# Patient Record
Sex: Male | Born: 1985 | Race: Black or African American | Hispanic: No | Marital: Single | Smoking: Current every day smoker
Health system: Southern US, Community
[De-identification: ages and names within clinical notes are randomized; demographics above are authoritative.]

## PROBLEM LIST (undated history)

## (undated) ENCOUNTER — Emergency Department (HOSPITAL_COMMUNITY): Payer: Self-pay

## (undated) ENCOUNTER — Emergency Department (HOSPITAL_COMMUNITY): Admission: EM | Payer: Self-pay | Source: Home / Self Care

## (undated) DIAGNOSIS — U071 COVID-19: Secondary | ICD-10-CM

## (undated) DIAGNOSIS — F22 Delusional disorders: Secondary | ICD-10-CM

## (undated) DIAGNOSIS — Z765 Malingerer [conscious simulation]: Secondary | ICD-10-CM

## (undated) DIAGNOSIS — Z5900 Homelessness unspecified: Secondary | ICD-10-CM

## (undated) DIAGNOSIS — F99 Mental disorder, not otherwise specified: Secondary | ICD-10-CM

## (undated) DIAGNOSIS — I2699 Other pulmonary embolism without acute cor pulmonale: Secondary | ICD-10-CM

## (undated) DIAGNOSIS — E7800 Pure hypercholesterolemia, unspecified: Secondary | ICD-10-CM

## (undated) DIAGNOSIS — R443 Hallucinations, unspecified: Secondary | ICD-10-CM

## (undated) DIAGNOSIS — F209 Schizophrenia, unspecified: Secondary | ICD-10-CM

---
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
## 2009-07-23 ENCOUNTER — Emergency Department (HOSPITAL_COMMUNITY): Admission: EM | Admit: 2009-07-23 | Discharge: 2009-07-23 | Payer: Self-pay | Admitting: Emergency Medicine

## 2009-08-06 ENCOUNTER — Emergency Department (HOSPITAL_COMMUNITY): Admission: EM | Admit: 2009-08-06 | Discharge: 2009-08-06 | Payer: Self-pay | Admitting: Emergency Medicine

## 2009-08-18 ENCOUNTER — Emergency Department (HOSPITAL_COMMUNITY): Admission: EM | Admit: 2009-08-18 | Discharge: 2009-08-18 | Payer: Self-pay | Admitting: Family Medicine

## 2010-07-22 ENCOUNTER — Emergency Department (HOSPITAL_BASED_OUTPATIENT_CLINIC_OR_DEPARTMENT_OTHER): Admission: EM | Admit: 2010-07-22 | Discharge: 2010-07-22 | Payer: Self-pay | Admitting: Emergency Medicine

## 2010-07-24 ENCOUNTER — Emergency Department (HOSPITAL_COMMUNITY): Admission: EM | Admit: 2010-07-24 | Discharge: 2010-07-24 | Payer: Self-pay | Admitting: Emergency Medicine

## 2011-02-11 LAB — URINALYSIS, ROUTINE W REFLEX MICROSCOPIC
Bilirubin Urine: NEGATIVE
Glucose, UA: NEGATIVE mg/dL
Hgb urine dipstick: NEGATIVE
Ketones, ur: NEGATIVE mg/dL
Nitrite: NEGATIVE
Protein, ur: NEGATIVE mg/dL
Specific Gravity, Urine: 1.029 (ref 1.005–1.030)
Urobilinogen, UA: 0.2 mg/dL (ref 0.0–1.0)
pH: 6 (ref 5.0–8.0)

## 2011-02-11 LAB — POCT TOXICOLOGY PANEL
Opiates: POSITIVE
Tetrahydrocannabinol: POSITIVE

## 2011-02-11 LAB — CBC
HCT: 39.6 % (ref 39.0–52.0)
Hemoglobin: 13.3 g/dL (ref 13.0–17.0)
MCH: 29 pg (ref 26.0–34.0)
MCHC: 33.5 g/dL (ref 30.0–36.0)
MCV: 86.8 fL (ref 78.0–100.0)
Platelets: 264 10*3/uL (ref 150–400)
RBC: 4.57 MIL/uL (ref 4.22–5.81)
RDW: 12.1 % (ref 11.5–15.5)
WBC: 4.1 10*3/uL (ref 4.0–10.5)

## 2011-02-11 LAB — COMPREHENSIVE METABOLIC PANEL
ALT: 24 U/L (ref 0–53)
AST: 23 U/L (ref 0–37)
Albumin: 4.1 g/dL (ref 3.5–5.2)
Alkaline Phosphatase: 53 U/L (ref 39–117)
BUN: 10 mg/dL (ref 6–23)
CO2: 22 mEq/L (ref 19–32)
Calcium: 8.7 mg/dL (ref 8.4–10.5)
Chloride: 109 mEq/L (ref 96–112)
Creatinine, Ser: 1.1 mg/dL (ref 0.4–1.5)
GFR calc Af Amer: >60 mL/min (ref >60)
GFR calc non Af Amer: >60 mL/min (ref >60)
Glucose, Bld: 90 mg/dL (ref 70–99)
Potassium: 4 mEq/L (ref 3.5–5.1)
Sodium: 143 mEq/L (ref 135–145)
Total Bilirubin: 0.6 mg/dL (ref 0.3–1.2)
Total Protein: 7.4 g/dL (ref 6.0–8.3)

## 2011-02-11 LAB — DIFFERENTIAL
Basophils Absolute: 0 10*3/uL (ref 0.0–0.1)
Basophils Relative: 1 % (ref 0–1)
Eosinophils Absolute: 0.2 10*3/uL (ref 0.0–0.7)
Eosinophils Relative: 5 % (ref 0–5)
Lymphocytes Relative: 48 % — ABNORMAL HIGH (ref 12–46)
Lymphs Abs: 2 10*3/uL (ref 0.7–4.0)
Monocytes Absolute: 0.6 10*3/uL (ref 0.1–1.0)
Monocytes Relative: 15 % — ABNORMAL HIGH (ref 3–12)
Neutro Abs: 1.3 10*3/uL — ABNORMAL LOW (ref 1.7–7.7)
Neutrophils Relative %: 31 % — ABNORMAL LOW (ref 43–77)

## 2014-01-01 ENCOUNTER — Encounter (HOSPITAL_COMMUNITY): Payer: Self-pay | Admitting: Emergency Medicine

## 2014-01-01 ENCOUNTER — Inpatient Hospital Stay (HOSPITAL_COMMUNITY)
Admission: EM | Admit: 2014-01-01 | Discharge: 2014-01-03 | DRG: 176 | Disposition: A | Discharge status: Home / Self Care | Payer: Medicaid Other | Attending: Family Medicine | Admitting: Family Medicine

## 2014-01-01 DIAGNOSIS — F17200 Nicotine dependence, unspecified, uncomplicated: Secondary | ICD-10-CM | POA: Diagnosis present

## 2014-01-01 DIAGNOSIS — I2699 Other pulmonary embolism without acute cor pulmonale: Secondary | ICD-10-CM

## 2014-01-01 DIAGNOSIS — R109 Unspecified abdominal pain: Secondary | ICD-10-CM | POA: Diagnosis present

## 2014-01-01 DIAGNOSIS — F1210 Cannabis abuse, uncomplicated: Secondary | ICD-10-CM | POA: Diagnosis present

## 2014-01-01 HISTORY — DX: Mental disorder, not otherwise specified: F99

## 2014-01-01 LAB — COMPREHENSIVE METABOLIC PANEL
ALBUMIN: 3.8 g/dL (ref 3.5–5.2)
ALK PHOS: 56 U/L (ref 39–117)
ALT: 19 U/L (ref 0–53)
AST: 19 U/L (ref 0–37)
BILIRUBIN TOTAL: 0.3 mg/dL (ref 0.3–1.2)
BUN: 10 mg/dL (ref 6–23)
CHLORIDE: 100 meq/L (ref 96–112)
CO2: 27 mEq/L (ref 19–32)
Calcium: 8.9 mg/dL (ref 8.4–10.5)
Creatinine, Ser: 1.02 mg/dL (ref 0.50–1.35)
GFR calc Af Amer: >90 mL/min (ref >90)
GFR calc non Af Amer: >90 mL/min (ref >90)
Glucose, Bld: 78 mg/dL (ref 70–99)
POTASSIUM: 4.1 meq/L (ref 3.7–5.3)
SODIUM: 141 meq/L (ref 137–147)
Total Protein: 7.7 g/dL (ref 6.0–8.3)

## 2014-01-01 LAB — CBC WITH DIFFERENTIAL/PLATELET
BASOS ABS: 0 10*3/uL (ref 0.0–0.1)
BASOS PCT: 1 % (ref 0–1)
Eosinophils Absolute: 0.2 10*3/uL (ref 0.0–0.7)
Eosinophils Relative: 4 % (ref 0–5)
HCT: 38.4 % — ABNORMAL LOW (ref 39.0–52.0)
HEMOGLOBIN: 13 g/dL (ref 13.0–17.0)
Lymphocytes Relative: 19 % (ref 12–46)
Lymphs Abs: 1.2 10*3/uL (ref 0.7–4.0)
MCH: 28.9 pg (ref 26.0–34.0)
MCHC: 33.9 g/dL (ref 30.0–36.0)
MCV: 85.3 fL (ref 78.0–100.0)
MONOS PCT: 13 % — AB (ref 3–12)
Monocytes Absolute: 0.8 10*3/uL (ref 0.1–1.0)
NEUTROS ABS: 4.1 10*3/uL (ref 1.7–7.7)
NEUTROS PCT: 64 % (ref 43–77)
PLATELETS: 275 10*3/uL (ref 150–400)
RBC: 4.5 MIL/uL (ref 4.22–5.81)
RDW: 12.4 % (ref 11.5–15.5)
WBC: 6.4 10*3/uL (ref 4.0–10.5)

## 2014-01-01 LAB — URINALYSIS, ROUTINE W REFLEX MICROSCOPIC
Bilirubin Urine: NEGATIVE
Glucose, UA: NEGATIVE mg/dL
Hgb urine dipstick: NEGATIVE
KETONES UR: NEGATIVE mg/dL
LEUKOCYTES UA: NEGATIVE
NITRITE: NEGATIVE
PH: 6 (ref 5.0–8.0)
Protein, ur: NEGATIVE mg/dL
SPECIFIC GRAVITY, URINE: 1.025 (ref 1.005–1.030)
Urobilinogen, UA: 1 mg/dL (ref 0.0–1.0)

## 2014-01-01 NOTE — ED Notes (Signed)
Pt reports L flank pain starting 2 hours ago; denies n/v/dysuria; started suddenly

## 2014-01-02 ENCOUNTER — Encounter (HOSPITAL_COMMUNITY): Payer: Self-pay | Admitting: Radiology

## 2014-01-02 ENCOUNTER — Emergency Department (HOSPITAL_COMMUNITY): Payer: Medicaid Other

## 2014-01-02 DIAGNOSIS — R109 Unspecified abdominal pain: Secondary | ICD-10-CM

## 2014-01-02 DIAGNOSIS — I2699 Other pulmonary embolism without acute cor pulmonale: Principal | ICD-10-CM

## 2014-01-02 DIAGNOSIS — R52 Pain, unspecified: Secondary | ICD-10-CM

## 2014-01-02 LAB — ANTITHROMBIN III: ANTITHROMB III FUNC: 102 % (ref 75–120)

## 2014-01-02 LAB — RAPID URINE DRUG SCREEN, HOSP PERFORMED
AMPHETAMINES: NOT DETECTED
BARBITURATES: NOT DETECTED
Benzodiazepines: NOT DETECTED
Cocaine: NOT DETECTED
Opiates: NOT DETECTED
TETRAHYDROCANNABINOL: POSITIVE — AB

## 2014-01-02 LAB — PROTIME-INR
INR: 0.96 (ref 0.00–1.49)
Prothrombin Time: 12.6 seconds (ref 11.6–15.2)

## 2014-01-02 LAB — HOMOCYSTEINE: HOMOCYSTEINE-NORM: 9.1 umol/L (ref 4.0–15.4)

## 2014-01-02 MED ORDER — RIVAROXABAN 15 MG PO TABS
15.0000 mg | ORAL_TABLET | Freq: Two times a day (BID) | ORAL | Status: DC
  Administered 2014-01-02 – 2014-01-03 (×2): 15 mg via ORAL
  Filled 2014-01-02 (×4): qty 1

## 2014-01-02 MED ORDER — HYDROMORPHONE HCL PF 1 MG/ML IJ SOLN
INTRAMUSCULAR | Status: AC
  Filled 2014-01-02: qty 1

## 2014-01-02 MED ORDER — ONDANSETRON HCL 4 MG/2ML IJ SOLN: 4.0000 mg | Freq: Three times a day (TID) | INTRAMUSCULAR | Status: DC | PRN

## 2014-01-02 MED ORDER — HYDROCODONE-ACETAMINOPHEN 5-325 MG PO TABS
1.0000 | ORAL_TABLET | ORAL | Status: DC | PRN
  Administered 2014-01-02 – 2014-01-03 (×5): 1 via ORAL
  Filled 2014-01-02 (×5): qty 1

## 2014-01-02 MED ORDER — SODIUM CHLORIDE 0.9 % IJ SOLN
3.0000 mL | Freq: Two times a day (BID) | INTRAMUSCULAR | Status: DC
  Administered 2014-01-02 (×2): 3 mL via INTRAVENOUS

## 2014-01-02 MED ORDER — SODIUM CHLORIDE 0.9 % IJ SOLN
3.0000 mL | Freq: Two times a day (BID) | INTRAMUSCULAR | Status: DC
  Administered 2014-01-02: 3 mL via INTRAVENOUS

## 2014-01-02 MED ORDER — ENOXAPARIN SODIUM 80 MG/0.8ML ~~LOC~~ SOLN
80.0000 mg | Freq: Once | SUBCUTANEOUS | Status: AC
  Administered 2014-01-02: 80 mg via SUBCUTANEOUS
  Filled 2014-01-02: qty 0.8

## 2014-01-02 MED ORDER — HYDROMORPHONE HCL PF 1 MG/ML IJ SOLN
1.0000 mg | Freq: Once | INTRAMUSCULAR | Status: AC
  Administered 2014-01-02: 1 mg via INTRAVENOUS

## 2014-01-02 MED ORDER — RIVAROXABAN 20 MG PO TABS: 20.0000 mg | ORAL_TABLET | Freq: Every day | ORAL | Status: DC

## 2014-01-02 MED ORDER — ENOXAPARIN SODIUM 80 MG/0.8ML ~~LOC~~ SOLN
1.0000 mg/kg | Freq: Two times a day (BID) | SUBCUTANEOUS | Status: DC
  Filled 2014-01-02: qty 0.8

## 2014-01-02 MED ORDER — OXYCODONE-ACETAMINOPHEN 5-325 MG PO TABS
2.0000 | ORAL_TABLET | Freq: Once | ORAL | Status: AC
  Administered 2014-01-02: 2 via ORAL
  Filled 2014-01-02: qty 2

## 2014-01-02 MED ORDER — IOHEXOL 350 MG/ML SOLN
80.0000 mL | Freq: Once | INTRAVENOUS | Status: AC | PRN
  Administered 2014-01-02: 74 mL via INTRAVENOUS

## 2014-01-02 MED ORDER — SODIUM CHLORIDE 0.9 % IJ SOLN: 3.0000 mL | INTRAMUSCULAR | Status: DC | PRN

## 2014-01-02 MED ORDER — SODIUM CHLORIDE 0.9 % IV SOLN: 250.0000 mL | INTRAVENOUS | Status: DC | PRN

## 2014-01-02 MED ORDER — BENZTROPINE MESYLATE 1 MG PO TABS
1.0000 mg | ORAL_TABLET | Freq: Every day | ORAL | Status: DC
  Administered 2014-01-02: 1 mg via ORAL
  Filled 2014-01-02 (×3): qty 1

## 2014-01-02 MED ORDER — HYDROMORPHONE HCL PF 1 MG/ML IJ SOLN: 1.0000 mg | INTRAMUSCULAR | Status: DC | PRN

## 2014-01-02 NOTE — Discharge Instructions (Signed)
Information on my medicine - XARELTO (rivaroxaban)  This medication education was reviewed with me or my healthcare representative as part of my discharge preparation.  The pharmacist that spoke with me during my hospital stay was:  Jo-Anne Kluth, Judie BonusKimberly Ballard, [facility]  WHY WAS XARELTO PRESCRIBED FOR YOU? Xarelto was prescribed to treat blood clots that may have been found in the veins of your legs (deep vein thrombosis) or in your lungs (pulmonary embolism) and to reduce the risk of them occurring again.  What do you need to know about Xarelto? The starting dose is one 15 mg tablet taken TWICE daily with food for the FIRST 21 DAYS then on (enter date)  01/24/14  the dose is changed to one 20 mg tablet taken ONCE A DAY with your evening meal.  DO NOT stop taking Xarelto without talking to the health care provider who prescribed the medication.  Refill your prescription for 20 mg tablets before you run out.  After discharge, you should have regular check-up appointments with your healthcare provider that is prescribing your Xarelto.  In the future your dose may need to be changed if your kidney function changes by a significant amount.  What do you do if you miss a dose? If you are taking Xarelto TWICE DAILY and you miss a dose, take it as soon as you remember. You may take two 15 mg tablets (total 30 mg) at the same time then resume your regularly scheduled 15 mg twice daily the next day.  If you are taking Xarelto ONCE DAILY and you miss a dose, take it as soon as you remember on the same day then continue your regularly scheduled once daily regimen the next day. Do not take two doses of Xarelto at the same time.   Important Safety Information Xarelto is a blood thinner medicine that can cause bleeding. You should call your healthcare provider right away if you experience any of the following:   Bleeding from an injury or your nose that does not stop.   Unusual colored urine (red or dark  brown) or unusual colored stools (red or black).   Unusual bruising for unknown reasons.   A serious fall or if you hit your head (even if there is no bleeding).  Some medicines may interact with Xarelto and might increase your risk of bleeding while on Xarelto. To help avoid this, consult your healthcare provider or pharmacist prior to using any new prescription or non-prescription medications, including herbals, vitamins, non-steroidal anti-inflammatory drugs (NSAIDs) and supplements.  This website has more information on Xarelto: [email or web address].[email or web address].

## 2014-01-02 NOTE — ED Notes (Signed)
Pt returned from radiology.

## 2014-01-02 NOTE — Care Management Note (Signed)
    Page 1 of 1   01/03/2014     2:27:55 PM   CARE MANAGEMENT NOTE 01/03/2014  Patient:  Cole Barrett,Cole Barrett   Account Number:  [id number]  Date Initiated:  01/02/2014  Documentation initiated by:  Nichalas Coin  Subjective/Objective Assessment:   PT ADM ON 01/01/14 WITH BILATERAL PULMONARY EMBOLI.  PTA, PT INDEPENDENT OF ADLS.     Action/Plan:   PT TO DC ON XARELTO.  HE HAS NO INSURANCE.  WILL GIVE 30 DAY FREE TRIAL CARD FOR XARELTO, AND ENROLL IN PATIENT ASSISTANCE PROGRAM.   Anticipated DC Date:  01/03/2014   Anticipated DC Plan:  HOME/SELF CARE      DC Planning Services  CM consult  Medication Assistance  [facility]  Follow-up appt scheduled      Choice offered to / List presented to:             Status of service:  Completed, signed off Medicare Important Message given?   (If response is "NO", the following Medicare IM given date fields will be blank) Date Medicare IM given:   Date Additional Medicare IM given:    Discharge Disposition:  HOME/SELF CARE  Per UR Regulation:  Reviewed for med. necessity/level of care/duration of stay  If discussed at Long Length of Stay Meetings, dates discussed:    Comments:  01/03/14 Tangi Shroff,RN,BSN [phone] PT STATES HE HAS ACTIVE MEDICAID, AND I HAVE VERIFIED THIS WITH FINANCIAL COUNSELOR.  PT WILL NOT NEED TO BE ENROLLED IN PATIENT ASSISTANCE PROGRAM; HE WILL BE ABLE TO OBTAIN XARELTO FOR $3 WITH MEDICAID BENEFITS.  PT ENCOURAGED TO STILL KEEP [facility] FOLLOW UP APPOINTMENT AS SCHEDULED.  PT GIVEN 30 DAY FREE TRIAL CARD FOR XARELTO AFTER ACTIVATING.  01/02/14 Treyvone Chelf,RN,BSN [phone] [facility]PT MADE AT [facility] FOR [facility], FEB 14 AT 10:00.

## 2014-01-02 NOTE — ED Notes (Signed)
Pt reports smoking marijuana yesterday.

## 2014-01-02 NOTE — ED Notes (Signed)
David, MD at bedside.  

## 2014-01-02 NOTE — Progress Notes (Signed)
Patient seen and evaluated earlier this a.m. by my associate. Found to have bilateral acute pulmonary embolisms including an occlusive segmental clot in the left lower lobe that causes lung infarct on CT angiogram.  Patient started on Lovenox. I have discussed Coumadin versus xarelto as option for treatment. Patient has opted for xarelto and is aware of the benefits as well as the risks of both medications. He currently does not have a primary care physician and states that it would be difficult for him to have periodic INR checks if on Coumadin.  We'll consult at pharmacy for xarelto administration.  Plan on discharging on xarelto most likely next a.m. to 01/03/2014

## 2014-01-02 NOTE — Progress Notes (Signed)
ANTICOAGULATION CONSULT NOTE - Initial Consult  Pharmacy Consult for Xarelto Indication: pulmonary embolus  No Known Allergies  Patient Measurements: Height: 5\' 9"  (175.3 cm) Weight: 179 lb (81.194 kg) IBW/kg (Calculated) : 70.7  Vital Signs: Temp: 98.9 F (37.2 C) (02/05 0603) Temp src: Oral (02/05 0603) BP: 109/63 mmHg (02/05 0603) Pulse Rate: 69 (02/05 0603)  Labs:  Recent Labs  01/01/14 2055 01/02/14 0429  HGB 13.0  --   HCT 38.4*  --   PLT 275  --   LABPROT  --  12.6  INR  --  0.96  CREATININE 1.02  --     Estimated Creatinine Clearance: 105.9 ml/min (by C-G formula based on Cr of 1.02).   Medical History: Past Medical History  Diagnosis Date  . Mental disorder     Medications:  Scheduled:  . enoxaparin (LOVENOX) injection  1 mg/kg Subcutaneous BID  . HYDROmorphone      . sodium chloride  3 mL Intravenous Q12H  . sodium chloride  3 mL Intravenous Q12H    Assessment: 28 yo M presented to the ED overnight with flank pain.  Found to have bilateral PE.  Pt denies recent trauma, surgery, travel, or any other known risk factors.  Concern for hypercoagulable state with labs drawn in ED and in process.  Pt was imitated on Lovenox 1mg /kg q12h now with plans to transition to Xarelto for PE treatment.  Goal of Therapy:  Therapeutic Anticoagulation   Plan:  D/C Lovenox now. Start Xarelto tonight with the evening meal.  Plan Xarelto 15mg  BID x 21 days, then 20mg  daily with dinner. Will follow-up for patient education as mental status improves.  Toys 'R' UsKimberly Shykeem Resurreccion, Pharm.D., BCPS Clinical Pharmacist Pager [phone] 01/02/2014 1:53 PM

## 2014-01-02 NOTE — ED Notes (Signed)
Steinl, MD at bedside. 

## 2014-01-02 NOTE — ED Provider Notes (Addendum)
CSN: [id number]     Arrival date & time 01/01/14  2045 History   First MD Initiated Contact with Patient 01/02/14 0031     Chief Complaint  Patient presents with  . Flank Pain   (Consider location/radiation/quality/duration/timing/severity/associated sxs/prior Treatment) Patient is a 28 y.o. male presenting with flank pain. The history is provided by the patient and the EMS personnel.  Flank Pain Pertinent negatives include no chest pain, no abdominal pain, no headaches and no shortness of breath.  pt c/o acute onset left flank pain at rest tonight. Constant, dull, mod-severe, non radiating. No hx same pain. No hx kidney stones. No dysuria or hematuria. Denies injury or strain to area. No fever or chills. Has had normal appetite. No nvd. No constipation.      History reviewed. No pertinent past medical history. History reviewed. No pertinent past surgical history. History reviewed. No pertinent family history. History  Substance Use Topics  . Smoking status: Current Every Day Smoker -- 0.30 packs/day    Types: Cigarettes  . Smokeless tobacco: Not on file  . Alcohol Use: Yes     Comment: 1 40 oz per day    Review of Systems  Constitutional: Negative for fever and chills.  HENT: Negative for sore throat.   Eyes: Negative for redness.  Respiratory: Negative for shortness of breath.   Cardiovascular: Negative for chest pain.  Gastrointestinal: Negative for vomiting, abdominal pain, diarrhea and constipation.  Genitourinary: Positive for flank pain. Negative for dysuria.  Musculoskeletal: Positive for back pain. Negative for neck pain.  Skin: Negative for rash.  Neurological: Negative for headaches.  Hematological: Does not bruise/bleed easily.  Psychiatric/Behavioral: Negative for confusion.    Allergies  Review of patient's allergies indicates no known allergies.  Home Medications   Current Outpatient Rx  Name  Route  Sig  Dispense  Refill  . PRESCRIPTION MEDICATION  Intramuscular   Inject into the muscle every 14 (fourteen) days. Receives shot fromReceiv Dr every 2 weeks for "voices in head"          BP 125/74  Pulse 72  Temp(Src) 98.4 F (36.9 C) (Oral)  Resp 22  SpO2 100% Physical Exam  Nursing note and vitals reviewed. Constitutional: He is oriented to person, place, and time. He appears well-developed and well-nourished. No distress.  HENT:  Head: Atraumatic.  Mouth/Throat: Oropharynx is clear and moist.  Eyes: Conjunctivae are normal. No scleral icterus.  Neck: Neck supple. No tracheal deviation present.  Cardiovascular: Normal rate, regular rhythm, normal heart sounds and intact distal pulses.   Pulmonary/Chest: Effort normal and breath sounds normal. No accessory muscle usage. No respiratory distress.  Abdominal: Soft. Bowel sounds are normal. He exhibits no distension and no mass. There is no tenderness. There is no rebound and no guarding.  Genitourinary:  No cva tenderness  Musculoskeletal: Normal range of motion. He exhibits no edema and no tenderness.  CTLS spine, non tender, aligned, no step off.   Neurological: He is alert and oriented to person, place, and time.  Skin: Skin is warm and dry.  Psychiatric: He has a normal mood and affect.    ED Course  Procedures (including critical care time)   Results for orders placed during the hospital encounter of 01/01/14  URINALYSIS, ROUTINE W REFLEX MICROSCOPIC      Result Value Range   Color, Urine YELLOW  YELLOW   APPearance CLEAR  CLEAR   Specific Gravity, Urine 1.025  1.005 - 1.030   pH 6.0  5.0 - 8.0   Glucose, UA NEGATIVE  NEGATIVE mg/dL   Hgb urine dipstick NEGATIVE  NEGATIVE   Bilirubin Urine NEGATIVE  NEGATIVE   Ketones, ur NEGATIVE  NEGATIVE mg/dL   Protein, ur NEGATIVE  NEGATIVE mg/dL   Urobilinogen, UA 1.0  0.0 - 1.0 mg/dL   Nitrite NEGATIVE  NEGATIVE   Leukocytes, UA NEGATIVE  NEGATIVE  CBC WITH DIFFERENTIAL      Result Value Range   WBC 6.4  4.0 - 10.5  K/uL   RBC 4.50  4.22 - 5.81 MIL/uL   Hemoglobin 13.0  13.0 - 17.0 g/dL   HCT 16.1 (*) 09.6 - 04.5 %   MCV 85.3  78.0 - 100.0 fL   MCH 28.9  26.0 - 34.0 pg   MCHC 33.9  30.0 - 36.0 g/dL   RDW 40.9  81.1 - 91.4 %   Platelets 275  150 - 400 K/uL   Neutrophils Relative % 64  43 - 77 %   Neutro Abs 4.1  1.7 - 7.7 K/uL   Lymphocytes Relative 19  12 - 46 %   Lymphs Abs 1.2  0.7 - 4.0 K/uL   Monocytes Relative 13 (*) 3 - 12 %   Monocytes Absolute 0.8  0.1 - 1.0 K/uL   Eosinophils Relative 4  0 - 5 %   Eosinophils Absolute 0.2  0.0 - 0.7 K/uL   Basophils Relative 1  0 - 1 %   Basophils Absolute 0.0  0.0 - 0.1 K/uL  COMPREHENSIVE METABOLIC PANEL      Result Value Range   Sodium 141  137 - 147 mEq/L   Potassium 4.1  3.7 - 5.3 mEq/L   Chloride 100  96 - 112 mEq/L   CO2 27  19 - 32 mEq/L   Glucose, Bld 78  70 - 99 mg/dL   BUN 10  6 - 23 mg/dL   Creatinine, Ser 7.82  0.50 - 1.35 mg/dL   Calcium 8.9  8.4 - 95.6 mg/dL   Total Protein 7.7  6.0 - 8.3 g/dL   Albumin 3.8  3.5 - 5.2 g/dL   AST 19  0 - 37 U/L   ALT 19  0 - 53 U/L   Alkaline Phosphatase 56  39 - 117 U/L   Total Bilirubin 0.3  0.3 - 1.2 mg/dL   GFR calc non Af Amer >90  >90 mL/min   GFR calc Af Amer >90  >90 mL/min  URINE RAPID DRUG SCREEN (HOSP PERFORMED)      Result Value Range   Opiates NONE DETECTED  NONE DETECTED   Cocaine NONE DETECTED  NONE DETECTED   Benzodiazepines NONE DETECTED  NONE DETECTED   Amphetamines NONE DETECTED  NONE DETECTED   Tetrahydrocannabinol POSITIVE (*) NONE DETECTED   Barbiturates NONE DETECTED  NONE DETECTED   Ct Abdomen Pelvis Wo Contrast  01/02/2014   CLINICAL DATA:  Left flank pain starting 2 hr ago  EXAM: CT ABDOMEN AND PELVIS WITHOUT CONTRAST  TECHNIQUE: Multidetector CT imaging of the abdomen and pelvis was performed following the standard protocol without intravenous contrast.  COMPARISON:  None.  FINDINGS: BODY WALL: Unremarkable.  LOWER CHEST: Focal rounded opacity in the posterior  basilar segment left lower lobe, with pleural thickening or small volume pleural effusion.  ABDOMEN/PELVIS:  Liver: No focal abnormality.  Biliary: No evidence of biliary obstruction or stone.  Pancreas: Unremarkable.  Spleen: Unremarkable.  Adrenals: Unremarkable.  Kidneys and ureters: No hydronephrosis or stone.  Bladder: Unremarkable.  Reproductive: Unremarkable.  Bowel: No obstruction. Appendix not clearly resolved, but there is no pericecal inflammation.  Retroperitoneum: No mass or adenopathy.  Peritoneum: No free fluid or gas.  Vascular: No acute abnormality.  OSSEOUS: No acute abnormalities.  IMPRESSION: 1. Pleural based airspace disease in the left lower lobe. If there are pulmonary infectious symptoms, the finding is consistent with pneumonia. If not, lung infarct should then considered. 2. Negative intra-abdominal imaging.   Electronically Signed   By: Tiburcio PeaJonathan  Watts M.D.   On: 01/02/2014 01:32   Ct Angio Chest Pe W/cm &/or Wo Cm  01/02/2014   CLINICAL DATA:  Abnormal abdominal CT.  Rule out pulmonary embolism.  EXAM: CT ANGIOGRAPHY CHEST WITH CONTRAST  TECHNIQUE: Multidetector CT imaging of the chest was performed using the standard protocol during bolus administration of intravenous contrast. Multiplanar CT image reconstructions including MIPs were obtained to evaluate the vascular anatomy.  CONTRAST:  74mL OMNIPAQUE IOHEXOL 350 MG/ML SOLN  COMPARISON:  Abdominal CT from the same day.  FINDINGS: THORACIC INLET/BODY WALL:  No acute abnormality.  MEDIASTINUM:  There is an occlusive thrombus in the proximal posterior basilar segment left lower lobe pulmonary artery, with downstream lung infarct. There is a contralateral embolus in the sub segmental medial segment right middle lobe, nonocclusive.  No evidence of right heart strain. No cardiomegaly and/or pericardial effusion. Negative aorta.  LUNG WINDOWS:  Lung infarct at the left base.  Mild paraseptal emphysema.  UPPER ABDOMEN:  No acute findings.   OSSEOUS:  No acute fracture.  No suspicious lytic or blastic lesions.  Critical Value/emergent results were called by telephone at the time of interpretation on 01/02/2014 at 3:27 AM to Dr. Cathren LaineKEVIN Varonica Siharath , who verbally acknowledged these results.  IMPRESSION: Bilateral acute pulmonary embolism, including an occlusive segmental clot in the left lower lobe that causes lung infarct.   Electronically Signed   By: Tiburcio PeaJonathan  Watts M.D.   On: 01/02/2014 03:29      MDM  Percocet po. Labs. Ct.  Reviewed nursing notes and prior charts for additional history.   Ct positive for pe.   Med service called to admit.  Pt c/o persistent, severe pain left flank.  Dilaudid iv.  Pt w no hx, no risk factors for pe, no travel, immobility, surgery, trauma. Hypercoagulable panel added to labs.   lovenox per pharmacy.        Suzi RootsKevin E Mylisa Brunson, MD 01/02/14 [phone]

## 2014-01-02 NOTE — Progress Notes (Signed)
Utilization Review Completed.Cole Barrett T2/03/2014  

## 2014-01-02 NOTE — ED Notes (Signed)
Pt called out stating that he has further questions for the doctor.

## 2014-01-02 NOTE — ED Notes (Signed)
Patient transported to CT 

## 2014-01-02 NOTE — Progress Notes (Signed)
RN went in to assess pt. Pt had family at bedside. Family wondering what pt was admitted for. Family informed RN that this is pts baseline confused and forgetful. Family stated pt has "schizophrenia and hears voices".  Family informed RN that pt gets a Risperdal injection every 2 weeks and takes and takes another medication daily. RN notified pharmacy and pharmacy is coming to talk in more detail about patients home medications with family. Family requesting to speak to MD in the am to get more clarity on patients plan. Family also voices concern about pts ability to make decisions independently. Will continue to monitor.   Genevive Biavis, Arlie Posch R, RN

## 2014-01-02 NOTE — H&P (Signed)
Chief Complaint:  Left flank pain  HPI: 28 yo male healthy per his report had sudden onset of left flank pain earlier tonight which was moderate, came to ED.  No fevers.  No cough.  No le edema or swelling.  No recent illnesses.  No recent trauma, traveling, or surgery.  Ct abd pel was done initially concerns for renal stone but that study showed pulmonary infarct.  Subsequently a cta chest was done which shows bilateral pulmonary emboli with segmental LLL infarct.    Review of Systems:  Positive and negative as per HPI otherwise all other systems are negative  Past Medical History: History reviewed. No pertinent past medical history. History reviewed. No pertinent past surgical history.  Medications: Prior to Admission medications   Medication Sig Start Date End Date Taking? Authorizing Provider  PRESCRIPTION MEDICATION Inject into the muscle every 14 (fourteen) days. Receives shot fromReceiv Dr every 2 weeks for "voices in head"   Yes Historical Provider, MD    Allergies:  No Known Allergies  Social History:  reports that he has been smoking Cigarettes.  He has been smoking about 0.30 packs per day. He does not have any smokeless tobacco history on file. He reports that he drinks alcohol. He reports that he uses illicit drugs (Marijuana).  Family History: Brother at age 49 years old died suddenly of heart attack, he did not use drugs, pt reports there was an autopsy done which confirmed a myocardial infarct as cause of death but he is not sure of this.  Physical Exam: Filed Vitals:   01/02/14 0230 01/02/14 0300 01/02/14 0416 01/02/14 0426  BP: 125/70   111/67  Pulse: 72   67  Temp:      TempSrc:      Resp:      Height:  5\' 9"  (1.753 m) 5\' 9"  (1.753 m)   Weight:  81.647 kg (180 lb) 81.647 kg (180 lb)   SpO2: 99%   100%   General appearance: alert, cooperative and no distress Head: Normocephalic, without obvious abnormality, atraumatic Eyes: negative Nose: Nares normal.  Septum midline. Mucosa normal. No drainage or sinus tenderness. Neck: no JVD and supple, symmetrical, trachea midline Lungs: clear to auscultation bilaterally Heart: regular rate and rhythm, S1, S2 normal, no murmur, click, rub or gallop Abdomen: soft, non-tender; bowel sounds normal; no masses,  no organomegaly Extremities: extremities normal, atraumatic, no cyanosis or edema Pulses: 2+ and symmetric Skin: Skin color, texture, turgor normal. No rashes or lesions Neurologic: Grossly normal    Labs on Admission:   Recent Labs  01/01/14 2055  NA 141  K 4.1  CL 100  CO2 27  GLUCOSE 78  BUN 10  CREATININE 1.02  CALCIUM 8.9    Recent Labs  01/01/14 2055  AST 19  ALT 19  ALKPHOS 56  BILITOT 0.3  PROT 7.7  ALBUMIN 3.8    Recent Labs  01/01/14 2055  WBC 6.4  NEUTROABS 4.1  HGB 13.0  HCT 38.4*  MCV 85.3  PLT 275    Radiological Exams on Admission: Ct Abdomen Pelvis Wo Contrast  01/02/2014   CLINICAL DATA:  Left flank pain starting 2 hr ago  EXAM: CT ABDOMEN AND PELVIS WITHOUT CONTRAST  TECHNIQUE: Multidetector CT imaging of the abdomen and pelvis was performed following the standard protocol without intravenous contrast.  COMPARISON:  None.  FINDINGS: BODY WALL: Unremarkable.  LOWER CHEST: Focal rounded opacity in the posterior basilar segment left lower lobe, with pleural thickening or  small volume pleural effusion.  ABDOMEN/PELVIS:  Liver: No focal abnormality.  Biliary: No evidence of biliary obstruction or stone.  Pancreas: Unremarkable.  Spleen: Unremarkable.  Adrenals: Unremarkable.  Kidneys and ureters: No hydronephrosis or stone.  Bladder: Unremarkable.  Reproductive: Unremarkable.  Bowel: No obstruction. Appendix not clearly resolved, but there is no pericecal inflammation.  Retroperitoneum: No mass or adenopathy.  Peritoneum: No free fluid or gas.  Vascular: No acute abnormality.  OSSEOUS: No acute abnormalities.  IMPRESSION: 1. Pleural based airspace disease in the  left lower lobe. If there are pulmonary infectious symptoms, the finding is consistent with pneumonia. If not, lung infarct should then considered. 2. Negative intra-abdominal imaging.   Electronically Signed   By: Tiburcio PeaJonathan  Watts M.D.   On: 01/02/2014 01:32   Ct Angio Chest Pe W/cm &/or Wo Cm  01/02/2014   CLINICAL DATA:  Abnormal abdominal CT.  Rule out pulmonary embolism.  EXAM: CT ANGIOGRAPHY CHEST WITH CONTRAST  TECHNIQUE: Multidetector CT imaging of the chest was performed using the standard protocol during bolus administration of intravenous contrast. Multiplanar CT image reconstructions including MIPs were obtained to evaluate the vascular anatomy.  CONTRAST:  74mL OMNIPAQUE IOHEXOL 350 MG/ML SOLN  COMPARISON:  Abdominal CT from the same day.  FINDINGS: THORACIC INLET/BODY WALL:  No acute abnormality.  MEDIASTINUM:  There is an occlusive thrombus in the proximal posterior basilar segment left lower lobe pulmonary artery, with downstream lung infarct. There is a contralateral embolus in the sub segmental medial segment right middle lobe, nonocclusive.  No evidence of right heart strain. No cardiomegaly and/or pericardial effusion. Negative aorta.  LUNG WINDOWS:  Lung infarct at the left base.  Mild paraseptal emphysema.  UPPER ABDOMEN:  No acute findings.  OSSEOUS:  No acute fracture.  No suspicious lytic or blastic lesions.  Critical Value/emergent results were called by telephone at the time of interpretation on 01/02/2014 at 3:27 AM to Dr. Cathren LaineKEVIN STEINL , who verbally acknowledged these results.  IMPRESSION: Bilateral acute pulmonary embolism, including an occlusive segmental clot in the left lower lobe that causes lung infarct.   Electronically Signed   By: Tiburcio PeaJonathan  Watts M.D.   On: 01/02/2014 03:29    Assessment/Plan 28 yo male presents with left flank pain found to have bilateral pulmonary emboli and left lower lobe infarct appears unprovoked  Principal Problem:   Pulmonary embolism and  infarction-  Full dose lovenox.  Hypercoagulable w/u ordered by edp prior to lovenox administration.  The history of his brother dying at age 30 suddenly is suspicous for underlying hypercoagulable state.  Will need 6 mo to one year of anticoagulation unless hypercoag panel positive then will need lifelong tx.  Would be a good xaralto candidate if insurance present.  Will need to get SW involved to help with f/u at discharge.  Active Problems:   Acute left flank pain as above  Suspect some underlying psychiatric disorder, looking back in 2011 pt had IVC to inpatient psychiatric facility for acute psychosis, and on his med list he reports taking a shot every 2 weeks for  "voices in his head".  Pt denied any medical problems with me, denied taking any medications chronically, and reports he has no health insurance.  Zayne Marovich A 01/02/2014, 4:46 AM

## 2014-01-03 ENCOUNTER — Encounter (HOSPITAL_COMMUNITY): Payer: Self-pay | Admitting: Radiology

## 2014-01-03 ENCOUNTER — Inpatient Hospital Stay (HOSPITAL_COMMUNITY): Payer: Medicaid Other

## 2014-01-03 DIAGNOSIS — I2699 Other pulmonary embolism without acute cor pulmonale: Secondary | ICD-10-CM

## 2014-01-03 DIAGNOSIS — R52 Pain, unspecified: Secondary | ICD-10-CM

## 2014-01-03 DIAGNOSIS — R109 Unspecified abdominal pain: Secondary | ICD-10-CM

## 2014-01-03 LAB — PROTEIN C ACTIVITY: Protein C Activity: 105 % (ref 75–133)

## 2014-01-03 LAB — LUPUS ANTICOAGULANT PANEL
DRVVT INCUBATED 1 1 MIX: 38.5 s (ref <42.9)
DRVVT: 44.2 s — AB (ref <42.9)
LUPUS ANTICOAGULANT: NOT DETECTED
PTT Lupus Anticoagulant: 38.7 secs (ref 28.0–43.0)

## 2014-01-03 LAB — BETA-2-GLYCOPROTEIN I ABS, IGG/M/A
Beta-2 Glyco I IgG: 5 G Units (ref <20)
Beta-2-Glycoprotein I IgA: 8 A Units (ref <20)
Beta-2-Glycoprotein I IgM: 2 M Units (ref <20)

## 2014-01-03 LAB — BASIC METABOLIC PANEL
BUN: 9 mg/dL (ref 6–23)
CO2: 25 meq/L (ref 19–32)
Calcium: 8.4 mg/dL (ref 8.4–10.5)
Chloride: 101 mEq/L (ref 96–112)
Creatinine, Ser: 0.94 mg/dL (ref 0.50–1.35)
GFR calc Af Amer: >90 mL/min (ref >90)
GLUCOSE: 86 mg/dL (ref 70–99)
POTASSIUM: 4.4 meq/L (ref 3.7–5.3)
SODIUM: 138 meq/L (ref 137–147)

## 2014-01-03 LAB — PROTHROMBIN GENE MUTATION

## 2014-01-03 LAB — CARDIOLIPIN ANTIBODIES, IGG, IGM, IGA
ANTICARDIOLIPIN IGG: 9 GPL U/mL — AB (ref <23)
ANTICARDIOLIPIN IGM: 5 [MPL'U]/mL — AB (ref <11)
Anticardiolipin IgA: 16 APL U/mL (ref <22)

## 2014-01-03 LAB — PROTIME-INR
INR: 1.13 (ref 0.00–1.49)
Prothrombin Time: 14.3 seconds (ref 11.6–15.2)

## 2014-01-03 LAB — PROTEIN S ACTIVITY: Protein S Activity: 69 % (ref 69–129)

## 2014-01-03 LAB — CBC
HEMATOCRIT: 35.1 % — AB (ref 39.0–52.0)
HEMOGLOBIN: 11.8 g/dL — AB (ref 13.0–17.0)
MCH: 28.7 pg (ref 26.0–34.0)
MCHC: 33.6 g/dL (ref 30.0–36.0)
MCV: 85.4 fL (ref 78.0–100.0)
Platelets: 257 10*3/uL (ref 150–400)
RBC: 4.11 MIL/uL — AB (ref 4.22–5.81)
RDW: 12.5 % (ref 11.5–15.5)
WBC: 4.3 10*3/uL (ref 4.0–10.5)

## 2014-01-03 LAB — PROTEIN S, TOTAL: Protein S Ag, Total: 83 % (ref 60–150)

## 2014-01-03 LAB — PROTEIN C, TOTAL: Protein C, Total: 76 % (ref 72–160)

## 2014-01-03 MED ORDER — RIVAROXABAN 20 MG PO TABS: 20.0000 mg | ORAL_TABLET | Freq: Every day | ORAL | Status: DC

## 2014-01-03 MED ORDER — RIVAROXABAN (XARELTO) VTE STARTER PACK (15 & 20 MG): ORAL_TABLET | ORAL | Status: DC

## 2014-01-03 MED ORDER — NAPROXEN 500 MG PO TABS: 500.0000 mg | ORAL_TABLET | Freq: Two times a day (BID) | ORAL | Status: DC | PRN

## 2014-01-03 NOTE — Progress Notes (Signed)
Pt discharged at 1440. Discharge instructions given and reviewed several times with pt. He verbalizes understanding. Teaching sheets on DVT given and reviewed verbally with pt. Taken to front entrance via W/C.

## 2014-01-03 NOTE — Discharge Summary (Signed)
Physician Discharge Summary  TU BAYLE WRU:[id number] DOB: November 18, 1986 DOA: 01/01/2014  PCP: No primary provider on file.  Admit date: 01/01/2014 Discharge date: 01/03/2014  Time spent: > 35 minutes  Recommendations for Outpatient Follow-up:  1. Please followup with hypercoagulable workup 2. Continue to recommend THC cessation 3. Continue to ensure patient is taking xarelto as indicated  Discharge Diagnoses:  Principal Problem:   Pulmonary embolism and infarction Active Problems:   Acute left flank pain   Pulmonary embolism, bilateral   Discharge Condition: Stable  Diet recommendation: Regular  Filed Weights   01/02/14 0300 01/02/14 0416 01/02/14 0603  Weight: 81.647 kg (180 lb) 81.647 kg (180 lb) 81.194 kg (179 lb)    History of present illness:  28 year old African American male with history of illicit drug use (marijuana). Presenting to the ED complaining of left flank pain. On imaging study was found to have pulmonary embolism.  Hospital Course:  Pulmonary embolism - Was initially treated with Lovenox and successfully transitioned to xarelto. - Discussed benefits of xarelto versus Coumadin. Patient preferred xarelto. Discussed that xarelto cannot be reversed whereas Coumadin could be reversed but Coumadin requires more frequent office visits.  - Will discharge on xarelto starter pack - Will discharge with naproxen for pain control. Avoiding opioids given history of marijuana use   Procedures:  Imaging study is reported below  Consultations:  None  Discharge Exam: Filed Vitals:   01/03/14 0450  BP: 113/63  Pulse: 69  Temp: 98.3 F (36.8 C)  Resp: 20    General: Pt in NAD, Alert and awake Cardiovascular: RRR, no MRG Respiratory: No wheezes, breathing comfortably on room air, breath sounds BL  Discharge Instructions  Discharge Orders   Future Appointments Provider Department Dept Phone   01/11/2014 10:00 AM [facility] Covering Provider [facility] [phone]   Future Orders Complete By Expires   Call MD for:  difficulty breathing, headache or visual disturbances  As directed    Call MD for:  temperature >100.4  As directed    Diet - low sodium heart healthy  As directed    Diet - low sodium heart healthy  As directed    Discharge instructions  As directed    Comments:     Please be sure to follow up with primary care physician in 1-2 weeks or sooner should any new concerns arise.  Also avoid smoking   Increase activity slowly  As directed    Increase activity slowly  As directed        Medication List         benztropine 1 MG tablet  Commonly known as:  COGENTIN  Take 1 mg by mouth at bedtime.     naproxen 500 MG tablet  Commonly known as:  NAPROSYN  Take 1 tablet (500 mg total) by mouth 2 (two) times daily as needed for mild pain or moderate pain.     risperiDONE microspheres 50 MG injection  Commonly known as:  RISPERDAL CONSTA  Inject 50 mg into the muscle every 14 (fourteen) days.     Rivaroxaban 15 & 20 MG Tbpk  Commonly known as:  XARELTO STARTER PACK  Take as directed on package: Start with one 15mg  tablet by mouth twice a day with food. On Day 22, switch to one 20mg  tablet once a day with food.       Allergies  Allergen Reactions  . Haldol [Haloperidol]        Follow-up Information  Follow up with [facility]     On 01/11/2014. (10:00; Please bring photo ID and all medications you are currently taking.  )    Contact information:   [address] [place] [id number] [phone]       The results of significant diagnostics from this hospitalization (including imaging, microbiology, ancillary and laboratory) are listed below for reference.    Significant Diagnostic Studies: Ct Abdomen Pelvis Wo Contrast  01/02/2014   CLINICAL DATA:  Left flank pain starting 2 hr ago  EXAM: CT ABDOMEN AND PELVIS WITHOUT CONTRAST  TECHNIQUE:  Multidetector CT imaging of the abdomen and pelvis was performed following the standard protocol without intravenous contrast.  COMPARISON:  None.  FINDINGS: BODY WALL: Unremarkable.  LOWER CHEST: Focal rounded opacity in the posterior basilar segment left lower lobe, with pleural thickening or small volume pleural effusion.  ABDOMEN/PELVIS:  Liver: No focal abnormality.  Biliary: No evidence of biliary obstruction or stone.  Pancreas: Unremarkable.  Spleen: Unremarkable.  Adrenals: Unremarkable.  Kidneys and ureters: No hydronephrosis or stone.  Bladder: Unremarkable.  Reproductive: Unremarkable.  Bowel: No obstruction. Appendix not clearly resolved, but there is no pericecal inflammation.  Retroperitoneum: No mass or adenopathy.  Peritoneum: No free fluid or gas.  Vascular: No acute abnormality.  OSSEOUS: No acute abnormalities.  IMPRESSION: 1. Pleural based airspace disease in the left lower lobe. If there are pulmonary infectious symptoms, the finding is consistent with pneumonia. If not, lung infarct should then considered. 2. Negative intra-abdominal imaging.   Electronically Signed   By: Tiburcio PeaJonathan  Watts M.D.   On: 01/02/2014 01:32   Ct Head Wo Contrast  01/03/2014   CLINICAL DATA:  Altered mental status.  Mental disorder.  EXAM: CT HEAD WITHOUT CONTRAST  TECHNIQUE: Contiguous axial images were obtained from the base of the skull through the vertex without intravenous contrast.  COMPARISON:  None.  FINDINGS: Ventricles are normal in size and configuration. There are no parenchymal masses or mass effect. There are no areas of abnormal parenchymal attenuation. No evidence of a cortical infarct.  There are no extra-axial masses or abnormal fluid collections.  There is no intracranial hemorrhage.  No skull lesion. The visualized sinuses and mastoid air cells are clear.  IMPRESSION: Normal unenhanced CT scan of the brain.   Electronically Signed   By: Amie Portlandavid  Ormond M.D.   On: 01/03/2014 11:21   Ct Angio Chest  Pe W/cm &/or Wo Cm  01/02/2014   CLINICAL DATA:  Abnormal abdominal CT.  Rule out pulmonary embolism.  EXAM: CT ANGIOGRAPHY CHEST WITH CONTRAST  TECHNIQUE: Multidetector CT imaging of the chest was performed using the standard protocol during bolus administration of intravenous contrast. Multiplanar CT image reconstructions including MIPs were obtained to evaluate the vascular anatomy.  CONTRAST:  74mL OMNIPAQUE IOHEXOL 350 MG/ML SOLN  COMPARISON:  Abdominal CT from the same day.  FINDINGS: THORACIC INLET/BODY WALL:  No acute abnormality.  MEDIASTINUM:  There is an occlusive thrombus in the proximal posterior basilar segment left lower lobe pulmonary artery, with downstream lung infarct. There is a contralateral embolus in the sub segmental medial segment right middle lobe, nonocclusive.  No evidence of right heart strain. No cardiomegaly and/or pericardial effusion. Negative aorta.  LUNG WINDOWS:  Lung infarct at the left base.  Mild paraseptal emphysema.  UPPER ABDOMEN:  No acute findings.  OSSEOUS:  No acute fracture.  No suspicious lytic or blastic lesions.  Critical Value/emergent results were called by telephone  at the time of interpretation on 01/02/2014 at 3:27 AM to Dr. Cathren Laine , who verbally acknowledged these results.  IMPRESSION: Bilateral acute pulmonary embolism, including an occlusive segmental clot in the left lower lobe that causes lung infarct.   Electronically Signed   By: Tiburcio Pea M.D.   On: 01/02/2014 03:29    Microbiology: No results found for this or any previous visit (from the past 240 hour(s)).   Labs: Basic Metabolic Panel:  Recent Labs Lab 01/01/14 2055 01/03/14 0320  NA 141 138  K 4.1 4.4  CL 100 101  CO2 27 25  GLUCOSE 78 86  BUN 10 9  CREATININE 1.02 0.94  CALCIUM 8.9 8.4   Liver Function Tests:  Recent Labs Lab 01/01/14 2055  AST 19  ALT 19  ALKPHOS 56  BILITOT 0.3  PROT 7.7  ALBUMIN 3.8   No results found for this basename: LIPASE, AMYLASE,   in the last 168 hours No results found for this basename: AMMONIA,  in the last 168 hours CBC:  Recent Labs Lab 01/01/14 2055 01/03/14 0320  WBC 6.4 4.3  NEUTROABS 4.1  --   HGB 13.0 11.8*  HCT 38.4* 35.1*  MCV 85.3 85.4  PLT 275 257   Cardiac Enzymes: No results found for this basename: CKTOTAL, CKMB, CKMBINDEX, TROPONINI,  in the last 168 hours BNP: BNP (last 3 results) No results found for this basename: PROBNP,  in the last 8760 hours CBG: No results found for this basename: GLUCAP,  in the last 168 hours     Signed:  Penny Pia  [facility] 01/03/2014, 12:42 PM

## 2014-01-06 LAB — FACTOR 5 LEIDEN

## 2014-01-11 ENCOUNTER — Inpatient Hospital Stay: Payer: Medicaid Other

## 2014-03-12 ENCOUNTER — Emergency Department (HOSPITAL_COMMUNITY)
Admission: EM | Admit: 2014-03-12 | Discharge: 2014-03-12 | Disposition: A | Discharge status: Home / Self Care | Payer: Medicaid Other | Attending: Emergency Medicine | Admitting: Emergency Medicine

## 2014-03-12 ENCOUNTER — Encounter (HOSPITAL_COMMUNITY): Payer: Self-pay | Admitting: Emergency Medicine

## 2014-03-12 DIAGNOSIS — F4322 Adjustment disorder with anxiety: Secondary | ICD-10-CM | POA: Insufficient documentation

## 2014-03-12 DIAGNOSIS — F17200 Nicotine dependence, unspecified, uncomplicated: Secondary | ICD-10-CM | POA: Insufficient documentation

## 2014-03-12 DIAGNOSIS — Z7901 Long term (current) use of anticoagulants: Secondary | ICD-10-CM | POA: Insufficient documentation

## 2014-03-12 DIAGNOSIS — F4329 Adjustment disorder with other symptoms: Secondary | ICD-10-CM | POA: Diagnosis present

## 2014-03-12 LAB — COMPREHENSIVE METABOLIC PANEL
ALBUMIN: 4.3 g/dL (ref 3.5–5.2)
ALK PHOS: 59 U/L (ref 39–117)
ALT: 24 U/L (ref 0–53)
AST: 16 U/L (ref 0–37)
BUN: 10 mg/dL (ref 6–23)
CHLORIDE: 97 meq/L (ref 96–112)
CO2: 27 mEq/L (ref 19–32)
Calcium: 9.7 mg/dL (ref 8.4–10.5)
Creatinine, Ser: 0.99 mg/dL (ref 0.50–1.35)
GFR calc Af Amer: >90 mL/min (ref >90)
GFR calc non Af Amer: >90 mL/min (ref >90)
Glucose, Bld: 87 mg/dL (ref 70–99)
POTASSIUM: 4 meq/L (ref 3.7–5.3)
Sodium: 137 mEq/L (ref 137–147)
Total Bilirubin: 0.4 mg/dL (ref 0.3–1.2)
Total Protein: 7.9 g/dL (ref 6.0–8.3)

## 2014-03-12 LAB — CBC
HEMATOCRIT: 44.7 % (ref 39.0–52.0)
Hemoglobin: 14.9 g/dL (ref 13.0–17.0)
MCH: 28.8 pg (ref 26.0–34.0)
MCHC: 33.3 g/dL (ref 30.0–36.0)
MCV: 86.3 fL (ref 78.0–100.0)
Platelets: 299 10*3/uL (ref 150–400)
RBC: 5.18 MIL/uL (ref 4.22–5.81)
RDW: 13.3 % (ref 11.5–15.5)
WBC: 3.6 10*3/uL — AB (ref 4.0–10.5)

## 2014-03-12 LAB — RAPID URINE DRUG SCREEN, HOSP PERFORMED
AMPHETAMINES: NOT DETECTED
BARBITURATES: NOT DETECTED
Benzodiazepines: NOT DETECTED
Cocaine: NOT DETECTED
Opiates: NOT DETECTED
TETRAHYDROCANNABINOL: POSITIVE — AB

## 2014-03-12 LAB — ETHANOL: Alcohol, Ethyl (B): <11 mg/dL (ref 0–11)

## 2014-03-12 MED ORDER — ZOLPIDEM TARTRATE 5 MG PO TABS: 5.0000 mg | ORAL_TABLET | Freq: Every evening | ORAL | Status: DC | PRN

## 2014-03-12 MED ORDER — ONDANSETRON HCL 4 MG PO TABS
4.0000 mg | ORAL_TABLET | Freq: Three times a day (TID) | ORAL | Status: DC | PRN
Start: 2014-03-12 — End: 2014-03-12

## 2014-03-12 MED ORDER — NICOTINE 21 MG/24HR TD PT24: 21.0000 mg | MEDICATED_PATCH | Freq: Every day | TRANSDERMAL | Status: DC

## 2014-03-12 MED ORDER — ACETAMINOPHEN 325 MG PO TABS: 650.0000 mg | ORAL_TABLET | ORAL | Status: DC | PRN

## 2014-03-12 MED ORDER — LORAZEPAM 1 MG PO TABS: 1.0000 mg | ORAL_TABLET | Freq: Three times a day (TID) | ORAL | Status: DC | PRN

## 2014-03-12 MED ORDER — RIVAROXABAN 15 MG PO TABS: 15.0000 mg | ORAL_TABLET | Freq: Once | ORAL | Status: DC

## 2014-03-12 MED ORDER — IBUPROFEN 200 MG PO TABS: 600.0000 mg | ORAL_TABLET | Freq: Three times a day (TID) | ORAL | Status: DC | PRN

## 2014-03-12 MED ORDER — RIVAROXABAN 20 MG PO TABS
20.0000 mg | ORAL_TABLET | Freq: Once | ORAL | Status: DC
  Filled 2014-03-12: qty 1

## 2014-03-12 NOTE — ED Provider Notes (Signed)
CSN: [id number]     Arrival date & time 03/12/14  1327 History  This chart was scribed for non-physician practitioner working with Cole Barrett, by Tana ConchStephen Methvin ED Scribe. This patient was seen in [facility] and the patient's care was started at 2:08 PM.    Chief Complaint  Patient presents with  . Medical Clearance      The history is provided by the patient. No language interpreter was used.    HPI Comments: Cole Barrett is a 28 y.o. male with h/o mental disorder who presents to the Emergency Department after being brought by [employer], he states that "he does not know why he is here". Pt is IVCed by his sister in law for "being danger to himself." He states that he sister in-law called the police on him and they brought him here. He does not live with his sister in-law and currently lives by himself. He states that he has been taking his anti-depressants and his medication for his "blood clots". States maybe missed a dose or two. Pt denies SI and HI. Pt denies using alcohol or drugs. Pt states he will do "whatever he has to do to get out of here".  He states that he takes his medications.   Past Medical History  Diagnosis Date  . Mental disorder    No past surgical history on file. No family history on file. History  Substance Use Topics  . Smoking status: Current Every Day Smoker -- 0.30 packs/day for .5 years    Types: Cigarettes  . Smokeless tobacco: Never Used  . Alcohol Use: Yes     Comment: 1 40 oz per day    Review of Systems  Psychiatric/Behavioral: Negative for suicidal ideas, hallucinations and confusion.  All other systems reviewed and are negative.     Allergies  Haldol  Home Medications   Prior to Admission medications   Medication Sig Start Date End Date Taking? Authorizing Provider  benztropine (COGENTIN) 1 MG tablet Take 1 mg by mouth at bedtime.    Historical Provider, MD  naproxen (NAPROSYN) 500 MG tablet Take 1 tablet (500 mg total)  by mouth 2 (two) times daily as needed for mild pain or moderate pain. 01/03/14   Penny Piarlando Vega, MD  risperiDONE microspheres (RISPERDAL CONSTA) 50 MG injection Inject 50 mg into the muscle every 14 (fourteen) days.    Historical Provider, MD  Rivaroxaban (XARELTO STARTER PACK) 15 & 20 MG TBPK Take as directed on package: Start with one 15mg  tablet by mouth twice a day with food. On Day 22, switch to one 20mg  tablet once a day with food. 01/03/14   Penny Piarlando Vega, MD  Rivaroxaban (XARELTO STARTER PACK) 15 & 20 MG TBPK Take as directed on package: Start with one 15mg  tablet by mouth twice a day with food. On Day 22, switch to one 20mg  tablet once a day with food. 01/03/14   Penny Piarlando Vega, MD  Rivaroxaban (XARELTO) 20 MG TABS tablet Take 1 tablet (20 mg total) by mouth daily with supper. 01/24/14   Penny Piarlando Vega, MD   BP 130/81  Pulse 84  Temp(Src) 98.4 F (36.9 C) (Oral)  Resp 16  SpO2 96% Physical Exam  Nursing note and vitals reviewed. Constitutional: He appears well-developed and well-nourished. No distress.  HENT:  Head: Normocephalic and atraumatic.  Eyes: Conjunctivae are normal.  Neck: Neck supple.  Cardiovascular: Normal rate, regular rhythm and normal heart sounds.   Pulmonary/Chest: Effort normal. No respiratory distress. He has no  wheezes. He has no rales.  Abdominal: Soft. Bowel sounds are normal. He exhibits no distension. There is no tenderness. There is no rebound.  Musculoskeletal: He exhibits no edema.  Neurological: He is alert.  Skin: Skin is warm and dry.  Psychiatric: His behavior is normal. Judgment and thought content normal.  Flat affect    ED Course  Procedures (including critical care time)  DIAGNOSTIC STUDIES: Oxygen Saturation is 96% on RA, normal by my interpretation.    COORDINATION OF CARE:   2:04 PM-Discussed treatment plan which includes ordering his medications with pt at bedside and pt agreed to plan.   Labs Review Labs Reviewed  CBC  COMPREHENSIVE  METABOLIC PANEL  ETHANOL  URINE RAPID DRUG SCREEN (HOSP PERFORMED)    Imaging Review No results found.   EKG Interpretation None      MDM   Final diagnoses:  Adjustment disorder with anxiety    Patient is here IVC by his sister-in-law for being danger to himself, disorganized, not taking his medications. Patient denies all of the above. He denies any SI or HI. Will get clearance labs, get TTS to assess patient.   Filed Vitals:   03/12/14 1330  BP: 130/81  Pulse: 84  Temp: 98.4 F (36.9 C)  TempSrc: Oral  Resp: 16  SpO2: 96%    I personally performed the services described in this documentation, which was scribed in my presence. The recorded information has been reviewed and is accurate.     Lottie Musselatyana A Dailyn Reith, PA-C 03/12/14 1901

## 2014-03-12 NOTE — Consult Note (Signed)
[facility] Face-to-Face Psychiatry Consult   [facility] Consult[facility]   Referring Physician:  EDP  Cole Barrett is an 28 y.o. male. Total Time spent with patient: 30 minutes  Assessment: AXIS I:  Adjustment Disorder with Anxiety AXIS II:  Deferred AXIS III:   Past Medical History  Diagnosis Date  . Mental disorder    AXIS IV:  other psychosocial or environmental problems AXIS V:  61-70 mild symptoms  Plan:  No evidence of imminent risk to self or others at present.   Patient does not meet criteria for psychiatric inpatient admission. Supportive therapy provided about ongoing stressors. Discussed crisis plan, support from social network, calling 911, coming to the Emergency Department, and calling Suicide Hotline.  Subjective:   Cole Barrett is a 28 y.o. male patient.  HPI:  Patient states. "I don't know why I'm here.  My sister in law told cops to come get me; I don't know why.  That's what the cops told me."  Patient denies suicidal/homicidal ideation, psychosis, and paranoia.  Patient lives alone; states that he is close to his father an brother.  Patient was able to give information about hospitalization regarding pulmonary embolism.  Patient states [facility].  Patient states that he is taking his medications.   HPI Elements:   Location:  Adjustment disorder. Quality:  Statement that he is not taking his medication. Severity:  Statement that he is not taking his medication.. Timing:  weeks.  Review of Systems  Constitutional: Negative for fever and chills.  Gastrointestinal: Negative for heartburn, nausea, vomiting and abdominal pain.  Musculoskeletal: Negative.   Neurological: Negative for headaches.  Psychiatric/Behavioral: Positive for substance abuse. Negative for depression, suicidal ideas and hallucinations. The patient is not nervous/anxious and does not have insomnia.     Denies family  history of substance abuse and mental illness Past Psychiatric History: Past Medical History  Diagnosis Date  . Mental disorder     reports that he has been smoking Cigarettes.  He has a .15 pack-year smoking history. He has never used smokeless tobacco. He reports that he drinks alcohol. He reports that he uses illicit drugs (Marijuana). No family history on file.         Allergies:   Allergies  Allergen Reactions  . Haldol [Haloperidol]     ACT Assessment Complete:  Yes:    Educational Status    Risk to Self: Risk to self Is patient at risk for suicide?: No Substance abuse history and/or treatment for substance abuse?: No  Risk to Others:    Abuse:    Prior Inpatient Therapy:    Prior Outpatient Therapy:    Additional Information:    Objective: Blood pressure 130/81, pulse 84, temperature 98.4 F (36.9 C), temperature source Oral, resp. rate 16, SpO2 96.00%.There is no weight on file to calculate BMI. Results for orders placed during the hospital encounter of 03/12/14 (from the past 72 hour(s))  URINE RAPID DRUG SCREEN (HOSP PERFORMED)     Status: Abnormal   Collection Time    03/12/14  1:53 PM      Result Value Ref Range   Opiates NONE DETECTED  NONE DETECTED   Cocaine NONE DETECTED  NONE DETECTED   Benzodiazepines NONE DETECTED  NONE DETECTED   Amphetamines NONE DETECTED  NONE DETECTED   Tetrahydrocannabinol POSITIVE (*) NONE DETECTED   Barbiturates NONE DETECTED  NONE DETECTED   Comment:  DRUG SCREEN FOR MEDICAL PURPOSES     ONLY.  IF CONFIRMATION IS NEEDED     FOR ANY PURPOSE, NOTIFY LAB     WITHIN 5 DAYS.                LOWEST DETECTABLE LIMITS     FOR URINE DRUG SCREEN     Drug Class       Cutoff (ng/mL)     Amphetamine      1000     Barbiturate      200     Benzodiazepine   200     Tricyclics       300     Opiates          300     Cocaine          300     THC              50  CBC     Status: Abnormal   Collection Time    03/12/14  2:05 PM       Result Value Ref Range   WBC 3.6 (*) 4.0 - 10.5 K/uL   RBC 5.18  4.22 - 5.81 MIL/uL   Hemoglobin 14.9  13.0 - 17.0 g/dL   HCT 84.644.7  96.239.0 - 95.252.0 %   MCV 86.3  78.0 - 100.0 fL   MCH 28.8  26.0 - 34.0 pg   MCHC 33.3  30.0 - 36.0 g/dL   RDW 84.113.3  32.411.5 - 40.115.5 %   Platelets 299  150 - 400 K/uL   Labs are reviewed and are pertinent for UDS positive THC.  No critical values noted.  Medications reviewed and no changes made   Current Facility-Administered Medications  Medication Dose Route Frequency Provider Last Rate Last Dose  . acetaminophen (TYLENOL) tablet 650 mg  650 mg Oral Q4H PRN Tatyana A Kirichenko, PA-C      . ibuprofen (ADVIL,MOTRIN) tablet 600 mg  600 mg Oral Q8H PRN Tatyana A Kirichenko, PA-C      . LORazepam (ATIVAN) tablet 1 mg  1 mg Oral Q8H PRN Tatyana A Kirichenko, PA-C      . nicotine (NICODERM CQ - dosed in mg/24 hours) patch 21 mg  21 mg Transdermal Daily Tatyana A Kirichenko, PA-C      . ondansetron (ZOFRAN) tablet 4 mg  4 mg Oral Q8H PRN Tatyana A Kirichenko, PA-C      . rivaroxaban (XARELTO) tablet 20 mg  20 mg Oral Once Tatyana A Kirichenko, PA-C      . zolpidem (AMBIEN) tablet 5 mg  5 mg Oral QHS PRN Tatyana A Kirichenko, PA-C       Current Outpatient Prescriptions  Medication Sig Dispense Refill  . benztropine (COGENTIN) 1 MG tablet Take 1 mg by mouth at bedtime.      . naproxen (NAPROSYN) 500 MG tablet Take 1 tablet (500 mg total) by mouth 2 (two) times daily as needed for mild pain or moderate pain.  20 tablet  0  . risperiDONE microspheres (RISPERDAL CONSTA) 50 MG injection Inject 50 mg into the muscle every 14 (fourteen) days.      . Rivaroxaban (XARELTO STARTER PACK) 15 & 20 MG TBPK Take as directed on package: Start with one 15mg  tablet by mouth twice a day with food. On Day 22, switch to one 20mg  tablet once a day with food.  51 each  0  . Rivaroxaban (XARELTO STARTER PACK) 15 & 20  MG TBPK Take as directed on package: Start with one 15mg  tablet by mouth  twice a day with food. On Day 22, switch to one 20mg  tablet once a day with food.  51 each  0  . Rivaroxaban (XARELTO) 20 MG TABS tablet Take 1 tablet (20 mg total) by mouth daily with supper.  30 tablet  0    Psychiatric Specialty Exam:     Blood pressure 130/81, pulse 84, temperature 98.4 F (36.9 C), temperature source Oral, resp. rate 16, SpO2 96.00%.There is no weight on file to calculate BMI.  General Appearance: Casual and odorous  Eye Contact::  Good  Speech:  Clear and Coherent and Normal Rate  Volume:  Normal  Mood:  Anxious  Affect:  Appropriate  Thought Process:  Circumstantial  Orientation:  Full (Time, Place, and Person)  Thought Content:  "I don't know why I'm here"  Suicidal Thoughts:  No  Homicidal Thoughts:  No  Memory:  Immediate;   Good Recent;   Good  Judgement:  Good  Insight:  Present  Psychomotor Activity:  Normal  Concentration:  Fair  Recall:  Good  Fund of Knowledge:Fair  Language: Fair  Akathisia:  No  Handed:  Right  AIMS (if indicated):     Assets:  Desire for Improvement Housing Social Support  Sleep:      Musculoskeletal: Strength & Muscle Tone: within normal limits Gait & Station: normal Patient leans: N/A  Patient is showing no signs of confusion.  Patient is able to answer questions.  Patient states that his taking his medication.  Patient denies SI/HI/AVH.  Treatment Plan Summary: Follow up with [facility]  Disposition:  Resend IVC; Discharge home.  Patient can continue to follow up with [facility].  Discharge Assessment     Demographic Factors:  Male  Total Time spent with patient: 15 minutes  Psychiatric Specialty Exam: Same as above  Musculoskeletal: Same as above  Mental Status Per Nursing Assessment::   On Admission:     Current Mental Status by Physician: Patient denies suicidal/homicidal ideation, psychosis, and paranoia  Loss Factors: NA  Historical Factors: NA  Risk Reduction Factors:   Positive social  support and Positive therapeutic relationship  Continued Clinical Symptoms:  None noted  Cognitive Features That Contribute To Risk:  None    Suicide Risk:  Minimal: No identifiable suicidal ideation.  Patients presenting with no risk factors but with morbid ruminations; may be classified as minimal risk based on the severity of the depressive symptoms  Discharge Diagnoses: Same as above  Plan Of Care/Follow-up recommendations:  Activity:  Resume usual activity Diet:  Resume usual diet  Is patient on multiple antipsychotic therapies at discharge:  No   Has Patient had three or more failed trials of antipsychotic monotherapy by history:  No  Recommended Plan for Multiple Antipsychotic Therapies: NA  Shuvon Rankin, FNP-BC 03/12/2014 2:42 PM

## 2014-03-12 NOTE — Discharge Instructions (Signed)
Adjustment Disorder  Most changes in life can cause stress. Getting used to changes may take a few months or longer. If feelings of stress, hopelessness, or worry continue, you may have an adjustment disorder. This stress-related mental health problem may affect your feelings, thinking and how you act. It occurs in both sexes and happens at any age.  SYMPTOMS   Some of the following problems may be seen and vary from person to person:  · Sadness or depression.  · Loss of enjoyment.  · Thoughts of suicide.  · Fighting.  · Avoiding family and friends.  · Poor school performance.  · Hopelessness, sense of loss.  · Trouble sleeping.  · Vandalism.  · Worry, weight loss or gain.  · Crying spells.  · Anxiety  · Reckless driving.  · Skipping school.  · Poor work performance.  · Nervousness.  · Ignoring bills.  · Poor attitude.  DIAGNOSIS   Your caregiver will ask what has happened in your life and do a physical exam. They will make a diagnosis of an adjustment disorder when they are sure another problem or medical illness causing your feelings does not exist.  TREATMENT   When problems caused by stress interfere with you daily life or last longer than a few months, you may need counseling for an adjustment disorder. Early treatment may diminish problems and help you to better cope with the stressful events in your life. Sometimes medication is necessary. Individual counseling and or support groups can be very helpful.  PROGNOSIS   Adjustment disorders usually last less than 3 to 6 months. The condition may persist if there is long lasting stress. This could include health problems, relationship problems, or job difficulties where you can not easily escape from what is causing the problem.  PREVENTION   Even the most mentally healthy, highly functioning people can suffer from an adjustment disorder given a significant blow from a life-changing event. There is no way to prevent pain and loss. Most people need help from time  to time. You are not alone.  SEEK MEDICAL CARE IF:   Your feelings or symptoms listed above do not improve or worsen.  Document Released: 07/19/2006 Document Revised: 02/06/2012 Document Reviewed: 10/10/2007  [employer]® Patient Information ©2014 [employer].

## 2014-03-12 NOTE — ED Notes (Signed)
Pt IVC'ed.  [facility] [facility] but they refused him because he has hx of PE (having no sx now).  IVC papers state that pt is schizophrenic and is not taking his medication.  "Respondent's thinking appears to be disorganized.  He does not speak in a rational manner.  It is literally impossible to carry on a normal ocnversation with the respondent.  The respondent will at times abuse alcohol and is a regular user of marijuana."    Pt denies all claims.  IVC papers were taken out by pt's sister.  Denies SI/HI.

## 2014-03-12 NOTE — ED Notes (Signed)
Patient and belongings have been wanded by security 

## 2014-03-12 NOTE — ED Notes (Signed)
Pt belongings bag was viewed by this Clinical research associatewriter. Pt was seen by psychiatrist and NP D/C'd pt. Belongings bag was not opened as pt was on unit for such a short time.

## 2014-03-12 NOTE — Consult Note (Signed)
Face to face evaluation and I agree with this note 

## 2014-03-13 ENCOUNTER — Encounter: Payer: Self-pay | Admitting: Internal Medicine

## 2014-03-13 ENCOUNTER — Ambulatory Visit: Payer: Medicaid Other | Attending: Internal Medicine | Admitting: Internal Medicine

## 2014-03-13 VITALS — BP 110/75 | HR 73 | Temp 98.1°F | Ht 69.0 in | Wt 164.9 lb

## 2014-03-13 DIAGNOSIS — F17200 Nicotine dependence, unspecified, uncomplicated: Secondary | ICD-10-CM | POA: Insufficient documentation

## 2014-03-13 DIAGNOSIS — Z7689 Persons encountering health services in other specified circumstances: Secondary | ICD-10-CM

## 2014-03-13 DIAGNOSIS — Z7901 Long term (current) use of anticoagulants: Secondary | ICD-10-CM | POA: Insufficient documentation

## 2014-03-13 DIAGNOSIS — IMO0001 Reserved for inherently not codable concepts without codable children: Secondary | ICD-10-CM

## 2014-03-13 DIAGNOSIS — I2782 Chronic pulmonary embolism: Secondary | ICD-10-CM | POA: Insufficient documentation

## 2014-03-13 DIAGNOSIS — Z7189 Other specified counseling: Secondary | ICD-10-CM

## 2014-03-13 DIAGNOSIS — Z9119 Patient's noncompliance with other medical treatment and regimen: Secondary | ICD-10-CM | POA: Insufficient documentation

## 2014-03-13 DIAGNOSIS — Z91199 Patient's noncompliance with other medical treatment and regimen due to unspecified reason: Secondary | ICD-10-CM | POA: Insufficient documentation

## 2014-03-13 DIAGNOSIS — I2699 Other pulmonary embolism without acute cor pulmonale: Secondary | ICD-10-CM

## 2014-03-13 LAB — LIPID PANEL
Cholesterol: 228 mg/dL — ABNORMAL HIGH (ref 0–200)
HDL: 51 mg/dL (ref >39)
LDL Cholesterol: 124 mg/dL — ABNORMAL HIGH (ref 0–99)
Total CHOL/HDL Ratio: 4.5 Ratio
Triglycerides: 264 mg/dL — ABNORMAL HIGH (ref <150)
VLDL: 53 mg/dL — ABNORMAL HIGH (ref 0–40)

## 2014-03-13 LAB — TSH: TSH: 1.516 u[IU]/mL (ref 0.350–4.500)

## 2014-03-13 MED ORDER — RIVAROXABAN 20 MG PO TABS: 20.0000 mg | ORAL_TABLET | Freq: Every day | ORAL | Status: DC

## 2014-03-13 NOTE — Progress Notes (Signed)
patient is here for a hospital follow up of a PE. He states that he was released 2 months ago, but has not properly followed up. He has no swelling or pain. Mother states that patient has not been taking his medication like prescribed, but he states otherwise.

## 2014-03-13 NOTE — Progress Notes (Signed)
Patient ID: Cole Barrett, male   DOB: 12/27/1985, 28 y.o.   MRN: [id number]    Cole Barrett, is a 28 y.o. male  [id number]  HQI:[id number]  DOB - 01/05/1986  CC:  Chief Complaint  Patient presents with  . Hospitalization Follow-up       HPI: Cole BrasilChristopher Bonaventura is a 28 y.o. male here today to establish medical care, he has a past medical history of schizophrenia.  Patient reports that he was in the hospital 2 months prior and was diagnosed with bilateral pulmonary embolus with infarct.  Per patient, he is complaint with Xarelto use but states he has missed an occasional day over the past two months.  Patient admits that he smokes 3 cigarettes per day.  He denies SOB, cough, chest pain, palpitations, numbess/tingling, or claudications. Patient also presents with a letter from [facility] that ask for permission to continue IM Risperdal injections while he is on Xarelto due to non-compliance with oral medications.  Patient states that he does not like getting the injections because they make him tired and sleepy for a few days after the injections.  His sister in law states that he has been "acting out" due to not taking his oral medications.  Allergies  Allergen Reactions  . Haldol [Haloperidol] Shortness Of Breath   Past Medical History  Diagnosis Date  . Mental disorder    Current Outpatient Prescriptions on File Prior to Visit  Medication Sig Dispense Refill  . benztropine (COGENTIN) 1 MG tablet Take 1 mg by mouth at bedtime.      . risperiDONE microspheres (RISPERDAL CONSTA) 50 MG injection Inject 50 mg into the muscle every 14 (fourteen) days.       No current facility-administered medications on file prior to visit.   No family history on file. History   Social History  . Marital Status: Single    Spouse Name: N/A    Number of Children: N/A  . Years of Education: N/A   Occupational History  . Not on file.   Social History Main Topics  . Smoking  status: Current Every Day Smoker -- 0.30 packs/day for .5 years    Types: Cigarettes  . Smokeless tobacco: Never Used  . Alcohol Use: Yes     Comment: 1 40 oz per day  . Drug Use: Yes    Special: Marijuana  . Sexual Activity: Not on file   Other Topics Concern  . Not on file   Social History Narrative  . No narrative on file    Review of Systems: Constitutional: Negative for fever, chills, diaphoresis, activity change, appetite change. Positive for fatigue after Risperdal injection.  HENT: Negative for ear pain, nosebleeds, congestion, facial swelling, rhinorrhea, neck pain, neck stiffness and ear discharge.  Eyes: Negative for pain, discharge, redness, itching and visual disturbance. Respiratory: Negative for cough, choking, chest tightness, shortness of breath, wheezing and stridor.  Cardiovascular: Negative for chest pain, palpitations and leg swelling. Gastrointestinal: Negative for abdominal distention. Genitourinary: Negative for dysuria, urgency, frequency, hematuria, flank pain, decreased urine volume, difficulty urinating.  Musculoskeletal: Negative for back pain, joint swelling, arthralgia and gait problem. Neurological: Negative for dizziness, tremors, seizures, syncope, facial asymmetry, speech difficulty, light-headedness, numbness and headaches.  Hematological: Negative for adenopathy. Does not bruise/bleed easily. Psychiatric/Behavioral: Negative for hallucinations, behavioral problems, confusion, dysphoric mood, decreased concentration and agitation.    Objective:   Filed Vitals:   03/13/14 1016  BP: 110/75  Pulse: 73  Temp: 98.1 F (36.7 C)  Physical Exam: Constitutional: Patient appears well-nourished. Patient appears disheveled and unclean. HENT: Normocephalic, atraumatic, External right and left ear normal. Oropharynx is clear and moist.  Eyes: Conjunctivae and EOM are normal. PERRLA, no scleral icterus. Neck: Normal ROM. Neck supple. No JVD. No  tracheal deviation. No thyromegaly. CVS: RRR, S1/S2 +, no murmurs, no gallops, no carotid bruit.  Pulmonary: Effort and breath sounds normal, no stridor, rhonchi, wheezes, rales.  Abdominal: Soft. BS +, no distension, tenderness, rebound or guarding.  Musculoskeletal: Normal range of motion. No edema and no tenderness.  Lymphadenopathy: No lymphadenopathy noted, cervical Neuro: Alert. Normal reflexes, muscle tone coordination. No cranial nerve deficit. Skin: Skin is warm and diaphoretic. No erythema. No pallor. Psychiatric: Normal mood and affect, but easily distracted.  Lab Results  Component Value Date   WBC 3.6* 03/12/2014   HGB 14.9 03/12/2014   HCT 44.7 03/12/2014   MCV 86.3 03/12/2014   PLT 299 03/12/2014   Lab Results  Component Value Date   CREATININE 0.99 03/12/2014   BUN 10 03/12/2014   NA 137 03/12/2014   K 4.0 03/12/2014   CL 97 03/12/2014   CO2 27 03/12/2014    No results found for this basename: HGBA1C   Lipid Panel  No results found for this basename: chol, trig, hdl, cholhdl, vldl, ldlcalc       Assessment and plan:   1. Encounter to establish care - Lipid Panel - TSH Patient sent home with letter that states it is acceptable for him to continue to receive IM injections while on Xarelto, confirmed with Dr. Hyman HopesJegede.   2. Pulmonary embolus  - rivaroxaban (XARELTO) 20 MG TABS tablet; Take 1 tablet (20 mg total) by mouth daily with supper.  Dispense: 30 tablet; Refill: 2 Will continue Xarelto for a total of 6 months.    3. Smoking Extensive counseling provided to patient about the dangers of smoking relating to blood clots and his overall health.  Patient verbalized understanding.  Gave patient information about smoking cessation.   Return in about 2 months (around 05/13/2014) for PE.  The patient was given clear instructions to go to ER or return to medical center if symptoms don't improve, worsen or new problems develop. The patient verbalized understanding.    Holland CommonsValerie Liddy Deam, NP-C [facility] [phone] 03/13/2014, 11:12 AM

## 2014-03-13 NOTE — Patient Instructions (Signed)
Smoking Cessation Quitting smoking is important to your health and has many advantages. However, it is not always easy to quit since nicotine is a very addictive drug. Often times, people try 3 times or more before being able to quit. This document explains the best ways for you to prepare to quit smoking. Quitting takes hard work and a lot of effort, but you can do it. ADVANTAGES OF QUITTING SMOKING  You will live longer, feel better, and live better.  Your body will feel the impact of quitting smoking almost immediately.  Within 20 minutes, blood pressure decreases. Your pulse returns to its normal level.  After 8 hours, carbon monoxide levels in the blood return to normal. Your oxygen level increases.  After 24 hours, the chance of having a heart attack starts to decrease. Your breath, hair, and body stop smelling like smoke.  After 48 hours, damaged nerve endings begin to recover. Your sense of taste and smell improve.  After 72 hours, the body is virtually free of nicotine. Your bronchial tubes relax and breathing becomes easier.  After 2 to 12 weeks, lungs can hold more air. Exercise becomes easier and circulation improves.  The risk of having a heart attack, stroke, cancer, or lung disease is greatly reduced.  After 1 year, the risk of coronary heart disease is cut in half.  After 5 years, the risk of stroke falls to the same as a nonsmoker.  After 10 years, the risk of lung cancer is cut in half and the risk of other cancers decreases significantly.  After 15 years, the risk of coronary heart disease drops, usually to the level of a nonsmoker.  If you are pregnant, quitting smoking will improve your chances of having a healthy baby.  The people you live with, especially any children, will be healthier.  You will have extra money to spend on things other than cigarettes. QUESTIONS TO THINK ABOUT BEFORE ATTEMPTING TO QUIT You may want to talk about your answers with your  caregiver.  Why do you want to quit?  If you tried to quit in the past, what helped and what did not?  What will be the most difficult situations for you after you quit? How will you plan to handle them?  Who can help you through the tough times? Your family? Friends? A caregiver?  What pleasures do you get from smoking? What ways can you still get pleasure if you quit? Here are some questions to ask your caregiver:  How can you help me to be successful at quitting?  What medicine do you think would be best for me and how should I take it?  What should I do if I need more help?  What is smoking withdrawal like? How can I get information on withdrawal? GET READY  Set a quit date.  Change your environment by getting rid of all cigarettes, ashtrays, matches, and lighters in your home, car, or work. Do not let people smoke in your home.  Review your past attempts to quit. Think about what worked and what did not. GET SUPPORT AND ENCOURAGEMENT You have a better chance of being successful if you have help. You can get support in many ways.  Tell your family, friends, and co-workers that you are going to quit and need their support. Ask them not to smoke around you.  Get individual, group, or telephone counseling and support. Programs are available at local hospitals and health centers. Call your local health department for   information about programs in your area.  Spiritual beliefs and practices may help some smokers quit.  Download a "quit meter" on your computer to keep track of quit statistics, such as how long you have gone without smoking, cigarettes not smoked, and money saved.  Get a self-help book about quitting smoking and staying off of tobacco. LEARN NEW SKILLS AND BEHAVIORS  Distract yourself from urges to smoke. Talk to someone, go for a walk, or occupy your time with a task.  Change your normal routine. Take a different route to work. Drink tea instead of coffee.  Eat breakfast in a different place.  Reduce your stress. Take a hot bath, exercise, or read a book.  Plan something enjoyable to do every day. Reward yourself for not smoking.  Explore interactive web-based programs that specialize in helping you quit. GET MEDICINE AND USE IT CORRECTLY Medicines can help you stop smoking and decrease the urge to smoke. Combining medicine with the above behavioral methods and support can greatly increase your chances of successfully quitting smoking.  Nicotine replacement therapy helps deliver nicotine to your body without the negative effects and risks of smoking. Nicotine replacement therapy includes nicotine gum, lozenges, inhalers, nasal sprays, and skin patches. Some may be available over-the-counter and others require a prescription.  Antidepressant medicine helps people abstain from smoking, but how this works is unknown. This medicine is available by prescription.  Nicotinic receptor partial agonist medicine simulates the effect of nicotine in your brain. This medicine is available by prescription. Ask your caregiver for advice about which medicines to use and how to use them based on your health history. Your caregiver will tell you what side effects to look out for if you choose to be on a medicine or therapy. Carefully read the information on the package. Do not use any other product containing nicotine while using a nicotine replacement product.  RELAPSE OR DIFFICULT SITUATIONS Most relapses occur within the first 3 months after quitting. Do not be discouraged if you start smoking again. Remember, most people try several times before finally quitting. You may have symptoms of withdrawal because your body is used to nicotine. You may crave cigarettes, be irritable, feel very hungry, cough often, get headaches, or have difficulty concentrating. The withdrawal symptoms are only temporary. They are strongest when you first quit, but they will go away within  10 14 days. To reduce the chances of relapse, try to:  Avoid drinking alcohol. Drinking lowers your chances of successfully quitting.  Reduce the amount of caffeine you consume. Once you quit smoking, the amount of caffeine in your body increases and can give you symptoms, such as a rapid heartbeat, sweating, and anxiety.  Avoid smokers because they can make you want to smoke.  Do not let weight gain distract you. Many smokers will gain weight when they quit, usually less than 10 pounds. Eat a healthy diet and stay active. You can always lose the weight gained after you quit.  Find ways to improve your mood other than smoking. FOR MORE INFORMATION  www.smokefree.gov  Document Released: 11/08/2001 Document Revised: 05/15/2012 Document Reviewed: 02/23/2012 [facility] Patient Information 2014 [place], [place]. Pulmonary Embolus A pulmonary (lung) embolus (PE) is a blood clot that has traveled from another place in the body to the lung. Most clots come from deep veins in the legs or pelvis. PE is a dangerous and potentially life-threatening condition that can be treated if identified. CAUSES Blood clots form in a vein for different reasons.  Usually several things cause blood clots. They include:  The flow of blood slows down.  The inside of the vein is damaged in some way.  The person has a condition that makes the blood clot more easily. These conditions may include:  Older age (especially over 30 years old).  Having a history of blood clots.  Having major or lengthy surgery. Hip surgery is particularly high-risk.  Breaking a hip or leg.  Sitting or lying still for a long time.  Cancer or cancer treatment.  Having a long, thin tube (catheter) placed inside a vein during a medical procedure.  Being overweight (obese).  Pregnancy and childbirth.  Medicines with estrogen.  Smoking.  Other circulation or heart problems. SYMPTOMS  The symptoms of a PE usually start suddenly  and include:  Shortness of breath.  Coughing.  Coughing up blood or blood-tinged mucus (phlegm).  Chest pain. Pain is often worse with deep breaths.  Rapid heartbeat. DIAGNOSIS  If a PE is suspected, your caregiver will take a medical history and carry out a physical exam. Your caregiver will check for the risk factors listed above. Tests that also may be required include:  Blood tests, including studies of the clotting properties of your blood.  Imaging tests. Ultrasound, CT, MRI, and other tests can all be used to see if you have clots in your legs or lungs. If you have a clot in your legs and have breathing or chest problems, your caregiver may conclude that you have a clot in your lungs. Further lung tests may not be needed.  Electrocardiography can look for heart strain from blood clots in the lungs. PREVENTION   Exercise the legs regularly. Take a brisk 30 minute walk every day.  Maintain a weight that is appropriate for your height.  Avoid sitting or lying in bed for long periods of time without moving your legs.  Women, particularly those over the age of 30, should consider the risks and benefits of taking estrogen medicines, including birth control pills.  Do not smoke, especially if you take estrogen medicines.  Long-distance travel can increase your risk. You should exercise your legs by walking or pumping the muscles every hour.  In hospital prevention:  Your caregiver will assess your need for preventive PE care (prophylaxis) when you are admitted to the hospital. If you are having surgery, your surgeon will assess you the day of or day after surgery.  Prevention may include medical and nonmedical measures. TREATMENT   The most common treatment for a PE is blood thinning (anticoagulant) medicine, which reduces the blood's tendency to clot. Anticoagulants can stop new blood clots from forming and old ones from growing. They cannot dissolve existing clots. Your body  does this by itself over time. Anticoagulants can be given by mouth, by intravenous (IV) access, or by injection. Your caregiver will determine the best program for you.  Less commonly, clot-dissolving drugs (thrombolytics) are used to dissolve a PE. They carry a high risk of bleeding, so they are used mainly in severe cases.  Very rarely, a blood clot in the leg needs to be removed surgically.  If you are unable to take anticoagulants, your caregiver may arrange for you to have a filter placed in a main vein in your abdomen. This filter prevents clots from traveling to your lungs. HOME CARE INSTRUCTIONS   Take all medicines prescribed by your caregiver. Follow the directions carefully.  Warfarin. Most people will continue taking warfarin after hospital discharge. Your  caregiver will advise you on the length of treatment (usually 3 6 months, sometimes lifelong).  Too much and too little warfarin are both dangerous. Too much warfarin increases the risk of bleeding. Too little warfarin continues to allow the risk for blood clots. While taking warfarin, you will need to have regular blood tests to measure your blood clotting time. These blood tests usually include both the prothrombin time (PT) and International Normalized Ratio (INR) tests. The PT and INR results allow your caregiver to adjust your dose of warfarin. The dose can change for many reasons. It is critically important that you take warfarin exactly as prescribed, and that you have your PT and INR levels drawn exactly as directed.  Many foods, especially foods high in vitamin K can interfere with warfarin and affect the PT and INR results. Foods high in vitamin K include spinach, kale, broccoli, cabbage, collard and turnip greens, brussels sprouts, peas, cauliflower, seaweed, and parsley as well as beef and pork liver, green tea, and soybean oil. You should eat a consistent amount of foods high in vitamin K. Avoid major changes in your diet,  or notify your caregiver before changing your diet. Arrange a visit with a dietitian to answer your questions.  Many medicines can interfere with warfarin and affect the PT and INR results. You must tell your caregiver about any and all medicines you take, this includes all vitamins and supplements. Be especially cautious with aspirin and anti-inflammatory medicines. Ask your caregiver before taking these. Do not take or discontinue any prescribed or over-the-counter medicine except on the advice of your caregiver or pharmacist.  Warfarin can have side effects, such as excessive bruising or bleeding. You will need to hold pressure over cuts for longer than usual.  Alcohol can change the body's ability to handle warfarin. It is best to avoid alcoholic drinks or consume only very small amounts while taking warfarin. Notify your caregiver if you change your alcohol intake.  Notify your dentist or other caregivers before procedures.  Avoid contact sports.  Wear a medical alert bracelet or carry a medical alert card.  Ask your caregiver how soon you can go back to normal activities. Not being active can lead to new clots. Ask for a list of what you should and should not do.  Compression stockings. These are tight elastic stockings that apply pressure to the lower legs. This can help keep the blood in the legs from clotting. You may need to wear compressions stockings at home to help prevent clots.  Smoking. If you smoke, quit. Ask your caregiver for help with quitting smoking.  Learn as much as you can about PE. Educating yourself can help prevent PE from reoccurring. SEEK MEDICAL CARE IF:   You notice a rapid heartbeat.  You feel weaker or more tired than usual.  You feel faint.  You notice increased bruising.  Your symptoms are not getting better in the time expected.  You are having side effects of medicine. SEEK IMMEDIATE MEDICAL CARE IF:   You have chest pain.  You have trouble  breathing.  You have new or increased swelling or pain in one leg.  You cough up blood.  You notice blood in vomit, in a bowel movement, or in urine.  You have an oral temperature above 102 F (38.9 C), not controlled by medicine. You may have another PE. A blood clot in the lungs is a medical emergency. Call your local emergency services (911 in [place] to get to  the nearest hospital or clinic. Do not drive yourself. MAKE SURE YOU:   Understand these instructions.  Will watch your condition.  Will get help right away if you are not doing well or get worse. Document Released: 11/11/2000 Document Revised: 05/15/2012 Document Reviewed: 05/18/2009 [facility] Patient Information 2014 [place], [place].

## 2014-03-13 NOTE — ED Provider Notes (Signed)
Medical screening examination/treatment/procedure(s) were performed by non-physician practitioner and as supervising physician I was immediately available for consultation/collaboration.   EKG Interpretation None       Kerith Sherley K Linker, MD 03/13/14 1604 

## 2014-03-14 ENCOUNTER — Other Ambulatory Visit: Payer: Self-pay | Admitting: Internal Medicine

## 2014-03-14 ENCOUNTER — Telehealth: Payer: Self-pay | Admitting: Emergency Medicine

## 2014-03-14 DIAGNOSIS — E785 Hyperlipidemia, unspecified: Secondary | ICD-10-CM

## 2014-03-14 MED ORDER — SIMVASTATIN 10 MG PO TABS: 10.0000 mg | ORAL_TABLET | Freq: Every day | ORAL | Status: DC

## 2014-03-14 NOTE — Telephone Encounter (Signed)
Pt care taker Marylu LundJanet given lab results with medication instructions

## 2014-03-14 NOTE — Telephone Encounter (Signed)
Message copied by Darlis LoanSMITH, JILL D on Fri Mar 14, 2014  5:49 PM ------      Message from: Holland CommonsKECK, VALERIE A      Created: Fri Mar 14, 2014  5:06 PM       Please call patient and inform him that his cholesterol and triglycerides are elevated and he will need to be started on a low dose statin.  Simvastatin 10 mg has been sent to our pharmacy. He will need to take this daily with evening meal ------

## 2015-07-14 ENCOUNTER — Emergency Department
Admission: EM | Admit: 2015-07-14 | Discharge: 2015-07-14 | Disposition: A | Discharge status: Home / Self Care | Payer: Self-pay | Attending: Emergency Medicine | Admitting: Emergency Medicine

## 2015-07-14 DIAGNOSIS — F10120 Alcohol abuse with intoxication, uncomplicated: Principal | ICD-10-CM | POA: Insufficient documentation

## 2015-07-14 DIAGNOSIS — F10920 Alcohol use, unspecified with intoxication, uncomplicated: Secondary | ICD-10-CM

## 2015-07-14 DIAGNOSIS — R4182 Altered mental status, unspecified: Secondary | ICD-10-CM | POA: Insufficient documentation

## 2015-07-14 DIAGNOSIS — R5383 Other fatigue: Secondary | ICD-10-CM | POA: Insufficient documentation

## 2015-07-14 DIAGNOSIS — S0101XA Laceration without foreign body of scalp, initial encounter: Secondary | ICD-10-CM | POA: Insufficient documentation

## 2015-07-14 DIAGNOSIS — X58XXXA Exposure to other specified factors, initial encounter: Secondary | ICD-10-CM | POA: Insufficient documentation

## 2015-07-14 DIAGNOSIS — F10929 Alcohol use, unspecified with intoxication, unspecified: Secondary | ICD-10-CM

## 2015-07-14 DIAGNOSIS — S0990XA Unspecified injury of head, initial encounter: Secondary | ICD-10-CM

## 2015-07-14 DIAGNOSIS — Y9289 Other specified places as the place of occurrence of the external cause: Secondary | ICD-10-CM | POA: Insufficient documentation

## 2015-07-14 DIAGNOSIS — R4781 Slurred speech: Secondary | ICD-10-CM | POA: Insufficient documentation

## 2015-07-14 LAB — GLUCOSE (POCT): Glucose (POCT): 96 mg/dL (ref 70–99)

## 2015-07-14 NOTE — ED Notes (Signed)
Patient returns from CT scan.

## 2015-07-14 NOTE — ED Provider Notes (Signed)
Emergency Department Note    CC :   Chief Complaint   Patient presents with    Alcohol Problem     medics called by bystanders after finding pt sitting on top of staircase, slurred speech, intoxicated       HPI :    29 year old  male with unknown PMH brought in by EMS after bystanders found patient sitting on top of staircase with slurred speech, appeared intoxicated. Unable to obtain further history due to intoxication.     Past Medical History : Unable to obtain due to altered mental status  Past Surgical history : Unable to obtain due to altered mental status    Meds :    Unable to obtain due to altered mental status    Allergies :    Review of patient's allergies indicates no known allergies.    Social History :     Unable to obtain due to altered mental status    Family history  :     Unable to obtain due to altered mental status    ROS:    Unable to obtain due to altered mental status    Physical exam  Vital signs reviewed and noted -    07/14/15  0209 07/14/15  0318 07/14/15  0553   BP: 135/87  (!) 160/109   Pulse: 99  72   Resp: 18  18   Temp:  97.7 F (36.5 C)    SpO2: 97%  96%      Gen: Lethargic, arousable to stimuli.   Head: No cephalohematomas. Multiple linear superficial lacerations.   Mouth: No erythema.   Eyes: PERRL.  Neck: No midline TTP or step offs.   Lungs: CTAB. No wrr.   CV: RRR. No mrg. 2+ distal pulses.   Abdomen: Non-tender. Non-distended.   Back: No midline TTP or step offs.   Extremities: No deformities, ecchymoses, tenderness.   Neurologic: No facial droop.    LABS    Labs Reviewed   GLUCOSE (POCT)       DIAGNOSTIC STUDIES    Ct Head W/o Contrast    Result Date: 07/14/2015  IMPRESSION: Normal CT scan of the head.    Clinical Decision Making     Patient is a 29 year old male with suspected alcohol intoxication  VS unremarkable  FSG 96  Does have multiple superficial linear lacerations to scalp, unclear when occurred  No other evidence of trauma  Plan CT head    ED course     CT head  negative  Patient reassessed with no neck pain, full ROM without pain  Metabolized, clinically sober  Ambulated well  Discharged    Impression    Alcohol abuse  Scalp lacerations    Seen and discussed with attending physician Witucki    Ilda Basset MD PGY4       Phebe Dettmer, Council Mechanic, MD  Resident  07/14/15 1639       Witucki, Etter Sjogren, MD  07/21/15 1710

## 2015-07-14 NOTE — ED Notes (Signed)
FSG: 96

## 2015-07-14 NOTE — ED Notes (Signed)
Patient discharged in satisfactory condition in care of self /c AVS  in hand. Patient voiced understanding of discharge instructions and ambulated from ED /c a steady gait and in possession of all personal items.

## 2015-07-14 NOTE — ED Notes (Signed)
PRN Note: Pt passed ambulation trial by walking over 20 feet to restroom and back with steady independent gait.

## 2015-07-14 NOTE — Discharge Instructions (Signed)
Alcohol Intoxication     You have been seen for alcohol intoxication.     Using alcohol as you did today suggests you might have a problem with alcohol abuse.     Alcohol abuse can cause many problems including liver disease, stomach ulcers and pancreatitis.     Drinking enough alcohol can cause you to stop breathing and die.     Fortunately, you did not suffer any life-threatening complications today.     DO NOT DRIVE A VEHICLE UNDER THE INFLUENCE OF ALCOHOL! YOU MIGHT INJURE OR KILL YOURSELF OR SOMEONE ELSE IF YOU DRINK AND DRIVE.     YOU SHOULD SEEK MEDICAL ATTENTION IMMEDIATELY, EITHER HERE OR AT THE NEAREST EMERGENCY DEPARTMENT, IF ANY OF THE FOLLOWING OCCURS:  · You drink enough to lose consciousness or black out.  · You become confused or lethargic or have trouble thinking, even if you haven’t been drinking.

## 2015-07-14 NOTE — ED Notes (Signed)
Patient sleeping, respirations even and non-labored. NAD noted at present. Sitter at bedside

## 2015-07-14 NOTE — ED Notes (Signed)
Bed: T06  Expected date:   Expected time:   Means of arrival:   Comments:  medic

## 2015-07-14 NOTE — ED Notes (Signed)
Assumed care of patient. Patient sleeping. Respirations even and non-labored. Sitter at bedside and NAD noted at present. Bed in low position and locked with side rails up x 2. Patient to CT scan

## 2015-07-14 NOTE — ED Notes (Signed)
PRN Note: Pt tolerated PO trial.

## 2016-05-06 ENCOUNTER — Emergency Department (HOSPITAL_COMMUNITY)
Admission: EM | Admit: 2016-05-06 | Discharge: 2016-05-06 | Disposition: A | Discharge status: Home / Self Care | Payer: Medicaid Other | Attending: Emergency Medicine | Admitting: Emergency Medicine

## 2016-05-06 ENCOUNTER — Encounter (HOSPITAL_COMMUNITY): Payer: Self-pay

## 2016-05-06 DIAGNOSIS — F17210 Nicotine dependence, cigarettes, uncomplicated: Secondary | ICD-10-CM | POA: Insufficient documentation

## 2016-05-06 DIAGNOSIS — R1033 Periumbilical pain: Secondary | ICD-10-CM

## 2016-05-06 DIAGNOSIS — Z8659 Personal history of other mental and behavioral disorders: Secondary | ICD-10-CM | POA: Insufficient documentation

## 2016-05-06 DIAGNOSIS — Z7901 Long term (current) use of anticoagulants: Secondary | ICD-10-CM | POA: Insufficient documentation

## 2016-05-06 LAB — CBC
HEMATOCRIT: 38.3 % — AB (ref 39.0–52.0)
Hemoglobin: 12.5 g/dL — ABNORMAL LOW (ref 13.0–17.0)
MCH: 26.7 pg (ref 26.0–34.0)
MCHC: 32.6 g/dL (ref 30.0–36.0)
MCV: 81.8 fL (ref 78.0–100.0)
Platelets: 254 10*3/uL (ref 150–400)
RBC: 4.68 MIL/uL (ref 4.22–5.81)
RDW: 12.9 % (ref 11.5–15.5)
WBC: 6.5 10*3/uL (ref 4.0–10.5)

## 2016-05-06 LAB — COMPREHENSIVE METABOLIC PANEL
ALBUMIN: 3.9 g/dL (ref 3.5–5.0)
ALK PHOS: 49 U/L (ref 38–126)
ALT: 35 U/L (ref 17–63)
AST: 27 U/L (ref 15–41)
Anion gap: 8 (ref 5–15)
BILIRUBIN TOTAL: 1.1 mg/dL (ref 0.3–1.2)
BUN: 11 mg/dL (ref 6–20)
CALCIUM: 8.8 mg/dL — AB (ref 8.9–10.3)
CO2: 26 mmol/L (ref 22–32)
Chloride: 105 mmol/L (ref 101–111)
Creatinine, Ser: 1.09 mg/dL (ref 0.61–1.24)
Glucose, Bld: 76 mg/dL (ref 65–99)
POTASSIUM: 3.4 mmol/L — AB (ref 3.5–5.1)
Sodium: 139 mmol/L (ref 135–145)
Total Protein: 6.6 g/dL (ref 6.5–8.1)

## 2016-05-06 LAB — URINALYSIS, ROUTINE W REFLEX MICROSCOPIC
BILIRUBIN URINE: NEGATIVE
Glucose, UA: NEGATIVE mg/dL
Hgb urine dipstick: NEGATIVE
Ketones, ur: 40 mg/dL — AB
LEUKOCYTES UA: NEGATIVE
NITRITE: NEGATIVE
PH: 6.5 (ref 5.0–8.0)
Protein, ur: NEGATIVE mg/dL
SPECIFIC GRAVITY, URINE: 1.021 (ref 1.005–1.030)

## 2016-05-06 LAB — LIPASE, BLOOD: Lipase: 30 U/L (ref 11–51)

## 2016-05-06 MED ORDER — OMEPRAZOLE 20 MG PO CPDR: 20.0000 mg | DELAYED_RELEASE_CAPSULE | Freq: Every day | ORAL | Status: DC

## 2016-05-06 MED ORDER — GI COCKTAIL ~~LOC~~
30.0000 mL | Freq: Once | ORAL | Status: AC
  Administered 2016-05-06: 30 mL via ORAL
  Filled 2016-05-06: qty 30

## 2016-05-06 NOTE — ED Provider Notes (Signed)
CSN: [id number]     Arrival date & time 05/06/16  0159 History   First MD Initiated Contact with Patient 05/06/16 [phone]     Chief Complaint  Patient presents with  . Abdominal Pain     (Consider location/radiation/quality/duration/timing/severity/associated sxs/prior Treatment) HPI Comments: Patient presents with complaint of central abdominal pain that started around 9:00 last night. He denies nausea or vomiting. No diarrhea. No fever. He states he ate some chicken wings while waiting to be seen and this helped his pain to a small degree. The pain does not radiate. No urinary symptoms, chest pain or SOB.   Patient is a [age] y.o. male presenting with abdominal pain. The history is provided by the patient. No language interpreter was used.  Abdominal Pain Pain location:  Periumbilical Pain quality: aching and cramping   Pain radiates to:  Does not radiate Pain severity:  Mild Onset quality:  Gradual Duration:  1 day Timing:  Constant Associated symptoms: no chest pain, no chills, no diarrhea, no dysuria, no fever, no nausea, no shortness of breath and no vomiting     Past Medical History  Diagnosis Date  . Mental disorder    History reviewed. No pertinent past surgical history. No family history on file. Social History  Substance Use Topics  . Smoking status: Current Every Day Smoker -- 0.30 packs/day for .5 years    Types: Cigarettes  . Smokeless tobacco: Never Used  . Alcohol Use: Yes     Comment: 1 40 oz per day    Review of Systems  Constitutional: Negative for fever and chills.  HENT: Negative.   Respiratory: Negative.  Negative for shortness of breath.   Cardiovascular: Negative.  Negative for chest pain.  Gastrointestinal: Positive for abdominal pain. Negative for nausea, vomiting and diarrhea.  Genitourinary: Negative.  Negative for dysuria.  Musculoskeletal: Negative.  Negative for myalgias.  Neurological: Negative.       Allergies  Haldol  Home Medications    Prior to Admission medications   Medication Sig Start Date End Date Taking? Authorizing Provider  benztropine (COGENTIN) 1 MG tablet Take 1 mg by mouth at bedtime.    Historical Provider, MD  risperiDONE microspheres (RISPERDAL CONSTA) 50 MG injection Inject 50 mg into the muscle every 14 (fourteen) days.    Historical Provider, MD  rivaroxaban (XARELTO) 20 MG TABS tablet Take 1 tablet (20 mg total) by mouth daily with supper. 03/13/14   Ambrose FinlandValerie A Keck, NP  simvastatin (ZOCOR) 10 MG tablet Take 1 tablet (10 mg total) by mouth at bedtime. 03/14/14   Ambrose FinlandValerie A Keck, NP   BP 123/75 mmHg  Pulse 86  Temp(Src) 97.9 F (36.6 C) (Oral)  Resp 20  Ht 5\' 10"  (1.778 m)  Wt 89.415 kg  BMI 28.28 kg/m2  SpO2 99% Physical Exam  Constitutional: He is oriented to person, place, and time. He appears well-developed and well-nourished.  HENT:  Head: Normocephalic.  Neck: Normal range of motion. Neck supple.  Cardiovascular: Normal rate and regular rhythm.   Pulmonary/Chest: Effort normal and breath sounds normal. He has no wheezes. He has no rales.  Abdominal: Soft. Bowel sounds are normal. He exhibits no distension. There is tenderness. There is no rebound and no guarding.  Mild periumbilical tenderness to soft abdomen, right greater than left.   Musculoskeletal: Normal range of motion. He exhibits no edema or tenderness.  Neurological: He is alert and oriented to person, place, and time.  Skin: Skin is warm and dry. No  rash noted.  Psychiatric: He has a normal mood and affect.    ED Course  Procedures (including critical care time) Labs Review Labs Reviewed  COMPREHENSIVE METABOLIC PANEL - Abnormal; Notable for the following:    Potassium 3.4 (*)    Calcium 8.8 (*)    All other components within normal limits  CBC - Abnormal; Notable for the following:    Hemoglobin 12.5 (*)    HCT 38.3 (*)    All other components within normal limits  URINALYSIS, ROUTINE W REFLEX MICROSCOPIC (NOT AT [facility])  - Abnormal; Notable for the following:    Ketones, ur 40 (*)    All other components within normal limits  LIPASE, BLOOD    Imaging Review No results found. I have personally reviewed and evaluated these images and lab results as part of my medical decision-making.   EKG Interpretation None      MDM   Final diagnoses:  None    1. Periumbilical abdominal pain  The patient appears comfortable and in NAD with presenting complaint of abdominal pain. He has eaten since arrival without worsening pain, nausea or vomiting. VSS, labs reassuring. He is given a GI cocktail and requests ginger ale to drink. He is felt appropriate for discharge home.     Elpidio Anis, PA-C 05/06/16 0448  Gwyneth Sprout, MD 05/06/16 [phone]

## 2016-05-06 NOTE — ED Notes (Addendum)
Pt sitting in waiting room area eating chicken wings dipped in a cheesy sauce and drinking Dr. Reino KentPepper.

## 2016-05-06 NOTE — ED Notes (Signed)
Pt reports generalized abdominal pain that started last night around 9pm. He denies nausea or vomiting and denies any other symptoms. When this RN asked him if he had any other complaints or symptoms he said no.

## 2016-05-06 NOTE — Discharge Instructions (Signed)

## 2016-05-22 ENCOUNTER — Emergency Department (HOSPITAL_COMMUNITY)
Admission: EM | Admit: 2016-05-22 | Discharge: 2016-05-22 | Disposition: A | Discharge status: Home / Self Care | Payer: Medicaid Other | Attending: Dermatology | Admitting: Dermatology

## 2016-05-22 ENCOUNTER — Encounter (HOSPITAL_COMMUNITY): Payer: Self-pay | Admitting: *Deleted

## 2016-05-22 DIAGNOSIS — R52 Pain, unspecified: Secondary | ICD-10-CM | POA: Insufficient documentation

## 2016-05-22 DIAGNOSIS — Z5321 Procedure and treatment not carried out due to patient leaving prior to being seen by health care provider: Secondary | ICD-10-CM | POA: Diagnosis not present

## 2016-05-22 NOTE — ED Notes (Signed)
Pt called no respone

## 2016-05-22 NOTE — ED Notes (Signed)
Pt called no respone 

## 2016-05-22 NOTE — ED Notes (Signed)
Called for room no response 

## 2016-05-22 NOTE — ED Notes (Signed)
Pt arrives to the ER via EMS for complaints of generalized pain; pt states that it began around 4am while he was sleeping; pt denies injury; pt unable to describe pain; pt denies worsening pain upon palpation of legs, arms or abdomen

## 2016-07-13 ENCOUNTER — Emergency Department (HOSPITAL_COMMUNITY): Payer: Medicaid Other

## 2016-07-13 ENCOUNTER — Inpatient Hospital Stay (HOSPITAL_COMMUNITY)
Admission: EM | Admit: 2016-07-13 | Discharge: 2016-07-14 | DRG: 176 | Disposition: A | Discharge status: Home / Self Care | Payer: Medicaid Other | Attending: Oncology | Admitting: Oncology

## 2016-07-13 ENCOUNTER — Encounter (HOSPITAL_COMMUNITY): Payer: Self-pay

## 2016-07-13 DIAGNOSIS — Z7901 Long term (current) use of anticoagulants: Secondary | ICD-10-CM

## 2016-07-13 DIAGNOSIS — F17210 Nicotine dependence, cigarettes, uncomplicated: Secondary | ICD-10-CM | POA: Diagnosis present

## 2016-07-13 DIAGNOSIS — I2699 Other pulmonary embolism without acute cor pulmonale: Principal | ICD-10-CM | POA: Diagnosis present

## 2016-07-13 DIAGNOSIS — Z86711 Personal history of pulmonary embolism: Secondary | ICD-10-CM | POA: Diagnosis not present

## 2016-07-13 LAB — COMPREHENSIVE METABOLIC PANEL
ALK PHOS: 45 U/L (ref 38–126)
ALT: 37 U/L (ref 17–63)
AST: 24 U/L (ref 15–41)
Albumin: 4.2 g/dL (ref 3.5–5.0)
Anion gap: 10 (ref 5–15)
BUN: 8 mg/dL (ref 6–20)
CHLORIDE: 100 mmol/L — AB (ref 101–111)
CO2: 24 mmol/L (ref 22–32)
CREATININE: 1.1 mg/dL (ref 0.61–1.24)
Calcium: 9.3 mg/dL (ref 8.9–10.3)
GFR calc Af Amer: >60 mL/min (ref >60)
Glucose, Bld: 111 mg/dL — ABNORMAL HIGH (ref 65–99)
Potassium: 3.9 mmol/L (ref 3.5–5.1)
Sodium: 134 mmol/L — ABNORMAL LOW (ref 135–145)
Total Bilirubin: 0.9 mg/dL (ref 0.3–1.2)
Total Protein: 7.6 g/dL (ref 6.5–8.1)

## 2016-07-13 LAB — URINALYSIS, ROUTINE W REFLEX MICROSCOPIC
BILIRUBIN URINE: NEGATIVE
Glucose, UA: NEGATIVE mg/dL
HGB URINE DIPSTICK: NEGATIVE
Ketones, ur: NEGATIVE mg/dL
Leukocytes, UA: NEGATIVE
Nitrite: NEGATIVE
PH: 6 (ref 5.0–8.0)
Protein, ur: NEGATIVE mg/dL
Specific Gravity, Urine: 1.039 — ABNORMAL HIGH (ref 1.005–1.030)

## 2016-07-13 LAB — CBC WITH DIFFERENTIAL/PLATELET
BASOS ABS: 0 10*3/uL (ref 0.0–0.1)
Basophils Relative: 0 %
Eosinophils Absolute: 0.1 10*3/uL (ref 0.0–0.7)
Eosinophils Relative: 1 %
HCT: 40.8 % (ref 39.0–52.0)
HEMOGLOBIN: 13.4 g/dL (ref 13.0–17.0)
Lymphocytes Relative: 10 %
Lymphs Abs: 1.1 10*3/uL (ref 0.7–4.0)
MCH: 28.3 pg (ref 26.0–34.0)
MCHC: 32.8 g/dL (ref 30.0–36.0)
MCV: 86.1 fL (ref 78.0–100.0)
Monocytes Absolute: 1.4 10*3/uL — ABNORMAL HIGH (ref 0.1–1.0)
Monocytes Relative: 14 %
NEUTROS ABS: 7.8 10*3/uL — AB (ref 1.7–7.7)
NEUTROS PCT: 75 %
PLATELETS: 251 10*3/uL (ref 150–400)
RBC: 4.74 MIL/uL (ref 4.22–5.81)
RDW: 13.4 % (ref 11.5–15.5)
WBC: 10.4 10*3/uL (ref 4.0–10.5)

## 2016-07-13 LAB — D-DIMER, QUANTITATIVE (NOT AT ARMC): D DIMER QUANT: 0.84 ug{FEU}/mL — AB (ref 0.00–0.50)

## 2016-07-13 MED ORDER — ENOXAPARIN SODIUM 100 MG/ML ~~LOC~~ SOLN
90.0000 mg | Freq: Two times a day (BID) | SUBCUTANEOUS | Status: DC
  Administered 2016-07-13 – 2016-07-14 (×2): 90 mg via SUBCUTANEOUS
  Filled 2016-07-13 (×2): qty 1

## 2016-07-13 MED ORDER — HEPARIN (PORCINE) IN NACL 100-0.45 UNIT/ML-% IJ SOLN
1400.0000 [IU]/h | INTRAMUSCULAR | Status: DC
  Administered 2016-07-13: 1400 [IU]/h via INTRAVENOUS
  Filled 2016-07-13 (×2): qty 250

## 2016-07-13 MED ORDER — MORPHINE SULFATE (PF) 4 MG/ML IV SOLN
4.0000 mg | Freq: Once | INTRAVENOUS | Status: AC
  Administered 2016-07-13: 4 mg via INTRAVENOUS
  Filled 2016-07-13: qty 1

## 2016-07-13 MED ORDER — HYDROMORPHONE HCL 1 MG/ML IJ SOLN
1.0000 mg | Freq: Once | INTRAMUSCULAR | Status: AC
  Administered 2016-07-13: 1 mg via INTRAVENOUS
  Filled 2016-07-13: qty 1

## 2016-07-13 MED ORDER — HEPARIN BOLUS VIA INFUSION
5000.0000 [IU] | Freq: Once | INTRAVENOUS | Status: AC
  Administered 2016-07-13: 5000 [IU] via INTRAVENOUS
  Filled 2016-07-13: qty 5000

## 2016-07-13 MED ORDER — SODIUM CHLORIDE 0.9 % IV BOLUS (SEPSIS)
1000.0000 mL | Freq: Once | INTRAVENOUS | Status: AC
  Administered 2016-07-13: 1000 mL via INTRAVENOUS

## 2016-07-13 MED ORDER — IOPAMIDOL (ISOVUE-370) INJECTION 76%
INTRAVENOUS | Status: AC
  Administered 2016-07-13: 70 mL via INTRAVENOUS
  Filled 2016-07-13: qty 100

## 2016-07-13 MED ORDER — MORPHINE SULFATE (PF) 2 MG/ML IV SOLN: 2.0000 mg | Freq: Once | INTRAVENOUS | Status: DC

## 2016-07-13 MED ORDER — ONDANSETRON HCL 4 MG/2ML IJ SOLN
4.0000 mg | Freq: Once | INTRAMUSCULAR | Status: AC
  Administered 2016-07-13: 4 mg via INTRAVENOUS
  Filled 2016-07-13: qty 2

## 2016-07-13 MED ORDER — KETOROLAC TROMETHAMINE 30 MG/ML IJ SOLN
30.0000 mg | Freq: Four times a day (QID) | INTRAMUSCULAR | Status: DC | PRN
  Administered 2016-07-13 – 2016-07-14 (×3): 30 mg via INTRAVENOUS
  Filled 2016-07-13 (×3): qty 1

## 2016-07-13 NOTE — ED Notes (Signed)
Ambulating pt to shower room.

## 2016-07-13 NOTE — ED Notes (Signed)
Walked with patient to restroom, pt tolerated walking well.

## 2016-07-13 NOTE — ED Notes (Addendum)
Pt ambulated to restroom from room. Pt tolerated well. Unable to obtain sample.

## 2016-07-13 NOTE — ED Provider Notes (Signed)
[facility]-EMERGENCY DEPT Provider Note   CSN: [id number] Arrival date & time: 07/13/16  0620     History   Chief Complaint Chief Complaint  Patient presents with  . Flank Pain    HPI Cole Barrett is a 30 y.o. male.  Pain left superior lateral abdomen since last night. Patient states this feels similar to the pain 2 years ago which resulted in a pulmonary embolism. Pain is described as sharp and constant. No substernal chest pain, dyspnea, fever, sweats, chills, dysuria, hematuria. No prolonged immobilization, recent travel, known diagnosis of cancer or clotting disorder      Past Medical History:  Diagnosis Date  . Mental disorder     Patient Active Problem List   Diagnosis Date Noted  . Pulmonary emboli (HCC) 07/13/2016  . Adjustment disorder with anxiety 03/12/2014  . 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 Acute left flank pain 01/02/2014  . Pulmonary embolism, bilateral (HCC) 01/02/2014    History reviewed. No pertinent surgical history.     Home Medications    Prior to Admission medications   Medication Sig Start Date End Date Taking? Authorizing Provider  benztropine (COGENTIN) 1 MG tablet Take 1 mg by mouth at bedtime.    Historical Provider, MD  omeprazole (PRILOSEC) 20 MG capsule Take 1 capsule (20 mg total) by mouth daily. 05/06/16   Elpidio Anis, PA-C  risperiDONE microspheres (RISPERDAL CONSTA) 50 MG injection Inject 50 mg into the muscle every 14 (fourteen) days.    Historical Provider, MD  rivaroxaban (XARELTO) 20 MG TABS tablet Take 1 tablet (20 mg total) by mouth daily with supper. 03/13/14   Ambrose Finland, NP  simvastatin (ZOCOR) 10 MG tablet Take 1 tablet (10 mg total) by mouth at bedtime. 03/14/14   Ambrose Finland, NP    Family History No family history on file.  Social History Social History  Substance Use Topics  . Smoking status: Current Every Day Smoker    Packs/day: 0.30    Years: 0.50    Types: Cigarettes  .  Smokeless tobacco: Never Used  . Alcohol use Yes     Comment: 1 40 oz per day     Allergies   Haldol [haloperidol]   Review of Systems Review of Systems  All other systems reviewed and are negative.    Physical Exam Updated Vital Signs BP 138/92 (BP Location: Left Arm)   Pulse 98   Temp 99.1 F (37.3 C) (Oral)   Resp 22   Ht 5\' 10"  (1.778 m)   Wt 195 lb (88.5 kg)   SpO2 96%   BMI 27.98 kg/m   Physical Exam  Constitutional: He appears well-developed and well-nourished.  No acute distress  HENT:  Head: Normocephalic and atraumatic.  Eyes: Conjunctivae are normal.  Neck: Neck supple.  Cardiovascular: Normal rate and regular rhythm.   No murmur heard. Pulmonary/Chest: Effort normal and breath sounds normal. No respiratory distress.  Abdominal: Soft. There is no tenderness.  Minimal tenderness anterior superior lateral abdomen  Musculoskeletal: He exhibits no edema.  Neurological: He is alert.  Skin: Skin is warm and dry.  Psychiatric: He has a normal mood and affect.  Nursing note and vitals reviewed.    ED Treatments / Results  Labs (all labs ordered are listed, but only abnormal results are displayed) Labs Reviewed  CBC WITH DIFFERENTIAL/PLATELET - Abnormal; Notable for the following:       Result Value   Neutro Abs 7.8 (*)  Monocytes Absolute 1.4 (*)    All other components within normal limits  COMPREHENSIVE METABOLIC PANEL - Abnormal; Notable for the following:    Sodium 134 (*)    Chloride 100 (*)    Glucose, Bld 111 (*)    All other components within normal limits  D-DIMER, QUANTITATIVE (NOT AT [facility]) - Abnormal; Notable for the following:    D-Dimer, Quant 0.84 (*)    All other components within normal limits  URINALYSIS, ROUTINE W REFLEX MICROSCOPIC (NOT AT [facility])    EKG  EKG Interpretation None       Radiology Dg Chest 2 View  Result Date: 07/13/2016 CLINICAL DATA:  Left superior and lateral abdominal pain. History of pulmonary  embolism. EXAM: CHEST  2 VIEW COMPARISON:  Chest CT 01/02/2014 FINDINGS: Peripheral opacity at the left base. Lung volumes are low. Borderline cardiomegaly, accentuated by technique. No acute osseous finding. No edema, effusion, or pneumothorax. IMPRESSION: Peripheral opacity at the symptomatic left base. Findings primarily concerning for lung infarct in this clinical setting. Chest CTA has already been ordered. Electronically Signed   By: Marnee SpringJonathon  Watts M.D.   On: 07/13/2016 10:52   Ct Angio Chest Pe W And/or Wo Contrast  Result Date: 07/13/2016 CLINICAL DATA:  LEFT lower lateral chest and upper abdominal pain that awoke patient from sleep this morning, history DVT, history pulmonary embolism, similar type pain as prior pulmonary embolus EXAM: CT ANGIOGRAPHY CHEST WITH CONTRAST TECHNIQUE: Multidetector CT imaging of the chest was performed using the standard protocol during bolus administration of intravenous contrast. Multiplanar CT image reconstructions and MIPs were obtained to evaluate the vascular anatomy. CONTRAST:  70 cc Isovue 370 IV COMPARISON:  01/02/2014 FINDINGS: Cardiovascular: Aorta normal caliber without aneurysm or dissection. No pericardial effusion. Pulmonary arteries adequately opacified. Filling defects identified in LEFT upper lobe and RIGHT lower lobe pulmonary arteries consistent with pulmonary emboli. Elevated RV:LV ratio = 1.14 Mediastinum/Nodes: No thoracic adenopathy. Small hiatal hernia. Base of cervical region unremarkable. Lungs/Pleura: Infiltrate at base of lingula. Minimal dependent atelectasis in both lungs with tiny amount of LEFT pleural fluid. Remaining lungs clear. No pneumothorax. Upper Abdomen: Visualized upper abdomen unremarkable. Musculoskeletal: No acute osseous findings. Review of the MIP images confirms the above findings. IMPRESSION: Small pulmonary emboli identified in LEFT upper lobe and RIGHT lower lobe. Positive for acute PE with CT evidence of right heart  strain (RV/LV Ratio = 1.14) consistent with at least submassive (intermediate risk) PE. The presence of right heart strain has been associated with an increased risk of morbidity and mortality. Please activate Code PE by paging [phone]. Small hiatal hernia. Critical Value/emergent results were called by telephone at the time of interpretation on 07/13/2016 at 11:43 am to Dr. Donnetta HutchingBRIAN Lashunda Greis , who verbally acknowledged these results. Electronically Signed   By: Ulyses SouthwardMark  Boles M.D.   On: 07/13/2016 11:43    Procedures Procedures (including critical care time)  Medications Ordered in ED Medications  heparin bolus via infusion 5,000 Units (not administered)  heparin ADULT infusion 100 units/mL (25000 units/21950mL sodium chloride 0.45%) (not administered)  sodium chloride 0.9 % bolus 1,000 mL (0 mLs Intravenous Stopped 07/13/16 1012)  ondansetron (ZOFRAN) injection 4 mg (4 mg Intravenous Given 07/13/16 0858)  morphine 4 MG/ML injection 4 mg (4 mg Intravenous Given 07/13/16 0858)  HYDROmorphone (DILAUDID) injection 1 mg (1 mg Intravenous Given 07/13/16 1012)  iopamidol (ISOVUE-370) 76 % injection (70 mLs Intravenous Contrast Given 07/13/16 1114)     Initial Impression / Assessment and Plan /  ED Course  I have reviewed the triage vital signs and the nursing notes.  Pertinent labs & imaging results that were available during my care of the patient were reviewed by me and considered in my medical decision making (see chart for details).   CRITICAL CARE Performed by: Donnetta HutchingOOK,Ronit Marczak  ?  Total critical care time: 30 minutes  Critical care time was exclusive of separately billable procedures and treating other patients.  Critical care was necessary to treat or prevent imminent or life-threatening deterioration.  Critical care was time spent personally by me on the following activities: development of treatment plan with patient and/or surrogate as well as nursing, discussions with consultants, evaluation of  patient's response to treatment, examination of patient, obtaining history from patient or surrogate, ordering and performing treatments and interventions, ordering and review of laboratory studies, ordering and review of radiographic studies, pulse oximetry and re-evaluation of patient's condition. Clinical Course    Patient is hemodynamically stable. CT angiogram shows a pulmonary embolism in the left upper lobe and right lower lobe. Will start heparin drip.  Discussed with critical care. Admit to general medicine.  Final Clinical Impressions(s) / ED Diagnoses   Final diagnoses:  Other acute pulmonary embolism without acute cor pulmonale (HCC)    New Prescriptions New Prescriptions   No medications on file     Donnetta HutchingBrian Abigal Choung, MD 07/13/16 1233

## 2016-07-13 NOTE — H&P (Signed)
Date: 07/13/2016               Patient Name:  Cole ChessChristopher A Speedy MRN: [id number]  DOB: 08/21/1986 Age / Sex: 30 y.o., male   PCP: Ambrose FinlandValerie A Keck, NP         Medical Service: Internal Medicine Teaching Service         Attending Physician: Dr. Levert FeinsteinJames M Granfortuna, MD    First Contact: Juanna Caoaylor Fie, MS4 Pager: [phone]  Second Contact: Dr. Heywood Ilesushil Shannen [place] Pager: [phone]       After Hours (After 5p/  First Contact Pager: [phone]  weekends / holidays): Second Contact Pager: [phone]   Chief Complaint: left sided pain  History of Present Illness: [name] is a 30 year old male who presents to the ED with left-sided pain. Around 2:00 this morning, he woke up with sharp left-sided pain localized to the rib and abdomen. Pain is worse with breathing and coughing, and he cannot identify any alleviating factors. He does not report any associated dyspnea, chest pain, leg swelling, hemoptysis, nausea, vomiting, changes in appetite. He feels the symptoms are similar to 2014 when he says he was hospitalized and found to have bilateral submassive PE [February 2015 per our records]. He acknowledges taking rivaroxaban was discontinued off therapy. He denies any recent trauma or surgery. He does not report a family history.of clotting disorders. His only recent travel was by bus which he says lasted for hours at a time. [place]. He smokes a pack per day for the past couple years and   In the ED, he was found to have elevated d-dimer 0.84. CTA was notable for pulmonary emboli in left upper lobe and right lower lobe. He was started on heparin IV and received hydromorphone 1mg  and morphine 4mg  for pain.   Meds:  No outpatient prescriptions have been marked as taking for the 07/13/16 encounter [facility](Hospital Encounter).     Allergies: Allergies as of 07/13/2016 - Review Complete 07/13/2016  Allergen Reaction Noted  . Haldol [haloperidol] Shortness Of Breath 01/02/2014   Past  Medical History:  Diagnosis Date  . Mental disorder     Family History: Negative for clotting disorders as noted above.  Social History: As noted in the history of present illness  Review of Systems: A complete ROS was negative except as per HPI.   Physical Exam: Blood pressure (!) 144/93, pulse (!) 115, temperature 99.5 F (37.5 C), temperature source Oral, resp. rate 20, height 5\' 10"  (1.778 m), weight 195 lb (88.5 kg), SpO2 98 %. Physical Exam  Constitutional: No distress.  HENT:  Head: Normocephalic and atraumatic.  Eyes: Conjunctivae are normal. No scleral icterus.  Cardiovascular: Regular rhythm.   Tachycardic  Pulmonary/Chest: Effort normal. No respiratory distress.  Skin: He is not diaphoretic.   CXR: Questionable infarction of the left lower lung.  Assessment & Plan by Problem: Active Problems:   Pulmonary emboli [facility](HCC)  [name] is a 30 year old male hospitalized for unprovoked bilateral submassive pulmonary emboli.  Unprovoked bilateral submassive pulmonary emboli: Hemodynamically stable which is reassuring. Unclear trigger which may warrant longer course of anticoagulation. -Transition heparin IV to Lovenox with input from pharmacy -Consider long-term anticoagulation in the absence of overt precipitant  -Give ketorolac 30 mg IV every 6 hours as needed for pain -Recheck CBC tomorrow morning  #FEN:  -Diet: Regular  #DVT prophylaxis: Lovenox as noted above  #CODE STATUS: FULL CODE  Dispo: Admit patient  to Inpatient with expected length of stay greater than 2 midnights.  Signed: Beather Arbourushil V Huriel Matt, MD 07/13/2016, 4:02 PM  Pager: [phone]

## 2016-07-13 NOTE — H&P (Signed)
Date: 07/13/2016               Patient Name:  Cole Barrett MRN: [id number]  DOB: 12-03-1985 Age / Sex: 30 y.o., male   PCP: Lance Bosch, NP              Medical Service: Internal Medicine Teaching Service              Attending Physician: Dr. Annia Belt, MD    First Contact: Pershing Cox, MS4 Pager: [phone]  Second Contact: Dr. Charlott Rakes Pager: [phone]            After Hours (After 5p/  First Contact Pager: [phone]  weekends / holidays): Second Contact Pager: [phone]   Chief Complaint: left-sided pain  History of Present Illness: Cole Barrett is a 30 yo otherwise healthy male with a history of pulmonary embolus in 2014 who presents with recurrent, unprovoked bilateral pulmonary emboli. Cole Barrett states that around 2 am this morning, he was woken from sleep by a sharp pain in his left side, extending from his axilla to below his ribcage. The pain does not radiate and is worsened by deep inspiration and coughing. The pain is unchanged and constant, and there is nothing that seems to make it better. He denies shortness of breath, chest pain, calf pain and swelling, hemoptysis, nausea, and vomiting. He endorses a couple hours of recent travel by bus but denies further immobility. He has smoked 1 pack per day of cigarettes for "a couple years" but denies recent trauma or surgery. According to Cole Barrett, he was never given a reason as to why he developed a PE in 2014, and he denies any risk factors prior to that hospitalization. He was treated with xarelto for "a couple months" afterwards and endorses compliance. He denies any known personal or family history of bleeding or clotting disorders. He has not seen a PCP in several years.  Meds: Patient denies any outpatient medications  Allergies: Allergies as of 07/13/2016 - Review Complete 07/13/2016  Allergen Reaction Noted  . Haldol [haloperidol] Shortness Of Breath 01/02/2014   Past Medical History: Pulmonary  embolus in 2014  Family History: No family history of clotting or bleeding disorders.  Social History: Cole Barrett lives in [place]. He has smoked 1 ppd cigarettes for "a couple years." Denies alcohol and illicit drug use.  Review of Systems: Pertinent items noted in HPI and remainder of comprehensive ROS otherwise negative.  Physical Exam: Blood pressure 134/83, pulse 104, temperature 99.1 F (37.3 C), temperature source Oral, resp. rate 20, height '5\' 10"'$  (1.778 m), weight 88.5 kg (195 lb), SpO2 95 %. General appearance: well-developed African American male in mild distress from pain. Flat affect but cooperative. HEENT: normocephalic, atraumatic. EOMI. Hearing and vision grossly normal. Lungs: CTA with decreased breath sounds bilaterally. No wheezes, rales, or crackles. Heart: Tachycardic. Regular rhythm. No murmurs, rubs, or gallops. Abdomen: BS+ Extremities: Skin warm and dry. Pulses 2+ TP. Trace pitting edema LLE to mid-tibia. LLE's symmetric and non-tender.  Lab results: Component     Latest Ref Rng & Units 07/13/2016  WBC     4.0 - 10.5 K/uL 10.4  RBC     4.22 - 5.81 MIL/uL 4.74  Hemoglobin     13.0 - 17.0 g/dL 13.4  HCT     39.0 - 52.0 % 40.8  MCV     78.0 - 100.0 fL 86.1  MCH     26.0 - 34.0 pg 28.3  MCHC     30.0 - 36.0 g/dL 32.8  RDW     11.5 - 15.5 % 13.4  Platelets     150 - 400 K/uL 251  Neutrophils     % 75  NEUT#     1.7 - 7.7 K/uL 7.8 (H)  Lymphocytes     % 10  Lymphocyte #     0.7 - 4.0 K/uL 1.1  Monocytes Relative     % 14  Monocyte #     0.1 - 1.0 K/uL 1.4 (H)  Eosinophil     % 1  Eosinophils Absolute     0.0 - 0.7 K/uL 0.1  Basophil     % 0  Basophils Absolute     0.0 - 0.1 K/uL 0.0  Sodium     135 - 145 mmol/L 134 (L)  Potassium     3.5 - 5.1 mmol/L 3.9  Chloride     101 - 111 mmol/L 100 (L)  CO2     22 - 32 mmol/L 24  Glucose     65 - 99 mg/dL 111 (H)  BUN     6 - 20 mg/dL 8  Creatinine     0.61 - 1.24 mg/dL 1.10    Calcium     8.9 - 10.3 mg/dL 9.3  Total Protein     6.5 - 8.1 g/dL 7.6  Albumin     3.5 - 5.0 g/dL 4.2  AST     15 - 41 U/L 24  ALT     17 - 63 U/L 37  Alkaline Phosphatase     38 - 126 U/L 45  Total Bilirubin     0.3 - 1.2 mg/dL 0.9  EGFR (Non-African Amer.)     >60 mL/min >60  EGFR (African American)     >60 mL/min >60  Anion gap     5 - 15 10  D-Dimer, Quant     0.00 - 0.50 ug/mL-FEU 0.84 (H)    Imaging results:  Dg Chest 2 View  Result Date: 07/13/2016 CLINICAL DATA:  Left superior and lateral abdominal pain. History of pulmonary embolism. EXAM: CHEST  2 VIEW COMPARISON:  Chest CT 01/02/2014 FINDINGS: Peripheral opacity at the left base. Lung volumes are low. Borderline cardiomegaly, accentuated by technique. No acute osseous finding. No edema, effusion, or pneumothorax. IMPRESSION: Peripheral opacity at the symptomatic left base. Findings primarily concerning for lung infarct in this clinical setting. Chest CTA has already been ordered. Electronically Signed   By: Monte Fantasia M.D.   On: 07/13/2016 10:52   Ct Angio Chest Pe W And/or Wo Contrast  Result Date: 07/13/2016 CLINICAL DATA:  LEFT lower lateral chest and upper abdominal pain that awoke patient from sleep this morning, history DVT, history pulmonary embolism, similar type pain as prior pulmonary embolus EXAM: CT ANGIOGRAPHY CHEST WITH CONTRAST TECHNIQUE: Multidetector CT imaging of the chest was performed using the standard protocol during bolus administration of intravenous contrast. Multiplanar CT image reconstructions and MIPs were obtained to evaluate the vascular anatomy. CONTRAST:  70 cc Isovue 370 IV COMPARISON:  01/02/2014 FINDINGS: Cardiovascular: Aorta normal caliber without aneurysm or dissection. No pericardial effusion. Pulmonary arteries adequately opacified. Filling defects identified in LEFT upper lobe and RIGHT lower lobe pulmonary arteries consistent with pulmonary emboli. Elevated RV:LV ratio = 1.14  Mediastinum/Nodes: No thoracic adenopathy. Small hiatal hernia. Base of cervical region unremarkable. Lungs/Pleura: Infiltrate at base of lingula. Minimal dependent atelectasis in both lungs with tiny amount  of LEFT pleural fluid. Remaining lungs clear. No pneumothorax. Upper Abdomen: Visualized upper abdomen unremarkable. Musculoskeletal: No acute osseous findings. Review of the MIP images confirms the above findings. IMPRESSION: Small pulmonary emboli identified in LEFT upper lobe and RIGHT lower lobe. Positive for acute PE with CT evidence of right heart strain (RV/LV Ratio = 1.14) consistent with at least submassive (intermediate risk) PE. The presence of right heart strain has been associated with an increased risk of morbidity and mortality. Please activate Code PE by paging [phone]. Small hiatal hernia. Critical Value/emergent results were called by telephone at the time of interpretation on 07/13/2016 at 11:43 am to Dr. Nat Christen , who verbally acknowledged these results. Electronically Signed   By: Lavonia Dana M.D.   On: 07/13/2016 11:43    Assessment & Plan by Problem: Active Problems:   Pulmonary emboli (HCC)  Recurrent unprovoked bilateral pulmonary emboli, submassive: Previous unprovoked embolism in 2014 medically managed with xarelto. He currently takes no anticoagulants. Work-up in 2015 consisting of protein C and S levels, lupus anticoagulants, prothrombin gene mutation, cardiolipin antibodies, TSH, PT-INR, homocysteine, factor 5 leiden, beta-2-glycoprotein antibodies, and antithrombin III all found to be unremarkable. Current risk factors include smoking, sedentary lifestyle, and prior PE only. There is a concern for malignancy, though CT head, CT chest, and CT abdomen/pelvis in 2015 unremarkable; CBC w/ diff normal, no lymphadenopathy or fevers.   --discontinue heparin gtt. Start subq lovenox today.  --Toradol 30 mg q6h prn for pain  --EKG  --O2 prn  --CBC in the am  --heparin  level pending  FEN/GI: regular diet  DVT prophylaxis: lovenox (per pharm, PE dosing)  Code status: FULL CODE  Disposition: Admit to [facility]. Anticipate stay 2-3 days.  This is a Careers information officer Note.  The care of the patient was discussed with Dr. Posey Pronto and the assessment and plan was formulated with their assistance.  Please see their note for official documentation of the patient encounter.   Signed: Pershing Cox, Medical Student 07/13/2016, 2:00 PM

## 2016-07-13 NOTE — Progress Notes (Signed)
ANTICOAGULATION CONSULT NOTE - Initial Consult  Pharmacy Consult for heparin Indication: pulmonary embolus  Allergies  Allergen Reactions  . Haldol [Haloperidol] Shortness Of Breath    Patient Measurements: Height: 5\' 10"  (177.8 cm) Weight: 195 lb (88.5 kg) IBW/kg (Calculated) : 73 Heparin Dosing Weight: 88.5kg  Vital Signs: Temp: 99.1 F (37.3 C) (08/16 0627) Temp Source: Oral (08/16 0627) BP: 138/92 (08/16 1109) Pulse Rate: 98 (08/16 1109)  Labs:  Recent Labs  07/13/16 0855  HGB 13.4  HCT 40.8  PLT 251  CREATININE 1.10    Estimated Creatinine Clearance: 108 mL/min (by C-G formula based on SCr of 1.1 mg/dL).   Medical History: Past Medical History:  Diagnosis Date  . Mental disorder     Medications:  Infusions:  . heparin      Assessment: 30 yom with a history of PE presented to the ED with side pain. CT found to be positive for PE with right heart strain. To start IV heparin. Patient informed me that he is no longer on oral anticoagulation PTA. Baseline CBC is WNL.  Goal of Therapy:  Heparin level 0.3-0.7 units/ml Monitor platelets by anticoagulation protocol: Yes   Plan:  - Heparin bolus 5000 units IV x 1 - Heparin gtt 1400 units/hr - Check a 6 hr heparin level - Daily heparin level and CBC - F/u plans for long-term anticoagulation  Shivani Barrantes, Drake Leachachel Lynn 07/13/2016,12:36 PM

## 2016-07-13 NOTE — Progress Notes (Signed)
ANTICOAGULATION CONSULT NOTE - Initial Consult  Pharmacy Consult for heparin to Lovenox Indication: pulmonary embolus  Allergies  Allergen Reactions  . Haldol [Haloperidol] Shortness Of Breath    Patient Measurements: Height: 5\' 10"  (177.8 cm) Weight: 195 lb (88.5 kg) IBW/kg (Calculated) : 73 Heparin Dosing Weight: 88.5kg  Vital Signs: Temp: 99.5 F (37.5 C) (08/16 1442) Temp Source: Oral (08/16 1442) BP: 144/93 (08/16 1442) Pulse Rate: 115 (08/16 1442)  Labs:  Recent Labs  07/13/16 0855  HGB 13.4  HCT 40.8  PLT 251  CREATININE 1.10    Estimated Creatinine Clearance: 108 mL/min (by C-G formula based on SCr of 1.1 mg/dL).   Medical History: Past Medical History:  Diagnosis Date  . Mental disorder     Medications:  Infusions:     Assessment: [age] yom with a history of PE presented to the ED with side pain. CT found to be positive for PE with right heart strain. Patient informed us that he is no longer on oral anticoagulation PTA. Baseline CBC is WNL.  Transitioning from heparin to Lovenox  Goal of Therapy:  Heparin level 0.3-0.7 units/ml Monitor platelets by anticoagulation protocol: Yes   Plan:  DC heparin Lovenox 90 mg sq Q 12 hours DC heparin labs Follow CBC  Thank you Okey RegalLisa Zenda Herskowitz, PharmD [phone] 07/13/2016,3:36 PM

## 2016-07-13 NOTE — ED Triage Notes (Signed)
Pt states he started having sever sharp pains left side while asleep; pt states he has hx of DVT and it feel the same; Pt states pain at 8/10; Pt a&ox 4 on arrival.

## 2016-07-14 LAB — CBC
HEMATOCRIT: 38.3 % — AB (ref 39.0–52.0)
HEMOGLOBIN: 12.3 g/dL — AB (ref 13.0–17.0)
MCH: 27.9 pg (ref 26.0–34.0)
MCHC: 32.1 g/dL (ref 30.0–36.0)
MCV: 86.8 fL (ref 78.0–100.0)
Platelets: 211 10*3/uL (ref 150–400)
RBC: 4.41 MIL/uL (ref 4.22–5.81)
RDW: 13.5 % (ref 11.5–15.5)
WBC: 8.1 10*3/uL (ref 4.0–10.5)

## 2016-07-14 MED ORDER — RIVAROXABAN 20 MG PO TABS: 20.0000 mg | ORAL_TABLET | Freq: Every day | ORAL | Status: DC

## 2016-07-14 MED ORDER — RIVAROXABAN 15 MG PO TABS
15.0000 mg | ORAL_TABLET | Freq: Two times a day (BID) | ORAL | Status: DC
  Administered 2016-07-14: 15 mg via ORAL
  Filled 2016-07-14: qty 1

## 2016-07-14 MED ORDER — RIVAROXABAN 15 MG PO TABS: 15.0000 mg | ORAL_TABLET | Freq: Two times a day (BID) | ORAL | 0 refills | Status: DC

## 2016-07-14 MED ORDER — RIVAROXABAN 20 MG PO TABS: 20.0000 mg | ORAL_TABLET | Freq: Every day | ORAL | 0 refills | Status: DC

## 2016-07-14 MED ORDER — KETOROLAC TROMETHAMINE 30 MG/ML IJ SOLN
30.0000 mg | Freq: Once | INTRAMUSCULAR | Status: AC
  Administered 2016-07-14: 30 mg via INTRAVENOUS
  Filled 2016-07-14: qty 1

## 2016-07-14 NOTE — Discharge Instructions (Signed)
Thank you for trusting us with your medical care!  You were hospitalized for blood clots in your lung and treated with blood thinners.   Please take note of the following changes to your medications: -START rivoroxaban 15mg  twice daily   To make sure you are getting better, please make it to the follow-up appointments listed on the first page.  If you have any questions, please call [phone].  Information on my medicine - XARELTO (rivaroxaban)  This medication education was reviewed with me or my healthcare representative as part of my discharge preparation.   WHY WAS XARELTO PRESCRIBED FOR YOU? Xarelto was prescribed to treat blood clots that may have been found in the veins of your legs (deep vein thrombosis) or in your lungs (pulmonary embolism) and to reduce the risk of them occurring again.  What do you need to know about Xarelto? The starting dose is one 15 mg tablet taken TWICE daily with food for the FIRST 21 DAYS then on 9/8  the dose is changed to one 20 mg tablet taken ONCE A DAY with your evening meal.  DO NOT stop taking Xarelto without talking to the health care provider who prescribed the medication.  Refill your prescription for 20 mg tablets before you run out.  After discharge, you should have regular check-up appointments with your healthcare provider that is prescribing your Xarelto.  In the future your dose may need to be changed if your kidney function changes by a significant amount.  What do you do if you miss a dose? If you are taking Xarelto TWICE DAILY and you miss a dose, take it as soon as you remember. You may take two 15 mg tablets (total 30 mg) at the same time then resume your regularly scheduled 15 mg twice daily the next day.  If you are taking Xarelto ONCE DAILY and you miss a dose, take it as soon as you remember on the same day then continue your regularly scheduled once daily regimen the next day. Do not take two doses of Xarelto at the  same time.   Important Safety Information Xarelto is a blood thinner medicine that can cause bleeding. You should call your healthcare provider right away if you experience any of the following: ? Bleeding from an injury or your nose that does not stop. ? Unusual colored urine (red or dark brown) or unusual colored stools (red or black). ? Unusual bruising for unknown reasons. ? A serious fall or if you hit your head (even if there is no bleeding).  Some medicines may interact with Xarelto and might increase your risk of bleeding while on Xarelto. To help avoid this, consult your healthcare provider or pharmacist prior to using any new prescription or non-prescription medications, including herbals, vitamins, non-steroidal anti-inflammatory drugs (NSAIDs) and supplements.  This website has more information on Xarelto: [email or web address].xarelto.[email or web address].

## 2016-07-14 NOTE — Discharge Summary (Signed)
Name: Cole Barrett MRN: [id number] DOB: 10/02/1986 30 y.o. PCP: Ambrose FinlandValerie A Keck, NP  Date of Admission: 07/13/2016  7:53 AM Date of Discharge: 07/14/2016 Attending Physician: Levert FeinsteinJames M Granfortuna, MD  Discharge Diagnosis: Active Problems:   Pulmonary emboli [facility](HCC)   Discharge Medications:   Medication List    STOP taking these medications   benztropine 1 MG tablet Commonly known as:  COGENTIN   omeprazole 20 MG capsule Commonly known as:  PRILOSEC   risperiDONE microspheres 50 MG injection Commonly known as:  RISPERDAL CONSTA   simvastatin 10 MG tablet Commonly known as:  ZOCOR     TAKE these medications   Rivaroxaban 15 MG Tabs tablet Commonly known as:  XARELTO Take 1 tablet (15 mg total) by mouth 2 (two) times daily with a meal. What changed:  medication strength  how much to take  when to take this   rivaroxaban 20 MG Tabs tablet Commonly known as:  XARELTO Take 1 tablet (20 mg total) by mouth daily with supper. Start taking on:  08/05/2016 What changed:  You were already taking a medication with the same name, and this prescription was added. Make sure you understand how and when to take each.       Disposition and follow-up:   Cole Barrett [facility] St[facility].     Follow-up Appointments: Follow-up Information    [facility] .   Why:  Appointment scheduled for Wednesday, July 27, 2016 at 9am, please call if you are unable to keep this appointment.  Contact information: [address] [place] [phone]       [facility] .   Specialty:  Behavioral Health Why:  Info given to pt . This is a walk in only clinic, pt must present to clinic for care.  Contact information: [address] [place] [address] [phone]        Levert FeinsteinJAMES M GRANFORTUNA, MD. Go on 09/06/2016.   Specialty:  Oncology Why:  You have an  appointment with Dr. Cephas DarbyJames Granfortuna [facility] on Tuesday, October 10th at 1:30 pm. Contact information: [address] [place] [address] [phone]           Hospital Course by problem list: Active Problems:   Pulmonary emboli (HCC)   Unprovoked submassive pulmonary emboli: CT angiogram of the chest on admission [see details below] demonstrated submassive pulmonary emboli in the left and right upper lobes. He was placed on intravenous heparin and remain hemodynamically stable. He was transitioned off heparin to lovenox 90 mg twice daily and then rivoroxaban at the time of discharge. Pain was controlled with IV Toradol. -Please assess adherence to rivoroxaban [15mg  twice daily, 07/14/16-08/05/16, with 20mg  once daily with supper thereafter] as he had unprovoked PE in 2015 for which he was treated with a 483-month course of rivoroxaban  Mental health: He was [facility] which [facility]. None of the previously listed psychiatric medications were continued as an inpatient. -Please reconcile any psychiatric medications at follow-up  Discharge Vitals:   BP 133/71   Pulse (!) 102   Temp 98.4 F (36.9 C)   Resp 20   Ht 5\' 10"  (1.778 m)   Wt 195 lb (88.5 kg)   SpO2 97%   BMI 27.98 kg/m   Pertinent Labs, Studies, and Procedures:  CT ANGIOGRAPHY CHEST WITH CONTRAST 07/13/16  TECHNIQUE: Multidetector CT  imaging of the chest was performed using the standard protocol during bolus administration of intravenous contrast. Multiplanar CT image reconstructions and MIPs were obtained to evaluate the vascular anatomy.  CONTRAST:  70 cc Isovue 370 IV  COMPARISON:  01/02/2014  FINDINGS: Cardiovascular: Aorta normal caliber without aneurysm or dissection. No pericardial effusion. Pulmonary arteries adequately opacified. Filling defects identified in LEFT upper lobe and RIGHT lower lobe pulmonary arteries consistent with pulmonary  emboli. Elevated RV:LV ratio = 1.14  Mediastinum/Nodes: No thoracic adenopathy. Small hiatal hernia. Base of cervical region unremarkable.  Lungs/Pleura: Infiltrate at base of lingula. Minimal dependent atelectasis in both lungs with tiny amount of LEFT pleural fluid. Remaining lungs clear. No pneumothorax.  Upper Abdomen: Visualized upper abdomen unremarkable.  Musculoskeletal: No acute osseous findings.  Review of the MIP images confirms the above findings.  IMPRESSION: Small pulmonary emboli identified in LEFT upper lobe and RIGHT lower lobe.  Positive for acute PE with CT evidence of right heart strain (RV/LV Ratio = 1.14) consistent with at least submassive (intermediate risk) PE. The presence of right heart strain has been associated with an increased risk of morbidity and mortality. Please activate Code PE by paging [phone].  Small hiatal hernia.  Discharge Instructions: Discharge Instructions    Call MD for:  extreme fatigue    Complete by:  As directed   Call MD for:  persistant dizziness or light-headedness    Complete by:  As directed   Call MD for:  severe uncontrolled pain    Complete by:  As directed   Call MD for:  temperature >100.4    Complete by:  As directed   Increase activity slowly    Complete by:  As directed      Signed: Beather Arbourushil V Yoselin Amerman, MD 07/14/2016, 2:43 PM   Pager: @MYPAGER @

## 2016-07-14 NOTE — Care Management Note (Signed)
Case Management Note  Patient Details  Name: Cole Barrett MRN: [id number] Date of Birth: [birth year]  Subjective/Objective:         CM following for progression and d/c planning.            Action/Plan: 07/14/2016 met with pt re d/c needs, PCP listed a Chari Manning, however pt states that he does not know who this is. This CM able to learn that this is a Designer, jewellery who was an Glass blower/designer of the [employer]. She is no longer at that clinic , however this pt was last seen at the clinic in 2015. He can again be followed at the clinic post d/c. First available appointment is Wednesday, July 27, 2016 @ 9am. Will explain this to the pt. The pt confirmed that he has Medicaid. States that he just needs pain medicine and medicine for his blood. This CM explained to the pt that his Medicaid will cover this with a minimal copay. Pt asked for information on [facility], the [facility]. The info on this clinic was given to the pt by Assist Director, R Young as this is a walk in clinic only. Pt seemed disinterested at that time, unclear if he will actually review the information. CM will check with the pt later today re if he understands the information.  Will follow for other d/c needs.   Expected Discharge Date:                  Expected Discharge Plan:  Home/Self Care  In-House Referral:  NA  Discharge planning Services  CM Consult, [facility]  Post Acute Care Choice:    Choice offered to:     DME Arranged:    DME Agency:     HH Arranged:    HH Agency:     Status of Service:  In process, will continue to follow  If discussed at Long Length of Stay Meetings, dates discussed:    Additional Comments:  Adron Bene, RN 07/14/2016, 11:00 AM

## 2016-07-14 NOTE — Progress Notes (Signed)
07/14/2016 10:59 AM  [facility]. Patient stated to leave on bedside table. Left business card as well. Informed Case Manager.   PACCAR Incyanne Hill BSN, RN-BC, [employer] [facility] Phone [phone]

## 2016-07-14 NOTE — Progress Notes (Signed)
   Subjective: This morning, he does not have any difficulty breathing, chest pain, coughing. He does have some left sided pain which has improved from yesterday. He feels he is ready to go home. He does not have a regular doctor, and he is [facility]. We emphasized the importance of taking rivaroxaban indefinitely given the fact that this is the second unprovoked blood clot.  Objective:  Vital signs in last 24 hours: Vitals:   07/13/16 1442 07/13/16 1705 07/13/16 2017 07/14/16 0526  BP: (!) 144/93 134/82 132/85 133/71  Pulse: (!) 115 (!) 110 (!) 112 (!) 102  Resp: 20 20 (!) 22 20  Temp: 99.5 F (37.5 C) 98.9 F (37.2 C) 99.1 F (37.3 C) 98.4 F (36.9 C)  TempSrc: Oral Oral    SpO2: 98% 98% 99% 97%  Weight:      Height:       Physical Exam  Constitutional: No distress.  HENT:  Head: Normocephalic and atraumatic.  Eyes: Conjunctivae are normal. No scleral icterus.  Cardiovascular: Regular rhythm.   No murmur heard. Tachycardic  Pulmonary/Chest: Effort normal. No respiratory distress.  Skin: Skin is warm and dry. He is not diaphoretic.  Psychiatric:  Blunted affect   Assessment/Plan:  Active Problems:   Pulmonary emboli [facility](HCC)  Cole Barrett is a 30 year old male hospitalized for bilateral submassive pulmonary emboli.  Unprovoked bilateral submassive pulmonary emboli: He has remained hemodynamically stable overnight and is not requiring oxygen. He is able to ambulate without increased respiratory effort. Given the risks of recurrent embolic disease, we emphasized to the patient he will need to be on anticoagulation indefinitely. -Transition Lovenox to rivaroxaban today with assistance from pharmacy. He will start 15 mg twice daily and continue this dose until September 8 at which time he'll be switched over to 20 mg once daily with dinner. -Consult case management to arrange for close follow-up with PCP -Schedule follow-up with Dr.  Cyndie ChimeGranfortuna [Hematology]  Dispo: Anticipated discharge today.   Cole Arbourushil V Slayden Mennenga, MD 07/14/2016, 11:15 AM Pager: @MYPAGER @

## 2016-07-14 NOTE — Progress Notes (Signed)
Subjective: Cole Barrett is feeling well this morning. He is able to ambulate without increased pain or shortness of breath. Continues to have some left sided pain but is overall improved and ready to go home.  Objective: Vital signs in last 24 hours: Vitals:   07/13/16 1442 07/13/16 1705 07/13/16 2017 07/14/16 0526  BP: (!) 144/93 134/82 132/85 133/71  Pulse: (!) 115 (!) 110 (!) 112 (!) 102  Resp: 20 20 (!) 22 20  Temp: 99.5 F (37.5 C) 98.9 F (37.2 C) 99.1 F (37.3 C) 98.4 F (36.9 C)  TempSrc: Oral Oral    SpO2: 98% 98% 99% 97%  Weight:      Height:       Weight change:   Intake/Output Summary (Last 24 hours) at 07/14/16 1008 Last data filed at 07/14/16 0600  Gross per 24 hour  Intake              594 ml  Output                0 ml  Net              594 ml   Physical Exam: General appearance: well-developed African American male in NAD. Cooperative. Lungs: CTA with decreased breath sounds bilaterally. No wheezes, rales, or crackles. Heart: RRR. No murmurs, rubs, or gallops. Abdomen: BS+ Extremities: Skin warm and dry. Pulses 2+ TP. Trace pitting edema LLE to mid-tibia. LLE's symmetric and non-tender.  Lab Results: Component     Latest Ref Rng & Units 07/13/2016 07/14/2016  Color, Urine     YELLOW YELLOW   Appearance     CLEAR CLEAR   Specific Gravity, Urine     1.005 - 1.030 1.039 (H)   pH     5.0 - 8.0 6.0   Glucose     NEGATIVE mg/dL NEGATIVE   Hgb urine dipstick     NEGATIVE NEGATIVE   Bilirubin Urine     NEGATIVE NEGATIVE   Ketones, ur     NEGATIVE mg/dL NEGATIVE   Protein     NEGATIVE mg/dL NEGATIVE   Nitrite     NEGATIVE NEGATIVE   Leukocytes, UA     NEGATIVE NEGATIVE   WBC     4.0 - 10.5 K/uL  8.1  RBC     4.22 - 5.81 MIL/uL  4.41  Hemoglobin     13.0 - 17.0 g/dL  16.112.3 (L)  HCT     09.639.0 - 52.0 %  38.3 (L)  MCV     78.0 - 100.0 fL  86.8  MCH     26.0 - 34.0 pg  27.9  MCHC     30.0 - 36.0 g/dL  04.532.1  RDW     40.911.5 - 81.115.5 %  13.5    Platelets     150 - 400 K/uL  211   Studies/Results: No new results.  Medications: Scheduled Meds: . rivaroxaban  15 mg Oral BID WC   Followed by  . [START ON 08/05/2016] rivaroxaban  20 mg Oral Q supper   Continuous Infusions:  PRN Meds:.ketorolac   Assessment/Plan: Active Problems:   Pulmonary emboli (HCC)  Recurrent unprovoked bilateral pulmonary emboli, submassive: Hemodynamically stable. Previous unprovoked embolism in 2014 medically managed with a few months of xarelto. 2015 hypercoagulability panel negative. No signs of malignancy clinically. Hgb, WBC, platelets stable.                       --  stop Lovenox. Start xarelto 15 mg bid (first dose this evening)                       --D/C toradol at discharge            --[facility] and with Dr. Cyndie ChimeGranfortuna  FEN/GI: regular diet  DVT prophylaxis: xarelto, ambulation  Code status: FULL CODE  Disposition: Discharge to home this evening after first dose of xarelto  This is a Psychologist, occupationalMedical Student Note.  The care of the patient was discussed with Dr. Allena KatzPatel and the assessment and plan formulated with their assistance.  Please see their attached note for official documentation of the daily encounter.   LOS: 1 day   Juanna Caoaylor Halo Laski, Medical Student 07/14/2016, 10:08 AM

## 2016-07-14 NOTE — Progress Notes (Signed)
Patient Discharge: Disposition: Patient discharged to home. Education: Reviewed the medications, prescriptions, discharge instructions, and follow-up appointments, understood and acknowledged. IV: Discontinued IV before discharge. Telemetry: N/A Transportation: Patient refused to be escorted and walked out of the unit with his belongings.

## 2016-07-14 NOTE — Progress Notes (Addendum)
ANTICOAGULATION CONSULT NOTE - Initial Consult  Pharmacy Consult for Lovenox to xarelto Indication: pulmonary embolus  Allergies  Allergen Reactions  . Haldol [Haloperidol] Shortness Of Breath    Patient Measurements: Height: 5\' 10"  (177.8 cm) Weight: 195 lb (88.5 kg) IBW/kg (Calculated) : 73 Heparin Dosing Weight: 88.5kg  Vital Signs: Temp: 98.4 F (36.9 C) (08/17 0526) BP: 133/71 (08/17 0526) Pulse Rate: 102 (08/17 0526)  Labs:  Recent Labs  07/13/16 0855 07/14/16 0509  HGB 13.4 12.3*  HCT 40.8 38.3*  PLT 251 211  CREATININE 1.10  --     Estimated Creatinine Clearance: 108 mL/min (by C-G formula based on SCr of 1.1 mg/dL).   Medical History: Past Medical History:  Diagnosis Date  . Mental disorder     Medications:  Infusions:     Assessment: [age] yom with a history of PE presented to the ED with side pain. CT found to be positive for PE with right heart strain. He was initially started on heparin, then switched to lovenox, he received lovenox 90mg  this morning at 0530. Plan to switch lovenox to xarelto now. His renal function is normal. CBC wnl.  Of note, he had history of PE in 2014, and was treated with xarelto for a few months. He will need to be on life-long anticoagulation.  Goal of Therapy:  Monitor platelets by anticoagulation protocol: Yes   Plan:  D/c Lovenox Xarelto 15 mg po BID first dose today at 1700 with PM meal Switch xarelto to 20 mg daily on 9/8   Bayard HuggerMei Mena Simonis, PharmD, BCPS  Clinical Pharmacist  Pager: [phone]   07/14/2016,10:01 AM

## 2016-07-15 ENCOUNTER — Emergency Department (HOSPITAL_COMMUNITY)
Admission: EM | Admit: 2016-07-15 | Discharge: 2016-07-15 | Disposition: A | Discharge status: Home / Self Care | Payer: Medicaid Other | Attending: Emergency Medicine | Admitting: Emergency Medicine

## 2016-07-15 ENCOUNTER — Emergency Department (HOSPITAL_COMMUNITY): Payer: Medicaid Other

## 2016-07-15 ENCOUNTER — Encounter (HOSPITAL_COMMUNITY): Payer: Self-pay | Admitting: Emergency Medicine

## 2016-07-15 DIAGNOSIS — F1290 Cannabis use, unspecified, uncomplicated: Secondary | ICD-10-CM | POA: Diagnosis not present

## 2016-07-15 DIAGNOSIS — R079 Chest pain, unspecified: Secondary | ICD-10-CM | POA: Diagnosis present

## 2016-07-15 DIAGNOSIS — I2699 Other pulmonary embolism without acute cor pulmonale: Secondary | ICD-10-CM | POA: Diagnosis not present

## 2016-07-15 DIAGNOSIS — F17210 Nicotine dependence, cigarettes, uncomplicated: Secondary | ICD-10-CM | POA: Diagnosis not present

## 2016-07-15 LAB — COMPREHENSIVE METABOLIC PANEL
ALBUMIN: 4.1 g/dL (ref 3.5–5.0)
ALT: 31 U/L (ref 17–63)
AST: 23 U/L (ref 15–41)
Alkaline Phosphatase: 41 U/L (ref 38–126)
Anion gap: 8 (ref 5–15)
BILIRUBIN TOTAL: 1 mg/dL (ref 0.3–1.2)
BUN: 11 mg/dL (ref 6–20)
CHLORIDE: 105 mmol/L (ref 101–111)
CO2: 24 mmol/L (ref 22–32)
Calcium: 8.8 mg/dL — ABNORMAL LOW (ref 8.9–10.3)
Creatinine, Ser: 1.01 mg/dL (ref 0.61–1.24)
GFR calc Af Amer: >60 mL/min (ref >60)
GFR calc non Af Amer: >60 mL/min (ref >60)
Glucose, Bld: 96 mg/dL (ref 65–99)
POTASSIUM: 4.4 mmol/L (ref 3.5–5.1)
SODIUM: 137 mmol/L (ref 135–145)
Total Protein: 8 g/dL (ref 6.5–8.1)

## 2016-07-15 LAB — CBC WITH DIFFERENTIAL/PLATELET
Basophils Absolute: 0 10*3/uL (ref 0.0–0.1)
Basophils Relative: 0 %
EOS PCT: 1 %
Eosinophils Absolute: 0.1 10*3/uL (ref 0.0–0.7)
HEMATOCRIT: 35 % — AB (ref 39.0–52.0)
HEMOGLOBIN: 11.7 g/dL — AB (ref 13.0–17.0)
LYMPHS ABS: 1.3 10*3/uL (ref 0.7–4.0)
LYMPHS PCT: 16 %
MCH: 28.1 pg (ref 26.0–34.0)
MCHC: 33.4 g/dL (ref 30.0–36.0)
MCV: 83.9 fL (ref 78.0–100.0)
Monocytes Absolute: 0.7 10*3/uL (ref 0.1–1.0)
Monocytes Relative: 8 %
NEUTROS ABS: 6.3 10*3/uL (ref 1.7–7.7)
NEUTROS PCT: 75 %
Platelets: 240 10*3/uL (ref 150–400)
RBC: 4.17 MIL/uL — AB (ref 4.22–5.81)
RDW: 13.3 % (ref 11.5–15.5)
WBC: 8.4 10*3/uL (ref 4.0–10.5)

## 2016-07-15 LAB — I-STAT CG4 LACTIC ACID, ED: Lactic Acid, Venous: 1.16 mmol/L (ref 0.5–1.9)

## 2016-07-15 MED ORDER — RIVAROXABAN 15 MG PO TABS
15.0000 mg | ORAL_TABLET | Freq: Once | ORAL | Status: AC
  Administered 2016-07-15: 15 mg via ORAL
  Filled 2016-07-15: qty 1

## 2016-07-15 MED ORDER — TRAMADOL HCL 50 MG PO TABS
50.0000 mg | ORAL_TABLET | Freq: Once | ORAL | Status: AC
  Administered 2016-07-15: 50 mg via ORAL
  Filled 2016-07-15: qty 1

## 2016-07-15 NOTE — ED Provider Notes (Signed)
[facility]-EMERGENCY DEPT Provider Note   CSN: [id number] Arrival date & time: 07/15/16  0324     History   Chief Complaint Chief Complaint  Patient presents with  . Chest Pain    HPI Cole Barrett is a 30 y.o. male.  The history is provided by the patient.  Chest Pain    He was discharged from the hospital yesterday after being diagnosed with pulmonary embolism. He had recurrence of 5 left-sided chest pain at home. He states he has not been able to get his prescriptions filled. He rates pain at 8/10. He denies fever, chills, sweats. There has been some intermittent dyspnea. He denies nausea or vomiting. He is not coughing.  Past Medical History:  Diagnosis Date  . Mental disorder     Patient Active Problem List   Diagnosis Date Noted  . Pulmonary emboli (HCC) 07/13/2016  . Adjustment disorder with anxiety 03/12/2014  . 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 Acute left flank pain 01/02/2014  . Pulmonary embolism, bilateral (HCC) 01/02/2014    History reviewed. No pertinent surgical history.     Home Medications    Prior to Admission medications   Medication Sig Start Date End Date Taking? Authorizing Provider  Rivaroxaban (XARELTO) 15 MG TABS tablet Take 1 tablet (15 mg total) by mouth 2 (two) times daily with a meal. 07/14/16   Beather Arbourushil V Patel, MD  rivaroxaban (XARELTO) 20 MG TABS tablet Take 1 tablet (20 mg total) by mouth daily with supper. 08/05/16   Beather Arbourushil V Patel, MD    Family History History reviewed. No pertinent family history.  Social History Social History  Substance Use Topics  . Smoking status: Current Every Day Smoker    Packs/day: 0.30    Years: 0.50    Types: Cigarettes  . Smokeless tobacco: Never Used  . Alcohol use Yes     Comment: 1 40 oz per day     Allergies   Haldol [haloperidol]   Review of Systems Review of Systems  Cardiovascular: Positive for chest pain.  All other systems reviewed and are  negative.    Physical Exam Updated Vital Signs BP 138/88 (BP Location: Right Arm)   Pulse 117   Temp 100.7 F (38.2 C) (Oral)   Resp 26   SpO2 96%   Physical Exam  Nursing note and vitals reviewed.  30 year old male, resting comfortably and in no acute distress. He is completely nontoxic in appearance. Vital signs are significant for low-grade fever, tachycardia, tachypnea. Oxygen saturation is 96%, which is normal. Head is normocephalic and atraumatic. PERRLA, EOMI. Oropharynx is clear. Neck is nontender and supple without adenopathy or JVD. Back is nontender and there is no CVA tenderness. Lungs are clear without rales, wheezes, or rhonchi. Chest is nontender. Heart has regular rate and rhythm without murmur. Abdomen is soft, flat, nontender without masses or hepatosplenomegaly and peristalsis is normoactive. Extremities have no cyanosis or edema, full range of motion is present. Skin is warm and dry without rash. Neurologic: Mental status is normal, cranial nerves are intact, there are no motor or sensory deficits.  ED Treatments / Results  Labs (all labs ordered are listed, but only abnormal results are displayed) Labs Reviewed  CULTURE, BLOOD (ROUTINE X 2)  CULTURE, BLOOD (ROUTINE X 2)  COMPREHENSIVE METABOLIC PANEL  CBC WITH DIFFERENTIAL/PLATELET  URINALYSIS, ROUTINE W REFLEX MICROSCOPIC (NOT AT [facility])  I-STAT CG4 LACTIC ACID, ED    EKG  EKG Interpretation  Date/Time:  Friday July 15 2016 03:43:07 EDT Ventricular Rate:  135 PR Interval:    QRS Duration: 76 QT Interval:  274 QTC Calculation: 411 R Axis:   71 Text Interpretation:  Sinus tachycardia Borderline T abnormalities, inferior leads ST elev, probable normal early repol pattern No old tracing to compare Confirmed by [facility]  MD, Tyeson Tanimoto ([id number]) on 07/15/2016 3:59:05 AM       Radiology Dg Chest 2 View  Result Date: 07/15/2016 CLINICAL DATA:  Increased pain and shortness of breath this morning. Smoker.  Diagnosed with blood clots. EXAM: CHEST  2 VIEW COMPARISON:  CT chest 07/13/2016.  Chest 07/13/2016. FINDINGS: Shallow inspiration. Mild cardiac enlargement with normal pulmonary vascularity. Focal consolidation in the left lung base similar to prior study. No blunting of costophrenic angles. No pneumothorax. Mediastinal contours appear intact. IMPRESSION: Mild cardiac enlargement. Focal consolidation in the left lung base. No change since prior study. Electronically Signed   By: Burman NievesWilliam  Stevens M.D.   On: 07/15/2016 05:09   Procedures Procedures (including critical care time)  Medications Ordered in ED Medications  Rivaroxaban (XARELTO) tablet 15 mg (not administered)  traMADol (ULTRAM) tablet 50 mg (not administered)     Initial Impression / Assessment and Plan / ED Course  I have reviewed the triage vital signs and the nursing notes.  Pertinent labs & imaging results that were available during my care of the patient were reviewed by me and considered in my medical decision making (see chart for details).  Clinical Course    Persistent chest pain following diagnosis of pulmonary embolism. Fever and tachycardia are present, so he will be screened for sepsis. Patient does not appear septic. Chest x-rays obtained which showed shows a focal area of consolidation in the left lower lobe lung field, but unchanged from previous x-ray and not likely to represent pneumonia. I reviewed his medication administration from his recent hospitalization and he received his last dose of rivaroxaban at 1600 yesterday, so he has not actually missed any doses of the anticoagulant. He is given a dose of rivaroxaban here. His discharge pulse rate was 110-not significantly different from his pulse rate here.  Final Clinical Impressions(s) / ED Diagnoses   Final diagnoses:  Other acute pulmonary embolism without acute cor pulmonale (HCC)    New Prescriptions New Prescriptions   No medications on file      Dione Boozeavid Reyonna Haack, MD 07/15/16 [phone]

## 2016-07-15 NOTE — ED Notes (Signed)
Informed patient of need for urine sample, pt states he just urinated and cannot urinate at this time. Plan: patient to provide urine sample when able. Urinal left at bedside.

## 2016-07-15 NOTE — ED Triage Notes (Signed)
Pt [facility] and was diagnosed with a blood clot in his left lung  Pt states he was discharged on Thursday  Pt states tonight he is having pain in his left chest and is having some shortness of breath  Pt is coughing in triage  Pt states he thinks he was discharged too soon

## 2016-07-15 NOTE — Discharge Instructions (Signed)
Gets your prescriptions filled and take them as directed. It is very important that you take the rivaroxaban (Xarelto) exactly as prescribed. Return if breathing is getting worse or if you start running a fever.

## 2016-07-15 NOTE — ED Notes (Addendum)
Error in charting.

## 2016-07-20 LAB — CULTURE, BLOOD (ROUTINE X 2)
CULTURE: NO GROWTH
CULTURE: NO GROWTH

## 2016-07-22 ENCOUNTER — Encounter (HOSPITAL_COMMUNITY): Payer: Self-pay | Admitting: *Deleted

## 2016-07-22 ENCOUNTER — Emergency Department (HOSPITAL_COMMUNITY)
Admission: EM | Admit: 2016-07-22 | Discharge: 2016-07-22 | Disposition: A | Discharge status: Home / Self Care | Payer: Medicaid Other | Attending: Emergency Medicine | Admitting: Emergency Medicine

## 2016-07-22 DIAGNOSIS — Z7901 Long term (current) use of anticoagulants: Secondary | ICD-10-CM | POA: Diagnosis not present

## 2016-07-22 DIAGNOSIS — F17210 Nicotine dependence, cigarettes, uncomplicated: Secondary | ICD-10-CM | POA: Diagnosis not present

## 2016-07-22 DIAGNOSIS — I2699 Other pulmonary embolism without acute cor pulmonale: Secondary | ICD-10-CM | POA: Diagnosis not present

## 2016-07-22 HISTORY — DX: Other pulmonary embolism without acute cor pulmonale: I26.99

## 2016-07-22 NOTE — ED Provider Notes (Signed)
[facility]-EMERGENCY DEPT Provider Note   CSN: [id number] Arrival date & time: 07/22/16  [id number]  By signing my name below, I, Nelwyn SalisburyJoshua Fowler, attest that this documentation has been prepared under the direction and in the presence of Azalia BilisKevin Haliegh Khurana, MD . Electronically Signed: Nelwyn SalisburyJoshua Fowler, Scribe. 07/22/2016. 2:02 AM.   History   Chief Complaint Chief Complaint  Patient presents with  . Follow-up   HPI   HPI Comments:  Cole Barrett is a 30 y.o. male who presents to the Emergency Department for a follow-up visit s/p pulmonary embolism on 07/14/2016 requesting repeat CT scan. Pt denies any chest pain or shortness of breath and states he is doing well.    Past Medical History:  Diagnosis Date  . Mental disorder   . PE (pulmonary embolism)     Patient Active Problem List   Diagnosis Date Noted  . Pulmonary emboli (HCC) 07/13/2016  . Adjustment disorder with anxiety 03/12/2014  . 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 Acute left flank pain 01/02/2014  . Pulmonary embolism, bilateral (HCC) 01/02/2014    History reviewed. No pertinent surgical history.     Home Medications    Prior to Admission medications   Medication Sig Start Date End Date Taking? Authorizing Provider  Rivaroxaban (XARELTO) 15 MG TABS tablet Take 1 tablet (15 mg total) by mouth 2 (two) times daily with a meal. 07/14/16   Beather Arbourushil V Patel, MD  rivaroxaban (XARELTO) 20 MG TABS tablet Take 1 tablet (20 mg total) by mouth daily with supper. Patient not taking: Reported on 07/15/2016 08/05/16   Beather Arbourushil V Patel, MD    Family History History reviewed. No pertinent family history.  Social History Social History  Substance Use Topics  . Smoking status: Current Every Day Smoker    Packs/day: 0.30    Years: 0.50    Types: Cigarettes  . Smokeless tobacco: Never Used  . Alcohol use Yes     Comment: 1 40 oz per day     Allergies   Haldol [haloperidol]   Review of Systems Review of  Systems 10 Systems reviewed and are negative for acute change except as noted in the HPI.   Physical Exam Updated Vital Signs BP 139/96 (BP Location: Left Arm)   Pulse 95   Temp 98.1 F (36.7 C) (Oral)   Resp 20   SpO2 98%   Physical Exam  Constitutional: He is oriented to person, place, and time. He appears well-developed and well-nourished.  HENT:  Head: Normocephalic.  Eyes: EOM are normal.  Neck: Normal range of motion.  Pulmonary/Chest: Effort normal.  Abdominal: He exhibits no distension.  Musculoskeletal: Normal range of motion.  Neurological: He is alert and oriented to person, place, and time.  Psychiatric: He has a normal mood and affect.  Nursing note and vitals reviewed.    ED Treatments / Results  DIAGNOSTIC STUDIES:  Oxygen Saturation is 98% on RA, normal by my interpretation.    COORDINATION OF CARE:  2:01 AM Discussed treatment plan with pt at bedside and pt agreed to plan.  Labs (all labs ordered are listed, but only abnormal results are displayed) Labs Reviewed - No data to display  EKG  EKG Interpretation None       Radiology No results found.  Procedures Procedures (including critical care time)  Medications Ordered in ED Medications - No data to display   Initial Impression / Assessment and Plan / ED Course  I have reviewed the triage vital signs  and the nursing notes.  Pertinent labs & imaging results that were available during my care of the patient were reviewed by me and considered in my medical decision making (see chart for details).  Clinical Course    Patient is without chest pain or shortness of breath.  No hypoxia or tachycardia.  Well-appearing.  Requesting repeat CT imaging to evaluate the blood clot.  No indication for repeat CT imaging at this time.  Patient referred back to the [facility].  He is taking his medication and tolerating it well   Final Clinical Impressions(s) / ED Diagnoses   Final  diagnoses:  None    New Prescriptions New Prescriptions   No medications on file   I personally performed the services described in this documentation, which was scribed in my presence. The recorded information has been reviewed and is accurate.        Azalia Bilis, MD 07/22/16 [phone]

## 2016-07-22 NOTE — ED Triage Notes (Signed)
Pt wants a PE follow up. Pt saw dr today, dr instructed pt to come to ED for follow up PE study. Pt dx with PE a week ago. Pt currently on blood thinners and pain medications. Pt denies shortness of breath, CP.

## 2016-07-22 NOTE — ED Notes (Signed)
Pt verbalized understanding of discharge instructions and follow-up care. Pt denies pain or shortness of breath at this time. Ambulatory to the lobby at time of discharge.

## 2016-07-27 ENCOUNTER — Inpatient Hospital Stay: Payer: Self-pay | Admitting: Critical Care Medicine

## 2016-07-28 ENCOUNTER — Encounter (HOSPITAL_COMMUNITY): Payer: Self-pay | Admitting: Emergency Medicine

## 2016-07-28 ENCOUNTER — Emergency Department (HOSPITAL_COMMUNITY)
Admission: EM | Admit: 2016-07-28 | Discharge: 2016-07-28 | Disposition: A | Discharge status: Home / Self Care | Payer: Medicaid Other | Attending: Emergency Medicine | Admitting: Emergency Medicine

## 2016-07-28 DIAGNOSIS — F17210 Nicotine dependence, cigarettes, uncomplicated: Secondary | ICD-10-CM | POA: Insufficient documentation

## 2016-07-28 DIAGNOSIS — Z79899 Other long term (current) drug therapy: Secondary | ICD-10-CM | POA: Diagnosis not present

## 2016-07-28 DIAGNOSIS — Z86711 Personal history of pulmonary embolism: Secondary | ICD-10-CM | POA: Diagnosis not present

## 2016-07-28 DIAGNOSIS — I2699 Other pulmonary embolism without acute cor pulmonale: Secondary | ICD-10-CM | POA: Diagnosis present

## 2016-07-28 NOTE — Discharge Instructions (Signed)
Continue taking your Xarelto for treatment of a blood clot. [facility] with [facility] to ensure resolution of your blood clot. Return for fever over 100.65F, loss of consciousness, severe shortness of breath, or significant chest pain.

## 2016-07-28 NOTE — ED Provider Notes (Signed)
[facility]-EMERGENCY DEPT Provider Note   CSN: [id number] Arrival date & time: 07/28/16  0104 By signing my name below, I, Cole Barrett, attest that this documentation has been prepared under the direction and in the presence of non-physician practitioner, Antony MaduraKelly Rilyn Scroggs, PA-C Electronically Signed: Levon HedgerElizabeth Barrett, Scribe. 07/28/2016. 1:34 AM.   History   Chief Complaint Chief Complaint  Patient presents with  . Pulmonary Embolism    HPI Cole Barrett is a 30 y.o. male with hx of PE and  who presents to the Emergency Department requesting a CT scan. Pt states he is here to see if he has "any blood clots in my body". He reports he has been taking his blood thinners and pain medication. [facility] pt[facility]/u to see how his PE is resolving. Pt denies any CP or SOB. Pt has no other complaints at this time.   The history is provided by the patient. No language interpreter was used.    Past Medical History:  Diagnosis Date  . Mental disorder   . PE (pulmonary embolism)     Patient Active Problem List   Diagnosis Date Noted  . Pulmonary emboli (HCC) 07/13/2016  . Adjustment disorder with anxiety 03/12/2014  . 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 Acute left flank pain 01/02/2014  . Pulmonary embolism, bilateral (HCC) 01/02/2014   History reviewed. No pertinent surgical history.   Home Medications    Prior to Admission medications   Medication Sig Start Date End Date Taking? Authorizing Provider  Rivaroxaban (XARELTO) 15 MG TABS tablet Take 1 tablet (15 mg total) by mouth 2 (two) times daily with a meal. 07/14/16  Yes Beather Arbourushil V Patel, MD  rivaroxaban (XARELTO) 20 MG TABS tablet Take 1 tablet (20 mg total) by mouth daily with supper. Patient not taking: Reported on 07/15/2016 08/05/16   Beather Arbourushil V Patel, MD    Family History History reviewed. No pertinent family history.  Social History Social History  Substance Use Topics  . Smoking  status: Current Every Day Smoker    Packs/day: 0.30    Years: 0.50    Types: Cigarettes  . Smokeless tobacco: Never Used  . Alcohol use Yes     Comment: 1 40 oz per day     Allergies   Haldol [haloperidol]   Review of Systems Review of Systems  Respiratory: Negative for shortness of breath.   Cardiovascular: Negative for chest pain.  Ten systems reviewed and are negative for acute change, except as noted in the HPI.    Physical Exam Updated Vital Signs BP 128/74   Pulse 87   Temp 98 F (36.7 C) (Oral)   Resp 18   SpO2 97%   Physical Exam  Constitutional: He is oriented to person, place, and time. He appears well-developed and well-nourished. No distress.  Nontoxic appearing and in NAD  HENT:  Head: Normocephalic and atraumatic.  Eyes: Conjunctivae and EOM are normal. No scleral icterus.  Neck: Normal range of motion.  Cardiovascular: Normal rate, regular rhythm and intact distal pulses.   Pulmonary/Chest: Effort normal. No respiratory distress. He has no wheezes. He has no rales.  Lungs clear to auscultation bilaterally. Chest expansion symmetric. No hypoxia.  Musculoskeletal: Normal range of motion.  Neurological: He is alert and oriented to person, place, and time.  GCS 15. Patient moving all extremities.  Skin: Skin is warm and dry. No rash noted. He is not diaphoretic. No erythema. No pallor.  Psychiatric: He has a normal mood and affect. His behavior is normal.  Nursing note and vitals reviewed.    ED Treatments / Results  DIAGNOSTIC STUDIES:  Oxygen Saturation is 100% on RA, normal by my interpretation.    COORDINATION OF CARE:  1:32 AM Discussed treatment plan with pt at bedside and pt agreed to plan.   Labs (all labs ordered are listed, but only abnormal results are displayed) Labs Reviewed - No data to display  EKG  EKG Interpretation None       Radiology No results found.  Procedures Procedures (including critical care  time)  Medications Ordered in ED Medications - No data to display   Initial Impression / Assessment and Plan / ED Course  I have reviewed the triage vital signs and the nursing notes.  Pertinent labs & imaging results that were available during my care of the patient were reviewed by me and considered in my medical decision making (see chart for details).  Clinical Course    30 year old male presents to the emergency Department requesting a CT scan to evaluate for pulmonary embolus. He was diagnosed with PE 2 weeks ago and was started on Xarelto. He reports taking his Xarelto as prescribed. He has no complaints of chest pain or shortness of breath. Lungs CTAB and patient has no hypoxia or tachycardia. He states that he came tonight because the "doctor at [facility] told me I should have it checked out".   I have reassured the patient that he is on the appropriate treatment for pulmonary embolus. He has been instructed to follow-up with the [facility] and a primary care doctor to advise on further monitoring and management. No indication for further emergent workup at this time. Patient discharged in satisfactory condition.   Final Clinical Impressions(s) / ED Diagnoses   Final diagnoses:  Hx of pulmonary embolus    I personally performed the services described in this documentation, which was scribed in my presence. The recorded information has been reviewed and is accurate.    New Prescriptions New Prescriptions   No medications on file     [name], Cordelia Poche 07/28/16 0210    April Palumbo, MD 07/28/16 [phone]

## 2016-07-28 NOTE — ED Triage Notes (Signed)
Pt presents stating that he wants to be checked out following a pulmonary embolism. States that his doctor at [facility] wants him to have a f/u to see if it has resolved/isresolving. Pt is on Xarelto. Denies SOB, or CP at this time. Alert and oriented.

## 2016-08-21 ENCOUNTER — Encounter (HOSPITAL_COMMUNITY): Payer: Self-pay | Admitting: Emergency Medicine

## 2016-08-21 ENCOUNTER — Emergency Department (HOSPITAL_COMMUNITY)
Admission: EM | Admit: 2016-08-21 | Discharge: 2016-08-21 | Disposition: A | Discharge status: Home / Self Care | Payer: Medicaid Other | Attending: Dermatology | Admitting: Dermatology

## 2016-08-21 DIAGNOSIS — Z7901 Long term (current) use of anticoagulants: Secondary | ICD-10-CM | POA: Insufficient documentation

## 2016-08-21 DIAGNOSIS — F4322 Adjustment disorder with anxiety: Secondary | ICD-10-CM | POA: Diagnosis not present

## 2016-08-21 DIAGNOSIS — F4329 Adjustment disorder with other symptoms: Secondary | ICD-10-CM | POA: Diagnosis present

## 2016-08-21 DIAGNOSIS — Z5181 Encounter for therapeutic drug level monitoring: Secondary | ICD-10-CM | POA: Diagnosis not present

## 2016-08-21 DIAGNOSIS — F17210 Nicotine dependence, cigarettes, uncomplicated: Secondary | ICD-10-CM | POA: Insufficient documentation

## 2016-08-21 LAB — COMPREHENSIVE METABOLIC PANEL WITH GFR
ALT: 37 U/L (ref 17–63)
AST: 20 U/L (ref 15–41)
Albumin: 4 g/dL (ref 3.5–5.0)
Alkaline Phosphatase: 40 U/L (ref 38–126)
Anion gap: 7 (ref 5–15)
BUN: 14 mg/dL (ref 6–20)
CO2: 26 mmol/L (ref 22–32)
Calcium: 9 mg/dL (ref 8.9–10.3)
Chloride: 106 mmol/L (ref 101–111)
Creatinine, Ser: 1.11 mg/dL (ref 0.61–1.24)
GFR calc Af Amer: >60 mL/min
GFR calc non Af Amer: >60 mL/min
Glucose, Bld: 97 mg/dL (ref 65–99)
Potassium: 4.1 mmol/L (ref 3.5–5.1)
Sodium: 139 mmol/L (ref 135–145)
Total Bilirubin: 0.5 mg/dL (ref 0.3–1.2)
Total Protein: 7.7 g/dL (ref 6.5–8.1)

## 2016-08-21 LAB — CBC
HCT: 35.3 % — ABNORMAL LOW (ref 39.0–52.0)
Hemoglobin: 11.8 g/dL — ABNORMAL LOW (ref 13.0–17.0)
MCH: 27.9 pg (ref 26.0–34.0)
MCHC: 33.4 g/dL (ref 30.0–36.0)
MCV: 83.5 fL (ref 78.0–100.0)
Platelets: 372 K/uL (ref 150–400)
RBC: 4.23 MIL/uL (ref 4.22–5.81)
RDW: 13.2 % (ref 11.5–15.5)
WBC: 5.9 K/uL (ref 4.0–10.5)

## 2016-08-21 LAB — ETHANOL: Alcohol, Ethyl (B): <5 mg/dL

## 2016-08-21 LAB — RAPID URINE DRUG SCREEN, HOSP PERFORMED
AMPHETAMINES: NOT DETECTED
BARBITURATES: NOT DETECTED
BENZODIAZEPINES: NOT DETECTED
COCAINE: NOT DETECTED
Opiates: NOT DETECTED
TETRAHYDROCANNABINOL: NOT DETECTED

## 2016-08-21 LAB — SALICYLATE LEVEL: Salicylate Lvl: <4 mg/dL (ref 2.8–30.0)

## 2016-08-21 LAB — ACETAMINOPHEN LEVEL

## 2016-08-21 MED ORDER — RIVAROXABAN 20 MG PO TABS: 20.0000 mg | ORAL_TABLET | Freq: Every day | ORAL | Status: DC

## 2016-08-21 NOTE — ED Notes (Signed)
Pt arrived to unit, No s/s of distress noted. Pt brightens on approach and currently denies SI/HI or thoughts of harming himself. Pt eating a sandwich and has a soda. Pt denies pain. Pt states he wants to talk to a Psychiatrist in the am concerning him hearing voices telling him to harm himself. No there complaints at this time.

## 2016-08-21 NOTE — ED Provider Notes (Signed)
[facility]-EMERGENCY DEPT Provider Note   CSN: [id number] Arrival date & time: 08/21/16  0230     History   Chief Complaint Chief Complaint  Patient presents with  . Medical Clearance    HPI Cole Barrett is a 30 y.o. male.  Patient presents w [employer], brother calling and reporting that patient was hearing voices that 'were out to hurt him'.  In ED, patient indicates he 'just had to get up out of there, where he was at'. Patient has very little insight into earlier symptoms, and/or why family was concerned about him.  He currently denies hearing voices. Denies feeling depressed. He denies any thoughts of self harm or harm to others.  He indicates he feels fine, and is just waiting until his girlfriend gets off work this AM.     The history is provided by the patient and the police.    Past Medical History:  Diagnosis Date  . Mental disorder   . PE (pulmonary embolism)     Patient Active Problem List   Diagnosis Date Noted  . Pulmonary emboli (HCC) 07/13/2016  . Adjustment disorder with anxiety 03/12/2014  . 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 Acute left flank pain 01/02/2014  . Pulmonary embolism, bilateral (HCC) 01/02/2014    History reviewed. No pertinent surgical history.     Home Medications    Prior to Admission medications   Medication Sig Start Date End Date Taking? Authorizing Provider  Rivaroxaban (XARELTO) 15 MG TABS tablet Take 1 tablet (15 mg total) by mouth 2 (two) times daily with a meal. 07/14/16  Yes Beather Arbour, MD  rivaroxaban (XARELTO) 20 MG TABS tablet Take 1 tablet (20 mg total) by mouth daily with supper. Patient not taking: Reported on 07/15/2016 08/05/16   Beather Arbour, MD    Family History History reviewed. No pertinent family history.  Social History Social History  Substance Use Topics  . Smoking status: Current Every Day Smoker    Packs/day: 0.30    Years: 0.50    Types: Cigarettes  . Smokeless tobacco: Never  Used  . Alcohol use Yes     Comment: 1 40 oz per day     Allergies   Haldol [haloperidol]   Review of Systems Review of Systems  Constitutional: Negative for fever.  HENT: Negative for sore throat.   Eyes: Negative for redness.  Respiratory: Negative for shortness of breath.   Cardiovascular: Negative for chest pain.  Gastrointestinal: Negative for abdominal pain.  Genitourinary: Negative for flank pain.  Musculoskeletal: Negative for back pain and neck pain.  Skin: Negative for rash.  Neurological: Negative for headaches.  Hematological: Does not bruise/bleed easily.  Psychiatric/Behavioral: Negative for suicidal ideas.     Physical Exam Updated Vital Signs BP 140/92 (BP Location: Right Arm)   Pulse 106   Temp 98 F (36.7 C) (Oral)   Resp 18   SpO2 99%   Physical Exam  Constitutional: He appears well-developed and well-nourished. No distress.  HENT:  Head: Atraumatic.  Eyes: Pupils are equal, round, and reactive to light.  Neck: Neck supple. No tracheal deviation present.  Cardiovascular: Normal rate, regular rhythm, normal heart sounds and intact distal pulses.   Pulmonary/Chest: Effort normal and breath sounds normal. No accessory muscle usage. No respiratory distress.  Abdominal: Soft. He exhibits no distension. There is no tenderness.  Musculoskeletal: He exhibits no edema.  Neurological: He is alert.  Speech clear/fluent. Oriented to person, place, day. Ambulates w steady  gait.   Skin: Skin is warm and dry. He is not diaphoretic.  Psychiatric:  Odd affect. States he was just here to talk to someone, and he is okay otherwise.   Nursing note and vitals reviewed.    ED Treatments / Results  Labs (all labs ordered are listed, but only abnormal results are displayed) Results for orders placed or performed during the hospital encounter of 08/21/16  Comprehensive metabolic panel  Result Value Ref Range   Sodium 139 135 - 145 mmol/L   Potassium 4.1 3.5 -  5.1 mmol/L   Chloride 106 101 - 111 mmol/L   CO2 26 22 - 32 mmol/L   Glucose, Bld 97 65 - 99 mg/dL   BUN 14 6 - 20 mg/dL   Creatinine, Ser 1.611.11 0.61 - 1.24 mg/dL   Calcium 9.0 8.9 - 09.610.3 mg/dL   Total Protein 7.7 6.5 - 8.1 g/dL   Albumin 4.0 3.5 - 5.0 g/dL   AST 20 15 - 41 U/L   ALT 37 17 - 63 U/L   Alkaline Phosphatase 40 38 - 126 U/L   Total Bilirubin 0.5 0.3 - 1.2 mg/dL   GFR calc non Af Amer >60 >60 mL/min   GFR calc Af Amer >60 >60 mL/min   Anion gap 7 5 - 15  Ethanol  Result Value Ref Range   Alcohol, Ethyl (B) <5 <5 mg/dL  Salicylate level  Result Value Ref Range   Salicylate Lvl <4.0 2.8 - 30.0 mg/dL  Acetaminophen level  Result Value Ref Range   Acetaminophen (Tylenol), Serum <10 (L) 10 - 30 ug/mL  cbc  Result Value Ref Range   WBC 5.9 4.0 - 10.5 K/uL   RBC 4.23 4.22 - 5.81 MIL/uL   Hemoglobin 11.8 (L) 13.0 - 17.0 g/dL   HCT 04.535.3 (L) 40.939.0 - 81.152.0 %   MCV 83.5 78.0 - 100.0 fL   MCH 27.9 26.0 - 34.0 pg   MCHC 33.4 30.0 - 36.0 g/dL   RDW 91.413.2 78.211.5 - 95.615.5 %   Platelets 372 150 - 400 K/uL    EKG  EKG Interpretation None       Radiology No results found.  Procedures Procedures (including critical care time)  Medications Ordered in ED Medications - No data to display   Initial Impression / Assessment and Plan / ED Course  I have reviewed the triage vital signs and the nursing notes.  Pertinent labs & imaging results that were available during my care of the patient were reviewed by me and considered in my medical decision making (see chart for details).  Clinical Course   Labs sent.  Behavioral health team consulted.  Reviewed nursing notes and prior charts for additional history.   [facility]   Final Clinical Impressions(s) / ED Diagnoses   Final diagnoses:  None    New Prescriptions New Prescriptions   No medications on file     Cathren LaineKevin Issak Goley, MD 08/21/16 [phone]

## 2016-08-21 NOTE — ED Notes (Signed)
Pt discharged ambulatory.  Discharge instructions reviewed and all belongings were returned to patient.

## 2016-08-21 NOTE — Consult Note (Addendum)
[name]   Reason for Consult:  Anxiety and evaluation Referring Physician:  EDP Patient Identification: SYRE KNERR MRN:  [id number] Principal Diagnosis: Adjustment disorder with anxiety Diagnosis:   Patient Active Problem List   Diagnosis Date Noted  . Adjustment disorder with anxiety [F43.22] 03/12/2014    Priority: High  . Pulmonary emboli ([place]) [I26.99] 07/13/2016  . Pulmonary embolism and infarction ([place]) [I26.99] 01/02/2014  . Acute left flank pain [R10.12] 01/02/2014  . Pulmonary embolism, bilateral ([place]) [I26.99] 01/02/2014    Total Time spent with patient: 45 minutes  Subjective:   Cole Barrett is a 30 y.o. male patient does not warrant admission.  HPI:  31 yo male who came to the ED to "talk" to someone.   He reports he just moved from [place] to live with his girlfriend but was suppose to stay with his sister-in-law last night when his girlfriend work.  However, he states he not feel right staying there and his girlfriend told him to come here and be evaluated by psychiatry.  Denies suicidal/homicidal ideations, hallucinations, and alcohol/drug use.  He wants to leave when his girlfriend gets off work this am and will pick him up.    Past Psychiatric History: none  Risk to Self: Suicidal Ideation: No Suicidal Intent: No Is patient at risk for suicide?: No Suicidal Plan?: No Access to Means: No What has been your use of drugs/alcohol within the last 12 months?: Pt denies  How many times?: 0 Other Self Harm Risks: Pt denies  Triggers for Past Attempts: None known Intentional Self Injurious Behavior: None Risk to Others: Homicidal Ideation: No Thoughts of Harm to Others: No Current Homicidal Intent: No Current Homicidal Plan: No Access to Homicidal Means: No Identified Victim: N/A History of harm to others?: No Assessment of Violence: None Noted Violent Behavior Description: No violent behaviors observed. Pt is calm and  cooperative at this time.  Does patient have access to weapons?: No Criminal Charges Pending?: No Does patient have a court date: No Prior Inpatient Therapy: Prior Inpatient Therapy: Yes Prior Therapy Dates: 2017 Prior Therapy Facilty/Provider(s): [place]  Reason for Treatment: "I just needed to talk to someone"  Prior Outpatient Therapy: Prior Outpatient Therapy: No Does patient have an ACCT team?: No Does patient have Intensive In-House Services?  : No Does patient have [facility] services? : No Does patient have P4[facility] services?: No  Past Medical History:  Past Medical History:  Diagnosis Date  . Mental disorder   . PE (pulmonary embolism)    History reviewed. No pertinent surgical history. Family History: History reviewed. No pertinent family history. Family Psychiatric  History: none Social History:  History  Alcohol Use  . Yes    Comment: 1 40 oz per day     History  Drug Use  . Types: Marijuana    Social History   Social History  . Marital status: Single    Spouse name: N/A  . Number of children: N/A  . Years of education: N/A   Social History Main Topics  . Smoking status: Current Every Day Smoker    Packs/day: 0.30    Years: 0.50    Types: Cigarettes  . Smokeless tobacco: Never Used  . Alcohol use Yes     Comment: 1 40 oz per day  . Drug use:     Types: Marijuana  . Sexual activity: Not Asked   Other Topics Concern  . None   Social History Narrative  .  None   Additional Social History:    Allergies:   Allergies  Allergen Reactions  . Haldol [Haloperidol] Shortness Of Breath    Labs:  Results for orders placed or performed during the hospital encounter of 08/21/16 (from the past 48 hour(s))  Rapid urine drug screen (hospital performed)     Status: None   Collection Time: 08/21/16  2:47 AM  Result Value Ref Range   Opiates NONE DETECTED NONE DETECTED   Cocaine NONE DETECTED NONE DETECTED   Benzodiazepines NONE DETECTED NONE DETECTED    Amphetamines NONE DETECTED NONE DETECTED   Tetrahydrocannabinol NONE DETECTED NONE DETECTED   Barbiturates NONE DETECTED NONE DETECTED    Comment:        DRUG SCREEN FOR MEDICAL PURPOSES ONLY.  IF CONFIRMATION IS NEEDED FOR ANY PURPOSE, NOTIFY LAB WITHIN 5 DAYS.        LOWEST DETECTABLE LIMITS FOR URINE DRUG SCREEN Drug Class       Cutoff (ng/mL) Amphetamine      1000 Barbiturate      200 Benzodiazepine   960 Tricyclics       454 Opiates          300 Cocaine          300 THC              50   Comprehensive metabolic panel     Status: None   Collection Time: 08/21/16  2:49 AM  Result Value Ref Range   Sodium 139 135 - 145 mmol/L   Potassium 4.1 3.5 - 5.1 mmol/L   Chloride 106 101 - 111 mmol/L   CO2 26 22 - 32 mmol/L   Glucose, Bld 97 65 - 99 mg/dL   BUN 14 6 - 20 mg/dL   Creatinine, Ser 1.11 0.61 - 1.24 mg/dL   Calcium 9.0 8.9 - 10.3 mg/dL   Total Protein 7.7 6.5 - 8.1 g/dL   Albumin 4.0 3.5 - 5.0 g/dL   AST 20 15 - 41 U/L   ALT 37 17 - 63 U/L   Alkaline Phosphatase 40 38 - 126 U/L   Total Bilirubin 0.5 0.3 - 1.2 mg/dL   GFR calc non Af Amer >60 >60 mL/min   GFR calc Af Amer >60 >60 mL/min    Comment: (NOTE) The eGFR has been calculated using the CKD EPI equation. This calculation has not been validated in all clinical situations. eGFR's persistently <60 mL/min signify possible Chronic Kidney Disease.    Anion gap 7 5 - 15  Ethanol     Status: None   Collection Time: 08/21/16  2:49 AM  Result Value Ref Range   Alcohol, Ethyl (B) <5 <5 mg/dL    Comment:        LOWEST DETECTABLE LIMIT FOR SERUM ALCOHOL IS 5 mg/dL FOR MEDICAL PURPOSES ONLY   Salicylate level     Status: None   Collection Time: 08/21/16  2:49 AM  Result Value Ref Range   Salicylate Lvl <0.9 2.8 - 30.0 mg/dL  Acetaminophen level     Status: Abnormal   Collection Time: 08/21/16  2:49 AM  Result Value Ref Range   Acetaminophen (Tylenol), Serum <10 (L) 10 - 30 ug/mL    Comment:         THERAPEUTIC CONCENTRATIONS VARY SIGNIFICANTLY. A RANGE OF 10-30 ug/mL MAY BE AN EFFECTIVE CONCENTRATION FOR MANY PATIENTS. HOWEVER, SOME ARE BEST TREATED AT CONCENTRATIONS OUTSIDE THIS RANGE. ACETAMINOPHEN CONCENTRATIONS >150 ug/mL AT 4 HOURS AFTER INGESTION  AND >50 ug/mL AT 12 HOURS AFTER INGESTION ARE OFTEN ASSOCIATED WITH TOXIC REACTIONS.   cbc     Status: Abnormal   Collection Time: 08/21/16  2:49 AM  Result Value Ref Range   WBC 5.9 4.0 - 10.5 K/uL   RBC 4.23 4.22 - 5.81 MIL/uL   Hemoglobin 11.8 (L) 13.0 - 17.0 g/dL   HCT 35.3 (L) 39.0 - 52.0 %   MCV 83.5 78.0 - 100.0 fL   MCH 27.9 26.0 - 34.0 pg   MCHC 33.4 30.0 - 36.0 g/dL   RDW 13.2 11.5 - 15.5 %   Platelets 372 150 - 400 K/uL    Current Facility-Administered Medications  Medication Dose Route Frequency Provider Last Rate Last Dose  . rivaroxaban (XARELTO) tablet 20 mg  20 mg Oral Q supper Lajean Saver, MD       Current Outpatient Prescriptions  Medication Sig Dispense Refill  . rivaroxaban (XARELTO) 20 MG TABS tablet Take 20 mg by mouth daily with supper.      Musculoskeletal: Strength & Muscle Tone: within normal limits Gait & Station: normal Patient leans: N/A  Psychiatric Specialty Exam: Physical Exam  Constitutional: He is oriented to person, place, and time. He appears well-developed and well-nourished.  HENT:  Head: Normocephalic.  Neck: Normal range of motion.  Respiratory: Effort normal.  Musculoskeletal: Normal range of motion.  Neurological: He is alert and oriented to person, place, and time.  Skin: Skin is warm and dry.  Psychiatric: He has a normal mood and affect. His speech is normal and behavior is normal. Judgment and thought content normal. Cognition and memory are normal.    Review of Systems  Constitutional: Negative.   HENT: Negative.   Eyes: Negative.   Respiratory: Negative.   Cardiovascular: Negative.   Gastrointestinal: Negative.   Genitourinary: Negative.    Musculoskeletal: Negative.   Skin: Negative.   Neurological: Negative.   Endo/Heme/Allergies: Negative.   Psychiatric/Behavioral: Negative.     Blood pressure 122/57, pulse 83, temperature 98.2 F (36.8 C), temperature source Oral, resp. rate 18, SpO2 98 %.There is no height or weight on file to calculate BMI.  General Appearance: Casual  Eye Contact:  Good  Speech:  Normal Rate  Volume:  Normal  Mood:  Euthymic  Affect:  Congruent  Thought Process:  Coherent and Descriptions of Associations: Intact  Orientation:  Full (Time, Place, and Person)  Thought Content:  WDL  Suicidal Thoughts:  No  Homicidal Thoughts:  No  Memory:  Immediate;   Good Recent;   Good Remote;   Good  Judgement:  Fair  Insight:  Fair  Psychomotor Activity:  Normal  Concentration:  Concentration: Good and Attention Span: Good  Recall:  Good  Fund of Knowledge:  Fair  Language:  Good  Akathisia:  No  Handed:  Right  AIMS (if indicated):     Assets:  Housing Leisure Time Physical Health Resilience Social Support  ADL's:  Intact  Cognition:  WNL  Sleep:        Treatment Plan Summary: Daily contact with patient to assess and evaluate symptoms and progress in treatment, Medication management and Plan adjustment disorder with anxiety:  -Crisis stabilization -Medication management:  Medical medication restarted -Individual counseling -Outpatient resources provided  Disposition: No evidence of imminent risk to self or others at present.    Waylan Boga, NP 08/21/2016 11:15 AM  Patient seen face-to-face for psychiatric evaluation, chart reviewed and case discussed with the physician extender and developed treatment plan.  Reviewed the information documented and agree with the treatment plan. Corena Pilgrim, MD

## 2016-08-21 NOTE — [facility] Suicide Risk Assessment (Signed)
Suicide Risk Assessment  Discharge Assessment   [facility]   Principal Problem: Adjustment disorder with anxiety Discharge Diagnoses:  Patient Active Problem List   Diagnosis Date Noted  . Adjustment disorder with anxiety [F43.22] 03/12/2014    Priority: High  . Pulmonary emboli (HCC) [I26.99] 07/13/2016  . Pulmonary embolism and infarction (HCC) [I26.99] 01/02/2014  . Acute left flank pain [R10.12] 01/02/2014  . Pulmonary embolism, bilateral (HCC) [I26.99] 01/02/2014    Total Time spent with patient: 45 minutes   Musculoskeletal: Strength & Muscle Tone: within normal limits Gait & Station: normal Patient leans: N/A  Psychiatric Specialty Exam: Physical Exam  Constitutional: He is oriented to person, place, and time. He appears well-developed and well-nourished.  HENT:  Head: Normocephalic.  Neck: Normal range of motion.  Respiratory: Effort normal.  Musculoskeletal: Normal range of motion.  Neurological: He is alert and oriented to person, place, and time.  Skin: Skin is warm and dry.  Psychiatric: He has a normal mood and affect. His speech is normal and behavior is normal. Judgment and thought content normal. Cognition and memory are normal.    Review of Systems  Constitutional: Negative.   HENT: Negative.   Eyes: Negative.   Respiratory: Negative.   Cardiovascular: Negative.   Gastrointestinal: Negative.   Genitourinary: Negative.   Musculoskeletal: Negative.   Skin: Negative.   Neurological: Negative.   Endo/Heme/Allergies: Negative.   Psychiatric/Behavioral: Negative.     Blood pressure 122/57, pulse 83, temperature 98.2 F (36.8 C), temperature source Oral, resp. rate 18, SpO2 98 %.There is no height or weight on file to calculate BMI.  General Appearance: Casual  Eye Contact:  Good  Speech:  Normal Rate  Volume:  Normal  Mood:  Euthymic  Affect:  Congruent  Thought Process:  Coherent and Descriptions of Associations: Intact   Orientation:  Full (Time, Place, and Person)  Thought Content:  WDL  Suicidal Thoughts:  No  Homicidal Thoughts:  No  Memory:  Immediate;   Good Recent;   Good Remote;   Good  Judgement:  Fair  Insight:  Fair  Psychomotor Activity:  Normal  Concentration:  Concentration: Good and Attention Span: Good  Recall:  Good  Fund of Knowledge:  Fair  Language:  Good  Akathisia:  No  Handed:  Right  AIMS (if indicated):     Assets:  Housing Leisure Time Physical Health Resilience Social Support  ADL's:  Intact  Cognition:  WNL  Sleep:       Mental Status Per Nursing Assessment::   On Admission:   anxiety   Demographic Factors:  Male and Adolescent or young adult  Loss Factors: NA  Historical Factors: NA  Risk Reduction Factors:   Sense of responsibility to family, Living with another person, especially a relative and Positive social support  Continued Clinical Symptoms:  None  Cognitive Features That Contribute To Risk:  None    Suicide Risk:  Minimal: No identifiable suicidal ideation.  Patients presenting with no risk factors but with morbid ruminations; may be classified as minimal risk based on the severity of the depressive symptoms    Plan Of Care/Follow-up recommendations:  Activity:  as tolerated Diet:  heart healthy diet  LORD, JAMISON, NP 08/21/2016, 11:20 AM

## 2016-08-21 NOTE — BH Assessment (Signed)
Tele Assessment Note   Cole Barrett is an 30 y.o. male presenting to [facility] unaccompanied. PT stated "I didn't feel like staying at my sister in law house". "I just didn't feel comfortable staying there". PT reported that law enforcement brought him to the hospital so he could talk to someone. Pt also reported that he is waiting for his girlfriend to get off of work. Pt denies feeling like someone is out to get him but stated "they may be jealous of me". When pt asked who "they" were he stated "I don't know, just people".  Pt denies SI, HI and AVH at this time. Pt did not report any previous suicide attempts or self-injurious behaviors. Pt did not report any current mental health treatment but shared that he was hospitalized several months ago in [place] because "they wanted me to talk to somebody". No psychiatric medication reported. No alcohol or illicit substance reported. No physical, sexual or emotional abuse reported.   AM Psych eval for final disposition.   Diagnosis: Unspecified mental disorder   Past Medical History:  Past Medical History:  Diagnosis Date  . Mental disorder   . PE (pulmonary embolism)     History reviewed. No pertinent surgical history.  Family History: History reviewed. No pertinent family history.  Social History:  reports that he has been smoking Cigarettes.  He has a 0.15 pack-year smoking history. He has never used smokeless tobacco. He reports that he drinks alcohol. He reports that he uses drugs, including Marijuana.  Additional Social History:  Alcohol / Drug Use History of alcohol / drug use?: No history of alcohol / drug abuse  CIWA: CIWA-Ar BP: 140/92 Pulse Rate: 106 COWS:    PATIENT STRENGTHS: (choose at least two) Average or above average intelligence Supportive family/friends  Allergies:  Allergies  Allergen Reactions  . Haldol [Haloperidol] Shortness Of Breath    Home Medications:  (Not in a hospital admission)  OB/GYN Status:  No  LMP for male patient.  General Assessment Data Location of Assessment: [facility] ED TTS Assessment: In system Is this a Tele or Face-to-Face Assessment?: Face-to-Face Is this an Initial Assessment or a Re-assessment for this encounter?: Initial Assessment Marital status: Single Living Arrangements: Spouse/significant other Can pt return to current living arrangement?: Yes Admission Status: Voluntary Is patient capable of signing voluntary admission?: Yes Referral Source: Self/Family/Friend Insurance type: Medicaid      Crisis Care Plan Living Arrangements: Spouse/significant other Name of Psychiatrist: No provider reported.  Name of Therapist: No provider reported.   Education Status Is patient currently in school?: No Highest grade of school patient has completed: 12  Risk to self with the past 6 months Suicidal Ideation: No Has patient been a risk to self within the past 6 months prior to admission? : No Suicidal Intent: No Has patient had any suicidal intent within the past 6 months prior to admission? : No Is patient at risk for suicide?: No Suicidal Plan?: No Has patient had any suicidal plan within the past 6 months prior to admission? : No Access to Means: No What has been your use of drugs/alcohol within the last 12 months?: Pt denies  Previous Attempts/Gestures: No How many times?: 0 Other Self Harm Risks: Pt denies  Triggers for Past Attempts: None known Intentional Self Injurious Behavior: None Family Suicide History: No Recent stressful life event(s):  (None reported. ) Persecutory voices/beliefs?: No Depression: No Depression Symptoms:  (Pt denies ) Substance abuse history and/or treatment for substance abuse?: No  Suicide prevention information given to non-admitted patients: Not applicable  Risk to Others within the past 6 months Homicidal Ideation: No Does patient have any lifetime risk of violence toward others beyond the six months prior to admission? :  No Thoughts of Harm to Others: No Current Homicidal Intent: No Current Homicidal Plan: No Access to Homicidal Means: No Identified Victim: N/A History of harm to others?: No Assessment of Violence: None Noted Violent Behavior Description: No violent behaviors observed. Pt is calm and cooperative at this time.  Does patient have access to weapons?: No Criminal Charges Pending?: No Does patient have a court date: No Is patient on probation?: No  Psychosis Hallucinations: None noted Delusions: None noted  Mental Status Report Appearance/Hygiene: Unremarkable Eye Contact: Good Motor Activity: Freedom of movement Speech: Logical/coherent Level of Consciousness: Quiet/awake Mood: Pleasant Affect: Appropriate to circumstance Anxiety Level: None Thought Processes: Coherent, Relevant Judgement: Unimpaired Orientation: Person, Place, Time, Situation Obsessive Compulsive Thoughts/Behaviors: None  Cognitive Functioning Concentration: Normal Memory: Recent Intact, Remote Intact IQ: Average Insight: Fair Impulse Control: Good Appetite: Good Weight Loss: 0 Weight Gain: 0 Sleep: No Change Total Hours of Sleep: 8 Vegetative Symptoms: None  ADLScreening [facility]) Patient's cognitive ability adequate to safely complete daily activities?: Yes Patient able to express need for assistance with ADLs?: Yes Independently performs ADLs?: Yes (appropriate for developmental age)  Prior Inpatient Therapy Prior Inpatient Therapy: Yes Prior Therapy Dates: 2017 Prior Therapy Facilty/Provider(s): [place]  Reason for Treatment: "I just needed to talk to someone"   Prior Outpatient Therapy Prior Outpatient Therapy: No Does patient have an ACCT team?: No Does patient have Intensive In-House Services?  : No Does patient have [facility] services? : No Does patient have P4CC services?: No  ADL Screening (condition at time of admission) Patient's cognitive ability adequate to  safely complete daily activities?: Yes Is the patient deaf or have difficulty hearing?: No Does the patient have difficulty seeing, even when wearing glasses/contacts?: No Does the patient have difficulty concentrating, remembering, or making decisions?: No Patient able to express need for assistance with ADLs?: Yes Does the patient have difficulty dressing or bathing?: No Independently performs ADLs?: Yes (appropriate for developmental age) Does the patient have difficulty walking or climbing stairs?: No       Abuse/Neglect Assessment (Assessment to be complete while patient is alone) Physical Abuse: Denies Verbal Abuse: Denies Sexual Abuse: Denies Exploitation of patient/patient's resources: Denies Self-Neglect: Denies     Merchant navy officerAdvance Directives (For Healthcare) Does patient have an advance directive?: No Would patient like information on creating an advanced directive?: No - patient declined information    Additional Information 1:1 In Past 12 Months?: No CIRT Risk: No Elopement Risk: No Does patient have medical clearance?: Yes     Disposition:  Disposition Initial Assessment Completed for this Encounter: Yes Disposition of Patient: Other dispositions Other disposition(s): Other (Comment) (AM Psych eval )  Elinor Kleine S 08/21/2016 4:39 AM

## 2016-08-21 NOTE — ED Triage Notes (Signed)
Patient brought in by [employer] with complaints of "just wanting to talk to someone". [employer] states that were called by patients brother stating that he was hearing voices that were out to hurt him. Patient denies SI/HI.

## 2016-08-21 NOTE — BH Assessment (Signed)
Assessment completed. Consulted Maryjean Mornharles Kober, PA-C who recommended that pt be evaluated in the morning.

## 2016-08-21 NOTE — ED Notes (Signed)
Bed: WTR5 Expected date:  Expected time:  Means of arrival:  Comments: 

## 2016-08-22 ENCOUNTER — Emergency Department (HOSPITAL_COMMUNITY)
Admission: EM | Admit: 2016-08-22 | Discharge: 2016-08-22 | Disposition: A | Discharge status: Home / Self Care | Payer: Medicaid Other | Source: Home / Self Care | Attending: Emergency Medicine | Admitting: Emergency Medicine

## 2016-08-22 ENCOUNTER — Emergency Department (HOSPITAL_COMMUNITY): Payer: Medicaid Other

## 2016-08-22 ENCOUNTER — Encounter (HOSPITAL_COMMUNITY): Payer: Self-pay | Admitting: *Deleted

## 2016-08-22 DIAGNOSIS — S60031A Contusion of right middle finger without damage to nail, initial encounter: Secondary | ICD-10-CM | POA: Insufficient documentation

## 2016-08-22 DIAGNOSIS — F17210 Nicotine dependence, cigarettes, uncomplicated: Secondary | ICD-10-CM

## 2016-08-22 DIAGNOSIS — Z86711 Personal history of pulmonary embolism: Secondary | ICD-10-CM | POA: Insufficient documentation

## 2016-08-22 DIAGNOSIS — Y939 Activity, unspecified: Secondary | ICD-10-CM | POA: Insufficient documentation

## 2016-08-22 DIAGNOSIS — Y999 Unspecified external cause status: Secondary | ICD-10-CM | POA: Diagnosis not present

## 2016-08-22 DIAGNOSIS — Y929 Unspecified place or not applicable: Secondary | ICD-10-CM | POA: Insufficient documentation

## 2016-08-22 DIAGNOSIS — Z7901 Long term (current) use of anticoagulants: Secondary | ICD-10-CM | POA: Insufficient documentation

## 2016-08-22 DIAGNOSIS — W228XXA Striking against or struck by other objects, initial encounter: Secondary | ICD-10-CM | POA: Insufficient documentation

## 2016-08-22 DIAGNOSIS — R0789 Other chest pain: Secondary | ICD-10-CM | POA: Insufficient documentation

## 2016-08-22 DIAGNOSIS — S6991XA Unspecified injury of right wrist, hand and finger(s), initial encounter: Secondary | ICD-10-CM | POA: Diagnosis present

## 2016-08-22 NOTE — ED Notes (Signed)
Pt departed in NAD, refused use of wheelchair.  

## 2016-08-22 NOTE — ED Notes (Signed)
Patient transported to X-ray 

## 2016-08-22 NOTE — ED Triage Notes (Signed)
Patient presents with c/o left torso pain and states "I need some xrays"

## 2016-08-22 NOTE — ED Provider Notes (Signed)
[facility]-EMERGENCY DEPT Provider Note   CSN: [id number] Arrival date & time: 08/22/16  0111   By signing my name below, I, [name], attest that this documentation has been prepared under the direction and in the presence of [name], MD  Electronically Signed: [name], ED Scribe. 08/22/16. 2:06 AM.   History   Chief Complaint Chief Complaint  Patient presents with  . Left side pain (torso)    The history is provided by the patient. No language interpreter was used.   HPI Comments:  Cole Barrett is a 30 y.o. male who presents to the Emergency Department complaining of 7/10 sharp left sided chest pain onset 1 day ago. Pt notes pain is exacerbated to the touch and mildly when twisting. Pt denies any recent injury.  He is on a blood thinner. No alleviating factors noted. Pt denies any other complaints at this time.   Past Medical History:  Diagnosis Date  . Mental disorder   . PE (pulmonary embolism)     Patient Active Problem List   Diagnosis Date Noted  . Pulmonary emboli (HCC) 07/13/2016  . Adjustment disorder with anxiety 03/12/2014  . 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 Acute left flank pain 01/02/2014  . Pulmonary embolism, bilateral (HCC) 01/02/2014    History reviewed. No pertinent surgical history.     Home Medications    Prior to Admission medications   Medication Sig Start Date End Date Taking? Authorizing Provider  rivaroxaban (XARELTO) 20 MG TABS tablet Take 20 mg by mouth daily with supper.    Historical Provider, MD    Family History No family history on file.  Social History Social History  Substance Use Topics  . Smoking status: Current Every Day Smoker    Packs/day: 0.30    Years: 0.50    Types: Cigarettes  . Smokeless tobacco: Never Used  . Alcohol use Yes     Comment: 1 40 oz per day     Allergies   Haldol [haloperidol]   Review of Systems Review of Systems  Cardiovascular: Positive for chest  pain.  All other systems reviewed and are negative.    Physical Exam Updated Vital Signs BP 112/70 (BP Location: Left Arm)   Pulse 94   Temp 98.1 F (36.7 C) (Oral)   Resp 16   SpO2 100%   Physical Exam  Constitutional: He is oriented to person, place, and time. He appears well-developed and well-nourished.  HENT:  Head: Normocephalic and atraumatic.  Eyes: EOM are normal. Pupils are equal, round, and reactive to light.  Neck: Normal range of motion. Neck supple. No JVD present.  Cardiovascular: Normal rate, regular rhythm and normal heart sounds.   No murmur heard. Mild tenderness of left anterior chest wall  Pulmonary/Chest: Effort normal and breath sounds normal. He has no wheezes. He has no rales. He exhibits tenderness.  Abdominal: Soft. Bowel sounds are normal. He exhibits no distension and no mass. There is no tenderness.  Musculoskeletal: Normal range of motion. He exhibits no edema.  Lymphadenopathy:    He has no cervical adenopathy.  Neurological: He is alert and oriented to person, place, and time. No cranial nerve deficit. He exhibits normal muscle tone. Coordination normal.  Skin: Skin is warm and dry. No rash noted.  Psychiatric: He has a normal mood and affect. His behavior is normal. Judgment and thought content normal.  Nursing note and vitals reviewed.    ED Treatments / Results  DIAGNOSTIC STUDIES:  Oxygen Saturation is 97% on RA, normal by my interpretation.    COORDINATION OF CARE:  2:02 AM Discussed treatment plan with pt at bedside and pt agreed to plan.  Radiology Dg Chest 2 View  Result Date: 08/22/2016 CLINICAL DATA:  LEFT-sided chest pain tonight. History of pulmonary embolism and infarction. EXAM: CHEST  2 VIEW COMPARISON:  Chest radiograph July 15, 2016 and CT chest July 13, 2016 FINDINGS: Increasing focal consolidation LEFT lower lobe. Cardiac silhouette is upper limits of normal in size, mediastinal silhouette is nonsuspicious. No  pleural effusion. No pneumothorax. Soft tissue planes and included osseous structures are normal. IMPRESSION: Increasing consolidation LEFT lower lobe suspicious for pneumonia, less likely pulmonary infarct given patient's history of pulmonary embolism. Electronically Signed   By: Awilda Metroourtnay  Bloomer M.D.   On: 08/22/2016 03:11    Procedures Procedures (including critical care time)  Medications Ordered in ED Medications - No data to display   Initial Impression / Assessment and Plan / ED Course  I have reviewed the triage vital signs and the nursing notes.  Pertinent labs & imaging results that were available during my care of the patient were reviewed by me and considered in my medical decision making (see chart for details).  Clinical Course   Chest pain with tenderness consistent with chest wall pain. Old records are reviewed, and he has been treated recently for pulmonary embolism and is currently taking rivaroxaban. He is sent for chest x-ray which shows some consolidation in the left base which most likely represents a pulmonary infarction. However, his pain seems to be clearly located in the chest wall. He is given reassurance and advised to use local measures such as ice as well as acetaminophen for pain.  Final Clinical Impressions(s) / ED Diagnoses   Final diagnoses:  Chest wall pain  History of pulmonary embolism    New Prescriptions New Prescriptions   No medications on file  I personally performed the services described in this documentation, which was scribed in my presence. The recorded information has been reviewed and is accurate.       Dione Boozeavid Lelynd Poer, MD 08/22/16 [phone]

## 2016-08-22 NOTE — ED Notes (Signed)
Patient here last night but states "thatr was for something else"  Patient with flat affect

## 2016-08-22 NOTE — Discharge Instructions (Signed)
Take acetaminophen as needed for pain. Do not take ibuprofen or naproxen - when taking a blood thinner, they can cause bleeding in your stomach.

## 2016-08-23 ENCOUNTER — Encounter (HOSPITAL_COMMUNITY): Payer: Self-pay | Admitting: Emergency Medicine

## 2016-08-23 ENCOUNTER — Emergency Department (HOSPITAL_COMMUNITY)
Admission: EM | Admit: 2016-08-23 | Discharge: 2016-08-23 | Disposition: A | Discharge status: Home / Self Care | Payer: Medicaid Other | Attending: Emergency Medicine | Admitting: Emergency Medicine

## 2016-08-23 ENCOUNTER — Emergency Department (HOSPITAL_COMMUNITY): Payer: Medicaid Other

## 2016-08-23 DIAGNOSIS — F22 Delusional disorders: Secondary | ICD-10-CM | POA: Insufficient documentation

## 2016-08-23 DIAGNOSIS — F1290 Cannabis use, unspecified, uncomplicated: Secondary | ICD-10-CM | POA: Diagnosis not present

## 2016-08-23 DIAGNOSIS — S6000XA Contusion of unspecified finger without damage to nail, initial encounter: Secondary | ICD-10-CM

## 2016-08-23 DIAGNOSIS — F17210 Nicotine dependence, cigarettes, uncomplicated: Secondary | ICD-10-CM | POA: Diagnosis not present

## 2016-08-23 DIAGNOSIS — F4322 Adjustment disorder with anxiety: Secondary | ICD-10-CM | POA: Insufficient documentation

## 2016-08-23 DIAGNOSIS — Z79899 Other long term (current) drug therapy: Secondary | ICD-10-CM | POA: Diagnosis not present

## 2016-08-23 MED ORDER — HYDROCODONE-ACETAMINOPHEN 5-325 MG PO TABS
1.0000 | ORAL_TABLET | Freq: Once | ORAL | Status: AC
  Administered 2016-08-23: 1 via ORAL
  Filled 2016-08-23: qty 1

## 2016-08-23 NOTE — ED Triage Notes (Signed)
Pt. reports injury to right middle finger this evening , pt. stated he accidentally hit at against cabinet door , mild swelling /skin intact .

## 2016-08-23 NOTE — ED Provider Notes (Signed)
[facility]-EMERGENCY DEPT Provider Note   CSN: [id number] Arrival date & time: 08/22/16  2322     History   Chief Complaint Chief Complaint  Patient presents with  . Finger Injury    HPI Cole Barrett is a 30 y.o. male.  Patient states he smashed his right middle in a cabinet door earlier this evening.  He has pain in the middle of the finger, primarily on the palmer aspect.  No obvious bruising noted.    Hand Pain  This is a new problem. The current episode started 1 to 2 hours ago. The problem has not changed since onset.The symptoms are aggravated by bending. He has tried nothing for the symptoms.    Past Medical History:  Diagnosis Date  . Mental disorder   . PE (pulmonary embolism)     Patient Active Problem List   Diagnosis Date Noted  . Pulmonary emboli (HCC) 07/13/2016  . Adjustment disorder with anxiety 03/12/2014  . 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 Acute left flank pain 01/02/2014  . Pulmonary embolism, bilateral (HCC) 01/02/2014    History reviewed. No pertinent surgical history.     Home Medications    Prior to Admission medications   Medication Sig Start Date End Date Taking? Authorizing Provider  pravastatin (PRAVACHOL) 20 MG tablet Take 20 mg by mouth daily.    Historical Provider, MD  risperiDONE (RISPERDAL) 3 MG tablet Take 3 mg by mouth at bedtime.    Historical Provider, MD  Rivaroxaban (XARELTO) 15 MG TABS tablet Take 15 mg by mouth 2 (two) times daily with a meal.    Historical Provider, MD    Family History No family history on file.  Social History Social History  Substance Use Topics  . Smoking status: Current Every Day Smoker    Packs/day: 0.30    Years: 0.50    Types: Cigarettes  . Smokeless tobacco: Never Used  . Alcohol use Yes     Comment: 1 40 oz per day     Allergies   Haldol [haloperidol]   Review of Systems Review of Systems  Musculoskeletal: Positive for arthralgias.  All other  systems reviewed and are negative.    Physical Exam Updated Vital Signs BP 118/74 (BP Location: Right Arm)   Pulse 92   Temp 98.4 F (36.9 C) (Oral)   Resp 16   Ht 5\' 10"  (1.778 m)   Wt 100.7 kg   SpO2 99%   BMI 31.85 kg/m   Physical Exam  Constitutional: He is oriented to person, place, and time. He appears well-developed and well-nourished.  HENT:  Head: Normocephalic.  Eyes: Pupils are equal, round, and reactive to light.  Neck: Normal range of motion.  Cardiovascular: Normal rate and regular rhythm.   Pulmonary/Chest: Effort normal and breath sounds normal.  Abdominal: Soft. Bowel sounds are normal.  Musculoskeletal: He exhibits tenderness. He exhibits no edema.       Hands: Neurological: He is alert and oriented to person, place, and time.  Skin: Skin is warm and dry.  Psychiatric: He has a normal mood and affect.  Nursing note and vitals reviewed.    ED Treatments / Results  Labs (all labs ordered are listed, but only abnormal results are displayed) Labs Reviewed - No data to display  EKG  EKG Interpretation None       Radiology Dg Chest 2 View  Result Date: 08/22/2016 CLINICAL DATA:  LEFT-sided chest pain tonight. History of pulmonary embolism and infarction.  EXAM: CHEST  2 VIEW COMPARISON:  Chest radiograph July 15, 2016 and CT chest July 13, 2016 FINDINGS: Increasing focal consolidation LEFT lower lobe. Cardiac silhouette is upper limits of normal in size, mediastinal silhouette is nonsuspicious. No pleural effusion. No pneumothorax. Soft tissue planes and included osseous structures are normal. IMPRESSION: Increasing consolidation LEFT lower lobe suspicious for pneumonia, less likely pulmonary infarct given patient's history of pulmonary embolism. Electronically Signed   By: Awilda Metroourtnay  Bloomer M.D.   On: 08/22/2016 03:11    Procedures Procedures (including critical care time)  Medications Ordered in ED Medications  HYDROcodone-acetaminophen  (NORCO/VICODIN) 5-325 MG per tablet 1 tablet (not administered)     Initial Impression / Assessment and Plan / ED Course  I have reviewed the triage vital signs and the nursing notes.  Pertinent labs & imaging results that were available during my care of the patient were reviewed by me and considered in my medical decision making (see chart for details).  Clinical Course  Patient X-Ray negative for obvious fracture or dislocation. Patient given finger splint while in ED, conservative therapy recommended and discussed. Patient will be discharged home & is agreeable with above plan. Returns precautions discussed. Pt appears safe for discharge.    Final Clinical Impressions(s) / ED Diagnoses   Final diagnoses:  Finger contusion, initial encounter    New Prescriptions New Prescriptions   No medications on file     Felicie Mornavid Peterson Mathey, NP 08/23/16 [phone]    Dione Boozeavid Glick, MD 08/23/16 [phone]

## 2016-08-24 ENCOUNTER — Encounter (HOSPITAL_COMMUNITY): Payer: Self-pay | Admitting: *Deleted

## 2016-08-24 ENCOUNTER — Emergency Department (HOSPITAL_COMMUNITY)
Admission: EM | Admit: 2016-08-24 | Discharge: 2016-08-24 | Disposition: A | Discharge status: Home / Self Care | Payer: Medicaid Other | Attending: Emergency Medicine | Admitting: Emergency Medicine

## 2016-08-24 DIAGNOSIS — Z7901 Long term (current) use of anticoagulants: Secondary | ICD-10-CM | POA: Insufficient documentation

## 2016-08-24 DIAGNOSIS — F17210 Nicotine dependence, cigarettes, uncomplicated: Secondary | ICD-10-CM | POA: Diagnosis not present

## 2016-08-24 DIAGNOSIS — R091 Pleurisy: Secondary | ICD-10-CM | POA: Diagnosis not present

## 2016-08-24 DIAGNOSIS — R440 Auditory hallucinations: Secondary | ICD-10-CM | POA: Insufficient documentation

## 2016-08-24 DIAGNOSIS — F22 Delusional disorders: Secondary | ICD-10-CM

## 2016-08-24 DIAGNOSIS — F4322 Adjustment disorder with anxiety: Secondary | ICD-10-CM

## 2016-08-24 HISTORY — DX: Pure hypercholesterolemia, unspecified: E78.00

## 2016-08-24 LAB — COMPREHENSIVE METABOLIC PANEL
ALT: 29 U/L (ref 17–63)
AST: 19 U/L (ref 15–41)
Albumin: 3.8 g/dL (ref 3.5–5.0)
Alkaline Phosphatase: 42 U/L (ref 38–126)
Anion gap: 10 (ref 5–15)
BILIRUBIN TOTAL: 0.7 mg/dL (ref 0.3–1.2)
BUN: 11 mg/dL (ref 6–20)
CHLORIDE: 108 mmol/L (ref 101–111)
CO2: 23 mmol/L (ref 22–32)
CREATININE: 1.17 mg/dL (ref 0.61–1.24)
Calcium: 8.6 mg/dL — ABNORMAL LOW (ref 8.9–10.3)
Glucose, Bld: 115 mg/dL — ABNORMAL HIGH (ref 65–99)
POTASSIUM: 3.6 mmol/L (ref 3.5–5.1)
Sodium: 141 mmol/L (ref 135–145)
TOTAL PROTEIN: 7.4 g/dL (ref 6.5–8.1)

## 2016-08-24 LAB — RAPID URINE DRUG SCREEN, HOSP PERFORMED
AMPHETAMINES: NOT DETECTED
Barbiturates: NOT DETECTED
Benzodiazepines: NOT DETECTED
Cocaine: NOT DETECTED
OPIATES: POSITIVE — AB
Tetrahydrocannabinol: NOT DETECTED

## 2016-08-24 LAB — CBC
HCT: 34.1 % — ABNORMAL LOW (ref 39.0–52.0)
Hemoglobin: 11.5 g/dL — ABNORMAL LOW (ref 13.0–17.0)
MCH: 27.6 pg (ref 26.0–34.0)
MCHC: 33.7 g/dL (ref 30.0–36.0)
MCV: 82 fL (ref 78.0–100.0)
PLATELETS: 379 10*3/uL (ref 150–400)
RBC: 4.16 MIL/uL — AB (ref 4.22–5.81)
RDW: 13.1 % (ref 11.5–15.5)
WBC: 6.3 10*3/uL (ref 4.0–10.5)

## 2016-08-24 LAB — ETHANOL: ALCOHOL ETHYL (B): 62 mg/dL — AB (ref <5)

## 2016-08-24 LAB — ACETAMINOPHEN LEVEL: Acetaminophen (Tylenol), Serum: <10 ug/mL — ABNORMAL LOW (ref 10–30)

## 2016-08-24 LAB — SALICYLATE LEVEL

## 2016-08-24 MED ORDER — ZOLPIDEM TARTRATE 5 MG PO TABS: 5.0000 mg | ORAL_TABLET | Freq: Every evening | ORAL | Status: DC | PRN

## 2016-08-24 MED ORDER — ONDANSETRON HCL 4 MG PO TABS: 4.0000 mg | ORAL_TABLET | Freq: Three times a day (TID) | ORAL | Status: DC | PRN

## 2016-08-24 MED ORDER — ACETAMINOPHEN 325 MG PO TABS: 650.0000 mg | ORAL_TABLET | ORAL | Status: DC | PRN

## 2016-08-24 MED ORDER — RIVAROXABAN 15 MG PO TABS: 15.0000 mg | ORAL_TABLET | Freq: Two times a day (BID) | ORAL | Status: DC

## 2016-08-24 MED ORDER — RIVAROXABAN 15 MG PO TABS
15.0000 mg | ORAL_TABLET | Freq: Two times a day (BID) | ORAL | Status: DC
  Administered 2016-08-24: 15 mg via ORAL
  Filled 2016-08-24 (×3): qty 1

## 2016-08-24 MED ORDER — PRAVASTATIN SODIUM 20 MG PO TABS
20.0000 mg | ORAL_TABLET | Freq: Every day | ORAL | Status: DC
  Filled 2016-08-24: qty 1

## 2016-08-24 MED ORDER — LORAZEPAM 1 MG PO TABS
1.0000 mg | ORAL_TABLET | Freq: Three times a day (TID) | ORAL | Status: DC | PRN
  Administered 2016-08-24: 1 mg via ORAL
  Filled 2016-08-24: qty 1

## 2016-08-24 MED ORDER — NICOTINE 7 MG/24HR TD PT24: 7.0000 mg | MEDICATED_PATCH | Freq: Every day | TRANSDERMAL | Status: DC

## 2016-08-24 MED ORDER — ALUM & MAG HYDROXIDE-SIMETH 200-200-20 MG/5ML PO SUSP: 30.0000 mL | ORAL | Status: DC | PRN

## 2016-08-24 MED ORDER — RISPERIDONE 2 MG PO TABS: 3.0000 mg | ORAL_TABLET | Freq: Every day | ORAL | Status: DC

## 2016-08-24 MED ORDER — IBUPROFEN 200 MG PO TABS: 600.0000 mg | ORAL_TABLET | Freq: Three times a day (TID) | ORAL | Status: DC | PRN

## 2016-08-24 NOTE — ED Notes (Signed)
Bed: WTR8 Expected date:  Expected time:  Means of arrival:  Comments: 

## 2016-08-24 NOTE — ED Notes (Signed)
D: Patient verbalizes readiness for discharge: Denies SI/HI, is not psychotic or delusional.   A: Discharge instructions read and discussed with parents and patient. All belongings returned to pt.   R: Parent and pt verbalize understanding of discharge instructions. Signed for return of belongings.   A: Escorted to the lobby.    

## 2016-08-24 NOTE — BH Assessment (Addendum)
Assessment Note  Cole ChessChristopher A Bilger is an 30 y.o. male presenting to [facility] voluntarily for an evaluation "to make sure some things in my head is ok,:" Patient states that he went to his sisters house and he "I thought that someone was going to kill me or something and I don't know why I was thinking that but I thought that and I heard a voice saying something was going to kill me or something and I can't go back over there.' Patient states that he is not currently hearing the voice and states that he did not hear the voice before he went to the house. Patient states "I ain't going over there no more." Patient denies SI and history of attempts. Patient denies self injurious behaviors.  Patient denies HI history of aggression. Patient denies access to firearms. Patient denies current AVH and does not appear to be responding to internal stimuli during his assessment. Patient denies use of drugs and alcohol. Patient UDS + opiates. Patient BAL 62 at time of assessment.   Patient alert and oriented x4. Patient is calm and cooperative. Patient denies previous inpatient or outpatient psychiatric treatment. Patient makes good eye contact.  Patient denies history of trauma/abuse.   Consulted with Donell SievertSpencer Simon, PA-C who recommends patient be discharged with out patient resources.   Diagnosis: Adjustment disorder with anxiety  Past Medical History:  Past Medical History:  Diagnosis Date  . Hypercholesterolemia   . Mental disorder   . PE (pulmonary embolism)     History reviewed. No pertinent surgical history.  Family History: No family history on file.  Social History:  reports that he has been smoking Cigarettes.  He has a 0.15 pack-year smoking history. He has never used smokeless tobacco. He reports that he drinks alcohol. He reports that he uses drugs, including Marijuana.  Additional Social History:  Alcohol / Drug Use Pain Medications: Denies Prescriptions: Denies Over the Counter:  Denies History of alcohol / drug use?: No history of alcohol / drug abuse  CIWA: CIWA-Ar BP: 116/74 Pulse Rate: 86 COWS:    Allergies:  Allergies  Allergen Reactions  . Haldol [Haloperidol] Shortness Of Breath    Home Medications:  (Not in a hospital admission)  OB/GYN Status:  No LMP for male patient.  General Assessment Data Location of Assessment: [facility] TTS Assessment: In system Is this a Tele or Face-to-Face Assessment?: Face-to-Face Is this an Initial Assessment or a Re-assessment for this encounter?: Initial Assessment Marital status: Single Is patient pregnant?: No Pregnancy Status: No Living Arrangements: Alone Can pt return to current living arrangement?: Yes Admission Status: Voluntary Is patient capable of signing voluntary admission?: Yes Referral Source: Self/Family/Friend     Crisis Care Plan Living Arrangements: Alone Name of Psychiatrist: None Name of Therapist: None  Education Status Is patient currently in school?: No Highest grade of school patient has completed: 12  Risk to self with the past 6 months Suicidal Ideation: No Has patient been a risk to self within the past 6 months prior to admission? : No Suicidal Intent: No Has patient had any suicidal intent within the past 6 months prior to admission? : No Is patient at risk for suicide?: No Suicidal Plan?: No Has patient had any suicidal plan within the past 6 months prior to admission? : No Access to Means: No What has been your use of drugs/alcohol within the last 12 months?: Denies Previous Attempts/Gestures: No How many times?: 0 Other Self Harm Risks: Denies Triggers for Past  Attempts: None known Intentional Self Injurious Behavior: None Family Suicide History: No Recent stressful life event(s): Other (Comment) (Denies) Persecutory voices/beliefs?: No Depression:  (denies) Depression Symptoms: Feeling worthless/self pity Substance abuse history and/or treatment for substance  abuse?: No Suicide prevention information given to non-admitted patients: Not applicable  Risk to Others within the past 6 months Homicidal Ideation: No Does patient have any lifetime risk of violence toward others beyond the six months prior to admission? : No Thoughts of Harm to Others: No Current Homicidal Intent: No Current Homicidal Plan: No Access to Homicidal Means: No Identified Victim: Denies History of harm to others?: No Assessment of Violence: None Noted Violent Behavior Description: Denies Does patient have access to weapons?: No Criminal Charges Pending?: No Does patient have a court date: No Is patient on probation?: No  Psychosis Hallucinations: None noted Delusions: None noted  Mental Status Report Appearance/Hygiene: In scrubs Eye Contact: Good Motor Activity: Unable to assess Speech: Logical/coherent Level of Consciousness: Alert Mood: Pleasant Affect: Appropriate to circumstance Anxiety Level: None Thought Processes: Coherent, Relevant Judgement: Unimpaired Orientation: Person, Place, Time, Situation, Appropriate for developmental age Obsessive Compulsive Thoughts/Behaviors: None  Cognitive Functioning Concentration: Normal Memory: Recent Intact, Remote Intact IQ: Average Insight: Fair Impulse Control: Fair Appetite: Good Sleep: No Change Total Hours of Sleep: 8 Vegetative Symptoms: None  ADLScreening [facility] Assessment Services) Patient's cognitive ability adequate to safely complete daily activities?: Yes Patient able to express need for assistance with ADLs?: Yes Independently performs ADLs?: Yes (appropriate for developmental age)  Prior Inpatient Therapy Prior Inpatient Therapy: No Prior Therapy Dates: N/A Prior Therapy Facilty/Provider(s): N/A Reason for Treatment: N/A  Prior Outpatient Therapy Prior Outpatient Therapy: No Prior Therapy Dates: N/A Prior Therapy Facilty/Provider(s): N/A Reason for Treatment: N/A Does patient have  an ACCT team?: No Does patient have Intensive In-House Services?  : No Does patient have [facility] services? : No Does patient have [facility] services?: No  ADL Screening (condition at time of admission) Patient's cognitive ability adequate to safely complete daily activities?: Yes Is the patient deaf or have difficulty hearing?: No Does the patient have difficulty seeing, even when wearing glasses/contacts?: No Does the patient have difficulty concentrating, remembering, or making decisions?: No Patient able to express need for assistance with ADLs?: Yes Does the patient have difficulty dressing or bathing?: No Independently performs ADLs?: Yes (appropriate for developmental age) Does the patient have difficulty walking or climbing stairs?: No Weakness of Legs: None Weakness of Arms/Hands: None  Home Assistive Devices/Equipment Home Assistive Devices/Equipment: None  Therapy Consults (therapy consults require a physician order) PT Evaluation Needed: No OT Evalulation Needed: No SLP Evaluation Needed: No Abuse/Neglect Assessment (Assessment to be complete while patient is alone) Physical Abuse: Denies Verbal Abuse: Denies Sexual Abuse: Denies Exploitation of patient/patient's resources: Denies Self-Neglect: Denies Values / Beliefs Cultural Requests During Hospitalization: None Spiritual Requests During Hospitalization: None   Advance Directives (For Healthcare) Does patient have an advance directive?: No Would patient like information on creating an advanced directive?: No - patient declined information    Additional Information 1:1 In Past 12 Months?: No CIRT Risk: No Elopement Risk: No Does patient have medical clearance?: No      Disposition:  Disposition Initial Assessment Completed for this Encounter: Yes  On Site Evaluation by:   Reviewed with Physician:    Aamira Bischoff 08/24/2016 5:00 AM

## 2016-08-24 NOTE — BH Assessment (Signed)
Assessment completed. Consulted with Donell SievertSpencer Simon, PA-C who recommends patient be discharged with outpatient resources.   Davina PokeJoVea Brinleigh Tew, LCSW Therapeutic Triage Specialist [facility] 08/24/2016 6:23 AM

## 2016-08-24 NOTE — BH Assessment (Signed)
Spoke with patient regarding disposition. Patient states that he is comfortable with discharge and is [facility]  Patient denies questions or concerns at this time. Resources provided to patient for outpatient resources.   Davina PokeJoVea Laverna Dossett, LCSW Therapeutic Triage Specialist [facility] 08/24/2016 6:46 AM

## 2016-08-24 NOTE — ED Triage Notes (Signed)
Pt states that his girlfriend is at work [employer] and states that he was staying with her aunt; pt states that he didn't "feel comfortable" staying with her aunt and felt like there was somebody that was going to kill him if he stayed there; pt denies SI / HI; pt states that this is the last time it will ever happen because his girlfriend won't be working nights anymore; pt denies hallucinations

## 2016-08-24 NOTE — ED Provider Notes (Addendum)
[facility] Provider Note   CSN: [id number] Arrival date & time: 08/23/16  2351  By signing my name below, I, Majel Homer, attest that this documentation has been prepared under the direction and in the presence of [name], MD . Electronically Signed: Majel Homer, Scribe. 08/24/2016. 2:35 AM.  History   Chief Complaint Chief Complaint  Patient presents with  . Paranoid   The history is provided by the patient. No language interpreter was used.   HPI Comments: Cole Barrett is a 30 y.o. male who presents to the Emergency Department for an evaluation of paranoia that began yesterday evening. Pt reports he was staying at his sister-in-law's house yesterday evening when he suddenly felt like there was someone inside the house that was "going to kill him." He states he only feels this way when he stays at his sister-in-law's house. He notes he only has to stay at her house when his girlfriend works night shifts and states this was the last night he had to stay at her house. Pt reports his sister-in-law called the police on him because she wanted him to get psychiatric help. He denies auditory or visual hallucinations, HI, SI and use of any recreational drugs. Pt admits to drinking 0.5 beers yesterday and smoking 1 pack of cigarettes every 3 days.   Past Medical History:  Diagnosis Date  . Hypercholesterolemia   . Mental disorder   . PE (pulmonary embolism)     Patient Active Problem List   Diagnosis Date Noted  . Pulmonary emboli (HCC) 07/13/2016  . Adjustment disorder with anxiety 03/12/2014  . 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 Acute left flank pain 01/02/2014  . Pulmonary embolism, bilateral (HCC) 01/02/2014   History reviewed. No pertinent surgical history.  Home Medications    Prior to Admission medications   Medication Sig Start Date End Date Taking? Authorizing Provider  pravastatin (PRAVACHOL) 20 MG tablet Take 20 mg by mouth daily.   Yes  Historical Provider, MD  risperiDONE (RISPERDAL) 3 MG tablet Take 3 mg by mouth at bedtime.   Yes Historical Provider, MD  Rivaroxaban (XARELTO) 15 MG TABS tablet Take 15 mg by mouth 2 (two) times daily with a meal.   Yes Historical Provider, MD    Family History No family history on file.  Social History Social History  Substance Use Topics  . Smoking status: Current Every Day Smoker    Packs/day: 0.30    Years: 0.50    Types: Cigarettes  . Smokeless tobacco: Never Used  . Alcohol use Yes     Comment: 1 40 oz per day     Allergies   Haldol [haloperidol]   Review of Systems Review of Systems  Constitutional: Negative for fever.  Psychiatric/Behavioral: Negative for hallucinations and suicidal ideas. The patient is nervous/anxious.    Physical Exam Updated Vital Signs BP 135/69   Pulse 95   Temp 98.3 F (36.8 C) (Oral)   Resp 18   SpO2 96%   Physical Exam  Constitutional: He is oriented to person, place, and time. He appears well-developed and well-nourished.  HENT:  Head: Normocephalic and atraumatic.  Eyes: EOM are normal. Pupils are equal, round, and reactive to light.  Neck: Normal range of motion. Neck supple. No JVD present.  Cardiovascular: Normal rate, regular rhythm and normal heart sounds.   No murmur heard. Pulmonary/Chest: Effort normal and breath sounds normal. He has no wheezes. He has no rales. He exhibits no tenderness.  Abdominal: Soft. Bowel sounds are normal. He exhibits no distension and no mass. There is no tenderness.  Musculoskeletal: Normal range of motion. He exhibits no edema.  Lymphadenopathy:    He has no cervical adenopathy.  Neurological: He is alert and oriented to person, place, and time. No cranial nerve deficit. He exhibits normal muscle tone. Coordination normal.  Skin: Skin is warm and dry. No rash noted.  Psychiatric:  Disordered thought processes with paranoid ideations.   Nursing note and vitals reviewed.  ED Treatments /  Results  Labs (all labs ordered are listed, but only abnormal results are displayed) Labs Reviewed  COMPREHENSIVE METABOLIC PANEL - Abnormal; Notable for the following:       Result Value   Glucose, Bld 115 (*)    Calcium 8.6 (*)    All other components within normal limits  ETHANOL - Abnormal; Notable for the following:    Alcohol, Ethyl (B) 62 (*)    All other components within normal limits  ACETAMINOPHEN LEVEL - Abnormal; Notable for the following:    Acetaminophen (Tylenol), Serum <10 (*)    All other components within normal limits  CBC - Abnormal; Notable for the following:    RBC 4.16 (*)    Hemoglobin 11.5 (*)    HCT 34.1 (*)    All other components within normal limits  SALICYLATE LEVEL  URINE RAPID DRUG SCREEN, HOSP PERFORMED    Radiology  Procedures Procedures (including critical care time)  Medications Ordered in ED Medications  alum & mag hydroxide-simeth (MAALOX/MYLANTA) 200-200-20 MG/5ML suspension 30 mL (not administered)  ondansetron (ZOFRAN) tablet 4 mg (not administered)  nicotine (NICODERM CQ - dosed in mg/24 hr) patch 7 mg (not administered)  ibuprofen (ADVIL,MOTRIN) tablet 600 mg (not administered)  zolpidem (AMBIEN) tablet 5 mg (not administered)  acetaminophen (TYLENOL) tablet 650 mg (not administered)  LORazepam (ATIVAN) tablet 1 mg (1 mg Oral Given 08/24/16 0420)  pravastatin (PRAVACHOL) tablet 20 mg (not administered)  risperiDONE (RISPERDAL) tablet 3 mg (not administered)  Rivaroxaban (XARELTO) tablet 15 mg (not administered)    DIAGNOSTIC STUDIES:  Oxygen Saturation is 96% on RA, normal by my interpretation.    COORDINATION OF CARE:  2:31 AM Discussed treatment plan with pt at bedside and pt agreed to plan.  Initial Impression / Assessment and Plan / ED Course  I have reviewed the triage vital signs and the nursing notes.  Pertinent lab results that were available during my care of the patient were reviewed by me and considered in my  medical decision making (see chart for details).  Clinical Course   Patient with clearly disordered thought processes and some paranoia but no sign of threat to self or others. Old records are reviewed and he did have a recent ED visit with diagnosis of adjustment disorder with anxiety. TTS consultation is obtained and he is felt to not require inpatient care. He is discharged with outpatient behavioral health resources.  I personally performed the services described in this documentation, which was scribed in my presence. The recorded information has been reviewed and is accurate.   Final Clinical Impressions(s) / ED Diagnoses   Final diagnoses:  Paranoid ideation (HCC)  Adjustment disorder with anxiety    New Prescriptions New Prescriptions   No medications on file     Dione Boozeavid Aroura Vasudevan, MD 08/24/16 [id number]    Dione Boozeavid Taquita Demby, MD 08/24/16 [phone]

## 2016-08-24 NOTE — ED Notes (Signed)
Pt presents with paranoia, feeling like someone is going to kill him while staying at girlfriends aunt house.  Pt states his girfriend worked a double shift.  Denies SI, HI or AVH.  Denies feeling hopeless.  Pt disheveled with poor hygiene. A&O x 3.  Monitoring for safety, Q 15 min checks in effect, no distress noted.

## 2016-08-25 ENCOUNTER — Encounter (HOSPITAL_COMMUNITY): Payer: Self-pay

## 2016-08-25 ENCOUNTER — Emergency Department (HOSPITAL_COMMUNITY): Payer: Medicaid Other

## 2016-08-25 ENCOUNTER — Emergency Department (HOSPITAL_COMMUNITY)
Admission: EM | Admit: 2016-08-25 | Discharge: 2016-08-25 | Disposition: A | Discharge status: Home / Self Care | Payer: Medicaid Other | Attending: Emergency Medicine | Admitting: Emergency Medicine

## 2016-08-25 DIAGNOSIS — G4700 Insomnia, unspecified: Secondary | ICD-10-CM

## 2016-08-25 DIAGNOSIS — R440 Auditory hallucinations: Secondary | ICD-10-CM

## 2016-08-25 DIAGNOSIS — F22 Delusional disorders: Secondary | ICD-10-CM

## 2016-08-25 DIAGNOSIS — R091 Pleurisy: Secondary | ICD-10-CM

## 2016-08-25 LAB — COMPREHENSIVE METABOLIC PANEL
ALBUMIN: 3.6 g/dL (ref 3.5–5.0)
ALK PHOS: 44 U/L (ref 38–126)
ALT: 27 U/L (ref 17–63)
ANION GAP: 7 (ref 5–15)
AST: 18 U/L (ref 15–41)
BILIRUBIN TOTAL: 0.7 mg/dL (ref 0.3–1.2)
BUN: 8 mg/dL (ref 6–20)
CALCIUM: 8.6 mg/dL — AB (ref 8.9–10.3)
CO2: 26 mmol/L (ref 22–32)
Chloride: 103 mmol/L (ref 101–111)
Creatinine, Ser: 1.05 mg/dL (ref 0.61–1.24)
GFR calc Af Amer: >60 mL/min (ref >60)
GLUCOSE: 94 mg/dL (ref 65–99)
Potassium: 3.6 mmol/L (ref 3.5–5.1)
Sodium: 136 mmol/L (ref 135–145)
TOTAL PROTEIN: 6.7 g/dL (ref 6.5–8.1)

## 2016-08-25 LAB — CBC
HCT: 35.9 % — ABNORMAL LOW (ref 39.0–52.0)
Hemoglobin: 11.3 g/dL — ABNORMAL LOW (ref 13.0–17.0)
MCH: 26.8 pg (ref 26.0–34.0)
MCHC: 31.5 g/dL (ref 30.0–36.0)
MCV: 85.1 fL (ref 78.0–100.0)
PLATELETS: 398 10*3/uL (ref 150–400)
RBC: 4.22 MIL/uL (ref 4.22–5.81)
RDW: 13.2 % (ref 11.5–15.5)
WBC: 6.4 10*3/uL (ref 4.0–10.5)

## 2016-08-25 LAB — RAPID URINE DRUG SCREEN, HOSP PERFORMED
Amphetamines: NOT DETECTED
Barbiturates: NOT DETECTED
Benzodiazepines: NOT DETECTED
Cocaine: NOT DETECTED
OPIATES: NOT DETECTED
Tetrahydrocannabinol: NOT DETECTED

## 2016-08-25 LAB — ETHANOL: ALCOHOL ETHYL (B): 46 mg/dL — AB (ref <5)

## 2016-08-25 MED ORDER — RISPERIDONE 1 MG PO TABS: 1.0000 mg | ORAL_TABLET | Freq: Every day | ORAL | 0 refills | Status: DC

## 2016-08-25 MED ORDER — IOPAMIDOL (ISOVUE-370) INJECTION 76%
INTRAVENOUS | Status: AC
  Filled 2016-08-25: qty 100

## 2016-08-25 MED ORDER — IOPAMIDOL (ISOVUE-370) INJECTION 76%
80.0000 mL | Freq: Once | INTRAVENOUS | Status: AC | PRN
  Administered 2016-08-25: 80 mL via INTRAVENOUS

## 2016-08-25 MED ORDER — LORAZEPAM 2 MG/ML IJ SOLN
1.0000 mg | Freq: Once | INTRAMUSCULAR | Status: AC
  Administered 2016-08-25: 1 mg via INTRAVENOUS
  Filled 2016-08-25: qty 1

## 2016-08-25 NOTE — ED Provider Notes (Signed)
[facility]EM[facility]Y DEPT Provider Note   CSN: [id number] Arrival date & time: 08/24/16  2358  By signing my name below, I, Suzan SlickAshley N. Elon SpannerLeger, attest that this documentation has been prepared under the direction and in the presence of Arby BarretteMarcy Arisbel Maione, MD.  Electronically Signed: Suzan SlickAshley N. Elon SpannerLeger, ED Scribe. 08/25/16. 3:04 AM.    History   Chief Complaint Chief Complaint  Patient presents with  . Hallucinations   The history is provided by the patient. No language interpreter was used.    HPI Comments: Cole Barrett is a 30 y.o. male who presents to the Emergency Department complaining of intermittent, unchanged auditory hallucinations x 2-3 days. Pt reports 3 episodes of "voices telling me that they are going to kill me". However, he states he does not recognize the voices. No SI or HI at this time. Pt was recently evaluated in the Emergency Department on 08/24/16 for same. At that time, TTS consult performed, however, pt did not meet criteria for inpatient treatment. He was encouraged to follow outpatient with resources given. Currently he is not followed by a psychiatrist here in [place]. However, he was recently started [place]. Pt states he is compliant with this medication and all other prescribed medications. No additional new medications. Denies any illicit drug use or alcohol consumption.  Pt also reports constant, unchanged pain to the L side. Pain is described as cramping. He states pain has been ongoing for several days secondary to a "blood clot". Discomfort is made worse with movement and palpation. No alleviating factors reported.  PCP: Ambrose FinlandValerie A Keck, NP    Past Medical History:  Diagnosis Date  . Hypercholesterolemia   . Mental disorder   . PE (pulmonary embolism)     Patient Active Problem List   Diagnosis Date Noted  . Pulmonary emboli (HCC) 07/13/2016  . Adjustment disorder with anxiety 03/12/2014  . Pulmonary embolism and  infarction (HCC) 01/02/2014  . Acute left flank pain 01/02/2014  . Pulmonary embolism, bilateral (HCC) 01/02/2014    History reviewed. No pertinent surgical history.     Home Medications    Prior to Admission medications   Medication Sig Start Date End Date Taking? Authorizing Provider  pravastatin (PRAVACHOL) 20 MG tablet Take 20 mg by mouth daily.   Yes Historical Provider, MD  risperiDONE (RISPERDAL) 3 MG tablet Take 3 mg by mouth at bedtime.   Yes Historical Provider, MD  Rivaroxaban (XARELTO) 15 MG TABS tablet Take 15 mg by mouth 2 (two) times daily with a meal.   Yes Historical Provider, MD  risperiDONE (RISPERDAL) 1 MG tablet Take 1 tablet (1 mg total) by mouth daily before breakfast. 08/25/16   Arby BarretteMarcy Login Muckleroy, MD    Family History No family history on file.  Social History Social History  Substance Use Topics  . Smoking status: Current Every Day Smoker    Packs/day: 0.30    Years: 0.50    Types: Cigarettes  . Smokeless tobacco: Never Used  . Alcohol use Yes     Comment: 1 40 oz per day     Allergies   Haldol [haloperidol]   Review of Systems Review of Systems  Constitutional: Negative for chills and fever.  Musculoskeletal: Positive for arthralgias.  Psychiatric/Behavioral: Positive for hallucinations. Negative for suicidal ideas.  All other systems reviewed and are negative.    Physical Exam Updated Vital Signs BP 110/67   Pulse 84   Temp 99.1 F (37.3 C) (Oral)  Resp 18   SpO2 98%   Physical Exam  Constitutional: He is oriented to person, place, and time. He appears well-developed and well-nourished.  HENT:  Head: Normocephalic and atraumatic.  Eyes: EOM are normal.  Neck: Normal range of motion.  Cardiovascular: Normal rate, regular rhythm, normal heart sounds and intact distal pulses.   Pulmonary/Chest: Effort normal and breath sounds normal. No respiratory distress.  Abdominal: Soft. He exhibits no distension. There is no tenderness.    Musculoskeletal: Normal range of motion.  Neurological: He is alert and oriented to person, place, and time.  Skin: Skin is warm and dry.  Psychiatric: He has a normal mood and affect. Judgment normal.  Nursing note and vitals reviewed.    ED Treatments / Results   DIAGNOSTIC STUDIES: Oxygen Saturation is 98% on RA, Normal by my interpretation.    COORDINATION OF CARE: 1:26 AM- Will order blood work. Discussed treatment plan with pt at bedside and pt agreed to plan.     Labs (all labs ordered are listed, but only abnormal results are displayed) Labs Reviewed  COMPREHENSIVE METABOLIC PANEL - Abnormal; Notable for the following:       Result Value   Calcium 8.6 (*)    All other components within normal limits  ETHANOL - Abnormal; Notable for the following:    Alcohol, Ethyl (B) 46 (*)    All other components within normal limits  CBC - Abnormal; Notable for the following:    Hemoglobin 11.3 (*)    HCT 35.9 (*)    All other components within normal limits  URINE RAPID DRUG SCREEN, HOSP PERFORMED    EKG  EKG Interpretation None       Radiology Ct Angio Chest Pe W/cm &/or Wo Cm  Result Date: 08/25/2016 CLINICAL DATA:  30 year old male with left-sided chest pain EXAM: CT ANGIOGRAPHY CHEST WITH CONTRAST TECHNIQUE: Multidetector CT imaging of the chest was performed using the standard protocol during bolus administration of intravenous contrast. Multiplanar CT image reconstructions and MIPs were obtained to evaluate the vascular anatomy. CONTRAST:  80 cc Isovue 370 COMPARISON:  Chest radiograph dated 08/22/2016 and chest CT dated 07/13/2016 FINDINGS: There is nonocclusive filling defect within the right lower lobe subsegmental pulmonary artery branch (series 6, image 151) in similar location as the prior CT most likely representing residual clot or scarring. There has been interval decrease in the size of on this clot since the prior study. No acute or new pulmonary artery emboli  identified. There is stable mild cardiomegaly. No pericardial effusion. Right hilar and subcarinal adenopathy. Top-normal left hilar lymph nodes noted. The thoracic aorta appears unremarkable. The origins of the great vessels of the aortic arch appear patent. There is a small left pleural effusion with increase in size compared to prior study. Fluid is noted tracking along the left major fissure as well as along the mediastinal for surface. There is consolidative changes and airspace ground-glass density in the left lower lobe and left lung base which may represent compressive atelectasis versus pneumonia. The right lung is clear. There is no pneumothorax. The central airways are patent. The esophagus is grossly unremarkable. A small hiatal hernia noted. No thyroid nodules identified. There is no axillary adenopathy. The chest wall soft tissues appear unremarkable. The osseous structures are intact. The visualized upper abdomen is grossly unremarkable. IMPRESSION: No new or acute pulmonary embolism. Residual clot/scarring from the prior PG noted in the subsegmental right lower lobe pulmonary artery branch. Partial consolidative changes of the  left lower lobe which may represent atelectasis versus infiltrate. Clinical correlation is recommended. Electronically Signed   By: Elgie Collard M.D.   On: 08/25/2016 04:49    Procedures Procedures (including critical care time)  Medications Ordered in ED Medications  LORazepam (ATIVAN) injection 1 mg (1 mg Intravenous Given 08/25/16 0358)  iopamidol (ISOVUE-370) 76 % injection 80 mL (80 mLs Intravenous Contrast Given 08/25/16 0418)     Initial Impression / Assessment and Plan / ED Course  I have reviewed the triage vital signs and the nursing notes.  Pertinent labs & imaging results that were available during my care of the patient were reviewed by me and considered in my medical decision making (see chart for details).  Clinical Course    Final Clinical  Impressions(s) / ED Diagnoses   Final diagnoses:  Auditory hallucinations  Paranoia (HCC)  Pleurisy  Insomnia   Patient is complaining of auditory hallucinations of someone say they're going to kill him. He denies any other visual hallucinations or confusion. He does report he has had difficulty sleeping. Patient takes risperidone at night. He reports he had a 2 week mental health hospitalization last month. At this time, there is no signs of suicidal or homicidal ideation. Patient is alert and lucid. He reports he hasn't Risperdal which she started several weeks ago has been helpful. He reports these auditory hallucinations just started in about the past 3 days. At this time, I will increase his Risperdal dose to 1 mg in the morning and he will continue his evening dose of 3 mg. Patient is strongly advised to follow-up with behavioral health. He reports he plans to do that and is going to use resource guide to make phone calls.  Patient has history of pulmonary embolus. Throughout my evaluation exam he would hold his left side of his chest and say "my PEs are cramping up." I had concern due to review of prior medical record and patient had recurrent PE. I did proceed with CT study which does not show any new pulmonary embolus but prior areas of scarring likely accounting for the patient's pain. Fever or leukocytosis to suggest acute pneumonia. He does not have dyspnea. At this time he will continue his Xarelto.  At discharge the patient is in a good mood. He is sitting up to breakfast and agreeable with the plan. New Prescriptions New Prescriptions   RISPERIDONE (RISPERDAL) 1 MG TABLET    Take 1 tablet (1 mg total) by mouth daily before breakfast.      Arby Barrette, MD 08/25/16 [phone]

## 2016-08-25 NOTE — ED Notes (Signed)
Pt. Ambulated to shower room with EMT. Pt. Given belongings back to change into following shower.

## 2016-08-25 NOTE — ED Notes (Signed)
Pt to CT

## 2016-08-25 NOTE — ED Triage Notes (Signed)
Pt states that he was picked up by the police for hearing voices. Pt denies SI/HI, states he hears voices that are telling him that they are going to kill him.

## 2016-08-25 NOTE — Discharge Instructions (Signed)
Take a 1 mg risperidone tablet in the morning at breakfast time. Continue your evening dose of 3 mg. You must have a follow-up very soon with a psychiatrist or counselor. Use the resource guide to contact behavioral health for ongoing treatment. He will also need a family doctor, use the referral number and the resource guide to find one as well.

## 2016-08-26 ENCOUNTER — Encounter (HOSPITAL_COMMUNITY): Payer: Self-pay | Admitting: Emergency Medicine

## 2016-08-26 ENCOUNTER — Emergency Department (HOSPITAL_COMMUNITY)
Admission: EM | Admit: 2016-08-26 | Discharge: 2016-08-26 | Disposition: A | Discharge status: Other Acute Inpatient Hospital | Payer: Medicaid Other | Attending: Emergency Medicine | Admitting: Emergency Medicine

## 2016-08-26 ENCOUNTER — Inpatient Hospital Stay (HOSPITAL_COMMUNITY)
Admission: AD | Admit: 2016-08-26 | Discharge: 2016-08-30 | DRG: 885 | Disposition: A | Discharge status: Home / Self Care | Payer: Medicaid Other | Source: Intra-hospital | Attending: Psychiatry | Admitting: Psychiatry

## 2016-08-26 ENCOUNTER — Encounter (HOSPITAL_COMMUNITY): Payer: Self-pay | Admitting: *Deleted

## 2016-08-26 DIAGNOSIS — F23 Brief psychotic disorder: Secondary | ICD-10-CM | POA: Diagnosis present

## 2016-08-26 DIAGNOSIS — F1290 Cannabis use, unspecified, uncomplicated: Secondary | ICD-10-CM | POA: Diagnosis not present

## 2016-08-26 DIAGNOSIS — F209 Schizophrenia, unspecified: Secondary | ICD-10-CM

## 2016-08-26 DIAGNOSIS — Z7901 Long term (current) use of anticoagulants: Secondary | ICD-10-CM

## 2016-08-26 DIAGNOSIS — F17210 Nicotine dependence, cigarettes, uncomplicated: Secondary | ICD-10-CM

## 2016-08-26 DIAGNOSIS — R443 Hallucinations, unspecified: Secondary | ICD-10-CM

## 2016-08-26 DIAGNOSIS — Z046 Encounter for general psychiatric examination, requested by authority: Secondary | ICD-10-CM | POA: Diagnosis present

## 2016-08-26 DIAGNOSIS — F200 Paranoid schizophrenia: Principal | ICD-10-CM | POA: Diagnosis present

## 2016-08-26 DIAGNOSIS — R440 Auditory hallucinations: Secondary | ICD-10-CM | POA: Diagnosis present

## 2016-08-26 DIAGNOSIS — Z79899 Other long term (current) drug therapy: Secondary | ICD-10-CM

## 2016-08-26 DIAGNOSIS — F1210 Cannabis abuse, uncomplicated: Secondary | ICD-10-CM | POA: Diagnosis present

## 2016-08-26 DIAGNOSIS — Z791 Long term (current) use of non-steroidal anti-inflammatories (NSAID): Secondary | ICD-10-CM

## 2016-08-26 DIAGNOSIS — F1099 Alcohol use, unspecified with unspecified alcohol-induced disorder: Secondary | ICD-10-CM

## 2016-08-26 DIAGNOSIS — Z86711 Personal history of pulmonary embolism: Secondary | ICD-10-CM | POA: Diagnosis not present

## 2016-08-26 DIAGNOSIS — Z5321 Procedure and treatment not carried out due to patient leaving prior to being seen by health care provider: Secondary | ICD-10-CM | POA: Diagnosis not present

## 2016-08-26 DIAGNOSIS — R45851 Suicidal ideations: Secondary | ICD-10-CM | POA: Diagnosis not present

## 2016-08-26 HISTORY — DX: Schizophrenia, unspecified: F20.9

## 2016-08-26 HISTORY — DX: Homelessness unspecified: Z59.00

## 2016-08-26 HISTORY — DX: Delusional disorders: F22

## 2016-08-26 HISTORY — DX: Homelessness: Z59.0

## 2016-08-26 LAB — CBC WITH DIFFERENTIAL/PLATELET
BASOS PCT: 0 %
Basophils Absolute: 0 10*3/uL (ref 0.0–0.1)
EOS ABS: 0.6 10*3/uL (ref 0.0–0.7)
EOS PCT: 9 %
HCT: 35.8 % — ABNORMAL LOW (ref 39.0–52.0)
HEMOGLOBIN: 11.5 g/dL — AB (ref 13.0–17.0)
LYMPHS ABS: 1.9 10*3/uL (ref 0.7–4.0)
Lymphocytes Relative: 27 %
MCH: 27.4 pg (ref 26.0–34.0)
MCHC: 32.1 g/dL (ref 30.0–36.0)
MCV: 85.2 fL (ref 78.0–100.0)
MONOS PCT: 7 %
Monocytes Absolute: 0.5 10*3/uL (ref 0.1–1.0)
NEUTROS PCT: 57 %
Neutro Abs: 3.9 10*3/uL (ref 1.7–7.7)
PLATELETS: 386 10*3/uL (ref 150–400)
RBC: 4.2 MIL/uL — AB (ref 4.22–5.81)
RDW: 13.2 % (ref 11.5–15.5)
WBC: 6.9 10*3/uL (ref 4.0–10.5)

## 2016-08-26 LAB — COMPREHENSIVE METABOLIC PANEL
ALBUMIN: 3.5 g/dL (ref 3.5–5.0)
ALK PHOS: 44 U/L (ref 38–126)
ALT: 24 U/L (ref 17–63)
ANION GAP: 9 (ref 5–15)
AST: 17 U/L (ref 15–41)
BUN: 8 mg/dL (ref 6–20)
CALCIUM: 8.5 mg/dL — AB (ref 8.9–10.3)
CHLORIDE: 104 mmol/L (ref 101–111)
CO2: 23 mmol/L (ref 22–32)
Creatinine, Ser: 1.02 mg/dL (ref 0.61–1.24)
GFR calc non Af Amer: >60 mL/min (ref >60)
GLUCOSE: 96 mg/dL (ref 65–99)
POTASSIUM: 3.7 mmol/L (ref 3.5–5.1)
SODIUM: 136 mmol/L (ref 135–145)
Total Bilirubin: 0.5 mg/dL (ref 0.3–1.2)
Total Protein: 6.8 g/dL (ref 6.5–8.1)

## 2016-08-26 LAB — RAPID URINE DRUG SCREEN, HOSP PERFORMED
Amphetamines: NOT DETECTED
BARBITURATES: NOT DETECTED
BENZODIAZEPINES: NOT DETECTED
COCAINE: NOT DETECTED
OPIATES: NOT DETECTED
Tetrahydrocannabinol: NOT DETECTED

## 2016-08-26 LAB — ETHANOL: ALCOHOL ETHYL (B): 69 mg/dL — AB (ref <5)

## 2016-08-26 MED ORDER — OLANZAPINE 5 MG PO TBDP
5.0000 mg | ORAL_TABLET | Freq: Three times a day (TID) | ORAL | Status: DC | PRN
  Administered 2016-08-26 – 2016-08-28 (×3): 5 mg via ORAL
  Filled 2016-08-26 (×3): qty 1

## 2016-08-26 MED ORDER — ALUM & MAG HYDROXIDE-SIMETH 200-200-20 MG/5ML PO SUSP: 30.0000 mL | ORAL | Status: DC | PRN

## 2016-08-26 MED ORDER — ZOLPIDEM TARTRATE 5 MG PO TABS: 5.0000 mg | ORAL_TABLET | Freq: Every evening | ORAL | Status: DC | PRN

## 2016-08-26 MED ORDER — RISPERIDONE 3 MG PO TABS: 3.0000 mg | ORAL_TABLET | Freq: Every day | ORAL | Status: DC

## 2016-08-26 MED ORDER — ACETAMINOPHEN 325 MG PO TABS: 650.0000 mg | ORAL_TABLET | Freq: Four times a day (QID) | ORAL | Status: DC | PRN

## 2016-08-26 MED ORDER — RIVAROXABAN 20 MG PO TABS
20.0000 mg | ORAL_TABLET | Freq: Every day | ORAL | Status: DC
  Administered 2016-08-26: 20 mg via ORAL
  Filled 2016-08-26: qty 1

## 2016-08-26 MED ORDER — NICOTINE 21 MG/24HR TD PT24: 21.0000 mg | MEDICATED_PATCH | Freq: Every day | TRANSDERMAL | Status: DC

## 2016-08-26 MED ORDER — IBUPROFEN 400 MG PO TABS: 600.0000 mg | ORAL_TABLET | Freq: Three times a day (TID) | ORAL | Status: DC | PRN

## 2016-08-26 MED ORDER — RIVAROXABAN 15 MG PO TABS: 15.0000 mg | ORAL_TABLET | Freq: Two times a day (BID) | ORAL | Status: DC

## 2016-08-26 MED ORDER — LORAZEPAM 1 MG PO TABS: 1.0000 mg | ORAL_TABLET | ORAL | Status: DC | PRN

## 2016-08-26 MED ORDER — ZIPRASIDONE MESYLATE 20 MG IM SOLR: 20.0000 mg | INTRAMUSCULAR | Status: DC | PRN

## 2016-08-26 MED ORDER — ONDANSETRON HCL 4 MG PO TABS: 4.0000 mg | ORAL_TABLET | Freq: Three times a day (TID) | ORAL | Status: DC | PRN

## 2016-08-26 MED ORDER — PRAVASTATIN SODIUM 40 MG PO TABS
20.0000 mg | ORAL_TABLET | Freq: Every day | ORAL | Status: DC
  Administered 2016-08-26: 20 mg via ORAL
  Filled 2016-08-26: qty 1

## 2016-08-26 MED ORDER — LORAZEPAM 1 MG PO TABS: 1.0000 mg | ORAL_TABLET | Freq: Four times a day (QID) | ORAL | Status: DC | PRN

## 2016-08-26 MED ORDER — TRAZODONE HCL 50 MG PO TABS
50.0000 mg | ORAL_TABLET | Freq: Every evening | ORAL | Status: DC | PRN
  Administered 2016-08-26 – 2016-08-29 (×4): 50 mg via ORAL
  Filled 2016-08-26 (×2): qty 1
  Filled 2016-08-26: qty 14
  Filled 2016-08-26 (×6): qty 1
  Filled 2016-08-26: qty 14
  Filled 2016-08-26: qty 1

## 2016-08-26 MED ORDER — MAGNESIUM HYDROXIDE 400 MG/5ML PO SUSP: 30.0000 mL | Freq: Every day | ORAL | Status: DC | PRN

## 2016-08-26 MED ORDER — ACETAMINOPHEN 325 MG PO TABS: 650.0000 mg | ORAL_TABLET | ORAL | Status: DC | PRN

## 2016-08-26 MED ORDER — RISPERIDONE 1 MG PO TABS
1.0000 mg | ORAL_TABLET | Freq: Every day | ORAL | Status: DC
  Administered 2016-08-26: 1 mg via ORAL
  Filled 2016-08-26: qty 1

## 2016-08-26 NOTE — ED Notes (Addendum)
Attempted Report. Nurse Unavailable to take report

## 2016-08-26 NOTE — [facility] Counselor (Signed)
[facility] Assessment Progress Note  [facility] Can come anytime. Accepting doctor is [place]. Call report to [phone].  Johny ShockSamantha M. Ladona Ridgelaylor, MS, NCC, LPCA Counselor

## 2016-08-26 NOTE — ED Provider Notes (Signed)
8:30 PM  Nursing informed this examiner that the patient was accepted to the behavioral health unit.  Patient was examined and had no new complaints or concerns. Patient agreed with plan for further inpatient management Of hallucinations and voices.   Patient transferred in stable condition.   Canary Brimhristopher J Tegeler, MD 08/27/16 [phone]

## 2016-08-26 NOTE — ED Notes (Signed)
Patient was given a [place] sandwich bag with a sprite and a rice crispy treat.

## 2016-08-26 NOTE — ED Triage Notes (Addendum)
Pt. reports auditory hallucinations , anxiety , insomnia and paranoid behavior for several days , denies suicidal ideations.

## 2016-08-26 NOTE — Tx Team (Signed)
Initial Treatment Plan 08/26/2016 10:36 PM Cole ChessChristopher A Paschal [id number]    PATIENT STRESSORS: Substance abuse   PATIENT STRENGTHS: Ability for insight Capable of independent living Motivation for treatment/growth Supportive family/friends   PATIENT IDENTIFIED PROBLEMS: "Get the Dr to keep talking to me to get these voices to stop"    Psychosis  Increased risk for suicide               DISCHARGE CRITERIA:  Ability to meet basic life and health needs Adequate post-discharge living arrangements Improved stabilization in mood, thinking, and/or behavior Medical problems require only outpatient monitoring Motivation to continue treatment in a less acute level of care Need for constant or close observation no longer present Safe-care adequate arrangements made Verbal commitment to aftercare and medication compliance  PRELIMINARY DISCHARGE PLAN: Outpatient therapy Return to previous living arrangement  PATIENT/FAMILY INVOLVEMENT: This treatment plan has been presented to and reviewed with the patient, Cole Barrett, and/or family member.  The patient and family have been given the opportunity to ask questions and make suggestions.  Cole Barrett, Cole Depaulo [name], RN 08/26/2016, 10:36 PM

## 2016-08-26 NOTE — ED Notes (Signed)
Patient loud at registration stating he needs the [facility].  Stated he is hearing voices.  Explained to patient that he needed to be medically ceared then could possibly go.  Stated "then damn hurry up so I can get some help".  Security aware of patients presence

## 2016-08-26 NOTE — ED Notes (Signed)
Called staffing for sitter 

## 2016-08-26 NOTE — ED Notes (Addendum)
Attempted Report told to call back in 

## 2016-08-26 NOTE — ED Provider Notes (Signed)
[facility] Provider Note   CSN: [id number] Arrival date & time: 08/26/16  0245  History   Chief Complaint Chief Complaint  Patient presents with  . Hallucinations    HPI Cole Barrett is a 30 y.o. male.  HPI   Pt has a PMH of homelessness, hypercholesterolemia, mental disorder, paranoid behavior and PE comes to the ER with complaints of hearing voices that are telling him they are going to kill him. The patient is agitated and said that [facility] needs to [facility]. He says that he can't handle the voices. He denies SI/HI, denies substance abuse, denies doing anything to harm himself but says the voices are very disturbing and interrupting his life.  Past Medical History:  Diagnosis Date  . Homelessness   . Hypercholesterolemia   . Mental disorder   . Paranoid behavior (HCC)   . PE (pulmonary embolism)     Patient Active Problem List   Diagnosis Date Noted  . Pulmonary emboli (HCC) 07/13/2016  . Adjustment disorder with anxiety 03/12/2014  . 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 Acute left flank pain 01/02/2014  . Pulmonary embolism, bilateral (HCC) 01/02/2014    History reviewed. No pertinent surgical history.  Home Medications    Prior to Admission medications   Medication Sig Start Date End Date Taking? Authorizing Provider  pravastatin (PRAVACHOL) 20 MG tablet Take 20 mg by mouth daily.    Historical Provider, MD  risperiDONE (RISPERDAL) 1 MG tablet Take 1 tablet (1 mg total) by mouth daily before breakfast. 08/25/16   Arby BarretteMarcy Pfeiffer, MD  risperiDONE (RISPERDAL) 3 MG tablet Take 3 mg by mouth at bedtime.    Historical Provider, MD  Rivaroxaban (XARELTO) 15 MG TABS tablet Take 15 mg by mouth 2 (two) times daily with a meal.    Historical Provider, MD    Family History No family history on file.  Social History Social History  Substance Use Topics  . Smoking status: Current Every Day Smoker    Types: Cigarettes  . Smokeless  tobacco: Never Used  . Alcohol use Yes     Comment: 1 40 oz per day    Allergies   Haldol [haloperidol]   Review of Systems Review of Systems Level V Caveat- hallucinations  Physical Exam Updated Vital Signs BP 118/72 (BP Location: Right Arm)   Pulse 104   Temp 98.7 F (37.1 C) (Oral)   Resp 18   Wt 102.1 kg   SpO2 98%   BMI 32.28 kg/m   Physical Exam  Constitutional: He appears well-developed and well-nourished. No distress.  HENT:  Head: Normocephalic and atraumatic.  Nose: Nose normal.  Mouth/Throat: Uvula is midline.  Eyes: Pupils are equal, round, and reactive to light.  Neck: Normal range of motion. Neck supple.  Cardiovascular: Normal rate and regular rhythm.   Pulmonary/Chest: Effort normal.  Abdominal: Soft.  Neurological: He is alert.  Skin: Skin is warm and dry. No rash noted.  Psychiatric: Thought content normal. His mood appears anxious. His speech is rapid and/or pressured. He is actively hallucinating. He expresses no homicidal and no suicidal ideation.  Nursing note and vitals reviewed.   ED Treatments / Results  Labs (all labs ordered are listed, but only abnormal results are displayed) Labs Reviewed  CBC WITH DIFFERENTIAL/PLATELET - Abnormal; Notable for the following:       Result Value   RBC 4.20 (*)    Hemoglobin 11.5 (*)    HCT 35.8 (*)  All other components within normal limits  ETHANOL  URINE RAPID DRUG SCREEN, HOSP PERFORMED  COMPREHENSIVE METABOLIC PANEL    EKG  EKG Interpretation None       Radiology Ct Angio Chest Pe W/cm &/or Wo Cm  Result Date: 08/25/2016 CLINICAL DATA:  30 year old male with left-sided chest pain EXAM: CT ANGIOGRAPHY CHEST WITH CONTRAST TECHNIQUE: Multidetector CT imaging of the chest was performed using the standard protocol during bolus administration of intravenous contrast. Multiplanar CT image reconstructions and MIPs were obtained to evaluate the vascular anatomy. CONTRAST:  80 cc Isovue 370  COMPARISON:  Chest radiograph dated 08/22/2016 and chest CT dated 07/13/2016 FINDINGS: There is nonocclusive filling defect within the right lower lobe subsegmental pulmonary artery branch (series 6, image 151) in similar location as the prior CT most likely representing residual clot or scarring. There has been interval decrease in the size of on this clot since the prior study. No acute or new pulmonary artery emboli identified. There is stable mild cardiomegaly. No pericardial effusion. Right hilar and subcarinal adenopathy. Top-normal left hilar lymph nodes noted. The thoracic aorta appears unremarkable. The origins of the great vessels of the aortic arch appear patent. There is a small left pleural effusion with increase in size compared to prior study. Fluid is noted tracking along the left major fissure as well as along the mediastinal for surface. There is consolidative changes and airspace ground-glass density in the left lower lobe and left lung base which may represent compressive atelectasis versus pneumonia. The right lung is clear. There is no pneumothorax. The central airways are patent. The esophagus is grossly unremarkable. A small hiatal hernia noted. No thyroid nodules identified. There is no axillary adenopathy. The chest wall soft tissues appear unremarkable. The osseous structures are intact. The visualized upper abdomen is grossly unremarkable. IMPRESSION: No new or acute pulmonary embolism. Residual clot/scarring from the prior PG noted in the subsegmental right lower lobe pulmonary artery branch. Partial consolidative changes of the left lower lobe which may represent atelectasis versus infiltrate. Clinical correlation is recommended. Electronically Signed   By: Elgie Collard M.D.   On: 08/25/2016 04:49    Procedures Procedures (including critical care time)  Medications Ordered in ED Medications - No data to display   Initial Impression / Assessment and Plan / ED Course  I  have reviewed the triage vital signs and the nursing notes.  Pertinent labs & imaging results that were available during my care of the patient were reviewed by me and considered in my medical decision making (see chart for details).  Clinical Course    Psych holding orders placed Home meds reviewed and ordered as appropriate TTS consult ordered Labs pending  Vitals:   08/26/16 0250  BP: 118/72  Pulse: 104  Resp: 18  Temp: 98.7 F (37.1 C)     Final Clinical Impressions(s) / ED Diagnoses   Final diagnoses:  Hallucinations  Auditory hallucination    New Prescriptions New Prescriptions   No medications on file     Marlon Pel, PA-C 08/26/16 0406    Arby Barrette, MD 09/01/16 1537

## 2016-08-26 NOTE — ED Notes (Signed)
Meal tray ordered 

## 2016-08-26 NOTE — ED Notes (Signed)
Attempted report to [facility] 

## 2016-08-26 NOTE — BH Assessment (Addendum)
Tele Assessment Note   Cole Barrett is a 30 y.o. male [facility] hallucinations telling him they're going to kill him. Pt indicates he's been hearing these voices for a week. He reports that he has never heard voices before a week ago and he wants them to stop. Pt indicates that he's been hearing the voices on and off in the past week and has come up to the ED every time he's heard them. Pt states that he has been taking Risperdal for @ 2 months and received the rx in [place], [place] after staying in an apparent psychiatic facility up there for 2 weeks. Pt was unclear as to his mental health diagnosis. Pt denies SI, HI, hx of violence, or drug/alcohol abuse.   Diagnosis: Schizoaffective d/o, bipolar type  Past Medical History:  Past Medical History:  Diagnosis Date  . Homelessness   . Hypercholesterolemia   . Mental disorder   . Paranoid behavior (HCC)   . PE (pulmonary embolism)     History reviewed. No pertinent surgical history.  Family History: No family history on file.  Social History:  reports that he has been smoking Cigarettes.  He has never used smokeless tobacco. He reports that he drinks alcohol. He reports that he uses drugs, including Marijuana.  Additional Social History:  Alcohol / Drug Use Pain Medications: Denies Prescriptions: Denies Over the Counter: Denies History of alcohol / drug use?: No history of alcohol / drug abuse  CIWA: CIWA-Ar BP: 143/83 Pulse Rate: 101 COWS:    PATIENT STRENGTHS: (choose at least two) Active sense of humor Average or above average intelligence Capable of independent living Communication skills Motivation for treatment/growth  Allergies:  Allergies  Allergen Reactions  . Haldol [Haloperidol] Shortness Of Breath    Home Medications:  (Not in a hospital admission)  OB/GYN Status:  No LMP for male patient.  General Assessment Data [facility] Assessment:  In system Is this a Tele or Face-to-Face Assessment?: Tele Assessment Is this an Initial Assessment or a Re-assessment for this encounter?: Initial Assessment Marital status: Single Living Arrangements: Alone Can pt return to current living arrangement?: Yes Admission Status: Voluntary Is patient capable of signing voluntary admission?: Yes Referral Source: Self/Family/Friend Insurance type: Medicaid     Crisis Care Plan Living Arrangements: Alone Name of Psychiatrist: None Name of Therapist: None  Education Status Is patient currently in school?: No  Risk to self with the past 6 months Suicidal Ideation: No Has patient been a risk to self within the past 6 months prior to admission? : No Suicidal Intent: No Has patient had any suicidal intent within the past 6 months prior to admission? : No Is patient at risk for suicide?: No Suicidal Plan?: No Has patient had any suicidal plan within the past 6 months prior to admission? : No Access to Means: No What has been your use of drugs/alcohol within the last 12 months?: pt endorses sporadic drinking-no abuse Previous Attempts/Gestures: No Intentional Self Injurious Behavior: None Family Suicide History: No Recent stressful life event(s): Other (Comment) (auditory hallucinations) Persecutory voices/beliefs?: No Depression: No Depression Symptoms: Feeling angry/irritable Substance abuse history and/or treatment for substance abuse?: No Suicide prevention information given to non-admitted patients: Not applicable  Risk to Others within the past 6 months Homicidal Ideation: No Does patient have any lifetime risk of violence toward others beyond the six months prior to admission? : No Thoughts of Harm to Others: No  Current Homicidal Intent: No Current Homicidal Plan: No Access to Homicidal Means: No History of harm to others?: No Assessment of Violence: None Noted Does patient have access to weapons?: No Criminal Charges  Pending?: No Does patient have a court date: Yes Court Date: 09/23/16 Is patient on probation?: No  Psychosis Hallucinations: Auditory Delusions: None noted  Mental Status Report Appearance/Hygiene: Unremarkable Eye Contact: Good Motor Activity: Unremarkable Speech: Logical/coherent Level of Consciousness: Alert Mood: Pleasant, Anxious Affect: Appropriate to circumstance Anxiety Level: Minimal Thought Processes: Coherent, Relevant Judgement: Partial Orientation: Person, Place, Time, Situation, Appropriate for developmental age Obsessive Compulsive Thoughts/Behaviors: None  Cognitive Functioning Concentration: Normal Memory: Recent Intact, Remote Intact IQ: Average Insight: see judgement above Impulse Control: Good Appetite: Fair Sleep: No Change Vegetative Symptoms: None  ADLScreening [facility] Assessment Services) Patient's cognitive ability adequate to safely complete daily activities?: Yes Patient able to express need for assistance with ADLs?: Yes Independently performs ADLs?: Yes (appropriate for developmental age)  Prior Inpatient Therapy Prior Inpatient Therapy: Yes Prior Therapy Dates: @2  months ago Prior Therapy Facilty/Provider(s): facility in [place], [place] Reason for Treatment: unclear mental issue  Prior Outpatient Therapy Prior Outpatient Therapy: No Does patient have an ACCT team?: No Does patient have Intensive In-House Services?  : No Does patient have [facility] services? : No (had in the past (2014)) Does patient have P4CC services?: No  ADL Screening (condition at time of admission) Patient's cognitive ability adequate to safely complete daily activities?: Yes Is the patient deaf or have difficulty hearing?: No Does the patient have difficulty seeing, even when wearing glasses/contacts?: No Does the patient have difficulty concentrating, remembering, or making decisions?: No Patient able to express need for assistance with ADLs?: Yes Does the patient  have difficulty dressing or bathing?: No Independently performs ADLs?: Yes (appropriate for developmental age) Does the patient have difficulty walking or climbing stairs?: No Weakness of Legs: None Weakness of Arms/Hands: None  Home Assistive Devices/Equipment Home Assistive Devices/Equipment: None  Therapy Consults (therapy consults require a physician order) PT Evaluation Needed: No OT Evalulation Needed: No SLP Evaluation Needed: No Abuse/Neglect Assessment (Assessment to be complete while patient is alone) Physical Abuse: Denies Verbal Abuse: Denies Sexual Abuse: Denies Exploitation of patient/patient's resources: Denies Self-Neglect: Denies Values / Beliefs Cultural Requests During Hospitalization: None Spiritual Requests During Hospitalization: None Consults Spiritual Care Consult Needed: No Social Work Consult Needed: No Merchant navy officer (For Healthcare) Does patient have an advance directive?: No Would patient like information on creating an advanced directive?: No - patient declined information    Additional Information 1:1 In Past 12 Months?: No CIRT Risk: No Elopement Risk: No Does patient have medical clearance?: Yes     Disposition:  Disposition Initial Assessment Completed for this Encounter: Yes (consulted with Dr. Jama Flavors) Disposition of Patient: Inpatient treatment program Type of inpatient treatment program: Adult (TTS to seek placement)  Laddie Aquas 08/26/2016 1:36 PM

## 2016-08-26 NOTE — ED Notes (Signed)
Ordered breakfast tray  

## 2016-08-26 NOTE — ED Notes (Signed)
[facility] contacted to transport patient to Behavioral Health 

## 2016-08-26 NOTE — Consult Note (Signed)
[facility] Psychiatry Consult   Reason for Consult:  hallucinations Referring Physician:  ED Physician Patient Identification: Cole Barrett MRN:  [id number] Principal Diagnosis:  Psychosis  Diagnosis:   Patient Active Problem List   Diagnosis Date Noted  . Pulmonary emboli ([place]) [I26.99] 07/13/2016  . Adjustment disorder with anxiety [F43.22] 03/12/2014  . Pulmonary embolism and infarction ([place]) [I26.99] 01/02/2014  . Acute left flank pain [R10.12] 01/02/2014  . Pulmonary embolism, bilateral ([place]) [I26.99] 01/02/2014    Total Time spent with patient: 30 minutes  Subjective:   Cole Barrett is a 30 y.o. male patient admitted with hallucinations   HPI:  Patient is a 30 year old male- fair historian at this time.  States he has been experiencing intermittent auditory hallucinations over recent days- he describes this as a threatening voice telling him he is going to be killed . Hallucinatory experiences are of short duration but have recurred several times, and are very distressing to him. He reports he has come to the hospital twice before over recent days " because of the voice ".  Please refer to psychiatry consultation by Dr. Darleene Cleaver, dated 9/24.  He initially denied prior psychiatric history, but later during session reported having been diagnosed with Schizophrenia in the past, and being prescribed Risperidone in [place] in the past . Of note, patient denies feeling depressed, does not endorse major neuro-vegetative symptoms of depression, denies suicidal or homicidal ideations . Although no delusions are expressed, he does appear vaguely guarded, suspicious, fearful, and, for example, requested that his room door be kept open as feels apprehensive if he " cannot look out into the hallway" Admission UDS negative , but BAL (+) at 69 on 9/29.    Past Psychiatric History: patient is endorsing a prior diagnosis of Schizophrenia, although at this time does not  endorse prior psychiatric admissions . He is denying history of severe depression, of mania or of PTSD. Denies Panic or agoraphobia. Denies history of suicide attempts or history of violence . PTA medication list includes Risperidone 1 mg QAM and 3 mg QHS- it is unclear if patient has been taking this medication . He denies alcohol or drug abuse , states he does not drink regularly   Risk to Self: Suicidal Ideation: No Suicidal Intent: No Is patient at risk for suicide?: No Suicidal Plan?: No Access to Means: No What has been your use of drugs/alcohol within the last 12 months?: pt endorses sporadic drinking-no abuse Intentional Self Injurious Behavior: None Risk to Others: Homicidal Ideation: No Thoughts of Harm to Others: No Current Homicidal Intent: No Current Homicidal Plan: No Access to Homicidal Means: No History of harm to others?: No Assessment of Violence: None Noted Does patient have access to weapons?: No Criminal Charges Pending?: No Does patient have a court date: Yes Court Date: 09/23/16 Prior Inpatient Therapy: Prior Inpatient Therapy: Yes Prior Therapy Dates: _0  months ago Prior Therapy Facilty/Provider(s): facility in [place], [place] Reason for Treatment: unclear mental issue Prior Outpatient Therapy: Prior Outpatient Therapy: No Does patient have an ACCT team?: No Does patient have Intensive In-House Services?  : No Does patient have [facility] services? : No (had in the past (2014)) Does patient have P4CC services?: No  Past Medical History:  Past Medical History:  Diagnosis Date  . Homelessness   . Hypercholesterolemia   . Mental disorder   . Paranoid behavior ([place])   . PE (pulmonary embolism)    History reviewed. No pertinent surgical history. Family History:  No family history on file. Family Psychiatric  History: states he has a brother who has history of  Bipolar Disorder  Social History: recently relocated from [place] , living alone , not working , denies  legal issues  History  Alcohol Use  . Yes    Comment: 1 40 oz per day     History  Drug Use  . Types: Marijuana    Social History   Social History  . Marital status: Single    Spouse name: N/A  . Number of children: N/A  . Years of education: N/A   Social History Main Topics  . Smoking status: Current Every Day Smoker    Types: Cigarettes  . Smokeless tobacco: Never Used  . Alcohol use Yes     Comment: 1 40 oz per day  . Drug use:     Types: Marijuana  . Sexual activity: Not Asked   Other Topics Concern  . None   Social History Narrative  . None   Additional Social History:    Allergies:   Allergies  Allergen Reactions  . Haldol [Haloperidol] Shortness Of Breath    Labs:  Results for orders placed or performed during the hospital encounter of 08/26/16 (from the past 48 hour(s))  Ethanol     Status: Abnormal   Collection Time: 08/26/16  3:00 AM  Result Value Ref Range   Alcohol, Ethyl (B) 69 (H) <5 mg/dL    Comment:        LOWEST DETECTABLE LIMIT FOR SERUM ALCOHOL IS 5 mg/dL FOR MEDICAL PURPOSES ONLY   CBC with Differential     Status: Abnormal   Collection Time: 08/26/16  3:00 AM  Result Value Ref Range   WBC 6.9 4.0 - 10.5 K/uL   RBC 4.20 (L) 4.22 - 5.81 MIL/uL   Hemoglobin 11.5 (L) 13.0 - 17.0 g/dL   HCT 35.8 (L) 39.0 - 52.0 %   MCV 85.2 78.0 - 100.0 fL   MCH 27.4 26.0 - 34.0 pg   MCHC 32.1 30.0 - 36.0 g/dL   RDW 13.2 11.5 - 15.5 %   Platelets 386 150 - 400 K/uL   Neutrophils Relative % 57 %   Neutro Abs 3.9 1.7 - 7.7 K/uL   Lymphocytes Relative 27 %   Lymphs Abs 1.9 0.7 - 4.0 K/uL   Monocytes Relative 7 %   Monocytes Absolute 0.5 0.1 - 1.0 K/uL   Eosinophils Relative 9 %   Eosinophils Absolute 0.6 0.0 - 0.7 K/uL   Basophils Relative 0 %   Basophils Absolute 0.0 0.0 - 0.1 K/uL  Comprehensive metabolic panel     Status: Abnormal   Collection Time: 08/26/16  3:00 AM  Result Value Ref Range   Sodium 136 135 - 145 mmol/L   Potassium 3.7  3.5 - 5.1 mmol/L   Chloride 104 101 - 111 mmol/L   CO2 23 22 - 32 mmol/L   Glucose, Bld 96 65 - 99 mg/dL   BUN 8 6 - 20 mg/dL   Creatinine, Ser 1.02 0.61 - 1.24 mg/dL   Calcium 8.5 (L) 8.9 - 10.3 mg/dL   Total Protein 6.8 6.5 - 8.1 g/dL   Albumin 3.5 3.5 - 5.0 g/dL   AST 17 15 - 41 U/L   ALT 24 17 - 63 U/L   Alkaline Phosphatase 44 38 - 126 U/L   Total Bilirubin 0.5 0.3 - 1.2 mg/dL   GFR calc non Af Amer >60 >60 mL/min   GFR  calc Af Amer >60 >60 mL/min    Comment: (NOTE) The eGFR has been calculated using the CKD EPI equation. This calculation has not been validated in all clinical situations. eGFR's persistently <60 mL/min signify possible Chronic Kidney Disease.    Anion gap 9 5 - 15  Rapid urine drug screen (hospital performed)     Status: None   Collection Time: 08/26/16  7:20 AM  Result Value Ref Range   Opiates NONE DETECTED NONE DETECTED   Cocaine NONE DETECTED NONE DETECTED   Benzodiazepines NONE DETECTED NONE DETECTED   Amphetamines NONE DETECTED NONE DETECTED   Tetrahydrocannabinol NONE DETECTED NONE DETECTED   Barbiturates NONE DETECTED NONE DETECTED    Comment:        DRUG SCREEN FOR MEDICAL PURPOSES ONLY.  IF CONFIRMATION IS NEEDED FOR ANY PURPOSE, NOTIFY LAB WITHIN 5 DAYS.        LOWEST DETECTABLE LIMITS FOR URINE DRUG SCREEN Drug Class       Cutoff (ng/mL) Amphetamine      1000 Barbiturate      200 Benzodiazepine   086 Tricyclics       761 Opiates          300 Cocaine          300 THC              50     Current Facility-Administered Medications  Medication Dose Route Frequency Provider Last Rate Last Dose  . acetaminophen (TYLENOL) tablet 650 mg  650 mg Oral Q4H PRN Delos Haring, PA-C      . alum & mag hydroxide-simeth (MAALOX/MYLANTA) 200-200-20 MG/5ML suspension 30 mL  30 mL Oral PRN Delos Haring, PA-C      . ibuprofen (ADVIL,MOTRIN) tablet 600 mg  600 mg Oral Q8H PRN Delos Haring, PA-C      . nicotine (NICODERM CQ - dosed in mg/24 hours)  patch 21 mg  21 mg Transdermal Daily Delos Haring, PA-C   Stopped at 08/26/16 1255  . ondansetron (ZOFRAN) tablet 4 mg  4 mg Oral Q8H PRN Delos Haring, PA-C      . pravastatin (PRAVACHOL) tablet 20 mg  20 mg Oral Daily Delos Haring, PA-C   20 mg at 08/26/16 1112  . risperiDONE (RISPERDAL) tablet 1 mg  1 mg Oral QAC breakfast Delos Haring, PA-C   1 mg at 08/26/16 1113  . risperiDONE (RISPERDAL) tablet 3 mg  3 mg Oral QHS Delos Haring, PA-C      . rivaroxaban (XARELTO) tablet 20 mg  20 mg Oral Q supper Delos Haring, PA-C      . zolpidem (AMBIEN) tablet 5 mg  5 mg Oral QHS PRN Delos Haring, PA-C       Current Outpatient Prescriptions  Medication Sig Dispense Refill  . pravastatin (PRAVACHOL) 20 MG tablet Take 20 mg by mouth daily.    . risperiDONE (RISPERDAL) 3 MG tablet Take 3 mg by mouth at bedtime.    . rivaroxaban (XARELTO) 20 MG TABS tablet Take 20 mg by mouth daily with supper.    . risperiDONE (RISPERDAL) 1 MG tablet Take 1 tablet (1 mg total) by mouth daily before breakfast. (Patient not taking: Reported on 08/26/2016) 30 tablet 0    Musculoskeletal: Strength & Muscle Tone: within normal limits- no tremors , no diaphoresis, no acute restlessness or distress  Gait & Station: normal Patient leans: N/A  Psychiatric Specialty Exam: Physical Exam  ROS no headache, no chest pain, no shortness of breath, no  nausea, no vomiting , denies any active bleeding, no melenas   Blood pressure 143/83, pulse 101, temperature 98.7 F (37.1 C), temperature source Oral, resp. rate 16, weight 225 lb (102.1 kg), SpO2 97 %.Body mass index is 32.28 kg/m.  General Appearance: Fairly Groomed- in hospital garb   Eye Contact:  Good  Speech:  Normal Rate  Volume:  Normal  Mood:  Anxious  Affect:  appears anxious, vaguely suspicious  Thought Process:  Linear  Orientation:  Other:  alert and attentive   Thought Content:  describes hallucinations telling him he is going to be killed, states these  are intermittent, but distressing when they occur, no delusions expressed   Suicidal Thoughts:  No at this time denies suicidal or self injurious ideations   Homicidal Thoughts:  No  Memory:  recent and remote grossly intact   Judgement:  Fair  Insight:  Fair  Psychomotor Activity:  Normal  Concentration:  Concentration: Good and Attention Span: Good  Recall:  [place] of Knowledge:  Good  Language:  Good  Akathisia:  Negative  Handed:  Right  AIMS (if indicated):     Assets:  Desire for Improvement Resilience  ADL's:  Fair   Cognition:  WNL  Sleep:      Assessment- patient is a 30 year old man, poor historian, reports history of Schizophrenia, reports recent onset of intermittent threatening and distressing auditory hallucinations. Presents suspicious, guarded , anxious .Has come to ED three times recently for vague complaints and halls as above .  Of note, denies alcohol abuse, but BAL has been positive on two last ER visits. At this time not presenting with any symptoms of WDL .  Based on his repeated visits to ED and clear level of distress regarding hallucinations, I do think inpatient admission is warranted for stabilization , support .   Treatment Plan Summary: as below  Disposition:  Recommend inpatient psychiatric admission . Continue Risperidone 1 mgr QAM and 3 mgrs QHS for psychotic symptoms Ativan 1 mgrs Q 6 hours PRN anxiety or for symptoms of alcohol WDL    Neita Garnet, MD 08/26/2016 3:44 PM

## 2016-08-26 NOTE — ED Notes (Signed)
Called [facility] advised they will be looking for inpatient treatment.

## 2016-08-27 ENCOUNTER — Encounter (HOSPITAL_COMMUNITY): Payer: Self-pay | Admitting: *Deleted

## 2016-08-27 DIAGNOSIS — R45851 Suicidal ideations: Secondary | ICD-10-CM

## 2016-08-27 DIAGNOSIS — F200 Paranoid schizophrenia: Principal | ICD-10-CM

## 2016-08-27 LAB — LIPID PANEL
CHOL/HDL RATIO: 4.1 ratio
CHOLESTEROL: 160 mg/dL (ref 0–200)
HDL: 39 mg/dL — AB (ref >40)
LDL Cholesterol: 81 mg/dL (ref 0–99)
Triglycerides: 199 mg/dL — ABNORMAL HIGH (ref <150)
VLDL: 40 mg/dL (ref 0–40)

## 2016-08-27 LAB — TSH: TSH: 1.856 u[IU]/mL (ref 0.350–4.500)

## 2016-08-27 MED ORDER — OMEGA-3-ACID ETHYL ESTERS 1 G PO CAPS
1.0000 g | ORAL_CAPSULE | Freq: Two times a day (BID) | ORAL | Status: DC
  Administered 2016-08-27 – 2016-08-30 (×6): 1 g via ORAL
  Filled 2016-08-27 (×2): qty 1
  Filled 2016-08-27: qty 14
  Filled 2016-08-27 (×4): qty 1
  Filled 2016-08-27: qty 14
  Filled 2016-08-27 (×3): qty 1

## 2016-08-27 MED ORDER — BENZTROPINE MESYLATE 0.5 MG PO TABS
0.5000 mg | ORAL_TABLET | Freq: Two times a day (BID) | ORAL | Status: DC
  Administered 2016-08-27 – 2016-08-30 (×6): 0.5 mg via ORAL
  Filled 2016-08-27 (×5): qty 1
  Filled 2016-08-27: qty 14
  Filled 2016-08-27: qty 1
  Filled 2016-08-27: qty 14
  Filled 2016-08-27 (×3): qty 1

## 2016-08-27 MED ORDER — RISPERIDONE 1 MG PO TABS
1.0000 mg | ORAL_TABLET | Freq: Every morning | ORAL | Status: DC
  Administered 2016-08-28 – 2016-08-30 (×3): 1 mg via ORAL
  Filled 2016-08-27: qty 1
  Filled 2016-08-27: qty 7
  Filled 2016-08-27 (×3): qty 1

## 2016-08-27 MED ORDER — RISPERIDONE 3 MG PO TABS
3.0000 mg | ORAL_TABLET | Freq: Every day | ORAL | Status: DC
  Administered 2016-08-27 – 2016-08-29 (×3): 3 mg via ORAL
  Filled 2016-08-27 (×3): qty 1
  Filled 2016-08-27: qty 3
  Filled 2016-08-27: qty 7
  Filled 2016-08-27: qty 1

## 2016-08-27 MED ORDER — RIVAROXABAN 20 MG PO TABS
20.0000 mg | ORAL_TABLET | Freq: Every day | ORAL | Status: DC
  Administered 2016-08-27 – 2016-08-29 (×3): 20 mg via ORAL
  Filled 2016-08-27: qty 1
  Filled 2016-08-27: qty 7
  Filled 2016-08-27 (×3): qty 1

## 2016-08-27 NOTE — H&P (Signed)
Psychiatric Admission Assessment Adult  Patient Identification: Cole Barrett MRN:  [id number] Date of Evaluation:  08/27/2016 Chief Complaint:  Schizoaffective Principal Diagnosis: Paranoid schizophrenia (HCC) Diagnosis:   Patient Active Problem List   Diagnosis Date Noted  . Paranoid schizophrenia (HCC) [F20.0] 08/26/2016  . Pulmonary emboli (HCC) [I26.99] 07/13/2016  . Adjustment disorder with anxiety [F43.22] 03/12/2014  . Pulmonary embolism and infarction (HCC) [I26.99] 01/02/2014  . Acute left flank pain [R10.12] 01/02/2014  . Pulmonary embolism, bilateral (HCC) [I26.99] 01/02/2014   History of Present Illness:Cole Barrett is a 30 y.o. male who presents voluntarily to [facility] due to auditory hallucinations telling him they're going to kill him. Pt indicates he's been hearing these voices for a week. He reports that he has never heard voices before a week ago and he wants them to stop. Pt indicates that he's been hearing the voices on and off in the past week and has come up to the ED every time he's heard them. Pt states that he has been taking Risperdal for @ 2 months and received the rx in [place], [place] after staying in an apparent psychiatric facility up there for 2 weeks. Pt was unclear as to his mental health diagnosis. Pt denies SI, HI, hx of violence, or drug/alcohol abuse.   ON evaluation:Cole Barrett is awake, alert and oriented X4 seen resting in bedroom.  Denies suicidal or homicidal ideation. Patient reports auditory and  visual hallucination (intermittient) Patient doesn't  not appear to be responding to internal stimuli. Patient  reports he hasn't been medication complaint. States his depression 7/10. Patient reports last inpatient was a few months ago.Reports fair appetite and states resting well.Support, encouragement and reassurance was provided.   Associated Signs/Symptoms: Depression Symptoms:  depressed mood, (Hypo) Manic Symptoms:   Distractibility, Hallucinations, Anxiety Symptoms:  Excessive Worry, Psychotic Symptoms:  Hallucinations: Auditory PTSD Symptoms: Avoidance:  Decreased Interest/Participation Total Time spent with patient: 45 minutes  Past Psychiatric History: See Above  Is the patient at risk to self? Yes.    Has the patient been a risk to self in the past 6 months? Yes.    Has the patient been a risk to self within the distant past? Yes.    Is the patient a risk to others? No.  Has the patient been a risk to others in the past 6 months? No.  Has the patient been a risk to others within the distant past? No.   Prior Inpatient Therapy:   Prior Outpatient Therapy:    Alcohol Screening: 1. How often do you have a drink containing alcohol?: 4 or more times a week 2. How many drinks containing alcohol do you have on a typical day when you are drinking?: 1 or 2 3. How often do you have six or more drinks on one occasion?: Never Preliminary Score: 0 9. Have you or someone else been injured as a result of your drinking?: No 10. Has a relative or friend or a doctor or another health worker been concerned about your drinking or suggested you cut down?: No Alcohol Use Disorder Identification Test Final Score (AUDIT): 4 Brief Intervention: AUDIT score less than 7 or less-screening does not suggest unhealthy drinking-brief intervention not indicated Substance Abuse History in the last 12 months:  Yes.   Consequences of Substance Abuse: NA Previous Psychotropic Medications: YES Psychological Evaluations: YES Past Medical History:  Past Medical History:  Diagnosis Date  . Homelessness   . Hypercholesterolemia   . Mental disorder   .  Paranoid behavior (HCC)   . PE (pulmonary embolism)   . Schizophrenia (HCC)    History reviewed. No pertinent surgical history. Family History: History reviewed. No pertinent family history. Family Psychiatric  History: See Above Tobacco Screening: Have you used any form of  tobacco in the last 30 days? (Cigarettes, Smokeless Tobacco, Cigars, and/or Pipes): Yes Tobacco use, Select all that apply: 4 or less cigarettes per day Are you interested in Tobacco Cessation Medications?: No, patient refused Counseled patient on smoking cessation including recognizing danger situations, developing coping skills and basic information about quitting provided: Refused/Declined practical counseling Social History:  History  Alcohol Use  . Yes    Comment: 1 40 oz per day     History  Drug Use  . Types: Marijuana    Comment: 3 to 4 wks ago    Additional Social History:      Pain Medications: Denies Prescriptions: Denies Over the Counter: Denies History of alcohol / drug use?: Yes Longest period of sobriety (when/how long): unknown                    Allergies:   Allergies  Allergen Reactions  . Haldol [Haloperidol] Shortness Of Breath   Lab Results:  Results for orders placed or performed during the hospital encounter of 08/26/16 (from the past 48 hour(s))  Lipid panel     Status: Abnormal   Collection Time: 08/27/16  6:48 AM  Result Value Ref Range   Cholesterol 160 0 - 200 mg/dL   Triglycerides 161 (H) <150 mg/dL   HDL 39 (L) >09 mg/dL   Total CHOL/HDL Ratio 4.1 RATIO   VLDL 40 0 - 40 mg/dL   LDL Cholesterol 81 0 - 99 mg/dL    Comment:        Total Cholesterol/HDL:CHD Risk Coronary Heart Disease Risk Table                     Men   Women  1/2 Average Risk   3.4   3.3  Average Risk       5.0   4.4  2 X Average Risk   9.6   7.1  3 X Average Risk  23.4   11.0        Use the calculated Patient Ratio above and the CHD Risk Table to determine the patient's CHD Risk.        ATP III CLASSIFICATION (LDL):  <100     mg/dL   Optimal  604-540  mg/dL   Near or Above                    Optimal  130-159  mg/dL   Borderline  981-191  mg/dL   High  >478     mg/dL   Very High Performed at [facility]   TSH     Status: None   Collection  Time: 08/27/16  6:48 AM  Result Value Ref Range   TSH 1.856 0.350 - 4.500 uIU/mL    Comment: Performed at [facility]    Blood Alcohol level:  Lab Results  Component Value Date   ETH 69 (H) 08/26/2016   ETH 46 (H) 08/25/2016    Metabolic Disorder Labs:  No results found for: HGBA1C, MPG No results found for: PROLACTIN Lab Results  Component Value Date   CHOL 160 08/27/2016   TRIG 199 (H) 08/27/2016   HDL 39 (L) 08/27/2016   CHOLHDL  4.1 08/27/2016   VLDL 40 08/27/2016   LDLCALC 81 08/27/2016   LDLCALC 124 (H) 03/13/2014    Current Medications: Current Facility-Administered Medications  Medication Dose Route Frequency Provider Last Rate Last Dose  . acetaminophen (TYLENOL) tablet 650 mg  650 mg Oral Q6H PRN Kerry HoughSpencer E Simon, PA-C      . alum & mag hydroxide-simeth (MAALOX/MYLANTA) 200-200-20 MG/5ML suspension 30 mL  30 mL Oral Q4H PRN Kerry HoughSpencer E Simon, PA-C      . benztropine (COGENTIN) tablet 0.5 mg  0.5 mg Oral BID Oneta Rackanika N Shantell Belongia, NP      . OLANZapine zydis (ZYPREXA) disintegrating tablet 5 mg  5 mg Oral Q8H PRN Kerry HoughSpencer E Simon, PA-C   5 mg at 08/26/16 2350   And  . LORazepam (ATIVAN) tablet 1 mg  1 mg Oral PRN Kerry HoughSpencer E Simon, PA-C       And  . ziprasidone (GEODON) injection 20 mg  20 mg Intramuscular PRN Kerry HoughSpencer E Simon, PA-C      . magnesium hydroxide (MILK OF MAGNESIA) suspension 30 mL  30 mL Oral Daily PRN Kerry HoughSpencer E Simon, PA-C      . omega-3 acid ethyl esters (LOVAZA) capsule 1 g  1 g Oral BID Oneta Rackanika N Dao Memmott, NP      . Melene Muller[START ON 08/28/2016] risperiDONE (RISPERDAL) tablet 1 mg  1 mg Oral q morning - 10a Oneta Rackanika N Juandiego Kolenovic, NP      . risperiDONE (RISPERDAL) tablet 3 mg  3 mg Oral QHS Oneta Rackanika N Anguel Delapena, NP      . rivaroxaban (XARELTO) tablet 20 mg  20 mg Oral Q supper Jomarie LongsSaramma Eappen, MD      . traZODone (DESYREL) tablet 50 mg  50 mg Oral QHS,MR X 1 Spencer E Simon, PA-C   50 mg at 08/26/16 2350   PTA Medications: Prescriptions Prior to Admission  Medication  Sig Dispense Refill Last Dose  . pravastatin (PRAVACHOL) 20 MG tablet Take 20 mg by mouth daily.   08/26/2016 at Unknown time  . risperiDONE (RISPERDAL) 1 MG tablet Take 1 tablet (1 mg total) by mouth daily before breakfast. 30 tablet 0 08/26/2016 at Unknown time  . risperiDONE (RISPERDAL) 3 MG tablet Take 3 mg by mouth at bedtime.   08/26/2016 at Unknown time  . rivaroxaban (XARELTO) 20 MG TABS tablet Take 20 mg by mouth daily with supper.   08/26/2016 at Unknown time    Musculoskeletal: Strength & Muscle Tone: within normal limits Gait & Station: normal Patient leans: N/A  Psychiatric Specialty Exam: Physical Exam  Nursing note and vitals reviewed. Constitutional: He is oriented to person, place, and time. He appears well-developed.  HENT:  Head: Normocephalic.  Cardiovascular: Normal rate.   Musculoskeletal: Normal range of motion.  Neurological: He is alert and oriented to person, place, and time.  Psychiatric: He has a normal mood and affect. His behavior is normal.    Review of Systems  Psychiatric/Behavioral: Positive for depression, hallucinations and suicidal ideas. The patient is nervous/anxious and has insomnia.     Blood pressure 122/73, pulse 95, temperature 98.2 F (36.8 C), temperature source Oral, resp. rate 18, height 5\' 11"  (1.803 m), weight 101.2 kg (223 lb).Body mass index is 31.1 kg/m.  General Appearance: Casual  Eye Contact:  Fair  Speech:  Clear and Coherent  Volume:  Normal  Mood:  Depressed  Affect:  Congruent  Thought Process:  Coherent  Orientation:  Full (Time, Place, and Person)  Thought Content:  Hallucinations:  Auditory  Suicidal Thoughts:  Yes.  with intent/plan  Homicidal Thoughts:  No  Memory:  Immediate;   Fair Recent;   Fair Remote;   Fair  Judgement:  Good  Insight:  Good  Psychomotor Activity:  Normal  Concentration:  Concentration: Fair  Recall:  [employer] of Knowledge:  Fair  Language:  Good  Akathisia:  No  Handed:  Right   AIMS (if indicated):     Assets:  Communication Skills Desire for Improvement Resilience Social Support  ADL's:  Intact  Cognition:  WNL  Sleep:       I agree with current treatment plan on 08/27/2016, Patient seen face-to-face for psychiatric evaluation follow-up, chart reviewed and case discussed with the MD Hisada. Reviewed the information documented and agree with the treatment plan.  Treatment Plan Summary: Daily contact with patient to assess and evaluate symptoms and progress in treatment and Medication management  Continue with Cogentin 0.5mg  and Risperdal 1 mg PO QAM and 3 mg PO QHS for mood stabilization. Continue with Trazodone  50 mg for insomnia Will continue to monitor vitals ,medication compliance and treatment side effects while patient is here.  Reviewed labs: Lipid elevated -start omega 3 fish oil PO QD. BAL - 0, UDS - positive opiates  SW to working on disposition.  Patient to participate in therapeutic milieu  Observation Level/Precautions:  15 minute checks  Laboratory:  CBC Chemistry Profile HbAIC UDS- prolactin, A1c, TSh and lipids resulted  Psychotherapy:  Individual and group session  Medications: See above    Consultations:  Psychiatry  Discharge Concerns:  Safety, stabilization, and risk of access to medication and medication stabilization   Estimated LOS: 5-7days  Other:     Physician Treatment Plan for Primary Diagnosis: Paranoid schizophrenia (HCC) Long Term Goal(s): Improvement in symptoms so as ready for discharge  Short Term Goals: Ability to verbalize feelings will improve, Ability to demonstrate self-control will improve and Ability to maintain clinical measurements within normal limits will improve  Physician Treatment Plan for Secondary Diagnosis: Principal Problem:   Paranoid schizophrenia (HCC)  Long Term Goal(s): Improvement in symptoms so as ready for discharge  Short Term Goals: Ability to verbalize feelings will improve, Ability  to disclose and discuss suicidal ideas, Ability to maintain clinical measurements within normal limits will improve and Compliance with prescribed medications will improve  I certify that inpatient services furnished can reasonably be expected to improve the patient's condition.    Oneta Rack, NP 9/30/20173:32 PM

## 2016-08-27 NOTE — Progress Notes (Signed)
Adult Psychoeducational Group Note  Date:  08/27/2016 Time:  10:35 PM  Group Topic/Focus:  Wrap-Up Group:   The focus of this group is to help patients review their daily goal of treatment and discuss progress on daily workbooks.   Participation Level:  Did Not Attend  Additional Comments:  Pt was invited, however did not attend.  Caswell CorwinOwen, Yulonda Wheeling C 08/27/2016, 10:35 PM

## 2016-08-27 NOTE — B[facility] Group Notes (Signed)
Adult Psychoeducational Group Note  Date:  08/27/2016 Time:  0930-1000  Goals Group  Patient invited to attend but did not come to group. 

## 2016-08-27 NOTE — Progress Notes (Signed)
During the assessment pt informed the writer that he's heard voices about "4 times in his whole life". Stated the voices started "last Thurs, and this Mon, Wed, and Thurs". States he lives alone, but is [facility], then "maybe I'll be ready to go home".  Pt also asked the writer if someone could sit outside his room. Stated, "I think somebody is going to get me". Asked if someone could be available until he falls to sleep.  Pt denied SI, and HI. However he does hear voices telling him they're going to kill him. Pt has no other questions or concerns.

## 2016-08-27 NOTE — Plan of Care (Signed)
Problem: Nutritional: Goal: Ability to achieve adequate nutritional intake will improve Outcome: Progressing Patient has attended meals and eaten fairly well.

## 2016-08-27 NOTE — B[facility] Suicide Risk Assessment (Signed)
[facility] Admission Suicide Risk Assessment   Nursing information obtained from:  Patient Demographic factors:  Male, Living alone Current Mental Status:  NA Loss Factors:  NA Historical Factors:  NA Risk Reduction Factors:  NA  Total Time spent with patient: 30 minutes Principal Problem: Paranoid schizophrenia (HCC) Diagnosis:   Patient Active Problem List   Diagnosis Date Noted  . Paranoid schizophrenia (HCC) [F20.0] 08/26/2016  . Pulmonary emboli (HCC) [I26.99] 07/13/2016  . Adjustment disorder with anxiety [F43.22] 03/12/2014  . Pulmonary embolism and infarction (HCC) [I26.99] 01/02/2014  . Acute left flank pain [R10.12] 01/02/2014  . Pulmonary embolism, bilateral (HCC) [I26.99] 01/02/2014   Subjective Data:  Patient states that he presented to the hospital as he had AH of "killing you." He states that he had these voices 4 times in the past, but none since admission. He has been started on risperidone 1 month ago, which he was adherent. He denies any suicide attempt. He uses marijuana. He denies alcohol use.   Continued Clinical Symptoms:  Alcohol Use Disorder Identification Test Final Score (AUDIT): 4 The "Alcohol Use Disorders Identification Test", Guidelines for Use in Primary Care, Second Edition.  World Science writer [facility]). Score between 0-7:  no or low risk or alcohol related problems. Score between 8-15:  moderate risk of alcohol related problems. Score between 16-19:  high risk of alcohol related problems. Score 20 or above:  warrants further diagnostic evaluation for alcohol dependence and treatment.   CLINICAL FACTORS:   Schizophrenia:   Paranoid or undifferentiated type   Musculoskeletal: Strength & Muscle Tone: within normal limits Gait & Station: normal Patient leans: N/A  Psychiatric Specialty Exam: Physical Exam  Nursing note and vitals reviewed. Constitutional: He is oriented to person, place, and time. He appears well-developed and well-nourished.   Neurological: He is alert and oriented to person, place, and time.  No tremors    Review of Systems  Psychiatric/Behavioral: Positive for substance abuse. Negative for depression, hallucinations and suicidal ideas. The patient has insomnia. The patient is not nervous/anxious.   All other systems reviewed and are negative.   Blood pressure 122/73, pulse 95, temperature 98.2 F (36.8 C), temperature source Oral, resp. rate 18, height 5\' 11"  (1.803 m), weight 223 lb (101.2 kg).Body mass index is 31.1 kg/m.  General Appearance: Casual  Eye Contact:  Fair  Speech:  Clear and Coherent  Volume:  Normal  Mood:  "fine"  Affect:  Restricted  Thought Process:  Coherent  Orientation:  Full (Time, Place, and Person)  Thought Content:  Logical  Suicidal Thoughts:  No  Homicidal Thoughts:  No  Memory:  Immediate;   Fair Recent;   Fair Remote;   Fair  Judgement:  Fair  Insight:  Shallow  Psychomotor Activity:  Normal  Concentration:  Concentration: Fair and Attention Span: Fair  Recall:  [employer] of Knowledge:  Fair  Language:  Fair  Akathisia:  No  Handed:  Right  AIMS (if indicated):     Assets:  Communication Skills Desire for Improvement  ADL's:  Intact  Cognition:  WNL  Sleep:         COGNITIVE FEATURES THAT CONTRIBUTE TO RISK:  Closed-mindedness    SUICIDE RISK:   Mild:  Suicidal ideation of limited frequency, intensity, duration, and specificity.  There are no identifiable plans, no associated intent, mild dysphoria and related symptoms, good self-control (both objective and subjective assessment), few other risk factors, and identifiable protective factors, including available and accessible social support.  PLAN OF CARE: Patient will be admitted to inpatient psychiatric unit for stabilization and safety. Will provide and encourage milieu participation. Provide medication management and maked adjustments as needed.  Will follow daily.   I certify that inpatient  services furnished can reasonably be expected to improve the patient's condition.  Neysa Hottereina Sincere Berlanga, MD 08/27/2016, 5:29 PM

## 2016-08-27 NOTE — B[facility] Group Notes (Signed)
Adult Therapy Group Note  Date: 08/27/2016 Time:  11:15AM-12:00PM  Group Topic/Focus: Unhealthy Coping Skills versus Healthy Coping Skills  Building Self Esteem:   The Focus of this group was to assist patients in becoming aware of the differences between healthy and unhealthy coping techniques, as well as how to determine which type they are using.   Reasons for choosing the unhealthy techniques were explored, which helped patients to provide support to each other and to determine that, in fact, they are not alone.  Participation Level:  Minimal  Participation Quality:  Resistant  Affect:  Blunted  Cognitive:  Disorganized  Insight: Lacking  Engagement in Group:  Lacking  Modes of Intervention:  Discussion, Exploration and Support  Additional Comments:  The patient expressed that he did not want to be in group, that he had only come to the room to read a book.  He initially agreed to stay since he was reminded of signing the Treatment Agreement stating he would attend groups, but when asked how he felt about something, he got up and left rather than responding.  Carloyn JaegerMareida J Grossman-Orr 08/27/2016, 12:35 PM

## 2016-08-27 NOTE — Progress Notes (Signed)
Data. Patient denies SI/HI. He continues to report command hallucinations of, "Voices telling me to kill myself, or they will". Patient isolating in his room this shift, though he did go to lunch and dinner. On his self assessment patient reports, "no depression, no hopelessness, no anxiety". His goal for today is: "Get talking out my head".  Action. Emotional support and encouragement offered. Education provided on medication, indications and side effect. Q 15 minute checks done for safety. Response. Safety on the unit maintained through 15 minute checks.  Medications taken as prescribed. Attended groups. Remained calm  through out shift.

## 2016-08-28 LAB — HEMOGLOBIN A1C
HEMOGLOBIN A1C: 5.2 % (ref 4.8–5.6)
Mean Plasma Glucose: 103 mg/dL

## 2016-08-28 MED ORDER — CLONIDINE HCL ER 0.1 MG PO TB12
0.1000 mg | ORAL_TABLET | Freq: Every day | ORAL | Status: DC
  Administered 2016-08-29: 0.1 mg via ORAL
  Filled 2016-08-28 (×3): qty 1
  Filled 2016-08-28: qty 7

## 2016-08-28 NOTE — Progress Notes (Signed)
D: Patient pleasant and cooperative this shift and is noted to remain in the dayroom interacting well with peers in the milieu for the majority of the shift. Pt with complaints of mild anxiety, which was reduced with prn medications. Pt continues to complain of hearing voices and feeling paranoid on assessment, but did not show any signs of responding to internal stimuli. Pt denies suicidal/homicidal ideations currently. A: Encourage staff/peer interaction, medication compliance, and group participation. Administer medications as ordered, maintain Q 15 minute safety checks. R: Pt compliant with HS medications. No distress noted.

## 2016-08-28 NOTE — B[facility] Group Notes (Signed)
[facility] Group Notes:  (Clinical Social Work)  08/28/2016  11:00AM-12:00PM  Summary of Progress/Problems:  The main focus of today's process group was to listen to a variety of genres of music and to identify that different types of music provoke different responses.  The patient then was able to identify personally what was soothing for them, as well as energizing, as well as how patient can personally use this knowledge in sleep habits, with depression, and with other symptoms.  The patient smiled a lot during the various songs playing and could be seen to enjoy the music played.    Type of Therapy:  Music Therapy   Participation Level:  Active  Participation Quality:  Attentive and Sharing  Affect:  Blunted  Cognitive:  Oriented  Insight:  Engaged  Engagement in Therapy:  Engaged  Modes of Intervention:   Activity, Exploration  Ambrose MantleMareida Grossman-Orr, LCSW 08/28/2016

## 2016-08-28 NOTE — BHH Group Notes (Signed)
The focus of this group is to educate the patient on the purpose and policies of crisis stabilization and provide a format to answer questions about their admission.  The group details unit policies and expectations of patients while admitted.  Patient did not attend 0900 nurse education orientation group this morning.  Patient stayed in bed.   

## 2016-08-28 NOTE — Progress Notes (Signed)
[facility]  08/28/2016 10:35 AM Cole ChessChristopher A Barrett  MRN:  [id number] Subjective:  Patient reports ' I feel better than yesterday"  Objective: Cole Barrett is awake, alert and oriented *4. Seen resting in his bedroom. Denies suicidal or homicidal ideation during this assessment. Denies auditory or visual hallucination and does not appear to be responding to internal stimuli.   Patient reports he is medication compliant without mediation side effects. Report learning new coping skills and attending group session. Patient denies depression or depressive symptoms. Reports good appetite and reports resting well throughout the day.  Support, encouragement and reassurance was provided.   Principal Problem: Paranoid schizophrenia (HCC) Diagnosis:   Patient Active Problem List   Diagnosis Date Noted  . Paranoid schizophrenia (HCC) [F20.0] 08/26/2016  . Pulmonary emboli (HCC) [I26.99] 07/13/2016  . Adjustment disorder with anxiety [F43.22] 03/12/2014  . Pulmonary embolism and infarction (HCC) [I26.99] 01/02/2014  . Acute left flank pain [R10.9] 01/02/2014  . Pulmonary embolism, bilateral (HCC) [I26.99] 01/02/2014   Total Time spent with patient: 30 minutes  Past Psychiatric History:  Past Medical History:  Past Medical History:  Diagnosis Date  . Homelessness   . Hypercholesterolemia   . Mental disorder   . Paranoid behavior (HCC)   . PE (pulmonary embolism)   . Schizophrenia (HCC)    History reviewed. No pertinent surgical history. Family History: History reviewed. No pertinent family history. Family Psychiatric  History: see hpi Social History:  History  Alcohol Use  . Yes    Comment: 1 40 oz per day     History  Drug Use  . Types: Marijuana    Comment: 3 to 4 wks ago    Social History   Social History  . Marital status: Single    Spouse name: N/A  . Number of children: N/A  . Years of education: N/A   Social History Main Topics  . Smoking status:  Current Every Day Smoker    Packs/day: 0.25    Years: 4.00    Types: Cigarettes  . Smokeless tobacco: Never Used  . Alcohol use Yes     Comment: 1 40 oz per day  . Drug use:     Types: Marijuana     Comment: 3 to 4 wks ago  . Sexual activity: No   Other Topics Concern  . None   Social History Narrative  . None   Additional Social History:    Pain Medications: Denies Prescriptions: Denies Over the Counter: Denies History of alcohol / drug use?: Yes Longest period of sobriety (when/how long): unknown                    Sleep: Good -patient resting throughout the day  Appetite:  Fair  Current Medications: Current Facility-Administered Medications  Medication Dose Route Frequency Provider Last Rate Last Dose  . acetaminophen (TYLENOL) tablet 650 mg  650 mg Oral Q6H PRN Kerry HoughSpencer E Simon, PA-C      . alum & mag hydroxide-simeth (MAALOX/MYLANTA) 200-200-20 MG/5ML suspension 30 mL  30 mL Oral Q4H PRN Kerry HoughSpencer E Simon, PA-C      . benztropine (COGENTIN) tablet 0.5 mg  0.5 mg Oral BID Oneta Rackanika N Torez Beauregard, NP   0.5 mg at 08/28/16 0824  . OLANZapine zydis (ZYPREXA) disintegrating tablet 5 mg  5 mg Oral Q8H PRN Kerry HoughSpencer E Simon, PA-C   5 mg at 08/27/16 2058   And  . LORazepam (ATIVAN) tablet 1 mg  1 mg  Oral PRN Kerry Hough, PA-C       And  . ziprasidone (GEODON) injection 20 mg  20 mg Intramuscular PRN Kerry Hough, PA-C      . magnesium hydroxide (MILK OF MAGNESIA) suspension 30 mL  30 mL Oral Daily PRN Kerry Hough, PA-C      . omega-3 acid ethyl esters (LOVAZA) capsule 1 g  1 g Oral BID Oneta Rack, NP   1 g at 08/28/16 0824  . risperiDONE (RISPERDAL) tablet 1 mg  1 mg Oral q morning - 10a Oneta Rack, NP   1 mg at 08/28/16 0824  . risperiDONE (RISPERDAL) tablet 3 mg  3 mg Oral QHS Oneta Rack, NP   3 mg at 08/27/16 2059  . rivaroxaban (XARELTO) tablet 20 mg  20 mg Oral Q supper Jomarie Longs, MD   20 mg at 08/27/16 1714  . traZODone (DESYREL) tablet 50 mg  50  mg Oral QHS,MR X 1 Kerry Hough, PA-C   50 mg at 08/27/16 2059    Lab Results:  Results for orders placed or performed during the hospital encounter of 08/26/16 (from the past 48 hour(s))  Lipid panel     Status: Abnormal   Collection Time: 08/27/16  6:48 AM  Result Value Ref Range   Cholesterol 160 0 - 200 mg/dL   Triglycerides 161 (H) <150 mg/dL   HDL 39 (L) >09 mg/dL   Total CHOL/HDL Ratio 4.1 RATIO   VLDL 40 0 - 40 mg/dL   LDL Cholesterol 81 0 - 99 mg/dL    Comment:        Total Cholesterol/HDL:CHD Risk Coronary Heart Disease Risk Table                     Men   Women  1/2 Average Risk   3.4   3.3  Average Risk       5.0   4.4  2 X Average Risk   9.6   7.1  3 X Average Risk  23.4   11.0        Use the calculated Patient Ratio above and the CHD Risk Table to determine the patient's CHD Risk.        ATP III CLASSIFICATION (LDL):  <100     mg/dL   Optimal  604-540  mg/dL   Near or Above                    Optimal  130-159  mg/dL   Borderline  981-191  mg/dL   High  >478     mg/dL   Very High Performed at [facility]   TSH     Status: None   Collection Time: 08/27/16  6:48 AM  Result Value Ref Range   TSH 1.856 0.350 - 4.500 uIU/mL    Comment: Performed at [facility]    Blood Alcohol level:  Lab Results  Component Value Date   ETH 69 (H) 08/26/2016   ETH 46 (H) 08/25/2016    Metabolic Disorder Labs: No results found for: HGBA1C, MPG No results found for: PROLACTIN Lab Results  Component Value Date   CHOL 160 08/27/2016   TRIG 199 (H) 08/27/2016   HDL 39 (L) 08/27/2016   CHOLHDL 4.1 08/27/2016   VLDL 40 08/27/2016   LDLCALC 81 08/27/2016   LDLCALC 124 (H) 03/13/2014    Physical Findings: AIMS: Facial and Oral Movements Muscles  of Facial Expression: None, normal Lips and Perioral Area: None, normal Jaw: None, normal Tongue: None, normal,Extremity Movements Upper (arms, wrists, hands, fingers): None, normal Lower  (legs, knees, ankles, toes): None, normal, Trunk Movements Neck, shoulders, hips: None, normal, Overall Severity Severity of abnormal movements (highest score from questions above): None, normal Incapacitation due to abnormal movements: None, normal Patient's awareness of abnormal movements (rate only patient's report): No Awareness, Dental Status Current problems with teeth and/or dentures?: No Does patient usually wear dentures?: No  CIWA:    COWS:     Musculoskeletal: Strength & Muscle Tone: within normal limits Gait & Station: normal Patient leans: N/A  Psychiatric Specialty Exam: Physical Exam  Nursing note and vitals reviewed. Constitutional: He is oriented to person, place, and time. He appears well-nourished.  Neurological: He is oriented to person, place, and time.  Skin: Skin is warm and dry.  Psychiatric: He has a normal mood and affect. His behavior is normal.    Review of Systems  Psychiatric/Behavioral: Positive for depression and suicidal ideas. The patient is nervous/anxious.     Blood pressure (!) 154/68, pulse (!) 105, temperature 97.5 F (36.4 C), temperature source Oral, resp. rate 16, height 5\' 11"  (1.803 m), weight 101.2 kg (223 lb).Body mass index is 31.1 kg/m.  General Appearance: Casual and Guarded  Eye Contact:  Good  Speech:  Clear and Coherent  Volume:  Normal  Mood:  Depressed  Affect:  Appropriate, Blunt, Depressed and Flat  Thought Process:  Coherent  Orientation:  Full (Time, Place, and Person)  Thought Content:  Hallucinations: None  Suicidal Thoughts:  Yes.  with intent/plan  Homicidal Thoughts:  No  Memory:  Immediate;   Fair Recent;   Fair Remote;   Fair  Judgement:  Poor  Insight:  Fair  Psychomotor Activity:  Restlessness  Concentration:  Concentration: Fair  Recall:  [employer] of Knowledge:  Fair  Language:  Fair  Akathisia:  No  Handed:  Right  AIMS (if indicated):     Assets:  Medical laboratory scientific officer Resilience Social Support  ADL's:  Intact  Cognition:  WNL  Sleep:  Number of Hours: 3.5     I agree with current treatment plan on 08/28/2016, Patient seen face-to-face for psychiatric evaluation follow-up, chart reviewed. Reviewed the information documented and agree with the treatment plan.  Treatment Plan Summary: Daily contact with patient to assess and evaluate symptoms and progress in treatment and Medication management   Continue with Cogentin 0.5mg  and Risperdal 1 mg PO QAM and 3 mg PO QHS for mood stabilization. Continue with Trazodone  50 mg for insomnia Will continue to monitor vitals ,medication compliance and treatment side effects while patient is here.  Reviewed labs: Lipid elevated -start omega 3 fish oil PO QD. BAL - 0, UDS - positive opiates  SW to working on disposition.  Patient to participate in therapeutic milieu  Oneta Rack, NP 08/28/2016, 10:35 AM

## 2016-08-28 NOTE — Progress Notes (Signed)
Patient ID: Cole ChessChristopher A Sellman, male   DOB: 05/08/1986, 30 y.o.   MRN: [id number]    D: Pt has been very paranoid and suspicious on the unit today. When this writer would give the patient his medication he would look at it for about 5 mins before he would take it. At one point on the unit he came up to staff and asked if someone could watch him while he slept because he was worried. Pt refused to fill out his patient self inventory sheet, he reported that he was good. Pt did not attend any groups nor did he engage in treatment. Pt reported being negative SI/HI, no AH/VH noted. A: 15 min checks continued for patient safety. R: Pt safety maintained.

## 2016-08-28 NOTE — Progress Notes (Signed)
Adult Psychoeducational Group Note  Date:  08/28/2016 Time:  8:29 PM  Group Topic/Focus:  Wrap-Up Group:   The focus of this group is to help patients review their daily goal of treatment and discuss progress on daily workbooks.   Participation Level:  Active  Participation Quality:  Appropriate  Affect:  Appropriate  Cognitive:  Alert  Insight: Appropriate  Engagement in Group:  Engaged  Modes of Intervention:  Discussion  Additional Comments:  Patient states,"my day was fine". Patient's goal was to not hear voices. Patient met his goal.  Zuzu Befort L Schae Cando 08/28/2016, 8:29 PM

## 2016-08-29 DIAGNOSIS — F23 Brief psychotic disorder: Secondary | ICD-10-CM

## 2016-08-29 DIAGNOSIS — F1210 Cannabis abuse, uncomplicated: Secondary | ICD-10-CM | POA: Clinically undetermined

## 2016-08-29 LAB — PROLACTIN: PROLACTIN: 34.1 ng/mL — AB (ref 4.0–15.2)

## 2016-08-29 NOTE — Progress Notes (Signed)
DAR NOTE: Patient presents with anxious affect and depressed mood.  Denies pain, auditory and visual hallucinations. Pt stated his auditory hallucinations is getting better and feel like medication are working. Rates depression at 0, hopelessness at 0, and anxiety at 0.  Maintained on routine safety checks.  Medications given as prescribed.  Support and encouragement offered as needed. States goal for today is "working hard to know what I want to do".  Patient observed socializing with peers in the dayroom.  Offered no complaint.

## 2016-08-29 NOTE — [facility] Suicide Risk Assessment (Signed)
[facility] INPATIENT:  Family/Significant Other Suicide Prevention Education  Suicide Prevention Education:  Education Completed; No one has been identified by the patient as the family member/significant other with whom the patient will be residing, and identified as the person(s) who will aid the patient in the event of a mental health crisis (suicidal ideations/suicide attempt).  With written consent from the patient, the family member/significant other has been provided the following suicide prevention education, prior to the and/or following the discharge of the patient.  The suicide prevention education provided includes the following:  Suicide risk factors  Suicide prevention and interventions  National Suicide Hotline telephone number  [facility] assessment telephone number  [facility] Emergency Assistance 911  [facility] and/or Residential Mobile Crisis Unit telephone number  Request made of family/significant other to:  Remove weapons (e.g., guns, rifles, knives), all items previously/currently identified as safety concern.    Remove drugs/medications (over-the-counter, prescriptions, illicit drugs), all items previously/currently identified as a safety concern.  The family member/significant other verbalizes understanding of the suicide prevention education information provided.  The family member/significant other agrees to remove the items of safety concern listed above. The patient did not endorse SI at the time of admission, nor did the patient c/o SI during the stay here.  SPE not required.   Cole RogueRodney B Louiza Barrett 08/29/2016, 10:48 AM

## 2016-08-29 NOTE — [facility] Group Notes (Signed)
Staff ask patient how his day was? Pt stated he day was good and his goal is to make sure voices are out of his head.

## 2016-08-29 NOTE — Progress Notes (Signed)
Recreation Therapy Notes  Date: 08/29/16 Time: 1000 Location: 500 Hall Dayroom  Group Topic: Wellness  Goal Area(s) Addresses:  Patient will define components of whole wellness. Patient will verbalize benefit of whole wellness.  Intervention: Beach ball, chairs  Activity: Keep it Going Volleyball.  Patients were arranged in a circle.  Patients were to hit a volleyball back and forth.  The beach ball could bounce off the ground but it could not roll to a stop.  LRT will count the number times the patients were able to pass the ball before it rolls to a stop on the floor.  Education: Wellness, Discharge Planning.   Education Outcome: Acknowledges education/In group clarification offered/Needs additional education.   Clinical Observations/Feedback: Pt did not attend group.   Cole Barrett, LRT/CTRS         Virgie Kunda A 08/29/2016 12:29 PM 

## 2016-08-29 NOTE — Tx Team (Signed)
Interdisciplinary Treatment and Diagnostic Plan Update  08/29/2016 Time of Session: 10:47 AM  Cole Barrett MRN: [id number]  Principal Diagnosis: Paranoid schizophrenia [facility])  Secondary Diagnoses: Principal Problem:   Paranoid schizophrenia ([place])   Current Medications:  Current Facility-Administered Medications  Medication Dose Route Frequency Provider Last Rate Last Dose  . acetaminophen (TYLENOL) tablet 650 mg  650 mg Oral Q6H PRN Laverle Hobby, PA-C      . alum & mag hydroxide-simeth (MAALOX/MYLANTA) 200-200-20 MG/5ML suspension 30 mL  30 mL Oral Q4H PRN Laverle Hobby, PA-C      . benztropine (COGENTIN) tablet 0.5 mg  0.5 mg Oral BID Derrill Center, NP   0.5 mg at 08/29/16 0753  . cloNIDine HCl (KAPVAY) ER tablet 0.1 mg  0.1 mg Oral QHS Derrill Center, NP      . OLANZapine zydis (ZYPREXA) disintegrating tablet 5 mg  5 mg Oral Q8H PRN Laverle Hobby, PA-C   5 mg at 08/28/16 2105   And  . LORazepam (ATIVAN) tablet 1 mg  1 mg Oral PRN Laverle Hobby, PA-C       And  . ziprasidone (GEODON) injection 20 mg  20 mg Intramuscular PRN Laverle Hobby, PA-C      . magnesium hydroxide (MILK OF MAGNESIA) suspension 30 mL  30 mL Oral Daily PRN Laverle Hobby, PA-C      . omega-3 acid ethyl esters (LOVAZA) capsule 1 g  1 g Oral BID Derrill Center, NP   1 g at 08/29/16 0753  . risperiDONE (RISPERDAL) tablet 1 mg  1 mg Oral q morning - 10a Derrill Center, NP   1 mg at 08/29/16 0753  . risperiDONE (RISPERDAL) tablet 3 mg  3 mg Oral QHS Derrill Center, NP   3 mg at 08/28/16 2105  . rivaroxaban (XARELTO) tablet 20 mg  20 mg Oral Q supper Ursula Alert, MD   20 mg at 08/28/16 1614  . traZODone (DESYREL) tablet 50 mg  50 mg Oral QHS,MR X 1 Laverle Hobby, PA-C   50 mg at 08/28/16 2105    PTA Medications: Prescriptions Prior to Admission  Medication Sig Dispense Refill Last Dose  . pravastatin (PRAVACHOL) 20 MG tablet Take 20 mg by mouth daily.   08/26/2016 at Unknown time  .  risperiDONE (RISPERDAL) 1 MG tablet Take 1 tablet (1 mg total) by mouth daily before breakfast. 30 tablet 0 08/26/2016 at Unknown time  . risperiDONE (RISPERDAL) 3 MG tablet Take 3 mg by mouth at bedtime.   08/26/2016 at Unknown time  . rivaroxaban (XARELTO) 20 MG TABS tablet Take 20 mg by mouth daily with supper.   08/26/2016 at Unknown time    Treatment Modalities: Medication Management, Group therapy, Case management,  1 to 1 session with clinician, Psychoeducation, Recreational therapy.   Physician Treatment Plan for Primary Diagnosis: Paranoid schizophrenia ([place]) Long Term Goal(s): Improvement in symptoms so as ready for discharge  Short Term Goals: Compliance with prescribed medications will improve  Medication Management: Evaluate patient's response, side effects, and tolerance of medication regimen.  Therapeutic Interventions: 1 to 1 sessions, Unit Group sessions and Medication administration.  Evaluation of Outcomes: Adequate for Discharge  Physician Treatment Plan for Secondary Diagnosis: Principal Problem:   Paranoid schizophrenia ([place])   Long Term Goal(s): Improvement in symptoms so as ready for discharge  Short Term Goals: Ability to disclose and discuss suicidal ideas  Medication Management: Evaluate patient's response, side effects, and  tolerance of medication regimen.  Therapeutic Interventions: 1 to 1 sessions, Unit Group sessions and Medication administration.  Evaluation of Outcomes: Adequate for Discharge   RN Treatment Plan for Primary Diagnosis: Paranoid schizophrenia ([place]) Long Term Goal(s): Knowledge of disease and therapeutic regimen to maintain health will improve  Short Term Goals: Ability to verbalize feelings will improve and Ability to identify and develop effective coping behaviors will improve  Medication Management: RN will administer medications as ordered by provider, will assess and evaluate patient's response and provide education to patient  for prescribed medication. RN will report any adverse and/or side effects to prescribing provider.  Therapeutic Interventions: 1 on 1 counseling sessions, Psychoeducation, Medication administration, Evaluate responses to treatment, Monitor vital signs and CBGs as ordered, Perform/monitor CIWA, COWS, AIMS and Fall Risk screenings as ordered, Perform wound care treatments as ordered.  Evaluation of Outcomes: Adequate for Discharge   LCSW Treatment Plan for Primary Diagnosis: Paranoid schizophrenia ([place]) Long Term Goal(s): Safe transition to appropriate next level of care at discharge, Engage patient in therapeutic group addressing interpersonal concerns.  Short Term Goals: Engage patient in aftercare planning with referrals and resources  Therapeutic Interventions: Assess for all discharge needs, 1 to 1 time with Social worker, Explore available resources and support systems, Assess for adequacy in community support network, Educate family and significant other(s) on suicide prevention, Complete Psychosocial Assessment, Interpersonal group therapy.  Evaluation of Outcomes: Met  See below   Progress in Treatment: Attending groups: Yes Participating in groups: Yes Taking medication as prescribed: Yes Toleration medication: Yes, no side effects reported at this time Family/Significant other contact made: No Patient understands diagnosis: Yes AEB asking for help with AH Discussing patient identified problems/goals with staff: Yes Medical problems stabilized or resolved: Yes Denies suicidal/homicidal ideation: Yes Issues/concerns per patient self-inventory: None Other: N/A  New problem(s) identified: None identified at this time.   New Short Term/Long Term Goal(s): None identified at this time.   Discharge Plan or Barriers:  Return home, follow up outpatient  Reason for Continuation of Hospitalization: Hallucinations Medication stabilization   Estimated Length of Stay: [place] d/c  tomorrow  Attendees: Patient: 08/29/2016  10:47 AM  Physician: Ursula Alert, MD 08/29/2016  10:47 AM  Nursing: Hoy Register, RN 08/29/2016  10:47 AM  RN Care Manager: Lars Pinks, RN 08/29/2016  10:47 AM  Social Worker: Ripley Fraise 08/29/2016  10:47 AM  Recreational Therapist: Marjette  08/29/2016  10:47 AM  Other: Norberto Sorenson 08/29/2016  10:47 AM  Other:  08/29/2016  10:47 AM    Scribe for Treatment Team:  Roque Lias LCSW 08/29/2016 10:47 AM

## 2016-08-29 NOTE — Progress Notes (Signed)
Recreation Therapy Notes  INPATIENT RECREATION THERAPY ASSESSMENT  Patient Details Name: Cole ChessChristopher A Barrett MRN: [id number] DOB: 05/10/1986 Today's Date: 08/29/2016  Patient Stressors: Pt could not name any stressors.  Coping Skills:   Pt could not name any coping skills.  Personal Challenges: Pt could not name any personal challenges.  Leisure Interests (2+):  Individual - TV  Awareness of Community Resources:  Yes  Community Resources:  Other (Comment) (Shopping centers)  Current Use: Yes  Patient Strengths:  Nice guy, don't get mad a lot  Patient Identified Areas of Improvement:  "Get better life like I want it to, do things I work about better"  Current Recreation Participation:  Everyday  Patient Goal for Hospitalization:  "Not hear voices"  [place]  [place]idence[place]   Current [place] (including self-harm):  No  Current HI:  No  Consent to Intern Participation: N/A   Caroll RancherMarjette Sondra Blixt, LRT/CTRS   Caroll RancherLindsay, Vernona Peake A 08/29/2016, 12:58 PM

## 2016-08-29 NOTE — Progress Notes (Signed)
[name] MD Progress Note  08/29/2016 1:24 PM [name]  MRN:  [id number] Subjective:  Patient reports ' I feel fine."   Objective: Cole Barrett is a 30 y.o.AA malewho presented voluntarily to [name] due to auditory hallucinations telling him they're going to kill him. Pt indicated he's been hearing these voices for a week .  Patient seen and chart reviewed.Discussed patient with treatment team.  Pt reports today that his voices have subsided and he is tolerating his medications well. Eventhough per review of records in EHR - pt had initially reports having a remote hx of schizophrenia and being on risperidone in the past , pt denies this today and reports he has never heard any AH in his life . Pt reports that he started hearing voices for the first time last week and that confused and scared him since this was all very new to him. Pt reports he has not stressors , he is not anxious or depressed , and he denies any sleep issues. Pt also reports he rarely abused cannabis - may once in 2 months or something like that. Pt denies any new concerns , and per staff he has been compliant on medications. Pt does appear paranoid on and off per staff - will continue to monitor.     Principal Problem: Brief psychotic disorder Diagnosis:   Patient Active Problem List   Diagnosis Date Noted  . 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 Cannabis use disorder, mild, abuse [F12.10] 08/29/2016  . Pulmonary emboli (HCC) [I26.99] 07/13/2016  . Adjustment disorder with anxiety [F43.22] 03/12/2014  . Pulmonary embolism and infarction (HCC) [I26.99] 01/02/2014  . Acute left flank pain [R10.9] 01/02/2014  . Pulmonary embolism, bilateral (HCC) [I26.99] 01/02/2014   Total Time spent with patient: 25 minutes  Past Psychiatric History: Please see H&P.  Past Medical History:  Past Medical History:  Diagnosis Date  . Homelessness   . Hypercholesterolemia   . Mental disorder   . Paranoid  behavior (HCC)   . PE (pulmonary embolism)   . Schizophrenia (HCC)    History reviewed. No pertinent surgical history. Family History:Please see H&P.  Family Psychiatric  History: see hpi Social History: Patient is single , lives in [place] , works at a [employer] . History  Alcohol Use  . Yes    Comment: 1 40 oz per day     History  Drug Use  . Types: Marijuana    Comment: 3 to 4 wks ago    Social History   Social History  . Marital status: Single    Spouse name: N/A  . Number of children: N/A  . Years of education: N/A   Social History Main Topics  . Smoking status: Current Every Day Smoker    Packs/day: 0.25    Years: 4.00    Types: Cigarettes  . Smokeless tobacco: Never Used  . Alcohol use Yes     Comment: 1 40 oz per day  . Drug use:     Types: Marijuana     Comment: 3 to 4 wks ago  . Sexual activity: No   Other Topics Concern  . None   Social History Narrative  . None   Additional Social History:    Pain Medications: Denies Prescriptions: Denies Over the Counter: Denies History of alcohol / drug use?: Yes Longest period of sobriety (when/how long): unknown  Sleep: Fair  Appetite:  Fair  Current Medications: Current Facility-Administered Medications  Medication Dose Route Frequency Provider Last Rate Last Dose  . acetaminophen (TYLENOL) tablet 650 mg  650 mg Oral Q6H PRN Kerry HoughSpencer E Simon, PA-C      . alum & mag hydroxide-simeth (MAALOX/MYLANTA) 200-200-20 MG/5ML suspension 30 mL  30 mL Oral Q4H PRN Kerry HoughSpencer E Simon, PA-C      . benztropine (COGENTIN) tablet 0.5 mg  0.5 mg Oral BID Oneta Rackanika N Lewis, NP   0.5 mg at 08/29/16 0753  . cloNIDine HCl (KAPVAY) ER tablet 0.1 mg  0.1 mg Oral QHS Oneta Rackanika N Lewis, NP      . OLANZapine zydis (ZYPREXA) disintegrating tablet 5 mg  5 mg Oral Q8H PRN Kerry HoughSpencer E Simon, PA-C   5 mg at 08/28/16 2105   And  . LORazepam (ATIVAN) tablet 1 mg  1 mg Oral PRN Kerry HoughSpencer E Simon, PA-C       And  .  ziprasidone (GEODON) injection 20 mg  20 mg Intramuscular PRN Kerry HoughSpencer E Simon, PA-C      . magnesium hydroxide (MILK OF MAGNESIA) suspension 30 mL  30 mL Oral Daily PRN Kerry HoughSpencer E Simon, PA-C      . omega-3 acid ethyl esters (LOVAZA) capsule 1 g  1 g Oral BID Oneta Rackanika N Lewis, NP   1 g at 08/29/16 0753  . risperiDONE (RISPERDAL) tablet 1 mg  1 mg Oral q morning - 10a Oneta Rackanika N Lewis, NP   1 mg at 08/29/16 0753  . risperiDONE (RISPERDAL) tablet 3 mg  3 mg Oral QHS Oneta Rackanika N Lewis, NP   3 mg at 08/28/16 2105  . rivaroxaban (XARELTO) tablet 20 mg  20 mg Oral Q supper Jomarie LongsSaramma Nawaf Strange, MD   20 mg at 08/28/16 1614  . traZODone (DESYREL) tablet 50 mg  50 mg Oral QHS,MR X 1 Kerry HoughSpencer E Simon, PA-C   50 mg at 08/28/16 2105    Lab Results:  No results found for this or any previous visit (from the past 48 hour(s)).  Blood Alcohol level:  Lab Results  Component Value Date   ETH 69 (H) 08/26/2016   ETH 46 (H) 08/25/2016    Metabolic Disorder Labs: Lab Results  Component Value Date   HGBA1C 5.2 08/27/2016   MPG 103 08/27/2016   Lab Results  Component Value Date   PROLACTIN 34.1 (H) 08/27/2016   Lab Results  Component Value Date   CHOL 160 08/27/2016   TRIG 199 (H) 08/27/2016   HDL 39 (L) 08/27/2016   CHOLHDL 4.1 08/27/2016   VLDL 40 08/27/2016   LDLCALC 81 08/27/2016   LDLCALC 124 (H) 03/13/2014    Physical Findings: AIMS: Facial and Oral Movements Muscles of Facial Expression: None, normal Lips and Perioral Area: None, normal Jaw: None, normal Tongue: None, normal,Extremity Movements Upper (arms, wrists, hands, fingers): None, normal Lower (legs, knees, ankles, toes): None, normal, Trunk Movements Neck, shoulders, hips: None, normal, Overall Severity Severity of abnormal movements (highest score from questions above): None, normal Incapacitation due to abnormal movements: None, normal Patient's awareness of abnormal movements (rate only patient's report): No Awareness, Dental  Status Current problems with teeth and/or dentures?: No Does patient usually wear dentures?: No  CIWA:    COWS:     Musculoskeletal: Strength & Muscle Tone: within normal limits Gait & Station: normal Patient leans: N/A  Psychiatric Specialty Exam: Physical Exam  Nursing note and vitals reviewed. Constitutional: He is oriented to person, place,  and time. He appears well-nourished.  Neurological: He is oriented to person, place, and time.  Skin: Skin is warm and dry.  Psychiatric: He has a normal mood and affect. His behavior is normal.    Review of Systems  Psychiatric/Behavioral: Positive for depression and suicidal ideas. The patient is nervous/anxious.     Blood pressure 132/77, pulse (!) 112, temperature 98.2 F (36.8 C), temperature source Oral, resp. rate 18, height 5\' 11"  (1.803 m), weight 101.2 kg (223 lb).Body mass index is 31.1 kg/m.  General Appearance: Casual and Guarded  Eye Contact:  Good  Speech:  Clear and Coherent  Volume:  Normal  Mood:  Anxious  Affect:  Congruent  Thought Process:  Coherent  Orientation:  Full (Time, Place, and Person)  Thought Content:  Paranoid Ideation  Suicidal Thoughts:  No  Homicidal Thoughts:  No  Memory:  Immediate;   Fair Recent;   Fair Remote;   Fair  Judgement:  Poor  Insight:  Fair  Psychomotor Activity:  Restlessness  Concentration:  Concentration: Fair  Recall:  [employer] of Knowledge:  Fair  Language:  Fair  Akathisia:  No  Handed:  Right  AIMS (if indicated):     Assets:  Architect Resilience Social Support  ADL's:  Intact  Cognition:  WNL  Sleep:  Number of Hours: 4      Treatment Plan Summary:Cole Barrett is a 30 y.o.AA malewho presented voluntarily to [facility] due to auditory hallucinations telling him they're going to kill him. Pt indicated he's been hearing these voices for a week .  Patient seen as less anxious , staff reports some paranoia - will  continue to monitor.  Daily contact with patient to assess and evaluate symptoms and progress in treatment and Medication management   Continue with Cogentin 0.5mg  and Risperdal 1 mg PO QAM and 3 mg PO QHS for mood stabilization. Continue with Trazodone  50 mg po qhs  for insomnia Will continue to monitor vitals ,medication compliance and treatment side effects while patient is here.  Reviewed labs: Lipid elevated -start omega 3 fish oil PO QD. BAL - 0, UDS - positive opiates , tsh - wnl . CSW will continue to work on disposition. Patient to participate in therapeutic milieu  Clella Mckeel, MD 08/29/2016, 1:24 PM

## 2016-08-29 NOTE — Progress Notes (Signed)
D: Patient interacting well with peers in the milieu and he participated in the evening wrap-up group. Pt again requesting to have a staff member sit with him as he falls asleep. Pt informed that if he continues to request this due to not feeling safe, he would get a male sitter for safety. Pt then changed his mind stating "Nah, I'm good. I ain't hearing anything or seeing anything now". Pt able to quickly fall asleep without staff at bedside. No distress noted.  A: Encourage staff/peer interaction, medication compliance, and group participation. Administer medications as ordered, maintain Q 15 minute safety checks. R: No signs/symptoms of distress noted.

## 2016-08-29 NOTE — [facility] Group Notes (Signed)
[facility] LCSW Group Therapy  08/29/2016 1:15 pm  Type of Therapy: Process Group Therapy  Participation Level:  Active  Participation Quality:  Appropriate  Affect:  Flat  Cognitive:  Oriented  Insight:  Improving  Engagement in Group:  Limited  Engagement in Therapy:  Limited  Modes of Intervention:  Activity, Clarification, Education, Problem-solving and Support  Summary of Progress/Problems: Today's group addressed the issue of overcoming obstacles.  Patients were asked to identify their biggest obstacle post d/c that stands in the way of their on-going success, and then problem solve as to how to manage this. Stayed the entire time, minimal engagement.  Offered nothing spontaneously, and when asked directly, stated he has no obstacles.  "I sit at home and watch TV. It's all good."  Ida Rogueorth, Teya Otterson B 08/29/2016   4:23 PM

## 2016-08-30 ENCOUNTER — Encounter (HOSPITAL_COMMUNITY): Payer: Self-pay

## 2016-08-30 ENCOUNTER — Emergency Department (HOSPITAL_COMMUNITY)
Admission: EM | Admit: 2016-08-30 | Discharge: 2016-08-30 | Disposition: A | Discharge status: Home / Self Care | Payer: Medicaid Other | Attending: Dermatology | Admitting: Dermatology

## 2016-08-30 DIAGNOSIS — Z79899 Other long term (current) drug therapy: Secondary | ICD-10-CM | POA: Insufficient documentation

## 2016-08-30 DIAGNOSIS — F1290 Cannabis use, unspecified, uncomplicated: Secondary | ICD-10-CM

## 2016-08-30 DIAGNOSIS — Z046 Encounter for general psychiatric examination, requested by authority: Secondary | ICD-10-CM | POA: Diagnosis not present

## 2016-08-30 DIAGNOSIS — F1099 Alcohol use, unspecified with unspecified alcohol-induced disorder: Secondary | ICD-10-CM

## 2016-08-30 DIAGNOSIS — F17210 Nicotine dependence, cigarettes, uncomplicated: Secondary | ICD-10-CM

## 2016-08-30 DIAGNOSIS — F1210 Cannabis abuse, uncomplicated: Secondary | ICD-10-CM

## 2016-08-30 DIAGNOSIS — Z5321 Procedure and treatment not carried out due to patient leaving prior to being seen by health care provider: Secondary | ICD-10-CM | POA: Insufficient documentation

## 2016-08-30 LAB — RAPID URINE DRUG SCREEN, HOSP PERFORMED
AMPHETAMINES: NOT DETECTED
BARBITURATES: NOT DETECTED
BENZODIAZEPINES: NOT DETECTED
COCAINE: NOT DETECTED
Opiates: NOT DETECTED
Tetrahydrocannabinol: POSITIVE — AB

## 2016-08-30 MED ORDER — TRAZODONE HCL 50 MG PO TABS: 50.0000 mg | ORAL_TABLET | Freq: Every evening | ORAL | 0 refills | Status: DC | PRN

## 2016-08-30 MED ORDER — CLONIDINE HCL ER 0.1 MG PO TB12: 0.1000 mg | ORAL_TABLET | Freq: Every day | ORAL | 0 refills | Status: DC

## 2016-08-30 MED ORDER — RISPERIDONE 1 MG PO TABS: 1.0000 mg | ORAL_TABLET | Freq: Every morning | ORAL | 0 refills | Status: DC

## 2016-08-30 MED ORDER — BENZTROPINE MESYLATE 0.5 MG PO TABS: 0.5000 mg | ORAL_TABLET | Freq: Two times a day (BID) | ORAL | 0 refills | Status: DC

## 2016-08-30 MED ORDER — RISPERIDONE 3 MG PO TABS: 3.0000 mg | ORAL_TABLET | Freq: Every day | ORAL | 0 refills | Status: DC

## 2016-08-30 MED ORDER — RIVAROXABAN 20 MG PO TABS: 20.0000 mg | ORAL_TABLET | Freq: Every day | ORAL | 0 refills | Status: DC

## 2016-08-30 NOTE — [facility] Counselor (Signed)
Adult Comprehensive Assessment  Patient ID: Cole ChessChristopher A Buday, male   DOB: 10/06/1986, 30 y.o.   MRN: [id number]  Information Source: Information source: Patient  Current Stressors:  Employment / Job issues: SSI Family Relationships: cites no supports Surveyor, quantityinancial / Lack of resources (include bankruptcy): Fixed income Housing / Lack of housing: has his own place, has been there for a couple of years Physical health (include injuries & life threatening diseases): pulmonary embolism first diagnoses in 2015 Substance abuse: beer, cannabis  Living/Environment/Situation:  Living Arrangements: Alone  Family History:     Childhood History:     Education:     Employment/Work Situation:      Architectinancial Resources:      Alcohol/Substance Abuse:      Social Support System:      Leisure/Recreation:      Strengths/Needs:      Discharge Plan:   Does patient have access to transportation?: Yes Will patient be returning to same living situation after discharge?: Yes Currently receiving community mental health services: No If no, would patient like referral for services when discharged?: Yes (What county?) Medical sales representative([name]) Does patient have financial barriers related to discharge medications?: No  Summary/Recommendations:   Summary and Recommendations (to be completed by the evaluator): Cole Barrett is a 30 YO AA male diagnosed with brief psychotic disorder.  He became anxious when he began hearing voices telling him he was going to die.  He had previously not experienced this.  Cole Barrett has limited support, limited insight and states that he does not get disability, he gets "SSI." He can benefit from crises stabilization, medication managment, therapeutic milieu and referral for services.  Cole Barrett. 08/30/2016

## 2016-08-30 NOTE — Discharge Summary (Signed)
Physician Discharge Summary Note  Patient:  Cole Barrett is an 30 y.o., male MRN:  [id number] DOB:  17-Jan-1986 Patient phone:  [phone] (home)  Patient address:   [address] [place] [address],  Total Time spent with patient: 30 minutes  Date of Admission:  08/26/2016 Date of Discharge: 08/30/2016   Reason for Admission:  Auditory command hallucinations  Principal Problem: Brief psychotic disorder Discharge Diagnoses: Patient Active Problem List   Diagnosis Date Noted  . 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 Cannabis use disorder, mild, abuse [F12.10] 08/29/2016  . Pulmonary emboli (HCC) [I26.99] 07/13/2016  . Adjustment disorder with anxiety [F43.22] 03/12/2014  . Pulmonary embolism and infarction (HCC) [I26.99] 01/02/2014  . Acute left flank pain [R10.9] 01/02/2014  . Pulmonary embolism, bilateral (HCC) [I26.99] 01/02/2014    Past Psychiatric History: see HPI  Past Medical History:  Past Medical History:  Diagnosis Date  . Homelessness   . Hypercholesterolemia   . Mental disorder   . Paranoid behavior (HCC)   . PE (pulmonary embolism)   . Schizophrenia (HCC)    History reviewed. No pertinent surgical history. Family History: History reviewed. No pertinent family history. Family Psychiatric  History: see HPI Social History:  History  Alcohol Use  . Yes    Comment: 1 40 oz per day     History  Drug Use  . Types: Marijuana    Comment: 3 to 4 wks ago    Social History   Social History  . Marital status: Single    Spouse name: N/A  . Number of children: N/A  . Years of education: N/A   Social History Main Topics  . Smoking status: Current Every Day Smoker    Packs/day: 0.25    Years: 4.00    Types: Cigarettes  . Smokeless tobacco: Never Used  . Alcohol use Yes     Comment: 1 40 oz per day  . Drug use:     Types: Marijuana     Comment: 3 to 4 wks ago  . Sexual activity: No   Other Topics Concern  . None   Social History  Narrative  . None    Hospital Course:  Cole Lorusso Johnsonis a 30 y.o.malewho presented voluntarily to [facility] due to auditory hallucinations telling him they're going to kill him. Pt indicated he had been hearing these voices for a week.    Cole Barrett was admitted for Brief psychotic disorder and crisis management.  Patient was treated with medications with their indications listed below in detail under Medication List.  Medical problems were identified and treated as needed.  Home medications were restarted as appropriate.  Improvement was monitored by observation and Cole Barrett daily report of symptom reduction.  Emotional and mental status was monitored by daily self inventory reports completed by Cole Barrett and clinical staff.  Patient reported continued improvement, denied any new concerns.  Patient had been compliant on medications and denied side effects.  Support and encouragement was provided.    Patient encouraged to attend groups to help with recognizing triggers of emotional crises and de-stabilizations.  Patient encouraged to attend group to help identify the positive things in life that would help in dealing with feelings of loss, depression and unhealthy or abusive tendencies.         Cole Barrett was evaluated by the treatment team for stability and plans for continued recovery upon discharge.  Patient was offered further treatment options upon discharge  including Residential, Intensive Outpatient and Outpatient treatment. Patient will follow up with agency listed below for medication management and counseling.  Encouraged patient to maintain satisfactory support network and home environment.  Advised to adhere to medication compliance and outpatient treatment follow up.  Prescriptions provided.       Cole Barrett motivation was an integral factor for scheduling further treatment.  Employment, transportation, bed availability, health  status, family support, and any pending legal issues were also considered during patient's hospital stay.  Upon completion of this admission the patient was both mentally and medically stable for discharge denying suicidal/homicidal ideation, auditory/visual/tactile hallucinations, delusional thoughts and paranoia.      Physical Findings: AIMS: Facial and Oral Movements Muscles of Facial Expression: None, normal Lips and Perioral Area: None, normal Jaw: None, normal Tongue: None, normal,Extremity Movements Upper (arms, wrists, hands, fingers): None, normal Lower (legs, knees, ankles, toes): None, normal, Trunk Movements Neck, shoulders, hips: None, normal, Overall Severity Severity of abnormal movements (highest score from questions above): None, normal Incapacitation due to abnormal movements: None, normal Patient's awareness of abnormal movements (rate only patient's report): No Awareness, Dental Status Current problems with teeth and/or dentures?: No Does patient usually wear dentures?: No  CIWA:    COWS:     Musculoskeletal: Strength & Muscle Tone: within normal limits Gait & Station: normal Patient leans: N/A  Psychiatric Specialty Exam: Physical Exam  Nursing note and vitals reviewed. Psychiatric: He has a normal mood and affect. His speech is normal and behavior is normal. Judgment and thought content normal. Thought content is not paranoid. Cognition and memory are normal. He expresses no homicidal and no suicidal ideation.    Review of Systems  Constitutional: Negative.   HENT: Negative.   Eyes: Negative.   Respiratory: Negative.   Cardiovascular: Negative.   Gastrointestinal: Negative.   Musculoskeletal: Negative.   Skin: Negative.   Neurological: Negative.   Endo/Heme/Allergies: Negative.   Psychiatric/Behavioral: Negative.   All other systems reviewed and are negative.   Blood pressure 127/78, pulse 87, temperature 98.5 F (36.9 C), temperature source Oral,  resp. rate 18, height 5\' 11"  (1.803 m), weight 101.2 kg (223 lb).Body mass index is 31.1 kg/m.   Have you used any form of tobacco in the last 30 days? (Cigarettes, Smokeless Tobacco, Cigars, and/or Pipes): Yes  Has this patient used any form of tobacco in the last 30 days? (Cigarettes, Smokeless Tobacco, Cigars, and/or Pipes) Yes, N/A  Blood Alcohol level:  Lab Results  Component Value Date   ETH 69 (H) 08/26/2016   ETH 46 (H) 08/25/2016    Metabolic Disorder Labs:  Lab Results  Component Value Date   HGBA1C 5.2 08/27/2016   MPG 103 08/27/2016   Lab Results  Component Value Date   PROLACTIN 34.1 (H) 08/27/2016   Lab Results  Component Value Date   CHOL 160 08/27/2016   TRIG 199 (H) 08/27/2016   HDL 39 (L) 08/27/2016   CHOLHDL 4.1 08/27/2016   VLDL 40 08/27/2016   LDLCALC 81 08/27/2016   LDLCALC 124 (H) 03/13/2014    See Psychiatric Specialty Exam and Suicide Risk Assessment completed by Attending Physician prior to discharge.  Discharge destination:  Home  Is patient on multiple antipsychotic therapies at discharge:  No   Has Patient had three or more failed trials of antipsychotic monotherapy by history:  No  Recommended Plan for Multiple Antipsychotic Therapies: NA     Medication List    STOP taking these medications  pravastatin 20 MG tablet Commonly known as:  PRAVACHOL     TAKE these medications     Indication  benztropine 0.5 MG tablet Commonly known as:  COGENTIN Take 1 tablet (0.5 mg total) by mouth 2 (two) times daily.  Indication:  Extrapyramidal Reaction caused by Medications   cloNIDine HCl 0.1 MG Tb12 ER tablet Commonly known as:  KAPVAY Take 1 tablet (0.1 mg total) by mouth at bedtime.  Indication:  Attention Deficit Hyperactivity Disorder   risperiDONE 3 MG tablet Commonly known as:  RISPERDAL Take 1 tablet (3 mg total) by mouth at bedtime. What changed:  medication strength  how much to take  when to take this  Indication:   mood stabilization   risperiDONE 1 MG tablet Commonly known as:  RISPERDAL Take 1 tablet (1 mg total) by mouth every morning. Start taking on:  08/31/2016 What changed:  medication strength  how much to take  when to take this  Indication:  Major Depressive Disorder   rivaroxaban 20 MG Tabs tablet Commonly known as:  XARELTO Take 1 tablet (20 mg total) by mouth daily with supper.  Indication:  Blockage of Blood Vessel to Lung by a Particle   traZODone 50 MG tablet Commonly known as:  DESYREL Take 1 tablet (50 mg total) by mouth at bedtime and may repeat dose one time if needed.  Indication:  Trouble Sleeping      Follow-up Information    [facility] Follow up on 09/05/2016.   Why:  Monday at [facility] for your hospital follow up appointment. Contact information: [address] [place] [phone]          Follow-up recommendations:  Activity:  as tol Diet:  as tol  Comments:  1.  Take all your medications as prescribed.   2.  Report any adverse side effects to outpatient provider. 3.  Patient instructed to not use alcohol or illegal drugs while on prescription medicines. 4.  In the event of worsening symptoms, instructed patient to call 911, the crisis hotline or go to nearest emergency room for evaluation of symptoms.  Signed: Lindwood Qua, NP [facility] 08/30/2016, 12:41 PM

## 2016-08-30 NOTE — ED Triage Notes (Signed)
BIB [facility]. Pt reports that he was picked-up by [facility] and brought here because they saw a hospital wristband on him and assumed he need medical attention. Pt denies SI. Pt denies HI. Pt states he was recently evaluated and d/c'd from behavioral health. Pt A+OX4.

## 2016-08-30 NOTE — Progress Notes (Signed)
Pt has been in the dayroom all evening, dozing off and on.  Pt interacts minimally with his peers.  Writer's conversation was minimal with pt this evening.  He denies SI/HI/AVH this evening. He is med compliant.  He makes his needs known to staff.  He has been polite and appropriate on the unit.  Support and encouragement offered.  Discharge plans are in process.  Pt voices no needs or concerns at this time.  Safety maintained with q15 minute checks.

## 2016-08-30 NOTE — Progress Notes (Signed)
Recreation Therapy Notes  Date: 08/30/16 Time: 1000 Location: [address] Dayroom  Group Topic: Coping Skills  Goal Area(s) Addresses:  Pt will be able to identify positive coping skills. Pt will be able to identify the benefits of positive coping skills. Pt will be able to identify how things will be different if positive coping skills are used post d/c.  Behavioral Response: Drowsy  Intervention: Financial traderWeb worksheet, colored pencils  Activity: OrthoptistWeb Design.  Patients were given a worksheet with a Orthoptistweb design on it.  Patients were to identify the things and situations they are dealing with that have them "stuck".  Patients were to then come up with coping skills they could use to help them get "unstuck" from the circumstances they are facing.  Education: PharmacologistCoping Skills, Building control surveyorDischarge Planning.   Education Outcome: Acknowledges understanding/In group clarification offered/Needs additional education.   Clinical Observations/Feedback: Pt was sleep during group.    Caroll RancherMarjette Sheng Pritz, LRT/CTRS     Caroll RancherLindsay, Crystalyn Delia A 08/30/2016 11:43 AM

## 2016-08-30 NOTE — ED Notes (Signed)
Bed: [facility]3 Expected date:  Expected time:  Means of arrival:  Comments: 

## 2016-08-30 NOTE — ED Notes (Signed)
Pt states that he wanted to leave. Not SI/HI.

## 2016-08-30 NOTE — [facility] Suicide Risk Assessment (Signed)
[facility]   Principal Problem: Brief psychotic disorder Discharge Diagnoses:  Patient Active Problem List   Diagnosis Date Noted  . 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 Cannabis use disorder, mild, abuse [F12.10] 08/29/2016  . Pulmonary emboli (HCC) [I26.99] 07/13/2016  . Adjustment disorder with anxiety [F43.22] 03/12/2014  . Pulmonary embolism and infarction (HCC) [I26.99] 01/02/2014  . Acute left flank pain [R10.9] 01/02/2014  . Pulmonary embolism, bilateral (HCC) [I26.99] 01/02/2014    Total Time spent with patient: 30 minutes  Musculoskeletal: Strength & Muscle Tone: within normal limits Gait & Station: normal Patient leans: N/A  Psychiatric Specialty Exam: Review of Systems  Psychiatric/Behavioral: Positive for substance abuse. Negative for depression and suicidal ideas.  All other systems reviewed and are negative.   Blood pressure 127/78, pulse 87, temperature 98.5 F (36.9 C), temperature source Oral, resp. rate 18, height 5\' 11"  (1.803 m), weight 101.2 kg (223 lb).Body mass index is 31.1 kg/m.  General Appearance: Casual  Eye Contact::  Fair  Speech:  Clear and Coherent409  Volume:  Normal  Mood:  Euthymic  Affect:  Appropriate  Thought Process:  Goal Directed and Descriptions of Associations: Intact  Orientation:  Full (Time, Place, and Person)  Thought Content:  Logical  Suicidal Thoughts:  No  Homicidal Thoughts:  No  Memory:  Immediate;   Fair Recent;   Fair Remote;   Fair  Judgement:  Fair  Insight:  Fair  Psychomotor Activity:  Normal  Concentration:  Fair  Recall:  Fis[employer]Fair  Fund of Knowledge:Fair  Language: Fair  Akathisia:  No  Handed:  Right  AIMS (if indicated):   0  Assets:  Desire for Improvement  Sleep:  Number of Hours: 4.25  Cognition: WNL  ADL's:  Intact   Mental Status Per Nursing Assessment::   On Admission:  NA  Demographic Factors:  Male  Loss Factors: NA  Historical  Factors: Impulsivity  Risk Reduction Factors:   Positive social support  Continued Clinical Symptoms:  Alcohol/Substance Abuse/Dependencies  Cognitive Features That Contribute To Risk:  None    Suicide Risk:  Minimal: No identifiable suicidal ideation.  Patients presenting with no risk factors but with morbid ruminations; may be classified as minimal risk based on the severity of the depressive symptoms  Follow-up Information    [facility] on [facility]/2016.   Why:  Monday at [facility]2PM for your hospital follow up appointment. Contact information: [address]  [place] [phone]          Plan Of Care/Follow-up recommendations:  Activity:  no restrictions Diet:  regular Other:  none  Jaylynn Siefert, MD 08/30/2016, 10:01 AM

## 2016-08-30 NOTE — Progress Notes (Signed)
Patient discharged to lobby. Patient was stable and appreciative at that time. All papers, samples and prescriptions were given and valuables returned. Verbal understanding expressed. Denies SI/HI and A/VH. Patient given opportunity to express concerns and ask questions.  

## 2016-08-30 NOTE — Progress Notes (Signed)
  [facility] :  Will you be returning to the same living situation after discharge:  Yes,  home At discharge, do you have transportation home?: Yes,  bus pass Do you have the ability to pay for your medications: Yes,  MCD  Release of information consent forms completed and in the chart;  Patient's signature needed at discharge.  Patient to Follow up at: Follow-up Information    [facility].   Why:  Monday at [facility]PM for your hospital follow up appointment. Contact information: [address]  [place] [phone]          Next level of care provider has access to [facility] Link:no  Safety Planning and Suicide Prevention discussed: Yes,  yes  Have you used any form of tobacco in the last 30 days? (Cigarettes, Smokeless Tobacco, Cigars, and/or Pipes): Yes  Has patient been referred to the Quitline?: Patient refused referral  Patient has been referred for addiction treatment: N/A  Ida RogueRodney B Jatasia Gundrum 08/30/2016, 9:50 AM

## 2016-08-31 ENCOUNTER — Emergency Department (HOSPITAL_COMMUNITY): Payer: Medicaid Other

## 2016-08-31 ENCOUNTER — Emergency Department (HOSPITAL_COMMUNITY)
Admission: EM | Admit: 2016-08-31 | Discharge: 2016-09-01 | Disposition: A | Discharge status: Home / Self Care | Payer: Medicaid Other | Attending: Emergency Medicine | Admitting: Emergency Medicine

## 2016-08-31 ENCOUNTER — Encounter (HOSPITAL_COMMUNITY): Payer: Self-pay | Admitting: Emergency Medicine

## 2016-08-31 DIAGNOSIS — Z79899 Other long term (current) drug therapy: Secondary | ICD-10-CM | POA: Diagnosis not present

## 2016-08-31 DIAGNOSIS — Z7901 Long term (current) use of anticoagulants: Secondary | ICD-10-CM | POA: Insufficient documentation

## 2016-08-31 DIAGNOSIS — R51 Headache: Secondary | ICD-10-CM | POA: Insufficient documentation

## 2016-08-31 DIAGNOSIS — F17210 Nicotine dependence, cigarettes, uncomplicated: Secondary | ICD-10-CM | POA: Diagnosis not present

## 2016-08-31 DIAGNOSIS — R519 Headache, unspecified: Secondary | ICD-10-CM

## 2016-08-31 NOTE — ED Triage Notes (Signed)
Patient here with headache for the last hour.  Patient has a slight cough with the headache.  He denies any nausea or vomiting.

## 2016-09-01 MED ORDER — APAP 325 MG PO TABS: 650.0000 mg | ORAL_TABLET | ORAL | 0 refills | Status: DC | PRN

## 2016-09-01 MED ORDER — SODIUM CHLORIDE 0.9 % IV SOLN
1000.0000 mL | INTRAVENOUS | Status: DC
  Administered 2016-09-01: 1000 mL via INTRAVENOUS

## 2016-09-01 MED ORDER — KETOROLAC TROMETHAMINE 30 MG/ML IJ SOLN
30.0000 mg | Freq: Once | INTRAMUSCULAR | Status: AC
  Administered 2016-09-01: 30 mg via INTRAVENOUS
  Filled 2016-09-01: qty 1

## 2016-09-01 MED ORDER — METOCLOPRAMIDE HCL 5 MG/ML IJ SOLN
10.0000 mg | Freq: Once | INTRAMUSCULAR | Status: AC
  Administered 2016-09-01: 10 mg via INTRAVENOUS
  Filled 2016-09-01: qty 2

## 2016-09-01 MED ORDER — DIPHENHYDRAMINE HCL 50 MG/ML IJ SOLN
25.0000 mg | Freq: Once | INTRAMUSCULAR | Status: AC
  Administered 2016-09-01: 25 mg via INTRAVENOUS
  Filled 2016-09-01: qty 1

## 2016-09-01 MED ORDER — SODIUM CHLORIDE 0.9 % IV SOLN
1000.0000 mL | Freq: Once | INTRAVENOUS | Status: AC
  Administered 2016-09-01: 1000 mL via INTRAVENOUS

## 2016-09-01 NOTE — ED Notes (Signed)
MD sts okay for pt to eat/drink. Provided him with Malawiturkey sandwich and ginger ale.

## 2016-09-01 NOTE — ED Provider Notes (Signed)
[facility]-EMERGENCY DEPT Provider Note   CSN: [id number] Arrival date & time: 08/31/16  2227  By signing my name below, I, Linna Darner, attest that this documentation has been prepared under the direction and in the presence of physician practitioner, [name], MD. Electronically Signed: Linna Darner, Scribe. 09/01/2016. 1:36 AM.  History   Chief Complaint Chief Complaint  Patient presents with  . Migraine    The history is provided by the patient. No language interpreter was used.     HPI Comments: Cole Barrett is a 30 y.o. male who presents to the Emergency Department complaining of sudden onset, constant, gradually improving, 4/10, headache beginning yesterday morning. Pt reports his headache is located at the top of his scalp. He reports associated dizziness and cough. Pt reports his headache was initially 7/10 in intensity. No modifying factors noted. He denies nausea, blurry vision, or any other associated symptoms.  Past Medical History:  Diagnosis Date  . Homelessness   . Hypercholesterolemia   . Mental disorder   . Paranoid behavior (HCC)   . PE (pulmonary embolism)   . Schizophrenia [facility])     Patient Active Problem List   Diagnosis Date Noted  . 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 Cannabis use disorder, mild, abuse 08/29/2016  . Pulmonary emboli (HCC) 07/13/2016  . Adjustment disorder with anxiety 03/12/2014  . 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 Acute left flank pain 01/02/2014  . Pulmonary embolism, bilateral (HCC) 01/02/2014    History reviewed. No pertinent surgical history.     Home Medications    Prior to Admission medications   Medication Sig Start Date End Date Taking? Authorizing Provider  benztropine (COGENTIN) 0.5 MG tablet Take 1 tablet (0.5 mg total) by mouth 2 (two) times daily. 08/30/16   Adonis Brook, NP  cloNIDine HCl (KAPVAY) 0.1 MG TB12 ER tablet Take 1 tablet (0.1 mg total) by mouth at bedtime. 08/30/16    Adonis Brook, NP  risperiDONE (RISPERDAL) 1 MG tablet Take 1 tablet (1 mg total) by mouth every morning. 08/31/16   Adonis Brook, NP  risperiDONE (RISPERDAL) 3 MG tablet Take 1 tablet (3 mg total) by mouth at bedtime. 08/30/16   Adonis Brook, NP  rivaroxaban (XARELTO) 20 MG TABS tablet Take 1 tablet (20 mg total) by mouth daily with supper. 08/30/16   Adonis Brook, NP  traZODone (DESYREL) 50 MG tablet Take 1 tablet (50 mg total) by mouth at bedtime and may repeat dose one time if needed. 08/30/16   Adonis Brook, NP    Family History No family history on file.  Social History Social History  Substance Use Topics  . Smoking status: Current Every Day Smoker    Packs/day: 0.25    Years: 4.00    Types: Cigarettes  . Smokeless tobacco: Never Used  . Alcohol use Yes     Comment: 1 40 oz per day     Allergies   Haldol [haloperidol]   Review of Systems Review of Systems  Eyes: Negative for visual disturbance.  Respiratory: Positive for cough.   Gastrointestinal: Negative for nausea.  Neurological: Positive for dizziness and headaches.  All other systems reviewed and are negative.   Physical Exam Updated Vital Signs BP 122/84 (BP Location: Right Arm)   Pulse 108   Temp 98.2 F (36.8 C) (Oral)   Resp 20   SpO2 99%   Physical Exam  Constitutional: He is oriented to person, place, and time. He appears well-developed and well-nourished.  HENT:  Head: Normocephalic and atraumatic.  No tenderness to temporalis muscles or paracervical muscles.  Eyes: EOM are normal. Pupils are equal, round, and reactive to light.  Neck: Normal range of motion. Neck supple. No JVD present.  Cardiovascular: Normal rate, regular rhythm and normal heart sounds.   No murmur heard. Pulmonary/Chest: Effort normal and breath sounds normal. He has no wheezes. He has no rales. He exhibits no tenderness.  Abdominal: Soft. Bowel sounds are normal. He exhibits no distension and no mass. There is no  tenderness.  Musculoskeletal: Normal range of motion. He exhibits no edema.  Lymphadenopathy:    He has no cervical adenopathy.  Neurological: He is alert and oriented to person, place, and time. No cranial nerve deficit. He exhibits normal muscle tone. Coordination normal.  Skin: Skin is warm and dry. No rash noted.  Psychiatric: He has a normal mood and affect. His behavior is normal. Judgment and thought content normal.  Nursing note and vitals reviewed.   ED Treatments / Results   Radiology Dg Chest 2 View  Result Date: 09/01/2016 CLINICAL DATA:  Cough.  History of pulmonary embolism. EXAM: CHEST  2 VIEW COMPARISON:  CT chest August 25, 2016 and chest radiograph August 22, 2016 FINDINGS: Small LEFT pleural effusion and LEFT lung base strandy densities, improved from prior radiograph. RIGHT lung is clear. Cardiomediastinal silhouette is normal for this low inspiratory examination. No pneumothorax. Soft tissue planes and included osseous structures are nonsuspicious. IMPRESSION: Small residual LEFT pleural effusion and atelectasis/scarring. Electronically Signed   By: Awilda Metroourtnay  Bloomer M.D.   On: 09/01/2016 01:30    Procedures Procedures (including critical care time)  DIAGNOSTIC STUDIES: Oxygen Saturation is 99% on RA, normal by my interpretation.    COORDINATION OF CARE: 1:40 AM Discussed treatment plan with pt at bedside and pt agreed to plan.  Medications Ordered in ED Medications  0.9 %  sodium chloride infusion (not administered)    Followed by  0.9 %  sodium chloride infusion (not administered)  ketorolac (TORADOL) 30 MG/ML injection 30 mg (not administered)  metoCLOPramide (REGLAN) injection 10 mg (not administered)  diphenhydrAMINE (BENADRYL) injection 25 mg (not administered)     Initial Impression / Assessment and Plan / ED Course  I have reviewed the triage vital signs and the nursing notes.  Pertinent labs & imaging results that were available during my  care of the patient were reviewed by me and considered in my medical decision making (see chart for details).  Clinical Course   Headache which is nonspecific. No red flags to suggest serious causes of headache. Neurologic exam is normal. Old records are reviewed, and he has no relevant past visits. He is given a headache cocktail of normal saline, metoclopramide, diphenhydramine, ketorolac and headache was completely resolved following this. He apparently had no analgesics to use as an outpatient. He is given a prescription for acetaminophen.  I personally performed the services described in this documentation, which was scribed in my presence. The recorded information has been reviewed and is accurate.     Final Clinical Impressions(s) / ED Diagnoses   Final diagnoses:  Acute nonintractable headache, unspecified headache type    New Prescriptions New Prescriptions   ACETAMINOPHEN 325 MG TABLET    Take 2 tablets (650 mg total) by mouth every 4 (four) hours as needed.     Dione Boozeavid Rubyann Lingle, MD 09/01/16 [phone]

## 2016-09-02 ENCOUNTER — Encounter (HOSPITAL_COMMUNITY): Payer: Self-pay

## 2016-09-02 ENCOUNTER — Emergency Department (HOSPITAL_COMMUNITY)
Admission: EM | Admit: 2016-09-02 | Discharge: 2016-09-03 | Disposition: A | Discharge status: Home / Self Care | Payer: Medicaid Other | Attending: Emergency Medicine | Admitting: Emergency Medicine

## 2016-09-02 DIAGNOSIS — Z79899 Other long term (current) drug therapy: Secondary | ICD-10-CM | POA: Insufficient documentation

## 2016-09-02 DIAGNOSIS — Z765 Malingerer [conscious simulation]: Secondary | ICD-10-CM

## 2016-09-02 DIAGNOSIS — F1290 Cannabis use, unspecified, uncomplicated: Secondary | ICD-10-CM | POA: Diagnosis not present

## 2016-09-02 DIAGNOSIS — F4322 Adjustment disorder with anxiety: Secondary | ICD-10-CM

## 2016-09-02 DIAGNOSIS — Z5900 Homelessness unspecified: Secondary | ICD-10-CM

## 2016-09-02 DIAGNOSIS — F4329 Adjustment disorder with other symptoms: Secondary | ICD-10-CM | POA: Diagnosis present

## 2016-09-02 DIAGNOSIS — F17210 Nicotine dependence, cigarettes, uncomplicated: Secondary | ICD-10-CM | POA: Diagnosis not present

## 2016-09-02 DIAGNOSIS — R440 Auditory hallucinations: Secondary | ICD-10-CM

## 2016-09-02 LAB — COMPREHENSIVE METABOLIC PANEL
ALT: 36 U/L (ref 17–63)
AST: 25 U/L (ref 15–41)
Albumin: 3.7 g/dL (ref 3.5–5.0)
Alkaline Phosphatase: 45 U/L (ref 38–126)
Anion gap: 6 (ref 5–15)
BILIRUBIN TOTAL: 0.9 mg/dL (ref 0.3–1.2)
BUN: 14 mg/dL (ref 6–20)
CO2: 27 mmol/L (ref 22–32)
CREATININE: 1.02 mg/dL (ref 0.61–1.24)
Calcium: 8.6 mg/dL — ABNORMAL LOW (ref 8.9–10.3)
Chloride: 104 mmol/L (ref 101–111)
GFR calc Af Amer: >60 mL/min (ref >60)
Glucose, Bld: 90 mg/dL (ref 65–99)
Potassium: 4.1 mmol/L (ref 3.5–5.1)
Sodium: 137 mmol/L (ref 135–145)
TOTAL PROTEIN: 7.5 g/dL (ref 6.5–8.1)

## 2016-09-02 LAB — CBC
HCT: 34.4 % — ABNORMAL LOW (ref 39.0–52.0)
Hemoglobin: 11.2 g/dL — ABNORMAL LOW (ref 13.0–17.0)
MCH: 27.5 pg (ref 26.0–34.0)
MCHC: 32.6 g/dL (ref 30.0–36.0)
MCV: 84.5 fL (ref 78.0–100.0)
PLATELETS: 335 10*3/uL (ref 150–400)
RBC: 4.07 MIL/uL — ABNORMAL LOW (ref 4.22–5.81)
RDW: 13.6 % (ref 11.5–15.5)
WBC: 6 10*3/uL (ref 4.0–10.5)

## 2016-09-02 LAB — RAPID URINE DRUG SCREEN, HOSP PERFORMED
Amphetamines: NOT DETECTED
Barbiturates: NOT DETECTED
Benzodiazepines: NOT DETECTED
Cocaine: NOT DETECTED
Opiates: NOT DETECTED
Tetrahydrocannabinol: NOT DETECTED

## 2016-09-02 LAB — ETHANOL

## 2016-09-02 LAB — ACETAMINOPHEN LEVEL: Acetaminophen (Tylenol), Serum: <10 ug/mL — ABNORMAL LOW (ref 10–30)

## 2016-09-02 LAB — SALICYLATE LEVEL: Salicylate Lvl: <7 mg/dL (ref 2.8–30.0)

## 2016-09-02 MED ORDER — TRAZODONE HCL 50 MG PO TABS
50.0000 mg | ORAL_TABLET | Freq: Every evening | ORAL | Status: DC | PRN
  Administered 2016-09-02: 50 mg via ORAL
  Filled 2016-09-02: qty 1

## 2016-09-02 MED ORDER — RISPERIDONE 1 MG PO TABS: 1.0000 mg | ORAL_TABLET | Freq: Every morning | ORAL | Status: DC

## 2016-09-02 MED ORDER — RISPERIDONE 2 MG PO TABS
3.0000 mg | ORAL_TABLET | Freq: Every day | ORAL | Status: DC
  Administered 2016-09-02: 3 mg via ORAL
  Filled 2016-09-02: qty 1

## 2016-09-02 MED ORDER — BENZTROPINE MESYLATE 0.5 MG PO TABS
0.5000 mg | ORAL_TABLET | Freq: Two times a day (BID) | ORAL | Status: DC
  Administered 2016-09-02 – 2016-09-03 (×2): 0.5 mg via ORAL
  Filled 2016-09-02 (×2): qty 1

## 2016-09-02 MED ORDER — PRAVASTATIN SODIUM 20 MG PO TABS
20.0000 mg | ORAL_TABLET | Freq: Every day | ORAL | Status: DC
  Administered 2016-09-02: 20 mg via ORAL
  Filled 2016-09-02: qty 1

## 2016-09-02 MED ORDER — CLONIDINE HCL ER 0.1 MG PO TB12
0.1000 mg | ORAL_TABLET | Freq: Every day | ORAL | Status: DC
  Administered 2016-09-02: 0.1 mg via ORAL
  Filled 2016-09-02 (×2): qty 1

## 2016-09-02 NOTE — ED Notes (Signed)
Bed: WTR6 Expected date: 09/02/16 Expected time: 11:33 AM Means of arrival:  Comments:

## 2016-09-02 NOTE — ED Notes (Signed)
Patient noted in room. No complaints, stable, in no acute distress. Q15 minute rounds and monitoring via [employer] Cam[employer] to continue.  

## 2016-09-02 NOTE — ED Triage Notes (Signed)
Pt presents to ED with auditory hallucinations that began earlier today. Pt states the voices are "telling me to kill myself". [facility] and "just needs to see a doctor to get my head right". Pt denies SI/HI at this time.

## 2016-09-02 NOTE — ED Provider Notes (Signed)
[facility]-EMERGENCY DEPT Provider Note   CSN: [id number] Arrival date & time: 09/02/16  1339     History   Chief Complaint Chief Complaint  Patient presents with  . Hallucinations    auditory     HPI Cole Barrett is a 30 y.o. male.  The history is provided by the patient and medical records. No language interpreter was used.   Cole Barrett is a 30 y.o. male  with a PMH ofschizophrenia who presents to the Emergency Department complaining of auditory hallucinations 2 weeks. Patient states that he is hearing voices telling him to hurt himself. He states that he does not want to hurt himself, but he cannot get these voices stop. He states that he needs to keep speaking with the doctor until he feels much better. Denies homicidal thoughts. Denies visual hallucinations. He states that he is taking all of his medications as directed. No other complaints at this time.    Past Medical History:  Diagnosis Date  . Homelessness   . Hypercholesterolemia   . Mental disorder   . Paranoid behavior (HCC)   . PE (pulmonary embolism)   . Schizophrenia [facility])     Patient Active Problem List   Diagnosis Date Noted  . 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 Cannabis use disorder, mild, abuse 08/29/2016  . Pulmonary emboli (HCC) 07/13/2016  . Adjustment disorder with anxiety 03/12/2014  . 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 Acute left flank pain 01/02/2014  . Pulmonary embolism, bilateral (HCC) 01/02/2014    History reviewed. No pertinent surgical history.     Home Medications    Prior to Admission medications   Medication Sig Start Date End Date Taking? Authorizing Provider  pravastatin (PRAVACHOL) 20 MG tablet Take 20 mg by mouth at bedtime.   Yes Historical Provider, MD  risperiDONE (RISPERDAL) 3 MG tablet Take 1 tablet (3 mg total) by mouth at bedtime. 08/30/16  Yes Adonis Brook, NP  Rivaroxaban (XARELTO) 15 MG TABS tablet Take 15 mg by mouth 2  (two) times daily.    Yes Historical Provider, MD  acetaminophen 325 MG tablet Take 2 tablets (650 mg total) by mouth every 4 (four) hours as needed. 09/01/16   [name], MD  benztropine (COGENTIN) 0.5 MG tablet Take 1 tablet (0.5 mg total) by mouth 2 (two) times daily. 08/30/16   Adonis Brook, NP  cloNIDine HCl (KAPVAY) 0.1 MG TB12 ER tablet Take 1 tablet (0.1 mg total) by mouth at bedtime. 08/30/16   Adonis Brook, NP  risperiDONE (RISPERDAL) 1 MG tablet Take 1 tablet (1 mg total) by mouth every morning. 08/31/16   Adonis Brook, NP  rivaroxaban (XARELTO) 20 MG TABS tablet Take 1 tablet (20 mg total) by mouth daily with supper. 08/30/16   Adonis Brook, NP  traZODone (DESYREL) 50 MG tablet Take 1 tablet (50 mg total) by mouth at bedtime and may repeat dose one time if needed. 08/30/16   Adonis Brook, NP    Family History No family history on file.  Social History Social History  Substance Use Topics  . Smoking status: Current Every Day Smoker    Packs/day: 0.25    Years: 4.00    Types: Cigarettes  . Smokeless tobacco: Never Used  . Alcohol use Yes     Comment: 1 40 oz per day     Allergies   Haldol [haloperidol]   Review of Systems Review of Systems  Constitutional: Negative for fever.  HENT: Negative for congestion.  Eyes: Negative for visual disturbance.  Respiratory: Negative for shortness of breath.   Cardiovascular: Negative for chest pain.  Gastrointestinal: Negative for abdominal pain.  Musculoskeletal: Negative for back pain.  Skin: Negative for rash.  Neurological: Negative for headaches.  Psychiatric/Behavioral: Positive for hallucinations.     Physical Exam Updated Vital Signs Pulse 93   Temp 98 F (36.7 C) (Oral)   Resp 15   Ht 6' (1.829 m)   Wt 101.2 kg   SpO2 100%   BMI 30.24 kg/m   Physical Exam  Constitutional: He is oriented to person, place, and time. He appears well-developed and well-nourished. No distress.  HENT:  Head:  Normocephalic and atraumatic.  Cardiovascular: Normal rate, regular rhythm and normal heart sounds.   Pulmonary/Chest: Effort normal and breath sounds normal. No respiratory distress. He has no rales.  Abdominal: Soft. He exhibits no distension. There is no tenderness.  Musculoskeletal: Normal range of motion.  Neurological: He is alert and oriented to person, place, and time.  Skin: Skin is warm and dry.  Nursing note and vitals reviewed.    ED Treatments / Results  Labs (all labs ordered are listed, but only abnormal results are displayed) Labs Reviewed  COMPREHENSIVE METABOLIC PANEL - Abnormal; Notable for the following:       Result Value   Calcium 8.6 (*)    All other components within normal limits  ACETAMINOPHEN LEVEL - Abnormal; Notable for the following:    Acetaminophen (Tylenol), Serum <10 (*)    All other components within normal limits  CBC - Abnormal; Notable for the following:    RBC 4.07 (*)    Hemoglobin 11.2 (*)    HCT 34.4 (*)    All other components within normal limits  ETHANOL  SALICYLATE LEVEL  URINE RAPID DRUG SCREEN, HOSP PERFORMED    EKG  EKG Interpretation None       Radiology Dg Chest 2 View  Result Date: 09/01/2016 CLINICAL DATA:  Cough.  History of pulmonary embolism. EXAM: CHEST  2 VIEW COMPARISON:  CT chest August 25, 2016 and chest radiograph August 22, 2016 FINDINGS: Small LEFT pleural effusion and LEFT lung base strandy densities, improved from prior radiograph. RIGHT lung is clear. Cardiomediastinal silhouette is normal for this low inspiratory examination. No pneumothorax. Soft tissue planes and included osseous structures are nonsuspicious. IMPRESSION: Small residual LEFT pleural effusion and atelectasis/scarring. Electronically Signed   By: Awilda Metro M.D.   On: 09/01/2016 01:30    Procedures Procedures (including critical care time)  Medications Ordered in ED Medications  benztropine (COGENTIN) tablet 0.5 mg (not  administered)  cloNIDine HCl (KAPVAY) ER tablet 0.1 mg (not administered)  pravastatin (PRAVACHOL) tablet 20 mg (not administered)  risperiDONE (RISPERDAL) tablet 1 mg (not administered)  risperiDONE (RISPERDAL) tablet 3 mg (not administered)  traZODone (DESYREL) tablet 50 mg (not administered)     Initial Impression / Assessment and Plan / ED Course  I have reviewed the triage vital signs and the nursing notes.  Pertinent labs & imaging results that were available during my care of the patient were reviewed by me and considered in my medical decision making (see chart for details).  Clinical Course   Cole Barrett presents to ED for auditory hallucinations 2 weeks. Labs reviewed and reassuring. Disposition per TTS.  Final Clinical Impressions(s) / ED Diagnoses   Final diagnoses:  None    New Prescriptions New Prescriptions   No medications on file     [facility]  Cole Conant, PA-C 09/02/16 1605    Cole Spatesachel Morgan Little, MD 09/04/16 [phone]

## 2016-09-02 NOTE — ED Notes (Signed)
Pt wanded prior to transfer and [place].

## 2016-09-02 NOTE — BH Assessment (Addendum)
Assessment Note  Cole Barrett is an 30 y.o. male that presents this date with AH. Patient was asleep and was not able to be assessed. Per note review, patient has multiple admission with last one on 08/26/16. Information for assessment was gathered from admission notes: "Patient has a history of schizophrenia who presents to the Emergency Department complaining of auditory hallucinations 2 weeks. Patient states that he is hearing voices telling him to hurt himself. He states that he does not want to hurt himself, but he cannot get these voices stop. He states that he needs to keep speaking with the doctor until he feels much better. Denies homicidal thoughts. Denies visual hallucinations. He states that he is taking all of his medications as directed. No other complaints at this time. Case was staffed with Shaune PollackLord DNP who recommended patient be re-evaluated in the a.m   Diagnosis: Schizophrenia (per notes)  Past Medical History:  Past Medical History:  Diagnosis Date  . Homelessness   . Hypercholesterolemia   . Mental disorder   . Paranoid behavior (HCC)   . PE (pulmonary embolism)   . Schizophrenia (HCC)     History reviewed. No pertinent surgical history.  Family History: No family history on file.  Social History:  reports that he has been smoking Cigarettes.  He has a 1.00 pack-year smoking history. He has never used smokeless tobacco. He reports that he drinks alcohol. He reports that he uses drugs, including Marijuana.  Additional Social History:  Alcohol / Drug Use Pain Medications: Denies Prescriptions: Denies Over the Counter: Denies History of alcohol / drug use?: Yes (UTA this date) Longest period of sobriety (when/how long): unknown Negative Consequences of Use:  (UTA) Withdrawal Symptoms:  (UTA)  CIWA: CIWA-Ar BP: 133/97 Pulse Rate: 82 COWS:    Allergies:  Allergies  Allergen Reactions  . Haldol [Haloperidol] Shortness Of Breath    Home Medications:   (Not in a hospital admission)  OB/GYN Status:  No LMP for male patient.  General Assessment Data Location of Assessment: [facility] TTS Assessment: In system Is this a Tele or Face-to-Face Assessment?: Face-to-Face Is this an Initial Assessment or a Re-assessment for this encounter?: Initial Assessment Marital status: Married [place] na Is patient pregnant?: No Pregnancy Status: No Living Arrangements: Spouse/significant other Can pt return to current living arrangement?: Yes Admission Status: Voluntary Is patient capable of signing voluntary admission?: Yes Referral Source: Self/Family/Friend Insurance type: Medicaid  Medical Screening Exam [facility] Walk-in ONLY) Medical Exam completed: Yes  Crisis Care Plan Living Arrangements: Spouse/significant other Legal Guardian:  (na) Name of Psychiatrist: None Name of Therapist: None  Education Status Is patient currently in school?: No Current Grade: na Highest grade of school patient has completed: 12 Name of school: na Contact person: na  Risk to self with the past 6 months Suicidal Ideation: Yes-Currently Present (per notes on admission pt has AH telling him to harm self) Has patient been a risk to self within the past 6 months prior to admission? : No Suicidal Intent: No Has patient had any suicidal intent within the past 6 months prior to admission? : No Is patient at risk for suicide?: Yes (per notes) Suicidal Plan?: No Has patient had any suicidal plan within the past 6 months prior to admission? : No Access to Means: No What has been your use of drugs/alcohol within the last 12 months?:  (denies) Previous Attempts/Gestures: No How many times?: 0 Other Self Harm Risks: none Triggers for Past Attempts: Unknown  Intentional Self Injurious Behavior: None Family Suicide History: No Recent stressful life event(s): Other (Comment) (UTA) Persecutory voices/beliefs?: No Depression:  (UTA) Depression Symptoms:   (UTA) Substance abuse history and/or treatment for substance abuse?: No Suicide prevention information given to non-admitted patients: Not applicable  Risk to Others within the past 6 months Homicidal Ideation: No (UTA) Does patient have any lifetime risk of violence toward others beyond the six months prior to admission? : No (per notes) Thoughts of Harm to Others:  (UTA) Current Homicidal Intent:  (UTA) Current Homicidal Plan:  (UTA) Access to Homicidal Means:  (UTA) Identified Victim:  (UTA) History of harm to others?:  (UTA) Assessment of Violence:  (UTA) Violent Behavior Description:  (UTA) Does patient have access to weapons?:  (UTA) Criminal Charges Pending?:  (UTA) Does patient have a court date:  (UTA) Court Date:  (UTA) Is patient on probation?:  (UTA)  Psychosis Hallucinations: Auditory (per notes) Delusions: None noted  Mental Status Report Appearance/Hygiene: In scrubs Eye Contact: Unable to Assess Motor Activity: Unable to assess Speech: Unable to assess Level of Consciousness: Unable to assess Mood:  (UTA) Affect:  (UTA) Anxiety Level:  (UTA) Thought Processes:  (UTA) Judgement:  (UTA) Orientation:  (UTA) Obsessive Compulsive Thoughts/Behaviors:  (UTA)  Cognitive Functioning Concentration:  (UTA) Memory:  (UTA) IQ:  (UTA) Insight:  (UTA) Impulse Control:  (UTA) Appetite:  (UTA) Weight Loss:  (UTA) Weight Gain:  (UTA) Sleep:  (UTA) Total Hours of Sleep:  (UTA) Vegetative Symptoms:  (UTA)  ADLScreening [facility]) Patient's cognitive ability adequate to safely complete daily activities?: Yes Patient able to express need for assistance with ADLs?: Yes Independently performs ADLs?: Yes (appropriate for developmental age)  Prior Inpatient Therapy Prior Inpatient Therapy: Yes Prior Therapy Dates: 2017 Prior Therapy Facilty/Provider(s): [facility] Reason for Treatment: MH issues  Prior Outpatient Therapy Prior Outpatient Therapy:  No Prior Therapy Dates: na Prior Therapy Facilty/Provider(s): na Reason for Treatment: na Does patient have an ACCT team?: No Does patient have Intensive In-House Services?  : No Does patient have [facility] services? : No Does patient have P4CC services?: No  ADL Screening (condition at time of admission) Patient's cognitive ability adequate to safely complete daily activities?: Yes Is the patient deaf or have difficulty hearing?: No Does the patient have difficulty seeing, even when wearing glasses/contacts?: No Does the patient have difficulty concentrating, remembering, or making decisions?: No Patient able to express need for assistance with ADLs?: Yes Does the patient have difficulty dressing or bathing?: No Independently performs ADLs?: Yes (appropriate for developmental age) Does the patient have difficulty walking or climbing stairs?: No Weakness of Legs: None Weakness of Arms/Hands: None  Home Assistive Devices/Equipment Home Assistive Devices/Equipment: None  Therapy Consults (therapy consults require a physician order) PT Evaluation Needed: No OT Evalulation Needed: No SLP Evaluation Needed: No Abuse/Neglect Assessment (Assessment to be complete while patient is alone) Physical Abuse: Denies Verbal Abuse: Denies Sexual Abuse: Denies Exploitation of patient/patient's resources: Denies Self-Neglect: Denies Values / Beliefs Cultural Requests During Hospitalization: None Spiritual Requests During Hospitalization: None Consults Spiritual Care Consult Needed: No Social Work Consult Needed: No Merchant navy officer (For Healthcare) Does patient have an advance directive?: No Would patient like information on creating an advanced directive?: No - patient declined information    Additional Information 1:1 In Past 12 Months?: No CIRT Risk: No Elopement Risk: No Does patient have medical clearance?: Yes     Disposition: Case was staffed with Shaune Pollack DNP who recommended  patient be re-evaluated  in the a.m   Disposition Initial Assessment Completed for this Encounter: Yes Disposition of Patient: Other dispositions Type of inpatient treatment program: Adult Other disposition(s):  (pt to be re-evaluated in the a.m.)  On Site Evaluation by:   Reviewed with Physician:    Alfredia Ferguson 09/02/2016 5:52 PM

## 2016-09-02 NOTE — Progress Notes (Signed)
Patient noted to have had 14 ED visits within the last six months. Patient listed as having Medicaid [place] Access insurance. Patient noted to have a scheduled follow up appointment at the  [facility] on 09/06/2016 at 1330pm to establish care as new patient and hospital follow up.

## 2016-09-02 NOTE — ED Notes (Signed)
Patient noted sleeping in room. No complaints, stable, in no acute distress. Q15 minute rounds and monitoring via Security Cameras to continue.  

## 2016-09-02 NOTE — BH Assessment (Signed)
[facility] Assessment Progress Note   Case was staffed with Lord DNP who recommended patient be re-evaluated in the a.m.    

## 2016-09-02 NOTE — ED Notes (Signed)
Report to include Situation, Background, Assessment, and Recommendations received from RN. Patient alert and oriented, warm and dry, in no acute distress. Patient denies SI, HI, VH and pain. Patient states he hears voices saying they are "going to kill me". Patient made aware of Q15 minute rounds and security cameras for their safety. Patient instructed to come to me with needs or concerns.

## 2016-09-03 DIAGNOSIS — F4322 Adjustment disorder with anxiety: Secondary | ICD-10-CM

## 2016-09-03 DIAGNOSIS — Z79899 Other long term (current) drug therapy: Secondary | ICD-10-CM

## 2016-09-03 DIAGNOSIS — F17210 Nicotine dependence, cigarettes, uncomplicated: Secondary | ICD-10-CM | POA: Diagnosis not present

## 2016-09-03 DIAGNOSIS — Z765 Malingerer [conscious simulation]: Secondary | ICD-10-CM

## 2016-09-03 DIAGNOSIS — Z5900 Homelessness unspecified: Secondary | ICD-10-CM

## 2016-09-03 MED ORDER — ASENAPINE MALEATE 5 MG SL SUBL
5.0000 mg | SUBLINGUAL_TABLET | Freq: Two times a day (BID) | SUBLINGUAL | Status: DC
  Administered 2016-09-03: 5 mg via SUBLINGUAL
  Filled 2016-09-03: qty 1

## 2016-09-03 NOTE — ED Notes (Signed)
Patient noted sleeping in room. No complaints, stable, in no acute distress. Q15 minute rounds and monitoring via Security Cameras to continue.  

## 2016-09-03 NOTE — [facility] Suicide Risk Assessment (Signed)
Suicide Risk Assessment  Discharge Assessment   [facility]   Principal Problem: Adjustment disorder with anxiety Discharge Diagnoses:  Patient Active Problem List   Diagnosis Date Noted  . Homelessness [Z59.0] 09/03/2016    Priority: High  . Person feigning illness [Z76.5] 09/03/2016    Priority: High  . Adjustment disorder with anxiety [F43.22] 03/12/2014    Priority: High  . 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 Cannabis use disorder, mild, abuse [F12.10] 08/29/2016  . Pulmonary emboli (HCC) [I26.99] 07/13/2016  . Pulmonary embolism and infarction (HCC) [I26.99] 01/02/2014  . Acute left flank pain [R10.9] 01/02/2014  . Pulmonary embolism, bilateral (HCC) [I26.99] 01/02/2014    Total Time spent with patient: 45 minutes  sculoskeletal: Strength & Muscle Tone: within normal limits Gait & Station: normal Patient leans: N/A  Psychiatric Specialty Exam: Physical Exam  Constitutional: He is oriented to person, place, and time. He appears well-developed and well-nourished.  HENT:  Head: Normocephalic.  Neck: Normal range of motion.  Respiratory: Effort normal.  Musculoskeletal: Normal range of motion.  Neurological: He is alert and oriented to person, place, and time.  Skin: Skin is warm and dry.  Psychiatric: He has a normal mood and affect. His speech is normal and behavior is normal. Judgment and thought content normal. Cognition and memory are normal.    Review of Systems  Constitutional: Negative.   HENT: Negative.   Eyes: Negative.   Respiratory: Negative.   Cardiovascular: Negative.   Gastrointestinal: Negative.   Genitourinary: Negative.   Musculoskeletal: Negative.   Skin: Negative.   Neurological: Negative.   Endo/Heme/Allergies: Negative.   Psychiatric/Behavioral: Negative.     Blood pressure 120/60, pulse 100, temperature 97.5 F (36.4 C), temperature source Oral, resp. rate 18, height 6' (1.829 m), weight 101.2 kg (223  lb), SpO2 99 %.Body mass index is 30.24 kg/m.  General Appearance: Casual  Eye Contact:  Good  Speech:  Normal Rate  Volume:  Normal  Mood:  Irritable, mild  Affect:  Congruent  Thought Process:  Coherent and Descriptions of Associations: Intact  Orientation:  Full (Time, Place, and Person)  Thought Content:  WDL  Suicidal Thoughts:  No  Homicidal Thoughts:  No  Memory:  Immediate;   Good Recent;   Good Remote;   Good  Judgement:  Fair  Insight:  Fair  Psychomotor Activity:  Normal  Concentration:  Concentration: Good and Attention Span: Good  Recall:  Good  Fund of Knowledge:  Fair  Language:  Good  Akathisia:  No  Handed:  Right  AIMS (if indicated):     Assets:  Leisure Time Physical Health Resilience  ADL's:  Intact  Cognition:  WNL  Sleep:      Mental Status Per Nursing Assessment::   On Admission:   hallucinations   Demographic Factors:  Male  Loss Factors: NA  Historical Factors: NA  Risk Reduction Factors:   Sense of responsibility to family  Continued Clinical Symptoms:  None  Cognitive Features That Contribute To Risk:  None    Suicide Risk:  Minimal: No identifiable suicidal ideation.  Patients presenting with no risk factors but with morbid ruminations; may be classified as minimal risk based on the severity of the depressive symptoms    Plan Of Care/Follow-up recommendations:  Activity:  as tolerated Diet:  heart healthy diet  LORD, JAMISON, NP 09/03/2016, 10:36 AM

## 2016-09-03 NOTE — Consult Note (Signed)
[place] [facility] Consult   Reason for Consult:  Hallucinations  Referring Physician:  EDP Patient Identification: Cole Barrett MRN:  [id number] Principal Diagnosis: Adjustment disorder with anxiety Diagnosis:   Patient Active Problem List   Diagnosis Date Noted  . Homelessness [Z59.0] 09/03/2016    Priority: High  . Person feigning illness [Z76.5] 09/03/2016    Priority: High  . Adjustment disorder with anxiety [F43.22] 03/12/2014    Priority: High  . 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 Cannabis use disorder, mild, abuse [F12.10] 08/29/2016  . Pulmonary emboli ([place]) [I26.99] 07/13/2016  . Pulmonary embolism and infarction ([place]) [I26.99] 01/02/2014  . Acute left flank pain [R10.9] 01/02/2014  . Pulmonary embolism, bilateral ([place]) [I26.99] 01/02/2014    Total Time spent with patient: 45 minutes  Subjective:   [name] is a 30 y.o. male patient does not warrant admission.  HPI:  30 yo male who presented to the ED with hallucinations.  He was just released from [facility] where he was bullying other patients and demanding services.  Not responding to internal stimuli, no suicidal/homicidal ideations or alcohol/drug abuse.  Stable for discharge.  Past Psychiatric History: substance abuse with psychosis  Risk to Self: Suicidal Ideation: Yes-Currently Present (per notes on admission pt has AH telling him to harm self) Suicidal Intent: No Is patient at risk for suicide?: Yes (per notes) Suicidal Plan?: No Access to Means: No What has been your use of drugs/alcohol within the last 12 months?:  (denies) How many times?: 0 Other Self Harm Risks: none Triggers for Past Attempts: Unknown Intentional Self Injurious Behavior: None Risk to Others: Homicidal Ideation: No (UTA) Thoughts of Harm to Others:  (UTA) Current Homicidal Intent:  (UTA) Current Homicidal Plan:  (UTA) Access to Homicidal Means:  (UTA) Identified Victim:  (UTA) History of harm to  others?:  (UTA) Assessment of Violence:  (UTA) Violent Behavior Description:  (UTA) Does patient have access to weapons?:  ([place]) Criminal Charges Pending?:  (UTA) Does patient have a court date:  (UTA) Court Date:  (UTA) Prior Inpatient Therapy: Prior Inpatient Therapy: Yes Prior Therapy Dates: 2017 Prior Therapy Facilty/Provider(s): [facility] Reason for Treatment: MH issues Prior Outpatient Therapy: Prior Outpatient Therapy: No Prior Therapy Dates: na Prior Therapy Facilty/Provider(s): na Reason for Treatment: na Does patient have an ACCT team?: No Does patient have Intensive In-House Services?  : No Does patient have [facility] services? : No Does patient have P4CC services?: No  Past Medical History:  Past Medical History:  Diagnosis Date  . Homelessness   . Hypercholesterolemia   . Mental disorder   . Paranoid behavior ([place])   . PE (pulmonary embolism)   . Schizophrenia ([place])    History reviewed. No pertinent surgical history. Family History: No family history on file. Family Psychiatric  History: none Social History:  History  Alcohol Use  . Yes    Comment: 1 40 oz per day     History  Drug Use  . Types: Marijuana    Comment: 3 to 4 wks ago    Social History   Social History  . Marital status: Single    Spouse name: N/A  . Number of children: N/A  . Years of education: N/A   Social History Main Topics  . Smoking status: Current Every Day Smoker    Packs/day: 0.25    Years: 4.00    Types: Cigarettes  . Smokeless tobacco: Never Used  . Alcohol use Yes     Comment:  1 40 oz per day  . Drug use:     Types: Marijuana     Comment: 3 to 4 wks ago  . Sexual activity: No   Other Topics Concern  . None   Social History Narrative  . None   Additional Social History:    Allergies:   Allergies  Allergen Reactions  . Haldol [Haloperidol] Shortness Of Breath    Labs:  Results for orders placed or performed during the hospital encounter of 09/02/16 (from the  past 48 hour(s))  Rapid urine drug screen (hospital performed)     Status: None   Collection Time: 09/02/16  2:22 PM  Result Value Ref Range   Opiates NONE DETECTED NONE DETECTED   Cocaine NONE DETECTED NONE DETECTED   Benzodiazepines NONE DETECTED NONE DETECTED   Amphetamines NONE DETECTED NONE DETECTED   Tetrahydrocannabinol NONE DETECTED NONE DETECTED   Barbiturates NONE DETECTED NONE DETECTED    Comment:        DRUG SCREEN FOR MEDICAL PURPOSES ONLY.  IF CONFIRMATION IS NEEDED FOR ANY PURPOSE, NOTIFY LAB WITHIN 5 DAYS.        LOWEST DETECTABLE LIMITS FOR URINE DRUG SCREEN Drug Class       Cutoff (ng/mL) Amphetamine      1000 Barbiturate      200 Benzodiazepine   614 Tricyclics       709 Opiates          300 Cocaine          300 THC              50   Comprehensive metabolic panel     Status: Abnormal   Collection Time: 09/02/16  2:46 PM  Result Value Ref Range   Sodium 137 135 - 145 mmol/L   Potassium 4.1 3.5 - 5.1 mmol/L   Chloride 104 101 - 111 mmol/L   CO2 27 22 - 32 mmol/L   Glucose, Bld 90 65 - 99 mg/dL   BUN 14 6 - 20 mg/dL   Creatinine, Ser 1.02 0.61 - 1.24 mg/dL   Calcium 8.6 (L) 8.9 - 10.3 mg/dL   Total Protein 7.5 6.5 - 8.1 g/dL   Albumin 3.7 3.5 - 5.0 g/dL   AST 25 15 - 41 U/L   ALT 36 17 - 63 U/L   Alkaline Phosphatase 45 38 - 126 U/L   Total Bilirubin 0.9 0.3 - 1.2 mg/dL   GFR calc non Af Amer >60 >60 mL/min   GFR calc Af Amer >60 >60 mL/min    Comment: (NOTE) The eGFR has been calculated using the CKD EPI equation. This calculation has not been validated in all clinical situations. eGFR's persistently <60 mL/min signify possible Chronic Kidney Disease.    Anion gap 6 5 - 15  Ethanol     Status: None   Collection Time: 09/02/16  2:46 PM  Result Value Ref Range   Alcohol, Ethyl (B) <5 <5 mg/dL    Comment:        LOWEST DETECTABLE LIMIT FOR SERUM ALCOHOL IS 5 mg/dL FOR MEDICAL PURPOSES ONLY   Salicylate level     Status: None   Collection  Time: 09/02/16  2:46 PM  Result Value Ref Range   Salicylate Lvl <2.9 2.8 - 30.0 mg/dL  Acetaminophen level     Status: Abnormal   Collection Time: 09/02/16  2:46 PM  Result Value Ref Range   Acetaminophen (Tylenol), Serum <10 (L) 10 - 30 ug/mL  Comment:        THERAPEUTIC CONCENTRATIONS VARY SIGNIFICANTLY. A RANGE OF 10-30 ug/mL MAY BE AN EFFECTIVE CONCENTRATION FOR MANY PATIENTS. HOWEVER, SOME ARE BEST TREATED AT CONCENTRATIONS OUTSIDE THIS RANGE. ACETAMINOPHEN CONCENTRATIONS >150 ug/mL AT 4 HOURS AFTER INGESTION AND >50 ug/mL AT 12 HOURS AFTER INGESTION ARE OFTEN ASSOCIATED WITH TOXIC REACTIONS.   cbc     Status: Abnormal   Collection Time: 09/02/16  2:46 PM  Result Value Ref Range   WBC 6.0 4.0 - 10.5 K/uL   RBC 4.07 (L) 4.22 - 5.81 MIL/uL   Hemoglobin 11.2 (L) 13.0 - 17.0 g/dL   HCT 34.4 (L) 39.0 - 52.0 %   MCV 84.5 78.0 - 100.0 fL   MCH 27.5 26.0 - 34.0 pg   MCHC 32.6 30.0 - 36.0 g/dL   RDW 13.6 11.5 - 15.5 %   Platelets 335 150 - 400 K/uL    Current Facility-Administered Medications  Medication Dose Route Frequency Provider Last Rate Last Dose  . asenapine (SAPHRIS) sublingual tablet 5 mg  5 mg Sublingual BID Patrecia Pour, NP      . benztropine (COGENTIN) tablet 0.5 mg  0.5 mg Oral BID Patrecia Pour, NP   0.5 mg at 09/02/16 2154  . cloNIDine HCl (KAPVAY) ER tablet 0.1 mg  0.1 mg Oral QHS Patrecia Pour, NP   0.1 mg at 09/02/16 2155  . pravastatin (PRAVACHOL) tablet 20 mg  20 mg Oral QHS Patrecia Pour, NP   20 mg at 09/02/16 2155  . traZODone (DESYREL) tablet 50 mg  50 mg Oral QHS,MR X 1 Patrecia Pour, NP   50 mg at 09/02/16 2155   Current Outpatient Prescriptions  Medication Sig Dispense Refill  . pravastatin (PRAVACHOL) 20 MG tablet Take 20 mg by mouth at bedtime.    . risperiDONE (RISPERDAL) 3 MG tablet Take 1 tablet (3 mg total) by mouth at bedtime. 30 tablet 0  . Rivaroxaban (XARELTO) 15 MG TABS tablet Take 15 mg by mouth 2 (two) times daily.     Marland Kitchen  acetaminophen 325 MG tablet Take 2 tablets (650 mg total) by mouth every 4 (four) hours as needed. 40 tablet 0  . benztropine (COGENTIN) 0.5 MG tablet Take 1 tablet (0.5 mg total) by mouth 2 (two) times daily. 60 tablet 0  . cloNIDine HCl (KAPVAY) 0.1 MG TB12 ER tablet Take 1 tablet (0.1 mg total) by mouth at bedtime. 30 tablet 0  . risperiDONE (RISPERDAL) 1 MG tablet Take 1 tablet (1 mg total) by mouth every morning. 30 tablet 0  . rivaroxaban (XARELTO) 20 MG TABS tablet Take 1 tablet (20 mg total) by mouth daily with supper. 30 tablet 0  . traZODone (DESYREL) 50 MG tablet Take 1 tablet (50 mg total) by mouth at bedtime and may repeat dose one time if needed. 30 tablet 0    Musculoskeletal: Strength & Muscle Tone: within normal limits Gait & Station: normal Patient leans: N/A  Psychiatric Specialty Exam: Physical Exam  Constitutional: He is oriented to person, place, and time. He appears well-developed and well-nourished.  HENT:  Head: Normocephalic.  Neck: Normal range of motion.  Respiratory: Effort normal.  Musculoskeletal: Normal range of motion.  Neurological: He is alert and oriented to person, place, and time.  Skin: Skin is warm and dry.  Psychiatric: He has a normal mood and affect. His speech is normal and behavior is normal. Judgment and thought content normal. Cognition and memory are normal.  Review of Systems  Constitutional: Negative.   HENT: Negative.   Eyes: Negative.   Respiratory: Negative.   Cardiovascular: Negative.   Gastrointestinal: Negative.   Genitourinary: Negative.   Musculoskeletal: Negative.   Skin: Negative.   Neurological: Negative.   Endo/Heme/Allergies: Negative.   Psychiatric/Behavioral: Negative.     Blood pressure 120/60, pulse 100, temperature 97.5 F (36.4 C), temperature source Oral, resp. rate 18, height 6' (1.829 m), weight 101.2 kg (223 lb), SpO2 99 %.Body mass index is 30.24 kg/m.  General Appearance: Casual  Eye Contact:  Good   Speech:  Normal Rate  Volume:  Normal  Mood:  Irritable, mild  Affect:  Congruent  Thought Process:  Coherent and Descriptions of Associations: Intact  Orientation:  Full (Time, Place, and Person)  Thought Content:  WDL  Suicidal Thoughts:  No  Homicidal Thoughts:  No  Memory:  Immediate;   Good Recent;   Good Remote;   Good  Judgement:  Fair  Insight:  Fair  Psychomotor Activity:  Normal  Concentration:  Concentration: Good and Attention Span: Good  Recall:  Good  Fund of Knowledge:  Fair  Language:  Good  Akathisia:  No  Handed:  Right  AIMS (if indicated):     Assets:  Leisure Time Physical Health Resilience  ADL's:  Intact  Cognition:  WNL  Sleep:        Treatment Plan Summary: Daily contact with patient to assess and evaluate symptoms and progress in treatment, Medication management and Plan adjustment disorder with anxiety:  -Crisis stabilization -Medication management:  Medical medications continued, Risperdal changed to Saphris 5 mg BID for psychosis.  Continued cogentin 0.5 mg BID and Trazodone 50 mg at bedtime for sleep. -Individual counseling -[facility] referral  Disposition: No evidence of imminent risk to self or others at present.    Waylan Boga, NP 09/03/2016 10:25 AM  Patient seen face-to-face for psychiatric evaluation, chart reviewed and case discussed with the physician extender and developed treatment plan. Reviewed the information documented and agree with the treatment plan. Corena Pilgrim, MD

## 2016-09-04 ENCOUNTER — Encounter (HOSPITAL_COMMUNITY): Payer: Self-pay | Admitting: Emergency Medicine

## 2016-09-04 ENCOUNTER — Emergency Department (HOSPITAL_COMMUNITY)
Admission: EM | Admit: 2016-09-04 | Discharge: 2016-09-04 | Disposition: A | Discharge status: Home / Self Care | Payer: Medicaid Other | Attending: Emergency Medicine | Admitting: Emergency Medicine

## 2016-09-04 DIAGNOSIS — R440 Auditory hallucinations: Secondary | ICD-10-CM

## 2016-09-04 DIAGNOSIS — Z590 Homelessness: Secondary | ICD-10-CM | POA: Diagnosis not present

## 2016-09-04 DIAGNOSIS — Z7901 Long term (current) use of anticoagulants: Secondary | ICD-10-CM | POA: Diagnosis not present

## 2016-09-04 DIAGNOSIS — F17210 Nicotine dependence, cigarettes, uncomplicated: Secondary | ICD-10-CM | POA: Insufficient documentation

## 2016-09-04 DIAGNOSIS — F200 Paranoid schizophrenia: Secondary | ICD-10-CM | POA: Diagnosis present

## 2016-09-04 LAB — COMPREHENSIVE METABOLIC PANEL
ALK PHOS: 43 U/L (ref 38–126)
ALT: 33 U/L (ref 17–63)
ANION GAP: 12 (ref 5–15)
AST: 23 U/L (ref 15–41)
Albumin: 3.6 g/dL (ref 3.5–5.0)
BILIRUBIN TOTAL: 0.4 mg/dL (ref 0.3–1.2)
BUN: 11 mg/dL (ref 6–20)
CALCIUM: 9 mg/dL (ref 8.9–10.3)
CO2: 24 mmol/L (ref 22–32)
CREATININE: 1.01 mg/dL (ref 0.61–1.24)
Chloride: 104 mmol/L (ref 101–111)
GFR calc non Af Amer: >60 mL/min (ref >60)
GLUCOSE: 88 mg/dL (ref 65–99)
Potassium: 3.9 mmol/L (ref 3.5–5.1)
SODIUM: 140 mmol/L (ref 135–145)
TOTAL PROTEIN: 7.2 g/dL (ref 6.5–8.1)

## 2016-09-04 LAB — CBC WITH DIFFERENTIAL/PLATELET
Basophils Absolute: 0 10*3/uL (ref 0.0–0.1)
Basophils Relative: 1 %
EOS ABS: 0.6 10*3/uL (ref 0.0–0.7)
Eosinophils Relative: 8 %
HEMATOCRIT: 34.9 % — AB (ref 39.0–52.0)
HEMOGLOBIN: 11.2 g/dL — AB (ref 13.0–17.0)
LYMPHS ABS: 2 10*3/uL (ref 0.7–4.0)
LYMPHS PCT: 30 %
MCH: 27.1 pg (ref 26.0–34.0)
MCHC: 32.1 g/dL (ref 30.0–36.0)
MCV: 84.5 fL (ref 78.0–100.0)
MONOS PCT: 10 %
Monocytes Absolute: 0.7 10*3/uL (ref 0.1–1.0)
NEUTROS ABS: 3.4 10*3/uL (ref 1.7–7.7)
NEUTROS PCT: 51 %
Platelets: 305 10*3/uL (ref 150–400)
RBC: 4.13 MIL/uL — AB (ref 4.22–5.81)
RDW: 13.4 % (ref 11.5–15.5)
WBC: 6.6 10*3/uL (ref 4.0–10.5)

## 2016-09-04 LAB — RAPID URINE DRUG SCREEN, HOSP PERFORMED
Amphetamines: NOT DETECTED
BARBITURATES: NOT DETECTED
Benzodiazepines: NOT DETECTED
Cocaine: NOT DETECTED
OPIATES: NOT DETECTED
TETRAHYDROCANNABINOL: NOT DETECTED

## 2016-09-04 LAB — ETHANOL: Alcohol, Ethyl (B): 103 mg/dL — ABNORMAL HIGH (ref <5)

## 2016-09-04 MED ORDER — RISPERIDONE 1 MG PO TABS
1.0000 mg | ORAL_TABLET | Freq: Once | ORAL | Status: AC
  Administered 2016-09-04: 1 mg via ORAL
  Filled 2016-09-04: qty 1

## 2016-09-04 MED ORDER — RISPERIDONE 3 MG PO TABS: 3.0000 mg | ORAL_TABLET | Freq: Every day | ORAL | 0 refills | Status: DC

## 2016-09-04 NOTE — ED Notes (Signed)
Arrived to room and fell immediately asleep.  Belongings inventoried and placed in container.  Medications inventoried and taken to pharmacy.  No valuables in security.

## 2016-09-04 NOTE — ED Triage Notes (Signed)
Pt presents with auditory hallucinations and paranoid thoughts

## 2016-09-04 NOTE — ED Notes (Signed)
Pt given Ginger Ale as requested. Pt eating breakfast.

## 2016-09-04 NOTE — ED Notes (Signed)
Pt given sandwich and Ginger Ale as requested for snack. Pt returned to sleeping rather than eating.

## 2016-09-04 NOTE — ED Notes (Signed)
Telepsych being performed. 

## 2016-09-04 NOTE — ED Notes (Signed)
Shower supplies given

## 2016-09-04 NOTE — BH Assessment (Addendum)
Tele Assessment Note   Cole Barrett is an 30 y.o. male who presents unaccompanied to [facility] ED requesting admission to [facility]. Pt has presented to the ED several times over the past month and was psychiatrically evaluated and cleared at [facility] less than twenty-four hour ago. Pt says he wants to be admitted to [facility] "for a long time" because he is hearing voices telling him they are going to kill him. Pt denies these voices are command in nature. Pt denies visual hallucinations. Pt denies depressive symptoms and describes his mood as "cool." He denies problems with sleep or appetite. He denies current suicidal ideation or history of suicide attempts. He denies any history of intentional self-injurious behavior. He denies current homicidal ideation or history of violence. Pt reports he drank one 40-ounce beer today and Pt's blood alcohol level is 103. Pt denies substance abuse and Pt's urine drug screen is negative.   Pt cannot identify any stressors other than his auditory hallucinations. Pt reports he does have a place to live but has no family or friends who are supportive. He states he has not followed up with [facility] but agrees to do so. Per medical record, he was  released from [facility] 08/30/16 where he was bullying other patients and demanding services.   Pt is dressed in hospital scrubs, drowsy, oriented x4 with normal speech and normal motor behavior. Eye contact is minimal and Pt kept his eyes closed through most of the assessment. Pt's mood is euthymic and affect is congruent with mood. Thought process is coherent and relevant. Pt gave brief responses to questions.   Diagnosis: Adjustment disorder with anxiety (by history)  Past Medical History:  Past Medical History:  Diagnosis Date  . Homelessness   . Hypercholesterolemia   . Mental disorder   . Paranoid behavior (HCC)   . PE (pulmonary embolism)   . Schizophrenia (HCC)     History reviewed. No pertinent surgical  history.  Family History: No family history on file.  Social History:  reports that he has been smoking Cigarettes.  He has a 1.00 pack-year smoking history. He has never used smokeless tobacco. He reports that he drinks alcohol. He reports that he uses drugs, including Marijuana.  Additional Social History:  Alcohol / Drug Use Pain Medications: Denies Prescriptions: Denies Over the Counter: Denies Longest period of sobriety (when/how long): unknown  CIWA: CIWA-Ar BP: 109/72 Pulse Rate: 94 COWS:    PATIENT STRENGTHS: (choose at least two) Ability for insight Average or above average intelligence Capable of independent living Communication skills Motivation for treatment/growth Physical Health  Allergies:  Allergies  Allergen Reactions  . Haldol [Haloperidol] Shortness Of Breath    Home Medications:  (Not in a hospital admission)  OB/GYN Status:  No LMP for male patient.  General Assessment Data Location of Assessment: [facility] ED TTS Assessment: In system Is this a Tele or Face-to-Face Assessment?: Tele Assessment Is this an Initial Assessment or a Re-assessment for this encounter?: Initial Assessment Marital status: Married [place] name: NA Is patient pregnant?: No Pregnancy Status: No Living Arrangements: Spouse/significant other Can pt return to current living arrangement?: Yes Admission Status: Voluntary Is patient capable of signing voluntary admission?: Yes Referral Source: Self/Family/Friend Insurance type: Medicaid     Crisis Care Plan Living Arrangements: Spouse/significant other Legal Guardian: Other: (Self) Name of Psychiatrist: None Name of Therapist: None  Education Status Is patient currently in school?: No Current Grade: NA Highest grade of school patient has completed: 50  Name of school: NA Contact person: NA  Risk to self with the past 6 months Suicidal Ideation: No Has patient been a risk to self within the past 6 months prior to  admission? : No Suicidal Intent: No Has patient had any suicidal intent within the past 6 months prior to admission? : No Is patient at risk for suicide?: No Suicidal Plan?: No Has patient had any suicidal plan within the past 6 months prior to admission? : No Access to Means: No What has been your use of drugs/alcohol within the last 12 months?: Pt reports he drinks alcohol Previous Attempts/Gestures: No How many times?: 0 Other Self Harm Risks: Pt denies Triggers for Past Attempts: None known Intentional Self Injurious Behavior: None Family Suicide History: No Recent stressful life event(s): Other (Comment) (Pt denies stressors) Persecutory voices/beliefs?: Yes Depression: No Depression Symptoms:  (Pt denies depressive symptoms) Substance abuse history and/or treatment for substance abuse?: No Suicide prevention information given to non-admitted patients: Not applicable  Risk to Others within the past 6 months Homicidal Ideation: No Does patient have any lifetime risk of violence toward others beyond the six months prior to admission? : No Thoughts of Harm to Others: No Current Homicidal Intent: No Current Homicidal Plan: No Access to Homicidal Means: No Identified Victim: None History of harm to others?: No Assessment of Violence: None Noted Violent Behavior Description: Pt denies history of violence Does patient have access to weapons?: No Criminal Charges Pending?: No Does patient have a court date: Yes Court Date: 09/23/16 Is patient on probation?: No  Psychosis Hallucinations: Auditory (Reports hearing voices) Delusions: None noted  Mental Status Report Appearance/Hygiene: In scrubs Eye Contact: Poor Motor Activity: Unremarkable Speech: Logical/coherent Level of Consciousness: Drowsy Mood: Euthymic Affect: Appropriate to circumstance Anxiety Level: None Thought Processes: Coherent, Relevant Judgement: Unimpaired Orientation: Person, Place, Time, Situation,  Appropriate for developmental age Obsessive Compulsive Thoughts/Behaviors: None  Cognitive Functioning Concentration: Normal Memory: Recent Intact, Remote Intact IQ: Average Insight: Fair Impulse Control: Fair Appetite: Good Weight Loss: 0 Weight Gain: 0 Sleep: No Change Total Hours of Sleep: 8 Vegetative Symptoms: None  ADLScreening [facility] Assessment Services) Patient's cognitive ability adequate to safely complete daily activities?: Yes Patient able to express need for assistance with ADLs?: Yes Independently performs ADLs?: Yes (appropriate for developmental age)  Prior Inpatient Therapy Prior Inpatient Therapy: Yes Prior Therapy Dates: 2017 [facility] Reason for Treatment: MH issues  Prior Outpatient Therapy Prior Outpatient Therapy: No Prior Therapy Dates: na Prior Therapy Facilty/Provider(s): na Reason for Treatment: na Does patient have an ACCT team?: No Does patient have Intensive In-House Services?  : No Does patient have [facility] services? : No Does patient have P4CC services?: No  ADL Screening (condition at time of admission) Patient's cognitive ability adequate to safely complete daily activities?: Yes Is the patient deaf or have difficulty hearing?: No Does the patient have difficulty seeing, even when wearing glasses/contacts?: No Does the patient have difficulty concentrating, remembering, or making decisions?: No Patient able to express need for assistance with ADLs?: Yes Does the patient have difficulty dressing or bathing?: No Independently performs ADLs?: Yes (appropriate for developmental age) Does the patient have difficulty walking or climbing stairs?: No Weakness of Legs: None Weakness of Arms/Hands: None       Abuse/Neglect Assessment (Assessment to be complete while patient is alone) Physical Abuse: Denies Verbal Abuse: Denies Sexual Abuse: Denies Exploitation of patient/patient's resources:  Denies Self-Neglect: Denies     Merchant navy officerAdvance Directives (For Healthcare)  Does patient have an advance directive?: No Would patient like information on creating an advanced directive?: No - patient declined information    Additional Information 1:1 In Past 12 Months?: No CIRT Risk: No Elopement Risk: No Does patient have medical clearance?: Yes     Disposition: Binnie Rail, AC at [facility], confirms adult unit is currently at capacity. Gave clinical report to [name], NP who recommends Pt be evaluated by psychiatry later this morning. Notified April Palumbo, MD and [place], [place] of recommendation.  Disposition Initial Assessment Completed for this Encounter: Yes Disposition of Patient: Other dispositions Other disposition(s): Other (Comment)   Pamalee Leyden, [facility], [facility], [facility] Triage Specialist [phone]   Patsy Baltimore, Harlin Rain 09/04/2016 5:08 AM

## 2016-09-04 NOTE — ED Provider Notes (Addendum)
[facility]-EMERGENCY DEPT Provider Note   CSN: [id number] Arrival date & time: 09/04/16  4098  By signing my name below, I, Cole Barrett, attest that this documentation has been prepared under the direction and in the presence of Cole Ogborn, MD. Electronically Signed: Doreatha Barrett, ED Scribe. 09/04/16. 3:48 AM.     History   Chief Complaint No chief complaint on file.   HPI Cole Barrett is a 30 y.o. male with h/o homelessness, schizophrenia who presents to the Emergency Department complaining of auditory hallucinations tonight. Pt states he hears voices that "say they are going to kill me". He continues to repeat that he needs to get to behavioral health so that he can "see a doctor". He reports he is attempting to obtain a counselor a Transport planner. Pt states he is currently medicated. He becomes agitated and aggressive during exam and exclaims that he is "psychotic". Pt was seen in the ED yesterday for the same complaints and cleared for discharge per TTS. Pt is well known to the ED for frequent visits with similar complaints. He denies SI.   The history is provided by the patient. No language interpreter was used.  Mental Health Problem  Presenting symptoms: aggressive behavior and hallucinations   Presenting symptoms: no suicidal thoughts   Patient accompanied by: no one. Degree of incapacity (severity):  Moderate Onset quality:  Gradual Timing:  Constant Progression:  Waxing and waning Chronicity:  Chronic Context: noncompliance   Treatment compliance:  Unable to specify Relieved by:  Antipsychotics Risk factors: recent psychiatric admission     Past Medical History:  Diagnosis Date  . Homelessness   . Hypercholesterolemia   . Mental disorder   . Paranoid behavior (HCC)   . PE (pulmonary embolism)   . Schizophrenia [facility])     Patient Active Problem List   Diagnosis Date Noted  . Homelessness 09/03/2016  . Person feigning illness 09/03/2016  . Brief psychotic disorder  08/29/2016  . Cannabis use disorder, mild, abuse 08/29/2016  . Pulmonary emboli (HCC) 07/13/2016  . Adjustment disorder with anxiety 03/12/2014  . 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 Acute left flank pain 01/02/2014  . Pulmonary embolism, bilateral (HCC) 01/02/2014    No past surgical history on file.     Home Medications    Prior to Admission medications   Medication Sig Start Date End Date Taking? Authorizing Provider  acetaminophen 325 MG tablet Take 2 tablets (650 mg total) by mouth every 4 (four) hours as needed. 09/01/16   [name], MD  benztropine (COGENTIN) 0.5 MG tablet Take 1 tablet (0.5 mg total) by mouth 2 (two) times daily. 08/30/16   Adonis Brook, NP  cloNIDine HCl (KAPVAY) 0.1 MG TB12 ER tablet Take 1 tablet (0.1 mg total) by mouth at bedtime. 08/30/16   Adonis Brook, NP  pravastatin (PRAVACHOL) 20 MG tablet Take 20 mg by mouth at bedtime.    Historical Provider, MD  risperiDONE (RISPERDAL) 1 MG tablet Take 1 tablet (1 mg total) by mouth every morning. 08/31/16   Adonis Brook, NP  risperiDONE (RISPERDAL) 3 MG tablet Take 1 tablet (3 mg total) by mouth at bedtime. 08/30/16   Adonis Brook, NP  Rivaroxaban (XARELTO) 15 MG TABS tablet Take 15 mg by mouth 2 (two) times daily.     Historical Provider, MD  rivaroxaban (XARELTO) 20 MG TABS tablet Take 1 tablet (20 mg total) by mouth daily with supper. 08/30/16   Adonis Brook, NP  traZODone (DESYREL) 50 MG tablet  Take 1 tablet (50 mg total) by mouth at bedtime and may repeat dose one time if needed. 08/30/16   Adonis Brook, NP    Family History No family history on file.  Social History Social History  Substance Use Topics  . Smoking status: Current Every Day Smoker    Packs/day: 0.25    Years: 4.00    Types: Cigarettes  . Smokeless tobacco: Never Used  . Alcohol use Yes     Comment: 1 40 oz per day     Allergies   Haldol [haloperidol]   Review of Systems Review of Systems    Psychiatric/Behavioral: Positive for hallucinations. Negative for suicidal ideas.  All other systems reviewed and are negative.    Physical Exam Updated Vital Signs There were no vitals taken for this visit.  Physical Exam  Constitutional: He is oriented to person, place, and time. He appears well-developed and well-nourished.  HENT:  Head: Normocephalic and atraumatic.  Mouth/Throat: Oropharynx is clear and moist. No oropharyngeal exudate.  Eyes: Conjunctivae and EOM are normal. Pupils are equal, round, and reactive to light.  Neck: Normal range of motion. Neck supple. No JVD present. No tracheal deviation present.  No carotid bruits. Trachea midline.   Cardiovascular: Normal rate, regular rhythm and normal heart sounds.  Exam reveals no gallop and no friction rub.   No murmur heard. RRR.   Pulmonary/Chest: Effort normal and breath sounds normal. No stridor. No respiratory distress. He has no wheezes. He has no rales.  Lungs CTA bilaterally.   Abdominal: Soft. Bowel sounds are normal. He exhibits no distension. There is no rebound and no guarding.  Musculoskeletal: Normal range of motion. He exhibits no edema or deformity.  Lymphadenopathy:    He has no cervical adenopathy.  Neurological: He is alert and oriented to person, place, and time. He has normal reflexes.  Skin: Skin is warm and dry. Capillary refill takes less than 2 seconds.  Psychiatric: His affect is angry. His speech is rapid and/or pressured. He is aggressive. He expresses no suicidal plans and no homicidal plans.  Aggressive, tangential, circumstantial   Nursing note and vitals reviewed.   ED Treatments / Results   COORDINATION OF CARE: 3:46 AM Discussed treatment plan with pt at bedside which includes TTS consult and pt agreed to plan.    Vitals:   09/04/16 0403  BP: 109/72  Pulse: 94  Resp: 18  Temp: 98.4 F (36.9 C)   Results for orders placed or performed during the hospital encounter of 09/04/16   CBC with Differential/Platelet  Result Value Ref Range   WBC 6.6 4.0 - 10.5 K/uL   RBC 4.13 (L) 4.22 - 5.81 MIL/uL   Hemoglobin 11.2 (L) 13.0 - 17.0 g/dL   HCT 17.5 (L) 10.2 - 58.5 %   MCV 84.5 78.0 - 100.0 fL   MCH 27.1 26.0 - 34.0 pg   MCHC 32.1 30.0 - 36.0 g/dL   RDW 27.7 82.4 - 23.5 %   Platelets 305 150 - 400 K/uL   Neutrophils Relative % 51 %   Neutro Abs 3.4 1.7 - 7.7 K/uL   Lymphocytes Relative 30 %   Lymphs Abs 2.0 0.7 - 4.0 K/uL   Monocytes Relative 10 %   Monocytes Absolute 0.7 0.1 - 1.0 K/uL   Eosinophils Relative 8 %   Eosinophils Absolute 0.6 0.0 - 0.7 K/uL   Basophils Relative 1 %   Basophils Absolute 0.0 0.0 - 0.1 K/uL  Comprehensive metabolic panel  Result Value Ref Range   Sodium 140 135 - 145 mmol/L   Potassium 3.9 3.5 - 5.1 mmol/L   Chloride 104 101 - 111 mmol/L   CO2 24 22 - 32 mmol/L   Glucose, Bld 88 65 - 99 mg/dL   BUN 11 6 - 20 mg/dL   Creatinine, Ser 6.961.01 0.61 - 1.24 mg/dL   Calcium 9.0 8.9 - 29.510.3 mg/dL   Total Protein 7.2 6.5 - 8.1 g/dL   Albumin 3.6 3.5 - 5.0 g/dL   AST 23 15 - 41 U/L   ALT 33 17 - 63 U/L   Alkaline Phosphatase 43 38 - 126 U/L   Total Bilirubin 0.4 0.3 - 1.2 mg/dL   GFR calc non Af Amer >60 >60 mL/min   GFR calc Af Amer >60 >60 mL/min   Anion gap 12 5 - 15  Ethanol  Result Value Ref Range   Alcohol, Ethyl (B) 103 (H) <5 mg/dL   Dg Chest 2 View  Result Date: 09/01/2016 CLINICAL DATA:  Cough.  History of pulmonary embolism. EXAM: CHEST  2 VIEW COMPARISON:  CT chest August 25, 2016 and chest radiograph August 22, 2016 FINDINGS: Small LEFT pleural effusion and LEFT lung base strandy densities, improved from prior radiograph. RIGHT lung is clear. Cardiomediastinal silhouette is normal for this low inspiratory examination. No pneumothorax. Soft tissue planes and included osseous structures are nonsuspicious. IMPRESSION: Small residual LEFT pleural effusion and atelectasis/scarring. Electronically Signed   By: Awilda Metroourtnay   Bloomer M.D.   On: 09/01/2016 01:30   Dg Chest 2 View  Result Date: 08/22/2016 CLINICAL DATA:  LEFT-sided chest pain tonight. History of pulmonary embolism and infarction. EXAM: CHEST  2 VIEW COMPARISON:  Chest radiograph July 15, 2016 and CT chest July 13, 2016 FINDINGS: Increasing focal consolidation LEFT lower lobe. Cardiac silhouette is upper limits of normal in size, mediastinal silhouette is nonsuspicious. No pleural effusion. No pneumothorax. Soft tissue planes and included osseous structures are normal. IMPRESSION: Increasing consolidation LEFT lower lobe suspicious for pneumonia, less likely pulmonary infarct given patient's history of pulmonary embolism. Electronically Signed   By: Awilda Metroourtnay  Bloomer M.D.   On: 08/22/2016 03:11   Ct Angio Chest Pe W/cm &/or Wo Cm  Result Date: 08/25/2016 CLINICAL DATA:  30 year old male with left-sided chest pain EXAM: CT ANGIOGRAPHY CHEST WITH CONTRAST TECHNIQUE: Multidetector CT imaging of the chest was performed using the standard protocol during bolus administration of intravenous contrast. Multiplanar CT image reconstructions and MIPs were obtained to evaluate the vascular anatomy. CONTRAST:  80 cc Isovue 370 COMPARISON:  Chest radiograph dated 08/22/2016 and chest CT dated 07/13/2016 FINDINGS: There is nonocclusive filling defect within the right lower lobe subsegmental pulmonary artery branch (series 6, image 151) in similar location as the prior CT most likely representing residual clot or scarring. There has been interval decrease in the size of on this clot since the prior study. No acute or new pulmonary artery emboli identified. There is stable mild cardiomegaly. No pericardial effusion. Right hilar and subcarinal adenopathy. Top-normal left hilar lymph nodes noted. The thoracic aorta appears unremarkable. The origins of the great vessels of the aortic arch appear patent. There is a small left pleural effusion with increase in size compared to prior  study. Fluid is noted tracking along the left major fissure as well as along the mediastinal for surface. There is consolidative changes and airspace ground-glass density in the left lower lobe and left lung base which may represent compressive atelectasis versus pneumonia. The  right lung is clear. There is no pneumothorax. The central airways are patent. The esophagus is grossly unremarkable. A small hiatal hernia noted. No thyroid nodules identified. There is no axillary adenopathy. The chest wall soft tissues appear unremarkable. The osseous structures are intact. The visualized upper abdomen is grossly unremarkable. IMPRESSION: No new or acute pulmonary embolism. Residual clot/scarring from the prior PG noted in the subsegmental right lower lobe pulmonary artery branch. Partial consolidative changes of the left lower lobe which may represent atelectasis versus infiltrate. Clinical correlation is recommended. Electronically Signed   By: Elgie Collard M.D.   On: 08/25/2016 04:49   Dg Finger Middle Right  Result Date: 08/23/2016 CLINICAL DATA:  30 year old male with trauma to the middle finger. EXAM: RIGHT MIDDLE FINGER 2+V COMPARISON:  None. FINDINGS: There is no acute fracture or dislocation. The bones are well mineralized. No arthritic changes. There is diffuse soft tissue swelling of the finger. No radiopaque foreign object. IMPRESSION: No acute/traumatic osseous pathology. Electronically Signed   By: Elgie Collard M.D.   On: 08/23/2016 02:49      Procedures Procedures (including critical care time)   Initial Impression / Assessment and Plan / ED Course  I have reviewed the triage vital signs and the nursing notes.  Pertinent labs & imaging results that were available during my care of the patient were reviewed by me and considered in my medical decision making (see chart for details).   Final Clinical Impressions(s) / ED Diagnoses   Auditory hallucinations  New Prescriptions New  Prescriptions   No medications on file  per TSS, cleared earlier now here again stating he need permanent admission.  Will be seen by psychiatry in the am  I personally performed the services described in this documentation, which was scribed in my presence. The recorded information has been reviewed and is accurate.      Cy Blamer, MD 09/04/16 0444    Tiffanie Blassingame, MD 09/04/16 [phone]

## 2016-09-04 NOTE — ED Notes (Signed)
When performing d/c paperwork, pt asking when he has to leave. Advised pt at this time. Information printed and given to pt re: [facility] appt scheduled for 09/05/16 at 2pm. Pt attempting to delay d/c - asked to use bathroom - pt in bathroom for long over 10 min - security called and is standing by assisting.

## 2016-09-04 NOTE — ED Notes (Signed)
Dr Knott in w/pt. 

## 2016-09-04 NOTE — ED Notes (Addendum)
Lawson FiscalLori, NP, [facility] - advised psychiatrist recommends for pt to be given Risperdal 1mg , watch pt for 2 hours then d/c. [facility]. [facility]/16. Advised she will advise EDP.

## 2016-09-04 NOTE — Consult Note (Signed)
[place] [facility] Consult   Reason for Consult:  Hallucinations  Referring Physician:  EDP Patient Identification: Cole Barrett MRN:  [id number] Principal Diagnosis: Paranoid schizophrenia [facility]) Diagnosis:   Patient Active Problem List   Diagnosis Date Noted  . Homelessness [Z59.0] 09/03/2016  . Person feigning illness [Z76.5] 09/03/2016  . 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 Cannabis use disorder, mild, abuse [F12.10] 08/29/2016  . Paranoid schizophrenia ([place]) [F20.0] 08/26/2016  . Pulmonary emboli ([place]) [I26.99] 07/13/2016  . Adjustment disorder with anxiety [F43.22] 03/12/2014  . Pulmonary embolism and infarction ([place]) [I26.99] 01/02/2014  . Acute left flank pain [R10.9] 01/02/2014  . Pulmonary embolism, bilateral ([place]) [I26.99] 01/02/2014    Total Time spent with patient: 30 minutes  Subjective:   Cole Barrett is a 30 y.o. male patient who presents to the [facility] with hallucinations.   HPI: Cole Barrett is a 30 yo male who presented to the [facility] with hallucinations. He was released from [facility] on 09/03/16.  He was released from [facility] on 08/30/16 where he was bullying other patients and demanding services.  He did not appear to be responding to internal stimuli, no suicidal/homicidal ideations during today's assessment. On this admission Pt had alcohol level 103. He states "I need to see a doctor and have someone watch over me,  these voices are saying they are going to kill me." He also states "if you aren't going to admit me to [facility] then send me to [facility]." Discussed case with Dr Parke Poisson. Recommendation to restart Risperdal which Pt was discharged on on 08/30/16. According to note from 09/02/16 Risperdal was changed to Saphris, but Dr Parke Poisson recommends Risperdal which Pt was discharged on due to affordability issues and no indication of allergy to Risperdal.   Past Psychiatric History: Paranoid schizophrenia   Risk to Self: Suicidal Ideation: No Suicidal  Intent: No Is patient at risk for suicide?: No Suicidal Plan?: No Access to Means: No What has been your use of drugs/alcohol within the last 12 months?: Pt reports he drinks alcohol How many times?: 0 Other Self Harm Risks: Pt denies Triggers for Past Attempts: None known Intentional Self Injurious Behavior: None Risk to Others: Homicidal Ideation: No Thoughts of Harm to Others: No Current Homicidal Intent: No Current Homicidal Plan: No Access to Homicidal Means: No Identified Victim: None History of harm to others?: No Assessment of Violence: None Noted Violent Behavior Description: Pt denies history of violence Does patient have access to weapons?: No Criminal Charges Pending?: No Does patient have a court date: Yes Court Date: 09/23/16 Prior Inpatient Therapy: Prior Inpatient Therapy: Yes Prior Therapy Dates: 2017 Prior Therapy Facilty/Provider(s): [facility] Reason for Treatment: MH issues Prior Outpatient Therapy: Prior Outpatient Therapy: No Prior Therapy Dates: na Prior Therapy Facilty/Provider(s): na Reason for Treatment: na Does patient have an ACCT team?: No Does patient have Intensive In-House Services?  : No Does patient have [facility] services? : No Does patient have P4CC services?: No  Past Medical History:  Past Medical History:  Diagnosis Date  . Homelessness   . Hypercholesterolemia   . Mental disorder   . Paranoid behavior ([place])   . PE (pulmonary embolism)   . Schizophrenia ([place])    History reviewed. No pertinent surgical history. Family History: No family history on file. Family Psychiatric  History: none Social History:  History  Alcohol Use  . Yes    Comment: 1 40 oz per day     History  Drug Use  . Types: Marijuana  Comment: 3 to 4 wks ago    Social History   Social History  . Marital status: Single    Spouse name: N/A  . Number of children: N/A  . Years of education: N/A   Social History Main Topics  . Smoking status: Current Every  Day Smoker    Packs/day: 0.25    Years: 4.00    Types: Cigarettes  . Smokeless tobacco: Never Used  . Alcohol use Yes     Comment: 1 40 oz per day  . Drug use:     Types: Marijuana     Comment: 3 to 4 wks ago  . Sexual activity: No   Other Topics Concern  . None   Social History Narrative  . None   Additional Social History:    Allergies:   Allergies  Allergen Reactions  . Haldol [Haloperidol] Shortness Of Breath    Labs:  Results for orders placed or performed during the hospital encounter of 09/04/16 (from the past 48 hour(s))  CBC with Differential/Platelet     Status: Abnormal   Collection Time: 09/04/16  3:49 AM  Result Value Ref Range   WBC 6.6 4.0 - 10.5 K/uL   RBC 4.13 (L) 4.22 - 5.81 MIL/uL   Hemoglobin 11.2 (L) 13.0 - 17.0 g/dL   HCT 34.9 (L) 39.0 - 52.0 %   MCV 84.5 78.0 - 100.0 fL   MCH 27.1 26.0 - 34.0 pg   MCHC 32.1 30.0 - 36.0 g/dL   RDW 13.4 11.5 - 15.5 %   Platelets 305 150 - 400 K/uL   Neutrophils Relative % 51 %   Neutro Abs 3.4 1.7 - 7.7 K/uL   Lymphocytes Relative 30 %   Lymphs Abs 2.0 0.7 - 4.0 K/uL   Monocytes Relative 10 %   Monocytes Absolute 0.7 0.1 - 1.0 K/uL   Eosinophils Relative 8 %   Eosinophils Absolute 0.6 0.0 - 0.7 K/uL   Basophils Relative 1 %   Basophils Absolute 0.0 0.0 - 0.1 K/uL  Comprehensive metabolic panel     Status: None   Collection Time: 09/04/16  3:49 AM  Result Value Ref Range   Sodium 140 135 - 145 mmol/L   Potassium 3.9 3.5 - 5.1 mmol/L   Chloride 104 101 - 111 mmol/L   CO2 24 22 - 32 mmol/L   Glucose, Bld 88 65 - 99 mg/dL   BUN 11 6 - 20 mg/dL   Creatinine, Ser 1.01 0.61 - 1.24 mg/dL   Calcium 9.0 8.9 - 10.3 mg/dL   Total Protein 7.2 6.5 - 8.1 g/dL   Albumin 3.6 3.5 - 5.0 g/dL   AST 23 15 - 41 U/L   ALT 33 17 - 63 U/L   Alkaline Phosphatase 43 38 - 126 U/L   Total Bilirubin 0.4 0.3 - 1.2 mg/dL   GFR calc non Af Amer >60 >60 mL/min   GFR calc Af Amer >60 >60 mL/min    Comment: (NOTE) The eGFR has  been calculated using the CKD EPI equation. This calculation has not been validated in all clinical situations. eGFR's persistently <60 mL/min signify possible Chronic Kidney Disease.    Anion gap 12 5 - 15  Ethanol     Status: Abnormal   Collection Time: 09/04/16  3:49 AM  Result Value Ref Range   Alcohol, Ethyl (B) 103 (H) <5 mg/dL    Comment:        LOWEST DETECTABLE LIMIT FOR SERUM ALCOHOL IS 5  mg/dL FOR MEDICAL PURPOSES ONLY   Rapid urine drug screen (hospital performed)     Status: None   Collection Time: 09/04/16  3:55 AM  Result Value Ref Range   Opiates NONE DETECTED NONE DETECTED   Cocaine NONE DETECTED NONE DETECTED   Benzodiazepines NONE DETECTED NONE DETECTED   Amphetamines NONE DETECTED NONE DETECTED   Tetrahydrocannabinol NONE DETECTED NONE DETECTED   Barbiturates NONE DETECTED NONE DETECTED    Comment:        DRUG SCREEN FOR MEDICAL PURPOSES ONLY.  IF CONFIRMATION IS NEEDED FOR ANY PURPOSE, NOTIFY LAB WITHIN 5 DAYS.        LOWEST DETECTABLE LIMITS FOR URINE DRUG SCREEN Drug Class       Cutoff (ng/mL) Amphetamine      1000 Barbiturate      200 Benzodiazepine   426 Tricyclics       834 Opiates          300 Cocaine          300 THC              50     No current facility-administered medications for this encounter.    Current Outpatient Prescriptions  Medication Sig Dispense Refill  . acetaminophen 325 MG tablet Take 2 tablets (650 mg total) by mouth every 4 (four) hours as needed. 40 tablet 0  . benztropine (COGENTIN) 0.5 MG tablet Take 1 tablet (0.5 mg total) by mouth 2 (two) times daily. 60 tablet 0  . cloNIDine HCl (KAPVAY) 0.1 MG TB12 ER tablet Take 1 tablet (0.1 mg total) by mouth at bedtime. 30 tablet 0  . pravastatin (PRAVACHOL) 20 MG tablet Take 20 mg by mouth at bedtime.    . risperiDONE (RISPERDAL) 1 MG tablet Take 1 tablet (1 mg total) by mouth every morning. 30 tablet 0  . risperiDONE (RISPERDAL) 3 MG tablet Take 1 tablet (3 mg total) by  mouth at bedtime. 30 tablet 0  . Rivaroxaban (XARELTO) 15 MG TABS tablet Take 15 mg by mouth 2 (two) times daily.     . rivaroxaban (XARELTO) 20 MG TABS tablet Take 1 tablet (20 mg total) by mouth daily with supper. 30 tablet 0  . traZODone (DESYREL) 50 MG tablet Take 1 tablet (50 mg total) by mouth at bedtime and may repeat dose one time if needed. 30 tablet 0    Musculoskeletal: Unable to assess on camera  Psychiatric Specialty Exam: Physical Exam  Psychiatric: He has a normal mood and affect. His speech is normal and behavior is normal. Judgment and thought content normal. Cognition and memory are normal.    Review of Systems  Constitutional: Negative.   HENT: Negative.   Eyes: Negative.   Respiratory: Negative.   Cardiovascular: Negative.   Gastrointestinal: Negative.   Genitourinary: Negative.   Musculoskeletal: Negative.   Skin: Negative.   Neurological: Negative.   Endo/Heme/Allergies: Negative.   Psychiatric/Behavioral: Positive for hallucinations and substance abuse (positive for alcohol). Negative for depression, memory loss and suicidal ideas. The patient is not nervous/anxious and does not have insomnia.   All other systems reviewed and are negative.   Blood pressure 109/72, pulse 94, temperature 98.4 F (36.9 C), temperature source Oral, resp. rate 18, SpO2 95 %.There is no height or weight on file to calculate BMI.  General Appearance: Casual  Eye Contact:  Good  Speech:  Normal Rate  Volume:  Normal  Mood:  Irritable, mild  Affect:  Congruent  Thought Process:  Coherent and Descriptions of Associations: Intact  Orientation:  Full (Time, Place, and Person)  Thought Content:  Hallucinations: Auditory  Suicidal Thoughts:  No  Homicidal Thoughts:  No  Memory:  Immediate;   Good Recent;   Good Remote;   Good  Judgement:  Fair  Insight:  Fair  Psychomotor Activity:  Normal  Concentration:  Concentration: Good and Attention Span: Good  Recall:  Good  Fund of  Knowledge:  Fair  Language:  Good  Akathisia:  No  Handed:  Right  AIMS (if indicated):     Assets:  Communication Skills Desire for Improvement Physical Health Resilience  ADL's:  Intact  Cognition:  WNL  Sleep:        Treatment Plan Summary:   Patient appears stable for discharge at this time. He already has outpatient follow up scheduled tomorrow at [facility] at 2 pm and patient appears motivated to follow up in order to see Psychiatrist. Will continue with Risperdal at this time as patient recently discharged on this medication on 08/30/2016. Further changes can be discussed with his outpatient provider.    Disposition: No evidence of imminent risk to self or others at present.   Patient does not meet criteria for psychiatric inpatient admission. Supportive therapy provided about ongoing stressors.  Ethelene Hal, NP 09/04/2016 10:06 AM   Agree with NP assessment as above

## 2016-09-04 NOTE — ED Notes (Signed)
Pt escorted out of ED by security and off-duty [employer] Officer.

## 2016-09-06 ENCOUNTER — Encounter (HOSPITAL_COMMUNITY): Payer: Self-pay | Admitting: Emergency Medicine

## 2016-09-06 ENCOUNTER — Ambulatory Visit (HOSPITAL_COMMUNITY)
Admission: RE | Admit: 2016-09-06 | Discharge: 2016-09-06 | Disposition: A | Discharge status: Home / Self Care | Payer: Medicaid Other | Attending: Psychiatry | Admitting: Psychiatry

## 2016-09-06 ENCOUNTER — Emergency Department (HOSPITAL_COMMUNITY)
Admission: EM | Admit: 2016-09-06 | Discharge: 2016-09-06 | Disposition: A | Discharge status: Home / Self Care | Payer: Medicaid Other | Attending: Emergency Medicine | Admitting: Emergency Medicine

## 2016-09-06 ENCOUNTER — Emergency Department (HOSPITAL_COMMUNITY)
Admission: EM | Admit: 2016-09-06 | Discharge: 2016-09-07 | Disposition: A | Discharge status: Home / Self Care | Payer: Medicaid Other | Attending: Emergency Medicine | Admitting: Emergency Medicine

## 2016-09-06 ENCOUNTER — Encounter: Payer: Self-pay | Admitting: Oncology

## 2016-09-06 DIAGNOSIS — Z86711 Personal history of pulmonary embolism: Secondary | ICD-10-CM | POA: Diagnosis not present

## 2016-09-06 DIAGNOSIS — Z590 Homelessness: Secondary | ICD-10-CM | POA: Insufficient documentation

## 2016-09-06 DIAGNOSIS — R440 Auditory hallucinations: Secondary | ICD-10-CM | POA: Insufficient documentation

## 2016-09-06 DIAGNOSIS — R451 Restlessness and agitation: Secondary | ICD-10-CM | POA: Insufficient documentation

## 2016-09-06 DIAGNOSIS — F1290 Cannabis use, unspecified, uncomplicated: Secondary | ICD-10-CM | POA: Insufficient documentation

## 2016-09-06 DIAGNOSIS — F209 Schizophrenia, unspecified: Secondary | ICD-10-CM | POA: Insufficient documentation

## 2016-09-06 DIAGNOSIS — F17210 Nicotine dependence, cigarettes, uncomplicated: Secondary | ICD-10-CM | POA: Insufficient documentation

## 2016-09-06 DIAGNOSIS — F600 Paranoid personality disorder: Secondary | ICD-10-CM | POA: Insufficient documentation

## 2016-09-06 DIAGNOSIS — E7800 Pure hypercholesterolemia, unspecified: Secondary | ICD-10-CM | POA: Insufficient documentation

## 2016-09-06 DIAGNOSIS — Z79899 Other long term (current) drug therapy: Secondary | ICD-10-CM | POA: Insufficient documentation

## 2016-09-06 DIAGNOSIS — Z7901 Long term (current) use of anticoagulants: Secondary | ICD-10-CM | POA: Insufficient documentation

## 2016-09-06 DIAGNOSIS — R443 Hallucinations, unspecified: Secondary | ICD-10-CM

## 2016-09-06 DIAGNOSIS — F419 Anxiety disorder, unspecified: Secondary | ICD-10-CM | POA: Insufficient documentation

## 2016-09-06 DIAGNOSIS — F4321 Adjustment disorder with depressed mood: Secondary | ICD-10-CM | POA: Diagnosis present

## 2016-09-06 LAB — CBC
HEMATOCRIT: 35.2 % — AB (ref 39.0–52.0)
HEMATOCRIT: 35.4 % — AB (ref 39.0–52.0)
HEMOGLOBIN: 11.3 g/dL — AB (ref 13.0–17.0)
Hemoglobin: 11.9 g/dL — ABNORMAL LOW (ref 13.0–17.0)
MCH: 27.3 pg (ref 26.0–34.0)
MCH: 27.4 pg (ref 26.0–34.0)
MCHC: 32.1 g/dL (ref 30.0–36.0)
MCHC: 33.6 g/dL (ref 30.0–36.0)
MCV: 85 fL (ref 78.0–100.0)
MCV: 81.4 fL (ref 78.0–100.0)
Platelets: 303 10*3/uL (ref 150–400)
Platelets: 331 10*3/uL (ref 150–400)
RBC: 4.14 MIL/uL — AB (ref 4.22–5.81)
RBC: 4.35 MIL/uL (ref 4.22–5.81)
RDW: 13.4 % (ref 11.5–15.5)
RDW: 13.3 % (ref 11.5–15.5)
WBC: 6.3 10*3/uL (ref 4.0–10.5)
WBC: 5.3 10*3/uL (ref 4.0–10.5)

## 2016-09-06 LAB — COMPREHENSIVE METABOLIC PANEL
ALBUMIN: 3.6 g/dL (ref 3.5–5.0)
ALBUMIN: 4 g/dL (ref 3.5–5.0)
ALT: 34 U/L (ref 17–63)
ALT: 41 U/L (ref 17–63)
ANION GAP: 10 (ref 5–15)
AST: 24 U/L (ref 15–41)
AST: 29 U/L (ref 15–41)
Alkaline Phosphatase: 49 U/L (ref 38–126)
Alkaline Phosphatase: 48 U/L (ref 38–126)
Anion gap: 8 (ref 5–15)
BILIRUBIN TOTAL: 0.4 mg/dL (ref 0.3–1.2)
BUN: 7 mg/dL (ref 6–20)
BUN: 13 mg/dL (ref 6–20)
CHLORIDE: 107 mmol/L (ref 101–111)
CO2: 23 mmol/L (ref 22–32)
CO2: 23 mmol/L (ref 22–32)
Calcium: 8.8 mg/dL — ABNORMAL LOW (ref 8.9–10.3)
Calcium: 9 mg/dL (ref 8.9–10.3)
Chloride: 104 mmol/L (ref 101–111)
Creatinine, Ser: 0.87 mg/dL (ref 0.61–1.24)
Creatinine, Ser: 1.06 mg/dL (ref 0.61–1.24)
GFR calc Af Amer: >60 mL/min (ref >60)
GFR calc Af Amer: >60 mL/min (ref >60)
GFR calc non Af Amer: >60 mL/min (ref >60)
GFR calc non Af Amer: >60 mL/min (ref >60)
GLUCOSE: 96 mg/dL (ref 65–99)
GLUCOSE: 98 mg/dL (ref 65–99)
POTASSIUM: 3.5 mmol/L (ref 3.5–5.1)
POTASSIUM: 3.8 mmol/L (ref 3.5–5.1)
SODIUM: 137 mmol/L (ref 135–145)
SODIUM: 138 mmol/L (ref 135–145)
TOTAL PROTEIN: 7.9 g/dL (ref 6.5–8.1)
Total Bilirubin: 0.3 mg/dL (ref 0.3–1.2)
Total Protein: 7.3 g/dL (ref 6.5–8.1)

## 2016-09-06 LAB — ETHANOL
Alcohol, Ethyl (B): 66 mg/dL — ABNORMAL HIGH (ref <5)
Alcohol, Ethyl (B): <5 mg/dL (ref <5)

## 2016-09-06 LAB — SALICYLATE LEVEL: Salicylate Lvl: <7 mg/dL (ref 2.8–30.0)

## 2016-09-06 LAB — RAPID URINE DRUG SCREEN, HOSP PERFORMED
AMPHETAMINES: NOT DETECTED
BARBITURATES: NOT DETECTED
Benzodiazepines: NOT DETECTED
Cocaine: NOT DETECTED
Opiates: NOT DETECTED
TETRAHYDROCANNABINOL: NOT DETECTED

## 2016-09-06 LAB — ACETAMINOPHEN LEVEL

## 2016-09-06 MED ORDER — LORAZEPAM 1 MG PO TABS: 1.0000 mg | ORAL_TABLET | Freq: Three times a day (TID) | ORAL | Status: DC | PRN

## 2016-09-06 MED ORDER — RIVAROXABAN 20 MG PO TABS
20.0000 mg | ORAL_TABLET | Freq: Every day | ORAL | Status: DC
  Administered 2016-09-07: 20 mg via ORAL
  Filled 2016-09-06: qty 1

## 2016-09-06 MED ORDER — PRAVASTATIN SODIUM 20 MG PO TABS
20.0000 mg | ORAL_TABLET | Freq: Every day | ORAL | Status: DC
  Administered 2016-09-06: 20 mg via ORAL
  Filled 2016-09-06: qty 1

## 2016-09-06 MED ORDER — CLONIDINE HCL ER 0.1 MG PO TB12
0.1000 mg | ORAL_TABLET | Freq: Every day | ORAL | Status: DC
  Administered 2016-09-06: 0.1 mg via ORAL
  Filled 2016-09-06: qty 1

## 2016-09-06 MED ORDER — RIVAROXABAN 15 MG PO TABS
15.0000 mg | ORAL_TABLET | Freq: Two times a day (BID) | ORAL | Status: DC
  Filled 2016-09-06: qty 1

## 2016-09-06 MED ORDER — BENZTROPINE MESYLATE 0.5 MG PO TABS
0.5000 mg | ORAL_TABLET | Freq: Two times a day (BID) | ORAL | Status: DC
  Administered 2016-09-06 – 2016-09-07 (×2): 0.5 mg via ORAL
  Filled 2016-09-06 (×2): qty 1

## 2016-09-06 MED ORDER — ONDANSETRON HCL 4 MG PO TABS: 4.0000 mg | ORAL_TABLET | Freq: Three times a day (TID) | ORAL | Status: DC | PRN

## 2016-09-06 MED ORDER — RISPERIDONE 1 MG PO TABS
1.0000 mg | ORAL_TABLET | Freq: Every morning | ORAL | Status: DC
  Administered 2016-09-07: 1 mg via ORAL
  Filled 2016-09-06: qty 1

## 2016-09-06 MED ORDER — IBUPROFEN 200 MG PO TABS: 600.0000 mg | ORAL_TABLET | Freq: Three times a day (TID) | ORAL | Status: DC | PRN

## 2016-09-06 MED ORDER — ALUM & MAG HYDROXIDE-SIMETH 200-200-20 MG/5ML PO SUSP: 30.0000 mL | ORAL | Status: DC | PRN

## 2016-09-06 MED ORDER — TRAZODONE HCL 50 MG PO TABS: 50.0000 mg | ORAL_TABLET | Freq: Every evening | ORAL | Status: DC | PRN

## 2016-09-06 MED ORDER — ACETAMINOPHEN 325 MG PO TABS: 650.0000 mg | ORAL_TABLET | ORAL | Status: DC | PRN

## 2016-09-06 MED ORDER — ZOLPIDEM TARTRATE 5 MG PO TABS: 5.0000 mg | ORAL_TABLET | Freq: Every evening | ORAL | Status: DC | PRN

## 2016-09-06 MED ORDER — RISPERIDONE 2 MG PO TABS
3.0000 mg | ORAL_TABLET | Freq: Every day | ORAL | Status: DC
  Administered 2016-09-06: 3 mg via ORAL
  Filled 2016-09-06: qty 1

## 2016-09-06 MED ORDER — NICOTINE 21 MG/24HR TD PT24: 21.0000 mg | MEDICATED_PATCH | Freq: Every day | TRANSDERMAL | Status: DC

## 2016-09-06 NOTE — ED Triage Notes (Signed)
Pt presents to ED for assessment of auditory hallucinations "saying they gonna kill me".  Pt sts no hx of same.  Denies SI/HI.  Denies visual hallucinations.  States voices started tonight while he was eating mac and cheese.

## 2016-09-06 NOTE — ED Provider Notes (Signed)
[facility]-EMERGENCY DEPT Provider Note   CSN: [id number] Arrival date & time: 09/06/16  0151     History   Chief Complaint Chief Complaint  Patient presents with  . Hallucinations    HPI Cole Barrett is a 30 y.o. male with a past medical history of homelessness, paranoid behavior, schizophrenia, presenting today with auditory hallucinations. Patient states this is been going on for the past month. On multiple occasions he hears people talking to him saying they're going to kill him. He denies any suicidal or homicidal ideations. He states he is compliant with his psych medications do not help with voices. He is requesting to go to behavioral health Hospital or [place]. He has no medical complaints, no fevers, nausea vomiting diarrhea, no cough, no urinary symptoms. There are no further complaints.  10 Systems reviewed and are negative for acute change except as noted in the HPI.    HPI  Past Medical History:  Diagnosis Date  . Homelessness   . Hypercholesterolemia   . Mental disorder   . Paranoid behavior ([facility])   . PE (pulmonary embolism)   . Schizophrenia [facility])     Patient Active Problem List   Diagnosis Date Noted  . Homelessness 09/03/2016  . Person feigning illness 09/03/2016  . 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 Cannabis use disorder, mild, abuse 08/29/2016  . Paranoid schizophrenia ([facility]) 08/26/2016  . Pulmonary emboli ([facility]) 07/13/2016  . Adjustment disorder with anxiety 03/12/2014  . 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 Acute left flank pain 01/02/2014  . Pulmonary embolism, bilateral ([facility]) 01/02/2014    History reviewed. No pertinent surgical history.     Home Medications    Prior to Admission medications   Medication Sig Start Date End Date Taking? Authorizing Provider  acetaminophen 325 MG tablet Take 2 tablets (650 mg total) by mouth every 4 (four) hours as needed. 09/01/16   [name], MD  benztropine (COGENTIN) 0.5 MG  tablet Take 1 tablet (0.5 mg total) by mouth 2 (two) times daily. 08/30/16   Adonis Brook, NP  cloNIDine HCl (KAPVAY) 0.1 MG TB12 ER tablet Take 1 tablet (0.1 mg total) by mouth at bedtime. 08/30/16   Adonis Brook, NP  pravastatin (PRAVACHOL) 20 MG tablet Take 20 mg by mouth at bedtime.    Historical Provider, MD  risperiDONE (RISPERDAL) 1 MG tablet Take 1 tablet (1 mg total) by mouth every morning. 08/31/16   Adonis Brook, NP  risperiDONE (RISPERDAL) 3 MG tablet Take 1 tablet (3 mg total) by mouth at bedtime. 08/30/16   Adonis Brook, NP  risperiDONE (RISPERDAL) 3 MG tablet Take 1 tablet (3 mg total) by mouth at bedtime. 09/04/16 09/05/16  Lyndal Pulley, MD  Rivaroxaban (XARELTO) 15 MG TABS tablet Take 15 mg by mouth 2 (two) times daily.     Historical Provider, MD  rivaroxaban (XARELTO) 20 MG TABS tablet Take 1 tablet (20 mg total) by mouth daily with supper. 08/30/16   Adonis Brook, NP  traZODone (DESYREL) 50 MG tablet Take 1 tablet (50 mg total) by mouth at bedtime and may repeat dose one time if needed. 08/30/16   Adonis Brook, NP    Family History History reviewed. No pertinent family history.  Social History Social History  Substance Use Topics  . Smoking status: Current Every Day Smoker    Packs/day: 0.25    Years: 4.00    Types: Cigarettes  . Smokeless tobacco: Never Used  . Alcohol use Yes  Comment: 1 40 oz per day     Allergies   Haldol [haloperidol]   Review of Systems Review of Systems   Physical Exam Updated Vital Signs BP 138/80 (BP Location: Right Arm)   Pulse 97   Temp 98.1 F (36.7 C) (Oral)   Resp 18   Ht 6' (1.829 m)   Wt 226 lb 6 oz (102.7 kg)   SpO2 97%   BMI 30.70 kg/m   Physical Exam  Constitutional: He is oriented to person, place, and time. Vital signs are normal. He appears well-developed and well-nourished.  Non-toxic appearance. He does not appear ill. No distress.  Sleeping comfortably in the room.  HENT:  Head: Normocephalic and  atraumatic.  Nose: Nose normal.  Mouth/Throat: Oropharynx is clear and moist. No oropharyngeal exudate.  Eyes: Conjunctivae and EOM are normal. Pupils are equal, round, and reactive to light. No scleral icterus.  Neck: Normal range of motion. Neck supple. No tracheal deviation, no edema, no erythema and normal range of motion present. No thyroid mass and no thyromegaly present.  Cardiovascular: Normal rate, regular rhythm, S1 normal, S2 normal, normal heart sounds, intact distal pulses and normal pulses.  Exam reveals no gallop and no friction rub.   No murmur heard. Pulmonary/Chest: Effort normal and breath sounds normal. No respiratory distress. He has no wheezes. He has no rhonchi. He has no rales.  Abdominal: Soft. Normal appearance and bowel sounds are normal. He exhibits no distension, no ascites and no mass. There is no hepatosplenomegaly. There is no tenderness. There is no rebound, no guarding and no CVA tenderness.  Musculoskeletal: Normal range of motion. He exhibits no edema or tenderness.  Lymphadenopathy:    He has no cervical adenopathy.  Neurological: He is alert and oriented to person, place, and time. He has normal strength. No cranial nerve deficit or sensory deficit.  Skin: Skin is warm, dry and intact. No petechiae and no rash noted. He is not diaphoretic. No erythema. No pallor.  Psychiatric:  No suicidal or homicidal ideations.  Nursing note and vitals reviewed.    ED Treatments / Results  Labs (all labs ordered are listed, but only abnormal results are displayed) Labs Reviewed  COMPREHENSIVE METABOLIC PANEL - Abnormal; Notable for the following:       Result Value   Calcium 8.8 (*)    All other components within normal limits  ETHANOL - Abnormal; Notable for the following:    Alcohol, Ethyl (B) 66 (*)    All other components within normal limits  CBC - Abnormal; Notable for the following:    RBC 4.14 (*)    Hemoglobin 11.3 (*)    HCT 35.2 (*)    All other  components within normal limits  RAPID URINE DRUG SCREEN, HOSP PERFORMED    EKG  EKG Interpretation None       Radiology No results found.  Procedures Procedures (including critical care time)  Medications Ordered in ED Medications - No data to display   Initial Impression / Assessment and Plan / ED Course  I have reviewed the triage vital signs and the nursing notes.  Pertinent labs & imaging results that were available during my care of the patient were reviewed by me and considered in my medical decision making (see chart for details).  Clinical Course    Patient presents emergency department for auditory hallucinations. He denies any suicidal or homicidal ideations. We'll give her resources and follow-up to behavioral health for outpatient treatment. Patient  also requesting sandwich and drink.  Will provide if available. He appears well and in no acute distress, vital signs were within his normal limits and he is safe for discharge.  Final Clinical Impressions(s) / ED Diagnoses   Final diagnoses:  Auditory hallucination    New Prescriptions New Prescriptions   No medications on file     Tomasita CrumbleAdeleke Sherby Moncayo, MD 09/06/16 [phone]

## 2016-09-06 NOTE — ED Triage Notes (Signed)
Patient reports he has been having auditory hallucinations for 2 weeks stating they are going to kill him. Denies visual hallucinations. Denies SI/HI.

## 2016-09-06 NOTE — ED Notes (Signed)
Bed: WTR6 Expected date:  Expected time:  Means of arrival:  Comments: 

## 2016-09-06 NOTE — ED Notes (Signed)
Pt returned to the ED because he continued to hear voice and feel suicidal. He is calm and cooperative with flat affect. He likes to keep his light on in his room while. Nutrition offered.

## 2016-09-06 NOTE — H&P (Signed)
Behavioral Health Medical Screening Exam  Cole Barrett is an 30 y.o. male.  Total Time spent with patient: 30 minutes  Psychiatric Specialty Exam: Physical Exam  Nursing note and vitals reviewed. Psychiatric: His mood appears anxious. His affect is labile. His speech is tangential. Thought content is paranoid. Thought content is not delusional. Cognition and memory are impaired. He does not express impulsivity or inappropriate judgment. He exhibits a depressed mood. He expresses suicidal ideation.    Review of Systems  Constitutional: Negative.   HENT: Negative.   Eyes: Negative.   Respiratory: Negative.   Cardiovascular: Negative.   Gastrointestinal: Negative.   Genitourinary: Negative.   Musculoskeletal: Negative.   Skin: Negative.   Neurological: Negative.   Endo/Heme/Allergies: Negative.   Psychiatric/Behavioral: Positive for depression. The patient is nervous/anxious.     There were no vitals taken for this visit.There is no height or weight on file to calculate BMI.  General Appearance: Casual  Eye Contact:  Minimal  Speech:  Garbled and Pressured  Volume:  Normal  Mood:  Anxious and Depressed  Affect:  Depressed, Flat and Labile  Thought Process:  Disorganized  Orientation:  Full (Time, Place, and Person)  Thought Content:  Rumination  Suicidal Thoughts:  Yes.  without intent/plan  Homicidal Thoughts:  No  Memory:  Immediate;   Fair Recent;   Fair Remote;   Fair  Judgement:  Fair  Insight:  Fair  Psychomotor Activity:  Normal  Concentration: Concentration: Fair and Attention Span: Good  Recall:  Good  Fund of Knowledge:Fair  Language: Good  Akathisia:  Negative  Handed:  Right  AIMS (if indicated):     Assets:  Physical Health Resilience  Sleep:       Musculoskeletal: Strength & Muscle Tone: within normal limits Gait & Station: normal Patient leans: N/A  Vital signs: 98.1 Temp 97 P 133/75 BP 99 O2 sat  Recommendations:    Based on my  evaluation the patient does not appear to have an emergency medical condition. Patient was slow in repsonding to questions.  He states that he was just here inpatient but after 2 days the voices came back stronger and he started hearing the voices again to "kill, kill, kill."  Lindwood QuaSheila May Agustin, NP [facility] 09/06/2016, 4:26 PM   Agree with NP note as above

## 2016-09-06 NOTE — ED Notes (Signed)
Patient dressed in purple scrubs. Patient and belongings wanded by security.

## 2016-09-06 NOTE — Discharge Instructions (Signed)
Go to behavioral health hospital for further care.  If symptoms worsen, come back to the ED immediately. Thank you.

## 2016-09-06 NOTE — BH Assessment (Signed)
Tele Assessment Note  [facility]. Per chart review, pt was d/c from [facility] at 0700 today. Pt has presented to the ED several times over the past month. Pt reports he [facility] be [facility] b/c of AH "saying they gonna kill me." Pt sts that the voices quieted down for a couple of days after he was [facility], but he says the voices then returned. Pt sts the voices also stopped for a couple of days after he got out of the inpatient psych facility in [place]. Pt sts he is having trouble figuring out which psych meds to take. Pt sts he is still [facility] [place].  Per chart review, pt [facility] and was d/c 08/30/16 for schizophrenia. Pt had 09/05/16 [facility] appt. Pt is dressed in street clothes appropriate for weather. He is oriented x 4. Pt is poor historian as he appears to struggle with choosing words when he denies SI and HI. There may be some thought blocking. Pt sts that he is experiencing AH during assessment. No delusions noted.  Pt reports no prior suicide attempts. Pt states that he has been taking Risperdal for approx 2 months and [place] the [place] after staying in an apparent psychiatric facility up there for 2 weeks. Pt was unclear as to his mental health diagnosis. He reports the facility (doesn't know name) sent him to [place]. Pt sts he doesn't have any family or friends in the area. He has court date 09/23/16 for public urination. Pt sts his mom and dad raised him. Pt appears confused when writer asks where parents are now or if they are living.   Cole Barrett is an 30 y.o. male.   Diagnosis: Schizophrenia  Past Medical History:  Past Medical History:  Diagnosis Date  . Homelessness   . Hypercholesterolemia   . Mental disorder   . Paranoid behavior (HCC)   . PE (pulmonary embolism)   . Schizophrenia (HCC)     No past surgical  history on file.  Family History: No family history on file.  Social History:  reports that he has been smoking Cigarettes.  He has a 1.00 pack-year smoking history. He has never used smokeless tobacco. He reports that he drinks alcohol. He reports that he uses drugs, including Marijuana.  Additional Social History:  Alcohol / Drug Use Pain Medications: pt denies abuse - see pta meds list Prescriptions: pt denies abuse - see pta meds list Over the Counter: pt denies abuse - see pta meds list History of alcohol / drug use?: Yes Substance #1 Name of Substance 1: etoh 1 - Amount (size/oz): two 40 oz beers 1 - Frequency: every other day 1 - Last Use / Amount: 09/05/16 - 40 oz beer Substance #2 Name of Substance 2: cannabis 2 - Frequency: every two or three months 2 - Last Use / Amount: unknown  CIWA:   COWS:    PATIENT STRENGTHS: (choose at least two) Average or above average intelligence Capable of independent living Communication skills Physical Health  Allergies:  Allergies  Allergen Reactions  . Haldol [Haloperidol] Shortness Of Breath    Home Medications:  (Not in a hospital admission)  OB/GYN Status:  No LMP for male patient.  General Assessment Data [facility] TTS Assessment: In system Is this a Tele or Face-to-Face Assessment?: Face-to-Face Is  this an Initial Assessment or a Re-assessment for this encounter?: Initial Assessment Marital status: Single Maiden name: none Is patient pregnant?: No Pregnancy Status: No Living Arrangements:  (alone) Can pt return to current living arrangement?: Yes Admission Status: Voluntary Is patient capable of signing voluntary admission?: Yes Referral Source: Self/Family/Friend Insurance type: Geophysical data processor Exam [facility] Walk-in ONLY) Medical Exam completed: Yes  Crisis Care Plan Living Arrangements:  (alone) Name of Psychiatrist: none Name of Therapist:  none  Education Status Is patient currently in school?: No Highest grade of school patient has completed: 12  Risk to self with the past 6 months Suicidal Ideation: No Has patient been a risk to self within the past 6 months prior to admission? : No Suicidal Intent: No Has patient had any suicidal intent within the past 6 months prior to admission? : No Is patient at risk for suicide?: No Suicidal Plan?: No Has patient had any suicidal plan within the past 6 months prior to admission? : No Access to Means: No What has been your use of drugs/alcohol within the last 12 months?: etoh every other day, THC every 2-3 mos Previous Attempts/Gestures: No How many times?: 0 Other Self Harm Risks: none Triggers for Past Attempts:  (n/a) Intentional Self Injurious Behavior: None Family Suicide History: Unknown Recent stressful life event(s): Other (Comment) (AH) Persecutory voices/beliefs?: No Depression: No Substance abuse history and/or treatment for substance abuse?: No Suicide prevention information given to non-admitted patients: Not applicable  Risk to Others within the past 6 months Homicidal Ideation: No Does patient have any lifetime risk of violence toward others beyond the six months prior to admission? : No Thoughts of Harm to Others: No Current Homicidal Intent: No Current Homicidal Plan: No Access to Homicidal Means: No Identified Victim: none History of harm to others?: No Assessment of Violence: None Noted Violent Behavior Description: pt denies hx violence Does patient have access to weapons?: No Criminal Charges Pending?: Yes Describe Pending Criminal Charges: urinate in public Does patient have a court date: Yes Court Date: 09/23/16 Is patient on probation?: No  Psychosis Hallucinations: Auditory Delusions: None noted  Mental Status Report Appearance/Hygiene: Disheveled (in street clothes appropriate for weather) Eye Contact: Good Motor Activity: Freedom of  movement Speech: Logical/coherent Level of Consciousness: Quiet/awake, Alert Mood: Anxious Affect: Blunted, Preoccupied Anxiety Level: Moderate Thought Processes: Relevant, Coherent Judgement: Impaired Orientation: Person, Place, Time, Situation Obsessive Compulsive Thoughts/Behaviors: None  Cognitive Functioning Concentration: Decreased Memory: Recent Impaired, Remote Impaired IQ: Average Insight: Poor Impulse Control: Fair Appetite: Fair Sleep: No Change Vegetative Symptoms: None  ADLScreening [facility] Assessment Services) Patient's cognitive ability adequate to safely complete daily activities?: Yes Patient able to express need for assistance with ADLs?: Yes Independently performs ADLs?: Yes (appropriate for developmental age)  Prior Inpatient Therapy Prior Inpatient Therapy: Yes Prior Therapy Dates: 2017 Prior Therapy Facilty/Provider(s): [facility] Reason for Treatment: psychosis  Prior Outpatient Therapy Prior Outpatient Therapy: No Does patient have an ACCT team?: No Does patient have Intensive In-House Services?  : No Does patient have [facility] services? : Unknown Does patient have P4CC services?: Unknown  ADL Screening (condition at time of admission) Patient's cognitive ability adequate to safely complete daily activities?: Yes Is the patient deaf or have difficulty hearing?: No Does the patient have difficulty seeing, even when wearing glasses/contacts?: No Does the patient have difficulty concentrating, remembering, or making decisions?: Yes Patient able to express need for assistance with ADLs?: Yes Does the patient  have difficulty dressing or bathing?: No Independently performs ADLs?: Yes (appropriate for developmental age) Does the patient have difficulty walking or climbing stairs?: No Weakness of Legs: None Weakness of Arms/Hands: None  Home Assistive Devices/Equipment Home Assistive Devices/Equipment: None    Abuse/Neglect  Assessment (Assessment to be complete while patient is alone) Physical Abuse: Denies Verbal Abuse: Denies Sexual Abuse: Denies Exploitation of patient/patient's resources: Denies Self-Neglect: Denies     Merchant navy officer (For Healthcare) Does patient have an advance directive?: No Would patient like information on creating an advanced directive?: No - patient declined information    Additional Information 1:1 In Past 12 Months?: No CIRT Risk: No Elopement Risk: No Does patient have medical clearance?: No     Disposition:  Disposition Initial Assessment Completed for this Encounter: Yes Disposition of Patient: Inpatient treatment program Type of inpatient treatment program: Adult (may agustin NP recommends inpatient treatment)   Pt sent to [facility] for medical clearance.  Maysie Parkhill P 09/06/2016 4:54 PM

## 2016-09-06 NOTE — ED Provider Notes (Signed)
[facility]-EMERGENCY DEPT Provider Note   CSN: [id number] Arrival date & time: 09/06/16  1618     History   Chief Complaint Chief Complaint  Patient presents with  . Medical Clearance    HPI KIMANI HOVIS is a 30 y.o. male.  HPI ROMAR WOODRICK is a 30 y.o. male with history of schizophrenia, paranoid behavior, pulmonary embolism, presents to emergency department complaining hallucinations. Patient is hearing voices, states voices say that they wanted to kill him. Pt with multiple visits here for the same. States he is compliant with medications. Denies SI or HI. Was see here early this morning and multiple times in the last few weeks.  States " I cant take this any more." Denies any other complaints. Has hx of PE but denies cp or sob at this time. On Xarelto.   Past Medical History:  Diagnosis Date  . Homelessness   . Hypercholesterolemia   . Mental disorder   . Paranoid behavior (HCC)   . PE (pulmonary embolism)   . Schizophrenia [facility])     Patient Active Problem List   Diagnosis Date Noted  . Homelessness 09/03/2016  . Person feigning illness 09/03/2016  . 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 Cannabis use disorder, mild, abuse 08/29/2016  . Paranoid schizophrenia (HCC) 08/26/2016  . Pulmonary emboli (HCC) 07/13/2016  . Adjustment disorder with anxiety 03/12/2014  . 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 Acute left flank pain 01/02/2014  . Pulmonary embolism, bilateral (HCC) 01/02/2014    History reviewed. No pertinent surgical history.     Home Medications    Prior to Admission medications   Medication Sig Start Date End Date Taking? Authorizing Provider  acetaminophen 325 MG tablet Take 2 tablets (650 mg total) by mouth every 4 (four) hours as needed. 09/01/16   [name], MD  benztropine (COGENTIN) 0.5 MG tablet Take 1 tablet (0.5 mg total) by mouth 2 (two) times daily. 08/30/16   Adonis Brook, NP  cloNIDine HCl (KAPVAY) 0.1 MG  TB12 ER tablet Take 1 tablet (0.1 mg total) by mouth at bedtime. 08/30/16   Adonis Brook, NP  pravastatin (PRAVACHOL) 20 MG tablet Take 20 mg by mouth at bedtime.    Historical Provider, MD  risperiDONE (RISPERDAL) 1 MG tablet Take 1 tablet (1 mg total) by mouth every morning. 08/31/16   Adonis Brook, NP  risperiDONE (RISPERDAL) 3 MG tablet Take 1 tablet (3 mg total) by mouth at bedtime. 08/30/16   Adonis Brook, NP  risperiDONE (RISPERDAL) 3 MG tablet Take 1 tablet (3 mg total) by mouth at bedtime. 09/04/16 09/05/16  Lyndal Pulley, MD  Rivaroxaban (XARELTO) 15 MG TABS tablet Take 15 mg by mouth 2 (two) times daily.     Historical Provider, MD  rivaroxaban (XARELTO) 20 MG TABS tablet Take 1 tablet (20 mg total) by mouth daily with supper. 08/30/16   Adonis Brook, NP  traZODone (DESYREL) 50 MG tablet Take 1 tablet (50 mg total) by mouth at bedtime and may repeat dose one time if needed. 08/30/16   Adonis Brook, NP    Family History No family history on file.  Social History Social History  Substance Use Topics  . Smoking status: Current Every Day Smoker    Packs/day: 0.25    Years: 4.00    Types: Cigarettes  . Smokeless tobacco: Never Used  . Alcohol use Yes     Comment: 1 40 oz per day     Allergies   Haldol [haloperidol]  Review of Systems Review of Systems  Constitutional: Negative for chills and fever.  Respiratory: Negative for cough, chest tightness and shortness of breath.   Cardiovascular: Negative for chest pain, palpitations and leg swelling.  Gastrointestinal: Negative for abdominal distention, abdominal pain, diarrhea, nausea and vomiting.  Genitourinary: Negative for dysuria, frequency, hematuria and urgency.  Musculoskeletal: Negative for arthralgias, myalgias, neck pain and neck stiffness.  Skin: Negative for rash.  Allergic/Immunologic: Negative for immunocompromised state.  Neurological: Negative for dizziness, weakness, light-headedness, numbness and  headaches.  Psychiatric/Behavioral: Positive for agitation, hallucinations and sleep disturbance. The patient is nervous/anxious.      Physical Exam Updated Vital Signs BP 121/81 (BP Location: Left Arm)   Pulse 82   Temp 97.8 F (36.6 C) (Oral)   Resp 18   Ht 6' (1.829 m)   Wt 102.5 kg   SpO2 99%   BMI 30.65 kg/m   Physical Exam  Constitutional: He appears well-developed and well-nourished. No distress.  HENT:  Head: Normocephalic and atraumatic.  Eyes: Conjunctivae are normal.  Neck: Neck supple.  Cardiovascular: Normal rate, regular rhythm and normal heart sounds.   Pulmonary/Chest: Effort normal. No respiratory distress. He has no wheezes. He has no rales.  Abdominal: Soft. Bowel sounds are normal. He exhibits no distension. There is no tenderness. There is no rebound.  Musculoskeletal: He exhibits no edema.  Neurological: He is alert.  Skin: Skin is warm and dry.  Psychiatric: His mood appears anxious. He is agitated and actively hallucinating.  Nursing note and vitals reviewed.    ED Treatments / Results  Labs (all labs ordered are listed, but only abnormal results are displayed) Labs Reviewed  ACETAMINOPHEN LEVEL - Abnormal; Notable for the following:       Result Value   Acetaminophen (Tylenol), Serum <10 (*)    All other components within normal limits  CBC - Abnormal; Notable for the following:    Hemoglobin 11.9 (*)    HCT 35.4 (*)    All other components within normal limits  COMPREHENSIVE METABOLIC PANEL  ETHANOL  SALICYLATE LEVEL  RAPID URINE DRUG SCREEN, HOSP PERFORMED    EKG  EKG Interpretation None       Radiology No results found.  Procedures Procedures (including critical care time)  Medications Ordered in ED Medications - No data to display   Initial Impression / Assessment and Plan / ED Course  I have reviewed the triage vital signs and the nursing notes.  Pertinent labs & imaging results that were available during my care  of the patient were reviewed by me and considered in my medical decision making (see chart for details).  Clinical Course   Pt with multiple visits for the same. Here with hallucinations. Pt appears to be aggitated, requesting help. Was seen in ED early this morning and was discharged. He was seen in behavioral health just an hour ago, note from that visit recommended inpatient treatment, patient was sent here. I will get medical clearance labs and TTS consult.  6:30 PM Spoke with behavioral health. Pt already assessed. Recommended inpatient treatment. Pt is calm and cooperative. NAD. Holding orders placed. Pt awaiting bed at [facility].   7:09 PM Discussed with pharmacy regarding pt's xarelto. Patient is reporting that he is taking 15 mg twice a day, for treatment for pulmonary embolism. He has a bottle with him, filled in [place]. Pharmacist is concerned, since this is a wrong treatment. He was filled on 08/05/16, and appropriate treatment for PE  is 15 mg twice a day for 21 days, followed by 20 mg once a day. We will restart him on 20 mg daily tomorrow, he will need a new prescription once he leaves the hospital.  Final Clinical Impressions(s) / ED Diagnoses   Final diagnoses:  Hallucinations    New Prescriptions New Prescriptions   No medications on file     Jaynie Crumbleatyana Malyah Ohlrich, PA-C 09/06/16 2017    Maia PlanJoshua G Long, MD 09/07/16 1335

## 2016-09-07 DIAGNOSIS — F4321 Adjustment disorder with depressed mood: Secondary | ICD-10-CM | POA: Diagnosis not present

## 2016-09-07 DIAGNOSIS — Z79899 Other long term (current) drug therapy: Secondary | ICD-10-CM

## 2016-09-07 DIAGNOSIS — F17210 Nicotine dependence, cigarettes, uncomplicated: Secondary | ICD-10-CM | POA: Diagnosis not present

## 2016-09-07 LAB — RAPID URINE DRUG SCREEN, HOSP PERFORMED
AMPHETAMINES: NOT DETECTED
BARBITURATES: NOT DETECTED
BENZODIAZEPINES: NOT DETECTED
COCAINE: NOT DETECTED
Opiates: NOT DETECTED
TETRAHYDROCANNABINOL: NOT DETECTED

## 2016-09-07 NOTE — [facility] Suicide Risk Assessment (Signed)
Suicide Risk Assessment  Discharge Assessment   [facility]   Principal Problem: Adjustment disorder with depressed mood Discharge Diagnoses:  Patient Active Problem List   Diagnosis Date Noted  . Adjustment disorder with depressed mood [F43.21] 09/07/2016    Priority: High  . Homelessness [Z59.0] 09/03/2016    Priority: High  . Person feigning illness [Z76.5] 09/03/2016    Priority: High  . 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 Cannabis use disorder, mild, abuse [F12.10] 08/29/2016  . Paranoid schizophrenia (HCC) [F20.0] 08/26/2016  . Pulmonary emboli (HCC) [I26.99] 07/13/2016  . Pulmonary embolism and infarction (HCC) [I26.99] 01/02/2014  . Acute left flank pain [R10.9] 01/02/2014  . Pulmonary embolism, bilateral (HCC) [I26.99] 01/02/2014    Total Time spent with patient: 45 minutes  Musculoskeletal: Strength & Muscle Tone: within normal limits Gait & Station: normal Patient leans: N/A  Psychiatric Specialty Exam: Physical Exam  Constitutional: He is oriented to person, place, and time. He appears well-developed and well-nourished.  HENT:  Head: Normocephalic.  Neck: Normal range of motion.  Respiratory: Effort normal.  Musculoskeletal: Normal range of motion.  Neurological: He is alert and oriented to person, place, and time.  Skin: Skin is warm and dry.  Psychiatric: He has a normal mood and affect. His speech is normal and behavior is normal. Judgment and thought content normal. Cognition and memory are normal.    Review of Systems  Constitutional: Negative.   HENT: Negative.   Eyes: Negative.   Respiratory: Negative.   Cardiovascular: Negative.   Gastrointestinal: Negative.   Genitourinary: Negative.   Musculoskeletal: Negative.   Skin: Negative.   Neurological: Negative.   Endo/Heme/Allergies: Negative.   Psychiatric/Behavioral: Positive for depression.    Blood pressure 106/65, pulse 65, temperature 98.2 F (36.8 C),  temperature source Oral, resp. rate 17, height 6' (1.829 m), weight 102.5 kg (226 lb), SpO2 98 %.Body mass index is 30.65 kg/m.  General Appearance: Casual  Eye Contact:  Good  Speech:  Normal Rate  Volume:  Normal  Mood:  Irritable, mild  Affect:  Congruent  Thought Process:  Coherent and Descriptions of Associations: Intact  Orientation:  Full (Time, Place, and Person)  Thought Content:  WDL  Suicidal Thoughts:  No  Homicidal Thoughts:  No  Memory:  Immediate;   Good Recent;   Good Remote;   Good  Judgement:  Fair  Insight:  Fair  Psychomotor Activity:  Normal  Concentration:  Concentration: Good and Attention Span: Good  Recall:  Good  Fund of Knowledge:  Fair  Language:  Good  Akathisia:  No  Handed:  Right  AIMS (if indicated):     Assets:  Leisure Time Physical Health Resilience  ADL's:  Intact  Cognition:  WNL  Sleep:      Mental Status Per Nursing Assessment::   On Admission:   hallucinations   Demographic Factors:  Male  Loss Factors: NA  Historical Factors: NA  Risk Reduction Factors:   Sense of responsibility to family and Positive social support  Continued Clinical Symptoms:  Depression, mild  Cognitive Features That Contribute To Risk:  None    Suicide Risk:  Minimal: No identifiable suicidal ideation.  Patients presenting with no risk factors but with morbid ruminations; may be classified as minimal risk based on the severity of the depressive symptoms    Plan Of Care/Follow-up recommendations:  Activity:  as tolerated Diet:  heart healthy diet  Zhavia Cunanan, NP 09/07/2016, 9:44 AM

## 2016-09-07 NOTE — ED Notes (Signed)
Pt d/c home per MD order. Discharge summary reviewed with pt. Pt verbalizes understanding of discharge summary and resources. Pt denies SI/HI. Pt denies AVH. Pt signed for personal property and property returned. Pt signed e-signature. Ambulatory off unit.

## 2016-09-07 NOTE — Discharge Instructions (Signed)
For your ongoing mental health needs, you are advised to follow up with [facility].  New and returning patients are seen at their walk-in clinic.  Walk-in hours are Monday - Friday from 8:00 am - 3:00 pm.  Walk-in patients are seen on a first come, first served basis.  Try to arrive as early as possible for he best chance of being seen the same day: °  °     [facility] °     [address] °     [place], [place] [address] °     [phone] °  °For supportive services for the homeless, contact the [facility]: °  °     [facility] °     [address] °     [place], [place] [address] °     [phone] °  ° °

## 2016-09-07 NOTE — Consult Note (Signed)
[facility] Consult   Reason for Consult:  Hallucinations  Referring Physician:  EDP Patient Identification: Cole Barrett MRN:  [id number] Principal Diagnosis: Adjustment disorder with depressed mood Diagnosis:   Patient Active Problem List   Diagnosis Date Noted  . Adjustment disorder with depressed mood [F43.21] 09/07/2016    Priority: High  . Homelessness [Z59.0] 09/03/2016    Priority: High  . Person feigning illness [Z76.5] 09/03/2016    Priority: High  . 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 Cannabis use disorder, mild, abuse [F12.10] 08/29/2016  . Paranoid schizophrenia ([place]) [F20.0] 08/26/2016  . Pulmonary emboli ([place]) [I26.99] 07/13/2016  . Pulmonary embolism and infarction ([place]) [I26.99] 01/02/2014  . Acute left flank pain [R10.9] 01/02/2014  . Pulmonary embolism, bilateral ([place]) [I26.99] 01/02/2014    Total Time spent with patient: 45 minutes  Subjective:   Cole Barrett is a 30 y.o. male patient does not warrant admission.  HPI:  30 yo male who presented to the ED with hallucinations.  He was just released from [facility] where he was bullying other patients and demanding services.  Since this time, he has not gone to his outpatient appointment but continues to come to the ED for shelter.  He was discharged from [facility] ED and went to [facility] and was sent here.  Not responding to internal stimuli, no suicidal/homicidal ideations or alcohol/drug abuse.  Stable for discharge.  Encouraged him to go to [place] and given [facility] resources.  Past Psychiatric History: substance abuse with psychosis  Risk to Self: Is patient at risk for suicide?: No Risk to Others:  none Prior Inpatient Therapy:  [facility]  Prior Outpatient Therapy:  [facility]  Past Medical History:  Past Medical History:  Diagnosis Date  . Homelessness   . Hypercholesterolemia   . Mental disorder   . Paranoid behavior ([place])   . PE (pulmonary embolism)   . Schizophrenia ([place])     History reviewed. No pertinent surgical history. Family History: No family history on file. Family Psychiatric  History: none Social History:  History  Alcohol Use  . Yes    Comment: 1 40 oz per day     History  Drug Use  . Types: Marijuana    Comment: 3 to 4 wks ago    Social History   Social History  . Marital status: Single    Spouse name: N/A  . Number of children: N/A  . Years of education: N/A   Social History Main Topics  . Smoking status: Current Every Day Smoker    Packs/day: 0.25    Years: 4.00    Types: Cigarettes  . Smokeless tobacco: Never Used  . Alcohol use Yes     Comment: 1 40 oz per day  . Drug use:     Types: Marijuana     Comment: 3 to 4 wks ago  . Sexual activity: No   Other Topics Concern  . None   Social History Narrative  . None   Additional Social History:    Allergies:   Allergies  Allergen Reactions  . Haldol [Haloperidol] Shortness Of Breath    Labs:  Results for orders placed or performed during the hospital encounter of 09/06/16 (from the past 48 hour(s))  Comprehensive metabolic panel     Status: None   Collection Time: 09/06/16  5:29 PM  Result Value Ref Range   Sodium 138 135 - 145 mmol/L   Potassium 3.8 3.5 - 5.1 mmol/L   Chloride  107 101 - 111 mmol/L   CO2 23 22 - 32 mmol/L   Glucose, Bld 98 65 - 99 mg/dL   BUN 13 6 - 20 mg/dL   Creatinine, Ser 1.06 0.61 - 1.24 mg/dL   Calcium 9.0 8.9 - 10.3 mg/dL   Total Protein 7.9 6.5 - 8.1 g/dL   Albumin 4.0 3.5 - 5.0 g/dL   AST 29 15 - 41 U/L   ALT 41 17 - 63 U/L   Alkaline Phosphatase 48 38 - 126 U/L   Total Bilirubin 0.4 0.3 - 1.2 mg/dL   GFR calc non Af Amer >60 >60 mL/min   GFR calc Af Amer >60 >60 mL/min    Comment: (NOTE) The eGFR has been calculated using the CKD EPI equation. This calculation has not been validated in all clinical situations. eGFR's persistently <60 mL/min signify possible Chronic Kidney Disease.    Anion gap 8 5 - 15  Ethanol     Status:  None   Collection Time: 09/06/16  5:29 PM  Result Value Ref Range   Alcohol, Ethyl (B) <5 <5 mg/dL    Comment:        LOWEST DETECTABLE LIMIT FOR SERUM ALCOHOL IS 5 mg/dL FOR MEDICAL PURPOSES ONLY   Salicylate level     Status: None   Collection Time: 09/06/16  5:29 PM  Result Value Ref Range   Salicylate Lvl <0.3 2.8 - 30.0 mg/dL  Acetaminophen level     Status: Abnormal   Collection Time: 09/06/16  5:29 PM  Result Value Ref Range   Acetaminophen (Tylenol), Serum <10 (L) 10 - 30 ug/mL    Comment:        THERAPEUTIC CONCENTRATIONS VARY SIGNIFICANTLY. A RANGE OF 10-30 ug/mL MAY BE AN EFFECTIVE CONCENTRATION FOR MANY PATIENTS. HOWEVER, SOME ARE BEST TREATED AT CONCENTRATIONS OUTSIDE THIS RANGE. ACETAMINOPHEN CONCENTRATIONS >150 ug/mL AT 4 HOURS AFTER INGESTION AND >50 ug/mL AT 12 HOURS AFTER INGESTION ARE OFTEN ASSOCIATED WITH TOXIC REACTIONS.   cbc     Status: Abnormal   Collection Time: 09/06/16  5:29 PM  Result Value Ref Range   WBC 5.3 4.0 - 10.5 K/uL   RBC 4.35 4.22 - 5.81 MIL/uL   Hemoglobin 11.9 (L) 13.0 - 17.0 g/dL   HCT 35.4 (L) 39.0 - 52.0 %   MCV 81.4 78.0 - 100.0 fL   MCH 27.4 26.0 - 34.0 pg   MCHC 33.6 30.0 - 36.0 g/dL   RDW 13.3 11.5 - 15.5 %   Platelets 331 150 - 400 K/uL  Rapid urine drug screen (hospital performed)     Status: None   Collection Time: 09/07/16 12:31 AM  Result Value Ref Range   Opiates NONE DETECTED NONE DETECTED   Cocaine NONE DETECTED NONE DETECTED   Benzodiazepines NONE DETECTED NONE DETECTED   Amphetamines NONE DETECTED NONE DETECTED   Tetrahydrocannabinol NONE DETECTED NONE DETECTED   Barbiturates NONE DETECTED NONE DETECTED    Comment:        DRUG SCREEN FOR MEDICAL PURPOSES ONLY.  IF CONFIRMATION IS NEEDED FOR ANY PURPOSE, NOTIFY LAB WITHIN 5 DAYS.        LOWEST DETECTABLE LIMITS FOR URINE DRUG SCREEN Drug Class       Cutoff (ng/mL) Amphetamine      1000 Barbiturate      200 Benzodiazepine   704 Tricyclics        888 Opiates          300 Cocaine  300 THC              50     Current Facility-Administered Medications  Medication Dose Route Frequency Provider Last Rate Last Dose  . acetaminophen (TYLENOL) tablet 650 mg  650 mg Oral Q4H PRN Tatyana Kirichenko, PA-C      . alum & mag hydroxide-simeth (MAALOX/MYLANTA) 200-200-20 MG/5ML suspension 30 mL  30 mL Oral PRN Tatyana Kirichenko, PA-C      . benztropine (COGENTIN) tablet 0.5 mg  0.5 mg Oral BID Tatyana Kirichenko, PA-C   0.5 mg at 09/07/16 0905  . cloNIDine HCl (KAPVAY) ER tablet 0.1 mg  0.1 mg Oral QHS Tatyana Kirichenko, PA-C   0.1 mg at 09/06/16 2357  . ibuprofen (ADVIL,MOTRIN) tablet 600 mg  600 mg Oral Q8H PRN Tatyana Kirichenko, PA-C      . nicotine (NICODERM CQ - dosed in mg/24 hours) patch 21 mg  21 mg Transdermal Daily Tatyana Kirichenko, PA-C      . ondansetron (ZOFRAN) tablet 4 mg  4 mg Oral Q8H PRN Tatyana Kirichenko, PA-C      . pravastatin (PRAVACHOL) tablet 20 mg  20 mg Oral QHS Tatyana Kirichenko, PA-C   20 mg at 09/06/16 2356  . risperiDONE (RISPERDAL) tablet 1 mg  1 mg Oral q morning - 10a Tatyana Kirichenko, PA-C   1 mg at 09/07/16 0905  . risperiDONE (RISPERDAL) tablet 3 mg  3 mg Oral QHS Tatyana Kirichenko, PA-C   3 mg at 09/06/16 2356  . rivaroxaban (XARELTO) tablet 20 mg  20 mg Oral Daily Margette Fast, MD   20 mg at 09/07/16 0906  . traZODone (DESYREL) tablet 50-100 mg  50-100 mg Oral QHS PRN Jeannett Senior, PA-C       Current Outpatient Prescriptions  Medication Sig Dispense Refill  . acetaminophen (TYLENOL) 325 MG tablet Take 650 mg by mouth every 4 (four) hours as needed for mild pain, moderate pain or headache.    . benztropine (COGENTIN) 0.5 MG tablet Take 1 tablet (0.5 mg total) by mouth 2 (two) times daily. 60 tablet 0  . cloNIDine HCl (KAPVAY) 0.1 MG TB12 ER tablet Take 1 tablet (0.1 mg total) by mouth at bedtime. 30 tablet 0  . pravastatin (PRAVACHOL) 20 MG tablet Take 20 mg by mouth at bedtime.    .  risperiDONE (RISPERDAL) 1 MG tablet Take 1 tablet (1 mg total) by mouth every morning. 30 tablet 0  . risperiDONE (RISPERDAL) 3 MG tablet Take 1 tablet (3 mg total) by mouth at bedtime. 30 tablet 0  . Rivaroxaban (XARELTO) 15 MG TABS tablet Take 15 mg by mouth 2 (two) times daily.     . traZODone (DESYREL) 50 MG tablet Take 50-100 mg by mouth at bedtime as needed for sleep.    . rivaroxaban (XARELTO) 20 MG TABS tablet Take 1 tablet (20 mg total) by mouth daily with supper. 30 tablet 0    Musculoskeletal: Strength & Muscle Tone: within normal limits Gait & Station: normal Patient leans: N/A  Psychiatric Specialty Exam: Physical Exam  Constitutional: He is oriented to person, place, and time. He appears well-developed and well-nourished.  HENT:  Head: Normocephalic.  Neck: Normal range of motion.  Respiratory: Effort normal.  Musculoskeletal: Normal range of motion.  Neurological: He is alert and oriented to person, place, and time.  Skin: Skin is warm and dry.  Psychiatric: He has a normal mood and affect. His speech is normal and behavior is normal. Judgment and thought  content normal. Cognition and memory are normal.    Review of Systems  Constitutional: Negative.   HENT: Negative.   Eyes: Negative.   Respiratory: Negative.   Cardiovascular: Negative.   Gastrointestinal: Negative.   Genitourinary: Negative.   Musculoskeletal: Negative.   Skin: Negative.   Neurological: Negative.   Endo/Heme/Allergies: Negative.   Psychiatric/Behavioral: Positive for depression.    Blood pressure 106/65, pulse 65, temperature 98.2 F (36.8 C), temperature source Oral, resp. rate 17, height 6' (1.829 m), weight 102.5 kg (226 lb), SpO2 98 %.Body mass index is 30.65 kg/m.  General Appearance: Casual  Eye Contact:  Good  Speech:  Normal Rate  Volume:  Normal  Mood:  Irritable, mild  Affect:  Congruent  Thought Process:  Coherent and Descriptions of Associations: Intact  Orientation:  Full  (Time, Place, and Person)  Thought Content:  WDL  Suicidal Thoughts:  No  Homicidal Thoughts:  No  Memory:  Immediate;   Good Recent;   Good Remote;   Good  Judgement:  Fair  Insight:  Fair  Psychomotor Activity:  Normal  Concentration:  Concentration: Good and Attention Span: Good  Recall:  Good  Fund of Knowledge:  Fair  Language:  Good  Akathisia:  No  Handed:  Right  AIMS (if indicated):     Assets:  Leisure Time Physical Health Resilience  ADL's:  Intact  Cognition:  WNL  Sleep:        Treatment Plan Summary: Daily contact with patient to assess and evaluate symptoms and progress in treatment, Medication management and Plan adjustment disorder with anxiety:  -Crisis stabilization -Medication management:  Medical medications continued, Risperdal changed to Saphris 5 mg BID for psychosis.  Continued cogentin 0.5 mg BID and Trazodone 50 mg at bedtime for sleep. -Individual counseling -[facility] referral along with IRC/homeless resources.  Disposition: No evidence of imminent risk to self or others at present.    Waylan Boga, NP 09/07/2016 9:40 AM  Patient seen face-to-face for psychiatric evaluation, chart reviewed and case discussed with the physician extender and developed treatment plan. Reviewed the information documented and agree with the treatment plan. Corena Pilgrim, MD

## 2016-09-07 NOTE — BH Assessment (Signed)
[facility] Assessment Progress Note  Per Mojeed Akintayo, MD, this pt does not require psychiatric hospitalization at this time.  Pt is to be discharged from [facility] with recommendation to follow up with [facility].  Pt is also to be provided with referral information for [facility], providing supportive services for the homeless.  This has been included in pt's discharge instructions.  Pt's nurse, Ashley, has been notified.  Cole Zaugg, MA Triage Specialist [phone]     

## 2016-09-08 ENCOUNTER — Encounter (HOSPITAL_COMMUNITY): Payer: Self-pay | Admitting: Family Medicine

## 2016-09-08 ENCOUNTER — Emergency Department (HOSPITAL_COMMUNITY)
Admission: EM | Admit: 2016-09-08 | Discharge: 2016-09-08 | Disposition: A | Discharge status: Home / Self Care | Payer: Medicaid Other | Attending: Emergency Medicine | Admitting: Emergency Medicine

## 2016-09-08 ENCOUNTER — Encounter (HOSPITAL_COMMUNITY): Payer: Self-pay | Admitting: Emergency Medicine

## 2016-09-08 DIAGNOSIS — Z7901 Long term (current) use of anticoagulants: Secondary | ICD-10-CM | POA: Diagnosis not present

## 2016-09-08 DIAGNOSIS — F17210 Nicotine dependence, cigarettes, uncomplicated: Secondary | ICD-10-CM | POA: Insufficient documentation

## 2016-09-08 DIAGNOSIS — R443 Hallucinations, unspecified: Secondary | ICD-10-CM

## 2016-09-08 DIAGNOSIS — F1014 Alcohol abuse with alcohol-induced mood disorder: Secondary | ICD-10-CM | POA: Diagnosis not present

## 2016-09-08 DIAGNOSIS — F4321 Adjustment disorder with depressed mood: Secondary | ICD-10-CM | POA: Diagnosis present

## 2016-09-08 DIAGNOSIS — F1094 Alcohol use, unspecified with alcohol-induced mood disorder: Secondary | ICD-10-CM | POA: Diagnosis not present

## 2016-09-08 DIAGNOSIS — R440 Auditory hallucinations: Secondary | ICD-10-CM | POA: Insufficient documentation

## 2016-09-08 DIAGNOSIS — Z79899 Other long term (current) drug therapy: Secondary | ICD-10-CM | POA: Diagnosis not present

## 2016-09-08 DIAGNOSIS — Z765 Malingerer [conscious simulation]: Secondary | ICD-10-CM

## 2016-09-08 DIAGNOSIS — Z5900 Homelessness unspecified: Secondary | ICD-10-CM

## 2016-09-08 LAB — COMPREHENSIVE METABOLIC PANEL
ALBUMIN: 3.9 g/dL (ref 3.5–5.0)
ALK PHOS: 49 U/L (ref 38–126)
ALT: 37 U/L (ref 17–63)
ALT: 33 U/L (ref 17–63)
ANION GAP: 9 (ref 5–15)
AST: 24 U/L (ref 15–41)
AST: 21 U/L (ref 15–41)
Albumin: 3.7 g/dL (ref 3.5–5.0)
Alkaline Phosphatase: 46 U/L (ref 38–126)
Anion gap: 9 (ref 5–15)
BILIRUBIN TOTAL: 0.3 mg/dL (ref 0.3–1.2)
BUN: 11 mg/dL (ref 6–20)
BUN: 12 mg/dL (ref 6–20)
CALCIUM: 8.7 mg/dL — AB (ref 8.9–10.3)
CHLORIDE: 103 mmol/L (ref 101–111)
CO2: 23 mmol/L (ref 22–32)
CO2: 24 mmol/L (ref 22–32)
CREATININE: 1 mg/dL (ref 0.61–1.24)
CREATININE: 1.19 mg/dL (ref 0.61–1.24)
Calcium: 8.8 mg/dL — ABNORMAL LOW (ref 8.9–10.3)
Chloride: 106 mmol/L (ref 101–111)
GFR calc Af Amer: >60 mL/min (ref >60)
GFR calc non Af Amer: >60 mL/min (ref >60)
GFR calc non Af Amer: >60 mL/min (ref >60)
GLUCOSE: 109 mg/dL — AB (ref 65–99)
Glucose, Bld: 82 mg/dL (ref 65–99)
Potassium: 3.4 mmol/L — ABNORMAL LOW (ref 3.5–5.1)
Potassium: 3.6 mmol/L (ref 3.5–5.1)
SODIUM: 136 mmol/L (ref 135–145)
Sodium: 138 mmol/L (ref 135–145)
TOTAL PROTEIN: 8 g/dL (ref 6.5–8.1)
Total Bilirubin: 0.3 mg/dL (ref 0.3–1.2)
Total Protein: 7.3 g/dL (ref 6.5–8.1)

## 2016-09-08 LAB — CBC
HCT: 34.4 % — ABNORMAL LOW (ref 39.0–52.0)
HEMATOCRIT: 34.5 % — AB (ref 39.0–52.0)
HEMOGLOBIN: 11.2 g/dL — AB (ref 13.0–17.0)
HEMOGLOBIN: 11.2 g/dL — AB (ref 13.0–17.0)
MCH: 27.2 pg (ref 26.0–34.0)
MCH: 27.2 pg (ref 26.0–34.0)
MCHC: 32.5 g/dL (ref 30.0–36.0)
MCHC: 32.6 g/dL (ref 30.0–36.0)
MCV: 83.7 fL (ref 78.0–100.0)
MCV: 83.5 fL (ref 78.0–100.0)
Platelets: 311 10*3/uL (ref 150–400)
Platelets: 336 10*3/uL (ref 150–400)
RBC: 4.12 MIL/uL — ABNORMAL LOW (ref 4.22–5.81)
RBC: 4.12 MIL/uL — AB (ref 4.22–5.81)
RDW: 13.6 % (ref 11.5–15.5)
RDW: 13.4 % (ref 11.5–15.5)
WBC: 6.4 10*3/uL (ref 4.0–10.5)
WBC: 6.8 10*3/uL (ref 4.0–10.5)

## 2016-09-08 LAB — SALICYLATE LEVEL: Salicylate Lvl: <4 mg/dL (ref 2.8–30.0)

## 2016-09-08 LAB — ACETAMINOPHEN LEVEL: Acetaminophen (Tylenol), Serum: <10 ug/mL — ABNORMAL LOW (ref 10–30)

## 2016-09-08 LAB — ETHANOL
Alcohol, Ethyl (B): 83 mg/dL — ABNORMAL HIGH (ref <5)
Alcohol, Ethyl (B): <5 mg/dL (ref <5)

## 2016-09-08 NOTE — ED Notes (Addendum)
Pt d/c home per MD order. Discharge summary reviewed  and out patient resources.Pt irritable, not interested. Pt denies SI/HI. Pt refused to sign for personal property, property returned to pt. Pt ambulatory off unit with [facility].

## 2016-09-08 NOTE — Consult Note (Signed)
[facility] Consult   Reason for Consult:  Hallucinations  Referring Physician:  EDP Patient Identification: Cole Barrett MRN:  [id number] Principal Diagnosis: Alcohol induced mood disorder Diagnosis:   Patient Active Problem List   Diagnosis Date Noted  . Adjustment disorder with depressed mood [F43.21] 09/07/2016    Priority: High  . Homelessness [Z59.0] 09/03/2016    Priority: High  . Person feigning illness [Z76.5] 09/03/2016    Priority: High  . 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 Cannabis use disorder, mild, abuse [F12.10] 08/29/2016  . Paranoid schizophrenia ([place]) [F20.0] 08/26/2016  . Pulmonary emboli ([place]) [I26.99] 07/13/2016  . Pulmonary embolism and infarction ([place]) [I26.99] 01/02/2014  . Acute left flank pain [R10.9] 01/02/2014  . Pulmonary embolism, bilateral ([place]) [I26.99] 01/02/2014    Total Time spent with patient: 45 minutes  Subjective:   Cole Barrett is a 30 y.o. male patient does not warrant admission.  HPI:  30 yo male who presented to the ED with hallucinations.  He was just released from [facility] where he was bullying other patients and demanding services.  Since this time, he has not gone to his outpatient appointment but continues to come to the ED for shelter.  He was discharged from [facility] ED and went to [facility] and was sent here.  Not responding to internal stimuli, no suicidal/homicidal ideations or alcohol/drug abuse.  Stable for discharge.  Encouraged him to go to [place] and given [facility] resources.  This morning the patient returned after drinking alcohol and stated the voices were telling him to kill himself, denied suicidal/homicidal ideations and withdrawal symptoms.  Today, he is clear and coherent and encouraged to use his resources to go to [facility] if his medications are not working.  Past Psychiatric History: substance abuse with psychosis  Risk to Self: Suicidal Ideation: No Suicidal Intent: No Is  patient at risk for suicide?: No Suicidal Plan?: No Access to Means: No What has been your use of drugs/alcohol within the last 12 months?: ETOH How many times?: 0 Other Self Harm Risks: None Triggers for Past Attempts: None known Intentional Self Injurious Behavior: None Risk to Others: Homicidal Ideation: No Thoughts of Harm to Others: No Current Homicidal Intent: No Current Homicidal Plan: No Access to Homicidal Means: No Identified Victim: No one History of harm to others?: No Assessment of Violence: None Noted Violent Behavior Description: None reported Does patient have access to weapons?: No Criminal Charges Pending?: Yes Describe Pending Criminal Charges: Public urination Does patient have a court date: Yes Court Date: 10/27/17none Prior Inpatient Therapy: Prior Inpatient Therapy: Yes Prior Therapy Dates: 2017 Prior Therapy Facilty/Provider(s): [facility] facility in [place] Reason for Treatment: [facility]  Prior Outpatient Therapy: Prior Outpatient Therapy: No Prior Therapy Dates: na Prior Therapy Facilty/Provider(s): na Reason for Treatment: na Does patient have an ACCT team?: No Does patient have Intensive In-House Services?  : No Does patient have [facility] services? : No Does [facility]  Past Medical History:  Past Medical History:  Diagnosis Date  . Homelessness   . Hypercholesterolemia   . Mental disorder   . Paranoid behavior ([place])   . PE (pulmonary embolism)   . Schizophrenia ([place])    History reviewed. No pertinent surgical history. Family History: History reviewed. No pertinent family history. Family Psychiatric  History: none Social History:  History  Alcohol Use  . Yes    Comment: Daily. Smells ETOH.      History  Drug  Use  . Types: Marijuana    Social History   Social History  . Marital status: Single    Spouse name: N/A  . Number of children: N/A  . Years of education: N/A   Social History Main  Topics  . Smoking status: Current Every Day Smoker    Packs/day: 0.25    Years: 4.00    Types: Cigarettes  . Smokeless tobacco: Never Used  . Alcohol use Yes     Comment: Daily. Smells ETOH.   . Drug use:     Types: Marijuana  . Sexual activity: No   Other Topics Concern  . None   Social History Narrative  . None   Additional Social History:    Allergies:   Allergies  Allergen Reactions  . Haldol [Haloperidol] Shortness Of Breath    Labs:  Results for orders placed or performed during the hospital encounter of 09/08/16 (from the past 48 hour(s))  Comprehensive metabolic panel     Status: Abnormal   Collection Time: 09/08/16  1:28 AM  Result Value Ref Range   Sodium 138 135 - 145 mmol/L   Potassium 3.4 (L) 3.5 - 5.1 mmol/L   Chloride 106 101 - 111 mmol/L   CO2 23 22 - 32 mmol/L   Glucose, Bld 109 (H) 65 - 99 mg/dL   BUN 11 6 - 20 mg/dL   Creatinine, Ser 1.00 0.61 - 1.24 mg/dL   Calcium 8.7 (L) 8.9 - 10.3 mg/dL   Total Protein 8.0 6.5 - 8.1 g/dL   Albumin 3.9 3.5 - 5.0 g/dL   AST 24 15 - 41 U/L   ALT 37 17 - 63 U/L   Alkaline Phosphatase 49 38 - 126 U/L   Total Bilirubin 0.3 0.3 - 1.2 mg/dL   GFR calc non Af Amer >60 >60 mL/min   GFR calc Af Amer >60 >60 mL/min    Comment: (NOTE) The eGFR has been calculated using the CKD EPI equation. This calculation has not been validated in all clinical situations. eGFR's persistently <60 mL/min signify possible Chronic Kidney Disease.    Anion gap 9 5 - 15  Ethanol     Status: Abnormal   Collection Time: 09/08/16  1:28 AM  Result Value Ref Range   Alcohol, Ethyl (B) 83 (H) <5 mg/dL    Comment:        LOWEST DETECTABLE LIMIT FOR SERUM ALCOHOL IS 5 mg/dL FOR MEDICAL PURPOSES ONLY   Salicylate level     Status: None   Collection Time: 09/08/16  1:28 AM  Result Value Ref Range   Salicylate Lvl <8.8 2.8 - 30.0 mg/dL  Acetaminophen level     Status: Abnormal   Collection Time: 09/08/16  1:28 AM  Result Value Ref  Range   Acetaminophen (Tylenol), Serum <10 (L) 10 - 30 ug/mL    Comment:        THERAPEUTIC CONCENTRATIONS VARY SIGNIFICANTLY. A RANGE OF 10-30 ug/mL MAY BE AN EFFECTIVE CONCENTRATION FOR MANY PATIENTS. HOWEVER, SOME ARE BEST TREATED AT CONCENTRATIONS OUTSIDE THIS RANGE. ACETAMINOPHEN CONCENTRATIONS >150 ug/mL AT 4 HOURS AFTER INGESTION AND >50 ug/mL AT 12 HOURS AFTER INGESTION ARE OFTEN ASSOCIATED WITH TOXIC REACTIONS.   cbc     Status: Abnormal   Collection Time: 09/08/16  1:28 AM  Result Value Ref Range   WBC 6.4 4.0 - 10.5 K/uL   RBC 4.12 (L) 4.22 - 5.81 MIL/uL   Hemoglobin 11.2 (L) 13.0 - 17.0 g/dL   HCT 34.5 (  L) 39.0 - 52.0 %   MCV 83.7 78.0 - 100.0 fL   MCH 27.2 26.0 - 34.0 pg   MCHC 32.5 30.0 - 36.0 g/dL   RDW 13.6 11.5 - 15.5 %   Platelets 311 150 - 400 K/uL    No current facility-administered medications for this encounter.    Current Outpatient Prescriptions  Medication Sig Dispense Refill  . acetaminophen (TYLENOL) 325 MG tablet Take 650 mg by mouth every 4 (four) hours as needed for mild pain, moderate pain or headache.    . benztropine (COGENTIN) 0.5 MG tablet Take 1 tablet (0.5 mg total) by mouth 2 (two) times daily. 60 tablet 0  . cloNIDine HCl (KAPVAY) 0.1 MG TB12 ER tablet Take 1 tablet (0.1 mg total) by mouth at bedtime. 30 tablet 0  . pravastatin (PRAVACHOL) 20 MG tablet Take 20 mg by mouth at bedtime.    . risperiDONE (RISPERDAL) 1 MG tablet Take 1 tablet (1 mg total) by mouth every morning. 30 tablet 0  . risperiDONE (RISPERDAL) 3 MG tablet Take 1 tablet (3 mg total) by mouth at bedtime. 30 tablet 0  . Rivaroxaban (XARELTO) 15 MG TABS tablet Take 15 mg by mouth 2 (two) times daily.     . traZODone (DESYREL) 50 MG tablet Take 50-100 mg by mouth at bedtime as needed for sleep.    . rivaroxaban (XARELTO) 20 MG TABS tablet Take 1 tablet (20 mg total) by mouth daily with supper. 30 tablet 0    Musculoskeletal: Strength & Muscle Tone: within normal  limits Gait & Station: normal Patient leans: N/A  Psychiatric Specialty Exam: Physical Exam  Constitutional: He is oriented to person, place, and time. He appears well-developed and well-nourished.  HENT:  Head: Normocephalic.  Neck: Normal range of motion.  Respiratory: Effort normal.  Musculoskeletal: Normal range of motion.  Neurological: He is alert and oriented to person, place, and time.  Skin: Skin is warm and dry.  Psychiatric: He has a normal mood and affect. His speech is normal and behavior is normal. Judgment and thought content normal. Cognition and memory are normal.    Review of Systems  Constitutional: Negative.   HENT: Negative.   Eyes: Negative.   Respiratory: Negative.   Cardiovascular: Negative.   Gastrointestinal: Negative.   Genitourinary: Negative.   Musculoskeletal: Negative.   Skin: Negative.   Neurological: Negative.   Endo/Heme/Allergies: Negative.   Psychiatric/Behavioral: Positive for substance abuse.    Blood pressure 137/70, pulse 96, temperature 98.3 F (36.8 C), temperature source Oral, resp. rate 18, SpO2 96 %.There is no height or weight on file to calculate BMI.  General Appearance: Casual  Eye Contact:  Good  Speech:  Normal Rate  Volume:  Normal  Mood:  Irritable, mild  Affect:  Congruent  Thought Process:  Coherent and Descriptions of Associations: Intact  Orientation:  Full (Time, Place, and Person)  Thought Content:  WDL  Suicidal Thoughts:  No  Homicidal Thoughts:  No  Memory:  Immediate;   Good Recent;   Good Remote;   Good  Judgement:  Fair  Insight:  Fair  Psychomotor Activity:  Normal  Concentration:  Concentration: Good and Attention Span: Good  Recall:  Good  Fund of Knowledge:  Fair  Language:  Good  Akathisia:  No  Handed:  Right  AIMS (if indicated):     Assets:  Leisure Time Physical Health Resilience  ADL's:  Intact  Cognition:  WNL  Sleep:  Treatment Plan Summary: Daily contact with patient  to assess and evaluate symptoms and progress in treatment, Medication management and Plan adjustment disorder with anxiety:  -Crisis stabilization -Medication management:  Medical medications continued, Risperdal changed to Saphris 5 mg BID for psychosis.  Continued cogentin 0.5 mg BID and Trazodone 50 mg at bedtime for sleep. -Individual counseling -[facility] referral along with IRC/homeless resources.  Disposition: No evidence of imminent risk to self or others at present.    Waylan Boga, NP 09/08/2016 8:42 AM  Patient seen face-to-face for psychiatric evaluation, chart reviewed and case discussed with the physician extender and developed treatment plan. Reviewed the information documented and agree with the treatment plan. Corena Pilgrim, MD

## 2016-09-08 NOTE — ED Notes (Signed)
Pt ambulatory to restroom

## 2016-09-08 NOTE — BH Assessment (Signed)
Tele Assessment Note   Cole Barrett is an 30 y.o. male.  -Clinician reviewed note by Dr. Wilkie Aye.  Cole Barrett is a 30 y.o. male who presents to the Emergency Department complaining of auditory hallucinations. He states the voices he is hearing are telling him "that he will kill him". He states he became so overwhelmed with the voices that he lost consciousness. Pt has experienced auditory hallucinations before and states he is compliant with his prescribed medications. Pt states he does drink alcohol and drank earlier today. He denies illegal drug use. Pt denies any acute injuries. Denies SI.  Patient was discharged from [facility] on 10/03.  He has had six TTS assessments since 08/21/16.  Patient was admitted to [facility] on 10/10 after being a walk-in patient.  He had been d/c'ed from [name] at 07:00 on 10/10 and came to [facility] as a walk in.  He was later discharged from [facility] on 10/11 around 11:00.  At that time he was encouraged to go to [facility] for outpatient resources since Dr. Jannifer Franklin said he did not meet inpatient care criteria.  Patient did not follow up with [facility].  Tonight, patient said that someone called EMS for him to pick him up.  Patient was inebriated at a [employer] station.  Pt BAL was 83 at 01:28.  Patient was asleep when clinician talked to him.  Patient had to be told several times to participate as he was falling asleep.  Patient says that he does not want to kill himself.  He does not want to harm other people.  Patient does not see things.  He does however hear a voice tell him that they are going to kill him.  Dr. Wilkie Aye said that patient appeared agitated when he told her about this.  Patient barely opened his eyes when describing this to this clinician.  Patient says he does not have any medications.  Patient says that he was inpatient at a facility in [place].  He has been referred to [facility] but does not appear to have been successful in making contact with  them.  -Clinician discussed patient care with Donell Sievert, PA who recommends AM psych eval.  Also discussed this disposition with Dr. Wilkie Aye and she is in agreement.  Diagnosis: Schizophrenia  Past Medical History:  Past Medical History:  Diagnosis Date  . Homelessness   . Hypercholesterolemia   . Mental disorder   . Paranoid behavior (HCC)   . PE (pulmonary embolism)   . Schizophrenia (HCC)     History reviewed. No pertinent surgical history.  Family History: History reviewed. No pertinent family history.  Social History:  reports that he has been smoking Cigarettes.  He has a 1.00 pack-year smoking history. He has never used smokeless tobacco. He reports that he drinks alcohol. He reports that he uses drugs, including Marijuana.  Additional Social History:  Alcohol / Drug Use Pain Medications: None Prescriptions: Pt not taking medications. Over the Counter: None History of alcohol / drug use?: Yes Substance #1 Name of Substance 1: ETOH 1 - Age of First Use: Teens 1 - Amount (size/oz): Two 40 oz beers 1 - Frequency: every other day 1 - Duration: On-going 1 - Last Use / Amount: 10/12 Substance #2 Name of Substance 2: Marijuana 2 - Age of First Use: Unknown 2 - Amount (size/oz): Unknown 2 - Frequency: Every 2-3 months 2 - Duration: Pt says he does not use THC 2 - Last Use / Amount: Unknown  CIWA:  CIWA-Ar BP: 137/70 Pulse Rate: 96 COWS:    PATIENT STRENGTHS: (choose at least two) Capable of independent living Communication skills  Allergies:  Allergies  Allergen Reactions  . Haldol [Haloperidol] Shortness Of Breath    Home Medications:  (Not in a hospital admission)  OB/GYN Status:  No LMP for male patient.  General Assessment Data [facility] of Assessment: [facility] TTS Assessment: In system Is this a Tele or Face-to-Face Assessment?: Face-to-Face Is this an Initial Assessment or a Re-assessment for this encounter?: Initial Assessment Marital status:  Single Is patient pregnant?: No Pregnancy Status: No Living Arrangements: Other (Comment) (Pt is probably homeless.) Can pt return to current living arrangement?: Yes Admission Status: Voluntary Is patient capable of signing voluntary admission?: Yes Referral Source: Self/Family/Friend Insurance type: [place] [employer]     Crisis Care Plan Living Arrangements: Other (Comment) (Pt is probably homeless.) Name of Psychiatrist: None Name of Therapist: None  Education Status Is patient currently in school?: No Highest grade of school patient has completed: 12th grade  Risk to self with the past 6 months Suicidal Ideation: No Has patient been a risk to self within the past 6 months prior to admission? : No Suicidal Intent: No Has patient had any suicidal intent within the past 6 months prior to admission? : No Is patient at risk for suicide?: No Suicidal Plan?: No Has patient had any suicidal plan within the past 6 months prior to admission? : No Access to Means: No What has been your use of drugs/alcohol within the last 12 months?: ETOH Previous Attempts/Gestures: No How many times?: 0 Other Self Harm Risks: None Triggers for Past Attempts: None known Intentional Self Injurious Behavior: None Family Suicide History: Unknown Recent stressful life event(s): Other (Comment) (Nothing identified other than possible homelessness) Persecutory voices/beliefs?: No Depression: Yes Depression Symptoms: Despondent, Feeling worthless/self pity Substance abuse history and/or treatment for substance abuse?: No Suicide prevention information given to non-admitted patients: Not applicable  Risk to Others within the past 6 months Homicidal Ideation: No Does patient have any lifetime risk of violence toward others beyond the six months prior to admission? : No Thoughts of Harm to Others: No Current Homicidal Intent: No Current Homicidal Plan: No Access to Homicidal Means: No Identified Victim:  No one History of harm to others?: No Assessment of Violence: None Noted Violent Behavior Description: None reported Does patient have access to weapons?: No Criminal Charges Pending?: Yes Describe Pending Criminal Charges: Public urination Does patient have a court date: Yes Court Date: 09/23/16 Is patient on probation?: No  Psychosis Hallucinations: Auditory (Voices saying they are going to kill him.) Delusions: None noted  Mental Status Report Appearance/Hygiene: Disheveled, Poor hygiene, In scrubs Eye Contact: Poor Motor Activity: Freedom of movement Speech: Soft, Logical/coherent Level of Consciousness: Drowsy, Sleeping Mood: Despair, Sad Affect: Blunted, Sad Anxiety Level: Minimal Thought Processes: Coherent, Relevant Judgement: Impaired Orientation: Appropriate for developmental age Obsessive Compulsive Thoughts/Behaviors: None  Cognitive Functioning Concentration: Decreased Memory: Recent Impaired, Remote Impaired IQ: Average Insight: Poor Impulse Control: Poor Appetite: Fair Weight Loss: 0 Weight Gain: 0 Sleep: No Change Total Hours of Sleep:  (<6H/D) Vegetative Symptoms: None  ADLScreening [facility] Assessment Services) Patient's cognitive ability adequate to safely complete daily activities?: Yes Patient able to express need for assistance with ADLs?: Yes Independently performs ADLs?: Yes (appropriate for developmental age)  Prior Inpatient Therapy Prior Inpatient Therapy: Yes Prior Therapy Dates: 2017 Prior Therapy Facilty/Provider(s[facility] [place] Reason for Treatment: psychosis  Prior Outpatient Therapy Prior Outpatient Therapy: No Prior Therapy Dates: na Prior Therapy Facilty/Provider(s): na Reason for Treatment: na Does patient have an ACCT team?: No Does patient have Intensive In-House Services?  : No Does patient have [facility] services? : No Does patient have P4CC services?: No  ADL Screening (condition at time of  admission) Patient's cognitive ability adequate to safely complete daily activities?: Yes Is the patient deaf or have difficulty hearing?: No Does the patient have difficulty seeing, even when wearing glasses/contacts?: No Does the patient have difficulty concentrating, remembering, or making decisions?: Yes Patient able to express need for assistance with ADLs?: Yes Does the patient have difficulty dressing or bathing?: No Independently performs ADLs?: Yes (appropriate for developmental age) Does the patient have difficulty walking or climbing stairs?: No Weakness of Legs: None Weakness of Arms/Hands: None       Abuse/Neglect Assessment (Assessment to be complete while patient is alone) Physical Abuse: Denies Verbal Abuse: Denies Sexual Abuse: Denies Exploitation of patient/patient's resources: Denies Self-Neglect: Denies     Merchant navy officer (For Healthcare) Does patient have an advance directive?: No Would patient like information on creating an advanced directive?: No - patient declined information    Additional Information 1:1 In Past 12 Months?: No CIRT Risk: No Elopement Risk: No Does patient have medical clearance?: Yes     Disposition:  Disposition Initial Assessment Completed for this Encounter: Yes Disposition of Patient: Other dispositions Type of inpatient treatment program: Adult Other disposition(s): Other (Comment) (Pt to receive AM psych eval)  Beatriz Stallion Ray 09/08/2016 5:07 AM

## 2016-09-08 NOTE — ED Provider Notes (Signed)
[facility]-EMERGENCY DEPT Provider Note   CSN: [id number] Arrival date & time: 09/08/16  0116  By signing my name below, I, Alyssa Grove, attest that this documentation has been prepared under the direction and in the presence of Shon Baton, MD. Electronically Signed: Alyssa Grove, ED Scribe. 09/08/16. 2:45 AM.   History   Chief Complaint Chief Complaint  Patient presents with  . Psychiatric Evaluation   The history is provided by the patient. No language interpreter was used.   HPI Comments: Cole Barrett is a 30 y.o. male who presents to the Emergency Department complaining of auditory hallucinations. He states the voices he is hearing are telling him "that he will kill him". He states he became so overwhelmed with the voices that he lost consciousness. Pt has experienced auditory hallucinations before and states he is compliant with his prescribed medications. Pt states he does drink alcohol and drank earlier today. He denies illegal drug use. Pt denies any acute injuries. Denies SI.  Patient seen and evaluated yesterday for the same.  Past Medical History:  Diagnosis Date  . Homelessness   . Hypercholesterolemia   . Mental disorder   . Paranoid behavior (HCC)   . PE (pulmonary embolism)   . Schizophrenia [facility])     Patient Active Problem List   Diagnosis Date Noted  . Adjustment disorder with depressed mood 09/07/2016  . Homelessness 09/03/2016  . Person feigning illness 09/03/2016  . 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 Cannabis use disorder, mild, abuse 08/29/2016  . Paranoid schizophrenia (HCC) 08/26/2016  . Pulmonary emboli (HCC) 07/13/2016  . 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 Acute left flank pain 01/02/2014  . Pulmonary embolism, bilateral (HCC) 01/02/2014    History reviewed. No pertinent surgical history.     Home Medications    Prior to Admission medications   Medication Sig Start Date End Date Taking? Authorizing  Provider  acetaminophen (TYLENOL) 325 MG tablet Take 650 mg by mouth every 4 (four) hours as needed for mild pain, moderate pain or headache.   Yes Historical Provider, MD  benztropine (COGENTIN) 0.5 MG tablet Take 1 tablet (0.5 mg total) by mouth 2 (two) times daily. 08/30/16  Yes Adonis Brook, NP  cloNIDine HCl (KAPVAY) 0.1 MG TB12 ER tablet Take 1 tablet (0.1 mg total) by mouth at bedtime. 08/30/16  Yes Adonis Brook, NP  pravastatin (PRAVACHOL) 20 MG tablet Take 20 mg by mouth at bedtime.   Yes Historical Provider, MD  risperiDONE (RISPERDAL) 1 MG tablet Take 1 tablet (1 mg total) by mouth every morning. 08/31/16  Yes Adonis Brook, NP  risperiDONE (RISPERDAL) 3 MG tablet Take 1 tablet (3 mg total) by mouth at bedtime. 08/30/16  Yes Adonis Brook, NP  Rivaroxaban (XARELTO) 15 MG TABS tablet Take 15 mg by mouth 2 (two) times daily.    Yes Historical Provider, MD  traZODone (DESYREL) 50 MG tablet Take 50-100 mg by mouth at bedtime as needed for sleep.   Yes Historical Provider, MD  rivaroxaban (XARELTO) 20 MG TABS tablet Take 1 tablet (20 mg total) by mouth daily with supper. 08/30/16   Adonis Brook, NP    Family History History reviewed. No pertinent family history.  Social History Social History  Substance Use Topics  . Smoking status: Current Every Day Smoker    Packs/day: 0.25    Years: 4.00    Types: Cigarettes  . Smokeless tobacco: Never Used  . Alcohol use Yes     Comment: Daily.  Smells ETOH.      Allergies   Haldol [haloperidol]   Review of Systems Review of Systems  Constitutional: Negative for fever.  Respiratory: Negative for shortness of breath.   Cardiovascular: Negative for chest pain.  Gastrointestinal: Negative for abdominal pain, nausea and vomiting.  Psychiatric/Behavioral: Positive for hallucinations. Negative for suicidal ideas.  All other systems reviewed and are negative.  Physical Exam Updated Vital Signs BP 137/70   Pulse 96   Temp 98.3 F  (36.8 C) (Oral)   Resp 18   SpO2 96%   Physical Exam  Constitutional: He is oriented to person, place, and time.  Agitated  HENT:  Head: Normocephalic and atraumatic.  Cardiovascular: Normal rate, regular rhythm and normal heart sounds.   No murmur heard. Pulmonary/Chest: Effort normal and breath sounds normal. No respiratory distress. He has no wheezes.  Abdominal: Soft. Bowel sounds are normal. There is no tenderness. There is no rebound.  Musculoskeletal: He exhibits no edema.  Neurological: He is alert and oriented to person, place, and time.  Skin: Skin is warm and dry.  Psychiatric:  Agitated, aggressive  Nursing note and vitals reviewed.    ED Treatments / Results  DIAGNOSTIC STUDIES: Oxygen Saturation is 96% on RA, adequate by my interpretation.    COORDINATION OF CARE: 2:42 AM Discussed treatment plan with pt at bedside which includes Consult to TTS and pt agreed to plan.  Labs (all labs ordered are listed, but only abnormal results are displayed) Labs Reviewed  COMPREHENSIVE METABOLIC PANEL - Abnormal; Notable for the following:       Result Value   Potassium 3.4 (*)    Glucose, Bld 109 (*)    Calcium 8.7 (*)    All other components within normal limits  ETHANOL - Abnormal; Notable for the following:    Alcohol, Ethyl (B) 83 (*)    All other components within normal limits  ACETAMINOPHEN LEVEL - Abnormal; Notable for the following:    Acetaminophen (Tylenol), Serum <10 (*)    All other components within normal limits  CBC - Abnormal; Notable for the following:    RBC 4.12 (*)    Hemoglobin 11.2 (*)    HCT 34.5 (*)    All other components within normal limits  SALICYLATE LEVEL    EKG  EKG Interpretation None       Radiology No results found.  Procedures Procedures (including critical care time)  Medications Ordered in ED Medications - No data to display   Initial Impression / Assessment and Plan / ED Course  I have reviewed the triage  vital signs and the nursing notes.  Pertinent labs & imaging results that were available during my care of the patient were reviewed by me and considered in my medical decision making (see chart for details).  Clinical Course   Patient presents reporting hallucinations. Reports compliance with medications but question this. He is agitated but cooperative.  We'll have TTS reevaluate. Lab work largely reassuring.  Medically cleared.  Final Clinical Impressions(s) / ED Diagnoses   Final diagnoses:  Hallucinations    New Prescriptions New Prescriptions   No medications on file     Shon Batonourtney F Ivar Domangue, MD 09/08/16 [phone]

## 2016-09-08 NOTE — ED Notes (Signed)
Bed: [facility]3 Expected date:  Expected time:  Means of arrival:  Comments: 

## 2016-09-08 NOTE — Progress Notes (Signed)
Pt. Presents to the ED after discharging earlier in the day.  Pt. Reports [employer] was called because of pt.'s bizarre behavior at a gas station.  Pt. Had been drinking ETOH with a level of 83.  Pt. States that he needs help because he cannot get the voices out of his head telling him that they are going to kill him.  Pt. Denies that he is SI or HI.  Pt is homeless.  Pt. Contracts for safety. No pain or discomfort noted at present time.

## 2016-09-08 NOTE — B[facility] Suicide Risk Assessment (Signed)
Suicide Risk Assessment  Discharge Assessment   [facility]   Principal Problem: Alcohol-induced mood disorder [facility](HCC) Discharge Diagnoses:  Patient Active Problem List   Diagnosis Date Noted  . 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 Adjustment disorder with depressed mood [F43.21] 09/07/2016    Priority: High  . Homelessness [Z59.0] 09/03/2016    Priority: High  . Person feigning illness [Z76.5] 09/03/2016    Priority: High  . 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 Cannabis use disorder, mild, abuse [F12.10] 08/29/2016  . Paranoid schizophrenia (HCC) [F20.0] 08/26/2016  . Pulmonary emboli (HCC) [I26.99] 07/13/2016  . Pulmonary embolism and infarction (HCC) [I26.99] 01/02/2014  . Acute left flank pain [R10.9] 01/02/2014  . Pulmonary embolism, bilateral (HCC) [I26.99] 01/02/2014    Total Time spent with patient: 45 minutes   Musculoskeletal: Strength & Muscle Tone: within normal limits Gait & Station: normal Patient leans: N/A  Psychiatric Specialty Exam: Physical Exam  Constitutional: He is oriented to person, place, and time. He appears well-developed and well-nourished.  HENT:  Head: Normocephalic.  Neck: Normal range of motion.  Respiratory: Effort normal.  Musculoskeletal: Normal range of motion.  Neurological: He is alert and oriented to person, place, and time.  Skin: Skin is warm and dry.  Psychiatric: He has a normal mood and affect. His speech is normal and behavior is normal. Judgment and thought content normal. Cognition and memory are normal.    Review of Systems  Constitutional: Negative.   HENT: Negative.   Eyes: Negative.   Respiratory: Negative.   Cardiovascular: Negative.   Gastrointestinal: Negative.   Genitourinary: Negative.   Musculoskeletal: Negative.   Skin: Negative.   Neurological: Negative.   Endo/Heme/Allergies: Negative.   Psychiatric/Behavioral: Positive for  substance abuse.    Blood pressure 137/70, pulse 96, temperature 98.3 F (36.8 C), temperature source Oral, resp. rate 18, SpO2 96 %.There is no height or weight on file to calculate BMI.  General Appearance: Casual  Eye Contact:  Good  Speech:  Normal Rate  Volume:  Normal  Mood:  Irritable, mild  Affect:  Congruent  Thought Process:  Coherent and Descriptions of Associations: Intact  Orientation:  Full (Time, Place, and Person)  Thought Content:  WDL  Suicidal Thoughts:  No  Homicidal Thoughts:  No  Memory:  Immediate;   Good Recent;   Good Remote;   Good  Judgement:  Fair  Insight:  Fair  Psychomotor Activity:  Normal  Concentration:  Concentration: Good and Attention Span: Good  Recall:  Good  Fund of Knowledge:  Fair  Language:  Good  Akathisia:  No  Handed:  Right  AIMS (if indicated):     Assets:  Leisure Time Physical Health Resilience  ADL's:  Intact  Cognition:  WNL  Sleep:      Mental Status Per Nursing Assessment::   On Admission:   alcohol abuse with hallucinations  Demographic Factors:  Male  Loss Factors: NA  Historical Factors: NA  Risk Reduction Factors:   Sense of responsibility to family  Continued Clinical Symptoms:  None   Cognitive Features That Contribute To Risk:  None    Suicide Risk:  Minimal: No identifiable suicidal ideation.  Patients presenting with no risk factors but with morbid ruminations; may be classified as minimal risk based on the severity of the depressive symptoms    Plan Of Care/Follow-up recommendations:  Activity:  as tolerated Diet:  heart healthy diet  [place],  Loel Betancur, NP 09/08/2016, 8:46 AM

## 2016-09-08 NOTE — ED Triage Notes (Signed)
[place] [place] [facility]. Patient reports he is hearing voices. [facility]. Also, reported to EMS that he has not been taking his medication has prescribed.

## 2016-09-08 NOTE — ED Triage Notes (Signed)
Pt to ED stating that he is hearing voices.  St's they won't stop.  [facility], st's they didn't help me.  Pt st's he is taking his medication and has not missed any

## 2016-09-08 NOTE — Discharge Instructions (Signed)
For your ongoing mental health needs, you are advised to follow up with [facility].  New and returning patients are seen at their walk-in clinic.  Walk-in hours are Monday - Friday from 8:00 am - 3:00 pm.  Walk-in patients are seen on a first come, first served basis.  Try to arrive as early as possible for he best chance of being seen the same day: °  °     [facility] °     [address] °     [place], [place] [address] °     [phone] °  °For supportive services for the homeless, contact the [facility]: °  °     [facility] °     [address] °     [place], [place] [address] °     [phone] °  ° °

## 2016-09-08 NOTE — BH Assessment (Signed)
[facility] Assessment Progress Note  Per [name], MD, this pt does not require psychiatric hospitalization at this time.  [facility].  Pt is also to be provided with referral information for the [facility], providing supportive services for the homeless.  This has been included in pt's discharge instructions.  Pt's nurse, Morrie Sheldonshley, has been notified.  Cole Canninghomas Anaija Wissink, MA Triage Specialist [phone]

## 2016-09-09 ENCOUNTER — Emergency Department (HOSPITAL_COMMUNITY)
Admission: EM | Admit: 2016-09-09 | Discharge: 2016-09-09 | Disposition: A | Discharge status: Home / Self Care | Payer: Medicaid Other | Attending: Emergency Medicine | Admitting: Emergency Medicine

## 2016-09-09 DIAGNOSIS — R440 Auditory hallucinations: Secondary | ICD-10-CM

## 2016-09-09 NOTE — ED Provider Notes (Signed)
[facility]-EMERG[facility]Y DEPT Provider Note   CSN: [id number] Arrival date & time: 09/08/16  2049     History   Chief Complaint Chief Complaint  Patient presents with  . Medical Clearance    HPI Cole Barrett is a 30 y.o. male.  30 year old male with a history of homelessness, dyslipidemia, pulmonary embolism on Xarelto, and paranoid schizophrenia presents to the emergency department after discharge at 9 AM yesterday. Patient states that he continues to hear voices telling him that they will kill him. He denies any suicidal or homicidal thoughts. Patient has been seen in an emergency department setting nearly every day since the end of September. He was admitted to behavioral health from 08/26/2016 to 08/30/2016 and returned on 09/01/2016 complaining of similar symptoms. He does report compliance with his medications. He states that he has not followed up with [facility] because they "asked me to do paperwork and shit".   The history is provided by the patient. No language interpreter was used.    Past Medical History:  Diagnosis Date  . Homelessness   . Hypercholesterolemia   . Mental disorder   . Paranoid behavior (HCC)   . PE (pulmonary embolism)   . Schizophrenia [facility]     Patient Active Problem List   Diagnosis Date Noted  . 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 Adjustment disorder with depressed mood 09/07/2016  . Homelessness 09/03/2016  . Person feigning illness 09/03/2016  . 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 Cannabis use disorder, mild, abuse 08/29/2016  . Paranoid schizophrenia (HCC) 08/26/2016  . Pulmonary emboli (HCC) 07/13/2016  . 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 Acute left flank pain 01/02/2014  . Pulmonary embolism, bilateral (HCC) 01/02/2014    History reviewed. No pertinent surgical history.     Home Medications    Prior to Admission medications   Medication Sig Start Date End Date Taking? Authorizing Provider   acetaminophen (TYLENOL) 325 MG tablet Take 650 mg by mouth every 4 (four) hours as needed for mild pain, moderate pain or headache.    Historical Provider, MD  benztropine (COGENTIN) 0.5 MG tablet Take 1 tablet (0.5 mg total) by mouth 2 (two) times daily. 08/30/16   Adonis Brook, NP  cloNIDine HCl (KAPVAY) 0.1 MG TB12 ER tablet Take 1 tablet (0.1 mg total) by mouth at bedtime. 08/30/16   Adonis Brook, NP  pravastatin (PRAVACHOL) 20 MG tablet Take 20 mg by mouth at bedtime.    Historical Provider, MD  risperiDONE (RISPERDAL) 1 MG tablet Take 1 tablet (1 mg total) by mouth every morning. 08/31/16   Adonis Brook, NP  risperiDONE (RISPERDAL) 3 MG tablet Take 1 tablet (3 mg total) by mouth at bedtime. 08/30/16   Adonis Brook, NP  Rivaroxaban (XARELTO) 15 MG TABS tablet Take 15 mg by mouth 2 (two) times daily.     Historical Provider, MD  rivaroxaban (XARELTO) 20 MG TABS tablet Take 1 tablet (20 mg total) by mouth daily with supper. 08/30/16   Adonis Brook, NP  traZODone (DESYREL) 50 MG tablet Take 50-100 mg by mouth at bedtime as needed for sleep.    Historical Provider, MD    Family History No family history on file.  Social History Social History  Substance Use Topics  . Smoking status: Current Every Day Smoker    Packs/day: 0.25    Years: 4.00    Types: Cigarettes  . Smokeless tobacco: Never Used  . Alcohol use Yes     Comment: Daily. Smells  ETOH.      Allergies   Haldol [haloperidol]   Review of Systems Review of Systems Ten systems reviewed and are negative for acute change, except as noted in the HPI.    Physical Exam Updated Vital Signs BP 126/87   Pulse 101   Temp 98.8 F (37.1 C) (Oral)   Resp 18   Ht 6' (1.829 m)   Wt 101.2 kg   SpO2 97%   BMI 30.24 kg/m   Physical Exam  Constitutional: He is oriented to person, place, and time. He appears well-developed and well-nourished. No distress.  HENT:  Head: Normocephalic and atraumatic.  Eyes: Conjunctivae  and EOM are normal. No scleral icterus.  Neck: Normal range of motion.  Cardiovascular: Normal rate, regular rhythm and intact distal pulses.   Pulmonary/Chest: Effort normal. No respiratory distress.  Respirations even and unlabored  Musculoskeletal: Normal range of motion.  Neurological: He is alert and oriented to person, place, and time. He exhibits normal muscle tone. Coordination normal.  Skin: Skin is warm and dry. No rash noted. He is not diaphoretic. No erythema. No pallor.  Psychiatric: He has a normal mood and affect. His behavior is normal. He expresses no homicidal and no suicidal ideation.  Nursing note and vitals reviewed.    ED Treatments / Results  Labs (all labs ordered are listed, but only abnormal results are displayed) Labs Reviewed  COMPREHENSIVE METABOLIC PANEL - Abnormal; Notable for the following:       Result Value   Calcium 8.8 (*)    All other components within normal limits  ACETAMINOPHEN LEVEL - Abnormal; Notable for the following:    Acetaminophen (Tylenol), Serum <10 (*)    All other components within normal limits  CBC - Abnormal; Notable for the following:    RBC 4.12 (*)    Hemoglobin 11.2 (*)    HCT 34.4 (*)    All other components within normal limits  ETHANOL  SALICYLATE LEVEL  RAPID URINE DRUG SCREEN, HOSP PERFORMED    EKG  EKG Interpretation None       Radiology No results found.  Procedures Procedures (including critical care time)  Medications Ordered in ED Medications - No data to display   Initial Impression / Assessment and Plan / ED Course  I have reviewed the triage vital signs and the nursing notes.  Pertinent labs & imaging results that were available during my care of the patient were reviewed by me and considered in my medical decision making (see chart for details).  Clinical Course    30 year old male presents to the emergency department for psychiatric evaluation. He states that he has been having  auditory hallucinations telling him that they will kill him. Question malingering as patient has been in a hospital setting every day since 08/21/2016 for similar complaints. He has had multiple psychiatric evaluations, the last of which was 18 hours ago. Patient denies SI and HI. He denies ETOH use or drug use since discharge. Labs c/w baseline. Patient encouraged to continue his medications. Will discharge with instructions for follow up with [facility]. Resource guide provided.   Final Clinical Impressions(s) / ED Diagnoses   Final diagnoses:  Auditory hallucinations    New Prescriptions New Prescriptions   No medications on file     Antony MaduraKelly Devere Brem, PA-C 09/09/16 0145    Zadie Rhineonald Wickline, MD 09/09/16 [phone]

## 2016-09-11 ENCOUNTER — Encounter (HOSPITAL_COMMUNITY): Payer: Self-pay | Admitting: Nurse Practitioner

## 2016-09-11 ENCOUNTER — Emergency Department (EMERGENCY_DEPARTMENT_HOSPITAL)
Admission: EM | Admit: 2016-09-11 | Discharge: 2016-09-12 | Disposition: A | Discharge status: Home / Self Care | Payer: Medicaid Other | Source: Home / Self Care | Attending: Emergency Medicine | Admitting: Emergency Medicine

## 2016-09-11 DIAGNOSIS — R440 Auditory hallucinations: Secondary | ICD-10-CM

## 2016-09-11 DIAGNOSIS — Z791 Long term (current) use of non-steroidal anti-inflammatories (NSAID): Secondary | ICD-10-CM | POA: Diagnosis not present

## 2016-09-11 DIAGNOSIS — Z5900 Homelessness unspecified: Secondary | ICD-10-CM

## 2016-09-11 DIAGNOSIS — F1010 Alcohol abuse, uncomplicated: Secondary | ICD-10-CM | POA: Diagnosis not present

## 2016-09-11 DIAGNOSIS — Z79899 Other long term (current) drug therapy: Secondary | ICD-10-CM

## 2016-09-11 DIAGNOSIS — F200 Paranoid schizophrenia: Secondary | ICD-10-CM | POA: Diagnosis not present

## 2016-09-11 DIAGNOSIS — Z765 Malingerer [conscious simulation]: Secondary | ICD-10-CM

## 2016-09-11 DIAGNOSIS — F17210 Nicotine dependence, cigarettes, uncomplicated: Secondary | ICD-10-CM

## 2016-09-11 DIAGNOSIS — F4321 Adjustment disorder with depressed mood: Secondary | ICD-10-CM | POA: Diagnosis not present

## 2016-09-11 DIAGNOSIS — F1014 Alcohol abuse with alcohol-induced mood disorder: Secondary | ICD-10-CM | POA: Diagnosis present

## 2016-09-11 DIAGNOSIS — F1094 Alcohol use, unspecified with alcohol-induced mood disorder: Secondary | ICD-10-CM | POA: Diagnosis not present

## 2016-09-11 DIAGNOSIS — R443 Hallucinations, unspecified: Secondary | ICD-10-CM | POA: Diagnosis present

## 2016-09-11 DIAGNOSIS — M25511 Pain in right shoulder: Secondary | ICD-10-CM | POA: Diagnosis not present

## 2016-09-11 LAB — CBC
HCT: 35.8 % — ABNORMAL LOW (ref 39.0–52.0)
HEMOGLOBIN: 12 g/dL — AB (ref 13.0–17.0)
MCH: 27.1 pg (ref 26.0–34.0)
MCHC: 33.5 g/dL (ref 30.0–36.0)
MCV: 81 fL (ref 78.0–100.0)
PLATELETS: 347 10*3/uL (ref 150–400)
RBC: 4.42 MIL/uL (ref 4.22–5.81)
RDW: 13.4 % (ref 11.5–15.5)
WBC: 5.8 10*3/uL (ref 4.0–10.5)

## 2016-09-11 LAB — COMPREHENSIVE METABOLIC PANEL
ALK PHOS: 47 U/L (ref 38–126)
ALT: 34 U/L (ref 17–63)
AST: 25 U/L (ref 15–41)
Albumin: 4.1 g/dL (ref 3.5–5.0)
Anion gap: 10 (ref 5–15)
BILIRUBIN TOTAL: 0.6 mg/dL (ref 0.3–1.2)
BUN: 11 mg/dL (ref 6–20)
CALCIUM: 9 mg/dL (ref 8.9–10.3)
CO2: 23 mmol/L (ref 22–32)
Chloride: 105 mmol/L (ref 101–111)
Creatinine, Ser: 0.83 mg/dL (ref 0.61–1.24)
GFR calc Af Amer: >60 mL/min (ref >60)
Glucose, Bld: 96 mg/dL (ref 65–99)
POTASSIUM: 3.6 mmol/L (ref 3.5–5.1)
Sodium: 138 mmol/L (ref 135–145)
TOTAL PROTEIN: 8.2 g/dL — AB (ref 6.5–8.1)

## 2016-09-11 LAB — ETHANOL: ALCOHOL ETHYL (B): 120 mg/dL — AB (ref <5)

## 2016-09-11 LAB — RAPID URINE DRUG SCREEN, HOSP PERFORMED
Amphetamines: NOT DETECTED
Barbiturates: NOT DETECTED
Benzodiazepines: NOT DETECTED
COCAINE: NOT DETECTED
OPIATES: NOT DETECTED
TETRAHYDROCANNABINOL: NOT DETECTED

## 2016-09-11 MED ORDER — PRAVASTATIN SODIUM 20 MG PO TABS
20.0000 mg | ORAL_TABLET | Freq: Every day | ORAL | Status: DC
  Administered 2016-09-11: 20 mg via ORAL
  Filled 2016-09-11: qty 1

## 2016-09-11 MED ORDER — TRAZODONE HCL 50 MG PO TABS: 50.0000 mg | ORAL_TABLET | Freq: Every evening | ORAL | Status: DC | PRN

## 2016-09-11 MED ORDER — RIVAROXABAN 20 MG PO TABS
20.0000 mg | ORAL_TABLET | Freq: Every day | ORAL | Status: DC
  Administered 2016-09-11: 20 mg via ORAL
  Filled 2016-09-11: qty 1

## 2016-09-11 MED ORDER — RISPERIDONE 2 MG PO TABS
3.0000 mg | ORAL_TABLET | Freq: Every day | ORAL | Status: DC
  Administered 2016-09-11: 3 mg via ORAL
  Filled 2016-09-11: qty 1

## 2016-09-11 MED ORDER — ALUM & MAG HYDROXIDE-SIMETH 200-200-20 MG/5ML PO SUSP: 30.0000 mL | ORAL | Status: DC | PRN

## 2016-09-11 MED ORDER — LORAZEPAM 1 MG PO TABS: 1.0000 mg | ORAL_TABLET | Freq: Three times a day (TID) | ORAL | Status: DC | PRN

## 2016-09-11 MED ORDER — RISPERIDONE 1 MG PO TABS
1.0000 mg | ORAL_TABLET | Freq: Every morning | ORAL | Status: DC
  Administered 2016-09-11 – 2016-09-12 (×2): 1 mg via ORAL
  Filled 2016-09-11 (×2): qty 1

## 2016-09-11 MED ORDER — BENZTROPINE MESYLATE 0.5 MG PO TABS
0.5000 mg | ORAL_TABLET | Freq: Two times a day (BID) | ORAL | Status: DC
  Administered 2016-09-11 – 2016-09-12 (×3): 0.5 mg via ORAL
  Filled 2016-09-11 (×3): qty 1

## 2016-09-11 MED ORDER — IBUPROFEN 200 MG PO TABS: 600.0000 mg | ORAL_TABLET | Freq: Three times a day (TID) | ORAL | Status: DC | PRN

## 2016-09-11 MED ORDER — ONDANSETRON HCL 4 MG PO TABS: 4.0000 mg | ORAL_TABLET | Freq: Three times a day (TID) | ORAL | Status: DC | PRN

## 2016-09-11 MED ORDER — ACETAMINOPHEN 325 MG PO TABS: 650.0000 mg | ORAL_TABLET | ORAL | Status: DC | PRN

## 2016-09-11 NOTE — ED Notes (Signed)
Bed: [facility]3 Expected date:  Expected time:  Means of arrival:  Comments: 

## 2016-09-11 NOTE — ED Triage Notes (Signed)
Pt is presented by [employer], reportedly bystander at a bus stop called stating that pt was "yelling at thin air." Endorses etoh intoxication, calm and cooperative at this time.

## 2016-09-11 NOTE — Progress Notes (Signed)
Disposition CSW completed patient referrals to the following inpatient psych facilities:  [facility]  CSW will follow up with patient for placement needs.  Seward SpeckLeo Alleyah Twombly [facility] HospitalCSW,LCAS Behavioral Health Disposition CSW [phone]

## 2016-09-11 NOTE — Consult Note (Signed)
[facility] Face-to-Face Psychiatry Consult   Reason for Consult:  Auditory hallucinations Referring Physician:  EDP Patient Identification: Cole Barrett MRN:  [id number] Principal Diagnosis: Paranoid schizophrenia [facility]) Diagnosis:   Patient Active Problem List   Diagnosis Date Noted  . Alcohol-induced mood disorder (HCC) [F10.94] 09/08/2016  . Adjustment disorder with depressed mood [F43.21] 09/07/2016  . Homelessness [Z59.0] 09/03/2016  . Person feigning illness [Z76.5] 09/03/2016  . 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 Cannabis use disorder, mild, abuse [F12.10] 08/29/2016  . Paranoid schizophrenia (HCC) [F20.0] 08/26/2016  . Pulmonary emboli (HCC) [I26.99] 07/13/2016  . Pulmonary embolism and infarction (HCC) [I26.99] 01/02/2014  . Acute left flank pain [R10.9] 01/02/2014  . Pulmonary embolism, bilateral (HCC) [I26.99] 01/02/2014    Total Time spent with patient: 30 minutes  Subjective:   Cole Barrett is a 30 y.o. male patient.  HPI:  Patient states to [facility] with complaints of auditory hallucinations "voices telling me they gone kill me".  Patient has had multiple inpatient hospitalization.  States that once out of the hospital he does not continue his medications and does not follow up with outpatient follow up.  States that since his last discharge "When I was in the hospital I was doing good.  Two days after I got out of there hospital the voices came back.  They just got worse over the last couple of weeks."  At this time patient denies suicidal /homicidal ideation,  Past Psychiatric History: Paranoid schizoblepharia, polysubstance abuse  Risk to Self: Suicidal Ideation: No Suicidal Intent: No Is patient at risk for suicide?: No Suicidal Plan?: No Access to Means: No What has been your use of drugs/alcohol within the last 12 months?: Alcohol and marijuana use reported.  How many times?: 0 Other Self Harm Risks: Pt denies  Triggers for Past Attempts:  None known Intentional Self Injurious Behavior: None Risk to Others: Homicidal Ideation: No Thoughts of Harm to Others: No Current Homicidal Intent: No Current Homicidal Plan: No Access to Homicidal Means: No Identified Victim: N/A History of harm to others?: No Assessment of Violence: None Noted Violent Behavior Description: No violent behaviors observed. Pt is calm and cooperative at this time.  Does patient have access to weapons?: No Criminal Charges Pending?: Yes Describe Pending Criminal Charges: Urinate in public  Does patient have a court date: Yes Court Date: 09/23/16 Prior Inpatient Therapy: Prior Inpatient Therapy: Yes Prior Therapy Dates: 2017 Prior Therapy Facilty/Provider(s): [facility] Reason for Treatment: psychosis Prior Outpatient Therapy: Prior Outpatient Therapy: No Prior Therapy Dates: na Prior Therapy Facilty/Provider(s): na Reason for Treatment: na Does patient have an ACCT team?: No Does patient have Intensive In-House Services?  : No Does patient have [facility] services? : No Does patient have P4[facility] services?: No  Past Medical History:  Past Medical History:  Diagnosis Date  . Homelessness   . Hypercholesterolemia   . Mental disorder   . Paranoid behavior (HCC)   . PE (pulmonary embolism)   . Schizophrenia (HCC)    History reviewed. No pertinent surgical history. Family History: History reviewed. No pertinent family history. Family Psychiatric  History: Denies Social History:  History  Alcohol Use  . Yes    Comment: Daily. Smells ETOH.      History  Drug Use  . Types: Marijuana    Social History   Social History  . Marital status: Single    Spouse name: N/A  . Number of children: N/A  . Years of  education: N/A   Social History Main Topics  . Smoking status: Current Every Day Smoker    Packs/day: 0.25    Years: 4.00    Types: Cigarettes  . Smokeless tobacco: Never Used  . Alcohol use Yes     Comment: Daily.  Smells ETOH.   . Drug use:     Types: Marijuana  . Sexual activity: No   Other Topics Concern  . None   Social History Narrative  . None   Additional Social History:    Allergies:   Allergies  Allergen Reactions  . Haldol [Haloperidol] Shortness Of Breath    Labs:  Results for orders placed or performed during the hospital encounter of 09/11/16 (from the past 48 hour(s))  Rapid urine drug screen (hospital performed)     Status: None   Collection Time: 09/11/16  1:10 AM  Result Value Ref Range   Opiates NONE DETECTED NONE DETECTED   Cocaine NONE DETECTED NONE DETECTED   Benzodiazepines NONE DETECTED NONE DETECTED   Amphetamines NONE DETECTED NONE DETECTED   Tetrahydrocannabinol NONE DETECTED NONE DETECTED   Barbiturates NONE DETECTED NONE DETECTED    Comment:        DRUG SCREEN FOR MEDICAL PURPOSES ONLY.  IF CONFIRMATION IS NEEDED FOR ANY PURPOSE, NOTIFY LAB WITHIN 5 DAYS.        LOWEST DETECTABLE LIMITS FOR URINE DRUG SCREEN Drug Class       Cutoff (ng/mL) Amphetamine      1000 Barbiturate      200 Benzodiazepine   353 Tricyclics       299 Opiates          300 Cocaine          300 THC              50   Comprehensive metabolic panel     Status: Abnormal   Collection Time: 09/11/16  1:18 AM  Result Value Ref Range   Sodium 138 135 - 145 mmol/L   Potassium 3.6 3.5 - 5.1 mmol/L   Chloride 105 101 - 111 mmol/L   CO2 23 22 - 32 mmol/L   Glucose, Bld 96 65 - 99 mg/dL   BUN 11 6 - 20 mg/dL   Creatinine, Ser 0.83 0.61 - 1.24 mg/dL   Calcium 9.0 8.9 - 10.3 mg/dL   Total Protein 8.2 (H) 6.5 - 8.1 g/dL   Albumin 4.1 3.5 - 5.0 g/dL   AST 25 15 - 41 U/L   ALT 34 17 - 63 U/L   Alkaline Phosphatase 47 38 - 126 U/L   Total Bilirubin 0.6 0.3 - 1.2 mg/dL   GFR calc non Af Amer >60 >60 mL/min   GFR calc Af Amer >60 >60 mL/min    Comment: (NOTE) The eGFR has been calculated using the CKD EPI equation. This calculation has not been validated in all clinical  situations. eGFR's persistently <60 mL/min signify possible Chronic Kidney Disease.    Anion gap 10 5 - 15  Ethanol     Status: Abnormal   Collection Time: 09/11/16  1:18 AM  Result Value Ref Range   Alcohol, Ethyl (B) 120 (H) <5 mg/dL    Comment:        LOWEST DETECTABLE LIMIT FOR SERUM ALCOHOL IS 5 mg/dL FOR MEDICAL PURPOSES ONLY   cbc     Status: Abnormal   Collection Time: 09/11/16  1:18 AM  Result Value Ref Range   WBC 5.8 4.0 - 10.5  K/uL   RBC 4.42 4.22 - 5.81 MIL/uL   Hemoglobin 12.0 (L) 13.0 - 17.0 g/dL   HCT 35.8 (L) 39.0 - 52.0 %   MCV 81.0 78.0 - 100.0 fL   MCH 27.1 26.0 - 34.0 pg   MCHC 33.5 30.0 - 36.0 g/dL   RDW 13.4 11.5 - 15.5 %   Platelets 347 150 - 400 K/uL    Current Facility-Administered Medications  Medication Dose Route Frequency Provider Last Rate Last Dose  . acetaminophen (TYLENOL) tablet 650 mg  650 mg Oral Q4H PRN Delsa Grana, PA-C      . alum & mag hydroxide-simeth (MAALOX/MYLANTA) 200-200-20 MG/5ML suspension 30 mL  30 mL Oral PRN Delsa Grana, PA-C      . benztropine (COGENTIN) tablet 0.5 mg  0.5 mg Oral BID Shuvon B Rankin, NP      . ibuprofen (ADVIL,MOTRIN) tablet 600 mg  600 mg Oral Q8H PRN Delsa Grana, PA-C      . LORazepam (ATIVAN) tablet 1 mg  1 mg Oral Q8H PRN Delsa Grana, PA-C      . ondansetron (ZOFRAN) tablet 4 mg  4 mg Oral Q8H PRN Delsa Grana, PA-C      . pravastatin (PRAVACHOL) tablet 20 mg  20 mg Oral QHS Shuvon B Rankin, NP      . risperiDONE (RISPERDAL) tablet 1 mg  1 mg Oral q morning - 10a Shuvon B Rankin, NP      . risperiDONE (RISPERDAL) tablet 3 mg  3 mg Oral QHS Shuvon B Rankin, NP      . rivaroxaban (XARELTO) tablet 20 mg  20 mg Oral Q supper Shuvon B Rankin, NP      . traZODone (DESYREL) tablet 50-100 mg  50-100 mg Oral QHS PRN Shuvon B Rankin, NP       Current Outpatient Prescriptions  Medication Sig Dispense Refill  . benztropine (COGENTIN) 0.5 MG tablet Take 1 tablet (0.5 mg total) by mouth 2 (two) times daily. 60  tablet 0  . pravastatin (PRAVACHOL) 20 MG tablet Take 20 mg by mouth at bedtime.    . risperiDONE (RISPERDAL) 1 MG tablet Take 1 tablet (1 mg total) by mouth every morning. 30 tablet 0  . risperiDONE (RISPERDAL) 3 MG tablet Take 1 tablet (3 mg total) by mouth at bedtime. 30 tablet 0  . rivaroxaban (XARELTO) 20 MG TABS tablet Take 1 tablet (20 mg total) by mouth daily with supper. 30 tablet 0  . traZODone (DESYREL) 50 MG tablet Take 50-100 mg by mouth at bedtime as needed for sleep.      Musculoskeletal: Strength & Muscle Tone: within normal limits Gait & Station: normal Patient leans: N/A  Psychiatric Specialty Exam: Physical Exam  Constitutional: He is oriented to person, place, and time.  Neck: Normal range of motion.  Respiratory: Effort normal.  Musculoskeletal: Normal range of motion.  Neurological: He is alert and oriented to person, place, and time.  Skin: Skin is warm and dry.  Psychiatric: His speech is normal. His mood appears anxious. He is actively hallucinating. Thought content is not delusional. He expresses impulsivity. He exhibits a depressed mood. He expresses no homicidal and no suicidal ideation.    Review of Systems  Psychiatric/Behavioral: Positive for depression, hallucinations and substance abuse. Negative for memory loss. The patient does not have insomnia.     Blood pressure 124/74, pulse 89, temperature 98.2 F (36.8 C), temperature source Oral, resp. rate 19, SpO2 97 %.There is no height or weight  on file to calculate BMI.  General Appearance: Disheveled  Eye Contact:  Good  Speech:  Clear and Coherent and Normal Rate  Volume:  Normal  Mood:  Depressed  Affect:  Depressed  Thought Process:  Goal Directed  Orientation:  Full (Time, Place, and Person)  Thought Content:  Hallucinations: Auditory  Suicidal Thoughts:  No  Homicidal Thoughts:  No  Memory:  Immediate;   Fair Recent;   Fair Remote;   Fair  Judgement:  Fair  Insight:  Fair  Psychomotor  Activity:  Normal  Concentration:  Concentration: Fair and Attention Span: Fair  Recall:  [employer] of Knowledge:  Fair  Language:  Good  Akathisia:  No  Handed:  Right  AIMS (if indicated):     Assets:  Communication Skills Desire for Improvement  ADL's:  Intact  Cognition:  WNL  Sleep:        Treatment Plan Summary: Daily contact with patient to assess and evaluate symptoms and progress in treatment, Medication management and Plan Recommend inpatient treatment  Disposition: Recommend psychiatric Inpatient admission when medically cleared.  RankinDelphia Grates, NP 09/11/2016 2:57 PM   Patient case reviewed with NP in rounds , agree with note and assessment

## 2016-09-11 NOTE — ED Notes (Signed)
Pt currently have a ham sandwich and 240 ml of sprite.  Able to eat and drink without problem.

## 2016-09-11 NOTE — ED Notes (Signed)
Transferred patient to room 31 so we can open beds up for [facility].

## 2016-09-11 NOTE — ED Notes (Signed)
Care transferred. Report received by Hui, RN 

## 2016-09-11 NOTE — ED Notes (Signed)
Pt went back to sleep after psychiatry left

## 2016-09-11 NOTE — ED Notes (Signed)
Patient sitting up on bedside enthusiastically eating lunch.

## 2016-09-11 NOTE — [facility] Assessment (Signed)
[facility]  09[facility]/16: Patient continues to meet inpatient criteria as appropriate bed placement is investigated.

## 2016-09-11 NOTE — BH Assessment (Signed)
Tele Assessment Note   Cole ChessChristopher A Stanislawski is an 30 y.o. male [facility] reporting auditory hallucinations telling him that they are going to kill him. Pt reported that he has been hearing the voices for the past month only having relief for approximately 2 days after being discharged from inpatient services. Pt stated "I am having a lot of psychological problems and I need to talk to the doctor". Pt denies SI and HI at this time. Pt did not report any previous suicide attempts or self-injurious behaviors. Pt did not report any aggressive or violent behaviors. Pt denies having access to firearms. Pt has an upcoming court date on 10/27 for public urination. Pt reported that he smokes marijuana and drinks alcohol. Pt was not very forthcoming regarding his substance use and only shared that he had a drink last night. Pt's BAL is 120. No physical, sexual or emotional abuse reported. No traumatic experiences reported.   Diagnosis: Schizophrenia   Past Medical History:  Past Medical History:  Diagnosis Date  . Homelessness   . Hypercholesterolemia   . Mental disorder   . Paranoid behavior (HCC)   . PE (pulmonary embolism)   . Schizophrenia (HCC)     History reviewed. No pertinent surgical history.  Family History: History reviewed. No pertinent family history.  Social History:  reports that he has been smoking Cigarettes.  He has a 1.00 pack-year smoking history. He has never used smokeless tobacco. He reports that he drinks alcohol. He reports that he uses drugs, including Marijuana.  Additional Social History:  Alcohol / Drug Use Pain Medications: Pt denies abuse  Prescriptions: Pt denies abuse  Over the Counter: Pt denies abuse  History of alcohol / drug use?: Yes Longest period of sobriety (when/how long): unknown  Substance #1 Name of Substance 1: ETOH 1 - Age of First Use: Teens 1 - Amount (size/oz): Two 40 oz beers 1 - Frequency: every other day 1 - Duration: Ongoing  1  - Last Use / Amount: 09-10-16 BAL-120 Substance #2 Name of Substance 2: Marijuana 2 - Age of First Use: Unknown 2 - Amount (size/oz): Unknown 2 - Frequency: Every 2-3 months 2 - Duration: ongoing  2 - Last Use / Amount: "2 months ago"   CIWA: CIWA-Ar BP: 116/82 Pulse Rate: 87 COWS:    PATIENT STRENGTHS: (choose at least two) Average or above average intelligence Motivation for treatment/growth  Allergies:  Allergies  Allergen Reactions  . Haldol [Haloperidol] Shortness Of Breath    Home Medications:  (Not in a hospital admission)  OB/GYN Status:  No LMP for male patient.  General Assessment Data [facility] of [facility]: [facility] TTS Assessment: In system Is this a Tele or Face-to-Face Assessment?: Face-to-Face Is this an Initial Assessment or a Re-assessment for this encounter?: Initial Assessment Marital status: Single Can pt return to current living arrangement?: Yes Admission Status: Voluntary Is patient capable of signing voluntary admission?: Yes Referral Source: Self/Family/Friend Insurance type: Medicaid      Crisis Care Plan Name of Psychiatrist: None Name of Therapist: None  Education Status Is patient currently in school?: No Current Grade: N/A Highest grade of school patient has completed: 12th grade Name of school: N/A Contact person: N/A  Risk to self with the past 6 months Suicidal Ideation: No Has patient been a risk to self within the past 6 months prior to admission? : No Suicidal Intent: No Has patient had any suicidal intent within the past 6 months prior to admission? :  No Is patient at risk for suicide?: No Suicidal Plan?: No Has patient had any suicidal plan within the past 6 months prior to admission? : No Access to Means: No What has been your use of drugs/alcohol within the last 12 months?: Alcohol and marijuana use reported.  Previous Attempts/Gestures: No How many times?: 0 Other Self Harm Risks: Pt denies  Triggers for Past  Attempts: None known Intentional Self Injurious Behavior: None Family Suicide History: No Recent stressful life event(s): Other (Comment) (Auditory hallucinations) Persecutory voices/beliefs?: No Depression: No Depression Symptoms:  (Pt denies ) Substance abuse history and/or treatment for substance abuse?: Yes  Risk to Others within the past 6 months Homicidal Ideation: No Does patient have any lifetime risk of violence toward others beyond the six months prior to admission? : No Thoughts of Harm to Others: No Current Homicidal Intent: No Current Homicidal Plan: No Access to Homicidal Means: No Identified Victim: N/A History of harm to others?: No Assessment of Violence: None Noted Violent Behavior Description: No violent behaviors observed. Pt is calm and cooperative at this time.  Does patient have access to weapons?: No Criminal Charges Pending?: Yes Describe Pending Criminal Charges: Urinate in public  Does patient have a court date: Yes Court Date: 09/23/16 Is patient on probation?: No  Psychosis Hallucinations: Auditory ("the voices are telling me they are going to kill me". ) Delusions: None noted  Mental Status Report Appearance/Hygiene: Unremarkable Eye Contact: Good Motor Activity: Freedom of movement Speech: Logical/coherent Level of Consciousness: Alert Mood: Pleasant Affect: Appropriate to circumstance Anxiety Level: Moderate Thought Processes: Relevant, Coherent Judgement: Impaired Orientation: Appropriate for developmental age Obsessive Compulsive Thoughts/Behaviors: None  Cognitive Functioning Concentration: Decreased Memory: Recent Intact, Remote Intact IQ: Average Insight: Fair Impulse Control: Fair Appetite: Good Weight Loss: 0 Weight Gain: 0 Sleep: No Change Total Hours of Sleep: 8 Vegetative Symptoms: None  ADLScreening [facility] Assessment Services) Patient's cognitive ability adequate to safely complete daily activities?: Yes Patient able  to express need for assistance with ADLs?: Yes Independently performs ADLs?: Yes (appropriate for developmental age)  Prior Inpatient Therapy Prior Inpatient Therapy: Yes Prior Therapy Dates: 2017 Prior Therapy Facilty/Provider(s): [facility] in [facility] Reason for Treatment: psychosis  Prior Outpatient Therapy Prior Outpatient Therapy: No Prior Therapy Dates: na Prior Therapy Facilty/Provider(s): na Reason for Treatment: na Does patient have an ACCT team?: No Does patient have Intensive In-House Services?  : No Does patient have [facility] services? : No Does patient have P4CC services?: No  ADL Screening (condition at time of admission) Patient's cognitive ability adequate to safely complete daily activities?: Yes Is the patient deaf or have difficulty hearing?: No Does the patient have difficulty seeing, even when wearing glasses/contacts?: No Does the patient have difficulty concentrating, remembering, or making decisions?: No Patient able to express need for assistance with ADLs?: Yes Does the patient have difficulty dressing or bathing?: No Independently performs ADLs?: Yes (appropriate for developmental age)       Abuse/Neglect Assessment (Assessment to be complete while patient is alone) Physical Abuse: Denies Verbal Abuse: Denies Sexual Abuse: Denies Exploitation of patient/patient's resources: Denies Self-Neglect: Denies     Merchant [employer] officer (For Healthcare) Does patient have an advance directive?: No Would patient like information on creating an advanced directive?: No - patient declined information    Additional Information 1:1 In Past 12 Months?: No CIRT Risk: No Elopement Risk: No Does patient have medical clearance?: Yes     Disposition:  Disposition Initial Assessment Completed for  this Encounter: Yes Disposition of Patient: Other dispositions (AM Psych eval ) Other disposition(s): Other (Comment) (AM Psych eval )  Takeysha Bonk  S 09/11/2016 6:58 AM

## 2016-09-11 NOTE — ED Notes (Signed)
Psychiatry at bedside.

## 2016-09-11 NOTE — ED Notes (Signed)
Patient is resting comfortably. 

## 2016-09-11 NOTE — ED Provider Notes (Signed)
[facility] DEPT Provider Note   CSN: [id number] Arrival date & time: 09/11/16  0048     History   Chief Complaint Chief Complaint  Patient presents with  . Hallucinations  . Alcohol Intoxication    HPI Cole Barrett is a 30 y.o. male.  HPI  Patient is a 30 year old male with history of schizophrenia, alcohol-induced mood disorder, hypercholesterolemia, homelessness, paranoid behavior, PE maintain on Xarelto, he presents to the ER via [employer] for evaluation of hallucinations and alcohol intoxication. He was reportedly nearby. When bystanders saw him "yelling into thin air."  911 was called and he was transported to the ER for evaluation. Patient states that he has been having auditory hallucinations of voices are telling him to kill himself. He states that he was taking his medications for hallucinations and schizophrenia. He just wants voices to stop. He denies visual hallucinations. He denies SI and HI.  He admits to drinking alcohol. He asked if we could check to see if there is [facility].  He denies any recent illness. He denies chest pain, shortness of breath, cough, abdominal pain, nausea or vomiting.  He asked for soda and sandwiches. He has no other complaints.   Past Medical History:  Diagnosis Date  . Homelessness   . Hypercholesterolemia   . Mental disorder   . Paranoid behavior (HCC)   . PE (pulmonary embolism)   . Schizophrenia [facility](HCC)     Patient Active Problem List   Diagnosis Date Noted  . 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 Adjustment disorder with depressed mood 09/07/2016  . Homelessness 09/03/2016  . Person feigning illness 09/03/2016  . 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 Cannabis use disorder, mild, abuse 08/29/2016  . Paranoid schizophrenia (HCC) 08/26/2016  . Pulmonary emboli (HCC) 07/13/2016  . 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 Acute left flank pain 01/02/2014  . Pulmonary  embolism, bilateral (HCC) 01/02/2014    History reviewed. No pertinent surgical history.     Home Medications    Prior to Admission medications   Medication Sig Start Date End Date Taking? Authorizing Provider  benztropine (COGENTIN) 0.5 MG tablet Take 1 tablet (0.5 mg total) by mouth 2 (two) times daily. 08/30/16  Yes Adonis BrookSheila Agustin, NP  pravastatin (PRAVACHOL) 20 MG tablet Take 20 mg by mouth at bedtime.   Yes Historical Provider, MD  risperiDONE (RISPERDAL) 1 MG tablet Take 1 tablet (1 mg total) by mouth every morning. 08/31/16  Yes Adonis BrookSheila Agustin, NP  risperiDONE (RISPERDAL) 3 MG tablet Take 1 tablet (3 mg total) by mouth at bedtime. 08/30/16  Yes Adonis BrookSheila Agustin, NP  rivaroxaban (XARELTO) 20 MG TABS tablet Take 1 tablet (20 mg total) by mouth daily with supper. 08/30/16  Yes Adonis BrookSheila Agustin, NP  traZODone (DESYREL) 50 MG tablet Take 50-100 mg by mouth at bedtime as needed for sleep.   Yes Historical Provider, MD  cloNIDine HCl (KAPVAY) 0.1 MG TB12 ER tablet Take 1 tablet (0.1 mg total) by mouth at bedtime. 08/30/16   Adonis BrookSheila Agustin, NP  Rivaroxaban (XARELTO) 15 MG TABS tablet Take 15 mg by mouth 2 (two) times daily.     Historical Provider, MD    Family History History reviewed. No pertinent family history.  Social History Social History  Substance Use Topics  . Smoking status: Current Every Day Smoker    Packs/day: 0.25    Years: 4.00    Types: Cigarettes  . Smokeless tobacco: Never Used  .  Alcohol use Yes     Comment: Daily. Smells ETOH.      Allergies   Haldol [haloperidol]   Review of Systems Review of Systems  All other systems reviewed and are negative.    Physical Exam Updated Vital Signs BP 116/82 (BP Location: Left Arm)   Pulse 87   Temp 98 F (36.7 C) (Oral)   Resp 18   SpO2 98%   Physical Exam  Constitutional: He is oriented to person, place, and time. He appears well-developed and well-nourished. No distress.  HENT:  Head: Normocephalic and  atraumatic.  Nose: Nose normal.  Mouth/Throat: Oropharynx is clear and moist. No oropharyngeal exudate.  Eyes: Conjunctivae and EOM are normal. Pupils are equal, round, and reactive to light. Right eye exhibits no discharge. Left eye exhibits no discharge. No scleral icterus.  Neck: Normal range of motion. No JVD present. No tracheal deviation present. No thyromegaly present.  Cardiovascular: Normal rate, regular rhythm, normal heart sounds and intact distal pulses.  Exam reveals no gallop and no friction rub.   No murmur heard. Pulmonary/Chest: Effort normal and breath sounds normal. No respiratory distress. He has no wheezes. He has no rales. He exhibits no tenderness.  Abdominal: Soft. Bowel sounds are normal. He exhibits no distension and no mass. There is no tenderness. There is no rebound and no guarding.  Musculoskeletal: Normal range of motion. He exhibits no edema or tenderness.  Lymphadenopathy:    He has no cervical adenopathy.  Neurological: He is alert and oriented to person, place, and time. He exhibits normal muscle tone. Coordination normal.  Skin: Skin is warm and dry. No rash noted. He is not diaphoretic. No erythema. No pallor.  Psychiatric: His behavior is normal. Judgment and thought content normal. His mood appears anxious. His affect is not angry and not blunt. His speech is not slurred. He is actively hallucinating. He does not express inappropriate judgment. He expresses no suicidal plans and no homicidal plans.  Nursing note and vitals reviewed.    ED Treatments / Results  Labs (all labs ordered are listed, but only abnormal results are displayed) Labs Reviewed  COMPREHENSIVE METABOLIC PANEL - Abnormal; Notable for the following:       Result Value   Total Protein 8.2 (*)    All other components within normal limits  ETHANOL - Abnormal; Notable for the following:    Alcohol, Ethyl (B) 120 (*)    All other components within normal limits  CBC - Abnormal; Notable  for the following:    Hemoglobin 12.0 (*)    HCT 35.8 (*)    All other components within normal limits  RAPID URINE DRUG SCREEN, HOSP PERFORMED    EKG  EKG Interpretation None       Radiology No results found.  Procedures Procedures (including critical care time)  Medications Ordered in ED Medications  LORazepam (ATIVAN) tablet 1 mg (not administered)  acetaminophen (TYLENOL) tablet 650 mg (not administered)  ibuprofen (ADVIL,MOTRIN) tablet 600 mg (not administered)  ondansetron (ZOFRAN) tablet 4 mg (not administered)  alum & mag hydroxide-simeth (MAALOX/MYLANTA) 200-200-20 MG/5ML suspension 30 mL (not administered)     Initial Impression / Assessment and Plan / ED Course  I have reviewed the triage vital signs and the nursing notes.  Pertinent labs & imaging results that were available during my care of the patient were reviewed by me and considered in my medical decision making (see chart for details).  Clinical Course  Patient presents to the  ER via [employer] after he was observed yelling "into thin air"  He presented to the ER very calm and cooperative, asking for admission to [facility] for further help and treatment that would make his auditory hallucination stopped because it was either telling him to kill himself. He denies any self-harm, suicidal ideations or homicidal ideations. He does have a history of schizophrenia and hallucinations. He reports compliance with the medications for PE and his psychiatric illnesses. He also admits to alcohol consumption.  He has no other complaints.  Medical clearance labs were obtained, ethanol level 120.  Patient appears clinically sober with clear speech, normal gait, eating and drinking without difficulty. He has baseline normocytic anemia. He is generally well-appearing with stable vital signs. He is medically clear at this time and disposition is pending TTS evaluation/psychiatric eval.  Psych hold orders entered.  Final Clinical  Impressions(s) / ED Diagnoses   Final diagnoses:  Auditory hallucination  Alcohol abuse    New Prescriptions New Prescriptions   No medications on file     Danelle Berry, PA-C 09/11/16 0753    Derwood Kaplan, MD 09/11/16 [phone]

## 2016-09-11 NOTE — ED Notes (Signed)
Bed: WA29 Expected date:  Expected time:  Means of arrival:  Comments: TR 3 

## 2016-09-12 ENCOUNTER — Encounter (HOSPITAL_COMMUNITY): Payer: Self-pay | Admitting: *Deleted

## 2016-09-12 ENCOUNTER — Emergency Department (HOSPITAL_COMMUNITY)
Admission: EM | Admit: 2016-09-12 | Discharge: 2016-09-13 | Disposition: A | Discharge status: Home / Self Care | Payer: Medicaid Other | Attending: Emergency Medicine | Admitting: Emergency Medicine

## 2016-09-12 DIAGNOSIS — F4321 Adjustment disorder with depressed mood: Secondary | ICD-10-CM | POA: Insufficient documentation

## 2016-09-12 DIAGNOSIS — F17210 Nicotine dependence, cigarettes, uncomplicated: Secondary | ICD-10-CM | POA: Diagnosis not present

## 2016-09-12 DIAGNOSIS — Z79899 Other long term (current) drug therapy: Secondary | ICD-10-CM | POA: Insufficient documentation

## 2016-09-12 DIAGNOSIS — Z5900 Homelessness unspecified: Secondary | ICD-10-CM

## 2016-09-12 DIAGNOSIS — Z791 Long term (current) use of non-steroidal anti-inflammatories (NSAID): Secondary | ICD-10-CM | POA: Diagnosis not present

## 2016-09-12 DIAGNOSIS — F1010 Alcohol abuse, uncomplicated: Secondary | ICD-10-CM | POA: Diagnosis not present

## 2016-09-12 DIAGNOSIS — Z765 Malingerer [conscious simulation]: Secondary | ICD-10-CM

## 2016-09-12 DIAGNOSIS — F1094 Alcohol use, unspecified with alcohol-induced mood disorder: Secondary | ICD-10-CM

## 2016-09-12 LAB — CBC WITH DIFFERENTIAL/PLATELET
Basophils Absolute: 0 10*3/uL (ref 0.0–0.1)
Basophils Relative: 1 %
Eosinophils Absolute: 0.5 10*3/uL (ref 0.0–0.7)
Eosinophils Relative: 9 %
HEMATOCRIT: 34.6 % — AB (ref 39.0–52.0)
HEMOGLOBIN: 11.2 g/dL — AB (ref 13.0–17.0)
LYMPHS ABS: 1.5 10*3/uL (ref 0.7–4.0)
Lymphocytes Relative: 26 %
MCH: 27.3 pg (ref 26.0–34.0)
MCHC: 32.4 g/dL (ref 30.0–36.0)
MCV: 84.4 fL (ref 78.0–100.0)
MONO ABS: 0.6 10*3/uL (ref 0.1–1.0)
MONOS PCT: 10 %
NEUTROS ABS: 3.3 10*3/uL (ref 1.7–7.7)
NEUTROS PCT: 56 %
Platelets: 327 10*3/uL (ref 150–400)
RBC: 4.1 MIL/uL — ABNORMAL LOW (ref 4.22–5.81)
RDW: 13.6 % (ref 11.5–15.5)
WBC: 6 10*3/uL (ref 4.0–10.5)

## 2016-09-12 LAB — COMPREHENSIVE METABOLIC PANEL
ALBUMIN: 3.9 g/dL (ref 3.5–5.0)
ALK PHOS: 47 U/L (ref 38–126)
ALT: 29 U/L (ref 17–63)
ANION GAP: 8 (ref 5–15)
AST: 21 U/L (ref 15–41)
BILIRUBIN TOTAL: 0.5 mg/dL (ref 0.3–1.2)
BUN: 15 mg/dL (ref 6–20)
CALCIUM: 8.8 mg/dL — AB (ref 8.9–10.3)
CO2: 24 mmol/L (ref 22–32)
Chloride: 106 mmol/L (ref 101–111)
Creatinine, Ser: 1.06 mg/dL (ref 0.61–1.24)
GFR calc Af Amer: >60 mL/min (ref >60)
GFR calc non Af Amer: >60 mL/min (ref >60)
GLUCOSE: 123 mg/dL — AB (ref 65–99)
Potassium: 4 mmol/L (ref 3.5–5.1)
SODIUM: 138 mmol/L (ref 135–145)
Total Protein: 7.5 g/dL (ref 6.5–8.1)

## 2016-09-12 LAB — RAPID URINE DRUG SCREEN, HOSP PERFORMED
Amphetamines: NOT DETECTED
BARBITURATES: NOT DETECTED
Benzodiazepines: NOT DETECTED
Cocaine: NOT DETECTED
Opiates: NOT DETECTED
TETRAHYDROCANNABINOL: NOT DETECTED

## 2016-09-12 LAB — ETHANOL: Alcohol, Ethyl (B): <5 mg/dL (ref <5)

## 2016-09-12 NOTE — ED Notes (Signed)
Patient is resting comfortably. 

## 2016-09-12 NOTE — ED Notes (Signed)
Pt presents with HI, hearing voices saying, "I will kill someone."  Denies SI or visual hallucinations.  Denies feeling hopeless.  Admits to history of Bipolar DO.  Denies drug or alcohol use.  A&O x 3, no distress noted, calm & cooperative.  Monitoring for safety, Q 15 min checks in effect.

## 2016-09-12 NOTE — BHH Suicide Risk Assessment (Signed)
Suicide Risk Assessment  Discharge Assessment   [facility]   Principal Problem: Alcohol-induced mood disorder [facility](HCC) Discharge Diagnoses:  Patient Active Problem List   Diagnosis Date Noted  . 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 Adjustment disorder with depressed mood [F43.21] 09/07/2016    Priority: High  . Homelessness [Z59.0] 09/03/2016    Priority: High  . Person feigning illness [Z76.5] 09/03/2016    Priority: High  . Alcohol abuse [F10.10]   . Auditory hallucination [R44.0]   . 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 Cannabis use disorder, mild, abuse [F12.10] 08/29/2016  . Paranoid schizophrenia (HCC) [F20.0] 08/26/2016  . Pulmonary emboli (HCC) [I26.99] 07/13/2016  . Pulmonary embolism and infarction (HCC) [I26.99] 01/02/2014  . Acute left flank pain [R10.9] 01/02/2014  . Pulmonary embolism, bilateral (HCC) [I26.99] 01/02/2014    Total Time spent with patient: 45 minutes  Musculoskeletal: Strength & Muscle Tone: within normal limits Gait & Station: normal Patient leans: N/A  Psychiatric Specialty Exam: Physical Exam  Constitutional: He is oriented to person, place, and time. He appears well-developed and well-nourished.  HENT:  Head: Normocephalic.  Neck: Normal range of motion.  Respiratory: Effort normal.  Musculoskeletal: Normal range of motion.  Neurological: He is alert and oriented to person, place, and time.  Skin: Skin is warm and dry.  Psychiatric: He has a normal mood and affect. His speech is normal and behavior is normal. Judgment and thought content normal. Cognition and memory are normal.    Review of Systems  Constitutional: Negative.   HENT: Negative.   Eyes: Negative.   Respiratory: Negative.   Cardiovascular: Negative.   Gastrointestinal: Negative.   Genitourinary: Negative.   Musculoskeletal: Negative.   Skin: Negative.   Neurological: Negative.    Endo/Heme/Allergies: Negative.   Psychiatric/Behavioral: Positive for substance abuse.    Blood pressure 107/79, pulse 83, temperature 97.6 F (36.4 C), temperature source Oral, resp. rate 17, SpO2 96 %.There is no height or weight on file to calculate BMI.  General Appearance: Casual  Eye Contact:  Good  Speech:  Normal Rate  Volume:  Normal  Mood:  Irritable, mild  Affect:  Congruent  Thought Process:  Coherent and Descriptions of Associations: Intact  Orientation:  Full (Time, Place, and Person)  Thought Content:  WDL  Suicidal Thoughts:  No  Homicidal Thoughts:  No  Memory:  Immediate;   Good Recent;   Good Remote;   Good  Judgement:  Fair  Insight:  Fair  Psychomotor Activity:  Normal  Concentration:  Concentration: Good and Attention Span: Good  Recall:  Good  Fund of Knowledge:  Fair  Language:  Good  Akathisia:  No  Handed:  Right  AIMS (if indicated):     Assets:  Leisure Time Physical Health Resilience  ADL's:  Intact  Cognition:  WNL  Sleep:      Mental Status Per Nursing Assessment::   On Admission:   hallucinations after drinking alcohol  Demographic Factors:  Male  Loss Factors: NA  Historical Factors: NA  Risk Reduction Factors:   Sense of responsibility to family  Continued Clinical Symptoms:  None  Cognitive Features That Contribute To Risk:  None    Suicide Risk:  Minimal: No identifiable suicidal ideation.  Patients presenting with no risk factors but with morbid ruminations; may be classified as minimal risk based on the severity of the depressive symptoms    Plan Of Care/Follow-up recommendations:  Activity:  as tolerated Diet:  heart healthy diet  LORD, JAMISON, NP 09/12/2016, 9:03 AM

## 2016-09-12 NOTE — ED Notes (Signed)
Pt has in his belongings bag a pair of black pants, a black long sleeve shirt a pair of sneakers and socks. Pt has in a separate bag a black bag with cds. Pt has been wanded by security.

## 2016-09-12 NOTE — ED Triage Notes (Signed)
Pt states he is hearing voices that say they will kill him. Pt stated he wants to kill someone . stated, "I am just going to kill someone."Pt [facility] have someone watch him to make sure he takes his medications.

## 2016-09-12 NOTE — BH Assessment (Signed)
BHH Assessment Progress Note  Per Mojeed Akintayo, MD, this pt does not require psychiatric hospitalization at this time.  [facility] from [facility] with recommendation to follow up with [facility].  Pt is also to be provided with referral information for the [facility], providing supportive services for the homeless.  This has been included in pt's discharge instructions.  Pt's nurse, Ashley, has been notified.  Rudy Luhmann, MA Triage Specialist [phone]     

## 2016-09-12 NOTE — ED Triage Notes (Signed)
Pt escorted to ED by [employer]. Pt reports auditory hallucinations, states voices are saying they want to kill him. Pt states the voices make him want to "kill someone". Pt [facility] inpatient for a few weeks.

## 2016-09-12 NOTE — Discharge Instructions (Signed)
For your ongoing mental health needs, you are advised to follow up with [facility]. New and returning patients are seen at their walk-in clinic. Walk-in hours are Monday - Friday from 8:00 am - 3:00 pm. Walk-in patients are seen on a first come, first served basis. Try to arrive as early as possible for he best chance of being seen the same day:  [facility] [address] [place], [place] [address] [phone]  For supportive services for the homeless, contact the [facility]:  [facility] [address] [place], [place] [address] [phone]

## 2016-09-12 NOTE — ED Provider Notes (Addendum)
[facility]-EMERGENCY DEPT Provider Note   CSN: [id number] Arrival date & time: 09/12/16  1929     History   Chief Complaint Chief Complaint  Patient presents with  . Homicidal  . Hallucinations    HPI Cole Barrett is a 30 y.o. male.  HPI  CC: HI  Onset/Duration: 1 day Timing: constant Severity: moderate Modifying Factors:  Improved by: nothing tried  Worsened by: nothin Associated Signs/Symptoms:  Pertinent (+): auditory hallucination telling him to kill himself and that they are going to kill him.  Pertinent (-): Visual hallucinations  Context: was seen here for EtOH intoxication and AH with SI. Seen by [facility] and cleared for OP treatment. Brought in by PD, called by Pt, because of the HI and AH. Pt states "I'm a kill somebody." Does not elaborate any further.  Denies EtOH use today. Denies other recreational drug use.   Past Medical History:  Diagnosis Date  . Homelessness   . Hypercholesterolemia   . Mental disorder   . Paranoid behavior (HCC)   . PE (pulmonary embolism)   . Schizophrenia [facility])     Patient Active Problem List   Diagnosis Date Noted  . Alcohol abuse   . Auditory hallucination   . 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 Adjustment disorder with depressed mood 09/07/2016  . Homelessness 09/03/2016  . Person feigning illness 09/03/2016  . 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 Cannabis use disorder, mild, abuse 08/29/2016  . Paranoid schizophrenia (HCC) 08/26/2016  . Pulmonary emboli (HCC) 07/13/2016  . 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 Acute left flank pain 01/02/2014  . Pulmonary embolism, bilateral (HCC) 01/02/2014    History reviewed. No pertinent surgical history.     Home Medications    Prior to Admission medications   Medication Sig Start Date End Date Taking? Authorizing Provider  benztropine (COGENTIN) 0.5 MG tablet Take 1 tablet (0.5 mg total) by mouth 2 (two) times daily. 08/30/16  Yes  Adonis Brook, NP  pravastatin (PRAVACHOL) 20 MG tablet Take 20 mg by mouth at bedtime.   Yes Historical Provider, MD  risperiDONE (RISPERDAL) 1 MG tablet Take 1 tablet (1 mg total) by mouth every morning. 08/31/16  Yes Adonis Brook, NP  risperiDONE (RISPERDAL) 3 MG tablet Take 1 tablet (3 mg total) by mouth at bedtime. 08/30/16  Yes Adonis Brook, NP  rivaroxaban (XARELTO) 20 MG TABS tablet Take 1 tablet (20 mg total) by mouth daily with supper. 08/30/16  Yes Adonis Brook, NP  traZODone (DESYREL) 50 MG tablet Take 50-100 mg by mouth at bedtime as needed for sleep.    Historical Provider, MD    Family History History reviewed. No pertinent family history.  Social History Social History  Substance Use Topics  . Smoking status: Current Every Day Smoker    Packs/day: 0.25    Years: 4.00    Types: Cigarettes  . Smokeless tobacco: Never Used  . Alcohol use 1.2 oz/week    2 Cans of beer per week     Comment: Daily. Smells ETOH.      Allergies   Haldol [haloperidol]   Review of Systems Review of Systems  Constitutional: Negative for chills and fever.  HENT: Negative for ear pain and sore throat.   Eyes: Negative for pain and visual disturbance.  Respiratory: Negative for cough and shortness of breath.   Cardiovascular: Negative for chest pain and palpitations.  Gastrointestinal: Negative for abdominal pain and vomiting.  Genitourinary: Negative for dysuria and hematuria.  Musculoskeletal: Negative for arthralgias and back pain.  Skin: Negative for color change and rash.  Neurological: Negative for seizures and syncope.  Psychiatric/Behavioral: Positive for behavioral problems.  All other systems reviewed and are negative.    Physical Exam Updated Vital Signs BP 120/78 (BP Location: Left Arm)   Pulse 106   Temp 97.9 F (36.6 C) (Oral)   Resp 18   Ht 5\' 10"  (1.778 m)   Wt 225 lb (102.1 kg)   SpO2 97%   BMI 32.28 kg/m   Physical Exam  Constitutional: He is  oriented to person, place, and time. He appears well-developed and well-nourished. No distress.  HENT:  Head: Normocephalic and atraumatic.  Nose: Nose normal.  Eyes: Conjunctivae and EOM are normal. Pupils are equal, round, and reactive to light. Right eye exhibits no discharge. Left eye exhibits no discharge. No scleral icterus.  Neck: Normal range of motion. Neck supple.  Cardiovascular: Normal rate and regular rhythm.  Exam reveals no gallop and no friction rub.   No murmur heard. Pulmonary/Chest: Effort normal and breath sounds normal. No stridor. No respiratory distress. He has no rales.  Abdominal: Soft. He exhibits no distension. There is no tenderness.  Musculoskeletal: He exhibits no edema or tenderness.  Neurological: He is alert and oriented to person, place, and time.  Skin: Skin is warm and dry. No rash noted. He is not diaphoretic. No erythema.  Psychiatric: He has a normal mood and affect. He expresses homicidal ideation.  Vitals reviewed.    ED Treatments / Results  Labs (all labs ordered are listed, but only abnormal results are displayed) Labs Reviewed  COMPREHENSIVE METABOLIC PANEL - Abnormal; Notable for the following:       Result Value   Glucose, Bld 123 (*)    Calcium 8.8 (*)    All other components within normal limits  CBC WITH DIFFERENTIAL/PLATELET - Abnormal; Notable for the following:    RBC 4.10 (*)    Hemoglobin 11.2 (*)    HCT 34.6 (*)    All other components within normal limits  ETHANOL  RAPID URINE DRUG SCREEN, HOSP PERFORMED    EKG  EKG Interpretation None       Radiology No results found.  Procedures Procedures (including critical care time)  Medications Ordered in ED Medications - No data to display   Initial Impression / Assessment and Plan / ED Course  I have reviewed the triage vital signs and the nursing notes.  Pertinent labs & imaging results that were available during my care of the patient were reviewed by me and  considered in my medical decision making (see chart for details).  Clinical Course    Clear with medical screening labs. Behavioral Health consulted. Currently awaiting their evaluation and recommendations.    Nira ConnPedro Eduardo Cardama, MD 09/13/16 [id number]    Nira ConnPedro Eduardo Cardama, MD 09/30/16 [phone]

## 2016-09-12 NOTE — Consult Note (Signed)
[place] [facility] Consult   Reason for Consult:  Hallucinations  Referring Physician:  EDP Patient Identification: Cole Barrett MRN:  [id number] Principal Diagnosis: Alcohol induced mood disorder Diagnosis:   Patient Active Problem List   Diagnosis Date Noted  . Alcohol-induced mood disorder ([place]) [F10.94] 09/08/2016    Priority: High  . Adjustment disorder with depressed mood [F43.21] 09/07/2016    Priority: High  . Homelessness [Z59.0] 09/03/2016    Priority: High  . Person feigning illness [Z76.5] 09/03/2016    Priority: High  . Auditory hallucination [R44.0]   . 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 Cannabis use disorder, mild, abuse [F12.10] 08/29/2016  . Paranoid schizophrenia ([place]) [F20.0] 08/26/2016  . Pulmonary emboli ([place]) [I26.99] 07/13/2016  . Pulmonary embolism and infarction ([place]) [I26.99] 01/02/2014  . Acute left flank pain [R10.9] 01/02/2014  . Pulmonary embolism, bilateral ([place]) [I26.99] 01/02/2014    Total Time spent with patient: 45 minutes  Subjective:   Cole Barrett is a 30 y.o. male patient does not warrant admission.  HPI:  30 yo male who presented to the ED with hallucinations.  He was just released from [facility] where he was bullying other patients and demanding services.  Since this time, he has not gone to his outpatient appointment but continues to come to the ED for shelter.  He was discharged from [facility] ED and went to [facility] and was sent here.  Not responding to internal stimuli, no suicidal/homicidal ideations or alcohol/drug abuse.  Stable for discharge.  Encouraged him to go to [facility] and given [facility] resources.  This morning the patient returned after drinking alcohol and stated the voices were telling him to kill himself, denied suicidal/homicidal ideations and withdrawal symptoms.  Today, he is clear and coherent and encouraged to use his resources to go to [place] if his medications are not working.  Past  Psychiatric History: substance abuse with psychosis  Risk to Self: Suicidal Ideation: No Suicidal Intent: No Is patient at risk for suicide?: No Suicidal Plan?: No Access to Means: No What has been your use of drugs/alcohol within the last 12 months?: Alcohol and marijuana use reported.  How many times?: 0 Other Self Harm Risks: Pt denies  Triggers for Past Attempts: None known Intentional Self Injurious Behavior: None Risk to Others: Homicidal Ideation: No Thoughts of Harm to Others: No Current Homicidal Intent: No Current Homicidal Plan: No Access to Homicidal Means: No Identified Victim: N/A History of harm to others?: No Assessment of Violence: None Noted Violent Behavior Description: No violent behaviors observed. Pt is calm and cooperative at this time.  Does patient have access to weapons?: No Criminal Charges Pending?: Yes Describe Pending Criminal Charges: Urinate in public  Does patient have a court date: Yes Court Date: 10/27/17none Prior Inpatient Therapy: Prior Inpatient Therapy: Yes Prior Therapy Dates: 2017 Prior Therapy Facilty/Provider(s): [facility] in [facility] Reason for Treatment: [place]  Prior Outpatient Therapy: Prior Outpatient Therapy: No Prior Therapy Dates: na Prior Therapy Facilty/Provider(s): na Reason for Treatment: na Does patient have an ACCT team?: No Does patient have Intensive In-House Services?  : No Does patient have [facility] services? : No Does [facility]  Past Medical History:  Past Medical History:  Diagnosis Date  . Homelessness   . Hypercholesterolemia   . Mental disorder   . Paranoid behavior ([place])   . PE (pulmonary embolism)   . Schizophrenia ([place])    History reviewed. No pertinent surgical history.  Family History: History reviewed. No pertinent family history. Family Psychiatric  History: none Social History:  History  Alcohol Use  . Yes    Comment: Daily. Smells ETOH.       History  Drug Use  . Types: Marijuana    Social History   Social History  . Marital status: Single    Spouse name: N/A  . Number of children: N/A  . Years of education: N/A   Social History Main Topics  . Smoking status: Current Every Day Smoker    Packs/day: 0.25    Years: 4.00    Types: Cigarettes  . Smokeless tobacco: Never Used  . Alcohol use Yes     Comment: Daily. Smells ETOH.   . Drug use:     Types: Marijuana  . Sexual activity: No   Other Topics Concern  . None   Social History Narrative  . None   Additional Social History:    Allergies:   Allergies  Allergen Reactions  . Haldol [Haloperidol] Shortness Of Breath    Labs:  Results for orders placed or performed during the hospital encounter of 09/11/16 (from the past 48 hour(s))  Rapid urine drug screen (hospital performed)     Status: None   Collection Time: 09/11/16  1:10 AM  Result Value Ref Range   Opiates NONE DETECTED NONE DETECTED   Cocaine NONE DETECTED NONE DETECTED   Benzodiazepines NONE DETECTED NONE DETECTED   Amphetamines NONE DETECTED NONE DETECTED   Tetrahydrocannabinol NONE DETECTED NONE DETECTED   Barbiturates NONE DETECTED NONE DETECTED    Comment:        DRUG SCREEN FOR MEDICAL PURPOSES ONLY.  IF CONFIRMATION IS NEEDED FOR ANY PURPOSE, NOTIFY LAB WITHIN 5 DAYS.        LOWEST DETECTABLE LIMITS FOR URINE DRUG SCREEN Drug Class       Cutoff (ng/mL) Amphetamine      1000 Barbiturate      200 Benzodiazepine   824 Tricyclics       235 Opiates          300 Cocaine          300 THC              50   Comprehensive metabolic panel     Status: Abnormal   Collection Time: 09/11/16  1:18 AM  Result Value Ref Range   Sodium 138 135 - 145 mmol/L   Potassium 3.6 3.5 - 5.1 mmol/L   Chloride 105 101 - 111 mmol/L   CO2 23 22 - 32 mmol/L   Glucose, Bld 96 65 - 99 mg/dL   BUN 11 6 - 20 mg/dL   Creatinine, Ser 0.83 0.61 - 1.24 mg/dL   Calcium 9.0 8.9 - 10.3 mg/dL   Total Protein 8.2  (H) 6.5 - 8.1 g/dL   Albumin 4.1 3.5 - 5.0 g/dL   AST 25 15 - 41 U/L   ALT 34 17 - 63 U/L   Alkaline Phosphatase 47 38 - 126 U/L   Total Bilirubin 0.6 0.3 - 1.2 mg/dL   GFR calc non Af Amer >60 >60 mL/min   GFR calc Af Amer >60 >60 mL/min    Comment: (NOTE) The eGFR has been calculated using the CKD EPI equation. This calculation has not been validated in all clinical situations. eGFR's persistently <60 mL/min signify possible Chronic Kidney Disease.    Anion gap 10 5 - 15  Ethanol     Status: Abnormal  Collection Time: 09/11/16  1:18 AM  Result Value Ref Range   Alcohol, Ethyl (B) 120 (H) <5 mg/dL    Comment:        LOWEST DETECTABLE LIMIT FOR SERUM ALCOHOL IS 5 mg/dL FOR MEDICAL PURPOSES ONLY   cbc     Status: Abnormal   Collection Time: 09/11/16  1:18 AM  Result Value Ref Range   WBC 5.8 4.0 - 10.5 K/uL   RBC 4.42 4.22 - 5.81 MIL/uL   Hemoglobin 12.0 (L) 13.0 - 17.0 g/dL   HCT 35.8 (L) 39.0 - 52.0 %   MCV 81.0 78.0 - 100.0 fL   MCH 27.1 26.0 - 34.0 pg   MCHC 33.5 30.0 - 36.0 g/dL   RDW 13.4 11.5 - 15.5 %   Platelets 347 150 - 400 K/uL    Current Facility-Administered Medications  Medication Dose Route Frequency Provider Last Rate Last Dose  . acetaminophen (TYLENOL) tablet 650 mg  650 mg Oral Q4H PRN Delsa Grana, PA-C      . alum & mag hydroxide-simeth (MAALOX/MYLANTA) 200-200-20 MG/5ML suspension 30 mL  30 mL Oral PRN Delsa Grana, PA-C      . benztropine (COGENTIN) tablet 0.5 mg  0.5 mg Oral BID Shuvon B Rankin, NP   0.5 mg at 09/11/16 2104  . ibuprofen (ADVIL,MOTRIN) tablet 600 mg  600 mg Oral Q8H PRN Delsa Grana, PA-C      . ondansetron (ZOFRAN) tablet 4 mg  4 mg Oral Q8H PRN Delsa Grana, PA-C      . pravastatin (PRAVACHOL) tablet 20 mg  20 mg Oral QHS Shuvon B Rankin, NP   20 mg at 09/11/16 2105  . risperiDONE (RISPERDAL) tablet 1 mg  1 mg Oral q morning - 10a Shuvon B Rankin, NP   1 mg at 09/11/16 1652  . risperiDONE (RISPERDAL) tablet 3 mg  3 mg Oral QHS  Shuvon B Rankin, NP   3 mg at 09/11/16 2104  . rivaroxaban (XARELTO) tablet 20 mg  20 mg Oral Q supper Shuvon B Rankin, NP   20 mg at 09/11/16 1725  . traZODone (DESYREL) tablet 50-100 mg  50-100 mg Oral QHS PRN Shuvon B Rankin, NP       Current Outpatient Prescriptions  Medication Sig Dispense Refill  . benztropine (COGENTIN) 0.5 MG tablet Take 1 tablet (0.5 mg total) by mouth 2 (two) times daily. 60 tablet 0  . pravastatin (PRAVACHOL) 20 MG tablet Take 20 mg by mouth at bedtime.    . risperiDONE (RISPERDAL) 1 MG tablet Take 1 tablet (1 mg total) by mouth every morning. 30 tablet 0  . risperiDONE (RISPERDAL) 3 MG tablet Take 1 tablet (3 mg total) by mouth at bedtime. 30 tablet 0  . rivaroxaban (XARELTO) 20 MG TABS tablet Take 1 tablet (20 mg total) by mouth daily with supper. 30 tablet 0  . traZODone (DESYREL) 50 MG tablet Take 50-100 mg by mouth at bedtime as needed for sleep.      Musculoskeletal: Strength & Muscle Tone: within normal limits Gait & Station: normal Patient leans: N/A  Psychiatric Specialty Exam: Physical Exam  Constitutional: He is oriented to person, place, and time. He appears well-developed and well-nourished.  HENT:  Head: Normocephalic.  Neck: Normal range of motion.  Respiratory: Effort normal.  Musculoskeletal: Normal range of motion.  Neurological: He is alert and oriented to person, place, and time.  Skin: Skin is warm and dry.  Psychiatric: He has a normal mood and affect. His  speech is normal and behavior is normal. Judgment and thought content normal. Cognition and memory are normal.    Review of Systems  Constitutional: Negative.   HENT: Negative.   Eyes: Negative.   Respiratory: Negative.   Cardiovascular: Negative.   Gastrointestinal: Negative.   Genitourinary: Negative.   Musculoskeletal: Negative.   Skin: Negative.   Neurological: Negative.   Endo/Heme/Allergies: Negative.   Psychiatric/Behavioral: Positive for substance abuse.    Blood  pressure 107/79, pulse 83, temperature 97.6 F (36.4 C), temperature source Oral, resp. rate 17, SpO2 96 %.There is no height or weight on file to calculate BMI.  General Appearance: Casual  Eye Contact:  Good  Speech:  Normal Rate  Volume:  Normal  Mood:  Irritable, mild  Affect:  Congruent  Thought Process:  Coherent and Descriptions of Associations: Intact  Orientation:  Full (Time, Place, and Person)  Thought Content:  WDL  Suicidal Thoughts:  No  Homicidal Thoughts:  No  Memory:  Immediate;   Good Recent;   Good Remote;   Good  Judgement:  Fair  Insight:  Fair  Psychomotor Activity:  Normal  Concentration:  Concentration: Good and Attention Span: Good  Recall:  Good  Fund of Knowledge:  Fair  Language:  Good  Akathisia:  No  Handed:  Right  AIMS (if indicated):     Assets:  Leisure Time Physical Health Resilience  ADL's:  Intact  Cognition:  WNL  Sleep:        Treatment Plan Summary: Daily contact with patient to assess and evaluate symptoms and progress in treatment, Medication management and Plan adjustment disorder with anxiety:  -Crisis stabilization -Medication management:  Medical medications continued, Risperdal changed to Saphris 5 mg BID for psychosis.  Continued cogentin 0.5 mg BID and Trazodone 50 mg at bedtime for sleep. -Individual counseling -[facility] referral along with [facility]/homeless resources.  Disposition: No evidence of imminent risk to self or others at present.    Waylan Boga, NP 09/12/2016 9:01 AM  Patient seen face-to-face for psychiatric evaluation, chart reviewed and case discussed with the physician extender and developed treatment plan. Reviewed the information documented and agree with the treatment plan. Corena Pilgrim, MD

## 2016-09-13 ENCOUNTER — Encounter (HOSPITAL_COMMUNITY): Payer: Self-pay

## 2016-09-13 ENCOUNTER — Emergency Department (HOSPITAL_COMMUNITY): Payer: Medicaid Other

## 2016-09-13 DIAGNOSIS — M25511 Pain in right shoulder: Secondary | ICD-10-CM | POA: Insufficient documentation

## 2016-09-13 DIAGNOSIS — F4321 Adjustment disorder with depressed mood: Secondary | ICD-10-CM

## 2016-09-13 DIAGNOSIS — Y9302 Activity, running: Secondary | ICD-10-CM | POA: Insufficient documentation

## 2016-09-13 DIAGNOSIS — W228XXA Striking against or struck by other objects, initial encounter: Secondary | ICD-10-CM | POA: Diagnosis not present

## 2016-09-13 DIAGNOSIS — F17210 Nicotine dependence, cigarettes, uncomplicated: Secondary | ICD-10-CM

## 2016-09-13 DIAGNOSIS — Y929 Unspecified place or not applicable: Secondary | ICD-10-CM | POA: Insufficient documentation

## 2016-09-13 DIAGNOSIS — Y999 Unspecified external cause status: Secondary | ICD-10-CM | POA: Insufficient documentation

## 2016-09-13 MED ORDER — PRAVASTATIN SODIUM 20 MG PO TABS: 20.0000 mg | ORAL_TABLET | Freq: Every day | ORAL | Status: DC

## 2016-09-13 MED ORDER — TRAZODONE HCL 50 MG PO TABS: 50.0000 mg | ORAL_TABLET | Freq: Every evening | ORAL | Status: DC | PRN

## 2016-09-13 MED ORDER — BENZTROPINE MESYLATE 0.5 MG PO TABS: 0.5000 mg | ORAL_TABLET | Freq: Two times a day (BID) | ORAL | Status: DC

## 2016-09-13 MED ORDER — RIVAROXABAN 20 MG PO TABS: 20.0000 mg | ORAL_TABLET | Freq: Every day | ORAL | Status: DC

## 2016-09-13 MED ORDER — RISPERIDONE 2 MG PO TABS: 3.0000 mg | ORAL_TABLET | Freq: Every day | ORAL | Status: DC

## 2016-09-13 MED ORDER — RISPERIDONE 1 MG PO TABS: 1.0000 mg | ORAL_TABLET | Freq: Every morning | ORAL | Status: DC

## 2016-09-13 NOTE — ED Triage Notes (Signed)
Patient bib [facility] c/o right arm and shoulder pain.  Patient stated he "ran into concrete".  Patient ambulatory to triage.

## 2016-09-13 NOTE — Discharge Instructions (Addendum)
For your ongoing mental health needs, you are advised to follow up with [facility].  New and returning patients are seen at their walk-in clinic.  Walk-in hours are Monday - Friday from 8:00 am - 3:00 pm.  Walk-in patients are seen on a first come, first served basis.  Try to arrive as early as possible for he best chance of being seen the same day: °  °     [facility] °     [address] °     [place], [place] [address] °     [phone] °  °For supportive services for the homeless, contact the [facility]: °  °     [facility] °     [address] °     [place], [place] [address] °     [phone] °  ° °

## 2016-09-13 NOTE — ED Notes (Signed)
Pt was discharged with security and police escort.  Discharge instructions were given and pt was [facility] also to use outpatient services instead of emergency room services.  Pt was in no distress and he was calm and cooperative.

## 2016-09-13 NOTE — [facility] Suicide Risk Assessment (Signed)
Suicide Risk Assessment  Discharge Assessment   [facility]   Principal Problem: Adjustment disorder with depressed mood Discharge Diagnoses:  Patient Active Problem List   Diagnosis Date Noted  . 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 Adjustment disorder with depressed mood [F43.21] 09/07/2016    Priority: High  . Homelessness [Z59.0] 09/03/2016    Priority: High  . Person feigning illness [Z76.5] 09/03/2016    Priority: High  . Alcohol abuse [F10.10]   . Auditory hallucination [R44.0]   . 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 Cannabis use disorder, mild, abuse [F12.10] 08/29/2016  . Paranoid schizophrenia (HCC) [F20.0] 08/26/2016  . Pulmonary emboli (HCC) [I26.99] 07/13/2016  . Pulmonary embolism and infarction (HCC) [I26.99] 01/02/2014  . Acute left flank pain [R10.9] 01/02/2014  . Pulmonary embolism, bilateral (HCC) [I26.99] 01/02/2014    Total Time spent with patient: 45 minutes  Musculoskeletal: Strength & Muscle Tone: within normal limits Gait & Station: normal Patient leans: N/A  Psychiatric Specialty Exam: Physical Exam  Constitutional: He is oriented to person, place, and time. He appears well-developed and well-nourished.  HENT:  Head: Normocephalic.  Neck: Normal range of motion.  Respiratory: Effort normal.  Musculoskeletal: Normal range of motion.  Neurological: He is alert and oriented to person, place, and time.  Skin: Skin is warm and dry.  Psychiatric: He has a normal mood and affect. His speech is normal and behavior is normal. Judgment and thought content normal. Cognition and memory are normal.    Review of Systems  Constitutional: Negative.   HENT: Negative.   Eyes: Negative.   Respiratory: Negative.   Cardiovascular: Negative.   Gastrointestinal: Negative.   Genitourinary: Negative.   Musculoskeletal: Negative.   Skin: Negative.   Neurological: Negative.    Endo/Heme/Allergies: Negative.   Psychiatric/Behavioral: Positive for substance abuse.    Blood pressure 120/56, pulse 66, temperature 97.7 F (36.5 C), temperature source Oral, resp. rate 18, height 5\' 10"  (1.778 m), weight 102.1 kg (225 lb), SpO2 100 %.Body mass index is 32.28 kg/m.  General Appearance: Casual  Eye Contact:  Good  Speech:  Normal Rate  Volume:  Normal  Mood:  Irritable, mild  Affect:  Congruent  Thought Process:  Coherent and Descriptions of Associations: Intact  Orientation:  Full (Time, Place, and Person)  Thought Content:  WDL  Suicidal Thoughts:  No  Homicidal Thoughts:  No  Memory:  Immediate;   Good Recent;   Good Remote;   Good  Judgement:  Fair  Insight:  Fair  Psychomotor Activity:  Normal  Concentration:  Concentration: Good and Attention Span: Good  Recall:  Good  Fund of Knowledge:  Fair  Language:  Good  Akathisia:  No  Handed:  Right  AIMS (if indicated):     Assets:  Leisure Time Physical Health Resilience  ADL's:  Intact  Cognition:  WNL  Sleep:       Mental Status Per Nursing Assessment::   On Admission:   hallucinations   Demographic Factors:  Male  Loss Factors: NA  Historical Factors: NA  Risk Reduction Factors:   Sense of responsibility to family  Continued Clinical Symptoms:  None  Cognitive Features That Contribute To Risk:  None    Suicide Risk:  Minimal: No identifiable suicidal ideation.  Patients presenting with no risk factors but with morbid ruminations; may be classified as minimal risk based on the severity of the depressive symptoms    Plan  Of Care/Follow-up recommendations:  Activity:  as tolerated Diet:  heart healthy diet  Deepika [place], NP 09/13/2016, 8:37 AM

## 2016-09-13 NOTE — BH Assessment (Signed)
Tele Assessment Note   Cole Barrett is an 30 y.o. male who presents to the ED voluntarily. Pt reports he hears voices that tell him they are going to kill him and then he is going to kill someone else. Pt reports he does not feel safe to be d/c and continued to request to stay at the hospital. Pt stated "I need to stay here for at least 2 weeks or even a week or maybe just a few days to see a nurse so they can watch me and talk to a doctor so they can make sure I take my medication so I don't hurt nobody." Pt reports he does not feel suicidal and does not have a plan. Pt denies visual hallucinations and reports he does not feel safe alone. Pt reports he has been taking medication for months but was unable to identify the name or type of medication. Pt reports he "hears someone talking in his head." Pt denies drug use and denies prior OPT. [facility] and [facility]    Per Donell SievertSpencer Simon, PA pt will need an AM psych eval. Ronnell FreshwaterLatricia L London, RN has been notified of the disposition.  Diagnosis: Schizophrenia   Past Medical History:  Past Medical History:  Diagnosis Date   Homelessness    Hypercholesterolemia    Mental disorder    Paranoid behavior (HCC)    PE (pulmonary embolism)    Schizophrenia (HCC)     History reviewed. No pertinent surgical history.  Family History: History reviewed. No pertinent family history.  Social History:  reports that he has been smoking Cigarettes.  He has a 1.00 pack-year smoking history. He has never used smokeless tobacco. He reports that he drinks about 1.2 oz of alcohol per week . He reports that he uses drugs, including Marijuana.  Additional Social History:  Alcohol / Drug Use Pain Medications: Pt denies abuse Prescriptions: pt denies abuse Over the Counter: pt denies abuse  History of alcohol / drug use?:  (denies)  CIWA: CIWA-Ar BP: 121/63 Pulse Rate: 79 COWS:    PATIENT  STRENGTHS: (choose at least two) Communication skills General fund of knowledge Motivation for treatment/growth  Allergies:  Allergies  Allergen Reactions   Haldol [Haloperidol] Shortness Of Breath    Home Medications:  (Not in a hospital admission)  OB/GYN Status:  No LMP for male patient.  General Assessment Data [facility] of Assessment: [facility] TTS Assessment: In system Is this a Tele or Face-to-Face Assessment?: Face-to-Face Is this an Initial Assessment or a Re-assessment for this encounter?: Initial Assessment Marital status: Single Is patient pregnant?: No Pregnancy Status: No Living Arrangements: Alone Can pt return to current living arrangement?: No (pt reports he does not feel safe to return to his living arr) Admission Status: Voluntary Is patient capable of signing voluntary admission?: Yes Referral Source: Self/Family/Friend Insurance type: Medicaid     Crisis Care Plan Living Arrangements: Alone Name of Psychiatrist: None Name of Therapist: None  Education Status Is patient currently in school?: No Highest grade of school patient has completed: 12th  Risk to self with the past 6 months Suicidal Ideation: No Has patient been a risk to self within the past 6 months prior to admission? : No Suicidal Intent: No Has patient had any suicidal intent within the past 6 months prior to admission? : No Is patient at risk for suicide?: No Suicidal Plan?: No Has patient had any suicidal plan within  the past 6 months prior to admission? : No Access to Means: No What has been your use of drugs/alcohol within the last 12 months?: denies Previous Attempts/Gestures: No Triggers for Past Attempts: None known Intentional Self Injurious Behavior: None Family Suicide History: No Recent stressful life event(s): Other (Comment) (hallucinations) Persecutory voices/beliefs?: No Depression: No Substance abuse history and/or treatment for substance abuse?: No Suicide  prevention information given to non-admitted patients: Not applicable  Risk to Others within the past 6 months Homicidal Ideation: Yes-Currently Present Does patient have any lifetime risk of violence toward others beyond the six months prior to admission? : Yes (comment) (pt reports voices are telling him to kill someone) Thoughts of Harm to Others: Yes-Currently Present Comment - Thoughts of Harm to Others: pt reports voices are telling him that they are going to kill him and he is going to kill someone Current Homicidal Intent: Yes-Currently Present Current Homicidal Plan: No Access to Homicidal Means: No History of harm to others?: No Assessment of Violence: None Noted Does patient have access to weapons?: No Criminal Charges Pending?: Yes Describe Pending Criminal Charges: pt reports he was urinating in public Does patient have a court date: Yes Court Date: 09/18/16 Is patient on probation?: No  Psychosis Hallucinations: Auditory, With command Delusions: None noted  Mental Status Report Appearance/Hygiene: Unable to Assess (could not see the pt due to angle of the camera) Eye Contact: Unable to Assess Motor Activity: Unable to assess Speech: Logical/coherent, Slurred Level of Consciousness: Drowsy Mood: Suspicious, Irritable Affect: Unable to Assess Anxiety Level: Minimal Thought Processes: Relevant, Coherent Judgement: Impaired Orientation: Person, Place, Time, Situation, Appropriate for developmental age Obsessive Compulsive Thoughts/Behaviors: Minimal  Cognitive Functioning Concentration: Fair Memory: Remote Intact, Recent Intact IQ: Average Insight: Poor Impulse Control: Poor Appetite: Good Sleep: No Change Total Hours of Sleep: 8 Vegetative Symptoms: None  ADLScreening [facility] Assessment Services) Patient's cognitive ability adequate to safely complete daily activities?: Yes Patient able to express need for assistance with ADLs?: Yes Independently performs  ADLs?: Yes (appropriate for developmental age)  Prior Inpatient Therapy Prior Inpatient Therapy: Yes Prior Therapy Dates: 2017 Prior Therapy Facilty/Provider(s): [facility] & facility in [facility] Reason for Treatment: psychosis  Prior Outpatient Therapy Prior Outpatient Therapy: No Does patient have an ACCT team?: No Does patient have Intensive In-House Services?  : No Does patient have [facility] services? : No Does patient have P4CC services?: No  ADL Screening (condition at time of admission) Patient's cognitive ability adequate to safely complete daily activities?: Yes Is the patient deaf or have difficulty hearing?: No Does the patient have difficulty seeing, even when wearing glasses/contacts?: No Does the patient have difficulty concentrating, remembering, or making decisions?: No Patient able to express need for assistance with ADLs?: Yes Does the patient have difficulty dressing or bathing?: No Independently performs ADLs?: Yes (appropriate for developmental age) Does the patient have difficulty walking or climbing stairs?: No Weakness of Legs: None Weakness of Arms/Hands: None  Home Assistive Devices/Equipment Home Assistive Devices/Equipment: None    Abuse/Neglect Assessment (Assessment to be complete while patient is alone) Physical Abuse: Denies Verbal Abuse: Denies Sexual Abuse: Denies Exploitation of patient/patient's resources: Denies Self-Neglect: Denies     Merchant navy officer (For Healthcare) Does patient have an advance directive?: No Would patient like information on creating an advanced directive?: No - patient declined information    Additional Information 1:1 In Past 12 Months?: No CIRT Risk: No Elopement Risk: No Does patient have medical clearance?: Yes  Disposition:  Disposition Initial Assessment Completed for this Encounter: Yes Disposition of Patient: Other dispositions Other disposition(s): Other (Comment) (AM psych  eval)  [name] 09/13/2016 3:25 AM

## 2016-09-13 NOTE — ED Triage Notes (Signed)
Patient states that he ran into "cement"  Patient did not elaborate on how.  Patient states that he feels like it is broken.

## 2016-09-13 NOTE — Consult Note (Addendum)
[facility] Consult   Reason for Consult:  Hallucinations  Referring Physician:  EDP Patient Identification: Cole Barrett MRN:  [id number] Principal Diagnosis: Adjustment disorder with depressed mood Diagnosis:   Patient Active Problem List   Diagnosis Date Noted  . Alcohol-induced mood disorder ([place]) [F10.94] 09/08/2016    Priority: High  . Adjustment disorder with depressed mood [F43.21] 09/07/2016    Priority: High  . Homelessness [Z59.0] 09/03/2016    Priority: High  . Person feigning illness [Z76.5] 09/03/2016    Priority: High  . Alcohol abuse [F10.10]   . Auditory hallucination [R44.0]   . 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 Cannabis use disorder, mild, abuse [F12.10] 08/29/2016  . Paranoid schizophrenia ([place]) [F20.0] 08/26/2016  . Pulmonary emboli ([place]) [I26.99] 07/13/2016  . Pulmonary embolism and infarction ([place]) [I26.99] 01/02/2014  . Acute left flank pain [R10.9] 01/02/2014  . Pulmonary embolism, bilateral ([place]) [I26.99] 01/02/2014    Total Time spent with patient: 45 minutes  Subjective:   Cole Barrett is a 30 y.o. male patient does not warrant admission.  HPI:  30 yo male who presented to the ED with hallucinations.  He was just released from [facility] where he was bullying other patients and demanding services.  Since this time, he has not gone to his outpatient appointment but continues to come to the ED for shelter.  He was discharged from [facility] ED and went to [facility] and was sent here.  Not responding to internal stimuli, no suicidal/homicidal ideations or alcohol/drug abuse.  Stable for discharge.  Encouraged him to go to [place] and given [facility] resources.  This morning the patient returned after drinking alcohol and stated the voices were telling him to kill himself, denied suicidal/homicidal ideations and withdrawal symptoms.  Today, he is clear and coherent and encouraged to use his resources to go to [place] if his  medications are not working.  Past Psychiatric History: substance abuse with psychosis  Risk to Self: Suicidal Ideation: No Suicidal Intent: No Is patient at risk for suicide?: No Suicidal Plan?: No Access to Means: No What has been your use of drugs/alcohol within the last 12 months?: denies Triggers for Past Attempts: None known Intentional Self Injurious Behavior: None Risk to Others: Homicidal Ideation: Yes-Currently Present Thoughts of Harm to Others: Yes-Currently Present Comment - Thoughts of Harm to Others: pt reports voices are telling him that they are going to kill him and he is going to kill someone Current Homicidal Intent: Yes-Currently Present Current Homicidal Plan: No Access to Homicidal Means: No History of harm to others?: No Assessment of Violence: None Noted Does patient have access to weapons?: No Criminal Charges Pending?: Yes Describe Pending Criminal Charges: pt reports he was urinating in public Does patient have a court date: Yes Court Date: 10/22/17none Prior Inpatient Therapy: Prior Inpatient Therapy: Yes Prior Therapy Dates: 2017 Prior Therapy Facilty/Provider(s): [facility] & facility in [facility] Reason for Treatment: [place]  Prior Outpatient Therapy: Prior Outpatient Therapy: No Does patient have an ACCT team?: No Does patient have Intensive In-House Services?  : No Does patient have [facility] services? : No Does patient have [facility] services?: [facility]  Past Medical History:  Past Medical History:  Diagnosis Date  . Homelessness   . Hypercholesterolemia   . Mental disorder   . Paranoid behavior ([place])   . PE (pulmonary embolism)   . Schizophrenia ([place])    History reviewed. No pertinent surgical history. Family History: History reviewed. No pertinent family  history. Family Psychiatric  History: none Social History:  History  Alcohol Use  . 1.2 oz/week  . 2 Cans of beer per week    Comment: Daily. Smells ETOH.      History  Drug  Use  . Types: Marijuana    Comment: once every 3 months    Social History   Social History  . Marital status: Single    Spouse name: N/A  . Number of children: N/A  . Years of education: N/A   Social History Main Topics  . Smoking status: Current Every Day Smoker    Packs/day: 0.25    Years: 4.00    Types: Cigarettes  . Smokeless tobacco: Never Used  . Alcohol use 1.2 oz/week    2 Cans of beer per week     Comment: Daily. Smells ETOH.   . Drug use:     Types: Marijuana     Comment: once every 3 months  . Sexual activity: No   Other Topics Concern  . None   Social History Narrative  . None   Additional Social History:    Allergies:   Allergies  Allergen Reactions  . Haldol [Haloperidol] Shortness Of Breath    Labs:  Results for orders placed or performed during the hospital encounter of 09/12/16 (from the past 48 hour(s))  Urine rapid drug screen (hosp performed)not at [facility]     Status: None   Collection Time: 09/12/16  9:59 PM  Result Value Ref Range   Opiates NONE DETECTED NONE DETECTED   Cocaine NONE DETECTED NONE DETECTED   Benzodiazepines NONE DETECTED NONE DETECTED   Amphetamines NONE DETECTED NONE DETECTED   Tetrahydrocannabinol NONE DETECTED NONE DETECTED   Barbiturates NONE DETECTED NONE DETECTED    Comment:        DRUG SCREEN FOR MEDICAL PURPOSES ONLY.  IF CONFIRMATION IS NEEDED FOR ANY PURPOSE, NOTIFY LAB WITHIN 5 DAYS.        LOWEST DETECTABLE LIMITS FOR URINE DRUG SCREEN Drug Class       Cutoff (ng/mL) Amphetamine      1000 Barbiturate      200 Benzodiazepine   536 Tricyclics       144 Opiates          300 Cocaine          300 THC              50   Comprehensive metabolic panel     Status: Abnormal   Collection Time: 09/12/16 10:10 PM  Result Value Ref Range   Sodium 138 135 - 145 mmol/L   Potassium 4.0 3.5 - 5.1 mmol/L   Chloride 106 101 - 111 mmol/L   CO2 24 22 - 32 mmol/L   Glucose, Bld 123 (H) 65 - 99 mg/dL   BUN 15 6 - 20  mg/dL   Creatinine, Ser 1.06 0.61 - 1.24 mg/dL   Calcium 8.8 (L) 8.9 - 10.3 mg/dL   Total Protein 7.5 6.5 - 8.1 g/dL   Albumin 3.9 3.5 - 5.0 g/dL   AST 21 15 - 41 U/L   ALT 29 17 - 63 U/L   Alkaline Phosphatase 47 38 - 126 U/L   Total Bilirubin 0.5 0.3 - 1.2 mg/dL   GFR calc non Af Amer >60 >60 mL/min   GFR calc Af Amer >60 >60 mL/min    Comment: (NOTE) The eGFR has been calculated using the CKD EPI equation. This calculation has not been validated in  all clinical situations. eGFR's persistently <60 mL/min signify possible Chronic Kidney Disease.    Anion gap 8 5 - 15  Ethanol     Status: None   Collection Time: 09/12/16 10:10 PM  Result Value Ref Range   Alcohol, Ethyl (B) <5 <5 mg/dL    Comment:        LOWEST DETECTABLE LIMIT FOR SERUM ALCOHOL IS 5 mg/dL FOR MEDICAL PURPOSES ONLY   CBC with Diff     Status: Abnormal   Collection Time: 09/12/16 10:10 PM  Result Value Ref Range   WBC 6.0 4.0 - 10.5 K/uL   RBC 4.10 (L) 4.22 - 5.81 MIL/uL   Hemoglobin 11.2 (L) 13.0 - 17.0 g/dL   HCT 34.6 (L) 39.0 - 52.0 %   MCV 84.4 78.0 - 100.0 fL   MCH 27.3 26.0 - 34.0 pg   MCHC 32.4 30.0 - 36.0 g/dL   RDW 13.6 11.5 - 15.5 %   Platelets 327 150 - 400 K/uL   Neutrophils Relative % 56 %   Neutro Abs 3.3 1.7 - 7.7 K/uL   Lymphocytes Relative 26 %   Lymphs Abs 1.5 0.7 - 4.0 K/uL   Monocytes Relative 10 %   Monocytes Absolute 0.6 0.1 - 1.0 K/uL   Eosinophils Relative 9 %   Eosinophils Absolute 0.5 0.0 - 0.7 K/uL   Basophils Relative 1 %   Basophils Absolute 0.0 0.0 - 0.1 K/uL    Current Facility-Administered Medications  Medication Dose Route Frequency Provider Last Rate Last Dose  . benztropine (COGENTIN) tablet 0.5 mg  0.5 mg Oral BID Fatima Blank, MD      . pravastatin (PRAVACHOL) tablet 20 mg  20 mg Oral QHS Fatima Blank, MD      . risperiDONE (RISPERDAL) tablet 1 mg  1 mg Oral q morning - 10a Fatima Blank, MD      . risperiDONE (RISPERDAL) tablet 3 mg   3 mg Oral QHS Fatima Blank, MD      . rivaroxaban Alveda Reasons) tablet 20 mg  20 mg Oral Q supper Fatima Blank, MD      . traZODone (DESYREL) tablet 50-100 mg  50-100 mg Oral QHS PRN Fatima Blank, MD       Current Outpatient Prescriptions  Medication Sig Dispense Refill  . benztropine (COGENTIN) 0.5 MG tablet Take 1 tablet (0.5 mg total) by mouth 2 (two) times daily. 60 tablet 0  . pravastatin (PRAVACHOL) 20 MG tablet Take 20 mg by mouth at bedtime.    . risperiDONE (RISPERDAL) 1 MG tablet Take 1 tablet (1 mg total) by mouth every morning. 30 tablet 0  . risperiDONE (RISPERDAL) 3 MG tablet Take 1 tablet (3 mg total) by mouth at bedtime. 30 tablet 0  . rivaroxaban (XARELTO) 20 MG TABS tablet Take 1 tablet (20 mg total) by mouth daily with supper. 30 tablet 0  . traZODone (DESYREL) 50 MG tablet Take 50-100 mg by mouth at bedtime as needed for sleep.      Musculoskeletal: Strength & Muscle Tone: within normal limits Gait & Station: normal Patient leans: N/A  Psychiatric Specialty Exam: Physical Exam  Constitutional: He is oriented to person, place, and time. He appears well-developed and well-nourished.  HENT:  Head: Normocephalic.  Neck: Normal range of motion.  Respiratory: Effort normal.  Musculoskeletal: Normal range of motion.  Neurological: He is alert and oriented to person, place, and time.  Skin: Skin is warm and dry.  Psychiatric:  He has a normal mood and affect. His speech is normal and behavior is normal. Judgment and thought content normal. Cognition and memory are normal.    Review of Systems  Constitutional: Negative.   HENT: Negative.   Eyes: Negative.   Respiratory: Negative.   Cardiovascular: Negative.   Gastrointestinal: Negative.   Genitourinary: Negative.   Musculoskeletal: Negative.   Skin: Negative.   Neurological: Negative.   Endo/Heme/Allergies: Negative.   Psychiatric/Behavioral: Positive for substance abuse.    Blood  pressure 120/56, pulse 66, temperature 97.7 F (36.5 C), temperature source Oral, resp. rate 18, height _0  (1.778 m), weight 102.1 kg (225 lb), SpO2 100 %.Body mass index is 32.28 kg/m.  General Appearance: Casual  Eye Contact:  Good  Speech:  Normal Rate  Volume:  Normal  Mood:  Irritable, mild  Affect:  Congruent  Thought Process:  Coherent and Descriptions of Associations: Intact  Orientation:  Full (Time, Place, and Person)  Thought Content:  WDL  Suicidal Thoughts:  No  Homicidal Thoughts:  No  Memory:  Immediate;   Good Recent;   Good Remote;   Good  Judgement:  Fair  Insight:  Fair  Psychomotor Activity:  Normal  Concentration:  Concentration: Good and Attention Span: Good  Recall:  Good  Fund of Knowledge:  Fair  Language:  Good  Akathisia:  No  Handed:  Right  AIMS (if indicated):     Assets:  Leisure Time Physical Health Resilience  ADL's:  Intact  Cognition:  WNL  Sleep:        Treatment Plan Summary: Adjustment disorder with depressed mood -Crisis stabilization -Medication management:  Medical medications continued, Risperdal changed to Saphris 5 mg BID for psychosis.  Continued cogentin 0.5 mg BID and Trazodone 50 mg at bedtime for sleep. -Individual counseling -[facility] referral along with [facility]/homeless resources.  Disposition: No evidence of imminent risk to self or others at present.    Waylan Boga, NP 09/13/2016 8:36 AM  Patient seen face-to-face for psychiatric evaluation, chart reviewed and case discussed with the physician extender and developed treatment plan. Reviewed the information documented and agree with the treatment plan. Corena Pilgrim, MD

## 2016-09-13 NOTE — BH Assessment (Signed)
[facility] Assessment Progress Note  Per Mojeed Akintayo, MD, this pt does not require psychiatric hospitalization at this time.  Pt is [facility] be [facility] from [facility] with recommendation to follow up with [facility].  Pt is also to be provided with referral information for [facility], providing supportive services for the homeless.  This has been included in pt's discharge instructions.  Pt's nurse, Ashley, has been notified.  Johnwilliam Shepperson, MA Triage Specialist [phone]     

## 2016-09-14 ENCOUNTER — Encounter (HOSPITAL_COMMUNITY): Payer: Self-pay | Admitting: Emergency Medicine

## 2016-09-14 ENCOUNTER — Emergency Department (HOSPITAL_COMMUNITY)
Admission: EM | Admit: 2016-09-14 | Discharge: 2016-09-14 | Disposition: A | Discharge status: Home / Self Care | Payer: Medicaid Other | Attending: Emergency Medicine | Admitting: Emergency Medicine

## 2016-09-14 ENCOUNTER — Emergency Department (HOSPITAL_COMMUNITY): Payer: Medicaid Other

## 2016-09-14 ENCOUNTER — Emergency Department (HOSPITAL_COMMUNITY)
Admission: EM | Admit: 2016-09-14 | Discharge: 2016-09-15 | Disposition: A | Discharge status: Home / Self Care | Payer: Medicaid Other | Source: Home / Self Care | Attending: Emergency Medicine | Admitting: Emergency Medicine

## 2016-09-14 DIAGNOSIS — Y929 Unspecified place or not applicable: Secondary | ICD-10-CM | POA: Insufficient documentation

## 2016-09-14 DIAGNOSIS — S40021A Contusion of right upper arm, initial encounter: Secondary | ICD-10-CM | POA: Insufficient documentation

## 2016-09-14 DIAGNOSIS — F17210 Nicotine dependence, cigarettes, uncomplicated: Secondary | ICD-10-CM | POA: Insufficient documentation

## 2016-09-14 DIAGNOSIS — W2201XA Walked into wall, initial encounter: Secondary | ICD-10-CM

## 2016-09-14 DIAGNOSIS — Y999 Unspecified external cause status: Secondary | ICD-10-CM | POA: Insufficient documentation

## 2016-09-14 DIAGNOSIS — Z7901 Long term (current) use of anticoagulants: Secondary | ICD-10-CM

## 2016-09-14 DIAGNOSIS — Y9301 Activity, walking, marching and hiking: Secondary | ICD-10-CM | POA: Insufficient documentation

## 2016-09-14 DIAGNOSIS — M25511 Pain in right shoulder: Secondary | ICD-10-CM

## 2016-09-14 MED ORDER — CYCLOBENZAPRINE HCL 10 MG PO TABS: 10.0000 mg | ORAL_TABLET | Freq: Two times a day (BID) | ORAL | 0 refills | Status: DC | PRN

## 2016-09-14 NOTE — ED Provider Notes (Addendum)
[facility]-EMERGENCY DEPT Provider Note   CSN: [id number] Arrival date & time: 09/13/16  2340  By signing my name below, I, Majel Homer, attest that this documentation has been prepared under the direction and in the presence of Gilda Crease, MD . Electronically Signed: Majel Homer, Scribe. 09/14/2016. 1:19 AM.  History   Chief Complaint Chief Complaint  Patient presents with  . Shoulder Pain   The history is provided by the patient. No language interpreter was used.   HPI Comments: Cole Barrett is a 30 y.o. male with PMHx of homelessness,who presents to the Emergency Department complaining of gradually worsening, right arm and shoulder pain that began earlier today. Pt reports he "ran into concrete too hard" but did not elaborate the mechanism of how this happened. Pt is insistent that he wants "narcotic medication" for his pain.   Past Medical History:  Diagnosis Date  . Homelessness   . Hypercholesterolemia   . Mental disorder   . Paranoid behavior (HCC)   . PE (pulmonary embolism)   . Schizophrenia [facility])     Patient Active Problem List   Diagnosis Date Noted  . Alcohol abuse   . Auditory hallucination   . 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 Adjustment disorder with depressed mood 09/07/2016  . Homelessness 09/03/2016  . Person feigning illness 09/03/2016  . 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 Cannabis use disorder, mild, abuse 08/29/2016  . Paranoid schizophrenia (HCC) 08/26/2016  . Pulmonary emboli (HCC) 07/13/2016  . 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 Acute left flank pain 01/02/2014  . Pulmonary embolism, bilateral (HCC) 01/02/2014   History reviewed. No pertinent surgical history.  Home Medications    Prior to Admission medications   Medication Sig Start Date End Date Taking? Authorizing Provider  benztropine (COGENTIN) 0.5 MG tablet Take 1 tablet (0.5 mg total) by mouth 2 (two) times daily. 08/30/16   Adonis Brook, NP  cyclobenzaprine (FLEXERIL) 10 MG tablet Take 1 tablet (10 mg total) by mouth 2 (two) times daily as needed for muscle spasms. 09/14/16   Gilda Crease, MD  pravastatin (PRAVACHOL) 20 MG tablet Take 20 mg by mouth at bedtime.    Historical Provider, MD  risperiDONE (RISPERDAL) 1 MG tablet Take 1 tablet (1 mg total) by mouth every morning. 08/31/16   Adonis Brook, NP  risperiDONE (RISPERDAL) 3 MG tablet Take 1 tablet (3 mg total) by mouth at bedtime. 08/30/16   Adonis Brook, NP  rivaroxaban (XARELTO) 20 MG TABS tablet Take 1 tablet (20 mg total) by mouth daily with supper. 08/30/16   Adonis Brook, NP  traZODone (DESYREL) 50 MG tablet Take 50-100 mg by mouth at bedtime as needed for sleep.    Historical Provider, MD    Family History No family history on file.  Social History Social History  Substance Use Topics  . Smoking status: Current Every Day Smoker    Packs/day: 0.25    Years: 4.00    Types: Cigarettes  . Smokeless tobacco: Never Used  . Alcohol use 1.2 oz/week    2 Cans of beer per week     Comment: Daily. Smells ETOH.    Allergies   Haldol [haloperidol]   Review of Systems Review of Systems  Musculoskeletal: Positive for arthralgias.  All other systems reviewed and are negative.    Physical Exam Updated Vital Signs BP 109/76 (BP Location: Left Arm)   Pulse 85   Temp 98 F (36.7 C) (Oral)  Resp 15   SpO2 97%   Physical Exam  Constitutional: He is oriented to person, place, and time. He appears well-developed and well-nourished. No distress.  HENT:  Head: Normocephalic and atraumatic.  Right Ear: Hearing normal.  Left Ear: Hearing normal.  Nose: Nose normal.  Mouth/Throat: Oropharynx is clear and moist and mucous membranes are normal.  Eyes: Conjunctivae and EOM are normal. Pupils are equal, round, and reactive to light.  Neck: Normal range of motion. Neck supple.  Cardiovascular: Regular rhythm, S1 normal and S2 normal.  Exam reveals  no gallop and no friction rub.   No murmur heard. Pulmonary/Chest: Effort normal and breath sounds normal. No respiratory distress. He exhibits no tenderness.  Abdominal: Soft. Normal appearance and bowel sounds are normal. There is no hepatosplenomegaly. There is no tenderness. There is no rebound, no guarding, no tenderness at McBurney's point and negative Murphy's sign. No hernia.  Musculoskeletal: Normal range of motion.       Right shoulder: He exhibits tenderness. He exhibits normal range of motion and no deformity.  Neurological: He is alert and oriented to person, place, and time. He has normal strength. No cranial nerve deficit or sensory deficit. Coordination normal. GCS eye subscore is 4. GCS verbal subscore is 5. GCS motor subscore is 6.  Skin: Skin is warm, dry and intact. No rash noted. No cyanosis.  Psychiatric: He has a normal mood and affect. His speech is normal and behavior is normal. Thought content normal.  Nursing note and vitals reviewed.  ED Treatments / Results  Labs (all labs ordered are listed, but only abnormal results are displayed) Labs Reviewed - No data to display  EKG  EKG Interpretation None       Radiology Dg Shoulder Right  Result Date: 09/14/2016 CLINICAL DATA:  Acute onset of right shoulder pain after running into concrete. Initial encounter. EXAM: RIGHT SHOULDER - 2+ VIEW COMPARISON:  None. FINDINGS: There is no evidence of fracture or dislocation. The right humeral head is seated within the glenoid fossa. The acromioclavicular joint is unremarkable in appearance. No significant soft tissue abnormalities are seen. The visualized portions of the right lung are clear. IMPRESSION: No evidence of fracture or dislocation. Electronically Signed   By: Roanna Raider M.D.   On: 09/14/2016 00:51    Procedures Procedures (including critical care time)  Medications Ordered in ED Medications - No data to display  DIAGNOSTIC STUDIES:  Oxygen Saturation  is 97% on RA, normal by my interpretation.    COORDINATION OF CARE:  1:14 AM Discussed treatment plan with pt at bedside and pt agreed to plan.  Initial Impression / Assessment and Plan / ED Course  I have reviewed the triage vital signs and the nursing notes.  Pertinent labs & imaging results that were available during my care of the patient were reviewed by me and considered in my medical decision making (see chart for details).  Clinical Course    Patient reports hitting his right shoulder on a piece of concrete prior to arrival. No other injury. No deformity noted. X-ray negative. Patient asking for "pain pills". Patient redirected, informed he would not require narcotics. He also cannot be given NSAIDs because of Xarelto use.  I personally performed the services described in this documentation, which was scribed in my presence. The recorded information has been reviewed and is accurate.   Final Clinical Impressions(s) / ED Diagnoses   Final diagnoses:  Acute pain of right shoulder    New Prescriptions  New Prescriptions   CYCLOBENZAPRINE (FLEXERIL) 10 MG TABLET    Take 1 tablet (10 mg total) by mouth 2 (two) times daily as needed for muscle spasms.     Gilda Creasehristopher J Jerre Vandrunen, MD 09/14/16 0130    Gilda Creasehristopher J Merrill Deanda, MD 09/14/16 [phone]

## 2016-09-14 NOTE — ED Triage Notes (Signed)
Pt c/o right arm pain, worsening with movement.  States "I think I slept on it wrong on the concrete".  Pt denies any SOB, CP, Dizziness.  Sts pain began last night.

## 2016-09-14 NOTE — ED Notes (Signed)
Patient verbalizes understanding of discharge instructions, prescriptions, home care and follow up care. Patient out of department at this time. 

## 2016-09-15 MED ORDER — ACETAMINOPHEN 325 MG PO TABS
650.0000 mg | ORAL_TABLET | Freq: Once | ORAL | Status: AC
  Administered 2016-09-15: 650 mg via ORAL
  Filled 2016-09-15: qty 2

## 2016-09-15 NOTE — ED Notes (Signed)
Pt seen in FT.  See NP assessment

## 2016-09-15 NOTE — Discharge Instructions (Signed)
Take tylenol as needed for pain

## 2016-09-15 NOTE — ED Provider Notes (Signed)
[facility]-EMERGENCY DEPT Provider Note   CSN: [id number] Arrival date & time: 09/14/16  2230     History   Chief Complaint Chief Complaint  Patient presents with  . Arm Pain    HPI Cole Barrett is a 30 y.o. homeless male with multiple mental health problems who presents to the ED with arm pain. Patient reports he was walking yesterday and hit his arm against a concrete wall. He continues to have pain in the upper arm. He has taken nothing for pain. He denies any other injuries.   HPI  Past Medical History:  Diagnosis Date  . Homelessness   . Hypercholesterolemia   . Mental disorder   . Paranoid behavior (HCC)   . PE (pulmonary embolism)   . Schizophrenia [facility](HCC)     Patient Active Problem List   Diagnosis Date Noted  . Alcohol abuse   . Auditory hallucination   . 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 Adjustment disorder with depressed mood 09/07/2016  . Homelessness 09/03/2016  . Person feigning illness 09/03/2016  . 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 Cannabis use disorder, mild, abuse 08/29/2016  . Paranoid schizophrenia (HCC) 08/26/2016  . Pulmonary emboli (HCC) 07/13/2016  . 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 Acute left flank pain 01/02/2014  . Pulmonary embolism, bilateral (HCC) 01/02/2014    History reviewed. No pertinent surgical history.     Home Medications    Prior to Admission medications   Medication Sig Start Date End Date Taking? Authorizing Provider  benztropine (COGENTIN) 0.5 MG tablet Take 1 tablet (0.5 mg total) by mouth 2 (two) times daily. 08/30/16   Adonis BrookSheila Agustin, NP  cyclobenzaprine (FLEXERIL) 10 MG tablet Take 1 tablet (10 mg total) by mouth 2 (two) times daily as needed for muscle spasms. 09/14/16   Gilda Creasehristopher J Pollina, MD  pravastatin (PRAVACHOL) 20 MG tablet Take 20 mg by mouth at bedtime.    Historical Provider, MD  risperiDONE (RISPERDAL) 1 MG tablet Take 1 tablet (1 mg total) by mouth every  morning. 08/31/16   Adonis BrookSheila Agustin, NP  risperiDONE (RISPERDAL) 3 MG tablet Take 1 tablet (3 mg total) by mouth at bedtime. 08/30/16   Adonis BrookSheila Agustin, NP  rivaroxaban (XARELTO) 20 MG TABS tablet Take 1 tablet (20 mg total) by mouth daily with supper. 08/30/16   Adonis BrookSheila Agustin, NP  traZODone (DESYREL) 50 MG tablet Take 50-100 mg by mouth at bedtime as needed for sleep.    Historical Provider, MD    Family History History reviewed. No pertinent family history.  Social History Social History  Substance Use Topics  . Smoking status: Current Every Day Smoker    Packs/day: 0.25    Years: 4.00    Types: Cigarettes  . Smokeless tobacco: Never Used  . Alcohol use 1.2 oz/week    2 Cans of beer per week     Comment: Daily. Smells ETOH.      Allergies   Haldol [haloperidol]   Review of Systems Review of Systems Negative except as stated in HPI and PMH  Physical Exam Updated Vital Signs BP 115/85 (BP Location: Left Arm)   Pulse 96   Temp 97.4 F (36.3 C) (Oral)   Resp 19   SpO2 97%   Physical Exam  Constitutional: He is oriented to person, place, and time. He appears well-developed and well-nourished.  HENT:  Head: Normocephalic.  Eyes: EOM are normal.  Neck: Normal range of motion. Neck supple.  Pulmonary/Chest: Effort normal.  Musculoskeletal:  Normal range of motion.       Right shoulder: He exhibits normal range of motion, no tenderness, no swelling, no crepitus, no deformity, normal pulse and normal strength.       Right upper arm: He exhibits tenderness. He exhibits no swelling, no deformity and no laceration.       Arms: Radial pulse 2+, adequate circulation, equal grips.  Neurological: He is alert and oriented to person, place, and time.  Skin: Skin is warm and dry.  Nursing note and vitals reviewed.    ED Treatments / Results  Labs (all labs ordered are listed, but only abnormal results are displayed) Labs Reviewed - No data to display  Radiology Dg Shoulder  Right  Result Date: 09/14/2016 CLINICAL DATA:  Acute onset of right shoulder pain after running into concrete. Initial encounter. EXAM: RIGHT SHOULDER - 2+ VIEW COMPARISON:  None. FINDINGS: There is no evidence of fracture or dislocation. The right humeral head is seated within the glenoid fossa. The acromioclavicular joint is unremarkable in appearance. No significant soft tissue abnormalities are seen. The visualized portions of the right lung are clear. IMPRESSION: No evidence of fracture or dislocation. Electronically Signed   By: Roanna Raider M.D.   On: 09/14/2016 00:51   Dg Humerus Right  Result Date: 09/15/2016 CLINICAL DATA:  30 year old male with right arm trauma and pain. EXAM: RIGHT HUMERUS - 2+ VIEW COMPARISON:  Right shoulder radiograph dated 09/14/2016 FINDINGS: There is no evidence of fracture or other focal bone lesions. Soft tissues are unremarkable. IMPRESSION: Negative. Electronically Signed   By: Elgie Collard M.D.   On: 09/15/2016 00:38    Procedures Procedures (including critical care time)  Medications Ordered in ED Medications  acetaminophen (TYLENOL) tablet 650 mg (not administered)     Initial Impression / Assessment and Plan / ED Course  I have reviewed the triage vital signs and the nursing notes.  Pertinent imaging results that were available during my care of the patient were reviewed by me and considered in my medical decision making (see chart for details).  Clinical Course    Final Clinical Impressions(s) / ED Diagnoses  30 y.o. male with right upper arm pain s/p injury yesterday stable for d/c without focal neuro deficits and normal x-rays. Will treat for contusion and he will f/u with his PCP.   Final diagnoses:  Contusion of right upper arm, initial encounter    New Prescriptions New Prescriptions   No medications on file     [facility], NP 09/15/16 0111    Gwyneth Sprout, MD 09/15/16 2142

## 2016-09-22 DIAGNOSIS — S40011S Contusion of right shoulder, sequela: Secondary | ICD-10-CM | POA: Insufficient documentation

## 2016-09-22 DIAGNOSIS — F17210 Nicotine dependence, cigarettes, uncomplicated: Secondary | ICD-10-CM | POA: Diagnosis not present

## 2016-09-22 DIAGNOSIS — S4991XS Unspecified injury of right shoulder and upper arm, sequela: Secondary | ICD-10-CM | POA: Diagnosis present

## 2016-09-22 DIAGNOSIS — Z79899 Other long term (current) drug therapy: Secondary | ICD-10-CM | POA: Diagnosis not present

## 2016-09-22 DIAGNOSIS — W2201XS Walked into wall, sequela: Secondary | ICD-10-CM | POA: Diagnosis not present

## 2016-09-22 DIAGNOSIS — Z7901 Long term (current) use of anticoagulants: Secondary | ICD-10-CM | POA: Diagnosis not present

## 2016-09-23 ENCOUNTER — Encounter (HOSPITAL_COMMUNITY): Payer: Self-pay | Admitting: *Deleted

## 2016-09-23 ENCOUNTER — Emergency Department (HOSPITAL_COMMUNITY)
Admission: EM | Admit: 2016-09-23 | Discharge: 2016-09-23 | Disposition: A | Discharge status: Home / Self Care | Payer: Medicaid Other | Attending: Emergency Medicine | Admitting: Emergency Medicine

## 2016-09-23 DIAGNOSIS — S40011S Contusion of right shoulder, sequela: Secondary | ICD-10-CM

## 2016-09-23 LAB — CBC
HEMATOCRIT: 38.4 % — AB (ref 39.0–52.0)
HEMOGLOBIN: 12.4 g/dL — AB (ref 13.0–17.0)
MCH: 27.1 pg (ref 26.0–34.0)
MCHC: 32.3 g/dL (ref 30.0–36.0)
MCV: 83.8 fL (ref 78.0–100.0)
Platelets: 315 10*3/uL (ref 150–400)
RBC: 4.58 MIL/uL (ref 4.22–5.81)
RDW: 13.6 % (ref 11.5–15.5)
WBC: 4.7 10*3/uL (ref 4.0–10.5)

## 2016-09-23 LAB — COMPREHENSIVE METABOLIC PANEL
ALT: 43 U/L (ref 17–63)
AST: 27 U/L (ref 15–41)
Albumin: 4.3 g/dL (ref 3.5–5.0)
Alkaline Phosphatase: 49 U/L (ref 38–126)
Anion gap: 9 (ref 5–15)
BUN: 7 mg/dL (ref 6–20)
CO2: 24 mmol/L (ref 22–32)
Calcium: 8.9 mg/dL (ref 8.9–10.3)
Chloride: 105 mmol/L (ref 101–111)
Creatinine, Ser: 0.82 mg/dL (ref 0.61–1.24)
GFR calc Af Amer: >60 mL/min (ref >60)
GFR calc non Af Amer: >60 mL/min (ref >60)
Glucose, Bld: 110 mg/dL — ABNORMAL HIGH (ref 65–99)
Potassium: 3.2 mmol/L — ABNORMAL LOW (ref 3.5–5.1)
Sodium: 138 mmol/L (ref 135–145)
Total Bilirubin: 0.4 mg/dL (ref 0.3–1.2)
Total Protein: 7.9 g/dL (ref 6.5–8.1)

## 2016-09-23 LAB — RAPID URINE DRUG SCREEN, HOSP PERFORMED
Amphetamines: NOT DETECTED
BARBITURATES: NOT DETECTED
Benzodiazepines: NOT DETECTED
Cocaine: NOT DETECTED
Opiates: NOT DETECTED
TETRAHYDROCANNABINOL: NOT DETECTED

## 2016-09-23 LAB — ACETAMINOPHEN LEVEL: Acetaminophen (Tylenol), Serum: <10 ug/mL — ABNORMAL LOW (ref 10–30)

## 2016-09-23 LAB — SALICYLATE LEVEL: Salicylate Lvl: <7 mg/dL (ref 2.8–30.0)

## 2016-09-23 LAB — ETHANOL: Alcohol, Ethyl (B): 87 mg/dL — ABNORMAL HIGH (ref <5)

## 2016-09-23 MED ORDER — IBUPROFEN 800 MG PO TABS
800.0000 mg | ORAL_TABLET | Freq: Once | ORAL | Status: AC
  Administered 2016-09-23: 800 mg via ORAL
  Filled 2016-09-23: qty 1

## 2016-09-23 MED ORDER — IBUPROFEN 600 MG PO TABS: 600.0000 mg | ORAL_TABLET | Freq: Four times a day (QID) | ORAL | 0 refills | Status: DC | PRN

## 2016-09-23 NOTE — ED Notes (Signed)
PA at bedside.

## 2016-09-23 NOTE — ED Provider Notes (Signed)
[facility]-EMERGENCY DEPT Provider Note   CSN: [id number] Arrival date & time: 09/22/16  2356     History   Chief Complaint Chief Complaint  Patient presents with  . Medical Clearance  . Arm Pain    HPI Cole Barrett is a 30 y.o. male.  HPI   Pt has a PMH of homelessness, high cholesterol, mental disorder, Paranoid behavior, PE, and schizophrenia, ETOH abuse. Patient here with complaints of auditory hallucinations that are telling him that they wanted to kill him. The patient tells the staff that he wants to see the "doctor in the back and not the one at [facility] because they don't get along". Pt is calm and relaxed at this time. He also wants to get evaluated for an injury to his right arm three weeks ago.  Denies using ETOH or any other drugs. Denies HI/SI.  The patient towards the end of the interview says that he would like to only be seen for his shoulder and that if I cannot guarantee he won't have to talk to the [facility] doctor than he will go over to a different facility tomorrow. He has been seen for a his shoulder a few times before and has had normal xrays.   Past Medical History:  Diagnosis Date  . Homelessness   . Hypercholesterolemia   . Mental disorder   . Paranoid behavior (HCC)   . PE (pulmonary embolism)   . Schizophrenia [facility])     Patient Active Problem List   Diagnosis Date Noted  . Alcohol abuse   . Auditory hallucination   . 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 Adjustment disorder with depressed mood 09/07/2016  . Homelessness 09/03/2016  . Person feigning illness 09/03/2016  . 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 Cannabis use disorder, mild, abuse 08/29/2016  . Paranoid schizophrenia (HCC) 08/26/2016  . Pulmonary emboli (HCC) 07/13/2016  . 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 Acute left flank pain 01/02/2014  . Pulmonary embolism, bilateral (HCC) 01/02/2014    History reviewed. No pertinent surgical  history.     Home Medications    Prior to Admission medications   Medication Sig Start Date End Date Taking? Authorizing Provider  benztropine (COGENTIN) 0.5 MG tablet Take 1 tablet (0.5 mg total) by mouth 2 (two) times daily. 08/30/16  Yes Adonis Brook, NP  cyclobenzaprine (FLEXERIL) 10 MG tablet Take 1 tablet (10 mg total) by mouth 2 (two) times daily as needed for muscle spasms. 09/14/16  Yes Gilda Crease, MD  pravastatin (PRAVACHOL) 20 MG tablet Take 20 mg by mouth at bedtime.   Yes Historical Provider, MD  risperiDONE (RISPERDAL) 1 MG tablet Take 1 tablet (1 mg total) by mouth every morning. 08/31/16  Yes Adonis Brook, NP  risperiDONE (RISPERDAL) 3 MG tablet Take 1 tablet (3 mg total) by mouth at bedtime. 08/30/16  Yes Adonis Brook, NP  rivaroxaban (XARELTO) 20 MG TABS tablet Take 1 tablet (20 mg total) by mouth daily with supper. 08/30/16  Yes Adonis Brook, NP  traZODone (DESYREL) 50 MG tablet Take 50-100 mg by mouth at bedtime as needed for sleep.   Yes Historical Provider, MD  ibuprofen (ADVIL,MOTRIN) 600 MG tablet Take 1 tablet (600 mg total) by mouth every 6 (six) hours as needed. 09/23/16   Marlon Pel, PA-C    Family History No family history on file.  Social History Social History  Substance Use Topics  . Smoking status: Current Every Day Smoker    Packs/day: 0.25  Years: 4.00    Types: Cigarettes  . Smokeless tobacco: Never Used  . Alcohol use 1.2 oz/week    2 Cans of beer per week     Comment: Daily. Smells ETOH.      Allergies   Haldol [haloperidol]   Review of Systems Review of Systems Review of Systems All other systems negative except as documented in the HPI. All pertinent positives and negatives as reviewed in the HPI.   Physical Exam Updated Vital Signs BP 119/91 (BP Location: Left Arm)   Pulse 88   Temp 98 F (36.7 C) (Oral)   Resp 18   Ht 6' (1.829 m)   Wt 102.1 kg   SpO2 99%   BMI 30.52 kg/m   Physical Exam   Constitutional: He is oriented to person, place, and time. He appears well-developed and well-nourished.  HENT:  Head: Normocephalic and atraumatic.  Eyes: EOM are normal. Pupils are equal, round, and reactive to light.  Neck: Normal range of motion.  Cardiovascular: Normal rate and regular rhythm.   Pulmonary/Chest: Effort normal and breath sounds normal.  Musculoskeletal: Normal range of motion.       Right shoulder: He exhibits tenderness, bony tenderness and pain. He exhibits normal range of motion, no swelling, no effusion, no crepitus, no deformity, no laceration, no spasm, normal pulse and normal strength.  Neurological: He is alert and oriented to person, place, and time.  Skin: Skin is warm and dry.     ED Treatments / Results  Labs (all labs ordered are listed, but only abnormal results are displayed) Labs Reviewed  COMPREHENSIVE METABOLIC PANEL - Abnormal; Notable for the following:       Result Value   Potassium 3.2 (*)    Glucose, Bld 110 (*)    All other components within normal limits  ETHANOL - Abnormal; Notable for the following:    Alcohol, Ethyl (B) 87 (*)    All other components within normal limits  ACETAMINOPHEN LEVEL - Abnormal; Notable for the following:    Acetaminophen (Tylenol), Serum <10 (*)    All other components within normal limits  CBC - Abnormal; Notable for the following:    Hemoglobin 12.4 (*)    HCT 38.4 (*)    All other components within normal limits  SALICYLATE LEVEL  RAPID URINE DRUG SCREEN, HOSP PERFORMED    EKG  EKG Interpretation None       Radiology No results found.  Procedures Procedures (including critical care time)  Medications Ordered in ED Medications  ibuprofen (ADVIL,MOTRIN) tablet 800 mg (not administered)     Initial Impression / Assessment and Plan / ED Course  I have reviewed the triage vital signs and the nursing notes.  Pertinent labs & imaging results that were available during my care of the  patient were reviewed by me and considered in my medical decision making (see chart for details).  Clinical Course    CLINICAL DATA:  Acute onset of right shoulder pain after running into concrete. Initial encounter.  EXAM: RIGHT SHOULDER - 2+ VIEW  COMPARISON:  None.  FINDINGS: There is no evidence of fracture or dislocation. The right humeral head is seated within the glenoid fossa. The acromioclavicular joint is unremarkable in appearance. No significant soft tissue abnormalities are seen. The visualized portions of the right lung are clear.  IMPRESSION: No evidence of fracture or dislocation.   Electronically Signed   By: Roanna RaiderJeffery  Chang M.D.   On: 09/14/2016 00:51  --- not  an xray from todays visit.  Patients shoulder shows no rash or skin wound. He is tender to the entire joint but has FROM. Will refer to Ortho for further eval. He has decided he does not want psychiatric care at todays visit since he cannot choose who he gets to talk to. He hears voices telling them to kill but he says this is the typical voice/saying and that he does not feel he will act out on this. He has been taking his "head" medications. -- discussed return precautions. Given Motrin in ED and rx for the same for shoulder pain.  Final Clinical Impressions(s) / ED Diagnoses   Final diagnoses:  Contusion of right shoulder, sequela    New Prescriptions New Prescriptions   IBUPROFEN (ADVIL,MOTRIN) 600 MG TABLET    Take 1 tablet (600 mg total) by mouth every 6 (six) hours as needed.     Marlon Pel, PA-C 09/23/16 1610    Zadie Rhine, MD 09/23/16 [phone]

## 2016-09-23 NOTE — ED Triage Notes (Signed)
Pt reports running into a wall hitting his R arm x 3 weeks ago.  States he is also having auditory hallucinations telling him they want to kill him.  States "I wanna go back there and [place] [facility] [place] [facility] with that doctor there."  Pt is calm and cooperative at this time.

## 2016-09-24 ENCOUNTER — Encounter (HOSPITAL_COMMUNITY): Payer: Self-pay | Admitting: *Deleted

## 2016-09-24 ENCOUNTER — Emergency Department (HOSPITAL_COMMUNITY)
Admission: EM | Admit: 2016-09-24 | Discharge: 2016-09-25 | Disposition: A | Discharge status: Home / Self Care | Payer: Medicaid Other | Attending: Emergency Medicine | Admitting: Emergency Medicine

## 2016-09-24 ENCOUNTER — Ambulatory Visit (HOSPITAL_COMMUNITY)
Admission: RE | Admit: 2016-09-24 | Discharge: 2016-09-24 | Disposition: A | Discharge status: Home / Self Care | Payer: Medicaid Other | Attending: Psychiatry | Admitting: Psychiatry

## 2016-09-24 DIAGNOSIS — Z7901 Long term (current) use of anticoagulants: Secondary | ICD-10-CM | POA: Diagnosis not present

## 2016-09-24 DIAGNOSIS — F17210 Nicotine dependence, cigarettes, uncomplicated: Secondary | ICD-10-CM | POA: Insufficient documentation

## 2016-09-24 DIAGNOSIS — R443 Hallucinations, unspecified: Secondary | ICD-10-CM | POA: Diagnosis present

## 2016-09-24 DIAGNOSIS — R440 Auditory hallucinations: Secondary | ICD-10-CM | POA: Insufficient documentation

## 2016-09-24 LAB — CBC
HEMATOCRIT: 36.9 % — AB (ref 39.0–52.0)
Hemoglobin: 11.9 g/dL — ABNORMAL LOW (ref 13.0–17.0)
MCH: 27 pg (ref 26.0–34.0)
MCHC: 32.2 g/dL (ref 30.0–36.0)
MCV: 83.7 fL (ref 78.0–100.0)
Platelets: 324 10*3/uL (ref 150–400)
RBC: 4.41 MIL/uL (ref 4.22–5.81)
RDW: 13.7 % (ref 11.5–15.5)
WBC: 5.4 10*3/uL (ref 4.0–10.5)

## 2016-09-24 NOTE — ED Notes (Signed)
At this time patient denies SI/HI

## 2016-09-24 NOTE — BH Assessment (Signed)
Assessment Note  Cole HawkingChristopher Adam Barrett is an 30 y.o. male presenting [facility]. Pt says he wants medications because he is hearing voices telling him they are going to kill him. Voices have been on-going for several months; worsening in the past week.  Pt denies these voices are command in nature. Pt denies visual hallucinations. Pt denies depressive symptoms and has no stressors at this time.  He denies problems with sleep or appetite. No significant changes with weight gain and/or loss. He sleeps 8 hrs per day.  He denies current suicidal ideation or history of suicide attempts. He denies any history of intentional self-injurious behavior. He denies current homicidal ideation or history of violence. He was calm and cooperative throughout the assessment.  Pt reports he drank one 40-ounce beer yesterday. Pt denies issues with substance abuse admitting that he smokes THC occasionally.   Pt cannot identify any stressors other than his auditory hallucinations. Pt reports he does have a place to live but has no family or friends who are supportive. He states he has  followed up with [facility] and has 2 upcoming appts. 10/19/2016 and 11/01/2016. Per medical record, he was  [facility] where he was bullying other patients and demanding services. Approximately 2-3 months ago patient received INPT treatment [place].  Pt is dressed in street clothing, drowsy, oriented x4 with normal speech,  and normal motor behavior. Eye contact is appropriate. Pt's mood is euthymic and affect is congruent with mood. Thought process is coherent and relevant.   Diagnosis: Schizophrenia Past Medical History:  Past Medical History:  Diagnosis Date  . Homelessness   . Hypercholesterolemia   . Mental disorder   . Paranoid behavior (HCC)   . PE (pulmonary embolism)   . Schizophrenia (HCC)     No past surgical history on file.  Family History: No family history on  file.  Social History:  reports that he has been smoking Cigarettes.  He has a 1.00 pack-year smoking history. He has never used smokeless tobacco. He reports that he drinks about 1.2 oz of alcohol per week . He reports that he uses drugs, including Marijuana.  Additional Social History:  Alcohol / Drug Use Pain Medications: Pt denies abuse Prescriptions: pt denies abuse Over the Counter: pt denies abuse  Longest period of sobriety (when/how long): unknown  Substance #1 Name of Substance 1: ETOH 1 - Age of First Use: Teens 1 - Amount (size/oz): Two 40 oz beers 1 - Frequency: every other day 1 - Duration: Ongoing  1 - Last Use / Amount: 09/23/2016 Substance #2 Name of Substance 2: Marijuana 2 - Age of First Use: Unknown 2 - Amount (size/oz): Unknown 2 - Frequency: Every 2-3 months 2 - Duration: ongoing  2 - Last Use / Amount: "2 months ago"   CIWA:   COWS:    Allergies:  Allergies  Allergen Reactions  . Haldol [Haloperidol] Shortness Of Breath    Home Medications:  (Not in a hospital admission)  OB/GYN Status:  No LMP for male patient.  General Assessment Data [facility]: [facility] Assessment: In system Is this a Tele or Face-to-Face Assessment?: Face-to-Face Is this an Initial Assessment or a Re-assessment for this encounter?: Re-Assessment Marital status: Single Maiden name:  (n/a) Is patient pregnant?: No Pregnancy Status: No Living Arrangements: Alone Can pt return to current living arrangement?: No Admission Status: Voluntary Is patient capable of signing voluntary admission?: Yes Referral  Source: Self/Family/Friend Insurance type:  (Medicaid )  Medical Screening Exam [facility] Walk-in ONLY) Medical Exam completed: Yes  Crisis Care Plan Living Arrangements: Alone Legal Guardian: Other: (no legal guardian ) Name of Psychiatrist: None Name of Therapist: None  Education Status Is patient currently in school?: No Current Grade:  (n/a) Highest  grade of school patient has completed: 12th Name of school: N/A Contact person: N/A  Risk to self with the past 6 months Suicidal Ideation: No Has patient been a risk to self within the past 6 months prior to admission? : No Suicidal Intent: No Has patient had any suicidal intent within the past 6 months prior to admission? : No Is patient at risk for suicide?: No Suicidal Plan?: No Has patient had any suicidal plan within the past 6 months prior to admission? : No Access to Means: No What has been your use of drugs/alcohol within the last 12 months?:  (denies ) Previous Attempts/Gestures: No How many times?:  (0) Other Self Harm Risks:  (denies ) Triggers for Past Attempts: None known Intentional Self Injurious Behavior: None Family Suicide History: No Recent stressful life event(s): Other (Comment) Persecutory voices/beliefs?: No Depression: No Depression Symptoms:  (patient denies ) Substance abuse history and/or treatment for substance abuse?: Yes Suicide prevention information given to non-admitted patients: Not applicable  Risk to Others within the past 6 months Homicidal Ideation: No Does patient have any lifetime risk of violence toward others beyond the six months prior to admission? : No Thoughts of Harm to Others: No Comment - Thoughts of Harm to Others:  (n/a) Current Homicidal Intent: No Current Homicidal Plan: No Access to Homicidal Means: No Identified Victim:  (n/a) History of harm to others?: No Assessment of Violence: None Noted Violent Behavior Description:  (patient calm and cooperative) Does patient have access to weapons?: No Criminal Charges Pending?: Yes Describe Pending Criminal Charges:  (urinating in public  yesterday) Does patient have a court date: Yes Court Date:  (09/24/2016; sts he did not go to court date) Is patient on probation?: No     Mental Status Report Appearance/Hygiene: Disheveled Eye Contact: Fair Motor Activity: Freedom of  movement Speech: Logical/coherent, Slurred Level of Consciousness: Alert Mood: Sad Affect: Flat Anxiety Level: Minimal Thought Processes: Relevant Judgement: Unimpaired Orientation: Person, Place, Time, Situation, Appropriate for developmental age Obsessive Compulsive Thoughts/Behaviors: None  Cognitive Functioning Concentration: Decreased Memory: Recent Intact, Remote Intact IQ: Average Insight: Fair Impulse Control: Fair Appetite: Good Weight Loss:  (none reported) Weight Gain:  (none reported) Sleep: No Change Total Hours of Sleep:  (8 hrs of sleep per night ) Vegetative Symptoms: None  ADLScreening [facility] Assessment Services) Patient's cognitive ability adequate to safely complete daily activities?: Yes Patient able to express need for assistance with ADLs?: Yes Independently performs ADLs?: Yes (appropriate for developmental age)  Prior Inpatient Therapy Prior Inpatient Therapy: Yes Prior Therapy Dates: 2017 Prior Therapy Facilty/Provider(s): [facility] & facility in [facility] Reason for Treatment: psychosis  Prior Outpatient Therapy Prior Outpatient Therapy: No Prior Therapy Dates: na Prior Therapy Facilty/Provider(s): na Reason for Treatment: na Does patient have an ACCT team?: No Does patient have Intensive In-House Services?  : No Does patient have [facility] services? : No Does patient have P4[facility] services?: No  ADL Screening (condition at time of admission) Patient's cognitive ability adequate to safely complete daily activities?: Yes Is the patient deaf or have difficulty hearing?: No Does the patient have difficulty seeing, even when wearing glasses/contacts?: No Does the patient  have difficulty concentrating, remembering, or making decisions?: No Patient able to express need for assistance with ADLs?: Yes Does the patient have difficulty dressing or bathing?: No Independently performs ADLs?: Yes (appropriate for developmental age) Does the patient have  difficulty walking or climbing stairs?: No Weakness of Legs: None Weakness of Arms/Hands: None  Home Assistive Devices/Equipment Home Assistive Devices/Equipment: None    Abuse/Neglect Assessment (Assessment to be complete while patient is alone) Physical Abuse: Denies Verbal Abuse: Denies Sexual Abuse: Denies Exploitation of patient/patient's resources: Denies Self-Neglect: Denies Values / Beliefs Cultural Requests During Hospitalization: None Spiritual Requests During Hospitalization: None   Advance Directives (For Healthcare) Does patient have an advance directive?: No Would patient like information on creating an advanced directive?: No - patient declined information Nutrition Screen- [facility] Adult/[facility] Patient's home diet: Regular  Additional Information 1:1 In Past 12 Months?: No CIRT Risk: No Elopement Risk: No Does patient have medical clearance?: Yes     Disposition: Patient evaluated by Elta GuadeloupeLaurie Parks, NP and [facility].  Disposition Initial Assessment Completed for this Encounter: Yes Disposition of Patient: Other dispositions, Referred to, Outpatient treatment [name]([facility] )  On Site Evaluation by:   Reviewed with Physician:    Melynda RipplePerry, Calayah Guadarrama [facility] 09/24/2016 11:21 AM

## 2016-09-24 NOTE — H&P (Signed)
Behavioral Health Medical Screening Exam  Cole Barrett is an 30 y.o. male.  Total Time spent with patient: 15 minutes  Psychiatric Specialty Exam: Physical Exam  Constitutional: He is oriented to person, place, and time. He appears well-developed and well-nourished.  HENT:  Head: Normocephalic and atraumatic.  Eyes: Pupils are equal, round, and reactive to light.  Neck: Normal range of motion.  Cardiovascular: Normal rate, regular rhythm and normal heart sounds.   Respiratory: Effort normal and breath sounds normal.  GI: Soft. Bowel sounds are normal.  Musculoskeletal: Normal range of motion.  Neurological: He is alert and oriented to person, place, and time.  Skin: Skin is warm and dry.    Review of Systems  Psychiatric/Behavioral: Positive for depression, hallucinations (auditory) and substance abuse (etoh). Negative for memory loss and suicidal ideas. The patient is not nervous/anxious and does not have insomnia.     Blood pressure (!) 106/92, pulse 82, temperature 98.6 F (37 C), temperature source Oral, resp. rate 18, SpO2 100 %.There is no height or weight on file to calculate BMI.  General Appearance: Casual and Fairly Groomed  Eye Contact:  Good  Speech:  Clear and Coherent and Normal Rate  Volume:  Normal  Mood:  Depressed  Affect:  Congruent and Depressed  Thought Process:  Coherent, Goal Directed and Linear  Orientation:  Full (Time, Place, and Person)  Thought Content:  WDL and Logical  Suicidal Thoughts:  No  Homicidal Thoughts:  No  Memory:  Immediate;   Good Recent;   Good Remote;   Fair  Judgement:  Good  Insight:  Good  Psychomotor Activity:  Normal  Concentration: Concentration: Good and Attention Span: Good  Recall:  Good  Fund of Knowledge:Good  Language: Good  Akathisia:  No  Handed:  Right  AIMS (if indicated):     Assets:  Communication Skills Desire for Improvement Housing Resilience  Sleep:   good     Musculoskeletal: Strength & Muscle Tone: within normal limits Gait & Station: normal Patient leans: N/A  Blood pressure (!) 106/92, pulse 82, temperature 98.6 F (37 C), temperature source Oral, resp. rate 18, SpO2 100 %.  Recommendations:  Based on my evaluation the patient does not appear to have an emergency medical condition.  Laveda AbbeLaurie Britton Arben Packman, NP 09/24/2016, 12:19 PM

## 2016-09-24 NOTE — ED Triage Notes (Signed)
Patient presents stating he needs some medicine to get rid of the voices he is hearing (has been here for the same)

## 2016-09-25 LAB — COMPREHENSIVE METABOLIC PANEL
ALT: 52 U/L (ref 17–63)
AST: 34 U/L (ref 15–41)
Albumin: 4 g/dL (ref 3.5–5.0)
Alkaline Phosphatase: 49 U/L (ref 38–126)
Anion gap: 11 (ref 5–15)
BUN: 10 mg/dL (ref 6–20)
CO2: 23 mmol/L (ref 22–32)
Calcium: 8.9 mg/dL (ref 8.9–10.3)
Chloride: 101 mmol/L (ref 101–111)
Creatinine, Ser: 1.06 mg/dL (ref 0.61–1.24)
GFR calc Af Amer: >60 mL/min (ref >60)
GFR calc non Af Amer: >60 mL/min (ref >60)
Glucose, Bld: 93 mg/dL (ref 65–99)
Potassium: 3.7 mmol/L (ref 3.5–5.1)
Sodium: 135 mmol/L (ref 135–145)
Total Bilirubin: 0.6 mg/dL (ref 0.3–1.2)
Total Protein: 7.1 g/dL (ref 6.5–8.1)

## 2016-09-25 LAB — RAPID URINE DRUG SCREEN, HOSP PERFORMED
Amphetamines: NOT DETECTED
BARBITURATES: NOT DETECTED
BENZODIAZEPINES: NOT DETECTED
COCAINE: NOT DETECTED
OPIATES: NOT DETECTED
Tetrahydrocannabinol: POSITIVE — AB

## 2016-09-25 LAB — ETHANOL

## 2016-09-25 LAB — SALICYLATE LEVEL

## 2016-09-25 LAB — ACETAMINOPHEN LEVEL: Acetaminophen (Tylenol), Serum: <10 ug/mL — ABNORMAL LOW (ref 10–30)

## 2016-09-25 MED ORDER — PRAVASTATIN SODIUM 20 MG PO TABS: 20.0000 mg | ORAL_TABLET | Freq: Every day | ORAL | 0 refills | Status: DC

## 2016-09-25 MED ORDER — PRAVASTATIN SODIUM 40 MG PO TABS
20.0000 mg | ORAL_TABLET | Freq: Every day | ORAL | Status: DC
  Administered 2016-09-25: 20 mg via ORAL
  Filled 2016-09-25: qty 1

## 2016-09-25 NOTE — Discharge Instructions (Signed)
RESOURCE GUIDE ° °Chronic Pain Problems: °Contact  Chronic Pain Clinic  297-2271 °Patients need to be referred by their primary care doctor. ° °Insufficient Money for Medicine: °Contact [employer]:  call "211" or [facility] 271-5999. ° °No Primary Care Doctor: °Call [facility]  832-8000 - can help you locate a primary care doctor that  accepts your insurance, provides certain services, etc. °Physician Referral Service- 1-800-533-3463 ° °Agencies that provide inexpensive medical care: °[facility]  832-8035 °[facility]  832-7272 °[facility]  [phone] °Women's Clinic  832-4777 °Planned Parenthood  373-0678 °[name]  272-1050 ° °Medicaid-accepting [name] Providers: °[facility]- [address] ° [phone], Mon-Fri 9am-7pm, Sat 9am-1pm °[facility]- [address], [address] ° [phone] °[facility]- [address] ° [phone] °[facility]- [address] [place] [address] ° [phone] °[facility]- [address], [phone] ° Only accepts [place] Access Medicaid patients after they have their name  applied to their card ° °Self Pay (no insurance) in [name]: °Sickle Cell Patients: Dr Eric Dean, [name] ° [address], [phone] °[facility]- [address] ° [phone] °      -     [facility]- [address] °      -     [facility]- see information above (Speak to Pam H if you do not have insurance) °      -  [facility]- [address], [phone] °      -  [facility] [place]- [address],  [phone] °      -  [facility]- [address] [place] [address], [phone] °      -  Dr Osei-Bonsu-  [address], [place], [phone] °      -  [facility]- [address], [phone] °      -   [facility]- [address] [place] [address], [phone], also [address], [phone] °      -    [facility]- [address], [phone], 1st & 3rd Saturday   every month, 10am-1pm ° °1) Find a Doctor and Pay Out of Pocket °Although you won't have to find out who is covered by your insurance plan, it is a good idea to ask around and get recommendations. You will then need to call the office and see if the doctor you have chosen will accept you as a new patient and what types of options they offer for patients who are self-pay. Some doctors offer discounts or will set up payment plans for their patients who do not have insurance, but you will need to ask so you aren't surprised when you get to your appointment. ° °2) Contact Your Local Health Department °Not all health departments have doctors that can see patients for sick visits, but many do, so it is worth a call to see if yours does. If you don't know where your local health department is, you can check in your phone book. The CDC also has a tool to help you locate your state's health department, and many state websites also have listings of all of their local health departments. ° °3) Find a Walk-in Clinic °If   your illness is not likely to be very severe or complicated, you may want to try a walk in clinic. These are popping up all over the country in pharmacies, drugstores, and shopping centers. They're usually staffed by nurse practitioners or physician assistants that have been trained to treat common illnesses and complaints. They're usually fairly quick and inexpensive. However, if you have serious medical issues or chronic medical problems, these are probably not your best option ° °STD Testing °[name] [facility] [place], STD Clinic, [address], [place], phone [phone] or 1-877-539-9860.  Monday - Friday, call for an appointment. °[name] [facility] [place], STD  Clinic, [address], [place], phone [phone] or 1-877-539-9860.  Monday - Friday, call for an appointment. ° °Abuse/Neglect: °[name] Child Abuse Hotline (336) 641-3795 °[name] Child Abuse Hotline 800-378-5315 (After Hours) ° °Emergency Shelter:  [facility] (336) 271-5985 ° °Maternity Homes: °[facility] (336) 275-9566 °[facility] (704) 372-4663 ° °MRSA Hotline #:   832-7006 ° °[place] Resources ° °[facility] [place]  [employer] [place] [facility] °[address]                 [address]         [address]  °[place]                                               [place]                              [place] °Phone:  349-3220                                  Phone:  342-7768                   Phone:  342-8140 ° °[place] [facility], 342-8316 °[place] [facility] - [facility]- 1-888-581-9988 °      -     [facility] in , [address],                                  336-349-4454, Insurance ° °[place] Child Abuse Hotline °(336) 342-1394 or (336) 342-3537 (After Hours) ° ° °Behavioral Health Services ° °Substance Abuse Resources: °[facility]  336-882-2125 °[facility] 336-784-9470 °[facility] 336-285-9073 °[facility] 336-845-3988 °Residential & Outpatient Substance Abuse Program  800-659-3381 ° °Psychological Services: °[facility]  832-9600 °[facility]  378-7881 °[name], [address], [place], ACCESS LINE: 1-800-853-5163 or 336-641-4981, [email or web address].htm ° °Dental Assistance ° °If unable to pay or uninsured, contact:  [facility] or [name] [facility] to become qualified for the adult dental clinic. ° °Patients with Medicaid: [facility] °[address], 632-0744 °[address], 510-2600 ° °If unable to pay, or uninsured, contact [facility] (271-5999) or [name] [facility] (641-3152 in , 842-7733 in [place]) to become qualified for the adult dental clinic ° °Other Low-Cost   Community Dental Services: °[facility]- [address], [place], [place], [address], 723-1848, Ext. 123, 2nd and 4th Thursday of the month at 6:30am.  10 clients each day by appointment, can sometimes see walk-in patients if someone does not show for an appointment. °[facility]- [address], [place], [place], [address], 723-7904 °[facility]- [address], [place], [place], [address], 631-2330 °[place] [facility]- 342-8273 °[facility]- 703-3100 °[facility]- 570-6415 ° °Please make every effort to establish with a primary care physician for routine medical care ° °Adult Health Services  °The [name] [facility] provides a wide range of adult health services. Some of these services are designed to address the healthcare needs of all [name] residents and all services are designed to meet the needs of uninsured/underinsured low income residents. Some services are available to any resident of [place], call [phone] for details. °] °[facility], a new medical clinic for adults, is now open. For more information about the Center and its services please call [phone]. °For information on our Refugee Health services, click here. ° °For more information on any of the following Department of Public Health programs, including hours of service, click on the highlighted link. ° °SERVICES FOR WOMEN (Adults and Teens) °Family Planning Services provide a full range of birth control options plus education and counseling. New patient visit and annual return visits include a complete  examination, pap test as indicated, and other laboratory as indicated. Included is our Regional Vasectomy Program for men. ° °Maternity Care is provided through pregnancy, including a six week post partum exam. Women who meet eligibility criteria for the Medicaid for Pregnant Women program, receive care free. Other women are charged on a sliding scale according to income. °Note: [facility] provides services to pregnant women who have a Medicaid card. Call [phone] for an appointment in [place] or [phone] for an appointment in [place]. ° °Primary Care for Medicaid [place] Access Women is available through the [name] [facility]. As primary care provider for the [place] Medicaid [place] Access Medicaid Managed Care program, women may designate the Women’s Health clinic as their primary care provider. ° °PLEASE CALL 641-3245 FOR AN APPOINTMENT FOR THE ABOVE SERVICES IN EITHER [place] OR [place]. Information available in English and Spanish.  ° °Childbirth Education Classes are open to the public and offered to help families prepare for the best possible childbirth experience as well as to promote lifelong health and wellness. Classes are offered throughout the year and meet on the same night once a week for five weeks. Medicaid covers the cost of the classes for the mother-to-be and her partner. For participants without Medicaid, the cost of the class series is $45.00 for the mother-to-be and her partner. Class size is limited and registration is required. For more information or to register call 336-641-4718. Baby items donated by [employer] and the [employer] [place] are given away during each class series. ° °SERVICES FOR WOMEN AND MEN °Sexually Transmitted Infection appointments, including HIV testing, are available daily (weekdays, except holidays). Call early as same-day appointments are limited. For an appointment in either [place] or [place],  call [phone]. Services are confidential and free of charge. ° °Skin Testing for Tuberculosis Please call [phone]. °Adult Immunizations are available, usually for a fee. Please call [phone] for details. ° °PLEASE CALL [phone] FOR AN APPOINTMENT FOR THE ABOVE   SERVICES IN EITHER [place] OR [place].  ° °[facility] provides up to the minute recommended vaccines for your travel destination. We also provide essential health and political information to help insure a safe and pleasurable travel experience. This program is self-sustaining, however, fees are very competitive. We are a CERTIFIED YELLOW FEVER IMMUNIZATION approved clinic site. °PLEASE CALL [phone] FOR AN APPOINTMENT IN EITHER [place] OR [place].  ° °If you have questions about the services listed above, we want to answer them! Email us at: [email or web address] °Home Visiting Services for elderly and the disabled are available to residents of [name] who are in need of care that compares to the care offered by a nursing home, have needs that can be met by the program, and have [place] Medicaid. Other short term services are available to residents 18 years and older who are unable to meet requirements for eligibility to receive services from a certified home health agency, spend the majority of time at home, and need care for six months or less. ° °PLEASE CALL [phone] OR [phone] FOR MORE INFORMATION. °Medication Assistance Program serves as a link between pharmaceutical companies and patients to provide low cost or free prescription medications. This servce is available for residents who meet certain income restrictions and have no insurance coverage. ° °PLEASE CALL 641-8030 ([place]) OR 641-7620 ([place]) FOR MORE INFORMATION.  °Updated Feb. 21, 2013 ° ° °

## 2016-09-25 NOTE — ED Provider Notes (Signed)
[facility]-EMERGENCY DEPT Provider Note   CSN: [id number] Arrival date & time: 09/24/16  2325     History   Chief Complaint Chief Complaint  Patient presents with  . Hearing voices    HPI Cole Barrett is a 30 y.o. male.  HPI   Pt with PMH of homelessness, hypercholesterolemia, mental disorder, paranoid behavior, PE, schizophrenia, comes to the ER with complaints of needing medications refill and wanting help to make voices go away. I saw him on 09/23/2016 for the same things and [facility], [facility]itioner [facility] and they decided that they did not need to adjust his medications and want him to follow-up with [facility]. Today he is not SI/HI, he has not taken his nighttime doses of medications yet and needs a refill of his Pravastatin. He says he plans to go back to [place] to his usually provider to see if he can get his meds adjusted He says he no longer takes Trazodone and does not remember needing it. He denies SI/HI  Past Medical History:  Diagnosis Date  . Homelessness   . Hypercholesterolemia   . Mental disorder   . Paranoid behavior (HCC)   . PE (pulmonary embolism)   . Schizophrenia [facility](HCC)     Patient Active Problem List   Diagnosis Date Noted  . Alcohol abuse   . Auditory hallucination   . 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 Adjustment disorder with depressed mood 09/07/2016  . Homelessness 09/03/2016  . Person feigning illness 09/03/2016  . 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 Cannabis use disorder, mild, abuse 08/29/2016  . Paranoid schizophrenia (HCC) 08/26/2016  . Pulmonary emboli (HCC) 07/13/2016  . 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 Acute left flank pain 01/02/2014  . Pulmonary embolism, bilateral (HCC) 01/02/2014    History reviewed. No pertinent surgical history.     Home Medications    Prior to Admission medications   Medication Sig Start Date End Date Taking?  Authorizing Provider  benztropine (COGENTIN) 0.5 MG tablet Take 1 tablet (0.5 mg total) by mouth 2 (two) times daily. 08/30/16   Adonis BrookSheila Agustin, NP  cyclobenzaprine (FLEXERIL) 10 MG tablet Take 1 tablet (10 mg total) by mouth 2 (two) times daily as needed for muscle spasms. 09/14/16   Gilda Creasehristopher J Pollina, MD  ibuprofen (ADVIL,MOTRIN) 600 MG tablet Take 1 tablet (600 mg total) by mouth every 6 (six) hours as needed. 09/23/16   Folashade Gamboa Neva SeatGreene, PA-C  pravastatin (PRAVACHOL) 20 MG tablet Take 20 mg by mouth at bedtime.    Historical Provider, MD  pravastatin (PRAVACHOL) 20 MG tablet Take 1 tablet (20 mg total) by mouth daily. 09/25/16   Demonie Kassa Neva SeatGreene, PA-C  risperiDONE (RISPERDAL) 1 MG tablet Take 1 tablet (1 mg total) by mouth every morning. 08/31/16   Adonis BrookSheila Agustin, NP  risperiDONE (RISPERDAL) 3 MG tablet Take 1 tablet (3 mg total) by mouth at bedtime. 08/30/16   Adonis BrookSheila Agustin, NP  rivaroxaban (XARELTO) 20 MG TABS tablet Take 1 tablet (20 mg total) by mouth daily with supper. 08/30/16   Adonis BrookSheila Agustin, NP  traZODone (DESYREL) 50 MG tablet Take 50-100 mg by mouth at bedtime as needed for sleep.    Historical Provider, MD    Family History No family history on file.  Social History Social History  Substance Use Topics  . Smoking status: Current Every Day Smoker    Packs/day: 0.25    Years: 4.00  Types: Cigarettes  . Smokeless tobacco: Never Used  . Alcohol use 1.2 oz/week    2 Cans of beer per week     Comment: Daily. Smells ETOH.      Allergies   Haldol [haloperidol]   Review of Systems Review of Systems Review of Systems All other systems negative except as documented in the HPI. All pertinent positives and negatives as reviewed in the HPI.   Physical Exam Updated Vital Signs BP 132/87 (BP Location: Left Arm)   Pulse 86   Temp 97.9 F (36.6 C) (Oral)   Resp 18   SpO2 100%   Physical Exam  Constitutional: He appears well-developed and well-nourished. No distress.    HENT:  Head: Normocephalic and atraumatic.  Eyes: Pupils are equal, round, and reactive to light.  Neck: Normal range of motion. Neck supple.  Cardiovascular: Normal rate and regular rhythm.   Pulmonary/Chest: Effort normal.  Abdominal: Soft.  Neurological: He is alert.  Skin: Skin is warm and dry.  Psychiatric: He has a normal mood and affect. His speech is normal and behavior is normal. He is actively hallucinating (not actively hallucinating). He expresses no homicidal and no suicidal ideation. He expresses no suicidal plans and no homicidal plans.  Nursing note and vitals reviewed.    ED Treatments / Results  Labs (all labs ordered are listed, but only abnormal results are displayed) Labs Reviewed  ACETAMINOPHEN LEVEL - Abnormal; Notable for the following:       Result Value   Acetaminophen (Tylenol), Serum <10 (*)    All other components within normal limits  CBC - Abnormal; Notable for the following:    Hemoglobin 11.9 (*)    HCT 36.9 (*)    All other components within normal limits  RAPID URINE DRUG SCREEN, HOSP PERFORMED - Abnormal; Notable for the following:    Tetrahydrocannabinol POSITIVE (*)    All other components within normal limits  COMPREHENSIVE METABOLIC PANEL  ETHANOL  SALICYLATE LEVEL    EKG  EKG Interpretation None       Radiology No results found.  Procedures Procedures (including critical care time)  Medications Ordered in ED Medications  pravastatin (PRAVACHOL) tablet 20 mg (not administered)     Initial Impression / Assessment and Plan / ED Course  I have reviewed the triage vital signs and the nursing notes.  Pertinent labs & imaging results that were available during my care of the patient were reviewed by me and considered in my medical decision making (see chart for details).  Clinical Course    Patient is not suicidal or homicidal. He was evaluated by psych yesterday for his hallucinations and they determined that no  medication changes would be made. The voices are telling him things but he does not specify at this time, reviewing his TTS note they are telling him to kill people. He has his medications with him but is out of his Statin, he plans to go to [place] to get help with his medications. At this time he does not have any findings to warrant inpatient placement from the ED. Meds refilled for him.  Final Clinical Impressions(s) / ED Diagnoses   Final diagnoses:  Hallucinations    New Prescriptions New Prescriptions   PRAVASTATIN (PRAVACHOL) 20 MG TABLET    Take 1 tablet (20 mg total) by mouth daily.     Marlon Peliffany Maryetta Shafer, PA-C 09/25/16 [id number]    Shon Batonourtney F Horton, MD 09/25/16 [phone]

## 2016-10-11 ENCOUNTER — Encounter (HOSPITAL_COMMUNITY): Payer: Self-pay | Admitting: Emergency Medicine

## 2016-10-11 ENCOUNTER — Emergency Department (HOSPITAL_COMMUNITY)
Admission: EM | Admit: 2016-10-11 | Discharge: 2016-10-12 | Disposition: A | Discharge status: Home / Self Care | Payer: Medicaid Other | Attending: Emergency Medicine | Admitting: Emergency Medicine

## 2016-10-11 DIAGNOSIS — Z7901 Long term (current) use of anticoagulants: Secondary | ICD-10-CM | POA: Insufficient documentation

## 2016-10-11 DIAGNOSIS — F17210 Nicotine dependence, cigarettes, uncomplicated: Secondary | ICD-10-CM | POA: Diagnosis not present

## 2016-10-11 DIAGNOSIS — Z79899 Other long term (current) drug therapy: Secondary | ICD-10-CM | POA: Diagnosis not present

## 2016-10-11 DIAGNOSIS — M79601 Pain in right arm: Secondary | ICD-10-CM | POA: Insufficient documentation

## 2016-10-11 NOTE — ED Triage Notes (Signed)
Pt is c/o right arm pain   Pt states the pain came back today around 4 pm  Pt states he thinks he slept on it wrong

## 2016-10-12 NOTE — ED Provider Notes (Signed)
[facility] Provider Note   CSN: [id number] Arrival date & time: 10/11/16  2253  By signing my name below, I, Cole Barrett, attest that this documentation has been prepared under the direction and in the presence of Cole Berrie, PA-C Electronically Signed: Soijett Barrett, ED Scribe. 10/12/16. 12:11 AM.  History   Chief Complaint Chief Complaint  Patient presents with  . Arm Pain    HPI Cole Barrett is a 30 y.o. male with a PMHx of mental disorder, homelessness, who presents to the Emergency Department complaining of 6/10 right arm pain onset 4 AM this morning. Pt reports that he "slept on it wrong." Pt denies recent falls or injuries. Endorses previous injury to this arm. He notes that he has not tried any medications for the relief of his symptoms. He denies color change, wound, swelling, neuro deficits, or any other complaints. Pt states, "I just need a pain pill."  Pt reports that he is on Xarelto for a PE that he had earlier this year. He voices compliance with his Xarelto.  The history is provided by the patient. No language interpreter was used.    Past Medical History:  Diagnosis Date  . Homelessness   . Hypercholesterolemia   . Mental disorder   . Paranoid behavior (HCC)   . PE (pulmonary embolism)   . Schizophrenia [facility](HCC)     Patient Active Problem List   Diagnosis Date Noted  . Alcohol abuse   . Auditory hallucination   . 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 Adjustment disorder with depressed mood 09/07/2016  . Homelessness 09/03/2016  . Person feigning illness 09/03/2016  . 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 Cannabis use disorder, mild, abuse 08/29/2016  . Paranoid schizophrenia (HCC) 08/26/2016  . Pulmonary emboli (HCC) 07/13/2016  . 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 Acute left flank pain 01/02/2014  . Pulmonary embolism, bilateral (HCC) 01/02/2014    History reviewed. No pertinent surgical  history.     Home Medications    Prior to Admission medications   Medication Sig Start Date End Date Taking? Authorizing Provider  benztropine (COGENTIN) 0.5 MG tablet Take 1 tablet (0.5 mg total) by mouth 2 (two) times daily. 08/30/16   Adonis BrookSheila Agustin, NP  cyclobenzaprine (FLEXERIL) 10 MG tablet Take 1 tablet (10 mg total) by mouth 2 (two) times daily as needed for muscle spasms. 09/14/16   Gilda Creasehristopher J Pollina, MD  ibuprofen (ADVIL,MOTRIN) 600 MG tablet Take 1 tablet (600 mg total) by mouth every 6 (six) hours as needed. 09/23/16   Tiffany Neva SeatGreene, PA-C  pravastatin (PRAVACHOL) 20 MG tablet Take 20 mg by mouth at bedtime.    Historical Provider, MD  pravastatin (PRAVACHOL) 20 MG tablet Take 1 tablet (20 mg total) by mouth daily. 09/25/16   Tiffany Neva SeatGreene, PA-C  risperiDONE (RISPERDAL) 1 MG tablet Take 1 tablet (1 mg total) by mouth every morning. 08/31/16   Adonis BrookSheila Agustin, NP  risperiDONE (RISPERDAL) 3 MG tablet Take 1 tablet (3 mg total) by mouth at bedtime. 08/30/16   Adonis BrookSheila Agustin, NP  rivaroxaban (XARELTO) 20 MG TABS tablet Take 1 tablet (20 mg total) by mouth daily with supper. 08/30/16   Adonis BrookSheila Agustin, NP  traZODone (DESYREL) 50 MG tablet Take 50-100 mg by mouth at bedtime as needed for sleep.    Historical Provider, MD    Family History History reviewed. No pertinent family history.  Social History Social History  Substance Use Topics  . Smoking status: Current Every Day  Smoker    Packs/day: 0.25    Years: 4.00    Types: Cigarettes  . Smokeless tobacco: Never Used  . Alcohol use 1.2 oz/week    2 Cans of beer per week     Comment: Daily. Smells ETOH.      Allergies   Haldol [haloperidol]   Review of Systems Review of Systems  Musculoskeletal: Positive for myalgias. Negative for joint swelling.  Skin: Negative for color change and wound.  All other systems reviewed and are negative.    Physical Exam Updated Vital Signs BP 112/79 (BP Location: Left Arm)   Pulse  87   Temp 98.1 F (36.7 C) (Oral)   Resp 15   Ht 5\' 10"  (1.778 m)   Wt 223 lb (101.2 kg)   SpO2 98%   BMI 32.00 kg/m   Physical Exam  Constitutional: He appears well-developed and well-nourished. No distress.  HENT:  Head: Normocephalic and atraumatic.  Eyes: Conjunctivae are normal.  Neck: Neck supple.  Cardiovascular: Normal rate, regular rhythm and intact distal pulses.   Pulmonary/Chest: Effort normal and breath sounds normal. No respiratory distress.  Musculoskeletal:  Tenderness to the right anterior bicep without noted swelling, masses, or discoloration. FROM in the right arm at the shoulder, elbow, and wrist.   Lymphadenopathy:    He has no cervical adenopathy.    He has no axillary adenopathy.  Neurological: He is alert.  Strength is 5/5 in BUE. No sensory deficits.  Skin: Skin is warm and dry. Capillary refill takes less than 2 seconds. He is not diaphoretic.  Psychiatric: He has a normal mood and affect. His behavior is normal.  Nursing note and vitals reviewed.    ED Treatments / Results  DIAGNOSTIC STUDIES: Oxygen Saturation is 98% on RA, nl by my interpretation.    COORDINATION OF CARE: 12:08 AM Discussed treatment plan with pt at bedside which includes vicodin and pt agreed to plan.   Procedures Procedures (including critical care time)  Medications Ordered in ED Medications - No data to display   Initial Impression / Assessment and Plan / ED Course  I have reviewed the triage vital signs and the nursing notes.   Clinical Course     Patient presents with right arm pain beginning this morning. Tenderness is minor with no associated swelling or color change. Pt is compliant with his xarelto. Pt was not interested in any further investigation into his issue and only wanted a "pain pill and a sandwich." Patient may need a duplex ultrasound should symptoms fail to resolve or worsen. This was explained to the patient. Patient voiced understanding. Return  precautions discussed.   Vitals:   10/11/16 2316 10/11/16 2318  BP: 112/79   Pulse: 87   Resp: 15   Temp: 98.1 F (36.7 C)   TempSrc: Oral   SpO2: 98%   Weight:  101.2 kg  Height:  5\' 10"  (1.778 m)     Final Clinical Impressions(s) / ED Diagnoses   Final diagnoses:  Right arm pain    New Prescriptions Discharge Medication List as of 10/12/2016 12:16 AM     I personally performed the services described in this documentation, which was scribed in my presence. The recorded information has been reviewed and is accurate.    Anselm PancoastShawn C Ronalda Walpole, PA-C 10/13/16 [phone]    Anselm PancoastShawn C Marcell Chavarin, PA-C 10/13/16 [phone]    Devoria AlbeIva Knapp, MD 10/18/16 2259

## 2016-10-12 NOTE — Discharge Instructions (Signed)
You have been seen today for arm pain. You may need an ultrasound to check for a blood clot. Should you change your mind about having this done or symptoms worsen, [facility]. May use tylenol for pain.

## 2016-10-12 NOTE — ED Notes (Signed)
Pt asked for a drink to take his normal home medication with. PA approved. Pt given ginger ale.

## 2016-10-12 NOTE — ED Notes (Signed)
Pt decline d/c vitals. He states "Nah, I'm straight."

## 2016-10-18 ENCOUNTER — Encounter (HOSPITAL_COMMUNITY): Payer: Self-pay

## 2016-10-18 ENCOUNTER — Emergency Department (HOSPITAL_COMMUNITY)
Admission: EM | Admit: 2016-10-18 | Discharge: 2016-10-18 | Disposition: A | Discharge status: Home / Self Care | Payer: Medicaid Other | Attending: Emergency Medicine | Admitting: Emergency Medicine

## 2016-10-18 DIAGNOSIS — R440 Auditory hallucinations: Secondary | ICD-10-CM | POA: Diagnosis not present

## 2016-10-18 DIAGNOSIS — Z79899 Other long term (current) drug therapy: Secondary | ICD-10-CM

## 2016-10-18 DIAGNOSIS — F200 Paranoid schizophrenia: Secondary | ICD-10-CM | POA: Diagnosis not present

## 2016-10-18 DIAGNOSIS — Z7901 Long term (current) use of anticoagulants: Secondary | ICD-10-CM | POA: Diagnosis not present

## 2016-10-18 DIAGNOSIS — I10 Essential (primary) hypertension: Secondary | ICD-10-CM | POA: Diagnosis not present

## 2016-10-18 DIAGNOSIS — F17210 Nicotine dependence, cigarettes, uncomplicated: Secondary | ICD-10-CM | POA: Diagnosis not present

## 2016-10-18 DIAGNOSIS — F1010 Alcohol abuse, uncomplicated: Secondary | ICD-10-CM | POA: Diagnosis not present

## 2016-10-18 DIAGNOSIS — F99 Mental disorder, not otherwise specified: Secondary | ICD-10-CM | POA: Diagnosis not present

## 2016-10-18 DIAGNOSIS — Z888 Allergy status to other drugs, medicaments and biological substances status: Secondary | ICD-10-CM

## 2016-10-18 LAB — COMPREHENSIVE METABOLIC PANEL
ALBUMIN: 4.2 g/dL (ref 3.5–5.0)
ALT: 28 U/L (ref 17–63)
AST: 24 U/L (ref 15–41)
Alkaline Phosphatase: 51 U/L (ref 38–126)
Anion gap: 10 (ref 5–15)
BUN: 8 mg/dL (ref 6–20)
CHLORIDE: 104 mmol/L (ref 101–111)
CO2: 25 mmol/L (ref 22–32)
CREATININE: 0.95 mg/dL (ref 0.61–1.24)
Calcium: 9.2 mg/dL (ref 8.9–10.3)
GFR calc Af Amer: >60 mL/min (ref >60)
Glucose, Bld: 87 mg/dL (ref 65–99)
POTASSIUM: 3.4 mmol/L — AB (ref 3.5–5.1)
SODIUM: 139 mmol/L (ref 135–145)
Total Bilirubin: 0.2 mg/dL — ABNORMAL LOW (ref 0.3–1.2)
Total Protein: 7.6 g/dL (ref 6.5–8.1)

## 2016-10-18 LAB — CBC
HEMATOCRIT: 40 % (ref 39.0–52.0)
HEMOGLOBIN: 13.1 g/dL (ref 13.0–17.0)
MCH: 27.1 pg (ref 26.0–34.0)
MCHC: 32.8 g/dL (ref 30.0–36.0)
MCV: 82.8 fL (ref 78.0–100.0)
Platelets: 325 10*3/uL (ref 150–400)
RBC: 4.83 MIL/uL (ref 4.22–5.81)
RDW: 13.6 % (ref 11.5–15.5)
WBC: 6.8 10*3/uL (ref 4.0–10.5)

## 2016-10-18 LAB — SALICYLATE LEVEL

## 2016-10-18 LAB — ETHANOL: Alcohol, Ethyl (B): 149 mg/dL — ABNORMAL HIGH (ref <5)

## 2016-10-18 LAB — RAPID URINE DRUG SCREEN, HOSP PERFORMED
Amphetamines: NOT DETECTED
BARBITURATES: NOT DETECTED
BENZODIAZEPINES: NOT DETECTED
COCAINE: NOT DETECTED
OPIATES: NOT DETECTED
TETRAHYDROCANNABINOL: NOT DETECTED

## 2016-10-18 LAB — ACETAMINOPHEN LEVEL

## 2016-10-18 MED ORDER — RISPERIDONE 3 MG PO TABS: 3.0000 mg | ORAL_TABLET | Freq: Every day | ORAL | Status: DC

## 2016-10-18 MED ORDER — TRAZODONE HCL 50 MG PO TABS: 50.0000 mg | ORAL_TABLET | Freq: Every evening | ORAL | Status: DC | PRN

## 2016-10-18 MED ORDER — RISPERIDONE 3 MG PO TABS: 3.0000 mg | ORAL_TABLET | Freq: Every day | ORAL | 0 refills | Status: DC

## 2016-10-18 MED ORDER — RISPERIDONE 1 MG PO TABS
1.0000 mg | ORAL_TABLET | Freq: Every morning | ORAL | Status: DC
  Administered 2016-10-18: 1 mg via ORAL
  Filled 2016-10-18: qty 1

## 2016-10-18 MED ORDER — BENZTROPINE MESYLATE 1 MG PO TABS
0.5000 mg | ORAL_TABLET | Freq: Two times a day (BID) | ORAL | Status: DC
  Administered 2016-10-18: 0.5 mg via ORAL
  Filled 2016-10-18: qty 1

## 2016-10-18 MED ORDER — RISPERIDONE 1 MG PO TABS: 1.0000 mg | ORAL_TABLET | Freq: Every morning | ORAL | 0 refills | Status: DC

## 2016-10-18 MED ORDER — PRAVASTATIN SODIUM 40 MG PO TABS: 20.0000 mg | ORAL_TABLET | Freq: Every day | ORAL | Status: DC

## 2016-10-18 MED ORDER — RIVAROXABAN 20 MG PO TABS
20.0000 mg | ORAL_TABLET | Freq: Every day | ORAL | Status: DC
  Administered 2016-10-18: 20 mg via ORAL
  Filled 2016-10-18 (×2): qty 1

## 2016-10-18 NOTE — ED Notes (Signed)
Lunch ordered 

## 2016-10-18 NOTE — ED Triage Notes (Signed)
Pt states auditory hallucinations. Pt states hearing voices telling pt "they will kill him." Pt denies any SI or HI at this time. Pt denies any ETOH or drugs. Pt denies any visual hallucinations. Pt laughing at triage.

## 2016-10-18 NOTE — ED Notes (Signed)
Pt changed into personal clothing. Pt voiced understanding to follow up w/[facility] - resource guide given.

## 2016-10-18 NOTE — ED Notes (Signed)
Patient eating Breakfast.  

## 2016-10-18 NOTE — ED Provider Notes (Signed)
I was notified that patient is stable for discharge.  Pt will need an rx for his risperdal.   Cole DibblesJon Myrikal Messmer, MD 10/18/16 [phone]

## 2016-10-18 NOTE — Discharge Instructions (Signed)
Follow-up as instructed by behavioral health 

## 2016-10-18 NOTE — ED Notes (Signed)
Patient lunch delivered.  

## 2016-10-18 NOTE — ED Notes (Signed)
Patient aware of plan and agreeable.

## 2016-10-18 NOTE — ED Provider Notes (Signed)
By signing my name below, I, Linus GalasMaharshi Patel, attest that this documentation has been prepared under the direction and in the presence of Enbridge EnergyKristen N Ward, DO. Electronically Signed: Linus GalasMaharshi Patel, ED Scribe. 10/18/16. 3:03 AM.  TIME SEEN: 3:03 AM  CHIEF COMPLAINT:  Chief Complaint  Patient presents with  . Hallucinations   HPI:  HPI Comments: Cole Barrett is a 30 y.o. male with history of schizophrenia, hypertension, hyperlipidemia who presents to the Emergency Department complaining of auditory hallucinations that has been ongoing for the past 2 weeks. Pt states he hears voice that will say they will kill him. Pt states they don't tell him to kill himself or others. He denies any SI, HI, visual hallucinations, fevers, chills, CP, SOB, N/V/D, cough, abdominal pain, or any other symptoms at this time. Pt reports ETOH use. Pt denies drug use. Pt reports that he is not on any psychiatric medications and that he has never been admitted to the psychiatric hospital before. But this appears to be not the case per patient's medical records.  ROS: See HPI Constitutional: no fever  Eyes: no drainage  ENT: no runny nose   Cardiovascular:  no chest pain  Resp: no SOB  GI: no vomiting GU: no dysuria Integumentary: no rash  Allergy: no hives  Musculoskeletal: no leg swelling  Neurological: no slurred speech ROS otherwise negative  PAST MEDICAL HISTORY/PAST SURGICAL HISTORY:  Past Medical History:  Diagnosis Date  . Homelessness   . Hypercholesterolemia   . Mental disorder   . Paranoid behavior (HCC)   . PE (pulmonary embolism)   . Schizophrenia (HCC)    MEDICATIONS:  Prior to Admission medications   Medication Sig Start Date End Date Taking? Authorizing Provider  benztropine (COGENTIN) 0.5 MG tablet Take 1 tablet (0.5 mg total) by mouth 2 (two) times daily. 08/30/16  Yes Adonis BrookSheila Agustin, NP  cyclobenzaprine (FLEXERIL) 10 MG tablet Take 1 tablet (10 mg total) by mouth 2 (two)  times daily as needed for muscle spasms. 09/14/16  Yes Gilda Creasehristopher J Pollina, MD  ibuprofen (ADVIL,MOTRIN) 600 MG tablet Take 1 tablet (600 mg total) by mouth every 6 (six) hours as needed. 09/23/16  Yes Tiffany Neva SeatGreene, PA-C  pravastatin (PRAVACHOL) 20 MG tablet Take 20 mg by mouth at bedtime.   Yes Historical Provider, MD  pravastatin (PRAVACHOL) 20 MG tablet Take 1 tablet (20 mg total) by mouth daily. 09/25/16  Yes Tiffany Neva SeatGreene, PA-C  risperiDONE (RISPERDAL) 1 MG tablet Take 1 tablet (1 mg total) by mouth every morning. 08/31/16  Yes Adonis BrookSheila Agustin, NP  risperiDONE (RISPERDAL) 3 MG tablet Take 1 tablet (3 mg total) by mouth at bedtime. 08/30/16  Yes Adonis BrookSheila Agustin, NP  rivaroxaban (XARELTO) 20 MG TABS tablet Take 1 tablet (20 mg total) by mouth daily with supper. 08/30/16  Yes Adonis BrookSheila Agustin, NP  traZODone (DESYREL) 50 MG tablet Take 50-100 mg by mouth at bedtime as needed for sleep.   Yes Historical Provider, MD    ALLERGIES:  Allergies  Allergen Reactions  . Haldol [Haloperidol] Shortness Of Breath    SOCIAL HISTORY:  Social History  Substance Use Topics  . Smoking status: Current Every Day Smoker    Packs/day: 0.25    Years: 4.00    Types: Cigarettes  . Smokeless tobacco: Never Used  . Alcohol use 1.2 oz/week    2 Cans of beer per week     Comment: Daily. Smells ETOH.     FAMILY HISTORY: History reviewed. No pertinent family history.  EXAM: BP 118/82 (BP Location: Right Arm)   Pulse 90   Temp 98.1 F (36.7 C) (Oral)   Resp 18   Ht 5\' 10"  (1.778 m)   Wt 224 lb (101.6 kg)   SpO2 100%   BMI 32.14 kg/m  CONSTITUTIONAL: Alert and oriented and responds appropriately to questions. Appears intoxicated; well-nourished HEAD: Normocephalic EYES: Conjunctivae injected , PERRL, EOMI ENT: normal nose; no rhinorrhea; moist mucous membranes NECK: Supple, no meningismus, no nuchal rigidity, no LAD  CARD: RRR; S1 and S2 appreciated; no murmurs, no clicks, no rubs, no gallops RESP:  Normal chest excursion without splinting or tachypnea; breath sounds clear and equal bilaterally; no wheezes, no rhonchi, no rales, no hypoxia or respiratory distress, speaking full sentences ABD/GI: Normal bowel sounds; non-distended; soft, non-tender, no rebound, no guarding, no peritoneal signs, no hepatosplenomegaly BACK:  The back appears normal and is non-tender to palpation, there is no CVA tenderness EXT: Normal ROM in all joints; non-tender to palpation; no edema; normal capillary refill; no cyanosis, no calf tenderness or swelling    SKIN: Normal color for age and race; warm; no rash NEURO: Moves all extremities equally, sensation to light touch intact diffusely, cranial nerves II through XII intact, normal speech PSYCH: The patient's mood and manner are appropriate. Grooming and personal hygiene are appropriate, auditory hallucinations that say they will kill him, no SI, HI, or visual hallucinations   MEDICAL DECISION MAKING: 3:35 AM  Patient here with auditory hallucinations. Has been drinking alcohol. Alcohol level is 149 at 1:48 AM. Has no current medical complaints. At this time is medically cleared. We will consult TTS.  ED PROGRESS: 6:30 AM  Per TTS note, they recommend inpatient treatment and I agree.  Patient is voluntary at this time.   I reviewed all nursing notes, vitals, pertinent old records, EKGs, labs, imaging (as available).      I personally performed the services described in this documentation, which was scribed in my presence. The recorded information has been reviewed and is accurate.    Layla MawKristen N Ward, DO 10/18/16 [phone]

## 2016-10-18 NOTE — BH Assessment (Signed)
Tele Assessment Note   Cole HawkingChristopher Adam Barrett is an 30 y.o. male who presents to the ED due to hearing voices that tell him they are going to kill him. Pt denies SI , HI , and reports he has not engaged in self-harming behaviors. Pt reports he is not currently taking medication for his psychosis and he does not have an OPT provider. Pt reports he [facility] due to experiencing hallucinations and hearing voices. When pt was asked about his overall mood, he stated "it's cool."   Pt presented to be in a relaxed demeanor and denied symptoms of depression. Pt states he has a good appetite and gets "a lot of sleep." Pt responded to most of the assessors questions with "yes" or "nah" and was unable to identify any recent stressors or changes that led to his recent psychosis.   Per Nira ConnJason Berry, FNP pt meets criteria for inpt treatment. Jazz, RN has been notified.   Diagnosis: Schizophrenia   Past Medical History:  Past Medical History:  Diagnosis Date   Homelessness    Hypercholesterolemia    Mental disorder    Paranoid behavior (HCC)    PE (pulmonary embolism)    Schizophrenia (HCC)     History reviewed. No pertinent surgical history.  Family History: History reviewed. No pertinent family history.  Social History:  reports that he has been smoking Cigarettes.  He has a 1.00 pack-year smoking history. He has never used smokeless tobacco. He reports that he drinks about 1.2 oz of alcohol per week . He reports that he uses drugs, including Marijuana.  Additional Social History:  Alcohol / Drug Use Pain Medications: Pt denies abuse Prescriptions: pt denies abuse Over the Counter: pt denies abuse  History of alcohol / drug use?: Yes Substance #1 Name of Substance 1: Alcohol 1 - Age of First Use: 18 1 - Amount (size/oz): Two 40 oz beers 1 - Frequency: every other day 1 - Duration: Ongoing  1 - Last Use / Amount: yesterday Substance #2 Name of Substance 2:  Marijuana 2 - Age of First Use: Unknown 2 - Amount (size/oz): "1/2 a blunt" 2 - Frequency: Every 2-3 months 2 - Duration: ongoing  2 - Last Use / Amount: "a couple of weeks ago"  CIWA: CIWA-Ar BP: 118/82 Pulse Rate: 90 COWS:    PATIENT STRENGTHS: (choose at least two) Capable of independent living Communication skills General fund of knowledge  Allergies:  Allergies  Allergen Reactions   Haldol [Haloperidol] Shortness Of Breath    Home Medications:  (Not in a hospital admission)  OB/GYN Status:  No LMP for male patient.  General Assessment Data [facility] Assessment: In system Is this a Tele or Face-to-Face Assessment?: Tele Assessment Is this an Initial Assessment or a Re-assessment for this encounter?: Initial Assessment Marital status: Single Is patient pregnant?: No Pregnancy Status: No Living Arrangements: Alone Can pt return to current living arrangement?: Yes Admission Status: Voluntary Is patient capable of signing voluntary admission?: Yes Referral Source: Self/Family/Friend Insurance type: Medicaid     Crisis Care Plan Living Arrangements: Alone Name of Psychiatrist: None Name of Therapist: None  Education Status Is patient currently in school?: No Highest grade of school patient has completed: 12th  Risk to self with the past 6 months Suicidal Ideation: No Has patient been a risk to self within the past 6 months prior to admission? : No Suicidal Intent: No Has patient had any suicidal  intent within the past 6 months prior to admission? : No Is patient at risk for suicide?: No Suicidal Plan?: No Has patient had any suicidal plan within the past 6 months prior to admission? : No Access to Means: No What has been your use of drugs/alcohol within the last 12 months?: reports to drinking alcohol yesterday Previous Attempts/Gestures: No Triggers for Past Attempts: None known Intentional Self Injurious Behavior:  None Family Suicide History: No Recent stressful life event(s): Other (Comment) (hearing voices ) Persecutory voices/beliefs?: No Depression: No Substance abuse history and/or treatment for substance abuse?: No Suicide prevention information given to non-admitted patients: Not applicable  Risk to Others within the past 6 months Homicidal Ideation: No Does patient have any lifetime risk of violence toward others beyond the six months prior to admission? : No Thoughts of Harm to Others: No Current Homicidal Intent: No Current Homicidal Plan: No Access to Homicidal Means: No History of harm to others?: No Assessment of Violence: None Noted Does patient have access to weapons?: No Criminal Charges Pending?: No Does patient have a court date: No Is patient on probation?: No  Psychosis Hallucinations: Auditory Delusions: None noted  Mental Status Report Appearance/Hygiene: In scrubs, Unremarkable Eye Contact: Poor Motor Activity: Freedom of movement Speech: Slurred, Soft Level of Consciousness: Drowsy Mood: Euthymic Affect: Flat, Blunted Anxiety Level: None Thought Processes: Coherent, Relevant Judgement: Partial Orientation: Person, Place, Time, Situation, Appropriate for developmental age Obsessive Compulsive Thoughts/Behaviors: None  Cognitive Functioning Concentration: Fair Memory: Recent Intact, Remote Impaired IQ: Average Insight: Fair Impulse Control: Fair Appetite: Good Sleep: No Change Total Hours of Sleep: 8 Vegetative Symptoms: None  ADLScreening [name]) Patient's cognitive ability adequate to safely complete daily activities?: Yes Patient able to express need for assistance with ADLs?: Yes Independently performs ADLs?: Yes (appropriate for developmental age)  Prior Inpatient Therapy Prior Inpatient Therapy: Yes Prior Therapy Dates: 2017 Prior Therapy Facilty/Provider([facility] [place] Reason for Treatment:  Schizophrenia   Prior Outpatient Therapy Prior Outpatient Therapy: No Does patient have an ACCT team?: No Does patient have Intensive In-House Services?  : No Does patient have [facility] services? : No Does patient have P4CC services?: No  ADL Screening (condition at time of admission) Patient's cognitive ability adequate to safely complete daily activities?: Yes Is the patient deaf or have difficulty hearing?: No Does the patient have difficulty seeing, even when wearing glasses/contacts?: No Does the patient have difficulty concentrating, remembering, or making decisions?: No Patient able to express need for assistance with ADLs?: Yes Does the patient have difficulty dressing or bathing?: No Independently performs ADLs?: Yes (appropriate for developmental age) Does the patient have difficulty walking or climbing stairs?: No Weakness of Legs: None Weakness of Arms/Hands: None  Home Assistive Devices/Equipment Home Assistive Devices/Equipment: None    Abuse/Neglect Assessment (Assessment to be complete while patient is alone) Physical Abuse: Denies Verbal Abuse: Denies Sexual Abuse: Denies Exploitation of patient/patient's resources: Denies Self-Neglect: Denies     Merchant navy officerAdvance Directives (For Healthcare) Does patient have an advance directive?: No Would patient like information on creating an advanced directive?: No - patient declined information    Additional Information 1:1 In Past 12 Months?: No CIRT Risk: No Elopement Risk: No Does patient have medical clearance?: Yes     Disposition:  Disposition Initial Assessment Completed for this Encounter: Yes Disposition of Patient: Inpatient treatment program Type of inpatient treatment program: Adult (per Nira ConnJason Berry, FNP)  Karolee OhsAquicha R Duff 10/18/2016 6:22 AM

## 2016-10-18 NOTE — ED Notes (Signed)
Patient states, " the medication stop the voices" " patient states, " I have a wedding to go to Saturday and I already have my ticket". [facility] request to be reassess. BH states Reassessment will not take place until PM shift.

## 2016-10-18 NOTE — Consult Note (Signed)
[facility] Tele-Psychiatry Consult   Reason for Consult:  Hallucinations  Referring Physician:  EDP Patient Identification: Cole Barrett MRN:  [id number] Principal Diagnosis: Paranoid Schizophrenia  Diagnosis:   Patient Active Problem List   Diagnosis Date Noted  . Alcohol abuse [F10.10]   . Auditory hallucination [R44.0]   . Alcohol-induced mood disorder ([place]) [F10.94] 09/08/2016  . Adjustment disorder with depressed mood [F43.21] 09/07/2016  . Homelessness [Z59.0] 09/03/2016  . Person feigning illness [Z76.5] 09/03/2016  . 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 Cannabis use disorder, mild, abuse [F12.10] 08/29/2016  . Paranoid schizophrenia ([place]) [F20.0] 08/26/2016  . Pulmonary emboli ([place]) [I26.99] 07/13/2016  . Pulmonary embolism and infarction ([place]) [I26.99] 01/02/2014  . Acute left flank pain [R10.9] 01/02/2014  . Pulmonary embolism, bilateral ([place]) [I26.99] 01/02/2014    Total Time spent with patient: 20 minutes  Subjective:   Cole Barrett is a 30 y.o. male patient does not warrant admission.  HPI: Cole Barrett is a 30 yo male who presented to the [facility] with hallucinations.  He was released from [facility] on 08/30/2016 after being stabilized on Risperdal.  Since this time, it is not clear if the patient has gone to his outpatient appointment but continues to come to the ED for shelter.  Not responding to internal stimuli, no suicidal/homicidal ideations or alcohol/drug abuse.  Stable for discharge. Patient is focused today on "needing to get to a wedding this weekend. I just ran out of my medications for a week. I was getting them in [place]. The voices stopped after I got some Risperdal. Everyone has been very nice to me here.  Encouraged him to go to [facility] for medication management and requested patient be provided with a prescription for his Risperdal. Per nursing staff in pod C the patient frequently presents to the hospital with report of psychosis.  Review of his chart indicates that patient has had numerous psychiatric consultations over September and October 2017. The patient was most recently discharged home by Dr. Darleene Cleaver on 09/13/2016 in stable condition after his medications were re-started to address his chronic mental illness. At this time the patient does not meet any criteria for inpatient admission and is stable for discharge. The patient reports having a place to stay following discharge from the [facility].   Past Psychiatric History: Schizophrenia   Risk to Self: Suicidal Ideation: No Suicidal Intent: No Is patient at risk for suicide?: No Suicidal Plan?: No Access to Means: No What has been your use of drugs/alcohol within the last 12 months?: reports to drinking alcohol yesterday Triggers for Past Attempts: None known Intentional Self Injurious Behavior: None Risk to Others: Homicidal Ideation: No Thoughts of Harm to Others: No Current Homicidal Intent: No Current Homicidal Plan: No Access to Homicidal Means: No History of harm to others?: No Assessment of Violence: None Noted Does patient have access to weapons?: No Criminal Charges Pending?: No Does patient have a court date: Nonone Prior Inpatient Therapy: Prior Inpatient Therapy: Yes Prior Therapy Dates: 2017 Prior Therapy Facilty/Provider(s): [facility] & [facility] Reason for Treatment: Schizophrenia [facility]  Prior Outpatient Therapy: Prior Outpatient Therapy: No Does patient have an ACCT team?: No Does patient have Intensive In-House Services?  : No Does patient have [facility] services? : No Does patient have [facility]CC services?: [facility]  Past Medical History:  Past Medical History:  Diagnosis Date  . Homelessness   . Hypercholesterolemia   . Mental disorder   . Paranoid behavior ([place])   .  PE (pulmonary embolism)   . Schizophrenia ([place])    History reviewed. No pertinent surgical history. Family History: History reviewed. No pertinent family  history. Family Psychiatric  History: none Social History:  History  Alcohol Use  . 1.2 oz/week  . 2 Cans of beer per week    Comment: Daily. Smells ETOH.      History  Drug Use  . Types: Marijuana    Comment: once every 3 months    Social History   Social History  . Marital status: Single    Spouse name: N/A  . Number of children: N/A  . Years of education: N/A   Social History Main Topics  . Smoking status: Current Every Day Smoker    Packs/day: 0.25    Years: 4.00    Types: Cigarettes  . Smokeless tobacco: Never Used  . Alcohol use 1.2 oz/week    2 Cans of beer per week     Comment: Daily. Smells ETOH.   . Drug use:     Types: Marijuana     Comment: once every 3 months  . Sexual activity: No   Other Topics Concern  . None   Social History Narrative  . None   Additional Social History:    Allergies:   Allergies  Allergen Reactions  . Haldol [Haloperidol] Shortness Of Breath    Labs:  Results for orders placed or performed during the hospital encounter of 10/18/16 (from the past 48 hour(s))  Comprehensive metabolic panel     Status: Abnormal   Collection Time: 10/18/16  1:48 AM  Result Value Ref Range   Sodium 139 135 - 145 mmol/L   Potassium 3.4 (L) 3.5 - 5.1 mmol/L   Chloride 104 101 - 111 mmol/L   CO2 25 22 - 32 mmol/L   Glucose, Bld 87 65 - 99 mg/dL   BUN 8 6 - 20 mg/dL   Creatinine, Ser 0.95 0.61 - 1.24 mg/dL   Calcium 9.2 8.9 - 10.3 mg/dL   Total Protein 7.6 6.5 - 8.1 g/dL   Albumin 4.2 3.5 - 5.0 g/dL   AST 24 15 - 41 U/L   ALT 28 17 - 63 U/L   Alkaline Phosphatase 51 38 - 126 U/L   Total Bilirubin 0.2 (L) 0.3 - 1.2 mg/dL   GFR calc non Af Amer >60 >60 mL/min   GFR calc Af Amer >60 >60 mL/min    Comment: (NOTE) The eGFR has been calculated using the CKD EPI equation. This calculation has not been validated in all clinical situations. eGFR's persistently <60 mL/min signify possible Chronic Kidney Disease.    Anion gap 10 5 - 15   Ethanol     Status: Abnormal   Collection Time: 10/18/16  1:48 AM  Result Value Ref Range   Alcohol, Ethyl (B) 149 (H) <5 mg/dL    Comment:        LOWEST DETECTABLE LIMIT FOR SERUM ALCOHOL IS 5 mg/dL FOR MEDICAL PURPOSES ONLY   Salicylate level     Status: None   Collection Time: 10/18/16  1:48 AM  Result Value Ref Range   Salicylate Lvl <6.5 2.8 - 30.0 mg/dL  Acetaminophen level     Status: Abnormal   Collection Time: 10/18/16  1:48 AM  Result Value Ref Range   Acetaminophen (Tylenol), Serum <10 (L) 10 - 30 ug/mL    Comment:        THERAPEUTIC CONCENTRATIONS VARY SIGNIFICANTLY. A RANGE OF 10-30 ug/mL MAY BE AN  EFFECTIVE CONCENTRATION FOR MANY PATIENTS. HOWEVER, SOME ARE BEST TREATED AT CONCENTRATIONS OUTSIDE THIS RANGE. ACETAMINOPHEN CONCENTRATIONS >150 ug/mL AT 4 HOURS AFTER INGESTION AND >50 ug/mL AT 12 HOURS AFTER INGESTION ARE OFTEN ASSOCIATED WITH TOXIC REACTIONS.   cbc     Status: None   Collection Time: 10/18/16  1:48 AM  Result Value Ref Range   WBC 6.8 4.0 - 10.5 K/uL   RBC 4.83 4.22 - 5.81 MIL/uL   Hemoglobin 13.1 13.0 - 17.0 g/dL   HCT 40.0 39.0 - 52.0 %   MCV 82.8 78.0 - 100.0 fL   MCH 27.1 26.0 - 34.0 pg   MCHC 32.8 30.0 - 36.0 g/dL   RDW 13.6 11.5 - 15.5 %   Platelets 325 150 - 400 K/uL  Rapid urine drug screen (hospital performed)     Status: None   Collection Time: 10/18/16  1:49 AM  Result Value Ref Range   Opiates NONE DETECTED NONE DETECTED   Cocaine NONE DETECTED NONE DETECTED   Benzodiazepines NONE DETECTED NONE DETECTED   Amphetamines NONE DETECTED NONE DETECTED   Tetrahydrocannabinol NONE DETECTED NONE DETECTED   Barbiturates NONE DETECTED NONE DETECTED    Comment:        DRUG SCREEN FOR MEDICAL PURPOSES ONLY.  IF CONFIRMATION IS NEEDED FOR ANY PURPOSE, NOTIFY LAB WITHIN 5 DAYS.        LOWEST DETECTABLE LIMITS FOR URINE DRUG SCREEN Drug Class       Cutoff (ng/mL) Amphetamine      1000 Barbiturate      200 Benzodiazepine    683 Tricyclics       419 Opiates          300 Cocaine          300 THC              50     Current Facility-Administered Medications  Medication Dose Route Frequency Provider Last Rate Last Dose  . benztropine (COGENTIN) tablet 0.5 mg  0.5 mg Oral BID Kristen N Ward, DO   0.5 mg at 10/18/16 1015  . pravastatin (PRAVACHOL) tablet 20 mg  20 mg Oral QHS Kristen N Ward, DO      . risperiDONE (RISPERDAL) tablet 1 mg  1 mg Oral q morning - 10a Kristen N Ward, DO   1 mg at 10/18/16 1015  . risperiDONE (RISPERDAL) tablet 3 mg  3 mg Oral QHS Kristen N Ward, DO      . rivaroxaban (XARELTO) tablet 20 mg  20 mg Oral Q supper Kristen N Ward, DO      . traZODone (DESYREL) tablet 50-100 mg  50-100 mg Oral QHS PRN Delice Bison Ward, DO       Current Outpatient Prescriptions  Medication Sig Dispense Refill  . benztropine (COGENTIN) 0.5 MG tablet Take 1 tablet (0.5 mg total) by mouth 2 (two) times daily. 60 tablet 0  . pravastatin (PRAVACHOL) 20 MG tablet Take 20 mg by mouth at bedtime.    . pravastatin (PRAVACHOL) 20 MG tablet Take 1 tablet (20 mg total) by mouth daily. 30 tablet 0  . risperiDONE (RISPERDAL) 1 MG tablet Take 1 tablet (1 mg total) by mouth every morning. 30 tablet 0  . risperiDONE (RISPERDAL) 3 MG tablet Take 1 tablet (3 mg total) by mouth at bedtime. 30 tablet 0  . rivaroxaban (XARELTO) 20 MG TABS tablet Take 1 tablet (20 mg total) by mouth daily with supper. 30 tablet 0  . traZODone (DESYREL) 50  MG tablet Take 50-100 mg by mouth at bedtime as needed for sleep.      Musculoskeletal:  Unable to assess via camera   Psychiatric Specialty Exam: Physical Exam  Psychiatric: He has a normal mood and affect. His speech is normal and behavior is normal. Judgment and thought content normal. Cognition and memory are normal.    Review of Systems  Constitutional: Negative.   HENT: Negative.   Eyes: Negative.   Respiratory: Negative.   Cardiovascular: Negative.   Gastrointestinal: Negative.    Genitourinary: Negative.   Musculoskeletal: Negative.   Skin: Negative.   Neurological: Negative.   Endo/Heme/Allergies: Negative.   Psychiatric/Behavioral: Positive for substance abuse (Alcohol level 149 on admission). Negative for depression, hallucinations, memory loss and suicidal ideas. The patient is not nervous/anxious and does not have insomnia.     Blood pressure 116/57, pulse 88, temperature 98.2 F (36.8 C), temperature source Oral, resp. rate 18, height _0  (1.778 m), weight 101.6 kg (224 lb), SpO2 96 %.Body mass index is 32.14 kg/m.  General Appearance: Casual  Eye Contact:  Good  Speech:  Normal Rate  Volume:  Normal  Mood:  Anxious  Affect:  Congruent  Thought Process:  Coherent and Descriptions of Associations: Intact  Orientation:  Full (Time, Place, and Person)  Thought Content:  WDL  Suicidal Thoughts:  No  Homicidal Thoughts:  No  Memory:  Immediate;   Good Recent;   Good Remote;   Good  Judgement:  Fair  Insight:  Fair  Psychomotor Activity:  Normal  Concentration:  Concentration: Good and Attention Span: Good  Recall:  Good  Fund of Knowledge:  Fair  Language:  Good  Akathisia:  No  Handed:  Right  AIMS (if indicated):     Assets:  Leisure Time Physical Health Resilience  ADL's:  Intact  Cognition:  WNL  Sleep:        Treatment Plan Summary:  -Medication management: Continue Risperdal 1 mg every morning and 3 mg at bedtime for psychosis along with Cogentin 0.5 mg BID for EPS  -Individual counseling -[facility] referral along with IRC/homeless resources.  Disposition: No evidence of imminent risk to self or others at present.   Patient does not meet criteria for psychiatric inpatient admission.  Elmarie Shiley, NP 10/18/2016 5:53 PM

## 2016-10-18 NOTE — ED Notes (Signed)
Pt arrived to [facility] - ambulatory w/sitter. Pt's belongings - 3 labeled belongings bags and black jacket placed at nurses' desk for inventory. Pt aware. Pt given extra lunch tray.

## 2016-10-18 NOTE — ED Notes (Addendum)
Pt signed Medical Clearance Pt policy form - voiced understanding of policies for [facility]. States is ready to be d/c'd to home d/t no longer experiencing AH - denies SI/HI.

## 2016-10-18 NOTE — ED Notes (Signed)
Patient given ginger ale. 

## 2016-10-23 ENCOUNTER — Encounter (HOSPITAL_COMMUNITY): Payer: Self-pay | Admitting: Emergency Medicine

## 2016-10-23 ENCOUNTER — Emergency Department (HOSPITAL_COMMUNITY)
Admission: EM | Admit: 2016-10-23 | Discharge: 2016-10-23 | Disposition: A | Discharge status: Home / Self Care | Payer: Medicaid Other | Attending: Emergency Medicine | Admitting: Emergency Medicine

## 2016-10-23 DIAGNOSIS — F17210 Nicotine dependence, cigarettes, uncomplicated: Secondary | ICD-10-CM | POA: Diagnosis not present

## 2016-10-23 DIAGNOSIS — Z7901 Long term (current) use of anticoagulants: Secondary | ICD-10-CM | POA: Insufficient documentation

## 2016-10-23 DIAGNOSIS — R443 Hallucinations, unspecified: Secondary | ICD-10-CM

## 2016-10-23 DIAGNOSIS — Z79899 Other long term (current) drug therapy: Secondary | ICD-10-CM | POA: Diagnosis not present

## 2016-10-23 DIAGNOSIS — R440 Auditory hallucinations: Secondary | ICD-10-CM | POA: Diagnosis present

## 2016-10-23 DIAGNOSIS — F209 Schizophrenia, unspecified: Secondary | ICD-10-CM | POA: Diagnosis not present

## 2016-10-23 LAB — CBC WITH DIFFERENTIAL/PLATELET
Basophils Absolute: 0 10*3/uL (ref 0.0–0.1)
Basophils Relative: 0 %
EOS ABS: 0.2 10*3/uL (ref 0.0–0.7)
EOS PCT: 3 %
HCT: 35.6 % — ABNORMAL LOW (ref 39.0–52.0)
Hemoglobin: 11.6 g/dL — ABNORMAL LOW (ref 13.0–17.0)
LYMPHS ABS: 1.4 10*3/uL (ref 0.7–4.0)
Lymphocytes Relative: 25 %
MCH: 27 pg (ref 26.0–34.0)
MCHC: 32.6 g/dL (ref 30.0–36.0)
MCV: 82.8 fL (ref 78.0–100.0)
Monocytes Absolute: 0.6 10*3/uL (ref 0.1–1.0)
Monocytes Relative: 11 %
Neutro Abs: 3.3 10*3/uL (ref 1.7–7.7)
Neutrophils Relative %: 61 %
PLATELETS: 280 10*3/uL (ref 150–400)
RBC: 4.3 MIL/uL (ref 4.22–5.81)
RDW: 13.9 % (ref 11.5–15.5)
WBC: 5.5 10*3/uL (ref 4.0–10.5)

## 2016-10-23 LAB — COMPREHENSIVE METABOLIC PANEL
ALT: 32 U/L (ref 17–63)
ANION GAP: 12 (ref 5–15)
AST: 33 U/L (ref 15–41)
Albumin: 3.9 g/dL (ref 3.5–5.0)
Alkaline Phosphatase: 50 U/L (ref 38–126)
BUN: 12 mg/dL (ref 6–20)
CHLORIDE: 103 mmol/L (ref 101–111)
CO2: 23 mmol/L (ref 22–32)
Calcium: 8.9 mg/dL (ref 8.9–10.3)
Creatinine, Ser: 1.1 mg/dL (ref 0.61–1.24)
GFR calc non Af Amer: >60 mL/min (ref >60)
Glucose, Bld: 72 mg/dL (ref 65–99)
POTASSIUM: 3.5 mmol/L (ref 3.5–5.1)
SODIUM: 138 mmol/L (ref 135–145)
Total Bilirubin: 0.6 mg/dL (ref 0.3–1.2)
Total Protein: 6.9 g/dL (ref 6.5–8.1)

## 2016-10-23 LAB — ETHANOL: ALCOHOL ETHYL (B): 43 mg/dL — AB (ref <5)

## 2016-10-23 LAB — RAPID URINE DRUG SCREEN, HOSP PERFORMED
AMPHETAMINES: NOT DETECTED
BARBITURATES: NOT DETECTED
BENZODIAZEPINES: NOT DETECTED
Cocaine: NOT DETECTED
Opiates: NOT DETECTED
Tetrahydrocannabinol: POSITIVE — AB

## 2016-10-23 MED ORDER — ALUM & MAG HYDROXIDE-SIMETH 200-200-20 MG/5ML PO SUSP: 30.0000 mL | ORAL | Status: DC | PRN

## 2016-10-23 MED ORDER — BENZTROPINE MESYLATE 1 MG PO TABS: 0.5000 mg | ORAL_TABLET | Freq: Two times a day (BID) | ORAL | Status: DC

## 2016-10-23 MED ORDER — RISPERIDONE 3 MG PO TABS: 3.0000 mg | ORAL_TABLET | Freq: Every day | ORAL | Status: DC

## 2016-10-23 MED ORDER — RIVAROXABAN 20 MG PO TABS: 20.0000 mg | ORAL_TABLET | Freq: Every day | ORAL | Status: DC

## 2016-10-23 MED ORDER — RISPERIDONE 3 MG PO TABS: 3.0000 mg | ORAL_TABLET | Freq: Every day | ORAL | 0 refills | Status: DC

## 2016-10-23 MED ORDER — RISPERIDONE 1 MG PO TABS: 1.0000 mg | ORAL_TABLET | Freq: Every morning | ORAL | Status: DC

## 2016-10-23 MED ORDER — PRAVASTATIN SODIUM 40 MG PO TABS: 20.0000 mg | ORAL_TABLET | Freq: Every day | ORAL | Status: DC

## 2016-10-23 MED ORDER — ONDANSETRON HCL 4 MG PO TABS: 4.0000 mg | ORAL_TABLET | Freq: Three times a day (TID) | ORAL | Status: DC | PRN

## 2016-10-23 MED ORDER — TRAZODONE HCL 50 MG PO TABS: 50.0000 mg | ORAL_TABLET | Freq: Every evening | ORAL | Status: DC | PRN

## 2016-10-23 MED ORDER — RISPERIDONE 1 MG PO TABS: 1.0000 mg | ORAL_TABLET | Freq: Every morning | ORAL | 0 refills | Status: DC

## 2016-10-23 MED ORDER — ACETAMINOPHEN 325 MG PO TABS: 650.0000 mg | ORAL_TABLET | ORAL | Status: DC | PRN

## 2016-10-23 MED ORDER — IBUPROFEN 400 MG PO TABS: 600.0000 mg | ORAL_TABLET | Freq: Three times a day (TID) | ORAL | Status: DC | PRN

## 2016-10-23 NOTE — BH Assessment (Signed)
Tele Assessment Note   Cole Barrett is an 30 y.o. male.  -Clinician reviewed note by Marlon Peliffany Greene, PA-C.  Pt has PMH of homelessness, mental disorder, PE, paranoid behavior and schizophrenia, hx of alcohol abuse, auditory hallucination. Pt has been to the ED many times for the same. Concern for misuse of the ER for shelter per note review. The patient has prescriptions for Resperidal in hand, he did not get it filled because he said he had to go to work and has not had time to do it yet. He says that he needs to go inpatient and talk to the psychiatrist. The voices keep saying they are going to kill him and are disturbing. Not telling him to kill himself or others. Pt smells of marijuana but denies using or being around anyone else using. Denies illicit drug use or alcohol use.  Patient has had 10 [facility] assessments since the first on 08/21/16.  Patient questions this number but clinician pointed out that patient had some no more than 2 days apart.  Patient was last seen on 11/21 and at that time was discharged with prescription for risperidol and encouragement to [facility].  Patient says that he has been busy working and has been unable to fill the prescription.  He says "I need to go inpatient and see the psychiatrist, I need a doctor."  Patient complains of hearing voices telling him they are going to kill him.  The voices do not tell him he should kill or harm others however.  Patient denies the voices tell him to kill himself.  Pt has no SI, intention or plan.  No HI intention or plan.  Patient has no visual hallucinations.  Patient downplays hie ETOH and marijuana use.  He is positive for marijuana on his UDS but claims that last use was over 2 months ago.  After questioning him on this, he says He smoked 28 days ago.  Patient says he drinks two beers at a time when he drinks.  He says it is 3-4 times per week.  Patient denies any depressive symptoms,  no symptoms of paranoia.  Patient denies any disturbance of sleep, attention, appetite.  Patient has not followed up on outpatient resources.  [facility].  [facility].  [place] [facility] [place].  -Clinician discussed patient care with Nira ConnJason Berry, FNP who recommends patient be discharged to complete getting his prescription and [facility].  Clinician discussed patient care with Dr. Preston FleetingGlick and he is in agreement.   Diagnosis: Schizophrenia  Past Medical History:  Past Medical History:  Diagnosis Date  . Homelessness   . Hypercholesterolemia   . Mental disorder   . Paranoid behavior (HCC)   . PE (pulmonary embolism)   . Schizophrenia (HCC)     History reviewed. No pertinent surgical history.  Family History: History reviewed. No pertinent family history.  Social History:  reports that he has been smoking Cigarettes.  He has a 1.00 pack-year smoking history. He has never used smokeless tobacco. He reports that he drinks about 1.2 oz of alcohol per week . He reports that he uses drugs, including Marijuana.  Additional Social History:  Alcohol / Drug Use Pain Medications: None Prescriptions: "I got to get some new medicines." Over the Counter: None History of alcohol / drug use?: Yes Substance #1 Name of Substance 1: ETOH 1 -  Age of First Use: Teens 1 - Amount (size/oz): 2 beers 1 - Frequency: 4-5 times in a week 1 - Duration: on-going 1 - Last Use / Amount: 11/26 Substance #2 Name of Substance 2: Marijuana 2 - Age of First Use: Teens 2 - Amount (size/oz): Varies 2 - Frequency: Once every 3 months 2 - Duration: ongoing 2 - Last Use / Amount: Within the last month  CIWA: CIWA-Ar BP: 135/92 Pulse Rate: 102 COWS:    PATIENT STRENGTHS: (choose at least two) Ability for insight Average or above average intelligence Capable of independent living Communication  skills  Allergies:  Allergies  Allergen Reactions  . Haldol [Haloperidol] Shortness Of Breath    Home Medications:  (Not in a hospital admission)  OB/GYN Status:  No LMP for male patient.  General Assessment Data [facility] Assessment: In system Is this a Tele or Face-to-Face Assessment?: Tele Assessment Is this an Initial Assessment or a Re-assessment for this encounter?: Initial Assessment Marital status: Single Is patient pregnant?: No Pregnancy Status: No Living Arrangements: Alone Can pt return to current living arrangement?: Yes Admission Status: Voluntary Is patient capable of signing voluntary admission?: Yes Referral Source: Self/Family/Friend Insurance type: MCD     Crisis Care Plan Living Arrangements: Alone Name of Psychiatrist: None Name of Therapist: None  Education Status Is patient currently in school?: No Highest grade of school patient has completed: 12th  Risk to self with the past 6 months Suicidal Ideation: No Has patient been a risk to self within the past 6 months prior to admission? : No Suicidal Intent: No Has patient had any suicidal intent within the past 6 months prior to admission? : No Is patient at risk for suicide?: No Suicidal Plan?: No Has patient had any suicidal plan within the past 6 months prior to admission? : No Access to Means: No What has been your use of drugs/alcohol within the last 12 months?: ETOH & THC Previous Attempts/Gestures: No How many times?: 0 Other Self Harm Risks: N/A Triggers for Past Attempts: None known Intentional Self Injurious Behavior: None Family Suicide History: No Recent stressful life event(s): Other (Comment) (Pt cannot identify a stressor.) Persecutory voices/beliefs?: No Depression: No Depression Symptoms:  (Pt denies depressive symptoms) Substance abuse history and/or treatment for substance abuse?: Yes Suicide prevention information given to non-admitted  patients: Not applicable  Risk to Others within the past 6 months Homicidal Ideation: No Does patient have any lifetime risk of violence toward others beyond the six months prior to admission? : No Thoughts of Harm to Others: No Comment - Thoughts of Harm to Others: None Current Homicidal Intent: No Current Homicidal Plan: No Access to Homicidal Means: No Identified Victim: No one History of harm to others?: No Assessment of Violence: None Noted Violent Behavior Description: None noted Does patient have access to weapons?: No Criminal Charges Pending?: No Describe Pending Criminal Charges: None Does patient have a court date: No Court Date:  (Pt denies) Is patient on probation?: No  Psychosis Hallucinations: Auditory (Hearing voices saying they are going to kill him.) Delusions: None noted  Mental Status Report Appearance/Hygiene: Poor hygiene, Disheveled, Body odor, In scrubs Eye Contact: Fair Motor Activity: Freedom of movement, Restlessness Speech: Logical/coherent Level of Consciousness: Alert Mood: Pleasant Affect: Appropriate to circumstance Anxiety Level: None Thought Processes: Coherent, Relevant Judgement: Impaired Orientation: Person, Place, Time, Situation, Appropriate for developmental age Obsessive Compulsive Thoughts/Behaviors: None  Cognitive Functioning Concentration: Normal Memory: Recent Intact, Remote Intact IQ:  Average Insight: Good Impulse Control: Good Appetite: Good Weight Loss: 0 Weight Gain: 0 Sleep: No Change Total Hours of Sleep: 8 Vegetative Symptoms: None  ADLScreening [facility] Assessment Services) Patient's cognitive ability adequate to safely complete daily activities?: Yes Patient able to express need for assistance with ADLs?: Yes Independently performs ADLs?: Yes (appropriate for developmental age)  Prior Inpatient Therapy Prior Inpatient Therapy: Yes Prior Therapy Dates: 2017 Prior Therapy Facilty/Provider(s): [facility]ility in [facility] Reason for Treatment: Schizophrenia   Prior Outpatient Therapy Prior Outpatient Therapy: No Prior Therapy Dates: na Prior Therapy Facilty/Provider(s): na Reason for Treatment: na Does patient have an ACCT team?: No Does patient have Intensive In-House Services?  : No Does patient have [facility] services? : No Does patient have P4CC services?: No  ADL Screening (condition at time of admission) Patient's cognitive ability adequate to safely complete daily activities?: Yes Is the patient deaf or have difficulty hearing?: No Does the patient have difficulty seeing, even when wearing glasses/contacts?: No Does the patient have difficulty concentrating, remembering, or making decisions?: No Patient able to express need for assistance with ADLs?: Yes Does the patient have difficulty dressing or bathing?: No Independently performs ADLs?: Yes (appropriate for developmental age) Does the patient have difficulty walking or climbing stairs?: No Weakness of Legs: None Weakness of Arms/Hands: None       Abuse/Neglect Assessment (Assessment to be complete while patient is alone) Physical Abuse: Denies Verbal Abuse: Denies Sexual Abuse: Denies Exploitation of patient/patient's resources: Denies Self-Neglect: Denies     Merchant navy officer (For Healthcare) Does Patient Have a Medical Advance Directive?: No    Additional Information 1:1 In Past 12 Months?: No CIRT Risk: No Elopement Risk: No Does patient have medical clearance?: Yes     Disposition:  Disposition Initial Assessment Completed for this Encounter: Yes Disposition of Patient: Other dispositions Type of inpatient treatment program: Adult Other disposition(s): Other (Comment) (Pt to be reviewed by NP)  Alexandria Lodge 10/23/2016 6:44 AM

## 2016-10-23 NOTE — Discharge Instructions (Signed)
It is vitally important that you get your prescriptions filled, and take them as prescribed!

## 2016-10-23 NOTE — ED Triage Notes (Signed)
Pt coming to the ER and states that he "need to go to inpatient because I keep hearing voices in my head." Pt dies SI/HI. Pt states the voices are not telling him to do anything. Pt states that voices are saying "that they are going to kill me." Pt does not appear to be having any auditory hallucination. Pt does have previous discharge instructions with two prescriptions for Risperdal still attached. Pt states he has not had a chance to go to a pharmacy due to being at work and being out of town.   Pt smells strongly of marijuana.

## 2016-10-23 NOTE — ED Notes (Signed)
TTS being completed at this time. ?

## 2016-10-23 NOTE — ED Provider Notes (Signed)
[facility]-EM[facility] DEPT Provider Note   CSN: [id number] Arrival date & time: 10/23/16  0343  History   Chief Complaint No chief complaint on file.   HPI Cole Barrett is a 30 y.o. male.  HPI   Pt has PMH of homelessness, mental disorder, PE, paranoid behavior and schizophrenia, hx of alcohol abuse, auditory hallucination. Pt has been to the ED many times for the same. Concern for misuse of the ER for shelter per note review. The patient has prescriptions for Resperidal in hand, he did not get it filled because he said he had to go to work and has not had time to do it yet. He says that he needs to go inpatient and talk to the psychiatrist. The voices keep saying they are going to kill him and are disturbing. Not telling him to kill himself or others. Pt smells of marijuana but denies using or being around anyone else using. Denies illicit drug use or alcohol use.  SI/HI  Past Medical History:  Diagnosis Date  . Homelessness   . Hypercholesterolemia   . Mental disorder   . Paranoid behavior (HCC)   . PE (pulmonary embolism)   . Schizophrenia [facility](HCC)     Patient Active Problem List   Diagnosis Date Noted  . Alcohol abuse   . Auditory hallucination   . 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 Adjustment disorder with depressed mood 09/07/2016  . Homelessness 09/03/2016  . Person feigning illness 09/03/2016  . 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 Cannabis use disorder, mild, abuse 08/29/2016  . Paranoid schizophrenia (HCC) 08/26/2016  . Pulmonary emboli (HCC) 07/13/2016  . 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 Acute left flank pain 01/02/2014  . Pulmonary embolism, bilateral (HCC) 01/02/2014    History reviewed. No pertinent surgical history.     Home Medications    Prior to Admission medications   Medication Sig Start Date End Date Taking? Authorizing Provider  benztropine (COGENTIN) 0.5 MG tablet Take 1 tablet (0.5 mg total) by  mouth 2 (two) times daily. 08/30/16  Yes Adonis BrookSheila Agustin, NP  pravastatin (PRAVACHOL) 20 MG tablet Take 20 mg by mouth at bedtime.   Yes Historical Provider, MD  pravastatin (PRAVACHOL) 20 MG tablet Take 1 tablet (20 mg total) by mouth daily. 09/25/16  Yes Marilynne Dupuis Neva SeatGreene, PA-C  risperiDONE (RISPERDAL) 1 MG tablet Take 1 tablet (1 mg total) by mouth every morning. 10/18/16  Yes Linwood DibblesJon Knapp, MD  risperiDONE (RISPERDAL) 3 MG tablet Take 1 tablet (3 mg total) by mouth at bedtime. 10/18/16  Yes Linwood DibblesJon Knapp, MD  rivaroxaban (XARELTO) 20 MG TABS tablet Take 1 tablet (20 mg total) by mouth daily with supper. 08/30/16  Yes Adonis BrookSheila Agustin, NP  traZODone (DESYREL) 50 MG tablet Take 50-100 mg by mouth at bedtime as needed for sleep.   Yes Historical Provider, MD    Family History History reviewed. No pertinent family history.  Social History Social History  Substance Use Topics  . Smoking status: Current Every Day Smoker    Packs/day: 0.25    Years: 4.00    Types: Cigarettes  . Smokeless tobacco: Never Used  . Alcohol use 1.2 oz/week    2 Cans of beer per week     Comment: Daily. Smells ETOH.      Allergies   Haldol [haloperidol]   Review of Systems Review of Systems  Review of Systems All other systems negative except as documented in the HPI. All pertinent positives and negatives as  reviewed in the HPI.  Physical Exam Updated Vital Signs BP 135/92   Pulse 102   Temp 97.4 F (36.3 C) (Oral)   Resp 17   Ht 6' (1.829 m)   Wt 99.8 kg   SpO2 97%   BMI 29.84 kg/m   Physical Exam CONSTITUTIONAL: Alert and oriented and responds appropriately to questions. Appears intoxicated; well-nourished HEAD: Normocephalic EYES: Conjunctivae injected , PERRL, EOMI ENT: normal nose; no rhinorrhea; moist mucous membranes NECK: Supple, no meningismus, no nuchal rigidity, no LAD  CARD: RRR; S1 and S2 appreciated; no murmurs, no clicks, no rubs, no gallops RESP: Normal chest excursion without splinting  or tachypnea; breath sounds clear and equal bilaterally; no wheezes, no rhonchi, no rales, no hypoxia or respiratory distress, speaking full sentences ABD/GI: Normal bowel sounds; non-distended; soft, non-tender, no rebound, no guarding, no peritoneal signs, no hepatosplenomegaly BACK:  The back appears normal and is non-tender to palpation, there is no CVA tenderness EXT: Normal ROM in all joints; non-tender to palpation; no edema; normal capillary refill; no cyanosis, no calf tenderness or swelling    SKIN: Normal color for age and race; warm; no rash NEURO: Moves all extremities equally, sensation to light touch intact diffusely, cranial nerves II through XII intact, normal speech PSYCH: The patient's mood and manner are appropriate. Grooming and personal hygiene are appropriate, auditory hallucinations that say they will kill him, no SI, HI, or visual hallucinations  ED Treatments / Results  Labs (all labs ordered are listed, but only abnormal results are displayed) Labs Reviewed  ETHANOL - Abnormal; Notable for the following:       Result Value   Alcohol, Ethyl (B) 43 (*)    All other components within normal limits  CBC WITH DIFFERENTIAL/PLATELET - Abnormal; Notable for the following:    Hemoglobin 11.6 (*)    HCT 35.6 (*)    All other components within normal limits  COMPREHENSIVE METABOLIC PANEL  RAPID URINE DRUG SCREEN, HOSP PERFORMED    EKG  EKG Interpretation None       Radiology No results found.  Procedures Procedures (including critical care time)  Medications Ordered in ED Medications  ibuprofen (ADVIL,MOTRIN) tablet 600 mg (not administered)  acetaminophen (TYLENOL) tablet 650 mg (not administered)  ondansetron (ZOFRAN) tablet 4 mg (not administered)  alum & mag hydroxide-simeth (MAALOX/MYLANTA) 200-200-20 MG/5ML suspension 30 mL (not administered)   Initial Impression / Assessment and Plan / ED Course  I have reviewed the triage vital signs and the  nursing notes.  Pertinent labs & imaging results that were available during my care of the patient were reviewed by me and considered in my medical decision making (see chart for details).  Clinical Course     Will have TTS evaluate patient, he said that he is concerned he needs inpatient and can't function in the state he is in. He has prescriptions in hand.   Psych holding orders placed Home meds reviewed and ordered as appropriate TTS consult ordered Labs pending   Vitals:   10/23/16 0354  BP: 135/92  Pulse: 102  Resp: 17  Temp: 97.4 F (36.3 C)     Final Clinical Impressions(s) / ED Diagnoses   Final diagnoses:  Schizophrenia, unspecified type (HCC)  Hallucinations    New Prescriptions New Prescriptions   No medications on file     Marlon Peliffany Zafiro Routson, Cordelia Poche-C 10/23/16 0546    Dione Boozeavid Glick, MD 10/23/16 [phone]

## 2016-10-23 NOTE — ED Notes (Signed)
Report to [place] rn

## 2016-10-23 NOTE — Care Management Note (Signed)
Case Management Note  Patient Details  Name: Cole HawkingChristopher Adam Barrett MRN: [id number] Date of Birth: 02/27/1986  Subjective/Objective: Multiple EDV for this 30 yo M. Awaiting [facility]. No CM needs identified at this time                   Action/Plan: CM will be available should disposition/discharge needs arise.    Expected Discharge Date:                  Expected Discharge Plan:     In-House Referral:     Discharge planning Services     Post Acute Care Choice:    Choice offered to:     DME Arranged:    DME Agency:     HH Arranged:    HH Agency:     Status of Service:     If discussed at [employer] Meetings, dates discussed:    Additional Comments:  Yvone NeuCrutchfield, Teva Bronkema M, RN 10/23/2016, 9:21 AM

## 2016-10-23 NOTE — ED Notes (Signed)
Declined W/C at D/C and was escorted to lobby by RN. 

## 2016-10-23 NOTE — ED Notes (Signed)
Pt changing into blue scrubs. ?

## 2016-10-30 ENCOUNTER — Emergency Department (HOSPITAL_COMMUNITY)
Admission: EM | Admit: 2016-10-30 | Discharge: 2016-10-31 | Disposition: A | Discharge status: Home / Self Care | Payer: Medicaid Other | Attending: Emergency Medicine | Admitting: Emergency Medicine

## 2016-10-30 ENCOUNTER — Encounter (HOSPITAL_COMMUNITY): Payer: Self-pay | Admitting: Emergency Medicine

## 2016-10-30 DIAGNOSIS — F17210 Nicotine dependence, cigarettes, uncomplicated: Secondary | ICD-10-CM | POA: Insufficient documentation

## 2016-10-30 DIAGNOSIS — Z7901 Long term (current) use of anticoagulants: Secondary | ICD-10-CM | POA: Insufficient documentation

## 2016-10-30 DIAGNOSIS — F209 Schizophrenia, unspecified: Secondary | ICD-10-CM | POA: Diagnosis not present

## 2016-10-30 DIAGNOSIS — Z79899 Other long term (current) drug therapy: Secondary | ICD-10-CM | POA: Diagnosis not present

## 2016-10-30 DIAGNOSIS — R440 Auditory hallucinations: Secondary | ICD-10-CM | POA: Diagnosis present

## 2016-10-30 LAB — RAPID URINE DRUG SCREEN, HOSP PERFORMED
Amphetamines: NOT DETECTED
Barbiturates: NOT DETECTED
Benzodiazepines: NOT DETECTED
Cocaine: NOT DETECTED
Opiates: NOT DETECTED
Tetrahydrocannabinol: NOT DETECTED

## 2016-10-30 LAB — CBC
HCT: 38 % — ABNORMAL LOW (ref 39.0–52.0)
Hemoglobin: 12.6 g/dL — ABNORMAL LOW (ref 13.0–17.0)
MCH: 27.3 pg (ref 26.0–34.0)
MCHC: 33.2 g/dL (ref 30.0–36.0)
MCV: 82.3 fL (ref 78.0–100.0)
Platelets: 332 10*3/uL (ref 150–400)
RBC: 4.62 MIL/uL (ref 4.22–5.81)
RDW: 13.9 % (ref 11.5–15.5)
WBC: 5.6 10*3/uL (ref 4.0–10.5)

## 2016-10-30 LAB — COMPREHENSIVE METABOLIC PANEL WITH GFR
ALT: 37 U/L (ref 17–63)
AST: 30 U/L (ref 15–41)
Albumin: 4 g/dL (ref 3.5–5.0)
Alkaline Phosphatase: 48 U/L (ref 38–126)
Anion gap: 12 (ref 5–15)
BUN: 10 mg/dL (ref 6–20)
CO2: 20 mmol/L — ABNORMAL LOW (ref 22–32)
Calcium: 8.8 mg/dL — ABNORMAL LOW (ref 8.9–10.3)
Chloride: 106 mmol/L (ref 101–111)
Creatinine, Ser: 0.92 mg/dL (ref 0.61–1.24)
GFR calc Af Amer: >60 mL/min
GFR calc non Af Amer: >60 mL/min
Glucose, Bld: 82 mg/dL (ref 65–99)
Potassium: 3.4 mmol/L — ABNORMAL LOW (ref 3.5–5.1)
Sodium: 138 mmol/L (ref 135–145)
Total Bilirubin: 0.5 mg/dL (ref 0.3–1.2)
Total Protein: 7.2 g/dL (ref 6.5–8.1)

## 2016-10-30 LAB — ETHANOL: Alcohol, Ethyl (B): 115 mg/dL — ABNORMAL HIGH

## 2016-10-30 MED ORDER — RISPERIDONE 1 MG PO TABS: 1.0000 mg | ORAL_TABLET | Freq: Every morning | ORAL | 0 refills | Status: DC

## 2016-10-30 MED ORDER — TRAZODONE HCL 50 MG PO TABS: 50.0000 mg | ORAL_TABLET | Freq: Every evening | ORAL | 0 refills | Status: DC | PRN

## 2016-10-30 MED ORDER — BENZTROPINE MESYLATE 0.5 MG PO TABS: 0.5000 mg | ORAL_TABLET | Freq: Two times a day (BID) | ORAL | 0 refills | Status: DC

## 2016-10-30 MED ORDER — RISPERIDONE 3 MG PO TABS: 3.0000 mg | ORAL_TABLET | Freq: Every day | ORAL | 0 refills | Status: DC

## 2016-10-30 NOTE — ED Provider Notes (Signed)
[facility]-EM[facility] DEPT Provider Note   CSN: [id number] Arrival date & time: 10/30/16  2158  By signing my name below, I, Teofilo PodMatthew P. Jamison, attest that this documentation has been prepared under the direction and in the presence of Azalia BilisKevin Catlin Doria, MD . Electronically Signed: Teofilo PodMatthew P. Jamison, ED Scribe. 10/30/2016. 11:37 PM.    History   Chief Complaint Chief Complaint  Patient presents with  . auditory hallucinations   The history is provided by the patient. No language interpreter was used.   HPI Comments:  Cole Barrett is a 30 y.o. male with PMHx of schizophrenia who presents to the Emergency Department complaining of intermittent auditory hallucinations x 1 year. Pt states that the voices are saying that they want to kill him. Pt denies previous similar hallucinations. Pt was seen at the ED 1 week ago for schizophrenia and hallucinations. Pt states that he has been drinking EtOH less than he normally does. No alleviating factors noted. Pt denies other associated symptoms.   Past Medical History:  Diagnosis Date  . Homelessness   . Hypercholesterolemia   . Mental disorder   . Paranoid behavior (HCC)   . PE (pulmonary embolism)   . Schizophrenia [facility](HCC)     Patient Active Problem List   Diagnosis Date Noted  . Alcohol abuse   . Auditory hallucination   . 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 Adjustment disorder with depressed mood 09/07/2016  . Homelessness 09/03/2016  . Person feigning illness 09/03/2016  . 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 Cannabis use disorder, mild, abuse 08/29/2016  . Paranoid schizophrenia (HCC) 08/26/2016  . Pulmonary emboli (HCC) 07/13/2016  . 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 Acute left flank pain 01/02/2014  . Pulmonary embolism, bilateral (HCC) 01/02/2014    History reviewed. No pertinent surgical history.     Home Medications    Prior to Admission medications   Medication Sig Start Date  End Date Taking? Authorizing Provider  benztropine (COGENTIN) 0.5 MG tablet Take 1 tablet (0.5 mg total) by mouth 2 (two) times daily. 08/30/16   Adonis BrookSheila Agustin, NP  pravastatin (PRAVACHOL) 20 MG tablet Take 20 mg by mouth at bedtime.    Historical Provider, MD  pravastatin (PRAVACHOL) 20 MG tablet Take 1 tablet (20 mg total) by mouth daily. 09/25/16   Tiffany Neva SeatGreene, PA-C  risperiDONE (RISPERDAL) 1 MG tablet Take 1 tablet (1 mg total) by mouth every morning. 10/23/16   Dione Boozeavid Glick, MD  risperiDONE (RISPERDAL) 3 MG tablet Take 1 tablet (3 mg total) by mouth at bedtime. 10/23/16   Dione Boozeavid Glick, MD  rivaroxaban (XARELTO) 20 MG TABS tablet Take 1 tablet (20 mg total) by mouth daily with supper. 08/30/16   Adonis BrookSheila Agustin, NP  traZODone (DESYREL) 50 MG tablet Take 50-100 mg by mouth at bedtime as needed for sleep.    Historical Provider, MD    Family History No family history on file.  Social History Social History  Substance Use Topics  . Smoking status: Current Every Day Smoker    Packs/day: 0.25    Years: 4.00    Types: Cigarettes  . Smokeless tobacco: Never Used  . Alcohol use 1.2 oz/week    2 Cans of beer per week     Comment: Daily. Smells ETOH.      Allergies   Haldol [haloperidol]   Review of Systems Review of Systems 10 Systems reviewed and are negative for acute change except as noted in the HPI.   Physical Exam  Updated Vital Signs BP 120/74 (BP Location: Left Arm)   Pulse 80   Temp 97.7 F (36.5 C) (Oral)   Resp 14   Ht 5\' 10"  (1.778 m)   Wt 220 lb (99.8 kg)   SpO2 100%   BMI 31.57 kg/m   Physical Exam  Constitutional: He is oriented to person, place, and time. He appears well-developed and well-nourished.  HENT:  Head: Normocephalic.  Eyes: EOM are normal.  Neck: Normal range of motion.  Pulmonary/Chest: Effort normal.  Abdominal: He exhibits no distension.  Musculoskeletal: Normal range of motion.  Neurological: He is alert and oriented to person, place,  and time.  Psychiatric: He has a normal mood and affect.  Nursing note and vitals reviewed.    ED Treatments / Results   DIAGNOSTIC STUDIES:  Oxygen Saturation is 100% on RA, normal by my interpretation.    COORDINATION OF CARE:  11:37 PM Discussed treatment plan with pt at bedside and pt agreed to plan.   Labs (all labs ordered are listed, but only abnormal results are displayed) Labs Reviewed  COMPREHENSIVE METABOLIC PANEL - Abnormal; Notable for the following:       Result Value   Potassium 3.4 (*)    CO2 20 (*)    Calcium 8.8 (*)    All other components within normal limits  ETHANOL - Abnormal; Notable for the following:    Alcohol, Ethyl (B) 115 (*)    All other components within normal limits  CBC - Abnormal; Notable for the following:    Hemoglobin 12.6 (*)    HCT 38.0 (*)    All other components within normal limits  RAPID URINE DRUG SCREEN, HOSP PERFORMED    EKG  EKG Interpretation None       Radiology No results found.  Procedures Procedures (including critical care time)  Medications Ordered in ED Medications - No data to display   Initial Impression / Assessment and Plan / ED Course  I have reviewed the triage vital signs and the nursing notes.  Pertinent labs & imaging results that were available during my care of the patient were reviewed by me and considered in my medical decision making (see chart for details).  Clinical Course     Long-standing history of auditory hallucinations.  These are not command hallucinations.  He is not homicidal or suicidal at this time.  He is noncompliant with his medications.  He's been given new prescriptions for his medications.  I do not think he needs acute psychiatric hospitalization.  Discharge home in good condition.  Final Clinical Impressions(s) / ED Diagnoses   Final diagnoses:  Schizophrenia, unspecified type [facility](HCC)    New Prescriptions Discharge Medication List as of 10/30/2016 11:45 PM       I personally performed the services described in this documentation, which was scribed in my presence. The recorded information has been reviewed and is accurate.        Azalia BilisKevin Arihana Ambrocio, MD 10/31/16 [phone]

## 2016-10-30 NOTE — ED Triage Notes (Signed)
Reports auditory hallucinations x 2 days.  Denies SI/HI.

## 2016-11-02 ENCOUNTER — Emergency Department (HOSPITAL_COMMUNITY)
Admission: EM | Admit: 2016-11-02 | Discharge: 2016-11-02 | Disposition: A | Discharge status: Home / Self Care | Payer: Medicaid Other | Attending: Emergency Medicine | Admitting: Emergency Medicine

## 2016-11-02 ENCOUNTER — Emergency Department (HOSPITAL_COMMUNITY): Payer: Medicaid Other

## 2016-11-02 ENCOUNTER — Encounter (HOSPITAL_COMMUNITY): Payer: Self-pay | Admitting: Emergency Medicine

## 2016-11-02 DIAGNOSIS — Y9372 Activity, wrestling: Secondary | ICD-10-CM | POA: Insufficient documentation

## 2016-11-02 DIAGNOSIS — Z7901 Long term (current) use of anticoagulants: Secondary | ICD-10-CM | POA: Diagnosis not present

## 2016-11-02 DIAGNOSIS — M25512 Pain in left shoulder: Secondary | ICD-10-CM | POA: Insufficient documentation

## 2016-11-02 DIAGNOSIS — Y999 Unspecified external cause status: Secondary | ICD-10-CM | POA: Diagnosis not present

## 2016-11-02 DIAGNOSIS — F17210 Nicotine dependence, cigarettes, uncomplicated: Secondary | ICD-10-CM | POA: Insufficient documentation

## 2016-11-02 DIAGNOSIS — Y929 Unspecified place or not applicable: Secondary | ICD-10-CM | POA: Diagnosis not present

## 2016-11-02 DIAGNOSIS — X58XXXA Exposure to other specified factors, initial encounter: Secondary | ICD-10-CM | POA: Diagnosis not present

## 2016-11-02 MED ORDER — ACETAMINOPHEN 325 MG PO TABS
650.0000 mg | ORAL_TABLET | Freq: Once | ORAL | Status: AC
  Administered 2016-11-02: 650 mg via ORAL
  Filled 2016-11-02: qty 2

## 2016-11-02 NOTE — Discharge Instructions (Signed)
RESOURCE GUIDE ° °Chronic Pain Problems: °Contact [facility]  [phone] °Patients need to be referred by their primary care doctor. ° °Insufficient Money for Medicine: °Contact [employer] Way:  call "211" or [facility] 271-5999. ° °No Primary Care Doctor: °Call [facility]  832-8000 - can help you locate a primary care doctor that  accepts your insurance, provides certain services, etc. °Physician Referral Service- 1-800-533-3463 ° °Agencies that provide inexpensive medical care: °[facility]  [phone] °[facility]  832-7272 °[facility]  [phone] °[facility]  832-4777 °Planned Parenthood  373-0678 °[name]  272-1050 ° °Medicaid-accepting [name] Providers: °[facility]- [address] ° [phone], Mon-Fri 9am-7pm, Sat 9am-1pm °[facility]- [address], [address] ° [phone] °[facility]- [address] ° [phone] °[facility]- [address] [place] [address] ° [phone] °[facility]- [address], [phone] ° Only accepts [place] Access Medicaid patients after they have their name  applied to their card ° °Self Pay (no insurance) in [name]: °Sickle Cell Patients: Dr Eric Dean, [name] ° [address], [phone] °[facility]- [address] ° [phone] °      -     [facility]- [address] °      -     [facility]- see information above (Speak to Pam H if you do not have insurance) °      -  [facility]- [address], [phone] °      -  [facility] [place]- [address],  [phone] °      -  [facility]- [address] [place] [address], [phone] °      -  Dr Osei-Bonsu-  [address], [place], [phone] °      -  [facility]- [address], [phone] °      -   [facility]- [address] [place] [address], [phone], also [address], [phone] °      -    [facility]- [address], [phone], 1st & 3rd Saturday   every month, 10am-1pm ° °1) Find a Doctor and Pay Out of Pocket °Although you won't have to find out who is covered by your insurance plan, it is a good idea to ask around and get recommendations. You will then need to call the office and see if the doctor you have chosen will accept you as a new patient and what types of options they offer for patients who are self-pay. Some doctors offer discounts or will set up payment plans for their patients who do not have insurance, but you will need to ask so you aren't surprised when you get to your appointment. ° °2) Contact Your Local Health Department °Not all health departments have doctors that can see patients for sick visits, but many do, so it is worth a call to see if yours does. If you don't know where your local health department is, you can check in your phone book. The CDC also has a tool to help you locate your state's health department, and many state websites also have listings of all of their local health departments. ° °3) Find a Walk-in Clinic °If   your illness is not likely to be very severe or complicated, you may want to try a walk in clinic. These are popping up all over the country in pharmacies, drugstores, and shopping centers. They're usually staffed by nurse practitioners or physician assistants that have been trained to treat common illnesses and complaints. They're usually fairly quick and inexpensive. However, if you have serious medical issues or chronic medical problems, these are probably not your best option ° °STD Testing °[name] [facility] [place], [facility] Clinic, [address], [place], phone [phone] or 1-877-539-9860.  Monday - Friday, call for an appointment. °[name] [facility] [place], STD  Clinic, [address], [place], phone [phone] or 1-877-539-9860.  Monday - Friday, call for an appointment. ° °Abuse/Neglect: °[name] Child Abuse Hotline (336) 641-3795 °[name] Child Abuse Hotline 800-378-5315 (After Hours) ° °Emergency Shelter:  [facility] (336) 271-5985 ° °Maternity Homes: °[facility] (336) 275-9566 °[facility] (704) 372-4663 ° °MRSA Hotline #:   832-7006 ° °[place] Resources ° °[facility] [place]  [employer] [place] [facility] °[address]                 [address]         [address]  °[place]                                               [place]                              [place] °Phone:  349-3220                                  Phone:  342-7768                   Phone:  342-8140 ° °[place] [facility], 342-8316 °[place] [facility] - [facility]- 1-888-581-9988 °      -     [facility] in [place], [address],                                  336-349-4454, Insurance ° °[place] Child Abuse Hotline °(336) 342-1394 or (336) 342-3537 (After Hours) ° ° °Behavioral Health Services ° °Substance Abuse Resources: °[facility]  336-882-2125 °[facility] 336-784-9470 °[facility] 336-285-9073 °[facility] 336-845-3988 °Residential & Outpatient Substance Abuse Program  800-659-3381 ° °Psychological Services: °[facility]  832-9600 °[facility]  378-7881 °[name], [address], [place], ACCESS LINE: 1-800-853-5163 or 336-641-4981, Http://[email or web address]/adult.htm ° °Dental Assistance ° °If unable to pay or uninsured, contact:  [facility] or [name] [facility] to become qualified for the adult dental clinic. ° °Patients with Medicaid: [facility] °[address], 632-0744 °[address], [phone] ° °If unable to pay, or uninsured, contact [facility] (271-5999) or [name] [facility] (641-3152 in [place], 842-7733 in [place]) to become qualified for the adult dental clinic ° °Other Low-Cost   Community Dental Services: °[facility]- [address], [place], [place], [address], 723-1848, Ext. [address], 2nd and 4th Thursday of the month at 6:30am.  10 clients each day by appointment, can sometimes see walk-in patients if someone does not show for an appointment. °[facility]- [address], [place], [place], [address], 723-7904 °[facility]- [address], [place], [place], [address], 631-2330 °[place] [facility]- 342-8273 °[facility]- 703-3100 °[facility]- 570-6415 ° °Please make every effort to establish with a primary care physician for routine medical care ° °Adult Health Services  °The [name] [facility] provides a wide range of adult health services. Some of these services are designed to address the healthcare needs of all [name] residents and all services are designed to meet the needs of uninsured/underinsured low income residents. Some services are available to any resident of [place], call [phone] for details. °] °[facility], a new medical clinic for adults, is now open. For more information about the Center and its services please call [phone]. °For information on our [facility] services, click here. ° °For more information on any of the following Department of Public Health programs, including hours of service, click on the highlighted link. ° °SERVICES FOR WOMEN (Adults and Teens) °Family Planning Services provide a full range of birth control options plus education and counseling. New patient visit and annual return visits include a complete  examination, pap test as indicated, and other laboratory as indicated. Included is our Regional Vasectomy Program for men. ° °Maternity Care is provided through pregnancy, including a six week post partum exam. Women who meet eligibility criteria for the Medicaid for Pregnant Women program, receive care free. Other women are charged on a sliding scale according to income. °Note: [facility] provides services to pregnant women who have a Medicaid card. Call [phone] for an appointment in [place] or [phone] for an appointment in [place]. ° °Primary Care for Medicaid [place] Access Women is available through the [name] [facility]. As primary care provider for the [place] Medicaid [place] Access Medicaid Managed Care program, women may designate the Women’s Health clinic as their primary care provider. ° °PLEASE CALL [phone] FOR AN APPOINTMENT FOR THE ABOVE SERVICES IN EITHER [place] OR [place]. Information available in English and Spanish.  ° °Childbirth Education Classes are open to the public and offered to help families prepare for the best possible childbirth experience as well as to promote lifelong health and wellness. Classes are offered throughout the year and meet on the same night once a week for five weeks. Medicaid covers the cost of the classes for the mother-to-be and her partner. For participants without Medicaid, the cost of the class series is $45.00 for the mother-to-be and her partner. Class size is limited and registration is required. For more information or to register call 336-641-4718. Baby items donated by [employer] and the [employer] [place] are given away during each class series. ° °SERVICES FOR WOMEN AND MEN °Sexually Transmitted Infection appointments, including HIV testing, are available daily (weekdays, except holidays). Call early as same-day appointments are limited. For an appointment in either [place] or [place],  call [phone]. Services are confidential and free of charge. ° °Skin Testing for Tuberculosis Please call [phone]. °Adult Immunizations are available, usually for a fee. Please call [phone] for details. ° °PLEASE CALL [phone] FOR AN APPOINTMENT FOR THE ABOVE   SERVICES IN EITHER [place] OR [place].  ° °[facility] provides up to the minute recommended vaccines for your travel destination. We also provide essential health and political information to help insure a safe and pleasurable travel experience. This program is self-sustaining, however, fees are very competitive. We are a CERTIFIED YE[facility] [employer]EVER IMMUNIZATION approved clinic site. °PLEASE CALL [phone] FOR AN APPOINTMENT IN EITHER [place] OR [place].  ° °If you have questions about the services listed above, we want to answer them! Email us at: [email or web address] °Home Visiting Services for elderly and the disabled are available to residents of [name] who are in need of care that compares to the care offered by a nursing home, have needs that can be met by the program, and have [place] Medicaid. Other short term services are available to residents 18 years and older who are unable to meet requirements for eligibility to receive services from a certified home health agency, spend the majority of time at home, and need care for six months or less. ° °PLEASE CALL [phone] OR [phone] FOR MORE INFORMATION. °Medication Assistance Program serves as a link between pharmaceutical companies and patients to provide low cost or free prescription medications. This servce is available for residents who meet certain income restrictions and have no insurance coverage. ° °PLEASE CALL [phone] ([place]) OR [phone] ([place]) FOR MORE INFORMATION.  °Updated Feb. 21, 2013 ° ° °

## 2016-11-02 NOTE — ED Triage Notes (Signed)
Pt presents to ED for assessment of left shoulder pain.  Pt poor historian and having difficulty explaining how he injured it.  Pt sts "it just hurts all the time".  Pt denies increase of pain with movement, pt has full movement of upper left extremity.

## 2016-11-02 NOTE — ED Provider Notes (Signed)
[facility] Provider Note   CSN: [id number] Arrival date & time: 11/02/16  0145  History   Chief Complaint Chief Complaint  Patient presents with  . Shoulder Pain    HPI Cole Barrett is a 30 y.o. male.  HPI   Pt is homeless, with paranoid and schizophrenia comes o the ER for evaluation of a left shoulder injury. He said he was wrestling and playing when he injured it. He says it is fine and not bothering him now after icing. He requests a dose of Tylenol and a sandwich. He denies any other concerns or injuries at this time.  Past Medical History:  Diagnosis Date  . Homelessness   . Hypercholesterolemia   . Mental disorder   . Paranoid behavior (HCC)   . PE (pulmonary embolism)   . Schizophrenia [facility](HCC)     Patient Active Problem List   Diagnosis Date Noted  . Alcohol abuse   . Auditory hallucination   . 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 Adjustment disorder with depressed mood 09/07/2016  . Homelessness 09/03/2016  . Person feigning illness 09/03/2016  . 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 Cannabis use disorder, mild, abuse 08/29/2016  . Paranoid schizophrenia (HCC) 08/26/2016  . Pulmonary emboli (HCC) 07/13/2016  . 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 Acute left flank pain 01/02/2014  . Pulmonary embolism, bilateral (HCC) 01/02/2014    History reviewed. No pertinent surgical history.     Home Medications    Prior to Admission medications   Medication Sig Start Date End Date Taking? Authorizing Provider  benztropine (COGENTIN) 0.5 MG tablet Take 1 tablet (0.5 mg total) by mouth 2 (two) times daily. 10/30/16   Azalia BilisKevin Campos, MD  pravastatin (PRAVACHOL) 20 MG tablet Take 20 mg by mouth at bedtime.    Historical Provider, MD  pravastatin (PRAVACHOL) 20 MG tablet Take 1 tablet (20 mg total) by mouth daily. 09/25/16   Kadden Osterhout Cole SeatGreene, PA-C  risperiDONE (RISPERDAL) 1 MG tablet Take 1 tablet (1 mg total) by mouth  every morning. 10/30/16   Azalia BilisKevin Campos, MD  risperiDONE (RISPERDAL) 3 MG tablet Take 1 tablet (3 mg total) by mouth at bedtime. 10/30/16   Azalia BilisKevin Campos, MD  rivaroxaban (XARELTO) 20 MG TABS tablet Take 1 tablet (20 mg total) by mouth daily with supper. 08/30/16   Cole BrookSheila Agustin, NP  traZODone (DESYREL) 50 MG tablet Take 1-2 tablets (50-100 mg total) by mouth at bedtime as needed for sleep. 10/30/16   Azalia BilisKevin Campos, MD    Family History History reviewed. No pertinent family history.  Social History Social History  Substance Use Topics  . Smoking status: Current Every Day Smoker    Packs/day: 0.25    Years: 4.00    Types: Cigarettes  . Smokeless tobacco: Never Used  . Alcohol use 1.2 oz/week    2 Cans of beer per week     Comment: Daily. Smells ETOH.      Allergies   Haldol [haloperidol]   Review of Systems Review of Systems Review of Systems All other systems negative except as documented in the HPI. All pertinent positives and negatives as reviewed in the HPI.   Physical Exam Updated Vital Signs BP 107/62   Pulse 93   Temp 98.1 F (36.7 C) (Oral)   Resp 19   Ht 5\' 10"  (1.778 m)   Wt 99.8 kg   SpO2 98%   BMI 31.57 kg/m   Physical Exam  Constitutional: He appears well-developed and  well-nourished. No distress.  HENT:  Head: Normocephalic and atraumatic.  Eyes: Pupils are equal, round, and reactive to light.  Neck: Normal range of motion. Neck supple.  Cardiovascular: Normal rate and regular rhythm.   Pulmonary/Chest: Effort normal.  Abdominal: Soft.  Musculoskeletal:       Left shoulder: He exhibits pain. He exhibits normal range of motion, no tenderness, no bony tenderness, no swelling, no effusion, no crepitus, no deformity, no laceration, no spasm, normal pulse and normal strength.  Neurological: He is alert.  Skin: Skin is warm and dry.  Nursing note and vitals reviewed.    ED Treatments / Results  Labs (all labs ordered are listed, but only abnormal  results are displayed) Labs Reviewed - No data to display  EKG  EKG Interpretation None       Radiology Dg Shoulder Left  Result Date: 11/02/2016 CLINICAL DATA:  Pain after straining his shoulder yesterday EXAM: LEFT SHOULDER - 2+ VIEW COMPARISON:  None. FINDINGS: There is no evidence of fracture or dislocation. There is no evidence of arthropathy or other focal bone abnormality. Soft tissues are unremarkable. IMPRESSION: Negative. Electronically Signed   By: Ellery Plunkaniel R Mitchell M.D.   On: 11/02/2016 02:56    Procedures Procedures (including critical care time)  Medications Ordered in ED Medications  acetaminophen (TYLENOL) tablet 650 mg (not administered)     Initial Impression / Assessment and Plan / ED Course  I have reviewed the triage vital signs and the nursing notes.  Pertinent labs & imaging results that were available during my care of the patient were reviewed by me and considered in my medical decision making (see chart for details).  Clinical Course     Patient says he is no longer concerned about his shoulder but would like a sandwich. Declined shoulder sling. Given a dose of Tylenol. F./u with UC as needed, likely he mildly sprained his shoulder wrestling.   Final Clinical Impressions(s) / ED Diagnoses   Final diagnoses:  Acute pain of left shoulder    New Prescriptions New Prescriptions   No medications on file     Marlon Peliffany Avalee Castrellon, Cordelia Poche-C 11/02/16 [id number]    Cole Batonourtney F Horton, MD 11/06/16 2258

## 2016-11-03 ENCOUNTER — Emergency Department (HOSPITAL_COMMUNITY)
Admission: EM | Admit: 2016-11-03 | Discharge: 2016-11-03 | Disposition: A | Discharge status: Home / Self Care | Payer: Medicaid Other | Attending: Emergency Medicine | Admitting: Emergency Medicine

## 2016-11-03 ENCOUNTER — Encounter (HOSPITAL_COMMUNITY): Payer: Self-pay | Admitting: *Deleted

## 2016-11-03 DIAGNOSIS — F17210 Nicotine dependence, cigarettes, uncomplicated: Secondary | ICD-10-CM | POA: Diagnosis not present

## 2016-11-03 DIAGNOSIS — Z7901 Long term (current) use of anticoagulants: Secondary | ICD-10-CM | POA: Insufficient documentation

## 2016-11-03 DIAGNOSIS — Y92410 Unspecified street and highway as the place of occurrence of the external cause: Secondary | ICD-10-CM | POA: Insufficient documentation

## 2016-11-03 DIAGNOSIS — X58XXXA Exposure to other specified factors, initial encounter: Secondary | ICD-10-CM | POA: Diagnosis not present

## 2016-11-03 DIAGNOSIS — Y999 Unspecified external cause status: Secondary | ICD-10-CM | POA: Insufficient documentation

## 2016-11-03 DIAGNOSIS — Y9372 Activity, wrestling: Secondary | ICD-10-CM | POA: Diagnosis not present

## 2016-11-03 DIAGNOSIS — M25512 Pain in left shoulder: Secondary | ICD-10-CM | POA: Diagnosis not present

## 2016-11-03 MED ORDER — RIVAROXABAN 20 MG PO TABS
20.0000 mg | ORAL_TABLET | Freq: Once | ORAL | Status: AC
  Administered 2016-11-03: 20 mg via ORAL
  Filled 2016-11-03: qty 1

## 2016-11-03 MED ORDER — ACETAMINOPHEN 325 MG PO TABS
650.0000 mg | ORAL_TABLET | Freq: Once | ORAL | Status: AC
  Administered 2016-11-03: 650 mg via ORAL
  Filled 2016-11-03: qty 2

## 2016-11-03 MED ORDER — RIVAROXABAN 20 MG PO TABS: 20.0000 mg | ORAL_TABLET | Freq: Every day | ORAL | 1 refills | Status: DC

## 2016-11-03 MED ORDER — ACETAMINOPHEN 500 MG PO TABS: 500.0000 mg | ORAL_TABLET | Freq: Four times a day (QID) | ORAL | 0 refills | Status: DC | PRN

## 2016-11-03 NOTE — ED Provider Notes (Signed)
[facility]-EMERGENCY DEPT Provider Note   CSN: [id number] Arrival date & time: 11/03/16  0135     History   Chief Complaint Chief Complaint  Patient presents with  . Arm Pain    HPI Cole Barrett is a 30 y.o. male.  30 year old male with a history of homelessness, paranoid schizophrenia, and pulmonary embolus (on chronic Xarelto), presents to the emergency department for complaints of left shoulder pain. He was seen yesterday for similar symptoms. He reports onset of pain 2 days ago after "wrestling in the streets". Patient states that he has not taken anything for symptoms. He is requesting pain medicine. He has not had any limited range of motion, numbness, or arm weakness.      Past Medical History:  Diagnosis Date  . Homelessness   . Hypercholesterolemia   . Mental disorder   . Paranoid behavior (HCC)   . PE (pulmonary embolism)   . Schizophrenia [facility](HCC)     Patient Active Problem List   Diagnosis Date Noted  . Alcohol abuse   . Auditory hallucination   . 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 Adjustment disorder with depressed mood 09/07/2016  . Homelessness 09/03/2016  . Person feigning illness 09/03/2016  . 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 Cannabis use disorder, mild, abuse 08/29/2016  . Paranoid schizophrenia (HCC) 08/26/2016  . Pulmonary emboli (HCC) 07/13/2016  . 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 Acute left flank pain 01/02/2014  . Pulmonary embolism, bilateral (HCC) 01/02/2014    History reviewed. No pertinent surgical history.     Home Medications    Prior to Admission medications   Medication Sig Start Date End Date Taking? Authorizing Provider  acetaminophen (TYLENOL) 500 MG tablet Take 1 tablet (500 mg total) by mouth every 6 (six) hours as needed. 11/03/16   Antony MaduraKelly Ashley Bultema, PA-C  benztropine (COGENTIN) 0.5 MG tablet Take 1 tablet (0.5 mg total) by mouth 2 (two) times daily. 10/30/16   Azalia BilisKevin Campos, MD    pravastatin (PRAVACHOL) 20 MG tablet Take 20 mg by mouth at bedtime.    Historical Provider, MD  pravastatin (PRAVACHOL) 20 MG tablet Take 1 tablet (20 mg total) by mouth daily. 09/25/16   Tiffany Neva SeatGreene, PA-C  risperiDONE (RISPERDAL) 1 MG tablet Take 1 tablet (1 mg total) by mouth every morning. 10/30/16   Azalia BilisKevin Campos, MD  risperiDONE (RISPERDAL) 3 MG tablet Take 1 tablet (3 mg total) by mouth at bedtime. 10/30/16   Azalia BilisKevin Campos, MD  rivaroxaban (XARELTO) 20 MG TABS tablet Take 1 tablet (20 mg total) by mouth daily with supper. 11/03/16   Antony MaduraKelly Kris Burd, PA-C  traZODone (DESYREL) 50 MG tablet Take 1-2 tablets (50-100 mg total) by mouth at bedtime as needed for sleep. 10/30/16   Azalia BilisKevin Campos, MD    Family History No family history on file.  Social History Social History  Substance Use Topics  . Smoking status: Current Every Day Smoker    Packs/day: 0.25    Years: 4.00    Types: Cigarettes  . Smokeless tobacco: Never Used  . Alcohol use 1.2 oz/week    2 Cans of beer per week     Comment: Daily. Smells ETOH.      Allergies   Haldol [haloperidol]   Review of Systems Review of Systems Ten systems reviewed and are negative for acute change, except as noted in the HPI.    Physical Exam Updated Vital Signs BP 124/76 (BP Location: Right Arm)   Pulse 98  Temp 98 F (36.7 C) (Oral)   Resp 16   SpO2 98%   Physical Exam  Constitutional: He is oriented to person, place, and time. He appears well-developed and well-nourished. No distress.  Nontoxic and in no distress  HENT:  Head: Normocephalic and atraumatic.  Eyes: Conjunctivae and EOM are normal. No scleral icterus.  Neck: Normal range of motion.  Cardiovascular: Normal rate, regular rhythm and intact distal pulses.   Pulmonary/Chest: Effort normal. No respiratory distress.  Respirations even and unlabored  Musculoskeletal: Normal range of motion. He exhibits tenderness (minimal anterior L shoulder TTP). He exhibits no  deformity.  Preserved range of motion of the left upper extremity. No crepitus or deformity. No swelling, effusion, or abrasion. No contusion.  Neurological: He is alert and oriented to person, place, and time. He exhibits normal muscle tone. Coordination normal.  Sensation to light touch intact in the left upper extremity. Grip strength 5/5.  Skin: Skin is warm and dry. No rash noted. He is not diaphoretic. No erythema. No pallor.  Psychiatric: He has a normal mood and affect. His behavior is normal.  Nursing note and vitals reviewed.    ED Treatments / Results  Labs (all labs ordered are listed, but only abnormal results are displayed) Labs Reviewed - No data to display  EKG  EKG Interpretation None       Radiology Dg Shoulder Left  Result Date: 11/02/2016 CLINICAL DATA:  Pain after straining his shoulder yesterday EXAM: LEFT SHOULDER - 2+ VIEW COMPARISON:  None. FINDINGS: There is no evidence of fracture or dislocation. There is no evidence of arthropathy or other focal bone abnormality. Soft tissues are unremarkable. IMPRESSION: Negative. Electronically Signed   By: Ellery Plunkaniel R Mitchell M.D.   On: 11/02/2016 02:56    Procedures Procedures (including critical care time)  Medications Ordered in ED Medications  rivaroxaban (XARELTO) tablet 20 mg (20 mg Oral Given 11/03/16 0337)  acetaminophen (TYLENOL) tablet 650 mg (650 mg Oral Given 11/03/16 [id number])     Initial Impression / Assessment and Plan / ED Course  I have reviewed the triage vital signs and the nursing notes.  Pertinent labs & imaging results that were available during my care of the patient were reviewed by me and considered in my medical decision making (see chart for details).  Clinical Course     30 year old male percent to the emergency department for complaints of left shoulder pain. [facility]. He is neurovascularly intact today. Imaging from yesterday reviewed;  negative for fracture. Patient given dose of Tylenol. Prescription provided at discharge for this. Patient was also given a refill of his Xarelto which he has not had over the past few days. No hypoxia, tachycardia, or signs of respiratory distress on exam. The patient is stable for follow-up with a primary care doctor. Return precautions given at discharge. Patient discharged in stable condition.   Final Clinical Impressions(s) / ED Diagnoses   Final diagnoses:  Left shoulder pain, unspecified chronicity    New Prescriptions Discharge Medication List as of 11/03/2016  2:54 AM    START taking these medications   Details  acetaminophen (TYLENOL) 500 MG tablet Take 1 tablet (500 mg total) by mouth every 6 (six) hours as needed., Starting Thu 11/03/2016, Print         Walton ParkKelly Princess Karnes, PA-C 11/03/16 0540    April Palumbo, MD 11/03/16 [id number]

## 2016-11-03 NOTE — ED Triage Notes (Signed)
Pt c/o left arm pain, sts wants it checked out, [facility] for same

## 2016-11-10 ENCOUNTER — Emergency Department (HOSPITAL_COMMUNITY)
Admission: EM | Admit: 2016-11-10 | Discharge: 2016-11-11 | Disposition: A | Discharge status: Home / Self Care | Payer: Medicaid Other | Attending: Emergency Medicine | Admitting: Emergency Medicine

## 2016-11-10 ENCOUNTER — Encounter (HOSPITAL_COMMUNITY): Payer: Self-pay | Admitting: Emergency Medicine

## 2016-11-10 DIAGNOSIS — Z7901 Long term (current) use of anticoagulants: Secondary | ICD-10-CM | POA: Diagnosis not present

## 2016-11-10 DIAGNOSIS — R440 Auditory hallucinations: Secondary | ICD-10-CM

## 2016-11-10 DIAGNOSIS — Z79899 Other long term (current) drug therapy: Secondary | ICD-10-CM | POA: Diagnosis not present

## 2016-11-10 DIAGNOSIS — F17210 Nicotine dependence, cigarettes, uncomplicated: Secondary | ICD-10-CM | POA: Diagnosis not present

## 2016-11-10 LAB — COMPREHENSIVE METABOLIC PANEL
ALBUMIN: 3.6 g/dL (ref 3.5–5.0)
ALK PHOS: 48 U/L (ref 38–126)
ALT: 34 U/L (ref 17–63)
ANION GAP: 10 (ref 5–15)
AST: 34 U/L (ref 15–41)
BUN: 14 mg/dL (ref 6–20)
CALCIUM: 8.5 mg/dL — AB (ref 8.9–10.3)
CHLORIDE: 106 mmol/L (ref 101–111)
CO2: 22 mmol/L (ref 22–32)
Creatinine, Ser: 1.32 mg/dL — ABNORMAL HIGH (ref 0.61–1.24)
GFR calc non Af Amer: >60 mL/min (ref >60)
GLUCOSE: 146 mg/dL — AB (ref 65–99)
POTASSIUM: 3.6 mmol/L (ref 3.5–5.1)
SODIUM: 138 mmol/L (ref 135–145)
Total Bilirubin: 0.2 mg/dL — ABNORMAL LOW (ref 0.3–1.2)
Total Protein: 6.2 g/dL — ABNORMAL LOW (ref 6.5–8.1)

## 2016-11-10 LAB — RAPID URINE DRUG SCREEN, HOSP PERFORMED
AMPHETAMINES: NOT DETECTED
BENZODIAZEPINES: NOT DETECTED
Barbiturates: NOT DETECTED
Cocaine: NOT DETECTED
Opiates: NOT DETECTED
TETRAHYDROCANNABINOL: NOT DETECTED

## 2016-11-10 LAB — CBC WITH DIFFERENTIAL/PLATELET
BASOS PCT: 0 %
Basophils Absolute: 0 10*3/uL (ref 0.0–0.1)
EOS ABS: 0.2 10*3/uL (ref 0.0–0.7)
EOS PCT: 4 %
HCT: 35.2 % — ABNORMAL LOW (ref 39.0–52.0)
HEMOGLOBIN: 11.4 g/dL — AB (ref 13.0–17.0)
Lymphocytes Relative: 37 %
Lymphs Abs: 1.8 10*3/uL (ref 0.7–4.0)
MCH: 27.1 pg (ref 26.0–34.0)
MCHC: 32.4 g/dL (ref 30.0–36.0)
MCV: 83.6 fL (ref 78.0–100.0)
MONOS PCT: 6 %
Monocytes Absolute: 0.3 10*3/uL (ref 0.1–1.0)
NEUTROS PCT: 53 %
Neutro Abs: 2.6 10*3/uL (ref 1.7–7.7)
PLATELETS: 287 10*3/uL (ref 150–400)
RBC: 4.21 MIL/uL — AB (ref 4.22–5.81)
RDW: 14.3 % (ref 11.5–15.5)
WBC: 4.8 10*3/uL (ref 4.0–10.5)

## 2016-11-10 LAB — ETHANOL: Alcohol, Ethyl (B): <5 mg/dL (ref <5)

## 2016-11-10 NOTE — ED Triage Notes (Signed)
Pt. requesting psychiatric treatment for his auditory hallucinations onset this week , denies suicidal ideation .

## 2016-11-11 MED ORDER — TRAZODONE HCL 50 MG PO TABS: 50.0000 mg | ORAL_TABLET | Freq: Every evening | ORAL | 0 refills | Status: DC | PRN

## 2016-11-11 MED ORDER — RISPERIDONE 1 MG PO TABS: 1.0000 mg | ORAL_TABLET | Freq: Every morning | ORAL | 0 refills | Status: DC

## 2016-11-11 MED ORDER — BENZTROPINE MESYLATE 0.5 MG PO TABS: 0.5000 mg | ORAL_TABLET | Freq: Two times a day (BID) | ORAL | 0 refills | Status: DC

## 2016-11-11 MED ORDER — RISPERIDONE 3 MG PO TABS: 3.0000 mg | ORAL_TABLET | Freq: Every day | ORAL | 0 refills | Status: DC

## 2016-11-11 NOTE — ED Provider Notes (Signed)
[facility]-EMERGENCY DEPT Provider Note   CSN: [id number] Arrival date & time: 11/10/16  2039  By signing my name below, I, Majel HomerPeyton Lee, attest that this documentation has been prepared under the direction and in the presence of Gilda Creasehristopher J Janesha Brissette, MD . Electronically Signed: Majel HomerPeyton Lee, Scribe. 11/11/2016. 12:44 AM.  History   Chief Complaint Chief Complaint  Patient presents with  . Hallucinations   The history is provided by the patient. No language interpreter was used.   HPI Comments: Cole Barrett is a 30 y.o. male with PMHx of homelessness and schizophrenia, who presents to the Emergency Department for an evaluation of auditory hallucinations that began ~6 months ago. Pt reports "he hears voices off and on" that say "they are going to kill him." He states he lost his prescription medication for his schizophrenia and "does not know how to go about getting them" which is why he visited the ED tonight. He denies suicidal ideation.   Past Medical History:  Diagnosis Date  . Homelessness   . Hypercholesterolemia   . Mental disorder   . Paranoid behavior (HCC)   . PE (pulmonary embolism)   . Schizophrenia [facility](HCC)     Patient Active Problem List   Diagnosis Date Noted  . Alcohol abuse   . Auditory hallucination   . 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 Adjustment disorder with depressed mood 09/07/2016  . Homelessness 09/03/2016  . Person feigning illness 09/03/2016  . 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 Cannabis use disorder, mild, abuse 08/29/2016  . Paranoid schizophrenia (HCC) 08/26/2016  . Pulmonary emboli (HCC) 07/13/2016  . 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 Acute left flank pain 01/02/2014  . Pulmonary embolism, bilateral (HCC) 01/02/2014    History reviewed. No pertinent surgical history.     Home Medications    Prior to Admission medications   Medication Sig Start Date End Date Taking? Authorizing Provider    acetaminophen (TYLENOL) 500 MG tablet Take 1 tablet (500 mg total) by mouth every 6 (six) hours as needed. 11/03/16   Antony MaduraKelly Humes, PA-C  benztropine (COGENTIN) 0.5 MG tablet Take 1 tablet (0.5 mg total) by mouth 2 (two) times daily. 11/11/16   Gilda Creasehristopher J Toba Claudio, MD  pravastatin (PRAVACHOL) 20 MG tablet Take 20 mg by mouth at bedtime.    Historical Provider, MD  pravastatin (PRAVACHOL) 20 MG tablet Take 1 tablet (20 mg total) by mouth daily. 09/25/16   Tiffany Neva SeatGreene, PA-C  risperiDONE (RISPERDAL) 1 MG tablet Take 1 tablet (1 mg total) by mouth every morning. 11/11/16   Gilda Creasehristopher J Alaia Lordi, MD  risperiDONE (RISPERDAL) 3 MG tablet Take 1 tablet (3 mg total) by mouth at bedtime. 11/11/16   Gilda Creasehristopher J Talah Cookston, MD  rivaroxaban (XARELTO) 20 MG TABS tablet Take 1 tablet (20 mg total) by mouth daily with supper. 11/03/16   Antony MaduraKelly Humes, PA-C  traZODone (DESYREL) 50 MG tablet Take 1-2 tablets (50-100 mg total) by mouth at bedtime as needed for sleep. 11/11/16   Gilda Creasehristopher J Germany Chelf, MD    Family History No family history on file.  Social History Social History  Substance Use Topics  . Smoking status: Current Every Day Smoker    Packs/day: 0.25    Years: 4.00    Types: Cigarettes  . Smokeless tobacco: Never Used  . Alcohol use 1.2 oz/week    2 Cans of beer per week     Comment: Daily. Smells ETOH.      Allergies   Haldol [  haloperidol]   Review of Systems Review of Systems  Constitutional: Negative for fever.  Psychiatric/Behavioral: Positive for hallucinations. Negative for suicidal ideas.   Physical Exam Updated Vital Signs BP 122/69 (BP Location: Right Arm)   Pulse 82   Temp 98.3 F (36.8 C) (Oral)   Resp 22   SpO2 97%   Physical Exam  Constitutional: He is oriented to person, place, and time. He appears well-developed and well-nourished. No distress.  HENT:  Head: Normocephalic and atraumatic.  Right Ear: Hearing normal.  Left Ear: Hearing normal.  Nose: Nose  normal.  Mouth/Throat: Oropharynx is clear and moist and mucous membranes are normal.  Eyes: Conjunctivae and EOM are normal. Pupils are equal, round, and reactive to light.  Neck: Normal range of motion. Neck supple.  Cardiovascular: Regular rhythm, S1 normal and S2 normal.  Exam reveals no gallop and no friction rub.   No murmur heard. Pulmonary/Chest: Effort normal and breath sounds normal. No respiratory distress. He exhibits no tenderness.  Abdominal: Soft. Normal appearance and bowel sounds are normal. There is no hepatosplenomegaly. There is no tenderness. There is no rebound, no guarding, no tenderness at McBurney's point and negative Murphy's sign. No hernia.  Musculoskeletal: Normal range of motion.  Neurological: He is alert and oriented to person, place, and time. He has normal strength. No cranial nerve deficit or sensory deficit. Coordination normal. GCS eye subscore is 4. GCS verbal subscore is 5. GCS motor subscore is 6.  Skin: Skin is warm, dry and intact. No rash noted. No cyanosis.  Psychiatric: His speech is normal.  Nursing note and vitals reviewed.    ED Treatments / Results  Labs (all labs ordered are listed, but only abnormal results are displayed) Labs Reviewed  CBC WITH DIFFERENTIAL/PLATELET - Abnormal; Notable for the following:       Result Value   RBC 4.21 (*)    Hemoglobin 11.4 (*)    HCT 35.2 (*)    All other components within normal limits  COMPREHENSIVE METABOLIC PANEL - Abnormal; Notable for the following:    Glucose, Bld 146 (*)    Creatinine, Ser 1.32 (*)    Calcium 8.5 (*)    Total Protein 6.2 (*)    Total Bilirubin 0.2 (*)    All other components within normal limits  ETHANOL  RAPID URINE DRUG SCREEN, HOSP PERFORMED    EKG  EKG Interpretation  Date/Time:  Friday November 11 2016 00:06:38 EST Ventricular Rate:  85 PR Interval:    QRS Duration: 89 QT Interval:  339 QTC Calculation: 403 R Axis:   53 Text Interpretation:  Sinus rhythm  Borderline repolarization abnormality No significant change since last tracing Confirmed by Rida Loudin  MD, Orazio ([id number]) on 11/11/2016 1:53:35 AM       Radiology No results found.  Procedures Procedures (including critical care time)  Medications Ordered in ED Medications - No data to display  DIAGNOSTIC STUDIES:  Oxygen Saturation is 97% on RA, normal by my interpretation.    COORDINATION OF CARE:  12:33 AM Discussed treatment plan with pt at bedside and pt agreed to plan.  Initial Impression / Assessment and Plan / ED Course  I have reviewed the triage vital signs and the nursing notes.  Pertinent labs & imaging results that were available during my care of the patient were reviewed by me and considered in my medical decision making (see chart for details).  Clinical Course    Patient presents to the ER for evaluation of  hallucinations. Patient reports that he has been hearing voices for months. He also reports that he has been off his medications since the summer. He reports that the voices put him down and sometimes say that they're going to hurt him, but he is not receiving commands to hurt himself or others. He denies homicidality and suicidality. He was seen with similar presentation earlier in the month and given prescriptions for his psychiatric meds, but lost them. Medications will be prescribed once again and he can follow-up at [facility].   Final Clinical Impressions(s) / ED Diagnoses   Final diagnoses:  Auditory hallucination    New Prescriptions Current Discharge Medication List     I personally performed the services described in this documentation, which was scribed in my presence. The recorded information has been reviewed and is accurate.     Gilda Crease, MD 11/11/16 [phone]

## 2016-11-11 NOTE — ED Notes (Addendum)
Pt states he was here 2 weeks ago and lost his paperwork. Pt states he needs prescriptions refilled from 2 weeks ago and also wants to talk to a doctor about hearing voices in his head.  Pt states he has been hearing voices in his head for over a year. Pt report previous hospitalization for same.

## 2016-11-12 ENCOUNTER — Encounter (HOSPITAL_COMMUNITY): Payer: Self-pay | Admitting: *Deleted

## 2016-11-12 ENCOUNTER — Emergency Department (HOSPITAL_COMMUNITY)
Admission: EM | Admit: 2016-11-12 | Discharge: 2016-11-12 | Disposition: A | Discharge status: Home / Self Care | Payer: Medicaid Other | Attending: Emergency Medicine | Admitting: Emergency Medicine

## 2016-11-12 DIAGNOSIS — M25511 Pain in right shoulder: Secondary | ICD-10-CM | POA: Insufficient documentation

## 2016-11-12 DIAGNOSIS — G8929 Other chronic pain: Secondary | ICD-10-CM | POA: Diagnosis not present

## 2016-11-12 DIAGNOSIS — F17210 Nicotine dependence, cigarettes, uncomplicated: Secondary | ICD-10-CM | POA: Diagnosis not present

## 2016-11-12 MED ORDER — DICLOFENAC SODIUM 1 % TD GEL
2.0000 g | Freq: Four times a day (QID) | TRANSDERMAL | Status: DC | PRN
  Filled 2016-11-12: qty 100

## 2016-11-12 NOTE — Discharge Instructions (Signed)
Use voltaren gel, 2g every 6 hours, on your right shoulder; massage into the joint for pain relief. Apply ice 3-4 times per day. Follow up with an orthopedist.

## 2016-11-12 NOTE — ED Triage Notes (Signed)
Pt reports being seen for same last week, states pain med given to him is not working.  Pt able to move his arm without difficulty.

## 2016-11-12 NOTE — ED Provider Notes (Signed)
[facility]-EMERGENCY DEPT Provider Note   CSN: [id number] Arrival date & time: 11/12/16  0023 By signing my name below, I, Linus GalasMaharshi Patel, attest that this documentation has been prepared under the direction and in the presence of TRW AutomotiveKelly Roniel Halloran, PA-C. Electronically Signed: Linus GalasMaharshi Patel, ED Scribe. 11/12/16. 1:11 AM.   History   Chief Complaint Chief Complaint  Patient presents with  . Arm Pain   The history is provided by the patient. No language interpreter was used.   HPI Comments: Cole Barrett is a 30 y.o. male who presents to the Emergency Department with a PMHx of schizophrenia and PE complaining of right shoulder pain that began 2 days ago. Pt states he injured his shoulder during the summer of 2017. Pt denies any aggravating or alleviating factors. Pt states his pain is better with "pain pills" but his last prescription was not providing any relief. He ran out of his medication 3 days ago and is demanding for pain medication while in the ED. Pt denies any fevers, chills, CP, SOB, N/V/D or any other symptoms at this time.  Pt denies any recent heavy lifting, falls or injuries.    Past Medical History:  Diagnosis Date  . Homelessness   . Hypercholesterolemia   . Mental disorder   . Paranoid behavior (HCC)   . PE (pulmonary embolism)   . Schizophrenia [facility](HCC)    Patient Active Problem List   Diagnosis Date Noted  . Alcohol abuse   . Auditory hallucination   . 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 Adjustment disorder with depressed mood 09/07/2016  . Homelessness 09/03/2016  . Person feigning illness 09/03/2016  . 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 Cannabis use disorder, mild, abuse 08/29/2016  . Paranoid schizophrenia (HCC) 08/26/2016  . Pulmonary emboli (HCC) 07/13/2016  . 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 Acute left flank pain 01/02/2014  . Pulmonary embolism, bilateral (HCC) 01/02/2014   History reviewed. No pertinent  surgical history.   Home Medications    Prior to Admission medications   Medication Sig Start Date End Date Taking? Authorizing Provider  acetaminophen (TYLENOL) 500 MG tablet Take 1 tablet (500 mg total) by mouth every 6 (six) hours as needed. 11/03/16   Antony MaduraKelly Davanta Meuser, PA-C  benztropine (COGENTIN) 0.5 MG tablet Take 1 tablet (0.5 mg total) by mouth 2 (two) times daily. 11/11/16   Gilda Creasehristopher J Pollina, MD  pravastatin (PRAVACHOL) 20 MG tablet Take 20 mg by mouth at bedtime.    Historical Provider, MD  pravastatin (PRAVACHOL) 20 MG tablet Take 1 tablet (20 mg total) by mouth daily. 09/25/16   Tiffany Neva SeatGreene, PA-C  risperiDONE (RISPERDAL) 1 MG tablet Take 1 tablet (1 mg total) by mouth every morning. 11/11/16   Gilda Creasehristopher J Pollina, MD  risperiDONE (RISPERDAL) 3 MG tablet Take 1 tablet (3 mg total) by mouth at bedtime. 11/11/16   Gilda Creasehristopher J Pollina, MD  rivaroxaban (XARELTO) 20 MG TABS tablet Take 1 tablet (20 mg total) by mouth daily with supper. 11/03/16   Antony MaduraKelly Gaile Allmon, PA-C  traZODone (DESYREL) 50 MG tablet Take 1-2 tablets (50-100 mg total) by mouth at bedtime as needed for sleep. 11/11/16   Gilda Creasehristopher J Pollina, MD    Family History No family history on file.  Social History Social History  Substance Use Topics  . Smoking status: Current Every Day Smoker    Packs/day: 0.25    Years: 4.00    Types: Cigarettes  . Smokeless tobacco: Never Used  . Alcohol use  1.2 oz/week    2 Cans of beer per week     Comment: Daily. Smells ETOH.      Allergies   Haldol [haloperidol]   Review of Systems Review of Systems A complete 10 system review of systems was obtained and all systems are negative except as noted in the HPI and PMH.    Physical Exam Updated Vital Signs BP 113/65 (BP Location: Right Arm)   Pulse 82   Temp 97.7 F (36.5 C) (Oral)   Resp 20   Ht 5\' 10"  (1.778 m)   Wt 90.7 kg   SpO2 100%   BMI 28.70 kg/m   Physical Exam  Constitutional: He is oriented to  person, place, and time. He appears well-developed and well-nourished. No distress.  HENT:  Head: Normocephalic and atraumatic.  Eyes: Conjunctivae and EOM are normal. No scleral icterus.  Neck: Normal range of motion.  Cardiovascular: Normal rate, regular rhythm and intact distal pulses.   Distal radial pulse 2+ in the RUE  Pulmonary/Chest: Effort normal. No respiratory distress.  Musculoskeletal: Normal range of motion.       Right shoulder: Normal. He exhibits normal range of motion, no bony tenderness, no swelling, no effusion, no crepitus and no spasm.  Neurological: He is alert and oriented to person, place, and time. He exhibits normal muscle tone. Coordination normal.  Sensation to light touch intact in the right upper extremity. Grip strength 5/5. Normal strength against resistance in the right upper extremity.  Skin: Skin is warm and dry. No rash noted. He is not diaphoretic. No erythema. No pallor.  Psychiatric: He has a normal mood and affect. His behavior is normal.  Nursing note and vitals reviewed.    ED Treatments / Results  DIAGNOSTIC STUDIES: Oxygen Saturation is 100% on room air, normal by my interpretation.    COORDINATION OF CARE: 3:34 AM Discussed treatment plan with pt at bedside and pt agreed to plan.  Labs (all labs ordered are listed, but only abnormal results are displayed) Labs Reviewed - No data to display  EKG  EKG Interpretation None       Radiology No results found.  Procedures Procedures (including critical care time)  Medications Ordered in ED Medications  diclofenac sodium (VOLTAREN) 1 % transdermal gel 2 g (not administered)     Initial Impression / Assessment and Plan / ED Course  I have reviewed the triage vital signs and the nursing notes.  Pertinent labs & imaging results that were available during my care of the patient were reviewed by me and considered in my medical decision making (see chart for details).  Clinical  Course     30 year old male with a history of homelessness presents to the emergency department for complaints of right shoulder pain. He has been seen for this in the past and denies any new injury. Patient neurovascularly intact with preserved range of motion. He is requesting additional medication as he states Tylenol does not work for him. Patient provided Voltaren gel in the emergency department for topical application. He has been referred to an orthopedist. Return precautions discussed and provided. Patient discharged in stable condition. No indication for further emergent workup.   Final Clinical Impressions(s) / ED Diagnoses   Final diagnoses:  Chronic right shoulder pain    New Prescriptions Discharge Medication List as of 11/12/2016  1:16 AM      I personally performed the services described in this documentation, which was scribed in my presence. The recorded information  has been reviewed and is accurate.       [name], PA-C 11/12/16 1610    Mancel Bale, MD 11/12/16 [phone]

## 2016-11-16 ENCOUNTER — Emergency Department (HOSPITAL_COMMUNITY)
Admission: EM | Admit: 2016-11-16 | Discharge: 2016-11-16 | Disposition: A | Discharge status: Home / Self Care | Payer: Medicaid Other | Attending: Emergency Medicine | Admitting: Emergency Medicine

## 2016-11-16 ENCOUNTER — Encounter (HOSPITAL_COMMUNITY): Payer: Self-pay | Admitting: Emergency Medicine

## 2016-11-16 DIAGNOSIS — Z7901 Long term (current) use of anticoagulants: Secondary | ICD-10-CM | POA: Diagnosis not present

## 2016-11-16 DIAGNOSIS — F209 Schizophrenia, unspecified: Secondary | ICD-10-CM | POA: Diagnosis not present

## 2016-11-16 DIAGNOSIS — F17210 Nicotine dependence, cigarettes, uncomplicated: Secondary | ICD-10-CM | POA: Diagnosis not present

## 2016-11-16 DIAGNOSIS — R440 Auditory hallucinations: Secondary | ICD-10-CM

## 2016-11-16 MED ORDER — RISPERIDONE 1 MG PO TABS: 1.0000 mg | ORAL_TABLET | Freq: Every morning | ORAL | 0 refills | Status: DC

## 2016-11-16 MED ORDER — RISPERIDONE 3 MG PO TABS: 3.0000 mg | ORAL_TABLET | Freq: Every day | ORAL | 0 refills | Status: DC

## 2016-11-16 MED ORDER — TRAZODONE HCL 50 MG PO TABS: 50.0000 mg | ORAL_TABLET | Freq: Every evening | ORAL | 0 refills | Status: DC | PRN

## 2016-11-16 MED ORDER — BENZTROPINE MESYLATE 0.5 MG PO TABS: 0.5000 mg | ORAL_TABLET | Freq: Two times a day (BID) | ORAL | 0 refills | Status: DC

## 2016-11-16 NOTE — ED Provider Notes (Signed)
[facility]-EMERGENCY DEPT Provider Note   CSN: [id number] Arrival date & time: 11/16/16  0212     History   Chief Complaint Chief Complaint  Patient presents with  . Hallucinations    HPI Cole Barrett is a 30 y.o. male.  Patient with long-standing schizophrenia and auditory hallucinations comes in stating that he is having ongoing problems with auditory hallucinations. He appears worse as which says that they are going to kill him, but he denies any suicidal ideation or homicidal ideation. He has been going to [facility] and he states that they have not been helping him. However, he also states that he has not taken any of his medications in the last 2 weeks because he ran out. He is requesting inpatient admission [facility].   The history is provided by the patient.    Past Medical History:  Diagnosis Date  . Homelessness   . Hypercholesterolemia   . Mental disorder   . Paranoid behavior (HCC)   . PE (pulmonary embolism)   . Schizophrenia [facility](HCC)     Patient Active Problem List   Diagnosis Date Noted  . Alcohol abuse   . Auditory hallucination   . 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 Adjustment disorder with depressed mood 09/07/2016  . Homelessness 09/03/2016  . Person feigning illness 09/03/2016  . 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 Cannabis use disorder, mild, abuse 08/29/2016  . Paranoid schizophrenia (HCC) 08/26/2016  . Pulmonary emboli (HCC) 07/13/2016  . 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 Acute left flank pain 01/02/2014  . Pulmonary embolism, bilateral (HCC) 01/02/2014    History reviewed. No pertinent surgical history.     Home Medications    Prior to Admission medications   Medication Sig Start Date End Date Taking? Authorizing Provider  acetaminophen (TYLENOL) 500 MG tablet Take 1 tablet (500 mg total) by mouth every 6 (six) hours as needed. 11/03/16   Antony MaduraKelly Humes, PA-C  benztropine  (COGENTIN) 0.5 MG tablet Take 1 tablet (0.5 mg total) by mouth 2 (two) times daily. 11/16/16   Dione Boozeavid Aidenjames Heckmann, MD  pravastatin (PRAVACHOL) 20 MG tablet Take 20 mg by mouth at bedtime.    Historical Provider, MD  pravastatin (PRAVACHOL) 20 MG tablet Take 1 tablet (20 mg total) by mouth daily. 09/25/16   Tiffany Neva SeatGreene, PA-C  risperiDONE (RISPERDAL) 1 MG tablet Take 1 tablet (1 mg total) by mouth every morning. 11/16/16   Dione Boozeavid Burnett Spray, MD  risperiDONE (RISPERDAL) 3 MG tablet Take 1 tablet (3 mg total) by mouth at bedtime. 11/16/16   Dione Boozeavid Kamali Sakata, MD  rivaroxaban (XARELTO) 20 MG TABS tablet Take 1 tablet (20 mg total) by mouth daily with supper. 11/03/16   Antony MaduraKelly Humes, PA-C  traZODone (DESYREL) 50 MG tablet Take 1-2 tablets (50-100 mg total) by mouth at bedtime as needed for sleep. 11/16/16   Dione Boozeavid Eydan Chianese, MD    Family History No family history on file.  Social History Social History  Substance Use Topics  . Smoking status: Current Every Day Smoker    Packs/day: 0.25    Years: 4.00    Types: Cigarettes  . Smokeless tobacco: Never Used  . Alcohol use 1.2 oz/week    2 Cans of beer per week     Comment: Daily. Smells ETOH.      Allergies   Haldol [haloperidol]   Review of Systems Review of Systems  All other systems reviewed and are negative.    Physical Exam Updated Vital  Signs BP 116/67 (BP Location: Right Arm)   Pulse 84   Temp 98.4 F (36.9 C) (Oral)   Resp 14   SpO2 99%   Physical Exam  Nursing note and vitals reviewed.  30 year old male, resting comfortably and in no acute distress. Vital signs are normal. Oxygen saturation is 99%, which is normal. Head is normocephalic and atraumatic. PERRLA, EOMI. Oropharynx is clear. Neck is nontender and supple without adenopathy or JVD. Back is nontender and there is no CVA tenderness. Lungs are clear without rales, wheezes, or rhonchi. Chest is nontender. Heart has regular rate and rhythm without murmur. Abdomen is soft, flat,  nontender without masses or hepatosplenomegaly and peristalsis is normoactive. Extremities have no cyanosis or edema, full range of motion is present. Skin is warm and dry without rash. Neurologic: He is awake, alert, oriented, cooperative, cranial nerves are intact, there are no motor or sensory deficits. Psychiatric: Slightly flat affect. He does not appear to be responding to internal stimuli.  ED Treatments / Results   Procedures Procedures (including critical care time)  Medications Ordered in ED Medications - No data to display   Initial Impression / Assessment and Plan / ED Course  I have reviewed the triage vital signs and the nursing notes.   Clinical Course    Patient with long-standing schizophrenia with auditory hallucinations. No change in status compared with recent visits and psychiatric evaluations. No indication for inpatient hospitalization. He clearly needs to take his medications and could be considered for inpatient treatment if he fails appropriately managing his conditions by taking his medications. I've stressed the importance of medication compliance with the patient expresses understanding. Old records are reviewed showing multiple ED visits with similar presentations. He is given new prescriptions for his risperidone, benztropine, and trazodone. He is referred back to [facility] but also advised that he could go to behavioral health outpatient services for a second opinion.  Final Clinical Impressions(s) / ED Diagnoses   Final diagnoses:  Auditory hallucination  Schizophrenia, unspecified type [facility](HCC)    New Prescriptions Discharge Medication List as of 11/16/2016  5:08 AM       Dione Boozeavid Timonthy Hovater, MD 11/16/16 [phone]

## 2016-11-16 NOTE — ED Triage Notes (Signed)
Pt. reports chronic auditory hallucinations for several months , denies suicidal ideation , denies pain /respirations unlabored .

## 2016-11-20 ENCOUNTER — Emergency Department (HOSPITAL_COMMUNITY)
Admission: EM | Admit: 2016-11-20 | Discharge: 2016-11-20 | Disposition: A | Discharge status: Home / Self Care | Payer: Medicaid Other | Attending: Emergency Medicine | Admitting: Emergency Medicine

## 2016-11-20 ENCOUNTER — Encounter (HOSPITAL_COMMUNITY): Payer: Self-pay | Admitting: Emergency Medicine

## 2016-11-20 DIAGNOSIS — R440 Auditory hallucinations: Secondary | ICD-10-CM | POA: Diagnosis not present

## 2016-11-20 DIAGNOSIS — Z720 Tobacco use: Secondary | ICD-10-CM

## 2016-11-20 DIAGNOSIS — Z760 Encounter for issue of repeat prescription: Secondary | ICD-10-CM | POA: Insufficient documentation

## 2016-11-20 DIAGNOSIS — F1290 Cannabis use, unspecified, uncomplicated: Secondary | ICD-10-CM | POA: Diagnosis not present

## 2016-11-20 DIAGNOSIS — F17210 Nicotine dependence, cigarettes, uncomplicated: Secondary | ICD-10-CM | POA: Diagnosis not present

## 2016-11-20 DIAGNOSIS — Z7901 Long term (current) use of anticoagulants: Secondary | ICD-10-CM | POA: Insufficient documentation

## 2016-11-20 DIAGNOSIS — Z9114 Patient's other noncompliance with medication regimen: Secondary | ICD-10-CM | POA: Diagnosis not present

## 2016-11-20 MED ORDER — TRAZODONE HCL 50 MG PO TABS: ORAL_TABLET | ORAL | 0 refills | Status: DC

## 2016-11-20 MED ORDER — BENZTROPINE MESYLATE 1 MG PO TABS: 0.5000 mg | ORAL_TABLET | Freq: Two times a day (BID) | ORAL | 0 refills | Status: DC

## 2016-11-20 MED ORDER — RISPERIDONE 1 MG PO TABS: ORAL_TABLET | ORAL | 0 refills | Status: DC

## 2016-11-20 NOTE — Discharge Instructions (Signed)
Take your medications the way you're supposed to. Stop smoking. Stop using marijuana. Follow up with [facility] in 1 week to establish care and have ongoing management of your medical conditions, and follow up with [facility] for ongoing management of your psychiatric conditions. Call [facility] tomorrow to set up an appointment as soon as possible. You've been provided with a refill of your medications, but you need to get a primary doctor so you don't continue to come to the ER for medication refills. Return to the ER for emergent changes or worsening symptoms.

## 2016-11-20 NOTE — ED Notes (Signed)
Discussed instructions to get prescriptions filled, to stop smoking, and to call both [facility], and [facility] in the next 2 days. Pt verbalized understanding and repeated instructions back to this nurse. Pt stated he had no further questions. Belongings returned to pt

## 2016-11-20 NOTE — ED Provider Notes (Signed)
[facility]-EMERGENCY DEPT Provider Note   CSN: [id number] Arrival date & time: 11/20/16  1806     History   Chief Complaint Chief Complaint  Patient presents with  . hearing voices    HPI Cole Barrett is a 30 y.o. male with a PMHx of homelessness, HLD, paranoid behaviors, alcohol abuse, schizophrenia, and polysubstance abuse, who presents to the ED with complaints of auditory hallucinations "for a while". Patient states that he hears voices "telling (him) to going to kill (him)". This is a chronic issue. Patient states that he has been out of his medications for the last 3 months, including Risperdal, Cogentin, and trazodone. Chart review reveals multiple ED visits for similar complaints, last one on 11/16/16 when he was given rx refills for his risperdone, benzotropine, and trazodone, and told to f/up with [facility].  When asked about this, he states that he lost his prescriptions and the paperwork that he was given at his last ER visit. He has not yet followed up with [facility], and is requesting that we call them and make him an appointment. He has not called himself. He doesn't currently have a primary care doctor and has not called anywhere to get primary care established.  He denies any SI/HI/visual hallucinations, or alcohol use. Admits to being a cigarette smoker as well as a marijuana user. Denies any other medical complaints at this time.   The history is provided by the patient and medical records. No language interpreter was used.  Mental Health Problem  Presenting symptoms: hallucinations   Presenting symptoms: no homicidal ideas and no suicidal thoughts   Onset quality:  Unable to specify Timing:  Constant Progression:  Unchanged Chronicity:  Chronic Context: noncompliance   Treatment compliance:  Untreated Time since last psychoactive medication taken:  3 months Relieved by:  None tried Worsened by:  Nothing Ineffective treatments:  None tried Associated symptoms:  no abdominal pain and no chest pain   Risk factors: hx of mental illness     Past Medical History:  Diagnosis Date  . Homelessness   . Hypercholesterolemia   . Mental disorder   . Paranoid behavior (HCC)   . PE (pulmonary embolism)   . Schizophrenia [facility](HCC)     Patient Active Problem List   Diagnosis Date Noted  . Alcohol abuse   . Auditory hallucination   . 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 Adjustment disorder with depressed mood 09/07/2016  . Homelessness 09/03/2016  . Person feigning illness 09/03/2016  . 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 Cannabis use disorder, mild, abuse 08/29/2016  . Paranoid schizophrenia (HCC) 08/26/2016  . Pulmonary emboli (HCC) 07/13/2016  . 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 Acute left flank pain 01/02/2014  . Pulmonary embolism, bilateral (HCC) 01/02/2014    History reviewed. No pertinent surgical history.     Home Medications    Prior to Admission medications   Medication Sig Start Date End Date Taking? Authorizing Provider  acetaminophen (TYLENOL) 500 MG tablet Take 1 tablet (500 mg total) by mouth every 6 (six) hours as needed. Patient taking differently: Take 500 mg by mouth every 6 (six) hours as needed for mild pain, moderate pain or headache.  11/03/16   Antony MaduraKelly Humes, PA-C  benztropine (COGENTIN) 0.5 MG tablet Take 1 tablet (0.5 mg total) by mouth 2 (two) times daily. 11/16/16   Dione Boozeavid Glick, MD  pravastatin (PRAVACHOL) 20 MG tablet Take 20 mg by mouth at bedtime.    Historical Provider, MD  pravastatin (PRAVACHOL) 20 MG tablet Take 1 tablet (20 mg total) by mouth daily. 09/25/16   Tiffany Neva SeatGreene, PA-C  risperiDONE (RISPERDAL) 1 MG tablet Take 1 tablet (1 mg total) by mouth every morning. 11/16/16   Dione Boozeavid Glick, MD  risperiDONE (RISPERDAL) 3 MG tablet Take 1 tablet (3 mg total) by mouth at bedtime. 11/16/16   Dione Boozeavid Glick, MD  rivaroxaban (XARELTO) 20 MG TABS tablet Take 1 tablet (20 mg total) by  mouth daily with supper. 11/03/16   Antony MaduraKelly Humes, PA-C  traZODone (DESYREL) 50 MG tablet Take 1-2 tablets (50-100 mg total) by mouth at bedtime as needed for sleep. 11/16/16   Dione Boozeavid Glick, MD    Family History No family history on file.  Social History Social History  Substance Use Topics  . Smoking status: Current Every Day Smoker    Packs/day: 0.25    Years: 4.00    Types: Cigarettes  . Smokeless tobacco: Never Used  . Alcohol use 1.2 oz/week    2 Cans of beer per week     Comment: Daily. Smells ETOH.      Allergies   Haldol [haloperidol]   Review of Systems Review of Systems  Constitutional: Negative for chills and fever.  Respiratory: Negative for shortness of breath.   Cardiovascular: Negative for chest pain.  Gastrointestinal: Negative for abdominal pain, constipation, diarrhea, nausea and vomiting.  Genitourinary: Negative for dysuria and hematuria.  Musculoskeletal: Negative for arthralgias and myalgias.  Skin: Negative for color change.  Allergic/Immunologic: Negative for immunocompromised state.  Neurological: Negative for weakness and numbness.  Hematological: Bruises/bleeds easily (on xarelto).  Psychiatric/Behavioral: Positive for hallucinations. Negative for confusion, homicidal ideas and suicidal ideas.   10 Systems reviewed and are negative for acute change except as noted in the HPI.   Physical Exam Updated Vital Signs BP 145/95 (BP Location: Left Arm)   Pulse 102   Temp 98.5 F (36.9 C) (Oral)   Resp 18   SpO2 100%   Physical Exam  Constitutional: He is oriented to person, place, and time. Vital signs are normal. He appears well-developed and well-nourished.  Non-toxic appearance. No distress.  Afebrile, nontoxic, NAD  HENT:  Head: Normocephalic and atraumatic.  Mouth/Throat: Oropharynx is clear and moist and mucous membranes are normal.  Eyes: Conjunctivae and EOM are normal. Right eye exhibits no discharge. Left eye exhibits no discharge.    Neck: Normal range of motion. Neck supple.  Cardiovascular: Normal rate, regular rhythm, normal heart sounds and intact distal pulses.  Exam reveals no gallop and no friction rub.   No murmur heard. Pulmonary/Chest: Effort normal and breath sounds normal. No respiratory distress. He has no decreased breath sounds. He has no wheezes. He has no rhonchi. He has no rales.  Abdominal: Soft. Normal appearance and bowel sounds are normal. He exhibits no distension. There is no tenderness. There is no rigidity, no rebound, no guarding, no CVA tenderness, no tenderness at McBurney's point and negative Murphy's sign.  Musculoskeletal: Normal range of motion.  Neurological: He is alert and oriented to person, place, and time. He has normal strength. No sensory deficit.  Skin: Skin is warm, dry and intact. No rash noted.  Psychiatric: He has a normal mood and affect. He is actively hallucinating (endorsing AH). He expresses no homicidal and no suicidal ideation. He expresses no suicidal plans and no homicidal plans.  Calm and cooperative, endorsing AH hearing voices but doesn't seem to be responding to internal stimuli. Denies VH, SI, and HI  Nursing note and vitals reviewed.    ED Treatments / Results  Labs (all labs ordered are listed, but only abnormal results are displayed) Labs Reviewed - No data to display  EKG  EKG Interpretation None       Radiology No results found.  Procedures Procedures (including critical care time)  Medications Ordered in ED Medications - No data to display   Initial Impression / Assessment and Plan / ED Course  I have reviewed the triage vital signs and the nursing notes.  Pertinent labs & imaging results that were available during my care of the patient were reviewed by me and considered in my medical decision making (see chart for details).  Clinical Course     30 y.o. male here with auditory hallucinations which is chronic, related to his  noncompliance with medication regimen. Several visits for this, most recently on 11/16/16 and had refill rx's given but he claims he lost them. No VH, denies SI/HI, and no other complaints. Wants [place] to make him an appt with [facility]. Discussed that med refills at the ER is an inappropriate use of ER resources, and that we don't make the appt for him, he will need to call them himself to make the appt. Urged him to also find a PCP, given referral to [facility] to establish care with them in the next 1-2wks so he can have regular medical care and ongoing refills. Admits to cigarette smoking and THC use, smoking cessation advised. No other indication for inpatient psychiatric care, will d/c home with refills of meds, and referral for outpatient psych care. I explained the diagnosis and have given explicit precautions to return to the ER including for any other new or worsening symptoms. The patient understands and accepts the medical plan as it's been dictated and I have answered their questions. Discharge instructions concerning home care and prescriptions have been given. The patient is STABLE and is discharged to home in good condition.   Final Clinical Impressions(s) / ED Diagnoses   Final diagnoses:  Auditory hallucinations  Noncompliance with medication regimen  Marijuana use  Tobacco use  Encounter for medication refill    New Prescriptions New Prescriptions   BENZTROPINE (COGENTIN) 1 MG TABLET    Take 0.5 tablets (0.5 mg total) by mouth 2 (two) times daily.   RISPERIDONE (RISPERDAL) 1 MG TABLET    Take 1 tablet (1mg ) PO every morning and 3 tablets (3mg ) PO QHS   TRAZODONE (DESYREL) 50 MG TABLET    Take 1-2 tablets (50-100mg ) PO at bedtime PRN sleep     Yanique Mulvihill Camprubi-Soms, PA-C 11/20/16 1906    Shaune Pollack, MD 11/21/16 1156

## 2016-11-20 NOTE — ED Triage Notes (Signed)
Per pt, states he has not taken his psych meds in over 3 months-hearing voices, states he wants help in getting back into [facility] so he can get regulated on his medications so the voices will stop

## 2016-11-21 ENCOUNTER — Encounter (HOSPITAL_COMMUNITY): Payer: Self-pay | Admitting: Emergency Medicine

## 2016-11-21 ENCOUNTER — Emergency Department (HOSPITAL_COMMUNITY)
Admission: EM | Admit: 2016-11-21 | Discharge: 2016-11-22 | Disposition: A | Discharge status: Home / Self Care | Payer: Medicaid Other | Attending: Emergency Medicine | Admitting: Emergency Medicine

## 2016-11-21 DIAGNOSIS — M25512 Pain in left shoulder: Secondary | ICD-10-CM | POA: Insufficient documentation

## 2016-11-21 DIAGNOSIS — G8929 Other chronic pain: Secondary | ICD-10-CM | POA: Insufficient documentation

## 2016-11-21 MED ORDER — DICLOFENAC 35 MG PO CAPS: 35.0000 mg | ORAL_CAPSULE | Freq: Three times a day (TID) | ORAL | 0 refills | Status: DC

## 2016-11-21 NOTE — ED Triage Notes (Signed)
Pt states he was seen here 3 weeks ago with right shoulder pain and was prescribed a cream and has had no relief.  States his PCP directed him to come here if he didn't get any better.  States, "just need yall to get me some pain pills or something to calm it down".  Ambulatory in triage.

## 2016-11-21 NOTE — ED Provider Notes (Signed)
[facility]-EMERGENCY DEPT Provider Note   CSN: [id number] Arrival date & time: 11/21/16  2154  By signing my name below, I, Cole Barrett, attest that this documentation has been prepared under the direction and in the presence of Cole Peliffany Freddie Nghiem, PA-C. Electronically Signed: Doreatha MartinEva Barrett, ED Scribe. 11/21/16. 11:22 PM.    History   Chief Complaint Chief Complaint  Patient presents with  . Shoulder Pain    HPI Cole Barrett is a 30 y.o. male who presents to the Emergency Department complaining of moderate, persistent right shoulder pain and soreness for years. Per pt, his pain is worsened with movement. Pt states he has been prescribed Diclofenac cream by his PCP and states this has not relieved his pain. He has not tried any other treatments. Pt states his pain is a result of a prior injury years ago. He denies any recent injury, trauma or fall. Pt also denies numbness or weakness. No new injury, redness or swelling. He has only tried using a small amount of the cream.  The history is provided by the patient. No language interpreter was used.    Past Medical History:  Diagnosis Date  . Homelessness   . Hypercholesterolemia   . Mental disorder   . Paranoid behavior (HCC)   . PE (pulmonary embolism)   . Schizophrenia [facility](HCC)     Patient Active Problem List   Diagnosis Date Noted  . Alcohol abuse   . Auditory hallucination   . 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 Adjustment disorder with depressed mood 09/07/2016  . Homelessness 09/03/2016  . Person feigning illness 09/03/2016  . 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 Cannabis use disorder, mild, abuse 08/29/2016  . Paranoid schizophrenia (HCC) 08/26/2016  . Pulmonary emboli (HCC) 07/13/2016  . 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 Acute left flank pain 01/02/2014  . Pulmonary embolism, bilateral (HCC) 01/02/2014    History reviewed. No pertinent surgical history.     Home Medications      Prior to Admission medications   Medication Sig Start Date End Date Taking? Authorizing Provider  acetaminophen (TYLENOL) 500 MG tablet Take 1 tablet (500 mg total) by mouth every 6 (six) hours as needed. Patient taking differently: Take 500 mg by mouth every 6 (six) hours as needed for mild pain, moderate pain or headache.  11/03/16   Antony MaduraKelly Humes, PA-C  benztropine (COGENTIN) 0.5 MG tablet Take 1 tablet (0.5 mg total) by mouth 2 (two) times daily. 11/16/16   Dione Boozeavid Glick, MD  benztropine (COGENTIN) 1 MG tablet Take 0.5 tablets (0.5 mg total) by mouth 2 (two) times daily. 11/20/16   Mercedes Camprubi-Soms, PA-C  Diclofenac 35 MG CAPS Take 35 mg by mouth 3 (three) times daily. 11/21/16   Elsia Lasota Neva SeatGreene, PA-C  pravastatin (PRAVACHOL) 20 MG tablet Take 1 tablet (20 mg total) by mouth daily. 09/25/16   Merian Wroe Neva SeatGreene, PA-C  risperiDONE (RISPERDAL) 1 MG tablet Take 1 tablet (1 mg total) by mouth every morning. 11/16/16   Dione Boozeavid Glick, MD  risperiDONE (RISPERDAL) 1 MG tablet Take 1 tablet (1mg ) PO every morning and 3 tablets (3mg ) PO QHS 11/20/16   Mercedes Camprubi-Soms, PA-C  risperiDONE (RISPERDAL) 3 MG tablet Take 1 tablet (3 mg total) by mouth at bedtime. 11/16/16   Dione Boozeavid Glick, MD  rivaroxaban (XARELTO) 20 MG TABS tablet Take 1 tablet (20 mg total) by mouth daily with supper. Patient not taking: Reported on 11/20/2016 11/03/16   Antony MaduraKelly Humes, PA-C  traZODone (DESYREL) 50 MG tablet  Take 1-2 tablets (50-100 mg total) by mouth at bedtime as needed for sleep. 11/16/16   [name], MD  traZODone (DESYREL) 50 MG tablet Take 1-2 tablets (50-100mg ) PO at bedtime PRN sleep 11/20/16   Mercedes Camprubi-Soms, PA-C    Family History No family history on file.  Social History Social History  Substance Use Topics  . Smoking status: Current Every Day Smoker    Packs/day: 0.25    Years: 4.00    Types: Cigarettes  . Smokeless tobacco: Never Used  . Alcohol use No     Comment: States no as of 12/25      Allergies   Haldol [haloperidol]   Review of Systems Review of Systems  Musculoskeletal: Positive for arthralgias.  Neurological: Negative for numbness.  All other systems reviewed and are negative.    Physical Exam Updated Vital Signs BP 134/80 (BP Location: Right Arm)   Pulse 93   Temp 97.5 F (36.4 C) (Oral)   Resp 18   Ht 5\' 10"  (1.778 m)   Wt 99.8 kg   SpO2 100%   BMI 31.57 kg/m   Physical Exam  Constitutional: He appears well-developed and well-nourished.  HENT:  Head: Normocephalic.  Eyes: Conjunctivae are normal.  Cardiovascular: Normal rate.   Pulmonary/Chest: Effort normal. No respiratory distress.  Abdominal: He exhibits no distension.  Musculoskeletal: Normal range of motion.       Right shoulder: He exhibits tenderness. He exhibits normal range of motion, no bony tenderness, no swelling, no effusion and no crepitus.  Soreness with ROM of right shoulder.   Neurological: He is alert.  Skin: Skin is warm and dry.  Psychiatric: He has a normal mood and affect. His behavior is normal.  Nursing note and vitals reviewed.    ED Treatments / Results   DIAGNOSTIC STUDIES: Oxygen Saturation is 100% on RA, normal by my interpretation.    COORDINATION OF CARE: 11:18 PM Discussed treatment plan with pt at bedside which includes PO Diclofenac and pt agreed to plan.   Labs (all labs ordered are listed, but only abnormal results are displayed) Labs Reviewed - No data to display  EKG  EKG Interpretation None       Radiology No results found.  Procedures Procedures (including critical care time)  Medications Ordered in ED Medications - No data to display   Initial Impression / Assessment and Plan / ED Course  I have reviewed the triage vital signs and the nursing notes.  Pertinent labs & imaging results that were available during my care of the patient were reviewed by me and considered in my medical decision making (see chart for  details).  Clinical Course     Rx topical diclofenac and will try PO abx. Patient X-Ray recently negative for obvious fracture or dislocation.  Pt advised to follow up with orthopedics. Patient given first dose while in ED, conservative therapy recommended and discussed. Patient will be discharged home & is agreeable with above plan. Returns precautions discussed. Pt appears safe for discharge.   Final Clinical Impressions(s) / ED Diagnoses   Final diagnoses:  Chronic left shoulder pain    New Prescriptions New Prescriptions   DICLOFENAC 35 MG CAPS    Take 35 mg by mouth 3 (three) times daily.    I personally performed the services described in this documentation, which was scribed in my presence. The recorded information has been reviewed and is accurate.    Cole Pel, PA-C 11/21/16 2334    [name]  Nedra HaiJ Pollina, MD 11/22/16 [phone]

## 2016-11-23 ENCOUNTER — Emergency Department (EMERGENCY_DEPARTMENT_HOSPITAL)
Admission: EM | Admit: 2016-11-23 | Discharge: 2016-11-24 | Disposition: A | Discharge status: Home / Self Care | Payer: Medicaid Other | Source: Home / Self Care | Attending: Emergency Medicine | Admitting: Emergency Medicine

## 2016-11-23 ENCOUNTER — Encounter (HOSPITAL_COMMUNITY): Payer: Self-pay | Admitting: *Deleted

## 2016-11-23 ENCOUNTER — Emergency Department (HOSPITAL_COMMUNITY)
Admission: EM | Admit: 2016-11-23 | Discharge: 2016-11-23 | Disposition: A | Discharge status: Home / Self Care | Payer: Medicaid Other | Source: Home / Self Care | Attending: Emergency Medicine | Admitting: Emergency Medicine

## 2016-11-23 ENCOUNTER — Encounter (HOSPITAL_COMMUNITY): Payer: Self-pay | Admitting: Emergency Medicine

## 2016-11-23 DIAGNOSIS — F17210 Nicotine dependence, cigarettes, uncomplicated: Secondary | ICD-10-CM | POA: Insufficient documentation

## 2016-11-23 DIAGNOSIS — F1014 Alcohol abuse with alcohol-induced mood disorder: Secondary | ICD-10-CM | POA: Insufficient documentation

## 2016-11-23 DIAGNOSIS — R440 Auditory hallucinations: Secondary | ICD-10-CM

## 2016-11-23 DIAGNOSIS — Z7901 Long term (current) use of anticoagulants: Secondary | ICD-10-CM

## 2016-11-23 DIAGNOSIS — F1094 Alcohol use, unspecified with alcohol-induced mood disorder: Secondary | ICD-10-CM | POA: Diagnosis not present

## 2016-11-23 DIAGNOSIS — Z590 Homelessness: Secondary | ICD-10-CM | POA: Diagnosis not present

## 2016-11-23 DIAGNOSIS — R45850 Homicidal ideations: Secondary | ICD-10-CM | POA: Diagnosis not present

## 2016-11-23 DIAGNOSIS — Z765 Malingerer [conscious simulation]: Secondary | ICD-10-CM | POA: Diagnosis not present

## 2016-11-23 DIAGNOSIS — F4321 Adjustment disorder with depressed mood: Secondary | ICD-10-CM | POA: Diagnosis not present

## 2016-11-23 LAB — COMPREHENSIVE METABOLIC PANEL WITH GFR
ALT: 32 U/L (ref 17–63)
ALT: 36 U/L (ref 17–63)
AST: 26 U/L (ref 15–41)
AST: 33 U/L (ref 15–41)
Albumin: 3.9 g/dL (ref 3.5–5.0)
Albumin: 4.1 g/dL (ref 3.5–5.0)
Alkaline Phosphatase: 49 U/L (ref 38–126)
Alkaline Phosphatase: 53 U/L (ref 38–126)
Anion gap: 12 (ref 5–15)
Anion gap: 8 (ref 5–15)
BUN: <5 mg/dL — ABNORMAL LOW (ref 6–20)
BUN: 14 mg/dL (ref 6–20)
CO2: 23 mmol/L (ref 22–32)
CO2: 26 mmol/L (ref 22–32)
Calcium: 9 mg/dL (ref 8.9–10.3)
Calcium: 8.8 mg/dL — ABNORMAL LOW (ref 8.9–10.3)
Chloride: 104 mmol/L (ref 101–111)
Chloride: 105 mmol/L (ref 101–111)
Creatinine, Ser: 0.96 mg/dL (ref 0.61–1.24)
Creatinine, Ser: 1.02 mg/dL (ref 0.61–1.24)
GFR calc Af Amer: >60 mL/min
GFR calc Af Amer: >60 mL/min
GFR calc non Af Amer: >60 mL/min
GFR calc non Af Amer: >60 mL/min
Glucose, Bld: 95 mg/dL (ref 65–99)
Glucose, Bld: 93 mg/dL (ref 65–99)
Potassium: 3.8 mmol/L (ref 3.5–5.1)
Potassium: 3.8 mmol/L (ref 3.5–5.1)
Sodium: 139 mmol/L (ref 135–145)
Sodium: 139 mmol/L (ref 135–145)
Total Bilirubin: 0.7 mg/dL (ref 0.3–1.2)
Total Bilirubin: 0.3 mg/dL (ref 0.3–1.2)
Total Protein: 7.4 g/dL (ref 6.5–8.1)
Total Protein: 7.8 g/dL (ref 6.5–8.1)

## 2016-11-23 LAB — ETHANOL: Alcohol, Ethyl (B): 75 mg/dL — ABNORMAL HIGH (ref <5)

## 2016-11-23 LAB — CBC
HCT: 38.4 % — ABNORMAL LOW (ref 39.0–52.0)
HCT: 37.6 % — ABNORMAL LOW (ref 39.0–52.0)
Hemoglobin: 13 g/dL (ref 13.0–17.0)
Hemoglobin: 12.5 g/dL — ABNORMAL LOW (ref 13.0–17.0)
MCH: 27.5 pg (ref 26.0–34.0)
MCH: 26.7 pg (ref 26.0–34.0)
MCHC: 33.9 g/dL (ref 30.0–36.0)
MCHC: 33.2 g/dL (ref 30.0–36.0)
MCV: 81.4 fL (ref 78.0–100.0)
MCV: 80.3 fL (ref 78.0–100.0)
PLATELETS: 304 10*3/uL (ref 150–400)
Platelets: 318 10*3/uL (ref 150–400)
RBC: 4.72 MIL/uL (ref 4.22–5.81)
RBC: 4.68 MIL/uL (ref 4.22–5.81)
RDW: 13.5 % (ref 11.5–15.5)
RDW: 13.8 % (ref 11.5–15.5)
WBC: 5.3 10*3/uL (ref 4.0–10.5)
WBC: 5.2 10*3/uL (ref 4.0–10.5)

## 2016-11-23 LAB — RAPID URINE DRUG SCREEN, HOSP PERFORMED
Amphetamines: NOT DETECTED
Amphetamines: NOT DETECTED
BARBITURATES: NOT DETECTED
Barbiturates: NOT DETECTED
Benzodiazepines: NOT DETECTED
Benzodiazepines: NOT DETECTED
COCAINE: NOT DETECTED
Cocaine: NOT DETECTED
OPIATES: NOT DETECTED
Opiates: NOT DETECTED
Tetrahydrocannabinol: NOT DETECTED
Tetrahydrocannabinol: NOT DETECTED

## 2016-11-23 MED ORDER — TRAZODONE HCL 50 MG PO TABS: 50.0000 mg | ORAL_TABLET | Freq: Every evening | ORAL | 0 refills | Status: DC | PRN

## 2016-11-23 MED ORDER — RISPERIDONE 3 MG PO TABS: 3.0000 mg | ORAL_TABLET | Freq: Every day | ORAL | 0 refills | Status: DC

## 2016-11-23 MED ORDER — BENZTROPINE MESYLATE 0.5 MG PO TABS: 0.5000 mg | ORAL_TABLET | Freq: Two times a day (BID) | ORAL | 0 refills | Status: DC

## 2016-11-23 NOTE — ED Provider Notes (Signed)
[facility]-EMERGENCY DEPT Provider Note   CSN: [id number] Arrival date & time: 11/23/16  2133     History   Chief Complaint Chief Complaint  Patient presents with  . Hallucinations  . Homicidal   The history is provided by the patient and medical records.    HPI Comments: Cole Barrett is a 30 y.o. male brought in by [employer] voluntarily who presents to the Emergency Department complaining of auditory hallucinations. Pt states voices are telling him to hurt others, and these voices also tell him that they will hurt him. He denies suicidal ideations. He states that although voices tell him to hurt people he does not want to hurt anyone. He believes he needs long term treatment for his symptoms so that he will not act on the voices. Denies visual hallucinations. Patient states that he is suppose to be on medication for auditory hallucinations, however has been out for 1-2 months because he does not have the money to afford prescriptions.    Past Medical History:  Diagnosis Date  . Homelessness   . Hypercholesterolemia   . Mental disorder   . Paranoid behavior (HCC)   . PE (pulmonary embolism)   . Schizophrenia [facility](HCC)     Patient Active Problem List   Diagnosis Date Noted  . Alcohol abuse   . Auditory hallucination   . 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 Adjustment disorder with depressed mood 09/07/2016  . Homelessness 09/03/2016  . Person feigning illness 09/03/2016  . 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 Cannabis use disorder, mild, abuse 08/29/2016  . Paranoid schizophrenia (HCC) 08/26/2016  . Pulmonary emboli (HCC) 07/13/2016  . 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 Acute left flank pain 01/02/2014  . Pulmonary embolism, bilateral (HCC) 01/02/2014    History reviewed. No pertinent surgical history.     Home Medications    Prior to Admission medications   Medication Sig Start Date End Date Taking? Authorizing Provider    acetaminophen (TYLENOL) 500 MG tablet Take 1 tablet (500 mg total) by mouth every 6 (six) hours as needed. Patient taking differently: Take 500 mg by mouth every 6 (six) hours as needed for mild pain, moderate pain or headache.  11/03/16  Yes Antony MaduraKelly Humes, PA-C  benztropine (COGENTIN) 0.5 MG tablet Take 1 tablet (0.5 mg total) by mouth 2 (two) times daily. 11/23/16  Yes Tiffany Neva SeatGreene, PA-C  benztropine (COGENTIN) 1 MG tablet Take 0.5 tablets (0.5 mg total) by mouth 2 (two) times daily. 11/20/16  Yes Mercedes Camprubi-Soms, PA-C  Diclofenac 35 MG CAPS Take 35 mg by mouth 3 (three) times daily. 11/21/16  Yes Tiffany Neva SeatGreene, PA-C  pravastatin (PRAVACHOL) 20 MG tablet Take 1 tablet (20 mg total) by mouth daily. 09/25/16  Yes Tiffany Neva SeatGreene, PA-C  risperiDONE (RISPERDAL) 1 MG tablet Take 1 tablet (1 mg total) by mouth every morning. 11/16/16  Yes Dione Boozeavid Glick, MD  risperiDONE (RISPERDAL) 1 MG tablet Take 1 tablet (1mg ) PO every morning and 3 tablets (3mg ) PO QHS 11/20/16  Yes Mercedes Camprubi-Soms, PA-C  risperiDONE (RISPERDAL) 3 MG tablet Take 1 tablet (3 mg total) by mouth at bedtime. 11/23/16  Yes Tiffany Neva SeatGreene, PA-C  traZODone (DESYREL) 50 MG tablet Take 1-2 tablets (50-100mg ) PO at bedtime PRN sleep 11/20/16  Yes Mercedes Camprubi-Soms, PA-C  traZODone (DESYREL) 50 MG tablet Take 1-2 tablets (50-100 mg total) by mouth at bedtime as needed for sleep. 11/23/16  Yes Tiffany Neva SeatGreene, PA-C  rivaroxaban (XARELTO) 20 MG TABS tablet Take 1  tablet (20 mg total) by mouth daily with supper. 11/03/16   [name], PA-C    Family History Family History  Problem Relation Age of Onset  . Hypertension Mother     Social History Social History  Substance Use Topics  . Smoking status: Current Every Day Smoker    Packs/day: 0.25    Years: 4.00    Types: Cigarettes  . Smokeless tobacco: Never Used  . Alcohol use Yes     Allergies   Haldol [haloperidol]   Review of Systems Review of Systems   Psychiatric/Behavioral: Positive for hallucinations. Negative for suicidal ideas.  All other systems reviewed and are negative.    Physical Exam Updated Vital Signs BP 132/75 (BP Location: Right Arm)   Pulse 67   Temp 98.9 F (37.2 C) (Oral)   Resp 14   SpO2 100%   Physical Exam  Constitutional: He is oriented to person, place, and time. He appears well-developed and well-nourished. No distress.  HENT:  Head: Normocephalic and atraumatic.  Cardiovascular: Normal rate, regular rhythm and normal heart sounds.   No murmur heard. Pulmonary/Chest: Effort normal and breath sounds normal. No respiratory distress.  Abdominal: Soft. He exhibits no distension. There is no tenderness.  Musculoskeletal: He exhibits no edema.  Neurological: He is alert and oriented to person, place, and time.  Skin: Skin is warm and dry.  Psychiatric: He has a normal mood and affect.  Maintains good eye contact throughout examination. Clear and goal oriented speech.  Nursing note and vitals reviewed.    ED Treatments / Results  DIAGNOSTIC STUDIES: Oxygen Saturation is 100% on RA, normal by my interpretation.    COORDINATION OF CARE: 1:12 AM Discussed treatment plan with pt at bedside and pt agreed to plan.  Labs (all labs ordered are listed, but only abnormal results are displayed) Labs Reviewed  COMPREHENSIVE METABOLIC PANEL - Abnormal; Notable for the following:       Result Value   Calcium 8.8 (*)    All other components within normal limits  CBC - Abnormal; Notable for the following:    Hemoglobin 12.5 (*)    HCT 37.6 (*)    All other components within normal limits  ETHANOL  RAPID URINE DRUG SCREEN, HOSP PERFORMED    EKG  EKG Interpretation None       Radiology No results found.  Procedures Procedures (including critical care time)  Medications Ordered in ED Medications - No data to display   Initial Impression / Assessment and Plan / ED Course  I have reviewed the  triage vital signs and the nursing notes.  Pertinent labs & imaging results that were available during my care of the patient were reviewed by me and considered in my medical decision making (see chart for details).  Clinical Course    Cole Barrett is a 31 y.o. male who presents to ED for auditory hallucinations which appears have been going on for several months now. He states that today voices began telling him to hurt people. He is requesting inpatient treatment so that he will not act on these impulses. On exam, patient maintains good eye contact. He has clear and goal oriented speech and does not seem to be responding to external stimuli. Per chart review, it appears he is well known to ED. Will consult TTS with disposition pending their assessment.   TTS recommends patient to see psychiatry in am. Dispo pending psych recommendations.   Final Clinical Impressions(s) / ED Diagnoses  Final diagnoses:  None    New Prescriptions New Prescriptions   No medications on file   I personally performed the services described in this documentation, which was scribed in my presence. The recorded information has been reviewed and is accurate.    [facility]venthealth SebringJaime Pilcher Jeneane Pieczynski, PA-C 11/24/16 0112    Gerhard Munchobert Lockwood, MD 11/25/16 [id number]

## 2016-11-23 NOTE — ED Notes (Signed)
Per MD plan is to allow pt to sleep and will discharge

## 2016-11-23 NOTE — ED Provider Notes (Signed)
[facility]-EMERGENCY DEPT Provider Note   CSN: [id number] Arrival date & time: 11/23/16  0056     History   Chief Complaint Chief Complaint  Patient presents with  . Psychiatric Evaluation    HPI Cole Barrett is a 30 y.o. male.  HPI   Cole Barrett is a 30 y.o. male with PMHx of homelessness and schizophrenia, who presents to the Emergency Department for an evaluation of auditory hallucinations that began ~6 months ago. Pt reports "he hears voices off and on" that say "they are going to kill him." He states he lost his prescription medication for his schizophrenia and "does not know how to go about getting them" which is why he visited the ED tonight. He denies suicidal ideation.     Past Medical History:  Diagnosis Date  . Homelessness   . Hypercholesterolemia   . Mental disorder   . Paranoid behavior (HCC)   . PE (pulmonary embolism)   . Schizophrenia [facility](HCC)     Patient Active Problem List   Diagnosis Date Noted  . Alcohol abuse   . Auditory hallucination   . 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 Adjustment disorder with depressed mood 09/07/2016  . Homelessness 09/03/2016  . Person feigning illness 09/03/2016  . 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 Cannabis use disorder, mild, abuse 08/29/2016  . Paranoid schizophrenia (HCC) 08/26/2016  . Pulmonary emboli (HCC) 07/13/2016  . 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 Acute left flank pain 01/02/2014  . Pulmonary embolism, bilateral (HCC) 01/02/2014    History reviewed. No pertinent surgical history.     Home Medications    Prior to Admission medications   Medication Sig Start Date End Date Taking? Authorizing Provider  acetaminophen (TYLENOL) 500 MG tablet Take 1 tablet (500 mg total) by mouth every 6 (six) hours as needed. Patient taking differently: Take 500 mg by mouth every 6 (six) hours as needed for mild pain, moderate pain or headache.  11/03/16   Antony MaduraKelly  Humes, PA-C  benztropine (COGENTIN) 0.5 MG tablet Take 1 tablet (0.5 mg total) by mouth 2 (two) times daily. 11/23/16   Nimrat Woolworth Neva SeatGreene, PA-C  benztropine (COGENTIN) 1 MG tablet Take 0.5 tablets (0.5 mg total) by mouth 2 (two) times daily. 11/20/16   Mercedes Camprubi-Soms, PA-C  Diclofenac 35 MG CAPS Take 35 mg by mouth 3 (three) times daily. 11/21/16   Ethelbert Thain Neva SeatGreene, PA-C  pravastatin (PRAVACHOL) 20 MG tablet Take 1 tablet (20 mg total) by mouth daily. 09/25/16   Markie Frith Neva SeatGreene, PA-C  risperiDONE (RISPERDAL) 1 MG tablet Take 1 tablet (1 mg total) by mouth every morning. 11/16/16   Dione Boozeavid Glick, MD  risperiDONE (RISPERDAL) 1 MG tablet Take 1 tablet (1mg ) PO every morning and 3 tablets (3mg ) PO QHS 11/20/16   Mercedes Camprubi-Soms, PA-C  risperiDONE (RISPERDAL) 3 MG tablet Take 1 tablet (3 mg total) by mouth at bedtime. 11/23/16   Dearion Huot Neva SeatGreene, PA-C  rivaroxaban (XARELTO) 20 MG TABS tablet Take 1 tablet (20 mg total) by mouth daily with supper. Patient not taking: Reported on 11/20/2016 11/03/16   Antony MaduraKelly Humes, PA-C  traZODone (DESYREL) 50 MG tablet Take 1-2 tablets (50-100mg ) PO at bedtime PRN sleep 11/20/16   Mercedes Camprubi-Soms, PA-C  traZODone (DESYREL) 50 MG tablet Take 1-2 tablets (50-100 mg total) by mouth at bedtime as needed for sleep. 11/23/16   Marlon Peliffany Mahad Newstrom, PA-C    Family History No family history on file.  Social History Social History  Substance Use Topics  .  Smoking status: Current Every Day Smoker    Packs/day: 0.25    Years: 4.00    Types: Cigarettes  . Smokeless tobacco: Never Used  . Alcohol use No     Comment: States no as of 12/25     Allergies   Haldol [haloperidol]   Review of Systems Review of Systems  Review of Systems All other systems negative except as documented in the HPI. All pertinent positives and negatives as reviewed in the HPI.  Physical Exam Updated Vital Signs BP 122/56   Pulse 80   Temp 97.6 F (36.4 C) (Oral)   Resp 18   Ht 5'  10" (1.778 m)   Wt 99.8 kg   SpO2 97%   BMI 31.57 kg/m   Physical Exam Constitutional: He is oriented to person, place, and time. Vital signs are normal. He appears well-developed and well-nourished.  Non-toxic appearance. No distress.  Afebrile, nontoxic, NAD  HENT:  Head: Normocephalic and atraumatic.  Mouth/Throat: Oropharynx is clear and moist and mucous membranes are normal.  Eyes: Conjunctivae and EOM are normal. Right eye exhibits no discharge. Left eye exhibits no discharge.  Neck: Normal range of motion. Neck supple.  Cardiovascular: Normal rate, regular rhythm, normal heart sounds and intact distal pulses.  Exam reveals no gallop and no friction rub.  No murmur heard. Pulmonary/Chest: Effort normal and breath sounds normal. No respiratory distress. He has no decreased breath sounds. He has no wheezes. He has no rhonchi. He has no rales.  Abdominal: Soft. Normal appearance and bowel sounds are normal. He exhibits no distension. There is no tenderness. There is no rigidity, no rebound, no guarding, no CVA tenderness, no tenderness at McBurney's point and negative Murphy's sign.  Musculoskeletal: Normal range of motion.  Neurological: He is alert and oriented to person, place, and time. He has normal strength. No sensory deficit.  Skin: Skin is warm, dry and intact. No rash noted.  Psychiatric: He has a normal mood and affect. He is endorsing AH. He expresses no homicidal and no suicidal ideation. He expresses no suicidal plans and no homicidal plans.  Calm and cooperative, endorsing AH hearing voices but doesn't seem to be responding to internal stimuli. Denies VH, SI, and HI  Nursing note and vitals reviewed.   ED Treatments / Results  Labs (all labs ordered are listed, but only abnormal results are displayed) Labs Reviewed  COMPREHENSIVE METABOLIC PANEL - Abnormal; Notable for the following:       Result Value   BUN <5 (*)    All other components within normal limits    ETHANOL - Abnormal; Notable for the following:    Alcohol, Ethyl (B) 75 (*)    All other components within normal limits  CBC - Abnormal; Notable for the following:    HCT 38.4 (*)    All other components within normal limits  RAPID URINE DRUG SCREEN, HOSP PERFORMED    EKG  EKG Interpretation None       Radiology No results found.  Procedures Procedures (including critical care time)  Medications Ordered in ED Medications - No data to display   Initial Impression / Assessment and Plan / ED Course  I have reviewed the triage vital signs and the nursing notes.  Pertinent labs & imaging results that were available during my care of the patient were reviewed by me and considered in my medical decision making (see chart for details).  Clinical Course    30 y.o. male here with  auditory hallucinations which is chronic, related to his noncompliance with medication regimen. Several visits for this, most recently on 11/20/16 and had refill rx's given but he claims he lost them. No VH, denies SI/HI, and no other complaints. Wants us to make him an appt with [facility]. Discussed that med refills at the ER is an inappropriate use of ER resources, and that we don't make the appt for him, he will need to call them himself to make the appt. Urged him to also find a PCP, [facility] in the next 1-2wks so he can have regular medical care and ongoing refills. Admits to cigarette smoking and THC use, smoking cessation advised. No other indication for inpatient psychiatric care, will d/c home with refills of meds, and referral for outpatient psych care. I explained the diagnosis and have given explicit precautions to return to the ER including for any other new or worsening symptoms. The patient understands and accepts the medical plan as it's been dictated and I have answered their questions. Discharge instructions concerning home care and prescriptions have been  given. The patient is STABLE and is discharged to home in good condition.  Given more prescriptions, [facility] and [place]. Pt told he does not meet inpatient treatment until he tries his prescribed medications first.   Final Clinical Impressions(s) / ED Diagnoses   Final diagnoses:  Auditory hallucinations    New Prescriptions Current Discharge Medication List       Marlon Peliffany Jobany Montellano, PA-C 11/23/16 0530    Gilda Creasehristopher J Pollina, MD 11/23/16 [phone]

## 2016-11-23 NOTE — ED Triage Notes (Signed)
The pt is c/o hearing voices all night he just left [place] yesteday

## 2016-11-23 NOTE — Discharge Instructions (Signed)
RESOURCE GUIDE ° °Chronic Pain Problems: °Contact [facility]  [phone] °Patients need to be referred by their primary care doctor. ° °Insufficient Money for Medicine: °Contact [employer] Way:  call "211" or [facility] 271-5999. ° °No Primary Care Doctor: °Call [facility]  832-8000 - can help you locate a primary care doctor that  accepts your insurance, provides certain services, etc. °Physician Referral Service- 1-800-533-3463 ° °Agencies that provide inexpensive medical care: °[facility]  832-8035 °[facility]  832-7272 °[facility]  271-5999 °Women's Clinic  832-4777 °Planned Parenthood  373-0678 °[name]  272-1050 ° °Medicaid-accepting [name] Providers: °[facility]- [address] ° 641-2100, Mon-Fri 9am-7pm, Sat 9am-1pm °[facility]- [address], [address] ° [phone] °[facility]- [address], [address] ° [phone] °[facility]- [address] [place] [address] ° 299-7000 °[facility]- [address], 373-1557 ° Only accepts [place] Access Medicaid patients after they have their name  applied to their card ° °Self Pay (no insurance) in [name]: °Sickle Cell Patients: Dr Eric Dean, [name] ° [address], 832-1970 °[facility]- [address] ° [phone] °      -     [facility]- [address] °      -     [facility]- see information above (Speak to Pam H if you do not have insurance) °      -  [facility]- [address], 271-5999 °      -  [facility] [place]- [address],  [phone] °      -  [facility]- [address] [place] [address], [phone] °      -  Dr Osei-Bonsu-  [address], [place], [phone] °      -  [facility]- [address], [phone] °      -   [facility]- [address] [place] [address], [phone], also [address], [phone] °      -    [facility]- [address], [phone], 1st & 3rd Saturday   every month, 10am-1pm ° °1) Find a Doctor and Pay Out of Pocket °Although you won't have to find out who is covered by your insurance plan, it is a good idea to ask around and get recommendations. You will then need to call the office and see if the doctor you have chosen will accept you as a new patient and what types of options they offer for patients who are self-pay. Some doctors offer discounts or will set up payment plans for their patients who do not have insurance, but you will need to ask so you aren't surprised when you get to your appointment. ° °2) Contact Your Local Health Department °Not all health departments have doctors that can see patients for sick visits, but many do, so it is worth a call to see if yours does. If you don't know where your local health department is, you can check in your phone book. The CDC also has a tool to help you locate your state's health department, and many state websites also have listings of all of their local health departments. ° °3) Find a Walk-in Clinic °If   your illness is not likely to be very severe or complicated, you may want to try a walk in clinic. These are popping up all over the country in pharmacies, drugstores, and shopping centers. They're usually staffed by nurse practitioners or physician assistants that have been trained to treat common illnesses and complaints. They're usually fairly quick and inexpensive. However, if you have serious medical issues or chronic medical problems, these are probably not your best option ° °STD Testing °[name] [facility] [place], [facility]D Clinic, [address], [place], phone [phone] or 1-877-539-9860.  Monday - Friday, call for an appointment. °[name] [facility] [place], STD  Clinic, [address], [place], phone 641-3245 or 1-877-539-9860.  Monday - Friday, call for an appointment. ° °Abuse/Neglect: °[name] Child Abuse Hotline (336) 641-3795 °[name] Child Abuse Hotline 800-378-5315 (After Hours) ° °Emergency Shelter:  [facility] (336) 271-5985 ° °Maternity Homes: °[facility] (336) 275-9566 °[facility] (704) 372-4663 ° °MRSA Hotline #:   832-7006 ° °[place] Resources ° °[facility] [place]  [employer] [place] [facility] °[address]                 [address]         [address]  °[place]                                               [place]                              [place] °Phone:  349-3220                                  Phone:  342-7768                   Phone:  342-8140 ° °[place] [facility], 342-8316 °[place] [facility] - [facility]- 1-888-581-9988 °      -     [facility] in [place], [address],                                  336-349-4454, Insurance ° °[place] Child Abuse Hotline °(336) 342-1394 or (336) 342-3537 (After Hours) ° ° °Behavioral Health Services ° °Substance Abuse Resources: °[facility]  336-882-2125 °[facility] 336-784-9470 °[facility] [place] [facility] 336-285-9073 °[facility] 336-845-3988 °Residential & Outpatient Substance Abuse Program  800-659-3381 ° °Psychological Services: °[facility]  832-9600 °[facility]  378-7881 °[name], [address], [place], ACCESS LINE: 1-800-853-5163 or 336-641-4981, [email or web address].[email or web address] ° °Dental Assistance ° °If unable to pay or uninsured, contact:  [facility] or [name] [facility] to become qualified for the adult dental clinic. ° °Patients with Medicaid: [facility] °[address], 632-0744 °[address], 510-2600 ° °If unable to pay, or uninsured, contact [facility] (271-5999) or [name] [facility] (641-3152 in , 842-7733 in [place]) to become qualified for the adult dental clinic ° °Other Low-Cost   Community Dental Services: °[facility]- [address], [place], [place], [address], 723-1848, [address]. [address], 2nd and 4th Thursday of the month at 6:30am.  10 clients each day by appointment, can sometimes see walk-in patients if someone does not show for an appointment. °[facility]- [address], [place], [place], [address], 723-7904 °[facility]- [address], [place], [place], [address], 631-2330 °[place] [facility]- 342-8273 °[facility]- 703-3100 °[facility]- 570-6415 ° °Please make every effort to establish with a primary care physician for routine medical care ° °Adult Health Services  °The [name] [facility] provides a wide range of adult health services. Some of these services are designed to address the healthcare needs of all [name] residents and all services are designed to meet the needs of uninsured/underinsured low income residents. Some services are available to any resident of [place], call 641-7777 for details. °] °[facility], a new medical clinic for adults, is now open. For more information about the Center and its services please call [phone]. °For information on our Refugee Health services, click here. ° °For more information on any of the following Department of Public Health programs, including hours of service, click on the highlighted link. ° °SERVICES FOR WOMEN (Adults and Teens) °Family Planning Services provide a full range of birth control options plus education and counseling. New patient visit and annual return visits include a complete  examination, pap test as indicated, and other laboratory as indicated. Included is our Regional Vasectomy Program for men. ° °Maternity Care is provided through pregnancy, including a six week post partum exam. Women who meet eligibility criteria for the Medicaid for Pregnant Women program, receive care free. Other women are charged on a sliding scale according to income. °Note: [facility] provides services to pregnant women who have a Medicaid card. Call 641-3152 for an appointment in [place] or [phone] for an appointment in [place]. ° °Primary Care for Medicaid [place] Access Women is available through the [name] [facility]. As primary care provider for the [place] Medicaid [place] Access Medicaid Managed Care program, women may designate the Women’s Health clinic as their primary care provider. ° °PLEASE CALL 641-3245 FOR AN APPOINTMENT FOR THE ABOVE SERVICES IN EITHER [place] OR [place]. Information available in English and Spanish.  ° °Childbirth Education Classes are open to the public and offered to help families prepare for the best possible childbirth experience as well as to promote lifelong health and wellness. Classes are offered throughout the year and meet on the same night once a week for five weeks. Medicaid covers the cost of the classes for the mother-to-be and her partner. For participants without Medicaid, the cost of the class series is $45.00 for the mother-to-be and her partner. Class size is limited and registration is required. For more information or to register call 336-641-4718. Baby items donated by [employer] and the [employer] [place] are given away during each class series. ° °SERVICES FOR WOMEN AND MEN °Sexually Transmitted Infection appointments, including HIV testing, are available daily (weekdays, except holidays). Call early as same-day appointments are limited. For an appointment in either [place] or [place],  call [phone]. Services are confidential and free of charge. ° °Skin Testing for Tuberculosis Please call [phone]. °Adult Immunizations are available, usually for a fee. Please call [phone] for details. ° °PLEASE CALL [phone] FOR AN APPOINTMENT FOR THE ABOVE   SERVICES IN EITHER [place] OR [place].  ° °[facility] provides up to the minute recommended vaccines for your travel destination. We also provide essential health and political information to help insure a safe and pleasurable travel experience. This program is self-sustaining, however, fees are very competitive. We are a CERTIFIED YELLOW FEVER IMMUNIZATION approved clinic site. °PLEASE CALL 641-3245 FOR AN APPOINTMENT IN EITHER [place] OR [place].  ° °If you have questions about the services listed above, we want to answer them! Email us at: [email or web address] °Home Visiting Services for elderly and the disabled are available to residents of [name] who are in need of care that compares to the care offered by a nursing home, have needs that can be met by the program, and have [place] Medicaid. Other short term services are available to residents 18 years and older who are unable to meet requirements for eligibility to receive services from a certified home health agency, spend the majority of time at home, and need care for six months or less. ° °PLEASE CALL [phone] OR [phone] FOR MORE INFORMATION. °Medication Assistance Program serves as a link between pharmaceutical companies and patients to provide low cost or free prescription medications. This servce is available for residents who meet certain income restrictions and have no insurance coverage. ° °PLEASE CALL 641-8030 ([place]) OR [phone] ([place]) FOR MORE INFORMATION.  °Updated Feb. 21, 2013 ° ° °

## 2016-11-23 NOTE — ED Triage Notes (Signed)
Pt brought in by [employer] voluntarily  Pt states he is hearing voices that are telling him to kill other people so he came here to keep from doing it

## 2016-11-24 ENCOUNTER — Encounter (HOSPITAL_COMMUNITY): Payer: Self-pay | Admitting: Emergency Medicine

## 2016-11-24 ENCOUNTER — Emergency Department (HOSPITAL_COMMUNITY)
Admission: EM | Admit: 2016-11-24 | Discharge: 2016-11-25 | Disposition: A | Discharge status: Home / Self Care | Payer: Medicaid Other | Attending: Emergency Medicine | Admitting: Emergency Medicine

## 2016-11-24 ENCOUNTER — Encounter (HOSPITAL_COMMUNITY): Payer: Self-pay | Admitting: *Deleted

## 2016-11-24 DIAGNOSIS — R45850 Homicidal ideations: Secondary | ICD-10-CM | POA: Insufficient documentation

## 2016-11-24 DIAGNOSIS — Z79899 Other long term (current) drug therapy: Secondary | ICD-10-CM

## 2016-11-24 DIAGNOSIS — F17210 Nicotine dependence, cigarettes, uncomplicated: Secondary | ICD-10-CM | POA: Insufficient documentation

## 2016-11-24 DIAGNOSIS — Z765 Malingerer [conscious simulation]: Secondary | ICD-10-CM

## 2016-11-24 DIAGNOSIS — Z888 Allergy status to other drugs, medicaments and biological substances status: Secondary | ICD-10-CM

## 2016-11-24 DIAGNOSIS — F4321 Adjustment disorder with depressed mood: Secondary | ICD-10-CM

## 2016-11-24 DIAGNOSIS — F1094 Alcohol use, unspecified with alcohol-induced mood disorder: Secondary | ICD-10-CM | POA: Diagnosis not present

## 2016-11-24 DIAGNOSIS — Z590 Homelessness: Secondary | ICD-10-CM | POA: Diagnosis not present

## 2016-11-24 DIAGNOSIS — Z8249 Family history of ischemic heart disease and other diseases of the circulatory system: Secondary | ICD-10-CM

## 2016-11-24 DIAGNOSIS — F1014 Alcohol abuse with alcohol-induced mood disorder: Secondary | ICD-10-CM | POA: Insufficient documentation

## 2016-11-24 DIAGNOSIS — R440 Auditory hallucinations: Secondary | ICD-10-CM

## 2016-11-24 MED ORDER — TRAZODONE HCL 50 MG PO TABS: 50.0000 mg | ORAL_TABLET | Freq: Every evening | ORAL | Status: DC | PRN

## 2016-11-24 NOTE — BH Assessment (Addendum)
Tele Assessment Note   Cole HawkingChristopher Adam Barrett is an 30 y.o. single male who [facility] accompanied by [employer] examiner[employer] enforcement. Pt has a diagnosis of schizophrenia and was psychiatrically cleared and discharged [facility] eight hours prior to presenting tonight. Pt has been admitted to the ED eleven times in the past month. Pt reports he called law enforcement because he is experiencing auditory command hallucinations to harm someone. Pt reports [facility] staff told law enforcement to bring Pt to ED. Pt says there is no specific person he is planning to harm but fears he may act on hallucinations and harm someone. He denies any history of violence or aggressive behavior. He denies any visual hallucinations. He denies current suicidal ideation or history of suicide attempts. He denies using any alcohol or drugs since [facility].  Pt states his primary stressor is auditory hallucinations. He denies being homeless and says he lives alone at [address] [place]. He cannot identify any family or friends who are supportive. Pt says he has been off psychiatric medications for months because he cannot afford them. Pt says he has been psychiatrically hospitalized several times.   See recent notes by psychiatry and TTS for additional clinical information.  Pt is dressed in hospital scrubs and has noticeable body odor. He is alert, oriented x4 with soft speech and normal motor behavior. Eye contact is fair. Pt's mood is euthymic and affect is blunted. Thought process is coherent and relevant. Pt says he cannot contract for safety outside a hospital at this time because he may act on homicidal thoughts. He is requesting inpatient psychiatric treatment.  Diagnosis: Schizophrenia  Past Medical History:  Past Medical History:  Diagnosis Date  . Homelessness   . Hypercholesterolemia   . Mental disorder   . Paranoid behavior (HCC)   . PE (pulmonary embolism)   .  Schizophrenia (HCC)     History reviewed. No pertinent surgical history.  Family History:  Family History  Problem Relation Age of Onset  . Hypertension Mother     Social History:  reports that he has been smoking Cigarettes.  He has a 1.00 pack-year smoking history. He has never used smokeless tobacco. He reports that he drinks alcohol. He reports that he uses drugs, including Marijuana.  Additional Social History:  Alcohol / Drug Use Pain Medications: See MAR  Prescriptions: See MAR Over the Counter: See MAR History of alcohol / drug use?: Yes Longest period of sobriety (when/how long): unknown  Substance #1 Name of Substance 1: ETOH 1 - Age of First Use: Teens 1 - Amount (size/oz): 2 beers 1 - Frequency: 4-5 times in a week 1 - Duration: on-going 1 - Last Use / Amount: 11/23/16 Substance #2 Name of Substance 2: Marijuana 2 - Age of First Use: Teens 2 - Amount (size/oz): Varies 2 - Frequency: Once every 3 months 2 - Duration: ongoing 2 - Last Use / Amount: Unknown  CIWA: CIWA-Ar BP: 121/69 Pulse Rate: 81 COWS:    PATIENT STRENGTHS: (choose at least two) Ability for insight Average or above average intelligence Capable of independent living Communication skills General fund of knowledge Motivation for treatment/growth Physical Health  Allergies:  Allergies  Allergen Reactions  . Haldol [Haloperidol] Shortness Of Breath    Home Medications:  (Not in a hospital admission)  OB/GYN Status:  No LMP for male patient.  General Assessment Data Location of Assessment: [facility] ED TTS Assessment: In system Is this a  Tele or Face-to-Face Assessment?: Face-to-Face Is this an Initial Assessment or a Re-assessment for this encounter?: Initial Assessment Marital status: Single Maiden name: NA Is patient pregnant?: No Pregnancy Status: No Living Arrangements: Alone Can pt return to current living arrangement?: Yes Admission Status: Voluntary Is patient capable of  signing voluntary admission?: Yes Referral Source: Self/Family/Friend Insurance type: Medicaid     Crisis Care Plan Living Arrangements: Alone Legal Guardian: Other: (Self) Name of Psychiatrist: None Name of Therapist: None  Education Status Is patient currently in school?: No Current Grade: NA Highest grade of school patient has completed: 12th grade Name of school: NA Contact person: NA  Risk to self with the past 6 months Suicidal Ideation: No Has patient been a risk to self within the past 6 months prior to admission? : No Suicidal Intent: No Has patient had any suicidal intent within the past 6 months prior to admission? : No Is patient at risk for suicide?: No Suicidal Plan?: No Has patient had any suicidal plan within the past 6 months prior to admission? : No Access to Means: No What has been your use of drugs/alcohol within the last 12 months?: Pt denies recent use Previous Attempts/Gestures: No How many times?: 0 Other Self Harm Risks: None Triggers for Past Attempts: None known Intentional Self Injurious Behavior: None Family Suicide History: No Recent stressful life event(s): Other (Comment) (Hearing voices) Persecutory voices/beliefs?: No Depression: No Depression Symptoms:  (Pt denies depressive symptoms) Substance abuse history and/or treatment for substance abuse?: Yes Suicide prevention information given to non-admitted patients: Not applicable  Risk to Others within the past 6 months Homicidal Ideation: Yes-Currently Present Does patient have any lifetime risk of violence toward others beyond the six months prior to admission? : No Thoughts of Harm to Others: Yes-Currently Present Comment - Thoughts of Harm to Others: Pt reports hearing voices telling him to harm people Current Homicidal Intent: No Current Homicidal Plan: No Access to Homicidal Means: No Identified Victim: No specific person. History of harm to others?: No Assessment of Violence:  None Noted Violent Behavior Description: Pt denies history of violence Does patient have access to weapons?: No Criminal Charges Pending?: No Describe Pending Criminal Charges: Pt denies Does patient have a court date: No Court Date:  (None) Is patient on probation?: No  Psychosis Hallucinations: Auditory, With command (Pt reports hearing voice telling him to harm people) Delusions: None noted  Mental Status Report Appearance/Hygiene: Body odor, In scrubs Eye Contact: Fair Motor Activity: Unremarkable Speech: Slow Level of Consciousness: Alert Mood: Euthymic Affect: Blunted Anxiety Level: None Thought Processes: Coherent Judgement: Partial Orientation: Person, Place, Time, Situation, Appropriate for developmental age Obsessive Compulsive Thoughts/Behaviors: None  Cognitive Functioning Concentration: Fair Memory: Recent Intact, Remote Intact IQ: Average Insight: Poor Impulse Control: Fair Appetite: Good Weight Loss: 0 Weight Gain: 0 Sleep: No Change Total Hours of Sleep: 8 Vegetative Symptoms: None  ADLScreening [facility] Assessment Services) Patient's cognitive ability adequate to safely complete daily activities?: Yes Patient able to express need for assistance with ADLs?: Yes Independently performs ADLs?: Yes (appropriate for developmental age)  Prior Inpatient Therapy Prior Inpatient Therapy: Yes Prior Therapy Dates: 2017 Prior [facility] [place] Reason for Treatment: Schizophrenia   Prior Outpatient Therapy Prior Outpatient Therapy: No Prior Therapy Dates: NA Prior Therapy Facilty/Provider(s): NA Reason for Treatment: NA Does patient have an ACCT team?: No Does patient have Intensive In-House Services?  : No Does patient have [facility] services? : No Does patient  have P4CC services?: No  ADL Screening (condition at time of admission) Patient's cognitive ability adequate to safely complete daily activities?: Yes Is  the patient deaf or have difficulty hearing?: No Does the patient have difficulty seeing, even when wearing glasses/contacts?: No Does the patient have difficulty concentrating, remembering, or making decisions?: No Patient able to express need for assistance with ADLs?: Yes Does the patient have difficulty dressing or bathing?: No Independently performs ADLs?: Yes (appropriate for developmental age) Does the patient have difficulty walking or climbing stairs?: No Weakness of Legs: None Weakness of Arms/Hands: None  Home Assistive Devices/Equipment Home Assistive Devices/Equipment: None    Abuse/Neglect Assessment (Assessment to be complete while patient is alone) Physical Abuse: Denies Verbal Abuse: Denies Sexual Abuse: Denies Exploitation of patient/patient's resources: Denies Self-Neglect: Denies     Merchant navy officer (For Healthcare) Does Patient Have a Medical Advance Directive?: No Would patient like information on creating a medical advance directive?: No - Patient declined    Additional Information 1:1 In Past 12 Months?: No CIRT Risk: No Elopement Risk: No Does patient have medical clearance?: Yes     Disposition: Gave clinical report to [name], NP who recommends Pt be observed overnight and evaluated by psychiatry in the morning. Discussed recommendation with Dr. Gerhard Munch who agrees.  Disposition Initial Assessment Completed for this Encounter: Yes Disposition of Patient: Other dispositions Other disposition(s): Other (Comment) (AM psychiatric evaluation)   Pamalee Leyden, [facility], [facility], [facility] Triage Specialist [phone]   Patsy Baltimore, Harlin Rain 11/24/2016 11:54 PM

## 2016-11-24 NOTE — [facility] Counselor (Signed)
Clinician discussed updated disposition with Rashell, RN. [facility] by Tori, AC and assigned to room/bed: 302-2, after 0830. Attending physician: Dr. Jama Flavorsobos. Nursing report: [phone].    Gwinda Passereylese D Bennett, MS, [facility], [facility] Triage Specialist [phone]

## 2016-11-24 NOTE — [facility] Suicide Risk Assessment (Signed)
Suicide Risk Assessment  Discharge Assessment   [facility]   Principal Problem: Alcohol-induced mood disorder [facility](HCC) Discharge Diagnoses:  Patient Active Problem List   Diagnosis Date Noted  . 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 Adjustment disorder with depressed mood [F43.21] 09/07/2016    Priority: High  . Homelessness [Z59.0] 09/03/2016    Priority: High  . Person feigning illness [Z76.5] 09/03/2016    Priority: High  . Alcohol abuse [F10.10]   . Auditory hallucination [R44.0]   . 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 Cannabis use disorder, mild, abuse [F12.10] 08/29/2016  . Paranoid schizophrenia (HCC) [F20.0] 08/26/2016  . Pulmonary emboli (HCC) [I26.99] 07/13/2016  . Pulmonary embolism and infarction (HCC) [I26.99] 01/02/2014  . Acute left flank pain [R10.9] 01/02/2014  . Pulmonary embolism, bilateral (HCC) [I26.99] 01/02/2014    Total Time spent with patient: 45 minutes   Musculoskeletal: Strength & Muscle Tone: within normal limits Gait & Station: normal Patient leans: N/A  Psychiatric Specialty Exam: Physical Exam  Constitutional: He is oriented to person, place, and time. He appears well-developed and well-nourished.  HENT:  Head: Normocephalic.  Neck: Normal range of motion.  Respiratory: Effort normal.  Musculoskeletal: Normal range of motion.  Neurological: He is alert and oriented to person, place, and time.  Psychiatric: His speech is normal and behavior is normal. Judgment and thought content normal. Cognition and memory are normal. He exhibits a depressed mood.    Review of Systems  Psychiatric/Behavioral: Positive for depression.  All other systems reviewed and are negative.   Blood pressure 104/57, pulse 63, temperature 98.1 F (36.7 C), temperature source Oral, resp. rate 20, SpO2 98 %.There is no height or weight on file to calculate BMI.  General Appearance: Casual   Eye Contact:  Good  Speech:  Normal Rate  Volume:  Normal  Mood:  Depressed, mild  Affect:  Congruent  Thought Process:  Coherent and Descriptions of Associations: Intact  Orientation:  Full (Time, Place, and Person)  Thought Content:  WDL  Suicidal Thoughts:  No  Homicidal Thoughts:  No  Memory:  Immediate;   Good Recent;   Good Remote;   Good  Judgement:  Fair  Insight:  Fair  Psychomotor Activity:  Normal  Concentration:  Concentration: Good and Attention Span: Good  Recall:  Good  Fund of Knowledge:  Fair  Language:  Good  Akathisia:  No  Handed:  Right  AIMS (if indicated):     Assets:  Leisure Time Physical Health Resilience  ADL's:  Intact  Cognition:  WNL  Sleep:       Mental Status Per Nursing Assessment::   On Admission:   alcohol abuse with suicidal ideations  Demographic Factors:  Male  Loss Factors: NA  Historical Factors: NA  Risk Reduction Factors:   Sense of responsibility to family  Continued Clinical Symptoms:  Depression, mild  Cognitive Features That Contribute To Risk:  None    Suicide Risk:  Minimal: No identifiable suicidal ideation.  Patients presenting with no risk factors but with morbid ruminations; may be classified as minimal risk based on the severity of the depressive symptoms    Plan Of Care/Follow-up recommendations:  Activity:  as tolerated Diet:  heart healthy diet  Lacrisha Bielicki, NP 11/24/2016, 10:27 AM

## 2016-11-24 NOTE — Consult Note (Signed)
[facility] Consult   Reason for Consult:  Alcohol abuse with hallucinations Referring Physician:  EDP Patient Identification: Cole Barrett MRN:  [id number] Principal Diagnosis: Alcohol-induced mood disorder [facility]) Diagnosis:   Patient Active Problem List   Diagnosis Date Noted  . Alcohol-induced mood disorder ([place]) [F10.94] 09/08/2016    Priority: High  . Adjustment disorder with depressed mood [F43.21] 09/07/2016    Priority: High  . Homelessness [Z59.0] 09/03/2016    Priority: High  . Person feigning illness [Z76.5] 09/03/2016    Priority: High  . Alcohol abuse [F10.10]   . Auditory hallucination [R44.0]   . 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 Cannabis use disorder, mild, abuse [F12.10] 08/29/2016  . Paranoid schizophrenia ([place]) [F20.0] 08/26/2016  . Pulmonary emboli ([place]) [I26.99] 07/13/2016  . Pulmonary embolism and infarction ([place]) [I26.99] 01/02/2014  . Acute left flank pain [R10.9] 01/02/2014  . Pulmonary embolism, bilateral ([place]) [I26.99] 01/02/2014    Total Time spent with patient: 45 minutes  Subjective:   Cole Barrett is a 30 y.o. male patient who states "I'm feeling good, cops brought me here."  HPI:  30 yo male who presented to the ED after drinking and expressing auditory hallucinations.  Today, he is clear and coherent with no suicidal/homicidal ideations, hallucinations, or withdrawal symptoms.  He is homeless and wants rehab for approximately a year.  Stable for discharge with outpatient resources and rehab information/referral.    Past Psychiatric History: depression, substance abuse  Risk to Self: Suicidal Ideation: No (Pt denies.) Suicidal Intent: No Is patient at risk for suicide?: No Suicidal Plan?: No Access to Means: No What has been your use of drugs/alcohol within the last 12 months?: Pt's UDS is negative.  How many times?: 0 Other Self Harm Risks: NA Triggers for Past Attempts: None  known Intentional Self Injurious Behavior: None (Pt denies.) Risk to Others: Homicidal Ideation: No (Pt denies. ) Thoughts of Harm to Others: No Comment - Thoughts of Harm to Others: NA Current Homicidal Intent: No Current Homicidal Plan: No Access to Homicidal Means: No Identified Victim: NA History of harm to others?: No Assessment of Violence: None Noted Violent Behavior Description: NA Does patient have access to weapons?: No (Pt denies. ) Criminal Charges Pending?: No Describe Pending Criminal Charges: Pt denies.  Does patient have a court date: No Court Date:  (Pt denies. ) Prior Inpatient Therapy: Prior Inpatient Therapy: Yes Prior Therapy Dates: 2017 Prior Therapy Facilty/Provider(s): [facility] & facility in [facility] Reason for Treatment: Schizophrenia  Prior Outpatient Therapy: Prior Outpatient Therapy: No Prior Therapy Dates: NA Prior Therapy Facilty/Provider(s): NA Reason for Treatment: NA Does patient have an ACCT team?: No Does patient have Intensive In-House Services?  : No Does patient have [facility] services? : No Does patient have P4CC services?: No  Past Medical History:  Past Medical History:  Diagnosis Date  . Homelessness   . Hypercholesterolemia   . Mental disorder   . Paranoid behavior ([place])   . PE (pulmonary embolism)   . Schizophrenia ([place])    History reviewed. No pertinent surgical history. Family History:  Family History  Problem Relation Age of Onset  . Hypertension Mother    Family Psychiatric  History: none Social History:  History  Alcohol Use  . Yes     History  Drug Use  . Types: Marijuana    Social History   Social History  . Marital status: Single    Spouse name: N/A  . Number of  children: N/A  . Years of education: N/A   Social History Main Topics  . Smoking status: Current Every Day Smoker    Packs/day: 0.25    Years: 4.00    Types: Cigarettes  . Smokeless tobacco: Never Used  . Alcohol use Yes  . Drug use:      Types: Marijuana  . Sexual activity: No   Other Topics Concern  . None   Social History Narrative  . None   Additional Social History:    Allergies:   Allergies  Allergen Reactions  . Haldol [Haloperidol] Shortness Of Breath    Labs:  Results for orders placed or performed during the hospital encounter of 11/23/16 (from the past 48 hour(s))  Comprehensive metabolic panel     Status: Abnormal   Collection Time: 11/23/16  9:52 PM  Result Value Ref Range   Sodium 139 135 - 145 mmol/L   Potassium 3.8 3.5 - 5.1 mmol/L   Chloride 105 101 - 111 mmol/L   CO2 26 22 - 32 mmol/L   Glucose, Bld 93 65 - 99 mg/dL   BUN 14 6 - 20 mg/dL   Creatinine, Ser 1.02 0.61 - 1.24 mg/dL   Calcium 8.8 (L) 8.9 - 10.3 mg/dL   Total Protein 7.8 6.5 - 8.1 g/dL   Albumin 4.1 3.5 - 5.0 g/dL   AST 33 15 - 41 U/L   ALT 36 17 - 63 U/L   Alkaline Phosphatase 53 38 - 126 U/L   Total Bilirubin 0.3 0.3 - 1.2 mg/dL   GFR calc non Af Amer >60 >60 mL/min   GFR calc Af Amer >60 >60 mL/min    Comment: (NOTE) The eGFR has been calculated using the CKD EPI equation. This calculation has not been validated in all clinical situations. eGFR's persistently <60 mL/min signify possible Chronic Kidney Disease.    Anion gap 8 5 - 15  Ethanol     Status: None   Collection Time: 11/23/16  9:52 PM  Result Value Ref Range   Alcohol, Ethyl (B) <5 <5 mg/dL    Comment:        LOWEST DETECTABLE LIMIT FOR SERUM ALCOHOL IS 5 mg/dL FOR MEDICAL PURPOSES ONLY   cbc     Status: Abnormal   Collection Time: 11/23/16  9:52 PM  Result Value Ref Range   WBC 5.2 4.0 - 10.5 K/uL   RBC 4.68 4.22 - 5.81 MIL/uL   Hemoglobin 12.5 (L) 13.0 - 17.0 g/dL   HCT 37.6 (L) 39.0 - 52.0 %   MCV 80.3 78.0 - 100.0 fL   MCH 26.7 26.0 - 34.0 pg   MCHC 33.2 30.0 - 36.0 g/dL   RDW 13.8 11.5 - 15.5 %   Platelets 318 150 - 400 K/uL  Rapid urine drug screen (hospital performed)     Status: None   Collection Time: 11/23/16 10:26 PM  Result Value  Ref Range   Opiates NONE DETECTED NONE DETECTED   Cocaine NONE DETECTED NONE DETECTED   Benzodiazepines NONE DETECTED NONE DETECTED   Amphetamines NONE DETECTED NONE DETECTED   Tetrahydrocannabinol NONE DETECTED NONE DETECTED   Barbiturates NONE DETECTED NONE DETECTED    Comment:        DRUG SCREEN FOR MEDICAL PURPOSES ONLY.  IF CONFIRMATION IS NEEDED FOR ANY PURPOSE, NOTIFY LAB WITHIN 5 DAYS.        LOWEST DETECTABLE LIMITS FOR URINE DRUG SCREEN Drug Class       Cutoff (ng/mL) Amphetamine  1000 Barbiturate      200 Benzodiazepine   161 Tricyclics       096 Opiates          300 Cocaine          300 THC              50     Current Facility-Administered Medications  Medication Dose Route Frequency Provider Last Rate Last Dose  . traZODone (DESYREL) tablet 50-100 mg  50-100 mg Oral QHS PRN Ozella Almond Ward, PA-C       Current Outpatient Prescriptions  Medication Sig Dispense Refill  . acetaminophen (TYLENOL) 500 MG tablet Take 1 tablet (500 mg total) by mouth every 6 (six) hours as needed. (Patient taking differently: Take 500 mg by mouth every 6 (six) hours as needed for mild pain, moderate pain or headache. ) 30 tablet 0  . benztropine (COGENTIN) 0.5 MG tablet Take 1 tablet (0.5 mg total) by mouth 2 (two) times daily. 60 tablet 0  . benztropine (COGENTIN) 1 MG tablet Take 0.5 tablets (0.5 mg total) by mouth 2 (two) times daily. 5 tablet 0  . Diclofenac 35 MG CAPS Take 35 mg by mouth 3 (three) times daily. 90 capsule 0  . pravastatin (PRAVACHOL) 20 MG tablet Take 1 tablet (20 mg total) by mouth daily. 30 tablet 0  . risperiDONE (RISPERDAL) 1 MG tablet Take 1 tablet (1 mg total) by mouth every morning. 30 tablet 0  . risperiDONE (RISPERDAL) 1 MG tablet Take 1 tablet (65m) PO every morning and 3 tablets (3107m PO QHS 40 tablet 0  . risperiDONE (RISPERDAL) 3 MG tablet Take 1 tablet (3 mg total) by mouth at bedtime. 30 tablet 0  . traZODone (DESYREL) 50 MG tablet Take 1-2  tablets (50-1008mPO at bedtime PRN sleep 20 tablet 0  . traZODone (DESYREL) 50 MG tablet Take 1-2 tablets (50-100 mg total) by mouth at bedtime as needed for sleep. 60 tablet 0  . rivaroxaban (XARELTO) 20 MG TABS tablet Take 1 tablet (20 mg total) by mouth daily with supper. 30 tablet 1    Musculoskeletal: Strength & Muscle Tone: within normal limits Gait & Station: normal Patient leans: N/A  Psychiatric Specialty Exam: Physical Exam  Constitutional: He is oriented to person, place, and time. He appears well-developed and well-nourished.  HENT:  Head: Normocephalic.  Neck: Normal range of motion.  Respiratory: Effort normal.  Musculoskeletal: Normal range of motion.  Neurological: He is alert and oriented to person, place, and time.  Psychiatric: His speech is normal and behavior is normal. Judgment and thought content normal. Cognition and memory are normal. He exhibits a depressed mood.    Review of Systems  Psychiatric/Behavioral: Positive for depression.  All other systems reviewed and are negative.   Blood pressure 104/57, pulse 63, temperature 98.1 F (36.7 C), temperature source Oral, resp. rate 20, SpO2 98 %.There is no height or weight on file to calculate BMI.  General Appearance: Casual  Eye Contact:  Good  Speech:  Normal Rate  Volume:  Normal  Mood:  Depressed, mild  Affect:  Congruent  Thought Process:  Coherent and Descriptions of Associations: Intact  Orientation:  Full (Time, Place, and Person)  Thought Content:  WDL  Suicidal Thoughts:  No  Homicidal Thoughts:  No  Memory:  Immediate;   Good Recent;   Good Remote;   Good  Judgement:  Fair  Insight:  Fair  Psychomotor Activity:  Normal  Concentration:  Concentration:  Good and Attention Span: Good  Recall:  Good  Fund of Knowledge:  Fair  Language:  Good  Akathisia:  No  Handed:  Right  AIMS (if indicated):     Assets:  Leisure Time Physical Health Resilience  ADL's:  Intact  Cognition:  WNL   Sleep:        Treatment Plan Summary: Daily contact with patient to assess and evaluate symptoms and progress in treatment, Medication management and Plan alcohol abuse with alcohol induced mood disorder:  -Crisis stabilization -Medication management:  Trazodone 100 mg at bedtime for sleep PRN -Individual counseling  Disposition: No evidence of imminent risk to self or others at present.    Waylan Boga, NP 11/24/2016 10:18 AM

## 2016-11-24 NOTE — ED Notes (Signed)
Patient educated about search process and term "contraband " and routine search performed. No contraband found. 

## 2016-11-24 NOTE — ED Notes (Signed)
Pt denies plan for harming self or others.

## 2016-11-24 NOTE — ED Notes (Signed)
[place], TTS, in with pt.

## 2016-11-24 NOTE — BH Assessment (Addendum)
Tele Assessment Note   Cole Barrett is an 30 y.o. male, who presents voluntarily and unaccompanied to [facility].  Pt reported, he's been hearing a male voice telling him to kill himself, for about a month or two. Clinician had to prompt the pt  Many times to re-engage in the assessment. Pt was a poor historian during the assessment. Pt denied SI, HI, and self-injurious behaviors.   Per Asher Muir, [place] note: "Cole Barrett is a 30 y.o. male brought in by [employer] voluntarily who presents to the Emergency Department complaining of auditory hallucinations. Pt states voices are telling him to hurt others, and these voices also tell him that they will hurt him. He denies suicidal ideations. He states that although voices tell him to hurt people he does not want to hurt anyone. He believes he needs long term treatment for his symptoms so that he will not act on the voices. Denies visual hallucinations. Patient states that he is suppose to be on medication for auditory hallucinations, however has been out for 1-2 months because he does not have the money to afford prescriptions."   Pt denied verbal, physical and sexual abuse. Pt denied substance usage. Pt's has a BAL of 75 at 0111. Pt denied being linked to medication management and counseling.   Pt presents sleeping in scrubs with slurred, slow and soft speech. Pt's eye contact was poor. Pt's mood was tired. Pt's affect was flat. Pt's thought process was relevant. Pt's concentration and insight are poor. Pt's impulse control is fair. Pt reported, of discharged from [facility] he could contract for safety.   Diagnosis: Schizophrenia [facility])  Past Medical History:  Past Medical History:  Diagnosis Date  . Homelessness   . Hypercholesterolemia   . Mental disorder   . Paranoid behavior (HCC)   . PE (pulmonary embolism)   . Schizophrenia (HCC)     History reviewed. No pertinent surgical history.  Family History:  Family History  Problem Relation Age  of Onset  . Hypertension Mother     Social History:  reports that he has been smoking Cigarettes.  He has a 1.00 pack-year smoking history. He has never used smokeless tobacco. He reports that he drinks alcohol. He reports that he uses drugs, including Marijuana.  Additional Social History:  Alcohol / Drug Use Pain Medications: See MAR  Prescriptions: See MAR Over the Counter: See MAR History of alcohol / drug use?: No history of alcohol / drug abuse  CIWA: CIWA-Ar BP: 132/75 Pulse Rate: 67 COWS:    PATIENT STRENGTHS: (choose at least two) Average or above average intelligence General fund of knowledge  Allergies:  Allergies  Allergen Reactions  . Haldol [Haloperidol] Shortness Of Breath    Home Medications:  (Not in a hospital admission)  OB/GYN Status:  No LMP for male patient.  General Assessment Data Location of Assessment: [facility] ED TTS Assessment: In system Is this a Tele or Face-to-Face Assessment?: Face-to-Face Is this an Initial Assessment or a Re-assessment for this encounter?: Initial Assessment Marital status: Single Maiden name: NA Is patient pregnant?: No Pregnancy Status: No Living Arrangements: Alone Can pt return to current living arrangement?: Yes Admission Status: Voluntary Is patient capable of signing voluntary admission?: Yes Referral Source: Self/Family/Friend Insurance type: Medicaid     Crisis Care Plan Living Arrangements: Alone Legal Guardian: Other: (Self) Name of Psychiatrist: NA Name of Therapist: NA  Education Status Is patient currently in school?: No Current Grade: NA Highest grade of school patient has completed:  12th grade Name of school: NA Contact person: NA  Risk to self with the past 6 months Suicidal Ideation: No (Pt denies.) Has patient been a risk to self within the past 6 months prior to admission? : No Suicidal Intent: No Has patient had any suicidal intent within the past 6 months prior to admission? : No Is  patient at risk for suicide?: No Suicidal Plan?: No Has patient had any suicidal plan within the past 6 months prior to admission? : No Access to Means: No What has been your use of drugs/alcohol within the last 12 months?: Pt's UDS is negative.  Previous Attempts/Gestures: No How many times?: 0 Other Self Harm Risks: NA Triggers for Past Attempts: None known Intentional Self Injurious Behavior: None (Pt denies.) Family Suicide History: No Recent stressful life event(s): Other (Comment) (UTA) Persecutory voices/beliefs?: No Depression: No Substance abuse history and/or treatment for substance abuse?: No Suicide prevention information given to non-admitted patients: Not applicable  Risk to Others within the past 6 months Homicidal Ideation: No (Pt denies. ) Does patient have any lifetime risk of violence toward others beyond the six months prior to admission? : No Thoughts of Harm to Others: No Comment - Thoughts of Harm to Others: NA Current Homicidal Intent: No Current Homicidal Plan: No Access to Homicidal Means: No Identified Victim: NA History of harm to others?: No Assessment of Violence: None Noted Violent Behavior Description: NA Does patient have access to weapons?: No (Pt denies. ) Criminal Charges Pending?: No Describe Pending Criminal Charges: Pt denies.  Does patient have a court date: No Court Date:  (Pt denies. ) Is patient on probation?: No  Psychosis Hallucinations: Auditory Delusions: None noted  Mental Status Report Appearance/Hygiene: In scrubs Eye Contact: Poor Motor Activity: Unremarkable Speech: Slurred, Soft, Slow Level of Consciousness: Sleeping Mood:  (tired) Affect: Flat Anxiety Level: None Thought Processes: Relevant Judgement: Partial Orientation: Other (Comment) (year, city and state.) Obsessive Compulsive Thoughts/Behaviors: Unable to Assess  Cognitive Functioning Concentration: Poor Memory: Recent Intact IQ: Average Insight:  Poor Impulse Control: Fair Appetite: Good Weight Loss: 0 Weight Gain: 0 Sleep:  (Pt reported, I don't know. ) Total Hours of Sleep:  (Pt reported, I don't know.) Vegetative Symptoms: None     Prior Inpatient Therapy Prior Inpatient Therapy: Yes Prior Therapy Dates: 2017 [facility] [place] Reason for Treatment: Schizophrenia   Prior Outpatient Therapy Prior Outpatient Therapy: No Prior Therapy Dates: NA Prior Therapy Facilty/Provider(s): NA Reason for Treatment: NA Does patient have an ACCT team?: No Does patient have Intensive In-House Services?  : No Does patient have [facility] services? : No Does patient have P4CC services?: No  ADL Screening (condition at time of admission) Is the patient deaf or have difficulty hearing?: No Does the patient have difficulty seeing, even when wearing glasses/contacts?: No Does the patient have difficulty concentrating, remembering, or making decisions?: No Does the patient have difficulty dressing or bathing?: No Does the patient have difficulty walking or climbing stairs?: No Weakness of Legs: None Weakness of Arms/Hands: None       Abuse/Neglect Assessment (Assessment to be complete while patient is alone) Physical Abuse: Denies (Pt denies. ) Verbal Abuse: Denies (Pt denies. ) Sexual Abuse: Denies (Pt denies.)     Advance Directives (For Healthcare) Does Patient Have a Medical Advance Directive?: No    Additional Information 1:1 In Past 12 Months?: No CIRT Risk: No Elopement Risk: No Does patient have medical clearance?: Yes  Disposition: Donell SievertSpencer Simon, PA recommends AM Psychiatric Evaluation. Disposition discussed with Asher MuirJamie, PA and Misty StanleyLisa, RN.  Disposition Initial Assessment Completed for this Encounter: Yes Disposition of Patient: Other dispositions (AM Psychiatric Evaluation) Other disposition(s): Other (Comment) (AM Psychiatric Evaluation)  Gwinda Passereylese D  Bennett 11/24/2016 12:56 AM   Gwinda Passereylese D Bennett, MS, [facility], [facility] Triage Specialist [phone]

## 2016-11-24 NOTE — ED Notes (Signed)
Pt discharged home per MD order. Discharge summary reviewed with pt. Pt verbalizes understanding of discharge summary. Pt denies SI/HI/AVH at discharge. Pt signed for personal property and property returned. Pt signed e-signature. Pt ambulatory off unit with [facility].

## 2016-11-24 NOTE — ED Triage Notes (Addendum)
Pt stated "I got some real money coming to me.  I don't have any real money to get the medicine.  [facility] months ago.  I called the police tonight, they called [facility] and they told the police to bring me here.  I just need a doctor to talk to me to get me straightened out.  [facility]  I need to go somewhere they will help me.  I keep hearing voices telling me they're going to kill me."

## 2016-11-24 NOTE — ED Notes (Signed)
TTS at bedside. 

## 2016-11-24 NOTE — ED Notes (Signed)
Patient admits to SI, HI and AH but denies VH. Plan of care discussed with patient. Patient voices no complaints or concerns at this time. Encouragement and support provided and safety maintain. Q 15 min safety checks in place and video monitoring.

## 2016-11-25 DIAGNOSIS — F1094 Alcohol use, unspecified with alcohol-induced mood disorder: Secondary | ICD-10-CM

## 2016-11-25 DIAGNOSIS — R440 Auditory hallucinations: Secondary | ICD-10-CM

## 2016-11-25 DIAGNOSIS — Z79899 Other long term (current) drug therapy: Secondary | ICD-10-CM

## 2016-11-25 DIAGNOSIS — F4321 Adjustment disorder with depressed mood: Secondary | ICD-10-CM

## 2016-11-25 DIAGNOSIS — F17210 Nicotine dependence, cigarettes, uncomplicated: Secondary | ICD-10-CM

## 2016-11-25 DIAGNOSIS — Z765 Malingerer [conscious simulation]: Secondary | ICD-10-CM

## 2016-11-25 DIAGNOSIS — Z590 Homelessness: Secondary | ICD-10-CM | POA: Diagnosis not present

## 2016-11-25 DIAGNOSIS — Z8249 Family history of ischemic heart disease and other diseases of the circulatory system: Secondary | ICD-10-CM

## 2016-11-25 DIAGNOSIS — Z888 Allergy status to other drugs, medicaments and biological substances status: Secondary | ICD-10-CM

## 2016-11-25 MED ORDER — TRAZODONE HCL 50 MG PO TABS
50.0000 mg | ORAL_TABLET | Freq: Every evening | ORAL | Status: DC | PRN
  Administered 2016-11-25: 50 mg via ORAL
  Filled 2016-11-25: qty 1

## 2016-11-25 MED ORDER — BENZTROPINE MESYLATE 0.5 MG PO TABS
0.5000 mg | ORAL_TABLET | Freq: Two times a day (BID) | ORAL | Status: DC
  Administered 2016-11-25 (×2): 0.5 mg via ORAL
  Filled 2016-11-25 (×2): qty 1

## 2016-11-25 MED ORDER — RIVAROXABAN 20 MG PO TABS: 20.0000 mg | ORAL_TABLET | Freq: Every day | ORAL | Status: DC

## 2016-11-25 MED ORDER — RISPERIDONE 1 MG PO TABS
3.0000 mg | ORAL_TABLET | Freq: Every day | ORAL | Status: DC
  Administered 2016-11-25: 3 mg via ORAL
  Filled 2016-11-25: qty 1

## 2016-11-25 MED ORDER — RISPERIDONE 1 MG PO TABS
1.0000 mg | ORAL_TABLET | Freq: Two times a day (BID) | ORAL | Status: DC
  Administered 2016-11-25: 1 mg via ORAL
  Filled 2016-11-25: qty 1

## 2016-11-25 MED ORDER — RISPERIDONE 1 MG PO TABS
1.0000 mg | ORAL_TABLET | Freq: Every morning | ORAL | Status: DC
  Administered 2016-11-25: 1 mg via ORAL
  Filled 2016-11-25: qty 1

## 2016-11-25 NOTE — ED Notes (Signed)
Pt discharged home. Discharged instructions read to pt who verbalized understanding. All belongings returned to pt who signed for same. Denies SI/HI, is not delusional and not responding to internal stimuli. Escorted pt to the ED exit.    

## 2016-11-25 NOTE — Discharge Instructions (Signed)
To help you maintain a sober lifestyle, a substance abuse treatment program may be beneficial to you.  Contact one of the following facilities at your earliest opportunity to ask about enrolling: ° °RESIDENTIAL PROGRAMS: ° °     [facility] °     [address] °     [place], [place] [address] °     [phone] ° °     [facility] °     [address] °     [place], [place] [address] °     [phone] ° °     [facility] °     [address] °     [place], [place] [address] °     [phone] ° °OUTPATIENT PROGRAMS: ° °     [facility] °     [address] °     [place],  [address] °     [phone] °     New patients are seen at the walk-in clinic every Tuesday from 9:00 am - 12:00 pm. °

## 2016-11-25 NOTE — Consult Note (Signed)
[facility] Psychiatry Consult   Reason for Consult:  Alcohol abuse with hallucinations Referring Physician:  EDP Patient Identification: Cole Barrett MRN:  [id number] Principal Diagnosis: Adjustment disorder with depressed mood Diagnosis:   Patient Active Problem List   Diagnosis Date Noted  . Alcohol-induced mood disorder ([place]) [F10.94] 09/08/2016    Priority: High  . Adjustment disorder with depressed mood [F43.21] 09/07/2016    Priority: High  . Homelessness [Z59.0] 09/03/2016    Priority: High  . Person feigning illness [Z76.5] 09/03/2016    Priority: High  . Alcohol abuse [F10.10]   . Auditory hallucination [R44.0]   . 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 Cannabis use disorder, mild, abuse [F12.10] 08/29/2016  . Paranoid schizophrenia ([place]) [F20.0] 08/26/2016  . Pulmonary emboli ([place]) [I26.99] 07/13/2016  . Pulmonary embolism and infarction ([place]) [I26.99] 01/02/2014  . Acute left flank pain [R10.9] 01/02/2014  . Pulmonary embolism, bilateral ([place]) [I26.99] 01/02/2014    Total Time spent with patient: 45 minutes  Subjective:   Cole Barrett is a 30 y.o. male patient who states "I don't know why I am here, I wanted to go to [place]."  HPI:  30 yo male who presented to the ED after drinking and expressing auditory hallucinations.  Today, he is clear and coherent with no suicidal/homicidal ideations, hallucinations, or withdrawal symptoms.  He is homeless and wants rehab for approximately a year.  Stable for discharge with outpatient resources and rehab information/referral.    Past Psychiatric History: depression, substance abuse  Risk to Self: Suicidal Ideation: No Suicidal Intent: No Is patient at risk for suicide?: No Suicidal Plan?: No Access to Means: No What has been your use of drugs/alcohol within the last 12 months?: Pt denies recent use How many times?: 0 Other Self Harm Risks: None Triggers for Past Attempts: None  known Intentional Self Injurious Behavior: None Risk to Others:None Prior Inpatient Therapy: Prior Inpatient Therapy: Yes Prior Therapy Dates: 2017 Prior Therapy Facilty/Provider(s): [facility] in [facility] Reason for Treatment: Schizophrenia  Prior Outpatient Therapy: Prior Outpatient Therapy: No Prior Therapy Dates: NA Prior Therapy Facilty/Provider(s): NA Reason for Treatment: NA Does patient have an ACCT team?: No Does patient have Intensive In-House Services?  : No Does patient have [facility] services? : No Does patient have P4CC services?: No  Past Medical History:  Past Medical History:  Diagnosis Date  . Homelessness   . Hypercholesterolemia   . Mental disorder   . Paranoid behavior ([place])   . PE (pulmonary embolism)   . Schizophrenia ([place])    History reviewed. No pertinent surgical history. Family History:  Family History  Problem Relation Age of Onset  . Hypertension Mother    Family Psychiatric  History: none Social History:  History  Alcohol Use  . Yes     History  Drug Use  . Types: Marijuana    Social History   Social History  . Marital status: Single    Spouse name: N/A  . Number of children: N/A  . Years of education: N/A   Social History Main Topics  . Smoking status: Current Every Day Smoker    Packs/day: 0.25    Years: 4.00    Types: Cigarettes  . Smokeless tobacco: Never Used  . Alcohol use Yes  . Drug use:     Types: Marijuana  . Sexual activity: No   Other Topics Concern  . None   Social History Narrative  . None   Additional Social  History:    Allergies:   Allergies  Allergen Reactions  . Haldol [Haloperidol] Shortness Of Breath    Labs:  Results for orders placed or performed during the hospital encounter of 11/23/16 (from the past 48 hour(s))  Comprehensive metabolic panel     Status: Abnormal   Collection Time: 11/23/16  9:52 PM  Result Value Ref Range   Sodium 139 135 - 145 mmol/L   Potassium 3.8 3.5 -  5.1 mmol/L   Chloride 105 101 - 111 mmol/L   CO2 26 22 - 32 mmol/L   Glucose, Bld 93 65 - 99 mg/dL   BUN 14 6 - 20 mg/dL   Creatinine, Ser 1.02 0.61 - 1.24 mg/dL   Calcium 8.8 (L) 8.9 - 10.3 mg/dL   Total Protein 7.8 6.5 - 8.1 g/dL   Albumin 4.1 3.5 - 5.0 g/dL   AST 33 15 - 41 U/L   ALT 36 17 - 63 U/L   Alkaline Phosphatase 53 38 - 126 U/L   Total Bilirubin 0.3 0.3 - 1.2 mg/dL   GFR calc non Af Amer >60 >60 mL/min   GFR calc Af Amer >60 >60 mL/min    Comment: (NOTE) The eGFR has been calculated using the CKD EPI equation. This calculation has not been validated in all clinical situations. eGFR's persistently <60 mL/min signify possible Chronic Kidney Disease.    Anion gap 8 5 - 15  Ethanol     Status: None   Collection Time: 11/23/16  9:52 PM  Result Value Ref Range   Alcohol, Ethyl (B) <5 <5 mg/dL    Comment:        LOWEST DETECTABLE LIMIT FOR SERUM ALCOHOL IS 5 mg/dL FOR MEDICAL PURPOSES ONLY   cbc     Status: Abnormal   Collection Time: 11/23/16  9:52 PM  Result Value Ref Range   WBC 5.2 4.0 - 10.5 K/uL   RBC 4.68 4.22 - 5.81 MIL/uL   Hemoglobin 12.5 (L) 13.0 - 17.0 g/dL   HCT 37.6 (L) 39.0 - 52.0 %   MCV 80.3 78.0 - 100.0 fL   MCH 26.7 26.0 - 34.0 pg   MCHC 33.2 30.0 - 36.0 g/dL   RDW 13.8 11.5 - 15.5 %   Platelets 318 150 - 400 K/uL  Rapid urine drug screen (hospital performed)     Status: None   Collection Time: 11/23/16 10:26 PM  Result Value Ref Range   Opiates NONE DETECTED NONE DETECTED   Cocaine NONE DETECTED NONE DETECTED   Benzodiazepines NONE DETECTED NONE DETECTED   Amphetamines NONE DETECTED NONE DETECTED   Tetrahydrocannabinol NONE DETECTED NONE DETECTED   Barbiturates NONE DETECTED NONE DETECTED    Comment:        DRUG SCREEN FOR MEDICAL PURPOSES ONLY.  IF CONFIRMATION IS NEEDED FOR ANY PURPOSE, NOTIFY LAB WITHIN 5 DAYS.        LOWEST DETECTABLE LIMITS FOR URINE DRUG SCREEN Drug Class       Cutoff (ng/mL) Amphetamine       1000 Barbiturate      200 Benzodiazepine   938 Tricyclics       182 Opiates          300 Cocaine          300 THC              50     Current Facility-Administered Medications  Medication Dose Route Frequency Provider Last Rate Last Dose  . benztropine (COGENTIN) tablet  0.5 mg  0.5 mg Oral BID Carmin Muskrat, MD   0.5 mg at 11/25/16 0133  . risperiDONE (RISPERDAL) tablet 1 mg  1 mg Oral q morning - 10a Carmin Muskrat, MD      . risperiDONE (RISPERDAL) tablet 1 mg  1 mg Oral BID Carmin Muskrat, MD      . risperiDONE (RISPERDAL) tablet 3 mg  3 mg Oral QHS Carmin Muskrat, MD   3 mg at 11/25/16 0133  . rivaroxaban (XARELTO) tablet 20 mg  20 mg Oral Q supper Carmin Muskrat, MD      . traZODone (DESYREL) tablet 50 mg  50 mg Oral QHS PRN Carmin Muskrat, MD   50 mg at 11/25/16 0134   Current Outpatient Prescriptions  Medication Sig Dispense Refill  . acetaminophen (TYLENOL) 500 MG tablet Take 1 tablet (500 mg total) by mouth every 6 (six) hours as needed. (Patient taking differently: Take 500 mg by mouth every 6 (six) hours as needed for mild pain, moderate pain or headache. ) 30 tablet 0  . benztropine (COGENTIN) 0.5 MG tablet Take 1 tablet (0.5 mg total) by mouth 2 (two) times daily. 60 tablet 0  . benztropine (COGENTIN) 1 MG tablet Take 0.5 tablets (0.5 mg total) by mouth 2 (two) times daily. 5 tablet 0  . Diclofenac 35 MG CAPS Take 35 mg by mouth 3 (three) times daily. 90 capsule 0  . pravastatin (PRAVACHOL) 20 MG tablet Take 1 tablet (20 mg total) by mouth daily. 30 tablet 0  . risperiDONE (RISPERDAL) 1 MG tablet Take 1 tablet (1 mg total) by mouth every morning. 30 tablet 0  . risperiDONE (RISPERDAL) 1 MG tablet Take 1 tablet ('1mg'$ ) PO every morning and 3 tablets ('3mg'$ ) PO QHS 40 tablet 0  . risperiDONE (RISPERDAL) 3 MG tablet Take 1 tablet (3 mg total) by mouth at bedtime. 30 tablet 0  . rivaroxaban (XARELTO) 20 MG TABS tablet Take 1 tablet (20 mg total) by mouth daily with supper. 30  tablet 1  . traZODone (DESYREL) 50 MG tablet Take 1-2 tablets (50-'100mg'$ ) PO at bedtime PRN sleep 20 tablet 0    Musculoskeletal: Strength & Muscle Tone: within normal limits Gait & Station: normal Patient leans: N/A  Psychiatric Specialty Exam: Physical Exam  Constitutional: He is oriented to person, place, and time. He appears well-developed and well-nourished.  HENT:  Head: Normocephalic.  Neck: Normal range of motion.  Respiratory: Effort normal.  Musculoskeletal: Normal range of motion.  Neurological: He is alert and oriented to person, place, and time.  Psychiatric: He has a normal mood and affect. His speech is normal and behavior is normal. Judgment and thought content normal. Cognition and memory are normal.    Review of Systems  Psychiatric/Behavioral: Positive for substance abuse.  All other systems reviewed and are negative.   Blood pressure 124/56, pulse 67, temperature 98 F (36.7 C), temperature source Oral, resp. rate 18, height '5\' 10"'$  (1.778 m), weight 99.8 kg (220 lb), SpO2 100 %.Body mass index is 31.57 kg/m.  General Appearance: Casual  Eye Contact:  Good  Speech:  Normal Rate  Volume:  Normal  Mood:  Euthymic  Affect:  Congruent  Thought Process:  Coherent and Descriptions of Associations: Intact  Orientation:  Full (Time, Place, and Person)  Thought Content:  WDL  Suicidal Thoughts:  No  Homicidal Thoughts:  No  Memory:  Immediate;   Good Recent;   Good Remote;   Good  Judgement:  Fair  Insight:  Fair  Psychomotor Activity:  Normal  Concentration:  Concentration: Good and Attention Span: Good  Recall:  Good  Fund of Knowledge:  Fair  Language:  Good  Akathisia:  No  Handed:  Right  AIMS (if indicated):     Assets:  Leisure Time Physical Health Resilience  ADL's:  Intact  Cognition:  WNL  Sleep:        Treatment Plan Summary: Daily contact with patient to assess and evaluate symptoms and progress in treatment, Medication management and  Plan alcohol abuse with alcohol induced mood disorder:  -Crisis stabilization -Medication management:  Trazodone 50 mg at bedtime for sleep PRN, Risperdal 1 mg BID and 3 mg at bedtime for psychosis, Cogentin 0.5 mg BID for EPS, and -Individual and substance abuse counseling -Outpatient resources provided  Disposition: No evidence of imminent risk to self or others at present.    Waylan Boga, NP 11/25/2016 8:37 AM  Patient seen face-to-face for psychiatric evaluation, chart reviewed and case discussed with the physician extender and developed treatment plan. Reviewed the information documented and agree with the treatment plan. Corena Pilgrim, MD

## 2016-11-25 NOTE — ED Provider Notes (Signed)
[facility]-EMERGENCY DEPT Provider Note   CSN: [id number] Arrival date & time: 11/24/16  2212     History   Chief Complaint Chief Complaint  Patient presents with  . Hallucinations    auditory    HPI Cole Barrett is a 30 y.o. male.  HPI  Patient presents with concerns of increasingly severe auditory hallucinations. He acknowledges having been seen here multiple times over the past week, and 3 times over the past 2 days, states that although he has been advised to seek outpatient therapy has been unsuccessful at doing so, and each facility refers him here for. He states that he has not been able to take any medication as directed, and has increasingly frequent, and loud auditory hallucinations, voices telling him to kill other people. He denies physical pain or other complaints, states that since he cannot take any medicine, it is unclear if there is anything that helps his symptoms.   Past Medical History:  Diagnosis Date  . Homelessness   . Hypercholesterolemia   . Mental disorder   . Paranoid behavior (HCC)   . PE (pulmonary embolism)   . Schizophrenia [facility](HCC)     Patient Active Problem List   Diagnosis Date Noted  . Alcohol abuse   . Auditory hallucination   . 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 Adjustment disorder with depressed mood 09/07/2016  . Homelessness 09/03/2016  . Person feigning illness 09/03/2016  . 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 Cannabis use disorder, mild, abuse 08/29/2016  . Paranoid schizophrenia (HCC) 08/26/2016  . Pulmonary emboli ([facility]CC) 07/13/2016  . 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 Acute left flank pain 01/02/2014  . Pulmonary embolism, bilateral (HCC) 01/02/2014    History reviewed. No pertinent surgical history.     Home Medications    Prior to Admission medications   Medication Sig Start Date End Date Taking? Authorizing Provider  acetaminophen (TYLENOL) 500 MG tablet Take 1 tablet  (500 mg total) by mouth every 6 (six) hours as needed. Patient taking differently: Take 500 mg by mouth every 6 (six) hours as needed for mild pain, moderate pain or headache.  11/03/16   Antony MaduraKelly Humes, PA-C  benztropine (COGENTIN) 0.5 MG tablet Take 1 tablet (0.5 mg total) by mouth 2 (two) times daily. 11/23/16   Tiffany Neva SeatGreene, PA-C  benztropine (COGENTIN) 1 MG tablet Take 0.5 tablets (0.5 mg total) by mouth 2 (two) times daily. 11/20/16   Mercedes Camprubi-Soms, PA-C  Diclofenac 35 MG CAPS Take 35 mg by mouth 3 (three) times daily. 11/21/16   Tiffany Neva SeatGreene, PA-C  pravastatin (PRAVACHOL) 20 MG tablet Take 1 tablet (20 mg total) by mouth daily. 09/25/16   Tiffany Neva SeatGreene, PA-C  risperiDONE (RISPERDAL) 1 MG tablet Take 1 tablet (1 mg total) by mouth every morning. 11/16/16   Dione Boozeavid Glick, MD  risperiDONE (RISPERDAL) 1 MG tablet Take 1 tablet (1mg ) PO every morning and 3 tablets (3mg ) PO QHS 11/20/16   Mercedes Camprubi-Soms, PA-C  risperiDONE (RISPERDAL) 3 MG tablet Take 1 tablet (3 mg total) by mouth at bedtime. 11/23/16   Tiffany Neva SeatGreene, PA-C  rivaroxaban (XARELTO) 20 MG TABS tablet Take 1 tablet (20 mg total) by mouth daily with supper. 11/03/16   Antony MaduraKelly Humes, PA-C  traZODone (DESYREL) 50 MG tablet Take 1-2 tablets (50-100mg ) PO at bedtime PRN sleep 11/20/16   Mercedes Camprubi-Soms, PA-C  traZODone (DESYREL) 50 MG tablet Take 1-2 tablets (50-100 mg total) by mouth at bedtime as needed for sleep. 11/23/16  Marlon Peliffany Greene, PA-C    Family History Family History  Problem Relation Age of Onset  . Hypertension Mother     Social History Social History  Substance Use Topics  . Smoking status: Current Every Day Smoker    Packs/day: 0.25    Years: 4.00    Types: Cigarettes  . Smokeless tobacco: Never Used  . Alcohol use Yes     Allergies   Haldol [haloperidol]   Review of Systems Review of Systems  Constitutional:       Per HPI, otherwise negative  HENT:       Per HPI, otherwise  negative  Respiratory:       Per HPI, otherwise negative  Cardiovascular:       Per HPI, otherwise negative  Gastrointestinal: Negative for vomiting.  Endocrine:       Negative aside from HPI  Genitourinary:       Neg aside from HPI   Musculoskeletal:       Per HPI, otherwise negative  Skin: Negative.   Neurological: Negative for syncope.  Psychiatric/Behavioral: Positive for behavioral problems, confusion, decreased concentration, sleep disturbance and suicidal ideas. The patient is nervous/anxious.      Physical Exam Updated Vital Signs BP 121/69 (BP Location: Right Arm)   Pulse 81   Temp 98.1 F (36.7 C) (Oral)   Resp 16   Ht 5\' 10"  (1.778 m)   Wt 220 lb (99.8 kg)   SpO2 98%   BMI 31.57 kg/m   Physical Exam  Constitutional: He is oriented to person, place, and time. He appears well-developed. No distress.  HENT:  Head: Normocephalic and atraumatic.  Eyes: Conjunctivae and EOM are normal.  Cardiovascular: Normal rate and regular rhythm.   Pulmonary/Chest: Effort normal. No stridor. No respiratory distress.  Abdominal: He exhibits no distension.  Musculoskeletal: He exhibits no edema.  Neurological: He is alert and oriented to person, place, and time.  Skin: Skin is warm and dry.  Nursing note and vitals reviewed.    ED Treatments / Results  Patient has been seen here several times in the past few days, labs within the past 24, reviewed  Procedures Procedures (including critical care time)  I discussed patient's case with our behavioral health colleagues. With concern for increasing auditory hallucination, patient will be evaluated by our psychiatry team in the morning.   Initial Impression / Assessment and Plan / ED Course  I have reviewed the triage vital signs and the nursing notes.  Pertinent labs & imaging results that were available during my care of the patient were reviewed by me and considered in my medical decision making (see chart for  details).  Clinical Course    A shot with multiple medical normal psychiatric issues, no access to medicine presents with increasing auditory hallucination and homicidal ideation. Patient has no physical complaints, has recent labs, no indication for additional labs currently given his stable vital signs. Final Clinical Impressions(s) / ED Diagnoses  Auditory hallucinations Homicidal ideation    Gerhard Munchobert Kashara Blocher, MD 11/25/16 0040

## 2016-11-25 NOTE — ED Notes (Signed)
Admission Note  Pt at the time of assessment endorses command auditory hallucination; states, "the voices were telling to hurt myself and others-no one in particular; I don't want to get in trouble with the law." Pt however denies SI, HI, depression or anxiety at this time. Support, encouragement, and safe environment provided.  15-minute safety checks continue. Pt was med compliant

## 2016-11-25 NOTE — [facility] Suicide Risk Assessment (Signed)
Suicide Risk Assessment  Discharge Assessment   [facility]   Principal Problem: Adjustment disorder with depressed mood Discharge Diagnoses:  Patient Active Problem List   Diagnosis Date Noted  . 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 Adjustment disorder with depressed mood [F43.21] 09/07/2016    Priority: High  . Homelessness [Z59.0] 09/03/2016    Priority: High  . Person feigning illness [Z76.5] 09/03/2016    Priority: High  . Alcohol abuse [F10.10]   . Auditory hallucination [R44.0]   . 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 Cannabis use disorder, mild, abuse [F12.10] 08/29/2016  . Paranoid schizophrenia (HCC) [F20.0] 08/26/2016  . Pulmonary emboli (HCC) [I26.99] 07/13/2016  . Pulmonary embolism and infarction ([facility]) [I26.99] 01/02/2014  . Acute left flank pain [R10.9] 01/02/2014  . Pulmonary embolism, bilateral ([facility]CC) [I26.99] 01/02/2014    Total Time spent with patient: 45 minutes  Musculoskeletal: Strength & Muscle Tone: within normal limits Gait & Station: normal Patient leans: N/A  Psychiatric Specialty Exam: Physical Exam  Constitutional: He is oriented to person, place, and time. He appears well-developed and well-nourished.  HENT:  Head: Normocephalic.  Neck: Normal range of motion.  Respiratory: Effort normal.  Musculoskeletal: Normal range of motion.  Neurological: He is alert and oriented to person, place, and time.  Psychiatric: He has a normal mood and affect. His speech is normal and behavior is normal. Judgment and thought content normal. Cognition and memory are normal.    Review of Systems  Psychiatric/Behavioral: Positive for substance abuse.  All other systems reviewed and are negative.   Blood pressure 124/56, pulse 67, temperature 98 F (36.7 C), temperature source Oral, resp. rate 18, height 5\' 10"  (1.778 m), weight 99.8 kg (220 lb), SpO2 100 %.Body mass index is 31.57  kg/m.  General Appearance: Casual  Eye Contact:  Good  Speech:  Normal Rate  Volume:  Normal  Mood:  Euthymic  Affect:  Congruent  Thought Process:  Coherent and Descriptions of Associations: Intact  Orientation:  Full (Time, Place, and Person)  Thought Content:  WDL  Suicidal Thoughts:  No  Homicidal Thoughts:  No  Memory:  Immediate;   Good Recent;   Good Remote;   Good  Judgement:  Fair  Insight:  Fair  Psychomotor Activity:  Normal  Concentration:  Concentration: Good and Attention Span: Good  Recall:  Good  Fund of Knowledge:  Fair  Language:  Good  Akathisia:  No  Handed:  Right  AIMS (if indicated):     Assets:  Leisure Time Physical Health Resilience  ADL's:  Intact  Cognition:  WNL  Sleep:      Mental Status Per Nursing Assessment::   On Admission:   substance abuse with homicidal ideations  Demographic Factors:  Male  Loss Factors: NA  Historical Factors: NA  Risk Reduction Factors:   Sense of responsibility to family, Positive social support and Positive therapeutic relationship  Continued Clinical Symptoms:  None  Cognitive Features That Contribute To Risk:  None    Suicide Risk:  Minimal: No identifiable suicidal ideation.  Patients presenting with no risk factors but with morbid ruminations; may be classified as minimal risk based on the severity of the depressive symptoms    Plan Of Care/Follow-up recommendations:  Activity:  as tolerated Diet:  heart healthy diet  LORD, JAMISON, NP 11/25/2016, 10:09 AM

## 2016-11-25 NOTE — BH Assessment (Signed)
[facility] Assessment Progress Note  Per [name], MD, this pt does not require psychiatric hospitalization at this time.  [facility].  Discharge instructions advise pt to follow up with [facility], [facility], [facility], or [facility].  Pt's nurse, Diane, has been notified.  Cole Canninghomas Marc Sivertsen, MA Triage Specialist [phone]

## 2016-11-26 ENCOUNTER — Encounter (HOSPITAL_COMMUNITY): Payer: Self-pay | Admitting: Emergency Medicine

## 2016-11-26 ENCOUNTER — Emergency Department (HOSPITAL_COMMUNITY)
Admission: EM | Admit: 2016-11-26 | Discharge: 2016-11-26 | Disposition: A | Discharge status: Home / Self Care | Payer: Medicaid Other | Attending: Physician Assistant | Admitting: Physician Assistant

## 2016-11-26 DIAGNOSIS — M25511 Pain in right shoulder: Secondary | ICD-10-CM | POA: Diagnosis not present

## 2016-11-26 DIAGNOSIS — Y929 Unspecified place or not applicable: Secondary | ICD-10-CM | POA: Diagnosis not present

## 2016-11-26 DIAGNOSIS — W2201XA Walked into wall, initial encounter: Secondary | ICD-10-CM | POA: Insufficient documentation

## 2016-11-26 DIAGNOSIS — Y999 Unspecified external cause status: Secondary | ICD-10-CM | POA: Insufficient documentation

## 2016-11-26 DIAGNOSIS — G8929 Other chronic pain: Secondary | ICD-10-CM

## 2016-11-26 DIAGNOSIS — Z7901 Long term (current) use of anticoagulants: Secondary | ICD-10-CM | POA: Insufficient documentation

## 2016-11-26 DIAGNOSIS — Y9302 Activity, running: Secondary | ICD-10-CM | POA: Diagnosis not present

## 2016-11-26 DIAGNOSIS — F17210 Nicotine dependence, cigarettes, uncomplicated: Secondary | ICD-10-CM | POA: Insufficient documentation

## 2016-11-26 MED ORDER — OXYCODONE-ACETAMINOPHEN 5-325 MG PO TABS
1.0000 | ORAL_TABLET | Freq: Once | ORAL | Status: AC
  Administered 2016-11-26: 1 via ORAL
  Filled 2016-11-26: qty 1

## 2016-11-26 MED ORDER — PREDNISONE 10 MG PO TABS: 20.0000 mg | ORAL_TABLET | Freq: Two times a day (BID) | ORAL | 0 refills | Status: DC

## 2016-11-26 NOTE — ED Notes (Signed)
Discharge instructions given.  Bag lunch provided.

## 2016-11-26 NOTE — ED Provider Notes (Signed)
[facility]-EMERGENCY DEPT Provider Note   CSN: [id number] Arrival date & time: 11/26/16  1943  By signing my name below, I, Modena Jansky, attest that this documentation has been prepared under the direction and in the presence of non-physician practitioner, Kerrie Buffalo, NP. Electronically Signed: Modena Jansky, Scribe. 11/26/2016. 9:28 PM.  History   Chief Complaint Chief Complaint  Patient presents with  . Arm Pain   The history is provided by the patient. No language interpreter was used.  Arm Pain  This is a recurrent problem. The current episode started more than 1 week ago. The problem occurs constantly. The problem has not changed since onset.The symptoms are aggravated by exertion. Nothing relieves the symptoms. Treatments tried: Pain cream. The treatment provided no relief.   HPI Comments: Cole Barrett is a 30 y.o. male who presents to the Emergency Department complaining of constant moderate R shoulder painthat started about 3 weeks ago. He states he ran into a wall 4 months ago. He was evaluated then without X-rays. Recently his pain returned and he went to [facility] for evaluation. His pain progression was unchanged, so he came to the ED today for medication. He describes the pain as unrelieved by cream he recieved from [facility] and exacerbated by movement. He reports associated swelling to right shoulder. He admits to an active PE and he has been noncompliant with his Xarelto for the past month. He denies any other complaints.   Past Medical History:  Diagnosis Date  . Homelessness   . Hypercholesterolemia   . Mental disorder   . Paranoid behavior (HCC)   . PE (pulmonary embolism)   . Schizophrenia [facility])     Patient Active Problem List   Diagnosis Date Noted  . Alcohol abuse   . Auditory hallucination   . 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 Adjustment disorder with depressed mood 09/07/2016  . Homelessness 09/03/2016  . Person feigning  illness 09/03/2016  . 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 Cannabis use disorder, mild, abuse 08/29/2016  . Paranoid schizophrenia (HCC) 08/26/2016  . Pulmonary emboli (HCC) 07/13/2016  . 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 Acute left flank pain 01/02/2014  . Pulmonary embolism, bilateral (HCC) 01/02/2014    History reviewed. No pertinent surgical history.     Home Medications    Prior to Admission medications   Medication Sig Start Date End Date Taking? Authorizing Provider  acetaminophen (TYLENOL) 500 MG tablet Take 1 tablet (500 mg total) by mouth every 6 (six) hours as needed. Patient taking differently: Take 500 mg by mouth every 6 (six) hours as needed for mild pain, moderate pain or headache.  11/03/16   [name], PA-C  benztropine (COGENTIN) 0.5 MG tablet Take 1 tablet (0.5 mg total) by mouth 2 (two) times daily. 11/23/16   Tiffany Neva Seat, PA-C  benztropine (COGENTIN) 1 MG tablet Take 0.5 tablets (0.5 mg total) by mouth 2 (two) times daily. 11/20/16   Mercedes Camprubi-Soms, PA-C  Diclofenac 35 MG CAPS Take 35 mg by mouth 3 (three) times daily. 11/21/16   Tiffany Neva Seat, PA-C  pravastatin (PRAVACHOL) 20 MG tablet Take 1 tablet (20 mg total) by mouth daily. 09/25/16   Tiffany Neva Seat, PA-C  predniSONE (DELTASONE) 10 MG tablet Take 2 tablets (20 mg total) by mouth 2 (two) times daily with a meal. 11/26/16   Hope Orlene Och, NP  risperiDONE (RISPERDAL) 1 MG tablet Take 1 tablet (1 mg total) by mouth every morning. 11/16/16   Onalee Hua  Preston FleetingGlick, MD  risperiDONE (RISPERDAL) 1 MG tablet Take 1 tablet (1mg ) PO every morning and 3 tablets (3mg ) PO QHS 11/20/16   Mercedes Camprubi-Soms, PA-C  risperiDONE (RISPERDAL) 3 MG tablet Take 1 tablet (3 mg total) by mouth at bedtime. 11/23/16   Tiffany Neva SeatGreene, PA-C  rivaroxaban (XARELTO) 20 MG TABS tablet Take 1 tablet (20 mg total) by mouth daily with supper. 11/03/16   Antony MaduraKelly Humes, PA-C  traZODone (DESYREL) 50 MG tablet Take 1-2  tablets (50-100mg ) PO at bedtime PRN sleep 11/20/16   Mercedes Camprubi-Soms, PA-C    Family History Family History  Problem Relation Age of Onset  . Hypertension Mother     Social History Social History  Substance Use Topics  . Smoking status: Current Every Day Smoker    Packs/day: 0.25    Years: 4.00    Types: Cigarettes  . Smokeless tobacco: Never Used  . Alcohol use Yes     Allergies   Haldol [haloperidol]   Review of Systems Review of Systems  Musculoskeletal: Positive for arthralgias, joint swelling and myalgias (Right shoulder).  Skin: Negative for wound.  Hematological: Bruises/bleeds easily.     Physical Exam Updated Vital Signs BP 130/79 (BP Location: Left Arm)   Pulse 89   Temp 98.1 F (36.7 C) (Oral)   Resp 18   Ht 5\' 10"  (1.778 m)   Wt 230 lb 3 oz (104.4 kg)   SpO2 97%   BMI 33.03 kg/m   Physical Exam  Constitutional: He appears well-developed and well-nourished. No distress.  HENT:  Head: Normocephalic and atraumatic.  Eyes: Conjunctivae are normal.  Neck: Neck supple.  Cardiovascular: Normal rate.   Adequate circulation.   Pulmonary/Chest: Effort normal.  Abdominal: Soft.  Musculoskeletal:       Right shoulder: He exhibits tenderness. He exhibits normal range of motion, no crepitus, no deformity, no laceration, normal pulse and normal strength.  Pain with ROM of right shoulder. Anterior aspect of right shoulder has TTP. Radial pulses 1+, adequate circulation.   Neurological: He is alert.  Skin: Skin is warm and dry.  Nursing note and vitals reviewed.    ED Treatments / Results  DIAGNOSTIC STUDIES: Oxygen Saturation is 97% on RA, normal by my interpretation.    COORDINATION OF CARE: 9:32 PM- Pt advised of plan for treatment and pt agrees.  9:58 PM- Ordered for case manager to help him start taking his Xarelto again. Pt agrees with arrangement.   Labs (all labs ordered are listed, but only abnormal results are displayed) Labs  Reviewed - No data to display  Radiology No results found.  Procedures Procedures (including critical care time)  Medications Ordered in ED Medications  oxyCODONE-acetaminophen (PERCOCET/ROXICET) 5-325 MG per tablet 1 tablet (1 tablet Oral Given 11/26/16 2154)     Initial Impression / Assessment and Plan / ED Course  I have reviewed the triage vital signs and the nursing notes.  Clinical Course   30 y.o. male with chronic right shoulder pain stable for d/c without new injury and no focal neuro deficits. Will treat with short course of steroids and patient is to follow up with ortho. Will also have case manager to call patient to try and help with getting his Xarelto.   9:58 PM- Ordered for case manager to help him start taking his Xarelto again.   Final Clinical Impressions(s) / ED Diagnoses   Final diagnoses:  Chronic right shoulder pain    New Prescriptions Discharge Medication List as of 11/26/2016  9:58 PM    START taking these medications   Details  predniSONE (DELTASONE) 10 MG tablet Take 2 tablets (20 mg total) by mouth 2 (two) times daily with a meal., Starting Sat 11/26/2016, Print       I personally performed the services described in this documentation, which was scribed in my presence. The recorded information has been reviewed and is accurate.     [facility] Orlene OchM Neese, NP 11/27/16 0132    Abelino Derrickourteney Lyn Mackuen, MD 11/29/16 [id number]

## 2016-11-26 NOTE — ED Triage Notes (Signed)
Patient reports chronic right upper arm pain for several months , denies injury , no deformity or swelling / full ROM .

## 2016-11-29 ENCOUNTER — Emergency Department (HOSPITAL_COMMUNITY)
Admission: EM | Admit: 2016-11-29 | Discharge: 2016-11-30 | Disposition: A | Discharge status: Home / Self Care | Payer: Medicaid Other | Attending: Emergency Medicine | Admitting: Emergency Medicine

## 2016-11-29 ENCOUNTER — Emergency Department (HOSPITAL_COMMUNITY)
Admission: EM | Admit: 2016-11-29 | Discharge: 2016-11-29 | Discharge status: Against Medical Advice | Payer: Medicaid Other

## 2016-11-29 ENCOUNTER — Encounter (HOSPITAL_COMMUNITY): Payer: Self-pay | Admitting: Emergency Medicine

## 2016-11-29 DIAGNOSIS — Y9301 Activity, walking, marching and hiking: Secondary | ICD-10-CM | POA: Diagnosis not present

## 2016-11-29 DIAGNOSIS — Z79899 Other long term (current) drug therapy: Secondary | ICD-10-CM | POA: Insufficient documentation

## 2016-11-29 DIAGNOSIS — Y929 Unspecified place or not applicable: Secondary | ICD-10-CM | POA: Insufficient documentation

## 2016-11-29 DIAGNOSIS — X501XXA Overexertion from prolonged static or awkward postures, initial encounter: Secondary | ICD-10-CM | POA: Diagnosis not present

## 2016-11-29 DIAGNOSIS — F17210 Nicotine dependence, cigarettes, uncomplicated: Secondary | ICD-10-CM | POA: Insufficient documentation

## 2016-11-29 DIAGNOSIS — Y999 Unspecified external cause status: Secondary | ICD-10-CM | POA: Insufficient documentation

## 2016-11-29 DIAGNOSIS — M25571 Pain in right ankle and joints of right foot: Secondary | ICD-10-CM | POA: Insufficient documentation

## 2016-11-29 DIAGNOSIS — Z7901 Long term (current) use of anticoagulants: Secondary | ICD-10-CM | POA: Diagnosis not present

## 2016-11-29 NOTE — ED Provider Notes (Signed)
[facility]-EMERG[facility]Y DEPT Provider Note   CSN: [id number] Arrival date & time: 11/29/16  2305  By signing my name below, I, Rosario AdieWilliam Andrew Hiatt, attest that this documentation has been prepared under the direction and in the presence of Felicie Mornavid Clayburn Weekly, NP. Electronically Signed: Rosario AdieWilliam Andrew Hiatt, ED Scribe. 11/29/16. 11:50 PM.  History   Chief Complaint Chief Complaint  Patient presents with  . Ankle Pain   The history is provided by the patient. No language interpreter was used.  Ankle Pain   The incident occurred 1 to 2 hours ago. The incident occurred in the yard. Injury mechanism: eversion of the ankle. The pain is present in the right ankle. The pain is moderate. The pain has been worsening since onset. Pertinent negatives include no numbness. He reports no foreign bodies present. The symptoms are aggravated by bearing weight. The treatment provided no relief.   HPI Comments: Jacquelin HawkingChristopher Adam Massie is a 31 y.o. male BIB EMS, with a PMHx of homelessness and prior PE, who presents to the Emergency Department complaining of sudden onset, gradually worsening right ankle pain onset tonight prior to arrival. Pt reports that he was walking tonight when he everted his right ankle, sustaining his pain. No full fall events, or reports injury otherwise. His pain is exacerbated with weight bearing. Pt states that he was given "pain pills" via EMS which did not relieve his pain. He denies numbness, or any other associated symptoms.   Past Medical History:  Diagnosis Date  . Homelessness   . Hypercholesterolemia   . Mental disorder   . Paranoid behavior (HCC)   . PE (pulmonary embolism)   . Schizophrenia [facility](HCC)    Patient Active Problem List   Diagnosis Date Noted  . Alcohol abuse   . Auditory hallucination   . 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 Adjustment disorder with depressed mood 09/07/2016  . Homelessness 09/03/2016  . Person feigning illness 09/03/2016  . Brief  psychotic disorder 08/29/2016  . Cannabis use disorder, mild, abuse 08/29/2016  . Paranoid schizophrenia (HCC) 08/26/2016  . Pulmonary emboli (HCC) 07/13/2016  . 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 Acute left flank pain 01/02/2014  . Pulmonary embolism, bilateral (HCC) 01/02/2014   History reviewed. No pertinent surgical history.  Home Medications    Prior to Admission medications   Medication Sig Start Date End Date Taking? Authorizing Provider  acetaminophen (TYLENOL) 500 MG tablet Take 1 tablet (500 mg total) by mouth every 6 (six) hours as needed. Patient taking differently: Take 500 mg by mouth every 6 (six) hours as needed for mild pain, moderate pain or headache.  11/03/16   Antony MaduraKelly Humes, PA-C  benztropine (COGENTIN) 0.5 MG tablet Take 1 tablet (0.5 mg total) by mouth 2 (two) times daily. 11/23/16   Tiffany Neva SeatGreene, PA-C  benztropine (COGENTIN) 1 MG tablet Take 0.5 tablets (0.5 mg total) by mouth 2 (two) times daily. 11/20/16   Mercedes Camprubi-Soms, PA-C  Diclofenac 35 MG CAPS Take 35 mg by mouth 3 (three) times daily. 11/21/16   Tiffany Neva SeatGreene, PA-C  pravastatin (PRAVACHOL) 20 MG tablet Take 1 tablet (20 mg total) by mouth daily. 09/25/16   Tiffany Neva SeatGreene, PA-C  predniSONE (DELTASONE) 10 MG tablet Take 2 tablets (20 mg total) by mouth 2 (two) times daily with a meal. 11/26/16   Hope Orlene OchM Neese, NP  risperiDONE (RISPERDAL) 1 MG tablet Take 1 tablet (1 mg total) by mouth every morning. 11/16/16   Dione Boozeavid Glick, MD  risperiDONE (RISPERDAL) 1  MG tablet Take 1 tablet (1mg ) PO every morning and 3 tablets (3mg ) PO QHS 11/20/16   Mercedes Camprubi-Soms, PA-C  risperiDONE (RISPERDAL) 3 MG tablet Take 1 tablet (3 mg total) by mouth at bedtime. 11/23/16   Tiffany Neva Seat, PA-C  rivaroxaban (XARELTO) 20 MG TABS tablet Take 1 tablet (20 mg total) by mouth daily with supper. 11/03/16   [name], PA-C  traZODone (DESYREL) 50 MG tablet Take 1-2 tablets (50-100mg ) PO at bedtime PRN sleep  11/20/16   Mercedes Camprubi-Soms, PA-C   Family History Family History  Problem Relation Age of Onset  . Hypertension Mother    Social History Social History  Substance Use Topics  . Smoking status: Current Every Day Smoker    Packs/day: 0.25    Years: 4.00    Types: Cigarettes  . Smokeless tobacco: Never Used  . Alcohol use Yes   Allergies   Haldol [haloperidol]  Review of Systems Review of Systems  Musculoskeletal: Positive for arthralgias (right) and myalgias.  Neurological: Negative for syncope and numbness.  All other systems reviewed and are negative.  Physical Exam Updated Vital Signs BP 134/74   Pulse 92   Temp 98.9 F (37.2 C) (Oral)   Resp 18   SpO2 95%   Physical Exam  Constitutional: He appears well-developed and well-nourished. No distress.  HENT:  Head: Normocephalic and atraumatic.  Eyes: Conjunctivae are normal.  Neck: Normal range of motion.  Cardiovascular: Normal rate.   Pulmonary/Chest: Effort normal.  Abdominal: He exhibits no distension.  Musculoskeletal: Normal range of motion. He exhibits tenderness.  Medial malleolus tenderness on right.   Neurological: He is alert.  Skin: No pallor.  Psychiatric: He has a normal mood and affect. His behavior is normal.  Nursing note and vitals reviewed.  ED Treatments / Results  DIAGNOSTIC STUDIES: Oxygen Saturation is 95% on RA, adequate by my interpretation.   COORDINATION OF CARE: 11:49 PM-Discussed next steps with pt. Pt verbalized understanding and is agreeable with the plan.   Radiology Dg Ankle Complete Right  Result Date: 11/30/2016 CLINICAL DATA:  Twisted ankle walking yesterday. EXAM: RIGHT ANKLE - COMPLETE 3+ VIEW COMPARISON:  None. FINDINGS: No fracture deformity nor dislocation. The ankle mortise appears congruent and the tibiofibular syndesmosis intact. No destructive bony lesions. Lateral ankle soft tissue swelling without subcutaneous gas or radiopaque foreign bodies. IMPRESSION:  Soft tissue swelling.  No acute osseous process. Electronically Signed   By: Awilda Metro M.D.   On: 11/30/2016 00:56    Procedures Procedures   Medications Ordered in ED Medications - No data to display  Initial Impression / Assessment and Plan / ED Course  I have reviewed the triage vital signs and the nursing notes.  Pertinent imaging results that were available during my care of the patient were reviewed by me and considered in my medical decision making (see chart for details).  Clinical Course    This is a 31yoM with a h/o homelessness and prior PEs, who presents into the ED following right ankle injury. Patient XR negative for obvious fracture, dislocation, or other bony abnormalities. Pain managed in ED. Pt advised to follow up with orthopedics if symptoms persist for possibility of missed fracture diagnosis. Patient given ace wrap and crutches while in ED, conservative therapy recommended and discussed. Patient will be d/c home. Pt is comfortable with above plan and is stable for discharge at this time. All questions were answered prior to disposition. Strict return precautions for f/u into the ED were  discussed.   Final Clinical Impressions(s) / ED Diagnoses   Final diagnoses:  Acute right ankle pain   New Prescriptions New Prescriptions   No medications on file   I personally performed the services described in this documentation, which was scribed in my presence. The recorded information has been reviewed and is accurate.    Felicie Morn, NP 11/30/16 [id number]    Shon Baton, MD 11/30/16 [phone]

## 2016-11-29 NOTE — ED Triage Notes (Signed)
Pt reports he was walking and "twisted my ankle".  Pt reports he is unable to walk on his ankle.  He reports the ambulance gave him some "pain pills".

## 2016-11-29 NOTE — ED Notes (Signed)
Pts name called for a room no answer 

## 2016-11-30 ENCOUNTER — Emergency Department (HOSPITAL_COMMUNITY): Payer: Medicaid Other

## 2016-11-30 MED ORDER — ACETAMINOPHEN 325 MG PO TABS
650.0000 mg | ORAL_TABLET | Freq: Once | ORAL | Status: AC
  Administered 2016-11-30: 650 mg via ORAL
  Filled 2016-11-30: qty 2

## 2016-11-30 NOTE — ED Notes (Signed)
Patient transported to X-ray 

## 2016-12-05 ENCOUNTER — Encounter (HOSPITAL_COMMUNITY): Payer: Self-pay | Admitting: Oncology

## 2016-12-05 ENCOUNTER — Other Ambulatory Visit: Payer: Self-pay

## 2016-12-05 ENCOUNTER — Emergency Department (HOSPITAL_COMMUNITY)
Admission: EM | Admit: 2016-12-05 | Discharge: 2016-12-05 | Disposition: A | Discharge status: Home / Self Care | Payer: Medicaid Other | Attending: Emergency Medicine | Admitting: Emergency Medicine

## 2016-12-05 DIAGNOSIS — Z7901 Long term (current) use of anticoagulants: Secondary | ICD-10-CM | POA: Diagnosis not present

## 2016-12-05 DIAGNOSIS — S99911D Unspecified injury of right ankle, subsequent encounter: Secondary | ICD-10-CM | POA: Diagnosis present

## 2016-12-05 DIAGNOSIS — F17210 Nicotine dependence, cigarettes, uncomplicated: Secondary | ICD-10-CM | POA: Diagnosis not present

## 2016-12-05 DIAGNOSIS — X58XXXD Exposure to other specified factors, subsequent encounter: Secondary | ICD-10-CM | POA: Diagnosis not present

## 2016-12-05 DIAGNOSIS — S93401D Sprain of unspecified ligament of right ankle, subsequent encounter: Secondary | ICD-10-CM | POA: Diagnosis not present

## 2016-12-05 MED ORDER — IBUPROFEN 200 MG PO TABS
600.0000 mg | ORAL_TABLET | Freq: Once | ORAL | Status: AC
  Administered 2016-12-05: 600 mg via ORAL
  Filled 2016-12-05: qty 3

## 2016-12-05 NOTE — Discharge Instructions (Signed)
Wear the brace for support call Orthopedics for appointment and follow up

## 2016-12-05 NOTE — ED Provider Notes (Signed)
[facility]-EMERGENCY DEPT Provider Note   CSN: [id number] Arrival date & time: 12/05/16  0155     History   Chief Complaint Chief Complaint  Patient presents with  . Ankle Pain    HPI Cole Barrett is a 31 y.o. male.  This a 31 year old male who was seen on January 2 for a sprained ankle.  He was discharged home with an ankle support.  She has not been wearing and now states that his ankle is still hurting.  Denies any new or recent injury.      Past Medical History:  Diagnosis Date  . Homelessness   . Hypercholesterolemia   . Mental disorder   . Paranoid behavior (HCC)   . PE (pulmonary embolism)   . Schizophrenia [facility])     Patient Active Problem List   Diagnosis Date Noted  . Alcohol abuse   . Auditory hallucination   . 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 Adjustment disorder with depressed mood 09/07/2016  . Homelessness 09/03/2016  . Person feigning illness 09/03/2016  . 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 Cannabis use disorder, mild, abuse 08/29/2016  . Paranoid schizophrenia (HCC) 08/26/2016  . Pulmonary emboli (HCC) 07/13/2016  . 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 Acute left flank pain 01/02/2014  . Pulmonary embolism, bilateral (HCC) 01/02/2014    History reviewed. No pertinent surgical history.     Home Medications    Prior to Admission medications   Medication Sig Start Date End Date Taking? Authorizing Provider  acetaminophen (TYLENOL) 500 MG tablet Take 1 tablet (500 mg total) by mouth every 6 (six) hours as needed. Patient taking differently: Take 500 mg by mouth every 6 (six) hours as needed for mild pain, moderate pain or headache.  11/03/16   [name], PA-C  benztropine (COGENTIN) 0.5 MG tablet Take 1 tablet (0.5 mg total) by mouth 2 (two) times daily. 11/23/16   Tiffany Neva Seat, PA-C  benztropine (COGENTIN) 1 MG tablet Take 0.5 tablets (0.5 mg total) by mouth 2 (two) times daily. 11/20/16    Mercedes Camprubi-Soms, PA-C  Diclofenac 35 MG CAPS Take 35 mg by mouth 3 (three) times daily. 11/21/16   Tiffany Neva Seat, PA-C  pravastatin (PRAVACHOL) 20 MG tablet Take 1 tablet (20 mg total) by mouth daily. 09/25/16   Tiffany Neva Seat, PA-C  predniSONE (DELTASONE) 10 MG tablet Take 2 tablets (20 mg total) by mouth 2 (two) times daily with a meal. 11/26/16   Hope Orlene Och, NP  risperiDONE (RISPERDAL) 1 MG tablet Take 1 tablet (1 mg total) by mouth every morning. 11/16/16   [name], MD  risperiDONE (RISPERDAL) 1 MG tablet Take 1 tablet (1mg ) PO every morning and 3 tablets (3mg ) PO QHS 11/20/16   Mercedes Camprubi-Soms, PA-C  risperiDONE (RISPERDAL) 3 MG tablet Take 1 tablet (3 mg total) by mouth at bedtime. 11/23/16   Tiffany Neva Seat, PA-C  rivaroxaban (XARELTO) 20 MG TABS tablet Take 1 tablet (20 mg total) by mouth daily with supper. 11/03/16   [name], PA-C  traZODone (DESYREL) 50 MG tablet Take 1-2 tablets (50-100mg ) PO at bedtime PRN sleep 11/20/16   Mercedes Camprubi-Soms, PA-C    Family History Family History  Problem Relation Age of Onset  . Hypertension Mother     Social History Social History  Substance Use Topics  . Smoking status: Current Every Day Smoker    Packs/day: 0.25    Years: 4.00    Types: Cigarettes  . Smokeless tobacco: Never Used  .  Alcohol use Yes     Allergies   Haldol [haloperidol]   Review of Systems Review of Systems  Constitutional: Negative for chills and fever.  Musculoskeletal: Positive for arthralgias. Negative for joint swelling.  Neurological: Negative for weakness.  All other systems reviewed and are negative.    Physical Exam Updated Vital Signs BP 116/80 (BP Location: Left Arm)   Pulse 92   Temp 97.6 F (36.4 C) (Oral)   Resp 16   SpO2 97%   Physical Exam  Constitutional: He is oriented to person, place, and time.  Eyes: Pupils are equal, round, and reactive to light.  Neck: Normal range of motion.  Cardiovascular: Normal  rate.   Pulmonary/Chest: Effort normal.  Musculoskeletal: Normal range of motion. He exhibits tenderness. He exhibits no edema.       Right ankle: He exhibits ecchymosis. He exhibits normal range of motion, no swelling, no deformity, no laceration and normal pulse.       Feet:  Neurological: He is alert and oriented to person, place, and time.  Skin: Skin is warm and dry.  Psychiatric: He has a normal mood and affect.  Nursing note and vitals reviewed.    ED Treatments / Results  Labs (all labs ordered are listed, but only abnormal results are displayed) Labs Reviewed - No data to display  EKG  EKG Interpretation None       Radiology No results found.  Procedures Procedures (including critical care time)  Medications Ordered in ED Medications  ibuprofen (ADVIL,MOTRIN) tablet 600 mg (not administered)     Initial Impression / Assessment and Plan / ED Course  I have reviewed the triage vital signs and the nursing notes.  Pertinent labs & imaging results that were available during my care of the patient were reviewed by me and considered in my medical decision making (see chart for details).  Clinical Course    Patient will be placed in an ASO given ibuprofen in the emergency department and discharged to again follow-up with orthopedics if needed   Final Clinical Impressions(s) / ED Diagnoses   Final diagnoses:  Sprain of right ankle, unspecified ligament, subsequent encounter    New Prescriptions New Prescriptions   No medications on file     Earley FavorGail Daana Petrasek, NP 12/05/16 0537    Earley FavorGail Tajh Livsey, NP 12/05/16 0543    Gilda Creasehristopher J Pollina, MD 12/06/16 [phone]

## 2016-12-05 NOTE — ED Triage Notes (Signed)
Pt bib [facility] d/t right ankle pain.  Pt seen here for the same dx w/ sprain and advised to follow up which pt did not do.  Per EMS pt slept during transport. Rating pain 7/10.

## 2016-12-08 ENCOUNTER — Emergency Department (HOSPITAL_COMMUNITY)
Admission: EM | Admit: 2016-12-08 | Discharge: 2016-12-08 | Disposition: A | Discharge status: Home / Self Care | Payer: Medicaid Other | Attending: Emergency Medicine | Admitting: Emergency Medicine

## 2016-12-08 ENCOUNTER — Encounter (HOSPITAL_COMMUNITY): Payer: Self-pay

## 2016-12-08 DIAGNOSIS — F600 Paranoid personality disorder: Secondary | ICD-10-CM | POA: Diagnosis not present

## 2016-12-08 DIAGNOSIS — F17210 Nicotine dependence, cigarettes, uncomplicated: Secondary | ICD-10-CM | POA: Diagnosis not present

## 2016-12-08 DIAGNOSIS — Z79899 Other long term (current) drug therapy: Secondary | ICD-10-CM | POA: Diagnosis not present

## 2016-12-08 DIAGNOSIS — R443 Hallucinations, unspecified: Secondary | ICD-10-CM | POA: Diagnosis not present

## 2016-12-08 LAB — RAPID URINE DRUG SCREEN, HOSP PERFORMED
AMPHETAMINES: NOT DETECTED
Barbiturates: NOT DETECTED
Benzodiazepines: NOT DETECTED
Cocaine: NOT DETECTED
Opiates: NOT DETECTED
Tetrahydrocannabinol: NOT DETECTED

## 2016-12-08 LAB — COMPREHENSIVE METABOLIC PANEL
ALBUMIN: 3.7 g/dL (ref 3.5–5.0)
ALK PHOS: 54 U/L (ref 38–126)
ALT: 33 U/L (ref 17–63)
ANION GAP: 8 (ref 5–15)
AST: 22 U/L (ref 15–41)
BUN: 7 mg/dL (ref 6–20)
CO2: 26 mmol/L (ref 22–32)
CREATININE: 1.05 mg/dL (ref 0.61–1.24)
Calcium: 9 mg/dL (ref 8.9–10.3)
Chloride: 104 mmol/L (ref 101–111)
GFR calc Af Amer: >60 mL/min (ref >60)
GFR calc non Af Amer: >60 mL/min (ref >60)
GLUCOSE: 120 mg/dL — AB (ref 65–99)
Potassium: 4 mmol/L (ref 3.5–5.1)
SODIUM: 138 mmol/L (ref 135–145)
Total Bilirubin: 0.6 mg/dL (ref 0.3–1.2)
Total Protein: 7 g/dL (ref 6.5–8.1)

## 2016-12-08 LAB — CBC
HCT: 38.1 % — ABNORMAL LOW (ref 39.0–52.0)
Hemoglobin: 12.4 g/dL — ABNORMAL LOW (ref 13.0–17.0)
MCH: 27 pg (ref 26.0–34.0)
MCHC: 32.5 g/dL (ref 30.0–36.0)
MCV: 82.8 fL (ref 78.0–100.0)
PLATELETS: 305 10*3/uL (ref 150–400)
RBC: 4.6 MIL/uL (ref 4.22–5.81)
RDW: 13.8 % (ref 11.5–15.5)
WBC: 4.6 10*3/uL (ref 4.0–10.5)

## 2016-12-08 LAB — ETHANOL: Alcohol, Ethyl (B): <5 mg/dL (ref <5)

## 2016-12-08 NOTE — ED Provider Notes (Signed)
[facility]-EMERGENCY DEPT Provider Note   CSN: [id number] Arrival date & time: 12/08/16  0606     History   Chief Complaint Chief Complaint  Patient presents with  . Hallucinations    HPI Cole Barrett is a 31 y.o. male.  HPI Patient reports he's been hearing voices for a year. He reports he wants to get some help. He denies he has been seen by psychiatry or behavioral health as an outpatient for treatment for this. He is requesting we can put him in the hospital so he can see a doctor to help him figure out why he is hearing these voices. Patient reports the voices are saying they're going to kill him. He denies that he has had any suicidal or homicidal thoughts. He reports he's otherwise felt well. He denies any positives on review of systems for medical illness. Past Medical History:  Diagnosis Date  . Homelessness   . Hypercholesterolemia   . Mental disorder   . Paranoid behavior (HCC)   . PE (pulmonary embolism)   . Schizophrenia [facility])     Patient Active Problem List   Diagnosis Date Noted  . Alcohol abuse   . Auditory hallucination   . 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 Adjustment disorder with depressed mood 09/07/2016  . Homelessness 09/03/2016  . Person feigning illness 09/03/2016  . 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 Cannabis use disorder, mild, abuse 08/29/2016  . Paranoid schizophrenia (HCC) 08/26/2016  . Pulmonary emboli (HCC) 07/13/2016  . 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 Acute left flank pain 01/02/2014  . Pulmonary embolism, bilateral (HCC) 01/02/2014    History reviewed. No pertinent surgical history.     Home Medications    Prior to Admission medications   Medication Sig Start Date End Date Taking? Authorizing Provider  acetaminophen (TYLENOL) 500 MG tablet Take 1 tablet (500 mg total) by mouth every 6 (six) hours as needed. Patient taking differently: Take 500 mg by mouth every 6 (six) hours as  needed for mild pain, moderate pain or headache.  11/03/16   [name], PA-C  benztropine (COGENTIN) 0.5 MG tablet Take 1 tablet (0.5 mg total) by mouth 2 (two) times daily. 11/23/16   Tiffany Neva Seat, PA-C  benztropine (COGENTIN) 1 MG tablet Take 0.5 tablets (0.5 mg total) by mouth 2 (two) times daily. 11/20/16   Mercedes Strupp Street, PA-C  Diclofenac 35 MG CAPS Take 35 mg by mouth 3 (three) times daily. 11/21/16   Tiffany Neva Seat, PA-C  pravastatin (PRAVACHOL) 20 MG tablet Take 1 tablet (20 mg total) by mouth daily. 09/25/16   Tiffany Neva Seat, PA-C  predniSONE (DELTASONE) 10 MG tablet Take 2 tablets (20 mg total) by mouth 2 (two) times daily with a meal. 11/26/16   Hope Orlene Och, NP  risperiDONE (RISPERDAL) 1 MG tablet Take 1 tablet (1 mg total) by mouth every morning. 11/16/16   [name], MD  risperiDONE (RISPERDAL) 1 MG tablet Take 1 tablet (1mg ) PO every morning and 3 tablets (3mg ) PO QHS 11/20/16   Mercedes Strupp Street, PA-C  risperiDONE (RISPERDAL) 3 MG tablet Take 1 tablet (3 mg total) by mouth at bedtime. 11/23/16   Tiffany Neva Seat, PA-C  rivaroxaban (XARELTO) 20 MG TABS tablet Take 1 tablet (20 mg total) by mouth daily with supper. 11/03/16   [name], PA-C  traZODone (DESYREL) 50 MG tablet Take 1-2 tablets (50-100mg ) PO at bedtime PRN sleep 11/20/16   Donnita Falls Street, PA-C    Family History  Family History  Problem Relation Age of Onset  . Hypertension Mother     Social History Social History  Substance Use Topics  . Smoking status: Current Every Day Smoker    Packs/day: 0.25    Years: 4.00    Types: Cigarettes  . Smokeless tobacco: Never Used  . Alcohol use Yes     Allergies   Haldol [haloperidol]   Review of Systems Review of Systems 10 Systems reviewed and are negative for acute change except as noted in the HPI.   Physical Exam Updated Vital Signs BP 140/78   Pulse 78   Temp 98 F (36.7 C) (Oral)   Resp 18   Ht 5\' 10"  (1.778 m)   Wt 220 lb  (99.8 kg)   SpO2 99%   BMI 31.57 kg/m   Physical Exam  Constitutional: He appears well-developed and well-nourished.  Patient is alert and showing no signs of distress. Communication is clear.  HENT:  Head: Normocephalic and atraumatic.  Eyes: Conjunctivae are normal.  Neck: Neck supple.  Cardiovascular: Normal rate and regular rhythm.   No murmur heard. Pulmonary/Chest: Effort normal and breath sounds normal. No respiratory distress.  Abdominal: Soft. There is no tenderness.  Musculoskeletal: He exhibits no edema.  Neurological: He is alert.  Skin: Skin is warm and dry.  Psychiatric: He has a normal mood and affect.  Patient is alert and situationally oriented. He shows no signs of responding to external stimuli. His speech is goal oriented and no signs of tangential thought.  Nursing note and vitals reviewed.    ED Treatments / Results  Labs (all labs ordered are listed, but only abnormal results are displayed) Labs Reviewed  COMPREHENSIVE METABOLIC PANEL - Abnormal; Notable for the following:       Result Value   Glucose, Bld 120 (*)    All other components within normal limits  CBC - Abnormal; Notable for the following:    Hemoglobin 12.4 (*)    HCT 38.1 (*)    All other components within normal limits  ETHANOL  RAPID URINE DRUG SCREEN, HOSP PERFORMED    EKG  EKG Interpretation None       Radiology No results found.  Procedures Procedures (including critical care time)  Medications Ordered in ED Medications - No data to display   Initial Impression / Assessment and Plan / ED Course  I have reviewed the triage vital signs and the nursing notes.  Pertinent labs & imaging results that were available during my care of the patient were reviewed by me and considered in my medical decision making (see chart for details).  Clinical Course     Final Clinical Impressions(s) / ED Diagnoses   Final diagnoses:  Hallucinations   At this time, patient does  not show signs of having a acute psychotic break. He is alert and well in appearance. Outpatient resources are provided. New Prescriptions New Prescriptions   No medications on file     Arby BarretteMarcy Evona Westra, MD 12/08/16 0800

## 2016-12-08 NOTE — ED Triage Notes (Signed)
Pt has been sleeping in lobby waiting on friends, pt states that he has been hearing voices for a long time telling him to kill himself. Pt denies SI/HI. Denies visual hallucinations.

## 2016-12-08 NOTE — Discharge Instructions (Signed)
Use the resource guide in your discharge instructions to find treatment for hallucinations. Call today.

## 2016-12-08 NOTE — ED Notes (Signed)
Sandwich and drink provided

## 2016-12-09 ENCOUNTER — Encounter (HOSPITAL_COMMUNITY): Payer: Self-pay | Admitting: Family Medicine

## 2016-12-09 ENCOUNTER — Emergency Department (HOSPITAL_COMMUNITY)
Admission: EM | Admit: 2016-12-09 | Discharge: 2016-12-10 | Disposition: A | Discharge status: Home / Self Care | Payer: Medicaid Other | Attending: Emergency Medicine | Admitting: Emergency Medicine

## 2016-12-09 ENCOUNTER — Emergency Department (HOSPITAL_COMMUNITY)
Admission: EM | Admit: 2016-12-09 | Discharge: 2016-12-09 | Disposition: A | Discharge status: Home / Self Care | Payer: Medicaid Other | Source: Home / Self Care | Attending: Emergency Medicine | Admitting: Emergency Medicine

## 2016-12-09 ENCOUNTER — Encounter (HOSPITAL_COMMUNITY): Payer: Self-pay

## 2016-12-09 DIAGNOSIS — F17210 Nicotine dependence, cigarettes, uncomplicated: Secondary | ICD-10-CM | POA: Insufficient documentation

## 2016-12-09 DIAGNOSIS — R443 Hallucinations, unspecified: Secondary | ICD-10-CM | POA: Insufficient documentation

## 2016-12-09 DIAGNOSIS — Z79899 Other long term (current) drug therapy: Secondary | ICD-10-CM | POA: Insufficient documentation

## 2016-12-09 DIAGNOSIS — R440 Auditory hallucinations: Secondary | ICD-10-CM | POA: Insufficient documentation

## 2016-12-09 DIAGNOSIS — F1014 Alcohol abuse with alcohol-induced mood disorder: Secondary | ICD-10-CM | POA: Insufficient documentation

## 2016-12-09 DIAGNOSIS — F4321 Adjustment disorder with depressed mood: Secondary | ICD-10-CM | POA: Diagnosis not present

## 2016-12-09 DIAGNOSIS — Z7901 Long term (current) use of anticoagulants: Secondary | ICD-10-CM | POA: Insufficient documentation

## 2016-12-09 DIAGNOSIS — Z8249 Family history of ischemic heart disease and other diseases of the circulatory system: Secondary | ICD-10-CM | POA: Diagnosis not present

## 2016-12-09 LAB — RAPID URINE DRUG SCREEN, HOSP PERFORMED
AMPHETAMINES: NOT DETECTED
Barbiturates: NOT DETECTED
Benzodiazepines: NOT DETECTED
Cocaine: NOT DETECTED
OPIATES: NOT DETECTED
TETRAHYDROCANNABINOL: NOT DETECTED

## 2016-12-09 LAB — CBC
HCT: 41.2 % (ref 39.0–52.0)
HCT: 37.4 % — ABNORMAL LOW (ref 39.0–52.0)
HEMOGLOBIN: 12.2 g/dL — AB (ref 13.0–17.0)
Hemoglobin: 13.8 g/dL (ref 13.0–17.0)
MCH: 27.4 pg (ref 26.0–34.0)
MCH: 26.5 pg (ref 26.0–34.0)
MCHC: 33.5 g/dL (ref 30.0–36.0)
MCHC: 32.6 g/dL (ref 30.0–36.0)
MCV: 81.7 fL (ref 78.0–100.0)
MCV: 81.1 fL (ref 78.0–100.0)
PLATELETS: 303 10*3/uL (ref 150–400)
Platelets: 319 10*3/uL (ref 150–400)
RBC: 5.04 MIL/uL (ref 4.22–5.81)
RBC: 4.61 MIL/uL (ref 4.22–5.81)
RDW: 13.7 % (ref 11.5–15.5)
RDW: 13.8 % (ref 11.5–15.5)
WBC: 5.4 10*3/uL (ref 4.0–10.5)
WBC: 4.6 10*3/uL (ref 4.0–10.5)

## 2016-12-09 LAB — COMPREHENSIVE METABOLIC PANEL
ALK PHOS: 55 U/L (ref 38–126)
ALT: 37 U/L (ref 17–63)
ALT: 38 U/L (ref 17–63)
ANION GAP: 12 (ref 5–15)
AST: 28 U/L (ref 15–41)
AST: 29 U/L (ref 15–41)
Albumin: 4 g/dL (ref 3.5–5.0)
Albumin: 4.1 g/dL (ref 3.5–5.0)
Alkaline Phosphatase: 56 U/L (ref 38–126)
Anion gap: 8 (ref 5–15)
BILIRUBIN TOTAL: 0.7 mg/dL (ref 0.3–1.2)
BUN: 5 mg/dL — ABNORMAL LOW (ref 6–20)
BUN: 12 mg/dL (ref 6–20)
CALCIUM: 9.1 mg/dL (ref 8.9–10.3)
CHLORIDE: 104 mmol/L (ref 101–111)
CO2: 23 mmol/L (ref 22–32)
CO2: 26 mmol/L (ref 22–32)
CREATININE: 1.07 mg/dL (ref 0.61–1.24)
Calcium: 8.8 mg/dL — ABNORMAL LOW (ref 8.9–10.3)
Chloride: 104 mmol/L (ref 101–111)
Creatinine, Ser: 0.99 mg/dL (ref 0.61–1.24)
GFR calc Af Amer: >60 mL/min (ref >60)
GFR calc non Af Amer: >60 mL/min (ref >60)
GFR calc non Af Amer: >60 mL/min (ref >60)
GLUCOSE: 87 mg/dL (ref 65–99)
Glucose, Bld: 110 mg/dL — ABNORMAL HIGH (ref 65–99)
Potassium: 4 mmol/L (ref 3.5–5.1)
Potassium: 3.8 mmol/L (ref 3.5–5.1)
SODIUM: 139 mmol/L (ref 135–145)
SODIUM: 138 mmol/L (ref 135–145)
TOTAL PROTEIN: 7.8 g/dL (ref 6.5–8.1)
Total Bilirubin: 0.4 mg/dL (ref 0.3–1.2)
Total Protein: 7.6 g/dL (ref 6.5–8.1)

## 2016-12-09 LAB — ETHANOL
Alcohol, Ethyl (B): 159 mg/dL — ABNORMAL HIGH (ref <5)
Alcohol, Ethyl (B): <5 mg/dL (ref <5)

## 2016-12-09 LAB — SALICYLATE LEVEL

## 2016-12-09 LAB — ACETAMINOPHEN LEVEL: Acetaminophen (Tylenol), Serum: <10 ug/mL — ABNORMAL LOW (ref 10–30)

## 2016-12-09 NOTE — ED Notes (Signed)
Requested patient to urinate but he states he was unable to urinate at this time.

## 2016-12-09 NOTE — ED Notes (Signed)
Patient has a flat affect on approach. Reports that [facility] and they told him to come over here. Reports that he has been hearing voices and they are telling to him to hurt other people. Denies current SI. Expresses an interest in going to behavioral health. No overt psychosis noted. Cooperative at this time.

## 2016-12-09 NOTE — ED Notes (Signed)
Pt given something to eat and drink. 

## 2016-12-09 NOTE — ED Triage Notes (Signed)
Pt complaining of hearing voices. Pt states voices are "coming to get me." Pt denies any SI or HI. Pt denies any ETOH or drug use today. Pt hypocapnic at triage. O2 sats 99% on RA. Pt yawning multiple times during conversations. Pt refusing to pick head up and look at RN, pt hunched over with eyes closed. VSS.

## 2016-12-09 NOTE — ED Notes (Signed)
Pt comfortable with discharge and follow up instructions. Pt declines wheelchair, escorted to waiting area by this RN. Rx x0 

## 2016-12-09 NOTE — ED Triage Notes (Signed)
Patient was picked up from the [facility] by [place] [employer] Officer and brought to [facility] because patient is hearing voices. Officer reports that the voices he hears are telling patient to do stuff. Pt has been calm, cooperative with police.

## 2016-12-09 NOTE — ED Provider Notes (Signed)
[facility]PT Provider Note   CSN: [id number] Arrival date & time: 12/09/16  0246     History   Chief Complaint Chief Complaint  Patient presents with  . Hallucinations    HPI Cole Barrett is a 31 y.o. male.  HPI  31 year old male presents for evaluation of hallucinations. Says he sees corpses and has voices in his head. They tell him they're going to kill him. Has been a problem for about 1 year. Not particularly worse. Not currently on meds because of no insurance and can't afford. Is requesting to be sent to behavioral health as he felt better one time when he was sent there. Denies feeling ill such as having fevers, vomiting, pain.  Past Medical History:  Diagnosis Date  . Homelessness   . Hypercholesterolemia   . Mental disorder   . Paranoid behavior (HCC)   . PE (pulmonary embolism)   . Schizophrenia [facility](HCC)     Patient Active Problem List   Diagnosis Date Noted  . Alcohol abuse   . Auditory hallucination   . 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 Adjustment disorder with depressed mood 09/07/2016  . Homelessness 09/03/2016  . Person feigning illness 09/03/2016  . 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 Cannabis use disorder, mild, abuse 08/29/2016  . Paranoid schizophrenia (HCC) 08/26/2016  . Pulmonary emboli (HCC) 07/13/2016  . 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 Acute left flank pain 01/02/2014  . Pulmonary embolism, bilateral (HCC) 01/02/2014    History reviewed. No pertinent surgical history.     Home Medications    Prior to Admission medications   Medication Sig Start Date End Date Taking? Authorizing Provider  acetaminophen (TYLENOL) 500 MG tablet Take 1 tablet (500 mg total) by mouth every 6 (six) hours as needed. Patient taking differently: Take 500 mg by mouth every 6 (six) hours as needed for mild pain, moderate pain or headache.  11/03/16   Antony MaduraKelly Humes, PA-C  benztropine (COGENTIN) 0.5 MG tablet  Take 1 tablet (0.5 mg total) by mouth 2 (two) times daily. 11/23/16   Tiffany Neva SeatGreene, PA-C  benztropine (COGENTIN) 1 MG tablet Take 0.5 tablets (0.5 mg total) by mouth 2 (two) times daily. 11/20/16   Mercedes Strupp Street, PA-C  Diclofenac 35 MG CAPS Take 35 mg by mouth 3 (three) times daily. 11/21/16   Tiffany Neva SeatGreene, PA-C  pravastatin (PRAVACHOL) 20 MG tablet Take 1 tablet (20 mg total) by mouth daily. 09/25/16   Tiffany Neva SeatGreene, PA-C  predniSONE (DELTASONE) 10 MG tablet Take 2 tablets (20 mg total) by mouth 2 (two) times daily with a meal. 11/26/16   Hope Orlene OchM Neese, NP  risperiDONE (RISPERDAL) 1 MG tablet Take 1 tablet (1 mg total) by mouth every morning. 11/16/16   Dione Boozeavid Glick, MD  risperiDONE (RISPERDAL) 1 MG tablet Take 1 tablet (1mg ) PO every morning and 3 tablets (3mg ) PO QHS 11/20/16   Mercedes Strupp Street, PA-C  risperiDONE (RISPERDAL) 3 MG tablet Take 1 tablet (3 mg total) by mouth at bedtime. 11/23/16   Tiffany Neva SeatGreene, PA-C  rivaroxaban (XARELTO) 20 MG TABS tablet Take 1 tablet (20 mg total) by mouth daily with supper. 11/03/16   Antony MaduraKelly Humes, PA-C  traZODone (DESYREL) 50 MG tablet Take 1-2 tablets (50-100mg ) PO at bedtime PRN sleep 11/20/16   Donnita FallsMercedes Strupp Street, PA-C    Family History Family History  Problem Relation Age of Onset  . Hypertension Mother     Social History Social History  Substance  Use Topics  . Smoking status: Current Every Day Smoker    Packs/day: 0.25    Years: 4.00    Types: Cigarettes  . Smokeless tobacco: Never Used  . Alcohol use Yes     Allergies   Haldol [haloperidol]   Review of Systems Review of Systems  Constitutional: Negative for fever.  Cardiovascular: Negative for chest pain.  Neurological: Negative for headaches.  Psychiatric/Behavioral: Positive for hallucinations. Negative for self-injury and suicidal ideas.  All other systems reviewed and are negative.    Physical Exam Updated Vital Signs BP 113/70   Pulse 61   Temp  97.5 F (36.4 C) (Oral)   Resp 16   SpO2 100%   Physical Exam  Constitutional: He is oriented to person, place, and time. He appears well-developed and well-nourished.  Laying in Doctor, general practice, resting comfortably  HENT:  Head: Normocephalic and atraumatic.  Right Ear: External ear normal.  Left Ear: External ear normal.  Nose: Nose normal.  Eyes: Right eye exhibits no discharge. Left eye exhibits no discharge.  Neck: Neck supple.  Cardiovascular: Normal rate, regular rhythm and normal heart sounds.   Pulmonary/Chest: Effort normal and breath sounds normal.  Abdominal: Soft. There is no tenderness.  Musculoskeletal: He exhibits no edema.  Neurological: He is alert and oriented to person, place, and time.  Skin: Skin is warm and dry.  Psychiatric: He is not actively hallucinating (does not appear to be actively hallucinating). He expresses no homicidal and no suicidal ideation.  Nursing note and vitals reviewed.    ED Treatments / Results  Labs (all labs ordered are listed, but only abnormal results are displayed) Labs Reviewed  COMPREHENSIVE METABOLIC PANEL - Abnormal; Notable for the following:       Result Value   Glucose, Bld 110 (*)    BUN 5 (*)    All other components within normal limits  ETHANOL - Abnormal; Notable for the following:    Alcohol, Ethyl (B) 159 (*)    All other components within normal limits  CBC  RAPID URINE DRUG SCREEN, HOSP PERFORMED    EKG  EKG Interpretation None       Radiology No results found.  Procedures Procedures (including critical care time)  Medications Ordered in ED Medications - No data to display   Initial Impression / Assessment and Plan / ED Course  I have reviewed the triage vital signs and the nursing notes.  Pertinent labs & imaging results that were available during my care of the patient were reviewed by me and considered in my medical decision making (see chart for details).  Clinical Course     This  appears to be a chronic issue and he does not have any evidence of acute psychosis, SI, or HI. No indication for emergent psychiatric consult. Discussed outpatient options, such as [facility], as well as he was given outpatient resources. He declines Rx for his medicines that he's not taking anymore.  Given return precautions.  Final Clinical Impressions(s) / ED Diagnoses   Final diagnoses:  Hallucinations    New Prescriptions New Prescriptions   No medications on file     Pricilla Loveless, MD 12/09/16 1951

## 2016-12-09 NOTE — ED Provider Notes (Signed)
[facility]-EMERGENCY DEPT Provider Note   CSN: [id number] Arrival date & time: 12/09/16  2022 By signing my name below, I, Levon Hedger, attest that this documentation has been prepared under the direction and in the presence of non-physician practitioner, Shanna Cisco, PA-C. Electronically Signed: Levon Hedger, Scribe. 12/09/2016. 10:58 PM.   History   Chief Complaint Chief Complaint  Patient presents with  . Psychiatric Evaluation    HPI Cole Barrett is a 31 y.o. male with a history of paranoid behavior, PE, and schizophrenia, brought in by [facility], who presents to the Emergency Department complaining of auditory hallucinations today. Per pt, he has had intermittent hallucinations x 1 year. He reports associated homicidal ideation. He states the voices have told him that they will kill him, which makes him "want to kill somebody". No alleviating or modifying factors noted.  No treatments tried PTA. Pt is requesting inpatient treatment and states he was seen at behavioral health in the past which was effective. Pt endorses occasional marijuana use, but denies any substance use.   The history is provided by the patient. No language interpreter was used.   Past Medical History:  Diagnosis Date  . Homelessness   . Hypercholesterolemia   . Mental disorder   . Paranoid behavior (HCC)   . PE (pulmonary embolism)   . Schizophrenia [facility])     Patient Active Problem List   Diagnosis Date Noted  . Alcohol abuse   . Auditory hallucination   . 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 Adjustment disorder with depressed mood 09/07/2016  . Homelessness 09/03/2016  . Person feigning illness 09/03/2016  . 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 Cannabis use disorder, mild, abuse 08/29/2016  . Paranoid schizophrenia (HCC) 08/26/2016  . Pulmonary emboli (HCC) 07/13/2016  . 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 Acute left flank pain 01/02/2014  . Pulmonary embolism,  bilateral (HCC) 01/02/2014    History reviewed. No pertinent surgical history.     Home Medications    Prior to Admission medications   Medication Sig Start Date End Date Taking? Authorizing Provider  acetaminophen (TYLENOL) 500 MG tablet Take 1 tablet (500 mg total) by mouth every 6 (six) hours as needed. Patient taking differently: Take 500 mg by mouth every 6 (six) hours as needed for mild pain, moderate pain or headache.  11/03/16   [name], PA-C  benztropine (COGENTIN) 0.5 MG tablet Take 1 tablet (0.5 mg total) by mouth 2 (two) times daily. 11/23/16   Tiffany Neva Seat, PA-C  benztropine (COGENTIN) 1 MG tablet Take 0.5 tablets (0.5 mg total) by mouth 2 (two) times daily. 11/20/16   Mercedes Strupp Street, PA-C  Diclofenac 35 MG CAPS Take 35 mg by mouth 3 (three) times daily. 11/21/16   Tiffany Neva Seat, PA-C  pravastatin (PRAVACHOL) 20 MG tablet Take 1 tablet (20 mg total) by mouth daily. 09/25/16   Tiffany Neva Seat, PA-C  predniSONE (DELTASONE) 10 MG tablet Take 2 tablets (20 mg total) by mouth 2 (two) times daily with a meal. 11/26/16   Hope Orlene Och, NP  risperiDONE (RISPERDAL) 1 MG tablet Take 1 tablet (1 mg total) by mouth every morning. 11/16/16   [name], MD  risperiDONE (RISPERDAL) 1 MG tablet Take 1 tablet (1mg ) PO every morning and 3 tablets (3mg ) PO QHS 11/20/16   Mercedes Strupp Street, PA-C  risperiDONE (RISPERDAL) 3 MG tablet Take 1 tablet (3 mg total) by mouth at bedtime. 11/23/16   Marlon Pel, PA-C  rivaroxaban (XARELTO) 20 MG  TABS tablet Take 1 tablet (20 mg total) by mouth daily with supper. 11/03/16   Antony MaduraKelly Humes, PA-C  traZODone (DESYREL) 50 MG tablet Take 1-2 tablets (50-100mg ) PO at bedtime PRN sleep 11/20/16   Donnita FallsMercedes Strupp Street, PA-C    Family History Family History  Problem Relation Age of Onset  . Hypertension Mother     Social History Social History  Substance Use Topics  . Smoking status: Current Every Day Smoker    Packs/day: 0.50    Years:  4.00    Types: Cigarettes  . Smokeless tobacco: Never Used  . Alcohol use Yes     Comment: 3-4 times a  week, last drink: last night    Allergies   Haldol [haloperidol]  Review of Systems Review of Systems  Psychiatric/Behavioral: Positive for hallucinations.   10 systems reviewed and all are negative for acute change except as noted in the HPI.   Physical Exam Updated Vital Signs BP 133/82 (BP Location: Left Arm)   Pulse 73   Temp 97.8 F (36.6 C) (Oral)   Resp 20   Ht 5\' 10"  (1.778 m)   Wt 220 lb (99.8 kg)   SpO2 99%   BMI 31.57 kg/m   Physical Exam  Constitutional: He is oriented to person, place, and time. He appears well-developed and well-nourished. No distress.  HENT:  Head: Normocephalic and atraumatic.  Cardiovascular: Normal rate, regular rhythm and normal heart sounds.   No murmur heard. Pulmonary/Chest: Effort normal and breath sounds normal. No respiratory distress.  Abdominal: Soft. He exhibits no distension. There is no tenderness.  Musculoskeletal: He exhibits no edema.  Neurological: He is alert and oriented to person, place, and time.  Skin: Skin is warm and dry.  Nursing note and vitals reviewed.   ED Treatments / Results  DIAGNOSTIC STUDIES:  Oxygen Saturation is 99% on RA, normal by my interpretation.    COORDINATION OF CARE:  10:50 PM Discussed treatment plan with pt at bedside and pt agreed to plan.  Labs (all labs ordered are listed, but only abnormal results are displayed) Labs Reviewed  COMPREHENSIVE METABOLIC PANEL - Abnormal; Notable for the following:       Result Value   Calcium 8.8 (*)    All other components within normal limits  ACETAMINOPHEN LEVEL - Abnormal; Notable for the following:    Acetaminophen (Tylenol), Serum <10 (*)    All other components within normal limits  CBC - Abnormal; Notable for the following:    Hemoglobin 12.2 (*)    HCT 37.4 (*)    All other components within normal limits  ETHANOL  SALICYLATE  LEVEL  RAPID URINE DRUG SCREEN, HOSP PERFORMED    EKG  EKG Interpretation None      Radiology No results found.  Procedures Procedures (including critical care time)  Medications Ordered in ED Medications - No data to display   Initial Impression / Assessment and Plan / ED Course  I have reviewed the triage vital signs and the nursing notes.  Pertinent labs & imaging results that were available during my care of the patient were reviewed by me and considered in my medical decision making (see chart for details).  Clinical Course    Cole Barrett is a 31 y.o. male who presents to ED for auditory hallucinations voluntarily requesting help. Labs reviewed and reassuring. Disposition per TTS.    Final Clinical Impressions(s) / ED Diagnoses   Final diagnoses:  None    New Prescriptions New  Prescriptions   No medications on file   I personally performed the services described in this documentation, which was scribed in my presence. The recorded information has been reviewed and is accurate.    [facility], PA-C 12/10/16 [id number]    Derwood Kaplan, MD 12/10/16 [phone]

## 2016-12-10 DIAGNOSIS — F17210 Nicotine dependence, cigarettes, uncomplicated: Secondary | ICD-10-CM

## 2016-12-10 DIAGNOSIS — F4321 Adjustment disorder with depressed mood: Secondary | ICD-10-CM | POA: Diagnosis not present

## 2016-12-10 DIAGNOSIS — F1014 Alcohol abuse with alcohol-induced mood disorder: Secondary | ICD-10-CM | POA: Diagnosis not present

## 2016-12-10 DIAGNOSIS — Z8249 Family history of ischemic heart disease and other diseases of the circulatory system: Secondary | ICD-10-CM | POA: Diagnosis not present

## 2016-12-10 DIAGNOSIS — Z79899 Other long term (current) drug therapy: Secondary | ICD-10-CM

## 2016-12-10 NOTE — BH Assessment (Signed)
Tele Assessment Note   Cole Barrett is an 31 y.o. male who presents to the ED voluntarily. Pt responded to the majority of the assessment with "uh-huh, uh, uh, no" answers. Pt reports he was feeling homicidal with thoughts of hurting others so he decided to call the police to be admitted. Pt has 44 ED admissions in the past 6 months due to similar reasons including hallucinations and voices telling him to hurt others.Pt denies any current plans to harm others. Pt reports the voices began about a year ago and have been getting progressively worse with command to hurt others. Pt did not make eye contact during the assessment and appeared drowsy.   Per Nira ConnJason Berry, FNP pt will need an AM psych eval. Nonah MattesJennifer H Pritchett, RN has been notified of the recommended disposition.  Diagnosis: Schizophrenia   Past Medical History:  Past Medical History:  Diagnosis Date   Homelessness    Hypercholesterolemia    Mental disorder    Paranoid behavior (HCC)    PE (pulmonary embolism)    Schizophrenia (HCC)     History reviewed. No pertinent surgical history.  Family History:  Family History  Problem Relation Age of Onset   Hypertension Mother     Social History:  reports that he has been smoking Cigarettes.  He has a 2.00 pack-year smoking history. He has never used smokeless tobacco. He reports that he drinks alcohol. He reports that he uses drugs, including Marijuana.  Additional Social History:  Alcohol / Drug Use Pain Medications: See PTA meds  Prescriptions: See PTA meds  Over the Counter: See PTA meds  History of alcohol / drug use?: Yes (per history, pt denies SA to this assessor )  CIWA: CIWA-Ar BP: 133/82 Pulse Rate: 73 COWS:    PATIENT STRENGTHS: (choose at least two) Capable of independent living General fund of knowledge  Allergies:  Allergies  Allergen Reactions   Haldol [Haloperidol] Shortness Of Breath    Home Medications:  (Not in a hospital  admission)  OB/GYN Status:  No LMP for male patient.  General Assessment Data Location of Assessment: [facility] TTS Assessment: In system Is this a Tele or Face-to-Face Assessment?: Face-to-Face Is this an Initial Assessment or a Re-assessment for this encounter?: Initial Assessment Marital status: Single Is patient pregnant?: No Pregnancy Status: No Living Arrangements: Alone Can pt return to current living arrangement?: Yes Admission Status: Voluntary Is patient capable of signing voluntary admission?: Yes Referral Source: Self/Family/Friend Insurance type: Medicaid     Crisis Care Plan Living Arrangements: Alone Name of Psychiatrist: None Name of Therapist: None  Education Status Is patient currently in school?: No Highest grade of school patient has completed: 12th grade  Risk to self with the past 6 months Suicidal Ideation: No Has patient been a risk to self within the past 6 months prior to admission? : No Suicidal Intent: No Has patient had any suicidal intent within the past 6 months prior to admission? : No Is patient at risk for suicide?: No Suicidal Plan?: No Has patient had any suicidal plan within the past 6 months prior to admission? : No Access to Means: No What has been your use of drugs/alcohol within the last 12 months?: denies, per chart pt has a SA history  Previous Attempts/Gestures: No Triggers for Past Attempts: Hallucinations Intentional Self Injurious Behavior: None Family Suicide History: No Recent stressful life event(s): Other (Comment) (pt reports hallucinations) Persecutory voices/beliefs?: No Depression: No Substance abuse history and/or treatment for  substance abuse?: Yes Suicide prevention information given to non-admitted patients: Not applicable  Risk to Others within the past 6 months Homicidal Ideation: Yes-Currently Present Does patient have any lifetime risk of violence toward others beyond the six months prior to admission? :  Yes (comment) (reports voices in his head tell him to hurt others) Thoughts of Harm to Others: Yes-Currently Present Comment - Thoughts of Harm to Others: pt reports he has thoughts of hurting others and called the police to be admitted to the hospital instead of hurting others Current Homicidal Intent: No Current Homicidal Plan: No Access to Homicidal Means: No History of harm to others?: Yes Assessment of Violence: In distant past Violent Behavior Description: pt reports he has been violent and hurt others in the past Does patient have access to weapons?: No Criminal Charges Pending?: No Does patient have a court date: No Is patient on probation?: No  Psychosis Hallucinations: Auditory, With command Delusions: None noted  Mental Status Report Appearance/Hygiene: In scrubs Eye Contact: Poor Motor Activity: Freedom of movement Speech: Logical/coherent, Soft, Slow Level of Consciousness: Drowsy Mood: Other (Comment) (UTA, pt sleepy and difficult to arouse ) Affect: Constricted Anxiety Level: None Thought Processes: Coherent, Relevant Judgement: Partial Orientation: Person, Place Obsessive Compulsive Thoughts/Behaviors: None  Cognitive Functioning Concentration: Fair Memory: Recent Intact, Remote Intact IQ: Average Insight: Fair Impulse Control: Fair Appetite: Fair Sleep: No Change Total Hours of Sleep: 8 Vegetative Symptoms: None  ADLScreening [facility] Assessment Services) Patient's cognitive ability adequate to safely complete daily activities?: Yes Patient able to express need for assistance with ADLs?: Yes Independently performs ADLs?: Yes (appropriate for developmental age)  Prior Inpatient Therapy Prior Inpatient Therapy: Yes Prior Therapy Dates: 2017 Prior Therapy Facilty/Provider(s): [facility]  Reason for Treatment: Schizophrenia   Prior Outpatient Therapy Prior Outpatient Therapy: No Does patient have an ACCT team?: No Does patient have Intensive In-House  Services?  : No Does patient have [facility] services? : No Does patient have P4CC services?: No  ADL Screening (condition at time of admission) Patient's cognitive ability adequate to safely complete daily activities?: Yes Is the patient deaf or have difficulty hearing?: No Does the patient have difficulty seeing, even when wearing glasses/contacts?: No Does the patient have difficulty concentrating, remembering, or making decisions?: No Patient able to express need for assistance with ADLs?: Yes Does the patient have difficulty dressing or bathing?: No Independently performs ADLs?: Yes (appropriate for developmental age) Does the patient have difficulty walking or climbing stairs?: No Weakness of Legs: None Weakness of Arms/Hands: None  Home Assistive Devices/Equipment Home Assistive Devices/Equipment: None    Abuse/Neglect Assessment (Assessment to be complete while patient is alone) Physical Abuse: Denies Verbal Abuse: Denies Sexual Abuse: Denies Exploitation of patient/patient's resources: Denies Self-Neglect: Denies     Merchant navy officer (For Healthcare) Does Patient Have a Medical Advance Directive?: No Would patient like information on creating a medical advance directive?: No - Patient declined    Additional Information 1:1 In Past 12 Months?: No CIRT Risk: Yes Elopement Risk: No Does patient have medical clearance?: Yes     Disposition:  Disposition Initial Assessment Completed for this Encounter: Yes Disposition of Patient: Other dispositions Other disposition(s): Other (Comment) (AM psych eval per [name], FNP )  [name] 12/10/2016 12:28 AM

## 2016-12-10 NOTE — Consult Note (Signed)
[facility] Consult   Reason for Consult:  Alcohol abuse with hallucinations Referring Physician:  EDP Patient Identification: Cole Barrett MRN:  [id number] Principal Diagnosis: Alcohol abuse with alcohol-induced mood disorder [facility]) Diagnosis:   Patient Active Problem List   Diagnosis Date Noted  . Alcohol abuse with alcohol-induced mood disorder ([place]) [F10.14] 09/08/2016    Priority: High  . Adjustment disorder with depressed mood [F43.21] 09/07/2016    Priority: High  . Homelessness [Z59.0] 09/03/2016    Priority: High  . Person feigning illness [Z76.5] 09/03/2016    Priority: High  . Alcohol abuse [F10.10]   . Auditory hallucination [R44.0]   . 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 Cannabis use disorder, mild, abuse [F12.10] 08/29/2016  . Paranoid schizophrenia ([place]) [F20.0] 08/26/2016  . Pulmonary emboli ([place]) [I26.99] 07/13/2016  . Pulmonary embolism and infarction ([place]) [I26.99] 01/02/2014  . Acute left flank pain [R10.9] 01/02/2014  . Pulmonary embolism, bilateral ([place]) [I26.99] 01/02/2014    Total Time spent with patient: 45 minutes  Subjective:   Cole Barrett is a 31 y.o. male patient is "good".  HPI:  31 yo male who presented to the ED via [facility] with alcohol intoxication and hallucinations.  He is well known to this ED and providers as he has been here 44 times in the past six months for similar issues.  Once he gets some sleep, he clears.  No hallucinations today nor suicidal/homicidal ideations or withdrawal symptoms.  Stable for discharge.  Past Psychiatric History: substance abuse  Risk to Self: None Risk to Others: None Prior Inpatient Therapy: Prior Inpatient Therapy: Yes Prior Therapy Dates: 2017 Prior Therapy Facilty/Provider(s): [facility]  Reason for Treatment: Schizophrenia  Prior Outpatient Therapy: Prior Outpatient Therapy: No Does patient have an ACCT team?: No Does patient have Intensive In-House  Services?  : No Does patient have [facility] services? : No Does patient have P4CC services?: No  Past Medical History:  Past Medical History:  Diagnosis Date  . Homelessness   . Hypercholesterolemia   . Mental disorder   . Paranoid behavior ([place])   . PE (pulmonary embolism)   . Schizophrenia ([place])    History reviewed. No pertinent surgical history. Family History:  Family History  Problem Relation Age of Onset  . Hypertension Mother    Family Psychiatric  History: none Social History:  History  Alcohol Use  . Yes    Comment: 3-4 times a  week, last drink: last night     History  Drug Use  . Types: Marijuana    Comment: Once every 2-3 months, Last used: Last month    Social History   Social History  . Marital status: Single    Spouse name: N/A  . Number of children: N/A  . Years of education: N/A   Social History Main Topics  . Smoking status: Current Every Day Smoker    Packs/day: 0.50    Years: 4.00    Types: Cigarettes  . Smokeless tobacco: Never Used  . Alcohol use Yes     Comment: 3-4 times a  week, last drink: last night  . Drug use:     Types: Marijuana     Comment: Once every 2-3 months, Last used: Last month  . Sexual activity: No   Other Topics Concern  . None   Social History Narrative  . None   Additional Social History:    Allergies:   Allergies  Allergen Reactions  . Haldol [Haloperidol] Shortness Of  Breath    Labs:  Results for orders placed or performed during the hospital encounter of 12/09/16 (from the past 48 hour(s))  Rapid urine drug screen (hospital performed)     Status: None   Collection Time: 12/09/16  8:35 PM  Result Value Ref Range   Opiates NONE DETECTED NONE DETECTED   Cocaine NONE DETECTED NONE DETECTED   Benzodiazepines NONE DETECTED NONE DETECTED   Amphetamines NONE DETECTED NONE DETECTED   Tetrahydrocannabinol NONE DETECTED NONE DETECTED   Barbiturates NONE DETECTED NONE DETECTED    Comment:        DRUG  SCREEN FOR MEDICAL PURPOSES ONLY.  IF CONFIRMATION IS NEEDED FOR ANY PURPOSE, NOTIFY LAB WITHIN 5 DAYS.        LOWEST DETECTABLE LIMITS FOR URINE DRUG SCREEN Drug Class       Cutoff (ng/mL) Amphetamine      1000 Barbiturate      200 Benzodiazepine   034 Tricyclics       742 Opiates          300 Cocaine          300 THC              50   Comprehensive metabolic panel     Status: Abnormal   Collection Time: 12/09/16  9:29 PM  Result Value Ref Range   Sodium 138 135 - 145 mmol/L   Potassium 3.8 3.5 - 5.1 mmol/L   Chloride 104 101 - 111 mmol/L   CO2 26 22 - 32 mmol/L   Glucose, Bld 87 65 - 99 mg/dL   BUN 12 6 - 20 mg/dL   Creatinine, Ser 1.07 0.61 - 1.24 mg/dL   Calcium 8.8 (L) 8.9 - 10.3 mg/dL   Total Protein 7.6 6.5 - 8.1 g/dL   Albumin 4.1 3.5 - 5.0 g/dL   AST 29 15 - 41 U/L   ALT 38 17 - 63 U/L   Alkaline Phosphatase 56 38 - 126 U/L   Total Bilirubin 0.4 0.3 - 1.2 mg/dL   GFR calc non Af Amer >60 >60 mL/min   GFR calc Af Amer >60 >60 mL/min    Comment: (NOTE) The eGFR has been calculated using the CKD EPI equation. This calculation has not been validated in all clinical situations. eGFR's persistently <60 mL/min signify possible Chronic Kidney Disease.    Anion gap 8 5 - 15  Ethanol     Status: None   Collection Time: 12/09/16  9:29 PM  Result Value Ref Range   Alcohol, Ethyl (B) <5 <5 mg/dL    Comment:        LOWEST DETECTABLE LIMIT FOR SERUM ALCOHOL IS 5 mg/dL FOR MEDICAL PURPOSES ONLY   Salicylate level     Status: None   Collection Time: 12/09/16  9:29 PM  Result Value Ref Range   Salicylate Lvl <5.9 2.8 - 30.0 mg/dL  Acetaminophen level     Status: Abnormal   Collection Time: 12/09/16  9:29 PM  Result Value Ref Range   Acetaminophen (Tylenol), Serum <10 (L) 10 - 30 ug/mL    Comment:        THERAPEUTIC CONCENTRATIONS VARY SIGNIFICANTLY. A RANGE OF 10-30 ug/mL MAY BE AN EFFECTIVE CONCENTRATION FOR MANY PATIENTS. HOWEVER, SOME ARE BEST TREATED AT  CONCENTRATIONS OUTSIDE THIS RANGE. ACETAMINOPHEN CONCENTRATIONS >150 ug/mL AT 4 HOURS AFTER INGESTION AND >50 ug/mL AT 12 HOURS AFTER INGESTION ARE OFTEN ASSOCIATED WITH TOXIC REACTIONS.   cbc  Status: Abnormal   Collection Time: 12/09/16  9:29 PM  Result Value Ref Range   WBC 4.6 4.0 - 10.5 K/uL   RBC 4.61 4.22 - 5.81 MIL/uL   Hemoglobin 12.2 (L) 13.0 - 17.0 g/dL   HCT 37.4 (L) 39.0 - 52.0 %   MCV 81.1 78.0 - 100.0 fL   MCH 26.5 26.0 - 34.0 pg   MCHC 32.6 30.0 - 36.0 g/dL   RDW 13.8 11.5 - 15.5 %   Platelets 319 150 - 400 K/uL    No current facility-administered medications for this encounter.    Current Outpatient Prescriptions  Medication Sig Dispense Refill  . acetaminophen (TYLENOL) 500 MG tablet Take 1 tablet (500 mg total) by mouth every 6 (six) hours as needed. (Patient not taking: Reported on 12/09/2016) 30 tablet 0  . benztropine (COGENTIN) 0.5 MG tablet Take 1 tablet (0.5 mg total) by mouth 2 (two) times daily. (Patient not taking: Reported on 12/09/2016) 60 tablet 0  . benztropine (COGENTIN) 1 MG tablet Take 0.5 tablets (0.5 mg total) by mouth 2 (two) times daily. (Patient not taking: Reported on 12/09/2016) 5 tablet 0  . Diclofenac 35 MG CAPS Take 35 mg by mouth 3 (three) times daily. (Patient not taking: Reported on 12/09/2016) 90 capsule 0  . pravastatin (PRAVACHOL) 20 MG tablet Take 1 tablet (20 mg total) by mouth daily. (Patient not taking: Reported on 12/09/2016) 30 tablet 0  . predniSONE (DELTASONE) 10 MG tablet Take 2 tablets (20 mg total) by mouth 2 (two) times daily with a meal. (Patient not taking: Reported on 12/09/2016) 16 tablet 0  . risperiDONE (RISPERDAL) 1 MG tablet Take 1 tablet (1 mg total) by mouth every morning. (Patient not taking: Reported on 12/09/2016) 30 tablet 0  . risperiDONE (RISPERDAL) 1 MG tablet Take 1 tablet ('1mg'$ ) PO every morning and 3 tablets ('3mg'$ ) PO QHS (Patient not taking: Reported on 12/09/2016) 40 tablet 0  . risperiDONE (RISPERDAL) 3  MG tablet Take 1 tablet (3 mg total) by mouth at bedtime. (Patient not taking: Reported on 12/09/2016) 30 tablet 0  . rivaroxaban (XARELTO) 20 MG TABS tablet Take 1 tablet (20 mg total) by mouth daily with supper. (Patient not taking: Reported on 12/09/2016) 30 tablet 1  . traZODone (DESYREL) 50 MG tablet Take 1-2 tablets (50-'100mg'$ ) PO at bedtime PRN sleep (Patient not taking: Reported on 12/09/2016) 20 tablet 0    Musculoskeletal: Strength & Muscle Tone: within normal limits Gait & Station: normal Patient leans: N/A  Psychiatric Specialty Exam: Physical Exam  Constitutional: He is oriented to person, place, and time. He appears well-developed and well-nourished.  HENT:  Head: Normocephalic.  Neck: Normal range of motion.  Respiratory: Effort normal.  Musculoskeletal: Normal range of motion.  Neurological: He is alert and oriented to person, place, and time.  Psychiatric: He has a normal mood and affect. His speech is normal and behavior is normal. Judgment and thought content normal. Cognition and memory are normal.    Review of Systems  Psychiatric/Behavioral: Positive for substance abuse.  All other systems reviewed and are negative.   Blood pressure 113/66, pulse 63, temperature 97.8 F (36.6 C), temperature source Oral, resp. rate 16, height '5\' 10"'$  (1.778 m), weight 99.8 kg (220 lb), SpO2 100 %.Body mass index is 31.57 kg/m.  General Appearance: Casual  Eye Contact:  Good  Speech:  Normal Rate  Volume:  Normal  Mood:  Euthymic  Affect:  Congruent  Thought Process:  Coherent and Descriptions of  Associations: Intact  Orientation:  Full (Time, Place, and Person)  Thought Content:  WDL  Suicidal Thoughts:  No  Homicidal Thoughts:  No  Memory:  Immediate;   Good Recent;   Good Remote;   Good  Judgement:  Fair  Insight:  Fair  Psychomotor Activity:  Normal  Concentration:  Concentration: Good and Attention Span: Good  Recall:  Good  Fund of Knowledge:  Fair  Language:   Good  Akathisia:  No  Handed:  Right  AIMS (if indicated):     Assets:  Leisure Time Physical Health Resilience  ADL's:  Intact  Cognition:  WNL  Sleep:        Treatment Plan Summary: Daily contact with patient to assess and evaluate symptoms and progress in treatment, Medication management and Plan alcohol abuse with alcohol induced mood disorder:  -Crisis stabilization -Medication management:  None started due to his intoxication -Individual and substance abuse counseling  Disposition: No evidence of imminent risk to self or others at present.    Waylan Boga, NP 12/10/2016 10:19 AM   Reviewed the information documented and agree with the treatment plan.  Romy Ipock 12/10/2016 2:30 PM

## 2016-12-10 NOTE — ED Notes (Signed)
Pt denies si/hi/avh on dc.  [facility].  Pt encouraged to seek treatment for changes or return of si/ha/avh.  Pt verbalized understanding of follow up and dc instructions.  Pt ambulatory to dc area with [facility], belongings returned after leaving the unit.

## 2016-12-10 NOTE — [facility] Suicide Risk Assessment (Signed)
Suicide Risk Assessment  Discharge Assessment   [facility]   Principal Problem: Alcohol abuse with alcohol-induced mood disorder [facility](HCC) Discharge Diagnoses:  Patient Active Problem List   Diagnosis Date Noted  . Alcohol abuse with alcohol-induced mood disorder (HCC) [F10.14] 09/08/2016    Priority: High  . Adjustment disorder with depressed mood [F43.21] 09/07/2016    Priority: High  . Homelessness [Z59.0] 09/03/2016    Priority: High  . Person feigning illness [Z76.5] 09/03/2016    Priority: High  . Alcohol abuse [F10.10]   . Auditory hallucination [R44.0]   . 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 Cannabis use disorder, mild, abuse [F12.10] 08/29/2016  . Paranoid schizophrenia (HCC) [F20.0] 08/26/2016  . Pulmonary emboli (HCC) [I26.99] 07/13/2016  . Pulmonary embolism and infarction (HCC) [I26.99] 01/02/2014  . Acute left flank pain [R10.9] 01/02/2014  . Pulmonary embolism, bilateral (HCC) [I26.99] 01/02/2014    Total Time spent with patient: 45 minutes    Musculoskeletal: Strength & Muscle Tone: within normal limits Gait & Station: normal Patient leans: N/A  Psychiatric Specialty Exam: Physical Exam  Constitutional: He is oriented to person, place, and time. He appears well-developed and well-nourished.  HENT:  Head: Normocephalic.  Neck: Normal range of motion.  Respiratory: Effort normal.  Musculoskeletal: Normal range of motion.  Neurological: He is alert and oriented to person, place, and time.  Psychiatric: He has a normal mood and affect. His speech is normal and behavior is normal. Judgment and thought content normal. Cognition and memory are normal.    Review of Systems  Psychiatric/Behavioral: Positive for substance abuse.  All other systems reviewed and are negative.   Blood pressure 113/66, pulse 63, temperature 97.8 F (36.6 C), temperature source Oral, resp. rate 16, height 5\' 10"  (1.778 m), weight 99.8 kg (220 lb),  SpO2 100 %.Body mass index is 31.57 kg/m.  General Appearance: Casual  Eye Contact:  Good  Speech:  Normal Rate  Volume:  Normal  Mood:  Euthymic  Affect:  Congruent  Thought Process:  Coherent and Descriptions of Associations: Intact  Orientation:  Full (Time, Place, and Person)  Thought Content:  WDL  Suicidal Thoughts:  No  Homicidal Thoughts:  No  Memory:  Immediate;   Good Recent;   Good Remote;   Good  Judgement:  Fair  Insight:  Fair  Psychomotor Activity:  Normal  Concentration:  Concentration: Good and Attention Span: Good  Recall:  Good  Fund of Knowledge:  Fair  Language:  Good  Akathisia:  No  Handed:  Right  AIMS (if indicated):     Assets:  Leisure Time Physical Health Resilience  ADL's:  Intact  Cognition:  WNL  Sleep:       Mental Status Per Nursing Assessment::   On Admission:   alcohol abuse with hallucinations  Demographic Factors:  Male  Loss Factors: NA  Historical Factors: NA  Risk Reduction Factors:   Sense of responsibility to family  Continued Clinical Symptoms:  None  Cognitive Features That Contribute To Risk:  None    Suicide Risk:  Minimal: No identifiable suicidal ideation.  Patients presenting with no risk factors but with morbid ruminations; may be classified as minimal risk based on the severity of the depressive symptoms    Plan Of Care/Follow-up recommendations:  Activity:  as tolerated Diet:  heart healthy diet  LORD, JAMISON, NP 12/10/2016, 10:32 AM

## 2016-12-17 ENCOUNTER — Encounter (HOSPITAL_COMMUNITY): Payer: Self-pay | Admitting: Emergency Medicine

## 2016-12-17 ENCOUNTER — Emergency Department (HOSPITAL_COMMUNITY)
Admission: EM | Admit: 2016-12-17 | Discharge: 2016-12-17 | Disposition: A | Discharge status: Home / Self Care | Payer: Medicaid Other | Attending: Emergency Medicine | Admitting: Emergency Medicine

## 2016-12-17 DIAGNOSIS — R440 Auditory hallucinations: Secondary | ICD-10-CM

## 2016-12-17 DIAGNOSIS — F17210 Nicotine dependence, cigarettes, uncomplicated: Secondary | ICD-10-CM | POA: Diagnosis not present

## 2016-12-17 DIAGNOSIS — Z79899 Other long term (current) drug therapy: Secondary | ICD-10-CM | POA: Insufficient documentation

## 2016-12-17 LAB — CBC WITH DIFFERENTIAL/PLATELET
Basophils Absolute: 0 10*3/uL (ref 0.0–0.1)
Basophils Relative: 0 %
EOS ABS: 0.2 10*3/uL (ref 0.0–0.7)
Eosinophils Relative: 3 %
HCT: 38.7 % — ABNORMAL LOW (ref 39.0–52.0)
HEMOGLOBIN: 12.6 g/dL — AB (ref 13.0–17.0)
LYMPHS ABS: 1.6 10*3/uL (ref 0.7–4.0)
Lymphocytes Relative: 31 %
MCH: 27.5 pg (ref 26.0–34.0)
MCHC: 32.6 g/dL (ref 30.0–36.0)
MCV: 84.3 fL (ref 78.0–100.0)
MONOS PCT: 8 %
Monocytes Absolute: 0.4 10*3/uL (ref 0.1–1.0)
NEUTROS PCT: 58 %
Neutro Abs: 3 10*3/uL (ref 1.7–7.7)
Platelets: 265 10*3/uL (ref 150–400)
RBC: 4.59 MIL/uL (ref 4.22–5.81)
RDW: 14.3 % (ref 11.5–15.5)
WBC: 5.3 10*3/uL (ref 4.0–10.5)

## 2016-12-17 LAB — RAPID URINE DRUG SCREEN, HOSP PERFORMED
AMPHETAMINES: NOT DETECTED
BARBITURATES: NOT DETECTED
Benzodiazepines: NOT DETECTED
Cocaine: NOT DETECTED
Opiates: NOT DETECTED
Tetrahydrocannabinol: POSITIVE — AB

## 2016-12-17 LAB — ETHANOL

## 2016-12-17 LAB — BASIC METABOLIC PANEL
Anion gap: 9 (ref 5–15)
BUN: 7 mg/dL (ref 6–20)
CHLORIDE: 105 mmol/L (ref 101–111)
CO2: 26 mmol/L (ref 22–32)
CREATININE: 1.11 mg/dL (ref 0.61–1.24)
Calcium: 9.2 mg/dL (ref 8.9–10.3)
GFR calc Af Amer: >60 mL/min (ref >60)
GFR calc non Af Amer: >60 mL/min (ref >60)
Glucose, Bld: 106 mg/dL — ABNORMAL HIGH (ref 65–99)
Potassium: 3.9 mmol/L (ref 3.5–5.1)
Sodium: 140 mmol/L (ref 135–145)

## 2016-12-17 NOTE — ED Triage Notes (Signed)
Pt. reports auditory hallucinations onset this week , denies suicidal ideation .

## 2016-12-17 NOTE — ED Provider Notes (Signed)
[facility]-EMERGENCY DEPT Provider Note   CSN: [id number] Arrival date & time: 12/17/16  0128     History   Chief Complaint Chief Complaint  Patient presents with  . Auditory Hallucinations    HPI Cole Barrett is a 31 y.o. male.  Patient is a 31 year old male with past medical history of schizophrenia. He presents for evaluation of "hearing voices". He tells me he hears voices saying that they are going to "kill me". The voices became quite loud last night and prompted him to seek medical attention. He denies to me that he is suicidal or homicidal. He does report running out of his medications and has not been on these for nearly one week.      Past Medical History:  Diagnosis Date  . Homelessness   . Hypercholesterolemia   . Mental disorder   . Paranoid behavior (HCC)   . PE (pulmonary embolism)   . Schizophrenia [facility])     Patient Active Problem List   Diagnosis Date Noted  . Alcohol abuse   . Auditory hallucination   . Alcohol abuse with alcohol-induced mood disorder (HCC) 09/08/2016  . Adjustment disorder with depressed mood 09/07/2016  . Homelessness 09/03/2016  . Person feigning illness 09/03/2016  . 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 Cannabis use disorder, mild, abuse 08/29/2016  . Paranoid schizophrenia (HCC) 08/26/2016  . Pulmonary emboli (HCC) 07/13/2016  . 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 Acute left flank pain 01/02/2014  . Pulmonary embolism, bilateral (HCC) 01/02/2014    History reviewed. No pertinent surgical history.     Home Medications    Prior to Admission medications   Medication Sig Start Date End Date Taking? Authorizing Provider  acetaminophen (TYLENOL) 500 MG tablet Take 1 tablet (500 mg total) by mouth every 6 (six) hours as needed. Patient not taking: Reported on 12/09/2016 11/03/16   [name], PA-C  benztropine (COGENTIN) 0.5 MG tablet Take 1 tablet (0.5 mg total) by mouth 2 (two) times  daily. Patient not taking: Reported on 12/09/2016 11/23/16   Marlon Pel, PA-C  benztropine (COGENTIN) 1 MG tablet Take 0.5 tablets (0.5 mg total) by mouth 2 (two) times daily. Patient not taking: Reported on 12/09/2016 11/20/16   Donnita Falls Street, PA-C  Diclofenac 35 MG CAPS Take 35 mg by mouth 3 (three) times daily. Patient not taking: Reported on 12/09/2016 11/21/16   Marlon Pel, PA-C  pravastatin (PRAVACHOL) 20 MG tablet Take 1 tablet (20 mg total) by mouth daily. Patient not taking: Reported on 12/09/2016 09/25/16   Marlon Pel, PA-C  predniSONE (DELTASONE) 10 MG tablet Take 2 tablets (20 mg total) by mouth 2 (two) times daily with a meal. Patient not taking: Reported on 12/09/2016 11/26/16   Janne Napoleon, NP  risperiDONE (RISPERDAL) 1 MG tablet Take 1 tablet (1 mg total) by mouth every morning. Patient not taking: Reported on 12/09/2016 11/16/16   [name], MD  risperiDONE (RISPERDAL) 1 MG tablet Take 1 tablet (1mg ) PO every morning and 3 tablets (3mg ) PO QHS Patient not taking: Reported on 12/09/2016 11/20/16   Donnita Falls Street, PA-C  risperiDONE (RISPERDAL) 3 MG tablet Take 1 tablet (3 mg total) by mouth at bedtime. Patient not taking: Reported on 12/09/2016 11/23/16   Marlon Pel, PA-C  rivaroxaban (XARELTO) 20 MG TABS tablet Take 1 tablet (20 mg total) by mouth daily with supper. Patient not taking: Reported on 12/09/2016 11/03/16   [name], PA-C  traZODone (DESYREL) 50 MG tablet Take 1-2  tablets (50-100mg ) PO at bedtime PRN sleep Patient not taking: Reported on 12/09/2016 11/20/16   Donnita FallsMercedes Strupp Street, PA-C    Family History Family History  Problem Relation Age of Onset  . Hypertension Mother     Social History Social History  Substance Use Topics  . Smoking status: Current Every Day Smoker    Packs/day: 0.00    Types: Cigarettes  . Smokeless tobacco: Never Used  . Alcohol use Yes     Allergies   Haldol [haloperidol]   Review of  Systems Review of Systems  All other systems reviewed and are negative.    Physical Exam Updated Vital Signs BP 121/58   Pulse 81   Temp 98 F (36.7 C) (Oral)   Resp 16   Ht 5\' 10"  (1.778 m)   Wt 223 lb (101.2 kg)   SpO2 98%   BMI 32.00 kg/m   Physical Exam  Constitutional: He is oriented to person, place, and time. He appears well-developed and well-nourished. No distress.  HENT:  Head: Normocephalic and atraumatic.  Mouth/Throat: Oropharynx is clear and moist.  Neck: Normal range of motion. Neck supple.  Cardiovascular: Normal rate and regular rhythm.  Exam reveals no friction rub.   No murmur heard. Pulmonary/Chest: Effort normal and breath sounds normal. No respiratory distress. He has no wheezes. He has no rales.  Abdominal: Soft. Bowel sounds are normal. He exhibits no distension. There is no tenderness.  Musculoskeletal: Normal range of motion. He exhibits no edema.  Neurological: He is alert and oriented to person, place, and time. Coordination normal.  Skin: Skin is warm and dry. He is not diaphoretic.  Psychiatric: His affect is blunt. His speech is rapid and/or pressured. He is withdrawn and actively hallucinating. Thought content is paranoid. Cognition and memory are normal.  Nursing note and vitals reviewed.    ED Treatments / Results  Labs (all labs ordered are listed, but only abnormal results are displayed) Labs Reviewed  BASIC METABOLIC PANEL  CBC WITH DIFFERENTIAL/PLATELET  ETHANOL  RAPID URINE DRUG SCREEN, HOSP PERFORMED    EKG  EKG Interpretation None       Radiology No results found.  Procedures Procedures (including critical care time)  Medications Ordered in ED Medications - No data to display   Initial Impression / Assessment and Plan / ED Course  I have reviewed the triage vital signs and the nursing notes.  Pertinent labs & imaging results that were available during my care of the patient were reviewed by me and considered  in my medical decision making (see chart for details).     Patient presents with auditory hallucinations. He was seen recently for the same and started on medications. He started taking these yesterday. Was seen by TTS 2 does not feel as though he meets inpatient criteria. He will be given his discharge and is to follow-up with [facility].  Final Clinical Impressions(s) / ED Diagnoses   Final diagnoses:  None    New Prescriptions New Prescriptions   No medications on file     Geoffery Lyonsouglas Shima Compere, MD 12/17/16 1155

## 2016-12-17 NOTE — ED Notes (Signed)
Dr Delo in w/pt. 

## 2016-12-17 NOTE — ED Notes (Signed)
TTS being performed.  

## 2016-12-17 NOTE — ED Notes (Signed)
Pt wanded by security - 3 labeled belongings bags placed at nurses' desk for inventory.

## 2016-12-17 NOTE — Discharge Instructions (Signed)
Follow-[facility] as scheduled and continue your medications as before.

## 2016-12-17 NOTE — ED Notes (Signed)
Pt lying on bed, alert. Urine specimen cup given to pt. - voiced understanding of need for specimen.

## 2016-12-17 NOTE — ED Notes (Signed)
Pt finishing eating breakfast.

## 2016-12-17 NOTE — ED Notes (Signed)
Cole Barrett[facility] aware of [facility].

## 2016-12-17 NOTE — BH Assessment (Addendum)
Tele Assessment Note [facility] with chief complaint of auditory hallucinations. Pt is cooperative and oriented x 4. He endorses AH that began last night. He says he heard voices "that got louder in my head." Pt reports the voices said "that they was going to kill me." Pt says he first experienced AH approx 3 years ago. Pt sts last year [place] was inpatient in [place]. Pt [facility] and began taking psych meds are directed yesterday. He reports no social support system. He reports euthymic mood and his affect is blunted. Pt denies SI currently or at any time in the past. Pt denies any history of suicide attempts or of self-mutilation. Pt denies homicidal thoughts or physical aggression. Pt denies having access to firearms. Pt denies having any legal problems at this time. Pt is calm and cooperative during assessment. No delusions noted. Pt has upcoming court date 12/29/16 for marijuana possession and driving with expired registration. He says he hasn't had meds in one week until yesterday. Pt often answered questions by saying, "Everything be cool" or "It is cool."  Cole HawkingChristopher Adam Barrett is an 31 y.o. male.   Diagnosis: Schizophrenia  Past Medical History:  Past Medical History:  Diagnosis Date  . Homelessness   . Hypercholesterolemia   . Mental disorder   . Paranoid behavior (HCC)   . PE (pulmonary embolism)   . Schizophrenia (HCC)     History reviewed. No pertinent surgical history.  Family History:  Family History  Problem Relation Age of Onset  . Hypertension Mother     Social History:  reports that he has been smoking Cigarettes.  He has been smoking about 0.00 packs per day. He has never used smokeless tobacco. He reports that he drinks alcohol. He reports that he uses drugs, including Marijuana.  Additional Social History:  Substance #1 Name of Substance 1: etoh 1 - Age of First Use: teens 1 - Amount (size/oz): 2 beers 1 - Frequency:  occasionally 1 - Duration: currently 1 - Last Use / Amount: 12/16/16 - one 12 oz beer Substance #2 Name of Substance 2: marijuana 2 - Age of First Use: teens 2 - Amount (size/oz): varies 2 - Frequency: once every 2 weeks 2 - Duration: currently 2 - Last Use / Amount: 12/17/15 - half of a blunt  CIWA: CIWA-Ar BP: 121/58 Pulse Rate: 81 COWS:    PATIENT STRENGTHS: (choose at least two) Average or above average intelligence Capable of independent living Communication skills Physical Health  Allergies:  Allergies  Allergen Reactions  . Haldol [Haloperidol] Shortness Of Breath    Home Medications:  (Not in a hospital admission)  OB/GYN Status:  No LMP for male patient.  General Assessment Data [facility] Assessment: In system Is this a Tele or Face-to-Face Assessment?: Tele Assessment Is this an Initial Assessment or a Re-assessment for this encounter?: Initial Assessment Marital status: Single Maiden name: none Is patient pregnant?: No Pregnancy Status: No Living Arrangements: Alone Can pt return to current living arrangement?: Yes Admission Status: Voluntary Is patient capable of signing voluntary admission?: Yes Referral Source: Self/Family/Friend Insurance type: [employer] med[employer]     Crisis Care Plan Living Arrangements: Alone [facility] Name of Therapist: none  Education Status Is patient currently in school?: No Highest grade of school patient has completed: 12  Risk to self with the past 6 months Suicidal Ideation: No Has patient been a risk to self within  the past 6 months prior to admission? : No Suicidal Intent: No Has patient had any suicidal intent within the past 6 months prior to admission? : No Is patient at risk for suicide?: No Suicidal Plan?: No Has patient had any suicidal plan within the past 6 months prior to admission? : No Access to Means: No What has been your use of drugs/alcohol within  the last 12 months?: occasional use of etoh, THC once every 2 weeks Previous Attempts/Gestures: No How many times?: 0 Other Self Harm Risks: none Triggers for Past Attempts: Hallucinations Intentional Self Injurious Behavior: None Family Suicide History: No Recent stressful life event(s): Other (Comment) (AH ) Persecutory voices/beliefs?: No Depression: No Depression Symptoms:  (pt denies) Substance abuse history and/or treatment for substance abuse?: No Suicide prevention information given to non-admitted patients: Not applicable  Risk to Others within the past 6 months Homicidal Ideation: No Does patient have any lifetime risk of violence toward others beyond the six months prior to admission? : No Thoughts of Harm to Others: No Current Homicidal Intent: No Current Homicidal Plan: No Access to Homicidal Means: No Identified Victim: none History of harm to others?: No Assessment of Violence: None Noted Violent Behavior Description: pt denies hx violence Does patient have access to weapons?: No Criminal Charges Pending?: Yes Describe Pending Criminal Charges: possession marijuana,driving expired registration Does patient have a court date: Yes Court Date: 12/29/16 Is patient on probation?: No  Psychosis Hallucinations: Auditory Delusions: None noted  Mental Status Report Appearance/Hygiene: In scrubs Eye Contact: Good Motor Activity: Freedom of movement Speech: Logical/coherent Level of Consciousness: Alert Mood: Euthymic Affect: Blunted Anxiety Level: None Thought Processes: Coherent, Relevant Judgement: Unimpaired Orientation: Person, Place, Time, Situation Obsessive Compulsive Thoughts/Behaviors: None  Cognitive Functioning Concentration: Normal Memory: Recent Intact, Remote Intact IQ: Average Insight: Poor Impulse Control: Fair Appetite: Good Sleep: No Change Total Hours of Sleep: 7 Vegetative Symptoms: None  ADLScreening [facility] Assessment  Services) Patient's cognitive ability adequate to safely complete daily activities?: Yes Patient able to express need for assistance with ADLs?: Yes Independently performs ADLs?: Yes (appropriate for developmental age)  Prior Inpatient Therapy Prior Inpatient Therapy: Yes Prior Therapy Dates: 2016 & 2017 Prior Therapy Facilty/Provider(s): [facility], in [place] Reason for Treatment: Schizophrenia   Prior Outpatient Therapy Prior Outpatient Therapy: Yes Prior Therapy Dates: just began Prior Therapy Facilty/Provider(s): [facility] Reason for Treatment: med management only, schizophrenia Does patient have an ACCT team?: No Does patient have Intensive In-House Services?  : No Does patient have [facility] services? : Yes Does patient have P4CC services?: Unknown  ADL Screening (condition at time of admission) Patient's cognitive ability adequate to safely complete daily activities?: Yes Is the patient deaf or have difficulty hearing?: No Does the patient have difficulty seeing, even when wearing glasses/contacts?: No Does the patient have difficulty concentrating, remembering, or making decisions?: No Patient able to express need for assistance with ADLs?: Yes Does the patient have difficulty dressing or bathing?: No Independently performs ADLs?: Yes (appropriate for developmental age) Does the patient have difficulty walking or climbing stairs?: No Weakness of Legs: None Weakness of Arms/Hands: None  Home Assistive Devices/Equipment Home Assistive Devices/Equipment: None    Abuse/Neglect Assessment (Assessment to be complete while patient is alone) Physical Abuse: Denies Verbal Abuse: Denies Sexual Abuse: Denies Exploitation of patient/patient's resources: Denies Self-Neglect: Denies     Merchant navy officer (For Healthcare) Does Patient Have a Medical Advance Directive?: No Would patient like information on creating a medical advance directive?: No -  Patient declined     Additional Information 1:1 In Past 12 Months?: No CIRT Risk: No Elopement Risk: No Does patient have medical clearance?: Yes     Disposition:  Disposition Initial Assessment Completed for this Encounter: Yes Disposition of Patient: Other dispositions Other disposition(s):  Elta Guadeloupe NP recommends discharge & f/up w/ [name])  Writer ran pt by Elta Guadeloupe NP who recommends pt be d/c as he doesn't meet inpatient criteria. Writer updated EDP Delo and RN Becky of disposition recommendation.  Marites Nath P 12/17/2016 11:16 AM

## 2016-12-23 ENCOUNTER — Encounter (HOSPITAL_COMMUNITY): Payer: Self-pay

## 2016-12-23 ENCOUNTER — Emergency Department (HOSPITAL_COMMUNITY): Payer: Medicaid Other

## 2016-12-23 ENCOUNTER — Emergency Department (HOSPITAL_COMMUNITY)
Admission: EM | Admit: 2016-12-23 | Discharge: 2016-12-23 | Disposition: A | Discharge status: Home / Self Care | Payer: Medicaid Other | Attending: Emergency Medicine | Admitting: Emergency Medicine

## 2016-12-23 DIAGNOSIS — Z79899 Other long term (current) drug therapy: Secondary | ICD-10-CM | POA: Diagnosis not present

## 2016-12-23 DIAGNOSIS — F17210 Nicotine dependence, cigarettes, uncomplicated: Secondary | ICD-10-CM | POA: Diagnosis not present

## 2016-12-23 DIAGNOSIS — Y999 Unspecified external cause status: Secondary | ICD-10-CM | POA: Diagnosis not present

## 2016-12-23 DIAGNOSIS — S93401A Sprain of unspecified ligament of right ankle, initial encounter: Secondary | ICD-10-CM

## 2016-12-23 DIAGNOSIS — Y929 Unspecified place or not applicable: Secondary | ICD-10-CM | POA: Insufficient documentation

## 2016-12-23 DIAGNOSIS — Z7901 Long term (current) use of anticoagulants: Secondary | ICD-10-CM | POA: Diagnosis not present

## 2016-12-23 DIAGNOSIS — X501XXA Overexertion from prolonged static or awkward postures, initial encounter: Secondary | ICD-10-CM | POA: Insufficient documentation

## 2016-12-23 DIAGNOSIS — S99911A Unspecified injury of right ankle, initial encounter: Secondary | ICD-10-CM | POA: Diagnosis present

## 2016-12-23 DIAGNOSIS — Y939 Activity, unspecified: Secondary | ICD-10-CM | POA: Insufficient documentation

## 2016-12-23 DIAGNOSIS — R52 Pain, unspecified: Secondary | ICD-10-CM

## 2016-12-23 MED ORDER — IBUPROFEN 400 MG PO TABS
600.0000 mg | ORAL_TABLET | Freq: Once | ORAL | Status: AC
  Administered 2016-12-23: 600 mg via ORAL
  Filled 2016-12-23: qty 1

## 2016-12-23 MED ORDER — IBUPROFEN 600 MG PO TABS: 600.0000 mg | ORAL_TABLET | Freq: Three times a day (TID) | ORAL | 0 refills | Status: DC | PRN

## 2016-12-23 NOTE — ED Triage Notes (Signed)
Pt states that he hurt his R ankle and he wants some pain pills for it. Pt is ambulatory, no injury or deformity noted. Pt states he would also like a Sandwich.

## 2016-12-23 NOTE — ED Notes (Signed)
Unable to sign esignature due to computer not working

## 2016-12-23 NOTE — ED Provider Notes (Signed)
[facility]PT Provider Note   CSN: [id number] Arrival date & time: 12/23/16  4782   By signing my name below, I, [name], attest that this documentation has been prepared under the direction and in the presence of Pricilla Loveless, MD  Electronically Signed: [name], ED Scribe. 12/23/16. 3:27 AM.   History   Chief Complaint Chief Complaint  Patient presents with  . Ankle Pain   The history is provided by the patient. No language interpreter was used.   HPI Comments:  Cole Barrett is a 31 y.o. male, with a hx of PE, who presents to the Emergency Department complaining of moderate right ankle pain x 1 week. Pt states he twisted his ankle but denies falling. He also reports gradually improving swelling. His pain is worse upon palpation. He has taken tylenol with no relief. Pt is ambulatory. No other associated symptoms noted. Pt states he has not taken Xarelto in several months. Asking for a pain pill, gingerale and sandwich.  Past Medical History:  Diagnosis Date  . Homelessness   . Hypercholesterolemia   . Mental disorder   . Paranoid behavior (HCC)   . PE (pulmonary embolism)   . Schizophrenia [facility])     Patient Active Problem List   Diagnosis Date Noted  . Alcohol abuse   . Auditory hallucination   . Alcohol abuse with alcohol-induced mood disorder (HCC) 09/08/2016  . Adjustment disorder with depressed mood 09/07/2016  . Homelessness 09/03/2016  . Person feigning illness 09/03/2016  . 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 Cannabis use disorder, mild, abuse 08/29/2016  . Paranoid schizophrenia (HCC) 08/26/2016  . Pulmonary emboli (HCC) 07/13/2016  . 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 Acute left flank pain 01/02/2014  . Pulmonary embolism, bilateral (HCC) 01/02/2014    History reviewed. No pertinent surgical history.     Home Medications    Prior to Admission medications   Medication Sig Start Date End Date Taking?  Authorizing Provider  acetaminophen (TYLENOL) 500 MG tablet Take 1 tablet (500 mg total) by mouth every 6 (six) hours as needed. Patient not taking: Reported on 12/09/2016 11/03/16   [name], PA-C  benztropine (COGENTIN) 0.5 MG tablet Take 1 tablet (0.5 mg total) by mouth 2 (two) times daily. Patient not taking: Reported on 12/09/2016 11/23/16   Marlon Pel, PA-C  benztropine (COGENTIN) 1 MG tablet Take 0.5 tablets (0.5 mg total) by mouth 2 (two) times daily. Patient not taking: Reported on 12/09/2016 11/20/16   Donnita Falls Street, PA-C  Diclofenac 35 MG CAPS Take 35 mg by mouth 3 (three) times daily. Patient not taking: Reported on 12/09/2016 11/21/16   Marlon Pel, PA-C  pravastatin (PRAVACHOL) 20 MG tablet Take 1 tablet (20 mg total) by mouth daily. Patient not taking: Reported on 12/09/2016 09/25/16   Marlon Pel, PA-C  predniSONE (DELTASONE) 10 MG tablet Take 2 tablets (20 mg total) by mouth 2 (two) times daily with a meal. Patient not taking: Reported on 12/09/2016 11/26/16   Janne Napoleon, NP  risperiDONE (RISPERDAL) 1 MG tablet Take 1 tablet (1 mg total) by mouth every morning. Patient not taking: Reported on 12/09/2016 11/16/16   [name], MD  risperiDONE (RISPERDAL) 1 MG tablet Take 1 tablet (1mg ) PO every morning and 3 tablets (3mg ) PO QHS Patient not taking: Reported on 12/09/2016 11/20/16   Donnita Falls Street, PA-C  risperiDONE (RISPERDAL) 3 MG tablet Take 1 tablet (3 mg total) by mouth at bedtime. Patient not taking: Reported  on 12/09/2016 11/23/16   Marlon Pel, PA-C  rivaroxaban (XARELTO) 20 MG TABS tablet Take 1 tablet (20 mg total) by mouth daily with supper. Patient not taking: Reported on 12/09/2016 11/03/16   [name], PA-C  traZODone (DESYREL) 50 MG tablet Take 1-2 tablets (50-100mg ) PO at bedtime PRN sleep Patient not taking: Reported on 12/09/2016 11/20/16   Donnita Falls Street, PA-C    Family History Family History  Problem Relation Age of Onset    . Hypertension Mother     Social History Social History  Substance Use Topics  . Smoking status: Current Every Day Smoker    Packs/day: 0.00    Types: Cigarettes  . Smokeless tobacco: Never Used  . Alcohol use Yes     Allergies   Haldol [haloperidol]   Review of Systems Review of Systems  Musculoskeletal: Positive for arthralgias, joint swelling and myalgias.  Neurological: Negative for numbness.  All other systems reviewed and are negative.  Physical Exam Updated Vital Signs BP 118/80 (BP Location: Right Arm)   Pulse 68   Temp 97.3 F (36.3 C) (Oral)   Resp 18   Ht 5\' 10"  (1.778 m)   Wt 223 lb (101.2 kg)   SpO2 99%   BMI 32.00 kg/m   Physical Exam  Constitutional: He is oriented to person, place, and time. He appears well-developed and well-nourished.  HENT:  Head: Normocephalic and atraumatic.  Right Ear: External ear normal.  Left Ear: External ear normal.  Nose: Nose normal.  Eyes: Right eye exhibits no discharge. Left eye exhibits no discharge.  Neck: Neck supple.  Cardiovascular: Normal rate and regular rhythm.   Pulses:      Dorsalis pedis pulses are 2+ on the right side, and 2+ on the left side.  Pulmonary/Chest: Effort normal.  Abdominal: Soft. There is no tenderness.  Musculoskeletal: He exhibits no edema.       Right ankle: He exhibits normal range of motion and no swelling. Tenderness.       Right lower leg: He exhibits no tenderness and no swelling.       Right foot: There is no tenderness.  Neurological: He is alert and oriented to person, place, and time.  Skin: Skin is warm and dry.  Nursing note and vitals reviewed.   ED Treatments / Results  DIAGNOSTIC STUDIES:  Oxygen Saturation is 99% on RA, normal by my interpretation.    COORDINATION OF CARE:  3:26 AM Discussed treatment plan with pt at bedside and pt agreed to plan.  Labs (all labs ordered are listed, but only abnormal results are displayed) Labs Reviewed - No data to  display  EKG  EKG Interpretation None       Radiology Dg Ankle Complete Right  Result Date: 12/23/2016 CLINICAL DATA:  Twisting injury with right ankle pain. EXAM: RIGHT ANKLE - COMPLETE 3+ VIEW COMPARISON:  11/30/2016 FINDINGS: Soft tissue swelling about the malleoli more so laterally. Ankle mortise appears intact. The ankle and subtalar joints are maintained as are the midfoot articulations. No acute fracture noted. IMPRESSION: Soft tissue swelling without acute osseous abnormality. Electronically Signed   By: Tollie Eth M.D.   On: 12/23/2016 01:23    Procedures Procedures (including critical care time)  Medications Ordered in ED Medications - No data to display   Initial Impression / Assessment and Plan / ED Course  I have reviewed the triage vital signs and the nursing notes.  Pertinent labs & imaging results that were available during my  care of the patient were reviewed by me and considered in my medical decision making (see chart for details).     No significant swelling noted. Patient is neurovascular intact. Will treat with ibuprofen given he has been off Xarelto for several months, use ice, rest, and elevation at home. Given he is ambulatory have low suspicion for significant sprain. Highly doubt occult fracture.  Final Clinical Impressions(s) / ED Diagnoses   Final diagnoses:  Sprain of right ankle, unspecified ligament, initial encounter    New Prescriptions New Prescriptions   No medications on file   I personally performed the services described in this documentation, which was scribed in my presence. The recorded information has been reviewed and is accurate.     Pricilla LovelessScott Nerida Boivin, MD 12/23/16 [phone]

## 2016-12-29 ENCOUNTER — Encounter (HOSPITAL_COMMUNITY): Payer: Self-pay | Admitting: *Deleted

## 2016-12-29 ENCOUNTER — Emergency Department (HOSPITAL_COMMUNITY)
Admission: EM | Admit: 2016-12-29 | Discharge: 2016-12-30 | Disposition: A | Discharge status: Home / Self Care | Payer: Medicaid Other | Attending: Emergency Medicine | Admitting: Emergency Medicine

## 2016-12-29 DIAGNOSIS — Z79899 Other long term (current) drug therapy: Secondary | ICD-10-CM | POA: Insufficient documentation

## 2016-12-29 DIAGNOSIS — R440 Auditory hallucinations: Secondary | ICD-10-CM | POA: Diagnosis not present

## 2016-12-29 DIAGNOSIS — F4321 Adjustment disorder with depressed mood: Secondary | ICD-10-CM | POA: Diagnosis not present

## 2016-12-29 DIAGNOSIS — R45851 Suicidal ideations: Secondary | ICD-10-CM | POA: Diagnosis not present

## 2016-12-29 DIAGNOSIS — F17210 Nicotine dependence, cigarettes, uncomplicated: Secondary | ICD-10-CM | POA: Insufficient documentation

## 2016-12-29 DIAGNOSIS — Z8249 Family history of ischemic heart disease and other diseases of the circulatory system: Secondary | ICD-10-CM | POA: Diagnosis not present

## 2016-12-29 DIAGNOSIS — F1010 Alcohol abuse, uncomplicated: Secondary | ICD-10-CM | POA: Diagnosis not present

## 2016-12-29 LAB — COMPREHENSIVE METABOLIC PANEL
ALK PHOS: 55 U/L (ref 38–126)
ALT: 30 U/L (ref 17–63)
AST: 22 U/L (ref 15–41)
Albumin: 4 g/dL (ref 3.5–5.0)
Anion gap: 10 (ref 5–15)
BUN: 9 mg/dL (ref 6–20)
CALCIUM: 8.7 mg/dL — AB (ref 8.9–10.3)
CHLORIDE: 106 mmol/L (ref 101–111)
CO2: 24 mmol/L (ref 22–32)
CREATININE: 1.09 mg/dL (ref 0.61–1.24)
GFR calc Af Amer: >60 mL/min (ref >60)
Glucose, Bld: 87 mg/dL (ref 65–99)
Potassium: 3.9 mmol/L (ref 3.5–5.1)
Sodium: 140 mmol/L (ref 135–145)
Total Bilirubin: 0.4 mg/dL (ref 0.3–1.2)
Total Protein: 7 g/dL (ref 6.5–8.1)

## 2016-12-29 LAB — RAPID URINE DRUG SCREEN, HOSP PERFORMED
AMPHETAMINES: NOT DETECTED
Barbiturates: NOT DETECTED
Benzodiazepines: NOT DETECTED
Cocaine: NOT DETECTED
Opiates: NOT DETECTED
TETRAHYDROCANNABINOL: POSITIVE — AB

## 2016-12-29 LAB — SALICYLATE LEVEL: Salicylate Lvl: <7 mg/dL (ref 2.8–30.0)

## 2016-12-29 LAB — CBC
HCT: 38.3 % — ABNORMAL LOW (ref 39.0–52.0)
HEMOGLOBIN: 12.5 g/dL — AB (ref 13.0–17.0)
MCH: 27.5 pg (ref 26.0–34.0)
MCHC: 32.6 g/dL (ref 30.0–36.0)
MCV: 84.2 fL (ref 78.0–100.0)
PLATELETS: 307 10*3/uL (ref 150–400)
RBC: 4.55 MIL/uL (ref 4.22–5.81)
RDW: 13.9 % (ref 11.5–15.5)
WBC: 5.5 10*3/uL (ref 4.0–10.5)

## 2016-12-29 LAB — ACETAMINOPHEN LEVEL: Acetaminophen (Tylenol), Serum: <10 ug/mL — ABNORMAL LOW (ref 10–30)

## 2016-12-29 LAB — ETHANOL: Alcohol, Ethyl (B): 43 mg/dL — ABNORMAL HIGH (ref <5)

## 2016-12-29 MED ORDER — FLUOXETINE HCL 20 MG PO CAPS
20.0000 mg | ORAL_CAPSULE | Freq: Every day | ORAL | Status: DC
  Administered 2016-12-29 – 2016-12-30 (×2): 20 mg via ORAL
  Filled 2016-12-29 (×2): qty 1

## 2016-12-29 MED ORDER — RISPERIDONE 3 MG PO TABS
3.0000 mg | ORAL_TABLET | Freq: Every day | ORAL | Status: DC
Start: 2016-12-29 — End: 2016-12-30
  Administered 2016-12-29: 3 mg via ORAL
  Filled 2016-12-29: qty 1

## 2016-12-29 MED ORDER — RISPERIDONE 1 MG PO TABS
1.0000 mg | ORAL_TABLET | ORAL | Status: DC
  Administered 2016-12-30: 1 mg via ORAL
  Filled 2016-12-29: qty 1

## 2016-12-29 NOTE — BH Assessment (Signed)
Tele Assessment Note   Cole Barrett is a 31 y.o. male who [facility]. Pt states all he wants is for the voices in his head to stop. PT states he has been dealing with these voices for a very long time. Pt states voices are telling him to kill himself or someone else. Pt states he can no longer contract for safety by going home alone. He is afraid of what he might do if he keeps listening to the voices.   Pt denies H/I, abuse history, and visual hallucinations. Pt is not receiving any medication management or outpatient services at this time.  Pt states [facility]. Pt has no more family support.                                                                                                                                                                                                                                                                       Pt was dressed in hospital gown but appeared disheveled. Pt was alert and oriented x4. Pt's thought process was impaired and could not go into detail about anything else or than the voices.    Diagnosis: Schizophrenia (per history  Past Medical History:  Past Medical History:  Diagnosis Date  . Homelessness   . Hypercholesterolemia   . Mental disorder   . Paranoid behavior (HCC)   . PE (pulmonary embolism)   . Schizophrenia (HCC)     History reviewed. No pertinent surgical history.  Family History:  Family History  Problem Relation Age of Onset  . Hypertension Mother     Social History:  reports that he has been smoking Cigarettes.  He has been smoking about 0.00 packs per day. He has never used smokeless tobacco. He reports that he drinks alcohol. He reports that he uses drugs, including Marijuana.  Additional Social History:  Alcohol / Drug Use Pain Medications: See PTA meds  Prescriptions: See PTA meds  Over the Counter: See PTA meds  History of alcohol / drug  use?: Yes Substance #1 Name of Substance 1: etoh 1 - Age of First Use: teens 1 - Amount (size/oz): 2 beers 1 - Frequency: occasionally 1 -  Duration: currently Substance #2 Name of Substance 2: marijuana 2 - Age of First Use: teens 2 - Amount (size/oz): varies 2 - Frequency: once every 2 weeks 2 - Duration: currently  CIWA: CIWA-Ar BP: 148/77 Pulse Rate: 92 COWS:    PATIENT STRENGTHS: (choose at least two) Ability for insight Average or above average intelligence Capable of independent living General fund of knowledge Motivation for treatment/growth Physical Health  Allergies:  Allergies  Allergen Reactions  . Haldol [Haloperidol] Shortness Of Breath    Home Medications:  (Not in a hospital admission)  OB/GYN Status:  No LMP for male patient.  General Assessment Data Location of Assessment: [facility] TTS Assessment: In system Is this a Tele or Face-to-Face Assessment?: Tele Assessment Is this an Initial Assessment or a Re-assessment for this encounter?: Initial Assessment Marital status: Single Maiden name: na Is patient pregnant?: No Pregnancy Status: No Living Arrangements: Alone Can pt return to current living arrangement?: Yes Admission Status: Voluntary Is patient capable of signing voluntary admission?: Yes Referral Source: Self/Family/Friend Insurance type: Medicaid      Crisis Care Plan Living Arrangements: Alone Name of Psychiatrist: Transport planner Name of Therapist: none  Education Status Is patient currently in school?: No Highest grade of school patient has completed: 12  Risk to self with the past 6 months Suicidal Ideation: No Has patient been a risk to self within the past 6 months prior to admission? : No Suicidal Intent: No Has patient had any suicidal intent within the past 6 months prior to admission? : No Is patient at risk for suicide?: No Suicidal Plan?: No Has patient had any suicidal plan within the past 6 months prior to admission?  : No Access to Means: No What has been your use of drugs/alcohol within the last 12 months?: ongoing  Previous Attempts/Gestures: No How many times?: 0 Other Self Harm Risks: none Triggers for Past Attempts: Hallucinations Intentional Self Injurious Behavior: None Persecutory voices/beliefs?: No Depression: Yes Depression Symptoms: Tearfulness Substance abuse history and/or treatment for substance abuse?: No Suicide prevention information given to non-admitted patients: Not applicable  Risk to Others within the past 6 months Homicidal Ideation: No Does patient have any lifetime risk of violence toward others beyond the six months prior to admission? : No Thoughts of Harm to Others: No Comment - Thoughts of Harm to Others: na Current Homicidal Intent: No Current Homicidal Plan: No Access to Homicidal Means: No Identified Victim: na History of harm to others?: No Assessment of Violence: None Noted Violent Behavior Description: pt denies Does patient have access to weapons?: No Criminal Charges Pending?: Yes Describe Pending Criminal Charges: possession of marjuana and expired registration  Does patient have a court date: Yes Court Date: 12/29/16 Is patient on probation?: No  Psychosis Hallucinations: Auditory Delusions: None noted  Mental Status Report Appearance/Hygiene: In scrubs Eye Contact: Good Motor Activity: Freedom of movement Speech: Logical/coherent Level of Consciousness: Alert Mood: Depressed Affect: Blunted Anxiety Level: None Thought Processes: Coherent Judgement: Impaired Orientation: Person, Place, Time, Situation Obsessive Compulsive Thoughts/Behaviors: None  Cognitive Functioning Concentration: Normal Memory: Recent Intact IQ: Average Insight: Poor Impulse Control: Fair Appetite: Fair Weight Loss: 0 Weight Gain: 0 Sleep: No Change Total Hours of Sleep: 8  ADLScreening [facility] Assessment Services) Patient's cognitive ability adequate to  safely complete daily activities?: Yes Patient able to express need for assistance with ADLs?: Yes Independently performs ADLs?: Yes (appropriate for developmental age)  Prior Inpatient Therapy Prior Inpatient Therapy: Yes Prior Therapy Dates: 2016 & 2017  Prior Therapy Facilty/Provider(s): [facility], in [place] Reason for Treatment: Schizophrenia   Prior Outpatient Therapy Prior Outpatient Therapy: Yes Prior Therapy Dates: just began Prior Therapy Facilty/Provider(s): [facility] Reason for Treatment: med management only, schizophrenia Does patient have an ACCT team?: No Does patient have Intensive In-House Services?  : No Does patient have [facility] services? : No Does patient have P4CC services?: No  ADL Screening (condition at time of admission) Patient's cognitive ability adequate to safely complete daily activities?: Yes Is the patient deaf or have difficulty hearing?: No Does the patient have difficulty seeing, even when wearing glasses/contacts?: No Does the patient have difficulty concentrating, remembering, or making decisions?: No Patient able to express need for assistance with ADLs?: Yes Does the patient have difficulty dressing or bathing?: No Independently performs ADLs?: Yes (appropriate for developmental age) Does the patient have difficulty walking or climbing stairs?: No Weakness of Legs: None Weakness of Arms/Hands: None  Home Assistive Devices/Equipment Home Assistive Devices/Equipment: None    Abuse/Neglect Assessment (Assessment to be complete while patient is alone) Physical Abuse: Denies Verbal Abuse: Denies Sexual Abuse: Denies Exploitation of patient/patient's resources: Denies Self-Neglect: Denies Values / Beliefs Cultural Requests During Hospitalization: None Spiritual Requests During Hospitalization: None   Advance Directives (For Healthcare) Does Patient Have a Medical Advance Directive?: No Would patient like information on creating a medical  advance directive?: No - Patient declined    Additional Information 1:1 In Past 12 Months?: Yes CIRT Risk: No Elopement Risk: No Does patient have medical clearance?: Yes     Disposition: Gave clinical report to Donell Sievert, NP who states pt mees inpatient criteria. No beds available at [facility] currently. TTS will seek outside placement. Notified Marquita Palms, RN of decision.     Morrie Sheldon n Arie Sabina 12/29/2016 6:13 AM

## 2016-12-29 NOTE — ED Provider Notes (Signed)
TIME SEEN: 4:25 AM  CHIEF COMPLAINT: Auditory hallucinations  HPI: Pt is a 31 y.o. male with history of pulmonary embolus, schizophrenia, homelessness who presents emergency department with complaints of command hallucinations. He states that this is been ongoing for quite some time but worse over the past several days. He states that they are telling him to kill himself and sometimes other people. No visual hallucinations. Did drink some alcohol today but no drug use. Has no current medical complaints. He states he thinks he needs inpatient psychiatric treatment. Reports compliance with his Risperdal but cannot recover what dose he is taking. Was seen here in January for similar symptoms and did not meet inpatient criteria but they restarted his psychiatric medicine. He states he is not on Cogentin. He denies that he is on Xarelto.  ROS: See HPI Constitutional: no fever  Eyes: no drainage  ENT: no runny nose   Cardiovascular:  no chest pain  Resp: no SOB  GI: no vomiting or diarrhea GU: no dysuria Integumentary: no rash  Allergy: no hives  Musculoskeletal: no leg swelling  Neurological: no slurred speech ROS otherwise negative  PAST MEDICAL HISTORY/PAST SURGICAL HISTORY:  Past Medical History:  Diagnosis Date  . Homelessness   . Hypercholesterolemia   . Mental disorder   . Paranoid behavior (HCC)   . PE (pulmonary embolism)   . Schizophrenia (HCC)     MEDICATIONS:  Prior to Admission medications   Medication Sig Start Date End Date Taking? Authorizing Provider  acetaminophen (TYLENOL) 500 MG tablet Take 1 tablet (500 mg total) by mouth every 6 (six) hours as needed. Patient not taking: Reported on 12/09/2016 11/03/16   Antony MaduraKelly Humes, PA-C  benztropine (COGENTIN) 0.5 MG tablet Take 1 tablet (0.5 mg total) by mouth 2 (two) times daily. Patient not taking: Reported on 12/09/2016 11/23/16   Marlon Peliffany Greene, PA-C  benztropine (COGENTIN) 1 MG tablet Take 0.5 tablets (0.5 mg total) by mouth  2 (two) times daily. Patient not taking: Reported on 12/09/2016 11/20/16   Mercedes Street, PA-C  Diclofenac 35 MG CAPS Take 35 mg by mouth 3 (three) times daily. Patient not taking: Reported on 12/09/2016 11/21/16   Marlon Peliffany Greene, PA-C  ibuprofen (ADVIL,MOTRIN) 600 MG tablet Take 1 tablet (600 mg total) by mouth every 8 (eight) hours as needed. 12/23/16   Pricilla LovelessScott Goldston, MD  pravastatin (PRAVACHOL) 20 MG tablet Take 1 tablet (20 mg total) by mouth daily. Patient not taking: Reported on 12/09/2016 09/25/16   Marlon Peliffany Greene, PA-C  predniSONE (DELTASONE) 10 MG tablet Take 2 tablets (20 mg total) by mouth 2 (two) times daily with a meal. Patient not taking: Reported on 12/09/2016 11/26/16   Janne NapoleonHope M Neese, NP  risperiDONE (RISPERDAL) 1 MG tablet Take 1 tablet (1 mg total) by mouth every morning. Patient not taking: Reported on 12/09/2016 11/16/16   Dione Boozeavid Glick, MD  risperiDONE (RISPERDAL) 1 MG tablet Take 1 tablet (1mg ) PO every morning and 3 tablets (3mg ) PO QHS Patient not taking: Reported on 12/09/2016 11/20/16   Mercedes Street, PA-C  risperiDONE (RISPERDAL) 3 MG tablet Take 1 tablet (3 mg total) by mouth at bedtime. Patient not taking: Reported on 12/09/2016 11/23/16   Marlon Peliffany Greene, PA-C  rivaroxaban (XARELTO) 20 MG TABS tablet Take 1 tablet (20 mg total) by mouth daily with supper. Patient not taking: Reported on 12/09/2016 11/03/16   Antony MaduraKelly Humes, PA-C  traZODone (DESYREL) 50 MG tablet Take 1-2 tablets (50-100mg ) PO at bedtime PRN sleep Patient not taking: Reported on  12/09/2016 11/20/16   Mercedes Street, PA-C    ALLERGIES:  Allergies  Allergen Reactions  . Haldol [Haloperidol] Shortness Of Breath    SOCIAL HISTORY:  Social History  Substance Use Topics  . Smoking status: Current Every Day Smoker    Packs/day: 0.00    Types: Cigarettes  . Smokeless tobacco: Never Used  . Alcohol use Yes    FAMILY HISTORY: Family History  Problem Relation Age of Onset  . Hypertension Mother      EXAM: BP 148/77   Pulse 92   Temp 97.8 F (36.6 C) (Oral)   Resp 16   SpO2 99%  CONSTITUTIONAL: Alert and oriented and responds appropriately to questions. Chronically ill-appearing; well-nourished HEAD: Normocephalic EYES: Conjunctivae clear, PERRL, EOMI ENT: normal nose; no rhinorrhea; moist mucous membranes NECK: Supple, no meningismus, no nuchal rigidity, no LAD  CARD: RRR; S1 and S2 appreciated; no murmurs, no clicks, no rubs, no gallops RESP: Normal chest excursion without splinting or tachypnea; breath sounds clear and equal bilaterally; no wheezes, no rhonchi, no rales, no hypoxia or respiratory distress, speaking full sentences ABD/GI: Normal bowel sounds; non-distended; soft, non-tender, no rebound, no guarding, no peritoneal signs, no hepatosplenomegaly BACK:  The back appears normal and is non-tender to palpation, there is no CVA tenderness EXT: Normal ROM in all joints; non-tender to palpation; no edema; normal capillary refill; no cyanosis, no calf tenderness or swelling    SKIN: Normal color for age and race; warm; no rash NEURO: Moves all extremities equally, sensation to light touch intact diffusely, cranial nerves II through XII intact, normal speech PSYCH: Bizarre affect. Endorses command hallucinations. Unable to contract for safety. No current HI. Reports suicidal thoughts.  MEDICAL DECISION MAKING: Patient here with suicidal thoughts, command hallucinations. No medical complaints. Medical workup unremarkable other than mildly elevated alcohol level. Seen in the past for similar symptoms and was not recommended to be placed in inpatient treatment he states he feels he would benefit from this. Has been on Risperdal and reports compliance. Cannot remember his dose. We'll discuss with TTS for further evaluation and further recommendations. This time he is voluntary.  ED PROGRESS: 6:30 AM  Per TTS note, they recommended inpatient psychiatric treatment. I agree with this  plan. They are looking for placement.   I reviewed all nursing notes, vitals, pertinent old records, EKGs, labs, imaging (as available).      Layla Maw Albertine Lafoy, DO 12/29/16 [phone]

## 2016-12-29 NOTE — ED Notes (Signed)
TTS assessment underway.  

## 2016-12-29 NOTE — ED Notes (Addendum)
Pt reports he is "still hearing voices that are telling me to kill myself." Pt appears calm and oriented, no threatening behavior noted to self/others. Will continue to monitor and counsel pt as needed.

## 2016-12-29 NOTE — ED Notes (Signed)
Patient was given a snack and drink, and a Regular diet was ordered for Lunch.

## 2016-12-29 NOTE — ED Notes (Addendum)
Pt sleeping, NAD, calm, rise and fall of chest noted, repositions self, sitter present.

## 2016-12-29 NOTE — ED Notes (Signed)
Pt given meal tray.

## 2016-12-29 NOTE — ED Notes (Signed)
Pt ambulatory to b/r, steady gait, sitter present, NAD, calm.

## 2016-12-29 NOTE — ED Notes (Signed)
Pt sleeping, NAD, calm, rise and fall of chest noted, repositions self, sitter present, breakfast arrived to room.

## 2016-12-29 NOTE — ED Triage Notes (Signed)
Pt hearing voices telling pt to kill himself for over a year. Pt seen here several times for the same.

## 2016-12-29 NOTE — ED Notes (Addendum)
Pt awakened by staff to move to room FT 8. No changes, ambulatory, steady gait, cooperative, calm, NAD. Sitter present. Belongings moved with pt, locked in new room cabinets.

## 2016-12-29 NOTE — ED Notes (Signed)
[place], [place]. Pt watching TV, alert, interactive, NAD, calm, sitter present, [facility], pt up to b/r, steady gait, verbalizes agreeable to shower later this morning.

## 2016-12-29 NOTE — ED Notes (Signed)
Pt sleeping, NAD, calm, rise and fall of chest noted, repositions self, sitter present.  

## 2016-12-29 NOTE — ED Notes (Signed)
TTS assessment complete. Inpatient placement recommended.

## 2016-12-29 NOTE — BH Assessment (Addendum)
Re-Assessment   Patient reports that he continues to hear voices telling him to kill himself.  Patient reports that he is non compliant with taking his psychiatric medication. Patient is well known to the ED.  Patient reports prior inpatient psychiatric hospitalizations.   Per Jacki ConesLaurie, patient will remain in the ED and will be re-assessed in the morning for a final disposition.  Per Jacki ConesLaurie, NP - The patient will be begin taking 20 mg prozac daily and add 1mg  Risperidone every morning.   Writer informed the ER RN Marylene Land(Angela).  ER Marylene Land(Angela) will inform the ER MD of the disposition.

## 2016-12-29 NOTE — ED Notes (Signed)
Bagged and labeled pt belongings locked in room cabinet.

## 2016-12-29 NOTE — ED Notes (Signed)
Staffing office contacted for sitter; pt provided wine colored scrubs to change into

## 2016-12-30 DIAGNOSIS — F4321 Adjustment disorder with depressed mood: Secondary | ICD-10-CM

## 2016-12-30 DIAGNOSIS — Z8249 Family history of ischemic heart disease and other diseases of the circulatory system: Secondary | ICD-10-CM | POA: Diagnosis not present

## 2016-12-30 DIAGNOSIS — Z79899 Other long term (current) drug therapy: Secondary | ICD-10-CM | POA: Diagnosis not present

## 2016-12-30 DIAGNOSIS — R440 Auditory hallucinations: Secondary | ICD-10-CM | POA: Diagnosis not present

## 2016-12-30 DIAGNOSIS — F17210 Nicotine dependence, cigarettes, uncomplicated: Secondary | ICD-10-CM

## 2016-12-30 DIAGNOSIS — Z888 Allergy status to other drugs, medicaments and biological substances status: Secondary | ICD-10-CM

## 2016-12-30 DIAGNOSIS — F1010 Alcohol abuse, uncomplicated: Secondary | ICD-10-CM

## 2016-12-30 MED ORDER — FLUOXETINE HCL 20 MG PO TABS: 20.0000 mg | ORAL_TABLET | Freq: Every day | ORAL | 0 refills | Status: DC

## 2016-12-30 MED ORDER — RISPERIDONE 3 MG PO TABS
3.0000 mg | ORAL_TABLET | Freq: Every day | ORAL | 0 refills | Status: DC
Start: 2016-12-30 — End: 2017-01-22

## 2016-12-30 MED ORDER — RISPERIDONE 3 MG PO TABS: 3.0000 mg | ORAL_TABLET | Freq: Every day | ORAL | 0 refills | Status: DC

## 2016-12-30 NOTE — Consult Note (Signed)
Telepsych Consultation   Reason for Consult:  Auditory hallucinations Referring Physician:  EDP Patient Identification: Cole Barrett MRN:  [id number] Principal Diagnosis: Auditory hallucination  Diagnosis:   Patient Active Problem List   Diagnosis Date Noted  . Alcohol abuse [F10.10]   . Auditory hallucination [R44.0]   . Alcohol abuse with alcohol-induced mood disorder ([place]) [F10.14] 09/08/2016  . Adjustment disorder with depressed mood [F43.21] 09/07/2016  . Homelessness [Z59.0] 09/03/2016  . Person feigning illness [Z76.5] 09/03/2016  . 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 Cannabis use disorder, mild, abuse [F12.10] 08/29/2016  . Paranoid schizophrenia ([place]) [F20.0] 08/26/2016  . Pulmonary emboli ([place]) [I26.99] 07/13/2016  . Pulmonary embolism and infarction ([place]) [I26.99] 01/02/2014  . Acute left flank pain [R10.9] 01/02/2014  . Pulmonary embolism, bilateral ([place]) [I26.99] 01/02/2014    Total Time spent with patient: 30 minutes  Subjective:   Cole Barrett is a 31 y.o. male patient admitted with auditory hallucinations.  HPI:  Per tele assessment notes on chart written by Murtis Sink, [facility] Counselor on 12/29/16 at 6:12 am and re-assessment note written by Graciella Freer, [facility] Counselor on 12/29/16 at 2:57pm: Cole Barrett's note: Cole Barrett is a 31 y.o. male who presents voluntarily to [facility]. Pt states all he wants is for the voices in his head to stop. PT states he has been dealing with these voices for a very long time. Pt states voices are telling him to kill himself or someone else. Pt states he can no longer contract for safety by going home alone. He is afraid of what he might do if he keeps listening to the voices.   Pt denies H/I, abuse history, and visual hallucinations. Pt is not receiving any medication management or outpatient services at this time.  Pt states he was admitted last year to [facility] for similar issues. Pt has no more family  support.                                                                            Pt was dressed in hospital gown but appeared disheveled. Pt was alert and oriented x4. Pt's thought process was impaired and could not go into detail about anything else or than the voices.   Ava's re-assessment:  Patient reports that he continues to hear voices telling him to kill himself.  Patient reports that he is non compliant with taking his psychiatric medication. Patient is well known to the ED.  Patient reports prior inpatient psychiatric hospitalizations.   Per Margarita Grizzle, patient will remain in the ED and will be re-assessed in the morning for a final disposition.  Per Margarita Grizzle, NP - The patient will be begin taking 20 mg prozac daily and add 110m Risperidone every morning.   Today during tele psych consult:  Pt was seen and chart reviewed. Pt was calm and cooperative, alert & oriented x 3, dressed in paper scrubs and [facility] Pt endorses auditory hallucinations but states "they are quieter today since I was started back on my medication."  Pt denies suicidal/homicidal ideation, denies visual hallucinations and does not appear to be responding to internal stimuli. Pt states "I feel better today and the voices are  quieter since my meds were restarted. Pt stated he currently does not have therapy or psychiatry in place but would be interested in a referral for these services.   Past Psychiatric History: Depression, auditory hallucinations, schizophrenia  Risk to Self: Suicidal Ideation: No Suicidal Intent: No Is patient at risk for suicide?: No Suicidal Plan?: No Access to Means: No What has been your use of drugs/alcohol within the last 12 months?: ongoing  How many times?: 0 Other Self Harm Risks: none Triggers for Past Attempts: Hallucinations Intentional Self Injurious Behavior: None Risk to Others: Homicidal Ideation: No Thoughts of Harm to Others: No Comment - Thoughts of  Harm to Others: na Current Homicidal Intent: No Current Homicidal Plan: No Access to Homicidal Means: No Identified Victim: na History of harm to others?: No Assessment of Violence: None Noted Violent Behavior Description: pt denies Does patient have access to weapons?: No Criminal Charges Pending?: Yes Describe Pending Criminal Charges: possession of marjuana and expired registration  Does patient have a court date: Yes Court Date: 12/29/16 Prior Inpatient Therapy: Prior Inpatient Therapy: Yes Prior Therapy Dates: 2016 & 2017 Prior Therapy Facilty/Provider(s): [facility], in [place] Reason for Treatment: Schizophrenia  Prior Outpatient Therapy: Prior Outpatient Therapy: Yes Prior Therapy Dates: just began Prior Therapy Facilty/Provider(s): [facility] Reason for Treatment: med management only, schizophrenia Does patient have an ACCT team?: No Does patient have Intensive In-House Services?  : No Does patient have [facility] services? : No Does patient have [facility] services?: No  Past Medical History:  Past Medical History:  Diagnosis Date  . Homelessness   . Hypercholesterolemia   . Mental disorder   . Paranoid behavior ([place])   . PE (pulmonary embolism)   . Schizophrenia ([place])    History reviewed. No pertinent surgical history. Family History:  Family History  Problem Relation Age of Onset  . Hypertension Mother    Family Psychiatric  History: Unknown Social History:  History  Alcohol Use  . Yes     History  Drug Use  . Types: Marijuana    Comment: Once every 2-3 months, Last used: Last month    Social History   Social History  . Marital status: Single    Spouse name: N/A  . Number of children: N/A  . Years of education: N/A   Social History Main Topics  . Smoking status: Current Every Day Smoker    Packs/day: 0.00    Types: Cigarettes  . Smokeless tobacco: Never Used  . Alcohol use Yes  . Drug use: Yes    Types: Marijuana     Comment: Once every 2-3 months,  Last used: Last month  . Sexual activity: No   Other Topics Concern  . None   Social History Narrative  . None   Additional Social History:    Allergies:   Allergies  Allergen Reactions  . Haldol [Haloperidol] Shortness Of Breath    Labs:  Results for orders placed or performed during the hospital encounter of 12/29/16 (from the past 48 hour(s))  Comprehensive metabolic panel     Status: Abnormal   Collection Time: 12/29/16  1:47 AM  Result Value Ref Range   Sodium 140 135 - 145 mmol/L   Potassium 3.9 3.5 - 5.1 mmol/L   Chloride 106 101 - 111 mmol/L   CO2 24 22 - 32 mmol/L   Glucose, Bld 87 65 - 99 mg/dL   BUN 9 6 - 20 mg/dL   Creatinine, Ser 1.09 0.61 - 1.24 mg/dL  Calcium 8.7 (L) 8.9 - 10.3 mg/dL   Total Protein 7.0 6.5 - 8.1 g/dL   Albumin 4.0 3.5 - 5.0 g/dL   AST 22 15 - 41 U/L   ALT 30 17 - 63 U/L   Alkaline Phosphatase 55 38 - 126 U/L   Total Bilirubin 0.4 0.3 - 1.2 mg/dL   GFR calc non Af Amer >60 >60 mL/min   GFR calc Af Amer >60 >60 mL/min    Comment: (NOTE) The eGFR has been calculated using the CKD EPI equation. This calculation has not been validated in all clinical situations. eGFR's persistently <60 mL/min signify possible Chronic Kidney Disease.    Anion gap 10 5 - 15  Ethanol     Status: Abnormal   Collection Time: 12/29/16  1:47 AM  Result Value Ref Range   Alcohol, Ethyl (B) 43 (H) <5 mg/dL    Comment:        LOWEST DETECTABLE LIMIT FOR SERUM ALCOHOL IS 5 mg/dL FOR MEDICAL PURPOSES ONLY   Salicylate level     Status: None   Collection Time: 12/29/16  1:47 AM  Result Value Ref Range   Salicylate Lvl <1.0 2.8 - 30.0 mg/dL  Acetaminophen level     Status: Abnormal   Collection Time: 12/29/16  1:47 AM  Result Value Ref Range   Acetaminophen (Tylenol), Serum <10 (L) 10 - 30 ug/mL    Comment:        THERAPEUTIC CONCENTRATIONS VARY SIGNIFICANTLY. A RANGE OF 10-30 ug/mL MAY BE AN EFFECTIVE CONCENTRATION FOR MANY PATIENTS. HOWEVER, SOME  ARE BEST TREATED AT CONCENTRATIONS OUTSIDE THIS RANGE. ACETAMINOPHEN CONCENTRATIONS >150 ug/mL AT 4 HOURS AFTER INGESTION AND >50 ug/mL AT 12 HOURS AFTER INGESTION ARE OFTEN ASSOCIATED WITH TOXIC REACTIONS.   cbc     Status: Abnormal   Collection Time: 12/29/16  1:47 AM  Result Value Ref Range   WBC 5.5 4.0 - 10.5 K/uL   RBC 4.55 4.22 - 5.81 MIL/uL   Hemoglobin 12.5 (L) 13.0 - 17.0 g/dL   HCT 38.3 (L) 39.0 - 52.0 %   MCV 84.2 78.0 - 100.0 fL   MCH 27.5 26.0 - 34.0 pg   MCHC 32.6 30.0 - 36.0 g/dL   RDW 13.9 11.5 - 15.5 %   Platelets 307 150 - 400 K/uL  Rapid urine drug screen (hospital performed)     Status: Abnormal   Collection Time: 12/29/16  4:18 AM  Result Value Ref Range   Opiates NONE DETECTED NONE DETECTED   Cocaine NONE DETECTED NONE DETECTED   Benzodiazepines NONE DETECTED NONE DETECTED   Amphetamines NONE DETECTED NONE DETECTED   Tetrahydrocannabinol POSITIVE (A) NONE DETECTED   Barbiturates NONE DETECTED NONE DETECTED    Comment:        DRUG SCREEN FOR MEDICAL PURPOSES ONLY.  IF CONFIRMATION IS NEEDED FOR ANY PURPOSE, NOTIFY LAB WITHIN 5 DAYS.        LOWEST DETECTABLE LIMITS FOR URINE DRUG SCREEN Drug Class       Cutoff (ng/mL) Amphetamine      1000 Barbiturate      200 Benzodiazepine   272 Tricyclics       536 Opiates          300 Cocaine          300 THC              50     Current Facility-Administered Medications  Medication Dose Route Frequency Provider Last Rate Last Dose  .  FLUoxetine (PROZAC) capsule 20 mg  20 mg Oral Daily Fredia Sorrow, MD   20 mg at 12/29/16 1621  . risperiDONE (RISPERDAL) tablet 1 mg  1 mg Oral BH-q7a Fredia Sorrow, MD   1 mg at 12/30/16 0810  . risperiDONE (RISPERDAL) tablet 3 mg  3 mg Oral QHS Kristen N Ward, DO   3 mg at 12/29/16 2256   Current Outpatient Prescriptions  Medication Sig Dispense Refill  . risperiDONE (RISPERDAL) 3 MG tablet Take 1 tablet (3 mg total) by mouth at bedtime. (Patient not taking:  Reported on 12/09/2016) 30 tablet 0    Musculoskeletal: Unable to assess: camera  Psychiatric Specialty Exam: Physical Exam  Review of Systems  Psychiatric/Behavioral: Positive for depression and hallucinations (auditory). Negative for memory loss, substance abuse and suicidal ideas. The patient is not nervous/anxious and does not have insomnia.     Blood pressure 110/68, pulse 66, temperature 97.8 F (36.6 C), temperature source Oral, resp. rate 16, SpO2 100 %.There is no height or weight on file to calculate BMI.  General Appearance: Casual  Eye Contact:  Good  Speech:  Clear and Coherent and Normal Rate  Volume:  Normal  Mood:  Depressed  Affect:  Congruent  Thought Process:  Coherent, Goal Directed and Linear  Orientation:  Full (Time, Place, and Person)  Thought Content:  Logical  Suicidal Thoughts:  No  Homicidal Thoughts:  No  Memory:  Immediate;   Good Recent;   Fair Remote;   Fair  Judgement:  Good  Insight:  Good  Psychomotor Activity:  Normal  Concentration:  Concentration: Good and Attention Span: Good  Recall:  Good  Fund of Knowledge:  Good  Language:  Good  Akathisia:  No  Handed:  Right  AIMS (if indicated):     Assets:  Communication Skills Desire for Improvement Financial Resources/Insurance Housing Resilience Social Support  ADL's:  Intact  Cognition:  WNL  Sleep:        Treatment Plan Summary: Discharge Home  Provide outpatient resources for medication management, psychiatry and therapy Pt should follow up with [facility]  Medication recommendations:  Risperdal 41m tablet PO  every morning Risperdal 359mtablet PO  every night at bedtime Prozac 2068mapsule  PO once daily  Disposition: No evidence of imminent risk to self or others at present.   Supportive therapy provided about ongoing stressors. Discussed crisis plan, support from social network, calling 911, coming to the Emergency Department, and calling Suicide Hotline.  LauEthelene HalP 12/30/2016 10:07 AM

## 2016-12-30 NOTE — ED Provider Notes (Signed)
Behavioral health recommending dc from ER. [facility]. Home with prescriptions for prozac and risperdol. Please see consultation note for complete details   Azalia BilisKevin Areona Homer, MD 12/30/16 1227

## 2016-12-30 NOTE — ED Notes (Signed)
Lunch has been ordered  

## 2016-12-30 NOTE — ED Notes (Signed)
Pt is in stable condition upon d/c and ambulates from ED. 

## 2017-01-01 ENCOUNTER — Emergency Department (HOSPITAL_COMMUNITY): Payer: Medicaid Other

## 2017-01-01 ENCOUNTER — Emergency Department (HOSPITAL_COMMUNITY)
Admission: EM | Admit: 2017-01-01 | Discharge: 2017-01-02 | Disposition: A | Discharge status: Home / Self Care | Payer: Medicaid Other | Attending: Emergency Medicine | Admitting: Emergency Medicine

## 2017-01-01 ENCOUNTER — Encounter (HOSPITAL_COMMUNITY): Payer: Self-pay

## 2017-01-01 DIAGNOSIS — Y9367 Activity, basketball: Secondary | ICD-10-CM | POA: Insufficient documentation

## 2017-01-01 DIAGNOSIS — F17210 Nicotine dependence, cigarettes, uncomplicated: Secondary | ICD-10-CM | POA: Insufficient documentation

## 2017-01-01 DIAGNOSIS — M25572 Pain in left ankle and joints of left foot: Secondary | ICD-10-CM

## 2017-01-01 DIAGNOSIS — Y999 Unspecified external cause status: Secondary | ICD-10-CM | POA: Diagnosis not present

## 2017-01-01 DIAGNOSIS — Y929 Unspecified place or not applicable: Secondary | ICD-10-CM | POA: Insufficient documentation

## 2017-01-01 DIAGNOSIS — X509XXA Other and unspecified overexertion or strenuous movements or postures, initial encounter: Secondary | ICD-10-CM | POA: Insufficient documentation

## 2017-01-01 NOTE — ED Triage Notes (Signed)
Pt states that he hurt his L ankle playing basketball and he would like an ankle brace. Pt ambulatory

## 2017-01-02 NOTE — ED Provider Notes (Signed)
[facility]-EMERGENCY DEPT Provider Note   CSN: [id number] Arrival date & time: 01/01/17  2237     History   Chief Complaint Chief Complaint  Patient presents with  . Ankle Pain    HPI Cole Barrett is a 31 y.o. male with a hx of PE, schizophrenia, homelessness presents to the Emergency Department complaining of acute, persistent pain in the left ankle onset around 2 PM today while playing basketball. He reports he stumbled and rolled his ankle. He denies falling or hitting his head. He reports subjective swelling and pain with ambulation. No treatments prior to arrival. No alleviating factors. Patient denies numbness or tingling, low back pain, headache or neck pain.   The history is provided by the patient and medical records. No language interpreter was used.    Past Medical History:  Diagnosis Date  . Homelessness   . Hypercholesterolemia   . Mental disorder   . Paranoid behavior ([facility])   . PE (pulmonary embolism)   . Schizophrenia [facility](HCC)     Patient Active Problem List   Diagnosis Date Noted  . Alcohol abuse   . Auditory hallucination   . Alcohol abuse with alcohol-induced mood disorder ([facility]) 09/08/2016  . Adjustment disorder with depressed mood 09/07/2016  . Homelessness 09/03/2016  . Person feigning illness 09/03/2016  . 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 Cannabis use disorder, mild, abuse 08/29/2016  . Paranoid schizophrenia ([facility]) 08/26/2016  . Pulmonary emboli ([facility]) 07/13/2016  . 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 Acute left flank pain 01/02/2014  . Pulmonary embolism, bilateral ([facility]) 01/02/2014    History reviewed. No pertinent surgical history.     Home Medications    Prior to Admission medications   Medication Sig Start Date End Date Taking? Authorizing Provider  FLUoxetine (PROZAC) 20 MG tablet Take 1 tablet (20 mg total) by mouth daily. 12/30/16   Azalia BilisKevin Campos, MD  risperiDONE (RISPERDAL) 3 MG tablet Take 1 tablet (3 mg  total) by mouth at bedtime. Patient not taking: Reported on 12/09/2016 11/23/16   Marlon Peliffany Greene, PA-C  risperiDONE (RISPERDAL) 3 MG tablet Take 1 tablet (3 mg total) by mouth at bedtime. 12/30/16   Azalia BilisKevin Campos, MD    Family History Family History  Problem Relation Age of Onset  . Hypertension Mother     Social History Social History  Substance Use Topics  . Smoking status: Current Every Day Smoker    Packs/day: 0.00    Types: Cigarettes  . Smokeless tobacco: Never Used  . Alcohol use Yes     Allergies   Haldol [haloperidol]   Review of Systems Review of Systems  Constitutional: Negative for chills and fever.  Gastrointestinal: Negative for nausea and vomiting.  Musculoskeletal: Positive for arthralgias and joint swelling. Negative for back pain, neck pain and neck stiffness.  Skin: Negative for wound.  Neurological: Negative for numbness.  Hematological: Does not bruise/bleed easily.  Psychiatric/Behavioral: The patient is not nervous/anxious.   All other systems reviewed and are negative.    Physical Exam Updated Vital Signs BP 133/73   Pulse 89   Temp 98 F (36.7 C)   Resp 20   Ht 5\' 10"  (1.778 m)   Wt 99.8 kg   SpO2 100%   BMI 31.57 kg/m   Physical Exam  Constitutional: He appears well-developed and well-nourished. No distress.  HENT:  Head: Normocephalic and atraumatic.  Eyes: Conjunctivae are normal.  Neck: Normal range of motion.  Cardiovascular: Normal rate, regular rhythm and intact  distal pulses.   Capillary refill < 3 sec  Pulmonary/Chest: Effort normal and breath sounds normal.  Musculoskeletal: He exhibits tenderness. He exhibits no edema.  Left ankle: Full range of motion with some pain, no obvious swelling, deformity or ecchymosis. Strength 5/5 with flexion and extension. Sensation intact to the entire left lower leg.  Neurological: He is alert. Coordination normal.  Patient ambulates without gait disturbance.  Skin: Skin is warm and dry.  He is not diaphoretic.  No tenting of the skin  Psychiatric: He has a normal mood and affect.  Nursing note and vitals reviewed.    ED Treatments / Results   Radiology Dg Ankle Complete Left  Result Date: 01/01/2017 CLINICAL DATA:  LEFT lateral ankle pain, injury while playing basketball today. EXAM: LEFT ANKLE COMPLETE - 3+ VIEW COMPARISON:  None. FINDINGS: No fracture deformity nor dislocation. The ankle mortise appears congruent and the tibiofibular syndesmosis intact. No destructive bony lesions. Mild lateral ankle soft tissue swelling without subcutaneous gas or radiopaque foreign bodies. IMPRESSION: Soft tissue swelling.  No acute osseous process. Electronically Signed   By: Awilda Metro M.D.   On: 01/01/2017 23:04    Procedures Procedures (including critical care time)  Medications Ordered in ED Medications - No data to display   Initial Impression / Assessment and Plan / ED Course  I have reviewed the triage vital signs and the nursing notes.  Pertinent labs & imaging results that were available during my care of the patient were reviewed by me and considered in my medical decision making (see chart for details).     Patient X-Ray negative for obvious fracture or dislocation. Pain managed in ED. Pt advised to follow up with orthopedics if symptoms persist for possibility of missed fracture diagnosis. Patient given brace while in ED, conservative therapy recommended and discussed. Patient will be dc home & is agreeable with above plan.   Final Clinical Impressions(s) / ED Diagnoses   Final diagnoses:  Acute left ankle pain    New Prescriptions New Prescriptions   No medications on file     Dierdre Forth, PA-C 01/02/17 0011    Shaune Pollack, MD 01/02/17 [phone]

## 2017-01-02 NOTE — Discharge Instructions (Signed)
1. Medications: alternate naprosyn and tylenol for pain control, usual home medications °2. Treatment: rest, ice, elevate and use brace, drink plenty of fluids, gentle stretching °3. Follow Up: Please followup with orthopedics as directed or your PCP in 1 week if no improvement for discussion of your diagnoses and further evaluation after today's visit; if you do not have a primary care doctor use the resource guide provided to find one; Please return to the ER for worsening symptoms or other concerns ° °

## 2017-01-07 ENCOUNTER — Encounter (HOSPITAL_COMMUNITY): Payer: Self-pay | Admitting: Emergency Medicine

## 2017-01-07 ENCOUNTER — Emergency Department (HOSPITAL_COMMUNITY)
Admission: EM | Admit: 2017-01-07 | Discharge: 2017-01-07 | Disposition: A | Discharge status: Home / Self Care | Payer: Medicaid Other | Attending: Emergency Medicine | Admitting: Emergency Medicine

## 2017-01-07 DIAGNOSIS — R440 Auditory hallucinations: Secondary | ICD-10-CM | POA: Diagnosis present

## 2017-01-07 DIAGNOSIS — Z79899 Other long term (current) drug therapy: Secondary | ICD-10-CM | POA: Insufficient documentation

## 2017-01-07 DIAGNOSIS — F17210 Nicotine dependence, cigarettes, uncomplicated: Secondary | ICD-10-CM | POA: Insufficient documentation

## 2017-01-07 DIAGNOSIS — F200 Paranoid schizophrenia: Secondary | ICD-10-CM | POA: Diagnosis present

## 2017-01-07 DIAGNOSIS — F4320 Adjustment disorder, unspecified: Secondary | ICD-10-CM | POA: Diagnosis not present

## 2017-01-07 DIAGNOSIS — Z720 Tobacco use: Secondary | ICD-10-CM

## 2017-01-07 DIAGNOSIS — D649 Anemia, unspecified: Secondary | ICD-10-CM | POA: Insufficient documentation

## 2017-01-07 LAB — COMPREHENSIVE METABOLIC PANEL
ALBUMIN: 3.9 g/dL (ref 3.5–5.0)
ALK PHOS: 49 U/L (ref 38–126)
ALT: 27 U/L (ref 17–63)
AST: 26 U/L (ref 15–41)
Anion gap: 9 (ref 5–15)
BUN: 12 mg/dL (ref 6–20)
CALCIUM: 8.7 mg/dL — AB (ref 8.9–10.3)
CO2: 24 mmol/L (ref 22–32)
Chloride: 104 mmol/L (ref 101–111)
Creatinine, Ser: 1.11 mg/dL (ref 0.61–1.24)
GFR calc non Af Amer: >60 mL/min (ref >60)
GLUCOSE: 100 mg/dL — AB (ref 65–99)
Potassium: 3.8 mmol/L (ref 3.5–5.1)
SODIUM: 137 mmol/L (ref 135–145)
Total Bilirubin: 0.5 mg/dL (ref 0.3–1.2)
Total Protein: 7.1 g/dL (ref 6.5–8.1)

## 2017-01-07 LAB — CBC
HEMATOCRIT: 36.7 % — AB (ref 39.0–52.0)
HEMOGLOBIN: 12.1 g/dL — AB (ref 13.0–17.0)
MCH: 27.3 pg (ref 26.0–34.0)
MCHC: 33 g/dL (ref 30.0–36.0)
MCV: 82.8 fL (ref 78.0–100.0)
Platelets: 274 10*3/uL (ref 150–400)
RBC: 4.43 MIL/uL (ref 4.22–5.81)
RDW: 13.9 % (ref 11.5–15.5)
WBC: 3.9 10*3/uL — ABNORMAL LOW (ref 4.0–10.5)

## 2017-01-07 LAB — ACETAMINOPHEN LEVEL: Acetaminophen (Tylenol), Serum: <10 ug/mL — ABNORMAL LOW (ref 10–30)

## 2017-01-07 LAB — RAPID URINE DRUG SCREEN, HOSP PERFORMED
Amphetamines: NOT DETECTED
BARBITURATES: NOT DETECTED
Benzodiazepines: NOT DETECTED
Cocaine: NOT DETECTED
Opiates: NOT DETECTED
TETRAHYDROCANNABINOL: POSITIVE — AB

## 2017-01-07 LAB — SALICYLATE LEVEL: Salicylate Lvl: <7 mg/dL (ref 2.8–30.0)

## 2017-01-07 LAB — ETHANOL: Alcohol, Ethyl (B): <5 mg/dL (ref <5)

## 2017-01-07 MED ORDER — KETOROLAC TROMETHAMINE 30 MG/ML IJ SOLN: 15.0000 mg | Freq: Once | INTRAMUSCULAR | Status: DC

## 2017-01-07 MED ORDER — LORAZEPAM 1 MG PO TABS: 1.0000 mg | ORAL_TABLET | Freq: Three times a day (TID) | ORAL | Status: DC | PRN

## 2017-01-07 MED ORDER — IBUPROFEN 200 MG PO TABS: 600.0000 mg | ORAL_TABLET | Freq: Three times a day (TID) | ORAL | Status: DC | PRN

## 2017-01-07 MED ORDER — FLUOXETINE HCL 20 MG PO CAPS
20.0000 mg | ORAL_CAPSULE | Freq: Every day | ORAL | Status: DC
  Administered 2017-01-07: 20 mg via ORAL
  Filled 2017-01-07: qty 1

## 2017-01-07 MED ORDER — ZOLPIDEM TARTRATE 5 MG PO TABS: 5.0000 mg | ORAL_TABLET | Freq: Every evening | ORAL | Status: DC | PRN

## 2017-01-07 MED ORDER — ALUM & MAG HYDROXIDE-SIMETH 200-200-20 MG/5ML PO SUSP: 30.0000 mL | ORAL | Status: DC | PRN

## 2017-01-07 MED ORDER — ACETAMINOPHEN 325 MG PO TABS: 650.0000 mg | ORAL_TABLET | ORAL | Status: DC | PRN

## 2017-01-07 MED ORDER — NICOTINE 21 MG/24HR TD PT24
21.0000 mg | MEDICATED_PATCH | Freq: Every day | TRANSDERMAL | Status: DC
  Filled 2017-01-07: qty 1

## 2017-01-07 MED ORDER — ONDANSETRON HCL 4 MG PO TABS: 4.0000 mg | ORAL_TABLET | Freq: Three times a day (TID) | ORAL | Status: DC | PRN

## 2017-01-07 MED ORDER — KETOROLAC TROMETHAMINE 30 MG/ML IJ SOLN: 30.0000 mg | Freq: Once | INTRAMUSCULAR | Status: DC

## 2017-01-07 MED ORDER — RISPERIDONE 2 MG PO TABS: 3.0000 mg | ORAL_TABLET | Freq: Every day | ORAL | Status: DC

## 2017-01-07 NOTE — ED Notes (Signed)
Patient understood discharge instructions.  He is A & O x4.  

## 2017-01-07 NOTE — ED Notes (Signed)
Pt called in waiting room no answer 

## 2017-01-07 NOTE — Consult Note (Signed)
Telepsych Consultation   Reason for Consult:  Auditory hallucinations Referring Physician:  EDP Patient Identification: Cole Barrett MRN:  [id number] Principal Diagnosis: <principal problem not specified>  Diagnosis:   Patient Active Problem List   Diagnosis Date Noted  . Alcohol abuse [F10.10]   . Auditory hallucination [R44.0]   . Alcohol abuse with alcohol-induced mood disorder ([place]) [F10.14] 09/08/2016  . Adjustment disorder with depressed mood [F43.21] 09/07/2016  . Homelessness [Z59.0] 09/03/2016  . Person feigning illness [Z76.5] 09/03/2016  . 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 Cannabis use disorder, mild, abuse [F12.10] 08/29/2016  . Paranoid schizophrenia ([place]) [F20.0] 08/26/2016  . Pulmonary emboli ([place]) [I26.99] 07/13/2016  . Pulmonary embolism and infarction ([place]) [I26.99] 01/02/2014  . Acute left flank pain [R10.9] 01/02/2014  . Pulmonary embolism, bilateral ([place]) [I26.99] 01/02/2014    Total Time spent with patient: 30 minutes  Subjective:   Cole Barrett is a 31 y.o. male patient admitted with auditory hallucinations. Pt seen and chart reviewed with Dr. Louretta Shorten and treatment team this AM. Pt is alert/oriented x4, calm, cooperative, and appropriate to situation. Pt denies suicidal/homicidal ideation and psychosis and does not appear to be responding to internal stimuli. Pt has improved greatly since he arrived. He is no longer presenting with concerns that would warrant inpatient admission.    HPI:  I have reviewed and concur with HPI elements below, modified as follows: "Cole Barrett is an 31 y.o. male presenting voluntarily to [facility] for increasing hallucinations. Patient states that he has been having hallucinations "for about a year" but states "when I woke up this morning they was just louder and I wanted to see a doctor to help me with the voices in my head." Patient states that the voices "say they gonna kill me."  Patient states that at baseline he hears the voices but they were louder earlier today. Patient states that he is prescribed Risperdal but has not taken it in the last two days. Patient denies suicidal ideations and denies intent or plan. Patient denies history of suicidal attempts. Patient denies history of self injurious behaviors. Patient denies feeling depressed and states that he isolates himself from others at times. Patient denies homicidal ideations with no history of aggression. Patient denies access to firearms. Patient denies pending charges and upcoming court dates. Patient denies active probation. Patient denies use of drugs and alcohol. Patient UDS + THC and BAL <5 upon arrival.   Patient is alert and oriented x4. Patient is calm and cooperative during assessment. Patient was alert but drowsy at times during the assessment. Patient answered questions appropriately. Patient states that he lives alone and denies having support. Patient states that he has had one appointment at [facility] for medication management but did not return and does not have a scheduled appointment. Patient denies other outpatient treatment. Patient has been admitted to [facility] for treatment in September of 2017 which he states  Was his last inpatient psychiatric hospitalization. "  Past Psychiatric History: Depression, auditory hallucinations, schizophrenia  Risk to Self: Suicidal Ideation: No Suicidal Intent: No Is patient at risk for suicide?: No Suicidal Plan?: No Access to Means: No How many times?: 0 Other Self Harm Risks: Denies Triggers for Past Attempts: None known Intentional Self Injurious Behavior: None Risk to Others: Homicidal Ideation: No Thoughts of Harm to Others: No Comment - Thoughts of Harm to Others: Denies Current Homicidal Intent: No Current Homicidal Plan: No Access to Homicidal Means: No Identified Victim: Denies History of  harm to others?: No Assessment of Violence: None  Noted Violent Behavior Description: Denies Does patient have access to weapons?: No Criminal Charges Pending?: No Describe Pending Criminal Charges: Denies Does patient have a court date: No Prior Inpatient Therapy: Prior Inpatient Therapy: Yes Prior Therapy Dates: 2016 2017 Prior Therapy Facilty/Provider(s): [facility] Reason for Treatment: Schizophrenia Prior Outpatient Therapy: Prior Outpatient Therapy: No Prior Therapy Dates: went to one appt  Prior Therapy Facilty/Provider(s): [facility] Does patient have an ACCT team?: No Does patient have Intensive In-House Services?  : No Does patient have [facility] services? : No Does patient have P4CC services?: No  Past Medical History:  Past Medical History:  Diagnosis Date  . Homelessness   . Hypercholesterolemia   . Mental disorder   . Paranoid behavior (HCC)   . PE (pulmonary embolism)   . Schizophrenia (HCC)    History reviewed. No pertinent surgical history. Family History:  Family History  Problem Relation Age of Onset  . Hypertension Mother    Family Psychiatric  History: Unknown Social History:  History  Alcohol Use  . Yes     History  Drug Use  . Types: Marijuana    Comment: Once every 2-3 months, Last used: Last month    Social History   Social History  . Marital status: Single    Spouse name: N/A  . Number of children: N/A  . Years of education: N/A   Social History Main Topics  . Smoking status: Current Every Day Smoker    Packs/day: 0.00    Types: Cigarettes  . Smokeless tobacco: Never Used  . Alcohol use Yes  . Drug use: Yes    Types: Marijuana     Comment: Once every 2-3 months, Last used: Last month  . Sexual activity: No   Other Topics Concern  . None   Social History Narrative  . None   Additional Social History:    Allergies:   Allergies  Allergen Reactions  . Haldol [Haloperidol] Shortness Of Breath    Labs:  Results for orders placed or performed during the hospital encounter of  01/07/17 (from the past 48 hour(s))  Comprehensive metabolic panel     Status: Abnormal   Collection Time: 01/07/17  5:44 AM  Result Value Ref Range   Sodium 137 135 - 145 mmol/L   Potassium 3.8 3.5 - 5.1 mmol/L   Chloride 104 101 - 111 mmol/L   CO2 24 22 - 32 mmol/L   Glucose, Bld 100 (H) 65 - 99 mg/dL   BUN 12 6 - 20 mg/dL   Creatinine, Ser 0.67 0.61 - 1.24 mg/dL   Calcium 8.7 (L) 8.9 - 10.3 mg/dL   Total Protein 7.1 6.5 - 8.1 g/dL   Albumin 3.9 3.5 - 5.0 g/dL   AST 26 15 - 41 U/L   ALT 27 17 - 63 U/L   Alkaline Phosphatase 49 38 - 126 U/L   Total Bilirubin 0.5 0.3 - 1.2 mg/dL   GFR calc non Af Amer >60 >60 mL/min   GFR calc Af Amer >60 >60 mL/min    Comment: (NOTE) The eGFR has been calculated using the CKD EPI equation. This calculation has not been validated in all clinical situations. eGFR's persistently <60 mL/min signify possible Chronic Kidney Disease.    Anion gap 9 5 - 15  cbc     Status: Abnormal   Collection Time: 01/07/17  5:44 AM  Result Value Ref Range   WBC 3.9 (L) 4.0 - 10.5  K/uL   RBC 4.43 4.22 - 5.81 MIL/uL   Hemoglobin 12.1 (L) 13.0 - 17.0 g/dL   HCT 36.7 (L) 39.0 - 52.0 %   MCV 82.8 78.0 - 100.0 fL   MCH 27.3 26.0 - 34.0 pg   MCHC 33.0 30.0 - 36.0 g/dL   RDW 13.9 11.5 - 15.5 %   Platelets 274 150 - 400 K/uL  Ethanol     Status: None   Collection Time: 01/07/17  5:45 AM  Result Value Ref Range   Alcohol, Ethyl (B) <5 <5 mg/dL    Comment:        LOWEST DETECTABLE LIMIT FOR SERUM ALCOHOL IS 5 mg/dL FOR MEDICAL PURPOSES ONLY   Salicylate level     Status: None   Collection Time: 01/07/17  5:45 AM  Result Value Ref Range   Salicylate Lvl <6.2 2.8 - 30.0 mg/dL  Acetaminophen level     Status: Abnormal   Collection Time: 01/07/17  5:45 AM  Result Value Ref Range   Acetaminophen (Tylenol), Serum <10 (L) 10 - 30 ug/mL    Comment:        THERAPEUTIC CONCENTRATIONS VARY SIGNIFICANTLY. A RANGE OF 10-30 ug/mL MAY BE AN EFFECTIVE CONCENTRATION FOR  MANY PATIENTS. HOWEVER, SOME ARE BEST TREATED AT CONCENTRATIONS OUTSIDE THIS RANGE. ACETAMINOPHEN CONCENTRATIONS >150 ug/mL AT 4 HOURS AFTER INGESTION AND >50 ug/mL AT 12 HOURS AFTER INGESTION ARE OFTEN ASSOCIATED WITH TOXIC REACTIONS.   Rapid urine drug screen (hospital performed)     Status: Abnormal   Collection Time: 01/07/17  8:11 AM  Result Value Ref Range   Opiates NONE DETECTED NONE DETECTED   Cocaine NONE DETECTED NONE DETECTED   Benzodiazepines NONE DETECTED NONE DETECTED   Amphetamines NONE DETECTED NONE DETECTED   Tetrahydrocannabinol POSITIVE (A) NONE DETECTED   Barbiturates NONE DETECTED NONE DETECTED    Comment:        DRUG SCREEN FOR MEDICAL PURPOSES ONLY.  IF CONFIRMATION IS NEEDED FOR ANY PURPOSE, NOTIFY LAB WITHIN 5 DAYS.        LOWEST DETECTABLE LIMITS FOR URINE DRUG SCREEN Drug Class       Cutoff (ng/mL) Amphetamine      1000 Barbiturate      200 Benzodiazepine   952 Tricyclics       841 Opiates          300 Cocaine          300 THC              50     Current Facility-Administered Medications  Medication Dose Route Frequency Provider Last Rate Last Dose  . acetaminophen (TYLENOL) tablet 650 mg  650 mg Oral Q4H PRN [address], PA-C      . alum & mag hydroxide-simeth (MAALOX/MYLANTA) 200-200-20 MG/5ML suspension 30 mL  30 mL Oral PRN [address], PA-C      . FLUoxetine (PROZAC) capsule 20 mg  20 mg Oral Daily [address], PA-C   20 mg at 01/07/17 1009  . ibuprofen (ADVIL,MOTRIN) tablet 600 mg  600 mg Oral Q8H PRN [address], [employer]      . LORazepam (ATIVAN) tablet 1 mg  1 mg Oral Q8H PRN [address] PA-C      . nicotine (NICODERM CQ - dosed in mg/24 hours) patch 21 mg  21 mg Transdermal Daily [address] PA-C      . ondansetron (ZOFRAN) tablet 4 mg  4 mg Oral Q8H PRN [address], [employer]      .  risperiDONE (RISPERDAL) tablet 3 mg  3 mg Oral QHS [address], PA-C      . zolpidem (AMBIEN) tablet 5 mg  5 mg Oral QHS PRN  [employer], PA-C       Current Outpatient Prescriptions  Medication Sig Dispense Refill  . FLUoxetine (PROZAC) 20 MG tablet Take 1 tablet (20 mg total) by mouth daily. 30 tablet 0  . risperiDONE (RISPERDAL) 3 MG tablet Take 1 tablet (3 mg total) by mouth at bedtime. 30 tablet 0  . risperiDONE (RISPERDAL) 3 MG tablet Take 1 tablet (3 mg total) by mouth at bedtime. (Patient not taking: Reported on 12/09/2016) 30 tablet 0    Musculoskeletal: Unable to assess: camera  Psychiatric Specialty Exam: Physical Exam  Review of Systems  Psychiatric/Behavioral: Positive for depression and hallucinations (auditory). Negative for memory loss, substance abuse and suicidal ideas. The patient is not nervous/anxious and does not have insomnia.     Blood pressure 121/73, pulse 63, temperature 98.3 F (36.8 C), temperature source Oral, resp. rate 14, SpO2 99 %.There is no height or weight on file to calculate BMI.  General Appearance: Casual and Fairly Groomed  Eye Contact:  Good  Speech:  Clear and Coherent and Normal Rate  Volume:  Normal  Mood:  Depressed  Affect:  Congruent  Thought Process:  Coherent, Goal Directed and Linear  Orientation:  Full (Time, Place, and Person)  Thought Content: focused on discharge plans   Suicidal Thoughts:  No and able to contract for safety  Homicidal Thoughts:  No  Memory:  Immediate;   Good Recent;   Fair Remote;   Fair  Judgement:  Good  Insight:  Good  Psychomotor Activity:  Normal  Concentration:  Concentration: Good and Attention Span: Good  Recall:  Good  Fund of Knowledge:  Good  Language:  Good  Akathisia:  No  Handed:  Right  AIMS (if indicated):     Assets:  Communication Skills Desire for Improvement Financial Resources/Insurance Housing Resilience Social Support  ADL's:  Intact  Cognition:  WNL  Sleep:       Treatment Plan Summary: Paranoid schizophrenia ([place]) stable for outpatient management, treated as below:  Risperdal '3mg'$   tablet PO  every night at bedtime Prozac '20mg'$  capsule  PO once daily  Disposition: No evidence of imminent risk to self or others at present.   Supportive therapy provided about ongoing stressors. Discussed crisis plan, support from social network, calling 911, coming to the Emergency Department, and calling Suicide Hotline.  Benjamine Mola, [place] 01/07/2017 5:22 PM  Reviewed the information documented and agree with the treatment plan.  [facility] 01/12/2017 6:57 PM

## 2017-01-07 NOTE — ED Triage Notes (Signed)
Pt c/o hearing voices; this began this morning when pt woke up; pt states he's been taking his medication as prescribed; pt states the voices are saying they're going to kill him; pt unable to tell writer what month or season it is

## 2017-01-07 NOTE — ED Provider Notes (Signed)
[facility]EMER[facility]ENCY DEPT Provider Note   CSN: [id number] Arrival date & time: 01/07/17  [id number]     History   Chief Complaint Chief Complaint  Patient presents with  . Hallucinations    HPI Cole Barrett is a 31 y.o. male with a PMHx of homelessness, schizophrenia, alcoholism, PE, and HLD, who presents to the ED with complaints of auditory hallucinations that began 2 days ago. Patient states that he heard voices telling him that they're going to kill him, however he denies SI/HI. Denies visual hallucinations. States he hasn't taken his psychiatric medications in 2 days, states he cannot recall the names and doses of any of his medications but states that it is 2 different medications. Per chart review, patient is prescribed Prozac and Risperdal; once these are mentioned to him, he acknowledges that those are the meds he takes. +Smoker. Goes to [facility], last went last week. Denies EtOH or illicit drug use. He also denies any other medical complaints. Here voluntarily.    The history is provided by the patient and medical records. No language interpreter was used.  Mental Health Problem  Presenting symptoms: hallucinations   Presenting symptoms: no homicidal ideas and no suicidal thoughts   Onset quality:  Sudden Duration:  2 days Timing:  Constant Progression:  Unchanged Chronicity:  Recurrent Context: noncompliance   Treatment compliance:  Some of the time Time since last psychoactive medication taken:  2 days Relieved by:  None tried Worsened by:  Nothing Ineffective treatments:  None tried Associated symptoms: no abdominal pain and no chest pain   Risk factors: hx of mental illness     Past Medical History:  Diagnosis Date  . Homelessness   . Hypercholesterolemia   . Mental disorder   . Paranoid behavior (HCC)   . PE (pulmonary embolism)   . Schizophrenia [facility](HCC)     Patient Active Problem List   Diagnosis Date Noted  . Alcohol abuse   . Auditory hallucination     . Alcohol abuse with alcohol-induced mood disorder (HCC) 09/08/2016  . Adjustment disorder with depressed mood 09/07/2016  . Homelessness 09/03/2016  . Person feigning illness 09/03/2016  . 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 Cannabis use disorder, mild, abuse 08/29/2016  . Paranoid schizophrenia (HCC) 08/26/2016  . Pulmonary emboli (HCC) 07/13/2016  . 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 Acute left flank pain 01/02/2014  . Pulmonary embolism, bilateral (HCC) 01/02/2014    History reviewed. No pertinent surgical history.     Home Medications    Prior to Admission medications   Medication Sig Start Date End Date Taking? Authorizing Provider  FLUoxetine (PROZAC) 20 MG tablet Take 1 tablet (20 mg total) by mouth daily. 12/30/16  Yes Azalia BilisKevin Campos, MD  risperiDONE (RISPERDAL) 3 MG tablet Take 1 tablet (3 mg total) by mouth at bedtime. 12/30/16  Yes Azalia BilisKevin Campos, MD  risperiDONE (RISPERDAL) 3 MG tablet Take 1 tablet (3 mg total) by mouth at bedtime. Patient not taking: Reported on 12/09/2016 11/23/16   Marlon Peliffany Greene, PA-C    Family History Family History  Problem Relation Age of Onset  . Hypertension Mother     Social History Social History  Substance Use Topics  . Smoking status: Current Every Day Smoker    Packs/day: 0.00    Types: Cigarettes  . Smokeless tobacco: Never Used  . Alcohol use Yes     Allergies   Haldol [haloperidol]   Review of Systems Review of Systems  Constitutional: Negative for chills  and fever.  Respiratory: Negative for shortness of breath.   Cardiovascular: Negative for chest pain.  Gastrointestinal: Negative for abdominal pain, constipation, diarrhea, nausea and vomiting.  Genitourinary: Negative for dysuria and hematuria.  Musculoskeletal: Negative for arthralgias and myalgias.  Skin: Negative for color change.  Allergic/Immunologic: Negative for immunocompromised state.  Neurological: Negative for weakness and numbness.   Psychiatric/Behavioral: Positive for hallucinations. Negative for confusion, homicidal ideas and suicidal ideas.   10 Systems reviewed and are negative for acute change except as noted in the HPI.   Physical Exam Updated Vital Signs BP 129/66   Pulse 99   Temp 98.3 F (36.8 C) (Oral)   Resp 17   SpO2 99%   Physical Exam  Constitutional: He is oriented to person, place, and time. Vital signs are normal. He appears well-developed and well-nourished.  Non-toxic appearance. No distress.  Afebrile, nontoxic, NAD  HENT:  Head: Normocephalic and atraumatic.  Mouth/Throat: Oropharynx is clear and moist and mucous membranes are normal.  Eyes: Conjunctivae and EOM are normal. Right eye exhibits no discharge. Left eye exhibits no discharge.  Neck: Normal range of motion. Neck supple.  Cardiovascular: Normal rate, regular rhythm, normal heart sounds and intact distal pulses.  Exam reveals no gallop and no friction rub.   No murmur heard. Pulmonary/Chest: Effort normal and breath sounds normal. No respiratory distress. He has no decreased breath sounds. He has no wheezes. He has no rhonchi. He has no rales.  Abdominal: Soft. Normal appearance and bowel sounds are normal. He exhibits no distension. There is no tenderness. There is no rigidity, no rebound, no guarding, no CVA tenderness, no tenderness at McBurney's point and negative Murphy's sign.  Musculoskeletal: Normal range of motion.  Neurological: He is alert and oriented to person, place, and time. He has normal strength. No sensory deficit.  Skin: Skin is warm, dry and intact. No rash noted.  Psychiatric: His affect is blunt. He is actively hallucinating. He expresses no homicidal and no suicidal ideation. He expresses no suicidal plans and no homicidal plans.  Flat affect, endorsing AH hearing voices telling him they're going to kill him; denies VH, SI, HI  Nursing note and vitals reviewed.    ED Treatments / Results  Labs (all labs  ordered are listed, but only abnormal results are displayed) Labs Reviewed  COMPREHENSIVE METABOLIC PANEL - Abnormal; Notable for the following:       Result Value   Glucose, Bld 100 (*)    Calcium 8.7 (*)    All other components within normal limits  ACETAMINOPHEN LEVEL - Abnormal; Notable for the following:    Acetaminophen (Tylenol), Serum <10 (*)    All other components within normal limits  CBC - Abnormal; Notable for the following:    WBC 3.9 (*)    Hemoglobin 12.1 (*)    HCT 36.7 (*)    All other components within normal limits  ETHANOL  SALICYLATE LEVEL  RAPID URINE DRUG SCREEN, HOSP PERFORMED    EKG  EKG Interpretation None       Radiology No results found.  Procedures Procedures (including critical care time)  Medications Ordered in ED Medications  alum & mag hydroxide-simeth (MAALOX/MYLANTA) 200-200-20 MG/5ML suspension 30 mL (not administered)  ondansetron (ZOFRAN) tablet 4 mg (not administered)  nicotine (NICODERM CQ - dosed in mg/24 hours) patch 21 mg (not administered)  zolpidem (AMBIEN) tablet 5 mg (not administered)  ibuprofen (ADVIL,MOTRIN) tablet 600 mg (not administered)  acetaminophen (TYLENOL) tablet 650 mg (not  administered)  LORazepam (ATIVAN) tablet 1 mg (not administered)  FLUoxetine (PROZAC) tablet 20 mg (not administered)  risperiDONE (RISPERDAL) tablet 3 mg (not administered)     Initial Impression / Assessment and Plan / ED Course  I have reviewed the triage vital signs and the nursing notes.  Pertinent labs & imaging results that were available during my care of the patient were reviewed by me and considered in my medical decision making (see chart for details).     31 y.o. male here with AH telling him they're going to kill him, denies SI/HI/VH, drug use, or EtOH use. Noncompliant with meds. No other complaints at this time. CBC unremarkable aside from chronic anemia. Remainder of work up pending. Will reassess after labs.  7:29  AM EtOH undetectable. CMP WNL. Salicylate and acetaminophen levels WNL. UDS pending, but does not interfere with med clearance, pt medically cleared at this time. Psych hold orders and home med orders placed. Please see TTS notes for further documentation of care/dispo. PLEASE NOTE THAT PT IS HERE VOLUNTARILY AT THIS TIME, IF PT TRIES TO LEAVE THEY WOULD NEED IVC PAPERWORK TAKEN OUT. Pt stable at time of med clearance.    Final Clinical Impressions(s) / ED Diagnoses   Final diagnoses:  Auditory hallucination  Tobacco user  Chronic anemia    New Prescriptions New Prescriptions   No medications on file     [address], PA-C 01/07/17 [id number]    April Palumbo, MD 01/07/17 [id number]

## 2017-01-07 NOTE — BH Assessment (Addendum)
Assessment Note  Cole Barrett is an 31 y.o. male presenting voluntarily to [facility] for increasing hallucinations. Patient states that he has been having hallucinations "for about a year" but states "when I woke up this morning they was just louder and I wanted to see a doctor to help me with the voices in my head." Patient states that the voices "say they gonna kill me." Patient states that at baseline he hears the voices but they were louder earlier today. Patient states that he is prescribed Risperdal but has not taken it in the last two days. Patient denies suicidal ideations and denies intent or plan. Patient denies history of suicidal attempts. Patient denies history of self injurious behaviors. Patient denies feeling depressed and states that he isolates himself from others at times. Patient denies homicidal ideations with no history of aggression. Patient denies access to firearms. Patient denies pending charges and upcoming court dates. Patient denies active probation. Patient denies use of drugs and alcohol. Patient UDS + THC and BAL <5 upon arrival.   Patient is alert and oriented x4. Patient is calm and cooperative during assessment. Patient was alert but drowsy at times during the assessment. Patient answered questions appropriately. Patient states that he lives alone and denies having support. Patient states that he has [facility] one [facility] but did not return and does not have a scheduled appointment. Patient denies other outpatient treatment. [facility] September of 2017 which he states  Was his last inpatient psychiatric hospitalization.   Consulted with Dr. Elsie SaasJonnalagadda who recommends discharging patient at this time.   Diagnosis: Adjustment Disorder, Unspecified   Past Medical History:  Past Medical History:  Diagnosis Date  . Homelessness   . Hypercholesterolemia   . Mental disorder   .  Paranoid behavior (HCC)   . PE (pulmonary embolism)   . Schizophrenia (HCC)     History reviewed. No pertinent surgical history.  Family History:  Family History  Problem Relation Age of Onset  . Hypertension Mother     Social History:  reports that he has been smoking Cigarettes.  He has been smoking about 0.00 packs per day. He has never used smokeless tobacco. He reports that he drinks alcohol. He reports that he uses drugs, including Marijuana.  Additional Social History:  Alcohol / Drug Use Pain Medications: Denies Prescriptions: Denies Over the Counter: Denies Substance #1 Name of Substance 1: Denies Substance #2 Name of Substance 2: Denies  CIWA: CIWA-Ar BP: 136/69 Pulse Rate: 64 COWS:    Allergies:  Allergies  Allergen Reactions  . Haldol [Haloperidol] Shortness Of Breath    Home Medications:  (Not in a hospital admission)  OB/GYN Status:  No LMP for male patient.  General Assessment Data [facility] of Assessment: [facility] TTS Assessment: In system Is this a Tele or Face-to-Face Assessment?: Face-to-Face Is this an Initial Assessment or a Re-assessment for this encounter?: Initial Assessment Marital status: Single Is patient pregnant?: No Pregnancy Status: No Living Arrangements: Alone Can pt return to current living arrangement?: Yes Admission Status: Voluntary Is patient capable of signing voluntary admission?: Yes Referral Source: Self/Family/Friend     Crisis Care Plan Living Arrangements: Alone Name of Psychiatrist: None Name of Therapist: None  Education Status Is patient currently in school?: No Highest grade of school patient has completed: 12th  Risk to self with the past 6 months Suicidal Ideation: No Has patient been a risk to self  within the past 6 months prior to admission? : No Suicidal Intent: No Has patient had any suicidal intent within the past 6 months prior to admission? : No Is patient at risk for suicide?: No Suicidal  Plan?: No Has patient had any suicidal plan within the past 6 months prior to admission? : No Access to Means: No Previous Attempts/Gestures: No How many times?: 0 Other Self Harm Risks: Denies Triggers for Past Attempts: None known Intentional Self Injurious Behavior: None Family Suicide History: No Recent stressful life event(s): Other (Comment) (denies) Persecutory voices/beliefs?: No Depression: No Depression Symptoms: Isolating Substance abuse history and/or treatment for substance abuse?: Yes Suicide prevention information given to non-admitted patients: Not applicable  Risk to Others within the past 6 months Homicidal Ideation: No Does patient have any lifetime risk of violence toward others beyond the six months prior to admission? : No Thoughts of Harm to Others: No Comment - Thoughts of Harm to Others: Denies Current Homicidal Intent: No Current Homicidal Plan: No Access to Homicidal Means: No Identified Victim: Denies History of harm to others?: No Assessment of Violence: None Noted Violent Behavior Description: Denies Does patient have access to weapons?: No Criminal Charges Pending?: No Describe Pending Criminal Charges: Denies Does patient have a court date: No Is patient on probation?: No  Psychosis Hallucinations: Auditory ("saying they was gonna kill me") Delusions: None noted  Mental Status Report Appearance/Hygiene: Body odor, In scrubs Eye Contact: Fair Motor Activity: Unable to assess Speech: Logical/coherent Level of Consciousness: Alert, Drowsy Mood: Pleasant Affect: Appropriate to circumstance Anxiety Level: None Thought Processes: Coherent, Relevant Judgement: Partial Orientation: Person, Place, Time, Situation, Appropriate for developmental age Obsessive Compulsive Thoughts/Behaviors: None  Cognitive Functioning Concentration: Normal Memory: Recent Intact, Remote Intact IQ: Average Insight: Poor Impulse Control: Fair Appetite:  Good Sleep: No Change Total Hours of Sleep: 8  ADLScreening [facility] Assessment Services) Patient's cognitive ability adequate to safely complete daily activities?: Yes Patient able to express need for assistance with ADLs?: Yes Independently performs ADLs?: Yes (appropriate for developmental age)  Prior Inpatient Therapy Prior Inpatient Therapy: Yes Prior Therapy Dates: 2016 2017 Prior Therapy Facilty/Provider(s): [facility] Reason for Treatment: Schizophrenia  Prior Outpatient Therapy Prior Outpatient Therapy: No Prior Therapy Dates: went to one appt  Prior Therapy Facilty/Provider(s): [facility] Does patient have an ACCT team?: No Does patient have Intensive In-House Services?  : No Does patient have [facility] services? : No Does patient have P4CC services?: No  ADL Screening (condition at time of admission) Patient's cognitive ability adequate to safely complete daily activities?: Yes Is the patient deaf or have difficulty hearing?: No Does the patient have difficulty seeing, even when wearing glasses/contacts?: No Does the patient have difficulty concentrating, remembering, or making decisions?: No Patient able to express need for assistance with ADLs?: Yes Does the patient have difficulty dressing or bathing?: No Independently performs ADLs?: Yes (appropriate for developmental age) Does the patient have difficulty walking or climbing stairs?: No Weakness of Legs: None Weakness of Arms/Hands: None  Home Assistive Devices/Equipment Home Assistive Devices/Equipment: None    Abuse/Neglect Assessment (Assessment to be complete while patient is alone) Physical Abuse: Denies Verbal Abuse: Denies Sexual Abuse: Denies Exploitation of patient/patient's resources: Denies Self-Neglect: Denies Values / Beliefs Cultural Requests During Hospitalization: None Spiritual Requests During Hospitalization: None   Advance Directives (For Healthcare) Does Patient Have a Medical Advance Directive?:  No Would patient like information on creating a medical advance directive?: No - Patient declined    Additional Information 1:1 In  Past 12 Months?: Yes CIRT Risk: No Elopement Risk: No Does patient have medical clearance?: No     Disposition:  Disposition Initial Assessment Completed for this Encounter: Yes Disposition of Patient: Other dispositions (discharge per Dr. Shela Commons) Other disposition(s): Other (Comment)  On Site Evaluation by:   Reviewed with Physician:    Guiliana Shor 01/07/2017 2:06 PM

## 2017-01-15 NOTE — [facility] Suicide Risk Assessment (Addendum)
  [facility]   Principal Problem: Paranoid schizophrenia [facility](HCC) Discharge Diagnoses:  Patient Active Problem List   Diagnosis Date Noted  . Paranoid schizophrenia (HCC) [F20.0] 08/26/2016    Priority: High  . Alcohol abuse [F10.10]   . Auditory hallucination [R44.0]   . Alcohol abuse with alcohol-induced mood disorder (HCC) [F10.14] 09/08/2016  . Adjustment disorder with depressed mood [F43.21] 09/07/2016  . Homelessness [Z59.0] 09/03/2016  . Person feigning illness [Z76.5] 09/03/2016  . 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 Cannabis use disorder, mild, abuse [F12.10] 08/29/2016  . Pulmonary emboli (HCC) [I26.99] 07/13/2016  . Pulmonary embolism and infarction (HCC) [I26.99] 01/02/2014  . Acute left flank pain [R10.9] 01/02/2014  . Pulmonary embolism, bilateral (HCC) [I26.99] 01/02/2014    Total Time spent with patient: 30 minutes  Musculoskeletal: Strength & Muscle Tone: within normal limits Gait & Station: normal Patient leans: N/A  Psychiatric Specialty Exam:   Blood pressure 120/72, pulse 65, temperature 98.3 F (36.8 C), temperature source Oral, resp. rate 16, SpO2 99 %.There is no height or weight on file to calculate BMI.   General Appearance: Casual and Fairly Groomed  Eye Contact:  Good  Speech:  Clear and Coherent and Normal Rate  Volume:  Normal  Mood:  Depressed  Affect:  Congruent  Thought Process:  Coherent, Goal Directed and Linear  Orientation:  Full (Time, Place, and Person)  Thought Content: focused on discharge plans   Suicidal Thoughts:  No and able to contract for safety  Homicidal Thoughts:  No  Memory:  Immediate;   Good Recent;   Fair Remote;   Fair  Judgement:  Good  Insight:  Good  Psychomotor Activity:  Normal  Concentration:  Concentration: Good and Attention Span: Good  Recall:  Good  Fund of Knowledge:  Good  Language:  Good  Akathisia:  No  Handed:  Right  AIMS (if indicated):     Assets:   Communication Skills Desire for Improvement Financial Resources/Insurance Housing Resilience Social Support  ADL's:  Intact  Cognition:  WNL  Sleep:        Mental Status Per Nursing Assessment::   On Admission:     Demographic Factors:  Male and Adolescent or young adult  Loss Factors: Financial problems/change in socioeconomic status  Historical Factors: Impulsivity  Risk Reduction Factors:   Positive social support, Positive therapeutic relationship and Positive coping skills or problem solving skills  Continued Clinical Symptoms:  Depression:   Impulsivity  Cognitive Features That Contribute To Risk:  Closed-mindedness    Suicide Risk:  Minimal: No identifiable suicidal ideation.  Patients presenting with no risk factors but with morbid ruminations; may be classified as minimal risk based on the severity of the depressive symptoms    Plan Of Care/Follow-up recommendations:  Activity:  As tolerated Diet:  Heart healthy with low sodium.  Beau FannyWithrow, Brecken Dewoody C, FNP 01/07/2017, 1:05 PM

## 2017-01-19 ENCOUNTER — Encounter (HOSPITAL_COMMUNITY): Payer: Self-pay

## 2017-01-19 ENCOUNTER — Emergency Department (HOSPITAL_COMMUNITY)
Admission: EM | Admit: 2017-01-19 | Discharge: 2017-01-19 | Disposition: A | Discharge status: Home / Self Care | Payer: Medicaid Other | Attending: Emergency Medicine | Admitting: Emergency Medicine

## 2017-01-19 DIAGNOSIS — F1010 Alcohol abuse, uncomplicated: Secondary | ICD-10-CM

## 2017-01-19 DIAGNOSIS — R440 Auditory hallucinations: Secondary | ICD-10-CM | POA: Diagnosis present

## 2017-01-19 DIAGNOSIS — Z888 Allergy status to other drugs, medicaments and biological substances status: Secondary | ICD-10-CM | POA: Diagnosis not present

## 2017-01-19 DIAGNOSIS — F17210 Nicotine dependence, cigarettes, uncomplicated: Secondary | ICD-10-CM | POA: Diagnosis not present

## 2017-01-19 DIAGNOSIS — F1210 Cannabis abuse, uncomplicated: Secondary | ICD-10-CM | POA: Diagnosis not present

## 2017-01-19 DIAGNOSIS — F1290 Cannabis use, unspecified, uncomplicated: Secondary | ICD-10-CM | POA: Diagnosis not present

## 2017-01-19 DIAGNOSIS — Z79899 Other long term (current) drug therapy: Secondary | ICD-10-CM | POA: Diagnosis not present

## 2017-01-19 LAB — COMPREHENSIVE METABOLIC PANEL
ALBUMIN: 4 g/dL (ref 3.5–5.0)
ALT: 31 U/L (ref 17–63)
ANION GAP: 13 (ref 5–15)
AST: 27 U/L (ref 15–41)
Alkaline Phosphatase: 54 U/L (ref 38–126)
BILIRUBIN TOTAL: 0.4 mg/dL (ref 0.3–1.2)
BUN: 6 mg/dL (ref 6–20)
CO2: 23 mmol/L (ref 22–32)
Calcium: 8.7 mg/dL — ABNORMAL LOW (ref 8.9–10.3)
Chloride: 103 mmol/L (ref 101–111)
Creatinine, Ser: 0.97 mg/dL (ref 0.61–1.24)
GFR calc Af Amer: >60 mL/min (ref >60)
GFR calc non Af Amer: >60 mL/min (ref >60)
GLUCOSE: 93 mg/dL (ref 65–99)
POTASSIUM: 3.5 mmol/L (ref 3.5–5.1)
Sodium: 139 mmol/L (ref 135–145)
TOTAL PROTEIN: 6.9 g/dL (ref 6.5–8.1)

## 2017-01-19 LAB — RAPID URINE DRUG SCREEN, HOSP PERFORMED
Amphetamines: NOT DETECTED
BENZODIAZEPINES: NOT DETECTED
Barbiturates: NOT DETECTED
COCAINE: NOT DETECTED
OPIATES: NOT DETECTED
Tetrahydrocannabinol: POSITIVE — AB

## 2017-01-19 LAB — CBC
HEMATOCRIT: 38.4 % — AB (ref 39.0–52.0)
Hemoglobin: 12.6 g/dL — ABNORMAL LOW (ref 13.0–17.0)
MCH: 27.5 pg (ref 26.0–34.0)
MCHC: 32.8 g/dL (ref 30.0–36.0)
MCV: 83.7 fL (ref 78.0–100.0)
PLATELETS: 283 10*3/uL (ref 150–400)
RBC: 4.59 MIL/uL (ref 4.22–5.81)
RDW: 13.7 % (ref 11.5–15.5)
WBC: 4.2 10*3/uL (ref 4.0–10.5)

## 2017-01-19 LAB — SALICYLATE LEVEL

## 2017-01-19 LAB — ACETAMINOPHEN LEVEL

## 2017-01-19 LAB — ETHANOL: ALCOHOL ETHYL (B): 94 mg/dL — AB (ref <5)

## 2017-01-19 MED ORDER — ACETAMINOPHEN 325 MG PO TABS: 650.0000 mg | ORAL_TABLET | ORAL | Status: DC | PRN

## 2017-01-19 MED ORDER — THIAMINE HCL 100 MG/ML IJ SOLN: 100.0000 mg | Freq: Every day | INTRAMUSCULAR | Status: DC

## 2017-01-19 MED ORDER — ZOLPIDEM TARTRATE 5 MG PO TABS: 5.0000 mg | ORAL_TABLET | Freq: Every evening | ORAL | Status: DC | PRN

## 2017-01-19 MED ORDER — ONDANSETRON HCL 4 MG PO TABS: 4.0000 mg | ORAL_TABLET | Freq: Three times a day (TID) | ORAL | Status: DC | PRN

## 2017-01-19 MED ORDER — ALUM & MAG HYDROXIDE-SIMETH 200-200-20 MG/5ML PO SUSP: 30.0000 mL | ORAL | Status: DC | PRN

## 2017-01-19 MED ORDER — IBUPROFEN 400 MG PO TABS: 600.0000 mg | ORAL_TABLET | Freq: Three times a day (TID) | ORAL | Status: DC | PRN

## 2017-01-19 MED ORDER — LORAZEPAM 1 MG PO TABS: 0.0000 mg | ORAL_TABLET | Freq: Four times a day (QID) | ORAL | Status: DC

## 2017-01-19 MED ORDER — LORAZEPAM 1 MG PO TABS: 0.0000 mg | ORAL_TABLET | Freq: Two times a day (BID) | ORAL | Status: DC

## 2017-01-19 MED ORDER — VITAMIN B-1 100 MG PO TABS
100.0000 mg | ORAL_TABLET | Freq: Every day | ORAL | Status: DC
  Administered 2017-01-19: 100 mg via ORAL
  Filled 2017-01-19: qty 1

## 2017-01-19 MED ORDER — FLUOXETINE HCL 20 MG PO TABS
20.0000 mg | ORAL_TABLET | Freq: Every day | ORAL | Status: DC
  Administered 2017-01-19: 20 mg via ORAL
  Filled 2017-01-19: qty 1

## 2017-01-19 MED ORDER — NICOTINE 21 MG/24HR TD PT24
21.0000 mg | MEDICATED_PATCH | Freq: Every day | TRANSDERMAL | Status: DC
  Filled 2017-01-19: qty 1

## 2017-01-19 MED ORDER — RISPERIDONE 3 MG PO TABS: 3.0000 mg | ORAL_TABLET | Freq: Every day | ORAL | Status: DC

## 2017-01-19 NOTE — ED Notes (Signed)
TTS being completed. 

## 2017-01-19 NOTE — ED Triage Notes (Signed)
Pt endorses hearing voices saying "that they're going to kill me" Pt denies SI/HI, alcohol abuse or drug use. Pt alert and oriented x4. Pt reports that he is not taking his meds. VSS

## 2017-01-19 NOTE — ED Notes (Signed)
Pt initially states [facility]. Pt reminded of [facility] where he had a BH assessment and d/c with medication. Pt denies receiving any medication and has not taken any medication.  Pt confirms AH, pt reports "they are going to kill me". Pt denies homelessness at this time. Pt has very poor hygiene, appears restless, sighing loudly and fidgeting during assessment.  Pt denies SI/HI Pt denies etoh or drug usage recently.  Pt A & O with steady gait

## 2017-01-19 NOTE — ED Provider Notes (Signed)
Spoke with provider from [facility] who felt that patient could be discharged home with [name]. He  was restarted on his medications overnight, and feeling improved with less auditory hallucinations. He denies any suicidal or homicidal thoughts or hearing voices that are threatening.  Vital signs and ED course reviewed. Is felt stable for outpatient regimen.   Cole Guiseana Duo Zyon Grout, MD 01/19/17 1048

## 2017-01-19 NOTE — ED Notes (Signed)
Pt in RR changing into clothes. Bus pass given by previous nurse. Pt A&OX4.

## 2017-01-19 NOTE — Discharge Instructions (Signed)
[facility] follow[facility]  Return for worsening symptoms, including thoughts of hurting yourself, thoughts of hurting others, confusion, or any other symptoms concerning to

## 2017-01-19 NOTE — ED Notes (Signed)
Patient was given a snack and drink. No Diet was ordered, because patient being Discharged.

## 2017-01-19 NOTE — ED Notes (Signed)
Patient states he wishes to shower around 11am. Patient informed after he showers he will change into purple scrubs.

## 2017-01-19 NOTE — ED Notes (Signed)
TTS in progress 

## 2017-01-19 NOTE — ED Notes (Signed)
Patient belongings returned to patient and items from security. Patient given bus pass.

## 2017-01-19 NOTE — Consult Note (Signed)
Telepsych Consultation   Reason for Consult:  Auditory Hallucinations Referring Physician:  EDP Patient Identification: Cole Barrett MRN:  [id number] Principal Diagnosis: Auditory hallucinations  Diagnosis:   Patient Active Problem List   Diagnosis Date Noted  . Auditory hallucinations [R44.0]     Priority: Medium  . Alcohol abuse [F10.10]   . Alcohol abuse with alcohol-induced mood disorder ([place]) [F10.14] 09/08/2016  . Adjustment disorder with depressed mood [F43.21] 09/07/2016  . Homelessness [Z59.0] 09/03/2016  . Person feigning illness [Z76.5] 09/03/2016  . 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 Cannabis use disorder, mild, abuse [F12.10] 08/29/2016  . Paranoid schizophrenia ([place]) [F20.0] 08/26/2016  . Pulmonary emboli ([place]) [I26.99] 07/13/2016  . Pulmonary embolism and infarction ([place]) [I26.99] 01/02/2014  . Acute left flank pain [R10.9] 01/02/2014  . Pulmonary embolism, bilateral ([place]) [I26.99] 01/02/2014    Total Time spent with patient: 15 minutes  Subjective:   Cole Barrett is a 31 y.o. male patient admitted with auditory hallucinations.  HPI:  Per tele assessment note on chart written by Faylene Kurtz, [facility] Counselor: Quinten Allerton is an 31 y.o. male came voluntarily to the [place] [facility] telling him they were going to kill him as is his baseline. Pt sts he is non-complaint with his medications and has not followed through with [facility] as planned. Pt denies SI, HI, ASI and VH. Pt is a frequent pt in the ED with over 30+ visits later part of 2017 and to date in 2018.  Per pt record, pt is possibly malingering for shelter an he has stated in the past that he ahs been homeless for a long period. Pt denies homelessness today. Pt has a previous daignosis of schizophrenia.   Pt sts he lives alone. Pt sts he is single and has no support from family or friends. Pt sts that he is employed at [employer] and has been employed there for  about a year. Pt sts he graduated high school. Pt began services recently at [facility] but sts he has not followed up and is not complaint with his medication as is his pattern. Pt denies a hx of violence or aggressiveness. Per pt record, he has a hx of arrests including urinating in public, possession of marijuana and expired registration. Pt denies he is on probation or has any pending charges. Pt denies access to firearms. Pt has had multiple psychiatric hospitalizations including 2 at [facility] in 2017.  Pt denies a hx of abuse. Pt sts he sleeps 8+ hours per day and eats well with no significant weight changes recently. Pt denies symptoms of depression and anxiety. Pt sts he uses alcohol 4-5 times per week drinking 2-40 oz beers each time. Pt sts he smokes cannabis 1-2 times per week. Pt sts he smokes about 1 pack of cigarettes every 3 days. Pt's BAL was 94 and UDS tested positive for THC when tested tonight in the ED.    Diagnosis: Schizophrenia by hx  Today during tele psych consult: Pt was seen and chart reviewed. Cole Barrett is a 31 year old male who presents to the [facility] complaining of auditory hallucinations. Today  Pt denies suicidal/homicidal ideation, denies visual hallucinations and does not appear to be responding to internal stimuli. Pt endorses auditory hallucinations but stated because he got some medicine last night the voices are better and he is cool. Pt stated he feels much better today and is ready to be discharged. Pt was calm and cooperative, alert & oriented x  4, dressed in paper scrubs and sitting on the hospital bed. Pt was reminded to [facility] with [facility] for [facility] management and to  Refrain from alcohol and drug use. Pt is well known to this hospital system and has had 83 ED visits in the past 6 months with some being in the same day at different Ed's. Pt has a high degree of secondary gain by seeking inpatient hospitalization and ED admissions due to his homelessness.    Discussed case with Dr Dwyane Dee who recommends pt can be discharged with follow up at [facility].   Past Psychiatric History: Homelessness, Auditory hallucinations, Alcohol use disorder, Paranoid schizophrenia  Risk to Self: Suicidal Ideation: No Suicidal Intent: No Is patient at risk for suicide?: No Suicidal Plan?: No Access to Means: No What has been your use of drugs/alcohol within the last 12 months?:  (DAILY USE) How many times?:  (0) Other Self Harm Risks:  (DENIES) Triggers for Past Attempts: None known Intentional Self Injurious Behavior: None Risk to Others: Homicidal Ideation: No Thoughts of Harm to Others: No Comment - Thoughts of Harm to Others:  (DENIES) Current Homicidal Intent: No Current Homicidal Plan: No Access to Homicidal Means: No Identified Victim:  (NONE REPORTED) History of harm to others?: No Assessment of Violence: None Noted Violent Behavior Description:  (DENIES) Does patient have access to weapons?: No Criminal Charges Pending?: No Describe Pending Criminal Charges:  (DENIES) Does patient have a court date: No Prior Inpatient Therapy: Prior Inpatient Therapy: Yes Prior Therapy Dates:  (2016, 2017) Prior Therapy Facilty/Provider(s):  [facility] ) Reason for Treatment:  (SCHIZOPHRENIA) Prior Outpatient Therapy: Prior Outpatient Therapy: No Prior Therapy Dates:  (NOT FOLLOWING THROUGH-NOT TAKING MEDS AS IS PATTERN) Prior Therapy Facilty/Provider(s):  [facility]) Reason for Treatment:  (SCHIZOPHRENIA) Does patient have an ACCT team?: No Does patient have Intensive In-House Services?  : No Does patient have [facility] services? : Yes Does patient have P4CC services?: No  Past Medical History:  Past Medical History:  Diagnosis Date  . Homelessness   . Hypercholesterolemia   . Mental disorder   . Paranoid behavior ([place])   . PE (pulmonary embolism)   . Schizophrenia ([place])    History reviewed. No pertinent surgical history. Family History:  Family  History  Problem Relation Age of Onset  . Hypertension Mother    Family Psychiatric  History: Unknown Social History:  History  Alcohol Use  . Yes     History  Drug Use  . Types: Marijuana    Comment: Once every 2-3 months, Last used: Last month    Social History   Social History  . Marital status: Single    Spouse name: N/A  . Number of children: N/A  . Years of education: N/A   Social History Main Topics  . Smoking status: Current Every Day Smoker    Packs/day: 0.50    Types: Cigarettes  . Smokeless tobacco: Never Used  . Alcohol use Yes  . Drug use: Yes    Types: Marijuana     Comment: Once every 2-3 months, Last used: Last month  . Sexual activity: No   Other Topics Concern  . None   Social History Narrative  . None   Additional Social History:    Allergies:   Allergies  Allergen Reactions  . Haldol [Haloperidol] Shortness Of Breath    Labs:  Results for orders placed or performed during the hospital encounter of 01/19/17 (from the past 48 hour(s))  Comprehensive metabolic panel  Status: Abnormal   Collection Time: 01/19/17 12:59 AM  Result Value Ref Range   Sodium 139 135 - 145 mmol/L   Potassium 3.5 3.5 - 5.1 mmol/L   Chloride 103 101 - 111 mmol/L   CO2 23 22 - 32 mmol/L   Glucose, Bld 93 65 - 99 mg/dL   BUN 6 6 - 20 mg/dL   Creatinine, Ser 0.97 0.61 - 1.24 mg/dL   Calcium 8.7 (L) 8.9 - 10.3 mg/dL   Total Protein 6.9 6.5 - 8.1 g/dL   Albumin 4.0 3.5 - 5.0 g/dL   AST 27 15 - 41 U/L   ALT 31 17 - 63 U/L   Alkaline Phosphatase 54 38 - 126 U/L   Total Bilirubin 0.4 0.3 - 1.2 mg/dL   GFR calc non Af Amer >60 >60 mL/min   GFR calc Af Amer >60 >60 mL/min    Comment: (NOTE) The eGFR has been calculated using the CKD EPI equation. This calculation has not been validated in all clinical situations. eGFR's persistently <60 mL/min signify possible Chronic Kidney Disease.    Anion gap 13 5 - 15  cbc     Status: Abnormal   Collection Time:  01/19/17 12:59 AM  Result Value Ref Range   WBC 4.2 4.0 - 10.5 K/uL   RBC 4.59 4.22 - 5.81 MIL/uL   Hemoglobin 12.6 (L) 13.0 - 17.0 g/dL   HCT 38.4 (L) 39.0 - 52.0 %   MCV 83.7 78.0 - 100.0 fL   MCH 27.5 26.0 - 34.0 pg   MCHC 32.8 30.0 - 36.0 g/dL   RDW 13.7 11.5 - 15.5 %   Platelets 283 150 - 400 K/uL  Rapid urine drug screen (hospital performed)     Status: Abnormal   Collection Time: 01/19/17 12:59 AM  Result Value Ref Range   Opiates NONE DETECTED NONE DETECTED   Cocaine NONE DETECTED NONE DETECTED   Benzodiazepines NONE DETECTED NONE DETECTED   Amphetamines NONE DETECTED NONE DETECTED   Tetrahydrocannabinol POSITIVE (A) NONE DETECTED   Barbiturates NONE DETECTED NONE DETECTED    Comment:        DRUG SCREEN FOR MEDICAL PURPOSES ONLY.  IF CONFIRMATION IS NEEDED FOR ANY PURPOSE, NOTIFY LAB WITHIN 5 DAYS.        LOWEST DETECTABLE LIMITS FOR URINE DRUG SCREEN Drug Class       Cutoff (ng/mL) Amphetamine      1000 Barbiturate      200 Benzodiazepine   409 Tricyclics       735 Opiates          300 Cocaine          300 THC              50   Ethanol     Status: Abnormal   Collection Time: 01/19/17  1:01 AM  Result Value Ref Range   Alcohol, Ethyl (B) 94 (H) <5 mg/dL    Comment:        LOWEST DETECTABLE LIMIT FOR SERUM ALCOHOL IS 5 mg/dL FOR MEDICAL PURPOSES ONLY   Salicylate level     Status: None   Collection Time: 01/19/17  1:01 AM  Result Value Ref Range   Salicylate Lvl <3.2 2.8 - 30.0 mg/dL  Acetaminophen level     Status: Abnormal   Collection Time: 01/19/17  1:01 AM  Result Value Ref Range   Acetaminophen (Tylenol), Serum <10 (L) 10 - 30 ug/mL    Comment:  THERAPEUTIC CONCENTRATIONS VARY SIGNIFICANTLY. A RANGE OF 10-30 ug/mL MAY BE AN EFFECTIVE CONCENTRATION FOR MANY PATIENTS. HOWEVER, SOME ARE BEST TREATED AT CONCENTRATIONS OUTSIDE THIS RANGE. ACETAMINOPHEN CONCENTRATIONS >150 ug/mL AT 4 HOURS AFTER INGESTION AND >50 ug/mL AT 12 HOURS AFTER  INGESTION ARE OFTEN ASSOCIATED WITH TOXIC REACTIONS.     Current Facility-Administered Medications  Medication Dose Route Frequency Provider Last Rate Last Dose  . acetaminophen (TYLENOL) tablet 650 mg  650 mg Oral Q4H PRN Larene Pickett, PA-C      . alum & mag hydroxide-simeth (MAALOX/MYLANTA) 200-200-20 MG/5ML suspension 30 mL  30 mL Oral PRN Larene Pickett, PA-C      . FLUoxetine (PROZAC) tablet 20 mg  20 mg Oral Daily Larene Pickett, PA-C   20 mg at 01/19/17 0944  . ibuprofen (ADVIL,MOTRIN) tablet 600 mg  600 mg Oral Q8H PRN Larene Pickett, PA-C      . LORazepam (ATIVAN) tablet 0-4 mg  0-4 mg Oral Q6H Larene Pickett, PA-C       Followed by  . [START ON 01/21/2017] LORazepam (ATIVAN) tablet 0-4 mg  0-4 mg Oral Q12H Larene Pickett, PA-C      . nicotine (NICODERM CQ - dosed in mg/24 hours) patch 21 mg  21 mg Transdermal Daily Larene Pickett, PA-C      . ondansetron [facility]) tablet 4 mg  4 mg Oral Q8H PRN Larene Pickett, PA-C      . risperiDONE (RISPERDAL) tablet 3 mg  3 mg Oral QHS Larene Pickett, PA-C      . thiamine (VITAMIN B-1) tablet 100 mg  100 mg Oral Daily Larene Pickett, PA-C   100 mg at 01/19/17 0944  . zolpidem (AMBIEN) tablet 5 mg  5 mg Oral QHS PRN Larene Pickett, PA-C       Current Outpatient Prescriptions  Medication Sig Dispense Refill  . FLUoxetine (PROZAC) 20 MG tablet Take 1 tablet (20 mg total) by mouth daily. 30 tablet 0  . risperiDONE (RISPERDAL) 3 MG tablet Take 1 tablet (3 mg total) by mouth at bedtime. (Patient not taking: Reported on 12/09/2016) 30 tablet 0  . risperiDONE (RISPERDAL) 3 MG tablet Take 1 tablet (3 mg total) by mouth at bedtime. 30 tablet 0    Musculoskeletal: Unable to assess: camera  Psychiatric Specialty Exam: Physical Exam  Review of Systems  Psychiatric/Behavioral: Positive for depression, hallucinations (auditory) and substance abuse (UDS + THC, BAL 94). Negative for memory loss and suicidal ideas. The patient is not nervous/anxious and  does not have insomnia.   All other systems reviewed and are negative.   Blood pressure 124/69, pulse 73, temperature 98 F (36.7 C), temperature source Oral, resp. rate 16, height '5\' 10"'$  (1.778 m), weight 99.8 kg (220 lb), SpO2 98 %.Body mass index is 31.57 kg/m.  General Appearance: Casual and Fairly Groomed  Eye Contact:  Good  Speech:  Clear and Coherent and Normal Rate  Volume:  Normal  Mood:  Depressed  Affect:  Congruent and Depressed  Thought Process:  Coherent and Linear  Orientation:  Full (Time, Place, and Person)  Thought Content:  Hallucinations: Auditory  Suicidal Thoughts:  No  Homicidal Thoughts:  No  Memory:  Immediate;   Good Recent;   Good Remote;   Fair  Judgement:  Fair  Insight:  Lacking  Psychomotor Activity:  Normal  Concentration:  Concentration: Good and Attention Span: Good  Recall:  Good  Fund of Knowledge:  Good  Language:  Good  Akathisia:  No  Handed:  Right  AIMS (if indicated):     Assets:  Agricultural consultant Physical Health Resilience Social Support  ADL's:  Intact  Cognition:  WNL  Sleep:        Treatment Plan Summary: Discharge Home  Follow up with [facility] for therapy/psychiatry and medication management Activity as tolerated Stay well hydrated and eat a nutritious diet Refrain from using alcohol and illegal drugs Maintain antipsychotic medication compliance with Prozac '20mg'$  QD PO   Disposition: No evidence of imminent risk to self or others at present.   Patient does not meet criteria for psychiatric inpatient admission. Supportive therapy provided about ongoing stressors. Discussed crisis plan, support from social network, calling 911, coming to the Emergency Department, and calling Suicide Hotline.  Ethelene Hal, NP 01/19/2017 9:46 AM

## 2017-01-19 NOTE — ED Provider Notes (Signed)
[facility]-EMERGENCY DEPT Provider Note   CSN: [id number] Arrival date & time: 01/19/17  0012     History   Chief Complaint Chief Complaint  Patient presents with  . Psychiatric Evaluation    HPI Cole Barrett is a 31 y.o. male.  The history is provided by the patient and medical records.    31 year old male with history of mental disorder, paranoid behavior, schizophrenia, homelessness, hypercholesterolemia, presenting the ED for auditory hallucinations.  Patient reports he is constantly hearing voices that are saying "i'm going to kill you" in his head.  States this is been ongoing for the past year, but got worse this evening. He states he understands that these voices are in his head, however he cannot control them. He denies any suicidal or homicidal ideation. No visual hallucinations. He denies any significant alcohol or drug use. Patient reports he is not currently taking his medications. He denies seeing a psychiatrist.  Past Medical History:  Diagnosis Date  . Homelessness   . Hypercholesterolemia   . Mental disorder   . Paranoid behavior (H[facility])   . PE (pulmonary embolism)   . Schizophrenia [facility](HCC)     Patient Active Problem List   Diagnosis Date Noted  . Alcohol abuse   . Auditory hallucination   . Alcohol abuse with alcohol-induced mood disorder (H[facility]) 09/08/2016  . Adjustment disorder with depressed mood 09/07/2016  . Homelessness 09/03/2016  . Person feigning illness 09/03/2016  . 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 Cannabis use disorder, mild, abuse 08/29/2016  . Paranoid schizophrenia (HCC) 08/26/2016  . Pulmonary emboli (HCC) 07/13/2016  . 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 Acute left flank pain 01/02/2014  . Pulmonary embolism, bilateral (H[facility]) 01/02/2014    History reviewed. No pertinent surgical history.     Home Medications    Prior to Admission medications   Medication Sig Start Date End Date Taking? Authorizing  Provider  FLUoxetine (PROZAC) 20 MG tablet Take 1 tablet (20 mg total) by mouth daily. 12/30/16   Azalia BilisKevin Campos, MD  risperiDONE (RISPERDAL) 3 MG tablet Take 1 tablet (3 mg total) by mouth at bedtime. Patient not taking: Reported on 12/09/2016 11/23/16   Marlon Peliffany Greene, PA-C  risperiDONE (RISPERDAL) 3 MG tablet Take 1 tablet (3 mg total) by mouth at bedtime. 12/30/16   Azalia BilisKevin Campos, MD    Family History Family History  Problem Relation Age of Onset  . Hypertension Mother     Social History Social History  Substance Use Topics  . Smoking status: Current Every Day Smoker    Packs/day: 0.50    Types: Cigarettes  . Smokeless tobacco: Never Used  . Alcohol use Yes     Allergies   Haldol [haloperidol]   Review of Systems Review of Systems  Psychiatric/Behavioral: Positive for hallucinations.  All other systems reviewed and are negative.    Physical Exam Updated Vital Signs BP 134/77 (BP Location: Left Arm)   Pulse 92   Temp 98 F (36.7 C) (Oral)   Resp 19   Ht 5\' 10"  (1.778 m)   Wt 99.8 kg   SpO2 96%   BMI 31.57 kg/m   Physical Exam  Constitutional: He is oriented to person, place, and time. He appears well-developed and well-nourished.  HENT:  Head: Normocephalic and atraumatic.  Mouth/Throat: Oropharynx is clear and moist.  Eyes: Conjunctivae and EOM are normal. Pupils are equal, round, and reactive to light.  Neck: Normal range of motion.  Cardiovascular: Normal rate, regular rhythm and  normal heart sounds.   Pulmonary/Chest: Effort normal and breath sounds normal.  Abdominal: Soft. Bowel sounds are normal.  Musculoskeletal: Normal range of motion.  Neurological: He is alert and oriented to person, place, and time.  Skin: Skin is warm and dry.  Psychiatric: He has a normal mood and affect. He is actively hallucinating. He expresses no homicidal and no suicidal ideation. He expresses no suicidal plans and no homicidal plans.  Endorses auditory hallucinations, no  visual Denies SI/HI  Nursing note and vitals reviewed.    ED Treatments / Results  Labs (all labs ordered are listed, but only abnormal results are displayed) Labs Reviewed  COMPREHENSIVE METABOLIC PANEL - Abnormal; Notable for the following:       Result Value   Calcium 8.7 (*)    All other components within normal limits  ETHANOL - Abnormal; Notable for the following:    Alcohol, Ethyl (B) 94 (*)    All other components within normal limits  ACETAMINOPHEN LEVEL - Abnormal; Notable for the following:    Acetaminophen (Tylenol), Serum <10 (*)    All other components within normal limits  CBC - Abnormal; Notable for the following:    Hemoglobin 12.6 (*)    HCT 38.4 (*)    All other components within normal limits  RAPID URINE DRUG SCREEN, HOSP PERFORMED - Abnormal; Notable for the following:    Tetrahydrocannabinol POSITIVE (*)    All other components within normal limits  SALICYLATE LEVEL    EKG  EKG Interpretation None       Radiology No results found.  Procedures Procedures (including critical care time)  Medications Ordered in ED Medications  thiamine (VITAMIN B-1) tablet 100 mg (not administered)    Or  thiamine (B-1) injection 100 mg (not administered)  LORazepam (ATIVAN) tablet 0-4 mg (0 mg Oral Not Given 01/19/17 0255)    Followed by  LORazepam (ATIVAN) tablet 0-4 mg (not administered)  alum & mag hydroxide-simeth (MAALOX/MYLANTA) 200-200-20 MG/5ML suspension 30 mL (not administered)  nicotine (NICODERM CQ - dosed in mg/24 hours) patch 21 mg (not administered)  zolpidem (AMBIEN) tablet 5 mg (not administered)  ibuprofen (ADVIL,MOTRIN) tablet 600 mg (not administered)  acetaminophen (TYLENOL) tablet 650 mg (not administered)  ondansetron (ZOFRAN) tablet 4 mg (not administered)  FLUoxetine (PROZAC) tablet 20 mg (not administered)  risperiDONE (RISPERDAL) tablet 3 mg (not administered)     Initial Impression / Assessment and Plan / ED Course  I have  reviewed the triage vital signs and the nursing notes.  Pertinent labs & imaging results that were available during my care of the patient were reviewed by me and considered in my medical decision making (see chart for details).  31 year old male here with auditory hallucinations. Reports hearing voices that state "I will kill you". Reports this is been ongoing for the past year, worse over the past few days. Patient is in no acute distress, vital signs are stable. He denies SI or HI at this time.  Screening lab work overall reassuring, ethanol 94.  UDS + for THC.  No signs of withdrawal at this time.  Patient on CIWA protocol.  Home meds ordered.  Will consult TTS.  TTS has evaluated patient and recommends observation and reassess with daytime physician for disposition.  Final Clinical Impressions(s) / ED Diagnoses   Final diagnoses:  Auditory hallucinations    New Prescriptions New Prescriptions   No medications on file     Garlon Hatchet, Cordelia Poche 01/19/17 [id number]  Gilda Crease, MD 01/19/17 [phone]

## 2017-01-19 NOTE — BH Assessment (Addendum)
Tele Assessment Note   Cole HawkingChristopher Adam Barrett is an 31 y.o. male [facility] c/o AV telling him they were going to kill him as is his baseline. Pt sts he is non-complaint with his medications and has not followed through with [facility] as planned. Pt denies SI, HI, ASI and VH. Pt is a frequent pt in the ED with over 30+ visits later part of 2017 and to date in 2018.  Per pt record, pt is possibly malingering for shelter an he has stated in the past that he ahs been homeless for a long period. Pt denies homelessness today. Pt has a previous daignosis of schizophrenia.   Pt sts he lives alone. Pt sts he is single and has no support from family or friends. Pt sts that he is employed at [employer] and has been employed there for about a year. Pt sts he graduated high school. Pt [facility] but sts he has not followed up and is not complaint with his medication as is his pattern. Pt denies a hx of violence or aggressiveness. Per pt record, he has a hx of arrests including urinating in public, possession of marijuana and expired registration. Pt denies he is on probation or has any pending charges. Pt denies access to firearms. Pt has had multiple psychiatric hospitalizations including [facility].  Pt denies a hx of abuse. Pt sts he sleeps 8+ hours per day and eats well with no significant weight changes recently. Pt denies symptoms of depression and anxiety. Pt sts he uses alcohol 4-5 times per week drinking 2-40 oz beers each time. Pt sts he smokes cannabis 1-2 times per week. Pt sts he smokes about 1 pack of cigarettes every 3 days. Pt's BAL was 94 and UDS tested positive for THC when tested tonight in the ED.   Pt was dressed in scrubs. Pt was drowsy, reluctant to sit up for the assessment, irritable and then, apathetic. Pt kept faireye contact, spoke in a slurred tone and at a slow pace. Pt moved in a slow, unbalanced manner when moving. Pt's thought  process was coherent and relevant and judgement was impaired.  No indication of delusional thinking or response to internal stimuli. Pt's mood was stated as neither depressed nor anxious and his blunted affect was incongruent.  Pt was oriented x 4, to person, place, time and situation.   Diagnosis: Schizophrenia by hx  Past Medical History:  Past Medical History:  Diagnosis Date  . Homelessness   . Hypercholesterolemia   . Mental disorder   . Paranoid behavior (HCC)   . PE (pulmonary embolism)   . Schizophrenia (HCC)     History reviewed. No pertinent surgical history.  Family History:  Family History  Problem Relation Age of Onset  . Hypertension Mother     Social History:  reports that he has been smoking Cigarettes.  He has been smoking about 0.50 packs per day. He has never used smokeless tobacco. He reports that he drinks alcohol. He reports that he uses drugs, including Marijuana.  Additional Social History:  Alcohol / Drug Use Prescriptions: SEE MAR History of alcohol / drug use?: Yes Longest period of sobriety (when/how long): UNKNONWN Substance #1 Name of Substance 1: ALCOHOL 1 - Age of First Use: TEENS 1 - Amount (size/oz): 2 - 40 OZ BEERS 1 - Frequency: 4-5 X WEEK 1 - Duration: ONGOING 1 - Last Use / Amount: 01/18/17 Substance #2 Name  of Substance 2: CANNABIS 2 - Age of First Use: TEENS 2 - Amount (size/oz): VARIES 2 - Frequency: 1 X 2 WEEKS 2 - Duration: ONGOING 2 - Last Use / Amount: 2 WEEKS AGO Substance #3 Name of Substance 3: NICOTINE/CIGARETTES 3 - Age of First Use: TEENS 3 - Amount (size/oz): 1 PACK 3 - Frequency: EVERY 3 DAYS 3 - Duration: ONGOING 3 - Last Use / Amount: 01/18/17  CIWA: CIWA-Ar BP: 134/77 Pulse Rate: 92 COWS:    PATIENT STRENGTHS: (choose at least two) Average or above average intelligence Communication skills Physical Health  Allergies:  Allergies  Allergen Reactions  . Haldol [Haloperidol] Shortness Of Breath     Home Medications:  (Not in a hospital admission)  OB/GYN Status:  No LMP for male patient.  General Assessment Data Location of Assessment: [facility] ED TTS Assessment: In system Is this a Tele or Face-to-Face Assessment?: Tele Assessment Is this an Initial Assessment or a Re-assessment for this encounter?: Initial Assessment Marital status: Single Living Arrangements: Alone Can pt return to current living arrangement?: Yes Admission Status: Voluntary Is patient capable of signing voluntary admission?: Yes Referral Source: Self/Family/Friend Insurance type:  (MEDICAID)     Crisis Care Plan Living Arrangements: Alone Legal Guardian:  (SELF) Name of Psychiatrist:  [facility]) Name of Therapist:  (NONE)  Education Status Is patient currently in school?: No Highest grade of school patient has completed:  (12)  Risk to self with the past 6 months Suicidal Ideation: No Has patient been a risk to self within the past 6 months prior to admission? : No Suicidal Intent: No Has patient had any suicidal intent within the past 6 months prior to admission? : No Is patient at risk for suicide?: No Suicidal Plan?: No Has patient had any suicidal plan within the past 6 months prior to admission? : No Access to Means: No What has been your use of drugs/alcohol within the last 12 months?:  (DAILY USE) Previous Attempts/Gestures: No How many times?:  (0) Other Self Harm Risks:  (DENIES) Triggers for Past Attempts: None known Intentional Self Injurious Behavior: None Family Suicide History: No Recent stressful life event(s):  (NONE REPORTED) Persecutory voices/beliefs?: No Depression: No Depression Symptoms:  (DENIES SYMPTOMS) Substance abuse history and/or treatment for substance abuse?: Yes Suicide prevention information given to non-admitted patients: Not applicable  Risk to Others within the past 6 months Homicidal Ideation: No Does patient have any lifetime risk of violence toward  others beyond the six months prior to admission? : No Thoughts of Harm to Others: No Comment - Thoughts of Harm to Others:  (DENIES) Current Homicidal Intent: No Current Homicidal Plan: No Access to Homicidal Means: No Identified Victim:  (NONE REPORTED) History of harm to others?: No Assessment of Violence: None Noted Violent Behavior Description:  (DENIES) Does patient have access to weapons?: No Criminal Charges Pending?: No Describe Pending Criminal Charges:  (DENIES) Does patient have a court date: No Is patient on probation?: No  Psychosis Hallucinations: Auditory (STS VOICE STS IT'S GONING TO KILL HIM) Delusions: None noted  Mental Status Report Appearance/Hygiene: Disheveled, Poor hygiene, In scrubs Eye Contact: Poor Motor Activity: Freedom of movement Speech: Logical/coherent, Slurred Level of Consciousness: Drowsy Mood: Apathetic, Irritable Affect: Apathetic Anxiety Level: None Thought Processes: Coherent, Relevant Judgement: Partial Orientation: Person, Place, Time, Situation Obsessive Compulsive Thoughts/Behaviors: None (NONE REPORTED)  Cognitive Functioning Concentration: Normal Memory: Recent Intact, Remote Intact IQ: Average Insight: Poor Impulse Control: Poor Appetite: Good Sleep: Increased Total Hours of  Sleep:  (8+) Vegetative Symptoms: None (NONE REPORTED)  ADLScreening [facility] Assessment Services) Patient's cognitive ability adequate to safely complete daily activities?: Yes Patient able to express need for assistance with ADLs?: Yes Independently performs ADLs?: Yes (appropriate for developmental age) (NO BARRIERS REPORTED)  Prior Inpatient Therapy Prior Inpatient Therapy: Yes Prior Therapy Dates:  (2016, 2017) Prior Therapy Facilty/Provider(s):  [facility] ) Reason for Treatment:  (SCHIZOPHRENIA)  Prior Outpatient Therapy Prior Outpatient Therapy: No Prior Therapy Dates:  (NOT FOLLOWING THROUGH-NOT TAKING MEDS AS IS PATTERN) Prior Therapy  Facilty/Provider(s):  [facility]) Reason for Treatment:  (SCHIZOPHRENIA) Does patient have an ACCT team?: No Does patient have Intensive In-House Services?  : No Does patient have [facility] services? : Yes Does patient have P4CC services?: No  ADL Screening (condition at time of admission) Patient's cognitive ability adequate to safely complete daily activities?: Yes Patient able to express need for assistance with ADLs?: Yes Independently performs ADLs?: Yes (appropriate for developmental age) (NO BARRIERS REPORTED)       Abuse/Neglect Assessment (Assessment to be complete while patient is alone) Physical Abuse: Denies Verbal Abuse: Denies Sexual Abuse: Denies Exploitation of patient/patient's resources: Denies Self-Neglect: Denies     Merchant navy officer (For Healthcare) Does Patient Have a Medical Advance Directive?: No Would patient like information on creating a medical advance directive?: No - Patient declined    Additional Information 1:1 In Past 12 Months?: Yes CIRT Risk: No Elopement Risk: No Does patient have medical clearance?: Yes     Disposition:  Disposition Initial Assessment Completed for this Encounter: Yes Disposition of Patient: Other dispositions Other disposition(s): Other (Comment) (PENDING REVIEW [facility] EXTENDER)  Per Donell Sievert, PA: Recommend an ED Care Plan to address frequent ED visits with same sx and compliance issues. Recommend observation overnight & re-evaluation by psychiatry during daytime shift for final disposition.  Spoke with Dr. Blinda Leatherwood, EDP at [facility]: Advised of recommendations.   Beryle Flock, MS, CRC, [facility] Triage Specialist [facility] T 01/19/2017 3:16 AM

## 2017-01-19 NOTE — ED Notes (Signed)
Pt belongings logged, 4 bags of belonging locked in cabinet in room Cole Barrett walkman and Rowley ID placed in safe with security env [phone] 2 bottles of Risperidone taken to pharmacy for storage.

## 2017-01-22 ENCOUNTER — Emergency Department (HOSPITAL_COMMUNITY)
Admission: EM | Admit: 2017-01-22 | Discharge: 2017-01-22 | Disposition: A | Discharge status: Home / Self Care | Payer: Medicaid Other | Attending: Emergency Medicine | Admitting: Emergency Medicine

## 2017-01-22 ENCOUNTER — Emergency Department (HOSPITAL_COMMUNITY)
Admission: EM | Admit: 2017-01-22 | Discharge: 2017-01-24 | Disposition: A | Discharge status: Home / Self Care | Payer: Medicaid Other | Source: Home / Self Care | Attending: Emergency Medicine | Admitting: Emergency Medicine

## 2017-01-22 ENCOUNTER — Encounter (HOSPITAL_COMMUNITY): Payer: Self-pay | Admitting: Emergency Medicine

## 2017-01-22 DIAGNOSIS — F1014 Alcohol abuse with alcohol-induced mood disorder: Secondary | ICD-10-CM | POA: Insufficient documentation

## 2017-01-22 DIAGNOSIS — R45851 Suicidal ideations: Secondary | ICD-10-CM | POA: Diagnosis not present

## 2017-01-22 DIAGNOSIS — Z5900 Homelessness unspecified: Secondary | ICD-10-CM

## 2017-01-22 DIAGNOSIS — F17210 Nicotine dependence, cigarettes, uncomplicated: Secondary | ICD-10-CM | POA: Insufficient documentation

## 2017-01-22 DIAGNOSIS — F4321 Adjustment disorder with depressed mood: Secondary | ICD-10-CM | POA: Diagnosis present

## 2017-01-22 DIAGNOSIS — Z79899 Other long term (current) drug therapy: Secondary | ICD-10-CM | POA: Diagnosis not present

## 2017-01-22 DIAGNOSIS — Z91199 Patient's noncompliance with other medical treatment and regimen due to unspecified reason: Secondary | ICD-10-CM

## 2017-01-22 DIAGNOSIS — R440 Auditory hallucinations: Secondary | ICD-10-CM | POA: Diagnosis present

## 2017-01-22 DIAGNOSIS — Z9119 Patient's noncompliance with other medical treatment and regimen: Secondary | ICD-10-CM

## 2017-01-22 DIAGNOSIS — R443 Hallucinations, unspecified: Secondary | ICD-10-CM

## 2017-01-22 LAB — ETHANOL
Alcohol, Ethyl (B): <5 mg/dL (ref <5)
Alcohol, Ethyl (B): <5 mg/dL (ref <5)

## 2017-01-22 LAB — COMPREHENSIVE METABOLIC PANEL
ALBUMIN: 4.1 g/dL (ref 3.5–5.0)
ALBUMIN: 4.5 g/dL (ref 3.5–5.0)
ALK PHOS: 56 U/L (ref 38–126)
ALK PHOS: 58 U/L (ref 38–126)
ALT: 37 U/L (ref 17–63)
ALT: 35 U/L (ref 17–63)
ANION GAP: 12 (ref 5–15)
ANION GAP: 7 (ref 5–15)
AST: 30 U/L (ref 15–41)
AST: 30 U/L (ref 15–41)
BILIRUBIN TOTAL: 0.3 mg/dL (ref 0.3–1.2)
BILIRUBIN TOTAL: 0.5 mg/dL (ref 0.3–1.2)
BUN: 13 mg/dL (ref 6–20)
BUN: 8 mg/dL (ref 6–20)
CALCIUM: 9 mg/dL (ref 8.9–10.3)
CALCIUM: 9.1 mg/dL (ref 8.9–10.3)
CO2: 25 mmol/L (ref 22–32)
CO2: 23 mmol/L (ref 22–32)
CREATININE: 1 mg/dL (ref 0.61–1.24)
Chloride: 107 mmol/L (ref 101–111)
Chloride: 103 mmol/L (ref 101–111)
Creatinine, Ser: 1.06 mg/dL (ref 0.61–1.24)
GFR calc Af Amer: >60 mL/min (ref >60)
GFR calc non Af Amer: >60 mL/min (ref >60)
GLUCOSE: 90 mg/dL (ref 65–99)
GLUCOSE: 98 mg/dL (ref 65–99)
POTASSIUM: 3.8 mmol/L (ref 3.5–5.1)
Potassium: 3.5 mmol/L (ref 3.5–5.1)
SODIUM: 138 mmol/L (ref 135–145)
Sodium: 139 mmol/L (ref 135–145)
TOTAL PROTEIN: 7.1 g/dL (ref 6.5–8.1)
TOTAL PROTEIN: 7.8 g/dL (ref 6.5–8.1)

## 2017-01-22 LAB — RAPID URINE DRUG SCREEN, HOSP PERFORMED
Amphetamines: NOT DETECTED
BARBITURATES: NOT DETECTED
Benzodiazepines: NOT DETECTED
COCAINE: NOT DETECTED
Opiates: NOT DETECTED
TETRAHYDROCANNABINOL: NOT DETECTED

## 2017-01-22 LAB — CBC
HEMATOCRIT: 38.9 % — AB (ref 39.0–52.0)
HEMATOCRIT: 39.7 % (ref 39.0–52.0)
HEMOGLOBIN: 13 g/dL (ref 13.0–17.0)
HEMOGLOBIN: 13.2 g/dL (ref 13.0–17.0)
MCH: 27.5 pg (ref 26.0–34.0)
MCH: 27.9 pg (ref 26.0–34.0)
MCHC: 33.2 g/dL (ref 30.0–36.0)
MCHC: 33.4 g/dL (ref 30.0–36.0)
MCV: 82.2 fL (ref 78.0–100.0)
MCV: 83.9 fL (ref 78.0–100.0)
Platelets: 316 10*3/uL (ref 150–400)
Platelets: 285 10*3/uL (ref 150–400)
RBC: 4.73 MIL/uL (ref 4.22–5.81)
RBC: 4.73 MIL/uL (ref 4.22–5.81)
RDW: 13.7 % (ref 11.5–15.5)
RDW: 13.5 % (ref 11.5–15.5)
WBC: 4.7 10*3/uL (ref 4.0–10.5)
WBC: 4.5 10*3/uL (ref 4.0–10.5)

## 2017-01-22 LAB — SALICYLATE LEVEL: Salicylate Lvl: <7 mg/dL (ref 2.8–30.0)

## 2017-01-22 LAB — ACETAMINOPHEN LEVEL: Acetaminophen (Tylenol), Serum: <10 ug/mL — ABNORMAL LOW (ref 10–30)

## 2017-01-22 MED ORDER — FLUOXETINE HCL 20 MG PO TABS: 20.0000 mg | ORAL_TABLET | Freq: Every day | ORAL | 0 refills | Status: DC

## 2017-01-22 MED ORDER — RISPERIDONE 3 MG PO TABS: 3.0000 mg | ORAL_TABLET | Freq: Every day | ORAL | 0 refills | Status: DC

## 2017-01-22 NOTE — ED Notes (Addendum)
Pt wanded by security; pt found to have NO harmful objects on him

## 2017-01-22 NOTE — ED Notes (Signed)
ED Provider at bedside. 

## 2017-01-22 NOTE — Discharge Instructions (Signed)
For your ongoing mental health needs, you are advised to follow up with [facility].  New and returning patients are seen at their walk-in clinic.  Walk-in hours are Monday - Friday from 8:00 am - 3:00 pm.  Walk-in patients are seen on a first come, first served basis.  Try to arrive as early as possible for he best chance of being seen the same day: ° °     [facility] °     [address] °     [place], [place] [address] °     [phone] °

## 2017-01-22 NOTE — ED Notes (Signed)
Pt states he does not have to urinate at this time.

## 2017-01-22 NOTE — BH Assessment (Signed)
Assessment Note  Cole Barrett is an 31 y.o. male, who [facility] and reports experiencing AH of "a voice telling me to kill myself."  Patient presented orientated x3, mood "okay", affect flat, speech pressured with periodic word salad, flight of ideas thought process.  Patient denied SI, HI, and VH.  Patient reports currently experiencing AH of a voice saying "kill yourself" during this assessment.  Patient reports consuming 2 (40) ounce beers 1x per week and using Cannabis, 1 joint, once per month.  He denied any other illicit substance use.  Patient reports receiving services from [facility] over a year ago.  Patient reports not being medication compliant.  Patient has multiple ER visits and did not follow up [facility].  [facility].  Patient will be re-evaluated later this morning.     Diagnosis: Schizophrenia, unspecified  Past Medical History:  Past Medical History:  Diagnosis Date  . Homelessness   . Hypercholesterolemia   . Mental disorder   . Paranoid behavior (HCC)   . PE (pulmonary embolism)   . Schizophrenia (HCC)     History reviewed. No pertinent surgical history.  Family History:  Family History  Problem Relation Age of Onset  . Hypertension Mother     Social History:  reports that he has been smoking Cigarettes.  He has been smoking about 0.50 packs per day. He has never used smokeless tobacco. He reports that he drinks alcohol. He reports that he uses drugs, including Marijuana.  Additional Social History:     CIWA: CIWA-Ar BP: 141/73 Pulse Rate: 75 COWS:    Allergies:  Allergies  Allergen Reactions  . Haldol [Haloperidol] Shortness Of Breath    Home Medications:  (Not in a hospital admission)  OB/GYN Status:  No LMP for male patient.  General Assessment Data [facility] of [facility]: [facility] TTS Assessment: In system Is this a Tele or Face-to-Face Assessment?: Tele  Assessment Is this an Initial Assessment or a Re-assessment for this encounter?: Initial Assessment Marital status: Single Maiden name: N/A Is patient pregnant?: No Pregnancy Status: No Living Arrangements: Alone Can pt return to current living arrangement?: Yes Admission Status: Voluntary Is patient capable of signing voluntary admission?: Yes Referral Source: Self/Family/Friend Insurance type: Medicaid, [place]  Medical Screening Exam [facility] Walk[facility]in ONLY) Medical Exam completed: Yes  Crisis Care Plan Living Arrangements: Alone Legal Guardian: Other: (Self) Name of Psychiatrist: None Name of Therapist: None  Education Status Is patient currently in school?: No Current Grade: N/A Highest grade of school patient has completed: 12th Name of school: NA Contact person: NA  Risk to self with the past 6 months Suicidal Ideation: No-Not Currently/Within Last 6 Months Has patient been a risk to self within the past 6 months prior to admission? : Yes Suicidal Intent: No Has patient had any suicidal intent within the past 6 months prior to admission? : No Is patient at risk for suicide?: No Suicidal Plan?: No Has patient had any suicidal plan within the past 6 months prior to admission? : No Access to Means: No What has been your use of drugs/alcohol within the last 12 months?: Alcohol and Cannabis Previous Attempts/Gestures: No How many times?: 0 Other Self Harm Risks: None Triggers for Past Attempts: None known Intentional Self Injurious Behavior: None Family Suicide History: No Recent stressful life event(s): Other (Comment) (Auditory hallucinations) Persecutory voices/beliefs?: Yes Depression: No Substance abuse history and/or treatment for substance abuse?: Yes Suicide prevention information  given to non-admitted patients: Not applicable  Risk to Others within the past 6 months Homicidal Ideation: No Does patient have any lifetime risk of violence toward others beyond the six  months prior to admission? : No Thoughts of Harm to Others: No Comment - Thoughts of Harm to Others: None Current Homicidal Intent: No Current Homicidal Plan: No Access to Homicidal Means: No Identified Victim: N/A History of harm to others?: No Assessment of Violence: None Noted Violent Behavior Description: None Does patient have access to weapons?: No Criminal Charges Pending?: No Describe Pending Criminal Charges: N/A Does patient have a court date: No Is patient on probation?: No  Psychosis Hallucinations: Auditory Delusions: None noted  Mental Status Report Appearance/Hygiene: In hospital gown Eye Contact: Poor Motor Activity: Restlessness Speech: Word salad, Pressured Level of Consciousness: Alert Mood: Other (Comment) ("okay") Affect: Flat Anxiety Level: Moderate Thought Processes: Flight of Ideas Judgement: Impaired Orientation: Person, Place, Time Obsessive Compulsive Thoughts/Behaviors: None  Cognitive Functioning Concentration: Decreased Memory: Recent Impaired, Remote Impaired IQ: Average Insight: Poor Impulse Control: Fair Weight Loss: 0 Weight Gain: 0 Sleep: No Change Total Hours of Sleep: 8 Vegetative Symptoms: None  ADLScreening [facility] Assessment Services) Patient's cognitive ability adequate to safely complete daily activities?: Yes Patient able to express need for assistance with ADLs?: Yes Independently performs ADLs?: Yes (appropriate for developmental age)  Prior Inpatient Therapy Prior Inpatient Therapy: Yes Prior Therapy Dates: 2016 2017 Prior Therapy Facilty/Provider(s): [facility] Reason for Treatment: Schizophrenia  Prior Outpatient Therapy Prior Outpatient Therapy: Yes Prior Therapy Dates: 2017 Prior Therapy Facilty/Provider(s): [facility] Reason for Treatment: med management only, schizophrenia Does patient have an ACCT team?: No Does patient have Intensive In-House Services?  : No Does patient have [facility] services? : No Does patient  have P4CC services?: No  ADL Screening (condition at time of admission) Patient's cognitive ability adequate to safely complete daily activities?: Yes Is the patient deaf or have difficulty hearing?: No Does the patient have difficulty seeing, even when wearing glasses/contacts?: No Does the patient have difficulty concentrating, remembering, or making decisions?: No Patient able to express need for assistance with ADLs?: Yes Does the patient have difficulty dressing or bathing?: No Independently performs ADLs?: Yes (appropriate for developmental age) Does the patient have difficulty walking or climbing stairs?: No Weakness of Legs: None Weakness of Arms/Hands: None  Home Assistive Devices/Equipment Home Assistive Devices/Equipment: None    Abuse/Neglect Assessment (Assessment to be complete while patient is alone) Physical Abuse: Denies Verbal Abuse: Denies Sexual Abuse: Denies Exploitation of patient/patient's resources: Denies Self-Neglect: Denies Values / Beliefs Cultural Requests During Hospitalization: None Spiritual Requests During Hospitalization: None   Advance Directives (For Healthcare) Does Patient Have a Medical Advance Directive?: No Would patient like information on creating a medical advance directive?: No - Patient declined    Additional Information 1:1 In Past 12 Months?: Yes CIRT Risk: No Elopement Risk: No Does patient have medical clearance?: Yes     Disposition:  Disposition Initial Assessment Completed for this Encounter: Yes  On Site Evaluation by:   Reviewed with Physician:    Dey-Swartout,Keiva Dina 01/22/2017 3:37 AM

## 2017-01-22 NOTE — ED Provider Notes (Signed)
[facility]-EM[facility]ENC[facility] DEPT Provider Note   CSN: [id number] Arrival date & time: 01/22/17  2042     History   Chief Complaint Chief Complaint  Patient presents with  . Homicidal  . Hallucinations    auditory    HPI Cole Barrett is a 31 y.o. male.  This is a 31 year old male with a history of alcohol abuse, auditory hallucinations.  Adjustment disorder homelessness.  Cannabis use.  Paranoid schizophrenia and remote pulmonary emboli who presents 10 hours after [facility] where he was assessed for his auditory hallucinations.  A time.  He did not meet [facility].  He is [facility] stating that he needs to be in the hospital for 2 or 3 weeks with somebody watching him take his medicine and get him "back on track"  [facility] does get his medications from [facility].  He states they will only give him a 2-3 day supply."  He never gets a follow-up appointment."      Past Medical History:  Diagnosis Date  . Homelessness   . Hypercholesterolemia   . Mental disorder   . Paranoid behavior (HCC)   . PE (pulmonary embolism)   . Schizophrenia [facility](HCC)     Patient Active Problem List   Diagnosis Date Noted  . Alcohol abuse   . Auditory hallucinations   . Alcohol abuse with alcohol-induced mood disorder (HCC) 09/08/2016  . Adjustment disorder with depressed mood 09/07/2016  . Homelessness 09/03/2016  . Person feigning illness 09/03/2016  . 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 Cannabis use disorder, mild, abuse 08/29/2016  . Paranoid schizophrenia (HCC) 08/26/2016  . Pulmonary emboli (HCC) 07/13/2016  . 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 Acute left flank pain 01/02/2014  . Pulmonary embolism, bilateral (HCC) 01/02/2014    History reviewed. No pertinent surgical history.     Home Medications    Prior to Admission medications   Medication Sig Start Date End Date Taking?  Authorizing Provider  FLUoxetine (PROZAC) 20 MG tablet Take 1 tablet (20 mg total) by mouth daily. 01/22/17   Charm RingsJamison Y Lord, NP  risperiDONE (RISPERDAL) 3 MG tablet Take 1 tablet (3 mg total) by mouth at bedtime. Patient not taking: Reported on 12/09/2016 11/23/16   Marlon Peliffany Greene, PA-C  risperiDONE (RISPERDAL) 3 MG tablet Take 1 tablet (3 mg total) by mouth at bedtime. 01/22/17   Charm RingsJamison Y Lord, NP    Family History Family History  Problem Relation Age of Onset  . Hypertension Mother     Social History Social History  Substance Use Topics  . Smoking status: Current Every Day Smoker    Packs/day: 0.50    Types: Cigarettes  . Smokeless tobacco: Never Used  . Alcohol use Yes     Allergies   Haldol [haloperidol]   Review of Systems Review of Systems  Psychiatric/Behavioral: Positive for agitation and hallucinations. Negative for suicidal ideas.  All other systems reviewed and are negative.    Physical Exam Updated Vital Signs BP 137/84 (BP Location: Right Arm)   Pulse 85   Temp 98.3 F (36.8 C) (Oral)   SpO2 99%   Physical Exam  Constitutional: He appears well-developed and well-nourished.  HENT:  Head: Normocephalic.  Eyes: Pupils are equal, round, and reactive to light.  Neck: Normal range of motion.  Cardiovascular: Normal rate.   Pulmonary/Chest: Effort normal.  Musculoskeletal: Normal range of motion.  Neurological: He is alert.  Psychiatric: His mood appears anxious. His speech is rapid and/or pressured. He is agitated and actively hallucinating. He expresses homicidal ideation. He expresses no suicidal plans and no homicidal plans.  Voice are telling him to hurt others   Nursing note and vitals reviewed.    ED Treatments / Results  Labs (all labs ordered are listed, but only abnormal results are displayed) Labs Reviewed  ACETAMINOPHEN LEVEL - Abnormal; Notable for the following:       Result Value   Acetaminophen (Tylenol), Serum <10 (*)    All  other components within normal limits  COMPREHENSIVE METABOLIC PANEL  ETHANOL  SALICYLATE LEVEL  CBC  RAPID URINE DRUG SCREEN, HOSP PERFORMED    EKG  EKG Interpretation None       Radiology No results found.  Procedures Procedures (including critical care time)  Medications Ordered in ED Medications - No data to display   Initial Impression / Assessment and Plan / ED Course  I have reviewed the triage vital signs and the nursing notes.  Pertinent labs & imaging results that were available during my care of the patient were reviewed by me and considered in my medical decision making (see chart for details).        Final Clinical Impressions(s) / ED Diagnoses   Final diagnoses:  None    New Prescriptions New Prescriptions   No medications on file     Earley Favor, NP 01/22/17 2155    Mancel Bale, MD 01/22/17 [phone]

## 2017-01-22 NOTE — ED Triage Notes (Signed)
Patient is complaining of having hallucinations. Patient is not SI/HI. Patient states the voices keep talking in his head. The voices are not telling him to do anything. Patient states he wants it to stop.

## 2017-01-22 NOTE — Progress Notes (Signed)
TTS consult in progress. °

## 2017-01-22 NOTE — ED Provider Notes (Signed)
[facility]-EMERGENCY DEPT Provider Note   CSN: [id number] Arrival date & time: 01/22/17  0023     History   Chief Complaint Chief Complaint  Patient presents with  . Hallucinations    HPI Cole Barrett is a 31 y.o. male.  Patient presents with complaint of auditory hallucinations described as voices that are telling him to kill others and kill himself. He does not feel he wants to hurt others but is reporting that he is afraid he will harm himself. No self harm prior to coming to the hospital. He has no physical complaints.    The history is provided by the patient. No language interpreter was used.    Past Medical History:  Diagnosis Date  . Homelessness   . Hypercholesterolemia   . Mental disorder   . Paranoid behavior (HCC)   . PE (pulmonary embolism)   . Schizophrenia [facility])     Patient Active Problem List   Diagnosis Date Noted  . Alcohol abuse   . Auditory hallucinations   . Alcohol abuse with alcohol-induced mood disorder (HCC) 09/08/2016  . Adjustment disorder with depressed mood 09/07/2016  . Homelessness 09/03/2016  . Person feigning illness 09/03/2016  . 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 Cannabis use disorder, mild, abuse 08/29/2016  . Paranoid schizophrenia (HCC) 08/26/2016  . Pulmonary emboli (HCC) 07/13/2016  . 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 Acute left flank pain 01/02/2014  . Pulmonary embolism, bilateral (HCC) 01/02/2014    History reviewed. No pertinent surgical history.     Home Medications    Prior to Admission medications   Medication Sig Start Date End Date Taking? Authorizing Provider  FLUoxetine (PROZAC) 20 MG tablet Take 1 tablet (20 mg total) by mouth daily. 12/30/16  Yes Azalia Bilis, MD  risperiDONE (RISPERDAL) 3 MG tablet Take 1 tablet (3 mg total) by mouth at bedtime. 12/30/16  Yes Azalia Bilis, MD  risperiDONE (RISPERDAL) 3 MG tablet Take 1 tablet (3 mg total) by mouth at bedtime. Patient not  taking: Reported on 12/09/2016 11/23/16   Marlon Pel, PA-C    Family History Family History  Problem Relation Age of Onset  . Hypertension Mother     Social History Social History  Substance Use Topics  . Smoking status: Current Every Day Smoker    Packs/day: 0.50    Types: Cigarettes  . Smokeless tobacco: Never Used  . Alcohol use Yes     Allergies   Haldol [haloperidol]   Review of Systems Review of Systems  Constitutional: Negative for chills and fever.  HENT: Negative.   Respiratory: Negative.   Cardiovascular: Negative.   Gastrointestinal: Negative.   Musculoskeletal: Negative.   Skin: Negative.   Neurological: Negative.   Psychiatric/Behavioral: Positive for hallucinations and suicidal ideas.     Physical Exam Updated Vital Signs BP 141/73 (BP Location: Right Arm)   Pulse 75   Temp 97.5 F (36.4 C) (Oral)   Resp 18   Ht 5\' 10"  (1.778 m)   Wt 99.8 kg   SpO2 97%   BMI 31.57 kg/m   Physical Exam  Constitutional: He is oriented to person, place, and time. He appears well-developed and well-nourished.  HENT:  Head: Normocephalic.  Neck: Normal range of motion. Neck supple.  Cardiovascular: Normal rate and regular rhythm.   Pulmonary/Chest: Effort normal and breath sounds normal. He has no wheezes. He has no rales.  Abdominal: Soft. Bowel sounds are normal. There is no tenderness. There is no rebound and no  guarding.  Musculoskeletal: Normal range of motion.  Neurological: He is alert and oriented to person, place, and time.  Skin: Skin is warm and dry. No rash noted.  Psychiatric: He has a normal mood and affect.     ED Treatments / Results  Labs (all labs ordered are listed, but only abnormal results are displayed) Labs Reviewed  ACETAMINOPHEN LEVEL - Abnormal; Notable for the following:       Result Value   Acetaminophen (Tylenol), Serum <10 (*)    All other components within normal limits  CBC - Abnormal; Notable for the following:     HCT 38.9 (*)    All other components within normal limits  COMPREHENSIVE METABOLIC PANEL  ETHANOL  SALICYLATE LEVEL  RAPID URINE DRUG SCREEN, HOSP PERFORMED   Results for orders placed or performed during the hospital encounter of 01/22/17  Comprehensive metabolic panel  Result Value Ref Range   Sodium 139 135 - 145 mmol/L   Potassium 3.8 3.5 - 5.1 mmol/L   Chloride 107 101 - 111 mmol/L   CO2 25 22 - 32 mmol/L   Glucose, Bld 90 65 - 99 mg/dL   BUN 13 6 - 20 mg/dL   Creatinine, Ser 1.611.06 0.61 - 1.24 mg/dL   Calcium 9.1 8.9 - 09.610.3 mg/dL   Total Protein 7.8 6.5 - 8.1 g/dL   Albumin 4.5 3.5 - 5.0 g/dL   AST 30 15 - 41 U/L   ALT 37 17 - 63 U/L   Alkaline Phosphatase 58 38 - 126 U/L   Total Bilirubin 0.5 0.3 - 1.2 mg/dL   GFR calc non Af Amer >60 >60 mL/min   GFR calc Af Amer >60 >60 mL/min   Anion gap 7 5 - 15  Ethanol  Result Value Ref Range   Alcohol, Ethyl (B) <5 <5 mg/dL  Salicylate level  Result Value Ref Range   Salicylate Lvl <7.0 2.8 - 30.0 mg/dL  Acetaminophen level  Result Value Ref Range   Acetaminophen (Tylenol), Serum <10 (L) 10 - 30 ug/mL  cbc  Result Value Ref Range   WBC 4.7 4.0 - 10.5 K/uL   RBC 4.73 4.22 - 5.81 MIL/uL   Hemoglobin 13.0 13.0 - 17.0 g/dL   HCT 04.538.9 (L) 40.939.0 - 81.152.0 %   MCV 82.2 78.0 - 100.0 fL   MCH 27.5 26.0 - 34.0 pg   MCHC 33.4 30.0 - 36.0 g/dL   RDW 91.413.7 78.211.5 - 95.615.5 %   Platelets 316 150 - 400 K/uL    EKG  EKG Interpretation None       Radiology No results found.  Procedures Procedures (including critical care time)  Medications Ordered in ED Medications - No data to display   Initial Impression / Assessment and Plan / ED Course  I have reviewed the triage vital signs and the nursing notes.  Pertinent labs & imaging results that were available during my care of the patient were reviewed by me and considered in my medical decision making (see chart for details).     Patient with psychiatric history presents with  auditory hallucinations causing fear that he will harm himself. TTS consultation determines the patient will need am psych evaluation.   Final Clinical Impressions(s) / ED Diagnoses   Final diagnoses:  None   1. Auditory hallucinations.   New Prescriptions New Prescriptions   No medications on file     Elpidio AnisShari Sylvain Hasten, PA-C 01/22/17 0355    Shon Batonourtney F Horton, MD 01/22/17 [phone]

## 2017-01-22 NOTE — ED Notes (Signed)
Pt reported to this RN as having drunk "a 40 earlier". Report not consistent with ETOH level.

## 2017-01-22 NOTE — [facility] Suicide Risk Assessment (Signed)
Suicide Risk Assessment  Discharge Assessment   [facility]   Principal Problem: Adjustment disorder with depressed mood Discharge Diagnoses:  Patient Active Problem List   Diagnosis Date Noted  . Alcohol abuse with alcohol-induced mood disorder (HCC) [F10.14] 09/08/2016    Priority: High  . Adjustment disorder with depressed mood [F43.21] 09/07/2016    Priority: High  . Homelessness [Z59.0] 09/03/2016    Priority: High  . Person feigning illness [Z76.5] 09/03/2016    Priority: High  . Alcohol abuse [F10.10]   . Auditory hallucinations [R44.0]   . 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 Cannabis use disorder, mild, abuse [F12.10] 08/29/2016  . Paranoid schizophrenia (HCC) [F20.0] 08/26/2016  . Pulmonary emboli (HCC) [I26.99] 07/13/2016  . Pulmonary embolism and infarction (HCC) [I26.99] 01/02/2014  . Acute left flank pain [R10.9] 01/02/2014  . Pulmonary embolism, bilateral (HCC) [I26.99] 01/02/2014    Total Time spent with patient: 45 minutes  Musculoskeletal: Strength & Muscle Tone: within normal limits Gait & Station: normal Patient leans: N/A  Psychiatric Specialty Exam:   Blood pressure 117/61, pulse 61, temperature 98.1 F (36.7 C), temperature source Oral, resp. rate 16, height 5\' 10"  (1.778 m), weight 99.8 kg (220 lb), SpO2 98 %.Body mass index is 31.57 kg/m.  General Appearance: Casual  Eye Contact::  Good  Speech:  Normal Rate409  Volume:  Normal  Mood:  Depressed, mild  Affect:  Congruent  Thought Process:  Coherent and Descriptions of Associations: Intact  Orientation:  Full (Time, Place, and Person)  Thought Content:  WDL  Suicidal Thoughts:  No  Homicidal Thoughts:  No  Memory:  Immediate;   Good Recent;   Good Remote;   Good  Judgement:  Fair  Insight:  Fair  Psychomotor Activity:  Normal  Concentration:  Good  Recall:  Good  Fund of Knowledge:Fair  Language: Good  Akathisia:  No  Handed:  Right  AIMS (if  indicated):     Assets:  Leisure Time Physical Health Resilience  Sleep:     Cognition: WNL  ADL's:  Intact   Mental Status Per Nursing Assessment::   On Admission:   hallucinations  Demographic Factors:  Male and Adolescent or young adult  Loss Factors: NA  Historical Factors: NA  Risk Reduction Factors:   Sense of responsibility to family and Positive therapeutic relationship  Continued Clinical Symptoms:  Depression, mild  Cognitive Features That Contribute To Risk:  None    Suicide Risk:  Minimal: No identifiable suicidal ideation.  Patients presenting with no risk factors but with morbid ruminations; may be classified as minimal risk based on the severity of the depressive symptoms    Plan Of Care/Follow-up recommendations:  Activity:  as tolerated  Diet:  heart healthy diet  Ramsha Lonigro, NP 01/22/2017, 10:29 AM

## 2017-01-22 NOTE — ED Triage Notes (Signed)
Pt presents to ER for auditory hallucinations "over a year"; pt denies hx of mental health problems; pt states the "voices are saying they gonna kill me" and "it makes me want to hurt other people because they wont leave me alone"; pt denies suicidal ideations at this time

## 2017-01-22 NOTE — ED Notes (Addendum)
Pt changing into wine colored scrubs ; staffing office called and states sitter not available until 7am 01/23/2017

## 2017-01-22 NOTE — ED Notes (Signed)
Bed: WA30 Expected date:  Expected time:  Means of arrival:  Comments: 

## 2017-01-23 NOTE — Progress Notes (Signed)
Per Karleen HampshireSpencer, PA recommend a.m. Psych evaluation Melquan Ernsberger K. Sherlon HandingHarris, LCAS-A, LPC-A, [facility]  Counselor 01/23/2017 9:51 PM

## 2017-01-23 NOTE — ED Notes (Signed)
Patient was given a snack and Drink, and A Regular Diet was ordered for Lunch. 

## 2017-01-23 NOTE — ED Notes (Signed)
Patient was given a snack and drink, and a Regular Diet was ordered for Dinner. 

## 2017-01-23 NOTE — ED Notes (Signed)
Pt currently doing TTS.  

## 2017-01-23 NOTE — ED Notes (Signed)
Pt currently sleeping but sitter said she knew what the pt liked and picked it out and set aside for when he wakes up

## 2017-01-23 NOTE — ED Notes (Signed)
Per [facility], TTS re-eval in am.

## 2017-01-23 NOTE — ED Notes (Signed)
Family at bedside. 

## 2017-01-23 NOTE — BH Assessment (Signed)
Tele Assessment Note   Cole HawkingChristopher Adam Barrett is an 31 y.o. male, African American, Single who [facility]. Per ED report current reason/presentation: male with a history of alcohol abuse, auditory hallucinations.  Adjustment disorder homelessness.  Cannabis use.  Paranoid schizophrenia and remote pulmonary emboli who presents 10 hours after [facility] where he was assessed for his auditory hallucinations.  A time.  He [facility].  [facility] stating that he needs to be in the hospital for 2 or 3 weeks with somebody watching him take his medicine and get him "back on track" .States he  get his medications from [facility].  He states they will only give him a 2-3 day supply."  He never gets a follow-up appointment." Patient states primary concern is of AH worsening, voices telling him they are going to kill him. Patient states compliant is to get help and seek med mngmt/stability for voices in head. Patient states he is currently homeless, and lists no current support channels/contacts. Patient states [facility].   Patient denies current SI and HI. Patient denies current VH, but acknowledges current AH with voices worsening telling to kill self. Patient acknowledges hx. Of S.A. With alcohol last use x 1 week ago with x 2 40 oz. ,and marijuana last use x 1 joint x 2 weeks ago. Current Urine screen was negative for substance and no alcohol levels. Patient acknowledges inpatient psych tx. [facility] and S.A. Patient states was last seen x 1 year [facility] for out[facility]. And med mngmt. Care for schizophrenia.   Patient is dressed in scrubs and is alert and oriented x4. Patient speech was within normal limits and motor behavior appeared normal. Patient thought process is coherent. Patient does not appear  to be responding to internal stimuli, but c/o voices in head. Patient was cooperative throughout the assessment and states that he is agreeable to inpatient psychiatric treatment.     Diagnosis: Schizophrenia  Past Medical History:  Past Medical History:  Diagnosis Date  . Homelessness   . Hypercholesterolemia   . Mental disorder   . Paranoid behavior (HCC)   . PE (pulmonary embolism)   . Schizophrenia (HCC)     History reviewed. No pertinent surgical history.  Family History:  Family History  Problem Relation Age of Onset  . Hypertension Mother     Social History:  reports that he has been smoking Cigarettes.  He has been smoking about 0.50 packs per day. He has never used smokeless tobacco. He reports that he drinks alcohol. He reports that he uses drugs, including Marijuana.  Additional Social History:  Alcohol / Drug Use Pain Medications: SEE MAR Prescriptions: SEE MAR Over the Counter: SEE MAR History of alcohol / drug use?: Yes Longest period of sobriety (when/how long): not specified Negative Consequences of Use: Financial, Legal, Personal relationships Withdrawal Symptoms: Patient aware of relationship between substance abuse and physical/medical complications Substance #1 Name of Substance 1: alcohol 1 - Age of First Use: unspecified 1 - Amount (size/oz):  2x 40 oz. per week 1 - Frequency: weekly 1 - Duration: years 1 - Last Use / Amount: 01-17-17, x 2 40 oz. Substance #2 Name of Substance 2: Cannabis 2 - Age of First Use: unspecified 2 - Amount (size/oz): x 1  joint 2 - Frequency: monthly 2 - Duration: years 2 - Last Use / Amount: x 2 weeks ago x 1 joint  CIWA: CIWA-Ar BP: 127/71 Pulse Rate: 63 COWS:    PATIENT STRENGTHS: (choose at least two) Active sense of humor Capable of independent living Communication skills  Allergies:  Allergies  Allergen Reactions  . Haldol [Haloperidol] Shortness Of Breath    Home Medications:  (Not in a hospital  admission)  OB/GYN Status:  No LMP for male patient.  General Assessment Data Location of Assessment: [facility] ED TTS Assessment: In system Is this a Tele or Face-to-Face Assessment?: Tele Assessment Is this an Initial Assessment or a Re-assessment for this encounter?: Initial Assessment Marital status: Single Maiden name: n/a Is patient pregnant?: No Pregnancy Status: No Living Arrangements: Other (Comment) (homeless) Can pt return to current living arrangement?: Yes Admission Status: Voluntary Is patient capable of signing voluntary admission?: Yes Referral Source: Self/Family/Friend Insurance type: Medicaid     Crisis Care Plan Living Arrangements: Other (Comment) (homeless) Legal Guardian: Other: (none) Name of Psychiatrist: Transport planner Name of Therapist: [facility]  Education Status Is patient currently in school?: No Current Grade: n/a Highest grade of school patient has completed: 12th Name of school: n/a Contact person: none given  Risk to self with the past 6 months Suicidal Ideation: No Has patient been a risk to self within the past 6 months prior to admission? : Yes Suicidal Intent: No Has patient had any suicidal intent within the past 6 months prior to admission? : No Is patient at risk for suicide?: No Suicidal Plan?: No Has patient had any suicidal plan within the past 6 months prior to admission? : No Access to Means: No What has been your use of drugs/alcohol within the last 12 months?: alchol, marijuana Previous Attempts/Gestures: No How many times?: 0 Other Self Harm Risks: none Triggers for Past Attempts: None known Intentional Self Injurious Behavior: None Family Suicide History: No Recent stressful life event(s): Other (Comment) Persecutory voices/beliefs?: Yes Depression: Yes Depression Symptoms: Tearfulness, Isolating, Fatigue, Guilt, Loss of interest in usual pleasures, Feeling worthless/self pity Substance abuse history and/or treatment for  substance abuse?: Yes Suicide prevention information given to non-admitted patients: Not applicable  Risk to Others within the past 6 months Homicidal Ideation: No Does patient have any lifetime risk of violence toward others beyond the six months prior to admission? : No Thoughts of Harm to Others: No Comment - Thoughts of Harm to Others: none Current Homicidal Intent: No Current Homicidal Plan: No Access to Homicidal Means: No Identified Victim: n/a History of harm to others?: No Assessment of Violence: None Noted Violent Behavior Description: none Does patient have access to weapons?: No Criminal Charges Pending?: No Describe Pending Criminal Charges: n Does patient have a court date: No Is patient on probation?: No  Psychosis Hallucinations: Auditory Delusions: None noted  Mental Status Report Appearance/Hygiene: In scrubs Eye Contact: Good Motor Activity: Restlessness Speech: Logical/coherent Level of Consciousness: Alert Mood: Depressed Affect: Flat Anxiety Level: Moderate Thought Processes: Relevant Judgement: Impaired Orientation: Person, Place, Time, Situation, Appropriate for developmental age Obsessive Compulsive Thoughts/Behaviors: None  Cognitive Functioning Concentration: Decreased Memory: Recent Intact, Remote Intact IQ: Average Insight: Fair Impulse Control: Poor Appetite: Good Weight Loss: 0 Weight Gain: 0 Sleep: No Change Total Hours of Sleep: 8 Vegetative Symptoms: None  ADLScreening [facility] Assessment Services) Patient's cognitive ability adequate to safely complete daily activities?: Yes Patient able to express need for assistance with ADLs?: Yes Independently performs ADLs?: Yes (appropriate  for developmental age)  Prior Inpatient Therapy Prior Inpatient Therapy: Yes Prior Therapy Dates: 2016, 2017 Prior Therapy Facilty/Provider(s): [facility] Reason for Treatment: Schizophrenia  Prior Outpatient Therapy Prior Outpatient Therapy: Yes Prior  Therapy Dates: 2017 Prior Therapy Facilty/Provider(s): [facility] Reason for Treatment: med. mngmt. Does patient have an ACCT team?: No Does patient have Intensive In-House Services?  : No Does patient have [facility] services? : No Does patient have P4CC services?: No  ADL Screening (condition at time of admission) Patient's cognitive ability adequate to safely complete daily activities?: Yes Is the patient deaf or have difficulty hearing?: No Does the patient have difficulty seeing, even when wearing glasses/contacts?: No Does the patient have difficulty concentrating, remembering, or making decisions?: No Patient able to express need for assistance with ADLs?: Yes Does the patient have difficulty dressing or bathing?: No Independently performs ADLs?: Yes (appropriate for developmental age) Does the patient have difficulty walking or climbing stairs?: No Weakness of Legs: None Weakness of Arms/Hands: None       Abuse/Neglect Assessment (Assessment to be complete while patient is alone) Physical Abuse: Denies Verbal Abuse: Denies Sexual Abuse: Denies Exploitation of patient/patient's resources: Denies Self-Neglect: Denies Values / Beliefs Cultural Requests During Hospitalization: None Spiritual Requests During Hospitalization: None   Advance Directives (For Healthcare) Does Patient Have a Medical Advance Directive?: No    Additional Information 1:1 In Past 12 Months?: Yes CIRT Risk: No Elopement Risk: No Does patient have medical clearance?: Yes     Disposition: Per Karleen Hampshire, PA recommend a.m. Psych evaluation Disposition Initial Assessment Completed for this Encounter: Yes Disposition of Patient: Other dispositions (TBA)  Hipolito Bayley 01/23/2017 9:36 PM

## 2017-01-23 NOTE — ED Notes (Signed)
Pt now sitting in room eating snack

## 2017-01-24 MED ORDER — BENZTROPINE MESYLATE 1 MG PO TABS: 1.0000 mg | ORAL_TABLET | Freq: Every day | ORAL | 0 refills | Status: DC

## 2017-01-24 MED ORDER — RISPERIDONE 3 MG PO TABS: 3.0000 mg | ORAL_TABLET | Freq: Every day | ORAL | 0 refills | Status: DC

## 2017-01-24 NOTE — BH Assessment (Signed)
Pt cooperative during reassessment. He denies SI and has no prior suicide attempts. He denies HI. Pt endorses [facility]. He reports voices that "say they are going to kill me." Pt sts [facility]. He [facility] so staff can "keep a watch on me." No delusions noted. Pt says that [facility].  Disposition pending telepsych evaluation by provider.  Evette Cristalaroline Paige Raquan Iannone, [place] Therapeutic Triage Specialist

## 2017-01-24 NOTE — Care Management (Signed)
CM was made aware patient does not have funds to pay co-pay for prescriptions. CM contacted patient's pharmcy [facility] on [address] they have agreed to  waive co-pay.  Patient was made aware.Marland Kitchen. [place] nurse [place].

## 2017-01-24 NOTE — ED Notes (Signed)
Pt ambulatory to bathroom and back to room 

## 2017-01-24 NOTE — ED Notes (Signed)
Security escorting pt to bus stop.

## 2017-01-24 NOTE — ED Notes (Addendum)
Pt states no one prescribes his medications so takes none - [facility] not been to [facility] since early last year.

## 2017-01-24 NOTE — ED Notes (Signed)
Telepsych being performed. 

## 2017-01-24 NOTE — ED Notes (Signed)
Re-TTS being performed.  

## 2017-01-24 NOTE — ED Notes (Signed)
Patient was given a snack and drink, and A Regular Diet was ordered for Lunch. 

## 2017-01-24 NOTE — Consult Note (Signed)
Telepsych Consultation   Reason for Consult:  Auditory Hallucinations Referring Physician:  EDP Patient Identification: Cole Barrett MRN:  [id number] Principal Diagnosis: <principal problem not specified>  Diagnosis:   Patient Active Problem List   Diagnosis Date Noted  . Alcohol abuse [F10.10]   . Auditory hallucinations [R44.0]   . Alcohol abuse with alcohol-induced mood disorder ([place]) [F10.14] 09/08/2016  . Adjustment disorder with depressed mood [F43.21] 09/07/2016  . Homelessness [Z59.0] 09/03/2016  . Person feigning illness [Z76.5] 09/03/2016  . 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 Cannabis use disorder, mild, abuse [F12.10] 08/29/2016  . Paranoid schizophrenia ([place]) [F20.0] 08/26/2016  . Pulmonary emboli ([place]) [I26.99] 07/13/2016  . Pulmonary embolism and infarction ([place]) [I26.99] 01/02/2014  . Acute left flank pain [R10.9] 01/02/2014  . Pulmonary embolism, bilateral ([place]) [I26.99] 01/02/2014    Total Time spent with patient: 15 minutes  Subjective:   Cole Barrett is a 31 y.o. male patient admitted with auditory hallucinations. I been trying to get to [facility] so I can get my medicine back, it helped me last time make the voices go away. I cant get a rx, and or the money to pay for it.   HPI:  Per tele assessment note on chart written by  [facility] Counselor: Cole Barrett is an 31 y.o. male came voluntarily to the [place] telling him they were going to kill him as is his baseline. Pt sts he is non-complaint with his medications and has not followed through with [facility] as planned. Pt denies SI, HI, ASI and VH. Pt is a frequent pt in the ED with over 50+ visits later part of 2017 and to date in 2018.  Pt denies homelessness today and reports he resides at [address]. Pt has a previous daignosis of schizophrenia.   Pt sts he lives alone. Pt sts he is single and has no support from family or friends. He reports his father is a  doctor, and mother is deceased. His family told him to come to the hospital to get help.  Pt sts he graduated high school. Pt began services recently at [facility] but sts he has not followed up and is not complaint with his medication as is his pattern. Pt denies a hx of violence or aggressiveness. Per pt record, he has a hx of arrests including urinating in public, possession of marijuana and expired registration. Pt denies he is on probation or has any pending charges. Pt denies access to firearms. Pt has had multiple psychiatric hospitalizations including 2 at [facility] in 2017.  Pt denies a hx of abuse. Pt sts he sleeps 8+ hours per day and eats well with no significant weight changes recently. Pt denies symptoms of depression and anxiety. Pt sts he uses alcohol 4-5 times per week drinking 2-40 oz beers each time. Pt sts he smokes cannabis 1-2 times per week. Pt sts he smokes about 1 pack of cigarettes every 3 days.  Diagnosis: Schizophrenia by hx  Today during tele psych consult: Pt was seen and chart reviewed. Cole Barrett is a 31 year old male who presents to the [facility] complaining of auditory hallucinations. Today  Pt denies suicidal/homicidal ideation, denies visual hallucinations and does not appear to be responding to internal stimuli. Pt endorses auditory hallucinations but stated they are still there and he needs medicine to help them stop. Pt was calm and cooperative, alert & oriented x 4, dressed in paper scrubs and sitting on the hospital bed.  Pt was reminded [facility] for [facility] and to Refrain from alcohol and drug use. Pt is well known to this hospital system and has had 79 ED visits in the past 6 months with some being in the same day at different Ed's. Pt has a high degree of secondary gain by seeking inpatient hospitalization and ED admissions due to his homelessness.   Discussed case with Dr Dwyane Dee who recommends pt can be discharged with follow up at  [facility]. He will be  given a prescription for Risperdal #44m po qhs and Cogentin 130mpo qhs for EPS.   Past Psychiatric History: Homelessness, Auditory hallucinations, Alcohol use disorder, Paranoid schizophrenia  Risk to Self: Suicidal Ideation: No Suicidal Intent: No Is patient at risk for suicide?: No Suicidal Plan?: No Access to Means: No What has been your use of drugs/alcohol within the last 12 months?: alchol, marijuana How many times?: 0 Other Self Harm Risks: none Triggers for Past Attempts: None known Intentional Self Injurious Behavior: None Risk to Others: Homicidal Ideation: No Thoughts of Harm to Others: No Comment - Thoughts of Harm to Others: none Current Homicidal Intent: No Current Homicidal Plan: No Access to Homicidal Means: No Identified Victim: n/a History of harm to others?: No Assessment of Violence: None Noted Violent Behavior Description: none Does patient have access to weapons?: No Criminal Charges Pending?: No Describe Pending Criminal Charges: n Does patient have a court date: No Prior Inpatient Therapy: Prior Inpatient Therapy: Yes Prior Therapy Dates: 2016, 2017 [facility] Schizophrenia Prior Outpatient Therapy: Prior Outpatient Therapy: Yes Prior Therapy Dates: 2017 Prior Therapy Facilty/Provider(s): [facility] Reason for Treatment: med. mngmt. Does patient have an ACCT team?: No Does patient have Intensive In-House Services?  : No Does patient have [facility] services? : No Does patient have P4CC services?: No  Past Medical History:  Past Medical History:  Diagnosis Date  . Homelessness   . Hypercholesterolemia   . Mental disorder   . Paranoid behavior ([place]  . PE (pulmonary embolism)   . Schizophrenia ([facility]   History reviewed. No pertinent surgical history. Family History:  Family History  Problem Relation Age of Onset  . Hypertension Mother    Family Psychiatric  History:  Unknown Social History:  History  Alcohol Use  . Yes     History  Drug Use  . Types: Marijuana    Comment: Once every 2-3 months, Last used: Last month    Social History   Social History  . Marital status: Single    Spouse name: N/A  . Number of children: N/A  . Years of education: N/A   Social History Main Topics  . Smoking status: Current Every Day Smoker    Packs/day: 0.50    Types: Cigarettes  . Smokeless tobacco: Never Used  . Alcohol use Yes  . Drug use: Yes    Types: Marijuana     Comment: Once every 2-3 months, Last used: Last month  . Sexual activity: No   Other Topics Concern  . None   Social History Narrative  . None   Additional Social History:    Allergies:   Allergies  Allergen Reactions  . Haldol [Haloperidol] Shortness Of Breath    Labs:  Results for orders placed or performed during the hospital encounter of 01/22/17 (from the past 48 hour(s))  Comprehensive metabolic panel     Status: None   Collection Time: 01/22/17  8:58 PM  Result Value Ref Range   Sodium 138 135 - 145 mmol/L   Potassium 3.5 3.5 - 5.1 mmol/L   Chloride 103 101 - 111 mmol/L   CO2 23 22 - 32 mmol/L   Glucose, Bld 98 65 - 99 mg/dL   BUN 8 6 - 20 mg/dL   Creatinine, Ser 1.00 0.61 - 1.24 mg/dL   Calcium 9.0 8.9 - 10.3 mg/dL   Total Protein 7.1 6.5 - 8.1 g/dL   Albumin 4.1 3.5 - 5.0 g/dL   AST 30 15 - 41 U/L   ALT 35 17 - 63 U/L   Alkaline Phosphatase 56 38 - 126 U/L   Total Bilirubin 0.3 0.3 - 1.2 mg/dL   GFR calc non Af Amer >60 >60 mL/min   GFR calc Af Amer >60 >60 mL/min    Comment: (NOTE) The eGFR has been calculated using the CKD EPI equation. This calculation has not been validated in all clinical situations. eGFR's persistently <60 mL/min signify possible Chronic Kidney Disease.    Anion gap 12 5 - 15  Ethanol     Status: None   Collection Time: 01/22/17  8:58 PM  Result Value Ref Range   Alcohol, Ethyl (B) <5 <5 mg/dL    Comment:        LOWEST  DETECTABLE LIMIT FOR SERUM ALCOHOL IS 5 mg/dL FOR MEDICAL PURPOSES ONLY   Salicylate level     Status: None   Collection Time: 01/22/17  8:58 PM  Result Value Ref Range   Salicylate Lvl <9.5 2.8 - 30.0 mg/dL  Acetaminophen level     Status: Abnormal   Collection Time: 01/22/17  8:58 PM  Result Value Ref Range   Acetaminophen (Tylenol), Serum <10 (L) 10 - 30 ug/mL    Comment:        THERAPEUTIC CONCENTRATIONS VARY SIGNIFICANTLY. A RANGE OF 10-30 ug/mL MAY BE AN EFFECTIVE CONCENTRATION FOR MANY PATIENTS. HOWEVER, SOME ARE BEST TREATED AT CONCENTRATIONS OUTSIDE THIS RANGE. ACETAMINOPHEN CONCENTRATIONS >150 ug/mL AT 4 HOURS AFTER INGESTION AND >50 ug/mL AT 12 HOURS AFTER INGESTION ARE OFTEN ASSOCIATED WITH TOXIC REACTIONS.   cbc     Status: None   Collection Time: 01/22/17  8:58 PM  Result Value Ref Range   WBC 4.5 4.0 - 10.5 K/uL   RBC 4.73 4.22 - 5.81 MIL/uL   Hemoglobin 13.2 13.0 - 17.0 g/dL   HCT 39.7 39.0 - 52.0 %   MCV 83.9 78.0 - 100.0 fL   MCH 27.9 26.0 - 34.0 pg   MCHC 33.2 30.0 - 36.0 g/dL   RDW 13.5 11.5 - 15.5 %   Platelets 285 150 - 400 K/uL  Rapid urine drug screen (hospital performed)     Status: None   Collection Time: 01/22/17  9:58 PM  Result Value Ref Range   Opiates NONE DETECTED NONE DETECTED   Cocaine NONE DETECTED NONE DETECTED   Benzodiazepines NONE DETECTED NONE DETECTED   Amphetamines NONE DETECTED NONE DETECTED   Tetrahydrocannabinol NONE DETECTED NONE DETECTED   Barbiturates NONE DETECTED NONE DETECTED    Comment:        DRUG SCREEN FOR MEDICAL PURPOSES ONLY.  IF CONFIRMATION IS NEEDED FOR ANY PURPOSE, NOTIFY LAB WITHIN 5 DAYS.        LOWEST DETECTABLE LIMITS FOR URINE DRUG SCREEN Drug Class       Cutoff (ng/mL) Amphetamine      1000 Barbiturate      200 Benzodiazepine   638 Tricyclics  300 Opiates          300 Cocaine          300 THC              50     No current facility-administered medications for this encounter.     Current Outpatient Prescriptions  Medication Sig Dispense Refill  . FLUoxetine (PROZAC) 20 MG tablet Take 1 tablet (20 mg total) by mouth daily. (Patient not taking: Reported on 01/22/2017) 30 tablet 0  . risperiDONE (RISPERDAL) 3 MG tablet Take 1 tablet (3 mg total) by mouth at bedtime. (Patient not taking: Reported on 12/09/2016) 30 tablet 0  . risperiDONE (RISPERDAL) 3 MG tablet Take 1 tablet (3 mg total) by mouth at bedtime. (Patient not taking: Reported on 01/22/2017) 30 tablet 0    Musculoskeletal: Unable to assess: camera  Psychiatric Specialty Exam: Physical Exam   Review of Systems  Psychiatric/Behavioral: Positive for depression, hallucinations (auditory) and substance abuse (UDS + THC, BAL 94). Negative for memory loss and suicidal ideas. The patient is not nervous/anxious and does not have insomnia.   All other systems reviewed and are negative.   Blood pressure 127/63, pulse 67, temperature 98.6 F (37 C), temperature source Oral, resp. rate 16, SpO2 98 %.There is no height or weight on file to calculate BMI.  General Appearance: Casual and Fairly Groomed  Eye Contact:  Good  Speech:  Clear and Coherent and Normal Rate  Volume:  Normal  Mood:  Depressed brightens upon approach  Affect:  Appropriate and Congruent  Thought Process:  Coherent and Linear  Orientation:  Full (Time, Place, and Person)  Thought Content:  Hallucinations: Auditory   Suicidal Thoughts:  No  Homicidal Thoughts:  No  Memory:  Immediate;   Good Recent;   Good Remote;   Fair  Judgement:  Fair  Insight:  Lacking  Psychomotor Activity:  Normal  Concentration:  Concentration: Good and Attention Span: Good  Recall:  Good  Fund of Knowledge:  Good  Language:  Good  Akathisia:  No  Handed:  Right  AIMS (if indicated):     Assets:  Agricultural consultant Physical Health Resilience Social Support  ADL's:  Intact  Cognition:  WNL  Sleep:        Treatment Plan  Summary: Discharge Home  Follow up with [facility] for therapy/psychiatry and medication management Activity as tolerated Stay well hydrated and eat a nutritious diet Refrain from using alcohol and illegal drugs Maintain antipsychotic medication compliance with Risperdal 51m po qhs and Cogentin 172mpo qhs. He is advised that if he continues to endorse SI and medication noncompliance may have to transition him to LAI (depot). He declines LAI at this time and states he will take the pills as directed.   Disposition: No evidence of imminent risk to self or others at present.   Patient does not meet criteria for psychiatric inpatient admission. Supportive therapy provided about ongoing stressors. Discussed crisis plan, support from social network, calling 911, coming to the Emergency Department, and calling Suicide Hotline.  TaNanci PinaFNP 01/24/2017 6:10 PM

## 2017-01-24 NOTE — ED Notes (Signed)
Pt given resources for homeless shelters and meal places from SW.

## 2017-01-24 NOTE — ED Notes (Signed)
Dr Anitra LauthPlunkett to e-scribe prescriptions for Risperdal 3mg  and Cogentin 1mg  - Cole MortimerWanda, CM, to contact [facility] [place] and will ask for them to waive pt's copay. Pt in agreement w/plan. RN had also contacted Cole HaJonathan, SW, in re: homeless shelters for pt. When asked pt if he receives disability so SW may assist w/placing him - advised yes - but would not tell amount and now states he has a home to go to - [address] and is not homeless.

## 2017-01-24 NOTE — ED Notes (Signed)
Cole BernJean, [facility], advised [facility] attempting to reach [name], NP, [facility] to ensure performing telepsych w/pt as was advised this am.

## 2017-01-29 ENCOUNTER — Emergency Department (HOSPITAL_COMMUNITY)
Admission: EM | Admit: 2017-01-29 | Discharge: 2017-01-30 | Disposition: A | Discharge status: Home / Self Care | Payer: Medicaid Other | Attending: Emergency Medicine | Admitting: Emergency Medicine

## 2017-01-29 ENCOUNTER — Encounter (HOSPITAL_COMMUNITY): Payer: Self-pay

## 2017-01-29 DIAGNOSIS — Y9367 Activity, basketball: Secondary | ICD-10-CM | POA: Insufficient documentation

## 2017-01-29 DIAGNOSIS — W500XXA Accidental hit or strike by another person, initial encounter: Secondary | ICD-10-CM | POA: Diagnosis not present

## 2017-01-29 DIAGNOSIS — Y999 Unspecified external cause status: Secondary | ICD-10-CM | POA: Diagnosis not present

## 2017-01-29 DIAGNOSIS — Z79899 Other long term (current) drug therapy: Secondary | ICD-10-CM | POA: Insufficient documentation

## 2017-01-29 DIAGNOSIS — Y929 Unspecified place or not applicable: Secondary | ICD-10-CM | POA: Diagnosis not present

## 2017-01-29 DIAGNOSIS — F17210 Nicotine dependence, cigarettes, uncomplicated: Secondary | ICD-10-CM | POA: Insufficient documentation

## 2017-01-29 DIAGNOSIS — M25522 Pain in left elbow: Secondary | ICD-10-CM | POA: Diagnosis not present

## 2017-01-29 NOTE — ED Triage Notes (Signed)
Pt complaining of L elbow pain. Pt states playing basketball, ran into another player. Pt denies any fall or trauma.

## 2017-01-29 NOTE — ED Provider Notes (Signed)
[facility]-EMERGENCY DEPT Provider Note   CSN: [id number] Arrival date & time: 01/29/17  2325  By signing my name below, I, Modena JanskyAlbert Thayil, attest that this documentation has been prepared under the direction and in the presence of Tomasita CrumbleAdeleke Arnita Koons, MD. Electronically Signed: Modena JanskyAlbert Thayil, Scribe. 01/29/2017. 11:49 PM.  History   Chief Complaint Chief Complaint  Patient presents with  . Arm Pain   The history is provided by the patient. No language interpreter was used.   HPI Comments: Cole Barrett is a 31 y.o. male who presents to the Emergency Department complaining of constant moderate left elbow pain that started today. He states he "hit his elbow on something while playing basketball". His pain is exacerbated by movement. He reports associated swelling at injury site. He denies any head injury, LOC, or other complaints.   Past Medical History:  Diagnosis Date  . Homelessness   . Hypercholesterolemia   . Mental disorder   . Paranoid behavior (HCC)   . PE (pulmonary embolism)   . Schizophrenia [facility](HCC)     Patient Active Problem List   Diagnosis Date Noted  . Alcohol abuse   . Auditory hallucinations   . Alcohol abuse with alcohol-induced mood disorder (HCC) 09/08/2016  . Adjustment disorder with depressed mood 09/07/2016  . Homelessness 09/03/2016  . Person feigning illness 09/03/2016  . 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 Cannabis use disorder, mild, abuse 08/29/2016  . Paranoid schizophrenia (HCC) 08/26/2016  . Pulmonary emboli (HCC) 07/13/2016  . 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 Acute left flank pain 01/02/2014  . Pulmonary embolism, bilateral (HCC) 01/02/2014    History reviewed. No pertinent surgical history.     Home Medications    Prior to Admission medications   Medication Sig Start Date End Date Taking? Authorizing Provider  benztropine (COGENTIN) 1 MG tablet Take 1 tablet (1 mg total) by mouth at bedtime. 01/24/17   Gwyneth SproutWhitney  Plunkett, MD  FLUoxetine (PROZAC) 20 MG tablet Take 1 tablet (20 mg total) by mouth daily. Patient not taking: Reported on 01/22/2017 01/22/17   Charm RingsJamison Y Lord, NP  risperiDONE (RISPERDAL) 3 MG tablet Take 1 tablet (3 mg total) by mouth at bedtime. 01/24/17   Gwyneth SproutWhitney Plunkett, MD    Family History Family History  Problem Relation Age of Onset  . Hypertension Mother     Social History Social History  Substance Use Topics  . Smoking status: Current Every Day Smoker    Packs/day: 0.50    Types: Cigarettes  . Smokeless tobacco: Never Used  . Alcohol use Yes     Allergies   Haldol [haloperidol]   Review of Systems Review of Systems A complete 10 system review of systems was obtained and all systems are negative except as noted in the HPI and PMH.   Physical Exam Updated Vital Signs BP 142/76   Pulse 66   Temp 97.7 F (36.5 C) (Oral)   Resp 16   SpO2 100%   Physical Exam  Constitutional: He is oriented to person, place, and time. Vital signs are normal. He appears well-developed and well-nourished.  Non-toxic appearance. He does not appear ill. No distress.  HENT:  Head: Normocephalic and atraumatic.  Nose: Nose normal.  Mouth/Throat: Oropharynx is clear and moist. No oropharyngeal exudate.  Eyes: Conjunctivae and EOM are normal. Pupils are equal, round, and reactive to light. No scleral icterus.  Neck: Normal range of motion. Neck supple. No tracheal deviation, no edema, no erythema and normal range of  motion present. No thyroid mass and no thyromegaly present.  Cardiovascular: Normal rate, regular rhythm, S1 normal, S2 normal, normal heart sounds, intact distal pulses and normal pulses.  Exam reveals no gallop and no friction rub.   No murmur heard. Pulmonary/Chest: Effort normal and breath sounds normal. No respiratory distress. He has no wheezes. He has no rhonchi. He has no rales.  Abdominal: Soft. Normal appearance and bowel sounds are normal. He exhibits no  distension, no ascites and no mass. There is no hepatosplenomegaly. There is no tenderness. There is no rebound, no guarding and no CVA tenderness.  Musculoskeletal: Normal range of motion. He exhibits tenderness. He exhibits no edema.  Mild TTP to the left lateral elbow. Pain with active ROM of left elbow.   Lymphadenopathy:    He has no cervical adenopathy.  Neurological: He is alert and oriented to person, place, and time. He has normal strength. No cranial nerve deficit or sensory deficit.  Skin: Skin is warm, dry and intact. No petechiae and no rash noted. He is not diaphoretic. No erythema. No pallor.  Nursing note and vitals reviewed.    ED Treatments / Results  DIAGNOSTIC STUDIES: Oxygen Saturation is 100% on RA, normal by my interpretation.    COORDINATION OF CARE: 11:53 PM- Pt advised of plan for treatment and pt agrees.  Labs (all labs ordered are listed, but only abnormal results are displayed) Labs Reviewed - No data to display  EKG  EKG Interpretation None       Radiology Dg Elbow Complete Left (3+view)  Result Date: 01/30/2017 CLINICAL DATA:  Elbow pain after playing basketball. EXAM: LEFT ELBOW - COMPLETE 3+ VIEW COMPARISON:  None. FINDINGS: There is no evidence of fracture, dislocation, or joint effusion. There is no evidence of arthropathy or other focal bone abnormality. Soft tissues are unremarkable. IMPRESSION: No fracture or dislocation of the left elbow. Electronically Signed   By: Deatra Robinson M.D.   On: 01/30/2017 00:37    Procedures Procedures (including critical care time)  Medications Ordered in ED Medications - No data to display   Initial Impression / Assessment and Plan / ED Course  I have reviewed the triage vital signs and the nursing notes.  Pertinent labs & imaging results that were available during my care of the patient were reviewed by me and considered in my medical decision making (see chart for details).     Patient presents to  the ED for for elbow pain after playing basketball.  Physical exam shows som tenderness but xray is normal. Advised on tylenol or ibuprofen as needed.  Ice packs for swelling.  PCP fu provided. He appears well and in NAD. Vs remain within his normal limits and he is safe for DC.     Final Clinical Impressions(s) / ED Diagnoses   Final diagnoses:  Left elbow pain    New Prescriptions New Prescriptions   No medications on file     I personally performed the services described in this documentation, which was scribed in my presence. The recorded information has been reviewed and is accurate.       Tomasita Crumble, MD 01/30/17 [phone]

## 2017-01-30 ENCOUNTER — Emergency Department (HOSPITAL_COMMUNITY): Payer: Medicaid Other

## 2017-02-11 ENCOUNTER — Emergency Department (HOSPITAL_COMMUNITY)
Admission: EM | Admit: 2017-02-11 | Discharge: 2017-02-11 | Disposition: A | Discharge status: Home / Self Care | Payer: Medicaid Other | Attending: Emergency Medicine | Admitting: Emergency Medicine

## 2017-02-11 DIAGNOSIS — Z79899 Other long term (current) drug therapy: Secondary | ICD-10-CM | POA: Insufficient documentation

## 2017-02-11 DIAGNOSIS — R6884 Jaw pain: Secondary | ICD-10-CM

## 2017-02-11 DIAGNOSIS — F17210 Nicotine dependence, cigarettes, uncomplicated: Secondary | ICD-10-CM | POA: Insufficient documentation

## 2017-02-11 LAB — CBC
HEMATOCRIT: 36.1 % — AB (ref 39.0–52.0)
Hemoglobin: 11.9 g/dL — ABNORMAL LOW (ref 13.0–17.0)
MCH: 27 pg (ref 26.0–34.0)
MCHC: 33 g/dL (ref 30.0–36.0)
MCV: 81.9 fL (ref 78.0–100.0)
PLATELETS: 293 10*3/uL (ref 150–400)
RBC: 4.41 MIL/uL (ref 4.22–5.81)
RDW: 13.5 % (ref 11.5–15.5)
WBC: 4.7 10*3/uL (ref 4.0–10.5)

## 2017-02-11 LAB — I-STAT CHEM 8, ED
BUN: 9 mg/dL (ref 6–20)
CHLORIDE: 102 mmol/L (ref 101–111)
CREATININE: 1.1 mg/dL (ref 0.61–1.24)
Calcium, Ion: 1.14 mmol/L — ABNORMAL LOW (ref 1.15–1.40)
GLUCOSE: 105 mg/dL — AB (ref 65–99)
HCT: 38 % — ABNORMAL LOW (ref 39.0–52.0)
Hemoglobin: 12.9 g/dL — ABNORMAL LOW (ref 13.0–17.0)
POTASSIUM: 3.6 mmol/L (ref 3.5–5.1)
Sodium: 139 mmol/L (ref 135–145)
TCO2: 28 mmol/L (ref 0–100)

## 2017-02-11 MED ORDER — IBUPROFEN 200 MG PO TABS
600.0000 mg | ORAL_TABLET | Freq: Three times a day (TID) | ORAL | Status: DC | PRN
  Administered 2017-02-11 (×2): 600 mg via ORAL
  Filled 2017-02-11 (×2): qty 3

## 2017-02-11 MED ORDER — DIAZEPAM 5 MG PO TABS
5.0000 mg | ORAL_TABLET | Freq: Once | ORAL | Status: AC
  Administered 2017-02-11: 5 mg via ORAL
  Filled 2017-02-11: qty 1

## 2017-02-11 MED ORDER — LORAZEPAM 1 MG PO TABS: 1.0000 mg | ORAL_TABLET | Freq: Three times a day (TID) | ORAL | Status: DC | PRN

## 2017-02-11 MED ORDER — FLUOXETINE HCL 20 MG PO CAPS
20.0000 mg | ORAL_CAPSULE | Freq: Every day | ORAL | Status: DC
  Administered 2017-02-11: 20 mg via ORAL
  Filled 2017-02-11: qty 1

## 2017-02-11 MED ORDER — BENZTROPINE MESYLATE 1 MG PO TABS: 1.0000 mg | ORAL_TABLET | Freq: Every day | ORAL | Status: DC

## 2017-02-11 NOTE — ED Triage Notes (Signed)
[place] [facility] [place] [facility] [place]/ c/o 8/10 jaw pain x1 week. Pt arrives A+OX4, speaking in complete sentences, ambulatory.   EMS Vitals BP 136/74 P 72 RR 16

## 2017-02-11 NOTE — ED Notes (Signed)
Bed: [facility]C Expected date:  Expected time:  Means of arrival:  Comments: No bed.  

## 2017-02-11 NOTE — ED Notes (Signed)
Bed: WA27 Expected date:  Expected time:  Means of arrival:  Comments: Hall C 

## 2017-02-11 NOTE — ED Notes (Signed)
Patient changed into purple scrubs/yellow socks. Patient wanded by security. Patient 2 bags of belongings placed in locker #27.

## 2017-02-11 NOTE — [facility] Suicide Risk Assessment (Cosign Needed)
Suicide Risk Assessment  Discharge Assessment   [facility]   Principal Problem: <principal problem not specified> Discharge Diagnoses:  Patient Active Problem List   Diagnosis Date Noted  . Alcohol abuse [F10.10]   . Auditory hallucinations [R44.0]   . Alcohol abuse with alcohol-induced mood disorder (HCC) [F10.14] 09/08/2016  . Adjustment disorder with depressed mood [F43.21] 09/07/2016  . Homelessness [Z59.0] 09/03/2016  . Person feigning illness [Z76.5] 09/03/2016  . 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 Cannabis use disorder, mild, abuse [F12.10] 08/29/2016  . Paranoid schizophrenia (HCC) [F20.0] 08/26/2016  . Pulmonary emboli (HCC) [I26.99] 07/13/2016  . Pulmonary embolism and infarction (HCC) [I26.99] 01/02/2014  . Acute left flank pain [R10.9] 01/02/2014  . Pulmonary embolism, bilateral (HCC) [I26.99] 01/02/2014    Total Time spent with patient: 30 minutes  Musculoskeletal: Strength & Muscle Tone: within normal limits Gait & Station: normal Patient leans: N/A  Psychiatric Specialty Exam:   Blood pressure (!) 146/89, pulse 68, temperature 98.3 F (36.8 C), temperature source Oral, resp. rate 16, SpO2 100 %.There is no height or weight on file to calculate BMI.  General Appearance: Fairly Groomed  Patent attorneyye Contact::  Fair  Speech:  Normal Rate409  Volume:  Normal  Mood:  Depressed  Affect:  Congruent  Thought Process:  Coherent and Descriptions of Associations: Intact  Orientation:  Full (Time, Place, and Person)  Thought Content:  WDL  Suicidal Thoughts:  No  Homicidal Thoughts:  No  Memory:  Immediate;   Good Recent;   Good Remote;   Good  Judgement:  Fair  Insight:  Fair  Psychomotor Activity:  Normal  Concentration:  Fair  Recall:  Fair  Fund of Knowledge:Fair  Language: Good  Akathisia:  No  Handed:  Right  AIMS (if indicated):     Assets:  Leisure Time Physical Health Resilience  Sleep:     Cognition: WNL  ADL's:   Intact   Mental Status Per Nursing Assessment::   On Admission:     Demographic Factors:  Male  Loss Factors: NA  Historical Factors: NA  Risk Reduction Factors:   Sense of responsibility to family and Positive therapeutic relationship  Continued Clinical Symptoms:  Depression, mild  Cognitive Features That Contribute To Risk:  None    Suicide Risk:  Minimal: No identifiable suicidal ideation.  Patients presenting with no risk factors but with morbid ruminations; may be classified as minimal risk based on the severity of the depressive symptoms    Plan Of Care/Follow-up recommendations:  Activity:  As tolerated Diet:  Heart healthy diet  Cole Gorelick, NP 02/11/2017, 2:23 PM

## 2017-02-11 NOTE — ED Notes (Signed)
Patient going home by bus.

## 2017-02-11 NOTE — BH Assessment (Addendum)
Tele Assessment Note   Cole Barrett is an 31 y.o. male [place] pain.  [name] denies current SI/HI and only reports stressors from his jaw pain.  Cole Barrett has an extensive hx of reporting to the ED with complaints of AH and voices telling him to kill himself and a few times kill others. [name] denies hearing voices during this encounter and was not forthcoming about his hx based on what is documented in his charts.  Kelten denies having a current therapist and psychiatrist although Cole Barrett was documented as the provider in his chart.  [name] reports he lives alone and does not have any supports. [name] reports he likes to look at TV and works through his own problems.    [name] denies having any legal issues or upcoming court dates.  He denies having problems with anger and does not endorse any violent behaviors.  [name] was forthcoming about using marijuana, cigarettes and consumption of alcohol.  He appears to be minimizing his use and denies having a problem.  [name] stated he does not desire to quit and does not report any negative consequences from it's use.  [name] has an extensive history of ED visits dating back to 03/12/2014.  He has made several visits to the ED this years mainly due to active hallucinations.  Cole Barrett presented with a body odor and appeared dishelved. Cole Barrett had poor eye contact,  and his motor activity appeared hypoactive.  His speech was unremarkable, and his level of consciousness was altered due to [place] appearing very sleepy.  Cole Barrett mood appeared as if he was in a lot of pain and his affect appeared flat throughout the interview.  He thought process appeared thought blocking as evidenced by Cole Barrett starting a sentence and stopping in the middle of his sentence.  He was oriented to person, place, time, and situation.  Overall, considering his  pain he was very cooperative.    Based on the presenting information Cole Barrett recommended an AM Psych Evaluation.  Dr. Rhunette Barrett was informed of the consult and recommendation of Cole Conn and concurred with the recommendation.  Diagnosis: Schizophrenia by history, Alcohol Abuse Disorder, Cannabis Abuse Disorder, and Tobacco Abuse Disorder  Past Medical History:  Past Medical History:  Diagnosis Date  . Homelessness   . Hypercholesterolemia   . Mental disorder   . Paranoid behavior (HCC)   . PE (pulmonary embolism)   . Schizophrenia (HCC)     No past surgical history on file.  Family History:  Family History  Problem Relation Age of Onset  . Hypertension Mother     Social History:  reports that he has been smoking Cigarettes.  He has been smoking about 0.50 packs per day. He has never used smokeless tobacco. He reports that he drinks alcohol. He reports that he uses drugs, including Marijuana.  Additional Social History:  Alcohol / Drug Use Pain Medications: See MAR Prescriptions: See MAR Over the Counter: pt denies Substance #1 Name of Substance 1: Alcohol 1 - Age of First Use: 18 1 - Amount (size/oz): 2 beers 1 - Frequency: 2 x week 1 - Duration: 13 years 1 - Last Use / Amount: 02/09/17 Substance #2 Name of Substance 2: Cannabis 2 - Age of First Use: 18 2 - Amount (size/oz): 1 blunt 2 - Frequency: 1 x monthly 2 - Duration: 13 years 2 - Last Use / Amount: about three weeks ago Substance #3 Name of Substance  3: Cigarettes 3 - Age of First Use: 18 3 - Amount (size/oz): 2 cigarettes  3 - Frequency: per day 3 - Duration: 13 years 3 - Last Use / Amount: 02/10/2017  CIWA: CIWA-Ar BP: 138/72 Pulse Rate: 64 COWS:    PATIENT STRENGTHS: (choose at least two) Average or above average intelligence  Allergies:  Allergies  Allergen Reactions  . Haldol [Haloperidol] Shortness Of Breath    Home Medications:  (Not in a hospital admission)  OB/GYN Status:   No LMP for male patient.  General Assessment Data [facility]: [facility] Assessment: In system Is this a Tele or Face-to-Face Assessment?: Face-to-Face Is this an Initial Assessment or a Re-assessment for this encounter?: Initial Assessment Marital status: Single Maiden name:  (N/A) Is patient pregnant?: No Pregnancy Status: No Living Arrangements: Alone Can pt return to current living arrangement?: Yes Admission Status: Voluntary Is patient capable of signing voluntary admission?: Yes Referral Source: Self/Family/Friend     Crisis Care Plan Living Arrangements: Alone Legal Guardian: Other: (self) Name of Psychiatrist:  Vesta Barrett([facility] is documented from previous assessment on 01/23/17) Name of Therapist:  Vesta Barrett([facility] is documented on previous assessment from 01/23/17)  Education Status Is patient currently in school?: No Highest grade of school patient has completed:  (12th) Name of school:  (Not reported) Contact person:  (None noted)  Risk to self with the past 6 months Suicidal Ideation: No Has patient been a risk to self within the past 6 months prior to admission? : Yes Suicidal Intent: No Has patient had any suicidal intent within the past 6 months prior to admission? : No Is patient at risk for suicide?: No Suicidal Plan?: No Has patient had any suicidal plan within the past 6 months prior to admission? : No Access to Means: No What has been your use of drugs/alcohol within the last 12 months?:  (Alocohol and Marijuana) Previous Attempts/Gestures: No How many times?:  (0) Other Self Harm Risks:  (None reported) Triggers for Past Attempts: Unknown Intentional Self Injurious Behavior: None Family Suicide History: Unknown Recent stressful life event(s): Other (Comment) (Jaw pain) Persecutory voices/beliefs?: No Depression: No Depression Symptoms:  (Pt. denies having any symptoms) Substance abuse history and/or treatment for substance abuse?: Yes Suicide  prevention information given to non-admitted patients: Not applicable  Risk to Others within the past 6 months Homicidal Ideation: No-Not Currently/Within Last 6 Months Does patient have any lifetime risk of violence toward others beyond the six months prior to admission? : No Thoughts of Harm to Others: No-Not Currently Present/Within Last 6 Months (It is documented in pt chart on 01/25/17 he had HI) Current Homicidal Plan: No Access to Homicidal Means: No Identified Victim:  (None noted) History of harm to others?: No Assessment of Violence: None Noted Violent Behavior Description:  (Unknown) Does patient have access to weapons?: No Criminal Charges Pending?: No Describe Pending Criminal Charges:  (None) Does patient have a court date: No Is patient on probation?: No  Psychosis Hallucinations: None noted Delusions: None noted  Mental Status Report Appearance/Hygiene: Body odor, Disheveled Eye Contact: Poor Motor Activity: Hyperactivity Speech: Unremarkable Level of Consciousness: Sleeping, Other (Comment) Mood: Other (Comment) (Client expressed being in pain of 8 on scale 1-10) Affect: Flat, Other (Comment) (Painful) Anxiety Level: None Thought Processes: Thought Blocking Judgement: Impaired Orientation: Person, Place, Time, Situation Obsessive Compulsive Thoughts/Behaviors: None  Cognitive Functioning Concentration: Fair Memory: Recent Intact IQ: Average Insight: Fair Impulse Control: Good Appetite: Good Weight Loss:  (0) Weight Gain:  (  0) Sleep: No Change Total Hours of Sleep:  (8) Vegetative Symptoms: None  ADLScreening [facility] Assessment Services) Patient's cognitive ability adequate to safely complete daily activities?: Yes Patient able to express need for assistance with ADLs?: Yes Independently performs ADLs?: Yes (appropriate for developmental age)  Prior Inpatient Therapy Prior Inpatient Therapy: Yes Prior Therapy Dates:  (Documented in hx pt received  inpatient services 2016, 2017) Prior Therapy Facilty/Provider(s):  [facility]) Reason for Treatment:  (Documented pt was seen for schizophrenia)  Prior Outpatient Therapy Prior Outpatient Therapy: Yes Prior Therapy Dates:  (Documented in chart pt received tx 2017) Prior Therapy Facilty/Provider(s):  (Documented pt received services from [facility]) Reason for Treatment:  (documented in pt records med mngmt.) Does patient have an ACCT team?: No Does patient have Intensive In-House Services?  : No Does patient have [facility] services? : No  ADL Screening (condition at time of admission) Patient's cognitive ability adequate to safely complete daily activities?: Yes Patient able to express need for assistance with ADLs?: Yes Independently performs ADLs?: Yes (appropriate for developmental age)       Abuse/Neglect Assessment (Assessment to be complete while patient is alone) Physical Abuse: Denies Verbal Abuse: Denies Sexual Abuse: Denies Exploitation of patient/patient's resources: Denies Self-Neglect: Denies Values / Beliefs Cultural Requests During Hospitalization: None Spiritual Requests During Hospitalization: None Consults Spiritual Care Consult Needed: No Social Work Consult Needed: No Merchant navy officer (For Healthcare) Does Patient Have a Medical Advance Directive?: No    Additional Information 1:1 In Past 12 Months?: Yes CIRT Risk: No Elopement Risk: No Does patient have medical clearance?: Yes     Disposition: Am Psych Eval per Cole Barrett Disposition Initial Assessment Completed for this Encounter: Yes Disposition of Patient:  (Consulted with Cole Barrett and recommends AM Psyche) Other disposition(s): Other (Comment) (Informed Dr. Rhunette Barrett of [facility] recommendation.)  [facility] 02/11/2017 6:29 AM

## 2017-02-11 NOTE — ED Provider Notes (Signed)
[facility]-EMERGENCY DEPT Provider Note   CSN: [id number] Arrival date & time: 02/11/17  0038   By signing my name below, I, [name], attest that this documentation has been prepared under the direction and in the presence of Derwood Kaplan, MD  Electronically Signed: [name], ED Scribe. 02/11/17. 3:51 AM.   History   Chief Complaint Chief Complaint  Patient presents with  . Jaw Pain    The history is provided by the patient. No language interpreter was used.   HPI Comments:  Cole Barrett is a 31 y.o. male who presents to the Emergency Department complaining of acute onset, persistent, moderate bilateral jaw pain which began 1 week ago. He also reports bilateral dental pain. No alleviating factors noted. Pt denies any recent falls, any recent facial trauma or any other associated symptoms. No other complaints noted.  Pt has no hx of dental infection.  Past Medical History:  Diagnosis Date  . Homelessness   . Hypercholesterolemia   . Mental disorder   . Paranoid behavior (HCC)   . PE (pulmonary embolism)   . Schizophrenia [facility])     Patient Active Problem List   Diagnosis Date Noted  . Alcohol abuse   . Auditory hallucinations   . Alcohol abuse with alcohol-induced mood disorder (HCC) 09/08/2016  . Adjustment disorder with depressed mood 09/07/2016  . Homelessness 09/03/2016  . Person feigning illness 09/03/2016  . 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 Cannabis use disorder, mild, abuse 08/29/2016  . Paranoid schizophrenia (HCC) 08/26/2016  . Pulmonary emboli (HCC) 07/13/2016  . 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 Acute left flank pain 01/02/2014  . Pulmonary embolism, bilateral (HCC) 01/02/2014    No past surgical history on file.     Home Medications    Prior to Admission medications   Medication Sig Start Date End Date Taking? Authorizing Provider  benztropine (COGENTIN) 1 MG tablet Take 1 tablet (1 mg total) by mouth at  bedtime. 01/24/17   Gwyneth Sprout, MD  FLUoxetine (PROZAC) 20 MG tablet Take 1 tablet (20 mg total) by mouth daily. Patient not taking: Reported on 01/22/2017 01/22/17   Charm Rings, NP  risperiDONE (RISPERDAL) 3 MG tablet Take 1 tablet (3 mg total) by mouth at bedtime. 01/24/17   Gwyneth Sprout, MD    Family History Family History  Problem Relation Age of Onset  . Hypertension Mother     Social History Social History  Substance Use Topics  . Smoking status: Current Every Day Smoker    Packs/day: 0.50    Types: Cigarettes  . Smokeless tobacco: Never Used  . Alcohol use Yes     Allergies   Haldol [haloperidol]   Review of Systems Review of Systems  Constitutional: Negative for fever.  HENT: Positive for dental problem and facial swelling.   Musculoskeletal: Positive for myalgias.   Physical Exam Updated Vital Signs BP 113/77 (BP Location: Left Arm)   Pulse 72   Temp 98.2 F (36.8 C) (Oral)   Resp 16   SpO2 99%   Physical Exam  Constitutional: He is oriented to person, place, and time. He appears well-developed and well-nourished.  HENT:  Head: Normocephalic.  Pt has tenderness to palpation of all his teeth and around the gingiva. There is also tenderness to palpation of the TMJ. No abscess / infection signs seen.  Eyes: EOM are normal.  Neck: Normal range of motion.  Pulmonary/Chest: Effort normal.  Abdominal: He exhibits no distension.  Musculoskeletal: Normal  range of motion.  Neurological: He is alert and oriented to person, place, and time.  Psychiatric: He has a normal mood and affect.  Nursing note and vitals reviewed.   ED Treatments / Results  DIAGNOSTIC STUDIES:  Oxygen Saturation is 99% on RA, normal by my interpretation.    COORDINATION OF CARE:  3:49 AM Discussed treatment plan with pt at bedside and pt agreed to plan.  Labs (all labs ordered are listed, but only abnormal results are displayed) Labs Reviewed  CBC  I-STAT CHEM 8,  ED    EKG  EKG Interpretation  Date/Time:  Saturday February 11 2017 00:51:38 EDT Ventricular Rate:  83 PR Interval:    QRS Duration: 90 QT Interval:  352 QTC Calculation: 414 R Axis:   66 Text Interpretation:  Sinus rhythm Consider left atrial enlargement ST elev, probable normal early repol pattern Nonspecific ST and T wave abnormality - new No acute changes Confirmed by Rhunette CroftNANAVATI, MD, Janey GentaANKIT [phone]) on 02/11/2017 3:34:58 AM       Radiology No results found.  Procedures Procedures (including critical care time)  Medications Ordered in ED Medications  diazepam (VALIUM) tablet 5 mg (not administered)  ibuprofen (ADVIL,MOTRIN) tablet 600 mg (not administered)  LORazepam (ATIVAN) tablet 1 mg (not administered)     Initial Impression / Assessment and Plan / ED Course  I have reviewed the triage vital signs and the nursing notes.  Pertinent labs & imaging results that were available during my care of the patient were reviewed by me and considered in my medical decision making (see chart for details).    Pt has jaw pain. I dont see any clear evidence of infection.  Pt has TMJ pain, and her teeth hurts diffusely. 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 Pt is already on cogentin....  We will get psych to assess. If they dont think it's dystonia, then we will treat him with pen v k and NSAIDs.   Final Clinical Impressions(s) / ED Diagnoses   Final diagnoses:  Jaw pain    New Prescriptions New Prescriptions   No medications on file  I personally performed the services described in this documentation, which was scribed in my presence. The recorded information has been reviewed and is accurate.    Derwood KaplanAnkit Makana Rostad, MD 02/11/17 [phone]

## 2017-02-28 ENCOUNTER — Encounter (HOSPITAL_COMMUNITY): Payer: Self-pay

## 2017-02-28 DIAGNOSIS — Z79899 Other long term (current) drug therapy: Secondary | ICD-10-CM | POA: Insufficient documentation

## 2017-02-28 DIAGNOSIS — R440 Auditory hallucinations: Secondary | ICD-10-CM | POA: Diagnosis not present

## 2017-02-28 DIAGNOSIS — F17210 Nicotine dependence, cigarettes, uncomplicated: Secondary | ICD-10-CM | POA: Insufficient documentation

## 2017-02-28 DIAGNOSIS — R42 Dizziness and giddiness: Secondary | ICD-10-CM | POA: Diagnosis present

## 2017-02-28 NOTE — ED Triage Notes (Signed)
Pt complains of hearing voices that are trying to kill him, pt denies SI/HI He also states that he's been without his medications for several weeks Pt also complains of being dizzy tonight

## 2017-03-01 ENCOUNTER — Emergency Department (HOSPITAL_COMMUNITY)
Admission: EM | Admit: 2017-03-01 | Discharge: 2017-03-01 | Disposition: A | Discharge status: Home / Self Care | Payer: Medicaid Other | Attending: Emergency Medicine | Admitting: Emergency Medicine

## 2017-03-01 DIAGNOSIS — R440 Auditory hallucinations: Secondary | ICD-10-CM

## 2017-03-01 MED ORDER — RISPERIDONE 3 MG PO TABS: 3.0000 mg | ORAL_TABLET | Freq: Every day | ORAL | 0 refills | Status: DC

## 2017-03-01 MED ORDER — RISPERIDONE 2 MG PO TABS
3.0000 mg | ORAL_TABLET | ORAL | Status: AC
  Administered 2017-03-01: 3 mg via ORAL
  Filled 2017-03-01: qty 1

## 2017-03-01 MED ORDER — FLUOXETINE HCL 20 MG PO TABS: 20.0000 mg | ORAL_TABLET | Freq: Every day | ORAL | 0 refills | Status: DC

## 2017-03-01 MED ORDER — BENZTROPINE MESYLATE 1 MG PO TABS
1.0000 mg | ORAL_TABLET | ORAL | Status: AC
  Administered 2017-03-01: 1 mg via ORAL
  Filled 2017-03-01: qty 1

## 2017-03-01 MED ORDER — BENZTROPINE MESYLATE 1 MG PO TABS: 1.0000 mg | ORAL_TABLET | Freq: Every day | ORAL | 0 refills | Status: DC

## 2017-03-01 NOTE — ED Provider Notes (Signed)
[facility] Provider Note   CSN: [id number] Arrival date & time: 02/28/17  2222   By signing my name below, I, Cole Barrett, attest that this documentation has been prepared under the direction and in the presence of Cole Crease, MD  Electronically Signed: Clovis Barrett, ED Scribe. 03/01/17. 2:07 AM.   History   Chief Complaint Chief Complaint  Patient presents with  . Dizziness    HPI Comments:  Cole Barrett is a 31 y.o. male, with a PMHx of schizophrenia and paranoid behavior, who presents to the Emergency Department complaining of ongoing auditory hallucinations x today. Pt states the voices are loud which state they are going to kill him. He also reports dizziness x today. Pt is non-compliant with his medications. No alleviating or modifying factors noted. Pt denies any other associated symptoms. No other complaints noted.    The history is provided by the patient. No language interpreter was used.    Past Medical History:  Diagnosis Date  . Homelessness   . Hypercholesterolemia   . Mental disorder   . Paranoid behavior (HCC)   . PE (pulmonary embolism)   . Schizophrenia [facility])     Patient Active Problem List   Diagnosis Date Noted  . Alcohol abuse   . Auditory hallucinations   . Alcohol abuse with alcohol-induced mood disorder (HCC) 09/08/2016  . Adjustment disorder with depressed mood 09/07/2016  . Homelessness 09/03/2016  . Person feigning illness 09/03/2016  . 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 Cannabis use disorder, mild, abuse 08/29/2016  . Paranoid schizophrenia (HCC) 08/26/2016  . Pulmonary emboli (HCC) 07/13/2016  . 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 Acute left flank pain 01/02/2014  . Pulmonary embolism, bilateral (HCC) 01/02/2014    History reviewed. No pertinent surgical history.     Home Medications    Prior to Admission medications   Medication Sig Start Date End Date Taking? Authorizing  Provider  benztropine (COGENTIN) 1 MG tablet Take 1 tablet (1 mg total) by mouth at bedtime. 03/01/17   Cole Crease, MD  FLUoxetine (PROZAC) 20 MG tablet Take 1 tablet (20 mg total) by mouth daily. 03/01/17   Cole Crease, MD  risperiDONE (RISPERDAL) 3 MG tablet Take 1 tablet (3 mg total) by mouth at bedtime. 03/01/17   Cole Crease, MD    Family History Family History  Problem Relation Age of Onset  . Hypertension Mother     Social History Social History  Substance Use Topics  . Smoking status: Current Every Day Smoker    Packs/day: 0.50    Types: Cigarettes  . Smokeless tobacco: Never Used  . Alcohol use Yes     Allergies   Haldol [haloperidol]   Review of Systems Review of Systems  Neurological: Positive for dizziness.  Psychiatric/Behavioral: Positive for hallucinations.  All other systems reviewed and are negative.  Physical Exam Updated Vital Signs BP (!) 141/76 (BP Location: Left Arm)   Pulse 81   Resp 20   SpO2 99%   Physical Exam  Constitutional: He is oriented to person, place, and time. He appears well-developed and well-nourished. No distress.  HENT:  Head: Normocephalic and atraumatic.  Right Ear: Hearing normal.  Left Ear: Hearing normal.  Nose: Nose normal.  Mouth/Throat: Oropharynx is clear and moist and mucous membranes are normal.  Eyes: Conjunctivae and EOM are normal. Pupils are equal, round, and reactive to light.  Neck: Normal range of motion. Neck supple.  Cardiovascular: Regular rhythm,  S1 normal and S2 normal.  Exam reveals no gallop and no friction rub.   No murmur heard. Pulmonary/Chest: Effort normal and breath sounds normal. No respiratory distress. He exhibits no tenderness.  Abdominal: Soft. Normal appearance and bowel sounds are normal. There is no hepatosplenomegaly. There is no tenderness. There is no rebound, no guarding, no tenderness at McBurney's point and negative Murphy's sign. No hernia.    Musculoskeletal: Normal range of motion.  Neurological: He is alert and oriented to person, place, and time. He has normal strength. No cranial nerve deficit or sensory deficit. Coordination normal. GCS eye subscore is 4. GCS verbal subscore is 5. GCS motor subscore is 6.  Skin: Skin is warm, dry and intact. No rash noted. No cyanosis.  Psychiatric: He has a normal mood and affect. His speech is normal and behavior is normal. Thought content normal.  Nursing note and vitals reviewed.  ED Treatments / Results  DIAGNOSTIC STUDIES:  Oxygen Saturation is 99% on RA, normal by my interpretation.    COORDINATION OF CARE:  1:59 AM Discussed treatment plan with pt at bedside and pt agreed to plan.  Labs (all labs ordered are listed, but only abnormal results are displayed) Labs Reviewed - No data to display  EKG  EKG Interpretation None       Radiology No results found.  Procedures Procedures (including critical care time)  Medications Ordered in ED Medications  risperiDONE (RISPERDAL) tablet 3 mg (3 mg Oral Given 03/01/17 0211)  benztropine (COGENTIN) tablet 1 mg (1 mg Oral Given 03/01/17 0211)     Initial Impression / Assessment and Plan / ED Course  I have reviewed the triage vital signs and the nursing notes.  Pertinent labs & imaging results that were available during my care of the patient were reviewed by me and considered in my medical decision making (see chart for details).     Patient with history of schizophrenia and homelessness presents to the emergency department with complaints of increased voices. He does report that the voices are telling him that they are going to kill him, but he does not feel unsafe. He is not homicidal or suicidal. He did start to feel dizzy tonight. His examination, however, is nonfocal. Patient has been off of his medications. Will reinstate his Risperdal, does not require psychiatric evaluation at this time.  Final Clinical Impressions(s)  / ED Diagnoses   Final diagnoses:  Auditory hallucination    New Prescriptions Current Discharge Medication List    I personally performed the services described in this documentation, which was scribed in my presence. The recorded information has been reviewed and is accurate.     Cole Crease, MD 03/01/17 [phone]

## 2017-03-01 NOTE — ED Notes (Signed)
Bed: [facility] Expected date:  Expected time:  Means of arrival:  Comments: 

## 2017-03-22 ENCOUNTER — Emergency Department (HOSPITAL_COMMUNITY)
Admission: EM | Admit: 2017-03-22 | Discharge: 2017-03-22 | Disposition: A | Discharge status: Home / Self Care | Payer: Medicaid Other | Attending: Emergency Medicine | Admitting: Emergency Medicine

## 2017-03-22 DIAGNOSIS — F200 Paranoid schizophrenia: Secondary | ICD-10-CM

## 2017-03-22 DIAGNOSIS — I2699 Other pulmonary embolism without acute cor pulmonale: Secondary | ICD-10-CM | POA: Diagnosis not present

## 2017-03-22 DIAGNOSIS — F17210 Nicotine dependence, cigarettes, uncomplicated: Secondary | ICD-10-CM | POA: Diagnosis not present

## 2017-03-22 DIAGNOSIS — Z79899 Other long term (current) drug therapy: Secondary | ICD-10-CM | POA: Insufficient documentation

## 2017-03-22 DIAGNOSIS — R440 Auditory hallucinations: Secondary | ICD-10-CM

## 2017-03-22 LAB — CBC
HEMATOCRIT: 41 % (ref 39.0–52.0)
Hemoglobin: 13.6 g/dL (ref 13.0–17.0)
MCH: 28.1 pg (ref 26.0–34.0)
MCHC: 33.2 g/dL (ref 30.0–36.0)
MCV: 84.7 fL (ref 78.0–100.0)
Platelets: 293 10*3/uL (ref 150–400)
RBC: 4.84 MIL/uL (ref 4.22–5.81)
RDW: 13.2 % (ref 11.5–15.5)
WBC: 4.2 10*3/uL (ref 4.0–10.5)

## 2017-03-22 LAB — COMPREHENSIVE METABOLIC PANEL
ALBUMIN: 4.4 g/dL (ref 3.5–5.0)
ALT: 37 U/L (ref 17–63)
AST: 28 U/L (ref 15–41)
Alkaline Phosphatase: 51 U/L (ref 38–126)
Anion gap: 10 (ref 5–15)
BUN: 11 mg/dL (ref 6–20)
CHLORIDE: 102 mmol/L (ref 101–111)
CO2: 26 mmol/L (ref 22–32)
CREATININE: 0.98 mg/dL (ref 0.61–1.24)
Calcium: 8.8 mg/dL — ABNORMAL LOW (ref 8.9–10.3)
GFR calc Af Amer: >60 mL/min (ref >60)
GLUCOSE: 90 mg/dL (ref 65–99)
Potassium: 4.2 mmol/L (ref 3.5–5.1)
Sodium: 138 mmol/L (ref 135–145)
Total Bilirubin: 0.7 mg/dL (ref 0.3–1.2)
Total Protein: 8.1 g/dL (ref 6.5–8.1)

## 2017-03-22 LAB — RAPID URINE DRUG SCREEN, HOSP PERFORMED
Amphetamines: NOT DETECTED
BARBITURATES: NOT DETECTED
Benzodiazepines: NOT DETECTED
COCAINE: NOT DETECTED
Opiates: NOT DETECTED
Tetrahydrocannabinol: NOT DETECTED

## 2017-03-22 LAB — ETHANOL

## 2017-03-22 LAB — SALICYLATE LEVEL: Salicylate Lvl: <7 mg/dL (ref 2.8–30.0)

## 2017-03-22 LAB — ACETAMINOPHEN LEVEL

## 2017-03-22 MED ORDER — BENZTROPINE MESYLATE 1 MG PO TABS: 1.0000 mg | ORAL_TABLET | Freq: Every day | ORAL | 0 refills | Status: DC

## 2017-03-22 MED ORDER — ALUM & MAG HYDROXIDE-SIMETH 200-200-20 MG/5ML PO SUSP: 30.0000 mL | ORAL | Status: DC | PRN

## 2017-03-22 MED ORDER — ZOLPIDEM TARTRATE 5 MG PO TABS: 5.0000 mg | ORAL_TABLET | Freq: Every evening | ORAL | Status: DC | PRN

## 2017-03-22 MED ORDER — BENZTROPINE MESYLATE 1 MG PO TABS: 1.0000 mg | ORAL_TABLET | Freq: Every day | ORAL | Status: DC

## 2017-03-22 MED ORDER — ACETAMINOPHEN 325 MG PO TABS: 650.0000 mg | ORAL_TABLET | ORAL | Status: DC | PRN

## 2017-03-22 MED ORDER — FLUOXETINE HCL 20 MG PO TABS: 20.0000 mg | ORAL_TABLET | Freq: Every day | ORAL | 0 refills | Status: DC

## 2017-03-22 MED ORDER — RISPERIDONE 3 MG PO TABS: 3.0000 mg | ORAL_TABLET | Freq: Every day | ORAL | 0 refills | Status: DC

## 2017-03-22 MED ORDER — RISPERIDONE 2 MG PO TABS: 3.0000 mg | ORAL_TABLET | Freq: Every day | ORAL | Status: DC

## 2017-03-22 MED ORDER — FLUOXETINE HCL 20 MG PO CAPS
20.0000 mg | ORAL_CAPSULE | Freq: Every day | ORAL | Status: DC
  Administered 2017-03-22: 20 mg via ORAL
  Filled 2017-03-22 (×2): qty 1

## 2017-03-22 MED ORDER — IBUPROFEN 200 MG PO TABS: 600.0000 mg | ORAL_TABLET | Freq: Three times a day (TID) | ORAL | Status: DC | PRN

## 2017-03-22 MED ORDER — ONDANSETRON HCL 4 MG PO TABS: 4.0000 mg | ORAL_TABLET | Freq: Three times a day (TID) | ORAL | Status: DC | PRN

## 2017-03-22 NOTE — ED Notes (Addendum)
Written dc instructions and prescriptions reviewed with patient.  Pt encouraged to follow up with [facility] as soon as possible, and take his medications as directed.  Pt denies si/hi at this time and was encouraged to seek treatment if the thoughts or urges return.  Pt verbalized understanding.  Pt ambulatory to dc area with mHt, belongings returned after leaving the area.

## 2017-03-22 NOTE — ED Notes (Signed)
TTS in progress 

## 2017-03-22 NOTE — Discharge Instructions (Signed)
For your ongoing mental health needs, you are advised to follow up with [facility].  If you do not currently have an appointment, new and returning patients are seen at their walk-in clinic.  Walk-in hours are Monday - Friday from 8:00 am - 3:00 pm.  Walk-in patients are seen on a first come, first served basis.  Try to arrive as early as possible for he best chance of being seen the same day: ° °     [facility] °     [address] °     [place], [place] [address] °     [phone] °

## 2017-03-22 NOTE — ED Provider Notes (Signed)
[facility]-EMERGENCY DEPT Provider Note   CSN: [id number] Arrival date & time: 03/22/17  0208     History   Chief Complaint Chief Complaint  Patient presents with  . Hallucinations    Auditory    HPI Revis Whalin is a 31 y.o. male.  HPI Patient presents to the emergency department with auditory hallucinations saying that they want to kill him.  Patient states that he does not history of schizophrenia.  Patient states he has not been taking his medications.  He should states he does not have any suicidal or homicidal ideationsThe patient denies chest pain, shortness of breath, headache,blurred vision, neck pain, fever, cough, weakness, numbness, dizziness, anorexia, edema, abdominal pain, nausea, vomiting, diarrhea, rash, back pain, dysuria, hematemesis, bloody stool, near syncope, or syncope. Past Medical History:  Diagnosis Date  . Homelessness   . Hypercholesterolemia   . Mental disorder   . Paranoid behavior (HCC)   . PE (pulmonary embolism)   . Schizophrenia [facility])     Patient Active Problem List   Diagnosis Date Noted  . Alcohol abuse   . Auditory hallucinations   . Alcohol abuse with alcohol-induced mood disorder (HCC) 09/08/2016  . Adjustment disorder with depressed mood 09/07/2016  . Homelessness 09/03/2016  . Person feigning illness 09/03/2016  . 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 Cannabis use disorder, mild, abuse 08/29/2016  . Paranoid schizophrenia (HCC) 08/26/2016  . Pulmonary emboli (HCC) 07/13/2016  . 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 Acute left flank pain 01/02/2014  . Pulmonary embolism, bilateral (HCC) 01/02/2014    No past surgical history on file.     Home Medications    Prior to Admission medications   Medication Sig Start Date End Date Taking? Authorizing Provider  benztropine (COGENTIN) 1 MG tablet Take 1 tablet (1 mg total) by mouth at bedtime. Patient not taking: Reported on 03/22/2017 03/01/17   Gilda Crease, MD  FLUoxetine (PROZAC) 20 MG tablet Take 1 tablet (20 mg total) by mouth daily. Patient not taking: Reported on 03/22/2017 03/01/17   Gilda Crease, MD  risperiDONE (RISPERDAL) 3 MG tablet Take 1 tablet (3 mg total) by mouth at bedtime. Patient not taking: Reported on 03/22/2017 03/01/17   Gilda Crease, MD    Family History Family History  Problem Relation Age of Onset  . Hypertension Mother     Social History Social History  Substance Use Topics  . Smoking status: Current Every Day Smoker    Packs/day: 0.50    Types: Cigarettes  . Smokeless tobacco: Never Used  . Alcohol use Yes     Allergies   Haldol [haloperidol]   Review of Systems Review of Systems All other systems negative except as documented in the HPI. All pertinent positives and negatives as reviewed in the HPI.  Physical Exam Updated Vital Signs BP 130/81 (BP Location: Right Arm)   Pulse 84   Temp 97.6 F (36.4 C) (Oral)   Resp 16   SpO2 95%   Physical Exam  Constitutional: He is oriented to person, place, and time. He appears well-developed and well-nourished. No distress.  HENT:  Head: Normocephalic and atraumatic.  Mouth/Throat: Oropharynx is clear and moist.  Eyes: Pupils are equal, round, and reactive to light.  Neck: Normal range of motion. Neck supple.  Cardiovascular: Normal rate, regular rhythm and normal heart sounds.  Exam reveals no gallop and no friction rub.   No murmur heard. Pulmonary/Chest: Effort normal and breath sounds normal. No  respiratory distress. He has no wheezes.  Neurological: He is alert and oriented to person, place, and time. He exhibits normal muscle tone. Coordination normal.  Skin: Skin is warm and dry. No rash noted. No erythema.  Psychiatric: His behavior is normal. He is actively hallucinating. He exhibits a depressed mood.  Nursing note and vitals reviewed.    ED Treatments / Results  Labs (all labs ordered are listed, but only  abnormal results are displayed) Labs Reviewed  COMPREHENSIVE METABOLIC PANEL  ETHANOL  SALICYLATE LEVEL  ACETAMINOPHEN LEVEL  CBC  RAPID URINE DRUG SCREEN, HOSP PERFORMED    EKG  EKG Interpretation None       Radiology No results found.  Procedures Procedures (including critical care time)  Medications Ordered in ED Medications - No data to display   Initial Impression / Assessment and Plan / ED Course  I have reviewed the triage vital signs and the nursing notes.  Pertinent labs & imaging results that were available during my care of the patient were reviewed by me and considered in my medical decision making (see chart for details).     Patient need TTS assessment due to his schizophrenia and hallucinations  Final Clinical Impressions(s) / ED Diagnoses   Final diagnoses:  None    New Prescriptions New Prescriptions   No medications on file     Brier Reid, PA-C 03/22/17 2956    Zadie Rhine, MD 03/24/17 [phone]

## 2017-03-22 NOTE — ED Triage Notes (Signed)
Pt complains of hearing voices that are trying to kill him, pt denies SI/HI

## 2017-03-22 NOTE — ED Notes (Signed)
Bed: [facility]34 Expected date:  Expected time:  Means of arrival:  Comments: triage 

## 2017-03-22 NOTE — ED Notes (Addendum)
Pt ambulatory w/o from triage, wanded by security, scrubs changed on arrival.

## 2017-03-22 NOTE — ED Notes (Signed)
Bed: [facility]3 Expected date:  Expected time:  Means of arrival:  Comments: 

## 2017-03-22 NOTE — ED Notes (Signed)
Dr Akintyo and Jamison DNP into see 

## 2017-03-22 NOTE — [facility] Suicide Risk Assessment (Signed)
Suicide Risk Assessment  Discharge Assessment   [facility] Discharge Suicide Risk Assessment   Principal Problem: Paranoid schizophrenia [facility] 2) Discharge Diagnoses:  Patient Active Problem List   Diagnosis Date Noted  . Adjustment disorder with depressed mood [F43.21] 09/07/2016    Priority: High  . Homelessness [Z59.0] 09/03/2016    Priority: High  . Person feigning illness [Z76.5] 09/03/2016    Priority: High  . Alcohol abuse with alcohol-induced mood disorder (HCC) [F10.14] 09/08/2016    Priority: Low  . Paranoid schizophrenia (HCC) [F20.0] 08/26/2016    Priority: Low  . Alcohol abuse [F10.10]   . Auditory hallucinations [R44.0]   . 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 Cannabis use disorder, mild, abuse [F12.10] 08/29/2016  . Pulmonary emboli (HCC) [I26.99] 07/13/2016  . Pulmonary embolism and infarction (HCC) [I26.99] 01/02/2014  . Acute left flank pain [R10.9] 01/02/2014  . Pulmonary embolism, bilateral (HCC) [I26.99] 01/02/2014    Total Time spent with patient: 45 minutes  Musculoskeletal: Strength & Muscle Tone: within normal limits Gait & Station: normal Patient leans: N/A  Psychiatric Specialty Exam:   Blood pressure 130/81, pulse 84, temperature 97.6 F (36.4 C), temperature source Oral, resp. rate 16, SpO2 95 %.There is no height or weight on file to calculate BMI.  General Appearance: Casual  Eye Contact::  Good  Speech:  Normal Rate409  Volume:  Normal  Mood:  Euthymic  Affect:  Congruent  Thought Process:  Coherent and Descriptions of Associations: Intact  Orientation:  Full (Time, Place, and Person)  Thought Content:  WDL and Logical  Suicidal Thoughts:  No  Homicidal Thoughts:  No  Memory:  Immediate;   Good Recent;   Good Remote;   Good  Judgement:  Fair  Insight:  Fair  Psychomotor Activity:  Normal  Concentration:  Good  Recall:  Good  Fund of Knowledge:Fair  Language: Good  Akathisia:  No  Handed:  Right  AIMS (if indicated):      Assets:  Leisure Time Physical Health Resilience Social Support  Sleep:     Cognition: WNL  ADL's:  Intact   Mental Status Per Nursing Assessment::   On Admission:   voices started last night and he felt dizzy, not been taking his antipsychotic medications.  His medications were restarted and he reports he feels better today, no hallucinations or suicidal/homicidal ideations or substance abuse.  Stable for discharge with Rx and encouragement to go to [facility], his outpatient provider.  Demographic Factors:  Male and Adolescent or young adult  Loss Factors: NA  Historical Factors: NA  Risk Reduction Factors:   Sense of responsibility to family, Living with another person, especially a relative and Positive social support  Continued Clinical Symptoms:  None  Cognitive Features That Contribute To Risk:  None    Suicide Risk:  Minimal: No identifiable suicidal ideation.  Patients presenting with no risk factors but with morbid ruminations; may be classified as minimal risk based on the severity of the depressive symptoms    Plan Of Care/Follow-up recommendations:  Activity:  as tolerated Diet:  heart healthy diet  LORD, JAMISON, NP 03/22/2017, 10:23 AM

## 2017-03-22 NOTE — BH Assessment (Signed)
[facility] Assessment Progress Note  Per Thedore Mins, MD, this pt does not require psychiatric hospitalization at this time.  Pt is to be discharged from [facility] with recommendation to continue treatment with [facility].  This has been included in pt's discharge instructions.  Pt's nurse, Wille Celeste, has been notified.  Doylene Canning, MA Triage Specialist [phone]

## 2017-03-22 NOTE — BH Assessment (Signed)
Tele Assessment Note   Cole Barrett is an 31 y.o. male. Pt denies SI/HI. Pt reports AVH but states the voices have stopped. Pt states the voices are not alarming to him. Pt denies current mental health treatment or medication. Pt states he does want treatment at this time. Pt states that if he wants services in the future he will contact [facility]. Pt reports previous hospitalization. Pt denies SA and abuse. Pt denies current depressive symptoms.   Per Dr. Jannifer Franklin Pt does not meet inpatient criteria. Recommends D/C.  Diagnosis:  F20.9 Schizophrenia  Past Medical History:  Past Medical History:  Diagnosis Date  . Homelessness   . Hypercholesterolemia   . Mental disorder   . Paranoid behavior (HCC)   . PE (pulmonary embolism)   . Schizophrenia (HCC)     No past surgical history on file.  Family History:  Family History  Problem Relation Age of Onset  . Hypertension Mother     Social History:  reports that he has been smoking Cigarettes.  He has been smoking about 0.50 packs per day. He has never used smokeless tobacco. He reports that he drinks alcohol. He reports that he uses drugs, including Marijuana.  Additional Social History:  Alcohol / Drug Use Pain Medications: please see mar Prescriptions: please see mar Over the Counter: please see mar History of alcohol / drug use?: No history of alcohol / drug abuse Longest period of sobriety (when/how long): NA  CIWA: CIWA-Ar BP: 130/81 Pulse Rate: 84 COWS:    PATIENT STRENGTHS: (choose at least two) Average or above average intelligence Communication skills  Allergies:  Allergies  Allergen Reactions  . Haldol [Haloperidol] Shortness Of Breath    Home Medications:  (Not in a hospital admission)  OB/GYN Status:  No LMP for male patient.  General Assessment Data Location of Assessment: [facility] TTS Assessment: In system Is this a Tele or Face-to-Face Assessment?: Tele Assessment Is this an Initial  Assessment or a Re-assessment for this encounter?: Initial Assessment Marital status: Single Maiden name: NA Is patient pregnant?: No Pregnancy Status: No Living Arrangements: Alone Can pt return to current living arrangement?: Yes Admission Status: Voluntary Is patient capable of signing voluntary admission?: Yes Referral Source: Self/Family/Friend Insurance type: SP     Crisis Care Plan Living Arrangements: Alone Legal Guardian: Other: (self) Name of Psychiatrist: NA Name of Therapist: Na  Education Status Is patient currently in school?: No Current Grade: NA Highest grade of school patient has completed: 12th Name of school: n/a Contact person: NA  Risk to self with the past 6 months Suicidal Ideation: No Has patient been a risk to self within the past 6 months prior to admission? : No Suicidal Intent: No Has patient had any suicidal intent within the past 6 months prior to admission? : No Is patient at risk for suicide?: No Suicidal Plan?: No Has patient had any suicidal plan within the past 6 months prior to admission? : No Access to Means: No What has been your use of drugs/alcohol within the last 12 months?: NA Previous Attempts/Gestures: No How many times?: 0 Other Self Harm Risks: NA Triggers for Past Attempts: None known Intentional Self Injurious Behavior: None Family Suicide History: No Recent stressful life event(s):  (none known) Persecutory voices/beliefs?: No Depression: No Depression Symptoms:  (pt denies) Substance abuse history and/or treatment for substance abuse?: No Suicide prevention information given to non-admitted patients: Not applicable  Risk to Others within the past 6 months Homicidal Ideation:  No Does patient have any lifetime risk of violence toward others beyond the six months prior to admission? : No Thoughts of Harm to Others: No Comment - Thoughts of Harm to Others: NA Current Homicidal Intent: No Current Homicidal Plan:  No Access to Homicidal Means: No Identified Victim: NA History of harm to others?: No Assessment of Violence: None Noted Violent Behavior Description: NA Does patient have access to weapons?: No Criminal Charges Pending?: No Describe Pending Criminal Charges: NA Does patient have a court date: No Is patient on probation?: No  Psychosis Hallucinations: None noted Delusions: None noted  Mental Status Report Appearance/Hygiene: Unremarkable, In scrubs Eye Contact: Fair Motor Activity: Freedom of movement Speech: Unremarkable Level of Consciousness: Alert Mood: Euthymic Affect: Appropriate to circumstance Anxiety Level: None Thought Processes: Coherent, Relevant Judgement: Unimpaired Orientation: Person, Place, Time, Situation Obsessive Compulsive Thoughts/Behaviors: None  Cognitive Functioning Concentration: Normal Memory: Recent Intact, Remote Intact IQ: Average Insight: Fair Impulse Control: Fair Appetite: Fair Weight Loss: 0 Weight Gain: 0 Sleep: No Change Total Hours of Sleep: 6 Vegetative Symptoms: None  ADLScreening [facility] Assessment Services) Patient's cognitive ability adequate to safely complete daily activities?: Yes Patient able to express need for assistance with ADLs?: Yes Independently performs ADLs?: Yes (appropriate for developmental age)  Prior Inpatient Therapy Prior Inpatient Therapy: Yes Prior Therapy Dates: 2016, 2017 Prior Therapy Facilty/Provider(s): [facility] Reason for Treatment: Schizophrenia  Prior Outpatient Therapy Prior Outpatient Therapy: Yes Prior Therapy Dates: 2017 Prior Therapy Facilty/Provider(s): [facility] Reason for Treatment: med. mngmt. Does patient have an ACCT team?: No Does patient have Intensive In-House Services?  : No Does patient have [facility] services? : No Does patient have P4CC services?: No  ADL Screening (condition at time of admission) Patient's cognitive ability adequate to safely complete daily activities?:  Yes Is the patient deaf or have difficulty hearing?: No Does the patient have difficulty seeing, even when wearing glasses/contacts?: No Does the patient have difficulty concentrating, remembering, or making decisions?: No Patient able to express need for assistance with ADLs?: Yes Does the patient have difficulty dressing or bathing?: No Independently performs ADLs?: Yes (appropriate for developmental age) Does the patient have difficulty walking or climbing stairs?: No Weakness of Legs: None Weakness of Arms/Hands: None       Abuse/Neglect Assessment (Assessment to be complete while patient is alone) Physical Abuse: Denies Verbal Abuse: Denies Sexual Abuse: Denies Exploitation of patient/patient's resources: Denies Self-Neglect: Denies     Merchant navy officer (For Healthcare) Does Patient Have a Medical Advance Directive?: No    Additional Information 1:1 In Past 12 Months?: Yes CIRT Risk: No Elopement Risk: No Does patient have medical clearance?: Yes     Disposition:  Disposition Initial Assessment Completed for this Encounter: Yes Disposition of Patient: Outpatient treatment Type of outpatient treatment: Adult Other disposition(s): Other (Comment)  Tracen Mahler D 03/22/2017 11:24 AM

## 2017-04-04 ENCOUNTER — Emergency Department (HOSPITAL_COMMUNITY)
Admission: EM | Admit: 2017-04-04 | Discharge: 2017-04-05 | Disposition: A | Discharge status: Home / Self Care | Payer: Medicaid Other | Attending: Emergency Medicine | Admitting: Emergency Medicine

## 2017-04-04 DIAGNOSIS — F17210 Nicotine dependence, cigarettes, uncomplicated: Secondary | ICD-10-CM | POA: Insufficient documentation

## 2017-04-04 DIAGNOSIS — R443 Hallucinations, unspecified: Secondary | ICD-10-CM

## 2017-04-04 DIAGNOSIS — F1010 Alcohol abuse, uncomplicated: Secondary | ICD-10-CM

## 2017-04-04 DIAGNOSIS — R519 Headache, unspecified: Secondary | ICD-10-CM

## 2017-04-04 DIAGNOSIS — R51 Headache: Secondary | ICD-10-CM | POA: Diagnosis not present

## 2017-04-04 DIAGNOSIS — Z79899 Other long term (current) drug therapy: Secondary | ICD-10-CM | POA: Diagnosis not present

## 2017-04-04 DIAGNOSIS — F1290 Cannabis use, unspecified, uncomplicated: Secondary | ICD-10-CM | POA: Diagnosis not present

## 2017-04-04 DIAGNOSIS — R440 Auditory hallucinations: Secondary | ICD-10-CM | POA: Insufficient documentation

## 2017-04-05 ENCOUNTER — Encounter (HOSPITAL_COMMUNITY): Payer: Self-pay

## 2017-04-05 DIAGNOSIS — R440 Auditory hallucinations: Secondary | ICD-10-CM

## 2017-04-05 DIAGNOSIS — F1290 Cannabis use, unspecified, uncomplicated: Secondary | ICD-10-CM

## 2017-04-05 DIAGNOSIS — F1010 Alcohol abuse, uncomplicated: Secondary | ICD-10-CM | POA: Diagnosis not present

## 2017-04-05 DIAGNOSIS — F17210 Nicotine dependence, cigarettes, uncomplicated: Secondary | ICD-10-CM

## 2017-04-05 LAB — RAPID URINE DRUG SCREEN, HOSP PERFORMED
AMPHETAMINES: NOT DETECTED
Barbiturates: NOT DETECTED
Benzodiazepines: NOT DETECTED
COCAINE: NOT DETECTED
OPIATES: NOT DETECTED
TETRAHYDROCANNABINOL: NOT DETECTED

## 2017-04-05 LAB — ACETAMINOPHEN LEVEL

## 2017-04-05 LAB — COMPREHENSIVE METABOLIC PANEL
ALT: 40 U/L (ref 17–63)
ANION GAP: 9 (ref 5–15)
AST: 29 U/L (ref 15–41)
Albumin: 4.2 g/dL (ref 3.5–5.0)
Alkaline Phosphatase: 53 U/L (ref 38–126)
BUN: 8 mg/dL (ref 6–20)
CHLORIDE: 105 mmol/L (ref 101–111)
CO2: 24 mmol/L (ref 22–32)
Calcium: 8.8 mg/dL — ABNORMAL LOW (ref 8.9–10.3)
Creatinine, Ser: 0.95 mg/dL (ref 0.61–1.24)
Glucose, Bld: 100 mg/dL — ABNORMAL HIGH (ref 65–99)
POTASSIUM: 3.8 mmol/L (ref 3.5–5.1)
SODIUM: 138 mmol/L (ref 135–145)
Total Bilirubin: 0.5 mg/dL (ref 0.3–1.2)
Total Protein: 7.8 g/dL (ref 6.5–8.1)

## 2017-04-05 LAB — CBC
HCT: 41.4 % (ref 39.0–52.0)
HEMOGLOBIN: 13.7 g/dL (ref 13.0–17.0)
MCH: 27.8 pg (ref 26.0–34.0)
MCHC: 33.1 g/dL (ref 30.0–36.0)
MCV: 84.1 fL (ref 78.0–100.0)
PLATELETS: 327 10*3/uL (ref 150–400)
RBC: 4.92 MIL/uL (ref 4.22–5.81)
RDW: 13 % (ref 11.5–15.5)
WBC: 5.1 10*3/uL (ref 4.0–10.5)

## 2017-04-05 LAB — SALICYLATE LEVEL

## 2017-04-05 LAB — ETHANOL: ALCOHOL ETHYL (B): 110 mg/dL — AB (ref <5)

## 2017-04-05 MED ORDER — NICOTINE 21 MG/24HR TD PT24: 21.0000 mg | MEDICATED_PATCH | Freq: Every day | TRANSDERMAL | 0 refills | Status: DC

## 2017-04-05 MED ORDER — LORAZEPAM 1 MG PO TABS: 1.0000 mg | ORAL_TABLET | Freq: Three times a day (TID) | ORAL | Status: DC | PRN

## 2017-04-05 MED ORDER — NICOTINE 21 MG/24HR TD PT24
21.0000 mg | MEDICATED_PATCH | Freq: Every day | TRANSDERMAL | Status: DC
  Administered 2017-04-05: 21 mg via TRANSDERMAL
  Filled 2017-04-05: qty 1

## 2017-04-05 MED ORDER — BENZTROPINE MESYLATE 1 MG PO TABS: 1.0000 mg | ORAL_TABLET | Freq: Every day | ORAL | 0 refills | Status: DC

## 2017-04-05 MED ORDER — IBUPROFEN 200 MG PO TABS: 600.0000 mg | ORAL_TABLET | Freq: Three times a day (TID) | ORAL | Status: DC | PRN

## 2017-04-05 MED ORDER — THIAMINE HCL 100 MG/ML IJ SOLN: 100.0000 mg | Freq: Every day | INTRAMUSCULAR | Status: DC

## 2017-04-05 MED ORDER — ALUM & MAG HYDROXIDE-SIMETH 200-200-20 MG/5ML PO SUSP
30.0000 mL | ORAL | Status: DC | PRN
Start: 2017-04-05 — End: 2017-04-05

## 2017-04-05 MED ORDER — LORAZEPAM 1 MG PO TABS: 0.0000 mg | ORAL_TABLET | Freq: Two times a day (BID) | ORAL | Status: DC

## 2017-04-05 MED ORDER — RISPERIDONE 3 MG PO TABS: 3.0000 mg | ORAL_TABLET | Freq: Every day | ORAL | 0 refills | Status: DC

## 2017-04-05 MED ORDER — RISPERIDONE 2 MG PO TABS: 3.0000 mg | ORAL_TABLET | Freq: Every day | ORAL | Status: DC

## 2017-04-05 MED ORDER — LORAZEPAM 1 MG PO TABS: 0.0000 mg | ORAL_TABLET | Freq: Four times a day (QID) | ORAL | Status: DC

## 2017-04-05 MED ORDER — VITAMIN B-1 100 MG PO TABS
100.0000 mg | ORAL_TABLET | Freq: Every day | ORAL | Status: DC
  Administered 2017-04-05: 100 mg via ORAL
  Filled 2017-04-05: qty 1

## 2017-04-05 MED ORDER — FLUOXETINE HCL 20 MG PO CAPS: 20.0000 mg | ORAL_CAPSULE | Freq: Every day | ORAL | 0 refills | Status: DC

## 2017-04-05 MED ORDER — ONDANSETRON HCL 4 MG PO TABS: 4.0000 mg | ORAL_TABLET | Freq: Three times a day (TID) | ORAL | Status: DC | PRN

## 2017-04-05 MED ORDER — BENZTROPINE MESYLATE 1 MG PO TABS: 1.0000 mg | ORAL_TABLET | Freq: Every day | ORAL | Status: DC

## 2017-04-05 MED ORDER — ZOLPIDEM TARTRATE 5 MG PO TABS: 5.0000 mg | ORAL_TABLET | Freq: Every evening | ORAL | Status: DC | PRN

## 2017-04-05 MED ORDER — FLUOXETINE HCL 20 MG PO CAPS
20.0000 mg | ORAL_CAPSULE | Freq: Every day | ORAL | Status: DC
  Administered 2017-04-05: 20 mg via ORAL
  Filled 2017-04-05: qty 1

## 2017-04-05 NOTE — BH Assessment (Signed)
[facility] Assessment Progress Note  Per [name], MD, this pt does not require psychiatric hospitalization at this time.  [facility].  This has been included in pt's discharge instructions.  Pt's nurse, Christen BameRonnie, has been notified.  Doylene Canninghomas Lavena Loretto, MA Triage Specialist [phone]

## 2017-04-05 NOTE — Consult Note (Signed)
[facility] Consult   Reason for Consult:  gradual worsening of hallucinations Referring Physician:  EDP Patient Identification: Cole Barrett MRN:  [id number] Principal Diagnosis: <principal problem not specified> Diagnosis:   Patient Active Problem List   Diagnosis Date Noted  . Alcohol abuse [F10.10]   . Auditory hallucinations [R44.0]   . Alcohol abuse with alcohol-induced mood disorder ([place]) [F10.14] 09/08/2016  . Adjustment disorder with depressed mood [F43.21] 09/07/2016  . Homelessness [Z59.0] 09/03/2016  . Person feigning illness [Z76.5] 09/03/2016  . 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 Cannabis use disorder, mild, abuse [F12.10] 08/29/2016  . Paranoid schizophrenia ([place]) [F20.0] 08/26/2016  . Pulmonary emboli ([place]) [I26.99] 07/13/2016  . Pulmonary embolism and infarction ([place]) [I26.99] 01/02/2014  . Acute left flank pain [R10.9] 01/02/2014  . Pulmonary embolism, bilateral ([place]) [I26.99] 01/02/2014    Total Time spent with patient: 30 minutes  Subjective:   Cole Barrett is a 31 y.o. male patient admitted with worsening depression and experiencing hallucinations.    HPI:  Patient has hx of paranoid disorder and schizophrenia.  He [facility] time as.  He admits to non compliance both with meds and with follow up appointments.    He reports that he is here at [facility] because he was trying to appease his mother who told him he needed help with his "head".  Per chart, patient had been receiving care at [facility] and several places such as [facility], [facility] and [facility].  Patient would like to return [facility].    Past Psychiatric History:  See HPI  Risk to Self: Suicidal Ideation: No Suicidal Intent: No Is patient at risk for suicide?: No Suicidal Plan?: No Access to Means: No What has been your use of drugs/alcohol within the last 12 months?: None reported How many  times?: 0 Other Self Harm Risks: None reported Triggers for Past Attempts: None known Intentional Self Injurious Behavior: None Risk to Others: Homicidal Ideation: No Thoughts of Harm to Others: No Comment - Thoughts of Harm to Others: None reported Current Homicidal Intent: No Current Homicidal Plan: No Access to Homicidal Means: No Identified Victim: na History of harm to others?: No Assessment of Violence: None Noted Violent Behavior Description: None reported Does patient have access to weapons?: No Criminal Charges Pending?: Yes Describe Pending Criminal Charges: Open Container Does patient have a court date: Yes Court Date: 04/20/17 Prior Inpatient Therapy: Prior Inpatient Therapy: Yes Prior Therapy Dates: 2016, 2017 Prior Therapy Facilty/Provider([facility] Reason for Treatment: Schizophrenia Prior Outpatient Therapy: Prior Outpatient Therapy: Yes Prior Therapy Dates: 2017 Prior Therapy Facilty/Provider(s): [facility] Reason for Treatment: med. mngmt. Does patient have an ACCT team?: No Does patient have Intensive In-House Services?  : No Does patient have [facility] services? : Yes Does patient have P4CC services?: No  Past Medical History:  Past Medical History:  Diagnosis Date  . Homelessness   . Hypercholesterolemia   . Mental disorder   . Paranoid behavior ([place])   . PE (pulmonary embolism)   . Schizophrenia ([place])    History reviewed. No pertinent surgical history. Family History:  Family History  Problem Relation Age of Onset  . Hypertension Mother    Family Psychiatric  History:  See HPI Social History:  History  Alcohol Use  . Yes     History  Drug Use  . Types: Marijuana    Comment: Once every 2-3 months, Last used: Last month  Social History   Social History  . Marital status: Single    Spouse name: N/A  . Number of children: N/A  . Years of education: N/A   Social History Main Topics  . Smoking status: Current Every Day Smoker     Packs/day: 0.50    Types: Cigarettes  . Smokeless tobacco: Never Used  . Alcohol use Yes  . Drug use: Yes    Types: Marijuana     Comment: Once every 2-3 months, Last used: Last month  . Sexual activity: No   Other Topics Concern  . None   Social History Narrative  . None   Additional Social History:    Allergies:   Allergies  Allergen Reactions  . Haldol [Haloperidol] Shortness Of Breath    Labs:  Results for orders placed or performed during the hospital encounter of 04/04/17 (from the past 48 hour(s))  Comprehensive metabolic panel     Status: Abnormal   Collection Time: 04/05/17 12:10 AM  Result Value Ref Range   Sodium 138 135 - 145 mmol/L   Potassium 3.8 3.5 - 5.1 mmol/L   Chloride 105 101 - 111 mmol/L   CO2 24 22 - 32 mmol/L   Glucose, Bld 100 (H) 65 - 99 mg/dL   BUN 8 6 - 20 mg/dL   Creatinine, Ser 0.95 0.61 - 1.24 mg/dL   Calcium 8.8 (L) 8.9 - 10.3 mg/dL   Total Protein 7.8 6.5 - 8.1 g/dL   Albumin 4.2 3.5 - 5.0 g/dL   AST 29 15 - 41 U/L   ALT 40 17 - 63 U/L   Alkaline Phosphatase 53 38 - 126 U/L   Total Bilirubin 0.5 0.3 - 1.2 mg/dL   GFR calc non Af Amer >60 >60 mL/min   GFR calc Af Amer >60 >60 mL/min    Comment: (NOTE) The eGFR has been calculated using the CKD EPI equation. This calculation has not been validated in all clinical situations. eGFR's persistently <60 mL/min signify possible Chronic Kidney Disease.    Anion gap 9 5 - 15  Ethanol     Status: Abnormal   Collection Time: 04/05/17 12:10 AM  Result Value Ref Range   Alcohol, Ethyl (B) 110 (H) <5 mg/dL    Comment:        LOWEST DETECTABLE LIMIT FOR SERUM ALCOHOL IS 5 mg/dL FOR MEDICAL PURPOSES ONLY   Salicylate level     Status: None   Collection Time: 04/05/17 12:10 AM  Result Value Ref Range   Salicylate Lvl <6.2 2.8 - 30.0 mg/dL  Acetaminophen level     Status: Abnormal   Collection Time: 04/05/17 12:10 AM  Result Value Ref Range   Acetaminophen (Tylenol), Serum <10 (L) 10 -  30 ug/mL    Comment:        THERAPEUTIC CONCENTRATIONS VARY SIGNIFICANTLY. A RANGE OF 10-30 ug/mL MAY BE AN EFFECTIVE CONCENTRATION FOR MANY PATIENTS. HOWEVER, SOME ARE BEST TREATED AT CONCENTRATIONS OUTSIDE THIS RANGE. ACETAMINOPHEN CONCENTRATIONS >150 ug/mL AT 4 HOURS AFTER INGESTION AND >50 ug/mL AT 12 HOURS AFTER INGESTION ARE OFTEN ASSOCIATED WITH TOXIC REACTIONS.   cbc     Status: None   Collection Time: 04/05/17 12:10 AM  Result Value Ref Range   WBC 5.1 4.0 - 10.5 K/uL   RBC 4.92 4.22 - 5.81 MIL/uL   Hemoglobin 13.7 13.0 - 17.0 g/dL   HCT 41.4 39.0 - 52.0 %   MCV 84.1 78.0 - 100.0 fL   MCH 27.8 26.0 -  34.0 pg   MCHC 33.1 30.0 - 36.0 g/dL   RDW 13.0 11.5 - 15.5 %   Platelets 327 150 - 400 K/uL  Rapid urine drug screen (hospital performed)     Status: None   Collection Time: 04/05/17 12:26 AM  Result Value Ref Range   Opiates NONE DETECTED NONE DETECTED   Cocaine NONE DETECTED NONE DETECTED   Benzodiazepines NONE DETECTED NONE DETECTED   Amphetamines NONE DETECTED NONE DETECTED   Tetrahydrocannabinol NONE DETECTED NONE DETECTED   Barbiturates NONE DETECTED NONE DETECTED    Comment:        DRUG SCREEN FOR MEDICAL PURPOSES ONLY.  IF CONFIRMATION IS NEEDED FOR ANY PURPOSE, NOTIFY LAB WITHIN 5 DAYS.        LOWEST DETECTABLE LIMITS FOR URINE DRUG SCREEN Drug Class       Cutoff (ng/mL) Amphetamine      1000 Barbiturate      200 Benzodiazepine   631 Tricyclics       497 Opiates          300 Cocaine          300 THC              50     Current Facility-Administered Medications  Medication Dose Route Frequency Provider Last Rate Last Dose  . alum & mag hydroxide-simeth (MAALOX/MYLANTA) 200-200-20 MG/5ML suspension 30 mL  30 mL Oral PRN Muthersbaugh, Hannah, PA-C      . benztropine (COGENTIN) tablet 1 mg  1 mg Oral QHS Muthersbaugh, Hannah, PA-C      . FLUoxetine (PROZAC) capsule 20 mg  20 mg Oral Daily Muthersbaugh, Hannah, PA-C   20 mg at 04/05/17 1016  .  ibuprofen (ADVIL,MOTRIN) tablet 600 mg  600 mg Oral Q8H PRN Muthersbaugh, Jarrett Soho, PA-C      . LORazepam (ATIVAN) tablet 0-4 mg  0-4 mg Oral Q6H Muthersbaugh, Hannah, PA-C       Followed by  . [START ON 04/07/2017] LORazepam (ATIVAN) tablet 0-4 mg  0-4 mg Oral Q12H Muthersbaugh, Hannah, PA-C      . LORazepam (ATIVAN) tablet 1 mg  1 mg Oral Q8H PRN Muthersbaugh, Hannah, PA-C      . nicotine (NICODERM CQ - dosed in mg/24 hours) patch 21 mg  21 mg Transdermal Daily Muthersbaugh, Hannah, PA-C   21 mg at 04/05/17 1016  . ondansetron (ZOFRAN) tablet 4 mg  4 mg Oral Q8H PRN Muthersbaugh, Hannah, PA-C      . risperiDONE (RISPERDAL) tablet 3 mg  3 mg Oral QHS Muthersbaugh, Hannah, PA-C      . thiamine (VITAMIN B-1) tablet 100 mg  100 mg Oral Daily Muthersbaugh, Jarrett Soho, PA-C   100 mg at 04/05/17 1016   Or  . thiamine (B-1) injection 100 mg  100 mg Intravenous Daily Muthersbaugh, Hannah, PA-C      . zolpidem (AMBIEN) tablet 5 mg  5 mg Oral QHS PRN Muthersbaugh, Jarrett Soho, PA-C       Current Outpatient Prescriptions  Medication Sig Dispense Refill  . benztropine (COGENTIN) 1 MG tablet Take 1 tablet (1 mg total) by mouth at bedtime. (Patient not taking: Reported on 04/05/2017) 30 tablet 0  . FLUoxetine (PROZAC) 20 MG tablet Take 1 tablet (20 mg total) by mouth daily. (Patient not taking: Reported on 04/05/2017) 30 tablet 0  . risperiDONE (RISPERDAL) 3 MG tablet Take 1 tablet (3 mg total) by mouth at bedtime. (Patient not taking: Reported on 04/05/2017) 30 tablet 0  Musculoskeletal: Strength & Muscle Tone: within normal limits Gait & Station: normal Patient leans: N/A  Psychiatric Specialty Exam: Physical Exam  Nursing note and vitals reviewed. Psychiatric: His mood appears not anxious. He is not agitated and not aggressive. Thought content is not paranoid. He does not exhibit a depressed mood. He expresses no homicidal and no suicidal ideation.    Review of Systems  Psychiatric/Behavioral: Negative for  depression, hallucinations and suicidal ideas. The patient is not nervous/anxious and does not have insomnia.   All other systems reviewed and are negative.   Blood pressure (!) 132/93, pulse (!) 57, temperature 98.2 F (36.8 C), temperature source Oral, resp. rate 18, height '5\' 10"'$  (1.778 m), weight 99.8 kg (220 lb), SpO2 96 %.Body mass index is 31.57 kg/m.  General Appearance: Casual  Eye Contact:  Good  Speech:  Clear and Coherent  Volume:  Normal  Mood:  Euthymic  Affect:  Appropriate  Thought Process:  Coherent  Orientation:  Full (Time, Place, and Person)  Thought Content:  Logical  Suicidal Thoughts:  No denies  Homicidal Thoughts:  No denies  Memory:  Immediate;   Good Recent;   Good Remote;   Good  Judgement:  Good  Insight:  Good  Psychomotor Activity:  Normal  Concentration:  Concentration: Fair and Attention Span: Fair  Recall:  [employer] of Knowledge:  Fair  Language:  NA  Akathisia:  No  Handed:  Right  AIMS (if indicated):     Assets:  Financial Resources/Insurance Talents/Skills  ADL's:  Intact  Cognition:  WNL  Sleep:  6 hrs   Treatment Plan Summary: 1.  Take all your medications as prescribed.   2.  Report any adverse side effects to outpatient provider. 3.  Patient instructed to not use alcohol or illegal drugs while on prescription medicines. 4.  In the event of worsening symptoms, instructed patient to call 911, the crisis hotline or go to nearest emergency room for evaluation of symptoms.  Disposition: No evidence of imminent risk to self or others at present.   Patient does not meet criteria for psychiatric inpatient admission. Discussed crisis plan, support from social network, calling 911, coming to the Emergency Department, and calling Suicide Hotline. Resources given for substances Abuse.  [facility] follow up.  Janett Labella, NP [facility] 04/05/2017 11:10 AM  Patient seen face-to-face for psychiatric evaluation, chart  reviewed and case discussed with the physician extender and developed treatment plan. Reviewed the information documented and agree with the treatment plan. Corena Pilgrim, MD

## 2017-04-05 NOTE — ED Triage Notes (Signed)
Pt c/o of auditory hallucinations and paranoia. He states that the voices say that they want to kill him, but they do NOT encourage him to hurt himself. He denies suicidal ideation or visual hallucinations. A&Ox4. Ambulatory. Calm and cooperative in triage.

## 2017-04-05 NOTE — [facility] Suicide Risk Assessment (Signed)
Suicide Risk Assessment  Discharge Assessment   [facility]   Principal Problem: <principal problem not specified> Discharge Diagnoses:  Patient Active Problem List   Diagnosis Date Noted  . Alcohol abuse [F10.10]   . Auditory hallucinations [R44.0]   . Alcohol abuse with alcohol-induced mood disorder (HCC) [F10.14] 09/08/2016  . Adjustment disorder with depressed mood [F43.21] 09/07/2016  . Homelessness [Z59.0] 09/03/2016  . Person feigning illness [Z76.5] 09/03/2016  . 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 Cannabis use disorder, mild, abuse [F12.10] 08/29/2016  . Paranoid schizophrenia (HCC) [F20.0] 08/26/2016  . Pulmonary emboli (HCC) [I26.99] 07/13/2016  . Pulmonary embolism and infarction (HCC) [I26.99] 01/02/2014  . Acute left flank pain [R10.9] 01/02/2014  . Pulmonary embolism, bilateral (HCC) [I26.99] 01/02/2014    Total Time spent with patient: 30 minutes  Musculoskeletal: Strength & Muscle Tone: within normal limits Gait & Station: normal Patient leans: N/A  Psychiatric Specialty Exam:   Blood pressure (!) 132/93, pulse (!) 57, temperature 98.2 F (36.8 C), temperature source Oral, resp. rate 18, height 5\' 10"  (1.778 m), weight 99.8 kg (220 lb), SpO2 96 %.Body mass index is 31.57 kg/m.   General Appearance: Casual  Eye Contact:  Good  Speech:  Clear and Coherent  Volume:  Normal  Mood:  Euthymic  Affect:  Appropriate  Thought Process:  Coherent  Orientation:  Full (Time, Place, and Person)  Thought Content:  Logical  Suicidal Thoughts:  No denies  Homicidal Thoughts:  No denies  Memory:  Immediate;   Good Recent;   Good Remote;   Good  Judgement:  Good  Insight:  Good  Psychomotor Activity:  Normal  Concentration:  Concentration: Fair and Attention Span: Fair  Recall:  [employer]Fair  Fund of Knowledge:  Fair  Language:  NA  Akathisia:  No  Handed:  Right  AIMS (if indicated):     Assets:  Financial  Resources/Insurance Talents/Skills  ADL's:  Intact  Cognition:  WNL  Sleep:  6 hrs   Mental Status Per Nursing Assessment::   On Admission:     Demographic Factors:  Male and Low socioeconomic status  Loss Factors: Decrease in vocational status and Financial problems/change in socioeconomic status  Historical Factors: Impulsivity  Risk Reduction Factors:   Positive social support  Continued Clinical Symptoms:  Alcohol/Substance Abuse/Dependencies  Cognitive Features That Contribute To Risk:  Thought constriction (tunnel vision)    Suicide Risk:  Minimal: No identifiable suicidal ideation.  Patients presenting with no risk factors but with morbid ruminations; may be classified as minimal risk based on the severity of the depressive symptoms   Plan Of Care/Follow-up recommendations:  Activity:  as tol Diet:  as tol Other:  [facility]  ChuichuSheila May Haruki Arnold, NP [facility] 04/05/2017, 11:29 AM

## 2017-04-05 NOTE — BH Assessment (Addendum)
Tele Assessment Note   Cole Barrett is an 31 y.o. male presenting in [facility] due to auditory hallucinations.  Pt does not endorse SI/HI. Pt sts the voices got louder tonight and was telling him to kill himself.  Pt sts he is fine as of now.  Pt sts he receives his medication from [facility]. Pt sts he is not in therapy.  Pt sts he is homeless and does not have a dwelling to report. Pt sts he is happy and does not have any stressors. Pt sts, "I am very happy with my life."  Pt sts he does not abuse alcohol or illicit drugs.  Pt sts he has one pending charge for having an open container in public. Pt has one upcoming court appt. Pt denies any violent behaviors.  Pt presented in scrubs with an unremarkable appearence.  Pt presented with good eye contact, freedom of movement, and was sleeping during the interview.  Pt did complete the interview and was cooperative.  Pt sts he is happy  And appears to be coherent and in his thoughts.  Pt judgement appears unimpaired and his insight fair.  Pt was oriented x 4.  Pt is being recommended for an AM psyche eval per Donell Sievert, NP.  Diagnosis: Schizophrenia   Past Medical History:  Past Medical History:  Diagnosis Date  . Homelessness   . Hypercholesterolemia   . Mental disorder   . Paranoid behavior (HCC)   . PE (pulmonary embolism)   . Schizophrenia (HCC)     History reviewed. No pertinent surgical history.  Family History:  Family History  Problem Relation Age of Onset  . Hypertension Mother     Social History:  reports that he has been smoking Cigarettes.  He has been smoking about 0.50 packs per day. He has never used smokeless tobacco. He reports that he drinks alcohol. He reports that he uses drugs, including Marijuana.  Additional Social History:  Alcohol / Drug Use Pain Medications: See MAR Prescriptions: See MAR Over the Counter: See MAR History of alcohol / drug use?: No history of alcohol / drug abuse  CIWA:  CIWA-Ar BP: 116/75 Pulse Rate: (!) 58 COWS:    PATIENT STRENGTHS: (choose at least two) Active sense of humor Communication skills Supportive family/friends  Allergies:  Allergies  Allergen Reactions  . Haldol [Haloperidol] Shortness Of Breath    Home Medications:  (Not in a hospital admission)  OB/GYN Status:  No LMP for male patient.  General Assessment Data [facility]: [facility] TTS Assessment: In system Is this a Tele or Face-to-Face Assessment?: Face-to-Face Is this an Initial Assessment or a Re-assessment for this encounter?: Initial Assessment Marital status: Single Living Arrangements: Alone Can pt return to current living arrangement?: Yes Admission Status: Voluntary Is patient capable of signing voluntary admission?: Yes Referral Source: Self/Family/Friend Insurance type: Medicaid     Crisis Care Plan Living Arrangements: Alone Legal Guardian: Other: (Self) Name of Psychiatrist: NA Name of Therapist: Na  Education Status Is patient currently in school?: No Highest grade of school patient has completed: some college Name of school: n/a Contact person: NA  Risk to self with the past 6 months Suicidal Ideation: No Has patient been a risk to self within the past 6 months prior to admission? : No Suicidal Intent: No Has patient had any suicidal intent within the past 6 months prior to admission? : No Is patient at risk for suicide?: No Suicidal Plan?: No Has patient had any suicidal  plan within the past 6 months prior to admission? : No Access to Means: No What has been your use of drugs/alcohol within the last 12 months?: None reported Previous Attempts/Gestures: No How many times?: 0 Other Self Harm Risks: None reported Triggers for Past Attempts: None known Intentional Self Injurious Behavior: None Family Suicide History: No Recent stressful life event(s):  (None reported) Persecutory voices/beliefs?: No Depression: No Depression  Symptoms:  (non reported) Substance abuse history and/or treatment for substance abuse?: No Suicide prevention information given to non-admitted patients: Not applicable  Risk to Others within the past 6 months Homicidal Ideation: No Does patient have any lifetime risk of violence toward others beyond the six months prior to admission? : No Thoughts of Harm to Others: No Comment - Thoughts of Harm to Others: None reported Current Homicidal Intent: No Current Homicidal Plan: No Access to Homicidal Means: No Identified Victim: na History of harm to others?: No Assessment of Violence: None Noted Violent Behavior Description: None reported Does patient have access to weapons?: No Criminal Charges Pending?: Yes Describe Pending Criminal Charges: Open Container Does patient have a court date: Yes Court Date: 04/20/17 Is patient on probation?: No  Psychosis Hallucinations: Auditory (Voices they are going to kill him) Delusions: None noted  Mental Status Report Appearance/Hygiene: Unremarkable, In scrubs Eye Contact: Fair Motor Activity: Freedom of movement Speech: Unremarkable Level of Consciousness: Sleeping Mood: Euphoric Affect: Appropriate to circumstance Anxiety Level: None Thought Processes: Coherent Judgement: Unimpaired Orientation: Person, Place, Time, Situation Obsessive Compulsive Thoughts/Behaviors: None  Cognitive Functioning Concentration: Normal Memory: Recent Intact, Remote Intact IQ: Average Insight: Fair Impulse Control: Fair Appetite: Good Weight Loss: 0 Weight Gain: 0 Sleep: Increased Total Hours of Sleep: 8 Vegetative Symptoms: None  ADLScreening [facility] Assessment Services) Patient's cognitive ability adequate to safely complete daily activities?: Yes Patient able to express need for assistance with ADLs?: Yes Independently performs ADLs?: Yes (appropriate for developmental age)  Prior Inpatient Therapy Prior Inpatient Therapy: Yes Prior  Therapy Dates: 2016, 2017 [facility] Reason for Treatment: Schizophrenia  Prior Outpatient Therapy Prior Outpatient Therapy: Yes Prior Therapy Dates: 2017 Prior Therapy Facilty/Provider(s): [facility] Reason for Treatment: med. mngmt. Does patient have an ACCT team?: No Does patient have Intensive In-House Services?  : No Does patient have [facility] services? : Yes Does patient have P4CC services?: No  ADL Screening (condition at time of admission) Patient's cognitive ability adequate to safely complete daily activities?: Yes Patient able to express need for assistance with ADLs?: Yes Independently performs ADLs?: Yes (appropriate for developmental age)       Abuse/Neglect Assessment (Assessment to be complete while patient is alone) Physical Abuse: Denies Verbal Abuse: Denies Sexual Abuse: Denies Exploitation of patient/patient's resources: Denies Self-Neglect: Denies Values / Beliefs Cultural Requests During Hospitalization: None Spiritual Requests During Hospitalization: None Consults Spiritual Care Consult Needed: No Social Work Consult Needed: No Merchant navy officerAdvance Directives (For Healthcare) Does Patient Have a Medical Advance Directive?: No Would patient like information on creating a medical advance directive?: No - Patient declined    Additional Information 1:1 In Past 12 Months?: Yes CIRT Risk: No Elopement Risk: No Does patient have medical clearance?: Yes     Disposition:  Disposition Initial Assessment Completed for this Encounter: Yes Disposition of Patient: Other dispositions (AM  Psyhe Eval per Donell SievertSimon Spencer NP) Type of outpatient treatment: Adult Other disposition(s): Other (Comment)  Zenovia Jordanva L [facility] 04/05/2017 5:34 AM

## 2017-04-05 NOTE — Discharge Instructions (Signed)
For your ongoing behavioral health needs you are advised to follow up with [facility].  New patients are seen at their walk-in clinic.  Walk-in hours are Monday - Friday from 8:00 am - 12:00 pm, and from 1:00 pm - 3:00 pm.  Walk-in patients are seen on a first come, first served basis, so try to arrive as early as possible for the best chance of being seen the same day.  There is an initial fee of $22.50: ° °     [facility] °     [address] °     [place], [place] [address] °     [phone] °

## 2017-04-05 NOTE — Progress Notes (Signed)
CSW spoke with patient regarding discharge plans. Patient stated he is currently living in his own house by himself. Patient stated he is able to return home and requested a bus pass for transportation. CSW will provide RN will bus pass for patient.   Stacy GardnerErin Chennel Olivos, LCSWA Clinical Social Worker [phone]

## 2017-04-05 NOTE — ED Notes (Signed)
Patient educated about search process and term "contraband " and routine search performed. No contraband found. 

## 2017-04-05 NOTE — ED Notes (Signed)
Patient denies SI,HI and VH. Patient reports AH telling him to hurt himself. Plan of care discussed. Encouragement and support provided and safety maintain. Q 15 min safety checks in place and video monitoring.

## 2017-04-05 NOTE — ED Provider Notes (Signed)
[facility]-EMERGENCY DEPT Provider Note   CSN: [id number] Arrival date & time: 04/04/17  2314  By signing my name below, I, Majel Homer, attest that this documentation has been prepared under the direction and in the presence of [facility], PA-C, . Electronically Signed: Majel Homer, Scribe. 04/05/2017. 1:16 AM.  History   Chief Complaint Chief Complaint  Patient presents with  . Hallucinations   The history is provided by the patient and medical records. No language interpreter was used.   HPI Comments: Cole Barrett is a 31 y.o. male with PMHx of homelessness, paranoid disorder, schizophrenia, and PE, who presents to the Emergency Department for an evaluation of gradually worsening, hallucinations and paranoia that began ~2 weeks ago. Pt reports he is "hearing voices in my head that are mad at him." He states they "must be mad at me because I'm trying to do my own thing." He notes associated gradual onset, headache that began ~1 week ago. He denies any SI, HI, visual disturbances, nausea, vomiting, or diarrhea. Pt admits to drinking EtOH but denies any IV or illicit drug use.   Past Medical History:  Diagnosis Date  . Homelessness   . Hypercholesterolemia   . Mental disorder   . Paranoid behavior (HCC)   . PE (pulmonary embolism)   . Schizophrenia [facility])     Patient Active Problem List   Diagnosis Date Noted  . Alcohol abuse   . Auditory hallucinations   . Alcohol abuse with alcohol-induced mood disorder (HCC) 09/08/2016  . Adjustment disorder with depressed mood 09/07/2016  . Homelessness 09/03/2016  . Person feigning illness 09/03/2016  . 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 Cannabis use disorder, mild, abuse 08/29/2016  . Paranoid schizophrenia (HCC) 08/26/2016  . Pulmonary emboli (HCC) 07/13/2016  . 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 Acute left flank pain 01/02/2014  . Pulmonary embolism, bilateral (HCC) 01/02/2014   History reviewed. No  pertinent surgical history.  Home Medications    Prior to Admission medications   Medication Sig Start Date End Date Taking? Authorizing Provider  benztropine (COGENTIN) 1 MG tablet Take 1 tablet (1 mg total) by mouth at bedtime. Patient not taking: Reported on 04/05/2017 03/22/17   Charm Rings, NP  FLUoxetine (PROZAC) 20 MG tablet Take 1 tablet (20 mg total) by mouth daily. Patient not taking: Reported on 04/05/2017 03/22/17   Charm Rings, NP  risperiDONE (RISPERDAL) 3 MG tablet Take 1 tablet (3 mg total) by mouth at bedtime. Patient not taking: Reported on 04/05/2017 03/22/17   Charm Rings, NP    Family History Family History  Problem Relation Age of Onset  . Hypertension Mother     Social History Social History  Substance Use Topics  . Smoking status: Current Every Day Smoker    Packs/day: 0.50    Types: Cigarettes  . Smokeless tobacco: Never Used  . Alcohol use Yes   Allergies   Haldol [haloperidol]  Review of Systems Review of Systems  Eyes: Negative for visual disturbance.  Gastrointestinal: Negative for diarrhea, nausea and vomiting.  Neurological: Positive for headaches.  Psychiatric/Behavioral: Positive for hallucinations. Negative for suicidal ideas.  All other systems reviewed and are negative.  Physical Exam Updated Vital Signs BP 116/75 (BP Location: Right Arm)   Pulse (!) 58   Temp 98.3 F (36.8 C) (Oral)   Resp 18   Ht 5\' 10"  (1.778 m)   Wt 220 lb (99.8 kg)   SpO2 100%   BMI 31.57  kg/m   Physical Exam  Constitutional: He appears well-developed and well-nourished.  HENT:  Head: Normocephalic.  Eyes: EOM are normal.  Neck: Normal range of motion.  Cardiovascular: Normal rate and regular rhythm.   Pulmonary/Chest: Effort normal and breath sounds normal.  Abdominal: Soft. He exhibits no distension.  Musculoskeletal: Normal range of motion.  Neurological: He is alert.  Skin: Skin is warm and dry.  Psychiatric: His mood appears anxious. He  is actively hallucinating.  Nursing note and vitals reviewed.  ED Treatments / Results  DIAGNOSTIC STUDIES:  Oxygen Saturation is 100% on RA, normal by my interpretation.    COORDINATION OF CARE:  1:14 AM Discussed treatment plan with pt at bedside and pt agreed to plan.  Labs (all labs ordered are listed, but only abnormal results are displayed) Labs Reviewed  COMPREHENSIVE METABOLIC PANEL - Abnormal; Notable for the following:       Result Value   Glucose, Bld 100 (*)    Calcium 8.8 (*)    All other components within normal limits  ETHANOL - Abnormal; Notable for the following:    Alcohol, Ethyl (B) 110 (*)    All other components within normal limits  ACETAMINOPHEN LEVEL - Abnormal; Notable for the following:    Acetaminophen (Tylenol), Serum <10 (*)    All other components within normal limits  SALICYLATE LEVEL  CBC  RAPID URINE DRUG SCREEN, HOSP PERFORMED    Procedures Procedures (including critical care time)  Medications Ordered in ED Medications  LORazepam (ATIVAN) tablet 1 mg (not administered)  ibuprofen (ADVIL,MOTRIN) tablet 600 mg (not administered)  zolpidem (AMBIEN) tablet 5 mg (not administered)  nicotine (NICODERM CQ - dosed in mg/24 hours) patch 21 mg (not administered)  ondansetron (ZOFRAN) tablet 4 mg (not administered)  alum & mag hydroxide-simeth (MAALOX/MYLANTA) 200-200-20 MG/5ML suspension 30 mL (not administered)  thiamine (VITAMIN B-1) tablet 100 mg (not administered)    Or  thiamine (B-1) injection 100 mg (not administered)  LORazepam (ATIVAN) tablet 0-4 mg (not administered)    Followed by  LORazepam (ATIVAN) tablet 0-4 mg (not administered)    Initial Impression / Assessment and Plan / ED Course  I have reviewed the triage vital signs and the nursing notes.  Pertinent labs & imaging results that were available during my care of the patient were reviewed by me and considered in my medical decision making (see chart for details).      Patient with active hallucinations. Denies suicidal or homicidal ideation. Labs reassuring. Patient's headache is not concerning for subarachnoid hemorrhage or intracranial hemorrhage.  Patient largely cooperative with exam, stating he just wants to "chill."  Labs reviewed. Patient is intoxicated. Will allow him to sober and be evaluated by TTS. Patient is otherwise medically cleared.  I personally performed the services described in this documentation, which was scribed in my presence. The recorded information has been reviewed and is accurate.   Final Clinical Impressions(s) / ED Diagnoses   Final diagnoses:  Hallucinations  ETOH abuse  Nonintractable headache, unspecified chronicity pattern, unspecified headache type    New Prescriptions New Prescriptions   No medications on file     Milta DeitersMuthersbaugh, Jaelene Garciagarcia, PA-C 04/05/17 0134    Pricilla LovelessGoldston, Scott, MD 04/06/17 [phone]

## 2017-06-10 ENCOUNTER — Emergency Department (HOSPITAL_COMMUNITY)
Admission: EM | Admit: 2017-06-10 | Discharge: 2017-06-10 | Disposition: A | Discharge status: Home / Self Care | Payer: Medicaid Other | Attending: Emergency Medicine | Admitting: Emergency Medicine

## 2017-06-10 ENCOUNTER — Encounter (HOSPITAL_COMMUNITY): Payer: Self-pay | Admitting: *Deleted

## 2017-06-10 DIAGNOSIS — F17210 Nicotine dependence, cigarettes, uncomplicated: Secondary | ICD-10-CM | POA: Diagnosis not present

## 2017-06-10 DIAGNOSIS — F4321 Adjustment disorder with depressed mood: Secondary | ICD-10-CM | POA: Diagnosis not present

## 2017-06-10 DIAGNOSIS — R440 Auditory hallucinations: Secondary | ICD-10-CM | POA: Diagnosis not present

## 2017-06-10 DIAGNOSIS — R443 Hallucinations, unspecified: Secondary | ICD-10-CM

## 2017-06-10 DIAGNOSIS — Z79899 Other long term (current) drug therapy: Secondary | ICD-10-CM | POA: Insufficient documentation

## 2017-06-10 LAB — CBC
HEMATOCRIT: 34.9 % — AB (ref 39.0–52.0)
Hemoglobin: 12 g/dL — ABNORMAL LOW (ref 13.0–17.0)
MCH: 28 pg (ref 26.0–34.0)
MCHC: 34.4 g/dL (ref 30.0–36.0)
MCV: 81.4 fL (ref 78.0–100.0)
Platelets: 282 10*3/uL (ref 150–400)
RBC: 4.29 MIL/uL (ref 4.22–5.81)
RDW: 12.7 % (ref 11.5–15.5)
WBC: 4 10*3/uL (ref 4.0–10.5)

## 2017-06-10 LAB — COMPREHENSIVE METABOLIC PANEL
ALBUMIN: 3.7 g/dL (ref 3.5–5.0)
ALK PHOS: 45 U/L (ref 38–126)
ALT: 34 U/L (ref 17–63)
ANION GAP: 9 (ref 5–15)
AST: 24 U/L (ref 15–41)
BUN: 9 mg/dL (ref 6–20)
CALCIUM: 8.4 mg/dL — AB (ref 8.9–10.3)
CHLORIDE: 106 mmol/L (ref 101–111)
CO2: 25 mmol/L (ref 22–32)
Creatinine, Ser: 0.92 mg/dL (ref 0.61–1.24)
GFR calc Af Amer: >60 mL/min (ref >60)
GFR calc non Af Amer: >60 mL/min (ref >60)
GLUCOSE: 138 mg/dL — AB (ref 65–99)
Potassium: 3.4 mmol/L — ABNORMAL LOW (ref 3.5–5.1)
SODIUM: 140 mmol/L (ref 135–145)
Total Bilirubin: 0.4 mg/dL (ref 0.3–1.2)
Total Protein: 6.8 g/dL (ref 6.5–8.1)

## 2017-06-10 LAB — ACETAMINOPHEN LEVEL

## 2017-06-10 LAB — RAPID URINE DRUG SCREEN, HOSP PERFORMED
AMPHETAMINES: NOT DETECTED
BARBITURATES: NOT DETECTED
Benzodiazepines: NOT DETECTED
COCAINE: NOT DETECTED
OPIATES: NOT DETECTED
Tetrahydrocannabinol: NOT DETECTED

## 2017-06-10 LAB — ETHANOL: Alcohol, Ethyl (B): <5 mg/dL (ref <5)

## 2017-06-10 LAB — SALICYLATE LEVEL

## 2017-06-10 MED ORDER — BENZTROPINE MESYLATE 1 MG PO TABS: 1.0000 mg | ORAL_TABLET | Freq: Every day | ORAL | 0 refills | Status: DC

## 2017-06-10 MED ORDER — NICOTINE 21 MG/24HR TD PT24
21.0000 mg | MEDICATED_PATCH | Freq: Every day | TRANSDERMAL | Status: DC
  Administered 2017-06-10: 21 mg via TRANSDERMAL
  Filled 2017-06-10: qty 1

## 2017-06-10 MED ORDER — BENZTROPINE MESYLATE 1 MG PO TABS: 1.0000 mg | ORAL_TABLET | Freq: Every day | ORAL | Status: DC

## 2017-06-10 MED ORDER — RISPERIDONE 3 MG PO TABS: 3.0000 mg | ORAL_TABLET | Freq: Every day | ORAL | 0 refills | Status: DC

## 2017-06-10 MED ORDER — FLUOXETINE HCL 20 MG PO TABS
20.0000 mg | ORAL_TABLET | Freq: Every day | ORAL | 0 refills | Status: DC
Start: 2017-06-10 — End: 2017-09-27

## 2017-06-10 MED ORDER — RISPERIDONE 2 MG PO TABS: 3.0000 mg | ORAL_TABLET | Freq: Every day | ORAL | Status: DC

## 2017-06-10 MED ORDER — FLUOXETINE HCL 20 MG PO CAPS
20.0000 mg | ORAL_CAPSULE | Freq: Every day | ORAL | Status: DC
  Administered 2017-06-10: 20 mg via ORAL
  Filled 2017-06-10 (×2): qty 1

## 2017-06-10 NOTE — ED Notes (Signed)
Pt brought from Triage with 2 bags of food.  1 bag contained chips, the other Congohinese.  Informed the pt is unable to keep food in room, can put the store bought items in locker but the Congohinese cannot stay in the dept. Refrigerator.  Pt requesting to leave dept with food for 10 minutes stating "I'll call a friend to come pick it up. And I'll wait on him outside  He just lives down the road."  Informed pt he will not be allowed to leave dept with food to wait on friend.  Pt then stated "I guess just throw it away."  Other food placed in locker.

## 2017-06-10 NOTE — [facility] Suicide Risk Assessment (Signed)
Suicide Risk Assessment  Discharge Assessment   [facility]   Principal Problem: Adjustment disorder with depressed mood Discharge Diagnoses:  Patient Active Problem List   Diagnosis Date Noted  . Adjustment disorder with depressed mood [F43.21] 09/07/2016    Priority: High  . Homelessness [Z59.0] 09/03/2016    Priority: High  . Person feigning illness [Z76.5] 09/03/2016    Priority: High  . Alcohol abuse with alcohol-induced mood disorder (HCC) [F10.14] 09/08/2016    Priority: Low  . Paranoid schizophrenia (HCC) [F20.0] 08/26/2016    Priority: Low  . Alcohol abuse [F10.10]   . Auditory hallucinations [R44.0]   . 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 Cannabis use disorder, mild, abuse [F12.10] 08/29/2016  . Pulmonary emboli (HCC) [I26.99] 07/13/2016  . Pulmonary embolism and infarction (HCC) [I26.99] 01/02/2014  . Acute left flank pain [R10.9] 01/02/2014  . Pulmonary embolism, bilateral (HCC) [I26.99] 01/02/2014    Total Time spent with patient: 45 minutes  Musculoskeletal: Strength & Muscle Tone: within normal limits Gait & Station: normal Patient leans: N/A  Psychiatric Specialty Exam:   Blood pressure (!) 148/83, pulse 88, temperature 98.6 F (37 C), temperature source Oral, resp. rate 20, height 5\' 10"  (1.778 m), weight 99.8 kg (220 lb), SpO2 98 %.Body mass index is 31.57 kg/m.  General Appearance: Casual  Eye Contact::  Good  Speech:  Normal Rate409  Volume:  Normal  Mood:  Depressed, mild  Affect:  Congruent  Thought Process:  Coherent and Descriptions of Associations: Intact  Orientation:  Full (Time, Place, and Person)  Thought Content:  WDL and Logical  Suicidal Thoughts:  No  Homicidal Thoughts:  No  Memory:  Immediate;   Good Recent;   Good Remote;   Good  Judgement:  Fair  Insight:  Fair  Psychomotor Activity:  Normal  Concentration:  Good  Recall:  Good  Fund of Knowledge:Fair  Language: Good  Akathisia:  No   Handed:  Right  AIMS (if indicated):     Assets:  Leisure Time Physical Health Resilience  Sleep:     Cognition: WNL  ADL's:  Intact   Mental Status Per Nursing Assessment::   On Admission:   Reported hallucinations but shortly after coming to the ED, he wanted to leave.  He denies suicidal/homicidal ideations, hallucinations, and alcohol/drug abuse.  Reports he just needs his medications because [facility].  Not responding to internal stimuli.  He is well known to this ED and providers with similar presentations, stable for discharge.  Demographic Factors:  Male  Loss Factors: NA  Historical Factors: NA  Risk Reduction Factors:   Sense of responsibility to family and Positive therapeutic relationship  Continued Clinical Symptoms:  Depression, mild  Cognitive Features That Contribute To Risk:  None    Suicide Risk:  Minimal: No identifiable suicidal ideation.  Patients presenting with no risk factors but with morbid ruminations; may be classified as minimal risk based on the severity of the depressive symptoms    Plan Of Care/Follow-up recommendations:  Activity:  as toleraed Diet:  heart healthy diet  Tiearra Colwell, NP 06/10/2017, 11:20 AM

## 2017-06-10 NOTE — BH Assessment (Addendum)
Tele Assessment Note   Cole Barrett is an 31 y.o. male who [facility] tell him they are going [facility] him. Pt is short with this writer throughout the assessment and has poor eye contact. Pt continued to respond with "no" to most of the questions he was asked. Pt denies SI and HI. Pt reports that he has heard voices for many years and states that he does not intend to act on the voices saying they are going to kill him. Pt denies SA. Pt states he is not prescribed any medication, but states [facility]. When asked why the [facility], he stated he has not been "in a while." Per chart, pt reported to the EDP that the hallucinations worsen at 12 midnight. Pt has 16 ED visits in the past 6 months c/o similar symptoms, "voices saying they are going to kill him". At the end of the assessment, the pt asked "is my Congohinese food out there? It's in a bag with a smiley face on it."  Nira ConnJason Berry, NP recommends AM psych [facility] Favoreval. Elaine, RN and Devoria AlbeKnapp, Iva, MD  notified of the recommendation..  Diagnosis: Schizophrenia  Past Medical History:  Past Medical History:  Diagnosis Date  . Homelessness   . Hypercholesterolemia   . Mental disorder   . Paranoid behavior (HCC)   . PE (pulmonary embolism)   . Schizophrenia (HCC)     History reviewed. No pertinent surgical history.  Family History:  Family History  Problem Relation Age of Onset  . Hypertension Mother     Social History:  reports that he has been smoking Cigarettes.  He has been smoking about 0.50 packs per day. He has never used smokeless tobacco. He reports that he drinks alcohol. He reports that he uses drugs, including Marijuana.  Additional Social History:  Alcohol / Drug Use Pain Medications: See MAR Prescriptions: See MAR Over the Counter: See MAR History of alcohol / drug use?: No history of alcohol / drug abuse  CIWA: CIWA-Ar BP: (!) 148/83 Pulse Rate:  88 COWS:    PATIENT STRENGTHS: (choose at least two) Capable of independent living Communication skills  Allergies:  Allergies  Allergen Reactions  . Haldol [Haloperidol] Shortness Of Breath    Home Medications:  (Not in a hospital admission)  OB/GYN Status:  No LMP for male patient.  General Assessment Data Location of Assessment: [facility] TTS Assessment: In system Is this a Tele or Face-to-Face Assessment?: Face-to-Face Is this an Initial Assessment or a Re-assessment for this encounter?: Initial Assessment Marital status: Single Is patient pregnant?: No Pregnancy Status: No Living Arrangements: Alone Can pt return to current living arrangement?: Yes Admission Status: Voluntary Is patient capable of signing voluntary admission?: Yes Referral Source: Self/Family/Friend Insurance type: none     Crisis Care Plan Living Arrangements: Alone Name of Psychiatrist: none Name of Therapist: none  Education Status Is patient currently in school?: No Highest grade of school patient has completed: 12th  Risk to self with the past 6 months Suicidal Ideation: No Has patient been a risk to self within the past 6 months prior to admission? : No Suicidal Intent: No Has patient had any suicidal intent within the past 6 months prior to admission? : No Is patient at risk for suicide?: No Suicidal Plan?: No Has patient had any suicidal plan within the past 6 months prior to admission? : No Access to Means:  No What has been your use of drugs/alcohol within the last 12 months?: denies Previous Attempts/Gestures: No Triggers for Past Attempts: None known Intentional Self Injurious Behavior: None Family Suicide History: No Recent stressful life event(s): Other (Comment) (AH) Persecutory voices/beliefs?: No Depression: No Substance abuse history and/or treatment for substance abuse?: No Suicide prevention information given to non-admitted patients: Not applicable  Risk to Others  within the past 6 months Homicidal Ideation: No Does patient have any lifetime risk of violence toward others beyond the six months prior to admission? : No Thoughts of Harm to Others: No Current Homicidal Intent: No Current Homicidal Plan: No Access to Homicidal Means: No History of harm to others?: No Assessment of Violence: None Noted Does patient have access to weapons?: No Criminal Charges Pending?: No Does patient have a court date: No Is patient on probation?: No  Psychosis Hallucinations: Auditory Delusions: None noted  Mental Status Report Appearance/Hygiene: In scrubs, Poor hygiene Eye Contact: Poor Motor Activity: Freedom of movement Speech: Soft, Slurred Level of Consciousness: Quiet/awake Mood: Euthymic Affect: Flat Anxiety Level: None Thought Processes: Thought Blocking Judgement: Unimpaired Orientation: Person, Place, Time, Situation, Appropriate for developmental age Obsessive Compulsive Thoughts/Behaviors: None  Cognitive Functioning Concentration: Poor Memory: Remote Impaired, Recent Impaired IQ: Average Insight: Good Impulse Control: Good Appetite: Good Sleep: No Change Total Hours of Sleep: 8 Vegetative Symptoms: Not bathing  ADLScreening [facility] Assessment Services) Patient's cognitive ability adequate to safely complete daily activities?: Yes Patient able to express need for assistance with ADLs?: Yes Independently performs ADLs?: Yes (appropriate for developmental age)  Prior Inpatient Therapy Prior Inpatient Therapy: Yes Prior Therapy Dates: 2017 Prior [facility]ilty[facility] Reason for Treatment: SCHIZOPHRENIA   Prior Outpatient Therapy Prior Outpatient Therapy: Yes Prior Therapy Dates: unknown Prior Therapy Facilty/Provider([facility] Reason for Treatment: Schizophrenia Does patient have an ACCT team?: No Does patient have Intensive In-House Services?  : No Does patient have [facility] services? : Yes Does patient have P4CC  services?: No  ADL Screening (condition at time of admission) Patient's cognitive ability adequate to safely complete daily activities?: Yes Is the patient deaf or have difficulty hearing?: No Does the patient have difficulty seeing, even when wearing glasses/contacts?: No Does the patient have difficulty concentrating, remembering, or making decisions?: No Patient able to express need for assistance with ADLs?: Yes Does the patient have difficulty dressing or bathing?: No Independently performs ADLs?: Yes (appropriate for developmental age) Does the patient have difficulty walking or climbing stairs?: No Weakness of Legs: None Weakness of Arms/Hands: None  Home Assistive Devices/Equipment Home Assistive Devices/Equipment: None    Abuse/Neglect Assessment (Assessment to be complete while patient is alone) Physical Abuse: Denies Verbal Abuse: Denies Sexual Abuse: Denies Exploitation of patient/patient's resources: Denies Self-Neglect: Denies     Merchant navy officer (For Healthcare) Does Patient Have a Medical Advance Directive?: No Would patient like information on creating a medical advance directive?: No - Patient declined    Additional Information 1:1 In Past 12 Months?: No CIRT Risk: No Elopement Risk: No Does patient have medical clearance?: Yes     Disposition:  Disposition Initial Assessment Completed for this Encounter: Yes Disposition of Patient: Other dispositions Other disposition(s): Other (Comment) (AM psych eval per [name], NP )  [name] 06/10/2017 5:45 AM

## 2017-06-10 NOTE — ED Notes (Signed)
TTS assessment in progress. 

## 2017-06-10 NOTE — ED Triage Notes (Signed)
Pt stated "I'm hearing voices telling me they're going to kill me."

## 2017-06-10 NOTE — Discharge Instructions (Signed)
For your ongoing mental health needs, you are advised to follow up with [facility].  New and returning patients are seen at their walk-in clinic.  Walk-in hours are Monday - Friday from 8:00 am - 3:00 pm.  Walk-in patients are seen on a first come, first served basis.  Try to arrive as early as possible for he best chance of being seen the same day: ° °     [facility] °     [address] °     [place], [place] [address] °     [phone] °

## 2017-06-10 NOTE — ED Provider Notes (Signed)
[facility]-EMERGENCY DEPT Provider Note   CSN: [id number] Arrival date & time: 06/10/17  0205  By signing my name below, I, Ny'Kea Lewis, attest that this documentation has been prepared under the direction and in the presence of Devoria AlbeKnapp, Eily Louvier, MD. Electronically Signed: Karren CobbleNy'Kea Lewis, ED Scribe. 06/10/17. 4:14 AM.  Time seen 03:10 AM  History   Chief Complaint Chief Complaint  Patient presents with  . Hallucinations    auditory   HPI  HPI Comments: Cole Barrett is a 31 y.o. male with a history of paranoid behavior, schizophrenia, HLD, and mental disorder  who presents to the Emergency Department complaining of gradually worsening, acute on chronic hallucinations that worsened at 12 MN tonight. He notes some associated dizziness. Pt reports he has been hearing voicing saying that "they were going to kill him" he denies that they divulged a plan. He is not currently complaint with his medication, states he ran out the end of last month, and reports he has not been followed for his mental health in over a month. Denies gait abnormalities, he denies suicidal or homicidal ideations.    Past Medical History:  Diagnosis Date  . Homelessness   . Hypercholesterolemia   . Mental disorder   . Paranoid behavior (HCC)   . PE (pulmonary embolism)   . Schizophrenia [facility](HCC)     Patient Active Problem List   Diagnosis Date Noted  . Alcohol abuse   . Auditory hallucinations   . Alcohol abuse with alcohol-induced mood disorder (HCC) 09/08/2016  . Adjustment disorder with depressed mood 09/07/2016  . Homelessness 09/03/2016  . Person feigning illness 09/03/2016  . 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 Cannabis use disorder, mild, abuse 08/29/2016  . Paranoid schizophrenia (HCC) 08/26/2016  . Pulmonary emboli (HCC) 07/13/2016  . 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 Acute left flank pain 01/02/2014  . Pulmonary embolism, bilateral (HCC) 01/02/2014    History reviewed. No  pertinent surgical history.   Home Medications    Prior to Admission medications   Medication Sig Start Date End Date Taking? Authorizing Provider  benztropine (COGENTIN) 1 MG tablet Take 1 tablet (1 mg total) by mouth at bedtime. 04/05/17  Yes Adonis BrookAgustin, Sheila, NP  FLUoxetine (PROZAC) 20 MG capsule Take 1 capsule (20 mg total) by mouth daily. 04/06/17  Yes Adonis BrookAgustin, Sheila, NP  risperiDONE (RISPERDAL) 3 MG tablet Take 1 tablet (3 mg total) by mouth at bedtime. 04/05/17  Yes Adonis BrookAgustin, Sheila, NP  benztropine (COGENTIN) 1 MG tablet Take 1 tablet (1 mg total) by mouth at bedtime. Patient not taking: Reported on 04/05/2017 03/22/17   Charm RingsLord, Jamison Y, NP  FLUoxetine (PROZAC) 20 MG tablet Take 1 tablet (20 mg total) by mouth daily. Patient not taking: Reported on 04/05/2017 03/22/17   Charm RingsLord, Jamison Y, NP  nicotine (NICODERM CQ - DOSED IN MG/24 HOURS) 21 mg/24hr patch Place 1 patch (21 mg total) onto the skin daily. Patient not taking: Reported on 06/10/2017 04/06/17   Adonis BrookAgustin, Sheila, NP  risperiDONE (RISPERDAL) 3 MG tablet Take 1 tablet (3 mg total) by mouth at bedtime. Patient not taking: Reported on 04/05/2017 03/22/17   Charm RingsLord, Jamison Y, NP    Family History Family History  Problem Relation Age of Onset  . Hypertension Mother     Social History Social History  Substance Use Topics  . Smoking status: Current Every Day Smoker    Packs/day: 0.50    Types: Cigarettes  . Smokeless tobacco: Never Used  . Alcohol use  Yes     Comment: socially  states he lives in a house States he lives alone  Allergies   Haldol [haloperidol]  Review of Systems Review of Systems  Musculoskeletal: Negative for gait problem.  Neurological: Positive for dizziness.  Psychiatric/Behavioral: Positive for hallucinations. Negative for suicidal ideas.  All other systems reviewed and are negative.  Physical Exam Updated Vital Signs BP (!) 148/83 (BP Location: Right Arm)   Pulse 88   Temp 98.6 F (37 C) (Oral)   Resp  20   Ht 5\' 10"  (1.778 m)   Wt 220 lb (99.8 kg)   SpO2 98%   BMI 31.57 kg/m   Vital signs normal    Physical Exam  Constitutional: He is oriented to person, place, and time. He appears well-developed and well-nourished.  Non-toxic appearance. He does not appear ill. No distress.  HENT:  Head: Normocephalic and atraumatic.  Right Ear: External ear normal.  Left Ear: External ear normal.  Nose: Nose normal. No mucosal edema or rhinorrhea.  Mouth/Throat: Oropharynx is clear and moist and mucous membranes are normal. No dental abscesses or uvula swelling.  Eyes: Pupils are equal, round, and reactive to light. Conjunctivae and EOM are normal.  Neck: Normal range of motion and full passive range of motion without pain. Neck supple.  Cardiovascular: Normal rate, regular rhythm and normal heart sounds.  Exam reveals no gallop and no friction rub.   No murmur heard. Pulmonary/Chest: Effort normal and breath sounds normal. No respiratory distress. He has no wheezes. He has no rhonchi. He has no rales. He exhibits no tenderness and no crepitus.  Abdominal: Soft. Normal appearance and bowel sounds are normal. He exhibits no distension. There is no tenderness. There is no rebound and no guarding.  Musculoskeletal: Normal range of motion. He exhibits no edema or tenderness.  Moves all extremities well.   Neurological: He is alert and oriented to person, place, and time. He has normal strength. No cranial nerve deficit.  Skin: Skin is warm, dry and intact. No rash noted. No erythema. No pallor.  Psychiatric: He has a normal mood and affect. His speech is normal and behavior is normal. His mood appears not anxious.  Nursing note and vitals reviewed.  ED Treatments / Results  DIAGNOSTIC STUDIES: Oxygen Saturation is 98% on RA, normal by my interpretation.    Labs (all labs ordered are listed, but only abnormal results are displayed) Results for orders placed or performed during the hospital  encounter of 06/10/17  Comprehensive metabolic panel  Result Value Ref Range   Sodium 140 135 - 145 mmol/L   Potassium 3.4 (L) 3.5 - 5.1 mmol/L   Chloride 106 101 - 111 mmol/L   CO2 25 22 - 32 mmol/L   Glucose, Bld 138 (H) 65 - 99 mg/dL   BUN 9 6 - 20 mg/dL   Creatinine, Ser 6.04 0.61 - 1.24 mg/dL   Calcium 8.4 (L) 8.9 - 10.3 mg/dL   Total Protein 6.8 6.5 - 8.1 g/dL   Albumin 3.7 3.5 - 5.0 g/dL   AST 24 15 - 41 U/L   ALT 34 17 - 63 U/L   Alkaline Phosphatase 45 38 - 126 U/L   Total Bilirubin 0.4 0.3 - 1.2 mg/dL   GFR calc non Af Amer >60 >60 mL/min   GFR calc Af Amer >60 >60 mL/min   Anion gap 9 5 - 15  Ethanol  Result Value Ref Range   Alcohol, Ethyl (B) <5 <5 mg/dL  Salicylate level  Result Value Ref Range   Salicylate Lvl <7.0 2.8 - 30.0 mg/dL  Acetaminophen level  Result Value Ref Range   Acetaminophen (Tylenol), Serum <10 (L) 10 - 30 ug/mL  cbc  Result Value Ref Range   WBC 4.0 4.0 - 10.5 K/uL   RBC 4.29 4.22 - 5.81 MIL/uL   Hemoglobin 12.0 (L) 13.0 - 17.0 g/dL   HCT 62.1 (L) 30.8 - 65.7 %   MCV 81.4 78.0 - 100.0 fL   MCH 28.0 26.0 - 34.0 pg   MCHC 34.4 30.0 - 36.0 g/dL   RDW 84.6 96.2 - 95.2 %   Platelets 282 150 - 400 K/uL  Rapid urine drug screen (hospital performed)  Result Value Ref Range   Opiates NONE DETECTED NONE DETECTED   Cocaine NONE DETECTED NONE DETECTED   Benzodiazepines NONE DETECTED NONE DETECTED   Amphetamines NONE DETECTED NONE DETECTED   Tetrahydrocannabinol NONE DETECTED NONE DETECTED   Barbiturates NONE DETECTED NONE DETECTED   Laboratory interpretation all normal except mild anemia    EKG  EKG Interpretation None       Radiology No results found.  Procedures Procedures (including critical care time)  Medications Ordered in ED Medications - No data to display   Initial Impression / Assessment and Plan / ED Course  I have reviewed the triage vital signs and the nursing notes.  Pertinent labs & imaging results that were  available during my care of the patient were reviewed by me and considered in my medical decision making (see chart for details).     COORDINATION OF CARE: 3:50 AM-Discussed next steps with pt. Pt verbalized understanding and is agreeable with the plan.  04:20 AM labs reviewed, TTS consult ordered.  05:44 AM TTS has talked to patient recommend psychiatrist to evaluate in the morning.   Final Clinical Impressions(s) / ED Diagnoses   Final diagnoses:  Hallucinations    Disposition pending  Devoria Albe, MD, FACEP    I personally performed the services described in this documentation, which was scribed in my presence. The recorded information has been reviewed and considered.  Devoria Albe, MD, Concha Pyo, MD 06/10/17 [phone]

## 2017-06-10 NOTE — ED Notes (Signed)
Pt stated "[facility] out [facility] and I ran out of medicine around the 1st part of June."

## 2017-07-10 ENCOUNTER — Encounter (HOSPITAL_COMMUNITY): Payer: Self-pay | Admitting: Emergency Medicine

## 2017-07-10 DIAGNOSIS — R440 Auditory hallucinations: Secondary | ICD-10-CM | POA: Insufficient documentation

## 2017-07-10 NOTE — ED Triage Notes (Signed)
Pt st's he started hearing voices about 1 1/2 hours ago.  Pt denies SI or HI  Just needs to get the voices out of his head.

## 2017-07-11 ENCOUNTER — Emergency Department (HOSPITAL_COMMUNITY)
Admission: EM | Admit: 2017-07-11 | Discharge: 2017-07-11 | Disposition: A | Discharge status: Home / Self Care | Payer: Medicaid Other | Attending: Emergency Medicine | Admitting: Emergency Medicine

## 2017-07-11 DIAGNOSIS — R440 Auditory hallucinations: Secondary | ICD-10-CM | POA: Diagnosis not present

## 2017-07-11 DIAGNOSIS — F1010 Alcohol abuse, uncomplicated: Secondary | ICD-10-CM

## 2017-07-11 DIAGNOSIS — F209 Schizophrenia, unspecified: Secondary | ICD-10-CM | POA: Diagnosis not present

## 2017-07-11 DIAGNOSIS — F17210 Nicotine dependence, cigarettes, uncomplicated: Secondary | ICD-10-CM | POA: Diagnosis not present

## 2017-07-11 DIAGNOSIS — R443 Hallucinations, unspecified: Secondary | ICD-10-CM

## 2017-07-11 DIAGNOSIS — F1210 Cannabis abuse, uncomplicated: Secondary | ICD-10-CM | POA: Diagnosis not present

## 2017-07-11 LAB — RAPID URINE DRUG SCREEN, HOSP PERFORMED
AMPHETAMINES: NOT DETECTED
BARBITURATES: NOT DETECTED
BENZODIAZEPINES: NOT DETECTED
Cocaine: NOT DETECTED
Opiates: NOT DETECTED
TETRAHYDROCANNABINOL: NOT DETECTED

## 2017-07-11 LAB — COMPREHENSIVE METABOLIC PANEL
ALBUMIN: 3.9 g/dL (ref 3.5–5.0)
ALT: 33 U/L (ref 17–63)
AST: 25 U/L (ref 15–41)
Alkaline Phosphatase: 45 U/L (ref 38–126)
Anion gap: 11 (ref 5–15)
BUN: 9 mg/dL (ref 6–20)
CHLORIDE: 106 mmol/L (ref 101–111)
CO2: 22 mmol/L (ref 22–32)
Calcium: 8.6 mg/dL — ABNORMAL LOW (ref 8.9–10.3)
Creatinine, Ser: 0.97 mg/dL (ref 0.61–1.24)
GFR calc Af Amer: >60 mL/min (ref >60)
GFR calc non Af Amer: >60 mL/min (ref >60)
GLUCOSE: 119 mg/dL — AB (ref 65–99)
POTASSIUM: 3.2 mmol/L — AB (ref 3.5–5.1)
SODIUM: 139 mmol/L (ref 135–145)
Total Bilirubin: 0.5 mg/dL (ref 0.3–1.2)
Total Protein: 6.9 g/dL (ref 6.5–8.1)

## 2017-07-11 LAB — CBC WITH DIFFERENTIAL/PLATELET
Basophils Absolute: 0 10*3/uL (ref 0.0–0.1)
Basophils Relative: 1 %
EOS ABS: 0.1 10*3/uL (ref 0.0–0.7)
EOS PCT: 3 %
HCT: 37.6 % — ABNORMAL LOW (ref 39.0–52.0)
Hemoglobin: 12.5 g/dL — ABNORMAL LOW (ref 13.0–17.0)
LYMPHS ABS: 1.4 10*3/uL (ref 0.7–4.0)
Lymphocytes Relative: 33 %
MCH: 27.8 pg (ref 26.0–34.0)
MCHC: 33.2 g/dL (ref 30.0–36.0)
MCV: 83.7 fL (ref 78.0–100.0)
Monocytes Absolute: 0.4 10*3/uL (ref 0.1–1.0)
Monocytes Relative: 8 %
Neutro Abs: 2.5 10*3/uL (ref 1.7–7.7)
Neutrophils Relative %: 55 %
PLATELETS: 263 10*3/uL (ref 150–400)
RBC: 4.49 MIL/uL (ref 4.22–5.81)
RDW: 13.4 % (ref 11.5–15.5)
WBC: 4.4 10*3/uL (ref 4.0–10.5)

## 2017-07-11 LAB — ETHANOL: ALCOHOL ETHYL (B): 116 mg/dL — AB (ref <5)

## 2017-07-11 MED ORDER — LORAZEPAM 2 MG/ML IJ SOLN
0.0000 mg | Freq: Two times a day (BID) | INTRAMUSCULAR | Status: DC
Start: 2017-07-13 — End: 2017-07-11

## 2017-07-11 MED ORDER — VITAMIN B-1 100 MG PO TABS
100.0000 mg | ORAL_TABLET | Freq: Every day | ORAL | Status: DC
  Administered 2017-07-11: 100 mg via ORAL
  Filled 2017-07-11: qty 1

## 2017-07-11 MED ORDER — RISPERIDONE 2 MG PO TABS
2.0000 mg | ORAL_TABLET | Freq: Every day | ORAL | Status: DC
  Administered 2017-07-11: 2 mg via ORAL
  Filled 2017-07-11: qty 1

## 2017-07-11 MED ORDER — BENZTROPINE MESYLATE 1 MG PO TABS
1.0000 mg | ORAL_TABLET | Freq: Every day | ORAL | Status: DC
Start: 2017-07-11 — End: 2017-07-11
  Administered 2017-07-11: 1 mg via ORAL
  Filled 2017-07-11: qty 1

## 2017-07-11 MED ORDER — LORAZEPAM 1 MG PO TABS: 0.0000 mg | ORAL_TABLET | Freq: Four times a day (QID) | ORAL | Status: DC

## 2017-07-11 MED ORDER — THIAMINE HCL 100 MG/ML IJ SOLN: 100.0000 mg | Freq: Every day | INTRAMUSCULAR | Status: DC

## 2017-07-11 MED ORDER — RISPERIDONE 3 MG PO TABS: 3.0000 mg | ORAL_TABLET | Freq: Every day | ORAL | 0 refills | Status: DC

## 2017-07-11 MED ORDER — BENZTROPINE MESYLATE 1 MG PO TABS: 1.0000 mg | ORAL_TABLET | Freq: Every day | ORAL | Status: DC

## 2017-07-11 MED ORDER — RISPERIDONE 2 MG PO TABS: 2.0000 mg | ORAL_TABLET | Freq: Every day | ORAL | Status: DC

## 2017-07-11 MED ORDER — LORAZEPAM 2 MG/ML IJ SOLN: 0.0000 mg | Freq: Four times a day (QID) | INTRAMUSCULAR | Status: DC

## 2017-07-11 MED ORDER — LORAZEPAM 1 MG PO TABS: 0.0000 mg | ORAL_TABLET | Freq: Two times a day (BID) | ORAL | Status: DC

## 2017-07-11 MED ORDER — BENZTROPINE MESYLATE 1 MG PO TABS: 1.0000 mg | ORAL_TABLET | Freq: Every day | ORAL | 0 refills | Status: DC

## 2017-07-11 NOTE — BHH Counselor (Signed)
Began to review notes and hx in preparation for assessment. Currently no PA or MD note in with medical clearance in EPIC. Called and notified pt's nurse Koleen NimrodAdrian. He sts she will contact PA to advise.   Beryle FlockMary Kymber Kosar, MS, CRC, [facility] Triage Specialist [facility]

## 2017-07-11 NOTE — ED Provider Notes (Signed)
[facility]-EMERGENCY DEPT Provider Note   CSN: [id number] Arrival date & time: 07/10/17  2332     History   Chief Complaint Chief Complaint  Patient presents with  . Medical Clearance    HPI Cole Barrett is a 31 y.o. male.  Patient presents to the emergency department with chief complaint of auditory hallucinations. He states that he has been hearing voices this evening which have been telling him to hurt himself. He reports that he has experienced this in the past. He denies any homicidal ideations. He denies any drug or alcohol use. Denies any physical complaints at this time.  There are no modifying factors or other associated symptoms.   The history is provided by the patient. No language interpreter was used.    Past Medical History:  Diagnosis Date  . Homelessness   . Hypercholesterolemia   . Mental disorder   . Paranoid behavior (HCC)   . PE (pulmonary embolism)   . Schizophrenia [facility](HCC)     Patient Active Problem List   Diagnosis Date Noted  . Alcohol abuse   . Auditory hallucinations   . Alcohol abuse with alcohol-induced mood disorder (HCC) 09/08/2016  . Adjustment disorder with depressed mood 09/07/2016  . Homelessness 09/03/2016  . Person feigning illness 09/03/2016  . 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 Cannabis use disorder, mild, abuse 08/29/2016  . Paranoid schizophrenia (HCC) 08/26/2016  . Pulmonary emboli (HCC) 07/13/2016  . 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 Acute left flank pain 01/02/2014  . Pulmonary embolism, bilateral (HCC) 01/02/2014    History reviewed. No pertinent surgical history.     Home Medications    Prior to Admission medications   Medication Sig Start Date End Date Taking? Authorizing Provider  benztropine (COGENTIN) 1 MG tablet Take 1 tablet (1 mg total) by mouth at bedtime. 06/10/17  Yes Charm RingsLord, Jamison Y, NP  FLUoxetine (PROZAC) 20 MG tablet Take 1 tablet (20 mg total) by mouth daily. 06/10/17   Yes Charm RingsLord, Jamison Y, NP  risperiDONE (RISPERDAL) 3 MG tablet Take 1 tablet (3 mg total) by mouth at bedtime. 06/10/17  Yes Charm RingsLord, Jamison Y, NP  benztropine (COGENTIN) 1 MG tablet Take 1 tablet (1 mg total) by mouth at bedtime. Patient not taking: Reported on 07/11/2017 04/05/17   Adonis BrookAgustin, Sheila, NP  FLUoxetine (PROZAC) 20 MG capsule Take 1 capsule (20 mg total) by mouth daily. Patient not taking: Reported on 07/11/2017 04/06/17   Adonis BrookAgustin, Sheila, NP  nicotine (NICODERM CQ - DOSED IN MG/24 HOURS) 21 mg/24hr patch Place 1 patch (21 mg total) onto the skin daily. Patient not taking: Reported on 06/10/2017 04/06/17   Adonis BrookAgustin, Sheila, NP  risperiDONE (RISPERDAL) 3 MG tablet Take 1 tablet (3 mg total) by mouth at bedtime. Patient not taking: Reported on 07/11/2017 04/05/17   Adonis BrookAgustin, Sheila, NP    Family History Family History  Problem Relation Age of Onset  . Hypertension Mother     Social History Social History  Substance Use Topics  . Smoking status: Current Every Day Smoker    Packs/day: 0.50    Types: Cigarettes  . Smokeless tobacco: Never Used  . Alcohol use Yes     Comment: socially     Allergies   Haldol [haloperidol]   Review of Systems Review of Systems  All other systems reviewed and are negative.    Physical Exam Updated Vital Signs BP 135/75 (BP Location: Left Arm)   Pulse 97   Temp 98.6 F (37 C) (  Oral)   Resp 16   Ht 5\' 10"  (1.778 m)   Wt 99.8 kg (220 lb)   SpO2 98%   BMI 31.57 kg/m   Physical Exam  Constitutional: He is oriented to person, place, and time. He appears well-developed and well-nourished.  HENT:  Head: Normocephalic and atraumatic.  Eyes: Pupils are equal, round, and reactive to light. Conjunctivae and EOM are normal. Right eye exhibits no discharge. Left eye exhibits no discharge. No scleral icterus.  Neck: Normal range of motion. Neck supple. No JVD present.  Cardiovascular: Normal rate, regular rhythm and normal heart sounds.  Exam reveals no  gallop and no friction rub.   No murmur heard. Pulmonary/Chest: Effort normal and breath sounds normal. No respiratory distress. He has no wheezes. He has no rales. He exhibits no tenderness.  Abdominal: Soft. He exhibits no distension and no mass. There is no tenderness. There is no rebound and no guarding.  Musculoskeletal: Normal range of motion. He exhibits no edema or tenderness.  Neurological: He is alert and oriented to person, place, and time.  Skin: Skin is warm and dry.  Psychiatric: He has a normal mood and affect. His behavior is normal. Judgment and thought content normal.  Nursing note and vitals reviewed.    ED Treatments / Results  Labs (all labs ordered are listed, but only abnormal results are displayed) Labs Reviewed  COMPREHENSIVE METABOLIC PANEL - Abnormal; Notable for the following:       Result Value   Potassium 3.2 (*)    Glucose, Bld 119 (*)    Calcium 8.6 (*)    All other components within normal limits  ETHANOL - Abnormal; Notable for the following:    Alcohol, Ethyl (B) 116 (*)    All other components within normal limits  CBC WITH DIFFERENTIAL/PLATELET - Abnormal; Notable for the following:    Hemoglobin 12.5 (*)    HCT 37.6 (*)    All other components within normal limits  RAPID URINE DRUG SCREEN, HOSP PERFORMED    EKG  EKG Interpretation None       Radiology No results found.  Procedures Procedures (including critical care time)  Medications Ordered in ED Medications  LORazepam (ATIVAN) injection 0-4 mg (not administered)    Or  LORazepam (ATIVAN) tablet 0-4 mg (not administered)  LORazepam (ATIVAN) injection 0-4 mg (not administered)    Or  LORazepam (ATIVAN) tablet 0-4 mg (not administered)  thiamine (VITAMIN B-1) tablet 100 mg (not administered)    Or  thiamine (B-1) injection 100 mg (not administered)     Initial Impression / Assessment and Plan / ED Course  I have reviewed the triage vital signs and the nursing  notes.  Pertinent labs & imaging results that were available during my care of the patient were reviewed by me and considered in my medical decision making (see chart for details).     Patient with auditory hallucinations.  Hears voices that tell him to harm himself.  Denies alcohol or drug use, but ETOH is mildly elevated.  Patient has been assessed and is medically clear for behavioral health assessment.  Final Clinical Impressions(s) / ED Diagnoses   Final diagnoses:  None    New Prescriptions New Prescriptions   No medications on file     Roxy Horseman, Cordelia Poche 07/11/17 [id number]    Zadie Rhine, MD 07/11/17 [phone]

## 2017-07-11 NOTE — ED Notes (Signed)
TTS machine at bedside. 

## 2017-07-11 NOTE — Progress Notes (Signed)
CSW spoke with Riley LamJustina O., NP who recommends d/c and for the pt to [facility]. Tobi BastosAnna, RN and EDP at [phone] notified of the recommendation.  Princess BruinsAquicha Erlinda Solinger, MSW, LCSWA TTS Specialist [phone]

## 2017-07-11 NOTE — Consult Note (Signed)
Telepsych Consultation   Reason for Consult:  Auditory hallucinations Referring Physician: EDP Patient Identification: Cole Barrett MRN:  [id number] Principal Diagnosis: <principal problem not specified> Diagnosis:   Patient Active Problem List   Diagnosis Date Noted  . Alcohol abuse [F10.10]   . Auditory hallucinations [R44.0]   . Alcohol abuse with alcohol-induced mood disorder ([place]) [F10.14] 09/08/2016  . Adjustment disorder with depressed mood [F43.21] 09/07/2016  . Homelessness [Z59.0] 09/03/2016  . Person feigning illness [Z76.5] 09/03/2016  . 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 Cannabis use disorder, mild, abuse [F12.10] 08/29/2016  . Paranoid schizophrenia ([place]) [F20.0] 08/26/2016  . Pulmonary emboli ([place]) [I26.99] 07/13/2016  . Pulmonary embolism and infarction ([place]) [I26.99] 01/02/2014  . Acute left flank pain [R10.9] 01/02/2014  . Pulmonary embolism, bilateral ([place]) [I26.99] 01/02/2014    Total Time spent with patient: 30 minutes  Subjective:   Cole Barrett is a 31 y.o. male patient admitted with Schizophrenia by hx; Alcohol Use D/O, Moderate; Cannabis Use D/O, Mild.  HPI: Per the assessment completed 07/11/17 by Faylene Kurtz: Cole Barrett is an 31 y.o. male who came voluntarily to the [facility], voices that he says urge him to kill others. Pt sts he will never act on the voices and he has been haering them "for a long time...years." Pt denies SI, HI, SHI and VH. Pt has been to the ED many times (17 times in 2018) c/o similar sx. Pt has stated that he is homeless previously but sts that he "has a place to stay now." Pt is possibly malingering for shelter. Pt has been assisted in connecting with OP providers on several occasions but pt has not followed up. Pt sts he contacted [facility] about 4-5 months ago but did not go back and has not been in communication with them since. Pt sts he is not taking any psychiatric medications  at this time but sts he wants help with the voices he hears. Pt sts he understands that taking medications will help lesson or eliminate the voices but, pt seems disinterested. Per pt hx, pt has had multiple arrests for minor charges such as drug possession, urinating in public and expired registration. Pt denies any violent crimes or hx of physical aggression. Pt denies access to guns or weapons. Pt has been psychiat[facility] to [facility] 2 times in 2017 but with of his ED visits was not found to require IP admission. Pt denies a hx of abuse.  Pt sts he uses cannabis once every 2 months, drinks alcohol 4-5 times per week (2- 40 oz beers) and smokes a pack of cigarettes every 2 months. Pt BAL was 116 when tested and he tested negative to all other substances in the ED tonight. Pt sts he is sleeping 8 hours per night and eating regularly with no significant changes in weight. Pt denies all symptoms of depression and anxiety. Pt sts he is happy.   Pt was dressed in appropriate, colorful, modest street clothes and sitting on his hospital bed. Pt was alert, cooperative and polite. Pt kept fair eye contact, spoke in a clear tone and at a quick pace burt without pressured speech. Pt moved in a normal manner when moving. Pt's thought process was coherent and relevant and judgement was partially impaired.  No indication of delusional thinking or response to internal stimuli. Pt's mood was stated as neither depressed or anxious and his relaxed, smiling affect was congruent.  Pt was oriented x 4, to  person, place, time and situation.    On Exam: Patient was seen via tele-psych, chart reviewed with treatment team. Patient in bed, awake, alert and oriented x4. Patient reiterated the reason for this hospital admission as documented above. Patient stated, "I came to the hospital because I was hearing these voices and they got bad. The voices are telling me to kill myself". Patient stated but today, they voices are  better. Patient currently denies any SI/HI/VH, he stated that he wants to get back taking his medications because he ran out for a while. Patient stated that he hasn't been to [facility] in a while but is willing to return there for their services. Patient endorsing THC use once in a while and stated the last time he used it was yesterday. He was unable to see the corelation between worsening hallucination and drug use. Patient stated he has a M-F, 8a-5p job and lives alone. Patient does not appear to be responding to internal stimuli nor does he display any sign of paranoia.  Past Psychiatric History: See H&P  Risk to Self: Suicidal Ideation: No Suicidal Intent: No Is patient at risk for suicide?: No Suicidal Plan?: No Access to Means: No What has been your use of drugs/alcohol within the last 12 months?:  (OCCASIONAL USE PER PT) Triggers for Past Attempts: None known Intentional Self Injurious Behavior: None Risk to Others: Homicidal Ideation: No Thoughts of Harm to Others: No Current Homicidal Intent: No Current Homicidal Plan: No Access to Homicidal Means: No Identified Victim:  (NONE) History of harm to others?: No Assessment of Violence: None Noted Violent Behavior Description:  (NA) Does patient have access to weapons?: No (DENIES) Criminal Charges Pending?: No Does patient have a court date: No Prior Inpatient Therapy: Prior Inpatient Therapy: Yes Prior Therapy Dates:  (2 X 2017) Prior Therapy Facilty/Provider(s):  ([facility]) Reason for Treatment:  (SCHIZOPHRENIA) Prior Outpatient Therapy: Prior Outpatient Therapy: Yes Prior Therapy Dates:  (UNKNOWN) Prior Therapy Facilty/Provider(s):  (UNKNOWN) Reason for Treatment:  (SCHIZOPHRENIA) Does patient have an ACCT team?: No Does patient have Intensive In-House Services?  : No Does patient have [facility] services? : No (LAST SEEN PER PT 4-5 MONTHS AGO W/O COMMUNICATION) Does patient have [facility] services?: No  Past Medical History:   Past Medical History:  Diagnosis Date  . Homelessness   . Hypercholesterolemia   . Mental disorder   . Paranoid behavior ([place])   . PE (pulmonary embolism)   . Schizophrenia ([place])    History reviewed. No pertinent surgical history. Family History:  Family History  Problem Relation Age of Onset  . Hypertension Mother    Family Psychiatric  History:   Social History:  History  Alcohol Use  . Yes    Comment: socially     History  Drug Use  . Types: Marijuana    Comment: Once every 2-3 months, Last used: Last month    Social History   Social History  . Marital status: Single    Spouse name: N/A  . Number of children: N/A  . Years of education: N/A   Social History Main Topics  . Smoking status: Current Every Day Smoker    Packs/day: 0.50    Types: Cigarettes  . Smokeless tobacco: Never Used  . Alcohol use Yes     Comment: socially  . Drug use: Yes    Types: Marijuana     Comment: Once every 2-3 months, Last used: Last month  . Sexual activity: No   Other  Topics Concern  . None   Social History Narrative  . None   Additional Social History:    Allergies:   Allergies  Allergen Reactions  . Haldol [Haloperidol] Shortness Of Breath    Labs:  Results for orders placed or performed during the hospital encounter of 07/11/17 (from the past 48 hour(s))  Comprehensive metabolic panel     Status: Abnormal   Collection Time: 07/10/17 11:42 PM  Result Value Ref Range   Sodium 139 135 - 145 mmol/L   Potassium 3.2 (L) 3.5 - 5.1 mmol/L   Chloride 106 101 - 111 mmol/L   CO2 22 22 - 32 mmol/L   Glucose, Bld 119 (H) 65 - 99 mg/dL   BUN 9 6 - 20 mg/dL   Creatinine, Ser 4.69 0.61 - 1.24 mg/dL   Calcium 8.6 (L) 8.9 - 10.3 mg/dL   Total Protein 6.9 6.5 - 8.1 g/dL   Albumin 3.9 3.5 - 5.0 g/dL   AST 25 15 - 41 U/L   ALT 33 17 - 63 U/L   Alkaline Phosphatase 45 38 - 126 U/L   Total Bilirubin 0.5 0.3 - 1.2 mg/dL   GFR calc non Af Amer >60 >60 mL/min   GFR calc Af  Amer >60 >60 mL/min    Comment: (NOTE) The eGFR has been calculated using the CKD EPI equation. This calculation has not been validated in all clinical situations. eGFR's persistently <60 mL/min signify possible Chronic Kidney Disease.    Anion gap 11 5 - 15  Ethanol     Status: Abnormal   Collection Time: 07/10/17 11:42 PM  Result Value Ref Range   Alcohol, Ethyl (B) 116 (H) <5 mg/dL    Comment:        LOWEST DETECTABLE LIMIT FOR SERUM ALCOHOL IS 5 mg/dL FOR MEDICAL PURPOSES ONLY   CBC with Diff     Status: Abnormal   Collection Time: 07/10/17 11:42 PM  Result Value Ref Range   WBC 4.4 4.0 - 10.5 K/uL   RBC 4.49 4.22 - 5.81 MIL/uL   Hemoglobin 12.5 (L) 13.0 - 17.0 g/dL   HCT 50.7 (L) 22.5 - 75.0 %   MCV 83.7 78.0 - 100.0 fL   MCH 27.8 26.0 - 34.0 pg   MCHC 33.2 30.0 - 36.0 g/dL   RDW 51.8 33.5 - 82.5 %   Platelets 263 150 - 400 K/uL   Neutrophils Relative % 55 %   Neutro Abs 2.5 1.7 - 7.7 K/uL   Lymphocytes Relative 33 %   Lymphs Abs 1.4 0.7 - 4.0 K/uL   Monocytes Relative 8 %   Monocytes Absolute 0.4 0.1 - 1.0 K/uL   Eosinophils Relative 3 %   Eosinophils Absolute 0.1 0.0 - 0.7 K/uL   Basophils Relative 1 %   Basophils Absolute 0.0 0.0 - 0.1 K/uL  Urine rapid drug screen (hosp performed)     Status: None   Collection Time: 07/10/17 11:43 PM  Result Value Ref Range   Opiates NONE DETECTED NONE DETECTED   Cocaine NONE DETECTED NONE DETECTED   Benzodiazepines NONE DETECTED NONE DETECTED   Amphetamines NONE DETECTED NONE DETECTED   Tetrahydrocannabinol NONE DETECTED NONE DETECTED   Barbiturates NONE DETECTED NONE DETECTED    Comment:        DRUG SCREEN FOR MEDICAL PURPOSES ONLY.  IF CONFIRMATION IS NEEDED FOR ANY PURPOSE, NOTIFY LAB WITHIN 5 DAYS.        LOWEST DETECTABLE LIMITS FOR URINE DRUG SCREEN Drug  Class       Cutoff (ng/mL) Amphetamine      1000 Barbiturate      200 Benzodiazepine   200 Tricyclics       300 Opiates          300 Cocaine           300 THC              50     Current Facility-Administered Medications  Medication Dose Route Frequency Provider Last Rate Last Dose  . benztropine (COGENTIN) tablet 1 mg  1 mg Oral QHS Okonkwo, Justina A, NP      . LORazepam (ATIVAN) injection 0-4 mg  0-4 mg Intravenous Q6H Roxy Horseman, PA-C       Or  . LORazepam (ATIVAN) tablet 0-4 mg  0-4 mg Oral Q6H Roxy Horseman, PA-C      . [START ON 07/13/2017] LORazepam (ATIVAN) injection 0-4 mg  0-4 mg Intravenous Q12H Roxy Horseman, PA-C       Or  . Melene Muller ON 07/13/2017] LORazepam (ATIVAN) tablet 0-4 mg  0-4 mg Oral Q12H Roxy Horseman, PA-C      . risperiDONE (RISPERDAL) tablet 2 mg  2 mg Oral QHS Okonkwo, Justina A, NP      . thiamine (VITAMIN B-1) tablet 100 mg  100 mg Oral Daily Roxy Horseman, PA-C       Or  . thiamine (B-1) injection 100 mg  100 mg Intravenous Daily Roxy Horseman, PA-C       Current Outpatient Prescriptions  Medication Sig Dispense Refill  . benztropine (COGENTIN) 1 MG tablet Take 1 tablet (1 mg total) by mouth at bedtime. 30 tablet 0  . FLUoxetine (PROZAC) 20 MG tablet Take 1 tablet (20 mg total) by mouth daily. 30 tablet 0  . risperiDONE (RISPERDAL) 3 MG tablet Take 1 tablet (3 mg total) by mouth at bedtime. 30 tablet 0  . benztropine (COGENTIN) 1 MG tablet Take 1 tablet (1 mg total) by mouth at bedtime. (Patient not taking: Reported on 07/11/2017) 30 tablet 0  . FLUoxetine (PROZAC) 20 MG capsule Take 1 capsule (20 mg total) by mouth daily. (Patient not taking: Reported on 07/11/2017) 30 capsule 0  . nicotine (NICODERM CQ - DOSED IN MG/24 HOURS) 21 mg/24hr patch Place 1 patch (21 mg total) onto the skin daily. (Patient not taking: Reported on 06/10/2017) 28 patch 0  . risperiDONE (RISPERDAL) 3 MG tablet Take 1 tablet (3 mg total) by mouth at bedtime. (Patient not taking: Reported on 07/11/2017) 30 tablet 0    Musculoskeletal: UTA via camera  Psychiatric Specialty Exam: Physical Exam  Nursing note and  vitals reviewed.   Review of Systems  Psychiatric/Behavioral: Positive for hallucinations (stable for discharge) and substance abuse. Negative for depression, memory loss and suicidal ideas. The patient is not nervous/anxious and does not have insomnia.   All other systems reviewed and are negative.   Blood pressure 122/76, pulse 77, temperature 98.6 F (37 C), temperature source Oral, resp. rate 16, height 5\' 10"  (1.778 m), weight 99.8 kg (220 lb), SpO2 98 %.Body mass index is 31.57 kg/m.  General Appearance: Casual  Eye Contact:  Good  Speech:  Clear and Coherent and Normal Rate  Volume:  Normal  Mood:  Euthymic  Affect:  Appropriate and Congruent  Thought Process:  Coherent and Goal Directed  Orientation:  Full (Time, Place, and Person)  Thought Content:  WDL and Logical  Suicidal Thoughts:  No  Homicidal Thoughts:  No  Memory:  Immediate;   Good Recent;   Good Remote;   Fair  Judgement:  Intact  Insight:  Present  Psychomotor Activity:  Normal  Concentration:  Concentration: Good and Attention Span: Good  Recall:  Good  Fund of Knowledge:  Good  Language:  Good  Akathisia:  Negative  Handed:  Right  AIMS (if indicated):     Assets:  Communication Skills Desire for Improvement Financial Resources/Insurance Housing Leisure Time Physical Health Resilience Social Support  ADL's:  Intact  Cognition:  WNL  Sleep:        Treatment Plan Summary: Plan to discharge home with 7 day worth of prescription for Risperdal and Cogentin  Follow up with [facility] for therapy and medication management Follow up with Social Work consult for Care coordination Take all medications as prescribed Avoid the use of alcohol and/or drugs Stay well hydrated Activity as tolerated Follow up with PCP for any new or existing medical concerns   Disposition: No evidence of imminent risk to self or others at present.   Patient does not meet criteria for  psychiatric inpatient admission. Supportive therapy provided about ongoing stressors. Refer to IOP. Discussed crisis plan, support from social network, calling 911, coming to the Emergency Department, and calling Suicide Hotline.  Vicenta Aly, NP 07/11/2017 9:52 AM

## 2017-07-11 NOTE — BH Assessment (Addendum)
Tele Assessment Note   Cole Barrett is an 31 y.o. male who came voluntarily to the [facility] tonight c/o AH, voices that he says urge him to kill others. Pt sts he will never act on the voices and he has been haering them "for a long time...years." Pt denies SI, HI, SHI and VH. Pt has been to the ED many times (17 times in 2018) c/o similar sx. Pt has stated that he is homeless previously but sts that he "has a place to stay now." Pt is possibly malingering for shelter. Pt has been assisted in connecting with OP providers on several occasions but pt has not followed up. Pt sts he contacted [facility] about 4-5 months ago but did not go back and has not been in communication with them since. Pt sts he is not taking any psychiatric medications at this time but sts he wants help with the voices he hears. Pt sts he understands that taking medications will help lesson or eliminate the voices but, pt seems disinterested. Per pt hx, pt has had multiple arrests for minor charges such as drug possession, urinating in public and expired registration. Pt denies any violent crimes or hx of physical aggression. Pt denies access to guns or weapons. Pt has been psychiatrically admitted to [facility] 2 times in 2017 but with of his ED visits was not found to require IP admission. Pt denies a hx of abuse.  Pt sts he uses cannabis once every 2 months, drinks alcohol 4-5 times per week (2- 40 oz beers) and smokes a pack of cigarettes every 2 months. Pt BAL was 116 when tested and he tested negative to all other substances in the ED tonight. Pt sts he is sleeping 8 hours per night and eating regularly with no significant changes in weight. Pt denies all symptoms of depression and anxiety. Pt sts he is happy.   Pt was dressed in appropriate, colorful, modest street clothes and sitting on his hospital bed. Pt was alert, cooperative and polite. Pt kept fair eye contact, spoke in a clear tone and at a quick pace burt without  pressured speech. Pt moved in a normal manner when moving. Pt's thought process was coherent and relevant and judgement was partially impaired.  No indication of delusional thinking or response to internal stimuli. Pt's mood was stated as neither depressed or anxious and his relaxed, smiling affect was congruent.  Pt was oriented x 4, to person, place, time and situation.   Diagnosis: Schizophrenia by hx; Alcohol Use D/O, Moderate; Cannabis Use D/O, Mild  Past Medical History:  Past Medical History:  Diagnosis Date  . Homelessness   . Hypercholesterolemia   . Mental disorder   . Paranoid behavior (HCC)   . PE (pulmonary embolism)   . Schizophrenia (HCC)     History reviewed. No pertinent surgical history.  Family History:  Family History  Problem Relation Age of Onset  . Hypertension Mother     Social History:  reports that he has been smoking Cigarettes.  He has been smoking about 0.50 packs per day. He has never used smokeless tobacco. He reports that he drinks alcohol. He reports that he uses drugs, including Marijuana.  Additional Social History:  Alcohol / Drug Use Prescriptions: STS NOT CURRENTLY PRESCRIBED ANY PSYCH MEDICATIONS History of alcohol / drug use?: Yes Longest period of sobriety (when/how long): UNKNOWN Substance #1 Name of Substance 1: ALCOHOL 1 - Age of First Use: TEENS 1 - Amount (size/oz): 2-  40 OZ BEERS 1 - Frequency: 4-5 TIMES PER WEEK 1 - Duration: ONGOING 1 - Last Use / Amount: 07/10/17 - BAL TESTED AT 116 IN THE ED TONIGHT Substance #2 Name of Substance 2: CANNABIS 2 - Age of First Use: TEENS 2 - Amount (size/oz): VARIES 2 - Frequency: 1 TIME EVERY 2 MONTHS 2 - Duration: ONGOING 2 - Last Use / Amount: 1 MONTH AGO Substance #3 Name of Substance 3: NICOTINE/CIGARETTES 3 - Age of First Use: TEENS 3 - Amount (size/oz): 1 PACK 3 - Frequency: EVERY 2 MONTHS 3 - Duration: ONGOING 3 - Last Use / Amount: THIS WEEKEND  CIWA: CIWA-Ar BP:  135/75 Pulse Rate: 97 Nausea and Vomiting: no nausea and no vomiting Tactile Disturbances: none Tremor: no tremor Auditory Disturbances: mild harshness or ability to frighten Paroxysmal Sweats: no sweat visible Visual Disturbances: not present Anxiety: no anxiety, at ease Headache, Fullness in Head: none present Agitation: normal activity Orientation and Clouding of Sensorium: oriented and can do serial additions CIWA-Ar Total: 2 COWS: Clinical Opiate Withdrawal Scale (COWS) Resting Pulse Rate: Pulse Rate 80 or below Sweating: No report of chills or flushing Restlessness: Able to sit still Pupil Size: Pupils pinned or normal size for room light Bone or Joint Aches: Not present Runny Nose or Tearing: Not present GI Upset: No GI symptoms Tremor: No tremor Yawning: No yawning Anxiety or Irritability: None Gooseflesh Skin: Skin is smooth COWS Total Score: 0  PATIENT STRENGTHS: (choose at least two) Average or above average intelligence Capable of independent living Communication skills  Allergies:  Allergies  Allergen Reactions  . Haldol [Haloperidol] Shortness Of Breath    Home Medications:  (Not in a hospital admission)  OB/GYN Status:  No LMP for male patient.  General Assessment Data Location of Assessment: [facility] ED TTS Assessment: In system Is this a Tele or Face-to-Face Assessment?: Tele Assessment Is this an Initial Assessment or a Re-assessment for this encounter?: Initial Assessment Marital status: Single Is patient pregnant?: No Pregnancy Status: No Living Arrangements: Alone Can pt return to current living arrangement?: Yes Admission Status: Voluntary Is patient capable of signing voluntary admission?: Yes Referral Source: Self/Family/Friend Insurance type:  (SELF PAY)     Crisis Care Plan Living Arrangements: Alone Name of Psychiatrist:  (STS SEEN AT [facility] ABOUT 4-5 MONTHS AGO) Name of Therapist:  (NONE)  Education Status Is patient currently  in school?: No Highest grade of school patient has completed:  (12)  Risk to self with the past 6 months Suicidal Ideation: No Has patient been a risk to self within the past 6 months prior to admission? : No Suicidal Intent: No Has patient had any suicidal intent within the past 6 months prior to admission? : No Is patient at risk for suicide?: No Suicidal Plan?: No Has patient had any suicidal plan within the past 6 months prior to admission? : No Access to Means: No What has been your use of drugs/alcohol within the last 12 months?:  (OCCASIONAL USE PER PT) Previous Attempts/Gestures: No Triggers for Past Attempts: None known Intentional Self Injurious Behavior: None Family Suicide History: No Recent stressful life event(s):  (AH) Persecutory voices/beliefs?: No Depression: No Depression Symptoms:  (DENIES ALL SYMPTOMS) Substance abuse history and/or treatment for substance abuse?: No Suicide prevention information given to non-admitted patients: Not applicable  Risk to Others within the past 6 months Homicidal Ideation: No Does patient have any lifetime risk of violence toward others beyond the six months prior to admission? : No  Thoughts of Harm to Others: No Current Homicidal Intent: No Current Homicidal Plan: No Access to Homicidal Means: No Identified Victim:  (NONE) History of harm to others?: No Assessment of Violence: None Noted Violent Behavior Description:  (NA) Does patient have access to weapons?: No (DENIES) Criminal Charges Pending?: No Does patient have a court date: No Is patient on probation?: No  Psychosis Hallucinations: Auditory Delusions: None noted  Mental Status Report Appearance/Hygiene: Disheveled Eye Contact: Fair Motor Activity: Freedom of movement Speech: Logical/coherent Level of Consciousness: Alert Mood: Euthymic Affect: Inconsistent with thought content Anxiety Level: None Thought Processes: Coherent, Relevant Judgement:  Partial Orientation: Person, Place, Time, Situation Obsessive Compulsive Thoughts/Behaviors: None  Cognitive Functioning Concentration: Decreased Memory: Recent Intact, Remote Intact IQ: Average Insight: Good Impulse Control: Good Appetite: Good Sleep: No Change Total Hours of Sleep:  (8) Vegetative Symptoms: Not bathing  ADLScreening [facility] Assessment Services) Patient's cognitive ability adequate to safely complete daily activities?: Yes Patient able to express need for assistance with ADLs?: Yes Independently performs ADLs?: Yes (appropriate for developmental age)  Prior Inpatient Therapy Prior Inpatient Therapy: Yes Prior Therapy Dates:  (2 X 2017) Prior Therapy Facilty/Provider(s):  ([facility]) Reason for Treatment:  (SCHIZOPHRENIA)  Prior Outpatient Therapy Prior Outpatient Therapy: Yes Prior Therapy Dates:  (UNKNOWN) Prior Therapy Facilty/Provider(s):  (UNKNOWN) Reason for Treatment:  (SCHIZOPHRENIA) Does patient have an ACCT team?: No Does patient have Intensive In-House Services?  : No Does patient have [facility] services? : No (LAST SEEN PER PT 4-5 MONTHS AGO W/O COMMUNICATION) Does patient have P4CC services?: No  ADL Screening (condition at time of admission) Patient's cognitive ability adequate to safely complete daily activities?: Yes Patient able to express need for assistance with ADLs?: Yes Independently performs ADLs?: Yes (appropriate for developmental age)       Abuse/Neglect Assessment (Assessment to be complete while patient is alone) Physical Abuse: Denies Verbal Abuse: Denies Sexual Abuse: Denies Exploitation of patient/patient's resources: Denies Self-Neglect: Denies     Merchant navy officerAdvance Directives (For Healthcare) Does Patient Have a Medical Advance Directive?: No Would patient like information on creating a medical advance directive?: No - Patient declined    Additional Information 1:1 In Past 12 Months?: No CIRT Risk: No Elopement Risk:  No Does patient have medical clearance?: Yes     Disposition:  Disposition Initial Assessment Completed for this Encounter: Yes Disposition of Patient: Other dispositions Other disposition(s): Other (Comment) (PENDING REVIEW W [facility] EXTENDER)  Recommend observation overnight & re-evaluation later this morning for possible discharge per Donell SievertSpencer Simon, PA.  Spoke to OGE Energyob Browning, [place] [facility] and advised of recommendation. He agreed.   Beryle FlockMary Deniss Wormley, MS, CRC, [facility] Triage Specialist [facility] Jeraldine Primeau T 07/11/2017 6:11 AM

## 2017-08-06 DIAGNOSIS — F4321 Adjustment disorder with depressed mood: Secondary | ICD-10-CM | POA: Insufficient documentation

## 2017-08-06 DIAGNOSIS — F17210 Nicotine dependence, cigarettes, uncomplicated: Secondary | ICD-10-CM | POA: Insufficient documentation

## 2017-08-06 DIAGNOSIS — Z79899 Other long term (current) drug therapy: Secondary | ICD-10-CM | POA: Insufficient documentation

## 2017-08-07 ENCOUNTER — Encounter (HOSPITAL_COMMUNITY): Payer: Self-pay | Admitting: Emergency Medicine

## 2017-08-07 ENCOUNTER — Emergency Department (HOSPITAL_COMMUNITY)
Admission: EM | Admit: 2017-08-07 | Discharge: 2017-08-07 | Disposition: A | Discharge status: Home / Self Care | Payer: Self-pay | Attending: Emergency Medicine | Admitting: Emergency Medicine

## 2017-08-07 DIAGNOSIS — F4321 Adjustment disorder with depressed mood: Secondary | ICD-10-CM

## 2017-08-07 DIAGNOSIS — R440 Auditory hallucinations: Secondary | ICD-10-CM

## 2017-08-07 LAB — CBC
HCT: 38.9 % — ABNORMAL LOW (ref 39.0–52.0)
HEMOGLOBIN: 13.5 g/dL (ref 13.0–17.0)
MCH: 28.6 pg (ref 26.0–34.0)
MCHC: 34.7 g/dL (ref 30.0–36.0)
MCV: 82.4 fL (ref 78.0–100.0)
PLATELETS: 310 10*3/uL (ref 150–400)
RBC: 4.72 MIL/uL (ref 4.22–5.81)
RDW: 13.1 % (ref 11.5–15.5)
WBC: 5.2 10*3/uL (ref 4.0–10.5)

## 2017-08-07 LAB — COMPREHENSIVE METABOLIC PANEL
ALT: 38 U/L (ref 17–63)
AST: 30 U/L (ref 15–41)
Albumin: 4.4 g/dL (ref 3.5–5.0)
Alkaline Phosphatase: 53 U/L (ref 38–126)
Anion gap: 11 (ref 5–15)
BUN: 13 mg/dL (ref 6–20)
CHLORIDE: 102 mmol/L (ref 101–111)
CO2: 25 mmol/L (ref 22–32)
CREATININE: 1 mg/dL (ref 0.61–1.24)
Calcium: 9.1 mg/dL (ref 8.9–10.3)
GFR calc Af Amer: >60 mL/min (ref >60)
Glucose, Bld: 121 mg/dL — ABNORMAL HIGH (ref 65–99)
POTASSIUM: 3.5 mmol/L (ref 3.5–5.1)
SODIUM: 138 mmol/L (ref 135–145)
Total Bilirubin: 0.4 mg/dL (ref 0.3–1.2)
Total Protein: 7.8 g/dL (ref 6.5–8.1)

## 2017-08-07 LAB — RAPID URINE DRUG SCREEN, HOSP PERFORMED
AMPHETAMINES: NOT DETECTED
Barbiturates: NOT DETECTED
Benzodiazepines: NOT DETECTED
Cocaine: NOT DETECTED
OPIATES: NOT DETECTED
TETRAHYDROCANNABINOL: NOT DETECTED

## 2017-08-07 LAB — ACETAMINOPHEN LEVEL: Acetaminophen (Tylenol), Serum: <10 ug/mL — ABNORMAL LOW (ref 10–30)

## 2017-08-07 LAB — ETHANOL

## 2017-08-07 LAB — SALICYLATE LEVEL

## 2017-08-07 NOTE — BH Assessment (Signed)
B[facility] Assessment Progress Note  Per Mojeed Akintayo, MD, this pt does not require psychiatric hospitalization at this time.  [facility] is to be [facility] from [facility] with recommendation to follow up with [facility].  This has been included in pt's discharge instructions.  Pt's nurse has been notified.  Jaymar Loeber, MA Triage Specialist [phone]     

## 2017-08-07 NOTE — BHH Suicide Risk Assessment (Signed)
Suicide Risk Assessment  Discharge Assessment   [facility]   Principal Problem: Adjustment disorder with depressed mood Discharge Diagnoses:  Patient Active Problem List   Diagnosis Date Noted  . Adjustment disorder with depressed mood [F43.21] 09/07/2016    Priority: High  . Homelessness [Z59.0] 09/03/2016    Priority: High  . Person feigning illness [Z76.5] 09/03/2016    Priority: High  . Alcohol abuse with alcohol-induced mood disorder (HCC) [F10.14] 09/08/2016    Priority: Low  . Paranoid schizophrenia (HCC) [F20.0] 08/26/2016    Priority: Low  . Alcohol abuse [F10.10]   . Auditory hallucinations [R44.0]   . 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 Cannabis use disorder, mild, abuse [F12.10] 08/29/2016  . Pulmonary emboli (HCC) [I26.99] 07/13/2016  . Pulmonary embolism and infarction (HCC) [I26.99] 01/02/2014  . Acute left flank pain [R10.9] 01/02/2014  . Pulmonary embolism, bilateral (HCC) [I26.99] 01/02/2014    Total Time spent with patient: 45 minutes  Musculoskeletal: Strength & Muscle Tone: within normal limits Gait & Station: normal Patient leans: N/A  Psychiatric Specialty Exam:   Blood pressure 131/76, pulse 75, temperature 98.4 F (36.9 C), temperature source Oral, resp. rate 18, height 5\' 10"  (1.778 m), weight 99.8 kg (220 lb), SpO2 98 %.Body mass index is 31.57 kg/m.  General Appearance: Casual  Eye Contact::  Good  Speech:  Normal Rate  Volume:  Normal  Mood:  Euthymic  Affect:  Congruent  Thought Process:  Coherent and Descriptions of Associations: Intact  Orientation:  Full (Time, Place, and Person)  Thought Content:  WDL and Logical  Suicidal Thoughts:  No  Homicidal Thoughts:  No  Memory:  Immediate;   Good Recent;   Good Remote;   Good  Judgement:  Fair  Insight:  Fair  Psychomotor Activity:  Normal  Concentration:  Good  Recall:  Good  Fund of Knowledge:Fair  Language: Good  Akathisia:  No  Handed:   Right  AIMS (if indicated):     Assets:  Leisure Time Physical Health Resilience Social Support  Sleep:     Cognition: WNL  ADL's:  Intact   Mental Status Per Nursing Assessment::   On Admission:   31 yo male who presented to the ED wanting Rx [facility]  These providers know him very well and communicated to him that he needs to see the provider there for prescriptions, discharged with his plan to go there.  No suicidal/homicidal ideations, hallucinations, and substance abuse.  Stable for discharge.  Demographic Factors:  Male  Loss Factors: NA  Historical Factors: NA  Risk Reduction Factors:   Sense of responsibility to family, Positive social support and Positive therapeutic relationship  Continued Clinical Symptoms:  None   Cognitive Features That Contribute To Risk:  None    Suicide Risk:  Minimal: No identifiable suicidal ideation.  Patients presenting with no risk factors but with morbid ruminations; may be classified as minimal risk based on the severity of the depressive symptoms    Plan Of Care/Follow-up recommendations:  Activity:  as tolerated Diet:  heart healthy diet  LORD, JAMISON, NP 08/07/2017, 10:17 AM

## 2017-08-07 NOTE — ED Notes (Signed)
Returned pt's belongings 

## 2017-08-07 NOTE — BH Assessment (Addendum)
Assessment Note  Cole HawkingChristopher Adam Barrett is an 31 y.o. male, who [facility] This is the pt's twelfth ED visit this year for a similar presentation. Clinician asked the pt, "what brought you to the hospital?" Pt reported, "problems with voices in my head." Pt reported, wanting help with the voices. Pt reported, he has been hearing voices for a couple of years. Pt reported, the voices tell him they're going to kill him. Pt denied, SI, HI, self-injurious behaviors and access to weapons.   Pt denied abuse and substance use. Pt's UDS is negative. Pt reported, previous inpatient admissions.   Pt presents sleeping in scrubs with logical/coherent speech. Pt's eye contact was poor. Pt's mood was helpless. Pt's affect was flat. Pt's thought process was relevant. Pt's judgement was partial. Pt's concentration was normal. Pt's insight was poor. Pt's impulse control was fair. Pt [facility], [facility] could [facility] for safety. Pt reported, if inpatient treatment was recommended he would sign-in voluntarily.   Diagnosis: Schizophrenia [facility](HCC)   Past Medical History:  Past Medical History:  Diagnosis Date  . Homelessness   . Hypercholesterolemia   . Mental disorder   . Paranoid behavior (HCC)   . PE (pulmonary embolism)   . Schizophrenia (HCC)     History reviewed. No pertinent surgical history.  Family History:  Family History  Problem Relation Age of Onset  . Hypertension Mother     Social History:  reports that he has been smoking Cigarettes.  He has been smoking about 0.50 packs per day. He has never used smokeless tobacco. He reports that he drinks alcohol. He reports that he uses drugs, including Marijuana.  Additional Social History:  Alcohol / Drug Use Pain Medications: See MAR Prescriptions: See MAR Over the Counter: See MAR History of alcohol / drug use?: No history of alcohol / drug abuse (Pt's UDS is negative. )  CIWA: CIWA-Ar BP:  138/81 Pulse Rate: 81 COWS:    Allergies:  Allergies  Allergen Reactions  . Haldol [Haloperidol] Shortness Of Breath    Home Medications:  (Not in a hospital admission)  OB/GYN Status:  No LMP for male patient.  General Assessment Data Location of Assessment: [facility] TTS Assessment: In system Is this a Tele or Face-to-Face Assessment?: Face-to-Face Is this an Initial Assessment or a Re-assessment for this encounter?: Initial Assessment Marital status: Single Living Arrangements: Alone Can pt return to current living arrangement?: Yes Admission Status: Voluntary Is patient capable of signing voluntary admission?: Yes Referral Source: Self/Family/Friend Insurance type: Self-pay     Crisis Care Plan Living Arrangements: Alone Legal Guardian: Other: (Self) Name of Psychiatrist: NA Name of Therapist: NA  Education Status Is patient currently in school?: No Current Grade: NA Highest grade of school patient has completed: 12th grade.  Name of school: NA Contact person: NA  Risk to self with the past 6 months Suicidal Ideation: No (Pt denies. ) Has patient been a risk to self within the past 6 months prior to admission? : No Suicidal Intent: No Has patient had any suicidal intent within the past 6 months prior to admission? : No Is patient at risk for suicide?: No Suicidal Plan?: No Has patient had any suicidal plan within the past 6 months prior to admission? : No Access to Means: No What has been your use of drugs/alcohol within the last 12 months?: Pt's UDS is negative.  Previous Attempts/Gestures: No How many times?: 0 Other Self Harm Risks: Pt  denies.  Triggers for Past Attempts: None known Intentional Self Injurious Behavior: None (Pt denies. ) Family Suicide History: No Recent stressful life event(s): Other (Comment) (Pt reported, hearing voices. ) Persecutory voices/beliefs?: No Depression: No (Pt denies.) Depression Symptoms:  (Pt denies. ) Substance  abuse history and/or treatment for substance abuse?: No Suicide prevention information given to non-admitted patients: Not applicable  Risk to Others within the past 6 months Homicidal Ideation: No (Pt denies. ) Does patient have any lifetime risk of violence toward others beyond the six months prior to admission? : No Thoughts of Harm to Others: No Current Homicidal Intent: No Current Homicidal Plan: No Access to Homicidal Means: No Identified Victim: NA History of harm to others?: No Assessment of Violence: None Noted Violent Behavior Description: NA Does patient have access to weapons?: No (Pt denies. ) Criminal Charges Pending?: No Does patient have a court date: No Is patient on probation?: No  Psychosis Hallucinations: Auditory Delusions: None noted  Mental Status Report Appearance/Hygiene: In scrubs Eye Contact: Poor Motor Activity: Unremarkable Speech: Logical/coherent Level of Consciousness: Sleeping Affect: Flat Anxiety Level: None Thought Processes: Relevant Judgement: Partial Orientation: Other (Comment) (year, city and state. ) Obsessive Compulsive Thoughts/Behaviors: None  Cognitive Functioning Concentration: Normal Memory: Recent Intact IQ: Average Insight: Poor Impulse Control: Fair Appetite: Fair Sleep: Decreased Total Hours of Sleep:  (Pt reported, 4-5 hours.) Vegetative Symptoms: None  ADLScreening [facility] Assessment Services) Patient's cognitive ability adequate to safely complete daily activities?: Yes Patient able to express need for assistance with ADLs?: Yes Independently performs ADLs?: Yes (appropriate for developmental age)  Prior Inpatient Therapy Prior Inpatient Therapy: Yes Prior Therapy Dates: Per chart, 2017. Prior Therapy Facilty/Provider(s): Per chart, [facility]. Reason for Treatment: Per chart, schizophrenia.   Prior Outpatient Therapy Prior Outpatient Therapy: No (UTA) Prior Therapy Dates: UTA Prior Therapy  Facilty/Provider(s): UTA Reason for Treatment: UTA Does patient have an ACCT team?: Unknown Does patient have Intensive In-House Services?  : Unknown Does patient have [facility] services? : Unknown Does patient have P4CC services?: Unknown  ADL Screening (condition at time of admission) Patient's cognitive ability adequate to safely complete daily activities?: Yes Is the patient deaf or have difficulty hearing?: No Does the patient have difficulty seeing, even when wearing glasses/contacts?: No Does the patient have difficulty concentrating, remembering, or making decisions?: Yes Patient able to express need for assistance with ADLs?: Yes Does the patient have difficulty dressing or bathing?: No Independently performs ADLs?: Yes (appropriate for developmental age) Does the patient have difficulty walking or climbing stairs?: No Weakness of Legs: None Weakness of Arms/Hands: None       Abuse/Neglect Assessment (Assessment to be complete while patient is alone) Physical Abuse: Denies (Pt denies. ) Verbal Abuse: Denies (Pt denies.) Sexual Abuse: Denies (Pt denies.) Exploitation of patient/patient's resources: Denies (Pt denies. ) Self-Neglect: Denies (Pt denies. )     Advance Directives (For Healthcare) Does Patient Have a Medical Advance Directive?: No    Additional Information 1:1 In Past 12 Months?: No CIRT Risk: No Elopement Risk: No Does patient have medical clearance?: Yes     Disposition: [name], NP recommends overnight observation and re-evaluation in the morning. Disposition discussed with Dr, Wilkie Aye and Camelia Eng, Charge Nurse.    Disposition Initial Assessment Completed for this Encounter: Yes Disposition of Patient: Other dispositions (AM Psychiatric Evaluation. ) Other disposition(s): Other (Comment) (AM Psychiatric Evaluation. )  On Site Evaluation by:   Reviewed with Physician:  Dr. Wilkie Aye and [name], NP.  Redmond Pulling 08/07/2017 6:10 AM    Redmond Pulling, MS, [facility] Triage Specialist [phone]

## 2017-08-07 NOTE — ED Provider Notes (Signed)
[facility]-EM[facility]Y DEPT Provider Note   CSN: [id number] Arrival date & time: 08/06/17  2237     History   Chief Complaint Chief Complaint  Patient presents with  . Hallucinations    HPI Cole Barrett is a 31 y.o. male.  Patient here for evaluation and treatment of auditory hallucinations. He reports similar symptoms in the past. He has been off medications for the past 2 months. No SI/HI. He states the voices are telling him to kill himself. No physical complaints.    The history is provided by the patient. No language interpreter was used.    Past Medical History:  Diagnosis Date  . Homelessness   . Hypercholesterolemia   . Mental disorder   . Paranoid behavior (HCC)   . PE (pulmonary embolism)   . Schizophrenia [facility](HCC)     Patient Active Problem List   Diagnosis Date Noted  . Alcohol abuse   . Auditory hallucinations   . Alcohol abuse with alcohol-induced mood disorder (HCC) 09/08/2016  . Adjustment disorder with depressed mood 09/07/2016  . Homelessness 09/03/2016  . Person feigning illness 09/03/2016  . 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 Cannabis use disorder, mild, abuse 08/29/2016  . Paranoid schizophrenia (HCC) 08/26/2016  . Pulmonary emboli (HCC) 07/13/2016  . 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 Acute left flank pain 01/02/2014  . Pulmonary embolism, bilateral (HCC) 01/02/2014    History reviewed. No pertinent surgical history.     Home Medications    Prior to Admission medications   Medication Sig Start Date End Date Taking? Authorizing Provider  benztropine (COGENTIN) 1 MG tablet Take 1 tablet (1 mg total) by mouth at bedtime. 07/11/17  Yes Raeford RazorKohut, Stephen, MD  FLUoxetine (PROZAC) 20 MG tablet Take 1 tablet (20 mg total) by mouth daily. 06/10/17  Yes Charm RingsLord, Jamison Y, NP  risperiDONE (RISPERDAL) 3 MG tablet Take 1 tablet (3 mg total) by mouth at bedtime. 07/11/17  Yes Raeford RazorKohut, Stephen, MD  nicotine (NICODERM CQ - DOSED IN  MG/24 HOURS) 21 mg/24hr patch Place 1 patch (21 mg total) onto the skin daily. Patient not taking: Reported on 06/10/2017 04/06/17   Adonis BrookAgustin, Sheila, NP    Family History Family History  Problem Relation Age of Onset  . Hypertension Mother     Social History Social History  Substance Use Topics  . Smoking status: Current Every Day Smoker    Packs/day: 0.50    Types: Cigarettes  . Smokeless tobacco: Never Used  . Alcohol use Yes     Comment: socially     Allergies   Haldol [haloperidol]   Review of Systems Review of Systems  Constitutional: Negative for chills and fever.  HENT: Negative.   Respiratory: Negative.   Cardiovascular: Negative.   Gastrointestinal: Negative.   Musculoskeletal: Negative.   Skin: Negative.   Neurological: Negative.   Psychiatric/Behavioral: Positive for hallucinations.     Physical Exam Updated Vital Signs BP 138/81 (BP Location: Left Arm)   Pulse 81   Temp 98.4 F (36.9 C) (Oral)   Resp 16   SpO2 99%   Physical Exam  Constitutional: He appears well-developed and well-nourished.  HENT:  Head: Normocephalic.  Neck: Normal range of motion. Neck supple.  Cardiovascular: Normal rate and regular rhythm.   Pulmonary/Chest: Effort normal and breath sounds normal.  Abdominal: Soft. Bowel sounds are normal. There is no tenderness. There is no rebound and no guarding.  Musculoskeletal: Normal range of motion.  Neurological: He is alert. No cranial  nerve deficit.  Skin: Skin is warm and dry. No rash noted.  Psychiatric: His speech is normal. His affect is blunt. He is actively hallucinating. He expresses no suicidal ideation. He expresses no suicidal plans.     ED Treatments / Results  Labs (all labs ordered are listed, but only abnormal results are displayed) Labs Reviewed  COMPREHENSIVE METABOLIC PANEL - Abnormal; Notable for the following:       Result Value   Glucose, Bld 121 (*)    All other components within normal limits  CBC  - Abnormal; Notable for the following:    HCT 38.9 (*)    All other components within normal limits  RAPID URINE DRUG SCREEN, HOSP PERFORMED  ETHANOL  SALICYLATE LEVEL  ACETAMINOPHEN LEVEL    EKG  EKG Interpretation None       Radiology No results found.  Procedures Procedures (including critical care time)  Medications Ordered in ED Medications - No data to display   Initial Impression / Assessment and Plan / ED Course  I have reviewed the triage vital signs and the nursing notes.  Pertinent labs & imaging results that were available during my care of the patient were reviewed by me and considered in my medical decision making (see chart for details).     Patient here with c/o hallucinations, hearing voices that are telling him to kill himself. He denies SI/HI. TTS consultation will be requested to determine disposition.  Final Clinical Impressions(s) / ED Diagnoses   Final diagnoses:  None   1. Auditory hallucinations  New Prescriptions New Prescriptions   No medications on file     Elpidio Anis, Cordelia Poche 08/07/17 [id number]    Shon Baton, MD 08/08/17 [phone]

## 2017-08-07 NOTE — ED Triage Notes (Signed)
Pt comes in with complaints of auditory hallucinations. States they are telling him they are going to kill him.  Denies SI/HI. Ambulatory.  A&O x4. Been seen before for the same.

## 2017-08-07 NOTE — ED Notes (Signed)
Bed: [facility]3 Expected date:  Expected time:  Means of arrival:  Comments: 

## 2017-08-07 NOTE — ED Notes (Signed)
Psychiatry at bedside.

## 2017-08-07 NOTE — Discharge Instructions (Signed)
For your ongoing mental health needs, you are advised to follow up with [facility].  New and returning patients are seen at their walk-in clinic.  Walk-in hours are Monday - Friday from 8:00 am - 3:00 pm.  Walk-in patients are seen on a first come, first served basis.  Try to arrive as early as possible for he best chance of being seen the same day: ° °     [facility] °     [address] °     [place], [place] [address] °     [phone] °

## 2017-08-10 ENCOUNTER — Encounter (HOSPITAL_COMMUNITY): Payer: Self-pay | Admitting: *Deleted

## 2017-08-10 DIAGNOSIS — F17210 Nicotine dependence, cigarettes, uncomplicated: Secondary | ICD-10-CM | POA: Insufficient documentation

## 2017-08-10 DIAGNOSIS — F209 Schizophrenia, unspecified: Secondary | ICD-10-CM | POA: Insufficient documentation

## 2017-08-10 DIAGNOSIS — Z79899 Other long term (current) drug therapy: Secondary | ICD-10-CM | POA: Insufficient documentation

## 2017-08-10 LAB — CBC
HCT: 37.3 % — ABNORMAL LOW (ref 39.0–52.0)
Hemoglobin: 12.3 g/dL — ABNORMAL LOW (ref 13.0–17.0)
MCH: 27.7 pg (ref 26.0–34.0)
MCHC: 33 g/dL (ref 30.0–36.0)
MCV: 84 fL (ref 78.0–100.0)
PLATELETS: 298 10*3/uL (ref 150–400)
RBC: 4.44 MIL/uL (ref 4.22–5.81)
RDW: 13.4 % (ref 11.5–15.5)
WBC: 3.7 10*3/uL — AB (ref 4.0–10.5)

## 2017-08-10 LAB — COMPREHENSIVE METABOLIC PANEL
ALK PHOS: 47 U/L (ref 38–126)
ALT: 36 U/L (ref 17–63)
ANION GAP: 9 (ref 5–15)
AST: 24 U/L (ref 15–41)
Albumin: 3.9 g/dL (ref 3.5–5.0)
BILIRUBIN TOTAL: 0.5 mg/dL (ref 0.3–1.2)
BUN: 9 mg/dL (ref 6–20)
CALCIUM: 8.5 mg/dL — AB (ref 8.9–10.3)
CO2: 22 mmol/L (ref 22–32)
Chloride: 108 mmol/L (ref 101–111)
Creatinine, Ser: 0.96 mg/dL (ref 0.61–1.24)
Glucose, Bld: 103 mg/dL — ABNORMAL HIGH (ref 65–99)
Potassium: 3.5 mmol/L (ref 3.5–5.1)
Sodium: 139 mmol/L (ref 135–145)
TOTAL PROTEIN: 6.9 g/dL (ref 6.5–8.1)

## 2017-08-10 LAB — ETHANOL: ALCOHOL ETHYL (B): 68 mg/dL — AB (ref <5)

## 2017-08-10 NOTE — ED Notes (Signed)
Last alcohol and drugs two months

## 2017-08-10 NOTE — ED Triage Notes (Signed)
Pt here for hearing voices  He says he has had the voices for 1-2 years  He was here 3 days ago for the same.  Very poor hygiene .

## 2017-08-11 ENCOUNTER — Emergency Department (HOSPITAL_COMMUNITY)
Admission: EM | Admit: 2017-08-11 | Discharge: 2017-08-11 | Disposition: A | Discharge status: Home / Self Care | Payer: Self-pay | Attending: Emergency Medicine | Admitting: Emergency Medicine

## 2017-08-11 DIAGNOSIS — R443 Hallucinations, unspecified: Secondary | ICD-10-CM

## 2017-08-11 LAB — RAPID URINE DRUG SCREEN, HOSP PERFORMED
AMPHETAMINES: NOT DETECTED
Barbiturates: NOT DETECTED
Benzodiazepines: NOT DETECTED
Cocaine: NOT DETECTED
OPIATES: NOT DETECTED
TETRAHYDROCANNABINOL: NOT DETECTED

## 2017-08-11 MED ORDER — RISPERIDONE 3 MG PO TABS: 3.0000 mg | ORAL_TABLET | Freq: Every day | ORAL | Status: DC

## 2017-08-11 MED ORDER — FLUOXETINE HCL 20 MG PO CAPS
20.0000 mg | ORAL_CAPSULE | Freq: Every day | ORAL | Status: DC
  Filled 2017-08-11: qty 1

## 2017-08-11 MED ORDER — BENZTROPINE MESYLATE 1 MG PO TABS: 1.0000 mg | ORAL_TABLET | Freq: Every day | ORAL | Status: DC

## 2017-08-11 NOTE — ED Notes (Signed)
Patients personal belongings and clothes given to pt. With his discharge papers.

## 2017-08-11 NOTE — Discharge Instructions (Signed)
Substance Abuse Treatment Programs ° °Intensive Outpatient Programs °[facility]     °[address]      °[place], [place]                   °[phone]      ° °[facility] °[address] °[place], [place] °[phone] ° °[facility]     °(Inpatient and outpatient)     °[address]           °[phone]   ° °[facility] °[phone] (Suboxone and Methadone) ° °[address]      °[place], [place] [address]      °336-882-2125      ° °[address] °[place], [place] °852-3033 ° °[facility] (Outpatient/Inpatient, Chemical)    °(insurance only) [phone]      °       °[facility] (Groups & Residential) °[place], [place] °336-389-1413 ° °   °[facility]     °[address]     °[place], [place]      °[phone]      ° °[facility] (for caregivers and family) °[address] °[place], [place] °336-299-4655 ° ° ° ° ° °Residential Treatment Programs °[facility]      °[address], [place], [place] [address]  °[phone]      ° °[facility] °[address] [place], [place] [address] °[phone] ° °[facility]        °336-248-8914      ° °[facility] °1-800-659-3381 ° °[facility]             °[address]                                         °[place], [place]                                                °877-615-2722 or 336-784-9470                              ° °[facility] [place] °[address] °[place], [address] °1.877.941.8954 ° °[facility]    °[address]      °[place], [place]     °[phone]      ° °[facility] °[address] °[place], [place] °336-285-9073 ° °[facility]   °[address]     °[place], [place] [address]     °[phone]      °Admissions: 8am-3pm M-F ° °[facility] °[address] °[place],  [place] °336-227-7417 ° °[facility]: Residential Program (90 Days)   °[place], [place]      °336-725-8389 or 800-758-6077    ° °[facility] °[place], [place] °(Walk in Hours over the weekend or by referral) ° °[facility] °[address], [place],  [address] °([phone] ° °Crisis Mobile: [facility]:  1-877-626-1772 (for crisis response 24 hours a day) °[facility] Hotline:      1-800-256-2452 °Outpatient Psychiatry and Counseling ° °[facility]: Mobile Crisis   Management 24 hours:  1-877-626-1772 ° °[facility] sliding scale fee and walk in schedule: M-F 8am-12pm/1pm-3pm °[address]  °[place], [place] [address] °[phone] ° °[facility] °[address] °[place], [place] [address] °336-703-9650 ° °[facility] ([facility]ly known [name])- new patient walk-in appointments available Monday - Friday 8am -3pm.          °[address] °[place], [place] [address] °336-676-6840 or crisis line- 336-676-6905 ° °[facility]/ Intensive Outpatient Therapy Program °[address] °[place], [place] [address] °336-832-9804 ° °[name]                  °Crisis Services      °[phone]      °[address]     °[place], [place] [address]                ° °[facility]   °[facility] °[phone] °[address] °[place], [place] [address] ° ° °[facility]          °[address],  °[place], [place] [address]       °[phone] ° °[facility] °[address] °[place], [place] [address] °336-292-1510 ° °[facility]    °[address]    °[place], [place] [address]     °336-632-3505      ° °Parish McKinney, MD     °[address]     °[place] [address]     °336-282-1251     °  °[facility] °[address] °[place] [address] ° °[facility]     °[address]     °[place], [place]      °336-542-2076      ° °[facility] °Shamsher Ahluwalia, MD °[address] °[place], [place] [address] °336-420-9558 ° °[facility]     °[address]     °[place], [place] [address]     °336-274-1237      ° °[facility] °[address] °[place], [place] [address] °336-854-4450 °Resources for Temporary Residential Assistance/Crisis Centers ° °DAY CENTERS °[facility] °M-F 8am-3pm   °[address] [place], [place] [address]   336-332-0824 °Services include: laundry, barbering, support groups, case management, phone  & computer access, showers, AA/NA mtgs, mental health/substance abuse nurse, job skills class, disability information, VA assistance, spiritual classes, etc.  ° °HOMELESS SHELTERS ° °[facility]     °[facility]   °[address], [place]     °[phone]       °       °[facility] (women and children)       °[name] °[place], [place] [address] °336-275-0820 °[email or web address] for application and process °Application Required ° °[facility]   °[address]    °[place] [address]     °[phone]       °             °[facility] °[address] °[place], [place] [address] °336.273.5572 °336-235-0363(schedule application appt.) °Application Required ° °[facility] (women only)    °[address]     °[place],  [address]     °336-884-1039      °  Intake starts 6pm daily °Need valid ID, SSC, & Police report °[facility] [place] °[address] °[place], [place] °336-881-5420 °Application Required ° °[facility] (men only)     °[address]      °[place], [place]     °336.748.1962      ° °[facility] °(Pregnant women only) °[address] °[place], [place] °336-275-0206 ° °[facility]      °[address]      °[place], [place] [address]     °[phone]      °       °[facility] [facility] °[address] °[place], [place] °336-723-1848 °90 day commitment/SA/Application process ° °[facility](men only)     °[address]     °[place], [place]     °336-748-1962       °Check-in at 7pm     °       °[facility] °[address] °[place], [place] [address] °336-248-6684 °Men/Women/Women and Children must be there by 7 pm ° °[facility] °[place], [place] °336-722-8721                ° °

## 2017-08-11 NOTE — BH Assessment (Signed)
Tele Assessment Note   Patient Name: Cole Barrett MRN: [id number] Referring Physician: Zadie Rhine, MD Location of Patient: [facility] Location of Provider: Behavioral Health TTS Department  Cole Barrett is an 31 y.o.single male, who voluntarily came into [facility]. Patient reported ongoing auditory hallucinations stating "They going to kill me."  Patient stated that he contacted [facility] services and told to come to ED, if the voices, worsened.  Per medical labs, /dL on 78/29/5621.  Patient reported use of Alcohol on 08/09/2017, however reported no consistent use.  UDS labs pending and Patient reports no use of any other substance.   Patient's ethanol level was Patient denies suicidal ideations, homicidal ideations, visual hallucinations, self-injurious behaviors, or access to weapons.  Patient denies any current depressive symptoms.    Patient reported currently residing alone.  When asked about current stressors, Patient reported, "Nothing much."  Patient denies arrests, probation/parole, or upcoming court dates.  Patient reported no history of physical, sexual, verbal abuse. Patient reported no family history of suicide and substance abuse.  Patient stated receiving inpatient treatment at [facility]. Patient reported calling [facility] after discharge to ED, but not scheduling an appointment.  Patient stated that he is currently receiving no outpatient services from any provider.    During assessment, Patient was calm and cooperative. Patient was dressed in scrubs and sitting in his bed.  Patient was oriented to time, person, location, and situation.  Patient's eye contact was fair.  Patient's motor activity consisted of freedom of movement.  Patient's speech was logical and coherent.  Patient's level of consciousness was alert. Patient's mood appeared to be pleasant. Patient's affect was flat.  Patient's thought process was coherent and  relevant.  Patient's judgment appeared to be unimpaired.     Diagnosis: Schizophrenia, per medical History  Past Medical History:  Past Medical History:  Diagnosis Date  . Homelessness   . Hypercholesterolemia   . Mental disorder   . Paranoid behavior (HCC)   . PE (pulmonary embolism)   . Schizophrenia (HCC)     History reviewed. No pertinent surgical history.  Family History:  Family History  Problem Relation Age of Onset  . Hypertension Mother     Social History:  reports that he has been smoking Cigarettes.  He has been smoking about 0.50 packs per day. He has never used smokeless tobacco. He reports that he drinks alcohol. He reports that he uses drugs, including Marijuana.  Additional Social History:     CIWA: CIWA-Ar BP: 120/73 Pulse Rate: 92 COWS:    PATIENT STRENGTHS: (choose at least two) Ability for insight Average or above average intelligence Capable of independent living Communication skills General fund of knowledge Motivation for treatment/growth  Allergies:  Allergies  Allergen Reactions  . Haldol [Haloperidol] Shortness Of Breath    Home Medications:  (Not in a hospital admission)  OB/GYN Status:  No LMP for male patient.  General Assessment Data Location of Assessment: [facility] ED TTS Assessment: In system Is this a Tele or Face-to-Face Assessment?: Face-to-Face Is this an Initial Assessment or a Re-assessment for this encounter?: Initial Assessment Marital status: Single Is patient pregnant?: No Pregnancy Status: No Living Arrangements: Alone Can pt return to current living arrangement?: Yes Admission Status: Voluntary Is patient capable of signing voluntary admission?: Yes Referral Source: Self/Family/Friend Insurance type: None     Crisis Care Plan Living Arrangements: Alone Legal Guardian: Other: (Self) Name of Psychiatrist: None Name of Therapist: None  Education  Status Is patient currently in school?: No Current Grade:  N/A Highest grade of school patient has completed: 12th grade.  Name of school: N/A Contact person: N/A  Risk to self with the past 6 months Suicidal Ideation: No (Patient denies.) Has patient been a risk to self within the past 6 months prior to admission? : No Suicidal Intent: No Has patient had any suicidal intent within the past 6 months prior to admission? : No Is patient at risk for suicide?: No Suicidal Plan?: No Has patient had any suicidal plan within the past 6 months prior to admission? : No Access to Means: No What has been your use of drugs/alcohol within the last 12 months?: Alcohol Previous Attempts/Gestures: No How many times?: 0 Other Self Harm Risks: Patient denies. Triggers for Past Attempts: None known Intentional Self Injurious Behavior: None Family Suicide History: No Recent stressful life event(s): Other (Comment) (Patient reports auditory hallucinations experiences.) Persecutory voices/beliefs?: No Depression: No Substance abuse history and/or treatment for substance abuse?: No Suicide prevention information given to non-admitted patients: Not applicable  Risk to Others within the past 6 months Homicidal Ideation: No Does patient have any lifetime risk of violence toward others beyond the six months prior to admission? : No Thoughts of Harm to Others: No Current Homicidal Intent: No Current Homicidal Plan: No Access to Homicidal Means: No Identified Victim: Patient denies. History of harm to others?: No Assessment of Violence: None Noted Violent Behavior Description: Patient denies. Does patient have access to weapons?: No Criminal Charges Pending?: No Does patient have a court date: No Is patient on probation?: No  Psychosis Hallucinations: Auditory Delusions: None noted  Mental Status Report Appearance/Hygiene: In scrubs Eye Contact: Fair Motor Activity: Freedom of movement Speech: Logical/coherent Level of Consciousness: Alert Mood:  Pleasant Affect: Flat Anxiety Level: None Thought Processes: Coherent, Relevant Judgement: Unimpaired Orientation: Person, Place, Time, Situation Obsessive Compulsive Thoughts/Behaviors: None  Cognitive Functioning Concentration: Good Memory: Remote Intact, Recent Intact IQ: Average Insight: Fair Impulse Control: Fair Appetite: Good Weight Loss: 0 Weight Gain: 0 Sleep: No Change Total Hours of Sleep:  (Patient stated, "I get a good amount of sleep.") Vegetative Symptoms: None  ADLScreening ([facility] adequate to safely complete daily activities?: Yes Patient able to express need for assistance with ADLs?: Yes Independently performs ADLs?: Yes (appropriate for developmental age)  Prior Inpatient Therapy Prior Inpatient Therapy: Yes Prior Therapy Dates: Per chart, 2017. Prior Therapy Facilty/Provider(s): Pt. reports [facility] Reason for Treatment: Per chart, schizophrenia.   Prior Outpatient Therapy Prior Outpatient Therapy: No Prior Therapy Dates: None Prior Therapy Facilty/Provider(s): None Reason for Treatment: None Does patient have an ACCT team?: No Does patient have Intensive In-House Services?  : No Does patient have [facility] services? : Yes Does patient have [facility] services?: No  ADL Screening (condition at time of admission) Patient's cognitive ability adequate to safely complete daily activities?: Yes Is the patient deaf or have difficulty hearing?: No Does the patient have difficulty seeing, even when wearing glasses/contacts?: No Does the patient have difficulty concentrating, remembering, or making decisions?: No Patient able to express need for assistance with ADLs?: Yes Does the patient have difficulty dressing or bathing?: No Independently performs ADLs?: Yes (appropriate for developmental age) Does the patient have difficulty walking or climbing stairs?: No Weakness of Legs: None Weakness of Arms/Hands:  None  Home Assistive Devices/Equipment Home Assistive Devices/Equipment: None    Abuse/Neglect Assessment (Assessment to be complete while patient is alone) Physical Abuse:  Denies Verbal Abuse: Denies Sexual Abuse: Denies Exploitation of patient/patient's resources: Denies Self-Neglect: Denies     Merchant navy officer (For Healthcare) Does Patient Have a Medical Advance Directive?: No Would patient like information on creating a medical advance directive?: No - Patient declined    Additional Information 1:1 In Past 12 Months?: No CIRT Risk: No Elopement Risk: No Does patient have medical clearance?: Yes     Disposition:  Disposition Initial Assessment Completed for this Encounter: Yes Disposition of Patient: Outpatient treatment (Per [name], NP) Other disposition(s): Information only, Referred to outside facility (Outpatient resources and follow up with [facility])  This service was provided via telemedicine using a 2-way, interactive audio and video technology.   Talbert Nan 08/11/2017 5:10 AM

## 2017-08-11 NOTE — ED Notes (Signed)
Patient ambulatory to the room, no distress noted at this time.

## 2017-08-11 NOTE — ED Notes (Addendum)
Paper scrubs given to pt. , security notified to wand pt.  

## 2017-08-11 NOTE — ED Provider Notes (Signed)
[facility] DEPT Provider Note   CSN: [id number] Arrival date & time: 08/10/17  2131     History   Chief Complaint Chief Complaint  Patient presents with  . Psychiatric Evaluation    HPI Cole Barrett is a 31 y.o. male.  The history is provided by the patient.  Mental Health Problem  Presenting symptoms: hallucinations   Presenting symptoms: no suicide attempt   Degree of incapacity (severity):  Moderate Onset quality:  Gradual Timing:  Constant Progression:  Worsening Chronicity:  Recurrent Context: alcohol use   Relieved by:  Nothing Worsened by:  Nothing Associated symptoms: no abdominal pain, no chest pain and no headaches   Pt with reported h/o schizophrenia presents with hallucinations He reports auditory hallucinations, voices stating they will kill him He denies actual SI He has no other complaints He reports it has been worsening over past several days  No fever/vomiting No HA No chest pain No other acute complaints  Past Medical History:  Diagnosis Date  . Homelessness   . Hypercholesterolemia   . Mental disorder   . Paranoid behavior (HCC)   . PE (pulmonary embolism)   . Schizophrenia [facility])     Patient Active Problem List   Diagnosis Date Noted  . Alcohol abuse   . Auditory hallucinations   . Alcohol abuse with alcohol-induced mood disorder (HCC) 09/08/2016  . Adjustment disorder with depressed mood 09/07/2016  . Homelessness 09/03/2016  . Person feigning illness 09/03/2016  . 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 Cannabis use disorder, mild, abuse 08/29/2016  . Paranoid schizophrenia (HCC) 08/26/2016  . Pulmonary emboli (HCC) 07/13/2016  . 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 Acute left flank pain 01/02/2014  . Pulmonary embolism, bilateral (HCC) 01/02/2014    History reviewed. No pertinent surgical history.     Home Medications    Prior to Admission medications   Medication Sig Start Date End  Date Taking? Authorizing Provider  benztropine (COGENTIN) 1 MG tablet Take 1 tablet (1 mg total) by mouth at bedtime. 07/11/17   Raeford Razor, MD  FLUoxetine (PROZAC) 20 MG tablet Take 1 tablet (20 mg total) by mouth daily. 06/10/17   Charm Rings, NP  nicotine (NICODERM CQ - DOSED IN MG/24 HOURS) 21 mg/24hr patch Place 1 patch (21 mg total) onto the skin daily. Patient not taking: Reported on 06/10/2017 04/06/17   Adonis Brook, NP  risperiDONE (RISPERDAL) 3 MG tablet Take 1 tablet (3 mg total) by mouth at bedtime. 07/11/17   Raeford Razor, MD    Family History Family History  Problem Relation Age of Onset  . Hypertension Mother     Social History Social History  Substance Use Topics  . Smoking status: Current Every Day Smoker    Packs/day: 0.50    Types: Cigarettes  . Smokeless tobacco: Never Used  . Alcohol use Yes     Comment: socially     Allergies   Haldol [haloperidol]   Review of Systems Review of Systems  Constitutional: Negative for fever.  Cardiovascular: Negative for chest pain.  Gastrointestinal: Negative for abdominal pain.  Neurological: Negative for headaches.  Psychiatric/Behavioral: Positive for hallucinations.  All other systems reviewed and are negative.    Physical Exam Updated Vital Signs BP 120/73 (BP Location: Right Arm)   Pulse 92   Temp 97.9 F (36.6 C) (Oral)   Resp 16   Ht 1.778 m ( )   Wt 99.8 kg (220 lb)   SpO2 97%  BMI 31.57 kg/m   Physical Exam  CONSTITUTIONAL: Disheveled, no acute distress, poor hygiene HEAD: Normocephalic/atraumatic EYES: EOMI ENMT: Mucous membranes moist NECK: supple no meningeal signs CV: S1/S2 noted, no murmurs/rubs/gallops noted LUNGS: Lungs are clear to auscultation bilaterally ABDOMEN: soft, nontender  NEURO: Pt is awake/alert/appropriate, moves all extremitiesx4.  No facial droop.   EXTREMITIES: pulses normal/equal, full ROM SKIN: warm, color normal PSYCH: no abnormalities of mood  noted, alert and oriented to situation  ED Treatments / Results  Labs (all labs ordered are listed, but only abnormal results are displayed) Labs Reviewed  COMPREHENSIVE METABOLIC PANEL - Abnormal; Notable for the following:       Result Value   Glucose, Bld 103 (*)    Calcium 8.5 (*)    All other components within normal limits  ETHANOL - Abnormal; Notable for the following:    Alcohol, Ethyl (B) 68 (*)    All other components within normal limits  CBC - Abnormal; Notable for the following:    WBC 3.7 (*)    Hemoglobin 12.3 (*)    HCT 37.3 (*)    All other components within normal limits  RAPID URINE DRUG SCREEN, HOSP PERFORMED    EKG  EKG Interpretation None       Radiology No results found.  Procedures Procedures (including critical care time)  Medications Ordered in ED Medications  benztropine (COGENTIN) tablet 1 mg (not administered)  FLUoxetine (PROZAC) tablet 20 mg (not administered)  risperiDONE (RISPERDAL) tablet 3 mg (not administered)     Initial Impression / Assessment and Plan / ED Course  I have reviewed the triage vital signs and the nursing notes.  Pertinent labs   results that were available during my care of the patient were reviewed by me and considered in my medical decision making (see chart for details).     3:59 AM Pt medically stable Home meds ordered Awaiting psych evaluation   Final Clinical Impressions(s) / ED Diagnoses   Final diagnoses:  Hallucinations    New Prescriptions New Prescriptions   No medications on file     Zadie Rhine, MD 08/11/17 0400

## 2017-08-11 NOTE — ED Notes (Signed)
Patient's clothes and personal belongings inventoried and placed in a 2 plastic bags /labelled  And stored at locker#2 at [employer].

## 2017-08-11 NOTE — ED Notes (Signed)
TTS counselor advised RN that hpt. will be discharged and she will fax resources documents for pt. , she also notified EDP on plan of care .

## 2017-08-11 NOTE — ED Notes (Signed)
Pt given soda and turkey sandwich  

## 2017-08-11 NOTE — ED Notes (Signed)
TTS conference in progress .

## 2017-08-11 NOTE — ED Provider Notes (Signed)
Pt seen by psych Deemed appropriate for discharge He denies SI He denies command hallucinations, the voices are NOT telling him to harm himself He denies previous h/o Suicide attempt He denies access to gun  Will discharge with resources and f/u with [facility] outpatient    Zadie Rhine, MD 08/11/17 [phone]

## 2017-08-11 NOTE — BH Assessment (Signed)
Per [name], NP: Patient does not meet criteria for inpatient treatment.  NP recommends Patient be given referrals for outpatient treatment and follow up with [facility]. Patient given suicide prevention information, 24 hour crisis numbers, and referrals to outpatient Medicaid providers, per fax to RN.    Attending Provider, Bebe Shaggy, MD notified of disposition at [phone].  Reita Cliche, RN notified of [phone].

## 2017-08-11 NOTE — ED Notes (Signed)
Unable to give urine specimen at this time .  

## 2017-09-22 ENCOUNTER — Inpatient Hospital Stay (HOSPITAL_COMMUNITY)
Admission: AD | Admit: 2017-09-22 | Discharge: 2017-09-26 | DRG: 881 | Disposition: A | Discharge status: Home / Self Care | Payer: Federal, State, Local not specified - Other | Source: Intra-hospital | Attending: Psychiatry | Admitting: Psychiatry

## 2017-09-22 ENCOUNTER — Encounter (HOSPITAL_COMMUNITY): Payer: Self-pay

## 2017-09-22 ENCOUNTER — Encounter (HOSPITAL_COMMUNITY): Payer: Self-pay | Admitting: Emergency Medicine

## 2017-09-22 ENCOUNTER — Emergency Department (HOSPITAL_COMMUNITY)
Admission: EM | Admit: 2017-09-22 | Discharge: 2017-09-22 | Disposition: A | Discharge status: Other Acute Inpatient Hospital | Payer: Self-pay | Attending: Emergency Medicine | Admitting: Emergency Medicine

## 2017-09-22 DIAGNOSIS — E7800 Pure hypercholesterolemia, unspecified: Secondary | ICD-10-CM | POA: Diagnosis present

## 2017-09-22 DIAGNOSIS — F1014 Alcohol abuse with alcohol-induced mood disorder: Secondary | ICD-10-CM | POA: Diagnosis present

## 2017-09-22 DIAGNOSIS — F200 Paranoid schizophrenia: Secondary | ICD-10-CM | POA: Diagnosis present

## 2017-09-22 DIAGNOSIS — R443 Hallucinations, unspecified: Secondary | ICD-10-CM | POA: Insufficient documentation

## 2017-09-22 DIAGNOSIS — Y906 Blood alcohol level of 120-199 mg/100 ml: Secondary | ICD-10-CM | POA: Diagnosis not present

## 2017-09-22 DIAGNOSIS — R440 Auditory hallucinations: Secondary | ICD-10-CM | POA: Diagnosis present

## 2017-09-22 DIAGNOSIS — F10151 Alcohol abuse with alcohol-induced psychotic disorder with hallucinations: Secondary | ICD-10-CM | POA: Diagnosis present

## 2017-09-22 DIAGNOSIS — F329 Major depressive disorder, single episode, unspecified: Principal | ICD-10-CM | POA: Diagnosis present

## 2017-09-22 DIAGNOSIS — F1010 Alcohol abuse, uncomplicated: Secondary | ICD-10-CM | POA: Diagnosis not present

## 2017-09-22 DIAGNOSIS — Z9114 Patient's other noncompliance with medication regimen: Secondary | ICD-10-CM

## 2017-09-22 DIAGNOSIS — Z590 Homelessness: Secondary | ICD-10-CM | POA: Insufficient documentation

## 2017-09-22 DIAGNOSIS — Z86711 Personal history of pulmonary embolism: Secondary | ICD-10-CM

## 2017-09-22 DIAGNOSIS — Z888 Allergy status to other drugs, medicaments and biological substances status: Secondary | ICD-10-CM | POA: Diagnosis not present

## 2017-09-22 DIAGNOSIS — F17210 Nicotine dependence, cigarettes, uncomplicated: Secondary | ICD-10-CM | POA: Insufficient documentation

## 2017-09-22 DIAGNOSIS — F29 Unspecified psychosis not due to a substance or known physiological condition: Secondary | ICD-10-CM | POA: Insufficient documentation

## 2017-09-22 DIAGNOSIS — Z79899 Other long term (current) drug therapy: Secondary | ICD-10-CM | POA: Insufficient documentation

## 2017-09-22 LAB — COMPREHENSIVE METABOLIC PANEL
ALK PHOS: 51 U/L (ref 38–126)
ALT: 47 U/L (ref 17–63)
ANION GAP: 11 (ref 5–15)
AST: 33 U/L (ref 15–41)
Albumin: 4.2 g/dL (ref 3.5–5.0)
BILIRUBIN TOTAL: 0.3 mg/dL (ref 0.3–1.2)
BUN: 8 mg/dL (ref 6–20)
CALCIUM: 8.8 mg/dL — AB (ref 8.9–10.3)
CO2: 22 mmol/L (ref 22–32)
Chloride: 105 mmol/L (ref 101–111)
Creatinine, Ser: 0.9 mg/dL (ref 0.61–1.24)
GFR calc Af Amer: >60 mL/min (ref >60)
Glucose, Bld: 87 mg/dL (ref 65–99)
POTASSIUM: 3.3 mmol/L — AB (ref 3.5–5.1)
Sodium: 138 mmol/L (ref 135–145)
TOTAL PROTEIN: 7.6 g/dL (ref 6.5–8.1)

## 2017-09-22 LAB — CBC
HCT: 40.9 % (ref 39.0–52.0)
HEMOGLOBIN: 13.7 g/dL (ref 13.0–17.0)
MCH: 28.2 pg (ref 26.0–34.0)
MCHC: 33.5 g/dL (ref 30.0–36.0)
MCV: 84.3 fL (ref 78.0–100.0)
PLATELETS: 301 10*3/uL (ref 150–400)
RBC: 4.85 MIL/uL (ref 4.22–5.81)
RDW: 12.9 % (ref 11.5–15.5)
WBC: 6.3 10*3/uL (ref 4.0–10.5)

## 2017-09-22 LAB — RAPID URINE DRUG SCREEN, HOSP PERFORMED
Amphetamines: NOT DETECTED
BARBITURATES: NOT DETECTED
Benzodiazepines: NOT DETECTED
COCAINE: NOT DETECTED
OPIATES: NOT DETECTED
Tetrahydrocannabinol: NOT DETECTED

## 2017-09-22 LAB — ETHANOL: ALCOHOL ETHYL (B): 143 mg/dL — AB (ref <10)

## 2017-09-22 MED ORDER — MAGNESIUM HYDROXIDE 400 MG/5ML PO SUSP: 30.0000 mL | Freq: Every day | ORAL | Status: DC | PRN

## 2017-09-22 MED ORDER — FLUOXETINE HCL 20 MG PO TABS
20.0000 mg | ORAL_TABLET | Freq: Every day | ORAL | Status: DC
  Administered 2017-09-22 – 2017-09-26 (×5): 20 mg via ORAL
  Filled 2017-09-22 (×8): qty 1

## 2017-09-22 MED ORDER — ALUM & MAG HYDROXIDE-SIMETH 200-200-20 MG/5ML PO SUSP: 30.0000 mL | ORAL | Status: DC | PRN

## 2017-09-22 MED ORDER — ACETAMINOPHEN 325 MG PO TABS: 650.0000 mg | ORAL_TABLET | Freq: Four times a day (QID) | ORAL | Status: DC | PRN

## 2017-09-22 MED ORDER — BENZTROPINE MESYLATE 1 MG PO TABS
1.0000 mg | ORAL_TABLET | Freq: Every day | ORAL | Status: DC
  Administered 2017-09-22: 1 mg via ORAL
  Filled 2017-09-22 (×3): qty 1

## 2017-09-22 MED ORDER — RISPERIDONE 3 MG PO TABS
3.0000 mg | ORAL_TABLET | Freq: Every day | ORAL | Status: DC
  Administered 2017-09-22: 3 mg via ORAL
  Filled 2017-09-22 (×3): qty 1

## 2017-09-22 NOTE — [facility] Group Notes (Signed)
LCSW Group Therapy Note  09/22/2017 1:15pm  Type of Therapy and Topic:  Group Therapy:  Feelings around Relapse and Recovery  Participation Level:  Active while in group  Description of Group:    Patients in this group will discuss emotions they experience before and after a relapse. They will process how experiencing these feelings, or avoidance of experiencing them, relates to having a relapse. Facilitator will guide patients to explore emotions they have related to recovery. Patients will be encouraged to process which emotions are more powerful. They will be guided to discuss the emotional reaction significant others in their lives may have to their relapse or recovery. Patients will be assisted in exploring ways to respond to the emotions of others without this contributing to a relapse.  Therapeutic Goals: 1. Patient will identify two or more emotions that lead to a relapse for them 2. Patient will identify two emotions that result when they relapse 3. Patient will identify two emotions related to recovery 4. Patient will demonstrate ability to communicate their needs through discussion and/or role plays   Summary of Patient Progress:  Arrived late after meeting w CSW and MD.  Active while present, contributed encouragement to peers re need to actively engage in recovery.  "Recovery is for people who do it, not just attend meetings."  Stated intention to "follow directions" re recovery process, states he was reluctant to follow direction of sponsor and others more experienced in recovery "but now I am willing to do whatever it takes, go wherever I need to in order to get into recovery."  Reports significant struggle w gaining and maintaining sobriety long term.     Therapeutic Modalities:   Cognitive Behavioral Therapy Solution-Focused Therapy Assertiveness Training Relapse Prevention Therapy   Sallee Langenne C Pierrette Scheu, LCSW 09/22/2017 2:53 PM

## 2017-09-22 NOTE — Consult Note (Signed)
Telepsych Consultation   Reason for Consult:  Auditory hallucinations Referring Physician:  Delaine Lame Location of Patient: [place] Location of Provider: [facility]  Patient Identification: Cole Barrett MRN:  [id number] Principal Diagnosis: Auditory hallucinations Diagnosis:   Patient Active Problem List   Diagnosis Date Noted  . Alcohol abuse [F10.10]   . Auditory hallucinations [R44.0]   . Alcohol abuse with alcohol-induced mood disorder ([place]) [F10.14] 09/08/2016  . Adjustment disorder with depressed mood [F43.21] 09/07/2016  . Homelessness [Z59.0] 09/03/2016  . Person feigning illness [Z76.5] 09/03/2016  . 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 Cannabis use disorder, mild, abuse [F12.10] 08/29/2016  . Paranoid schizophrenia ([place]) [F20.0] 08/26/2016  . Pulmonary emboli ([place]) [I26.99] 07/13/2016  . Pulmonary embolism and infarction ([place]) [I26.99] 01/02/2014  . Acute left flank pain [R10.9] 01/02/2014  . Pulmonary embolism, bilateral ([place]) [I26.99] 01/02/2014    Total Time spent with patient: 45 minutes  Subjective:   Cole Barrett is a 31 y.o. male patient presented to [facility] intoxicated with complaints or auditory hallucinations with voices telling him that they are going to kill him  HPI:  Cole Barrett, 31 y.o., male patient seen via telepsych by this provider on 09/22/17.  Chart reviewed and consulted with Dr. Dwyane Dee.  Patient has had multiple visits to ED with similar complaints of auditory hallucinations and intoxication (greater than 17 this year).  On evaluation Cole Barrett reports that he is hearing voices that are telling him that they are going to kill him.  States that they are really bad.  Patient states that he is unsure of the last time he has taken his medication; "but I don't have any"; he is also unsure of the last time he was seen at [facility]  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  "I just need to get myself together and get these voices under control.  I've had them for a long time but getting worse."  Patient denies suicidal/homicidal ideation.  He endorses some paranoia related to the voices getting worse.  He also denies visual hallucinations.  Patient states that he does drink alcohol and last time drank was yesterday but denies that he drinks everyday.  Denies illicit drug use and UDS negative.   During assessment patient was calm/cooperative, alert/oriented x 4, but some forgetfulness related to the medications he had been taking and last time he took medication.  Did say that he had been to [facility] before for outpatient services.  Patient was also unaware of the multiple visits he had in the ED with similar complaints.    Past Psychiatric History: Auditory hallucinations, Paranoid Schizophrenia, Alcohol abuse.   Risk to Self: Suicidal Ideation: No (DENIES) Suicidal Intent: No Is patient at risk for suicide?: No Suicidal Plan?: No Access to Means: No (DENIES ACCESS TO GUNS, WEAPONS) What has been your use of drugs/alcohol within the last 12 months?: REGULAR USE How many times?: 0 Other Self Harm Risks: NONE REPORTED Triggers for Past Attempts: None known Intentional Self Injurious Behavior: None Risk to Others: Homicidal Ideation: No (DENIES) Thoughts of Harm to Others: No Current Homicidal Intent: No Current Homicidal Plan: No Access to Homicidal Means: No Identified Victim: NONE History of harm to others?: No (NONE REPORTED) Assessment of Violence: None Noted Does patient have access to weapons?: No Criminal Charges Pending?: No (DENIES ANY LEGAL HX) Does patient  have a court date: No Prior Inpatient Therapy: Prior Inpatient Therapy: Yes Prior Therapy Dates: PER PT RECORD, 2017  Prior Therapy Facilty/Provider(s): [facility] Reason for Treatment:  ALCOHOL-INDUCED MOOD D/O WITH PSYCHOTIC FEATURES/SCHIZOPHRENIA Prior Outpatient Therapy: Prior Outpatient Therapy: No Does patient have an ACCT team?: No Does patient have Intensive In-House Services?  : No Does patient have [facility] services? : No (FAILED TO FOLLOW-UP AFTER REFERRAL) Does patient have P4CC services?: No  Past Medical History:  Past Medical History:  Diagnosis Date  . Homelessness   . Hypercholesterolemia   . Mental disorder   . Paranoid behavior ([place])   . PE (pulmonary embolism)   . Schizophrenia ([place])    History reviewed. No pertinent surgical history. Family History:  Family History  Problem Relation Age of Onset  . Hypertension Mother    Family Psychiatric  History: Unaware Social History:  History  Alcohol Use  . Yes    Comment: socially     History  Drug Use  . Types: Marijuana    Comment: Once every 2-3 months, Last used: Last month    Social History   Social History  . Marital status: Single    Spouse name: N/A  . Number of children: N/A  . Years of education: N/A   Social History Main Topics  . Smoking status: Current Every Day Smoker    Packs/day: 0.50    Types: Cigarettes  . Smokeless tobacco: Never Used  . Alcohol use Yes     Comment: socially  . Drug use: Yes    Types: Marijuana     Comment: Once every 2-3 months, Last used: Last month  . Sexual activity: No   Other Topics Concern  . None   Social History Narrative  . None   Additional Social History:    Allergies:   Allergies  Allergen Reactions  . Haldol [Haloperidol] Shortness Of Breath    Labs:  Results for orders placed or performed during the hospital encounter of 09/22/17 (from the past 48 hour(s))  Comprehensive metabolic panel     Status: Abnormal   Collection Time: 09/22/17  2:49 AM  Result Value Ref Range   Sodium 138 135 - 145 mmol/L   Potassium 3.3 (L) 3.5 - 5.1 mmol/L   Chloride 105 101 - 111 mmol/L   CO2 22 22 - 32 mmol/L   Glucose, Bld 87 65 -  99 mg/dL   BUN 8 6 - 20 mg/dL   Creatinine, Ser 0.90 0.61 - 1.24 mg/dL   Calcium 8.8 (L) 8.9 - 10.3 mg/dL   Total Protein 7.6 6.5 - 8.1 g/dL   Albumin 4.2 3.5 - 5.0 g/dL   AST 33 15 - 41 U/L   ALT 47 17 - 63 U/L   Alkaline Phosphatase 51 38 - 126 U/L   Total Bilirubin 0.3 0.3 - 1.2 mg/dL   GFR calc non Af Amer >60 >60 mL/min   GFR calc Af Amer >60 >60 mL/min    Comment: (NOTE) The eGFR has been calculated using the CKD EPI equation. This calculation has not been validated in all clinical situations. eGFR's persistently <60 mL/min signify possible Chronic Kidney Disease.    Anion gap 11 5 - 15  Ethanol     Status: Abnormal   Collection Time: 09/22/17  2:49 AM  Result Value Ref Range   Alcohol, Ethyl (B) 143 (H) <10 mg/dL    Comment:        LOWEST DETECTABLE LIMIT FOR  SERUM ALCOHOL IS 10 mg/dL FOR MEDICAL PURPOSES ONLY   cbc     Status: None   Collection Time: 09/22/17  2:49 AM  Result Value Ref Range   WBC 6.3 4.0 - 10.5 K/uL   RBC 4.85 4.22 - 5.81 MIL/uL   Hemoglobin 13.7 13.0 - 17.0 g/dL   HCT 40.9 39.0 - 52.0 %   MCV 84.3 78.0 - 100.0 fL   MCH 28.2 26.0 - 34.0 pg   MCHC 33.5 30.0 - 36.0 g/dL   RDW 12.9 11.5 - 15.5 %   Platelets 301 150 - 400 K/uL  Rapid urine drug screen (hospital performed)     Status: None   Collection Time: 09/22/17  2:52 AM  Result Value Ref Range   Opiates NONE DETECTED NONE DETECTED   Cocaine NONE DETECTED NONE DETECTED   Benzodiazepines NONE DETECTED NONE DETECTED   Amphetamines NONE DETECTED NONE DETECTED   Tetrahydrocannabinol NONE DETECTED NONE DETECTED   Barbiturates NONE DETECTED NONE DETECTED    Comment:        DRUG SCREEN FOR MEDICAL PURPOSES ONLY.  IF CONFIRMATION IS NEEDED FOR ANY PURPOSE, NOTIFY LAB WITHIN 5 DAYS.        LOWEST DETECTABLE LIMITS FOR URINE DRUG SCREEN Drug Class       Cutoff (ng/mL) Amphetamine      1000 Barbiturate      200 Benzodiazepine   846 Tricyclics       659 Opiates          300 Cocaine           300 THC              50     Medications:  No current facility-administered medications for this encounter.    Current Outpatient Prescriptions  Medication Sig Dispense Refill  . benztropine (COGENTIN) 1 MG tablet Take 1 tablet (1 mg total) by mouth at bedtime. 10 tablet 0  . FLUoxetine (PROZAC) 20 MG tablet Take 1 tablet (20 mg total) by mouth daily. 30 tablet 0  . nicotine (NICODERM CQ - DOSED IN MG/24 HOURS) 21 mg/24hr patch Place 1 patch (21 mg total) onto the skin daily. (Patient not taking: Reported on 06/10/2017) 28 patch 0  . risperiDONE (RISPERDAL) 3 MG tablet Take 1 tablet (3 mg total) by mouth at bedtime. 10 tablet 0    Musculoskeletal: Strength & Muscle Tone: within normal limits Gait & Station: normal Patient leans: N/A  Psychiatric Specialty Exam: Physical Exam  ROS  Blood pressure 112/72, pulse 66, temperature 98.2 F (36.8 C), temperature source Oral, resp. rate 18, SpO2 99 %.There is no height or weight on file to calculate BMI.  General Appearance: Casual and Disheveled  Eye Contact:  Good  Speech:  Clear and Coherent and Normal Rate  Volume:  Normal  Mood:  Anxious and Depressed  Affect:  Congruent  Thought Process:  Disorganized  Orientation:  Full (Time, Place, and Person)  Thought Content:  Hallucinations: Auditory Voices that are saying they are going to kill him  Suicidal Thoughts:  No  Homicidal Thoughts:  No  Memory:  Immediate;   Fair Recent;   Fair Remote;   Poor  Judgement:  Impaired  Insight:  Fair  Psychomotor Activity:  Normal  Concentration:  Concentration: Fair and Attention Span: Fair  Recall:  Poor  Fund of Knowledge:  Fair  Language:  Good  Akathisia:  No  Handed:  Right  AIMS (if indicated):  Assets:  Communication Skills Desire for Improvement Housing  ADL's:  Intact  Cognition:  WNL  Sleep:        Treatment Plan Summary: Daily contact with patient to assess and evaluate symptoms and progress in treatment, Medication  management and Plan Inpatient psychiatric treatment  Disposition: Recommend psychiatric Inpatient admission when medically cleared.  Patient has been accepted to [facility]  This service was provided via telemedicine using a 2-way, interactive audio and video technology.  Names of all persons participating in this telemedicine service and their role in this encounter. Name: Earleen Newport NP Role: Telepsych  Name: Dr Dwyane Dee Role: Psychiatrist  Name: Scot Jun Role: Patient  Name:  Role:     Earleen Newport, NP 09/22/2017 9:45 AM

## 2017-09-22 NOTE — ED Notes (Signed)
TTS at bedside. 

## 2017-09-22 NOTE — ED Notes (Signed)
Wand by security.   

## 2017-09-22 NOTE — BH Assessment (Addendum)
Tele Assessment Note   Patient Name: Jacquelin HawkingChristopher Adam Lora MRN: [id number] Referring Physician: Ivar DrapeOB BROWNING, PA/DR DELO Location of Patient: [facility] Location of Provider: Behavioral Health TTS Department  Jacquelin HawkingChristopher Adam Tencza is an 31 y.o. male came voluntarily [facility] tonight unaccompanied. Pt was not able to answer many questions or elaborate. Pt was intoxicated with a  BAL of 143. Pt's UDS was negative for all substances tonight. Pt denied SI, HI but sts he is hearing voices who tell him they are going to kill him. Pt denies any attempts of suicide. Pt has previously been diagnosed with Alcohol-Induced Mood D/O with psychotic features and Schizophrenia, Paranoid type. Pt did not appear to have disorganized thoughts, speech or behavior. Pt reports "nothing in particular" in the way of stressors. Pt sts he is not prescribed any psychiatric medications and does not see a pyschiatrist or a therapist despite being [facility] to [facility] after several ED visits. Pt has been seen in the ED 14 times in 2018  for mental health symptoms and 6 times more for jaw pain and a sprained ankle. [facility]. Pt sts he has also been [facility] at [facility]. Per pt's record, hx indicates that in 2017 pt was suspected of feiigning illness. Pt has been homeless and when asked about living arrangements now, gives an address but no further details.   Per pt record, he completed school through the 12th grade, is not employed and sts he receives Disability income. Pt denies any legal issues, past or present. Pt denies any hx of abuse. Pt denies any desire to hurt others and denies any hx of verbal or physical aggression. Pt denies access to guns or weapons. Pt sts his family is not supportive. Pt sts he has been sad, lacked motiviattion most of the time for activities and isolated himself often per pt record. Pt was not able to confirm continued  symptoms of depression tonight. Pt denies any use of alcohol beyond 1 beer this morning. Pt sts he no longer uses cannabis. Pt sts he smokes about 1/2 pack of cigarettes when he can.  Pt was dressed in scrubs and sitting propped up on one arm on his hospital bed. Pt had to be woken up for this assessment but appeared still  intoxicated based on greatly slurring his words and continually falling asleep. Pt was cooperative and polite. Pt kept poor eye contact, spoke in a muffled hoarse tone and at a slow pace. Pt moved in a slow manner when moving. Pt's thought process was coherent and relevant and judgement was impaired.  No indication of delusional thinking or response to internal stimuli. Pt's mood was stated as depressed but not anxious and his flat affect was congruent.  Pt was oriented x , to person, place and situation.   Diagnosis: Alcohol-Induced Mood D/O with psychotic features by hx; Alcohol Use D/O, Severe; Schizophrenia by hx  Past Medical History:  Past Medical History:  Diagnosis Date  . Homelessness   . Hypercholesterolemia   . Mental disorder   . Paranoid behavior (HCC)   . PE (pulmonary embolism)   . Schizophrenia (HCC)     History reviewed. No pertinent surgical history.  Family History:  Family History  Problem Relation Age of Onset  . Hypertension Mother     Social History:  reports that he has been smoking Cigarettes.  He has been smoking about 0.50 packs per day. He has never  used smokeless tobacco. He reports that he drinks alcohol. He reports that he uses drugs, including Marijuana.  Additional Social History:  Alcohol / Drug Use Prescriptions: STS NO RX MEDS CURRENTLY History of alcohol / drug use?: Yes Longest period of sobriety (when/how long): UNKNOWN Substance #1 Name of Substance 1: ALCOHOL 1 - Age of First Use: UNKNOWN 1 - Amount (size/oz): NO DETAIL GIVEN 1 - Frequency: STS 1 X WEEK 1 - Duration: ONGOING 1 - Last Use / Amount: 09/22/17 Substance  #2 Name of Substance 2: CANNABIS 2 - Age of First Use: UNKNONW 2 - Amount (size/oz): UNKNOWN 2 - Frequency: STS "EVERY NOW AND THEN" 2 - Duration: UNKNOWN 2 - Last Use / Amount: STS DOES NOT KNOW Substance #3 Name of Substance 3: NICOTINE/CIGARETTES 3 - Age of First Use: UNKNOWN 3 - Amount (size/oz): 1/2 PACK 3 - Frequency: DAILY (IF POSSIBLE) 3 - Duration: UNKNOWN 3 - Last Use / Amount: 09/22/17  CIWA: CIWA-Ar BP: 119/89 Pulse Rate: (!) 106 COWS:    PATIENT STRENGTHS: (choose at least two) Average or above average intelligence Communication skills  Allergies:  Allergies  Allergen Reactions  . Haldol [Haloperidol] Shortness Of Breath    Home Medications:  (Not in a hospital admission)  OB/GYN Status:  No LMP for male patient.  General Assessment Data Location of Assessment: [facility] ED TTS Assessment: In system Is this a Tele or Face-to-Face Assessment?: Tele Assessment Is this an Initial Assessment or a Re-assessment for this encounter?: Initial Assessment Marital status: Single Is patient pregnant?: No Pregnancy Status: No Living Arrangements: Alone (STATES AN ADDRESS WHERE HE STS HE LIVES ALONE) Can pt return to current living arrangement?: Yes Admission Status: Voluntary Is patient capable of signing voluntary admission?: Yes Referral Source: Self/Family/Friend Insurance type: SELF PAY     Crisis Care Plan Living Arrangements: Alone (STATES AN ADDRESS WHERE HE STS HE LIVES ALONE) Name of Psychiatrist: NONE Name of Therapist: NONE  Education Status Is patient currently in school?: No Highest grade of school patient has completed: 12  Risk to self with the past 6 months Suicidal Ideation: No (DENIES) Has patient been a risk to self within the past 6 months prior to admission? : No Suicidal Intent: No Has patient had any suicidal intent within the past 6 months prior to admission? : No Is patient at risk for suicide?: No Suicidal Plan?: No Has patient  had any suicidal plan within the past 6 months prior to admission? : No Access to Means: No (DENIES ACCESS TO GUNS, WEAPONS) What has been your use of drugs/alcohol within the last 12 months?: REGULAR USE Previous Attempts/Gestures: No (DENIES) How many times?: 0 Other Self Harm Risks: NONE REPORTED Triggers for Past Attempts: None known Intentional Self Injurious Behavior: None Family Suicide History: No Recent stressful life event(s):  (NONE REPORTED) Persecutory voices/beliefs?: No Depression: Yes Depression Symptoms: Despondent, Isolating, Fatigue, Guilt, Feeling worthless/self pity Substance abuse history and/or treatment for substance abuse?: Yes Suicide prevention information given to non-admitted patients: Not applicable  Risk to Others within the past 6 months Homicidal Ideation: No (DENIES) Does patient have any lifetime risk of violence toward others beyond the six months prior to admission? : No (NONE REPORTED) Thoughts of Harm to Others: No Current Homicidal Intent: No Current Homicidal Plan: No Access to Homicidal Means: No Identified Victim: NONE History of harm to others?: No (NONE REPORTED) Assessment of Violence: None Noted Does patient have access to weapons?: No Criminal Charges Pending?: No (DENIES ANY  LEGAL HX) Does patient have a court date: No Is patient on probation?: No  Psychosis Hallucinations: Auditory (STS HEARS VOICES STATING THEY WILL KILL HIM) Delusions: None noted  Mental Status Report Appearance/Hygiene: Disheveled, In scrubs Eye Contact: Poor Motor Activity: Freedom of movement, Psychomotor retardation, Unsteady Speech: Slurred, Logical/coherent Level of Consciousness: Drowsy (HAD TO BE WOKEN UP FOR ASSESSMENT) Mood: Depressed Affect: Flat Anxiety Level: None Thought Processes: Coherent, Relevant Judgement: Impaired Orientation: Person, Place, Situation Obsessive Compulsive Thoughts/Behaviors: None  Cognitive  Functioning Concentration: Poor Memory: Unable to Assess IQ: Average Insight: Poor Impulse Control: Poor Appetite: Good Weight Loss: 0 Weight Gain: 0 Sleep: No Change Total Hours of Sleep:  ("I SLEEP GOOD.") Vegetative Symptoms: Unable to Assess  ADLScreening [facility]) Patient's cognitive ability adequate to safely complete daily activities?: Yes Patient able to express need for assistance with ADLs?: Yes Independently performs ADLs?: Yes (appropriate for developmental age)  Prior Inpatient Therapy Prior Inpatient Therapy: Yes Prior Therapy Dates: PER PT RECORD, 2017  Prior Therapy Facilty/Provider(s): [facility] Reason for Treatment: ALCOHOL-INDUCED MOOD D/O WITH PSYCHOTIC FEATURES/SCHIZOPHRENIA  Prior Outpatient Therapy Prior Outpatient Therapy: No Does patient have an ACCT team?: No Does patient have Intensive In-House Services?  : No Does patient have [facility] services? : No (FAILED TO FOLLOW-UP AFTER REFERRAL) Does patient have P4CC services?: No  ADL Screening (condition at time of admission) Patient's cognitive ability adequate to safely complete daily activities?: Yes Patient able to express need for assistance with ADLs?: Yes Independently performs ADLs?: Yes (appropriate for developmental age)       Abuse/Neglect Assessment (Assessment to be complete while patient is alone) Physical Abuse: Denies Verbal Abuse: Denies Sexual Abuse: Denies Exploitation of patient/patient's resources: Denies Self-Neglect: Denies     Merchant navy officer (For Healthcare) Does Patient Have a Medical Advance Directive?: No Would patient like information on creating a medical advance directive?: No - Patient declined    Additional Information 1:1 In Past 12 Months?: No CIRT Risk: No Elopement Risk: No Does patient have medical clearance?: Yes     Disposition:  Disposition Initial Assessment Completed for this Encounter: Yes Disposition of  Patient: Other dispositions Other disposition(s): Other (Comment) (PENDING REVIEW W [facility] EXTENDER)  This service was provided via telemedicine using a 2-way, interactive audio and video technology.  Names of all persons participating in this telemedicine service and their role in this encounter. Name: Beryle Flock Role: Triage Specialist, [facility], CRC  Name:Tushar Laural Benes Role: Patient  Name:  Role:   Name:  Role:    Per Hillery Jacks, NP recommend overnight observation for safety & stability for probable discharge tomorrow.   Spoke with Ivar Drape, PA and advised of recommendation. He agreed.   Beryle Flock, MS, CRC, [facility] Psychiatric Triage Specialist [facility] T 09/22/2017 4:28 AM

## 2017-09-22 NOTE — Progress Notes (Signed)
D.  Pt  Is new admission, came in today.  Pt has remained in room this shift, appears very guarded on approach.  Pt reports auditory hallucinations.  Pt denies SI/HI at this time.  A.  Support and encouragement offered, medication given as ordered  R.  Pt remains safe on the unit, will continue to monitor.

## 2017-09-22 NOTE — ED Provider Notes (Signed)
[name] EMERGENCY DEPARTMENT Provider Note   CSN: [id number] Arrival date & time: 09/22/17  0240     History   Chief Complaint Chief Complaint  Patient presents with  . Hallucinations    HPI Cole Barrett is a 31 y.o. male.  Patient with past medical history remarkable for schizophrenia, mental disorder, paranoid behavior presents to the emergency department with chief complaint of auditory hallucinations.  He states that he has been hearing voices, but states that they want to kill him.  He denies any suicidal or homicidal ideation.  Denies taking any drugs or alcohol.  Denies taking medications for symptoms.  He denies any other associated symptoms or modifying factors.   The history is provided by the patient. No language interpreter was used.    Past Medical History:  Diagnosis Date  . Homelessness   . Hypercholesterolemia   . Mental disorder   . Paranoid behavior (HCC)   . PE (pulmonary embolism)   . Schizophrenia [facility](HCC)     Patient Active Problem List   Diagnosis Date Noted  . Alcohol abuse   . Auditory hallucinations   . Alcohol abuse with alcohol-induced mood disorder (HCC) 09/08/2016  . Adjustment disorder with depressed mood 09/07/2016  . Homelessness 09/03/2016  . Person feigning illness 09/03/2016  . 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 Cannabis use disorder, mild, abuse 08/29/2016  . Paranoid schizophrenia (HCC) 08/26/2016  . Pulmonary emboli (HCC) 07/13/2016  . 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 Acute left flank pain 01/02/2014  . Pulmonary embolism, bilateral (HCC) 01/02/2014    History reviewed. No pertinent surgical history.     Home Medications    Prior to Admission medications   Medication Sig Start Date End Date Taking? Authorizing Provider  benztropine (COGENTIN) 1 MG tablet Take 1 tablet (1 mg total) by mouth at bedtime. 07/11/17   Raeford RazorKohut, Stephen, MD  FLUoxetine (PROZAC) 20  MG tablet Take 1 tablet (20 mg total) by mouth daily. 06/10/17   Charm RingsLord, Jamison Y, NP  nicotine (NICODERM CQ - DOSED IN MG/24 HOURS) 21 mg/24hr patch Place 1 patch (21 mg total) onto the skin daily. Patient not taking: Reported on 06/10/2017 04/06/17   Adonis BrookAgustin, Sheila, NP  risperiDONE (RISPERDAL) 3 MG tablet Take 1 tablet (3 mg total) by mouth at bedtime. 07/11/17   Raeford RazorKohut, Stephen, MD    Family History Family History  Problem Relation Age of Onset  . Hypertension Mother     Social History Social History  Substance Use Topics  . Smoking status: Current Every Day Smoker    Packs/day: 0.50    Types: Cigarettes  . Smokeless tobacco: Never Used  . Alcohol use Yes     Comment: socially     Allergies   Haldol [haloperidol]   Review of Systems Review of Systems  All other systems reviewed and are negative.    Physical Exam Updated Vital Signs BP 119/89 (BP Location: Left Arm)   Pulse (!) 106   Temp 98.2 F (36.8 C) (Oral)   Resp 18   SpO2 98%   Physical Exam  Constitutional: He is oriented to person, place, and time. He appears well-developed and well-nourished.  HENT:  Head: Normocephalic and atraumatic.  Eyes: Pupils are equal, round, and reactive to light. Conjunctivae and EOM are normal. Right eye exhibits no discharge. Left eye exhibits no discharge. No scleral icterus.  Neck: Normal range of motion. Neck supple. No JVD present.  Cardiovascular: Normal rate,  regular rhythm and normal heart sounds.  Exam reveals no gallop and no friction rub.   No murmur heard. Pulmonary/Chest: Effort normal and breath sounds normal. No respiratory distress. He has no wheezes. He has no rales. He exhibits no tenderness.  Abdominal: Soft. He exhibits no distension and no mass. There is no tenderness. There is no rebound and no guarding.  Musculoskeletal: Normal range of motion. He exhibits no edema or tenderness.  Neurological: He is alert and oriented to person, place, and time.  Skin:  Skin is warm and dry.  Psychiatric: He has a normal mood and affect. His behavior is normal. Judgment and thought content normal.  Nursing note and vitals reviewed.    ED Treatments / Results  Labs (all labs ordered are listed, but only abnormal results are displayed) Labs Reviewed  CBC  COMPREHENSIVE METABOLIC PANEL  ETHANOL  RAPID URINE DRUG SCREEN, HOSP PERFORMED    EKG  EKG Interpretation None       Radiology No results found.  Procedures Procedures (including critical care time)  Medications Ordered in ED Medications - No data to display   Initial Impression / Assessment and Plan / ED Course  I have reviewed the triage vital signs and the nursing notes.  Pertinent labs & imaging results that were available during my care of the patient were reviewed by me and considered in my medical decision making (see chart for details).     Patient with hallucinations.  No SI or HI.  TTS recommends observation and re-evaluation.    Final Clinical Impressions(s) / ED Diagnoses   Final diagnoses:  Hallucinations    New Prescriptions New Prescriptions   No medications on file     Roxy Horseman, Cordelia Poche 09/22/17 2725    Geoffery Lyons, MD 09/22/17 [phone]

## 2017-09-22 NOTE — ED Notes (Signed)
Regular Diet ordered for Lunch. 

## 2017-09-22 NOTE — ED Notes (Signed)
Pt belongings has been inventoried put in locker 4.

## 2017-09-22 NOTE — ED Triage Notes (Signed)
Patient here due to hearing voices, he states that they want to kill him.  He denies any SI or HI at this time.  Patient states that he does not take any meds at this time.  He states that he has been hearing voices for awhile now.

## 2017-09-22 NOTE — ED Notes (Signed)
TTS  has been completed. 

## 2017-09-22 NOTE — ED Notes (Signed)
Breakfast tray at bedside 

## 2017-09-22 NOTE — ED Notes (Signed)
Patient was given a Drink and Snack. 

## 2017-09-22 NOTE — ED Notes (Signed)
Pt care assumed.  Pt is resting, appears comfortable.  P[facility] at 1300

## 2017-09-22 NOTE — Progress Notes (Signed)
Cole Barrett is a 31 year old male being admitted voluntarily to 306-1 from [facility].  He came to the ED intoxicated and hearing voices that are telling him they are going to kill him.  He has history of Schizophrenia and Alcohol induced Mood Disorder.  He has history of previous psychiatric hospitalizations with poor follow up on discharge.  No current OP provider.  [facility], he denied SI/HI but admitted to hearing voices telling him they are going to kill him.  He denied any vision.  He denies any medical issues and appears to be in no physical distress. Oriented him to the unit.  Admission paperwork completed and signed.  Belongings searched and secured in locker # 37.  Skin assessment completed and no skin issues noted.  Q 15 minute checks initiated for safety.  We will monitor the progress towards his goals.

## 2017-09-22 NOTE — Tx Team (Signed)
Initial Treatment Plan 09/22/2017 3:14 PM Cole HawkingChristopher Adam Barrett [id number]    PATIENT STRESSORS: Financial difficulties Medication change or noncompliance Other: Homeless   PATIENT STRENGTHS: General fund of knowledge Motivation for treatment/growth Physical Health   PATIENT IDENTIFIED PROBLEMS: Psychosis  Substance abuse  "Work on the voices"  "Take some medicine"  "Services for when I leave"             DISCHARGE CRITERIA:  Motivation to continue treatment in a less acute level of care Verbal commitment to aftercare and medication compliance Withdrawal symptoms are absent or subacute and managed without 24-hour nursing intervention  PRELIMINARY DISCHARGE PLAN: Outpatient therapy Medication management  PATIENT/FAMILY INVOLVEMENT: This treatment plan has been presented to and reviewed with the patient, Cole HawkingChristopher Adam Barrett.  The patient and family have been given the opportunity to ask questions and make suggestions.  Levin BaconHeather V Nguyen Butler, RN 09/22/2017, 3:14 PM

## 2017-09-22 NOTE — Progress Notes (Signed)
Per Cole Barrett , [facility], [facility] ; Accepting provider is Assunta FoundShuvon Rankin, NP; Attending provider is Dr. Jama Flavorsobos.  Patient can arrive at 1:00pm. Number for report is [phone].   Cole BushyHayden Morrison, RN notified. Agreed to fax voluntary consent form to [facility] if patient is willing.   Baldo DaubJolan Jayonna Barrett MSW, LCSWA CSW Disposition [phone]

## 2017-09-22 NOTE — ED Notes (Signed)
[facility] contacted for transport to [facility].  

## 2017-09-23 ENCOUNTER — Encounter (HOSPITAL_COMMUNITY): Payer: Self-pay | Admitting: Psychiatry

## 2017-09-23 DIAGNOSIS — F1010 Alcohol abuse, uncomplicated: Secondary | ICD-10-CM

## 2017-09-23 DIAGNOSIS — F329 Major depressive disorder, single episode, unspecified: Principal | ICD-10-CM

## 2017-09-23 DIAGNOSIS — F17210 Nicotine dependence, cigarettes, uncomplicated: Secondary | ICD-10-CM

## 2017-09-23 DIAGNOSIS — R440 Auditory hallucinations: Secondary | ICD-10-CM

## 2017-09-23 MED ORDER — BENZTROPINE MESYLATE 0.5 MG PO TABS
0.5000 mg | ORAL_TABLET | Freq: Every day | ORAL | Status: DC
  Administered 2017-09-23 – 2017-09-25 (×3): 0.5 mg via ORAL
  Filled 2017-09-23 (×2): qty 1
  Filled 2017-09-23: qty 7
  Filled 2017-09-23 (×4): qty 1

## 2017-09-23 MED ORDER — RISPERIDONE 1 MG PO TABS
1.0000 mg | ORAL_TABLET | Freq: Every day | ORAL | Status: DC
  Administered 2017-09-23 – 2017-09-25 (×3): 1 mg via ORAL
  Filled 2017-09-23: qty 1
  Filled 2017-09-23: qty 7
  Filled 2017-09-23 (×5): qty 1

## 2017-09-23 NOTE — Progress Notes (Signed)
Adult Psychoeducational Group Note  Date:  09/23/2017 Time:  2:56 AM  Group Topic/Focus:  Wrap-Up Group:   The focus of this group is to help patients review their daily goal of treatment and discuss progress on daily workbooks.  Participation Level:  Did Not Attend  Participation Quality:  Did not attend  Affect:  Did not attend  Cognitive:  Did not attend  Insight: None  Engagement in Group:  Did not attend  Modes of Intervention:  Did not attend  Additional Comments:  Pt did not attend the evening AA session.  Cole FurnaceChristopher  Cole Barrett 09/23/2017, 2:56 AM

## 2017-09-23 NOTE — Progress Notes (Signed)
D   Pt is suspicious and guarded but has had appropriate behaviors toward others    He endorses some voices but they have decreased    His interactions are very minimal but he is visible on the milieu  Pt takes his medications and asks appropriate questions regarding medicine A   Verbal support and encouragement given   Medications administered and effectiveness monitored   Q 15 min checks R   Pt is safe at present

## 2017-09-23 NOTE — Progress Notes (Signed)
D. Pt in bed upon initial approach- easily aroused from sleep. Pt presents with a flat affect and guarded behavior. Pt compliant with meds.Pt currently denies SI/HI at this time, but endorses auditory hallucinations. A. Labs and vitals monitored. Pt compliant with medications. Pt offered support and encouragement. R. Pt remains safe with 15 minute checks. Will continue POC.

## 2017-09-23 NOTE — [facility] Suicide Risk Assessment (Signed)
[facility] INPATIENT:  Family/Significant Other Suicide Prevention Education  Suicide Prevention Education:  Patient Refusal for Family/Significant Other Suicide Prevention Education: The patient Cole Barrett has refused to provide written consent for family/significant other to be provided Family/Significant Other Suicide Prevention Education during admission and/or prior to discharge.  Physician notified.  Carloyn JaegerMareida J Grossman-Orr 09/23/2017, 2:54 PM

## 2017-09-23 NOTE — [facility] Counselor (Signed)
Adult Comprehensive Assessment  Patient ID: Cole HawkingChristopher Adam Barrett, male   DOB: 07/05/1986, 31 y.o.   MRN: [id number]  Information Source: Information source: Patient  Current Stressors:  Educational / Learning stressors: Denies stressors Employment / Job issues: Denies stressors Family Relationships: Denies Chief Technology Officerstressors Financial / Lack of resources (include bankruptcy): Denies stressors Housing / Lack of housing: Denies stressors Physical health (include injuries & life threatening diseases): Denies stressors Social relationships: Denies stressors Substance abuse: Denies stressors Bereavement / Loss: Denies stressors  Living/Environment/Situation:  Living Arrangements: Alone Living conditions (as described by patient or guardian): Good, house How long has patient lived in current situation?: 2 years What is atmosphere in current home: Comfortable, Supportive  Family History:  Marital status: Single Does patient have children?: No  Childhood History:  By whom was/is the patient raised?: Mother Description of patient's relationship with caregiver when they were a child: Mother - fine; Father - fine Patient's description of current relationship with people who raised him/her: Mother - "cool still", Father - cool How were you disciplined when you got in trouble as a child/adolescent?: "They ain't did nothing." Does patient have siblings?: Yes Number of Siblings: 1 Description of patient's current relationship with siblings: Brother - get along "cool" Did patient suffer any verbal/emotional/physical/sexual abuse as a child?: No Did patient suffer from severe childhood neglect?: No Has patient ever been sexually abused/assaulted/raped as an adolescent or adult?: No Was the patient ever a victim of a crime or a disaster?: No Witnessed domestic violence?: No Has patient been effected by domestic violence as an adult?: No  Education:  Highest grade of school patient has completed:  High school Currently a student?: No  Employment/Work Situation:   Employment situation: Employed (Pt's assessment says he is on disability and he states he works at [employer]) 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 How long has patient been employed?: 2 years Patient's job has been impacted by current illness: No What is the longest time patient has a held a job?: 3 years 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 Has patient ever been in the Eli Lilly and Companymilitary?: No Are There Guns or Other Weapons in Your Home?: No  Financial Resources:   Financial resources: Income from employment, Scientist, research (medical)eceives workman's compensation Does patient have a Lawyerrepresentative payee or guardian?: No  Alcohol/Substance Abuse:   What has been your use of drugs/alcohol within the last 12 months?: Marijuana and alcohol Alcohol/Substance Abuse Treatment Hx: Denies past history Has alcohol/substance abuse ever caused legal problems?: No  Social Support System:   Conservation officer, natureatient's Community Support System: Poor Describe Community Support System: "Me" Type of faith/religion: None How does patient's faith help to cope with current illness?: N/A  Leisure/Recreation:   Leisure and Hobbies: Plays sports  Strengths/Needs:   What things does the patient do well?: Football, basketball In what areas does patient struggle / problems for patient: "Nothing much"  Discharge Plan:   Does patient have access to transportation?: Yes (Cab, bus or walk) Will patient be returning to same living situation after discharge?: Yes Currently receiving community mental health services: No If no, would patient like referral for services when discharged?: Yes (What county?) ([place], no insurance) Does patient have financial barriers related to discharge medications?: Yes Patient description of barriers related to discharge medications: Limited income, no insurance  Summary/Recommendations:   Summary  and Recommendations (to be completed by the evaluator): Patient is a 31yo male admitted hearing voices tell him they are going  to kill him, denying SI/HI.  Primary stressors are not identified.  He has been seen in the ED 14 times in 2018 for mental health symptoms and 6 times for jaw pain and sprained ankle.  At admission he had a BAL of 43, states he uses alcohol and marijuana but does not have a problem with them.  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  He has no outpatient providers.  Patient will benefit from crisis stabilization, medication evaluation, group therapy and psychoeducation, in addition to case management for discharge planning. At discharge it is recommended that Patient adhere to the established discharge plan and continue in treatment.  Lynnell Chad. 09/23/2017

## 2017-09-23 NOTE — H&P (Addendum)
Psychiatric Admission Assessment Adult  Patient Identification: Cole Barrett MRN:  [id number] Date of Evaluation:  09/23/2017 Chief Complaint:  Distressing auditory hallucination Principal Diagnosis: Schizophrenia  Diagnosis:   Patient Active Problem List   Diagnosis Date Noted  . MDD (major depressive disorder) [F32.9] 09/22/2017  . Alcohol abuse [F10.10]   . Auditory hallucinations [R44.0]   . Alcohol abuse with alcohol-induced mood disorder ([place]) [F10.14] 09/08/2016  . Adjustment disorder with depressed mood [F43.21] 09/07/2016  . Homelessness [Z59.0] 09/03/2016  . Person feigning illness [Z76.5] 09/03/2016  . 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 Cannabis use disorder, mild, abuse [F12.10] 08/29/2016  . Paranoid schizophrenia ([place]) [F20.0] 08/26/2016  . Pulmonary emboli ([place]) [I26.99] 07/13/2016  . Pulmonary embolism and infarction ([place]) [I26.99] 01/02/2014  . Acute left flank pain [R10.9] 01/02/2014  . Pulmonary embolism, bilateral ([place]) [I26.99] 01/02/2014   History of Present Illness:  [name], single, lives alone, employed. Background history of Schizophrenia and SUD. Presented to the ER unaccompanied. Reports distressing auditory hallucinations. Expressed persecutory and paranoid delusion. Scared that his life is in danger. Has not been compliant with medications or follow up. Routine labs are essentially normal.  UDS is negative.  BAL143 mg/dl. Patient has had multiple ER visits this year.  At interview, patient is very vague about the voices. Not sure how many they are. Says he has been hearing hem for the past two years. Says they got intense on Friday. No new stressors. Says he has been functioning at work and at home. Says he works for [employer]. Says he was at work last Friday. No effects on his sleep or eating pattern. No behavioral changes recently.  Patient repeatedly stated that he just wants to be in a safe place so that the voices can  get better. Says it has gotten less since he has been here. Says he was given Risperidone in the past and it helped. Would like to restart Risperidone. No recent substance use. Says he typically uses natural THC occassionally. Patient is not voicing any paranoia. He just got out of groups. Says he is at peace here. No suicidal thoughts. No homicidal thoughts. No thoughts of violence. No acces to weapons.    Total Time spent with patient: 1 hour  Past Psychiatric History: SUD and psychosis. Linked to [employer]. Does not follow up with appointments. Has not been adherent with recommended treatment. No past suicidal behavior. No past history of violent behavior.   Is the patient at risk to self? No.  Has the patient been a risk to self in the past 6 months? No.  Has the patient been a risk to self within the distant past? No.  Is the patient a risk to others? No.  Has the patient been a risk to others in the past 6 months? No.  Has the patient been a risk to others within the distant past? No.   Prior Inpatient Therapy:   Prior Outpatient Therapy:    Alcohol Screening: 1. How often do you have a drink containing alcohol?: 2 to 3 times a week 2. How many drinks containing alcohol do you have on a typical day when you are drinking?: 7, 8, or 9 3. How often do you have six or more drinks on one occasion?: Weekly Preliminary Score: 6 4. How often during the last year have you found that you were not able to stop drinking once you had started?: Never 5. How often during the last year have  you failed to do what was normally expected from you becasue of drinking?: Never 6. How often during the last year have you needed a first drink in the morning to get yourself going after a heavy drinking session?: Never 7. How often during the last year have you had a feeling of guilt of remorse after drinking?: Never 8. How often during the last year have you been unable to remember what happened the night before  because you had been drinking?: Never 9. Have you or someone else been injured as a result of your drinking?: No 10. Has a relative or friend or a doctor or another health worker been concerned about your drinking or suggested you cut down?: No Alcohol Use Disorder Identification Test Final Score (AUDIT): 9 Brief Intervention: Yes Substance Abuse History in the last 12 months:  Yes.   Consequences of Substance Abuse: NA Previous Psychotropic Medications: Yes  Psychological Evaluations: Yes  Past Medical History:  Past Medical History:  Diagnosis Date  . Homelessness   . Hypercholesterolemia   . Mental disorder   . Paranoid behavior ([place])   . PE (pulmonary embolism)   . Schizophrenia ([place])    History reviewed. No pertinent surgical history. Family History:  Family History  Problem Relation Age of Onset  . Hypertension Mother    Family Psychiatric  History: Denies any family history of mental illness. Or suicide. Denies any family history of addiction.  Tobacco Screening: Have you used any form of tobacco in the last 30 days? (Cigarettes, Smokeless Tobacco, Cigars, and/or Pipes): Yes Tobacco use, Select all that apply: 5 or more cigarettes per day Are you interested in Tobacco Cessation Medications?: No, patient refused Counseled patient on smoking cessation including recognizing danger situations, developing coping skills and basic information about quitting provided: Refused/Declined practical counseling Social History:  History  Alcohol Use  . Yes    Comment: socially     History  Drug Use  . Types: Marijuana    Comment: Once every 2-3 months, Last used: Last month    Additional Social History:        Allergies:   Allergies  Allergen Reactions  . Haldol [Haloperidol] Shortness Of Breath   Lab Results:  Results for orders placed or performed during the hospital encounter of 09/22/17 (from the past 48 hour(s))  Comprehensive metabolic panel     Status: Abnormal    Collection Time: 09/22/17  2:49 AM  Result Value Ref Range   Sodium 138 135 - 145 mmol/L   Potassium 3.3 (L) 3.5 - 5.1 mmol/L   Chloride 105 101 - 111 mmol/L   CO2 22 22 - 32 mmol/L   Glucose, Bld 87 65 - 99 mg/dL   BUN 8 6 - 20 mg/dL   Creatinine, Ser 0.90 0.61 - 1.24 mg/dL   Calcium 8.8 (L) 8.9 - 10.3 mg/dL   Total Protein 7.6 6.5 - 8.1 g/dL   Albumin 4.2 3.5 - 5.0 g/dL   AST 33 15 - 41 U/L   ALT 47 17 - 63 U/L   Alkaline Phosphatase 51 38 - 126 U/L   Total Bilirubin 0.3 0.3 - 1.2 mg/dL   GFR calc non Af Amer >60 >60 mL/min   GFR calc Af Amer >60 >60 mL/min    Comment: (NOTE) The eGFR has been calculated using the CKD EPI equation. This calculation has not been validated in all clinical situations. eGFR's persistently <60 mL/min signify possible Chronic Kidney Disease.    Anion gap  11 5 - 15  Ethanol     Status: Abnormal   Collection Time: 09/22/17  2:49 AM  Result Value Ref Range   Alcohol, Ethyl (B) 143 (H) <10 mg/dL    Comment:        LOWEST DETECTABLE LIMIT FOR SERUM ALCOHOL IS 10 mg/dL FOR MEDICAL PURPOSES ONLY   cbc     Status: None   Collection Time: 09/22/17  2:49 AM  Result Value Ref Range   WBC 6.3 4.0 - 10.5 K/uL   RBC 4.85 4.22 - 5.81 MIL/uL   Hemoglobin 13.7 13.0 - 17.0 g/dL   HCT 40.9 39.0 - 52.0 %   MCV 84.3 78.0 - 100.0 fL   MCH 28.2 26.0 - 34.0 pg   MCHC 33.5 30.0 - 36.0 g/dL   RDW 12.9 11.5 - 15.5 %   Platelets 301 150 - 400 K/uL  Rapid urine drug screen (hospital performed)     Status: None   Collection Time: 09/22/17  2:52 AM  Result Value Ref Range   Opiates NONE DETECTED NONE DETECTED   Cocaine NONE DETECTED NONE DETECTED   Benzodiazepines NONE DETECTED NONE DETECTED   Amphetamines NONE DETECTED NONE DETECTED   Tetrahydrocannabinol NONE DETECTED NONE DETECTED   Barbiturates NONE DETECTED NONE DETECTED    Comment:        DRUG SCREEN FOR MEDICAL PURPOSES ONLY.  IF CONFIRMATION IS NEEDED FOR ANY PURPOSE, NOTIFY LAB WITHIN 5 DAYS.         LOWEST DETECTABLE LIMITS FOR URINE DRUG SCREEN Drug Class       Cutoff (ng/mL) Amphetamine      1000 Barbiturate      200 Benzodiazepine   132 Tricyclics       440 Opiates          300 Cocaine          300 THC              50     Blood Alcohol level:  Lab Results  Component Value Date   ETH 143 (H) 09/22/2017   ETH 68 (H) 09/24/2535    Metabolic Disorder Labs:  Lab Results  Component Value Date   HGBA1C 5.2 08/27/2016   MPG 103 08/27/2016   Lab Results  Component Value Date   PROLACTIN 34.1 (H) 08/27/2016   Lab Results  Component Value Date   CHOL 160 08/27/2016   TRIG 199 (H) 08/27/2016   HDL 39 (L) 08/27/2016   CHOLHDL 4.1 08/27/2016   VLDL 40 08/27/2016   LDLCALC 81 08/27/2016   LDLCALC 124 (H) 03/13/2014    Current Medications: Current Facility-Administered Medications  Medication Dose Route Frequency Provider Last Rate Last Dose  . acetaminophen (TYLENOL) tablet 650 mg  650 mg Oral Q6H PRN Rankin, Shuvon B, NP      . alum & mag hydroxide-simeth (MAALOX/MYLANTA) 200-200-20 MG/5ML suspension 30 mL  30 mL Oral Q4H PRN Rankin, Shuvon B, NP      . benztropine (COGENTIN) tablet 1 mg  1 mg Oral QHS Rankin, Shuvon B, NP   1 mg at 09/22/17 2201  . FLUoxetine (PROZAC) tablet 20 mg  20 mg Oral Daily Rankin, Shuvon B, NP   20 mg at 09/23/17 0815  . magnesium hydroxide (MILK OF MAGNESIA) suspension 30 mL  30 mL Oral Daily PRN Rankin, Shuvon B, NP      . risperiDONE (RISPERDAL) tablet 3 mg  3 mg Oral QHS Rankin, Shuvon B, NP  3 mg at 09/22/17 2201   PTA Medications: Prescriptions Prior to Admission  Medication Sig Dispense Refill Last Dose  . benztropine (COGENTIN) 1 MG tablet Take 1 tablet (1 mg total) by mouth at bedtime. (Patient not taking: Reported on 09/22/2017) 10 tablet 0 Not Taking at Unknown time  . FLUoxetine (PROZAC) 20 MG tablet Take 1 tablet (20 mg total) by mouth daily. (Patient not taking: Reported on 09/22/2017) 30 tablet 0 Not Taking at Unknown time   . nicotine (NICODERM CQ - DOSED IN MG/24 HOURS) 21 mg/24hr patch Place 1 patch (21 mg total) onto the skin daily. (Patient not taking: Reported on 06/10/2017) 28 patch 0 Not Taking at Unknown time  . risperiDONE (RISPERDAL) 3 MG tablet Take 1 tablet (3 mg total) by mouth at bedtime. (Patient not taking: Reported on 09/22/2017) 10 tablet 0 Not Taking at Unknown time    Musculoskeletal: Strength & Muscle Tone: within normal limits Gait & Station: normal Patient leans: N/A  Psychiatric Specialty Exam: Physical Exam  ROS  Blood pressure 137/79, pulse 73, temperature 98 F (36.7 C), temperature source Oral, resp. rate 18, height '5\' 10"'$  (1.778 m), weight 96.2 kg (212 lb), SpO2 100 %.Body mass index is 30.42 kg/m.  General Appearance: In hospital clothing, malodorous, pleasant and polite. Eager to establish that he just needs to be here for a couple of days. Does not appear internally distracted.   Eye Contact:  Good  Speech:  Clear and Coherent and Normal Rate  Volume:  Normal  Mood:  Subjectively reports being overwhelmed by voices. Objectively euthymic  Affect:  Appropriate and Full Range  Thought Process:  Linear  Orientation:  Full (Time, Place, and Person)  Thought Content: Paranoid and persecutory delusion. No preoccupation with violent thoughts. No negative ruminations. No obsession.  No hallucination in any modality.   Suicidal Thoughts:  No  Homicidal Thoughts:  No  Memory:  Immediate;   Good Recent;   Good Remote;   Good  Judgement:  Good  Insight:  Good  Psychomotor Activity:  Normal  Concentration:  Concentration: Good and Attention Span: Good  Recall:  Good  Fund of Knowledge:  Good  Language:  Good  Akathisia:  Negative  Handed:    AIMS (if indicated):     Assets:  Communication Skills Desire for Improvement Housing Physical Health Resilience Vocational/Educational  ADL's:  Impaired  Cognition:  WNL  Sleep:  Number of Hours: 6.25    Treatment Plan  Summary: Patient was intoxicated with alcohol at presentation. He reports persecutory delusion and auditory hallucination. Symptoms do not match his behavior. No dangerousness. He has been restarted on Risperidone, Fluoxetine and Cogentin. We plan to evaluate him further.  Psychiatric: Psychotic Disorder ,,,, ? Substance induced SUD  Medical: PE  Psychosocial:   PLAN: 1. Alcohol withdrawal protocol 2. Decrease Risperidone to 1 mg HS 3. Decrease Cogentin to 0.5 mg HS 4. Continue Fluoxetine at 20 mg daily 5. Encourage unit groups and activities 6. Monitor mood, behavior and interaction with peers 7. SW would facilitate aftercare.    Observation Level/Precautions:  Detox  Laboratory:    Psychotherapy:    Medications:    Consultations:    Discharge Concerns:    Estimated LOS:  Other:     Physician Treatment Plan for Primary Diagnosis: <principal problem not specified> Long Term Goal(s): Improvement in symptoms so as ready for discharge  Short Term Goals: Ability to identify changes in lifestyle to reduce recurrence of condition will improve, Ability  to verbalize feelings will improve, Ability to disclose and discuss suicidal ideas, Ability to demonstrate self-control will improve, Ability to identify and develop effective coping behaviors will improve, Ability to maintain clinical measurements within normal limits will improve, Compliance with prescribed medications will improve and Ability to identify triggers associated with substance abuse/mental health issues will improve  Physician Treatment Plan for Secondary Diagnosis: Active Problems:   MDD (major depressive disorder)  Long Term Goal(s): Improvement in symptoms so as ready for discharge  Short Term Goals: Ability to identify changes in lifestyle to reduce recurrence of condition will improve, Ability to verbalize feelings will improve, Ability to disclose and discuss suicidal ideas, Ability to demonstrate self-control  will improve, Ability to identify and develop effective coping behaviors will improve, Ability to maintain clinical measurements within normal limits will improve, Compliance with prescribed medications will improve and Ability to identify triggers associated with substance abuse/mental health issues will improve  I certify that inpatient services furnished can reasonably be expected to improve the patient's condition.    [place], MD 10/27/201811:47 AM

## 2017-09-23 NOTE — [facility] Group Notes (Signed)
[facility] LCSW Group Therapy Note  Date/Time:    09/23/2017 :15-2:15PM  Type of Therapy and Topic:  Group Therapy:  Healthy vs Unhealthy Coping Skills  Participation Level:  Did Not Attend - was present at the beginning of group but called out to see a provider  Description of Group:  The focus of this group was to determine what unhealthy coping techniques typically are used by group members and what healthy coping techniques would be helpful in coping with various problems. Patients were guided in becoming aware of the differences between healthy and unhealthy coping techniques.  Patients were asked to identify 1-2 healthy coping skills they would like to learn to use more effectively, and many mentioned meditation, breathing, and relaxation.  These were explained, samples demonstrated, and resources shared for how to learn more at discharge.   At the end of group, additional ideas of healthy coping skills were shared in a fun exercise.  Therapeutic Goals 1. Patients learned that coping is what human beings do all day long to deal with various situations in their lives 2. Patients defined and discussed healthy vs unhealthy coping techniques 3. Patients identified their preferred coping techniques and identified whether these were healthy or unhealthy 4. Patients determined 1-2 healthy coping skills they would like to become more familiar with and use more often, and practiced a few meditations 5. Patients provided support and ideas to each other  Summary of Patient Progress: During group, patient expressed N/A   Therapeutic Modalities Cognitive Behavioral Therapy Motivational Interviewing   Ambrose MantleMareida Grossman-Orr, LCSW  09/23/2017 2:15pm

## 2017-09-23 NOTE — Progress Notes (Signed)
Patient did attend the evening speaker AA meeting.  

## 2017-09-23 NOTE — [facility] Suicide Risk Assessment (Signed)
[facility] Admission Suicide Risk Assessment   Nursing information obtained from:  Patient Demographic factors:  Male, Low socioeconomic status, Unemployed, Living alone Current Mental Status:  NA Loss Factors:  Financial problems / change in socioeconomic status Historical Factors:  NA Risk Reduction Factors:  NA  Total Time spent with patient: 30 minutes Principal Problem: Auditory hallucinations Diagnosis:   Patient Active Problem List   Diagnosis Date Noted  . MDD (major depressive disorder) [F32.9] 09/22/2017  . Alcohol abuse [F10.10]   . Auditory hallucinations [R44.0]   . Alcohol abuse with alcohol-induced mood disorder (HCC) [F10.14] 09/08/2016  . Adjustment disorder with depressed mood [F43.21] 09/07/2016  . Homelessness [Z59.0] 09/03/2016  . Person feigning illness [Z76.5] 09/03/2016  . 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 Cannabis use disorder, mild, abuse [F12.10] 08/29/2016  . Paranoid schizophrenia (HCC) [F20.0] 08/26/2016  . Pulmonary emboli (HCC) [I26.99] 07/13/2016  . Pulmonary embolism and infarction (HCC) [I26.99] 01/02/2014  . Acute left flank pain [R10.9] 01/02/2014  . Pulmonary embolism, bilateral (HCC) [I26.99] 01/02/2014   Subjective Data:  [name], single, lives alone, employed. Background history of Schizophrenia and SUD. Presented to the ER unaccompanied. Reports distressing auditory hallucinations. Expressed persecutory and paranoid delusion. Scared that his life is in danger. Has not been compliant with medications or follow up. Routine labs are essentially normal.  UDS is negative.  BAL143 mg/dl. Patient has had multiple ER visits this year. No associated suicidal or homicidal thoughts. No past history of suicidal behavior. He has housing, no access to weapons. He is gainfully employed and plans to get back to work. He is pleasant and cooperative. No evidence of internal stimulation. He has agreed to be started on Risperidone.    Continued  Clinical Symptoms:  Alcohol Use Disorder Identification Test Final Score (AUDIT): 9 The "Alcohol Use Disorders Identification Test", Guidelines for Use in Primary Care, Second Edition.  World Science writer [facility]). Score between 0-7:  no or low risk or alcohol related problems. Score between 8-15:  moderate risk of alcohol related problems. Score between 16-19:  high risk of alcohol related problems. Score 20 or above:  warrants further diagnostic evaluation for alcohol dependence and treatment.   CLINICAL FACTORS:   Alcohol/Substance Abuse/Dependencies   Musculoskeletal: Strength & Muscle Tone: within normal limits Gait & Station: normal Patient leans: N/A  Psychiatric Specialty Exam: Physical Exam  ROS  Blood pressure 137/79, pulse 73, temperature 98 F (36.7 C), temperature source Oral, resp. rate 18, height 5\' 10"  (1.778 m), weight 96.2 kg (212 lb), SpO2 100 %.Body mass index is 30.42 kg/m.  General Appearance: As in H&P  Eye Contact:  As in H&P  Speech:  As in H&P  Volume:  As in H&P  Mood:  As in H&P  Affect:  As in H&P  Thought Process:  As in H&P  Orientation:  As in H&P  Thought Content:  As in H&P  Suicidal Thoughts:  As in H&P  Homicidal Thoughts:  As in H&P  Memory:  As in H&P  Judgement:  As in H&P  Insight:  As in H&P  Psychomotor Activity:  As in H&P  Concentration:  As in H&P  Recall:  As in H&P  Fund of Knowledge:  As in H&P  Language:  As in H&P  Akathisia:  As in H&P  Handed:  As in H&P  AIMS (if indicated):     Assets:  As in H&P  ADL's:  As in H&P  Cognition:  As in H&P  Sleep:  Number of Hours: 6.25      COGNITIVE FEATURES THAT CONTRIBUTE TO RISK:  None    SUICIDE RISK:   Minimal: No identifiable suicidal ideation.  Patients presenting with no risk factors but with morbid ruminations; may be classified as minimal risk based on the severity of the depressive symptoms  PLAN OF CARE:  As in H&P  I certify that inpatient services  furnished can reasonably be expected to improve the patient's condition.   Cole CockerVincent A Brendi Mccarroll, MD 09/23/2017, 2:07 PM

## 2017-09-24 NOTE — Progress Notes (Signed)
D. Pt sitting quietly in dayroom upon initial approach. Pt is calm and cooperative, but is somewhat guarded with minimal interaction with others. Pt completed self inventory with encouragement.  Pt currently denies SI/HI . Pt endorses auditory hallucinations, but reports that they have improved A. Labs and vitals monitored. Pt compliant with medications. Pt supported emotionally and encouraged to express concerns and ask questions.   R. Pt remains safe with 15 minute checks. Will continue POC.

## 2017-09-24 NOTE — Progress Notes (Signed)
[facility] MD Progress Note  09/24/2017 12:24 PM Cole Barrett  MRN:  [id number] Subjective:   [name], single, lives alone, employed. Background history of Schizophrenia and SUD. Presented to the ER unaccompanied. Reports distressing auditory hallucinations. Expressed persecutory and paranoid delusion. Scared that his life is in danger. Has not been compliant with medications or follow up. Routine labs are essentially normal.  UDS is negative.  BAL143 mg/dl. Patient has had multiple ER visits this year.  Chart reviewed today. Patient discussed at team today.  Staff reports that he is coming out for groups. He is interacting with peers. Reports hallucinations but has not been observed to be distracted by internal stimuli. Has not voiced any futility thoughts. Has not had a shower and remains malodorous. Has been tolerating his medication well.   Seen today. Pleasant and compassionate. Wants to know how I am doing. Says he is doing fine. Says being here is right for him at this time. Remains vague about the voices. Denies any command to hurt self lately. No suicidal thoughts. No thoughts of violence. No homicidal thoughts. No side effects from his medication. Patient continues to maintain that he has his own place. Says he would follow up at [facility] "as needed". Encouraged to stay on medications and follow up as arranged.   Principal Problem: Auditory hallucinations Diagnosis:   Patient Active Problem List   Diagnosis Date Noted  . MDD (major depressive disorder) [F32.9] 09/22/2017  . Alcohol abuse [F10.10]   . Auditory hallucinations [R44.0]   . Alcohol abuse with alcohol-induced mood disorder (HCC) [F10.14] 09/08/2016  . Adjustment disorder with depressed mood [F43.21] 09/07/2016  . Homelessness [Z59.0] 09/03/2016  . Person feigning illness [Z76.5] 09/03/2016  . 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 Cannabis use disorder, mild, abuse [F12.10] 08/29/2016  . 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 Pulmonary emboli (HCC) [I26.99] 07/13/2016  . Pulmonary embolism and infarction (HCC) [I26.99] 01/02/2014  . Acute left flank pain [R10.9] 01/02/2014  . Pulmonary embolism, bilateral (HCC) [I26.99] 01/02/2014   Total Time spent with patient: 20 minutes  Past Psychiatric History: As in H&P  Past Medical History:  Past Medical History:  Diagnosis Date  . Homelessness   . Hypercholesterolemia   . Mental disorder   . Paranoid behavior (HCC)   . PE (pulmonary embolism)   . Schizophrenia (HCC)    History reviewed. No pertinent surgical history. Family History:  Family History  Problem Relation Age of Onset  . Hypertension Mother    Family Psychiatric  History: As in H&P Social History:  History  Alcohol Use  . Yes    Comment: socially     History  Drug Use  . Types: Marijuana    Comment: Once every 2-3 months, Last used: Last month    Social History   Social History  . Marital status: Single    Spouse name: N/A  . Number of children: N/A  . Years of education: N/A   Social History Main Topics  . Smoking status: Current Every Day Smoker    Packs/day: 0.50    Types: Cigarettes  . Smokeless tobacco: Never Used  . Alcohol use Yes     Comment: socially  . Drug use: Yes    Types: Marijuana     Comment: Once every 2-3 months, Last used: Last month  . Sexual activity: No   Other Topics Concern  . None   Social History Narrative  . None   Additional  Social History:                         Sleep: Good  Appetite:  Good  Current Medications: Current Facility-Administered Medications  Medication Dose Route Frequency Provider Last Rate Last Dose  . acetaminophen (TYLENOL) tablet 650 mg  650 mg Oral Q6H PRN Rankin, Shuvon B, NP      . alum & mag hydroxide-simeth (MAALOX/MYLANTA) 200-200-20 MG/5ML suspension 30 mL  30 mL Oral Q4H PRN Rankin, Shuvon B, NP      . benztropine (COGENTIN) tablet 0.5 mg  0.5 mg Oral  QHS Pualani Borah A, MD   0.5 mg at 09/23/17 2115  . FLUoxetine (PROZAC) tablet 20 mg  20 mg Oral Daily Rankin, Shuvon B, NP   20 mg at 09/24/17 0808  . magnesium hydroxide (MILK OF MAGNESIA) suspension 30 mL  30 mL Oral Daily PRN Rankin, Shuvon B, NP      . risperiDONE (RISPERDAL) tablet 1 mg  1 mg Oral QHS Zamauri Nez, Delight OvensVincent A, MD   1 mg at 09/23/17 2115    Lab Results: No results found for this or any previous visit (from the past 48 hour(s)).  Blood Alcohol level:  Lab Results  Component Value Date   ETH 143 (H) 09/22/2017   ETH 68 (H) 08/10/2017    Metabolic Disorder Labs: Lab Results  Component Value Date   HGBA1C 5.2 08/27/2016   MPG 103 08/27/2016   Lab Results  Component Value Date   PROLACTIN 34.1 (H) 08/27/2016   Lab Results  Component Value Date   CHOL 160 08/27/2016   TRIG 199 (H) 08/27/2016   HDL 39 (L) 08/27/2016   CHOLHDL 4.1 08/27/2016   VLDL 40 08/27/2016   LDLCALC 81 08/27/2016   LDLCALC 124 (H) 03/13/2014    Physical Findings: AIMS: Facial and Oral Movements Muscles of Facial Expression: None, normal Lips and Perioral Area: None, normal Jaw: None, normal Tongue: None, normal,Extremity Movements Upper (arms, wrists, hands, fingers): None, normal Lower (legs, knees, ankles, toes): None, normal, Trunk Movements Neck, shoulders, hips: None, normal, Overall Severity Severity of abnormal movements (highest score from questions above): None, normal Incapacitation due to abnormal movements: None, normal Patient's awareness of abnormal movements (rate only patient's report): No Awareness, Dental Status Current problems with teeth and/or dentures?: No Does patient usually wear dentures?: No  CIWA:  CIWA-Ar Total: 0 COWS:     Musculoskeletal: Strength & Muscle Tone: within normal limits Gait & Station: normal Patient leans: N/A  Psychiatric Specialty Exam: Physical Exam  Constitutional: He is oriented to person, place, and time. He appears  well-developed and well-nourished.  HENT:  Head: Normocephalic and atraumatic.  Respiratory: Effort normal.  Neurological: He is alert and oriented to person, place, and time.  Psychiatric:  As above.     ROS  Blood pressure 137/79, pulse 73, temperature 98 F (36.7 C), temperature source Oral, resp. rate 18, height 5\' 10"  (1.778 m), weight 96.2 kg (212 lb), SpO2 100 %.Body mass index is 30.42 kg/m.  General Appearance: Poor personal hygiene, malodorous. Pleasant and engages well. Calm and cooperative. No EPS. No observable internal distraction.   Eye Contact:  Good  Speech:  Clear and Coherent and Normal Rate  Volume:  Normal  Mood:  Euthymic  Affect:  Appropriate and Full Range  Thought Process:  Linear  Orientation:  Full (Time, Place, and Person)  Thought Content:  No preoccupation with violent thoughts. No negative ruminations.  No obsession.     Suicidal Thoughts:  No  Homicidal Thoughts:  No  Memory:  Immediate;   Good Recent;   Good Remote;   Good  Judgement:  Fair  Insight:  Fair  Psychomotor Activity:  Normal  Concentration:  Concentration: Good and Attention Span: Good  Recall:  Good  Fund of Knowledge:  Good  Language:  Good  Akathisia:  Negative  Handed:    AIMS (if indicated):     Assets:  Communication Skills Desire for Improvement Physical Health Resilience  ADL's:  Impaired  Cognition:  WNL  Sleep:  Number of Hours: 4.75     Treatment Plan Summary: Patient is coming off alcohol well. Alcohol hallucinosis is resolving. No suicidal or homicidal thoughts. We plan to evaluate him further. Hopeful discharge early this week.   Psychiatric: Psychotic Disorder ,,,, ? Substance induced SUD  Medical: PE  Psychosocial:   PLAN: 1. Continue medications at current dose.  2. Encourage patient to groom self 3. Continue to encourage unit groups and activities 4. Continue to monitor mood, behavior and interaction with peers  Georgiann Cocker,  MD 09/24/2017, 12:24 PM

## 2017-09-24 NOTE — Progress Notes (Signed)
Patient did attend the evening speaker AA meeting.  

## 2017-09-24 NOTE — Progress Notes (Signed)
D: Patient pleasant and cooperative with care this shift, but is noted to appear paranoid of staff/peers in the milieu. Pt with minimal interaction with others. A: Encourage staff/peer interaction, medication compliance, and group participation. Administer medications as ordered, maintain Q 15 minute safety checks. R: Pt compliant with medications and attended group session. Pt denies SI at this time and verbally contracts for safety. No signs/symptoms of distress noted.

## 2017-09-24 NOTE — [facility] Group Notes (Signed)
[facility] LCSW Group Therapy Note  Date/Time:  09/24/2017   1:15-2:15pm  Type of Therapy and Topic:  Group Therapy:  Healthy and Unhealthy Supports  Participation Level:  Minimal   Description of Group:  Patients in this group were introduced to the idea of adding a variety of healthy supports to address the various needs in their lives.  Patients identified and described healthy supports versus unhealthy supports in general, then gave examples of each in their own lives.   They discussed what additional healthy supports could be helpful in their recovery and wellness after discharge in order to pursue the behaviors they identified as necessary to a life of wellness.   An emphasis was placed on using counselor, doctor, therapy groups, 12-step groups, and problem-specific support groups to expand supports.    Therapeutic Goals:   1)  discuss importance of adding supports to stay well once out of the hospital  2)  compare healthy versus unhealthy supports and identify some examples of each  3)  generate ideas and descriptions of healthy supports that can be added  4)  offer mutual support about how to address unhealthy supports  5)  encourage active participation in and adherence to discharge plan    Summary of Patient Progress:  The patient shared that the current unhealthy supports available in his life are "I don't have any", while the current healthy supports are himself.  The patient expressed a willingness to add nothing to help in his recovery journey.  He was unengaged in group.   Therapeutic Modalities:   Motivational Interviewing Brief Solution-Focused Therapy  Ambrose MantleMareida Grossman-Orr, LCSW 09/24/2017, 4:25pm

## 2017-09-24 NOTE — Plan of Care (Signed)
Problem: Activity: Goal: Interest or engagement in activities will improve Outcome: Progressing Pt visible on the milieu but keeps to himself  Problem: Health Behavior/Discharge Planning: Goal: Compliance with prescribed medication regimen will improve Outcome: Progressing Pt is compliant with medications

## 2017-09-25 NOTE — Progress Notes (Signed)
Recreation Therapy Notes  Date: 09/25/17 Time: 0930 Location: [address] Dayroom  Group Topic: Stress Management  Goal Area(s) Addresses:  Patient will verbalize importance of using healthy stress management.  Patient will identify positive emotions associated with healthy stress management.   Intervention: Stress Management  Activity :  Meditation.  LRT introduced the stress management technique of meditation.  LRT read script that allowed patients to sit quietly, focus on their breathing and just being.  Education:  Stress Management, Discharge Planning.   Education Outcome: Acknowledges edcuation/In group clarification offered/Needs additional education  Clinical Observations/Feedback: Pt did not attend group.    Caroll RancherMarjette Kiel Cockerell, LRT/CTRS         Lillia AbedLindsay, Tremaine Fuhriman A 09/25/2017 11:15 AM

## 2017-09-25 NOTE — Plan of Care (Signed)
Problem: Education: Goal: Verbalization of understanding the information provided will improve Outcome: Progressing Patient verbalizes understanding of information, education provided.  Problem: Safety: Goal: Periods of time without injury will increase Outcome: Completed/Met Date Met: 09/25/17 Patient has not engaged in self harm. Denies thoughts to do so. Denies SI. Verbal contract in place.

## 2017-09-25 NOTE — Progress Notes (Signed)
[facility] MD Progress Note  09/25/2017 12:30 PM Cole Barrett  MRN:  [id number] Subjective:   [name], single, lives alone, employed. Background history of Schizophrenia and SUD. Presented to the ER unaccompanied. Reports distressing auditory hallucinations. Expressed persecutory and paranoid delusion. Scared that his life is in danger. Has not been compliant with medications or follow up. Routine labs are essentially normal.  UDS is negative.  BAL143 mg/dl. Patient has had multiple ER visits this year.  Chart reviewed today. Patient discussed at team today.  Staff reports that he has not had a shower since being here. He has been encouraged to groom self. He comes out to the milieu but reported to be a bit guarded. He has not been observed to be responding to internal stimulation. He has not expressed any violent thoughts. No behavioral issues.   Seen today. Remains very polite and pleasant. Says he is feeling better. Says the voices are less. No side effects from his medication. No persecution. No other delusional theme. No interference with his thought process. Patient in full control of his actions. Encouraged patient to groom self.   Principal Problem: Auditory hallucinations Diagnosis:   Patient Active Problem List   Diagnosis Date Noted  . MDD (major depressive disorder) [F32.9] 09/22/2017  . Alcohol abuse [F10.10]   . Auditory hallucinations [R44.0]   . Alcohol abuse with alcohol-induced mood disorder (HCC) [F10.14] 09/08/2016  . Adjustment disorder with depressed mood [F43.21] 09/07/2016  . Homelessness [Z59.0] 09/03/2016  . Person feigning illness [Z76.5] 09/03/2016  . 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 Cannabis use disorder, mild, abuse [F12.10] 08/29/2016  . Paranoid schizophrenia (HCC) [F20.0] 08/26/2016  . Pulmonary emboli (HCC) [I26.99] 07/13/2016  . Pulmonary embolism and infarction (HCC) [I26.99] 01/02/2014  . Acute left flank pain [R10.9]  01/02/2014  . Pulmonary embolism, bilateral (HCC) [I26.99] 01/02/2014   Total Time spent with patient: 20 minutes  Past Psychiatric History: As in H&P  Past Medical History:  Past Medical History:  Diagnosis Date  . Homelessness   . Hypercholesterolemia   . Mental disorder   . Paranoid behavior (HCC)   . PE (pulmonary embolism)   . Schizophrenia (HCC)    History reviewed. No pertinent surgical history. Family History:  Family History  Problem Relation Age of Onset  . Hypertension Mother    Family Psychiatric  History: As in H&P Social History:  History  Alcohol Use  . Yes    Comment: socially     History  Drug Use  . Types: Marijuana    Comment: Once every 2-3 months, Last used: Last month    Social History   Social History  . Marital status: Single    Spouse name: N/A  . Number of children: N/A  . Years of education: N/A   Social History Main Topics  . Smoking status: Current Every Day Smoker    Packs/day: 0.50    Types: Cigarettes  . Smokeless tobacco: Never Used  . Alcohol use Yes     Comment: socially  . Drug use: Yes    Types: Marijuana     Comment: Once every 2-3 months, Last used: Last month  . Sexual activity: No   Other Topics Concern  . None   Social History Narrative  . None   Additional Social History:        Sleep: Good  Appetite:  Good  Current Medications: Current Facility-Administered Medications  Medication Dose Route Frequency Provider Last Rate Last Dose  .  acetaminophen (TYLENOL) tablet 650 mg  650 mg Oral Q6H PRN Rankin, Shuvon B, NP      . alum & mag hydroxide-simeth (MAALOX/MYLANTA) 200-200-20 MG/5ML suspension 30 mL  30 mL Oral Q4H PRN Rankin, Shuvon B, NP      . benztropine (COGENTIN) tablet 0.5 mg  0.5 mg Oral QHS Izediuno, Vincent A, MD   0.5 mg at 09/24/17 2123  . FLUoxetine (PROZAC) tablet 20 mg  20 mg Oral Daily Rankin, Shuvon B, NP   20 mg at 09/25/17 0800  . magnesium hydroxide (MILK OF MAGNESIA) suspension 30  mL  30 mL Oral Daily PRN Rankin, Shuvon B, NP      . risperiDONE (RISPERDAL) tablet 1 mg  1 mg Oral QHS Izediuno, Delight OvensVincent A, MD   1 mg at 09/24/17 2123    Lab Results: No results found for this or any previous visit (from the past 48 hour(s)).  Blood Alcohol level:  Lab Results  Component Value Date   ETH 143 (H) 09/22/2017   ETH 68 (H) 08/10/2017    Metabolic Disorder Labs: Lab Results  Component Value Date   HGBA1C 5.2 08/27/2016   MPG 103 08/27/2016   Lab Results  Component Value Date   PROLACTIN 34.1 (H) 08/27/2016   Lab Results  Component Value Date   CHOL 160 08/27/2016   TRIG 199 (H) 08/27/2016   HDL 39 (L) 08/27/2016   CHOLHDL 4.1 08/27/2016   VLDL 40 08/27/2016   LDLCALC 81 08/27/2016   LDLCALC 124 (H) 03/13/2014    Physical Findings: AIMS: Facial and Oral Movements Muscles of Facial Expression: None, normal Lips and Perioral Area: None, normal Jaw: None, normal Tongue: None, normal,Extremity Movements Upper (arms, wrists, hands, fingers): None, normal Lower (legs, knees, ankles, toes): None, normal, Trunk Movements Neck, shoulders, hips: None, normal, Overall Severity Severity of abnormal movements (highest score from questions above): None, normal Incapacitation due to abnormal movements: None, normal Patient's awareness of abnormal movements (rate only patient's report): No Awareness, Dental Status Current problems with teeth and/or dentures?: No Does patient usually wear dentures?: No  CIWA:  CIWA-Ar Total: 0 COWS:     Musculoskeletal: Strength & Muscle Tone: within normal limits Gait & Station: normal Patient leans: N/A  Psychiatric Specialty Exam: Physical Exam  Constitutional: He is oriented to person, place, and time. He appears well-developed and well-nourished.  HENT:  Head: Normocephalic and atraumatic.  Respiratory: Effort normal.  Neurological: He is alert and oriented to person, place, and time.  Psychiatric:  As above.     ROS   Blood pressure 125/76, pulse 91, temperature (!) 97.5 F (36.4 C), temperature source Oral, resp. rate 20, height 5\' 10"  (1.778 m), weight 96.2 kg (212 lb), SpO2 100 %.Body mass index is 30.42 kg/m.  General Appearance: Poor personal hygiene, malodorous. Pleasant and engages well. Calm and cooperative. No EPS. No observable internal distraction.   Eye Contact:  Good  Speech:  Clear and Coherent and Normal Rate  Volume:  Normal  Mood:  Euthymic  Affect:  Appropriate and Full Range  Thought Process:  Linear  Orientation:  Full (Time, Place, and Person)  Thought Content:  No preoccupation with violent thoughts. No negative ruminations. No obsession.     Suicidal Thoughts:  No  Homicidal Thoughts:  No  Memory:  Immediate;   Good Recent;   Good Remote;   Good  Judgement:  Fair  Insight:  Fair  Psychomotor Activity:  Normal  Concentration:  Concentration: Good and  Attention Span: Good  Recall:  Good  Fund of Knowledge:  Good  Language:  Good  Akathisia:  Negative  Handed:    AIMS (if indicated):     Assets:  Communication Skills Desire for Improvement Physical Health Resilience  ADL's:  Impaired  Cognition:  WNL  Sleep:  Number of Hours: 3     Treatment Plan Summary: Patient is coming off alcohol well. Alcohol hallucinosis is resolving. No suicidal or homicidal thoughts. We plan to evaluate him further. Hopeful discharge tomorrow  Psychiatric: Psychotic Disorder ,,,, ? Substance induced SUD  Medical: PE  Psychosocial:   PLAN: 1. Continue medications at current dose.  2. Encourage patient to groom self 3. Continue to encourage unit groups and activities 4. Continue to monitor mood, behavior and interaction with peers  Georgiann Cocker, MD 09/25/2017, 12:30 PMPatient ID: [name], male   DOB: 1986-03-12, 31 y.o.   MRN: [id number]

## 2017-09-25 NOTE — Tx Team (Signed)
Interdisciplinary Treatment and Diagnostic Plan Update 09/25/2017 Time of Session: 9:30am  Cole Barrett  MRN: [id number]  Principal Diagnosis: Auditory hallucinations  Secondary Diagnoses: Principal Problem:   Auditory hallucinations Active Problems:   MDD (major depressive disorder)   Current Medications:  Current Facility-Administered Medications  Medication Dose Route Frequency Provider Last Rate Last Dose  . acetaminophen (TYLENOL) tablet 650 mg  650 mg Oral Q6H PRN Rankin, Shuvon B, NP      . alum & mag hydroxide-simeth (MAALOX/MYLANTA) 200-200-20 MG/5ML suspension 30 mL  30 mL Oral Q4H PRN Rankin, Shuvon B, NP      . benztropine (COGENTIN) tablet 0.5 mg  0.5 mg Oral QHS Izediuno, Vincent A, MD   0.5 mg at 09/24/17 2123  . FLUoxetine (PROZAC) tablet 20 mg  20 mg Oral Daily Rankin, Shuvon B, NP   20 mg at 09/25/17 0800  . magnesium hydroxide (MILK OF MAGNESIA) suspension 30 mL  30 mL Oral Daily PRN Rankin, Shuvon B, NP      . risperiDONE (RISPERDAL) tablet 1 mg  1 mg Oral QHS Izediuno, Delight Ovens, MD   1 mg at 09/24/17 2123    PTA Medications: Prescriptions Prior to Admission  Medication Sig Dispense Refill Last Dose  . benztropine (COGENTIN) 1 MG tablet Take 1 tablet (1 mg total) by mouth at bedtime. (Patient not taking: Reported on 09/22/2017) 10 tablet 0 Not Taking at Unknown time  . FLUoxetine (PROZAC) 20 MG tablet Take 1 tablet (20 mg total) by mouth daily. (Patient not taking: Reported on 09/22/2017) 30 tablet 0 Not Taking at Unknown time  . nicotine (NICODERM CQ - DOSED IN MG/24 HOURS) 21 mg/24hr patch Place 1 patch (21 mg total) onto the skin daily. (Patient not taking: Reported on 06/10/2017) 28 patch 0 Not Taking at Unknown time  . risperiDONE (RISPERDAL) 3 MG tablet Take 1 tablet (3 mg total) by mouth at bedtime. (Patient not taking: Reported on 09/22/2017) 10 tablet 0 Not Taking at Unknown time    Treatment Modalities: Medication Management, Group therapy,  Case management,  1 to 1 session with clinician, Psychoeducation, Recreational therapy.  Patient Stressors: Financial difficulties Medication change or noncompliance Other: Homeless Patient Strengths: Geographical information systems officer for treatment/growth Physical Health  Physician Treatment Plan for Primary Diagnosis: Auditory hallucinations Long Term Goal(s): Improvement in symptoms so as ready for discharge Short Term Goals: Ability to identify changes in lifestyle to reduce recurrence of condition will improve Ability to verbalize feelings will improve Ability to disclose and discuss suicidal ideas Ability to demonstrate self-control will improve Ability to identify and develop effective coping behaviors will improve Ability to maintain clinical measurements within normal limits will improve Compliance with prescribed medications will improve Ability to identify triggers associated with substance abuse/mental health issues will improve Ability to identify changes in lifestyle to reduce recurrence of condition will improve Ability to verbalize feelings will improve Ability to disclose and discuss suicidal ideas Ability to demonstrate self-control will improve Ability to identify and develop effective coping behaviors will improve Ability to maintain clinical measurements within normal limits will improve Compliance with prescribed medications will improve Ability to identify triggers associated with substance abuse/mental health issues will improve  Medication Management: Evaluate patient's response, side effects, and tolerance of medication regimen.  Therapeutic Interventions: 1 to 1 sessions, Unit Group sessions and Medication administration.  Evaluation of Outcomes: Progressing  Physician Treatment Plan for Secondary Diagnosis: Principal Problem:   Auditory hallucinations Active Problems:   MDD (major  depressive disorder)  Long Term Goal(s): Improvement in symptoms so  as ready for discharge  Short Term Goals: Ability to identify changes in lifestyle to reduce recurrence of condition will improve Ability to verbalize feelings will improve Ability to disclose and discuss suicidal ideas Ability to demonstrate self-control will improve Ability to identify and develop effective coping behaviors will improve Ability to maintain clinical measurements within normal limits will improve Compliance with prescribed medications will improve Ability to identify triggers associated with substance abuse/mental health issues will improve Ability to identify changes in lifestyle to reduce recurrence of condition will improve Ability to verbalize feelings will improve Ability to disclose and discuss suicidal ideas Ability to demonstrate self-control will improve Ability to identify and develop effective coping behaviors will improve Ability to maintain clinical measurements within normal limits will improve Compliance with prescribed medications will improve Ability to identify triggers associated with substance abuse/mental health issues will improve  Medication Management: Evaluate patient's response, side effects, and tolerance of medication regimen.  Therapeutic Interventions: 1 to 1 sessions, Unit Group sessions and Medication administration.  Evaluation of Outcomes: Progressing  RN Treatment Plan for Primary Diagnosis: Auditory hallucinations Long Term Goal(s): Knowledge of disease and therapeutic regimen to maintain health will improve  Short Term Goals: Compliance with prescribed medications will improve  Medication Management: RN will administer medications as ordered by provider, will assess and evaluate patient's response and provide education to patient for prescribed medication. RN will report any adverse and/or side effects to prescribing provider.  Therapeutic Interventions: 1 on 1 counseling sessions, Psychoeducation, Medication administration,  Evaluate responses to treatment, Monitor vital signs and CBGs as ordered, Perform/monitor CIWA, COWS, AIMS and Fall Risk screenings as ordered, Perform wound care treatments as ordered.  Evaluation of Outcomes: Progressing  LCSW Treatment Plan for Primary Diagnosis: Auditory hallucinations Long Term Goal(s): Safe transition to appropriate next level of care at discharge, Engage patient in therapeutic group addressing interpersonal concerns. Short Term Goals: Engage patient in aftercare planning with referrals and resources, Increase ability to appropriately verbalize feelings, Facilitate patient progression through stages of change regarding substance use diagnoses and concerns, Identify triggers associated with mental health/substance abuse issues and Increase skills for wellness and recovery  Therapeutic Interventions: Assess for all discharge needs, 1 to 1 time with Social worker, Explore available resources and support systems, Assess for adequacy in community support network, Educate family and significant other(s) on suicide prevention, Complete Psychosocial Assessment, Interpersonal group therapy.  Evaluation of Outcomes: Progressing  Progress in Treatment: Attending groups: Intermittently  Participating in groups: Minimally, when pt attends   Taking medication as prescribed: Yes, MD continues to assess for medication changes as needed Toleration medication: Yes, no side effects reported at this time Family/Significant other contact made: No, pt declined contact. Patient understands diagnosis: Yes Discussing patient identified problems/goals with staff: Yes Medical problems stabilized or resolved: Yes Denies suicidal/homicidal ideation:  Issues/concerns per patient self-inventory: None Other: N/A  New problem(s) identified: None identified at this time.   New Short Term/Long Term Goal(s): None identified at this time.   Discharge Plan or Barriers: Pt will return home and follow up  with an outpatient provider.  Reason for Continuation of Hospitalization:  Anxiety  Depression Medication stabilization Suicidal ideation Withdrawal symptoms  Estimated Length of Stay: 1-3 days; Estimated discharge date 09/28/17  Attendees: Patient: 09/25/2017 4:12 PM  Physician: Dr. Jackquline Berlin 09/25/2017 4:12 PM  Nursing: Meriam Sprague RN; [place], RN 09/25/2017 4:12 PM  RN Care Manager: Onnie Boer, RN 09/25/2017 4:12  PM  Social Worker: Donnelly StagerLynn Daiki Dicostanzo, Theresia MajorsLCSWA 09/25/2017 4:12 PM  Recreational Therapist:  09/25/2017 4:12 PM  Other: Armandina StammerAgnes Nwoko, NP 09/25/2017 4:12 PM  Other:  09/25/2017 4:12 PM  Other: 09/25/2017 4:12 PM  Scribe for Treatment Team: Jonathon JordanLynn B Dontavis Tschantz, MSW,LCSWA 09/25/2017 4:12 PM

## 2017-09-25 NOTE — [facility] Group Notes (Signed)
[facility] Group Therapy 09/25/2017 1:15pm  Type of Therapy: Mental [employer] Presentation  Participation Level: Active  Participation Quality: Attentive  Affect: Appropriate  Cognitive: Oriented  Insight: Developing/Improving  Engagement in Therapy: Engaged  Modes of Intervention: Discussion, Education and Socialization  Summary of Progress/Problems: Mental Health Association (MHA) Speaker came to talk about his personal journey with living with a mental health diagnosis. The pt processed ways by which to relate to the speaker. MHA speaker provided handouts and educational information pertaining to groups and services offered by the MHA. Pt was engaged in speaker's presentation and was receptive to resources provided.    Cole Barrett B Konnar Ben, MSW, LCSWA 09/25/2017 2:53 PM   

## 2017-09-25 NOTE — Progress Notes (Signed)
D: Patient observed isolative to room, bed. On 1:1 with this writer patient is guarded and forwards minimal information. Patient verbalizes to this Clinical research associatewriter he continues to have AH saying "they are going to kill me. They are lessening though." Patient's affect anxious, somewhat suspicious, mood congruent. Hygiene remains quote poor with noted body odor. Patient will not shower or wash clothes. Denies pain, physical problems.   A: Medicated per orders, no prns requested or required. Level III obs in place for safety. Emotional support offered. Encouraged completion of  self inventory, Suicide Safety Plan as well as programming participation. Discussed POC with MD, SW. Notified MD that patient's K+ was 3.3 on admit and to date, has not been rechecked.  R: Patient verbalizes understanding of POC. Patient denies SI/HI/VH and remains safe on level III obs. Will continue to monitor closely and make verbal contact frequently.

## 2017-09-26 DIAGNOSIS — Z765 Malingerer [conscious simulation]: Secondary | ICD-10-CM

## 2017-09-26 DIAGNOSIS — F4321 Adjustment disorder with depressed mood: Secondary | ICD-10-CM

## 2017-09-26 DIAGNOSIS — Z590 Homelessness: Secondary | ICD-10-CM

## 2017-09-26 DIAGNOSIS — F1210 Cannabis abuse, uncomplicated: Secondary | ICD-10-CM

## 2017-09-26 MED ORDER — RISPERIDONE 1 MG PO TABS: 1.0000 mg | ORAL_TABLET | Freq: Every day | ORAL | 0 refills | Status: DC

## 2017-09-26 MED ORDER — FLUOXETINE HCL 20 MG PO TABS: 20.0000 mg | ORAL_TABLET | Freq: Every day | ORAL | 0 refills | Status: DC

## 2017-09-26 MED ORDER — FLUOXETINE HCL 20 MG PO CAPS
20.0000 mg | ORAL_CAPSULE | Freq: Every day | ORAL | Status: DC
  Filled 2017-09-26: qty 7

## 2017-09-26 MED ORDER — BENZTROPINE MESYLATE 0.5 MG PO TABS: 0.5000 mg | ORAL_TABLET | Freq: Every day | ORAL | 0 refills | Status: DC

## 2017-09-26 NOTE — Progress Notes (Signed)
D: Patient cooperative with care this shift, but appears to be paranoid of staff and peers. Pt with little interaction. A: Q 15 minute safety checks, encourage staff/peer interaction and group participation. Administer medications as ordered.R: Pt compliant with medications and attended group session. Pt denies SI at this time and verbally contracts for safety. No signs/symptoms of distress noted.

## 2017-09-26 NOTE — Discharge Summary (Signed)
Physician Discharge Summary Note  Patient:  Cole Barrett is an 31 y.o., male MRN:  [id number] DOB:  [birth year] Patient phone:  [phone] (home)  Patient address:   [address] [place] [address],  Total Time spent with patient: 20 minutes  Date of Admission:  09/22/2017 Date of Discharge: 09/26/17   Reason for Admission:  Psychosis  Principal Problem: Auditory hallucinations Discharge Diagnoses: Patient Active Problem List   Diagnosis Date Noted  . MDD (major depressive disorder) [F32.9] 09/22/2017  . Alcohol abuse [F10.10]   . Auditory hallucinations [R44.0]   . Alcohol abuse with alcohol-induced mood disorder (HCC) [F10.14] 09/08/2016  . Adjustment disorder with depressed mood [F43.21] 09/07/2016  . Homelessness [Z59.0] 09/03/2016  . Person feigning illness [Z76.5] 09/03/2016  . 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 Cannabis use disorder, mild, abuse [F12.10] 08/29/2016  . Paranoid schizophrenia (HCC) [F20.0] 08/26/2016  . Pulmonary emboli (HCC) [I26.99] 07/13/2016  . Pulmonary embolism and infarction (HCC) [I26.99] 01/02/2014  . Acute left flank pain [R10.9] 01/02/2014  . Pulmonary embolism, bilateral (HCC) [I26.99] 01/02/2014    Past Psychiatric History: SUD and psychosis. Linked to [employer]. Does not follow up with appointments. Has not been adherent with recommended treatment. No past suicidal behavior. No past history of violent behavior  Past Medical History:  Past Medical History:  Diagnosis Date  . Homelessness   . Hypercholesterolemia   . Mental disorder   . Paranoid behavior (HCC)   . PE (pulmonary embolism)   . Schizophrenia (HCC)    History reviewed. No pertinent surgical history. Family History:  Family History  Problem Relation Age of Onset  . Hypertension Mother    Family Psychiatric  History: Denies Social History:  History  Alcohol Use  . Yes    Comment: socially     History  Drug Use  . Types: Marijuana     Comment: Once every 2-3 months, Last used: Last month    Social History   Social History  . Marital status: Single    Spouse name: N/A  . Number of children: N/A  . Years of education: N/A   Social History Main Topics  . Smoking status: Current Every Day Smoker    Packs/day: 0.50    Types: Cigarettes  . Smokeless tobacco: Never Used  . Alcohol use Yes     Comment: socially  . Drug use: Yes    Types: Marijuana     Comment: Once every 2-3 months, Last used: Last month  . Sexual activity: No   Other Topics Concern  . None   Social History Narrative  . None    Hospital Course:   09/23/17 MD Assessment: [name], single, lives alone, employed. Background history of Schizophrenia and SUD. Presented to the ER unaccompanied. Reports distressing auditory hallucinations. Expressed persecutory and paranoid delusion. Scared that his life is in danger. Has not been compliant with medications or follow up. Routine labs are essentially normal.  UDS is negative.  BAL143 mg/dl. Patient has had multiple ER visits this year. At interview, patient is very vague about the voices. Not sure how many they are. Says he has been hearing hem for the past two years. Says they got intense on Friday. No new stressors. Says he has been functioning at work and at home. Says he works for [employer]. Says he was at work last Friday. No effects on his sleep or eating pattern. No behavioral changes recently.  Patient repeatedly stated that he just  wants to be in a safe place so that the voices can get better. Says it has gotten less since he has been here. Says he was given Risperidone in the past and it helped. Would like to restart Risperidone. No recent substance use. Says he typically uses natural THC occassionally. Patient is not voicing any paranoia. He just got out of groups. Says he is at peace here. No suicidal thoughts. No homicidal thoughts. No thoughts of violence. No acces to weapons.   [facility]  [facility] and stabilized with medication and therapy. Patient was restarted on Cogentin 0.5 mg QHS and Risperidone 1 mg QHS. Patient was also started on Prozac 20 mg Daily for stabilization. Patient showed improvement with improved sleep, mood, affect, appetite, and interaction. Patient denied any withdrawal symptoms and denied any SI/HI/AVH. He has been seen in the day room interacting appropriately with peers and staff. He has been attending group and participating. He is provided prescriptions and samples upon discharge. Patient continues to deny ant SI/HI/AVH. He agrees to [facility] and be compliant with medications and treatments.    Physical Findings: AIMS: Facial and Oral Movements Muscles of Facial Expression: None, normal Lips and Perioral Area: None, normal Jaw: None, normal Tongue: None, normal,Extremity Movements Upper (arms, wrists, hands, fingers): None, normal Lower (legs, knees, ankles, toes): None, normal, Trunk Movements Neck, shoulders, hips: None, normal, Overall Severity Severity of abnormal movements (highest score from questions above): None, normal Incapacitation due to abnormal movements: None, normal Patient's awareness of abnormal movements (rate only patient's report): No Awareness, Dental Status Current problems with teeth and/or dentures?: No Does patient usually wear dentures?: No  CIWA:  CIWA-Ar Total: 0 COWS:     Musculoskeletal: Strength & Muscle Tone: within normal limits Gait & Station: normal Patient leans: N/A  Psychiatric Specialty Exam: Physical Exam  Nursing note and vitals reviewed. Constitutional: He is oriented to person, place, and time. He appears well-developed and well-nourished.  Cardiovascular: Normal rate.   Respiratory: Effort normal.  Musculoskeletal: Normal range of motion.  Neurological: He is alert and oriented to person, place, and time.  Skin: Skin is warm.    Review  of Systems  Constitutional: Negative.   HENT: Negative.   Eyes: Negative.   Respiratory: Negative.   Cardiovascular: Negative.   Gastrointestinal: Negative.   Genitourinary: Negative.   Musculoskeletal: Negative.   Skin: Negative.   Neurological: Negative.   Endo/Heme/Allergies: Negative.   Psychiatric/Behavioral: Negative.     Blood pressure 127/77, pulse 69, temperature 98.4 F (36.9 C), resp. rate 12, height 5\' 10"  (1.778 m), weight 96.2 kg (212 lb), SpO2 100 %.Body mass index is 30.42 kg/m.  General Appearance: Casual  Eye Contact:  Good  Speech:  Clear and Coherent and Normal Rate  Volume:  Normal  Mood:  Euthymic  Affect:  Appropriate  Thought Process:  Goal Directed and Descriptions of Associations: Intact  Orientation:  Full (Time, Place, and Person)  Thought Content:  WDL  Suicidal Thoughts:  No  Homicidal Thoughts:  No  Memory:  Immediate;   Good Recent;   Good Remote;   Good  Judgement:  Good  Insight:  Good  Psychomotor Activity:  Normal  Concentration:  Concentration: Good and Attention Span: Good  Recall:  Good  Fund of Knowledge:  Good  Language:  Good  Akathisia:  No  Handed:  Right  AIMS (if indicated):     Assets:  Communication Skills  Desire for Improvement Financial Resources/Insurance Physical Health Social Support Transportation  ADL's:  Intact  Cognition:  WNL  Sleep:  Number of Hours: 6     Have you used any form of tobacco in the last 30 days? (Cigarettes, Smokeless Tobacco, Cigars, and/or Pipes): Yes  Has this patient used any form of tobacco in the last 30 days? (Cigarettes, Smokeless Tobacco, Cigars, and/or Pipes) Yes, Yes, A prescription for an FDA-approved tobacco cessation medication was offered at discharge and the patient refused  Blood Alcohol level:  Lab Results  Component Value Date   ETH 143 (H) 09/22/2017   ETH 68 (H) 08/10/2017    Metabolic Disorder Labs:  Lab Results  Component Value Date   HGBA1C 5.2 08/27/2016    MPG 103 08/27/2016   Lab Results  Component Value Date   PROLACTIN 34.1 (H) 08/27/2016   Lab Results  Component Value Date   CHOL 160 08/27/2016   TRIG 199 (H) 08/27/2016   HDL 39 (L) 08/27/2016   CHOLHDL 4.1 08/27/2016   VLDL 40 08/27/2016   LDLCALC 81 08/27/2016   LDLCALC 124 (H) 03/13/2014    See Psychiatric Specialty Exam and Suicide Risk Assessment completed by Attending Physician prior to discharge.  Discharge destination:  Home  Is patient on multiple antipsychotic therapies at discharge:  No   Has Patient had three or more failed trials of antipsychotic monotherapy by history:  No  Recommended Plan for Multiple Antipsychotic Therapies: NA   Allergies as of 09/26/2017      Reactions   Haldol [haloperidol] Shortness Of Breath      Medication List    STOP taking these medications   nicotine 21 mg/24hr patch Commonly known as:  NICODERM CQ - dosed in mg/24 hours     TAKE these medications     Indication  benztropine 0.5 MG tablet Commonly known as:  COGENTIN Take 1 tablet (0.5 mg total) by mouth at bedtime. For EPS What changed:  medication strength  how much to take  additional instructions  Indication:  Extrapyramidal Reaction caused by Medications   FLUoxetine 20 MG tablet Commonly known as:  PROZAC Take 1 tablet (20 mg total) by mouth daily. For mood control What changed:  additional instructions  Indication:  Depression   risperiDONE 1 MG tablet Commonly known as:  RISPERDAL Take 1 tablet (1 mg total) by mouth at bedtime. For mood control What changed:  medication strength  how much to take  additional instructions  Indication:  mood stabilization      Follow-up Information    [facility] Follow up.   Why:  Please go as a walk-in within 1-3 days of discharge to be established for outpatient services. Walk-in hours are Mon-Fri 8am-3pm. Please arrive as early as possible to be sure that you are seen. Thank you. Contact  information: [address] [place] [address] [phone]           Follow-up recommendations:  Continue activity as tolerated. Continue diet as recommended by your PCP. Ensure to keep all appointments with outpatient providers.  Comments:  Patient is instructed prior to discharge to: Take all medications as prescribed by his/her mental healthcare provider. Report any adverse effects and or reactions from the medicines to his/her outpatient provider promptly. Patient has been instructed & cautioned: To not engage in alcohol and or illegal drug use while on prescription medicines. In the event of worsening symptoms, patient is instructed to call the crisis hotline, 911 and or go  to the nearest ED for appropriate evaluation and treatment of symptoms. To follow-up with his/her primary care provider for your other medical issues, concerns and or health care needs.    Signed: Maryfrances Bunnell, FNP 09/26/2017, 12:37 PM   Patient seen, Suicide Assessment Completed.  Disposition Plan Reviewed

## 2017-09-26 NOTE — Plan of Care (Signed)
Problem: Coping: Goal: Ability to demonstrate self-control will improve Outcome: Completed/Met Date Met: 09/26/17 Patient, while paranoid, has not been a behavioral concern on the unit. No acting out, periods of aggression.  Problem: Physical Regulation: Goal: Ability to maintain clinical measurements within normal limits will improve Outcome: Completed/Met Date Met: 09/26/17 VSS, no medical complications

## 2017-09-26 NOTE — Progress Notes (Signed)
  [facility] Discharge [facility] :  Will you be returning to the same living situation after discharge:  Yes,  pt returning home. At discharge, do you have transportation home?: Yes,  pt has access to transportation. Do you have the ability to pay for your medications: Yes,  prescriptions and samples provided.  Release of information consent forms completed and in the chart;  Patient's signature needed at discharge.  Patient to Follow up at: Follow-up Information    [facility] Follow up.   Why:  Please go as a walk-in within 1-3 days of discharge to be established for outpatient services. Walk-in hours are Mon-Fri 8am-3pm. Please arrive as early as possible to be sure that you are seen. Thank you. Contact information: [address] [place] [address] [phone]           Next level of care provider [facility] Link:no  Safety Planning and Suicide Prevention discussed: Yes,  with pt.  Have you used any form of tobacco in the last 30 days? (Cigarettes, Smokeless Tobacco, Cigars, and/or Pipes): Yes  Has patient been referred to the Quitline?: Patient refused referral  Patient has been referred for addiction treatment: Yes  Jonathon JordanLynn B Tiffani Kadow, MSW, LCSWA 09/26/2017, 11:12 AM

## 2017-09-26 NOTE — Progress Notes (Signed)
Recreation Therapy Notes  Animal-Assisted Activity (AAA) Program Checklist/Progress Notes Patient Eligibility Criteria Checklist & Daily Group note for Rec TxIntervention  Date: 10.30.2018 Time: 2:45pm [address]   AAA/T Program Assumption of Risk Form signed by Patient/ or Parent Legal Guardian Yes  Patient is free of allergies or sever asthma Yes  Patient reports no fear of animals Yes  Patient reports no history of cruelty to animals Yes  Patient understands his/her participation is voluntary Yes  Behavioral Response: Did not attend.   Marykay Lexenise L Jaquel Glassburn, LRT/CTRS        Esai Stecklein L 09/26/2017 3:00 PM

## 2017-09-26 NOTE — Progress Notes (Signed)
D: Patient observed sitting in dayroom however keeps to self. Guarded, cautious with this Clinical research associatewriter during 1:1. Patient verbalizes to this Clinical research associatewriter he is "okay." Minimal information offered, even with supportive prompting. Patient's affect flat, mood guarded, somewhat suspicious. States AH still present but lessening. They continue to make statements that they are going to harm him. Patient verbalizes awareness that AH are part of his illness.  Denies pain, physical problems.   A: Medicated per orders, no prns requested or required. Level III obs in place for safety. Emotional support offered and self inventory reviewed. Encouraged completion of Suicide Safety Plan and programming participation. Discussed POC with MD, SW.   R: Patient verbalizes understanding of POC. Patient denies SI/HI/VH and remains safe on level III obs. Will continue to monitor closely and make verbal contact frequently. Identified as a possible discharge for today. MD to eval readiness.

## 2017-09-26 NOTE — [facility] Group Notes (Signed)
Pt attended spiritual care group on grief and loss facilitated by chaplain Rider Ermis   Group opened with brief discussion and psycho-social ed around grief and loss in relationships and in relation to self - identifying life patterns, circumstances, changes that cause losses. Established group norm of speaking from own life experience. Group goal of establishing open and affirming space for members to share loss and experience with grief, normalize grief experience and provide psycho social education and grief support.  Group engaged in facilitated discussion around experience of grief, discussed tasks of mourning with acknowledgement of where these fit into their own journey.   Group facilitation drew on Pastoral care, Adlerian, Narrative, and psycho-dynamic group theories.   

## 2017-09-26 NOTE — [facility] Group Notes (Signed)
Adult Psychoeducational Group Note  Date:  09/26/2017 Time:  12:28 AM  Group Topic/Focus:  Wrap-Up Group:   The focus of this group is to help patients review their daily goal of treatment and discuss progress on daily workbooks.  Participation Level:  Active  Participation Quality:  Appropriate and Attentive  Affect:  Appropriate and Excited  Cognitive:  Alert and Appropriate  Insight: Appropriate  Engagement in Group:  Engaged  Modes of Intervention:  Discussion  Additional Comments:  Pt attended and participate din group. Pt rated their day an 8/10. Pt goal was to relax and have peace and quiet. One positive for the pt was that they ate good and that they were being cool for the day.   Chrisandra NettersOctavia A Jaydee Ingman 09/26/2017, 12:28 AM

## 2017-09-26 NOTE — [facility] Suicide Risk Assessment (Addendum)
[facility]   Principal Problem: Auditory hallucinations Discharge Diagnoses:  Patient Active Problem List   Diagnosis Date Noted  . MDD (major depressive disorder) [F32.9] 09/22/2017  . Alcohol abuse [F10.10]   . Auditory hallucinations [R44.0]   . Alcohol abuse with alcohol-induced mood disorder (HCC) [F10.14] 09/08/2016  . Adjustment disorder with depressed mood [F43.21] 09/07/2016  . Homelessness [Z59.0] 09/03/2016  . Person feigning illness [Z76.5] 09/03/2016  . 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 Cannabis use disorder, mild, abuse [F12.10] 08/29/2016  . Paranoid schizophrenia (HCC) [F20.0] 08/26/2016  . Pulmonary emboli (HCC) [I26.99] 07/13/2016  . Pulmonary embolism and infarction (HCC) [I26.99] 01/02/2014  . Acute left flank pain [R10.9] 01/02/2014  . Pulmonary embolism, bilateral (HCC) [I26.99] 01/02/2014    Total Time spent with patient: 30 minutes  Musculoskeletal: Strength & Muscle Tone: within normal limits Gait & Station: normal Patient leans: N/A  Psychiatric Specialty Exam: ROS no headache, no chest pain, no shortness of breath, no vomiting, no rash   Blood pressure 127/77, pulse 69, temperature 98.4 F (36.9 C), resp. rate 12, height 5\' 10"  (1.778 m), weight 96.2 kg (212 lb), SpO2 100 %.Body mass index is 30.42 kg/m.  General Appearance: Fairly Groomed  Patent attorneyye Contact::  Good  Speech:  Normal Rate409  Volume:  Normal  Mood:  euthymic,denies depression, states feeling " good today"  Affect:  Appropriate and Full Range  Thought Process:  Linear and Descriptions of Associations: Intact  Orientation:  Other:  fully alert and attentive   Thought Content:  reports auditory hallucinations are now much improved , states that they are now faint and do not bother him, does not appear internally preoccupied   Suicidal Thoughts:  No denies any suicidal or self injurious ideations   Homicidal Thoughts:  No denies violent or  homicidal ideations   Memory:  recent and remote grossly intact   Judgement:  Other:  improving   Insight:  improving   Psychomotor Activity:  Normal  Concentration:  Good  Recall:  Good  Fund of Knowledge:Good  Language: Good  Akathisia:  Negative  Handed:  Right  AIMS (if indicated):   no abnormal or involuntary movements noted or reported   Assets:  Communication Skills Desire for Improvement Resilience  Sleep:  Number of Hours: 6  Cognition: WNL  ADL's:  Intact   Mental Status Per Nursing Assessment::   On Admission:  NA  Demographic Factors:  31 year old single male, no children, employed   Loss Factors: Psychiatric symptoms had worsened, but does not endorse any specific stressors   Historical Factors: One prior psychiatric admission 2016, denies history of severe depression, denies history of suicide attempts, history of psychotic symptoms.   Risk Reduction Factors:   Employed and Positive coping skills or problem solving skills  Continued Clinical Symptoms:  Patient reports improvement, at this time presents alert, attentive pleasant on approach, no thought disorder, no suicidal or self injurious ideations, no homicidal ideations , reports much improvement of hallucinations, no delusions are expressed, does not appear internally preoccupied. Future oriented, plans to return home and to return to work later this week. Denies medication side effects- we reviewed side effects, discussed potentially increasing Risperidone to address lingering symptoms. Patient prefers to continue current dose, states it is effective .  Cognitive Features That Contribute To Risk:  No gross cognitive deficits noted upon discharge. Is alert , attentive, and oriented x 3   Suicide Risk:  Mild:  Suicidal  ideation of limited frequency, intensity, duration, and specificity.  There are no identifiable plans, no associated intent, mild dysphoria and related symptoms, good self-control (both  objective and subjective assessment), few other risk factors, and identifiable protective factors, including available and accessible social support.  Follow-up Information    [facility] Follow up.   Why:  Walk-In Clinic is open 8am to 3pm Monday through Friday. Contact information: [address] [place] [address] [phone]           Plan Of Care/Follow-up recommendations:  Activity:  as tolerated  Diet:  Regular Tests:  NA' Other:  see below  Patient is requesting discharge and expressing readiness for discharge , no grounds for involuntary commitment at this time. He is leaving unit in good spirits. Plans to follow up as above   Craige Cotta, MD 09/26/2017, 10:54 AM

## 2017-09-26 NOTE — Progress Notes (Signed)
Patient verbalizes readiness for discharge. Follow up plan explained, AVS, transition record and SRA given along with prescriptions. Sample meds provided. All belongings returned. Patient verbalizes understanding. Denies SI/HI and assures this Clinical research associatewriter he will seek assistance should that change. Patient discharged ambulatory and in stable condition with bus passes.

## 2017-11-10 ENCOUNTER — Emergency Department (HOSPITAL_COMMUNITY)
Admission: EM | Admit: 2017-11-10 | Discharge: 2017-11-11 | Disposition: A | Discharge status: Home / Self Care | Payer: Self-pay | Attending: Emergency Medicine | Admitting: Emergency Medicine

## 2017-11-10 ENCOUNTER — Encounter (HOSPITAL_COMMUNITY): Payer: Self-pay | Admitting: *Deleted

## 2017-11-10 DIAGNOSIS — R440 Auditory hallucinations: Secondary | ICD-10-CM | POA: Insufficient documentation

## 2017-11-10 DIAGNOSIS — F4321 Adjustment disorder with depressed mood: Secondary | ICD-10-CM | POA: Diagnosis present

## 2017-11-10 DIAGNOSIS — F329 Major depressive disorder, single episode, unspecified: Secondary | ICD-10-CM | POA: Insufficient documentation

## 2017-11-10 DIAGNOSIS — F17210 Nicotine dependence, cigarettes, uncomplicated: Secondary | ICD-10-CM | POA: Insufficient documentation

## 2017-11-10 DIAGNOSIS — F209 Schizophrenia, unspecified: Secondary | ICD-10-CM | POA: Insufficient documentation

## 2017-11-10 LAB — SALICYLATE LEVEL

## 2017-11-10 LAB — COMPREHENSIVE METABOLIC PANEL
ALK PHOS: 46 U/L (ref 38–126)
ALT: 85 U/L — AB (ref 17–63)
AST: 60 U/L — AB (ref 15–41)
Albumin: 4 g/dL (ref 3.5–5.0)
Anion gap: 12 (ref 5–15)
BILIRUBIN TOTAL: 0.8 mg/dL (ref 0.3–1.2)
BUN: 15 mg/dL (ref 6–20)
CALCIUM: 8.9 mg/dL (ref 8.9–10.3)
CHLORIDE: 103 mmol/L (ref 101–111)
CO2: 24 mmol/L (ref 22–32)
CREATININE: 1 mg/dL (ref 0.61–1.24)
Glucose, Bld: 116 mg/dL — ABNORMAL HIGH (ref 65–99)
Potassium: 3.8 mmol/L (ref 3.5–5.1)
Sodium: 139 mmol/L (ref 135–145)
TOTAL PROTEIN: 7.1 g/dL (ref 6.5–8.1)

## 2017-11-10 LAB — RAPID URINE DRUG SCREEN, HOSP PERFORMED
AMPHETAMINES: NOT DETECTED
Barbiturates: NOT DETECTED
Benzodiazepines: NOT DETECTED
Cocaine: NOT DETECTED
OPIATES: NOT DETECTED
Tetrahydrocannabinol: NOT DETECTED

## 2017-11-10 LAB — ETHANOL

## 2017-11-10 LAB — CBC
HCT: 39 % (ref 39.0–52.0)
Hemoglobin: 13 g/dL (ref 13.0–17.0)
MCH: 28.5 pg (ref 26.0–34.0)
MCHC: 33.3 g/dL (ref 30.0–36.0)
MCV: 85.5 fL (ref 78.0–100.0)
PLATELETS: 307 10*3/uL (ref 150–400)
RBC: 4.56 MIL/uL (ref 4.22–5.81)
RDW: 13.5 % (ref 11.5–15.5)
WBC: 6.1 10*3/uL (ref 4.0–10.5)

## 2017-11-10 LAB — ACETAMINOPHEN LEVEL: Acetaminophen (Tylenol), Serum: <10 ug/mL — ABNORMAL LOW (ref 10–30)

## 2017-11-10 MED ORDER — FLUOXETINE HCL 20 MG PO CAPS
20.0000 mg | ORAL_CAPSULE | Freq: Every day | ORAL | Status: DC
  Administered 2017-11-11: 20 mg via ORAL
  Filled 2017-11-10: qty 1

## 2017-11-10 MED ORDER — ACETAMINOPHEN 325 MG PO TABS: 650.0000 mg | ORAL_TABLET | Freq: Four times a day (QID) | ORAL | Status: DC | PRN

## 2017-11-10 MED ORDER — BENZTROPINE MESYLATE 0.5 MG PO TABS
0.5000 mg | ORAL_TABLET | Freq: Every day | ORAL | Status: DC
  Administered 2017-11-11: 0.5 mg via ORAL
  Filled 2017-11-10: qty 1

## 2017-11-10 MED ORDER — RISPERIDONE 1 MG PO TABS
1.0000 mg | ORAL_TABLET | Freq: Every day | ORAL | Status: DC
  Administered 2017-11-11: 1 mg via ORAL
  Filled 2017-11-10: qty 1

## 2017-11-10 NOTE — BH Assessment (Addendum)
Assessment Note  Cole Barrett is an 31 y.o. male,  voluntary and unaccompanied to [facility]. This is the pt's fourteenth psychiatric ED visit this year, for a similar presentation. Clinician asked the pt, "what brought you to the hospital?" Pt replied, "doing bad, hearing voices out loud." Pt reported, he has been hearing voices for a couple of years however earlier this afternoon the voice became loud. Pt reported the voice said, "they were going to kill me." Pt reported, he then came to the hospital to seek treatment. Pt reported, he then came to the hospital to seek treatment. Pt reported, he is currently hearing the voice threatening him. Pt denies, SI, HI, self-injurious behaviors and access to weapons.   Pt denies abuse. Pt reported, smoking one cigarette, daily. Pt denies, being linked to OPT resources (medication management and/or counseling.) Pt reported, he was precribed medication while at [facility] however he ran out a couple of weeks ago. Pt reported, previous inpatient admissions to [facility].   Pt presents with body order in scrubs with logical/coherent speech. Pt's eye contact was poor. Pt's mood was helpless. Pt's affect was flat. Pt's thought process was coherent/relevant. Pt's judgement was unimpaired. Pt's concentration, insight and impulse control are fair. Pt was oriented x4. Pt reported, if discharged from [facility] he could contract for safety. Pt reported, if inpatient treatment is recommended he would sign-in voluntarily. Clinician explained the three possible dispositions (discharge, observation and inpatient treatment) in detail.   Diagnosis: F20.9 Schizophrenia.  Past Medical History:  Past Medical History:  Diagnosis Date  . Homelessness   . Hypercholesterolemia   . Mental disorder   . Paranoid behavior (HCC)   . PE (pulmonary embolism)   . Schizophrenia (HCC)     History reviewed. No pertinent surgical history.  Family History:  Family History  Problem Relation  Age of Onset  . Hypertension Mother     Social History:  reports that he has been smoking cigarettes.  He has been smoking about 0.50 packs per day. he has never used smokeless tobacco. He reports that he drinks alcohol. He reports that he uses drugs. Drug: Marijuana.  Additional Social History:  Alcohol / Drug Use Pain Medications: See MAR Prescriptions: Per chart: "STS NO RX MEDS CURRENTLY."  Over the Counter: See MAR History of alcohol / drug use?: Yes Substance #1 Name of Substance 1: Cigarettes. 1 - Age of First Use: UTA 1 - Amount (size/oz): Pt reported, smoking one cigarette a day.  1 - Frequency: UTA 1 - Duration: Ongoing.  1 - Last Use / Amount: Pt reported, daily.   CIWA: CIWA-Ar BP: (!) 163/93 Pulse Rate: (!) 105 COWS:    Allergies:  Allergies  Allergen Reactions  . Haldol [Haloperidol] Shortness Of Breath    Home Medications:  (Not in a hospital admission)  OB/GYN Status:  No LMP for male patient.  General Assessment Data Location of Assessment: [facility] ED TTS Assessment: In system Is this a Tele or Face-to-Face Assessment?: Face-to-Face Is this an Initial Assessment or a Re-assessment for this encounter?: Initial Assessment Marital status: Single Is patient pregnant?: No Pregnancy Status: No Living Arrangements: Alone Can pt return to current living arrangement?: Yes Admission Status: Voluntary Is patient capable of signing voluntary admission?: Yes Referral Source: Self/Family/Friend Insurance type: Self-pay.     Crisis Care Plan Living Arrangements: Alone Legal Guardian: Other:(Self) Name of Psychiatrist: NA Name of Therapist: NA  Education Status Is patient currently in school?: No Current Grade: NA Highest  grade of school patient has completed: 12th grade. Name of school: NA Contact person: NA  Risk to self with the past 6 months Suicidal Ideation: No(Pt denies. ) Has patient been a risk to self within the past 6 months prior to  admission? : No Suicidal Intent: No Has patient had any suicidal intent within the past 6 months prior to admission? : No Is patient at risk for suicide?: No Suicidal Plan?: No Has patient had any suicidal plan within the past 6 months prior to admission? : No Access to Means: No What has been your use of drugs/alcohol within the last 12 months?: Cigarettes.  Previous Attempts/Gestures: No How many times?: 0 Other Self Harm Risks: NA Triggers for Past Attempts: None known Intentional Self Injurious Behavior: None(Pt denies.) Family Suicide History: No Recent stressful life event(s): Other (Comment)(Pt hearing voice telling him to kill himself. ) Persecutory voices/beliefs?: Yes Depression: No(Pt denies. ) Depression Symptoms: (Pt denies. ) Substance abuse history and/or treatment for substance abuse?: No Suicide prevention information given to non-admitted patients: Not applicable  Risk to Others within the past 6 months Homicidal Ideation: No(Pt denies.) Does patient have any lifetime risk of violence toward others beyond the six months prior to admission? : No(Pt denies. ) Thoughts of Harm to Others: No Current Homicidal Intent: No Current Homicidal Plan: No Access to Homicidal Means: No Identified Victim: NA History of harm to others?: No Assessment of Violence: None Noted Violent Behavior Description: NA Does patient have access to weapons?: No(Pt denies. ) Criminal Charges Pending?: No Does patient have a court date: No Is patient on probation?: No  Psychosis Hallucinations: Auditory Delusions: None noted  Mental Status Report Appearance/Hygiene: In scrubs, Body odor Eye Contact: Poor Motor Activity: Unremarkable Speech: Logical/coherent Level of Consciousness: Quiet/awake Mood: Helpless Affect: Flat Anxiety Level: None Thought Processes: Relevant, Coherent Judgement: Unimpaired Orientation: Person, Place, Time, Situation Obsessive Compulsive  Thoughts/Behaviors: None  Cognitive Functioning Concentration: Fair Memory: Recent Intact IQ: Average Insight: Fair Impulse Control: Fair Appetite: Poor Sleep: No Change Total Hours of Sleep: 8 Vegetative Symptoms: None  ADLScreening [facility] Assessment Services) Patient's cognitive ability adequate to safely complete daily activities?: Yes Patient able to express need for assistance with ADLs?: Yes Independently performs ADLs?: Yes (appropriate for developmental age)  Prior Inpatient Therapy Prior Inpatient Therapy: Yes Prior Therapy Dates: Per chart, 2017. Prior [facility]  Reason for Treatment: alcohol treatment and Schizophrenia.  Prior Outpatient Therapy Prior Outpatient Therapy: No Prior Therapy Dates: NA Prior Therapy Facilty/Provider(s): NA Reason for Treatment: NA Does patient have an ACCT team?: No Does patient have Intensive In-House Services?  : No Does patient have [facility] services? : No Does patient have P4[facility] services?: No  ADL Screening (condition at time of admission) Patient's cognitive ability adequate to safely complete daily activities?: Yes Is the patient deaf or have difficulty hearing?: No Does the patient have difficulty seeing, even when wearing glasses/contacts?: No(Pt denies. ) Does the patient have difficulty concentrating, remembering, or making decisions?: Yes Patient able to express need for assistance with ADLs?: Yes Does the patient have difficulty dressing or bathing?: No Independently performs ADLs?: Yes (appropriate for developmental age) Does the patient have difficulty walking or climbing stairs?: No Weakness of Legs: None Weakness of Arms/Hands: None  Home Assistive Devices/Equipment Home Assistive Devices/Equipment: None    Abuse/Neglect Assessment (Assessment to be complete while patient is alone) Abuse/Neglect Assessment Can Be Completed: Yes Physical Abuse: Denies(Pt  denies. )  Verbal Abuse: Denies(Pt denies. ) Sexual Abuse: Denies(Pt denies. ) Exploitation of patient/patient's resources: Denies(Pt denies. ) Self-Neglect: Denies(Pt denies. )     Advance Directives (For Healthcare) Does Patient Have a Medical Advance Directive?: No Would patient like information on creating a medical advance directive?: No - Patient declined    Additional Information 1:1 In Past 12 Months?: No CIRT Risk: No Elopement Risk: No Does patient have medical clearance?: Yes     Disposition: Nira ConnJason Berry, NP recommends observation for safety and stabilization. Disposition discussed with Dr. Eudelia Bunchardama and Everardo PacificKenisha, RN.    Disposition Initial Assessment Completed for this Encounter: Yes Disposition of Patient: Other dispositions(observation for safety and stabilization.) Other disposition(s): Other (Comment)(observation for safety and stabilization.)  On Site Evaluation by:  Jenny Reichmannreylese Judeen Geralds, MS, LPC, CRC Reviewed with Physician: Dr. Eudelia Bunchardama and Nira ConnJason Berry, NP.   Redmond Pullingreylese D Broly Hatfield 11/10/2017 8:54 PM   Redmond Pullingreylese D Rodricus Candelaria, MS, [facility], [facility] Triage Specialist [phone]

## 2017-11-10 NOTE — ED Triage Notes (Signed)
[facility], sts the voices are telling him "im going to kill you"  Patient sts it happened today and several times in the past.  Denies VH.  Patient denies SI/HI.  Patient requesting inpatient therapy, requests that he needs to be on medication; he ran out and needs refills.

## 2017-11-10 NOTE — ED Provider Notes (Signed)
[facility]-EMERGENCY DEPT Provider Note  CSN: [id number] Arrival date & time: 11/10/17 1750  Chief Complaint(s) Medical Clearance  HPI Cole Barrett is a 31 y.o. male with a history of paranoid behavior and schizophrenia who presents to the emergency department with increase in the volume of his auditory hallucinations.  Patient reports that he was admitted previously and placed on medicine for his schizophrenia which she has been taken up until several weeks ago when he ran out of the medication.  He reports that he did not have any prescription for it so he cannot fill it.  States that he did not go to day [employer] [facility] for follow-up.  Reports that over the past day the voices have become louder and stating "we are going to kill you."  Patient denies any suicidal ideations, homicidal ideations, visual hallucinations.  He denies any other physical complaints.  Denies any recent illicit drug use or alcohol use.  HPI  Past Medical History Past Medical History:  Diagnosis Date  . Homelessness   . Hypercholesterolemia   . Mental disorder   . Paranoid behavior (HCC)   . PE (pulmonary embolism)   . Schizophrenia [facility](HCC)    Patient Active Problem List   Diagnosis Date Noted  . MDD (major depressive disorder) 09/22/2017  . Alcohol abuse   . Auditory hallucinations   . Alcohol abuse with alcohol-induced mood disorder (HCC) 09/08/2016  . Adjustment disorder with depressed mood 09/07/2016  . Homelessness 09/03/2016  . Person feigning illness 09/03/2016  . 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 Cannabis use disorder, mild, abuse 08/29/2016  . Paranoid schizophrenia (HCC) 08/26/2016  . Pulmonary emboli (HCC) 07/13/2016  . 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 Acute left flank pain 01/02/2014  . Pulmonary embolism, bilateral (HCC) 01/02/2014   Home Medication(s) Prior to Admission medications   Medication Sig Start Date End Date  Taking? Authorizing Provider  FLUoxetine (PROZAC) 20 MG tablet Take 1 tablet (20 mg total) by mouth daily. For mood control 09/27/17  Yes Money, Gerlene Burdockravis B, FNP  risperiDONE (RISPERDAL) 1 MG tablet Take 1 tablet (1 mg total) by mouth at bedtime. For mood control 09/26/17  Yes Money, Gerlene Burdockravis B, FNP  benztropine (COGENTIN) 0.5 MG tablet Take 1 tablet (0.5 mg total) by mouth at bedtime. For EPS Patient not taking: Reported on 11/10/2017 09/26/17   Money, Gerlene Burdockravis B, FNP                                                                                                                                    Past Surgical History History reviewed. No pertinent surgical history. Family History Family History  Problem Relation Age of Onset  . Hypertension Mother     Social History Social History   Tobacco Use  . Smoking status: Current Every Day Smoker    Packs/day: 0.50    Types: Cigarettes  . Smokeless  tobacco: Never Used  Substance Use Topics  . Alcohol use: Yes    Comment: socially  . Drug use: Yes    Types: Marijuana    Comment: Once every 2-3 months, Last used: Last month   Allergies Haldol [haloperidol]  Review of Systems Review of Systems All other systems are reviewed and are negative for acute change except as noted in the HPI  Physical Exam Vital Signs  I have reviewed the triage vital signs BP (!) 163/93 (BP Location: Left Arm)   Pulse (!) 105   Temp 98.3 F (36.8 C) (Oral)   Resp 18   Ht 5\' 10"  (1.778 m)   Wt 99.8 kg (220 lb)   SpO2 97%   BMI 31.57 kg/m   Physical Exam  Constitutional: He is oriented to person, place, and time. He appears well-developed and well-nourished. No distress.  HENT:  Head: Normocephalic and atraumatic.  Nose: Nose normal.  Eyes: Conjunctivae and EOM are normal. Pupils are equal, round, and reactive to light. Right eye exhibits no discharge. Left eye exhibits no discharge. No scleral icterus.  Neck: Normal range of motion. Neck supple.    Cardiovascular: Normal rate and regular rhythm. Exam reveals no gallop and no friction rub.  No murmur heard. Pulmonary/Chest: Effort normal and breath sounds normal. No stridor. No respiratory distress. He has no rales.  Abdominal: Soft. He exhibits no distension. There is no tenderness.  Musculoskeletal: He exhibits no edema or tenderness.  Neurological: He is alert and oriented to person, place, and time.  Skin: Skin is warm and dry. No rash noted. He is not diaphoretic. No erythema.  Psychiatric: He has a normal mood and affect. He is actively hallucinating. He expresses no homicidal and no suicidal ideation.  Vitals reviewed.   ED Results and Treatments Labs (all labs ordered are listed, but only abnormal results are displayed) Labs Reviewed  COMPREHENSIVE METABOLIC PANEL - Abnormal; Notable for the following components:      Result Value   Glucose, Bld 116 (*)    AST 60 (*)    ALT 85 (*)    All other components within normal limits  ACETAMINOPHEN LEVEL - Abnormal; Notable for the following components:   Acetaminophen (Tylenol), Serum <10 (*)    All other components within normal limits  ETHANOL  SALICYLATE LEVEL  CBC  RAPID URINE DRUG SCREEN, HOSP PERFORMED                                                                                                                         EKG  EKG Interpretation  Date/Time:    Ventricular Rate:    PR Interval:    QRS Duration:   QT Interval:    QTC Calculation:   R Axis:     Text Interpretation:        Radiology No results found. Pertinent labs & imaging results that were available during my care of the patient were reviewed by me and considered  in my medical decision making (see chart for details).  Medications Ordered in ED Medications  FLUoxetine (PROZAC) capsule 20 mg (not administered)  risperiDONE (RISPERDAL) tablet 1 mg (not administered)  benztropine (COGENTIN) tablet 0.5 mg (not administered)  acetaminophen  (TYLENOL) tablet 650 mg (not administered)                                                                                                                                    Procedures Procedures  (including critical care time)  Medical Decision Making / ED Course I have reviewed the nursing notes for this encounter and the patient's prior records (if available in EHR or on provided paperwork).     Patient is medical here for TTS evaluation.  TTS recommended reevaluation in the morning.   Final Clinical Impressio CTS evaluation.n(s) / ED Diagnoses Final diagnoses:  Auditory hallucination      This chart was dictated using voice recognition software.  Despite best efforts to proofread,  errors can occur which can change the documentation meaning.   [name], MD 11/11/17 [phone]

## 2017-11-11 DIAGNOSIS — F4321 Adjustment disorder with depressed mood: Secondary | ICD-10-CM

## 2017-11-11 DIAGNOSIS — F1210 Cannabis abuse, uncomplicated: Secondary | ICD-10-CM

## 2017-11-11 DIAGNOSIS — F17210 Nicotine dependence, cigarettes, uncomplicated: Secondary | ICD-10-CM

## 2017-11-11 MED ORDER — BENZTROPINE MESYLATE 0.5 MG PO TABS: 0.5000 mg | ORAL_TABLET | Freq: Every day | ORAL | 0 refills | Status: DC

## 2017-11-11 MED ORDER — FLUOXETINE HCL 20 MG PO TABS: 20.0000 mg | ORAL_TABLET | Freq: Every day | ORAL | 0 refills | Status: DC

## 2017-11-11 MED ORDER — RISPERIDONE 1 MG PO TABS: 1.0000 mg | ORAL_TABLET | Freq: Every day | ORAL | 0 refills | Status: DC

## 2017-11-11 NOTE — ED Notes (Signed)
SBAR Report received from previous nurse. Pt received calm and visible on unit. Pt denies current SI/ HI, V H, depression, anxiety, or pain at this time, and appears otherwise stable and free of distress Pt endorses hearing voices earlier in the day telling them they were going to kill him.. Pt reminded of camera surveillance, q 15 min rounds, and rules of the milieu. Will continue to assess.

## 2017-11-11 NOTE — [facility] Suicide Risk Assessment (Signed)
Suicide Risk Assessment  Discharge Assessment   [facility]   Principal Problem: Adjustment disorder with depressed mood Discharge Diagnoses:  Patient Active Problem List   Diagnosis Date Noted  . Adjustment disorder with depressed mood [F43.21] 09/07/2016    Priority: High  . Homelessness [Z59.0] 09/03/2016    Priority: High  . Person feigning illness [Z76.5] 09/03/2016    Priority: High  . Alcohol abuse with alcohol-induced mood disorder (HCC) [F10.14] 09/08/2016    Priority: Low  . MDD (major depressive disorder) [F32.9] 09/22/2017  . Alcohol abuse [F10.10]   . Auditory hallucinations [R44.0]   . 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 Cannabis use disorder, mild, abuse [F12.10] 08/29/2016  . Pulmonary emboli (HCC) [I26.99] 07/13/2016  . Pulmonary embolism and infarction (HCC) [I26.99] 01/02/2014  . Acute left flank pain [R10.9] 01/02/2014  . Pulmonary embolism, bilateral (HCC) [I26.99] 01/02/2014    Total Time spent with patient: 45 minutes   Musculoskeletal: Strength & Muscle Tone: within normal limits Gait & Station: normal Patient leans: N/A  Psychiatric Specialty Exam: Physical Exam  Constitutional: He is oriented to person, place, and time. He appears well-developed and well-nourished.  HENT:  Head: Normocephalic.  Neck: Normal range of motion.  Respiratory: Effort normal.  Musculoskeletal: Normal range of motion.  Neurological: He is alert and oriented to person, place, and time.  Psychiatric: He has a normal mood and affect. His speech is normal and behavior is normal. Judgment and thought content normal. Cognition and memory are normal.    Review of Systems  All other systems reviewed and are negative.   Blood pressure (!) 109/59, pulse 78, temperature 97.8 F (36.6 C), temperature source Oral, resp. rate 18, height 5\' 10"  (1.778 m), weight 99.8 kg (220 lb), SpO2 100 %.Body mass index is 31.57 kg/m.  General  Appearance: Casual  Eye Contact:  Good  Speech:  Normal Rate  Volume:  Normal  Mood:  Euthymic  Affect:  Congruent  Thought Process:  Coherent and Descriptions of Associations: Intact  Orientation:  Full (Time, Place, and Person)  Thought Content:  WDL and Logical  Suicidal Thoughts:  No  Homicidal Thoughts:  No  Memory:  Immediate;   Good Recent;   Good Remote;   Good  Judgement:  Fair  Insight:  Fair  Psychomotor Activity:  Normal  Concentration:  Concentration: Good and Attention Span: Good  Recall:  Good  Fund of Knowledge:  Fair  Language:  Good  Akathisia:  No  Handed:  Right  AIMS (if indicated):     Assets:  Leisure Time Physical Health Resilience Social Support  ADL's:  Intact  Cognition:  WNL  Sleep:       Mental Status Per Nursing Assessment::   On Admission:   hallucinations  Demographic Factors:  Male  Loss Factors: NA  Historical Factors: NA  Risk Reduction Factors:   Sense of responsibility to family, Employed and Positive social support  Continued Clinical Symptoms:  NOne  Cognitive Features That Contribute To Risk:  None    Suicide Risk:  Minimal: No identifiable suicidal ideation.  Patients presenting with no risk factors but with morbid ruminations; may be classified as minimal risk based on the severity of the depressive symptoms    Plan Of Care/Follow-up recommendations:  Activity:  as tolerated Diet:  heart healthy diet  Alexias Margerum, NP 11/11/2017, 2:51 PM

## 2017-11-11 NOTE — Consult Note (Addendum)
[facility] Consult   Reason for Consult:  Hallucinations  Referring Physician:  EDP Patient Identification: Cole Barrett MRN:  [id number] Principal Diagnosis: Adjustment disorder with depressed mood Diagnosis:   Patient Active Problem List   Diagnosis Date Noted  . Adjustment disorder with depressed mood [F43.21] 09/07/2016    Priority: High  . Homelessness [Z59.0] 09/03/2016    Priority: High  . Person feigning illness [Z76.5] 09/03/2016    Priority: High  . Alcohol abuse with alcohol-induced mood disorder ([place]) [F10.14] 09/08/2016    Priority: Low  . MDD (major depressive disorder) [F32.9] 09/22/2017  . Alcohol abuse [F10.10]   . Auditory hallucinations [R44.0]   . Brief psychotic disorder ([place]) [F23] 08/29/2016  . Cannabis use disorder, mild, abuse [F12.10] 08/29/2016  . Pulmonary emboli ([place]) [I26.99] 07/13/2016  . Pulmonary embolism and infarction ([place]) [I26.99] 01/02/2014  . Acute left flank pain [R10.9] 01/02/2014  . Pulmonary embolism, bilateral ([place]) [I26.99] 01/02/2014    Total Time spent with patient: 45 minutes  Subjective:   Cole Barrett is a 31 y.o. male patient does not warrant admission  HPI:  31 yo male who presented to the ED with hallucinations and requesting a refill on his medications.  He is well known to this facility for similar presentations.  Medications were restarted yesterday and he has stabilized.  Rx provided but told that this would be the last time as he needs to go to his follow-up with [facility], which he does not do.  Patient agreeable.  No suicidal/homicidal ideations, hallucinations, or substance abuse.  STable for discharge.  Past Psychiatric History: substance abuse, schizoaffective disorder.  Risk to Self: NOne Risk to Others: Homicidal Ideation: No(Pt denies.) Thoughts of Harm to Others: No Current Homicidal Intent: No Current Homicidal Plan: No Access to Homicidal Means: No Identified Victim:  NA History of harm to others?: No Assessment of Violence: None Noted Violent Behavior Description: NA Does patient have access to weapons?: No(Pt denies. ) Criminal Charges Pending?: No Does patient have a court date: No Prior Inpatient Therapy: Prior Inpatient Therapy: Yes Prior Therapy Dates: Per chart, 2017. Prior Therapy Facilty/Provider(s): Per chart, [facility] and [facility].  Reason for Treatment: alcohol treatment and Schizophrenia. Prior Outpatient Therapy: Prior Outpatient Therapy: No Prior Therapy Dates: NA Prior Therapy Facilty/Provider(s): NA Reason for Treatment: NA Does patient have an ACCT team?: No Does patient have Intensive In-House Services?  : No Does patient have [facility] services? : No Does patient have P4CC services?: No  Past Medical History:  Past Medical History:  Diagnosis Date  . Homelessness   . Hypercholesterolemia   . Mental disorder   . Paranoid behavior ([place])   . PE (pulmonary embolism)   . Schizophrenia ([place])    History reviewed. No pertinent surgical history. Family History:  Family History  Problem Relation Age of Onset  . Hypertension Mother    Family Psychiatric  History: none Social History:  Social History   Substance and Sexual Activity  Alcohol Use Yes   Comment: socially     Social History   Substance and Sexual Activity  Drug Use Yes  . Types: Marijuana   Comment: Once every 2-3 months, Last used: Last month    Social History   Socioeconomic History  . Marital status: Single    Spouse name: None  . Number of children: None  . Years of education: None  . Highest education level: None  Social Needs  . Financial resource strain: None  .  Food insecurity - worry: None  . Food insecurity - inability: None  . Transportation needs - medical: None  . Transportation needs - non-medical: None  Occupational History  . None  Tobacco Use  . Smoking status: Current Every Day Smoker    Packs/day: 0.50    Types:  Cigarettes  . Smokeless tobacco: Never Used  Substance and Sexual Activity  . Alcohol use: Yes    Comment: socially  . Drug use: Yes    Types: Marijuana    Comment: Once every 2-3 months, Last used: Last month  . Sexual activity: No  Other Topics Concern  . None  Social History Narrative  . None   Additional Social History:    Allergies:   Allergies  Allergen Reactions  . Haldol [Haloperidol] Shortness Of Breath    Labs:  Results for orders placed or performed during the hospital encounter of 11/10/17 (from the past 48 hour(s))  Rapid urine drug screen (hospital performed)     Status: None   Collection Time: 11/10/17  7:21 PM  Result Value Ref Range   Opiates NONE DETECTED NONE DETECTED   Cocaine NONE DETECTED NONE DETECTED   Benzodiazepines NONE DETECTED NONE DETECTED   Amphetamines NONE DETECTED NONE DETECTED   Tetrahydrocannabinol NONE DETECTED NONE DETECTED   Barbiturates NONE DETECTED NONE DETECTED    Comment:        DRUG SCREEN FOR MEDICAL PURPOSES ONLY.  IF CONFIRMATION IS NEEDED FOR ANY PURPOSE, NOTIFY LAB WITHIN 5 DAYS.        LOWEST DETECTABLE LIMITS FOR URINE DRUG SCREEN Drug Class       Cutoff (ng/mL) Amphetamine      1000 Barbiturate      200 Benzodiazepine   956 Tricyclics       387 Opiates          300 Cocaine          300 THC              50   Comprehensive metabolic panel     Status: Abnormal   Collection Time: 11/10/17  8:40 PM  Result Value Ref Range   Sodium 139 135 - 145 mmol/L   Potassium 3.8 3.5 - 5.1 mmol/L   Chloride 103 101 - 111 mmol/L   CO2 24 22 - 32 mmol/L   Glucose, Bld 116 (H) 65 - 99 mg/dL   BUN 15 6 - 20 mg/dL   Creatinine, Ser 1.00 0.61 - 1.24 mg/dL   Calcium 8.9 8.9 - 10.3 mg/dL   Total Protein 7.1 6.5 - 8.1 g/dL   Albumin 4.0 3.5 - 5.0 g/dL   AST 60 (H) 15 - 41 U/L   ALT 85 (H) 17 - 63 U/L   Alkaline Phosphatase 46 38 - 126 U/L   Total Bilirubin 0.8 0.3 - 1.2 mg/dL   GFR calc non Af Amer >60 >60 mL/min   GFR  calc Af Amer >60 >60 mL/min    Comment: (NOTE) The eGFR has been calculated using the CKD EPI equation. This calculation has not been validated in all clinical situations. eGFR's persistently <60 mL/min signify possible Chronic Kidney Disease.    Anion gap 12 5 - 15  Ethanol     Status: None   Collection Time: 11/10/17  8:40 PM  Result Value Ref Range   Alcohol, Ethyl (B) <10 <10 mg/dL    Comment:        LOWEST DETECTABLE LIMIT FOR SERUM ALCOHOL IS 10  mg/dL FOR MEDICAL PURPOSES ONLY   Salicylate level     Status: None   Collection Time: 11/10/17  8:40 PM  Result Value Ref Range   Salicylate Lvl <4.0 2.8 - 30.0 mg/dL  Acetaminophen level     Status: Abnormal   Collection Time: 11/10/17  8:40 PM  Result Value Ref Range   Acetaminophen (Tylenol), Serum <10 (L) 10 - 30 ug/mL    Comment:        THERAPEUTIC CONCENTRATIONS VARY SIGNIFICANTLY. A RANGE OF 10-30 ug/mL MAY BE AN EFFECTIVE CONCENTRATION FOR MANY PATIENTS. HOWEVER, SOME ARE BEST TREATED AT CONCENTRATIONS OUTSIDE THIS RANGE. ACETAMINOPHEN CONCENTRATIONS >150 ug/mL AT 4 HOURS AFTER INGESTION AND >50 ug/mL AT 12 HOURS AFTER INGESTION ARE OFTEN ASSOCIATED WITH TOXIC REACTIONS.   cbc     Status: None   Collection Time: 11/10/17  8:40 PM  Result Value Ref Range   WBC 6.1 4.0 - 10.5 K/uL   RBC 4.56 4.22 - 5.81 MIL/uL   Hemoglobin 13.0 13.0 - 17.0 g/dL   HCT 39.0 39.0 - 52.0 %   MCV 85.5 78.0 - 100.0 fL   MCH 28.5 26.0 - 34.0 pg   MCHC 33.3 30.0 - 36.0 g/dL   RDW 13.5 11.5 - 15.5 %   Platelets 307 150 - 400 K/uL    Current Facility-Administered Medications  Medication Dose Route Frequency Provider Last Rate Last Dose  . acetaminophen (TYLENOL) tablet 650 mg  650 mg Oral Q6H PRN Cardama, Grayce Sessions, MD      . benztropine (COGENTIN) tablet 0.5 mg  0.5 mg Oral QHS Cardama, Grayce Sessions, MD   0.5 mg at 11/11/17 0137  . FLUoxetine (PROZAC) capsule 20 mg  20 mg Oral Daily Cardama, Grayce Sessions, MD   20 mg at  11/11/17 1019  . risperiDONE (RISPERDAL) tablet 1 mg  1 mg Oral QHS Cardama, Grayce Sessions, MD   1 mg at 11/11/17 0137   Current Outpatient Medications  Medication Sig Dispense Refill  . FLUoxetine (PROZAC) 20 MG tablet Take 1 tablet (20 mg total) by mouth daily. For mood control 30 tablet 0  . risperiDONE (RISPERDAL) 1 MG tablet Take 1 tablet (1 mg total) by mouth at bedtime. For mood control 30 tablet 0  . benztropine (COGENTIN) 0.5 MG tablet Take 1 tablet (0.5 mg total) by mouth at bedtime. For EPS (Patient not taking: Reported on 11/10/2017) 30 tablet 0    Musculoskeletal: Strength & Muscle Tone: within normal limits Gait & Station: normal Patient leans: N/A  Psychiatric Specialty Exam: Physical Exam  Constitutional: He is oriented to person, place, and time. He appears well-developed and well-nourished.  HENT:  Head: Normocephalic.  Neck: Normal range of motion.  Respiratory: Effort normal.  Musculoskeletal: Normal range of motion.  Neurological: He is alert and oriented to person, place, and time.  Psychiatric: He has a normal mood and affect. His speech is normal and behavior is normal. Judgment and thought content normal. Cognition and memory are normal.    Review of Systems  All other systems reviewed and are negative.   Blood pressure (!) 109/59, pulse 78, temperature 97.8 F (36.6 C), temperature source Oral, resp. rate 18, height '5\' 10"'$  (1.778 m), weight 99.8 kg (220 lb), SpO2 100 %.Body mass index is 31.57 kg/m.  General Appearance: Casual  Eye Contact:  Good  Speech:  Normal Rate  Volume:  Normal  Mood:  Euthymic  Affect:  Congruent  Thought Process:  Coherent and Descriptions of Associations:  Intact  Orientation:  Full (Time, Place, and Person)  Thought Content:  WDL and Logical  Suicidal Thoughts:  No  Homicidal Thoughts:  No  Memory:  Immediate;   Good Recent;   Good Remote;   Good  Judgement:  Fair  Insight:  Fair  Psychomotor Activity:  Normal   Concentration:  Concentration: Good and Attention Span: Good  Recall:  Good  Fund of Knowledge:  Fair  Language:  Good  Akathisia:  No  Handed:  Right  AIMS (if indicated):     Assets:  Leisure Time Physical Health Resilience Social Support  ADL's:  Intact  Cognition:  WNL  Sleep:        Treatment Plan Summary: Daily contact with patient to assess and evaluate symptoms and progress in treatment, Medication management and Plan adjustment disorder with depressed mood:  -Crisis stabilization -Medication management:  Restarted Risperdal 1 mg at bedtime for psychosis,Prozac 20 mg daily for depression, and Cogentin 0.5 mg daily for EPS -Individual counseling  Disposition: No evidence of imminent risk to self or others at present.    Waylan Boga, NP 11/11/2017 11:04 AM  Patient seen face-to-face for psychiatric evaluation, chart reviewed and case discussed with the physician extender and developed treatment plan. Reviewed the information documented and agree with the treatment plan. Corena Pilgrim, MD

## 2017-11-17 ENCOUNTER — Encounter (HOSPITAL_COMMUNITY): Payer: Self-pay | Admitting: Emergency Medicine

## 2017-11-17 ENCOUNTER — Other Ambulatory Visit: Payer: Self-pay

## 2017-11-17 ENCOUNTER — Emergency Department (HOSPITAL_COMMUNITY)
Admission: EM | Admit: 2017-11-17 | Discharge: 2017-11-17 | Disposition: A | Discharge status: Home / Self Care | Payer: Self-pay | Attending: Emergency Medicine | Admitting: Emergency Medicine

## 2017-11-17 ENCOUNTER — Encounter (HOSPITAL_COMMUNITY): Payer: Self-pay | Admitting: *Deleted

## 2017-11-17 ENCOUNTER — Emergency Department (HOSPITAL_COMMUNITY): Payer: Self-pay

## 2017-11-17 DIAGNOSIS — F17210 Nicotine dependence, cigarettes, uncomplicated: Secondary | ICD-10-CM | POA: Insufficient documentation

## 2017-11-17 DIAGNOSIS — R440 Auditory hallucinations: Secondary | ICD-10-CM | POA: Insufficient documentation

## 2017-11-17 DIAGNOSIS — Z86718 Personal history of other venous thrombosis and embolism: Secondary | ICD-10-CM | POA: Insufficient documentation

## 2017-11-17 DIAGNOSIS — E7800 Pure hypercholesterolemia, unspecified: Secondary | ICD-10-CM | POA: Insufficient documentation

## 2017-11-17 DIAGNOSIS — F209 Schizophrenia, unspecified: Secondary | ICD-10-CM | POA: Insufficient documentation

## 2017-11-17 DIAGNOSIS — Z79899 Other long term (current) drug therapy: Secondary | ICD-10-CM | POA: Insufficient documentation

## 2017-11-17 DIAGNOSIS — M25571 Pain in right ankle and joints of right foot: Secondary | ICD-10-CM | POA: Insufficient documentation

## 2017-11-17 DIAGNOSIS — D649 Anemia, unspecified: Secondary | ICD-10-CM | POA: Insufficient documentation

## 2017-11-17 DIAGNOSIS — W19XXXA Unspecified fall, initial encounter: Secondary | ICD-10-CM

## 2017-11-17 NOTE — Discharge Instructions (Signed)
X-ray of your right foot does not show fracture or dislocation.  You have an ankle sprain.  Please take 600 mg ibuprofen every 6 hours as needed for pain.  You can also take Tylenol for pain.  Please elevate the right foot when you can.  Ice the right ankle at least twice a day for the next several days.  I [facility] the [facility]  [facility] [place] and accept patients who do not have insurance.  Please call and schedule an appointment to establish care and for follow-up of your ankle pain.  Return to the emergency department for any new or worsening symptoms.

## 2017-11-17 NOTE — ED Triage Notes (Signed)
The pt reports that he was here earlier for something else and then he left and started hearing voices and he got the poilce to drop him off

## 2017-11-17 NOTE — ED Provider Notes (Signed)
[name] Note   CSN: [id number] Arrival date & time: 11/17/17  1336     History   Chief Complaint Chief Complaint  Patient presents with  . Ankle Pain    HPI Jacquelin HawkingChristopher Adam Cosman is a 31 y.o. male.  HPI   [name] is a 31yo male with a history of schizophrenia, alcohol abuse, cannabis use disorder who presents to the emergency department for evaluation of right ankle pain.  Patient states he rolled his right ankle inward while playing basketball last night.  He has had persistent 5/10 severity pain over the medial malleolus since that time.  Pain is worsened with right foot dorsiflexion or with weightbearing.  Has not taken any over-the-counter medications for his symptoms.  Denies numbness, weakness, open wounds or fevers.  He is able to ambulate independently despite pain.  Past Medical History:  Diagnosis Date  . Homelessness   . Hypercholesterolemia   . Mental disorder   . Paranoid behavior (HCC)   . PE (pulmonary embolism)   . Schizophrenia [facility](HCC)     Patient Active Problem List   Diagnosis Date Noted  . MDD (major depressive disorder) 09/22/2017  . Alcohol abuse   . Auditory hallucinations   . Alcohol abuse with alcohol-induced mood disorder (HCC) 09/08/2016  . Adjustment disorder with depressed mood 09/07/2016  . Homelessness 09/03/2016  . Person feigning illness 09/03/2016  . 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 Cannabis use disorder, mild, abuse 08/29/2016  . Pulmonary emboli (HCC) 07/13/2016  . 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 Acute left flank pain 01/02/2014  . Pulmonary embolism, bilateral (HCC) 01/02/2014    History reviewed. No pertinent surgical history.     Home Medications    Prior to Admission medications   Medication Sig Start Date End Date Taking? Authorizing Provider  benztropine (COGENTIN) 0.5 MG tablet Take 1 tablet (0.5 mg total) by mouth at  bedtime. For EPS 11/11/17   Charm RingsLord, Jamison Y, NP  FLUoxetine (PROZAC) 20 MG tablet Take 1 tablet (20 mg total) by mouth daily. For mood control 11/11/17   Charm RingsLord, Jamison Y, NP  risperiDONE (RISPERDAL) 1 MG tablet Take 1 tablet (1 mg total) by mouth at bedtime. For mood control 11/11/17   Charm RingsLord, Jamison Y, NP    Family History Family History  Problem Relation Age of Onset  . Hypertension Mother     Social History Social History   Tobacco Use  . Smoking status: Current Every Day Smoker    Packs/day: 0.50    Types: Cigarettes  . Smokeless tobacco: Never Used  Substance Use Topics  . Alcohol use: Yes    Comment: socially  . Drug use: Yes    Types: Marijuana    Comment: Once every 2-3 months, Last used: Last month     Allergies   Haldol [haloperidol]   Review of Systems Review of Systems  Constitutional: Negative for fever.  Musculoskeletal: Positive for arthralgias (right ankle) and joint swelling. Negative for gait problem.  Skin: Negative for wound.  Neurological: Negative for weakness and numbness.     Physical Exam Updated Vital Signs BP 131/75 (BP Location: Right Arm)   Pulse 73   Temp 98.1 F (36.7 C) (Oral)   Resp 18   Ht 5\' 10"  (1.778 m)   Wt 99.8 kg (220 lb)   SpO2 99%   BMI 31.57 kg/m   Physical Exam  Constitutional: He is oriented to person, place, and  time. He appears well-developed and well-nourished. No distress.  HENT:  Head: Normocephalic and atraumatic.  Eyes: Right eye exhibits no discharge. Left eye exhibits no discharge.  Pulmonary/Chest: Effort normal. No respiratory distress.  Musculoskeletal:  Right ankle with tenderness to palpation over the medial malleolus which is swollen compared to left.  Full active ROM with dorsiflexion/plantar flexion and inversion/eversion. No erythema, ecchymosis, or deformity appreciated. No break in skin. No pain to fifth metatarsal area or navicular region. Achilles intact. DP pulses 2+ bilaterally.     Neurological: He is alert and oriented to person, place, and time. Coordination normal.  Distal sensation to light/sharp touch intact in right lower extremity.  Skin: Skin is warm and dry. Capillary refill takes less than 2 seconds. He is not diaphoretic.  Psychiatric: He has a normal mood and affect. His behavior is normal.  Nursing note and vitals reviewed.    ED Treatments / Results  Labs (all labs ordered are listed, but only abnormal results are displayed) Labs Reviewed - No data to display  EKG  EKG Interpretation None       Radiology Dg Ankle Complete Right  Result Date: 11/17/2017 CLINICAL DATA:  Injury to the right foot and ankle EXAM: RIGHT ANKLE - COMPLETE 3+ VIEW COMPARISON:  12/23/2016 FINDINGS: No fracture or malalignment. Small plantar calcaneal spur. Mild lateral soft tissue swelling. IMPRESSION: Mild soft tissue swelling.  No definite acute osseous abnormality. Electronically Signed   By: Jasmine PangKim  Fujinaga M.D.   On: 11/17/2017 14:47    Procedures Procedures (including critical care time)  Medications Ordered in ED Medications - No data to display   Initial Impression / Assessment and Plan / ED Course  I have reviewed the triage vital signs and the nursing notes.  Pertinent labs & imaging results that were available during my care of the patient were reviewed by me and considered in my medical decision making (see chart for details).     X-ray of right foot without acute fracture or dislocation.  Exam consistent with ankle sprain.  Foot is neurovascularly intact on exam.  No erythema, warmth or induration to suggest infection.  Have counseled patient on NSAID use and RICE protocol.  Patient is able to ambulate independently, states he does not want crutches or ankle brace.  Have given information to follow-up with [facility] if his pain continues given he does not have a primary care provider.  Patient agrees and voiced understanding to the above plan.    Final Clinical Impressions(s) / ED Diagnoses   Final diagnoses:  Acute right ankle pain    ED Discharge Orders    None       Lawrence MarseillesShrosbree, Emily J, PA-C 11/17/17 1522    Azalia Bilisampos, Kevin, MD 11/17/17 2300

## 2017-11-17 NOTE — ED Triage Notes (Signed)
Hurt rt ankle playing b ball last night, hurts to walk on it

## 2017-11-18 ENCOUNTER — Emergency Department (HOSPITAL_COMMUNITY)
Admission: EM | Admit: 2017-11-18 | Discharge: 2017-11-18 | Disposition: A | Discharge status: Home / Self Care | Payer: Self-pay | Attending: Emergency Medicine | Admitting: Emergency Medicine

## 2017-11-18 DIAGNOSIS — F1210 Cannabis abuse, uncomplicated: Secondary | ICD-10-CM

## 2017-11-18 DIAGNOSIS — D649 Anemia, unspecified: Secondary | ICD-10-CM

## 2017-11-18 DIAGNOSIS — R440 Auditory hallucinations: Secondary | ICD-10-CM

## 2017-11-18 DIAGNOSIS — F17210 Nicotine dependence, cigarettes, uncomplicated: Secondary | ICD-10-CM

## 2017-11-18 DIAGNOSIS — F209 Schizophrenia, unspecified: Secondary | ICD-10-CM

## 2017-11-18 LAB — BASIC METABOLIC PANEL
Anion gap: 8 (ref 5–15)
BUN: 12 mg/dL (ref 6–20)
CHLORIDE: 106 mmol/L (ref 101–111)
CO2: 24 mmol/L (ref 22–32)
CREATININE: 0.95 mg/dL (ref 0.61–1.24)
Calcium: 8.5 mg/dL — ABNORMAL LOW (ref 8.9–10.3)
GFR calc Af Amer: >60 mL/min (ref >60)
GFR calc non Af Amer: >60 mL/min (ref >60)
GLUCOSE: 92 mg/dL (ref 65–99)
POTASSIUM: 3.8 mmol/L (ref 3.5–5.1)
Sodium: 138 mmol/L (ref 135–145)

## 2017-11-18 LAB — CBC WITH DIFFERENTIAL/PLATELET
BASOS ABS: 0 10*3/uL (ref 0.0–0.1)
Basophils Relative: 0 %
Eosinophils Absolute: 0.2 10*3/uL (ref 0.0–0.7)
Eosinophils Relative: 4 %
HEMATOCRIT: 35.8 % — AB (ref 39.0–52.0)
Hemoglobin: 11.8 g/dL — ABNORMAL LOW (ref 13.0–17.0)
LYMPHS ABS: 2 10*3/uL (ref 0.7–4.0)
LYMPHS PCT: 41 %
MCH: 28.2 pg (ref 26.0–34.0)
MCHC: 33 g/dL (ref 30.0–36.0)
MCV: 85.6 fL (ref 78.0–100.0)
MONO ABS: 0.4 10*3/uL (ref 0.1–1.0)
MONOS PCT: 9 %
NEUTROS ABS: 2.2 10*3/uL (ref 1.7–7.7)
Neutrophils Relative %: 46 %
Platelets: 269 10*3/uL (ref 150–400)
RBC: 4.18 MIL/uL — ABNORMAL LOW (ref 4.22–5.81)
RDW: 13.2 % (ref 11.5–15.5)
WBC: 4.8 10*3/uL (ref 4.0–10.5)

## 2017-11-18 LAB — ETHANOL

## 2017-11-18 MED ORDER — ALUM & MAG HYDROXIDE-SIMETH 200-200-20 MG/5ML PO SUSP: 30.0000 mL | Freq: Four times a day (QID) | ORAL | Status: DC | PRN

## 2017-11-18 MED ORDER — RISPERIDONE 1 MG PO TABS
1.0000 mg | ORAL_TABLET | Freq: Every day | ORAL | Status: DC
  Administered 2017-11-18: 1 mg via ORAL
  Filled 2017-11-18: qty 1

## 2017-11-18 MED ORDER — BENZTROPINE MESYLATE 1 MG PO TABS
0.5000 mg | ORAL_TABLET | Freq: Every day | ORAL | Status: DC
  Administered 2017-11-18: 0.5 mg via ORAL
  Filled 2017-11-18: qty 1

## 2017-11-18 MED ORDER — FLUOXETINE HCL 20 MG PO CAPS
20.0000 mg | ORAL_CAPSULE | Freq: Every day | ORAL | Status: DC
  Administered 2017-11-18: 20 mg via ORAL
  Filled 2017-11-18: qty 1

## 2017-11-18 MED ORDER — ACETAMINOPHEN 325 MG PO TABS: 650.0000 mg | ORAL_TABLET | ORAL | Status: DC | PRN

## 2017-11-18 MED ORDER — NICOTINE 7 MG/24HR TD PT24
7.0000 mg | MEDICATED_PATCH | Freq: Every day | TRANSDERMAL | Status: DC
  Administered 2017-11-18: 7 mg via TRANSDERMAL
  Filled 2017-11-18: qty 1

## 2017-11-18 MED ORDER — ONDANSETRON HCL 4 MG PO TABS: 4.0000 mg | ORAL_TABLET | Freq: Three times a day (TID) | ORAL | Status: DC | PRN

## 2017-11-18 NOTE — ED Notes (Signed)
TTS at bedside. 

## 2017-11-18 NOTE — ED Provider Notes (Signed)
[name] Provider Note   CSN: [id number] Arrival date & time: 11/17/17  2113     History   Chief Complaint Chief Complaint  Patient presents with  . hearing voices    HPI Cole Barrett is a 31 y.o. male.  The history is provided by the patient.  He has a history of schizophrenia, pulmonary embolism, hyperlipidemia, alcohol abuse, cannabis abuse.  He comes in complaining of auditory hallucinations over the last 24 hours.  He states that they are getting loud and disturbing him.  He states the voices state that they will kill him.  He denies any actual suicidal ideation or homicidal ideation.  He denies drug or alcohol use currently.  He claims he has been compliant with his medications.  Past Medical History:  Diagnosis Date  . Homelessness   . Hypercholesterolemia   . Mental disorder   . Paranoid behavior (HCC)   . PE (pulmonary embolism)   . Schizophrenia [facility](HCC)     Patient Active Problem List   Diagnosis Date Noted  . MDD (major depressive disorder) 09/22/2017  . Alcohol abuse   . Auditory hallucinations   . Alcohol abuse with alcohol-induced mood disorder (HCC) 09/08/2016  . Adjustment disorder with depressed mood 09/07/2016  . Homelessness 09/03/2016  . Person feigning illness 09/03/2016  . 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 Cannabis use disorder, mild, abuse 08/29/2016  . Pulmonary emboli (HCC) 07/13/2016  . 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 Acute left flank pain 01/02/2014  . Pulmonary embolism, bilateral (HCC) 01/02/2014    History reviewed. No pertinent surgical history.     Home Medications    Prior to Admission medications   Medication Sig Start Date End Date Taking? Authorizing Provider  benztropine (COGENTIN) 0.5 MG tablet Take 1 tablet (0.5 mg total) by mouth at bedtime. For EPS 11/11/17   Charm RingsLord, Jamison Y, NP  FLUoxetine (PROZAC) 20 MG tablet Take 1 tablet (20 mg  total) by mouth daily. For mood control 11/11/17   Charm RingsLord, Jamison Y, NP  risperiDONE (RISPERDAL) 1 MG tablet Take 1 tablet (1 mg total) by mouth at bedtime. For mood control 11/11/17   Charm RingsLord, Jamison Y, NP    Family History Family History  Problem Relation Age of Onset  . Hypertension Mother     Social History Social History   Tobacco Use  . Smoking status: Current Every Day Smoker    Packs/day: 0.50    Types: Cigarettes  . Smokeless tobacco: Never Used  Substance Use Topics  . Alcohol use: Yes    Comment: socially  . Drug use: Yes    Types: Marijuana    Comment: Once every 2-3 months, Last used: Last month     Allergies   Haldol [haloperidol]   Review of Systems Review of Systems  All other systems reviewed and are negative.    Physical Exam Updated Vital Signs BP 120/74 (BP Location: Right Arm)   Pulse 73   Temp 98.3 F (36.8 C)   Resp 16   Ht 5\' 10"  (1.778 m)   SpO2 99%   BMI 31.57 kg/m   Physical Exam  Nursing note and vitals reviewed.  31 year old male, resting comfortably and in no acute distress. Vital signs are normal. Oxygen saturation is 99%, which is normal. Head is normocephalic and atraumatic. PERRLA, EOMI. Oropharynx is clear. Neck is nontender and supple without adenopathy or JVD. Back is nontender and there is no  CVA tenderness. Lungs are clear without rales, wheezes, or rhonchi. Chest is nontender. Heart has regular rate and rhythm without murmur. Abdomen is soft, flat, nontender without masses or hepatosplenomegaly and peristalsis is normoactive. Extremities have no cyanosis or edema, full range of motion is present. Skin is warm and dry without rash. Neurologic: Mental status is normal, cranial nerves are intact, there are no motor or sensory deficits.  ED Treatments / Results  Labs (all labs ordered are listed, but only abnormal results are displayed) Labs Reviewed  BASIC METABOLIC PANEL - Abnormal; Notable for the following  components:      Result Value   Calcium 8.5 (*)    All other components within normal limits  CBC WITH DIFFERENTIAL/PLATELET - Abnormal; Notable for the following components:   RBC 4.18 (*)    Hemoglobin 11.8 (*)    HCT 35.8 (*)    All other components within normal limits  ETHANOL  RAPID URINE DRUG SCREEN, HOSP PERFORMED    Procedures Procedures (including critical care time)  Medications Ordered in ED Medications  nicotine (NICODERM CQ - dosed in mg/24 hr) patch 7 mg (7 mg Transdermal Patch Applied 11/18/17 0141)  alum & mag hydroxide-simeth (MAALOX/MYLANTA) 200-200-20 MG/5ML suspension 30 mL (not administered)  ondansetron (ZOFRAN) tablet 4 mg (not administered)  acetaminophen (TYLENOL) tablet 650 mg (not administered)  benztropine (COGENTIN) tablet 0.5 mg (0.5 mg Oral Given 11/18/17 0141)  FLUoxetine (PROZAC) capsule 20 mg (not administered)  risperiDONE (RISPERDAL) tablet 1 mg (1 mg Oral Given 11/18/17 0141)     Initial Impression / Assessment and Plan / ED Course  I have reviewed the triage vital signs and the nursing notes.  Pertinent lab results that were available during my care of the patient were reviewed by me and considered in my medical decision making (see chart for details).  History of schizophrenia with current auditory hallucinations.  Screening labs will be obtained and TTS consultation requested.  Old records are reviewed confirming multiple ED visits and occasional hospitalizations for schizophrenia and auditory hallucinations.  TTS consultation appreciated.  Patient will be held overnight for psychiatric evaluation in the morning.  Final Clinical Impressions(s) / ED Diagnoses   Final diagnoses:  Auditory hallucinations  Normochromic normocytic anemia    ED Discharge Orders    None       Dione BoozeGlick, Tetsuo Coppola, MD 11/18/17 [phone]

## 2017-11-18 NOTE — ED Notes (Signed)
Report received on pt-- pt placed in B hallway-- pt is dressed in regular clothes, has shirt/pants/socks on.

## 2017-11-18 NOTE — ED Provider Notes (Signed)
  Physical Exam  BP 126/78 (BP Location: Right Arm)   Pulse 70   Temp 98.1 F (36.7 C) (Oral)   Resp 16   Ht 5\' 10"  (1.778 m)   SpO2 99%   BMI 31.57 kg/m   Physical Exam  ED Course/Procedures     Procedures  MDM  Seen by psych and cleared for DC       Benjiman CorePickering, Kaidyn Hernandes, MD 11/18/17 1411

## 2017-11-18 NOTE — ED Notes (Signed)
Behavioral health notified nurse that it is being recommended that pt be observed and revaluated in the am by psychiatrist.

## 2017-11-18 NOTE — BH Assessment (Signed)
Tele Assessment Note   Patient Name: Cole Barrett MRN: [id number] Referring Physician: Dr. Dione Boozeavid Glick, MD [facility] Location of Provider: Behavioral Health TTS Department  Cole Barrett is an 31 y.o. male [facility] after requesting to brought due to hearing voices.  Pt stated "I was watching TV and the voices starting getting too loud, I just want the voices to stop".  Pt reports that the voices are saying "we're going to kill you." According to pt's chart, he has had 15 psychiatric ED visits this year for a similar presentations.  Pt denies SI/ HI.  Pt denies engaging in self injurious behaviors and denies having access to weapons.  Pt admits to "drinking alcohol a couple of times a week" and "smoke 1 blunt 3-4 times per month."  Pt denies any other substance use.  Pt able to contract for safety.  Pt appear to be responding to internal stimuli.  Pt lives at home alone and reports being able to return at discharge.  Pt denies having familial or any other support system.  Pt denies having community resources for medication management or outpatient treatment.   Patient was wearing sweats and appeared appropriately groomed.  Pt was alert throughout the assessment.  Patient made poor eye contact and had normal psychomotor activity.  Patient spoke in a normal voice without pressured speech.  Pt expressed feeling anxious about hearing voices.  Pt's affect appeared dysphoric/ irritable and congruent with stated mood. Pt's thought process was coherent and logical.  Pt presented with partial insight and judgement.  Pt appeared to be responding to internal stimuli.  Disposition: Discussed case with [facility] provider, Nira ConnJason Berry, NP who recommends observation for stability and safety with re-evaluation by psychiatry in the AM.  LPCA informed [facility] provider, Dr Blinda LeatherwoodPollina and pt's nurse, Alphonzo LemmingsWhitney of the recommended  disposition.  Diagnosis: Schizophrenia  Past Medical History:  Past Medical History:  Diagnosis Date  . Homelessness   . Hypercholesterolemia   . Mental disorder   . Paranoid behavior (HCC)   . PE (pulmonary embolism)   . Schizophrenia (HCC)     History reviewed. No pertinent surgical history.  Family History:  Family History  Problem Relation Age of Onset  . Hypertension Mother     Social History:  reports that he has been smoking cigarettes.  He has been smoking about 0.50 packs per day. he has never used smokeless tobacco. He reports that he drinks alcohol. He reports that he uses drugs. Drug: Marijuana.  Additional Social History:  Alcohol / Drug Use Pain Medications: See MARs Prescriptions: See MARs Over the Counter: See MARs History of alcohol / drug use?: Yes Longest period of sobriety (when/how long): a couple of months Negative Consequences of Use: Personal relationships Substance #1 Name of Substance 1: Alcohol 1 - Age of First Use: unknown 1 - Amount (size/oz): per pt 1 40oz of beer 1 - Frequency: per patient "a couple of days a week" 1 - Duration: unknown 1 - Last Use / Amount: 11/17/17 Substance #2 Name of Substance 2: Marijuana 2 - Age of First Use: 31 y/o 2 - Amount (size/oz): per pt "I smoke 1 blunt" 2 - Frequency: per pt "3-4 times per month" 2 - Duration: unknown 2 - Last Use / Amount: 10/19/17  CIWA: CIWA-Ar BP: 120/74 Pulse Rate: 73 COWS:    PATIENT STRENGTHS: (choose at least two) Average or above average intelligence  Capable of independent living Communication skills Financial means  Allergies:  Allergies  Allergen Reactions  . Haldol [Haloperidol] Shortness Of Breath    Home Medications:  (Not in a hospital admission)  OB/GYN Status:  No LMP for male patient.  General Assessment Data TTS Assessment: In system Is this a Tele or Face-to-Face Assessment?: Tele Assessment Is this an Initial Assessment or a Re-assessment for  this encounter?: Initial Assessment Marital status: Single Is patient pregnant?: No Pregnancy Status: No Living Arrangements: Alone Can pt return to current living arrangement?: Yes Admission Status: Voluntary Is patient capable of signing voluntary admission?: Yes Referral Source: Self/Family/Friend Insurance type: Self Pay     Crisis Care Plan Living Arrangements: Alone Legal Guardian: Other:(Self) Name of Psychiatrist: NA Name of Therapist: NA  Education Status Is patient currently in school?: No Highest grade of school patient has completed: 12th grade. Name of school: NA  Risk to self with the past 6 months Suicidal Ideation: No Has patient been a risk to self within the past 6 months prior to admission? : No Has patient had any suicidal intent within the past 6 months prior to admission? : No Is patient at risk for suicide?: No Suicidal Plan?: No Has patient had any suicidal plan within the past 6 months prior to admission? : No Access to Means: No(Denies access to guns and other weapons) What has been your use of drugs/alcohol within the last 12 months?: Alcohol and marijuana Previous Attempts/Gestures: No How many times?: 0 Other Self Harm Risks: none reported Triggers for Past Attempts: None known Intentional Self Injurious Behavior: None Family Suicide History: No Recent stressful life event(s): (None reported) Persecutory voices/beliefs?: Yes Depression: No(Pt currently denies depression) Depression Symptoms: Tearfulness, Isolating Substance abuse history and/or treatment for substance abuse?: Yes Suicide prevention information given to non-admitted patients: Not applicable  Risk to Others within the past 6 months Homicidal Ideation: No Does patient have any lifetime risk of violence toward others beyond the six months prior to admission? : No Thoughts of Harm to Others: No Current Homicidal Intent: No Current Homicidal Plan: No Access to Homicidal  Means: No History of harm to others?: No Assessment of Violence: None Noted Violent Behavior Description: NA Does patient have access to weapons?: No Criminal Charges Pending?: Yes Describe Pending Criminal Charges: [place] Does patient have a court date: Yes Court Date: (unknown) Is patient on probation?: No  Psychosis Hallucinations: Auditory, With command Delusions: None noted  Mental Status Report Appearance/Hygiene: Unremarkable Eye Contact: Poor Motor Activity: Freedom of movement Speech: Logical/coherent Level of Consciousness: Quiet/awake Mood: Anxious, Suspicious, Fearful, Helpless(continue to state he needed help for the voices) Affect: Flat Anxiety Level: Moderate Thought Processes: Relevant, Coherent Judgement: Partial Orientation: Person, Place, Appropriate for developmental age Obsessive Compulsive Thoughts/Behaviors: None  Cognitive Functioning Concentration: Normal Memory: Recent Intact, Remote Intact IQ: Average Insight: Fair Impulse Control: Fair Appetite: Good Sleep: No Change Total Hours of Sleep: 8 Vegetative Symptoms: None  ADLScreening [facility] Assessment Services) Patient's cognitive ability adequate to safely complete daily activities?: Yes Patient able to express need for assistance with ADLs?: Yes Independently performs ADLs?: Yes (appropriate for developmental age)  Prior Inpatient Therapy Prior Inpatient Therapy: Yes Prior Therapy Dates: Per chart, 2017. and Oct 2018 Prior Therapy Facilty/Provider(s): Per chart, [facility]  Reason for Treatment: alcohol treatment and Schizophrenia.  Prior Outpatient Therapy Prior Outpatient Therapy: No Prior Therapy Dates: NA Prior Therapy Facilty/Provider(s): NA Reason for Treatment: NA Does patient have an ACCT team?: No Does patient  have Intensive In-House Services?  : No Does patient have [facility] services? : No Does patient have P4CC services?: No  ADL Screening (condition at time of  admission) Patient's cognitive ability adequate to safely complete daily activities?: Yes Is the patient deaf or have difficulty hearing?: No Does the patient have difficulty seeing, even when wearing glasses/contacts?: No Does the patient have difficulty concentrating, remembering, or making decisions?: No Patient able to express need for assistance with ADLs?: Yes Does the patient have difficulty dressing or bathing?: No Independently performs ADLs?: Yes (appropriate for developmental age) Does the patient have difficulty walking or climbing stairs?: No Weakness of Legs: None Weakness of Arms/Hands: None  Home Assistive Devices/Equipment Home Assistive Devices/Equipment: None    Abuse/Neglect Assessment (Assessment to be complete while patient is alone) Abuse/Neglect Assessment Can Be Completed: Yes Physical Abuse: Denies Verbal Abuse: Denies Sexual Abuse: Denies Exploitation of patient/patient's resources: Denies Self-Neglect: Denies Values / Beliefs Cultural Requests During Hospitalization: None Spiritual Requests During Hospitalization: None   Advance Directives (For Healthcare) Does Patient Have a Medical Advance Directive?: No Would patient like information on creating a medical advance directive?: No - Patient declined    Additional Information 1:1 In Past 12 Months?: No CIRT Risk: No Elopement Risk: No Does patient have medical clearance?: Yes     Disposition: [facility], Nira ConnJason Berry, NP who recommends observation for stability and safety with re-evaluation by psychiatry in the AM.  LPCA informed [facility] provider, Dr Blinda LeatherwoodPollina and pt's nurse, Alphonzo LemmingsWhitney of the recommended disposition.  Disposition Initial Assessment Completed for this Encounter: Yes Disposition of Patient: Re-evaluation by Psychiatry recommended(Per Nira ConnJason Berry, FNP)  This service was provided via telemedicine using a 2-way, interactive audio and video technology.  Names of  all persons participating in this telemedicine service and their role in this encounter. Name: Cole Barrett Role: patient  Name: Tayler Lassen, MS, LPCA, [place] Role: TTS          Carollyn Etcheverry L Kong Packett, MS, LPCA, NCC 11/18/2017 3:38 AM

## 2017-11-18 NOTE — Consult Note (Signed)
Telepsych Consultation   Reason for Consult: [place] Referring Physician: Dr. Delora Fuel, MD Location of Patient: [facility] ED Location of Provider: [facility]  Patient Identification: Mason Dibiasio MRN:  [id number] Principal Diagnosis: <principal problem not specified> Diagnosis:   Patient Active Problem List   Diagnosis Date Noted  . MDD (major depressive disorder) [F32.9] 09/22/2017  . Alcohol abuse [F10.10]   . Auditory hallucinations [R44.0]   . Alcohol abuse with alcohol-induced mood disorder ([place]) [F10.14] 09/08/2016  . Adjustment disorder with depressed mood [F43.21] 09/07/2016  . Homelessness [Z59.0] 09/03/2016  . Person feigning illness [Z76.5] 09/03/2016  . 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 Cannabis use disorder, mild, abuse [F12.10] 08/29/2016  . Pulmonary emboli ([place]) [I26.99] 07/13/2016  . Pulmonary embolism and infarction ([place]) [I26.99] 01/02/2014  . Acute left flank pain [R10.9] 01/02/2014  . Pulmonary embolism, bilateral ([place]) [I26.99] 01/02/2014    Total Time spent with patient: 30 minutes  Subjective:   Kahne Helfand is a 31 y.o. male patient admitted with Schizophrenia.  HPI: Per the TTS assessment completed on 11/18/17 by Lauretta Grill. Kahlel Peake is an 31 y.o. male who was brought to [facility] by [employer] after requesting to brought due to hearing voices.  Pt stated "I was watching TV and the voices starting getting too loud, I just want the voices to stop".  Pt reports that the voices are saying "we're going to kill you." According to pt's chart, he has had 15 psychiatric ED visits this year for a similar presentations.  Pt denies SI/ HI.  Pt denies engaging in self injurious behaviors and denies having access to weapons.  Pt admits to "drinking alcohol a couple of times a week" and "smoke 1 blunt 3-4 times per month."  Pt denies any other substance use.  Pt able to contract for safety.  Pt appear to be  responding to internal stimuli.  Pt lives at home alone and reports being able to return at discharge.  Pt denies having familial or any other support system.  Pt denies having community resources for medication management or outpatient treatment.   Patient was wearing sweats and appeared appropriately groomed.  Pt was alert throughout the assessment.  Patient made poor eye contact and had normal psychomotor activity.  Patient spoke in a normal voice without pressured speech.  Pt expressed feeling anxious about hearing voices.  Pt's affect appeared dysphoric/ irritable and congruent with stated mood. Pt's thought process was coherent and logical.  Pt presented with partial insight and judgement.  Pt appeared to be responding to internal stimuli.  On Exam: Patient was seen via tele-psych, chart reviewed with treatment team. Patient in bed, awake, alert and oriented x4. Patient reiterated the reason for this hospital admission as documented above. Patient stated, "I came to the hospital because the voices got bad last night. The voices are saying that they going to kill me". Patient stated that he is doing better today and the voices are better. He slept good last night. Patient reports he hasn't followed up with his OP provider in a while and he is low on his medications and will need refill soon. He agrees follow up with [facility] in order to get his refills through them. He denies any suicides/homicide ideations as well as visual hallucinations.   Past Psychiatric History: As in H&P  Risk to Self: Suicidal Ideation: No Is patient at risk for suicide?: No Suicidal Plan?: No Access to Means: No(Denies access to guns  and other weapons) What has been your use of drugs/alcohol within the last 12 months?: Alcohol and marijuana How many times?: 0 Other Self Harm Risks: none reported Triggers for Past Attempts: None known Intentional Self Injurious Behavior: None Risk to Others: Homicidal Ideation:  No Thoughts of Harm to Others: No Current Homicidal Intent: No Current Homicidal Plan: No Access to Homicidal Means: No History of harm to others?: No Assessment of Violence: None Noted Violent Behavior Description: NA Does patient have access to weapons?: No Criminal Charges Pending?: Yes Describe Pending Criminal Charges: [place] [employer]ons Does patient have a court date: Yes Court Date: (unknown) Prior Inpatient Therapy: Prior Inpatient Therapy: Yes Prior Therapy Dates: Per chart, 2017. and Oct 2018 Prior Therapy Facilty/Provider(s): Per chart, [facility]  Reason for Treatment: alcohol treatment and Schizophrenia. Prior Outpatient Therapy: Prior Outpatient Therapy: No Prior Therapy Dates: NA Prior Therapy Facilty/Provider(s): NA Reason for Treatment: NA Does patient have an ACCT team?: No Does patient have Intensive In-House Services?  : No Does patient have [facility] services? : No Does patient have P4CC services?: No  Past Medical History:  Past Medical History:  Diagnosis Date  . Homelessness   . Hypercholesterolemia   . Mental disorder   . Paranoid behavior ([place])   . PE (pulmonary embolism)   . Schizophrenia ([place])    History reviewed. No pertinent surgical history. Family History:  Family History  Problem Relation Age of Onset  . Hypertension Mother    Family Psychiatric  History: Unknown  Social History:  Social History   Substance and Sexual Activity  Alcohol Use Yes   Comment: socially     Social History   Substance and Sexual Activity  Drug Use Yes  . Types: Marijuana   Comment: Once every 2-3 months, Last used: Last month    Social History   Socioeconomic History  . Marital status: Single    Spouse name: None  . Number of children: None  . Years of education: None  . Highest education level: None  Social Needs  . Financial resource strain: None  . Food insecurity - worry: None  . Food insecurity - inability: None  . Transportation needs -  medical: None  . Transportation needs - non-medical: None  Occupational History  . None  Tobacco Use  . Smoking status: Current Every Day Smoker    Packs/day: 0.50    Types: Cigarettes  . Smokeless tobacco: Never Used  Substance and Sexual Activity  . Alcohol use: Yes    Comment: socially  . Drug use: Yes    Types: Marijuana    Comment: Once every 2-3 months, Last used: Last month  . Sexual activity: No  Other Topics Concern  . None  Social History Narrative  . None   Additional Social History:    Allergies:   Allergies  Allergen Reactions  . Haldol [Haloperidol] Shortness Of Breath    Labs:  Results for orders placed or performed during the hospital encounter of 11/18/17 (from the past 48 hour(s))  Basic metabolic panel     Status: Abnormal   Collection Time: 11/18/17  1:49 AM  Result Value Ref Range   Sodium 138 135 - 145 mmol/L   Potassium 3.8 3.5 - 5.1 mmol/L   Chloride 106 101 - 111 mmol/L   CO2 24 22 - 32 mmol/L   Glucose, Bld 92 65 - 99 mg/dL   BUN 12 6 - 20 mg/dL   Creatinine, Ser 0.95 0.61 - 1.24 mg/dL  Calcium 8.5 (L) 8.9 - 10.3 mg/dL   GFR calc non Af Amer >60 >60 mL/min   GFR calc Af Amer >60 >60 mL/min    Comment: (NOTE) The eGFR has been calculated using the CKD EPI equation. This calculation has not been validated in all clinical situations. eGFR's persistently <60 mL/min signify possible Chronic Kidney Disease.    Anion gap 8 5 - 15  CBC with Differential     Status: Abnormal   Collection Time: 11/18/17  1:49 AM  Result Value Ref Range   WBC 4.8 4.0 - 10.5 K/uL   RBC 4.18 (L) 4.22 - 5.81 MIL/uL   Hemoglobin 11.8 (L) 13.0 - 17.0 g/dL   HCT 35.8 (L) 39.0 - 52.0 %   MCV 85.6 78.0 - 100.0 fL   MCH 28.2 26.0 - 34.0 pg   MCHC 33.0 30.0 - 36.0 g/dL   RDW 13.2 11.5 - 15.5 %   Platelets 269 150 - 400 K/uL   Neutrophils Relative % 46 %   Neutro Abs 2.2 1.7 - 7.7 K/uL   Lymphocytes Relative 41 %   Lymphs Abs 2.0 0.7 - 4.0 K/uL   Monocytes  Relative 9 %   Monocytes Absolute 0.4 0.1 - 1.0 K/uL   Eosinophils Relative 4 %   Eosinophils Absolute 0.2 0.0 - 0.7 K/uL   Basophils Relative 0 %   Basophils Absolute 0.0 0.0 - 0.1 K/uL  Ethanol     Status: None   Collection Time: 11/18/17  1:50 AM  Result Value Ref Range   Alcohol, Ethyl (B) <10 <10 mg/dL    Comment:        LOWEST DETECTABLE LIMIT FOR SERUM ALCOHOL IS 10 mg/dL FOR MEDICAL PURPOSES ONLY     Medications:  Current Facility-Administered Medications  Medication Dose Route Frequency Provider Last Rate Last Dose  . acetaminophen (TYLENOL) tablet 650 mg  650 mg Oral Y0D PRN Delora Fuel, MD      . alum & mag hydroxide-simeth (MAALOX/MYLANTA) 200-200-20 MG/5ML suspension 30 mL  30 mL Oral X8P PRN Delora Fuel, MD      . benztropine (COGENTIN) tablet 0.5 mg  0.5 mg Oral QHS Delora Fuel, MD   0.5 mg at 11/18/17 0141  . FLUoxetine (PROZAC) capsule 20 mg  20 mg Oral Daily Delora Fuel, MD      . nicotine (NICODERM CQ - dosed in mg/24 hr) patch 7 mg  7 mg Transdermal Daily Delora Fuel, MD   7 mg at 11/18/17 0141  . ondansetron (ZOFRAN) tablet 4 mg  4 mg Oral J8S PRN Delora Fuel, MD      . risperiDONE (RISPERDAL) tablet 1 mg  1 mg Oral QHS Delora Fuel, MD   1 mg at 11/18/17 0141   Current Outpatient Medications  Medication Sig Dispense Refill  . benztropine (COGENTIN) 0.5 MG tablet Take 1 tablet (0.5 mg total) by mouth at bedtime. For EPS 30 tablet 0  . FLUoxetine (PROZAC) 20 MG tablet Take 1 tablet (20 mg total) by mouth daily. For mood control (Patient not taking: Reported on 11/18/2017) 30 tablet 0  . risperiDONE (RISPERDAL) 1 MG tablet Take 1 tablet (1 mg total) by mouth at bedtime. For mood control (Patient not taking: Reported on 11/18/2017) 30 tablet 0    Musculoskeletal: UTA via camer  Psychiatric Specialty Exam: Physical Exam  Nursing note and vitals reviewed.   Review of Systems  Psychiatric/Behavioral: Positive for hallucinations. Negative for depression,  substance abuse and suicidal ideas. The  patient is not nervous/anxious and does not have insomnia.   All other systems reviewed and are negative.   Blood pressure 126/78, pulse 70, temperature 98.1 F (36.7 C), temperature source Oral, resp. rate 16, height '5\' 10"'$  (1.778 m), SpO2 99 %.Body mass index is 31.57 kg/m.  General Appearance: on hospital scrub  Eye Contact:  Good  Speech:  Clear and Coherent and Normal Rate  Volume:  Normal  Mood:  Euthymic  Affect:  Appropriate and Congruent  Thought Process:  Coherent and Goal Directed  Orientation:  Full (Time, Place, and Person)  Thought Content:  WDL and Logical  Suicidal Thoughts:  No  Homicidal Thoughts:  No  Memory:  Immediate;   Good Recent;   Good Remote;   Good  Judgement:  Intact  Insight:  Present  Psychomotor Activity:  Normal  Concentration:  Concentration: Good and Attention Span: Good  Recall:  Good  Fund of Knowledge:  Good  Language:  Good  Akathisia:  Negative  Handed:  Right  AIMS (if indicated):     Assets:  Communication Skills Desire for Improvement Physical Health Social Support  ADL's:  Intact  Cognition:  WNL  Sleep:        Treatment Plan Summary: Plan to discharge home with OP resources  Disposition: No evidence of imminent risk to self or others at present.   Patient does not meet criteria for psychiatric inpatient admission. Supportive therapy provided about ongoing stressors. Refer to IOP. Discussed crisis plan, support from social network, calling 911, coming to the Emergency Department, and calling Suicide Hotline.  This service was provided via telemedicine using a 2-way, interactive audio and video technology.  Names of all persons participating in this telemedicine service and their role in this encounter. Name: Rjay Revolorio Role: Patient  Name: Justina A. Okonkwo  Role: NP          Vicenta Aly, NP 11/18/2017 11:34 AM

## 2017-11-20 ENCOUNTER — Emergency Department (HOSPITAL_COMMUNITY)
Admission: EM | Admit: 2017-11-20 | Discharge: 2017-11-21 | Disposition: A | Discharge status: Other Acute Inpatient Hospital | Payer: Self-pay | Attending: Emergency Medicine | Admitting: Emergency Medicine

## 2017-11-20 ENCOUNTER — Other Ambulatory Visit: Payer: Self-pay

## 2017-11-20 ENCOUNTER — Encounter (HOSPITAL_COMMUNITY): Payer: Self-pay | Admitting: *Deleted

## 2017-11-20 DIAGNOSIS — F333 Major depressive disorder, recurrent, severe with psychotic symptoms: Secondary | ICD-10-CM | POA: Diagnosis present

## 2017-11-20 DIAGNOSIS — R440 Auditory hallucinations: Secondary | ICD-10-CM | POA: Insufficient documentation

## 2017-11-20 DIAGNOSIS — F22 Delusional disorders: Secondary | ICD-10-CM | POA: Insufficient documentation

## 2017-11-20 DIAGNOSIS — Z79899 Other long term (current) drug therapy: Secondary | ICD-10-CM | POA: Insufficient documentation

## 2017-11-20 DIAGNOSIS — R451 Restlessness and agitation: Secondary | ICD-10-CM | POA: Insufficient documentation

## 2017-11-20 DIAGNOSIS — Z9114 Patient's other noncompliance with medication regimen: Secondary | ICD-10-CM | POA: Insufficient documentation

## 2017-11-20 DIAGNOSIS — F17210 Nicotine dependence, cigarettes, uncomplicated: Secondary | ICD-10-CM | POA: Insufficient documentation

## 2017-11-20 DIAGNOSIS — F419 Anxiety disorder, unspecified: Secondary | ICD-10-CM | POA: Insufficient documentation

## 2017-11-20 DIAGNOSIS — Z046 Encounter for general psychiatric examination, requested by authority: Secondary | ICD-10-CM | POA: Insufficient documentation

## 2017-11-20 LAB — COMPREHENSIVE METABOLIC PANEL
ALK PHOS: 49 U/L (ref 38–126)
ALT: 31 U/L (ref 17–63)
AST: 23 U/L (ref 15–41)
Albumin: 3.8 g/dL (ref 3.5–5.0)
Anion gap: 9 (ref 5–15)
BILIRUBIN TOTAL: 0.7 mg/dL (ref 0.3–1.2)
BUN: 9 mg/dL (ref 6–20)
CALCIUM: 8.7 mg/dL — AB (ref 8.9–10.3)
CO2: 23 mmol/L (ref 22–32)
CREATININE: 0.98 mg/dL (ref 0.61–1.24)
Chloride: 104 mmol/L (ref 101–111)
GFR calc non Af Amer: >60 mL/min (ref >60)
Glucose, Bld: 87 mg/dL (ref 65–99)
Potassium: 3.5 mmol/L (ref 3.5–5.1)
SODIUM: 136 mmol/L (ref 135–145)
Total Protein: 7.1 g/dL (ref 6.5–8.1)

## 2017-11-20 LAB — RAPID URINE DRUG SCREEN, HOSP PERFORMED
Amphetamines: NOT DETECTED
BENZODIAZEPINES: NOT DETECTED
Barbiturates: NOT DETECTED
COCAINE: NOT DETECTED
OPIATES: NOT DETECTED
Tetrahydrocannabinol: NOT DETECTED

## 2017-11-20 LAB — CBC
HCT: 39.3 % (ref 39.0–52.0)
Hemoglobin: 12.9 g/dL — ABNORMAL LOW (ref 13.0–17.0)
MCH: 28.2 pg (ref 26.0–34.0)
MCHC: 32.8 g/dL (ref 30.0–36.0)
MCV: 85.8 fL (ref 78.0–100.0)
PLATELETS: 308 10*3/uL (ref 150–400)
RBC: 4.58 MIL/uL (ref 4.22–5.81)
RDW: 13.1 % (ref 11.5–15.5)
WBC: 5.4 10*3/uL (ref 4.0–10.5)

## 2017-11-20 LAB — ETHANOL: Alcohol, Ethyl (B): <10 mg/dL (ref <10)

## 2017-11-20 MED ORDER — ACETAMINOPHEN 325 MG PO TABS: 650.0000 mg | ORAL_TABLET | ORAL | Status: DC | PRN

## 2017-11-20 MED ORDER — ONDANSETRON HCL 4 MG PO TABS
4.0000 mg | ORAL_TABLET | Freq: Three times a day (TID) | ORAL | Status: DC | PRN
Start: 2017-11-20 — End: 2017-11-21

## 2017-11-20 NOTE — ED Provider Notes (Signed)
[name]   CSN: [id number] Arrival date & time: 11/20/17  1644     History   Chief Complaint Chief Complaint  Patient presents with  . Hallucinations    HPI Cole Barrett is a 31 y.o. male.  HPI Cole Barrett is a 31 y.o. male with history of homelessness, paranoid schizophrenia, mental disorder, presents to emergency department with complaint of hearing voices.  Patient was in the emergency department just 2 days ago, at that time cleared by TTS for discharge.  He [facility], however they were closed.  He states he cannot wait 2 days.  He states the voices are much louder and "driving me crazy."  Patient states he is afraid of going home, and requesting inpatient admission.  Denies any drug or alcohol use.  Past Medical History:  Diagnosis Date  . Homelessness   . Hypercholesterolemia   . Mental disorder   . Paranoid behavior ([facility])   . PE (pulmonary embolism)   . Schizophrenia [facility](HCC)     Patient Active Problem List   Diagnosis Date Noted  . MDD (major depressive disorder) 09/22/2017  . Alcohol abuse   . Auditory hallucinations   . Alcohol abuse with alcohol-induced mood disorder ([facility]) 09/08/2016  . Adjustment disorder with depressed mood 09/07/2016  . Homelessness 09/03/2016  . Person feigning illness 09/03/2016  . 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 Cannabis use disorder, mild, abuse 08/29/2016  . Pulmonary emboli ([facility]) 07/13/2016  . 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 Acute left flank pain 01/02/2014  . Pulmonary embolism, bilateral ([facility]) 01/02/2014    History reviewed. No pertinent surgical history.     Home Medications    Prior to Admission medications   Medication Sig Start Date End Date Taking? Authorizing Provider  benztropine (COGENTIN) 0.5 MG tablet Take 1 tablet (0.5 mg total) by mouth at  bedtime. For EPS 11/11/17   Charm RingsLord, Jamison Y, NP  FLUoxetine (PROZAC) 20 MG tablet Take 1 tablet (20 mg total) by mouth daily. For mood control Patient not taking: Reported on 11/18/2017 11/11/17   Charm RingsLord, Jamison Y, NP  risperiDONE (RISPERDAL) 1 MG tablet Take 1 tablet (1 mg total) by mouth at bedtime. For mood control Patient not taking: Reported on 11/18/2017 11/11/17   Charm RingsLord, Jamison Y, NP    Family History Family History  Problem Relation Age of Onset  . Hypertension Mother     Social History Social History   Tobacco Use  . Smoking status: Current Every Day Smoker    Packs/day: 0.50    Types: Cigarettes  . Smokeless tobacco: Never Used  Substance Use Topics  . Alcohol use: Yes    Comment: socially  . Drug use: Yes    Types: Marijuana    Comment: Once every 2-3 months, Last used: Last month     Allergies   Haldol [haloperidol]   Review of Systems Review of Systems  Constitutional: Negative for chills and fever.  Respiratory: Negative for cough, chest tightness and shortness of breath.   Cardiovascular: Negative for chest pain, palpitations and leg swelling.  Gastrointestinal: Negative for abdominal distention, abdominal pain, diarrhea, nausea and vomiting.  Musculoskeletal: Negative for arthralgias, myalgias, neck pain and neck stiffness.  Skin: Negative for rash.  Allergic/Immunologic: Negative for immunocompromised state.  Neurological: Negative for dizziness, weakness, light-headedness, numbness and headaches.  Psychiatric/Behavioral: Positive for agitation and hallucinations. The patient is  nervous/anxious.   All other systems reviewed and are negative.    Physical Exam Updated Vital Signs BP 127/85 (BP Location: Right Arm)   Pulse 76   Temp 98.6 F (37 C) (Oral)   Resp 17   SpO2 99%   Physical Exam  Constitutional: He appears well-developed and well-nourished. No distress.  HENT:  Head: Normocephalic and atraumatic.  Eyes: Conjunctivae are normal.    Neck: Neck supple.  Cardiovascular: Normal rate, regular rhythm and normal heart sounds.  Pulmonary/Chest: Effort normal. No respiratory distress. He has no wheezes. He has no rales.  Abdominal: Soft. Bowel sounds are normal. He exhibits no distension. There is no tenderness. There is no rebound.  Musculoskeletal: He exhibits no edema.  Neurological: He is alert.  Skin: Skin is warm and dry.  Psychiatric: His affect is angry. His speech is rapid and/or pressured. He is agitated. Thought content is paranoid and delusional.  Nursing note and vitals reviewed.    ED Treatments / Results  Labs (all labs ordered are listed, but only abnormal results are displayed) Labs Reviewed  COMPREHENSIVE METABOLIC PANEL - Abnormal; Notable for the following components:      Result Value   Calcium 8.7 (*)    All other components within normal limits  CBC - Abnormal; Notable for the following components:   Hemoglobin 12.9 (*)    All other components within normal limits  ETHANOL  RAPID URINE DRUG SCREEN, HOSP PERFORMED    EKG  EKG Interpretation None       Radiology No results found.  Procedures Procedures (including critical care time)  Medications Ordered in ED Medications  ondansetron (ZOFRAN) tablet 4 mg (not administered)  acetaminophen (TYLENOL) tablet 650 mg (not administered)     Initial Impression / Assessment and Plan / ED Course  I have reviewed the triage vital signs and the nursing notes.  Pertinent labs & imaging results that were available during my care of the patient were reviewed by me and considered in my medical decision making (see chart for details).     PT with schizophrenia, here with worsening hearing voices. Pt is agitated, screaming at me, complaining of worsening voices that he "cannot handle anymore."  Was just seen by TTS 2 days ago, cleared for discharge home.  He states he is feeling much worse at this time.  Medical clearance labs and rapid urine  drug screen obtained and are unremarkable.  I will consult TTS again for reevaluation.  TTS assessed pt, will observe in ED overnight and reasess in AM.   Final Clinical Impressions(s) / ED Diagnoses   Final diagnoses:  Auditory hallucination    ED Discharge Orders    None       Jaynie CrumbleKirichenko, Adler [place], PA-C 11/21/17 [id number]    Ward, Layla MawKristen N, DO 11/21/17 [phone]

## 2017-11-20 NOTE — ED Triage Notes (Signed)
Pt in c/o need for psych meds, pt seen here this week for auditory hallucinations and states, "I was discharged  [facility]. The voices came back and I came back here. They are just talking to me." Denies SI & HI

## 2017-11-21 ENCOUNTER — Encounter (HOSPITAL_COMMUNITY): Payer: Self-pay | Admitting: *Deleted

## 2017-11-21 ENCOUNTER — Other Ambulatory Visit: Payer: Self-pay

## 2017-11-21 ENCOUNTER — Inpatient Hospital Stay (HOSPITAL_COMMUNITY)
Admission: AD | Admit: 2017-11-21 | Discharge: 2017-11-26 | DRG: 885 | Disposition: A | Discharge status: Home / Self Care | Payer: Federal, State, Local not specified - Other | Source: Intra-hospital | Attending: Psychiatry | Admitting: Psychiatry

## 2017-11-21 DIAGNOSIS — G4700 Insomnia, unspecified: Secondary | ICD-10-CM | POA: Diagnosis present

## 2017-11-21 DIAGNOSIS — Z86711 Personal history of pulmonary embolism: Secondary | ICD-10-CM | POA: Diagnosis not present

## 2017-11-21 DIAGNOSIS — E7800 Pure hypercholesterolemia, unspecified: Secondary | ICD-10-CM | POA: Diagnosis present

## 2017-11-21 DIAGNOSIS — F419 Anxiety disorder, unspecified: Secondary | ICD-10-CM | POA: Diagnosis present

## 2017-11-21 DIAGNOSIS — F17210 Nicotine dependence, cigarettes, uncomplicated: Secondary | ICD-10-CM | POA: Diagnosis not present

## 2017-11-21 DIAGNOSIS — F1210 Cannabis abuse, uncomplicated: Secondary | ICD-10-CM | POA: Diagnosis not present

## 2017-11-21 DIAGNOSIS — F333 Major depressive disorder, recurrent, severe with psychotic symptoms: Secondary | ICD-10-CM | POA: Diagnosis present

## 2017-11-21 DIAGNOSIS — Z79899 Other long term (current) drug therapy: Secondary | ICD-10-CM | POA: Diagnosis not present

## 2017-11-21 DIAGNOSIS — Z590 Homelessness: Secondary | ICD-10-CM

## 2017-11-21 DIAGNOSIS — R440 Auditory hallucinations: Secondary | ICD-10-CM | POA: Diagnosis not present

## 2017-11-21 DIAGNOSIS — F200 Paranoid schizophrenia: Secondary | ICD-10-CM | POA: Diagnosis not present

## 2017-11-21 DIAGNOSIS — Z888 Allergy status to other drugs, medicaments and biological substances status: Secondary | ICD-10-CM

## 2017-11-21 DIAGNOSIS — F332 Major depressive disorder, recurrent severe without psychotic features: Secondary | ICD-10-CM | POA: Diagnosis not present

## 2017-11-21 MED ORDER — HYDROXYZINE HCL 25 MG PO TABS
25.0000 mg | ORAL_TABLET | Freq: Four times a day (QID) | ORAL | Status: DC | PRN
  Filled 2017-11-21: qty 10

## 2017-11-21 MED ORDER — FLUOXETINE HCL 20 MG PO CAPS
20.0000 mg | ORAL_CAPSULE | Freq: Every day | ORAL | Status: DC
  Administered 2017-11-21: 20 mg via ORAL
  Filled 2017-11-21: qty 1

## 2017-11-21 MED ORDER — RISPERIDONE 1 MG PO TABS
1.0000 mg | ORAL_TABLET | Freq: Once | ORAL | Status: AC
  Administered 2017-11-21: 1 mg via ORAL
  Filled 2017-11-21: qty 1

## 2017-11-21 MED ORDER — BENZTROPINE MESYLATE 1 MG PO TABS: 0.5000 mg | ORAL_TABLET | Freq: Every day | ORAL | Status: DC

## 2017-11-21 MED ORDER — BENZTROPINE MESYLATE 1 MG PO TABS
1.0000 mg | ORAL_TABLET | Freq: Every day | ORAL | Status: DC
  Filled 2017-11-21 (×3): qty 1

## 2017-11-21 MED ORDER — BENZTROPINE MESYLATE 1 MG PO TABS
1.0000 mg | ORAL_TABLET | Freq: Every day | ORAL | Status: DC
  Administered 2017-11-21: 1 mg via ORAL
  Filled 2017-11-21: qty 1

## 2017-11-21 MED ORDER — FLUOXETINE HCL 20 MG PO CAPS
20.0000 mg | ORAL_CAPSULE | Freq: Every day | ORAL | Status: DC
  Administered 2017-11-22 – 2017-11-26 (×5): 20 mg via ORAL
  Filled 2017-11-21 (×2): qty 1
  Filled 2017-11-21: qty 7
  Filled 2017-11-21 (×5): qty 1

## 2017-11-21 MED ORDER — RISPERIDONE 1 MG PO TABS
1.0000 mg | ORAL_TABLET | Freq: Every day | ORAL | Status: DC
  Administered 2017-11-21: 1 mg via ORAL
  Filled 2017-11-21: qty 1

## 2017-11-21 MED ORDER — MAGNESIUM HYDROXIDE 400 MG/5ML PO SUSP: 30.0000 mL | Freq: Every day | ORAL | Status: DC | PRN

## 2017-11-21 MED ORDER — ALUM & MAG HYDROXIDE-SIMETH 200-200-20 MG/5ML PO SUSP: 30.0000 mL | ORAL | Status: DC | PRN

## 2017-11-21 MED ORDER — RISPERIDONE 1 MG PO TABS
1.0000 mg | ORAL_TABLET | Freq: Every day | ORAL | Status: DC
  Filled 2017-11-21 (×3): qty 1

## 2017-11-21 MED ORDER — ACETAMINOPHEN 325 MG PO TABS: 650.0000 mg | ORAL_TABLET | Freq: Four times a day (QID) | ORAL | Status: DC | PRN

## 2017-11-21 MED ORDER — TRAZODONE HCL 50 MG PO TABS
50.0000 mg | ORAL_TABLET | Freq: Every evening | ORAL | Status: DC | PRN
  Filled 2017-11-21 (×3): qty 1

## 2017-11-21 NOTE — ED Notes (Signed)
Regular Diet Ordered for Cole LeeDinner. The Patient was given a [place]urkey Sandwich and [employer]ite for [employer].

## 2017-11-21 NOTE — ED Notes (Signed)
Belongings in locker 6 

## 2017-11-21 NOTE — BH Assessment (Addendum)
Tele Assessment Note   Patient Name: Cole Barrett MRN: [id number] Referring Physician: Lemont Fillers, [place] Location of Patient: [facility] Location of Provider: Behavioral Health TTS Department  Cordarrel Stiefel is an 31 y.o. male, who presents voluntary and unaccompanied to [facility]. This is the pt's nineteenth  psychiatric ED visit this year, for a similar presentation. Pt reported, "I was over here the other day or so but I was getting better. Pt reported, "I got some prescriptions from [facility] and took them to [facility] today but they where closed." Pt reported, he did not take them to another pharmacy because he can get them at [facility] for free. Pt reported, he came back to [facility] for some help. Pt reported, "I'm trying to get to a place so a doctor and nurses can watch over me and help me with these voices." Pt asked clinician, "are you gonna help me out?" Pt reported, "I been hearing these voices telling me to kill myself." Pt reported, it is a deep voice. Pt denies, SI, HI, self-injurious behaviors and access to weapons.   Pt denied abuse. Pt reported, smoking cigarettes here and there. Pt's UDS is negative. Pt denies, being linked to OPT resources (medication management and/or counseling.) Pt reported, he wants to get linked back to [place]. Pt reported, previous inpatient admissions.   Pt presents alert in scrubs with logical/coherent speech. Pt's eye contact was fair. Pt's mood was anxious/helpless. Pt's affect was congruent with mood. Pt's thought process was coherent/relevant. Pt's judgement was unimpaired. Pt's concentration, was normal. Pt's insight and impulse control are fair. Pt was oriented x4. Clinician asked the pt, "if discharged from [facility] could you contract for safety?" Pt replied, "I hope so." Pt reported, if inpatient treatment is recommended he would sign-in voluntarily.  Diagnosis: F20.9 Schizophrenia.  Past Medical History:  Past Medical History:  Diagnosis Date  .  Homelessness   . Hypercholesterolemia   . Mental disorder   . Paranoid behavior (HCC)   . PE (pulmonary embolism)   . Schizophrenia (HCC)     History reviewed. No pertinent surgical history.  Family History:  Family History  Problem Relation Age of Onset  . Hypertension Mother     Social History:  reports that he has been smoking cigarettes.  He has been smoking about 0.50 packs per day. he has never used smokeless tobacco. He reports that he drinks alcohol. He reports that he uses drugs. Drug: Marijuana.  Additional Social History:  Alcohol / Drug Use Pain Medications: See MAR Prescriptions: See MAR Over the Counter: See MAR History of alcohol / drug use?: Yes Substance #1 Name of Substance 1: Cigarettes. 1 - Age of First Use: UTA 1 - Amount (size/oz): Pt reported, smoking cigarettes here and there.  1 - Frequency: UTA 1 - Duration: UTA 1 - Last Use / Amount: UTA  CIWA: CIWA-Ar BP: 127/85 Pulse Rate: 76 COWS:    PATIENT STRENGTHS: (choose at least two) Average or above average intelligence General fund of knowledge  Allergies:  Allergies  Allergen Reactions  . Haldol [Haloperidol] Shortness Of Breath    Home Medications:  (Not in a hospital admission)  OB/GYN Status:  No LMP for male patient.  General Assessment Data Location of Assessment: [facility] ED TTS Assessment: In system Is this a Tele or Face-to-Face Assessment?: Face-to-Face Is this an Initial Assessment or a Re-assessment for this encounter?: Initial Assessment Marital status: Single Is patient pregnant?: No Pregnancy Status: No Living Arrangements: Alone Can pt return to current  living arrangement?: Yes Admission Status: Voluntary Is patient capable of signing voluntary admission?: Yes Referral Source: Self/Family/Friend Insurance type: Self-pay.     Crisis Care Plan Living Arrangements: Alone Legal Guardian: Other:(Self.) Name of Psychiatrist: NA Name of Therapist: NA  Education  Status Is patient currently in school?: No Current Grade: NA Highest grade of school patient has completed: 12th grade. Name of school: NA Contact person: NA  Risk to self with the past 6 months Suicidal Ideation: No(Pt denies. ) Has patient been a risk to self within the past 6 months prior to admission? : No Suicidal Intent: No Has patient had any suicidal intent within the past 6 months prior to admission? : No Is patient at risk for suicide?: No Suicidal Plan?: No Has patient had any suicidal plan within the past 6 months prior to admission? : No Access to Means: No What has been your use of drugs/alcohol within the last 12 months?: Cigarettes. Previous Attempts/Gestures: No How many times?: 0 Other Self Harm Risks: Pt denies.  Triggers for Past Attempts: None known Intentional Self Injurious Behavior: None(Pt denies. ) Family Suicide History: No Recent stressful life event(s): Other (Comment)(Hearing  voices. ) Persecutory voices/beliefs?: No Depression: No(Pt denies.) Depression Symptoms: (Pt denies. ) Substance abuse history and/or treatment for substance abuse?: No Suicide prevention information given to non-admitted patients: Not applicable  Risk to Others within the past 6 months Homicidal Ideation: No(Pt denies. ) Does patient have any lifetime risk of violence toward others beyond the six months prior to admission? : No Thoughts of Harm to Others: No Current Homicidal Intent: No Current Homicidal Plan: No Access to Homicidal Means: No Identified Victim: NA History of harm to others?: No Assessment of Violence: None Noted Does patient have access to weapons?: No Criminal Charges Pending?: Yes Describe Pending Criminal Charges: Misdemeanor(Pt did not disclose the charge. ) Does patient have a court date: Yes Court Date: (Pt reported, in February 2019.) Is patient on probation?: No  Psychosis Hallucinations: Auditory, With command Delusions: None  noted  Mental Status Report Appearance/Hygiene: In scrubs Eye Contact: Fair Motor Activity: Unremarkable Speech: Logical/coherent Level of Consciousness: Quiet/awake Mood: Anxious, Helpless Affect: Other (Comment)(congruent with mood. ) Anxiety Level: Moderate Thought Processes: Coherent, Relevant Judgement: Unimpaired Orientation: Person, Place, Time, Situation Obsessive Compulsive Thoughts/Behaviors: None  Cognitive Functioning Concentration: Normal Memory: Recent Intact IQ: Average Insight: Fair Impulse Control: Fair Appetite: Good Sleep: No Change Total Hours of Sleep: (Pt reported, 4, 5, or 8 hours. ) Vegetative Symptoms: None  ADLScreening [facility] Assessment Services) Patient's cognitive ability adequate to safely complete daily activities?: Yes Patient able to express need for assistance with ADLs?: No Independently performs ADLs?: Yes (appropriate for developmental age)  Prior Inpatient Therapy Prior Inpatient Therapy: Yes Prior Therapy Dates: Per chart, 2017. and Oct 2018 Prior Therapy Facilty/Provider(s): [facility]  Reason for Treatment: alcohol treatment and Schizophrenia.  Prior Outpatient Therapy Prior Outpatient Therapy: No Prior Therapy Dates: NA Prior Therapy Facilty/Provider(s): NA Reason for Treatment: NA Does patient have an ACCT team?: No Does patient have Intensive In-House Services?  : No Does patient have [facility] services? : No Does patient have P4CC services?: No  ADL Screening (condition at time of admission) Patient's cognitive ability adequate to safely complete daily activities?: Yes Is the patient deaf or have difficulty hearing?: No Does the patient have difficulty seeing, even when wearing glasses/contacts?: No Does the patient have difficulty concentrating, remembering, or making decisions?: Yes Patient able to express need for assistance  with ADLs?: No Does the patient have difficulty dressing or bathing?: No Independently  performs ADLs?: Yes (appropriate for developmental age) Weakness of Legs: None Weakness of Arms/Hands: None  Home Assistive Devices/Equipment Home Assistive Devices/Equipment: None    Abuse/Neglect Assessment (Assessment to be complete while patient is alone) Abuse/Neglect Assessment Can Be Completed: Yes Physical Abuse: Denies(Pt denies. ) Verbal Abuse: Denies(Pt denies. ) Sexual Abuse: Denies(Pt denies. ) Exploitation of patient/patient's resources: Denies(Pt denies. ) Self-Neglect: Denies(Pt denies. )     Advance Directives (For Healthcare) Does Patient Have a Medical Advance Directive?: No Would patient like information on creating a medical advance directive?: No - Patient declined    Additional Information 1:1 In Past 12 Months?: No CIRT Risk: No Elopement Risk: No Does patient have medical clearance?: Yes     Disposition: Donell SievertSpencer Simon, PA recommends overnight observation for safety and stabilization. Disposition discussed with Lemont Fillersatyana, PA and Jesse SansJanee', RN.    Disposition Initial Assessment Completed for this Encounter: Yes Disposition of Patient: Other dispositions( overnight observation for safety and stabilization.) Other disposition(s): Other (Comment)( overnight observation for safety and stabilization.)  This service was provided via telemedicine using a 2-way, interactive audio and video technology.  Names of all persons participating in this telemedicine service and their role in this encounter.               Redmond Pullingreylese D Darlena Koval 11/21/2017 1:29 AM   Redmond Pullingreylese D Rogue Pautler, MS, [facility], CRC Triage Specialist [phone]

## 2017-11-21 NOTE — ED Notes (Signed)
Pt taken by [facility] at this time.

## 2017-11-21 NOTE — ED Notes (Signed)
Spoke with  BH advised Overnight obs. Patient in room calm. No complaints at this time.

## 2017-11-21 NOTE — ED Notes (Signed)
Meal tray at bedside.  

## 2017-11-21 NOTE — Consult Note (Signed)
Telepsych Consultation   Reason for Consult:  Auditory hallucinations, noncompliance Referring Physician:  EDP Location of Patient: [facility] Location of Provider: Other: [facility]  Patient Identification: Cole Barrett MRN:  [id number] Principal Diagnosis: MDD (major depressive disorder), recurrent, severe, with psychosis ([place]) Diagnosis:   Patient Active Problem List   Diagnosis Date Noted  . MDD (major depressive disorder), recurrent, severe, with psychosis ([place]) [F33.3] 11/21/2017    Priority: High  . MDD (major depressive disorder) [F32.9] 09/22/2017  . Alcohol abuse [F10.10]   . Auditory hallucinations [R44.0]   . Alcohol abuse with alcohol-induced mood disorder ([place]) [F10.14] 09/08/2016  . Adjustment disorder with depressed mood [F43.21] 09/07/2016  . Homelessness [Z59.0] 09/03/2016  . Person feigning illness [Z76.5] 09/03/2016  . 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 Cannabis use disorder, mild, abuse [F12.10] 08/29/2016  . Pulmonary emboli ([place]) [I26.99] 07/13/2016  . Pulmonary embolism and infarction ([place]) [I26.99] 01/02/2014  . Acute left flank pain [R10.9] 01/02/2014  . Pulmonary embolism, bilateral ([place]) [I26.99] 01/02/2014    Total Time spent with patient: 30 minutes  Subjective:   Cole Barrett is a 31 y.o. male patient admitted with reports of auditory hallucinations intermittently occurring for the past couple years. Pt has not been abusing drugs during this time. Unfortunately, pt has had poor compliance with his follow-ups due to financial difficulties. Pt left the ED a few days ago to to go [place] but had trouble getting there and then they were closed when he went to get his free medication. Today, pt seen and chart reviewed. Pt is alert/oriented x4, calm, cooperative, and appropriate to situation. Pt denies suicidal/homicidal ideation yet reports severe auditory hallucinations that are intrusive. Pt reports that the Risperidone  was helping him greatly and that he wanted to continue this medication. Pt has good insight, has not been abusing drugs, has a stable job Regulatory affairs officer), and a place to live; minimal potential for secondary gain. Given the pt's severe auditory hallucinations, he would benefit from inpatient admission. Chart review shows this has not been offered to him before and he would likely benefit greatly from medication management while being closely-monitored.   HPI:  I have reviewed and concur with HPI elements below, modified as follows:  "Cole Barrett is an 31 y.o. male, who presents voluntary and unaccompanied to [facility]. This is the pt's nineteenth  psychiatric ED visit this year,for a similar presentation. Pt reported, "I was over here the other day or so but I was getting better. Pt reported, "I got some prescriptions from [facility] and took them to [facility] today but they where closed." Pt reported, he did not take them to another pharmacy because he can get them at [facility] for free. Pt reported, he came back to [facility] for some help. Pt reported, "I'm trying to get to a place so a doctor and nurses can watch over me and help me with these voices." Pt asked clinician, "are you gonna help me out?" Pt reported, "I been hearing these voices telling me to kill myself." Pt reported, it is a deep voice. Pt denies, SI, HI, self-injurious behaviors and access to weapons. "  Pt has been cooperative in the ED but with continuous complaint of severe auditory hallucinations.    Past Psychiatric History: auditory hallucinations  Risk to Self: Suicidal Ideation: No(Pt denies. ) Suicidal Intent: No Is patient at risk for suicide?: No Suicidal Plan?: No Access to Means: No What has been your use of drugs/alcohol within the  last 12 months?: Cigarettes. How many times?: 0 Other Self Harm Risks: Pt denies.  Triggers for Past Attempts: None known Intentional Self Injurious Behavior: None(Pt denies. ) Risk to Others:  Homicidal Ideation: No(Pt denies. ) Thoughts of Harm to Others: No Current Homicidal Intent: No Current Homicidal Plan: No Access to Homicidal Means: No Identified Victim: NA History of harm to others?: No Assessment of Violence: None Noted Does patient have access to weapons?: No Criminal Charges Pending?: Yes Describe Pending Criminal Charges: Misdemeanor(Pt did not disclose the charge. ) Does patient have a court date: Yes Court Date: (Pt reported, in February 2019.) Prior Inpatient Therapy: Prior Inpatient Therapy: Yes Prior Therapy Dates: Per chart, 2017. and Oct 2018 Prior Therapy Facilty/Provider(s): Per chart, [facility]  Reason for Treatment: alcohol treatment and Schizophrenia. Prior Outpatient Therapy: Prior Outpatient Therapy: No Prior Therapy Dates: NA Prior Therapy Facilty/Provider(s): NA Reason for Treatment: NA Does patient have an ACCT team?: No Does patient have Intensive In-House Services?  : No Does patient have [facility] services? : No Does patient have P4CC services?: No  Past Medical History:  Past Medical History:  Diagnosis Date  . Homelessness   . Hypercholesterolemia   . Mental disorder   . Paranoid behavior ([place])   . PE (pulmonary embolism)   . Schizophrenia ([place])    History reviewed. No pertinent surgical history. Family History:  Family History  Problem Relation Age of Onset  . Hypertension Mother    Family Psychiatric  History: depression Social History:  Social History   Substance and Sexual Activity  Alcohol Use Yes   Comment: socially     Social History   Substance and Sexual Activity  Drug Use Yes  . Types: Marijuana   Comment: Once every 2-3 months, Last used: Last month    Social History   Socioeconomic History  . Marital status: Single    Spouse name: None  . Number of children: None  . Years of education: None  . Highest education level: None  Social Needs  . Financial resource strain: None  . Food insecurity -  worry: None  . Food insecurity - inability: None  . Transportation needs - medical: None  . Transportation needs - non-medical: None  Occupational History  . None  Tobacco Use  . Smoking status: Current Every Day Smoker    Packs/day: 0.50    Types: Cigarettes  . Smokeless tobacco: Never Used  Substance and Sexual Activity  . Alcohol use: Yes    Comment: socially  . Drug use: Yes    Types: Marijuana    Comment: Once every 2-3 months, Last used: Last month  . Sexual activity: No  Other Topics Concern  . None  Social History Narrative  . None   Additional Social History:    Allergies:   Allergies  Allergen Reactions  . Haldol [Haloperidol] Shortness Of Breath    Labs:  Results for orders placed or performed during the hospital encounter of 11/20/17 (from the past 48 hour(s))  Comprehensive metabolic panel     Status: Abnormal   Collection Time: 11/20/17  5:27 PM  Result Value Ref Range   Sodium 136 135 - 145 mmol/L   Potassium 3.5 3.5 - 5.1 mmol/L   Chloride 104 101 - 111 mmol/L   CO2 23 22 - 32 mmol/L   Glucose, Bld 87 65 - 99 mg/dL   BUN 9 6 - 20 mg/dL   Creatinine, Ser 0.98 0.61 - 1.24 mg/dL   Calcium  8.7 (L) 8.9 - 10.3 mg/dL   Total Protein 7.1 6.5 - 8.1 g/dL   Albumin 3.8 3.5 - 5.0 g/dL   AST 23 15 - 41 U/L   ALT 31 17 - 63 U/L   Alkaline Phosphatase 49 38 - 126 U/L   Total Bilirubin 0.7 0.3 - 1.2 mg/dL   GFR calc non Af Amer >60 >60 mL/min   GFR calc Af Amer >60 >60 mL/min    Comment: (NOTE) The eGFR has been calculated using the CKD EPI equation. This calculation has not been validated in all clinical situations. eGFR's persistently <60 mL/min signify possible Chronic Kidney Disease.    Anion gap 9 5 - 15  Ethanol     Status: None   Collection Time: 11/20/17  5:27 PM  Result Value Ref Range   Alcohol, Ethyl (B) <10 <10 mg/dL    Comment:        LOWEST DETECTABLE LIMIT FOR SERUM ALCOHOL IS 10 mg/dL FOR MEDICAL PURPOSES ONLY   cbc     Status:  Abnormal   Collection Time: 11/20/17  5:27 PM  Result Value Ref Range   WBC 5.4 4.0 - 10.5 K/uL   RBC 4.58 4.22 - 5.81 MIL/uL   Hemoglobin 12.9 (L) 13.0 - 17.0 g/dL   HCT 39.3 39.0 - 52.0 %   MCV 85.8 78.0 - 100.0 fL   MCH 28.2 26.0 - 34.0 pg   MCHC 32.8 30.0 - 36.0 g/dL   RDW 13.1 11.5 - 15.5 %   Platelets 308 150 - 400 K/uL  Rapid urine drug screen (hospital performed)     Status: None   Collection Time: 11/20/17  5:49 PM  Result Value Ref Range   Opiates NONE DETECTED NONE DETECTED   Cocaine NONE DETECTED NONE DETECTED   Benzodiazepines NONE DETECTED NONE DETECTED   Amphetamines NONE DETECTED NONE DETECTED   Tetrahydrocannabinol NONE DETECTED NONE DETECTED   Barbiturates NONE DETECTED NONE DETECTED    Comment: (NOTE) DRUG SCREEN FOR MEDICAL PURPOSES ONLY.  IF CONFIRMATION IS NEEDED FOR ANY PURPOSE, NOTIFY LAB WITHIN 5 DAYS. LOWEST DETECTABLE LIMITS FOR URINE DRUG SCREEN Drug Class                     Cutoff (ng/mL) Amphetamine and metabolites    1000 Barbiturate and metabolites    200 Benzodiazepine                 762 Tricyclics and metabolites     300 Opiates and metabolites        300 Cocaine and metabolites        300 THC                            50     Medications:  Current Facility-Administered Medications  Medication Dose Route Frequency Provider Last Rate Last Dose  . acetaminophen (TYLENOL) tablet 650 mg  650 mg Oral Q4H PRN Kirichenko, Tatyana, PA-C      . ondansetron (ZOFRAN) tablet 4 mg  4 mg Oral Q8H PRN Jeannett Senior, PA-C       Current Outpatient Medications  Medication Sig Dispense Refill  . benztropine (COGENTIN) 0.5 MG tablet Take 1 tablet (0.5 mg total) by mouth at bedtime. For EPS 30 tablet 0  . FLUoxetine (PROZAC) 20 MG tablet Take 1 tablet (20 mg total) by mouth daily. For mood control (Patient not taking: Reported on 11/18/2017) 30 tablet  0  . risperiDONE (RISPERDAL) 1 MG tablet Take 1 tablet (1 mg total) by mouth at bedtime. For mood  control (Patient not taking: Reported on 11/18/2017) 30 tablet 0    Musculoskeletal: Strength & Muscle Tone: within normal limits Gait & Station: normal Patient leans: N/A  Psychiatric Specialty Exam: Physical Exam  Review of Systems  Psychiatric/Behavioral: Positive for depression and hallucinations. Negative for substance abuse and suicidal ideas. The patient is nervous/anxious and has insomnia.   All other systems reviewed and are negative.   Blood pressure 115/67, pulse 80, temperature 98.6 F (37 C), temperature source Oral, resp. rate 18, SpO2 99 %.There is no height or weight on file to calculate BMI.  General Appearance: Bizarre and Fairly Groomed  Eye Contact:  Good  Speech:  Clear and Coherent and Normal Rate  Volume:  Normal  Mood:  Anxious  Affect:  Appropriate, Congruent and Depressed  Thought Process:  Coherent, Goal Directed, Linear and Descriptions of Associations: Tangential  Orientation:  Full (Time, Place, and Person)  Thought Content:  Focused on medications  Suicidal Thoughts:  No  Homicidal Thoughts:  No  Memory:  Immediate;   Fair Recent;   Fair Remote;   Fair  Judgement:  Fair  Insight:  Fair  Psychomotor Activity:  WNL  Concentration:  Concentration: Fair and Attention Span: Fair  Recall:  [employer] of Knowledge:  Fair  Language:  Fair  Akathisia:  No  Handed:    AIMS (if indicated):     Assets:  Communication Skills Desire for Improvement Resilience  ADL's:  Intact  Cognition:  WNL  Sleep:        Treatment Plan Summary: MDD (major depressive disorder), recurrent, severe, with psychosis ([place]) unstable, warrants inpatient admission  Disposition: Recommend psychiatric Inpatient admission when medically cleared.  Medications: -Give Risperidone M-tab 108m po now -Restart home: Risperidone 168mpo qhs -Cogentin 42m38mo qhs -Prozac 68m66m daily -Trazodone 50mg62mqhs prn insomnia  This service was provided via telemedicine using a 2-way,  interactive audio and video technology.  Names of all persons participating in this telemedicine service and their role in this encounter. Name: John Guadelupe Sabin, FNP-BC Role: NP Psych  Name: FernaNeita GarnetRole: Psychiatrist  Name: ChrisLauretta Grill: Patient  Name:  Role:     WithrBenjamine Mola 1[place]5/2018 11:23 AM  Agree with NP assessment

## 2017-11-21 NOTE — Progress Notes (Addendum)
[facility] Bed 2  Verlin Festeronrad Wthrow, NP is the accepting provider.  Dr. Jama Flavorsobos is the attending provider.  Call report to [phone]   [facility]egan [email or web address] notified. Will notify pt's nurse, chris. Pt is Voluntary.  Pt may be transported by [facility]  [facility]  [facility].  Carney BernJean T. Kaylyn LimSutter, MSW, LCSWA Disposition Clinical Social Work [phone] (cell) [phone] (office)

## 2017-11-21 NOTE — ED Notes (Signed)
Pt given sprite per Thayer Ohmhris (RN). Pt resting in bed at this time.

## 2017-11-21 NOTE — ED Notes (Signed)
Pt ambulatory to bathroom at this time; meal given

## 2017-11-21 NOTE — ED Notes (Signed)
Regular diet breakfast tray ordered @ 0836 w/ 'no sharps' requested.  

## 2017-11-21 NOTE — ED Notes (Signed)
Pt ambulated to the restroom. And back to his room

## 2017-11-21 NOTE — ED Provider Notes (Signed)
Patient care [facility] PA-C pending TTS reevaluation this morning and recommendations.  Presented with hallucinations and noncompliance with medications. Labs unremarkable. Pending TTS recommendations. TTS recommended inpatient placement.   [place]iana ShoreMitchell, Anden Bartolo B, PA-C 11/21/17 1605    Loren RacerYelverton, David, MD 11/23/17 1124

## 2017-11-22 DIAGNOSIS — R440 Auditory hallucinations: Secondary | ICD-10-CM

## 2017-11-22 DIAGNOSIS — F17210 Nicotine dependence, cigarettes, uncomplicated: Secondary | ICD-10-CM

## 2017-11-22 DIAGNOSIS — F200 Paranoid schizophrenia: Secondary | ICD-10-CM

## 2017-11-22 MED ORDER — TRAZODONE HCL 50 MG PO TABS
50.0000 mg | ORAL_TABLET | Freq: Every evening | ORAL | Status: DC | PRN
  Administered 2017-11-22 – 2017-11-25 (×4): 50 mg via ORAL
  Filled 2017-11-22 (×3): qty 1
  Filled 2017-11-22: qty 7

## 2017-11-22 MED ORDER — BENZTROPINE MESYLATE 0.5 MG PO TABS
0.5000 mg | ORAL_TABLET | Freq: Every day | ORAL | Status: DC
  Administered 2017-11-22 – 2017-11-25 (×4): 0.5 mg via ORAL
  Filled 2017-11-22: qty 1
  Filled 2017-11-22: qty 7
  Filled 2017-11-22 (×4): qty 1

## 2017-11-22 MED ORDER — RISPERIDONE 1 MG PO TABS
1.0000 mg | ORAL_TABLET | Freq: Two times a day (BID) | ORAL | Status: DC
  Administered 2017-11-22 – 2017-11-23 (×3): 1 mg via ORAL
  Filled 2017-11-22 (×7): qty 1

## 2017-11-22 NOTE — Progress Notes (Signed)
Recreation Therapy Notes  Date: 11/22/17 Time: 0930 Location: [address] Dayroom  Group Topic: Stress Management  Goal Area(s) Addresses:  Patient will verbalize importance of using healthy stress management.  Patient will identify positive emotions associated with healthy stress management.   Intervention: Stress Management  Activity :  Meditation.  LRT read a meditation to help patients visualize the strength and endurance mountains have and how they can use that same strength to get through the things they are faced with.  Education:  Stress Management, Discharge Planning.   Education Outcome: Acknowledges edcuation/In group clarification offered/Needs additional education  Clinical Observations/Feedback: Pt did not attend group.    Toneisha Savary, LRT/CTRS         Arisbeth Purrington A 11/22/2017 11:27 AM 

## 2017-11-22 NOTE — Progress Notes (Signed)
Cole Barrett is a 31 year old male pt admitted on voluntary basis. On admission, Thayer OhmChris presents as cooperative but guarded. He spoke about how he is hearing voices and how he wants to get rid of the voices. He denies any SI on admission and is able to contract for safety while in the hospital. He does report that he has not been taking his medications as prescribed. He denies any current substance abuse issues and reports that the last time he smoked any marijuana was back around Thanksgiving. He reports that he has his own place and reports he lives there alone and reports he will go back there after discharge. He was oriented to the unit and safety maintained.

## 2017-11-22 NOTE — Progress Notes (Signed)
D: Patient alert and oriented. Affect/mood: Pleasant, cooperative. Denies SI, HI at this time. Endorses auditory hallucinations, which patient denied hearing earlier this morning, however have returned at this time. When asking patient about the content of the voices being heard, patient did not elaborate. "their just voices". Patient does deny that the are command in nature at this time. Denies pain. Goal per patient self inventory sheet: "just being quiet". When asked by this writer to elaborate on his goal, patient reports that he wants to take his medications. Rates depression, hopelessness, and anxiety "0" (0-10).   A: Scheduled medications administered to patient per MD order. Support and encouragement provided. Routine safety checks conducted every 15 minutes. Patient informed to notify staff with problems or concerns. Encouraged to talk to staff if feelings of harm toward self or others arise. Patient agrees.  R: No adverse drug reactions noted. Patient contracts for safety at this time. Patient compliant with medications and treatment plan. Patient receptive, calm, and cooperative. Patient interacts well with others on the unit. Patient remains safe at this time.

## 2017-11-22 NOTE — Tx Team (Signed)
Initial Treatment Plan 11/22/2017 12:06 AM Cole Barrett ZOX:[id number]    PATIENT STRESSORS: Financial difficulties Medication change or noncompliance   PATIENT STRENGTHS: Ability for insight Average or above average intelligence Capable of independent living General fund of knowledge Motivation for treatment/growth   PATIENT IDENTIFIED PROBLEMS: Psychosis Depression "Get rid of the voices"                     DISCHARGE CRITERIA:  Ability to meet basic life and health needs Improved stabilization in mood, thinking, and/or behavior Verbal commitment to aftercare and medication compliance  PRELIMINARY DISCHARGE PLAN: Attend aftercare/continuing care group Return to previous living arrangement  PATIENT/FAMILY INVOLVEMENT: This treatment plan has been presented to and reviewed with the patient, Cole Barrett, and/or family member, .  The patient and family have been given the opportunity to ask questions and make suggestions.  Katti Pelle, L[place]ok Wayne, [place]RN 11/22/2017, 12:06 AM

## 2017-11-22 NOTE — [facility] Suicide Risk Assessment (Signed)
[facility]   Nursing information obtained from:   patient and chart  Demographic factors:   31 year old single male, employed, lives alone  Current Mental Status:   see below Loss Factors:   medication non compliance  Historical Factors:   prior psychiatric admissions for psychotic symptoms Risk Reduction Factors:   resilience, desire for improvement   Total Time spent with patient: 45 minutes Principal Problem: Psychosis Diagnosis:   Patient Active Problem List   Diagnosis Date Noted  . MDD (major depressive disorder), recurrent, severe, with psychosis (HCC) [F33.3] 11/21/2017  . MDD (major depressive disorder) [F32.9] 09/22/2017  . Alcohol abuse [F10.10]   . Auditory hallucinations [R44.0]   . Alcohol abuse with alcohol-induced mood disorder (HCC) [F10.14] 09/08/2016  . Adjustment disorder with depressed mood [F43.21] 09/07/2016  . Homelessness [Z59.0] 09/03/2016  . Person feigning illness [Z76.5] 09/03/2016  . 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 Cannabis use disorder, mild, abuse [F12.10] 08/29/2016  . Pulmonary emboli (HCC) [I26.99] 07/13/2016  . Pulmonary embolism and infarction (HCC) [I26.99] 01/02/2014  . Acute left flank pain [R10.9] 01/02/2014  . Pulmonary embolism, bilateral (HCC) [I26.99] 01/02/2014    Continued Clinical Symptoms:  Alcohol Use Disorder Identification Test Final Score (AUDIT): 9 The "Alcohol Use Disorders Identification Test", Guidelines for Use in Primary Care, Second Edition.  World Science writerHealth Organization [facility](WHO). Score between 0-7:  no or low risk or alcohol related problems. Score between 8-15:  moderate risk of alcohol related problems. Score between 16-19:  high risk of alcohol related problems. Score 20 or above:  warrants further diagnostic evaluation for alcohol dependence and treatment.   CLINICAL FACTORS:  31 year old male, known to our unit from prior admission in October 2018 for psychotic symptoms.  He has been diagnosed with Schizophrenia in the past, but currently does not exhibit or endorse negative symptoms of psychosis. He presented to ED due to worsening , distressing auditory hallucinations which tell him he is going to be killed . Has been non compliant with medications ( Prozac and Risperidone ). Denies recent drug or alcohol use, and admission UDS and BAL are negative.  Psychiatric Specialty Exam: Physical Exam  ROS  Blood pressure 114/74, pulse 88, temperature 97.8 F (36.6 C), temperature source Oral, resp. rate 18, height 5\' 10"  (1.778 m), weight 103.4 kg (228 lb).Body mass index is 32.71 kg/m.   see admit note MSE    COGNITIVE FEATURES THAT CONTRIBUTE TO RISK:  Closed-mindedness and Loss of executive function    SUICIDE RISK:   Moderate:  Frequent suicidal ideation with limited intensity, and duration, some specificity in terms of plans, no associated intent, good self-control, limited dysphoria/symptomatology, some risk factors present, and identifiable protective factors, including available and accessible social support.  PLAN OF CARE: Patient will be admitted to inpatient psychiatric unit for stabilization and safety. Will provide and encourage milieu participation. Provide medication management and maked adjustments as needed.  Will follow daily.    I certify that inpatient services furnished can reasonably be expected to improve the patient's condition.   Craige CottaFernando A Cobos, MD 11/22/2017, 9:42 AM

## 2017-11-22 NOTE — H&P (Signed)
Psychiatric Admission Assessment Adult  Patient Identification: Cole Barrett MRN:  [id number] Date of Evaluation:  11/22/2017 Chief Complaint:  " the voices are getting really loud " Principal Diagnosis: Schizophrenia by history  Diagnosis:   Patient Active Problem List   Diagnosis Date Noted  . MDD (major depressive disorder), recurrent, severe, with psychosis ([place]) [F33.3] 11/21/2017  . MDD (major depressive disorder) [F32.9] 09/22/2017  . Alcohol abuse [F10.10]   . Auditory hallucinations [R44.0]   . Alcohol abuse with alcohol-induced mood disorder ([place]) [F10.14] 09/08/2016  . Adjustment disorder with depressed mood [F43.21] 09/07/2016  . Homelessness [Z59.0] 09/03/2016  . Person feigning illness [Z76.5] 09/03/2016  . Brief psychotic disorder ([place]) [F23] 08/29/2016  . Cannabis use disorder, mild, abuse [F12.10] 08/29/2016  . Pulmonary emboli ([place]) [I26.99] 07/13/2016  . Pulmonary embolism and infarction ([place]) [I26.99] 01/02/2014  . Acute left flank pain [R10.9] 01/02/2014  . Pulmonary embolism, bilateral ([place]) [I26.99] 01/02/2014   History of Present Illness: 31 year old male , presented to ED voluntarily. He is  known to our unit from prior admission on 10/18. At the time he was admitted for auditory hallucinations and paranoia. He was discharged on Risperidone and Prozac . He reports that he has been experiencing increasing auditory hallucinations , saying things such as " they are going to kill you". He does not express delusional ideations at this time, states " I don't feel paranoid, but those voices are getting so loud ".  Of note, he has been off psychiatric medications for several weeks.  Of note, at this time he denies severe or significant depression Denies drug or alcohol abuse and admission UDS and BAL are negative  Associated Signs/Symptoms: Depression Symptoms:  Does not endorse anhedonia, denies significant changes in sleep, appetite or energy level.   (Hypo) Manic Symptoms:  Does not endorse  Anxiety Symptoms:  Denies  Psychotic Symptoms:  Reports auditory hallucinations, at this time does not express delusional ideations PTSD Symptoms: Denies  Total Time spent with patient: 45 minutes  Past Psychiatric History: he reports two prior psychiatric admissions. First psychiatric admission was in 2010. As noted he was admitted to [facility] in October, for psychotic symptoms. Denies history of self cutting, denies history of suicide attempts . He has been diagnosed with Schizophrenia and with MDD in the past . At this time , however, he does not endorse history of severe depressive episodes in the past, and denies history of manic episodes .   Is the patient at risk to self? No.  Has the patient been a risk to self in the past 6 months? No.  Has the patient been a risk to self within the distant past? No.  Is the patient a risk to others? No.  Has the patient been a risk to others in the past 6 months? No.  Has the patient been a risk to others within the distant past? No.   Prior Inpatient Therapy:  as above  Prior Outpatient Therapy:  [facility]  Alcohol Screening: 1. How often do you have a drink containing alcohol?: 2 to 3 times a week 2. How many drinks containing alcohol do you have on a typical day when you are drinking?: 7, 8, or 9 3. How often do you have six or more drinks on one occasion?: Weekly AUDIT-C Score: 9 4. How often during the last year have you found that you were not able to stop drinking once you had started?: Never 5. How often during the last  year have you failed to do what was normally expected from you becasue of drinking?: Never 6. How often during the last year have you needed a first drink in the morning to get yourself going after a heavy drinking session?: Never 7. How often during the last year have you had a feeling of guilt of remorse after drinking?: Never 8. How often during the last year have you been unable  to remember what happened the night before because you had been drinking?: Never 9. Have you or someone else been injured as a result of your drinking?: No 10. Has a relative or friend or a doctor or another health worker been concerned about your drinking or suggested you cut down?: No Alcohol Use Disorder Identification Test Final Score (AUDIT): 9 Intervention/Follow-up: Alcohol Education Substance Abuse History in the last 12 months:  Reports occasional drinking but denies heavy or regular alcohol consumption, denies drug abuse . Denies history of substance dependence  Consequences of Substance Abuse: Denies  Previous Psychotropic Medications: Risperidone , Prozac . States medications were helping , denies side effects, states he stopped taking them because he ran out . Psychological Evaluations:  No  Past Medical History:  Past Medical History:  Diagnosis Date  . Homelessness   . Hypercholesterolemia   . Mental disorder   . Paranoid behavior ([place])   . PE (pulmonary embolism)   . Schizophrenia ([place])    History reviewed. No pertinent surgical history. Family History:  Mother died from MI, father is alive, he has 3 siblings  Family History  Problem Relation Age of Onset  . Hypertension Mother    Family Psychiatric  History: denies mental illness in family, denies suicides in family, denies substance abuse in family Tobacco Screening: Have you used any form of tobacco in the last 30 days? (Cigarettes, Smokeless Tobacco, Cigars, and/or Pipes): Yes Tobacco use, Select all that apply: 5 or more cigarettes per day Are you interested in Tobacco Cessation Medications?: No, patient refused Counseled patient on smoking cessation including recognizing danger situations, developing coping skills and basic information about quitting provided: Refused/Declined practical counseling Social History: Single, no children, lives alone, employed at warehouse . Denies legal issues . Social History    Substance and Sexual Activity  Alcohol Use Yes   Comment: socially     Social History   Substance and Sexual Activity  Drug Use Yes  . Types: Marijuana   Comment: Once every 2-3 months, Last used: Last month    Additional Social History:  Allergies:   Allergies  Allergen Reactions  . Haldol [Haloperidol] Shortness Of Breath   Lab Results:  Results for orders placed or performed during the hospital encounter of 11/20/17 (from the past 48 hour(s))  Comprehensive metabolic panel     Status: Abnormal   Collection Time: 11/20/17  5:27 PM  Result Value Ref Range   Sodium 136 135 - 145 mmol/L   Potassium 3.5 3.5 - 5.1 mmol/L   Chloride 104 101 - 111 mmol/L   CO2 23 22 - 32 mmol/L   Glucose, Bld 87 65 - 99 mg/dL   BUN 9 6 - 20 mg/dL   Creatinine, Ser 0.98 0.61 - 1.24 mg/dL   Calcium 8.7 (L) 8.9 - 10.3 mg/dL   Total Protein 7.1 6.5 - 8.1 g/dL   Albumin 3.8 3.5 - 5.0 g/dL   AST 23 15 - 41 U/L   ALT 31 17 - 63 U/L   Alkaline Phosphatase 49 38 - 126 U/L  Total Bilirubin 0.7 0.3 - 1.2 mg/dL   GFR calc non Af Amer >60 >60 mL/min   GFR calc Af Amer >60 >60 mL/min    Comment: (NOTE) The eGFR has been calculated using the CKD EPI equation. This calculation has not been validated in all clinical situations. eGFR's persistently <60 mL/min signify possible Chronic Kidney Disease.    Anion gap 9 5 - 15  Ethanol     Status: None   Collection Time: 11/20/17  5:27 PM  Result Value Ref Range   Alcohol, Ethyl (B) <10 <10 mg/dL    Comment:        LOWEST DETECTABLE LIMIT FOR SERUM ALCOHOL IS 10 mg/dL FOR MEDICAL PURPOSES ONLY   cbc     Status: Abnormal   Collection Time: 11/20/17  5:27 PM  Result Value Ref Range   WBC 5.4 4.0 - 10.5 K/uL   RBC 4.58 4.22 - 5.81 MIL/uL   Hemoglobin 12.9 (L) 13.0 - 17.0 g/dL   HCT 39.3 39.0 - 52.0 %   MCV 85.8 78.0 - 100.0 fL   MCH 28.2 26.0 - 34.0 pg   MCHC 32.8 30.0 - 36.0 g/dL   RDW 13.1 11.5 - 15.5 %   Platelets 308 150 - 400 K/uL  Rapid  urine drug screen (hospital performed)     Status: None   Collection Time: 11/20/17  5:49 PM  Result Value Ref Range   Opiates NONE DETECTED NONE DETECTED   Cocaine NONE DETECTED NONE DETECTED   Benzodiazepines NONE DETECTED NONE DETECTED   Amphetamines NONE DETECTED NONE DETECTED   Tetrahydrocannabinol NONE DETECTED NONE DETECTED   Barbiturates NONE DETECTED NONE DETECTED    Comment: (NOTE) DRUG SCREEN FOR MEDICAL PURPOSES ONLY.  IF CONFIRMATION IS NEEDED FOR ANY PURPOSE, NOTIFY LAB WITHIN 5 DAYS. LOWEST DETECTABLE LIMITS FOR URINE DRUG SCREEN Drug Class                     Cutoff (ng/mL) Amphetamine and metabolites    1000 Barbiturate and metabolites    200 Benzodiazepine                 371 Tricyclics and metabolites     300 Opiates and metabolites        300 Cocaine and metabolites        300 THC                            50     Blood Alcohol level:  Lab Results  Component Value Date   ETH <10 11/20/2017   ETH <10 05/23/9484    Metabolic Disorder Labs:  Lab Results  Component Value Date   HGBA1C 5.2 08/27/2016   MPG 103 08/27/2016   Lab Results  Component Value Date   PROLACTIN 34.1 (H) 08/27/2016   Lab Results  Component Value Date   CHOL 160 08/27/2016   TRIG 199 (H) 08/27/2016   HDL 39 (L) 08/27/2016   CHOLHDL 4.1 08/27/2016   VLDL 40 08/27/2016   LDLCALC 81 08/27/2016   LDLCALC 124 (H) 03/13/2014    Current Medications: Current Facility-Administered Medications  Medication Dose Route Frequency Provider Last Rate Last Dose  . acetaminophen (TYLENOL) tablet 650 mg  650 mg Oral Q6H PRN Withrow, Elyse Jarvis, FNP      . alum & mag hydroxide-simeth (MAALOX/MYLANTA) 200-200-20 MG/5ML suspension 30 mL  30 mL Oral Q4H PRN Withrow, Elyse Jarvis, FNP      .  benztropine (COGENTIN) tablet 1 mg  1 mg Oral QHS Withrow, Elyse Jarvis, FNP      . FLUoxetine (PROZAC) capsule 20 mg  20 mg Oral Daily Withrow, Elyse Jarvis, FNP   20 mg at 11/22/17 1610  . hydrOXYzine (ATARAX/VISTARIL)  tablet 25 mg  25 mg Oral Q6H PRN Withrow, John C, FNP      . magnesium hydroxide (MILK OF MAGNESIA) suspension 30 mL  30 mL Oral Daily PRN Withrow, Elyse Jarvis, FNP      . risperiDONE (RISPERDAL) tablet 1 mg  1 mg Oral QHS Withrow, John C, FNP      . traZODone (DESYREL) tablet 50 mg  50 mg Oral QHS,MR X 1 Simon, Spencer E, PA-C       PTA Medications: Medications Prior to Admission  Medication Sig Dispense Refill Last Dose  . benztropine (COGENTIN) 0.5 MG tablet Take 1 tablet (0.5 mg total) by mouth at bedtime. For EPS 30 tablet 0 Past Week at Unknown time  . FLUoxetine (PROZAC) 20 MG tablet Take 1 tablet (20 mg total) by mouth daily. For mood control (Patient not taking: Reported on 11/18/2017) 30 tablet 0 Not Taking at Unknown time  . risperiDONE (RISPERDAL) 1 MG tablet Take 1 tablet (1 mg total) by mouth at bedtime. For mood control (Patient not taking: Reported on 11/18/2017) 30 tablet 0 Not Taking at Unknown time    Musculoskeletal: Strength & Muscle Tone: within normal limits Gait & Station: normal Patient leans: N/A  Psychiatric Specialty Exam: Physical Exam  Review of Systems  Constitutional: Negative.   HENT: Negative.   Eyes: Negative.   Respiratory: Negative.   Cardiovascular: Negative.   Gastrointestinal: Negative.   Genitourinary: Negative.   Musculoskeletal: Negative.   Skin: Negative.   Neurological: Negative for seizures.  Endo/Heme/Allergies: Negative.   Psychiatric/Behavioral: Positive for hallucinations.  All other systems reviewed and are negative.   Blood pressure 114/74, pulse 88, temperature 97.8 F (36.6 C), temperature source Oral, resp. rate 18, height '5\' 10"'$  (1.778 m), weight 103.4 kg (228 lb).Body mass index is 32.71 kg/m.  General Appearance: Fairly Groomed  Eye Contact:  Fair  Speech:  Normal Rate  Volume:  Normal  Mood:  states mood is "OK"  Affect:  vaguely guarded and irritable   Thought Process:  Linear and Descriptions of Associations: Intact   Orientation:  Full (Time, Place, and Person)  Thought Content:  reports auditory hallucinations , telling him " they are going to kill me ". does not appear internally preoccupied . No delusions are expressed, but does present cautious, vaguely guarded in demeanor   Suicidal Thoughts:  No denies any suicidal or self injurious ideations, denies homicidal ideations   Homicidal Thoughts:  No  Memory:  recent and remote grossly intact   Judgement:  Fair  Insight:  Fair  Psychomotor Activity:  Normal  Concentration:  Concentration: Fair and Attention Span: Fair  Recall:  Good  Fund of Knowledge:  Good  Language:  Good  Akathisia:  Negative  Handed:  Right  AIMS (if indicated):     Assets:  Desire for Improvement Resilience  ADL's:  Intact  Cognition:  WNL  Sleep:       Treatment Plan Summary: Daily contact with patient to assess and evaluate symptoms and progress in treatment, Medication management, Plan inpatient treatment  and medications as below  Observation Level/Precautions:  15 minute checks  Laboratory:  as needed   Psychotherapy:  Milieu, group therapy   Medications:  We discussed options- he states Risperidone and  Prozac combination had been helpful and well tolerated  Restart Risperidone at 1 mgr BID, Prozac at 20 mgrs QDAY , Cogentin at 0.5 mgrs QDAY .    Consultations:  As needed   Discharge Concerns: -    Estimated LOS: 5 days   Other:     Physician Treatment Plan for Primary Diagnosis: Schizophrenia , Paranoid  Long Term Goal(s): Improvement in symptoms so as ready for discharge  Short Term Goals: Ability to identify changes in lifestyle to reduce recurrence of condition will improve, Ability to maintain clinical measurements within normal limits will improve and Compliance with prescribed medications will improve  Physician Treatment Plan for Secondary Diagnosis: Psychosis  Long Term Goal(s): Improvement in symptoms so as ready for discharge  Short Term  Goals: Ability to identify changes in lifestyle to reduce recurrence of condition will improve, Ability to identify and develop effective coping behaviors will improve, Ability to maintain clinical measurements within normal limits will improve and Compliance with prescribed medications will improve  I certify that inpatient services furnished can reasonably be expected to improve the patient's condition.    Jenne Campus, MD 12/26/20189:15 AM

## 2017-11-22 NOTE — Progress Notes (Signed)
Nursing Progress Note [id number]  D) Patient presents with flat affect but is calm and cooperative this evening. Patient did attend group. Patient is seen up in the milieu. Patient denies SI/HI or pain. Patient continues to endorse auditory hallucinations. Patient contracts for safety on the unit. Patient reports sleeping well with current regimen and requests PRN trazodone for sleep. Patient compliant with scheduled medications.  A) Emotional support given. 1:1 interaction and active listening provided. Patient medicated as prescribed. Medications and plan of care reviewed with patient. Patient verbalized understanding without further questions. Snacks and fluids provided. Opportunities for questions or concerns presented to patient. Patient encouraged to continue to work on treatment goals. Labs, vital signs and patient behavior monitored throughout shift. Patient safety maintained with q15 min safety checks. Low fall risk precautions in place and reviewed with patient; patient verbalized understanding.  R) Patient receptive to interaction with nurse. Patient remains safe on the unit at this time. Patient denies any adverse medication reactions at this time. Patient is resting in bed without complaints. Will continue to monitor.

## 2017-11-22 NOTE — Tx Team (Signed)
Interdisciplinary Treatment and Diagnostic Plan Update  11/22/2017 Time of Session: [place] MRN: [id number]  Principal Diagnosis: <principal problem not specified>  Secondary Diagnoses: Active Problems:   MDD (major depressive disorder), recurrent, severe, with psychosis ([place])   Current Medications:  Current Facility-Administered Medications  Medication Dose Route Frequency Provider Last Rate Last Dose  . acetaminophen (TYLENOL) tablet 650 mg  650 mg Oral Q6H PRN Withrow, Elyse Jarvis, FNP      . alum & mag hydroxide-simeth (MAALOX/MYLANTA) 200-200-20 MG/5ML suspension 30 mL  30 mL Oral Q4H PRN Withrow, John C, FNP      . benztropine (COGENTIN) tablet 0.5 mg  0.5 mg Oral QHS Cobos, Fernando A, MD      . FLUoxetine (PROZAC) capsule 20 mg  20 mg Oral Daily Withrow, John C, FNP   20 mg at 11/22/17 3762  . hydrOXYzine (ATARAX/VISTARIL) tablet 25 mg  25 mg Oral Q6H PRN Withrow, John C, FNP      . magnesium hydroxide (MILK OF MAGNESIA) suspension 30 mL  30 mL Oral Daily PRN Withrow, Elyse Jarvis, FNP      . risperiDONE (RISPERDAL) tablet 1 mg  1 mg Oral BID Cobos, Myer Peer, MD   1 mg at 11/22/17 1106  . traZODone (DESYREL) tablet 50 mg  50 mg Oral QHS PRN Cobos, Myer Peer, MD       PTA Medications: Medications Prior to Admission  Medication Sig Dispense Refill Last Dose  . benztropine (COGENTIN) 0.5 MG tablet Take 1 tablet (0.5 mg total) by mouth at bedtime. For EPS 30 tablet 0 Past Week at Unknown time  . FLUoxetine (PROZAC) 20 MG tablet Take 1 tablet (20 mg total) by mouth daily. For mood control (Patient not taking: Reported on 11/18/2017) 30 tablet 0 Not Taking at Unknown time  . risperiDONE (RISPERDAL) 1 MG tablet Take 1 tablet (1 mg total) by mouth at bedtime. For mood control (Patient not taking: Reported on 11/18/2017) 30 tablet 0 Not Taking at Unknown time    Patient Stressors: Financial difficulties Medication change or noncompliance  Patient Strengths: Ability for  insight Average or above average intelligence Capable of independent living [name] of knowledge Motivation for treatment/growth  Treatment Modalities: Medication Management, Group therapy, Case management,  1 to 1 session with clinician, Psychoeducation, Recreational therapy.   Physician Treatment Plan for Primary Diagnosis: <principal problem not specified> Long Term Goal(s): Improvement in symptoms so as ready for discharge Improvement in symptoms so as ready for discharge   Short Term Goals: Ability to identify changes in lifestyle to reduce recurrence of condition will improve Ability to maintain clinical measurements within normal limits will improve Compliance with prescribed medications will improve Ability to identify changes in lifestyle to reduce recurrence of condition will improve Ability to identify and develop effective coping behaviors will improve Ability to maintain clinical measurements within normal limits will improve Compliance with prescribed medications will improve  Medication Management: Evaluate patient's response, side effects, and tolerance of medication regimen.  Therapeutic Interventions: 1 to 1 sessions, Unit Group sessions and Medication administration.  Evaluation of Outcomes: Not Met  Physician Treatment Plan for Secondary Diagnosis: Active Problems:   MDD (major depressive disorder), recurrent, severe, with psychosis ([place])  Long Term Goal(s): Improvement in symptoms so as ready for discharge Improvement in symptoms so as ready for discharge   Short Term Goals: Ability to identify changes in lifestyle to reduce recurrence of condition will improve Ability to maintain clinical measurements within normal  limits will improve Compliance with prescribed medications will improve Ability to identify changes in lifestyle to reduce recurrence of condition will improve Ability to identify and develop effective coping behaviors will improve Ability  to maintain clinical measurements within normal limits will improve Compliance with prescribed medications will improve     Medication Management: Evaluate patient's response, side effects, and tolerance of medication regimen.  Therapeutic Interventions: 1 to 1 sessions, Unit Group sessions and Medication administration.  Evaluation of Outcomes: Not Met   RN Treatment Plan for Primary Diagnosis: <principal problem not specified> Long Term Goal(s): Knowledge of disease and therapeutic regimen to maintain health will improve  Short Term Goals: Ability to identify and develop effective coping behaviors will improve and Compliance with prescribed medications will improve  Medication Management: RN will administer medications as ordered by provider, will assess and evaluate patient's response and provide education to patient for prescribed medication. RN will report any adverse and/or side effects to prescribing provider.  Therapeutic Interventions: 1 on 1 counseling sessions, Psychoeducation, Medication administration, Evaluate responses to treatment, Monitor vital signs and CBGs as ordered, Perform/monitor CIWA, COWS, AIMS and Fall Risk screenings as ordered, Perform wound care treatments as ordered.  Evaluation of Outcomes: Not Met   LCSW Treatment Plan for Primary Diagnosis: <principal problem not specified> Long Term Goal(s): Safe transition to appropriate next level of care at discharge, Engage patient in therapeutic group addressing interpersonal concerns.  Short Term Goals: Engage patient in aftercare planning with referrals and resources, Increase social support and Increase skills for wellness and recovery  Therapeutic Interventions: Assess for all discharge needs, 1 to 1 time with Social worker, Explore available resources and support systems, Assess for adequacy in community support network, Educate family and significant other(s) on suicide prevention, Complete Psychosocial  Assessment, Interpersonal group therapy.  Evaluation of Outcomes: Not Met   Progress in Treatment: Attending groups: No. Participating in groups: No. Taking medication as prescribed: Yes. Toleration medication: Yes. Family/Significant other contact made: No, will contact:  when given permission Patient understands diagnosis: Yes. Discussing patient identified problems/goals with staff: Yes. Medical problems stabilized or resolved: Yes. Denies suicidal/homicidal ideation: Yes. Issues/concerns per patient self-inventory: No. Other: none  New problem(s) identified: No, Describe:  none  New Short Term/Long Term Goal(s):  Discharge Plan or Barriers:   Reason for Continuation of Hospitalization: Hallucinations Medication stabilization  Estimated Length of Stay: 5-7 days. Attendees: Patient: 11/22/2017   Physician: Dr Parke Poisson, MD 11/22/2017  Nursing: Mayra Neer, RN 11/22/2017   RN Care Manager: 11/22/2017   Social Worker: Lurline Idol, LCSW 11/22/2017   Recreational Therapist:  11/22/2017   Other:  11/22/2017   Other:  11/22/2017   Other: 11/22/2017            Scribe for Treatment Team: Joanne Chars, [place] 11/22/2017 2:26 PM

## 2017-11-23 DIAGNOSIS — R450 Nervousness: Secondary | ICD-10-CM

## 2017-11-23 DIAGNOSIS — F419 Anxiety disorder, unspecified: Secondary | ICD-10-CM

## 2017-11-23 DIAGNOSIS — F332 Major depressive disorder, recurrent severe without psychotic features: Secondary | ICD-10-CM

## 2017-11-23 DIAGNOSIS — F1210 Cannabis abuse, uncomplicated: Secondary | ICD-10-CM

## 2017-11-23 DIAGNOSIS — G4700 Insomnia, unspecified: Secondary | ICD-10-CM

## 2017-11-23 LAB — TSH: TSH: 1.755 u[IU]/mL (ref 0.350–4.500)

## 2017-11-23 MED ORDER — RISPERIDONE 0.5 MG PO TABS
0.5000 mg | ORAL_TABLET | Freq: Every day | ORAL | Status: DC
  Filled 2017-11-23 (×2): qty 1

## 2017-11-23 MED ORDER — RISPERIDONE 1 MG PO TABS
1.0000 mg | ORAL_TABLET | Freq: Every day | ORAL | Status: DC
  Administered 2017-11-24: 1 mg via ORAL
  Filled 2017-11-23 (×2): qty 1

## 2017-11-23 MED ORDER — RISPERIDONE 2 MG PO TABS
2.0000 mg | ORAL_TABLET | Freq: Every day | ORAL | Status: DC
  Administered 2017-11-23: 2 mg via ORAL
  Filled 2017-11-23 (×2): qty 1

## 2017-11-23 NOTE — Progress Notes (Signed)
Pt attend wrap up group. His day was a 10. His goal was being cool for today.

## 2017-11-23 NOTE — Progress Notes (Signed)
Patient ID: Cole HawkingChristopher Adam Barrett, male   DOB: 01/21/1986, 31 y.o.   MRN: [id number]  Pt currently presents with a masked affect and restless behavior. Interaction with patient assertive during medication pass. At the ending of interaction, pt looks as if he is responding to internal stimuli. Has to be verbally redirected to step away from window. Pt reports good sleep with current medication regimen.   Pt provided with medications per providers orders. Pt's labs and vitals were monitored throughout the night. Pt given a 1:1 about emotional and mental status. Pt supported and encouraged to express concerns and questions. Pt educated on medications.  Pt's safety ensured with 15 minute and environmental checks. Pt currently denies SI/HI and A/V hallucinations. Pt verbally agrees to seek staff if SI/HI or A/VH occurs and to consult with staff before acting on any harmful thoughts. Will continue POC.

## 2017-11-23 NOTE — Progress Notes (Signed)
DAR NOTE: Pt present with flat affect and depressed mood in the unit. Pt has been isolating himself and has been bed most of the am.  Pt denies physical pain, took all his meds as scheduled. As per self inventory, pt had a good night sleep, good appetite, normal energy, and good concentration. Pt rate depression at 0, hopeless ness at 0, and anxiety at 0. Pt's safety ensured with 15 minute and environmental checks. Pt currently denies SI/HI and A/V hallucinations. Pt verbally agrees to seek staff if SI/HI or A/VH occurs and to consult with staff before acting on these thoughts. Will continue POC.

## 2017-11-23 NOTE — Progress Notes (Signed)
[facility] MD Progress Note  11/23/2017 3:31 PM Cole Barrett  MRN:  [id number]   Subjective:  Patient reports that he is feeling better, but the voices in his head are still loud and they keep bothering him. He denies any SI/HI/VH. He also denies nay depression or anxiety today. He admits to eating and sleeping good.   Objective: Patient's chart and findings reviewed and discussed with treatment team. Patient presents pleasant and cooperative. He talks in a loud voice when talking as if the voices are that loud in his head. Due to continued AH will increase Risperidone to 2 mg QHS and 1 mg Daily.   Principal Problem: MDD (major depressive disorder), recurrent, severe, with psychosis ([facility]) Diagnosis:   Patient Active Problem List   Diagnosis Date Noted  . MDD (major depressive disorder), recurrent, severe, with psychosis (HCC) [F33.3] 11/21/2017  . MDD (major depressive disorder) [F32.9] 09/22/2017  . Alcohol abuse [F10.10]   . Auditory hallucinations [R44.0]   . Alcohol abuse with alcohol-induced mood disorder (HCC) [F10.14] 09/08/2016  . Adjustment disorder with depressed mood [F43.21] 09/07/2016  . Homelessness [Z59.0] 09/03/2016  . Person feigning illness [Z76.5] 09/03/2016  . 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 Cannabis use disorder, mild, abuse [F12.10] 08/29/2016  . Pulmonary emboli (HCC) [I26.99] 07/13/2016  . Pulmonary embolism and infarction (HCC) [I26.99] 01/02/2014  . Acute left flank pain [R10.9] 01/02/2014  . Pulmonary embolism, bilateral (HCC) [I26.99] 01/02/2014   Total Time spent with patient: 25 minutes  Past Psychiatric History: See H&P  Past Medical History:  Past Medical History:  Diagnosis Date  . Homelessness   . Hypercholesterolemia   . Mental disorder   . Paranoid behavior (HCC)   . PE (pulmonary embolism)   . Schizophrenia (HCC)    History reviewed. No pertinent surgical history. Family History:  Family History  Problem Relation  Age of Onset  . Hypertension Mother    Family Psychiatric  History: See H&P Social History:  Social History   Substance and Sexual Activity  Alcohol Use Yes   Comment: socially     Social History   Substance and Sexual Activity  Drug Use Yes  . Types: Marijuana   Comment: Once every 2-3 months, Last used: Last month    Social History   Socioeconomic History  . Marital status: Single    Spouse name: None  . Number of children: None  . Years of education: None  . Highest education level: None  Social Needs  . Financial resource strain: None  . Food insecurity - worry: None  . Food insecurity - inability: None  . Transportation needs - medical: None  . Transportation needs - non-medical: None  Occupational History  . None  Tobacco Use  . Smoking status: Current Every Day Smoker    Packs/day: 0.50    Types: Cigarettes  . Smokeless tobacco: Never Used  Substance and Sexual Activity  . Alcohol use: Yes    Comment: socially  . Drug use: Yes    Types: Marijuana    Comment: Once every 2-3 months, Last used: Last month  . Sexual activity: No  Other Topics Concern  . None  Social History Narrative  . None   Additional Social History:                         Sleep: Good  Appetite:  Good  Current Medications: Current Facility-Administered Medications  Medication Dose Route Frequency Provider Last  Rate Last Dose  . acetaminophen (TYLENOL) tablet 650 mg  650 mg Oral Q6H PRN Withrow, Everardo AllJohn C, FNP      . alum & mag hydroxide-simeth (MAALOX/MYLANTA) 200-200-20 MG/5ML suspension 30 mL  30 mL Oral Q4H PRN Withrow, John C, FNP      . benztropine (COGENTIN) tablet 0.5 mg  0.5 mg Oral QHS Atha Muradyan, Rockey SituFernando A, MD   0.5 mg at 11/22/17 2124  . FLUoxetine (PROZAC) capsule 20 mg  20 mg Oral Daily Withrow, Everardo AllJohn C, FNP   20 mg at 11/23/17 [id number]  . hydrOXYzine (ATARAX/VISTARIL) tablet 25 mg  25 mg Oral Q6H PRN Withrow, John C, FNP      . magnesium hydroxide (MILK OF MAGNESIA)  suspension 30 mL  30 mL Oral Daily PRN Withrow, Everardo AllJohn C, FNP      . [START ON 11/24/2017] risperiDONE (RISPERDAL) tablet 1 mg  1 mg Oral Daily Money, Gerlene Burdockravis B, FNP      . risperiDONE (RISPERDAL) tablet 2 mg  2 mg Oral QHS Money, Travis B, FNP      . traZODone (DESYREL) tablet 50 mg  50 mg Oral QHS PRN Kyarah Enamorado, Rockey SituFernando A, MD   50 mg at 11/22/17 2124    Lab Results: No results found for this or any previous visit (from the past 48 hour(s)).  Blood Alcohol level:  Lab Results  Component Value Date   ETH <10 11/20/2017   ETH <10 11/18/2017    Metabolic Disorder Labs: Lab Results  Component Value Date   HGBA1C 5.2 08/27/2016   MPG 103 08/27/2016   Lab Results  Component Value Date   PROLACTIN 34.1 (H) 08/27/2016   Lab Results  Component Value Date   CHOL 160 08/27/2016   TRIG 199 (H) 08/27/2016   HDL 39 (L) 08/27/2016   CHOLHDL 4.1 08/27/2016   VLDL 40 08/27/2016   LDLCALC 81 08/27/2016   LDLCALC 124 (H) 03/13/2014    Physical Findings: AIMS: Facial and Oral Movements Muscles of Facial Expression: None, normal Lips and Perioral Area: None, normal Jaw: None, normal Tongue: None, normal,Extremity Movements Upper (arms, wrists, hands, fingers): None, normal Lower (legs, knees, ankles, toes): None, normal, Trunk Movements Neck, shoulders, hips: None, normal, Overall Severity Severity of abnormal movements (highest score from questions above): None, normal Incapacitation due to abnormal movements: None, normal Patient's awareness of abnormal movements (rate only patient's report): No Awareness, Dental Status Current problems with teeth and/or dentures?: No Does patient usually wear dentures?: No  CIWA:    COWS:     Musculoskeletal: Strength & Muscle Tone: within normal limits Gait & Station: normal Patient leans: N/A  Psychiatric Specialty Exam: Physical Exam  Nursing note and vitals reviewed. Constitutional: He is oriented to person, place, and time. He appears  well-developed and well-nourished.  Cardiovascular: Normal rate.  Respiratory: Effort normal.  Musculoskeletal: Normal range of motion.  Neurological: He is alert and oriented to person, place, and time.  Skin: Skin is warm.    Review of Systems  Constitutional: Negative.   HENT: Negative.   Eyes: Negative.   Respiratory: Negative.   Cardiovascular: Negative.   Gastrointestinal: Negative.   Genitourinary: Negative.   Musculoskeletal: Negative.   Skin: Negative.   Neurological: Negative.   Endo/Heme/Allergies: Negative.   Psychiatric/Behavioral: Positive for hallucinations (Loud AH). Negative for depression and suicidal ideas. The patient is not nervous/anxious.     Blood pressure 116/61, pulse 79, temperature 97.8 F (36.6 C), temperature source Oral, resp. rate 18, height 5'  10" (1.778 m), weight 103.4 kg (228 lb).Body mass index is 32.71 kg/m.  General Appearance: Casual  Eye Contact:  Good  Speech:  Clear and Coherent and Normal Rate  Volume:  Increased  Mood:  Anxious  Affect:  Flat  Thought Process:  Goal Directed and Descriptions of Associations: Intact  Orientation:  Full (Time, Place, and Person)  Thought Content:  Hallucinations: Auditory  Suicidal Thoughts:  No  Homicidal Thoughts:  No  Memory:  Immediate;   Good Recent;   Good Remote;   Good  Judgement:  Fair  Insight:  Fair  Psychomotor Activity:  Normal  Concentration:  Concentration: Good and Attention Span: Good  Recall:  Good  Fund of Knowledge:  Good  Language:  Good  Akathisia:  No  Handed:  Right  AIMS (if indicated):     Assets:  Communication Skills Desire for Improvement Financial Resources/Insurance Physical Health Social Support  ADL's:  Intact  Cognition:  WNL  Sleep:  Number of Hours: 6.75   Problems Addressed: MDD severe with psychosis  Treatment Plan Summary: Daily contact with patient to assess and evaluate symptoms and progress in treatment, Medication management and Plan is  to:  -Change Risperidone 1 mg PO Daily and 2 mg PO QHS for mood stability -Continue Prozac 20 mg PO Daily for mood stability -Continue Cogentin 0.5 mg PO QHS for EPS -Ordered TSH to rule out hypo- or hyperthyroidism -Continue Vistaril 25 mg PO Q6H PRN for anxiety -Continue Trazodone 50 mg PO QHS PRN for insomnia -Encourage group therapy participation  Maryfrances Bunnellravis B Money, FNP 11/23/2017, 3:31 PM   Agree with NP Progress Note

## 2017-11-24 MED ORDER — RISPERIDONE 2 MG PO TABS
2.0000 mg | ORAL_TABLET | Freq: Two times a day (BID) | ORAL | Status: DC
  Administered 2017-11-24 – 2017-11-26 (×4): 2 mg via ORAL
  Filled 2017-11-24: qty 1
  Filled 2017-11-24: qty 14
  Filled 2017-11-24 (×3): qty 1
  Filled 2017-11-24: qty 14
  Filled 2017-11-24 (×2): qty 1

## 2017-11-24 NOTE — Progress Notes (Signed)
Pt laying on the bed in his room on his back. Pt turns on his left side and pull his pants down so his posterior back and buttocks is exposed. Pt very inappropriate on the unit. RN notified

## 2017-11-24 NOTE — Progress Notes (Signed)
Recreation Therapy Notes  Date: 11/24/17 Time: 0930 Location: [address] Dayroom  Group Topic: Stress Management  Goal Area(s) Addresses:  Patient will verbalize importance of using healthy stress management.  Patient will identify positive emotions associated with healthy stress management.   Intervention: Stress Management  Activity :  Body Scan Meditation.  LRT introduced the stress management technique of meditation.  LRT played a meditation from the Calm app that guided them through a body scan to allow them to become aware of any sensations, tensions or uneasy feelings they may be experiencing.  Education:  Stress Management, Discharge Planning.   Education Outcome: Acknowledges edcuation/In group clarification offered/Needs additional education  Clinical Observations/Feedback: Pt did not attend group.    Caroll RancherMarjette Shamecca Whitebread, LRT/CTRS         Lillia AbedLindsay, Holli Rengel A 11/24/2017 11:09 AM

## 2017-11-24 NOTE — Progress Notes (Signed)
Pt standing up in the dayroom putting both hands inside in pants moving them up and down. Staff walk in the dayroom. Pt takes his hands and pull them out of his pants and sit down.

## 2017-11-24 NOTE — [facility] Suicide Risk Assessment (Signed)
[facility] INPATIENT:  Family/Significant Other Suicide Prevention Education  Suicide Prevention Education:  Education Completed; NO ONE has been identified by the patient as the family member/significant other with whom the patient will be residing, and identified as the person(s) who will aid the patient in the event of a mental health crisis (suicidal ideations/suicide attempt).  With written consent from the patient, the family member/significant other has been provided the following suicide prevention education, prior to the and/or following the discharge of the patient.  The suicide prevention education provided includes the following:  Suicide risk factors  Suicide prevention and interventions  National Suicide Hotline telephone number  [facility] assessment telephone number  [facility] 911  [facility] and/or [facility] telephone number  Request made of family/significant other to:  Remove weapons (e.g., guns, rifles, knives), all items previously/currently identified as safety concern.    Remove drugs/medications (over-the-counter, prescriptions, illicit drugs), all items previously/currently identified as a safety concern.  The family member/significant other verbalizes understanding of the suicide prevention education information provided.  The family member/significant other agrees to remove the items of safety concern listed above.  The patient DID NOT endorse suicidal ideation prior to or during his stay at the hospital. No one was contacted for suicidal education prevention.   Cole Barrett 11/24/2017, 10:09 AM

## 2017-11-24 NOTE — Progress Notes (Signed)
[facility] MD Progress Note  11/24/2017 3:19 PM Cole Barrett  MRN:  [id number]   Subjective:  Patient states he is better than on admission. States hallucinations persist, but have abated in frequency and intensity, and feels less distressed by them at this time. Still, he reports his hope is that they will resolve completely .  Denies medication side effects and at this time and he feels medications are helping . Reports improving mood and denies suicidal ideations at this time.   Objective: I have discussed case with treatment team and have met with patient . Patient , as above, reports feeling better overall and describes a reduction in auditory hallucinations. States he is still hearing voice telling him he is going to be killed , but less intensely and easier to ignore at this time. Does not appear internally preoccupied, and does not exhibit delusional ideations at this time. As per nursing staff, behavior still disorganized at times, and appearing internally preoccupied at times . No overtly disruptive behaviors on unit, and today presents pleasant on approach. Denies medication side effects. No akathisia. No cog-wheeling . Denies suicidal ideations. TSH WNL   Principal Problem: MDD (major depressive disorder), recurrent, severe, with psychosis ([place]) Diagnosis:   Patient Active Problem List   Diagnosis Date Noted  . MDD (major depressive disorder), recurrent, severe, with psychosis ([place]) [F33.3] 11/21/2017  . MDD (major depressive disorder) [F32.9] 09/22/2017  . Alcohol abuse [F10.10]   . Auditory hallucinations [R44.0]   . Alcohol abuse with alcohol-induced mood disorder ([place]) [F10.14] 09/08/2016  . Adjustment disorder with depressed mood [F43.21] 09/07/2016  . Homelessness [Z59.0] 09/03/2016  . Person feigning illness [Z76.5] 09/03/2016  . 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 Cannabis use disorder, mild, abuse [F12.10] 08/29/2016  . Pulmonary emboli ([place])  [I26.99] 07/13/2016  . Pulmonary embolism and infarction ([place]) [I26.99] 01/02/2014  . Acute left flank pain [R10.9] 01/02/2014  . Pulmonary embolism, bilateral ([place]) [I26.99] 01/02/2014   Total Time spent with patient: 20 minutes  Past Psychiatric History: See H&P  Past Medical History:  Past Medical History:  Diagnosis Date  . Homelessness   . Hypercholesterolemia   . Mental disorder   . Paranoid behavior ([place])   . PE (pulmonary embolism)   . Schizophrenia ([place])    History reviewed. No pertinent surgical history. Family History:  Family History  Problem Relation Age of Onset  . Hypertension Mother    Family Psychiatric  History: See H&P Social History:  Social History   Substance and Sexual Activity  Alcohol Use Yes   Comment: socially     Social History   Substance and Sexual Activity  Drug Use Yes  . Types: Marijuana   Comment: Once every 2-3 months, Last used: Last month    Social History   Socioeconomic History  . Marital status: Single    Spouse name: None  . Number of children: None  . Years of education: None  . Highest education level: None  Social Needs  . Financial resource strain: None  . Food insecurity - worry: None  . Food insecurity - inability: None  . Transportation needs - medical: None  . Transportation needs - non-medical: None  Occupational History  . None  Tobacco Use  . Smoking status: Current Every Day Smoker    Packs/day: 0.50    Types: Cigarettes  . Smokeless tobacco: Never Used  Substance and Sexual Activity  . Alcohol use: Yes    Comment: socially  . Drug  use: Yes    Types: Marijuana    Comment: Once every 2-3 months, Last used: Last month  . Sexual activity: No  Other Topics Concern  . None  Social History Narrative  . None   Additional Social History:   Sleep: Good  Appetite:  improved  Current Medications: Current Facility-Administered Medications  Medication Dose Route Frequency Provider Last Rate Last  Dose  . acetaminophen (TYLENOL) tablet 650 mg  650 mg Oral Q6H PRN Withrow, Elyse Jarvis, FNP      . alum & mag hydroxide-simeth (MAALOX/MYLANTA) 200-200-20 MG/5ML suspension 30 mL  30 mL Oral Q4H PRN Withrow, John C, FNP      . benztropine (COGENTIN) tablet 0.5 mg  0.5 mg Oral QHS Taylen Wendland, Myer Peer, MD   0.5 mg at 11/23/17 2132  . FLUoxetine (PROZAC) capsule 20 mg  20 mg Oral Daily Withrow, Elyse Jarvis, FNP   20 mg at 11/24/17 0923  . hydrOXYzine (ATARAX/VISTARIL) tablet 25 mg  25 mg Oral Q6H PRN Withrow, John C, FNP      . magnesium hydroxide (MILK OF MAGNESIA) suspension 30 mL  30 mL Oral Daily PRN Withrow, Elyse Jarvis, FNP      . risperiDONE (RISPERDAL) tablet 2 mg  2 mg Oral BID Nakeyia Menden, Myer Peer, MD      . traZODone (DESYREL) tablet 50 mg  50 mg Oral QHS PRN Lindley Stachnik, Myer Peer, MD   50 mg at 11/23/17 2235    Lab Results:  Results for orders placed or performed during the hospital encounter of 11/21/17 (from the past 48 hour(s))  TSH     Status: None   Collection Time: 11/23/17  6:29 PM  Result Value Ref Range   TSH 1.755 0.350 - 4.500 uIU/mL    Comment: Performed by a 3rd Generation assay with a functional sensitivity of <=0.01 uIU/mL. Performed at [facility], [place] [address] [place], [place] [address]     Blood Alcohol level:  Lab Results  Component Value Date   ETH <10 11/20/2017   ETH <10 22/63/3354    Metabolic Disorder Labs: Lab Results  Component Value Date   HGBA1C 5.2 08/27/2016   MPG 103 08/27/2016   Lab Results  Component Value Date   PROLACTIN 34.1 (H) 08/27/2016   Lab Results  Component Value Date   CHOL 160 08/27/2016   TRIG 199 (H) 08/27/2016   HDL 39 (L) 08/27/2016   CHOLHDL 4.1 08/27/2016   VLDL 40 08/27/2016   LDLCALC 81 08/27/2016   LDLCALC 124 (H) 03/13/2014    Physical Findings: AIMS: Facial and Oral Movements Muscles of Facial Expression: None, normal Lips and Perioral Area: None, normal Jaw: None, normal Tongue: None,  normal,Extremity Movements Upper (arms, wrists, hands, fingers): None, normal Lower (legs, knees, ankles, toes): None, normal, Trunk Movements Neck, shoulders, hips: None, normal, Overall Severity Severity of abnormal movements (highest score from questions above): None, normal Incapacitation due to abnormal movements: None, normal Patient's awareness of abnormal movements (rate only patient's report): No Awareness, Dental Status Current problems with teeth and/or dentures?: No Does patient usually wear dentures?: No  CIWA:    COWS:     Musculoskeletal: Strength & Muscle Tone: within normal limits no akathisia or cog wheeling noted  Gait & Station: normal Patient leans: N/A  Psychiatric Specialty Exam: Physical Exam  Nursing note and vitals reviewed. Constitutional: He is oriented to person, place, and time. He appears well-developed and well-nourished.  Cardiovascular: Normal rate.  Respiratory: Effort  normal.  Musculoskeletal: Normal range of motion.  Neurological: He is alert and oriented to person, place, and time.  Skin: Skin is warm.    Review of Systems  Constitutional: Negative.   HENT: Negative.   Eyes: Negative.   Respiratory: Negative.   Cardiovascular: Negative.   Gastrointestinal: Negative.   Genitourinary: Negative.   Musculoskeletal: Negative.   Skin: Negative.   Neurological: Negative.   Endo/Heme/Allergies: Negative.   Psychiatric/Behavioral: Positive for hallucinations ([place] [facility]). Negative for depression and suicidal ideas. The patient is not nervous/anxious.   denies chest pain, no shortness of breath, no vomiting   Blood pressure (!) 93/57, pulse 86, temperature (!) 97.3 F (36.3 C), resp. rate 18, height _0  (1.778 m), weight 103.4 kg (228 lb).Body mass index is 32.71 kg/m.  General Appearance: Fairly Groomed- improved compared to admission  Eye Contact:  Good  Speech:  Normal Rate  Volume:  Normal  Mood:  states he is feeling better   Affect:   more reactive   Thought Process:  Linear and Descriptions of Associations: Intact no tangentiality noted today  Orientation:  Other:  fully alert and attentive   Thought Content:  reports hallucinations as above- auditory hallucinations telling him they are going to kill him. States these have decreased, improved, but not resolved. No delusions expressed. Does not appear internally preoccupied .  Suicidal Thoughts:  No denies suicidal or self injurious ideations, denies homicidal or violent ideations   Homicidal Thoughts:  No  Memory:  recent and remote fair   Judgement:  Fair- improving   Insight:  Fair  Psychomotor Activity:  Normal  Concentration:  Concentration: Good and Attention Span: Good  Recall:  Good  Fund of Knowledge:  Good  Language:  Good  Akathisia:  No  Handed:  Right  AIMS (if indicated):     Assets:  Communication Skills Desire for Improvement Financial Resources/Insurance Physical Health Social Support  ADL's:  Improving   Cognition:  WNL  Sleep:  Number of Hours: 5   Assessment - patient presents with partially improved psychotic symptoms:hallucinations now less loud, less intense, but still present. Mood presents improved and affect more reactive . Behavior disorganized at times, but not overtly disruptive or agitated . Thus far tolerating Risperidone well .  Treatment Plan Summary: Treatment Plan reviewed as below today 12/28 Daily contact with patient to assess and evaluate symptoms and progress in treatment, Medication management and Plan is to:  -Change Risperidone to 2 mgrs BID to address psychosis, mood  -Continue Prozac 20 mg PO Daily for mood/ depression -Continue Cogentin 0.5 mg PO QHS to minimize risk of EPS -Continue Vistaril 25 mg PO Q6H PRN for anxiety -Continue Trazodone 50 mg PO QHS PRN for insomnia -Encourage group therapy participation  Jenne Campus, MD 11/24/2017, 3:19 PM   Patient ID: Cole Barrett, male   DOB: 26-Apr-1986,  31 y.o.   MRN: [id number]

## 2017-11-24 NOTE — Progress Notes (Signed)
DAR NOTE: Patient presents with anxious affect and mood.  Denies pain, auditory and visual hallucinations.  Described energy level as high and concentration as good.  Rates depression at , hopelessness at , and anxiety at .  Maintained on routine safety checks.  Medications given as prescribed.  Support and encouragement offered as needed.  Attended group and participated.  States goal for today is "just being cool."  Patient visible in milieu with minimal interactions.  Offered no complaint.

## 2017-11-24 NOTE — Progress Notes (Signed)
Nursing Progress Note 1900-0730  D) Patient presents with blunted affect and anxious mood. Patient appeared restless this evening and was heard  banging on his bed/walls by staff but denies doing so when staff inquires. Patient requiring redirection by MHT and Clinical research associatewriter. Patient denies concerns and is compliant with scheduled medications. Patient denies SI/HI or pain. Patient endorses auditory hallucinations. Patient contracts for safety on the unit. Patient is up in the milieu but does not engage with peers.  A) Emotional support given. 1:1 interaction and active listening provided. Patient medicated as prescribed. Medications and plan of care reviewed with patient. Patient verbalized understanding without further questions. Snacks and fluids provided. Opportunities for questions or concerns presented to patient. Patient encouraged to continue to work on treatment goals. Labs, vital signs and patient behavior monitored throughout shift. Patient safety maintained with q15 min safety checks. Low fall risk precautions in place and reviewed with patient; patient verbalized understanding.  R) Patient receptive to interaction with nurse. Patient remains safe on the unit at this time. Patient denies any adverse medication reactions at this time. Patient is resting in bed without complaints. Will continue to monitor.

## 2017-11-24 NOTE — BHH Counselor (Signed)
Adult Comprehensive Assessment  Patient ID: Jacquelin HawkingChristopher Adam Parrott, male   DOB: 07/08/1986, 31 y.o.   MRN: [id number]  Information Source: Information source: Patient  Current Stressors:  Educational / Learning stressors: Denies stressors Employment / Job issues: Denies stressors Family Relationships: Denies Chief Technology Officerstressors Financial / Lack of resources (include bankruptcy): Denies stressors Housing / Lack of housing: Denies stressors Physical health (include injuries & life threatening diseases): Denies stressors Social relationships: Denies stressors Substance abuse: Denies stressors Bereavement / Loss: Denies stressors  Living/Environment/Situation:  Living Arrangements: Alone Living conditions (as described by patient or guardian): Good, house How long has patient lived in current situation?: 2 years What is atmosphere in current home: Comfortable, Supportive  Family History:  Marital status: Single Are you sexually active?: Yes What is your sexual orientation?: Heterosexual  Has your sexual activity been affected by drugs, alcohol, medication, or emotional stress?: N/A  Does patient have children?: No  Childhood History:  By whom was/is the patient raised?: Mother Description of patient's relationship with caregiver when they were a child: Mother - fine; Father - fine Patient's description of current relationship with people who raised him/her: Mother - "cool still", Father - cool How were you disciplined when you got in trouble as a child/adolescent?: "They ain't did nothing." Does patient have siblings?: Yes Number of Siblings: 1 Description of patient's current relationship with siblings: Brother - get along "cool" Did patient suffer any verbal/emotional/physical/sexual abuse as a child?: No Did patient suffer from severe childhood neglect?: No Has patient ever been sexually abused/assaulted/raped as an adolescent or adult?: No Was the patient ever a victim of a crime or a  disaster?: No Witnessed domestic violence?: No Has patient been effected by domestic violence as an adult?: No  Education:  Highest grade of school patient has completed: 12th grade. 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 Has patient ever been in the [employer] and Companymilitary?: No Has patient ever served in combat?: No Did You Receive Any Psychiatric Treatment/Services While in Equities traderthe Military?: No Are There Guns or Other Weapons in Your Home?: No  Financial Resources:   Financial resources: Income from employment, Scientist, research (medical)eceives workman's compensation Does patient have a Lawyerrepresentative payee or guardian?: No  Alcohol/Substance Abuse:   What has been your use of drugs/alcohol within the last 12 months?: Cigarettes  If attempted suicide, did drugs/alcohol play a role in this?: No Alcohol/Substance Abuse Treatment Hx: Denies past history Has alcohol/substance abuse ever caused legal problems?: No  Social Support System:   Conservation officer, natureatient's Community Support System: Poor Describe Community Support System: "Me" Type of faith/religion: None How does patient's faith help to cope with current illness?: N/A  Leisure/Recreation:   Leisure and Hobbies: Plays sports  Strengths/Needs:   What things does the patient do well?: Football, basketball  Discharge Plan:   Does patient have access to transportation?: Yes Will patient be returning to same living situation after discharge?: Yes Currently receiving community mental health services: No If no, would patient like referral for services when discharged?: Yes (What county?)([name] ) Does  patient have financial barriers related to discharge medications?: Yes Patient description of barriers related to discharge medications: Limited  income, no insurance   Summary/Recommendations:   Summary and Recommendations (to be completed by the evaluator): [place] is a 31 year old, African American male who is diagnosed with Schizophrenia. [name] presented to the hospital requesting treCristal Deeratment for his auditory hallucinations. The patient denied any suicidal or homicidal ideation. During the assessment, [name] was pleasant and cooperative with providing information., however he did seem to be thought blocking as the conversation continued. [name] reports that he came into the hospital because his "voices" became worse since [facility]nt [facility] He states that [facility], [facility] were [facility] for the holidays. [name] states that while he is in the hospital, he would like to be stabilied on his medications to help with the voices. He also reports that [facility]. [name] can benefit from crisis stabilization, medication management, therapeutic milieu and referrals services.   Ida Rogueodney B Deshante Cassell. 11/24/2017

## 2017-11-25 NOTE — Progress Notes (Signed)
Nursing Progress Note 1900-0730  D) Patient presents with blunted affect. Patient denies concerns and is compliant with scheduled medications. Patient denies SI/HI or pain. Patient endorses auditory hallucinations. Patient contracts for safety on the unit. Patient attended group this evening. Patient states he is going home tomorrow and "needs to get back to work".  A) Emotional support given. 1:1 interaction and active listening provided. Patient medicated as prescribed. Medications and plan of care reviewed with patient. Patient verbalized understanding without further questions. Snacks and fluids provided. Opportunities for questions or concerns presented to patient. Patient encouraged to continue to work on treatment goals. Labs, vital signs and patient behavior monitored throughout shift. Patient safety maintained with q15 min safety checks. Low fall risk precautions in place and reviewed with patient; patient verbalized understanding.  R) Patient receptive to interaction with nurse. Patient remains safe on the unit at this time. Patient denies any adverse medication reactions at this time. Patient is resting in bed without complaints. Will continue to monitor.

## 2017-11-25 NOTE — Progress Notes (Signed)
Pt observed in dayroom for most of the afternoon. Encouraged pt to go outside with group, but pt chose to stay behind- napping in dayroom

## 2017-11-25 NOTE — Progress Notes (Signed)
D. Pt presents with an anxious affect and guarded behavior.  Pt currently denies SI/HI and AVH and pain.  A. Labs and vitals monitored. Pt compliant with medications. Pt supported emotionally and encouraged to express concerns and ask questions.   R. Pt remains safe with 15 minute checks. Will continue POC.

## 2017-11-25 NOTE — [facility] Group Notes (Signed)
[facility] Group Notes: (Clinical Social Work)   11/25/2017      Type of Therapy:  Group Therapy   Participation Level:  Did Not Attend despite MHT prompting   Porschea Borys Grossman-Orr, LCSW 11/25/2017, 11:25 AM     

## 2017-11-25 NOTE — Progress Notes (Signed)
Patient attended wrap-up group,but did not participate.  

## 2017-11-25 NOTE — Progress Notes (Signed)
[facility] MD Progress Note  11/25/2017 11:45 AM Cole Barrett  MRN:  [id number]   Subjective:  Patient reports " I feel a lot better". Regarding hallucinations states " the medication is helping , they are really low now ". States that at this time he is not bothered by the hallucinations. Denies SI. Presents future oriented,and today expressing interest in discharging soon.    Objective: I have reviewed chart note and have met with patient . Chart notes indicate patient improved but blunted in affect. It was suspected he had been banging on walls in his room but he denies . At this time presents calm, pleasant and polite on approach, without psychomotor agitation. Of note, tolerates only relatively short sessions, and states " OK , I will see you tomorrow" soon after session starts, but is not agitated or hostile. As above, reports much improved auditory hallucinations, does not appear internally preoccupied . Denies medication side effects and reports much improved hallucinations. As he improves, he is becoming more future oriented and currently expressing hope to be discharged soon- states he plans to return home.     Principal Problem: MDD (major depressive disorder), recurrent, severe, with psychosis ([place]) Diagnosis:   Patient Active Problem List   Diagnosis Date Noted  . MDD (major depressive disorder), recurrent, severe, with psychosis ([place]) [F33.3] 11/21/2017  . MDD (major depressive disorder) [F32.9] 09/22/2017  . Alcohol abuse [F10.10]   . Auditory hallucinations [R44.0]   . Alcohol abuse with alcohol-induced mood disorder ([place]) [F10.14] 09/08/2016  . Adjustment disorder with depressed mood [F43.21] 09/07/2016  . Homelessness [Z59.0] 09/03/2016  . Person feigning illness [Z76.5] 09/03/2016  . 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 Cannabis use disorder, mild, abuse [F12.10] 08/29/2016  . Pulmonary emboli ([place]) [I26.99] 07/13/2016  . Pulmonary embolism and  infarction ([place]) [I26.99] 01/02/2014  . Acute left flank pain [R10.9] 01/02/2014  . Pulmonary embolism, bilateral ([place]) [I26.99] 01/02/2014   Total Time spent with patient: 15 minutes  Past Psychiatric History: See H&P  Past Medical History:  Past Medical History:  Diagnosis Date  . Homelessness   . Hypercholesterolemia   . Mental disorder   . Paranoid behavior ([place])   . PE (pulmonary embolism)   . Schizophrenia ([place])    History reviewed. No pertinent surgical history. Family History:  Family History  Problem Relation Age of Onset  . Hypertension Mother    Family Psychiatric  History: See H&P Social History:  Social History   Substance and Sexual Activity  Alcohol Use Yes   Comment: socially     Social History   Substance and Sexual Activity  Drug Use Yes  . Types: Marijuana   Comment: Once every 2-3 months, Last used: Last month    Social History   Socioeconomic History  . Marital status: Single    Spouse name: None  . Number of children: None  . Years of education: None  . Highest education level: None  Social Needs  . Financial resource strain: None  . Food insecurity - worry: None  . Food insecurity - inability: None  . Transportation needs - medical: None  . Transportation needs - non-medical: None  Occupational History  . None  Tobacco Use  . Smoking status: Current Every Day Smoker    Packs/day: 0.50    Types: Cigarettes  . Smokeless tobacco: Never Used  Substance and Sexual Activity  . Alcohol use: Yes    Comment: socially  . Drug use: Yes  Types: Marijuana    Comment: Once every 2-3 months, Last used: Last month  . Sexual activity: No  Other Topics Concern  . None  Social History Narrative  . None   Additional Social History:   Sleep: Good  Appetite:  improved  Current Medications: Current Facility-Administered Medications  Medication Dose Route Frequency Provider Last Rate Last Dose  . acetaminophen (TYLENOL) tablet 650 mg   650 mg Oral Q6H PRN Withrow, Elyse Jarvis, FNP      . alum & mag hydroxide-simeth (MAALOX/MYLANTA) 200-200-20 MG/5ML suspension 30 mL  30 mL Oral Q4H PRN Withrow, John C, FNP      . benztropine (COGENTIN) tablet 0.5 mg  0.5 mg Oral QHS Sheng Pritz, Myer Peer, MD   0.5 mg at 11/24/17 2049  . FLUoxetine (PROZAC) capsule 20 mg  20 mg Oral Daily Withrow, Elyse Jarvis, FNP   20 mg at 11/25/17 7628  . hydrOXYzine (ATARAX/VISTARIL) tablet 25 mg  25 mg Oral Q6H PRN Withrow, John C, FNP      . magnesium hydroxide (MILK OF MAGNESIA) suspension 30 mL  30 mL Oral Daily PRN Withrow, Elyse Jarvis, FNP      . risperiDONE (RISPERDAL) tablet 2 mg  2 mg Oral BID [place] Welliver, Myer Peer, MD   2 mg at 11/25/17 0907  . traZODone (DESYREL) tablet 50 mg  50 mg Oral QHS PRN Ailed Defibaugh, Myer Peer, MD   50 mg at 11/24/17 2049    Lab Results:  Results for orders placed or performed during the hospital encounter of 11/21/17 (from the past 48 hour(s))  TSH     Status: None   Collection Time: 11/23/17  6:29 PM  Result Value Ref Range   TSH 1.755 0.350 - 4.500 uIU/mL    Comment: Performed by a 3rd Generation assay with a functional sensitivity of <=0.01 uIU/mL. Performed at [facility], [place] [address] [place],  [address]     Blood Alcohol level:  Lab Results  Component Value Date   ETH <10 11/20/2017   ETH <10 61/60/7371    Metabolic Disorder Labs: Lab Results  Component Value Date   HGBA1C 5.2 08/27/2016   MPG 103 08/27/2016   Lab Results  Component Value Date   PROLACTIN 34.1 (H) 08/27/2016   Lab Results  Component Value Date   CHOL 160 08/27/2016   TRIG 199 (H) 08/27/2016   HDL 39 (L) 08/27/2016   CHOLHDL 4.1 08/27/2016   VLDL 40 08/27/2016   LDLCALC 81 08/27/2016   LDLCALC 124 (H) 03/13/2014    Physical Findings: AIMS: Facial and Oral Movements Muscles of Facial Expression: None, normal Lips and Perioral Area: None, normal Jaw: None, normal Tongue: None, normal,Extremity Movements Upper (arms,  wrists, hands, fingers): None, normal Lower (legs, knees, ankles, toes): None, normal, Trunk Movements Neck, shoulders, hips: None, normal, Overall Severity Severity of abnormal movements (highest score from questions above): None, normal Incapacitation due to abnormal movements: None, normal Patient's awareness of abnormal movements (rate only patient's report): No Awareness, Dental Status Current problems with teeth and/or dentures?: No Does patient usually wear dentures?: No  CIWA:    COWS:     Musculoskeletal: Strength & Muscle Tone: within normal limits no akathisia or cog wheeling noted  Gait & Station: normal Patient leans: N/A  Psychiatric Specialty Exam: Physical Exam  Nursing note and vitals reviewed. Constitutional: He is oriented to person, place, and time. He appears well-developed and well-nourished.  Cardiovascular: Normal rate.  Respiratory: Effort normal.  Musculoskeletal: Normal range of motion.  Neurological: He is alert and oriented to person, place, and time.  Skin: Skin is warm.    Review of Systems  Constitutional: Negative.   HENT: Negative.   Eyes: Negative.   Respiratory: Negative.   Cardiovascular: Negative.   Gastrointestinal: Negative.   Genitourinary: Negative.   Musculoskeletal: Negative.   Skin: Negative.   Neurological: Negative.   Endo/Heme/Allergies: Negative.   Psychiatric/Behavioral: Positive for hallucinations ([place]). Negative for depression and suicidal ideas. The patient is not nervous/anxious.   denies chest pain, no shortness of breath, no vomiting   Blood pressure 121/71, pulse 81, temperature (!) 97.5 F (36.4 C), temperature source Oral, resp. rate 18, height '5\' 10"'$  (1.778 m), weight 103.4 kg (228 lb).Body mass index is 32.71 kg/m.  General Appearance: improving grooming   Eye Contact:  Good  Speech:  Normal Rate  Volume:  Normal  Mood:  improved, currently denies feeling depressed   Affect:  smiles at times  appropriately  Thought Process:  Linear and Descriptions of Associations: Intact no tangentiality noted today  Orientation:  Other:  fully alert and attentive   Thought Content:  decreased hallucinations, states less audible today and that he does not feel bothered by them today, no delusions expressed, not internally preoccupied  Suicidal Thoughts:  No denies suicidal or self injurious ideations, denies homicidal or violent ideations   Homicidal Thoughts:  No  Memory:  recent and remote fair   Judgement:  Fair- improving   Insight:  Fair- improving   Psychomotor Activity:  Normal  Concentration:  Concentration: Good and Attention Span: Good  Recall:  Good  Fund of Knowledge:  Good  Language:  Good  Akathisia:  No  Handed:  Right  AIMS (if indicated):     Assets:  Communication Skills Desire for Improvement Financial Resources/Insurance Physical Health Social Support  ADL's:  Improving   Cognition:  WNL  Sleep:  Number of Hours: 5   Assessment - patient presents with improving mood , affect , and reports significant reduction in hallucinations, which were his main symptom. Grooming has improved and behavior appears better organized at this time. As he improves he is becoming more future oriented. Thus far tolerating Risperidone titration well. Denies medication side effects.  Treatment Plan Summary: Treatment Plan reviewed as below today 12/29 Daily contact with patient to assess and evaluate symptoms and progress in treatment, Medication management and Plan is to:  -Continue  Risperidone 2 mgrs BID to address psychosis, mood  -Continue Prozac 20 mg PO Daily for mood/ depression -Continue Cogentin 0.5 mg PO QHS to minimize risk of EPS -Continue Vistaril 25 mg PO Q6H PRN for anxiety -Continue Trazodone 50 mg PO QHS PRN for insomnia -Encourage group therapy participation to work on Radiographer, therapeutic and symptom reduction -Treatment team working on disposition [place], MD 11/25/2017, 11:45 AM   Patient ID: Scot Jun, male   DOB: 04-04-1986, 31 y.o.   MRN: [id number]

## 2017-11-26 DIAGNOSIS — F200 Paranoid schizophrenia: Secondary | ICD-10-CM

## 2017-11-26 LAB — LIPID PANEL
Cholesterol: 250 mg/dL — ABNORMAL HIGH (ref 0–200)
HDL: 44 mg/dL (ref >40)
LDL CALC: 166 mg/dL — AB (ref 0–99)
TRIGLYCERIDES: 201 mg/dL — AB (ref <150)
Total CHOL/HDL Ratio: 5.7 RATIO
VLDL: 40 mg/dL (ref 0–40)

## 2017-11-26 LAB — HEMOGLOBIN A1C
Hgb A1c MFr Bld: 5.3 % (ref 4.8–5.6)
MEAN PLASMA GLUCOSE: 105.41 mg/dL

## 2017-11-26 MED ORDER — FLUOXETINE HCL 20 MG PO CAPS: 20.0000 mg | ORAL_CAPSULE | Freq: Every day | ORAL | 0 refills | Status: DC

## 2017-11-26 MED ORDER — RISPERIDONE 2 MG PO TABS
2.0000 mg | ORAL_TABLET | Freq: Two times a day (BID) | ORAL | 0 refills | Status: DC
Start: 2017-11-26 — End: 2017-12-12

## 2017-11-26 MED ORDER — BENZTROPINE MESYLATE 0.5 MG PO TABS: 0.5000 mg | ORAL_TABLET | Freq: Every day | ORAL | 0 refills | Status: DC

## 2017-11-26 MED ORDER — TRAZODONE HCL 50 MG PO TABS: 50.0000 mg | ORAL_TABLET | Freq: Every evening | ORAL | 0 refills | Status: DC | PRN

## 2017-11-26 MED ORDER — HYDROXYZINE HCL 25 MG PO TABS: 25.0000 mg | ORAL_TABLET | Freq: Four times a day (QID) | ORAL | 0 refills | Status: DC | PRN

## 2017-11-26 NOTE — Progress Notes (Signed)
  [facility] :  Will you be returning to the same living situation after discharge:  Yes,  alone At discharge, do you have transportation home?: Yes,  arranged by patient Do you have the ability to pay for your medications: No.  Release of information consent forms completed and turned in to Medical Records by CSW.  Patient to Follow up at: Follow-up Information    [facility] Follow up on 11/30/2017.   Why:  Hospital follow up appointment is on Jan. 3rd at 9:45am.  Contact information: [address] [place] [address] [phone]           Next level of care provider [facility] Link:no  Safety Planning and Suicide Prevention discussed: No.  Have you used any form of tobacco in the last 30 days? (Cigarettes, Smokeless Tobacco, Cigars, and/or Pipes): Yes  Has patient been referred to the Quitline?: Patient refused referral  Patient has been referred for addiction treatment: N/A  Lynnell ChadMareida J Grossman-Orr, LCSW 11/26/2017, 1:15 PM

## 2017-11-26 NOTE — [facility] Group Notes (Signed)
[facility] Group Notes: (Clinical Social Work)   11/26/2017      Type of Therapy:  Group Therapy   Participation Level:  Did Not Attend despite MHT prompting   Nettye Flegal Grossman-Orr, LCSW 11/26/2017, 12:42 PM     

## 2017-11-26 NOTE — Discharge Summary (Signed)
Physician Discharge Summary Note  Patient:  Cole HawkingChristopher Adam Barrett is an 31 y.o., male MRN:  [id number] DOB:  04/12/1986 Patient phone:  [phone] (home)  Patient address:   [address] [place] [address],  Total Time spent with patient: 45 minutes  Date of Admission:  11/21/2017 Date of Discharge: 11/26/2017  Reason for Admission:   31 year old male , presented to ED voluntarily. He is  known to our unit from prior admission on 10/18. At the time he was admitted for auditory hallucinations and paranoia. He was discharged on Risperidone and Prozac . He reports that he has been experiencing increasing auditory hallucinations , saying things such as " they are going to kill you". He does not express delusional ideations at this time, states " I don't feel paranoid, but those voices are getting so loud ".  Of note, he has been off psychiatric medications for several weeks.  Of note, at this time he denies severe or significant depression Denies drug or alcohol abuse and admission UDS and BAL are negative     Principal Problem: MDD (major depressive disorder), recurrent, severe, with psychosis [facility](HCC) Discharge Diagnoses: Patient Active Problem List   Diagnosis Date Noted  . MDD (major depressive disorder), recurrent, severe, with psychosis (HCC) [F33.3] 11/21/2017    Priority: High  . MDD (major depressive disorder) [F32.9] 09/22/2017  . Alcohol abuse [F10.10]   . Auditory hallucinations [R44.0]   . Alcohol abuse with alcohol-induced mood disorder (HCC) [F10.14] 09/08/2016  . Adjustment disorder with depressed mood [F43.21] 09/07/2016  . Homelessness [Z59.0] 09/03/2016  . Person feigning illness [Z76.5] 09/03/2016  . 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 Cannabis use disorder, mild, abuse [F12.10] 08/29/2016  . Pulmonary emboli (HCC) [I26.99] 07/13/2016  . Pulmonary embolism and infarction (H[facility]) [I26.99] 01/02/2014  . Acute left flank pain [R10.9] 01/02/2014  .  Pulmonary embolism, bilateral (HCC) [I26.99] 01/02/2014    Past Psychiatric History: see H&P  Past Medical History:  Past Medical History:  Diagnosis Date  . Homelessness   . Hypercholesterolemia   . Mental disorder   . Paranoid behavior (HCC)   . PE (pulmonary embolism)   . Schizophrenia (HCC)    History reviewed. No pertinent surgical history. Family History:  Family History  Problem Relation Age of Onset  . Hypertension Mother    Family Psychiatric  History: see H&P Social History:  Social History   Substance and Sexual Activity  Alcohol Use Yes   Comment: socially     Social History   Substance and Sexual Activity  Drug Use Yes  . Types: Marijuana   Comment: Once every 2-3 months, Last used: Last month    Social History   Socioeconomic History  . Marital status: Single    Spouse name: None  . Number of children: None  . Years of education: None  . Highest education level: None  Social Needs  . Financial resource strain: None  . Food insecurity - worry: None  . Food insecurity - inability: None  . Transportation needs - medical: None  . Transportation needs - non-medical: None  Occupational History  . None  Tobacco Use  . Smoking status: Current Every Day Smoker    Packs/day: 0.50    Types: Cigarettes  . Smokeless tobacco: Never Used  Substance and Sexual Activity  . Alcohol use: Yes    Comment: socially  . Drug use: Yes    Types: Marijuana    Comment: Once every 2-3 months,  Last used: Last month  . Sexual activity: No  Other Topics Concern  . None  Social History Narrative  . None    Hospital Course:  Cole Barrett was admitted for MDD (major depressive disorder), recurrent, severe, with psychosis (HCC) , with psychosis and crisis management.  Pt was treated discharged with the medications listed below under Medication List.  Medical problems were identified and treated as needed.  Home medications were restarted as  appropriate.  Improvement was monitored by observation and Cole Barrett 's daily report of symptom reduction.  Emotional and mental status was monitored by daily self-inventory reports completed by Cole Barrett and clinical staff.         Cole Barrett was evaluated by the treatment team for stability and plans for continued recovery upon discharge. Cole Barrett 's motivation was an integral factor for scheduling further treatment. Employment, transportation, bed availability, health status, family support, and any pending legal issues were also considered during hospital stay. Pt was offered further treatment options upon discharge including but not limited to Residential, Intensive Outpatient, and Outpatient treatment.  Cole Barrett will follow up with the services as listed below under Follow Up Information.     Upon completion of this admission the patient was both mentally and medically stable for discharge denying suicidal/homicidal ideation, auditory/visual/tactile hallucinations, delusional thoughts and paranoia.    Cole Barrett responded well to treatment with risperidone, cogentin, prozac, vistaril, and trazodone without adverse effects. Pt demonstrated improvement without reported or observed adverse effects to the point of stability appropriate for outpatient management. Pertinent labs include: cholesterol 250, LDL 166 for which outpatient follow-up is necessary for lab recheck as mentioned below. Reviewed CBC, CMP, BAL, and UDS; all unremarkable aside from noted exceptions.     Physical Findings: AIMS: Facial and Oral Movements Muscles of Facial Expression: None, normal Lips and Perioral Area: None, normal Jaw: None, normal Tongue: None, normal,Extremity Movements Upper (arms, wrists, hands, fingers): None, normal Lower (legs, knees, ankles, toes): None, normal, Trunk Movements Neck, shoulders, hips: None, normal,  Overall Severity Severity of abnormal movements (highest score from questions above): None, normal Incapacitation due to abnormal movements: None, normal Patient's awareness of abnormal movements (rate only patient's report): No Awareness, Dental Status Current problems with teeth and/or dentures?: No Does patient usually wear dentures?: No  CIWA:    COWS:     Musculoskeletal: Strength & Muscle Tone: within normal limits Gait & Station: normal Patient leans: N/A  Psychiatric Specialty Exam: Physical Exam  Review of Systems  Psychiatric/Behavioral: Positive for depression. Negative for hallucinations, substance abuse and suicidal ideas. The patient is nervous/anxious and has insomnia.   All other systems reviewed and are negative.   Blood pressure 107/64, pulse 94, temperature 97.6 F (36.4 C), temperature source Oral, resp. rate 20, height 5\' 10"  (1.778 m), weight 103.4 kg (228 lb).Body mass index is 32.71 kg/m.  SEE MD PSE WITHIN SRA  Have you used any form of tobacco in the last 30 days? (Cigarettes, Smokeless Tobacco, Cigars, and/or Pipes): Yes  Has this patient used any form of tobacco in the last 30 days? (Cigarettes, Smokeless Tobacco, Cigars, and/or Pipes) NO  Blood Alcohol level:  Lab Results  Component Value Date   [facility] <10 11/20/2017   ETH <10 11/18/2017    Metabolic Disorder Labs:  Lab Results  Component Value Date   HGBA1C 5.2 08/27/2016   MPG 103 08/27/2016   Lab Results  Component Value Date  PROLACTIN 34.1 (H) 08/27/2016   Lab Results  Component Value Date   CHOL 250 (H) 11/26/2017   TRIG 201 (H) 11/26/2017   HDL 44 11/26/2017   CHOLHDL 5.7 11/26/2017   VLDL 40 11/26/2017   LDLCALC 166 (H) 11/26/2017   LDLCALC 81 08/27/2016    See Psychiatric Specialty Exam and Suicide Risk Assessment completed by Attending Physician prior to discharge.  Discharge destination:  Home  Is patient on multiple antipsychotic therapies at discharge:  No   Has  Patient had three or more failed trials of antipsychotic monotherapy by history:  No  Recommended Plan for Multiple Antipsychotic Therapies: NA   Allergies as of 11/26/2017      Reactions   Haldol [haloperidol] Shortness Of Breath      Medication List    STOP taking these medications   FLUoxetine 20 MG tablet Commonly known as:  PROZAC Replaced by:  FLUoxetine 20 MG capsule     TAKE these medications     Indication  benztropine 0.5 MG tablet Commonly known as:  COGENTIN Take 1 tablet (0.5 mg total) by mouth at bedtime. What changed:  additional instructions  Indication:  EPS prevention   FLUoxetine 20 MG capsule Commonly known as:  PROZAC Take 1 capsule (20 mg total) by mouth daily. Start taking on:  11/27/2017 Replaces:  FLUoxetine 20 MG tablet  Indication:  Depression   hydrOXYzine 25 MG tablet Commonly known as:  ATARAX/VISTARIL Take 1 tablet (25 mg total) by mouth every 6 (six) hours as needed for anxiety.  Indication:  Feeling Anxious   risperiDONE 2 MG tablet Commonly known as:  RISPERDAL Take 1 tablet (2 mg total) by mouth 2 (two) times daily. What changed:    medication strength  how much to take  when to take this  additional instructions  Indication:  mood stabilization   traZODone 50 MG tablet Commonly known as:  DESYREL Take 1 tablet (50 mg total) by mouth at bedtime as needed for sleep.  Indication:  Trouble Sleeping      Follow-up Information    [facility] Follow up on 11/30/2017.   Why:  Hospital follow up appointment is on Jan. 3rd at 9:45am.  Contact information: [address] [place] [address] [phone]           Follow-up recommendations:  Activity:  As tolerated Diet:  Heart healthy with low sodium.  Comments:  Take all medications as prescribed. Keep all follow-up appointments as scheduled.  Do not consume alcohol or use illegal drugs while on prescription medications. Report any adverse effects from your  medications to your primary care provider promptly.  In the event of recurrent symptoms or worsening symptoms, call 911, a crisis hotline, or go to the nearest emergency department for evaluation.    Signed: Beau FannyWithrow, John C, FNP 11/26/2017, 10:35 AM   Patient seen, Suicide Assessment Completed.  Disposition Plan Reviewed

## 2017-11-26 NOTE — BHH Suicide Risk Assessment (Signed)
[facility]   Principal Problem: MDD (major depressive disorder), recurrent, severe, with psychosis (HCC) Discharge Diagnoses:  Patient Active Problem List   Diagnosis Date Noted  . MDD (major depressive disorder), recurrent, severe, with psychosis (HCC) [F33.3] 11/21/2017  . MDD (major depressive disorder) [F32.9] 09/22/2017  . Alcohol abuse [F10.10]   . Auditory hallucinations [R44.0]   . Alcohol abuse with alcohol-induced mood disorder (HCC) [F10.14] 09/08/2016  . Adjustment disorder with depressed mood [F43.21] 09/07/2016  . Homelessness [Z59.0] 09/03/2016  . Person feigning illness [Z76.5] 09/03/2016  . 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 Cannabis use disorder, mild, abuse [F12.10] 08/29/2016  . Pulmonary emboli (HCC) [I26.99] 07/13/2016  . Pulmonary embolism and infarction (HCC) [I26.99] 01/02/2014  . Acute left flank pain [R10.9] 01/02/2014  . Pulmonary embolism, bilateral (HCC) [I26.99] 01/02/2014    Total Time spent with patient: 30 minutes  Musculoskeletal: Strength & Muscle Tone: within normal limits Gait & Station: normal Patient leans: N/A  Psychiatric Specialty Exam: ROS no headache, no chest pain, no shortness of breath, no vomiting no rash   Blood pressure 107/64, pulse 94, temperature 97.6 F (36.4 C), temperature source Oral, resp. rate 20, height 5\' 10"  (1.778 m), weight 103.4 kg (228 lb).Body mass index is 32.71 kg/m.  General Appearance: improved grooming   Eye Contact::  improved   Speech:  Normal Rate409  Volume:  Normal  Mood:  denies feeling depressed, and states his mood is good today  Affect:  appropriate, smiles and laughs appropriately during session  Thought Process:  Linear and Descriptions of Associations: Intact  Orientation:  Full (Time, Place, and Person)  Thought Content:  no hallucinations, no delusions,states hallucinations have resolved and has not had any hallucinatory experiences today. Does  not appear internally preoccupied   Suicidal Thoughts:  No denies suicidal or self injurious ideations, denies homicidal or violent ideations  Homicidal Thoughts:  No  Memory:  recent and remote grossly intact   Judgement:  Other:  improving   Insight:  improving   Psychomotor Activity:  Normal  Concentration:  Good  Recall:  Good  Fund of Knowledge:Good  Language: Good  Akathisia:  Negative  Handed:  Right  AIMS (if indicated):     Assets:  Communication Skills Desire for Improvement Resilience  Sleep:  Number of Hours: 5  Cognition: WNL  ADL's:  Intact   Mental Status Per Nursing Assessment::   On Admission:     Demographic Factors:  31 year old male, single, no children, employed .  Loss Factors: History of chronic mental illness, medication non compliance   Historical Factors: Reports history of chronic auditory hallucinations, history of  Schizophrenia diagnosis in the past .   Risk Reduction Factors:   Employed and Positive coping skills or problem solving skills  Continued Clinical Symptoms:  At this time patient endorses significant improvement compared to admission presentation. He reports hallucinations have abated and now are completely absent since yesterday.Presents euthymic, denies feeling depressed, affect is appropriate, reactive . Denies SI or self injurious ideations, denies HI, reports history of chronic auditory hallucinations, but at this time denies active psychotic symptoms and does not present internally preoccupied . No delusions expressed . Denies medication side effects . Behavior on unit calm, no agitated or disruptive behaviors, currently calm and pleasant on approach. Patient reports future oriented and states he is looking forward to being able to return to work.  Cognitive Features That Contribute To Risk:  No gross cognitive  deficits noted upon discharge. Is alert , attentive, and oriented x 3    Suicide Risk:  Mild:  Suicidal ideation of  limited frequency, intensity, duration, and specificity.  There are no identifiable plans, no associated intent, mild dysphoria and related symptoms, good self-control (both objective and subjective assessment), few other risk factors, and identifiable protective factors, including available and accessible social support.  Follow-up Information    [facility] Follow up on 11/30/2017.   Why:  Hospital follow up appointment is on Jan. 3rd at 9:45am.  Contact information: [address] [place] [address] [phone]           Plan Of Care/Follow-up recommendations:  Activity:  as tolerated  Diet:  Regular Tests:  NA Other:  See below  Patient is expressing readiness for discharge and there are no current grounds for involuntary commitment Plans to return home  Follow up as above  Craige CottaFernando A Draylen Lobue, MD 11/26/2017, 11:45 AM

## 2017-11-26 NOTE — Progress Notes (Addendum)
D) Pt was d/c to care of self.  Denies SI/HI and denies A/V hallucinations.  Pt. Reports he has a place to stay.  A) Reviewed medications and marked schedule.  Pt. Given samples and reviewed importance of compliance with all medication.Reviewed aftercare appointment twice and asked how he would get there. Pt. Stated he doesn't stay  Pt. Provided bus passes. Reviewed safety plan.  R) Receptive and cooperative.  Escorted to lobby.

## 2017-11-27 LAB — PROLACTIN: Prolactin: 63 ng/mL — ABNORMAL HIGH (ref 4.0–15.2)

## 2017-12-11 ENCOUNTER — Encounter (HOSPITAL_COMMUNITY): Payer: Self-pay

## 2017-12-11 ENCOUNTER — Other Ambulatory Visit: Payer: Self-pay

## 2017-12-11 DIAGNOSIS — R440 Auditory hallucinations: Secondary | ICD-10-CM | POA: Insufficient documentation

## 2017-12-11 DIAGNOSIS — F17210 Nicotine dependence, cigarettes, uncomplicated: Secondary | ICD-10-CM | POA: Insufficient documentation

## 2017-12-11 DIAGNOSIS — Z760 Encounter for issue of repeat prescription: Secondary | ICD-10-CM | POA: Insufficient documentation

## 2017-12-11 NOTE — ED Triage Notes (Signed)
Pt states that he ran out of his psych meds yesterday. Hearing voices saying they are going to harm him, deies SI/HI

## 2017-12-12 ENCOUNTER — Emergency Department (HOSPITAL_COMMUNITY)
Admission: EM | Admit: 2017-12-12 | Discharge: 2017-12-12 | Disposition: A | Discharge status: Home / Self Care | Payer: Self-pay | Attending: Emergency Medicine | Admitting: Emergency Medicine

## 2017-12-12 DIAGNOSIS — R440 Auditory hallucinations: Secondary | ICD-10-CM

## 2017-12-12 DIAGNOSIS — Z760 Encounter for issue of repeat prescription: Secondary | ICD-10-CM

## 2017-12-12 MED ORDER — RISPERIDONE 2 MG PO TABS
2.0000 mg | ORAL_TABLET | Freq: Once | ORAL | Status: AC
Start: 2017-12-12 — End: 2017-12-12
  Administered 2017-12-12: 2 mg via ORAL
  Filled 2017-12-12: qty 1

## 2017-12-12 MED ORDER — HYDROXYZINE HCL 25 MG PO TABS: 25.0000 mg | ORAL_TABLET | Freq: Four times a day (QID) | ORAL | 0 refills | Status: DC | PRN

## 2017-12-12 MED ORDER — FLUOXETINE HCL 20 MG PO CAPS: 20.0000 mg | ORAL_CAPSULE | Freq: Every day | ORAL | 0 refills | Status: DC

## 2017-12-12 MED ORDER — RISPERIDONE 2 MG PO TABS: 2.0000 mg | ORAL_TABLET | Freq: Two times a day (BID) | ORAL | 0 refills | Status: DC

## 2017-12-12 MED ORDER — FLUOXETINE HCL 20 MG PO CAPS
20.0000 mg | ORAL_CAPSULE | Freq: Once | ORAL | Status: AC
  Administered 2017-12-12: 20 mg via ORAL
  Filled 2017-12-12: qty 1

## 2017-12-12 MED ORDER — BENZTROPINE MESYLATE 0.5 MG PO TABS: 0.5000 mg | ORAL_TABLET | Freq: Every day | ORAL | 0 refills | Status: DC

## 2017-12-12 MED ORDER — BENZTROPINE MESYLATE 1 MG PO TABS
0.5000 mg | ORAL_TABLET | Freq: Once | ORAL | Status: AC
  Administered 2017-12-12: 0.5 mg via ORAL
  Filled 2017-12-12: qty 1

## 2017-12-12 MED ORDER — HYDROXYZINE HCL 25 MG PO TABS
25.0000 mg | ORAL_TABLET | Freq: Once | ORAL | Status: AC
Start: 2017-12-12 — End: 2017-12-12
  Administered 2017-12-12: 25 mg via ORAL
  Filled 2017-12-12: qty 1

## 2017-12-12 MED ORDER — TRAZODONE HCL 50 MG PO TABS: 50.0000 mg | ORAL_TABLET | Freq: Every evening | ORAL | 0 refills | Status: DC | PRN

## 2017-12-12 NOTE — ED Provider Notes (Signed)
[facility] EMERGENCY DEPARTMENT Provider Note   CSN: [id number] Arrival date & time: 12/11/17  2157     History   Chief Complaint Chief Complaint  Patient presents with  . Hallucinations  . Medication Refill    HPI Cole Barrett is a 32 y.o. male.  Cole Barrett is a 32 y.o. Male who presents to the emergency department for medication refill.  Patient has a history of schizophrenia and reports that he ran out of his medications yesterday.  He last had his medication yesterday.  He tells me he is hearing voices that he does not like.  He does feel like the medications were helping him.  He denies command hallucinations.  He denies suicidal or homicidal ideations.  He reports he is followed at [facility] and went there last week.  He did not receive any refills for his medications then.  He denies physical complaints.   The history is provided by the patient and medical records. No language interpreter was used.  Medication Refill    Past Medical History:  Diagnosis Date  . Homelessness   . Hypercholesterolemia   . Mental disorder   . Paranoid behavior (HCC)   . PE (pulmonary embolism)   . Schizophrenia [facility])     Patient Active Problem List   Diagnosis Date Noted  . Schizophrenia, paranoid (HCC)   . 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 MDD (major depressive disorder) 09/22/2017  . Alcohol abuse   . Auditory hallucinations   . Alcohol abuse with alcohol-induced mood disorder (HCC) 09/08/2016  . Adjustment disorder with depressed mood 09/07/2016  . Homelessness 09/03/2016  . Person feigning illness 09/03/2016  . 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 Cannabis use disorder, mild, abuse 08/29/2016  . Pulmonary emboli (HCC) 07/13/2016  . 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 Acute left flank pain 01/02/2014  . Pulmonary embolism, bilateral (HCC) 01/02/2014     History reviewed. No pertinent surgical history.     Home Medications    Prior to Admission medications   Medication Sig Start Date End Date Taking? Authorizing Provider  benztropine (COGENTIN) 0.5 MG tablet Take 1 tablet (0.5 mg total) by mouth at bedtime. 12/12/17   Everlene Farrier, PA-C  FLUoxetine (PROZAC) 20 MG capsule Take 1 capsule (20 mg total) by mouth daily. 12/12/17   Everlene Farrier, PA-C  hydrOXYzine (ATARAX/VISTARIL) 25 MG tablet Take 1 tablet (25 mg total) by mouth every 6 (six) hours as needed for anxiety. 12/12/17   Everlene Farrier, PA-C  risperiDONE (RISPERDAL) 2 MG tablet Take 1 tablet (2 mg total) by mouth 2 (two) times daily. 12/12/17   Everlene Farrier, PA-C  traZODone (DESYREL) 50 MG tablet Take 1 tablet (50 mg total) by mouth at bedtime as needed for sleep. 12/12/17   Everlene Farrier, PA-C    Family History Family History  Problem Relation Age of Onset  . Hypertension Mother     Social History Social History   Tobacco Use  . Smoking status: Current Every Day Smoker    Packs/day: 0.50    Types: Cigarettes  . Smokeless tobacco: Never Used  Substance Use Topics  . Alcohol use: Yes    Comment: socially  . Drug use: Yes    Types: Marijuana    Comment: Once every 2-3 months, Last used: Last month     Allergies   Haldol [haloperidol]   Review of Systems Review of Systems  Constitutional: Negative  for fever.  Respiratory: Negative for cough.   Cardiovascular: Negative for chest pain.  Gastrointestinal: Negative for vomiting.  Neurological: Negative for headaches.  Psychiatric/Behavioral: Positive for hallucinations. Negative for confusion, sleep disturbance and suicidal ideas. The patient is not nervous/anxious.      Physical Exam Updated Vital Signs BP 116/73   Pulse 85   Temp 98.2 F (36.8 C) (Oral)   Resp 18   SpO2 99%   Physical Exam  Constitutional: He appears well-developed and well-nourished. No distress.  HENT:  Head:  Normocephalic and atraumatic.  Eyes: Right eye exhibits no discharge. Left eye exhibits no discharge.  Pulmonary/Chest: Effort normal. No respiratory distress.  Abdominal: Soft. There is no tenderness.  Neurological: He is alert. Coordination normal.  Skin: Skin is warm and dry. No rash noted. He is not diaphoretic.  Psychiatric: He has a normal mood and affect. His speech is normal. He expresses no homicidal and no suicidal ideation.  He is calm and cooperative. Very polite. Speech is clear and coherent. He does not appear to be responding to internal stimuli. He denies SI or HI.   Nursing note and vitals reviewed.    ED Treatments / Results  Labs (all labs ordered are listed, but only abnormal results are displayed) Labs Reviewed - No data to display  EKG  EKG Interpretation None       Radiology No results found.  Procedures Procedures (including critical care time)  Medications Ordered in ED Medications  FLUoxetine (PROZAC) capsule 20 mg (not administered)  risperiDONE (RISPERDAL) tablet 2 mg (not administered)  hydrOXYzine (ATARAX/VISTARIL) tablet 25 mg (not administered)  benztropine (COGENTIN) tablet 0.5 mg (not administered)     Initial Impression / Assessment and Plan / ED Course  I have reviewed the triage vital signs and the nursing notes.  Pertinent labs & imaging results that were available during my care of the patient were reviewed by me and considered in my medical decision making (see chart for details).    This is a 32 y.o. Male who presents to the emergency department for medication refill.  Patient has a history of schizophrenia and reports that he ran out of his medications yesterday.  He last had his medication yesterday.  He tells me he is hearing voices that he does not like.  He does feel like the medications were helping him.  He denies command hallucinations.  He denies suicidal or homicidal ideations.  He reports he is followed at [facility] and  went there last week.  He did not receive any refills for his medications then.  He denies physical complaints. On exam the patient is afebrile nontoxic-appearing.  His speech is clear and coherent.  He is very polite.  He is calm and cooperative.  He does not appear to be responding to internal stimuli.  He denies suicidal homicidal ideations.  He does endorse auditory hallucinations. He does not appear to be a danger to himself or others.  Will provide him with dose of his medications here in the emergency department and provide him with a month's refill of his prescriptions.  I encouraged him to follow-up with [facility] for continued management of his medications.  He reports that he will. I advised the patient to follow-up with their primary care provider this week. I advised the patient to return to the emergency department with new or worsening symptoms or new concerns. The patient verbalized understanding and agreement with plan.     Final Clinical Impressions(s) / ED  Diagnoses   Final diagnoses:  Encounter for medication refill  Auditory hallucinations    ED Discharge Orders        Ordered    benztropine (COGENTIN) 0.5 MG tablet  Daily at bedtime     12/12/17 0530    FLUoxetine (PROZAC) 20 MG capsule  Daily     12/12/17 0530    hydrOXYzine (ATARAX/VISTARIL) 25 MG tablet  Every 6 hours PRN     12/12/17 0530    risperiDONE (RISPERDAL) 2 MG tablet  2 times daily     12/12/17 0530    traZODone (DESYREL) 50 MG tablet  At bedtime PRN     12/12/17 0530       Everlene FarrierDansie, Telisa Ohlsen, PA-C 12/12/17 0543    Gilda CreasePollina, Yonatan J, MD 12/12/17 [id number]

## 2017-12-12 NOTE — ED Notes (Signed)
Denies SI/HI. Admits to hearing non-threatening voices. Alert, NAD, calm, interactive, polite, cooperative, appropriate, speech clear.

## 2017-12-12 NOTE — ED Notes (Addendum)
Resting/ sleeping, preoccupied with sleep, NAD, calm, resps e/u, rise and fall of chest noted, repositions self, no dyspnea noted, initial VSS/WDL, lying in h/w stretcher.  EDP into room prior to RN assessment, see MD notes, pending orders.

## 2017-12-12 NOTE — ED Notes (Signed)
Care assumed at time of d/c. Orders received to medicate and d/c.

## 2017-12-13 ENCOUNTER — Emergency Department (HOSPITAL_COMMUNITY)
Admission: EM | Admit: 2017-12-13 | Discharge: 2017-12-14 | Disposition: A | Discharge status: Home / Self Care | Payer: Self-pay | Attending: Emergency Medicine | Admitting: Emergency Medicine

## 2017-12-13 ENCOUNTER — Other Ambulatory Visit: Payer: Self-pay

## 2017-12-13 DIAGNOSIS — F209 Schizophrenia, unspecified: Secondary | ICD-10-CM | POA: Insufficient documentation

## 2017-12-13 DIAGNOSIS — F17210 Nicotine dependence, cigarettes, uncomplicated: Secondary | ICD-10-CM | POA: Insufficient documentation

## 2017-12-13 DIAGNOSIS — R440 Auditory hallucinations: Secondary | ICD-10-CM | POA: Diagnosis present

## 2017-12-13 DIAGNOSIS — R443 Hallucinations, unspecified: Secondary | ICD-10-CM

## 2017-12-13 DIAGNOSIS — E7800 Pure hypercholesterolemia, unspecified: Secondary | ICD-10-CM | POA: Insufficient documentation

## 2017-12-13 DIAGNOSIS — Z86718 Personal history of other venous thrombosis and embolism: Secondary | ICD-10-CM | POA: Insufficient documentation

## 2017-12-13 LAB — COMPREHENSIVE METABOLIC PANEL
ALT: 31 U/L (ref 17–63)
AST: 31 U/L (ref 15–41)
Albumin: 3.9 g/dL (ref 3.5–5.0)
Alkaline Phosphatase: 50 U/L (ref 38–126)
Anion gap: 8 (ref 5–15)
BILIRUBIN TOTAL: 0.6 mg/dL (ref 0.3–1.2)
BUN: 15 mg/dL (ref 6–20)
CO2: 23 mmol/L (ref 22–32)
Calcium: 8.9 mg/dL (ref 8.9–10.3)
Chloride: 105 mmol/L (ref 101–111)
Creatinine, Ser: 1.15 mg/dL (ref 0.61–1.24)
GFR calc Af Amer: >60 mL/min (ref >60)
Glucose, Bld: 108 mg/dL — ABNORMAL HIGH (ref 65–99)
POTASSIUM: 3.4 mmol/L — AB (ref 3.5–5.1)
Sodium: 136 mmol/L (ref 135–145)
TOTAL PROTEIN: 7.3 g/dL (ref 6.5–8.1)

## 2017-12-13 LAB — CBC WITH DIFFERENTIAL/PLATELET
BASOS PCT: 0 %
Basophils Absolute: 0 10*3/uL (ref 0.0–0.1)
EOS ABS: 0.2 10*3/uL (ref 0.0–0.7)
Eosinophils Relative: 5 %
HCT: 37.3 % — ABNORMAL LOW (ref 39.0–52.0)
HEMOGLOBIN: 12.4 g/dL — AB (ref 13.0–17.0)
Lymphocytes Relative: 43 %
Lymphs Abs: 1.9 10*3/uL (ref 0.7–4.0)
MCH: 28.1 pg (ref 26.0–34.0)
MCHC: 33.2 g/dL (ref 30.0–36.0)
MCV: 84.4 fL (ref 78.0–100.0)
Monocytes Absolute: 0.4 10*3/uL (ref 0.1–1.0)
Monocytes Relative: 10 %
NEUTROS PCT: 42 %
Neutro Abs: 1.8 10*3/uL (ref 1.7–7.7)
Platelets: 287 10*3/uL (ref 150–400)
RBC: 4.42 MIL/uL (ref 4.22–5.81)
RDW: 13 % (ref 11.5–15.5)
WBC: 4.3 10*3/uL (ref 4.0–10.5)

## 2017-12-13 LAB — ETHANOL

## 2017-12-13 NOTE — ED Notes (Signed)
Patient denies SI/HI/VH and reports AH. Plan of care discussed. Encouragement and support provided and safety maintain. Q 15 min safety checks remain in place and video monitoring.

## 2017-12-14 ENCOUNTER — Encounter (HOSPITAL_COMMUNITY): Payer: Self-pay | Admitting: Emergency Medicine

## 2017-12-14 LAB — RAPID URINE DRUG SCREEN, HOSP PERFORMED
Amphetamines: NOT DETECTED
BENZODIAZEPINES: NOT DETECTED
Barbiturates: NOT DETECTED
COCAINE: NOT DETECTED
OPIATES: NOT DETECTED
Tetrahydrocannabinol: POSITIVE — AB

## 2017-12-14 MED ORDER — FLUOXETINE HCL 20 MG PO CAPS
20.0000 mg | ORAL_CAPSULE | Freq: Every day | ORAL | Status: DC
  Administered 2017-12-14: 20 mg via ORAL
  Filled 2017-12-14: qty 1

## 2017-12-14 MED ORDER — OLANZAPINE 5 MG PO TABS: 5.0000 mg | ORAL_TABLET | Freq: Once | ORAL | 0 refills | Status: DC

## 2017-12-14 MED ORDER — ALUM & MAG HYDROXIDE-SIMETH 200-200-20 MG/5ML PO SUSP: 30.0000 mL | Freq: Four times a day (QID) | ORAL | Status: DC | PRN

## 2017-12-14 MED ORDER — RISPERIDONE 2 MG PO TABS
2.0000 mg | ORAL_TABLET | Freq: Two times a day (BID) | ORAL | Status: DC
  Administered 2017-12-14: 2 mg via ORAL
  Filled 2017-12-14 (×2): qty 1

## 2017-12-14 MED ORDER — ACETAMINOPHEN 325 MG PO TABS: 650.0000 mg | ORAL_TABLET | ORAL | Status: DC | PRN

## 2017-12-14 MED ORDER — OLANZAPINE 5 MG PO TABS
5.0000 mg | ORAL_TABLET | Freq: Once | ORAL | Status: AC
  Administered 2017-12-14: 5 mg via ORAL
  Filled 2017-12-14: qty 1

## 2017-12-14 MED ORDER — BENZTROPINE MESYLATE 0.5 MG PO TABS
0.5000 mg | ORAL_TABLET | Freq: Every day | ORAL | Status: DC
  Filled 2017-12-14: qty 1

## 2017-12-14 NOTE — BHH Suicide Risk Assessment (Signed)
Suicide Risk Assessment  Discharge Assessment   [facility]   Principal Problem: Auditory hallucinations Discharge Diagnoses:  Patient Active Problem List   Diagnosis Date Noted  . Auditory hallucinations [R44.0]     Priority: Medium  . 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 MDD (major depressive disorder), recurrent, severe, with psychosis (HCC) [F33.3] 11/21/2017  . MDD (major depressive disorder) [F32.9] 09/22/2017  . Alcohol abuse [F10.10]   . Alcohol abuse with alcohol-induced mood disorder (HCC) [F10.14] 09/08/2016  . Adjustment disorder with depressed mood [F43.21] 09/07/2016  . Homelessness [Z59.0] 09/03/2016  . Person feigning illness [Z76.5] 09/03/2016  . 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 Cannabis use disorder, mild, abuse [F12.10] 08/29/2016  . Pulmonary emboli (HCC) [I26.99] 07/13/2016  . Pulmonary embolism and infarction (HCC) [I26.99] 01/02/2014  . Acute left flank pain [R10.9] 01/02/2014  . Pulmonary embolism, bilateral (HCC) [I26.99] 01/02/2014   Pt was seen and chart reviewed with treatment team and Dr Sharma CovertNorman.  Pt denies suicidal/homicidal ideation, denies auditory/visual hallucinations and does not appear to be responding to internal stimuli. Pt stated he has been hearing voices for two months and the police brought him to the hospital for evaluation. [facility] but he feels his meds are not working. Pt will be started on Zyprexa and [facility] and evaluation for effectiveness. Pt is psychiatrically clear for discharge.   Total Time spent with patient: 30 minutes  Musculoskeletal: Strength & Muscle Tone: within normal limits Gait & Station: normal Patient leans: N/A  Psychiatric Specialty Exam:   Blood pressure 120/80, pulse 68, temperature 98 F (36.7 C), temperature source Oral, resp. rate 20, height 5\' 10"  (1.778  m), weight 99.8 kg (220 lb), SpO2 98 %.Body mass index is 31.57 kg/m.  General Appearance: Casual  Eye Contact::  Good  Speech:  Clear and Coherent and Normal Rate409  Volume:  Normal  Mood:  Depressed  Affect:  Congruent and Depressed  Thought Process:  Coherent, Goal Directed and Linear  Orientation:  Full (Time, Place, and Person)  Thought Content:  Logical  Suicidal Thoughts:  No  Homicidal Thoughts:  No  Memory:  Immediate;   Good Recent;   Good Remote;   Fair  Judgement:  Fair  Insight:  Fair  Psychomotor Activity:  Normal  Concentration:  Fair  Recall:  Good  Fund of Knowledge:Good  Language: Good  Akathisia:  No  Handed:  Right  AIMS (if indicated):     Assets:  ArchitectCommunication Skills Financial Resources/Insurance Housing  Sleep:     Cognition: WNL  ADL's:  Intact   Mental Status Per Nursing Assessment::   On Admission:     Demographic Factors:  Male and Low socioeconomic status  Loss Factors: Financial problems/change in socioeconomic status  Historical Factors: Impulsivity  Risk Reduction Factors:   Sense of responsibility to family  Continued Clinical Symptoms:  Depression:   Impulsivity Alcohol/Substance Abuse/Dependencies Schizophrenia:   Paranoid or undifferentiated type More than one psychiatric diagnosis Previous Psychiatric Diagnoses and Treatments  Cognitive Features That Contribute To Risk:  Closed-mindedness    Suicide Risk:  Minimal: No identifiable suicidal ideation.  Patients presenting with no risk factors but with morbid ruminations; may be classified as minimal risk based on the severity of the depressive symptoms    Plan Of Care/Follow-up recommendations:  Activity:  as tolerated Diet:  Heart Healthy  Laveda AbbeLaurie Britton Lien Lyman,  NP 12/14/2017, 11:52 AM

## 2017-12-14 NOTE — BH Assessment (Signed)
BHH Assessment Progress Note  Per Jacqueline Norman, DO, this pt does not require psychiatric hospitalization at this time.  Pt is to be discharged from [facility] with recommendation to follow up with [facility].  This has been included in pt's discharge instructions.  Pt's nurse, Ashley, has been notified.  Cole Maulden, MA Triage Specialist [phone]     

## 2017-12-14 NOTE — ED Notes (Signed)
Dr. Palumbo at bedside. 

## 2017-12-14 NOTE — ED Notes (Signed)
Pt d/c home per MD order. Discharge summary reviewed with pt, pt verbalizes understanding. Pt denies SI/HI/AVH. RX provided. Personal property returned. Pt signed e-signature. Ambulatory off unit.  

## 2017-12-14 NOTE — ED Provider Notes (Signed)
[facility]-EMERGENCY DEPT Provider Note   CSN: [id number] Arrival date & time: 12/13/17  2135     History   Chief Complaint Chief Complaint  Patient presents with  . Hallucinations    HPI Cole Barrett is a 32 y.o. male.  The history is provided by the patient.  Depression  This is a chronic problem. The current episode started more than 1 week ago. The problem occurs constantly. The problem has been gradually worsening. Pertinent negatives include no chest pain, no abdominal pain, no headaches and no shortness of breath. Nothing aggravates the symptoms. Nothing relieves the symptoms. Treatments tried: his meds. The treatment provided no relief.  Hallucinations getting worse since [facility].  States he is taking his meds.  Denies HI.  No VHl.     Past Medical History:  Diagnosis Date  . Homelessness   . Hypercholesterolemia   . Mental disorder   . Paranoid behavior (HCC)   . PE (pulmonary embolism)   . Schizophrenia [facility](HCC)     Patient Active Problem List   Diagnosis Date Noted  . Schizophrenia, paranoid (HCC)   . 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 MDD (major depressive disorder) 09/22/2017  . Alcohol abuse   . Auditory hallucinations   . Alcohol abuse with alcohol-induced mood disorder (HCC) 09/08/2016  . Adjustment disorder with depressed mood 09/07/2016  . Homelessness 09/03/2016  . Person feigning illness 09/03/2016  . 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 Cannabis use disorder, mild, abuse 08/29/2016  . Pulmonary emboli (HCC) 07/13/2016  . 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 Acute left flank pain 01/02/2014  . Pulmonary embolism, bilateral (HCC) 01/02/2014    No past surgical history on file.     Home Medications    Prior to Admission medications   Medication Sig Start Date End Date Taking? Authorizing Provider  benztropine (COGENTIN) 0.5 MG  tablet Take 1 tablet (0.5 mg total) by mouth at bedtime. Patient not taking: Reported on 12/13/2017 12/12/17   Everlene Farrieransie, William, PA-C  FLUoxetine (PROZAC) 20 MG capsule Take 1 capsule (20 mg total) by mouth daily. Patient not taking: Reported on 12/13/2017 12/12/17   Everlene Farrieransie, William, PA-C  hydrOXYzine (ATARAX/VISTARIL) 25 MG tablet Take 1 tablet (25 mg total) by mouth every 6 (six) hours as needed for anxiety. Patient not taking: Reported on 12/13/2017 12/12/17   Everlene Farrieransie, William, PA-C  risperiDONE (RISPERDAL) 2 MG tablet Take 1 tablet (2 mg total) by mouth 2 (two) times daily. Patient not taking: Reported on 12/13/2017 12/12/17   Everlene Farrieransie, William, PA-C  traZODone (DESYREL) 50 MG tablet Take 1 tablet (50 mg total) by mouth at bedtime as needed for sleep. Patient not taking: Reported on 12/13/2017 12/12/17   Everlene Farrieransie, William, PA-C    Family History Family History  Problem Relation Age of Onset  . Hypertension Mother     Social History Social History   Tobacco Use  . Smoking status: Current Every Day Smoker    Packs/day: 0.50    Types: Cigarettes  . Smokeless tobacco: Never Used  Substance Use Topics  . Alcohol use: Yes    Comment: socially  . Drug use: Yes    Types: Marijuana    Comment: Once every 2-3 months, Last used: Last month     Allergies   Haldol [haloperidol]   Review of Systems Review of Systems  Constitutional: Negative for fever.  Respiratory: Negative for shortness of breath.  Cardiovascular: Negative for chest pain.  Gastrointestinal: Negative for abdominal pain.  Genitourinary: Negative for dysuria.  Neurological: Negative for seizures and headaches.  Psychiatric/Behavioral: Positive for depression and hallucinations. Negative for behavioral problems. The patient is not nervous/anxious.   All other systems reviewed and are negative.    Physical Exam Updated Vital Signs BP 139/88 (BP Location: Right Arm)   Pulse 87   Temp 98.3 F (36.8 C) (Oral)   Resp 16    Ht 5\' 10"  (1.778 m)   Wt 99.8 kg (220 lb)   SpO2 99%   BMI 31.57 kg/m   Physical Exam  Constitutional: He is oriented to person, place, and time. He appears well-developed and well-nourished. No distress.  HENT:  Head: Normocephalic and atraumatic.  Mouth/Throat: No oropharyngeal exudate.  Eyes: Conjunctivae are normal. Pupils are equal, round, and reactive to light.  Neck: Normal range of motion. Neck supple.  Cardiovascular: Normal rate, regular rhythm, normal heart sounds and intact distal pulses.  Pulmonary/Chest: Effort normal and breath sounds normal. No stridor. He has no wheezes. He has no rales.  Abdominal: Soft. Bowel sounds are normal. He exhibits no mass. There is no tenderness. There is no rebound and no guarding.  Musculoskeletal: Normal range of motion.  Neurological: He is oriented to person, place, and time.  Skin: Skin is warm and dry. Capillary refill takes less than 2 seconds.  Psychiatric: His speech is not rapid and/or pressured.     ED Treatments / Results   Vitals:   12/13/17 2307  BP: 139/88  Pulse: 87  Resp: 16  Temp: 98.3 F (36.8 C)  SpO2: 99%    Labs (all labs ordered are listed, but only abnormal results are displayed)  Results for orders placed or performed during the hospital encounter of 12/13/17  Comprehensive metabolic panel  Result Value Ref Range   Sodium 136 135 - 145 mmol/L   Potassium 3.4 (L) 3.5 - 5.1 mmol/L   Chloride 105 101 - 111 mmol/L   CO2 23 22 - 32 mmol/L   Glucose, Bld 108 (H) 65 - 99 mg/dL   BUN 15 6 - 20 mg/dL   Creatinine, Ser 6.96 0.61 - 1.24 mg/dL   Calcium 8.9 8.9 - 29.5 mg/dL   Total Protein 7.3 6.5 - 8.1 g/dL   Albumin 3.9 3.5 - 5.0 g/dL   AST 31 15 - 41 U/L   ALT 31 17 - 63 U/L   Alkaline Phosphatase 50 38 - 126 U/L   Total Bilirubin 0.6 0.3 - 1.2 mg/dL   GFR calc non Af Amer >60 >60 mL/min   GFR calc Af Amer >60 >60 mL/min   Anion gap 8 5 - 15  Ethanol  Result Value Ref Range   Alcohol, Ethyl (B)  <10 <10 mg/dL  Urine rapid drug screen (hosp performed)  Result Value Ref Range   Opiates NONE DETECTED NONE DETECTED   Cocaine NONE DETECTED NONE DETECTED   Benzodiazepines NONE DETECTED NONE DETECTED   Amphetamines NONE DETECTED NONE DETECTED   Tetrahydrocannabinol POSITIVE (A) NONE DETECTED   Barbiturates NONE DETECTED NONE DETECTED  CBC with Diff  Result Value Ref Range   WBC 4.3 4.0 - 10.5 K/uL   RBC 4.42 4.22 - 5.81 MIL/uL   Hemoglobin 12.4 (L) 13.0 - 17.0 g/dL   HCT 28.4 (L) 13.2 - 44.0 %   MCV 84.4 78.0 - 100.0 fL   MCH 28.1 26.0 - 34.0 pg   MCHC 33.2 30.0 - 36.0  g/dL   RDW 16.1 09.6 - 04.5 %   Platelets 287 150 - 400 K/uL   Neutrophils Relative % 42 %   Neutro Abs 1.8 1.7 - 7.7 K/uL   Lymphocytes Relative 43 %   Lymphs Abs 1.9 0.7 - 4.0 K/uL   Monocytes Relative 10 %   Monocytes Absolute 0.4 0.1 - 1.0 K/uL   Eosinophils Relative 5 %   Eosinophils Absolute 0.2 0.0 - 0.7 K/uL   Basophils Relative 0 %   Basophils Absolute 0.0 0.0 - 0.1 K/uL   Dg Ankle Complete Right  Result Date: 11/17/2017 CLINICAL DATA:  Injury to the right foot and ankle EXAM: RIGHT ANKLE - COMPLETE 3+ VIEW COMPARISON:  12/23/2016 FINDINGS: No fracture or malalignment. Small plantar calcaneal spur. Mild lateral soft tissue swelling. IMPRESSION: Mild soft tissue swelling.  No definite acute osseous abnormality. Electronically Signed   By: Jasmine Pang M.D.   On: 11/17/2017 14:47    Procedures Procedures (including critical care time)    Final Clinical Impressions(s) / ED Diagnoses   Patient is having ongoing hallucinations.  Medically cleared by me holding orders placed.  Dispo per TTS   Alda Gaultney, MD 12/14/17 4098

## 2017-12-14 NOTE — BH Assessment (Addendum)
Assessment Note  Cole Barrett is an 32 y.o. male who presents to the ED voluntarily. Pt reports he has been experiencing AH that tell him they are going to kill him. Per chart, pt has been seen in the ED multiple times and received inpt hospitalizations c/o the same complaint. Pt states he has been hearing these voices for many years. Pt denies that he is followed by any OPT provider at current. Pt was recently treated at [facility] on 11/21/17 for 5 days c/o similar complaints. Pt denies any other stressors at current. Pt denies SI and denies HI. Pt denies VH and states they are only auditory. Pt was asked if he has any issues related to trouble sleeping or loss of appetite and pt stated "no, they straight." Pt admits to using marijuana and denies any other drug or alcohol use at current.    Per Donell Sievert, PA pt is recommended for overnight observation and to be reassessed in the AM by psychiatry. EDP Dr. Nicanor Alcon, MD and pt's nurse Leslie Andrea, RN have been advised of the disposition.   Diagnosis: Schizophrenia; Cannabis Use Disorder   Past Medical History:  Past Medical History:  Diagnosis Date  . Homelessness   . Hypercholesterolemia   . Mental disorder   . Paranoid behavior (HCC)   . PE (pulmonary embolism)   . Schizophrenia (HCC)     History reviewed. No pertinent surgical history.  Family History:  Family History  Problem Relation Age of Onset  . Hypertension Mother     Social History:  reports that he has been smoking cigarettes.  He has been smoking about 0.50 packs per day. he has never used smokeless tobacco. He reports that he drinks alcohol. He reports that he uses drugs. Drug: Marijuana.  Additional Social History:  Alcohol / Drug Use Pain Medications: See MAR Prescriptions: See MAR Over the Counter: See MAR History of alcohol / drug use?: Yes Longest period of sobriety (when/how long): less than 2 months  Substance #1 Name of Substance 1: Cannabis 1 - Age of  First Use: 16 1 - Amount (size/oz): varies 1 - Frequency: daily 1 - Duration: ongoing 1 - Last Use / Amount: 12/13/17  CIWA: CIWA-Ar BP: 139/88 Pulse Rate: 87 COWS:    Allergies:  Allergies  Allergen Reactions  . Haldol [Haloperidol] Shortness Of Breath    Home Medications:  (Not in a hospital admission)  OB/GYN Status:  No LMP for male patient.  General Assessment Data Location of Assessment: [facility] ED TTS Assessment: In system Is this a Tele or Face-to-Face Assessment?: Face-to-Face Is this an Initial Assessment or a Re-assessment for this encounter?: Initial Assessment Marital status: Single Is patient pregnant?: No Pregnancy Status: No Living Arrangements: Alone Can pt return to current living arrangement?: Yes Admission Status: Voluntary Is patient capable of signing voluntary admission?: Yes Referral Source: Self/Family/Friend Insurance type: none     Crisis Care Plan Living Arrangements: Alone Name of Psychiatrist: none Name of Therapist: none  Education Status Is patient currently in school?: No Highest grade of school patient has completed: 12th grade.  Risk to self with the past 6 months Suicidal Ideation: No Has patient been a risk to self within the past 6 months prior to admission? : No Suicidal Intent: No Has patient had any suicidal intent within the past 6 months prior to admission? : No Is patient at risk for suicide?: No Suicidal Plan?: No Has patient had any suicidal plan within the past 6 months prior  to admission? : No Access to Means: No What has been your use of drugs/alcohol within the last 12 months?: admits to using marijuana but states it is "not that much" Previous Attempts/Gestures: No Triggers for Past Attempts: None known Intentional Self Injurious Behavior: None Family Suicide History: No Recent stressful life event(s): Other (Comment)(AH ) Persecutory voices/beliefs?: No Depression: No Substance abuse history and/or  treatment for substance abuse?: No Suicide prevention information given to non-admitted patients: Not applicable  Risk to Others within the past 6 months Homicidal Ideation: No Does patient have any lifetime risk of violence toward others beyond the six months prior to admission? : No Thoughts of Harm to Others: No Current Homicidal Intent: No Current Homicidal Plan: No Access to Homicidal Means: No History of harm to others?: No Assessment of Violence: None Noted Does patient have access to weapons?: No Criminal Charges Pending?: Yes Describe Pending Criminal Charges: trespassing  Does patient have a court date: Yes Court Date: 01/01/18 Is patient on probation?: No  Psychosis Hallucinations: Auditory Delusions: None noted  Mental Status Report Appearance/Hygiene: In scrubs, Unremarkable Eye Contact: Good Motor Activity: Freedom of movement Speech: Logical/coherent Level of Consciousness: Alert, Drowsy Mood: Euthymic Affect: Appropriate to circumstance Anxiety Level: None Thought Processes: Relevant, Coherent Judgement: Unimpaired Orientation: Person, Time, Place, Situation, Appropriate for developmental age Obsessive Compulsive Thoughts/Behaviors: None  Cognitive Functioning Concentration: Normal Memory: Recent Intact, Remote Intact IQ: Average Insight: Good Impulse Control: Good Appetite: Good Sleep: No Change Total Hours of Sleep: 8 Vegetative Symptoms: None  ADLScreening [facility] Assessment Services) Patient's cognitive ability adequate to safely complete daily activities?: Yes Patient able to express need for assistance with ADLs?: Yes Independently performs ADLs?: Yes (appropriate for developmental age)  Prior Inpatient Therapy Prior Inpatient Therapy: Yes Prior Therapy Dates: Per chart, 2017. Oct and Dec 2018 [facility]  Reason for Treatment: alcohol treatment and Schizophrenia.  Prior Outpatient Therapy Prior  Outpatient Therapy: Yes Prior Therapy Dates: 2018 Prior Therapy Facilty/Provider(s): [facility] Reason for Treatment: med management Does patient have an ACCT team?: Unknown Does patient have Intensive In-House Services?  : Unknown Does patient have [facility] services? : Unknown Does patient have P4CC services?: Unknown  ADL Screening (condition at time of admission) Patient's cognitive ability adequate to safely complete daily activities?: Yes Is the patient deaf or have difficulty hearing?: No Does the patient have difficulty seeing, even when wearing glasses/contacts?: No Does the patient have difficulty concentrating, remembering, or making decisions?: No Patient able to express need for assistance with ADLs?: Yes Does the patient have difficulty dressing or bathing?: No Independently performs ADLs?: Yes (appropriate for developmental age) Does the patient have difficulty walking or climbing stairs?: No Weakness of Legs: None Weakness of Arms/Hands: None  Home Assistive Devices/Equipment Home Assistive Devices/Equipment: None    Abuse/Neglect Assessment (Assessment to be complete while patient is alone) Abuse/Neglect Assessment Can Be Completed: Yes Physical Abuse: Denies Verbal Abuse: Denies Sexual Abuse: Denies Exploitation of patient/patient's resources: Denies Self-Neglect: Denies     Merchant navy officerAdvance Directives (For Healthcare) Does Patient Have a Medical Advance Directive?: No Would patient like information on creating a medical advance directive?: No - Patient declined    Additional Information 1:1 In Past 12 Months?: No CIRT Risk: No Elopement Risk: No Does patient have medical clearance?: Yes     Disposition: Per Donell SievertSpencer Simon, PA pt is recommended for overnight observation and to be reassessed in the AM by psychiatry. EDP Dr. Nicanor AlconPalumbo, MD and pt's nurse Leslie Andreaashell,  RN have been advised of the disposition.  Disposition Initial Assessment Completed for this Encounter:  Yes Disposition of Patient: Re-evaluation by Psychiatry recommended(per Donell Sievert, PA )  On Site Evaluation by:   Reviewed with Physician:    [name] 12/14/2017 1:44 AM

## 2017-12-14 NOTE — Discharge Instructions (Signed)
For your mental health needs, you are advised to follow up with [facility].  New and returning patients are seen at their walk-in clinic.  Walk-in hours are Monday - Friday from 8:00 am - 3:00 pm.  Walk-in patients are seen on a first come, first served basis.  Try to arrive as early as possible for he best chance of being seen the same day: ° °     [facility] °     [address] °     [place], [place] [address] °     [phone] °

## 2017-12-14 NOTE — ED Notes (Signed)
Bed: WBH41 Expected date:  Expected time:  Means of arrival:  Comments: 

## 2017-12-14 NOTE — BH Assessment (Signed)
[facility] Assessment Progress Note  Per Donell SievertSpencer Simon, PA pt is recommended for overnight observation and to be reassessed in the AM by psychiatry. EDP Dr. Nicanor AlconPalumbo, MD and pt's nurse Leslie Andreaashell, RN have been advised of the disposition.   Princess BruinsAquicha Lisvet Rasheed, MSW, LCSW Therapeutic Triage Specialist  [phone]

## 2017-12-16 ENCOUNTER — Encounter (HOSPITAL_COMMUNITY): Payer: Self-pay | Admitting: Emergency Medicine

## 2017-12-16 ENCOUNTER — Other Ambulatory Visit: Payer: Self-pay

## 2017-12-16 ENCOUNTER — Emergency Department (HOSPITAL_COMMUNITY)
Admission: EM | Admit: 2017-12-16 | Discharge: 2017-12-17 | Disposition: A | Discharge status: Home / Self Care | Payer: Self-pay | Attending: Emergency Medicine | Admitting: Emergency Medicine

## 2017-12-16 DIAGNOSIS — Z590 Homelessness: Secondary | ICD-10-CM | POA: Insufficient documentation

## 2017-12-16 DIAGNOSIS — F209 Schizophrenia, unspecified: Secondary | ICD-10-CM | POA: Insufficient documentation

## 2017-12-16 DIAGNOSIS — F17210 Nicotine dependence, cigarettes, uncomplicated: Secondary | ICD-10-CM | POA: Insufficient documentation

## 2017-12-16 DIAGNOSIS — R440 Auditory hallucinations: Secondary | ICD-10-CM

## 2017-12-16 DIAGNOSIS — Z79899 Other long term (current) drug therapy: Secondary | ICD-10-CM | POA: Insufficient documentation

## 2017-12-16 LAB — COMPREHENSIVE METABOLIC PANEL
ALBUMIN: 4 g/dL (ref 3.5–5.0)
ALT: 34 U/L (ref 17–63)
ANION GAP: 11 (ref 5–15)
AST: 28 U/L (ref 15–41)
Alkaline Phosphatase: 47 U/L (ref 38–126)
BILIRUBIN TOTAL: 0.7 mg/dL (ref 0.3–1.2)
BUN: 11 mg/dL (ref 6–20)
CHLORIDE: 106 mmol/L (ref 101–111)
CO2: 21 mmol/L — AB (ref 22–32)
Calcium: 8.7 mg/dL — ABNORMAL LOW (ref 8.9–10.3)
Creatinine, Ser: 0.82 mg/dL (ref 0.61–1.24)
GFR calc Af Amer: >60 mL/min (ref >60)
GFR calc non Af Amer: >60 mL/min (ref >60)
GLUCOSE: 107 mg/dL — AB (ref 65–99)
POTASSIUM: 3.9 mmol/L (ref 3.5–5.1)
SODIUM: 138 mmol/L (ref 135–145)
TOTAL PROTEIN: 7.1 g/dL (ref 6.5–8.1)

## 2017-12-16 LAB — ETHANOL: Alcohol, Ethyl (B): 58 mg/dL — ABNORMAL HIGH (ref <10)

## 2017-12-16 LAB — CBC
HEMATOCRIT: 40.7 % (ref 39.0–52.0)
Hemoglobin: 13.3 g/dL (ref 13.0–17.0)
MCH: 27.9 pg (ref 26.0–34.0)
MCHC: 32.7 g/dL (ref 30.0–36.0)
MCV: 85.5 fL (ref 78.0–100.0)
Platelets: 276 10*3/uL (ref 150–400)
RBC: 4.76 MIL/uL (ref 4.22–5.81)
RDW: 13.1 % (ref 11.5–15.5)
WBC: 4.6 10*3/uL (ref 4.0–10.5)

## 2017-12-16 LAB — RAPID URINE DRUG SCREEN, HOSP PERFORMED
Amphetamines: NOT DETECTED
BARBITURATES: NOT DETECTED
BENZODIAZEPINES: NOT DETECTED
COCAINE: NOT DETECTED
Opiates: NOT DETECTED
Tetrahydrocannabinol: NOT DETECTED

## 2017-12-16 LAB — SALICYLATE LEVEL: Salicylate Lvl: <7 mg/dL (ref 2.8–30.0)

## 2017-12-16 LAB — ACETAMINOPHEN LEVEL

## 2017-12-16 MED ORDER — VITAMIN B-1 100 MG PO TABS
100.0000 mg | ORAL_TABLET | Freq: Every day | ORAL | Status: DC
  Administered 2017-12-16 – 2017-12-17 (×2): 100 mg via ORAL
  Filled 2017-12-16 (×2): qty 1

## 2017-12-16 MED ORDER — LORAZEPAM 2 MG/ML IJ SOLN: 0.0000 mg | Freq: Four times a day (QID) | INTRAMUSCULAR | Status: DC

## 2017-12-16 MED ORDER — LORAZEPAM 1 MG PO TABS: 0.0000 mg | ORAL_TABLET | Freq: Four times a day (QID) | ORAL | Status: DC

## 2017-12-16 MED ORDER — THIAMINE HCL 100 MG/ML IJ SOLN: 100.0000 mg | Freq: Every day | INTRAMUSCULAR | Status: DC

## 2017-12-16 MED ORDER — LORAZEPAM 2 MG/ML IJ SOLN: 0.0000 mg | Freq: Two times a day (BID) | INTRAMUSCULAR | Status: DC

## 2017-12-16 MED ORDER — LORAZEPAM 1 MG PO TABS: 0.0000 mg | ORAL_TABLET | Freq: Two times a day (BID) | ORAL | Status: DC

## 2017-12-16 NOTE — ED Notes (Addendum)
[facility] burgundy [facility]. Pt noted to w/poor hygiene and body odor. Encouraged pt to shower. Pt alert, oriented, cooperative. Sitter w/pt. Pt aware of need for Sitter.

## 2017-12-16 NOTE — ED Provider Notes (Signed)
[name] EMERGENCY DEPARTMENT Provider Note   CSN: [id number] Arrival date & time: 12/16/17  4098     History   Chief Complaint Chief Complaint  Patient presents with  . Medical Clearance    HPI Cole Barrett is a 32 y.o. male with past medical history of schizophrenia, paranoia, who presents to ED for evaluation of auditory hallucinations for the past day.  He states that he was on his way to work when the he started hearing loud voices in his head.  He denies any suicidal homicidal ideations to me.  States that the voices have spoken to him in the past but never this loudly.  He denies any visual hallucinations.  States he takes medications at home but does not feel that they are working for him.  He denies any new medication ingestions, self injury, drug use.  He reports tobacco and alcohol use.  HPI  Past Medical History:  Diagnosis Date  . Homelessness   . Hypercholesterolemia   . Mental disorder   . Paranoid behavior (HCC)   . PE (pulmonary embolism)   . Schizophrenia [facility])     Patient Active Problem List   Diagnosis Date Noted  . Schizophrenia, paranoid (HCC)   . 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 MDD (major depressive disorder) 09/22/2017  . Alcohol abuse   . Auditory hallucinations   . Alcohol abuse with alcohol-induced mood disorder (HCC) 09/08/2016  . Adjustment disorder with depressed mood 09/07/2016  . Homelessness 09/03/2016  . Person feigning illness 09/03/2016  . 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 Cannabis use disorder, mild, abuse 08/29/2016  . Pulmonary emboli (HCC) 07/13/2016  . 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 Acute left flank pain 01/02/2014  . Pulmonary embolism, bilateral (HCC) 01/02/2014    History reviewed. No pertinent surgical history.     Home Medications    Prior to Admission medications   Medication Sig Start Date  End Date Taking? Authorizing Provider  benztropine (COGENTIN) 0.5 MG tablet Take 1 tablet (0.5 mg total) by mouth at bedtime. Patient not taking: Reported on 12/13/2017 12/12/17   Everlene Farrier, PA-C  FLUoxetine (PROZAC) 20 MG capsule Take 1 capsule (20 mg total) by mouth daily. Patient not taking: Reported on 12/13/2017 12/12/17   Everlene Farrier, PA-C  OLANZapine (ZYPREXA) 5 MG tablet Take 1 tablet (5 mg total) by mouth once for 1 dose. 12/14/17 12/14/17  Laveda Abbe, NP  risperiDONE (RISPERDAL) 2 MG tablet Take 1 tablet (2 mg total) by mouth 2 (two) times daily. Patient not taking: Reported on 12/13/2017 12/12/17   Everlene Farrier, PA-C    Family History Family History  Problem Relation Age of Onset  . Hypertension Mother     Social History Social History   Tobacco Use  . Smoking status: Current Every Day Smoker    Packs/day: 0.50    Types: Cigarettes  . Smokeless tobacco: Never Used  Substance Use Topics  . Alcohol use: Yes    Comment: socially  . Drug use: Yes    Types: Marijuana    Comment: Once every 2-3 months, Last used: Last month     Allergies   Haldol [haloperidol]   Review of Systems Review of Systems  Constitutional: Negative for appetite change, chills and fever.  HENT: Negative for ear pain, rhinorrhea, sneezing and sore throat.   Eyes: Negative for photophobia and visual disturbance.  Respiratory: Negative for cough, chest tightness,  shortness of breath and wheezing.   Cardiovascular: Negative for chest pain and palpitations.  Gastrointestinal: Negative for abdominal pain, blood in stool, constipation, diarrhea, nausea and vomiting.  Genitourinary: Negative for dysuria, hematuria and urgency.  Musculoskeletal: Negative for myalgias.  Skin: Negative for rash.  Neurological: Negative for dizziness, weakness and light-headedness.  Psychiatric/Behavioral: Positive for hallucinations. Negative for behavioral problems, decreased concentration,  self-injury, sleep disturbance and suicidal ideas.     Physical Exam Updated Vital Signs BP 120/73 (BP Location: Right Arm)   Pulse 76   Temp 98.3 F (36.8 C) (Oral)   Resp 18   SpO2 99%   Physical Exam  Constitutional: He appears well-developed and well-nourished. No distress.  HENT:  Head: Normocephalic and atraumatic.  Nose: Nose normal.  Eyes: Conjunctivae and EOM are normal. Left eye exhibits no discharge. No scleral icterus.  Neck: Normal range of motion. Neck supple.  Cardiovascular: Normal rate, regular rhythm, normal heart sounds and intact distal pulses. Exam reveals no gallop and no friction rub.  No murmur heard. Pulmonary/Chest: Effort normal and breath sounds normal. No respiratory distress.  Abdominal: Soft. Bowel sounds are normal. He exhibits no distension. There is no tenderness. There is no guarding.  Musculoskeletal: Normal range of motion. He exhibits no edema.  Neurological: He is alert. He exhibits normal muscle tone. Coordination normal.  Skin: Skin is warm and dry. No rash noted.  Psychiatric: His affect is blunt.  Appears to be responding to internal stimuli and stating repeatedly "I had to just come here in the morning, I have to just come in the morning before work, I just have to come in in the morning."  Nursing note and vitals reviewed.    ED Treatments / Results  Labs (all labs ordered are listed, but only abnormal results are displayed) Labs Reviewed  COMPREHENSIVE METABOLIC PANEL - Abnormal; Notable for the following components:      Result Value   CO2 21 (*)    Glucose, Bld 107 (*)    Calcium 8.7 (*)    All other components within normal limits  ETHANOL - Abnormal; Notable for the following components:   Alcohol, Ethyl (B) 58 (*)    All other components within normal limits  ACETAMINOPHEN LEVEL - Abnormal; Notable for the following components:   Acetaminophen (Tylenol), Serum <10 (*)    All other components within normal limits    SALICYLATE LEVEL  CBC  RAPID URINE DRUG SCREEN, HOSP PERFORMED    EKG  EKG Interpretation None       Radiology No results found.  Procedures Procedures (including critical care time)  Medications Ordered in ED Medications - No data to display   Initial Impression / Assessment and Plan / ED Course  I have reviewed the triage vital signs and the nursing notes.  Pertinent labs & imaging results that were available during my care of the patient were reviewed by me and considered in my medical decision making (see chart for details).     Patient presents to ED for evaluation of ongoing auditory hallucinations.  States that the voices are louder than usual.  It appears that patient has been seen and evaluated for similar symptoms with the most recent one being 2 days ago.  He appears overall well.  Medical clearance labs unremarkable.  Urine drug screen is pending at this time but I anticipate similar results to what it was 3 days ago, which is positive for THC.  He is medically cleared for TTS  evaluation.  Due to mildly elevated alcohol level, will start CIWA protocol.  Final Clinical Impressions(s) / ED Diagnoses   Final diagnoses:  None    ED Discharge Orders    None     Portions of this note were generated with Dragon dictation software. Dictation errors may occur despite best attempts at proofreading.    Dietrich Pates, PA-C 12/16/17 1048    Loren Racer, MD 12/17/17 [phone]

## 2017-12-16 NOTE — ED Notes (Signed)
Pt ambulatory to shower. Sitter w/pt.

## 2017-12-16 NOTE — ED Notes (Signed)
Pt noted to be lying on bed w/eyes closed. Respirations even, unlabored. Snorous respirations noted. 

## 2017-12-16 NOTE — ED Notes (Addendum)
Pt has been ambulating back and forth from restroom back to room this afternoon. Pt asked for 2nd dinner tray - advised gets one tray. Voiced understanding.

## 2017-12-16 NOTE — ED Notes (Signed)
No answer from pt in waiting room 

## 2017-12-16 NOTE — ED Notes (Signed)
Returned from shower.

## 2017-12-16 NOTE — ED Notes (Signed)
The patient used the bathroom for 15 minutes in lobby after checking in. Pt opened the door per RN request and loose clothes were taken and placed in a bag. Security screened his pockets/pants/shoes for any harmful objects. Pt given paper scrubs to change into and a urine specimen cup. Pt remains in second bathroom "having a bowel movement." security and sitter with patient.

## 2017-12-16 NOTE — BH Assessment (Signed)
Assessment Note  Cole HawkingChristopher Adam Barrett is an 32 y.o. male., [facility].  Patient reports driving to work when he began experiencing loud voices stating "they are going to kill me."  [facility] AH.  Patient presented orientated x4, mood "little nervous", affect congruent with mood.  Patient denied current SI, however told [facility] Staff the voices told him to kill himself and currently has a Comptrolleritter.  Patient denied HI and VH.  He reports on going AH and reports normally the voices are low and muffed, however this morning they were loud and clear.  Patient reports not feeling safe and wants to be hospitalized for a day or two.  Patient reports not [facility] after last hospitalization, however he does have plans to go next week.  Patient reports being compliant with prescribed medications since hospital discharge.  Patient reports consuming (2) 40 oz malt liquors along with a 16 oz beer last night.  He reports normally consuming (2) 40 oz malt liquors 2x per week.  Patient reports smoking Cannabis once every 3 to 4 months of a 1/2 blunt.  [facility] for AH.  [facility] and last visit was in 2018, unable to recall the month.  Patient meets inpatient criteria and placement will be sought.  PA-C Idelle LeechKhatri was provided patient's disposition.    Diagnosis:  Schizophrenia;  Alcohol Use Disorder, moderate  Past Medical History:  Past Medical History:  Diagnosis Date  . Homelessness   . Hypercholesterolemia   . Mental disorder   . Paranoid behavior (HCC)   . PE (pulmonary embolism)   . Schizophrenia (HCC)     History reviewed. No pertinent surgical history.  Family History:  Family History  Problem Relation Age of Onset  . Hypertension Mother     Social History:  reports that he has been smoking cigarettes.  He has been  smoking about 0.50 packs per day. he has never used smokeless tobacco. He reports that he drinks alcohol. He reports that he uses drugs. Drug: Marijuana.  Additional Social History:  Alcohol / Drug Use History of alcohol / drug use?: (Patient reports consuming 2 40oz and 1 16 oz beer last night) Longest period of sobriety (when/how long): less than 2 months  Negative Consequences of Use: Personal relationships Substance #1 Name of Substance 1: Cannabis Substance #2 Name of Substance 2: Marijuana 2 - Amount (size/oz): per pt "I smoke 1 blunt" 2 - Frequency: per pt "3-4 times per month" 2 - Duration: unknown 2 - Last Use / Amount: 10/19/17  CIWA: CIWA-Ar BP: 120/73 Pulse Rate: 76 COWS:    Allergies:  Allergies  Allergen Reactions  . Haldol [Haloperidol] Shortness Of Breath    Home Medications:  (Not in a hospital admission)  OB/GYN Status:  No LMP for male patient.  General Assessment Data [facility] Assessment: In system Is this a Tele or Face-to-Face Assessment?: Tele Assessment Is this an Initial Assessment or a Re-assessment for this encounter?: Initial Assessment Marital status: Single Maiden name: N/A Is patient pregnant?: No Pregnancy Status: No Living Arrangements: Alone Can pt return to current living arrangement?: Yes Admission Status: Voluntary Is patient capable of signing voluntary admission?: Yes Referral Source: Self/Family/Friend Insurance type: Self pay  Medical Screening Exam [facility] ONLY) Medical Exam completed: Yes  Crisis Care Plan  Living Arrangements: Alone Legal Guardian: Other:(self) Name of Psychiatrist: none Name of Therapist: none  Education Status Is patient currently in school?: No Current Grade: N/A Highest grade of school patient has completed: 12th grade. Name of school: NA Contact person: NA  Risk to self with the past 6 months Suicidal Ideation: No(Patient denied, told ED Staff he is  experiencing SI) Has patient been a risk to self within the past 6 months prior to admission? : Yes Suicidal Intent: No-Not Currently/Within Last 6 Months Has patient had any suicidal intent within the past 6 months prior to admission? : No Is patient at risk for suicide?: Yes Suicidal Plan?: No Has patient had any suicidal plan within the past 6 months prior to admission? : No Access to Means: No What has been your use of drugs/alcohol within the last 12 months?: alcohol and Cannabis Previous Attempts/Gestures: No How many times?: 0 Other Self Harm Risks: None Triggers for Past Attempts: None known Intentional Self Injurious Behavior: None Family Suicide History: No Recent stressful life event(s): (Patient denied) Persecutory voices/beliefs?: Yes Depression: No Depression Symptoms: (Patient denied) Substance abuse history and/or treatment for substance abuse?: Yes(alcohol and Cannabis) Suicide prevention information given to non-admitted patients: Not applicable  Risk to Others within the past 6 months Homicidal Ideation: No Does patient have any lifetime risk of violence toward others beyond the six months prior to admission? : No Thoughts of Harm to Others: No Current Homicidal Intent: No Current Homicidal Plan: No Access to Homicidal Means: No Identified Victim: N/A History of harm to others?: No Assessment of Violence: None Noted Violent Behavior Description: N/A Does patient have access to weapons?: No Criminal Charges Pending?: No(Patient denied) Describe Pending Criminal Charges: N/A Does patient have a court date: No Is patient on probation?: No  Psychosis Delusions: None noted  Mental Status Report Appearance/Hygiene: In scrubs Eye Contact: Fair Motor Activity: Unremarkable Speech: Logical/coherent Level of Consciousness: Alert Mood: Anxious Affect: Anxious Anxiety Level: Moderate Thought Processes: Coherent, Relevant Judgement: Impaired Orientation:  Person, Place, Time, Situation Obsessive Compulsive Thoughts/Behaviors: None  Cognitive Functioning Concentration: Decreased Memory: Recent Intact, Remote Intact IQ: Average Insight: Poor Impulse Control: Poor Appetite: Good Weight Loss: 0 Weight Gain: 0 Sleep: No Change Total Hours of Sleep: 8 Vegetative Symptoms: None  ADLScreening [facility]) Patient's cognitive ability adequate to safely complete daily activities?: Yes Patient able to express need for assistance with ADLs?: Yes Independently performs ADLs?: Yes (appropriate for developmental age)  Prior Inpatient Therapy Prior Inpatient Therapy: Yes Prior Therapy Dates: Per chart, 2017. Oct and Dec 2018 Prior Therapy Facilty/Provider(s): Per chart, [facility]  Reason for Treatment: alcohol treatment and Schizophrenia.  Prior Outpatient Therapy Prior Outpatient Therapy: Yes Prior Therapy Dates: 2018 Prior Therapy Facilty/Provider(s): [facility] Reason for Treatment: med management Does patient have an ACCT team?: No Does patient have Intensive In-House Services?  : No Does patient have [facility] services? : Yes Does patient have P4CC services?: No  ADL Screening (condition at time of admission) Patient's cognitive ability adequate to safely complete daily activities?: Yes Is the patient deaf or have difficulty hearing?: Yes Does the patient have difficulty seeing, even when wearing glasses/contacts?: No Does the patient have difficulty concentrating, remembering, or making decisions?: No Patient able to express need for assistance with ADLs?: Yes Does the patient have difficulty dressing or bathing?: No Independently performs ADLs?: Yes (appropriate for developmental age) Weakness of Legs: None Weakness of Arms/Hands: None       Abuse/Neglect Assessment (Assessment to  be complete while patient is alone) Physical Abuse: Denies Verbal Abuse: Denies Exploitation of patient/patient's resources:  Denies Self-Neglect: (S) Denies Values / Beliefs Cultural Requests During Hospitalization: None Spiritual Requests During Hospitalization: None Consults Spiritual Care Consult Needed: No Advance Directives (For Healthcare) Does Patient Have a Medical Advance Directive?: No(Patient psychotic)    Additional Information 1:1 In Past 12 Months?: Yes(Currently) CIRT Risk: No Elopement Risk: No Does patient have medical clearance?: Yes     Disposition:  Disposition Initial Assessment Completed for this Encounter: Yes Disposition of Patient: Inpatient treatment program Type of inpatient treatment program: Adult  On Site Evaluation by:   Reviewed with Physician:    Dey-Chapel,Hallelujah Wysong 12/16/2017 11:41 AM

## 2017-12-16 NOTE — ED Triage Notes (Signed)
The patient was seen 1/15 for same. States he is hearing voices telling him to kill himself. Denies SI/HI. Safety sitter at bedside.

## 2017-12-16 NOTE — ED Notes (Signed)
Pt asking for cheese for snack - advised unable to provide.

## 2017-12-16 NOTE — ED Notes (Signed)
Belongings inventoried - 4 labeled belongings bags [place]. Valuables envelope to security by Tobi BastosAnna, RN.

## 2017-12-16 NOTE — ED Notes (Signed)
Regular Diet ordered for Dinner. 

## 2017-12-17 NOTE — ED Notes (Signed)
Pt aware being d/c'd when EDP completes paperwork. Voiced understanding.

## 2017-12-17 NOTE — ED Notes (Signed)
Pt asking when he will be d/c'd. Advised pt EDP aware and will perform d/c paperwork soon. Voiced understanding.

## 2017-12-17 NOTE — ED Notes (Signed)
Arm band cut and removed from pt's wrist - pt requested to keep it on. Advised pt unable to do so.

## 2017-12-17 NOTE — ED Notes (Signed)
Pt given snack - Lunch order request received.

## 2017-12-17 NOTE — ED Notes (Signed)
Pt noted to be sleeping - snorous respirations noted.

## 2017-12-17 NOTE — Progress Notes (Signed)
Per Leighton Ruffina Okonkwo, NP, pt is appropriate for discharge. CSW contacted pt's nurse, Kriste BasqueBecky, [facility] and informed her of this information at 10:45AM.   Heidi DachKelsey Veniamin Kincaid, MSW, LCSW 12/17/2017 10:46 AM

## 2017-12-17 NOTE — ED Notes (Signed)
Re-TTS being performed.  

## 2017-12-17 NOTE — ED Notes (Signed)
Telepsych being performed. 

## 2017-12-17 NOTE — ED Notes (Signed)
Pt continues to ambulate back and forth from room to bathroom intermittently.

## 2017-12-17 NOTE — ED Provider Notes (Signed)
Received care of patient at 9 AM.  Please see previous notes for history, physical and prior care.  Briefly, this is a 32 year old male with a history of schizophrenia who presented with auditory hallucinations.  TTS evaluated, feel he is appropriate for outpatient follow-up.  Patient reports that his symptoms have improved.  He denies suicidal ideation, homicidal ideation.  Will follow up as outpatient.   Alvira MondaySchlossman, Lossie Kalp, MD 12/17/17 1800

## 2017-12-17 NOTE — Consult Note (Signed)
Telepsych Consultation   Reason for Consult: Schizophrenia Referring Physician:  Delia Heady PA-C Location of Patient: [facility] ED Location of Provider: [facility]  Patient Identification: Cole Barrett MRN:  [id number] Principal Diagnosis: <principal problem not specified> Diagnosis:   Patient Active Problem List   Diagnosis Date Noted  . 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 MDD (major depressive disorder), recurrent, severe, with psychosis ([place]) [F33.3] 11/21/2017  . MDD (major depressive disorder) [F32.9] 09/22/2017  . Alcohol abuse [F10.10]   . Auditory hallucinations [R44.0]   . Alcohol abuse with alcohol-induced mood disorder ([place]) [F10.14] 09/08/2016  . Adjustment disorder with depressed mood [F43.21] 09/07/2016  . Homelessness [Z59.0] 09/03/2016  . Person feigning illness [Z76.5] 09/03/2016  . 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 Cannabis use disorder, mild, abuse [F12.10] 08/29/2016  . Pulmonary emboli ([place]) [I26.99] 07/13/2016  . Pulmonary embolism and infarction ([place]) [I26.99] 01/02/2014  . Acute left flank pain [R10.9] 01/02/2014  . Pulmonary embolism, bilateral ([place]) [I26.99] 01/02/2014    Total Time spent with patient: 30 minutes  Subjective:   Cole Barrett is a 32 y.o. male patient admitted with Schizophrenia;  Alcohol Use Disorder, moderate.  HPI: Per the TTS assessment completed on 12/16/17 by Karlene Einstein Dey-Delsol: Cole Barrett is an 32 y.o. male., who drove himself to [facility].  Patient reports driving to work when he began experiencing loud voices stating "they are going to kill me."  Patient was discharged from [facility] on 12-14-2017 for [employer].  Patient presented orientated x4, mood "little nervous", affect congruent with mood.  Patient denied current SI, however told [facility] Staff the voices told him to kill himself and currently has a Actuary.  Patient denied HI and VH.  He reports on going AH and reports normally the  voices are low and muffed, however this morning they were loud and clear.  Patient reports not feeling safe and wants to be hospitalized for a day or two.  Patient reports not following up with [facility] after last hospitalization, however he does have plans to go next week.  Patient reports being compliant with prescribed medications since hospital discharge.  Patient reports consuming (2) 40 oz malt liquors along with a 16 oz beer last night.  He reports normally consuming (2) 40 oz malt liquors 2x per week.  Patient reports smoking Cannabis once every 3 to 4 months of a 1/2 blunt.  Patient has multiple ED visits for [facility] and 2 hospitalizations at [facility] in 2018 and 2017 for AH.  Patient receives medication management from [facility] and last visit was in 2018, unable to recall the month.   Per TTS reassessment earlier today by Kasandra Knudsen Sprinkle: Patient initially starts the conversation by saying: "I just want to stay in the hospital one more day and be discharged tomorrow. I have been taking medications and the voices are not as loud or fast and I should be alright by tomorrow. I think if I keep taking medications and the doctor monitors me overnight I should be able to go home tomorrow."  Patient denies any thoughts to hurt himself or others. Patient is alert and oriented and he is cooperative.   He states that he is normally seen at [facility], but has not been compliant with appointments.  Claims to be taking his medication as prescribed, but also drinking alcohol and smoking marijuana.  Patient states that he has a residence and states that he has been working at [employer] for the past three years.  Will staff patient with FNP for final disposition.  On Exam: Patient was seen via tele-psych, chart reviewed with treatment team. Patient in bed, awake, alert and oriented x4. Patient reiterated the reason for this hospital admission as documented above. Patient stated, I came to the hospital yesterday because the  voices where talking out loud and fast. But the voices are better today. I just want to stay in the hospital one more day so they can watch me take my medications and I will leave and go to work tomorrow". Patient stated that he missed taking his medication a few time and could be the reason the voices became so loud. Patient stated that he will be ready to be discharged tomorrow. He currently denies any SI/HI and stated that he follows up and gets his medication from [facility]. Patient stated that [facility] recently started him on a new medication but he does no know the name of it. He reports occasional marijuana use but agrees to refrain from use. He denies any other concerns at this time and does not appear to be responding to internal stimuli during this encounter.  Past Psychiatric History: As in H&P  Risk to Self: Suicidal Ideation: No(Patient denied, told ED Staff he is experiencing SI) Suicidal Intent: No-Not Currently/Within Last 6 Months Is patient at risk for suicide?: Yes Suicidal Plan?: No Access to Means: No What has been your use of drugs/alcohol within the last 12 months?: alcohol and Cannabis How many times?: 0 Other Self Harm Risks: None Triggers for Past Attempts: None known Intentional Self Injurious Behavior: None Risk to Others: Homicidal Ideation: No Thoughts of Harm to Others: No Current Homicidal Intent: No Current Homicidal Plan: No Access to Homicidal Means: No Identified Victim: N/A History of harm to others?: No Assessment of Violence: None Noted Violent Behavior Description: N/A Does patient have access to weapons?: No Criminal Charges Pending?: No(Patient denied) Describe Pending Criminal Charges: N/A Does patient have a court date: No Prior Inpatient Therapy: Prior Inpatient Therapy: Yes Prior Therapy Dates: Per chart, 2017. Oct and Dec 2018 Prior Therapy Facilty/Provider(s): Per chart, [facility]  Reason for Treatment: alcohol treatment and  Schizophrenia. Prior Outpatient Therapy: Prior Outpatient Therapy: Yes Prior Therapy Dates: 2018 Prior Therapy Facilty/Provider(s): [facility] Reason for Treatment: med management Does patient have an ACCT team?: No Does patient have Intensive In-House Services?  : No Does patient have [facility] services? : Yes Does patient have P4CC services?: No  Past Medical History:  Past Medical History:  Diagnosis Date  . Homelessness   . Hypercholesterolemia   . Mental disorder   . Paranoid behavior ([place])   . PE (pulmonary embolism)   . Schizophrenia ([place])    History reviewed. No pertinent surgical history. Family History:  Family History  Problem Relation Age of Onset  . Hypertension Mother    Family Psychiatric  History: Unknown  Social History:  Social History   Substance and Sexual Activity  Alcohol Use Yes   Comment: socially     Social History   Substance and Sexual Activity  Drug Use Yes  . Types: Marijuana   Comment: Once every 2-3 months, Last used: Last month    Social History   Socioeconomic History  . Marital status: Single    Spouse name: None  . Number of children: None  . Years of education: None  . Highest education level: None  Social Needs  . Financial resource strain: None  . Food insecurity - worry: None  . Food  insecurity - inability: None  . Transportation needs - medical: None  . Transportation needs - non-medical: None  Occupational History  . None  Tobacco Use  . Smoking status: Current Every Day Smoker    Packs/day: 0.50    Types: Cigarettes  . Smokeless tobacco: Never Used  Substance and Sexual Activity  . Alcohol use: Yes    Comment: socially  . Drug use: Yes    Types: Marijuana    Comment: Once every 2-3 months, Last used: Last month  . Sexual activity: No  Other Topics Concern  . None  Social History Narrative  . None   Additional Social History:    Allergies:   Allergies  Allergen Reactions  . Haldol [Haloperidol]  Shortness Of Breath    Labs:  Results for orders placed or performed during the hospital encounter of 12/16/17 (from the past 48 hour(s))  Comprehensive metabolic panel     Status: Abnormal   Collection Time: 12/16/17  8:00 AM  Result Value Ref Range   Sodium 138 135 - 145 mmol/L   Potassium 3.9 3.5 - 5.1 mmol/L   Chloride 106 101 - 111 mmol/L   CO2 21 (L) 22 - 32 mmol/L   Glucose, Bld 107 (H) 65 - 99 mg/dL   BUN 11 6 - 20 mg/dL   Creatinine, Ser 0.82 0.61 - 1.24 mg/dL   Calcium 8.7 (L) 8.9 - 10.3 mg/dL   Total Protein 7.1 6.5 - 8.1 g/dL   Albumin 4.0 3.5 - 5.0 g/dL   AST 28 15 - 41 U/L   ALT 34 17 - 63 U/L   Alkaline Phosphatase 47 38 - 126 U/L   Total Bilirubin 0.7 0.3 - 1.2 mg/dL   GFR calc non Af Amer >60 >60 mL/min   GFR calc Af Amer >60 >60 mL/min    Comment: (NOTE) The eGFR has been calculated using the CKD EPI equation. This calculation has not been validated in all clinical situations. eGFR's persistently <60 mL/min signify possible Chronic Kidney Disease.    Anion gap 11 5 - 15  Ethanol     Status: Abnormal   Collection Time: 12/16/17  8:00 AM  Result Value Ref Range   Alcohol, Ethyl (B) 58 (H) <10 mg/dL    Comment:        LOWEST DETECTABLE LIMIT FOR SERUM ALCOHOL IS 10 mg/dL FOR MEDICAL PURPOSES ONLY   Salicylate level     Status: None   Collection Time: 12/16/17  8:00 AM  Result Value Ref Range   Salicylate Lvl <6.9 2.8 - 30.0 mg/dL  Acetaminophen level     Status: Abnormal   Collection Time: 12/16/17  8:00 AM  Result Value Ref Range   Acetaminophen (Tylenol), Serum <10 (L) 10 - 30 ug/mL    Comment:        THERAPEUTIC CONCENTRATIONS VARY SIGNIFICANTLY. A RANGE OF 10-30 ug/mL MAY BE AN EFFECTIVE CONCENTRATION FOR MANY PATIENTS. HOWEVER, SOME ARE BEST TREATED AT CONCENTRATIONS OUTSIDE THIS RANGE. ACETAMINOPHEN CONCENTRATIONS >150 ug/mL AT 4 HOURS AFTER INGESTION AND >50 ug/mL AT 12 HOURS AFTER INGESTION ARE OFTEN ASSOCIATED WITH  TOXIC REACTIONS.   cbc     Status: None   Collection Time: 12/16/17  8:00 AM  Result Value Ref Range   WBC 4.6 4.0 - 10.5 K/uL   RBC 4.76 4.22 - 5.81 MIL/uL   Hemoglobin 13.3 13.0 - 17.0 g/dL   HCT 40.7 39.0 - 52.0 %   MCV 85.5 78.0 - 100.0 fL  MCH 27.9 26.0 - 34.0 pg   MCHC 32.7 30.0 - 36.0 g/dL   RDW 13.1 11.5 - 15.5 %   Platelets 276 150 - 400 K/uL  Rapid urine drug screen (hospital performed)     Status: None   Collection Time: 12/16/17  8:00 AM  Result Value Ref Range   Opiates NONE DETECTED NONE DETECTED   Cocaine NONE DETECTED NONE DETECTED   Benzodiazepines NONE DETECTED NONE DETECTED   Amphetamines NONE DETECTED NONE DETECTED   Tetrahydrocannabinol NONE DETECTED NONE DETECTED   Barbiturates NONE DETECTED NONE DETECTED    Comment: (NOTE) DRUG SCREEN FOR MEDICAL PURPOSES ONLY.  IF CONFIRMATION IS NEEDED FOR ANY PURPOSE, NOTIFY LAB WITHIN 5 DAYS. LOWEST DETECTABLE LIMITS FOR URINE DRUG SCREEN Drug Class                     Cutoff (ng/mL) Amphetamine and metabolites    1000 Barbiturate and metabolites    200 Benzodiazepine                 119 Tricyclics and metabolites     300 Opiates and metabolites        300 Cocaine and metabolites        300 THC                            50     Medications:  Current Facility-Administered Medications  Medication Dose Route Frequency Provider Last Rate Last Dose  . [START ON 12/18/2017] LORazepam (ATIVAN) injection 0-4 mg  0-4 mg Intravenous Q12H Khatri, Hina, PA-C       Or  . [START ON 12/18/2017] LORazepam (ATIVAN) tablet 0-4 mg  0-4 mg Oral Q12H Khatri, Hina, PA-C      . LORazepam (ATIVAN) tablet 0-4 mg  0-4 mg Oral Q6H Tanna Furry, MD       Or  . LORazepam (ATIVAN) injection 0-4 mg  0-4 mg Intravenous Q6H Tanna Furry, MD      . thiamine (VITAMIN B-1) tablet 100 mg  100 mg Oral Daily Khatri, Hina, PA-C   100 mg at 12/16/17 1750   Or  . thiamine (B-1) injection 100 mg  100 mg Intravenous Daily Khatri, Hina, PA-C        Current Outpatient Medications  Medication Sig Dispense Refill  . benztropine (COGENTIN) 0.5 MG tablet Take 1 tablet (0.5 mg total) by mouth at bedtime. (Patient not taking: Reported on 12/13/2017) 30 tablet 0  . FLUoxetine (PROZAC) 20 MG capsule Take 1 capsule (20 mg total) by mouth daily. (Patient not taking: Reported on 12/13/2017) 30 capsule 0  . OLANZapine (ZYPREXA) 5 MG tablet Take 1 tablet (5 mg total) by mouth once for 1 dose. 15 tablet 0  . risperiDONE (RISPERDAL) 2 MG tablet Take 1 tablet (2 mg total) by mouth 2 (two) times daily. (Patient not taking: Reported on 12/13/2017) 60 tablet 0    Musculoskeletal: UTA via camera  Psychiatric Specialty Exam: Physical Exam  Nursing note and vitals reviewed.   Review of Systems  Psychiatric/Behavioral: Positive for hallucinations and substance abuse. Negative for depression, memory loss and suicidal ideas. The patient is not nervous/anxious and does not have insomnia.   All other systems reviewed and are negative.   Blood pressure 124/78, pulse 71, temperature 98.7 F (37.1 C), temperature source Oral, resp. rate 18, SpO2 99 %.There is no height or weight on file to calculate BMI.  General  Appearance: on hospital scrub  Eye Contact:  Good  Speech:  Clear and Coherent and Normal Rate  Volume:  Normal  Mood:  euthymic  Affect:  Congruent  Thought Process:  Coherent and Goal Directed  Orientation:  Full (Time, Place, and Person)  Thought Content:  WDL  Suicidal Thoughts:  No  Homicidal Thoughts:  No  Memory:  Immediate;   Good Recent;   Good Remote;   Fair  Judgement:  Good  Insight:  Present  Psychomotor Activity:  Normal  Concentration:  Concentration: Good and Attention Span: Good  Recall:  Good  Fund of Knowledge:  Good  Language:  Good  Akathisia:  Negative  Handed:  Right  AIMS (if indicated):     Assets:  Communication Skills Desire for Improvement Housing Leisure Time Physical Health Social Support  ADL's:   Intact  Cognition:  WNL  Sleep:        Treatment Plan Summary: Plan discharge home  Follow up with [facility] for therapy and medication management Follow up with Social Work consult for Care coordination Take all medications as prescribed Avoid the use of alcohol and/or drugs Stay well hydrated Activity as tolerated Follow up with PCP for any new or existing medical concerns   Disposition: No evidence of imminent risk to self or others at present.   Patient does not meet criteria for psychiatric inpatient admission. Supportive therapy provided about ongoing stressors. Refer to IOP. Discussed crisis plan, support from social network, calling 911, coming to the Emergency Department, and calling Suicide Hotline.  This service was provided via telemedicine using a 2-way, interactive audio and video technology.  Names of all persons participating in this telemedicine service and their role in this encounter. Name: Cole Barrett Role: Patient  Name: Arti Trang A. Laityn Bensen  Role: NP           Vicenta Aly, NP 12/17/2017 10:25 AM

## 2017-12-17 NOTE — BH Assessment (Signed)
BHH Assessment Progress Note   Patient initially starts the conversation by saying: "I just want to stay in the hospital one more day and be discharged tomorrow. I have been taking medications and the voices are not as loud or fast and I should be alright by tomorrow. I think if I keep taking medications and the doctor monitors me overnight I should be able to go home tomorrow."  Patient denies any thoughts to hurt himself or others. Patient is alert and oriented and he is cooperative.   He states that [facility], but has not been compliant with appointments.  Claims to be taking his medication as prescribed, but also drinking alcohol and smoking marijuana.  Patient states that he has a residence and states that he has been working at [employer] for the past three years.  Will staff patient with FNP for final disposition.

## 2017-12-22 ENCOUNTER — Other Ambulatory Visit: Payer: Self-pay

## 2017-12-22 ENCOUNTER — Encounter (HOSPITAL_COMMUNITY): Payer: Self-pay | Admitting: Emergency Medicine

## 2017-12-22 ENCOUNTER — Emergency Department (HOSPITAL_COMMUNITY)
Admission: EM | Admit: 2017-12-22 | Discharge: 2017-12-22 | Disposition: A | Discharge status: Home / Self Care | Payer: Self-pay | Attending: Emergency Medicine | Admitting: Emergency Medicine

## 2017-12-22 DIAGNOSIS — F10151 Alcohol abuse with alcohol-induced psychotic disorder with hallucinations: Secondary | ICD-10-CM | POA: Insufficient documentation

## 2017-12-22 DIAGNOSIS — F333 Major depressive disorder, recurrent, severe with psychotic symptoms: Secondary | ICD-10-CM | POA: Insufficient documentation

## 2017-12-22 DIAGNOSIS — R440 Auditory hallucinations: Secondary | ICD-10-CM

## 2017-12-22 DIAGNOSIS — F10159 Alcohol abuse with alcohol-induced psychotic disorder, unspecified: Secondary | ICD-10-CM | POA: Diagnosis present

## 2017-12-22 DIAGNOSIS — F17210 Nicotine dependence, cigarettes, uncomplicated: Secondary | ICD-10-CM | POA: Insufficient documentation

## 2017-12-22 MED ORDER — RISPERIDONE 1 MG PO TABS: 1.0000 mg | ORAL_TABLET | Freq: Once | ORAL | Status: DC

## 2017-12-22 MED ORDER — RISPERIDONE 2 MG PO TABS
2.0000 mg | ORAL_TABLET | Freq: Once | ORAL | Status: AC
  Administered 2017-12-22: 2 mg via ORAL
  Filled 2017-12-22: qty 1

## 2017-12-22 NOTE — ED Triage Notes (Signed)
Pt reports hearing voice that are saying they are going to kill him since earlier today.  Pt denies suicidal ideation/homicidal ideation.

## 2017-12-22 NOTE — Progress Notes (Signed)
32 yo male who presented to the ED after drinking alcohol and hearing voices.  Discussed the use of alcohol and interaction with his psychotic medications.  [facility].  No suicidal/homicidal ideations,hallucinations, or withdrawal symptoms.  Given a one time dose of Risperdal for hallucinations, encouraged him to [facility] and refrain from alcohol intake.  Stable for discharge.  Nanine MeansJamison Jolyne Laye, PMHNP

## 2017-12-22 NOTE — ED Notes (Signed)
Bed: [facility] Expected date:  Expected time:  Means of arrival:  Comments: 

## 2017-12-22 NOTE — BHH Suicide Risk Assessment (Signed)
Suicide Risk Assessment  Discharge Assessment   [facility]   Principal Problem: Alcohol abuse with alcohol-induced psychotic disorder [facility](HCC) Discharge Diagnoses:  Patient Active Problem List   Diagnosis Date Noted  . Alcohol abuse with alcohol-induced psychotic disorder (HCC) [F10.159] 12/22/2017    Priority: High  . Adjustment disorder with depressed mood [F43.21] 09/07/2016    Priority: High  . Homelessness [Z59.0] 09/03/2016    Priority: High  . Person feigning illness [Z76.5] 09/03/2016    Priority: High  . Alcohol abuse with alcohol-induced mood disorder (HCC) [F10.14] 09/08/2016    Priority: Low  . 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 MDD (major depressive disorder), recurrent, severe, with psychosis (HCC) [F33.3] 11/21/2017  . MDD (major depressive disorder) [F32.9] 09/22/2017  . Alcohol abuse [F10.10]   . Auditory hallucinations [R44.0]   . 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 Cannabis use disorder, mild, abuse [F12.10] 08/29/2016  . Pulmonary emboli (HCC) [I26.99] 07/13/2016  . Pulmonary embolism and infarction (HCC) [I26.99] 01/02/2014  . Acute left flank pain [R10.9] 01/02/2014  . Pulmonary embolism, bilateral (HCC) [I26.99] 01/02/2014    Total Time spent with patient: 45 minutes  Musculoskeletal: Strength & Muscle Tone: within normal limits Gait & Station: normal Patient leans: N/A  Psychiatric Specialty Exam:   Blood pressure 128/73, pulse 93, temperature 98.2 F (36.8 C), temperature source Oral, resp. rate 16, SpO2 97 %.There is no height or weight on file to calculate BMI.  General Appearance: Disheveled  Eye Contact::  Good  Speech:  Normal Rate409  Volume:  Normal  Mood:  Euthymic  Affect:  Congruent  Thought Process:  Coherent and Descriptions of Associations: Intact  Orientation:  Full (Time, Place, and Person)  Thought Content:  WDL and Logical  Suicidal Thoughts:  No  Homicidal Thoughts:  No   Memory:  Immediate;   Good Recent;   Good Remote;   Good  Judgement:  Fair  Insight:  Fair  Psychomotor Activity:  Normal  Concentration:  Good  Recall:  Good  Fund of Knowledge:Fair  Language: Good  Akathisia:  No  Handed:  Right  AIMS (if indicated):     Assets:  Leisure Time Physical Health Resilience  Sleep:     Cognition: WNL  ADL's:  Intact   Mental Status Per Nursing Assessment::   On Admission:   32 yo male who presented to the ED after drinking alcohol and hearing voices.  Discussed the use of alcohol and interaction with his psychotic medications.  [facility].  No suicidal/homicidal ideations,hallucinations, or withdrawal symptoms.  Given a one time dose of Risperdal for hallucinations, encouraged him to [facility] and refrain from alcohol intake.  Stable for discharge.  Demographic Factors:  Male  Loss Factors: NA  Historical Factors: NA  Risk Reduction Factors:   Sense of responsibility to family  Continued Clinical Symptoms:  None  Cognitive Features That Contribute To Risk:  None    Suicide Risk:  Minimal: No identifiable suicidal ideation.  Patients presenting with no risk factors but with morbid ruminations; may be classified as minimal risk based on the severity of the depressive symptoms    Plan Of Care/Follow-up recommendations:  Activity:  as tolerated Diet:  heart healthy diet  LORD, JAMISON, NP 12/22/2017, 9:54 AM

## 2017-12-22 NOTE — ED Notes (Signed)
TTS at bedside. 

## 2017-12-22 NOTE — ED Triage Notes (Signed)
Patient reports that voices are telling him to kill himself. Patient denies SI/Hi at this time.

## 2017-12-22 NOTE — ED Provider Notes (Signed)
  [name] Provider Note   CSN: [id number] Arrival date & time: 12/22/17  2255     History   Chief Complaint Chief Complaint  Patient presents with  . hearing voices    HPI Cole Barrett is a 32 y.o. male.  HPI 32 yo male with a hx of mental health illness. Noncompliant with meds. Reports new auditory non-command hallucinations. No HI or SI. No other complaints. Does not remember the name of his medications. [facility]. No fevers or chills. No HA.    History reviewed. No pertinent past medical history.  There are no active problems to display for this patient.   History reviewed. No pertinent surgical history.     Home Medications    Prior to Admission medications   Not on File    Family History No family history on file.  Social History Social History   Tobacco Use  . Smoking status: Current Every Day Smoker  . Smokeless tobacco: Never Used  Substance Use Topics  . Alcohol use: Yes  . Drug use: Yes    Types: Marijuana     Allergies   Patient has no allergy information on record.   Review of Systems Review of Systems  All other systems reviewed and are negative.    Physical Exam Updated Vital Signs BP 127/76 (BP Location: Right Arm)   Pulse 77   Temp 98.4 F (36.9 C) (Oral)   Resp 20   SpO2 99%   Physical Exam  Constitutional: He is oriented to person, place, and time. He appears well-developed and well-nourished.  HENT:  Head: Normocephalic and atraumatic.  Eyes: EOM are normal.  Neck: Normal range of motion.  Cardiovascular: Normal rate, regular rhythm, normal heart sounds and intact distal pulses.  Pulmonary/Chest: Effort normal and breath sounds normal. No respiratory distress.  Abdominal: Soft. He exhibits no distension. There is no tenderness.  Musculoskeletal: Normal range of motion.  Neurological: He is alert and oriented to person, place, and time.  Skin: Skin is  warm and dry.  Psychiatric: He has a normal mood and affect. His speech is normal and behavior is normal. Judgment and thought content normal. He is not agitated, not aggressive and not hyperactive. Thought content is not paranoid. Cognition and memory are normal. He expresses no homicidal and no suicidal ideation.  Nursing note and vitals reviewed.    ED Treatments / Results  Labs (all labs ordered are listed, but only abnormal results are displayed) Labs Reviewed - No data to display  EKG  EKG Interpretation None       Radiology No results found.  Procedures Procedures (including critical care time)  Medications Ordered in ED Medications - No data to display   Initial Impression / Assessment and Plan / ED Course  I have reviewed the triage vital signs and the nursing notes.  Pertinent labs & imaging results that were available during my care of the patient were reviewed by me and considered in my medical decision making (see chart for details).     [facility]  I do not believe the patient needs acute hospitalization at this time. Does not appear to be a threat to himself or others.   Final Clinical Impressions(s) / ED Diagnoses   Final diagnoses:  Hearing voices    ED Discharge Orders    None       Azalia Bilisampos, Lan Mcneill, MD 12/22/17 2342

## 2017-12-22 NOTE — Discharge Instructions (Signed)
Walk ins available

## 2017-12-22 NOTE — ED Provider Notes (Signed)
[facility]-EMERGENCY DEPT Provider Note   CSN: [id number] Arrival date & time: 12/22/17  4098     History   Chief Complaint Chief Complaint  Patient presents with  . Hallucinations    HPI Cole Barrett is a 32 y.o. male.  HPI Patient presents with auditory hallucinations.  States that he always hears voices telling him to kill himself but they are louder today.  States that he is not suicidal.  States he was supposed to go to work but cannot work like this.  Denies substance abuse.  States he has been taking his medicines but not quite sure what they are.  States he has a Therapist, sports but cannot telemetry where he is seen.  He has 12 visits to the ER in the last 6 months for similar symptoms.  Last seen 5 days ago.  Denies substance abuse. Past Medical History:  Diagnosis Date  . Homelessness   . Hypercholesterolemia   . Mental disorder   . Paranoid behavior (HCC)   . PE (pulmonary embolism)   . Schizophrenia [facility])     Patient Active Problem List   Diagnosis Date Noted  . Schizophrenia, paranoid (HCC)   . 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 MDD (major depressive disorder) 09/22/2017  . Alcohol abuse   . Auditory hallucinations   . Alcohol abuse with alcohol-induced mood disorder (HCC) 09/08/2016  . Adjustment disorder with depressed mood 09/07/2016  . Homelessness 09/03/2016  . Person feigning illness 09/03/2016  . 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 Cannabis use disorder, mild, abuse 08/29/2016  . Pulmonary emboli (HCC) 07/13/2016  . 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 Acute left flank pain 01/02/2014  . Pulmonary embolism, bilateral (HCC) 01/02/2014    History reviewed. No pertinent surgical history.     Home Medications    Prior to Admission medications   Medication Sig Start Date End Date Taking? Authorizing Provider  benztropine (COGENTIN) 0.5  MG tablet Take 1 tablet (0.5 mg total) by mouth at bedtime. Patient not taking: Reported on 12/13/2017 12/12/17   Everlene Farrier, PA-C  FLUoxetine (PROZAC) 20 MG capsule Take 1 capsule (20 mg total) by mouth daily. Patient not taking: Reported on 12/13/2017 12/12/17   Everlene Farrier, PA-C  OLANZapine (ZYPREXA) 5 MG tablet Take 1 tablet (5 mg total) by mouth once for 1 dose. 12/14/17 12/14/17  Laveda Abbe, NP  risperiDONE (RISPERDAL) 2 MG tablet Take 1 tablet (2 mg total) by mouth 2 (two) times daily. Patient not taking: Reported on 12/13/2017 12/12/17   Everlene Farrier, PA-C    Family History Family History  Problem Relation Age of Onset  . Hypertension Mother     Social History Social History   Tobacco Use  . Smoking status: Current Every Day Smoker    Packs/day: 0.50    Types: Cigarettes  . Smokeless tobacco: Never Used  Substance Use Topics  . Alcohol use: Yes    Comment: socially  . Drug use: Yes    Types: Marijuana    Comment: Once every 2-3 months, Last used: Last month     Allergies   Haldol [haloperidol]   Review of Systems Review of Systems  Constitutional: Negative for appetite change and fever.  Respiratory: Negative for shortness of breath.   Cardiovascular: Negative for chest pain.  Gastrointestinal: Negative for abdominal pain.  Genitourinary: Negative for flank pain.  Musculoskeletal: Negative for back pain.  Neurological: Negative for seizures.  Psychiatric/Behavioral: Negative for self-injury and suicidal ideas.     Physical Exam Updated Vital Signs BP 128/73 (BP Location: Left Arm)   Pulse 93   Temp 98.2 F (36.8 C) (Oral)   Resp 16   SpO2 97%   Physical Exam  Constitutional: He appears well-developed.  HENT:  Head: Atraumatic.  Eyes: Pupils are equal, round, and reactive to light.  Neck: Neck supple.  Cardiovascular: Normal rate.  Pulmonary/Chest: Effort normal.  Abdominal: Soft.  Musculoskeletal: He exhibits no edema.    Neurological: He is alert.  Skin: Skin is warm. Capillary refill takes less than 2 seconds.  Psychiatric: He has a normal mood and affect.     ED Treatments / Results  Labs (all labs ordered are listed, but only abnormal results are displayed) Labs Reviewed - No data to display  EKG  EKG Interpretation None       Radiology No results found.  Procedures Procedures (including critical care time)  Medications Ordered in ED Medications - No data to display   Initial Impression / Assessment and Plan / ED Course  I have reviewed the triage vital signs and the nursing notes.  Pertinent labs & imaging results that were available during my care of the patient were reviewed by me and considered in my medical decision making (see chart for details).     Patient with auditory hallucinations telling him to kill himself.  States the voices are louder.  Frequent workups for same.  However does not appear to do much follow-up.  Medically cleared.  To be seen by TTS.  Final Clinical Impressions(s) / ED Diagnoses   Final diagnoses:  Auditory hallucination    ED Discharge Orders    None       Benjiman CorePickering, Antwain Caliendo, MD 12/22/17 [phone]

## 2017-12-24 ENCOUNTER — Other Ambulatory Visit: Payer: Self-pay

## 2017-12-24 ENCOUNTER — Encounter (HOSPITAL_COMMUNITY): Payer: Self-pay

## 2017-12-24 ENCOUNTER — Emergency Department (HOSPITAL_COMMUNITY)
Admission: EM | Admit: 2017-12-24 | Discharge: 2017-12-24 | Disposition: A | Discharge status: Other Acute Inpatient Hospital | Payer: Self-pay | Attending: Emergency Medicine | Admitting: Emergency Medicine

## 2017-12-24 ENCOUNTER — Inpatient Hospital Stay (HOSPITAL_COMMUNITY)
Admission: AD | Admit: 2017-12-24 | Discharge: 2017-12-28 | DRG: 885 | Disposition: A | Discharge status: Home / Self Care | Payer: Federal, State, Local not specified - Other | Source: Intra-hospital | Attending: Psychiatry | Admitting: Psychiatry

## 2017-12-24 DIAGNOSIS — F419 Anxiety disorder, unspecified: Secondary | ICD-10-CM | POA: Diagnosis not present

## 2017-12-24 DIAGNOSIS — F10159 Alcohol abuse with alcohol-induced psychotic disorder, unspecified: Secondary | ICD-10-CM | POA: Diagnosis not present

## 2017-12-24 DIAGNOSIS — F209 Schizophrenia, unspecified: Secondary | ICD-10-CM | POA: Insufficient documentation

## 2017-12-24 DIAGNOSIS — E7800 Pure hypercholesterolemia, unspecified: Secondary | ICD-10-CM | POA: Diagnosis present

## 2017-12-24 DIAGNOSIS — F329 Major depressive disorder, single episode, unspecified: Secondary | ICD-10-CM | POA: Insufficient documentation

## 2017-12-24 DIAGNOSIS — F1290 Cannabis use, unspecified, uncomplicated: Secondary | ICD-10-CM | POA: Diagnosis not present

## 2017-12-24 DIAGNOSIS — Z888 Allergy status to other drugs, medicaments and biological substances status: Secondary | ICD-10-CM | POA: Diagnosis not present

## 2017-12-24 DIAGNOSIS — R440 Auditory hallucinations: Secondary | ICD-10-CM

## 2017-12-24 DIAGNOSIS — F17210 Nicotine dependence, cigarettes, uncomplicated: Secondary | ICD-10-CM | POA: Diagnosis not present

## 2017-12-24 DIAGNOSIS — Z590 Homelessness: Secondary | ICD-10-CM | POA: Insufficient documentation

## 2017-12-24 DIAGNOSIS — F200 Paranoid schizophrenia: Secondary | ICD-10-CM | POA: Diagnosis not present

## 2017-12-24 DIAGNOSIS — R443 Hallucinations, unspecified: Secondary | ICD-10-CM | POA: Diagnosis not present

## 2017-12-24 DIAGNOSIS — Z86711 Personal history of pulmonary embolism: Secondary | ICD-10-CM

## 2017-12-24 DIAGNOSIS — G4700 Insomnia, unspecified: Secondary | ICD-10-CM | POA: Diagnosis present

## 2017-12-24 LAB — CBC WITH DIFFERENTIAL/PLATELET
Basophils Absolute: 0 10*3/uL (ref 0.0–0.1)
Basophils Relative: 1 %
EOS PCT: 3 %
Eosinophils Absolute: 0.2 10*3/uL (ref 0.0–0.7)
HCT: 38.8 % — ABNORMAL LOW (ref 39.0–52.0)
HEMOGLOBIN: 12.8 g/dL — AB (ref 13.0–17.0)
LYMPHS ABS: 1.7 10*3/uL (ref 0.7–4.0)
LYMPHS PCT: 36 %
MCH: 28 pg (ref 26.0–34.0)
MCHC: 33 g/dL (ref 30.0–36.0)
MCV: 84.9 fL (ref 78.0–100.0)
Monocytes Absolute: 0.7 10*3/uL (ref 0.1–1.0)
Monocytes Relative: 14 %
Neutro Abs: 2.2 10*3/uL (ref 1.7–7.7)
Neutrophils Relative %: 46 %
Platelets: 306 10*3/uL (ref 150–400)
RBC: 4.57 MIL/uL (ref 4.22–5.81)
RDW: 13.1 % (ref 11.5–15.5)
WBC: 4.7 10*3/uL (ref 4.0–10.5)

## 2017-12-24 LAB — COMPREHENSIVE METABOLIC PANEL
ALT: 35 U/L (ref 17–63)
AST: 30 U/L (ref 15–41)
Albumin: 3.9 g/dL (ref 3.5–5.0)
Alkaline Phosphatase: 50 U/L (ref 38–126)
Anion gap: 13 (ref 5–15)
BILIRUBIN TOTAL: 0.7 mg/dL (ref 0.3–1.2)
BUN: 9 mg/dL (ref 6–20)
CO2: 21 mmol/L — ABNORMAL LOW (ref 22–32)
CREATININE: 1.05 mg/dL (ref 0.61–1.24)
Calcium: 9 mg/dL (ref 8.9–10.3)
Chloride: 105 mmol/L (ref 101–111)
GFR calc Af Amer: >60 mL/min (ref >60)
Glucose, Bld: 85 mg/dL (ref 65–99)
Potassium: 4.2 mmol/L (ref 3.5–5.1)
Sodium: 139 mmol/L (ref 135–145)
TOTAL PROTEIN: 7.1 g/dL (ref 6.5–8.1)

## 2017-12-24 LAB — ETHANOL: Alcohol, Ethyl (B): <10 mg/dL (ref <10)

## 2017-12-24 MED ORDER — TRAZODONE HCL 50 MG PO TABS
50.0000 mg | ORAL_TABLET | Freq: Every evening | ORAL | Status: DC | PRN
  Administered 2017-12-25 – 2017-12-27 (×4): 50 mg via ORAL
  Filled 2017-12-24 (×4): qty 1
  Filled 2017-12-24: qty 7

## 2017-12-24 MED ORDER — MAGNESIUM HYDROXIDE 400 MG/5ML PO SUSP: 30.0000 mL | Freq: Every day | ORAL | Status: DC | PRN

## 2017-12-24 MED ORDER — BENZTROPINE MESYLATE 1 MG PO TABS: 0.5000 mg | ORAL_TABLET | Freq: Every day | ORAL | Status: DC

## 2017-12-24 MED ORDER — FLUOXETINE HCL 20 MG PO CAPS
20.0000 mg | ORAL_CAPSULE | Freq: Every day | ORAL | Status: DC
  Administered 2017-12-24 – 2017-12-28 (×5): 20 mg via ORAL
  Filled 2017-12-24 (×7): qty 1

## 2017-12-24 MED ORDER — ACETAMINOPHEN 325 MG PO TABS: 650.0000 mg | ORAL_TABLET | Freq: Four times a day (QID) | ORAL | Status: DC | PRN

## 2017-12-24 MED ORDER — RISPERIDONE 2 MG PO TABS
2.0000 mg | ORAL_TABLET | Freq: Two times a day (BID) | ORAL | Status: DC
  Administered 2017-12-24 – 2017-12-28 (×8): 2 mg via ORAL
  Filled 2017-12-24 (×11): qty 1

## 2017-12-24 MED ORDER — BENZTROPINE MESYLATE 0.5 MG PO TABS
0.5000 mg | ORAL_TABLET | Freq: Every day | ORAL | Status: DC
  Administered 2017-12-25 – 2017-12-27 (×4): 0.5 mg via ORAL
  Filled 2017-12-24 (×7): qty 1

## 2017-12-24 MED ORDER — HYDROXYZINE HCL 25 MG PO TABS
25.0000 mg | ORAL_TABLET | Freq: Three times a day (TID) | ORAL | Status: DC | PRN
  Administered 2017-12-25 – 2017-12-27 (×4): 25 mg via ORAL
  Filled 2017-12-24 (×3): qty 1
  Filled 2017-12-24: qty 10
  Filled 2017-12-24: qty 1
  Filled 2017-12-24: qty 10

## 2017-12-24 MED ORDER — ALUM & MAG HYDROXIDE-SIMETH 200-200-20 MG/5ML PO SUSP: 30.0000 mL | ORAL | Status: DC | PRN

## 2017-12-24 MED ORDER — FLUOXETINE HCL 20 MG PO CAPS
20.0000 mg | ORAL_CAPSULE | Freq: Every day | ORAL | Status: DC
  Administered 2017-12-24: 20 mg via ORAL
  Filled 2017-12-24: qty 1

## 2017-12-24 MED ORDER — RISPERIDONE 1 MG PO TABS
2.0000 mg | ORAL_TABLET | Freq: Two times a day (BID) | ORAL | Status: DC
  Administered 2017-12-24: 2 mg via ORAL
  Filled 2017-12-24: qty 1

## 2017-12-24 NOTE — ED Notes (Signed)
Pt ate breakfast.

## 2017-12-24 NOTE — Progress Notes (Signed)
D: Pt +ve AH , pt remained in room and kept to himself this evening. . Pt is pleasant and cooperative.   A: Pt was offered support and encouragement. Pt was given scheduled medications. Pt was encourage to attend groups. Q 15 minute checks were done for safety.   R: safety maintained on unit.

## 2017-12-24 NOTE — ED Provider Notes (Signed)
[name] EMERGENCY DEPARTMENT Provider Note   CSN: [id number] Arrival date & time: 12/24/17  0554     History   Chief Complaint Chief Complaint  Patient presents with  . Hallucinations    HPI Cole Barrett is a 32 y.o. male.  The history is provided by the patient and medical records. No language interpreter was used.     Cole Barrett is a 32 y.o. male  with a PMH of schizophrenia, depression, homelessness who presents to the Emergency Department complaining of auditory hallucinations. He reports these are constant and getting louder. The voices are telling him they are going to kill him.  He denies any SI or HI.  He was evaluated for the same on 1/25 where he was discharged and told to follow-up with [facility].  He reports they were close to the next day he did not know where to go.  He reports taking all his medications as directed.  Denies any illicit drug use.  Denies visual hallucinations.  Past Medical History:  Diagnosis Date  . Homelessness   . Hypercholesterolemia   . Mental disorder   . Paranoid behavior (HCC)   . PE (pulmonary embolism)   . Schizophrenia [facility])     Patient Active Problem List   Diagnosis Date Noted  . 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 Schizophrenia, paranoid (HCC)   . 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 MDD (major depressive disorder) 09/22/2017  . Alcohol abuse   . Auditory hallucinations   . Alcohol abuse with alcohol-induced mood disorder (HCC) 09/08/2016  . Adjustment disorder with depressed mood 09/07/2016  . Homelessness 09/03/2016  . Person feigning illness 09/03/2016  . 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 Cannabis use disorder, mild, abuse 08/29/2016  . Pulmonary emboli (HCC) 07/13/2016  . 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 Acute left flank pain 01/02/2014  . Pulmonary  embolism, bilateral (HCC) 01/02/2014    History reviewed. No pertinent surgical history.     Home Medications    Prior to Admission medications   Medication Sig Start Date End Date Taking? Authorizing Provider  benztropine (COGENTIN) 0.5 MG tablet Take 1 tablet (0.5 mg total) by mouth at bedtime. Patient not taking: Reported on 12/13/2017 12/12/17   Everlene Farrier, PA-C  FLUoxetine (PROZAC) 20 MG capsule Take 1 capsule (20 mg total) by mouth daily. Patient not taking: Reported on 12/13/2017 12/12/17   Everlene Farrier, PA-C  risperiDONE (RISPERDAL) 2 MG tablet Take 1 tablet (2 mg total) by mouth 2 (two) times daily. Patient not taking: Reported on 12/13/2017 12/12/17   Everlene Farrier, PA-C    Family History Family History  Problem Relation Age of Onset  . Hypertension Mother     Social History Social History   Tobacco Use  . Smoking status: Current Every Day Smoker    Packs/day: 0.50    Types: Cigarettes  . Smokeless tobacco: Never Used  Substance Use Topics  . Alcohol use: Yes    Comment: socially  . Drug use: Yes    Types: Marijuana    Comment: Once every 2-3 months, Last used: Last month     Allergies   Haldol [haloperidol]   Review of Systems Review of Systems  Constitutional: Negative for fever.  HENT: Negative for congestion.   Respiratory: Negative for cough and shortness of breath.   Cardiovascular: Negative for chest pain.  Gastrointestinal: Negative for abdominal pain.  Neurological: Negative for headaches.  Psychiatric/Behavioral: Positive for hallucinations. Negative for suicidal ideas.     Physical Exam Updated Vital Signs BP 138/88   Pulse 88   Temp 97.8 F (36.6 C)   Resp 18   SpO2 98%   Physical Exam  Constitutional: He is oriented to person, place, and time. He appears well-developed and well-nourished. No distress.  HENT:  Head: Normocephalic and atraumatic.  Neck: Neck supple.  Cardiovascular: Normal rate, regular rhythm and  normal heart sounds.  No murmur heard. Pulmonary/Chest: Effort normal and breath sounds normal. No respiratory distress.  Abdominal: Soft. He exhibits no distension. There is no tenderness.  Neurological: He is alert and oriented to person, place, and time.  Skin: Skin is warm and dry.  Nursing note and vitals reviewed.    ED Treatments / Results  Labs (all labs ordered are listed, but only abnormal results are displayed) Labs Reviewed  CBC WITH DIFFERENTIAL/PLATELET - Abnormal; Notable for the following components:      Result Value   Hemoglobin 12.8 (*)    HCT 38.8 (*)    All other components within normal limits  COMPREHENSIVE METABOLIC PANEL - Abnormal; Notable for the following components:   CO2 21 (*)    All other components within normal limits  ETHANOL  RAPID URINE DRUG SCREEN, HOSP PERFORMED    EKG  EKG Interpretation None       Radiology No results found.  Procedures Procedures (including critical care time)  Medications Ordered in ED Medications  benztropine (COGENTIN) tablet 0.5 mg (not administered)  FLUoxetine (PROZAC) capsule 20 mg (20 mg Oral Given 12/24/17 1128)  risperiDONE (RISPERDAL) tablet 2 mg (2 mg Oral Given 12/24/17 1129)     Initial Impression / Assessment and Plan / ED Course  I have reviewed the triage vital signs and the nursing notes.  Pertinent labs & imaging results that were available during my care of the patient were reviewed by me and considered in my medical decision making (see chart for details).  Clinical Course as of Dec 24 1212  Sun Dec 24, 2017  [id number] 32 year old male with history of auditory hallucinations presents here with increased of his auditory hallucinations.  He states that he telling him that can hurt him.  He denies any SI or HI.  He was actually here on Friday for similar.  [facility] but was unable to go there because of the weekend.  He is hoping to be hospitalized to be stabilized but he  states he has never been hospitalized before for this.  He otherwise denies any medical complaints.  He is nontoxic appearing and does not appear to be suffering any acute toxidrome.  [MB]    Clinical Course User Index [MB] Terrilee FilesButler, Michael C, MD   Cole Barrett is a 32 y.o. male who presents to ED for auditory hallucinations. Denies SI / HI. On psych medications and taking as directed per patient. Seen in ED on 1/25 for same and [facility]. He tried to do so, but it was the weekend and they were closed. Labs obtained last week and reassuring. Do not feel repeat lab work is needed at this time. Will obtain UDS and consult TTS with disposition per TTS recommendation.   TTS recommends inpatient. Lab work obtained at their request. Medically cleared.   Final Clinical Impressions(s) / ED Diagnoses   Final diagnoses:  Auditory hallucination    ED Discharge Orders  None       Cole Barrett, Cole Picket, PA-C 12/24/17 1215    Terrilee Files, MD 12/26/17 [phone]

## 2017-12-24 NOTE — ED Notes (Signed)
[id number] - May arrive around 1430.

## 2017-12-24 NOTE — ED Notes (Signed)
[facility] - [facility], copy sent to Medical Records, and [facility].

## 2017-12-24 NOTE — ED Notes (Signed)
Pt taking a shower and changing into purple scrubs

## 2017-12-24 NOTE — BH Assessment (Addendum)
Tele Assessment Note  NO LABS HAVE BEEN COMPLETED AT TIME OF ASSESSMENT   Patient Name: Cole Barrett MRN: [id number] Referring Physician: Marijean Niemann Ward PA-C Location of Patient: [facility] Location of Provider: [facility]  Cole Barrett is an 32 y.o. male. Patient presents voluntarily to [facility] with chief complaint of auditory hallucinations. Per chart review, pt has presented to the ED several times in the past few months. He is cooperative and oriented x 4. He is hard of hearing and doesn't have hearing aids. He endorses auditory hallucinations that tell pt the voices are going to kill patient. Patient says, "I am trying to get in somewhere to help these voices." Per chart review, pt has been admitted to [facility] twice in 2018 - Oct and Dec. Pt says that he went to [facility] and has psych meds. He reports the meds aren't helping him even though he is compliant with meds. He reports euthymic mood. He says he drank two 40 oz beers last night. (See below for substance use details). Pt denies SI currently or at any time in the past. Pt denies any history of suicide attempts and denies history of self-mutilation. Pt denies homicidal thoughts or physical aggression. Pt denies having access to firearms. Pt has court date Feb 4th for second degree trespass.    Diagnosis: Schizophrenia  Past Medical History:  Past Medical History:  Diagnosis Date  . Homelessness   . Hypercholesterolemia   . Mental disorder   . Paranoid behavior (HCC)   . PE (pulmonary embolism)   . Schizophrenia (HCC)     History reviewed. No pertinent surgical history.  Family History:  Family History  Problem Relation Age of Onset  . Hypertension Mother     Social History:  reports that he has been smoking cigarettes.  He has been smoking about 0.50 packs per day. he has never used smokeless tobacco. He reports that he drinks alcohol. He reports that he uses drugs. Drug: Marijuana.  Additional  Social History:  Alcohol / Drug Use Pain Medications: pt denies abuse - see pta meds list Prescriptions: pt denies abuse - see pta meds list Over the Counter: pt denies abuse- see pta meds list History of alcohol / drug use?: Yes Longest period of sobriety (when/how long): less than 2 months Negative Consequences of Use: Personal relationships Substance #1 Name of Substance 1: cannabis 1 - Age of First Use: 18 1 - Amount (size/oz): varies 1 - Frequency: "on occasion" 1 - Duration: over several years 1 - Last Use / Amount: in early Jan Substance #2 Name of Substance 2: etoh 2 - Age of First Use: 16 2 - Amount (size/oz): two 40 oz beers 2 - Frequency: few times a week 2 - Duration: for years 2 - Last Use / Amount: 12/23/17 - two 40 oz beers  CIWA: CIWA-Ar BP: 138/88 Pulse Rate: 88 COWS:    Allergies:  Allergies  Allergen Reactions  . Haldol [Haloperidol] Shortness Of Breath    Home Medications:  (Not in a hospital admission)  OB/GYN Status:  No LMP for male patient.  General Assessment Data Assessment unable to be completed: Yes Reason for not completing assessment: pt is about to be moved to another unit Location of Assessment: [facility] ED TTS Assessment: In system Is this a Tele or Face-to-Face Assessment?: Tele Assessment Is this an Initial Assessment or a Re-assessment for this encounter?: Initial Assessment Marital status: Single Maiden name: n/a Is patient pregnant?: No Pregnancy Status: No  Living Arrangements: Alone Can pt return to current living arrangement?: Yes Admission Status: Voluntary Is patient capable of signing voluntary admission?: Yes Referral Source: Self/Family/Friend Insurance type: self pay   Crisis Care Plan Living Arrangements: Alone Legal Guardian: (himself) Name of Psychiatrist: none Name of Therapist: none  Education Status Is patient currently in school?: No Highest grade of school patient has completed: 12  Risk to self with the  past 6 months Suicidal Ideation: No Has patient been a risk to self within the past 6 months prior to admission? : No Suicidal Intent: No Has patient had any suicidal intent within the past 6 months prior to admission? : No Is patient at risk for suicide?: No Suicidal Plan?: No Has patient had any suicidal plan within the past 6 months prior to admission? : No Access to Means: No What has been your use of drugs/alcohol within the last 12 months?: etoh and cannabis Previous Attempts/Gestures: No How many times?: 0 Other Self Harm Risks: none Triggers for Past Attempts: (n/a) Intentional Self Injurious Behavior: None Family Suicide History: No Persecutory voices/beliefs?: No Depression: No Substance abuse history and/or treatment for substance abuse?: No Suicide prevention information given to non-admitted patients: Not applicable  Risk to Others within the past 6 months Homicidal Ideation: No Does patient have any lifetime risk of violence toward others beyond the six months prior to admission? : No Thoughts of Harm to Others: No Current Homicidal Intent: No Current Homicidal Plan: No Access to Homicidal Means: No Identified Victim: none History of harm to others?: No Assessment of Violence: None Noted Violent Behavior Description: pt denies history of violence Does patient have access to weapons?: No Criminal Charges Pending?: Yes Describe Pending Criminal Charges: second degree trespass Does patient have a court date: Yes Court Date: 01/01/18 Is patient on probation?: No  Psychosis Hallucinations: Auditory Delusions: None noted  Mental Status Report Appearance/Hygiene: Unremarkable(in street clothes) Eye Contact: Good Motor Activity: Freedom of movement Speech: Logical/coherent, Loud(patient is hard of hearing) Level of Consciousness: Alert Mood: Euthymic Affect: Appropriate to circumstance Anxiety Level: Minimal Thought Processes: Relevant, Coherent Judgement:  Impaired Orientation: Place, Time, Situation, Person Obsessive Compulsive Thoughts/Behaviors: None  Cognitive Functioning Concentration: Normal Memory: Recent Intact, Remote Intact IQ: Average Insight: Poor Impulse Control: Fair Appetite: Fair Sleep: Decreased Total Hours of Sleep: 8 Vegetative Symptoms: None  ADLScreening [facility] Assessment Services) Patient able to express need for assistance with ADLs?: Yes Independently performs ADLs?: Yes (appropriate for developmental age)  Prior Inpatient Therapy Prior Inpatient Therapy: Yes Prior Therapy Dates: 2018 and earlier years Prior Therapy Facilty/Provider(s): [facility] Reason for Treatment: schizohprenia  Prior Outpatient Therapy Prior Outpatient Therapy: Yes Prior Therapy Dates: 2018 Prior Therapy Facilty/Provider(s): [facility] Reason for Treatment: med management for schizophrenia Does patient have an ACCT team?: No Does patient have Intensive In-House Services?  : No Does patient have [facility] services? : Yes Does patient have P4CC services?: No  ADL Screening (condition at time of admission) Is the patient deaf or have difficulty hearing?: Yes Does the patient have difficulty seeing, even when wearing glasses/contacts?: No Does the patient have difficulty concentrating, remembering, or making decisions?: No Patient able to express need for assistance with ADLs?: Yes Does the patient have difficulty dressing or bathing?: No Independently performs ADLs?: Yes (appropriate for developmental age) Does the patient have difficulty walking or climbing stairs?: No Weakness of Legs: None Weakness of Arms/Hands: None  Home Assistive Devices/Equipment Home Assistive Devices/Equipment: None    Abuse/Neglect  Assessment (Assessment to be complete while patient is alone) Abuse/Neglect Assessment Can Be Completed: Yes Physical Abuse: Denies Verbal Abuse: Denies Sexual Abuse: Denies Exploitation of  patient/patient's resources: Denies Self-Neglect: Denies     Merchant navy officerAdvance Directives (For Healthcare) Does Patient Have a Medical Advance Directive?: No Would patient like information on creating a medical advance directive?: No - Patient declined    Additional Information 1:1 In Past 12 Months?: No CIRT Risk: No Elopement Risk: No Does patient have medical clearance?: Yes     Disposition:  Disposition Initial Assessment Completed for this Encounter: Yes Disposition of Patient: Inpatient treatment program Type of inpatient treatment program: Adult(tina okonkwo np recommends inpatient)   Leighton Ruffina Okonkwo NP recommends inpatient treatment. Writer notified pt's RN Windell MouldingRuth who will order labs.   This service was provided via telemedicine using a 2-way, interactive audio and video technology.  Names of all persons participating in this telemedicine service and their role in this encounter. Name: Windell MouldingRuth RN Patient's RN  Cole Brasilhristopher Wadlow patient  Donnamarie Rossettiaroline Kealey Kemmer TTS counselor  Name:      Cole Barrett, Cole Barrett 12/24/2017 9:47 AM

## 2017-12-24 NOTE — Tx Team (Signed)
Initial Treatment Plan 12/24/2017 5:48 PM Cole HawkingChristopher Adam Ladd [id number]    PATIENT STRESSORS: Marital or family conflict Medication change or noncompliance Substance abuse   PATIENT STRENGTHS: Capable of independent living Communication skills   PATIENT IDENTIFIED PROBLEMS: Anxiety  Substance abuse  " I want to get better"  "Get ride of voices."               DISCHARGE CRITERIA:  Ability to meet basic life and health needs Improved stabilization in mood, thinking, and/or behavior Motivation to continue treatment in a less acute level of care Verbal commitment to aftercare and medication compliance  PRELIMINARY DISCHARGE PLAN: Attend aftercare/continuing care group Attend PHP/IOP Attend 12-step recovery group Outpatient therapy  PATIENT/FAMILY INVOLVEMENT: This treatment plan has been presented to and reviewed with the patient, Cole HawkingChristopher Adam Barrett, and/or family member.  The patient and family have been given the opportunity to ask questions and make suggestions.  Cole PunchesJane O Ruba Outen, RN 12/24/2017, 5:48 PM

## 2017-12-24 NOTE — Plan of Care (Signed)
  Education: Mental status will improve 12/24/2017 2156 - Not Progressing by Delos HaringPhillips, Daouda Lonzo A, RN Note Pt continues to hear voices at this time

## 2017-12-24 NOTE — ED Notes (Signed)
Ordered breakfast tray  

## 2017-12-24 NOTE — ED Triage Notes (Signed)
Reports continuing to hear voices, denies SI/HI

## 2017-12-24 NOTE — Progress Notes (Signed)
Cole HawkingChristopher Adam Barrett is a 32 y.o. male [facility]  Pt stated he kept on hearing voices telling him that they are going to kill him. Pt stated the voices will not stop and looking for help to stop them. Pt appeared preoccupied during admission, took his time to answer the questions asked. Pt alert and orient x4, consents signed, skin/belongings search completed and pt oriented to unit. Pt stable at this time. Pt given the opportunity to express concerns and ask questions. Pt given toiletries, and food offered. Will continue to monitor.

## 2017-12-24 NOTE — Progress Notes (Addendum)
Referred pt to the following hospitals for psychiatric treatment (also [facility]): [facility]  Ilean SkillMeghan Aaylah Pokorny, MSW, [place] Clinical Social Work 12/24/2017 [phone]

## 2017-12-24 NOTE — Progress Notes (Signed)
Adult Psychoeducational Group Note  Date:  12/24/2017 Time:  11:32 PM  Group Topic/Focus:  Wrap-Up Group:   The focus of this group is to help patients review their daily goal of treatment and discuss progress on daily workbooks.  Participation Level:  Did Not Attend  Participation Quality:  Did not Attend  Affect:  Did not Attend  Cognitive:  Did not Attend  Insight: None  Engagement in Group:  Did not Attend  Modes of Intervention:  Did not Attend  Additional Comments:  Pt did not attend evening wrap up group tonight.  Cole FurnaceChristopher  Charlissa Petros 12/24/2017, 11:32 PM

## 2017-12-24 NOTE — ED Notes (Signed)
TTs in progress

## 2017-12-24 NOTE — ED Notes (Addendum)
Pt ambulated to F8 from Pod C - wearing burgundy scrubs. Pt's belongings - 2 jackets, labeled belongings bag, black shoes, and [employer] bag placed at nurses' desk for inventory. Denies SI/HI. Aware [facility] may be accepting pt this afternoon - voices agreement w/tx plan. Pt aware of need for urine specimen.

## 2017-12-25 DIAGNOSIS — F10159 Alcohol abuse with alcohol-induced psychotic disorder, unspecified: Secondary | ICD-10-CM

## 2017-12-25 DIAGNOSIS — F209 Schizophrenia, unspecified: Principal | ICD-10-CM

## 2017-12-25 DIAGNOSIS — F419 Anxiety disorder, unspecified: Secondary | ICD-10-CM

## 2017-12-25 DIAGNOSIS — R440 Auditory hallucinations: Secondary | ICD-10-CM

## 2017-12-25 DIAGNOSIS — R450 Nervousness: Secondary | ICD-10-CM

## 2017-12-25 NOTE — [facility] Suicide Risk Assessment (Signed)
[facility]   Nursing information obtained from:    Demographic factors:    Current Mental Status:    Loss Factors:    Historical Factors:    Risk Reduction Factors:     Total Time spent with patient: 1 hour Principal Problem: Schizophrenia (HCC) Diagnosis:   Patient Active Problem List   Diagnosis Date Noted  . Schizophrenia (HCC) [F20.9] 12/24/2017  . 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 MDD (major depressive disorder), recurrent, severe, with psychosis (HCC) [F33.3] 11/21/2017  . MDD (major depressive disorder) [F32.9] 09/22/2017  . Alcohol abuse [F10.10]   . Auditory hallucinations [R44.0]   . Alcohol abuse with alcohol-induced mood disorder (HCC) [F10.14] 09/08/2016  . Adjustment disorder with depressed mood [F43.21] 09/07/2016  . Homelessness [Z59.0] 09/03/2016  . Person feigning illness [Z76.5] 09/03/2016  . 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 Cannabis use disorder, mild, abuse [F12.10] 08/29/2016  . Pulmonary emboli (HCC) [I26.99] 07/13/2016  . Pulmonary embolism and infarction (HCC) [I26.99] 01/02/2014  . Acute left flank pain [R10.9] 01/02/2014  . Pulmonary embolism, bilateral (HCC) [I26.99] 01/02/2014   Subjective Data: See H&P for full HPI  Cole Barrett is a 32 y/o M with history of schizophrenia who [facility] with worsening symptoms of AH. Pt had recent relevant history of presentation to [facility] on 12/22/17 and he was discharged to outpatient level of care with [facility], but pt [facility] to ED [facility]. He was restarted on previous discharge medications of risperdal and prozac.  Continued Clinical Symptoms:  Alcohol Use Disorder Identification Test Final Score (AUDIT): 3 The "Alcohol Use Disorders Identification Test",  Guidelines for Use in Primary Care, Second Edition.  World Science writerHealth Organization [facility](WHO). Score between 0-7:  no or low risk or alcohol related problems. Score between 8-15:  moderate risk of alcohol related problems. Score between 16-19:  high risk of alcohol related problems. Score 20 or above:  warrants further diagnostic evaluation for alcohol dependence and treatment.   CLINICAL FACTORS:   Severe Anxiety and/or Agitation Schizophrenia:   Paranoid or undifferentiated type Currently Psychotic Unstable or Poor Therapeutic Relationship Previous Psychiatric Diagnoses and Treatments   Musculoskeletal: Strength & Muscle Tone: within normal limits Gait & Station: normal Patient leans: N/A  Psychiatric Specialty Exam: Physical Exam  Nursing note and vitals reviewed.   ROS - see H&P  Blood pressure 107/83, pulse 100, temperature 98 F (36.7 C), resp. rate 16, height 5\' 11"  (1.803 m), weight 103.9 kg (229 lb), SpO2 100 %.Body mass index is 31.94 kg/m.  General Appearance: Casual and Disheveled  Eye Contact:  Good  Speech:  Clear and Coherent and Normal Rate  Volume:  Normal  Mood:  Anxious and Depressed  Affect:  Appropriate, Congruent and Flat  Thought Process:  Coherent and Goal Directed  Orientation:  Full (Time, Place, and Person)  Thought Content:  Hallucinations: Auditory  Suicidal Thoughts:  No  Homicidal Thoughts:  No  Memory:  Immediate;   Fair Recent;   Fair Remote;   Fair  Judgement:  Fair  Insight:  Fair  Psychomotor Activity:  Normal  Concentration:  Concentration: Fair  Recall:  [employer]Fair  Fund of Knowledge:  Fair  Language:  Fair  Akathisia:  No  Handed:    AIMS (if indicated):  Assets:  Manufacturing systems engineer Physical Health Resilience  ADL's:  Intact  Cognition:  WNL  Sleep:  Number of Hours: 5.5        COGNITIVE FEATURES THAT CONTRIBUTE TO RISK:  None    SUICIDE RISK:   Minimal: No identifiable suicidal ideation.  Patients presenting with no risk  factors but with morbid ruminations; may be classified as minimal risk based on the severity of the depressive symptoms  PLAN OF CARE:   - Admit to inpatient level of care  - Schizophrenia   - Continue risperdal 2mg  BID  - Continue prozac 20mg  po qDay  -EPS  - Continue cogentin 0.5mg  qhs  - Anxiety  - Continue atarax 25mg  po TID prn anxiety  -Insomnia  - Continue trazodone 50mg  po qhs prn insomnia  -Encourage participation in groups and the therapeutic milieu  -Discharge planning will be ongoing  I certify that inpatient services furnished can reasonably be expected to improve the patient's condition.   Micheal Likens, MD 12/25/2017, 5:06 PM

## 2017-12-25 NOTE — Progress Notes (Signed)
Adult Psychoeducational Group Note  Date:  12/25/2017 Time:  4:04 PM  Group Topic/Focus:  Overcoming Stress:   The focus of this group is to define stress and help patients assess their triggers.  Participation Level:  Did Not Attend   Clarene Critchleylizabeth O Michoel Kunin 12/25/2017, 4:04 PM

## 2017-12-25 NOTE — Tx Team (Signed)
Interdisciplinary Treatment and Diagnostic Plan Update  12/25/2017 Time of Session: 0915 Cole Barrett MRN: [id number]  Principal Diagnosis: <principal problem not specified>  Secondary Diagnoses: Active Problems:   Schizophrenia ([place])   Current Medications:  Current Facility-Administered Medications  Medication Dose Route Frequency Provider Last Rate Last Dose  . acetaminophen (TYLENOL) tablet 650 mg  650 mg Oral Q6H PRN Okonkwo, Justina A, NP      . alum & mag hydroxide-simeth (MAALOX/MYLANTA) 200-200-20 MG/5ML suspension 30 mL  30 mL Oral Q4H PRN Okonkwo, Justina A, NP      . benztropine (COGENTIN) tablet 0.5 mg  0.5 mg Oral QHS Okonkwo, Justina A, NP   0.5 mg at 12/25/17 0050  . FLUoxetine (PROZAC) capsule 20 mg  20 mg Oral Daily Okonkwo, Justina A, NP   20 mg at 12/25/17 0833  . hydrOXYzine (ATARAX/VISTARIL) tablet 25 mg  25 mg Oral TID PRN Hughie Closs A, NP   25 mg at 12/25/17 0050  . magnesium hydroxide (MILK OF MAGNESIA) suspension 30 mL  30 mL Oral Daily PRN Okonkwo, Justina A, NP      . risperiDONE (RISPERDAL) tablet 2 mg  2 mg Oral BID Okonkwo, Justina A, NP   2 mg at 12/25/17 5277  . traZODone (DESYREL) tablet 50 mg  50 mg Oral QHS PRN Okonkwo, Justina A, NP   50 mg at 12/25/17 0050   PTA Medications: Medications Prior to Admission  Medication Sig Dispense Refill Last Dose  . risperiDONE (RISPERDAL) 3 MG tablet Take 3 mg by mouth at bedtime.   Past Week at Unknown time    Patient Stressors: Marital or family conflict Medication change or noncompliance Substance abuse  Patient Strengths: Capable of independent living Communication skills  Treatment Modalities: Medication Management, Group therapy, Case management,  1 to 1 session with clinician, Psychoeducation, Recreational therapy.   Physician Treatment Plan for Primary Diagnosis: <principal problem not specified> Long Term Goal(s):     Short Term Goals:    Medication Management: Evaluate  patient's response, side effects, and tolerance of medication regimen.  Therapeutic Interventions: 1 to 1 sessions, Unit Group sessions and Medication administration.  Evaluation of Outcomes: Not Met  Physician Treatment Plan for Secondary Diagnosis: Active Problems:   Schizophrenia ([place])  Long Term Goal(s):     Short Term Goals:       Medication Management: Evaluate patient's response, side effects, and tolerance of medication regimen.  Therapeutic Interventions: 1 to 1 sessions, Unit Group sessions and Medication administration.  Evaluation of Outcomes: Not Met   RN Treatment Plan for Primary Diagnosis: <principal problem not specified> Long Term Goal(s): Knowledge of disease and therapeutic regimen to maintain health will improve  Short Term Goals: Ability to identify and develop effective coping behaviors will improve and Compliance with prescribed medications will improve  Medication Management: RN will administer medications as ordered by provider, will assess and evaluate patient's response and provide education to patient for prescribed medication. RN will report any adverse and/or side effects to prescribing provider.  Therapeutic Interventions: 1 on 1 counseling sessions, Psychoeducation, Medication administration, Evaluate responses to treatment, Monitor vital signs and CBGs as ordered, Perform/monitor CIWA, COWS, AIMS and Fall Risk screenings as ordered, Perform wound care treatments as ordered.  Evaluation of Outcomes: Not Met   LCSW Treatment Plan for Primary Diagnosis: <principal problem not specified> Long Term Goal(s): Safe transition to appropriate next level of care at discharge, Engage patient in therapeutic group addressing interpersonal concerns.  Short Term Goals:  Engage patient in aftercare planning with referrals and resources, Increase social support and Increase skills for wellness and recovery  Therapeutic Interventions: Assess for all discharge needs,  1 to 1 time with Social worker, Explore available resources and support systems, Assess for adequacy in community support network, Educate family and significant other(s) on suicide prevention, Complete Psychosocial Assessment, Interpersonal group therapy.  Evaluation of Outcomes: Not Met   Progress in Treatment: Attending groups: No. Participating in groups: No. Taking medication as prescribed: Yes. Toleration medication: Yes. Family/Significant other contact made: No, will contact:  contact Patient understands diagnosis: Yes. Discussing patient identified problems/goals with staff: Yes. Medical problems stabilized or resolved: Yes. Denies suicidal/homicidal ideation: Yes. Issues/concerns per patient self-inventory: No. Other: none  New problem(s) identified: No, Describe:  none  New Short Term/Long Term Goal(s):Pt goal: "getting these voices to calm down."  Discharge Plan or Barriers:   Reason for Continuation of Hospitalization: Hallucinations Medication stabilization  Estimated Length of Stay: 3-5 days.  Attendees: Patient: Cole Barrett 12/25/2017   Physician: Dr. Nancy Fetter, MD 12/25/2017   Nursing: Sena Hitch, RN 12/25/2017   RN Care Manager:   Social Worker: Lurline Idol, LCSW 12/25/2017   Recreational Therapist:  12/25/2017   Other:  12/25/2017   Other:  12/25/2017   Other: 12/25/2017        Scribe for Treatment Team: Joanne Chars, LCSW 12/25/2017 3:25 PM

## 2017-12-25 NOTE — Progress Notes (Signed)
Recreation Therapy Notes  Date: 12/25/17 Time: 1000 Location: 500 Hall Dayroom  Group Topic: Anger Management  Goal Area(s) Addresses:  Patient will identify triggers for anger.  Patient will identify physical reaction to anger.   Patient will identify benefit of using coping skills when angry.  Intervention: Worksheet, pencils  Activity: Introduction to Anger Management.  Patients were given Barrett worksheet where they were to identify at least 3 situations/topics/people that lead to anger, how they react to anger and problems caused because of anger.  Patients were also asked to identify coping skills they use to deal with anger.  Education: Anger Management, Discharge Planning   Education Outcome: Acknowledges education/In group clarification offered/Needs additional education.   Clinical Observations/Feedback: Pt did not attend group.   Cole Barrett, LRT/CTRS          Cole Barrett 12/25/2017 12:50 PM 

## 2017-12-25 NOTE — Progress Notes (Signed)
Pt did not attend goals/orientation group this morning.  

## 2017-12-25 NOTE — H&P (Signed)
Psychiatric Admission Assessment Adult  Patient Identification: Cole Barrett MRN:  [id number] Date of Evaluation:  12/25/2017 Chief Complaint:  Schizophrenia Principal Diagnosis: Schizophrenia ([place]) Diagnosis:   Patient Active Problem List   Diagnosis Date Noted  . Schizophrenia ([place]) [F20.9] 12/24/2017  . Alcohol abuse with alcohol-induced psychotic disorder ([place]) [F10.159] 12/22/2017  . 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 MDD (major depressive disorder), recurrent, severe, with psychosis ([place]) [F33.3] 11/21/2017  . MDD (major depressive disorder) [F32.9] 09/22/2017  . Alcohol abuse [F10.10]   . Auditory hallucinations [R44.0]   . Alcohol abuse with alcohol-induced mood disorder ([place]) [F10.14] 09/08/2016  . Adjustment disorder with depressed mood [F43.21] 09/07/2016  . Homelessness [Z59.0] 09/03/2016  . Person feigning illness [Z76.5] 09/03/2016  . 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 Cannabis use disorder, mild, abuse [F12.10] 08/29/2016  . Pulmonary emboli ([place]) [I26.99] 07/13/2016  . Pulmonary embolism and infarction ([place]) [I26.99] 01/02/2014  . Acute left flank pain [R10.9] 01/02/2014  . Pulmonary embolism, bilateral ([place]) [I26.99] 01/02/2014   History of Present Illness:   Cole Barrett is a 32 y/o M with history of schizophrenia who was admitted voluntarily from [facility] ED with worsening symptoms of AH. Pt had recent relevant history of presentation to [facility] on 12/22/17 and he was discharged to outpatient level of care with plan for follow up at [facility], but pt returned to ED after reporting that [facility] was closed when he attempted to go there.  Upon evaluation, pt shares, "I went to the hospital Friday and they told me to go to [facility], but it wasn't open when I went. The voices were up real loud and I couldn't stand it, so I went back to the hospital, and they sent me here." Pt reports worsening AH for the past 3 days without specific  trigger or stressor which is causing them to worsen. AH are not command in nature but they are distressing to the patient, as he explains, "They tell me that they are gonna kill me." He denies SI/HI/VH. He reports that he has been sleeping well at his home. His appetite is good. He has some anxiety and depression related to his worsening voices, but he otherwise denies symptoms of depression, mania, OCD, and PTSD. He reports drinking "Two beers" prior to presentation, but he otherwise denies all illicit substance use.  Discussed with patient about treatment options. He was most recently discharged from [facility] in [place] on regimen of prozac and risperdal, which he feels was a helpful combination. Pt has already been resumed on those medications upon initial presentation, and he reports that he is already noticing that [facility] are improving. He agrees to continue his current regimen without changes.  Associated Signs/Symptoms: Depression Symptoms:  depressed mood, anxiety, (Hypo) Manic Symptoms:  Hallucinations, Anxiety Symptoms:  Excessive Worry, Psychotic Symptoms:  Hallucinations: Auditory PTSD Symptoms: NA Total Time spent with patient: 1 hour  Past Psychiatric History:  - previous diagnosis of schizophrenia - multiple inpatient stays with last admission to [facility] in December 2018 and October 2018 - No current outpatient provider - No history of suicide attempt  Is the patient at risk to self? Yes.    Has the patient been a risk to self in the past 6 months? Yes.    Has the patient been a risk to self within the distant past? Yes.    Is the patient a risk to others? Yes.    Has the patient been a risk to others in  the past 6 months? Yes.    Has the patient been a risk to others within the distant past? Yes.     Prior Inpatient Therapy:   Prior Outpatient Therapy:    Alcohol Screening: 1. How often do you have a drink containing alcohol?: 2 to 3 times a week 2. How many drinks containing  alcohol do you have on a typical day when you are drinking?: 1 or 2 3. How often do you have six or more drinks on one occasion?: Never AUDIT-C Score: 3 4. How often during the last year have you found that you were not able to stop drinking once you had started?: Never 5. How often during the last year have you failed to do what was normally expected from you becasue of drinking?: Never 6. How often during the last year have you needed a first drink in the morning to get yourself going after a heavy drinking session?: Never 7. How often during the last year have you had a feeling of guilt of remorse after drinking?: Never 8. How often during the last year have you been unable to remember what happened the night before because you had been drinking?: Never 9. Have you or someone else been injured as a result of your drinking?: No 10. Has a relative or friend or a doctor or another health worker been concerned about your drinking or suggested you cut down?: No Alcohol Use Disorder Identification Test Final Score (AUDIT): 3 Intervention/Follow-up: AUDIT Score <7 follow-up not indicated Substance Abuse History in the last 12 months:  Yes.   Consequences of Substance Abuse: Negative Previous Psychotropic Medications: Yes  Psychological Evaluations: Yes  Past Medical History:  Past Medical History:  Diagnosis Date  . Homelessness   . Hypercholesterolemia   . Mental disorder   . Paranoid behavior ([place])   . PE (pulmonary embolism)   . Schizophrenia ([place])    History reviewed. No pertinent surgical history. Family History:  Family History  Problem Relation Age of Onset  . Hypertension Mother    Family Psychiatric  History: denies family psychiatric history  Tobacco Screening: Have you used any form of tobacco in the last 30 days? (Cigarettes, Smokeless Tobacco, Cigars, and/or Pipes): Yes Tobacco use, Select all that apply: 4 or less cigarettes per day Are you interested in Tobacco  Cessation Medications?: No, patient refused Counseled patient on smoking cessation including recognizing danger situations, developing coping skills and basic information about quitting provided: Yes Social History: Born and raised in [place]. He lives by himself in a house. He has completed some college. He has no spouse or children. He has no legal history. He denies trauma history.  Social History   Substance and Sexual Activity  Alcohol Use Yes   Comment: socially     Social History   Substance and Sexual Activity  Drug Use Yes  . Types: Marijuana   Comment: Once every 2-3 months, Last used: Last month    Additional Social History:      Pain Medications: pt denies abuse - see pta meds list Prescriptions: pt denies abuse - see pta meds list Over the Counter: pt denies abuse- see pta meds list History of alcohol / drug use?: Yes Longest period of sobriety (when/how long): less than 2 months Negative Consequences of Use: Personal relationships Withdrawal Symptoms: Patient aware of relationship between substance abuse and physical/medical complications Name of Substance 1: cannabis 1 - Age of First Use: 18 1 - Amount (size/oz): varies 1 -  Frequency: "on occasion" 1 - Duration: over several years 1 - Last Use / Amount: in early Jan Name of Substance 2: etoh 2 - Age of First Use: 16 2 - Amount (size/oz): two 40 oz beers 2 - Frequency: few times a week 2 - Duration: for years 2 - Last Use / Amount: 12/23/17 - two 40 oz beers                Allergies:   Allergies  Allergen Reactions  . Haldol [Haloperidol] Shortness Of Breath   Lab Results:  Results for orders placed or performed during the hospital encounter of 12/24/17 (from the past 48 hour(s))  CBC with Differential     Status: Abnormal   Collection Time: 12/24/17 10:21 AM  Result Value Ref Range   WBC 4.7 4.0 - 10.5 K/uL   RBC 4.57 4.22 - 5.81 MIL/uL   Hemoglobin 12.8 (L) 13.0 - 17.0 g/dL   HCT 38.8 (L)  39.0 - 52.0 %   MCV 84.9 78.0 - 100.0 fL   MCH 28.0 26.0 - 34.0 pg   MCHC 33.0 30.0 - 36.0 g/dL   RDW 13.1 11.5 - 15.5 %   Platelets 306 150 - 400 K/uL   Neutrophils Relative % 46 %   Neutro Abs 2.2 1.7 - 7.7 K/uL   Lymphocytes Relative 36 %   Lymphs Abs 1.7 0.7 - 4.0 K/uL   Monocytes Relative 14 %   Monocytes Absolute 0.7 0.1 - 1.0 K/uL   Eosinophils Relative 3 %   Eosinophils Absolute 0.2 0.0 - 0.7 K/uL   Basophils Relative 1 %   Basophils Absolute 0.0 0.0 - 0.1 K/uL  Ethanol     Status: None   Collection Time: 12/24/17 10:21 AM  Result Value Ref Range   Alcohol, Ethyl (B) <10 <10 mg/dL    Comment:        LOWEST DETECTABLE LIMIT FOR SERUM ALCOHOL IS 10 mg/dL FOR MEDICAL PURPOSES ONLY   Comprehensive metabolic panel     Status: Abnormal   Collection Time: 12/24/17 10:23 AM  Result Value Ref Range   Sodium 139 135 - 145 mmol/L   Potassium 4.2 3.5 - 5.1 mmol/L   Chloride 105 101 - 111 mmol/L   CO2 21 (L) 22 - 32 mmol/L   Glucose, Bld 85 65 - 99 mg/dL   BUN 9 6 - 20 mg/dL   Creatinine, Ser 1.05 0.61 - 1.24 mg/dL   Calcium 9.0 8.9 - 10.3 mg/dL   Total Protein 7.1 6.5 - 8.1 g/dL   Albumin 3.9 3.5 - 5.0 g/dL   AST 30 15 - 41 U/L   ALT 35 17 - 63 U/L   Alkaline Phosphatase 50 38 - 126 U/L   Total Bilirubin 0.7 0.3 - 1.2 mg/dL   GFR calc non Af Amer >60 >60 mL/min   GFR calc Af Amer >60 >60 mL/min    Comment: (NOTE) The eGFR has been calculated using the CKD EPI equation. This calculation has not been validated in all clinical situations. eGFR's persistently <60 mL/min signify possible Chronic Kidney Disease.    Anion gap 13 5 - 15    Blood Alcohol level:  Lab Results  Component Value Date   ETH <10 12/24/2017   ETH 58 (H) 41/66/0630    Metabolic Disorder Labs:  Lab Results  Component Value Date   HGBA1C 5.3 11/26/2017   MPG 105.41 11/26/2017   MPG 103 08/27/2016   Lab Results  Component Value  Date   PROLACTIN 63.0 (H) 11/26/2017   PROLACTIN 34.1 (H)  08/27/2016   Lab Results  Component Value Date   CHOL 250 (H) 11/26/2017   TRIG 201 (H) 11/26/2017   HDL 44 11/26/2017   CHOLHDL 5.7 11/26/2017   VLDL 40 11/26/2017   LDLCALC 166 (H) 11/26/2017   LDLCALC 81 08/27/2016    Current Medications: Current Facility-Administered Medications  Medication Dose Route Frequency Provider Last Rate Last Dose  . acetaminophen (TYLENOL) tablet 650 mg  650 mg Oral Q6H PRN Okonkwo, Justina A, NP      . alum & mag hydroxide-simeth (MAALOX/MYLANTA) 200-200-20 MG/5ML suspension 30 mL  30 mL Oral Q4H PRN Okonkwo, Justina A, NP      . benztropine (COGENTIN) tablet 0.5 mg  0.5 mg Oral QHS Okonkwo, Justina A, NP   0.5 mg at 12/25/17 0050  . FLUoxetine (PROZAC) capsule 20 mg  20 mg Oral Daily Okonkwo, Justina A, NP   20 mg at 12/25/17 0833  . hydrOXYzine (ATARAX/VISTARIL) tablet 25 mg  25 mg Oral TID PRN Hughie Closs A, NP   25 mg at 12/25/17 0050  . magnesium hydroxide (MILK OF MAGNESIA) suspension 30 mL  30 mL Oral Daily PRN Okonkwo, Justina A, NP      . risperiDONE (RISPERDAL) tablet 2 mg  2 mg Oral BID Okonkwo, Justina A, NP   2 mg at 12/25/17 7096  . traZODone (DESYREL) tablet 50 mg  50 mg Oral QHS PRN Okonkwo, Justina A, NP   50 mg at 12/25/17 0050   PTA Medications: Medications Prior to Admission  Medication Sig Dispense Refill Last Dose  . risperiDONE (RISPERDAL) 3 MG tablet Take 3 mg by mouth at bedtime.   Past Week at Unknown time    Musculoskeletal: Strength & Muscle Tone: within normal limits Gait & Station: normal Patient leans: N/A  Psychiatric Specialty Exam: Physical Exam  Nursing note and vitals reviewed.   Review of Systems  Constitutional: Negative for chills and fever.  Respiratory: Negative for cough and shortness of breath.   Cardiovascular: Negative for chest pain.  Gastrointestinal: Negative for abdominal pain, heartburn, nausea and vomiting.  Psychiatric/Behavioral: Positive for depression, hallucinations and substance  abuse. Negative for suicidal ideas. The patient is nervous/anxious. The patient does not have insomnia.     Blood pressure 107/83, pulse 100, temperature 98 F (36.7 C), resp. rate 16, height _0  (1.803 m), weight 103.9 kg (229 lb), SpO2 100 %.Body mass index is 31.94 kg/m.  General Appearance: Casual and Disheveled  Eye Contact:  Good  Speech:  Clear and Coherent and Normal Rate  Volume:  Normal  Mood:  Anxious and Depressed  Affect:  Appropriate, Congruent and Flat  Thought Process:  Coherent and Goal Directed  Orientation:  Full (Time, Place, and Person)  Thought Content:  Hallucinations: Auditory  Suicidal Thoughts:  No  Homicidal Thoughts:  No  Memory:  Immediate;   Fair Recent;   Fair Remote;   Fair  Judgement:  Fair  Insight:  Fair  Psychomotor Activity:  Normal  Concentration:  Concentration: Fair  Recall:  [employer] of Knowledge:  Fair  Language:  Fair  Akathisia:  No  Handed:    AIMS (if indicated):     Assets:  Communication Skills Physical Health Resilience  ADL's:  Intact  Cognition:  WNL  Sleep:  Number of Hours: 5.5    Treatment Plan Summary: Daily contact with patient to assess and evaluate symptoms and progress  in treatment and Medication management  Observation Level/Precautions:  15 minute checks  Laboratory:  CBC Chemistry Profile HbAIC UDS  Psychotherapy:  Encourage participation in groups and the therapeutic milieu  Medications:  Continue prozac 24m po qDay, continue risperdal 254mBID  Consultations:    Discharge Concerns:    Estimated LOS: 5-7 days  Other:     Physician Treatment Plan for Primary Diagnosis: Schizophrenia ([place] Term Goal(s): Improvement in symptoms so as ready for discharge  Short Term Goals: Compliance with prescribed medications will improve  Physician Treatment Plan for Secondary Diagnosis: Principal Problem:   Schizophrenia ([place] Long Term Goal(s): Improvement in symptoms so as ready for  discharge  Short Term Goals: Ability to identify and develop effective coping behaviors will improve  I certify that inpatient services furnished can reasonably be expected to improve the patient's condition.    ChPennelope BrackenMD 1/28/20194:53 PM

## 2017-12-25 NOTE — Progress Notes (Signed)
Pt had issue with roommate , the roommate stated pt was making too much noise when he was eating a snack, so pt had to sit in the dayroom for a little while.

## 2017-12-25 NOTE — [facility] Group Notes (Signed)
[facility] LCSW Group Therapy Note  Date/Time: 12/25/17, 1315  Type of Therapy and Topic:  Group Therapy:  Overcoming Obstacles  Participation Level:  Did not attend  Description of Group:    In this group patients will be encouraged to explore what they see as obstacles to their own wellness and recovery. They will be guided to discuss their thoughts, feelings, and behaviors related to these obstacles. The group will process together ways to cope with barriers, with attention given to specific choices patients can make. Each patient will be challenged to identify changes they are motivated to make in order to overcome their obstacles. This group will be process-oriented, with patients participating in exploration of their own experiences as well as giving and receiving support and challenge from other group members.  Therapeutic Goals: 1. Patient will identify personal and current obstacles as they relate to admission. 2. Patient will identify barriers that currently interfere with their wellness or overcoming obstacles.  3. Patient will identify feelings, thought process and behaviors related to these barriers. 4. Patient will identify two changes they are willing to make to overcome these obstacles:    Summary of Patient Progress      Therapeutic Modalities:   Cognitive Behavioral Therapy Solution Focused Therapy Motivational Interviewing Relapse Prevention Therapy  Greg Braidyn Peace, LCSW 

## 2017-12-25 NOTE — Plan of Care (Signed)
Patient is compliant with prescribed medications.  Presents with anxious affect and mood.  Reports hearing voices telling him to hurt himself.  Patient verbally contracts for safety.  Remained in his room most of this shift.  Minimal interaction with staff and peers.  Patient is safe on the unit.

## 2017-12-25 NOTE — Progress Notes (Signed)
Pt did not attend afternoon psychoed group, remained in his room throughout the hour.

## 2017-12-25 NOTE — BHH Counselor (Signed)
Adult Comprehensive Assessment  Patient ID: Cole HawkingChristopher Adam Barrett, male   DOB: 10/19/1986, 32 y.o.   MRN: [id number]    Information Source: Information source: Patient  Current Stressors: Increased auditory hallucinations.  Pt denies other stressors. Educational / Learning stressors: Denies stressors Employment / Job issues: Denies stressors Family Relationships: Denies Chief Technology Officerstressors Financial / Lack of resources (include bankruptcy): Denies stressors Housing / Lack of housing: Denies stressors Physical health (include injuries & life threatening diseases): Denies stressors Social relationships: Denies stressors Substance abuse: Denies stressors Bereavement / Loss: Denies stressors  Living/Environment/Situation:  Living Arrangements: Alone Living conditions (as described by patient or guardian): Good, house How long has patient lived in current situation?: 4 years What is atmosphere in current home: Comfortable, Supportive  Family History:  Marital status: Single Are you sexually active?: Yes What is your sexual orientation?: Heterosexual  Has your sexual activity been affected by drugs, alcohol, medication, or emotional stress?: N/A  Does patient have children?: No  Childhood History:  By whom was/is the patient raised?: Mother Description of patient's relationship with caregiver when they were a child: Mother - fine; Father - fine Patient's description of current relationship with people who raised him/her: Mother - "cool still", Father - cool How were you disciplined when you got in trouble as a child/adolescent?: "They ain't did nothing." Does patient have siblings?: Yes Number of Siblings: 1 Description of patient's current relationship with siblings: Brother - get along "cool" Did patient suffer any verbal/emotional/physical/sexual abuse as a child?: No Did patient suffer from severe childhood neglect?: No Has patient ever been sexually abused/assaulted/raped as an  adolescent or adult?: No Was the patient ever a victim of a crime or a disaster?: No Witnessed domestic violence?: No Has patient been effected by domestic violence as an adult?: No  Education:  Highest grade of school patient has completed: HS diploma, 12th grade. 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 (Pt was positive for alcohol from labwork) If attempted suicide, did drugs/alcohol play a role in this?: No Alcohol/Substance Abuse Treatment Hx: Denies past history Has alcohol/substance abuse ever caused legal problems?: No  Social Support System:   Conservation officer, natureatient's Community Support System: Poor Describe Community Support System: "Me" Type of faith/religion: None How does patient's faith help to cope with current illness?: N/A  Leisure/Recreation:   Leisure and Hobbies: Plays sports  Strengths/Needs:   What things does the patient do well?: Football, basketball  Discharge Plan:   Does patient have access to transportation?: Yes-bus Will patient be returning to same living situation after discharge?: Yes Currently receiving community  mental health services: [facility] If no, would patient like referral for services when discharged?: Yes (What  county?)([name] ) Does patient have financial barriers related to discharge medications?: Yes Patient description of barriers related to discharge medications: Limited income, no insurance      Summary/Recommendations:   Summary and Recommendations (to be completed by the evaluator): Pt is 32 year old male from [place].  Pt is diagnosed with Major Depressive Disorder with psychosis and was admitted due to intrusive auditory hallucinations.  Recommendations for pt inlcude crisis stabilization, therapeutic miliue, attend and participate in groups, medication management, and development of comprehensive mental wellness plan.  Lorri Frederick. 12/25/2017

## 2017-12-26 DIAGNOSIS — F200 Paranoid schizophrenia: Secondary | ICD-10-CM

## 2017-12-26 DIAGNOSIS — G4700 Insomnia, unspecified: Secondary | ICD-10-CM

## 2017-12-26 DIAGNOSIS — F17210 Nicotine dependence, cigarettes, uncomplicated: Secondary | ICD-10-CM

## 2017-12-26 DIAGNOSIS — F39 Unspecified mood [affective] disorder: Secondary | ICD-10-CM

## 2017-12-26 DIAGNOSIS — R443 Hallucinations, unspecified: Secondary | ICD-10-CM

## 2017-12-26 DIAGNOSIS — F1290 Cannabis use, unspecified, uncomplicated: Secondary | ICD-10-CM

## 2017-12-26 NOTE — [facility] Suicide Risk Assessment (Addendum)
[facility] INPATIENT:  Family/Significant Other Suicide Prevention Education  Suicide Prevention Education:  Contact Attempts: Ovidio HangerRonald Greer, cousin, [phone], has been identified by the patient as the family member/significant other with whom the patient will be residing, and identified as the person(s) who will aid the patient in the event of a mental health crisis.  With written consent from the patient, two attempts were made to provide suicide prevention education, prior to and/or following the patient's discharge.  We were unsuccessful in providing suicide prevention education.  A suicide education pamphlet was given to the patient to share with family/significant other.  Date and time of first attempt:12/26/17, [id number] Date and time of second attempt: 12/28/17 1043  Lorri FrederickWierda, Gregory Jon, LCSW 12/26/2017, 3:23 PM

## 2017-12-26 NOTE — Progress Notes (Signed)
Recreation Therapy Notes  INPATIENT RECREATION THERAPY ASSESSMENT  Patient Details Name: Cole Barrett MRN: [id number] DOB: 07/02/1986 Today's Date: 12/26/2017       Information Obtained From: Patient  Able to Participate in Assessment/Interview: Yes  Patient Presentation: Alert  Reason for Admission (Per Patient): Other (Comments)(Hearing voices)  Patient Stressors: Other (Comment)("Just hearing voices")  Coping Skills:   Sports, TV, Music, Exercise  Leisure Interests (2+):  Individual - TV, Music - Listen  Frequency of Recreation/Participation: Weekly  Awareness of Community Resources:  No  Expressed Interest in State Street CorporationCommunity Resource Information: No  Patient Main Form of Transportation: Walk(Pt stated he also takes public transportation or a taxi at times)  Patient Strengths:  Music, tv  Patient Identified Areas of Improvement:  "I don't know"  Current Recreation Participation:  Weekly  Patient Goal for Hospitalization:  "Get voices to calm down"  [place]  [place]  Current SI (including self-harm):  No  Current HI:  No  Current AVH: Yes(Pt rated voices at an 8.)  Staff Intervention Plan: Group Attendance  Consent to Intern Participation: N/A   Caroll RancherMarjette Graydon Fofana, LRT/CTRS  Lillia AbedLindsay, Cloud Graham A 12/26/2017, 1:18 PM

## 2017-12-26 NOTE — Progress Notes (Signed)
Recreation Therapy Notes  Date: 12/26/17 Time: 1000 Location: 500 Hall Dayroom  Group Topic: Coping Skills  Goal Area(s) Addresses:  Patient will be able to identify positive coping skills. Patient will be able to identify benefits of coping skills. Patient will be able to identify benefits of using coping skills post d/c.  Intervention: Magazines, scissors, glue sticks, construction paper, worksheet  Activity: Patients were given a worksheet divided into 5 categories (diversions, cognitive, social, tension release and physical).  Patients were to look through the magazines to find pictures to represent coping skills they could use for each category.  Education: PharmacologistCoping Skills, Building control surveyorDischarge Planning.   Education Outcome: Acknowledges understanding/In group clarification offered/Needs additional education.   Clinical Observations/Feedback: Pt did not attend group.     Caroll RancherMarjette Bernis Schreur,  LRT/CTRS         Caroll RancherLindsay, Yanina Knupp A 12/26/2017 11:56 AM

## 2017-12-26 NOTE — Progress Notes (Signed)
DAR NOTE: Patient presents with anxious affect and depressed mood. Pt has been isolating himself in the room and only come out during med pass and meals. Pt appeared preoccupied and hesitates to answer what is asked. Denies pain, auditory and visual hallucinations.  Rates depression at 0, hopelessness at 0, and anxiety at 0.  Maintained on routine safety checks.  Medications given as prescribed.  Support and encouragement offered as needed. Did not attended group.  States goal for today is "getting these voices to calm down."  Patient observed socializing with peers in the dayroom.  Offered no complaint.

## 2017-12-26 NOTE — [facility] Group Notes (Signed)
[facility] LCSW Group Therapy Note  Date/Time: 12/26/17, 1115  Type of Therapy/Topic:  Group Therapy:  Feelings about Diagnosis  Participation Level:  Minimal   Mood: pleasant   Description of Group:    This group will allow patients to explore their thoughts and feelings about diagnoses they have received. Patients will be guided to explore their level of understanding and acceptance of these diagnoses. Facilitator will encourage patients to process their thoughts and feelings about the reactions of others to their diagnosis, and will guide patients in identifying ways to discuss their diagnosis with significant others in their lives. This group will be process-oriented, with patients participating in exploration of their own experiences as well as giving and receiving support and challenge from other group members.   Therapeutic Goals: 1. Patient will demonstrate understanding of diagnosis as evidence by identifying two or more symptoms of the disorder:  2. Patient will be able to express two feelings regarding the diagnosis 3. Patient will demonstrate ability to communicate their needs through discussion and/or role plays  Summary of Patient Progress:Pt attended group but did not contribute to group discussion.  Pt did respond to CSW question but did not appear to be very engaged in the activity overall.        Therapeutic Modalities:   Cognitive Behavioral Therapy Brief Therapy Feelings Identification   Daleen SquibbGreg Leafy Motsinger, LCSW

## 2017-12-26 NOTE — [facility] Group Notes (Signed)
[facility] Group Notes:  (Nursing/MHT/Case Management/Adjunct)  Date:  12/26/2017  Time:  4:37 PM  Type of Therapy:  Psychoeducational Skills  Participation Level:  Did Not Attend  Cole Barrett 12/26/2017, 4:37 PM

## 2017-12-26 NOTE — Progress Notes (Signed)
Adult Psychoeducational Group Note  Date:  12/26/2017 Time:  2:37 AM  Group Topic/Focus:  Wrap-Up Group:   The focus of this group is to help patients review their daily goal of treatment and discuss progress on daily workbooks.  Participation Level:  Did Not Attend  Participation Quality:  Did Not Attend  Affect:  Did Not Attend  Cognitive:  Did Not Attend  Insight: None  Engagement in Group:  Did Not Attend  Modes of Intervention:  Did Not Attend  Additional Comments: Pt did not attend evening wrap up group tonight.  Felipa FurnaceChristopher  Stephine Langbehn 12/26/2017, 2:37 AM

## 2017-12-26 NOTE — [facility] Group Notes (Signed)
[facility] Group Notes:  (Nursing/MHT/Case Management/Adjunct)  Date:  12/26/2017  Time:  4:30 PM  Type of Therapy:  Psychoeducational Skills  Participation Level:  Did Not Attend  Cole Barrett 12/26/2017, 4:30 PM

## 2017-12-26 NOTE — Progress Notes (Signed)
[facility] MD Progress Note  12/26/2017 2:17 PM Cole Barrett  MRN:  [id number]  Subjective: Cole Barrett reports, "The voices are loud, telling me that they will kill me, but I'm not depressed. I'm also worried about the court date I have scheduled on 01-01-18. I can't make it to court if I'm in the hospital. I'm not ready to go home yet. I want to be in this hospital till the voices are down. The medications are good".  Objective: Cole Barrett is a 32 y/o M with history of schizophrenia who was admitted voluntarily from [facility] ED with worsening symptoms of AH. Pt had recent relevant history of presentation to [facility] on 12/22/17 and he was discharged to outpatient level of care with plan for follow up at [facility], but pt returned back to the ED seeking mood stability treatment. Today, 12-26-17, Cole Barrett is seen, chart reviewed. The chart findings dicussed with the treatment team. He is alert, oriented x 4. He is visible on the unit, attending group sessions. He is taking his medications as recommended. He denies any adverse effects. However, he continue to endorse auditory hallucinations telling him that they will kill him. Patient also reports having a court date on 01-01-18. He is worried that he is not going to be able to make this court appointment if he is in the hospital. He denies being depressed or anxious. He denies any SIHI, VH. He is tolerating his treatment regimen. No disruptive behavior reported. Patient has agreed to continue current plan of care. He wants to remain in the hospital as long as it takes him till the voices are down.  Principal Problem: Schizophrenia (HCC)  Diagnosis:   Patient Active Problem List   Diagnosis Date Noted  . Schizophrenia (HCC) [F20.9] 12/24/2017  . 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 MDD (major depressive disorder), recurrent, severe, with psychosis  (HCC) [F33.3] 11/21/2017  . MDD (major depressive disorder) [F32.9] 09/22/2017  . Alcohol abuse [F10.10]   . Auditory hallucinations [R44.0]   . Alcohol abuse with alcohol-induced mood disorder (HCC) [F10.14] 09/08/2016  . Adjustment disorder with depressed mood [F43.21] 09/07/2016  . Homelessness [Z59.0] 09/03/2016  . Person feigning illness [Z76.5] 09/03/2016  . 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 Cannabis use disorder, mild, abuse [F12.10] 08/29/2016  . Pulmonary emboli (HCC) [I26.99] 07/13/2016  . Pulmonary embolism and infarction (HCC) [I26.99] 01/02/2014  . Acute left flank pain [R10.9] 01/02/2014  . Pulmonary embolism, bilateral (HCC) [I26.99] 01/02/2014   Total Time spent with patient: 25 minutes  Past Psychiatric History: See H&P.  Past Medical History:  Past Medical History:  Diagnosis Date  . Homelessness   . Hypercholesterolemia   . Mental disorder   . Paranoid behavior (HCC)   . PE (pulmonary embolism)   . Schizophrenia (HCC)    History reviewed. No pertinent surgical history.  Family History:  Family History  Problem Relation Age of Onset  . Hypertension Mother    Family Psychiatric  History: See H&P  Social History:  Social History   Substance and Sexual Activity  Alcohol Use Yes   Comment: socially     Social History   Substance and Sexual Activity  Drug Use Yes  . Types: Marijuana   Comment: Once every 2-3 months, Last used: Last month    Social History   Socioeconomic History  . Marital status: Single    Spouse name: None  . Number of  children: None  . Years of education: None  . Highest education level: None  Social Needs  . Financial resource strain: None  . Food insecurity - worry: None  . Food insecurity - inability: None  . Transportation needs - medical: None  . Transportation needs - non-medical: None  Occupational History  . None  Tobacco Use  . Smoking status: Current Every Day Smoker    Packs/day: 0.50     Types: Cigarettes  . Smokeless tobacco: Never Used  Substance and Sexual Activity  . Alcohol use: Yes    Comment: socially  . Drug use: Yes    Types: Marijuana    Comment: Once every 2-3 months, Last used: Last month  . Sexual activity: No  Other Topics Concern  . None  Social History Narrative  . None   Additional Social History:  Pain Medications: pt denies abuse - see pta meds list Prescriptions: pt denies abuse - see pta meds list Over the Counter: pt denies abuse- see pta meds list History of alcohol / drug use?: Yes Longest period of sobriety (when/how long): less than 2 months Negative Consequences of Use: Personal relationships Withdrawal Symptoms: Patient aware of relationship between substance abuse and physical/medical complications Name of Substance 1: cannabis 1 - Age of First Use: 18 1 - Amount (size/oz): varies 1 - Frequency: "on occasion" 1 - Duration: over several years 1 - Last Use / Amount: in early Jan Name of Substance 2: etoh 2 - Age of First Use: 16 2 - Amount (size/oz): two 40 oz beers 2 - Frequency: few times a week 2 - Duration: for years 2 - Last Use / Amount: 12/23/17 - two 40 oz beers  Sleep: Good  Appetite:  Good  Current Medications: Current Facility-Administered Medications  Medication Dose Route Frequency Provider Last Rate Last Dose  . acetaminophen (TYLENOL) tablet 650 mg  650 mg Oral Q6H PRN Okonkwo, Justina A, NP      . alum & mag hydroxide-simeth (MAALOX/MYLANTA) 200-200-20 MG/5ML suspension 30 mL  30 mL Oral Q4H PRN Okonkwo, Justina A, NP      . benztropine (COGENTIN) tablet 0.5 mg  0.5 mg Oral QHS Okonkwo, Justina A, NP   0.5 mg at 12/25/17 2208  . FLUoxetine (PROZAC) capsule 20 mg  20 mg Oral Daily Okonkwo, Justina A, NP   20 mg at 12/26/17 0833  . hydrOXYzine (ATARAX/VISTARIL) tablet 25 mg  25 mg Oral TID PRN Ferne Reuskonkwo, Justina A, NP   25 mg at 12/25/17 2329  . magnesium hydroxide (MILK OF MAGNESIA) suspension 30 mL  30 mL Oral  Daily PRN Okonkwo, Justina A, NP      . risperiDONE (RISPERDAL) tablet 2 mg  2 mg Oral BID Okonkwo, Justina A, NP   2 mg at 12/26/17 16100833  . traZODone (DESYREL) tablet 50 mg  50 mg Oral QHS PRN Beryle Lathekonkwo, Justina A, NP   50 mg at 12/25/17 2208   Lab Results: No results found for this or any previous visit (from the past 48 hour(s)).  Blood Alcohol level:  Lab Results  Component Value Date   ETH <10 12/24/2017   ETH 58 (H) 12/16/2017   Metabolic Disorder Labs: Lab Results  Component Value Date   HGBA1C 5.3 11/26/2017   MPG 105.41 11/26/2017   MPG 103 08/27/2016   Lab Results  Component Value Date   PROLACTIN 63.0 (H) 11/26/2017   PROLACTIN 34.1 (H) 08/27/2016   Lab Results  Component Value Date  CHOL 250 (H) 11/26/2017   TRIG 201 (H) 11/26/2017   HDL 44 11/26/2017   CHOLHDL 5.7 11/26/2017   VLDL 40 11/26/2017   LDLCALC 166 (H) 11/26/2017   LDLCALC 81 08/27/2016   Physical Findings: AIMS: Facial and Oral Movements Muscles of Facial Expression: None, normal Lips and Perioral Area: None, normal Jaw: None, normal Tongue: None, normal,Extremity Movements Upper (arms, wrists, hands, fingers): None, normal Lower (legs, knees, ankles, toes): None, normal, Trunk Movements Neck, shoulders, hips: None, normal, Overall Severity Severity of abnormal movements (highest score from questions above): None, normal Incapacitation due to abnormal movements: None, normal Patient's awareness of abnormal movements (rate only patient's report): No Awareness, Dental Status Current problems with teeth and/or dentures?: No Does patient usually wear dentures?: No  CIWA:  CIWA-Ar Total: 4 COWS:  COWS Total Score: 2  Musculoskeletal: Strength & Muscle Tone: within normal limits Gait & Station: normal Patient leans: N/A  Psychiatric Specialty Exam: Physical Exam  Nursing note and vitals reviewed.   Review of Systems  Psychiatric/Behavioral: Positive for depression, hallucinations  (Continue to report auditory hallucinations) and substance abuse. Negative for memory loss and suicidal ideas. The patient is not nervous/anxious and does not have insomnia.     Blood pressure 109/82, pulse 95, temperature 97.7 F (36.5 C), temperature source Oral, resp. rate 18, height 5\' 11"  (1.803 m), weight 103.9 kg (229 lb), SpO2 100 %.Body mass index is 31.94 kg/m.  General Appearance:Casual and Disheveled  Eye Contact:Good  Speech:Clear and Coherent and Normal Rate  Volume:Normal  Mood:Anxious and Depressed  Affect:Appropriate, Congruent and Flat  Thought Process:Coherent and Goal Directed  Orientation:Full (Time, Place, and Person)  Thought Content:Hallucinations:Auditory  Suicidal Thoughts:No  Homicidal Thoughts:No  Memory:Immediate;Fair Recent;Fair Remote;Fair  Judgement:Fair  Insight:Fair  Psychomotor Activity:Normal  Concentration:Concentration:Fair  Recall:Fair  Fund of Knowledge:Fair  Language:Fair  Akathisia:No  Handed:   AIMS (if indicated):   Assets:Communication Skills Physical Health Resilience  ADL's:Intact  Cognition:WNL  Sleep:Number of Hours: 6.25     Treatment Plan Summary: Daily contact with patient to assess and evaluate symptoms and progress in treatment: Patient continues to report having auditory hallucinations mocking him & telling that they will kill him. He says the voices are very loud & distracting.      - Continue inpatient hospitalization.      - Will continue today 12/26/2017 plan as below except where it is noted.  Depression.     - Continue Prozac 20 mg po daily.  Anxiety.     - Continue Hydroxyzine 25 mg prn Q 6 hours.  Mood control.      - Continue Risperdal 2 mg po twice daily.  Insomnia.      - Continue Trazodone 50 mg po prn Q hs.      - Patient to continue to attend & participate in the group counseling sessions.       - The social worker to continue to  work on the discharge disposition.  Armandina Stammer, NP, PMHNP, FNP-BC 12/26/2017, 2:17 PM

## 2017-12-26 NOTE — [facility] Group Notes (Signed)
Pt did not attend and orientation goals group.

## 2017-12-27 NOTE — Progress Notes (Signed)
D:Pt reports auditory hallucinations that tell him to kill himself. Pt rates his depression and anxiety as a 0. He is cautious and suspicious. Pt's goal for today is "getting the voices down." Pt is taking his medications as prescribed. He took a shower after lunch.  A:Offered support, encouragement and 15 minute checks. R:Pt verbally denies si and hi. Safety maintained on the unit.

## 2017-12-27 NOTE — Progress Notes (Signed)
Recreation Therapy Notes  Date: 12/27/17 Time: 1000 Location: 500 Hall Dayroom  Group Topic: Stress Management  Goal Area(s) Addresses:  Patient will verbalize importance of using healthy stress management.  Patient will identify positive emotions associated with healthy stress management.   Intervention: Calm app, script  Activity :  LRT introduced the stress management techniques of meditation and guided imagery.  LRT played a meditation from the Calm app that focused on taking inventory of any sensation they may be feeling in the body.  LRT then read a script to take patients on a mental vacation to the beach.  Education:  Stress Management, Discharge Planning.   Education Outcome: Acknowledges edcuation/In group clarification offered/Needs additional education  Clinical Observations/Feedback: Pt did not attend group.    Caroll RancherMarjette Tamanna Whitson, LRT/CTRS         Caroll RancherLindsay, Makeisha Jentsch A 12/27/2017 12:23 PM

## 2017-12-27 NOTE — Progress Notes (Signed)
[facility] MD Progress Note  12/27/2017 3:55 PM Cole Barrett  MRN:  [id number] Subjective:    Cole Barrett is a 32 y/o M with history of schizophrenia who was admitted voluntarily from [facility] ED with worsening symptoms of AH. Pt had recent relevant history of presentation to [facility] on 12/22/17 and he was discharged to outpatient level of care with plan for follow up at [facility], but pt returned to ED after reporting that [facility] was closed when he attempted to go there. He was restarted on previous discharge medications of risperdal and prozac, and he has been reporting improvement of his presenting symptoms.  Today upon evaluation, pt shares, "I'm getting better." He continues, "The voices are coming down a lot and they ain't loud like they was. Everything is good." In addition to improved AH, pt denies SI/HI/VH. He is sleeping well and his appetite is good. He denies any side effects from his medications. He would like to continue his current regimen without changes. We discussed potentially discharge in the coming days if pt has ongoing improvement and stability of his symptoms, and pt was in agreement that he feels his trajectory is on track for discharge in the next few days. He had no further questions, comments, or concerns.   Principal Problem: Schizophrenia (HCC) Diagnosis:   Patient Active Problem List   Diagnosis Date Noted  . Schizophrenia (HCC) [F20.9] 12/24/2017  . 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 MDD (major depressive disorder), recurrent, severe, with psychosis (HCC) [F33.3] 11/21/2017  . MDD (major depressive disorder) [F32.9] 09/22/2017  . Alcohol abuse [F10.10]   . Auditory hallucinations [R44.0]   . Alcohol abuse with alcohol-induced mood disorder (HCC) [F10.14] 09/08/2016  . Adjustment disorder with depressed mood [F43.21] 09/07/2016  . Homelessness [Z59.0] 09/03/2016   . Person feigning illness [Z76.5] 09/03/2016  . 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 Cannabis use disorder, mild, abuse [F12.10] 08/29/2016  . Pulmonary emboli (HCC) [I26.99] 07/13/2016  . Pulmonary embolism and infarction (HCC) [I26.99] 01/02/2014  . Acute left flank pain [R10.9] 01/02/2014  . Pulmonary embolism, bilateral (HCC) [I26.99] 01/02/2014   Total Time spent with patient: 30 minutes  Past Psychiatric History: see H&P  Past Medical History:  Past Medical History:  Diagnosis Date  . Homelessness   . Hypercholesterolemia   . Mental disorder   . Paranoid behavior (HCC)   . PE (pulmonary embolism)   . Schizophrenia (HCC)    History reviewed. No pertinent surgical history. Family History:  Family History  Problem Relation Age of Onset  . Hypertension Mother    Family Psychiatric  History: see H&P Social History:  Social History   Substance and Sexual Activity  Alcohol Use Yes   Comment: socially     Social History   Substance and Sexual Activity  Drug Use Yes  . Types: Marijuana   Comment: Once every 2-3 months, Last used: Last month    Social History   Socioeconomic History  . Marital status: Single    Spouse name: None  . Number of children: None  . Years of education: None  . Highest education level: None  Social Needs  . Financial resource strain: None  . Food insecurity - worry: None  . Food insecurity - inability: None  . Transportation needs - medical: None  . Transportation needs - non-medical: None  Occupational History  . None  Tobacco Use  . Smoking status:  Current Every Day Smoker    Packs/day: 0.50    Types: Cigarettes  . Smokeless tobacco: Never Used  Substance and Sexual Activity  . Alcohol use: Yes    Comment: socially  . Drug use: Yes    Types: Marijuana    Comment: Once every 2-3 months, Last used: Last month  . Sexual activity: No  Other Topics Concern  . None  Social History Narrative  . None    Additional Social History:    Pain Medications: pt denies abuse - see pta meds list Prescriptions: pt denies abuse - see pta meds list Over the Counter: pt denies abuse- see pta meds list History of alcohol / drug use?: Yes Longest period of sobriety (when/how long): less than 2 months Negative Consequences of Use: Personal relationships Withdrawal Symptoms: Patient aware of relationship between substance abuse and physical/medical complications Name of Substance 1: cannabis 1 - Age of First Use: 18 1 - Amount (size/oz): varies 1 - Frequency: "on occasion" 1 - Duration: over several years 1 - Last Use / Amount: in early Jan Name of Substance 2: etoh 2 - Age of First Use: 16 2 - Amount (size/oz): two 40 oz beers 2 - Frequency: few times a week 2 - Duration: for years 2 - Last Use / Amount: 12/23/17 - two 40 oz beers                Sleep: Good  Appetite:  Good  Current Medications: Current Facility-Administered Medications  Medication Dose Route Frequency Provider Last Rate Last Dose  . acetaminophen (TYLENOL) tablet 650 mg  650 mg Oral Q6H PRN Okonkwo, Justina A, NP      . alum & mag hydroxide-simeth (MAALOX/MYLANTA) 200-200-20 MG/5ML suspension 30 mL  30 mL Oral Q4H PRN Okonkwo, Justina A, NP      . benztropine (COGENTIN) tablet 0.5 mg  0.5 mg Oral QHS Okonkwo, Justina A, NP   0.5 mg at 12/26/17 2231  . FLUoxetine (PROZAC) capsule 20 mg  20 mg Oral Daily Okonkwo, Justina A, NP   20 mg at 12/27/17 0805  . hydrOXYzine (ATARAX/VISTARIL) tablet 25 mg  25 mg Oral TID PRN Beryle Lathe, Justina A, NP   25 mg at 12/26/17 2232  . magnesium hydroxide (MILK OF MAGNESIA) suspension 30 mL  30 mL Oral Daily PRN Okonkwo, Justina A, NP      . risperiDONE (RISPERDAL) tablet 2 mg  2 mg Oral BID Okonkwo, Justina A, NP   2 mg at 12/27/17 0805  . traZODone (DESYREL) tablet 50 mg  50 mg Oral QHS PRN Beryle Lathe, Justina A, NP   50 mg at 12/26/17 2232    Lab Results: No results found for this or any  previous visit (from the past 48 hour(s)).  Blood Alcohol level:  Lab Results  Component Value Date   ETH <10 12/24/2017   ETH 58 (H) 12/16/2017    Metabolic Disorder Labs: Lab Results  Component Value Date   HGBA1C 5.3 11/26/2017   MPG 105.41 11/26/2017   MPG 103 08/27/2016   Lab Results  Component Value Date   PROLACTIN 63.0 (H) 11/26/2017   PROLACTIN 34.1 (H) 08/27/2016   Lab Results  Component Value Date   CHOL 250 (H) 11/26/2017   TRIG 201 (H) 11/26/2017   HDL 44 11/26/2017   CHOLHDL 5.7 11/26/2017   VLDL 40 11/26/2017   LDLCALC 166 (H) 11/26/2017   LDLCALC 81 08/27/2016    Physical Findings: AIMS: Facial and Oral Movements  Muscles of Facial Expression: None, normal Lips and Perioral Area: None, normal Jaw: None, normal Tongue: None, normal,Extremity Movements Upper (arms, wrists, hands, fingers): None, normal Lower (legs, knees, ankles, toes): None, normal, Trunk Movements Neck, shoulders, hips: None, normal, Overall Severity Severity of abnormal movements (highest score from questions above): None, normal Incapacitation due to abnormal movements: None, normal Patient's awareness of abnormal movements (rate only patient's report): No Awareness, Dental Status Current problems with teeth and/or dentures?: No Does patient usually wear dentures?: No  CIWA:  CIWA-Ar Total: 4 COWS:  COWS Total Score: 2  Musculoskeletal: Strength & Muscle Tone: within normal limits Gait & Station: normal Patient leans: N/A  Psychiatric Specialty Exam: Physical Exam  Nursing note and vitals reviewed.   Review of Systems  Constitutional: Negative for chills and fever.  Respiratory: Negative for cough and shortness of breath.   Cardiovascular: Negative for chest pain.  Gastrointestinal: Negative for abdominal pain, heartburn, nausea and vomiting.  Psychiatric/Behavioral: Positive for hallucinations. Negative for depression and suicidal ideas.    Blood pressure 117/74,  pulse 78, temperature 99.4 F (37.4 C), resp. rate 18, height 5\' 11"  (1.803 m), weight 103.9 kg (229 lb), SpO2 100 %.Body mass index is 31.94 kg/m.  General Appearance: Casual and Disheveled  Eye Contact:  Good  Speech:  Clear and Coherent and Normal Rate  Volume:  Normal  Mood:  Euthymic  Affect:  Appropriate and Congruent  Thought Process:  Coherent and Goal Directed  Orientation:  Full (Time, Place, and Person)  Thought Content:  Logical  Suicidal Thoughts:  No  Homicidal Thoughts:  No  Memory:  Immediate;   Fair Recent;   Fair Remote;   Fair  Judgement:  Fair  Insight:  Fair  Psychomotor Activity:  Normal  Concentration:  Concentration: Fair  Recall:  [employer]Fair  Fund of Knowledge:  Fair  Language:  Fair  Akathisia:  No  Handed:    AIMS (if indicated):     Assets:  Communication Skills Leisure Time Physical Health Resilience  ADL's:  Intact  Cognition:  WNL  Sleep:  Number of Hours: 6    Treatment Plan Summary: Daily contact with patient to assess and evaluate symptoms and progress in treatment and Medication management. Pt reports improvement of his presenting symptoms with combination of risperdal and zoloft, and he would like to continue his current regimen without changes. He still has some ongoing AH, and we will continue to monitor his symptoms on the inpatient unit.  - Continue inpatient hospitalization  - Schizophrenia             - Continue risperdal 2mg  BID             - Continue prozac 20mg  po qDay  -EPS             - Continue cogentin 0.5mg  qhs  - Anxiety             - Continue atarax 25mg  po TID prn anxiety  -Insomnia             - Continue trazodone 50mg  po qhs prn insomnia  -Encourage participation in groups and the therapeutic milieu  -Discharge planning will be ongoing  Micheal Likenshristopher T Kaison Mcparland, MD 12/27/2017, 3:55 PM

## 2017-12-27 NOTE — Plan of Care (Signed)
Pt is taking medications without complaint while in the hospital.

## 2017-12-27 NOTE — [facility] Group Notes (Signed)
LCSW Group Therapy Note   12/27/2017 1:15pm   Type of Therapy and Topic:  Group Therapy:  Trust and Honesty  Participation Level:  Did Not Attend  Description of Group:    In this group patients will be asked to explore the value of being honest.  Patients will be guided to discuss their thoughts, feelings, and behaviors related to honesty and trusting in others. Patients will process together how trust and honesty relate to forming relationships with peers, family members, and self. Each patient will be challenged to identify and express feelings of being vulnerable. Patients will discuss reasons why people are dishonest and identify alternative outcomes if one was truthful (to self or others). This group will be process-oriented, with patients participating in exploration of their own experiences, giving and receiving support, and processing challenge from other group members.   Therapeutic Goals: 1. Patient will identify why honesty is important to relationships and how honesty overall affects relationships.  2. Patient will identify a situation where they lied or were lied too and the  feelings, thought process, and behaviors surrounding the situation 3. Patient will identify the meaning of being vulnerable, how that feels, and how that correlates to being honest with self and others. 4. Patient will identify situations where they could have told the truth, but instead lied and explain reasons of dishonesty.   Summary of Patient Progress    Therapeutic Modalities:   Cognitive Behavioral Therapy Solution Focused Therapy Motivational Interviewing Brief Therapy  Janiaya Ryser B Claudean Leavelle, LCSW 12/27/2017 1:15 PM  

## 2017-12-27 NOTE — Plan of Care (Signed)
  Coping: Ability to identify and develop effective coping behavior will improve 12/27/2017 2153 - Progressing by Delos HaringPhillips, Leesha Veno A, RN Note Pt seen interacting with peers minimally on unit and sitting in the dayroom some of the evening

## 2017-12-27 NOTE — Progress Notes (Signed)
D: Pt denies SI/HI/VH. Pt is pleasant and cooperative. Pt stated the AH are very low. Pt said he was doing much better.   A: Pt was offered support and encouragement. Pt was given scheduled medications. Pt was encourage to attend groups. Q 15 minute checks were done for safety.   R:Pt attends groups and interacts well with peers and staff. Pt is taking medication. Pt receptive to treatment and safety maintained on unit.

## 2017-12-27 NOTE — Progress Notes (Signed)
[facility] Group Notes:  (Nursing/MHT/Case Management/Adjunct)  Date:  12/27/2017  Time:  5:58 PM  Type of Therapy:  Nurse Education  Participation Level:  Did Not Attend  Participation Quality:    Affect:    Cognitive:    Insight:    Engagement in Group:    Modes of Intervention:    Summary of Progress/Problems:  Beatrix ShipperWright, Terrisha Lopata Martin 12/27/2017, 5:58 PM

## 2017-12-28 MED ORDER — FLUOXETINE HCL 20 MG PO CAPS: 20.0000 mg | ORAL_CAPSULE | Freq: Every day | ORAL | 0 refills | Status: DC

## 2017-12-28 MED ORDER — TRAZODONE HCL 50 MG PO TABS: 50.0000 mg | ORAL_TABLET | Freq: Every evening | ORAL | 0 refills | Status: DC | PRN

## 2017-12-28 MED ORDER — RISPERIDONE 2 MG PO TABS: 2.0000 mg | ORAL_TABLET | Freq: Two times a day (BID) | ORAL | 0 refills | Status: DC

## 2017-12-28 MED ORDER — HYDROXYZINE HCL 25 MG PO TABS: 25.0000 mg | ORAL_TABLET | Freq: Three times a day (TID) | ORAL | 0 refills | Status: DC | PRN

## 2017-12-28 MED ORDER — BENZTROPINE MESYLATE 0.5 MG PO TABS: 0.5000 mg | ORAL_TABLET | Freq: Every day | ORAL | 0 refills | Status: DC

## 2017-12-28 NOTE — Progress Notes (Signed)
Recreation Therapy Notes  INPATIENT RECREATION TR PLAN  Patient Details Name: Cole Barrett MRN: [id number] DOB: 1986-09-27 Today's Date: 12/28/2017  Rec Therapy Plan Is patient appropriate for Therapeutic Recreation?: Yes Treatment times per week: about 3 days Estimated Length of Stay: 5-7 days TR Treatment/Interventions: Group participation (Comment)  Discharge Criteria Pt will be discharged from therapy if:: Discharged Treatment plan/goals/alternatives discussed and agreed upon by:: Patient/family  Discharge Summary Short term goals set: Pt will attend and participate in recreation therapy sessions. Short term goals met: Not met Reason goals not met: Pt did not attend groups. Therapeutic equipment acquired: N/A Reason patient discharged from therapy: Discharge from hospital Pt/family agrees with progress & goals achieved: Yes Date patient discharged from therapy: 12/28/17    Victorino Sparrow, LRT/CTRS  Ria Comment, Jovie Swanner A 12/28/2017, 12:00 PM

## 2017-12-28 NOTE — Progress Notes (Signed)
  [facility] Group Notes:  (Nursing/MHT/Case Management/Adjunct)  Date:  [id number]  Time:  2000  Type of Therapy:  Group Therapy  Participation Level:  Active  Participation Quality:  Appropriate  Affect:  Blunted and Depressed  Cognitive:  Appropriate  Insight:  Appropriate  Engagement in Group:  Engaged  Modes of Intervention:  Discussion  Summary of Progress/Problems: Goal- "try to get these voices down". Informed the writer that the voices were down "a little".  When asked if there was anything the staff could do to make tomorrow better, pt stated,"no".  Fransico MichaelBrooks, [place]illah Didonato [facility] 12/28/2017, 4:23 AM

## 2017-12-28 NOTE — Plan of Care (Signed)
Pt did not attend groups.   Shannyn Jankowiak, LRT/CTRS 

## 2017-12-28 NOTE — Progress Notes (Signed)
Pt d/c from the hospital with a bus pass. All items returned. D/C instructions given, prescriptions given and samples given. Pt denies si and hi. 

## 2017-12-28 NOTE — Discharge Summary (Signed)
Physician Discharge Summary Note  Patient:  Cole Barrett is an 32 y.o., male MRN:  [id number] DOB:  1986-08-05 Patient phone:  [phone] (home)  Patient address:   [address] [place] [address],  Total Time spent with patient: Greater than 30 minutes  Date of Admission:  12/24/2017 Date of Discharge: 12/28/2017  Reason for Admission: Worsening psychosis (auditory hallucinations).   Principal Problem: Schizophrenia [facility])  Discharge Diagnoses: Patient Active Problem List   Diagnosis Date Noted  . Schizophrenia (HCC) [F20.9] 12/24/2017  . 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 MDD (major depressive disorder), recurrent, severe, with psychosis (HCC) [F33.3] 11/21/2017  . MDD (major depressive disorder) [F32.9] 09/22/2017  . Alcohol abuse [F10.10]   . Auditory hallucinations [R44.0]   . Alcohol abuse with alcohol-induced mood disorder (HCC) [F10.14] 09/08/2016  . Adjustment disorder with depressed mood [F43.21] 09/07/2016  . Homelessness [Z59.0] 09/03/2016  . Person feigning illness [Z76.5] 09/03/2016  . 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 Cannabis use disorder, mild, abuse [F12.10] 08/29/2016  . Pulmonary emboli (HCC) [I26.99] 07/13/2016  . Pulmonary embolism and infarction (HCC) [I26.99] 01/02/2014  . Acute left flank pain [R10.9] 01/02/2014  . Pulmonary embolism, bilateral (HCC) [I26.99] 01/02/2014   Past Psychiatric History: Mdd with psychosis.  Past Medical History:  Past Medical History:  Diagnosis Date  . Homelessness   . Hypercholesterolemia   . Mental disorder   . Paranoid behavior (HCC)   . PE (pulmonary embolism)   . Schizophrenia (HCC)    History reviewed. No pertinent surgical history.  Family History:  Family History  Problem Relation Age of Onset  . Hypertension Mother    Family Psychiatric  History: See H&P  Social History:  Social  History   Substance and Sexual Activity  Alcohol Use Yes   Comment: socially     Social History   Substance and Sexual Activity  Drug Use Yes  . Types: Marijuana   Comment: Once every 2-3 months, Last used: Last month    Social History   Socioeconomic History  . Marital status: Single    Spouse name: None  . Number of children: None  . Years of education: None  . Highest education level: None  Social Needs  . Financial resource strain: None  . Food insecurity - worry: None  . Food insecurity - inability: None  . Transportation needs - medical: None  . Transportation needs - non-medical: None  Occupational History  . None  Tobacco Use  . Smoking status: Current Every Day Smoker    Packs/day: 0.50    Types: Cigarettes  . Smokeless tobacco: Never Used  Substance and Sexual Activity  . Alcohol use: Yes    Comment: socially  . Drug use: Yes    Types: Marijuana    Comment: Once every 2-3 months, Last used: Last month  . Sexual activity: No  Other Topics Concern  . None  Social History Narrative  . None   Hospital Course: (Per admission assessment): Cole Barrett is a 32 y/o M with history of schizophrenia who was admitted voluntarily from [facility] ED with worsening symptoms of AH. Pt had recent relevant history of presentation [facility] and he was discharged to outpatient level of care with plan for follow up at [facility], but pt returned to ED after reporting that [facility] was closed when he attempted to go there. Upon evaluation, pt shares, "I went  to the hospital Friday and they told me to go to [facility], but it wasn't open when I went. The voices were up real loud and I couldn't stand it, so I went back to the hospital, and they sent me here." Pt reports worsening AH for the past 3 days without specific trigger or stressor which is causing them to worsen. AH are not command in nature but they are distressing to the patient, as he explains, "They tell me that  they are gonna kill me".  This is one of several discharged summaries from this psychiatric hospital alone for [facility]. He is known to our unit from previous hospitalizations all related to worsening psychosis. He was admitted to the [facility] this time for mood stabilization treatment due to worsening auditory hallucinations that were distressing to the patient because the voices were saying that they will kill.   After evaluation of his presenting symptoms, Cole Barrett was re-started on the medication regimen for the presenting symptoms. He received & was discharged on; Risperdal 2 mg for mood control, Prozac 20 mg for depression, hydroxyzine 25 mg prn for anxiety & Trazodone 50 mg prn for insomnia. He was also enrolled & participated in the group counseling sessions being offered & held on this unit. He learned coping skills that should make him cope better to maintain mood stability after discharge. He presented no other significant health issues that required treatment & or monitoring. Cole Barrett tolerated his treatment regimen without any adverse effects or reactions reported.  Cole Barrett is seen today for discharge by the attending psychiatrist. He says he has normal anxiety about going home. He is not overwhelmed by this anxiety. He is looking forward to working on his mental health issues by taking his recommended medications. He is not expressing any delusions today. No hallucination. Feels in control of himself.  No suicidal thoughts. Looking forward to going home with his family. No thoughts of violence. Does not feel depressed. No evidence of mania.  The nursing staff reports that patient has been appropriate on the unit. Patient has been interacting well with peers. No behavioral issues. Patient has not voiced any suicidal thoughts. Patient has not been observed to be internally stimulated or preoccupied. Patient has been adherent with his treatment recommendations. Patient has been tolerating  his medications well. No reported adverse effects or reactions.   Patient was discussed at the treatment team meeting this morning. The team members feel that patient is back to his baseline level of function. The team agreed with plan to discharge patient today to continue mental health treatment on an outpatient basis as noted below. He was provided with a 7 days worth, supply samples of his [facility] discharge medications. He left [facility] with all personal belongings in no apparent distress. Transportation per the city bus. [facility] assisted with bus pass.    Physical Findings: AIMS: Facial and Oral Movements Muscles of Facial Expression: None, normal Lips and Perioral Area: None, normal Jaw: None, normal Tongue: None, normal,Extremity Movements Upper (arms, wrists, hands, fingers): None, normal Lower (legs, knees, ankles, toes): None, normal, Trunk Movements Neck, shoulders, hips: None, normal, Overall Severity Severity of abnormal movements (highest score from questions above): None, normal Incapacitation due to abnormal movements: None, normal Patient's awareness of abnormal movements (rate only patient's report): No Awareness, Dental Status Current problems with teeth and/or dentures?: No Does patient usually wear dentures?: No  CIWA:  CIWA-Ar Total: 4 COWS:  COWS Total Score: 2  Musculoskeletal: Strength & Muscle Tone: within normal  limits Gait & Station: normal Patient leans: N/A  Psychiatric Specialty Exam: Physical Exam  Constitutional: He appears well-developed.  HENT:  Head: Normocephalic.  Eyes: Pupils are equal, round, and reactive to light.  Neck: Normal range of motion.  Cardiovascular: Normal rate.  Respiratory: Effort normal.  GI: Soft.  Genitourinary:  Genitourinary Comments: Deferred  Musculoskeletal: Normal range of motion.  Neurological: He is alert.  Skin: Skin is warm.    Review of Systems  Constitutional: Negative.   HENT: Negative.   Eyes: Negative.    Respiratory: Negative.   Cardiovascular: Negative.   Gastrointestinal: Negative.   Genitourinary: Negative.   Musculoskeletal: Negative.   Skin: Negative.   Neurological: Negative.   Endo/Heme/Allergies: Negative.   Psychiatric/Behavioral: Positive for depression (Stable), hallucinations (Hx. Psychosis) and substance abuse (Hx. Alcohol & THC use disorder). Negative for memory loss and suicidal ideas. The patient is nervous/anxious and has insomnia.   All other systems reviewed and are negative.   Blood pressure 94/64, pulse 78, temperature 97.6 F (36.4 C), temperature source Oral, resp. rate 18, height 5\' 11"  (1.803 m), weight 103.9 kg (229 lb), SpO2 100 %.Body mass index is 31.94 kg/m.  See Md's SRA  Have you used any form of tobacco in the last 30 days? (Cigarettes, Smokeless Tobacco, Cigars, and/or Pipes): Yes  Has this patient used any form of tobacco in the last 30 days? (Cigarettes, Smokeless Tobacco, Cigars, and/or Pipes) NO  Blood Alcohol level:  Lab Results  Component Value Date   ETH <10 12/24/2017   ETH 58 (H) 12/16/2017   Metabolic Disorder Labs:  Lab Results  Component Value Date   HGBA1C 5.3 11/26/2017   MPG 105.41 11/26/2017   MPG 103 08/27/2016   Lab Results  Component Value Date   PROLACTIN 63.0 (H) 11/26/2017   PROLACTIN 34.1 (H) 08/27/2016   Lab Results  Component Value Date   CHOL 250 (H) 11/26/2017   TRIG 201 (H) 11/26/2017   HDL 44 11/26/2017   CHOLHDL 5.7 11/26/2017   VLDL 40 11/26/2017   LDLCALC 166 (H) 11/26/2017   LDLCALC 81 08/27/2016   See Psychiatric Specialty Exam and Suicide Risk Assessment completed by Attending Physician prior to discharge.  Discharge destination:  Home  Is patient on multiple antipsychotic therapies at discharge:  No   Has Patient had three or more failed trials of antipsychotic monotherapy by history:  No  Recommended Plan for Multiple Antipsychotic Therapies: NA  Allergies as of 12/28/2017      Reactions    Haldol [haloperidol] Shortness Of Breath      Medication List    STOP taking these medications   benztropine 0.5 MG tablet Commonly known as:  COGENTIN   FLUoxetine 20 MG capsule Commonly known as:  PROZAC   risperiDONE 2 MG tablet Commonly known as:  RISPERDAL   risperiDONE 3 MG tablet Commonly known as:  RISPERDAL      Follow-up Information    [facility] Follow up on 01/02/2018.   Why:  Follow up is scheduled for Tuesday February 5th, 2019 at 9:15am.  Please bring your ID and proof of insurance if available.  Contact information: [address] [place] [address] [phone]          Follow-up recommendations: Activity:  As tolerated Diet: As recommended by your primary care doctor. Keep all scheduled follow-up appointments as recommended.   Comments: Patient is instructed prior to discharge to: Take all medications as prescribed by his/her mental healthcare provider. Report any  adverse effects and or reactions from the medicines to his/her outpatient provider promptly. Patient has been instructed & cautioned: To not engage in alcohol and or illegal drug use while on prescription medicines. In the event of worsening symptoms, patient is instructed to call the crisis hotline, 911 and or go to the nearest ED for appropriate evaluation and treatment of symptoms. To follow-up with his/her primary care provider for your other medical issues, concerns and or health care needs.   Signed: Armandina StammerAgnes Nwoko, NP, PMHNP, FNP-BC 12/30/2017, 11:15 AM   Patient seen, Suicide Assessment Completed.  Disposition Plan Reviewed   Cole BrasilChristopher Barrett is a 32 y/o M with history of schizophrenia who [facility] with worsening symptoms of AH. Pt had recent relevant history of presentation to [facility] and he was discharged to outpatient level of care with [facility], but pt [facility] ED [facility]  [facility] to [facility].He was restarted on previous discharge medications of risperdal and prozac, and he has been reporting improvement of his presenting symptoms.  Today upon evaluation, pt shares, "I'm doing real good today." Pt is bright in affect and pleasant. He reports that AH have been silent and he denies SI/HI/VH. He is sleeping well and his appetite is good. He has no side effects from his medications. He is in agreement to continue his current regimen without changes and [facility]. He was able to engage in safety planning including plan [facility] or contact emergency services if he feels unable to maintain his own safety or the safety of others. Pt had no further questions, comments, or concerns.  Plan Of Care/Follow-up recommendations:   -Discharge to outpatient level of care  - Schizophrenia - Continue risperdal 2mg  BID - Continue prozac 20mg  po qDay  -EPS - Continue cogentin 0.5mg  qhs  - Anxiety - Continue atarax 25mg  po TID prn anxiety  -Insomnia - Continue trazodone 50mg  po qhs prn insomnia  Activity:  as tolerated Diet:  normal Tests:  NA Other:  see above for DC plan  Cole Likenshristopher T Tymeshia Awan, MD

## 2017-12-28 NOTE — Progress Notes (Signed)
  [facility] Discharge Plan :  Will you be returning to the same living situation after discharge:  Yes,  Home At discharge, do you have transportation home?: Yes,  Public transportation  Do you have the ability to pay for your medications: Yes,  Family   Release of information consent forms completed and in the chart;  Patient's signature needed at discharge.  Patient to Follow up at: Follow-up Information    [facility] Follow up on 01/02/2018.   Why:  Follow up is scheduled for Tuesday February 5th, 2019 at 9:15am.  Please bring your ID and proof of insurance if available.  Contact information: [address] [place] [address] [phone]           Next level of care provider has [facility] Link:no  Safety Planning and Suicide Prevention discussed: Yes,  Yes  Have you used any form of tobacco in the last 30 days? (Cigarettes, Smokeless Tobacco, Cigars, and/or Pipes): Yes  Has patient been referred to the Quitline?: Patient refused referral  Patient has been referred for addiction treatment: Pt. refused referral  Cole Barrett, Student-Social Work 12/28/2017, 10:54 AM

## 2017-12-28 NOTE — [facility] Suicide Risk Assessment (Signed)
[facility] Discharge Suicide [facility]   Principal Problem: Schizophrenia [facility](HCC) Discharge Diagnoses:  Patient Active Problem List   Diagnosis Date Noted  . Schizophrenia (HCC) [F20.9] 12/24/2017  . 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 MDD (major depressive disorder), recurrent, severe, with psychosis (HCC) [F33.3] 11/21/2017  . MDD (major depressive disorder) [F32.9] 09/22/2017  . Alcohol abuse [F10.10]   . Auditory hallucinations [R44.0]   . Alcohol abuse with alcohol-induced mood disorder (HCC) [F10.14] 09/08/2016  . Adjustment disorder with depressed mood [F43.21] 09/07/2016  . Homelessness [Z59.0] 09/03/2016  . Person feigning illness [Z76.5] 09/03/2016  . 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 Cannabis use disorder, mild, abuse [F12.10] 08/29/2016  . Pulmonary emboli (HCC) [I26.99] 07/13/2016  . Pulmonary embolism and infarction (HCC) [I26.99] 01/02/2014  . Acute left flank pain [R10.9] 01/02/2014  . Pulmonary embolism, bilateral (HCC) [I26.99] 01/02/2014    Total Time spent with patient: 30 minutes  Musculoskeletal: Strength & Muscle Tone: within normal limits Gait & Station: normal Patient leans: N/A  Psychiatric Specialty Exam: Review of Systems  Constitutional: Negative for chills and fever.  Respiratory: Negative for cough.   Cardiovascular: Negative for chest pain.  Gastrointestinal: Negative for abdominal pain, heartburn, nausea and vomiting.  Psychiatric/Behavioral: Negative for depression, hallucinations and suicidal ideas. The patient is not nervous/anxious.     Blood pressure 94/64, pulse 78, temperature 97.6 F (36.4 C), temperature source Oral, resp. rate 18, height 5\' 11"  (1.803 m), weight 103.9 kg (229 lb), SpO2 100 %.Body mass index is 31.94 kg/m.  General Appearance: Casual and Fairly Groomed  Patent attorneyye Contact::  Good  Speech:  Clear and Coherent and  Normal Rate  Volume:  Normal  Mood:  Euthymic  Affect:  Appropriate, Congruent and Flat  Thought Process:  Coherent and Goal Directed  Orientation:  Full (Time, Place, and Person)  Thought Content:  Logical  Suicidal Thoughts:  No  Homicidal Thoughts:  No  Memory:  Immediate;   Good Recent;   Good Remote;   Good  Judgement:  Fair  Insight:  Fair  Psychomotor Activity:  Normal  Concentration:  Fair  Recall:  FiservFair  Fund of Knowledge:Fair  Language: Fair  Akathisia:  No  Handed:    AIMS (if indicated):     Assets:  Manufacturing systems engineerCommunication Skills Physical Health Resilience Social Support  Sleep:  Number of Hours: 5.5  Cognition: WNL  ADL's:  Intact   Mental Status Per Nursing Assessment::   On Admission:     Demographic Factors:  Male and Low socioeconomic status  Loss Factors: Financial problems/change in socioeconomic status  Historical Factors: Impulsivity  Risk Reduction Factors:   Positive social support, Positive therapeutic relationship and Positive coping skills or problem solving skills  Continued Clinical Symptoms:  Schizophrenia:   Paranoid or undifferentiated type  Cognitive Features That Contribute To Risk:  None    Suicide Risk:  Minimal: No identifiable suicidal ideation.  Patients presenting with no risk factors but with morbid ruminations; may be classified as minimal risk based on the severity of the depressive symptoms  Follow-up Information    [facility] Follow up on 01/02/2018.   Why:  Follow up is scheduled for Tuesday February 5th, 2019 at 9:15am.  Please bring your ID and proof of insurance if available.  Contact information: [address] [place] [address] [phone]         Subjective Data:  Cole BrasilChristopher Barrett is a  32 y/o M with history of schizophrenia who was admitted voluntarily from [facility] ED with worsening symptoms of AH. Pt had recent relevant history of presentation [facility] and he was discharged to outpatient  level of care with plan for follow up at [facility], but pt returned to ED after reporting that [facility] was closed when he attempted to go there.He was restarted on previous discharge medications of risperdal and prozac, and he has been reporting improvement of his presenting symptoms.  Today upon evaluation, pt shares, "I'm doing real good today." Pt is bright in affect and pleasant. He reports that AH have been silent and he denies SI/HI/VH. He is sleeping well and his appetite is good. He has no side effects from his medications. He is in agreement to continue his current regimen without changes and he plans to follow up at [facility]. He was able to engage in safety planning including plan to return to [facility] or contact emergency services if he feels unable to maintain his own safety or the safety of others. Pt had no further questions, comments, or concerns.   Plan Of Care/Follow-up recommendations:   - Discharge to outpatient level of care  - Schizophrenia - Continue risperdal 2mg  BID - Continue prozac 20mg  po qDay  -EPS - Continue cogentin 0.5mg  qhs  - Anxiety - Continue atarax 25mg  po TID prn anxiety  -Insomnia - Continue trazodone 50mg  po qhs prn insomnia  Activity:  as tolerated Diet:  normal Tests:  NA Other:  see above for DC plan  Micheal Likens, MD 12/28/2017, 10:39 AM

## 2017-12-28 NOTE — Progress Notes (Signed)
Recreation Therapy Notes  Date: 12/28/17 Time: 1000 Location: 500 [address] Dayroom  Group Topic: Self-Esteem  Goal Area(s) Addresses:  Patient will successfully identify positive attributes about themselves.  Patient will successfully identify benefit of improved self-esteem.   Intervention: Construction paper, markers, colored pencils  Activity: Brochure About Me:  Patients were to create a brochure to highlight the things that make them unique and set them apart from everyone else.  Patients could highlight things such as biggest accomplishment, favorite feature, important dates, best quality, etc.  Education:  Self-Esteem, Discharge Planning.   Education Outcome: Acknowledges education/In group clarification offered/Needs additional education  Clinical Observations/Feedback: Pt did not attend group.    Hedda Crumbley, LRT/CTRS         Gerard Cantara A 12/28/2017 12:38 PM 

## 2017-12-29 ENCOUNTER — Emergency Department (HOSPITAL_COMMUNITY)
Admission: EM | Admit: 2017-12-29 | Discharge: 2017-12-30 | Disposition: A | Discharge status: Home / Self Care | Payer: Self-pay | Attending: Emergency Medicine | Admitting: Emergency Medicine

## 2017-12-29 ENCOUNTER — Other Ambulatory Visit: Payer: Self-pay

## 2017-12-29 ENCOUNTER — Encounter (HOSPITAL_COMMUNITY): Payer: Self-pay | Admitting: *Deleted

## 2017-12-29 DIAGNOSIS — Z86711 Personal history of pulmonary embolism: Secondary | ICD-10-CM | POA: Insufficient documentation

## 2017-12-29 DIAGNOSIS — F209 Schizophrenia, unspecified: Secondary | ICD-10-CM | POA: Insufficient documentation

## 2017-12-29 DIAGNOSIS — Z590 Homelessness: Secondary | ICD-10-CM | POA: Insufficient documentation

## 2017-12-29 DIAGNOSIS — R440 Auditory hallucinations: Secondary | ICD-10-CM | POA: Insufficient documentation

## 2017-12-29 DIAGNOSIS — F17210 Nicotine dependence, cigarettes, uncomplicated: Secondary | ICD-10-CM | POA: Insufficient documentation

## 2017-12-29 MED ORDER — RISPERIDONE 2 MG PO TABS: 2.0000 mg | ORAL_TABLET | Freq: Two times a day (BID) | ORAL | 0 refills | Status: DC

## 2017-12-29 MED ORDER — ONDANSETRON HCL 4 MG PO TABS
4.0000 mg | ORAL_TABLET | Freq: Three times a day (TID) | ORAL | Status: DC | PRN
Start: 2017-12-29 — End: 2017-12-29

## 2017-12-29 MED ORDER — BENZTROPINE MESYLATE 0.5 MG PO TABS: 0.5000 mg | ORAL_TABLET | Freq: Every day | ORAL | 0 refills | Status: DC

## 2017-12-29 MED ORDER — TRAZODONE HCL 50 MG PO TABS: 50.0000 mg | ORAL_TABLET | Freq: Every evening | ORAL | 0 refills | Status: DC | PRN

## 2017-12-29 MED ORDER — HYDROXYZINE HCL 25 MG PO TABS: 25.0000 mg | ORAL_TABLET | Freq: Three times a day (TID) | ORAL | 0 refills | Status: DC | PRN

## 2017-12-29 MED ORDER — NICOTINE 21 MG/24HR TD PT24: 21.0000 mg | MEDICATED_PATCH | Freq: Every day | TRANSDERMAL | Status: DC

## 2017-12-29 MED ORDER — FLUOXETINE HCL 20 MG PO CAPS: 20.0000 mg | ORAL_CAPSULE | Freq: Every day | ORAL | 0 refills | Status: DC

## 2017-12-29 MED ORDER — ALUM & MAG HYDROXIDE-SIMETH 200-200-20 MG/5ML PO SUSP: 30.0000 mL | Freq: Four times a day (QID) | ORAL | Status: DC | PRN

## 2017-12-29 MED ORDER — IBUPROFEN 200 MG PO TABS: 600.0000 mg | ORAL_TABLET | Freq: Three times a day (TID) | ORAL | Status: DC | PRN

## 2017-12-29 NOTE — Discharge Instructions (Signed)
1. Medications: Cogentin, Prozac, Atarax, Risperdal, trazodone, usual home medications 2. Treatment: rest, drink plenty of fluids,  3. Follow Up: Please followup with [facility] today or Monday as directed; if you do not have a primary care doctor use the resource guide provided to find one; Please return to the ER for if you feel unsafe, becomes suicidal, homicidal or have other concerns.

## 2017-12-29 NOTE — ED Provider Notes (Signed)
[facility]-EMERGENCY DEPT Provider Note   CSN: [id number] Arrival date & time: 12/29/17  2145     History   Chief Complaint Chief Complaint  Patient presents with  . Hallucinations  . Homeless    HPI Cole Barrett is a 32 y.o. male with a hx of schizophrenia, depression, homelessness presents to the Emergency Department complaining of gradual, persistent, progressively worsening auditory hallucinations onset 2 days ago.  Reports he often has auditory hallucinations.  Per the record review patient was seen on 12/24/2017 for worsening auditory hallucinations.  Patient was evaluated and was placed inpatient.  He reports the voices improved while he was in the hospital.  After he was discharged he lost his prescription and therefore did not fill it.  He states that because he was not taking the medication, the voices returned.  He states the voices report they are going to kill him.  He denies feelings of suicidal or homicidal ideation.  He is visual hallucinations.  Patient denies the voices encouraging him to kill anyone else.  No specific aggravating or alleviating factors.  He denies fever, chills, headache, neck pain, chest pain, shortness of breath, abdominal pain, nausea, vomiting, diarrhea, weakness, dizziness, syncope, dysuria.  The history is provided by the patient and medical records. No language interpreter was used.    Past Medical History:  Diagnosis Date  . Homelessness   . Hypercholesterolemia   . Mental disorder   . Paranoid behavior (HCC)   . PE (pulmonary embolism)   . Schizophrenia [facility](HCC)     Patient Active Problem List   Diagnosis Date Noted  . Schizophrenia (HCC) 12/24/2017  . 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 Schizophrenia, paranoid (HCC)   . 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 MDD (major depressive disorder) 09/22/2017  . Alcohol abuse     . Auditory hallucinations   . Alcohol abuse with alcohol-induced mood disorder (HCC) 09/08/2016  . Adjustment disorder with depressed mood 09/07/2016  . Homelessness 09/03/2016  . Person feigning illness 09/03/2016  . 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 Cannabis use disorder, mild, abuse 08/29/2016  . Pulmonary emboli (HCC) 07/13/2016  . 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 Acute left flank pain 01/02/2014  . Pulmonary embolism, bilateral (HCC) 01/02/2014    History reviewed. No pertinent surgical history.     Home Medications    Prior to Admission medications   Medication Sig Start Date End Date Taking? Authorizing Provider  hydrOXYzine (ATARAX/VISTARIL) 25 MG tablet Take 1 tablet (25 mg total) by mouth 3 (three) times daily as needed for anxiety. 12/28/17  Yes Armandina StammerNwoko, Agnes I, NP  benztropine (COGENTIN) 0.5 MG tablet Take 1 tablet (0.5 mg total) by mouth at bedtime. For prevention of drug induced tremors 12/29/17   Tonja Jezewski, Dahlia ClientHannah, PA-C  FLUoxetine (PROZAC) 20 MG capsule Take 1 capsule (20 mg total) by mouth daily. For depression 12/29/17   Briceson Broadwater, Dahlia ClientHannah, PA-C  risperiDONE (RISPERDAL) 2 MG tablet Take 1 tablet (2 mg total) by mouth 2 (two) times daily. For mood control 12/29/17   Apoorva Bugay, Dahlia ClientHannah, PA-C  traZODone (DESYREL) 50 MG tablet Take 1 tablet (50 mg total) by mouth at bedtime as needed for sleep. 12/29/17   Peniel Hass, Dahlia ClientHannah, PA-C    Family History Family History  Problem Relation Age of Onset  . Hypertension Mother     Social History Social History   Tobacco Use  . Smoking status:  Current Every Day Smoker    Packs/day: 0.50    Types: Cigarettes  . Smokeless tobacco: Never Used  Substance Use Topics  . Alcohol use: Yes    Comment: socially  . Drug use: Yes    Types: Marijuana    Comment: Once every 2-3 months, Last used: Last month     Allergies   Haldol [haloperidol]   Review of Systems Review of Systems   Constitutional: Negative for appetite change, diaphoresis, fatigue, fever and unexpected weight change.  HENT: Negative for mouth sores.   Eyes: Negative for visual disturbance.  Respiratory: Negative for cough, chest tightness, shortness of breath and wheezing.   Cardiovascular: Negative for chest pain.  Gastrointestinal: Negative for abdominal pain, constipation, diarrhea, nausea and vomiting.  Endocrine: Negative for polydipsia, polyphagia and polyuria.  Genitourinary: Negative for dysuria, frequency, hematuria and urgency.  Musculoskeletal: Negative for back pain and neck stiffness.  Skin: Negative for rash.  Allergic/Immunologic: Negative for immunocompromised state.  Neurological: Negative for syncope, light-headedness and headaches.  Hematological: Does not bruise/bleed easily.  Psychiatric/Behavioral: Positive for hallucinations. Negative for sleep disturbance. The patient is not nervous/anxious.      Physical Exam Updated Vital Signs BP (!) 132/97 (BP Location: Left Arm)   Pulse 94   Temp 98.2 F (36.8 C) (Oral)   Resp 19   Ht 5\' 10"  (1.778 m)   Wt 99.8 kg (220 lb)   SpO2 97%   BMI 31.57 kg/m   Physical Exam  Constitutional: He appears well-developed and well-nourished. No distress.  HENT:  Head: Normocephalic and atraumatic.  Right Ear: External ear normal.  Left Ear: External ear normal.  Nose: Nose normal.  Eyes: Conjunctivae are normal. No scleral icterus.  Neck: Normal range of motion. Neck supple.  Cardiovascular: Normal rate, regular rhythm and intact distal pulses.  Pulmonary/Chest: Effort normal and breath sounds normal.  Abdominal: Soft. He exhibits no distension. There is no tenderness.  Musculoskeletal: Normal range of motion.  Neurological: He is alert.  Skin: Skin is warm and dry.  Psychiatric: His speech is normal. He is withdrawn and actively hallucinating. He expresses no homicidal and no suicidal ideation. He expresses no suicidal plans and no  homicidal plans.  Nursing note and vitals reviewed.    ED Treatments / Results   Procedures Procedures (including critical care time)  Medications Ordered in ED Medications - No data to display   Initial Impression / Assessment and Plan / ED Course  I have reviewed the triage vital signs and the nursing notes.  Pertinent labs & imaging results that were available during my care of the patient were reviewed by me and considered in my medical decision making (see chart for details).  Clinical Course as of Dec 30 2323  Fri Dec 29, 2017  2254 Pt doesn't meet inpatient criteria.    [HM]  2254 Discharge Plan Of Care/Follow-up recommendations from [facility] yesterday:  -Discharge to outpatient level of care - Schizophrenia - Continue risperdal 2mg  BID - Continue prozac 20mg  po qDay -EPS - Continue cogentin 0.5mg  qhs - Anxiety - Continue atarax 25mg  po TID prn anxiety -Insomnia - Continue trazodone 50mg  po qhs prn insomnia  [HM]  2311 Long discussion with patient.  He reports that he was indeed given both samples and prescriptions however he left them at a friend's house and he will not be able to get them for several weeks.   [HM]    Clinical Course User Index [HM] Duwane Gewirtz, Dahlia Client, [place]  Same has not been taking his medications recently.  Will need TTS evaluation.    11:23 PM Discussed with patient that he does not currently meet inpatient criteria.  He reports that he really just needs his medications.  He reports that he can contract for safety and will come back if things worsen.  He is not distressed.  I will write for 1 week of meds.  Pt is to follow up with [facility].  He is in agreement with this plan.    Final Clinical Impressions(s) / ED Diagnoses   Final diagnoses:  Auditory hallucination    ED Discharge Orders        Ordered    benztropine (COGENTIN) 0.5 MG tablet  Daily at bedtime     12/29/17 2325     FLUoxetine (PROZAC) 20 MG capsule  Daily     12/29/17 2325    risperiDONE (RISPERDAL) 2 MG tablet  2 times daily     12/29/17 2325    traZODone (DESYREL) 50 MG tablet  At bedtime PRN     12/29/17 2325       Rayden Scheper, Boyd Kerbs 12/29/17 2327    Shaune Pollack, MD 12/30/17 1137

## 2017-12-29 NOTE — ED Triage Notes (Signed)
Pt stated "the voices are telling me they're going to kill me.  I lost my Rx's and need to see the doctor to get some more."

## 2017-12-29 NOTE — ED Notes (Signed)
Bed: WA31 Expected date:  Expected time:  Means of arrival:  Comments: EVS 

## 2017-12-29 NOTE — BH Assessment (Addendum)
Assessment Note  Cole HawkingChristopher Adam Barrett is an 32 y.o. male, who [facility] voluntary and [facility]. Pt has fourteen visits to the ED for similar presentations. Clinician asked the pt, "what brought you to the hospital?" Pt reported, [facility] a couple days ago and started hearing voices out loud. Pt reported, "the voice said he was gonna kill me." Pt reported, "I needed to see a doctor." Pt reported, he was not given medication [facility]. Pt then retracted his statement and reported, the medications he was given does not work. Pt denies, SI, HI, AVH, self-injurious behaviors and access to weapons.   Pt denies, abuse and substance use. Pt's UDS is pending. Pt denies, being linked to OPT resources (medication management and/or counseling.) Pt reported, previous inpatient admissions.   Pt presents alert with body order, poor hygiene in scrubs with logical/coherent speech. Pt's eye contact was fair. Pt's mood was anxious. Pt's affect was congruent with mood. Pt's judgement was unimpaired. Pt was oriented x4. Pt's concentration was normal. Pt's insight was poor. Pt's impulse control was good. Pt reported, [facility] he could contract for safety. Pt reported, if inpatient treatment is recommended he would sign-in voluntarily.   Diagnosis: F20.9 Schizophrenia.  Past Medical History:  Past Medical History:  Diagnosis Date  . Homelessness   . Hypercholesterolemia   . Mental disorder   . Paranoid behavior (HCC)   . PE (pulmonary embolism)   . Schizophrenia (HCC)     History reviewed. No pertinent surgical history.  Family History:  Family History  Problem Relation Age of Onset  . Hypertension Mother     Social History:  reports that he has been smoking cigarettes.  He has been smoking about 0.50 packs per day. he has never used smokeless tobacco. He reports that he drinks alcohol. He reports that he uses drugs. Drug:  Marijuana.  Additional Social History:  Alcohol / Drug Use Pain Medications: See MAR  Prescriptions: See MAR Over the Counter: See MAR History of alcohol / drug use?: (Pt denies. UDS is pending. )  CIWA: CIWA-Ar BP: (!) 132/97 Pulse Rate: 94 COWS:    Allergies:  Allergies  Allergen Reactions  . Haldol [Haloperidol] Shortness Of Breath    Home Medications:  (Not in a hospital admission)  OB/GYN Status:  No LMP for male patient.  General Assessment Data Location of Assessment: [facility] ED TTS Assessment: In system Is this a Tele or Face-to-Face Assessment?: Face-to-Face Is this an Initial Assessment or a Re-assessment for this encounter?: Initial Assessment Marital status: Single Living Arrangements: Alone Can pt return to current living arrangement?: Yes Admission Status: Voluntary Is patient capable of signing voluntary admission?: Yes Referral Source: Self/Family/Friend Insurance type: Self-pay.      Crisis Care Plan Living Arrangements: Alone Legal Guardian: Other:(Self. ) Name of Psychiatrist: NA Name of Therapist: NA  Education Status Is patient currently in school?: No Current Grade: NA Highest grade of school patient has completed: Pt reported, freshmen in collage.  Name of school: NA Contact person: NA  Risk to self with the past 6 months Suicidal Ideation: No(Pt denies. ) Has patient been a risk to self within the past 6 months prior to admission? : No Suicidal Intent: No Has patient had any suicidal intent within the past 6 months prior to admission? : No Is patient at risk for suicide?: No Suicidal Plan?: No Has patient had any suicidal plan within the past  6 months prior to admission? : No Access to Means: No What has been your use of drugs/alcohol within the last 12 months?: Pt's UDS is pending.  Previous Attempts/Gestures: No How many times?: 0 Other Self Harm Risks: Pt denies.  Triggers for Past Attempts: None known Intentional Self Injurious  Behavior: None(Pt denies. ) Family Suicide History: No Recent stressful life event(s): Other (Comment)(Hearing voices. ) Persecutory voices/beliefs?: No Depression: No(Pt denies. ) Depression Symptoms: (Pt denies. ) Substance abuse history and/or treatment for substance abuse?: Yes Suicide prevention information given to non-admitted patients: Not applicable  Risk to Others within the past 6 months Homicidal Ideation: No(Pt denies. ) Does patient have any lifetime risk of violence toward others beyond the six months prior to admission? : No Thoughts of Harm to Others: No Current Homicidal Intent: No Current Homicidal Plan: No Access to Homicidal Means: No Identified Victim: NA History of harm to others?: No Assessment of Violence: None Noted Violent Behavior Description: NA Does patient have access to weapons?: No(Pt denies. ) Criminal Charges Pending?: No Describe Pending Criminal Charges: NA Does patient have a court date: Yes Court Date: 01/01/18(Pt reported, for hanging around somewhere too long. ) Is patient on probation?: No  Psychosis Hallucinations: Auditory Delusions: None noted  Mental Status Report Appearance/Hygiene: Body odor, Poor hygiene, In scrubs Eye Contact: Fair Motor Activity: Unremarkable Speech: Logical/coherent Level of Consciousness: Alert Mood: Anxious Affect: Other (Comment)(congruent to mood. ) Anxiety Level: Minimal Thought Processes: Relevant, Coherent Judgement: Unimpaired Orientation: Person, Place, Time, Situation Obsessive Compulsive Thoughts/Behaviors: None  Cognitive Functioning Concentration: Normal Memory: Recent Intact IQ: Average Level of Function: NA Insight: Poor Impulse Control: Fair Appetite: Good Sleep: Unable to Assess Vegetative Symptoms: None  ADLScreening [facility] Assessment Services) Patient's cognitive ability adequate to safely complete daily activities?: Yes Patient able to express need for assistance with  ADLs?: Yes Independently performs ADLs?: Yes (appropriate for developmental age)  Prior Inpatient Therapy Prior Inpatient Therapy: Yes Prior Therapy Dates: 2018, 2018 Prior Therapy Facilty/Provider(s): Per chart, [facility], [place], and [place]. Reason for Treatment: Per chart, schizophrenia.   Prior Outpatient Therapy Prior Outpatient Therapy: No Prior Therapy Dates: NA Prior Therapy Facilty/Provider(s): NA Reason for Treatment: NA Does patient have an ACCT team?: No Does patient have Intensive In-House Services?  : No Does patient have [facility] services? : No Does patient have [facility] services?: No  ADL Screening (condition at time of admission) Patient's cognitive ability adequate to safely complete daily activities?: Yes Is the patient deaf or have difficulty hearing?: No Does the patient have difficulty seeing, even when wearing glasses/contacts?: No Does the patient have difficulty concentrating, remembering, or making decisions?: No Patient able to express need for assistance with ADLs?: Yes Does the patient have difficulty dressing or bathing?: No Independently performs ADLs?: Yes (appropriate for developmental age) Does the patient have difficulty walking or climbing stairs?: No Weakness of Legs: None Weakness of Arms/Hands: None  Home Assistive Devices/Equipment Home Assistive Devices/Equipment: None    Abuse/Neglect Assessment (Assessment to be complete while patient is alone) Physical Abuse: Denies(Pt denies. ) Verbal Abuse: Denies(Pt denies. ) Sexual Abuse: Denies(Pt denies. ) Exploitation of patient/patient's resources: Denies(Pt denies. ) Self-Neglect: Denies(Pt denies. )     Advance Directives (For Healthcare) Does Patient Have a Medical Advance Directive?: No Would patient like information on creating a medical advance directive?: No - Patient declined    Additional Information 1:1 In Past 12 Months?: No CIRT Risk: No Elopement Risk: No Does patient  have medical  clearance?: Yes     Disposition: Dahlia Client, PA recommends discharge with updated prescriptions. Pt declined additional OPT resources. Pt signed a Energy manager. Disposition discussed with Consuella Lose, RN.   Disposition Initial Assessment Completed for this Encounter: Yes Disposition of Patient: Other dispositions(Discharged pt refused OPT resources. ) Other disposition(s): Other (Comment)(Discharged pt refused OPT resources. )  On Site Evaluation by:   Reviewed with Physician:    Redmond Pulling, MS, [facility], [facility] Triage Specialist [phone]   Redmond Pulling 12/29/2017 11:40 PM

## 2017-12-30 NOTE — ED Notes (Signed)
Pt given back his 4 bags of belongings. Dressed himself and ambulated out of the ED in no distress.

## 2018-01-13 ENCOUNTER — Encounter (HOSPITAL_COMMUNITY): Payer: Self-pay | Admitting: Emergency Medicine

## 2018-01-13 ENCOUNTER — Emergency Department (HOSPITAL_COMMUNITY)
Admission: EM | Admit: 2018-01-13 | Discharge: 2018-01-13 | Disposition: A | Discharge status: Home / Self Care | Payer: Self-pay | Attending: Emergency Medicine | Admitting: Emergency Medicine

## 2018-01-13 ENCOUNTER — Other Ambulatory Visit: Payer: Self-pay

## 2018-01-13 DIAGNOSIS — F209 Schizophrenia, unspecified: Secondary | ICD-10-CM | POA: Insufficient documentation

## 2018-01-13 DIAGNOSIS — Z79899 Other long term (current) drug therapy: Secondary | ICD-10-CM | POA: Insufficient documentation

## 2018-01-13 DIAGNOSIS — R443 Hallucinations, unspecified: Secondary | ICD-10-CM

## 2018-01-13 DIAGNOSIS — Z590 Homelessness: Secondary | ICD-10-CM | POA: Insufficient documentation

## 2018-01-13 DIAGNOSIS — F17210 Nicotine dependence, cigarettes, uncomplicated: Secondary | ICD-10-CM | POA: Insufficient documentation

## 2018-01-13 DIAGNOSIS — Z86711 Personal history of pulmonary embolism: Secondary | ICD-10-CM | POA: Insufficient documentation

## 2018-01-13 NOTE — ED Triage Notes (Signed)
Pt states he is hearing voices  Denies SI/HI  Pt states he is not currently taking any medications

## 2018-01-13 NOTE — ED Notes (Signed)
Pt has been wanded by security. 

## 2018-01-13 NOTE — BH Assessment (Addendum)
Assessment Note  Cole Barrett is an 32 y.o. male, who presents voluntary and unaccompanied to [facility]. Pt was a poor historian during the assessment and replied to most questions, "I don't know." Per chart, pt was discharged from [facility] on 12/28/2017 with a bus pass, samples of medications and new prescriptions. Per chart, pt came to [facility] on 12/29/2017 expressing that he ran out of his medications then said the medications did not work. Per chart, pt was discharged from [facility], given another prescription and additional OPT resources. Pt declined to take the resources. Clinician asked the pt, "what brought you to the hospital?" Pt reported, "hearing voices saying they are going to kill me." Pt reported, he ran out of medications a week ago. Pt was unable to tell clinician the name of his medications and the name of his psychiatrist who prescribes his medications. Pt reported he feels his medications are not working. Pt denies, SI, HI, AVH, self-injurious behaviors and access to weapons.   Pt denies, abuse and substance use. Pt's UDS is pending. Pt denies, being linked to OPT resources (medication management and/or counseling.) Pt reported, previous inpatient admissions.   Pt presents quiet/awake with body order, poor hygiene in scrubs with logical/coherent speech. Pt's eye contact was poor. Pt's mood/affect was irritable. Pt's thought process was circumstantial. Pt's judgement was unimpaired. Pt was oriented x4. Pt's concentration was normal. Pt's insight was poor. Pt's impulse control was good. Pt reported, if discharged from [facility] he could contract for safety. Pt reported, if inpatient treatment is recommended he would sign-in voluntarily.   Diagnosis: F20.9 Schizophrenia.  Past Medical History:  Past Medical History:  Diagnosis Date  . Homelessness   . Hypercholesterolemia   . Mental disorder   . Paranoid behavior (HCC)   . PE (pulmonary embolism)   . Schizophrenia (HCC)     History  reviewed. No pertinent surgical history.  Family History:  Family History  Problem Relation Age of Onset  . Hypertension Mother     Social History:  reports that he has been smoking cigarettes.  He has been smoking about 0.50 packs per day. he has never used smokeless tobacco. He reports that he drinks alcohol. He reports that he uses drugs. Drug: Marijuana.  Additional Social History:  Alcohol / Drug Use Pain Medications: See MAR  Prescriptions: See MAR Over the Counter: See MAR History of alcohol / drug use?: Yes(UDS is pending. ) Substance #1 Name of Substance 1: Cigarettes. 1 - Age of First Use: UTA 1 - Amount (size/oz): Pt reported, smoking cigarettes.  1 - Frequency: UTA 1 - Duration: UTA 1 - Last Use / Amount: UTA  CIWA: CIWA-Ar BP: 128/76 Pulse Rate: 77 COWS:    Allergies:  Allergies  Allergen Reactions  . Haldol [Haloperidol] Shortness Of Breath    Home Medications:  (Not in a hospital admission)  OB/GYN Status:  No LMP for male patient.  General Assessment Data Location of Assessment: [facility] ED TTS Assessment: In system Is this a Tele or Face-to-Face Assessment?: Face-to-Face Is this an Initial Assessment or a Re-assessment for this encounter?: Initial Assessment Marital status: Single Living Arrangements: Other (Comment)(Homeless.) Can pt return to current living arrangement?: Yes Admission Status: Voluntary Is patient capable of signing voluntary admission?: Yes Referral Source: Self/Family/Friend Insurance type: Self-pay.     Crisis Care Plan Living Arrangements: Other (Comment)(Homeless.) Legal Guardian: Other:(Self.) Name of Psychiatrist: NA Name of Therapist: NA  Education Status Is patient currently in school?: No Current Grade: NA  Highest grade of school patient has completed: Pt reported, collage.  Name of school: NA Contact person: NA  Risk to self with the past 6 months Suicidal Ideation: No(Pt denies. ) Has patient been a risk to  self within the past 6 months prior to admission? : No Suicidal Intent: No Has patient had any suicidal intent within the past 6 months prior to admission? : No Is patient at risk for suicide?: No Suicidal Plan?: No Has patient had any suicidal plan within the past 6 months prior to admission? : No Access to Means: No What has been your use of drugs/alcohol within the last 12 months?: Cigarettes. Pt's UDS is pending.  Previous Attempts/Gestures: No How many times?: 0 Other Self Harm Risks: Pt denies.  Triggers for Past Attempts: None known Intentional Self Injurious Behavior: None(Pt denies. ) Family Suicide History: No Recent stressful life event(s): Other (Comment)(Hearing voices, not having medications. ) Persecutory voices/beliefs?: No Depression: No(Pt denies. ) Depression Symptoms: (Pt denies. ) Substance abuse history and/or treatment for substance abuse?: Yes Suicide prevention information given to non-admitted patients: Not applicable  Risk to Others within the past 6 months Homicidal Ideation: No(Pt denies.) Does patient have any lifetime risk of violence toward others beyond the six months prior to admission? : No(Pt denies.) Thoughts of Harm to Others: No Current Homicidal Intent: No Current Homicidal Plan: No Access to Homicidal Means: No Identified Victim: NA History of harm to others?: No Assessment of Violence: None Noted Violent Behavior Description: NA Does patient have access to weapons?: No(Pt denies. ) Criminal Charges Pending?: No Describe Pending Criminal Charges: NA Does patient have a court date: No(Pt denies.) Court Date: (Pt denies. ) Is patient on probation?: No  Psychosis Hallucinations: Auditory Delusions: None noted  Mental Status Report Appearance/Hygiene: Body odor, Poor hygiene, In scrubs Eye Contact: Poor Motor Activity: Unremarkable Speech: Logical/coherent Level of Consciousness: Quiet/awake Mood: Irritable Affect:  Irritable Anxiety Level: None Thought Processes: Circumstantial Judgement: Unimpaired Orientation: Person, Place, Time, Situation Obsessive Compulsive Thoughts/Behaviors: None  Cognitive Functioning Concentration: Normal Memory: Recent Intact IQ: Average Level of Function: NA Insight: Poor Impulse Control: Fair Appetite: Good Sleep: Unable to Assess Vegetative Symptoms: None  ADLScreening [facility]) Patient's cognitive ability adequate to safely complete daily activities?: Yes Patient able to express need for assistance with ADLs?: Yes Independently performs ADLs?: Yes (appropriate for developmental age)  Prior Inpatient Therapy Prior Inpatient Therapy: Yes Prior Therapy Dates: Per chart, 2018. Prior Therapy Facilty/Provider(s): Per chart, [facility], [facility], and [facility]. Reason for Treatment: Per chart, schizophrenia.   Prior Outpatient Therapy Prior Outpatient Therapy: No Prior Therapy Dates: NA Prior Therapy Facilty/Provider(s): NA Reason for Treatment: NA Does patient have an ACCT team?: No Does patient have Intensive In-House Services?  : No Does patient have [facility] services? : No Does patient have P4CC services?: No  ADL Screening (condition at time of admission) Patient's cognitive ability adequate to safely complete daily activities?: Yes Is the patient deaf or have difficulty hearing?: No Does the patient have difficulty seeing, even when wearing glasses/contacts?: No Does the patient have difficulty concentrating, remembering, or making decisions?: No Patient able to express need for assistance with ADLs?: Yes Does the patient have difficulty dressing or bathing?: No Independently performs ADLs?: Yes (appropriate for developmental age) Does the patient have difficulty walking or climbing stairs?: No Weakness of Legs: None Weakness of Arms/Hands: None  Home Assistive Devices/Equipment Home Assistive Devices/Equipment: None     Abuse/Neglect Assessment (Assessment to be  complete while patient is alone) Physical Abuse: Denies(Pt denies. ) Verbal Abuse: Denies(Pt denies. ) Sexual Abuse: Denies(Pt denies. ) Exploitation of patient/patient's resources: Denies(Pt denies. ) Self-Neglect: Denies(Pt denies.)     Advance Directives (For Healthcare) Does Patient Have a Medical Advance Directive?: No Would patient like information on creating a medical advance directive?: No - Patient declined    Additional Information 1:1 In Past 12 Months?: No CIRT Risk: No Elopement Risk: No Does patient have medical clearance?: Yes     Disposition: Nira ConnJason Berry, recommends discharge. Per Misty StanleyLisa, [place] she will include OPT resources in the pt's discharge summary. Disposition discussed with Elissa LovettLisa, Charge Nurse.    Disposition Initial Assessment Completed for this Encounter: Yes Disposition of Patient: Discharge with Outpatient Resources Other disposition(s): (Discharge with OPT rescources. )  On Site Evaluation by:  Holly Bodilyreylese D. Tome Wilson, MS, LPC, CRC. Reviewed with Physician:  Misty StanleyLisa, [place] and Nira ConnJason Berry, NP.  Redmond Pullingreylese D Aide Wojnar 01/13/2018 4:26 AM   Redmond Pullingreylese D Heran Campau, MS, [facility], [facility] Triage Specialist [phone]

## 2018-01-13 NOTE — ED Provider Notes (Signed)
[facility]-EMERGENCY DEPT Provider Note   CSN: [id number] Arrival date & time: 01/13/18  0024     History   Chief Complaint Chief Complaint  Patient presents with  . Hallucinations    HPI Cole Barrett is a 32 y.o. male.  The history is provided by the patient and medical records.     32 y.o. M with hx of HLP, homelessness, paranoid behavior, hx of PE, schizophrenia, presenting to the ED for hallucinations.  Patient seen here frequently for same.  Patient states he has been doing well for the past 2 weeks ago, voices seemed to quiet down somewhat but got louder again last evening.  States now they are hard to "tune out".  He denies any suicidal homicidal ideation.  No visual hallucinations.  Denies any alcohol or illicit drug use.  Patient states he does not currently have a psychiatrist.  He states he gets refills of his medications when he is seen here in the ED.  He has not had any medications in about 1 week.  Past Medical History:  Diagnosis Date  . Homelessness   . Hypercholesterolemia   . Mental disorder   . Paranoid behavior (HCC)   . PE (pulmonary embolism)   . Schizophrenia [facility])     Patient Active Problem List   Diagnosis Date Noted  . Schizophrenia (HCC) 12/24/2017  . 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 Schizophrenia, paranoid (HCC)   . 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 MDD (major depressive disorder) 09/22/2017  . Alcohol abuse   . Auditory hallucinations   . Alcohol abuse with alcohol-induced mood disorder (HCC) 09/08/2016  . Adjustment disorder with depressed mood 09/07/2016  . Homelessness 09/03/2016  . Person feigning illness 09/03/2016  . 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 Cannabis use disorder, mild, abuse 08/29/2016  . Pulmonary emboli (HCC) 07/13/2016  . 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 Acute  left flank pain 01/02/2014  . Pulmonary embolism, bilateral (HCC) 01/02/2014    History reviewed. No pertinent surgical history.     Home Medications    Prior to Admission medications   Medication Sig Start Date End Date Taking? Authorizing Provider  benztropine (COGENTIN) 0.5 MG tablet Take 1 tablet (0.5 mg total) by mouth at bedtime. For prevention of drug induced tremors Patient not taking: Reported on 01/13/2018 12/29/17   Muthersbaugh, Dahlia Client, PA-C  FLUoxetine (PROZAC) 20 MG capsule Take 1 capsule (20 mg total) by mouth daily. For depression Patient not taking: Reported on 01/13/2018 12/29/17   Muthersbaugh, Dahlia Client, PA-C  hydrOXYzine (ATARAX/VISTARIL) 25 MG tablet Take 1 tablet (25 mg total) by mouth 3 (three) times daily as needed for anxiety. Patient not taking: Reported on 01/13/2018 12/29/17   Muthersbaugh, Dahlia Client, PA-C  risperiDONE (RISPERDAL) 2 MG tablet Take 1 tablet (2 mg total) by mouth 2 (two) times daily. For mood control Patient not taking: Reported on 01/13/2018 12/29/17   Muthersbaugh, Dahlia Client, PA-C  traZODone (DESYREL) 50 MG tablet Take 1 tablet (50 mg total) by mouth at bedtime as needed for sleep. Patient not taking: Reported on 01/13/2018 12/29/17   Muthersbaugh, Dahlia Client, PA-C    Family History Family History  Problem Relation Age of Onset  . Hypertension Mother     Social History Social History   Tobacco Use  . Smoking status: Current Every Day Smoker    Packs/day: 0.50    Types: Cigarettes  . Smokeless tobacco: Never  Used  Substance Use Topics  . Alcohol use: Yes    Comment: socially  . Drug use: Yes    Types: Marijuana    Comment: Once every 2-3 months, Last used: Last month     Allergies   Haldol [haloperidol]   Review of Systems Review of Systems  Psychiatric/Behavioral: Positive for hallucinations.  All other systems reviewed and are negative.    Physical Exam Updated Vital Signs BP 128/76 (BP Location: Left Arm)   Pulse 77   Temp 98.7 F  (37.1 C) (Oral)   Resp 18   SpO2 99%   Physical Exam  Constitutional: He is oriented to person, place, and time. He appears well-developed and well-nourished.  HENT:  Head: Normocephalic and atraumatic.  Mouth/Throat: Oropharynx is clear and moist.  Eyes: Conjunctivae and EOM are normal. Pupils are equal, round, and reactive to light.  Neck: Normal range of motion.  Cardiovascular: Normal rate, regular rhythm and normal heart sounds.  Pulmonary/Chest: Effort normal and breath sounds normal. No stridor. No respiratory distress.  Abdominal: Soft. Bowel sounds are normal. There is no tenderness. There is no rebound.  Musculoskeletal: Normal range of motion.  Neurological: He is alert and oriented to person, place, and time.  Skin: Skin is warm and dry.  Psychiatric: He has a normal mood and affect. He is actively hallucinating. He expresses no homicidal and no suicidal ideation. He expresses no suicidal plans and no homicidal plans.  Nursing note and vitals reviewed.    ED Treatments / Results  Labs (all labs ordered are listed, but only abnormal results are displayed) Labs Reviewed - No data to display  EKG  EKG Interpretation None       Radiology No results found.  Procedures Procedures (including critical care time)  Medications Ordered in ED Medications - No data to display   Initial Impression / Assessment and Plan / ED Course  I have reviewed the triage vital signs and the nursing notes.  Pertinent labs & imaging results that were available during my care of the patient were reviewed by me and considered in my medical decision making (see chart for details).  32 y.o. M here with auditory hallucinations.  He has longstanding history of same.  States he has been off of his medications for about 1 week.  Denies SI/HI or visual hallucinations.  Patient has longstanding history of hallucinations, well known to the ED for similar complaints.  Will hold off on blood work  for now.  Will get TTS consult for recommendations.  TTS has evaluated, recommendation is for d/c with OP resources.  I agree with this assessment.  Patient given resources for local clinics as well as local shelters.  He understands to return here for any new/acute changes.  Final Clinical Impressions(s) / ED Diagnoses   Final diagnoses:  Hallucinations    ED Discharge Orders    None       Garlon HatchetSanders, Delia Slatten M, PA-C 01/13/18 0448    Ward, Layla MawKristen N, DO 01/13/18 [id number]

## 2018-01-13 NOTE — ED Notes (Signed)
Pt placed in scrubs and wanded by security  

## 2018-01-13 NOTE — Discharge Instructions (Signed)
Please follow-up with one of the behavioral health centers here locally. I have also attached list of local shelters if you need help with housing. You can return here for any new/acute changes.

## 2018-01-14 ENCOUNTER — Emergency Department (HOSPITAL_COMMUNITY)
Admission: EM | Admit: 2018-01-14 | Discharge: 2018-01-15 | Disposition: A | Discharge status: Home / Self Care | Payer: Self-pay | Attending: Emergency Medicine | Admitting: Emergency Medicine

## 2018-01-14 ENCOUNTER — Other Ambulatory Visit: Payer: Self-pay

## 2018-01-14 ENCOUNTER — Encounter (HOSPITAL_COMMUNITY): Payer: Self-pay

## 2018-01-14 DIAGNOSIS — F200 Paranoid schizophrenia: Secondary | ICD-10-CM | POA: Diagnosis present

## 2018-01-14 DIAGNOSIS — F17210 Nicotine dependence, cigarettes, uncomplicated: Secondary | ICD-10-CM | POA: Insufficient documentation

## 2018-01-14 DIAGNOSIS — F209 Schizophrenia, unspecified: Secondary | ICD-10-CM | POA: Insufficient documentation

## 2018-01-14 DIAGNOSIS — Z590 Homelessness: Secondary | ICD-10-CM | POA: Insufficient documentation

## 2018-01-14 DIAGNOSIS — F1210 Cannabis abuse, uncomplicated: Secondary | ICD-10-CM | POA: Insufficient documentation

## 2018-01-14 LAB — RAPID URINE DRUG SCREEN, HOSP PERFORMED
Amphetamines: NOT DETECTED
BENZODIAZEPINES: NOT DETECTED
Barbiturates: NOT DETECTED
Cocaine: NOT DETECTED
Opiates: NOT DETECTED
Tetrahydrocannabinol: NOT DETECTED

## 2018-01-14 LAB — COMPREHENSIVE METABOLIC PANEL
ALBUMIN: 4 g/dL (ref 3.5–5.0)
ALK PHOS: 46 U/L (ref 38–126)
ALT: 32 U/L (ref 17–63)
AST: 32 U/L (ref 15–41)
Anion gap: 11 (ref 5–15)
BUN: 8 mg/dL (ref 6–20)
CALCIUM: 8.9 mg/dL (ref 8.9–10.3)
CHLORIDE: 106 mmol/L (ref 101–111)
CO2: 23 mmol/L (ref 22–32)
CREATININE: 1.14 mg/dL (ref 0.61–1.24)
GFR calc non Af Amer: >60 mL/min (ref >60)
GLUCOSE: 134 mg/dL — AB (ref 65–99)
Potassium: 3.6 mmol/L (ref 3.5–5.1)
SODIUM: 140 mmol/L (ref 135–145)
Total Bilirubin: 0.6 mg/dL (ref 0.3–1.2)
Total Protein: 6.9 g/dL (ref 6.5–8.1)

## 2018-01-14 LAB — CBC
HEMATOCRIT: 39.5 % (ref 39.0–52.0)
Hemoglobin: 13 g/dL (ref 13.0–17.0)
MCH: 28.3 pg (ref 26.0–34.0)
MCHC: 32.9 g/dL (ref 30.0–36.0)
MCV: 86.1 fL (ref 78.0–100.0)
PLATELETS: 289 10*3/uL (ref 150–400)
RBC: 4.59 MIL/uL (ref 4.22–5.81)
RDW: 13.1 % (ref 11.5–15.5)
WBC: 3.8 10*3/uL — AB (ref 4.0–10.5)

## 2018-01-14 LAB — ETHANOL: Alcohol, Ethyl (B): <10 mg/dL (ref <10)

## 2018-01-14 MED ORDER — RISPERIDONE 1 MG PO TABS
2.0000 mg | ORAL_TABLET | Freq: Two times a day (BID) | ORAL | Status: DC
  Administered 2018-01-14 – 2018-01-15 (×2): 2 mg via ORAL
  Filled 2018-01-14: qty 1
  Filled 2018-01-14: qty 2

## 2018-01-14 MED ORDER — FLUOXETINE HCL 20 MG PO CAPS
20.0000 mg | ORAL_CAPSULE | Freq: Every day | ORAL | Status: DC
Start: 2018-01-14 — End: 2018-01-15
  Administered 2018-01-14 – 2018-01-15 (×2): 20 mg via ORAL
  Filled 2018-01-14 (×2): qty 1

## 2018-01-14 MED ORDER — HYDROXYZINE HCL 25 MG PO TABS: 25.0000 mg | ORAL_TABLET | Freq: Three times a day (TID) | ORAL | Status: DC | PRN

## 2018-01-14 MED ORDER — BENZTROPINE MESYLATE 1 MG PO TABS
0.5000 mg | ORAL_TABLET | Freq: Every day | ORAL | Status: DC
  Administered 2018-01-14: 0.5 mg via ORAL
  Filled 2018-01-14: qty 1

## 2018-01-14 MED ORDER — TRAZODONE HCL 50 MG PO TABS: 50.0000 mg | ORAL_TABLET | Freq: Every evening | ORAL | Status: DC | PRN

## 2018-01-14 NOTE — ED Notes (Signed)
Pt requesting TV be cut on and channel changed to 24. This is done by this tech

## 2018-01-14 NOTE — ED Notes (Signed)
Pt requested more sprite to go with his dinner. Pt given the same.

## 2018-01-14 NOTE — ED Notes (Signed)
Pt arrived to [facility]  - wearing personal clothing. Pt has requested to shower prior to putting on paper scrubs and socks. Pt being allowed to use his own soap d/t foul body odor. Pt is homeless. Aware of tx plan and voiced agreement - to be reassessed in am.

## 2018-01-14 NOTE — ED Provider Notes (Signed)
[name] Provider Note   CSN: [id number] Arrival date & time: 01/14/18  1146     History   Chief Complaint No chief complaint on file.   HPI Cole Barrett is a [age] y.o. male.  HPI Patient presents for hallucinations.  States his been out of his medications for a couple weeks.  States the voices have gotten louder today.  States last time he got seen for this was over a month ago.  He was however at was a long hospital until earlier today.  States he does not know what medicines he is supposed to be on.  States he is taking no medicines.  Denies drug use.  Denies suicidal or homicidal thoughts Past Medical History:  Diagnosis Date  . Homelessness   . Hypercholesterolemia   . Mental disorder   . Paranoid behavior (HCC)   . PE (pulmonary embolism)   . Schizophrenia [facility](HCC)     Patient Active Problem List   Diagnosis Date Noted  . Schizophrenia (HCC) 12/24/2017  . 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 Schizophrenia, paranoid (HCC)   . 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 MDD (major depressive disorder) 09/22/2017  . Alcohol abuse   . Auditory hallucinations   . Alcohol abuse with alcohol-induced mood disorder (HCC) 09/08/2016  . Adjustment disorder with depressed mood 09/07/2016  . Homelessness 09/03/2016  . Person feigning illness 09/03/2016  . 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 Cannabis use disorder, mild, abuse 08/29/2016  . Pulmonary emboli (HCC) 07/13/2016  . 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 Acute left flank pain 01/02/2014  . Pulmonary embolism, bilateral (HCC) 01/02/2014    History reviewed. No pertinent surgical history.     Home Medications    Prior to Admission medications   Medication Sig Start Date End Date Taking? Authorizing Provider  benztropine (COGENTIN) 0.5 MG tablet Take 1 tablet  (0.5 mg total) by mouth at bedtime. For prevention of drug induced tremors Patient not taking: Reported on 01/13/2018 12/29/17   Muthersbaugh, Dahlia ClientHannah, PA-C  FLUoxetine (PROZAC) 20 MG capsule Take 1 capsule (20 mg total) by mouth daily. For depression Patient not taking: Reported on 01/13/2018 12/29/17   Muthersbaugh, Dahlia ClientHannah, PA-C  hydrOXYzine (ATARAX/VISTARIL) 25 MG tablet Take 1 tablet (25 mg total) by mouth 3 (three) times daily as needed for anxiety. Patient not taking: Reported on 01/13/2018 12/29/17   Muthersbaugh, Dahlia ClientHannah, PA-C  risperiDONE (RISPERDAL) 2 MG tablet Take 1 tablet (2 mg total) by mouth 2 (two) times daily. For mood control Patient not taking: Reported on 01/13/2018 12/29/17   Muthersbaugh, Dahlia ClientHannah, PA-C  traZODone (DESYREL) 50 MG tablet Take 1 tablet (50 mg total) by mouth at bedtime as needed for sleep. Patient not taking: Reported on 01/13/2018 12/29/17   Muthersbaugh, Dahlia ClientHannah, PA-C    Family History Family History  Problem Relation Age of Onset  . Hypertension Mother     Social History Social History   Tobacco Use  . Smoking status: Current Every Day Smoker    Packs/day: 0.50    Types: Cigarettes  . Smokeless tobacco: Never Used  Substance Use Topics  . Alcohol use: Yes    Comment: socially  . Drug use: Yes    Types: Marijuana    Comment: Once every 2-3 months, Last used: Last month     Allergies   Haldol [haloperidol]   Review of Systems Review of Systems  Constitutional:  Negative for chills.  HENT: Negative for congestion.   Respiratory: Negative for shortness of breath.   Cardiovascular: Negative for chest pain.  Gastrointestinal: Negative for abdominal pain.  Genitourinary: Negative for flank pain.  Neurological: Negative for numbness.  Hematological: Negative for adenopathy.  Psychiatric/Behavioral: Positive for hallucinations. Negative for confusion.     Physical Exam Updated Vital Signs BP (!) 162/78 (BP Location: Right Arm)   Pulse 97   Temp 98.2  F (36.8 C) (Oral)   Resp 17   Ht 5\' 10"  (1.778 m)   Wt 99.8 kg (220 lb)   SpO2 99%   BMI 31.57 kg/m   Physical Exam  Constitutional: He appears well-developed.  HENT:  Head: Atraumatic.  Neck: Neck supple.  Cardiovascular: Normal rate.  Pulmonary/Chest: Effort normal.  Abdominal: There is no tenderness.  Musculoskeletal: He exhibits no tenderness.  Neurological: He is alert.  Skin: Skin is warm. Capillary refill takes less than 2 seconds.     ED Treatments / Results  Labs (all labs ordered are listed, but only abnormal results are displayed) Labs Reviewed  COMPREHENSIVE METABOLIC PANEL - Abnormal; Notable for the following components:      Result Value   Glucose, Bld 134 (*)    All other components within normal limits  CBC - Abnormal; Notable for the following components:   WBC 3.8 (*)    All other components within normal limits  ETHANOL  RAPID URINE DRUG SCREEN, HOSP PERFORMED    EKG  EKG Interpretation None       Radiology No results found.  Procedures Procedures (including critical care time)  Medications Ordered in ED Medications  benztropine (COGENTIN) tablet 0.5 mg (not administered)  FLUoxetine (PROZAC) capsule 20 mg (not administered)  hydrOXYzine (ATARAX/VISTARIL) tablet 25 mg (not administered)  risperiDONE (RISPERDAL) tablet 2 mg (not administered)  traZODone (DESYREL) tablet 50 mg (not administered)     Initial Impression / Assessment and Plan / ED Course  I have reviewed the triage vital signs and the nursing notes.  Pertinent labs & imaging results that were available during my care of the patient were reviewed by me and considered in my medical decision making (see chart for details).     Patient presents with hallucinations.  Frequent visits for same.  States he does not know what medicine he is on  and has not been taking them.  Denies being seen anywhere for this however was just discharged this morning from [facility] hospital  ER for the same.  Medically cleared.  Reviewed labs from earlier.  To be seen by TTS. Final Clinical Impressions(s) / ED Diagnoses   Final diagnoses:  Schizophrenia, unspecified type [facility])    ED Discharge Orders    None       Benjiman Core, MD 01/14/18 1614

## 2018-01-14 NOTE — ED Triage Notes (Signed)
Patient states he has been out of behavioral meds x 2 weeks. States that he is hearing voices. Alert and oriented, denies suicidal and homicidal ideation. NAD

## 2018-01-14 NOTE — ED Notes (Signed)
Pt remains in shower. Staff have assessed if pt ok - voices OK.

## 2018-01-14 NOTE — ED Notes (Addendum)
Pt changed into paper scrubs. Pt asking for his underwear - advised pt no personal clothing to be worn. Wanded by security.

## 2018-01-14 NOTE — BH Assessment (Signed)
Tele Assessment Note   Patient Name: Cole Barrett MRN: [id number] Referring Physician: Dr. Benjiman CoreNathan Pickering Location of Patient: [facility] Location of Provider: Behavioral Health TTS Department  Cole HawkingChristopher Adam Barrett is a 32 y.o. male who [facility] a [facility] with complaint of significant auditory hallucination and non-compliance with medication.  Pt has been assessed by TTS on many occasions, most recently on 01/13/18.      Pt has a history of schizophrenia.  Pt [facility] and discharged on 12/28/17 with a bus pass, samples of medication, and prescriptions.  Per report, Pt [facility] with complaint that he ran out of his medication and that his medications do not work.  Pt was discharged after being given another prescription and with additional OPT resources.  [facility]18 with complaint of significant auditory hallucination and stating that he had been out of medication for weeks.  Pt was discharged with outpatient resources.    [facility] with similar complaint -- namely, that he has auditory hallucinations and is out of medication.  Pt stated that he has had auditory hallucination for years, but that his hallucinations have gotten worse over the last two days.  He stated that he hears voices that threaten to harm him.  Pt asked for help in quieting the voices -- ''I want to be here overnight in case they get worse."  Pt denied suicidal ideation, homicidal ideation, visual hallucination, and self-injurious behavior.  Pt endorsed episodic use of marijuana and alcohol (he could not state when he last used). Pt also stated that he has not taken medication in several weeks.  During assessment, Pt presented as alert and oriented.  Pt was dressed in street clothes.  He appeared somewhat disheveled.  Pt's demeanor was restless.  Pt endorsed auditory hallucination which threaten him.  Pt denied depressive  symptoms.  Pt endorsed substance use.  He endorsed non-compliance with medication.  Pt's speech was rapid and loud.  Pt's thought processes were circumstantial.  Pt repeated himself several times.  There was no evidence of delusion.  Pt's memory and concentration were intact.  Pt's insight and judgment were poor.  Impulse control was deemed fair.  Consulted with Leighton Ruffina Okonkwo, NP who recommended that Pt remain at the ED, that he be stabilized and observed for safety, and that he be reassessed in the AM.  Diagnosis: 20.9 Schizophrenia  Past Medical History:  Past Medical History:  Diagnosis Date  . Homelessness   . Hypercholesterolemia   . Mental disorder   . Paranoid behavior (HCC)   . PE (pulmonary embolism)   . Schizophrenia (HCC)     History reviewed. No pertinent surgical history.  Family History:  Family History  Problem Relation Age of Onset  . Hypertension Mother     Social History:  reports that he has been smoking cigarettes.  He has been smoking about 0.50 packs per day. he has never used smokeless tobacco. He reports that he drinks alcohol. He reports that he uses drugs. Drug: Marijuana.  Additional Social History:  Alcohol / Drug Use Pain Medications: See MAR Prescriptions: See MAR Over the Counter: See MAR History of alcohol / drug use?: Yes Substance #1 Name of Substance 1: Marijuana 1 - Age of First Use: 18 1 - Amount (size/oz): varied 1 - Frequency: episodically 1 - Duration: ongoing 1 - Last Use / Amount: unknown Substance #2 Name of Substance 2:  alcohol 2 - Age of First Use: 16 2 - Amount (size/oz): varied 2 - Frequency: weekly 2 - Duration: ongoing 2 - Last Use / Amount: unknown  CIWA: CIWA-Ar BP: (!) 162/78 Pulse Rate: 97 COWS:    Allergies:  Allergies  Allergen Reactions  . Haldol [Haloperidol] Shortness Of Breath    Home Medications:  (Not in a hospital admission)  OB/GYN Status:  No LMP for male patient.  General Assessment  Data Location of Assessment: [facility] ED TTS Assessment: In system Is this a Tele or Face-to-Face Assessment?: Tele Assessment Is this an Initial Assessment or a Re-assessment for this encounter?: Initial Assessment Marital status: Single Living Arrangements: Alone, Other (Comment)(Pt said he has a home, but on 2/16 he endorsed homelessness) Can pt return to current living arrangement?: Yes Admission Status: Voluntary Is patient capable of signing voluntary admission?: Yes Referral Source: Self/Family/Friend Insurance type: Self-pay     Crisis Care Plan Living Arrangements: Alone, Other (Comment)(Pt said he has a home, but on 2/16 he endorsed homelessness) Legal Guardian: Other:(None) Name of Psychiatrist: Pt could not identify Name of Therapist: Pt denied  Education Status Is patient currently in school?: No Highest grade of school patient has completed: Pt reported, collage.  Name of school: NA Contact person: NA  Risk to self with the past 6 months Suicidal Ideation: No Has patient been a risk to self within the past 6 months prior to admission? : No Suicidal Intent: No Has patient had any suicidal intent within the past 6 months prior to admission? : No Is patient at risk for suicide?: No Suicidal Plan?: No Has patient had any suicidal plan within the past 6 months prior to admission? : No Access to Means: No What has been your use of drugs/alcohol within the last 12 months?: THC, alcohol, cigarettes(UDS and BAC not available at time of assessment) Previous Attempts/Gestures: No Triggers for Past Attempts: None known Intentional Self Injurious Behavior: None Family Suicide History: No Recent stressful life event(s): Other (Comment)(Increase in auditory hallucinations) Persecutory voices/beliefs?: Yes Depression: No Substance abuse history and/or treatment for substance abuse?: Yes Suicide prevention information given to non-admitted patients: Not applicable  Risk to  Others within the past 6 months Homicidal Ideation: No Does patient have any lifetime risk of violence toward others beyond the six months prior to admission? : No Thoughts of Harm to Others: No Current Homicidal Intent: No Current Homicidal Plan: No Access to Homicidal Means: No History of harm to others?: No Assessment of Violence: None Noted Does patient have access to weapons?: No Criminal Charges Pending?: No Describe Pending Criminal Charges: 2nd degree trespass Does patient have a court date: Yes Court Date: 02/20/18 Is patient on probation?: Unknown  Psychosis Hallucinations: Auditory Delusions: None noted  Mental Status Report Appearance/Hygiene: Poor hygiene, Other (Comment)(street clothes) Eye Contact: Good Motor Activity: Freedom of movement, Unremarkable Speech: Logical/coherent, Rapid Level of Consciousness: Alert, Restless Mood: Preoccupied Affect: Appropriate to circumstance Anxiety Level: Moderate Thought Processes: Circumstantial Judgement: Impaired Orientation: Person, Place, Time, Situation Obsessive Compulsive Thoughts/Behaviors: None  Cognitive Functioning Concentration: Good Memory: Remote Intact, Recent Intact IQ: Average Insight: Poor Impulse Control: Good Appetite: Good Sleep: No Change Vegetative Symptoms: None  ADLScreening [facility] Assessment Services) Patient's cognitive ability adequate to safely complete daily activities?: Yes Patient able to express need for assistance with ADLs?: Yes Independently performs ADLs?: Yes (appropriate for developmental age)  Prior Inpatient Therapy Prior Inpatient Therapy: Yes Prior Therapy Dates: January 2019, 2018 Prior Therapy Facilty/Provider(s): [facility] [place]  Reason for Treatment: Schizophrenia  Prior Outpatient Therapy Prior Outpatient Therapy: No Prior Therapy Dates: NA Prior Therapy Facilty/Provider(s): NA Reason for Treatment: NA Does patient have an ACCT team?: No Does patient have  Intensive In-House Services?  : No Does patient have [facility] services? : No Does patient have P4CC services?: No  ADL Screening (condition at time of admission) Patient's cognitive ability adequate to safely complete daily activities?: Yes Is the patient deaf or have difficulty hearing?: No Does the patient have difficulty seeing, even when wearing glasses/contacts?: No Does the patient have difficulty concentrating, remembering, or making decisions?: No Patient able to express need for assistance with ADLs?: Yes Does the patient have difficulty dressing or bathing?: No Independently performs ADLs?: Yes (appropriate for developmental age) Does the patient have difficulty walking or climbing stairs?: No Weakness of Legs: None Weakness of Arms/Hands: None  Home Assistive Devices/Equipment Home Assistive Devices/Equipment: None  Therapy Consults (therapy consults require a physician order) PT Evaluation Needed: No OT Evalulation Needed: No SLP Evaluation Needed: No Abuse/Neglect Assessment (Assessment to be complete while patient is alone) Abuse/Neglect Assessment Can Be Completed: Yes Physical Abuse: Denies Verbal Abuse: Denies Sexual Abuse: Denies Exploitation of patient/patient's resources: Denies Self-Neglect: Denies Values / Beliefs Cultural Requests During Hospitalization: None Spiritual Requests During Hospitalization: None Consults Spiritual Care Consult Needed: No Social Work Consult Needed: No Merchant navy officer (For Healthcare) Does Patient Have a Medical Advance Directive?: No Would patient like information on creating a medical advance directive?: No - Patient declined    Additional Information 1:1 In Past 12 Months?: No CIRT Risk: No Elopement Risk: No Does patient have medical clearance?: Yes     Disposition:  Disposition Initial Assessment Completed for this Encounter: Yes Disposition of Patient: Other dispositions, Re-evaluation by Psychiatry  recommended Other disposition(s): Other (Comment)(Keep overnight, stabilize, AM psych eval)  This service was provided via telemedicine using a 2-way, interactive audio and video technology.  Names of all persons participating in this telemedicine service and their role in this encounter. Name: Loretha Brasil Role: Patient             Earline Mayotte 01/14/2018 5:28 PM

## 2018-01-15 DIAGNOSIS — F1290 Cannabis use, unspecified, uncomplicated: Secondary | ICD-10-CM

## 2018-01-15 DIAGNOSIS — F17210 Nicotine dependence, cigarettes, uncomplicated: Secondary | ICD-10-CM

## 2018-01-15 DIAGNOSIS — F333 Major depressive disorder, recurrent, severe with psychotic symptoms: Secondary | ICD-10-CM

## 2018-01-15 NOTE — Discharge Instructions (Signed)
Follow up with the resources given to you by psychiatry

## 2018-01-15 NOTE — Consult Note (Signed)
Telepsych Consultation   Reason for Consult: Schizophrenia Referring Physician:  Delia Heady PA-C Location of Patient: [facility] ED Location of Provider: [facility] Department  Patient Identification: Cole Barrett MRN:  [id number] Principal Diagnosis: Schizophrenia, paranoid [facility]) Diagnosis:   Patient Active Problem List   Diagnosis Date Noted  . Schizophrenia ([place]) [F20.9] 12/24/2017  . 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 Schizophrenia, paranoid ([place]) [F20.0]   . MDD (major depressive disorder), recurrent, severe, with psychosis ([place]) [F33.3] 11/21/2017  . MDD (major depressive disorder) [F32.9] 09/22/2017  . Alcohol abuse [F10.10]   . Auditory hallucinations [R44.0]   . Alcohol abuse with alcohol-induced mood disorder ([place]) [F10.14] 09/08/2016  . Adjustment disorder with depressed mood [F43.21] 09/07/2016  . Homelessness [Z59.0] 09/03/2016  . Person feigning illness [Z76.5] 09/03/2016  . 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 Cannabis use disorder, mild, abuse [F12.10] 08/29/2016  . Pulmonary emboli ([place]) [I26.99] 07/13/2016  . Pulmonary embolism and infarction ([place]) [I26.99] 01/02/2014  . Acute left flank pain [R10.9] 01/02/2014  . Pulmonary embolism, bilateral ([place]) [I26.99] 01/02/2014    Total Time spent with patient: 20 minutes  Subjective:   Cole Barrett is a 32 y.o. male patient admitted with reports hearing voices due to being off his medications.    Per Initial Tele Assessment Note: Cole Barrett is a 32 y.o. male who presented to [facility] on a voluntary basis with complaint of significant auditory hallucination and non-compliance with medication.  Pt has been assessed by TTS on many occasions, most recently on 01/13/18.      Pt has a history of schizophrenia.  Pt was treated inpatient at [facility] and discharged on 12/28/17 with a bus pass, samples of medication, and  prescriptions.  Per report, Pt presented to [facility] on 12/29/17 with complaint that he ran out of his medication and that his medications do not work.  Pt was discharged after being given another prescription and with additional OPT resources.  Pt presented to [facility] again on 01/13/18 with complaint of significant auditory hallucination and stating that he had been out of medication for weeks.  Pt was discharged with outpatient resources.     On Exam: Patient was seen via tele-psych, chart reviewed with treatment team. Patient in bed, awake, alert and oriented x4. Patient reiterated the reason for this hospital admission as documented above. Patient stated, I came to the hospital yesterday because the voices where talking out louder than usual. But the voices are better today. I am ready to leave and follow up with [facility]. Patient stated that he missed taking his medication a few times and could be the reason the voices became so loud. He currently denies any SI/HI and stated that he follows up and gets his medication from [facility]  He reports occasional marijuana use but agrees to refrain from use. He denies any other concerns at this time and does not appear to be responding to internal stimuli during this encounter. Patient is currently prescribed Prozac and Risperdal for treatment of his thought disorder. Case discussed with Dr. Dwyane Dee who agrees with disposition to discharge home with outpatient follow up.   Past Psychiatric History: As in H&P  Risk to Self: Suicidal Ideation: No Suicidal Intent: No Is patient at risk for suicide?: No Suicidal Plan?: No Access to Means: No What has been your use of drugs/alcohol within the last 12 months?: THC, alcohol, cigarettes(UDS and BAC not available at time of assessment) Triggers for  Past Attempts: None known Intentional Self Injurious Behavior: None Risk to Others: Homicidal Ideation: No Thoughts of Harm to Others: No Current Homicidal Intent: No Current  Homicidal Plan: No Access to Homicidal Means: No History of harm to others?: No Assessment of Violence: None Noted Does patient have access to weapons?: No Criminal Charges Pending?: No Describe Pending Criminal Charges: 2nd degree trespass Does patient have a court date: Yes Court Date: 02/20/18 Prior Inpatient Therapy: Prior Inpatient Therapy: Yes Prior Therapy Dates: January 2019, 2018 Prior Therapy Facilty/Provider(s): [facility] Reason for Treatment: Schizophrenia Prior Outpatient Therapy: Prior Outpatient Therapy: No Prior Therapy Dates: NA Prior Therapy Facilty/Provider(s): NA Reason for Treatment: NA Does patient have an ACCT team?: No Does patient have Intensive In-House Services?  : No Does patient have [facility] services? : No Does patient have P4CC services?: No  Past Medical History:  Past Medical History:  Diagnosis Date  . Homelessness   . Hypercholesterolemia   . Mental disorder   . Paranoid behavior ([place])   . PE (pulmonary embolism)   . Schizophrenia ([place])    History reviewed. No pertinent surgical history. Family History:  Family History  Problem Relation Age of Onset  . Hypertension Mother    Family Psychiatric  History: Unknown  Social History:  Social History   Substance and Sexual Activity  Alcohol Use Yes   Comment: socially     Social History   Substance and Sexual Activity  Drug Use Yes  . Types: Marijuana   Comment: Once every 2-3 months, Last used: Last month    Social History   Socioeconomic History  . Marital status: Single    Spouse name: None  . Number of children: None  . Years of education: None  . Highest education level: None  Social Needs  . Financial resource strain: None  . Food insecurity - worry: None  . Food insecurity - inability: None  . Transportation needs - medical: None  . Transportation needs - non-medical: None  Occupational History  . None  Tobacco Use  . Smoking status: Current Every Day Smoker     Packs/day: 0.50    Types: Cigarettes  . Smokeless tobacco: Never Used  Substance and Sexual Activity  . Alcohol use: Yes    Comment: socially  . Drug use: Yes    Types: Marijuana    Comment: Once every 2-3 months, Last used: Last month  . Sexual activity: No  Other Topics Concern  . None  Social History Narrative  . None   Additional Social History:    Allergies:   Allergies  Allergen Reactions  . Haldol [Haloperidol] Shortness Of Breath    Labs:  Results for orders placed or performed during the hospital encounter of 01/14/18 (from the past 48 hour(s))  Comprehensive metabolic panel     Status: Abnormal   Collection Time: 01/14/18 12:09 PM  Result Value Ref Range   Sodium 140 135 - 145 mmol/L   Potassium 3.6 3.5 - 5.1 mmol/L   Chloride 106 101 - 111 mmol/L   CO2 23 22 - 32 mmol/L   Glucose, Bld 134 (H) 65 - 99 mg/dL   BUN 8 6 - 20 mg/dL   Creatinine, Ser 1.14 0.61 - 1.24 mg/dL   Calcium 8.9 8.9 - 10.3 mg/dL   Total Protein 6.9 6.5 - 8.1 g/dL   Albumin 4.0 3.5 - 5.0 g/dL   AST 32 15 - 41 U/L   ALT 32 17 - 63 U/L  Alkaline Phosphatase 46 38 - 126 U/L   Total Bilirubin 0.6 0.3 - 1.2 mg/dL   GFR calc non Af Amer >60 >60 mL/min   GFR calc Af Amer >60 >60 mL/min    Comment: (NOTE) The eGFR has been calculated using the CKD EPI equation. This calculation has not been validated in all clinical situations. eGFR's persistently <60 mL/min signify possible Chronic Kidney Disease.    Anion gap 11 5 - 15    Comment: Performed at [facility] [address] [place], [place] [address]  Ethanol     Status: None   Collection Time: 01/14/18 12:09 PM  Result Value Ref Range   Alcohol, Ethyl (B) <10 <10 mg/dL    Comment:        LOWEST DETECTABLE LIMIT FOR SERUM ALCOHOL IS 10 mg/dL FOR MEDICAL PURPOSES ONLY Performed at [facility], [place] [address], [place], [place] [address]   cbc     Status: Abnormal   Collection Time: 01/14/18 12:09 PM  Result Value  Ref Range   WBC 3.8 (L) 4.0 - 10.5 K/uL   RBC 4.59 4.22 - 5.81 MIL/uL   Hemoglobin 13.0 13.0 - 17.0 g/dL   HCT 39.5 39.0 - 52.0 %   MCV 86.1 78.0 - 100.0 fL   MCH 28.3 26.0 - 34.0 pg   MCHC 32.9 30.0 - 36.0 g/dL   RDW 13.1 11.5 - 15.5 %   Platelets 289 150 - 400 K/uL    Comment: Performed at [facility], [place] [address], [place], [place] [address]  Rapid urine drug screen (hospital performed)     Status: None   Collection Time: 01/14/18 12:09 PM  Result Value Ref Range   Opiates NONE DETECTED NONE DETECTED   Cocaine NONE DETECTED NONE DETECTED   Benzodiazepines NONE DETECTED NONE DETECTED   Amphetamines NONE DETECTED NONE DETECTED   Tetrahydrocannabinol NONE DETECTED NONE DETECTED   Barbiturates NONE DETECTED NONE DETECTED    Comment: (NOTE) DRUG SCREEN FOR MEDICAL PURPOSES ONLY.  IF CONFIRMATION IS NEEDED FOR ANY PURPOSE, NOTIFY LAB WITHIN 5 DAYS. LOWEST DETECTABLE LIMITS FOR URINE DRUG SCREEN Drug Class                     Cutoff (ng/mL) Amphetamine and metabolites    1000 Barbiturate and metabolites    200 Benzodiazepine                 836 Tricyclics and metabolites     300 Opiates and metabolites        300 Cocaine and metabolites        300 THC                            50 Performed at [facility], [place] [address] [place], [place] [address]     Medications:  Current Facility-Administered Medications  Medication Dose Route Frequency Provider Last Rate Last Dose  . benztropine (COGENTIN) tablet 0.5 mg  0.5 mg Oral Lowanda Foster, MD   0.5 mg at 01/14/18 2157  . FLUoxetine (PROZAC) capsule 20 mg  20 mg Oral Daily Davonna Belling, MD   20 mg at 01/14/18 1955  . hydrOXYzine (ATARAX/VISTARIL) tablet 25 mg  25 mg Oral TID PRN Davonna Belling, MD      . risperiDONE (RISPERDAL) tablet 2 mg  2 mg Oral BID Davonna Belling, MD   2 mg  at 01/14/18 2157  . traZODone (DESYREL) tablet 50 mg  50 mg Oral QHS PRN Davonna Belling, MD       Current  Outpatient Medications  Medication Sig Dispense Refill  . benztropine (COGENTIN) 0.5 MG tablet Take 1 tablet (0.5 mg total) by mouth at bedtime. For prevention of drug induced tremors (Patient not taking: Reported on 01/13/2018) 10 tablet 0  . FLUoxetine (PROZAC) 20 MG capsule Take 1 capsule (20 mg total) by mouth daily. For depression (Patient not taking: Reported on 01/13/2018) 10 capsule 0  . hydrOXYzine (ATARAX/VISTARIL) 25 MG tablet Take 1 tablet (25 mg total) by mouth 3 (three) times daily as needed for anxiety. (Patient not taking: Reported on 01/13/2018) 20 tablet 0  . risperiDONE (RISPERDAL) 2 MG tablet Take 1 tablet (2 mg total) by mouth 2 (two) times daily. For mood control (Patient not taking: Reported on 01/13/2018) 20 tablet 0  . traZODone (DESYREL) 50 MG tablet Take 1 tablet (50 mg total) by mouth at bedtime as needed for sleep. (Patient not taking: Reported on 01/13/2018) 10 tablet 0    Musculoskeletal: UTA via camera  Psychiatric Specialty Exam: Physical Exam  Nursing note and vitals reviewed.   Review of Systems  Psychiatric/Behavioral: Negative for depression, hallucinations, memory loss, substance abuse and suicidal ideas. The patient is not nervous/anxious and does not have insomnia.   All other systems reviewed and are negative.   Blood pressure (!) 106/47, pulse 100, temperature 98.1 F (36.7 C), temperature source Oral, resp. rate 18, height _0  (1.778 m), weight 99.8 kg (220 lb), SpO2 100 %.Body mass index is 31.57 kg/m.  General Appearance: in hospital scrub  Eye Contact:  Good  Speech:  Clear and Coherent and Normal Rate  Volume:  Normal  Mood:  euthymic  Affect:  Congruent  Thought Process:  Coherent and Goal Directed  Orientation:  Full (Time, Place, and Person)  Thought Content:  WDL  Suicidal Thoughts:  No  Homicidal Thoughts:  No  Memory:  Immediate;   Good Recent;   Good Remote;   Fair  Judgement:  Good  Insight:  Present  Psychomotor Activity:   Normal  Concentration:  Concentration: Good and Attention Span: Good  Recall:  Good  Fund of Knowledge:  Good  Language:  Good  Akathisia:  Negative  Handed:  Right  AIMS (if indicated):     Assets:  Communication Skills Desire for Improvement Housing Leisure Time Physical Health Social Support  ADL's:  Intact  Cognition:  WNL  Sleep:        Treatment Plan Summary: Plan discharge home  Follow up with [facility] for therapy and medication management Take all medications as prescribed Avoid the use of alcohol and/or drugs Stay well hydrated Activity as tolerated Follow up with PCP for any new or existing medical concerns   Disposition: No evidence of imminent risk to self or others at present.   Patient does not meet criteria for psychiatric inpatient admission. Supportive therapy provided about ongoing stressors. Refer to IOP. Discussed crisis plan, support from social network, calling 911, coming to the Emergency Department, and calling Suicide Hotline.  This service was provided via telemedicine using a 2-way, interactive audio and video technology.  Names of all persons participating in this telemedicine service and their role in this encounter. Name: Lauretta Grill Role: Patient  Name: Niel Hummer  Role: NP           Elmarie Shiley, NP 01/15/2018  9:21 AM

## 2018-01-15 NOTE — ED Triage Notes (Signed)
TTS compleated

## 2018-01-15 NOTE — ED Provider Notes (Signed)
Pt cleared by psychiatry for discharge and outpatient follow up.     Linwood DibblesKnapp, Morna Flud, MD 01/15/18 [phone]

## 2018-01-15 NOTE — Progress Notes (Signed)
Patient reassessed by Psychiatry.  Patient does not meet inpatient criteria per Laura Davis, NP and Dr. Kumar.  Patient is recommended for discharge. [facility] ED Nurse, notified.  Laurrie Toppin T. Saharra Santo, MSW, LCSWA Disposition Clinical Social Work [phone] (cell) [phone] (office)    

## 2018-01-15 NOTE — ED Notes (Signed)
Declined W/C at D/C and was escorted to lobby by RN. 

## 2018-01-28 ENCOUNTER — Encounter (HOSPITAL_COMMUNITY): Payer: Self-pay | Admitting: Emergency Medicine

## 2018-01-28 ENCOUNTER — Emergency Department (HOSPITAL_COMMUNITY)
Admission: EM | Admit: 2018-01-28 | Discharge: 2018-01-29 | Disposition: A | Discharge status: Home / Self Care | Payer: Self-pay | Attending: Emergency Medicine | Admitting: Emergency Medicine

## 2018-01-28 ENCOUNTER — Other Ambulatory Visit: Payer: Self-pay

## 2018-01-28 DIAGNOSIS — F209 Schizophrenia, unspecified: Secondary | ICD-10-CM | POA: Insufficient documentation

## 2018-01-28 DIAGNOSIS — R440 Auditory hallucinations: Secondary | ICD-10-CM | POA: Diagnosis present

## 2018-01-28 DIAGNOSIS — F17210 Nicotine dependence, cigarettes, uncomplicated: Secondary | ICD-10-CM | POA: Insufficient documentation

## 2018-01-28 DIAGNOSIS — Z046 Encounter for general psychiatric examination, requested by authority: Secondary | ICD-10-CM | POA: Insufficient documentation

## 2018-01-28 LAB — CBC
HCT: 39 % (ref 39.0–52.0)
Hemoglobin: 12.8 g/dL — ABNORMAL LOW (ref 13.0–17.0)
MCH: 28.3 pg (ref 26.0–34.0)
MCHC: 32.8 g/dL (ref 30.0–36.0)
MCV: 86.3 fL (ref 78.0–100.0)
PLATELETS: 300 10*3/uL (ref 150–400)
RBC: 4.52 MIL/uL (ref 4.22–5.81)
RDW: 13.3 % (ref 11.5–15.5)
WBC: 6.2 10*3/uL (ref 4.0–10.5)

## 2018-01-28 LAB — COMPREHENSIVE METABOLIC PANEL
ALT: 40 U/L (ref 17–63)
ANION GAP: 9 (ref 5–15)
AST: 33 U/L (ref 15–41)
Albumin: 4.1 g/dL (ref 3.5–5.0)
Alkaline Phosphatase: 51 U/L (ref 38–126)
BILIRUBIN TOTAL: 0.7 mg/dL (ref 0.3–1.2)
BUN: 11 mg/dL (ref 6–20)
CHLORIDE: 105 mmol/L (ref 101–111)
CO2: 25 mmol/L (ref 22–32)
Calcium: 8.9 mg/dL (ref 8.9–10.3)
Creatinine, Ser: 1.15 mg/dL (ref 0.61–1.24)
Glucose, Bld: 105 mg/dL — ABNORMAL HIGH (ref 65–99)
POTASSIUM: 4 mmol/L (ref 3.5–5.1)
Sodium: 139 mmol/L (ref 135–145)
TOTAL PROTEIN: 7.3 g/dL (ref 6.5–8.1)

## 2018-01-28 LAB — RAPID URINE DRUG SCREEN, HOSP PERFORMED
Amphetamines: NOT DETECTED
BENZODIAZEPINES: NOT DETECTED
Barbiturates: NOT DETECTED
COCAINE: NOT DETECTED
OPIATES: NOT DETECTED
Tetrahydrocannabinol: NOT DETECTED

## 2018-01-28 LAB — ETHANOL

## 2018-01-28 LAB — SALICYLATE LEVEL

## 2018-01-28 LAB — ACETAMINOPHEN LEVEL

## 2018-01-28 NOTE — ED Triage Notes (Addendum)
Patient states he is starting to hear voices. Patient states that he need to get this problem taking care of. He needs to speak to the doctor. Patient states he does not take any medications. Patient does not want to hurt himself.

## 2018-01-28 NOTE — ED Notes (Signed)
Pt reports having auditory hallucinations saying that they are going to kill him. The voices have been quieter, but got really loud this evening.

## 2018-01-28 NOTE — Progress Notes (Signed)
Per Cole ConnJason Berry, NP pt is recommended for continued observation and to be reassessed in the AM by psychiatry. EDP Khatri, Hina, PA-C and pt's nurse advised of the disposition.  Cole BruinsAquicha Janelly Switalski, MSW, LCSW Therapeutic Triage Specialist  [phone]

## 2018-01-28 NOTE — BH Assessment (Addendum)
Assessment Note  Cole Barrett is an 32 y.o. male who presents to the ED voluntarily. Pt reports he has been experiencing worsening AH that tell him they are going to kill him. Pt states he hears the voices often but states today they became very loud, prompting him to return to the ED. Pt has multiple ED visits c/o similar concerns. Pt has 16 ED visits in the past 6 months. Pt denies that he is suicidal but states the voices say they are going to kill him. Pt has been assessed by TTS multiple times including 01/13/18, 01/14/18, 12/29/17, 12/24/17, 12/17/17, 12/16/17, and 12/14/17. Pt frequently visits the ED stating "voices are telling me they are going to kill me."   Pt is smiling throughout the assessment and asks this writer if he can have more crackers. Pt states he does not have a current provider. Pt states he would like to speak with the doctor in the AM and "they can help me out." Pt has a hx of homelessness but denies that he is currently homeless, states he lives alone "out [address]."   Per [name], NP pt is recommended for continued observation and to be reassessed in the AM by psychiatry. EDP Khatri, Hina, PA-C and pt's nurse advised of the disposition.  Diagnosis: Schizophrenia   Past Medical History:  Past Medical History:  Diagnosis Date  . Homelessness   . Hypercholesterolemia   . Mental disorder   . Paranoid behavior (HCC)   . PE (pulmonary embolism)   . Schizophrenia (HCC)     History reviewed. No pertinent surgical history.  Family History:  Family History  Problem Relation Age of Onset  . Hypertension Mother     Social History:  reports that he has been smoking cigarettes.  He has been smoking about 0.50 packs per day. he has never used smokeless tobacco. He reports that he drinks alcohol. He reports that he uses drugs. Drug: Marijuana.  Additional Social History:  Alcohol / Drug Use Pain Medications: See MAR Prescriptions: See MAR Over the Counter: See  MAR History of alcohol / drug use?: Yes Substance #1 Name of Substance 1: Marijuana(per chart review ) 1 - Age of First Use: 18 1 - Last Use / Amount: pt denies recent use, unable to recall last use  Substance #2 Name of Substance 2: alcohol(per chart review ) 2 - Age of First Use: 16 2 - Last Use / Amount: pt denies recent use, unable to recall last use   CIWA: CIWA-Ar BP: 140/80 Pulse Rate: 92 COWS:    Allergies:  Allergies  Allergen Reactions  . Haldol [Haloperidol] Shortness Of Breath    Home Medications:  (Not in a hospital admission)  OB/GYN Status:  No LMP for male patient.  General Assessment Data Assessment unable to be completed: Yes Reason for not completing assessment: Pt is in the hallway. TTS cannot assess due to confidentiality when pt is not in a private room. TTS will assess when pt is in private room or conference room.  Location of Assessment: [facility] TTS Assessment: In system Is this a Tele or Face-to-Face Assessment?: Face-to-Face Is this an Initial Assessment or a Re-assessment for this encounter?: Initial Assessment Marital status: Single Is patient pregnant?: No Pregnancy Status: No Living Arrangements: Alone Can pt return to current living arrangement?: Yes Admission Status: Voluntary Is patient capable of signing voluntary admission?: Yes Referral Source: Self/Family/Friend Insurance type: none     Crisis Care Plan Living Arrangements: Alone Name  of Psychiatrist: none Name of Therapist: none  Education Status Is patient currently in school?: No Highest grade of school patient has completed: some college   Risk to self with the past 6 months Suicidal Ideation: No Has patient been a risk to self within the past 6 months prior to admission? : No Suicidal Intent: No Has patient had any suicidal intent within the past 6 months prior to admission? : No Is patient at risk for suicide?: No Suicidal Plan?: No Has patient had any suicidal  plan within the past 6 months prior to admission? : No Access to Means: No What has been your use of drugs/alcohol within the last 12 months?: denies use, chart review states pt has a hx of alcohol and cannabis use  Previous Attempts/Gestures: No Triggers for Past Attempts: None known Intentional Self Injurious Behavior: None Family Suicide History: No Recent stressful life event(s): Other (Comment)(AH) Persecutory voices/beliefs?: Yes Depression: No Substance abuse history and/or treatment for substance abuse?: Yes Suicide prevention information given to non-admitted patients: Not applicable  Risk to Others within the past 6 months Homicidal Ideation: No Does patient have any lifetime risk of violence toward others beyond the six months prior to admission? : No Thoughts of Harm to Others: No Current Homicidal Intent: No Current Homicidal Plan: No Access to Homicidal Means: No History of harm to others?: No Assessment of Violence: None Noted Does patient have access to weapons?: No Criminal Charges Pending?: No Does patient have a court date: No(pt denies, chart review reports pt has 02/20/18 court date) Is patient on probation?: No  Psychosis Hallucinations: Auditory, With command Delusions: None noted  Mental Status Report Appearance/Hygiene: Body odor, In scrubs Eye Contact: Good Motor Activity: Freedom of movement Speech: Logical/coherent, Slow Level of Consciousness: Quiet/awake Mood: Euthymic Affect: Flat Anxiety Level: None Thought Processes: Relevant, Coherent Judgement: Impaired Orientation: Place, Person, Appropriate for developmental age, Time, Situation Obsessive Compulsive Thoughts/Behaviors: None  Cognitive Functioning Concentration: Normal Memory: Remote Intact, Recent Intact IQ: Average Insight: Fair Impulse Control: Fair Appetite: Good Sleep: No Change Total Hours of Sleep: 8 Vegetative Symptoms: None  ADLScreening [facility] Assessment  Services) Patient's cognitive ability adequate to safely complete daily activities?: Yes Patient able to express need for assistance with ADLs?: Yes Independently performs ADLs?: Yes (appropriate for developmental age)  Prior Inpatient Therapy Prior Inpatient Therapy: Yes Prior Therapy Dates: January 2019, 2018 Prior [facility] Reason for Treatment: Schizophrenia  Prior Outpatient Therapy Prior Outpatient Therapy: No Does patient have an ACCT team?: No Does patient have Intensive In-House Services?  : No Does patient have [facility] services? : No Does patient have P4CC services?: No  ADL Screening (condition at time of admission) Patient's cognitive ability adequate to safely complete daily activities?: Yes Is the patient deaf or have difficulty hearing?: No Does the patient have difficulty seeing, even when wearing glasses/contacts?: No Does the patient have difficulty concentrating, remembering, or making decisions?: No Patient able to express need for assistance with ADLs?: Yes Does the patient have difficulty dressing or bathing?: No Independently performs ADLs?: Yes (appropriate for developmental age) Does the patient have difficulty walking or climbing stairs?: No Weakness of Legs: None Weakness of Arms/Hands: None  Home Assistive Devices/Equipment Home Assistive Devices/Equipment: None    Abuse/Neglect Assessment (Assessment to be complete while patient is alone) Abuse/Neglect Assessment Can Be Completed: Yes Physical Abuse: Denies Verbal Abuse: Denies Sexual Abuse: Denies Exploitation of patient/patient's resources: Denies Self-Neglect: Denies     Advance Directives (For  Healthcare) Does Patient Have a Medical Advance Directive?: No Would patient like information on creating a medical advance directive?: No - Patient declined    Additional Information 1:1 In Past 12 Months?: No CIRT Risk: No Elopement Risk: No Does patient  have medical clearance?: Yes     Disposition: Per [name], NP pt is recommended for continued observation and to be reassessed in the AM by psychiatry. EDP Khatri, Hina, PA-C and pt's nurse advised of the disposition.  Disposition Initial Assessment Completed for this Encounter: Yes Disposition of Patient: Re-evaluation by Psychiatry recommended(per [name], NP)  On Site Evaluation by:   Reviewed with Physician:    [name] 01/28/2018 11:00 PM

## 2018-01-28 NOTE — Progress Notes (Signed)
TTS will assess when pt is in private room or conference room.   Cole Barrett, MSW, LCSW Therapeutic Triage Specialist  [phone]

## 2018-01-28 NOTE — ED Notes (Signed)
Pt has three bags (two pt belongings' bags and one white plato's closet bag) in the cabinet labeled "pt belongings 9-12 [address]."

## 2018-01-28 NOTE — ED Notes (Signed)
Pt given a sprite, a ham sandwich, cheese, and crackers.

## 2018-01-28 NOTE — ED Notes (Signed)
[facility] Expected date:  Expected time:  Means of arrival:  Comments: Ouk

## 2018-01-28 NOTE — ED Provider Notes (Signed)
[facility]-EMERGENCY DEPT Provider Note   CSN: [id number] Arrival date & time: 01/28/18  2035     History   Chief Complaint Chief Complaint  Patient presents with  . Medical Clearance    HPI Cole Barrett is a 32 y.o. male a past medical history of schizophrenia, who presents to ED for evaluation of auditory hallucinations.  He states that he was sitting at his house watching TV when all of a sudden he began hearing very loud voices telling him to hurt himself.  He states that he is heard these voices in the past but "never this loud."  He denies any medication use for his schizophrenia.  Denies any SI, HI, visual hallucinations.  Denies any other symptoms at this time. Denies tobacco, alcohol or other drug use.  HPI  Past Medical History:  Diagnosis Date  . Homelessness   . Hypercholesterolemia   . Mental disorder   . Paranoid behavior (HCC)   . PE (pulmonary embolism)   . Schizophrenia [facility](HCC)     Patient Active Problem List   Diagnosis Date Noted  . Schizophrenia (HCC) 12/24/2017  . 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 Schizophrenia, paranoid (HCC)   . 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 MDD (major depressive disorder) 09/22/2017  . Alcohol abuse   . Auditory hallucinations   . Alcohol abuse with alcohol-induced mood disorder (HCC) 09/08/2016  . Adjustment disorder with depressed mood 09/07/2016  . Homelessness 09/03/2016  . Person feigning illness 09/03/2016  . 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 Cannabis use disorder, mild, abuse 08/29/2016  . Pulmonary emboli (HCC) 07/13/2016  . 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 Acute left flank pain 01/02/2014  . Pulmonary embolism, bilateral (HCC) 01/02/2014    History reviewed. No pertinent surgical history.     Home Medications    Prior to Admission medications     Medication Sig Start Date End Date Taking? Authorizing Provider  benztropine (COGENTIN) 0.5 MG tablet Take 1 tablet (0.5 mg total) by mouth at bedtime. For prevention of drug induced tremors Patient not taking: Reported on 01/13/2018 12/29/17   Muthersbaugh, Dahlia ClientHannah, PA-C  FLUoxetine (PROZAC) 20 MG capsule Take 1 capsule (20 mg total) by mouth daily. For depression Patient not taking: Reported on 01/13/2018 12/29/17   Muthersbaugh, Dahlia ClientHannah, PA-C  hydrOXYzine (ATARAX/VISTARIL) 25 MG tablet Take 1 tablet (25 mg total) by mouth 3 (three) times daily as needed for anxiety. Patient not taking: Reported on 01/13/2018 12/29/17   Muthersbaugh, Dahlia ClientHannah, PA-C  risperiDONE (RISPERDAL) 2 MG tablet Take 1 tablet (2 mg total) by mouth 2 (two) times daily. For mood control Patient not taking: Reported on 01/13/2018 12/29/17   Muthersbaugh, Dahlia ClientHannah, PA-C  traZODone (DESYREL) 50 MG tablet Take 1 tablet (50 mg total) by mouth at bedtime as needed for sleep. Patient not taking: Reported on 01/13/2018 12/29/17   Muthersbaugh, Dahlia ClientHannah, PA-C    Family History Family History  Problem Relation Age of Onset  . Hypertension Mother     Social History Social History   Tobacco Use  . Smoking status: Current Every Day Smoker    Packs/day: 0.50    Types: Cigarettes  . Smokeless tobacco: Never Used  Substance Use Topics  . Alcohol use: Yes    Comment: socially  . Drug use: Yes    Types: Marijuana    Comment: Once every 2-3 months, Last used: Last month  Allergies   Haldol [haloperidol]   Review of Systems Review of Systems  Constitutional: Negative for appetite change, chills and fever.  HENT: Negative for ear pain, rhinorrhea, sneezing and sore throat.   Eyes: Negative for photophobia and visual disturbance.  Respiratory: Negative for cough, chest tightness, shortness of breath and wheezing.   Cardiovascular: Negative for chest pain and palpitations.  Gastrointestinal: Negative for abdominal pain, blood in stool,  constipation, diarrhea, nausea and vomiting.  Genitourinary: Negative for dysuria, hematuria and urgency.  Musculoskeletal: Negative for myalgias.  Skin: Negative for rash.  Neurological: Negative for dizziness, weakness and light-headedness.  Psychiatric/Behavioral: Positive for hallucinations. Negative for self-injury, sleep disturbance and suicidal ideas.     Physical Exam Updated Vital Signs BP 140/80 (BP Location: Left Arm)   Pulse 92   Temp 97.6 F (36.4 C) (Oral)   Resp 18   Ht 5\' 10"  (1.778 m)   Wt 99.8 kg (220 lb)   SpO2 99%   BMI 31.57 kg/m   Physical Exam  Constitutional: He appears well-developed and well-nourished. No distress.  HENT:  Head: Normocephalic and atraumatic.  Nose: Nose normal.  Eyes: Conjunctivae and EOM are normal. Left eye exhibits no discharge. No scleral icterus.  Neck: Normal range of motion. Neck supple.  Cardiovascular: Normal rate, regular rhythm, normal heart sounds and intact distal pulses. Exam reveals no gallop and no friction rub.  No murmur heard. Pulmonary/Chest: Effort normal and breath sounds normal. No respiratory distress.  Abdominal: Soft. Bowel sounds are normal. He exhibits no distension. There is no tenderness. There is no guarding.  Musculoskeletal: Normal range of motion. He exhibits no edema.  Neurological: He is alert. He exhibits normal muscle tone. Coordination normal.  Skin: Skin is warm and dry. No rash noted.  Psychiatric: He has a normal mood and affect. He is actively hallucinating.  Nursing note and vitals reviewed.    ED Treatments / Results  Labs (all labs ordered are listed, but only abnormal results are displayed) Labs Reviewed  CBC - Abnormal; Notable for the following components:      Result Value   Hemoglobin 12.8 (*)    All other components within normal limits  RAPID URINE DRUG SCREEN, HOSP PERFORMED  COMPREHENSIVE METABOLIC PANEL  ETHANOL  SALICYLATE LEVEL  ACETAMINOPHEN LEVEL    EKG  EKG  Interpretation None       Radiology No results found.  Procedures Procedures (including critical care time)  Medications Ordered in ED Medications - No data to display   Initial Impression / Assessment and Plan / ED Course  I have reviewed the triage vital signs and the nursing notes.  Pertinent labs & imaging results that were available during my care of the patient were reviewed by me and considered in my medical decision making (see chart for details).     Patient presents to ED for evaluation of auditory hallucinations that began prior to arrival while he was watching TV. He does have a history of schizophrenia, prior evaluations in the ED for hallucinations. Does not take any medication for his schizophrenia. Denies any other symptoms at this time. Denies SI, HI. Medical screening labwork is unremarkable. He is medically cleared for TTS evaluation, who recommends AM psych evaluation.  Portions of this note were generated with Scientist, clinical (histocompatibility and immunogenetics). Dictation errors may occur despite best attempts at proofreading.   Final Clinical Impressions(s) / ED Diagnoses   Final diagnoses:  None    ED Discharge Orders    None  Dietrich Pates, PA-C 01/28/18 2249    Shaune Pollack, MD 01/29/18 [phone]

## 2018-01-29 NOTE — Discharge Instructions (Signed)
For your mental health needs, you are advised to follow up with [facility].  New and returning patients are seen at their walk-in clinic.  Walk-in hours are Monday - Friday from 8:00 am - 3:00 pm.  Walk-in patients are seen on a first come, first served basis.  Try to arrive as early as possible for he best chance of being seen the same day: ° °     [facility] °     [address] °     [place], [place] [address] °     [phone] °

## 2018-01-29 NOTE — ED Notes (Addendum)
SBAR Report received from previous nurse. Pt received calm and visible on unit asking for food though he was given food prior to coming over. Pt  and appears otherwise stable and free of distress. Pt reminded of camera surveillance, q 15 min rounds, and rules of the milieu. Will continue to assess.

## 2018-01-29 NOTE — BH Assessment (Signed)
[facility] Assessment Progress Note  Per Jacqueline Norman, DO, this pt does not require psychiatric hospitalization at this time.  Pt is to be discharged from [facility] with recommendation to follow up with [facility].  This has been included in pt's discharge instructions.  Pt's nurse, Ashley, has been notified.  Renella Steig, MA Triage Specialist [phone]     

## 2018-01-29 NOTE — ED Notes (Signed)
Pt d/c home per MD order. Discharge summary reviewed with pt. Pt verbalizes understanding. Pt denies SI/HI/AVH. Personal property returned to pt. Pt signed e-signature. Ambulatory off unit with [facility].  

## 2018-01-29 NOTE — [facility] Suicide Risk Assessment (Cosign Needed)
Suicide Risk Assessment  Discharge Assessment   [facility]   Principal Problem: Auditory hallucinations Discharge Diagnoses:  Patient Active Problem List   Diagnosis Date Noted  . Auditory hallucinations [R44.0]     Priority: Medium  . Schizophrenia (HCC) [F20.9] 12/24/2017  . 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 MDD (major depressive disorder), recurrent, severe, with psychosis (HCC) [F33.3] 11/21/2017  . MDD (major depressive disorder) [F32.9] 09/22/2017  . Alcohol abuse [F10.10]   . Alcohol abuse with alcohol-induced mood disorder (HCC) [F10.14] 09/08/2016  . Adjustment disorder with depressed mood [F43.21] 09/07/2016  . Homelessness [Z59.0] 09/03/2016  . Person feigning illness [Z76.5] 09/03/2016  . 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 Cannabis use disorder, mild, abuse [F12.10] 08/29/2016  . Pulmonary emboli (HCC) [I26.99] 07/13/2016  . Pulmonary embolism and infarction (HCC) [I26.99] 01/02/2014  . Acute left flank pain [R10.9] 01/02/2014  . Pulmonary embolism, bilateral (HCC) [I26.99] 01/02/2014   Pt was seen and chart reviewed with treatment team and Dr Sharma CovertNorman. Pt stated he was hearing loud voices and decided to come to the ED. Pt has a history of auditory hallucinations and is well known to this ED with 16 visits in the past 6 months. Pt stated the voices were quieter today and he is ready to be discharged. [facility] and will follow up there upon discharge. 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 Pt reports good sleep, 8 hrs per night and a healthy appetite.  Total Time spent with patient: 45 minutes  Musculoskeletal: Strength & Muscle Tone: within normal limits Gait & Station: normal Patient leans: N/A  Psychiatric Specialty Exam:   Blood pressure 112/74, pulse 74, temperature 98.2  F (36.8 C), temperature source Oral, resp. rate 18, height 5\' 10"  (1.778 m), weight 99.8 kg (220 lb), SpO2 96 %.Body mass index is 31.57 kg/m.  General Appearance: Disheveled  Eye Contact::  Good  Speech:  Clear and Coherent and Normal Rate409  Volume:  Normal  Mood:  Euthymic  Affect:  Congruent  Thought Process:  Coherent, Goal Directed and Linear  Orientation:  Full (Time, Place, and Person)  Thought Content:  Hallucinations: Auditory  Suicidal Thoughts:  No  Homicidal Thoughts:  No  Memory:  Immediate;   Good Recent;   Good Remote;   Fair  Judgement:  Fair  Insight:  Fair  Psychomotor Activity:  Normal  Concentration:  Good  Recall:  Good  Fund of Knowledge:Good  Language: Good  Akathisia:  No  Handed:  Right  AIMS (if indicated):     Assets:  Communication Skills Resilience Social Support  Sleep:     Cognition: WNL  ADL's:  Intact   Mental Status Per Nursing Assessment::   On Admission:     Demographic Factors:  Male, Low socioeconomic status and Unemployed  Loss Factors: Financial problems/change in socioeconomic status  Historical Factors: Impulsivity  Risk Reduction Factors:   Sense of responsibility to family  Continued Clinical Symptoms:  Schizophrenia:   Paranoid or undifferentiated type  Cognitive Features That Contribute To Risk:  Closed-mindedness    Suicide Risk:  Minimal: No identifiable suicidal ideation.  Patients presenting with no risk factors but with morbid ruminations; may be classified as minimal risk based on the severity of the depressive symptoms    Plan Of Care/Follow-up recommendations:  Activity:  as tolerated Diet:  Heart  Healthy  Laveda Abbe, NP 01/29/2018, 3:09 PM

## 2018-02-02 ENCOUNTER — Encounter (HOSPITAL_COMMUNITY): Payer: Self-pay | Admitting: Emergency Medicine

## 2018-02-02 ENCOUNTER — Other Ambulatory Visit: Payer: Self-pay

## 2018-02-02 ENCOUNTER — Emergency Department (HOSPITAL_COMMUNITY)
Admission: EM | Admit: 2018-02-02 | Discharge: 2018-02-02 | Disposition: A | Discharge status: Home / Self Care | Payer: Self-pay | Attending: Emergency Medicine | Admitting: Emergency Medicine

## 2018-02-02 DIAGNOSIS — F17210 Nicotine dependence, cigarettes, uncomplicated: Secondary | ICD-10-CM | POA: Insufficient documentation

## 2018-02-02 DIAGNOSIS — Z046 Encounter for general psychiatric examination, requested by authority: Secondary | ICD-10-CM | POA: Insufficient documentation

## 2018-02-02 DIAGNOSIS — R440 Auditory hallucinations: Secondary | ICD-10-CM

## 2018-02-02 DIAGNOSIS — F209 Schizophrenia, unspecified: Secondary | ICD-10-CM | POA: Insufficient documentation

## 2018-02-02 DIAGNOSIS — R45851 Suicidal ideations: Secondary | ICD-10-CM | POA: Insufficient documentation

## 2018-02-02 LAB — COMPREHENSIVE METABOLIC PANEL
ALBUMIN: 4 g/dL (ref 3.5–5.0)
ALK PHOS: 52 U/L (ref 38–126)
ALT: 38 U/L (ref 17–63)
AST: 26 U/L (ref 15–41)
Anion gap: 14 (ref 5–15)
BILIRUBIN TOTAL: 0.5 mg/dL (ref 0.3–1.2)
BUN: 11 mg/dL (ref 6–20)
CALCIUM: 8.9 mg/dL (ref 8.9–10.3)
CO2: 19 mmol/L — ABNORMAL LOW (ref 22–32)
Chloride: 107 mmol/L (ref 101–111)
Creatinine, Ser: 0.93 mg/dL (ref 0.61–1.24)
GFR calc Af Amer: >60 mL/min (ref >60)
GLUCOSE: 84 mg/dL (ref 65–99)
Potassium: 4.3 mmol/L (ref 3.5–5.1)
Sodium: 140 mmol/L (ref 135–145)
Total Protein: 7.4 g/dL (ref 6.5–8.1)

## 2018-02-02 LAB — CBC
HEMATOCRIT: 41.6 % (ref 39.0–52.0)
Hemoglobin: 13.5 g/dL (ref 13.0–17.0)
MCH: 28 pg (ref 26.0–34.0)
MCHC: 32.5 g/dL (ref 30.0–36.0)
MCV: 86.3 fL (ref 78.0–100.0)
PLATELETS: 305 10*3/uL (ref 150–400)
RBC: 4.82 MIL/uL (ref 4.22–5.81)
RDW: 13.4 % (ref 11.5–15.5)
WBC: 4.2 10*3/uL (ref 4.0–10.5)

## 2018-02-02 LAB — ACETAMINOPHEN LEVEL: Acetaminophen (Tylenol), Serum: <10 ug/mL — ABNORMAL LOW (ref 10–30)

## 2018-02-02 LAB — RAPID URINE DRUG SCREEN, HOSP PERFORMED
Amphetamines: NOT DETECTED
BARBITURATES: NOT DETECTED
BENZODIAZEPINES: NOT DETECTED
Cocaine: NOT DETECTED
Opiates: NOT DETECTED
Tetrahydrocannabinol: POSITIVE — AB

## 2018-02-02 LAB — SALICYLATE LEVEL: Salicylate Lvl: <7 mg/dL (ref 2.8–30.0)

## 2018-02-02 LAB — ETHANOL: ALCOHOL ETHYL (B): 13 mg/dL — AB (ref <10)

## 2018-02-02 MED ORDER — VITAMIN B-1 100 MG PO TABS
100.0000 mg | ORAL_TABLET | Freq: Every day | ORAL | Status: DC
  Administered 2018-02-02: 100 mg via ORAL

## 2018-02-02 MED ORDER — THIAMINE HCL 100 MG/ML IJ SOLN: 100.0000 mg | Freq: Every day | INTRAMUSCULAR | Status: DC

## 2018-02-02 MED ORDER — LORAZEPAM 1 MG PO TABS: 0.0000 mg | ORAL_TABLET | Freq: Two times a day (BID) | ORAL | Status: DC

## 2018-02-02 MED ORDER — LORAZEPAM 1 MG PO TABS: 0.0000 mg | ORAL_TABLET | Freq: Four times a day (QID) | ORAL | Status: DC

## 2018-02-02 MED ORDER — LORAZEPAM 2 MG/ML IJ SOLN: 0.0000 mg | Freq: Four times a day (QID) | INTRAMUSCULAR | Status: DC

## 2018-02-02 MED ORDER — ACETAMINOPHEN 325 MG PO TABS: 650.0000 mg | ORAL_TABLET | ORAL | Status: DC | PRN

## 2018-02-02 MED ORDER — LORAZEPAM 2 MG/ML IJ SOLN: 0.0000 mg | Freq: Two times a day (BID) | INTRAMUSCULAR | Status: DC

## 2018-02-02 NOTE — ED Triage Notes (Signed)
Pt to ER for evaluation of SI secondary to auditory hallucinations worsened today, states are telling him to kill himself. Reports ran out of medications a few weeks ago. Pt in NAD. Calm, cooperative.

## 2018-02-02 NOTE — ED Notes (Signed)
[facility] at [employer] eating lunch tray. Sitter remains at the bedside.

## 2018-02-02 NOTE — BH Assessment (Signed)
Assessment Note  Cole Barrett is a 32 y.o. single male who [facility] with report of hearing loud voices in his head. Pt denies SI and HI, both currently and in past.  Pt is oriented x 4. He was pleasant and cooperative. He denies sx of depression and current stressors. He denies financial and relationship problems. Pt states he lives alone in a house and he will be able to return. Pt reports he keeps to himself mostly and has had no changes in sleep or appetite. Pt's primary complaint is frustration with the loud voices in his head. He states he did not f/u with outpt tx after [facility] of [facility].  Diagnosis: F20.9 Schizophrenia   Disposition: Per Elta GuadeloupeLaurie Parks, NP, pt is to follow up at [facility] for outpt tx.    Past Medical History: History reviewed. No pertinent past medical history.  History reviewed. No pertinent surgical history.  Family History: History reviewed. No pertinent family history.  Social History:  reports that he has been smoking.  he has never used smokeless tobacco. He reports that he drinks alcohol. He reports that he uses drugs. Drug: Marijuana.  Additional Social History:  Alcohol / Drug Use Pain Medications: see MAR Prescriptions: see MAR Over the Counter: see MAr  CIWA: CIWA-Ar BP: 131/67 Pulse Rate: 90 Nausea and Vomiting: no nausea and no vomiting Tactile Disturbances: none Tremor: no tremor Auditory Disturbances: not present(pt denies) Paroxysmal Sweats: no sweat visible Visual Disturbances: not present Anxiety: no anxiety, at ease Headache, Fullness in Head: none present Agitation: normal activity Orientation and Clouding of Sensorium: oriented and can do serial additions CIWA-Ar Total: 0 COWS:    Allergies: Not on File  Home Medications:  (Not in a hospital admission)  OB/GYN Status:  No LMP for male patient.  General Assessment Data [facility] T[facility] Assessment: In system Is this a Tele  or Face-to-Face Assessment?: Tele Assessment Is this an Initial Assessment or a Re-assessment for this encounter?: Initial Assessment Marital status: Single Living Arrangements: Alone Can pt return to current living arrangement?: Yes Admission Status: Voluntary Is patient capable of signing voluntary admission?: Yes Referral Source: Self/Family/Friend     Crisis Care Plan Living Arrangements: Alone Name of Psychiatrist: denies Name of Therapist: denies  Education Status Is patient currently in school?: No Is the patient employed, unemployed or receiving disability?: Unemployed  Risk to self with the past 6 months Suicidal Ideation: No Has patient been a risk to self within the past 6 months prior to admission? : No Suicidal Intent: No Has patient had any suicidal intent within the past 6 months prior to admission? : No Is patient at risk for suicide?: No Suicidal Plan?: No Has patient had any suicidal plan within the past 6 months prior to admission? : No What has been your use of drugs/alcohol within the last 12 months?: etoh couple x weekly; pot 1 q other month Previous Attempts/Gestures: No Other Self Harm Risks: denies Intentional Self Injurious Behavior: None Family Suicide History: No Recent stressful life event(s): (denies) Persecutory voices/beliefs?: Yes Depression: No Depression Symptoms: (denies sx) Substance abuse history and/or treatment for substance abuse?: Yes Suicide prevention information given to non-admitted patients: Not applicable  Risk to Others within the past 6 months Homicidal Ideation: No Does patient have any lifetime risk of violence toward others beyond the six months prior to admission? : No Thoughts of Harm to Others: No Current Homicidal Intent: No Current Homicidal  Plan: No History of harm to others?: No Criminal Charges Pending?: No Does patient have a court date: No Is patient on probation?: No  Psychosis Hallucinations:  Auditory Delusions: None noted  Mental Status Report Appearance/Hygiene: Unremarkable, In scrubs Eye Contact: Good Motor Activity: Freedom of movement Speech: Loud, Logical/coherent Level of Consciousness: Alert Mood: Irritable Affect: Appropriate to circumstance, Constricted Anxiety Level: Minimal Thought Processes: Coherent, Relevant Judgement: Partial Orientation: Person, Place, Time, Situation Obsessive Compulsive Thoughts/Behaviors: None  Cognitive Functioning Concentration: Normal Memory: Recent Intact, Remote Intact Impulse Control: Good Appetite: Good Sleep: No Change Total Hours of Sleep: 8 Vegetative Symptoms: None  ADLScreening [facility] Assessment Services) Patient's cognitive ability adequate to safely complete daily activities?: Yes Patient able to express need for assistance with ADLs?: Yes Independently performs ADLs?: Yes (appropriate for developmental age)  Prior Inpatient Therapy Prior Inpatient Therapy: Yes([facility]) Prior Therapy Dates: 2018     ADL Screening (condition at time of admission) Patient's cognitive ability adequate to safely complete daily activities?: Yes Is the patient deaf or have difficulty hearing?: No Does the patient have difficulty seeing, even when wearing glasses/contacts?: No Does the patient have difficulty concentrating, remembering, or making decisions?: No Patient able to express need for assistance with ADLs?: Yes Does the patient have difficulty dressing or bathing?: No Independently performs ADLs?: Yes (appropriate for developmental age) Does the patient have difficulty walking or climbing stairs?: No Weakness of Legs: None Weakness of Arms/Hands: None  Home Assistive Devices/Equipment Home Assistive Devices/Equipment: None  Therapy Consults (therapy consults require a physician order) PT Evaluation Needed: No OT Evalulation Needed: No SLP Evaluation Needed: No Abuse/Neglect Assessment (Assessment to be complete while  patient is alone) Abuse/Neglect Assessment Can Be Completed: Yes Physical Abuse: Denies Verbal Abuse: Denies Sexual Abuse: Denies Exploitation of patient/patient's resources: Denies Self-Neglect: Denies Values / Beliefs Cultural Requests During Hospitalization: None Spiritual Requests During Hospitalization: None Consults Spiritual Care Consult Needed: No Social Work Consult Needed: No Merchant navy officer (For Healthcare) Does Patient Have a Medical Advance Directive?: No Would patient like information on creating a medical advance directive?: No - Patient declined    Additional Information 1:1 In Past 12 Months?: No CIRT Risk: No Elopement Risk: No Does patient have medical clearance?: No     Disposition:  Disposition Initial Assessment Completed for this Encounter: Yes Disposition of Patient: Discharge  On Site Evaluation by:   Reviewed with Physician:    Clearnce Sorrel 02/02/2018 1:59 PM

## 2018-02-02 NOTE — ED Notes (Signed)
ED Provider at bedside. 

## 2018-02-02 NOTE — ED Notes (Addendum)
Patient was given his own Soap and Deoderant to take a shower before leaving, Patient stated that our soap make him itch.

## 2018-02-02 NOTE — Progress Notes (Signed)
Per Elta GuadeloupeLaurie Parks, NP, client is psych cleared for discharge. [facility] RN has been notified. Pt is reportedly familiar with [facility] outpatient services and does not need referral information at this time.   Wells GuilesSarah Michel Hendon, LCSW, LCAS Disposition CSW

## 2018-02-02 NOTE — ED Notes (Signed)
Belongings inventoried and verified with patient. Belongings placed in [employer] # 6

## 2018-02-02 NOTE — ED Notes (Signed)
Sitter at the bedside.

## 2018-02-02 NOTE — ED Notes (Signed)
TTS in process 

## 2018-02-02 NOTE — ED Provider Notes (Addendum)
[name] EM[facility]GENC[facility]PART[facility] Provider Note   CSN: [id number] Arrival date & time: 02/02/18  [id number]     History   Chief Complaint Chief Complaint  Patient presents with  . Suicidal    HPI  Cole Barrett is a 32 y.o. Male with a history of mental illness, reports to the ED for evaluation of SI secondary to auditory hallucinations.  Patient reports he has been hearing voices probably for 2 or 3 years but over the last few days the auditory hallucinations have worsened and the voices are now telling him to kill himself or that they are going to kill him.  Patient reports the command hallucinations started today when he got up and was getting ready to drive to work and so he came to the hospital to try to get in to behavioral health for some help.  Patient reports initially voices were concerning him and making him feel a little bit suicidal, he now denies this or any plan he denies HI and he denies any visual hallucinations.  Patient reports a few months ago he got some medications [facility] he is unsure the name of these medications but ran out of them a few weeks ago, has not returned to [facility] for follow-up or refill.  Patient reports he drank a few beers last night but reports he does not drink on a daily basis he reports cigarettes and marijuana use, but denies any other substances.  Denies any ingestions or attempts at self-harm prior to arrival today.  No focal medical complaints, denies fevers or chills URI symptoms cough, chest pain, shortness of breath, abdominal pain, N/V/D.      History reviewed. No pertinent past medical history.  There are no active problems to display for this patient.   History reviewed. No pertinent surgical history.     Home Medications    Prior to Admission medications   Not on File    Family History History reviewed. No pertinent family history.  Social History Social History   Tobacco Use  . Smoking status:  Current Every Day Smoker  . Smokeless tobacco: Never Used  Substance Use Topics  . Alcohol use: Yes  . Drug use: Yes    Types: Marijuana     Allergies   Patient has no allergy information on record.   Review of Systems Review of Systems  Constitutional: Negative for chills and fever.  HENT: Negative for congestion, rhinorrhea and sore throat.   Eyes: Negative for visual disturbance.  Respiratory: Negative for cough and shortness of breath.   Cardiovascular: Negative for chest pain.  Gastrointestinal: Negative for abdominal pain, diarrhea, nausea and vomiting.  Genitourinary: Negative for dysuria.  Musculoskeletal: Negative for arthralgias and myalgias.  Neurological: Positive for headaches. Negative for weakness and numbness.  Psychiatric/Behavioral: Positive for dysphoric mood and hallucinations. Negative for agitation, behavioral problems, confusion and self-injury. The patient is not nervous/anxious.      Physical Exam Updated Vital Signs BP 131/67   Pulse 90   Temp 98.2 F (36.8 C) (Oral)   Resp 18   Ht 5\' 10"  (1.778 m)   Wt 99.8 kg (220 lb)   SpO2 100%   BMI 31.57 kg/m   Physical Exam  Constitutional: He is oriented to person, place, and time. He appears well-developed and well-nourished. No distress.  HENT:  Head: Normocephalic and atraumatic.  Mouth/Throat: Oropharynx is clear and moist.  Eyes: EOM are normal. Pupils are equal, round, and reactive to light. Right  eye exhibits no discharge. Left eye exhibits no discharge.  Cardiovascular: Normal rate, regular rhythm and normal heart sounds.  Pulmonary/Chest: Effort normal and breath sounds normal. No stridor. No respiratory distress. He has no wheezes. He has no rales.  Abdominal: Soft. Bowel sounds are normal. He exhibits no distension and no mass. There is no tenderness. There is no guarding.  Musculoskeletal: He exhibits no edema or deformity.  Neurological: He is alert and oriented to person, place, and  time. Coordination normal.  Patient alert and oriented, following commands.  Moving all extremities without difficulty  Skin: Skin is warm and dry. Capillary refill takes less than 2 seconds. He is not diaphoretic.  Psychiatric: He has a normal mood and affect. His behavior is normal.  Nursing note and vitals reviewed.    ED Treatments / Results  Labs (all labs ordered are listed, but only abnormal results are displayed) Labs Reviewed  COMPREHENSIVE METABOLIC PANEL - Abnormal; Notable for the following components:      Result Value   CO2 19 (*)    All other components within normal limits  ETHANOL - Abnormal; Notable for the following components:   Alcohol, Ethyl (B) 13 (*)    All other components within normal limits  ACETAMINOPHEN LEVEL - Abnormal; Notable for the following components:   Acetaminophen (Tylenol), Serum <10 (*)    All other components within normal limits  RAPID URINE DRUG SCREEN, HOSP PERFORMED - Abnormal; Notable for the following components:   Tetrahydrocannabinol POSITIVE (*)    All other components within normal limits  SALICYLATE LEVEL  CBC    EKG  EKG Interpretation None       Radiology No results found.  Procedures Procedures (including critical care time)  Medications Ordered in ED Medications  LORazepam (ATIVAN) injection 0-4 mg (0 mg Intravenous Not Given 02/02/18 1300)    Or  LORazepam (ATIVAN) tablet 0-4 mg ( Oral See Alternative 02/02/18 1300)  LORazepam (ATIVAN) injection 0-4 mg (not administered)    Or  LORazepam (ATIVAN) tablet 0-4 mg (not administered)  thiamine (VITAMIN B-1) tablet 100 mg (not administered)    Or  thiamine (B-1) injection 100 mg (not administered)  acetaminophen (TYLENOL) tablet 650 mg (not administered)     Initial Impression / Assessment and Plan / ED Course  I have reviewed the triage vital signs and the nursing notes.  Pertinent labs & imaging results that were available during my care of the patient were  reviewed by me and considered in my medical decision making (see chart for details).  Presents to the ED for evaluation of command auditory hallucinations telling him that they are going to kill him or he should kill himself.  He reports this gave him some passive suicidal ideations earlier today but he denies plan, he denies HI or visual hallucinations.  Did drink alcohol last night, and uses cigarettes and marijuana, denies daily alcohol use has never gone through withdrawals before.  Ran out of medications a few weeks ago has not followed up with [facility].  No focal medical complaints, normal vitals and patient is overall well-appearing on exam.  Screening labs obtained, no leukocytosis, normal hemoglobin, no electrolyte derangements requiring intervention, normal kidney and liver function, ethanol level is 13 patient does endorse drinking alcohol last night, he is not exhibiting any signs of alcohol withdrawal.  Acetaminophen and salicylate levels negative, UDS is positive for marijuana, negative for all other substances.  At this time patient is medically cleared for TTS evaluation,  consult placed.  Patient placed under ED psych hold with [place] protocol in place.  No home medications to restart.  Awaiting TTS recommendations for appropriate disposition  2:26 PM TTS evaluated patient, they feel he is safe for discharge with outpatient follow-up at [facility], he is not a danger to himself or others these voices have been present for several years and they feel he does not meet criteria for inpatient treatment at this time.  Patient will be discharged with [facility] referral and resources.  Patient has been told multiple times that until he is compliant with his medications these voices are not going to go away.   Final Clinical Impressions(s) / ED Diagnoses   Final diagnoses:  Auditory hallucinations    ED Discharge Orders    None       Dartha Lodge, [place] 02/02/18 1312    Dartha Lodge,  PA-C 02/02/18 1432    Charlynne Pander, MD 02/05/18 [id number]

## 2018-02-02 NOTE — ED Notes (Signed)
Pt belongings in [employer] locker storage number 6. Stickers are on outside of door.

## 2018-02-02 NOTE — Discharge Instructions (Signed)
Psychiatry feels you are stable for outpatient follow-up, [facility] has walk-in hours You can go there today, they do not close until 5 PM. It is very important that you get restarted on your medication and take it regularly. You need to follow-up with the [facility] in order to continue to obtain prescriptions for your medication. Please return to the ED of you have thoughts of harming yourself or others, or other new or concerning symptoms occur.

## 2018-02-02 NOTE — ED Notes (Signed)
Pt verbalized understanding of discharge instructions and importance of follow up at [facility]. Pt presented with bus pass and is currently changing into his own clothes. Pt will then be shown the way to the ED lobby.

## 2018-02-05 ENCOUNTER — Encounter (HOSPITAL_COMMUNITY): Payer: Self-pay | Admitting: Emergency Medicine

## 2018-02-09 ENCOUNTER — Other Ambulatory Visit: Payer: Self-pay

## 2018-02-09 ENCOUNTER — Encounter (HOSPITAL_COMMUNITY): Payer: Self-pay | Admitting: Emergency Medicine

## 2018-02-09 ENCOUNTER — Emergency Department (HOSPITAL_COMMUNITY)
Admission: EM | Admit: 2018-02-09 | Discharge: 2018-02-11 | Disposition: A | Discharge status: Home / Self Care | Payer: Self-pay | Attending: Emergency Medicine | Admitting: Emergency Medicine

## 2018-02-09 DIAGNOSIS — F250 Schizoaffective disorder, bipolar type: Secondary | ICD-10-CM | POA: Insufficient documentation

## 2018-02-09 DIAGNOSIS — R440 Auditory hallucinations: Secondary | ICD-10-CM

## 2018-02-09 DIAGNOSIS — Z79899 Other long term (current) drug therapy: Secondary | ICD-10-CM | POA: Insufficient documentation

## 2018-02-09 DIAGNOSIS — Z590 Homelessness: Secondary | ICD-10-CM | POA: Insufficient documentation

## 2018-02-09 DIAGNOSIS — F17210 Nicotine dependence, cigarettes, uncomplicated: Secondary | ICD-10-CM | POA: Insufficient documentation

## 2018-02-09 LAB — CBC
HCT: 39.2 % (ref 39.0–52.0)
HEMOGLOBIN: 12.8 g/dL — AB (ref 13.0–17.0)
MCH: 28.1 pg (ref 26.0–34.0)
MCHC: 32.7 g/dL (ref 30.0–36.0)
MCV: 86 fL (ref 78.0–100.0)
Platelets: 325 10*3/uL (ref 150–400)
RBC: 4.56 MIL/uL (ref 4.22–5.81)
RDW: 13.6 % (ref 11.5–15.5)
WBC: 5.6 10*3/uL (ref 4.0–10.5)

## 2018-02-09 LAB — RAPID URINE DRUG SCREEN, HOSP PERFORMED
AMPHETAMINES: NOT DETECTED
BENZODIAZEPINES: NOT DETECTED
Barbiturates: NOT DETECTED
COCAINE: NOT DETECTED
Opiates: NOT DETECTED
Tetrahydrocannabinol: NOT DETECTED

## 2018-02-09 LAB — COMPREHENSIVE METABOLIC PANEL
ALT: 32 U/L (ref 17–63)
ANION GAP: 13 (ref 5–15)
AST: 25 U/L (ref 15–41)
Albumin: 3.7 g/dL (ref 3.5–5.0)
Alkaline Phosphatase: 50 U/L (ref 38–126)
BUN: 11 mg/dL (ref 6–20)
CHLORIDE: 104 mmol/L (ref 101–111)
CO2: 20 mmol/L — ABNORMAL LOW (ref 22–32)
Calcium: 8.5 mg/dL — ABNORMAL LOW (ref 8.9–10.3)
Creatinine, Ser: 1.07 mg/dL (ref 0.61–1.24)
Glucose, Bld: 109 mg/dL — ABNORMAL HIGH (ref 65–99)
POTASSIUM: 3.2 mmol/L — AB (ref 3.5–5.1)
SODIUM: 137 mmol/L (ref 135–145)
Total Bilirubin: 0.5 mg/dL (ref 0.3–1.2)
Total Protein: 7 g/dL (ref 6.5–8.1)

## 2018-02-09 LAB — ETHANOL: ALCOHOL ETHYL (B): 53 mg/dL — AB (ref <10)

## 2018-02-09 MED ORDER — LORAZEPAM 2 MG/ML IJ SOLN: 0.0000 mg | Freq: Four times a day (QID) | INTRAMUSCULAR | Status: DC

## 2018-02-09 MED ORDER — TRAZODONE HCL 50 MG PO TABS
50.0000 mg | ORAL_TABLET | Freq: Every evening | ORAL | Status: DC | PRN
  Administered 2018-02-09: 50 mg via ORAL
  Filled 2018-02-09: qty 1

## 2018-02-09 MED ORDER — RISPERIDONE 1 MG PO TABS
1.0000 mg | ORAL_TABLET | Freq: Two times a day (BID) | ORAL | Status: DC
  Administered 2018-02-09 – 2018-02-11 (×4): 1 mg via ORAL
  Filled 2018-02-09 (×4): qty 1

## 2018-02-09 MED ORDER — LORAZEPAM 2 MG/ML IJ SOLN: 0.0000 mg | Freq: Two times a day (BID) | INTRAMUSCULAR | Status: DC

## 2018-02-09 MED ORDER — LORAZEPAM 1 MG PO TABS: 0.0000 mg | ORAL_TABLET | Freq: Two times a day (BID) | ORAL | Status: DC

## 2018-02-09 MED ORDER — BENZTROPINE MESYLATE 1 MG PO TABS
0.5000 mg | ORAL_TABLET | Freq: Every day | ORAL | Status: DC
Start: 2018-02-09 — End: 2018-02-11
  Administered 2018-02-09 – 2018-02-10 (×2): 0.5 mg via ORAL
  Filled 2018-02-09 (×2): qty 1

## 2018-02-09 MED ORDER — VITAMIN B-1 100 MG PO TABS
100.0000 mg | ORAL_TABLET | Freq: Every day | ORAL | Status: DC
  Administered 2018-02-09 – 2018-02-11 (×3): 100 mg via ORAL
  Filled 2018-02-09 (×3): qty 1

## 2018-02-09 MED ORDER — ACETAMINOPHEN 325 MG PO TABS
650.0000 mg | ORAL_TABLET | ORAL | Status: DC | PRN
Start: 2018-02-09 — End: 2018-02-11

## 2018-02-09 MED ORDER — POTASSIUM CHLORIDE CRYS ER 20 MEQ PO TBCR
40.0000 meq | EXTENDED_RELEASE_TABLET | Freq: Once | ORAL | Status: AC
  Administered 2018-02-09: 40 meq via ORAL
  Filled 2018-02-09: qty 2

## 2018-02-09 MED ORDER — FLUOXETINE HCL 20 MG PO CAPS
20.0000 mg | ORAL_CAPSULE | Freq: Every day | ORAL | Status: DC
  Administered 2018-02-09 – 2018-02-11 (×3): 20 mg via ORAL
  Filled 2018-02-09 (×3): qty 1

## 2018-02-09 MED ORDER — HYDROXYZINE HCL 25 MG PO TABS
25.0000 mg | ORAL_TABLET | Freq: Three times a day (TID) | ORAL | Status: DC | PRN
  Administered 2018-02-09: 25 mg via ORAL
  Filled 2018-02-09: qty 1

## 2018-02-09 MED ORDER — LORAZEPAM 1 MG PO TABS: 0.0000 mg | ORAL_TABLET | Freq: Four times a day (QID) | ORAL | Status: DC

## 2018-02-09 MED ORDER — THIAMINE HCL 100 MG/ML IJ SOLN: 100.0000 mg | Freq: Every day | INTRAMUSCULAR | Status: DC

## 2018-02-09 NOTE — ED Provider Notes (Signed)
[name] EMERGENCY DEPARTMENT Provider Note   CSN: [id number] Arrival date & time: 02/09/18  0349     History   Chief Complaint Chief Complaint  Patient presents with  . Hallucinations    HPI Cole Barrett is a 32 y.o. male with history of homelessness, hypercholesterolemia, paranoid behavior, schizophrenia presents today for evaluation of acute onset, constant auditory hallucinations.  He states that a few hours prior to arrival he began hearing voices that were telling him to kill himself.  He denies any suicidal ideation or homicidal ideation.  He states that he does hear voices on occasion.  Denies visual hallucinations.  He states that last night he drank two beers but denies any other recreational drug use.  Denies any medical complaints at this time.  The history is provided by the patient.    Past Medical History:  Diagnosis Date  . Homelessness   . Hypercholesterolemia   . Mental disorder   . Paranoid behavior (HCC)   . PE (pulmonary embolism)   . Schizophrenia [facility])     Patient Active Problem List   Diagnosis Date Noted  . Schizophrenia (HCC) 12/24/2017  . 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 Schizophrenia, paranoid (HCC)   . 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 MDD (major depressive disorder) 09/22/2017  . Alcohol abuse   . Auditory hallucinations   . Alcohol abuse with alcohol-induced mood disorder (HCC) 09/08/2016  . Adjustment disorder with depressed mood 09/07/2016  . Homelessness 09/03/2016  . Person feigning illness 09/03/2016  . 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 Cannabis use disorder, mild, abuse 08/29/2016  . Pulmonary emboli (HCC) 07/13/2016  . 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 Acute left flank pain 01/02/2014  . Pulmonary embolism, bilateral (HCC) 01/02/2014    History reviewed. No pertinent  surgical history.     Home Medications    Prior to Admission medications   Medication Sig Start Date End Date Taking? Authorizing Provider  benztropine (COGENTIN) 0.5 MG tablet Take 1 tablet (0.5 mg total) by mouth at bedtime. For prevention of drug induced tremors Patient not taking: Reported on 01/13/2018 12/29/17   Muthersbaugh, Dahlia Client, PA-C  FLUoxetine (PROZAC) 20 MG capsule Take 1 capsule (20 mg total) by mouth daily. For depression Patient not taking: Reported on 01/13/2018 12/29/17   Muthersbaugh, Dahlia Client, PA-C  hydrOXYzine (ATARAX/VISTARIL) 25 MG tablet Take 1 tablet (25 mg total) by mouth 3 (three) times daily as needed for anxiety. Patient not taking: Reported on 01/13/2018 12/29/17   Muthersbaugh, Dahlia Client, PA-C  risperiDONE (RISPERDAL) 2 MG tablet Take 1 tablet (2 mg total) by mouth 2 (two) times daily. For mood control Patient not taking: Reported on 01/13/2018 12/29/17   Muthersbaugh, Dahlia Client, PA-C  traZODone (DESYREL) 50 MG tablet Take 1 tablet (50 mg total) by mouth at bedtime as needed for sleep. Patient not taking: Reported on 01/13/2018 12/29/17   Muthersbaugh, Dahlia Client, PA-C    Family History Family History  Problem Relation Age of Onset  . Hypertension Mother     Social History Social History   Tobacco Use  . Smoking status: Current Every Day Smoker    Packs/day: 0.50    Types: Cigarettes  . Smokeless tobacco: Never Used  Substance Use Topics  . Alcohol use: Yes    Comment: socially  . Drug use: Yes    Types: Marijuana    Comment: Once every 2-3 months, Last used: Last  month     Allergies   Haldol [haloperidol]   Review of Systems Review of Systems  Constitutional: Negative for chills and fever.  Psychiatric/Behavioral: Positive for hallucinations. Negative for self-injury and suicidal ideas.  All other systems reviewed and are negative.    Physical Exam Updated Vital Signs BP 132/69 (BP Location: Right Arm)   Pulse 86   Temp 98 F (36.7 C) (Oral)   Resp  17   SpO2 96%   Physical Exam  Constitutional: He appears well-developed and well-nourished. No distress.  HENT:  Head: Normocephalic and atraumatic.  Eyes: Conjunctivae are normal. Right eye exhibits no discharge. Left eye exhibits no discharge.  Neck: No JVD present. No tracheal deviation present.  Cardiovascular: Normal rate, regular rhythm and normal heart sounds.  Pulmonary/Chest: Effort normal and breath sounds normal. No stridor. No respiratory distress. He has no wheezes. He has no rales. He exhibits no tenderness.  Abdominal: Soft. Bowel sounds are normal. He exhibits no distension. There is no tenderness. There is no guarding.  Musculoskeletal: He exhibits no edema.  Neurological: He is alert.  Skin: Skin is warm and dry. No erythema.  Psychiatric: His speech is normal. His affect is blunt. He is withdrawn and actively hallucinating. Thought content is paranoid. He expresses no homicidal and no suicidal ideation. He expresses no suicidal plans and no homicidal plans.  Nursing note and vitals reviewed.    ED Treatments / Results  Labs (all labs ordered are listed, but only abnormal results are displayed) Labs Reviewed  COMPREHENSIVE METABOLIC PANEL - Abnormal; Notable for the following components:      Result Value   Potassium 3.2 (*)    CO2 20 (*)    Glucose, Bld 109 (*)    Calcium 8.5 (*)    All other components within normal limits  ETHANOL - Abnormal; Notable for the following components:   Alcohol, Ethyl (B) 53 (*)    All other components within normal limits  CBC - Abnormal; Notable for the following components:   Hemoglobin 12.8 (*)    All other components within normal limits  RAPID URINE DRUG SCREEN, HOSP PERFORMED    EKG  EKG Interpretation None       Radiology No results found.  Procedures Procedures (including critical care time)  Medications Ordered in ED Medications  potassium chloride SA (K-DUR,KLOR-CON) CR tablet 40 mEq (not  administered)     Initial Impression / Assessment and Plan / ED Course  I have reviewed the triage vital signs and the nursing notes.  Pertinent labs & imaging results that were available during my care of the patient were reviewed by me and considered in my medical decision making (see chart for details).     Patient presents for evaluation of auditory hallucinations.  History of schizophrenia.  He is sleeping in chair comfortably, easily arousable.  Answers questions appropriately.  Vital signs are stable.  He is very mildly hypokalemic, corrected in the ED. he is positive for EtOH but is clinically sober.  Remainder of lab work and physical examination are reassuring.  He is medically cleared for TTS evaluation at this time.   Spoke with Vikki Ports with TTS, recommendation for overnight observation and a.m. psych evaluation.  They also recommend restarting his psych medications.  Patient placed in a psych hold with medications ordered. Final Clinical Impressions(s) / ED Diagnoses   Final diagnoses:  Auditory hallucinations    ED Discharge Orders    None  Jeanie SewerFawze, [place] Mcphee A, PA-C 02/09/18 1618    Gerhard MunchLockwood, Robert, MD 02/09/18 2215

## 2018-02-09 NOTE — ED Notes (Signed)
TTS completed. 

## 2018-02-09 NOTE — ED Triage Notes (Signed)
Patient states that he is hearing voices that are going to kill him.  He denies SI/HI at this time.  Patient is calm, cooperative, falling asleep during triage.

## 2018-02-09 NOTE — BH Assessment (Signed)
Pt is in the hallway. TTS will attempt again in 10 minutes

## 2018-02-09 NOTE — ED Notes (Signed)
TTS called stated ready for consult. Will place patient in room for consult.

## 2018-02-09 NOTE — ED Notes (Signed)
Gave patient Mal[place]key sandwich meal and sprite in a cup.

## 2018-02-09 NOTE — ED Notes (Signed)
[facility]  Belongings inventoried and placed in [place]. Report given to [facility].

## 2018-02-09 NOTE — BH Assessment (Addendum)
Tele Assessment Note   Patient Name: Cole Barrett MRN: [id number] Referring Physician: Michela Pitcher, PA-C Location of Patient: [facility] Location of Provider: Behavioral Health TTS Department  Cole Barrett is an 32 y.o. male who presents voluntarily alone reporting auditory hallucinations. Pt has a history of schizophrenia and hallucinations. Pt reports medications are not working.  Pt denies current suicidal ideation and denies having a plan. Pt denies past attempts. Pt denies symptoms of depression and anxiety.  Pt denies homicidal ideation/ history of violence. Pt reports auditory hallucinations and denies visual hallucinations or other psychotic symptoms. Pt states current stressors includes hearing the voices.   Pt is homeless and denies having any supports. Pt denies history of abuse and trauma. Pt denies family history of SI/MH/AVH. Pt's work history includes current employment at [name]. Pt has fair insight and partial judgment. Pt's memory is intact.  Legal history includes pending charges and an upcoming court date for trespassing.  Pt doesn't remember the court date.  Pt's OP history includes receiving services in the past at [facility]. IP history includes an admission to [facility] in January 2019.  Pt denies alcohol/ substance abuse.  Pt is dressed in scrubs, alert, oriented x4 with normal speech and normal motor behavior. Eye contact is good. Pt's mood is irritable and affect is irritable. Affect is congruent with mood. Thought process is coherent and relevant. There is no indication pt is currently responding to internal stimuli or experiencing delusional thought content. Pt was cooperative throughout assessment. Pt is currently able to contract for safety outside the hospital and wants inpatient psychiatric treatment.  Diagnosis: F25.0 Schizoaffective disorder, Bipolar type  Past Medical History:  Past Medical History:  Diagnosis Date  . Homelessness   .  Hypercholesterolemia   . Mental disorder   . Paranoid behavior (HCC)   . PE (pulmonary embolism)   . Schizophrenia (HCC)     History reviewed. No pertinent surgical history.  Family History:  Family History  Problem Relation Age of Onset  . Hypertension Mother     Social History:  reports that he has been smoking cigarettes.  He has been smoking about 0.50 packs per day. he has never used smokeless tobacco. He reports that he drinks alcohol. He reports that he uses drugs. Drug: Marijuana.  Additional Social History:  Alcohol / Drug Use Pain Medications: See MAR Prescriptions: See MAR Over the Counter: See MAR History of alcohol / drug use?: No history of alcohol / drug abuse(Pt denies) Longest period of sobriety (when/how long): UTA  CIWA: CIWA-Ar BP: 134/82 Pulse Rate: 72 COWS:    Allergies:  Allergies  Allergen Reactions  . Haldol [Haloperidol] Shortness Of Breath    Home Medications:  (Not in a hospital admission)  OB/GYN Status:  No LMP for male patient.  General Assessment Data Assessment unable to be completed: Yes Reason for not completing assessment: Pt is in the hallway, will try again Location of Assessment: [facility] ED TTS Assessment: In system Is this a Tele or Face-to-Face Assessment?: Tele Assessment Is this an Initial Assessment or a Re-assessment for this encounter?: Initial Assessment Marital status: Single Maiden name: NA Is patient pregnant?: No Pregnancy Status: No Living Arrangements: Alone, Other (Comment)(Homless) Can pt return to current living arrangement?: Yes Admission Status: Voluntary Is patient capable of signing voluntary admission?: Yes Referral Source: Self/Family/Friend Insurance type: Self pay     Crisis Care Plan Living Arrangements: Alone, Other (Comment)(Homless) Name of Psychiatrist: Pt denies Name of Therapist: Pt  denies  Education Status Is patient currently in school?: No Current Grade: NA Highest grade of school  patient has completed: 12th grade Name of school: NA Contact person: NA Is the patient employed, unemployed or receiving disability?: Employed([employer])  Risk to self with the past 6 months Suicidal Ideation: No Has patient been a risk to self within the past 6 months prior to admission? : No Suicidal Intent: No Has patient had any suicidal intent within the past 6 months prior to admission? : No Is patient at risk for suicide?: No Suicidal Plan?: No Has patient had any suicidal plan within the past 6 months prior to admission? : No Access to Means: No What has been your use of drugs/alcohol within the last 12 months?: Pt denies Previous Attempts/Gestures: No How many times?: 0 Other Self Harm Risks: Pt denies Triggers for Past Attempts: None known Intentional Self Injurious Behavior: None Family Suicide History: No Recent stressful life event(s): Other (Comment)(Pt denies) Persecutory voices/beliefs?: Yes Depression: No Depression Symptoms: (Pt denies) Substance abuse history and/or treatment for substance abuse?: No Suicide prevention information given to non-admitted patients: Not applicable  Risk to Others within the past 6 months Homicidal Ideation: No Does patient have any lifetime risk of violence toward others beyond the six months prior to admission? : No Thoughts of Harm to Others: No Current Homicidal Intent: No Current Homicidal Plan: No Access to Homicidal Means: No Identified Victim: Pt denies History of harm to others?: No Assessment of Violence: None Noted Violent Behavior Description: Pt denies Does patient have access to weapons?: No Criminal Charges Pending?: Yes Describe Pending Criminal Charges: Trespassing Does patient have a court date: Yes Court Date: (Pt doesn't know) Is patient on probation?: No  Psychosis Hallucinations: Auditory(Pt states the voices are trying to kill me) Delusions: None noted  Mental Status  Report Appearance/Hygiene: In scrubs Eye Contact: Good Motor Activity: Freedom of movement, Restlessness Speech: Logical/coherent Level of Consciousness: Alert Mood: Irritable Affect: Irritable Anxiety Level: None Thought Processes: Coherent, Relevant Judgement: Partial Orientation: Person, Place, Time, Situation, Appropriate for developmental age Obsessive Compulsive Thoughts/Behaviors: None  Cognitive Functioning Concentration: Normal Memory: Recent Intact, Remote Intact Is patient IDD: No Level of Function: NA Is patient DD?: No Insight: Fair Impulse Control: Fair Appetite: Good Have you had any weight changes? : Gain Amount of the weight change? (lbs): (Pt states he doesn't know) Sleep: Unable to Assess(Pt states a couple hours) Total Hours of Sleep: (Pt states a couple hours) Vegetative Symptoms: None  ADLScreening [facility] Assessment Services) Patient's cognitive ability adequate to safely complete daily activities?: Yes Patient able to express need for assistance with ADLs?: Yes Independently performs ADLs?: Yes (appropriate for developmental age)  Prior Inpatient Therapy Prior Inpatient Therapy: Yes Prior Therapy Dates: January 2019 [facility] Reason for Treatment: Schizophrenia  Prior Outpatient Therapy Prior Outpatient Therapy: Yes Prior Therapy Dates: past Prior Therapy Facilty/Provider(s): [facility] Reason for Treatment: Schizophrenia Does patient have an ACCT team?: No Does patient have Intensive In-House Services?  : No Does patient have [facility] services? : Yes Does patient have P4CC services?: No  ADL Screening (condition at time of admission) Patient's cognitive ability adequate to safely complete daily activities?: Yes Is the patient deaf or have difficulty hearing?: No Does the patient have difficulty seeing, even when wearing glasses/contacts?: No Does the patient have difficulty concentrating, remembering, or making  decisions?: No Patient able to express need for assistance with ADLs?: Yes Does the patient have difficulty dressing or  bathing?: No Independently performs ADLs?: Yes (appropriate for developmental age) Does the patient have difficulty walking or climbing stairs?: No Weakness of Legs: None Weakness of Arms/Hands: None       Abuse/Neglect Assessment (Assessment to be complete while patient is alone) Abuse/Neglect Assessment Can Be Completed: Yes Physical Abuse: Denies Verbal Abuse: Denies Sexual Abuse: Denies Exploitation of patient/patient's resources: Denies Self-Neglect: Denies     Merchant navy officer (For Healthcare) Does Patient Have a Medical Advance Directive?: No Would patient like information on creating a medical advance directive?: No - Patient declined          Disposition: Gave clinical report to [name], NP who stated pt doesn't meet criteria for inpatient psychiatric treatment and recommends overnight observation and restart of medications. Notified Triage, RN and Michela Pitcher, PA-C of recommendation.    This service was provided via telemedicine using a 2-way, interactive audio and video technology.  Names of all persons participating in this telemedicine service and their role in this encounter. Name: Loretha Brasil Role: Patient  Name: Annamaria Boots Role: TTS Counselor  Name:  Role:   Name:  Role:    Annamaria Boots, MS, [facility] Therapeutic Triage Specialist  Annamaria Boots 02/09/2018 3:22 PM

## 2018-02-09 NOTE — ED Notes (Signed)
EKG given to Dr. James  

## 2018-02-09 NOTE — Care Management (Signed)
ED CM noted TTS done and [facility] observe overnight and to restart psych medications.

## 2018-02-09 NOTE — ED Notes (Signed)
Patient currently having TTS at this time.

## 2018-02-10 NOTE — BH Assessment (Signed)
[facility]HH Assessment Progress Note      TTS reassessment: Patient states that he continues to hear voices telling him that they are going to hurt him.  Patient states that he was off his mental health medications for three weeks and just re-started them two days ago.  Patient states that he lives independently, but states that the voices became so bad that he decided to come to the hospital in order to get some help.  He continues to feel like he needs to be hospitalized to get additional help with his mental health condition.  He states that he is sleeping and eating okay and he denies SI/HI.  Will staff with Hillery Jacksanika Lewis to see what her recommendations are at this poin due to his complaints of continued auditory hallucinations.

## 2018-02-10 NOTE — ED Notes (Signed)
Re-TTS being performed.  

## 2018-02-10 NOTE — ED Notes (Signed)
Malawiurkey sandwich and Sprite given as requested for snack. Lunch order request obtained. Shower supplies given - now states will shower later. Aware [facility] Counsel[facility] to discuss w/NP, [facility].

## 2018-02-10 NOTE — ED Notes (Signed)
Pt noted to be lying on bed - sleeping - wearing blue paper scrubs.

## 2018-02-10 NOTE — ED Notes (Signed)
States he wants to be inpt. Offered pt to shower - advised will after Re-TTS.

## 2018-02-10 NOTE — ED Notes (Signed)
Pt awake now asking for dinner. Advised will be delivered soon.

## 2018-02-10 NOTE — ED Notes (Signed)
Dinner order request received from pt. Pt given shower supplies incl pt's soap and deodorant d/t pt w/foul body odor.

## 2018-02-10 NOTE — ED Notes (Signed)
Woke pt so may advise of tx plan - reassess in am. Voiced understanding and agreement w/tx plan.

## 2018-02-10 NOTE — BH Assessment (Signed)
[facility] Assessment Progress Note     Per Hillery Jacksanika Lewis, NP, patient needs to be held overnight and continued on his medication and if at baseline tomorrow may be able to be discharged

## 2018-02-10 NOTE — ED Notes (Signed)
Breakfast tray ordered 

## 2018-02-10 NOTE — ED Notes (Signed)
Pt ambulatory to restroom with steady gait.

## 2018-02-10 NOTE — ED Notes (Signed)
Pt in shower.  

## 2018-02-11 NOTE — Discharge Instructions (Signed)
Please follow-up with the services provided.  Substance Abuse Treatment Programs  Intensive Outpatient Programs [facility]     [address]      [place], [place]                   [phone]       [facility] [address] [place], [place] [phone]  [facility]     (Inpatient and outpatient)     [address]           [phone]    [facility] [phone] (Suboxone and Methadone)  [address]      [place], [place] [address]      [phone]       [address] [place], [place] [phone]  [facility] (Outpatient/Inpatient, Chemical)    (insurance only) [phone]             [facility] (Groups & Residential) [place], [place] [phone]     [facility]     [address]     [place], [place]      [phone]       [facility] (for caregivers and family) [phone] [place], [place] [phone]      Residential Treatment Programs [facility]      [address], [place], [place] [address]  [phone]       [facility] [address] [place], [place] [address] [phone]  [facility]        [phone]       [facility] [phone]  [facility] ([facility]             [address]                                         [place], [place]                                                [phone] or [phone]                               [facility] [place] [address] [place], [address] [phone]  [facility]    [address]      [place], [place]     [phone]       [facility] [address] [place], [place] [phone]  [facility]   [address]     [place], [place] [address]     [phone]      Admissions: 8am-3pm M-F  [facility]idential Treatment Services  (RTS) [address] [place], [place] [phone]  [facility] Program: Residential Program [phone](90 Days)   [place], [place]      [phone] or [phone]     ADATC: [facility] [place], [place] (Walk in Hours over the weekend or by referral)  [facility] [address] [place], [place], [place] [address] [phone]  Crisis Mobile: [facility]:  [phone] (for crisis response 24 hours a day) [facility] Hotline:      [phone] Outpatient Psychiatry  and Counseling  [facility]: Mobile Crisis Management 24 hours:  [phone]  [facility] sliding scale fee and walk in schedule: M-F 8am-12pm/1pm-3pm [address]  [place], [place] [address] [phone]  [facility] [address] [place], [place] [address] [phone]  [facility] ([facility] new patient walk-in appointments available Monday - Friday 8am -3pm.          [address] [place], [place] [address] [phone] or crisis line- [phone]  [facility] Out[facility] Services/ Intensive Outpatient Therapy Program [address] [place], [place] [address] [phone]  [facility]                  Crisis Services      [phone]      [address]     [place], [place] [address]                 [place] [facility]   [facility] [phone] [address] [place], [place] [address]   [facility]          [address] [place],  [place], [place] [address]       [phone]  [facility] [address] [place], [place] [address] [phone]  [facility]    [address]    [place], [place] [address]     [phone]       Andee PolesParish McKinney, MD     [address]     [place] [address]     [phone]       [facility] [address] [place] [address]  [facility]     [address]     [place], [place]      [phone]       [facility] Eulogio DitchShamsher Ahluwalia, MD [address] [place], [place] [address] [phone]  [facility]     [address]     [place], [place] [address]     [phone]       [facility] [address] [place], [place] [address] [phone] Resources for Temporary Residential Assistance/Crisis Centers  DAY CENTERS [facility](IRC) M-F 8am-3pm   [address] [place], [place] [address]   [phone] Services include: laundry, barbering, support groups, case management, phone  & computer access, showers, AA/NA mtgs, mental health/substance abuse nurse, job skills class, disability information, VA assistance, spiritual classes, etc.   HOMELESS SHELTERS  [facility]     [facility]   [address], [place]     [phone]              [facility] (women and children)       [name] [place], [place] [address] [phone] [email or web address] for application and process Application Required  [facility]   [address]    [place] [address]     [phone]                    [facility] [facility] [address] [place] [place], [place] [address] [phone](schedule application appt.) Application Required  [facility] (women only)    [address]     [place], [place] [address]  [phone]      Intake starts 6pm daily Need valid ID, SSC, & Police report [employer] [address] [place], [place] [phone] Application Required  [employer] (men only)     [place].      [place], [place]       [place] (Pregnant women only) [address] [place],  [place]  [facility]      [address] [address]      [place], [place] [address]     [phone]             [facility] [address] [place], [place] 90 day commitment/SA/Application process  [facility](men only)     [address]     [place], [place]       Check-in at [facility] [address] [place], [place] [address] [phone] Men/Women/Women and Children must be there by 7 pm  [place], [place]

## 2018-02-11 NOTE — ED Notes (Addendum)
Pt aware being d/c'd to home. Voiced understanding and agreement w/tx plan. Offered for pt to shower - declined - stated "I'm good".

## 2018-02-11 NOTE — Care Management (Signed)
ED CM noted patient [facility] for discharge with outpatient resources.

## 2018-02-11 NOTE — ED Notes (Signed)
Pt to eat lunch then leave. Voiced understanding to follow up as instructed.

## 2018-02-11 NOTE — BHH Counselor (Signed)
  Reassessment- Pt denies SI/HI. Pt reports voices that he hears daily. Pt reports no change in behavior. Pt states he can follow-up with [facility] to manage his symptoms.   Per Alcario Droughtanika, NP notes yesterday. If Pt appears at baseline Pt can be D/C.   Inetta Fermoina, NP recommends D/C and follow-up with current providers.   Wolfgang PhoenixBrandi Shadrack Brummitt, [facility] Triage Specialist

## 2018-02-11 NOTE — ED Notes (Signed)
Snack given as requested.  

## 2018-02-21 IMAGING — CR DG CHEST 2V
2 series · 2 of 2 positions shown · non-contrast
Comparison: CT chest 07/13/2016.  Chest 07/13/2016.

CLINICAL DATA: Increased pain and shortness of breath this morning.
Smoker. Diagnosed with blood clots.

EXAM:
CHEST  2 VIEW

[w chest pa]
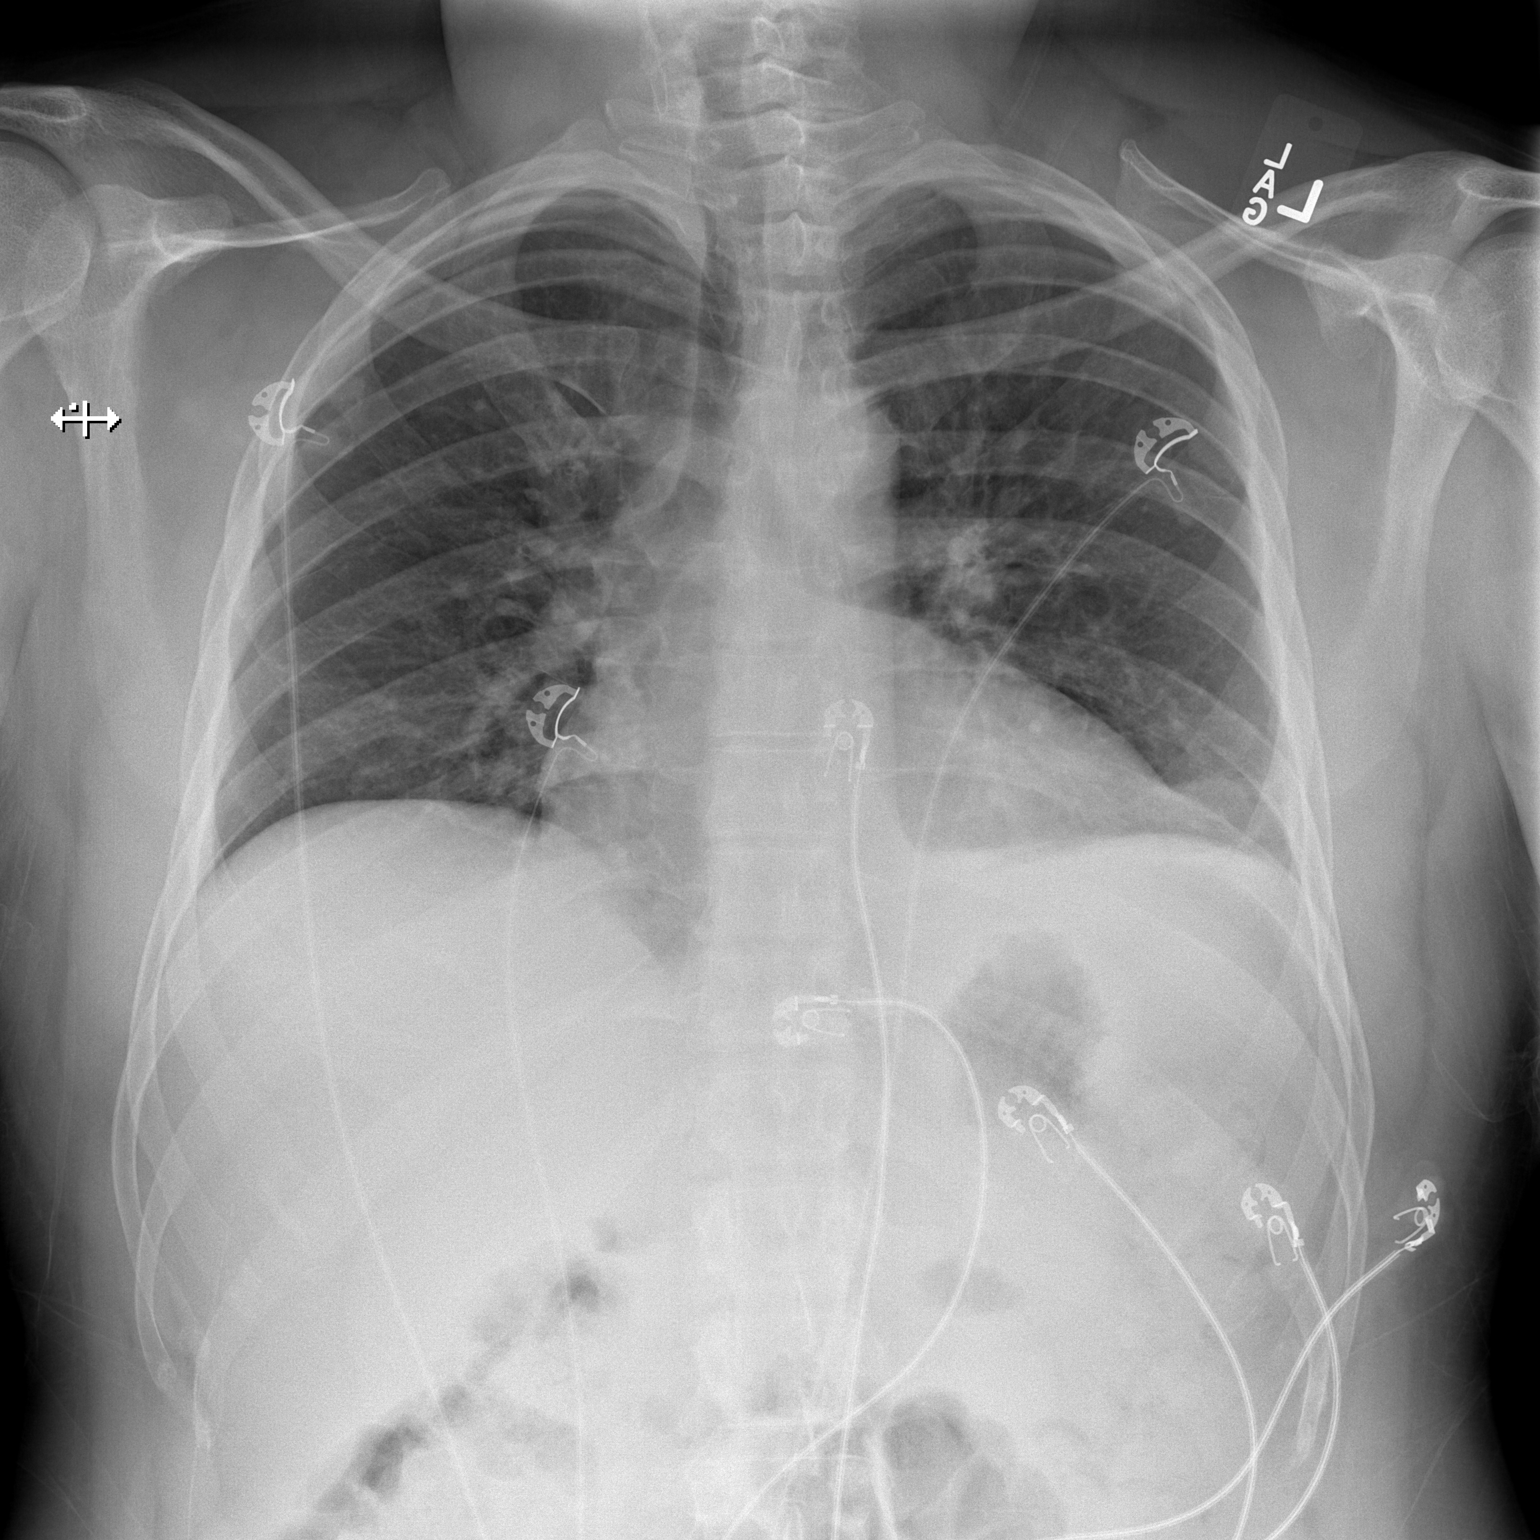

[w chest lat]
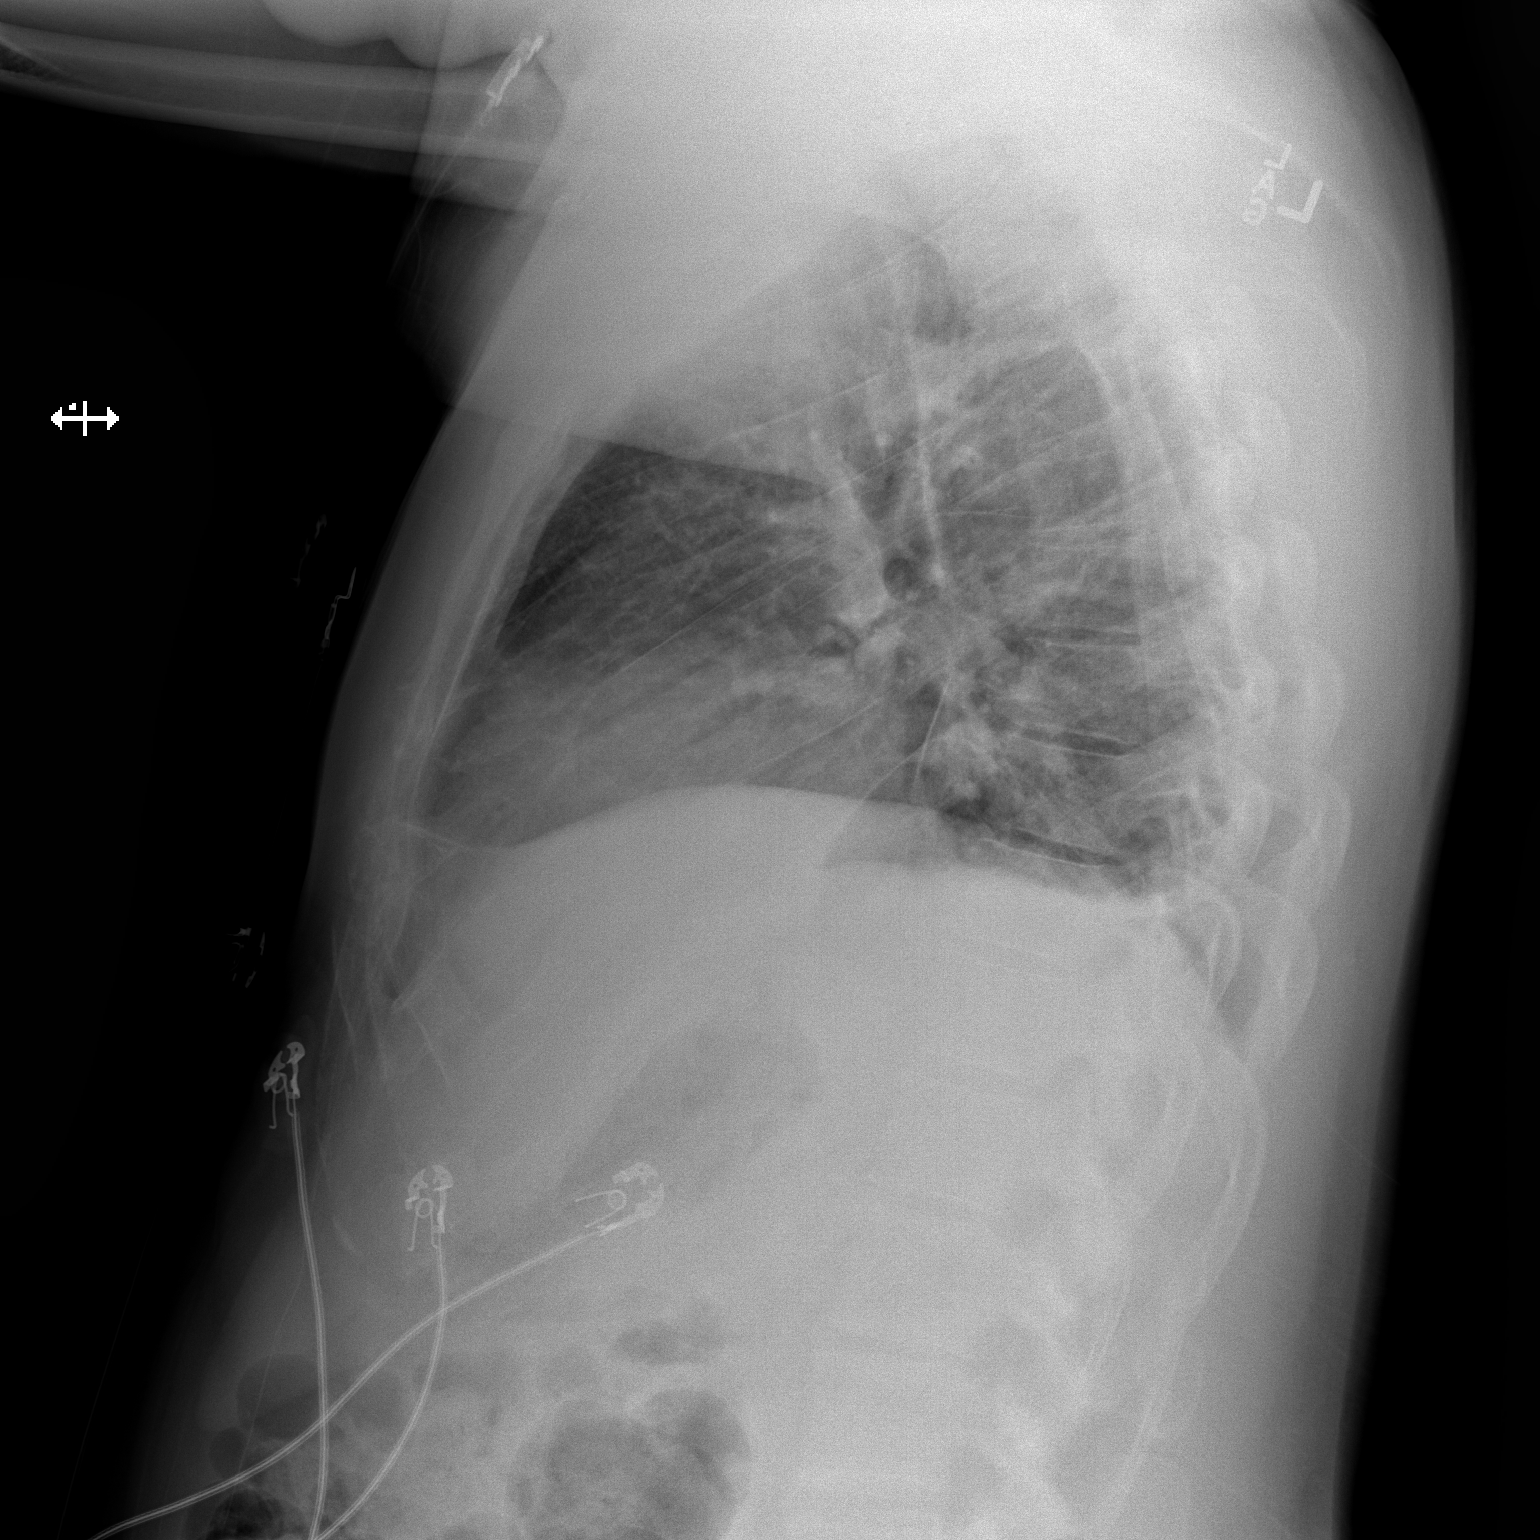

[2 of 2 positions shown; findings below may reference images not displayed]

FINDINGS: Shallow inspiration. Mild cardiac enlargement with normal pulmonary
vascularity. Focal consolidation in the left lung base similar to
prior study. No blunting of costophrenic angles. No pneumothorax.
Mediastinal contours appear intact.
IMPRESSION: Mild cardiac enlargement. Focal consolidation in the left lung base.
No change since prior study.

## 2018-02-22 ENCOUNTER — Emergency Department (HOSPITAL_COMMUNITY)
Admission: EM | Admit: 2018-02-22 | Discharge: 2018-02-22 | Disposition: A | Discharge status: Home / Self Care | Payer: Self-pay | Attending: Emergency Medicine | Admitting: Emergency Medicine

## 2018-02-22 ENCOUNTER — Encounter (HOSPITAL_COMMUNITY): Payer: Self-pay

## 2018-02-22 DIAGNOSIS — F17210 Nicotine dependence, cigarettes, uncomplicated: Secondary | ICD-10-CM | POA: Insufficient documentation

## 2018-02-22 DIAGNOSIS — F209 Schizophrenia, unspecified: Secondary | ICD-10-CM | POA: Insufficient documentation

## 2018-02-22 DIAGNOSIS — R443 Hallucinations, unspecified: Secondary | ICD-10-CM

## 2018-02-22 LAB — COMPREHENSIVE METABOLIC PANEL
ALT: 29 U/L (ref 17–63)
ANION GAP: 8 (ref 5–15)
AST: 21 U/L (ref 15–41)
Albumin: 3.7 g/dL (ref 3.5–5.0)
Alkaline Phosphatase: 46 U/L (ref 38–126)
BUN: 11 mg/dL (ref 6–20)
CHLORIDE: 103 mmol/L (ref 101–111)
CO2: 27 mmol/L (ref 22–32)
CREATININE: 0.99 mg/dL (ref 0.61–1.24)
Calcium: 9 mg/dL (ref 8.9–10.3)
GFR calc non Af Amer: >60 mL/min (ref >60)
Glucose, Bld: 98 mg/dL (ref 65–99)
POTASSIUM: 3.9 mmol/L (ref 3.5–5.1)
Sodium: 138 mmol/L (ref 135–145)
Total Bilirubin: 0.7 mg/dL (ref 0.3–1.2)
Total Protein: 6.7 g/dL (ref 6.5–8.1)

## 2018-02-22 LAB — CBC WITH DIFFERENTIAL/PLATELET
BASOS PCT: 1 %
Basophils Absolute: 0 10*3/uL (ref 0.0–0.1)
EOS ABS: 0.2 10*3/uL (ref 0.0–0.7)
Eosinophils Relative: 4 %
HCT: 39.4 % (ref 39.0–52.0)
HEMOGLOBIN: 12.7 g/dL — AB (ref 13.0–17.0)
Lymphocytes Relative: 38 %
Lymphs Abs: 1.6 10*3/uL (ref 0.7–4.0)
MCH: 27.7 pg (ref 26.0–34.0)
MCHC: 32.2 g/dL (ref 30.0–36.0)
MCV: 86 fL (ref 78.0–100.0)
MONOS PCT: 9 %
Monocytes Absolute: 0.4 10*3/uL (ref 0.1–1.0)
NEUTROS PCT: 48 %
Neutro Abs: 2.1 10*3/uL (ref 1.7–7.7)
Platelets: 302 10*3/uL (ref 150–400)
RBC: 4.58 MIL/uL (ref 4.22–5.81)
RDW: 13.1 % (ref 11.5–15.5)
WBC: 4.3 10*3/uL (ref 4.0–10.5)

## 2018-02-22 LAB — RAPID URINE DRUG SCREEN, HOSP PERFORMED
AMPHETAMINES: NOT DETECTED
BARBITURATES: NOT DETECTED
BENZODIAZEPINES: NOT DETECTED
COCAINE: NOT DETECTED
Opiates: NOT DETECTED
TETRAHYDROCANNABINOL: NOT DETECTED

## 2018-02-22 LAB — SALICYLATE LEVEL

## 2018-02-22 LAB — ACETAMINOPHEN LEVEL

## 2018-02-22 LAB — ETHANOL

## 2018-02-22 MED ORDER — RISPERIDONE 1 MG PO TABS
2.0000 mg | ORAL_TABLET | Freq: Once | ORAL | Status: AC
  Administered 2018-02-22: 2 mg via ORAL
  Filled 2018-02-22: qty 2

## 2018-02-22 MED ORDER — FLUOXETINE HCL 20 MG PO CAPS: 20.0000 mg | ORAL_CAPSULE | Freq: Every day | ORAL | Status: DC

## 2018-02-22 MED ORDER — BENZTROPINE MESYLATE 1 MG PO TABS
0.5000 mg | ORAL_TABLET | Freq: Once | ORAL | Status: AC
  Administered 2018-02-22: 0.5 mg via ORAL
  Filled 2018-02-22: qty 1

## 2018-02-22 MED ORDER — FLUOXETINE HCL 20 MG PO CAPS
20.0000 mg | ORAL_CAPSULE | Freq: Once | ORAL | Status: AC
  Administered 2018-02-22: 20 mg via ORAL
  Filled 2018-02-22: qty 1

## 2018-02-22 MED ORDER — RISPERIDONE 1 MG PO TABS: 2.0000 mg | ORAL_TABLET | Freq: Two times a day (BID) | ORAL | Status: DC

## 2018-02-22 MED ORDER — BENZTROPINE MESYLATE 1 MG PO TABS: 0.5000 mg | ORAL_TABLET | Freq: Every day | ORAL | Status: DC

## 2018-02-22 NOTE — ED Notes (Signed)
Pt belongings returned, updated on plan to d/c home. Pt requesting to shower before d/c. Ok with this plan, pt given supplies for bathing.

## 2018-02-22 NOTE — Consult Note (Signed)
  Tele assessment  Cole Barrett, 32 y.o., male [facility].  Patient seen via telepsych by TTS and  this provider; chart reviewed and consulted with Dr. Lucianne MussKumar on 02/22/18.  On evaluation Cole HawkingChristopher Adam Maione reports that he is hearing voices telling him that they are going to kill him.  Patient denies command hallucinations patient also denies suicidal/self-harm/homicidal ideation, and paranoia.  Patient states that he has been compliant with his medications but is unable to remember the name of the medications and how many times a day that he is supposed to take them.  Patient is states that he is only taking 2 medications that were prescribed and not a total of 4 different types of medication.  Rep[facility].  Also discussed with patient about taking monthly injection of a psychotropic which would help and he wouldn't have to worry about forgetting to take his medication patient stated that he did not like shots and wanted to continue taking the pill.  During assessment patient did not appear to be responding to internal/external stimuli or delusional thoughts.  Patient was calm and cooperative and answers all questions appropriately.  Patient psychiatrically cleared  Recommendations; patient is psychiatrically cleared; [facility] tomorrow morning.  Spoke with RN informed the patient would need a dose of his medication before leaving asked if she could speak with EDP to give patient 1 dose of his Risperdal, Prozac, and Cogentin.  And that he can look at my note for recommendations also.   For detailed note please see TTS assessment note  Coltyn Hanning B. Quantae Martel, NP

## 2018-02-22 NOTE — ED Notes (Signed)
Pt changed into burgundy scrubs, wanded by security. Signed medical clearance policy form. Belongings inventoried and placed in locker 5 in pod F. Pt has no valuables or medications with security/pharmacy.

## 2018-02-22 NOTE — ED Triage Notes (Signed)
Pt reports hearing voices stating that they are going to hurt him, denies SI/HI. Reports compliance with medications and states he wants to go to behavorial

## 2018-02-22 NOTE — ED Notes (Signed)
Patient aware we need a UA, unable to get one at this time.

## 2018-02-22 NOTE — ED Notes (Signed)
Regular Diet was ordered for Patient.

## 2018-02-22 NOTE — ED Notes (Signed)
Pt attempted to sign for discharge but esignature not responding.

## 2018-02-22 NOTE — BH Assessment (Signed)
Tele Assessment Note   Patient Name: Cole HawkingChristopher Adam Barrett MRN: [id number] Referring Physician: Rush Landmarkegeler, MD Location of [facility]: [facility] Location of Provider: Behavioral Health TTS Department  Cole Barrett is a 32 y.o. male [facility] This is pt's [facility]th [facility] for the same. Pt denies SI, HI, VH. Pt reports that he's been experiencing AH for a couple of years now, but they were so "bad" that he needed to come to the ED. Pt ends every sentence with something to the effect of, "I need to come in the hospital so the doctors can watch over me...". Pt reports that he's been medication compliant and [facility]. Per pt's MAR, he is prescribed 4 medications. Pt reports he only takes 2 medications, and doesn't know which 2 he takes, even after being told the names of all 4 medications. Pt indicates that he continues to hear the voices during the assessment but is able to attend appropriately to all questions asked and doesn't appear to be responding to any internal stimuli. Pt reports the voices are telling him "they gonna kill me", but denies that they tell him to do anything, such as kill himself or hurt someone else. Pt reports that his sleep and appetite are both "fine".   Shuvon Rankin, NP, also spoke with pt and he verified the same things to her that he shared with clinician. Shuvon advised pt that he needed to [facility]. Shuvon also requested to SamsonHayden, Charity fundraiserN, that she ask the provider to give pt a dose of his Risperdal, Prozac, and Cogentin before his d/c.     Diagnosis: Schizophrenia  Past Medical History:  Past Medical History:  Diagnosis Date  . Homelessness   . Hypercholesterolemia   . Mental disorder   . Paranoid behavior (HCC)   . PE (pulmonary embolism)   . Schizophrenia (HCC)     History reviewed. No pertinent surgical history.  Family History:  Family History  Problem  Relation Age of Onset  . Hypertension Mother     Social History:  reports that he has been smoking cigarettes.  He has been smoking about 0.50 packs per day. He has never used smokeless tobacco. He reports that he drinks alcohol. He reports that he has current or past drug history. Drug: Marijuana.  Additional Social History:  Alcohol / Drug Use Pain Medications: See MAR Prescriptions: See MAR Over the Counter: See MAR History of alcohol / drug use?: No history of alcohol / drug abuse(Pt reports occasional marijuana & alcohol use.)  CIWA: CIWA-Ar BP: (!) 145/70 Pulse Rate: 76 COWS:    Allergies:  Allergies  Allergen Reactions  . Haldol [Haloperidol] Shortness Of Breath    Home Medications:  (Not in a hospital admission)  OB/GYN Status:  No LMP for male patient.  General Assessment Data Assessment unable to be completed: Yes Reason for not completing assessment: [facility] ED will call when pt is ready for assessment.  [facility] T[facility] Assessment: In system Is this a Tele or Face-to-Face Assessment?: Tele Assessment Is this an Initial Assessment or a Re-assessment for this encounter?: Initial Assessment Marital status: Single Living Arrangements: Alone Can pt return to current living arrangement?: Yes Admission Status: Voluntary Is patient capable of signing voluntary admission?: Yes Referral Source: Self/Family/Friend Insurance type: none     Crisis Care Plan Living Arrangements: Alone Name of  Psychiatrist: [name] Name of Therapist: [facility]  Education Status Is patient currently in school?: No Is the patient employed, unemployed or receiving disability?: Employed  Risk to self with the past 6 months Suicidal Ideation: No Has patient been a risk to self within the past 6 months prior to admission? : No Suicidal Intent: No Has patient had any suicidal intent within the past 6 months prior to admission? : No Is patient at risk  for suicide?: No Suicidal Plan?: No Has patient had any suicidal plan within the past 6 months prior to admission? : No Access to Means: No Previous Attempts/Gestures: No Intentional Self Injurious Behavior: None Family Suicide History: No Persecutory voices/beliefs?: No Depression: No Substance abuse history and/or treatment for substance abuse?: Yes Suicide prevention information given to non-admitted patients: Not applicable  Risk to Others within the past 6 months Homicidal Ideation: No Does patient have any lifetime risk of violence toward others beyond the six months prior to admission? : No Thoughts of Harm to Others: No Current Homicidal Intent: No Current Homicidal Plan: No Access to Homicidal Means: No History of harm to others?: No Assessment of Violence: None Noted Does patient have access to weapons?: No Criminal Charges Pending?: No Does patient have a court date: No Is patient on probation?: Unknown  Psychosis Hallucinations: Auditory Delusions: None noted  Mental Status Report Appearance/Hygiene: Unremarkable Eye Contact: Fair Motor Activity: Unremarkable Speech: Logical/coherent Level of Consciousness: Alert Mood: Pleasant, Euthymic Affect: Appropriate to circumstance Anxiety Level: Minimal Thought Processes: Coherent, Relevant Judgement: Partial Orientation: Person, Place, Time, Situation, Appropriate for developmental age Obsessive Compulsive Thoughts/Behaviors: None  Cognitive Functioning Concentration: Normal Memory: Recent Impaired, Remote Impaired Is patient IDD: No Is patient DD?: No Insight: Poor Impulse Control: Good Appetite: Good Have you had any weight changes? : No Change Sleep: No Change Vegetative Symptoms: None  ADLScreening [facility] Assessment Services) Patient's cognitive ability adequate to safely complete daily activities?: Yes Patient able to express need for assistance with ADLs?: Yes Independently performs ADLs?: Yes  (appropriate for developmental age)  Prior Inpatient Therapy Prior Inpatient Therapy: Yes Prior Therapy Dates: January 2019 Prior Therapy Facilty/Provider(s): [facility] Reason for Treatment: hallucinations  Prior Outpatient Therapy Prior Outpatient Therapy: No Does patient have an ACCT team?: No Does patient have Intensive In-House Services?  : No Does patient have [facility] services? : Yes Does patient have P4CC services?: No  ADL Screening (condition at time of admission) Patient's cognitive ability adequate to safely complete daily activities?: Yes Is the patient deaf or have difficulty hearing?: No Does the patient have difficulty seeing, even when wearing glasses/contacts?: No Does the patient have difficulty concentrating, remembering, or making decisions?: No Patient able to express need for assistance with ADLs?: Yes Does the patient have difficulty dressing or bathing?: No Independently performs ADLs?: Yes (appropriate for developmental age) Does the patient have difficulty walking or climbing stairs?: No Weakness of Legs: None Weakness of Arms/Hands: None  Home Assistive Devices/Equipment Home Assistive Devices/Equipment: None    Abuse/Neglect Assessment (Assessment to be complete while patient is alone) Physical Abuse: Denies Verbal Abuse: Denies Sexual Abuse: Denies Exploitation of patient/patient's resources: Denies Self-Neglect: Denies     Merchant navy officer (For Healthcare) Does Patient Have a Medical Advance Directive?: No Would patient like information on creating a medical advance directive?: No - Patient declined          Disposition: Pt recommended for d/c and to f/u with [facility].  Disposition Initial Assessment Completed for this Encounter: Yes(consulted with [employer]  Rankin, NP)  This service was provided via telemedicine using a 2-way, interactive audio and Immunologist.  Names of all persons participating in this telemedicine service and  their role in this encounter.   Laddie Aquas 02/22/2018 12:46 PM

## 2018-02-22 NOTE — ED Provider Notes (Signed)
[facility] Provider Note   CSN: [id number] Arrival date & time: 02/22/18  0608     History   Chief Complaint Chief Complaint  Patient presents with  . Hallucinations    HPI Jacquelin HawkingChristopher Adam Schult is a 32 y.o. male with a past medical history of schizophrenia, alcohol abuse, MDD, paranoid behavior, homelessness who presents the emergency department today for acute on chronic auditory hallucinations.  Patient states that he has a history of the same.  He has been taking his medications as prescribed.  He notes that this morning he started hearing voices telling him to kill himself.  He denies any homicidal or suicidal ideations but is afraid these voices may escalate.  He denies any visual hallucinations.  No illicit drug use reported.  He has a history of chronic alcohol abuse but denies any recent alcohol use to myself.  No medical complaints at this time.  HPI  Past Medical History:  Diagnosis Date  . Homelessness   . Hypercholesterolemia   . Mental disorder   . Paranoid behavior (HCC)   . PE (pulmonary embolism)   . Schizophrenia [facility](HCC)     Patient Active Problem List   Diagnosis Date Noted  . Schizophrenia (HCC) 12/24/2017  . 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 Schizophrenia, paranoid (HCC)   . 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 MDD (major depressive disorder) 09/22/2017  . Alcohol abuse   . Auditory hallucinations   . Alcohol abuse with alcohol-induced mood disorder (HCC) 09/08/2016  . Adjustment disorder with depressed mood 09/07/2016  . Homelessness 09/03/2016  . Person feigning illness 09/03/2016  . 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 Cannabis use disorder, mild, abuse 08/29/2016  . Pulmonary emboli (HCC) 07/13/2016  . 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 Acute left flank pain 01/02/2014  . Pulmonary  embolism, bilateral (HCC) 01/02/2014    History reviewed. No pertinent surgical history.      Home Medications    Prior to Admission medications   Medication Sig Start Date End Date Taking? Authorizing Provider  benztropine (COGENTIN) 0.5 MG tablet Take 1 tablet (0.5 mg total) by mouth at bedtime. For prevention of drug induced tremors Patient not taking: Reported on 01/13/2018 12/29/17   Muthersbaugh, Dahlia ClientHannah, PA-C  FLUoxetine (PROZAC) 20 MG capsule Take 1 capsule (20 mg total) by mouth daily. For depression Patient not taking: Reported on 01/13/2018 12/29/17   Muthersbaugh, Dahlia ClientHannah, PA-C  hydrOXYzine (ATARAX/VISTARIL) 25 MG tablet Take 1 tablet (25 mg total) by mouth 3 (three) times daily as needed for anxiety. Patient not taking: Reported on 01/13/2018 12/29/17   Muthersbaugh, Dahlia ClientHannah, PA-C  risperiDONE (RISPERDAL) 2 MG tablet Take 1 tablet (2 mg total) by mouth 2 (two) times daily. For mood control Patient not taking: Reported on 01/13/2018 12/29/17   Muthersbaugh, Dahlia ClientHannah, PA-C  traZODone (DESYREL) 50 MG tablet Take 1 tablet (50 mg total) by mouth at bedtime as needed for sleep. Patient not taking: Reported on 01/13/2018 12/29/17   Muthersbaugh, Dahlia ClientHannah, PA-C    Family History Family History  Problem Relation Age of Onset  . Hypertension Mother     Social History Social History   Tobacco Use  . Smoking status: Current Every Day Smoker    Packs/day: 0.50    Types: Cigarettes  . Smokeless tobacco: Never Used  Substance Use Topics  . Alcohol use: Yes    Comment: socially  . Drug use:  Yes    Types: Marijuana    Comment: Once every 2-3 months, Last used: Last month     Allergies   Haldol [haloperidol]   Review of Systems Review of Systems  All other systems reviewed and are negative.    Physical Exam Updated Vital Signs BP (!) 144/70 (BP Location: Right Arm)   Pulse 82   Temp 98.5 F (36.9 C) (Oral)   Resp 18   SpO2 100%   Physical Exam  Constitutional: He appears  well-developed and well-nourished.  HENT:  Head: Normocephalic and atraumatic.  Right Ear: External ear normal.  Left Ear: External ear normal.  Nose: Nose normal.  Mouth/Throat: Uvula is midline, oropharynx is clear and moist and mucous membranes are normal. No tonsillar exudate.  Eyes: Pupils are equal, round, and reactive to light. Right eye exhibits no discharge. Left eye exhibits no discharge. No scleral icterus.  Neck: Trachea normal. Neck supple. No spinous process tenderness present. No neck rigidity. Normal range of motion present.  Cardiovascular: Normal rate, regular rhythm and intact distal pulses.  No murmur heard. Pulses:      Radial pulses are 2+ on the right side, and 2+ on the left side.       Dorsalis pedis pulses are 2+ on the right side, and 2+ on the left side.       Posterior tibial pulses are 2+ on the right side, and 2+ on the left side.  No lower extremity swelling or edema. Calves symmetric in size bilaterally.  Pulmonary/Chest: Effort normal and breath sounds normal. He exhibits no tenderness.  Abdominal: Soft. Bowel sounds are normal. There is no tenderness. There is no rebound and no guarding.  Musculoskeletal: He exhibits no edema.  Lymphadenopathy:    He has no cervical adenopathy.  Neurological: He is alert.  Skin: Skin is warm and dry. No rash noted. He is not diaphoretic.  Psychiatric: He has a normal mood and affect.  Nursing note and vitals reviewed.    ED Treatments / Results  Labs (all labs ordered are listed, but only abnormal results are displayed) Labs Reviewed  ACETAMINOPHEN LEVEL - Abnormal; Notable for the following components:      Result Value   Acetaminophen (Tylenol), Serum <10 (*)    All other components within normal limits  CBC WITH DIFFERENTIAL/PLATELET - Abnormal; Notable for the following components:   Hemoglobin 12.7 (*)    All other components within normal limits  RAPID URINE DRUG SCREEN, HOSP PERFORMED  COMPREHENSIVE  METABOLIC PANEL  ETHANOL  SALICYLATE LEVEL    EKG None  Radiology No results found.  Procedures Procedures (including critical care time)  Medications Ordered in ED Medications - No data to display   Initial Impression / Assessment and Plan / ED Course  I have reviewed the triage vital signs and the nursing notes.  Pertinent labs & imaging results that were available during my care of the patient were reviewed by me and considered in my medical decision making (see chart for details).     32 y.o. male presenting for auditory hallucinations that are telling him to kill himself.  He has a history of schizophrenia.  He is compliant with home medications.  Patient is awake, alert.  No physical complaints at this time.  Vital signs are reassuring.  Screening lab work will be drawn.  Will reorder home medications.  Will order TTS consult.  Medical screening labs reassuring.  Patient is medically cleared.  He is placed in  psych hold.   Patient seen and discharged by TTS.  Pt recommended for d/c and to f/u with [facility].   Final Clinical Impressions(s) / ED Diagnoses   Final diagnoses:  Hallucinations    ED Discharge Orders    None       Princella Pellegrini 02/22/18 1406    Tegeler, Canary Brim, MD 02/22/18 [id number]

## 2018-02-22 NOTE — Discharge Instructions (Addendum)
Please follow-[facility] with [facility] as [facility] by [facility].  Please follow-up with your primary care physician.

## 2018-03-05 DIAGNOSIS — F17210 Nicotine dependence, cigarettes, uncomplicated: Secondary | ICD-10-CM | POA: Insufficient documentation

## 2018-03-05 DIAGNOSIS — F22 Delusional disorders: Secondary | ICD-10-CM | POA: Insufficient documentation

## 2018-03-06 ENCOUNTER — Emergency Department (HOSPITAL_COMMUNITY)
Admission: EM | Admit: 2018-03-06 | Discharge: 2018-03-06 | Disposition: A | Discharge status: Home / Self Care | Payer: Self-pay | Attending: Emergency Medicine | Admitting: Emergency Medicine

## 2018-03-06 ENCOUNTER — Encounter (HOSPITAL_COMMUNITY): Payer: Self-pay | Admitting: Family Medicine

## 2018-03-06 DIAGNOSIS — F1290 Cannabis use, unspecified, uncomplicated: Secondary | ICD-10-CM

## 2018-03-06 DIAGNOSIS — R443 Hallucinations, unspecified: Secondary | ICD-10-CM

## 2018-03-06 DIAGNOSIS — R440 Auditory hallucinations: Secondary | ICD-10-CM | POA: Diagnosis present

## 2018-03-06 DIAGNOSIS — F22 Delusional disorders: Secondary | ICD-10-CM

## 2018-03-06 DIAGNOSIS — F17210 Nicotine dependence, cigarettes, uncomplicated: Secondary | ICD-10-CM

## 2018-03-06 LAB — COMPREHENSIVE METABOLIC PANEL
ALBUMIN: 3.8 g/dL (ref 3.5–5.0)
ALT: 41 U/L (ref 17–63)
AST: 26 U/L (ref 15–41)
Alkaline Phosphatase: 49 U/L (ref 38–126)
Anion gap: 12 (ref 5–15)
BUN: 11 mg/dL (ref 6–20)
CHLORIDE: 107 mmol/L (ref 101–111)
CO2: 21 mmol/L — AB (ref 22–32)
CREATININE: 0.96 mg/dL (ref 0.61–1.24)
Calcium: 8.5 mg/dL — ABNORMAL LOW (ref 8.9–10.3)
GFR calc Af Amer: >60 mL/min (ref >60)
GLUCOSE: 102 mg/dL — AB (ref 65–99)
Potassium: 3.9 mmol/L (ref 3.5–5.1)
Sodium: 140 mmol/L (ref 135–145)
Total Bilirubin: 0.7 mg/dL (ref 0.3–1.2)
Total Protein: 7.4 g/dL (ref 6.5–8.1)

## 2018-03-06 LAB — CBC
HCT: 38.9 % — ABNORMAL LOW (ref 39.0–52.0)
HEMOGLOBIN: 13 g/dL (ref 13.0–17.0)
MCH: 28.3 pg (ref 26.0–34.0)
MCHC: 33.4 g/dL (ref 30.0–36.0)
MCV: 84.7 fL (ref 78.0–100.0)
Platelets: 302 10*3/uL (ref 150–400)
RBC: 4.59 MIL/uL (ref 4.22–5.81)
RDW: 13.2 % (ref 11.5–15.5)
WBC: 4.3 10*3/uL (ref 4.0–10.5)

## 2018-03-06 LAB — ACETAMINOPHEN LEVEL: Acetaminophen (Tylenol), Serum: <10 ug/mL — ABNORMAL LOW (ref 10–30)

## 2018-03-06 LAB — SALICYLATE LEVEL: Salicylate Lvl: <7 mg/dL (ref 2.8–30.0)

## 2018-03-06 LAB — ETHANOL: ALCOHOL ETHYL (B): 142 mg/dL — AB (ref <10)

## 2018-03-06 MED ORDER — PALIPERIDONE ER 6 MG PO TB24: 6.0000 mg | ORAL_TABLET | Freq: Every day | ORAL | 0 refills | Status: DC

## 2018-03-06 MED ORDER — PALIPERIDONE ER 6 MG PO TB24
6.0000 mg | ORAL_TABLET | Freq: Every day | ORAL | Status: DC
  Administered 2018-03-06: 6 mg via ORAL
  Filled 2018-03-06 (×2): qty 1

## 2018-03-06 NOTE — Discharge Instructions (Signed)
For your behavioral health needs you are advised to follow up with [facility].  New patients are seen at their walk-in clinic.  Walk-in hours are Monday - Friday from 8:00 am - 12:00 pm, and from 1:00 pm - 3:00 pm.  Walk-in patients are seen on a first come, first served basis, so try to arrive as early as possible for the best chance of being seen the same day.  There is an initial fee of $22.50: ° °     [facility] °     [address] °     [place], [place] [address] °     [phone] °

## 2018-03-06 NOTE — ED Notes (Signed)
ED Provider at bedside. 

## 2018-03-06 NOTE — BH Assessment (Signed)
Assessment Note  Cole Barrett is an 32 y.o. male.  -Clinician reviewed note by Terrilyn SaverJoanna Inman, RN.  [place] and escorted [place] [facility] by [employer] [employer]. Patient reports he is hearing voices. Furthermore, reports the voices are telling him that they are going to harm him. He denies feeling SI/HI and denies the voices are telling him to harm himself or others  [facility] because he is hearing voices telling him they are going to kill him.  This is pt's [facility]th presentation to a [facility] ED for the same. Pt denies SI, HI, VH.  Patient says often that he "just want to go to any facility where the doctors can watch over me."    Pt is unable to name his medications.  He says [facility] "to try to get in."  Patient has past hx of [facility] monitoring his medications.  Pt is currently non-compliant with medications.  Patient has BAL of 142 at 01:39.  He says he drinks 1-2 beers at a time every other day.  Pt denies other drug use.  Patient says he has a job and lives by himself.  Patient however has strong body odor, indicating poor hygiene or depression or actual homelessness.  -Clinician discussed patient disposition with Donell SievertSpencer Simon, PA.  He said that patient needed to be seen by psychiatry in the morning.  Clinician let Etta GrandchildShari Upsill, PA know.   Diagnosis: F20.9 Schizophrenia  Past Medical History:  Past Medical History:  Diagnosis Date  . Homelessness   . Hypercholesterolemia   . Mental disorder   . Paranoid behavior (HCC)   . PE (pulmonary embolism)   . Schizophrenia (HCC)     History reviewed. No pertinent surgical history.  Family History:  Family History  Problem Relation Age of Onset  . Hypertension Mother     Social History:  reports that he has been smoking cigarettes.  He has been smoking about 0.50 packs per day. He has never used smokeless tobacco. He reports  that he drinks alcohol. He reports that he has current or past drug history. Drug: Marijuana.  Additional Social History:  Alcohol / Drug Use Pain Medications: None Prescriptions: "I have a little bit but I don't know the names of it." Over the Counter: None History of alcohol / drug use?: Yes Withdrawal Symptoms: Patient aware of relationship between substance abuse and physical/medical complications Substance #1 Name of Substance 1: ETOH 1 - Age of First Use: Teenager 1 - Amount (size/oz): 1-2 beers at a time 1 - Frequency: Every other day 1 - Duration: on-going 1 - Last Use / Amount: 04/08  BAL was 142 at 01:39  CIWA: CIWA-Ar BP: 128/80 Pulse Rate: 70 COWS:    Allergies:  Allergies  Allergen Reactions  . Haldol [Haloperidol] Shortness Of Breath    Home Medications:  (Not in a hospital admission)  OB/GYN Status:  No LMP for male patient.  General Assessment Data Location of Assessment: [facility] ED TTS Assessment: In system Is this a Tele or Face-to-Face Assessment?: Face-to-Face Is this an Initial Assessment or a Re-assessment for this encounter?: Initial Assessment Marital status: Single Is patient pregnant?: No Pregnancy Status: No Living Arrangements: Alone Can pt return to current living arrangement?: Yes Admission Status: Voluntary Is patient capable of signing voluntary admission?: Yes Referral Source: Self/Family/Friend(Pt contacted police.) Insurance type: self pay     Crisis Care  Plan Living Arrangements: Alone Name of Psychiatrist: [name] Name of Therapist: [name]  Education Status Is patient currently in school?: No Is the patient employed, unemployed or receiving disability?: Employed  Risk to self with the past 6 months Suicidal Ideation: No Has patient been a risk to self within the past 6 months prior to admission? : No Suicidal Intent: No Has patient had any suicidal intent within the past 6 months prior to admission? : No Is patient at  risk for suicide?: No Suicidal Plan?: No Has patient had any suicidal plan within the past 6 months prior to admission? : No Access to Means: No What has been your use of drugs/alcohol within the last 12 months?: ETOH Previous Attempts/Gestures: Yes How many times?: 0 Other Self Harm Risks: None Triggers for Past Attempts: None known Intentional Self Injurious Behavior: None Family Suicide History: No Recent stressful life event(s): (Pt denies any stressful event recently.) Persecutory voices/beliefs?: Yes Depression: No Depression Symptoms: (Pt denies depressive symptoms.) Substance abuse history and/or treatment for substance abuse?: Yes Suicide prevention information given to non-admitted patients: Not applicable  Risk to Others within the past 6 months Homicidal Ideation: No Does patient have any lifetime risk of violence toward others beyond the six months prior to admission? : No Thoughts of Harm to Others: No Current Homicidal Intent: No Current Homicidal Plan: No Access to Homicidal Means: No Identified Victim: No one History of harm to others?: No Assessment of Violence: None Noted Violent Behavior Description: Pt denies Does patient have access to weapons?: No Criminal Charges Pending?: No Describe Pending Criminal Charges: Pt denies Does patient have a court date: No Court Date: (Pt denies legal issues) Is patient on probation?: Unknown  Psychosis Hallucinations: Auditory(Voices saying people are trying to kill him.) Delusions: Persecutory  Mental Status Report Appearance/Hygiene: Body odor, Poor hygiene, In scrubs Eye Contact: Poor Motor Activity: Freedom of movement, Unremarkable Speech: Logical/coherent, Slow Level of Consciousness: Drowsy Mood: Depressed, Helpless Affect: Appropriate to circumstance Anxiety Level: Minimal Thought Processes: Coherent, Relevant Judgement: Impaired Orientation: Place, Person, Situation Obsessive Compulsive  Thoughts/Behaviors: None  Cognitive Functioning Concentration: Normal Memory: Recent Impaired, Remote Intact Is patient IDD: No Level of Function: N/A Is patient DD?: No Insight: Poor Impulse Control: Poor Appetite: Good Have you had any weight changes? : No Change Amount of the weight change? (lbs): (N/A) Sleep: No Change Total Hours of Sleep: 8 Vegetative Symptoms: None  ADLScreening [facility] Assessment Services) Patient's cognitive ability adequate to safely complete daily activities?: Yes Patient able to express need for assistance with ADLs?: Yes Independently performs ADLs?: Yes (appropriate for developmental age)  Prior Inpatient Therapy Prior Inpatient Therapy: Yes Prior Therapy Dates: January 2019; 10/2017; 08/2017 Prior Therapy Facilty/Provider(s): [facility] Reason for Treatment: hallucinations  Prior Outpatient Therapy Prior Outpatient Therapy: No Prior Therapy Dates: past Prior Therapy Facilty/Provider(s): [facility] Reason for Treatment: Schizophrenia Does patient have an ACCT team?: No Does patient have Intensive In-House Services?  : No Does patient have [facility] services? : Yes Does patient have P4CC services?: No  ADL Screening (condition at time of admission) Patient's cognitive ability adequate to safely complete daily activities?: Yes Is the patient deaf or have difficulty hearing?: No Does the patient have difficulty seeing, even when wearing glasses/contacts?: No Does the patient have difficulty concentrating, remembering, or making decisions?: No Patient able to express need for assistance with ADLs?: Yes Does the patient have difficulty dressing or bathing?: No Independently performs ADLs?: Yes (appropriate for developmental age) Does the patient have  difficulty walking or climbing stairs?: No Weakness of Legs: None Weakness of Arms/Hands: None       Abuse/Neglect Assessment (Assessment to be complete while patient is alone) Physical Abuse:  Denies Verbal Abuse: Denies Sexual Abuse: Denies Exploitation of patient/patient's resources: Denies Self-Neglect: Denies     Merchant navy officer (For Healthcare) Does Patient Have a Medical Advance Directive?: No Would patient like information on creating a medical advance directive?: No - Patient declined          Disposition:  Disposition Initial Assessment Completed for this Encounter: Yes Patient referred to: Other (Comment)(Pt to be reviewed by PA)  On Site Evaluation by:   Reviewed with Physician:    Alexandria Lodge 03/06/2018 6:24 AM

## 2018-03-06 NOTE — ED Provider Notes (Signed)
[facility]-EMERGENCY DEPT Provider Note   CSN: [id number] Arrival date & time: 03/05/18  2351     History   Chief Complaint Chief Complaint  Patient presents with  . Psychiatric Evaluation    HPI Cole Barrett He is a 32 y.o. male.  The patient presents for evaluation of hallucinations. He states he is hearing voices. No SI/HI. The voices are not command voices. He states he hears them everyday and nothing is different or new. He was taking medications previously not any longer. He believes the voices are threatening him and he is paranoid someone will hurt him.   The history is provided by the patient. No language interpreter was used.    Past Medical History:  Diagnosis Date  . Homelessness   . Hypercholesterolemia   . Mental disorder   . Paranoid behavior (HCC)   . PE (pulmonary embolism)   . Schizophrenia [facility](HCC)     Patient Active Problem List   Diagnosis Date Noted  . Schizophrenia (HCC) 12/24/2017  . 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 Schizophrenia, paranoid (HCC)   . 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 MDD (major depressive disorder) 09/22/2017  . Alcohol abuse   . Auditory hallucinations   . Alcohol abuse with alcohol-induced mood disorder (HCC) 09/08/2016  . Adjustment disorder with depressed mood 09/07/2016  . Homelessness 09/03/2016  . Person feigning illness 09/03/2016  . 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 Cannabis use disorder, mild, abuse 08/29/2016  . Pulmonary emboli (HCC) 07/13/2016  . 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 Acute left flank pain 01/02/2014  . Pulmonary embolism, bilateral (HCC) 01/02/2014    History reviewed. No pertinent surgical history.      Home Medications    Prior to Admission medications   Medication Sig Start Date End Date Taking? Authorizing Provider  benztropine  (COGENTIN) 0.5 MG tablet Take 1 tablet (0.5 mg total) by mouth at bedtime. For prevention of drug induced tremors Patient not taking: Reported on 01/13/2018 12/29/17   Muthersbaugh, Dahlia ClientHannah, PA-C  FLUoxetine (PROZAC) 20 MG capsule Take 1 capsule (20 mg total) by mouth daily. For depression Patient not taking: Reported on 01/13/2018 12/29/17   Muthersbaugh, Dahlia ClientHannah, PA-C  hydrOXYzine (ATARAX/VISTARIL) 25 MG tablet Take 1 tablet (25 mg total) by mouth 3 (three) times daily as needed for anxiety. Patient not taking: Reported on 01/13/2018 12/29/17   Muthersbaugh, Dahlia ClientHannah, PA-C  risperiDONE (RISPERDAL) 2 MG tablet Take 1 tablet (2 mg total) by mouth 2 (two) times daily. For mood control Patient not taking: Reported on 01/13/2018 12/29/17   Muthersbaugh, Dahlia ClientHannah, PA-C  traZODone (DESYREL) 50 MG tablet Take 1 tablet (50 mg total) by mouth at bedtime as needed for sleep. Patient not taking: Reported on 01/13/2018 12/29/17   Muthersbaugh, Dahlia ClientHannah, PA-C    Family History Family History  Problem Relation Age of Onset  . Hypertension Mother     Social History Social History   Tobacco Use  . Smoking status: Current Every Day Smoker    Packs/day: 0.50    Types: Cigarettes  . Smokeless tobacco: Never Used  Substance Use Topics  . Alcohol use: Yes    Comment: Last drink: yesterday   . Drug use: Yes    Types: Marijuana    Comment: Last used: 2 weeks ago     Allergies   Haldol [haloperidol]   Review of Systems Review of Systems  Constitutional: Negative for chills  and fever.  HENT: Negative.   Respiratory: Negative.   Cardiovascular: Negative.   Gastrointestinal: Negative.   Musculoskeletal: Negative.   Skin: Negative.   Neurological: Negative.   Psychiatric/Behavioral: Positive for hallucinations.     Physical Exam Updated Vital Signs BP 128/80 (BP Location: Left Arm)   Pulse 70   Temp 98.6 F (37 C) (Oral)   Resp 18   Ht 5\' 10"  (1.778 m)   Wt 99.8 kg (220 lb)   SpO2 95%   BMI 31.57 kg/m     Physical Exam  Constitutional: He is oriented to person, place, and time. He appears well-developed and well-nourished.  HENT:  Head: Normocephalic.  Neck: Normal range of motion. Neck supple.  Cardiovascular: Normal rate and regular rhythm.  Pulmonary/Chest: Effort normal and breath sounds normal. He has no wheezes. He has no rales.  Abdominal: Soft. Bowel sounds are normal. There is no tenderness. There is no rebound and no guarding.  Musculoskeletal: Normal range of motion.  Neurological: He is alert and oriented to person, place, and time.  Skin: Skin is warm and dry. No rash noted.  Psychiatric: He has a normal mood and affect. His speech is normal and behavior is normal. He is actively hallucinating. Thought content is paranoid.     ED Treatments / Results  Labs (all labs ordered are listed, but only abnormal results are displayed) Labs Reviewed  COMPREHENSIVE METABOLIC PANEL - Abnormal; Notable for the following components:      Result Value   CO2 21 (*)    Glucose, Bld 102 (*)    Calcium 8.5 (*)    All other components within normal limits  ETHANOL - Abnormal; Notable for the following components:   Alcohol, Ethyl (B) 142 (*)    All other components within normal limits  ACETAMINOPHEN LEVEL - Abnormal; Notable for the following components:   Acetaminophen (Tylenol), Serum <10 (*)    All other components within normal limits  CBC - Abnormal; Notable for the following components:   HCT 38.9 (*)    All other components within normal limits  SALICYLATE LEVEL  RAPID URINE DRUG SCREEN, HOSP PERFORMED    EKG None  Radiology No results found.  Procedures Procedures (including critical care time)  Medications Ordered in ED Medications - No data to display   Initial Impression / Assessment and Plan / ED Course  I have reviewed the triage vital signs and the nursing notes.  Pertinent labs & imaging results that were available during my care of the patient were  reviewed by me and considered in my medical decision making (see chart for details).     Patient is here for evaluation and management of hearing voices and paranoia that someone will hurt him. No SI/HI.  TTS provided consultation. The patient will stay in the ED until he has a psychiatric evaluation in the am.   Final Clinical Impressions(s) / ED Diagnoses   Final diagnoses:  None   1. Hallucinations 2. Paranoia  ED Discharge Orders    None       Elpidio Anis, PA-C 03/06/18 1610    Molpus, Jonny Ruiz, MD 03/06/18 [phone]

## 2018-03-06 NOTE — [facility] Suicide Risk Assessment (Signed)
Suicide Risk Assessment  Discharge Assessment   [facility] Discharge Suicide Risk Assessment   Principal Problem: Auditory hallucinations Discharge Diagnoses:  Patient Active Problem List   Diagnosis Date Noted  . Auditory hallucinations [R44.0]     Priority: Medium  . Schizophrenia (HCC) [F20.9] 12/24/2017  . 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 MDD (major depressive disorder), recurrent, severe, with psychosis (HCC) [F33.3] 11/21/2017  . MDD (major depressive disorder) [F32.9] 09/22/2017  . Alcohol abuse [F10.10]   . Alcohol abuse with alcohol-induced mood disorder (HCC) [F10.14] 09/08/2016  . Adjustment disorder with depressed mood [F43.21] 09/07/2016  . Homelessness [Z59.0] 09/03/2016  . Person feigning illness [Z76.5] 09/03/2016  . 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 Cannabis use disorder, mild, abuse [F12.10] 08/29/2016  . Pulmonary emboli (HCC) [I26.99] 07/13/2016  . Pulmonary embolism and infarction (HCC) [I26.99] 01/02/2014  . Acute left flank pain [R10.9] 01/02/2014  . Pulmonary embolism, bilateral (HCC) [I26.99] 01/02/2014    Total Time spent with patient: 45 minutes  Musculoskeletal: Strength & Muscle Tone: within normal limits Gait & Station: normal Patient leans: N/A  Psychiatric Specialty Exam: Physical Exam  Constitutional: He is oriented to person, place, and time. He appears well-developed and well-nourished.  HENT:  Head: Normocephalic.  Respiratory: Effort normal.  Musculoskeletal: Normal range of motion.  Neurological: He is alert and oriented to person, place, and time.  Psychiatric: His speech is normal and behavior is normal. Thought content normal. Cognition and memory are normal. He expresses impulsivity. He exhibits a depressed mood.   Review of Systems  Psychiatric/Behavioral: Positive for depression, hallucinations (auditory) and substance abuse.  Negative for memory loss and suicidal ideas. The patient is not nervous/anxious and does not have insomnia.   All other systems reviewed and are negative.  Blood pressure 128/80, pulse 70, temperature 98.6 F (37 C), temperature source Oral, resp. rate 18, height 5\' 10"  (1.778 m), weight 99.8 kg (220 lb), SpO2 95 %.Body mass index is 31.57 kg/m. General Appearance: Disheveled Eye Contact:  Fair Speech:  Clear and Coherent Volume:  Normal Mood:  Anxious Affect:  Congruent Thought Process:  Coherent and Linear Orientation:  Full (Time, Place, and Person) Thought Content:  Logical Suicidal Thoughts:  No Homicidal Thoughts:  No Memory:  Immediate;   Good Recent;   Good Remote;   Fair Judgement:  Impaired Insight:  Lacking Psychomotor Activity:  Decreased Concentration:  Concentration: Good and Attention Span: Good Recall:  Good Fund of Knowledge:  Good Language:  Good Akathisia:  No Handed:  Right AIMS (if indicated):    Assets:  Communication Skills Resilience ADL's:  Intact Cognition:  WNL   Mental Status Per Nursing Assessment::   On Admission:   auditory hallucinations  Demographic Factors:  Male, Low socioeconomic status and Unemployed  Loss Factors: Financial problems/change in socioeconomic status  Historical Factors: Impulsivity  Risk Reduction Factors:   Sense of responsibility to family  Continued Clinical Symptoms:  Depression:   Impulsivity Alcohol/Substance Abuse/Dependencies Schizophrenia:   Paranoid or undifferentiated type More than one psychiatric diagnosis Previous Psychiatric Diagnoses and Treatments  Cognitive Features That Contribute To Risk:  Closed-mindedness    Suicide Risk:  Minimal: No identifiable suicidal ideation.  Patients presenting with no risk factors but with morbid ruminations; may be classified as minimal risk based on the severity of the depressive symptoms    Plan Of Care/Follow-up recommendations:  Activity:  as  tolerated Diet:  Heart  Healthy  Laveda Abbe, NP 03/06/2018, 1:30 PM

## 2018-03-06 NOTE — ED Notes (Signed)
[facility] Expected date:  Expected time:  Means of arrival:  Comments: Hold triage 3

## 2018-03-06 NOTE — Consult Note (Addendum)
[facility] Consult   Reason for Consult:  Auditory hallucinations Referring Physician:  EDP Patient Identification: Cole Barrett MRN:  [id number] Principal Diagnosis: Auditory hallucinations Diagnosis:   Patient Active Problem List   Diagnosis Date Noted  . Auditory hallucinations [R44.0]     Priority: Medium  . Schizophrenia ([place]) [F20.9] 12/24/2017  . Alcohol abuse with alcohol-induced psychotic disorder ([place]) [F10.159] 12/22/2017  . Schizophrenia, paranoid ([place]) [F20.0]   . MDD (major depressive disorder), recurrent, severe, with psychosis ([place]) [F33.3] 11/21/2017  . MDD (major depressive disorder) [F32.9] 09/22/2017  . Alcohol abuse [F10.10]   . Alcohol abuse with alcohol-induced mood disorder ([place]) [F10.14] 09/08/2016  . Adjustment disorder with depressed mood [F43.21] 09/07/2016  . Homelessness [Z59.0] 09/03/2016  . Person feigning illness [Z76.5] 09/03/2016  . Brief psychotic disorder ([place]) [F23] 08/29/2016  . Cannabis use disorder, mild, abuse [F12.10] 08/29/2016  . Pulmonary emboli ([place]) [I26.99] 07/13/2016  . Pulmonary embolism and infarction ([place]) [I26.99] 01/02/2014  . Acute left flank pain [R10.9] 01/02/2014  . Pulmonary embolism, bilateral ([place]) [I26.99] 01/02/2014    Total Time spent with patient: 45 minutes  Subjective:   Cole Barrett is a 32 y.o. male patient admitted with auditory hallucinations.  HPI: Pt was seen and chart reviewed with treatment team and Dr Mariea Clonts. Pt denies suicidal/homicidal ideation, denies visual hallucinations and does not appear to be responding to internal stimuli. Pt stated he is hearing voices really bad and doesn't think his meds are working even though he is taking them. Pt was difficult to follow in his conversation and he was circumstantial with his thought process. Pt stated he can't go to [facility] because he is banned from there but then stated he gets his medications from [facility]. Pt is well  known to this ED and has had 19 visits and three inpatient admissions in the past month. Pt's UDS is negative, BAL 142. Pt always presents with the similar complaint in the recent past. Pt is at his baseline and is psychiatrically clear for discharge.   Past Psychiatric History: As above  Risk to Self:None Risk to Others: None Prior Inpatient Therapy: Prior Inpatient Therapy: Yes Prior Therapy Dates: January 2019; 10/2017; 08/2017 Prior Therapy Facilty/Provider(s): [facility] Reason for Treatment: hallucinations Prior Outpatient Therapy: Prior Outpatient Therapy: No Prior Therapy Dates: past Prior Therapy Facilty/Provider(s): [facility] Reason for Treatment: Schizophrenia Does patient have an ACCT team?: No Does patient have Intensive In-House Services?  : No Does patient have [facility] services? : Yes Does patient have P4CC services?: No  Past Medical History:  Past Medical History:  Diagnosis Date  . Homelessness   . Hypercholesterolemia   . Mental disorder   . Paranoid behavior ([place])   . PE (pulmonary embolism)   . Schizophrenia ([place])    History reviewed. No pertinent surgical history. Family History:  Family History  Problem Relation Age of Onset  . Hypertension Mother    Family Psychiatric  History: Unknown Social History:  Social History   Substance and Sexual Activity  Alcohol Use Yes   Comment: Last drink: yesterday      Social History   Substance and Sexual Activity  Drug Use Yes  . Types: Marijuana   Comment: Last used: 2 weeks ago    Social History   Socioeconomic History  . Marital status: Single    Spouse name: Not on file  . Number of children: Not on file  . Years of education: Not on file  . Highest education level:  Not on file  Occupational History  . Not on file  Social Needs  . Financial resource strain: Not on file  . Food insecurity:    Worry: Not on file    Inability: Not on file  . Transportation needs:    Medical: Not on file     Non-medical: Not on file  Tobacco Use  . Smoking status: Current Every Day Smoker    Packs/day: 0.50    Types: Cigarettes  . Smokeless tobacco: Never Used  Substance and Sexual Activity  . Alcohol use: Yes    Comment: Last drink: yesterday   . Drug use: Yes    Types: Marijuana    Comment: Last used: 2 weeks ago  . Sexual activity: Never  Lifestyle  . Physical activity:    Days per week: Not on file    Minutes per session: Not on file  . Stress: Not on file  Relationships  . Social connections:    Talks on phone: Not on file    Gets together: Not on file    Attends religious service: Not on file    Active member of club or organization: Not on file    Attends meetings of clubs or organizations: Not on file    Relationship status: Not on file  Other Topics Concern  . Not on file  Social History Narrative   ** Merged History Encounter **       Additional Social History: N/A    Allergies:   Allergies  Allergen Reactions  . Haldol [Haloperidol] Shortness Of Breath    Labs:  Results for orders placed or performed during the hospital encounter of 03/06/18 (from the past 48 hour(s))  Comprehensive metabolic panel     Status: Abnormal   Collection Time: 03/06/18  1:39 AM  Result Value Ref Range   Sodium 140 135 - 145 mmol/L   Potassium 3.9 3.5 - 5.1 mmol/L   Chloride 107 101 - 111 mmol/L   CO2 21 (L) 22 - 32 mmol/L   Glucose, Bld 102 (H) 65 - 99 mg/dL   BUN 11 6 - 20 mg/dL   Creatinine, Ser 0.96 0.61 - 1.24 mg/dL   Calcium 8.5 (L) 8.9 - 10.3 mg/dL   Total Protein 7.4 6.5 - 8.1 g/dL   Albumin 3.8 3.5 - 5.0 g/dL   AST 26 15 - 41 U/L   ALT 41 17 - 63 U/L   Alkaline Phosphatase 49 38 - 126 U/L   Total Bilirubin 0.7 0.3 - 1.2 mg/dL   GFR calc non Af Amer >60 >60 mL/min   GFR calc Af Amer >60 >60 mL/min    Comment: (NOTE) The eGFR has been calculated using the CKD EPI equation. This calculation has not been validated in all clinical situations. eGFR's persistently  <60 mL/min signify possible Chronic Kidney Disease.    Anion gap 12 5 - 15    Comment: Performed at [facility], [place] [address] [place], [place] [address]  Ethanol     Status: Abnormal   Collection Time: 03/06/18  1:39 AM  Result Value Ref Range   Alcohol, Ethyl (B) 142 (H) <10 mg/dL    Comment:        LOWEST DETECTABLE LIMIT FOR SERUM ALCOHOL IS 10 mg/dL FOR MEDICAL PURPOSES ONLY Performed at [facility] [address] [place], [place] [address]   Salicylate level     Status: None   Collection Time: 03/06/18  1:39 AM  Result Value Ref Range   Salicylate Lvl <5.1 2.8 - 30.0 mg/dL    Comment: Performed at [facility], [place] [address], [place], [place] [address]  Acetaminophen level     Status: Abnormal   Collection Time: 03/06/18  1:39 AM  Result Value Ref Range   Acetaminophen (Tylenol), Serum <10 (L) 10 - 30 ug/mL    Comment:        THERAPEUTIC CONCENTRATIONS VARY SIGNIFICANTLY. A RANGE OF 10-30 ug/mL MAY BE AN EFFECTIVE CONCENTRATION FOR MANY PATIENTS. HOWEVER, SOME ARE BEST TREATED AT CONCENTRATIONS OUTSIDE THIS RANGE. ACETAMINOPHEN CONCENTRATIONS >150 ug/mL AT 4 HOURS AFTER INGESTION AND >50 ug/mL AT 12 HOURS AFTER INGESTION ARE OFTEN ASSOCIATED WITH TOXIC REACTIONS. Performed at [name], [place] [address] [place], [place] [address]   cbc     Status: Abnormal   Collection Time: 03/06/18  1:39 AM  Result Value Ref Range   WBC 4.3 4.0 - 10.5 K/uL   RBC 4.59 4.22 - 5.81 MIL/uL   Hemoglobin 13.0 13.0 - 17.0 g/dL   HCT 38.9 (L) 39.0 - 52.0 %   MCV 84.7 78.0 - 100.0 fL   MCH 28.3 26.0 - 34.0 pg   MCHC 33.4 30.0 - 36.0 g/dL   RDW 13.2 11.5 - 15.5 %   Platelets 302 150 - 400 K/uL    Comment: Performed at [facility], [place] [address] [place],  [address]    Current Facility-Administered Medications  Medication Dose Route Frequency Provider Last Rate Last  Dose  . paliperidone (INVEGA) 24 hr tablet 6 mg  6 mg Oral Daily Faythe Dingwall, DO       Current Outpatient Medications  Medication Sig Dispense Refill  . benztropine (COGENTIN) 0.5 MG tablet Take 1 tablet (0.5 mg total) by mouth at bedtime. For prevention of drug induced tremors (Patient not taking: Reported on 01/13/2018) 10 tablet 0  . FLUoxetine (PROZAC) 20 MG capsule Take 1 capsule (20 mg total) by mouth daily. For depression (Patient not taking: Reported on 01/13/2018) 10 capsule 0  . hydrOXYzine (ATARAX/VISTARIL) 25 MG tablet Take 1 tablet (25 mg total) by mouth 3 (three) times daily as needed for anxiety. (Patient not taking: Reported on 01/13/2018) 20 tablet 0  . risperiDONE (RISPERDAL) 2 MG tablet Take 1 tablet (2 mg total) by mouth 2 (two) times daily. For mood control (Patient not taking: Reported on 01/13/2018) 20 tablet 0  . traZODone (DESYREL) 50 MG tablet Take 1 tablet (50 mg total) by mouth at bedtime as needed for sleep. (Patient not taking: Reported on 01/13/2018) 10 tablet 0    Musculoskeletal: Strength & Muscle Tone: within normal limits Gait & Station: normal Patient leans: N/A  Psychiatric Specialty Exam: Physical Exam  Nursing note and vitals reviewed. Constitutional: He is oriented to person, place, and time. He appears well-developed and well-nourished.  HENT:  Head: Normocephalic and atraumatic.  Neck: Normal range of motion.  Respiratory: Effort normal.  Musculoskeletal: Normal range of motion.  Neurological: He is alert and oriented to person, place, and time.  Psychiatric: His speech is normal and behavior is normal. Thought content normal. Cognition and memory are normal. He expresses impulsivity. He exhibits a depressed mood.    Review of Systems  Psychiatric/Behavioral: Positive for depression, hallucinations (auditory) and substance abuse. Negative for memory loss and suicidal ideas. The patient is not nervous/anxious and does not have insomnia.    All other systems reviewed and are negative.  Blood pressure 128/80, pulse 70, temperature 98.6 F (37 C), temperature source Oral, resp. rate 18, height '5\' 10"'$  (1.778 m), weight 99.8 kg (220 lb), SpO2 95 %.Body mass index is 31.57 kg/m.  General Appearance: Disheveled and malodorous, young, African American male with hair locs. NAD.  Eye Contact:  Fair  Speech:  Clear and Coherent  Volume:  Normal  Mood:  Anxious  Affect:  Congruent  Thought Process:  Coherent and Linear  Orientation:  Full (Time, Place, and Person)  Thought Content:  Logical  Suicidal Thoughts:  No  Homicidal Thoughts:  No  Memory:  Immediate;   Good Recent;   Good Remote;   Fair  Judgement:  Impaired  Insight:  Lacking  Psychomotor Activity:  Decreased  Concentration:  Concentration: Good and Attention Span: Good  Recall:  Good  Fund of Knowledge:  Good  Language:  Good  Akathisia:  No  Handed:  Right  AIMS (if indicated):   N/A  Assets:  Communication Skills Resilience  ADL's:  Intact  Cognition:  WNL  Sleep:   N/A     Treatment Plan Summary: Plan Auditory hallucinations  Discharge Home Take all medications as prescribed Avoid the use of alcohol and illicit drugs Follow up at [facility]  Disposition: No evidence of imminent risk to self or others at present.   Patient does not meet criteria for psychiatric inpatient admission. Supportive therapy provided about ongoing stressors. Discussed crisis plan, support from social network, calling 911, coming to the Emergency Department, and calling Suicide Hotline.  Ethelene Hal, NP 03/06/2018 1:21 PM   Patient seen face-to-face for psychiatric evaluation, chart reviewed and case discussed with the physician extender and developed treatment plan. Reviewed the information documented and agree with the treatment plan.  Buford Dresser, DO 03/06/18 9:44 PM

## 2018-03-06 NOTE — ED Notes (Signed)
Patient calm and cooperative.  Moved from triage to room 37.  Belongings placed in locker 37.  Patient requested a Sprite and that the TV be turned on.  Patient denies suicidal ideation but says he has been hearing voices threatening him.  Denies visual hallucinations.

## 2018-03-06 NOTE — ED Triage Notes (Signed)
[place] and [place] [facility] by [place] [employer]. Patient reports he is hearing voices. Furthermore, reports the voices are telling him that they are going to harm him. He denies feeling SI/HI and denies the voices are telling him to harm himself or others.

## 2018-03-06 NOTE — BH Assessment (Signed)
[facility]  Per Juanetta BeetsJacqueline Norman, DO, this pt does not require psychiatric hospitalization at this time.  [facility]  He reports that he is prohibited from returning to [facility]. Discharge instructions advise pt to [facility].  Pt's nurse has been notified.  Doylene Canninghomas Wilma Wuthrich, MA Triage Specialist [phone]

## 2018-03-11 ENCOUNTER — Other Ambulatory Visit: Payer: Self-pay

## 2018-03-11 ENCOUNTER — Emergency Department (HOSPITAL_COMMUNITY)
Admission: EM | Admit: 2018-03-11 | Discharge: 2018-03-12 | Disposition: A | Discharge status: Home / Self Care | Payer: Self-pay | Attending: Emergency Medicine | Admitting: Emergency Medicine

## 2018-03-11 ENCOUNTER — Encounter (HOSPITAL_COMMUNITY): Payer: Self-pay | Admitting: Emergency Medicine

## 2018-03-11 DIAGNOSIS — F250 Schizoaffective disorder, bipolar type: Secondary | ICD-10-CM | POA: Insufficient documentation

## 2018-03-11 DIAGNOSIS — R440 Auditory hallucinations: Secondary | ICD-10-CM | POA: Insufficient documentation

## 2018-03-11 DIAGNOSIS — F200 Paranoid schizophrenia: Secondary | ICD-10-CM | POA: Diagnosis present

## 2018-03-11 DIAGNOSIS — F17210 Nicotine dependence, cigarettes, uncomplicated: Secondary | ICD-10-CM | POA: Insufficient documentation

## 2018-03-11 LAB — CBC
HEMATOCRIT: 38.8 % — AB (ref 39.0–52.0)
Hemoglobin: 12.7 g/dL — ABNORMAL LOW (ref 13.0–17.0)
MCH: 27.6 pg (ref 26.0–34.0)
MCHC: 32.7 g/dL (ref 30.0–36.0)
MCV: 84.3 fL (ref 78.0–100.0)
PLATELETS: 295 10*3/uL (ref 150–400)
RBC: 4.6 MIL/uL (ref 4.22–5.81)
RDW: 13.4 % (ref 11.5–15.5)
WBC: 4.7 10*3/uL (ref 4.0–10.5)

## 2018-03-11 LAB — BASIC METABOLIC PANEL
ANION GAP: 10 (ref 5–15)
BUN: 10 mg/dL (ref 6–20)
CO2: 23 mmol/L (ref 22–32)
CREATININE: 0.97 mg/dL (ref 0.61–1.24)
Calcium: 8.8 mg/dL — ABNORMAL LOW (ref 8.9–10.3)
Chloride: 106 mmol/L (ref 101–111)
GLUCOSE: 130 mg/dL — AB (ref 65–99)
Potassium: 3.8 mmol/L (ref 3.5–5.1)
Sodium: 139 mmol/L (ref 135–145)

## 2018-03-11 LAB — RAPID URINE DRUG SCREEN, HOSP PERFORMED
Amphetamines: NOT DETECTED
BARBITURATES: NOT DETECTED
Benzodiazepines: NOT DETECTED
Cocaine: NOT DETECTED
Opiates: NOT DETECTED
Tetrahydrocannabinol: POSITIVE — AB

## 2018-03-11 LAB — ETHANOL

## 2018-03-11 MED ORDER — PALIPERIDONE ER 6 MG PO TB24
6.0000 mg | ORAL_TABLET | Freq: Every day | ORAL | Status: DC
  Administered 2018-03-11 – 2018-03-12 (×2): 6 mg via ORAL
  Filled 2018-03-11 (×2): qty 1

## 2018-03-11 MED ORDER — ACETAMINOPHEN 325 MG PO TABS: 650.0000 mg | ORAL_TABLET | ORAL | Status: DC | PRN

## 2018-03-11 NOTE — ED Notes (Signed)
Patient denies pain and is resting comfortably.  

## 2018-03-11 NOTE — ED Notes (Signed)
Eating snack given prior to showering.

## 2018-03-11 NOTE — ED Provider Notes (Signed)
[name] Provider Note   CSN: [id number] Arrival date & time: 03/11/18  0831     History   Chief Complaint Chief Complaint  Patient presents with  . Psychiatric Evaluation    hearing voices    HPI Cole Barrett is a 32 y.o. male past medical history of schizophrenia, alcohol abuse, homelessness, presenting to the ED with complaints of auditory hallucinations that worsened last night.  Patient states he is hearing voices that have gotten worse.  He states the voices are telling him that they will kill him.  He states the voices do not tell him to kill himself or harm others.  He reports compliance with his daily medications.  States [facility] is [facility] the [facility] and is unsure why.  States he is here requesting to be transferred to behavioral health hospital so that they can "keep an eye on me."  Denies alcohol or drug use today, SI or HI, visual hallucinations.  No medical complaints.  The history is provided by the patient.    Past Medical History:  Diagnosis Date  . Homelessness   . Hypercholesterolemia   . Mental disorder   . Paranoid behavior (HCC)   . PE (pulmonary embolism)   . Schizophrenia [facility](HCC)     Patient Active Problem List   Diagnosis Date Noted  . Schizophrenia (HCC) 12/24/2017  . 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 Schizophrenia, paranoid (HCC)   . 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 MDD (major depressive disorder) 09/22/2017  . Alcohol abuse   . Auditory hallucinations   . Alcohol abuse with alcohol-induced mood disorder (HCC) 09/08/2016  . Adjustment disorder with depressed mood 09/07/2016  . Homelessness 09/03/2016  . Person feigning illness 09/03/2016  . 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 Cannabis use disorder, mild, abuse 08/29/2016  . Pulmonary emboli (HCC) 07/13/2016  . Pulmonary embolism and  infarction (HCC) 01/02/2014  . Acute left flank pain 01/02/2014  . Pulmonary embolism, bilateral (HCC) 01/02/2014    History reviewed. No pertinent surgical history.      Home Medications    Prior to Admission medications   Medication Sig Start Date End Date Taking? Authorizing Provider  paliperidone (INVEGA) 6 MG 24 hr tablet Take 1 tablet (6 mg total) by mouth daily. 03/06/18   Laveda AbbeParks, Laurie Britton, NP    Family History Family History  Problem Relation Age of Onset  . Hypertension Mother     Social History Social History   Tobacco Use  . Smoking status: Current Every Day Smoker    Packs/day: 0.50    Types: Cigarettes  . Smokeless tobacco: Never Used  Substance Use Topics  . Alcohol use: Yes    Comment: Last drink: yesterday   . Drug use: Yes    Types: Marijuana    Comment: Last used: 2 weeks ago     Allergies   Haldol [haloperidol]   Review of Systems Review of Systems  Psychiatric/Behavioral: Positive for hallucinations. Negative for suicidal ideas.  All other systems reviewed and are negative.    Physical Exam Updated Vital Signs BP 129/71 (BP Location: Right Arm)   Pulse 79   Temp 98.1 F (36.7 C) (Oral)   Resp 18   SpO2 98%   Physical Exam  Constitutional: He appears well-developed and well-nourished.  Patient with poor hygiene.  Not in distress.  Resting comfortably.  HENT:  Head: Normocephalic and atraumatic.  Eyes: Conjunctivae are normal.  Pulmonary/Chest: Effort normal.  Abdominal: Soft.  Neurological: He is alert.  Skin: Skin is warm.  Psychiatric: He has a normal mood and affect. His behavior is normal. His speech is rapid and/or pressured. He expresses no homicidal and no suicidal ideation.  Does not appear to be actively hallucinating on exam.  Nursing note and vitals reviewed.    ED Treatments / Results  Labs (all labs ordered are listed, but only abnormal results are displayed) Labs Reviewed  CBC - Abnormal; Notable for  the following components:      Result Value   Hemoglobin 12.7 (*)    HCT 38.8 (*)    All other components within normal limits  BASIC METABOLIC PANEL - Abnormal; Notable for the following components:   Glucose, Bld 130 (*)    Calcium 8.8 (*)    All other components within normal limits  ETHANOL  RAPID URINE DRUG SCREEN, HOSP PERFORMED    EKG None  Radiology No results found.  Procedures Procedures (including critical care time)  Medications Ordered in ED Medications  acetaminophen (TYLENOL) tablet 650 mg (has no administration in time range)  paliperidone (INVEGA) 24 hr tablet 6 mg (6 mg Oral Given 03/11/18 1233)     Initial Impression / Assessment and Plan / ED Course  I have reviewed the triage vital signs and the nursing notes.  Pertinent labs & imaging results that were available during my care of the patient were reviewed by me and considered in my medical decision making (see chart for details).     Patient presenting to the ED with complaint of auditory hallucinations that are telling him that they will kill him.  He denies command hallucinations to kill himself or harm others.  Denies drug or alcohol use.  Requesting admission to behavioral health Hospital.  Patient is medically cleared.  TTS consulted for disposition.  TTS recommending overnight obs.  Final Clinical Impressions(s) / ED Diagnoses   Final diagnoses:  Auditory hallucinations    ED Discharge Orders    None       Adonai Selsor, [place] N, PA-C 03/11/18 1546    Gerhard Munch, MD 03/11/18 [phone]

## 2018-03-11 NOTE — ED Notes (Signed)
[facility]. Pt has his 2 large belongings bags w/him. Will allow pt to shower then will inventory belongings. Pt given copy of Medical Clearance Pt Policy form - voiced understanding.

## 2018-03-11 NOTE — ED Notes (Signed)
Pt in bathroom - so may obtain urine specimen.

## 2018-03-11 NOTE — ED Notes (Signed)
Patient given bag meal with drink. 

## 2018-03-11 NOTE — ED Triage Notes (Signed)
Pt. Stated, Im hearing voices this morning. Its happened before.

## 2018-03-11 NOTE — BH Assessment (Signed)
Tele Assessment Note   Patient Name: Cole Barrett MRN: [id number] Referring Physician: Robinson, [place] N, PA-C Location of Patient: [facility] Location of Provider: Behavioral Health TTS Department  Quentyn Kolbeck is an 32 y.o. male present to [facility] with complaints of auditory hallucinations. Patient seen for same complaint 03/06/2018 and in the last 6 months has been seen 20 times. Current occurrence patient report voices got 'louder' last night with instruction they are going to kill him. Patient report, "I am not doing so great, I need to get into behavioral health." Report the voices was doing good until last night then they got loud. Report voices are saying they are going to kill him. Report came to the hospital because he wanted to be around some doctors and nurses so they could watch him until the voices go down or go away. Report has been taking medication as prescribed. Patient repeated over and over he wants to get into behavioral health so he can get help with the voices hopefully for good. Patient previous admitted to [facility] 08/2017, 10/2017 and 11/2017.   Denies SI, HI and auditory hallucinations. Denies issues with sleep or appetite.  Patient has history of schizophrenia and hallucinations.   Diagnosis:  F25.0 Schizoaffective disorder, Bipolar type  Past Medical History:  Past Medical History:  Diagnosis Date  . Homelessness   . Hypercholesterolemia   . Mental disorder   . Paranoid behavior (HCC)   . PE (pulmonary embolism)   . Schizophrenia (HCC)     History reviewed. No pertinent surgical history.  Family History:  Family History  Problem Relation Age of Onset  . Hypertension Mother     Social History:  reports that he has been smoking cigarettes.  He has been smoking about 0.50 packs per day. He has never used smokeless tobacco. He reports that he drinks alcohol. He reports that he has current or past drug history. Drug: Marijuana.  Additional Social  History:  Alcohol / Drug Use Pain Medications: see MAR Prescriptions: see MAR Over the Counter: see MAR History of alcohol / drug use?: Yes Substance #1 Name of Substance 1: ETOH 1 - Age of First Use: Teenager 1 - Amount (size/oz): 1-2 beers at a time 1 - Frequency: Every other day 1 - Duration: on-going 1 - Last Use / Amount: unknown Substance #2 Name of Substance 2: alcohol 2 - Age of First Use: 16 2 - Amount (size/oz): varied 2 - Frequency: weekly 2 - Duration: ongoing 2 - Last Use / Amount: unknown  CIWA: CIWA-Ar BP: 129/71 Pulse Rate: 79 COWS:    Allergies:  Allergies  Allergen Reactions  . Haldol [Haloperidol] Shortness Of Breath    Home Medications:  (Not in a hospital admission)  OB/GYN Status:  No LMP for male patient.  General Assessment Data Location of Assessment: [facility] ED TTS Assessment: In system Is this a Tele or Face-to-Face Assessment?: Tele Assessment Is this an Initial Assessment or a Re-assessment for this encounter?: Initial Assessment Marital status: Single Maiden name: n/a Is patient pregnant?: No Pregnancy Status: No Living Arrangements: Alone Can pt return to current living arrangement?: Yes Admission Status: Voluntary Is patient capable of signing voluntary admission?: Yes Referral Source: Self/Family/Friend Insurance type: self-pay     Crisis Care Plan Living Arrangements: Alone Name of Psychiatrist: [name] Name of Therapist: [name]  Education Status Is patient currently in school?: No Is the patient employed, unemployed or receiving disability?: Employed  Risk to self with the past 6 months Suicidal  Ideation: No Has patient been a risk to self within the past 6 months prior to admission? : No Suicidal Intent: No Has patient had any suicidal intent within the past 6 months prior to admission? : No Is patient at risk for suicide?: No Suicidal Plan?: No Has patient had any suicidal plan within the past 6 months prior to  admission? : No Access to Means: No What has been your use of drugs/alcohol within the last 12 months?: alcohol Previous Attempts/Gestures: No How many times?: 0 Other Self Harm Risks: none report Triggers for Past Attempts: None known Intentional Self Injurious Behavior: None Family Suicide History: No Recent stressful life event(s): Other (Comment)(none report) Persecutory voices/beliefs?: Yes Depression: No Substance abuse history and/or treatment for substance abuse?: Yes Suicide prevention information given to non-admitted patients: Not applicable  Risk to Others within the past 6 months Homicidal Ideation: No Does patient have any lifetime risk of violence toward others beyond the six months prior to admission? : No Thoughts of Harm to Others: No Current Homicidal Intent: No Current Homicidal Plan: No Access to Homicidal Means: No Identified Victim: n/a History of harm to others?: No Assessment of Violence: None Noted Violent Behavior Description: n/a Does patient have access to weapons?: No Criminal Charges Pending?: No Describe Pending Criminal Charges: pt denies Does patient have a court date: No Is patient on probation?: Unknown  Psychosis Hallucinations: Auditory Delusions: None noted  Mental Status Report Appearance/Hygiene: Unremarkable Eye Contact: Fair Motor Activity: Freedom of movement Speech: Logical/coherent, Slow Level of Consciousness: Alert Mood: Pleasant Affect: Appropriate to circumstance Anxiety Level: Minimal Thought Processes: Coherent, Relevant Judgement: Unimpaired Orientation: Place, Person, Situation Obsessive Compulsive Thoughts/Behaviors: None  Cognitive Functioning Concentration: Normal Memory: Recent Intact, Remote Intact Is patient IDD: No Level of Function: n/a Is patient DD?: No Insight: Poor Impulse Control: Fair Appetite: Good Have you had any weight changes? : No Change Sleep: No Change Vegetative Symptoms:  None  ADLScreening [facility] Assessment Services) Patient's cognitive ability adequate to safely complete daily activities?: Yes Patient able to express need for assistance with ADLs?: Yes Independently performs ADLs?: Yes (appropriate for developmental age)  Prior Inpatient Therapy Prior Inpatient Therapy: Yes Prior Therapy Dates: January 2019; 10/2017; 08/2017 [facility] Reason for Treatment: hallucinations  Prior Outpatient Therapy Prior Outpatient Therapy: No Prior Therapy Dates: past Prior Therapy Facilty/Provider(s): [facility] Reason for Treatment: Schizophrenia Does patient have an ACCT team?: No Does patient have Intensive In-House Services?  : No Does patient have [facility] services? : Yes Does patient have P4CC services?: No  ADL Screening (condition at time of admission) Patient's cognitive ability adequate to safely complete daily activities?: Yes Is the patient deaf or have difficulty hearing?: No Does the patient have difficulty seeing, even when wearing glasses/contacts?: No Does the patient have difficulty concentrating, remembering, or making decisions?: No Patient able to express need for assistance with ADLs?: Yes Does the patient have difficulty dressing or bathing?: No Independently performs ADLs?: Yes (appropriate for developmental age) Does the patient have difficulty walking or climbing stairs?: No       Abuse/Neglect Assessment (Assessment to be complete while patient is alone) Abuse/Neglect Assessment Can Be Completed: Yes Physical Abuse: Denies Verbal Abuse: Denies Sexual Abuse: Denies Exploitation of patient/patient's resources: Denies Self-Neglect: Denies     Merchant navy officerAdvance Directives (For Healthcare) Does Patient Have a Medical Advance Directive?: No Would patient like information on creating a medical advance directive?: No - Patient declined    Additional Information 1:1 In  Past 12 Months?: No CIRT Risk:  No Elopement Risk: No Does patient have medical clearance?: No     Disposition:  Disposition Initial Assessment Completed for this Encounter: Yes Disposition of Patient: Hillery Jacks, NP, observe overnight for safety/security) Patient refused recommended treatment: No     Nalah Macioce [facility] 03/11/2018 12:25 PM

## 2018-03-11 NOTE — ED Notes (Signed)
Pt in shower.  

## 2018-03-11 NOTE — ED Notes (Signed)
Pt transported to pod F for telepsych assessment

## 2018-03-12 DIAGNOSIS — F1290 Cannabis use, unspecified, uncomplicated: Secondary | ICD-10-CM

## 2018-03-12 DIAGNOSIS — F200 Paranoid schizophrenia: Secondary | ICD-10-CM

## 2018-03-12 DIAGNOSIS — F17210 Nicotine dependence, cigarettes, uncomplicated: Secondary | ICD-10-CM

## 2018-03-12 NOTE — ED Notes (Signed)
Declined W/C at D/C and was escorted to lobby by RN. 

## 2018-03-12 NOTE — ED Triage Notes (Signed)
PT in BR for . Pt reports when asked he is still going.

## 2018-03-12 NOTE — Consult Note (Signed)
Telepsych Consultation   Reason for Consult: Schizophrenia Referring Physician:  EDP Location of Patient: [facility] ED Location of Provider: [place] Department  Patient Identification: Cole Barrett MRN:  [id number] Principal Diagnosis: Schizophrenia, paranoid [facility]) Diagnosis:   Patient Active Problem List   Diagnosis Date Noted  . Schizophrenia ([place]) [F20.9] 12/24/2017  . Alcohol abuse with alcohol-induced psychotic disorder ([place]) [F10.159] 12/22/2017  . Schizophrenia, paranoid ([place]) [F20.0]   . MDD (major depressive disorder), recurrent, severe, with psychosis ([place]) [F33.3] 11/21/2017  . MDD (major depressive disorder) [F32.9] 09/22/2017  . Alcohol abuse [F10.10]   . Auditory hallucinations [R44.0]   . Alcohol abuse with alcohol-induced mood disorder ([place]) [F10.14] 09/08/2016  . Adjustment disorder with depressed mood [F43.21] 09/07/2016  . Homelessness [Z59.0] 09/03/2016  . Person feigning illness [Z76.5] 09/03/2016  . Brief psychotic disorder ([place]) [F23] 08/29/2016  . Cannabis use disorder, mild, abuse [F12.10] 08/29/2016  . Pulmonary emboli ([place]) [I26.99] 07/13/2016  . Pulmonary embolism and infarction ([place]) [I26.99] 01/02/2014  . Acute left flank pain [R10.9] 01/02/2014  . Pulmonary embolism, bilateral ([place]) [I26.99] 01/02/2014    Total Time spent with patient: 20 minutes  Subjective:   Cole Barrett is a 32 y.o. male patient admitted with reports hearing voices that were getting "louder."    Per Initial Tele Assessment Note 03/11/2018 11:56 AM by Chrissie Noa Dubose:  Cole Barrett is an 32 y.o. male present to [facility] with complaints of auditory hallucinations. Patient seen for same complaint 03/06/2018 and in the last 6 months has been seen 20 times. Current occurrence patient report voices got 'louder' last night with instruction they are going to kill him. Patient report, "I am not doing so great, I need to get into behavioral  health." Report the voices was doing good until last night then they got loud. Report voices are saying they are going to kill him. Report came to the hospital because he wanted to be around some doctors and nurses so they could watch him until the voices go down or go away. Report has been taking medication as prescribed. Patient repeated over and over he wants to get into behavioral health so he can get help with the voices hopefully for good. Patient previous admitted to [facility] 08/2017, 10/2017 and 11/2017.   Denies Cole, HI and auditory hallucinations. Denies issues with sleep or appetite.  Patient has history of schizophrenia and hallucinations.   On Exam 03/12/2018: Patient was seen via tele-psych, chart reviewed with treatment team. Patient in bed, awake, alert and oriented x4. Patient reiterated the reason for this hospital admission as documented above. Patient stated, I came to the hospital yesterday because the voices where talking out louder than usual. But the voices are better today. I am ready to leave and follow up with outpatient. Patient was uncertain why he experienced a worsening of his symptoms as he denied medication non-compliance when directly asked. He currently denies any Cole/HI. Patient asking for another outpatient referral because "I can't go back to [facility]. I'm not sure why. I think it was because I was sleeping in their lobby." He reports occasional marijuana use but agrees to refrain from use. He denies any other concerns at this time and does not appear to be responding to internal stimuli during this encounter. Patient per notes is currently prescribed Invega to manage his thought disorder. Case discussed with Dr. Dwyane Dee who agrees with disposition to discharge home with outpatient follow up. Patient will be given outpatient follow up resources  for those who are uninsured.   Past Psychiatric History: As in H&P  Risk to Self: Suicidal Ideation: No Suicidal Intent: No Is patient  at risk for suicide?: No Suicidal Plan?: No Access to Means: No What has been your use of drugs/alcohol within the last 12 months?: alcohol How many times?: 0 Other Self Harm Risks: none report Triggers for Past Attempts: None known Intentional Self Injurious Behavior: None Risk to Others: Homicidal Ideation: No Thoughts of Harm to Others: No Current Homicidal Intent: No Current Homicidal Plan: No Access to Homicidal Means: No Identified Victim: n/a History of harm to others?: No Assessment of Violence: None Noted Violent Behavior Description: n/a Does patient have access to weapons?: No Criminal Charges Pending?: No Describe Pending Criminal Charges: pt denies Does patient have a court date: No Prior Inpatient Therapy: Prior Inpatient Therapy: Yes Prior Therapy Dates: January 2019; 10/2017; 08/2017 Prior Therapy Facilty/Provider(s): [facility] [place] Reason for Treatment: hallucinations Prior Outpatient Therapy: Prior Outpatient Therapy: No Prior Therapy Dates: past Prior Therapy Facilty/Provider(s): [facility] Reason for Treatment: Schizophrenia Does patient have an ACCT team?: No Does patient have Intensive In-House Services?  : No Does patient have [facility] services? : Yes Does patient have P4CC services?: No  Past Medical History:  Past Medical History:  Diagnosis Date  . Homelessness   . Hypercholesterolemia   . Mental disorder   . Paranoid behavior ([place])   . PE (pulmonary embolism)   . Schizophrenia ([place])    History reviewed. No pertinent surgical history. Family History:  Family History  Problem Relation Age of Onset  . Hypertension Mother    Family Psychiatric  History: Unknown  Social History:  Social History   Substance and Sexual Activity  Alcohol Use Yes   Comment: Last drink: yesterday      Social History   Substance and Sexual Activity  Drug Use Yes  . Types: Marijuana   Comment: Last used: 2 weeks ago    Social History   Socioeconomic History  .  Marital status: Single    Spouse name: Not on file  . Number of children: Not on file  . Years of education: Not on file  . Highest education level: Not on file  Occupational History  . Not on file  Social Needs  . Financial resource strain: Not on file  . Food insecurity:    Worry: Not on file    Inability: Not on file  . Transportation needs:    Medical: Not on file    Non-medical: Not on file  Tobacco Use  . Smoking status: Current Every Day Smoker    Packs/day: 0.50    Types: Cigarettes  . Smokeless tobacco: Never Used  Substance and Sexual Activity  . Alcohol use: Yes    Comment: Last drink: yesterday   . Drug use: Yes    Types: Marijuana    Comment: Last used: 2 weeks ago  . Sexual activity: Never  Lifestyle  . Physical activity:    Days per week: Not on file    Minutes per session: Not on file  . Stress: Not on file  Relationships  . Social connections:    Talks on phone: Not on file    Gets together: Not on file    Attends religious service: Not on file    Active member of club or organization: Not on file    Attends meetings of clubs or organizations: Not on file    Relationship status: Not on file  Other  Topics Concern  . Not on file  Social History Narrative   ** Merged History Encounter **       Additional Social History:    Allergies:   Allergies  Allergen Reactions  . Haldol [Haloperidol] Shortness Of Breath    Labs:  Results for orders placed or performed during the hospital encounter of 03/11/18 (from the past 48 hour(s))  CBC     Status: Abnormal   Collection Time: 03/11/18 11:08 AM  Result Value Ref Range   WBC 4.7 4.0 - 10.5 K/uL   RBC 4.60 4.22 - 5.81 MIL/uL   Hemoglobin 12.7 (L) 13.0 - 17.0 g/dL   HCT 38.8 (L) 39.0 - 52.0 %   MCV 84.3 78.0 - 100.0 fL   MCH 27.6 26.0 - 34.0 pg   MCHC 32.7 30.0 - 36.0 g/dL   RDW 13.4 11.5 - 15.5 %   Platelets 295 150 - 400 K/uL    Comment: Performed at [facility], [place] [address],  [place], [place] [address]  Basic metabolic panel     Status: Abnormal   Collection Time: 03/11/18 11:08 AM  Result Value Ref Range   Sodium 139 135 - 145 mmol/L   Potassium 3.8 3.5 - 5.1 mmol/L   Chloride 106 101 - 111 mmol/L   CO2 23 22 - 32 mmol/L   Glucose, Bld 130 (H) 65 - 99 mg/dL   BUN 10 6 - 20 mg/dL   Creatinine, Ser 0.97 0.61 - 1.24 mg/dL   Calcium 8.8 (L) 8.9 - 10.3 mg/dL   GFR calc non Af Amer >60 >60 mL/min   GFR calc Af Amer >60 >60 mL/min    Comment: (NOTE) The eGFR has been calculated using the CKD EPI equation. This calculation has not been validated in all clinical situations. eGFR's persistently <60 mL/min signify possible Chronic Kidney Disease.    Anion gap 10 5 - 15    Comment: Performed at [facility] [address], [place], [place] [address]  Ethanol     Status: None   Collection Time: 03/11/18 11:08 AM  Result Value Ref Range   Alcohol, Ethyl (B) <10 <10 mg/dL    Comment:        LOWEST DETECTABLE LIMIT FOR SERUM ALCOHOL IS 10 mg/dL FOR MEDICAL PURPOSES ONLY Performed at [facility], [place] [address], [place], [place] [address]   Rapid urine drug screen (hospital performed)     Status: Abnormal   Collection Time: 03/11/18  4:07 PM  Result Value Ref Range   Opiates NONE DETECTED NONE DETECTED   Cocaine NONE DETECTED NONE DETECTED   Benzodiazepines NONE DETECTED NONE DETECTED   Amphetamines NONE DETECTED NONE DETECTED   Tetrahydrocannabinol POSITIVE (A) NONE DETECTED   Barbiturates NONE DETECTED NONE DETECTED    Comment: (NOTE) DRUG SCREEN FOR MEDICAL PURPOSES ONLY.  IF CONFIRMATION IS NEEDED FOR ANY PURPOSE, NOTIFY LAB WITHIN 5 DAYS. LOWEST DETECTABLE LIMITS FOR URINE DRUG SCREEN Drug Class                     Cutoff (ng/mL) Amphetamine and metabolites    1000 Barbiturate and metabolites    200 Benzodiazepine                 338 Tricyclics and metabolites     300 Opiates and metabolites        300 Cocaine and metabolites         300 THC  50 Performed at [facility], [place] [address], [place], [place] [address]     Medications:  Current Facility-Administered Medications  Medication Dose Route Frequency Provider Last Rate Last Dose  . acetaminophen (TYLENOL) tablet 650 mg  650 mg Oral Q4H PRN Robinson, [place] N, PA-C      . paliperidone (INVEGA) 24 hr tablet 6 mg  6 mg Oral Daily Robinson, [place] N, PA-C   6 mg at 03/12/18 [id number]   Current Outpatient Medications  Medication Sig Dispense Refill  . paliperidone (INVEGA) 6 MG 24 hr tablet Take 1 tablet (6 mg total) by mouth daily. 30 tablet 0    Musculoskeletal: UTA via camera  Psychiatric Specialty Exam: Physical Exam  Nursing note and vitals reviewed.   Review of Systems  Psychiatric/Behavioral: Positive for substance abuse (UDS positive for marijuana). Negative for depression, hallucinations, memory loss and suicidal ideas. The patient is not nervous/anxious and does not have insomnia.   All other systems reviewed and are negative.   Blood pressure 126/60, pulse 90, temperature 98.1 F (36.7 C), temperature source Oral, resp. rate 18, SpO2 100 %.There is no height or weight on file to calculate BMI.  General Appearance: in hospital scrub  Eye Contact:  Good  Speech:  Clear and Coherent and Normal Rate  Volume:  Normal  Mood:  euthymic  Affect:  Congruent  Thought Process:  Coherent and Goal Directed  Orientation:  Full (Time, Place, and Person)  Thought Content:  WDL  Suicidal Thoughts:  No  Homicidal Thoughts:  No  Memory:  Immediate;   Good Recent;   Good Remote;   Fair  Judgement:  Good  Insight:  Present  Psychomotor Activity:  Normal  Concentration:  Concentration: Good and Attention Span: Good  Recall:  Good  Fund of Knowledge:  Good  Language:  Good  Akathisia:  Negative  Handed:  Right  AIMS (if indicated):     Assets:  Communication Skills Desire for Improvement Housing Leisure Time Physical  Health Social Support  ADL's:  Intact  Cognition:  WNL  Sleep:        Treatment Plan Summary: Plan discharge home  Follow up with outpatient for therapy and medication management Take all medications as prescribed Avoid the use of alcohol and/or drugs Stay well hydrated Activity as tolerated Follow up with PCP for any new or existing medical concerns  Disposition: No evidence of imminent risk to self or others at present.   Patient does not meet criteria for psychiatric inpatient admission. Supportive therapy provided about ongoing stressors. Refer to IOP. Discussed crisis plan, support from social network, calling 911, coming to the Emergency Department, and calling Suicide Hotline.  This service was provided via telemedicine using a 2-way, interactive audio and video technology.  Names of all persons participating in this telemedicine service and their role in this encounter. Name: Cole Barrett Role: Patient  Name: Niel Hummer  Role: NP           Elmarie Shiley, NP 03/12/2018 10:46 AM

## 2018-03-12 NOTE — ED Triage Notes (Signed)
TTS done 03-12-18 

## 2018-03-12 NOTE — ED Notes (Signed)
Pt snoring in bed. NAD.  

## 2018-03-12 NOTE — Progress Notes (Signed)
Resources for Outpatient MH faxed to Psych ED.  Timmothy EulerJean T. Kaylyn LimSutter, MSW, LCSWA Disposition Clinical Social Work [phone] (cell) [phone] (office)

## 2018-03-12 NOTE — ED Triage Notes (Signed)
PT ourt of BR now and returned to room.

## 2018-03-12 NOTE — ED Notes (Signed)
Breakfast tray ordered 

## 2018-03-12 NOTE — ED Notes (Signed)
Pt up to restroom. No complaints.  

## 2018-03-12 NOTE — ED Triage Notes (Signed)
PT instructed he had to come out of BR because he has been in BR for .

## 2018-03-12 NOTE — ED Notes (Signed)
Pt snoring in bed. NAD.

## 2018-03-12 NOTE — ED Triage Notes (Signed)
PT out of room to bath room.

## 2018-03-12 NOTE — ED Notes (Signed)
Snack and drink provided. Was sleeping at snack time. Cooperative and calm.

## 2018-03-27 ENCOUNTER — Encounter (HOSPITAL_COMMUNITY): Payer: Self-pay

## 2018-03-27 ENCOUNTER — Emergency Department (HOSPITAL_COMMUNITY)
Admission: EM | Admit: 2018-03-27 | Discharge: 2018-03-27 | Disposition: A | Discharge status: Home / Self Care | Payer: Self-pay | Attending: Emergency Medicine | Admitting: Emergency Medicine

## 2018-03-27 DIAGNOSIS — F17210 Nicotine dependence, cigarettes, uncomplicated: Secondary | ICD-10-CM | POA: Insufficient documentation

## 2018-03-27 DIAGNOSIS — R440 Auditory hallucinations: Secondary | ICD-10-CM | POA: Insufficient documentation

## 2018-03-27 DIAGNOSIS — R443 Hallucinations, unspecified: Secondary | ICD-10-CM

## 2018-03-27 LAB — COMPREHENSIVE METABOLIC PANEL
ALT: 33 U/L (ref 17–63)
AST: 21 U/L (ref 15–41)
Albumin: 3.8 g/dL (ref 3.5–5.0)
Alkaline Phosphatase: 44 U/L (ref 38–126)
Anion gap: 11 (ref 5–15)
BILIRUBIN TOTAL: 0.4 mg/dL (ref 0.3–1.2)
BUN: 11 mg/dL (ref 6–20)
CHLORIDE: 106 mmol/L (ref 101–111)
CO2: 22 mmol/L (ref 22–32)
CREATININE: 0.9 mg/dL (ref 0.61–1.24)
Calcium: 8.7 mg/dL — ABNORMAL LOW (ref 8.9–10.3)
GFR calc Af Amer: >60 mL/min (ref >60)
Glucose, Bld: 91 mg/dL (ref 65–99)
Potassium: 3.6 mmol/L (ref 3.5–5.1)
Sodium: 139 mmol/L (ref 135–145)
TOTAL PROTEIN: 7.4 g/dL (ref 6.5–8.1)

## 2018-03-27 LAB — SALICYLATE LEVEL: Salicylate Lvl: <7 mg/dL (ref 2.8–30.0)

## 2018-03-27 LAB — CBC
HCT: 37.9 % — ABNORMAL LOW (ref 39.0–52.0)
Hemoglobin: 12.4 g/dL — ABNORMAL LOW (ref 13.0–17.0)
MCH: 28.1 pg (ref 26.0–34.0)
MCHC: 32.7 g/dL (ref 30.0–36.0)
MCV: 85.7 fL (ref 78.0–100.0)
PLATELETS: 300 10*3/uL (ref 150–400)
RBC: 4.42 MIL/uL (ref 4.22–5.81)
RDW: 13.3 % (ref 11.5–15.5)
WBC: 5.5 10*3/uL (ref 4.0–10.5)

## 2018-03-27 LAB — ETHANOL

## 2018-03-27 LAB — ACETAMINOPHEN LEVEL: Acetaminophen (Tylenol), Serum: <10 ug/mL — ABNORMAL LOW (ref 10–30)

## 2018-03-27 MED ORDER — PALIPERIDONE ER 6 MG PO TB24
6.0000 mg | ORAL_TABLET | Freq: Every day | ORAL | Status: DC
  Administered 2018-03-27: 6 mg via ORAL
  Filled 2018-03-27: qty 1

## 2018-03-27 NOTE — ED Notes (Signed)
Since being told that he is discharged, pt has been in the bathroom and seems to be procrastinating. He verbalizes, "I am using the bathroom."  Because it has been so long this Clinical research associate has informed pt that in 5 minutes security will be opening the bathroom door to check on him. He said, "I am wiping my ass."

## 2018-03-27 NOTE — ED Notes (Signed)
Pt discharged home. Discharged instructions read to pt who was not interested and did not participate in teach back.  All belongings returned to pt. Denies SI/HI, is not delusional and not responding to internal stimuli. Escorted pt to the ED exit.

## 2018-03-27 NOTE — Progress Notes (Signed)
Patient ID: Cole Barrett, male   DOB: 1985-12-16, 32 y.o.   MRN: [id number] Per chart review, Donell Sievert, PA-C recommends discharge with outpatient resources. Pt has been in the ED 20 times in the past six months with complaint of auditory hallucinations. Pt is at baseline and stable for discharge. Pt will be referred to [facility] [place] for medication management.   Laveda Abbe NP-C [phone]       03-27-2018

## 2018-03-27 NOTE — Discharge Instructions (Signed)
For your behavioral health needs you are advised to follow up with [facility].  New patients are seen at their walk-in clinic.  Walk-in hours are Monday - Friday from 8:00 am - 12:00 pm, and from 1:00 pm - 3:00 pm.  Walk-in patients are seen on a first come, first served basis, so try to arrive as early as possible for the best chance of being seen the same day.  There is an initial fee of $22.50: ° °     [facility] °     [address] °     [place], [place] [address] °     [phone] °

## 2018-03-27 NOTE — ED Notes (Signed)
Patient arrived to unit and is bright and smiling on approach. Pt states "Can you put in a good word for me to go to the [address]? Tell the doctor I really want to go back there to stay for a while". Pt denies suicidal or homicidal thoughts at this time, but states he is hearing voices. No distress noted currently; pt eating snack and drinking soda in his room. No complaints at this time.

## 2018-03-27 NOTE — ED Provider Notes (Signed)
[facility]-EMERGENCY DEPT Provider Note   CSN: [id number] Arrival date & time: 03/27/18  0009     History   Chief Complaint Chief Complaint  Patient presents with  . hallucinating    HPI Cole Barrett is a 32 y.o. male.  HPI 32 yo AA male pmh sig for homelessness, schizophrenia that presents to the Ed for eval of auditory hallucinations. States that today he started hearing voices that were telling him that they were going to hurt him. He denies any si or hi. Pt states he has not ben able to get his medications filled and has not been taking them for the past 2 weeks. Pt is requesting admission to behavioral hospital. Pt denies any alcohol or drug use. Reports daily tobacco use. Pt denies any other medical complaints at this time.  Past Medical History:  Diagnosis Date  . Homelessness   . Hypercholesterolemia   . Mental disorder   . Paranoid behavior (HCC)   . PE (pulmonary embolism)   . Schizophrenia [facility])     Patient Active Problem List   Diagnosis Date Noted  . Schizophrenia (HCC) 12/24/2017  . 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 Schizophrenia, paranoid (HCC)   . 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 MDD (major depressive disorder) 09/22/2017  . Alcohol abuse   . Auditory hallucinations   . Alcohol abuse with alcohol-induced mood disorder (HCC) 09/08/2016  . Adjustment disorder with depressed mood 09/07/2016  . Homelessness 09/03/2016  . Person feigning illness 09/03/2016  . 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 Cannabis use disorder, mild, abuse 08/29/2016  . Pulmonary emboli (HCC) 07/13/2016  . 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 Acute left flank pain 01/02/2014  . Pulmonary embolism, bilateral (HCC) 01/02/2014    History reviewed. No pertinent surgical history.      Home Medications    Prior to Admission  medications   Medication Sig Start Date End Date Taking? Authorizing Provider  paliperidone (INVEGA) 6 MG 24 hr tablet Take 1 tablet (6 mg total) by mouth daily. 03/06/18   Laveda Abbe, NP    Family History Family History  Problem Relation Age of Onset  . Hypertension Mother     Social History Social History   Tobacco Use  . Smoking status: Current Every Day Smoker    Packs/day: 0.50    Types: Cigarettes  . Smokeless tobacco: Never Used  Substance Use Topics  . Alcohol use: Yes    Comment: Last drink: yesterday   . Drug use: Yes    Types: Marijuana    Comment: Last used: 2 weeks ago     Allergies   Haldol [haloperidol]   Review of Systems Review of Systems  All other systems reviewed and are negative.    Physical Exam Updated Vital Signs BP 122/84 (BP Location: Left Arm)   Pulse 87   Temp 97.9 F (36.6 C) (Oral)   Resp 15   Ht  (1.778 m)   Wt 106.5 kg (234 lb 12.8 oz)   SpO2 97%   BMI 33.69 kg/m   Physical Exam  Constitutional: He is oriented to person, place, and time. He appears well-developed and well-nourished. No distress.  HENT:  Head: Normocephalic and atraumatic.  Eyes: Right eye exhibits no discharge. Left eye exhibits no discharge. No scleral icterus.  Neck: Normal range of motion. Neck supple.  Cardiovascular: Normal rate, regular rhythm, normal heart  sounds and intact distal pulses. Exam reveals no gallop and no friction rub.  No murmur heard. Pulmonary/Chest: Effort normal and breath sounds normal. No stridor. No respiratory distress. He has no wheezes. He has no rales. He exhibits no tenderness.  Musculoskeletal: Normal range of motion.  Neurological: He is alert and oriented to person, place, and time.  Skin: Skin is dry. No pallor.  Psychiatric: He has a normal mood and affect. His speech is normal and behavior is normal. Judgment normal. Thought content is paranoid. He expresses no homicidal and no suicidal ideation. He  expresses no suicidal plans and no homicidal plans.  Nursing note and vitals reviewed.    ED Treatments / Results  Labs (all labs ordered are listed, but only abnormal results are displayed) Labs Reviewed  COMPREHENSIVE METABOLIC PANEL - Abnormal; Notable for the following components:      Result Value   Calcium 8.7 (*)    All other components within normal limits  ACETAMINOPHEN LEVEL - Abnormal; Notable for the following components:   Acetaminophen (Tylenol), Serum <10 (*)    All other components within normal limits  CBC - Abnormal; Notable for the following components:   Hemoglobin 12.4 (*)    HCT 37.9 (*)    All other components within normal limits  ETHANOL  SALICYLATE LEVEL  RAPID URINE DRUG SCREEN, HOSP PERFORMED    EKG None  Radiology No results found.  Procedures Procedures (including critical care time)  Medications Ordered in ED Medications - No data to display   Initial Impression / Assessment and Plan / ED Course  I have reviewed the triage vital signs and the nursing notes.  Pertinent labs & imaging results that were available during my care of the patient were reviewed by me and considered in my medical decision making (see chart for details).     Patient presents to the emergency department today for auditory hallucinations.  Does not appear to be responding to internal stimuli.  Patient has been off his medication for the past 2 weeks.  Or suicidal ideations.  Lab work, vital signs and physical exam is reassuring.  She can be medically cleared for TTS evaluation disposition.  Final Clinical Impressions(s) / ED Diagnoses   Final diagnoses:  Hallucinations    ED Discharge Orders    None       Wallace Keller 03/27/18 0445    Shon Baton, MD 03/27/18 [phone]

## 2018-03-27 NOTE — BH Assessment (Signed)
[facility] Assessment Progress Note  Per Donell Sievert, PA, this pt does not require psychiatric hospitalization at this time.  Pt is to be discharged from [facility].  He reports that he is prohibited from returning to [place]. Discharge instructions advise pt to follow up with [facility].  Pt's nurse, Diane, has been notified.  Doylene Canning, MA Triage Specialist [phone]

## 2018-03-27 NOTE — ED Triage Notes (Signed)
Pt complains of hearing voices and being off his meds for about two weeks Pt denies SI

## 2018-03-27 NOTE — BH Assessment (Signed)
Assessment Note  Cole Barrett is an 32 y.o. male.  The pt came in due to hearing voices.  This is the pt's 20 th ED visit in the past 6 months.  He stated he is hearing a voice saying it is going to kill him.  The pt denies current or past SI.  He described his primary stressor as his voices.  The pt is currently not seeing a counselor or psychiatrist.  He was going to [place] and stated he doesn't want to go back there again.  The pt was encouraged to start getting out patient treatment.  He is currently working and lives alone.  The pt denied any legal issues or upcoming court dates.  However, according to [place] court calendar, the pt was supposed to go to court yesterday for trespassing and having an open container.  The pt denies HI.  He reports he is sleeping and eating well.  Diagnosis: F20.9 Schizophrenia   Past Medical History:  Past Medical History:  Diagnosis Date  . Homelessness   . Hypercholesterolemia   . Mental disorder   . Paranoid behavior (HCC)   . PE (pulmonary embolism)   . Schizophrenia (HCC)     History reviewed. No pertinent surgical history.  Family History:  Family History  Problem Relation Age of Onset  . Hypertension Mother     Social History:  reports that he has been smoking cigarettes.  He has been smoking about 0.50 packs per day. He has never used smokeless tobacco. He reports that he drinks alcohol. He reports that he has current or past drug history. Drug: Marijuana.  Additional Social History:  Alcohol / Drug Use Pain Medications: See MAR Prescriptions: See MAR Over the Counter: See MAR History of alcohol / drug use?: No history of alcohol / drug abuse Longest period of sobriety (when/how long): NA  CIWA: CIWA-Ar BP: 122/84 Pulse Rate: 87 COWS:    Allergies:  Allergies  Allergen Reactions  . Haldol [Haloperidol] Shortness Of Breath    Home Medications:  (Not in a hospital admission)  OB/GYN Status:  No LMP for  male patient.  General Assessment Data Location of Assessment: [facility] ED TTS Assessment: In system Is this a Tele or Face-to-Face Assessment?: Face-to-Face Is this an Initial Assessment or a Re-assessment for this encounter?: Initial Assessment Marital status: Single Maiden name: NA Is patient pregnant?: Other (Comment)(male) Living Arrangements: Alone Can pt return to current living arrangement?: Yes Admission Status: Voluntary Is patient capable of signing voluntary admission?: Yes Referral Source: Self/Family/Friend Insurance type: Self Pay     Crisis Care Plan Living Arrangements: Alone Legal Guardian: Other:(Self) Name of Psychiatrist: none Name of Therapist: none  Education Status Is patient currently in school?: No Is the patient employed, unemployed or receiving disability?: Employed  Risk to self with the past 6 months Suicidal Ideation: No Has patient been a risk to self within the past 6 months prior to admission? : No Suicidal Intent: No Has patient had any suicidal intent within the past 6 months prior to admission? : No Is patient at risk for suicide?: No Suicidal Plan?: No Has patient had any suicidal plan within the past 6 months prior to admission? : No Access to Means: No What has been your use of drugs/alcohol within the last 12 months?: none Previous Attempts/Gestures: No How many times?: 0 Other Self Harm Risks: 0 Triggers for Past Attempts: None known Intentional Self Injurious Behavior: None Family Suicide History: No Recent stressful  life event(s): Other (Comment)(voices) Persecutory voices/beliefs?: Yes Depression: No Substance abuse history and/or treatment for substance abuse?: Yes Suicide prevention information given to non-admitted patients: Not applicable  Risk to Others within the past 6 months Homicidal Ideation: No Does patient have any lifetime risk of violence toward others beyond the six months prior to admission? : No Thoughts of  Harm to Others: No Current Homicidal Intent: No Current Homicidal Plan: No Access to Homicidal Means: No Identified Victim: NA History of harm to others?: No Assessment of Violence: None Noted Violent Behavior Description: NA Does patient have access to weapons?: No Criminal Charges Pending?: Yes Describe Pending Criminal Charges: trespassing and pen container Does patient have a court date: Yes Court Date: 04/05/18 Is patient on probation?: Unknown  Psychosis Hallucinations: Auditory Delusions: None noted  Mental Status Report Appearance/Hygiene: In scrubs, Unremarkable Eye Contact: Good Motor Activity: Unable to assess Speech: Logical/coherent Level of Consciousness: Alert Mood: Pleasant Affect: Appropriate to circumstance Anxiety Level: None Thought Processes: Coherent, Relevant Judgement: Unimpaired Orientation: Place, Person, Situation Obsessive Compulsive Thoughts/Behaviors: None  Cognitive Functioning Concentration: Normal Memory: Recent Intact, Remote Intact Is patient IDD: No Is patient DD?: No Insight: Poor Impulse Control: Fair Appetite: Good Have you had any weight changes? : No Change Amount of the weight change? (lbs): 0 lbs Sleep: No Change Total Hours of Sleep: 8 Vegetative Symptoms: None  ADLScreening [facility] Assessment Services) Patient's cognitive ability adequate to safely complete daily activities?: Yes Patient able to express need for assistance with ADLs?: Yes Independently performs ADLs?: Yes (appropriate for developmental age)  Prior Inpatient Therapy Prior Inpatient Therapy: Yes Prior Therapy Dates: January 2019; 10/2017; 08/2017 Prior Therapy Facilty/Provider(s): [facility] Reason for Treatment: hallucinations  Prior Outpatient Therapy Prior Outpatient Therapy: No Prior Therapy Dates: 2018 Prior Therapy Facilty/Provider(s): [facility] Reason for Treatment: Schizophrenia Does patient have an ACCT team?: No Does patient have Intensive  In-House Services?  : No Does patient have [facility] services? : No Does patient have P4CC services?: No  ADL Screening (condition at time of admission) Patient's cognitive ability adequate to safely complete daily activities?: Yes Patient able to express need for assistance with ADLs?: Yes Independently performs ADLs?: Yes (appropriate for developmental age)       Abuse/Neglect Assessment (Assessment to be complete while patient is alone) Abuse/Neglect Assessment Can Be Completed: Yes Physical Abuse: Denies Verbal Abuse: Denies Sexual Abuse: Denies Exploitation of patient/patient's resources: Denies Self-Neglect: Denies Values / Beliefs Cultural Requests During Hospitalization: None Spiritual Requests During Hospitalization: None Consults Spiritual Care Consult Needed: No Social Work Consult Needed: No            Disposition:  Disposition Initial Assessment Completed for this Encounter: Yes Patient referred to: Outpatient clinic referral   PA Donell Sievert recommends the pt be discharged with out patient resources.  RN Marcelino Duster was made aware of the recommendations.  On Site Evaluation by:   Reviewed with Physician:    Ottis Stain 03/27/2018 5:37 AM

## 2018-03-30 ENCOUNTER — Encounter (HOSPITAL_COMMUNITY): Payer: Self-pay | Admitting: Emergency Medicine

## 2018-03-30 ENCOUNTER — Emergency Department (HOSPITAL_COMMUNITY)
Admission: EM | Admit: 2018-03-30 | Discharge: 2018-03-30 | Disposition: A | Discharge status: Home / Self Care | Payer: Self-pay | Attending: Emergency Medicine | Admitting: Emergency Medicine

## 2018-03-30 DIAGNOSIS — R440 Auditory hallucinations: Secondary | ICD-10-CM | POA: Insufficient documentation

## 2018-03-30 DIAGNOSIS — F17210 Nicotine dependence, cigarettes, uncomplicated: Secondary | ICD-10-CM | POA: Insufficient documentation

## 2018-03-30 DIAGNOSIS — Z79899 Other long term (current) drug therapy: Secondary | ICD-10-CM | POA: Insufficient documentation

## 2018-03-30 DIAGNOSIS — R443 Hallucinations, unspecified: Secondary | ICD-10-CM

## 2018-03-30 LAB — CBC
HCT: 36.7 % — ABNORMAL LOW (ref 39.0–52.0)
HEMOGLOBIN: 12.5 g/dL — AB (ref 13.0–17.0)
MCH: 28.8 pg (ref 26.0–34.0)
MCHC: 34.1 g/dL (ref 30.0–36.0)
MCV: 84.6 fL (ref 78.0–100.0)
Platelets: 269 10*3/uL (ref 150–400)
RBC: 4.34 MIL/uL (ref 4.22–5.81)
RDW: 13.2 % (ref 11.5–15.5)
WBC: 4.4 10*3/uL (ref 4.0–10.5)

## 2018-03-30 NOTE — ED Triage Notes (Signed)
Pt reports he is hearing voices, will not state what the voices are stating but denies SI/HI. Pt seen frequently for same. States all he cares about is being placed at [facility].

## 2018-03-30 NOTE — ED Provider Notes (Signed)
TIME SEEN: 4:07 AM  CHIEF COMPLAINT: Hallucinations  HPI: Patient is a 32 year old male with history of schizophrenia, homelessness who presented to the emergency department complaining of auditory hallucinations.  Patient frequently presents to the ED with complaints of the same.  Has been here 21 times in the past 6 months.  Was just seen on April 30 by behavioral health and deemed not an inpatient candidate and given outpatient resources.  It does appear that he recently missed a court date and that may be why he is coming back to the emergency department.  He denies of these auditory hallucinations.  He denies SI or HI.  He states he feels he needs admission to behavioral health hospital but cannot tell me why.  ROS: See HPI Constitutional: no fever  Eyes: no drainage  ENT: no runny nose   Cardiovascular:  no chest pain  Resp: no SOB  GI: no vomiting GU: no dysuria Integumentary: no rash  Allergy: no hives  Musculoskeletal: no leg swelling  Neurological: no slurred speech ROS otherwise negative  PAST MEDICAL HISTORY/PAST SURGICAL HISTORY:  Past Medical History:  Diagnosis Date  . Homelessness   . Hypercholesterolemia   . Mental disorder   . Paranoid behavior (HCC)   . PE (pulmonary embolism)   . Schizophrenia (HCC)     MEDICATIONS:  Prior to Admission medications   Medication Sig Start Date End Date Taking? Authorizing Provider  paliperidone (INVEGA) 6 MG 24 hr tablet Take 1 tablet (6 mg total) by mouth daily. 03/06/18   Laveda Abbe, NP    ALLERGIES:  Allergies  Allergen Reactions  . Haldol [Haloperidol] Shortness Of Breath    SOCIAL HISTORY:  Social History   Tobacco Use  . Smoking status: Current Every Day Smoker    Packs/day: 0.50    Types: Cigarettes  . Smokeless tobacco: Never Used  Substance Use Topics  . Alcohol use: Yes    Comment: Last drink: yesterday     FAMILY HISTORY: Family History  Problem Relation Age of Onset  . Hypertension  Mother     EXAM: BP 112/71 (BP Location: Right Arm)   Pulse 89   Temp 98.8 F (37.1 C) (Oral)   Resp 18   Ht  (1.778 m)   Wt 106.6 kg (235 lb)   SpO2 99%   BMI 33.72 kg/m  CONSTITUTIONAL: Alert and oriented and responds appropriately to questions. Well-appearing; well-nourished HEAD: Normocephalic EYES: Conjunctivae clear, pupils appear equal, EOMI ENT: normal nose; moist mucous membranes NECK: Supple, no meningismus, no nuchal rigidity, no LAD  CARD: RRR; S1 and S2 appreciated; no murmurs, no clicks, no rubs, no gallops RESP: Normal chest excursion without splinting or tachypnea; breath sounds clear and equal bilaterally; no wheezes, no rhonchi, no rales, no hypoxia or respiratory distress, speaking full sentences ABD/GI: Normal bowel sounds; non-distended; soft, non-tender, no rebound, no guarding, no peritoneal signs, no hepatosplenomegaly BACK:  The back appears normal and is non-tender to palpation, there is no CVA tenderness EXT: Normal ROM in all joints; non-tender to palpation; no edema; normal capillary refill; no cyanosis, no calf tenderness or swelling    SKIN: Normal color for age and race; warm; no rash NEURO: Moves all extremities equally PSYCH: Doris is auditory hallucinations but denies that they are command hallucinations.  Denies SI or HI.  MEDICAL DECISION MAKING: Patient here with hallucinations.  He has a chronic history of the same.  At this time I do not feel he meets any  inpatient criteria for psychiatric admission.  I feel that patient is somewhat malingering and this is likely secondary to his history of homelessness and recent missed court date.  I do not feel he needs emergent psychiatric evaluation.  He was just seen by TTS on April 30.  He was discharged with outpatient resources.  States he has not followed up with any of these resources.  I have provided him another list of resources and I feel he can be discharged.  At this time, I do not feel  there is any life-threatening condition present. I have reviewed and discussed all results (EKG, imaging, lab, urine as appropriate) and exam findings with patient/family. I have reviewed nursing notes and appropriate previous records.  I feel the patient is safe to be discharged home without further emergent workup and can continue workup as an outpatient as needed. Discussed usual and customary return precautions. Patient/family verbalize understanding and are comfortable with this plan.  Outpatient follow-up has been provided if needed. All questions have been answered.     Tanis Hensarling, Layla Maw, DO 03/30/18 [phone]

## 2018-03-30 NOTE — ED Notes (Signed)
EDP in triage to DC pt

## 2018-03-31 IMAGING — CR DG CHEST 2V
2 series · 2 of 2 positions shown · non-contrast
Comparison: Chest radiograph July 15, 2016 and CT chest July 13, 2016

CLINICAL DATA: LEFT-sided chest pain tonight. History of pulmonary
embolism and infarction.

EXAM:
CHEST  2 VIEW

[chest pa]
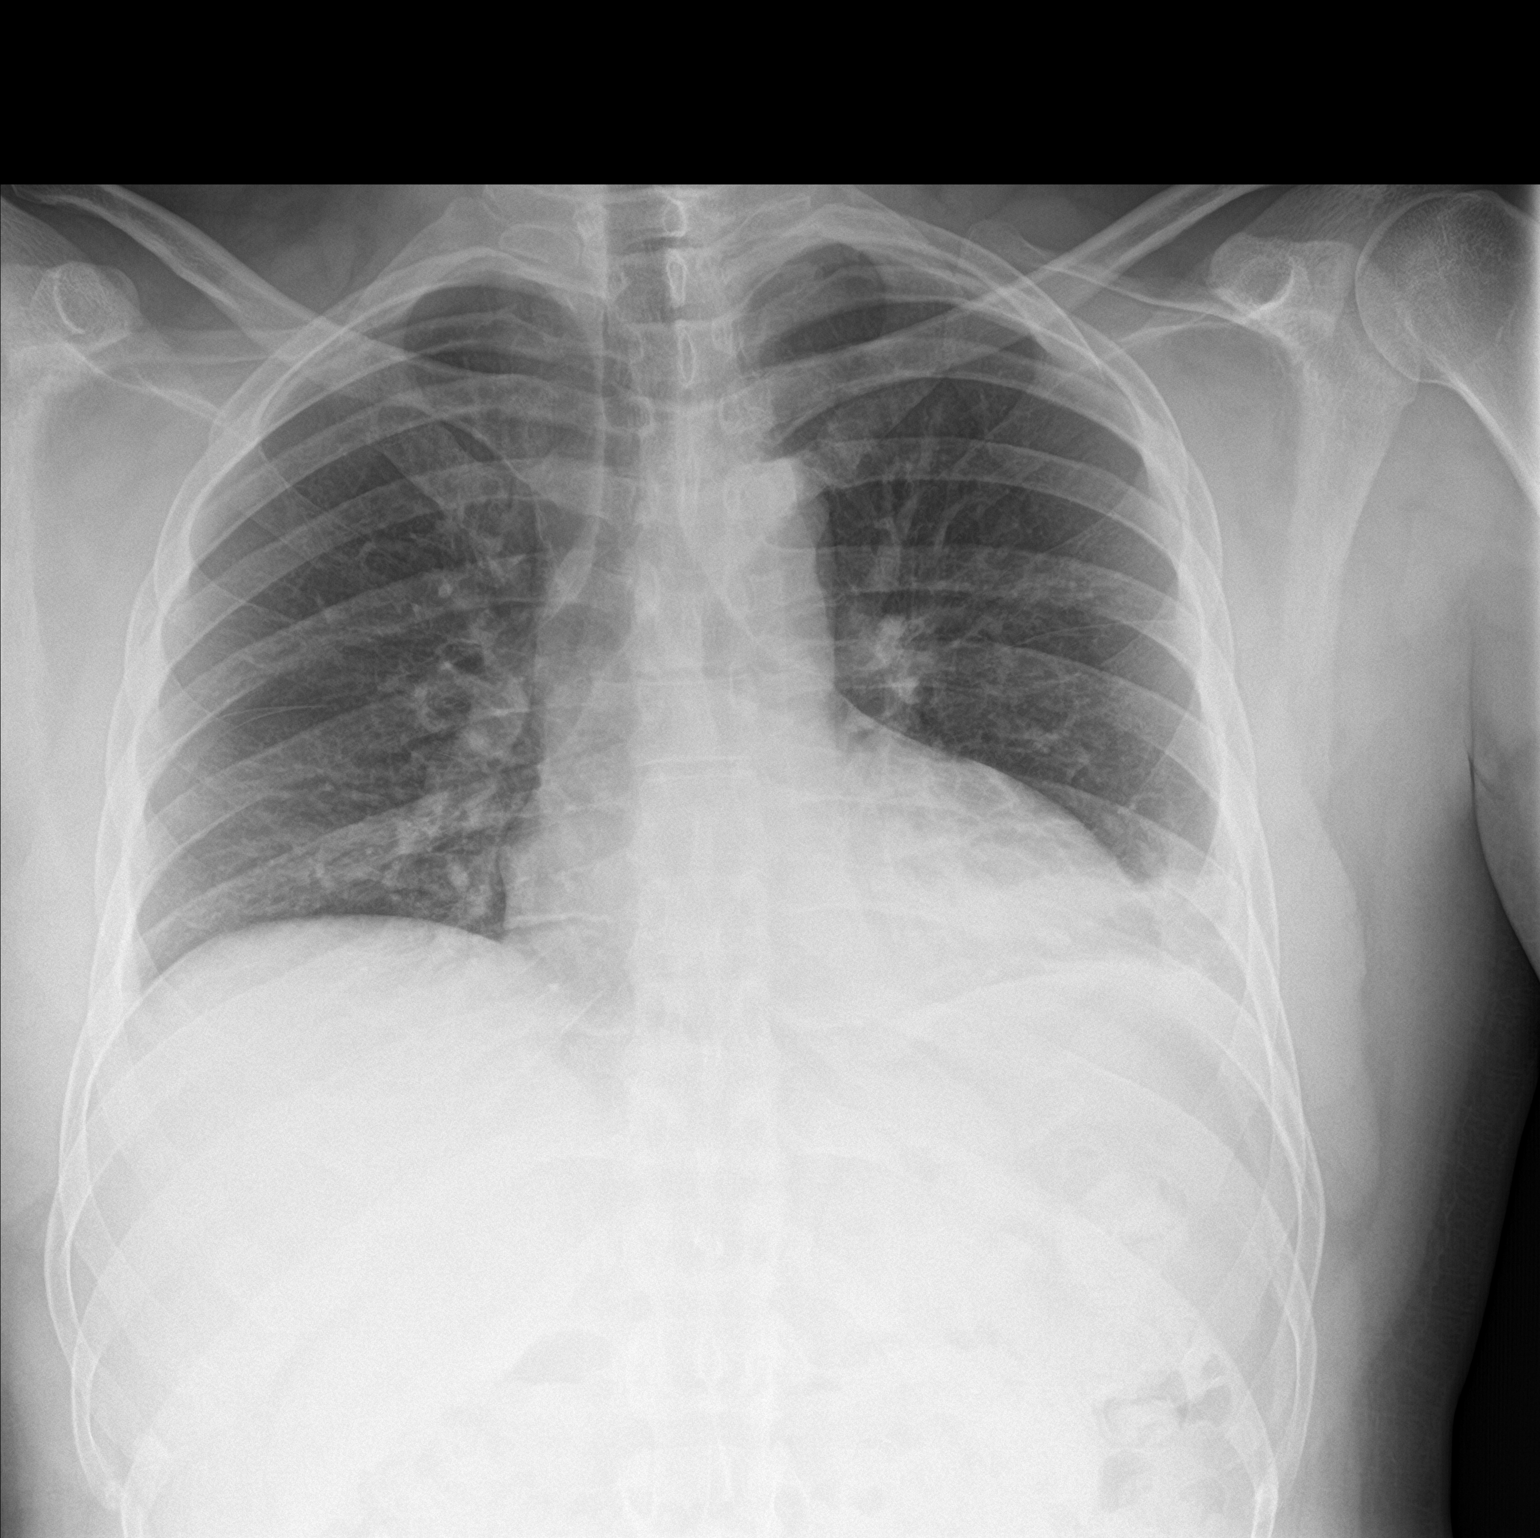

[chest lat]
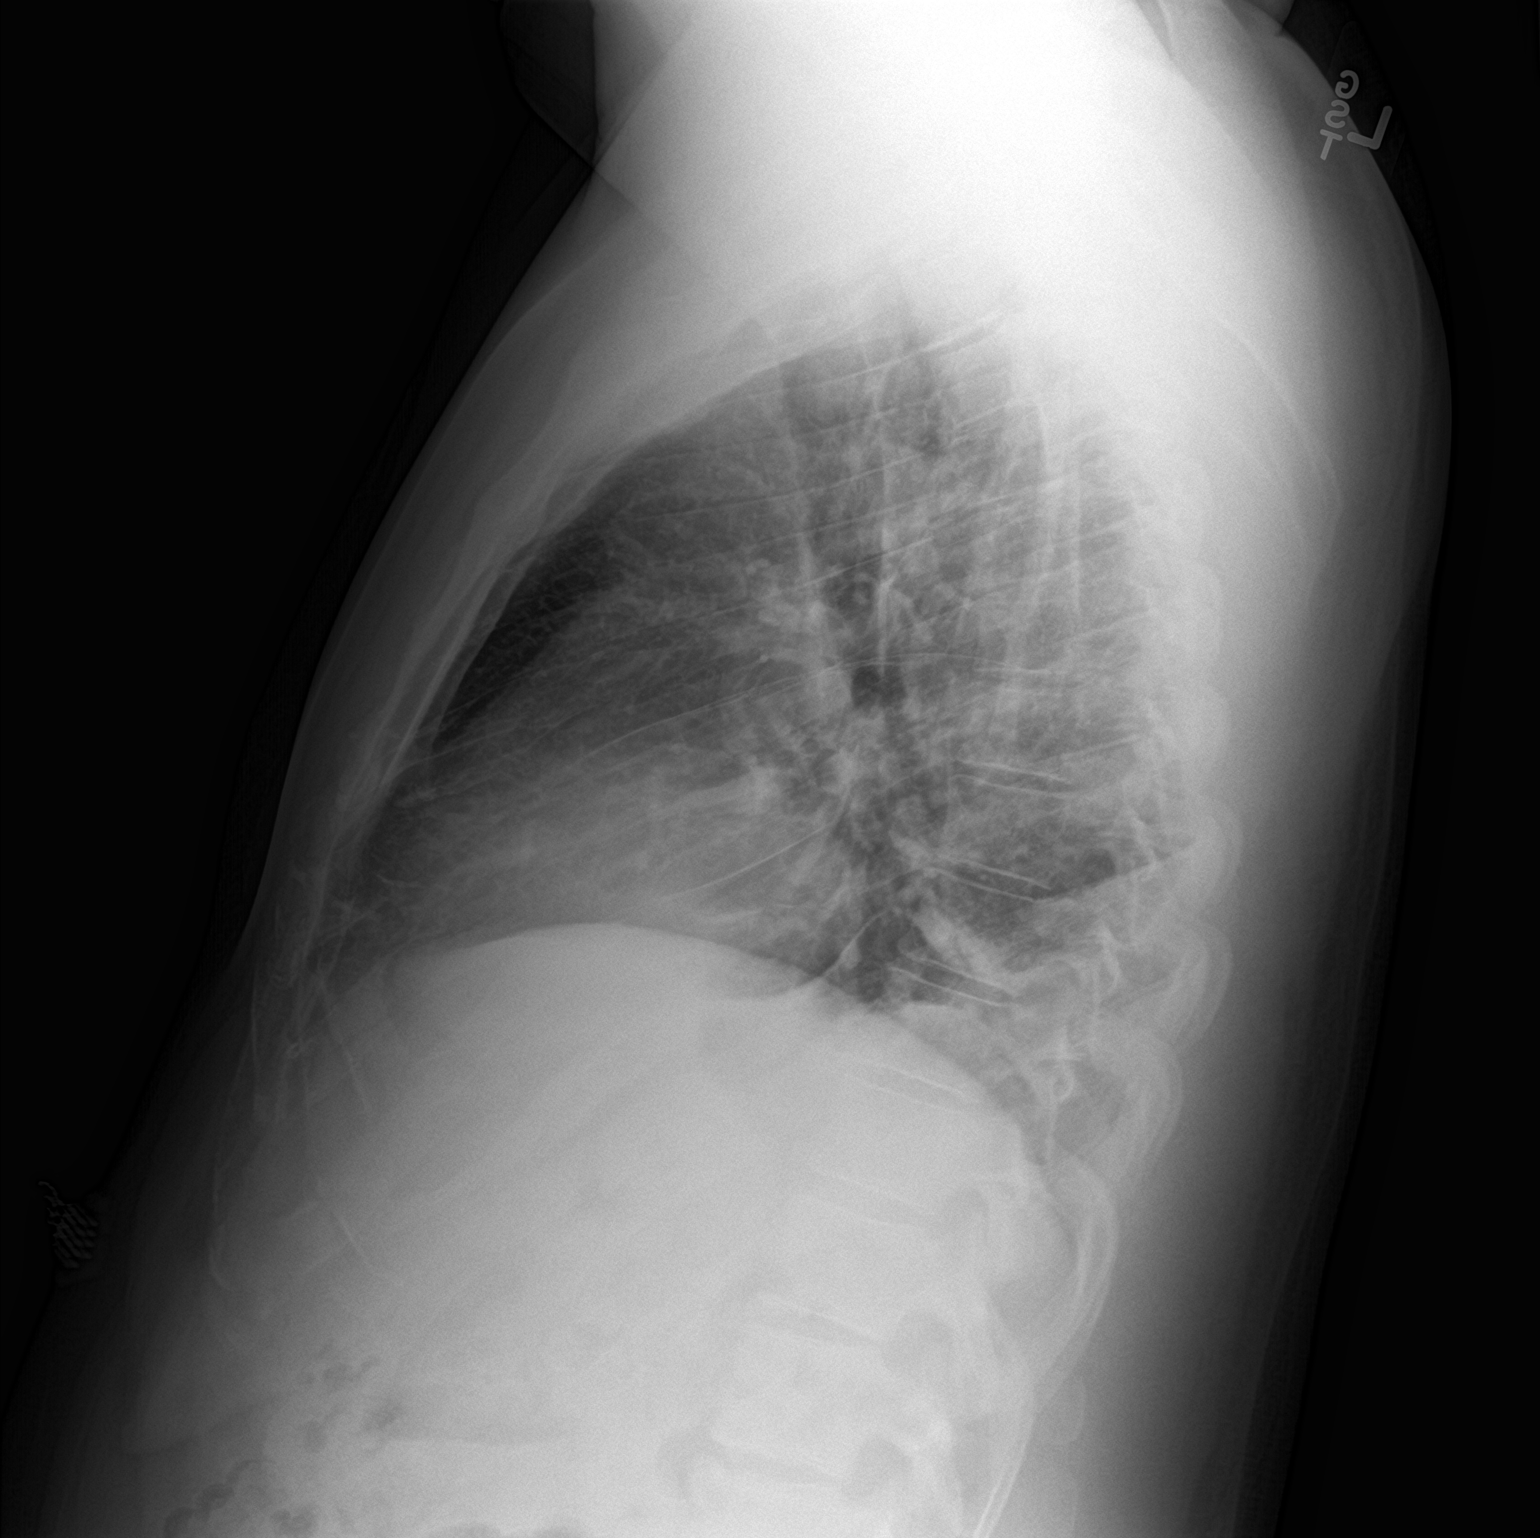

[2 of 2 positions shown; findings below may reference images not displayed]

FINDINGS: Increasing focal consolidation LEFT lower lobe. Cardiac silhouette
is upper limits of normal in size, mediastinal silhouette is
nonsuspicious. No pleural effusion. No pneumothorax. Soft tissue
planes and included osseous structures are normal.
IMPRESSION: Increasing consolidation LEFT lower lobe suspicious for pneumonia,
less likely pulmonary infarct given patient's history of pulmonary
embolism.

## 2018-04-11 ENCOUNTER — Emergency Department (HOSPITAL_COMMUNITY)
Admission: EM | Admit: 2018-04-11 | Discharge: 2018-04-11 | Disposition: A | Discharge status: Home / Self Care | Payer: Self-pay | Attending: Emergency Medicine | Admitting: Emergency Medicine

## 2018-04-11 ENCOUNTER — Encounter (HOSPITAL_COMMUNITY): Payer: Self-pay

## 2018-04-11 DIAGNOSIS — Z79899 Other long term (current) drug therapy: Secondary | ICD-10-CM | POA: Insufficient documentation

## 2018-04-11 DIAGNOSIS — F17210 Nicotine dependence, cigarettes, uncomplicated: Secondary | ICD-10-CM | POA: Insufficient documentation

## 2018-04-11 DIAGNOSIS — R443 Hallucinations, unspecified: Secondary | ICD-10-CM

## 2018-04-11 DIAGNOSIS — F209 Schizophrenia, unspecified: Secondary | ICD-10-CM | POA: Insufficient documentation

## 2018-04-11 DIAGNOSIS — Z008 Encounter for other general examination: Secondary | ICD-10-CM | POA: Insufficient documentation

## 2018-04-11 LAB — CBC
HCT: 40 % (ref 39.0–52.0)
HEMOGLOBIN: 13.3 g/dL (ref 13.0–17.0)
MCH: 28.2 pg (ref 26.0–34.0)
MCHC: 33.3 g/dL (ref 30.0–36.0)
MCV: 84.9 fL (ref 78.0–100.0)
Platelets: 322 10*3/uL (ref 150–400)
RBC: 4.71 MIL/uL (ref 4.22–5.81)
RDW: 13.1 % (ref 11.5–15.5)
WBC: 5 10*3/uL (ref 4.0–10.5)

## 2018-04-11 LAB — COMPREHENSIVE METABOLIC PANEL
ALBUMIN: 4 g/dL (ref 3.5–5.0)
ALT: 38 U/L (ref 17–63)
AST: 23 U/L (ref 15–41)
Alkaline Phosphatase: 45 U/L (ref 38–126)
Anion gap: 12 (ref 5–15)
BUN: 8 mg/dL (ref 6–20)
CHLORIDE: 104 mmol/L (ref 101–111)
CO2: 23 mmol/L (ref 22–32)
Calcium: 9 mg/dL (ref 8.9–10.3)
Creatinine, Ser: 0.86 mg/dL (ref 0.61–1.24)
GFR calc Af Amer: >60 mL/min (ref >60)
GFR calc non Af Amer: >60 mL/min (ref >60)
Glucose, Bld: 97 mg/dL (ref 65–99)
POTASSIUM: 3.8 mmol/L (ref 3.5–5.1)
SODIUM: 139 mmol/L (ref 135–145)
Total Bilirubin: 0.7 mg/dL (ref 0.3–1.2)
Total Protein: 7.6 g/dL (ref 6.5–8.1)

## 2018-04-11 LAB — SALICYLATE LEVEL: Salicylate Lvl: <7 mg/dL (ref 2.8–30.0)

## 2018-04-11 LAB — ACETAMINOPHEN LEVEL: Acetaminophen (Tylenol), Serum: <10 ug/mL — ABNORMAL LOW (ref 10–30)

## 2018-04-11 LAB — ETHANOL: Alcohol, Ethyl (B): 111 mg/dL — ABNORMAL HIGH (ref <10)

## 2018-04-11 NOTE — ED Triage Notes (Signed)
Pt states that he woke up and the voices were telling him they were going to kill him, he said he was scared to go to work and was going to try to see if they would go away Pt states that he hasn't been on medications in awhile and thought he could do without them

## 2018-04-11 NOTE — Discharge Instructions (Signed)
Follow-up with one of the outpatient clinics for help with your hallucinations. Return here for any new/acute changes.

## 2018-04-11 NOTE — BH Assessment (Addendum)
Assessment Note  Cole Barrett is an 32 y.o. male, who presents voluntary and unaccompanied to [facility]. This is the pt's 22nd ED visit in the past six months for a similar presentation. Pt was a poor historian during the assessment, pt was prompted to engage. Clinician asked the pt, "what brought you to the hospital?" Pt reported, "hearing voices, all day, saying they gonna kill me." Pt reported, "I'm trying to get help, I need to get to behavioral health." Pt denies, SI, HI, self-injurious behaviors and access to weapons.   Pt denies abuse. Pt initially denied using substances until clinician asked how much he have to drink due to his elevated BAL. Clinician disclosed the pt's BAL, pt asked, "is that bad?" Pt reported, drinking earlier. Pt's BAL was 111 at 0141. Pt denies, being linked to linked to OPT resources (medication management and/or counseling.) Per chart, pt was linked to [facility] for medication management. Pt reported, previous inpatient admissions.  Pt presents sleeping in scrubs with slurred speech. Pt's eye contact was poor. Pt's mood was helpless. Pt's affect was flat. Pt's thought process was circumstantial. Pt's judgement was partial. Pt's concentration, insight and impulse control are poor. Pt's reported, if discharged from [facility] he could contract for safety. Pt reported, if inpatient treatment is recommended he would sign-in voluntarily.   Diagnosis: F20.9 Schizophrenia.  Past Medical History:  Past Medical History:  Diagnosis Date  . Homelessness   . Hypercholesterolemia   . Mental disorder   . Paranoid behavior (HCC)   . PE (pulmonary embolism)   . Schizophrenia (HCC)     History reviewed. No pertinent surgical history.  Family History:  Family History  Problem Relation Age of Onset  . Hypertension Mother     Social History:  reports that he has been smoking cigarettes.  He has been smoking about 0.50 packs per day. He has never used smokeless tobacco. He  reports that he drinks alcohol. He reports that he has current or past drug history. Drug: Marijuana.  Additional Social History:  Alcohol / Drug Use Pain Medications: See MAR Prescriptions: See MAR Over the Counter: See MAR History of alcohol / drug use?: Yes Substance #1 Name of Substance 1: Alcohol.  1 - Age of First Use: Per chart, "teenage."  1 - Amount (size/oz): Pt reported, drinking earlier.  Pt's BAL is 111 at 0141. 1 - Frequency: Per chart, "every other day."  1 - Duration: Ongoing.  1 - Last Use / Amount: Pt reported, earlier today.   CIWA: CIWA-Ar BP: 125/76 Pulse Rate: 90 COWS:    Allergies:  Allergies  Allergen Reactions  . Haldol [Haloperidol] Shortness Of Breath    Home Medications:  (Not in a hospital admission)  OB/GYN Status:  No LMP for male patient.  General Assessment Data [facility]: [facility] TTS Assessment: In system Is this a Tele or Face-to-Face Assessment?: Face-to-Face Is this an Initial Assessment or a Re-assessment for this encounter?: Initial Assessment Marital status: Single Living Arrangements: Alone Can pt return to current living arrangement?: Yes Admission Status: Voluntary Is patient capable of signing voluntary admission?: Yes Referral Source: Self/Family/Friend Insurance type: Self-pay.      Crisis Care Plan Living Arrangements: Alone Legal Guardian: Other:(Self. ) Name of Psychiatrist: NA Name of Therapist: NA  Education Status Is patient currently in school?: No Is the patient employed, unemployed or receiving disability?: Unemployed  Risk to self with the past 6 months Suicidal Ideation: No(Pt denies. ) Has patient been a  risk to self within the past 6 months prior to admission? : No Suicidal Intent: No Has patient had any suicidal intent within the past 6 months prior to admission? : No Is patient at risk for suicide?: No Suicidal Plan?: No Has patient had any suicidal plan within the past 6 months  prior to admission? : No Access to Means: No What has been your use of drugs/alcohol within the last 12 months?: Alcohol.  Previous Attempts/Gestures: No How many times?: 0 Other Self Harm Risks: Pt denies.  Triggers for Past Attempts: None known Intentional Self Injurious Behavior: None(Pt denies. ) Family Suicide History: No Recent stressful life event(s): Other (Comment)(hearing voices.) Persecutory voices/beliefs?: Yes Depression: No(Pt denies. ) Depression Symptoms: (Pt denies. ) Substance abuse history and/or treatment for substance abuse?: Yes Suicide prevention information given to non-admitted patients: Not applicable  Risk to Others within the past 6 months Homicidal Ideation: No(Pt denies. ) Does patient have any lifetime risk of violence toward others beyond the six months prior to admission? : No Thoughts of Harm to Others: No Current Homicidal Intent: No Current Homicidal Plan: No Access to Homicidal Means: No Identified Victim: NA History of harm to others?: No Assessment of Violence: None Noted Violent Behavior Description: NA Does patient have access to weapons?: No(Pt denies. ) Criminal Charges Pending?: No(Pt denies. ) Does patient have a court date: No(Pt denies. ) Is patient on probation?: No(Pt denies. )  Psychosis Hallucinations: Auditory Delusions: None noted  Mental Status Report Appearance/Hygiene: In scrubs Eye Contact: Poor Motor Activity: Unremarkable Speech: Logical/coherent Level of Consciousness: Sleeping Anxiety Level: None Thought Processes: Circumstantial Judgement: Partial Orientation: Person, Place, Time, Situation Obsessive Compulsive Thoughts/Behaviors: None  Cognitive Functioning Concentration: Poor Memory: Recent Intact Is patient IDD: No Is patient DD?: No Insight: Poor Impulse Control: Poor Appetite: Fair Sleep: No Change Total Hours of Sleep: 8 Vegetative Symptoms: None  ADLScreening [facility] Assessment  Services) Patient's cognitive ability adequate to safely complete daily activities?: Yes Patient able to express need for assistance with ADLs?: Yes Independently performs ADLs?: Yes (appropriate for developmental age)  Prior Inpatient Therapy Prior Inpatient Therapy: Yes(Per chart. ) Prior Therapy Dates: Per chart, January 2019; 10/2017; 08/2017. Prior Therapy Facilty/Provider(s): Per chart, [facility]. Reason for Treatment: Per chart, hallucinations.  Prior Outpatient Therapy Prior Outpatient Therapy: No Prior Therapy Dates: Per chart, 2018. Prior Therapy Facilty/Provider(s): Per chart, [facility].  Reason for Treatment: Medication management.  Does patient have an ACCT team?: No Does patient have Intensive In-House Services?  : No Does patient have [facility] services? : No Does patient have P4CC services?: No  ADL Screening (condition at time of admission) Patient's cognitive ability adequate to safely complete daily activities?: Yes Is the patient deaf or have difficulty hearing?: No Does the patient have difficulty seeing, even when wearing glasses/contacts?: No Does the patient have difficulty concentrating, remembering, or making decisions?: No Patient able to express need for assistance with ADLs?: Yes Does the patient have difficulty dressing or bathing?: No Independently performs ADLs?: Yes (appropriate for developmental age) Does the patient have difficulty walking or climbing stairs?: No Weakness of Legs: None Weakness of Arms/Hands: None  Home Assistive Devices/Equipment Home Assistive Devices/Equipment: None    Abuse/Neglect Assessment (Assessment to be complete while patient is alone) Abuse/Neglect Assessment Can Be Completed: Yes Physical Abuse: Denies(Pt denies. ) Verbal Abuse: Denies(Pt denies. ) Sexual Abuse: Denies(Pt denies. ) Exploitation of patient/patient's resources: Denies(Pt denies. ) Self-Neglect: Denies(Pt denies. )     Advance Directives (For  Healthcare) Does Patient Have a Medical Advance Directive?: No    Additional Information 1:1 In Past 12 Months?: No CIRT Risk: No Elopement Risk: No Does patient have medical clearance?: Yes     Disposition: Lisa, PA recommends pt to be discharged in the morning. Disposition discussed with Rashell, RN.   Disposition Initial Assessment Completed for this Encounter: Yes Disposition of Patient: Discharge(pt to be discharged in the morning.)  On Site Evaluation by:  Redmond Pulling, MS, LPC, CRC Reviewed with Physician:  Misty Stanley, [place] and Donell Sievert, PA.  Redmond Pulling 04/11/2018 4:02 AM   Redmond Pulling, MS, [facility], CRC Triage Specialist [phone]

## 2018-04-11 NOTE — ED Provider Notes (Signed)
[facility]-EMERGENCY DEPT Provider Note   CSN: [id number] Arrival date & time: 04/11/18  0124     History   Chief Complaint Chief Complaint  Patient presents with  . hallucinating    HPI Cole Barrett is a 32 y.o. male.  The history is provided by the patient and medical records.     32 year old male with history of homelessness, hyperlipidemia, schizophrenia, presenting to the ED for hallucinations.  Patient reports he is hearing voices and seeing "things" but will not give further details.  This has been ongoing for about a year.  States he has not been on any medications.  He denies any suicidal or homicidal ideation.  Denies any drug or alcohol abuse.  Patient has been seen here multiple times for same.  Past Medical History:  Diagnosis Date  . Homelessness   . Hypercholesterolemia   . Mental disorder   . Paranoid behavior (HCC)   . PE (pulmonary embolism)   . Schizophrenia [facility])     Patient Active Problem List   Diagnosis Date Noted  . Schizophrenia (HCC) 12/24/2017  . 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 Schizophrenia, paranoid (HCC)   . 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 MDD (major depressive disorder) 09/22/2017  . Alcohol abuse   . Auditory hallucinations   . Alcohol abuse with alcohol-induced mood disorder (HCC) 09/08/2016  . Adjustment disorder with depressed mood 09/07/2016  . Homelessness 09/03/2016  . Person feigning illness 09/03/2016  . 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 Cannabis use disorder, mild, abuse 08/29/2016  . Pulmonary emboli (HCC) 07/13/2016  . 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 Acute left flank pain 01/02/2014  . Pulmonary embolism, bilateral (HCC) 01/02/2014    History reviewed. No pertinent surgical history.      Home Medications    Prior to Admission medications   Medication  Sig Start Date End Date Taking? Authorizing Provider  paliperidone (INVEGA) 6 MG 24 hr tablet Take 1 tablet (6 mg total) by mouth daily. 03/06/18  Yes Laveda Abbe, NP    Family History Family History  Problem Relation Age of Onset  . Hypertension Mother     Social History Social History   Tobacco Use  . Smoking status: Current Every Day Smoker    Packs/day: 0.50    Types: Cigarettes  . Smokeless tobacco: Never Used  Substance Use Topics  . Alcohol use: Yes    Comment: Last drink: yesterday   . Drug use: Yes    Types: Marijuana    Comment: Last used: 2 weeks ago     Allergies   Haldol [haloperidol]   Review of Systems Review of Systems  Psychiatric/Behavioral: Positive for hallucinations.  All other systems reviewed and are negative.    Physical Exam Updated Vital Signs BP 125/76 (BP Location: Left Arm)   Pulse 90   Temp (!) 97.5 F (36.4 C)   Resp 18   SpO2 96%   Physical Exam  Constitutional: He is oriented to person, place, and time. He appears well-developed and well-nourished.  HENT:  Head: Normocephalic and atraumatic.  Mouth/Throat: Oropharynx is clear and moist.  Eyes: Pupils are equal, round, and reactive to light. Conjunctivae and EOM are normal.  Neck: Normal range of motion.  Cardiovascular: Normal rate, regular rhythm and normal heart sounds.  Pulmonary/Chest: Effort normal and breath sounds normal. No stridor. No respiratory distress.  Abdominal: Soft. Bowel sounds  are normal. There is no tenderness. There is no rebound.  Musculoskeletal: Normal range of motion.  Neurological: He is alert and oriented to person, place, and time.  Skin: Skin is warm and dry.  Psychiatric: He has a normal mood and affect.  Reports hallucinations Denies SI/HI  Nursing note and vitals reviewed.    ED Treatments / Results  Labs (all labs ordered are listed, but only abnormal results are displayed) Labs Reviewed  ETHANOL - Abnormal; Notable for the  following components:      Result Value   Alcohol, Ethyl (B) 111 (*)    All other components within normal limits  ACETAMINOPHEN LEVEL - Abnormal; Notable for the following components:   Acetaminophen (Tylenol), Serum <10 (*)    All other components within normal limits  COMPREHENSIVE METABOLIC PANEL  SALICYLATE LEVEL  CBC  RAPID URINE DRUG SCREEN, HOSP PERFORMED    EKG None  Radiology No results found.  Procedures Procedures (including critical care time)  Medications Ordered in ED Medications - No data to display   Initial Impression / Assessment and Plan / ED Course  I have reviewed the triage vital signs and the nursing notes.  Pertinent labs & imaging results that were available during my care of the patient were reviewed by me and considered in my medical decision making (see chart for details).  32 y.o. M here with AVH.  Has been ongoing for 1 year.  Rather vague in his complaints, limited details.  Not currently on medications.  Denies SI/HI.  Labs reassuring, ethanol 111.  Medically clear.  TTS has evaluated-- does not meet criteria for IP at this time, has been seen here numerous times for same.  I will observe overnight, discharge in the AM with outpatient resources.  Patient has been observed overnight, no acute events/changes.  Have given outpatient resources, encouraged follow-up.  Discussed plan with patient, he acknowledged understanding and agreed with plan of care.  Return precautions given for new or worsening symptoms.  Final Clinical Impressions(s) / ED Diagnoses   Final diagnoses:  Hallucinations    ED Discharge Orders    None       Garlon Hatchet, PA-C 04/11/18 0535    Geoffery Lyons, MD 04/11/18 [id number]

## 2018-04-11 NOTE — ED Notes (Signed)
AVS and bus pass provided. AVS reviewed and understanding verbalized. Denied SI/HI/AVH. Denied pain. Pt offered no questions or concerns. Escorted off the unit, belongings returned and directed to the exit 

## 2018-04-11 NOTE — ED Notes (Signed)
Patient denies SI/HI/VH. Patient reports AH. Snack and soda given. Plan of care discussed. Encouragement and support provided and safety maintain. Q 15 min safety checks remain place and video monitoring.

## 2018-04-28 ENCOUNTER — Encounter (HOSPITAL_COMMUNITY): Payer: Self-pay | Admitting: Emergency Medicine

## 2018-04-28 ENCOUNTER — Emergency Department (HOSPITAL_COMMUNITY)
Admission: EM | Admit: 2018-04-28 | Discharge: 2018-04-29 | Disposition: A | Discharge status: Home / Self Care | Payer: Self-pay | Attending: Emergency Medicine | Admitting: Emergency Medicine

## 2018-04-28 ENCOUNTER — Other Ambulatory Visit: Payer: Self-pay

## 2018-04-28 DIAGNOSIS — Z590 Homelessness: Secondary | ICD-10-CM | POA: Insufficient documentation

## 2018-04-28 DIAGNOSIS — F17210 Nicotine dependence, cigarettes, uncomplicated: Secondary | ICD-10-CM | POA: Insufficient documentation

## 2018-04-28 DIAGNOSIS — F329 Major depressive disorder, single episode, unspecified: Secondary | ICD-10-CM | POA: Insufficient documentation

## 2018-04-28 DIAGNOSIS — F4321 Adjustment disorder with depressed mood: Secondary | ICD-10-CM | POA: Diagnosis present

## 2018-04-28 DIAGNOSIS — R440 Auditory hallucinations: Secondary | ICD-10-CM | POA: Insufficient documentation

## 2018-04-28 LAB — CBC
HEMATOCRIT: 37.4 % — AB (ref 39.0–52.0)
Hemoglobin: 12.4 g/dL — ABNORMAL LOW (ref 13.0–17.0)
MCH: 27.9 pg (ref 26.0–34.0)
MCHC: 33.2 g/dL (ref 30.0–36.0)
MCV: 84.2 fL (ref 78.0–100.0)
PLATELETS: 291 10*3/uL (ref 150–400)
RBC: 4.44 MIL/uL (ref 4.22–5.81)
RDW: 13.3 % (ref 11.5–15.5)
WBC: 4.5 10*3/uL (ref 4.0–10.5)

## 2018-04-28 LAB — COMPREHENSIVE METABOLIC PANEL
ALT: 30 U/L (ref 17–63)
ANION GAP: 10 (ref 5–15)
AST: 25 U/L (ref 15–41)
Albumin: 4 g/dL (ref 3.5–5.0)
Alkaline Phosphatase: 43 U/L (ref 38–126)
BUN: 11 mg/dL (ref 6–20)
CALCIUM: 9 mg/dL (ref 8.9–10.3)
CO2: 24 mmol/L (ref 22–32)
CREATININE: 1.05 mg/dL (ref 0.61–1.24)
Chloride: 105 mmol/L (ref 101–111)
GFR calc non Af Amer: >60 mL/min (ref >60)
GLUCOSE: 137 mg/dL — AB (ref 65–99)
Potassium: 3.5 mmol/L (ref 3.5–5.1)
SODIUM: 139 mmol/L (ref 135–145)
TOTAL PROTEIN: 7.2 g/dL (ref 6.5–8.1)
Total Bilirubin: 0.2 mg/dL — ABNORMAL LOW (ref 0.3–1.2)

## 2018-04-28 LAB — RAPID URINE DRUG SCREEN, HOSP PERFORMED
Amphetamines: NOT DETECTED
BARBITURATES: NOT DETECTED
Benzodiazepines: NOT DETECTED
Cocaine: NOT DETECTED
Opiates: NOT DETECTED
Tetrahydrocannabinol: POSITIVE — AB

## 2018-04-28 LAB — SALICYLATE LEVEL: Salicylate Lvl: <7 mg/dL (ref 2.8–30.0)

## 2018-04-28 LAB — ACETAMINOPHEN LEVEL: Acetaminophen (Tylenol), Serum: <10 ug/mL — ABNORMAL LOW (ref 10–30)

## 2018-04-28 LAB — ETHANOL

## 2018-04-28 NOTE — ED Triage Notes (Signed)
Patient states that he is hearing voices that is saying they are going to kill him. Patient states he does not want to hurt himself.

## 2018-04-28 NOTE — ED Notes (Signed)
Bed: [facility] Expected date:  Expected time:  Means of arrival:  Comments: 

## 2018-04-29 ENCOUNTER — Encounter (HOSPITAL_COMMUNITY): Payer: Self-pay

## 2018-04-29 MED ORDER — PALIPERIDONE ER 6 MG PO TB24: 6.0000 mg | ORAL_TABLET | Freq: Every day | ORAL | 0 refills | Status: DC

## 2018-04-29 MED ORDER — PALIPERIDONE ER 6 MG PO TB24
6.0000 mg | ORAL_TABLET | Freq: Every day | ORAL | Status: DC
  Administered 2018-04-29: 6 mg via ORAL
  Filled 2018-04-29: qty 1

## 2018-04-29 NOTE — Progress Notes (Signed)
Pt is in room 42.  Pt is frequent patient.  Pt happy and smiling and asking for snacks and drink. Pt denies pain or discomfort.  Pt denies SI and HI.  Pt confirms that he hears voices.  Typically the complaint of the patient in the past.  Pt verbally contracts for safety. Pt in bed, eating snack. Pt remains safe on unit

## 2018-04-29 NOTE — BH Assessment (Addendum)
Assessment Note  Cole Barrett is an 32 y.o. male who presents to the ED voluntarily. Pt reports he has been experiencing AH that tell him they are going to kill him. Pt has 23 ED visits in the past 6 months c/o similar concerns. Pt was recently assessed by TTS on 04/11/18 due to similar concerns. Pt states he was "doing pretty good" for a while but states the AH returned today and "got really bad." Pt denies any other stressors. Pt reports he is no longer homeless and states he lives in a house by himself. Pt reports he works at [name] and when asked he states he has been working there for many years.   Pt is falling asleep with his eyes closed throughout the assessment. Pt denies any changes in appetite or sleeping habits. When asked how many hours of sleep the pt gets each night he states "it's straight."   Pt denies any other stressors at present. Pt states he does not have a current provider and states he is not allowed to return to [facility] but does not disclose the reason. Pt states he "just wants to go to a hospital so a doctor can watch over me."   TTS consulted with Donell Sievert, PA who recommends continued observation for safety and stabilization and to be reassessed in the AM by psych. EDP Dr. Bebe Shaggy, MD and pt's nurse Jonny Ruiz, RN have been notified of the disposition.  Diagnosis: Schizophrenia   Past Medical History:  Past Medical History:  Diagnosis Date  . Homelessness   . Hypercholesterolemia   . Mental disorder   . Paranoid behavior (HCC)   . PE (pulmonary embolism)   . Schizophrenia (HCC)     History reviewed. No pertinent surgical history.  Family History:  Family History  Problem Relation Age of Onset  . Hypertension Mother     Social History:  reports that he has been smoking cigarettes.  He has been smoking about 0.50 packs per day. He has never used smokeless tobacco. He reports that he drinks alcohol. He reports that he has current or past drug  history. Drug: Marijuana.  Additional Social History:  Alcohol / Drug Use Pain Medications: See MAR Prescriptions: See MAR Over the Counter: See MAR History of alcohol / drug use?: Yes Substance #1 Name of Substance 1: Cannabis 1 - Age of First Use: teens 1 - Amount (size/oz): varies 1 - Frequency: occasional 1 - Duration: ongoing 1 - Last Use / Amount: 04/28/18  CIWA: CIWA-Ar BP: (!) 147/87 Pulse Rate: 98 COWS:    Allergies:  Allergies  Allergen Reactions  . Haldol [Haloperidol] Shortness Of Breath    Home Medications:  (Not in a hospital admission)  OB/GYN Status:  No LMP for male patient.  General Assessment Data [facility] TTS Assessment: In system Is this a Tele or Face-to-Face Assessment?: Face-to-Face Is this an Initial Assessment or a Re-assessment for this encounter?: Initial Assessment Marital status: Single Is patient pregnant?: No Pregnancy Status: No Living Arrangements: Alone Can pt return to current living arrangement?: Yes Admission Status: Voluntary Is patient capable of signing voluntary admission?: Yes Referral Source: Self/Family/Friend Insurance type: none     Crisis Care Plan Living Arrangements: Alone Name of Psychiatrist: NA Name of Therapist: NA  Education Status Is patient currently in school?: No Is the patient employed, unemployed or receiving disability?: Employed(pt reports he works for [employer] )  Risk to self with the past 6 months Suicidal  Ideation: No Has patient been a risk to self within the past 6 months prior to admission? : No Suicidal Intent: No Has patient had any suicidal intent within the past 6 months prior to admission? : No Is patient at risk for suicide?: No Suicidal Plan?: No Has patient had any suicidal plan within the past 6 months prior to admission? : No Access to Means: No What has been your use of drugs/alcohol within the last 12 months?: admits to occasional cannabis use   Previous Attempts/Gestures: No Triggers for Past Attempts: None known Intentional Self Injurious Behavior: None Family Suicide History: No Recent stressful life event(s): Other (Comment)(worsening AH) Persecutory voices/beliefs?: Yes Depression: No Substance abuse history and/or treatment for substance abuse?: No Suicide prevention information given to non-admitted patients: Not applicable  Risk to Others within the past 6 months Homicidal Ideation: No Does patient have any lifetime risk of violence toward others beyond the six months prior to admission? : No Thoughts of Harm to Others: No Current Homicidal Intent: No Current Homicidal Plan: No Access to Homicidal Means: No History of harm to others?: No Assessment of Violence: None Noted Does patient have access to weapons?: No Criminal Charges Pending?: No Does patient have a court date: No Is patient on probation?: No  Psychosis Hallucinations: Auditory, With command Delusions: None noted  Mental Status Report Appearance/Hygiene: In scrubs, Unremarkable Eye Contact: Poor Motor Activity: Freedom of movement Speech: Slow Level of Consciousness: Drowsy Mood: Helpless Affect: Flat Anxiety Level: None Thought Processes: Relevant, Coherent Judgement: Partial Orientation: Person, Place, Time, Situation, Appropriate for developmental age Obsessive Compulsive Thoughts/Behaviors: None  Cognitive Functioning Concentration: Fair Memory: Remote Intact, Recent Intact Is patient IDD: No Is patient DD?: No Insight: Fair Impulse Control: Fair Appetite: Good Have you had any weight changes? : No Change Sleep: No Change Total Hours of Sleep: 8 Vegetative Symptoms: None  ADLScreening [facility] Assessment Services) Patient's cognitive ability adequate to safely complete daily activities?: Yes Patient able to express need for assistance with ADLs?: Yes Independently performs ADLs?: Yes (appropriate for developmental age)  Prior  Inpatient Therapy Prior Inpatient Therapy: Yes Prior Therapy Dates: Per chart, January 2019; 10/2017; 08/2017. Prior Therapy Facilty/Provider(s): Per chart, [facility]. Reason for Treatment: Per chart, hallucinations.  Prior Outpatient Therapy Prior Outpatient Therapy: Yes Prior Therapy Dates: 2018 Prior Therapy Facilty/Provider(s): Per chart, [facility].  Reason for Treatment: Medication management.  Does patient have an ACCT team?: No Does patient have Intensive In-House Services?  : No Does patient have [facility] services? : No Does patient have P4CC services?: No  ADL Screening (condition at time of admission) Patient's cognitive ability adequate to safely complete daily activities?: Yes Is the patient deaf or have difficulty hearing?: No Does the patient have difficulty seeing, even when wearing glasses/contacts?: No Does the patient have difficulty concentrating, remembering, or making decisions?: No Patient able to express need for assistance with ADLs?: Yes Does the patient have difficulty dressing or bathing?: No Independently performs ADLs?: Yes (appropriate for developmental age) Does the patient have difficulty walking or climbing stairs?: No Weakness of Legs: None Weakness of Arms/Hands: None  Home Assistive Devices/Equipment Home Assistive Devices/Equipment: None    Abuse/Neglect Assessment (Assessment to be complete while patient is alone) Abuse/Neglect Assessment Can Be Completed: Yes Physical Abuse: Denies Verbal Abuse: Denies Sexual Abuse: Denies Exploitation of patient/patient's resources: Denies Self-Neglect: Denies     Merchant navy officer (For Healthcare) Does Patient Have a Medical Advance Directive?: No Would patient like information on creating a medical  advance directive?: No - Patient declined    Additional Information 1:1 In Past 12 Months?: No CIRT Risk: No Elopement Risk: No Does patient have medical clearance?: Yes     Disposition: TTS  consulted with Donell SievertSpencer Simon, PA who recommends continued observation for safety and stabilization and to be reassessed in the AM by psych. EDP Dr. Bebe ShaggyWickline, MD and pt's nurse Jonny RuizJohn, RN have been notified of the disposition.  Disposition Initial Assessment Completed for this Encounter: Yes Disposition of Patient: (overnight OBS pending AM psych reassessment ) Patient refused recommended treatment: No  On Site Evaluation by:   Reviewed with Physician:    Karolee OhsAquicha R Tajia Szeliga 04/29/2018 1:48 AM

## 2018-04-29 NOTE — [facility] Suicide Risk Assessment (Signed)
Suicide Risk Assessment  Discharge Assessment   [facility]   Principal Problem: Auditory hallucinations Discharge Diagnoses:  Patient Active Problem List   Diagnosis Date Noted  . Auditory hallucinations [R44.0]     Priority: High  . Homelessness [Z59.0] 09/03/2016    Priority: High  . Person feigning illness [Z76.5] 09/03/2016    Priority: High  . Alcohol abuse with alcohol-induced mood disorder (HCC) [F10.14] 09/08/2016    Priority: Low  . MDD (major depressive disorder), recurrent, severe, with psychosis (HCC) [F33.3] 11/21/2017  . Alcohol abuse [F10.10]   . 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 Cannabis use disorder, mild, abuse [F12.10] 08/29/2016  . Pulmonary emboli (HCC) [I26.99] 07/13/2016  . Pulmonary embolism and infarction (HCC) [I26.99] 01/02/2014  . Acute left flank pain [R10.9] 01/02/2014  . Pulmonary embolism, bilateral (HCC) [I26.99] 01/02/2014    Total Time spent with patient: 45 minutes  Musculoskeletal: Strength & Muscle Tone: within normal limits Gait & Station: normal Patient leans: N/A  Psychiatric Specialty Exam:   Blood pressure 122/74, pulse 63, temperature 98.6 F (37 C), temperature source Oral, resp. rate 18, height 5\' 10"  (1.778 m), weight 99.8 kg (220 lb), SpO2 100 %.Body mass index is 31.57 kg/m.  General Appearance: Casual  Eye Contact::  Good  Speech:  Normal Rate409  Volume:  Normal  Mood:  Euthymic  Affect:  Congruent  Thought Process:  Coherent and Descriptions of Associations: Intact  Orientation:  Full (Time, Place, and Person)  Thought Content:  WDL and Logical  Suicidal Thoughts:  No  Homicidal Thoughts:  No  Memory:  Immediate;   Good Recent;   Good Remote;   Good  Judgement:  Fair  Insight:  Fair  Psychomotor Activity:  Normal  Concentration:  Good  Recall:  Good  Fund of Knowledge:Fair  Language: Good  Akathisia:  No  Handed:  Right  AIMS (if indicated):     Assets:   Leisure Time Physical Health Resilience Social Support  Sleep:     Cognition: WNL  ADL's:  Intact   Mental Status Per Nursing Assessment::   On Admission:   32 yo male who presented to the ED with auditory hallucinations.  Well known to this ED and providers for multiple, similar presentations.  He is not responding to internal stimuli but often feigns symptoms as he is homeless.  Typically he does not follow-up with his outpatient providers nor does he take his medication.  Unfortunately he usually drinks or uses cocaine, none this time.  No suicidal/homicidal ideations, hallucinations, or withdrawal symptoms.  Stable for discharge.  Demographic Factors:  Living alone  Loss Factors: NA  Historical Factors: NA  Risk Reduction Factors:   Sense of responsibility to family and Employed  Continued Clinical Symptoms:  Mild auditory hallucinations  Cognitive Features That Contribute To Risk:  None    Suicide Risk:  Minimal: No identifiable suicidal ideation.  Patients presenting with no risk factors but with morbid ruminations; may be classified as minimal risk based on the severity of the depressive symptoms    Plan Of Care/Follow-up recommendations:  Activity:  as tolerated Diet:  heart healthy diet  LORD, JAMISON, NP 04/29/2018, 7:46 AM

## 2018-04-29 NOTE — ED Notes (Signed)
Upon arrival to unit patient was given a sandwich, crackers, cheese stick and sprite

## 2018-04-29 NOTE — Progress Notes (Signed)
TTS consulted with Cole SievertSpencer Simon, PA who recommends continued observation for safety and stabilization and to be reassessed in the AM by psych. EDP Dr. Bebe ShaggyWickline, MD and pt's nurse Cole RuizJohn, RN have been notified of the disposition.  Cole Barrett, MSW, LCSW Therapeutic Triage Specialist  [phone]

## 2018-04-29 NOTE — ED Notes (Signed)
Pt discharged safely with resources.  Discharge instructions and RX were reviewed.  All belongings were returned to patient.

## 2018-04-29 NOTE — ED Notes (Signed)
Patient belongings are in the dayroom on acute  unit

## 2018-04-29 NOTE — ED Provider Notes (Signed)
[facility]-EMERGENCY DEPT Provider Note   CSN: [id number] Arrival date & time: 04/28/18  2232     History   Chief Complaint Chief Complaint  Patient presents with  . Medical Clearance    HPI Cole Barrett is a 32 y.o. male.  Patient with a history of schizophrenia returns to ED with concern for hearing voices that are telling him they are going to hurt him. He states this is similar to previous episodes but "they got bad today". He is not taking any medications currently because "I've been doing good". No SI/HI. No self injury prior to arrival.   The history is provided by the patient. No language interpreter was used.    Past Medical History:  Diagnosis Date  . Homelessness   . Hypercholesterolemia   . Mental disorder   . Paranoid behavior (HCC)   . PE (pulmonary embolism)   . Schizophrenia [facility])     Patient Active Problem List   Diagnosis Date Noted  . Schizophrenia (HCC) 12/24/2017  . 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 Schizophrenia, paranoid (HCC)   . 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 MDD (major depressive disorder) 09/22/2017  . Alcohol abuse   . Auditory hallucinations   . Alcohol abuse with alcohol-induced mood disorder (HCC) 09/08/2016  . Adjustment disorder with depressed mood 09/07/2016  . Homelessness 09/03/2016  . Person feigning illness 09/03/2016  . 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 Cannabis use disorder, mild, abuse 08/29/2016  . Pulmonary emboli (HCC) 07/13/2016  . 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 Acute left flank pain 01/02/2014  . Pulmonary embolism, bilateral (HCC) 01/02/2014    History reviewed. No pertinent surgical history.      Home Medications    Prior to Admission medications   Medication Sig Start Date End Date Taking? Authorizing Provider  paliperidone (INVEGA) 6 MG 24 hr  tablet Take 1 tablet (6 mg total) by mouth daily. 03/06/18   Laveda Abbe, NP    Family History Family History  Problem Relation Age of Onset  . Hypertension Mother     Social History Social History   Tobacco Use  . Smoking status: Current Every Day Smoker    Packs/day: 0.50    Types: Cigarettes  . Smokeless tobacco: Never Used  Substance Use Topics  . Alcohol use: Yes    Comment: Last drink: yesterday   . Drug use: Yes    Types: Marijuana    Comment: Last used: 2 weeks ago     Allergies   Haldol [haloperidol]   Review of Systems Review of Systems  Constitutional: Negative for chills and fever.  HENT: Negative.   Respiratory: Negative.   Cardiovascular: Negative.   Gastrointestinal: Negative.   Musculoskeletal: Negative.   Skin: Negative.   Neurological: Negative.   Psychiatric/Behavioral: Positive for hallucinations. Negative for suicidal ideas.     Physical Exam Updated Barrett Signs BP (!) 147/87 (BP Location: Left Arm)   Pulse 98   Temp 98.4 F (36.9 C) (Oral)   Resp 18   Ht 5\' 10"  (1.778 m)   Wt 99.8 kg (220 lb)   SpO2 97%   BMI 31.57 kg/m   Physical Exam  Constitutional: He is oriented to person, place, and time. He appears well-developed and well-nourished.  HENT:  Head: Normocephalic.  Neck: Normal range of motion. Neck supple.  Cardiovascular: Normal rate and regular rhythm.  No murmur heard.  Pulmonary/Chest: Effort normal and breath sounds normal. He has no wheezes. He has no rales.  Abdominal: Soft. Bowel sounds are normal. There is no tenderness. There is no rebound and no guarding.  Musculoskeletal: Normal range of motion.  Neurological: He is alert and oriented to person, place, and time.  Skin: Skin is warm and dry. No rash noted.  Psychiatric: He has a normal mood and affect.  Calm, cooperative, paranoid.     ED Treatments / Results  Labs (all labs ordered are listed, but only abnormal results are displayed) Labs  Reviewed  COMPREHENSIVE METABOLIC PANEL - Abnormal; Notable for the following components:      Result Value   Glucose, Bld 137 (*)    Total Bilirubin 0.2 (*)    All other components within normal limits  ACETAMINOPHEN LEVEL - Abnormal; Notable for the following components:   Acetaminophen (Tylenol), Serum <10 (*)    All other components within normal limits  CBC - Abnormal; Notable for the following components:   Hemoglobin 12.4 (*)    HCT 37.4 (*)    All other components within normal limits  RAPID URINE DRUG SCREEN, HOSP PERFORMED - Abnormal; Notable for the following components:   Tetrahydrocannabinol POSITIVE (*)    All other components within normal limits  ETHANOL  SALICYLATE LEVEL    EKG None  Radiology No results found.  Procedures Procedures (including critical care time)  Medications Ordered in ED Medications - No data to display   Initial Impression / Assessment and Plan / ED Course  I have reviewed the triage Barrett signs and the nursing notes.  Pertinent labs & imaging results that were available during my care of the patient were reviewed by me and considered in my medical decision making (see chart for details).     Patient with a history of schizophrenia here with auditory hallucinations described as voices that are telling him they want to hurt him. No SI/HI.   TTS consultation requested to determine disposition.  Final Clinical Impressions(s) / ED Diagnoses   Final diagnoses:  None   1. Auditory hallucinations  ED Discharge Orders    None       Elpidio AnisUpstill, Juandiego Kolenovic, PA-C 04/29/18 0053    Zadie RhineWickline, Donald, MD 04/30/18 [phone]

## 2018-05-02 ENCOUNTER — Emergency Department (HOSPITAL_COMMUNITY)
Admission: EM | Admit: 2018-05-02 | Discharge: 2018-05-02 | Disposition: A | Discharge status: Home / Self Care | Payer: Self-pay | Attending: Emergency Medicine | Admitting: Emergency Medicine

## 2018-05-02 ENCOUNTER — Encounter (HOSPITAL_COMMUNITY): Payer: Self-pay | Admitting: *Deleted

## 2018-05-02 ENCOUNTER — Other Ambulatory Visit: Payer: Self-pay

## 2018-05-02 DIAGNOSIS — R440 Auditory hallucinations: Secondary | ICD-10-CM | POA: Insufficient documentation

## 2018-05-02 DIAGNOSIS — Z590 Homelessness: Secondary | ICD-10-CM | POA: Insufficient documentation

## 2018-05-02 DIAGNOSIS — F17210 Nicotine dependence, cigarettes, uncomplicated: Secondary | ICD-10-CM | POA: Insufficient documentation

## 2018-05-02 DIAGNOSIS — Z79899 Other long term (current) drug therapy: Secondary | ICD-10-CM | POA: Insufficient documentation

## 2018-05-02 LAB — COMPREHENSIVE METABOLIC PANEL
ALT: 32 U/L (ref 17–63)
AST: 29 U/L (ref 15–41)
Albumin: 3.8 g/dL (ref 3.5–5.0)
Alkaline Phosphatase: 43 U/L (ref 38–126)
Anion gap: 9 (ref 5–15)
BILIRUBIN TOTAL: 0.7 mg/dL (ref 0.3–1.2)
BUN: 8 mg/dL (ref 6–20)
CHLORIDE: 105 mmol/L (ref 101–111)
CO2: 24 mmol/L (ref 22–32)
CREATININE: 1.18 mg/dL (ref 0.61–1.24)
Calcium: 8.5 mg/dL — ABNORMAL LOW (ref 8.9–10.3)
Glucose, Bld: 93 mg/dL (ref 65–99)
POTASSIUM: 4 mmol/L (ref 3.5–5.1)
Sodium: 138 mmol/L (ref 135–145)
TOTAL PROTEIN: 7 g/dL (ref 6.5–8.1)

## 2018-05-02 LAB — CBC
HCT: 39.3 % (ref 39.0–52.0)
Hemoglobin: 12.7 g/dL — ABNORMAL LOW (ref 13.0–17.0)
MCH: 27.2 pg (ref 26.0–34.0)
MCHC: 32.3 g/dL (ref 30.0–36.0)
MCV: 84.2 fL (ref 78.0–100.0)
PLATELETS: 305 10*3/uL (ref 150–400)
RBC: 4.67 MIL/uL (ref 4.22–5.81)
RDW: 12.8 % (ref 11.5–15.5)
WBC: 3.7 10*3/uL — ABNORMAL LOW (ref 4.0–10.5)

## 2018-05-02 LAB — ACETAMINOPHEN LEVEL: Acetaminophen (Tylenol), Serum: <10 ug/mL — ABNORMAL LOW (ref 10–30)

## 2018-05-02 LAB — SALICYLATE LEVEL: Salicylate Lvl: <7 mg/dL (ref 2.8–30.0)

## 2018-05-02 LAB — ETHANOL

## 2018-05-02 MED ORDER — ALUM & MAG HYDROXIDE-SIMETH 200-200-20 MG/5ML PO SUSP: 30.0000 mL | Freq: Four times a day (QID) | ORAL | Status: DC | PRN

## 2018-05-02 MED ORDER — ZOLPIDEM TARTRATE 5 MG PO TABS: 5.0000 mg | ORAL_TABLET | Freq: Every evening | ORAL | Status: DC | PRN

## 2018-05-02 MED ORDER — ACETAMINOPHEN 325 MG PO TABS: 650.0000 mg | ORAL_TABLET | ORAL | Status: DC | PRN

## 2018-05-02 MED ORDER — ONDANSETRON HCL 4 MG PO TABS: 4.0000 mg | ORAL_TABLET | Freq: Three times a day (TID) | ORAL | Status: DC | PRN

## 2018-05-02 MED ORDER — NICOTINE 21 MG/24HR TD PT24: 21.0000 mg | MEDICATED_PATCH | Freq: Every day | TRANSDERMAL | Status: DC

## 2018-05-02 NOTE — Progress Notes (Signed)
Disposition CSW called and spoke to [facility] Provider, [place]bigail, [place]PA.  CSW explained that resources given [facility] were sufficient if pt only needed referrals.  [facility] [place] [facility].  Requested that TTS consult request be withdrawn.  PA expressed understanding and agreed to withdraw consult shortly.  Timmothy EulerJean T. Kaylyn LimSutter, MSW, LCSWA Disposition Clinical Social Work [phone] (cell) [phone] (office)

## 2018-05-02 NOTE — ED Provider Notes (Signed)
[name] EMERGENCY DEPARTMENT Provider Note   CSN: [id number] Arrival date & time: 05/02/18  0303     History   Chief Complaint Chief Complaint  Patient presents with  . Medical Clearance    HPI Cole Barrett is a 32 y.o. male with a past medical history of Homelessness and schizophrenia who presents the emergency department for hallucinations.  Patient states that he is not taking any of his medications.  He states that he was felt like he was doing well however over the past week he has had worsening voices that he states "are very loud."  They tell him mean things like he should kill himself.  He does not feel suicidal or homicidal.  Denies alcohol or drug abuse.  He does not follow-up outpatient.  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  The patient was seen for the same complaints 4 days ago, evaluated by psych and discharged to outpatient follow-up.   HPI  Past Medical History:  Diagnosis Date  . Homelessness   . Hypercholesterolemia   . Mental disorder   . Paranoid behavior (HCC)   . PE (pulmonary embolism)   . Schizophrenia [facility])     Patient Active Problem List   Diagnosis Date Noted  . 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 Alcohol abuse   . Auditory hallucinations   . Alcohol abuse with alcohol-induced mood disorder (HCC) 09/08/2016  . Homelessness 09/03/2016  . Person feigning illness 09/03/2016  . 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 Cannabis use disorder, mild, abuse 08/29/2016  . Pulmonary emboli (HCC) 07/13/2016  . 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 Acute left flank pain 01/02/2014  . Pulmonary embolism, bilateral (HCC) 01/02/2014    History reviewed. No pertinent surgical history.      Home Medications    Prior to Admission medications    Medication Sig Start Date End Date Taking? Authorizing Provider  paliperidone (INVEGA) 6 MG 24 hr tablet Take 1 tablet (6 mg total) by mouth daily. 04/29/18   Charm Rings, NP    Family History Family History  Problem Relation Age of Onset  . Hypertension Mother     Social History Social History   Tobacco Use  . Smoking status: Current Every Day Smoker    Packs/day: 0.50    Types: Cigarettes  . Smokeless tobacco: Never Used  Substance Use Topics  . Alcohol use: Yes    Comment: Last drink: yesterday   . Drug use: Yes    Types: Marijuana    Comment: Last used: 2 weeks ago     Allergies   Haldol [haloperidol]   Review of Systems Review of Systems Ten systems reviewed and are negative for acute change, except as noted in the HPI.    Physical Exam Updated Vital Signs BP 137/79 (BP Location: Right Arm)   Pulse 88   Resp 16   Ht 5\' 10"  (1.778 m)   Wt 99.8 kg (220 lb)   SpO2 98%   BMI 31.57 kg/m   Physical Exam  Constitutional: He appears well-developed and well-nourished. No distress.  HENT:  Head: Normocephalic and atraumatic.  Eyes: Conjunctivae are normal. No scleral icterus.  Neck: Normal range of motion. Neck supple.  Cardiovascular: Normal rate, regular rhythm and normal heart sounds.  Pulmonary/Chest: Effort normal and breath  sounds normal. No respiratory distress.  Abdominal: Soft. There is no tenderness.  Musculoskeletal: He exhibits no edema.  Neurological: He is alert.  Skin: Skin is warm and dry. He is not diaphoretic.  Psychiatric: His behavior is normal.  Patient responding appropriately.  He does not appear to be responding to internal stimuli.  Conversations are lucid.  Nursing note and vitals reviewed.    ED Treatments / Results  Labs (all labs ordered are listed, but only abnormal results are displayed) Labs Reviewed  COMPREHENSIVE METABOLIC PANEL - Abnormal; Notable for the following components:      Result Value   Calcium 8.5 (*)     All other components within normal limits  ACETAMINOPHEN LEVEL - Abnormal; Notable for the following components:   Acetaminophen (Tylenol), Serum <10 (*)    All other components within normal limits  CBC - Abnormal; Notable for the following components:   WBC 3.7 (*)    Hemoglobin 12.7 (*)    All other components within normal limits  ETHANOL  SALICYLATE LEVEL  RAPID URINE DRUG SCREEN, HOSP PERFORMED    EKG None  Radiology No results found.  Procedures Procedures (including critical care time)  Medications Ordered in ED Medications - No data to display   Initial Impression / Assessment and Plan / ED Course  I have reviewed the triage vital signs and the nursing notes.  Pertinent labs & imaging results that were available during my care of the patient were reviewed by me and considered in my medical decision making (see chart for details).    Unfortunately, because the patient is banned from [facility] his outpatient psychiatric follow-up is extremely limited.  I did have our Child psychotherapistsocial worker consult on the patient to try to set him up for outpatient assessment as he seems low risk.  The only places we were able to locate our [place] [facility] [place] which would be extremely difficult for this patient giving his homelessness at this point.  Patient is medically clear.  The case was also run by the social worker from behavioral health who states that he can [facility].  Patient will be discharged.  I feel comfortable giving this patient outpatient resources.  He was recently medically cleared 4 days ago.  He does not appear to be actively psychotic, is lucid and denies any homicidal or suicidal ideation.  I explained to the patient that we do not generally start people on medications here and manage their outpatient medicines, that if he wants to stop hearing voices he needs to get on the medicine and stay on the  medicine, and that his chronic mental issues should be managed in an outpatient setting.  Patient given bus pass to go directly to [facility].  He appears appropriate for discharge at this time  Final Clinical Impressions(s) / ED Diagnoses   Final diagnoses:  None    ED Discharge Orders    None       Arthor CaptainHarris, Quanesha Klimaszewski, PA-C 05/02/18 [id number]    Blane OharaZavitz, Joshua, MD 05/02/18 [phone]

## 2018-05-02 NOTE — Progress Notes (Addendum)
9:42am-CSW [facility] with [facility] and was informed [facility] for further evalution of mental health needs. CSW has provided pt with bus pass and resources at this time. There are no further CSW needs. CSW will sign off.   8:29am- CSW has reached out to a number of facilities to see what requirements are needed in order for pt to be seen at their clinics. CSW has spoken with representative and left messages for others to return call from CSW. CSW notified by PA that she is placing a TTS consult for further evaluation of needs at this time. CSW will follow to provide pt with needed resources.   CSW made aware pt PA that pt is in need of mental health resources. CSW spoke with pt at bedside and was informed that pt has been with [facility] in the past however was released as a result of a disagreement with someone there. CSW has spoken with pt and provided resources to pt. CSW will assist pt with calling facilties once they are open around 9 am. CSW will continue to follow for further needs.   Claude MangesKierra S. Zitlali Primm, MSW, LCSW-A Emergency Department Clinical Social Worker [phone]

## 2018-05-02 NOTE — ED Triage Notes (Addendum)
Patient states he has been hearing voices for several days, states they are saying to they want to kill him however he doesn't feel SI/HI

## 2018-05-02 NOTE — Discharge Instructions (Addendum)
Get help right away if: °Serious thoughts occur about self-harm or about hurting others. °There are serious side effects of medicine, such as: °Swelling of the face, lips, tongue, or throat. °Fever, confusion, muscle spasms, or seizures. °

## 2018-05-02 NOTE — ED Notes (Signed)
All belongings taken home with patient.

## 2018-05-08 ENCOUNTER — Encounter (HOSPITAL_COMMUNITY): Payer: Self-pay | Admitting: Emergency Medicine

## 2018-05-08 ENCOUNTER — Emergency Department (HOSPITAL_COMMUNITY)
Admission: EM | Admit: 2018-05-08 | Discharge: 2018-05-08 | Disposition: A | Discharge status: Home / Self Care | Payer: Self-pay | Attending: Emergency Medicine | Admitting: Emergency Medicine

## 2018-05-08 DIAGNOSIS — F209 Schizophrenia, unspecified: Secondary | ICD-10-CM | POA: Insufficient documentation

## 2018-05-08 DIAGNOSIS — R440 Auditory hallucinations: Secondary | ICD-10-CM

## 2018-05-08 DIAGNOSIS — F17210 Nicotine dependence, cigarettes, uncomplicated: Secondary | ICD-10-CM | POA: Insufficient documentation

## 2018-05-08 DIAGNOSIS — Z590 Homelessness: Secondary | ICD-10-CM | POA: Insufficient documentation

## 2018-05-08 DIAGNOSIS — Z79899 Other long term (current) drug therapy: Secondary | ICD-10-CM | POA: Insufficient documentation

## 2018-05-08 DIAGNOSIS — Z86711 Personal history of pulmonary embolism: Secondary | ICD-10-CM | POA: Insufficient documentation

## 2018-05-08 LAB — CBC WITH DIFFERENTIAL/PLATELET
ABS IMMATURE GRANULOCYTES: 0 10*3/uL (ref 0.0–0.1)
BASOS PCT: 1 %
Basophils Absolute: 0 10*3/uL (ref 0.0–0.1)
Eosinophils Absolute: 0.2 10*3/uL (ref 0.0–0.7)
Eosinophils Relative: 4 %
HCT: 39.7 % (ref 39.0–52.0)
HEMOGLOBIN: 12.5 g/dL — AB (ref 13.0–17.0)
Immature Granulocytes: 1 %
Lymphocytes Relative: 39 %
Lymphs Abs: 1.8 10*3/uL (ref 0.7–4.0)
MCH: 27.2 pg (ref 26.0–34.0)
MCHC: 31.5 g/dL (ref 30.0–36.0)
MCV: 86.5 fL (ref 78.0–100.0)
MONO ABS: 0.6 10*3/uL (ref 0.1–1.0)
MONOS PCT: 13 %
NEUTROS ABS: 1.9 10*3/uL (ref 1.7–7.7)
Neutrophils Relative %: 42 %
PLATELETS: 312 10*3/uL (ref 150–400)
RBC: 4.59 MIL/uL (ref 4.22–5.81)
RDW: 12.7 % (ref 11.5–15.5)
WBC: 4.5 10*3/uL (ref 4.0–10.5)

## 2018-05-08 LAB — SALICYLATE LEVEL

## 2018-05-08 LAB — ETHANOL: Alcohol, Ethyl (B): 118 mg/dL — ABNORMAL HIGH (ref <10)

## 2018-05-08 LAB — ACETAMINOPHEN LEVEL: Acetaminophen (Tylenol), Serum: <10 ug/mL — ABNORMAL LOW (ref 10–30)

## 2018-05-08 LAB — COMPREHENSIVE METABOLIC PANEL
ALT: 39 U/L (ref 17–63)
AST: 27 U/L (ref 15–41)
Albumin: 4 g/dL (ref 3.5–5.0)
Alkaline Phosphatase: 43 U/L (ref 38–126)
Anion gap: 12 (ref 5–15)
BILIRUBIN TOTAL: 0.6 mg/dL (ref 0.3–1.2)
BUN: 9 mg/dL (ref 6–20)
CALCIUM: 8.8 mg/dL — AB (ref 8.9–10.3)
CO2: 21 mmol/L — ABNORMAL LOW (ref 22–32)
CREATININE: 1.07 mg/dL (ref 0.61–1.24)
Chloride: 108 mmol/L (ref 101–111)
GFR calc non Af Amer: >60 mL/min (ref >60)
Glucose, Bld: 80 mg/dL (ref 65–99)
Potassium: 3.9 mmol/L (ref 3.5–5.1)
Sodium: 141 mmol/L (ref 135–145)
Total Protein: 7.1 g/dL (ref 6.5–8.1)

## 2018-05-08 NOTE — ED Provider Notes (Addendum)
[name] Provider Note   CSN: [id number] Arrival date & time: 05/08/18  0043     History   Chief Complaint Chief Complaint  Patient presents with  . Psychiatric Evaluation    HPI Cole Barrett is a 32 y.o. male.  Patient presents to the emergency department with a chief complaint of auditory hallucinations.  He reports that he has been hearing voices of his cousin, and that his voices have been telling him to kill himself.  He reports never having tried to commit suicide.  He denies any homicidal ideation.  He denies any drug or alcohol use.  He has not taken anything for symptoms.  He reports that he attempted to follow-up with family services, but was encouraged to come to the emergency department and go to behavioral health.  The history is provided by the patient. No language interpreter was used.    Past Medical History:  Diagnosis Date  . Homelessness   . Hypercholesterolemia   . Mental disorder   . Paranoid behavior (HCC)   . PE (pulmonary embolism)   . Schizophrenia [facility])     Patient Active Problem List   Diagnosis Date Noted  . 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 Alcohol abuse   . Auditory hallucinations   . Alcohol abuse with alcohol-induced mood disorder (HCC) 09/08/2016  . Homelessness 09/03/2016  . Person feigning illness 09/03/2016  . 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 Cannabis use disorder, mild, abuse 08/29/2016  . Pulmonary emboli (HCC) 07/13/2016  . 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 Acute left flank pain 01/02/2014  . Pulmonary embolism, bilateral (HCC) 01/02/2014    History reviewed. No pertinent surgical history.      Home Medications    Prior to Admission medications   Medication Sig Start Date End Date Taking? Authorizing Provider  paliperidone (INVEGA) 6 MG 24 hr tablet Take 1 tablet (6 mg total) by  mouth daily. 04/29/18   Charm RingsLord, Jamison Y, NP    Family History Family History  Problem Relation Age of Onset  . Hypertension Mother     Social History Social History   Tobacco Use  . Smoking status: Current Every Day Smoker    Packs/day: 0.50    Types: Cigarettes  . Smokeless tobacco: Never Used  Substance Use Topics  . Alcohol use: Yes    Comment: Last drink: yesterday   . Drug use: Yes    Types: Marijuana    Comment: Last used: 2 weeks ago     Allergies   Haldol [haloperidol]   Review of Systems Review of Systems  All other systems reviewed and are negative.    Physical Exam Updated Vital Signs BP 113/76 (BP Location: Right Arm)   Pulse 87   Temp 98.2 F (36.8 C) (Oral)   Resp 16   Ht 5\' 10"  (1.778 m)   Wt 99.8 kg (220 lb)   SpO2 98%   BMI 31.57 kg/m   Physical Exam  Constitutional: He is oriented to person, place, and time. No distress.  HENT:  Head: Normocephalic and atraumatic.  Eyes: Pupils are equal, round, and reactive to light. Conjunctivae and EOM are normal.  Neck: No tracheal deviation present.  Cardiovascular: Normal rate.  Pulmonary/Chest: Effort normal. No respiratory distress.  Abdominal: Soft.  Musculoskeletal: Normal range of motion.  Neurological: He is alert and oriented to person, place, and time.  Skin: Skin is warm and  dry. He is not diaphoretic.  Psychiatric: Judgment normal.  Nursing note and vitals reviewed.    ED Treatments / Results  Labs (all labs ordered are listed, but only abnormal results are displayed) Labs Reviewed - No data to display  EKG None  Radiology No results found.  Procedures Procedures (including critical care time)  Medications Ordered in ED Medications - No data to display   Initial Impression / Assessment and Plan / ED Course  I have reviewed the triage vital signs and the nursing notes.  Pertinent labs & imaging results that were available during my care of the patient were reviewed  by me and considered in my medical decision making (see chart for details).    Patient with auditory hallucinations.  Sent from family services to the ED for medical screening and placement.  Medically clear pending labs.  5:25 AM TTS recommends discharge.  Doesn't meet inpatient criteria.  Final Clinical Impressions(s) / ED Diagnoses   Final diagnoses:  Auditory hallucinations    ED Discharge Orders    None       Roxy Horseman, PA-C 05/08/18 0247    Roxy Horseman, PA-C 05/08/18 1610    Glynn Octave, MD 05/08/18 [phone]

## 2018-05-08 NOTE — ED Notes (Signed)
TTS in process-Monique,RN  

## 2018-05-08 NOTE — BH Assessment (Addendum)
Tele Assessment Note   Patient Name: Cole Barrett MRN: [id number] Referring Physician: Roxy Horsemanobert Browning, PA-C Location of Patient: [facility] Location of Provider: Behavioral Health TTS Department  Cole Barrett is an 32 y.o. male who [facility]. Pt reports he has been experiencing AH that tell him they are going to kill him. Pt has 25 ED visits in the past 6 months reporting similar concerns. Pt was recently assessed by TTS on 04/29/2018 due to similar concerns. Pt states he was "doing pretty good" for a while but states the AH returned and "got really bad." Pt denies any other stressors. Pt reports he is no longer homeless and states he lives in a house by himself. Pt reports he being unemployed. Pt denies SI/HI.  Pt is falling asleep with his eyes closed throughout the assessment. Pt denies any changes in appetite or sleeping habits. When asked how many hours of sleep the pt gets each night he states "it's straight." Pt denies using any substances, however per chart he has reported using marijuana and alcohol.  Pt denies legal history.  Pt denies any other stressors at present. Pt states he does not have a current provider and states [facility] but does not disclose the reason. Pt states he "just wants to go to a hospital so a doctor can watch over me" and "I just need to get back in Behavioral Health"  Diagnosis: F20.9 Schizophrenia, by history  Past Medical History:  Past Medical History:  Diagnosis Date  . Homelessness   . Hypercholesterolemia   . Mental disorder   . Paranoid behavior (HCC)   . PE (pulmonary embolism)   . Schizophrenia (HCC)     History reviewed. No pertinent surgical history.  Family History:  Family History  Problem Relation Age of Onset  . Hypertension Mother     Social History:  reports that he has been smoking cigarettes.  He has been smoking about 0.50 packs per day. He has never used  smokeless tobacco. He reports that he drinks alcohol. He reports that he has current or past drug history. Drug: Marijuana.  Additional Social History:  Alcohol / Drug Use Pain Medications: See MAR Prescriptions: See MAR Over the Counter: See MAR History of alcohol / drug use?: Yes(Pt denies using alcohol and drugs but her chart he has a history.) Substance #1 Name of Substance 1: Cannabis 1 - Age of First Use: teens 1 - Amount (size/oz): Varies 1 - Frequency: Occasional 1 - Duration: Ongoing 1 - Last Use / Amount: 04/28/18 Substance #2 Name of Substance 2: Alcohol 2 - Age of First Use: 16 2 - Amount (size/oz): Varies 2 - Frequency: Weekly 2 - Duration: Ongoing 2 - Last Use / Amount: Unknown  CIWA: CIWA-Ar BP: 113/76 Pulse Rate: 87 COWS:    Allergies:  Allergies  Allergen Reactions  . Haldol [Haloperidol] Shortness Of Breath    Home Medications:  (Not in a hospital admission)  OB/GYN Status:  No LMP for male patient.  General Assessment Data [facility] TTS Assessment: In system Is this a Tele or Face-to-Face Assessment?: Tele Assessment Is this an Initial Assessment or a Re-assessment for this encounter?: Initial Assessment Marital status: Single Maiden name: NA Is patient pregnant?: No Pregnancy Status: No Living Arrangements: Alone Can pt return to current living arrangement?: Yes Admission Status: Voluntary Is patient capable of signing voluntary admission?: Yes Referral Source: Psychiatrist Insurance type: Self pay  Crisis Care Plan Living Arrangements: Alone Name of Psychiatrist: None Name of Therapist: None  Education Status Is patient currently in school?: No Is the patient employed, unemployed or receiving disability?: Unemployed  Risk to self with the past 6 months Suicidal Ideation: No Has patient been a risk to self within the past 6 months prior to admission? : No Suicidal Intent: No Has patient had any suicidal  intent within the past 6 months prior to admission? : No Is patient at risk for suicide?: No Suicidal Plan?: No Has patient had any suicidal plan within the past 6 months prior to admission? : No Access to Means: No What has been your use of drugs/alcohol within the last 12 months?: Pt denies, but per chart pt uses marijuana and alcohol Previous Attempts/Gestures: No How many times?: 0 Other Self Harm Risks: Pt denies Triggers for Past Attempts: Other (Comment)(NA) Intentional Self Injurious Behavior: None Family Suicide History: No Recent stressful life event(s): Other (Comment)(Hearing voices) Persecutory voices/beliefs?: Yes Depression: No Depression Symptoms: (NA) Substance abuse history and/or treatment for substance abuse?: Yes Suicide prevention information given to non-admitted patients: Not applicable  Risk to Others within the past 6 months Homicidal Ideation: No Does patient have any lifetime risk of violence toward others beyond the six months prior to admission? : No Thoughts of Harm to Others: No Current Homicidal Intent: No Current Homicidal Plan: No Access to Homicidal Means: No Identified Victim: Pt denies History of harm to others?: No Assessment of Violence: None Noted Violent Behavior Description: Pt denies Does patient have access to weapons?: No Criminal Charges Pending?: No Describe Pending Criminal Charges: Pt denies Does patient have a court date: No Court Date: (Pt denies) Is patient on probation?: No  Psychosis Hallucinations: Auditory, With command Delusions: None noted  Mental Status Report Appearance/Hygiene: In scrubs, Unremarkable Eye Contact: Poor Motor Activity: Freedom of movement Speech: Slow Level of Consciousness: Drowsy Mood: Helpless Affect: Appropriate to circumstance Anxiety Level: None Thought Processes: Coherent, Relevant Judgement: Partial Orientation: Person, Place, Time, Situation, Appropriate for developmental  age Obsessive Compulsive Thoughts/Behaviors: None  Cognitive Functioning Concentration: Fair Memory: Recent Intact, Remote Intact Is patient IDD: No Is patient DD?: No Insight: Fair Impulse Control: Fair Appetite: Good Have you had any weight changes? : No Change Sleep: No Change Total Hours of Sleep: 8 Vegetative Symptoms: None  ADLScreening [facility] Assessment Services) Patient's cognitive ability adequate to safely complete daily activities?: Yes Patient able to express need for assistance with ADLs?: Yes Independently performs ADLs?: Yes (appropriate for developmental age)  Prior Inpatient Therapy Prior Inpatient Therapy: Yes Prior Therapy Dates: Per chart, January 2019; 10/2017; 08/2017. Prior Therapy Facilty/Provider(s): Per chart, [facility]. Reason for Treatment: Per chart, hallucinations.  Prior Outpatient Therapy Prior Outpatient Therapy: Yes Prior Therapy Dates: 2018 Prior Therapy Facilty/Provider(s): Per chart, [facility].  Reason for Treatment: Medication management.  Does patient have an ACCT team?: No Does patient have Intensive In-House Services?  : No Does patient have [facility] services? : No Does patient have P4CC services?: No  ADL Screening (condition at time of admission) Patient's cognitive ability adequate to safely complete daily activities?: Yes Is the patient deaf or have difficulty hearing?: No Does the patient have difficulty seeing, even when wearing glasses/contacts?: No Does the patient have difficulty concentrating, remembering, or making decisions?: No Patient able to express need for assistance with ADLs?: Yes Does the patient have difficulty dressing or bathing?: No Independently performs ADLs?: Yes (appropriate for developmental age) Does the patient have difficulty  walking or climbing stairs?: No Weakness of Legs: None Weakness of Arms/Hands: None       Abuse/Neglect Assessment (Assessment to be complete while patient is  alone) Abuse/Neglect Assessment Can Be Completed: Yes Physical Abuse: Denies Verbal Abuse: Denies Sexual Abuse: Denies Exploitation of patient/patient's resources: Denies Self-Neglect: Denies     Merchant navy officer (For Healthcare) Does Patient Have a Medical Advance Directive?: No Would patient like information on creating a medical advance directive?: No - Patient declined          Disposition: Gave clinical report to [name], NP who stated pt doesn't meet criteria for inpatient psychiatric treatment. Notified Monique, RN of recommendation. Disposition Initial Assessment Completed for this Encounter: Yes Patient referred to: Other (Comment)(Pt doesn't meet criteria for inpatient hospitalization)  This service was provided via telemedicine using a 2-way, interactive audio and video technology.  Names of all persons participating in this telemedicine service and their role in this encounter. Name: Loretha Brasil Role: Patient  Name: Annamaria Boots, MS, [facility] Role: TTS Counselor  Name:  Role:   Name:  Role:    Annamaria Boots, MS, [facility] Therapeutic Triage Specialist

## 2018-05-08 NOTE — Discharge Instructions (Signed)
You have been medically cleared.  Please follow-up as directed.

## 2018-05-08 NOTE — ED Notes (Signed)
Patient's belongings returned to him at discharge; Patient left discharge paperwork in room when he exited-Monique,RN

## 2018-05-08 NOTE — ED Notes (Signed)
Rob-PA advised that patient can be d/c; patient does not meet inpatient criteria per [facility]-Monique,RN

## 2018-05-08 NOTE — ED Triage Notes (Signed)
Pt seen here multiple times for same. Pt reports he is out of his meds and was told [facility] [place] [facility]... Pt states "they denies him and told him to come back here so he can [facility]" Denies HI/SI

## 2018-05-08 NOTE — ED Notes (Signed)
Pt's belongings removed and pt placed in purple scrubs.

## 2018-06-09 ENCOUNTER — Encounter (HOSPITAL_COMMUNITY): Payer: Self-pay

## 2018-06-09 ENCOUNTER — Other Ambulatory Visit: Payer: Self-pay

## 2018-06-09 ENCOUNTER — Emergency Department (HOSPITAL_COMMUNITY)
Admission: EM | Admit: 2018-06-09 | Discharge: 2018-06-09 | Disposition: A | Discharge status: Home / Self Care | Payer: Self-pay | Attending: Emergency Medicine | Admitting: Emergency Medicine

## 2018-06-09 DIAGNOSIS — F17210 Nicotine dependence, cigarettes, uncomplicated: Secondary | ICD-10-CM | POA: Insufficient documentation

## 2018-06-09 DIAGNOSIS — R440 Auditory hallucinations: Secondary | ICD-10-CM | POA: Insufficient documentation

## 2018-06-09 DIAGNOSIS — F1210 Cannabis abuse, uncomplicated: Secondary | ICD-10-CM | POA: Insufficient documentation

## 2018-06-09 MED ORDER — PALIPERIDONE ER 6 MG PO TB24: 6.0000 mg | ORAL_TABLET | Freq: Every day | ORAL | 0 refills | Status: DC

## 2018-06-09 MED ORDER — PALIPERIDONE ER 6 MG PO TB24
6.0000 mg | ORAL_TABLET | Freq: Every day | ORAL | Status: DC
  Administered 2018-06-09: 6 mg via ORAL
  Filled 2018-06-09: qty 1

## 2018-06-09 NOTE — Care Management Note (Signed)
Case Management Note  Patient Details  Name: Cole Barrett MRN: [id number] Date of Birth: 04/16/1986  Subjective/Objective:       Pt presented because he is unable to fill prescriptions.  He states he is not able to go to [name] anymore.             Action/Plan: MATCH letter given.  Pt states he walks for transportation.  Advised [facility] on [address].    Pt given clinic information for [facility], [facility], and [facility].  Pt agrees to call on Monday for appointment and understands that he can use pharmacy there if care is established.    Expected Discharge Date:      06/09/18           Expected Discharge Plan:   Home/self care  In-House Referral:   CM, CSW  Discharge planning Services   Medication assistance    Deveron Furlongshley  Gaetan Spieker, RN 06/09/2018, 5:22 PM

## 2018-06-09 NOTE — Progress Notes (Signed)
CSW spoke to Ashely from Case Management and she will meet with pt regarding primary care and getting his prescriptions filled. Garner NashGregory Victoriah Wilds, MSW, LCSW Clinical Social Worker 06/09/2018 4:49 PM

## 2018-06-09 NOTE — ED Provider Notes (Signed)
Patient in emergency department with auditory hallucinations.  Patient [place], but unable to afford it.  He was seen by prior provider and signed out to me pending social work or case manager consult to help with his medications.  Patient was seen by case manager, he was given a coupon for $3 match for his medicine.  He is not suicidal homicidal.  He stable for discharge home with outpatient follow-up.  Return precautions discussed.  Stable for discharge.  Vitals:   06/09/18 1141 06/09/18 1720  BP: 131/77 122/77  Pulse: 97 73  Resp: 16 18  Temp: 98.4 F (36.9 C) 98 F (36.7 C)  TempSrc: Oral Oral  SpO2: 99% 100%      Jaynie CrumbleKirichenko, Costantino Kohlbeck, PA-C 06/09/18 1745    Gerhard MunchLockwood, Robert, MD 06/10/18 0002

## 2018-06-09 NOTE — ED Triage Notes (Signed)
PT ambulatory to bed CH-6 . During assessment   of PT and when asked what meds he took and who his MD was. Pt reported he does not know nothing about nothing. Pt reported he does not know who his MD is .

## 2018-06-09 NOTE — ED Provider Notes (Signed)
[name] EMERGENCY DEPARTMENT Provider Note   CSN: [id number] Arrival date & time: 06/09/18  1111     History   Chief Complaint Chief Complaint  Patient presents with  . Hallucinations    HPI Cole Barrett is a 32 y.o. male with history of schizophrenia and homelessness presents for evaluation of chronic, acutely worsening auditory hallucinations.He has been seen 22 times in the past 6 months in the ED for similar complaints.  He states that his hallucinations acutely worsened today preventing him from going to work.  He states that his hallucinations are of multiple voices that say that they will kill him.  He presently denies any suicidal ideations, homicidal ideations, or visual hallucinations.  He does smoke cigarettes, denies recreational drug use, denies excessive alcohol intake.  Denies any medical complaints at this time.  He states that he ran out of his medications approximately 2 weeks ago.  He is unsure what medicine he was taking.  He did state that he was able to follow-up with the family services of [place] did not receive a refill of his medicine at that time.  He states that he has been living in a stable housing situation for the past few months.  The history is provided by the patient.    Past Medical History:  Diagnosis Date  . Homelessness   . Hypercholesterolemia   . Mental disorder   . Paranoid behavior (HCC)   . PE (pulmonary embolism)   . Schizophrenia [facility])     Patient Active Problem List   Diagnosis Date Noted  . 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 Alcohol abuse   . Auditory hallucinations   . Alcohol abuse with alcohol-induced mood disorder (HCC) 09/08/2016  . Homelessness 09/03/2016  . Person feigning illness 09/03/2016  . 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 Cannabis use disorder, mild, abuse 08/29/2016  . Pulmonary emboli (HCC) 07/13/2016  . Pulmonary  embolism and infarction (HCC) 01/02/2014  . Acute left flank pain 01/02/2014  . Pulmonary embolism, bilateral (HCC) 01/02/2014    History reviewed. No pertinent surgical history.      Home Medications    Prior to Admission medications   Medication Sig Start Date End Date Taking? Authorizing Provider  paliperidone (INVEGA) 6 MG 24 hr tablet Take 1 tablet (6 mg total) by mouth daily. 06/09/18   Jeanie Sewer, PA-C    Family History Family History  Problem Relation Age of Onset  . Hypertension Mother     Social History Social History   Tobacco Use  . Smoking status: Current Every Day Smoker    Packs/day: 0.50    Types: Cigarettes  . Smokeless tobacco: Never Used  Substance Use Topics  . Alcohol use: Yes    Comment: Last drink: yesterday   . Drug use: Yes    Types: Marijuana    Comment: Last used: 2 weeks ago     Allergies   Haldol [haloperidol]   Review of Systems Review of Systems  Constitutional: Negative for chills and fever.  Cardiovascular: Negative for chest pain.  Gastrointestinal: Negative for abdominal pain, nausea and vomiting.  Psychiatric/Behavioral: Positive for hallucinations. Negative for self-injury and suicidal ideas.  All other systems reviewed and are negative.    Physical Exam Updated Vital Signs BP 131/77 (BP Location: Right Arm)   Pulse 97   Temp 98.4 F (36.9 C) (Oral)   Resp 16   SpO2 99%   Physical  Exam  Constitutional: He appears well-developed and well-nourished. No distress.  Resting comfortably in bed, no apparent distress  HENT:  Head: Normocephalic and atraumatic.  Eyes: Conjunctivae are normal. Right eye exhibits no discharge. Left eye exhibits no discharge.  Neck: No JVD present. No tracheal deviation present.  Cardiovascular: Normal rate, regular rhythm and normal heart sounds.  Pulmonary/Chest: Effort normal and breath sounds normal.  Abdominal: Soft. Bowel sounds are normal. He exhibits no distension. There is no  tenderness. There is no guarding.  Musculoskeletal: He exhibits no edema.  Neurological: He is alert.  Skin: Skin is warm and dry. No erythema.  Psychiatric: He has a normal mood and affect. His behavior is normal. He is actively hallucinating. He expresses no homicidal and no suicidal ideation. He expresses no suicidal plans and no homicidal plans.  Nursing note and vitals reviewed.    ED Treatments / Results  Labs (all labs ordered are listed, but only abnormal results are displayed) Labs Reviewed - No data to display  EKG None  Radiology No results found.  Procedures Procedures (including critical care time)  Medications Ordered in ED Medications  paliperidone (INVEGA) 24 hr tablet 6 mg (6 mg Oral Given 06/09/18 1529)     Initial Impression / Assessment and Plan / ED Course  I have reviewed the triage vital signs and the nursing notes.  Pertinent labs & imaging results that were available during my care of the patient were reviewed by me and considered in my medical decision making (see chart for details).     Patient presents for evaluation of auditory hallucinations.  This is a chronic complaint for him and he has been seen for this multiple times in the past 6 months.  He is afebrile, vital signs are stable.  He does not appear to be acutely psychotic.  He is nontoxic in appearance.  He is answering questions appropriately, cooperative with examination.  He denies suicidal ideation or homicidal ideation.  He has never attempted suicide in the past.  He does not appear to be a danger to himself or others at this time.  Chart review shows that on 04/29/2018 he was prescribed a 1 month supply of Invega 6 mg tablets which he states have been working well for him.  He does not wish to be evaluated by TTS or treated on an inpatient basis psychiatrically if he can have his medications refilled.  He was given 1 tablet of his medicine while in the ED.  Case management and social work  have been consulted to attempt to find a way to get the patient his medication affordably.  5:19 PM Signed out to oncoming provider PA Kirichenko.  Awaiting case management and social work consultations for medication assistance.  Final Clinical Impressions(s) / ED Diagnoses   Final diagnoses:  Auditory hallucination    ED Discharge Orders        Ordered    paliperidone (INVEGA) 6 MG 24 hr tablet  Daily     06/09/18 1640       Jeanie SewerFawze, Gregary Blackard A, PA-C 06/09/18 1720    Margarita Grizzleay, Danielle, MD 06/10/18 [phone]

## 2018-06-09 NOTE — ED Triage Notes (Signed)
Pt presents for evaluation auditory hallucinations. Denies SI/HI. The voices are telling him that they are gonna kill him. States he ran out of his meds.

## 2018-06-09 NOTE — ED Notes (Signed)
Declined W/C at D/C and was escorted to lobby by RN. 

## 2018-06-11 IMAGING — CR DG SHOULDER 2+V*L*
2 series · 2 of 2 positions shown · non-contrast
Comparison: None.

CLINICAL DATA: Pain after straining his shoulder yesterday

EXAM:
LEFT SHOULDER - 2+ VIEW

[shoulder grashey]
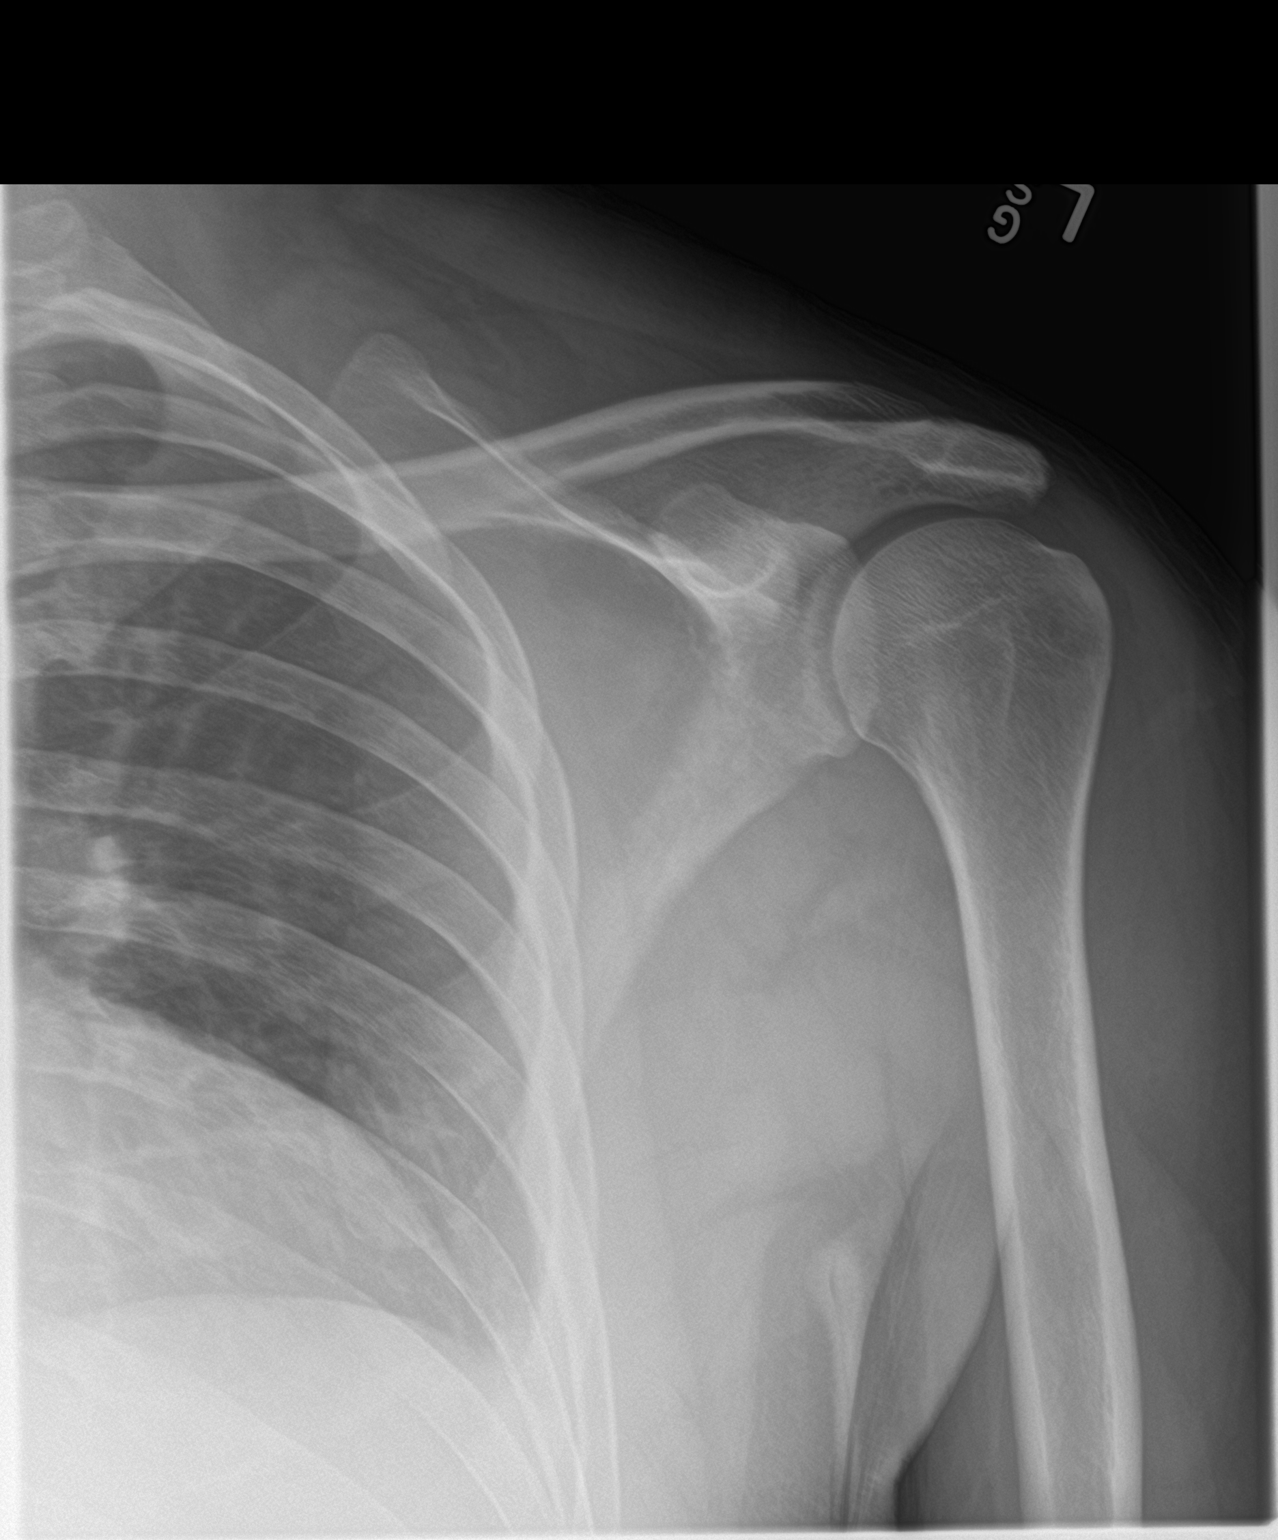

[shoulder y view]
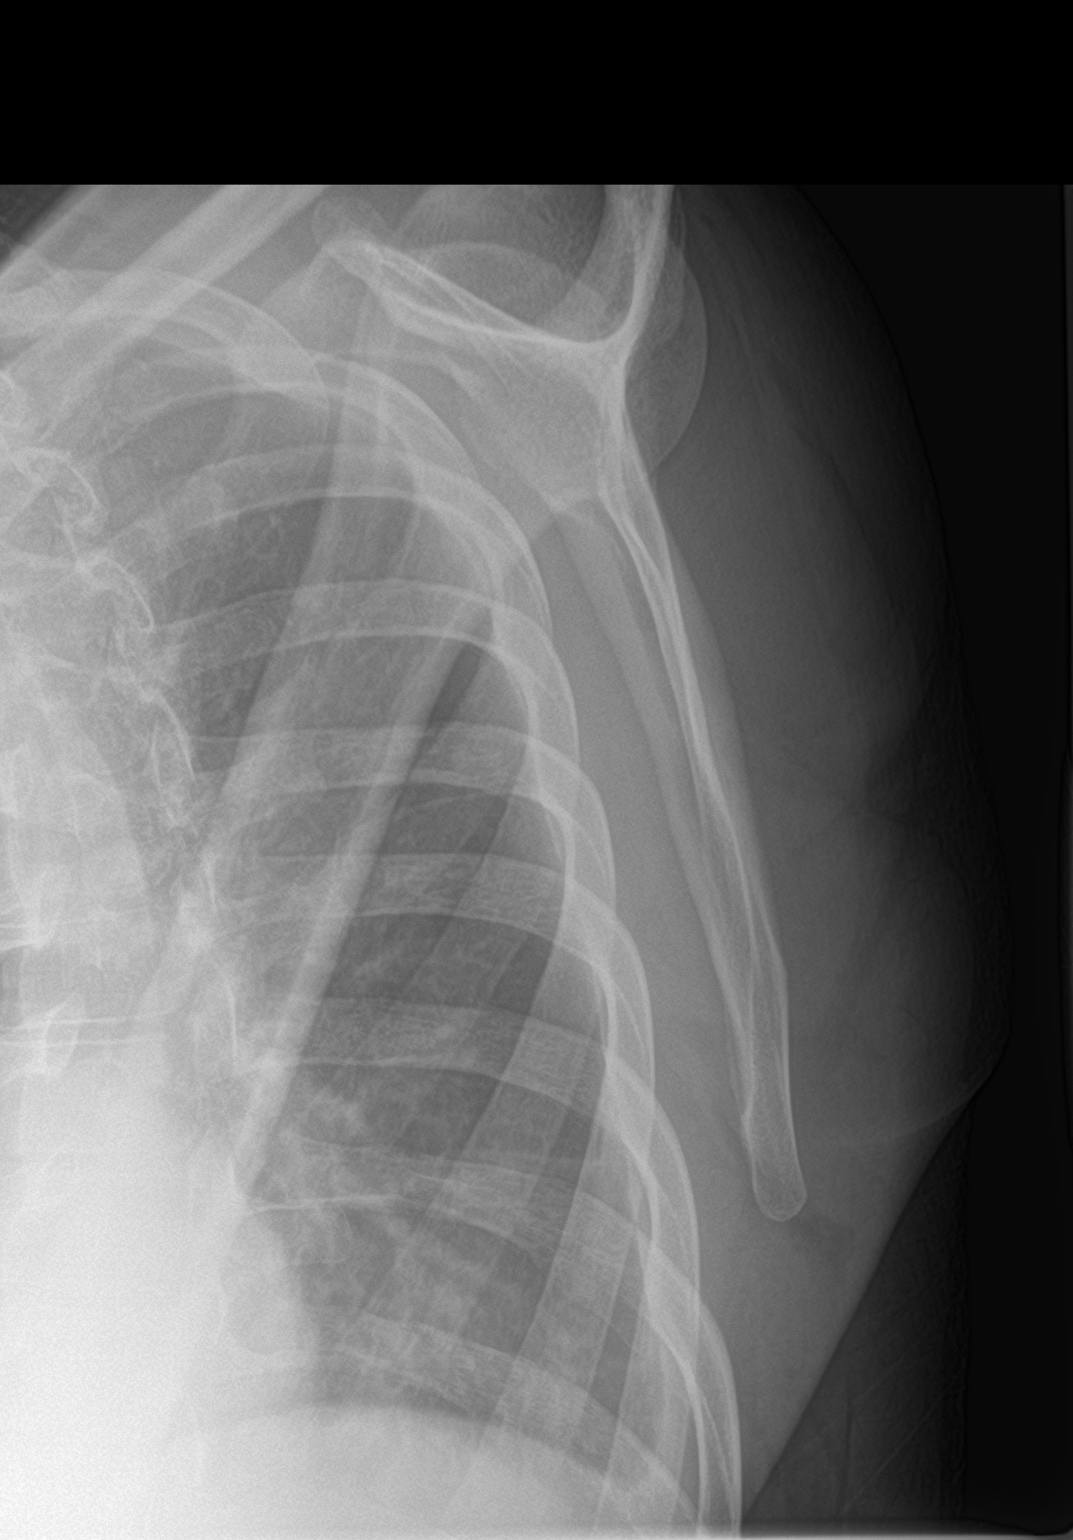

[2 of 2 positions shown; findings below may reference images not displayed]

FINDINGS: There is no evidence of fracture or dislocation. There is no
evidence of arthropathy or other focal bone abnormality. Soft
tissues are unremarkable.
IMPRESSION: Negative.

## 2018-07-10 ENCOUNTER — Encounter (HOSPITAL_COMMUNITY): Payer: Self-pay

## 2018-07-10 ENCOUNTER — Emergency Department (HOSPITAL_COMMUNITY)
Admission: EM | Admit: 2018-07-10 | Discharge: 2018-07-11 | Disposition: A | Discharge status: Home / Self Care | Payer: Self-pay | Attending: Emergency Medicine | Admitting: Emergency Medicine

## 2018-07-10 DIAGNOSIS — R440 Auditory hallucinations: Secondary | ICD-10-CM

## 2018-07-10 DIAGNOSIS — F209 Schizophrenia, unspecified: Secondary | ICD-10-CM | POA: Insufficient documentation

## 2018-07-10 DIAGNOSIS — Z590 Homelessness: Secondary | ICD-10-CM | POA: Insufficient documentation

## 2018-07-10 DIAGNOSIS — Z79899 Other long term (current) drug therapy: Secondary | ICD-10-CM | POA: Insufficient documentation

## 2018-07-10 DIAGNOSIS — F17210 Nicotine dependence, cigarettes, uncomplicated: Secondary | ICD-10-CM | POA: Insufficient documentation

## 2018-07-10 LAB — COMPREHENSIVE METABOLIC PANEL
ALT: 36 U/L (ref 0–44)
ANION GAP: 10 (ref 5–15)
AST: 28 U/L (ref 15–41)
Albumin: 3.8 g/dL (ref 3.5–5.0)
Alkaline Phosphatase: 44 U/L (ref 38–126)
BUN: 9 mg/dL (ref 6–20)
CHLORIDE: 105 mmol/L (ref 98–111)
CO2: 25 mmol/L (ref 22–32)
CREATININE: 1.16 mg/dL (ref 0.61–1.24)
Calcium: 8.6 mg/dL — ABNORMAL LOW (ref 8.9–10.3)
GFR calc non Af Amer: >60 mL/min (ref >60)
Glucose, Bld: 85 mg/dL (ref 70–99)
Potassium: 3.7 mmol/L (ref 3.5–5.1)
Sodium: 140 mmol/L (ref 135–145)
Total Bilirubin: 0.5 mg/dL (ref 0.3–1.2)
Total Protein: 6.7 g/dL (ref 6.5–8.1)

## 2018-07-10 LAB — CBC
HEMATOCRIT: 39 % (ref 39.0–52.0)
Hemoglobin: 12.4 g/dL — ABNORMAL LOW (ref 13.0–17.0)
MCH: 27.6 pg (ref 26.0–34.0)
MCHC: 31.8 g/dL (ref 30.0–36.0)
MCV: 86.7 fL (ref 78.0–100.0)
PLATELETS: 290 10*3/uL (ref 150–400)
RBC: 4.5 MIL/uL (ref 4.22–5.81)
RDW: 13 % (ref 11.5–15.5)
WBC: 3.4 10*3/uL — AB (ref 4.0–10.5)

## 2018-07-10 LAB — ETHANOL

## 2018-07-10 MED ORDER — RISPERIDONE 1 MG PO TABS
1.0000 mg | ORAL_TABLET | Freq: Two times a day (BID) | ORAL | Status: DC
  Administered 2018-07-10 – 2018-07-11 (×3): 1 mg via ORAL
  Filled 2018-07-10 (×3): qty 1

## 2018-07-10 MED ORDER — ZIPRASIDONE MESYLATE 20 MG IM SOLR: 20.0000 mg | INTRAMUSCULAR | Status: DC | PRN

## 2018-07-10 MED ORDER — ACETAMINOPHEN 325 MG PO TABS: 650.0000 mg | ORAL_TABLET | ORAL | Status: DC | PRN

## 2018-07-10 MED ORDER — LORAZEPAM 1 MG PO TABS: 1.0000 mg | ORAL_TABLET | ORAL | Status: DC | PRN

## 2018-07-10 MED ORDER — RISPERIDONE 2 MG PO TBDP
2.0000 mg | ORAL_TABLET | Freq: Three times a day (TID) | ORAL | Status: DC | PRN
  Filled 2018-07-10: qty 1

## 2018-07-10 MED ORDER — ONDANSETRON HCL 4 MG PO TABS: 4.0000 mg | ORAL_TABLET | Freq: Three times a day (TID) | ORAL | Status: DC | PRN

## 2018-07-10 MED ORDER — BENZTROPINE MESYLATE 1 MG PO TABS
0.5000 mg | ORAL_TABLET | Freq: Two times a day (BID) | ORAL | Status: DC
  Administered 2018-07-10 – 2018-07-11 (×4): 0.5 mg via ORAL
  Filled 2018-07-10 (×4): qty 1

## 2018-07-10 NOTE — ED Notes (Signed)
Pt changed in to paper scrubs and wanded by security. 

## 2018-07-10 NOTE — ED Triage Notes (Signed)
Pt states that he is hearing voices again staying they are going to hear him, denies SI/HI, pt reports compliance with medication

## 2018-07-10 NOTE — ED Provider Notes (Signed)
3:22 PM [facility] team called to report that they are requesting medication change and reassessment tomorrow.  They ordered the patient to get Risperdal 1 mg twice a day and Cogentin 0.5 mg twice a day.  Plan is to reassess in the morning and likely be able to discharge him.  They made the medication changes.  No other complaints at this time.   Tegeler, Canary Brimhristopher J, MD 07/10/18 [phone]

## 2018-07-10 NOTE — [facility] Counselor (Signed)
TTS attempted consult with patient. The battery in the remote is not working therefore the tele-psych machine cannot be answered. Waiting on someone to change the battery in the tele-psych remote.   TTS will attempt to see patient later the day.

## 2018-07-10 NOTE — Consult Note (Signed)
  Medication recommendation  [facility] complaints of auditory hallucinations and being off of his medication for several months.  Patient denies suicidal/homicidal ideation and paranoia.    Patient has medication history of Risperdal 3 mg Q hs an Invega 6 mg daily  Recommendation:   EKG; Start Risperdal 1 mg Bid and Cogentin 0.5 mg Bid.  Monitor patient overnight.  Patient may be discharged tomorrow after he has started his medication; no adverse reaction.  Patietn [facility] to [facility].   Patient will need a prescription for 1-2 weeks until he can get in to see psychiatrist for medication management.  Disposition:  Patient psychiatrically cleared for discharge tomorrow morning no adverse reaction to medication; and no changes No evidence of imminent risk to self or others at present.   Recommend psychiatric Inpatient admission when medically cleared. Supportive therapy provided about ongoing stressors. Refer to IOP. Discussed crisis plan, support from social network, calling 911, coming to the Emergency Department, and calling Suicide Hotline.     Medication orders entered; and EKG also ordered  Will need prescription for medication 1-2 weeks worth.  Spoke with Dr. Rush Landmarkegeler; informed of above recommendation and disposition  Shuvon B. Rankin, NP

## 2018-07-10 NOTE — ED Notes (Signed)
Pt resting.

## 2018-07-10 NOTE — ED Notes (Signed)
PT continues to rest

## 2018-07-10 NOTE — ED Notes (Signed)
Pt brought back to hallway bed, laid down in bed, given blanket, now sleeping, no complaints.

## 2018-07-10 NOTE — BH Assessment (Signed)
Tele Assessment Note   Patient Name: Cole HawkingChristopher Adam Mccollom MRN: [id number] Referring Physician: Dr. Derwood KaplanAnkit Nanavati, MD [facility] Location of Provider: Behavioral Health TTS Department  Cole Barrett is a 32 y.o. male [facility] due to hearing voices stating they were going to kill him while on his way to work this morning. Pt states he has heard voices in the past, though he has not head them in several months and that "things were going good." Pt is unable to identify what could have changed/any incident that could have occurred to make the voices start again this morning. Pt denies SA, which could have attributed to [facility] is experiencing.  Pt denies SI, any hx of SI, or any suicide attempts. He denies any hospitalizations due to SI but is unsure if [facility]'s [facility] for other reasons. Pt denies HI, VH, or NSSIB.  Pt shares he is single and lives by himself. Hospital records report pt is homeless, though pt did not report this. Pt denies access to weapons. He denies any involvement with the court system.  Pt states he has never had a therapist nor a psychiatrist. He reported to the hospital that he has been medication-compliant, though he told clinician that he is not currently on medication. When clinician inquired as to how long pt has been off of his medication, pt stated he was unsure. Clinician inquired as to who prescribed his medication and pt again stated he was unsure, and then stated he believes [facility] have [facility] him medication in the past, though he's not sure how long ago that may have been.  Pt denies any exposure to abuse. He denies any family history of SI, MH, or SA. When pt was asked about support, pt states he has "nobody." Pt shares he sleeps "good" and gets an average of 7 hours of sleep per night. He states his appetite is "fine" and that he has no unexplained weight loss or gain.  Pt is  oriented x4. His recent memory is intact, though his remote memory is impaired. Pt was cooperative throughout the assessment. His judgement, insight, and impulse control is impaired at this time.   Diagnosis: F20.9, Schizophrenia  Past Medical History:  Past Medical History:  Diagnosis Date  . Homelessness   . Hypercholesterolemia   . Mental disorder   . Paranoid behavior (HCC)   . PE (pulmonary embolism)   . Schizophrenia (HCC)     History reviewed. No pertinent surgical history.  Family History:  Family History  Problem Relation Age of Onset  . Hypertension Mother     Social History:  reports that he has been smoking cigarettes. He has been smoking about 0.50 packs per day. He has never used smokeless tobacco. He reports that he drinks alcohol. He reports that he has current or past drug history. Drug: Marijuana.  Additional Social History:  Alcohol / Drug Use Pain Medications: Please see MAR Prescriptions: Please see MAR Over the Counter: Please see MAR History of alcohol / drug use?: No history of alcohol / drug abuse Longest period of sobriety (when/how long): N/A  CIWA: CIWA-Ar BP: (!) 147/88 Pulse Rate: 65 COWS:    Allergies:  Allergies  Allergen Reactions  . Haldol [Haloperidol] Shortness Of Breath    Home Medications:  (Not in a hospital admission)  OB/GYN Status:  No LMP for male patient.  General Assessment Data Assessment unable to be completed: Yes  Reason for not completing assessment: Patient is located in the hallway  [facility] Assessment: In system Is this a Tele or Face-to-Face Assessment?: Face-to-Face Is this an Initial Assessment or a Re-assessment for this encounter?: Initial Assessment Marital status: Single Maiden name: Laural BenesJohnson Is patient pregnant?: No Pregnancy Status: No Living Arrangements: Alone, Other (Comment)(Hosp records state pt is homeless; pt doesn't report this) Can pt return to current living  arrangement?: Yes Admission Status: Voluntary Is patient capable of signing voluntary admission?: Yes Referral Source: Self/Family/Friend Insurance type: None     Crisis Care Plan Living Arrangements: Alone, Other (Comment)(Hosp records state pt is homeless; pt doesn't report this) Legal Guardian: Other:(N/A) Name of Psychiatrist: None reported Name of Therapist: None reported  Education Status Is patient currently in school?: No Is the patient employed, unemployed or receiving disability?: Employed  Risk to self with the past 6 months Suicidal Ideation: No Has patient been a risk to self within the past 6 months prior to admission? : No Suicidal Intent: No Has patient had any suicidal intent within the past 6 months prior to admission? : No Is patient at risk for suicide?: No Suicidal Plan?: No Has patient had any suicidal plan within the past 6 months prior to admission? : No Access to Means: No What has been your use of drugs/alcohol within the last 12 months?: Pt denies Previous Attempts/Gestures: No(Pt denies) How many times?: 0 Other Self Harm Risks: None noted Triggers for Past Attempts: None known Intentional Self Injurious Behavior: None Family Suicide History: No Recent stressful life event(s): Other (Comment)(Pt began hearing voices this morning) Persecutory voices/beliefs?: Yes(Pt hears voices saying they're going to kill him) Depression: No Depression Symptoms: (Pt denies) Substance abuse history and/or treatment for substance abuse?: No Suicide prevention information given to non-admitted patients: Not applicable  Risk to Others within the past 6 months Homicidal Ideation: No Does patient have any lifetime risk of violence toward others beyond the six months prior to admission? : No Thoughts of Harm to Others: No Current Homicidal Intent: No Current Homicidal Plan: No Access to Homicidal Means: No Identified Victim: None noted History of harm to others?:  No Assessment of Violence: On admission Violent Behavior Description: None noted Does patient have access to weapons?: No(Pt denies) Criminal Charges Pending?: No Does patient have a court date: No Is patient on probation?: No  Psychosis Hallucinations: Auditory Delusions: None noted  Mental Status Report Appearance/Hygiene: In scrubs, Unremarkable Eye Contact: Fair Motor Activity: Unremarkable(Pt is lying in hospital bed) Speech: Logical/coherent, Soft Level of Consciousness: Quiet/awake Mood: Anhedonia Affect: Appropriate to circumstance, Blunted Anxiety Level: None Thought Processes: Coherent, Relevant Judgement: Partial Orientation: Person, Place, Time, Situation Obsessive Compulsive Thoughts/Behaviors: Minimal  Cognitive Functioning Concentration: Decreased Memory: Recent Intact, Remote Impaired Is patient IDD: No Is patient DD?: No Insight: Fair Impulse Control: Fair Appetite: Good Have you had any weight changes? : No Change Sleep: No Change Total Hours of Sleep: 7 Vegetative Symptoms: None  ADLScreening [facility] Assessment Services) Patient's cognitive ability adequate to safely complete daily activities?: Yes Patient able to express need for assistance with ADLs?: Yes Independently performs ADLs?: Yes (appropriate for developmental age)  Prior Inpatient Therapy Prior Inpatient Therapy: Yes Prior Therapy Dates: Multiple, though pt cannot recall when Prior Therapy Facilty/Provider(s): Unknown Reason for Treatment: Schizophrenia  Prior Outpatient Therapy Prior Outpatient Therapy: Yes Prior Therapy Dates: Pt unable to recall Prior Therapy Facilty/Provider(s): [facility] Reason for Treatment: Schizophrenia Does  patient have an ACCT team?: No Does patient have Intensive In-House Services?  : No Does patient have [facility] services? : No Does patient have P4CC services?: No  ADL Screening (condition at time of  admission) Patient's cognitive ability adequate to safely complete daily activities?: Yes Is the patient deaf or have difficulty hearing?: No Does the patient have difficulty seeing, even when wearing glasses/contacts?: No Does the patient have difficulty concentrating, remembering, or making decisions?: No Patient able to express need for assistance with ADLs?: Yes Does the patient have difficulty dressing or bathing?: No Independently performs ADLs?: Yes (appropriate for developmental age) Does the patient have difficulty walking or climbing stairs?: No Weakness of Legs: None Weakness of Arms/Hands: None     Therapy Consults (therapy consults require a physician order) PT Evaluation Needed: No OT Evalulation Needed: No SLP Evaluation Needed: No Abuse/Neglect Assessment (Assessment to be complete while patient is alone) Abuse/Neglect Assessment Can Be Completed: Yes Physical Abuse: Denies Verbal Abuse: Denies Sexual Abuse: Denies Exploitation of patient/patient's resources: Denies Self-Neglect: Denies Values / Beliefs Cultural Requests During Hospitalization: None Spiritual Requests During Hospitalization: None Consults Spiritual Care Consult Needed: No Social Work Consult Needed: No Merchant navy officer (For Healthcare) Does Patient Have a Medical Advance Directive?: No Would patient like information on creating a medical advance directive?: No - Patient declined       Disposition: Shuvon Rankin NP reviewed pt's chart and information and determined that pt should be re-started on his medication and monitored overnight for safety and stability. Pt can be d/c in the morning pending no difficulties with his medication and should be d/c with 2 weeks of prescriptions for his medications.   Disposition Initial Assessment Completed for this Encounter: Yes Patient referred to: Other (Comment)(Pt will be observed overnight & d/c in AM with scripts)  This service was provided via  telemedicine using a 2-way, interactive audio and video technology.  Names of all persons participating in this telemedicine service and their role in this encounter. Name: Dalyn Becker Role: Patient  Name: Duard Brady Role: Clinician    Ralph Dowdy 07/10/2018 2:29 PM

## 2018-07-10 NOTE — ED Notes (Signed)
Pt moved over to POD F, calm and cooperative.

## 2018-07-10 NOTE — ED Notes (Signed)
Lunch tray arrived.   

## 2018-07-10 NOTE — BHH Counselor (Addendum)
TTS writer contacted F-pod to requesting to see patient to complete initial assessment (12:56pm). TTS writer was informed the tele-psych machine was working and Psychiatric nurseTTS writer would be able to see the patient. TTS called tele-psych machine MC-cart 1 at 1:06pm. Received no answer.   TTS will see patient face/face. TTS writer [facility] K [facility] and complete the assessment face/face.

## 2018-07-10 NOTE — ED Provider Notes (Signed)
[name] Provider Note   CSN: [id number] Arrival date & time: 07/10/18  0429     History   Chief Complaint Chief Complaint  Patient presents with  . Hallucinations    HPI Cole Barrett is a 32 y.o. male.  HPI 32 year old male comes in with chief complaint of hallucinations. Patient has history of schizophrenia and reports that he has been taking his medications as prescribed, however yesterday he started hearing voices again.  The voices are loud.  Patient denies any SI, HI.  He does not have outpatient psychiatrist, and [facility].  Past Medical History:  Diagnosis Date  . Homelessness   . Hypercholesterolemia   . Mental disorder   . Paranoid behavior (HCC)   . PE (pulmonary embolism)   . Schizophrenia [facility](HCC)     Patient Active Problem List   Diagnosis Date Noted  . 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 Alcohol abuse   . Auditory hallucinations   . Alcohol abuse with alcohol-induced mood disorder (HCC) 09/08/2016  . Homelessness 09/03/2016  . Person feigning illness 09/03/2016  . 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 Cannabis use disorder, mild, abuse 08/29/2016  . Pulmonary emboli (HCC) 07/13/2016  . 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 Acute left flank pain 01/02/2014  . Pulmonary embolism, bilateral (HCC) 01/02/2014    History reviewed. No pertinent surgical history.      Home Medications    Prior to Admission medications   Medication Sig Start Date End Date Taking? Authorizing Provider  risperiDONE (RISPERDAL) 3 MG tablet Take 3 mg by mouth at bedtime.   Yes [provider]  paliperidone (INVEGA) 6 MG 24 hr tablet Take 1 tablet (6 mg total) by mouth daily. Patient not taking: Reported on 07/10/2018 06/09/18   Jeanie SewerFawze, Mina A, PA-C    Family History Family History  Problem  Relation Age of Onset  . Hypertension Mother     Social History Social History   Tobacco Use  . Smoking status: Current Every Day Smoker    Packs/day: 0.50    Types: Cigarettes  . Smokeless tobacco: Never Used  Substance Use Topics  . Alcohol use: Yes    Comment: Last drink: yesterday   . Drug use: Yes    Types: Marijuana    Comment: Last used: 2 weeks ago     Allergies   Haldol [haloperidol]   Review of Systems Review of Systems  Constitutional: Positive for activity change.  Respiratory: Negative for shortness of breath.   Cardiovascular: Negative for chest pain.  Gastrointestinal: Negative for abdominal pain.  Psychiatric/Behavioral: Positive for hallucinations. Negative for suicidal ideas. The patient is not nervous/anxious.      Physical Exam Updated Vital Signs BP (!) 147/82   Pulse 98   Temp 98.7 F (37.1 C) (Oral)   Resp 20   SpO2 100%   Physical Exam  Constitutional: He is oriented to person, place, and time. He appears well-developed.  HENT:  Head: Atraumatic.  Neck: Neck supple.  Cardiovascular: Normal rate.  Pulmonary/Chest: Effort normal.  Neurological: He is alert and oriented to person, place, and time.  Skin: Skin is warm.  Psychiatric: He has a normal mood and affect. His behavior is normal. Judgment normal.  Nursing note and vitals reviewed.    ED Treatments / Results  Labs (all labs ordered are listed, but only abnormal results  are displayed) Labs Reviewed  COMPREHENSIVE METABOLIC PANEL - Abnormal; Notable for the following components:      Result Value   Calcium 8.6 (*)    All other components within normal limits  CBC - Abnormal; Notable for the following components:   WBC 3.4 (*)    Hemoglobin 12.4 (*)    All other components within normal limits  ETHANOL  RAPID URINE DRUG SCREEN, HOSP PERFORMED    EKG None  Radiology No results found.  Procedures Procedures (including critical care time)  Medications Ordered in  ED Medications  ondansetron (ZOFRAN) tablet 4 mg (has no administration in time range)  acetaminophen (TYLENOL) tablet 650 mg (has no administration in time range)  risperiDONE (RISPERDAL M-TABS) disintegrating tablet 2 mg (has no administration in time range)    And  LORazepam (ATIVAN) tablet 1 mg (has no administration in time range)    And  ziprasidone (GEODON) injection 20 mg (has no administration in time range)     Initial Impression / Assessment and Plan / ED Course  I have reviewed the triage vital signs and the nursing notes.  Pertinent labs & imaging results that were available during my care of the patient were reviewed by me and considered in my medical decision making (see chart for details).     32 year old male with history of schizophrenia comes in with chief complaint of worsening auditory hallucinations.  Patient reports that he was symptom-free for several days, but his symptoms of hallucinations returned yesterday despite him taking his medications as prescribed.  Patient has no SI, HI. We will see if TTS can see the patient for any med reconciliation or set him up for appropriate outpatient follow-up since he does not have a psychiatrist.  We do not think patient needs admission, if he can be managed as an outpatient.  Final Clinical Impressions(s) / ED Diagnoses   Final diagnoses:  Auditory hallucination    ED Discharge Orders    None       Derwood KaplanNanavati, Evamarie Raetz, MD 07/10/18 [phone]

## 2018-07-10 NOTE — BHH Counselor (Signed)
Patient was submitted for a TTS consult. Patient is currently located in a bed in the hallway. TTS will conduct consult once patient is placed in a room. Time has been stopped.

## 2018-07-11 MED ORDER — PALIPERIDONE ER 6 MG PO TB24: 6.0000 mg | ORAL_TABLET | Freq: Every day | ORAL | Status: DC

## 2018-07-11 MED ORDER — RISPERIDONE 1 MG PO TABS: 1.0000 mg | ORAL_TABLET | Freq: Every day | ORAL | Status: DC

## 2018-07-11 NOTE — ED Notes (Signed)
Pt ambulated to the bathroom.  

## 2018-07-11 NOTE — Discharge Instructions (Addendum)
Cleared by behavioral health for discharge home and outpatient care.

## 2018-07-11 NOTE — ED Notes (Signed)
Patient given discharge instructions and verbalized understanding.  Patient stable to discharge at this time.  Patient is alert and oriented to baseline.  No distressed noted at this time.  All belongings taken with the patient at discharge.   

## 2018-07-11 NOTE — ED Notes (Addendum)
Regular Diet ordered for Breakfast. 

## 2018-07-11 NOTE — Progress Notes (Signed)
Patient is well known to this ED and providers.  He comes to the ED with alleged hallucinations but never appears to be responding to internal stimuli.  Thayer OhmChris wants his medications which are started but refuses to follow up with outpatient and comes to the ED for medications.  Nanine MeansJamison Ariyan Brisendine, PMHNP

## 2018-07-25 ENCOUNTER — Other Ambulatory Visit: Payer: Self-pay

## 2018-07-25 ENCOUNTER — Emergency Department (HOSPITAL_COMMUNITY)
Admission: EM | Admit: 2018-07-25 | Discharge: 2018-07-25 | Disposition: A | Discharge status: Home / Self Care | Payer: Self-pay | Attending: Emergency Medicine | Admitting: Emergency Medicine

## 2018-07-25 ENCOUNTER — Encounter (HOSPITAL_COMMUNITY): Payer: Self-pay | Admitting: Emergency Medicine

## 2018-07-25 DIAGNOSIS — R440 Auditory hallucinations: Secondary | ICD-10-CM

## 2018-07-25 DIAGNOSIS — F17210 Nicotine dependence, cigarettes, uncomplicated: Secondary | ICD-10-CM | POA: Insufficient documentation

## 2018-07-25 DIAGNOSIS — Z79899 Other long term (current) drug therapy: Secondary | ICD-10-CM | POA: Insufficient documentation

## 2018-07-25 DIAGNOSIS — F209 Schizophrenia, unspecified: Secondary | ICD-10-CM | POA: Insufficient documentation

## 2018-07-25 LAB — CBC
HEMATOCRIT: 39.1 % (ref 39.0–52.0)
HEMOGLOBIN: 12.5 g/dL — AB (ref 13.0–17.0)
MCH: 28.2 pg (ref 26.0–34.0)
MCHC: 32 g/dL (ref 30.0–36.0)
MCV: 88.1 fL (ref 78.0–100.0)
Platelets: 288 10*3/uL (ref 150–400)
RBC: 4.44 MIL/uL (ref 4.22–5.81)
RDW: 12.9 % (ref 11.5–15.5)
WBC: 4.7 10*3/uL (ref 4.0–10.5)

## 2018-07-25 LAB — RAPID URINE DRUG SCREEN, HOSP PERFORMED
Amphetamines: NOT DETECTED
BARBITURATES: NOT DETECTED
BENZODIAZEPINES: NOT DETECTED
Cocaine: NOT DETECTED
OPIATES: NOT DETECTED
Tetrahydrocannabinol: NOT DETECTED

## 2018-07-25 LAB — ETHANOL

## 2018-07-25 LAB — COMPREHENSIVE METABOLIC PANEL
ALT: 44 U/L (ref 0–44)
AST: 25 U/L (ref 15–41)
Albumin: 3.7 g/dL (ref 3.5–5.0)
Alkaline Phosphatase: 47 U/L (ref 38–126)
Anion gap: 9 (ref 5–15)
BUN: 9 mg/dL (ref 6–20)
CHLORIDE: 104 mmol/L (ref 98–111)
CO2: 26 mmol/L (ref 22–32)
CREATININE: 1.14 mg/dL (ref 0.61–1.24)
Calcium: 8.6 mg/dL — ABNORMAL LOW (ref 8.9–10.3)
Glucose, Bld: 104 mg/dL — ABNORMAL HIGH (ref 70–99)
POTASSIUM: 3.7 mmol/L (ref 3.5–5.1)
Sodium: 139 mmol/L (ref 135–145)
TOTAL PROTEIN: 6.5 g/dL (ref 6.5–8.1)
Total Bilirubin: 0.6 mg/dL (ref 0.3–1.2)

## 2018-07-25 MED ORDER — BENZTROPINE MESYLATE 1 MG PO TABS: 0.5000 mg | ORAL_TABLET | Freq: Two times a day (BID) | ORAL | 6 refills | Status: DC

## 2018-07-25 MED ORDER — RISPERIDONE 1 MG PO TABS
1.0000 mg | ORAL_TABLET | Freq: Two times a day (BID) | ORAL | Status: DC
  Administered 2018-07-25: 1 mg via ORAL
  Filled 2018-07-25: qty 1

## 2018-07-25 MED ORDER — RISPERIDONE 1 MG PO TABS: 1.0000 mg | ORAL_TABLET | Freq: Two times a day (BID) | ORAL | 6 refills | Status: DC

## 2018-07-25 MED ORDER — BENZTROPINE MESYLATE 1 MG PO TABS
0.5000 mg | ORAL_TABLET | Freq: Two times a day (BID) | ORAL | Status: DC
  Administered 2018-07-25: 0.5 mg via ORAL
  Filled 2018-07-25: qty 1

## 2018-07-25 NOTE — ED Notes (Signed)
[facility] , states they are aware will do asap

## 2018-07-25 NOTE — ED Notes (Signed)
TTS complete 

## 2018-07-25 NOTE — ED Notes (Addendum)
Pt changed into purple scrubs. Security notifed for wanding. Pt's belongings placed in Pod F locker # 4, not inventoried at this time.

## 2018-07-25 NOTE — ED Notes (Signed)
TTS bedside 

## 2018-07-25 NOTE — BH Assessment (Signed)
Tele Assessment Note   Patient Name: Cole Barrett MRN: [id number] Referring Physician: EDP Location of Patient: [facility] Location of Provider: Behavioral Health TTS Department  Cole Barrett is a 32 y.o. male [facility] with complaint of auditory hallucinations that threaten him.  Pt has presented to the ED on numerous occasions with similar complaint.  Most recently, Pt was assessed in mid-August with complaint of auditory hallucination.   Pt reported as follows:  Auditory hallucinations started about two weeks ago, and since that time, he has noticed an increase in ''voices'' that command him to harm himself.  Pt stated that he wanted to see a doctor so he could adjust medication.  Pt could not remember the name of his medication he takes, and he stated that he got it prescribed during his last visit to the ED.  Pt denied current suicidal ideation, homicidal ideation, self-injurious behavior.  Pt denied any past suicide attempts.  Per history, Pt has a diagnosis of Schizophrenia for which he does not receive outpatient psychiatric services.  Pt stated that [facility], but he does not think he goes now.  Pt asked for help with medication.  During assessment, Pt presented as alert and oriented.  He had good eye contact and was cooperative.  Pt was dressed in scrubs, and he had good eye contact.  Pt's mood was preoccupied.  Affect was euthymic.  Pt endorsed ongoing auditory hallucination-- voices that threaten him.  Pt denied visual hallucination, self-injurious behavior, and homicidal ideation.  Pt denied current use of marijuana, but he has a history of doing so.  Pt's speech was normal in rate, rhythm, and volume.  Pt's thought processes were within normal range, and thought content was logical and goal-oriented.  There was no evidence of delusion.  Pt's memory and concentration were intact.  Impulse control, judgment, and insight were  fair.  Consulted with S. Rankin, NP, who determined that Pt does not meet inpatient criteria.  Pt is to be psych cleared.  Diagnosis: Schizophrenia  Past Medical History:  Past Medical History:  Diagnosis Date  . Homelessness   . Hypercholesterolemia   . Mental disorder   . Paranoid behavior (HCC)   . PE (pulmonary embolism)   . Schizophrenia (HCC)     History reviewed. No pertinent surgical history.  Family History:  Family History  Problem Relation Age of Onset  . Hypertension Mother     Social History:  reports that he has been smoking cigarettes. He has been smoking about 0.50 packs per day. He has never used smokeless tobacco. He reports that he drinks alcohol. He reports that he has current or past drug history. Drug: Marijuana.  Additional Social History:  Alcohol / Drug Use Pain Medications: See MAR Prescriptions: See MAR Over the Counter: See MAR History of alcohol / drug use?: Yes Substance #1 Name of Substance 1: THC 1 - Age of First Use: Teenager 1 - Amount (size/oz): Varied 1 - Frequency: Episodic 1 - Duration: Ongoing 1 - Last Use / Amount: Not sure (UDS was negative)  CIWA: CIWA-Ar BP: 124/72 Pulse Rate: 70 COWS:    Allergies:  Allergies  Allergen Reactions  . Haldol [Haloperidol] Shortness Of Breath    Home Medications:  (Not in a hospital admission)  OB/GYN Status:  No LMP for male patient.  General Assessment Data [facility] Assessment: In system Is this a Tele or Face-to-Face Assessment?:  Tele Assessment Is this an Initial Assessment or a Re-assessment for this encounter?: Initial Assessment Marital status: Single Is patient pregnant?: No Pregnancy Status: No Living Arrangements: Alone Can pt return to current living arrangement?: Yes Admission Status: Voluntary Is patient capable of signing voluntary admission?: Yes Referral Source: Self/Family/Friend Insurance type: None     Crisis Care Plan Living  Arrangements: Alone Legal Guardian: (None indicated) Name of Psychiatrist: None reported Name of Therapist: None reported  Education Status Is patient currently in school?: No Is the patient employed, unemployed or receiving disability?: Employed  Risk to self with the past 6 months Suicidal Ideation: No Has patient been a risk to self within the past 6 months prior to admission? : No Suicidal Intent: No Has patient had any suicidal intent within the past 6 months prior to admission? : No Is patient at risk for suicide?: No Suicidal Plan?: No Has patient had any suicidal plan within the past 6 months prior to admission? : No Access to Means: No What has been your use of drugs/alcohol within the last 12 months?: Marijuana Previous Attempts/Gestures: No How many times?: 0 Other Self Harm Risks: None indicated Triggers for Past Attempts: None known Intentional Self Injurious Behavior: None Family Suicide History: No Recent stressful life event(s): Other (Comment)(Increase in intensity of voices) Persecutory voices/beliefs?: Yes(Voices say they will kill hium) Depression: No Substance abuse history and/or treatment for substance abuse?: Yes Suicide prevention information given to non-admitted patients: Not applicable  Risk to Others within the past 6 months Homicidal Ideation: No Does patient have any lifetime risk of violence toward others beyond the six months prior to admission? : No Thoughts of Harm to Others: No Current Homicidal Intent: No Current Homicidal Plan: No Access to Homicidal Means: No History of harm to others?: No Assessment of Violence: None Noted Does patient have access to weapons?: No Criminal Charges Pending?: No Does patient have a court date: No Is patient on probation?: No  Psychosis Hallucinations: Auditory, With command Delusions: None noted  Mental Status Report Appearance/Hygiene: In scrubs, Unremarkable Eye Contact: Good Motor Activity:  Unremarkable, Freedom of movement Speech: Logical/coherent, Soft Level of Consciousness: Alert Mood: Preoccupied Affect: Appropriate to circumstance, Blunted Anxiety Level: None Thought Processes: Relevant, Coherent Judgement: Partial Orientation: Person, Place, Time, Situation Obsessive Compulsive Thoughts/Behaviors: None  Cognitive Functioning Concentration: Normal Memory: Recent Intact, Remote Intact Is patient IDD: No Is patient DD?: No Insight: Fair Impulse Control: Fair Appetite: Good Have you had any weight changes? : No Change Sleep: No Change Total Hours of Sleep: 7 Vegetative Symptoms: None  ADLScreening [facility] Assessment Services) Patient's cognitive ability adequate to safely complete daily activities?: Yes Patient able to express need for assistance with ADLs?: Yes Independently performs ADLs?: Yes (appropriate for developmental age)  Prior Inpatient Therapy Prior Inpatient Therapy: Yes Prior Therapy Dates: Multiple, though pt cannot recall when Prior Therapy Facilty/Provider(s): Unknown Reason for Treatment: Schizophrenia  Prior Outpatient Therapy Prior Outpatient Therapy: Yes Prior Therapy Dates: Unknown Prior Therapy Facilty/Provider(s): [facility] Reason for Treatment: Schozphrenia Does patient have an ACCT team?: No Does patient have Intensive In-House Services?  : No Does patient have [facility] services? : Unknown(May not currently use [employer]) Does patient have P4CC services?: No  ADL Screening (condition at time of admission) Patient's cognitive ability adequate to safely complete daily activities?: Yes Is the patient deaf or have difficulty hearing?: No Does the patient have difficulty seeing, even when wearing glasses/contacts?: No Does the patient have difficulty concentrating, remembering, or making  decisions?: No Patient able to express need for assistance with ADLs?: Yes Does the patient have difficulty dressing or bathing?:  No Independently performs ADLs?: Yes (appropriate for developmental age) Does the patient have difficulty walking or climbing stairs?: No Weakness of Legs: None Weakness of Arms/Hands: None  Home Assistive Devices/Equipment Home Assistive Devices/Equipment: None  Therapy Consults (therapy consults require a physician order) PT Evaluation Needed: No OT Evalulation Needed: No SLP Evaluation Needed: No Abuse/Neglect Assessment (Assessment to be complete while patient is alone) Abuse/Neglect Assessment Can Be Completed: Yes Physical Abuse: Denies Verbal Abuse: Denies Sexual Abuse: Denies Exploitation of patient/patient's resources: Denies Self-Neglect: Denies Values / Beliefs Cultural Requests During Hospitalization: None Spiritual Requests During Hospitalization: None Consults Spiritual Care Consult Needed: No Social Work Consult Needed: No Merchant navy officer (For Healthcare) Does Patient Have a Medical Advance Directive?: No Would patient like information on creating a medical advance directive?: No - Patient declined    Additional Information 1:1 In Past 12 Months?: No CIRT Risk: No Elopement Risk: No Does patient have medical clearance?: Yes     Disposition:  Disposition Initial Assessment Completed for this Encounter: Yes Disposition of Patient: Discharge(Per S. Rankin,, NP, Pt to be psych cleared) Patient referred to: Outpatient clinic referral(Referred to [facility])  This service was provided via telemedicine using a 2-way, interactive audio and video technology.  Names of all persons participating in this telemedicine service and their role in this encounter. Name: Acxel, Dingee Role: Pt  Name: Ephriam Knuckles Role: NP          Dorris Fetch Gweneth Fredlund 07/25/2018 12:00 PM

## 2018-07-25 NOTE — Consult Note (Signed)
  Tele psych Assessment   Cole Barrett, 32 y.o., male patient seen via telepsych by TTS and this provider; chart reviewed and consulted with Dr. Lucianne MussKumar on 07/25/18.  On evaluation Cole Barrett reports that he is having worsening of auditory hallucinations.  Patient states that he has not [facility] since his last discharged from ED.  Patient has multiple visits to the ED with similar complaints.  Patient is restarted on medication but does not follow up with outpatient psychiatric provider.  Patient denies suicidal/self-harm/homicidal ideation and paranoia.  Patient states when he has his medication that the voices go away.   During evaluation Cole Barrett is alert/oriented x 4; calm/cooperative; and mood is congruent with affect.  Patient does not appear to be responding to internal/external stimuli or delusional thoughts; but continues to endorse non command auditory hallucinations.  Patient denies suicidal/self-harm/homicidal ideation, and paranoia.  Patient answered question appropriately.  Patient informed that he needs to follow up with outpatient psychiatric provider that the ED is not the place to come for medication refills.      For detailed note see TTS tele assessment note  Recommendations:  Restart patient medications Cogentin 0.5 mg bid; Risperdal 1 mg Bid.  Patient will also need a prescription for medication when discharged and Encouraged to [facility].    Disposition:  Patient psychiatrically cleared.  No evidence of imminent risk to self or others at present.   Patient does not meet criteria for psychiatric inpatient admission. Supportive therapy provided about ongoing stressors. Discussed crisis plan, support from social network, calling 911, coming to the Emergency Department, and calling Suicide Hotline.   Spoke with Dr. Patria Maneampos; informed of above recommendation and disposition  Shuvon B. Rankin, NP

## 2018-07-25 NOTE — ED Triage Notes (Signed)
Pt reports he is hearing voices again and that they are going to kill him, pt reports he is compliant with medications. Pt denies SI/HI. Pt denies pain. Pt denies etoh or drug use. Hx of schizophrenia.

## 2018-07-25 NOTE — ED Notes (Signed)
Patient denies SI/HI states he is just hearing voices patient is alert oriented

## 2018-07-25 NOTE — ED Notes (Signed)
Pt ready for discharge VS documented 

## 2018-07-25 NOTE — ED Provider Notes (Signed)
[name] Provider Note   CSN: [id number] Arrival date & time: 07/25/18  0132     History   Chief Complaint Chief Complaint  Patient presents with  . Psychiatric Evaluation    HPI Cole Barrett is a 32 y.o. male.  Patient is a 32 year old male with past medical history of pulmonary embolism, schizoaffective disorder.  He presents today for evaluation of auditory hallucinations.  He tells me that he has hearing voices telling him to harm himself.  This is been ongoing for some time but is worsening.  He denies any fevers or chills.  He is requesting to go to behavioral health.  The history is provided by the patient.    Past Medical History:  Diagnosis Date  . Homelessness   . Hypercholesterolemia   . Mental disorder   . Paranoid behavior (HCC)   . PE (pulmonary embolism)   . Schizophrenia [facility](HCC)     Patient Active Problem List   Diagnosis Date Noted  . 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 Alcohol abuse   . Auditory hallucinations   . Alcohol abuse with alcohol-induced mood disorder (HCC) 09/08/2016  . Homelessness 09/03/2016  . Person feigning illness 09/03/2016  . 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 Cannabis use disorder, mild, abuse 08/29/2016  . Pulmonary emboli (HCC) 07/13/2016  . 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 Acute left flank pain 01/02/2014  . Pulmonary embolism, bilateral (HCC) 01/02/2014    History reviewed. No pertinent surgical history.      Home Medications    Prior to Admission medications   Medication Sig Start Date End Date Taking? Authorizing Provider  risperiDONE (RISPERDAL) 3 MG tablet Take 3 mg by mouth at bedtime.   Yes [provider]  paliperidone (INVEGA) 6 MG 24 hr tablet Take 1 tablet (6 mg total) by mouth daily. Patient not taking: Reported on 07/10/2018 06/09/18   Jeanie SewerFawze, Mina A, PA-C     Family History Family History  Problem Relation Age of Onset  . Hypertension Mother     Social History Social History   Tobacco Use  . Smoking status: Current Every Day Smoker    Packs/day: 0.50    Types: Cigarettes  . Smokeless tobacco: Never Used  Substance Use Topics  . Alcohol use: Yes    Comment: Last drink: yesterday   . Drug use: Yes    Types: Marijuana    Comment: Last used: 2 weeks ago     Allergies   Haldol [haloperidol]   Review of Systems Review of Systems  All other systems reviewed and are negative.    Physical Exam Updated Vital Signs BP 124/72 (BP Location: Left Arm)   Pulse 70   Temp 98.6 F (37 C) (Oral)   Resp 18   Ht 5\' 10"  (1.778 m)   Wt 99.8 kg   SpO2 100%   BMI 31.57 kg/m   Physical Exam  Constitutional: He is oriented to person, place, and time. He appears well-developed and well-nourished. No distress.  HENT:  Head: Normocephalic and atraumatic.  Mouth/Throat: Oropharynx is clear and moist.  Neck: Normal range of motion. Neck supple.  Cardiovascular: Normal rate and regular rhythm. Exam reveals no friction rub.  No murmur heard. Pulmonary/Chest: Effort normal and breath sounds normal. No respiratory distress. He has no wheezes. He has no rales.  Abdominal: Soft. Bowel sounds are normal. He exhibits no distension. There is no tenderness.  Musculoskeletal: Normal range of motion. He exhibits no edema.  Neurological: He is alert and oriented to person, place, and time. Coordination normal.  Skin: Skin is warm and dry. He is not diaphoretic.  Psychiatric: His behavior is normal. Thought content normal. His affect is not angry. His speech is rapid and/or pressured. He is actively hallucinating. Cognition and memory are normal.  Nursing note and vitals reviewed.    ED Treatments / Results  Labs (all labs ordered are listed, but only abnormal results are displayed) Labs Reviewed  COMPREHENSIVE METABOLIC PANEL - Abnormal;  Notable for the following components:      Result Value   Glucose, Bld 104 (*)    Calcium 8.6 (*)    All other components within normal limits  CBC - Abnormal; Notable for the following components:   Hemoglobin 12.5 (*)    All other components within normal limits  ETHANOL  RAPID URINE DRUG SCREEN, HOSP PERFORMED    EKG None  Radiology No results found.  Procedures Procedures (including critical care time)  Medications Ordered in ED Medications - No data to display   Initial Impression / Assessment and Plan / ED Course  I have reviewed the triage vital signs and the nursing notes.  Pertinent labs & imaging results that were available during my care of the patient were reviewed by me and considered in my medical decision making (see chart for details).  Patient is medically cleared and will be evaluated by TTS who will determine the final disposition.  Final Clinical Impressions(s) / ED Diagnoses   Final diagnoses:  None    ED Discharge Orders    None       Geoffery Lyons, MD 07/26/18 [phone]

## 2018-08-07 ENCOUNTER — Encounter (HOSPITAL_COMMUNITY): Payer: Self-pay | Admitting: Emergency Medicine

## 2018-08-07 ENCOUNTER — Emergency Department (HOSPITAL_COMMUNITY)
Admission: EM | Admit: 2018-08-07 | Discharge: 2018-08-08 | Disposition: A | Discharge status: Home / Self Care | Payer: Self-pay | Attending: Emergency Medicine | Admitting: Emergency Medicine

## 2018-08-07 DIAGNOSIS — F209 Schizophrenia, unspecified: Secondary | ICD-10-CM | POA: Insufficient documentation

## 2018-08-07 DIAGNOSIS — F17210 Nicotine dependence, cigarettes, uncomplicated: Secondary | ICD-10-CM | POA: Insufficient documentation

## 2018-08-07 DIAGNOSIS — Z79899 Other long term (current) drug therapy: Secondary | ICD-10-CM | POA: Insufficient documentation

## 2018-08-07 DIAGNOSIS — F1210 Cannabis abuse, uncomplicated: Secondary | ICD-10-CM | POA: Insufficient documentation

## 2018-08-07 DIAGNOSIS — I10 Essential (primary) hypertension: Secondary | ICD-10-CM | POA: Insufficient documentation

## 2018-08-07 NOTE — ED Triage Notes (Signed)
Pt reports the "medicine isnt working" Pt states he wants to be put into [facility]. Denies SI/HI.

## 2018-08-08 NOTE — ED Notes (Signed)
Pt called for vitals x4 no reply. 

## 2018-08-08 NOTE — ED Provider Notes (Signed)
[name] EMERGENCY DEPARTMENT Provider Note   CSN: [id number] Arrival date & time: 08/07/18  2232     History   Chief Complaint Chief Complaint  Patient presents with  . Medication Management    HPI Darrel Dorton is a 32 y.o. male.  The history is provided by the patient and medical records.     32 year old male with history of homelessness, paranoid behavior, schizophrenia, chronic hallucinations, presenting to the ED requesting admission to behavioral health.  Patient reports to me "things got bad today, so I came straight here".  He is very vague in his complaints and keeps repeating this statement over and over again, requesting to be admitted ot behavioral health.  He has not been taking his medications, has not attempted to follow-up as an outpatient.  He denies any suicidal or homicidal ideation.  Past Medical History:  Diagnosis Date  . Homelessness   . Hypercholesterolemia   . Mental disorder   . Paranoid behavior (HCC)   . PE (pulmonary embolism)   . Schizophrenia [facility])     Patient Active Problem List   Diagnosis Date Noted  . 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 Alcohol abuse   . Auditory hallucinations   . Alcohol abuse with alcohol-induced mood disorder (HCC) 09/08/2016  . Homelessness 09/03/2016  . Person feigning illness 09/03/2016  . 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 Cannabis use disorder, mild, abuse 08/29/2016  . Pulmonary emboli (HCC) 07/13/2016  . 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 Acute left flank pain 01/02/2014  . Pulmonary embolism, bilateral (HCC) 01/02/2014    History reviewed. No pertinent surgical history.      Home Medications    Prior to Admission medications   Medication Sig Start Date End Date Taking? Authorizing Provider  benztropine (COGENTIN) 1 MG tablet Take 0.5 tablets (0.5 mg total) by mouth 2 (two) times  daily. 07/25/18   Azalia Bilis, MD  paliperidone (INVEGA) 6 MG 24 hr tablet Take 1 tablet (6 mg total) by mouth daily. Patient not taking: Reported on 07/10/2018 06/09/18   Michela Pitcher A, PA-C  risperiDONE (RISPERDAL) 1 MG tablet Take 1 tablet (1 mg total) by mouth 2 (two) times daily. 07/25/18   Azalia Bilis, MD    Family History Family History  Problem Relation Age of Onset  . Hypertension Mother     Social History Social History   Tobacco Use  . Smoking status: Current Every Day Smoker    Packs/day: 0.50    Types: Cigarettes  . Smokeless tobacco: Never Used  Substance Use Topics  . Alcohol use: Yes  . Drug use: Yes    Types: Marijuana    Comment: Last used: 2 weeks ago     Allergies   Haldol [haloperidol]   Review of Systems Review of Systems  Psychiatric/Behavioral:       Medical clearance  All other systems reviewed and are negative.    Physical Exam Updated Vital Signs BP 121/84 (BP Location: Left Arm)   Pulse 65   Temp 97.6 F (36.4 C) (Oral)   Resp 14   SpO2 100%   Physical Exam  Constitutional: He is oriented to person, place, and time. He appears well-developed.  Sleeping, had to be awoken for exam  HENT:  Head: Normocephalic and atraumatic.  Mouth/Throat: Oropharynx is clear and moist.  Eyes: Pupils are equal, round, and reactive to light. Conjunctivae and EOM are normal.  Neck:  Normal range of motion.  Cardiovascular: Normal rate, regular rhythm and normal heart sounds.  Pulmonary/Chest: Effort normal and breath sounds normal.  Abdominal: Soft. Bowel sounds are normal.  Musculoskeletal: Normal range of motion.  Neurological: He is alert and oriented to person, place, and time.  Skin: Skin is warm and dry.  Psychiatric:  Repeatedly asking for admission to behavioral health Does not appear to be actively hallucinating, is not responding to internal stimuli Denies SI/HI  Nursing note and vitals reviewed.    ED Treatments / Results   Labs (all labs ordered are listed, but only abnormal results are displayed) Labs Reviewed - No data to display  EKG None  Radiology No results found.  Procedures Procedures (including critical care time)  Medications Ordered in ED Medications - No data to display   Initial Impression / Assessment and Plan / ED Course  I have reviewed the triage vital signs and the nursing notes.  Pertinent labs & imaging results that were available during my care of the patient were reviewed by me and considered in my medical decision making (see chart for details).  3:37 AM] Patient seen and evaluated here.  He is vague in his complaints, not endorsing any active hallucinations to me, does not appear to be responding to internal stimuli.  He appears at his baseline.  Screening labs reassuring.  Patient has been seen and evaluated for this numerous times in the past, is notoriously non-compliant and has not yet attempted to follow-up as an outpatient.  At this point, do not feel he is a danger to himself or others at this time.  I discussed with him that he needs to follow-up as an outpatient.  Was once again given OP resources.  He can return here for any new/acute changes.  Final Clinical Impressions(s) / ED Diagnoses   Final diagnoses:  Schizophrenia, unspecified type [facility])    ED Discharge Orders    None       Garlon Hatchet, PA-C 08/08/18 0354    Geoffery Lyons, MD 08/08/18 [phone]

## 2018-08-08 NOTE — ED Notes (Signed)
Pt found sleeping in triage waiting.

## 2018-08-08 NOTE — Discharge Instructions (Addendum)
You need to follow-up with behavioral health team as an outpatient.  See resource guide.   Take your medications. Return to the ED for new or worsening symptoms.

## 2018-08-26 ENCOUNTER — Encounter (HOSPITAL_COMMUNITY): Payer: Self-pay

## 2018-08-26 ENCOUNTER — Emergency Department (HOSPITAL_COMMUNITY)
Admission: EM | Admit: 2018-08-26 | Discharge: 2018-08-27 | Disposition: A | Discharge status: Home / Self Care | Payer: Self-pay | Attending: Emergency Medicine | Admitting: Emergency Medicine

## 2018-08-26 ENCOUNTER — Other Ambulatory Visit: Payer: Self-pay

## 2018-08-26 DIAGNOSIS — R440 Auditory hallucinations: Secondary | ICD-10-CM | POA: Insufficient documentation

## 2018-08-26 DIAGNOSIS — Z8659 Personal history of other mental and behavioral disorders: Secondary | ICD-10-CM | POA: Insufficient documentation

## 2018-08-26 DIAGNOSIS — Z9114 Patient's other noncompliance with medication regimen: Secondary | ICD-10-CM | POA: Insufficient documentation

## 2018-08-26 DIAGNOSIS — Z79899 Other long term (current) drug therapy: Secondary | ICD-10-CM | POA: Insufficient documentation

## 2018-08-26 DIAGNOSIS — F17210 Nicotine dependence, cigarettes, uncomplicated: Secondary | ICD-10-CM | POA: Insufficient documentation

## 2018-08-26 NOTE — ED Triage Notes (Signed)
Pt here stating he wants to get into [facility]. Denies HI/SI. States he is not taking any meds on a daily basis. A&Ox4. Said he hearing voices.

## 2018-08-27 NOTE — ED Notes (Signed)
Patient verbalizes understanding of discharge instructions. Opportunity for questioning and answers were provided. Armband removed by staff, pt discharged from ED ambulatory.   

## 2018-08-27 NOTE — ED Provider Notes (Signed)
[name] EMERGENCY DEPARTMENT Provider Note   CSN: [id number] Arrival date & time: 08/26/18  2235     History   Chief Complaint Chief Complaint  Patient presents with  . Medical Clearance    HPI Cole Barrett is a 32 y.o. male with a history of homelessness, paranoid behavior, schizophrenia, chronic hallucinations who presents emergency department today stating he has hallucinations.  Patient reports that he started hearing voices so he came straight here.  He reports he hears voices every day. He reports he has not been taking his medications.  He has not tried to follow-up outpatient.  He denies any SI/HI. No psychical complaints at this time.   HPI  Past Medical History:  Diagnosis Date  . Homelessness   . Hypercholesterolemia   . Mental disorder   . Paranoid behavior (HCC)   . PE (pulmonary embolism)   . Schizophrenia [facility])     Patient Active Problem List   Diagnosis Date Noted  . 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 Alcohol abuse   . Auditory hallucinations   . Alcohol abuse with alcohol-induced mood disorder (HCC) 09/08/2016  . Homelessness 09/03/2016  . Person feigning illness 09/03/2016  . 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 Cannabis use disorder, mild, abuse 08/29/2016  . Pulmonary emboli (HCC) 07/13/2016  . 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 Acute left flank pain 01/02/2014  . Pulmonary embolism, bilateral (HCC) 01/02/2014    History reviewed. No pertinent surgical history.      Home Medications    Prior to Admission medications   Medication Sig Start Date End Date Taking? Authorizing Provider  benztropine (COGENTIN) 1 MG tablet Take 0.5 tablets (0.5 mg total) by mouth 2 (two) times daily. 07/25/18   Azalia Bilis, MD  paliperidone (INVEGA) 6 MG 24 hr tablet Take 1 tablet (6 mg total) by mouth daily. Patient not taking: Reported on 07/10/2018  06/09/18   Michela Pitcher A, PA-C  risperiDONE (RISPERDAL) 1 MG tablet Take 1 tablet (1 mg total) by mouth 2 (two) times daily. 07/25/18   Azalia Bilis, MD    Family History Family History  Problem Relation Age of Onset  . Hypertension Mother     Social History Social History   Tobacco Use  . Smoking status: Current Every Day Smoker    Packs/day: 0.50    Types: Cigarettes  . Smokeless tobacco: Never Used  Substance Use Topics  . Alcohol use: Yes  . Drug use: Yes    Types: Marijuana    Comment: Last used: 2 weeks ago     Allergies   Haldol [haloperidol]   Review of Systems Review of Systems  All other systems reviewed and are negative.    Physical Exam Updated Vital Signs BP 116/76 (BP Location: Right Arm)   Pulse (!) 114   Temp 98.7 F (37.1 C) (Oral)   Resp 14   Ht 5\' 10"  (1.778 m)   Wt 102.1 kg   SpO2 96%   BMI 32.28 kg/m   Physical Exam  Constitutional: He appears well-developed and well-nourished.  HENT:  Head: Normocephalic and atraumatic.  Right Ear: External ear normal.  Left Ear: External ear normal.  Nose: Nose normal.  Mouth/Throat: Uvula is midline, oropharynx is clear and moist and mucous membranes are normal. No tonsillar exudate.  Eyes: Pupils are equal, round, and reactive to light. Right eye exhibits no discharge. Left eye exhibits no discharge. No scleral  icterus.  Neck: Trachea normal. Neck supple. No spinous process tenderness present. No neck rigidity. Normal range of motion present.  Cardiovascular: Normal rate, regular rhythm and intact distal pulses.  No murmur heard. Pulses:      Radial pulses are 2+ on the right side, and 2+ on the left side.       Dorsalis pedis pulses are 2+ on the right side, and 2+ on the left side.       Posterior tibial pulses are 2+ on the right side, and 2+ on the left side.  No lower extremity swelling or edema. Calves symmetric in size bilaterally.  Pulmonary/Chest: Effort normal and breath sounds  normal. He exhibits no tenderness.  Abdominal: Soft. Bowel sounds are normal. There is no tenderness. There is no rigidity, no rebound and no guarding.  Musculoskeletal: He exhibits no edema.  Lymphadenopathy:    He has no cervical adenopathy.  Neurological: He is alert.  Skin: Skin is warm and dry. No rash noted. He is not diaphoretic.  Psychiatric: He has a normal mood and affect.  Nursing note and vitals reviewed.    ED Treatments / Results  Labs (all labs ordered are listed, but only abnormal results are displayed) Labs Reviewed - No data to display  EKG None  Radiology No results found.  Procedures Procedures (including critical care time)  Medications Ordered in ED Medications - No data to display   Initial Impression / Assessment and Plan / ED Course  I have reviewed the triage vital signs and the nursing notes.  Pertinent labs & imaging results that were available during my care of the patient were reviewed by me and considered in my medical decision making (see chart for details).     32 y.o. male with schizophrenia, chronic hallucinations presents the emergent department today for hearing voices. He does not appear to be responding to internal stimuli.  He appears to be at his baseline per chart review.  Patient has been seen here and evaluated several times in the past.  He denies any SI/HI.  Denies any physical complaints.  No further work-up indicated at this time.  No indication for TTS consult.  I discussed with him he needs a follow-up as an outpatient.  He can return at any time for any new/worsening changes.  Return precautions discussed.  Final Clinical Impressions(s) / ED Diagnoses   Final diagnoses:  History of schizophrenia    ED Discharge Orders    None       Princella Pellegrini 08/27/18 0014    Jacalyn Lefevre, MD 08/30/18 1505

## 2018-08-27 NOTE — Discharge Instructions (Addendum)
Please use resources to follow up with behavorial health as an outpatient.  Please take your prescribed medications as prescribed.  Return if you develop thoughts of harming yourself, others, or see things that are not there. If you develop worsening or new concerning symptoms you can return to the emergency department for re-evaluation.

## 2018-09-08 ENCOUNTER — Encounter (HOSPITAL_COMMUNITY): Payer: Self-pay | Admitting: Emergency Medicine

## 2018-09-08 ENCOUNTER — Emergency Department (HOSPITAL_COMMUNITY)
Admission: EM | Admit: 2018-09-08 | Discharge: 2018-09-09 | Disposition: A | Discharge status: Home / Self Care | Payer: Self-pay | Attending: Emergency Medicine | Admitting: Emergency Medicine

## 2018-09-08 DIAGNOSIS — Z79899 Other long term (current) drug therapy: Secondary | ICD-10-CM | POA: Insufficient documentation

## 2018-09-08 DIAGNOSIS — F17210 Nicotine dependence, cigarettes, uncomplicated: Secondary | ICD-10-CM | POA: Insufficient documentation

## 2018-09-08 DIAGNOSIS — R1084 Generalized abdominal pain: Secondary | ICD-10-CM | POA: Insufficient documentation

## 2018-09-08 LAB — COMPREHENSIVE METABOLIC PANEL
ALBUMIN: 3.8 g/dL (ref 3.5–5.0)
ALT: 36 U/L (ref 0–44)
ANION GAP: 11 (ref 5–15)
AST: 22 U/L (ref 15–41)
Alkaline Phosphatase: 41 U/L (ref 38–126)
BILIRUBIN TOTAL: 0.5 mg/dL (ref 0.3–1.2)
BUN: 10 mg/dL (ref 6–20)
CO2: 23 mmol/L (ref 22–32)
Calcium: 8.7 mg/dL — ABNORMAL LOW (ref 8.9–10.3)
Chloride: 105 mmol/L (ref 98–111)
Creatinine, Ser: 0.98 mg/dL (ref 0.61–1.24)
GFR calc non Af Amer: >60 mL/min (ref >60)
GLUCOSE: 133 mg/dL — AB (ref 70–99)
POTASSIUM: 3.3 mmol/L — AB (ref 3.5–5.1)
SODIUM: 139 mmol/L (ref 135–145)
TOTAL PROTEIN: 7.1 g/dL (ref 6.5–8.1)

## 2018-09-08 LAB — URINALYSIS, ROUTINE W REFLEX MICROSCOPIC
Bilirubin Urine: NEGATIVE
Glucose, UA: NEGATIVE mg/dL
Hgb urine dipstick: NEGATIVE
Ketones, ur: NEGATIVE mg/dL
LEUKOCYTES UA: NEGATIVE
NITRITE: NEGATIVE
PROTEIN: NEGATIVE mg/dL
SPECIFIC GRAVITY, URINE: 1.009 (ref 1.005–1.030)
pH: 6 (ref 5.0–8.0)

## 2018-09-08 LAB — CBC
HCT: 40.4 % (ref 39.0–52.0)
HEMOGLOBIN: 13 g/dL (ref 13.0–17.0)
MCH: 28.1 pg (ref 26.0–34.0)
MCHC: 32.2 g/dL (ref 30.0–36.0)
MCV: 87.3 fL (ref 80.0–100.0)
NRBC: 0 % (ref 0.0–0.2)
Platelets: 344 10*3/uL (ref 150–400)
RBC: 4.63 MIL/uL (ref 4.22–5.81)
RDW: 12.8 % (ref 11.5–15.5)
WBC: 4.8 10*3/uL (ref 4.0–10.5)

## 2018-09-08 LAB — LIPASE, BLOOD: Lipase: 40 U/L (ref 11–51)

## 2018-09-08 NOTE — ED Triage Notes (Signed)
Pt presents with 9/10 abd pain, no associated factors, mental health history, came with luggage

## 2018-09-09 ENCOUNTER — Emergency Department (HOSPITAL_COMMUNITY): Payer: Self-pay

## 2018-09-09 NOTE — ED Notes (Signed)
Pt back in bed.

## 2018-09-09 NOTE — ED Provider Notes (Signed)
[name] EMERGENCY DEPARTMENT Provider Note   CSN: [id number] Arrival date & time: 09/08/18  2235     History   Chief Complaint Chief Complaint  Patient presents with  . Abdominal Pain    HPI Cole Barrett is a 32 y.o. male.  HPI  This is a 32 year old male with a history of homelessness, schizophrenia, PE who presents with abdominal pain.  Patient reports onset of abdominal pain at 4 PM.  He reports that is generalized and radiates upwards.  Currently he rates his pain 8 out of 10.  He has not taken anything for pain.  Denies any nausea, vomiting, constipation, diarrhea.  Denies chest pain, shortness of breath, fevers.  Patient is a difficult historian and is unable to further elaborate.  Past Medical History:  Diagnosis Date  . Homelessness   . Hypercholesterolemia   . Mental disorder   . Paranoid behavior (HCC)   . PE (pulmonary embolism)   . Schizophrenia [facility])     Patient Active Problem List   Diagnosis Date Noted  . 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 Alcohol abuse   . Auditory hallucinations   . Alcohol abuse with alcohol-induced mood disorder (HCC) 09/08/2016  . Homelessness 09/03/2016  . Person feigning illness 09/03/2016  . 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 Cannabis use disorder, mild, abuse 08/29/2016  . Pulmonary emboli (HCC) 07/13/2016  . 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 Acute left flank pain 01/02/2014  . Pulmonary embolism, bilateral (HCC) 01/02/2014    History reviewed. No pertinent surgical history.      Home Medications    Prior to Admission medications   Medication Sig Start Date End Date Taking? Authorizing Provider  benztropine (COGENTIN) 1 MG tablet Take 0.5 tablets (0.5 mg total) by mouth 2 (two) times daily. 07/25/18   Azalia Bilis, MD  paliperidone (INVEGA) 6 MG 24 hr tablet Take 1 tablet (6 mg total) by mouth  daily. Patient not taking: Reported on 07/10/2018 06/09/18   Michela Pitcher A, PA-C  risperiDONE (RISPERDAL) 1 MG tablet Take 1 tablet (1 mg total) by mouth 2 (two) times daily. 07/25/18   Azalia Bilis, MD    Family History Family History  Problem Relation Age of Onset  . Hypertension Mother     Social History Social History   Tobacco Use  . Smoking status: Current Every Day Smoker    Packs/day: 0.50    Types: Cigarettes  . Smokeless tobacco: Never Used  Substance Use Topics  . Alcohol use: Yes  . Drug use: Yes    Types: Marijuana    Comment: Last used: 2 weeks ago     Allergies   Haldol [haloperidol]   Review of Systems Review of Systems  Constitutional: Negative for fever.  Respiratory: Negative for shortness of breath.   Cardiovascular: Negative for chest pain.  Gastrointestinal: Positive for abdominal pain. Negative for constipation, diarrhea, nausea and vomiting.  Genitourinary: Negative for dysuria and hematuria.  All other systems reviewed and are negative.    Physical Exam Updated Vital Signs BP (!) 103/58   Pulse 65   Temp 98.6 F (37 C) (Oral)   Resp 16   SpO2 98%   Physical Exam  Constitutional: He is oriented to person, place, and time. He appears well-developed and well-nourished.  Disheveled but nontoxic-appearing  HENT:  Head: Normocephalic and atraumatic.  Neck: Neck supple.  Cardiovascular: Normal rate, regular rhythm and normal heart  sounds.  No murmur heard. Pulmonary/Chest: Effort normal and breath sounds normal. No respiratory distress. He has no wheezes.  Abdominal: Soft. Bowel sounds are normal. There is generalized tenderness. There is no rebound and no guarding.  Normal bowel sounds, mild generalized tenderness palpation, no rebound or guarding  Musculoskeletal: He exhibits no edema.  Lymphadenopathy:    He has no cervical adenopathy.  Neurological: He is alert and oriented to person, place, and time.  Skin: Skin is warm and dry.   Psychiatric: He has a normal mood and affect.  Nursing note and vitals reviewed.    ED Treatments / Results  Labs (all labs ordered are listed, but only abnormal results are displayed) Labs Reviewed  COMPREHENSIVE METABOLIC PANEL - Abnormal; Notable for the following components:      Result Value   Potassium 3.3 (*)    Glucose, Bld 133 (*)    Calcium 8.7 (*)    All other components within normal limits  URINALYSIS, ROUTINE W REFLEX MICROSCOPIC - Abnormal; Notable for the following components:   Color, Urine STRAW (*)    All other components within normal limits  LIPASE, BLOOD  CBC    EKG None  Radiology Dg Abdomen Acute W/chest  Result Date: 09/09/2018 CLINICAL DATA:  Abdominal pain. EXAM: DG ABDOMEN ACUTE W/ 1V CHEST COMPARISON:  Chest 08/31/2016 FINDINGS: Normal heart size and pulmonary vascularity. No focal airspace disease or consolidation in the lungs. No blunting of costophrenic angles. No pneumothorax. Mediastinal contours appear intact. Gas and stool throughout the colon. No small or large bowel distention. No free intra-abdominal air. No abnormal air-fluid levels. No radiopaque stones. Visualized bones appear intact. IMPRESSION: No evidence of active pulmonary disease. Nonobstructive bowel gas pattern with stool-filled colon. Electronically Signed   By: Burman Nieves M.D.   On: 09/09/2018 04:24    Procedures Procedures (including critical care time)  Medications Ordered in ED Medications - No data to display   Initial Impression / Assessment and Plan / ED Course  I have reviewed the triage vital signs and the nursing notes.  Pertinent labs & imaging results that were available during my care of the patient were reviewed by me and considered in my medical decision making (see chart for details).     Patient presents with abdominal pain.  He is overall nontoxic-appearing and vital signs are reassuring.  He is a generally poor historian.  He may be has some  mild objective tenderness on exam but no rebound or guarding.  No signs of peritonitis.  Denies any vomiting or diarrhea or constipation.  Does not sound obstructive in nature.  Lab work obtained.  Lab work is largely unremarkable.  Acute abdominal series without air-fluid levels.  Doubt appendicitis, cholecystitis at this time.  Gastritis is a possibility.  Patient has been sleeping during his entire ED stay without requiring any pain medication.  He was able to tolerate fluids without difficulty.  After history, exam, and medical workup I feel the patient has been appropriately medically screened and is safe for discharge home. Pertinent diagnoses were discussed with the patient. Patient was given return precautions.  Final Clinical Impressions(s) / ED Diagnoses   Final diagnoses:  Generalized abdominal pain    ED Discharge Orders    None       Shon Baton, MD 09/09/18 [phone]

## 2018-09-30 ENCOUNTER — Encounter (HOSPITAL_COMMUNITY): Payer: Self-pay | Admitting: Emergency Medicine

## 2018-09-30 ENCOUNTER — Emergency Department (HOSPITAL_COMMUNITY)
Admission: EM | Admit: 2018-09-30 | Discharge: 2018-09-30 | Disposition: A | Discharge status: Home / Self Care | Payer: Self-pay | Attending: Emergency Medicine | Admitting: Emergency Medicine

## 2018-09-30 ENCOUNTER — Other Ambulatory Visit: Payer: Self-pay

## 2018-09-30 DIAGNOSIS — Z79899 Other long term (current) drug therapy: Secondary | ICD-10-CM | POA: Insufficient documentation

## 2018-09-30 DIAGNOSIS — F209 Schizophrenia, unspecified: Secondary | ICD-10-CM | POA: Insufficient documentation

## 2018-09-30 DIAGNOSIS — R440 Auditory hallucinations: Secondary | ICD-10-CM | POA: Insufficient documentation

## 2018-09-30 DIAGNOSIS — F17210 Nicotine dependence, cigarettes, uncomplicated: Secondary | ICD-10-CM | POA: Insufficient documentation

## 2018-09-30 DIAGNOSIS — Z046 Encounter for general psychiatric examination, requested by authority: Secondary | ICD-10-CM | POA: Insufficient documentation

## 2018-09-30 DIAGNOSIS — F1210 Cannabis abuse, uncomplicated: Secondary | ICD-10-CM | POA: Insufficient documentation

## 2018-09-30 LAB — ACETAMINOPHEN LEVEL

## 2018-09-30 LAB — COMPREHENSIVE METABOLIC PANEL
ALT: 37 U/L (ref 0–44)
ANION GAP: 11 (ref 5–15)
AST: 34 U/L (ref 15–41)
Albumin: 4 g/dL (ref 3.5–5.0)
Alkaline Phosphatase: 41 U/L (ref 38–126)
BUN: 6 mg/dL (ref 6–20)
CO2: 27 mmol/L (ref 22–32)
Calcium: 9.1 mg/dL (ref 8.9–10.3)
Chloride: 101 mmol/L (ref 98–111)
Creatinine, Ser: 1.07 mg/dL (ref 0.61–1.24)
Glucose, Bld: 114 mg/dL — ABNORMAL HIGH (ref 70–99)
Potassium: 3.6 mmol/L (ref 3.5–5.1)
Sodium: 139 mmol/L (ref 135–145)
TOTAL PROTEIN: 7.2 g/dL (ref 6.5–8.1)
Total Bilirubin: 0.6 mg/dL (ref 0.3–1.2)

## 2018-09-30 LAB — SALICYLATE LEVEL

## 2018-09-30 LAB — ETHANOL

## 2018-09-30 LAB — CBC
HEMATOCRIT: 42.4 % (ref 39.0–52.0)
Hemoglobin: 13.4 g/dL (ref 13.0–17.0)
MCH: 27.7 pg (ref 26.0–34.0)
MCHC: 31.6 g/dL (ref 30.0–36.0)
MCV: 87.8 fL (ref 80.0–100.0)
PLATELETS: 314 10*3/uL (ref 150–400)
RBC: 4.83 MIL/uL (ref 4.22–5.81)
RDW: 12.7 % (ref 11.5–15.5)
WBC: 4.4 10*3/uL (ref 4.0–10.5)
nRBC: 0 % (ref 0.0–0.2)

## 2018-09-30 MED ORDER — RISPERIDONE 1 MG PO TABS: 1.0000 mg | ORAL_TABLET | Freq: Two times a day (BID) | ORAL | 0 refills | Status: DC

## 2018-09-30 MED ORDER — RISPERIDONE 1 MG PO TABS
1.0000 mg | ORAL_TABLET | Freq: Two times a day (BID) | ORAL | Status: DC
  Administered 2018-09-30: 1 mg via ORAL
  Filled 2018-09-30: qty 1

## 2018-09-30 MED ORDER — BENZTROPINE MESYLATE 1 MG PO TABS
0.5000 mg | ORAL_TABLET | Freq: Two times a day (BID) | ORAL | Status: DC
  Administered 2018-09-30: 0.5 mg via ORAL
  Filled 2018-09-30: qty 1

## 2018-09-30 MED ORDER — BENZTROPINE MESYLATE 1 MG PO TABS: 0.5000 mg | ORAL_TABLET | Freq: Two times a day (BID) | ORAL | 0 refills | Status: DC

## 2018-09-30 NOTE — ED Notes (Signed)
Pt sleeping at this time.

## 2018-09-30 NOTE — ED Notes (Signed)
Pt speaking with TTS consult. 

## 2018-09-30 NOTE — Discharge Instructions (Signed)
Avoid beer and drugs

## 2018-09-30 NOTE — BH Assessment (Signed)
Assessment Note  Cole Barrett is an 32 y.o. male. Pt denies SI/HI. Pt reports AH. Per Pt he has heard voices all of his life but they became worse this morning. Pt states his Schizophrenia diagnosis has been manageable since August. Per Pt he has taken his medication but he has not followed up with a psychiatrist. Pt feels he is in need of intensive mental health services.   Shuvon, NP recommends D/C. Shuvon, NP provided medication recommendations. ACTT team resources faxed.   Diagnosis:  F20.9 Schizophrenia  Past Medical History:  Past Medical History:  Diagnosis Date  . Homelessness   . Hypercholesterolemia   . Mental disorder   . Paranoid behavior (HCC)   . PE (pulmonary embolism)   . Schizophrenia (HCC)     History reviewed. No pertinent surgical history.  Family History:  Family History  Problem Relation Age of Onset  . Hypertension Mother     Social History:  reports that he has been smoking cigarettes. He has been smoking about 0.50 packs per day. He has never used smokeless tobacco. He reports that he drinks alcohol. He reports that he has current or past drug history. Drug: Marijuana.  Additional Social History:  Alcohol / Drug Use Pain Medications: please see mar Prescriptions: please see mar Over the Counter: please see nar History of alcohol / drug use?: No history of alcohol / drug abuse Longest period of sobriety (when/how long): NA  CIWA: CIWA-Ar BP: 134/85 Pulse Rate: 99 COWS:    Allergies:  Allergies  Allergen Reactions  . Haldol [Haloperidol] Shortness Of Breath    Home Medications:  (Not in a hospital admission)  OB/GYN Status:  No LMP for male patient.  General Assessment Data Location of Assessment: [facility] ED TTS Assessment: In system Is this a Tele or Face-to-Face Assessment?: Tele Assessment Is this an Initial Assessment or a Re-assessment for this encounter?: Initial Assessment Patient Accompanied by:: N/A Language Other than  English: No Living Arrangements: Other (Comment) What gender do you identify as?: Male Marital status: Single Maiden name: NA Pregnancy Status: No Living Arrangements: Other (Comment)(homeless) Can pt return to current living arrangement?: Yes Admission Status: Voluntary Is patient capable of signing voluntary admission?: Yes Referral Source: Self/Family/Friend Insurance type: SP     Crisis Care Plan Living Arrangements: Other (Comment)(homeless) Legal Guardian: Other:(self) Name of Psychiatrist: NA Name of Therapist: NA  Education Status Is patient currently in school?: No Is the patient employed, unemployed or receiving disability?: Unemployed  Risk to self with the past 6 months Suicidal Ideation: No Has patient been a risk to self within the past 6 months prior to admission? : Yes Suicidal Intent: No Has patient had any suicidal intent within the past 6 months prior to admission? : No Is patient at risk for suicide?: No Suicidal Plan?: No Has patient had any suicidal plan within the past 6 months prior to admission? : No Access to Means: No What has been your use of drugs/alcohol within the last 12 months?: NA Previous Attempts/Gestures: No How many times?: 0 Other Self Harm Risks: NA Triggers for Past Attempts: None known Intentional Self Injurious Behavior: None Family Suicide History: No Recent stressful life event(s): (AH) Persecutory voices/beliefs?: No Depression: No Depression Symptoms: (Pt denies) Substance abuse history and/or treatment for substance abuse?: No Suicide prevention information given to non-admitted patients: Not applicable  Risk to Others within the past 6 months Homicidal Ideation: No Does patient have any lifetime risk of violence toward others beyond  the six months prior to admission? : No Thoughts of Harm to Others: No Current Homicidal Intent: No Current Homicidal Plan: No Access to Homicidal Means: No Identified Victim:  NA History of harm to others?: No Assessment of Violence: None Noted Violent Behavior Description: NA Does patient have access to weapons?: No Criminal Charges Pending?: No Does patient have a court date: No Is patient on probation?: No  Psychosis Hallucinations: Auditory Delusions: None noted  Mental Status Report Appearance/Hygiene: Unremarkable Eye Contact: Fair Motor Activity: Freedom of movement Speech: Logical/coherent Level of Consciousness: Alert Mood: Anxious Affect: Anxious Anxiety Level: Minimal Thought Processes: Coherent, Relevant Judgement: Unimpaired Orientation: Person, Place, Time, Situation Obsessive Compulsive Thoughts/Behaviors: None  Cognitive Functioning Concentration: Normal Memory: Recent Intact, Remote Intact Is patient IDD: No Insight: Fair Impulse Control: Fair Appetite: Fair Have you had any weight changes? : No Change Sleep: No Change Total Hours of Sleep: 0 Vegetative Symptoms: None  ADLScreening [facility] Assessment Services) Patient's cognitive ability adequate to safely complete daily activities?: Yes Patient able to express need for assistance with ADLs?: Yes Independently performs ADLs?: Yes (appropriate for developmental age)  Prior Inpatient Therapy Prior Inpatient Therapy: Yes Prior Therapy Dates: multiple Prior Therapy Facilty/Provider(s): [facility] Reason for Treatment: Schizophrenia  Prior Outpatient Therapy Prior Outpatient Therapy: Yes Prior Therapy Dates: unknown Prior Therapy Facilty/Provider(s): unknown Reason for Treatment: unknown Does patient have an ACCT team?: No Does patient have Intensive In-House Services?  : No Does patient have [facility] services? : No Does patient have P4CC services?: No  ADL Screening (condition at time of admission) Patient's cognitive ability adequate to safely complete daily activities?: Yes Is the patient deaf or have difficulty hearing?: No Does the patient have difficulty seeing, even  when wearing glasses/contacts?: No Does the patient have difficulty concentrating, remembering, or making decisions?: No Patient able to express need for assistance with ADLs?: Yes Does the patient have difficulty dressing or bathing?: No Independently performs ADLs?: Yes (appropriate for developmental age) Does the patient have difficulty walking or climbing stairs?: No       Abuse/Neglect Assessment (Assessment to be complete while patient is alone) Abuse/Neglect Assessment Can Be Completed: Yes Physical Abuse: Denies Verbal Abuse: Denies Sexual Abuse: Denies Exploitation of patient/patient's resources: Denies     [employer]chant [employer] officer (For Healthcare) Does Patient Have a Medical Advance Directive?: No Would patient like information on creating a medical advance directive?: No - Patient declined          Disposition:  Disposition Initial Assessment Completed for this Encounter: Yes  On Site Evaluation by:   Reviewed with Physician:    Wolfgang Phoenix D 09/30/2018 1:11 PM

## 2018-09-30 NOTE — ED Notes (Addendum)
Woke pt - D/C paperwork given - ALL Belongings - 3 large bags - returned to pt - Pt verified all items present.

## 2018-09-30 NOTE — ED Provider Notes (Signed)
[name] EMERGENCY DEPARTMENT Provider Note   CSN: [id number] Arrival date & time: 09/30/18  8295     History   Chief Complaint Chief Complaint  Patient presents with  . Medical Clearance    HPI Cole Barrett is a 32 y.o. male.  Patient is a 32 year old male with a history of paranoid behavior, schizophrenia, PE and homelessness as well as polysubstance abuse presenting today with auditory hallucinations that are telling him they are going to kill him.  He states he often has auditory hallucinations but most time they are not a problem and its nothing threatening but today when he woke up it was threatening.  He does admit to going to a party last night and he drinks some beer and had some marijuana.  He has been taking his medications as prescribed.  He denies any chest pain, shortness of breath, abdominal pain.  He denies any suicidal thoughts or suicidal ideation.  The history is provided by the patient.    Past Medical History:  Diagnosis Date  . Homelessness   . Hypercholesterolemia   . Mental disorder   . Paranoid behavior (HCC)   . PE (pulmonary embolism)   . Schizophrenia [facility])     Patient Active Problem List   Diagnosis Date Noted  . 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 Alcohol abuse   . Auditory hallucinations   . Alcohol abuse with alcohol-induced mood disorder (HCC) 09/08/2016  . Homelessness 09/03/2016  . Person feigning illness 09/03/2016  . 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 Cannabis use disorder, mild, abuse 08/29/2016  . Pulmonary emboli (HCC) 07/13/2016  . 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 Acute left flank pain 01/02/2014  . Pulmonary embolism, bilateral (HCC) 01/02/2014    No past surgical history on file.      Home Medications    Prior to Admission medications   Medication Sig Start Date End Date Taking? Authorizing Provider    benztropine (COGENTIN) 1 MG tablet Take 0.5 tablets (0.5 mg total) by mouth 2 (two) times daily. 07/25/18   Azalia Bilis, MD  paliperidone (INVEGA) 6 MG 24 hr tablet Take 1 tablet (6 mg total) by mouth daily. Patient not taking: Reported on 07/10/2018 06/09/18   Michela Pitcher A, PA-C  risperiDONE (RISPERDAL) 1 MG tablet Take 1 tablet (1 mg total) by mouth 2 (two) times daily. 07/25/18   Azalia Bilis, MD    Family History Family History  Problem Relation Age of Onset  . Hypertension Mother     Social History Social History   Tobacco Use  . Smoking status: Current Every Day Smoker    Packs/day: 0.50    Types: Cigarettes  . Smokeless tobacco: Never Used  Substance Use Topics  . Alcohol use: Yes  . Drug use: Yes    Types: Marijuana    Comment: Last used: 2 weeks ago     Allergies   Haldol [haloperidol]   Review of Systems Review of Systems  All other systems reviewed and are negative.    Physical Exam Updated Vital Signs BP 134/85 (BP Location: Right Arm)   Pulse 99   Temp 98.2 F (36.8 C) (Oral)   Resp 18   SpO2 97%   Physical Exam  Constitutional: He is oriented to person, place, and time. He appears well-developed and well-nourished. No distress.  HENT:  Head: Normocephalic and atraumatic.  Mouth/Throat: Oropharynx is clear and moist.  Eyes: Pupils  are equal, round, and reactive to light. Conjunctivae and EOM are normal.  Neck: Normal range of motion. Neck supple.  Cardiovascular: Normal rate, regular rhythm and intact distal pulses.  No murmur heard. Pulmonary/Chest: Effort normal and breath sounds normal. No respiratory distress. He has no wheezes. He has no rales.  Abdominal: Soft. He exhibits no distension. There is no tenderness. There is no rebound and no guarding.  Musculoskeletal: Normal range of motion. He exhibits no edema or tenderness.  Neurological: He is alert and oriented to person, place, and time.  Skin: Skin is warm and dry. No rash noted. No  erythema.  Psychiatric: He has a normal mood and affect. His speech is normal and behavior is normal. He is actively hallucinating. He expresses no homicidal and no suicidal ideation.  Nursing note and vitals reviewed.    ED Treatments / Results  Labs (all labs ordered are listed, but only abnormal results are displayed) Labs Reviewed  COMPREHENSIVE METABOLIC PANEL - Abnormal; Notable for the following components:      Result Value   Glucose, Bld 114 (*)    All other components within normal limits  ACETAMINOPHEN LEVEL - Abnormal; Notable for the following components:   Acetaminophen (Tylenol), Serum <10 (*)    All other components within normal limits  ETHANOL  SALICYLATE LEVEL  CBC  RAPID URINE DRUG SCREEN, HOSP PERFORMED    EKG None  Radiology No results found.  Procedures Procedures (including critical care time)  Medications Ordered in ED Medications  benztropine (COGENTIN) tablet 0.5 mg (has no administration in time range)  risperiDONE (RISPERDAL) tablet 1 mg (has no administration in time range)     Initial Impression / Assessment and Plan / ED Course  I have reviewed the triage vital signs and the nursing notes.  Pertinent labs & imaging results that were available during my care of the patient were reviewed by me and considered in my medical decision making (see chart for details).     Patient with known mental health disorder presenting with auditory hallucinations that are now threatening saying they are going to kill him.  He denies any suicidal thoughts.  This is in the context of him drinking alcohol and using marijuana last night.  Patient is calm and cooperative.  He does state he is compliant with his medications.  Psych holding orders placed and TTS to evaluate.  Patient is medically clear.  1:32 PM Pt cleared by [facility].  Pt was given ppx for his medications for next 3 weeks  Final Clinical Impressions(s) / ED Diagnoses   Final diagnoses:    Auditory hallucinations    ED Discharge Orders         Ordered    benztropine (COGENTIN) 1 MG tablet  2 times daily     09/30/18 1333    risperiDONE (RISPERDAL) 1 MG tablet  2 times daily     09/30/18 1333           Gwyneth Sprout, MD 09/30/18 1333

## 2018-09-30 NOTE — ED Notes (Signed)
Pt has been wanded by security. 

## 2018-09-30 NOTE — ED Notes (Signed)
TTS being performed.  

## 2018-09-30 NOTE — Consult Note (Signed)
  Tele psych Assessment   Cole Barrett, 32 y.o., male patient seen via tele psych by TTS and this provider; chart reviewed and consulted with Dr. Lucianne Muss on 09/30/18.  On evaluation Cole Barrett reports he had been doing well and taking his medicine like he is suppose to.  States that he went to [facility] on "Thursday or Friday and they set me up a appointment like a month away; they said they been very busy.  But I been doing like I suppose to and taking my medicine.  The voices just got noise this morning and very irritating."  Patient also states that he went to a party last night and drank some beer.  Patient states that he has some medicine left but don't think he has enough to last until his appointment around the end of November.  Patient denies suicidal/self-harm/homicidal ideation and paranoia.  "I been doing good; its been almost a month since I last came in here."   During evaluation Cole Barrett is alert/oriented x 4; calm/cooperative; and mood is congruent with affect.  He/She does not appear to be responding to internal/external stimuli or delusional thoughts; and denies suicidal/self-harm/homicidal ideation and paranoia.  Patient continues to endorse auditory hallucinations, non command; but states they are better than they were earlier in the morning.  Patient also states that he may need prescription of medication to last until [facility] appointment.    Patient answered question appropriately.  For detailed note see TTS tele assessment note  Recommendations:  Keep scheduled appointment with [facility].  Prescription for Cogentin 0.5 mg bid and Risperdal 1 mg bid; at least 2-3 weeks of worth of medication to last until [facility] appointment around the end of November  Disposition:  Patient is psychiatrically cleared No evidence of imminent risk to self or others at present.   Patient does not meet criteria for psychiatric inpatient admission. Supportive therapy  provided about ongoing stressors. Discussed crisis plan, support from social network, calling 911, coming to the Emergency Department, and calling Suicide Hotline.   Spoke with Dr. Anitra Lauth; informed of above recommendation and disposition  [name], NP

## 2018-09-30 NOTE — ED Triage Notes (Signed)
Pt states he is taking his medications as directed but woke up this morning hearing voices that are saying they are going to kill him. Denies suicidal or homicidal ideation, denies voices telling him to kill other people. Pt calm and cooperative.

## 2018-09-30 NOTE — ED Notes (Signed)
Lunch tray ordered for pt.

## 2018-10-03 ENCOUNTER — Encounter (HOSPITAL_COMMUNITY): Payer: Self-pay | Admitting: *Deleted

## 2018-10-03 ENCOUNTER — Emergency Department (HOSPITAL_COMMUNITY)
Admission: EM | Admit: 2018-10-03 | Discharge: 2018-10-03 | Disposition: A | Discharge status: Home / Self Care | Payer: Self-pay | Attending: Emergency Medicine | Admitting: Emergency Medicine

## 2018-10-03 ENCOUNTER — Other Ambulatory Visit: Payer: Self-pay

## 2018-10-03 DIAGNOSIS — F209 Schizophrenia, unspecified: Secondary | ICD-10-CM | POA: Insufficient documentation

## 2018-10-03 DIAGNOSIS — F17210 Nicotine dependence, cigarettes, uncomplicated: Secondary | ICD-10-CM | POA: Insufficient documentation

## 2018-10-03 DIAGNOSIS — R440 Auditory hallucinations: Secondary | ICD-10-CM

## 2018-10-03 LAB — CBC WITH DIFFERENTIAL/PLATELET
ABS IMMATURE GRANULOCYTES: 0.04 10*3/uL (ref 0.00–0.07)
Basophils Absolute: 0 10*3/uL (ref 0.0–0.1)
Basophils Relative: 1 %
Eosinophils Absolute: 0.2 10*3/uL (ref 0.0–0.5)
Eosinophils Relative: 5 %
HEMATOCRIT: 38.3 % — AB (ref 39.0–52.0)
HEMOGLOBIN: 11.7 g/dL — AB (ref 13.0–17.0)
IMMATURE GRANULOCYTES: 1 %
LYMPHS ABS: 1.2 10*3/uL (ref 0.7–4.0)
Lymphocytes Relative: 33 %
MCH: 27.2 pg (ref 26.0–34.0)
MCHC: 30.5 g/dL (ref 30.0–36.0)
MCV: 89.1 fL (ref 80.0–100.0)
MONO ABS: 0.5 10*3/uL (ref 0.1–1.0)
MONOS PCT: 13 %
NEUTROS ABS: 1.7 10*3/uL (ref 1.7–7.7)
NEUTROS PCT: 47 %
Platelets: 274 10*3/uL (ref 150–400)
RBC: 4.3 MIL/uL (ref 4.22–5.81)
RDW: 12.9 % (ref 11.5–15.5)
WBC: 3.6 10*3/uL — ABNORMAL LOW (ref 4.0–10.5)
nRBC: 0 % (ref 0.0–0.2)

## 2018-10-03 LAB — COMPREHENSIVE METABOLIC PANEL
ALBUMIN: 3.6 g/dL (ref 3.5–5.0)
ALT: 34 U/L (ref 0–44)
ANION GAP: 8 (ref 5–15)
AST: 27 U/L (ref 15–41)
Alkaline Phosphatase: 35 U/L — ABNORMAL LOW (ref 38–126)
BUN: 13 mg/dL (ref 6–20)
CHLORIDE: 106 mmol/L (ref 98–111)
CO2: 25 mmol/L (ref 22–32)
CREATININE: 1.11 mg/dL (ref 0.61–1.24)
Calcium: 8.5 mg/dL — ABNORMAL LOW (ref 8.9–10.3)
GFR calc Af Amer: >60 mL/min (ref >60)
GFR calc non Af Amer: >60 mL/min (ref >60)
GLUCOSE: 137 mg/dL — AB (ref 70–99)
POTASSIUM: 4.1 mmol/L (ref 3.5–5.1)
SODIUM: 139 mmol/L (ref 135–145)
Total Bilirubin: 0.4 mg/dL (ref 0.3–1.2)
Total Protein: 6.9 g/dL (ref 6.5–8.1)

## 2018-10-03 LAB — RAPID URINE DRUG SCREEN, HOSP PERFORMED
Amphetamines: NOT DETECTED
BARBITURATES: NOT DETECTED
BENZODIAZEPINES: NOT DETECTED
Cocaine: NOT DETECTED
Opiates: NOT DETECTED
TETRAHYDROCANNABINOL: POSITIVE — AB

## 2018-10-03 LAB — ETHANOL

## 2018-10-03 MED ORDER — ONDANSETRON HCL 4 MG PO TABS: 4.0000 mg | ORAL_TABLET | Freq: Three times a day (TID) | ORAL | Status: DC | PRN

## 2018-10-03 MED ORDER — RISPERIDONE 1 MG PO TABS
1.0000 mg | ORAL_TABLET | Freq: Two times a day (BID) | ORAL | Status: DC
  Administered 2018-10-03: 1 mg via ORAL
  Filled 2018-10-03: qty 1

## 2018-10-03 MED ORDER — NICOTINE 7 MG/24HR TD PT24: 7.0000 mg | MEDICATED_PATCH | Freq: Every day | TRANSDERMAL | Status: DC

## 2018-10-03 MED ORDER — ACETAMINOPHEN 325 MG PO TABS: 650.0000 mg | ORAL_TABLET | ORAL | Status: DC | PRN

## 2018-10-03 MED ORDER — BENZTROPINE MESYLATE 0.5 MG PO TABS
0.5000 mg | ORAL_TABLET | Freq: Two times a day (BID) | ORAL | Status: DC
  Administered 2018-10-03: 0.5 mg via ORAL
  Filled 2018-10-03: qty 1

## 2018-10-03 MED ORDER — ALUM & MAG HYDROXIDE-SIMETH 200-200-20 MG/5ML PO SUSP: 30.0000 mL | Freq: Four times a day (QID) | ORAL | Status: DC | PRN

## 2018-10-03 NOTE — BH Assessment (Signed)
Tele Assessment Note   Patient Name: Cole Barrett MRN: [id number] Referring Physician: Dr. [name] Location of Patient: [place]    Location of Provider: Behavioral Health TTS Department  Cole Barrett is an 32 y.o. male presenting with auditory hallucinations. PER ED STAFF Pt stated "I got to hearing voices real loud @ 2000, they're saying they're going to kill me, I smoked pot yesterday. Patient past medical history schizophrenia, paranoia behavior and homelessness. Patient reported no command hallucinations.  Patient denies suicidal or homicidal ideation. Patient admits to drinking 2 beers daily. Patient reported to TTS clinician that voices were telling him to harm himself. Patient was drowsy and falling asleep during assessment. Clinician made RN aware. Patient tried to stay awake but fail asleep.   ETOH negative UDS +marijuana  Diagnosis: Schizophrenia  Past Medical History:  Past Medical History:  Diagnosis Date  . Homelessness   . Hypercholesterolemia   . Mental disorder   . Paranoid behavior (HCC)   . PE (pulmonary embolism)   . Schizophrenia (HCC)     History reviewed. No pertinent surgical history.  Family History:  Family History  Problem Relation Age of Onset  . Hypertension Mother     Social History:  reports that he has been smoking cigarettes. He has been smoking about 0.50 packs per day. He has never used smokeless tobacco. He reports that he drinks alcohol. He reports that he has current or past drug history. Drug: Marijuana.  Additional Social History:  Alcohol / Drug Use Pain Medications: see MAR Prescriptions: see MAR Over the Counter: see MAR  CIWA: CIWA-Ar BP: 133/72 Pulse Rate: 70 COWS:    Allergies:  Allergies  Allergen Reactions  . Haldol [Haloperidol] Shortness Of Breath    Home Medications:  (Not in a hospital admission)  OB/GYN Status:  No LMP for male patient.  General Assessment Data Location of  Assessment: [facility] ED TTS Assessment: In system Is this a Tele or Face-to-Face Assessment?: Face-to-Face Is this an Initial Assessment or a Re-assessment for this encounter?: Initial Assessment Patient Accompanied by:: N/A Language Other than English: No Living Arrangements: Homeless/Shelter What gender do you identify as?: Male Marital status: Single Living Arrangements: (homeless) Can pt return to current living arrangement?: Yes Admission Status: Voluntary Is patient capable of signing voluntary admission?: Yes Referral Source: Self/Family/Friend     Crisis Care Plan Living Arrangements: (homeless) Legal Guardian: (self) Name of Psychiatrist: NA Name of Therapist: NA  Education Status Is patient currently in school?: No Is the patient employed, unemployed or receiving disability?: Employed  Risk to self with the past 6 months Suicidal Ideation: No Has patient been a risk to self within the past 6 months prior to admission? : Yes Suicidal Intent: No Has patient had any suicidal intent within the past 6 months prior to admission? : No Is patient at risk for suicide?: No Suicidal Plan?: No Has patient had any suicidal plan within the past 6 months prior to admission? : No Access to Means: No What has been your use of drugs/alcohol within the last 12 months?: (THC) Previous Attempts/Gestures: No How many times?: (0) Other Self Harm Risks: (NA) Triggers for Past Attempts: None known Intentional Self Injurious Behavior: None Family Suicide History: No Recent stressful life event(s): (hearing voices) Persecutory voices/beliefs?: No Depression: No Depression Symptoms: (denies) Substance abuse history and/or treatment for substance abuse?: No  Risk to Others within the past 6 months Homicidal Ideation: No Does patient have any lifetime risk of violence  toward others beyond the six months prior to admission? : No Thoughts of Harm to Others: No Current Homicidal Intent:  No Current Homicidal Plan: No Access to Homicidal Means: No Identified Victim: (NA) History of harm to others?: No Assessment of Violence: None Noted Violent Behavior Description: (NA) Does patient have access to weapons?: No Criminal Charges Pending?: No Does patient have a court date: No Is patient on probation?: No  Psychosis Hallucinations: Auditory Delusions: None noted  Mental Status Report Appearance/Hygiene: Unremarkable Eye Contact: Fair Motor Activity: Unremarkable Speech: Slow, Slurred Level of Consciousness: Sleeping, Drowsy Mood: Sad Affect: Sad Anxiety Level: Minimal Thought Processes: Unable to Assess Judgement: Unimpaired Orientation: Person, Place, Time, Situation Obsessive Compulsive Thoughts/Behaviors: None  Cognitive Functioning Concentration: Unable to Assess Memory: Unable to Assess Is patient IDD: No Insight: Fair Impulse Control: Fair Appetite: Fair Have you had any weight changes? : No Change Sleep: No Change Total Hours of Sleep: (0) Vegetative Symptoms: None  ADLScreening [facility] Assessment Services) Patient's cognitive ability adequate to safely complete daily activities?: Yes Patient able to express need for assistance with ADLs?: Yes Independently performs ADLs?: Yes (appropriate for developmental age)  Prior Inpatient Therapy Prior Inpatient Therapy: Yes Prior Therapy Dates: multiple Prior Therapy Facilty/Provider(s): [facility] Reason for Treatment: Schizophrenia  Prior Outpatient Therapy Prior Outpatient Therapy: Yes Prior Therapy Dates: unknown Prior Therapy Facilty/Provider(s): unknown Reason for Treatment: unknown Does patient have an ACCT team?: No Does patient have Intensive In-House Services?  : No Does patient have [facility] services? : No Does patient have P4CC services?: No  ADL Screening (condition at time of admission) Patient's cognitive ability adequate to safely complete daily activities?: Yes Patient able to express  need for assistance with ADLs?: Yes Independently performs ADLs?: Yes (appropriate for developmental age)             [employer] officer (For Healthcare) Does Patient Have a Medical Advance Directive?: No Would patient like information on creating a medical advance directive?: No - Patient declined          Disposition:  Disposition Initial Assessment Completed for this Encounter: Yes  [name], NP, patient does not meet inpatient criteria.   This service was provided via telemedicine using a 2-way, interactive audio and video technology.  Names of all persons participating in this telemedicine service and their role in this encounter. Name: Cole Barrett Role: patient  Name: Al Corpus, [facility] Role: TTS Clinician  Name:  Role:   Name:  Role:     Burnetta Sabin 10/03/2018 5:19 AM

## 2018-10-03 NOTE — ED Notes (Signed)
Bed: WTR5 Expected date:  Expected time:  Means of arrival:  Comments: 

## 2018-10-03 NOTE — ED Notes (Signed)
Patient is resting comfortably. 

## 2018-10-03 NOTE — ED Triage Notes (Signed)
Pt stated "I got to hearing voices real loud @ 2000.  They're saying they're going to kill me.  I smoked pot yesterday."

## 2018-10-03 NOTE — ED Provider Notes (Signed)
[facility]-EMERGENCY DEPT Provider Note   CSN: [id number] Arrival date & time: 10/03/18  0038     History   Chief Complaint Chief Complaint  Patient presents with  . Hearing Voices    HPI Cole Barrett is a 32 y.o. male.  The history is provided by the patient.  He has a history of hyperlipidemia, pulmonary embolism, schizophrenia, homelessness and comes in stating that he started hearing voices at 10 PM.  The voices were very loud and stating that they were going to kill him.  There was no command hallucinations.  He denies suicidal or homicidal ideation.  He denies alcohol or drug use.  Past Medical History:  Diagnosis Date  . Homelessness   . Hypercholesterolemia   . Mental disorder   . Paranoid behavior (HCC)   . PE (pulmonary embolism)   . Schizophrenia [facility])     Patient Active Problem List   Diagnosis Date Noted  . 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 Alcohol abuse   . Auditory hallucinations   . Alcohol abuse with alcohol-induced mood disorder (HCC) 09/08/2016  . Homelessness 09/03/2016  . Person feigning illness 09/03/2016  . 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 Cannabis use disorder, mild, abuse 08/29/2016  . Pulmonary emboli (HCC) 07/13/2016  . 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 Acute left flank pain 01/02/2014  . Pulmonary embolism, bilateral (HCC) 01/02/2014    History reviewed. No pertinent surgical history.      Home Medications    Prior to Admission medications   Medication Sig Start Date End Date Taking? Authorizing Provider  benztropine (COGENTIN) 1 MG tablet Take 0.5 tablets (0.5 mg total) by mouth 2 (two) times daily. 09/30/18  Yes Plunkett, Alphonzo Lemmings, MD  risperiDONE (RISPERDAL) 1 MG tablet Take 1 tablet (1 mg total) by mouth 2 (two) times daily. 09/30/18  Yes Plunkett, Alphonzo Lemmings, MD  paliperidone (INVEGA) 6 MG 24 hr tablet Take 1 tablet  (6 mg total) by mouth daily. Patient not taking: Reported on 07/10/2018 06/09/18   Jeanie Sewer, PA-C    Family History Family History  Problem Relation Age of Onset  . Hypertension Mother     Social History Social History   Tobacco Use  . Smoking status: Current Every Day Smoker    Packs/day: 0.50    Types: Cigarettes  . Smokeless tobacco: Never Used  Substance Use Topics  . Alcohol use: Yes  . Drug use: Yes    Types: Marijuana    Comment: Last used:  10/02/18     Allergies   Haldol [haloperidol]   Review of Systems Review of Systems  Unable to perform ROS: Psychiatric disorder     Physical Exam Updated Vital Signs BP 134/79 (BP Location: Right Arm)   Pulse 77   Temp 98.3 F (36.8 C) (Oral)   Resp 18   Ht 5\' 10"  (1.778 m)   Wt 102.1 kg   SpO2 98%   BMI 32.28 kg/m   Physical Exam  Nursing note and vitals reviewed.  32 year old male, resting comfortably and in no acute distress. Vital signs are normal. Oxygen saturation is 98%, which is normal. Head is normocephalic and atraumatic. PERRLA, EOMI. Oropharynx is clear. Neck is nontender and supple without adenopathy or JVD. Back is nontender and there is no CVA tenderness. Lungs are clear without rales, wheezes, or rhonchi. Chest is nontender. Heart has regular rate and rhythm without murmur. Abdomen is  soft, flat, nontender without masses or hepatosplenomegaly and peristalsis is normoactive. Extremities have no cyanosis or edema, full range of motion is present. Skin is warm and dry without rash. Neurologic: Flat affect and appears somewhat depressed, speech is normal, cranial nerves are intact, there are no motor or sensory deficits.  ED Treatments / Results  Labs (all labs ordered are listed, but only abnormal results are displayed) Labs Reviewed  COMPREHENSIVE METABOLIC PANEL - Abnormal; Notable for the following components:      Result Value   Glucose, Bld 137 (*)    Calcium 8.5 (*)    Alkaline  Phosphatase 35 (*)    All other components within normal limits  CBC WITH DIFFERENTIAL/PLATELET - Abnormal; Notable for the following components:   WBC 3.6 (*)    Hemoglobin 11.7 (*)    HCT 38.3 (*)    All other components within normal limits  RAPID URINE DRUG SCREEN, HOSP PERFORMED - Abnormal; Notable for the following components:   Tetrahydrocannabinol POSITIVE (*)    All other components within normal limits  ETHANOL    Procedures Procedures   Medications Ordered in ED Medications  nicotine (NICODERM CQ - dosed in mg/24 hr) patch 7 mg (has no administration in time range)  alum & mag hydroxide-simeth (MAALOX/MYLANTA) 200-200-20 MG/5ML suspension 30 mL (has no administration in time range)  ondansetron (ZOFRAN) tablet 4 mg (has no administration in time range)  acetaminophen (TYLENOL) tablet 650 mg (has no administration in time range)  benztropine (COGENTIN) tablet 0.5 mg (0.5 mg Oral Given 10/03/18 0306)  risperiDONE (RISPERDAL) tablet 1 mg (1 mg Oral Given 10/03/18 0306)     Initial Impression / Assessment and Plan / ED Course  I have reviewed the triage vital signs and the nursing notes.  Pertinent lab results that were available during my care of the patient were reviewed by me and considered in my medical decision making (see chart for details).  Auditory hallucinations and patient with known history of schizophrenia.  Old records are reviewed, and he has multiple similar ED visits.  Will obtain screening labs and obtain TTS consultation.  TTS consultation is appreciated.  Patient does not meet inpatient criteria.  He is referred back to his psychiatrist for outpatient management.  Final Clinical Impressions(s) / ED Diagnoses   Final diagnoses:  Auditory hallucinations    ED Discharge Orders    None       [name], MD 10/03/18 [phone]

## 2018-10-03 NOTE — Discharge Instructions (Addendum)
Continue taking your psychiatric medications.  Follow up with your psychiatrist, or with [facility].

## 2018-10-08 ENCOUNTER — Emergency Department (HOSPITAL_COMMUNITY)
Admission: EM | Admit: 2018-10-08 | Discharge: 2018-10-09 | Disposition: A | Discharge status: Home / Self Care | Payer: Self-pay | Attending: Emergency Medicine | Admitting: Emergency Medicine

## 2018-10-08 DIAGNOSIS — E7800 Pure hypercholesterolemia, unspecified: Secondary | ICD-10-CM | POA: Insufficient documentation

## 2018-10-08 DIAGNOSIS — F209 Schizophrenia, unspecified: Secondary | ICD-10-CM | POA: Insufficient documentation

## 2018-10-08 DIAGNOSIS — Z86718 Personal history of other venous thrombosis and embolism: Secondary | ICD-10-CM | POA: Insufficient documentation

## 2018-10-08 DIAGNOSIS — F333 Major depressive disorder, recurrent, severe with psychotic symptoms: Secondary | ICD-10-CM | POA: Insufficient documentation

## 2018-10-08 DIAGNOSIS — F17210 Nicotine dependence, cigarettes, uncomplicated: Secondary | ICD-10-CM | POA: Insufficient documentation

## 2018-10-08 DIAGNOSIS — R440 Auditory hallucinations: Secondary | ICD-10-CM | POA: Diagnosis present

## 2018-10-08 NOTE — ED Notes (Signed)
Bed: WTR7 Expected date: 10/09/18 Expected time: 11:00 AM Means of arrival:  Comments:

## 2018-10-08 NOTE — ED Triage Notes (Addendum)
Patient here stating that he has voices in his head saying they will kill him. Patient here recently for same. Denies SI/HI.

## 2018-10-09 ENCOUNTER — Encounter (HOSPITAL_COMMUNITY): Payer: Self-pay | Admitting: Emergency Medicine

## 2018-10-09 ENCOUNTER — Other Ambulatory Visit: Payer: Self-pay

## 2018-10-09 LAB — RAPID URINE DRUG SCREEN, HOSP PERFORMED
AMPHETAMINES: NOT DETECTED
BARBITURATES: NOT DETECTED
Benzodiazepines: NOT DETECTED
Cocaine: NOT DETECTED
OPIATES: NOT DETECTED
TETRAHYDROCANNABINOL: NOT DETECTED

## 2018-10-09 LAB — COMPREHENSIVE METABOLIC PANEL
ALK PHOS: 40 U/L (ref 38–126)
ALT: 44 U/L (ref 0–44)
AST: 37 U/L (ref 15–41)
Albumin: 3.9 g/dL (ref 3.5–5.0)
Anion gap: 10 (ref 5–15)
BUN: 16 mg/dL (ref 6–20)
CALCIUM: 8.8 mg/dL — AB (ref 8.9–10.3)
CO2: 25 mmol/L (ref 22–32)
CREATININE: 1.17 mg/dL (ref 0.61–1.24)
Chloride: 102 mmol/L (ref 98–111)
Glucose, Bld: 122 mg/dL — ABNORMAL HIGH (ref 70–99)
Potassium: 3.8 mmol/L (ref 3.5–5.1)
Sodium: 137 mmol/L (ref 135–145)
Total Bilirubin: 0.5 mg/dL (ref 0.3–1.2)
Total Protein: 7.2 g/dL (ref 6.5–8.1)

## 2018-10-09 LAB — CBC
HCT: 38.8 % — ABNORMAL LOW (ref 39.0–52.0)
Hemoglobin: 12.3 g/dL — ABNORMAL LOW (ref 13.0–17.0)
MCH: 28.1 pg (ref 26.0–34.0)
MCHC: 31.7 g/dL (ref 30.0–36.0)
MCV: 88.8 fL (ref 80.0–100.0)
Platelets: 290 10*3/uL (ref 150–400)
RBC: 4.37 MIL/uL (ref 4.22–5.81)
RDW: 12.8 % (ref 11.5–15.5)
WBC: 4.8 10*3/uL (ref 4.0–10.5)
nRBC: 0 % (ref 0.0–0.2)

## 2018-10-09 LAB — ETHANOL

## 2018-10-09 NOTE — ED Notes (Signed)
Marcus from TTS at bedside for evaluation.

## 2018-10-09 NOTE — BH Assessment (Signed)
[facility] Assessment Progress Note   Pt too sleepy to be assessed at this time. Nurse Roseanna RainbowIlene reports that patient went to sleep quickly upon getting to the unit.

## 2018-10-09 NOTE — ED Notes (Signed)
Patient dressed out and wanded by security.  

## 2018-10-09 NOTE — ED Notes (Signed)
ED Provider at bedside. 

## 2018-10-09 NOTE — BH Assessment (Signed)
Assessment Note  Cole Barrett is an 32 y.o. male.  -Clinician reviewed note by Beverly Gust, RN.  Patient here stating that he has voices in his head saying they will kill him. Patient here recently for same. Denies SI/HI.  Patient was seen on 11/06 for the same symptoms.  Patient was initially sleeping when clinician attempted to see him.  Patient went to sleep as soon as he got to TCU according to nurse Ilene.    Clinician turned on the light and awoke patient.  Patient does say he has been hearing voices for years.  He says that lately it has been getting worse.  Patient says voices tell him they are going to kill him.  Patient denies any SI or HI.  No visual hallucinations.  Pt says he is taking his medication but he cannot name it.  He says he gets it from [facility].  Patient denies any current legal issues.  He says he has a home to stay in.  He also reports he walked to [facility] to be seen.  -Patient care discussed with [name], FNP.  He said that patient was fine to be discharged if EDP wished.  Clinician talked with Dr. Read Drivers who said he would defer that decision to psychiatry this morning.  Diagnosis: F20.9 Schizophrenia  Past Medical History:  Past Medical History:  Diagnosis Date  . Homelessness   . Hypercholesterolemia   . Mental disorder   . Paranoid behavior (HCC)   . PE (pulmonary embolism)   . Schizophrenia (HCC)     History reviewed. No pertinent surgical history.  Family History:  Family History  Problem Relation Age of Onset  . Hypertension Mother     Social History:  reports that he has been smoking cigarettes. He has been smoking about 0.50 packs per day. He has never used smokeless tobacco. He reports that he drinks alcohol. He reports that he has current or past drug history. Drug: Marijuana.  Additional Social History:  Alcohol / Drug Use Pain Medications: None Prescriptions: Pt says he can't remember what his medication name is.  Doubious of  whether he is taking any. Over the Counter: None History of alcohol / drug use?: Yes Substance #1 Name of Substance 1: Marijuana 1 - Age of First Use: teens 1 - Amount (size/oz): Unknown 1 - Frequency: Unknown 1 - Duration: on and off 1 - Last Use / Amount: 10/02/18  CIWA: CIWA-Ar BP: 137/75 Pulse Rate: 70 COWS:    Allergies:  Allergies  Allergen Reactions  . Haldol [Haloperidol] Shortness Of Breath    Home Medications:  (Not in a hospital admission)  OB/GYN Status:  No LMP for male patient.  General Assessment Data Assessment unable to be completed: Yes Reason for not completing assessment: Pt too sleepy to be seen. Location of Assessment: [facility] TTS Assessment: In system Is this a Tele or Face-to-Face Assessment?: Face-to-Face Is this an Initial Assessment or a Re-assessment for this encounter?: Initial Assessment Patient Accompanied by:: N/A Language Other than English: No Living Arrangements: Homeless/Shelter What gender do you identify as?: Male Marital status: Single Pregnancy Status: No Living Arrangements: Other (Comment)(Pt says he has a place to stay.  Veracity is undetermined.) Can pt return to current living arrangement?: Yes Admission Status: Voluntary Is patient capable of signing voluntary admission?: Yes Referral Source: Self/Family/Friend(Pt says he walked to [facility].) Insurance type: self pay     Crisis Care Plan Living Arrangements: Other (Comment)(Pt says he has a place  to stay.  Veracity is undetermined.) Name of Psychiatrist: [facility] Name of Therapist: [facility]  Education Status Is patient currently in school?: No Is the patient employed, unemployed or receiving disability?: Unemployed  Risk to self with the past 6 months Suicidal Ideation: No Has patient been a risk to self within the past 6 months prior to admission? : Yes Suicidal Intent: No Has patient had any suicidal intent within the past 6 months prior to admission? : No Is patient  at risk for suicide?: No Suicidal Plan?: No Has patient had any suicidal plan within the past 6 months prior to admission? : No Access to Means: No What has been your use of drugs/alcohol within the last 12 months?: None on UDS currently Previous Attempts/Gestures: No How many times?: 0 Other Self Harm Risks: None Triggers for Past Attempts: None known Intentional Self Injurious Behavior: None Family Suicide History: No Recent stressful life event(s): Other (Comment)(Homelessness) Persecutory voices/beliefs?: Yes Depression: Yes Depression Symptoms: Isolating Substance abuse history and/or treatment for substance abuse?: Yes Suicide prevention information given to non-admitted patients: Not applicable  Risk to Others within the past 6 months Homicidal Ideation: No Does patient have any lifetime risk of violence toward others beyond the six months prior to admission? : No Thoughts of Harm to Others: No Current Homicidal Intent: No Current Homicidal Plan: No Access to Homicidal Means: No Identified Victim: No one History of harm to others?: No Assessment of Violence: None Noted Violent Behavior Description: None noted Does patient have access to weapons?: No Criminal Charges Pending?: No Does patient have a court date: No Is patient on probation?: No  Psychosis Hallucinations: Auditory(Voices telling him they are going to kill him.) Delusions: None noted  Mental Status Report Appearance/Hygiene: Unremarkable, Body odor, In scrubs Eye Contact: Good Motor Activity: Freedom of movement, Unremarkable Speech: Logical/coherent Level of Consciousness: Drowsy Mood: Anxious Affect: Anxious Anxiety Level: Minimal Thought Processes: Coherent, Relevant Judgement: Unimpaired Orientation: Person, Place, Time, Situation Obsessive Compulsive Thoughts/Behaviors: None  Cognitive Functioning Concentration: Normal Memory: Recent Intact, Remote Intact Is patient IDD: No Insight:  Fair Impulse Control: Fair Appetite: Good Have you had any weight changes? : No Change Sleep: No Change Total Hours of Sleep: 8 Vegetative Symptoms: None  ADLScreening [facility] Assessment Services) Patient's cognitive ability adequate to safely complete daily activities?: Yes Patient able to express need for assistance with ADLs?: Yes Independently performs ADLs?: Yes (appropriate for developmental age)  Prior Inpatient Therapy Prior Inpatient Therapy: Yes Prior Therapy Dates: multiple Prior Therapy Facilty/Provider(s): [facility] Reason for Treatment: Schizophrenia  Prior Outpatient Therapy Prior Outpatient Therapy: Yes Prior Therapy Dates: current Prior Therapy Facilty/Provider(s): [facility] Reason for Treatment: med management Does patient have an ACCT team?: No Does patient have Intensive In-House Services?  : No Does patient have [facility] services? : Yes Does patient have P4CC services?: No  ADL Screening (condition at time of admission) Patient's cognitive ability adequate to safely complete daily activities?: Yes Is the patient deaf or have difficulty hearing?: No Does the patient have difficulty seeing, even when wearing glasses/contacts?: No Does the patient have difficulty concentrating, remembering, or making decisions?: No Patient able to express need for assistance with ADLs?: Yes Does the patient have difficulty dressing or bathing?: No Independently performs ADLs?: Yes (appropriate for developmental age) Does the patient have difficulty walking or climbing stairs?: No Weakness of Legs: None Weakness of Arms/Hands: None       Abuse/Neglect Assessment (Assessment to be complete while patient is alone) Physical Abuse: Denies Verbal  Abuse: Denies Sexual Abuse: Denies Exploitation of patient/patient's resources: Denies     Merchant navy officerAdvance Directives (For Healthcare) Does Patient Have a Medical Advance Directive?: No Would patient like information on creating a medical advance  directive?: No - Patient declined          Disposition:  Disposition Initial Assessment Completed for this Encounter: Yes Patient referred to: Other (Comment)(Dr. Molpus wanted psychiatry to see pt)  On Site Evaluation by:   Reviewed with Physician:    Beatriz StallionHarvey, Daymeon Fischman Ray 10/09/2018 7:06 AM

## 2018-10-09 NOTE — ED Notes (Signed)
Pt moved from triage 7 to room 28 wearing purple scrubs and had 2 bags of belongings placed in locker #28.

## 2018-10-09 NOTE — Discharge Instructions (Signed)
For your behavioral health needs, you are advised to continue treatment with [facility]: ° °     [facility] °     [address] °     [place], [place] [address] °     [phone] °

## 2018-10-09 NOTE — BH Assessment (Signed)
BHH Assessment Progress Note  Per Nelly Rout, MD, this pt does not require psychiatric hospitalization at this time.  Pt is to be discharged from [facility] with recommendation to continue treatment at [facility].  This has been included in pt's discharge instructions.  Pt's nurse has been notified.  Doylene Canning, MA Triage Specialist [phone]

## 2018-10-09 NOTE — ED Notes (Signed)
Verbal order from Dr.Molpus for TTS consult to be placed.

## 2018-10-09 NOTE — BHH Suicide Risk Assessment (Cosign Needed)
  Suicide Risk Assessment  Discharge Assessment   [facility] Discharge Suicide Risk Assessment   Principal Problem: Auditory hallucinations Discharge Diagnoses:  Patient Active Problem List   Diagnosis Date Noted  . MDD (major depressive disorder), recurrent, severe, with psychosis (HCC) [F33.3] 11/21/2017  . Alcohol abuse [F10.10]   . Auditory hallucinations [R44.0]   . Alcohol abuse with alcohol-induced mood disorder (HCC) [F10.14] 09/08/2016  . Homelessness [Z59.0] 09/03/2016  . Person feigning illness [Z76.5] 09/03/2016  . 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 Cannabis use disorder, mild, abuse [F12.10] 08/29/2016  . Pulmonary emboli (HCC) [I26.99] 07/13/2016  . Pulmonary embolism and infarction (HCC) [I26.99] 01/02/2014  . Acute left flank pain [R10.9] 01/02/2014  . Pulmonary embolism, bilateral (HCC) [I26.99] 01/02/2014    Pt was seen and chart reviewed with treatment team and Dr Lucianne Muss. Pt stated he was hearing loud voices last night and decided to come to the ED. Pt has a history of auditory hallucinations and is very well known to our ED with 13 visits in the past 6 months. Pt stated the voices are much quieter now and he would like medication management. He reports not having a treatment team but is open to referrals at this time. He reprots that he is stable and lives in a home alone. Pt is followed by [facility] for medication management and will follow up there upon discharge.  Total Time spent with patient: 30 minutes  Musculoskeletal: Strength & Muscle Tone: within normal limits Gait & Station: normal Patient leans: N/A  Psychiatric Specialty Exam:   Blood pressure 137/75, pulse 70, temperature (!) 97.5 F (36.4 C), temperature source Oral, resp. rate 16, height 5\' 10"  (1.778 m), weight 99.8 kg, SpO2 96 %.Body mass index is 31.57 kg/m.  General Appearance: Disheveled  Eye Contact::  Good  Speech:  Clear and Coherent and Normal Rate  Volume:  Normal  Mood:   Euthymic  Affect:  Congruent  Thought Process:  Coherent, Goal Directed and Linear  Orientation:  Full (Time, Place, and Person)  Thought Content:  Hallucinations: Auditory  Suicidal Thoughts:  No  Homicidal Thoughts:  No  Memory:  Immediate;   Good Recent;   Good Remote;   Fair  Judgement:  Fair  Insight:  Fair  Psychomotor Activity:  Normal  Concentration:  Good  Recall:  Good  Fund of Knowledge:Good  Language: Good  Akathisia:  No  Handed:  Right  AIMS (if indicated):     Assets:  Communication Skills Resilience Social Support  Sleep:     Cognition: WNL  ADL's:  Intact   Mental Status Per Nursing Assessment::   On Admission:     Demographic Factors:  Male, Low socioeconomic status and Unemployed  Loss Factors: Financial problems/change in socioeconomic status  Historical Factors: Impulsivity  Risk Reduction Factors:   Sense of responsibility to family  Continued Clinical Symptoms:  Schizophrenia:   Paranoid or undifferentiated type  Cognitive Features That Contribute To Risk:  Closed-mindedness    Suicide Risk:  Minimal: No identifiable suicidal ideation.  Patients presenting with no risk factors but with morbid ruminations; may be classified as minimal risk based on the severity of the depressive symptoms    Plan Of Care/Follow-up recommendations:  Activity:  as tolerated Diet:  Heart Healthy  Maryagnes Amos, FNP 10/09/2018, 10:51 AM

## 2018-10-10 NOTE — ED Provider Notes (Signed)
[facility]-EMERGENCY DEPT Provider Note   CSN: [id number] Arrival date & time: 10/08/18  2239     History   Chief Complaint Chief Complaint  Patient presents with  . Hearing Voices    HPI Cole Barrett is a 32 y.o. male.  HPI Patient presents to the emergency department with hallucinations that have been ongoing over the last 2 days.  Patient states he was recently seen and given medications.  Patient states that he is not currently suicidal.  Patient states that he feels like he needs medication adjustments.  The patient denies chest pain, shortness of breath, headache,blurred vision, neck pain, fever, cough, weakness, numbness, dizziness, anorexia, edema, abdominal pain, nausea, vomiting, diarrhea, rash, back pain, dysuria, hematemesis, bloody stool, near syncope, or syncope. Past Medical History:  Diagnosis Date  . Homelessness   . Hypercholesterolemia   . Mental disorder   . Paranoid behavior (HCC)   . PE (pulmonary embolism)   . Schizophrenia [facility])     Patient Active Problem List   Diagnosis Date Noted  . 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 Alcohol abuse   . Auditory hallucinations   . Alcohol abuse with alcohol-induced mood disorder (HCC) 09/08/2016  . Homelessness 09/03/2016  . Person feigning illness 09/03/2016  . 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 Cannabis use disorder, mild, abuse 08/29/2016  . Pulmonary emboli (HCC) 07/13/2016  . 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 Acute left flank pain 01/02/2014  . Pulmonary embolism, bilateral (HCC) 01/02/2014    History reviewed. No pertinent surgical history.      Home Medications    Prior to Admission medications   Medication Sig Start Date End Date Taking? Authorizing Provider  benztropine (COGENTIN) 1 MG tablet Take 0.5 tablets (0.5 mg total) by mouth 2 (two) times daily. 09/30/18  Yes Plunkett,  Alphonzo Lemmings, MD  risperiDONE (RISPERDAL) 1 MG tablet Take 1 tablet (1 mg total) by mouth 2 (two) times daily. 09/30/18  Yes Gwyneth Sprout, MD    Family History Family History  Problem Relation Age of Onset  . Hypertension Mother     Social History Social History   Tobacco Use  . Smoking status: Current Every Day Smoker    Packs/day: 0.50    Types: Cigarettes  . Smokeless tobacco: Never Used  Substance Use Topics  . Alcohol use: Yes  . Drug use: Yes    Types: Marijuana    Comment: Last used:  10/02/18     Allergies   Haldol [haloperidol]   Review of Systems Review of Systems All other systems negative except as documented in the HPI. All pertinent positives and negatives as reviewed in the HPI.  Physical Exam Updated Vital Signs BP 129/86 (BP Location: Right Arm)   Pulse 84   Temp 98.1 F (36.7 C) (Oral)   Resp 17   Ht 5\' 10"  (1.778 m)   Wt 99.8 kg   SpO2 98%   BMI 31.57 kg/m   Physical Exam  Constitutional: He is oriented to person, place, and time. He appears well-developed and well-nourished. No distress.  HENT:  Head: Normocephalic and atraumatic.  Mouth/Throat: Oropharynx is clear and moist.  Eyes: Pupils are equal, round, and reactive to light.  Neck: Normal range of motion. Neck supple.  Cardiovascular: Normal rate, regular rhythm and normal heart sounds. Exam reveals no gallop and no friction rub.  No murmur heard. Pulmonary/Chest: Effort normal and breath sounds normal. No respiratory  distress. He has no wheezes.  Abdominal: Soft. Bowel sounds are normal. He exhibits no distension. There is no tenderness.  Neurological: He is alert and oriented to person, place, and time. He exhibits normal muscle tone. Coordination normal.  Skin: Skin is warm and dry. Capillary refill takes less than 2 seconds. No rash noted. No erythema.  Psychiatric: He has a normal mood and affect. His behavior is normal.  Nursing note and vitals reviewed.    ED Treatments /  Results  Labs (all labs ordered are listed, but only abnormal results are displayed) Labs Reviewed  COMPREHENSIVE METABOLIC PANEL - Abnormal; Notable for the following components:      Result Value   Glucose, Bld 122 (*)    Calcium 8.8 (*)    All other components within normal limits  CBC - Abnormal; Notable for the following components:   Hemoglobin 12.3 (*)    HCT 38.8 (*)    All other components within normal limits  ETHANOL  RAPID URINE DRUG SCREEN, HOSP PERFORMED    EKG None  Radiology No results found.  Procedures Procedures (including critical care time)  Medications Ordered in ED Medications - No data to display   Initial Impression / Assessment and Plan / ED Course  I have reviewed the triage vital signs and the nursing notes.  Pertinent labs & imaging results that were available during my care of the patient were reviewed by me and considered in my medical decision making (see chart for details).     The patient will need TTS assessment for his hallucinations.  Final Clinical Impressions(s) / ED Diagnoses   Final diagnoses:  Auditory hallucinations  MDD (major depressive disorder), recurrent, severe, with psychosis [facility](HCC)    ED Discharge Orders    None       Charlestine NightLawyer, Pinchas, PA-C 10/10/18 [id number]    Paula LibraMolpus, John, MD 10/10/18 [phone]

## 2018-10-16 ENCOUNTER — Other Ambulatory Visit: Payer: Self-pay

## 2018-10-16 ENCOUNTER — Encounter (HOSPITAL_COMMUNITY): Payer: Self-pay | Admitting: Emergency Medicine

## 2018-10-16 ENCOUNTER — Emergency Department (HOSPITAL_COMMUNITY)
Admission: EM | Admit: 2018-10-16 | Discharge: 2018-10-17 | Disposition: A | Discharge status: Home / Self Care | Payer: Self-pay | Attending: Emergency Medicine | Admitting: Emergency Medicine

## 2018-10-16 DIAGNOSIS — Z79899 Other long term (current) drug therapy: Secondary | ICD-10-CM | POA: Insufficient documentation

## 2018-10-16 DIAGNOSIS — F209 Schizophrenia, unspecified: Secondary | ICD-10-CM | POA: Insufficient documentation

## 2018-10-16 DIAGNOSIS — F333 Major depressive disorder, recurrent, severe with psychotic symptoms: Secondary | ICD-10-CM | POA: Insufficient documentation

## 2018-10-16 DIAGNOSIS — F17210 Nicotine dependence, cigarettes, uncomplicated: Secondary | ICD-10-CM | POA: Insufficient documentation

## 2018-10-16 DIAGNOSIS — R440 Auditory hallucinations: Secondary | ICD-10-CM

## 2018-10-16 DIAGNOSIS — Z590 Homelessness: Secondary | ICD-10-CM | POA: Insufficient documentation

## 2018-10-16 LAB — COMPREHENSIVE METABOLIC PANEL
ALBUMIN: 3.9 g/dL (ref 3.5–5.0)
ALK PHOS: 37 U/L — AB (ref 38–126)
ALT: 48 U/L — ABNORMAL HIGH (ref 0–44)
AST: 25 U/L (ref 15–41)
Anion gap: 11 (ref 5–15)
BUN: 10 mg/dL (ref 6–20)
CO2: 22 mmol/L (ref 22–32)
Calcium: 8.7 mg/dL — ABNORMAL LOW (ref 8.9–10.3)
Chloride: 104 mmol/L (ref 98–111)
Creatinine, Ser: 1.06 mg/dL (ref 0.61–1.24)
GFR calc Af Amer: >60 mL/min (ref >60)
GFR calc non Af Amer: >60 mL/min (ref >60)
GLUCOSE: 102 mg/dL — AB (ref 70–99)
Potassium: 3.9 mmol/L (ref 3.5–5.1)
Sodium: 137 mmol/L (ref 135–145)
Total Bilirubin: 0.4 mg/dL (ref 0.3–1.2)
Total Protein: 7.2 g/dL (ref 6.5–8.1)

## 2018-10-16 LAB — CBC
HEMATOCRIT: 41.5 % (ref 39.0–52.0)
HEMOGLOBIN: 12.9 g/dL — AB (ref 13.0–17.0)
MCH: 27.6 pg (ref 26.0–34.0)
MCHC: 31.1 g/dL (ref 30.0–36.0)
MCV: 88.9 fL (ref 80.0–100.0)
Platelets: 305 10*3/uL (ref 150–400)
RBC: 4.67 MIL/uL (ref 4.22–5.81)
RDW: 12.8 % (ref 11.5–15.5)
WBC: 5.8 10*3/uL (ref 4.0–10.5)
nRBC: 0 % (ref 0.0–0.2)

## 2018-10-16 LAB — ETHANOL: Alcohol, Ethyl (B): <10 mg/dL (ref <10)

## 2018-10-16 NOTE — ED Triage Notes (Signed)
Patient states that he has been taking his medicine, but he is having auditory hallucinations, the voices are telling him that they are going to kill him.  Denies any SI/HI.

## 2018-10-16 NOTE — ED Provider Notes (Signed)
[name]   CSN: [id number] Arrival date & time: 10/16/18  2219     History   Chief Complaint Chief Complaint  Patient presents with  . Hallucinations    HPI Cole Barrett is a 32 y.o. male with history of homelessness, schizophrenia, presents for evaluation of worsening auditory hallucinations.  [facility] and was told to come to the ER if "the voices got louder ".  He states "the voices got ladder this morning ".  He states that he hears voices telling him to kill himself.  He denies suicidal ideation or homicidal ideation.  He states "I was hoping I could go to behavioral health and they could help with my medications".  He smokes cigarettes and drinks alcohol occasionally, denies recreational drug use.  Denies any other complaints at this time.  The history is provided by the patient.    Past Medical History:  Diagnosis Date  . Homelessness   . Hypercholesterolemia   . Mental disorder   . Paranoid behavior (HCC)   . PE (pulmonary embolism)   . Schizophrenia [facility](HCC)     Patient Active Problem List   Diagnosis Date Noted  . 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 Alcohol abuse   . Auditory hallucinations   . Alcohol abuse with alcohol-induced mood disorder (HCC) 09/08/2016  . Homelessness 09/03/2016  . Person feigning illness 09/03/2016  . 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 Cannabis use disorder, mild, abuse 08/29/2016  . Pulmonary emboli (HCC) 07/13/2016  . 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 Acute left flank pain 01/02/2014  . Pulmonary embolism, bilateral (HCC) 01/02/2014    History reviewed. No pertinent surgical history.      Home Medications    Prior to Admission medications   Medication Sig Start Date End Date Taking? Authorizing Provider  benztropine (COGENTIN) 1 MG tablet  Take 0.5 tablets (0.5 mg total) by mouth 2 (two) times daily. 09/30/18  Yes Plunkett, Alphonzo LemmingsWhitney, MD  risperiDONE (RISPERDAL) 1 MG tablet Take 1 tablet (1 mg total) by mouth 2 (two) times daily. 09/30/18  Yes Gwyneth SproutPlunkett, Whitney, MD    Family History Family History  Problem Relation Age of Onset  . Hypertension Mother     Social History Social History   Tobacco Use  . Smoking status: Current Every Day Smoker    Packs/day: 0.50    Types: Cigarettes  . Smokeless tobacco: Never Used  Substance Use Topics  . Alcohol use: Yes  . Drug use: Yes    Types: Marijuana    Comment: Last used:  10/02/18     Allergies   Haldol [haloperidol]   Review of Systems Review of Systems  Constitutional: Negative for chills and fever.  Respiratory: Negative for shortness of breath.   Cardiovascular: Negative for chest pain.  Gastrointestinal: Negative for abdominal pain, nausea and vomiting.  Psychiatric/Behavioral: Positive for hallucinations.  All other systems reviewed and are negative.    Physical Exam Updated Vital Signs BP 132/82 (BP Location: Right Arm)   Pulse 84   Temp 98.3 F (36.8 C) (Oral)   Resp 17   SpO2 99%   Physical Exam  Constitutional: He appears well-developed and well-nourished. No distress.  HENT:  Head: Normocephalic and atraumatic.  Eyes: Conjunctivae are normal. Right eye exhibits no discharge. Left eye exhibits no discharge.  Neck: No JVD present. No tracheal deviation present.  Cardiovascular: Normal rate, regular rhythm, normal heart sounds and intact distal pulses.  Pulmonary/Chest: Effort normal and breath sounds normal.  Abdominal: Soft. Bowel sounds are normal. He exhibits no distension.  Musculoskeletal: He exhibits no edema.  Neurological: He is alert.  Skin: Skin is dry. No erythema.  Psychiatric: His speech is normal. His affect is blunt. He is withdrawn and actively hallucinating. He expresses no homicidal and no suicidal ideation. He expresses no  suicidal plans and no homicidal plans.  Nursing note and vitals reviewed.    ED Treatments / Results  Labs (all labs ordered are listed, but only abnormal results are displayed) Labs Reviewed  COMPREHENSIVE METABOLIC PANEL - Abnormal; Notable for the following components:      Result Value   Glucose, Bld 102 (*)    Calcium 8.7 (*)    ALT 48 (*)    Alkaline Phosphatase 37 (*)    All other components within normal limits  CBC - Abnormal; Notable for the following components:   Hemoglobin 12.9 (*)    All other components within normal limits  ETHANOL  RAPID URINE DRUG SCREEN, HOSP PERFORMED    EKG None  Radiology No results found.  Procedures Procedures (including critical care time)  Medications Ordered in ED Medications - No data to display   Initial Impression / Assessment and Plan / ED Course  I have reviewed the triage vital signs and the nursing notes.  Pertinent labs & imaging results that were available during my care of the patient were reviewed by me and considered in my medical decision making (see chart for details).    Patient presents for evaluation of worsening auditory hallucinations.  He is afebrile, vital signs are stable.  He has no other complaints at this time.  Physical examination and screening labs reviewed by me are reassuring and at his baseline.  Denies homicidal or suicidal ideation but states he was told to present to the ED by his counselor if his hallucinations worsen.  He is medically cleared for TTS evaluation at this time.  He is here voluntarily and may require IVC if he attempts to leave prior to TTS evaluation.  Final Clinical Impressions(s) / ED Diagnoses   Final diagnoses:  Auditory hallucination    ED Discharge Orders    None       Bennye Alm 10/16/18 2345    Terrilee Files, MD 10/17/18 1447

## 2018-10-17 LAB — RAPID URINE DRUG SCREEN, HOSP PERFORMED
Amphetamines: NOT DETECTED
BENZODIAZEPINES: NOT DETECTED
Barbiturates: NOT DETECTED
COCAINE: NOT DETECTED
OPIATES: NOT DETECTED
Tetrahydrocannabinol: NOT DETECTED

## 2018-10-17 NOTE — Consult Note (Signed)
Telepsychiatry Note  Reason for Consult:  Auditory Hallucinations Referring Physician:  Charlestine Nighthristopher Lawyer Location of Patient: [facility] Location of Provider: Behavioral Health TTS Department   HPI:  Cole BrasilChristopher Barrett is a 32 y.o. male who [facility] with auditory hallucinations. Cole Barrett has had greater than 20 visits to the ED in 2019 for hallucinations. On exam Cole Barrett is alert and oriented x 4, pleasant, and cooperative. Speech is clear and coherent. States that [facility] and told to go to the ED if his voices worsened.  States that the voices tell him that they are going to kill him. Denies the voices are command in nature. Denies visual hallucinations. Denies suicidal thoughts. States that he has never had any thoughts of killing himself.  Reports that he is compliant with his medications and last took them this morning before going to work. No indication that he is responding to internal stimuli.    General Appearance: Casual and Well Groomed  Eye Contact:  Good  Speech:  Clear and Coherent and Normal Rate  Volume:  Normal  Mood:  Depressed  Affect:  Blunt and Congruent  Orientation:  Full (Time, Place, and Person)  Thought Content:  Logical and Hallucinations: Auditory  Suicidal Thoughts:  No  Homicidal Thoughts:  No  Judgement:  Good  Insight:  Fair  Psychomotor Activity:  Normal  Assets:  Communication Skills Desire for Improvement Housing Leisure Time Physical Health  ADL's:  Intact      Disposition: No evidence of imminent risk to self or others at present.   Patient does not meet criteria for psychiatric inpatient admission. Supportive therapy provided about ongoing stressors. Discussed crisis plan, support from social network, calling 911, coming to the Emergency Department, and calling Suicide Hotline. [facility]

## 2018-10-17 NOTE — ED Provider Notes (Signed)
Behavioral health recommendations have been reviewed.  Patient does not meet inpatient criteria, is to be discharged [facility].   Gilda CreasePollina, Jatniel J, MD 10/17/18 [phone]

## 2018-10-17 NOTE — ED Notes (Signed)
Patient allowed by RN to take a shower before leaving; [facility]

## 2018-10-17 NOTE — ED Notes (Signed)
Nira ConnJason Berry, NP, patient does not meet inpatient criteria. [facility]. Monique, RN, notified of the disposition.

## 2018-10-17 NOTE — BH Assessment (Signed)
Tele Assessment Note   Patient Name: Cole Barrett MRN: [id number] Referring Physician: Michela Pitcher, PA Location of Patient: [facility]   Bed: [phone] Location of Provider: Behavioral Health TTS Department  Cole Barrett is an 32 y.o. male with history of schizophrenia, presents for evaluation of worsening auditory hallucinations. Cole Barrett has been to the ED 20 times in 2019 for hallucinations. Patient reported compliance with medication. Patient reported going to [facility] earlier this week and was told to come to the ED if the voices got louder. Patient reported the voices got louder this morning "telling me to kill myself. Patient denied command voices. Patient denies visual hallucinations. Patient states he has never had any thoughts of killing himself. Patient denied SI, HI and drug usage. Patient reported only needing medication adjustment. Patient was cooperative during assessment, repetitively stating "I need to go to [facility] to see a doctor because these voices are getting worse, they telling me to kill myself".   UDS negative ETOH negative  Diagnosis: Schizophrenia  Past Medical History:  Past Medical History:  Diagnosis Date  . Homelessness   . Hypercholesterolemia   . Mental disorder   . Paranoid behavior (HCC)   . PE (pulmonary embolism)   . Schizophrenia (HCC)     History reviewed. No pertinent surgical history.  Family History:  Family History  Problem Relation Age of Onset  . Hypertension Mother     Social History:  reports that he has been smoking cigarettes. He has been smoking about 0.50 packs per day. He has never used smokeless tobacco. He reports that he drinks alcohol. He reports that he has current or past drug history. Drug: Marijuana.  Additional Social History:  Alcohol / Drug Use Pain Medications: see MAR Prescriptions: see MAR Over the Counter: see MAR  CIWA: CIWA-Ar BP: 132/82 Pulse Rate: 84 COWS:    Allergies:  Allergies   Allergen Reactions  . Haldol [Haloperidol] Shortness Of Breath    Home Medications:  (Not in a hospital admission)  OB/GYN Status:  No LMP for male patient.  General Assessment Data Location of Assessment: [facility] ED TTS Assessment: In system Is this a Tele or Face-to-Face Assessment?: Tele Assessment Is this an Initial Assessment or a Re-assessment for this encounter?: Initial Assessment Patient Accompanied by:: N/A Language Other than English: No Living Arrangements: (home alone) What gender do you identify as?: Male Marital status: Single Pregnancy Status: (n/a) Living Arrangements: Alone Can pt return to current living arrangement?: Yes Admission Status: Voluntary Is patient capable of signing voluntary admission?: Yes Referral Source: Self/Family/Friend     Crisis Care Plan Living Arrangements: Alone Legal Guardian: (self) Name of Psychiatrist: [facility] Name of Therapist: [facility]  Education Status Is patient currently in school?: No Is the patient employed, unemployed or receiving disability?: Employed([employer] for 2 years)  Risk to self with the past 6 months Suicidal Ideation: No Has patient been a risk to self within the past 6 months prior to admission? : No Suicidal Intent: No Has patient had any suicidal intent within the past 6 months prior to admission? : No Is patient at risk for suicide?: No Suicidal Plan?: No Has patient had any suicidal plan within the past 6 months prior to admission? : No Access to Means: No What has been your use of drugs/alcohol within the last 12 months?: (cocaine and marijuana) Previous Attempts/Gestures: No How many times?: (0) Other Self Harm Risks: (none) Triggers for Past Attempts: (mental illness) Intentional Self Injurious Behavior: None Family Suicide  History: No Recent stressful life event(s): (hearing voices) Persecutory voices/beliefs?: No Depression: Yes Depression Symptoms: Feeling worthless/self pity Substance  abuse history and/or treatment for substance abuse?: Yes  Risk to Others within the past 6 months Homicidal Ideation: No Does patient have any lifetime risk of violence toward others beyond the six months prior to admission? : No Thoughts of Harm to Others: No Current Homicidal Intent: No Current Homicidal Plan: No Access to Homicidal Means: No Identified Victim: (NA) History of harm to others?: No Assessment of Violence: None Noted Violent Behavior Description: (N"A) Does patient have access to weapons?: No Criminal Charges Pending?: No Does patient have a court date: No Is patient on probation?: No  Psychosis Hallucinations: Auditory Delusions: None noted  Mental Status Report Appearance/Hygiene: Unremarkable, Body odor, In scrubs Eye Contact: Unable to Assess Motor Activity: Freedom of movement Speech: Logical/coherent Level of Consciousness: Alert Mood: Helpless Affect: Anxious Anxiety Level: Minimal Thought Processes: Coherent Judgement: Partial Orientation: Person, Place, Time, Situation Obsessive Compulsive Thoughts/Behaviors: Minimal  Cognitive Functioning Concentration: Normal Memory: Recent Intact, Remote Intact Is patient IDD: No Insight: Fair Impulse Control: Poor Appetite: (Fair) Have you had any weight changes? : No Change Sleep: No Change Vegetative Symptoms: None  ADLScreening [facility]) Patient's cognitive ability adequate to safely complete daily activities?: Yes Patient able to express need for assistance with ADLs?: Yes Independently performs ADLs?: Yes (appropriate for developmental age)  Prior Inpatient Therapy Prior Inpatient Therapy: Yes Prior Therapy Dates: multiple [facility] Reason for Treatment: Schizophrenia  Prior Outpatient Therapy Prior Outpatient Therapy: Yes Prior Therapy Dates: current Prior Therapy Facilty/Provider(s): [facility] Reason for Treatment: med management Does patient  have an ACCT team?: No Does patient have Intensive In-House Services?  : No Does patient have [facility] services? : Yes Does patient have P4CC services?: No  ADL Screening (condition at time of admission) Patient's cognitive ability adequate to safely complete daily activities?: Yes Patient able to express need for assistance with ADLs?: Yes Independently performs ADLs?: Yes (appropriate for developmental age)  Disposition:  Disposition Initial Assessment Completed for this Encounter: Yes  Nira ConnJason Berry, NP, patient does not meet inpatient criteria. [facility]. Monique, RN, notified of the disposition.   This service was provided via telemedicine using a 2-way, interactive audio and video technology.  Names of all persons participating in this telemedicine service and their role in this encounter. Name: Cole Barrett Role: patient  Name: Al CorpusLatisha Yomaira Solar, [facility] Role: TTS Clinician  Name:  Role:   Name: Role:     Burnetta SabinLatisha D Xzavien Harada, [facility] 10/17/2018 3:02 AM

## 2018-10-18 ENCOUNTER — Emergency Department (HOSPITAL_COMMUNITY)
Admission: EM | Admit: 2018-10-18 | Discharge: 2018-10-19 | Disposition: A | Discharge status: Home / Self Care | Payer: Self-pay | Attending: Emergency Medicine | Admitting: Emergency Medicine

## 2018-10-18 ENCOUNTER — Encounter (HOSPITAL_COMMUNITY): Payer: Self-pay | Admitting: Emergency Medicine

## 2018-10-18 ENCOUNTER — Other Ambulatory Visit: Payer: Self-pay

## 2018-10-18 ENCOUNTER — Emergency Department (HOSPITAL_COMMUNITY): Payer: Self-pay

## 2018-10-18 DIAGNOSIS — E7800 Pure hypercholesterolemia, unspecified: Secondary | ICD-10-CM | POA: Insufficient documentation

## 2018-10-18 DIAGNOSIS — Z86718 Personal history of other venous thrombosis and embolism: Secondary | ICD-10-CM | POA: Insufficient documentation

## 2018-10-18 DIAGNOSIS — Z590 Homelessness: Secondary | ICD-10-CM | POA: Insufficient documentation

## 2018-10-18 DIAGNOSIS — Z711 Person with feared health complaint in whom no diagnosis is made: Secondary | ICD-10-CM

## 2018-10-18 DIAGNOSIS — R109 Unspecified abdominal pain: Secondary | ICD-10-CM | POA: Insufficient documentation

## 2018-10-18 DIAGNOSIS — F17210 Nicotine dependence, cigarettes, uncomplicated: Secondary | ICD-10-CM | POA: Insufficient documentation

## 2018-10-18 NOTE — ED Triage Notes (Signed)
Pt form home with c/o abdominal pain. Pt states he thinks he has a blood clot. Pt lifted his shirt and no redness was noted. Pt has hx of PE. Pt stated he was taking a blood thinner but stopped taking it when his pain went away. Pt denies CP SOB

## 2018-10-18 NOTE — ED Provider Notes (Signed)
[facility]-EMERGENCY DEPT Provider Note: Cole Barrett Cole Nila, MD, FACEP  CSN: [id number] MRN: [id number] ARRIVAL: 10/18/18 at 2250 ROOM: WA15/WA15   CHIEF COMPLAINT  Abdominal Pain   HISTORY OF PRESENT ILLNESS  10/18/18 11:11 PM Cole HawkingChristopher Adam Barrett is a 32 y.o. male with a history of homelessness, schizophrenia and pulmonary embolism no longer on anticoagulants.  He is here because of "pain shooting up my body" that began earlier today.  He points to his legs and his abdomen but states this feels the same as when he had his pulmonary embolism.  He rates his pain as an 8 out of 10.  He denies associated shortness of breath, chest pain, leg swelling, nausea, vomiting or diarrhea.  He is requesting an x-ray to look for a blood clot.  He has had multiple ED visits for auditory hallucinations and substance abuse.   Past Medical History:  Diagnosis Date  . Homelessness   . Hypercholesterolemia   . Mental disorder   . Paranoid behavior (HCC)   . PE (pulmonary embolism)   . Schizophrenia (HCC)     History reviewed. No pertinent surgical history.  Family History  Problem Relation Age of Onset  . Hypertension Mother     Social History   Tobacco Use  . Smoking status: Current Every Day Smoker    Packs/day: 0.50    Types: Cigarettes  . Smokeless tobacco: Never Used  Substance Use Topics  . Alcohol use: Yes  . Drug use: Yes    Types: Marijuana    Comment: Last used:  10/02/18    Prior to Admission medications   Medication Sig Start Date End Date Taking? Authorizing Provider  benztropine (COGENTIN) 1 MG tablet Take 0.5 tablets (0.5 mg total) by mouth 2 (two) times daily. 09/30/18  Yes Plunkett, Alphonzo LemmingsWhitney, MD  risperiDONE (RISPERDAL) 1 MG tablet Take 1 tablet (1 mg total) by mouth 2 (two) times daily. 09/30/18  Yes Gwyneth SproutPlunkett, Whitney, MD    Allergies Haldol [haloperidol]   REVIEW OF SYSTEMS  Negative except as noted here or in the History of Present Illness.   PHYSICAL EXAMINATION   Initial Vital Signs Blood pressure 134/77, pulse 92, temperature 98.9 F (37.2 C), temperature source Oral, resp. rate 18, height 5\' 10"  (1.778 m), weight 99.8 kg, SpO2 99 %.  Examination General: Well-developed, well-nourished male in no acute distress; appearance consistent with age of record HENT: normocephalic; atraumatic Eyes: pupils equal, round and reactive to light; extraocular muscles intact Neck: supple Heart: regular rate and rhythm Lungs: clear to auscultation bilaterally Abdomen: soft; nondistended; mild diffuse tenderness; no masses or hepatosplenomegaly; bowel sounds present Extremities: No deformity; full range of motion; pulses normal Neurologic: Awake, alert; motor function intact in all extremities and symmetric; no facial droop Skin: Warm and dry Psychiatric: Flat affect   RESULTS  Summary of this visit's results, reviewed by myself:   EKG Interpretation  Date/Time:    Ventricular Rate:    PR Interval:    QRS Duration:   QT Interval:    QTC Calculation:   R Axis:     Text Interpretation:        Laboratory Studies: Results for orders placed or performed during the hospital encounter of 10/18/18 (from the past 24 hour(s))  D-dimer, quantitative (not at [facility])     Status: None   Collection Time: 10/18/18 11:48 PM  Result Value Ref Range   D-Dimer, Quant <0.27 0.00 - 0.50 ug/mL-FEU  Comprehensive metabolic panel     Status: Abnormal  Collection Time: 10/18/18 11:48 PM  Result Value Ref Range   Sodium 138 135 - 145 mmol/L   Potassium 3.8 3.5 - 5.1 mmol/L   Chloride 103 98 - 111 mmol/L   CO2 26 22 - 32 mmol/L   Glucose, Bld 112 (H) 70 - 99 mg/dL   BUN 14 6 - 20 mg/dL   Creatinine, Ser 2.95 0.61 - 1.24 mg/dL   Calcium 8.7 (L) 8.9 - 10.3 mg/dL   Total Protein 7.2 6.5 - 8.1 g/dL   Albumin 3.9 3.5 - 5.0 g/dL   AST 27 15 - 41 U/L   ALT 42 0 - 44 U/L   Alkaline Phosphatase 40 38 - 126 U/L   Total Bilirubin 0.5 0.3 - 1.2 mg/dL   GFR calc non Af Amer  >60 >60 mL/min   GFR calc Af Amer >60 >60 mL/min   Anion gap 9 5 - 15  CBC with Differential/Platelet     Status: Abnormal   Collection Time: 10/18/18 11:48 PM  Result Value Ref Range   WBC 5.4 4.0 - 10.5 K/uL   RBC 4.43 4.22 - 5.81 MIL/uL   Hemoglobin 12.5 (L) 13.0 - 17.0 g/dL   HCT 62.1 30.8 - 65.7 %   MCV 88.7 80.0 - 100.0 fL   MCH 28.2 26.0 - 34.0 pg   MCHC 31.8 30.0 - 36.0 g/dL   RDW 84.6 96.2 - 95.2 %   Platelets 291 150 - 400 K/uL   nRBC 0.0 0.0 - 0.2 %   Neutrophils Relative % 50 %   Neutro Abs 2.7 1.7 - 7.7 K/uL   Lymphocytes Relative 34 %   Lymphs Abs 1.8 0.7 - 4.0 K/uL   Monocytes Relative 10 %   Monocytes Absolute 0.6 0.1 - 1.0 K/uL   Eosinophils Relative 4 %   Eosinophils Absolute 0.2 0.0 - 0.5 K/uL   Basophils Relative 1 %   Basophils Absolute 0.0 0.0 - 0.1 K/uL   Immature Granulocytes 1 %   Abs Immature Granulocytes 0.06 0.00 - 0.07 K/uL   Imaging Studies: Dg Abd Acute W/chest  Result Date: 10/18/2018 CLINICAL DATA:  Abdominal pain. Previous history of pulmonary embolus. Stop taking blood thinners. Current smoker. EXAM: DG ABDOMEN ACUTE W/ 1V CHEST COMPARISON:  09/09/2018 FINDINGS: Slightly shallow inspiration. Normal heart size and pulmonary vascularity. No focal airspace disease or consolidation in the lungs. No blunting of costophrenic angles. No pneumothorax. Mediastinal contours appear intact. Scattered gas and stool in the colon. No small or large bowel distention. No free intra-abdominal air. No abnormal air-fluid levels. No radiopaque stones. Visualized bones appear intact. IMPRESSION: No evidence of active pulmonary disease. Normal nonobstructive bowel gas pattern. Electronically Signed   By: Burman Nieves M.D.   On: 10/18/2018 23:50    ED COURSE and MDM  Nursing notes and initial vitals signs, including pulse oximetry, reviewed.  Vitals:   10/18/18 2301  BP: 134/77  Pulse: 92  Resp: 18  Temp: 98.9 F (37.2 C)  TempSrc: Oral  SpO2: 99%    Weight: 99.8 kg  Height: 5\' 10"  (1.778 m)   There is no evidence of significant abnormality on the patient's diagnostic studies.  His d-dimer and vital signs are normal.  PROCEDURES    ED DIAGNOSES     ICD-10-CM   1. Concern about disease without diagnosis Z71.1        Khushboo Chuck, MD 10/19/18 [phone]

## 2018-10-19 LAB — COMPREHENSIVE METABOLIC PANEL
ALK PHOS: 40 U/L (ref 38–126)
ALT: 42 U/L (ref 0–44)
AST: 27 U/L (ref 15–41)
Albumin: 3.9 g/dL (ref 3.5–5.0)
Anion gap: 9 (ref 5–15)
BUN: 14 mg/dL (ref 6–20)
CALCIUM: 8.7 mg/dL — AB (ref 8.9–10.3)
CO2: 26 mmol/L (ref 22–32)
CREATININE: 1.01 mg/dL (ref 0.61–1.24)
Chloride: 103 mmol/L (ref 98–111)
GFR calc Af Amer: >60 mL/min (ref >60)
Glucose, Bld: 112 mg/dL — ABNORMAL HIGH (ref 70–99)
Potassium: 3.8 mmol/L (ref 3.5–5.1)
SODIUM: 138 mmol/L (ref 135–145)
Total Bilirubin: 0.5 mg/dL (ref 0.3–1.2)
Total Protein: 7.2 g/dL (ref 6.5–8.1)

## 2018-10-19 LAB — CBC WITH DIFFERENTIAL/PLATELET
Abs Immature Granulocytes: 0.06 10*3/uL (ref 0.00–0.07)
BASOS ABS: 0 10*3/uL (ref 0.0–0.1)
Basophils Relative: 1 %
EOS ABS: 0.2 10*3/uL (ref 0.0–0.5)
EOS PCT: 4 %
HEMATOCRIT: 39.3 % (ref 39.0–52.0)
Hemoglobin: 12.5 g/dL — ABNORMAL LOW (ref 13.0–17.0)
Immature Granulocytes: 1 %
LYMPHS ABS: 1.8 10*3/uL (ref 0.7–4.0)
Lymphocytes Relative: 34 %
MCH: 28.2 pg (ref 26.0–34.0)
MCHC: 31.8 g/dL (ref 30.0–36.0)
MCV: 88.7 fL (ref 80.0–100.0)
Monocytes Absolute: 0.6 10*3/uL (ref 0.1–1.0)
Monocytes Relative: 10 %
Neutro Abs: 2.7 10*3/uL (ref 1.7–7.7)
Neutrophils Relative %: 50 %
Platelets: 291 10*3/uL (ref 150–400)
RBC: 4.43 MIL/uL (ref 4.22–5.81)
RDW: 12.9 % (ref 11.5–15.5)
WBC: 5.4 10*3/uL (ref 4.0–10.5)
nRBC: 0 % (ref 0.0–0.2)

## 2018-10-19 LAB — D-DIMER, QUANTITATIVE: D-Dimer, Quant: <0.27 ug/mL-FEU (ref 0.00–0.50)

## 2018-10-25 ENCOUNTER — Emergency Department (HOSPITAL_COMMUNITY)
Admission: EM | Admit: 2018-10-25 | Discharge: 2018-10-25 | Disposition: A | Discharge status: Home / Self Care | Payer: Self-pay | Attending: Emergency Medicine | Admitting: Emergency Medicine

## 2018-10-25 ENCOUNTER — Encounter (HOSPITAL_COMMUNITY): Payer: Self-pay | Admitting: Emergency Medicine

## 2018-10-25 ENCOUNTER — Other Ambulatory Visit: Payer: Self-pay

## 2018-10-25 DIAGNOSIS — F17210 Nicotine dependence, cigarettes, uncomplicated: Secondary | ICD-10-CM | POA: Insufficient documentation

## 2018-10-25 DIAGNOSIS — R440 Auditory hallucinations: Secondary | ICD-10-CM | POA: Insufficient documentation

## 2018-10-25 DIAGNOSIS — Z86711 Personal history of pulmonary embolism: Secondary | ICD-10-CM | POA: Insufficient documentation

## 2018-10-25 DIAGNOSIS — F209 Schizophrenia, unspecified: Secondary | ICD-10-CM | POA: Insufficient documentation

## 2018-10-25 DIAGNOSIS — F329 Major depressive disorder, single episode, unspecified: Secondary | ICD-10-CM | POA: Insufficient documentation

## 2018-10-25 DIAGNOSIS — Z79899 Other long term (current) drug therapy: Secondary | ICD-10-CM | POA: Insufficient documentation

## 2018-10-25 DIAGNOSIS — Z008 Encounter for other general examination: Secondary | ICD-10-CM | POA: Insufficient documentation

## 2018-10-25 DIAGNOSIS — Z590 Homelessness: Secondary | ICD-10-CM | POA: Insufficient documentation

## 2018-10-25 DIAGNOSIS — R443 Hallucinations, unspecified: Secondary | ICD-10-CM

## 2018-10-25 LAB — CBC WITH DIFFERENTIAL/PLATELET
ABS IMMATURE GRANULOCYTES: 0.06 10*3/uL (ref 0.00–0.07)
BASOS PCT: 1 %
Basophils Absolute: 0 10*3/uL (ref 0.0–0.1)
Eosinophils Absolute: 0.2 10*3/uL (ref 0.0–0.5)
Eosinophils Relative: 5 %
HEMATOCRIT: 41.9 % (ref 39.0–52.0)
Hemoglobin: 13.1 g/dL (ref 13.0–17.0)
Immature Granulocytes: 2 %
LYMPHS ABS: 1.2 10*3/uL (ref 0.7–4.0)
Lymphocytes Relative: 33 %
MCH: 26.9 pg (ref 26.0–34.0)
MCHC: 31.3 g/dL (ref 30.0–36.0)
MCV: 86 fL (ref 80.0–100.0)
MONOS PCT: 11 %
Monocytes Absolute: 0.4 10*3/uL (ref 0.1–1.0)
NEUTROS ABS: 1.8 10*3/uL (ref 1.7–7.7)
Neutrophils Relative %: 48 %
PLATELETS: 346 10*3/uL (ref 150–400)
RBC: 4.87 MIL/uL (ref 4.22–5.81)
RDW: 12.7 % (ref 11.5–15.5)
WBC: 3.7 10*3/uL — ABNORMAL LOW (ref 4.0–10.5)
nRBC: 0 % (ref 0.0–0.2)

## 2018-10-25 LAB — ETHANOL: Alcohol, Ethyl (B): <10 mg/dL (ref <10)

## 2018-10-25 LAB — RAPID URINE DRUG SCREEN, HOSP PERFORMED
Amphetamines: NOT DETECTED
Barbiturates: NOT DETECTED
Benzodiazepines: NOT DETECTED
COCAINE: NOT DETECTED
OPIATES: NOT DETECTED
Tetrahydrocannabinol: NOT DETECTED

## 2018-10-25 LAB — COMPREHENSIVE METABOLIC PANEL
ALK PHOS: 38 U/L (ref 38–126)
ALT: 39 U/L (ref 0–44)
AST: 28 U/L (ref 15–41)
Albumin: 4 g/dL (ref 3.5–5.0)
Anion gap: 9 (ref 5–15)
BILIRUBIN TOTAL: 0.7 mg/dL (ref 0.3–1.2)
BUN: 12 mg/dL (ref 6–20)
CALCIUM: 9 mg/dL (ref 8.9–10.3)
CHLORIDE: 106 mmol/L (ref 98–111)
CO2: 22 mmol/L (ref 22–32)
CREATININE: 0.96 mg/dL (ref 0.61–1.24)
Glucose, Bld: 104 mg/dL — ABNORMAL HIGH (ref 70–99)
Potassium: 4.1 mmol/L (ref 3.5–5.1)
Sodium: 137 mmol/L (ref 135–145)
Total Protein: 7.3 g/dL (ref 6.5–8.1)

## 2018-10-25 MED ORDER — BENZTROPINE MESYLATE 0.5 MG PO TABS
0.5000 mg | ORAL_TABLET | Freq: Once | ORAL | Status: AC
  Administered 2018-10-25: 0.5 mg via ORAL
  Filled 2018-10-25: qty 1

## 2018-10-25 MED ORDER — ONDANSETRON HCL 4 MG PO TABS: 4.0000 mg | ORAL_TABLET | Freq: Three times a day (TID) | ORAL | Status: DC | PRN

## 2018-10-25 MED ORDER — ACETAMINOPHEN 325 MG PO TABS: 650.0000 mg | ORAL_TABLET | ORAL | Status: DC | PRN

## 2018-10-25 MED ORDER — NICOTINE 21 MG/24HR TD PT24: 21.0000 mg | MEDICATED_PATCH | Freq: Every day | TRANSDERMAL | Status: DC

## 2018-10-25 MED ORDER — RISPERIDONE 1 MG PO TABS
1.0000 mg | ORAL_TABLET | Freq: Once | ORAL | Status: AC
  Administered 2018-10-25: 1 mg via ORAL
  Filled 2018-10-25: qty 1

## 2018-10-25 MED ORDER — ALUM & MAG HYDROXIDE-SIMETH 200-200-20 MG/5ML PO SUSP: 30.0000 mL | Freq: Four times a day (QID) | ORAL | Status: DC | PRN

## 2018-10-25 NOTE — ED Triage Notes (Addendum)
Pt homeless, in with c/o of hearing voices that want to kill him since this morning. Denies any SI/HI or VH. Pt stating [facility]. States has been compliant with all meds

## 2018-10-25 NOTE — ED Triage Notes (Signed)
Pt from home came to ED because he was hearing voices telling him to kill his self. Currently denies intent to harm self but states the voices are getting louder. Denies owning a weapon or  access to weapons . Denies plan to harm self.

## 2018-10-25 NOTE — BH Assessment (Signed)
Tele Assessment Note   Patient Name: Cole Barrett MRN: [id number] Referring Physician: Mesner Location of Patient: [facility] ED Location of Provider: Behavioral Health TTS Department  Cole Barrett is an 32 y.o. male presenting voluntarily to [facility] ED complaining on worsening auditory hallucinations. Patient has accessed ED more than 30 times this year with a similar complaint. Patient states the voices are telling him they are going to kill him. He reports that he has been experiencing AH for many years but at times they are worse than others. Patient denies VH/SI/HI. He just "wants the voices off." Patient is seen at [facility] for outpatient medication management. He cannot recall what medications he takes but states he takes them as prescribed. Patient denies any substance use, however per chart review patient has a history of marijuana use. Patient denies any current criminal charges or history of abuse. Patient denies feeling anxious or depressed. He reports that she sleeps 8 hours per night and eats well. Patient has a history of homelessness but reports he now has a place to live.  Patient was alert and oriented x 4. He was dressed in scrubs but not wearing the shirt. His mood is pleasant and his affect congruent. He did not appear to be responding to internal stimuli at this time. His insight, judgement, and impulse control are fair.  Diagnosis: F20.9 Schizophrenia  Past Medical History:  Past Medical History:  Diagnosis Date  . Homelessness   . Hypercholesterolemia   . Mental disorder   . Paranoid behavior (HCC)   . PE (pulmonary embolism)   . Schizophrenia (HCC)     History reviewed. No pertinent surgical history.  Family History:  Family History  Problem Relation Age of Onset  . Hypertension Mother     Social History:  reports that he has been smoking cigarettes. He has been smoking about 0.50 packs per day. He has never used smokeless tobacco. He reports that he  drinks alcohol. He reports that he has current or past drug history. Drug: Marijuana.  Additional Social History:  Alcohol / Drug Use Pain Medications: see MAR Prescriptions: see MAR Over the Counter: see MAR History of alcohol / drug use?: No history of alcohol / drug abuse Longest period of sobriety (when/how long): patient denies  CIWA: CIWA-Ar BP: 133/82 Pulse Rate: 93 COWS:    Allergies:  Allergies  Allergen Reactions  . Haldol [Haloperidol] Shortness Of Breath    Home Medications:  (Not in a hospital admission)  OB/GYN Status:  No LMP for male patient.  General Assessment Data Location of Assessment: [facility] ED TTS Assessment: In system Is this a Tele or Face-to-Face Assessment?: Tele Assessment Is this an Initial Assessment or a Re-assessment for this encounter?: Initial Assessment Patient Accompanied by:: N/A Language Other than English: No Living Arrangements: Other (Comment)(house) What gender do you identify as?: Male Marital status: Single Pregnancy Status: No Living Arrangements: Alone Can pt return to current living arrangement?: Yes Admission Status: Voluntary Is patient capable of signing voluntary admission?: Yes Referral Source: Self/Family/Friend Insurance type: none     Crisis Care Plan Living Arrangements: Alone Name of Psychiatrist: [name] Name of Therapist: NOne     Risk to self with the past 6 months Suicidal Ideation: No Has patient been a risk to self within the past 6 months prior to admission? : No Suicidal Intent: No Has patient had any suicidal intent within the past 6 months prior to admission? : No Is patient at risk for suicide?: No  Suicidal Plan?: No Has patient had any suicidal plan within the past 6 months prior to admission? : No Access to Means: No What has been your use of drugs/alcohol within the last 12 months?: (patient denies, UDS in progress) Previous Attempts/Gestures: No How many times?: 0 Other Self Harm Risks:  none Triggers for Past Attempts: None known Intentional Self Injurious Behavior: None Family Suicide History: No Recent stressful life event(s): (none reported) Persecutory voices/beliefs?: Yes Depression: No Depression Symptoms: (patient denies symptoms) Substance abuse history and/or treatment for substance abuse?: Yes Suicide prevention information given to non-admitted patients: Not applicable  Risk to Others within the past 6 months Homicidal Ideation: No Does patient have any lifetime risk of violence toward others beyond the six months prior to admission? : No Thoughts of Harm to Others: No Current Homicidal Intent: No Current Homicidal Plan: No Access to Homicidal Means: No Identified Victim: none History of harm to others?: No Assessment of Violence: None Noted Violent Behavior Description: none noted Does patient have access to weapons?: No Criminal Charges Pending?: No Does patient have a court date: No Is patient on probation?: No  Psychosis Hallucinations: Auditory Delusions: None noted  Mental Status Report Appearance/Hygiene: Unremarkable, In scrubs Eye Contact: Fair Motor Activity: Freedom of movement Speech: Logical/coherent Level of Consciousness: Alert Mood: Pleasant Affect: Anxious Anxiety Level: None Thought Processes: Coherent, Relevant Judgement: Partial Orientation: Person, Place, Time, Situation Obsessive Compulsive Thoughts/Behaviors: None  Cognitive Functioning Concentration: Normal Memory: Recent Intact, Remote Intact Is patient IDD: No Insight: Fair Impulse Control: Fair Appetite: Good Have you had any weight changes? : No Change Sleep: No Change Total Hours of Sleep: 8 Vegetative Symptoms: None  ADLScreening [facility] Assessment Services) Patient's cognitive ability adequate to safely complete daily activities?: Yes Patient able to express need for assistance with ADLs?: Yes Independently performs ADLs?: Yes (appropriate for  developmental age)  Prior Inpatient Therapy Prior Inpatient Therapy: Yes Prior Therapy Dates: multiple [facility] Reason for Treatment: Schizophrenia  Prior Outpatient Therapy Prior Outpatient Therapy: Yes Prior Therapy Dates: current [facility] Reason for Treatment: med management Does patient have an ACCT team?: No Does patient have Intensive In-House Services?  : No Does patient have [facility] services? : Yes Does patient have P4CC services?: No  ADL Screening (condition at time of admission) Patient's cognitive ability adequate to safely complete daily activities?: Yes Is the patient deaf or have difficulty hearing?: No Does the patient have difficulty seeing, even when wearing glasses/contacts?: No Does the patient have difficulty concentrating, remembering, or making decisions?: No Patient able to express need for assistance with ADLs?: Yes Does the patient have difficulty dressing or bathing?: No Independently performs ADLs?: Yes (appropriate for developmental age) Does the patient have difficulty walking or climbing stairs?: No Weakness of Legs: None Weakness of Arms/Hands: None     Therapy Consults (therapy consults require a physician order) PT Evaluation Needed: No OT Evalulation Needed: No SLP Evaluation Needed: No Abuse/Neglect Assessment (Assessment to be complete while patient is alone) Physical Abuse: Denies Verbal Abuse: Denies Sexual Abuse: Denies Exploitation of patient/patient's resources: Denies   Consults Spiritual Care Consult Needed: No Social Work Consult Needed: No Merchant navy officerAdvance Directives (For Healthcare) Does Patient Have a Medical Advance Directive?: No Would patient like information on creating a medical advance directive?: No - Patient declined          Disposition: Per Claudette Headonrad Withrow, NP patient does not meet in patient criteria. D/C with outpatient  resources.  Disposition Initial Assessment Completed for this Encounter: Yes Patient referred to: Outpatient clinic referral  This service was provided via telemedicine using a 2-way, interactive audio and video technology.  Names of all persons participating in this telemedicine service and their role in this encounter. Name: Celedonio Miyamoto, [place] Role: TTS  Name: Loretha Brasil Role: patient  Name: Claudette Head, NP Role: provider  Name:  Role:     Celedonio Miyamoto 10/25/2018 11:28 AM

## 2018-10-25 NOTE — ED Provider Notes (Signed)
[name] EMERGENCY DEPARTMENT Provider Note   CSN: [id number] Arrival date & time: 10/25/18  1013     History   Chief Complaint Chief Complaint  Patient presents with  . Psychiatric Evaluation    HPI Cole Barrett is a 32 y.o. male.  HPI Cole Barrett is a 32 y.o. male presents to ED with complaint of hallucinations. Pt states he has chronic hallucinations, however today symptoms got worse. States he is followed by [facility]. States he last saw them 2 weeks ago. Frequent visits to ED for the same. He states voices are getting louder. He does not know who voices belong to. States they are saying they are going to kill him and also telling him to kill himself. States "I just need to go to behavioral health to be monitored and to make voices stop talking to me." denies SI or HI otherwise. No drugs or alcohol.  Pt denies any physical pain. Specifically denies any headache or head injury. No CP. No SOB. Compliant with all his meds.    Past Medical History:  Diagnosis Date  . Homelessness   . Hypercholesterolemia   . Mental disorder   . Paranoid behavior (HCC)   . PE (pulmonary embolism)   . Schizophrenia [facility])     Patient Active Problem List   Diagnosis Date Noted  . 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 Alcohol abuse   . Auditory hallucinations   . Alcohol abuse with alcohol-induced mood disorder (HCC) 09/08/2016  . Homelessness 09/03/2016  . Person feigning illness 09/03/2016  . 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 Cannabis use disorder, mild, abuse 08/29/2016  . Pulmonary emboli (HCC) 07/13/2016  . 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 Acute left flank pain 01/02/2014  . Pulmonary embolism, bilateral (HCC) 01/02/2014    No past surgical history on file.      Home Medications    Prior to Admission medications   Medication Sig Start Date End Date  Taking? Authorizing Provider  benztropine (COGENTIN) 1 MG tablet Take 0.5 tablets (0.5 mg total) by mouth 2 (two) times daily. 09/30/18   Gwyneth Sprout, MD  risperiDONE (RISPERDAL) 1 MG tablet Take 1 tablet (1 mg total) by mouth 2 (two) times daily. 09/30/18   Gwyneth Sprout, MD    Family History Family History  Problem Relation Age of Onset  . Hypertension Mother     Social History Social History   Tobacco Use  . Smoking status: Current Every Day Smoker    Packs/day: 0.50    Types: Cigarettes  . Smokeless tobacco: Never Used  Substance Use Topics  . Alcohol use: Yes  . Drug use: Yes    Types: Marijuana    Comment: Last used:  10/02/18     Allergies   Haldol [haloperidol]   Review of Systems Review of Systems  Constitutional: Negative for chills and fever.  Respiratory: Negative for chest tightness and shortness of breath.   Cardiovascular: Negative for chest pain.  Skin: Negative for rash.  Neurological: Negative for headaches.  Psychiatric/Behavioral: Positive for behavioral problems, hallucinations and sleep disturbance. The patient is nervous/anxious.   All other systems reviewed and are negative.    Physical Exam Updated Vital Signs Wt 99.8 kg   BMI 31.57 kg/m   Physical Exam  Constitutional: He appears well-developed and well-nourished. No distress.  HENT:  Head: Normocephalic and atraumatic.  Eyes: Conjunctivae are normal.  Neck: Neck supple.  Cardiovascular: Normal rate, regular rhythm and normal heart sounds.  Pulmonary/Chest: Effort normal. No respiratory distress. He has no wheezes. He has no rales.  Abdominal: Soft. Bowel sounds are normal. He exhibits no distension. There is no tenderness. There is no rebound.  Musculoskeletal: He exhibits no edema.  Neurological: He is alert.  Skin: Skin is warm and dry.  Psychiatric: His mood appears anxious. His speech is rapid and/or pressured. Thought content is paranoid and delusional.  Nursing note  and vitals reviewed.    ED Treatments / Results  Labs (all labs ordered are listed, but only abnormal results are displayed) Labs Reviewed  CBC WITH DIFFERENTIAL/PLATELET - Abnormal; Notable for the following components:      Result Value   WBC 3.7 (*)    All other components within normal limits  COMPREHENSIVE METABOLIC PANEL - Abnormal; Notable for the following components:   Glucose, Bld 104 (*)    All other components within normal limits  RAPID URINE DRUG SCREEN, HOSP PERFORMED  ETHANOL    EKG None  Radiology No results found.  Procedures Procedures (including critical care time)  Medications Ordered in ED Medications - No data to display   Initial Impression / Assessment and Plan / ED Course  I have reviewed the triage vital signs and the nursing notes.  Pertinent labs & imaging results that were available during my care of the patient were reviewed by me and considered in my medical decision making (see chart for details).     Pt with worsening auditory hallucinations, voices telling him to hurt himself. multiplve visits for the same. Given some SI with hallucinations, will get TTS consult  12:24 PM Pt cleared by TTS.  I do not think he needs any futher medical treatment or work up. Plan to dc home. Will have him [facility].   Vitals:   10/25/18 1039 10/25/18 1045  BP:  133/82  Pulse:  93  SpO2:  98%  Weight: 99.8 kg      Final Clinical Impressions(s) / ED Diagnoses   Final diagnoses:  Hallucinations    ED Discharge Orders    None       Jaynie CrumbleKirichenko, Hurshel Bouillon, PA-C 10/25/18 1508    Mesner, Barbara CowerJason, MD 10/26/18 1056

## 2018-10-25 NOTE — Discharge Instructions (Addendum)
Please follow up with [facility] °

## 2018-10-25 NOTE — ED Provider Notes (Signed)
[facility]-EMERGENCY DEPT Provider Note   CSN: [id number] Arrival date & time: 10/25/18  2000     History   Chief Complaint Chief Complaint  Patient presents with  . Medical Clearance     AV hallucinations    HPI Cole Barrett is a 32 y.o. male.  HPI  32 year old male with a history of schizophrenia presents with auditory hallucinations.  He states that the voices are very loud at this time.  Started earlier today but is otherwise a chronic problem.  The voices are telling him they will kill him, which is what they always say.  However he does not have any suicidal or homicidal thoughts or plans.  He otherwise is feeling well including no fever, headache, or any other pain.  He states he last took his medicines this morning and takes them once a day but he cannot remember the name of his medicine.  Past Medical History:  Diagnosis Date  . Homelessness   . Hypercholesterolemia   . Mental disorder   . Paranoid behavior (HCC)   . PE (pulmonary embolism)   . Schizophrenia [facility])     Patient Active Problem List   Diagnosis Date Noted  . 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 Alcohol abuse   . Auditory hallucinations   . Alcohol abuse with alcohol-induced mood disorder (HCC) 09/08/2016  . Homelessness 09/03/2016  . Person feigning illness 09/03/2016  . 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 Cannabis use disorder, mild, abuse 08/29/2016  . Pulmonary emboli (HCC) 07/13/2016  . 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 Acute left flank pain 01/02/2014  . Pulmonary embolism, bilateral (HCC) 01/02/2014    History reviewed. No pertinent surgical history.      Home Medications    Prior to Admission medications   Medication Sig Start Date End Date Taking? Authorizing Provider  benztropine (COGENTIN) 1 MG tablet Take 0.5 tablets (0.5 mg total) by mouth 2 (two) times daily.  09/30/18   Gwyneth Sprout, MD  risperiDONE (RISPERDAL) 1 MG tablet Take 1 tablet (1 mg total) by mouth 2 (two) times daily. 09/30/18   Gwyneth Sprout, MD    Family History Family History  Problem Relation Age of Onset  . Hypertension Mother     Social History Social History   Tobacco Use  . Smoking status: Current Every Day Smoker    Packs/day: 0.50    Types: Cigarettes  . Smokeless tobacco: Never Used  Substance Use Topics  . Alcohol use: Yes  . Drug use: Yes    Types: Marijuana    Comment: Last used:  10/02/18     Allergies   Haldol [haloperidol]   Review of Systems Review of Systems  Constitutional: Negative for fever.  Gastrointestinal: Negative for abdominal pain and vomiting.  Neurological: Negative for headaches.  Psychiatric/Behavioral: Positive for hallucinations. Negative for self-injury.  All other systems reviewed and are negative.    Physical Exam Updated Vital Signs BP (!) 145/83 (BP Location: Left Arm)   Pulse 100   Temp 98.3 F (36.8 C) (Oral)   Resp 18   SpO2 100%   Physical Exam  Constitutional: He is oriented to person, place, and time. He appears well-developed and well-nourished. No distress.  HENT:  Head: Normocephalic and atraumatic.  Right Ear: External ear normal.  Left Ear: External ear normal.  Nose: Nose normal.  Eyes: Right eye exhibits no discharge. Left eye exhibits no discharge.  Neck:  Neck supple.  Cardiovascular: Normal rate, regular rhythm and normal heart sounds.  Pulmonary/Chest: Effort normal and breath sounds normal.  Abdominal: Soft. There is no tenderness.  Musculoskeletal: He exhibits no edema.  Neurological: He is alert and oriented to person, place, and time.  Skin: Skin is warm and dry. He is not diaphoretic.  Psychiatric: His speech is normal and behavior is normal. His mood appears not anxious. He is not actively hallucinating. He expresses no homicidal and no suicidal ideation.  Nursing note and vitals  reviewed.    ED Treatments / Results  Labs (all labs ordered are listed, but only abnormal results are displayed) Labs Reviewed - No data to display  EKG None  Radiology No results found.  Procedures Procedures (including critical care time)  Medications Ordered in ED Medications  benztropine (COGENTIN) tablet 0.5 mg (has no administration in time range)  risperiDONE (RISPERDAL) tablet 1 mg (has no administration in time range)     Initial Impression / Assessment and Plan / ED Course  I have reviewed the triage vital signs and the nursing notes.  Pertinent labs & imaging results that were available during my care of the patient were reviewed by me and considered in my medical decision making (see chart for details).     Patient presents with recurrent/chronic auditory hallucinations.  [facility] and had a TTS evaluation.  At that time it was recommended that he have outpatient management.  I think this is still reasonable plan as he is not acutely suicidal, homicidal, and appears comfortable and not acutely psychotic.  Of note, he states he is only taking his medicine once per day but all of his prescriptions indicate he should be taking them twice daily.  I will give him his evening dose and instructed him to take his meds as prescribed.  Follow-up with [facility].  We discussed return precautions.  He has no medical complaints.  Final Clinical Impressions(s) / ED Diagnoses   Final diagnoses:  Auditory hallucinations    ED Discharge Orders    None       Pricilla LovelessGoldston, Lynnelle Mesmer, MD 10/25/18 2051

## 2018-10-25 NOTE — Discharge Instructions (Signed)
If you have feelings of wanting to hurt or kill yourself or hurt or kill anyone else, or if you develop any other new/concerning symptoms then return to the ER for evaluation.  Otherwise, [facility]

## 2018-10-29 ENCOUNTER — Other Ambulatory Visit: Payer: Self-pay

## 2018-10-29 ENCOUNTER — Encounter (HOSPITAL_COMMUNITY): Payer: Self-pay

## 2018-10-29 ENCOUNTER — Emergency Department (HOSPITAL_COMMUNITY)
Admission: EM | Admit: 2018-10-29 | Discharge: 2018-10-29 | Disposition: A | Discharge status: Home / Self Care | Payer: Self-pay | Attending: Emergency Medicine | Admitting: Emergency Medicine

## 2018-10-29 DIAGNOSIS — R440 Auditory hallucinations: Secondary | ICD-10-CM

## 2018-10-29 DIAGNOSIS — F17210 Nicotine dependence, cigarettes, uncomplicated: Secondary | ICD-10-CM | POA: Insufficient documentation

## 2018-10-29 DIAGNOSIS — F209 Schizophrenia, unspecified: Secondary | ICD-10-CM | POA: Insufficient documentation

## 2018-10-29 NOTE — ED Triage Notes (Signed)
Upon entering triage room, patient is sleeping and comfortable. Easily arousable. Pt reports auditory hallucinations. He states that the voices say that they are going to "get him." Pt denies SI/HI or drug use. Denies any physical complaints.

## 2018-10-29 NOTE — ED Notes (Signed)
Pt received his papers, but did not want vitals rechecked or to sign. Ambulatory in no distress.

## 2018-10-29 NOTE — ED Notes (Signed)
Pt got up and ambulated to the restroom and stated that he was ready for discharge.

## 2018-10-29 NOTE — BH Assessment (Addendum)
Clinicain staffed pt's cased with Dr. Read DriversMolpus and Nira ConnJason Berry, NP. Per Nira ConnJason Berry, NP pt can be discharged. TTS consult is not needed.   Redmond Pullingreylese D Arcelia Pals, MS, [facility], [facility] Triage Specialist [phone]

## 2018-10-29 NOTE — ED Notes (Signed)
Bed: [facility] Expected date:  Expected time:  Means of arrival:  Comments: 

## 2018-10-29 NOTE — ED Provider Notes (Signed)
[facility]-EMERGENCY DEPT Provider Note: Cole Dell, MD, FACEP  CSN: [id number] MRN: [id number] ARRIVAL: 10/29/18 at 0239 ROOM: [facility]4/[facility]4   CHIEF COMPLAINT  Hallucinations   HISTORY OF PRESENT ILLNESS  10/29/18 3:31 AM Cole Barrett is a 32 y.o. male with a history of schizophrenia and multiple ED visits for auditory hallucinations.  He is here again complaining of auditory hallucinations.  He states that the voices are saying they are going to "get him".  He denies SI, HI or drug use.  He has no physical complaints.  He is reticent to answer further questions.   Past Medical History:  Diagnosis Date  . Homelessness   . Hypercholesterolemia   . Mental disorder   . Paranoid behavior (HCC)   . PE (pulmonary embolism)   . Schizophrenia (HCC)     History reviewed. No pertinent surgical history.  Family History  Problem Relation Age of Onset  . Hypertension Mother     Social History   Tobacco Use  . Smoking status: Current Every Day Smoker    Packs/day: 0.50    Types: Cigarettes  . Smokeless tobacco: Never Used  Substance Use Topics  . Alcohol use: Yes  . Drug use: Yes    Types: Marijuana    Comment: Last used:  10/02/18    Prior to Admission medications   Medication Sig Start Date End Date Taking? Authorizing Provider  benztropine (COGENTIN) 1 MG tablet Take 0.5 tablets (0.5 mg total) by mouth 2 (two) times daily. 09/30/18  Yes Plunkett, Alphonzo Lemmings, MD  risperiDONE (RISPERDAL) 1 MG tablet Take 1 tablet (1 mg total) by mouth 2 (two) times daily. 09/30/18  Yes Gwyneth Sprout, MD    Allergies Haldol [haloperidol]   REVIEW OF SYSTEMS  Negative except as noted here or in the History of Present Illness.   PHYSICAL EXAMINATION  Initial Vital Signs Blood pressure 112/60, pulse 74, temperature 98.2 F (36.8 C), temperature source Oral, resp. rate 15, SpO2 97 %.  Examination General: Well-developed, well-nourished male in no acute distress; appearance  consistent with age of record HENT: normocephalic; atraumatic Eyes: pupils equal, round and reactive to light; extraocular muscles intact Neck: supple Heart: regular rate and rhythm Lungs: clear to auscultation bilaterally Abdomen: soft; nondistended; nontender; bowel sounds present Extremities: No deformity; full range of motion Neurologic: Sleeping but readily awakened; motor function intact in all extremities and symmetric; no facial droop Skin: Warm and dry Psychiatric: Denies SI, HI; reticent to engage in conversation   RESULTS  Summary of this visit's results, reviewed by myself:   EKG Interpretation  Date/Time:    Ventricular Rate:    PR Interval:    QRS Duration:   QT Interval:    QTC Calculation:   R Axis:     Text Interpretation:        Laboratory Studies: No results found for this or any previous visit (from the past 24 hour(s)). Imaging Studies: No results found.  ED COURSE and MDM  Nursing notes and initial vitals signs, including pulse oximetry, reviewed.  Vitals:   10/29/18 0312  BP: 112/60  Pulse: 74  Resp: 15  Temp: 98.2 F (36.8 C)  TempSrc: Oral  SpO2: 97%   3:51 AM Discussed with Arvil Persons, NP of psychiatry.  He is familiar with the patient and states that the patient is at his baseline.  Without suicidal ideation or homicidal ideation he feels he is safe for discharge.  PROCEDURES    ED DIAGNOSES  ICD-10-CM   1. Auditory hallucination R44.0        Jorryn Hershberger, Jonny RuizJohn, MD 10/29/18 [phone]

## 2018-11-05 ENCOUNTER — Emergency Department (HOSPITAL_COMMUNITY): Admission: EM | Admit: 2018-11-05 | Discharge: 2018-11-05 | Discharge status: Against Medical Advice | Payer: Self-pay

## 2018-11-05 ENCOUNTER — Emergency Department (HOSPITAL_COMMUNITY)
Admission: EM | Admit: 2018-11-05 | Discharge: 2018-11-05 | Disposition: A | Discharge status: Home / Self Care | Payer: Self-pay | Attending: Emergency Medicine | Admitting: Emergency Medicine

## 2018-11-05 DIAGNOSIS — F17210 Nicotine dependence, cigarettes, uncomplicated: Secondary | ICD-10-CM | POA: Insufficient documentation

## 2018-11-05 DIAGNOSIS — R440 Auditory hallucinations: Secondary | ICD-10-CM

## 2018-11-05 DIAGNOSIS — Z765 Malingerer [conscious simulation]: Secondary | ICD-10-CM

## 2018-11-05 DIAGNOSIS — Z79899 Other long term (current) drug therapy: Secondary | ICD-10-CM | POA: Insufficient documentation

## 2018-11-05 DIAGNOSIS — Z590 Homelessness: Secondary | ICD-10-CM | POA: Insufficient documentation

## 2018-11-05 DIAGNOSIS — Z86711 Personal history of pulmonary embolism: Secondary | ICD-10-CM | POA: Insufficient documentation

## 2018-11-05 DIAGNOSIS — F209 Schizophrenia, unspecified: Secondary | ICD-10-CM | POA: Insufficient documentation

## 2018-11-05 DIAGNOSIS — F4329 Adjustment disorder with other symptoms: Secondary | ICD-10-CM | POA: Insufficient documentation

## 2018-11-05 LAB — CBC
HEMATOCRIT: 41.5 % (ref 39.0–52.0)
Hemoglobin: 12.9 g/dL — ABNORMAL LOW (ref 13.0–17.0)
MCH: 27.6 pg (ref 26.0–34.0)
MCHC: 31.1 g/dL (ref 30.0–36.0)
MCV: 88.9 fL (ref 80.0–100.0)
PLATELETS: 295 10*3/uL (ref 150–400)
RBC: 4.67 MIL/uL (ref 4.22–5.81)
RDW: 12.8 % (ref 11.5–15.5)
WBC: 4.1 10*3/uL (ref 4.0–10.5)
nRBC: 0 % (ref 0.0–0.2)

## 2018-11-05 LAB — COMPREHENSIVE METABOLIC PANEL
ALBUMIN: 4.2 g/dL (ref 3.5–5.0)
ALT: 37 U/L (ref 0–44)
AST: 24 U/L (ref 15–41)
Alkaline Phosphatase: 42 U/L (ref 38–126)
Anion gap: 11 (ref 5–15)
BUN: 11 mg/dL (ref 6–20)
CALCIUM: 8.7 mg/dL — AB (ref 8.9–10.3)
CO2: 22 mmol/L (ref 22–32)
Chloride: 106 mmol/L (ref 98–111)
Creatinine, Ser: 1.07 mg/dL (ref 0.61–1.24)
GFR calc Af Amer: >60 mL/min (ref >60)
GFR calc non Af Amer: >60 mL/min (ref >60)
Glucose, Bld: 98 mg/dL (ref 70–99)
Potassium: 4.3 mmol/L (ref 3.5–5.1)
Sodium: 139 mmol/L (ref 135–145)
Total Bilirubin: 0.7 mg/dL (ref 0.3–1.2)
Total Protein: 7.3 g/dL (ref 6.5–8.1)

## 2018-11-05 LAB — RAPID URINE DRUG SCREEN, HOSP PERFORMED
Amphetamines: NOT DETECTED
BARBITURATES: NOT DETECTED
Benzodiazepines: NOT DETECTED
Cocaine: NOT DETECTED
Opiates: NOT DETECTED
Tetrahydrocannabinol: NOT DETECTED

## 2018-11-05 LAB — ETHANOL: Alcohol, Ethyl (B): <10 mg/dL (ref <10)

## 2018-11-05 NOTE — ED Notes (Signed)
Resting quielty 

## 2018-11-05 NOTE — ED Notes (Signed)
Pt ambulatory w/o difficulty out of the dept.

## 2018-11-05 NOTE — BH Assessment (Signed)
[facility] Assessment Progress Note   Case was staffed with Shaune PollackLord DNP who recommended patient be discharged and follow up with current provider.

## 2018-11-05 NOTE — ED Notes (Signed)
ED Provider at bedside. 

## 2018-11-05 NOTE — ED Provider Notes (Signed)
[facility]-EMERGENCY DEPT Provider Note   CSN: [id number] Arrival date & time: 11/05/18  0701     History   Chief Complaint Chief Complaint  Patient presents with  . Hallucinations    HPI Cole Barrett is a 32 y.o. male.  Patient is a 32 year old male with past medical history of paranoia, schizophrenia, and prior PE.  He presents today for evaluation of hearing voices.  He tells me that he has been hearing voices that are telling him they are going to kill him.  This is been worsening over the past several days.  He reports being compliant with his medications.  He denies any suicidal or homicidal ideation.  He denies any illicit drug use, but does report occasional alcohol use.  The history is provided by the patient.    Past Medical History:  Diagnosis Date  . Homelessness   . Hypercholesterolemia   . Mental disorder   . Paranoid behavior (HCC)   . PE (pulmonary embolism)   . Schizophrenia  [facility])     Patient Active Problem List   Diagnosis Date Noted  . 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 Alcohol abuse   . Auditory hallucinations   . Alcohol abuse with alcohol-induced mood disorder (HCC) 09/08/2016  . Homelessness 09/03/2016  . Person feigning illness 09/03/2016  . 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 Cannabis use disorder, mild, abuse 08/29/2016  . Pulmonary emboli (HCC) 07/13/2016  . 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 Acute left flank pain 01/02/2014  . Pulmonary embolism, bilateral (HCC) 01/02/2014    No past surgical history on file.      Home Medications    Prior to Admission medications   Medication Sig Start Date End Date Taking? Authorizing Provider  benztropine (COGENTIN) 1 MG tablet Take 0.5 tablets (0.5 mg total) by mouth 2 (two) times daily. 09/30/18   Gwyneth Sprout, MD  risperiDONE (RISPERDAL) 1 MG tablet Take 1 tablet (1 mg total)  by mouth 2 (two) times daily. 09/30/18   Gwyneth Sprout, MD    Family History Family History  Problem Relation Age of Onset  . Hypertension Mother     Social History Social History   Tobacco Use  . Smoking status: Current Every Day Smoker    Packs/day: 0.50    Types: Cigarettes  . Smokeless tobacco: Never Used  Substance Use Topics  . Alcohol use: Yes  . Drug use: Yes    Types: Marijuana    Comment: Last used:  10/02/18     Allergies   Haldol [haloperidol]   Review of Systems Review of Systems  All other systems reviewed and are negative.    Physical Exam Updated Vital Signs BP 138/81   Pulse 73   Temp 97.6 F (36.4 C) (Oral)   Resp 15   SpO2 98%   Physical Exam  Constitutional: He is oriented to person, place, and time. He appears well-developed and well-nourished. No distress.  HENT:  Head: Normocephalic and atraumatic.  Mouth/Throat: Oropharynx is clear and moist.  Neck: Normal range of motion. Neck supple.  Cardiovascular: Normal rate and regular rhythm. Exam reveals no friction rub.  No murmur heard. Pulmonary/Chest: Effort normal and breath sounds normal. No respiratory distress. He has no wheezes. He has no rales.  Abdominal: Soft. Bowel sounds are normal. He exhibits no distension. There is no tenderness.  Musculoskeletal: Normal range of motion. He exhibits no edema.  Neurological: He is  alert and oriented to person, place, and time. Coordination normal.  Skin: Skin is warm and dry. He is not diaphoretic.  Psychiatric: He has a normal mood and affect. His speech is normal and behavior is normal. His mood appears not anxious. Thought content is paranoid and delusional. Cognition and memory are normal. He expresses no homicidal and no suicidal ideation.  Nursing note and vitals reviewed.    ED Treatments / Results  Labs (all labs ordered are listed, but only abnormal results are displayed) Labs Reviewed  COMPREHENSIVE METABOLIC PANEL  ETHANOL   CBC  RAPID URINE DRUG SCREEN, HOSP PERFORMED    EKG None  Radiology No results found.  Procedures Procedures (including critical care time)  Medications Ordered in ED Medications - No data to display   Initial Impression / Assessment and Plan / ED Course  I have reviewed the triage vital signs and the nursing notes.  Pertinent labs & imaging results that were available during my care of the patient were reviewed by me and considered in my medical decision making (see chart for details).  Patient presents with complaints of auditory hallucinations.  He has a history of schizophrenia and is well-known to the psychiatry department.  He was evaluated by them who feel as though he is appropriate for discharge.  Final Clinical Impressions(s) / ED Diagnoses   Final diagnoses:  None    ED Discharge Orders    None       Geoffery Lyonselo, Leslee Haueter, MD 11/06/18 [id number]

## 2018-11-05 NOTE — [facility] Suicide Risk Assessment (Signed)
Suicide Risk Assessment  Discharge Assessment   [facility]   Principal Problem: Adjustment disorder with emotional disturbance Discharge Diagnoses: Principal Problem:   Adjustment disorder with emotional disturbance Active Problems:   Person feigning illness   Total Time spent with patient: 45 minutes  Musculoskeletal: Strength & Muscle Tone: within normal limits Gait & Station: normal Patient leans: N/A  Psychiatric Specialty Exam:   Blood pressure 135/85, pulse 66, temperature 97.9 F (36.6 C), temperature source Oral, resp. rate 16, SpO2 95 %.There is no height or weight on file to calculate BMI.  General Appearance: Casual  Eye Contact::  Good  Speech:  Normal Rate409  Volume:  Normal  Mood:  Depressed, mild  Affect:  Congruent  Thought Process:  Coherent and Descriptions of Associations: Intact  Orientation:  Full (Time, Place, and Person)  Thought Content:  WDL and Logical  Suicidal Thoughts:  No  Homicidal Thoughts:  No  Memory:  Immediate;   Good Recent;   Good Remote;   Good  Judgement:  Fair  Insight:  Good  Psychomotor Activity:  Normal  Concentration:  Good  Recall:  Good  Fund of Knowledge:Good  Language: Good  Akathisia:  No  Handed:  Right  AIMS (if indicated):     Assets:  Leisure Time Physical Health Resilience  Sleep:     Cognition: WNL  ADL's:  Intact   Mental Status Per Nursing Assessment::   On Admission:   32 yo male who presented to the ED with hallucinations.  He is well known to this ED for similar presentations when he needs a place to stay. Not currently responding to internal stimuli, no suicidal/homicidal ideations or recent substance abuse.  Encour[facility].  Demographic Factors:  Male  Loss Factors: NA  Historical Factors: NA  Risk Reduction Factors:   Sense of responsibility to family, Positive social support and Positive therapeutic relationship  Continued  Clinical Symptoms:  Depression, mild  Cognitive Features That Contribute To Risk:  None    Suicide Risk:  Minimal: No identifiable suicidal ideation.  Patients presenting with no risk factors but with morbid ruminations; may be classified as minimal risk based on the severity of the depressive symptoms    Plan Of Care/Follow-up recommendations:  Activity:  as tolerated Diet:  heart healthy diet  Clair Bardwell, NP 11/05/2018, 12:02 PM

## 2018-11-05 NOTE — ED Notes (Signed)
Bed: WA30 Expected date:  Expected time:  Means of arrival:  Comments: 

## 2018-11-05 NOTE — ED Triage Notes (Signed)
Found in hospital lobby, reported to the triage nurse that he had started hearing voices. Reports that the voices are actively telling him that they are going to kill him. Denies SI/HI or any physical complaints.

## 2018-11-05 NOTE — ED Notes (Signed)
Eating lunch 

## 2018-11-05 NOTE — ED Notes (Signed)
Called x 2. No answer 

## 2018-11-05 NOTE — BH Assessment (Signed)
[facility] Assessment Progress Note  Per Jacqueline Norman, DO, this pt does not require psychiatric hospitalization at this time.  Pt is to be discharged from [facility] with recommendation to continue treatment with [facility].  This has been included in pt's discharge instructions.  Pt's nurse has been notified.  Arzell Mcgeehan, MA Triage Specialist [phone]     

## 2018-11-05 NOTE — ED Notes (Signed)
Pt in the bathroom  

## 2018-11-05 NOTE — ED Notes (Signed)
Written dc instructions  Reviewed w/ pt, pt denies si/hi at this time.  [facility], belongings returned.

## 2018-11-05 NOTE — ED Notes (Signed)
Pt called for triage. No answer.

## 2018-11-05 NOTE — BH Assessment (Signed)
Assessment Note  Cole HawkingChristopher Adam Barrett is an 32 y.o. male that presents this date with AH. Patient denies any S/I, H/I or VH. Patient reports [facility] with [facility] management for symptoms associated with   Schizophrenia. Patient states he last saw that provider "two weeks ago" and reports current medication compliance. Patient states he resides alone and finds it difficult to "deal with the voices" that have increased in the last 48 hours telling him to harm himself. Patient is vague in reference to content of AH and cannot identify any current stressors associated with increased AH. Patient per chart review, has a history of SA use although denies currently using any illicit substances. Patient's UDS is pending. Patient was alert and oriented x 4. He was dressed in scrubs and mood is pleasant with affect congruent. He did not appear to be responding to internal stimuli at this time. His insight, judgement, and impulse control are fair. Patient has a extensive history of ED visits and [facility] when he presented with increased AH at that time also. Per notes, patient has a  past medical history of paranoia and schizophrenia. He presents today for evaluation of hearing voices. Patient informed staff on admission that  he has been hearing voices that are telling him they are going to kill him. This is been worsening over the past several days. He reports being compliant with his medications. He denies any suicidal or homicidal ideation. He denies any illicit drug use, but does report occasional alcohol use. Case was staffed with Shaune PollackLord DNP who recommended patient be discharged and follow up with current provider.    Diagnosis: F20.9 Schizophrenia (per notes)   Past Medical History:  Past Medical History:  Diagnosis Date  . Homelessness   . Hypercholesterolemia   . Mental disorder   . Paranoid behavior (HCC)   . PE  (pulmonary embolism)   . Schizophrenia (HCC)     No past surgical history on file.  Family History:  Family History  Problem Relation Age of Onset  . Hypertension Mother     Social History:  reports that he has been smoking cigarettes. He has been smoking about 0.50 packs per day. He has never used smokeless tobacco. He reports that he drinks alcohol. He reports that he has current or past drug history. Drug: Marijuana.  Additional Social History:  Alcohol / Drug Use Pain Medications: see MAR Prescriptions: see MAR Over the Counter: see MAR History of alcohol / drug use?: Yes Longest period of sobriety (when/how long): patient denies Negative Consequences of Use: (Denies) Withdrawal Symptoms: (Denies) Substance #1 Name of Substance 1: Marijuana hx per chart review  1 - Age of First Use: teens 1 - Amount (size/oz): Unknown 1 - Frequency: Unknown 1 - Duration: Denies current use 1 - Last Use / Amount: 10/02/18 UDS pending  CIWA: CIWA-Ar BP: 138/81 Pulse Rate: 73 COWS:    Allergies:  Allergies  Allergen Reactions  . Haldol [Haloperidol] Shortness Of Breath    Home Medications:  (Not in a hospital admission)  OB/GYN Status:  No LMP for male patient.  General Assessment Data Location of Assessment: [facility] TTS Assessment: In system Is this a Tele or Face-to-Face Assessment?: Face-to-Face Is this an Initial Assessment or a Re-assessment for this encounter?: Initial Assessment Patient Accompanied by:: N/A Language Other than English: No Living Arrangements: (Alone) What gender do you identify as?: Male Marital status: Single  Maiden name: (NA) Pregnancy Status: No Living Arrangements: Alone Can pt return to current living arrangement?: Yes Admission Status: Voluntary Is patient capable of signing voluntary admission?: Yes Referral Source: Self/Family/Friend Insurance type: Self Pay     Crisis Care Plan Living Arrangements: Alone Legal Guardian: (NA) Name of  Psychiatrist: [facility] Name of Therapist: None  Education Status Is patient currently in school?: No Is the patient employed, unemployed or receiving disability?: Unemployed  Risk to self with the past 6 months Suicidal Ideation: No Has patient been a risk to self within the past 6 months prior to admission? : No Suicidal Intent: No Has patient had any suicidal intent within the past 6 months prior to admission? : No Is patient at risk for suicide?: No, but patient needs Medical Clearance Suicidal Plan?: No Has patient had any suicidal plan within the past 6 months prior to admission? : No Access to Means: No What has been your use of drugs/alcohol within the last 12 months?: NA Previous Attempts/Gestures: No How many times?: 0 Other Self Harm Risks: None Triggers for Past Attempts: Unknown Intentional Self Injurious Behavior: None Family Suicide History: No Recent stressful life event(s): Other (Comment)(Increased AH) Persecutory voices/beliefs?: Yes Depression: No Depression Symptoms: (Denies) Substance abuse history and/or treatment for substance abuse?: Yes Suicide prevention information given to non-admitted patients: Not applicable  Risk to Others within the past 6 months Homicidal Ideation: No Does patient have any lifetime risk of violence toward others beyond the six months prior to admission? : No Thoughts of Harm to Others: No Current Homicidal Intent: No Current Homicidal Plan: No Access to Homicidal Means: No Identified Victim: NA History of harm to others?: No Assessment of Violence: None Noted Violent Behavior Description: NA Does patient have access to weapons?: No Criminal Charges Pending?: No Does patient have a court date: No Is patient on probation?: No  Psychosis Hallucinations: Auditory Delusions: None noted  Mental Status Report Appearance/Hygiene: In scrubs Eye Contact: Fair Motor Activity: Freedom of movement Speech:  Logical/coherent Level of Consciousness: Quiet/awake Mood: Pleasant Affect: Angry Anxiety Level: Moderate Thought Processes: Coherent, Relevant Judgement: Partial Orientation: Person, Place, Time Obsessive Compulsive Thoughts/Behaviors: None  Cognitive Functioning Concentration: Good Memory: Recent Intact, Remote Intact Is patient IDD: No Insight: Fair Impulse Control: Fair Appetite: Good Have you had any weight changes? : No Change Sleep: No Change Total Hours of Sleep: 7 Vegetative Symptoms: None  ADLScreening [facility]) Patient's cognitive ability adequate to safely complete daily activities?: Yes Patient able to express need for assistance with ADLs?: Yes Independently performs ADLs?: Yes (appropriate for developmental age)  Prior Inpatient Therapy Prior Inpatient Therapy: Yes Prior Therapy Dates: Multiple Prior Therapy Facilty/Provider(s): [facility] Reason for Treatment: MH issues  Prior Outpatient Therapy Prior Outpatient Therapy: Yes Prior Therapy Dates: Ongoing Prior Therapy Facilty/Provider(s): [facility] Reason for Treatment: Med mang Does patient have an ACCT team?: No Does patient have Intensive In-House Services?  : No Does patient have [facility] services? : Yes Does patient have P4CC services?: No  ADL Screening (condition at time of admission) Patient's cognitive ability adequate to safely complete daily activities?: Yes Is the patient deaf or have difficulty hearing?: No Does the patient have difficulty seeing, even when wearing glasses/contacts?: No Does the patient have difficulty concentrating, remembering, or making decisions?: No Patient able to express need for assistance with ADLs?: Yes Does the patient have difficulty dressing or bathing?: No Independently performs ADLs?: Yes (appropriate for developmental age) Does the patient have difficulty walking  or climbing stairs?: No Weakness of Legs: None Weakness of Arms/Hands:  None  Home Assistive Devices/Equipment Home Assistive Devices/Equipment: None  Therapy Consults (therapy consults require a physician order) PT Evaluation Needed: No OT Evalulation Needed: No SLP Evaluation Needed: No Abuse/Neglect Assessment (Assessment to be complete while patient is alone) Physical Abuse: Denies Verbal Abuse: Denies Sexual Abuse: Denies Exploitation of patient/patient's resources: Denies Values / Beliefs Cultural Requests During Hospitalization: None Spiritual Requests During Hospitalization: None Consults Spiritual Care Consult Needed: No Social Work Consult Needed: No Merchant [employer] officer (For Healthcare) Does Patient Have a Medical Advance Directive?: No Would patient like information on creating a medical advance directive?: No - Patient declined          Disposition: Case was staffed with Shaune Pollack DNP who recommended patient be discharged and follow up with current provider Disposition Initial Assessment Completed for this Encounter: Yes Disposition of Patient: Discharge Patient refused recommended treatment: No Mode of transportation if patient is discharged/movement?: (Unk) Patient referred to: Outpatient clinic referral  On Site Evaluation by:   Reviewed with Physician:    Alfredia Ferguson 11/05/2018 11:03 AM

## 2018-11-05 NOTE — Discharge Instructions (Signed)
For your mental health needs, you are advised to continue treatment with [facility]: ° °     [facility] °     [address] °     [place], [place] [address] °     [phone]

## 2018-11-07 ENCOUNTER — Encounter (HOSPITAL_COMMUNITY): Payer: Self-pay | Admitting: Emergency Medicine

## 2018-11-07 ENCOUNTER — Other Ambulatory Visit: Payer: Self-pay

## 2018-11-07 ENCOUNTER — Emergency Department (HOSPITAL_COMMUNITY)
Admission: EM | Admit: 2018-11-07 | Discharge: 2018-11-07 | Disposition: A | Discharge status: Home / Self Care | Payer: Self-pay | Attending: Emergency Medicine | Admitting: Emergency Medicine

## 2018-11-07 DIAGNOSIS — R440 Auditory hallucinations: Secondary | ICD-10-CM | POA: Insufficient documentation

## 2018-11-07 DIAGNOSIS — Z5321 Procedure and treatment not carried out due to patient leaving prior to being seen by health care provider: Secondary | ICD-10-CM | POA: Insufficient documentation

## 2018-11-07 DIAGNOSIS — F209 Schizophrenia, unspecified: Secondary | ICD-10-CM | POA: Insufficient documentation

## 2018-11-07 LAB — CBC
HCT: 40.3 % (ref 39.0–52.0)
Hemoglobin: 12.5 g/dL — ABNORMAL LOW (ref 13.0–17.0)
MCH: 27.1 pg (ref 26.0–34.0)
MCHC: 31 g/dL (ref 30.0–36.0)
MCV: 87.4 fL (ref 80.0–100.0)
Platelets: 325 10*3/uL (ref 150–400)
RBC: 4.61 MIL/uL (ref 4.22–5.81)
RDW: 12.7 % (ref 11.5–15.5)
WBC: 5.1 10*3/uL (ref 4.0–10.5)
nRBC: 0 % (ref 0.0–0.2)

## 2018-11-07 LAB — SALICYLATE LEVEL: Salicylate Lvl: <7 mg/dL (ref 2.8–30.0)

## 2018-11-07 LAB — COMPREHENSIVE METABOLIC PANEL
ALT: 35 U/L (ref 0–44)
AST: 24 U/L (ref 15–41)
Albumin: 3.8 g/dL (ref 3.5–5.0)
Alkaline Phosphatase: 38 U/L (ref 38–126)
Anion gap: 11 (ref 5–15)
BILIRUBIN TOTAL: 0.7 mg/dL (ref 0.3–1.2)
BUN: 11 mg/dL (ref 6–20)
CO2: 22 mmol/L (ref 22–32)
CREATININE: 1.18 mg/dL (ref 0.61–1.24)
Calcium: 8.6 mg/dL — ABNORMAL LOW (ref 8.9–10.3)
Chloride: 104 mmol/L (ref 98–111)
GFR calc Af Amer: >60 mL/min (ref >60)
GFR calc non Af Amer: >60 mL/min (ref >60)
Glucose, Bld: 100 mg/dL — ABNORMAL HIGH (ref 70–99)
Potassium: 3.7 mmol/L (ref 3.5–5.1)
Sodium: 137 mmol/L (ref 135–145)
Total Protein: 7 g/dL (ref 6.5–8.1)

## 2018-11-07 LAB — ETHANOL: Alcohol, Ethyl (B): 49 mg/dL — ABNORMAL HIGH (ref <10)

## 2018-11-07 LAB — ACETAMINOPHEN LEVEL: Acetaminophen (Tylenol), Serum: <10 ug/mL — ABNORMAL LOW (ref 10–30)

## 2018-11-07 NOTE — BH Assessment (Addendum)
Tele Assessment Note   Patient Name: Cole Barrett MRN: [id number] Referring Physician: Dr. Derwood Kaplan, MD Location of Patient: [facility] Location of Provider: Behavioral Health TTS Department  Cole Barrett is a 32 y.o. male who came to [facility] ED due to experiencing ongoing AH that are telling him that they are going to kill him. Pt shares that he has been experiencing these AH for two years and that he hears 1-2 voices telling him this. Pt shares that he currently works at [name] in [place], that he is single and lives independently in a home, and that he attends [facility] for psychiatric services. Pt was lethargic throughout the assessment process and required clinician saying his name several times to waken him to complete the process.  Pt denies SI, HI, VH, and NSSIB. He denies any SI attempts, a plan, intent, and states that the last time he was hospitalized was at [facility] in 2018. Pt denies SA and any involvement in the legal system. He denies any acces to weapons. Pt declined any family history of SI, MH, or SA or any abuse inflicted onto him in the past or currently. He shares his appetite is good and that he has been sleeping approximately 8 hours per night. He shares he is able to independently complete his ADLs.  Pt is oriented x4. His recent and remote memory are intact. Pt was polite and cooperative throughout the assessment process; he expressed a desire to be admitted to [facility] and expressed an understanding that clinician does not make that decision. Pt's insight, judgement, and impulse control is fair at this time.   Diagnosis: F20.9, Schizophrenia  Past Medical History:  Past Medical History:  Diagnosis Date  . Homelessness   . Hypercholesterolemia   . Mental disorder   . Paranoid behavior (HCC)   . PE (pulmonary embolism)   . Schizophrenia (HCC)     History reviewed. No pertinent surgical history.  Family  History:  Family History  Problem Relation Age of Onset  . Hypertension Mother     Social History:  reports that he has been smoking cigarettes. He has been smoking about 0.50 packs per day. He has never used smokeless tobacco. He reports that he drinks alcohol. He reports that he has current or past drug history. Drug: Marijuana.  Additional Social History:  Alcohol / Drug Use Pain Medications: Please see MAR Prescriptions: Please see MAR Over the Counter: Please see MAR History of alcohol / drug use?: No history of alcohol / drug abuse Longest period of sobriety (when/how long): Pt denies  CIWA: CIWA-Ar BP: 137/76 Pulse Rate: 65 COWS:    Allergies:  Allergies  Allergen Reactions  . Haldol [Haloperidol] Shortness Of Breath    Home Medications:  (Not in a hospital admission)  OB/GYN Status:  No LMP for male patient.  General Assessment Data Location of Assessment: [facility] ED TTS Assessment: In system Is this a Tele or Face-to-Face Assessment?: Tele Assessment Is this an Initial Assessment or a Re-assessment for this encounter?: Initial Assessment Patient Accompanied by:: N/A Language Other than English: No Living Arrangements: Other (Comment)(Pt states he lives independently in a house) What gender do you identify as?: Male Marital status: Single Maiden name: Pavao Pregnancy Status: No Living Arrangements: Alone Can pt return to current living arrangement?: Yes Admission Status: Voluntary Is patient capable of signing voluntary admission?: Yes Referral Source: Self/Family/Friend Insurance type: None     Crisis Care Plan Living  Arrangements: Alone Legal Guardian: (N/A) Name of Psychiatrist: [facility] - cannot remember name of practitioner Name of Therapist: None  Education Status Is patient currently in school?: No Is the patient employed, unemployed or receiving disability?: Employed  Risk to self with the past 6 months Suicidal Ideation: No Has patient been  a risk to self within the past 6 months prior to admission? : No Suicidal Intent: No Has patient had any suicidal intent within the past 6 months prior to admission? : No Is patient at risk for suicide?: No Suicidal Plan?: No Has patient had any suicidal plan within the past 6 months prior to admission? : No Access to Means: No What has been your use of drugs/alcohol within the last 12 months?: Pt denies Previous Attempts/Gestures: No How many times?: 0 Other Self Harm Risks: None noted Triggers for Past Attempts: None known Intentional Self Injurious Behavior: None Family Suicide History: No Recent stressful life event(s): Turmoil (Comment)(Pt hears voices telling him to kill himself) Persecutory voices/beliefs?: Yes Depression: No Depression Symptoms: Despondent, Fatigue Substance abuse history and/or treatment for substance abuse?: No Suicide prevention information given to non-admitted patients: Not applicable  Risk to Others within the past 6 months Homicidal Ideation: No Does patient have any lifetime risk of violence toward others beyond the six months prior to admission? : No Thoughts of Harm to Others: No Current Homicidal Intent: No Current Homicidal Plan: No Access to Homicidal Means: No Identified Victim: None noted History of harm to others?: No Assessment of Violence: On admission Violent Behavior Description: None noted Does patient have access to weapons?: No(Pt denies) Criminal Charges Pending?: No Does patient have a court date: No Is patient on probation?: No  Psychosis Hallucinations: Auditory Delusions: None noted  Mental Status Report Appearance/Hygiene: In scrubs Eye Contact: Poor Motor Activity: Shuffling, Other (Comment)(Pt is sitting in a chair in Triage) Speech: Logical/coherent Level of Consciousness: Quiet/awake, Sedated, Other (Comment)(Pt is nodding off during assessment) Mood: Pleasant, Other (Comment)(Polite) Affect: Appropriate to  circumstance Anxiety Level: Minimal Thought Processes: Coherent, Relevant Judgement: Partial Orientation: Person, Place, Time, Situation Obsessive Compulsive Thoughts/Behaviors: Minimal  Cognitive Functioning Concentration: Fair Memory: Recent Intact, Remote Intact Is patient IDD: No Insight: Fair Impulse Control: Fair Appetite: Good Have you had any weight changes? : No Change Sleep: No Change Total Hours of Sleep: 8 Vegetative Symptoms: None  ADLScreening [facility] Assessment Services) Patient's cognitive ability adequate to safely complete daily activities?: Yes Patient able to express need for assistance with ADLs?: Yes Independently performs ADLs?: Yes (appropriate for developmental age)  Prior Inpatient Therapy Prior Inpatient Therapy: Yes Prior Therapy Dates: Multiple [facility] Reason for Treatment: MH  Prior Outpatient Therapy Prior Outpatient Therapy: Yes Prior Therapy Dates: Ongoing Prior Therapy Facilty/Provider(s): [facility] Reason for Treatment: MH, Medication Management Does patient have an ACCT team?: No Does patient have Intensive In-House Services?  : No Does patient have [facility] services? : Yes Does patient have P4CC services?: No  ADL Screening (condition at time of admission) Patient's cognitive ability adequate to safely complete daily activities?: Yes Is the patient deaf or have difficulty hearing?: No Does the patient have difficulty seeing, even when wearing glasses/contacts?: No Does the patient have difficulty concentrating, remembering, or making decisions?: No Patient able to express need for assistance with ADLs?: Yes Does the patient have difficulty dressing or bathing?: No Independently performs ADLs?: Yes (appropriate for developmental age) Does the patient have difficulty walking or climbing stairs?: No Weakness of Legs:  None Weakness of Arms/Hands: None     Therapy Consults (therapy  consults require a physician order) PT Evaluation Needed: No OT Evalulation Needed: No SLP Evaluation Needed: No Abuse/Neglect Assessment (Assessment to be complete while patient is alone) Abuse/Neglect Assessment Can Be Completed: Yes Physical Abuse: Denies Verbal Abuse: Denies Sexual Abuse: Denies Exploitation of patient/patient's resources: Denies Self-Neglect: Denies Values / Beliefs Cultural Requests During Hospitalization: None Spiritual Requests During Hospitalization: None Consults Spiritual Care Consult Needed: No Social Work Consult Needed: No Merchant navy officer (For Healthcare) Does Patient Have a Medical Advance Directive?: No Would patient like information on creating a medical advance directive?: No - Patient declined       Disposition: [name] NP reviewed pt's chart and information and determined pt does not meet criteria for inpatient hospitalization. It is recommended that pt f/u with [facility] for ongoing medication management needs. This information was provided to pt's nurse, Caitlynn RN, at [phone].  Disposition Initial Assessment Completed for this Encounter: Yes Patient referred to: Outpatient clinic referral, Other (Comment)(Pt should follow-up with [facility] for ongoing Med Management)  This service was provided via telemedicine using a 2-way, interactive audio and video technology.  Names of all persons participating in this telemedicine service and their role in this encounter. Name: Loretha Brasil Role: Patient  Name: Duard Brady Role: Clinician    Ralph Dowdy 11/07/2018 6:15 AM

## 2018-11-07 NOTE — ED Notes (Signed)
TTS at bedside. 

## 2018-11-07 NOTE — ED Notes (Signed)
Pt not found in room. Pt not seen by EDP.

## 2018-11-07 NOTE — ED Triage Notes (Signed)
Pt reports he is hearing voices that are saying they are going to kill him. Pt denies SI/HI and pain. Hx of schizophrenia.

## 2018-11-07 NOTE — ED Provider Notes (Signed)
0700: Labs and VS reviewed and unremarkable. [facility] has evaluated pt and deemed psych cleared. I went to evaluate pt. He was not in the room.  Returned to room a few minutes later and pt was not there.  I did not evaluate patient or do a MSE.  Charge RN [employer] notified.    Liberty HandyGibbons, Claudia J, PA-C 11/07/18 [id number]    Derwood KaplanNanavati, Ankit, MD 11/08/18 [phone]

## 2018-11-07 NOTE — ED Notes (Signed)
Pt changed into purple scrubs. Security notifed for wanding. Staffing notifed for sitter. Pt's belongings to be placed in Purple zone lockers, not inventoried at this time.

## 2018-11-10 ENCOUNTER — Emergency Department (HOSPITAL_COMMUNITY)
Admission: EM | Admit: 2018-11-10 | Discharge: 2018-11-10 | Disposition: A | Discharge status: Home / Self Care | Payer: Self-pay | Attending: Emergency Medicine | Admitting: Emergency Medicine

## 2018-11-10 ENCOUNTER — Other Ambulatory Visit: Payer: Self-pay

## 2018-11-10 ENCOUNTER — Encounter (HOSPITAL_COMMUNITY): Payer: Self-pay

## 2018-11-10 DIAGNOSIS — E7800 Pure hypercholesterolemia, unspecified: Secondary | ICD-10-CM | POA: Insufficient documentation

## 2018-11-10 DIAGNOSIS — Z86718 Personal history of other venous thrombosis and embolism: Secondary | ICD-10-CM | POA: Insufficient documentation

## 2018-11-10 DIAGNOSIS — F209 Schizophrenia, unspecified: Secondary | ICD-10-CM | POA: Insufficient documentation

## 2018-11-10 DIAGNOSIS — R440 Auditory hallucinations: Secondary | ICD-10-CM | POA: Insufficient documentation

## 2018-11-10 DIAGNOSIS — Z79899 Other long term (current) drug therapy: Secondary | ICD-10-CM | POA: Insufficient documentation

## 2018-11-10 DIAGNOSIS — F17210 Nicotine dependence, cigarettes, uncomplicated: Secondary | ICD-10-CM | POA: Insufficient documentation

## 2018-11-10 NOTE — ED Notes (Signed)
TTS clinician received call from patient RN, patient is not in a room at this time. RN will call when patient is not in the hallway and in a room so TTS clinician can complete assessment.

## 2018-11-10 NOTE — ED Provider Notes (Signed)
[facility]-EMERGENCY DEPT Provider Note   CSN: [id number] Arrival date & time: 11/10/18  [id number]     History   Chief Complaint Chief Complaint  Patient presents with  . Psychiatric Evaluation    HPI Cole Barrett is a 32 y.o. male.  HPI Pt reports ongoing auditory hallucinations with voices stating they are going to hill him. Reports no HI or SI. States compliance with meds. [facility] but was told they have no beds thus was brought to ER for evaluation. He presents with a large suitcase filled with his clothes. No other complaints. [facility]    Past Medical History:  Diagnosis Date  . Homelessness   . Hypercholesterolemia   . Mental disorder   . Paranoid behavior (HCC)   . PE (pulmonary embolism)   . Schizophrenia [facility](HCC)     Patient Active Problem List   Diagnosis Date Noted  . 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 Alcohol abuse   . Auditory hallucinations   . Alcohol abuse with alcohol-induced mood disorder (HCC) 09/08/2016  . Homelessness 09/03/2016  . Person feigning illness 09/03/2016  . 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 Cannabis use disorder, mild, abuse 08/29/2016  . Pulmonary emboli (HCC) 07/13/2016  . Adjustment disorder with emotional disturbance 03/12/2014  . 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 Acute left flank pain 01/02/2014  . Pulmonary embolism, bilateral (HCC) 01/02/2014    History reviewed. No pertinent surgical history.      Home Medications    Prior to Admission medications   Medication Sig Start Date End Date Taking? Authorizing Provider  benztropine (COGENTIN) 1 MG tablet Take 0.5 tablets (0.5 mg total) by mouth 2 (two) times daily. 09/30/18   Gwyneth SproutPlunkett, Whitney, MD  risperiDONE (RISPERDAL) 1 MG tablet Take 1 tablet (1 mg total) by mouth 2 (two) times daily. 09/30/18   Gwyneth SproutPlunkett, Whitney, MD     Family History Family History  Problem Relation Age of Onset  . Hypertension Mother     Social History Social History   Tobacco Use  . Smoking status: Current Every Day Smoker    Packs/day: 0.50    Types: Cigarettes  . Smokeless tobacco: Never Used  Substance Use Topics  . Alcohol use: Yes  . Drug use: Yes    Types: Marijuana    Comment: Last used:  10/02/18     Allergies   Haldol [haloperidol]   Review of Systems Review of Systems  All other systems reviewed and are negative.    Physical Exam Updated Vital Signs BP 128/81 (BP Location: Left Arm)   Pulse 72   Temp 97.8 F (36.6 C) (Oral)   Resp 18   Ht 5\' 10"  (1.778 m)   Wt 102.1 kg   SpO2 97%   BMI 32.28 kg/m   Physical Exam Vitals signs and nursing note reviewed.  Constitutional:      Appearance: He is well-developed.  HENT:     Head: Normocephalic and atraumatic.  Neck:     Musculoskeletal: Normal range of motion.  Cardiovascular:     Rate and Rhythm: Normal rate and regular rhythm.     Heart sounds: Normal heart sounds.  Pulmonary:     Effort: Pulmonary effort is normal. No respiratory distress.     Breath sounds: Normal breath sounds.  Abdominal:     General: There is no distension.     Palpations: Abdomen is soft.  Tenderness: There is no abdominal tenderness.  Musculoskeletal: Normal range of motion.  Skin:    General: Skin is warm and dry.  Neurological:     Mental Status: He is alert and oriented to person, place, and time.  Psychiatric:        Mood and Affect: Mood normal.        Behavior: Behavior normal.        Thought Content: Thought content normal.        Judgment: Judgment normal.      ED Treatments / Results  Labs (all labs ordered are listed, but only abnormal results are displayed) Labs Reviewed - No data to display  EKG None  Radiology No results found.  Procedures Procedures (including critical care time)  Medications Ordered in ED Medications -  No data to display   Initial Impression / Assessment and Plan / ED Course  I have reviewed the triage vital signs and the nursing notes.  Pertinent labs & imaging results that were available during my care of the patient were reviewed by me and considered in my medical decision making (see chart for details).     Prior chart reviewed. Multiple visits for same complaints. I don't believe the pt is threat to himself or others at this time.   Final Clinical Impressions(s) / ED Diagnoses   Final diagnoses:  None    ED Discharge Orders    None       Azalia Bilis, MD 11/10/18 [phone]

## 2018-11-10 NOTE — ED Triage Notes (Signed)
Patient reports that he is hearing voices that tell him that they are going to kill him. Patient denies any suicide thoughts. patient denies any HI, visual hallucinations. Patient states last alcohol was last night/2 40 ounce beers. Patient denies any drug use. Patient accompanied by [facility]. Patient called [employer] and stated he was suicidal. [facility] was told they did not have any beds at this time.

## 2018-11-10 NOTE — BH Assessment (Signed)
Tele Assessment Note   Patient Name: Cole Barrett MRN: [id number] Referring Physician: Dr. Blane OharaJoshua Barrett Location of Patient: [facility] Location of Provider: Behavioral Health TTS Department  Cole Barrett is an 32 y.o. male. with history of schizophrenia, presents for evaluation of worsening auditory hallucinations. Cole DeerChristopher has been to the ED 22 times in 2019 for hallucinations. Patient reported compliance with medication. [facility] and was told to come to the ED if the voices got louder. Patient reported the voices got louder saying "the voices said they are going to kill me". Patient denied command voices. Patient denies visual hallucinations. Patient states he has never had any thoughts of killing himself. Patient denied SI, HI and drug usage. Patient reported only needing medication adjustment. Patient was cooperative during assessment, repetitively stating "[facility] because these voices are getting worse, they are saying they are going to me".     Pt denies SI, HI, VH, and NSSIB. He denies any SI attempts, a plan, intent, and states that the [facility]. Pt denies SA and any involvement in the legal system. He denies any acces to weapons. Pt declined any family history of SI, MH, or SA or any abuse inflicted onto him in the past or currently. He shares his appetite is good and that he has been sleeping approximately 8 hours per night. He shares he is able to independently complete his ADLs.  Patient is oriented x4. His recent and remote memory are intact. Patient was polite and cooperative throughout the assessment process; he [facility] and expressed an understanding that clinician does not make that decision. Patient was calm and cooperative during assessment.   UDS none current ETOH none current  Diagnosis: F20.9,  Schizophrenia  Past Medical History:  Past Medical History:  Diagnosis Date  . Homelessness   . Hypercholesterolemia   . Mental disorder   . Paranoid behavior (HCC)   . PE (pulmonary embolism)   . Schizophrenia (HCC)     No past surgical history on file.  Family History:  Family History  Problem Relation Age of Onset  . Hypertension Mother     Social History:  reports that he has been smoking cigarettes. He has been smoking about 0.50 packs per day. He has never used smokeless tobacco. He reports current alcohol use. He reports current drug use. Drug: Marijuana.  Additional Social History:  Alcohol / Drug Use Pain Medications: see MAR Prescriptions: see MAR Over the Counter: see MAR  CIWA: CIWA-Ar BP: 124/87 Pulse Rate: 95 COWS:    Allergies:  Allergies  Allergen Reactions  . Haldol [Haloperidol] Shortness Of Breath    Home Medications: (Not in a hospital admission)   OB/GYN Status:  No LMP for male patient.  General Assessment Data [facility] Assessment: In system Is this a Tele or Face-to-Face Assessment?: Tele Assessment Is this an Initial Assessment or a Re-assessment for this encounter?: Initial Assessment Patient Accompanied by:: N/A Language Other than English: No Living Arrangements: Other (Comment) What gender do you identify as?: Male Marital status: Single Pregnancy Status: No Living Arrangements: Alone Can pt return to current living arrangement?: Yes Admission Status: Voluntary Is patient capable of signing voluntary admission?: Yes Referral Source: Self/Family/Friend     Crisis Care Plan Living Arrangements: Alone Legal Guardian: Other:(self) Name of  Psychiatrist: [name] - cannot remember name of practitioner Name of Therapist: None  Education Status Is patient currently in school?: No Is the patient employed, unemployed or receiving disability?: Unemployed  Risk to self with the past 6 months Suicidal  Ideation: No Has patient been a risk to self within the past 6 months prior to admission? : Yes Suicidal Intent: No Has patient had any suicidal intent within the past 6 months prior to admission? : No Is patient at risk for suicide?: No Suicidal Plan?: No Has patient had any suicidal plan within the past 6 months prior to admission? : No Access to Means: No What has been your use of drugs/alcohol within the last 12 months?: (n/a) Previous Attempts/Gestures: No How many times?: (0) Other Self Harm Risks: (n/a) Triggers for Past Attempts: None known Intentional Self Injurious Behavior: None Family Suicide History: No Recent stressful life event(s): (voices) Persecutory voices/beliefs?: No Depression: No Depression Symptoms: (denied) Substance abuse history and/or treatment for substance abuse?: No  Risk to Others within the past 6 months Homicidal Ideation: No Does patient have any lifetime risk of violence toward others beyond the six months prior to admission? : No Thoughts of Harm to Others: No Current Homicidal Intent: No Current Homicidal Plan: No Access to Homicidal Means: No Identified Victim: (n/a) History of harm to others?: No Assessment of Violence: None Noted Violent Behavior Description: (n/a) Does patient have access to weapons?: No Criminal Charges Pending?: No Does patient have a court date: No Is patient on probation?: No  Psychosis Hallucinations: Auditory Delusions: None noted  Mental Status Report Appearance/Hygiene: Unremarkable Eye Contact: Fair Motor Activity: Freedom of movement Speech: Logical/coherent Level of Consciousness: Alert Mood: Anxious Affect: Appropriate to circumstance Anxiety Level: Minimal Thought Processes: Coherent, Relevant Judgement: Unimpaired Orientation: Person, Place, Time, Situation Obsessive Compulsive Thoughts/Behaviors: None  Cognitive Functioning Concentration: Normal Memory: Recent Intact, Remote Intact Is  patient IDD: No Insight: Fair Impulse Control: Fair Appetite: Fair Have you had any weight changes? : No Change Sleep: No Change Total Hours of Sleep: (8) Vegetative Symptoms: None  ADLScreening [facility] Assessment Services) Patient's cognitive ability adequate to safely complete daily activities?: Yes Patient able to express need for assistance with ADLs?: Yes Independently performs ADLs?: Yes (appropriate for developmental age)  Prior Inpatient Therapy Prior Inpatient Therapy: Yes Prior Therapy Dates: Multiple Prior Therapy [facility]ilty/Provider([facility] Reason for Treatment: MH  Prior Outpatient Therapy Prior Outpatient Therapy: Yes Prior Therapy Dates: Ongoing([facility]) Prior Therapy Facilty/Provider(s): [facility] Reason for Treatment: MH, Medication Management Does patient have an ACCT team?: No Does patient have Intensive In-House Services?  : No Does patient have [facility] services? : No Does patient have P4CC services?: No  ADL Screening (condition at time of admission) Patient's cognitive ability adequate to safely complete daily activities?: Yes Patient able to express need for assistance with ADLs?: Yes Independently performs ADLs?: Yes (appropriate for developmental age)             Merchant navy officer (For Healthcare) Does Patient Have a Medical Advance Directive?: No Would patient like information on creating a medical advance directive?: No - Patient declined   Disposition:  Disposition Initial Assessment Completed for this Encounter: Yes Patient referred to: Outpatient clinic referral, Other (Comment)  [name], NP, patient does not meet inpatient criteria. Patient referred to follow up with [facility] for outpatient services. Crystal, RN, informed of disposition.   This service was provided via telemedicine using a 2-way, interactive audio and video technology.  Names  of all persons participating in this telemedicine service and their role  in this encounter. Name: Cole Barrett Role: patient  Name: Al Corpus, [facility] Role: TTS Clinician  Name:  Role:   Name:  Role:     Burnetta Sabin, [facility] 11/10/2018 10:16 PM

## 2018-11-10 NOTE — ED Notes (Signed)
Nira ConnJason Berry, NP, patient does not meet inpatient criteria. Patient referred to follow up with [facility]. Crystal, RN, informed of disposition.

## 2018-11-10 NOTE — ED Provider Notes (Signed)
+ [name] EMERGENCY DEPARTMENT Provider Note   CSN: [id number] Arrival date & time: 11/10/18  7425     History   Chief Complaint Chief Complaint  Patient presents with  . Psychiatric Evaluation    HPI Cole Barrett is a 32 y.o. male.  Patient with history of paranoid behavior, mental disorder, schizophrenia, homeless, recent visits to the emergency room presents with worsening auditory hallucinations.  Patient says he would like help to decrease the voices.  Patient denies any suicidal or homicidal ideation.  Patient denies any other medical symptoms.     Past Medical History:  Diagnosis Date  . Homelessness   . Hypercholesterolemia   . Mental disorder   . Paranoid behavior (HCC)   . PE (pulmonary embolism)   . Schizophrenia [facility])     Patient Active Problem List   Diagnosis Date Noted  . 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 Alcohol abuse   . Auditory hallucinations   . Alcohol abuse with alcohol-induced mood disorder (HCC) 09/08/2016  . Homelessness 09/03/2016  . Person feigning illness 09/03/2016  . 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 Cannabis use disorder, mild, abuse 08/29/2016  . Pulmonary emboli (HCC) 07/13/2016  . Adjustment disorder with emotional disturbance 03/12/2014  . 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 Acute left flank pain 01/02/2014  . Pulmonary embolism, bilateral (HCC) 01/02/2014    No past surgical history on file.      Home Medications    Prior to Admission medications   Medication Sig Start Date End Date Taking? Authorizing Provider  benztropine (COGENTIN) 1 MG tablet Take 0.5 tablets (0.5 mg total) by mouth 2 (two) times daily. 09/30/18   Gwyneth Sprout, MD  risperiDONE (RISPERDAL) 1 MG tablet Take 1 tablet (1 mg total) by mouth 2 (two) times daily. 09/30/18   Gwyneth Sprout, MD    Family History Family History    Problem Relation Age of Onset  . Hypertension Mother     Social History Social History   Tobacco Use  . Smoking status: Current Every Day Smoker    Packs/day: 0.50    Types: Cigarettes  . Smokeless tobacco: Never Used  Substance Use Topics  . Alcohol use: Yes  . Drug use: Yes    Types: Marijuana    Comment: Last used:  10/02/18     Allergies   Haldol [haloperidol]   Review of Systems Review of Systems  Constitutional: Negative for chills and fever.  HENT: Negative for congestion.   Eyes: Negative for visual disturbance.  Respiratory: Negative for shortness of breath.   Cardiovascular: Negative for chest pain.  Gastrointestinal: Negative for abdominal pain and vomiting.  Genitourinary: Negative for dysuria and flank pain.  Musculoskeletal: Negative for back pain, neck pain and neck stiffness.  Skin: Negative for rash.  Neurological: Negative for light-headedness and headaches.  Psychiatric/Behavioral: Positive for hallucinations.     Physical Exam Updated Vital Signs BP 130/81 (BP Location: Right Arm)   Pulse 95   Temp 98 F (36.7 C) (Oral)   Resp 18   Ht 5\' 10"  (1.778 m)   Wt 102.1 kg   SpO2 97%   BMI 32.28 kg/m   Physical Exam Vitals signs and nursing note reviewed.  Constitutional:      Appearance: He is well-developed.  HENT:     Head: Normocephalic and atraumatic.  Eyes:     General:        Right  eye: No discharge.        Left eye: No discharge.     Conjunctiva/sclera: Conjunctivae normal.  Neck:     Musculoskeletal: Normal range of motion and neck supple.     Trachea: No tracheal deviation.  Cardiovascular:     Rate and Rhythm: Normal rate and regular rhythm.  Pulmonary:     Effort: Pulmonary effort is normal.     Breath sounds: Normal breath sounds.  Abdominal:     General: There is no distension.     Palpations: Abdomen is soft.     Tenderness: There is no abdominal tenderness. There is no guarding.  Skin:    General: Skin is warm.      Findings: No rash.  Neurological:     Mental Status: He is alert and oriented to person, place, and time.  Psychiatric:        Attention and Perception: He perceives auditory hallucinations.        Mood and Affect: Mood is anxious.        Thought Content: Thought content is paranoid. Thought content does not include homicidal or suicidal ideation. Thought content does not include homicidal or suicidal plan.      ED Treatments / Results  Labs (all labs ordered are listed, but only abnormal results are displayed) Labs Reviewed - No data to display  EKG None  Radiology No results found.  Procedures Procedures (including critical care time)  Medications Ordered in ED Medications - No data to display   Initial Impression / Assessment and Plan / ED Course  I have reviewed the triage vital signs and the nursing notes.  Pertinent labs & imaging results that were available during my care of the patient were reviewed by me and considered in my medical decision making (see chart for details).    Patient with psychiatric history presents with recurrent auditory hallucination.  Patient is stable and medically clear at this time.  Plan for TTS for behavioral health assessment for medical management.  Behavioral health recommends outpatient follow-up.  Patient does not meet inpatient criteria.  Patient stable  Final Clinical Impressions(s) / ED Diagnoses   Final diagnoses:  Auditory hallucinations    ED Discharge Orders    None       Blane OharaZavitz, Samuel Rittenhouse, MD 11/10/18 2328

## 2018-11-10 NOTE — ED Triage Notes (Signed)
Pt states he is hearing voices, and wishing to speak with behavior health. VS WNL. Denies SI/HI.

## 2018-11-10 NOTE — Discharge Instructions (Addendum)
Follow up per behavioral health.  [facility] If you were given medicines take as directed.  If you are on coumadin or contraceptives realize their levels and effectiveness is altered by many different medicines.  If you have any reaction (rash, tongues swelling, other) to the medicines stop taking and see a physician.    If your blood pressure was elevated in the ER make sure you follow up for management with a primary doctor or return for chest pain, shortness of breath or stroke symptoms.  Please follow up as directed and return to the ER or see a physician for new or worsening symptoms.  Thank you. Vitals:   11/10/18 1917 11/10/18 1924  BP: 130/81   Pulse: 95   Resp: 18   Temp: 98 F (36.7 C)   TempSrc: Oral   SpO2: 97%   Weight:  102.1 kg  Height:  5\' 10"  (1.778 m)

## 2018-11-16 ENCOUNTER — Other Ambulatory Visit: Payer: Self-pay

## 2018-11-16 ENCOUNTER — Emergency Department (HOSPITAL_COMMUNITY)
Admission: EM | Admit: 2018-11-16 | Discharge: 2018-11-16 | Disposition: A | Discharge status: Home / Self Care | Payer: Self-pay | Attending: Emergency Medicine | Admitting: Emergency Medicine

## 2018-11-16 ENCOUNTER — Encounter (HOSPITAL_COMMUNITY): Payer: Self-pay | Admitting: *Deleted

## 2018-11-16 DIAGNOSIS — F17210 Nicotine dependence, cigarettes, uncomplicated: Secondary | ICD-10-CM | POA: Insufficient documentation

## 2018-11-16 DIAGNOSIS — F209 Schizophrenia, unspecified: Secondary | ICD-10-CM | POA: Insufficient documentation

## 2018-11-16 DIAGNOSIS — F333 Major depressive disorder, recurrent, severe with psychotic symptoms: Secondary | ICD-10-CM | POA: Insufficient documentation

## 2018-11-16 DIAGNOSIS — Z79899 Other long term (current) drug therapy: Secondary | ICD-10-CM | POA: Insufficient documentation

## 2018-11-16 LAB — COMPREHENSIVE METABOLIC PANEL
ALBUMIN: 4.5 g/dL (ref 3.5–5.0)
ALT: 44 U/L (ref 0–44)
AST: 30 U/L (ref 15–41)
Alkaline Phosphatase: 39 U/L (ref 38–126)
Anion gap: 14 (ref 5–15)
BUN: 11 mg/dL (ref 6–20)
CO2: 22 mmol/L (ref 22–32)
CREATININE: 0.96 mg/dL (ref 0.61–1.24)
Calcium: 9 mg/dL (ref 8.9–10.3)
Chloride: 105 mmol/L (ref 98–111)
GFR calc Af Amer: >60 mL/min (ref >60)
GFR calc non Af Amer: >60 mL/min (ref >60)
Glucose, Bld: 103 mg/dL — ABNORMAL HIGH (ref 70–99)
Potassium: 4.1 mmol/L (ref 3.5–5.1)
Sodium: 141 mmol/L (ref 135–145)
Total Bilirubin: 0.3 mg/dL (ref 0.3–1.2)
Total Protein: 7.9 g/dL (ref 6.5–8.1)

## 2018-11-16 LAB — CBC WITH DIFFERENTIAL/PLATELET
Abs Immature Granulocytes: 0.09 10*3/uL — ABNORMAL HIGH (ref 0.00–0.07)
BASOS PCT: 1 %
Basophils Absolute: 0 10*3/uL (ref 0.0–0.1)
EOS PCT: 4 %
Eosinophils Absolute: 0.2 10*3/uL (ref 0.0–0.5)
HCT: 41.2 % (ref 39.0–52.0)
Hemoglobin: 13 g/dL (ref 13.0–17.0)
Immature Granulocytes: 2 %
Lymphocytes Relative: 30 %
Lymphs Abs: 1.5 10*3/uL (ref 0.7–4.0)
MCH: 28 pg (ref 26.0–34.0)
MCHC: 31.6 g/dL (ref 30.0–36.0)
MCV: 88.8 fL (ref 80.0–100.0)
Monocytes Absolute: 0.6 10*3/uL (ref 0.1–1.0)
Monocytes Relative: 12 %
Neutro Abs: 2.6 10*3/uL (ref 1.7–7.7)
Neutrophils Relative %: 51 %
Platelets: 297 10*3/uL (ref 150–400)
RBC: 4.64 MIL/uL (ref 4.22–5.81)
RDW: 12.9 % (ref 11.5–15.5)
WBC: 5.1 10*3/uL (ref 4.0–10.5)
nRBC: 0 % (ref 0.0–0.2)

## 2018-11-16 LAB — ACETAMINOPHEN LEVEL: Acetaminophen (Tylenol), Serum: <10 ug/mL — ABNORMAL LOW (ref 10–30)

## 2018-11-16 LAB — RAPID URINE DRUG SCREEN, HOSP PERFORMED
Amphetamines: NOT DETECTED
Barbiturates: NOT DETECTED
Benzodiazepines: NOT DETECTED
Cocaine: NOT DETECTED
Opiates: NOT DETECTED
TETRAHYDROCANNABINOL: NOT DETECTED

## 2018-11-16 LAB — ETHANOL: Alcohol, Ethyl (B): <10 mg/dL (ref <10)

## 2018-11-16 LAB — SALICYLATE LEVEL: Salicylate Lvl: <7 mg/dL (ref 2.8–30.0)

## 2018-11-16 MED ORDER — RISPERIDONE 1 MG PO TABS
1.0000 mg | ORAL_TABLET | Freq: Once | ORAL | Status: AC
  Administered 2018-11-16: 1 mg via ORAL
  Filled 2018-11-16: qty 1

## 2018-11-16 MED ORDER — BENZTROPINE MESYLATE 0.5 MG PO TABS
0.5000 mg | ORAL_TABLET | Freq: Once | ORAL | Status: AC
  Administered 2018-11-16: 0.5 mg via ORAL
  Filled 2018-11-16: qty 1

## 2018-11-16 NOTE — ED Triage Notes (Signed)
Pt brought in by [employer] d/t c/o auditory hallucinations telling him they're going to kill him.

## 2018-11-16 NOTE — ED Notes (Signed)
Patient eating lunch and will be discharged soon.

## 2018-11-16 NOTE — ED Provider Notes (Signed)
[facility]-EMERGENCY DEPT Provider Note   CSN: [id number] Arrival date & time: 11/16/18  0440     History   Chief Complaint Chief Complaint  Patient presents with  . Hallucinations    auditory    HPI Cole Barrett is a 32 y.o. male.  Patient is a 32 year old male with past medical history of paranoid schizophrenia presenting with complaints of hearing voices.  He states that he hears voices saying that they were going to kill him.  He is requesting medication to make these voices go away.  He denies any suicidal or homicidal ideation.  He denies any drug or alcohol use.  The history is provided by the patient.    Past Medical History:  Diagnosis Date  . Homelessness   . Hypercholesterolemia   . Mental disorder   . Paranoid behavior (HCC)   . PE (pulmonary embolism)   . Schizophrenia [facility](HCC)     Patient Active Problem List   Diagnosis Date Noted  . 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 Alcohol abuse   . Auditory hallucinations   . Alcohol abuse with alcohol-induced mood disorder (HCC) 09/08/2016  . Homelessness 09/03/2016  . Person feigning illness 09/03/2016  . 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 Cannabis use disorder, mild, abuse 08/29/2016  . Pulmonary emboli (HCC) 07/13/2016  . Adjustment disorder with emotional disturbance 03/12/2014  . 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 Acute left flank pain 01/02/2014  . Pulmonary embolism, bilateral (HCC) 01/02/2014    History reviewed. No pertinent surgical history.      Home Medications    Prior to Admission medications   Medication Sig Start Date End Date Taking? Authorizing Provider  benztropine (COGENTIN) 1 MG tablet Take 0.5 tablets (0.5 mg total) by mouth 2 (two) times daily. 09/30/18   Gwyneth SproutPlunkett, Whitney, MD  risperiDONE (RISPERDAL) 1 MG tablet Take 1 tablet (1 mg total) by mouth 2 (two) times daily.  09/30/18   Gwyneth SproutPlunkett, Whitney, MD    Family History Family History  Problem Relation Age of Onset  . Hypertension Mother     Social History Social History   Tobacco Use  . Smoking status: Current Every Day Smoker    Packs/day: 0.50    Types: Cigarettes  . Smokeless tobacco: Never Used  Substance Use Topics  . Alcohol use: Yes  . Drug use: Yes    Types: Marijuana    Comment: Last used:  10/02/18     Allergies   Haldol [haloperidol]   Review of Systems Review of Systems  All other systems reviewed and are negative.    Physical Exam Updated Vital Signs BP 122/81 (BP Location: Right Arm)   Pulse 88   Temp (!) 97.4 F (36.3 C) (Oral)   Resp 16   Ht 5\' 10"  (1.778 m)   Wt 102.1 kg   SpO2 98%   BMI 32.28 kg/m   Physical Exam Vitals signs and nursing note reviewed.  Constitutional:      General: He is not in acute distress.    Appearance: He is well-developed. He is not diaphoretic.  HENT:     Head: Normocephalic and atraumatic.  Neck:     Musculoskeletal: Normal range of motion and neck supple.  Cardiovascular:     Rate and Rhythm: Normal rate and regular rhythm.     Heart sounds: No murmur. No friction rub.  Pulmonary:     Effort: Pulmonary effort is normal.  No respiratory distress.     Breath sounds: Normal breath sounds. No wheezing or rales.  Abdominal:     General: Bowel sounds are normal. There is no distension.     Palpations: Abdomen is soft.     Tenderness: There is no abdominal tenderness.  Musculoskeletal: Normal range of motion.  Skin:    General: Skin is warm and dry.  Neurological:     Mental Status: He is alert and oriented to person, place, and time.     Coordination: Coordination normal.  Psychiatric:        Mood and Affect: Mood and affect normal.        Speech: Speech normal.        Behavior: Behavior normal. Behavior is cooperative.        Thought Content: Thought content is paranoid. Thought content does not include homicidal or  suicidal ideation.        Cognition and Memory: Cognition normal.      ED Treatments / Results  Labs (all labs ordered are listed, but only abnormal results are displayed) Labs Reviewed  CBC WITH DIFFERENTIAL/PLATELET - Abnormal; Notable for the following components:      Result Value   Abs Immature Granulocytes 0.09 (*)    All other components within normal limits  RAPID URINE DRUG SCREEN, HOSP PERFORMED  COMPREHENSIVE METABOLIC PANEL  ETHANOL  SALICYLATE LEVEL  ACETAMINOPHEN LEVEL    EKG None  Radiology No results found.  Procedures Procedures (including critical care time)  Medications Ordered in ED Medications - No data to display   Initial Impression / Assessment and Plan / ED Course  I have reviewed the triage vital signs and the nursing notes.  Pertinent labs & imaging results that were available during my care of the patient were reviewed by me and considered in my medical decision making (see chart for details).  Patient presents with complaints of auditory hallucinations.  He has been seen by TTS and will remain in the department for formal psychiatric consultation in the morning.  Final Clinical Impressions(s) / ED Diagnoses   Final diagnoses:  None    ED Discharge Orders    None       Geoffery Lyonselo, Yareliz Thorstenson, MD 11/16/18 [phone]

## 2018-11-16 NOTE — ED Notes (Signed)
Pt alert and oriented. Pt denies any pain or discomfort. Pt denies any hi, and si. Pt c/o of hearing voices. Pt states " the voices are saying they going to kill me". Pt contract to safety. Pt resting in bed, pt safe. Will continue to monitor.

## 2018-11-16 NOTE — BHH Suicide Risk Assessment (Signed)
Suicide Risk Assessment  Discharge Assessment   [facility]   Principal Problem: MDD (major depressive disorder), recurrent, severe, with psychosis (HCC) Discharge Diagnoses: Principal Problem:   MDD (major depressive disorder), recurrent, severe, with psychosis (HCC)   Total Time spent with patient: 30 minutes  Musculoskeletal: Strength & Muscle Tone: within normal limits Gait & Station: normal Patient leans: N/A  Psychiatric Specialty Exam:   Blood pressure 122/81, pulse 88, temperature (!) 97.4 F (36.3 C), temperature source Oral, resp. rate 16, height 5\' 10"  (1.778 m), weight 102.1 kg, SpO2 98 %.Body mass index is 32.28 kg/m.  General Appearance: Casual  Eye Contact::  Good  Speech:  Clear and Coherent and Normal Rate409  Volume:  Normal  Mood:  Euthymic  Affect:  Congruent  Thought Process:  Coherent, Goal Directed, Linear and Descriptions of Associations: Intact  Orientation:  Full (Time, Place, and Person)  Thought Content:  Logical  Suicidal Thoughts:  No  Homicidal Thoughts:  No  Memory:  Immediate;   Good Recent;   Good Remote;   Fair  Judgement:  Fair  Insight:  Fair  Psychomotor Activity:  Normal  Concentration:  Good  Recall:  Good  Fund of Knowledge:Good  Language: Good  Akathisia:  No  Handed:  Right  AIMS (if indicated):     Assets:  ArchitectCommunication Skills Financial Resources/Insurance Housing Social Support  Sleep:     Cognition: WNL  ADL's:  Intact   Mental Status Per Nursing Assessment::   On Admission:   Pt is well known to this emergency room and has had 22 visits in 2019. Pt presents with auditory hallucinations which sometimes become very loud and he gets upset and afraid and seeks shelter in the emergency room. Pt was calm and cooperative and medication compliant in the emergency room. Pt was given a dose of his antipsychotic medication in the emergency room prior to discharge. Pt stated he has his medications at  home but the voices were so loud he came to emergency department to be safe. Pt is psychiatrically clear for discharge and follow [facility].   Demographic Factors:  Male and Unemployed  Loss Factors: NA  Historical Factors: Family history of mental illness or substance abuse  Risk Reduction Factors:   Sense of responsibility to family and Positive social support  Continued Clinical Symptoms:  Depression:   Comorbid alcohol abuse/dependence  Cognitive Features That Contribute To Risk:  Closed-mindedness    Suicide Risk:  Minimal: No identifiable suicidal ideation.  Patients presenting with no risk factors but with morbid ruminations; may be classified as minimal risk based on the severity of the depressive symptoms    Plan Of Care/Follow-up recommendations:  Activity:  as tolerated Diet:  Heart healthy  Laveda AbbeLaurie Britton Parks, NP 11/16/2018, 11:46 AM

## 2018-11-16 NOTE — BH Assessment (Signed)
[facility]  Per Juanetta BeetsJacqueline Norman, DO, this pt does not require psychiatric hospitalization at this time.  [facility].  This has been included in pt's discharge instructions.  Pt's nurse, Aram BeechamCynthia has been notified.  Doylene Canninghomas Denea Cheaney, MA Triage Specialist [phone]

## 2018-11-16 NOTE — ED Notes (Signed)
Bed: WBH43 Expected date:  Expected time:  Means of arrival:  Comments: Triage 5 

## 2018-11-16 NOTE — BH Assessment (Signed)
Assessment Note  Cole Barrett is an 32 y.o. male, who [facility] Per chart, pt has had twenty-two TTS consults in 2019. Clinician asked the pt, "what brought you to the hospital?" Pt reported, "hearing voices saying their gonna kill me." Pt reported, needing rest, and wanting to talk to the doctor about med's. Pt reported, his [facility] ago however pt could not list the names of his medications. Pt denies, SI, HI, self-injurious behaviors and access to weapons.   Pt denies abuse and substance use. Pt's UDS is negative. [facility]. Pt has previous inpatient admissions.   Pt presents alert in scrubs with logical/coherent speech. Pt's eye contact was fair. Pt's mood was anxious. Pt's affect was flat. Pt's thought process was coherent, relevant. Pt's judgement was unimpaired. Pt was oriented x4. Pt's concentration was normal. Pt's insight, and impulse control are fair. Pt reported, [facility] he could contract for safety.   Diagnosis: Schizophrenia.  Past Medical History:  Past Medical History:  Diagnosis Date  . Homelessness   . Hypercholesterolemia   . Mental disorder   . Paranoid behavior (HCC)   . PE (pulmonary embolism)   . Schizophrenia (HCC)     History reviewed. No pertinent surgical history.  Family History:  Family History  Problem Relation Age of Onset  . Hypertension Mother     Social History:  reports that he has been smoking cigarettes. He has been smoking about 0.50 packs per day. He has never used smokeless tobacco. He reports current alcohol use. He reports current drug use. Drug: Marijuana.  Additional Social History:  Alcohol / Drug Use Pain Medications: See MAR  Prescriptions: See MAR Over the Counter: See MAR History of alcohol / drug use?: No history of alcohol / drug abuse  CIWA: CIWA-Ar BP: 122/81 Pulse Rate: 88 COWS:     Allergies:  Allergies  Allergen Reactions  . Haldol [Haloperidol] Shortness Of Breath    Home Medications: (Not in a hospital admission)   OB/GYN Status:  No LMP for male patient.  General Assessment Data Location of Assessment: [facility] TTS Assessment: In system Is this a Tele or Face-to-Face Assessment?: Face-to-Face Is this an Initial Assessment or a Re-assessment for this encounter?: Initial Assessment Patient Accompanied by:: N/A Living Arrangements: Other (Comment)(Alone.) What gender do you identify as?: Male Marital status: Single Living Arrangements: Alone Can pt return to current living arrangement?: Yes Admission Status: Voluntary Is patient capable of signing voluntary admission?: Yes Referral Source: Self/Family/Friend Insurance type: Self-pay.      Crisis Care Plan Living Arrangements: Alone Legal Guardian: Other:(Self. ) Name of Psychiatrist: [facility]. Name of Therapist: NA  Education Status Is patient currently in school?: No Is the patient employed, unemployed or receiving disability?: Unemployed  Risk to self with the past 6 months Suicidal Ideation: No(Pt denies.) Has patient been a risk to self within the past 6 months prior to admission? : No Suicidal Intent: No Has patient had any suicidal intent within the past 6 months prior to admission? : No Is patient at risk for suicide?: No Suicidal Plan?: No Has patient had any suicidal plan within the past 6 months prior to admission? : No Access to Means: No What has been your use of drugs/alcohol within the last 12 months?: Pt's UDS is negative.  Previous Attempts/Gestures: No How many times?: 0 Other Self Harm Risks: NA Triggers for Past Attempts:  None known Intentional Self Injurious Behavior: None Family Suicide History: No Recent stressful life event(s): Other (Comment)(Hearing voices. ) Persecutory voices/beliefs?: Yes Depression: No Substance abuse history and/or treatment for substance  abuse?: No Suicide prevention information given to non-admitted patients: Not applicable  Risk to Others within the past 6 months Homicidal Ideation: No(Pt denies.) Does patient have any lifetime risk of violence toward others beyond the six months prior to admission? : No Thoughts of Harm to Others: No(Pt denies.) Current Homicidal Intent: No Current Homicidal Plan: No(Pt denies.) Access to Homicidal Means: No Identified Victim: NA History of harm to others?: No Assessment of Violence: None Noted Violent Behavior Description: NA Does patient have access to weapons?: No Criminal Charges Pending?: No Does patient have a court date: No Is patient on probation?: No  Psychosis Hallucinations: Auditory Delusions: None noted  Mental Status Report Appearance/Hygiene: In scrubs Eye Contact: Fair Motor Activity: Unremarkable Speech: Logical/coherent Level of Consciousness: Alert Anxiety Level: Moderate Thought Processes: Coherent, Relevant Judgement: Unimpaired Orientation: Person, Place, Time, Situation Obsessive Compulsive Thoughts/Behaviors: None  Cognitive Functioning Concentration: Normal Memory: Recent Intact Is patient IDD: No Insight: Fair Impulse Control: Fair Appetite: Fair Have you had any weight changes? : No Change Sleep: No Change Total Hours of Sleep: 8 Vegetative Symptoms: None  ADLScreening [facility] Assessment Services) Patient's cognitive ability adequate to safely complete daily activities?: Yes Patient able to express need for assistance with ADLs?: Yes Independently performs ADLs?: Yes (appropriate for developmental age)  Prior Inpatient Therapy Prior Inpatient Therapy: Yes Prior Therapy Dates: Per chart, "multiple."  Prior Therapy Facilty/Provider(s): [facility] chart[facility]  Reason for Treatment: Psychosis.   Prior Outpatient Therapy Prior Outpatient Therapy: Yes Prior Therapy Dates: Current Prior Therapy Facilty/Provider(s): [facility].   Reason for Treatment: Medication management. Does patient have an ACCT team?: No Does patient have Intensive In-House Services?  : No Does patient have [facility] services? : Yes Does patient have P4CC services?: No  ADL Screening (condition at time of admission) Patient's cognitive ability adequate to safely complete daily activities?: Yes Is the patient deaf or have difficulty hearing?: No Does the patient have difficulty seeing, even when wearing glasses/contacts?: No Does the patient have difficulty concentrating, remembering, or making decisions?: No Patient able to express need for assistance with ADLs?: Yes Does the patient have difficulty dressing or bathing?: No Independently performs ADLs?: Yes (appropriate for developmental age) Does the patient have difficulty walking or climbing stairs?: No Weakness of Legs: None Weakness of Arms/Hands: None  Home Assistive Devices/Equipment Home Assistive Devices/Equipment: None    Abuse/Neglect Assessment (Assessment to be complete while patient is alone) Abuse/Neglect Assessment Can Be Completed: Yes Physical Abuse: Denies Verbal Abuse: Denies Sexual Abuse: Denies Exploitation of patient/patient's resources: Denies Self-Neglect: Denies     Merchant navy officerAdvance Directives (For Healthcare) Does Patient Have a Medical Advance Directive?: No Would patient like information on creating a medical advance directive?: No - Patient declined          Disposition: Donell SievertSpencer Simon, PA recommends overnight observation for safety, stabilization and re-evaluation. Disposition discussed with Dr. Judd Lienelo and Melina Schoolsobyn, RN.   Disposition Initial Assessment Completed for this Encounter: Yes  On Site Evaluation by: Redmond Pullingreylese D Koven Belinsky, MS, LPC, CRC. Reviewed with Physician: Dr. Judd Lienelo and Donell SievertSpencer Simon, PA.  Redmond Pullingreylese D Teniqua Marron 11/16/2018 6:28 AM   Redmond Pullingreylese D Jamila Slatten, MS, [facility], [facility] Triage Specialist [phone]

## 2018-11-16 NOTE — Discharge Instructions (Addendum)
For your behavioral health needs, you are advised to continue treatment with [facility]: ° °     [facility] °     [address] °     [place], [place] [address] °     [phone] °

## 2018-11-16 NOTE — ED Notes (Signed)
Patient calm and cooperative, alert and oriented.  Denies SI, Hi, and AV hallucinations at this time.Reports he was hearing loud voices last night and just needs some medication to make the voices go away.

## 2018-11-25 ENCOUNTER — Encounter (HOSPITAL_COMMUNITY): Payer: Self-pay | Admitting: Emergency Medicine

## 2018-11-25 ENCOUNTER — Emergency Department (HOSPITAL_COMMUNITY)
Admission: EM | Admit: 2018-11-25 | Discharge: 2018-11-26 | Disposition: A | Discharge status: Home / Self Care | Payer: Self-pay | Attending: Emergency Medicine | Admitting: Emergency Medicine

## 2018-11-25 ENCOUNTER — Other Ambulatory Visit: Payer: Self-pay

## 2018-11-25 DIAGNOSIS — F17210 Nicotine dependence, cigarettes, uncomplicated: Secondary | ICD-10-CM | POA: Insufficient documentation

## 2018-11-25 DIAGNOSIS — F4329 Adjustment disorder with other symptoms: Secondary | ICD-10-CM

## 2018-11-25 DIAGNOSIS — R45851 Suicidal ideations: Secondary | ICD-10-CM

## 2018-11-25 DIAGNOSIS — F209 Schizophrenia, unspecified: Secondary | ICD-10-CM | POA: Insufficient documentation

## 2018-11-25 DIAGNOSIS — Z79899 Other long term (current) drug therapy: Secondary | ICD-10-CM | POA: Insufficient documentation

## 2018-11-25 DIAGNOSIS — R443 Hallucinations, unspecified: Secondary | ICD-10-CM

## 2018-11-25 DIAGNOSIS — Z590 Homelessness: Secondary | ICD-10-CM | POA: Insufficient documentation

## 2018-11-25 LAB — ETHANOL: Alcohol, Ethyl (B): <10 mg/dL (ref <10)

## 2018-11-25 LAB — CBC
HCT: 36.6 % — ABNORMAL LOW (ref 39.0–52.0)
Hemoglobin: 11.7 g/dL — ABNORMAL LOW (ref 13.0–17.0)
MCH: 28.1 pg (ref 26.0–34.0)
MCHC: 32 g/dL (ref 30.0–36.0)
MCV: 87.8 fL (ref 80.0–100.0)
NRBC: 0 % (ref 0.0–0.2)
Platelets: 293 10*3/uL (ref 150–400)
RBC: 4.17 MIL/uL — ABNORMAL LOW (ref 4.22–5.81)
RDW: 12.9 % (ref 11.5–15.5)
WBC: 5.4 10*3/uL (ref 4.0–10.5)

## 2018-11-25 LAB — COMPREHENSIVE METABOLIC PANEL
ALT: 35 U/L (ref 0–44)
AST: 24 U/L (ref 15–41)
Albumin: 4.2 g/dL (ref 3.5–5.0)
Alkaline Phosphatase: 36 U/L — ABNORMAL LOW (ref 38–126)
Anion gap: 8 (ref 5–15)
BUN: 15 mg/dL (ref 6–20)
CHLORIDE: 107 mmol/L (ref 98–111)
CO2: 23 mmol/L (ref 22–32)
CREATININE: 0.93 mg/dL (ref 0.61–1.24)
Calcium: 8.6 mg/dL — ABNORMAL LOW (ref 8.9–10.3)
GFR calc Af Amer: >60 mL/min (ref >60)
GFR calc non Af Amer: >60 mL/min (ref >60)
Glucose, Bld: 95 mg/dL (ref 70–99)
Potassium: 3.8 mmol/L (ref 3.5–5.1)
SODIUM: 138 mmol/L (ref 135–145)
Total Bilirubin: 0.7 mg/dL (ref 0.3–1.2)
Total Protein: 7.1 g/dL (ref 6.5–8.1)

## 2018-11-25 LAB — SALICYLATE LEVEL: Salicylate Lvl: <7 mg/dL (ref 2.8–30.0)

## 2018-11-25 LAB — ACETAMINOPHEN LEVEL: Acetaminophen (Tylenol), Serum: <10 ug/mL — ABNORMAL LOW (ref 10–30)

## 2018-11-25 MED ORDER — RISPERIDONE 1 MG PO TABS
1.0000 mg | ORAL_TABLET | Freq: Two times a day (BID) | ORAL | Status: DC
  Administered 2018-11-26 (×2): 1 mg via ORAL
  Filled 2018-11-25 (×2): qty 1

## 2018-11-25 MED ORDER — BENZTROPINE MESYLATE 0.5 MG PO TABS
0.5000 mg | ORAL_TABLET | Freq: Two times a day (BID) | ORAL | Status: DC
  Administered 2018-11-26 (×2): 0.5 mg via ORAL
  Filled 2018-11-25 (×2): qty 1

## 2018-11-25 NOTE — ED Notes (Signed)
Bed: [facility] Expected date:  Expected time:  Means of arrival:  Comments: 

## 2018-11-25 NOTE — ED Notes (Signed)
Bed: [facility]39 Expected date:  Expected time:  Means of arrival:  Comments: 

## 2018-11-25 NOTE — ED Notes (Signed)
Nira ConnJason Berry, NP, recommends overnight observation for safety and stabilization for auditory hallucinations. Elsie RaJeneen, RN, informed of disposition.

## 2018-11-25 NOTE — ED Triage Notes (Signed)
Patient states that voices are telling him to kill himself.

## 2018-11-25 NOTE — BH Assessment (Signed)
Assessment Note  Cole Barrett is an 32 y.o. male presenting with auditory hallucinations "they are going to kill me". Patient reports voices are getting worse. Patient [facility]. Patient reported loosing meds after being arrested and in jail for 2 days due to trespassing, which patient reported he was unaware that he was trespassing. Patient reported no self harming behaviors. Patient reported over and over throughout assessment "I just need to get into behavioral health, so they can watch over me, the voices got to go, this is the best thing for me to do, I just need to get into behavioral health, ....". Patient reported hearing voices for the past 1 year. Patient denied SI, HI and drug and alcohol usage. Patient was cooperative during assessment. Patient denied any thoughts and plans of suicide with TTS Clinician.   Patient was alert and oriented x4. Patient speech is logical and coherent. Patient judgement is unimpaired. Patient thought process coherent and revalent. Patient anxiety level is moderate. Patient mood is anxious and affect is sad. Patient eye contact is fair.  PER EDP "He was thinking about overdosing on meds but did not overdose currently".  Diagnosis: Schizophrenia  Past Medical History:  Past Medical History:  Diagnosis Date  . Homelessness   . Hypercholesterolemia   . Mental disorder   . Paranoid behavior (HCC)   . PE (pulmonary embolism)   . Schizophrenia (HCC)     History reviewed. No pertinent surgical history.  Family History:  Family History  Problem Relation Age of Onset  . Hypertension Mother     Social History:  reports that he has been smoking cigarettes. He has been smoking about 0.50 packs per day. He has never used smokeless tobacco. He reports current alcohol use. He reports current drug use. Drug: Marijuana.  Additional Social History:  Alcohol / Drug Use Pain Medications: see MAR Prescriptions: see  MAR Over the Counter: see MAR  CIWA: CIWA-Ar BP: 131/72 Pulse Rate: 85 COWS:    Allergies:  Allergies  Allergen Reactions  . Haldol [Haloperidol] Shortness Of Breath    Home Medications: (Not in a hospital admission)   OB/GYN Status:  No LMP for male patient.  General Assessment Data Location of Assessment: [facility] TTS Assessment: In system Is this a Tele or Face-to-Face Assessment?: Face-to-Face Is this an Initial Assessment or a Re-assessment for this encounter?: Initial Assessment Patient Accompanied by:: N/A Living Arrangements: Other (Comment)(alone) What gender do you identify as?: Male Marital status: Single Living Arrangements: Alone Can pt return to current living arrangement?: Yes Admission Status: Voluntary Is patient capable of signing voluntary admission?: Yes Referral Source: Self/Family/Friend     Crisis Care Plan Living Arrangements: Alone Legal Guardian: Other:(self) Name of Psychiatrist: [facility]. Name of Therapist: NA  Education Status Is patient currently in school?: No Is the patient employed, unemployed or receiving disability?: Employed  Risk to self with the past 6 months Suicidal Ideation: No Has patient been a risk to self within the past 6 months prior to admission? : No Suicidal Intent: No Has patient had any suicidal intent within the past 6 months prior to admission? : No Is patient at risk for suicide?: No Suicidal Plan?: No Has patient had any suicidal plan within the past 6 months prior to admission? : No Access to Means: No What has been your use of drugs/alcohol within the last 12 months?: (denied) Previous Attempts/Gestures: No How many times?: (0) Other Self Harm Risks: (denied) Triggers  for Past Attempts: None known Intentional Self Injurious Behavior: None Family Suicide History: No Recent stressful life event(s): Other (Comment)(hearing voices) Persecutory voices/beliefs?: Yes Depression: No Substance abuse history  and/or treatment for substance abuse?: No  Risk to Others within the past 6 months Homicidal Ideation: No Does patient have any lifetime risk of violence toward others beyond the six months prior to admission? : No Thoughts of Harm to Others: No Current Homicidal Intent: No Current Homicidal Plan: No Access to Homicidal Means: No History of harm to others?: No Assessment of Violence: None Noted Does patient have access to weapons?: No Criminal Charges Pending?: No Does patient have a court date: No Is patient on probation?: No  Psychosis Hallucinations: Auditory Delusions: None noted  Mental Status Report Appearance/Hygiene: Unremarkable Eye Contact: Fair Motor Activity: Unremarkable Speech: Logical/coherent Level of Consciousness: Alert Mood: Anxious Affect: Sad Anxiety Level: Moderate Thought Processes: Coherent, Relevant Judgement: Unimpaired Orientation: Person, Place, Time, Situation Obsessive Compulsive Thoughts/Behaviors: None  Cognitive Functioning Concentration: Normal Memory: Recent Intact Is patient IDD: No Insight: Fair Impulse Control: Fair Appetite: Fair Have you had any weight changes? : No Change Sleep: No Change Total Hours of Sleep: (8) Vegetative Symptoms: None  ADLScreening [facility] Assessment Services) Patient's cognitive ability adequate to safely complete daily activities?: Yes Patient able to express need for assistance with ADLs?: Yes Independently performs ADLs?: Yes (appropriate for developmental age)  Prior Inpatient Therapy Prior Inpatient Therapy: Yes Prior Therapy Dates: Per chart, "multiple."  Prior Therapy Facilty/Provider(s): Per chart, [facility], [facility].  Reason for Treatment: Psychosis.   Prior Outpatient Therapy Prior Outpatient Therapy: Yes Prior Therapy Dates: Current Prior Therapy Facilty/Provider(s): [facility].  Reason for Treatment: Medication management. Does patient have an ACCT team?: No Does patient have  Intensive In-House Services?  : No Does patient have [facility] services? : Yes Does patient have P4CC services?: No  ADL Screening (condition at time of admission) Patient's cognitive ability adequate to safely complete daily activities?: Yes Patient able to express need for assistance with ADLs?: Yes Independently performs ADLs?: Yes (appropriate for developmental age)    Merchant navy officerAdvance Directives (For Healthcare) Does Patient Have a Medical Advance Directive?: No Would patient like information on creating a medical advance directive?: No - Patient declined    Disposition:  Disposition Initial Assessment Completed for this Encounter: Yes  Nira ConnJason Berry, NP, recommends overnight observation for safety and stabilization for auditory hallucinations. RN informed of disposition.   On Site Evaluation by:   Reviewed with Physician:    Burnetta SabinLatisha D Devontaye Ground, [facility] 11/25/2018 10:05 PM

## 2018-11-25 NOTE — ED Notes (Addendum)
Received Cole Barrett from the main ED, alert and oriented. He was oriented and given nourishments. He drifted off to sleep without incident. He got up later to use the bathroom and [facility].

## 2018-11-25 NOTE — ED Provider Notes (Signed)
[facility]-EMERGENCY DEPT Provider Note   CSN: [id number] Arrival date & time: 11/25/18  2000     History   Chief Complaint Chief Complaint  Patient presents with  . Suicidal    HPI Cole Barrett is a 32 y.o. male hx of schizophrenia, here presenting with hallucinations.  Patient states that he has auditory hallucinations telling him to kill himself.  He was thinking about overdosing on meds but did not overdose currently.  Patient states that he is also very depressed as well. Patient was seen here recently for similar complaints and was cleared by psychiatry.   The history is provided by the patient.    Past Medical History:  Diagnosis Date  . Homelessness   . Hypercholesterolemia   . Mental disorder   . Paranoid behavior (HCC)   . PE (pulmonary embolism)   . Schizophrenia [facility](HCC)     Patient Active Problem List   Diagnosis Date Noted  . 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 Alcohol abuse   . Auditory hallucinations   . Alcohol abuse with alcohol-induced mood disorder (HCC) 09/08/2016  . Homelessness 09/03/2016  . Person feigning illness 09/03/2016  . 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 Cannabis use disorder, mild, abuse 08/29/2016  . Pulmonary emboli (HCC) 07/13/2016  . Adjustment disorder with emotional disturbance 03/12/2014  . 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 Acute left flank pain 01/02/2014  . Pulmonary embolism, bilateral (HCC) 01/02/2014    History reviewed. No pertinent surgical history.      Home Medications    Prior to Admission medications   Medication Sig Start Date End Date Taking? Authorizing Provider  benztropine (COGENTIN) 1 MG tablet Take 0.5 tablets (0.5 mg total) by mouth 2 (two) times daily. 09/30/18  Yes Plunkett, Alphonzo LemmingsWhitney, MD  risperiDONE (RISPERDAL) 1 MG tablet Take 1 tablet (1 mg total) by mouth 2 (two) times daily. 09/30/18   Yes Gwyneth SproutPlunkett, Whitney, MD    Family History Family History  Problem Relation Age of Onset  . Hypertension Mother     Social History Social History   Tobacco Use  . Smoking status: Current Every Day Smoker    Packs/day: 0.50    Types: Cigarettes  . Smokeless tobacco: Never Used  Substance Use Topics  . Alcohol use: Yes  . Drug use: Yes    Types: Marijuana    Comment: Last used:  10/02/18     Allergies   Haldol [haloperidol]   Review of Systems Review of Systems  Psychiatric/Behavioral: Positive for suicidal ideas.  All other systems reviewed and are negative.    Physical Exam Updated Vital Signs BP 131/72 (BP Location: Left Arm)   Pulse 85   Temp 98.1 F (36.7 C) (Oral)   Resp 18   Ht 5\' 10"  (1.778 m)   Wt 102.1 kg   SpO2 99%   BMI 32.30 kg/m   Physical Exam Vitals signs and nursing note reviewed.  Constitutional:      Appearance: Normal appearance.  HENT:     Head: Normocephalic.     Mouth/Throat:     Mouth: Mucous membranes are moist.  Eyes:     Extraocular Movements: Extraocular movements intact.     Pupils: Pupils are equal, round, and reactive to light.  Neck:     Musculoskeletal: Normal range of motion.  Cardiovascular:     Rate and Rhythm: Normal rate.  Pulmonary:     Effort: Pulmonary effort  is normal.     Breath sounds: Normal breath sounds.  Abdominal:     General: Abdomen is flat.  Musculoskeletal: Normal range of motion.  Skin:    General: Skin is warm.  Neurological:     General: No focal deficit present.     Mental Status: He is alert.  Psychiatric:     Comments: Responding internal stimuli. Depressed       ED Treatments / Results  Labs (all labs ordered are listed, but only abnormal results are displayed) Labs Reviewed  COMPREHENSIVE METABOLIC PANEL - Abnormal; Notable for the following components:      Result Value   Calcium 8.6 (*)    Alkaline Phosphatase 36 (*)    All other components within normal limits    ACETAMINOPHEN LEVEL - Abnormal; Notable for the following components:   Acetaminophen (Tylenol), Serum <10 (*)    All other components within normal limits  CBC - Abnormal; Notable for the following components:   RBC 4.17 (*)    Hemoglobin 11.7 (*)    HCT 36.6 (*)    All other components within normal limits  ETHANOL  SALICYLATE LEVEL  RAPID URINE DRUG SCREEN, HOSP PERFORMED    EKG None  Radiology No results found.  Procedures Procedures (including critical care time)  Medications Ordered in ED Medications - No data to display   Initial Impression / Assessment and Plan / ED Course  I have reviewed the triage vital signs and the nursing notes.  Pertinent labs & imaging results that were available during my care of the patient were reviewed by me and considered in my medical decision making (see chart for details).    Cole HawkingChristopher Adam Barrett is a 32 y.o. male here with hallucinations. Patient has hx of schizophrenia. Will get psych clearance labs, consult TTS.   10:32 PM Medically cleared. Psych will observe overnight. Stable to move to [facility]. Holding orders placed.   Final Clinical Impressions(s) / ED Diagnoses   Final diagnoses:  None    ED Discharge Orders    None       Charlynne PanderYao, David Hsienta, MD 11/25/18 2233

## 2018-11-26 DIAGNOSIS — F4329 Adjustment disorder with other symptoms: Secondary | ICD-10-CM

## 2018-11-26 LAB — RAPID URINE DRUG SCREEN, HOSP PERFORMED
Amphetamines: NOT DETECTED
Barbiturates: NOT DETECTED
Benzodiazepines: NOT DETECTED
Cocaine: NOT DETECTED
Opiates: NOT DETECTED
Tetrahydrocannabinol: NOT DETECTED

## 2018-11-26 MED ORDER — RISPERIDONE 1 MG PO TABS: 1.0000 mg | ORAL_TABLET | Freq: Two times a day (BID) | ORAL | 0 refills | Status: DC

## 2018-11-26 MED ORDER — BENZTROPINE MESYLATE 1 MG PO TABS: 0.5000 mg | ORAL_TABLET | Freq: Two times a day (BID) | ORAL | 0 refills | Status: DC

## 2018-11-26 NOTE — BH Assessment (Addendum)
[facility] Assessment Progress Note  Per Dr Fredda HammedAktar, this pt does not require psychiatric hospitalization at this time.  [facility].  This has been included in pt's discharge instructions.  Pt's nurse, Diane, has been notified.  Doylene Canninghomas Kasheem Toner, MA Triage Specialist [phone]

## 2018-11-26 NOTE — Discharge Instructions (Signed)
For your mental health needs, you are advised to continue treatment with [facility]: ° °     [facility] °     [address] °     [place], [place] [address] °     [phone] °

## 2018-11-26 NOTE — ED Notes (Signed)
Pt discharged home. Discharged instructions read to pt who verbalized understanding. All belongings returned to pt who signed for same. Denies SI/HI, is not delusional and not responding to internal stimuli. Escorted pt to the ED exit.    

## 2018-11-26 NOTE — Consult Note (Addendum)
[facility]  11/26/2018 9:47 AM Jacquelin HawkingChristopher Adam Oftedahl  MRN:  [id number] Principal Problem: Adjustment disorder with emotional disturbance Discharge Diagnoses: Principal Problem:   Adjustment disorder with emotional disturbance  Subjective: 32 yo male who presented to the ED for hallucinations, out of his medications due to misplacing these while in jail.  Patient is well known to this ED and providers for similar presentations.  He is homeless and often seeks shelter for refuge.  On assessment, no suicidal/homicidal ideations, hallucinations, or substance abuse.  [facility].  Hallucinations increase with substance abuse and lack of medications, decrease with medications.  Not responding to internal stimuli at this time.  Total Time spent with patient: 45 minutes  Past Psychiatric History: schizophrenia  Past Medical History:  Past Medical History:  Diagnosis Date  . Homelessness   . Hypercholesterolemia   . Mental disorder   . Paranoid behavior (HCC)   . PE (pulmonary embolism)   . Schizophrenia (HCC)    History reviewed. No pertinent surgical history. Family History:  Family History  Problem Relation Age of Onset  . Hypertension Mother    Family Psychiatric  History: none Social History:  Social History   Substance and Sexual Activity  Alcohol Use Yes     Social History   Substance and Sexual Activity  Drug Use Yes  . Types: Marijuana   Comment: Last used:  10/02/18    Social History   Socioeconomic History  . Marital status: Single    Spouse name: Not on file  . Number of children: Not on file  . Years of education: Not on file  . Highest education level: Not on file  Occupational History  . Not on file  Social Needs  . Financial resource strain: Not on file  . Food insecurity:    Worry: Not on file    Inability: Not on file  . Transportation needs:    Medical: Not on file    Non-medical: Not on file   Tobacco Use  . Smoking status: Current Every Day Smoker    Packs/day: 0.50    Types: Cigarettes  . Smokeless tobacco: Never Used  Substance and Sexual Activity  . Alcohol use: Yes  . Drug use: Yes    Types: Marijuana    Comment: Last used:  10/02/18  . Sexual activity: Never  Lifestyle  . Physical activity:    Days per week: Not on file    Minutes per session: Not on file  . Stress: Not on file  Relationships  . Social connections:    Talks on phone: Not on file    Gets together: Not on file    Attends religious service: Not on file    Active member of club or organization: Not on file    Attends meetings of clubs or organizations: Not on file    Relationship status: Not on file  Other Topics Concern  . Not on file  Social History Narrative   ** Merged History Encounter **        Has this patient used any form of tobacco in the last 30 days? (Cigarettes, Smokeless Tobacco, Cigars, and/or Pipes) A prescription for an FDA-approved tobacco cessation medication was offered at discharge and the patient refused  Current Medications: Current Facility-Administered Medications  Medication Dose Route Frequency Provider Last Rate Last Dose  . benztropine (COGENTIN) tablet 0.5 mg  0.5 mg Oral BID Charlynne PanderYao, David Hsienta, MD  0.5 mg at 11/26/18 0923  . risperiDONE (RISPERDAL) tablet 1 mg  1 mg Oral BID Charlynne PanderYao, David Hsienta, MD   1 mg at 11/26/18 [id number]   Current Outpatient Medications  Medication Sig Dispense Refill  . benztropine (COGENTIN) 1 MG tablet Take 0.5 tablets (0.5 mg total) by mouth 2 (two) times daily. 60 tablet 0  . risperiDONE (RISPERDAL) 1 MG tablet Take 1 tablet (1 mg total) by mouth 2 (two) times daily. 60 tablet 0   PTA Medications: (Not in a hospital admission)   Musculoskeletal: Strength & Muscle Tone: within normal limits Gait & Station: normal Patient leans: N/A  Psychiatric Specialty Exam: Physical Exam  Nursing note and vitals reviewed. Constitutional: He  is oriented to person, place, and time. He appears well-developed and well-nourished.  HENT:  Head: Normocephalic.  Neck: Normal range of motion.  Respiratory: Effort normal.  Musculoskeletal: Normal range of motion.  Neurological: He is alert and oriented to person, place, and time.  Psychiatric: His speech is normal and behavior is normal. Judgment and thought content normal. His affect is blunt. Cognition and memory are normal.    Review of Systems  All other systems reviewed and are negative.   Blood pressure 114/68, pulse 62, temperature 98.1 F (36.7 C), temperature source Oral, resp. rate 18, height 5\' 10"  (1.778 m), weight 102.1 kg, SpO2 99 %.Body mass index is 32.3 kg/m.  General Appearance: Casual  Eye Contact:  Good  Speech:  Clear and Coherent  Volume:  Normal  Mood:  Euthymic  Affect:  Blunt  Thought Process:  Coherent and Descriptions of Associations: Intact  Orientation:  Full (Time, Place, and Person)  Thought Content:  WDL and Logical  Suicidal Thoughts:  No  Homicidal Thoughts:  No  Memory:  Immediate;   Good Recent;   Good Remote;   Good  Judgement:  Fair  Insight:  Fair  Psychomotor Activity:  Normal  Concentration:  Concentration: Good and Attention Span: Good  Recall:  Good  Fund of Knowledge:  Fair  Language:  Good  Akathisia:  No  Handed:  Right  AIMS (if indicated):     Assets:  Leisure Time Physical Health Resilience  ADL's:  Intact  Cognition:  WNL  Sleep:        Demographic Factors:  Male  Loss Factors: NA  Historical Factors: NA  Risk Reduction Factors:   Sense of responsibility to family, Positive social support and Positive therapeutic relationship  Continued Clinical Symptoms:  None   Cognitive Features That Contribute To Risk:  None    Suicide Risk:  Minimal: No identifiable suicidal ideation.  Patients presenting with no risk factors but with morbid ruminations; may be classified as minimal risk based on the severity  of the depressive symptoms  Follow-up Information    Placey, Chales AbrahamsMary Ann, NP Follow up.   Why:  Please go [facility]Mary [facility]. Contact information: [address] [place] [id number] [phone]           Plan Of Care/Follow-up recommendations:  Adjustment disorder with emotional disturbance: -Restarted Risperdal 1 mg BID -Restarted Cogentin 0.5 mg BID  Activity:  as tolerated Diet:  heart healthy diet  Disposition: discharge home Nanine MeansLORD, JAMISON, NP 11/26/2018, 9:47 AM  Patient was seen and staffed. Agree with assessment and plan.

## 2018-11-27 ENCOUNTER — Emergency Department (HOSPITAL_COMMUNITY)
Admission: EM | Admit: 2018-11-27 | Discharge: 2018-11-27 | Disposition: A | Discharge status: Home / Self Care | Payer: Self-pay | Attending: Emergency Medicine | Admitting: Emergency Medicine

## 2018-11-27 ENCOUNTER — Encounter (HOSPITAL_COMMUNITY): Payer: Self-pay | Admitting: Emergency Medicine

## 2018-11-27 ENCOUNTER — Other Ambulatory Visit: Payer: Self-pay

## 2018-11-27 DIAGNOSIS — E7800 Pure hypercholesterolemia, unspecified: Secondary | ICD-10-CM | POA: Insufficient documentation

## 2018-11-27 DIAGNOSIS — Z79899 Other long term (current) drug therapy: Secondary | ICD-10-CM | POA: Insufficient documentation

## 2018-11-27 DIAGNOSIS — Z86711 Personal history of pulmonary embolism: Secondary | ICD-10-CM | POA: Insufficient documentation

## 2018-11-27 DIAGNOSIS — R443 Hallucinations, unspecified: Secondary | ICD-10-CM

## 2018-11-27 DIAGNOSIS — Z760 Encounter for issue of repeat prescription: Secondary | ICD-10-CM | POA: Insufficient documentation

## 2018-11-27 DIAGNOSIS — R51 Headache: Secondary | ICD-10-CM | POA: Insufficient documentation

## 2018-11-27 DIAGNOSIS — F17210 Nicotine dependence, cigarettes, uncomplicated: Secondary | ICD-10-CM | POA: Insufficient documentation

## 2018-11-27 DIAGNOSIS — R440 Auditory hallucinations: Secondary | ICD-10-CM | POA: Insufficient documentation

## 2018-11-27 DIAGNOSIS — Z86718 Personal history of other venous thrombosis and embolism: Secondary | ICD-10-CM | POA: Insufficient documentation

## 2018-11-27 DIAGNOSIS — Z590 Homelessness: Secondary | ICD-10-CM | POA: Insufficient documentation

## 2018-11-27 MED ORDER — RISPERIDONE 1 MG PO TABS
1.0000 mg | ORAL_TABLET | Freq: Once | ORAL | Status: AC
  Administered 2018-11-27: 1 mg via ORAL
  Filled 2018-11-27: qty 1

## 2018-11-27 MED ORDER — BENZTROPINE MESYLATE 1 MG PO TABS
0.5000 mg | ORAL_TABLET | Freq: Once | ORAL | Status: AC
  Administered 2018-11-27: 0.5 mg via ORAL
  Filled 2018-11-27: qty 1

## 2018-11-27 MED ORDER — RISPERIDONE 1 MG PO TABS: 1.0000 mg | ORAL_TABLET | Freq: Two times a day (BID) | ORAL | 0 refills | Status: DC

## 2018-11-27 MED ORDER — BENZTROPINE MESYLATE 0.5 MG PO TABS: 0.5000 mg | ORAL_TABLET | Freq: Two times a day (BID) | ORAL | 0 refills | Status: DC

## 2018-11-27 NOTE — ED Triage Notes (Signed)
Pt. Stated, Im hearing voices today because Cole Barrett not had any in 2 weeks. I just got out of jail and it was not in my bag when I left. I got out 2 weeks ago and I had it in jail but not since I got out.

## 2018-11-27 NOTE — ED Triage Notes (Signed)
Pt reports having headache and wanting some pain pills for it. Pt states headache started 1hr ago. Pt eating chips prior to triage.

## 2018-11-27 NOTE — ED Provider Notes (Signed)
[name] EMERGENCY DEPARTMENT Provider Note   CSN: [id number] Arrival date & time: 11/27/18  [id number]     History   Chief Complaint Chief Complaint  Patient presents with  . Hallucinations    hearing voices  . Medication Refill    HPI Jacquelin HawkingChristopher Adam Berzins is a 32 y.o. male.  HPI   Patient is a 32 year old male with a history of homelessness, hyperlipidemia, paranoid behavior, schizophrenia, who presents the emergency department today requesting a medication refill.  Patient states he has a long history of hallucinations and had previously been taking medication for this however he became incarcerated and later lost his medications.  He presented to [facility] ED several days ago for evaluation of his hallucinations.  He was cleared by psychiatry at that time and was discharged with a prescription for medications and instructions to follow-up with [facility].  He states that he lost his discharge paperwork and needs a refill for the medications that he was given so that he can follow[facility].  He reports hearing hallucinations telling him to kill himself.  He states that he has heard these hallucinations for a long time and they are not abnormal for him.  He has no intention of harming himself and he is not actually having suicidal thoughts or intentions.  He denies homicidal ideations.  No visual hallucinations.  He denies any medical complaints.  Past Medical History:  Diagnosis Date  . Homelessness   . Hypercholesterolemia   . Mental disorder   . Paranoid behavior (HCC)   . PE (pulmonary embolism)   . Schizophrenia [facility](HCC)     Patient Active Problem List   Diagnosis Date Noted  . 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 Alcohol abuse   . Auditory hallucinations   . Alcohol abuse with alcohol-induced mood disorder (HCC) 09/08/2016  . Homelessness 09/03/2016  . Person feigning illness 09/03/2016  . Brief  psychotic disorder (HCC) 08/29/2016  . Cannabis use disorder, mild, abuse 08/29/2016  . Pulmonary emboli (HCC) 07/13/2016  . Adjustment disorder with emotional disturbance 03/12/2014  . 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 Acute left flank pain 01/02/2014  . Pulmonary embolism, bilateral (HCC) 01/02/2014    History reviewed. No pertinent surgical history.      Home Medications    Prior to Admission medications   Medication Sig Start Date End Date Taking? Authorizing Provider  benztropine (COGENTIN) 0.5 MG tablet Take 1 tablet (0.5 mg total) by mouth 2 (two) times daily for 7 days. 11/27/18 12/04/18  Alysia Scism S, PA-C  risperiDONE (RISPERDAL) 1 MG tablet Take 1 tablet (1 mg total) by mouth 2 (two) times daily for 7 days. 11/27/18 12/04/18  Caeley Dohrmann S, PA-C    Family History Family History  Problem Relation Age of Onset  . Hypertension Mother     Social History Social History   Tobacco Use  . Smoking status: Current Every Day Smoker    Packs/day: 0.50    Types: Cigarettes  . Smokeless tobacco: Never Used  Substance Use Topics  . Alcohol use: Yes  . Drug use: Yes    Types: Marijuana    Comment: Last used:  10/02/18     Allergies   Haldol [haloperidol]   Review of Systems Review of Systems  Constitutional: Negative for fever.  HENT: Negative for congestion.   Eyes: Negative for visual disturbance.  Respiratory: Negative for shortness of breath.   Cardiovascular: Negative for  chest pain.  Gastrointestinal: Negative for abdominal pain and diarrhea.  Musculoskeletal: Negative for back pain.  Neurological: Negative for headaches.  Psychiatric/Behavioral: Positive for hallucinations.       No SI or HI, No visual hallucinations     Physical Exam Updated Vital Signs BP 135/74 (BP Location: Right Arm)   Pulse 92   Temp 98.1 F (36.7 C) (Oral)   Resp 18   Ht 5\' 10"  (1.778 m)   Wt 102 kg   SpO2 100%   BMI 32.27 kg/m   Physical  Exam Constitutional:      General: He is not in acute distress.    Appearance: He is well-developed.  Eyes:     Conjunctiva/sclera: Conjunctivae normal.  Cardiovascular:     Rate and Rhythm: Normal rate and regular rhythm.     Heart sounds: Normal heart sounds. No murmur.  Pulmonary:     Effort: Pulmonary effort is normal.     Breath sounds: Normal breath sounds.  Abdominal:     Palpations: Abdomen is soft.     Tenderness: There is no abdominal tenderness.  Skin:    General: Skin is warm and dry.  Neurological:     Mental Status: He is alert and oriented to person, place, and time.  Psychiatric:        Behavior: Behavior normal.        Thought Content: Thought content normal.        Judgment: Judgment normal.     Comments: Flat affect      ED Treatments / Results  Labs (all labs ordered are listed, but only abnormal results are displayed) Labs Reviewed - No data to display  EKG None  Radiology No results found.  Procedures Procedures (including critical care time)  Medications Ordered in ED Medications  benztropine (COGENTIN) tablet 0.5 mg (0.5 mg Oral Given 11/27/18 0911)  risperiDONE (RISPERDAL) tablet 1 mg (1 mg Oral Given 11/27/18 0911)     Initial Impression / Assessment and Plan / ED Course  I have reviewed the triage vital signs and the nursing notes.  Pertinent labs & imaging results that were available during my care of the patient were reviewed by me and considered in my medical decision making (see chart for details).    Final Clinical Impressions(s) / ED Diagnoses   Final diagnoses:  Encounter for medication refill  Hallucinations   Patient presented the ED today requesting a medication refill.  States he was seen at the [facility] ED several days ago and given a prescription for his psychiatric medications however has been unable to fill them because he states he lost the prescription.  He states he plans to follow-up with [facility] however he  would like to have a refill of his prescription so that he can do so.  States he has been hearing voices for some time now telling him to kill himself.  He denies suicidal or homicidal intention.  Denies visual hallucinations.  Records reviewed from prior visit at [facility] ED.  Patient was seen and evaluated by psychiatry at that time.  He was discharged with prescription for benztropine and risperidone.  Patient requesting dose of those medications prior to being discharged.  Will give 1 dose and refill his prescriptions for 1 week.  He was advised to follow[facility] with [facility] which he states he will do.  Advised to return to the ER for new or worsening symptoms the meantime.  He voiced understanding the plan reasons return.  All questions answered.   ED Discharge Orders         Ordered    benztropine (COGENTIN) 0.5 MG tablet  2 times daily     11/27/18 0841    risperiDONE (RISPERDAL) 1 MG tablet  2 times daily     11/27/18 0841           Karrie Meres, PA-C 11/27/18 0957    Maia Plan, MD 11/27/18 [phone]

## 2018-11-27 NOTE — ED Triage Notes (Signed)
Pt. Stated, I was at [facility] and trying to get into Behavior Health

## 2018-11-27 NOTE — Discharge Instructions (Signed)
Please fill the prescriptions at [facility] and follow up with them in regards to your behavioral health needs.  Return to the emergency department for any suicidal or homicidal thoughts. Return for any new or worsening symptoms.

## 2018-11-28 ENCOUNTER — Emergency Department (HOSPITAL_COMMUNITY)
Admission: EM | Admit: 2018-11-28 | Discharge: 2018-11-28 | Disposition: A | Discharge status: Home / Self Care | Payer: Self-pay | Attending: Emergency Medicine | Admitting: Emergency Medicine

## 2018-11-28 DIAGNOSIS — R51 Headache: Secondary | ICD-10-CM

## 2018-11-28 DIAGNOSIS — R519 Headache, unspecified: Secondary | ICD-10-CM

## 2018-11-28 MED ORDER — IBUPROFEN 800 MG PO TABS
800.0000 mg | ORAL_TABLET | Freq: Once | ORAL | Status: AC
  Administered 2018-11-28: 800 mg via ORAL
  Filled 2018-11-28: qty 1

## 2018-11-28 NOTE — Discharge Instructions (Addendum)
Ibuprofen 600 mg every 6 hours as needed for pain.

## 2018-11-28 NOTE — ED Provider Notes (Signed)
[facility]-EMERGENCY DEPT Provider Note   CSN: [id number] Arrival date & time: 11/27/18  2231     History   Chief Complaint Chief Complaint  Patient presents with  . Headache    HPI Cole Barrett is a 33 y.o. male.  Patient is a 33 year old male with history of schizophrenia, homelessness, and paranoia.  He presents today for evaluation of headache.  This started approximately 30 minutes prior to coming here.  He states he did not have any medications at home to take so he presented here.  He denies any visual disturbances, numbness, or tingling.  His headache is noted to the top of his head.  The history is provided by the patient.  Headache   This is a new problem. The current episode started less than 1 hour ago. The problem occurs constantly. The problem has not changed since onset.The headache is associated with nothing. The pain is located in the parietal region. The pain is moderate. The pain does not radiate. Pertinent negatives include no fever. He has tried nothing for the symptoms.    Past Medical History:  Diagnosis Date  . Homelessness   . Hypercholesterolemia   . Mental disorder   . Paranoid behavior (HCC)   . PE (pulmonary embolism)   . Schizophrenia [facility](HCC)     Patient Active Problem List   Diagnosis Date Noted  . 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 Alcohol abuse   . Auditory hallucinations   . Alcohol abuse with alcohol-induced mood disorder (HCC) 09/08/2016  . Homelessness 09/03/2016  . Person feigning illness 09/03/2016  . 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 Cannabis use disorder, mild, abuse 08/29/2016  . Pulmonary emboli (HCC) 07/13/2016  . Adjustment disorder with emotional disturbance 03/12/2014  . 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 Acute left flank pain 01/02/2014  . Pulmonary embolism, bilateral (HCC) 01/02/2014    History  reviewed. No pertinent surgical history.      Home Medications    Prior to Admission medications   Medication Sig Start Date End Date Taking? Authorizing Provider  benztropine (COGENTIN) 0.5 MG tablet Take 1 tablet (0.5 mg total) by mouth 2 (two) times daily for 7 days. 11/27/18 12/04/18  Couture, Cortni S, PA-C  risperiDONE (RISPERDAL) 1 MG tablet Take 1 tablet (1 mg total) by mouth 2 (two) times daily for 7 days. 11/27/18 12/04/18  Couture, Cortni S, PA-C    Family History Family History  Problem Relation Age of Onset  . Hypertension Mother     Social History Social History   Tobacco Use  . Smoking status: Current Every Day Smoker    Packs/day: 0.50    Types: Cigarettes  . Smokeless tobacco: Never Used  Substance Use Topics  . Alcohol use: Yes  . Drug use: Yes    Types: Marijuana    Comment: Last used:  10/02/18     Allergies   Haldol [haloperidol]   Review of Systems Review of Systems  Constitutional: Negative for fever.  Neurological: Positive for headaches.  All other systems reviewed and are negative.    Physical Exam Updated Vital Signs BP 129/79 (BP Location: Right Arm)   Pulse 83   Temp 98.3 F (36.8 C) (Oral)   Resp 17   Ht 5\' 10"  (1.778 m)   Wt 95.3 kg   SpO2 97%   BMI 30.13 kg/m   Physical Exam Vitals signs and nursing note reviewed.  Constitutional:  General: He is not in acute distress.    Appearance: He is well-developed. He is not diaphoretic.  HENT:     Head: Normocephalic and atraumatic.  Eyes:     Extraocular Movements: Extraocular movements intact.     Pupils: Pupils are equal, round, and reactive to light.  Neck:     Musculoskeletal: Normal range of motion and neck supple.  Cardiovascular:     Rate and Rhythm: Normal rate and regular rhythm.     Heart sounds: No murmur. No friction rub.  Pulmonary:     Effort: Pulmonary effort is normal. No respiratory distress.     Breath sounds: Normal breath sounds. No wheezing or  rales.  Abdominal:     General: Bowel sounds are normal. There is no distension.     Palpations: Abdomen is soft.     Tenderness: There is no abdominal tenderness.  Musculoskeletal: Normal range of motion.  Skin:    General: Skin is warm and dry.  Neurological:     Mental Status: He is alert and oriented to person, place, and time.     Cranial Nerves: No cranial nerve deficit or dysarthria.     Coordination: Coordination normal.      ED Treatments / Results  Labs (all labs ordered are listed, but only abnormal results are displayed) Labs Reviewed - No data to display  EKG None  Radiology No results found.  Procedures Procedures (including critical care time)  Medications Ordered in ED Medications  ibuprofen (ADVIL,MOTRIN) tablet 800 mg (has no administration in time range)     Initial Impression / Assessment and Plan / ED Course  I have reviewed the triage vital signs and the nursing notes.  Pertinent labs & imaging results that were available during my care of the patient were reviewed by me and considered in my medical decision making (see chart for details).  Patient with 30-minute history of headache.  His neurologic exam is nonfocal and the patient has not taken any medications at all for relief.  He has a history of psychiatric issues and homelessness, and I doubt has the means to purchase over-the-counter medications.  I assume this is the reason he came here today.  Clinically, he appears well and I do not feel as though any imaging or other studies are indicated.  He will be given a dose of Motrin and encouraged to purchase this and take this at home the next time he experiences this.  Final Clinical Impressions(s) / ED Diagnoses   Final diagnoses:  None    ED Discharge Orders    None       Geoffery Lyons, MD 11/28/18 [phone]

## 2018-12-06 ENCOUNTER — Encounter (HOSPITAL_COMMUNITY): Payer: Self-pay | Admitting: Emergency Medicine

## 2018-12-06 ENCOUNTER — Ambulatory Visit (HOSPITAL_COMMUNITY)
Admission: RE | Admit: 2018-12-06 | Discharge: 2018-12-06 | Disposition: A | Discharge status: Home / Self Care | Payer: Self-pay | Attending: Psychiatry | Admitting: Psychiatry

## 2018-12-06 ENCOUNTER — Emergency Department (HOSPITAL_COMMUNITY)
Admission: EM | Admit: 2018-12-06 | Discharge: 2018-12-06 | Disposition: A | Discharge status: Home / Self Care | Payer: Self-pay | Attending: Emergency Medicine | Admitting: Emergency Medicine

## 2018-12-06 DIAGNOSIS — Z86718 Personal history of other venous thrombosis and embolism: Secondary | ICD-10-CM | POA: Insufficient documentation

## 2018-12-06 DIAGNOSIS — E7800 Pure hypercholesterolemia, unspecified: Secondary | ICD-10-CM | POA: Insufficient documentation

## 2018-12-06 DIAGNOSIS — Z760 Encounter for issue of repeat prescription: Secondary | ICD-10-CM | POA: Insufficient documentation

## 2018-12-06 DIAGNOSIS — Z590 Homelessness: Secondary | ICD-10-CM | POA: Insufficient documentation

## 2018-12-06 DIAGNOSIS — F209 Schizophrenia, unspecified: Secondary | ICD-10-CM | POA: Insufficient documentation

## 2018-12-06 DIAGNOSIS — F17210 Nicotine dependence, cigarettes, uncomplicated: Secondary | ICD-10-CM | POA: Insufficient documentation

## 2018-12-06 DIAGNOSIS — F600 Paranoid personality disorder: Secondary | ICD-10-CM | POA: Insufficient documentation

## 2018-12-06 DIAGNOSIS — Z888 Allergy status to other drugs, medicaments and biological substances status: Secondary | ICD-10-CM | POA: Insufficient documentation

## 2018-12-06 MED ORDER — RISPERIDONE 1 MG PO TABS: 1.0000 mg | ORAL_TABLET | Freq: Two times a day (BID) | ORAL | 0 refills | Status: DC

## 2018-12-06 MED ORDER — BENZTROPINE MESYLATE 1 MG PO TABS
0.5000 mg | ORAL_TABLET | Freq: Once | ORAL | Status: AC
  Administered 2018-12-06: 0.5 mg via ORAL
  Filled 2018-12-06: qty 1

## 2018-12-06 MED ORDER — RISPERIDONE 1 MG PO TABS
1.0000 mg | ORAL_TABLET | Freq: Once | ORAL | Status: AC
  Administered 2018-12-06: 1 mg via ORAL
  Filled 2018-12-06: qty 1

## 2018-12-06 MED ORDER — BENZTROPINE MESYLATE 0.5 MG PO TABS: 0.5000 mg | ORAL_TABLET | Freq: Two times a day (BID) | ORAL | 0 refills | Status: DC

## 2018-12-06 NOTE — Discharge Instructions (Addendum)
Medicine Refill at the Emergency Department We have refilled your medicine today. However, it is best for you to get refills through your primary health care provider's office. In the future, please plan ahead so you do not need to get refills from the emergency department. If we refilled a medicine that you take daily for a chronic problem (maintenance medicine), you may have received only enough to get you by until you are able to see your regular health care provider. This information is not intended to replace advice given to you by your health care provider. Make sure you discuss any questions you have with your health care provider. 

## 2018-12-06 NOTE — ED Provider Notes (Signed)
[name] EMERGENCY DEPARTMENT Provider Note   CSN: [id number] Arrival date & time: 12/06/18  0003     History   Chief Complaint Chief Complaint  Patient presents with  . Medication Refill    HPI Cole Barrett is a 33 y.o. male with a past medical history of schizophrenia and homelessness.  The patient presents saying that he wants some medication because of the voices he is hearing and asked for a dose here in the emergency department.  He states he normally gets his medicine at [facility].  He denies suicidal or homicidal ideation.  He does not know what the voices are saying and has no command hallucinations.  HPI  Past Medical History:  Diagnosis Date  . Homelessness   . Hypercholesterolemia   . Mental disorder   . Paranoid behavior (HCC)   . PE (pulmonary embolism)   . Schizophrenia [facility])     Patient Active Problem List   Diagnosis Date Noted  . 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 Alcohol abuse   . Auditory hallucinations   . Alcohol abuse with alcohol-induced mood disorder (HCC) 09/08/2016  . Homelessness 09/03/2016  . Person feigning illness 09/03/2016  . 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 Cannabis use disorder, mild, abuse 08/29/2016  . Pulmonary emboli (HCC) 07/13/2016  . Adjustment disorder with emotional disturbance 03/12/2014  . 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 Acute left flank pain 01/02/2014  . Pulmonary embolism, bilateral (HCC) 01/02/2014    History reviewed. No pertinent surgical history.      Home Medications    Prior to Admission medications   Medication Sig Start Date End Date Taking? Authorizing Provider  benztropine (COGENTIN) 0.5 MG tablet Take 1 tablet (0.5 mg total) by mouth 2 (two) times daily. 12/06/18   Jinan Biggins, Cammy Copa, PA-C  risperiDONE (RISPERDAL) 1 MG tablet Take 1 tablet (1 mg total) by mouth 2 (two) times daily. 12/06/18    Arthor Captain, PA-C    Family History Family History  Problem Relation Age of Onset  . Hypertension Mother     Social History Social History   Tobacco Use  . Smoking status: Current Every Day Smoker    Packs/day: 0.50    Types: Cigarettes  . Smokeless tobacco: Never Used  Substance Use Topics  . Alcohol use: Yes  . Drug use: Yes    Types: Marijuana    Comment: Last used:  10/02/18     Allergies   Haldol [haloperidol]   Review of Systems Review of Systems  Ten systems reviewed and are negative for acute change, except as noted in the HPI.   Physical Exam Updated Vital Signs BP 132/74 (BP Location: Right Arm)   Pulse (!) 103   Temp 97.8 F (36.6 C) (Oral)   Resp 20   SpO2 96%   Physical Exam  Physical Exam  Nursing note and vitals reviewed. Constitutional: He appears well-developed and well-nourished. No distress.  HENT:  Head: Normocephalic and atraumatic.  Eyes: Conjunctivae normal are normal. No scleral icterus.  Neck: Normal range of motion. Neck supple.  Cardiovascular: Normal rate, regular rhythm and normal heart sounds.   Pulmonary/Chest: Effort normal and breath sounds normal. No respiratory distress.  Abdominal: Soft. There is no tenderness.  Musculoskeletal: He exhibits no edema.  Neurological: He is alert.  Skin: Skin is warm and dry. He is not diaphoretic.  Psychiatric: Bizarre affect, patient seems distracted by internal stimuli  ED Treatments / Results  Labs (all labs ordered are listed, but only abnormal results are displayed) Labs Reviewed - No data to display  EKG None  Radiology No results found.  Procedures Procedures (including critical care time)  Medications Ordered in ED Medications  risperiDONE (RISPERDAL) tablet 1 mg (has no administration in time range)  benztropine (COGENTIN) tablet 0.5 mg (has no administration in time range)     Initial Impression / Assessment and Plan / ED Course  I have reviewed the  triage vital signs and the nursing notes.  Pertinent labs & imaging results that were available during my care of the patient were reviewed by me and considered in my medical decision making (see chart for details).     Patient given a dose of his medication and refills.  He is advised to follow-up with [facility].  He does not appear to be a danger to himself or others.  Appears appropriate for discharge at this time  Final Clinical Impressions(s) / ED Diagnoses   Final diagnoses:  Medication refill    ED Discharge Orders         Ordered    benztropine (COGENTIN) 0.5 MG tablet  2 times daily     12/06/18 0621    risperiDONE (RISPERDAL) 1 MG tablet  2 times daily     12/06/18 [id number]           Arthor CaptainHarris, Markian Glockner, PA-C 12/06/18 [id number]    Dione BoozeGlick, David, MD 12/06/18 [phone]

## 2018-12-06 NOTE — ED Triage Notes (Signed)
Pt wants a refill on his psychiatric medications.  Only reports of auditory hallucinations, can't understand what the voices are saying.  Calm and cooperative in triage.

## 2018-12-06 NOTE — ED Notes (Signed)
Called pt to update vitals but no answer in waiting room

## 2018-12-06 NOTE — ED Notes (Signed)
Called pt X 2 no answer

## 2018-12-06 NOTE — ED Notes (Signed)
Pt verbalized understanding of discharge paperwork and prescriptions.  °

## 2018-12-07 ENCOUNTER — Other Ambulatory Visit: Payer: Self-pay

## 2018-12-07 ENCOUNTER — Encounter (HOSPITAL_COMMUNITY): Payer: Self-pay | Admitting: *Deleted

## 2018-12-07 ENCOUNTER — Emergency Department (HOSPITAL_COMMUNITY)
Admission: EM | Admit: 2018-12-07 | Discharge: 2018-12-07 | Disposition: A | Discharge status: Home / Self Care | Payer: Self-pay | Attending: Emergency Medicine | Admitting: Emergency Medicine

## 2018-12-07 DIAGNOSIS — F17210 Nicotine dependence, cigarettes, uncomplicated: Secondary | ICD-10-CM | POA: Insufficient documentation

## 2018-12-07 DIAGNOSIS — R440 Auditory hallucinations: Secondary | ICD-10-CM | POA: Insufficient documentation

## 2018-12-07 MED ORDER — RISPERIDONE 1 MG PO TABS
1.0000 mg | ORAL_TABLET | Freq: Once | ORAL | Status: AC
  Administered 2018-12-07: 1 mg via ORAL
  Filled 2018-12-07: qty 1

## 2018-12-07 MED ORDER — BENZTROPINE MESYLATE 0.5 MG PO TABS
0.5000 mg | ORAL_TABLET | Freq: Once | ORAL | Status: AC
  Administered 2018-12-07: 0.5 mg via ORAL
  Filled 2018-12-07: qty 1

## 2018-12-07 NOTE — ED Notes (Signed)
Bed: [facility] Expected date:  Expected time:  Means of arrival:  Comments: 

## 2018-12-07 NOTE — ED Notes (Signed)
Pt asleep snoring.

## 2018-12-07 NOTE — ED Triage Notes (Signed)
Pt stated "I need a little bit of risperdal to help with these voices.  Can you help me out?  I can't go to [facility] until next week."  Pt denies SI/HI.  Pt c/o auditory hallucinations that tell him "they're going to kill me."  Pt admits to use of marijuana yesterday.

## 2018-12-07 NOTE — ED Provider Notes (Signed)
[facility]-EMERGENCY DEPT Provider Note: Cole Dell, MD, FACEP  CSN: [id number] MRN: [id number] ARRIVAL: 12/07/18 at 0123 ROOM: [facility]4/[facility]   CHIEF COMPLAINT  Medication Refill   HISTORY OF PRESENT ILLNESS  12/07/18 3:00 AM Cole Barrett is a 33 y.o. male with a history of schizophrenia.  He is here complaining of voices that have been threatening to kill him for the past several days.  He is out of his usual Risperdal 1 mg twice daily.  He denies suicidal or homicidal ideation.  He denies other complaint.  It is noted that he was seen yesterday and reportedly given prescriptions for benztropine and Risperdal.  He has an appointment with [facility] in about 5 days.   Past Medical History:  Diagnosis Date  . Homelessness   . Hypercholesterolemia   . Mental disorder   . Paranoid behavior (HCC)   . PE (pulmonary embolism)   . Schizophrenia (HCC)     History reviewed. No pertinent surgical history.  Family History  Problem Relation Age of Onset  . Hypertension Mother     Social History   Tobacco Use  . Smoking status: Current Every Day Smoker    Packs/day: 0.50    Types: Cigarettes  . Smokeless tobacco: Never Used  Substance Use Topics  . Alcohol use: Yes  . Drug use: Yes    Types: Marijuana    Comment: Last used 12/06/2018    Prior to Admission medications   Medication Sig Start Date End Date Taking? Authorizing Provider  benztropine (COGENTIN) 0.5 MG tablet Take 1 tablet (0.5 mg total) by mouth 2 (two) times daily. 12/06/18  Yes Harris, Abigail, PA-C  risperiDONE (RISPERDAL) 1 MG tablet Take 1 tablet (1 mg total) by mouth 2 (two) times daily. 12/06/18  Yes Harris, Cammy Copa, PA-C    Allergies Haldol [haloperidol]   REVIEW OF SYSTEMS  Negative except as noted here or in the History of Present Illness.   PHYSICAL EXAMINATION  Initial Vital Signs Blood pressure 112/69, pulse 82, temperature 97.8 F (36.6 C), temperature source Oral, resp. rate 16, height 5'  10" (1.778 m), weight 99.8 kg, SpO2 98 %.  Examination General: Well-developed, well-nourished male in no acute distress; appearance consistent with age of record HENT: normocephalic; atraumatic Eyes: pupils equal, round and reactive to light; extraocular muscles intact Neck: supple Heart: regular rate and rhythm Lungs: clear to auscultation bilaterally Abdomen: soft; nondistended; nontender; bowel sounds present Extremities: No deformity; full range of motion Neurologic: Awake, alert; motor function intact in all extremities and symmetric; no facial droop Skin: Warm and dry Psychiatric: Calm, cooperative; auditory hallucinations   RESULTS  Summary of this visit's results, reviewed by myself:   EKG Interpretation  Date/Time:    Ventricular Rate:    PR Interval:    QRS Duration:   QT Interval:    QTC Calculation:   R Axis:     Text Interpretation:        Laboratory Studies: No results found for this or any previous visit (from the past 24 hour(s)). Imaging Studies: No results found.  ED COURSE and MDM  Nursing notes and initial vitals signs, including pulse oximetry, reviewed.  Vitals:   12/07/18 0156 12/07/18 0158  BP: 112/69   Pulse: 82   Resp: 16   Temp: 97.8 F (36.6 C)   TempSrc: Oral   SpO2: 98%   Weight:  99.8 kg  Height:  5\' 10"  (1.778 m)    PROCEDURES    ED DIAGNOSES  ICD-10-CM   1. Auditory hallucinations R44.0        Asheton Viramontes, Jonny Ruiz, MD 12/07/18 [phone]

## 2018-12-07 NOTE — BH Assessment (Signed)
Assessment Note  Cole Barrett is an 33 y.o., single male. Pt presented to [facility] voluntarily and unaccompanied due to experiencing ongoing AH that are telling him that they are going to kill him. Pt shares that he has been experiencing these AH for over two years and that he hears 1-2 voices telling him this. Pt shares that he currently works at [name] in [place], that he is single and lives independently in a home, and that he attends [facility] for psychiatric services. Pt denies SI, HI, VH, and SA. Pt reports sporadic use of marijuana, but stated that he has not used recently.  He denies any SI attempts, a plan, intent, and states that the last time he was hospitalized was at [facility] in 2018. He shares his appetite is good and that he has been sleeping approximately 8 hours per night.  Pt denies SA and any involvement in the legal system. He denies any acces to weapons. Pt declined any family history of SI, MH, or SA or any abuse inflicted onto him in the past or currently.  He shares he is able to independently complete his ADLs.  Pt oriented to person, place and situation. Pt presented alert, dressed appropriately and groomed. Pt spoke clearly, coherently and did not seem to be under the influence of any substances. Pt made good eye contact and answered questions appropriately. Pt presented cooperative and calm and open to the assessment process. Pt presented with no impairments of remote or recent memory that could be detected due to having no collateral.   Diagnosis: Per Hx: F20.9, Schizophrenia  Past Medical History:  Past Medical History:  Diagnosis Date  . Homelessness   . Hypercholesterolemia   . Mental disorder   . Paranoid behavior (HCC)   . PE (pulmonary embolism)   . Schizophrenia (HCC)     No past surgical history on file.  Family History:  Family History  Problem Relation Age of Onset  . Hypertension Mother     Social History:  reports that he has  been smoking cigarettes. He has been smoking about 0.50 packs per day. He has never used smokeless tobacco. He reports current alcohol use. He reports current drug use. Drug: Marijuana.  Additional Social History:  Alcohol / Drug Use Pain Medications: SEE MAR.  Prescriptions: Pt reports that he is not aware of the names of his MH medications.  Over the Counter: SEE MAR. History of alcohol / drug use?: Yes Longest period of sobriety (when/how long): Pt reports sporadic use.  Substance #1 Name of Substance 1: Marijuana  1 - Age of First Use: Pt did not answer.  1 - Amount (size/oz): Pt did not answer.  1 - Frequency: Sporadic Use  1 - Duration: Pt did not answer.  1 - Last Use / Amount: Pt did not answer.   CIWA:   COWS:    Allergies:  Allergies  Allergen Reactions  . Haldol [Haloperidol] Shortness Of Breath    Home Medications: (Not in a hospital admission)   OB/GYN Status:  No LMP for male patient.  General Assessment Data Location of Assessment: [name] Assessment Services TTS Assessment: In system Is this a Tele or Face-to-Face Assessment?: Face-to-Face Is this an Initial Assessment or a Re-assessment for this encounter?: Initial Assessment Patient Accompanied by:: N/A Language Other than English: No Living Arrangements: Other (Comment)(Pt reports living in an apartment. ) What gender do you identify as?: Male Marital status: Single Maiden name: N/A Pregnancy Status: No  Living Arrangements: Alone Can pt return to current living arrangement?: Yes Admission Status: Voluntary Is patient capable of signing voluntary admission?: Yes Referral Source: Self/Family/Friend Insurance type: Self Pay   Medical Screening Exam [facility] Walk-in ONLY) Medical Exam completed: Yes  Crisis Care Plan Living Arrangements: Alone Legal Guardian: Other:(Self) Name of Psychiatrist: [facility]. Name of Therapist: Pt denied.   Education Status Is patient currently in school?: No Is the patient  employed, unemployed or receiving disability?: Employed(Pt reports working at [employer]. )  Risk to self with the past 6 months Suicidal Ideation: No Has patient been a risk to self within the past 6 months prior to admission? : No Suicidal Intent: No Has patient had any suicidal intent within the past 6 months prior to admission? : No Is patient at risk for suicide?: No Suicidal Plan?: No Has patient had any suicidal plan within the past 6 months prior to admission? : No Access to Means: No What has been your use of drugs/alcohol within the last 12 months?: Pt denied.  Previous Attempts/Gestures: No How many times?: 0 Other Self Harm Risks: Denied.  Triggers for Past Attempts: None known Intentional Self Injurious Behavior: None Family Suicide History: No Recent stressful life event(s): Other (Comment)(Pt denied. ) Persecutory voices/beliefs?: Yes Depression: No Depression Symptoms: (Pt denied. ) Substance abuse history and/or treatment for substance abuse?: No Suicide prevention information given to non-admitted patients: Not applicable  Risk to Others within the past 6 months Homicidal Ideation: No Does patient have any lifetime risk of violence toward others beyond the six months prior to admission? : No Thoughts of Harm to Others: No Current Homicidal Intent: No Current Homicidal Plan: No Access to Homicidal Means: No Identified Victim: Denied History of harm to others?: No Assessment of Violence: None Noted Violent Behavior Description: Denied Does patient have access to weapons?: No Criminal Charges Pending?: Yes Describe Pending Criminal Charges: Trespassing Does patient have a court date: Yes Court Date: 12/10/18 Is patient on probation?: No  Psychosis Hallucinations: Auditory(Pt reports hearing voices. ) Delusions: (Unsure due to no collateral information. )  Mental Status Report Appearance/Hygiene: Poor hygiene, Layered clothes Eye Contact: Good Motor  Activity: Freedom of movement Speech: Logical/coherent Level of Consciousness: Alert Mood: Pleasant Affect: Blunted Anxiety Level: None Thought Processes: Coherent, Relevant Judgement: Unimpaired Orientation: Person, Place, Time, Situation, Appropriate for developmental age Obsessive Compulsive Thoughts/Behaviors: Unable to Assess(Unable to assess due to no collateral information. )  Cognitive Functioning Concentration: Normal Memory: Recent Intact, Remote Intact Is patient IDD: No Insight: Poor Impulse Control: Good Appetite: Good Have you had any weight changes? : No Change Sleep: No Change Total Hours of Sleep: 8 Vegetative Symptoms: None  ADLScreening [facility] Assessment Services) Patient's cognitive ability adequate to safely complete daily activities?: Yes Patient able to express need for assistance with ADLs?: Yes Independently performs ADLs?: Yes (appropriate for developmental age)  Prior Inpatient Therapy Prior Inpatient Therapy: Yes Prior Therapy Dates: 2018 Prior Therapy Facilty/Provider(s): [facility]; [facility]  Reason for Treatment: Psychosis.   Prior Outpatient Therapy Prior Outpatient Therapy: Yes Prior Therapy Dates: Current Prior Therapy Facilty/Provider(s): [facility].  Reason for Treatment: Medication management. Does patient have an ACCT team?: No Does patient have Intensive In-House Services?  : No Does patient have [facility] services? : Yes Does patient have P4CC services?: No  ADL Screening (condition at time of admission) Patient's cognitive ability adequate to safely complete daily activities?: Yes Is the patient deaf or have difficulty hearing?: No Does the patient have difficulty seeing,  even when wearing glasses/contacts?: No Does the patient have difficulty concentrating, remembering, or making decisions?: No Patient able to express need for assistance with ADLs?: Yes Does the patient have difficulty dressing or bathing?: No Independently performs  ADLs?: Yes (appropriate for developmental age) Does the patient have difficulty walking or climbing stairs?: No Weakness of Legs: None Weakness of Arms/Hands: None  Home Assistive Devices/Equipment Home Assistive Devices/Equipment: None  Therapy Consults (therapy consults require a physician order) PT Evaluation Needed: No OT Evalulation Needed: No SLP Evaluation Needed: No Abuse/Neglect Assessment (Assessment to be complete while patient is alone) Abuse/Neglect Assessment Can Be Completed: Yes Physical Abuse: Denies Verbal Abuse: Denies Sexual Abuse: Denies Exploitation of patient/patient's resources: Denies Self-Neglect: Denies Values / Beliefs Cultural Requests During Hospitalization: None Spiritual Requests During Hospitalization: None Consults Spiritual Care Consult Needed: No Social Work Consult Needed: No Merchant navy officerAdvance Directives (For Healthcare) Does Patient Have a Medical Advance Directive?: No Would patient like information on creating a medical advance directive?: No - Patient declined          Disposition: Per Donell SievertSpencer Simon, PA; [facility] due to internal stimuli (AH). Pt to be observed and see psychiatrist in the morning.  Disposition Initial Assessment Completed for this Encounter: Yes Disposition of Patient: (Per Donell SievertSpencer Simon; PA)   Chesley NoonMiriam Gracelynn Bircher, M.S., [facility], LCAS Triage Specialist [facility] 12/07/2018 12:33 AM

## 2018-12-14 ENCOUNTER — Emergency Department (HOSPITAL_COMMUNITY)
Admission: EM | Admit: 2018-12-14 | Discharge: 2018-12-14 | Disposition: A | Discharge status: Home / Self Care | Payer: Self-pay | Attending: Emergency Medicine | Admitting: Emergency Medicine

## 2018-12-14 ENCOUNTER — Encounter (HOSPITAL_COMMUNITY): Payer: Self-pay | Admitting: Emergency Medicine

## 2018-12-14 DIAGNOSIS — Z760 Encounter for issue of repeat prescription: Secondary | ICD-10-CM | POA: Insufficient documentation

## 2018-12-14 DIAGNOSIS — F17210 Nicotine dependence, cigarettes, uncomplicated: Secondary | ICD-10-CM | POA: Insufficient documentation

## 2018-12-14 DIAGNOSIS — Z590 Homelessness: Secondary | ICD-10-CM | POA: Insufficient documentation

## 2018-12-14 DIAGNOSIS — F209 Schizophrenia, unspecified: Secondary | ICD-10-CM | POA: Insufficient documentation

## 2018-12-14 DIAGNOSIS — R443 Hallucinations, unspecified: Secondary | ICD-10-CM

## 2018-12-14 DIAGNOSIS — Z86711 Personal history of pulmonary embolism: Secondary | ICD-10-CM | POA: Insufficient documentation

## 2018-12-14 LAB — RAPID URINE DRUG SCREEN, HOSP PERFORMED
Amphetamines: NOT DETECTED
BENZODIAZEPINES: NOT DETECTED
Barbiturates: NOT DETECTED
Cocaine: NOT DETECTED
Opiates: NOT DETECTED
Tetrahydrocannabinol: NOT DETECTED

## 2018-12-14 LAB — CBC
HCT: 39.7 % (ref 39.0–52.0)
Hemoglobin: 12.7 g/dL — ABNORMAL LOW (ref 13.0–17.0)
MCH: 27.9 pg (ref 26.0–34.0)
MCHC: 32 g/dL (ref 30.0–36.0)
MCV: 87.1 fL (ref 80.0–100.0)
Platelets: 352 10*3/uL (ref 150–400)
RBC: 4.56 MIL/uL (ref 4.22–5.81)
RDW: 13.1 % (ref 11.5–15.5)
WBC: 4.8 10*3/uL (ref 4.0–10.5)
nRBC: 0 % (ref 0.0–0.2)

## 2018-12-14 LAB — COMPREHENSIVE METABOLIC PANEL
ALT: 33 U/L (ref 0–44)
AST: 22 U/L (ref 15–41)
Albumin: 4 g/dL (ref 3.5–5.0)
Alkaline Phosphatase: 38 U/L (ref 38–126)
Anion gap: 12 (ref 5–15)
BUN: 10 mg/dL (ref 6–20)
CHLORIDE: 102 mmol/L (ref 98–111)
CO2: 24 mmol/L (ref 22–32)
Calcium: 8.7 mg/dL — ABNORMAL LOW (ref 8.9–10.3)
Creatinine, Ser: 1.1 mg/dL (ref 0.61–1.24)
GFR calc Af Amer: >60 mL/min (ref >60)
Glucose, Bld: 92 mg/dL (ref 70–99)
Potassium: 3.6 mmol/L (ref 3.5–5.1)
Sodium: 138 mmol/L (ref 135–145)
Total Bilirubin: 0.5 mg/dL (ref 0.3–1.2)
Total Protein: 7.1 g/dL (ref 6.5–8.1)

## 2018-12-14 LAB — ETHANOL: Alcohol, Ethyl (B): 123 mg/dL — ABNORMAL HIGH (ref <10)

## 2018-12-14 LAB — ACETAMINOPHEN LEVEL: Acetaminophen (Tylenol), Serum: <10 ug/mL — ABNORMAL LOW (ref 10–30)

## 2018-12-14 LAB — SALICYLATE LEVEL: Salicylate Lvl: <7 mg/dL (ref 2.8–30.0)

## 2018-12-14 MED ORDER — RISPERIDONE 1 MG PO TABS: 1.0000 mg | ORAL_TABLET | Freq: Two times a day (BID) | ORAL | 0 refills | Status: DC

## 2018-12-14 NOTE — Progress Notes (Signed)
Patient is seen by me via tele-psych and I have consulted with Dr. Lucianne Muss.  Patient denies any suicidal homicidal ideations.  Patient reports still having auditory hallucinations, but they are better than they were last night.  Patient reports that he came to the hospital so that he can be restarted on his Risperdal because he has not been taking it as prescribed.  He reports that Risperdal did help and that he is followed up at [place] [facility] in the past.  Patient reports that if he could get his medications then he would follow-up at [facility] with a psychiatrist for continued medications.  At this time patient does not meet inpatient criteria and is psychiatrically cleared.  I have spoken with Dr. Adela Lank and discussed the recommendations of the medications and follow-up at [facility].  Dr. Adela Lank stated that he would assist patient with his medications.

## 2018-12-14 NOTE — ED Provider Notes (Addendum)
[name] EMERGENCY DEPARTMENT Provider Note   CSN: [id number] Arrival date & time: 12/14/18  0458     History   Chief Complaint Chief Complaint  Patient presents with  . Hallucinations    HPI Cole Barrett is a 33 y.o. male.  33 yo M with a chief complaint of auditory hallucinations.  He feels that they have gotten more intense and have become more threatening.  He stated that they were telling him that it he was there and kill him.  He is unsure of exactly the method that they would try and injure him.  He denies suicidal or homicidal ideation.  Has been taking his medications as prescribed.  Denies chest pain shortness of breath abdominal pain vomiting diarrhea.  The history is provided by the patient.  Illness  Severity:  Moderate Onset quality:  Gradual Duration:  2 weeks Timing:  Constant Progression:  Worsening Chronicity:  New Associated symptoms: no abdominal pain, no chest pain, no congestion, no diarrhea, no fever, no headaches, no myalgias, no rash, no shortness of breath and no vomiting     Past Medical History:  Diagnosis Date  . Homelessness   . Hypercholesterolemia   . Mental disorder   . Paranoid behavior (HCC)   . PE (pulmonary embolism)   . Schizophrenia [facility])     Patient Active Problem List   Diagnosis Date Noted  . 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 Alcohol abuse   . Auditory hallucinations   . Alcohol abuse with alcohol-induced mood disorder (HCC) 09/08/2016  . Homelessness 09/03/2016  . Person feigning illness 09/03/2016  . 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 Cannabis use disorder, mild, abuse 08/29/2016  . Pulmonary emboli (HCC) 07/13/2016  . Adjustment disorder with emotional disturbance 03/12/2014  . 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 Acute left flank pain 01/02/2014  . Pulmonary embolism, bilateral (HCC) 01/02/2014     History reviewed. No pertinent surgical history.      Home Medications    Prior to Admission medications   Medication Sig Start Date End Date Taking? Authorizing Provider  benztropine (COGENTIN) 0.5 MG tablet Take 1 tablet (0.5 mg total) by mouth 2 (two) times daily. Patient not taking: Reported on 12/14/2018 12/06/18   Arthor Captain, PA-C  risperiDONE (RISPERDAL) 1 MG tablet Take 1 tablet (1 mg total) by mouth 2 (two) times daily for 10 days. 12/14/18 12/24/18  Melene Plan, DO    Family History Family History  Problem Relation Age of Onset  . Hypertension Mother     Social History Social History   Tobacco Use  . Smoking status: Current Every Day Smoker    Packs/day: 0.50    Types: Cigarettes  . Smokeless tobacco: Never Used  Substance Use Topics  . Alcohol use: Yes  . Drug use: Yes    Types: Marijuana    Comment: Last used 12/06/2018     Allergies   Haldol [haloperidol]   Review of Systems Review of Systems  Constitutional: Negative for chills and fever.  HENT: Negative for congestion and facial swelling.   Eyes: Negative for discharge and visual disturbance.  Respiratory: Negative for shortness of breath.   Cardiovascular: Negative for chest pain and palpitations.  Gastrointestinal: Negative for abdominal pain, diarrhea and vomiting.  Musculoskeletal: Negative for arthralgias and myalgias.  Skin: Negative for color change and rash.  Neurological: Negative for tremors, syncope and headaches.  Psychiatric/Behavioral: Positive for hallucinations. Negative  for confusion, dysphoric mood and suicidal ideas.     Physical Exam Updated Vital Signs BP 103/66 (BP Location: Right Arm)   Pulse 88   Temp (!) 97.4 F (36.3 C) (Oral)   Resp 18   SpO2 99%   Physical Exam Vitals signs and nursing note reviewed.  Constitutional:      Appearance: He is well-developed.  HENT:     Head: Normocephalic and atraumatic.  Eyes:     Pupils: Pupils are equal, round, and  reactive to light.  Neck:     Musculoskeletal: Normal range of motion and neck supple.     Vascular: No JVD.  Cardiovascular:     Rate and Rhythm: Normal rate and regular rhythm.     Heart sounds: No murmur. No friction rub. No gallop.   Pulmonary:     Effort: No respiratory distress.     Breath sounds: No wheezing.  Abdominal:     General: There is no distension.     Tenderness: There is no guarding or rebound.  Musculoskeletal: Normal range of motion.  Skin:    Coloration: Skin is not pale.     Findings: No rash.  Neurological:     Mental Status: He is alert and oriented to person, place, and time.  Psychiatric:        Behavior: Behavior normal.      ED Treatments / Results  Labs (all labs ordered are listed, but only abnormal results are displayed) Labs Reviewed  COMPREHENSIVE METABOLIC PANEL - Abnormal; Notable for the following components:      Result Value   Calcium 8.7 (*)    All other components within normal limits  ETHANOL - Abnormal; Notable for the following components:   Alcohol, Ethyl (B) 123 (*)    All other components within normal limits  ACETAMINOPHEN LEVEL - Abnormal; Notable for the following components:   Acetaminophen (Tylenol), Serum <10 (*)    All other components within normal limits  CBC - Abnormal; Notable for the following components:   Hemoglobin 12.7 (*)    All other components within normal limits  SALICYLATE LEVEL  RAPID URINE DRUG SCREEN, HOSP PERFORMED    EKG None  Radiology No results found.  Procedures Procedures (including critical care time)  Medications Ordered in ED Medications - No data to display   Initial Impression / Assessment and Plan / ED Course  I have reviewed the triage vital signs and the nursing notes.  Pertinent labs & imaging results that were available during my care of the patient were reviewed by me and considered in my medical decision making (see chart for details).     33 yo M here with a  chief complaint of auditory hallucinations.  He is well-appearing and nontoxic.  Labs were reviewed and the patient was intoxicated on arrival, he is clinically sober currently.  Feel that he is medically clear for TTS evaluation.  TTS is evaluate the patient and feel that he is not a danger to himself or others and they are requesting discharge.  Patient is requesting a prescription for his Risperdal.  We will give him a short prescription for this.  Psych follow-up.  The patients results and plan were reviewed and discussed.   Any x-rays performed were independently reviewed by myself.   Differential diagnosis were considered with the presenting HPI.  Medications - No data to display  Vitals:   12/14/18 0503 12/14/18 0747  BP: 125/75 103/66  Pulse: 84 88  Resp: 14 18  Temp: 98 F (36.7 C) (!) 97.4 F (36.3 C)  TempSrc: Oral Oral  SpO2: 99% 99%    Final diagnoses:  Hallucinations      Final Clinical Impressions(s) / ED Diagnoses   Final diagnoses:  Hallucinations    ED Discharge Orders         Ordered    risperiDONE (RISPERDAL) 1 MG tablet  2 times daily     12/14/18 1053           Melene Plan, DO 12/14/18 1054    Melene Plan, DO 12/14/18 1055

## 2018-12-14 NOTE — ED Notes (Signed)
Pt changed into burgundy scrubs and checked by security.

## 2018-12-14 NOTE — ED Notes (Signed)
Regular Diet was ordered for Lunch. 

## 2018-12-14 NOTE — ED Notes (Signed)
TTS at bedside via video conference. 

## 2018-12-14 NOTE — ED Triage Notes (Signed)
Pt reports "the voices just got real bad... say they are going to kill me or something."

## 2018-12-14 NOTE — BH Assessment (Signed)
Tele Assessment Note   Patient Name: Cole Barrett MRN: [id number] Referring Physician: Melene Planan Floyd Location of Patient:   [facility] Location of Provider: Behavioral Health TTS Department  JacquJacquelin Hawkingelin HawkingChristopher Adam Ertel is an 33 y.o. male who [facility] seeking hospitalizations for his auditory hallucinations.  Patient was seen in the ED at [facility] last week for the same.  Patient states that his voices are getting louder and he states that they are telling him that they are going to kill him.  Patient states that, "I need some help with this situation with whatever is going on."  Patient states that he has been keeping his appointments at [facility] and states that he has been taking his medications as prescribed.  Patient denies current or any history of SI/HI.  Patient [facility] within the past month.  Patient admits that he has continued to use marijuana once every other month with his last use being 2 weeks ago.  Patient states that he is sleeping eight hours per night and states that his appetite has been good.  He denies any history of abuse and states that he has not been a self-mutilator.  Patient presented as alert and oriented, his thoughts organized and his memory intact.  He does not appear to be depressed and his affect was appropriate.  Patient was a little disheveled.  He was mildly anxious and moving around the room His speech was clear and eye contact was good.  His judgment, insight and impulse control were partially impaired.  He was cooperative with the assessment process.   Diagnosis: F20.9 Schizophrenia  Past Medical History:  Past Medical History:  Diagnosis Date  . Homelessness   . Hypercholesterolemia   . Mental disorder   . Paranoid behavior (HCC)   . PE (pulmonary embolism)   . Schizophrenia (HCC)     History reviewed. No pertinent surgical history.  Family History:  Family History  Problem Relation Age of Onset  . Hypertension Mother      Social History:  reports that he has been smoking cigarettes. He has been smoking about 0.50 packs per day. He has never used smokeless tobacco. He reports current alcohol use. He reports current drug use. Drug: Marijuana.  Additional Social History:  Alcohol / Drug Use Pain Medications: SEE MAR.  Prescriptions: Pt reports that he is not aware of the names of his MH medications.  Over the Counter: SEE MAR. Longest period of sobriety (when/how long): Pt reports sporadic use.  Substance #1 Name of Substance 1: Marijuana  1 - Age of First Use: 16 1 - Amount (size/oz): 1 blunt 1 - Frequency: 1x every 2 months 1 - Duration: since onset 1 - Last Use / Amount: 2 weeks ago  CIWA: CIWA-Ar BP: 103/66 Pulse Rate: 88 COWS:    Allergies:  Allergies  Allergen Reactions  . Haldol [Haloperidol] Shortness Of Breath    Home Medications: (Not in a hospital admission)   OB/GYN Status:  No LMP for male patient.  General Assessment Data [facility] Assessment[facility] TTS Assessment: In system Is this a Tele or Face-to-Face Assessment?: Face-to-Face Is this an Initial Assessment or a Re-assessment for this encounter?: Initial Assessment Patient Accompanied by:: N/A Language Other than English: No Living Arrangements: Other (Comment)(states that he has his own apartment) What gender do you identify as?: Male Marital status: Single Living Arrangements: Alone Can pt return to current living arrangement?: Yes Admission Status: Voluntary Is patient capable of signing  voluntary admission?: Yes Referral Source: Self/Family/Friend Insurance type: (self-pay)     Crisis Care Plan Living Arrangements: Alone Legal Guardian: Other: Name of Psychiatrist: Museum/gallery curator([facility]) Name of Therapist: none  Education Status Is patient currently in school?: No Is the patient employed, unemployed or receiving disability?: Employed  Risk to self with the past 6 months Suicidal Ideation: No Has patient been a  risk to self within the past 6 months prior to admission? : No Suicidal Intent: No Has patient had any suicidal intent within the past 6 months prior to admission? : No Is patient at risk for suicide?: No Suicidal Plan?: No Has patient had any suicidal plan within the past 6 months prior to admission? : No Access to Means: No What has been your use of drugs/alcohol within the last 12 months?: THC 2 weeks aho Previous Attempts/Gestures: No How many times?: (0) Other Self Harm Risks: none Triggers for Past Attempts: None known Intentional Self Injurious Behavior: None Family Suicide History: No Persecutory voices/beliefs?: Yes Depression: No Depression Symptoms: (none) Substance abuse history and/or treatment for substance abuse?: Yes Suicide prevention information given to non-admitted patients: Not applicable  Risk to Others within the past 6 months Homicidal Ideation: No Does patient have any lifetime risk of violence toward others beyond the six months prior to admission? : No Thoughts of Harm to Others: No Current Homicidal Intent: No Current Homicidal Plan: No Access to Homicidal Means: No Identified Victim: denied History of harm to others?: No Assessment of Violence: None Noted Violent Behavior Description: denied Does patient have access to weapons?: No Criminal Charges Pending?: Yes Describe Pending Criminal Charges: Trespassing Does patient have a court date: Yes Court Date: 12/10/18 Is patient on probation?: No  Psychosis Hallucinations: Auditory Delusions: None noted  Mental Status Report Appearance/Hygiene: Disheveled Eye Contact: Good Motor Activity: Freedom of movement Speech: Logical/coherent Level of Consciousness: Alert Mood: Ambivalent Affect: Appropriate to circumstance Anxiety Level: None Thought Processes: Coherent Judgement: Partial Orientation: Person, Place, Time, Situation Obsessive Compulsive Thoughts/Behaviors: None  Cognitive  Functioning Concentration: Normal Memory: Recent Intact, Remote Intact Is patient IDD: No Insight: Fair Impulse Control: Fair Appetite: Good Have you had any weight changes? : No Change Sleep: No Change Total Hours of Sleep: 8 Vegetative Symptoms: None  ADLScreening [facility]) Patient's cognitive ability adequate to safely complete daily activities?: Yes Patient able to express need for assistance with ADLs?: Yes Independently performs ADLs?: Yes (appropriate for developmental age)  Prior Inpatient Therapy Prior Inpatient Therapy: Yes Prior Therapy Dates: 10/2018, 2018 [facility] Reason for Treatment: (Psychosis)  Prior Outpatient Therapy Prior Outpatient Therapy: Yes Prior Therapy Dates: current Prior Therapy Facilty/Provider(s): [facility] Reason for Treatment: (medication management) Does patient have an ACCT team?: No Does patient have Intensive In-House Services?  : No Does patient have [facility] services? : Yes Does patient have P4CC services?: No  ADL Screening (condition at time of admission) Patient's cognitive ability adequate to safely complete daily activities?: Yes Is the patient deaf or have difficulty hearing?: No Does the patient have difficulty seeing, even when wearing glasses/contacts?: No Does the patient have difficulty concentrating, remembering, or making decisions?: No Patient able to express need for assistance with ADLs?: Yes Does the patient have difficulty dressing or bathing?: No Independently performs ADLs?: Yes (appropriate for developmental age) Does the patient have difficulty walking or climbing stairs?: No Weakness of Legs: None Weakness of Arms/Hands: None  Home Assistive Devices/Equipment Home Assistive Devices/Equipment: None  Therapy Consults (therapy consults  require a physician order) PT Evaluation Needed: No OT Evalulation Needed: No SLP Evaluation Needed: No Abuse/Neglect  Assessment (Assessment to be complete while patient is alone) Abuse/Neglect Assessment Can Be Completed: Yes Physical Abuse: Denies Verbal Abuse: Denies Sexual Abuse: Denies Exploitation of patient/patient's resources: Denies Self-Neglect: Denies Values / Beliefs Cultural Requests During Hospitalization: None Spiritual Requests During Hospitalization: None Consults Spiritual Care Consult Needed: No Social Work Consult Needed: No Merchant navy officer (For Healthcare) Does Patient Have a Medical Advance Directive?: No Would patient like information on creating a medical advance directive?: No - Patient declined Nutrition Screen- [facility] Adult/[facility]/AP Has the patient recently lost weight without trying?: No Has the patient been eating poorly because of a decreased appetite?: No Malnutrition Screening Tool Score: 0        Disposition: Per Reola Calkins, NP, patient is psych cleared for DC and can follow-up with [facility] Disposition Initial Assessment Completed for this Encounter: Yes  This service was provided via telemedicine using a 2-way, interactive audio and video technology.  Names of all persons participating in this telemedicine service and their role in this encounter. Name: Micharl Helmes Role: patient  Name: Dannielle Huh Trapper Meech Role: TTS  Name:  Role:   Name:  Role:     Daphene Calamity 12/14/2018 9:25 AM

## 2018-12-14 NOTE — ED Notes (Signed)
Pt provided with bus pass. Pt is ambulatory to restroom with steady gait.  Instructed to get dressed.

## 2018-12-14 NOTE — ED Notes (Signed)
Pt's belongings stored in locker #6 not inventoried from triage

## 2018-12-20 DIAGNOSIS — F17210 Nicotine dependence, cigarettes, uncomplicated: Secondary | ICD-10-CM | POA: Insufficient documentation

## 2018-12-20 DIAGNOSIS — F209 Schizophrenia, unspecified: Secondary | ICD-10-CM | POA: Insufficient documentation

## 2018-12-20 DIAGNOSIS — Z590 Homelessness: Secondary | ICD-10-CM | POA: Insufficient documentation

## 2018-12-20 DIAGNOSIS — F329 Major depressive disorder, single episode, unspecified: Secondary | ICD-10-CM | POA: Insufficient documentation

## 2018-12-21 ENCOUNTER — Encounter (HOSPITAL_COMMUNITY): Payer: Self-pay | Admitting: *Deleted

## 2018-12-21 ENCOUNTER — Other Ambulatory Visit: Payer: Self-pay

## 2018-12-21 ENCOUNTER — Emergency Department (HOSPITAL_COMMUNITY)
Admission: EM | Admit: 2018-12-21 | Discharge: 2018-12-21 | Disposition: A | Discharge status: Home / Self Care | Payer: Self-pay | Attending: Emergency Medicine | Admitting: Emergency Medicine

## 2018-12-21 DIAGNOSIS — R443 Hallucinations, unspecified: Secondary | ICD-10-CM

## 2018-12-21 LAB — ACETAMINOPHEN LEVEL: Acetaminophen (Tylenol), Serum: <10 ug/mL — ABNORMAL LOW (ref 10–30)

## 2018-12-21 LAB — CBC
HCT: 38.4 % — ABNORMAL LOW (ref 39.0–52.0)
Hemoglobin: 11.9 g/dL — ABNORMAL LOW (ref 13.0–17.0)
MCH: 26.8 pg (ref 26.0–34.0)
MCHC: 31 g/dL (ref 30.0–36.0)
MCV: 86.5 fL (ref 80.0–100.0)
Platelets: 276 10*3/uL (ref 150–400)
RBC: 4.44 MIL/uL (ref 4.22–5.81)
RDW: 13 % (ref 11.5–15.5)
WBC: 3.4 10*3/uL — ABNORMAL LOW (ref 4.0–10.5)
nRBC: 0 % (ref 0.0–0.2)

## 2018-12-21 LAB — COMPREHENSIVE METABOLIC PANEL
ALT: 35 U/L (ref 0–44)
AST: 25 U/L (ref 15–41)
Albumin: 3.7 g/dL (ref 3.5–5.0)
Alkaline Phosphatase: 37 U/L — ABNORMAL LOW (ref 38–126)
Anion gap: 8 (ref 5–15)
BUN: 13 mg/dL (ref 6–20)
CO2: 24 mmol/L (ref 22–32)
Calcium: 8.6 mg/dL — ABNORMAL LOW (ref 8.9–10.3)
Chloride: 108 mmol/L (ref 98–111)
Creatinine, Ser: 0.97 mg/dL (ref 0.61–1.24)
GFR calc Af Amer: >60 mL/min (ref >60)
GFR calc non Af Amer: >60 mL/min (ref >60)
Glucose, Bld: 115 mg/dL — ABNORMAL HIGH (ref 70–99)
Potassium: 3.6 mmol/L (ref 3.5–5.1)
Sodium: 140 mmol/L (ref 135–145)
Total Bilirubin: 0.5 mg/dL (ref 0.3–1.2)
Total Protein: 7 g/dL (ref 6.5–8.1)

## 2018-12-21 LAB — RAPID URINE DRUG SCREEN, HOSP PERFORMED
AMPHETAMINES: NOT DETECTED
BENZODIAZEPINES: NOT DETECTED
Barbiturates: NOT DETECTED
Cocaine: NOT DETECTED
Opiates: NOT DETECTED
Tetrahydrocannabinol: NOT DETECTED

## 2018-12-21 LAB — SALICYLATE LEVEL: Salicylate Lvl: <7 mg/dL (ref 2.8–30.0)

## 2018-12-21 LAB — ETHANOL

## 2018-12-21 MED ORDER — ZIPRASIDONE MESYLATE 20 MG IM SOLR: 20.0000 mg | INTRAMUSCULAR | Status: DC | PRN

## 2018-12-21 MED ORDER — RISPERIDONE 2 MG PO TBDP
2.0000 mg | ORAL_TABLET | Freq: Three times a day (TID) | ORAL | Status: DC | PRN
  Filled 2018-12-21: qty 1

## 2018-12-21 MED ORDER — LORAZEPAM 1 MG PO TABS: 1.0000 mg | ORAL_TABLET | ORAL | Status: DC | PRN

## 2018-12-21 MED ORDER — ACETAMINOPHEN 325 MG PO TABS: 650.0000 mg | ORAL_TABLET | ORAL | Status: DC | PRN

## 2018-12-21 MED ORDER — ZOLPIDEM TARTRATE 5 MG PO TABS: 5.0000 mg | ORAL_TABLET | Freq: Every evening | ORAL | Status: DC | PRN

## 2018-12-21 NOTE — ED Provider Notes (Signed)
[name] EMERGENCY [facility]PARTMENT Provider Note   CSN: [id number] Arrival date & time: 12/20/18  2346     History   Chief Complaint Chief Complaint  Patient presents with  . Hallucinations    HPI Cole Barrett is a 33 y.o. male.  HPI  33 year old male with history of paranoia comes into the ER with chief complaint of hallucinations. Patient was seen in the ER with similar Symptoms few days ago.  He was started on Risperdal and advised to follow-up with [facility].  Patient states that despite him taking Risperdal, he continues to have voices and occasionally they get worse.  Yesterday his voices were severe therefore he decided to come to the ER.  He does not have any SI, HI.  Past Medical History:  Diagnosis Date  . Homelessness   . Hypercholesterolemia   . Mental disorder   . Paranoid behavior (HCC)   . PE (pulmonary embolism)   . Schizophrenia [facility](HCC)     Patient Active Problem List   Diagnosis Date Noted  . 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 Alcohol abuse   . Auditory hallucinations   . Alcohol abuse with alcohol-induced mood disorder (HCC) 09/08/2016  . Homelessness 09/03/2016  . Person feigning illness 09/03/2016  . 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 Cannabis use disorder, mild, abuse 08/29/2016  . Pulmonary emboli (HCC) 07/13/2016  . Adjustment disorder with emotional disturbance 03/12/2014  . 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 Acute left flank pain 01/02/2014  . Pulmonary embolism, bilateral (HCC) 01/02/2014    History reviewed. No pertinent surgical history.      Home Medications    Prior to Admission medications   Medication Sig Start Date End Date Taking? Authorizing Provider  risperiDONE (RISPERDAL) 1 MG tablet Take 1 tablet (1 mg total) by mouth 2 (two) times daily for 10 days. 12/14/18 12/24/18 Yes Melene PlanFloyd, Dan, DO  benztropine (COGENTIN) 0.5  MG tablet Take 1 tablet (0.5 mg total) by mouth 2 (two) times daily. Patient not taking: Reported on 12/14/2018 12/06/18   Arthor CaptainHarris, Abigail, PA-C    Family History Family History  Problem Relation Age of Onset  . Hypertension Mother     Social History Social History   Tobacco Use  . Smoking status: Current Every Day Smoker    Packs/day: 0.50    Types: Cigarettes  . Smokeless tobacco: Never Used  Substance Use Topics  . Alcohol use: Yes  . Drug use: Yes    Types: Marijuana    Comment: Last used 12/06/2018     Allergies   Haldol [haloperidol]   Review of Systems Review of Systems  Constitutional: Positive for activity change.  Psychiatric/Behavioral: Positive for hallucinations.     Physical Exam Updated Vital Signs BP (!) 118/58   Pulse 71   Temp 98.5 F (36.9 C) (Oral)   Resp 17   SpO2 97%   Physical Exam Vitals signs and nursing note reviewed.  Constitutional:      Appearance: He is well-developed.  HENT:     Head: Atraumatic.  Neck:     Musculoskeletal: Neck supple.  Cardiovascular:     Rate and Rhythm: Normal rate.  Pulmonary:     Effort: Pulmonary effort is normal.  Skin:    General: Skin is warm.  Neurological:     Mental Status: He is alert and oriented to person, place, and time.      ED Treatments / Results  Labs (all labs ordered are listed, but only abnormal results are displayed) Labs Reviewed  COMPREHENSIVE METABOLIC PANEL - Abnormal; Notable for the following components:      Result Value   Glucose, Bld 115 (*)    Calcium 8.6 (*)    Alkaline Phosphatase 37 (*)    All other components within normal limits  ACETAMINOPHEN LEVEL - Abnormal; Notable for the following components:   Acetaminophen (Tylenol), Serum <10 (*)    All other components within normal limits  CBC - Abnormal; Notable for the following components:   WBC 3.4 (*)    Hemoglobin 11.9 (*)    HCT 38.4 (*)    All other components within normal limits  ETHANOL    SALICYLATE LEVEL  RAPID URINE DRUG SCREEN, HOSP PERFORMED    EKG None  Radiology No results found.  Procedures Procedures (including critical care time)  Medications Ordered in ED Medications  risperiDONE (RISPERDAL M-TABS) disintegrating tablet 2 mg (has no administration in time range)    And  LORazepam (ATIVAN) tablet 1 mg (has no administration in time range)    And  ziprasidone (GEODON) injection 20 mg (has no administration in time range)  acetaminophen (TYLENOL) tablet 650 mg (has no administration in time range)  zolpidem (AMBIEN) tablet 5 mg (has no administration in time range)     Initial Impression / Assessment and Plan / ED Course  I have reviewed the triage vital signs and the nursing notes.  Pertinent labs & imaging results that were available during my care of the patient were reviewed by me and considered in my medical decision making (see chart for details).     Patient comes in a chief complaint of hallucinations. He appears to have diagnosis of psychosis.  He was started on Risperdal recently, states that his symptoms have persisted despite taking the medication.  He occasionally has worsening of the voices, and yesterday was 1 of those days therefore he came to the ER.  He has no SI, HI.  Psych called and they want patient to continue with outpatient follow-up.  Final Clinical Impressions(s) / ED Diagnoses   Final diagnoses:  Hallucinations    ED Discharge Orders    None       Derwood KaplanNanavati, Reine Bristow, MD 12/21/18 [phone]

## 2018-12-21 NOTE — Discharge Instructions (Signed)
We spoke with our psychiatry team.  They would like you to [facility] with [facility] -at the address provided. They have a walk-in clinic, therefore you can actually go there straight from the emergency room for further help.

## 2018-12-21 NOTE — ED Triage Notes (Signed)
Pt is hearing voices telling him that they are going to kill me. No denies SI/HI

## 2018-12-21 NOTE — BH Assessment (Signed)
Tele Assessment Note   Patient Name: Cole Barrett MRN: [id number] Referring Physician: Dr. [name] Location of Patient: [facility]  Location of Provider: [facility]  Gus Dicks is an 33 y.o., single male. Pt presented to [facility] voluntarily and unaccompanied due to experiencing ongoing AH that are telling him that they are going to kill him. Pt shares that he has been experiencing these AH for over two years and that he hears 1-2 voices telling him this. Pt shares that he currently works at [name] in [place], that he is single and lives independently in a home, and that he attends [facility] for psychiatric services. Pt denies SI, HI, VH, and SA. Pt reports sporadic use of marijuana, but stated that he has not used recently.  He denies any SI attempts, a plan, intent, and states that the last time he was hospitalized was at [facility] in 2018. He shares his appetite is good and that he has been sleeping approximately 8 hours per night.  Pt denies current SA and any involvement in the legal system. He denies any acces to weapons. Pt declined any family history of SI, MH, or SA or any abuse inflicted onto him in the past or currently.  He shares he is able to independently complete his ADLs.  Pt oriented to person, place and situation. Pt presented alert, dressed appropriately and groomed. Pt spoke clearly, coherently and did not seem to be under the influence of any substances. Pt made good eye contact and answered questions appropriately. Pt presented cooperative and calm and open to the assessment process. Pt presented with no impairments of remote or recent memory that could be detected due to having no collateral.   Diagnosis: Per Hx: F20.9,Schizophrenia  Past Medical History:  Past Medical History:  Diagnosis Date  . Homelessness   . Hypercholesterolemia   . Mental disorder   . Paranoid behavior (HCC)   . PE (pulmonary embolism)   .  Schizophrenia (HCC)     History reviewed. No pertinent surgical history.  Family History:  Family History  Problem Relation Age of Onset  . Hypertension Mother     Social History:  reports that he has been smoking cigarettes. He has been smoking about 0.50 packs per day. He has never used smokeless tobacco. He reports current alcohol use. He reports current drug use. Drug: Marijuana.  Additional Social History:  Alcohol / Drug Use Pain Medications: SEE MAR.  Prescriptions: Pt reports being prescribed Risperdol.  Over the Counter: SEE MAR.  History of alcohol / drug use?: Yes Longest period of sobriety (when/how long): PT reports no current use.  Substance #1 Name of Substance 1: MARIJUANA  1 - Age of First Use: TEENS  1 - Amount (size/oz): "A little bit."  1 - Frequency: Sporadic Use  1 - Duration: Unsure based on answers.  1 - Last Use / Amount: Pt reports that it has been a few months.   CIWA: CIWA-Ar BP: (!) 118/59 Pulse Rate: 70 COWS:    Allergies:  Allergies  Allergen Reactions  . Haldol [Haloperidol] Shortness Of Breath    Home Medications: (Not in a hospital admission)   OB/GYN Status:  No LMP for male patient.  General Assessment Data Location of Assessment: [facility] ED TTS Assessment: In system Is this a Tele or Face-to-Face Assessment?: Tele Assessment Is this an Initial Assessment or a Re-assessment for this encounter?: Initial Assessment Patient Accompanied by:: N/A Language Other than English: No Living Arrangements:  Other (Comment) What gender do you identify as?: Male Marital status: Single Maiden name: N/A Pregnancy Status: No Living Arrangements: Alone Can pt return to current living arrangement?: Yes Admission Status: Voluntary Is patient capable of signing voluntary admission?: Yes Referral Source: Self/Family/Friend Insurance type: Self Pay   Medical Screening Exam [facility] Walk-in ONLY) Medical Exam completed: Yes  Crisis Care Plan Living  Arrangements: Alone Legal Guardian: Other:(Self) Name of Psychiatrist: [facility]  Name of Therapist: Denied  Education Status Is patient currently in school?: No Is the patient employed, unemployed or receiving disability?: Employed  Risk to self with the past 6 months Suicidal Ideation: No Has patient been a risk to self within the past 6 months prior to admission? : No Suicidal Intent: No Has patient had any suicidal intent within the past 6 months prior to admission? : No Is patient at risk for suicide?: No Suicidal Plan?: No Has patient had any suicidal plan within the past 6 months prior to admission? : No Access to Means: No What has been your use of drugs/alcohol within the last 12 months?: PT reports THC use previously.  Previous Attempts/Gestures: No How many times?: 0 Other Self Harm Risks: Denied Triggers for Past Attempts: None known Intentional Self Injurious Behavior: None Family Suicide History: No Recent stressful life event(s): Other (Comment)(Pt denied. ) Persecutory voices/beliefs?: No Depression: No Substance abuse history and/or treatment for substance abuse?: No Suicide prevention information given to non-admitted patients: Not applicable  Risk to Others within the past 6 months Homicidal Ideation: No Does patient have any lifetime risk of violence toward others beyond the six months prior to admission? : No Thoughts of Harm to Others: No Current Homicidal Intent: No Current Homicidal Plan: No Access to Homicidal Means: No Identified Victim: Denied History of harm to others?: No Assessment of Violence: None Noted Violent Behavior Description: Denied Does patient have access to weapons?: No Criminal Charges Pending?: Yes Describe Pending Criminal Charges: Trespassing Does patient have a court date: Yes Court Date: 12/10/18 Is patient on probation?: No  Psychosis Hallucinations: Auditory Delusions: None noted  Mental Status  Report Appearance/Hygiene: Poor hygiene, In scrubs Eye Contact: Good Motor Activity: Freedom of movement Speech: Logical/coherent Level of Consciousness: Alert Mood: Pleasant, Euthymic Affect: Appropriate to circumstance Anxiety Level: None Thought Processes: Coherent, Relevant Judgement: Partial Orientation: Person, Place, Time, Situation, Appropriate for developmental age Obsessive Compulsive Thoughts/Behaviors: Unable to Assess  Cognitive Functioning Concentration: Normal Memory: Unable to Assess Is patient IDD: No Insight: Poor Impulse Control: Fair Appetite: Good Have you had any weight changes? : No Change Sleep: No Change Total Hours of Sleep: 8 Vegetative Symptoms: None  ADLScreening [facility] Assessment Services) Patient's cognitive ability adequate to safely complete daily activities?: Yes Patient able to express need for assistance with ADLs?: Yes Independently performs ADLs?: Yes (appropriate for developmental age)  Prior Inpatient Therapy Prior Inpatient Therapy: Yes Prior Therapy Dates: 10/2018, 2018 [facility] Reason for Treatment: Psychosis.   Prior Outpatient Therapy Prior Outpatient Therapy: Yes Prior Therapy Dates: current Prior Therapy Facilty/Provider(s): [facility] Reason for Treatment: Medication management. Does patient have an ACCT team?: No Does patient have Intensive In-House Services?  : No Does patient have [facility] services? : Yes Does patient have P4CC services?: No  ADL Screening (condition at time of admission) Patient's cognitive ability adequate to safely complete daily activities?: Yes Is the patient deaf or have difficulty hearing?: No Does the patient have difficulty seeing, even when wearing glasses/contacts?: No Does the patient  have difficulty concentrating, remembering, or making decisions?: No Patient able to express need for assistance with ADLs?: Yes Does the patient have difficulty  dressing or bathing?: No Independently performs ADLs?: Yes (appropriate for developmental age) Does the patient have difficulty walking or climbing stairs?: No Weakness of Legs: None Weakness of Arms/Hands: None  Home Assistive Devices/Equipment Home Assistive Devices/Equipment: None  Therapy Consults (therapy consults require a physician order) PT Evaluation Needed: No OT Evalulation Needed: No SLP Evaluation Needed: No Abuse/Neglect Assessment (Assessment to be complete while patient is alone) Abuse/Neglect Assessment Can Be Completed: Yes Physical Abuse: Denies Verbal Abuse: Denies Sexual Abuse: Denies Exploitation of patient/patient's resources: Denies Self-Neglect: Denies Values / Beliefs Cultural Requests During Hospitalization: None Spiritual Requests During Hospitalization: None Consults Spiritual Care Consult Needed: No Social Work Consult Needed: No Merchant navy officer (For Healthcare) Does Patient Have a Medical Advance Directive?: No Would patient like information on creating a medical advance directive?: No - Patient declined          Disposition: Per Donell Sievert, PA; Pt does not meet criteria for inpatient treatment. Pt recommended to follow up with [facility] for OPT.  Disposition Initial Assessment Completed for this Encounter: Yes Patient referred to: Outpatient clinic referral, Other (Comment)(Pt to follow up at [facility] per Robert Bellow, PA)  This service was provided via telemedicine using a 2-way, interactive audio and video technology.  Names of all persons participating in this telemedicine service and their role in this encounter. Name: Loretha Brasil Role: Patient   Name: Chesley Noon  Role: Clinician   Name:  Role:   Name:  Role:    Chesley Noon, M.S., [facility], LCAS Triage Specialist [facility] 12/21/2018 6:58 AM

## 2018-12-21 NOTE — ED Notes (Signed)
Pt discharged from ED; instructions provided; Pt encouraged to return to ED if symptoms worsen and to f/u with PCP; Pt verbalized understanding of all instructions 

## 2018-12-21 NOTE — Progress Notes (Signed)
Nurse, Tray and Dr. [name] informed of pt disposition.

## 2018-12-26 DIAGNOSIS — F329 Major depressive disorder, single episode, unspecified: Secondary | ICD-10-CM | POA: Insufficient documentation

## 2018-12-26 DIAGNOSIS — F209 Schizophrenia, unspecified: Secondary | ICD-10-CM | POA: Insufficient documentation

## 2018-12-26 DIAGNOSIS — Z590 Homelessness: Secondary | ICD-10-CM | POA: Insufficient documentation

## 2018-12-26 DIAGNOSIS — F17210 Nicotine dependence, cigarettes, uncomplicated: Secondary | ICD-10-CM | POA: Insufficient documentation

## 2018-12-26 DIAGNOSIS — F1210 Cannabis abuse, uncomplicated: Secondary | ICD-10-CM | POA: Insufficient documentation

## 2018-12-26 DIAGNOSIS — F1010 Alcohol abuse, uncomplicated: Secondary | ICD-10-CM | POA: Insufficient documentation

## 2018-12-27 ENCOUNTER — Encounter (HOSPITAL_COMMUNITY): Payer: Self-pay | Admitting: Emergency Medicine

## 2018-12-27 ENCOUNTER — Emergency Department (HOSPITAL_COMMUNITY)
Admission: EM | Admit: 2018-12-27 | Discharge: 2018-12-27 | Disposition: A | Discharge status: Home / Self Care | Payer: Self-pay | Attending: Emergency Medicine | Admitting: Emergency Medicine

## 2018-12-27 ENCOUNTER — Other Ambulatory Visit: Payer: Self-pay

## 2018-12-27 DIAGNOSIS — R440 Auditory hallucinations: Secondary | ICD-10-CM

## 2018-12-27 LAB — CBC
HCT: 38.1 % — ABNORMAL LOW (ref 39.0–52.0)
Hemoglobin: 12 g/dL — ABNORMAL LOW (ref 13.0–17.0)
MCH: 27.8 pg (ref 26.0–34.0)
MCHC: 31.5 g/dL (ref 30.0–36.0)
MCV: 88.4 fL (ref 80.0–100.0)
NRBC: 0 % (ref 0.0–0.2)
Platelets: 271 10*3/uL (ref 150–400)
RBC: 4.31 MIL/uL (ref 4.22–5.81)
RDW: 12.7 % (ref 11.5–15.5)
WBC: 3.4 10*3/uL — AB (ref 4.0–10.5)

## 2018-12-27 LAB — COMPREHENSIVE METABOLIC PANEL
ALT: 40 U/L (ref 0–44)
AST: 27 U/L (ref 15–41)
Albumin: 3.9 g/dL (ref 3.5–5.0)
Alkaline Phosphatase: 40 U/L (ref 38–126)
Anion gap: 11 (ref 5–15)
BUN: 11 mg/dL (ref 6–20)
CO2: 22 mmol/L (ref 22–32)
Calcium: 8.2 mg/dL — ABNORMAL LOW (ref 8.9–10.3)
Chloride: 105 mmol/L (ref 98–111)
Creatinine, Ser: 0.95 mg/dL (ref 0.61–1.24)
GFR calc non Af Amer: >60 mL/min (ref >60)
Glucose, Bld: 129 mg/dL — ABNORMAL HIGH (ref 70–99)
Potassium: 3.6 mmol/L (ref 3.5–5.1)
Sodium: 138 mmol/L (ref 135–145)
Total Bilirubin: 0.5 mg/dL (ref 0.3–1.2)
Total Protein: 6.8 g/dL (ref 6.5–8.1)

## 2018-12-27 LAB — RAPID URINE DRUG SCREEN, HOSP PERFORMED
AMPHETAMINES: NOT DETECTED
BENZODIAZEPINES: NOT DETECTED
Barbiturates: NOT DETECTED
COCAINE: NOT DETECTED
Opiates: NOT DETECTED
Tetrahydrocannabinol: NOT DETECTED

## 2018-12-27 LAB — SALICYLATE LEVEL: Salicylate Lvl: <7 mg/dL (ref 2.8–30.0)

## 2018-12-27 LAB — ETHANOL: Alcohol, Ethyl (B): 40 mg/dL — ABNORMAL HIGH (ref <10)

## 2018-12-27 LAB — ACETAMINOPHEN LEVEL: Acetaminophen (Tylenol), Serum: <10 ug/mL — ABNORMAL LOW (ref 10–30)

## 2018-12-27 MED ORDER — BENZTROPINE MESYLATE 0.5 MG PO TABS
0.5000 mg | ORAL_TABLET | Freq: Two times a day (BID) | ORAL | Status: DC
  Administered 2018-12-27: 0.5 mg via ORAL
  Filled 2018-12-27: qty 1

## 2018-12-27 MED ORDER — BENZTROPINE MESYLATE 0.5 MG PO TABS: 0.5000 mg | ORAL_TABLET | Freq: Two times a day (BID) | ORAL | 0 refills | Status: DC

## 2018-12-27 MED ORDER — RISPERIDONE 1 MG PO TABS: 1.0000 mg | ORAL_TABLET | Freq: Two times a day (BID) | ORAL | 0 refills | Status: DC

## 2018-12-27 MED ORDER — RISPERIDONE 1 MG PO TABS
1.0000 mg | ORAL_TABLET | Freq: Two times a day (BID) | ORAL | Status: DC
  Administered 2018-12-27: 1 mg via ORAL
  Filled 2018-12-27: qty 1

## 2018-12-27 NOTE — BH Assessment (Signed)
[facility] Assessment Progress Note   Clinician again attempted to assess patient.  Patient is still sleeping soundly and loudly.  Patient to be assessed by TTS when he is alert and oriented.

## 2018-12-27 NOTE — ED Notes (Signed)
Pt encouraged to take a shower. Shower supplies provided.

## 2018-12-27 NOTE — [facility] Suicide Risk Assessment (Cosign Needed)
Suicide Risk Assessment  Discharge Assessment   [facility] Discharge Suicide Risk Assessment   Principal Problem: Auditory hallucinations Discharge Diagnoses: Principal Problem:   Auditory hallucinations   Total Time spent with patient: 30 minutes  Musculoskeletal: Strength & Muscle Tone: within normal limits Gait & Station: normal Patient leans: N/A  Psychiatric Specialty Exam:   Blood pressure 128/82, pulse 95, temperature 98.4 F (36.9 C), temperature source Oral, resp. rate 16, SpO2 96 %.There is no height or weight on file to calculate BMI.  General Appearance: Casual  Eye Contact::  Good  Speech:  Clear and Coherent and Normal Rate409  Volume:  Normal  Mood:  Euthymic  Affect:  Congruent  Thought Process:  Coherent and Descriptions of Associations: Intact  Orientation:  Full (Time, Place, and Person)  Thought Content:  Logical and Hallucinations: Auditory  Suicidal Thoughts:  No  Homicidal Thoughts:  No  Memory:  Immediate;   Good Recent;   Fair Remote;   Fair  Judgement:  Poor  Insight:  Present  Psychomotor Activity:  Normal  Concentration:  Fair  Recall:  [employer] of Knowledge:Fair  Language: Good  Akathisia:  No  Handed:  Right  AIMS (if indicated):     Assets:  Architect Housing Social Support  Sleep:     Cognition: WNL  ADL's:  Intact   Mental Status Per Nursing Assessment::   On Admission:   Pt is well known to this emergency room and frequently comes to the emergency room for auditory hallucinations and asking for his medications. Pt goes to [facility] for his medication management. Pt stated the voices are lessened now and he is ready to go. He works at a [employer].   Demographic Factors:  Male, Low socioeconomic status and Unemployed  Loss Factors: Financial problems/change in socioeconomic status  Historical Factors: Family history of mental illness or substance abuse  Risk Reduction Factors:    Sense of responsibility to family and Positive social support  Continued Clinical Symptoms:  Schizophrenia:   Paranoid or undifferentiated type  Cognitive Features That Contribute To Risk:  Closed-mindedness    Suicide Risk:  Minimal: No identifiable suicidal ideation.  Patients presenting with no risk factors but with morbid ruminations; may be classified as minimal risk based on the severity of the depressive symptoms   Plan Of Care/Follow-up recommendations:  Activity:  as tolerated  Diet:  Heart Healthy  Laveda Abbe, NP 12/27/2018, 9:42 AM

## 2018-12-27 NOTE — ED Provider Notes (Signed)
[facility]-EMERGENCY DEPT Provider Note   CSN: [id number] Arrival date & time: 12/26/18  2342     History   Chief Complaint Chief Complaint  Patient presents with  . Hearing voices    HPI Omarion Aquirre is a 33 y.o. male.  33 year old male with history of schizophrenia, paranoid behavior, PE presents to the emergency department for auditory hallucinations.  He states that he has been hearing voices telling him that people are going to kill him.  He denies any suicidal or homicidal thoughts.  No visual hallucinations.  He reports full compliance with his psychiatric medications, was recently started on Risperdal.  He thinks he needs to go back to behavioral health for further treatment.  Reports drinking 1 beer earlier.  Denies illicit drug use.  He has a history of alcohol abuse as well as homelessness.  The history is provided by the patient. No language interpreter was used.    Past Medical History:  Diagnosis Date  . Homelessness   . Hypercholesterolemia   . Mental disorder   . Paranoid behavior (HCC)   . PE (pulmonary embolism)   . Schizophrenia [facility])     Patient Active Problem List   Diagnosis Date Noted  . 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 Alcohol abuse   . Auditory hallucinations   . Alcohol abuse with alcohol-induced mood disorder (HCC) 09/08/2016  . Homelessness 09/03/2016  . Person feigning illness 09/03/2016  . 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 Cannabis use disorder, mild, abuse 08/29/2016  . Pulmonary emboli (HCC) 07/13/2016  . Adjustment disorder with emotional disturbance 03/12/2014  . 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 Acute left flank pain 01/02/2014  . Pulmonary embolism, bilateral (HCC) 01/02/2014    No past surgical history on file.      Home Medications    Prior to Admission medications   Medication Sig Start Date End Date  Taking? Authorizing Provider  risperiDONE (RISPERDAL) 1 MG tablet Take 1 tablet (1 mg total) by mouth 2 (two) times daily for 10 days. 12/14/18 12/27/18 Yes Melene Plan, DO  benztropine (COGENTIN) 0.5 MG tablet Take 1 tablet (0.5 mg total) by mouth 2 (two) times daily. Patient not taking: Reported on 12/14/2018 12/06/18   Arthor Captain, PA-C    Family History Family History  Problem Relation Age of Onset  . Hypertension Mother     Social History Social History   Tobacco Use  . Smoking status: Current Every Day Smoker    Packs/day: 0.50    Types: Cigarettes  . Smokeless tobacco: Never Used  Substance Use Topics  . Alcohol use: Yes  . Drug use: Yes    Types: Marijuana    Comment: Last used 12/06/2018     Allergies   Haldol [haloperidol]   Review of Systems Review of Systems Ten systems reviewed and are negative for acute change, except as noted in the HPI.    Physical Exam Updated Vital Signs BP 128/82 (BP Location: Right Arm)   Pulse 95   Temp 98.4 F (36.9 C) (Oral)   Resp 16   SpO2 96%   Physical Exam Vitals signs and nursing note reviewed.  Constitutional:      General: He is not in acute distress.    Appearance: He is well-developed. He is not diaphoretic.     Comments: Nontoxic appearing and in NAD  HENT:     Head: Normocephalic and atraumatic.  Eyes:  General: No scleral icterus.    Conjunctiva/sclera: Conjunctivae normal.  Neck:     Musculoskeletal: Normal range of motion.  Pulmonary:     Effort: Pulmonary effort is normal. No respiratory distress.  Musculoskeletal: Normal range of motion.  Skin:    General: Skin is warm and dry.     Coloration: Skin is not pale.     Findings: No erythema or rash.  Neurological:     Mental Status: He is alert and oriented to person, place, and time.  Psychiatric:        Attention and Perception: He perceives auditory hallucinations.        Behavior: Behavior normal.        Thought Content: Thought content does  not include homicidal or suicidal ideation.     Comments: Not reacting to internal stimuli      ED Treatments / Results  Labs (all labs ordered are listed, but only abnormal results are displayed) Labs Reviewed  COMPREHENSIVE METABOLIC PANEL - Abnormal; Notable for the following components:      Result Value   Glucose, Bld 129 (*)    Calcium 8.2 (*)    All other components within normal limits  ETHANOL - Abnormal; Notable for the following components:   Alcohol, Ethyl (B) 40 (*)    All other components within normal limits  ACETAMINOPHEN LEVEL - Abnormal; Notable for the following components:   Acetaminophen (Tylenol), Serum <10 (*)    All other components within normal limits  CBC - Abnormal; Notable for the following components:   WBC 3.4 (*)    Hemoglobin 12.0 (*)    HCT 38.1 (*)    All other components within normal limits  SALICYLATE LEVEL  RAPID URINE DRUG SCREEN, HOSP PERFORMED    EKG None  Radiology No results found.  Procedures Procedures (including critical care time)  Medications Ordered in ED Medications - No data to display   Initial Impression / Assessment and Plan / ED Course  I have reviewed the triage vital signs and the nursing notes.  Pertinent labs & imaging results that were available during my care of the patient were reviewed by me and considered in my medical decision making (see chart for details).     33 year old male presenting for auditory hallucinations.  Reports compliance with his psychiatric medications.  Denies any suicidal or homicidal thoughts.  Is not reacting to internal stimuli.  Is requesting psychiatric evaluation.  Pending TTS consultation.  Disposition to be determined by oncoming ED provider.   Final Clinical Impressions(s) / ED Diagnoses   Final diagnoses:  Auditory hallucinations    ED Discharge Orders    None       Antony MaduraHumes, Danissa Rundle, PA-C 12/27/18 0615    Palumbo, April, MD 12/27/18 [id number]

## 2018-12-27 NOTE — ED Triage Notes (Signed)
Pt is hearing voices. Pt states " the voices are telling me they are going to kill me".

## 2018-12-27 NOTE — BH Assessment (Signed)
[facility] Assessment Progress Note   Clinician attempted to awaken patient but he did not awaken.  TTS to attempt assessment again in a few hours.

## 2018-12-27 NOTE — Discharge Instructions (Addendum)
For your mental health needs, you are advised continue treatment with [facility]:       [facility]      [address]      [place], [place] [address]      [phone]

## 2018-12-27 NOTE — ED Notes (Signed)
Pt prompted to take shower, shower supplies given to pt.

## 2018-12-27 NOTE — ED Notes (Signed)
Pt alert and oriented. Pt denies any pain at this time. Pt denies any si or hi. Pt c/o of auditory hallucinations. Pt resting and cooperative. Pt wand by security and changed into paper scrubs. Pt safe, will continue to monitor.

## 2018-12-27 NOTE — BH Assessment (Signed)
Assessment Note  Cole Barrett is an 33 y.o. male presenting voluntarily to [facility] complaining of AH of voices "saying they are going to kill me." Patient is well known to [facility] EDs and has had 25 visits in the past year for similar complaints. When clinician went to assess patient he stated "Can I just get a prescription for Risperdal and be discharged. I don't think I need to be discharged, I just need my medication. Patient endorses AH but denies SI/AH/AVH. Patient does not have any documented history of suicide attempts or homicidal behavior. Patient reports [facility]. Patient is currently employed at [employer] and lives alone in his home. Patient reports sporadic THC use and denies any other substance use. Patient was hospitalized at [facility] in 2018 and [facility] in 2019 for psychosis. Patient denies any current criminal charges or history of abuse.   Patient was alert and oriented x 4. He was dressed in scrubs and presented with poor hygiene/ body odor. His made good eye contact and his speech was logical and coherent. Patient's insight and impulse control are fair, however exhibits poor judgement. Patient did not appear to be responding to internal stimuli or experiencing delusional thought content at time of assessment.  Diagnosis: F20.9 Schizophrenia (per hx)  Past Medical History:  Past Medical History:  Diagnosis Date  . Homelessness   . Hypercholesterolemia   . Mental disorder   . Paranoid behavior (HCC)   . PE (pulmonary embolism)   . Schizophrenia (HCC)     History reviewed. No pertinent surgical history.  Family History:  Family History  Problem Relation Age of Onset  . Hypertension Mother     Social History:  reports that he has been smoking cigarettes. He has been smoking about 0.50 packs per day. He has never used smokeless tobacco. He reports current alcohol use. He reports current drug use. Drug: Marijuana.  Additional  Social History:  Alcohol / Drug Use Pain Medications: see MAR Prescriptions: see MAR Over the Counter: see MAR History of alcohol / drug use?: Yes Longest period of sobriety (when/how long): a couple months Substance #1 Name of Substance 1: THC 1 - Age of First Use: teens 1 - Amount (size/oz): varies 1 - Frequency: sporadic 1 - Duration: several years 1 - Last Use / Amount: 2 months ago  CIWA: CIWA-Ar BP: 128/82 Pulse Rate: 95 COWS:    Allergies:  Allergies  Allergen Reactions  . Haldol [Haloperidol] Shortness Of Breath    Home Medications: (Not in a hospital admission)   OB/GYN Status:  No LMP for male patient.  General Assessment Data Assessment unable to be completed: Yes Reason for not completing assessment: Pt too sleepy to participate in assessment. [facility]: [facility] Assessment: In system Is this a Tele or Face-to-Face Assessment?: Face-to-Face Is this an Initial Assessment or a Re-assessment for this encounter?: Initial Assessment Patient Accompanied by:: N/A Language Other than English: No Living Arrangements: Other (Comment)(home) What gender do you identify as?: Male Marital status: Single Pregnancy Status: No Living Arrangements: Alone Can pt return to current living arrangement?: Yes Admission Status: Voluntary Is patient capable of signing voluntary admission?: Yes Referral Source: Self/Family/Friend Insurance type: self pay     Crisis Care Plan Living Arrangements: Alone Legal Guardian: Other:(self) [facility]  Name of Therapist: Denied  Education Status Is patient currently in school?: No Is the patient employed, unemployed or receiving disability?: Employed  Risk  to self with the past 6 months Suicidal Ideation: No Has patient been a risk to self within the past 6 months prior to admission? : No Suicidal Intent: No Has patient had any suicidal intent within the past 6 months prior to admission? :  No Is patient at risk for suicide?: No Suicidal Plan?: No Has patient had any suicidal plan within the past 6 months prior to admission? : No Access to Means: No What has been your use of drugs/alcohol within the last 12 months?: hx of THC use Previous Attempts/Gestures: No How many times?: 0 Other Self Harm Risks: denied Triggers for Past Attempts: None known Intentional Self Injurious Behavior: None Family Suicide History: No Recent stressful life event(s): Other (Comment)(none reported) Persecutory voices/beliefs?: Yes Depression: No Substance abuse history and/or treatment for substance abuse?: No Suicide prevention information given to non-admitted patients: Not applicable  Risk to Others within the past 6 months Homicidal Ideation: No Does patient have any lifetime risk of violence toward others beyond the six months prior to admission? : No Thoughts of Harm to Others: No Current Homicidal Intent: No Current Homicidal Plan: No Access to Homicidal Means: No Identified Victim: none History of harm to others?: No Assessment of Violence: None Noted Violent Behavior Description: none Does patient have access to weapons?: No Criminal Charges Pending?: No Does patient have a court date: No Is patient on probation?: No  Psychosis Hallucinations: Auditory Delusions: None noted  Mental Status Report Appearance/Hygiene: Poor hygiene, In scrubs Eye Contact: Good Motor Activity: Freedom of movement Speech: Logical/coherent Level of Consciousness: Alert Mood: Euthymic, Pleasant Affect: Appropriate to circumstance Anxiety Level: None Thought Processes: Coherent, Relevant Judgement: Partial Orientation: Person, Place, Time, Situation, Appropriate for developmental age Obsessive Compulsive Thoughts/Behaviors: Unable to Assess  Cognitive Functioning Concentration: Normal Memory: Recent Intact, Remote Intact Is patient IDD: No Insight: Poor Impulse Control: Fair Appetite:  Good Have you had any weight changes? : No Change Sleep: No Change Total Hours of Sleep: 8 Vegetative Symptoms: Decreased grooming, Not bathing  ADLScreening [facility] Assessment Services) Patient's cognitive ability adequate to safely complete daily activities?: Yes Patient able to express need for assistance with ADLs?: Yes Independently performs ADLs?: Yes (appropriate for developmental age)  Prior Inpatient Therapy Prior Inpatient Therapy: Yes Prior Therapy Dates: 10/2018, 2018 Prior Therapy Facilty/Provider(s): [facility], [facility] Reason for Treatment: Psychosis.   Prior Outpatient Therapy Prior Outpatient Therapy: Yes Prior Therapy Dates: current Prior Therapy Facilty/Provider(s): [facility] Reason for Treatment: Medication management. Does patient have an ACCT team?: No Does patient have Intensive In-House Services?  : No Does patient have [facility] services? : No Does patient have P4CC services?: No  ADL Screening (condition at time of admission) Patient's cognitive ability adequate to safely complete daily activities?: Yes Is the patient deaf or have difficulty hearing?: No Does the patient have difficulty seeing, even when wearing glasses/contacts?: No Does the patient have difficulty concentrating, remembering, or making decisions?: No Patient able to express need for assistance with ADLs?: Yes Does the patient have difficulty dressing or bathing?: No Independently performs ADLs?: Yes (appropriate for developmental age) Does the patient have difficulty walking or climbing stairs?: No Weakness of Legs: None Weakness of Arms/Hands: None  Home Assistive Devices/Equipment Home Assistive Devices/Equipment: None  Therapy Consults (therapy consults require a physician order) PT Evaluation Needed: No OT Evalulation Needed: No SLP Evaluation Needed: No Abuse/Neglect Assessment (Assessment to be complete while patient is alone) Physical Abuse: Denies Verbal Abuse: Denies Sexual  Abuse: Denies Exploitation of patient/patient's  resources: Denies Self-Neglect: Denies Values / Beliefs Cultural Requests During Hospitalization: None Spiritual Requests During Hospitalization: None Consults Spiritual Care Consult Needed: No Social Work Consult Needed: No Merchant navy officerAdvance Directives (For Healthcare) Does Patient Have a Medical Advance Directive?: No Would patient like information on creating a medical advance directive?: No - Patient declined          Disposition: Per Elta GuadeloupeLaurie Parks, NP patient does not meet in patient criteria. Disposition Initial Assessment Completed for this Encounter: Yes Patient referred to: Outpatient clinic referral, Other (Comment)  On Site Evaluation by:   Reviewed with Physician:    Celedonio MiyamotoMeredith  Kristyanna Barcelo 12/27/2018 9:07 AM

## 2018-12-27 NOTE — BH Assessment (Addendum)
[facility] Assessment Progress Note  Per Jacqueline Norman, DO, this pt does not require psychiatric hospitalization at this time.  [facility] from [facility] with recommendation to continue treatment with [facility].  This has been included in pt's discharge instructions.  Pt's nurse, Olivette, has been notified.  Tracey Hermance, MA Triage Specialist [phone]     

## 2018-12-28 ENCOUNTER — Encounter (HOSPITAL_COMMUNITY): Payer: Self-pay | Admitting: Emergency Medicine

## 2018-12-28 ENCOUNTER — Emergency Department (HOSPITAL_COMMUNITY)
Admission: EM | Admit: 2018-12-28 | Discharge: 2018-12-28 | Disposition: A | Discharge status: Home / Self Care | Payer: Self-pay | Attending: Emergency Medicine | Admitting: Emergency Medicine

## 2018-12-28 ENCOUNTER — Other Ambulatory Visit: Payer: Self-pay

## 2018-12-28 DIAGNOSIS — R440 Auditory hallucinations: Secondary | ICD-10-CM | POA: Insufficient documentation

## 2018-12-28 DIAGNOSIS — F333 Major depressive disorder, recurrent, severe with psychotic symptoms: Secondary | ICD-10-CM | POA: Insufficient documentation

## 2018-12-28 DIAGNOSIS — Z590 Homelessness: Secondary | ICD-10-CM | POA: Insufficient documentation

## 2018-12-28 DIAGNOSIS — F17210 Nicotine dependence, cigarettes, uncomplicated: Secondary | ICD-10-CM | POA: Insufficient documentation

## 2018-12-28 DIAGNOSIS — F209 Schizophrenia, unspecified: Secondary | ICD-10-CM | POA: Insufficient documentation

## 2018-12-28 MED ORDER — RISPERIDONE 1 MG PO TABS
1.0000 mg | ORAL_TABLET | Freq: Once | ORAL | Status: AC
  Administered 2018-12-28: 1 mg via ORAL
  Filled 2018-12-28 (×2): qty 1

## 2018-12-28 NOTE — ED Provider Notes (Signed)
[name] EMERGENCY DEPARTMENT Provider Note   CSN: [id number] Arrival date & time: 12/28/18  0246     History   Chief Complaint Chief Complaint  Patient presents with  . Hallucinations    auditory    HPI Cole Barrett is a 33 y.o. male with history of schizophrenia, paranoid behavior, PE presenting to the emergency department with chief complaint of auditory hallucinations.  He reports the voices are telling him that they are going to kill him.  Pt was recently started on Risperdal and reports he has been out of pills x 1 day. He denies any suicidal or homicidal thoughts.  No visual hallucinations. He was seen yesterday at [facility] for similar complaint and given 1 dose of Risperdal and told to follow up with [facility].  Patient denies any associated symptoms. Pt denies illicit drug use.  History provided by patient.   Past Medical History:  Diagnosis Date  . Homelessness   . Hypercholesterolemia   . Mental disorder   . Paranoid behavior (HCC)   . PE (pulmonary embolism)   . Schizophrenia [facility])     Patient Active Problem List   Diagnosis Date Noted  . 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 Alcohol abuse   . Auditory hallucinations   . Alcohol abuse with alcohol-induced mood disorder (HCC) 09/08/2016  . Homelessness 09/03/2016  . Person feigning illness 09/03/2016  . 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 Cannabis use disorder, mild, abuse 08/29/2016  . Pulmonary emboli (HCC) 07/13/2016  . Adjustment disorder with emotional disturbance 03/12/2014  . 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 Acute left flank pain 01/02/2014  . Pulmonary embolism, bilateral (HCC) 01/02/2014    History reviewed. No pertinent surgical history.      Home Medications    Prior to Admission medications   Medication Sig Start Date End Date Taking? Authorizing Provider  benztropine  (COGENTIN) 0.5 MG tablet Take 1 tablet (0.5 mg total) by mouth 2 (two) times daily. 12/27/18   Laveda Abbe, NP  risperiDONE (RISPERDAL) 1 MG tablet Take 1 tablet (1 mg total) by mouth 2 (two) times daily for 10 days. 12/27/18 01/06/19  Laveda Abbe, NP    Family History Family History  Problem Relation Age of Onset  . Hypertension Mother     Social History Social History   Tobacco Use  . Smoking status: Current Every Day Smoker    Packs/day: 0.50    Types: Cigarettes  . Smokeless tobacco: Never Used  Substance Use Topics  . Alcohol use: Yes  . Drug use: Yes    Types: Marijuana    Comment: Last used 12/06/2018     Allergies   Haldol [haloperidol]   Review of Systems Review of Systems  Psychiatric/Behavioral: Positive for hallucinations (auditory). Negative for agitation, self-injury and suicidal ideas.  All other systems reviewed and are negative.    Physical Exam Updated Vital Signs BP 115/76   Pulse 95   Temp (!) 97.3 F (36.3 C) (Oral)   Resp 20   SpO2 99%   Physical Exam Constitutional:      Appearance: Normal appearance. He is well-developed.  HENT:     Head: Normocephalic and atraumatic.  Eyes:     General: No scleral icterus.    Conjunctiva/sclera: Conjunctivae normal.     Pupils: Pupils are equal, round, and reactive to light.     Comments: EOM grossly intact  Cardiovascular:  Rate and Rhythm: Normal rate and regular rhythm.  Pulmonary:     Effort: Pulmonary effort is normal.  Abdominal:     Palpations: Abdomen is soft.     Tenderness: There is no abdominal tenderness.  Musculoskeletal: Normal range of motion.  Skin:    General: Skin is warm and dry.  Neurological:     Mental Status: He is alert and oriented to person, place, and time.  Psychiatric:        Attention and Perception: He perceives auditory hallucinations. He does not perceive visual hallucinations.        Speech: Speech normal.        Thought Content: Thought  content does not include homicidal or suicidal ideation.      ED Treatments / Results  Labs (all labs ordered are listed, but only abnormal results are displayed) Labs Reviewed - No data to display  EKG None  Radiology No results found.  Procedures Procedures (including critical care time)  Medications Ordered in ED Medications  risperiDONE (RISPERDAL) tablet 1 mg (1 mg Oral Given 12/28/18 0449)     Initial Impression / Assessment and Plan / ED Course  I have reviewed the triage vital signs and the nursing notes.  Pertinent labs & imaging results that were available during my care of the patient were reviewed by me and considered in my medical decision making (see chart for details).    Pt with history of schizophrenia and paranoid behavior presenting with auditory hallucinations. He was recently started on Risperdal and reports he has been out of the medication x 1 day. [facility] and given his home dose of Risperdal. Looking through EMR at yesterday's visit psych did not feel he requires psychiatric hospitalization at that time.  Pt appears stable. He denies suicidal and homicidal ideations at this time. Will give home dose of Risperdal and discharge with plan to [facility] for [facility] and outpatient psychiatric treatment.  Discussed strict ED return precautions. Pt verbalized understanding of and is in agreement with this plan. Pt stable for discharge home at this time.   Final Clinical Impressions(s) / ED Diagnoses   Final diagnoses:  Auditory hallucination    ED Discharge Orders    None       Kathyrn Lasslbrizze, Darnita Woodrum E, PA-C 12/28/18 [id number]    Nira Connardama, Pedro Eduardo, MD 12/30/18 [phone]

## 2018-12-28 NOTE — Discharge Instructions (Addendum)
You have been seen today for hearing voices. Please read and follow all provided instructions. If you have suicidal or homicidal thoughts return to the emergency department.  1. Medications: Continue usual home medications 2. Treatment: rest, drink plenty of fluids 3. Follow Up: Please follow up with [facility] today for a refill of your Risperdal. It is important that you see them TODAY so you are able to get your medications refilled.     Please return to the ER for any new or worsening symptoms.   Take medications as prescribed. Please review all of the medicines and only take them if you do not have an allergy to them. Return to the emergency room for worsening condition or new concerning symptoms. Follow up with your regular doctor. If you don't have a regular doctor use one of the numbers below to establish a primary care doctor.  ?  It is also a possibility that you have an allergic reaction to any of the medicines that you have been prescribed - Everybody reacts differently to medications and while MOST people have no trouble with most medicines, you may have a reaction such as nausea, vomiting, rash, swelling, shortness of breath. If this is the case, please stop taking the medicine immediately and contact your physician.  ?  You should return to the ER if you develop severe or worsening symptoms.

## 2018-12-28 NOTE — ED Triage Notes (Addendum)
Pt reports he is hearing voices, denies visual hallucinations. Pt reports he is out of his medication Risperdal, reports he [facility]. Pt denies SI. Pt calm, answers questions appropriately.

## 2018-12-28 NOTE — ED Notes (Signed)
Patient verbalizes understanding of discharge instructions. Opportunity for questioning and answers were provided. Armband removed by staff, pt discharged from ED. Ambulated out to lobby  

## 2019-01-08 ENCOUNTER — Encounter (HOSPITAL_COMMUNITY): Payer: Self-pay

## 2019-01-08 ENCOUNTER — Emergency Department (HOSPITAL_COMMUNITY)
Admission: EM | Admit: 2019-01-08 | Discharge: 2019-01-08 | Disposition: A | Discharge status: Home / Self Care | Payer: Self-pay | Attending: Emergency Medicine | Admitting: Emergency Medicine

## 2019-01-08 DIAGNOSIS — F17210 Nicotine dependence, cigarettes, uncomplicated: Secondary | ICD-10-CM | POA: Insufficient documentation

## 2019-01-08 DIAGNOSIS — F209 Schizophrenia, unspecified: Secondary | ICD-10-CM | POA: Insufficient documentation

## 2019-01-08 MED ORDER — RISPERIDONE 1 MG PO TABS
1.0000 mg | ORAL_TABLET | Freq: Two times a day (BID) | ORAL | Status: DC
  Administered 2019-01-08: 1 mg via ORAL
  Filled 2019-01-08: qty 1

## 2019-01-08 NOTE — ED Notes (Signed)
ED Provider at bedside. 

## 2019-01-08 NOTE — Discharge Instructions (Addendum)
Make sure you pick up your medication at [facility] later today.

## 2019-01-08 NOTE — ED Provider Notes (Signed)
[facility]-EMERGENCY DEPT Provider Note   CSN: [id number] Arrival date & time: 01/08/19  0417     History   Chief Complaint Chief Complaint  Patient presents with  . Medication Refill    HPI Cole Barrett is a 33 y.o. male.  HPI   He is here because he is out of his Risperdal.  He states [facility] on [facility].  He seeks a dose at this time.  He has no other complaints.  Past Medical History:  Diagnosis Date  . Homelessness   . Hypercholesterolemia   . Mental disorder   . Paranoid behavior (HCC)   . PE (pulmonary embolism)   . Schizophrenia [facility](HCC)     Patient Active Problem List   Diagnosis Date Noted  . 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 Alcohol abuse   . Auditory hallucinations   . Alcohol abuse with alcohol-induced mood disorder (HCC) 09/08/2016  . Homelessness 09/03/2016  . Person feigning illness 09/03/2016  . 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 Cannabis use disorder, mild, abuse 08/29/2016  . Pulmonary emboli (HCC) 07/13/2016  . Adjustment disorder with emotional disturbance 03/12/2014  . 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 Acute left flank pain 01/02/2014  . Pulmonary embolism, bilateral (HCC) 01/02/2014    History reviewed. No pertinent surgical history.      Home Medications    Prior to Admission medications   Medication Sig Start Date End Date Taking? Authorizing Provider  benztropine (COGENTIN) 0.5 MG tablet Take 1 tablet (0.5 mg total) by mouth 2 (two) times daily. 12/27/18   Laveda AbbeParks, Laurie Britton, NP  risperiDONE (RISPERDAL) 1 MG tablet Take 1 tablet (1 mg total) by mouth 2 (two) times daily for 10 days. 12/27/18 01/06/19  Laveda AbbeParks, Laurie Britton, NP    Family History Family History  Problem Relation Age of Onset  . Hypertension Mother     Social History Social History   Tobacco Use  . Smoking status:  Current Every Day Smoker    Packs/day: 0.50    Types: Cigarettes  . Smokeless tobacco: Never Used  Substance Use Topics  . Alcohol use: Yes  . Drug use: Yes    Types: Marijuana    Comment: Last used 12/06/2018     Allergies   Haldol [haloperidol]   Review of Systems Review of Systems  All other systems reviewed and are negative.    Physical Exam Updated Vital Signs BP 127/78 (BP Location: Left Arm)   Pulse 81   Temp 97.8 F (36.6 C) (Oral)   Resp 16   Ht 5\' 10"  (1.778 m)   Wt 99.8 kg   SpO2 99%   BMI 31.57 kg/m   Physical Exam Vitals signs and nursing note reviewed.  Constitutional:      Appearance: Normal appearance. He is well-developed. He is not ill-appearing.     Comments: Sitting comfortably, conversant and cooperative.  HENT:     Head: Normocephalic and atraumatic.     Right Ear: External ear normal.     Left Ear: External ear normal.  Eyes:     Conjunctiva/sclera: Conjunctivae normal.     Pupils: Pupils are equal, round, and reactive to light.  Neck:     Musculoskeletal: Normal range of motion and neck supple.     Trachea: Phonation normal.  Cardiovascular:     Rate and Rhythm: Normal rate.     Heart sounds: Normal  heart sounds.  Pulmonary:     Effort: Pulmonary effort is normal.  Musculoskeletal: Normal range of motion.  Skin:    General: Skin is warm and dry.  Neurological:     Mental Status: He is alert and oriented to person, place, and time.     Cranial Nerves: No cranial nerve deficit.     Sensory: No sensory deficit.     Motor: No abnormal muscle tone.     Coordination: Coordination normal.  Psychiatric:        Mood and Affect: Mood normal.        Behavior: Behavior normal.      ED Treatments / Results  Labs (all labs ordered are listed, but only abnormal results are displayed) Labs Reviewed - No data to display  EKG None  Radiology No results found.  Procedures Procedures (including critical care time)  Medications  Ordered in ED Medications  risperiDONE (RISPERDAL) tablet 1 mg (1 mg Oral Given 01/08/19 0829)     Initial Impression / Assessment and Plan / ED Course  I have reviewed the triage vital signs and the nursing notes.  Pertinent labs & imaging results that were available during my care of the patient were reviewed by me and considered in my medical decision making (see chart for details).      Patient Vitals for the past 24 hrs:  BP Temp Temp src Pulse Resp SpO2 Height Weight  01/08/19 0830 127/78 - - 81 16 99 % - -  01/08/19 0701 (!) 149/78 97.8 F (36.6 C) Oral 74 18 99 % 5\' 10"  (1.778 m) 99.8 kg    8:39 AM Reevaluation with update and discussion. After initial assessment and treatment, an updated evaluation reveals no change in status, findings discussed and questions answered. Mancel Bale   Medical Decision Making: Schizophrenia, stable, currently out of medication.  Patient has access to receiving medicines later today at [facility].  CRITICAL CARE-no  Nursing Notes Reviewed/ Care Coordinated Applicable Imaging Reviewed Interpretation of Laboratory Data incorporated into ED treatment  The patient appears reasonably screened and/or stabilized for discharge and I doubt any other medical condition or other [facility] requiring further screening, evaluation, or treatment in the ED at this time prior to discharge.  Plan: Home Medications-continue usual; Home Treatments-rest; return here if the recommended treatment, does not improve the symptoms; Recommended follow up-psychiatry and PCP as needed  Performed by: Mancel Bale    Final Clinical Impressions(s) / ED Diagnoses   Final diagnoses:  Schizophrenia, unspecified type [facility])    ED Discharge Orders    None       Mancel Bale, MD 01/08/19 [phone]

## 2019-01-08 NOTE — ED Notes (Signed)
Bed: [facility] Expected date:  Expected time:  Means of arrival:  Comments: 

## 2019-01-08 NOTE — ED Notes (Signed)
Pt d/c home per MD order. Discharge summary reviewed with pt. Pt verbalizes understanding. Signed e-signature. Ambulatory off unit.

## 2019-01-08 NOTE — ED Triage Notes (Signed)
Pt states that he's out of his risperdal, he thinks he can get more tomorrow but needs it until then

## 2019-01-16 ENCOUNTER — Other Ambulatory Visit: Payer: Self-pay

## 2019-01-16 ENCOUNTER — Emergency Department (HOSPITAL_COMMUNITY)
Admission: EM | Admit: 2019-01-16 | Discharge: 2019-01-16 | Disposition: A | Discharge status: Home / Self Care | Payer: Self-pay | Attending: Emergency Medicine | Admitting: Emergency Medicine

## 2019-01-16 ENCOUNTER — Encounter (HOSPITAL_COMMUNITY): Payer: Self-pay

## 2019-01-16 DIAGNOSIS — R440 Auditory hallucinations: Secondary | ICD-10-CM | POA: Insufficient documentation

## 2019-01-16 DIAGNOSIS — Z79899 Other long term (current) drug therapy: Secondary | ICD-10-CM | POA: Insufficient documentation

## 2019-01-16 DIAGNOSIS — F17210 Nicotine dependence, cigarettes, uncomplicated: Secondary | ICD-10-CM | POA: Insufficient documentation

## 2019-01-16 NOTE — ED Triage Notes (Signed)
Pt here because he is out of his Risperdal and states he's beginning to hear voices again.  A&Ox4 no other complaints.

## 2019-01-16 NOTE — ED Provider Notes (Signed)
[name]   CSN: [id number] Arrival date & time: 01/16/19  0051    History   Chief Complaint Chief Complaint  Patient presents with  . Medication Refill    HPI Cole Barrett is a 33 y.o. male.     The history is provided by the patient and medical records. No language interpreter was used.  Mental Health Problem  Presenting symptoms: hallucinations   Presenting symptoms: no aggressive behavior, no agitation, no homicidal ideas, no paranoid behavior, no self-mutilation, no suicidal thoughts, no suicidal threats and no suicide attempt   Degree of incapacity (severity):  Moderate Onset quality:  Gradual Duration:  1 day Timing:  Constant Progression:  Improving Chronicity:  Recurrent Context: not alcohol use, not drug abuse, not medication, not noncompliant, not recent medication change and not stressful life event   Treatment compliance:  Untreated Relieved by:  Nothing Associated symptoms: no abdominal pain, no chest pain, no fatigue and no headaches     Past Medical History:  Diagnosis Date  . Homelessness   . Hypercholesterolemia   . Mental disorder   . Paranoid behavior (HCC)   . PE (pulmonary embolism)   . Schizophrenia [facility](HCC)     Patient Active Problem List   Diagnosis Date Noted  . 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 Alcohol abuse   . Auditory hallucinations   . Alcohol abuse with alcohol-induced mood disorder (HCC) 09/08/2016  . Homelessness 09/03/2016  . Person feigning illness 09/03/2016  . 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 Cannabis use disorder, mild, abuse 08/29/2016  . Pulmonary emboli (HCC) 07/13/2016  . Adjustment disorder with emotional disturbance 03/12/2014  . 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 Acute left flank pain 01/02/2014  . Pulmonary embolism, bilateral (HCC) 01/02/2014    History  reviewed. No pertinent surgical history.      Home Medications    Prior to Admission medications   Medication Sig Start Date End Date Taking? Authorizing Provider  benztropine (COGENTIN) 0.5 MG tablet Take 1 tablet (0.5 mg total) by mouth 2 (two) times daily. 12/27/18   Laveda AbbeParks, Laurie Britton, NP  risperiDONE (RISPERDAL) 1 MG tablet Take 1 tablet (1 mg total) by mouth 2 (two) times daily for 10 days. 12/27/18 01/06/19  Laveda AbbeParks, Laurie Britton, NP    Family History Family History  Problem Relation Age of Onset  . Hypertension Mother     Social History Social History   Tobacco Use  . Smoking status: Current Every Day Smoker    Packs/day: 0.50    Types: Cigarettes  . Smokeless tobacco: Never Used  Substance Use Topics  . Alcohol use: Yes  . Drug use: Yes    Types: Marijuana    Comment: Last used 12/06/2018     Allergies   Haldol [haloperidol]   Review of Systems Review of Systems  Constitutional: Negative for activity change, chills, diaphoresis, fatigue and fever.  HENT: Negative for congestion and rhinorrhea.   Eyes: Negative for visual disturbance.  Respiratory: Negative for cough, chest tightness, shortness of breath, wheezing and stridor.   Cardiovascular: Negative for chest pain, palpitations and leg swelling.  Gastrointestinal: Negative for abdominal distention, abdominal pain, blood in stool, constipation, diarrhea, nausea and vomiting.  Genitourinary: Negative for difficulty urinating, dysuria and flank pain.  Musculoskeletal: Negative for back pain and gait problem.  Skin: Negative for rash and wound.  Neurological: Negative for dizziness, weakness, light-headedness, numbness and  headaches.  Psychiatric/Behavioral: Positive for hallucinations. Negative for agitation, homicidal ideas, paranoia, self-injury and suicidal ideas.  All other systems reviewed and are negative.    Physical Exam Updated Vital Signs BP (!) 135/59 (BP Location: Right Arm)   Pulse 97    Temp 98.5 F (36.9 C) (Oral)   Resp 20   SpO2 98%   Physical Exam Vitals signs and nursing note reviewed.  Constitutional:      General: He is not in acute distress.    Appearance: He is well-developed. He is not ill-appearing, toxic-appearing or diaphoretic.  HENT:     Head: Normocephalic and atraumatic.     Nose: No congestion or rhinorrhea.     Mouth/Throat:     Pharynx: No oropharyngeal exudate or posterior oropharyngeal erythema.  Eyes:     Conjunctiva/sclera: Conjunctivae normal.  Neck:     Musculoskeletal: Neck supple.  Cardiovascular:     Rate and Rhythm: Normal rate and regular rhythm.     Heart sounds: No murmur.  Pulmonary:     Effort: Pulmonary effort is normal. No respiratory distress.     Breath sounds: Normal breath sounds.  Abdominal:     Palpations: Abdomen is soft.     Tenderness: There is no abdominal tenderness. There is no right CVA tenderness or left CVA tenderness.  Musculoskeletal:        General: No tenderness.  Skin:    General: Skin is warm and dry.  Neurological:     Mental Status: He is alert and oriented to person, place, and time.     Sensory: No sensory deficit.     Motor: No weakness.     Coordination: Coordination normal.  Psychiatric:        Attention and Perception: He perceives auditory hallucinations. He does not perceive visual hallucinations.        Mood and Affect: Affect is not angry.        Behavior: Behavior is not agitated or aggressive.        Thought Content: Thought content is not paranoid or delusional. Thought content does not include homicidal or suicidal ideation. Thought content does not include homicidal or suicidal plan.      ED Treatments / Results  Labs (all labs ordered are listed, but only abnormal results are displayed) Labs Reviewed - No data to display  EKG None  Radiology No results found.  Procedures Procedures (including critical care time)  Medications Ordered in ED Medications - No data to  display   Initial Impression / Assessment and Plan / ED Course  I have reviewed the triage vital signs and the nursing notes.  Pertinent labs & imaging results that were available during my care of the patient were reviewed by me and considered in my medical decision making (see chart for details).        Cole Barrett is a 33 y.o. male with a past medical history significant for schizophrenia, paranoia, hypercholesterolemia, prior pulmonary embolism, homelessness, and homelessness who presents with auditory elucidation's.  Patient reports that last week he was seen and was given a refill on his Risperdal.  He reports this normally helps with his auditory hallucinations.  He reports that his auditory decisions worsen last night and he presented for evaluation.  He reports that he took his Risperdal when he was checking and and while in the waiting room his symptoms have nearly resolved.  He reports his voices have lessened significantly and he feels safe.  He denies  any homicidal ideation or suicidal ideation.  He denies any recent drug use.  He reports that he is feeling much better and the voices are not bothering him currently.  He says that when they do talk to him loudly they tell him to hurt himself but he has never hurt himself before.  He reports that he would like to be discharged to follow-up with [facility].  On my exam, lungs clear chest nontender.  Abdomen nontender.  Patient moving all extremities and has normal gait.  Patient appears well.  Patient resting comfortably.  Patient denies SI and HI and reports improving hallucinations.  Given patient's report that he feels safe and does not want hurt himself or others and that his voices are improving, we feel he is safe for discharge with outpatient follow-up.  Patient reports he will continue taking his Risperdal and will follow-up with [facility] today or tomorrow.  He understands return precautions for any return or worsening  symptoms.  Patient other questions or concerns and was discharged in good condition to continue outpatient management.  Final Clinical Impressions(s) / ED Diagnoses   Final diagnoses:  Auditory hallucinations    ED Discharge Orders    None      Clinical Impression: 1. Auditory hallucinations     Disposition: Discharge  Condition: Good  I have discussed the results, Dx and Tx plan with the pt(& family if present). He/she/they expressed understanding and agree(s) with the plan. Discharge instructions discussed at great length. Strict return precautions discussed and pt &/or family have verbalized understanding of the instructions. No further questions at time of discharge.    Discharge Medication List as of 01/16/2019  7:54 AM      Follow Up: [facility] [place] [address] [phone]     [facility] EMERGENCY DEPARTMENT [address] [id number] mc [place] [address] [phone]       Tegeler, Canary Brim, MD 01/16/19 [phone]

## 2019-01-16 NOTE — ED Notes (Signed)
Patient given discharge instructions and verbalized understanding.  Patient stable to discharge at this time.  Patient is alert and oriented to baseline.  No distressed noted at this time.  All belongings taken with the patient at discharge.   

## 2019-01-16 NOTE — Discharge Instructions (Signed)
Based on our history and exam, we were concerned that you were having auditory hallucinations causing you to have paranoid feelings last night.  As you took your medications were prescribed last week and your symptoms improved and you are denying any homicidal or suicidal ideations, we feel you are safe for discharge and follow-up with [facility].  Please go see them in the next 24 to 48 hours and continue taking your medications.  Please rest.  Please stay hydrated and avoid drugs and alcohol.  If any symptoms change or worsen or he started having concerning feelings, please return to the nearest emergency department medially.

## 2019-01-18 ENCOUNTER — Emergency Department (HOSPITAL_COMMUNITY)
Admission: EM | Admit: 2019-01-18 | Discharge: 2019-01-19 | Disposition: A | Discharge status: Home / Self Care | Payer: Self-pay | Attending: Emergency Medicine | Admitting: Emergency Medicine

## 2019-01-18 ENCOUNTER — Other Ambulatory Visit: Payer: Self-pay

## 2019-01-18 DIAGNOSIS — F1099 Alcohol use, unspecified with unspecified alcohol-induced disorder: Secondary | ICD-10-CM | POA: Insufficient documentation

## 2019-01-18 DIAGNOSIS — Z590 Homelessness: Secondary | ICD-10-CM | POA: Insufficient documentation

## 2019-01-18 DIAGNOSIS — Z008 Encounter for other general examination: Secondary | ICD-10-CM

## 2019-01-18 DIAGNOSIS — Z789 Other specified health status: Secondary | ICD-10-CM

## 2019-01-18 DIAGNOSIS — Z046 Encounter for general psychiatric examination, requested by authority: Secondary | ICD-10-CM | POA: Insufficient documentation

## 2019-01-18 DIAGNOSIS — Z7289 Other problems related to lifestyle: Secondary | ICD-10-CM

## 2019-01-18 DIAGNOSIS — Z79899 Other long term (current) drug therapy: Secondary | ICD-10-CM | POA: Insufficient documentation

## 2019-01-18 DIAGNOSIS — Z720 Tobacco use: Secondary | ICD-10-CM

## 2019-01-18 DIAGNOSIS — R440 Auditory hallucinations: Secondary | ICD-10-CM | POA: Diagnosis present

## 2019-01-18 DIAGNOSIS — F17210 Nicotine dependence, cigarettes, uncomplicated: Secondary | ICD-10-CM | POA: Insufficient documentation

## 2019-01-18 DIAGNOSIS — F250 Schizoaffective disorder, bipolar type: Secondary | ICD-10-CM | POA: Diagnosis present

## 2019-01-18 LAB — RAPID URINE DRUG SCREEN, HOSP PERFORMED
Amphetamines: NOT DETECTED
Barbiturates: NOT DETECTED
Benzodiazepines: NOT DETECTED
Cocaine: NOT DETECTED
Opiates: NOT DETECTED
Tetrahydrocannabinol: NOT DETECTED

## 2019-01-18 LAB — CBC WITH DIFFERENTIAL/PLATELET
Abs Immature Granulocytes: 0.05 10*3/uL (ref 0.00–0.07)
Basophils Absolute: 0 10*3/uL (ref 0.0–0.1)
Basophils Relative: 1 %
Eosinophils Absolute: 0.2 10*3/uL (ref 0.0–0.5)
Eosinophils Relative: 5 %
HCT: 36.7 % — ABNORMAL LOW (ref 39.0–52.0)
Hemoglobin: 11.5 g/dL — ABNORMAL LOW (ref 13.0–17.0)
Immature Granulocytes: 1 %
Lymphocytes Relative: 35 %
Lymphs Abs: 1.3 10*3/uL (ref 0.7–4.0)
MCH: 27.8 pg (ref 26.0–34.0)
MCHC: 31.3 g/dL (ref 30.0–36.0)
MCV: 88.6 fL (ref 80.0–100.0)
Monocytes Absolute: 0.5 10*3/uL (ref 0.1–1.0)
Monocytes Relative: 14 %
Neutro Abs: 1.6 10*3/uL — ABNORMAL LOW (ref 1.7–7.7)
Neutrophils Relative %: 44 %
Platelets: 279 10*3/uL (ref 150–400)
RBC: 4.14 MIL/uL — ABNORMAL LOW (ref 4.22–5.81)
RDW: 13 % (ref 11.5–15.5)
WBC Morphology: ABNORMAL
WBC: 3.7 10*3/uL — ABNORMAL LOW (ref 4.0–10.5)
nRBC: 0 % (ref 0.0–0.2)

## 2019-01-18 LAB — COMPREHENSIVE METABOLIC PANEL
ALBUMIN: 3.8 g/dL (ref 3.5–5.0)
ALT: 35 U/L (ref 0–44)
ANION GAP: 7 (ref 5–15)
AST: 24 U/L (ref 15–41)
Alkaline Phosphatase: 44 U/L (ref 38–126)
BUN: 11 mg/dL (ref 6–20)
CO2: 24 mmol/L (ref 22–32)
Calcium: 8.6 mg/dL — ABNORMAL LOW (ref 8.9–10.3)
Chloride: 108 mmol/L (ref 98–111)
Creatinine, Ser: 0.88 mg/dL (ref 0.61–1.24)
GFR calc Af Amer: >60 mL/min (ref >60)
GFR calc non Af Amer: >60 mL/min (ref >60)
Glucose, Bld: 84 mg/dL (ref 70–99)
Potassium: 3.9 mmol/L (ref 3.5–5.1)
Sodium: 139 mmol/L (ref 135–145)
Total Bilirubin: 0.5 mg/dL (ref 0.3–1.2)
Total Protein: 6.9 g/dL (ref 6.5–8.1)

## 2019-01-18 LAB — ACETAMINOPHEN LEVEL

## 2019-01-18 LAB — SALICYLATE LEVEL: Salicylate Lvl: <7 mg/dL (ref 2.8–30.0)

## 2019-01-18 LAB — ETHANOL: Alcohol, Ethyl (B): <10 mg/dL (ref <10)

## 2019-01-18 MED ORDER — ZOLPIDEM TARTRATE 5 MG PO TABS: 5.0000 mg | ORAL_TABLET | Freq: Every evening | ORAL | Status: DC | PRN

## 2019-01-18 MED ORDER — BENZTROPINE MESYLATE 0.5 MG PO TABS
0.5000 mg | ORAL_TABLET | Freq: Two times a day (BID) | ORAL | Status: DC
  Administered 2019-01-18 – 2019-01-19 (×3): 0.5 mg via ORAL
  Filled 2019-01-18 (×3): qty 1

## 2019-01-18 MED ORDER — ONDANSETRON HCL 4 MG PO TABS: 4.0000 mg | ORAL_TABLET | Freq: Three times a day (TID) | ORAL | Status: DC | PRN

## 2019-01-18 MED ORDER — ACETAMINOPHEN 325 MG PO TABS: 650.0000 mg | ORAL_TABLET | ORAL | Status: DC | PRN

## 2019-01-18 MED ORDER — RISPERIDONE 2 MG PO TABS
4.0000 mg | ORAL_TABLET | Freq: Every day | ORAL | Status: DC
  Administered 2019-01-18: 4 mg via ORAL
  Filled 2019-01-18: qty 2

## 2019-01-18 MED ORDER — ALUM & MAG HYDROXIDE-SIMETH 200-200-20 MG/5ML PO SUSP: 30.0000 mL | Freq: Four times a day (QID) | ORAL | Status: DC | PRN

## 2019-01-18 MED ORDER — NICOTINE 21 MG/24HR TD PT24: 21.0000 mg | MEDICATED_PATCH | Freq: Every day | TRANSDERMAL | Status: DC

## 2019-01-18 MED ORDER — RISPERIDONE 1 MG PO TABS
1.0000 mg | ORAL_TABLET | Freq: Every morning | ORAL | Status: DC
  Administered 2019-01-18 – 2019-01-19 (×2): 1 mg via ORAL
  Filled 2019-01-18 (×2): qty 1

## 2019-01-18 NOTE — Progress Notes (Addendum)
Patient ID: Cole Barrett, male   DOB: 01-11-1986, 33 y.o.   MRN: [id number]  Pt was seen and chart reviewed with treatment team and Dr Sharma Covert. Pt denies suicidal/homicidal ideation, denies visual hallucinations and does not appear to be responding to internal stimuli. Pt stated he is hearing voices. He stated he took his Risperdal this morning but the voices did not get better so he thought he should come in to the hospital. He stated he has not been to [facility] in a while and when he did go he lost his prescription. He stated he does have some Risperdal in his coat. He frequently comes in to the emergency room for auditory hallucinations. These are chronic in nature. He has not had labs completed since December 27, 2017. This Clinical research associate spoke with [place], [place] and discussed the case with her. We will draw labs, hold the patient overnight and give his nighttime and morning Risperdal. Anticipate pt to discharge in the AM if stable.   Laveda Abbe, FNP-C 01/18/2019        1504  Patient's chart reviewed and case discussed with the physician extender and developed treatment plan. Reviewed the information documented and agree with the treatment plan.  Juanetta Beets, DO 01/18/19 7:19 PM

## 2019-01-18 NOTE — ED Notes (Signed)
Patient changed into paper scrubs and wanded by security. 

## 2019-01-18 NOTE — BH Assessment (Addendum)
Assessment Note  Cole Barrett is an 33 y.o. male that presents this date with AH. Patient denies any S/I, H/I or VH. Patient has a history of depression and schizophrenia with complaints of auditory hallucinations hearing voices telling him they are going to kill him. Patient reports he goes to [facility] for medication management. Patient reports sporadic THC use and denies any other substance use. UDS pending this date. Patient denies any current criminal charges. Patient is well known to this ED and was last seen on 12/27/18 when he presented with similar symptoms. Patient was alert and oriented x 4. He was dressed in scrubs and presented with appropriate affect. Patient's insight and impulse control are fair, however exhibits poor judgement. Patient did not appear to be responding to internal stimuli or experiencing delusional thought content at time of assessment. Patient states he is currently homeless and reports he is hearing voices telling him that they are going to kill him  Patient reports he is compliant with his medications and took his Risperdal this morning. Patient denies SI, HI, or visual hallucinations. Case was staffed with Arville Care FNP who notes, "Pt was seen and chart reviewed with treatment team and Dr Sharma Covert. Pt denies suicidal/homicidal ideation, denies visual hallucinations and does not appear to be responding to internal stimuli. Pt stated he is hearing voices. He stated he took his Risperdal this morning but the voices did not get better so he thought he should come in to the hospital. He stated he has not been to [facility] in a while and when he did go he lost his prescription. He stated he does have some Risperdal in his coat. He frequently comes in to the emergency room for auditory hallucinations. These are chronic in nature. He has not had labs completed since December 27, 2017. This Clinical research associate spoke with [place], [place] and discussed the case with her. We will draw labs, hold the  patient overnight and give his nighttime and morning Risperdal. Anticipate pt to discharge in the AM if stable.  Diagnosis: F20.9 Schizophrenia   Past Medical History:  Past Medical History:  Diagnosis Date  . Homelessness   . Hypercholesterolemia   . Mental disorder   . Paranoid behavior (HCC)   . PE (pulmonary embolism)   . Schizophrenia (HCC)     No past surgical history on file.  Family History:  Family History  Problem Relation Age of Onset  . Hypertension Mother     Social History:  reports that he has been smoking cigarettes. He has been smoking about 0.50 packs per day. He has never used smokeless tobacco. He reports current alcohol use. He reports current drug use. Drug: Marijuana.  Additional Social History:  Alcohol / Drug Use Pain Medications: see MAR Prescriptions: see MAR Over the Counter: see MAR History of alcohol / drug use?: Yes Longest period of sobriety (when/how long): 2 months Negative Consequences of Use: (Denies) Withdrawal Symptoms: (Denies) Substance #1 Name of Substance 1: THC 1 - Age of First Use: teens 1 - Amount (size/oz): varies 1 - Frequency: sporadic 1 - Duration: several years 1 - Last Use / Amount: 01/17/19 1 gram  CIWA: CIWA-Ar BP: 139/85 Pulse Rate: 95 COWS:    Allergies:  Allergies  Allergen Reactions  . Haldol [Haloperidol] Shortness Of Breath    Home Medications: (Not in a hospital admission)   OB/GYN Status:  No LMP for male patient.  General Assessment Data Location of Assessment: [facility] ED TTS Assessment: In system Is  this a Tele or Face-to-Face Assessment?: Face-to-Face Is this an Initial Assessment or a Re-assessment for this encounter?: Initial Assessment Patient Accompanied by:: (NA) Language Other than English: No Living Arrangements: Other (Comment)(Home) What gender do you identify as?: Male Marital status: Single Pregnancy Status: No Living Arrangements: Alone Can pt return to current living  arrangement?: Yes Admission Status: Voluntary Is patient capable of signing voluntary admission?: Yes Referral Source: Self/Family/Friend Insurance type: Self Pay     Crisis Care Plan Living Arrangements: Alone Legal Guardian: (Self) Name of Psychiatrist: [facility] Name of Therapist: None  Education Status Is patient currently in school?: No Is the patient employed, unemployed or receiving disability?: Employed  Risk to self with the past 6 months Suicidal Ideation: No Has patient been a risk to self within the past 6 months prior to admission? : No Suicidal Intent: No Has patient had any suicidal intent within the past 6 months prior to admission? : No Is patient at risk for suicide?: No, but patient needs Medical Clearance Suicidal Plan?: No Has patient had any suicidal plan within the past 6 months prior to admission? : No Access to Means: No What has been your use of drugs/alcohol within the last 12 months?: NA Previous Attempts/Gestures: No How many times?: 0 Other Self Harm Risks: NA Triggers for Past Attempts: Unknown Intentional Self Injurious Behavior: None Family Suicide History: No Recent stressful life event(s): Other (Comment)(Off medications) Persecutory voices/beliefs?: No Depression: No Depression Symptoms: (Pt denies) Substance abuse history and/or treatment for substance abuse?: No Suicide prevention information given to non-admitted patients: Not applicable  Risk to Others within the past 6 months Homicidal Ideation: No Does patient have any lifetime risk of violence toward others beyond the six months prior to admission? : No Thoughts of Harm to Others: No Current Homicidal Intent: No Current Homicidal Plan: No Access to Homicidal Means: No Identified Victim: None History of harm to others?: No Assessment of Violence: None Noted Violent Behavior Description: None Does patient have access to weapons?: No Criminal Charges Pending?: No Describe  Pending Criminal Charges: NA Does patient have a court date: No Court Date: (NA) Is patient on probation?: No  Psychosis Hallucinations: Auditory Delusions: None noted  Mental Status Report Appearance/Hygiene: Unremarkable Eye Contact: Fair Motor Activity: Unremarkable Speech: Logical/coherent Level of Consciousness: Quiet/awake Mood: Pleasant Affect: Appropriate to circumstance Anxiety Level: Minimal Thought Processes: Coherent, Relevant Judgement: Partial Orientation: Person, Place, Time Obsessive Compulsive Thoughts/Behaviors: None  Cognitive Functioning Concentration: Normal Memory: Recent Intact, Remote Intact Is patient IDD: No Insight: Fair Impulse Control: Poor Appetite: Good Have you had any weight changes? : No Change Sleep: No Change Total Hours of Sleep: 6 Vegetative Symptoms: None  ADLScreening [facility] Assessment [facility]) Patient's cognitive ability adequate to safely complete daily activities?: Yes Patient able to express need for assistance with ADLs?: Yes Independently performs ADLs?: Yes (appropriate for developmental age)  Prior Inpatient Therapy Prior Inpatient Therapy: Yes Prior Therapy Dates: 2019, 2018 [facility] Reason for Treatment: MH issues  Prior Outpatient Therapy Prior Outpatient Therapy: Yes Prior Therapy Dates: Ongoing Prior Therapy Facilty/Provider(s): [facility] Reason for Treatment: Med mang Does patient have an ACCT team?: No Does patient have Intensive In-House Services?  : No Does patient have [facility] services? : No Does patient have P4CC services?: No  ADL Screening (condition at time of admission) Patient's cognitive ability adequate to safely complete daily activities?: Yes Is the patient deaf or have difficulty hearing?: No Does the patient have difficulty seeing, even  when wearing glasses/contacts?: No Does the patient have difficulty concentrating, remembering, or making decisions?:  No Patient able to express need for assistance with ADLs?: Yes Does the patient have difficulty dressing or bathing?: No Independently performs ADLs?: Yes (appropriate for developmental age) Does the patient have difficulty walking or climbing stairs?: No Weakness of Legs: None Weakness of Arms/Hands: None  Home Assistive Devices/Equipment Home Assistive Devices/Equipment: None  Therapy Consults (therapy consults require a physician order) PT Evaluation Needed: No OT Evalulation Needed: No SLP Evaluation Needed: No Abuse/Neglect Assessment (Assessment to be complete while patient is alone) Physical Abuse: Denies Verbal Abuse: Denies Sexual Abuse: Denies Exploitation of patient/patient's resources: Denies Self-Neglect: Denies Values / Beliefs Cultural Requests During Hospitalization: None Spiritual Requests During Hospitalization: None Consults Spiritual Care Consult Needed: No Social Work Consult Needed: No Merchant navy officer (For Healthcare) Does Patient Have a Medical Advance Directive?: No Would patient like information on creating a medical advance directive?: No - Patient declined          Disposition: Parks FNP recommended to hold the patient overnight and give his nighttime and morning Risperdal. Anticipate pt to discharge in the AM if stable. Disposition Initial Assessment Completed for this Encounter: Yes Disposition of Patient: (Observe and monitor) Patient refused recommended treatment: No Mode of transportation if patient is discharged/movement?: (Unk)  On Site Evaluation by:   Reviewed with Physician:    Alfredia Ferguson 01/18/2019 4:52 PM

## 2019-01-18 NOTE — ED Notes (Signed)
Patient made aware that a urine sample was needed.  Urine cup at bedside.

## 2019-01-18 NOTE — ED Notes (Signed)
Bed: [facility]41 Expected date:  Expected time:  Means of arrival:  Comments: Hold for triage 

## 2019-01-18 NOTE — ED Provider Notes (Signed)
[facility]-EMERGENCY DEPT Provider Note   CSN: [id number] Arrival date & time: 01/18/19  1352    History   Chief Complaint No chief complaint on file.   HPI    Cole Barrett is a 33 y.o. male with a PMHx of depression, schizophrenia, HLD, prior PE no longer on anticoagulation, and homelessness, who presents to the ED with complaints of auditory hallucinations hearing voices telling him they are going to kill him.  No known aggravating or alleviating factors.  He takes Risperdal which he took at 6 AM, he states that he takes 4 mg a day.  He denies SI, HI, visual hallucinations.  He endorses marijuana use occasionally, last use was about a couple months ago.  He endorses occasional alcohol use, drinks about 80 ounces of beer 1-2 times a week, last drink 2 beers last night.  He endorses cigarette use.  He denies any other illicit drug use.  He has no medical complaints at this time and is here voluntarily.  Of note, he is no longer on blood thinners.  The history is provided by the patient and medical records. No language interpreter was used.    Past Medical History:  Diagnosis Date  . Homelessness   . Hypercholesterolemia   . Mental disorder   . Paranoid behavior (HCC)   . PE (pulmonary embolism)   . Schizophrenia [facility])     Patient Active Problem List   Diagnosis Date Noted  . 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 Alcohol abuse   . Auditory hallucinations   . Alcohol abuse with alcohol-induced mood disorder (HCC) 09/08/2016  . Homelessness 09/03/2016  . Person feigning illness 09/03/2016  . 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 Cannabis use disorder, mild, abuse 08/29/2016  . Pulmonary emboli (HCC) 07/13/2016  . Adjustment disorder with emotional disturbance 03/12/2014  . 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 Acute left flank pain 01/02/2014  . Pulmonary embolism,  bilateral (HCC) 01/02/2014    No past surgical history on file.      Home Medications    Prior to Admission medications   Medication Sig Start Date End Date Taking? Authorizing Provider  benztropine (COGENTIN) 0.5 MG tablet Take 1 tablet (0.5 mg total) by mouth 2 (two) times daily. 12/27/18   Laveda Abbe, NP  risperiDONE (RISPERDAL) 1 MG tablet Take 1 tablet (1 mg total) by mouth 2 (two) times daily for 10 days. 12/27/18 01/06/19  Laveda Abbe, NP    Family History Family History  Problem Relation Age of Onset  . Hypertension Mother     Social History Social History   Tobacco Use  . Smoking status: Current Every Day Smoker    Packs/day: 0.50    Types: Cigarettes  . Smokeless tobacco: Never Used  Substance Use Topics  . Alcohol use: Yes  . Drug use: Yes    Types: Marijuana    Comment: Last used 12/06/2018     Allergies   Haldol [haloperidol]   Review of Systems Review of Systems  Constitutional: Negative for chills and fever.  Respiratory: Negative for shortness of breath.   Cardiovascular: Negative for chest pain.  Gastrointestinal: Negative for abdominal pain, constipation, diarrhea, nausea and vomiting.  Genitourinary: Negative for dysuria and hematuria.  Musculoskeletal: Negative for arthralgias and myalgias.  Skin: Negative for color change.  Allergic/Immunologic: Negative for immunocompromised state.  Neurological: Negative for weakness and numbness.  Psychiatric/Behavioral: Positive for hallucinations. Negative  for confusion and suicidal ideas.   All other systems reviewed and are negative for acute change except as noted in the HPI.    Physical Exam Updated Vital Signs BP 140/83   Pulse (!) 102   Temp 98 F (36.7 C) (Oral)   Resp 18   SpO2 100%   Physical Exam Vitals signs and nursing note reviewed.  Constitutional:      General: He is not in acute distress.    Appearance: Normal appearance. He is well-developed. He is not  toxic-appearing.     Comments: Afebrile, nontoxic, NAD  HENT:     Head: Normocephalic and atraumatic.  Eyes:     General:        Right eye: No discharge.        Left eye: No discharge.     Conjunctiva/sclera: Conjunctivae normal.  Neck:     Musculoskeletal: Normal range of motion and neck supple.  Cardiovascular:     Rate and Rhythm: Normal rate and regular rhythm.     Pulses: Normal pulses.     Heart sounds: Normal heart sounds, S1 normal and S2 normal. No murmur. No friction rub. No gallop.      Comments: No tachycardia on exam Pulmonary:     Effort: Pulmonary effort is normal. No respiratory distress.     Breath sounds: Normal breath sounds. No decreased breath sounds, wheezing, rhonchi or rales.  Abdominal:     General: Bowel sounds are normal. There is no distension.     Palpations: Abdomen is soft. Abdomen is not rigid.     Tenderness: There is no abdominal tenderness. There is no right CVA tenderness, left CVA tenderness, guarding or rebound. Negative signs include Murphy's sign and McBurney's sign.  Musculoskeletal: Normal range of motion.  Skin:    General: Skin is warm and dry.     Findings: No rash.  Neurological:     Mental Status: He is alert and oriented to person, place, and time.     Sensory: Sensation is intact. No sensory deficit.     Motor: Motor function is intact.  Psychiatric:        Attention and Perception: He perceives auditory hallucinations. He does not perceive visual hallucinations.        Mood and Affect: Mood and affect normal.        Behavior: Behavior normal.        Thought Content: Thought content does not include homicidal or suicidal ideation. Thought content does not include homicidal or suicidal plan.     Comments: Normal mood and affect, but pleasant and cooperative. Denies SI or HI, reports auditory without visual hallucinations, doesn't seem to be responding to internal stimuli.       ED Treatments / Results  Labs (all labs ordered  are listed, but only abnormal results are displayed) Labs Reviewed  CBC WITH DIFFERENTIAL/PLATELET - Abnormal; Notable for the following components:      Result Value   WBC 3.7 (*)    RBC 4.14 (*)    Hemoglobin 11.5 (*)    HCT 36.7 (*)    Neutro Abs 1.6 (*)    All other components within normal limits  COMPREHENSIVE METABOLIC PANEL - Abnormal; Notable for the following components:   Calcium 8.6 (*)    All other components within normal limits  ACETAMINOPHEN LEVEL - Abnormal; Notable for the following components:   Acetaminophen (Tylenol), Serum <10 (*)    All other components within normal limits  ETHANOL  SALICYLATE LEVEL  RAPID URINE DRUG SCREEN, HOSP PERFORMED    EKG None  Radiology No results found.  Procedures Procedures (including critical care time)  Medications Ordered in ED Medications  risperiDONE (RISPERDAL) tablet 1 mg (1 mg Oral Given 01/18/19 1522)    And  risperiDONE (RISPERDAL) tablet 4 mg (has no administration in time range)  benztropine (COGENTIN) tablet 0.5 mg (0.5 mg Oral Given 01/18/19 1522)  acetaminophen (TYLENOL) tablet 650 mg (has no administration in time range)  zolpidem (AMBIEN) tablet 5 mg (has no administration in time range)  ondansetron (ZOFRAN) tablet 4 mg (has no administration in time range)  alum & mag hydroxide-simeth (MAALOX/MYLANTA) 200-200-20 MG/5ML suspension 30 mL (has no administration in time range)  nicotine (NICODERM CQ - dosed in mg/24 hours) patch 21 mg (has no administration in time range)     Initial Impression / Assessment and Plan / ED Course  I have reviewed the triage vital signs and the nursing notes.  Pertinent labs & imaging results that were available during my care of the patient were reviewed by me and considered in my medical decision making (see chart for details).        33 y.o. male here with auditory hallucinations telling him that they are going to kill him.  He denies SI or HI, denies visual  hallucinations.  Uses marijuana occasionally but denies other illicit drug use.  Endorses alcohol use, some last night, also smokes, cessation of both was advised.  Reports compliance with his Risperdal.  He has no other medical complaints at this time and is here voluntarily.  Physical exam is benign, does not appear to be responding to internal stimuli.  Will get psych clearance labs and TTS consult.  Will reassess shortly.  4:16 PM CBC w/diff with chronic stable anemia. CMP WNL. EtOH level undetectable. Salicylate and acetaminophen levels WNL. UDS pending, but does not interfere with med clearance. Pt medically cleared at this time. Psych hold orders and home med orders placed. Please see TTS notes for further documentation of care/dispo, they called and advised me that they recommended overnight obs. PLEASE NOTE THAT PT IS HERE VOLUNTARILY AT THIS TIME, IF PT TRIES TO LEAVE THEY WOULD NEED REASSESSMENT TO SEE IF IVC PAPERWORK NEEDED TO BE TAKEN OUT. Pt stable at time of med clearance.     Final Clinical Impressions(s) / ED Diagnoses   Final diagnoses:  Auditory hallucinations  Medical clearance for psychiatric admission  Alcohol use  Tobacco user    ED Discharge Orders    [address]       [name], [place] Lake[place], [place]-C 01/18/19 1616    Samuel JesterMcManus, [place], [place]DO 01/23/19 1505

## 2019-01-18 NOTE — ED Triage Notes (Signed)
Patient who is homeless reports he is hearing voices telling him that they are going to kill him  Patient reports he is compliant with his medications and took a risperdal this morning.  He does not have any appointments with a mental health provider.  Patient denies SI, HI, or visual hallucinations.

## 2019-01-18 NOTE — ED Notes (Signed)
Bed: [facility] Expected date:  Expected time:  Means of arrival:  Comments: 

## 2019-01-18 NOTE — ED Notes (Signed)
Pt alert and cooperative. Pt denies any pain. Pt denies any si, hi. Pt c/o of auditory hallucinations. Pt states " I'm hearing voices that's saying they are going to kill me" pt contract to safety. Pt safe will continue to monitor.

## 2019-01-19 DIAGNOSIS — F250 Schizoaffective disorder, bipolar type: Secondary | ICD-10-CM | POA: Diagnosis present

## 2019-01-19 MED ORDER — RISPERIDONE 1 MG PO TABS: 1.0000 mg | ORAL_TABLET | ORAL | 0 refills | Status: DC

## 2019-01-19 NOTE — Consult Note (Addendum)
[facility] Psych ED Discharge  01/19/2019 10:08 AM Cole Barrett  MRN:  [id number] Principal Problem: Schizoaffective disorder, bipolar type [facility]) Discharge Diagnoses: Principal Problem:   Schizoaffective disorder, bipolar type (HCC)  Subjective:33 yo male who presented to the ED with auditory hallucinations telling him they are going to kill him.  He reports running out of medicine, goes to [place].  Hallucinations increase with cocaine use or substance abuse and decrease with antipsychotics.  On assessment, he reports minimal hallucinations, is not responding to internal stimuli.  Cole Barrett is well known to these providers and ED for similar presentations, never appears to be responding to internal stimuli.  His biggest issue is being homeless but claims he does have a place to live currently.  No homicidal ideations or suicidal ideations or substance abuse (recently).  Stable for discharge.  Total Time spent with patient: 45 minutes  Past Psychiatric History: hallucinations, depression, substance abuse  Past Medical History:  Past Medical History:  Diagnosis Date  . Homelessness   . Hypercholesterolemia   . Mental disorder   . Paranoid behavior (HCC)   . PE (pulmonary embolism)   . Schizophrenia (HCC)    No past surgical history on file. Family History:  Family History  Problem Relation Age of Onset  . Hypertension Mother    Family Psychiatric  History: none Social History:  Social History   Substance and Sexual Activity  Alcohol Use Yes     Social History   Substance and Sexual Activity  Drug Use Yes  . Types: Marijuana   Comment: Last used 12/06/2018    Social History   Socioeconomic History  . Marital status: Single    Spouse name: Not on file  . Number of children: Not on file  . Years of education: Not on file  . Highest education level: Not on file  Occupational History  . Not on file  Social Needs  . Financial resource strain: Not on file  . Food  insecurity:    Worry: Not on file    Inability: Not on file  . Transportation needs:    Medical: Not on file    Non-medical: Not on file  Tobacco Use  . Smoking status: Current Every Day Smoker    Packs/day: 0.50    Types: Cigarettes  . Smokeless tobacco: Never Used  Substance and Sexual Activity  . Alcohol use: Yes  . Drug use: Yes    Types: Marijuana    Comment: Last used 12/06/2018  . Sexual activity: Never  Lifestyle  . Physical activity:    Days per week: Not on file    Minutes per session: Not on file  . Stress: Not on file  Relationships  . Social connections:    Talks on phone: Not on file    Gets together: Not on file    Attends religious service: Not on file    Active member of club or organization: Not on file    Attends meetings of clubs or organizations: Not on file    Relationship status: Not on file  Other Topics Concern  . Not on file  Social History Narrative   ** Merged History Encounter **        Has this patient used any form of tobacco in the last 30 days? (Cigarettes, Smokeless Tobacco, Cigars, and/or Pipes) A prescription for an FDA-approved tobacco cessation medication was offered at discharge and the patient refused  Current Medications: Current Facility-Administered Medications  Medication Dose Route Frequency Provider  Last Rate Last Dose  . acetaminophen (TYLENOL) tablet 650 mg  650 mg Oral Q4H PRN Street, [place], [place]      . alum & mag hydroxide-simeth (MAALOX/MYLANTA) 200-200-20 MG/5ML suspension 30 mL  30 mL Oral Q6H PRN Street, [place], [place]      . benztropine (COGENTIN) tablet 0.5 mg  0.5 mg Oral BID Laveda Abbe, NP   0.5 mg at 01/18/19 2105  . nicotine (NICODERM CQ - dosed in mg/24 hours) patch 21 mg  21 mg Transdermal Daily Street, [place], PA-C      . ondansetron [facility]) tablet 4 mg  4 mg Oral Q8H PRN Street, [place], [place]      . risperiDONE (RISPERDAL) tablet 1 mg  1 mg Oral q morning - 10a Laveda Abbe, NP   1  mg at 01/18/19 1522   And  . risperiDONE (RISPERDAL) tablet 4 mg  4 mg Oral QHS Laveda Abbe, NP   4 mg at 01/18/19 2105   Current Outpatient Medications  Medication Sig Dispense Refill  . risperiDONE (RISPERDAL) 1 MG tablet Take 1-4 tablets (1-4 mg total) by mouth See admin instructions for 10 days. Take 1 mg every morning and 4 mg at bedtime. 150 tablet 0   PTA Medications: (Not in a hospital admission)   Musculoskeletal: Strength & Muscle Tone: within normal limits Gait & Station: normal Patient leans: Right  Psychiatric Specialty Exam: Physical Exam  Nursing note and vitals reviewed. Constitutional: He is oriented to person, place, and time. He appears well-developed and well-nourished.  HENT:  Head: Normocephalic.  Neck: Normal range of motion.  Respiratory: Effort normal.  Musculoskeletal: Normal range of motion.  Neurological: He is alert and oriented to person, place, and time.  Psychiatric: His speech is normal. Judgment and thought content normal. His mood appears anxious. He is actively hallucinating. Cognition and memory are normal.    Review of Systems  Psychiatric/Behavioral: Positive for hallucinations. The patient is nervous/anxious.   All other systems reviewed and are negative.   Blood pressure 136/86, pulse 76, temperature 98.3 F (36.8 C), temperature source Oral, resp. rate 18, SpO2 100 %.There is no height or weight on file to calculate BMI.  General Appearance: Casual  Eye Contact:  Good  Speech:  Normal Rate  Volume:  Normal  Mood:  Anxious  Affect:  Congruent  Thought Process:  Coherent and Descriptions of Associations: Intact  Orientation:  Full (Time, Place, and Person)  Thought Content:  Hallucinations: Auditory but not responding  Suicidal Thoughts:  No  Homicidal Thoughts:  No  Memory:  Immediate;   Fair Recent;   Fair Remote;   Fair  Judgement:  Fair  Insight:  Fair  Psychomotor Activity:  Normal  Concentration:   Concentration: Good and Attention Span: Good  Recall:  Good  Fund of Knowledge:  Fair  Language:  Good  Akathisia:  No  Handed:  Right  AIMS (if indicated):     Assets:  Housing Leisure Time Physical Health Resilience Social Support  ADL's:  Intact  Cognition:  WNL  Sleep:        Demographic Factors:  Male  Loss Factors: NA  Historical Factors: NA  Risk Reduction Factors:   Sense of responsibility to family, Living with another person, especially a relative, Positive social support and Positive therapeutic relationship  Continued Clinical Symptoms:  Hallucinations, auditory (mild)  Cognitive Features That Contribute To Risk:  None    Suicide Risk:  Minimal: No identifiable suicidal  ideation.  Patients presenting with no risk factors but with morbid ruminations; may be classified as minimal risk based on the severity of the depressive symptoms  Follow-up Information    [facility] Follow up.   Why:  Please follow up with out patient provider  Contact information: [address] [place] [address] [phone]           Plan Of Care/Follow-up recommendations:  Schizoaffective disorder, bipolar type: -Restarted his Risperal 1 mg in the am and 4 mg in the pm -Follow up with [facility] Activity:  as tolerated Diet:  heart healthy diet  Disposition: discharge home Nanine Means, NP 01/19/2019, 10:08 AM  Patient seen face-to-face for psychiatric evaluation, chart reviewed and case discussed with the physician extender and developed treatment plan. Reviewed the information documented and agree with the treatment plan. Thedore Mins, MD

## 2019-01-19 NOTE — ED Notes (Signed)
Pt discharged safely after reviewing discharge instructions and RX.  All belongings were returned to pt. 

## 2019-02-12 ENCOUNTER — Emergency Department (HOSPITAL_COMMUNITY)
Admission: EM | Admit: 2019-02-12 | Discharge: 2019-02-12 | Disposition: A | Discharge status: Home / Self Care | Payer: Self-pay | Attending: Emergency Medicine | Admitting: Emergency Medicine

## 2019-02-12 ENCOUNTER — Other Ambulatory Visit: Payer: Self-pay

## 2019-02-12 DIAGNOSIS — E7800 Pure hypercholesterolemia, unspecified: Secondary | ICD-10-CM | POA: Insufficient documentation

## 2019-02-12 DIAGNOSIS — F17210 Nicotine dependence, cigarettes, uncomplicated: Secondary | ICD-10-CM | POA: Insufficient documentation

## 2019-02-12 DIAGNOSIS — Z86718 Personal history of other venous thrombosis and embolism: Secondary | ICD-10-CM | POA: Insufficient documentation

## 2019-02-12 DIAGNOSIS — R440 Auditory hallucinations: Secondary | ICD-10-CM | POA: Insufficient documentation

## 2019-02-12 DIAGNOSIS — F1210 Cannabis abuse, uncomplicated: Secondary | ICD-10-CM | POA: Insufficient documentation

## 2019-02-12 NOTE — ED Triage Notes (Signed)
Pt c/o hearing voices. Pt denies SI and HI. Pt states that he has been smoking marijuana recently.

## 2019-02-12 NOTE — ED Provider Notes (Signed)
[facility] EMERGENCY DEPARTMENT Provider Note  CSN: [id number] Arrival date & time: 02/12/19 5176  Chief Complaint(s) Hallucinations  HPI Cole Barrett is a 33 y.o. male with a history of schizophrenia on Risperdal who presents to the emergency department with auditory hallucinations.  Patient reports that he has been well controlled with the Risperdal and has been compliant with his medications.  However he reports smoking marijuana last night.  States that when he awoke to get something to eat earlier this morning he heard voices and they were getting louder.  They are not telling him to hurt himself or anybody else.  He is currently denying any SI or HI.  HPI  Past Medical History Past Medical History:  Diagnosis Date  . Homelessness   . Hypercholesterolemia   . Mental disorder   . Paranoid behavior (HCC)   . PE (pulmonary embolism)   . Schizophrenia [facility])    Patient Active Problem List   Diagnosis Date Noted  . Schizoaffective disorder, bipolar type (HCC) 01/19/2019  . Alcohol abuse   . Alcohol abuse with alcohol-induced mood disorder (HCC) 09/08/2016  . Homelessness 09/03/2016  . Person feigning illness 09/03/2016  . 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 Cannabis use disorder, mild, abuse 08/29/2016  . Pulmonary emboli (HCC) 07/13/2016  . Adjustment disorder with emotional disturbance 03/12/2014  . 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 Acute left flank pain 01/02/2014  . Pulmonary embolism, bilateral (HCC) 01/02/2014   Home Medication(s) Prior to Admission medications   Medication Sig Start Date End Date Taking? Authorizing Provider  risperiDONE (RISPERDAL) 1 MG tablet Take 1-4 tablets (1-4 mg total) by mouth See admin instructions for 10 days. Take 1 mg every morning and 4 mg at bedtime. 01/19/19 01/29/19  Charm Rings, NP                                      Past Surgical History No past surgical history on file. Family History Family History  Problem Relation Age of Onset  . Hypertension Mother     Social History Social History   Tobacco Use  . Smoking status: Current Every Day Smoker    Packs/day: 0.50    Types: Cigarettes  . Smokeless tobacco: Never Used  Substance Use Topics  . Alcohol use: Yes  . Drug use: Yes    Types: Marijuana    Comment: Last used 12/06/2018   Allergies Haldol [haloperidol]  Review of Systems Review of Systems All other systems are reviewed and are negative for acute change except as noted in the HPI  Physical Exam Vital Signs  I have reviewed the triage vital signs BP 119/76 (BP Location: Right Arm)   Pulse 98   Temp 98.3 F (36.8 C) (Oral)   Resp 18   Ht 5\' 10"  (1.778 m)   Wt 108.9 kg   SpO2 97%   BMI 34.44 kg/m   Physical Exam Vitals signs reviewed.  Constitutional:      General: He is not in acute distress.    Appearance: He is well-developed. He is not diaphoretic.  HENT:     Head: Normocephalic and atraumatic.     Jaw: No trismus.     Right Ear: External ear normal.     Left Ear: External ear normal.     Nose: Nose normal.  Eyes:     General: No scleral  icterus.    Conjunctiva/sclera: Conjunctivae normal.  Neck:     Musculoskeletal: Normal range of motion.     Trachea: Phonation normal.  Cardiovascular:     Rate and Rhythm: Normal rate and regular rhythm.  Pulmonary:     Effort: Pulmonary effort is normal. No respiratory distress.     Breath sounds: No stridor.  Abdominal:     General: There is no distension.  Musculoskeletal: Normal range of motion.  Neurological:     Mental Status: He is alert and oriented to person, place, and time.  Psychiatric:        Attention and Perception: He is attentive. He does not perceive auditory or visual hallucinations.        Mood and Affect: Mood normal.        Speech: Speech normal.         Behavior: Behavior normal.        Thought Content: Thought content is not paranoid or delusional. Thought content does not include homicidal or suicidal ideation.     ED Results and Treatments Labs (all labs ordered are listed, but only abnormal results are displayed) Labs Reviewed - No data to display                                                                                                                       EKG  EKG Interpretation  Date/Time:    Ventricular Rate:    PR Interval:    QRS Duration:   QT Interval:    QTC Calculation:   R Axis:     Text Interpretation:        Radiology No results found. Pertinent labs & imaging results that were available during my care of the patient were reviewed by me and considered in my medical decision making (see chart for details).  Medications Ordered in ED Medications - No data to display                                                                                                                                  Procedures Procedures  (including critical care time)  Medical Decision Making / ED Course I have reviewed the nursing notes for this encounter and the patient's prior records (if available in EHR or on provided paperwork).    Noncommand auditory hallucinations in the setting of marijuana use.  This is likely a trigger.  Patient is not  a threat to himself or others at this time.  Feel he is appropriate for outpatient management.  Instructed to follow-up with [facility] later on this morning if the voices do not improve.  The patient appears reasonably screened and/or stabilized for discharge and I doubt any other medical condition or other [facility] requiring further screening, evaluation, or treatment in the ED at this time prior to discharge.  The patient is safe for discharge with strict return precautions.   Final Clinical Impression(s) / ED Diagnoses Final diagnoses:  Auditory hallucination  Cannabis abuse,  episodic   Disposition: Discharge  Condition: Good  I have discussed the results, Dx and Tx plan with the patient who expressed understanding and agree(s) with the plan. Discharge instructions discussed at great length. The patient was given strict return precautions who verbalized understanding of the instructions. No further questions at time of discharge.    ED Discharge Orders    None       Follow Up: [facility] [address] [place] [address] [phone]  Go to  If symptoms do not improve or  worsen      This chart was dictated using voice recognition software.  Despite best efforts to proofread,  errors can occur which can change the documentation meaning.   [name], MD 02/12/19 [phone]

## 2019-02-18 ENCOUNTER — Encounter (HOSPITAL_COMMUNITY): Payer: Self-pay | Admitting: Emergency Medicine

## 2019-02-18 ENCOUNTER — Other Ambulatory Visit: Payer: Self-pay

## 2019-02-18 ENCOUNTER — Emergency Department (HOSPITAL_COMMUNITY)
Admission: EM | Admit: 2019-02-18 | Discharge: 2019-02-18 | Disposition: A | Discharge status: Home / Self Care | Payer: Self-pay | Attending: Emergency Medicine | Admitting: Emergency Medicine

## 2019-02-18 DIAGNOSIS — F17210 Nicotine dependence, cigarettes, uncomplicated: Secondary | ICD-10-CM | POA: Insufficient documentation

## 2019-02-18 DIAGNOSIS — Z760 Encounter for issue of repeat prescription: Secondary | ICD-10-CM | POA: Insufficient documentation

## 2019-02-18 DIAGNOSIS — F209 Schizophrenia, unspecified: Secondary | ICD-10-CM | POA: Insufficient documentation

## 2019-02-18 MED ORDER — RISPERIDONE 2 MG PO TABS
2.0000 mg | ORAL_TABLET | Freq: Once | ORAL | Status: AC
  Administered 2019-02-18: 2 mg via ORAL
  Filled 2019-02-18: qty 1

## 2019-02-18 MED ORDER — RISPERIDONE 1 MG PO TABS: 1.0000 mg | ORAL_TABLET | ORAL | 0 refills | Status: DC

## 2019-02-18 NOTE — Discharge Instructions (Addendum)
Please contact behavioral health hospital for an appointment so that he can get your refills on time.

## 2019-02-18 NOTE — ED Notes (Signed)
Bed: [facility] Expected date:  Expected time:  Means of arrival:  Comments: 

## 2019-02-18 NOTE — ED Triage Notes (Signed)
Patient arrived by self from home. Patient states he needs a medication refill for Risperdal.   Pt has no other complaints.

## 2019-02-18 NOTE — ED Notes (Signed)
Patient given discharge teaching and verbalized understanding. Patient ambulated out of ED with a steady gait. 

## 2019-02-18 NOTE — ED Provider Notes (Signed)
[facility]-EMERGENCY DEPT Provider Note   CSN: [id number] Arrival date & time: 02/18/19  0606    History   Chief Complaint Chief Complaint  Patient presents with  . Medication Refill    HPI Cole Barrett is a 33 y.o. male.     HPI  33 year old male with history of schizophrenia, PE comes in with chief complaint of medication refill.  Patient reports that he ran out of his Risperdal last night.  He is requesting that he get his morning dose right now and medications to help him get through the next few days before he gets to see a doctor at [facility].  Patient has no HI, SI.  He has no other complaints at this time.  Past Medical History:  Diagnosis Date  . Homelessness   . Hypercholesterolemia   . Mental disorder   . Paranoid behavior (HCC)   . PE (pulmonary embolism)   . Schizophrenia [facility])     Patient Active Problem List   Diagnosis Date Noted  . Schizoaffective disorder, bipolar type (HCC) 01/19/2019  . Alcohol abuse   . Alcohol abuse with alcohol-induced mood disorder (HCC) 09/08/2016  . Homelessness 09/03/2016  . Person feigning illness 09/03/2016  . 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 Cannabis use disorder, mild, abuse 08/29/2016  . Pulmonary emboli (HCC) 07/13/2016  . Adjustment disorder with emotional disturbance 03/12/2014  . 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 Acute left flank pain 01/02/2014  . Pulmonary embolism, bilateral (HCC) 01/02/2014    History reviewed. No pertinent surgical history.      Home Medications    Prior to Admission medications   Medication Sig Start Date End Date Taking? Authorizing Provider  risperiDONE (RISPERDAL) 1 MG tablet Take 1-4 tablets (1-4 mg total) by mouth See admin instructions for 10 days. Take 1 mg every morning and 4 mg at bedtime. 02/18/19 02/28/19  Derwood Kaplan, MD    Family History Family History  Problem Relation Age of Onset  . Hypertension  Mother     Social History Social History   Tobacco Use  . Smoking status: Current Every Day Smoker    Packs/day: 0.50    Types: Cigarettes  . Smokeless tobacco: Never Used  Substance Use Topics  . Alcohol use: Yes  . Drug use: Yes    Types: Marijuana    Comment: Last used 12/06/2018     Allergies   Haldol [haloperidol]   Review of Systems Review of Systems  Constitutional: Negative for activity change.  Psychiatric/Behavioral: Negative for self-injury and suicidal ideas.     Physical Exam Updated Vital Signs BP 119/66 (BP Location: Left Arm)   Pulse 92   Temp 97.6 F (36.4 C) (Oral)   Resp 14   SpO2 99%   Physical Exam Vitals signs and nursing note reviewed.  Constitutional:      Appearance: He is well-developed.  HENT:     Head: Atraumatic.  Neck:     Musculoskeletal: Neck supple.  Cardiovascular:     Rate and Rhythm: Normal rate.  Pulmonary:     Effort: Pulmonary effort is normal.  Skin:    General: Skin is warm.  Neurological:     Mental Status: He is alert and oriented to person, place, and time.  Psychiatric:        Mood and Affect: Mood normal.        Behavior: Behavior normal.      ED Treatments / Results  Labs (all  labs ordered are listed, but only abnormal results are displayed) Labs Reviewed - No data to display  EKG None  Radiology No results found.  Procedures Procedures (including critical care time)  Medications Ordered in ED Medications  risperiDONE (RISPERDAL) tablet 2 mg (has no administration in time range)     Initial Impression / Assessment and Plan / ED Course  I have reviewed the triage vital signs and the nursing notes.  Pertinent labs & imaging results that were available during my care of the patient were reviewed by me and considered in my medical decision making (see chart for details).        33 year old comes in a chief complaint of medication refill. He has no HI, SI.  Patient appears to be stable  at this time.  We will discharge him with 10 days worth of medication.  He is going to follow-up with [facility] for further management.  Final Clinical Impressions(s) / ED Diagnoses   Final diagnoses:  Medication refill    ED Discharge Orders         Ordered    risperiDONE (RISPERDAL) 1 MG tablet  See admin instructions     02/18/19 7893           Derwood Kaplan, MD 02/18/19 [phone]

## 2019-02-21 ENCOUNTER — Emergency Department (HOSPITAL_COMMUNITY)
Admission: EM | Admit: 2019-02-21 | Discharge: 2019-02-21 | Disposition: A | Discharge status: Home / Self Care | Payer: Self-pay | Attending: Emergency Medicine | Admitting: Emergency Medicine

## 2019-02-21 ENCOUNTER — Encounter (HOSPITAL_COMMUNITY): Payer: Self-pay

## 2019-02-21 DIAGNOSIS — R440 Auditory hallucinations: Secondary | ICD-10-CM | POA: Insufficient documentation

## 2019-02-21 DIAGNOSIS — F17210 Nicotine dependence, cigarettes, uncomplicated: Secondary | ICD-10-CM | POA: Insufficient documentation

## 2019-02-21 DIAGNOSIS — F1210 Cannabis abuse, uncomplicated: Secondary | ICD-10-CM | POA: Insufficient documentation

## 2019-02-21 NOTE — Discharge Instructions (Addendum)
Continue taking your medications for chronic auditory hallucinations. Stop smoking marijuana as this seems to worsen your symptoms. Follow up with [facility] tomorrow for further evaluation and recheck.

## 2019-02-21 NOTE — ED Provider Notes (Signed)
[facility] EMERGENCY DEPARTMENT Provider Note   CSN: [id number] Arrival date & time: 02/21/19  0025    History   Chief Complaint Chief Complaint  Patient presents with   Hallucinations    HPI Cole Barrett is a 33 y.o. male.     Patient well know to the ED with 31 visits in the last 6 months, presents with complaint of AH. He states he hears voices stating they are going to kill him. He denies any commands for self-harm or hurting others. No visual hallucinations. He states he has been compliant with his Risperdal. He admits to smoking marijuana earlier today. He states the voices he is hearing are consistent with his chronic auditory hallucinations only louder.  The history is provided by the patient. No language interpreter was used.    Past Medical History:  Diagnosis Date   Homelessness    Hypercholesterolemia    Mental disorder    Paranoid behavior (HCC)    PE (pulmonary embolism)    Schizophrenia (HCC)     Patient Active Problem List   Diagnosis Date Noted   Schizoaffective disorder, bipolar type (HCC) 01/19/2019   Alcohol abuse    Alcohol abuse with alcohol-induced mood disorder (HCC) 09/08/2016   Homelessness 09/03/2016   Person feigning illness 09/03/2016   Brief psychotic disorder (HCC) 08/29/2016   Cannabis use disorder, mild, abuse 08/29/2016   Pulmonary emboli (HCC) 07/13/2016   Adjustment disorder with emotional disturbance 03/12/2014   Pulmonary embolism and infarction (HCC) 01/02/2014   Acute left flank pain 01/02/2014   Pulmonary embolism, bilateral (HCC) 01/02/2014    History reviewed. No pertinent surgical history.      Home Medications    Prior to Admission medications   Medication Sig Start Date End Date Taking? Authorizing Provider  risperiDONE (RISPERDAL) 1 MG tablet Take 1-4 tablets (1-4 mg total) by mouth See admin instructions for 10 days. Take 1 mg every morning and 4 mg at bedtime.  02/18/19 02/28/19  Derwood Kaplan, MD    Family History Family History  Problem Relation Age of Onset   Hypertension Mother     Social History Social History   Tobacco Use   Smoking status: Current Every Day Smoker    Packs/day: 0.50    Types: Cigarettes   Smokeless tobacco: Never Used  Substance Use Topics   Alcohol use: Yes   Drug use: Yes    Types: Marijuana    Comment: Last used 12/06/2018     Allergies   Haldol [haloperidol]   Review of Systems Review of Systems  Constitutional: Negative for chills and fever.  HENT: Negative.   Respiratory: Negative.   Cardiovascular: Negative.   Gastrointestinal: Negative.   Musculoskeletal: Negative.   Skin: Negative.   Neurological: Negative.   Psychiatric/Behavioral: Positive for hallucinations. Negative for self-injury and suicidal ideas.     Physical Exam Updated Vital Signs BP 140/84 (BP Location: Right Arm)    Pulse 92    Temp (!) 97.3 F (36.3 C) (Oral)    Resp 16    SpO2 98%   Physical Exam Vitals signs and nursing note reviewed.  Constitutional:      General: He is not in acute distress.    Appearance: He is well-developed.     Comments: Strong odor of marijuana on the patient.  HENT:     Head: Normocephalic.  Neck:     Musculoskeletal: Normal range of motion and neck supple.  Cardiovascular:     Rate  and Rhythm: Normal rate and regular rhythm.  Pulmonary:     Effort: Pulmonary effort is normal.     Breath sounds: Normal breath sounds.  Abdominal:     General: Bowel sounds are normal.     Palpations: Abdomen is soft.     Tenderness: There is no abdominal tenderness. There is no guarding or rebound.  Musculoskeletal: Normal range of motion.  Skin:    General: Skin is warm and dry.     Findings: No rash.  Neurological:     Mental Status: He is alert and oriented to person, place, and time.  Psychiatric:        Attention and Perception: He perceives auditory hallucinations.        Mood and  Affect: Mood normal.        Speech: Speech normal.        Behavior: Behavior is cooperative.        Thought Content: Thought content does not include homicidal or suicidal ideation.      ED Treatments / Results  Labs (all labs ordered are listed, but only abnormal results are displayed) Labs Reviewed - No data to display  EKG None  Radiology No results found.  Procedures Procedures (including critical care time)  Medications Ordered in ED Medications - No data to display   Initial Impression / Assessment and Plan / ED Course  I have reviewed the triage vital signs and the nursing notes.  Pertinent labs & imaging results that were available during my care of the patient were reviewed by me and considered in my medical decision making (see chart for details).        Patient to ED with auditory hallucinations described as voices that are saying they are going to kill him. No SI/HI/VH. Chart reviewed. Complaint tonight is consistent with multiple presentations. The voices are noncommand voices. He admits to smoking marijuana and chronic AH have been documented to be worse when using substances.   He does not appear to be responding to external stimuli. He appears very comfortable and in NAD. VSS. He states compliance with his medications. He follows with [facility] in the outpatient setting. He states he is not currently homeless and has housing.   He is felt appropriate for discharge with recommendation to follow up with [facility] tomorrow. He does not appear to be a threat to himself or others currently with chronic symptoms present.   Final Clinical Impressions(s) / ED Diagnoses   Final diagnoses:  None   1. Chronic auditory hallucinations ED Discharge Orders    None       Elpidio Anis, Cordelia Poche 02/21/19 0146    [name], MD 02/21/19 [phone]

## 2019-02-21 NOTE — ED Triage Notes (Signed)
Pt here for hearing voices, denies SI/HI, states he wants to go to [facility]

## 2019-02-27 ENCOUNTER — Other Ambulatory Visit: Payer: Self-pay

## 2019-02-27 ENCOUNTER — Emergency Department (HOSPITAL_COMMUNITY)
Admission: EM | Admit: 2019-02-27 | Discharge: 2019-02-27 | Disposition: A | Discharge status: Home / Self Care | Payer: Self-pay | Attending: Emergency Medicine | Admitting: Emergency Medicine

## 2019-02-27 ENCOUNTER — Encounter (HOSPITAL_COMMUNITY): Payer: Self-pay | Admitting: Emergency Medicine

## 2019-02-27 DIAGNOSIS — F17210 Nicotine dependence, cigarettes, uncomplicated: Secondary | ICD-10-CM | POA: Insufficient documentation

## 2019-02-27 DIAGNOSIS — R443 Hallucinations, unspecified: Secondary | ICD-10-CM

## 2019-02-27 DIAGNOSIS — Z79899 Other long term (current) drug therapy: Secondary | ICD-10-CM | POA: Insufficient documentation

## 2019-02-27 DIAGNOSIS — F209 Schizophrenia, unspecified: Secondary | ICD-10-CM | POA: Insufficient documentation

## 2019-02-27 LAB — COMPREHENSIVE METABOLIC PANEL
ALT: 47 U/L — ABNORMAL HIGH (ref 0–44)
AST: 28 U/L (ref 15–41)
Albumin: 3.9 g/dL (ref 3.5–5.0)
Alkaline Phosphatase: 41 U/L (ref 38–126)
Anion gap: 12 (ref 5–15)
BUN: 12 mg/dL (ref 6–20)
CO2: 20 mmol/L — ABNORMAL LOW (ref 22–32)
Calcium: 9.1 mg/dL (ref 8.9–10.3)
Chloride: 105 mmol/L (ref 98–111)
Creatinine, Ser: 1 mg/dL (ref 0.61–1.24)
GFR calc Af Amer: >60 mL/min (ref >60)
GFR calc non Af Amer: >60 mL/min (ref >60)
Glucose, Bld: 105 mg/dL — ABNORMAL HIGH (ref 70–99)
Potassium: 4 mmol/L (ref 3.5–5.1)
Sodium: 137 mmol/L (ref 135–145)
Total Bilirubin: 0.4 mg/dL (ref 0.3–1.2)
Total Protein: 7.2 g/dL (ref 6.5–8.1)

## 2019-02-27 LAB — CBC WITH DIFFERENTIAL/PLATELET
Abs Immature Granulocytes: 0.08 10*3/uL — ABNORMAL HIGH (ref 0.00–0.07)
Basophils Absolute: 0 10*3/uL (ref 0.0–0.1)
Basophils Relative: 1 %
Eosinophils Absolute: 0.2 10*3/uL (ref 0.0–0.5)
Eosinophils Relative: 4 %
HCT: 41.1 % (ref 39.0–52.0)
Hemoglobin: 12.8 g/dL — ABNORMAL LOW (ref 13.0–17.0)
Immature Granulocytes: 2 %
Lymphocytes Relative: 30 %
Lymphs Abs: 1.5 10*3/uL (ref 0.7–4.0)
MCH: 27.1 pg (ref 26.0–34.0)
MCHC: 31.1 g/dL (ref 30.0–36.0)
MCV: 87.1 fL (ref 80.0–100.0)
Monocytes Absolute: 0.5 10*3/uL (ref 0.1–1.0)
Monocytes Relative: 11 %
Neutro Abs: 2.6 10*3/uL (ref 1.7–7.7)
Neutrophils Relative %: 52 %
Platelets: 301 10*3/uL (ref 150–400)
RBC: 4.72 MIL/uL (ref 4.22–5.81)
RDW: 12.9 % (ref 11.5–15.5)
WBC: 4.9 10*3/uL (ref 4.0–10.5)
nRBC: 0 % (ref 0.0–0.2)

## 2019-02-27 LAB — ETHANOL: Alcohol, Ethyl (B): 84 mg/dL — ABNORMAL HIGH (ref <10)

## 2019-02-27 MED ORDER — ALUM & MAG HYDROXIDE-SIMETH 200-200-20 MG/5ML PO SUSP: 30.0000 mL | Freq: Four times a day (QID) | ORAL | Status: DC | PRN

## 2019-02-27 MED ORDER — ACETAMINOPHEN 325 MG PO TABS: 650.0000 mg | ORAL_TABLET | ORAL | Status: DC | PRN

## 2019-02-27 MED ORDER — ONDANSETRON HCL 4 MG PO TABS: 4.0000 mg | ORAL_TABLET | Freq: Three times a day (TID) | ORAL | Status: DC | PRN

## 2019-02-27 NOTE — ED Provider Notes (Signed)
[name] EMERGENCY DEPARTMENT Provider Note   CSN: [id number] Arrival date & time: 02/27/19  0509    History   Chief Complaint Chief Complaint  Patient presents with  . Hallucinations    HPI Cole Barrett is a 33 y.o. male.     HPI Patient presents to the emergency department with complaints of needing to speak with the behavioral health team.  The patient states he is been hallucinating.  He states that they are telling him bad things.  Patient denies being suicidal or homicidal.  Patient states that he has had no other issues.  He states that this happened in the past.  The patient denies chest pain, shortness of breath, headache,blurred vision, neck pain, fever, cough, weakness, numbness, dizziness, anorexia, edema, abdominal pain, nausea, vomiting, diarrhea, rash, back pain, dysuria, hematemesis, bloody stool, near syncope, or syncope. Past Medical History:  Diagnosis Date  . Homelessness   . Hypercholesterolemia   . Mental disorder   . Paranoid behavior (HCC)   . PE (pulmonary embolism)   . Schizophrenia [facility])     Patient Active Problem List   Diagnosis Date Noted  . Schizoaffective disorder, bipolar type (HCC) 01/19/2019  . Alcohol abuse   . Alcohol abuse with alcohol-induced mood disorder (HCC) 09/08/2016  . Homelessness 09/03/2016  . Person feigning illness 09/03/2016  . 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 Cannabis use disorder, mild, abuse 08/29/2016  . Pulmonary emboli (HCC) 07/13/2016  . Adjustment disorder with emotional disturbance 03/12/2014  . 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 Acute left flank pain 01/02/2014  . Pulmonary embolism, bilateral (HCC) 01/02/2014    No past surgical history on file.      Home Medications    Prior to Admission medications   Medication Sig Start Date End Date Taking? Authorizing Provider  risperiDONE (RISPERDAL) 1 MG tablet Take 1-4 tablets (1-4 mg total) by  mouth See admin instructions for 10 days. Take 1 mg every morning and 4 mg at bedtime. Patient taking differently: Take 1-3 mg by mouth See admin instructions. Take 1 mg every morning and 3 mg at bedtime. 02/18/19 02/28/19 Yes Derwood Kaplan, MD    Family History Family History  Problem Relation Age of Onset  . Hypertension Mother     Social History Social History   Tobacco Use  . Smoking status: Current Every Day Smoker    Packs/day: 0.50    Types: Cigarettes  . Smokeless tobacco: Never Used  Substance Use Topics  . Alcohol use: Yes    Comment: BAC 84  . Drug use: Yes    Types: Marijuana    Comment: UDS not available at time of AX     Allergies   Haldol [haloperidol]   Review of Systems Review of Systems All other systems negative except as documented in the HPI. All pertinent positives and negatives as reviewed in the HPI.  Physical Exam Updated Vital Signs BP 128/76   Pulse 87   SpO2 96%   Physical Exam Vitals signs and nursing note reviewed.  Constitutional:      General: He is not in acute distress.    Appearance: He is well-developed.  HENT:     Head: Normocephalic and atraumatic.  Eyes:     Pupils: Pupils are equal, round, and reactive to light.  Neck:     Musculoskeletal: Normal range of motion and neck supple.  Cardiovascular:     Rate and Rhythm: Normal rate and regular rhythm.  Heart sounds: Normal heart sounds. No murmur. No friction rub. No gallop.   Pulmonary:     Effort: Pulmonary effort is normal. No respiratory distress.     Breath sounds: Normal breath sounds. No wheezing.  Abdominal:     General: Bowel sounds are normal. There is no distension.     Palpations: Abdomen is soft.     Tenderness: There is no abdominal tenderness.  Skin:    General: Skin is warm and dry.     Capillary Refill: Capillary refill takes less than 2 seconds.     Findings: No erythema or rash.  Neurological:     Mental Status: He is alert and oriented to  person, place, and time.     Motor: No abnormal muscle tone.     Coordination: Coordination normal.  Psychiatric:        Attention and Perception: He perceives auditory hallucinations.        Behavior: Behavior normal.      ED Treatments / Results  Labs (all labs ordered are listed, but only abnormal results are displayed) Labs Reviewed  COMPREHENSIVE METABOLIC PANEL - Abnormal; Notable for the following components:      Result Value   CO2 20 (*)    Glucose, Bld 105 (*)    ALT 47 (*)    All other components within normal limits  ETHANOL - Abnormal; Notable for the following components:   Alcohol, Ethyl (B) 84 (*)    All other components within normal limits  CBC WITH DIFFERENTIAL/PLATELET - Abnormal; Notable for the following components:   Hemoglobin 12.8 (*)    Abs Immature Granulocytes 0.08 (*)    All other components within normal limits    EKG None  Radiology No results found.  Procedures Procedures (including critical care time)  Medications Ordered in ED Medications - No data to display   Initial Impression / Assessment and Plan / ED Course  I have reviewed the triage vital signs and the nursing notes.  Pertinent labs & imaging results that were available during my care of the patient were reviewed by me and considered in my medical decision making (see chart for details).        Patient will be evaluated by TTS for his hallucinations.  Patient is otherwise stable here in the emergency department.  Final Clinical Impressions(s) / ED Diagnoses   Final diagnoses:  Hallucinations    ED Discharge Orders    None       Rosemarie, Mauricio, PA-C 02/27/19 1501    Gwyneth Sprout, MD 02/27/19 1505

## 2019-02-27 NOTE — ED Notes (Signed)
Patient verbalizes understanding of discharge instructions. Opportunity for questioning and answers were provided. Armband removed by staff, pt discharged from ED. Ambulated out to lobby with d/c papers and belongings

## 2019-02-27 NOTE — ED Triage Notes (Signed)
Pt stating he is hearing voices and wants help from "[facility] team". Pt states the last time he had his meds was yesterday. Pt denies any thoughts of hurting his self or others

## 2019-02-27 NOTE — Discharge Instructions (Addendum)
Return here as needed. °

## 2019-02-27 NOTE — BH Assessment (Signed)
Tele Assessment Note   Patient Name: Cole Barrett MRN: [id number] Referring Physician: Charlestine Night, PA-C Location of Patient: [facility] Location of Provider: Behavioral Health TTS Department  Gaberial Bois is a 33 y.o. male who presented to [facility] on voluntary basis with complaint of auditory hallucination -- voices telling him that he will be hurt.  This is Pt's fifth presentation to the ED in 2020 for similar complaint.  Per history, Pt is followed by [name], although Pt denied having any psychiatric care.  Pt lives alone in [place], and he stated that he is unemployed.    Pt reported that yesterday he started experiencing auditory hallucinations, specifically voices telling him that he would be harmed or killed.  Pt stated ''I really want to go to [facility].''  Pt denied suicidal ideation, homicidal ideation, visual hallucination, self-injurious behavior.  Pt admitted to drinking beer weekly -- last use was 02/26/2019.  BAC on admission was 84.  Pt stated that he consumed two 40 oz beers. UDS was not available at time of assessment.  Per history, Pt receives outpatient psychiatric treatment from [facility], but he denied any psychiatric care.  Pt stated that he wants the voices to go away.    Pt was assessed by TTS on 01/18/2019.  At that time, he presented with similar complaint.  He was observed overnight and discharged the following day.  Pt is well-known to the [facility] EDs, having numerous assessments by TTS for similar presenting concerns.  Per history, Pt has a diagnosis of Schizophrenia.  During assessment, Pt presented as alert and oriented.  He was dressed in street clothes and appeared disheveled.  Pt's mood was anxious and preoccupied.  Pt's affect was mood congruent.  Pt endorsed auditory hallucination without command.  Pt's speech was normal in rate, rhythm, and volume.  Thought content was logical and goal-oriented.  There was no evidence of delusion.  Pt's memory and  concentration were intact.  Insight, judgment, and impulse control were fair.  Consulted with S. Rankin, NP, who determined that -- as Pt is at baseline, and as he does not pose a threat to self or others -- he may be discharged with follow-up to outpatient provider.  Diagnosis: Schizophrenia  Past Medical History:  Past Medical History:  Diagnosis Date  . Homelessness   . Hypercholesterolemia   . Mental disorder   . Paranoid behavior (HCC)   . PE (pulmonary embolism)   . Schizophrenia (HCC)     No past surgical history on file.  Family History:  Family History  Problem Relation Age of Onset  . Hypertension Mother     Social History:  reports that he has been smoking cigarettes. He has been smoking about 0.50 packs per day. He has never used smokeless tobacco. He reports current alcohol use. He reports current drug use. Drug: Marijuana.  Additional Social History:  Alcohol / Drug Use Pain Medications: See MAR Prescriptions: See MAR Over the Counter: See MAR History of alcohol / drug use?: Yes Substance #1 Name of Substance 1: Alcohol 1 - Amount (size/oz): Varied -- 2 40 oz recently 1 - Frequency: Weekly 1 - Duration: Ongoing 1 - Last Use / Amount: 02/26/2019 Substance #2 Name of Substance 2: Marijuana(Pt denied use; per history, Pt last used in January 2020) 2 - Last Use / Amount: UDS   CIWA: CIWA-Ar BP: 128/76 Pulse Rate: 87 COWS:    Allergies:  Allergies  Allergen Reactions  . Haldol [Haloperidol] Shortness Of Breath  Home Medications: (Not in a hospital admission)   OB/GYN Status:  No LMP for male patient.  General Assessment Data Location of Assessment: [facility] ED TTS Assessment: In system Is this a Tele or Face-to-Face Assessment?: Tele Assessment Is this an Initial Assessment or a Re-assessment for this encounter?: Initial Assessment Patient Accompanied by:: N/A Living Arrangements: Other (Comment)(Single family home) What gender do you identify as?:  Male Marital status: Single Pregnancy Status: No Living Arrangements: Alone Can pt return to current living arrangement?: Yes Admission Status: Voluntary Is patient capable of signing voluntary admission?: Yes Referral Source: Self/Family/Friend Insurance type: Self pay     Crisis Care Plan Living Arrangements: Alone Legal Guardian: Other:(Self) Name of Psychiatrist: [facility](Per history; Pt told Thereasa Parkin he is not followed) Name of Therapist: None  Education Status Is patient currently in school?: No Is the patient employed, unemployed or receiving disability?: Unemployed  Risk to self with the past 6 months Suicidal Ideation: No Has patient been a risk to self within the past 6 months prior to admission? : No Suicidal Intent: No Has patient had any suicidal intent within the past 6 months prior to admission? : No Is patient at risk for suicide?: No Suicidal Plan?: No Has patient had any suicidal plan within the past 6 months prior to admission? : No Access to Means: No Previous Attempts/Gestures: No Triggers for Past Attempts: Unknown Intentional Self Injurious Behavior: None Family Suicide History: No Recent stressful life event(s): Other (Comment)(increased hallucination) Persecutory voices/beliefs?: No Depression: No Depression Symptoms: (None endorsed) Substance abuse history and/or treatment for substance abuse?: No Suicide prevention information given to non-admitted patients: Not applicable  Risk to Others within the past 6 months Homicidal Ideation: No Does patient have any lifetime risk of violence toward others beyond the six months prior to admission? : No Thoughts of Harm to Others: No Current Homicidal Intent: No Current Homicidal Plan: No Access to Homicidal Means: No History of harm to others?: No Assessment of Violence: None Noted Does patient have access to weapons?: No Criminal Charges Pending?: No Does patient have a court date: No Is patient on  probation?: No  Psychosis Hallucinations: Auditory(Voices saying he will get hurt) Delusions: None noted  Mental Status Report Appearance/Hygiene: Unremarkable Eye Contact: Good Motor Activity: Unremarkable Speech: Logical/coherent Level of Consciousness: Alert Mood: Pleasant, Apprehensive Affect: Appropriate to circumstance Anxiety Level: Moderate Thought Processes: Relevant, Coherent Judgement: Partial Orientation: Person, Place, Situation, Time Obsessive Compulsive Thoughts/Behaviors: None  Cognitive Functioning Concentration: Normal Memory: Recent Intact, Remote Intact Is patient IDD: No Insight: Fair Impulse Control: Fair Appetite: Good Have you had any weight changes? : No Change Sleep: No Change Total Hours of Sleep: 6 Vegetative Symptoms: None  ADLScreening [facility] Assessment Services) Patient's cognitive ability adequate to safely complete daily activities?: Yes Patient able to express need for assistance with ADLs?: Yes Independently performs ADLs?: Yes (appropriate for developmental age)  Prior Inpatient Therapy Prior Inpatient Therapy: Yes Prior Therapy Dates: 2019, 2018 Prior Therapy Facilty/Provider(s): [facility], [facility] Reason for Treatment: MH issues  Prior Outpatient Therapy Prior Outpatient Therapy: Yes Prior Therapy Dates: Ongoing Prior Therapy Facilty/Provider(s): [facility](Pt denies current services) Reason for Treatment: Med mang Does patient have an ACCT team?: No Does patient have Intensive In-House Services?  : No Does patient have [facility] services? : Yes Does patient have P4CC services?: No  ADL Screening (condition at time of admission) Patient's cognitive ability adequate to safely complete daily activities?: Yes Is the patient deaf or have difficulty hearing?: No Does the  patient have difficulty seeing, even when wearing glasses/contacts?: No Does the patient have difficulty concentrating, remembering, or making decisions?: No Patient able to  express need for assistance with ADLs?: Yes Does the patient have difficulty dressing or bathing?: No Independently performs ADLs?: Yes (appropriate for developmental age) Does the patient have difficulty walking or climbing stairs?: No Weakness of Legs: None Weakness of Arms/Hands: None  Home Assistive Devices/Equipment Home Assistive Devices/Equipment: None  Therapy Consults (therapy consults require a physician order) PT Evaluation Needed: No OT Evalulation Needed: No SLP Evaluation Needed: No Abuse/Neglect Assessment (Assessment to be complete while patient is alone) Abuse/Neglect Assessment Can Be Completed: Yes Physical Abuse: Denies Verbal Abuse: Denies Sexual Abuse: Denies Exploitation of patient/patient's resources: Denies Self-Neglect: Denies Values / Beliefs Cultural Requests During Hospitalization: None Spiritual Requests During Hospitalization: None Consults Spiritual Care Consult Needed: No Social Work Consult Needed: No Merchant navy officer (For Healthcare) Does Patient Have a Medical Advance Directive?: No Would patient like information on creating a medical advance directive?: No - Patient declined          Disposition:  Disposition Initial Assessment Completed for this Encounter: Yes Disposition of Patient: Discharge(Per S. Rankin, NP, Pt does not meet intp criteria)  This service was provided via telemedicine using a 2-way, interactive audio and video technology.  Names of all persons participating in this telemedicine service and their role in this encounter. Name: Othel, Krenek Role: Pt             Dorris Fetch Marlane Hirschmann 02/27/2019 10:08 AM

## 2019-02-28 ENCOUNTER — Other Ambulatory Visit: Payer: Self-pay

## 2019-02-28 ENCOUNTER — Encounter (HOSPITAL_COMMUNITY): Payer: Self-pay

## 2019-02-28 ENCOUNTER — Emergency Department (HOSPITAL_COMMUNITY)
Admission: EM | Admit: 2019-02-28 | Discharge: 2019-02-28 | Disposition: A | Discharge status: Home / Self Care | Payer: Self-pay | Attending: Emergency Medicine | Admitting: Emergency Medicine

## 2019-02-28 DIAGNOSIS — Z760 Encounter for issue of repeat prescription: Secondary | ICD-10-CM | POA: Insufficient documentation

## 2019-02-28 DIAGNOSIS — F17210 Nicotine dependence, cigarettes, uncomplicated: Secondary | ICD-10-CM | POA: Insufficient documentation

## 2019-02-28 DIAGNOSIS — R440 Auditory hallucinations: Secondary | ICD-10-CM | POA: Insufficient documentation

## 2019-02-28 DIAGNOSIS — Z79899 Other long term (current) drug therapy: Secondary | ICD-10-CM | POA: Insufficient documentation

## 2019-02-28 MED ORDER — RISPERIDONE 2 MG PO TABS
3.0000 mg | ORAL_TABLET | Freq: Once | ORAL | Status: AC
  Administered 2019-02-28: 3 mg via ORAL
  Filled 2019-02-28: qty 1

## 2019-02-28 NOTE — ED Provider Notes (Signed)
[facility]-EMERGENCY DEPT Provider Note   CSN: [id number] Arrival date & time: 02/28/19  0330    History   Chief Complaint Chief Complaint  Patient presents with  . Medication Refill    HPI Niv Luisi is a 33 y.o. male.     Patient presents to the emergency department with a chief complaint of hallucinations.  He states that he hears voices.  States that he is out of his Risperdal.  States his last dose was a couple of days ago.  Denies any SI or HI.  States that he went to [facility], but they were closed, and he could not get his medications refilled.  Denies any other associated problems.  The history is provided by the patient. No language interpreter was used.    Past Medical History:  Diagnosis Date  . Homelessness   . Hypercholesterolemia   . Mental disorder   . Paranoid behavior (HCC)   . PE (pulmonary embolism)   . Schizophrenia [facility])     Patient Active Problem List   Diagnosis Date Noted  . Schizoaffective disorder, bipolar type (HCC) 01/19/2019  . Alcohol abuse   . Alcohol abuse with alcohol-induced mood disorder (HCC) 09/08/2016  . Homelessness 09/03/2016  . Person feigning illness 09/03/2016  . 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 Cannabis use disorder, mild, abuse 08/29/2016  . Pulmonary emboli (HCC) 07/13/2016  . Adjustment disorder with emotional disturbance 03/12/2014  . 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 Acute left flank pain 01/02/2014  . Pulmonary embolism, bilateral (HCC) 01/02/2014    History reviewed. No pertinent surgical history.      Home Medications    Prior to Admission medications   Medication Sig Start Date End Date Taking? Authorizing Provider  risperiDONE (RISPERDAL) 1 MG tablet Take 1-4 tablets (1-4 mg total) by mouth See admin instructions for 10 days. Take 1 mg every morning and 4 mg at bedtime. Patient taking differently: Take 1-3 mg by mouth See admin  instructions. Take 1 mg every morning and 3 mg at bedtime. 02/18/19 02/28/19 Yes Derwood Kaplan, MD    Family History Family History  Problem Relation Age of Onset  . Hypertension Mother     Social History Social History   Tobacco Use  . Smoking status: Current Every Day Smoker    Packs/day: 0.50    Types: Cigarettes  . Smokeless tobacco: Never Used  Substance Use Topics  . Alcohol use: Yes    Comment: BAC 84  . Drug use: Yes    Types: Marijuana    Comment: UDS not available at time of AX     Allergies   Haldol [haloperidol]   Review of Systems Review of Systems  All other systems reviewed and are negative.    Physical Exam Updated Vital Signs BP (!) 137/94 (BP Location: Left Arm)   Pulse 73   Temp (!) 97.4 F (36.3 C) (Oral)   Resp 17   SpO2 99%   Physical Exam Vitals signs and nursing note reviewed.  Constitutional:      General: He is not in acute distress.    Appearance: He is not diaphoretic.  HENT:     Head: Normocephalic and atraumatic.  Eyes:     Conjunctiva/sclera: Conjunctivae normal.     Pupils: Pupils are equal, round, and reactive to light.  Neck:     Trachea: No tracheal deviation.  Cardiovascular:     Rate and Rhythm: Normal rate.  Pulmonary:  Effort: Pulmonary effort is normal. No respiratory distress.  Abdominal:     Palpations: Abdomen is soft.  Musculoskeletal: Normal range of motion.  Skin:    General: Skin is warm and dry.  Neurological:     Mental Status: He is alert and oriented to person, place, and time.  Psychiatric:        Judgment: Judgment normal.      ED Treatments / Results  Labs (all labs ordered are listed, but only abnormal results are displayed) Labs Reviewed - No data to display  EKG None  Radiology No results found.  Procedures Procedures (including critical care time)  Medications Ordered in ED Medications - No data to display   Initial Impression / Assessment and Plan / ED Course  I  have reviewed the triage vital signs and the nursing notes.  Pertinent labs & imaging results that were available during my care of the patient were reviewed by me and considered in my medical decision making (see chart for details).        Patient with auditory hallucinations.  Out of his Risperdal.  I will give him a dose here.  Advised him to follow-up with [facility].  He states that he will do so tomorrow.  He does not seem to be a danger to himself or others.  He is in no acute distress.  No additional work-up needed.  MSE complete.  Final Clinical Impressions(s) / ED Diagnoses   Final diagnoses:  Medication refill    ED Discharge Orders    None       Roxy Horseman, PA-C 02/28/19 0350    [name], MD 02/28/19 0530

## 2019-02-28 NOTE — ED Triage Notes (Signed)
Pt arrived stating he needs medications for the "voices to calm down" Pt states he needs a prescription for Risperdal, his last dose was a couple days ago.

## 2019-03-09 ENCOUNTER — Emergency Department (HOSPITAL_COMMUNITY)
Admission: EM | Admit: 2019-03-09 | Discharge: 2019-03-09 | Disposition: A | Discharge status: Home / Self Care | Payer: Self-pay | Attending: Emergency Medicine | Admitting: Emergency Medicine

## 2019-03-09 ENCOUNTER — Other Ambulatory Visit: Payer: Self-pay

## 2019-03-09 ENCOUNTER — Encounter (HOSPITAL_COMMUNITY): Payer: Self-pay

## 2019-03-09 DIAGNOSIS — Z760 Encounter for issue of repeat prescription: Secondary | ICD-10-CM

## 2019-03-09 DIAGNOSIS — R440 Auditory hallucinations: Secondary | ICD-10-CM | POA: Insufficient documentation

## 2019-03-09 DIAGNOSIS — F209 Schizophrenia, unspecified: Secondary | ICD-10-CM | POA: Insufficient documentation

## 2019-03-09 DIAGNOSIS — Z590 Homelessness: Secondary | ICD-10-CM | POA: Insufficient documentation

## 2019-03-09 DIAGNOSIS — F17210 Nicotine dependence, cigarettes, uncomplicated: Secondary | ICD-10-CM | POA: Insufficient documentation

## 2019-03-09 MED ORDER — RISPERIDONE 2 MG PO TABS
3.0000 mg | ORAL_TABLET | Freq: Once | ORAL | Status: AC
  Administered 2019-03-09: 3 mg via ORAL
  Filled 2019-03-09: qty 1

## 2019-03-09 NOTE — ED Triage Notes (Signed)
Pt requesting Risperdal.

## 2019-03-09 NOTE — ED Notes (Signed)
Bed: [facility] Expected date:  Expected time:  Means of arrival:  Comments: 

## 2019-03-09 NOTE — ED Provider Notes (Signed)
[facility]-EMERGENC[facility] DEPT Provider Note  CSN: [id number] Arrival date & time: 03/09/19 4801  Chief Complaint(s) Medication Refill  HPI Cole Barrett is a 33 y.o. male with a history of schizophrenia on Risperdal who presents to the emergency department for assistance with his medication.  Patient reports that [facility] outpatient clinic is closed and he was unable to get his Risperdal.  He is endorsing chronic auditory hallucinations that are non-commanding.  He denies any suicidal ideation, homicidal ideation.  Denies any physical complaints.  HPI  Past Medical History Past Medical History:  Diagnosis Date  . Homelessness   . Hypercholesterolemia   . Mental disorder   . Paranoid behavior (HCC)   . PE (pulmonary embolism)   . Schizophrenia [facility])    Patient Active Problem List   Diagnosis Date Noted  . Schizoaffective disorder, bipolar type (HCC) 01/19/2019  . Alcohol abuse   . Alcohol abuse with alcohol-induced mood disorder (HCC) 09/08/2016  . Homelessness 09/03/2016  . Person feigning illness 09/03/2016  . 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 Cannabis use disorder, mild, abuse 08/29/2016  . Pulmonary emboli (HCC) 07/13/2016  . Adjustment disorder with emotional disturbance 03/12/2014  . 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 Acute left flank pain 01/02/2014  . Pulmonary embolism, bilateral (HCC) 01/02/2014   Home Medication(s) Prior to Admission medications   Medication Sig Start Date End Date Taking? Authorizing Provider  risperiDONE (RISPERDAL) 1 MG tablet Take 1-4 tablets (1-4 mg total) by mouth See admin instructions for 10 days. Take 1 mg every morning and 4 mg at bedtime. Patient taking differently: Take 1-3 mg by mouth See admin instructions. Take 1 mg every morning and 3 mg at bedtime. 02/18/19 02/28/19  Derwood Kaplan, MD                                         Past Surgical History History reviewed. No pertinent surgical history. Family History Family History  Problem Relation Age of Onset  . Hypertension Mother     Social History Social History   Tobacco Use  . Smoking status: Current Every Day Smoker    Packs/day: 0.50    Types: Cigarettes  . Smokeless tobacco: Never Used  Substance Use Topics  . Alcohol use: Yes    Comment: BAC 84  . Drug use: Yes    Types: Marijuana    Comment: UDS not available at time of AX   Allergies Haldol [haloperidol]  Review of Systems Review of Systems All other systems are reviewed and are negative for acute change except as noted in the HPI  Physical Exam Vital Signs  I have reviewed the triage vital signs BP 122/79 (BP Location: Left Arm)   Pulse 75   Temp (!) 97.5 F (36.4 C) (Oral)   Resp 16   Ht 5\' 10"  (1.778 m)   Wt 108.9 kg   SpO2 98%   BMI 34.44 kg/m   Physical Exam Vitals signs reviewed.  Constitutional:      General: He is not in acute distress.    Appearance: He is well-developed. He is not diaphoretic.  HENT:     Head: Normocephalic and atraumatic.     Jaw: No trismus.     Right Ear: External ear normal.     Left Ear: External ear normal.     Nose: Nose normal.  Eyes:  General: No scleral icterus.    Conjunctiva/sclera: Conjunctivae normal.  Neck:     Musculoskeletal: Normal range of motion.     Trachea: Phonation normal.  Cardiovascular:     Rate and Rhythm: Normal rate and regular rhythm.  Pulmonary:     Effort: Pulmonary effort is normal. No respiratory distress.     Breath sounds: No stridor.  Abdominal:     General: There is no distension.  Musculoskeletal: Normal range of motion.  Neurological:     Mental Status: He is alert and oriented to person, place, and time.  Psychiatric:        Behavior: Behavior normal.     ED Results and Treatments Labs (all labs ordered are listed, but only abnormal results are  displayed) Labs Reviewed - No data to display                                                                                                                       EKG  EKG Interpretation  Date/Time:    Ventricular Rate:    PR Interval:    QRS Duration:   QT Interval:    QTC Calculation:   R Axis:     Text Interpretation:        Radiology No results found. Pertinent labs & imaging results that were available during my care of the patient were reviewed by me and considered in my medical decision making (see chart for details).  Medications Ordered in ED Medications  risperiDONE (RISPERDAL) tablet 3 mg (has no administration in time range)                                                                                                                                    Procedures Procedures  (including critical care time)  Medical Decision Making / ED Course I have reviewed the nursing notes for this encounter and the patient's prior records (if available in EHR or on provided paperwork).    Given his nighttime dose of Risperdal.  We called [facility] and they said that their outpatient clinic is closed.  Instructed patient to return in the morning, so that we can have social worker assist with making sure the patient gets his Risperdal filled.  The patient appears reasonably screened and/or stabilized for discharge and I doubt any other medical condition or other [facility] requiring further screening, evaluation, or treatment in the ED at this  time prior to discharge.  The patient is safe for discharge with strict return precautions.   Final Clinical Impression(s) / ED Diagnoses Final diagnoses:  Medication refill    Disposition: Discharge  Condition: Good    ED Discharge Orders    None       Follow Up: [facility] [address] [id number] mc [place] [address] [phone] Go to  in the morning for further  assistance by social worker     This chart was dictated using voice recognition software.  Despite best efforts to proofread,  errors can occur which can change the documentation meaning.   [name], MD 03/09/19 [phone]

## 2019-03-12 ENCOUNTER — Encounter (HOSPITAL_COMMUNITY): Payer: Self-pay | Admitting: *Deleted

## 2019-03-12 ENCOUNTER — Emergency Department (HOSPITAL_COMMUNITY)
Admission: EM | Admit: 2019-03-12 | Discharge: 2019-03-13 | Disposition: A | Discharge status: Home / Self Care | Payer: Self-pay | Attending: Emergency Medicine | Admitting: Emergency Medicine

## 2019-03-12 ENCOUNTER — Other Ambulatory Visit: Payer: Self-pay

## 2019-03-12 DIAGNOSIS — R030 Elevated blood-pressure reading, without diagnosis of hypertension: Secondary | ICD-10-CM | POA: Insufficient documentation

## 2019-03-12 DIAGNOSIS — R440 Auditory hallucinations: Secondary | ICD-10-CM | POA: Insufficient documentation

## 2019-03-12 DIAGNOSIS — Z79899 Other long term (current) drug therapy: Secondary | ICD-10-CM | POA: Insufficient documentation

## 2019-03-12 DIAGNOSIS — Z760 Encounter for issue of repeat prescription: Secondary | ICD-10-CM | POA: Insufficient documentation

## 2019-03-12 DIAGNOSIS — F17210 Nicotine dependence, cigarettes, uncomplicated: Secondary | ICD-10-CM | POA: Insufficient documentation

## 2019-03-12 NOTE — ED Notes (Signed)
Bed: WTR5 Expected date:  Expected time:  Means of arrival:  Comments: 

## 2019-03-12 NOTE — ED Triage Notes (Signed)
Pt stated "I ran out of my risperdal x 2 days ago and I'm hearing voices because I'm out.  I've tried to call [facility] but it wasn't open."

## 2019-03-13 MED ORDER — RISPERIDONE 2 MG PO TABS
3.0000 mg | ORAL_TABLET | Freq: Once | ORAL | Status: AC
  Administered 2019-03-13: 3 mg via ORAL
  Filled 2019-03-13: qty 1

## 2019-03-13 MED ORDER — RISPERIDONE 3 MG PO TABS: 3.0000 mg | ORAL_TABLET | Freq: Every day | ORAL | 0 refills | Status: DC

## 2019-03-13 MED ORDER — RISPERIDONE 1 MG PO TABS: 1.0000 mg | ORAL_TABLET | Freq: Every day | ORAL | 0 refills | Status: DC

## 2019-03-13 NOTE — Discharge Instructions (Addendum)
Make sure to take your medication every day.

## 2019-03-13 NOTE — ED Provider Notes (Signed)
[facility]-EMERGENCY DEPT Provider Note   CSN: [id number] Arrival date & time: 03/12/19  2338    History   Chief Complaint Chief Complaint  Patient presents with  . Medication Refill    HPI Cole Barrett is a 33 y.o. male.  The history is provided by the patient.  He has history of schizophrenia, homelessness, hyperlipidemia, pulmonary embolism and comes in because he has run out of his medication.  He normally takes risperidone 1 mg in the morning and 3 mg at bedtime.  He [facility], but they are closed because of the coronavirus.  He does admit to auditory hallucinations which are chronic.  He states that he hears voices which tell him to get some money.  Past Medical History:  Diagnosis Date  . Homelessness   . Hypercholesterolemia   . Mental disorder   . Paranoid behavior (HCC)   . PE (pulmonary embolism)   . Schizophrenia [facility](HCC)     Patient Active Problem List   Diagnosis Date Noted  . Schizoaffective disorder, bipolar type (HCC) 01/19/2019  . Alcohol abuse   . Alcohol abuse with alcohol-induced mood disorder (HCC) 09/08/2016  . Homelessness 09/03/2016  . Person feigning illness 09/03/2016  . 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 Cannabis use disorder, mild, abuse 08/29/2016  . Pulmonary emboli (HCC) 07/13/2016  . Adjustment disorder with emotional disturbance 03/12/2014  . 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 Acute left flank pain 01/02/2014  . Pulmonary embolism, bilateral (HCC) 01/02/2014    History reviewed. No pertinent surgical history.      Home Medications    Prior to Admission medications   Medication Sig Start Date End Date Taking? Authorizing Provider  risperiDONE (RISPERDAL) 1 MG tablet Take 1 tablet (1 mg total) by mouth daily. Take 1 mg every morning 03/13/19   Dione BoozeGlick, Wanisha Shiroma, MD  risperiDONE (RISPERDAL) 3 MG tablet Take 1 tablet (3 mg total) by  mouth at bedtime. 03/13/19   Dione BoozeGlick, Shanese Riemenschneider, MD    Family History Family History  Problem Relation Age of Onset  . Hypertension Mother     Social History Social History   Tobacco Use  . Smoking status: Current Every Day Smoker    Packs/day: 0.50    Types: Cigarettes  . Smokeless tobacco: Never Used  Substance Use Topics  . Alcohol use: Yes    Comment: BAC 84  . Drug use: Yes    Types: Marijuana    Comment: UDS not available at time of AX     Allergies   Haldol [haloperidol]   Review of Systems Review of Systems  All other systems reviewed and are negative.    Physical Exam Updated Vital Signs BP (!) 142/83 (BP Location: Right Arm)   Pulse 82   Temp 98 F (36.7 C) (Oral)   Resp 16   Ht 5\' 10"  (1.778 m)   SpO2 97%   BMI 34.44 kg/m   Physical Exam Vitals signs and nursing note reviewed.    33 year old male, resting comfortably and in no acute distress. Vital signs are significant for borderline elevated blood pressure. Oxygen saturation is 97%, which is normal. Head is normocephalic and atraumatic. PERRLA, EOMI. Oropharynx is clear. Neck is nontender and supple without adenopathy or JVD. Back is nontender and there is no CVA tenderness. Lungs are clear without rales, wheezes, or rhonchi. Chest is nontender. Heart has regular rate and rhythm without murmur. Abdomen is soft,  flat, nontender without masses or hepatosplenomegaly and peristalsis is normoactive. Extremities have no cyanosis or edema, full range of motion is present. Skin is warm and dry without rash. Neurologic: Mental status is normal, cranial nerves are intact, there are no motor or sensory deficits.  ED Treatments / Results   Procedures Procedures   Medications Ordered in ED Medications  risperiDONE (RISPERDAL) tablet 3 mg (has no administration in time range)     Initial Impression / Assessment and Plan / ED Course  I have reviewed the triage vital signs and the nursing notes.   Schizophrenia with chronic auditory hallucinations.  He is given a prescription for 1 month supply of his risperidone.  Old records are reviewed, and he has multiple similar ED visits in the past.  Final Clinical Impressions(s) / ED Diagnoses   Final diagnoses:  Medication refill  Auditory hallucinations    ED Discharge Orders         Ordered    risperiDONE (RISPERDAL) 1 MG tablet  Daily     03/13/19 0026    risperiDONE (RISPERDAL) 3 MG tablet  Daily at bedtime     03/13/19 0026           [name], MD 03/13/19 0030

## 2019-03-17 ENCOUNTER — Emergency Department (HOSPITAL_COMMUNITY)
Admission: EM | Admit: 2019-03-17 | Discharge: 2019-03-17 | Disposition: A | Discharge status: Home / Self Care | Payer: Self-pay | Attending: Emergency Medicine | Admitting: Emergency Medicine

## 2019-03-17 ENCOUNTER — Encounter (HOSPITAL_COMMUNITY): Payer: Self-pay

## 2019-03-17 ENCOUNTER — Other Ambulatory Visit: Payer: Self-pay

## 2019-03-17 DIAGNOSIS — F10920 Alcohol use, unspecified with intoxication, uncomplicated: Secondary | ICD-10-CM

## 2019-03-17 DIAGNOSIS — Z79899 Other long term (current) drug therapy: Secondary | ICD-10-CM | POA: Insufficient documentation

## 2019-03-17 DIAGNOSIS — F17210 Nicotine dependence, cigarettes, uncomplicated: Secondary | ICD-10-CM | POA: Insufficient documentation

## 2019-03-17 DIAGNOSIS — Z590 Homelessness: Secondary | ICD-10-CM | POA: Insufficient documentation

## 2019-03-17 DIAGNOSIS — Y904 Blood alcohol level of 80-99 mg/100 ml: Secondary | ICD-10-CM | POA: Insufficient documentation

## 2019-03-17 DIAGNOSIS — R443 Hallucinations, unspecified: Secondary | ICD-10-CM

## 2019-03-17 DIAGNOSIS — F250 Schizoaffective disorder, bipolar type: Secondary | ICD-10-CM | POA: Insufficient documentation

## 2019-03-17 DIAGNOSIS — Z86711 Personal history of pulmonary embolism: Secondary | ICD-10-CM | POA: Insufficient documentation

## 2019-03-17 DIAGNOSIS — R440 Auditory hallucinations: Secondary | ICD-10-CM | POA: Insufficient documentation

## 2019-03-17 DIAGNOSIS — D649 Anemia, unspecified: Secondary | ICD-10-CM

## 2019-03-17 LAB — COMPREHENSIVE METABOLIC PANEL
ALT: 35 U/L (ref 0–44)
AST: 24 U/L (ref 15–41)
Albumin: 3.7 g/dL (ref 3.5–5.0)
Alkaline Phosphatase: 46 U/L (ref 38–126)
Anion gap: 12 (ref 5–15)
BUN: 11 mg/dL (ref 6–20)
CO2: 23 mmol/L (ref 22–32)
Calcium: 8.6 mg/dL — ABNORMAL LOW (ref 8.9–10.3)
Chloride: 102 mmol/L (ref 98–111)
Creatinine, Ser: 1.01 mg/dL (ref 0.61–1.24)
GFR calc Af Amer: >60 mL/min (ref >60)
GFR calc non Af Amer: >60 mL/min (ref >60)
Glucose, Bld: 101 mg/dL — ABNORMAL HIGH (ref 70–99)
Potassium: 3.7 mmol/L (ref 3.5–5.1)
Sodium: 137 mmol/L (ref 135–145)
Total Bilirubin: 0.7 mg/dL (ref 0.3–1.2)
Total Protein: 6.9 g/dL (ref 6.5–8.1)

## 2019-03-17 LAB — CBC WITH DIFFERENTIAL/PLATELET
Abs Immature Granulocytes: 0.05 10*3/uL (ref 0.00–0.07)
Basophils Absolute: 0 10*3/uL (ref 0.0–0.1)
Basophils Relative: 1 %
Eosinophils Absolute: 0.3 10*3/uL (ref 0.0–0.5)
Eosinophils Relative: 5 %
HCT: 39.5 % (ref 39.0–52.0)
Hemoglobin: 12.2 g/dL — ABNORMAL LOW (ref 13.0–17.0)
Immature Granulocytes: 1 %
Lymphocytes Relative: 36 %
Lymphs Abs: 1.8 10*3/uL (ref 0.7–4.0)
MCH: 27.1 pg (ref 26.0–34.0)
MCHC: 30.9 g/dL (ref 30.0–36.0)
MCV: 87.6 fL (ref 80.0–100.0)
Monocytes Absolute: 0.6 10*3/uL (ref 0.1–1.0)
Monocytes Relative: 13 %
Neutro Abs: 2.2 10*3/uL (ref 1.7–7.7)
Neutrophils Relative %: 44 %
Platelets: 318 10*3/uL (ref 150–400)
RBC: 4.51 MIL/uL (ref 4.22–5.81)
RDW: 12.9 % (ref 11.5–15.5)
WBC: 4.9 10*3/uL (ref 4.0–10.5)
nRBC: 0 % (ref 0.0–0.2)

## 2019-03-17 LAB — ETHANOL: Alcohol, Ethyl (B): 82 mg/dL — ABNORMAL HIGH (ref <10)

## 2019-03-17 LAB — RAPID URINE DRUG SCREEN, HOSP PERFORMED
Amphetamines: NOT DETECTED
Barbiturates: NOT DETECTED
Benzodiazepines: NOT DETECTED
Cocaine: NOT DETECTED
Opiates: NOT DETECTED
Tetrahydrocannabinol: NOT DETECTED

## 2019-03-17 LAB — ACETAMINOPHEN LEVEL: Acetaminophen (Tylenol), Serum: <10 ug/mL — ABNORMAL LOW (ref 10–30)

## 2019-03-17 LAB — SALICYLATE LEVEL: Salicylate Lvl: <7 mg/dL (ref 2.8–30.0)

## 2019-03-17 MED ORDER — NICOTINE 7 MG/24HR TD PT24
7.0000 mg | MEDICATED_PATCH | Freq: Every day | TRANSDERMAL | Status: DC
  Filled 2019-03-17: qty 1

## 2019-03-17 MED ORDER — RISPERIDONE 3 MG PO TABS
3.0000 mg | ORAL_TABLET | Freq: Once | ORAL | Status: AC
  Administered 2019-03-17: 3 mg via ORAL
  Filled 2019-03-17: qty 1

## 2019-03-17 MED ORDER — ACETAMINOPHEN 325 MG PO TABS: 650.0000 mg | ORAL_TABLET | ORAL | Status: DC | PRN

## 2019-03-17 MED ORDER — ONDANSETRON HCL 4 MG PO TABS: 4.0000 mg | ORAL_TABLET | Freq: Three times a day (TID) | ORAL | Status: DC | PRN

## 2019-03-17 MED ORDER — ALUM & MAG HYDROXIDE-SIMETH 200-200-20 MG/5ML PO SUSP: 30.0000 mL | Freq: Four times a day (QID) | ORAL | Status: DC | PRN

## 2019-03-17 NOTE — ED Triage Notes (Signed)
Pt comes with chronic complaint of hearing voices, unable to fill his prescriptions due to [facility] being closed, denies SI/HI

## 2019-03-17 NOTE — ED Notes (Signed)
TTS at bedside. 

## 2019-03-17 NOTE — ED Provider Notes (Signed)
[name] EMERGENCY DEPARTMENT Provider Note   CSN: [id number] Arrival date & time: 03/17/19  0515    History   Chief Complaint Chief Complaint  Patient presents with  . Hallucinations    HPI Cole Barrett is a 33 y.o. male.  The history is provided by the patient.  He has history of schizophrenia, pulmonary embolism, hyperlipidemia, homelessness and comes in because of auditory hallucinations.  He normally gets his risperidone filled through [facility], but has been unable to be seen there because of closure secondary to coronavirus.  He states that tonight his hallucinations became more severe and he could hear voices telling him that that they were going to kill him.  He denies any actual suicidal thoughts he denies any homicidal ideation.  He does admit to drinking a 40 ounce beer.  Past Medical History:  Diagnosis Date  . Homelessness   . Hypercholesterolemia   . Mental disorder   . Paranoid behavior (HCC)   . PE (pulmonary embolism)   . Schizophrenia [facility])     Patient Active Problem List   Diagnosis Date Noted  . Schizoaffective disorder, bipolar type (HCC) 01/19/2019  . Alcohol abuse   . Alcohol abuse with alcohol-induced mood disorder (HCC) 09/08/2016  . Homelessness 09/03/2016  . Person feigning illness 09/03/2016  . 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 Cannabis use disorder, mild, abuse 08/29/2016  . Pulmonary emboli (HCC) 07/13/2016  . Adjustment disorder with emotional disturbance 03/12/2014  . 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 Acute left flank pain 01/02/2014  . Pulmonary embolism, bilateral (HCC) 01/02/2014    No past surgical history on file.      Home Medications    Prior to Admission medications   Medication Sig Start Date End Date Taking? Authorizing Provider  risperiDONE (RISPERDAL) 1 MG tablet Take 1 tablet (1 mg total) by mouth daily. Take 1 mg every morning 03/13/19   [name], MD   risperiDONE (RISPERDAL) 3 MG tablet Take 1 tablet (3 mg total) by mouth at bedtime. 03/13/19   [name], MD    Family History Family History  Problem Relation Age of Onset  . Hypertension Mother     Social History Social History   Tobacco Use  . Smoking status: Current Every Day Smoker    Packs/day: 0.50    Types: Cigarettes  . Smokeless tobacco: Never Used  Substance Use Topics  . Alcohol use: Yes    Comment: BAC 84  . Drug use: Yes    Types: Marijuana    Comment: UDS not available at time of AX     Allergies   Haldol [haloperidol]   Review of Systems Review of Systems  All other systems reviewed and are negative.    Physical Exam Updated Vital Signs BP 138/85 (BP Location: Right Arm)   Pulse 75   Temp 97.8 F (36.6 C) (Oral)   Resp 18   Ht 5\' 10"  (1.778 m)   Wt 104.3 kg   SpO2 96%   BMI 33.00 kg/m   Physical Exam Vitals signs and nursing note reviewed.    33 year old male, resting comfortably and in no acute distress. Vital signs are normal. Oxygen saturation is 96%, which is normal. Head is normocephalic and atraumatic. PERRLA, EOMI. Oropharynx is clear. Neck is nontender and supple without adenopathy or JVD. Back is nontender and there is no CVA tenderness. Lungs are clear without rales, wheezes, or rhonchi. Chest is nontender. Heart has  regular rate and rhythm without murmur. Abdomen is soft, flat, nontender without masses or hepatosplenomegaly and peristalsis is normoactive. Extremities have no cyanosis or edema, full range of motion is present. Skin is warm and dry without rash. Neurologic: Awake and alert and oriented, mildly agitated, admitting to auditory hallucinations but does not appear to be reacting to internal stimuli, cranial nerves are intact, there are no motor or sensory deficits.  ED Treatments / Results  Labs (all labs ordered are listed, but only abnormal results are displayed) Labs Reviewed  ACETAMINOPHEN LEVEL -  Abnormal; Notable for the following components:      Result Value   Acetaminophen (Tylenol), Serum <10 (*)    All other components within normal limits  COMPREHENSIVE METABOLIC PANEL - Abnormal; Notable for the following components:   Glucose, Bld 101 (*)    Calcium 8.6 (*)    All other components within normal limits  ETHANOL - Abnormal; Notable for the following components:   Alcohol, Ethyl (B) 82 (*)    All other components within normal limits  CBC WITH DIFFERENTIAL/PLATELET - Abnormal; Notable for the following components:   Hemoglobin 12.2 (*)    All other components within normal limits  SALICYLATE LEVEL  RAPID URINE DRUG SCREEN, HOSP PERFORMED    Procedures Procedures   Medications Ordered in ED Medications  nicotine (NICODERM CQ - dosed in mg/24 hr) patch 7 mg (has no administration in time range)  alum & mag hydroxide-simeth (MAALOX/MYLANTA) 200-200-20 MG/5ML suspension 30 mL (has no administration in time range)  ondansetron (ZOFRAN) tablet 4 mg (has no administration in time range)  acetaminophen (TYLENOL) tablet 650 mg (has no administration in time range)  risperiDONE (RISPERDAL) tablet 3 mg (3 mg Oral Given 03/17/19 0546)     Initial Impression / Assessment and Plan / ED Course  I have reviewed the triage vital signs and the nursing notes.  Pertinent labg results that were available during my care of the patient were reviewed by me and considered in my medical decision making (see chart for details).  Auditory hallucinations.  Medication noncompliance.  Old records are reviewed, and he has several ED visits recently attempting to get prescription for risperidone refilled.  He was seen 4 days ago and prescription was given, but he states he was unable to fill it.  Since he seems to have worsening hallucinations, will ask CTS to evaluate.  Also, will ask care management to see him about getting a refill of his prescription for risperidone.  Labs show stable anemia.   Ethanol level was at the lower end of legal intoxication.  TTS consultation is appreciated, patient does not meet inpatient criteria.  He will be held for care management to try to arrange for his medication.  Final Clinical Impressions(s) / ED Diagnoses   Final diagnoses:  Hallucinations  Normochromic normocytic anemia  Alcohol intoxication, uncomplicated [facility](HCC)    ED Discharge Orders    None       Dione BoozeGlick, Nakyah Erdmann, MD 03/17/19 [phone]

## 2019-03-17 NOTE — ED Notes (Signed)
Pharmacy unable to fill patient's home prescription for risperdal on weekend day.

## 2019-03-17 NOTE — ED Notes (Signed)
Message left for case management. Patient currently sleeping.

## 2019-03-17 NOTE — BH Assessment (Addendum)
Tele Assessment Note   Patient Name: Cole Barrett MRN: [id number] Referring Physician: [name], MD Location of Patient: [facility] Location of Provider: Behavioral Health TTS Department  Cole Barrett is an 33 y.o. male who presents to the ED voluntarily. Pt reports he has been experiencing worsening AH that tell him they are going to kill him. Pt states he has not taken his medication in several months due to not being able to go to [place]. Pt states he wants to be admitted to an inpt hospital so that he can be monitored and have his medication refilled. Pt denies SI and denies HI at present. Pt states he has been managing his AH however tonight things got worse prompting him to come to the ED. Pt is well known to this ED and has been assessed by TTS multiple times c/o "voices telling me they are going to kill me." Pt's most recent TTS assessment occurred on 02/27/19 and at that time pt reported "auditory hallucination -- voices telling him that he will be hurt." Pt has 34 ED visits within the past 6 months.   TTS consulted with [name], NP who states the pt does not meet inpt criteria. Pt is recommended to follow up with his current OPT provider in order to receive medication management. EDP [name], MD and pt's nurse Letha Cape, RN have been advised.   Diagnosis: Schizophrenia   Past Medical History:  Past Medical History:  Diagnosis Date  . Homelessness   . Hypercholesterolemia   . Mental disorder   . Paranoid behavior (HCC)   . PE (pulmonary embolism)   . Schizophrenia (HCC)     History reviewed. No pertinent surgical history.  Family History:  Family History  Problem Relation Age of Onset  . Hypertension Mother     Social History:  reports that he has been smoking cigarettes. He has been smoking about 0.50 packs per day. He has never used smokeless tobacco. He reports current alcohol use. He reports current drug use. Drug:  Marijuana.  Additional Social History:  Alcohol / Drug Use Pain Medications: See MAR Prescriptions: See MAR Over the Counter: See MAR History of alcohol / drug use?: Yes Longest period of sobriety (when/how long): 2 months Substance #1 Name of Substance 1: Alcohol 1 - Age of First Use: adolescent 1 - Amount (size/oz): varies 1 - Frequency: weekly 1 - Duration: ongoing 1 - Last Use / Amount: 2 weeks ago Substance #2 Name of Substance 2: Marijuana 2 - Age of First Use: 16 2 - Amount (size/oz): Varies 2 - Frequency: unknown 2 - Duration: ongoing 2 - Last Use / Amount: UDS postive is January 2020  CIWA: CIWA-Ar BP: 138/85 Pulse Rate: 75 COWS:    Allergies:  Allergies  Allergen Reactions  . Haldol [Haloperidol] Shortness Of Breath    Home Medications: (Not in a hospital admission)   OB/GYN Status:  No LMP for male patient.  General Assessment Data Location of Assessment: [facility] ED TTS Assessment: In system Is this a Tele or Face-to-Face Assessment?: Tele Assessment Is this an Initial Assessment or a Re-assessment for this encounter?: Initial Assessment Patient Accompanied by:: N/A Language Other than English: No Living Arrangements: Other (Comment) What gender do you identify as?: Male Marital status: Single Pregnancy Status: No Living Arrangements: Alone Can pt return to current living arrangement?: Yes Admission Status: Voluntary Is patient capable of signing voluntary admission?: Yes Referral Source: Self/Family/Friend Insurance type: none     Crisis  Care Plan Living Arrangements: Alone [facility]  Education Status Is patient currently in school?: No Is the patient employed, unemployed or receiving disability?: Unemployed  Risk to self with the past 6 months Suicidal Ideation: No Has patient been a risk to self within the past 6 months prior to admission? : No Suicidal Intent: No Has patient had any  suicidal intent within the past 6 months prior to admission? : No Is patient at risk for suicide?: No Suicidal Plan?: No Has patient had any suicidal plan within the past 6 months prior to admission? : No Access to Means: No What has been your use of drugs/alcohol within the last 12 months?: denies recent use Previous Attempts/Gestures: No Triggers for Past Attempts: None known Intentional Self Injurious Behavior: None Family Suicide History: No Recent stressful life event(s): Other (Comment)(psychosis) Persecutory voices/beliefs?: Yes Depression: No Substance abuse history and/or treatment for substance abuse?: Yes(per chart review) Suicide prevention information given to non-admitted patients: Not applicable  Risk to Others within the past 6 months Homicidal Ideation: No Does patient have any lifetime risk of violence toward others beyond the six months prior to admission? : No Thoughts of Harm to Others: No Current Homicidal Intent: No Current Homicidal Plan: No Access to Homicidal Means: No History of harm to others?: No Assessment of Violence: None Noted Does patient have access to weapons?: No Criminal Charges Pending?: No Does patient have a court date: No Is patient on probation?: No  Psychosis Hallucinations: Auditory, With command Delusions: None noted  Mental Status Report Appearance/Hygiene: Unremarkable Eye Contact: Good Motor Activity: Freedom of movement Speech: Logical/coherent Level of Consciousness: Alert Mood: Euthymic, Pleasant Affect: Appropriate to circumstance Anxiety Level: None Thought Processes: Relevant, Coherent Judgement: Partial Orientation: Person, Place, Time, Situation, Appropriate for developmental age Obsessive Compulsive Thoughts/Behaviors: None  Cognitive Functioning Concentration: Normal Memory: Recent Intact, Remote Intact Is patient IDD: No Insight: Fair Impulse Control: Good Appetite: Good Have you had any weight changes?  : No Change Sleep: No Change Total Hours of Sleep: 8 Vegetative Symptoms: None  ADLScreening [facility] Assessment Services) Patient's cognitive ability adequate to safely complete daily activities?: Yes Patient able to express need for assistance with ADLs?: Yes Independently performs ADLs?: Yes (appropriate for developmental age)  Prior Inpatient Therapy Prior Inpatient Therapy: Yes Prior Therapy Dates: 2019, 2018 [facility] Reason for Treatment: MH issues  Prior Outpatient Therapy Prior Outpatient Therapy: Yes Prior Therapy Dates: Ongoing Prior Therapy Facilty/Provider(s): [facility] Reason for Treatment: Med mang Does patient have an ACCT team?: No Does patient have Intensive In-House Services?  : No Does patient have [facility] services? : Yes Does patient have P4CC services?: No  ADL Screening (condition at time of admission) Patient's cognitive ability adequate to safely complete daily activities?: Yes Is the patient deaf or have difficulty hearing?: No Does the patient have difficulty seeing, even when wearing glasses/contacts?: No Does the patient have difficulty concentrating, remembering, or making decisions?: No Patient able to express need for assistance with ADLs?: Yes Does the patient have difficulty dressing or bathing?: No Independently performs ADLs?: Yes (appropriate for developmental age) Does the patient have difficulty walking or climbing stairs?: No Weakness of Legs: None Weakness of Arms/Hands: None  Home Assistive Devices/Equipment Home Assistive Devices/Equipment: None    Abuse/Neglect Assessment (Assessment to be complete while patient is alone) Abuse/Neglect Assessment Can Be Completed: Yes Physical Abuse: Denies Verbal Abuse: Denies Sexual Abuse: Denies Exploitation of patient/patient's resources: Denies Self-Neglect:  Denies     Advance Directives (For Healthcare) Does Patient Have a Medical Advance Directive?:  No Would patient like information on creating a medical advance directive?: No - Patient declined          Disposition: TTS consulted with [name], NP who states the pt does not meet inpt criteria. Pt is recommended to follow up with his current OPT provider in order to receive medication management. EDP [name], MD and pt's nurse Letha Cape, RN have been advised.   Disposition Initial Assessment Completed for this Encounter: Yes Disposition of Patient: Discharge Patient refused recommended treatment: No Mode of transportation if patient is discharged/movement?: Walking Patient referred to: Outpatient clinic referral(f/u with current provider, [name])  This service was provided via telemedicine using a 2-way, interactive audio and video technology.  Names of all persons participating in this telemedicine service and their role in this encounter. Name: Antonios Tintle Role: Patient  Name: Princess Bruins Role: TTS          [name] 03/17/2019 6:03 AM

## 2019-03-17 NOTE — Discharge Instructions (Addendum)
It was our pleasure to provide your ER care today - we hope that you feel better.  Take your medication as prescribed.  Avoid alcohol use.   Follow up with primary care doctor in the coming week.  Also follow up with therapist/mental health in the coming week.   Return to ER if worse, new symptoms, other concern.

## 2019-03-17 NOTE — Progress Notes (Signed)
TTS consulted with [name], NP who states the pt does not meet inpt criteria. Pt is recommended to follow up with his current OPT provider in order to receive medication management. EDP [name], MD and pt's nurse Letha Cape, RN have been advised.   Princess Bruins, MSW, LCSW Therapeutic Triage Specialist  [phone]

## 2019-03-17 NOTE — Care Management (Signed)
Patient is noted to have 34 ED visits in the past 6 months this patient is known to this ED CM. Patient usually presents with c/o of SI, AH, with request for medication refills off shifts when CM/CSW are not onsite.  This patient is homeless, and has been referred to [facility] and [facility] patient has not followed up. ED CM will contact the  [facility] to assist him with medication management and homeless needs.

## 2019-03-17 NOTE — ED Notes (Signed)
Patient alert. Denies pain or hallucinations. Denies SI/HI. Arsenio Loader, MEd, RN CEN 03/17/2019 10:26 AM

## 2019-03-17 NOTE — ED Provider Notes (Signed)
Patient alert, content, nad.   BH team indicates pt psychiatrically clear for d/c.   Pharmacy/CM was consulted to assist with pt getting his home meds.  Vitals:   03/17/19 0520 03/17/19 0915  BP: 138/85 116/67  Pulse: 75 78  Resp: 18 18  Temp: 97.8 F (36.6 C) 98.6 F (37 C)  SpO2: 96% 98%   Pt currently appears stable for d/c.      Cathren Laine, MD 03/17/19 [phone]

## 2019-03-19 ENCOUNTER — Other Ambulatory Visit: Payer: Self-pay

## 2019-03-19 ENCOUNTER — Encounter (HOSPITAL_COMMUNITY): Payer: Self-pay

## 2019-03-19 ENCOUNTER — Emergency Department (HOSPITAL_COMMUNITY)
Admission: EM | Admit: 2019-03-19 | Discharge: 2019-03-19 | Disposition: A | Discharge status: Home / Self Care | Payer: Self-pay | Attending: Emergency Medicine | Admitting: Emergency Medicine

## 2019-03-19 DIAGNOSIS — R440 Auditory hallucinations: Secondary | ICD-10-CM | POA: Insufficient documentation

## 2019-03-19 DIAGNOSIS — Z5900 Homelessness unspecified: Secondary | ICD-10-CM

## 2019-03-19 DIAGNOSIS — F22 Delusional disorders: Secondary | ICD-10-CM | POA: Insufficient documentation

## 2019-03-19 DIAGNOSIS — Z9114 Patient's other noncompliance with medication regimen: Secondary | ICD-10-CM | POA: Insufficient documentation

## 2019-03-19 DIAGNOSIS — F17210 Nicotine dependence, cigarettes, uncomplicated: Secondary | ICD-10-CM | POA: Insufficient documentation

## 2019-03-19 MED ORDER — RISPERIDONE 2 MG PO TABS
3.0000 mg | ORAL_TABLET | Freq: Once | ORAL | Status: AC
  Administered 2019-03-19: 3 mg via ORAL
  Filled 2019-03-19: qty 1

## 2019-03-19 NOTE — ED Triage Notes (Signed)
Pt arrived stating he has been unable to get any of his medication, stating he could not go to [place]. Pt endorses hearing voices.

## 2019-03-19 NOTE — ED Provider Notes (Signed)
[facility]-EMERGENCY DEPT Provider Note   CSN: [id number] Arrival date & time: 03/19/19  0242    History   Chief Complaint Chief Complaint  Patient presents with  . Medication Refill    HPI Cole Barrett is a 33 y.o. male with a history of homelessness, paranoid behavior, and schizophrenia on Risperdal who presents to the emergency department with a chief complaint of "I cannot get my prescriptions refilled."  The patient reports that he has not had a dose of his home Risperdal since he was seen in the ER on 4/19.  Reports he attempted to follow-up with [facility], but they have been closed due to COVID-19.  He reports worsening auditory hallucinations.  He denies SI, HI, or visual hallucinations.  He has no other complaints at this time.  He is requesting a dose of his home Risperdal.     The history is provided by the patient. No language interpreter was used.    Past Medical History:  Diagnosis Date  . Homelessness   . Hypercholesterolemia   . Mental disorder   . Paranoid behavior (HCC)   . PE (pulmonary embolism)   . Schizophrenia [facility](HCC)     Patient Active Problem List   Diagnosis Date Noted  . Schizoaffective disorder, bipolar type (HCC) 01/19/2019  . Alcohol abuse   . Alcohol abuse with alcohol-induced mood disorder (HCC) 09/08/2016  . Homelessness 09/03/2016  . Person feigning illness 09/03/2016  . 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 Cannabis use disorder, mild, abuse 08/29/2016  . Pulmonary emboli (HCC) 07/13/2016  . Adjustment disorder with emotional disturbance 03/12/2014  . 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 Acute left flank pain 01/02/2014  . Pulmonary embolism, bilateral (HCC) 01/02/2014    History reviewed. No pertinent surgical history.      Home Medications    Prior to Admission medications   Medication Sig Start Date End Date Taking? Authorizing Provider  risperiDONE (RISPERDAL) 1 MG  tablet Take 1 tablet (1 mg total) by mouth daily. Take 1 mg every morning 03/13/19   Dione BoozeGlick, David, MD  risperiDONE (RISPERDAL) 3 MG tablet Take 1 tablet (3 mg total) by mouth at bedtime. 03/13/19   Dione BoozeGlick, David, MD    Family History Family History  Problem Relation Age of Onset  . Hypertension Mother     Social History Social History   Tobacco Use  . Smoking status: Current Every Day Smoker    Packs/day: 0.50    Types: Cigarettes  . Smokeless tobacco: Never Used  Substance Use Topics  . Alcohol use: Yes    Comment: BAC 84  . Drug use: Yes    Types: Marijuana    Comment: UDS not available at time of AX     Allergies   Haldol [haloperidol]   Review of Systems Review of Systems  Constitutional: Negative for activity change.  Respiratory: Negative for shortness of breath.   Cardiovascular: Negative for chest pain.  Gastrointestinal: Negative for abdominal pain.  Musculoskeletal: Negative for back pain.  Skin: Negative for rash.  Psychiatric/Behavioral: Positive for hallucinations. Negative for behavioral problems and suicidal ideas. The patient is not nervous/anxious.    Physical Exam Updated Vital Signs BP 139/82 (BP Location: Right Arm)   Pulse 93   Temp 98.7 F (37.1 C) (Oral)   Resp 15   Ht 5\' 10"  (1.778 m)   Wt 106.6 kg   SpO2 98%   BMI 33.72 kg/m   Physical Exam Vitals signs  and nursing note reviewed.  Constitutional:      General: He is not in acute distress.    Appearance: He is well-developed. He is not ill-appearing, toxic-appearing or diaphoretic.     Comments: Disheveled. NAD.   HENT:     Head: Normocephalic.  Eyes:     Conjunctiva/sclera: Conjunctivae normal.  Neck:     Musculoskeletal: Neck supple.  Cardiovascular:     Rate and Rhythm: Normal rate and regular rhythm.     Heart sounds: No murmur.  Pulmonary:     Effort: Pulmonary effort is normal.  Abdominal:     Palpations: Abdomen is soft.  Skin:    General: Skin is warm and dry.   Neurological:     Mental Status: He is alert.  Psychiatric:        Mood and Affect: Mood normal.        Behavior: Behavior normal. Behavior is cooperative.        Thought Content: Thought content does not include homicidal or suicidal ideation. Thought content does not include homicidal or suicidal plan.      ED Treatments / Results  Labs (all labs ordered are listed, but only abnormal results are displayed) Labs Reviewed - No data to display  EKG None  Radiology No results found.  Procedures Procedures (including critical care time)  Medications Ordered in ED Medications  risperiDONE (RISPERDAL) tablet 3 mg (has no administration in time range)     Initial Impression / Assessment and Plan / ED Course  I have reviewed the triage vital signs and the nursing notes.  Pertinent labs & imaging results that were available during my care of the patient were reviewed by me and considered in my medical decision making (see chart for details).        33 year old male with a history of homelessness, paranoid behavior, and schizophrenia who is well-known to this emergency department with 35 visits in the last 6 months.  The patient is here tonight requesting a prescription of his home Risperdal.  This is his usual complaint when he is seen in the ER.  He has been evaluated with his psychiatry team numerous times.  He is having no SI, HI, or visual hallucinations tonight.  He has no other complaints.  He is hemodynamically stable and in no acute distress.  Social work note from 03/17/2019 has been reviewed where social work notes that the patient comes to the ER frequently for medication refills when social work and case management are not onsite.  He has been referred to the [facility] and [facility], but patient has not followed up.  Case manager has contacted the [facility] to assist him with medication management and homeless needs.  The patient will be given a dose of his home Risperdal in the ER.  I  have advised him to follow-up with the [facility].  He states " I do not know what the interactive resource center is. Can they help with medications.  Where is it located? When are they open?"  Given that the patient has no acute complaints at this time, will discharge the patient with follow-up to the [facility] as per CM.   Final Clinical Impressions(s) / ED Diagnoses   Final diagnoses:  Auditory hallucinations  Homelessness    ED Discharge Orders    None       Barkley Boards, PA-C 03/19/19 0316    Shon Baton, MD 03/19/19 [phone]

## 2019-03-19 NOTE — Discharge Instructions (Signed)
Thank you for allowing me to care for you today in the Emergency Department.   Please follow-up with Cole Barrett please see at the interactive resource center to get your medications refilled.  The social worker from the ER has already spoken with the [facility] to ensure that you can get your medications refilled.  The address is listed above.  Return for new or worsening symptoms.

## 2019-03-30 ENCOUNTER — Encounter (HOSPITAL_COMMUNITY): Payer: Self-pay

## 2019-03-30 ENCOUNTER — Emergency Department (HOSPITAL_COMMUNITY)
Admission: EM | Admit: 2019-03-30 | Discharge: 2019-03-30 | Disposition: A | Discharge status: Home / Self Care | Payer: Self-pay | Attending: Emergency Medicine | Admitting: Emergency Medicine

## 2019-03-30 ENCOUNTER — Other Ambulatory Visit: Payer: Self-pay

## 2019-03-30 DIAGNOSIS — Z760 Encounter for issue of repeat prescription: Secondary | ICD-10-CM | POA: Insufficient documentation

## 2019-03-30 DIAGNOSIS — Z590 Homelessness: Secondary | ICD-10-CM | POA: Insufficient documentation

## 2019-03-30 DIAGNOSIS — F250 Schizoaffective disorder, bipolar type: Secondary | ICD-10-CM | POA: Insufficient documentation

## 2019-03-30 DIAGNOSIS — F17210 Nicotine dependence, cigarettes, uncomplicated: Secondary | ICD-10-CM | POA: Insufficient documentation

## 2019-03-30 MED ORDER — RISPERIDONE 1 MG PO TABS
1.0000 mg | ORAL_TABLET | ORAL | Status: AC
  Administered 2019-03-30: 1 mg via ORAL
  Filled 2019-03-30: qty 1

## 2019-03-30 NOTE — ED Triage Notes (Signed)
Pt arrived to ED with c/o Auditory Hallucinations; Pt states the voices are telling him they are going to kill him; Pt denies SI/HI; Pt a&ox 4 on arrival. Pt denies any other complaints or concerns-Monique,RN

## 2019-03-30 NOTE — ED Provider Notes (Signed)
[name] EMERGENCY DEPARTMENT Provider Note   CSN: [id number] Arrival date & time: 03/30/19  0326    History   Chief Complaint Chief Complaint  Patient presents with  . Hallucinations    HPI Cole Barrett is a 33 y.o. male.     The history is provided by the patient.  Illness  Location:  At home  Quality:  Chronic mental health issues has not picked up his prescribed medication and wants a dose  Severity:  Mild Onset quality:  Gradual Timing:  Constant Progression:  Unchanged Chronicity:  Chronic Context:  Schizophrenia and non compliance Relieved by:  Nothing Worsened by:  Nothing Ineffective treatments:  None he is not taking his prescribed meds Associated symptoms: no abdominal pain, no chest pain, no congestion, no cough, no diarrhea, no ear pain, no fatigue, no fever, no headaches, no loss of consciousness, no myalgias, no nausea, no rash, no rhinorrhea, no shortness of breath, no sore throat, no vomiting and no wheezing     Past Medical History:  Diagnosis Date  . Homelessness   . Hypercholesterolemia   . Mental disorder   . Paranoid behavior (HCC)   . PE (pulmonary embolism)   . Schizophrenia [facility])     Patient Active Problem List   Diagnosis Date Noted  . Schizoaffective disorder, bipolar type (HCC) 01/19/2019  . Alcohol abuse   . Alcohol abuse with alcohol-induced mood disorder (HCC) 09/08/2016  . Homelessness 09/03/2016  . Person feigning illness 09/03/2016  . 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 Cannabis use disorder, mild, abuse 08/29/2016  . Pulmonary emboli (HCC) 07/13/2016  . Adjustment disorder with emotional disturbance 03/12/2014  . 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 Acute left flank pain 01/02/2014  . Pulmonary embolism, bilateral (HCC) 01/02/2014    History reviewed. No pertinent surgical history.      Home Medications    Prior to Admission medications   Medication Sig  Start Date End Date Taking? Authorizing Provider  risperiDONE (RISPERDAL) 1 MG tablet Take 1 tablet (1 mg total) by mouth daily. Take 1 mg every morning 03/13/19   [name], MD  risperiDONE (RISPERDAL) 3 MG tablet Take 1 tablet (3 mg total) by mouth at bedtime. 03/13/19   [name], MD    Family History Family History  Problem Relation Age of Onset  . Hypertension Mother     Social History Social History   Tobacco Use  . Smoking status: Current Every Day Smoker    Packs/day: 0.50    Types: Cigarettes  . Smokeless tobacco: Never Used  Substance Use Topics  . Alcohol use: Yes    Comment: BAC 84  . Drug use: Yes    Types: Marijuana    Comment: UDS not available at time of AX     Allergies   Haldol [haloperidol]   Review of Systems Review of Systems  Constitutional: Negative for fatigue and fever.  HENT: Negative for congestion, ear pain, rhinorrhea and sore throat.   Respiratory: Negative for cough, shortness of breath and wheezing.   Cardiovascular: Negative for chest pain.  Gastrointestinal: Negative for abdominal pain, diarrhea, nausea and vomiting.  Musculoskeletal: Negative for myalgias.  Skin: Negative for rash.  Neurological: Negative for loss of consciousness and headaches.  Psychiatric/Behavioral: Negative for self-injury and suicidal ideas. The patient is not nervous/anxious.   All other systems reviewed and are negative.    Physical Exam Updated Vital Signs BP 127/69 (BP Location: Right Arm)  Pulse 91   Temp 98.3 F (36.8 C) (Oral)   Resp 20   SpO2 97%   Physical Exam Vitals signs and nursing note reviewed.  Constitutional:      Appearance: He is normal weight.  HENT:     Head: Normocephalic.     Nose: Nose normal.  Eyes:     Conjunctiva/sclera: Conjunctivae normal.     Pupils: Pupils are equal, round, and reactive to light.  Neck:     Musculoskeletal: Normal range of motion and neck supple.  Cardiovascular:     Rate and Rhythm:  Normal rate and regular rhythm.     Pulses: Normal pulses.     Heart sounds: Normal heart sounds.  Pulmonary:     Effort: Pulmonary effort is normal.     Breath sounds: Normal breath sounds.  Abdominal:     General: Abdomen is flat. Bowel sounds are normal.     Tenderness: There is no abdominal tenderness. There is no guarding or rebound.  Musculoskeletal: Normal range of motion.  Skin:    General: Skin is warm and dry.     Capillary Refill: Capillary refill takes less than 2 seconds.  Neurological:     General: No focal deficit present.     Mental Status: He is alert and oriented to person, place, and time.  Psychiatric:        Mood and Affect: Mood normal.        Behavior: Behavior normal.      ED Treatments / Results  Labs (all labs ordered are listed, but only abnormal results are displayed) Labs Reviewed - No data to display  EKG None  Radiology No results found.  Procedures Procedures (including critical care time)  Medications Ordered in ED Medications  risperiDONE (RISPERDAL) tablet 1 mg (has no administration in time range)     [facility] RX [facility] Final Clinical Impressions(s) / ED Diagnoses   Final diagnoses:  Encounter for medication refill    Return for intractable cough, coughing up blood,fevers >100.4 unrelieved by medication, shortness of breath, intractable vomiting, chest pain, shortness of breath, weakness,numbness, changes in speech, facial asymmetry,abdominal pain, passing out,Inability to tolerate liquids or food, cough, altered mental status or any concerns. No signs of systemic illness or infection. The patient is nontoxic-appearing on exam and vital signs are within normal limits.   I have reviewed the triage vital signs and the nursing notes. Pertinent labs &imaging results that were available during my care of the patient were reviewed by me and considered in my medical decision making (see chart for  details).  After history, exam, and medical workup I feel the patient has been appropriately medically screened and is safe for discharge home. Pertinent diagnoses were discussed with the patient. Patient was given return precautions.     Lashonta Pilling, MD 03/30/19 [phone]

## 2019-04-07 ENCOUNTER — Emergency Department (HOSPITAL_COMMUNITY)
Admission: EM | Admit: 2019-04-07 | Discharge: 2019-04-08 | Disposition: A | Discharge status: Home / Self Care | Payer: Self-pay | Attending: Emergency Medicine | Admitting: Emergency Medicine

## 2019-04-07 ENCOUNTER — Encounter (HOSPITAL_COMMUNITY): Payer: Self-pay | Admitting: Emergency Medicine

## 2019-04-07 ENCOUNTER — Other Ambulatory Visit: Payer: Self-pay

## 2019-04-07 DIAGNOSIS — R440 Auditory hallucinations: Secondary | ICD-10-CM | POA: Insufficient documentation

## 2019-04-07 DIAGNOSIS — Z79899 Other long term (current) drug therapy: Secondary | ICD-10-CM | POA: Insufficient documentation

## 2019-04-07 DIAGNOSIS — F209 Schizophrenia, unspecified: Secondary | ICD-10-CM | POA: Insufficient documentation

## 2019-04-07 DIAGNOSIS — F17210 Nicotine dependence, cigarettes, uncomplicated: Secondary | ICD-10-CM | POA: Insufficient documentation

## 2019-04-07 MED ORDER — RISPERIDONE 1 MG PO TABS
1.0000 mg | ORAL_TABLET | Freq: Once | ORAL | Status: AC
  Administered 2019-04-08: 1 mg via ORAL
  Filled 2019-04-07: qty 1

## 2019-04-07 NOTE — Discharge Instructions (Signed)
Thank you for allowing me to care for you today in the Emergency Department.   Follow-up with [facility].  Take your home medications as prescribed.  Return to the emergency department if you develop thoughts of wanting to hurt or kill yourself, others, visual hallucinations, high fevers, or other new, concerning symptoms.

## 2019-04-07 NOTE — Progress Notes (Signed)
Patient ID: Cole Barrett, male   DOB: Dec 01, 1985, 33 y.o.   MRN: [id number]  Patient evaluated via telephone. He is well known to behavioral health services. Patient is alert and oriented x 4. States that he would like to be admitted to behavioral health for a few days. Endorses auditory hallucinations that tell him that they are going to kill him. The AH are unchanged from previous assessments. Patient denies SI, HI and, visual hallucinations. Patient does not meet inpatient criteria.

## 2019-04-07 NOTE — ED Triage Notes (Signed)
Pt reporting auditory hallucinations. Pt currently denies any SI/HI.

## 2019-04-07 NOTE — ED Provider Notes (Signed)
[facility]-EMERGENCY DEPT Provider Note   CSN: [id number] Arrival date & time: 04/07/19  2158    History   Chief Complaint Chief Complaint  Patient presents with  . Hallucinations    HPI Cole Barrett is a 33 y.o. male with a history of homelessness, hypercholesterolemia, paranoid behavior, PE, schizophrenia, alcohol use disorder who presents to the emergency department with a chief complaint of auditory hallucinations.  The patient reports that he is a longstanding history of auditory hallucinations, but came to the ER for further evaluation after the hallucinations are worsened today.  He reports that his hearing voices telling him to kill himself.  States that the voices became significantly louder tonight.  He denies visual hallucinations.  The voices are not telling him to hurt other people.  At this time, he denies SI or HI.   He reports he has been noncompliant with his home Risperdal.  He cannot recall the last dose that he took.  He denies other complaints including fever, chills, shortness of breath, cough, nausea, vomiting, diarrhea, abdominal pain, numbness, or weakness.   On entry into the room, the patient is sleeping and appears drowsy. Arouses easily to voice. Reports he last slept earlier today, but was unable to rest well due to the voices.  He denies recent alcohol use.  Denies illicit or IV drug use.  He is a current, every day tobacco smoker.      The history is provided by the patient. No language interpreter was used.    Past Medical History:  Diagnosis Date  . Homelessness   . Hypercholesterolemia   . Mental disorder   . Paranoid behavior (HCC)   . PE (pulmonary embolism)   . Schizophrenia [facility](HCC)     Patient Active Problem List   Diagnosis Date Noted  . Schizoaffective disorder, bipolar type (HCC) 01/19/2019  . Alcohol abuse   . Alcohol abuse with alcohol-induced mood disorder (HCC) 09/08/2016  . Homelessness  09/03/2016  . Person feigning illness 09/03/2016  . 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 Cannabis use disorder, mild, abuse 08/29/2016  . Pulmonary emboli (HCC) 07/13/2016  . Adjustment disorder with emotional disturbance 03/12/2014  . 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 Acute left flank pain 01/02/2014  . Pulmonary embolism, bilateral (HCC) 01/02/2014    History reviewed. No pertinent surgical history.      Home Medications    Prior to Admission medications   Medication Sig Start Date End Date Taking? Authorizing Provider  risperiDONE (RISPERDAL) 1 MG tablet Take 1 tablet (1 mg total) by mouth daily. Take 1 mg every morning 03/13/19  Yes Dione BoozeGlick, David, MD  risperiDONE (RISPERDAL) 3 MG tablet Take 1 tablet (3 mg total) by mouth at bedtime. 03/13/19  Yes Dione BoozeGlick, David, MD    Family History Family History  Problem Relation Age of Onset  . Hypertension Mother     Social History Social History   Tobacco Use  . Smoking status: Current Every Day Smoker    Packs/day: 0.50    Types: Cigarettes  . Smokeless tobacco: Never Used  Substance Use Topics  . Alcohol use: Yes    Comment: BAC 84  . Drug use: Yes    Types: Marijuana    Comment: UDS not available at time of AX     Allergies   Haldol [haloperidol]   Review of Systems Review of Systems  Constitutional: Negative for appetite change and fever.  Respiratory: Negative for shortness of breath.  Cardiovascular: Negative for chest pain.  Gastrointestinal: Negative for abdominal pain, diarrhea, nausea and vomiting.  Genitourinary: Negative for dysuria.  Musculoskeletal: Negative for back pain, myalgias, neck pain and neck stiffness.  Skin: Negative for rash.  Allergic/Immunologic: Negative for immunocompromised state.  Neurological: Negative for headaches.  Psychiatric/Behavioral: Positive for hallucinations and sleep disturbance. Negative for confusion, self-injury and suicidal ideas.      Physical Exam Updated Vital Signs BP 124/77 (BP Location: Left Arm)   Pulse 88   Temp 98.1 F (36.7 C) (Oral)   Resp 18   Ht  (1.778 m)   Wt 106 kg   SpO2 100%   BMI 33.53 kg/m   Physical Exam Vitals signs and nursing note reviewed.  Constitutional:      Appearance: He is well-developed.     Comments: Disheveled.   HENT:     Head: Normocephalic.  Eyes:     Conjunctiva/sclera: Conjunctivae normal.  Neck:     Musculoskeletal: Neck supple.  Cardiovascular:     Rate and Rhythm: Normal rate and regular rhythm.     Heart sounds: No murmur.  Pulmonary:     Effort: Pulmonary effort is normal.  Abdominal:     General: There is no distension.     Palpations: Abdomen is soft.  Skin:    General: Skin is warm and dry.  Neurological:     Mental Status: He is alert.     Comments: Patient is drowsy, but easily arousable to voice.  Answers questions in complete, fluent sentences.  Follows simple commands.  Moves all 4 extremities.  No slurred speech.  Psychiatric:        Attention and Perception: He perceives auditory hallucinations. He does not perceive visual hallucinations.        Mood and Affect: Affect is flat.        Behavior: Behavior is withdrawn.        Thought Content: Thought content does not include suicidal ideation. Thought content does not include homicidal or suicidal plan.      ED Treatments / Results  Labs (all labs ordered are listed, but only abnormal results are displayed) Labs Reviewed - No data to display  EKG None  Radiology No results found.  Procedures Procedures (including critical care time)  Medications Ordered in ED Medications  risperiDONE (RISPERDAL) tablet 1 mg (has no administration in time range)     Initial Impression / Assessment and Plan / ED Course  I have reviewed the triage vital signs and the nursing notes.  Pertinent labs & imaging results that were available during my care of the patient were reviewed by me and  considered in my medical decision making (see chart for details).        33 year old male with a history of homelessness, hypercholesterolemia, paranoid behavior, PE, schizophrenia, alcohol use disorder who is well-known to this emergency department with 35 visits in the last 6 months.  The patient is typically here for a medication refill of his home Risperdal.  Today, he is here for worsening auditory hallucinations over the last 24 hours.  He has been noncompliant with his home Risperdal.  He has not followed up with [facility].  He denies SI, HI, or visual hallucinations at this time.  He has no other medical complaints.  Will defer labs at this time as the patient has normal vital signs and no complaints.  On exam, the patient was minimally drowsy, but easily arousable to voice.  He was ambulatory  without an ataxic gait.  Speech was not slurred.  Questions were answered appropriately.  The patient does suffer from homelessness and I suspect he has poor sleep hygiene.  He does not appear to be intoxicated.  Consulted TTS who recommends discharging the patient and having him follow-up with [facility].  I will give him a dose of his Risperdal, but will give him the 1 mg dose as opposed to the 3 mg dose at nighttime since he already appears mildly drowsy.  He is alert and oriented.  At this time, I feel that the patient is hemodynamically stable and in no acute distress.  Safe for discharge with outpatient follow-up at this time.  Final Clinical Impressions(s) / ED Diagnoses   Final diagnoses:  Auditory hallucinations    ED Discharge Orders    None       Barkley Boards, PA-C 04/07/19 2339    Derwood Kaplan, MD 04/08/19 [phone]

## 2019-04-07 NOTE — BH Assessment (Addendum)
Tele Assessment Note   Patient Name: [facility] MRN: [id number] Referring Physician: Frederik PearMia McDonald, PA-C Location of Patient: [facility], [phone] Location of Provider: Behavioral Health TTS Department  Cole HawkingChristopher Adam Rendell is an 33 y.o. single male who presents unaccompanied to [facility] reporting he is hearing voices telling him they are going to kill him. Pt has a history of schizophrenia and has presented to area EDs 22 times in 2020 reporting the similar symptoms. TTS tele-cart is not working and assessment was conducted via telephone. Pt says the voices were more intense today. He denies visual hallucinations. He describes his mood as "okay." He denies depressive symptoms. He denies problems with sleep or appetite. He denies current suicidal ideation or history of suicide attempts. Pt denies any history of intentional self-injurious behaviors. Pt denies current homicidal ideation or history of violence. Pt denies any history of auditory or visual hallucinations. Pt reports he uses alcohol and marijuana regularly (see below). He denies other substance use.  Pt identifies his mental health symptoms as his primary stressor. He says he is unemployed and lives alone. He says he has two brother who are his primary support. He denies history of abuse or trauma. He denies legal problems. He denies access to firearms.  Pt states he does not have current outpatient mental health providers. He has been outpatient with [facility] in the past. He says he is not currently taking any psychiatric medications. He says it has been at least two years since he was psychiatrically hospitalized. Pt's medical record [facility]  Pt is dressed in hospital scrubs, alert and oriented x4. Pt speaks in a clear tone, at moderate volume and normal pace. Pt's mood is eithymic and affect is congruent with mood. Thought process is coherent and relevant. Pt was pleasant  and cooperative throughout assessment. He says he wants to be observed in the [facility] for a couple of days or admitted to a psychiatric facility.   Diagnosis: F20.9 Schizophrenia  Past Medical History:  Past Medical History:  Diagnosis Date  . Homelessness   . Hypercholesterolemia   . Mental disorder   . Paranoid behavior (HCC)   . PE (pulmonary embolism)   . Schizophrenia (HCC)     History reviewed. No pertinent surgical history.  Family History:  Family History  Problem Relation Age of Onset  . Hypertension Mother     Social History:  reports that he has been smoking cigarettes. He has been smoking about 0.50 packs per day. He has never used smokeless tobacco. He reports current alcohol use. He reports current drug use. Drug: Marijuana.  Additional Social History:  Alcohol / Drug Use Pain Medications: See MAR Prescriptions: See MAR Over the Counter: See MAR History of alcohol / drug use?: Yes Longest period of sobriety (when/how Barrett): 2 months Negative Consequences of Use: (Pt denies) Withdrawal Symptoms: (Pt denies) Substance #1 Name of Substance 1: Alcohol 1 - Age of First Use: adolescent 1 - Amount (size/oz): varies 1 - Frequency: Daily when available 1 - Duration: ongoing 1 - Last Use / Amount: 04/07/19 Substance #2 Name of Substance 2: Marijuana 2 - Age of First Use: 16 2 - Amount (size/oz): 1 joint 2 - Frequency: Daily when available 2 - Duration: ongoing 2 - Last Use / Amount: 04/07/19  CIWA: CIWA-Ar BP: 124/77 Pulse Rate: 88 COWS:    Allergies:  Allergies  Allergen Reactions  . Haldol [Haloperidol] Shortness Of Breath  Home Medications: (Not in a hospital admission)   OB/GYN Status:  No LMP for male patient.  General Assessment Data Location of Assessment: [facility] TTS Assessment: In system Is this a Tele or Face-to-Face Assessment?: Tele Assessment Is this an Initial Assessment or a Re-assessment for this encounter?: Initial  Assessment Patient Accompanied by:: N/A Language Other than English: No Living Arrangements: Other (Comment)(Lives alone) What gender do you identify as?: Male Marital status: Single Maiden name: NA Pregnancy Status: No Living Arrangements: Alone Can pt return to current living arrangement?: Yes Admission Status: Voluntary Is patient capable of signing voluntary admission?: Yes Referral Source: Self/Family/Friend Insurance type: Self-pay     Crisis Care Plan Living Arrangements: Alone Legal Guardian: Other:(Self) Name of Psychiatrist: Pt denies Name of Therapist: Pt denies  Education Status Is patient currently in school?: No Is the patient employed, unemployed or receiving disability?: Unemployed  Risk to self with the past 6 months Suicidal Ideation: No Has patient been a risk to self within the past 6 months prior to admission? : No Suicidal Intent: No Has patient had any suicidal intent within the past 6 months prior to admission? : No Is patient at risk for suicide?: No Suicidal Plan?: No Has patient had any suicidal plan within the past 6 months prior to admission? : No Access to Means: No What has been your use of drugs/alcohol within the last 12 months?: Pt reports using alcohol and marijuana Previous Attempts/Gestures: No How many times?: 0 Other Self Harm Risks: None Triggers for Past Attempts: None known Intentional Self Injurious Behavior: None Family Suicide History: No Recent stressful life event(s): Other (Comment)(Ongoing auditory hallucinations) Persecutory voices/beliefs?: Yes Depression: No Depression Symptoms: (Pt denies) Substance abuse history and/or treatment for substance abuse?: Yes Suicide prevention information given to non-admitted patients: Not applicable  Risk to Others within the past 6 months Homicidal Ideation: No Does patient have any lifetime risk of violence toward others beyond the six months prior to admission? : No Thoughts  of Harm to Others: No Current Homicidal Intent: No Current Homicidal Plan: No Access to Homicidal Means: No Identified Victim: None History of harm to others?: No Assessment of Violence: None Noted Violent Behavior Description: Pt denies history of violence Does patient have access to weapons?: No Criminal Charges Pending?: No Does patient have a court date: No Is patient on probation?: No  Psychosis Hallucinations: Auditory(Reports hearing voices telling him they are going to kill hi) Delusions: None noted  Mental Status Report Appearance/Hygiene: Unremarkable Eye Contact: Unable to Assess Motor Activity: Unable to assess Speech: Logical/coherent Level of Consciousness: Alert Mood: Euthymic, Pleasant Affect: Appropriate to circumstance Anxiety Level: None Thought Processes: Coherent, Relevant Judgement: Partial Orientation: Person, Place, Time, Situation Obsessive Compulsive Thoughts/Behaviors: None  Cognitive Functioning Concentration: Fair Memory: Recent Intact, Remote Intact Is patient IDD: No Insight: Fair Impulse Control: Good Appetite: Good Have you had any weight changes? : No Change Sleep: No Change Total Hours of Sleep: 8 Vegetative Symptoms: None  ADLScreening [facility] Assessment Services) Patient's cognitive ability adequate to safely complete daily activities?: Yes Patient able to express need for assistance with ADLs?: Yes Independently performs ADLs?: Yes (appropriate for developmental age)  Prior Inpatient Therapy Prior Inpatient Therapy: Yes Prior Therapy Dates: 2019, 2018 Prior Therapy Facilty/Provider(s): [facility], [facility] Reason for Treatment: MH issues  Prior Outpatient Therapy Prior Outpatient Therapy: Yes Prior Therapy Dates: 2019 Prior Therapy Facilty/Provider(s): [facility] Reason for Treatment: Med mang Does patient have an ACCT team?: No Does patient have Intensive In-House  Services?  : No Does patient have [facility] services? : No Does patient  have P4CC services?: No  ADL Screening (condition at time of admission) Patient's cognitive ability adequate to safely complete daily activities?: Yes Is the patient deaf or have difficulty hearing?: No Does the patient have difficulty seeing, even when wearing glasses/contacts?: No Does the patient have difficulty concentrating, remembering, or making decisions?: No Patient able to express need for assistance with ADLs?: Yes Does the patient have difficulty dressing or bathing?: No Independently performs ADLs?: Yes (appropriate for developmental age) Does the patient have difficulty walking or climbing stairs?: No Weakness of Legs: None Weakness of Arms/Hands: None  Home Assistive Devices/Equipment Home Assistive Devices/Equipment: None    Abuse/Neglect Assessment (Assessment to be complete while patient is alone) Abuse/Neglect Assessment Can Be Completed: Yes Physical Abuse: Denies Verbal Abuse: Denies Sexual Abuse: Denies Exploitation of patient/patient's resources: Denies Self-Neglect: Denies     Merchant navy officer (For Healthcare) Does Patient Have a Medical Advance Directive?: No Would patient like information on creating a medical advance directive?: No - Patient declined          Disposition: Gave clinical report to [name], FNP who spoke with Pt and agreed Pt does not meet criteria for inpatient psychiatric treatment. Recommendation is Pt follow up with [facility] for outpatient mental health services. [name], FNP notified Dr. Juleen China. TTS notified Cassandra, RN of recommendation.  Disposition Initial Assessment Completed for this Encounter: Yes Patient referred to: [facility]  This service was provided via telemedicine using a 2-way, interactive audio and video technology.  Names of all persons participating in this telemedicine service and their role in this encounter. Name: [name] Role: Patient  Name: Shela Commons, [facility] Role: TTS counselor          Harlin Rain Patsy [place], [facility], [facility], [facility] Triage Specialist [phone]  Pamalee Leyden 04/07/2019 11:10 PM

## 2019-04-09 ENCOUNTER — Other Ambulatory Visit: Payer: Self-pay

## 2019-04-09 ENCOUNTER — Emergency Department (HOSPITAL_COMMUNITY)
Admission: EM | Admit: 2019-04-09 | Discharge: 2019-04-09 | Disposition: A | Discharge status: Home / Self Care | Payer: Self-pay | Attending: Emergency Medicine | Admitting: Emergency Medicine

## 2019-04-09 DIAGNOSIS — Z9114 Patient's other noncompliance with medication regimen: Secondary | ICD-10-CM | POA: Insufficient documentation

## 2019-04-09 DIAGNOSIS — F17210 Nicotine dependence, cigarettes, uncomplicated: Secondary | ICD-10-CM | POA: Insufficient documentation

## 2019-04-09 DIAGNOSIS — R440 Auditory hallucinations: Secondary | ICD-10-CM | POA: Insufficient documentation

## 2019-04-09 MED ORDER — RISPERIDONE 3 MG PO TABS
3.0000 mg | ORAL_TABLET | Freq: Once | ORAL | Status: AC
  Administered 2019-04-09: 06:00:00 3 mg via ORAL
  Filled 2019-04-09: qty 1

## 2019-04-09 MED ORDER — RISPERIDONE 1 MG PO TABS: 1.0000 mg | ORAL_TABLET | Freq: Every day | ORAL | 0 refills | Status: DC

## 2019-04-09 MED ORDER — RISPERIDONE 3 MG PO TABS: 3.0000 mg | ORAL_TABLET | Freq: Every day | ORAL | 0 refills | Status: DC

## 2019-04-09 NOTE — ED Provider Notes (Signed)
[name] EMERGENCY DEPARTMENT Provider Note   CSN: [id number] Arrival date & time: 04/09/19  0454    History   Chief Complaint Chief Complaint  Patient presents with  . Psychiatric Evaluation    HPI [facility] is a 33 y.o. male.     HPI  This is a 33 year old male well-known to our emergency department with a history of schizophrenia, auditory hallucinations, homelessness who presents with auditory hallucinations.  Patient reports that he woke up tonight and "my hallucinations were bad."  He reports voices telling him that they will kill him.  He denies any homicidal or suicidal ideation.  He has had similar visits for the same in the past.  He is currently out of his Risperdal and has not been taking it for over 1 week.  He has had 36 ER visits in the last 6 months most recently on Sunday.  At that time he was evaluated by behavioral health and recommended to follow-up with [facility] as an outpatient.  He has not called [facility] but is due to follow-up with them later today.  He denies any physical complaints including chest pain, abdominal pain, nausea, vomiting.  Past Medical History:  Diagnosis Date  . Homelessness   . Hypercholesterolemia   . Mental disorder   . Paranoid behavior (HCC)   . PE (pulmonary embolism)   . Schizophrenia [facility])     Patient Active Problem List   Diagnosis Date Noted  . Schizoaffective disorder, bipolar type (HCC) 01/19/2019  . Alcohol abuse   . Alcohol abuse with alcohol-induced mood disorder (HCC) 09/08/2016  . Homelessness 09/03/2016  . Person feigning illness 09/03/2016  . 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 Cannabis use disorder, mild, abuse 08/29/2016  . Pulmonary emboli (HCC) 07/13/2016  . Adjustment disorder with emotional disturbance 03/12/2014  . 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 Acute left flank pain 01/02/2014  . Pulmonary embolism, bilateral (HCC) 01/02/2014    No  past surgical history on file.      Home Medications    Prior to Admission medications   Medication Sig Start Date End Date Taking? Authorizing Provider  risperiDONE (RISPERDAL) 1 MG tablet Take 1 tablet (1 mg total) by mouth daily. Take 1 mg every morning 04/09/19   Horton, Mayer Masker, MD  risperiDONE (RISPERDAL) 3 MG tablet Take 1 tablet (3 mg total) by mouth at bedtime. 04/09/19   Horton, Mayer Masker, MD    Family History Family History  Problem Relation Age of Onset  . Hypertension Mother     Social History Social History   Tobacco Use  . Smoking status: Current Every Day Smoker    Packs/day: 0.50    Types: Cigarettes  . Smokeless tobacco: Never Used  Substance Use Topics  . Alcohol use: Yes    Comment: BAC 84  . Drug use: Yes    Types: Marijuana    Comment: UDS not available at time of AX     Allergies   Haldol [haloperidol]   Review of Systems Review of Systems  Constitutional: Negative for fever.  Respiratory: Negative for shortness of breath.   Cardiovascular: Negative for chest pain.  Gastrointestinal: Negative for abdominal pain, nausea and vomiting.  Psychiatric/Behavioral: Positive for hallucinations. Negative for self-injury and suicidal ideas.  All other systems reviewed and are negative.    Physical Exam Updated Vital Signs BP (!) 148/82 (BP Location: Right Arm)   Pulse (!) 106   Temp 98.1 F (36.7  C) (Oral)   Resp 20   Ht 1.778 m (5\' 10" )   Wt 106 kg   SpO2 100%   BMI 33.53 kg/m   Physical Exam Vitals signs and nursing note reviewed.  Constitutional:      Appearance: He is well-developed.     Comments: Disheveled appearing but nontoxic  HENT:     Head: Normocephalic and atraumatic.  Cardiovascular:     Rate and Rhythm: Normal rate and regular rhythm.  Pulmonary:     Effort: Pulmonary effort is normal. No respiratory distress.  Musculoskeletal:     Right lower leg: No edema.     Left lower leg: No edema.  Skin:    General:  Skin is warm and dry.  Neurological:     Mental Status: He is alert and oriented to person, place, and time.  Psychiatric:     Comments: Slightly pressured speech      ED Treatments / Results  Labs (all labs ordered are listed, but only abnormal results are displayed) Labs Reviewed - No data to display  EKG None  Radiology No results found.  Procedures Procedures (including critical care time)  Medications Ordered in ED Medications  risperiDONE (RISPERDAL) tablet 3 mg (has no administration in time range)     Initial Impression / Assessment and Plan / ED Course  I have reviewed the triage vital signs and the nursing notes.  Pertinent labs & imaging results that were available during my care of the patient were reviewed by me and considered in my medical decision making (see chart for details).        Patient with history of schizophrenia presents with auditory hallucinations.  He appears to be at his baseline.  Multiple ED evaluations for the same including 2 days ago.  He is not homicidal or suicidal.  He has no physical complaints.  He was given 1 dose of his nighttime respirdal.  I will refill his prescription.  He reports that he is supposed to call [facility] later today.  I have encouraged him to do so.  I do not feel he needs further work-up at this time.  After history, exam, and medical workup I feel the patient has been appropriately medically screened and is safe for discharge home. Pertinent diagnoses were discussed with the patient. Patient was given return precautions.   Final Clinical Impressions(s) / ED Diagnoses   Final diagnoses:  Auditory hallucinations    ED Discharge Orders         Ordered    risperiDONE (RISPERDAL) 1 MG tablet  Daily     04/09/19 0521    risperiDONE (RISPERDAL) 3 MG tablet  Daily at bedtime     04/09/19 0521           Shon BatonHorton, Courtney F, MD 04/09/19 [phone]

## 2019-04-09 NOTE — Discharge Instructions (Signed)
You were seen today for auditory hallucinations.  You will be given a prescription for your respite all.  It is very important that you follow-up with [facility] as planned for ongoing psychiatric needs.

## 2019-04-09 NOTE — ED Notes (Signed)
Pt denies SI/HI and was seen 2 days ago for same, states he is out of his Riperadone for evenings (3mg )

## 2019-04-09 NOTE — ED Triage Notes (Signed)
Hearing voices telling him they are going to kill him.

## 2019-04-16 ENCOUNTER — Other Ambulatory Visit: Payer: Self-pay

## 2019-04-16 ENCOUNTER — Encounter (HOSPITAL_COMMUNITY): Payer: Self-pay | Admitting: Family Medicine

## 2019-04-16 ENCOUNTER — Emergency Department (HOSPITAL_COMMUNITY)
Admission: EM | Admit: 2019-04-16 | Discharge: 2019-04-17 | Disposition: A | Discharge status: Home / Self Care | Payer: Self-pay | Attending: Emergency Medicine | Admitting: Emergency Medicine

## 2019-04-16 DIAGNOSIS — R440 Auditory hallucinations: Secondary | ICD-10-CM

## 2019-04-16 DIAGNOSIS — F209 Schizophrenia, unspecified: Secondary | ICD-10-CM | POA: Insufficient documentation

## 2019-04-16 DIAGNOSIS — F22 Delusional disorders: Secondary | ICD-10-CM | POA: Insufficient documentation

## 2019-04-16 DIAGNOSIS — Z590 Homelessness: Secondary | ICD-10-CM | POA: Insufficient documentation

## 2019-04-16 DIAGNOSIS — R443 Hallucinations, unspecified: Secondary | ICD-10-CM

## 2019-04-16 DIAGNOSIS — F17210 Nicotine dependence, cigarettes, uncomplicated: Secondary | ICD-10-CM | POA: Insufficient documentation

## 2019-04-16 DIAGNOSIS — F250 Schizoaffective disorder, bipolar type: Secondary | ICD-10-CM | POA: Diagnosis present

## 2019-04-16 LAB — RAPID URINE DRUG SCREEN, HOSP PERFORMED
Amphetamines: NOT DETECTED
Barbiturates: NOT DETECTED
Benzodiazepines: NOT DETECTED
Cocaine: NOT DETECTED
Opiates: NOT DETECTED
Tetrahydrocannabinol: NOT DETECTED

## 2019-04-16 MED ORDER — RISPERIDONE 2 MG PO TABS
3.0000 mg | ORAL_TABLET | Freq: Every day | ORAL | Status: DC
  Administered 2019-04-16: 3 mg via ORAL
  Filled 2019-04-16: qty 1

## 2019-04-16 NOTE — ED Notes (Signed)
Admitted to room 30 from ED for ongoing monitoring for safety and for further assessment of needs. He reports auditory hallucinations, denies any thoughts to hurt self or others.  He reports being med compliant. He has a hx of A/H and last admitted to [facility] about one year ago. He reports alcohol use of one or two beers last night and denies any drug use. He is cooperative. Gave him a snack and settled in watching TV

## 2019-04-16 NOTE — ED Notes (Signed)
Admitted to room 30 from ED

## 2019-04-16 NOTE — ED Triage Notes (Signed)
Patient states about 45 minutes ago he started having auditory hallucinations. He denies feeling suicidal or homicidal.

## 2019-04-16 NOTE — ED Provider Notes (Signed)
[facility]-EMERGENCY DEPT Provider Note   CSN: [id number] Arrival date & time: 04/16/19  2004    History   Chief Complaint Chief Complaint  Patient presents with  . Hallucinations    HPI Cole Barrett is a 33 y.o. male.     The history is provided by the patient and medical records. No language interpreter was used.  Mental Health Problem  Presenting symptoms: hallucinations and paranoid behavior   Presenting symptoms: no aggressive behavior, no agitation, no depression, no suicidal thoughts, no suicidal threats and no suicide attempt   Degree of incapacity (severity):  Moderate Onset quality:  Gradual Duration:  1 day Timing:  Intermittent Progression:  Worsening Chronicity:  Recurrent Context: alcohol use and noncompliance   Context: not drug abuse, not recent medication change and not stressful life event   Treatment compliance:  Most of the time Time since last psychoactive medication taken:  1 day Worsened by:  Nothing Ineffective treatments:  None tried Associated symptoms: no abdominal pain, no anxiety, no chest pain, no fatigue, no feelings of worthlessness and no headaches   Risk factors: hx of mental illness     Past Medical History:  Diagnosis Date  . Homelessness   . Hypercholesterolemia   . Mental disorder   . Paranoid behavior (HCC)   . PE (pulmonary embolism)   . Schizophrenia [facility](HCC)     Patient Active Problem List   Diagnosis Date Noted  . Schizoaffective disorder, bipolar type (HCC) 01/19/2019  . Alcohol abuse   . Alcohol abuse with alcohol-induced mood disorder (HCC) 09/08/2016  . Homelessness 09/03/2016  . Person feigning illness 09/03/2016  . 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 Cannabis use disorder, mild, abuse 08/29/2016  . Pulmonary emboli (HCC) 07/13/2016  . Adjustment disorder with emotional disturbance 03/12/2014  . 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 Acute left flank pain  01/02/2014  . Pulmonary embolism, bilateral (HCC) 01/02/2014    History reviewed. No pertinent surgical history.      Home Medications    Prior to Admission medications   Medication Sig Start Date End Date Taking? Authorizing Provider  risperiDONE (RISPERDAL) 1 MG tablet Take 1 tablet (1 mg total) by mouth daily. Take 1 mg every morning 04/09/19   Horton, Mayer Maskerourtney F, MD  risperiDONE (RISPERDAL) 3 MG tablet Take 1 tablet (3 mg total) by mouth at bedtime. 04/09/19   Horton, Mayer Maskerourtney F, MD    Family History Family History  Problem Relation Age of Onset  . Hypertension Mother     Social History Social History   Tobacco Use  . Smoking status: Current Every Day Smoker    Packs/day: 0.50    Types: Cigarettes  . Smokeless tobacco: Never Used  Substance Use Topics  . Alcohol use: Yes    Comment: 1 x a week. Last drink: last night   . Drug use: Yes    Types: Marijuana    Comment: Last used: 2 months ago      Allergies   Haldol [haloperidol]   Review of Systems Review of Systems  Constitutional: Negative for chills, diaphoresis, fatigue and fever.  HENT: Negative for congestion.   Eyes: Negative for visual disturbance.  Respiratory: Negative for cough, chest tightness, shortness of breath and wheezing.   Cardiovascular: Negative for chest pain, palpitations and leg swelling.  Gastrointestinal: Negative for abdominal pain, constipation, diarrhea, nausea and vomiting.  Genitourinary: Positive for frequency. Negative for dysuria and flank pain.  Musculoskeletal: Negative for  back pain, neck pain and neck stiffness.  Skin: Negative for rash and wound.  Neurological: Negative for light-headedness and headaches.  Psychiatric/Behavioral: Positive for hallucinations and paranoia. Negative for agitation and suicidal ideas. The patient is not nervous/anxious.   All other systems reviewed and are negative.    Physical Exam Updated Vital Signs BP 136/83 (BP Location: Left Arm)    Pulse 97   Temp 98.3 F (36.8 C) (Oral)   Resp 20   Ht 5\' 10"  (1.778 m)   Wt 106 kg   SpO2 98%   BMI 33.53 kg/m   Physical Exam Vitals signs and nursing note reviewed.  Constitutional:      General: He is not in acute distress.    Appearance: He is well-developed. He is not ill-appearing, toxic-appearing or diaphoretic.  HENT:     Head: Normocephalic and atraumatic.     Nose: No congestion or rhinorrhea.     Mouth/Throat:     Pharynx: No oropharyngeal exudate or posterior oropharyngeal erythema.  Eyes:     Conjunctiva/sclera: Conjunctivae normal.     Pupils: Pupils are equal, round, and reactive to light.  Neck:     Musculoskeletal: Neck supple. No muscular tenderness.  Cardiovascular:     Rate and Rhythm: Normal rate and regular rhythm.     Heart sounds: No murmur.  Pulmonary:     Effort: Pulmonary effort is normal. No respiratory distress.     Breath sounds: Normal breath sounds.  Abdominal:     Palpations: Abdomen is soft.     Tenderness: There is no abdominal tenderness. There is no right CVA tenderness or left CVA tenderness.  Musculoskeletal:        General: No tenderness.  Skin:    General: Skin is warm and dry.     Capillary Refill: Capillary refill takes less than 2 seconds.  Neurological:     General: No focal deficit present.     Mental Status: He is alert.  Psychiatric:        Attention and Perception: He perceives auditory hallucinations. He does not perceive visual hallucinations.        Mood and Affect: Mood is not anxious.        Speech: He is communicative.        Behavior: Behavior normal.        Thought Content: Thought content is paranoid.      ED Treatments / Results  Labs (all labs ordered are listed, but only abnormal results are displayed) Labs Reviewed  COMPREHENSIVE METABOLIC PANEL  ETHANOL  CBC  RAPID URINE DRUG SCREEN, HOSP PERFORMED    EKG None  Radiology No results found.  Procedures Procedures (including critical  care time)  Medications Ordered in ED Medications  risperiDONE (RISPERDAL) tablet 3 mg (has no administration in time range)     Initial Impression / Assessment and Plan / ED Course  I have reviewed the triage vital signs and the nursing notes.  Pertinent labs & imaging results that were available during my care of the patient were reviewed by me and considered in my medical decision making (see chart for details).        Cole Barrett is a 33 y.o. male with a past medical history significant for schizophrenia, homelessness, and prior hallucinations who presents with worsening hallucinations.  He reports that his symptoms began again today and he is hearing voices telling him they are going to kill him.  He is very fearful and does  not feel safe outside of the hospital at this time.  He denies any SI or HI.  He reports that he has been taking his Risperdal during the daytime but has not had his nighttime prescription.  He denies any drug use but does report that he has been drinking some alcohol yesterday.  He denies any alcohol today.  He reports he drank 240s of beer yesterday.  He denies any other symptoms including no fevers, chills, chest pain, shortness breath, nausea vomiting, urinary symptoms or GI symptoms.  He otherwise is resting comfortably and has no complaints.  On exam, lungs are clear and chest is nontender.  Abdomen is nontender.  Patient moving all extremities.  Patient is pleasant.  Patient continues to report that he is hearing voices telling him there are to kill him which have worsened today.  He reports that he feels similar to how he was when he had to be admitted last year.  Chart review shows the patient has been to the emergency department hospital numerous times recently.  Given his lack of physical complaints a low suspicion for other concerning etiologies of his symptoms.  Do not feel he needs extensive laboratory and imaging work-up at this time however  I do think that TTS recommendation and evaluation is appropriate given his recurrent worsened hallucinations.  In case they feel he needs placement, will order the screening labs.  I do not feel he needs IVC at this time.  I will order his home 3 mg dose of nighttime Risperdal and consult TTS.  Awaiting TTS recommendations.   Final Clinical Impressions(s) / ED Diagnoses   Final diagnoses:  Auditory hallucination  Hallucinations  Paranoia (HCC)    Clinical Impression: 1. Auditory hallucination   2. Hallucinations   3. Paranoia (HCC)     Disposition: Awaiting TTS recommendations.   This note was prepared with assistance of Conservation officer, historic buildings. Occasional wrong-word or sound-a-like substitutions may have occurred due to the inherent limitations of voice recognition software.      Aniceto Kyser, Canary Brim, MD 04/16/19 2238

## 2019-04-16 NOTE — ED Notes (Signed)
Drew blood: 1 of the following- purple tube, light green tube, dark green tube, blue tube, red tube, and yellow tube. Patient states he is unable to urinate at this time but will get urine when he is able too.

## 2019-04-17 ENCOUNTER — Encounter (HOSPITAL_COMMUNITY): Payer: Self-pay | Admitting: Registered Nurse

## 2019-04-17 MED ORDER — RISPERIDONE 3 MG PO TABS: 3.0000 mg | ORAL_TABLET | Freq: Every day | ORAL | 0 refills | Status: DC

## 2019-04-17 NOTE — ED Notes (Signed)
Pt DCd off the unit to home per MD. Pt alert, calm, cooperative, no s/s of distress. Pt DC information reviewed and given to pt with acknowledged understanding.pt belongings given belongings.  Pt ambulatory off unit.  Pt using bus for transportation, per pt.

## 2019-04-17 NOTE — [facility] Suicide Risk Assessment (Signed)
[facility]   Principal Problem: Auditory hallucinations Discharge Diagnoses: Principal Problem:   Auditory hallucinations Active Problems:   Schizoaffective disorder, bipolar type (HCC)   Total Time spent with patient: 30 minutes  Musculoskeletal: Strength & Muscle Tone: No atrophy noted. Gait & Station: UTA since patient is lying in bed. Patient leans: N/A  Cole Barrett reports worsening of chronic AH due to medication noncompliance. He reports running out of his nighttime Risperdal. Today [facility] after receiving Risperdal overnight and getting some rest. [name] and take his prescription at discharge to have [facility]. He denies SI or HI.   Psychiatric Specialty Exam: Review of Systems  Psychiatric/Behavioral: Positive for hallucinations (AH, chronic and at baseline.). Negative for suicidal ideas.  All other systems reviewed and are negative.   Blood pressure 136/83, pulse 97, temperature 98.3 F (36.8 C), temperature source Oral, resp. rate 20, height 5\' 10"  (1.778 m), weight 106 kg, SpO2 98 %.Body mass index is 33.53 kg/m.  General Appearance: Fairly Groomed, young, African American male, wearing casual clothes with hair in dreads who is sitting in bed. NAD.   Eye Contact::  Good  Speech:  Clear and Coherent and Normal Rate  Volume:  Normal  Mood:  Euthymic  Affect:  Constricted  Thought Process:  Goal Directed, Linear and Descriptions of Associations: Intact  Orientation:  Full (Time, Place, and Person)  Thought Content:  Logical and Hallucinations: Auditory Chronic and at baseline.  Suicidal Thoughts:  No  Homicidal Thoughts:  No  Memory:  Immediate;   Fair Recent;   Fair Remote;   Poor  Judgement:  Fair  Insight:  Fair  Psychomotor Activity:  Normal  Concentration:  Good  Recall:  Good  Fund of Knowledge:Good  Language: Good  Akathisia:  No  Handed:  Right  AIMS (if  indicated):   N/A  Assets:  Communication Skills Desire for Improvement Resilience  Sleep:     Cognition: WNL  ADL's:  Intact   Mental Status Per Nursing Assessment::   On Admission:   "Admitted to room 30 from ED for ongoing monitoring for safety and for further assessment of needs. He reports auditory hallucinations, denies any thoughts to hurt self or others.  He reports being med compliant. He has a hx of A/H and [facility]. He reports alcohol use of one or two beers last night and denies any drug use. He is cooperative. Gave him a snack and settled in watching TV."  Demographic Factors:  Male, Low socioeconomic status, Living alone and Unemployed  Loss Factors: Financial problems/change in socioeconomic status  Historical Factors: NA  Risk Reduction Factors:   Positive therapeutic relationship  Continued Clinical Symptoms:  More than one psychiatric diagnosis Previous Psychiatric Diagnoses and Treatments  Cognitive Features That Contribute To Risk:  None    Suicide Risk:  Minimal: No identifiable suicidal ideation.  Patients presenting with no risk factors but with morbid ruminations; may be classified as minimal risk based on the severity of the depressive symptoms  Assessment:  Cole Barrett is a 33 y.o. male who was admitted with AH in the setting of medication noncompliance. He received Risperdal overnight and reports an improvement in chronic AH today. He denies SI or HI. He is able to safety plan. He does not warrant inpatient psychiatric hospitalization at this time.    Plan Of Care/Follow-up recommendations:  -  Continue psychotropic medication regimen. -Follow up with [name] for medication management.   Cherly Beach, DO 04/17/2019, 11:21 AM

## 2019-04-17 NOTE — Progress Notes (Signed)
Received [place] in his room asleep in bed, no distress noted at the present time. He continued to sleep throughout the night.

## 2019-04-17 NOTE — BH Assessment (Signed)
[facility] Assessment Progress Note  Per Juanetta Beets, DO, this pt does not require psychiatric hospitalization at this time.  Pt is to be discharged from [facility] with recommendation to return to [facility] for outpatient services.  This has been included in pt's discharge instructions.  Pt's nurse, Waynetta Sandy, has been notified.  Doylene Canning, MA Triage Specialist [phone]

## 2019-04-17 NOTE — Discharge Instructions (Signed)
For your mental health needs, you are advised to return to [facility].  Call them at your earliest opportunity to schedule an appointment:       [facility]      [address]      [place], [place] [address]      [phone]      Crisis number: [phone]

## 2019-04-17 NOTE — ED Notes (Addendum)
Cole Sievert, PA, recommends hold until patients BAL is completed to ensure safety and stabilization as patient reported alcohol consumption. Once psych cleared patient recommended to follow up with [facility] for medication management. Cole Knee, RN, and Selena Batten [facility], informed of disposition.

## 2019-04-17 NOTE — BH Assessment (Signed)
Tele Assessment Note   Patient Name: Cole Barrett MRN: [id number] Referring Physician: Dr. Cristal Deerhristopher Tegeler [facility] Location of Provider: Behavioral Health TTS Department  Cole Barrett is an 33 y.o. male presenting with auditory hallucinations. Patient is voluntary. Patient reports worsening auditory hallucinations "voices telling me they are going to kill me". Patient reported voices last throughout entire day and denied command voices. Patient denied SI and HI. Patient reported he [facility] be [facility] so that he can be monitored and have his medication refilled. Patient [facility]. However patient reported he has been taking his morning medications, but not his night time medications because he ran out. Patient denied past suicide attempts and self harming behaviors. Patient reported sleep and appetite normal. Patient denied access to firearms or weapons. Patient reported alcohol usage of 1-2 beers, but unable to tell how much alcohol consumed with clinician. Patient admitted to using alcohol and marijuana regularly.  Patient identifies his mental health symptoms as his primary stressor. Patient reported that he is unemployed and lives alone. Patient denies history of abuse or trauma. Patient denies legal problems. Patient was calm and cooperative during assessment.  UDS negative ETOH pending  Collateral Contact: Patient declined.  Diagnosis: Schizophrenia  Past Medical History:  Past Medical History:  Diagnosis Date  . Homelessness   . Hypercholesterolemia   . Mental disorder   . Paranoid behavior (HCC)   . PE (pulmonary embolism)   . Schizophrenia (HCC)     History reviewed. No pertinent surgical history.  Family History:  Family History  Problem Relation Age of Onset  . Hypertension Mother     Social History:  reports that he has been smoking  cigarettes. He has been smoking about 0.50 packs per day. He has never used smokeless tobacco. He reports current alcohol use. He reports current drug use. Drug: Marijuana.  Additional Social History:  Alcohol / Drug Use Pain Medications: see MAR Prescriptions: see MAR Over the Counter: see MAR  CIWA: CIWA-Ar BP: 136/83 Pulse Rate: 97 Nausea and Vomiting: no nausea and no vomiting Tactile Disturbances: none Tremor: no tremor Auditory Disturbances: not present Paroxysmal Sweats: no sweat visible Visual Disturbances: not present Anxiety: no anxiety, at ease Headache, Fullness in Head: none present Agitation: normal activity Orientation and Clouding of Sensorium: oriented and can do serial additions CIWA-Ar Total: 0 COWS: Clinical Opiate Withdrawal Scale (COWS) Resting Pulse Rate: Pulse Rate 81-100 Sweating: No report of chills or flushing Restlessness: Able to sit still Pupil Size: Pupils pinned or normal size for room light Bone or Joint Aches: Not present Runny Nose or Tearing: Not present GI Upset: No GI symptoms Tremor: No tremor Yawning: No yawning Anxiety or Irritability: None Gooseflesh Skin: Skin is smooth COWS Total Score: 1  Allergies:  Allergies  Allergen Reactions  . Haldol [Haloperidol] Shortness Of Breath    Home Medications: (Not in a hospital admission)   OB/GYN Status:  No LMP for male patient.  General Assessment Data Location of Assessment: [facility] ED TTS Assessment: In system Is this a Tele or Face-to-Face Assessment?: Tele Assessment Is this an Initial Assessment or a Re-assessment for this encounter?: Initial Assessment Patient Accompanied by:: N/A Language Other than English: No Living Arrangements: (home alone) What gender do you identify as?: Male Marital status: Single Living Arrangements: Alone Can pt return to current living arrangement?: Yes Admission Status: Voluntary Is patient capable of signing voluntary  admission?: Yes Referral  Source: Self/Family/Friend     Crisis Care Plan Living Arrangements: Alone Legal Guardian: (self) Name of Psychiatrist: Pt denies Name of Therapist: Pt denies  Education Status Is patient currently in school?: No Is the patient employed, unemployed or receiving disability?: Unemployed  Risk to self with the past 6 months Suicidal Ideation: No Has patient been a risk to self within the past 6 months prior to admission? : No Suicidal Intent: No Has patient had any suicidal intent within the past 6 months prior to admission? : No Is patient at risk for suicide?: No Suicidal Plan?: No Has patient had any suicidal plan within the past 6 months prior to admission? : No Access to Means: No What has been your use of drugs/alcohol within the last 12 months?: (alcohol) Previous Attempts/Gestures: No How many times?: (0) Other Self Harm Risks: (denied) Triggers for Past Attempts: None known Intentional Self Injurious Behavior: None Family Suicide History: No Recent stressful life event(s): (hallucinations) Persecutory voices/beliefs?: No Depression: Yes Depression Symptoms: Fatigue Substance abuse history and/or treatment for substance abuse?: No Suicide prevention information given to non-admitted patients: Not applicable  Risk to Others within the past 6 months Homicidal Ideation: No Does patient have any lifetime risk of violence toward others beyond the six months prior to admission? : No Thoughts of Harm to Others: No Current Homicidal Intent: No Current Homicidal Plan: No Access to Homicidal Means: No Identified Victim: (n/a) History of harm to others?: No Assessment of Violence: None Noted Violent Behavior Description: (n/a) Does patient have access to weapons?: No Criminal Charges Pending?: No Does patient have a court date: No Is patient on probation?: No  Psychosis Hallucinations: None noted Delusions: None noted  Mental Status Report Appearance/Hygiene:  Unremarkable Eye Contact: Poor Motor Activity: Freedom of movement Speech: Logical/coherent Level of Consciousness: Drowsy Affect: Appropriate to circumstance Anxiety Level: Minimal Thought Processes: Coherent Judgement: Partial Orientation: Person, Place, Time, Situation Obsessive Compulsive Thoughts/Behaviors: None  Cognitive Functioning Concentration: Poor Memory: Recent Intact Is patient IDD: No Insight: Poor Impulse Control: Poor Appetite: Good Have you had any weight changes? : No Change Sleep: No Change Total Hours of Sleep: (9) Vegetative Symptoms: None  ADLScreening [facility] Assessment Services) Patient's cognitive ability adequate to safely complete daily activities?: Yes Patient able to express need for assistance with ADLs?: Yes Independently performs ADLs?: Yes (appropriate for developmental age)  Prior Inpatient Therapy Prior Inpatient Therapy: Yes Prior Therapy Dates: 2019, 2018 Prior Therapy Facilty/Provider(s): [facility] Reason for Treatment: MH issues  Prior Outpatient Therapy Prior Outpatient Therapy: Yes Prior Therapy Dates: 2019 Prior Therapy Facilty/Provider(s): [facility] Reason for Treatment: Med mang Does patient have an ACCT team?: No Does patient have Intensive In-House Services?  : No Does patient have [facility] services? : Yes Does patient have P4CC services?: No  ADL Screening (condition at time of admission) Patient's cognitive ability adequate to safely complete daily activities?: Yes Patient able to express need for assistance with ADLs?: Yes Independently performs ADLs?: Yes (appropriate for developmental age)    Merchant navy officer (For Healthcare) Does Patient Have a Medical Advance Directive?: No Would patient like information on creating a medical advance directive?: Yes (ED - Information included in AVS)    Disposition:  Disposition Initial Assessment Completed for this Encounter: Yes  Donell Sievert, PA, recommends hold until  patients BAL is completed to ensure safety and stabilization as patient reported alcohol consumption. Once psych cleared patient recommended to follow up with [facility] for medication management. Fannie Knee, RN,  and Hassie Bruce, informed of disposition.  This service was provided via telemedicine using a 2-way, interactive audio and video technology.  Names of all persons participating in this telemedicine service and their role in this encounter. Name: Loretha Brasil Role: Patient  Name: Al Corpus, [facility] Role: TTS Clinician  Name:  Role:   Name:  Role:     Burnetta Sabin, [facility] 04/17/2019 12:26 AM

## 2019-04-18 ENCOUNTER — Other Ambulatory Visit: Payer: Self-pay

## 2019-04-18 ENCOUNTER — Emergency Department (HOSPITAL_COMMUNITY)
Admission: EM | Admit: 2019-04-18 | Discharge: 2019-04-19 | Disposition: A | Discharge status: Home / Self Care | Payer: Self-pay | Attending: Emergency Medicine | Admitting: Emergency Medicine

## 2019-04-18 ENCOUNTER — Encounter (HOSPITAL_COMMUNITY): Payer: Self-pay | Admitting: Emergency Medicine

## 2019-04-18 DIAGNOSIS — Z79899 Other long term (current) drug therapy: Secondary | ICD-10-CM | POA: Insufficient documentation

## 2019-04-18 DIAGNOSIS — Z590 Homelessness: Secondary | ICD-10-CM | POA: Insufficient documentation

## 2019-04-18 DIAGNOSIS — F17210 Nicotine dependence, cigarettes, uncomplicated: Secondary | ICD-10-CM | POA: Insufficient documentation

## 2019-04-18 DIAGNOSIS — R440 Auditory hallucinations: Secondary | ICD-10-CM | POA: Insufficient documentation

## 2019-04-18 DIAGNOSIS — F209 Schizophrenia, unspecified: Secondary | ICD-10-CM | POA: Insufficient documentation

## 2019-04-18 DIAGNOSIS — F250 Schizoaffective disorder, bipolar type: Secondary | ICD-10-CM | POA: Diagnosis present

## 2019-04-18 LAB — RAPID URINE DRUG SCREEN, HOSP PERFORMED
Amphetamines: NOT DETECTED
Barbiturates: NOT DETECTED
Benzodiazepines: NOT DETECTED
Cocaine: NOT DETECTED
Opiates: POSITIVE — AB
Tetrahydrocannabinol: NOT DETECTED

## 2019-04-18 LAB — CBC WITH DIFFERENTIAL/PLATELET
Abs Immature Granulocytes: 0.05 10*3/uL (ref 0.00–0.07)
Basophils Absolute: 0 10*3/uL (ref 0.0–0.1)
Basophils Relative: 1 %
Eosinophils Absolute: 0.2 10*3/uL (ref 0.0–0.5)
Eosinophils Relative: 4 %
HCT: 41.3 % (ref 39.0–52.0)
Hemoglobin: 13.2 g/dL (ref 13.0–17.0)
Immature Granulocytes: 1 %
Lymphocytes Relative: 36 %
Lymphs Abs: 1.5 10*3/uL (ref 0.7–4.0)
MCH: 27.8 pg (ref 26.0–34.0)
MCHC: 32 g/dL (ref 30.0–36.0)
MCV: 87.1 fL (ref 80.0–100.0)
Monocytes Absolute: 0.5 10*3/uL (ref 0.1–1.0)
Monocytes Relative: 12 %
Neutro Abs: 1.9 10*3/uL (ref 1.7–7.7)
Neutrophils Relative %: 46 %
Platelets: 291 10*3/uL (ref 150–400)
RBC: 4.74 MIL/uL (ref 4.22–5.81)
RDW: 12.8 % (ref 11.5–15.5)
WBC: 4.1 10*3/uL (ref 4.0–10.5)
nRBC: 0 % (ref 0.0–0.2)

## 2019-04-18 LAB — COMPREHENSIVE METABOLIC PANEL
ALT: 36 U/L (ref 0–44)
AST: 24 U/L (ref 15–41)
Albumin: 4 g/dL (ref 3.5–5.0)
Alkaline Phosphatase: 47 U/L (ref 38–126)
Anion gap: 12 (ref 5–15)
BUN: 12 mg/dL (ref 6–20)
CO2: 24 mmol/L (ref 22–32)
Calcium: 9.2 mg/dL (ref 8.9–10.3)
Chloride: 104 mmol/L (ref 98–111)
Creatinine, Ser: 0.96 mg/dL (ref 0.61–1.24)
GFR calc Af Amer: >60 mL/min (ref >60)
GFR calc non Af Amer: >60 mL/min (ref >60)
Glucose, Bld: 122 mg/dL — ABNORMAL HIGH (ref 70–99)
Potassium: 3.7 mmol/L (ref 3.5–5.1)
Sodium: 140 mmol/L (ref 135–145)
Total Bilirubin: 0.5 mg/dL (ref 0.3–1.2)
Total Protein: 7.3 g/dL (ref 6.5–8.1)

## 2019-04-18 LAB — ETHANOL: Alcohol, Ethyl (B): <10 mg/dL (ref <10)

## 2019-04-18 MED ORDER — ACETAMINOPHEN 325 MG PO TABS: 650.0000 mg | ORAL_TABLET | ORAL | Status: DC | PRN

## 2019-04-18 MED ORDER — ALUM & MAG HYDROXIDE-SIMETH 200-200-20 MG/5ML PO SUSP: 30.0000 mL | Freq: Four times a day (QID) | ORAL | Status: DC | PRN

## 2019-04-18 NOTE — BH Assessment (Addendum)
Tele Assessment Note   Patient Name: Cole Barrett MRN: [id number] Referring Physician: PA Cammy Copa Location of Patient: [facility] ED Location of Provider: Behavioral Health TTS Department  Cole Barrett is an 33 y.o. male.  The pt came in due  To psychosis.  He stated he is hearing voices saying that the voices are going to kill him.  The pt denies SI and HI and denies any previous attempts  He stated he is taking his medication.  He also stated he went to [facility] and they were closed.  The pt has been inpatient in 2018.    The pt stated he lives alone and gets along well with his neighbors.  The pt denies self harm, and a history of abuse.  He stated he has a court case for trespassing.  He stated he is sleeping and eating well.  The pt denies SA use.  However, his UDS was positive for opiates.  The pt has had 7 drug screenings in the past 4 months and they were all negative for all substances.  Pt is dressed in scrubs. He is alert and oriented x4. Pt speaks in a clear tone, at moderate volume and normal pace. Eye contact is good. Pt's mood is pleasant. Thought process is coherent and relevant.  Pt was cooperative throughout assessment.    Diagnosis:  F20.9 Schizophrenia  Past Medical History:  Past Medical History:  Diagnosis Date  . Homelessness   . Hypercholesterolemia   . Mental disorder   . Paranoid behavior (HCC)   . PE (pulmonary embolism)   . Schizophrenia (HCC)     History reviewed. No pertinent surgical history.  Family History:  Family History  Problem Relation Age of Onset  . Hypertension Mother     Social History:  reports that he has been smoking cigarettes. He has been smoking about 0.50 packs per day. He has never used smokeless tobacco. He reports current alcohol use. He reports current drug use. Drug: Marijuana.  Additional Social History:  Alcohol / Drug Use Pain Medications: See MAR Prescriptions: See MAR Over the Counter: See MAR History  of alcohol / drug use?: No history of alcohol / drug abuse Longest period of sobriety (when/how long): NA  CIWA: CIWA-Ar BP: 140/86 Pulse Rate: 85 COWS:    Allergies:  Allergies  Allergen Reactions  . Haldol [Haloperidol] Shortness Of Breath    Home Medications: (Not in a hospital admission)   OB/GYN Status:  No LMP for male patient.  General Assessment Data Location of Assessment: [facility] ED TTS Assessment: In system Is this a Tele or Face-to-Face Assessment?: Tele Assessment Is this an Initial Assessment or a Re-assessment for this encounter?: Initial Assessment Patient Accompanied by:: N/A Language Other than English: No Living Arrangements: Other (Comment)(home) What gender do you identify as?: Male Marital status: Single Living Arrangements: Alone Can pt return to current living arrangement?: Yes Admission Status: Voluntary Is patient capable of signing voluntary admission?: Yes Referral Source: Self/Family/Friend Insurance type: Self Pay     Crisis Care Plan Living Arrangements: Alone Legal Guardian: Other:(Self) Name of Psychiatrist: Pt denies Name of Therapist: Pt denies  Education Status Is patient currently in school?: No Is the patient employed, unemployed or receiving disability?: Unemployed  Risk to self with the past 6 months Suicidal Ideation: No Has patient been a risk to self within the past 6 months prior to admission? : No Suicidal Intent: No Has patient had any suicidal intent within the past 6 months prior  to admission? : No Is patient at risk for suicide?: No Suicidal Plan?: No Has patient had any suicidal plan within the past 6 months prior to admission? : No Access to Means: No What has been your use of drugs/alcohol within the last 12 months?: none Previous Attempts/Gestures: No How many times?: 0 Other Self Harm Risks: none Triggers for Past Attempts: None known Intentional Self Injurious Behavior: None Family Suicide History:  No Recent stressful life event(s): Other (Comment)(pt denies) Persecutory voices/beliefs?: No Depression: No Substance abuse history and/or treatment for substance abuse?: No Suicide prevention information given to non-admitted patients: Not applicable  Risk to Others within the past 6 months Homicidal Ideation: No Does patient have any lifetime risk of violence toward others beyond the six months prior to admission? : No Thoughts of Harm to Others: No Current Homicidal Intent: No Current Homicidal Plan: No Access to Homicidal Means: No Identified Victim: Pt denies History of harm to others?: No Assessment of Violence: None Noted Violent Behavior Description: pt denies Does patient have access to weapons?: No Criminal Charges Pending?: No Does patient have a court date: No Is patient on probation?: No  Psychosis Hallucinations: Auditory, Visual Delusions: None noted  Mental Status Report Appearance/Hygiene: Unremarkable Eye Contact: Good Motor Activity: Freedom of movement Speech: Logical/coherent Level of Consciousness: Alert Mood: Pleasant Affect: Appropriate to circumstance Anxiety Level: None Thought Processes: Coherent, Relevant Judgement: Partial Orientation: Place, Person, Time, Situation Obsessive Compulsive Thoughts/Behaviors: None  Cognitive Functioning Concentration: Normal Memory: Recent Intact, Remote Intact Is patient IDD: No Insight: Fair Impulse Control: Poor Appetite: Good Have you had any weight changes? : No Change Sleep: No Change Total Hours of Sleep: 8 Vegetative Symptoms: None  ADLScreening [facility] Assessment Services) Patient's cognitive ability adequate to safely complete daily activities?: Yes Patient able to express need for assistance with ADLs?: Yes Independently performs ADLs?: Yes (appropriate for developmental age)  Prior Inpatient Therapy Prior Inpatient Therapy: Yes Prior Therapy Dates: 2019, 2018 Prior Therapy  Facilty/Provider(s): [facility], [facility] Reason for Treatment: MH issues  Prior Outpatient Therapy Prior Outpatient Therapy: Yes Prior Therapy Dates: 2019 Prior Therapy Facilty/Provider(s): [facility] Reason for Treatment: Med mang Does patient have an ACCT team?: No Does patient have Intensive In-House Services?  : No Does patient have [facility] services? : No Does patient have P4CC services?: Yes  ADL Screening (condition at time of admission) Patient's cognitive ability adequate to safely complete daily activities?: Yes Patient able to express need for assistance with ADLs?: Yes Independently performs ADLs?: Yes (appropriate for developmental age)       Abuse/Neglect Assessment (Assessment to be complete while patient is alone) Abuse/Neglect Assessment Can Be Completed: Yes Physical Abuse: Denies Verbal Abuse: Denies Sexual Abuse: Denies Exploitation of patient/patient's resources: Denies Self-Neglect: Denies Values / Beliefs Cultural Requests During Hospitalization: None Spiritual Requests During Hospitalization: None Consults Spiritual Care Consult Needed: No Social Work Consult Needed: No Merchant navy officer (For Healthcare) Does Patient Have a Medical Advance Directive?: No Would patient like information on creating a medical advance directive?: No - Guardian declined          Disposition:  Disposition Initial Assessment Completed for this Encounter: Yes  PA Donell Sievert recommends the pt be observed overnight for safety and stabilization.  The pt is to be reassessed by psychiatry in the morning.   This service was provided via telemedicine using a 2-way, interactive audio and video technology.  Names of all persons participating in this telemedicine service and their role in this encounter. Name:  Loretha Brasilhristopher Plasencia Role: Pt  Name:  Role:   Name:  Role:   Name:  Role:     Ottis StainGarvin, Jaden Batchelder Jermaine 04/18/2019 8:33 PM

## 2019-04-18 NOTE — ED Notes (Signed)
Dinner tray at bedside

## 2019-04-18 NOTE — Care Management (Signed)
Patient present to [facility] today with c/o auditory hallucinations.  Patient was evaluated yesterday at [facility] ED.  Patient did not meet criteria for inpatient hospitalization. Patient was discharged with [facility] for follow-up. Patient states [name] is closed. TTS pending.

## 2019-04-18 NOTE — ED Notes (Signed)
Patient denies pain and is resting comfortably.  

## 2019-04-18 NOTE — ED Provider Notes (Signed)
[facility] EMERGENCY DEPARTMENT Provider Note   CSN: [id number] Arrival date & time: 04/18/19  1452    History   Chief Complaint Chief Complaint  Patient presents with   Psychiatric Evaluation    HPI Cole Barrett is a 33 y.o. male.  Who presents emergency department with chief complaint of auditory hallucinations.  Patient states that this is been ongoing.  He has frequent visitor to the emergency department.  He is also homeless.  He is taking Risperdal.  Patient complains that he is hearing voices that are telling him to to hurt himself.  He states that he try to follow-up with [facility] but they have been closed.  He denies visual hallucinations, or suicidal/homicidal intent.     HPI  Past Medical History:  Diagnosis Date   Homelessness    Hypercholesterolemia    Mental disorder    Paranoid behavior (HCC)    PE (pulmonary embolism)    Schizophrenia (HCC)     Patient Active Problem List   Diagnosis Date Noted   Schizoaffective disorder, bipolar type (HCC) 01/19/2019   Alcohol abuse    Auditory hallucinations    Alcohol abuse with alcohol-induced mood disorder (HCC) 09/08/2016   Homelessness 09/03/2016   Person feigning illness 09/03/2016   Brief psychotic disorder (HCC) 08/29/2016   Cannabis use disorder, mild, abuse 08/29/2016   Pulmonary emboli (HCC) 07/13/2016   Adjustment disorder with emotional disturbance 03/12/2014   Pulmonary embolism and infarction (HCC) 01/02/2014   Acute left flank pain 01/02/2014   Pulmonary embolism, bilateral (HCC) 01/02/2014    History reviewed. No pertinent surgical history.      Home Medications    Prior to Admission medications   Medication Sig Start Date End Date Taking? Authorizing Provider  risperiDONE (RISPERDAL) 1 MG tablet Take 1 tablet (1 mg total) by mouth daily. Take 1 mg every morning Patient taking differently: Take 1 mg by mouth See admin instructions. Take 1 mg  every morning and then take 1 mg at bedtime with 3 mg tablet for a total of 4 mg 04/09/19   Horton, Mayer Masker, MD  risperiDONE (RISPERDAL) 3 MG tablet Take 1 tablet (3 mg total) by mouth at bedtime. Patient taking differently: Take 3 mg by mouth at bedtime. Take 3 mg with 1 mg tablet at bedtime for a total of 4 mg 04/09/19   Horton, Mayer Masker, MD  risperiDONE (RISPERDAL) 3 MG tablet Take 1 tablet (3 mg total) by mouth at bedtime. Patient not taking: Reported on 04/18/2019 04/17/19   Wynetta Fines, MD    Family History Family History  Problem Relation Age of Onset   Hypertension Mother     Social History Social History   Tobacco Use   Smoking status: Current Every Day Smoker    Packs/day: 0.50    Types: Cigarettes   Smokeless tobacco: Never Used  Substance Use Topics   Alcohol use: Yes    Comment: 1 x a week. Last drink: last night    Drug use: Yes    Types: Marijuana    Comment: Last used: 2 months ago      Allergies   Haldol [haloperidol]   Review of Systems Review of Systems  Ten systems reviewed and are negative for acute change, except as noted in the HPI.   Physical Exam Updated Vital Signs BP 140/86 (BP Location: Right Arm)    Pulse 85    Temp (!) 97.5 F (36.4 C) (Oral)  Resp 16    Ht 5\' 10"  (1.778 m)    Wt 106 kg    SpO2 98%    BMI 33.53 kg/m   Physical Exam Vitals signs and nursing note reviewed.  Constitutional:      General: He is not in acute distress.    Appearance: He is well-developed. He is not diaphoretic.  HENT:     Head: Normocephalic and atraumatic.  Eyes:     General: No scleral icterus.    Conjunctiva/sclera: Conjunctivae normal.  Neck:     Musculoskeletal: Normal range of motion and neck supple.  Cardiovascular:     Rate and Rhythm: Normal rate and regular rhythm.     Heart sounds: Normal heart sounds.  Pulmonary:     Effort: Pulmonary effort is normal. No respiratory distress.     Breath sounds: Normal breath sounds.    Abdominal:     Palpations: Abdomen is soft.     Tenderness: There is no abdominal tenderness.  Skin:    General: Skin is warm and dry.  Neurological:     Mental Status: He is alert.  Psychiatric:        Behavior: Behavior normal.      ED Treatments / Results  Labs (all labs ordered are listed, but only abnormal results are displayed) Labs Reviewed  COMPREHENSIVE METABOLIC PANEL - Abnormal; Notable for the following components:      Result Value   Glucose, Bld 122 (*)    All other components within normal limits  RAPID URINE DRUG SCREEN, HOSP PERFORMED - Abnormal; Notable for the following components:   Opiates POSITIVE (*)    All other components within normal limits  ETHANOL  CBC WITH DIFFERENTIAL/PLATELET    EKG None  Radiology No results found.  Procedures Procedures (including critical care time)  Medications Ordered in ED Medications  acetaminophen (TYLENOL) tablet 650 mg (has no administration in time range)  alum & mag hydroxide-simeth (MAALOX/MYLANTA) 200-200-20 MG/5ML suspension 30 mL (has no administration in time range)     Initial Impression / Assessment and Plan / ED Course  I have reviewed the triage vital signs and the nursing notes.  Pertinent labs & imaging results that were available during my care of the patient were reviewed by me and considered in my medical decision making (see chart for details).        Patient here with auditory hallucinations.  He has been seen by TTS and will be observed until the morning.  I personally reviewed his labs which show mildly elevated blood glucose however patient was just eating when his blood was drawn.  He is otherwise calm.  He is medically clear for psych evaluation.  Final Clinical Impressions(s) / ED Diagnoses   Final diagnoses:  Auditory hallucinations    ED Discharge Orders    None       Arthor CaptainHarris, Airyn Ellzey, PA-C 04/18/19 2328    Jacalyn LefevreHaviland, Julie, MD 04/23/19 [phone]

## 2019-04-18 NOTE — ED Triage Notes (Signed)
Patient presents with complaints of hearing voices stating that they were going to kil him. Recent outpatient referral to closed site. Patients reports increased in voices. Reports needed help.

## 2019-04-18 NOTE — ED Notes (Signed)
Pt arrived to room 51. Belongings brought over with pt and placed in locker 1. Pt calm and cooperative at this time. Will continue to monitor.

## 2019-04-19 ENCOUNTER — Encounter (HOSPITAL_COMMUNITY): Payer: Self-pay | Admitting: Registered Nurse

## 2019-04-19 DIAGNOSIS — F250 Schizoaffective disorder, bipolar type: Secondary | ICD-10-CM

## 2019-04-19 DIAGNOSIS — R440 Auditory hallucinations: Secondary | ICD-10-CM

## 2019-04-19 MED ORDER — RISPERIDONE 3 MG PO TABS: 3.0000 mg | ORAL_TABLET | Freq: Every day | ORAL | Status: DC

## 2019-04-19 MED ORDER — RISPERIDONE 3 MG PO TABS: 3.0000 mg | ORAL_TABLET | Freq: Every day | ORAL | 0 refills | Status: DC

## 2019-04-19 NOTE — ED Notes (Signed)
Pt. showering

## 2019-04-19 NOTE — Discharge Instructions (Addendum)
Please follow up with [facility] regarding the auditory hallucinations you have been experiencing. Please pick up medication at [facility] as well; they are open M-F 9 am - 5 pm.

## 2019-04-19 NOTE — ED Notes (Signed)
Breakfast ordered 

## 2019-04-19 NOTE — ED Notes (Signed)
Breakfast tray ordered 

## 2019-04-19 NOTE — ED Provider Notes (Signed)
Emergency Medicine Observation Re-evaluation Note  Cole Barrett is a 33 y.o. male, seen on rounds today.  Pt initially presented to the ED for complaints of Psychiatric Evaluation Currently, the patient is taking a shower. No overnight events per nursing staff.   Physical Exam  BP 134/76   Pulse 74   Temp 97.8 F (36.6 C) (Oral)   Resp 16   Ht 5\' 10"  (1.778 m)   Wt 106 kg   SpO2 98%   BMI 33.53 kg/m   Physical Exam Constitutional:      Comments: Awake, alert, with even respirations      ED Course / MDM  FWY:[id number]   I have reviewed the labs performed to date as well as medications administered while in observation.  No recent changes in the last 24 hours. Plan  Current plan is for discharge/follow up with [facility] with prescription for Risperdal 3 mg qhs. SW working on finding out how patient can get medications; per [facility] note they will call patient with instructions. Pt discharged home.  Patient is not under full IVC at this time.   Tanda Rockers, PA-C 04/19/19 1732    Gerhard Munch, MD 04/23/19 1416

## 2019-04-19 NOTE — Consult Note (Signed)
Telepsych Consultation   Reason for Consult:  "Voices telling me they are going to kill me" Referring Physician:  Arthor Captain, PA-C Location of Patient: [facility] Location of Provider: [facility]  Patient Identification: Cole Barrett MRN:  [id number] Principal Diagnosis: Schizoaffective disorder, bipolar type (HCC) Diagnosis:  Principal Problem:   Schizoaffective disorder, bipolar type (HCC)   Total Time spent with patient: 45 minutes  Subjective:   Cole Barrett is a 33 y.o. male patient presents to [facility] with complaints of auditory hallucinations with voices telling him they are going to kill him.  HPI:  Patient seen via tele psych by this provider; chart reviewed and consulted with Dr. Lucianne Muss on 04/19/19.  Patient well know to ED; frequent visits with similar complaints of worsening auditory hallucinations due to noncompliance with medications.  Patient usually reports feeling better once he receives Risperdal.  On evaluation Cole Barrett reports he is still hearing voices.  States he was given Risperdal when he was in the hospital "the other day and I was feeling better; but I went to [place] and couldn't get my prescription filled and that voices started getting bad again.  They got real bad yesterday.  They a little better today but still a little bad."  Patient states if he is able to get a dose of his medication he would be good and would try to get prescription filled today.  Patient also states that he was given a drug card once before when he had to get his medication filled at another pharmacy which helped with the cost.  Patient denies suicidal/self-harm/homicidal ideation, and paranoia.  Patient states that he is unsure where the prescription that he was given the other day.  "It might be at my house some where.  Patient informed that would give a prescription to take to the pharmacy today to make sure he was able to get his medication.   Discussed taking and injectable with patient but states that he is not ready for that right now.  SW checking with [name] to see if pharmacy open for prescription pick up.    During evaluation Cole Barrett is sitting on side of bed; he is alert/oriented x 4; calm/cooperative; and mood congruent with affect.  Patient is speaking in a clear tone at moderate volume, and normal pace; with good eye contact.  His thought process is coherent and relevant; There is no indication that he is currently responding to internal/external stimuli or experiencing delusional thought content; although he continues to say he is hearing voices.  Patient denies suicidal/self-harm/homicidal ideation, and paranoia.  Patient has remained calm throughout assessment and has answered questions appropriately.   Past Psychiatric History: Schizoaffective disorder  Risk to Self: Suicidal Ideation: No Suicidal Intent: No Is patient at risk for suicide?: No Suicidal Plan?: No Access to Means: No What has been your use of drugs/alcohol within the last 12 months?: none How many times?: 0 Other Self Harm Risks: none Triggers for Past Attempts: None known Intentional Self Injurious Behavior: None Risk to Others: Homicidal Ideation: No Thoughts of Harm to Others: No Current Homicidal Intent: No Current Homicidal Plan: No Access to Homicidal Means: No Identified Victim: Pt denies History of harm to others?: No Assessment of Violence: None Noted Violent Behavior Description: pt denies Does patient have access to weapons?: No Criminal Charges Pending?: No Does patient have a court date: No Prior Inpatient Therapy: Prior Inpatient Therapy: Yes Prior Therapy Dates: 2019, 2018 Prior Therapy Facilty/Provider(s):  [facility], [facility] Reason for Treatment: MH issues Prior Outpatient Therapy: Prior Outpatient Therapy: Yes Prior Therapy Dates: 2019 Prior Therapy Facilty/Provider(s): [facility] Reason for Treatment: Med mang Does  patient have an ACCT team?: No Does patient have Intensive In-House Services?  : No Does patient have [facility] services? : No Does patient have P4CC services?: Yes  Past Medical History:  Past Medical History:  Diagnosis Date  . Homelessness   . Hypercholesterolemia   . Mental disorder   . Paranoid behavior (HCC)   . PE (pulmonary embolism)   . Schizophrenia (HCC)    History reviewed. No pertinent surgical history. Family History:  Family History  Problem Relation Age of Onset  . Hypertension Mother    Family Psychiatric  History: Unaware Social History:  Social History   Substance and Sexual Activity  Alcohol Use Yes   Comment: 1 x a week. Last drink: last night      Social History   Substance and Sexual Activity  Drug Use Yes  . Types: Marijuana   Comment: Last used: 2 months ago     Social History   Socioeconomic History  . Marital status: Single    Spouse name: Not on file  . Number of children: Not on file  . Years of education: Not on file  . Highest education level: Not on file  Occupational History  . Occupation: Unemployed  Social Needs  . Financial resource strain: Not on file  . Food insecurity:    Worry: Not on file    Inability: Not on file  . Transportation needs:    Medical: Not on file    Non-medical: Not on file  Tobacco Use  . Smoking status: Current Every Day Smoker    Packs/day: 0.50    Types: Cigarettes  . Smokeless tobacco: Never Used  Substance and Sexual Activity  . Alcohol use: Yes    Comment: 1 x a week. Last drink: last night   . Drug use: Yes    Types: Marijuana    Comment: Last used: 2 months ago   . Sexual activity: Never  Lifestyle  . Physical activity:    Days per week: Not on file    Minutes per session: Not on file  . Stress: Not on file  Relationships  . Social connections:    Talks on phone: Not on file    Gets together: Not on file    Attends religious service: Not on file    Active member of club or  organization: Not on file    Attends meetings of clubs or organizations: Not on file    Relationship status: Not on file  Other Topics Concern  . Not on file  Social History Narrative   ** Merged History Encounter **       Additional Social History:    Allergies:   Allergies  Allergen Reactions  . Haldol [Haloperidol] Shortness Of Breath    Labs:  Results for orders placed or performed during the hospital encounter of 04/18/19 (from the past 48 hour(s))  Comprehensive metabolic panel     Status: Abnormal   Collection Time: 04/18/19  4:17 PM  Result Value Ref Range   Sodium 140 135 - 145 mmol/L   Potassium 3.7 3.5 - 5.1 mmol/L   Chloride 104 98 - 111 mmol/L   CO2 24 22 - 32 mmol/L   Glucose, Bld 122 (H) 70 - 99 mg/dL   BUN 12 6 - 20 mg/dL   Creatinine, Ser  0.96 0.61 - 1.24 mg/dL   Calcium 9.2 8.9 - 16.1 mg/dL   Total Protein 7.3 6.5 - 8.1 g/dL   Albumin 4.0 3.5 - 5.0 g/dL   AST 24 15 - 41 U/L   ALT 36 0 - 44 U/L   Alkaline Phosphatase 47 38 - 126 U/L   Total Bilirubin 0.5 0.3 - 1.2 mg/dL   GFR calc non Af Amer >60 >60 mL/min   GFR calc Af Amer >60 >60 mL/min   Anion gap 12 5 - 15    Comment: Performed at [facility], [address], [place], [place] [address]  Ethanol     Status: None   Collection Time: 04/18/19  4:17 PM  Result Value Ref Range   Alcohol, Ethyl (B) <10 <10 mg/dL    Comment: (NOTE) Lowest detectable limit for serum alcohol is 10 mg/dL. For medical purposes only. Performed at [facility], [address], [place], [place] [address]   CBC with Diff     Status: None   Collection Time: 04/18/19  4:17 PM  Result Value Ref Range   WBC 4.1 4.0 - 10.5 K/uL   RBC 4.74 4.22 - 5.81 MIL/uL   Hemoglobin 13.2 13.0 - 17.0 g/dL   HCT 11.9 14.7 - 82.9 %   MCV 87.1 80.0 - 100.0 fL   MCH 27.8 26.0 - 34.0 pg   MCHC 32.0 30.0 - 36.0 g/dL   RDW 56.2 13.0 - 86.5 %   Platelets 291 150 - 400 K/uL   nRBC 0.0 0.0 - 0.2 %   Neutrophils Relative % 46 %    Neutro Abs 1.9 1.7 - 7.7 K/uL   Lymphocytes Relative 36 %   Lymphs Abs 1.5 0.7 - 4.0 K/uL   Monocytes Relative 12 %   Monocytes Absolute 0.5 0.1 - 1.0 K/uL   Eosinophils Relative 4 %   Eosinophils Absolute 0.2 0.0 - 0.5 K/uL   Basophils Relative 1 %   Basophils Absolute 0.0 0.0 - 0.1 K/uL   Immature Granulocytes 1 %   Abs Immature Granulocytes 0.05 0.00 - 0.07 K/uL    Comment: Performed at [facility], [address], [place], [place] [address]  Urine rapid drug screen (hosp performed)     Status: Abnormal   Collection Time: 04/18/19  5:50 PM  Result Value Ref Range   Opiates POSITIVE (A) NONE DETECTED   Cocaine NONE DETECTED NONE DETECTED   Benzodiazepines NONE DETECTED NONE DETECTED   Amphetamines NONE DETECTED NONE DETECTED   Tetrahydrocannabinol NONE DETECTED NONE DETECTED   Barbiturates NONE DETECTED NONE DETECTED    Comment: (NOTE) DRUG SCREEN FOR MEDICAL PURPOSES ONLY.  IF CONFIRMATION IS NEEDED FOR ANY PURPOSE, NOTIFY LAB WITHIN 5 DAYS. LOWEST DETECTABLE LIMITS FOR URINE DRUG SCREEN Drug Class                     Cutoff (ng/mL) Amphetamine and metabolites    1000 Barbiturate and metabolites    200 Benzodiazepine                 200 Tricyclics and metabolites     300 Opiates and metabolites        300 Cocaine and metabolites        300 THC                            50 Performed at [facility], [address] [place], [place] [address]     Medications:  Current Facility-Administered Medications  Medication Dose Route Frequency Provider Last Rate Last Dose  . acetaminophen (TYLENOL) tablet 650 mg  650 mg Oral Q4H PRN Harris, Abigail, PA-C      . alum & mag hydroxide-simeth (MAALOX/MYLANTA) 200-200-20 MG/5ML suspension 30 mL  30 mL Oral Q6H PRN Arthor Captain, PA-C       Current Outpatient Medications  Medication Sig Dispense Refill  . risperiDONE (RISPERDAL) 1 MG tablet Take 1 tablet (1 mg total) by mouth daily. Take 1 mg every morning  (Patient taking differently: Take 1 mg by mouth See admin instructions. Take 1 mg every morning and then take 1 mg at bedtime with 3 mg tablet for a total of 4 mg) 30 tablet 0  . risperiDONE (RISPERDAL) 3 MG tablet Take 1 tablet (3 mg total) by mouth at bedtime. (Patient taking differently: Take 3 mg by mouth at bedtime. Take 3 mg with 1 mg tablet at bedtime for a total of 4 mg) 30 tablet 0  . risperiDONE (RISPERDAL) 3 MG tablet Take 1 tablet (3 mg total) by mouth at bedtime. (Patient not taking: Reported on 04/18/2019) 14 tablet 0    Musculoskeletal: Strength & Muscle Tone: within normal limits Gait & Station: normal Patient leans: N/A  Psychiatric Specialty Exam: Physical Exam  ROS  Blood pressure 134/76, pulse 74, temperature 97.8 F (36.6 C), temperature source Oral, resp. rate 16, height  (1.778 m), weight 106 kg, SpO2 98 %.Body mass index is 33.53 kg/m.  General Appearance: Casual  Eye Contact:  Good  Speech:  Clear and Coherent and Normal Rate  Volume:  Normal  Mood:  Appropriate  Affect:  Appropriate and Congruent  Thought Process:  Goal Directed, Linear and Descriptions of Associations: Intact  Orientation:  Full (Time, Place, and Person)  Thought Content:  Logical and Hallucinations: Auditory Chronic at baseline  Suicidal Thoughts:  No  Homicidal Thoughts:  No  Memory:  Immediate;   Good Recent;   Good  Judgement:  Intact  Insight:  Present  Psychomotor Activity:  Normal  Concentration:  Concentration: Good  Recall:  Good  Fund of Knowledge:  Fair  Language:  Good  Akathisia:  No  Handed:  Right  AIMS (if indicated):     Assets:  Communication Skills Desire for Improvement Resilience  ADL's:  Intact  Cognition:  WNL  Sleep:        Treatment Plan Summary: Plan Give patient dose of Risperdal and prescription to get filled for home.  Patient to follow up [facility]  SW checking to see how patient is to go about picking up a prescription at [facility]; will call  to inform patient on instructions.  RX for Risperdal 3 mg Q hs will need to be written for patient   [facility] pharmacy open Monday - Friday 9 am - 5 pm.    Disposition: No evidence of imminent risk to self or others at present.   Patient does not meet criteria for psychiatric inpatient admission. Supportive therapy provided about ongoing stressors. Discussed crisis plan, support from social network, calling 911, coming to the Emergency Department, and calling Suicide Hotline.  This service was provided via telemedicine using a 2-way, interactive audio and video technology.  Names of all persons participating in this telemedicine service and their role in this encounter. Name: [name], NP Role: Tele assessment  Name: Dr. Lucianne Muss Role: Psychiatrist  Name: [name]  Engram Role: Patient  Name: Dr. Rosalia Hammersay Role: Informed of above recommendation and disposition     , NP 04/19/2019 10:25 AM

## 2019-04-19 NOTE — ED Notes (Signed)
Lunch tray ordered 

## 2019-05-13 ENCOUNTER — Encounter (HOSPITAL_COMMUNITY): Payer: Self-pay | Admitting: Family Medicine

## 2019-05-13 ENCOUNTER — Emergency Department (HOSPITAL_COMMUNITY)
Admission: EM | Admit: 2019-05-13 | Discharge: 2019-05-14 | Disposition: A | Discharge status: Other Acute Inpatient Hospital | Payer: Self-pay | Attending: Emergency Medicine | Admitting: Emergency Medicine

## 2019-05-13 ENCOUNTER — Other Ambulatory Visit: Payer: Self-pay

## 2019-05-13 DIAGNOSIS — R440 Auditory hallucinations: Secondary | ICD-10-CM | POA: Insufficient documentation

## 2019-05-13 DIAGNOSIS — F17210 Nicotine dependence, cigarettes, uncomplicated: Secondary | ICD-10-CM | POA: Insufficient documentation

## 2019-05-13 DIAGNOSIS — Z20828 Contact with and (suspected) exposure to other viral communicable diseases: Secondary | ICD-10-CM | POA: Insufficient documentation

## 2019-05-13 NOTE — ED Triage Notes (Signed)
Patient states about 30 minutes to 1 hour he started experiencing auditory hallucinations. He states the hallucinations are telling him that they will harm him. However, he denies suicidal or homicidal thoughts.

## 2019-05-13 NOTE — ED Notes (Signed)
Bed: [facility] Expected date:  Expected time:  Means of arrival:  Comments: 

## 2019-05-13 NOTE — ED Provider Notes (Signed)
[facility]-EMERGENCY DEPT Provider Note   CSN: [id number] Arrival date & time: 05/13/19  2207    History   Chief Complaint Chief Complaint  Patient presents with  . Psychiatric Evaluation    HPI Cole Barrett is a 33 y.o. male.     33 year old male with a history of homelessness, dyslipidemia, schizophrenia, PE presents to the ED for complaints of auditory hallucinations.  He states that he has been hearing voices telling him that they will kill him.  This began while he was watching TV tonight.  States that he has been doing well for almost 1 month.  Reports compliance with his psychiatric medications.  He is requesting to speak with somebody about admission to behavioral health for stabilization.  Denies suicidal and homicidal thoughts as well as alcohol or illicit drug use.  The history is provided by the patient. No language interpreter was used.    Past Medical History:  Diagnosis Date  . Homelessness   . Hypercholesterolemia   . Mental disorder   . Paranoid behavior (HCC)   . PE (pulmonary embolism)   . Schizophrenia [facility](HCC)     Patient Active Problem List   Diagnosis Date Noted  . Schizoaffective disorder, bipolar type (HCC) 01/19/2019  . Alcohol abuse   . Auditory hallucinations   . Alcohol abuse with alcohol-induced mood disorder (HCC) 09/08/2016  . Homelessness 09/03/2016  . Person feigning illness 09/03/2016  . 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 Cannabis use disorder, mild, abuse 08/29/2016  . Pulmonary emboli (HCC) 07/13/2016  . Adjustment disorder with emotional disturbance 03/12/2014  . 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 Acute left flank pain 01/02/2014  . Pulmonary embolism, bilateral (HCC) 01/02/2014    History reviewed. No pertinent surgical history.      Home Medications    Prior to Admission medications   Medication Sig Start Date End Date Taking? Authorizing Provider   risperiDONE (RISPERDAL) 1 MG tablet Take 1 tablet (1 mg total) by mouth daily. Take 1 mg every morning Patient taking differently: Take 1 mg by mouth See admin instructions. Take 1 mg every morning and then take 1 mg at bedtime with 3 mg tablet for a total of 4 mg 04/09/19  Yes Horton, Mayer Maskerourtney F, MD  risperiDONE (RISPERDAL) 3 MG tablet Take 1 tablet (3 mg total) by mouth at bedtime. Patient taking differently: Take 3 mg by mouth at bedtime. Take 3 mg with 1 mg tablet at bedtime for a total of 4 mg 04/09/19  Yes Horton, Mayer Maskerourtney F, MD  risperiDONE (RISPERDAL) 3 MG tablet Take 1 tablet (3 mg total) by mouth at bedtime. Patient not taking: Reported on 05/13/2019 04/19/19   Tanda RockersVenter, Margaux, PA-C    Family History Family History  Problem Relation Age of Onset  . Hypertension Mother     Social History Social History   Tobacco Use  . Smoking status: Current Every Day Smoker    Packs/day: 0.50    Types: Cigarettes  . Smokeless tobacco: Never Used  Substance Use Topics  . Alcohol use: Yes    Comment: Last drink: this evening   . Drug use: Yes    Types: Marijuana    Comment: Last used: 2 months ago      Allergies   Haldol [haloperidol]   Review of Systems Review of Systems Ten systems reviewed and are negative for acute change, except as noted in the HPI.    Physical Exam Updated Vital Signs BP  134/83 (BP Location: Left Arm)   Pulse 69   Temp 98 F (36.7 C) (Oral)   Resp 18   Ht 5\' 9"  (1.753 m)   Wt 99.8 kg   SpO2 99%   BMI 32.49 kg/m   Physical Exam Vitals signs and nursing note reviewed.  Constitutional:      General: He is not in acute distress.    Appearance: He is well-developed. He is not diaphoretic.     Comments: Nontoxic appearing, pleasant.  HENT:     Head: Normocephalic and atraumatic.  Eyes:     General: No scleral icterus.    Conjunctiva/sclera: Conjunctivae normal.  Neck:     Musculoskeletal: Normal range of motion.  Cardiovascular:     Rate and  Rhythm: Normal rate and regular rhythm.     Pulses: Normal pulses.  Pulmonary:     Effort: Pulmonary effort is normal. No respiratory distress.     Breath sounds: No stridor.     Comments: Respirations even and unlabored Musculoskeletal: Normal range of motion.  Skin:    General: Skin is warm and dry.     Coloration: Skin is not pale.     Findings: No erythema or rash.  Neurological:     General: No focal deficit present.     Mental Status: He is alert and oriented to person, place, and time.     Coordination: Coordination normal.  Psychiatric:        Behavior: Behavior normal.     Comments: Not reacting to internal stimuli.  Denies suicidal and homicidal ideations.      ED Treatments / Results  Labs (all labs ordered are listed, but only abnormal results are displayed) Labs Reviewed  SARS CORONAVIRUS 2 (HOSPITAL ORDER, [place] [facility])    EKG None  Radiology No results found.  Procedures Procedures (including critical care time)  Medications Ordered in ED Medications - No data to display   Initial Impression / Assessment and Plan / ED Course  I have reviewed the triage vital signs and the nursing notes.  Pertinent labs & imaging results that were available during my care of the patient were reviewed by me and considered in my medical decision making (see chart for details).        33 year old male presents to the emergency department for auditory hallucinations.  He has been evaluated by TTS who recommend observation at [facility].  Patient hemodynamically stable.  With no medical complaints.  Cleared for transfer.   Final Clinical Impressions(s) / ED Diagnoses   Final diagnoses:  Auditory hallucinations    ED Discharge Orders    None       Antonietta Breach, PA-C 05/14/19 0103    Fatima Blank, MD 05/14/19 [phone]

## 2019-05-14 ENCOUNTER — Other Ambulatory Visit: Payer: Self-pay

## 2019-05-14 ENCOUNTER — Ambulatory Visit (HOSPITAL_COMMUNITY)
Admission: RE | Admit: 2019-05-14 | Discharge: 2019-05-14 | Disposition: A | Discharge status: Home / Self Care | Payer: Self-pay | Attending: Psychiatry | Admitting: Psychiatry

## 2019-05-14 ENCOUNTER — Emergency Department (HOSPITAL_COMMUNITY)
Admission: EM | Admit: 2019-05-14 | Discharge: 2019-05-14 | Disposition: A | Discharge status: Home / Self Care | Payer: Self-pay | Attending: Emergency Medicine | Admitting: Emergency Medicine

## 2019-05-14 DIAGNOSIS — Z86711 Personal history of pulmonary embolism: Secondary | ICD-10-CM | POA: Insufficient documentation

## 2019-05-14 DIAGNOSIS — F17210 Nicotine dependence, cigarettes, uncomplicated: Secondary | ICD-10-CM | POA: Insufficient documentation

## 2019-05-14 DIAGNOSIS — Z590 Homelessness: Secondary | ICD-10-CM | POA: Insufficient documentation

## 2019-05-14 DIAGNOSIS — Z8249 Family history of ischemic heart disease and other diseases of the circulatory system: Secondary | ICD-10-CM | POA: Insufficient documentation

## 2019-05-14 DIAGNOSIS — Z760 Encounter for issue of repeat prescription: Secondary | ICD-10-CM | POA: Insufficient documentation

## 2019-05-14 DIAGNOSIS — E7800 Pure hypercholesterolemia, unspecified: Secondary | ICD-10-CM | POA: Insufficient documentation

## 2019-05-14 DIAGNOSIS — Z888 Allergy status to other drugs, medicaments and biological substances status: Secondary | ICD-10-CM | POA: Insufficient documentation

## 2019-05-14 DIAGNOSIS — F209 Schizophrenia, unspecified: Secondary | ICD-10-CM | POA: Insufficient documentation

## 2019-05-14 LAB — SARS CORONAVIRUS 2 BY RT PCR (HOSPITAL ORDER, PERFORMED IN ~~LOC~~ HOSPITAL LAB): SARS Coronavirus 2: NEGATIVE

## 2019-05-14 MED ORDER — RISPERIDONE 3 MG PO TABS
3.0000 mg | ORAL_TABLET | Freq: Once | ORAL | Status: AC
  Administered 2019-05-14: 3 mg via ORAL
  Filled 2019-05-14: qty 1

## 2019-05-14 MED ORDER — RISPERIDONE 1 MG PO TABS: 1.0000 mg | ORAL_TABLET | Freq: Every day | ORAL | 0 refills | Status: DC

## 2019-05-14 MED ORDER — RISPERIDONE 3 MG PO TABS: 3.0000 mg | ORAL_TABLET | Freq: Every day | ORAL | 0 refills | Status: DC

## 2019-05-14 NOTE — Discharge Instructions (Addendum)
Please take your 1 mg Risperdal tablet in the MORNING.   Please take your 3 mg Risperdal tablet in the EVENING.   Return for any hallucinations.

## 2019-05-14 NOTE — H&P (Signed)
Behavioral Health Medical Screening Exam  Cole Barrett is an 33 y.o. male.  Total Time spent with patient: 20 minutes  Psychiatric Specialty Exam: Physical Exam  Constitutional: He is oriented to person, place, and time. He appears well-developed and well-nourished. No distress.  HENT:  Head: Normocephalic and atraumatic.  Right Ear: External ear normal.  Left Ear: External ear normal.  Eyes: Pupils are equal, round, and reactive to light. Conjunctivae are normal. Right eye exhibits no discharge. Left eye exhibits no discharge.  Respiratory: Effort normal. No respiratory distress.  Musculoskeletal: Normal range of motion.  Neurological: He is alert and oriented to person, place, and time.  Skin: He is not diaphoretic.  Psychiatric: His speech is normal. He is not withdrawn and not actively hallucinating. Thought content is not paranoid and not delusional. He exhibits a depressed mood. He expresses no homicidal and no suicidal ideation.    Review of Systems  Psychiatric/Behavioral: Positive for depression and hallucinations. Negative for memory loss, substance abuse and suicidal ideas. The patient is nervous/anxious. The patient does not have insomnia.     Blood pressure 136/75, pulse 99, temperature 98 F (36.7 C), temperature source Oral, resp. rate 16, SpO2 99 %.There is no height or weight on file to calculate BMI.  General Appearance: Casual and Fairly Groomed  Eye Contact:  Good  Speech:  Clear and Coherent and Normal Rate  Volume:  Normal  Mood:  Depressed  Affect:  Congruent and Depressed  Thought Process:  Coherent, Goal Directed and Descriptions of Associations: Intact  Orientation:  Full (Time, Place, and Person)  Thought Content:  Logical and Hallucinations: Auditory  Suicidal Thoughts:  No  Homicidal Thoughts:  No  Memory:  Immediate;   Fair Recent;   Fair  Judgement:  Fair  Insight:  Fair  Psychomotor Activity:  Normal  Concentration: Concentration:  Good and Attention Span: Good  Recall:  Good  Fund of Knowledge:Good  Language: Good  Akathisia:  Negative  Handed:  Right  AIMS (if indicated):     Assets:  Communication Skills Desire for Improvement Leisure Time Physical Health  Sleep:       Musculoskeletal: Strength & Muscle Tone: within normal limits Gait & Station: normal   Blood pressure 136/75, pulse 99, temperature 98 F (36.7 C), temperature source Oral, resp. rate 16, SpO2 99 %.  Recommendations:  Based on my evaluation the patient does not appear to have an emergency medical condition.  Rozetta Nunnery, NP 05/14/2019, 5:29 AM

## 2019-05-14 NOTE — ED Provider Notes (Signed)
[name]ENC[facility] Provider Note   CSN: [id number] Arrival date & time: 05/14/19  1539     History   Chief Complaint Chief Complaint  Patient presents with  . Medication Refill    HPI Cole Barrett is a 33 y.o. male.     HPI  Patient is a 33 year old male past medical history of schizophrenia, hypercholesterolemia, homelessness, pulmonary embolism presenting for medication refill.  He has been evaluated by multiple providers the last 24 hours including emergency department and behavioral health walk-in clinic for auditory hallucinations.  He is denying any auditory presentations here and denies any SI or HI.  He denies any restraints today.  He typically follows with [facility] and reports that he will be following up with them soon.  Past Medical History:  Diagnosis Date  . Homelessness   . Hypercholesterolemia   . Mental disorder   . Paranoid behavior (HCC)   . PE (pulmonary embolism)   . Schizophrenia [facility](HCC)     Patient Active Problem List   Diagnosis Date Noted  . Schizoaffective disorder, bipolar type (HCC) 01/19/2019  . Alcohol abuse   . Auditory hallucinations   . Alcohol abuse with alcohol-induced mood disorder (HCC) 09/08/2016  . Homelessness 09/03/2016  . Person feigning illness 09/03/2016  . 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 Cannabis use disorder, mild, abuse 08/29/2016  . Pulmonary emboli (HCC) 07/13/2016  . Adjustment disorder with emotional disturbance 03/12/2014  . 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 Acute left flank pain 01/02/2014  . Pulmonary embolism, bilateral (HCC) 01/02/2014    No past surgical history on file.      Home Medications    Prior to Admission medications   Medication Sig Start Date End Date Taking? Authorizing Provider  risperiDONE (RISPERDAL) 1 MG tablet Take 1 tablet (1 mg total) by mouth daily. Take 1 mg every morning Patient taking differently: Take 1  mg by mouth See admin instructions. Take 1 mg every morning and then take 1 mg at bedtime with 3 mg tablet for a total of 4 mg 04/09/19   Horton, Mayer Maskerourtney F, MD  risperiDONE (RISPERDAL) 3 MG tablet Take 1 tablet (3 mg total) by mouth at bedtime. Patient taking differently: Take 3 mg by mouth at bedtime. Take 3 mg with 1 mg tablet at bedtime for a total of 4 mg 04/09/19   Horton, Mayer Maskerourtney F, MD  risperiDONE (RISPERDAL) 3 MG tablet Take 1 tablet (3 mg total) by mouth at bedtime. Patient not taking: Reported on 05/13/2019 04/19/19   Tanda RockersVenter, Margaux, PA-C    Family History Family History  Problem Relation Age of Onset  . Hypertension Mother     Social History Social History   Tobacco Use  . Smoking status: Current Every Day Smoker    Packs/day: 0.50    Types: Cigarettes  . Smokeless tobacco: Never Used  Substance Use Topics  . Alcohol use: Yes    Comment: Last drink: this evening   . Drug use: Yes    Types: Marijuana    Comment: Last used: 2 months ago      Allergies   Haldol [haloperidol]   Review of Systems Review of Systems  Gastrointestinal: Negative for abdominal pain, nausea and vomiting.  Psychiatric/Behavioral: Negative for behavioral problems, hallucinations, self-injury and suicidal ideas.     Physical Exam Updated Vital Signs BP 136/72 (BP Location: Left Arm)   Pulse 77   Temp 98.1 F (36.7 C) (Oral)  Resp 16   SpO2 100%   Physical Exam Vitals signs and nursing note reviewed.  Constitutional:      General: He is not in acute distress.    Appearance: He is well-developed. He is not diaphoretic.     Comments: Sitting comfortably in bed.  HENT:     Head: Normocephalic and atraumatic.  Eyes:     General:        Right eye: No discharge.        Left eye: No discharge.     Conjunctiva/sclera: Conjunctivae normal.     Comments: EOMs normal to gross examination.  Neck:     Musculoskeletal: Normal range of motion.  Cardiovascular:     Rate and Rhythm:  Normal rate and regular rhythm.     Comments: Intact, 2+ radial pulse. Pulmonary:     Comments: Converses comfortably.  No audible wheeze or stridor. Abdominal:     General: There is no distension.  Musculoskeletal: Normal range of motion.  Skin:    General: Skin is warm and dry.  Neurological:     Mental Status: He is alert.     Comments: Cranial nerves intact to gross observation. Patient moves extremities without difficulty.  Psychiatric:     Comments: Alert and oriented x4.  He appears to have a flattened affect.  Normal and goal-directed speech.  Not responding to internal stimuli.  No SI or HI.      ED Treatments / Results  Labs (all labs ordered are listed, but only abnormal results are displayed) Labs Reviewed - No data to display  EKG    Radiology No results found.  Procedures Procedures (including critical care time)  Medications Ordered in ED Medications  risperiDONE (RISPERDAL) tablet 3 mg (has no administration in time range)     Initial Impression / Assessment and Plan / ED Course  I have reviewed the triage vital signs and the nursing notes.  Pertinent labs & imaging results that were available during my care of the patient were reviewed by me and considered in my medical decision making (see chart for details).        This is a well-appearing 33 year old male with past medical history of schizophrenia presenting for medication refill.  He is out of his Risperdal.  Reviewed record, and it was last prescribed on 04-19-2019.  Patient is able to verbally verify his regimen with me.  This is reasonable to refill.  I encourage patient to [facility] as [facility].  He was given return precautions for any SI, HI, or AVH.  Patient is in understanding and agrees with the plan of care.  Final Clinical Impressions(s) / ED Diagnoses   Final diagnoses:  Encounter for medication refill  Schizophrenia, unspecified type [facility])    ED Discharge Orders          Ordered    risperiDONE (RISPERDAL) 3 MG tablet  Daily at bedtime     05/14/19 1754    risperiDONE (RISPERDAL) 1 MG tablet  Daily     05/14/19 1754           Tamala Julian 05/14/19 1757    Daleen Bo, MD 05/15/19 [phone]

## 2019-05-14 NOTE — BH Assessment (Addendum)
Assessment Note  Cole Barrett is a 33 y.o. male who came to [place] from [place] ED for a [facility] Assessment due to hearing voices telling him that they are going to kill him, which has been especially upsetting to pt. Pt shares he had several good weeks in which he didn't have any difficulties with the symptoms but that today he was watching tv and he started hearing the voices. Clinician and pt discussed that, regardless of them being scary and upsetting, voices cannot kill him.  Pt denies SI, prior attempts of killing himself, or a plan to kill himself. Pt denies HI, VH, NSSIB, access to guns or weapons, use of substances, or involvement in the court system.  Pt shares he has no supports for clinician to contact for collateral.  Pt is oriented x4. His recent and remote memory is intact. Pt was cooperative and pleasant throughout the assessment process. Pt's insight, judgement, and impulse control is good at this time.   Diagnosis: F20.9, Schizophrenia   Past Medical History:  Past Medical History:  Diagnosis Date  . Homelessness   . Hypercholesterolemia   . Mental disorder   . Paranoid behavior ([place])   . PE (pulmonary embolism)   . Schizophrenia ([place])     No past surgical history on file.  Family History:  Family History  Problem Relation Age of Onset  . Hypertension Mother     Social History:  reports that he has been smoking cigarettes. He has been smoking about 0.50 packs per day. He has never used smokeless tobacco. He reports current alcohol use. He reports current drug use. Drug: Marijuana.  Additional Social History:  Alcohol / Drug Use Pain Medications: Please see MAR Prescriptions: Please see MAR Over the Counter: Please see MAR History of alcohol / drug use?: No history of alcohol / drug abuse Longest period of sobriety (when/how long): Pt denies SA  CIWA: CIWA-Ar BP: 136/75 Pulse Rate: 99 COWS:    Allergies:  Allergies  Allergen  Reactions  . Haldol [Haloperidol] Shortness Of Breath    Home Medications: (Not in a hospital admission)   OB/GYN Status:  No LMP for male patient.  General Assessment Data TTS Assessment: Out of system Is this a Tele or Face-to-Face Assessment?: Face-to-Face Is this an Initial Assessment or a Re-assessment for this encounter?: Re-Assessment Patient Accompanied by:: Parent, N/A, Adult Permission Given to speak with another: Yes Language Other than English: No Living Arrangements: Homeless/Shelter What gender do you identify as?: Male Marital status: Single Maiden name: Broadwell Pregnancy Status: No Living Arrangements: Alone Can pt return to current living arrangement?: Yes Admission Status: Voluntary Is patient capable of signing voluntary admission?: Yes Referral Source: MD Insurance type: None     Crisis Care Plan Living Arrangements: Alone Legal Guardian: Mother Name of Psychiatrist: [facility] Name of Therapist: None  Education Status Is patient currently in school?: No Is the patient employed, unemployed or receiving disability?: Unemployed  Risk to self with the past 6 months Suicidal Ideation: No Has patient been a risk to self within the past 6 months prior to admission? : No Suicidal Intent: No Has patient had any suicidal intent within the past 6 months prior to admission? : No Is patient at risk for suicide?: No Suicidal Plan?: No Has patient had any suicidal plan within the past 6 months prior to admission? : Other (comment) Access to Means: No What has been your use of drugs/alcohol within the last 12 months?: Pt  denies SA Previous Attempts/Gestures: No How many times?: 0 Other Self Harm Risks: None noted Triggers for Past Attempts: None known Intentional Self Injurious Behavior: None Family Suicide History: Unknown Persecutory voices/beliefs?: No Depression: Yes Depression Symptoms: Despondent Substance abuse history and/or treatment for substance  abuse?: No Suicide prevention information given to non-admitted patients: Yes  Risk to Others within the past 6 months Homicidal Ideation: No Does patient have any lifetime risk of violence toward others beyond the six months prior to admission? : No Thoughts of Harm to Others: No Current Homicidal Intent: No Current Homicidal Plan: No Access to Homicidal Means: No Identified Victim: None noted History of harm to others?: No Assessment of Violence: On admission Violent Behavior Description: (None noted) Does patient have access to weapons?: No Criminal Charges Pending?: No Does patient have a court date: No Is patient on probation?: No  Psychosis Hallucinations: None noted Delusions: None noted  Mental Status Report Appearance/Hygiene: In scrubs Eye Contact: Good Motor Activity: Freedom of movement Speech: Soft Level of Consciousness: Quiet/awake Mood: Anxious Affect: Anxious Anxiety Level: Moderate Thought Processes: Coherent Judgement: Partial Orientation: Person, Place, Time, Situation Obsessive Compulsive Thoughts/Behaviors: Moderate  Cognitive Functioning Concentration: Fair Memory: Recent Intact, Remote Intact Is patient IDD: No Insight: Fair Impulse Control: Fair Appetite: Fair Have you had any weight changes? : No Change Sleep: No Change Total Hours of Sleep: 8 Vegetative Symptoms: None  ADLScreening [facility]) Patient's cognitive ability adequate to safely complete daily activities?: Yes Patient able to express need for assistance with ADLs?: No Independently performs ADLs?: No  Prior Inpatient Therapy Prior Inpatient Therapy: No  Prior Outpatient Therapy Prior Outpatient Therapy: No Does patient have an ACCT team?: No Does patient have Intensive In-House Services?  : No Does patient have [facility] services? : No Does patient have [facility] services?: No  ADL Screening (condition at time of admission) Patient's cognitive ability  adequate to safely complete daily activities?: Yes Is the patient deaf or have difficulty hearing?: Yes Does the patient have difficulty seeing, even when wearing glasses/contacts?: No Does the patient have difficulty concentrating, remembering, or making decisions?: No Patient able to express need for assistance with ADLs?: No Does the patient have difficulty dressing or bathing?: Yes Independently performs ADLs?: No Weakness of Legs: None Weakness of Arms/Hands: None  Home Assistive Devices/Equipment Home Assistive Devices/Equipment: None  Therapy Consults (therapy consults require a physician order) PT Evaluation Needed: No OT Evalulation Needed: No SLP Evaluation Needed: No       Advance Directives (For Healthcare) Does Patient Have a Medical Advance Directive?: No Would patient like information on creating a medical advance directive?: No - Guardian declined        Disposition: Lindon Romp, NP, reviewed pt's chart and information and met with pt and determined that pt does not meet inpatient criteria. Pt was provided information and explained how Mobile Crisis Management works and was encouraged to utilize them in the future for mental health concerns. Pt expressed an understanding.   Disposition Initial Assessment Completed for this Encounter: Yes Disposition of Patient: Discharge Patient refused recommended treatment: No Mode of transportation if patient is discharged/movement?: Walking Patient referred to: Lindon Romp, NP, deter pt doesn't meet inpatient criteria)  On Site Evaluation by:   Reviewed with Physician:    Dannielle Burn 05/14/2019 3:53 AM

## 2019-05-14 NOTE — ED Notes (Signed)
Gave report to [place], Therapist, sports at [facility] observation unit.

## 2019-05-14 NOTE — ED Notes (Signed)
Notified [facility] for transportation to [facility].

## 2019-05-14 NOTE — ED Triage Notes (Signed)
Patient reports he needs a prescription for his Risperdal - has been out for 2 days. He was seen yesterday because he "was trying to get into behavioral health," but was discharged. Currently denies hallucinations, no SI/HI, just states he is here for his prescription refill.

## 2019-06-07 ENCOUNTER — Encounter (HOSPITAL_COMMUNITY): Payer: Self-pay | Admitting: Emergency Medicine

## 2019-06-07 ENCOUNTER — Emergency Department (HOSPITAL_COMMUNITY)
Admission: EM | Admit: 2019-06-07 | Discharge: 2019-06-07 | Disposition: A | Discharge status: Home / Self Care | Payer: Self-pay | Attending: Emergency Medicine | Admitting: Emergency Medicine

## 2019-06-07 ENCOUNTER — Other Ambulatory Visit: Payer: Self-pay

## 2019-06-07 DIAGNOSIS — R440 Auditory hallucinations: Secondary | ICD-10-CM | POA: Insufficient documentation

## 2019-06-07 DIAGNOSIS — F17210 Nicotine dependence, cigarettes, uncomplicated: Secondary | ICD-10-CM | POA: Insufficient documentation

## 2019-06-07 DIAGNOSIS — Z590 Homelessness: Secondary | ICD-10-CM | POA: Insufficient documentation

## 2019-06-07 DIAGNOSIS — F250 Schizoaffective disorder, bipolar type: Secondary | ICD-10-CM | POA: Insufficient documentation

## 2019-06-07 LAB — COMPREHENSIVE METABOLIC PANEL
ALT: 37 U/L (ref 0–44)
AST: 24 U/L (ref 15–41)
Albumin: 4.1 g/dL (ref 3.5–5.0)
Alkaline Phosphatase: 45 U/L (ref 38–126)
Anion gap: 11 (ref 5–15)
BUN: 12 mg/dL (ref 6–20)
CO2: 24 mmol/L (ref 22–32)
Calcium: 9.1 mg/dL (ref 8.9–10.3)
Chloride: 105 mmol/L (ref 98–111)
Creatinine, Ser: 1.19 mg/dL (ref 0.61–1.24)
GFR calc Af Amer: >60 mL/min (ref >60)
GFR calc non Af Amer: >60 mL/min (ref >60)
Glucose, Bld: 88 mg/dL (ref 70–99)
Potassium: 3.6 mmol/L (ref 3.5–5.1)
Sodium: 140 mmol/L (ref 135–145)
Total Bilirubin: 0.5 mg/dL (ref 0.3–1.2)
Total Protein: 7.3 g/dL (ref 6.5–8.1)

## 2019-06-07 LAB — CBC
HCT: 40.7 % (ref 39.0–52.0)
Hemoglobin: 13 g/dL (ref 13.0–17.0)
MCH: 27.8 pg (ref 26.0–34.0)
MCHC: 31.9 g/dL (ref 30.0–36.0)
MCV: 87 fL (ref 80.0–100.0)
Platelets: 308 10*3/uL (ref 150–400)
RBC: 4.68 MIL/uL (ref 4.22–5.81)
RDW: 12.7 % (ref 11.5–15.5)
WBC: 5.4 10*3/uL (ref 4.0–10.5)
nRBC: 0 % (ref 0.0–0.2)

## 2019-06-07 LAB — RAPID URINE DRUG SCREEN, HOSP PERFORMED
Amphetamines: NOT DETECTED
Barbiturates: NOT DETECTED
Benzodiazepines: NOT DETECTED
Cocaine: NOT DETECTED
Opiates: NOT DETECTED
Tetrahydrocannabinol: NOT DETECTED

## 2019-06-07 LAB — ETHANOL: Alcohol, Ethyl (B): 116 mg/dL — ABNORMAL HIGH (ref <10)

## 2019-06-07 LAB — ACETAMINOPHEN LEVEL: Acetaminophen (Tylenol), Serum: <10 ug/mL — ABNORMAL LOW (ref 10–30)

## 2019-06-07 LAB — SALICYLATE LEVEL: Salicylate Lvl: <7 mg/dL (ref 2.8–30.0)

## 2019-06-07 MED ORDER — RISPERIDONE 1 MG PO TABS
1.0000 mg | ORAL_TABLET | Freq: Once | ORAL | Status: AC
  Administered 2019-06-07: 1 mg via ORAL
  Filled 2019-06-07: qty 1

## 2019-06-07 NOTE — Progress Notes (Signed)
Per Lindon Romp, NP pt does not meet criteria for inpt tx. EDP Rancour, Annie Main, MD and pt's nurse have been advised. Pt is recommended to follow up with his current OPT, [facility].  Lind Covert, MSW, LCSW Therapeutic Triage Specialist  [phone]

## 2019-06-07 NOTE — Progress Notes (Signed)
TTS called charge nurse to advise pt needs to be assessed and TTS is calling cart but no answer. Was told RN would contact the pt's nurse to locate cart and place in room.   Lind Covert, MSW, LCSW Therapeutic Triage Specialist  [phone]

## 2019-06-07 NOTE — ED Triage Notes (Signed)
Patient states that he is hearing voices that want to kill himself.  Patient is not SI/HI.  Patient states he is running out of his Risperdal.

## 2019-06-07 NOTE — ED Notes (Signed)
Patient Alert and oriented to baseline. Stable and ambulatory to baseline. Patient verbalized understanding of the discharge instructions.  Patient belongings were taken by the patient.   

## 2019-06-07 NOTE — Discharge Instructions (Signed)
Your work-up today was overall reassuring and the psychiatry feel you are safe for discharge home.  He received your home dose of medication this morning.  Please avoid alcohol and stay hydrated and rest.  Please follow-up with your outpatient psychiatry team.  If any symptoms change or worsen, please return to the nearest emergency department.

## 2019-06-07 NOTE — ED Provider Notes (Signed)
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  Patient was seen by TTS and they feel he is safe for discharge home.  Anticipate reassessment after metabolism and likely discharge this morning after p.o. challenge and ambulation.  11:21 AM Patient was able to metabolize the alcohol and was able to safely ambulate eat and drink.  He was feeling much better.  He denied other complaints.  He requested his home dose of morning Risperdal which was ordered.  Patient will follow-up with outpatient psychiatry and understood return precautions.  Patient discharged in good condition clinically sober.   Clinical Impression: 1. Auditory hallucination     Disposition: Discharge  Condition: Good  I have discussed the results, Dx and Tx plan with the pt(& family if present). He/she/they expressed understanding and agree(s) with the plan. Discharge instructions discussed at great length. Strict return precautions discussed and pt &/or family have verbalized understanding of the instructions. No further questions at time of discharge.    New Prescriptions   No medications on file    Follow Up: [place] [address]   [id number] [phone] Schedule an appointment as soon as possible for a visit       Tegeler, Gwenyth Allegra, MD 06/07/19 1121

## 2019-06-07 NOTE — ED Notes (Addendum)
BELONGINGS IN LOCKER #3

## 2019-06-07 NOTE — Progress Notes (Signed)
TTS spoke with RN who requested 10 mins to set up telepsych cart.  Lind Covert, MSW, LCSW Therapeutic Triage Specialist  [phone]

## 2019-06-07 NOTE — ED Notes (Signed)
Pt sleeping with tech at bedside.  Does rouse and reposition self.  POC discussed with MD, will wake and do trial PO with ambulattion, reassess and discharge if sober.

## 2019-06-07 NOTE — BH Assessment (Addendum)
Tele Assessment Note   Patient Name: Cole Barrett MRN: [id number] Referring Physician: Glynn Octaveancour, Stephen, MD Location of Patient: [facility] Location of Provider: Behavioral Health TTS Department  Cole Barrett is an 33 y.o. male who presents to the ED voluntarily. Pt reports he came to the ED because he is hearing voices that tell him they are going to hurt him. Pt denies SI and denies HI. Pt [facility] and states he goes once a week to get his medication. Pt is well-known to this ED. Pt has 26 ED visits in the past 6 months. Pt often presents to the ED stating he is experiencing AH that tell him they are going to kill him. Pt is falling asleep throughout the assessment and name has to be called several times in order to keep him engaged. Pt states he lives alone in a house and states he is no longer homeless. Pt denies SA although BAL shows 116 on arrival to ED. Pt later states "maybe I had one beer." Pt states he has no plan or intent to harm himself or anyone else. Pt does not provide a collateral contact.    Per Nira ConnJason Berry, NP pt does not meet criteria for inpt tx. EDP Rancour, Jeannett SeniorStephen, MD and pt's nurse have been advised. Pt is recommended to follow up with his current OPT, [facility].  Diagnosis: Schizophrenia  Past Medical History:  Past Medical History:  Diagnosis Date  . Homelessness   . Hypercholesterolemia   . Mental disorder   . Paranoid behavior (HCC)   . PE (pulmonary embolism)   . Schizophrenia (HCC)     History reviewed. No pertinent surgical history.  Family History:  Family History  Problem Relation Age of Onset  . Hypertension Mother     Social History:  reports that he has been smoking cigarettes. He has been smoking about 0.50 packs per day. He has never used smokeless tobacco. He reports current alcohol use. He reports current drug use. Drug: Marijuana.  Additional Social History:  Alcohol / Drug  Use Pain Medications: See MAR Prescriptions: See MAR Over the Counter: See MAR History of alcohol / drug use?: Yes Substance #1 Name of Substance 1: Alcohol 1 - Age of First Use: unknown 1 - Amount (size/oz): BAL 116 1 - Frequency: pt denies use although labs positive 1 - Duration: pt denies use although labs positive 1 - Last Use / Amount: pt denies use although labs positive  CIWA: CIWA-Ar BP: 124/77 Pulse Rate: (!) 102 COWS:    Allergies:  Allergies  Allergen Reactions  . Haldol [Haloperidol] Shortness Of Breath    Home Medications: (Not in a hospital admission)   OB/GYN Status:  No LMP for male patient.  General Assessment Data Assessment unable to be completed: Yes Reason for not completing assessment: TTS calling cart, no answer [facility] TTS Assessment: In system Is this a Tele or Face-to-Face Assessment?: Tele Assessment Is this an Initial Assessment or a Re-assessment for this encounter?: Initial Assessment Patient Accompanied by:: N/A Language Other than English: No Living Arrangements: Other (Comment) What gender do you identify as?: Male Marital status: Single Pregnancy Status: No Living Arrangements: Alone Can pt return to current living arrangement?: Yes Admission Status: Voluntary Is patient capable of signing voluntary admission?: Yes Referral Source: Self/Family/Friend Insurance type: none     Crisis Care Plan Living Arrangements: Alone [facility]  Education Status  Is patient currently in school?: No Is the patient employed, unemployed or receiving disability?: Unemployed  Risk to self with the past 6 months Suicidal Ideation: No Has patient been a risk to self within the past 6 months prior to admission? : No Suicidal Intent: No Has patient had any suicidal intent within the past 6 months prior to admission? : No Is patient at risk for suicide?: No Suicidal Plan?:  No Has patient had any suicidal plan within the past 6 months prior to admission? : No Access to Means: No What has been your use of drugs/alcohol within the last 12 months?: denies use, BAL 116 Previous Attempts/Gestures: No Triggers for Past Attempts: None known Intentional Self Injurious Behavior: None Family Suicide History: No Recent stressful life event(s): Other (Comment)(AH) Persecutory voices/beliefs?: Yes Depression: No Substance abuse history and/or treatment for substance abuse?: Yes Suicide prevention information given to non-admitted patients: Not applicable  Risk to Others within the past 6 months Homicidal Ideation: No Does patient have any lifetime risk of violence toward others beyond the six months prior to admission? : No Thoughts of Harm to Others: No Current Homicidal Intent: No Current Homicidal Plan: No Access to Homicidal Means: No History of harm to others?: No Assessment of Violence: None Noted Does patient have access to weapons?: No Criminal Charges Pending?: Yes Describe Pending Criminal Charges: pt unclear, states he "missed an appointment" Does patient have a court date: Yes Court Date: 06/20/19 Is patient on probation?: No  Psychosis Hallucinations: Auditory, With command Delusions: Unspecified  Mental Status Report Appearance/Hygiene: Disheveled, In scrubs Eye Contact: Poor Motor Activity: Freedom of movement Speech: Slow, Slurred Level of Consciousness: Drowsy Mood: Helpless Affect: Flat Anxiety Level: None Thought Processes: Relevant, Coherent Judgement: Partial Orientation: Person, Place, Time, Situation, Appropriate for developmental age Obsessive Compulsive Thoughts/Behaviors: None  Cognitive Functioning Concentration: Fair Memory: Recent Intact, Remote Intact Is patient IDD: No Insight: Fair Impulse Control: Good Appetite: Good Have you had any weight changes? : No Change Sleep: No Change Total Hours of Sleep:  8 Vegetative Symptoms: None  ADLScreening [facility] Assessment Services) Patient's cognitive ability adequate to safely complete daily activities?: Yes Patient able to express need for assistance with ADLs?: Yes Independently performs ADLs?: Yes (appropriate for developmental age)  Prior Inpatient Therapy Prior Inpatient Therapy: Yes Prior Therapy Dates: 2019, 2018 Prior Therapy Facilty/Provider(s): [facility] Reason for Treatment: MH issues  Prior Outpatient Therapy Prior Outpatient Therapy: Yes Prior Therapy Dates: ongoing Prior Therapy Facilty/Provider(s): [facility] Reason for Treatment: med management Does patient have an ACCT team?: No Does patient have Intensive In-House Services?  : No Does patient have [facility] services? : Yes Does patient have P4CC services?: No  ADL Screening (condition at time of admission) Patient's cognitive ability adequate to safely complete daily activities?: Yes Is the patient deaf or have difficulty hearing?: No Does the patient have difficulty seeing, even when wearing glasses/contacts?: No Does the patient have difficulty concentrating, remembering, or making decisions?: Yes Patient able to express need for assistance with ADLs?: Yes Does the patient have difficulty dressing or bathing?: No Independently performs ADLs?: Yes (appropriate for developmental age) Does the patient have difficulty walking or climbing stairs?: No Weakness of Legs: None Weakness of Arms/Hands: None  Home Assistive Devices/Equipment Home Assistive Devices/Equipment: None    Abuse/Neglect Assessment (Assessment to be complete while patient is alone) Abuse/Neglect Assessment Can Be Completed: Yes Physical Abuse: Denies Verbal Abuse: Denies Sexual Abuse: Denies Exploitation of patient/patient's resources: Denies Self-Neglect: Denies  Advance Directives (For Healthcare) Does Patient Have a Medical Advance Directive?: No Would patient like information on creating a  medical advance directive?: No - Patient declined          Disposition: Per Nira ConnJason Berry, NP pt does not meet criteria for inpt tx. EDP Rancour, Jeannett SeniorStephen, MD and pt's nurse have been advised. Pt is recommended to follow up with his current OPT, [facility]. Disposition Initial Assessment Completed for this Encounter: Yes Disposition of Patient: Discharge Patient refused recommended treatment: No Mode of transportation if patient is discharged/movement?: Walking  This service was provided via telemedicine using a 2-way, interactive audio and video technology.  Names of all persons participating in this telemedicine service and their role in this encounter. Name: Cole Barrett Role: Patient  Name: Princess BruinsAquicha Carys Malina Role: TTS          Karolee Ohsquicha R Yaa Donnellan 06/07/2019 6:44 AM

## 2019-06-07 NOTE — ED Provider Notes (Signed)
[name] Provider Note   CSN: [id number] Arrival date & time: 06/07/19  0416     History   Chief Complaint Chief Complaint  Patient presents with  . Hallucinations    HPI Cole Barrett is a 33 y.o. male.     Level 5 caveat for intoxication.  Patient here stating he is hearing voices telling him to kill himself.  History of similar visits in the past.  Patient with history of homelessness and schizophrenia and paranoia.  He is prescribed Risperdal which he states he has been taking other than yesterday.  Denies any other medications or illicit street drugs.  Denies any alcohol use.  Denies any pain.  Denies any nausea or vomiting.  Falls asleep during history taking and will not stay awake.  The history is provided by the patient.    Past Medical History:  Diagnosis Date  . Homelessness   . Hypercholesterolemia   . Mental disorder   . Paranoid behavior (HCC)   . PE (pulmonary embolism)   . Schizophrenia [facility](HCC)     Patient Active Problem List   Diagnosis Date Noted  . Schizoaffective disorder, bipolar type (HCC) 01/19/2019  . Alcohol abuse   . Auditory hallucinations   . Alcohol abuse with alcohol-induced mood disorder (HCC) 09/08/2016  . Homelessness 09/03/2016  . Person feigning illness 09/03/2016  . 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 Cannabis use disorder, mild, abuse 08/29/2016  . Pulmonary emboli (HCC) 07/13/2016  . Adjustment disorder with emotional disturbance 03/12/2014  . 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 Acute left flank pain 01/02/2014  . Pulmonary embolism, bilateral (HCC) 01/02/2014    History reviewed. No pertinent surgical history.      Home Medications    Prior to Admission medications   Medication Sig Start Date End Date Taking? Authorizing Provider  risperiDONE (RISPERDAL) 1 MG tablet Take 1 tablet (1 mg total) by mouth daily. Take 1 mg every morning 05/14/19    Aviva KluverMurray, Alyssa B, PA-C  risperiDONE (RISPERDAL) 3 MG tablet Take 1 tablet (3 mg total) by mouth at bedtime. 05/14/19   Elisha PonderMurray, Alyssa B, PA-C    Family History Family History  Problem Relation Age of Onset  . Hypertension Mother     Social History Social History   Tobacco Use  . Smoking status: Current Every Day Smoker    Packs/day: 0.50    Types: Cigarettes  . Smokeless tobacco: Never Used  Substance Use Topics  . Alcohol use: Yes    Comment: Last drink: this evening   . Drug use: Yes    Types: Marijuana    Comment: Last used: 2 months ago      Allergies   Haldol [haloperidol]   Review of Systems Review of Systems  Unable to perform ROS: Psychiatric disorder     Physical Exam Updated Vital Signs BP 124/77 (BP Location: Right Arm)   Pulse (!) 102   Temp 98.2 F (36.8 C) (Oral)   Resp 16   SpO2 97%   Physical Exam Vitals signs and nursing note reviewed.  Constitutional:      General: He is not in acute distress.    Appearance: He is well-developed.     Comments: disheveled  HENT:     Head: Normocephalic and atraumatic.     Mouth/Throat:     Mouth: Mucous membranes are moist.     Pharynx: No oropharyngeal exudate.  Eyes:     Conjunctiva/sclera: Conjunctivae  normal.     Pupils: Pupils are equal, round, and reactive to light.  Neck:     Musculoskeletal: Normal range of motion and neck supple.     Comments: No meningismus. Cardiovascular:     Rate and Rhythm: Normal rate and regular rhythm.     Heart sounds: Normal heart sounds. No murmur.  Pulmonary:     Effort: Pulmonary effort is normal. No respiratory distress.     Breath sounds: Normal breath sounds.  Chest:     Chest wall: No tenderness.  Abdominal:     Palpations: Abdomen is soft.     Tenderness: There is no abdominal tenderness. There is no guarding or rebound.  Musculoskeletal: Normal range of motion.        General: No tenderness.  Skin:    General: Skin is warm.     Capillary Refill:  Capillary refill takes less than 2 seconds.     Findings: No rash.  Neurological:     General: No focal deficit present.     Mental Status: He is alert and oriented to person, place, and time. Mental status is at baseline.     Cranial Nerves: No cranial nerve deficit.     Motor: No abnormal muscle tone.     Coordination: Coordination normal.     Comments:  5/5 strength throughout. CN 2-12 intact.Equal grip strength.   Psychiatric:        Behavior: Behavior normal.      ED Treatments / Results  Labs (all labs ordered are listed, but only abnormal results are displayed) Labs Reviewed  ETHANOL - Abnormal; Notable for the following components:      Result Value   Alcohol, Ethyl (B) 116 (*)    All other components within normal limits  ACETAMINOPHEN LEVEL - Abnormal; Notable for the following components:   Acetaminophen (Tylenol), Serum <10 (*)    All other components within normal limits  COMPREHENSIVE METABOLIC PANEL  SALICYLATE LEVEL  CBC  RAPID URINE DRUG SCREEN, HOSP PERFORMED    EKG None  Radiology No results found.  Procedures Procedures (including critical care time)  Medications Ordered in ED Medications - No data to display   Initial Impression / Assessment and Plan / ED Course  I have reviewed the triage vital signs and the nursing notes.  Pertinent labs & imaging results that were available during my care of the patient were reviewed by me and considered in my medical decision making (see chart for details).       Schizophrenic hearing voices telling him to kill himself.  He is intoxicated.  Appears to be at his baseline.  TTS consult obtained and patient does not meet inpatient criteria. He will be allowed to sober and be reevaluated prior to discharge. Care signed out to oncoming physician.   Final Clinical Impressions(s) / ED Diagnoses   Final diagnoses:  None    ED Discharge Orders    None       Randen Kauth, Annie Main, MD 06/07/19 [phone]

## 2019-06-07 NOTE — Progress Notes (Signed)
TTS calling cart, no answer  Morghan Kester, MSW, LCSW Therapeutic Triage Specialist  [phone]  

## 2019-07-05 ENCOUNTER — Encounter (HOSPITAL_COMMUNITY): Payer: Self-pay

## 2019-07-05 ENCOUNTER — Emergency Department (HOSPITAL_COMMUNITY)
Admission: EM | Admit: 2019-07-05 | Discharge: 2019-07-05 | Disposition: A | Discharge status: Home / Self Care | Payer: Self-pay | Attending: Emergency Medicine | Admitting: Emergency Medicine

## 2019-07-05 ENCOUNTER — Other Ambulatory Visit: Payer: Self-pay

## 2019-07-05 DIAGNOSIS — R440 Auditory hallucinations: Secondary | ICD-10-CM | POA: Insufficient documentation

## 2019-07-05 DIAGNOSIS — Z590 Homelessness: Secondary | ICD-10-CM | POA: Insufficient documentation

## 2019-07-05 DIAGNOSIS — Z79899 Other long term (current) drug therapy: Secondary | ICD-10-CM | POA: Insufficient documentation

## 2019-07-05 DIAGNOSIS — F17210 Nicotine dependence, cigarettes, uncomplicated: Secondary | ICD-10-CM | POA: Insufficient documentation

## 2019-07-05 DIAGNOSIS — F209 Schizophrenia, unspecified: Secondary | ICD-10-CM | POA: Insufficient documentation

## 2019-07-05 MED ORDER — RISPERIDONE 2 MG PO TABS
3.0000 mg | ORAL_TABLET | Freq: Once | ORAL | Status: AC
  Administered 2019-07-05: 3 mg via ORAL
  Filled 2019-07-05: qty 1

## 2019-07-05 NOTE — ED Provider Notes (Signed)
[facility]-EMERGENCY DEPT Provider Note  CSN: [id number] Arrival date & time: 07/05/19 2225  Chief Complaint(s) Hallucinations (Auditory)  HPI Cole Barrett is a 33 y.o. male with a history of schizophrenia and homelessness who presents to the emergency department with 3 hours of auditory hallucinations.  He reports that they have been under control but he has been out of his medication for 3 days.  States that he is been [facility] to [facility].  He states that the voices tell him to kill himself but he does not want to listen or hurt himself.  He does not complain of any HI.  Denies any other physical complaints.  Requests his nighttime dose of medication and states that he will follow-up with [facility] in the morning.  HPI  Past Medical History Past Medical History:  Diagnosis Date  . Homelessness   . Hypercholesterolemia   . Mental disorder   . Paranoid behavior (HCC)   . PE (pulmonary embolism)   . Schizophrenia [facility](HCC)    Patient Active Problem List   Diagnosis Date Noted  . Schizoaffective disorder, bipolar type (HCC) 01/19/2019  . Alcohol abuse   . Auditory hallucinations   . Alcohol abuse with alcohol-induced mood disorder (HCC) 09/08/2016  . Homelessness 09/03/2016  . Person feigning illness 09/03/2016  . 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 Cannabis use disorder, mild, abuse 08/29/2016  . Pulmonary emboli (HCC) 07/13/2016  . Adjustment disorder with emotional disturbance 03/12/2014  . 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 Acute left flank pain 01/02/2014  . Pulmonary embolism, bilateral (HCC) 01/02/2014   Home Medication(s) Prior to Admission medications   Medication Sig Start Date End Date Taking? Authorizing Provider  risperiDONE (RISPERDAL) 1 MG tablet Take 1 tablet (1 mg total) by mouth daily. Take 1 mg every morning Patient taking differently: Take 1 mg by mouth daily.   05/14/19   Aviva KluverMurray, Alyssa B, PA-C  risperiDONE (RISPERDAL) 3 MG tablet Take 1 tablet (3 mg total) by mouth at bedtime. 05/14/19   Elisha PonderMurray, Alyssa B, PA-C                                                                                                                                    Past Surgical History History reviewed. No pertinent surgical history. Family History Family History  Problem Relation Age of Onset  . Hypertension Mother     Social History Social History   Tobacco Use  . Smoking status: Current Every Day Smoker    Packs/day: 0.50    Types: Cigarettes  . Smokeless tobacco: Never Used  Substance Use Topics  . Alcohol use: Yes    Comment: Last drink: this evening   . Drug use: Yes    Types: Marijuana    Comment: Last used: 2 months ago    Allergies Haldol [haloperidol]  Review of Systems Review  of Systems All other systems are reviewed and are negative for acute change except as noted in the HPI  Physical Exam Vital Signs  I have reviewed the triage vital signs BP 137/85 (BP Location: Left Arm)   Pulse 85   Temp 97.8 F (36.6 C) (Oral)   Resp 18   SpO2 98%   Physical Exam Vitals signs reviewed.  Constitutional:      General: He is not in acute distress.    Appearance: He is well-developed. He is not diaphoretic.  HENT:     Head: Normocephalic and atraumatic.     Jaw: No trismus.     Right Ear: External ear normal.     Left Ear: External ear normal.     Nose: Nose normal.  Eyes:     General: No scleral icterus.    Conjunctiva/sclera: Conjunctivae normal.  Neck:     Musculoskeletal: Normal range of motion.     Trachea: Phonation normal.  Cardiovascular:     Rate and Rhythm: Normal rate and regular rhythm.  Pulmonary:     Effort: Pulmonary effort is normal. No respiratory distress.     Breath sounds: No stridor.  Abdominal:     General: There is no distension.  Musculoskeletal: Normal range of motion.  Neurological:     Mental Status: He is  alert and oriented to person, place, and time.  Psychiatric:        Attention and Perception: Attention normal.        Mood and Affect: Mood normal.        Speech: Speech normal.        Behavior: Behavior normal.        Thought Content: Thought content normal.     ED Results and Treatments Labs (all labs ordered are listed, but only abnormal results are displayed) Labs Reviewed - No data to display                                                                                                                       EKG  EKG Interpretation  Date/Time:    Ventricular Rate:    PR Interval:    QRS Duration:   QT Interval:    QTC Calculation:   R Axis:     Text Interpretation:        Radiology No results found.  Pertinent labs & imaging results that were available during my care of the patient were reviewed by me and considered in my medical decision making (see chart for details).  Medications Ordered in ED Medications  risperiDONE (RISPERDAL) tablet 3 mg (has no administration in time range)  Procedures Procedures  (including critical care time)  Medical Decision Making / ED Course I have reviewed the nursing notes for this encounter and the patient's prior records (if available in EHR or on provided paperwork).   Cole Barrett was evaluated in Emergency Department on 07/05/2019 for the symptoms described in the history of present illness. He was evaluated in the context of the global COVID-19 pandemic, which necessitated consideration that the patient might be at risk for infection with the SARS-CoV-2 virus that causes COVID-19. Institutional protocols and algorithms that pertain to the evaluation of patients at risk for COVID-19 are in a state of rapid change based on information released by regulatory bodies including the CDC and  federal and state organizations. These policies and algorithms were followed during the patient's care in the ED.  Provided with Risperidal dose. Does not appear to be a threat to himself or others at this time.  Stable for discharge and outpatient management.      Final Clinical Impression(s) / ED Diagnoses Final diagnoses:  Auditory hallucination     The patient appears reasonably screened and/or stabilized for discharge and I doubt any other medical condition or other [facility] requiring further screening, evaluation, or treatment in the ED at this time prior to discharge.  Disposition: Discharge  Condition: Good  I have discussed the results, Dx and Tx plan with the patient who expressed understanding and agree(s) with the plan. Discharge instructions discussed at great length. The patient was given strict return precautions who verbalized understanding of the instructions. No further questions at time of discharge.    ED Discharge Orders    None        [facility] [address] [facility] [address] [place] [id number] [phone]  Go to       This chart was dictated using voice recognition software.  Despite best efforts to proofread,  errors can occur which can change the documentation meaning.   Nira Connardama,  Eduardo, MD 07/05/19 2348

## 2019-07-05 NOTE — ED Triage Notes (Signed)
Pt reports that he started hearing voices "real bad" about 3 hours ago. States that he ran out of his meds yesterday. He denies SI/HI. States that the voices say they will kill him, but he does not want to hurt himself or anyone else.

## 2019-07-13 ENCOUNTER — Emergency Department (HOSPITAL_COMMUNITY)
Admission: EM | Admit: 2019-07-13 | Discharge: 2019-07-14 | Disposition: A | Discharge status: Home / Self Care | Payer: Self-pay | Attending: Emergency Medicine | Admitting: Emergency Medicine

## 2019-07-13 ENCOUNTER — Encounter (HOSPITAL_COMMUNITY): Payer: Self-pay | Admitting: Emergency Medicine

## 2019-07-13 DIAGNOSIS — R454 Irritability and anger: Secondary | ICD-10-CM | POA: Insufficient documentation

## 2019-07-13 DIAGNOSIS — R440 Auditory hallucinations: Secondary | ICD-10-CM | POA: Insufficient documentation

## 2019-07-13 DIAGNOSIS — Z046 Encounter for general psychiatric examination, requested by authority: Secondary | ICD-10-CM | POA: Insufficient documentation

## 2019-07-13 DIAGNOSIS — F209 Schizophrenia, unspecified: Secondary | ICD-10-CM | POA: Insufficient documentation

## 2019-07-13 DIAGNOSIS — Z79899 Other long term (current) drug therapy: Secondary | ICD-10-CM | POA: Insufficient documentation

## 2019-07-13 DIAGNOSIS — F17210 Nicotine dependence, cigarettes, uncomplicated: Secondary | ICD-10-CM | POA: Insufficient documentation

## 2019-07-13 DIAGNOSIS — R443 Hallucinations, unspecified: Secondary | ICD-10-CM

## 2019-07-13 NOTE — ED Notes (Signed)
Asked for urine - urinal given

## 2019-07-13 NOTE — ED Notes (Signed)
Pt changed out placed in paper scrubs

## 2019-07-13 NOTE — ED Triage Notes (Signed)
Pt states " he has a problem, with voices in his head." the voices want to hurt him."  Pt states he denies SI and HI. Pt just wants so more medications and is asking for help. Stays " this problem started getting bad tonight".  Pt took a taxi here, from [address], [place], ( his home ) pt lives alone. Denies owning any weapons. Denies having any plan to hurt self or others.

## 2019-07-14 LAB — ETHANOL: Alcohol, Ethyl (B): <10 mg/dL (ref <10)

## 2019-07-14 LAB — CBC WITH DIFFERENTIAL/PLATELET
Abs Immature Granulocytes: 0.07 10*3/uL (ref 0.00–0.07)
Basophils Absolute: 0 10*3/uL (ref 0.0–0.1)
Basophils Relative: 1 %
Eosinophils Absolute: 0.2 10*3/uL (ref 0.0–0.5)
Eosinophils Relative: 4 %
HCT: 37.8 % — ABNORMAL LOW (ref 39.0–52.0)
Hemoglobin: 11.9 g/dL — ABNORMAL LOW (ref 13.0–17.0)
Immature Granulocytes: 2 %
Lymphocytes Relative: 34 %
Lymphs Abs: 1.5 10*3/uL (ref 0.7–4.0)
MCH: 28.5 pg (ref 26.0–34.0)
MCHC: 31.5 g/dL (ref 30.0–36.0)
MCV: 90.6 fL (ref 80.0–100.0)
Monocytes Absolute: 0.5 10*3/uL (ref 0.1–1.0)
Monocytes Relative: 12 %
Neutro Abs: 2.1 10*3/uL (ref 1.7–7.7)
Neutrophils Relative %: 47 %
Platelets: 256 10*3/uL (ref 150–400)
RBC: 4.17 MIL/uL — ABNORMAL LOW (ref 4.22–5.81)
RDW: 13.2 % (ref 11.5–15.5)
WBC: 4.4 10*3/uL (ref 4.0–10.5)
nRBC: 0 % (ref 0.0–0.2)

## 2019-07-14 LAB — COMPREHENSIVE METABOLIC PANEL
ALT: 41 U/L (ref 0–44)
AST: 29 U/L (ref 15–41)
Albumin: 3.8 g/dL (ref 3.5–5.0)
Alkaline Phosphatase: 40 U/L (ref 38–126)
Anion gap: 12 (ref 5–15)
BUN: 12 mg/dL (ref 6–20)
CO2: 26 mmol/L (ref 22–32)
Calcium: 8.6 mg/dL — ABNORMAL LOW (ref 8.9–10.3)
Chloride: 102 mmol/L (ref 98–111)
Creatinine, Ser: 1 mg/dL (ref 0.61–1.24)
GFR calc Af Amer: >60 mL/min (ref >60)
GFR calc non Af Amer: >60 mL/min (ref >60)
Glucose, Bld: 102 mg/dL — ABNORMAL HIGH (ref 70–99)
Potassium: 3.6 mmol/L (ref 3.5–5.1)
Sodium: 140 mmol/L (ref 135–145)
Total Bilirubin: 0.3 mg/dL (ref 0.3–1.2)
Total Protein: 7 g/dL (ref 6.5–8.1)

## 2019-07-14 LAB — RAPID URINE DRUG SCREEN, HOSP PERFORMED
Amphetamines: NOT DETECTED
Barbiturates: NOT DETECTED
Benzodiazepines: NOT DETECTED
Cocaine: NOT DETECTED
Opiates: NOT DETECTED
Tetrahydrocannabinol: NOT DETECTED

## 2019-07-14 NOTE — Discharge Instructions (Addendum)
Continue to take your medicine every day as you have been instructed at [facility].  Please follow-up with [facility] this week.

## 2019-07-14 NOTE — ED Provider Notes (Signed)
[facility] DEPT Provider Note   CSN: [id number] Arrival date & time: 07/13/19  2252     History   Chief Complaint Chief Complaint  Patient presents with  . Medical Clearance    hearing voices     HPI Cole Barrett is a 33 y.o. male.     The history is provided by the patient. No language interpreter was used.  Mental Health Problem Presenting symptoms: hallucinations   Degree of incapacity (severity):  Moderate Onset quality:  Unable to specify Timing:  Constant Progression:  Worsening Relieved by:  Nothing Worsened by:  Nothing Ineffective treatments:  Antipsychotics Associated symptoms: irritability    Pt complains of having hallucinations.  Pt reports that when hallucinations get this bad he knows he needs help.  Pt states he is taking his medication.  Pt states he is afraid of what the voices tell him.   Past Medical History:  Diagnosis Date  . Homelessness   . Hypercholesterolemia   . Mental disorder   . Paranoid behavior ([place])   . PE (pulmonary embolism)   . Schizophrenia [facility])     Patient Active Problem List   Diagnosis Date Noted  . Schizoaffective disorder, bipolar type ([place]) 01/19/2019  . Alcohol abuse   . Auditory hallucinations   . Alcohol abuse with alcohol-induced mood disorder ([place]) 09/08/2016  . Homelessness 09/03/2016  . Person feigning illness 09/03/2016  . Brief psychotic disorder ([place]) 08/29/2016  . Cannabis use disorder, mild, abuse 08/29/2016  . Pulmonary emboli ([place]) 07/13/2016  . Adjustment disorder with emotional disturbance 03/12/2014  . Pulmonary embolism and infarction ([place]) 01/02/2014  . Acute left flank pain 01/02/2014  . Pulmonary embolism, bilateral ([place]) 01/02/2014    History reviewed. No pertinent surgical history.      Home Medications    Prior to Admission medications   Medication Sig Start Date End Date Taking? Authorizing Provider  risperiDONE (RISPERDAL) 1 MG tablet  Take 1 tablet (1 mg total) by mouth daily. Take 1 mg every morning Patient taking differently: Take 1 mg by mouth daily.  05/14/19  Yes Valere Dross, Alyssa B, PA-C  risperiDONE (RISPERDAL) 3 MG tablet Take 1 tablet (3 mg total) by mouth at bedtime. 05/14/19  Yes Albesa Seen, PA-C    Family History Family History  Problem Relation Age of Onset  . Hypertension Mother     Social History Social History   Tobacco Use  . Smoking status: Current Every Day Smoker    Packs/day: 0.50    Types: Cigarettes  . Smokeless tobacco: Never Used  Substance Use Topics  . Alcohol use: Yes    Comment: Last drink: this evening   . Drug use: Yes    Types: Marijuana    Comment: Last used: 2 months ago      Allergies   Haldol [haloperidol]   Review of Systems Review of Systems  Constitutional: Positive for irritability.  Psychiatric/Behavioral: Positive for hallucinations.  All other systems reviewed and are negative.    Physical Exam Updated Vital Signs BP (!) 159/81 (BP Location: Left Arm)   Pulse 72   Temp 98.2 F (36.8 C) (Oral)   Resp 15   Ht 5\' 10"  (1.778 m)   Wt 99 kg   SpO2 100%   BMI 31.32 kg/m   Physical Exam Vitals signs and nursing note reviewed.  Constitutional:      Appearance: He is well-developed.  HENT:     Head: Normocephalic and atraumatic.  Mouth/Throat:     Mouth: Mucous membranes are moist.  Eyes:     Conjunctiva/sclera: Conjunctivae normal.  Neck:     Musculoskeletal: Neck supple.  Cardiovascular:     Rate and Rhythm: Normal rate and regular rhythm.     Heart sounds: No murmur.  Pulmonary:     Effort: Pulmonary effort is normal. No respiratory distress.     Breath sounds: Normal breath sounds.  Abdominal:     Palpations: Abdomen is soft.     Tenderness: There is no abdominal tenderness.  Skin:    General: Skin is warm and dry.  Neurological:     General: No focal deficit present.     Mental Status: He is alert.      ED Treatments /  Results  Labs (all labs ordered are listed, but only abnormal results are displayed) Labs Reviewed  CBC WITH DIFFERENTIAL/PLATELET - Abnormal; Notable for the following components:      Result Value   RBC 4.17 (*)    Hemoglobin 11.9 (*)    HCT 37.8 (*)    All other components within normal limits  COMPREHENSIVE METABOLIC PANEL - Abnormal; Notable for the following components:   Glucose, Bld 102 (*)    Calcium 8.6 (*)    All other components within normal limits  ETHANOL  RAPID URINE DRUG SCREEN, HOSP PERFORMED    EKG None  Radiology No results found.  Procedures Procedures (including critical care time)  Medications Ordered in ED Medications - No data to display   Initial Impression / Assessment and Plan / ED Course  I have reviewed the triage vital signs and the nursing notes.  Pertinent labs & imaging results that were available during my care of the patient were reviewed by me and considered in my medical decision making (see chart for details).        TTS consult   Final Clinical Impressions(s) / ED Diagnoses   Final diagnoses:  Hallucinations    ED Discharge Orders    None       Osie CheeksSofia, Leslie K, PA-C 07/14/19 Donato Schultz0045    Knapp, Iva, MD 07/14/19 [id number]    Devoria AlbeKnapp, Iva, MD 07/14/19 [phone]

## 2019-07-14 NOTE — ED Notes (Signed)
Pt attempting to give urine sample

## 2019-07-14 NOTE — BH Assessment (Signed)
Tele Assessment Note   Patient Name: Cole Barrett MRN: [id number] Referring Physician: Campbell LernerLeslie Sophia, PA Location of Patient: [facility] Location of Provider: Behavioral Health TTS Department  Cole Barrett is an 33 y.o. male.  -Clinician reviewed note by Campbell LernerLeslie Sophia, PA.  Pt complains of having hallucinations.  Pt reports that when hallucinations get this bad he knows he needs help.  Pt states he is taking his medication.  Pt states he is afraid of what the voices tell him.  [facility]  He has been hearing voices telling him they are going to kill him.  He says it is getting worse and that is why he came in.  Pt denies any visual hallucinations.  Patient denies any SI or HI.  No plan or intention to kill himself or anyone else.  Patient also denies use of any substances at this time.  His BAL is <10 and his UDS is clear.  Patient has a flat affect and good eye contact.  He denies depressive symptoms and anxiety.  He reports that he is on only one medication, resperdal and that he has been taking it as directed.  Patient however is unclear as to who is prescribing the medication.  Clinician prompted him on whether it was [facility].  He is uncertain as to when his next appointment is.  Clinician did encourage him to follow up on this issue.  [facility].  He says he has been to [facility]'s crisis center in the past also.  When asked if patient felt safe going home he said, "Yeah, yeah I feel straight now."  Patient did not have any collateral contacts for clinician to follow up on.  -Clinician discussed patient care with Nira ConnJason Berry, FNP.  He did not recommend inpatient care for patient but to follow up with current provider.  Clinician informed Dr. Devoria AlbeIva Knapp of disposition.  Diagnosis: F20.9 Schizophrenia  Past Medical History:  Past Medical History:  Diagnosis Date  . Homelessness   .  Hypercholesterolemia   . Mental disorder   . Paranoid behavior ([facility])   . PE (pulmonary embolism)   . Schizophrenia ([facility])     History reviewed. No pertinent surgical history.  Family History:  Family History  Problem Relation Age of Onset  . Hypertension Mother     Social History:  reports that he has been smoking cigarettes. He has been smoking about 0.50 packs per day. He has never used smokeless tobacco. He reports current alcohol use. He reports current drug use. Drug: Marijuana.  Additional Social History:  Alcohol / Drug Use Prescriptions: Risperdol Over the Counter: None History of alcohol / drug use?: Yes Substance #1 Name of Substance 1: Pt denies current drug use.  CIWA: CIWA-Ar BP: 115/82 Pulse Rate: 64 COWS:    Allergies:  Allergies  Allergen Reactions  . Haldol [Haloperidol] Shortness Of Breath    Home Medications: (Not in a hospital admission)   OB/GYN Status:  No LMP for male patient.  General Assessment Data Location of Assessment: [facility] TTS Assessment: In system Is this a Tele or Face-to-Face Assessment?: Tele Assessment Is this an Initial Assessment or a Re-assessment for this encounter?: Initial Assessment Patient Accompanied by:: N/A Language Other than English: No Living Arrangements: Other (Comment)(Lives alone.) What gender do you identify as?: Male Marital status: Single Pregnancy Status: No Living Arrangements: Alone Can pt return to current living arrangement?: Yes Admission Status:  Voluntary Is patient capable of signing voluntary admission?: Yes Referral Source: Self/Family/Friend Insurance type: self pay     Crisis Care Plan Living Arrangements: Alone [facility] Name of Therapist: None  Education Status Is patient currently in school?: No Is the patient employed, unemployed or receiving disability?: Employed  Risk to self with the past 6 months Suicidal Ideation: No Has patient been a risk to self  within the past 6 months prior to admission? : No Suicidal Intent: No Has patient had any suicidal intent within the past 6 months prior to admission? : No Is patient at risk for suicide?: No Suicidal Plan?: No Has patient had any suicidal plan within the past 6 months prior to admission? : No Access to Means: No What has been your use of drugs/alcohol within the last 12 months?: Denies use Previous Attempts/Gestures: No How many times?: 0 Other Self Harm Risks: None Triggers for Past Attempts: None known Intentional Self Injurious Behavior: None Family Suicide History: No Recent stressful life event(s): Other (Comment)(Pt does not identify any current stressors.) Persecutory voices/beliefs?: Yes Depression: No Depression Symptoms: (Denies depressive symptoms) Substance abuse history and/or treatment for substance abuse?: Yes Suicide prevention information given to non-admitted patients: Not applicable  Risk to Others within the past 6 months Homicidal Ideation: No Does patient have any lifetime risk of violence toward others beyond the six months prior to admission? : No Thoughts of Harm to Others: No Current Homicidal Intent: No Current Homicidal Plan: No Access to Homicidal Means: No Identified Victim: No one History of harm to others?: No Assessment of Violence: None Noted Violent Behavior Description: None reported Does patient have access to weapons?: No Criminal Charges Pending?: No Describe Pending Criminal Charges: Pt unsure says "I got to look into it." Does patient have a court date: No Court Date: (Pt unsure) Is patient on probation?: No  Psychosis Hallucinations: Auditory(Voices saying they are going to kill him.) Delusions: None noted  Mental Status Report Appearance/Hygiene: Body odor, In scrubs Eye Contact: Fair Motor Activity: Freedom of movement Speech: Logical/coherent Level of Consciousness: Quiet/awake Mood: Depressed Affect: Flat Anxiety Level:  None Thought Processes: Coherent, Relevant Judgement: Partial Orientation: Person, Place, Time, Situation Obsessive Compulsive Thoughts/Behaviors: None  Cognitive Functioning Concentration: Normal Memory: Recent Intact, Remote Intact Is patient IDD: No Insight: Poor Impulse Control: Good Appetite: Good Have you had any weight changes? : No Change Sleep: No Change Total Hours of Sleep: ("Getting some good sleep") Vegetative Symptoms: None  [facility] Patient's cognitive ability adequate to safely complete daily activities?: Yes Patient able to express need for assistance with ADLs?: Yes Independently performs ADLs?: Yes (appropriate for developmental age)  Prior Inpatient Therapy Prior Inpatient Therapy: Yes Prior Therapy Dates: 2019, 2018 Prior Therapy Facilty/Provider(s): [facility] Reason for Treatment: hearing voices  Prior Outpatient Therapy Prior Outpatient Therapy: Yes Prior Therapy Dates: ongoing Prior Therapy Facilty/Provider(s): [facility] Reason for Treatment: med management Does patient have an ACCT team?: No Does patient have Intensive In-House Services?  : No Does patient have [facility] services? : Yes Does patient have P4CC services?: No  ADL Screening (condition at time of admission) Patient's cognitive ability adequate to safely complete daily activities?: Yes Is the patient deaf or have difficulty hearing?: No Does the patient have difficulty seeing, even when wearing glasses/contacts?: No Does the patient have difficulty concentrating, remembering, or making decisions?: No Patient able to express need for assistance with ADLs?: Yes Does the patient have difficulty dressing or bathing?: No  Independently performs ADLs?: Yes (appropriate for developmental age) Does the patient have difficulty walking or climbing stairs?: No Weakness of Legs: None Weakness of Arms/Hands: None       Abuse/Neglect Assessment (Assessment to be  complete while patient is alone) Abuse/Neglect Assessment Can Be Completed: Yes Physical Abuse: Denies Verbal Abuse: Denies Sexual Abuse: Denies Exploitation of patient/patient's resources: Denies Self-Neglect: Denies     Regulatory affairs officer (For Healthcare) Does Patient Have a Medical Advance Directive?: No Would patient like information on creating a medical advance directive?: No - Patient declined          Disposition:  Disposition Initial Assessment Completed for this Encounter: Yes Patient referred to: Other (Comment)(Follow up w/ Beverly Sessions)  This service was provided via telemedicine using a 2-way, interactive audio and video technology.  Names of all persons participating in this telemedicine service and their role in this encounter. Name: Andres Bantz Role: patient  Name: Curlene Dolphin, M.S. LCAS QP Role: clinician  Name:  Role:   Name:  Role:     Raymondo Band 07/14/2019 4:40 AM

## 2019-07-20 ENCOUNTER — Other Ambulatory Visit: Payer: Self-pay

## 2019-07-20 ENCOUNTER — Encounter (HOSPITAL_COMMUNITY): Payer: Self-pay | Admitting: *Deleted

## 2019-07-20 ENCOUNTER — Emergency Department (HOSPITAL_COMMUNITY)
Admission: EM | Admit: 2019-07-20 | Discharge: 2019-07-20 | Disposition: A | Discharge status: Home / Self Care | Payer: Self-pay | Attending: Emergency Medicine | Admitting: Emergency Medicine

## 2019-07-20 DIAGNOSIS — Z046 Encounter for general psychiatric examination, requested by authority: Secondary | ICD-10-CM | POA: Insufficient documentation

## 2019-07-20 DIAGNOSIS — R440 Auditory hallucinations: Secondary | ICD-10-CM | POA: Insufficient documentation

## 2019-07-20 DIAGNOSIS — F17210 Nicotine dependence, cigarettes, uncomplicated: Secondary | ICD-10-CM | POA: Insufficient documentation

## 2019-07-20 DIAGNOSIS — F209 Schizophrenia, unspecified: Secondary | ICD-10-CM | POA: Insufficient documentation

## 2019-07-20 DIAGNOSIS — Z79899 Other long term (current) drug therapy: Secondary | ICD-10-CM | POA: Insufficient documentation

## 2019-07-20 LAB — COMPREHENSIVE METABOLIC PANEL
ALT: 37 U/L (ref 0–44)
AST: 28 U/L (ref 15–41)
Albumin: 3.7 g/dL (ref 3.5–5.0)
Alkaline Phosphatase: 40 U/L (ref 38–126)
Anion gap: 11 (ref 5–15)
BUN: 8 mg/dL (ref 6–20)
CO2: 23 mmol/L (ref 22–32)
Calcium: 8.5 mg/dL — ABNORMAL LOW (ref 8.9–10.3)
Chloride: 105 mmol/L (ref 98–111)
Creatinine, Ser: 1.2 mg/dL (ref 0.61–1.24)
GFR calc Af Amer: >60 mL/min (ref >60)
GFR calc non Af Amer: >60 mL/min (ref >60)
Glucose, Bld: 127 mg/dL — ABNORMAL HIGH (ref 70–99)
Potassium: 4 mmol/L (ref 3.5–5.1)
Sodium: 139 mmol/L (ref 135–145)
Total Bilirubin: 0.7 mg/dL (ref 0.3–1.2)
Total Protein: 6.5 g/dL (ref 6.5–8.1)

## 2019-07-20 LAB — CBC
HCT: 38.5 % — ABNORMAL LOW (ref 39.0–52.0)
Hemoglobin: 12.3 g/dL — ABNORMAL LOW (ref 13.0–17.0)
MCH: 28.3 pg (ref 26.0–34.0)
MCHC: 31.9 g/dL (ref 30.0–36.0)
MCV: 88.5 fL (ref 80.0–100.0)
Platelets: 307 10*3/uL (ref 150–400)
RBC: 4.35 MIL/uL (ref 4.22–5.81)
RDW: 13.1 % (ref 11.5–15.5)
WBC: 4.4 10*3/uL (ref 4.0–10.5)
nRBC: 0 % (ref 0.0–0.2)

## 2019-07-20 LAB — RAPID URINE DRUG SCREEN, HOSP PERFORMED
Amphetamines: NOT DETECTED
Barbiturates: NOT DETECTED
Benzodiazepines: NOT DETECTED
Cocaine: NOT DETECTED
Opiates: NOT DETECTED
Tetrahydrocannabinol: NOT DETECTED

## 2019-07-20 LAB — ETHANOL: Alcohol, Ethyl (B): <10 mg/dL (ref <10)

## 2019-07-20 LAB — SALICYLATE LEVEL: Salicylate Lvl: <7 mg/dL (ref 2.8–30.0)

## 2019-07-20 LAB — ACETAMINOPHEN LEVEL: Acetaminophen (Tylenol), Serum: <10 ug/mL — ABNORMAL LOW (ref 10–30)

## 2019-07-20 MED ORDER — RISPERIDONE 3 MG PO TABS
3.0000 mg | ORAL_TABLET | Freq: Every day | ORAL | Status: DC
  Filled 2019-07-20: qty 1

## 2019-07-20 NOTE — Discharge Instructions (Addendum)
Continue your daily prescribed medications.  Follow-up with [facility] for ongoing psychiatric care.

## 2019-07-20 NOTE — ED Provider Notes (Signed)
[facility] EMERGENCY DEPARTMENT Provider Note   CSN: [id number] Arrival date & time: 07/20/19  0036     History   Chief Complaint Chief Complaint  Patient presents with  . Psychiatric Evaluation    HPI Joshuwa Vecchio is a 33 y.o. male.     33 year old male with a history of homelessness, paranoid behavior, schizophrenia presents to the emergency department for psychiatric evaluation.  He states that his auditory hallucinations became more intense tonight and he felt that he needs to come to the ED for evaluation.  He denies any SI or HI.  He has been compliant with his Risperdal.  No other complaints for this visit.  He did not take his nightly Risperdal and is requesting this be given to him tonight.  The history is provided by the patient. No language interpreter was used.    Past Medical History:  Diagnosis Date  . Homelessness   . Hypercholesterolemia   . Mental disorder   . Paranoid behavior ([place])   . PE (pulmonary embolism)   . Schizophrenia [facility])     Patient Active Problem List   Diagnosis Date Noted  . Schizoaffective disorder, bipolar type ([place]) 01/19/2019  . Alcohol abuse   . Auditory hallucinations   . Alcohol abuse with alcohol-induced mood disorder ([place]) 09/08/2016  . Homelessness 09/03/2016  . Person feigning illness 09/03/2016  . Brief psychotic disorder ([place]) 08/29/2016  . Cannabis use disorder, mild, abuse 08/29/2016  . Pulmonary emboli ([place]) 07/13/2016  . Adjustment disorder with emotional disturbance 03/12/2014  . Pulmonary embolism and infarction ([place]) 01/02/2014  . Acute left flank pain 01/02/2014  . Pulmonary embolism, bilateral ([place]) 01/02/2014    History reviewed. No pertinent surgical history.      Home Medications    Prior to Admission medications   Medication Sig Start Date End Date Taking? Authorizing Provider  risperiDONE (RISPERDAL) 1 MG tablet Take 1 tablet (1 mg total) by mouth daily. Take 1 mg  every morning Patient taking differently: Take 1 mg by mouth daily.  05/14/19   Langston Masker B, PA-C  risperiDONE (RISPERDAL) 3 MG tablet Take 1 tablet (3 mg total) by mouth at bedtime. 05/14/19   Albesa Seen, PA-C    Family History Family History  Problem Relation Age of Onset  . Hypertension Mother     Social History Social History   Tobacco Use  . Smoking status: Current Every Day Smoker    Packs/day: 0.50    Types: Cigarettes  . Smokeless tobacco: Never Used  Substance Use Topics  . Alcohol use: Yes    Comment: Last drink: this evening   . Drug use: Yes    Types: Marijuana    Comment: Last used: 2 months ago      Allergies   Haldol [haloperidol]   Review of Systems Review of Systems Ten systems reviewed and are negative for acute change, except as noted in the HPI.    Physical Exam Updated Vital Signs BP 138/74 (BP Location: Right Arm)   Pulse 92   Temp 98.3 F (36.8 C) (Oral)   Resp 18   Ht 5\' 10"  (1.778 m)   Wt 99.8 kg   SpO2 98%   BMI 31.57 kg/m   Physical Exam Vitals signs and nursing note reviewed.  Constitutional:      General: He is not in acute distress.    Appearance: He is well-developed. He is not diaphoretic.     Comments: Nontoxic appearing and  in NAD  HENT:     Head: Normocephalic and atraumatic.  Eyes:     General: No scleral icterus.    Conjunctiva/sclera: Conjunctivae normal.  Neck:     Musculoskeletal: Normal range of motion.  Pulmonary:     Effort: Pulmonary effort is normal. No respiratory distress.     Comments: Respirations even and unlabored Musculoskeletal: Normal range of motion.  Skin:    General: Skin is warm and dry.     Coloration: Skin is not pale.     Findings: No erythema or rash.  Neurological:     Mental Status: He is alert and oriented to person, place, and time.  Psychiatric:        Behavior: Behavior normal.     Comments: No SI or HI.  Not reacting to internal stimuli.      ED Treatments /  Results  Labs (all labs ordered are listed, but only abnormal results are displayed) Labs Reviewed  COMPREHENSIVE METABOLIC PANEL - Abnormal; Notable for the following components:      Result Value   Glucose, Bld 127 (*)    Calcium 8.5 (*)    All other components within normal limits  ACETAMINOPHEN LEVEL - Abnormal; Notable for the following components:   Acetaminophen (Tylenol), Serum <10 (*)    All other components within normal limits  CBC - Abnormal; Notable for the following components:   Hemoglobin 12.3 (*)    HCT 38.5 (*)    All other components within normal limits  ETHANOL  SALICYLATE LEVEL  RAPID URINE DRUG SCREEN, HOSP PERFORMED    EKG None  Radiology No results found.  Procedures Procedures (including critical care time)  Medications Ordered in ED Medications  risperiDONE (RISPERDAL) tablet 3 mg (has no administration in time range)     Initial Impression / Assessment and Plan / ED Course  I have reviewed the triage vital signs and the nursing notes.  Pertinent labs & imaging results that were available during my care of the patient were reviewed by me and considered in my medical decision making (see chart for details).        33 year old male presents to the emergency department for evaluation after his auditory hallucinations worsened tonight.  He has been medically cleared and evaluated by TTS who recommend outpatient follow-up with [facility].  He was given his nightly dose of Risperdal.  Discharged in stable condition.   Final Clinical Impressions(s) / ED Diagnoses   Final diagnoses:  Auditory hallucinations    ED Discharge Orders    None       Antony MaduraHumes, Taleisha Kaczynski, PA-C 07/20/19 [id number]    Geoffery Lyonselo, [place], MD 07/20/19 [phone]

## 2019-07-20 NOTE — ED Notes (Signed)
TTS completed. 

## 2019-07-20 NOTE — ED Notes (Signed)
Belongings inventoried, placed in locker #4, valuables placed with security.

## 2019-07-20 NOTE — BH Assessment (Addendum)
Tele Assessment Note   Patient Name: Cole Barrett MRN: [id number] Referring Physician: Merrily Pew, MD Location of Patient: [facility], [facility]020C Location of Provider: [place] is an 33 y.o. single male who presents unaccompanied to [facility] reporting auditory hallucinations. Pt reports he is hearing voices that tell him they are going to kill him. This is a persistent hallucination and says "they have gotten bad." Pt has presented to local ED over 30 times in 2020 with the same symptoms. He denies depressive symptoms or problems with sleep or appetite. He denies current suicidal ideation or history of suicide attempts. Pt denies any history of intentional self-injurious behaviors. Pt denies current homicidal ideation or history of violence. Pt denies any history of auditory or visual hallucinations. Pt has a history of using alcohol but denies recent use of alcohol or other substances.  Pt cannot identify any stressors other than auditory hallucinations. He states he lives alone and is not homeless. He identifies his cousin as his only support. He denies legal problems. He denies access to firearms. He states he is prescribed Risperdal by [facility] and he takes medication as prescribed. He says he has been psychiatrically hospitalized several times in the past but not recently.  Pt would not give permission to contact anyone for collateral information.  Pt is dressed in hospital scrubs, alert and oriented x4. Pt speaks in a clear tone, at moderate volume and normal pace. Motor behavior appears normal although Pt frequently puts his hand in his pant and appears to be scratching his genitals. Eye contact is good. Pt's mood is euthymic and affect is somewhat blunted. Thought process is coherent and relevant. Pt's insight is limited. Pt says he wants to be either admitted to [facility] or allowed to spend the night in the ED and leave in the  morning.   Diagnosis: F20.9 Schizophrenia  Past Medical History:  Past Medical History:  Diagnosis Date  . Homelessness   . Hypercholesterolemia   . Mental disorder   . Paranoid behavior ([place])   . PE (pulmonary embolism)   . Schizophrenia ([place])     History reviewed. No pertinent surgical history.  Family History:  Family History  Problem Relation Age of Onset  . Hypertension Mother     Social History:  reports that he has been smoking cigarettes. He has been smoking about 0.50 packs per day. He has never used smokeless tobacco. He reports current alcohol use. He reports current drug use. Drug: Marijuana.  Additional Social History:  Alcohol / Drug Use Pain Medications: Denies abuse Prescriptions: Denies abuse Over the Counter: Denies abuse History of alcohol / drug use?: Yes(Pt has history of using alcohol but denies alcohol or drug use at this time) Longest period of sobriety (when/how long): Unknown  CIWA: CIWA-Ar BP: 138/74 Pulse Rate: 92 COWS:    Allergies:  Allergies  Allergen Reactions  . Haldol [Haloperidol] Shortness Of Breath    Home Medications: (Not in a hospital admission)   OB/GYN Status:  No LMP for male patient.  General Assessment Data Location of Assessment: [facility] ED TTS Assessment: In system Is this a Tele or Face-to-Face Assessment?: Tele Assessment Is this an Initial Assessment or a Re-assessment for this encounter?: Initial Assessment Patient Accompanied by:: N/A Language Other than English: No Living Arrangements: Other (Comment)(Lives alone ) What gender do you identify as?: Male Marital status: Single Maiden name: NA Pregnancy Status: No Living Arrangements: Alone Can pt  return to current living arrangement?: Yes Admission Status: Voluntary Is patient capable of signing voluntary admission?: Yes Referral Source: Self/Family/Friend Insurance type: Self-pay     Crisis Care Plan Living Arrangements: Alone Legal Guardian:  Other:(Self) [facility] Name of Therapist: None  Education Status Is patient currently in school?: No Is the patient employed, unemployed or receiving disability?: Unemployed  Risk to self with the past 6 months Suicidal Ideation: No Has patient been a risk to self within the past 6 months prior to admission? : No Suicidal Intent: No Has patient had any suicidal intent within the past 6 months prior to admission? : No Is patient at risk for suicide?: No Suicidal Plan?: No Has patient had any suicidal plan within the past 6 months prior to admission? : No Access to Means: No What has been your use of drugs/alcohol within the last 12 months?: Pt denies recent use Previous Attempts/Gestures: No How many times?: 0 Other Self Harm Risks: None Triggers for Past Attempts: None known Intentional Self Injurious Behavior: None Family Suicide History: No Recent stressful life event(s): Other (Comment)(Pt cannot identify any stressors) Persecutory voices/beliefs?: Yes Depression: No Depression Symptoms: (Pt denies depressive symptoms) Substance abuse history and/or treatment for substance abuse?: Yes Suicide prevention information given to non-admitted patients: Not applicable  Risk to Others within the past 6 months Homicidal Ideation: No Does patient have any lifetime risk of violence toward others beyond the six months prior to admission? : No Thoughts of Harm to Others: No Current Homicidal Intent: No Current Homicidal Plan: No Access to Homicidal Means: No Identified Victim: None History of harm to others?: No Assessment of Violence: None Noted Violent Behavior Description: Pt denies history of violence Does patient have access to weapons?: No Criminal Charges Pending?: No Describe Pending Criminal Charges: None Does patient have a court date: No Is patient on probation?: No  Psychosis Hallucinations: Auditory(Hearing voice telling him they are going to  kill him) Delusions: None noted  Mental Status Report Appearance/Hygiene: Body odor, In scrubs Eye Contact: Good Motor Activity: Unremarkable Speech: Logical/coherent Level of Consciousness: Quiet/awake Mood: Euthymic Affect: Blunted Anxiety Level: None Thought Processes: Coherent Judgement: Partial Orientation: Person, Place, Time, Situation Obsessive Compulsive Thoughts/Behaviors: None  Cognitive Functioning Concentration: Normal Memory: Recent Intact, Remote Intact Is patient IDD: No Insight: Poor Impulse Control: Fair Appetite: Good Have you had any weight changes? : No Change Sleep: No Change Total Hours of Sleep: 8 Vegetative Symptoms: None  ADLScreening [facility] Assessment Services) Patient's cognitive ability adequate to safely complete daily activities?: Yes Patient able to express need for assistance with ADLs?: Yes Independently performs ADLs?: Yes (appropriate for developmental age)  Prior Inpatient Therapy Prior Inpatient Therapy: Yes Prior Therapy Dates: 2019, 2018 Prior Therapy Facilty/Provider(s): [facility] Reason for Treatment: hearing voices  Prior Outpatient Therapy Prior Outpatient Therapy: Yes Prior Therapy Dates: ongoing Prior Therapy Facilty/Provider(s): [facility] Reason for Treatment: med management Does patient have an ACCT team?: No Does patient have Intensive In-House Services?  : No Does patient have [facility] services? : Yes Does patient have P4CC services?: No  ADL Screening (condition at time of admission) Patient's cognitive ability adequate to safely complete daily activities?: Yes Is the patient deaf or have difficulty hearing?: No Does the patient have difficulty seeing, even when wearing glasses/contacts?: No Does the patient have difficulty concentrating, remembering, or making decisions?: No Patient able to express need for assistance with ADLs?: Yes Does the patient have difficulty dressing or bathing?: No Independently performs  ADLs?: Yes (appropriate for developmental age) Does the patient have difficulty walking or climbing stairs?: No Weakness of Legs: None Weakness of Arms/Hands: None  Home Assistive Devices/Equipment Home Assistive Devices/Equipment: None    Abuse/Neglect Assessment (Assessment to be complete while patient is alone) Abuse/Neglect Assessment Can Be Completed: Yes Physical Abuse: Denies Verbal Abuse: Denies Sexual Abuse: Denies Exploitation of patient/patient's resources: Denies Self-Neglect: Denies     Merchant navy officerAdvance Directives (For Healthcare) Does Patient Have a Medical Advance Directive?: No Would patient like information on creating a medical advance directive?: No - Patient declined          Disposition: Gave clinical report to Nira ConnJason Berry, FNP who recommended Pt be discharged and follow up with [facility]. Notified Antony MaduraKelly Humes, PA-C and Tenna DelaineKaren Newman, RN of recommendation.   Disposition Initial Assessment Completed for this Encounter: Yes  This service was provided via telemedicine using a 2-way, interactive audio and video technology.  Names of all persons participating in this telemedicine service and their role in this encounter. Name: Jacquelin Hawkinghristopher Adam Biever Role: Patient  Name: Shela CommonsFord Liron Eissler Jr, [facility] Role: TTS counselor         Harlin RainFord Ellis Patsy BaltimoreWarrick Jr, [facility], [facility], [facility] Triage Specialist [phone]  Pamalee LeydenWarrick Jr, Shelonda Saxe Ellis 07/20/2019 2:18 AM

## 2019-07-20 NOTE — ED Triage Notes (Signed)
The pt was just at [facility] long ed 8-15 for the same reason hes here tonight.  He reports hearing voices for 2 hours ttelling him that they were going to kill him

## 2019-07-20 NOTE — ED Triage Notes (Signed)
The pt reports that he is not si or hi the pt has had his hands in his pants and scratching his penis as long as he was being triaged  And he asked to go to the br

## 2019-07-24 ENCOUNTER — Other Ambulatory Visit: Payer: Self-pay

## 2019-07-24 ENCOUNTER — Emergency Department (HOSPITAL_COMMUNITY)
Admission: EM | Admit: 2019-07-24 | Discharge: 2019-07-24 | Disposition: A | Discharge status: Home / Self Care | Payer: Self-pay | Attending: Emergency Medicine | Admitting: Emergency Medicine

## 2019-07-24 DIAGNOSIS — F17210 Nicotine dependence, cigarettes, uncomplicated: Secondary | ICD-10-CM | POA: Insufficient documentation

## 2019-07-24 DIAGNOSIS — R443 Hallucinations, unspecified: Secondary | ICD-10-CM

## 2019-07-24 DIAGNOSIS — Y905 Blood alcohol level of 100-119 mg/100 ml: Secondary | ICD-10-CM | POA: Insufficient documentation

## 2019-07-24 DIAGNOSIS — Z590 Homelessness: Secondary | ICD-10-CM | POA: Insufficient documentation

## 2019-07-24 DIAGNOSIS — F10920 Alcohol use, unspecified with intoxication, uncomplicated: Secondary | ICD-10-CM | POA: Insufficient documentation

## 2019-07-24 DIAGNOSIS — Z79899 Other long term (current) drug therapy: Secondary | ICD-10-CM | POA: Insufficient documentation

## 2019-07-24 DIAGNOSIS — F200 Paranoid schizophrenia: Secondary | ICD-10-CM | POA: Insufficient documentation

## 2019-07-24 DIAGNOSIS — Z86711 Personal history of pulmonary embolism: Secondary | ICD-10-CM | POA: Insufficient documentation

## 2019-07-24 LAB — CBC
HCT: 42.3 % (ref 39.0–52.0)
Hemoglobin: 13.4 g/dL (ref 13.0–17.0)
MCH: 27.9 pg (ref 26.0–34.0)
MCHC: 31.7 g/dL (ref 30.0–36.0)
MCV: 87.9 fL (ref 80.0–100.0)
Platelets: 354 10*3/uL (ref 150–400)
RBC: 4.81 MIL/uL (ref 4.22–5.81)
RDW: 13 % (ref 11.5–15.5)
WBC: 5.6 10*3/uL (ref 4.0–10.5)
nRBC: 0 % (ref 0.0–0.2)

## 2019-07-24 LAB — RAPID URINE DRUG SCREEN, HOSP PERFORMED
Amphetamines: NOT DETECTED
Barbiturates: NOT DETECTED
Benzodiazepines: NOT DETECTED
Cocaine: NOT DETECTED
Opiates: NOT DETECTED
Tetrahydrocannabinol: NOT DETECTED

## 2019-07-24 LAB — COMPREHENSIVE METABOLIC PANEL
ALT: 40 U/L (ref 0–44)
AST: 31 U/L (ref 15–41)
Albumin: 3.9 g/dL (ref 3.5–5.0)
Alkaline Phosphatase: 46 U/L (ref 38–126)
Anion gap: 12 (ref 5–15)
BUN: 10 mg/dL (ref 6–20)
CO2: 21 mmol/L — ABNORMAL LOW (ref 22–32)
Calcium: 8.8 mg/dL — ABNORMAL LOW (ref 8.9–10.3)
Chloride: 104 mmol/L (ref 98–111)
Creatinine, Ser: 1.01 mg/dL (ref 0.61–1.24)
GFR calc Af Amer: >60 mL/min (ref >60)
GFR calc non Af Amer: >60 mL/min (ref >60)
Glucose, Bld: 90 mg/dL (ref 70–99)
Potassium: 3.8 mmol/L (ref 3.5–5.1)
Sodium: 137 mmol/L (ref 135–145)
Total Bilirubin: 0.7 mg/dL (ref 0.3–1.2)
Total Protein: 7.3 g/dL (ref 6.5–8.1)

## 2019-07-24 LAB — ETHANOL: Alcohol, Ethyl (B): 116 mg/dL — ABNORMAL HIGH (ref <10)

## 2019-07-24 MED ORDER — RISPERIDONE 3 MG PO TABS
3.0000 mg | ORAL_TABLET | Freq: Every day | ORAL | Status: DC
  Filled 2019-07-24: qty 1

## 2019-07-24 MED ORDER — RISPERIDONE 1 MG PO TABS
1.0000 mg | ORAL_TABLET | Freq: Every day | ORAL | Status: DC
  Administered 2019-07-24: 15:00:00 1 mg via ORAL
  Filled 2019-07-24: qty 1

## 2019-07-24 NOTE — ED Provider Notes (Signed)
[name]   CSN: [id number] Arrival date & time: 07/24/19  [id number]     History   Chief Complaint Chief Complaint  Patient presents with  . Medical Clearance    HPI Cole Barrett is a 33 y.o. male.     Patient is a 33 year old male with past medical history of paranoia and schizophrenia, homelessness.  He presents here for evaluation of hearing voices.  He tells me these voices are telling him that they are going to harm him.  He denies any suicidal or homicidal ideation.  Patient well-known to the emergency department for frequent visits.  He was just here and discharged several days ago.  The history is provided by the patient.    Past Medical History:  Diagnosis Date  . Homelessness   . Hypercholesterolemia   . Mental disorder   . Paranoid behavior (HCC)   . PE (pulmonary embolism)   . Schizophrenia [facility](HCC)     Patient Active Problem List   Diagnosis Date Noted  . Schizoaffective disorder, bipolar type (HCC) 01/19/2019  . Alcohol abuse   . Auditory hallucinations   . Alcohol abuse with alcohol-induced mood disorder (HCC) 09/08/2016  . Homelessness 09/03/2016  . Person feigning illness 09/03/2016  . 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 Cannabis use disorder, mild, abuse 08/29/2016  . Pulmonary emboli (HCC) 07/13/2016  . Adjustment disorder with emotional disturbance 03/12/2014  . 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 Acute left flank pain 01/02/2014  . Pulmonary embolism, bilateral (HCC) 01/02/2014    No past surgical history on file.      Home Medications    Prior to Admission medications   Medication Sig Start Date End Date Taking? Authorizing Provider  risperiDONE (RISPERDAL) 1 MG tablet Take 1 tablet (1 mg total) by mouth daily. Take 1 mg every morning Patient taking differently: Take 1 mg by mouth daily.  05/14/19   Aviva KluverMurray, Alyssa B, PA-C  risperiDONE (RISPERDAL) 3  MG tablet Take 1 tablet (3 mg total) by mouth at bedtime. 05/14/19   Elisha PonderMurray, Alyssa B, PA-C    Family History Family History  Problem Relation Age of Onset  . Hypertension Mother     Social History Social History   Tobacco Use  . Smoking status: Current Every Day Smoker    Packs/day: 0.50    Types: Cigarettes  . Smokeless tobacco: Never Used  Substance Use Topics  . Alcohol use: Yes    Comment: Last drink: this evening   . Drug use: Yes    Types: Marijuana    Comment: Last used: 2 months ago      Allergies   Haldol [haloperidol]   Review of Systems Review of Systems  All other systems reviewed and are negative.    Physical Exam Updated Vital Signs BP 133/81 (BP Location: Right Arm)   Pulse 83   Temp 98.1 F (36.7 C) (Oral)   Resp 16   SpO2 92%   Physical Exam Vitals signs and nursing note reviewed.  Constitutional:      General: He is not in acute distress.    Appearance: He is well-developed. He is not diaphoretic.  HENT:     Head: Normocephalic and atraumatic.  Neck:     Musculoskeletal: Normal range of motion and neck supple.  Cardiovascular:     Rate and Rhythm: Normal rate and regular rhythm.     Heart sounds: No murmur. No friction rub.  Pulmonary:  Effort: Pulmonary effort is normal. No respiratory distress.     Breath sounds: Normal breath sounds. No wheezing or rales.  Abdominal:     General: Bowel sounds are normal. There is no distension.     Palpations: Abdomen is soft.     Tenderness: There is no abdominal tenderness.  Musculoskeletal: Normal range of motion.  Skin:    General: Skin is warm and dry.  Neurological:     Mental Status: He is alert and oriented to person, place, and time.     Coordination: Coordination normal.      ED Treatments / Results  Labs (all labs ordered are listed, but only abnormal results are displayed) Labs Reviewed  COMPREHENSIVE METABOLIC PANEL - Abnormal; Notable for the following components:       Result Value   CO2 21 (*)    Calcium 8.8 (*)    All other components within normal limits  ETHANOL - Abnormal; Notable for the following components:   Alcohol, Ethyl (B) 116 (*)    All other components within normal limits  CBC  RAPID URINE DRUG SCREEN, HOSP PERFORMED    EKG None  Radiology No results found.  Procedures Procedures (including critical care time)  Medications Ordered in ED Medications - No data to display   Initial Impression / Assessment and Plan / ED Course  I have reviewed the triage vital signs and the nursing notes.  Pertinent labs & imaging results that were available during my care of the patient were reviewed by me and considered in my medical decision making (see chart for details).  Patient well-known to the emergency department for schizophrenia/hallucinations presenting with complaints of hearing voices.  He states that they are telling him to harm himself.  Patient was recently seen for similar complaints.  He was prescribed medication which he apparently has not filled.  TTS will be consulted to assist in the final disposition.  Final Clinical Impressions(s) / ED Diagnoses   Final diagnoses:  None    ED Discharge Orders    None       Veryl Speak, MD 07/24/19 [phone]

## 2019-07-24 NOTE — ED Triage Notes (Signed)
Per pt he has been hearing voices today that are telling him to hurt himself. Pt said he was not able get his medication today. Pt is cooperative.

## 2019-07-24 NOTE — BH Assessment (Signed)
Tele Assessment Note   Patient Name: Cole Barrett MRN: [id number] Referring Physician: Dr. Juleen ChinaKohut Location of Patient: [facility] Location of Provider: Behavioral Health TTS Department  Cole Barrett is an 33 y.o. male. Pt denies SI/HI. Pt reports AH. Pt denies previous SI attempts. Pt states he came to the ED due to a headache. Pt is also seeking mental health medication. Per Pt [facility]. Pt denies SA. Pt has been seen multiple times for the same.  Cole FillersLaShunda, NP recommends D/C and follow-up with [facility].  Diagnosis:  F20.9 Schizophrenia  Past Medical History:  Past Medical History:  Diagnosis Date  . Homelessness   . Hypercholesterolemia   . Mental disorder   . Paranoid behavior (H[facility])   . PE (pulmonary embolism)   . Schizophrenia ([facility])     No past surgical history on file.  Family History:  Family History  Problem Relation Age of Onset  . Hypertension Mother     Social History:  reports that he has been smoking cigarettes. He has been smoking about 0.50 packs per day. He has never used smokeless tobacco. He reports current alcohol use. He reports current drug use. Drug: Marijuana.  Additional Social History:  Alcohol / Drug Use Pain Medications: please see mar Prescriptions: please see mar Over the Counter: please see mar History of alcohol / drug use?: No history of alcohol / drug abuse  CIWA: CIWA-Ar BP: 117/74 Pulse Rate: 70 COWS:    Allergies:  Allergies  Allergen Reactions  . Haldol [Haloperidol] Shortness Of Breath    Home Medications: (Not in a hospital admission)   OB/GYN Status:  No LMP for male patient.  General Assessment Data [facility] Assessment: In system Is this a Tele or Face-to-Face Assessment?: Tele Assessment Is this an Initial Assessment or a Re-assessment for this encounter?: Initial Assessment Patient Accompanied by:: N/A Language Other than  English: No Living Arrangements: Other (Comment) What gender do you identify as?: Male Marital status: Single Maiden name: NA Pregnancy Status: No Living Arrangements: Alone Can pt return to current living arrangement?: Yes Admission Status: Voluntary Is patient capable of signing voluntary admission?: Yes Referral Source: Self/Family/Friend Insurance type: SP     Crisis Care Plan Living Arrangements: Alone Legal Guardian: Other: Name of Psychiatrist: [facility] Name of Therapist: None  Education Status Is patient currently in school?: No Is the patient employed, unemployed or receiving disability?: Unemployed  Risk to self with the past 6 months Suicidal Ideation: No Has patient been a risk to self within the past 6 months prior to admission? : No Suicidal Intent: No Has patient had any suicidal intent within the past 6 months prior to admission? : No Is patient at risk for suicide?: No Suicidal Plan?: No Has patient had any suicidal plan within the past 6 months prior to admission? : No Access to Means: No What has been your use of drugs/alcohol within the last 12 months?: Pt denies Previous Attempts/Gestures: No How many times?: 0 Other Self Harm Risks: NA Triggers for Past Attempts: None known Intentional Self Injurious Behavior: None Family Suicide History: No Recent stressful life event(s): Other (Comment) Persecutory voices/beliefs?: Yes Depression: No Depression Symptoms: Tearfulness, Isolating, Fatigue, Loss of interest in usual pleasures, Feeling worthless/self pity, Feeling angry/irritable Substance abuse history and/or treatment for substance abuse?: Yes Suicide prevention information given to non-admitted patients: Not applicable  Risk to Others within the past 6 months Homicidal  Ideation: No Does patient have any lifetime risk of violence toward others beyond the six months prior to admission? : No Thoughts of Harm to Others: No Current Homicidal Intent:  No Current Homicidal Plan: No Access to Homicidal Means: No Identified Victim: NA History of harm to others?: No Assessment of Violence: None Noted Violent Behavior Description: NA Does patient have access to weapons?: No Criminal Charges Pending?: No Describe Pending Criminal Charges: NA Does patient have a court date: No Is patient on probation?: No  Psychosis Hallucinations: Auditory Delusions: None noted  Mental Status Report Appearance/Hygiene: Unremarkable Eye Contact: Fair Motor Activity: Freedom of movement Speech: Logical/coherent Level of Consciousness: Quiet/awake Mood: Anxious Affect: Anxious Anxiety Level: Minimal Thought Processes: Coherent, Relevant Judgement: Unimpaired Orientation: Person, Place, Time, Situation Obsessive Compulsive Thoughts/Behaviors: None  Cognitive Functioning Concentration: Normal Memory: Recent Intact, Remote Intact Is patient IDD: No Insight: Poor Impulse Control: Fair Appetite: Good Have you had any weight changes? : No Change Sleep: No Change Total Hours of Sleep: 8 Vegetative Symptoms: None  ADLScreening [facility] Assessment Services) Patient's cognitive ability adequate to safely complete daily activities?: Yes Patient able to express need for assistance with ADLs?: Yes Independently performs ADLs?: Yes (appropriate for developmental age)  Prior Inpatient Therapy Prior Inpatient Therapy: Yes Prior Therapy Dates: 2019, 2018 Prior Therapy Facilty[facility]Provider([facility]): [facility] Reason for Treatment: hearing voices  Prior Outpatient Therapy Prior Outpatient Therapy: Yes Prior Therapy Dates: ongoing Prior Therapy Facilty/Provider(s): [facility] Reason for Treatment: med management Does patient have an ACCT team?: No Does patient have Intensive In-House Services?  : No Does patient have [facility] services? : Yes Does patient have P4CC services?: No  ADL Screening (condition at time of admission) Patient's cognitive ability adequate  to safely complete daily activities?: Yes Is the patient deaf or have difficulty hearing?: No Does the patient have difficulty seeing, even when wearing glasses/contacts?: No Does the patient have difficulty concentrating, remembering, or making decisions?: No Patient able to express need for assistance with ADLs?: Yes Does the patient have difficulty dressing or bathing?: No Independently performs ADLs?: Yes (appropriate for developmental age) Does the patient have difficulty walking or climbing stairs?: No       Abuse/Neglect Assessment (Assessment to be complete while patient is alone) Abuse/Neglect Assessment Can Be Completed: Yes Physical Abuse: Denies Verbal Abuse: Denies Sexual Abuse: Denies Exploitation of patient/patient's resources: Denies     Regulatory affairs officer (For Healthcare) Does Patient Have a Medical Advance Directive?: No Would patient like information on creating a medical advance directive?: No - Patient declined          Disposition:  Disposition Initial Assessment Completed for this Encounter: Yes  This service was provided via telemedicine using a 2-way, interactive audio and video technology.  Names of all persons participating in this telemedicine service and their role in this encounter. Name: [id number] Role: NP  Name:  Role:   Name:  Role:   Name:  Role:     Cyndia Bent 07/24/2019 11:47 AM

## 2019-07-24 NOTE — ED Notes (Signed)
Pt states he hears voices that are going to kill him. Pt denies however SI/ HI ideation. Pt states it is only voices. Pt snoring soundly at this time.

## 2019-07-24 NOTE — ED Notes (Signed)
1 bag of patient belongings inventoried and placed in locker #13

## 2019-07-30 ENCOUNTER — Encounter (HOSPITAL_COMMUNITY): Payer: Self-pay | Admitting: Emergency Medicine

## 2019-07-30 ENCOUNTER — Emergency Department (HOSPITAL_COMMUNITY)
Admission: EM | Admit: 2019-07-30 | Discharge: 2019-07-31 | Disposition: A | Discharge status: Home / Self Care | Payer: Self-pay | Attending: Emergency Medicine | Admitting: Emergency Medicine

## 2019-07-30 ENCOUNTER — Other Ambulatory Visit: Payer: Self-pay

## 2019-07-30 DIAGNOSIS — Z590 Homelessness: Secondary | ICD-10-CM | POA: Insufficient documentation

## 2019-07-30 DIAGNOSIS — R443 Hallucinations, unspecified: Secondary | ICD-10-CM

## 2019-07-30 DIAGNOSIS — Z79899 Other long term (current) drug therapy: Secondary | ICD-10-CM | POA: Insufficient documentation

## 2019-07-30 DIAGNOSIS — R440 Auditory hallucinations: Secondary | ICD-10-CM | POA: Insufficient documentation

## 2019-07-30 DIAGNOSIS — F200 Paranoid schizophrenia: Secondary | ICD-10-CM | POA: Insufficient documentation

## 2019-07-30 DIAGNOSIS — Z86711 Personal history of pulmonary embolism: Secondary | ICD-10-CM | POA: Insufficient documentation

## 2019-07-30 LAB — COMPREHENSIVE METABOLIC PANEL
ALT: 46 U/L — ABNORMAL HIGH (ref 0–44)
AST: 27 U/L (ref 15–41)
Albumin: 3.9 g/dL (ref 3.5–5.0)
Alkaline Phosphatase: 42 U/L (ref 38–126)
Anion gap: 12 (ref 5–15)
BUN: 8 mg/dL (ref 6–20)
CO2: 22 mmol/L (ref 22–32)
Calcium: 9 mg/dL (ref 8.9–10.3)
Chloride: 106 mmol/L (ref 98–111)
Creatinine, Ser: 1.13 mg/dL (ref 0.61–1.24)
GFR calc Af Amer: >60 mL/min (ref >60)
GFR calc non Af Amer: >60 mL/min (ref >60)
Glucose, Bld: 105 mg/dL — ABNORMAL HIGH (ref 70–99)
Potassium: 3.8 mmol/L (ref 3.5–5.1)
Sodium: 140 mmol/L (ref 135–145)
Total Bilirubin: 0.4 mg/dL (ref 0.3–1.2)
Total Protein: 7.1 g/dL (ref 6.5–8.1)

## 2019-07-30 LAB — SALICYLATE LEVEL: Salicylate Lvl: <7 mg/dL (ref 2.8–30.0)

## 2019-07-30 LAB — RAPID URINE DRUG SCREEN, HOSP PERFORMED
Amphetamines: NOT DETECTED
Barbiturates: NOT DETECTED
Benzodiazepines: NOT DETECTED
Cocaine: NOT DETECTED
Opiates: NOT DETECTED
Tetrahydrocannabinol: NOT DETECTED

## 2019-07-30 LAB — ACETAMINOPHEN LEVEL: Acetaminophen (Tylenol), Serum: <10 ug/mL — ABNORMAL LOW (ref 10–30)

## 2019-07-30 LAB — CBC
HCT: 40.5 % (ref 39.0–52.0)
Hemoglobin: 13 g/dL (ref 13.0–17.0)
MCH: 28.1 pg (ref 26.0–34.0)
MCHC: 32.1 g/dL (ref 30.0–36.0)
MCV: 87.7 fL (ref 80.0–100.0)
Platelets: 314 10*3/uL (ref 150–400)
RBC: 4.62 MIL/uL (ref 4.22–5.81)
RDW: 13.2 % (ref 11.5–15.5)
WBC: 5.3 10*3/uL (ref 4.0–10.5)
nRBC: 0 % (ref 0.0–0.2)

## 2019-07-30 LAB — ETHANOL: Alcohol, Ethyl (B): 16 mg/dL — ABNORMAL HIGH (ref <10)

## 2019-07-30 NOTE — ED Triage Notes (Signed)
Pt reports hearing voices telling him to kill himself, denies SI or HI. Pt was seen on the 8/26 for same. Pt calm in triage.

## 2019-07-30 NOTE — ED Provider Notes (Signed)
[facility] EMERGENCY DEPARTMENT Provider Note   CSN: [id number] Arrival date & time: 07/30/19  2055     History   Chief Complaint Chief Complaint  Patient presents with  . Medical Clearance    HPI Cole Barrett is a 33 y.o. male.     HPI  33 year old male presents with recurrent hallucinations.  He states the voices in his head are telling him they will kill him.  This is a recurrent issue for him.  He endorses compliance with his Risperdal.  No other complaints including no fever or headache. Had one beer earlier today.  Past Medical History:  Diagnosis Date  . Homelessness   . Hypercholesterolemia   . Mental disorder   . Paranoid behavior ([place])   . PE (pulmonary embolism)   . Schizophrenia [facility])     Patient Active Problem List   Diagnosis Date Noted  . Schizoaffective disorder, bipolar type ([place]) 01/19/2019  . Alcohol abuse   . Auditory hallucinations   . Alcohol abuse with alcohol-induced mood disorder ([place]) 09/08/2016  . Homelessness 09/03/2016  . Person feigning illness 09/03/2016  . Brief psychotic disorder ([place]) 08/29/2016  . Cannabis use disorder, mild, abuse 08/29/2016  . Pulmonary emboli ([place]) 07/13/2016  . Adjustment disorder with emotional disturbance 03/12/2014  . Pulmonary embolism and infarction ([place]) 01/02/2014  . Acute left flank pain 01/02/2014  . Pulmonary embolism, bilateral ([place]) 01/02/2014    History reviewed. No pertinent surgical history.      Home Medications    Prior to Admission medications   Medication Sig Start Date End Date Taking? Authorizing Provider  risperiDONE (RISPERDAL) 1 MG tablet Take 1 tablet (1 mg total) by mouth daily. Take 1 mg every morning Patient taking differently: Take 1 mg by mouth daily.  05/14/19   Langston Masker B, PA-C  risperiDONE (RISPERDAL) 3 MG tablet Take 1 tablet (3 mg total) by mouth at bedtime. 05/14/19   Albesa Seen, PA-C    Family History Family History   Problem Relation Age of Onset  . Hypertension Mother     Social History Social History   Tobacco Use  . Smoking status: Current Every Day Smoker    Packs/day: 0.50    Types: Cigarettes  . Smokeless tobacco: Never Used  Substance Use Topics  . Alcohol use: Yes    Comment: Last drink: this evening   . Drug use: Yes    Types: Marijuana    Comment: Last used: 2 months ago      Allergies   Haldol [haloperidol]   Review of Systems Review of Systems  Constitutional: Negative for fever.  Cardiovascular: Negative for chest pain.  Neurological: Negative for headaches.  Psychiatric/Behavioral: Positive for hallucinations.  All other systems reviewed and are negative.    Physical Exam Updated Vital Signs BP (!) 145/81 (BP Location: Left Arm)   Pulse (!) 102   Temp 98.7 F (37.1 C) (Oral)   Resp 18   SpO2 98%   Physical Exam Vitals signs and nursing note reviewed.  Constitutional:      General: He is not in acute distress.    Appearance: He is well-developed. He is not ill-appearing or diaphoretic.  HENT:     Head: Normocephalic and atraumatic.     Right Ear: External ear normal.     Left Ear: External ear normal.     Nose: Nose normal.  Eyes:     General:        Right eye:  No discharge.        Left eye: No discharge.  Neck:     Musculoskeletal: Neck supple.  Cardiovascular:     Rate and Rhythm: Normal rate and regular rhythm.     Heart sounds: Normal heart sounds.  Pulmonary:     Effort: Pulmonary effort is normal.     Breath sounds: Normal breath sounds.  Abdominal:     Palpations: Abdomen is soft.     Tenderness: There is no abdominal tenderness.  Skin:    General: Skin is warm and dry.  Neurological:     Mental Status: He is alert.  Psychiatric:        Mood and Affect: Mood is anxious.      ED Treatments / Results  Labs (all labs ordered are listed, but only abnormal results are displayed) Labs Reviewed  CBC  COMPREHENSIVE METABOLIC PANEL   ETHANOL  SALICYLATE LEVEL  ACETAMINOPHEN LEVEL  RAPID URINE DRUG SCREEN, HOSP PERFORMED    EKG None  Radiology No results found.  Procedures Procedures (including critical care time)  Medications Ordered in ED Medications - No data to display   Initial Impression / Assessment and Plan / ED Course  I have reviewed the triage vital signs and the nursing notes.  Pertinent labs & imaging results that were available during my care of the patient were reviewed by me and considered in my medical decision making (see chart for details).        Patient appears to have chronic hallucinations.  Screening labs and TTS were already started prior to me seeing the patient.  Otherwise I think he is able to be discharged as this is a chronic issue.  Assuming his labs look okay and TTS clears patient, I think he would be amenable to discharge.  Final Clinical Impressions(s) / ED Diagnoses   Final diagnoses:  None    ED Discharge Orders    None       Pricilla LovelessGoldston, Anyiah Coverdale, MD 07/30/19 2307

## 2019-07-30 NOTE — BH Assessment (Signed)
Tele Assessment Note   Patient Name: Cole Barrett MRN: [id number] Referring Physician: Dr. Sherwood Gambler Location of Patient: [facility] Location of Provider: [place] is an 33 y.o. male presenting with auditory hallucinations. Patient reported voices are telling him they will kill him. Patient reported the voices continued to get very loud and persistent today. Patient reported hearing voices during assessment. Patient reported his medications were not working. Patient reported inability to follow up with [facility] due to new steps/protocal due to pandemic in order for patient to be seen. Patient was seen in the ED on 07/24/19 and has had multiple ED visits. Patient reported no support or collateral contact. Patient denied SI, HI and substance abuse.  Diagnosis: Schizophrenia  Past Medical History:  Past Medical History:  Diagnosis Date  . Homelessness   . Hypercholesterolemia   . Mental disorder   . Paranoid behavior ([place])   . PE (pulmonary embolism)   . Schizophrenia ([place])     History reviewed. No pertinent surgical history.  Family History:  Family History  Problem Relation Age of Onset  . Hypertension Mother     Social History:  reports that he has been smoking cigarettes. He has been smoking about 0.50 packs per day. He has never used smokeless tobacco. He reports current alcohol use. He reports current drug use. Drug: Marijuana.  Additional Social History:  Alcohol / Drug Use Pain Medications: see MAR Prescriptions: see MAR Over the Counter: see MAR  CIWA: CIWA-Ar BP: (!) 145/81 Pulse Rate: (!) 102 COWS:    Allergies:  Allergies  Allergen Reactions  . Haldol [Haloperidol] Shortness Of Breath    Home Medications: (Not in a hospital admission)   OB/GYN Status:  No LMP for male patient.  General Assessment Data Location of Assessment: [facility] ED TTS Assessment: In system Is this a Tele or Face-to-Face  Assessment?: Tele Assessment Is this an Initial Assessment or a Re-assessment for this encounter?: Initial Assessment Patient Accompanied by:: N/A Language Other than English: No Living Arrangements: Other (Comment) What gender do you identify as?: Male Marital status: Single Living Arrangements: Alone Can pt return to current living arrangement?: Yes Admission Status: Voluntary Is patient capable of signing voluntary admission?: Yes Referral Source: Self/Family/Friend     Crisis Care Plan Living Arrangements: Alone Legal Guardian: (self) Name of Psychiatrist: [place] Name of Therapist: None  Education Status Is patient currently in school?: No Is the patient employed, unemployed or receiving disability?: Unemployed  Risk to self with the past 6 months Suicidal Ideation: No Has patient been a risk to self within the past 6 months prior to admission? : No Suicidal Intent: No Has patient had any suicidal intent within the past 6 months prior to admission? : No Is patient at risk for suicide?: No Suicidal Plan?: No Has patient had any suicidal plan within the past 6 months prior to admission? : No Access to Means: No What has been your use of drugs/alcohol within the last 12 months?: (denied) Previous Attempts/Gestures: No How many times?: (0) Other Self Harm Risks: (none) Triggers for Past Attempts: None known Intentional Self Injurious Behavior: None Family Suicide History: No Recent stressful life event(s): (voices in my head) Persecutory voices/beliefs?: Yes Depression: No Depression Symptoms: Insomnia, Tearfulness, Isolating, Fatigue, Guilt, Loss of interest in usual pleasures, Feeling worthless/self pity Substance abuse history and/or treatment for substance abuse?: No Suicide prevention information given to non-admitted patients: Not applicable  Risk to Others within the past 6  months Homicidal Ideation: No Does patient have any lifetime risk of violence toward  others beyond the six months prior to admission? : No Thoughts of Harm to Others: No Current Homicidal Intent: No Current Homicidal Plan: No Access to Homicidal Means: No Identified Victim: (n/a) History of harm to others?: No Assessment of Violence: None Noted Violent Behavior Description: (none) Does patient have access to weapons?: No Criminal Charges Pending?: No Describe Pending Criminal Charges: (n/a) Does patient have a court date: No Court Date: (none) Is patient on probation?: No  Psychosis Hallucinations: Auditory Delusions: None noted  Mental Status Report Appearance/Hygiene: Unremarkable Eye Contact: Fair Motor Activity: Freedom of movement Speech: Logical/coherent Level of Consciousness: Quiet/awake Mood: Anxious Affect: Anxious Anxiety Level: Moderate Thought Processes: Coherent, Relevant, Irrelevant, Circumstantial Judgement: Partial Orientation: Person, Place, Time, Situation Obsessive Compulsive Thoughts/Behaviors: None  Cognitive Functioning Concentration: Normal Memory: Recent Intact Is patient IDD: No Insight: Poor Impulse Control: Poor Appetite: Good Have you had any weight changes? : No Change Sleep: No Change Total Hours of Sleep: (8) Vegetative Symptoms: None  ADLScreening [facility] Assessment Services) Patient's cognitive ability adequate to safely complete daily activities?: Yes Patient able to express need for assistance with ADLs?: Yes Independently performs ADLs?: Yes (appropriate for developmental age)  Prior Inpatient Therapy Prior Inpatient Therapy: Yes Prior Therapy Dates: 2019, 2018 Prior Therapy Facilty/Provider(s): [facility], [facility] Reason for Treatment: hearing voices  Prior Outpatient Therapy Prior Outpatient Therapy: Yes Prior Therapy Dates: ongoing Prior Therapy Facilty/Provider(s): [facility] Reason for Treatment: med management Does patient have an ACCT team?: No Does patient have Intensive In-House Services?  : No Does patient  have [facility] services? : Yes Does patient have P4CC services?: No  ADL Screening (condition at time of admission) Patient's cognitive ability adequate to safely complete daily activities?: Yes Patient able to express need for assistance with ADLs?: Yes Independently performs ADLs?: Yes (appropriate for developmental age)  Merchant navy officerAdvance Directives (For Healthcare) Does Patient Have a Medical Advance Directive?: No Would patient like information on creating a medical advance directive?: No - Patient declined   Disposition:  Disposition Initial Assessment Completed for this Encounter: Yes  Sherron Flemingsashawn Dixon, NP, recommends overnight observation for safety and stabilization with psych re-evaluation in the a.m.  This service was provided via telemedicine using a 2-way, interactive audio and video technology.  Names of all persons participating in this telemedicine service and their role in this encounter. Name: Loretha BrasilChristopher Portela Role: Patient  Name: Al CorpusLatisha Ginnette Gates Role: TTS Clinician  Name:  Role:   Name:  Role:     Burnetta SabinLatisha D Antoria Lanza 07/30/2019 10:42 PM

## 2019-07-31 DIAGNOSIS — F200 Paranoid schizophrenia: Secondary | ICD-10-CM

## 2019-07-31 DIAGNOSIS — R440 Auditory hallucinations: Secondary | ICD-10-CM

## 2019-07-31 NOTE — ED Notes (Signed)
Regular Diet was ordered for Lunch. Patient was given a Snack and Drink. 

## 2019-07-31 NOTE — Discharge Instructions (Addendum)
Follow-up with your doctors as needed. 

## 2019-07-31 NOTE — ED Notes (Signed)
Meal given to pt.

## 2019-07-31 NOTE — ED Notes (Signed)
Ordered bfast 

## 2019-07-31 NOTE — Consult Note (Addendum)
Telepsych Consultation   Reason for Consult:  [place]  Referring Physician:  EDP  Location of Patient: [facility] ED 057C Location of Provider: [facility]  Patient Identification: Cole Barrett MRN:  [id number] Principal Diagnosis: <principal problem not specified> Diagnosis:  Active Problems:   * No active hospital problems. *   Total Time spent with patient: 15 minutes  Subjective: " I was hearing voices last night. "   HPI:  Cole Barrett is an 33 y.o. male presenting with auditory hallucinations. Patient reported voices are telling him they will kill him. Patient reported the voices continued to get very loud and persistent today. Patient reported hearing voices during assessment. Patient reported his medications were not working. Patient reported inability to follow up with [facility] due to new steps/protocal due to pandemic in order for patient to be seen. Patient was seen in the ED on 07/24/19 and has had multiple ED visits. Patient reported no support or collateral contact. Patient denied SI, HI and substance abuse.    Psychiatric evaluation: Cole Barrett is an 32 y.o [place] male with a psychiatric history that includes Schizoaffective disorder, brief psychotic disorder, substance abuse per chart review. He often presents to the ED with reports of auditory  hallucinations. Per chart review, he most recent visits to the ED was; 07/20/2019, 06/12/2019, 07/05/2019, 06/07/2019, 05/13/2019 and the list continued. He presentation on arrival to the ED is similar to current presentation as he endorses auditory hallucinations. Today, he continues to endorse auditory hallucinations which seem to be chronic. He states the voices are telling him that they are going to kill him which is normally how the voices are described. He denies other psychosis. He does not appear internally preoccupied. He denies active or passive suicidal thoughts or homicidal ideas. He  reports a history of alcohol use reporting drinking a few beers per week. He denies other substance abuse or use. Reports the last use of alcohol was yesterday and he reports drinking a 40 oz beer. He does not appear to be in a withdrawal state. He is on Risperdal which he reports is helpful and he receives outpatient psychiatric treatment through [facility]. He states that he has not made a recent appointment with [facility]. He reports he is no longer homeless and has his own apartment.  Based off my evaluation, he does not appear in an acute distress. There is no  evidence of imminent risk to self or others at present as such, patient does not meet criteria for psychiatric inpatient admission and he is psychiatrically cleared.  Reccommended to follow-up with [facility] as noted below. ..     Past Psychiatric History: Schizoaffective disorder,   Risk to Self: Suicidal Ideation: No Suicidal Intent: No Is patient at risk for suicide?: No Suicidal Plan?: No Access to Means: No What has been your use of drugs/alcohol within the last 12 months?: (denied) How many times?: (0) Other Self Harm Risks: (none) Triggers for Past Attempts: None known Intentional Self Injurious Behavior: None Risk to Others: Homicidal Ideation: No Thoughts of Harm to Others: No Current Homicidal Intent: No Current Homicidal Plan: No Access to Homicidal Means: No Identified Victim: (n/a) History of harm to others?: No Assessment of Violence: None Noted Violent Behavior Description: (none) Does patient have access to weapons?: No Criminal Charges Pending?: No Describe Pending Criminal Charges: (n/a) Does patient have a court date: No Court Date: (none) Prior Inpatient Therapy: Prior Inpatient Therapy: Yes Prior Therapy Dates: 2019, 2018 Prior Therapy  Facilty/Provider(s): [facility], [facility] Reason for Treatment: hearing voices Prior Outpatient Therapy: Prior Outpatient Therapy: Yes Prior Therapy Dates: ongoing Prior Therapy  Facilty/Provider(s): [facility] Reason for Treatment: med management Does patient have an ACCT team?: No Does patient have Intensive In-House Services?  : No Does patient have [facility] services? : Yes Does patient have P4CC services?: No  Past Medical History:  Past Medical History:  Diagnosis Date  . Homelessness   . Hypercholesterolemia   . Mental disorder   . Paranoid behavior (HCC)   . PE (pulmonary embolism)   . Schizophrenia (HCC)    History reviewed. No pertinent surgical history. Family History:  Family History  Problem Relation Age of Onset  . Hypertension Mother    Family Psychiatric  History: noncontributory. Social History:  Social History   Substance and Sexual Activity  Alcohol Use Yes   Comment: Last drink: this evening      Social History   Substance and Sexual Activity  Drug Use Yes  . Types: Marijuana   Comment: Last used: 2 months ago     Social History   Socioeconomic History  . Marital status: Single    Spouse name: Not on file  . Number of children: Not on file  . Years of education: Not on file  . Highest education level: Not on file  Occupational History  . Occupation: Unemployed  Social Needs  . Financial resource strain: Not on file  . Food insecurity    Worry: Not on file    Inability: Not on file  . Transportation needs    Medical: Not on file    Non-medical: Not on file  Tobacco Use  . Smoking status: Current Every Day Smoker    Packs/day: 0.50    Types: Cigarettes  . Smokeless tobacco: Never Used  Substance and Sexual Activity  . Alcohol use: Yes    Comment: Last drink: this evening   . Drug use: Yes    Types: Marijuana    Comment: Last used: 2 months ago   . Sexual activity: Never  Lifestyle  . Physical activity    Days per week: Not on file    Minutes per session: Not on file  . Stress: Not on file  Relationships  . Social Musicianconnections    Talks on phone: Not on file    Gets together: Not on file    Attends  religious service: Not on file    Active member of club or organization: Not on file    Attends meetings of clubs or organizations: Not on file    Relationship status: Not on file  Other Topics Concern  . Not on file  Social History Narrative   ** Merged History Encounter **       Additional Social History:    Allergies:   Allergies  Allergen Reactions  . Haldol [Haloperidol] Shortness Of Breath    Labs:  Results for orders placed or performed during the hospital encounter of 07/30/19 (from the past 48 hour(s))  Comprehensive metabolic panel     Status: Abnormal   Collection Time: 07/30/19  9:43 PM  Result Value Ref Range   Sodium 140 135 - 145 mmol/L   Potassium 3.8 3.5 - 5.1 mmol/L   Chloride 106 98 - 111 mmol/L   CO2 22 22 - 32 mmol/L   Glucose, Bld 105 (H) 70 - 99 mg/dL   BUN 8 6 - 20 mg/dL   Creatinine, Ser 9.621.13 0.61 - 1.24 mg/dL  Calcium 9.0 8.9 - 10.3 mg/dL   Total Protein 7.1 6.5 - 8.1 g/dL   Albumin 3.9 3.5 - 5.0 g/dL   AST 27 15 - 41 U/L   ALT 46 (H) 0 - 44 U/L   Alkaline Phosphatase 42 38 - 126 U/L   Total Bilirubin 0.4 0.3 - 1.2 mg/dL   GFR calc non Af Amer >60 >60 mL/min   GFR calc Af Amer >60 >60 mL/min   Anion gap 12 5 - 15    Comment: Performed at [facility], [address] [place], [place] [address]  Ethanol     Status: Abnormal   Collection Time: 07/30/19  9:43 PM  Result Value Ref Range   Alcohol, Ethyl (B) 16 (H) <10 mg/dL    Comment: (NOTE) Lowest detectable limit for serum alcohol is 10 mg/dL. For medical purposes only. Performed at [facility], [address] [place], [place] [address]   Salicylate level     Status: None   Collection Time: 07/30/19  9:43 PM  Result Value Ref Range   Salicylate Lvl <7.0 2.8 - 30.0 mg/dL    Comment: Performed at [facility], [address] [place], [place] [address]  Acetaminophen level     Status: Abnormal   Collection Time: 07/30/19  9:43 PM  Result Value Ref Range    Acetaminophen (Tylenol), Serum <10 (L) 10 - 30 ug/mL    Comment: (NOTE) Therapeutic concentrations vary significantly. A range of 10-30 ug/mL  may be an effective concentration for many patients. However, some  are best treated at concentrations outside of this range. Acetaminophen concentrations >150 ug/mL at 4 hours after ingestion  and >50 ug/mL at 12 hours after ingestion are often associated with  toxic reactions. Performed at [facility], [address] [place], [place] [address]   cbc     Status: None   Collection Time: 07/30/19  9:43 PM  Result Value Ref Range   WBC 5.3 4.0 - 10.5 K/uL   RBC 4.62 4.22 - 5.81 MIL/uL   Hemoglobin 13.0 13.0 - 17.0 g/dL   HCT 30.840.5 65.739.0 - 84.652.0 %   MCV 87.7 80.0 - 100.0 fL   MCH 28.1 26.0 - 34.0 pg   MCHC 32.1 30.0 - 36.0 g/dL   RDW 96.213.2 95.211.5 - 84.115.5 %   Platelets 314 150 - 400 K/uL   nRBC 0.0 0.0 - 0.2 %    Comment: Performed at [facility], [address] [place], [place] [address]  Rapid urine drug screen (hospital performed)     Status: None   Collection Time: 07/30/19 10:13 PM  Result Value Ref Range   Opiates NONE DETECTED NONE DETECTED   Cocaine NONE DETECTED NONE DETECTED   Benzodiazepines NONE DETECTED NONE DETECTED   Amphetamines NONE DETECTED NONE DETECTED   Tetrahydrocannabinol NONE DETECTED NONE DETECTED   Barbiturates NONE DETECTED NONE DETECTED    Comment: (NOTE) DRUG SCREEN FOR MEDICAL PURPOSES ONLY.  IF CONFIRMATION IS NEEDED FOR ANY PURPOSE, NOTIFY LAB WITHIN 5 DAYS. LOWEST DETECTABLE LIMITS FOR URINE DRUG SCREEN Drug Class                     Cutoff (ng/mL) Amphetamine and metabolites    1000 Barbiturate and metabolites    200 Benzodiazepine                 200 Tricyclics and metabolites     300  Opiates and metabolites        300 Cocaine and metabolites        300 THC                            50 Performed at [facility], [address], [place], [place] [address]     Medications:  No  current facility-administered medications for this encounter.    Current Outpatient Medications  Medication Sig Dispense Refill  . risperiDONE (RISPERDAL) 1 MG tablet Take 1 tablet (1 mg total) by mouth daily. Take 1 mg every morning (Patient taking differently: Take 1 mg by mouth daily. ) 30 tablet 0  . risperiDONE (RISPERDAL) 3 MG tablet Take 1 tablet (3 mg total) by mouth at bedtime. 30 tablet 0    Musculoskeletal: Unable to access as evaluation via telepsych.  Psychiatric Specialty Exam: Physical Exam  Nursing note reviewed. Constitutional: He is oriented to person, place, and time.  Neurological: He is alert and oriented to person, place, and time.    Review of Systems  Psychiatric/Behavioral: Positive for hallucinations.  All other systems reviewed and are negative.   Blood pressure (!) 129/93, pulse 94, temperature 98.4 F (36.9 C), temperature source Oral, resp. rate 20, SpO2 100 %.There is no height or weight on file to calculate BMI.  General Appearance: Fairly Groomed  Eye Contact:  Good  Speech:  Clear and Coherent and Normal Rate  Volume:  Increased  Mood:  Euthymic  Affect:  Appropriate  Thought Process:  Coherent, Linear and Descriptions of Associations: Intact  Orientation:  Full (Time, Place, and Person)  Thought Content:  Hallucinations: Auditory  Suicidal Thoughts:  No  Homicidal Thoughts:  No  Memory:  Immediate;   Fair Recent;   Fair  Judgement:  Fair  Insight:  Fair  Psychomotor Activity:  Normal  Concentration:  Concentration: Fair and Attention Span: Fair  Recall:  [employer] of Knowledge:  Fair  Language:  Good  Akathisia:  Negative  Handed:  Right  AIMS (if indicated):     Assets:  Communication Skills Desire for Improvement Resilience  ADL's:  Intact  Cognition:  WNL  Sleep:        Treatment Plan Summary: Daily contact with patient to assess and evaluate symptoms and progress in treatment  Disposition: No evidence of imminent risk  to self or others at present.   Patient does not meet criteria for psychiatric inpatient admission. Reccommended to follow-up with [facility] for evaluation of ongoing hallucinations and medication adjustments as appropriate. Patient reports he will call [facility] once discharged .to make an appointment.    EDP Dr. Freida Busman updated on disposition.    This service was provided via telemedicine using a 2-way, interactive audio and video technology.  Names of all persons participating in this telemedicine service and their role in this encounter. Name: Denzil Magnuson  Role: FNP  Name: Loretha Brasil  Role: Patient    Denzil Magnuson, NP 07/31/2019 9:27 AM

## 2019-07-31 NOTE — ED Provider Notes (Signed)
Patient seen by psychiatry and cleared for discharge   Lacretia Leigh, MD 07/31/19 [phone]

## 2019-08-07 ENCOUNTER — Other Ambulatory Visit: Payer: Self-pay

## 2019-08-07 ENCOUNTER — Encounter (HOSPITAL_COMMUNITY): Payer: Self-pay | Admitting: Emergency Medicine

## 2019-08-07 ENCOUNTER — Emergency Department (HOSPITAL_COMMUNITY)
Admission: EM | Admit: 2019-08-07 | Discharge: 2019-08-08 | Disposition: A | Discharge status: Home / Self Care | Payer: Self-pay | Attending: Emergency Medicine | Admitting: Emergency Medicine

## 2019-08-07 DIAGNOSIS — Z590 Homelessness: Secondary | ICD-10-CM | POA: Insufficient documentation

## 2019-08-07 DIAGNOSIS — F209 Schizophrenia, unspecified: Secondary | ICD-10-CM | POA: Insufficient documentation

## 2019-08-07 DIAGNOSIS — R443 Hallucinations, unspecified: Secondary | ICD-10-CM

## 2019-08-07 DIAGNOSIS — F17210 Nicotine dependence, cigarettes, uncomplicated: Secondary | ICD-10-CM | POA: Insufficient documentation

## 2019-08-07 DIAGNOSIS — Z79899 Other long term (current) drug therapy: Secondary | ICD-10-CM | POA: Insufficient documentation

## 2019-08-07 LAB — CBC
HCT: 40.3 % (ref 39.0–52.0)
Hemoglobin: 12.9 g/dL — ABNORMAL LOW (ref 13.0–17.0)
MCH: 28.3 pg (ref 26.0–34.0)
MCHC: 32 g/dL (ref 30.0–36.0)
MCV: 88.4 fL (ref 80.0–100.0)
Platelets: 310 10*3/uL (ref 150–400)
RBC: 4.56 MIL/uL (ref 4.22–5.81)
RDW: 13 % (ref 11.5–15.5)
WBC: 4.7 10*3/uL (ref 4.0–10.5)
nRBC: 0 % (ref 0.0–0.2)

## 2019-08-07 LAB — COMPREHENSIVE METABOLIC PANEL
ALT: 42 U/L (ref 0–44)
AST: 29 U/L (ref 15–41)
Albumin: 4.2 g/dL (ref 3.5–5.0)
Alkaline Phosphatase: 44 U/L (ref 38–126)
Anion gap: 9 (ref 5–15)
BUN: 13 mg/dL (ref 6–20)
CO2: 25 mmol/L (ref 22–32)
Calcium: 8.7 mg/dL — ABNORMAL LOW (ref 8.9–10.3)
Chloride: 103 mmol/L (ref 98–111)
Creatinine, Ser: 1.02 mg/dL (ref 0.61–1.24)
GFR calc Af Amer: >60 mL/min (ref >60)
GFR calc non Af Amer: >60 mL/min (ref >60)
Glucose, Bld: 97 mg/dL (ref 70–99)
Potassium: 4 mmol/L (ref 3.5–5.1)
Sodium: 137 mmol/L (ref 135–145)
Total Bilirubin: 0.8 mg/dL (ref 0.3–1.2)
Total Protein: 7.8 g/dL (ref 6.5–8.1)

## 2019-08-07 LAB — ETHANOL: Alcohol, Ethyl (B): <10 mg/dL (ref <10)

## 2019-08-07 LAB — ACETAMINOPHEN LEVEL: Acetaminophen (Tylenol), Serum: <10 ug/mL — ABNORMAL LOW (ref 10–30)

## 2019-08-07 LAB — SALICYLATE LEVEL: Salicylate Lvl: <7 mg/dL (ref 2.8–30.0)

## 2019-08-07 MED ORDER — RISPERIDONE 1 MG PO TABS
1.0000 mg | ORAL_TABLET | Freq: Every day | ORAL | Status: DC
  Administered 2019-08-08: 1 mg via ORAL
  Filled 2019-08-07: qty 1

## 2019-08-07 MED ORDER — RISPERIDONE 2 MG PO TABS
3.0000 mg | ORAL_TABLET | Freq: Every day | ORAL | Status: DC
  Administered 2019-08-07: 3 mg via ORAL
  Filled 2019-08-07: qty 1

## 2019-08-07 NOTE — ED Notes (Signed)
Requested urine from patient. 

## 2019-08-07 NOTE — ED Notes (Signed)
Admitted to room 41 from triage with complaints of AH. He denies any thoughts to hurt self or others but says the voiced got bad tonight which when he was asked to clarify what bad meant, said the voices tell him they are going to kill him. Asked if he believed voices could kill him he said no. He is homeless. States he would like to go to somewhere from here to get help. States he takes Risperdal and has two different doses of it and ran out of one of the doses several days ago. Extender came in to further assess him. Waiting on orders.

## 2019-08-07 NOTE — ED Triage Notes (Signed)
Patient c/o auditory hallucinations worsening tonight. Denies SI/HI. Reports taking psych medications as prescribed.

## 2019-08-07 NOTE — ED Provider Notes (Signed)
[facility]-EMERGENCY DEPT Provider Note   CSN: [id number] Arrival date & time: 08/07/19  2131     History   Chief Complaint Chief Complaint  Patient presents with  . Hallucinations    HPI Cole Barrett is a 33 y.o. male.     Patient presents to the emergency department with a chief complaint of hallucinations.  He states that he has been having worsening hallucinations.  States that they are auditory.  States that he hears voices telling him that they are going to kill him.  He denies using any drugs or alcohol.  Denies any physical complaints.  Denies any other associated symptoms.  The history is provided by the patient. No language interpreter was used.    Past Medical History:  Diagnosis Date  . Homelessness   . Hypercholesterolemia   . Mental disorder   . Paranoid behavior (HCC)   . PE (pulmonary embolism)   . Schizophrenia [facility](HCC)     Patient Active Problem List   Diagnosis Date Noted  . Schizoaffective disorder, bipolar type (HCC) 01/19/2019  . Alcohol abuse   . Auditory hallucinations   . Alcohol abuse with alcohol-induced mood disorder (HCC) 09/08/2016  . Homelessness 09/03/2016  . Person feigning illness 09/03/2016  . 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 Cannabis use disorder, mild, abuse 08/29/2016  . Pulmonary emboli (HCC) 07/13/2016  . Adjustment disorder with emotional disturbance 03/12/2014  . 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 Acute left flank pain 01/02/2014  . Pulmonary embolism, bilateral (HCC) 01/02/2014    History reviewed. No pertinent surgical history.      Home Medications    Prior to Admission medications   Medication Sig Start Date End Date Taking? Authorizing Provider  risperiDONE (RISPERDAL) 1 MG tablet Take 1 tablet (1 mg total) by mouth daily. Take 1 mg every morning Patient taking differently: Take 1 mg by mouth daily.  05/14/19   Aviva KluverMurray, Alyssa B, PA-C  risperiDONE  (RISPERDAL) 3 MG tablet Take 1 tablet (3 mg total) by mouth at bedtime. 05/14/19   Elisha PonderMurray, Alyssa B, PA-C    Family History Family History  Problem Relation Age of Onset  . Hypertension Mother     Social History Social History   Tobacco Use  . Smoking status: Current Every Day Smoker    Packs/day: 0.50    Types: Cigarettes  . Smokeless tobacco: Never Used  Substance Use Topics  . Alcohol use: Yes    Comment: Last drink: this evening   . Drug use: Yes    Types: Marijuana    Comment: Last used: 2 months ago      Allergies   Haldol [haloperidol]   Review of Systems Review of Systems  All other systems reviewed and are negative.    Physical Exam Updated Vital Signs BP (!) 144/88   Pulse (!) 108   Temp 98.2 F (36.8 C)   Resp 17   SpO2 99%   Physical Exam Vitals signs and nursing note reviewed.  Constitutional:      General: He is not in acute distress.    Appearance: He is well-developed. He is not ill-appearing.  HENT:     Head: Normocephalic and atraumatic.  Eyes:     Conjunctiva/sclera: Conjunctivae normal.  Neck:     Musculoskeletal: Neck supple.  Cardiovascular:     Rate and Rhythm: Normal rate.  Pulmonary:     Effort: Pulmonary effort is normal. No respiratory distress.  Abdominal:  General: There is no distension.  Musculoskeletal:     Comments: Moves all extremities  Skin:    General: Skin is warm and dry.  Neurological:     Mental Status: He is alert and oriented to person, place, and time.  Psychiatric:        Mood and Affect: Mood normal.        Behavior: Behavior normal.      ED Treatments / Results  Labs (all labs ordered are listed, but only abnormal results are displayed) Labs Reviewed  COMPREHENSIVE METABOLIC PANEL - Abnormal; Notable for the following components:      Result Value   Calcium 8.7 (*)    All other components within normal limits  ACETAMINOPHEN LEVEL - Abnormal; Notable for the following components:    Acetaminophen (Tylenol), Serum <10 (*)    All other components within normal limits  CBC - Abnormal; Notable for the following components:   Hemoglobin 12.9 (*)    All other components within normal limits  ETHANOL  SALICYLATE LEVEL  RAPID URINE DRUG SCREEN, HOSP PERFORMED    EKG None  Radiology No results found.  Procedures Procedures (including critical care time)  Medications Ordered in ED Medications  risperiDONE (RISPERDAL) tablet 3 mg (has no administration in time range)  risperiDONE (RISPERDAL) tablet 1 mg (has no administration in time range)     Initial Impression / Assessment and Plan / ED Course  I have reviewed the triage vital signs and the nursing notes.  Pertinent labs & imaging results that were available during my care of the patient were reviewed by me and considered in my medical decision making (see chart for details).        Patient with auditory hallucinations.  TTS consult pending.  Medically cleared.  Labs reassuring.  Final Clinical Impressions(s) / ED Diagnoses   Final diagnoses:  Hallucinations    ED Discharge Orders    None       Montine Circle, PA-C 08/07/19 2342    Molpus, Jenny Reichmann, MD 08/08/19 0005

## 2019-08-08 LAB — RAPID URINE DRUG SCREEN, HOSP PERFORMED
Amphetamines: NOT DETECTED
Barbiturates: NOT DETECTED
Benzodiazepines: NOT DETECTED
Cocaine: NOT DETECTED
Opiates: NOT DETECTED
Tetrahydrocannabinol: NOT DETECTED

## 2019-08-08 NOTE — BH Assessment (Signed)
Clinician contacted pt's nurse, Ginger Organ, who stated pt continues to sleep and is unable to be awoken to participate in [facility] Assessment.

## 2019-08-08 NOTE — ED Notes (Signed)
Pt DCd off unit to home per provider. Pt alert, calm, cooperative, no s/s of distress. DC information reviewed and given to pt with acknowledged information  Pt ambulatory off the unit escorted by [facility] .  Pt transported by bus per pt.

## 2019-08-08 NOTE — BH Assessment (Signed)
Tele Assessment Note   Patient Name: Cole Barrett MRN: [id number] Referring Physician: Dahlia ClientBrowning [facility] Location of Provider: Behavioral Health TTS Department  Cole HawkingChristopher Adam Barrett is an 33 y.o. male who [facility] stating that he was hearing voices telling him that they were going to kill him.  Patient states that he has not been taking his medications as prescribed and states that he came to the ED in order to get his nighttime medication.  Patient was given Zyprexa and he slept all night.  Patient states that he is feeling much better now and he states that the voices are quieter.  Patient denies SI/HI and current SA use.  Patient states that he does not feel like he needs to be in the hospital and is requesting to be discharged home and states that he has a safe place to stay.  [facility] and this is a common pattern of behavior for him.    Patient presents as pleasant, alert and cooperative.  His thoughts are organized and his memory is intact.  Patient is moderately anxious.  He does not appear to be responding to internal stimuli.  His judgment, insight and impulse control are characteristically impaired because of his schizophrenia diagnosis.  Diagnosis: F20.9  Past Medical History:  Past Medical History:  Diagnosis Date  . Homelessness   . Hypercholesterolemia   . Mental disorder   . Paranoid behavior (HCC)   . PE (pulmonary embolism)   . Schizophrenia (HCC)     History reviewed. No pertinent surgical history.  Family History:  Family History  Problem Relation Age of Onset  . Hypertension Mother     Social History:  reports that he has been smoking cigarettes. He has been smoking about 0.50 packs per day. He has never used smokeless tobacco. He reports current alcohol use. He reports current drug use. Drug: Marijuana.  Additional Social History:  Alcohol / Drug Use Pain Medications: see MAR Prescriptions: see  MAR Over the Counter: see MAR History of alcohol / drug use?: No history of alcohol / drug abuse Longest period of sobriety (when/how long): Patient has a hx of marijuana use, but denies any current drug use Substance #1 Name of Substance 1: marijuana 1 - Age of First Use: UTA 1 - Amount (size/oz): UTA 1 - Frequency: UTA 1 - Duration: UTA 1 - Last Use / Amount: UTA  CIWA: CIWA-Ar BP: 108/66 Pulse Rate: 85 COWS:    Allergies:  Allergies  Allergen Reactions  . Haldol [Haloperidol] Shortness Of Breath    Home Medications: (Not in a hospital admission)   OB/GYN Status:  No LMP for male patient.  General Assessment Data Assessment unable to be completed: Yes Reason for not completing assessment: Pt continues to sleep and is unable to be awoken [facility] Assessment: In system Is this a Tele or Face-to-Face Assessment?: Tele Assessment Is this an Initial Assessment or a Re-assessment for this encounter?: Initial Assessment Patient Accompanied by:: N/A Language Other than English: No Living Arrangements: Other (Comment)(states that he has his own lace to live) What gender do you identify as?: Male Marital status: Single Living Arrangements: Alone Can pt return to current living arrangement?: Yes Admission Status: Voluntary Is patient capable of signing voluntary admission?: Yes Referral Source: Self/Family/Friend Insurance type: Not on file     Crisis Care Plan Living Arrangements: Alone Legal Guardian: Other:(self) Name of Psychiatrist: (  Cole Barrett) Name of Therapist: None  Education Status Is patient currently in school?: No Is the patient employed, unemployed or receiving disability?: Unemployed  Risk to self with the past 6 months Suicidal Ideation: No Has patient been a risk to self within the past 6 months prior to admission? : No Suicidal Intent: No Has patient had any suicidal intent within the past 6  months prior to admission? : No Is patient at risk for suicide?: No Suicidal Plan?: No Has patient had any suicidal plan within the past 6 months prior to admission? : No Access to Means: No What has been your use of drugs/alcohol within the last 12 months?: none reported Previous Attempts/Gestures: No How many times?: 0 Other Self Harm Risks: none Triggers for Past Attempts: None known Intentional Self Injurious Behavior: None Family Suicide History: No Recent stressful life event(s): Other (Comment)(hallucinations) Persecutory voices/beliefs?: No Depression: No Substance abuse history and/or treatment for substance abuse?: No Suicide prevention information given to non-admitted patients: Not applicable  Risk to Others within the past 6 months Homicidal Ideation: No Does patient have any lifetime risk of violence toward others beyond the six months prior to admission? : No Thoughts of Harm to Others: No-Not Currently Present/Within Last 6 Months Current Homicidal Intent: No-Not Currently/Within Last 6 Months Current Homicidal Plan: No-Not Currently/Within Last 6 Months Access to Homicidal Means: No Identified Victim: none History of harm to others?: No Assessment of Violence: None Noted Violent Behavior Description: none Does patient have access to weapons?: No Criminal Charges Pending?: No Describe Pending Criminal Charges: no Does patient have a court date: No Is patient on probation?: No  Psychosis Hallucinations: Auditory Delusions: None noted  Mental Status Report Appearance/Hygiene: Disheveled Eye Contact: Good Motor Activity: Freedom of movement Speech: Logical/coherent Level of Consciousness: Alert Mood: Anxious Affect: Anxious Anxiety Level: Moderate Thought Processes: Coherent, Relevant Judgement: Partial Orientation: Person, Place, Time, Situation Obsessive Compulsive Thoughts/Behaviors: None  Cognitive Functioning Concentration: Normal Memory:  Recent Intact, Remote Intact Is patient IDD: No  ADLScreening [facility] Assessment Services) Patient's cognitive ability adequate to safely complete daily activities?: Yes Patient able to express need for assistance with ADLs?: Yes Independently performs ADLs?: Yes (appropriate for developmental age)  Prior Inpatient Therapy Prior Inpatient Therapy: Yes Prior Therapy Dates: 2019/2018 Prior Therapy Facilty/Provider(s): [facility] Reason for Treatment: auditory hallucinations  Prior Outpatient Therapy Prior Outpatient Therapy: Yes Prior Therapy Dates: active Prior Therapy Facilty/Provider(s): Cole Barrett Reason for Treatment: medication management Does patient have an ACCT team?: No Does patient have Intensive In-House Services?  : No Does patient have Cole Barrett services? : Yes Does patient have P4CC services?: No  ADL Screening (condition at time of admission) Patient's cognitive ability adequate to safely complete daily activities?: Yes Is the patient deaf or have difficulty hearing?: No Does the patient have difficulty seeing, even when wearing glasses/contacts?: No Does the patient have difficulty concentrating, remembering, or making decisions?: No Patient able to express need for assistance with ADLs?: Yes Does the patient have difficulty dressing or bathing?: No Independently performs ADLs?: Yes (appropriate for developmental age) Does the patient have difficulty walking or climbing stairs?: No Weakness of Legs: None Weakness of Arms/Hands: None  Home Assistive Devices/Equipment Home Assistive Devices/Equipment: None  Therapy Consults (therapy consults require a physician order) PT Evaluation Needed: No OT Evalulation Needed: No SLP Evaluation Needed: No Abuse/Neglect Assessment (Assessment to be complete while patient is alone) Abuse/Neglect Assessment Can Be Completed: Yes Physical Abuse: Denies Verbal Abuse: Denies Sexual Abuse: Denies Self-Neglect: Denies Values /  Beliefs Cultural Requests During Hospitalization: None Spiritual Requests During Hospitalization: None Consults Spiritual Care Consult Needed: No Social Work Consult Needed: No Merchant navy officer (For Healthcare) Does Patient Have a Medical Advance Directive?: No Would patient like information on creating a medical advance directive?: No - Patient declined Nutrition Screen- [facility] Has the patient recently lost weight without trying?: No Has the patient been eating poorly because of a decreased appetite?: No Malnutrition Screening Tool Score: 0        Disposition: Per Elta Guadeloupe, NP, patient can be discharged from the ED to follow-up with [facility] Disposition Initial Assessment Completed for this Encounter: Yes Patient referred to: [name])  This service was provided via telemedicine using a 2-way, interactive audio and Immunologist.  Names of all persons participating in this telemedicine service and their role in this encounter. Name: Cole Barrett Role: patient  Name: Dannielle Huh Natika Geyer Role: TTS  Name:  Role:   Name:  Role:     Daphene Calamity 08/08/2019 9:11 AM

## 2019-08-08 NOTE — BH Assessment (Signed)
[facility] Assessment Progress Note  Per Jinny Blossom, PMHNP , this pt does not require psychiatric hospitalization at this time.  Pt is to be [facility] from [facility] with recommendation to continue treatment with [facility].  This has been included in pt's discharge instructions.  Pt's nurse, Eustaquio Maize, has been notified.  Jalene Mullet, [place] Triage Specialist [phone]

## 2019-08-08 NOTE — BH Assessment (Signed)
Clinician contacted pt's nurse, Ginger Organ, to complete [facility] Assessment but pt was sound asleep and was unable to participate in the assessment at this time.

## 2019-08-08 NOTE — BHH Suicide Risk Assessment (Cosign Needed)
Suicide Risk Assessment  Discharge Assessment   [facility] Discharge Suicide Risk Assessment   Principal Problem: <principal problem not specified> Discharge Diagnoses: Active Problems:   * No active hospital problems. *   Total Time spent with patient: 20 minutes  Musculoskeletal: Strength & Muscle Tone: within normal limits Gait & Station: normal Patient leans: N/A  Psychiatric Specialty Exam:   Blood pressure 108/66, pulse 85, temperature 97.7 F (36.5 C), temperature source Oral, resp. rate 18, SpO2 100 %.There is no height or weight on file to calculate BMI.  General Appearance: Casual  Eye Contact::  Good  Speech:  Clear and Coherent and Normal Rate409  Volume:  Normal  Mood:  Euthymic  Affect:  Congruent  Thought Process:  Coherent, Goal Directed and Descriptions of Associations: Intact  Orientation:  Full (Time, Place, and Person)  Thought Content:  Logical  Suicidal Thoughts:  No  Homicidal Thoughts:  No  Memory:  Immediate;   Good Recent;   Good Remote;   Fair  Judgement:  Fair  Insight:  Fair  Psychomotor Activity:  Normal  Concentration:  Good  Recall:  Good  Fund of Knowledge:Fair  Language: Good  Akathisia:  Negative  Handed:  Right  AIMS (if indicated):     Assets:  Agricultural consultant Housing Physical Health Social Support  Sleep:     Cognition: WNL  ADL's:  Intact   Mental Status Per Nursing Assessment::   On Admission:   Pt is well known to this hospital system. He has a diagnosis of Schizophrenia. He sometimes does not have or forgets to take his Risperdal, which is prescribed for the voices he hears. He will come to the emergency room requesting his medications. Once he spends the night and receives his medication, he is stable and ready to be discharge. He will be referred back to his outpatient provider to resume his medications. Pt is stable and ready for discharge.   Demographic Factors:  Male and Low  socioeconomic status  Loss Factors: NA  Historical Factors: Family history of mental illness or substance abuse  Risk Reduction Factors:   Sense of responsibility to family  Continued Clinical Symptoms:  Schizophrenia:   Paranoid or undifferentiated type  Cognitive Features That Contribute To Risk:  Closed-mindedness    Suicide Risk:  Minimal: No identifiable suicidal ideation.  Patients presenting with no risk factors but with morbid ruminations; may be classified as minimal risk based on the severity of the depressive symptoms   Plan Of Care/Follow-up recommendations:  Activity:  as tolerated Diet:  heart healthy  Ethelene Hal, NP 08/08/2019, 9:14 AM

## 2019-08-08 NOTE — Discharge Instructions (Signed)
For your mental health needs, you are advised to continue treatment with [facility]: ° °     [facility] °     [address] °     [place], [place] [address] °     [phone] °     Crisis number: [phone] °

## 2019-08-18 ENCOUNTER — Emergency Department (HOSPITAL_COMMUNITY)
Admission: EM | Admit: 2019-08-18 | Discharge: 2019-08-18 | Disposition: A | Discharge status: Home / Self Care | Payer: Self-pay | Attending: Emergency Medicine | Admitting: Emergency Medicine

## 2019-08-18 ENCOUNTER — Encounter (HOSPITAL_COMMUNITY): Payer: Self-pay | Admitting: Emergency Medicine

## 2019-08-18 ENCOUNTER — Other Ambulatory Visit: Payer: Self-pay

## 2019-08-18 DIAGNOSIS — F209 Schizophrenia, unspecified: Secondary | ICD-10-CM | POA: Insufficient documentation

## 2019-08-18 DIAGNOSIS — R440 Auditory hallucinations: Secondary | ICD-10-CM | POA: Insufficient documentation

## 2019-08-18 DIAGNOSIS — Z86711 Personal history of pulmonary embolism: Secondary | ICD-10-CM | POA: Insufficient documentation

## 2019-08-18 DIAGNOSIS — F17210 Nicotine dependence, cigarettes, uncomplicated: Secondary | ICD-10-CM | POA: Insufficient documentation

## 2019-08-18 DIAGNOSIS — Z79899 Other long term (current) drug therapy: Secondary | ICD-10-CM | POA: Insufficient documentation

## 2019-08-18 DIAGNOSIS — R443 Hallucinations, unspecified: Secondary | ICD-10-CM

## 2019-08-18 DIAGNOSIS — Z590 Homelessness: Secondary | ICD-10-CM | POA: Insufficient documentation

## 2019-08-18 LAB — CBC WITH DIFFERENTIAL/PLATELET
Abs Immature Granulocytes: 0.05 10*3/uL (ref 0.00–0.07)
Basophils Absolute: 0 10*3/uL (ref 0.0–0.1)
Basophils Relative: 1 %
Eosinophils Absolute: 0.2 10*3/uL (ref 0.0–0.5)
Eosinophils Relative: 5 %
HCT: 44.3 % (ref 39.0–52.0)
Hemoglobin: 14.5 g/dL (ref 13.0–17.0)
Immature Granulocytes: 1 %
Lymphocytes Relative: 35 %
Lymphs Abs: 1.6 10*3/uL (ref 0.7–4.0)
MCH: 28.9 pg (ref 26.0–34.0)
MCHC: 32.7 g/dL (ref 30.0–36.0)
MCV: 88.4 fL (ref 80.0–100.0)
Monocytes Absolute: 0.5 10*3/uL (ref 0.1–1.0)
Monocytes Relative: 11 %
Neutro Abs: 2.1 10*3/uL (ref 1.7–7.7)
Neutrophils Relative %: 47 %
Platelets: 294 10*3/uL (ref 150–400)
RBC: 5.01 MIL/uL (ref 4.22–5.81)
RDW: 12.9 % (ref 11.5–15.5)
WBC: 4.4 10*3/uL (ref 4.0–10.5)
nRBC: 0 % (ref 0.0–0.2)

## 2019-08-18 LAB — COMPREHENSIVE METABOLIC PANEL
ALT: 54 U/L — ABNORMAL HIGH (ref 0–44)
AST: 33 U/L (ref 15–41)
Albumin: 4.2 g/dL (ref 3.5–5.0)
Alkaline Phosphatase: 51 U/L (ref 38–126)
Anion gap: 10 (ref 5–15)
BUN: 12 mg/dL (ref 6–20)
CO2: 25 mmol/L (ref 22–32)
Calcium: 9.1 mg/dL (ref 8.9–10.3)
Chloride: 104 mmol/L (ref 98–111)
Creatinine, Ser: 1.14 mg/dL (ref 0.61–1.24)
GFR calc Af Amer: >60 mL/min (ref >60)
GFR calc non Af Amer: >60 mL/min (ref >60)
Glucose, Bld: 105 mg/dL — ABNORMAL HIGH (ref 70–99)
Potassium: 3.9 mmol/L (ref 3.5–5.1)
Sodium: 139 mmol/L (ref 135–145)
Total Bilirubin: 0.2 mg/dL — ABNORMAL LOW (ref 0.3–1.2)
Total Protein: >12 g/dL — ABNORMAL HIGH (ref 6.5–8.1)

## 2019-08-18 LAB — RAPID URINE DRUG SCREEN, HOSP PERFORMED
Amphetamines: NOT DETECTED
Barbiturates: NOT DETECTED
Benzodiazepines: NOT DETECTED
Cocaine: NOT DETECTED
Opiates: NOT DETECTED
Tetrahydrocannabinol: NOT DETECTED

## 2019-08-18 LAB — ETHANOL: Alcohol, Ethyl (B): <10 mg/dL (ref <10)

## 2019-08-18 MED ORDER — RISPERIDONE 1 MG PO TABS
1.0000 mg | ORAL_TABLET | Freq: Once | ORAL | Status: AC
  Administered 2019-08-18: 18:00:00 1 mg via ORAL
  Filled 2019-08-18: qty 1

## 2019-08-18 MED ORDER — RISPERIDONE 1 MG PO TABS: 1.0000 mg | ORAL_TABLET | Freq: Every day | ORAL | 0 refills | Status: DC

## 2019-08-18 NOTE — Discharge Instructions (Addendum)
Continue taking your medications as prescribed.  Follow-up at [facility] as recommended by our behavioral health team.  Return to the emergency department if any concerning signs or symptoms develop.

## 2019-08-18 NOTE — ED Provider Notes (Signed)
[name] Provider Note   CSN: [id number] Arrival date & time: 08/18/19  [id number]     History   Chief Complaint No chief complaint on file.   HPI Jacquelin HawkingChristopher Adam Ney is a 33 y.o. male with history of homelessness, HLD, schizophrenia, alcohol abuse presents complaining of acutely worsening auditory hallucinations.  He is a longstanding history of similar complaints and has been seen multiple times in the ED for the same.  He reports that the voices acutely worsened today and are telling him to kill himself.  He denies visual hallucinations or suicidal ideations or homicidal ideations.  He denies any medical complaints.  He reports that he has been trying to be compliant with his Risperdal but has not been able to take his evening dose all the time.  Per chart review takes 1 mg Risperdal in the mornings and 3 mg in the evenings.  States he last had his Risperdal last night.  Denies alcohol or drug use.  Tells me that [facility] and "they told me not to come back for a while ".  States "I was hoping you could help me with the hallucinations ".    The history is provided by the patient.    Past Medical History:  Diagnosis Date  . Homelessness   . Hypercholesterolemia   . Mental disorder   . Paranoid behavior (HCC)   . PE (pulmonary embolism)   . Schizophrenia [facility](HCC)     Patient Active Problem List   Diagnosis Date Noted  . Schizoaffective disorder, bipolar type (HCC) 01/19/2019  . Alcohol abuse   . Auditory hallucinations   . Alcohol abuse with alcohol-induced mood disorder (HCC) 09/08/2016  . Homelessness 09/03/2016  . Person feigning illness 09/03/2016  . 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 Cannabis use disorder, mild, abuse 08/29/2016  . Pulmonary emboli (HCC) 07/13/2016  . Adjustment disorder with emotional disturbance 03/12/2014  . 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  Acute left flank pain 01/02/2014  . Pulmonary embolism, bilateral (HCC) 01/02/2014    History reviewed. No pertinent surgical history.      Home Medications    Prior to Admission medications   Medication Sig Start Date End Date Taking? Authorizing Provider  risperiDONE (RISPERDAL) 1 MG tablet Take 1 tablet (1 mg total) by mouth daily. Take 1 mg every morning Patient taking differently: Take 1 mg by mouth daily.  05/14/19   Aviva KluverMurray, Alyssa B, PA-C  risperiDONE (RISPERDAL) 3 MG tablet Take 1 tablet (3 mg total) by mouth at bedtime. 05/14/19   Elisha PonderMurray, Alyssa B, PA-C    Family History Family History  Problem Relation Age of Onset  . Hypertension Mother     Social History Social History   Tobacco Use  . Smoking status: Current Every Day Smoker    Packs/day: 0.50    Types: Cigarettes  . Smokeless tobacco: Never Used  Substance Use Topics  . Alcohol use: Yes    Comment: BAC not available  . Drug use: Yes    Types: Marijuana    Comment: Last used: 2 months ago UDS NA     Allergies   Haldol [haloperidol]   Review of Systems Review of Systems  Constitutional: Negative for chills and fever.  Respiratory: Negative for shortness of breath.   Cardiovascular: Negative for chest pain.  Gastrointestinal: Negative for abdominal pain, nausea and vomiting.  Psychiatric/Behavioral: Positive for hallucinations. Negative for suicidal ideas.  All other systems reviewed and are negative.    Physical Exam Updated Vital Signs BP (!) 147/85 (BP Location: Right Arm)   Pulse (!) 102   Temp 98.5 F (36.9 C) (Oral)   Resp 16   SpO2 98%   Physical Exam Vitals signs and nursing note reviewed.  Constitutional:      General: He is not in acute distress.    Appearance: He is well-developed.     Comments: Resting comfortably in chair, clothing dirty, appearance unkempt  HENT:     Head: Normocephalic and atraumatic.  Eyes:     General:        Right eye: No discharge.        Left  eye: No discharge.     Conjunctiva/sclera: Conjunctivae normal.  Neck:     Vascular: No JVD.     Trachea: No tracheal deviation.  Cardiovascular:     Rate and Rhythm: Normal rate and regular rhythm.  Pulmonary:     Effort: Pulmonary effort is normal.     Breath sounds: Normal breath sounds.  Abdominal:     General: Bowel sounds are normal. There is no distension.     Palpations: Abdomen is soft.     Tenderness: There is no abdominal tenderness. There is no guarding.  Skin:    General: Skin is warm and dry.     Findings: No erythema.  Neurological:     Mental Status: He is alert.  Psychiatric:        Attention and Perception: He perceives auditory hallucinations.        Mood and Affect: Affect is blunt.        Speech: Speech normal.        Behavior: Behavior is cooperative.        Thought Content: Thought content does not include homicidal or suicidal ideation. Thought content does not include homicidal or suicidal plan.        Judgment: Judgment is inappropriate.     Comments: Does not appear to be responding to internal stimuli at this time.      ED Treatments / Results  Labs (all labs ordered are listed, but only abnormal results are displayed) Labs Reviewed  COMPREHENSIVE METABOLIC PANEL - Abnormal; Notable for the following components:      Result Value   Glucose, Bld 105 (*)    Total Protein >12.0 (*)    ALT 54 (*)    Total Bilirubin 0.2 (*)    All other components within normal limits  CBC WITH DIFFERENTIAL/PLATELET  RAPID URINE DRUG SCREEN, HOSP PERFORMED  ETHANOL    EKG None  Radiology No results found.  Procedures Procedures (including critical care time)  Medications Ordered in ED Medications - No data to display   Initial Impression / Assessment and Plan / ED Course  I have reviewed the triage vital signs and the nursing notes.  Pertinent labs & imaging results that were available during my care of the patient were reviewed by me and  considered in my medical decision making (see chart for details).        Patient presenting for evaluation of auditory hallucinations.  He has frequent evaluations in the emergency department for similar presentation.  He is afebrile, initially mildly tachycardic on triage assessment but was not tachycardic on my assessment and is resting comfortably in the ED in no apparent distress.  He is nontoxic in appearance.  Physical examination and screening labs are reassuring.  He is medically  cleared for TTS evaluation at this time.  TTS recommends outpatient follow-up with [facility] and consider use of [facility] ACT team.  Patient does not appear to be an imminent threat to himself or others at this time.  Final Clinical Impressions(s) / ED Diagnoses   Final diagnoses:  Hallucinations    ED Discharge Orders    None       Renita Papa, PA-C 08/18/19 1627    Maudie Flakes, MD 08/23/19 1538

## 2019-08-18 NOTE — BH Assessment (Signed)
Tele Assessment Note   Patient Name: Cole Barrett MRN: [id number] Referring Physician: Ardine EngM Fawze, PA-C Location of Patient: [facility] Location of Provider: Behavioral Health TTS Department  Cole HawkingChristopher Adam Narula is a 33 y.o. male who presented to [facility] on voluntary basis with complaint of worsening auditory hallucination.  Today is Pt's 15th presentation to the ED in 2020 with similar complaint.  [place] lives [place] in [place], and he is followed by [facility].  Pt stated that he has not been to [facility] in a while.  ''I'm trying to get in.''  Pt stated also that he is employed.  Pt reported that today, his auditory hallucinations have worsened.  Unlike previous presentations, auditory hallucinations do not feature command to self-harm or harm others.  ''I took my medication yesterday and was fine.  I didn't take it today.''  Pt denies suicidal ideation, homicidal ideation, visual hallucination, self-injurious behavior, and current substance use (UDS and BAC were not available at time of assessment).  Pt stated that he would [facility] to ''get the voices under control.''    Pt stated that he is not sure when his next appointment at [facility].  During assessment, Pt presented as alert and oriented.  He had good eye contact and was cooperative.  Pt was dressed in street clothes, and he appeared appropriately groomed.  Pt's mood was pleasant.  Affect was mood-congruent.  Pt's speech was normal in rate, rhythm, and volume.  Thought processes were within normal range, and thought content was logical and goal-oriented.  There was no evidence of delusion.  Memory and concentration were fair.  Insight was poor.  Judgment and impulse control were fair.  Consulted with Ander Slade. Starkes, NP, who determined that Pt does not meet inpatient criteria.  It is recommend Pt follow-up with [facility] and also that he consider [facility].  Diagnosis: F20.9 Schizophrenia  Past Medical History:  Past Medical  History:  Diagnosis Date  . Homelessness   . Hypercholesterolemia   . Mental disorder   . Paranoid behavior (HCC)   . PE (pulmonary embolism)   . Schizophrenia (HCC)     History reviewed. No pertinent surgical history.  Family History:  Family History  Problem Relation Age of Onset  . Hypertension Mother     Social History:  reports that he has been smoking cigarettes. He has been smoking about 0.50 packs per day. He has never used smokeless tobacco. He reports current alcohol use. He reports current drug use. Drug: Marijuana.  Additional Social History:     CIWA: CIWA-Ar BP: (!) 147/85 Pulse Rate: (!) 102 COWS:    Allergies:  Allergies  Allergen Reactions  . Haldol [Haloperidol] Shortness Of Breath    Home Medications: (Not in a hospital admission)   OB/GYN Status:  No LMP for male patient.  General Assessment Data [facility] Assessment: In system Is this a Tele or Face-to-Face Assessment?: Tele Assessment Is this an Initial Assessment or a Re-assessment for this encounter?: Initial Assessment Patient Accompanied by:: N/A Language Other than English: No Living Arrangements: Other (Comment)(Lives alone) What gender do you identify as?: Male Marital status: Single Living Arrangements: Alone Can pt return to current living arrangement?: Yes Admission Status: Voluntary Is patient capable of signing voluntary admission?: Yes Referral Source: Self/Family/Friend Insurance type: Nopne     Crisis Care Plan Living Arrangements: Alone Name of Psychiatrist: [facility] Name of Therapist: None  Education Status Is patient currently in school?: No Is  the patient employed, unemployed or receiving disability?: Employed  Risk to self with the past 6 months Suicidal Ideation: No Has patient been a risk to self within the past 6 months prior to admission? : No Suicidal Intent: No Has patient had any suicidal intent within the past 6 months prior  to admission? : No Is patient at risk for suicide?: No Suicidal Plan?: No Has patient had any suicidal plan within the past 6 months prior to admission? : No Access to Means: No What has been your use of drugs/alcohol within the last 12 months?: Marijuana Previous Attempts/Gestures: No Intentional Self Injurious Behavior: None Family Suicide History: No Recent stressful life event(s): Other (Comment)(hallucinations) Persecutory voices/beliefs?: No Depression: No Substance abuse history and/or treatment for substance abuse?: No Suicide prevention information given to non-admitted patients: Not applicable  Risk to Others within the past 6 months Homicidal Ideation: No Does patient have any lifetime risk of violence toward others beyond the six months prior to admission? : No Thoughts of Harm to Others: No Current Homicidal Intent: No Current Homicidal Plan: No Access to Homicidal Means: No History of harm to others?: No Assessment of Violence: None Noted Does patient have access to weapons?: No Criminal Charges Pending?: No Does patient have a court date: No  Psychosis Hallucinations: Auditory Delusions: None noted  Mental Status Report Appearance/Hygiene: Unremarkable, Other (Comment)(Street clothes) Eye Contact: Good Motor Activity: Unremarkable, Freedom of movement Speech: Logical/coherent Level of Consciousness: Alert Mood: Pleasant, Euthymic Affect: Appropriate to circumstance Anxiety Level: Minimal Thought Processes: Relevant, Coherent Judgement: Partial Orientation: Person, Place, Situation, Time Obsessive Compulsive Thoughts/Behaviors: None  Cognitive Functioning Concentration: Normal Memory: Remote Intact, Recent Intact Is patient IDD: No Insight: Poor Impulse Control: Fair Appetite: Good Have you had any weight changes? : No Change Sleep: No Change Total Hours of Sleep: 8 Vegetative Symptoms: None  ADLScreening [facility] Assessment Services) Patient's  cognitive ability adequate to safely complete daily activities?: Yes Patient able to express need for assistance with ADLs?: Yes Independently performs ADLs?: Yes (appropriate for developmental age)  Prior Inpatient Therapy Prior Inpatient Therapy: Yes Prior Therapy Dates: 2019/2018 Prior [facility] Facilty[facility]Provider[facility] Reason for Treatment: auditory hallucinations  Prior Outpatient Therapy Prior Outpatient Therapy: Yes Prior Therapy Dates: Ongoing Prior Therapy Facilty/Provider(s): [facility] Reason for Treatment: medication management Does patient have an ACCT team?: No Does patient have Intensive In-House Services?  : No Does patient have [facility] services? : Yes Does patient have P4CC services?: No  ADL Screening (condition at time of admission) Patient's cognitive ability adequate to safely complete daily activities?: Yes Is the patient deaf or have difficulty hearing?: No Does the patient have difficulty seeing, even when wearing glasses/contacts?: No Does the patient have difficulty concentrating, remembering, or making decisions?: No Patient able to express need for assistance with ADLs?: Yes Does the patient have difficulty dressing or bathing?: No Independently performs ADLs?: Yes (appropriate for developmental age) Does the patient have difficulty walking or climbing stairs?: No Weakness of Legs: None Weakness of Arms/Hands: None  Home Assistive Devices/Equipment Home Assistive Devices/Equipment: None  Therapy Consults (therapy consults require a physician order) PT Evaluation Needed: No OT Evalulation Needed: No SLP Evaluation Needed: No Abuse/Neglect Assessment (Assessment to be complete while patient is alone) Physical Abuse: Denies Verbal Abuse: Denies Sexual Abuse: Denies Exploitation of patient/patient's resources: Denies Self-Neglect: Denies Values / Beliefs Cultural Requests During Hospitalization: None Spiritual Requests During Hospitalization:  None Consults Spiritual Care Consult Needed: No Social Work Consult Needed: No Merchant navy officer (For Healthcare)  Does Patient Have a Medical Advance Directive?: No Would patient like information on creating a medical advance directive?: No - Patient declined          Disposition:  Disposition Initial Assessment Completed for this Encounter: Yes Disposition of Patient: Discharge(Per T. Starkes, NP, Pt does not meet inpt criteria)  This service was provided via telemedicine using a 2-way, interactive audio and video technology.  Names of all persons participating in this telemedicine service and their role in this encounter. Name: Jovany Disano Role: Pt             Marlowe Aschoff 08/18/2019 2:59 PM

## 2019-08-18 NOTE — ED Triage Notes (Signed)
Patient states that he has ongoing issues with auditory hallucinations. Seen multiple times for same. Denies suicidal ideation

## 2019-08-18 NOTE — ED Notes (Signed)
Patient denies SI and HI.

## 2019-08-31 ENCOUNTER — Other Ambulatory Visit: Payer: Self-pay

## 2019-08-31 ENCOUNTER — Emergency Department (HOSPITAL_COMMUNITY)
Admission: EM | Admit: 2019-08-31 | Discharge: 2019-08-31 | Disposition: A | Discharge status: Other Acute Inpatient Hospital | Payer: Self-pay | Attending: Emergency Medicine | Admitting: Emergency Medicine

## 2019-08-31 ENCOUNTER — Encounter (HOSPITAL_COMMUNITY): Payer: Self-pay

## 2019-08-31 ENCOUNTER — Observation Stay (HOSPITAL_COMMUNITY)
Admission: AD | Admit: 2019-08-31 | Discharge: 2019-09-01 | Disposition: A | Discharge status: Home / Self Care | Payer: Self-pay | Source: Intra-hospital | Attending: Psychiatry | Admitting: Psychiatry

## 2019-08-31 ENCOUNTER — Encounter (HOSPITAL_COMMUNITY): Payer: Self-pay | Admitting: *Deleted

## 2019-08-31 ENCOUNTER — Other Ambulatory Visit: Payer: Self-pay | Admitting: Adult Health

## 2019-08-31 DIAGNOSIS — F23 Brief psychotic disorder: Secondary | ICD-10-CM

## 2019-08-31 DIAGNOSIS — F250 Schizoaffective disorder, bipolar type: Secondary | ICD-10-CM | POA: Diagnosis present

## 2019-08-31 DIAGNOSIS — R440 Auditory hallucinations: Secondary | ICD-10-CM

## 2019-08-31 DIAGNOSIS — E7800 Pure hypercholesterolemia, unspecified: Secondary | ICD-10-CM | POA: Insufficient documentation

## 2019-08-31 DIAGNOSIS — Z86711 Personal history of pulmonary embolism: Secondary | ICD-10-CM | POA: Insufficient documentation

## 2019-08-31 DIAGNOSIS — F17210 Nicotine dependence, cigarettes, uncomplicated: Secondary | ICD-10-CM | POA: Insufficient documentation

## 2019-08-31 DIAGNOSIS — F209 Schizophrenia, unspecified: Secondary | ICD-10-CM | POA: Insufficient documentation

## 2019-08-31 DIAGNOSIS — Z888 Allergy status to other drugs, medicaments and biological substances status: Secondary | ICD-10-CM | POA: Insufficient documentation

## 2019-08-31 DIAGNOSIS — Z20828 Contact with and (suspected) exposure to other viral communicable diseases: Secondary | ICD-10-CM | POA: Insufficient documentation

## 2019-08-31 DIAGNOSIS — Z79899 Other long term (current) drug therapy: Secondary | ICD-10-CM | POA: Insufficient documentation

## 2019-08-31 LAB — CBC
HCT: 39.8 % (ref 39.0–52.0)
Hemoglobin: 12.6 g/dL — ABNORMAL LOW (ref 13.0–17.0)
MCH: 27.9 pg (ref 26.0–34.0)
MCHC: 31.7 g/dL (ref 30.0–36.0)
MCV: 88.1 fL (ref 80.0–100.0)
Platelets: 274 10*3/uL (ref 150–400)
RBC: 4.52 MIL/uL (ref 4.22–5.81)
RDW: 12.8 % (ref 11.5–15.5)
WBC: 4.8 10*3/uL (ref 4.0–10.5)
nRBC: 0 % (ref 0.0–0.2)

## 2019-08-31 LAB — RAPID URINE DRUG SCREEN, HOSP PERFORMED
Amphetamines: NOT DETECTED
Barbiturates: NOT DETECTED
Benzodiazepines: NOT DETECTED
Cocaine: NOT DETECTED
Opiates: NOT DETECTED
Tetrahydrocannabinol: NOT DETECTED

## 2019-08-31 LAB — COMPREHENSIVE METABOLIC PANEL
ALT: 46 U/L — ABNORMAL HIGH (ref 0–44)
AST: 28 U/L (ref 15–41)
Albumin: 3.9 g/dL (ref 3.5–5.0)
Alkaline Phosphatase: 44 U/L (ref 38–126)
Anion gap: 10 (ref 5–15)
BUN: 9 mg/dL (ref 6–20)
CO2: 22 mmol/L (ref 22–32)
Calcium: 8.5 mg/dL — ABNORMAL LOW (ref 8.9–10.3)
Chloride: 107 mmol/L (ref 98–111)
Creatinine, Ser: 0.87 mg/dL (ref 0.61–1.24)
GFR calc Af Amer: >60 mL/min (ref >60)
GFR calc non Af Amer: >60 mL/min (ref >60)
Glucose, Bld: 130 mg/dL — ABNORMAL HIGH (ref 70–99)
Potassium: 3.2 mmol/L — ABNORMAL LOW (ref 3.5–5.1)
Sodium: 139 mmol/L (ref 135–145)
Total Bilirubin: 0.4 mg/dL (ref 0.3–1.2)
Total Protein: 7.1 g/dL (ref 6.5–8.1)

## 2019-08-31 LAB — SALICYLATE LEVEL: Salicylate Lvl: <7 mg/dL (ref 2.8–30.0)

## 2019-08-31 LAB — ACETAMINOPHEN LEVEL: Acetaminophen (Tylenol), Serum: <10 ug/mL — ABNORMAL LOW (ref 10–30)

## 2019-08-31 LAB — ETHANOL: Alcohol, Ethyl (B): 88 mg/dL — ABNORMAL HIGH (ref <10)

## 2019-08-31 LAB — SARS CORONAVIRUS 2 BY RT PCR (HOSPITAL ORDER, PERFORMED IN ~~LOC~~ HOSPITAL LAB): SARS Coronavirus 2: NEGATIVE

## 2019-08-31 MED ORDER — RISPERIDONE 2 MG PO TABS: 3.0000 mg | ORAL_TABLET | Freq: Every day | ORAL | Status: DC

## 2019-08-31 MED ORDER — AMANTADINE HCL 100 MG PO CAPS
100.0000 mg | ORAL_CAPSULE | Freq: Two times a day (BID) | ORAL | Status: DC
  Administered 2019-08-31: 100 mg via ORAL
  Filled 2019-08-31: qty 1

## 2019-08-31 MED ORDER — ALUM & MAG HYDROXIDE-SIMETH 200-200-20 MG/5ML PO SUSP: 30.0000 mL | ORAL | Status: AC | PRN

## 2019-08-31 MED ORDER — ALUM & MAG HYDROXIDE-SIMETH 200-200-20 MG/5ML PO SUSP: 30.0000 mL | ORAL | Status: DC | PRN

## 2019-08-31 MED ORDER — POTASSIUM CHLORIDE CRYS ER 20 MEQ PO TBCR
40.0000 meq | EXTENDED_RELEASE_TABLET | Freq: Once | ORAL | Status: AC
  Administered 2019-08-31: 40 meq via ORAL
  Filled 2019-08-31: qty 2

## 2019-08-31 MED ORDER — AMANTADINE HCL 100 MG PO CAPS
100.0000 mg | ORAL_CAPSULE | Freq: Two times a day (BID) | ORAL | Status: DC
  Administered 2019-09-01: 100 mg via ORAL
  Filled 2019-08-31 (×6): qty 1

## 2019-08-31 MED ORDER — INFLUENZA VAC SPLIT QUAD 0.5 ML IM SUSY
0.5000 mL | PREFILLED_SYRINGE | INTRAMUSCULAR | Status: DC
  Filled 2019-08-31: qty 0.5

## 2019-08-31 MED ORDER — ACETAMINOPHEN 325 MG PO TABS: 650.0000 mg | ORAL_TABLET | Freq: Four times a day (QID) | ORAL | Status: AC | PRN

## 2019-08-31 MED ORDER — RISPERIDONE 1 MG PO TABS
1.0000 mg | ORAL_TABLET | Freq: Every day | ORAL | Status: DC
  Administered 2019-09-01: 1 mg via ORAL
  Filled 2019-08-31: qty 1

## 2019-08-31 MED ORDER — PNEUMOCOCCAL VAC POLYVALENT 25 MCG/0.5ML IJ INJ: 0.5000 mL | INJECTION | INTRAMUSCULAR | Status: DC

## 2019-08-31 MED ORDER — RISPERIDONE 3 MG PO TABS
3.0000 mg | ORAL_TABLET | Freq: Every day | ORAL | Status: DC
  Administered 2019-08-31: 21:00:00 3 mg via ORAL
  Filled 2019-08-31: qty 1

## 2019-08-31 MED ORDER — RISPERIDONE 1 MG PO TABS
1.0000 mg | ORAL_TABLET | Freq: Every day | ORAL | Status: DC
  Administered 2019-08-31: 13:00:00 1 mg via ORAL
  Filled 2019-08-31: qty 1

## 2019-08-31 MED ORDER — ACETAMINOPHEN 325 MG PO TABS: 650.0000 mg | ORAL_TABLET | Freq: Four times a day (QID) | ORAL | Status: DC | PRN

## 2019-08-31 MED ORDER — MAGNESIUM HYDROXIDE 400 MG/5ML PO SUSP: 30.0000 mL | Freq: Every day | ORAL | Status: AC | PRN

## 2019-08-31 MED ORDER — MAGNESIUM HYDROXIDE 400 MG/5ML PO SUSP: 30.0000 mL | Freq: Every day | ORAL | Status: DC | PRN

## 2019-08-31 MED ORDER — RISPERIDONE 1 MG PO TABS
1.0000 mg | ORAL_TABLET | Freq: Once | ORAL | Status: AC
  Administered 2019-08-31: 10:00:00 1 mg via ORAL
  Filled 2019-08-31: qty 1

## 2019-08-31 NOTE — Progress Notes (Addendum)
Admission DAR Note: Pt ambulatory to Observation Unit with a steady gait under voluntary status. Presents A & O X3, paranoid / suspicious, disheveled with matted hair, layered underwear but cooperative with care and admission process. Denies HI, VH and pain when assessed. Endorsed passive SI and AH "the voices are telling me to kill myself since last night". Pt verbally contracts for safety. Reports etoh and marijuana use "couple times in the week"; last used etoh on 08/30/19 and marijuana "sometimes this week". Skin assessment done and belongings searched per protocol. Multiple corns noted under bilateral feet "that's from me walking". Vitals stable and documented. Unit orientation done, routines discussed with pt and understanding verbalized. Pt's belongings placed in locker #53 (3 pt's belonging bags from [employer]).  Pt encouraged to verbalized concerns. Q 15 minutes safety checks initiated without self harm gestures. Will continue to monitor pt and report changes as it occurs.

## 2019-08-31 NOTE — BH Assessment (Signed)
[facility] Assessment Progress Note    Case was staffed with Akintayo MD who recommended patient be evaluated for medication interventions and observed and monitored. Patient will be seen by psychiatry in the a.m. 

## 2019-08-31 NOTE — ED Triage Notes (Signed)
Pt reports auditory hallucinations. States that the voices are telling him to kill himself, but he doesn't want to and hasn't tried.

## 2019-08-31 NOTE — ED Provider Notes (Signed)
[facility]-EMERGENCY DEPT Provider Note   CSN: [id number] Arrival date & time: 08/31/19  0416     History   Chief Complaint Chief Complaint  Patient presents with  . Hallucinations    Auditory    HPI Cole Barrett is a 33 y.o. male.     The history is provided by the patient and medical records. No language interpreter was used.   Cole Barrett is a 33 y.o. male who presents to the Emergency Department complaining of hallucinations. He presents to the emergency department complaining of auditory hallucinations. He states that he awoke last night and some voices kept telling him they were going to kill him. He is still hearing the voices but they are less intense. He denies any SI, HI. He comes in seeking help for the symptoms. He denies any recent illnesses or stressors. He lives at home alone. He does use tobacco. He denies any alcohol or drug use. He is requesting referral to [facility]. He states that he is on Risperdal and has been compliant with the medications. He takes 1 mg in the morning and 3 mg at night. Symptoms are moderate to severe and waxing and waning. Past Medical History:  Diagnosis Date  . Homelessness   . Hypercholesterolemia   . Mental disorder   . Paranoid behavior (HCC)   . PE (pulmonary embolism)   . Schizophrenia [facility])     Patient Active Problem List   Diagnosis Date Noted  . Schizoaffective disorder, bipolar type (HCC) 01/19/2019  . Alcohol abuse   . Auditory hallucinations   . Alcohol abuse with alcohol-induced mood disorder (HCC) 09/08/2016  . Homelessness 09/03/2016  . Person feigning illness 09/03/2016  . 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 Cannabis use disorder, mild, abuse 08/29/2016  . Pulmonary emboli (HCC) 07/13/2016  . Adjustment disorder with emotional disturbance 03/12/2014  . 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 Acute left flank pain 01/02/2014  . Pulmonary  embolism, bilateral (HCC) 01/02/2014    History reviewed. No pertinent surgical history.      Home Medications    Prior to Admission medications   Medication Sig Start Date End Date Taking? Authorizing Provider  risperiDONE (RISPERDAL) 1 MG tablet Take 1 tablet (1 mg total) by mouth daily. Take 1 mg every morning 08/18/19  Yes Fawze, Mina A, PA-C  risperiDONE (RISPERDAL) 3 MG tablet Take 1 tablet (3 mg total) by mouth at bedtime. 05/14/19  Yes Elisha Ponder, PA-C    Family History Family History  Problem Relation Age of Onset  . Hypertension Mother     Social History Social History   Tobacco Use  . Smoking status: Current Every Day Smoker    Packs/day: 0.50    Types: Cigarettes  . Smokeless tobacco: Never Used  Substance Use Topics  . Alcohol use: Yes    Comment: BAC not available  . Drug use: Yes    Types: Marijuana    Comment: Last used: 2 months ago UDS NA     Allergies   Haldol [haloperidol]   Review of Systems Review of Systems  All other systems reviewed and are negative.    Physical Exam Updated Vital Signs BP 127/82 (BP Location: Left Arm)   Pulse 69   Temp 98.1 F (36.7 C) (Oral)   Resp 16   Ht 5\' 10"  (1.778 m)   Wt 99.8 kg   SpO2 96%   BMI 31.57 kg/m   Physical Exam Vitals  signs and nursing note reviewed.  Constitutional:      Appearance: He is well-developed.  HENT:     Head: Normocephalic and atraumatic.  Cardiovascular:     Rate and Rhythm: Normal rate and regular rhythm.  Pulmonary:     Effort: Pulmonary effort is normal. No respiratory distress.  Musculoskeletal:        General: No tenderness.  Skin:    General: Skin is warm and dry.  Neurological:     Mental Status: He is alert and oriented to person, place, and time.  Psychiatric:     Comments: Flat affect, poor eye contact.  Denies SI, HI.  Normal attention span.        ED Treatments / Results  Labs (all labs ordered are listed, but only abnormal results are  displayed) Labs Reviewed  COMPREHENSIVE METABOLIC PANEL - Abnormal; Notable for the following components:      Result Value   Potassium 3.2 (*)    Glucose, Bld 130 (*)    Calcium 8.5 (*)    ALT 46 (*)    All other components within normal limits  ETHANOL - Abnormal; Notable for the following components:   Alcohol, Ethyl (B) 88 (*)    All other components within normal limits  ACETAMINOPHEN LEVEL - Abnormal; Notable for the following components:   Acetaminophen (Tylenol), Serum <10 (*)    All other components within normal limits  CBC - Abnormal; Notable for the following components:   Hemoglobin 12.6 (*)    All other components within normal limits  SALICYLATE LEVEL  RAPID URINE DRUG SCREEN, HOSP PERFORMED    EKG None  Radiology No results found.  Procedures Procedures (including critical care time)  Medications Ordered in ED Medications - No data to display   Initial Impression / Assessment and Plan / ED Course  I have reviewed the triage vital signs and the nursing notes.  Pertinent labs & imaging results that were available during my care of the patient were reviewed by me and considered in my medical decision making (see chart for details).        Patient here for evaluation of auditory hallucinations telling him to kill himself. He is calm on evaluation in the emergency department. He is been medically cleared for psychiatric evaluation and treatment.  Final Clinical Impressions(s) / ED Diagnoses   Final diagnoses:  Auditory hallucination    ED Discharge Orders    None       Quintella Reichert, MD 08/31/19 1452

## 2019-08-31 NOTE — BH Assessment (Addendum)
Assessment Note  Cole Barrett is an 33 y.o. male that presents this date with increased AH. Patient denies any S/I, H/I or VH. Patient is complaining about ongoing AH stating the "voices are telling him they are going to kill him." Patient has a noted history of schizophrenia and is well known to area providers. Patient was last seen on 08/18/19 when he presented with similar symptoms. Patient states he has ran out of his Risperdal that he is prescribed thorough [facility] OP services one week ago. He has a longstanding history of similar complaints and has been seen multiple times in the ED for the same. He reports that the voices acutely worsened today and are telling him they are going to hurt him. He denies visual hallucinations or suicidal ideations or homicidal ideations. He denies any medical complaints. Patient has a history of Cannabis use although denies urrent use of any substances. Per notes this date on admission patient presents to the Emergency Department complaining of hallucinations. He presents to the emergency department complaining of auditory hallucinations. He states that he awoke last night and some voices kept telling him they were going to kill him. He is still hearing the voices but they are less intense. He denies any SI, HI. He comes in seeking help for the symptoms. He denies any recent illnesses or stressors. He lives at home alone. He does use tobacco. He denies any alcohol or drug use. He states that he is on Risperdal and has been compliant with the medications. He takes 1 mg in the morning and 3 mg at night. Symptoms are moderate to severe and waxing and waning. Case was staffed with Akintayo MD who recommended patient be evaluated for medication interventions and observed and monitored. Patient will be seen by psychiatry in the a.m.   Diagnosis: F29.0 Schizophrenia   Past Medical History:  Past Medical History:  Diagnosis Date  . Homelessness   .  Hypercholesterolemia   . Mental disorder   . Paranoid behavior (HCC)   . PE (pulmonary embolism)   . Schizophrenia (HCC)     History reviewed. No pertinent surgical history.  Family History:  Family History  Problem Relation Age of Onset  . Hypertension Mother     Social History:  reports that he has been smoking cigarettes. He has been smoking about 0.50 packs per day. He has never used smokeless tobacco. He reports current alcohol use. He reports current drug use. Drug: Marijuana.  Additional Social History:  Alcohol / Drug Use Pain Medications: See MAR Prescriptions: See MAR Over the Counter: See MAR History of alcohol / drug use?: Yes Longest period of sobriety (when/how long): UNK Negative Consequences of Use: (Denies) Withdrawal Symptoms: (Denies) Substance #1 Name of Substance 1: Cannabis per hx 1 - Age of First Use: 15 1 - Amount (size/oz): Varies 1 - Frequency: Varies 1 - Duration: Ongoing 1 - Last Use / Amount: UTA UDS pending  CIWA: CIWA-Ar BP: 127/82 Pulse Rate: 69 COWS:    Allergies:  Allergies  Allergen Reactions  . Haldol [Haloperidol] Shortness Of Breath    Home Medications: (Not in a hospital admission)   OB/GYN Status:  No LMP for male patient.  General Assessment Data [facility] of Assessment: [facility] TTS Assessment: In system Is this a Tele or Face-to-Face Assessment?: Face-to-Face Is this an Initial Assessment or a Re-assessment for this encounter?: Initial Assessment Patient Accompanied by:: N/A Language Other than English: No Living Arrangements: Other (Comment) What gender do you  identify as?: Male Marital status: Single Living Arrangements: Alone Can pt return to current living arrangement?: Yes Admission Status: Voluntary Is patient capable of signing voluntary admission?: Yes Referral Source: Self/Family/Friend Insurance type: Self pay  Medical Screening Exam [facility] Walk-in ONLY) Medical Exam completed: Yes  Crisis Care  Plan Living Arrangements: Alone Legal Guardian: (Self) Name of Psychiatrist: [facility] Name of Therapist: None  Education Status Is patient currently in school?: No Is the patient employed, unemployed or receiving disability?: Employed  Risk to self with the past 6 months Suicidal Ideation: No Has patient been a risk to self within the past 6 months prior to admission? : No Suicidal Intent: No Has patient had any suicidal intent within the past 6 months prior to admission? : No Is patient at risk for suicide?: No, but patient needs Medical Clearance Suicidal Plan?: No Has patient had any suicidal plan within the past 6 months prior to admission? : No Access to Means: No What has been your use of drugs/alcohol within the last 12 months?: Current use Previous Attempts/Gestures: No How many times?: 0 Other Self Harm Risks: (NA) Triggers for Past Attempts: (NA) Intentional Self Injurious Behavior: None Family Suicide History: No Recent stressful life event(s): Other (Comment)(Incresed AH) Persecutory voices/beliefs?: No Depression: No Depression Symptoms: (denies) Substance abuse history and/or treatment for substance abuse?: No Suicide prevention information given to non-admitted patients: Not applicable  Risk to Others within the past 6 months Homicidal Ideation: No Does patient have any lifetime risk of violence toward others beyond the six months prior to admission? : No Thoughts of Harm to Others: No Current Homicidal Intent: No Current Homicidal Plan: No Access to Homicidal Means: No Identified Victim: NA History of harm to others?: No Assessment of Violence: None Noted Violent Behavior Description: NA Does patient have access to weapons?: No Criminal Charges Pending?: No Describe Pending Criminal Charges: NA Does patient have a court date: No  Psychosis Hallucinations: Auditory Delusions: None noted  Mental Status Report Appearance/Hygiene: (Street clothes) Eye  Contact: Fair Motor Activity: Freedom of movement Speech: Logical/coherent Level of Consciousness: Quiet/awake Mood: Pleasant Affect: Appropriate to circumstance Anxiety Level: Minimal Thought Processes: Coherent, Relevant Judgement: Partial Orientation: Person, Place, Time Obsessive Compulsive Thoughts/Behaviors: None  Cognitive Functioning Concentration: Normal Memory: Recent Intact, Remote Intact Is patient IDD: No Insight: Fair Impulse Control: Fair Appetite: Good Have you had any weight changes? : No Change Sleep: No Change Total Hours of Sleep: 8 Vegetative Symptoms: None  ADLScreening [facility] Assessment Services) Patient's cognitive ability adequate to safely complete daily activities?: Yes Patient able to express need for assistance with ADLs?: Yes Independently performs ADLs?: Yes (appropriate for developmental age)  Prior Inpatient Therapy Prior Inpatient Therapy: Yes Prior Therapy Dates: 2018, 2019 Prior Therapy Facilty/Provider(s): [facility] Reason for Treatment: AH  Prior Outpatient Therapy Prior Outpatient Therapy: Yes Prior Therapy Dates: Ongoing Prior Therapy Facilty/Provider(s): [facility] Reason for Treatment: Med mang Does patient have an ACCT team?: No Does patient have Intensive In-House Services?  : No Does patient have [facility] services? : Yes Does patient have P4CC services?: No  ADL Screening (condition at time of admission) Patient's cognitive ability adequate to safely complete daily activities?: Yes Is the patient deaf or have difficulty hearing?: No Does the patient have difficulty seeing, even when wearing glasses/contacts?: No Does the patient have difficulty concentrating, remembering, or making decisions?: No Patient able to express need for assistance with ADLs?: Yes Does the patient have difficulty dressing or bathing?: No Independently performs ADLs?: Yes (appropriate  for developmental age) Does the patient have difficulty walking or  climbing stairs?: No Weakness of Legs: None Weakness of Arms/Hands: None  Home Assistive Devices/Equipment Home Assistive Devices/Equipment: None  Therapy Consults (therapy consults require a physician order) PT Evaluation Needed: No OT Evalulation Needed: No SLP Evaluation Needed: No Abuse/Neglect Assessment (Assessment to be complete while patient is alone) Physical Abuse: Denies Verbal Abuse: Denies Sexual Abuse: Denies Exploitation of patient/patient's resources: Denies Self-Neglect: Denies Values / Beliefs Cultural Requests During Hospitalization: None Spiritual Requests During Hospitalization: None Consults Spiritual Care Consult Needed: No Social Work Consult Needed: No Regulatory affairs officer (For Healthcare) Does Patient Have a Medical Advance Directive?: No Would patient like information on creating a medical advance directive?: No - Patient declined          Disposition: Case was staffed with Akintayo MD who recommended patient be evaluated for medication interventions and observed and monitored. Patient will be seen by psychiatry in the a.m. Disposition Initial Assessment Completed for this Encounter: Yes Disposition of Patient: (Observe and monitor)  On Site Evaluation by:   Reviewed with Physician:    Mamie Nick 08/31/2019 11:54 AM

## 2019-08-31 NOTE — Plan of Care (Signed)
[facility] Crisis Plan  Reason for Crisis Plan:  Chronic Mental Illness/Medical Illness, Crisis Stabilization and Medication Management   Plan of Care:  Referral for Inpatient Hospitalization  Family Support:      Current Living Environment: Homeless  Insurance:   Hospital Account    Name Acct ID Class Status Primary Coverage   Cole Barrett, Cole Barrett [id number] [place] [facility]/DD/SAS - [place]        Guarantor Account (for Hospital Account [id number])    Name Relation to Pt Service Area Active? Acct Type   Cole Barrett Self [facility] Yes Behavioral Health   Address Phone       [place], [place] [address] [phone](H)          Coverage Information (for Hospital Account [id number])    F/O Payor/Plan Precert #   [place] [facility]/DD/SAS/[place] [place] #   Cole Barrett, Cole Barrett [id number]   Address Phone   [address] [place], [place] [address] [phone]      Legal Guardian:  Legal Guardian: (self)  Primary Care Provider:  Patient, No Pcp Per  Current Outpatient Providers:  [facility] Observation Unit  Psychiatrist:     Counselor/Therapist:     Compliant with Medications:  Yes  Additional Information:   [place] [employer] 10/3/20207:25 PM

## 2019-08-31 NOTE — Progress Notes (Signed)
Patient admitted to [facility] for 24 hour OBS

## 2019-08-31 NOTE — ED Notes (Signed)
Patient belongings in cabinet behind nurses station.  

## 2019-08-31 NOTE — Progress Notes (Signed)
Patient assigned OBS bed #207. Patient needs covid test before [facility] OBS arrival.  Call report to [phone].

## 2019-08-31 NOTE — ED Notes (Signed)
Patient transported via [employer]. Belongings sent with the patient.

## 2019-08-31 NOTE — BH Assessment (Addendum)
[place] Assessment Progress Note Per [facility] AC patient has been accepted to OBS 207 AC to coordinate time. Patient will be transported once COVID test has resulted.

## 2019-08-31 NOTE — ED Notes (Signed)
3 bags of patient belongings at nurses station

## 2019-08-31 NOTE — ED Notes (Signed)
Pt changing into purple scrubs.  

## 2019-08-31 NOTE — Progress Notes (Signed)
Patient ID: Cole Barrett, male   DOB: May 03, 1986, 33 y.o.   MRN: [id number] Pt A&O x 4, no distress noted, resting at present. Remains SI, monitoring for safety.  Denies HI or AVH.

## 2019-08-31 NOTE — Progress Notes (Signed)
[place] NOVEL CORONAVIRUS (COVID-19) DAILY CHECK-OFF SYMPTOMS - answer yes or no to each - every day NO YES  Have you had a fever in the past 24 hours?  . Fever (Temp > 37.80C / 100F) X   Have you had any of these symptoms in the past 24 hours? . New Cough .  Sore Throat  .  Shortness of Breath .  Difficulty Breathing .  Unexplained Body Aches   X   Have you had any one of these symptoms in the past 24 hours not related to allergies?   . Runny Nose .  Nasal Congestion .  Sneezing   X   If you have had runny nose, nasal congestion, sneezing in the past 24 hours, has it worsened?  X   EXPOSURES - check yes or no X   Have you traveled outside the state in the past 14 days?  X   Have you been in contact with someone with a confirmed diagnosis of COVID-19 or PUI in the past 14 days without wearing appropriate PPE?  X   Have you been living in the same home as a person with confirmed diagnosis of COVID-19 or a PUI (household contact)?    X   Have you been diagnosed with COVID-19?    X              What to do next: Answered NO to all: Answered YES to anything:   Proceed with unit schedule Follow the [facility] Inpatient Flowsheet.   

## 2019-09-01 LAB — TSH: TSH: 1.987 u[IU]/mL (ref 0.350–4.500)

## 2019-09-01 LAB — HEMOGLOBIN A1C
Hgb A1c MFr Bld: 5.2 % (ref 4.8–5.6)
Mean Plasma Glucose: 102.54 mg/dL

## 2019-09-01 LAB — LIPID PANEL
Cholesterol: 243 mg/dL — ABNORMAL HIGH (ref 0–200)
HDL: 39 mg/dL — ABNORMAL LOW (ref >40)
LDL Cholesterol: UNDETERMINED mg/dL (ref 0–99)
Total CHOL/HDL Ratio: 6.2 RATIO
Triglycerides: 411 mg/dL — ABNORMAL HIGH (ref <150)
VLDL: UNDETERMINED mg/dL (ref 0–40)

## 2019-09-01 MED ORDER — AMANTADINE HCL 100 MG PO CAPS: 100.0000 mg | ORAL_CAPSULE | Freq: Two times a day (BID) | ORAL | 0 refills | Status: DC

## 2019-09-01 MED ORDER — RISPERIDONE 1 MG PO TABS: 1.0000 mg | ORAL_TABLET | Freq: Every day | ORAL | 0 refills | Status: DC

## 2019-09-01 NOTE — Discharge Summary (Addendum)
Physician Discharge Summary Note  Patient:  Cole Barrett is an 33 y.o., male MRN:  [id number] DOB:  [birth year] Patient phone:  [phone] (home)  Patient address:   [address] [place] [address],  Total Time spent with patient: 20 minutes  Date of Admission:  08/31/2019 Date of Discharge: 09/01/2019  Reason for Admission:  Auditory Hallucinations  Principal Problem: Schizoaffective disorder, bipolar type [facility]) Discharge Diagnoses: Principal Problem:   Schizoaffective disorder, bipolar type [facility])   Past Psychiatric History:   Past Medical History:  Past Medical History:  Diagnosis Date  . Homelessness   . Hypercholesterolemia   . Mental disorder   . Paranoid behavior (HCC)   . PE (pulmonary embolism)   . Schizophrenia (HCC)    History reviewed. No pertinent surgical history. Family History:  Family History  Problem Relation Age of Onset  . Hypertension Mother    Family Psychiatric  History: unknown Social History:  Social History   Substance and Sexual Activity  Alcohol Use Yes   Comment: BAC not available     Social History   Substance and Sexual Activity  Drug Use Yes  . Types: Marijuana   Comment: Last used: 2 months ago UDS NA    Social History   Socioeconomic History  . Marital status: Single    Spouse name: Not on file  . Number of children: Not on file  . Years of education: Not on file  . Highest education level: Not on file  Occupational History  . 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 Financial resource strain: Not on file  . Food insecurity    Worry: Not on file    Inability: Not on file  . Transportation needs    Medical: Not on file    Non-medical: Not on file  Tobacco Use  . Smoking status: Current Every Day Smoker    Packs/day: 0.50    Types: Cigarettes  . Smokeless tobacco: Never Used  Substance and Sexual Activity  . Alcohol use: Yes    Comment: BAC not available  . Drug use: Yes     Types: Marijuana    Comment: Last used: 2 months ago UDS NA  . Sexual activity: Never  Lifestyle  . Physical activity    Days per week: Not on file    Minutes per session: Not on file  . Stress: Not on file  Relationships  . Social Musician on phone: Not on file    Gets together: Not on file    Attends religious service: Not on file    Active member of club or organization: Not on file    Attends meetings of clubs or organizations: Not on file    Relationship status: Not on file  Other Topics Concern  . Not on file  Social History Narrative   ** Merged History Encounter **    Pt lives alone in [place].  He is employed.  He stated that he is followed by [facility], but that he has not been there in a while.    Hospital Course:  Per EDP  Admission  Note The history is provided by the patient and medical records. No language interpreter was used.   Cole Barrett is a 33 y.o. male who presents to the Emergency Department complaining of hallucinations. He presents to the emergency department complaining of auditory hallucinations. He states that he awoke last night and some voices kept telling  him they were going to kill him. He is still hearing the voices but they are less intense. He denies any SI, HI. He comes in seeking help for the symptoms. He denies any recent illnesses or stressors. He lives at home alone. He does use tobacco. He denies any alcohol or drug use. [facility]. He states that he is on Risperdal and has been compliant with the medications. He takes 1 mg in the morning and 3 mg at night. Symptoms are moderate to severe and waxing and waning.  October 4,2020 Patient records reviewed and seen for face to face visit by mental health team.  On evaluation Cole Hawkinghristopher Adam Creasy reports  "better and ready to go".  He enthusiastically stood up to greet the mental health team. The patient is known to this facility for similar  presentation, last seen 2 weeks prior with similar symptomatology and discharged with recommendations to follow-up with community mental health resources.  He states he did not follow-up with [facility] as previously discussed.  He states prior to admission he was not taking his antipsychotic medications and began having increased audible hallucinations, denies suicidal or homicidal ideations at the time of admission.  States a good night's sleep last night, and restarting risperidal, he feels rested, no longer hears voices.  He will discharge today with a plan to follow-up with [facility].   During evaluation Cole HawkingChristopher Adam Barrett is sitting on the side of the bed. He is alert/oriented x 4; calm/cooperative; and mood congruent with affect.  Patient is speaking in a clear tone at moderate volume, and normal pace; with good eye contact.  His thought process is coherent and relevant; There is no indication that he is currently responding to internal/external stimuli or experiencing delusional thought content.  Patient denies suicidal/self-harm/homicidal ideation, psychosis, and paranoia.  Patient has remained calm throughout assessment and has answered questions appropriately.   Physical Findings: AIMS: Facial and Oral Movements Muscles of Facial Expression: None, normal Lips and Perioral Area: None, normal Jaw: None, normal Tongue: None, normal,Extremity Movements Upper (arms, wrists, hands, fingers): None, normal Lower (legs, knees, ankles, toes): None, normal, Trunk Movements Neck, shoulders, hips: None, normal, Overall Severity Severity of abnormal movements (highest score from questions above): None, normal Incapacitation due to abnormal movements: None, normal Patient's awareness of abnormal movements (rate only patient's report): No Awareness, Dental Status Current problems with teeth and/or dentures?: No Does patient usually wear dentures?: No  CIWA:  CIWA-Ar Total: 0 COWS:  COWS Total Score:  1  Musculoskeletal: Strength & Muscle Tone: within normal limits Gait & Station: normal Patient leans: N/A  Psychiatric Specialty Exam: Physical Exam  Constitutional: He is oriented to person, place, and time. He appears well-developed.  Eyes: Pupils are equal, round, and reactive to light.  Neck: Normal range of motion.  Respiratory: Effort normal.  Musculoskeletal: Normal range of motion.  Neurological: He is alert and oriented to person, place, and time.  Psychiatric: He has a normal mood and affect. His behavior is normal. Judgment and thought content normal.    Review of Systems  Psychiatric/Behavioral: Negative for depression, hallucinations (has schizoaffective disorder but denies AVH today) and suicidal ideas.    Blood pressure (!) 104/93, pulse 79, temperature 97.9 F (36.6 C), resp. rate 16, SpO2 100 %.There is no height or weight on file to calculate BMI.  General Appearance: Casual  Eye Contact:  Good  Speech:  Clear and Coherent  Volume:  Normal  Mood:  Euthymic  and smiling and laughing while talking with MH team  Affect:  Congruent  Thought Process:  Coherent  Orientation:  Full (Time, Place, and Person)  Thought Content:  Logical  Suicidal Thoughts:  No  Homicidal Thoughts:  No  Memory:  Immediate;   Good Recent;   Good Remote;   Good  Judgement:  Intact  Insight:  Good  Psychomotor Activity:  Normal  Concentration:  Concentration: Good and Attention Span: Fair  Recall:  [place] of Knowledge:  Good  Language:  Fair  Akathisia:  Negative  Handed:  Right  AIMS (if indicated):     Assets:  Desire for Improvement Resilience  ADL's:  Intact  Cognition:  WNL  Sleep:   >6 hours        Has this patient used any form of tobacco in the last 30 days? (Cigarettes, Smokeless Tobacco, Cigars, and/or Pipes) Yes, Yes, Prescription not provided because: the patient declined  Blood Alcohol level:  Lab Results  Component Value Date   ETH 88 (H) 08/31/2019    ETH <10 48/54/6270    Metabolic Disorder Labs:  Lab Results  Component Value Date   HGBA1C 5.2 09/01/2019   MPG 102.54 09/01/2019   MPG 105.41 11/26/2017   Lab Results  Component Value Date   PROLACTIN 63.0 (H) 11/26/2017   PROLACTIN 34.1 (H) 08/27/2016   Lab Results  Component Value Date   CHOL 243 (H) 09/01/2019   TRIG 411 (H) 09/01/2019   HDL 39 (L) 09/01/2019   CHOLHDL 6.2 09/01/2019   VLDL UNABLE TO CALCULATE IF TRIGLYCERIDE OVER 400 mg/dL 09/01/2019   LDLCALC UNABLE TO CALCULATE IF TRIGLYCERIDE OVER 400 mg/dL 09/01/2019   LDLCALC 166 (H) 11/26/2017    See Psychiatric Specialty Exam and Suicide Risk Assessment completed by Attending Physician prior to discharge.  Discharge destination:  Home  Is patient on multiple antipsychotic therapies at discharge:  No   Has Patient had three or more failed trials of antipsychotic monotherapy by history:  No  Recommended Plan for Multiple Antipsychotic Therapies: NA  Discharge Instructions    Diet - low sodium heart healthy   Complete by: As directed    Increase activity slowly   Complete by: As directed      Allergies as of 09/01/2019      Reactions   Haldol [haloperidol] Shortness Of Breath      Medication List    TAKE these medications     Indication  amantadine 100 MG capsule Commonly known as: SYMMETREL Take 1 capsule (100 mg total) by mouth 2 (two) times daily.  Indication: EPS prevention   risperiDONE 3 MG tablet Commonly known as: RisperDAL Take 1 tablet (3 mg total) by mouth at bedtime. What changed: Another medication with the same name was changed. Make sure you understand how and when to take each.  Indication: Schizophrenia   risperiDONE 1 MG tablet Commonly known as: RisperDAL Take 1 tablet (1 mg total) by mouth daily. Start taking on: September 02, 2019 What changed: additional instructions  Indication: Psychosis        Follow-up recommendations:  Activity:  as tolerated Diet:  as  tolerated Other:  follow-up with [facility] as discussed  Comments:  Discharge home.  The patient appears reasonably screened and/or stabilized for discharge and does not appear to have emergency medical/psychiatric concerns/conditions requiring further screening, evaluation, or treatment at this time prior to discharge.    Take all of you medications as prescribed by your mental healthcare  provider.  Report any adverse effects and reactions from your medications to your outpatient provider promptly.  Do not engage in alcohol and or illegal drug use while on prescription medicines. Keep all scheduled appointments. This is to ensure that you are getting refills on time and to avoid any interruption in your medication.  If you are unable to keep an appointment call to reschedule.  Be sure to follow up with resources and follow ups given. In the event of worsening symptoms call the crisis hotline, 911, and or go to the nearest emergency department for appropriate evaluation and treatment of symptoms. Follow-up with your primary care provider for your medical issues, concerns and or health care needs.     Signed: [name], NP 09/01/2019, 1:37 PM  Patient seen face-to-face for psychiatric evaluation, chart reviewed and case discussed with the physician extender and developed treatment plan. Reviewed the information documented and agree with the treatment plan. Thedore Mins, MD

## 2019-09-01 NOTE — Progress Notes (Signed)
Patient ID: Cole Barrett, male   DOB: Feb 05, 1986, 33 y.o.   MRN: [id number]  Patient sleeping soundly. Arousable. Unwilling to participate in assessment at this time.  Patient will need to be assessed in the morning and H&P completed at that time.  Resumed home medications:  Risperdal 1 mg daily and 3 mg QHS for schizoaffective disorder Symmetrel 100 mg BID for EPS

## 2019-09-01 NOTE — Progress Notes (Signed)
Nursing discharge note: Patient discharged home per MD order.  Patient received all personal belongings from unit and locker.  Reviewed AVS/transition record with patient and he/she indicated understanding.  Patient will follow up with [facility].  Patient denies any thoughts of self harm.  He left ambulatory with his belongings.

## 2019-09-02 LAB — LDL CHOLESTEROL, DIRECT: Direct LDL: 151.5 mg/dL — ABNORMAL HIGH (ref 0–99)

## 2019-09-14 ENCOUNTER — Encounter (HOSPITAL_COMMUNITY): Payer: Self-pay | Admitting: Emergency Medicine

## 2019-09-14 ENCOUNTER — Emergency Department (HOSPITAL_COMMUNITY)
Admission: EM | Admit: 2019-09-14 | Discharge: 2019-09-14 | Disposition: A | Discharge status: Home / Self Care | Payer: Self-pay | Attending: Emergency Medicine | Admitting: Emergency Medicine

## 2019-09-14 ENCOUNTER — Other Ambulatory Visit: Payer: Self-pay

## 2019-09-14 DIAGNOSIS — Z79899 Other long term (current) drug therapy: Secondary | ICD-10-CM | POA: Insufficient documentation

## 2019-09-14 DIAGNOSIS — F17210 Nicotine dependence, cigarettes, uncomplicated: Secondary | ICD-10-CM | POA: Insufficient documentation

## 2019-09-14 DIAGNOSIS — Z8659 Personal history of other mental and behavioral disorders: Secondary | ICD-10-CM

## 2019-09-14 DIAGNOSIS — Z760 Encounter for issue of repeat prescription: Secondary | ICD-10-CM | POA: Insufficient documentation

## 2019-09-14 DIAGNOSIS — F209 Schizophrenia, unspecified: Secondary | ICD-10-CM | POA: Insufficient documentation

## 2019-09-14 MED ORDER — RISPERIDONE 2 MG PO TABS
3.0000 mg | ORAL_TABLET | Freq: Once | ORAL | Status: AC
  Administered 2019-09-14: 3 mg via ORAL
  Filled 2019-09-14: qty 1

## 2019-09-14 MED ORDER — RISPERIDONE 1 MG PO TABS: 1.0000 mg | ORAL_TABLET | Freq: Every day | ORAL | 0 refills | Status: DC

## 2019-09-14 NOTE — ED Triage Notes (Signed)
Patient wants a refill of his risperdal.

## 2019-09-14 NOTE — ED Provider Notes (Signed)
[place] DEPT Provider Note   CSN: [id number] Arrival date & time: 09/14/19  0151     History   Chief Complaint Chief Complaint  Patient presents with  . Medication Refill    HPI Cole Barrett is a 33 y.o. male.     Patient with history of schizophrenia presents requesting medication refill. He is out of his Risperdal and states he wants to get a refill before he becomes symptomatic. He denies current hallucinations, SI/HI. He has no physical complaints.   The history is provided by the patient. No language interpreter was used.  Medication Refill   Past Medical History:  Diagnosis Date  . Homelessness   . Hypercholesterolemia   . Mental disorder   . Paranoid behavior ([place])   . PE (pulmonary embolism)   . Schizophrenia [facility])     Patient Active Problem List   Diagnosis Date Noted  . Acute psychosis ([place]) 08/31/2019  . Schizophrenia ([place]) 08/31/2019  . Schizoaffective disorder, bipolar type ([place]) 01/19/2019  . Alcohol abuse   . Auditory hallucinations   . Alcohol abuse with alcohol-induced mood disorder ([place]) 09/08/2016  . Homelessness 09/03/2016  . Person feigning illness 09/03/2016  . Brief psychotic disorder ([place]) 08/29/2016  . Cannabis use disorder, mild, abuse 08/29/2016  . Pulmonary emboli ([place]) 07/13/2016  . Adjustment disorder with emotional disturbance 03/12/2014  . Pulmonary embolism and infarction ([place]) 01/02/2014  . Acute left flank pain 01/02/2014  . Pulmonary embolism, bilateral ([place]) 01/02/2014    History reviewed. No pertinent surgical history.      Home Medications    Prior to Admission medications   Medication Sig Start Date End Date Taking? Authorizing Provider  amantadine (SYMMETREL) 100 MG capsule Take 1 capsule (100 mg total) by mouth 2 (two) times daily. 09/01/19   Mallie Darting, NP  risperiDONE (RISPERDAL) 1 MG tablet Take 1 tablet (1 mg total) by mouth daily. 09/02/19   Mallie Darting, NP  risperiDONE (RISPERDAL) 3 MG tablet Take 1 tablet (3 mg total) by mouth at bedtime. 05/14/19   Albesa Seen, PA-C    Family History Family History  Problem Relation Age of Onset  . Hypertension Mother     Social History Social History   Tobacco Use  . Smoking status: Current Every Day Smoker    Packs/day: 0.50    Types: Cigarettes  . Smokeless tobacco: Never Used  Substance Use Topics  . Alcohol use: Yes    Comment: BAC not available  . Drug use: Yes    Types: Marijuana    Comment: Last used: 2 months ago UDS NA     Allergies   Haldol [haloperidol]   Review of Systems Review of Systems  Constitutional: Negative for chills and fever.  HENT: Negative.   Respiratory: Negative.   Cardiovascular: Negative.   Gastrointestinal: Negative.   Musculoskeletal: Negative.   Skin: Negative.   Neurological: Negative.   Psychiatric/Behavioral: Negative for hallucinations.     Physical Exam Updated Vital Signs BP (!) 153/89 (BP Location: Left Arm)   Pulse 93   Temp 98.3 F (36.8 C) (Oral)   Resp 16   SpO2 97%   Physical Exam Constitutional:      Appearance: He is well-developed.  Neck:     Musculoskeletal: Normal range of motion.  Pulmonary:     Effort: Pulmonary effort is normal.  Musculoskeletal: Normal range of motion.  Skin:    General: Skin is warm and dry.  Neurological:  Mental Status: He is alert and oriented to person, place, and time.      ED Treatments / Results  Labs (all labs ordered are listed, but only abnormal results are displayed) Labs Reviewed - No data to display  EKG None  Radiology No results found.  Procedures Procedures (including critical care time)  Medications Ordered in ED Medications  risperiDONE (RISPERDAL) tablet 3 mg (has no administration in time range)     Initial Impression / Assessment and Plan / ED Course  I have reviewed the triage vital signs and the nursing notes.  Pertinent labs & imaging  results that were available during my care of the patient were reviewed by me and considered in my medical decision making (see chart for details).        Patient with h/o schizophrenia here for medication assistance. He states he is doing well, is not symptomatic, not suicidal or homicidal. He plans of follow up with [facility] for prescriptions.   Chart reviewed. He is prescribed Risperdal, 3 mg at night, 1 mg during the day. Will Rx enough for 4 days and strongly encourage follow up with [facility] as planned.   Final Clinical Impressions(s) / ED Diagnoses   Final diagnoses:  None   1. Medication refill 2. History of schizophrenia  ED Discharge Orders    None       Danne Harbor 09/14/19 0216    Mesner, Barbara Cower, MD 09/14/19 [phone]

## 2019-09-14 NOTE — Discharge Instructions (Addendum)
Please follow up with [facility] as planned on Monday for further medication refills.

## 2019-09-20 ENCOUNTER — Encounter (HOSPITAL_COMMUNITY): Payer: Self-pay | Admitting: Emergency Medicine

## 2019-09-20 ENCOUNTER — Emergency Department (HOSPITAL_COMMUNITY)
Admission: EM | Admit: 2019-09-20 | Discharge: 2019-09-20 | Disposition: A | Discharge status: Home / Self Care | Payer: Self-pay | Attending: Emergency Medicine | Admitting: Emergency Medicine

## 2019-09-20 ENCOUNTER — Other Ambulatory Visit: Payer: Self-pay

## 2019-09-20 DIAGNOSIS — Z5321 Procedure and treatment not carried out due to patient leaving prior to being seen by health care provider: Secondary | ICD-10-CM | POA: Insufficient documentation

## 2019-09-20 DIAGNOSIS — Z760 Encounter for issue of repeat prescription: Secondary | ICD-10-CM | POA: Insufficient documentation

## 2019-09-20 NOTE — ED Notes (Signed)
Pt called multiple time no answer for ready room

## 2019-09-20 NOTE — ED Triage Notes (Addendum)
Pt reports he is hearing voices that want to kill him which is baseline for him when he does not have his meds, states he is not SI or HI, then reports he is out of his risperdal and just needs a refill. Pt a/ox4, calm and cooperative.

## 2019-09-28 ENCOUNTER — Encounter (HOSPITAL_COMMUNITY): Payer: Self-pay | Admitting: Emergency Medicine

## 2019-09-28 ENCOUNTER — Emergency Department (HOSPITAL_COMMUNITY)
Admission: EM | Admit: 2019-09-28 | Discharge: 2019-09-28 | Disposition: A | Discharge status: Home / Self Care | Payer: Self-pay | Attending: Emergency Medicine | Admitting: Emergency Medicine

## 2019-09-28 ENCOUNTER — Other Ambulatory Visit: Payer: Self-pay

## 2019-09-28 DIAGNOSIS — Z79899 Other long term (current) drug therapy: Secondary | ICD-10-CM | POA: Insufficient documentation

## 2019-09-28 DIAGNOSIS — F17210 Nicotine dependence, cigarettes, uncomplicated: Secondary | ICD-10-CM | POA: Insufficient documentation

## 2019-09-28 DIAGNOSIS — Z590 Homelessness: Secondary | ICD-10-CM | POA: Insufficient documentation

## 2019-09-28 DIAGNOSIS — Z760 Encounter for issue of repeat prescription: Secondary | ICD-10-CM | POA: Insufficient documentation

## 2019-09-28 MED ORDER — RISPERIDONE 1 MG PO TABS: ORAL_TABLET | ORAL | 0 refills | Status: DC

## 2019-09-28 MED ORDER — RISPERIDONE 2 MG PO TABS
3.0000 mg | ORAL_TABLET | Freq: Once | ORAL | Status: AC
  Administered 2019-09-28: 3 mg via ORAL
  Filled 2019-09-28: qty 1

## 2019-09-28 NOTE — Discharge Instructions (Signed)
Thank you for allowing me to care for you today in the Emergency Department.   You have been given your nighttime dose of Risperdal.  I have given you a 4-day prescription of your daytime and nighttime dose until you can be seen by [facility].  Return to the emergency department if you have thoughts of wanting to hurt yourself, worsening auditory visual hallucinations, thoughts of wanting to hurt others, or other new, concerning symptoms.

## 2019-09-28 NOTE — ED Provider Notes (Signed)
[place] DEPT Provider Note   CSN: [id number] Arrival date & time: 09/28/19  0208     History   Chief Complaint Chief Complaint  Patient presents with  . Medication Refill    HPI Cole Barrett is a 33 y.o. male with history of homelessness, schizoaffective disorder who presents to the emergency department with a chief complaint of medication refill.  He reports that he has been out of his home respite all for 2 days.  He reports that he has started hearing voices, and wants to restart his medication before his symptoms worsen.  He denies any kill commands and auditory hallucinations have not been worsening since onset.  No visual hallucinations, SI, or HI.  He reports that he will intermittently get his prescriptions filled at [facility].  He plans to follow-up with them soon.     The history is provided by the patient. No language interpreter was used.    Past Medical History:  Diagnosis Date  . Homelessness   . Hypercholesterolemia   . Mental disorder   . Paranoid behavior ([place])   . PE (pulmonary embolism)   . Schizophrenia [facility])     Patient Active Problem List   Diagnosis Date Noted  . Acute psychosis ([place]) 08/31/2019  . Schizophrenia ([place]) 08/31/2019  . Schizoaffective disorder, bipolar type ([place]) 01/19/2019  . Alcohol abuse   . Auditory hallucinations   . Alcohol abuse with alcohol-induced mood disorder ([place]) 09/08/2016  . Homelessness 09/03/2016  . Person feigning illness 09/03/2016  . Brief psychotic disorder ([place]) 08/29/2016  . Cannabis use disorder, mild, abuse 08/29/2016  . Pulmonary emboli ([place]) 07/13/2016  . Adjustment disorder with emotional disturbance 03/12/2014  . Pulmonary embolism and infarction ([place]) 01/02/2014  . Acute left flank pain 01/02/2014  . Pulmonary embolism, bilateral ([place]) 01/02/2014    History reviewed. No pertinent surgical history.      Home Medications    Prior to Admission  medications   Medication Sig Start Date End Date Taking? Authorizing Provider  amantadine (SYMMETREL) 100 MG capsule Take 1 capsule (100 mg total) by mouth 2 (two) times daily. 09/01/19   Mallie Darting, NP  risperiDONE (RISPERDAL) 1 MG tablet Take 1 tablet by mouth in the morning and take 3 tablets at bedtime daily. 09/28/19   McDonald, Mia A, PA-C    Family History Family History  Problem Relation Age of Onset  . Hypertension Mother     Social History Social History   Tobacco Use  . Smoking status: Current Every Day Smoker    Packs/day: 0.50    Types: Cigarettes  . Smokeless tobacco: Never Used  Substance Use Topics  . Alcohol use: Yes    Comment: BAC not available  . Drug use: Yes    Types: Marijuana    Comment: Last used: 2 months ago UDS NA     Allergies   Haldol [haloperidol]   Review of Systems Review of Systems  Constitutional: Negative for activity change.  Respiratory: Negative for shortness of breath.   Cardiovascular: Negative for chest pain.  Gastrointestinal: Negative for abdominal pain.  Musculoskeletal: Negative for back pain.  Skin: Negative for rash.  Psychiatric/Behavioral: Positive for hallucinations. Negative for decreased concentration, self-injury and suicidal ideas.     Physical Exam Updated Vital Signs BP (!) 155/78 (BP Location: Right Arm)   Pulse 84   Temp 97.9 F (36.6 C) (Oral)   Resp 16   Ht 5\' 10"  (1.778 m)   Wt  102.1 kg   SpO2 97%   BMI 32.28 kg/m   Physical Exam Vitals signs and nursing note reviewed.  Constitutional:      Appearance: He is well-developed.  HENT:     Head: Normocephalic.  Eyes:     Conjunctiva/sclera: Conjunctivae normal.  Neck:     Musculoskeletal: Neck supple.  Cardiovascular:     Rate and Rhythm: Normal rate and regular rhythm.     Heart sounds: No murmur.  Pulmonary:     Effort: Pulmonary effort is normal.  Abdominal:     General: There is no distension.     Palpations: Abdomen is soft.   Skin:    General: Skin is warm and dry.  Neurological:     Mental Status: He is alert.     Comments: Ambulatory without difficulty  Psychiatric:        Attention and Perception: He is attentive.        Speech: Speech normal.        Behavior: Behavior normal.        Thought Content: Thought content does not include homicidal or suicidal ideation. Thought content does not include homicidal or suicidal plan.      ED Treatments / Results  Labs (all labs ordered are listed, but only abnormal results are displayed) Labs Reviewed - No data to display  EKG None  Radiology No results found.  Procedures Procedures (including critical care time)  Medications Ordered in ED Medications  risperiDONE (RISPERDAL) tablet 3 mg (has no administration in time range)     Initial Impression / Assessment and Plan / ED Course  I have reviewed the triage vital signs and the nursing notes.  Pertinent labs & imaging results that were available during my care of the patient were reviewed by me and considered in my medical decision making (see chart for details).        33 year old male with a history of schizoaffective disorder and homelessness who is well-known to this emergency department with 19 visits in the last 6 months.  He reports that he has started to have auditory hallucinations are because he has been out of his home Risperdal for 2 days.  He has been unable to follow-up with [facility].  He has been given his nighttime dose in the ER.  I will provide him with a short 4-day course until he is able to follow-up with [facility].  He was strongly encouraged by me to ensure that he is following up with [facility] to ensure that he is regularly receiving his medication.  He has no HI, SI, or visual hallucinations at this time.  On exam, he is appropriate and does not appear to be responding to external stimuli.  He is hemodynamically stable and in no acute distress.  Safe for discharge with follow-up  to [facility] at this time.  Final Clinical Impressions(s) / ED Diagnoses   Final diagnoses:  Encounter for medication refill    ED Discharge Orders         Ordered    risperiDONE (RISPERDAL) 1 MG tablet     09/28/19 0609           Frederik Pear A, PA-C 09/28/19 3382    Devoria Albe, MD 09/28/19 [phone]

## 2019-09-28 NOTE — ED Triage Notes (Signed)
Patient states that he ran out of his Risperdal two days ago and has since started hearing voices.Pateint denies SI and HI at this time. Patient would like a refill for his medication at this time.

## 2019-09-30 ENCOUNTER — Other Ambulatory Visit: Payer: Self-pay

## 2019-09-30 ENCOUNTER — Emergency Department (HOSPITAL_COMMUNITY)
Admission: EM | Admit: 2019-09-30 | Discharge: 2019-09-30 | Disposition: A | Discharge status: Home / Self Care | Payer: Self-pay | Attending: Emergency Medicine | Admitting: Emergency Medicine

## 2019-09-30 ENCOUNTER — Encounter (HOSPITAL_COMMUNITY): Payer: Self-pay

## 2019-09-30 DIAGNOSIS — F259 Schizoaffective disorder, unspecified: Secondary | ICD-10-CM | POA: Insufficient documentation

## 2019-09-30 DIAGNOSIS — Z79899 Other long term (current) drug therapy: Secondary | ICD-10-CM | POA: Insufficient documentation

## 2019-09-30 DIAGNOSIS — F17210 Nicotine dependence, cigarettes, uncomplicated: Secondary | ICD-10-CM | POA: Insufficient documentation

## 2019-09-30 DIAGNOSIS — Z760 Encounter for issue of repeat prescription: Secondary | ICD-10-CM | POA: Insufficient documentation

## 2019-09-30 DIAGNOSIS — Z590 Homelessness: Secondary | ICD-10-CM | POA: Insufficient documentation

## 2019-09-30 MED ORDER — RISPERIDONE 1 MG PO TABS
1.0000 mg | ORAL_TABLET | Freq: Once | ORAL | Status: AC
  Administered 2019-09-30: 04:00:00 1 mg via ORAL
  Filled 2019-09-30: qty 1

## 2019-09-30 NOTE — ED Triage Notes (Signed)
Pt arrived stating he needs his Risperdal that he was unable to get it from [place] until tomorrow. States he is not hearing voices yet. Denies SI and  HI

## 2019-09-30 NOTE — ED Provider Notes (Signed)
[place] DEPT Provider Note   CSN: [id number] Arrival date & time: 09/30/19  0128     History   Chief Complaint Chief Complaint  Patient presents with  . Medication Refill    HPI Divonte Senger is a 33 y.o. male.     The history is provided by the patient.  Medication Refill Medications/supplies requested:  Risperdal Reason for request:  Clinic/provider not available Medications taken before: no   Patient has complete original prescription information: yes   Out of his risperdal and seen here and did not fill the RX.  No acute symptoms at this time.  Past Medical History:  Diagnosis Date  . Homelessness   . Hypercholesterolemia   . Mental disorder   . Paranoid behavior ([place])   . PE (pulmonary embolism)   . Schizophrenia [facility])     Patient Active Problem List   Diagnosis Date Noted  . Acute psychosis ([place]) 08/31/2019  . Schizophrenia ([place]) 08/31/2019  . Schizoaffective disorder, bipolar type ([place]) 01/19/2019  . Alcohol abuse   . Auditory hallucinations   . Alcohol abuse with alcohol-induced mood disorder ([place]) 09/08/2016  . Homelessness 09/03/2016  . Person feigning illness 09/03/2016  . Brief psychotic disorder ([place]) 08/29/2016  . Cannabis use disorder, mild, abuse 08/29/2016  . Pulmonary emboli ([place]) 07/13/2016  . Adjustment disorder with emotional disturbance 03/12/2014  . Pulmonary embolism and infarction ([place]) 01/02/2014  . Acute left flank pain 01/02/2014  . Pulmonary embolism, bilateral ([place]) 01/02/2014    History reviewed. No pertinent surgical history.      Home Medications    Prior to Admission medications   Medication Sig Start Date End Date Taking? Authorizing Provider  amantadine (SYMMETREL) 100 MG capsule Take 1 capsule (100 mg total) by mouth 2 (two) times daily. 09/01/19   Mallie Darting, NP  risperiDONE (RISPERDAL) 1 MG tablet Take 1 tablet by mouth in the morning and take 3 tablets at bedtime  daily. 09/28/19   McDonald, Mia A, PA-C    Family History Family History  Problem Relation Age of Onset  . Hypertension Mother     Social History Social History   Tobacco Use  . Smoking status: Current Every Day Smoker    Packs/day: 0.50    Types: Cigarettes  . Smokeless tobacco: Never Used  Substance Use Topics  . Alcohol use: Yes    Comment: BAC not available  . Drug use: Yes    Types: Marijuana    Comment: Last used: 2 months ago UDS NA     Allergies   Haldol [haloperidol]   Review of Systems Review of Systems  Constitutional: Negative for unexpected weight change.  HENT: Negative for congestion.   Eyes: Negative for visual disturbance.  Respiratory: Negative for shortness of breath.   Cardiovascular: Negative for chest pain.  Gastrointestinal: Negative for abdominal pain.  Endocrine: Negative for polyuria.  Genitourinary: Negative for difficulty urinating.  Musculoskeletal: Negative for arthralgias.  Neurological: Negative for weakness.  Psychiatric/Behavioral: Negative for agitation.  All other systems reviewed and are negative.    Physical Exam Updated Vital Signs BP 135/84 (BP Location: Left Arm)   Pulse 84   Temp 98.4 F (36.9 C) (Oral)   Resp 18   SpO2 99%   Physical Exam Vitals signs and nursing note reviewed.  Constitutional:      General: He is not in acute distress.    Appearance: He is normal weight.  HENT:     Head: Normocephalic  and atraumatic.     Nose: Nose normal.     Mouth/Throat:     Mouth: Mucous membranes are moist.     Pharynx: Oropharynx is clear.  Eyes:     Conjunctiva/sclera: Conjunctivae normal.     Pupils: Pupils are equal, round, and reactive to light.  Neck:     Musculoskeletal: Normal range of motion and neck supple.  Cardiovascular:     Rate and Rhythm: Normal rate and regular rhythm.     Pulses: Normal pulses.     Heart sounds: Normal heart sounds.  Pulmonary:     Effort: Pulmonary effort is normal.      Breath sounds: Normal breath sounds.  Abdominal:     General: Abdomen is flat. Bowel sounds are normal.     Tenderness: There is no abdominal tenderness. There is no guarding.  Musculoskeletal: Normal range of motion.  Skin:    General: Skin is warm and dry.     Capillary Refill: Capillary refill takes less than 2 seconds.  Neurological:     General: No focal deficit present.     Mental Status: He is alert and oriented to person, place, and time.  Psychiatric:        Mood and Affect: Mood normal.        Behavior: Behavior normal.      ED Treatments / Results  Labs (all labs ordered are listed, but only abnormal results are displayed) Labs Reviewed - No data to display  EKG None  Radiology No results found.  Procedures Procedures (including critical care time)  Medications Ordered in ED Medications  risperiDONE (RISPERDAL) tablet 1 mg (has no administration in time range)     Initial Impression / Assessment and Plan / ED Course  Will give one dose here.  He needs to fill the medication as directed.    Briggs Edelen was evaluated in Emergency Department on 09/30/2019 for the symptoms described in the history of present illness. He was evaluated in the context of the global COVID-19 pandemic, which necessitated consideration that the patient might be at risk for infection with the SARS-CoV-2 virus that causes COVID-19. Institutional protocols and algorithms that pertain to the evaluation of patients at risk for COVID-19 are in a state of rapid change based on information released by regulatory bodies including the CDC and federal and state organizations. These policies and algorithms were followed during the patient's care in the ED.   Final Clinical Impressions(s) / ED Diagnoses   Return for weakness, numbness, changes in vision or speech, fevers >100.4 unrelieved by medication, shortness of breath, intractable vomiting, or diarrhea, abdominal pain, Inability  to tolerate liquids or food, cough, altered mental status or any concerns. No signs of systemic illness or infection. The patient is nontoxic-appearing on exam and vital signs are within normal limits.   I have reviewed the triage vital signs and the nursing notes. Pertinent labs &imaging results that were available during my care of the patient were reviewed by me and considered in my medical decision making (see chart for details).  After history, exam, and medical workup I feel the patient has been appropriately medically screened and is safe for discharge home. Pertinent diagnoses were discussed with the patient. Patient was given return precautions     Logyn Kendrick, MD 09/30/19 3557

## 2019-10-11 DIAGNOSIS — Z760 Encounter for issue of repeat prescription: Secondary | ICD-10-CM | POA: Insufficient documentation

## 2019-10-11 DIAGNOSIS — F17210 Nicotine dependence, cigarettes, uncomplicated: Secondary | ICD-10-CM | POA: Insufficient documentation

## 2019-10-11 DIAGNOSIS — F1210 Cannabis abuse, uncomplicated: Secondary | ICD-10-CM | POA: Insufficient documentation

## 2019-10-11 DIAGNOSIS — Z79899 Other long term (current) drug therapy: Secondary | ICD-10-CM | POA: Insufficient documentation

## 2019-10-11 DIAGNOSIS — R440 Auditory hallucinations: Secondary | ICD-10-CM | POA: Insufficient documentation

## 2019-10-12 ENCOUNTER — Encounter (HOSPITAL_COMMUNITY): Payer: Self-pay | Admitting: Emergency Medicine

## 2019-10-12 ENCOUNTER — Emergency Department (HOSPITAL_COMMUNITY)
Admission: EM | Admit: 2019-10-12 | Discharge: 2019-10-12 | Disposition: A | Discharge status: Home / Self Care | Payer: Self-pay | Attending: Emergency Medicine | Admitting: Emergency Medicine

## 2019-10-12 DIAGNOSIS — R443 Hallucinations, unspecified: Secondary | ICD-10-CM

## 2019-10-12 DIAGNOSIS — Z760 Encounter for issue of repeat prescription: Secondary | ICD-10-CM

## 2019-10-12 MED ORDER — RISPERIDONE 1 MG PO TABS
1.0000 mg | ORAL_TABLET | Freq: Once | ORAL | Status: AC
  Administered 2019-10-12: 03:00:00 1 mg via ORAL
  Filled 2019-10-12: qty 1

## 2019-10-12 MED ORDER — RISPERIDONE 1 MG PO TABS: ORAL_TABLET | ORAL | 0 refills | Status: DC

## 2019-10-12 NOTE — Discharge Instructions (Addendum)
Follow up with [facility] 

## 2019-10-12 NOTE — ED Triage Notes (Signed)
Patient here from home with complaints of hallucinations. States that he is hearing voices. Reports that the voices are just loud but are saying "a lot of things".

## 2019-10-12 NOTE — ED Provider Notes (Signed)
[facility]-EMERGENCY DEPT Provider Note   CSN: [id number] Arrival date & time: 10/11/19  2356     History   Chief Complaint Chief Complaint  Patient presents with  . Hallucinations    HPI Cole Barrett is a 33 y.o. male.     33yo male presents with request for a dose of Respirdol. Patient states he has been out of his medicaiton for unknown amount of time, is hearing voices saying they are going to kill him. Patient denies SI/HI. No other complaints or concerns today.      Past Medical History:  Diagnosis Date  . Homelessness   . Hypercholesterolemia   . Mental disorder   . Paranoid behavior (HCC)   . PE (pulmonary embolism)   . Schizophrenia [facility])     Patient Active Problem List   Diagnosis Date Noted  . Acute psychosis (HCC) 08/31/2019  . Schizophrenia (HCC) 08/31/2019  . Schizoaffective disorder, bipolar type (HCC) 01/19/2019  . Alcohol abuse   . Auditory hallucinations   . Alcohol abuse with alcohol-induced mood disorder (HCC) 09/08/2016  . Homelessness 09/03/2016  . Person feigning illness 09/03/2016  . 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 Cannabis use disorder, mild, abuse 08/29/2016  . Pulmonary emboli (HCC) 07/13/2016  . Adjustment disorder with emotional disturbance 03/12/2014  . 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 Acute left flank pain 01/02/2014  . Pulmonary embolism, bilateral (HCC) 01/02/2014    History reviewed. No pertinent surgical history.      Home Medications    Prior to Admission medications   Medication Sig Start Date End Date Taking? Authorizing Provider  amantadine (SYMMETREL) 100 MG capsule Take 1 capsule (100 mg total) by mouth 2 (two) times daily. 09/01/19  Yes Ophelia Shoulder E, NP  risperiDONE (RISPERDAL) 1 MG tablet Take 1 tablet by mouth in the morning and take 3 tablets at bedtime daily. 10/12/19   Jeannie Fend, PA-C    Family History Family History  Problem  Relation Age of Onset  . Hypertension Mother     Social History Social History   Tobacco Use  . Smoking status: Current Every Day Smoker    Packs/day: 0.50    Types: Cigarettes  . Smokeless tobacco: Never Used  Substance Use Topics  . Alcohol use: Yes    Comment: BAC not available  . Drug use: Yes    Types: Marijuana    Comment: Last used: 2 months ago UDS NA     Allergies   Haldol [haloperidol]   Review of Systems Review of Systems  Constitutional: Negative for fever.  Respiratory: Negative for shortness of breath.   Cardiovascular: Negative for chest pain.  Gastrointestinal: Negative for abdominal pain.  Skin: Negative for wound.  Allergic/Immunologic: Negative for immunocompromised state.  Psychiatric/Behavioral: Positive for hallucinations. Negative for suicidal ideas. The patient is not nervous/anxious.   All other systems reviewed and are negative.    Physical Exam Updated Vital Signs BP 119/74 (BP Location: Left Arm)   Pulse 81   Temp 97.8 F (36.6 C) (Oral)   Resp 18   SpO2 96%   Physical Exam Vitals signs and nursing note reviewed.  Constitutional:      General: He is not in acute distress.    Appearance: He is well-developed. He is not diaphoretic.  HENT:     Head: Normocephalic and atraumatic.  Eyes:     Conjunctiva/sclera:     Right eye: Right conjunctiva is injected.  Left eye: Left conjunctiva is injected.  Cardiovascular:     Rate and Rhythm: Normal rate and regular rhythm.     Pulses: Normal pulses.     Heart sounds: Normal heart sounds.  Pulmonary:     Effort: Pulmonary effort is normal.     Breath sounds: Normal breath sounds.  Skin:    General: Skin is warm and dry.     Findings: No erythema or rash.  Neurological:     Mental Status: He is alert and oriented to person, place, and time.  Psychiatric:        Behavior: Behavior normal. Behavior is not agitated, aggressive or hyperactive.        Thought Content: Thought content  does not include homicidal or suicidal ideation.     Comments: Sleeping, easily aroused and subsequently awake.      ED Treatments / Results  Labs (all labs ordered are listed, but only abnormal results are displayed) Labs Reviewed - No data to display  EKG None  Radiology No results found.  Procedures Procedures (including critical care time)  Medications Ordered in ED Medications  risperiDONE (RISPERDAL) tablet 1 mg (has no administration in time range)     Initial Impression / Assessment and Plan / ED Course  I have reviewed the triage vital signs and the nursing notes.  Pertinent labs & imaging results that were available during my care of the patient were reviewed by me and considered in my medical decision making (see chart for details).  Clinical Course as of Oct 11 304  Sat Oct 12, 4919  7023 33 year old male with history of schizophrenia presents with request for a dose of Risperdal.  Patient reports running out of his medication, states that he is hearing voices stating they are going to kill him.  Patient denies feeling suicidal or homicidal and is feeling otherwise safe.  Patient was given dose of Risperdal in the ER and discharged with prescription for same advised to follow-up with [facility].   [LM]    Clinical Course User Index [LM] Tacy Learn, PA-C      Final Clinical Impressions(s) / ED Diagnoses   Final diagnoses:  Hallucinations  Medication refill    ED Discharge Orders         Ordered    risperiDONE (RISPERDAL) 1 MG tablet     10/12/19 0249           Tacy Learn, PA-C 10/12/19 0305    Fatima Blank, MD 10/12/19 [phone]

## 2019-10-23 ENCOUNTER — Emergency Department (HOSPITAL_COMMUNITY)
Admission: EM | Admit: 2019-10-23 | Discharge: 2019-10-23 | Disposition: A | Discharge status: Home / Self Care | Payer: Self-pay | Attending: Emergency Medicine | Admitting: Emergency Medicine

## 2019-10-23 ENCOUNTER — Other Ambulatory Visit: Payer: Self-pay

## 2019-10-23 ENCOUNTER — Encounter (HOSPITAL_COMMUNITY): Payer: Self-pay

## 2019-10-23 DIAGNOSIS — R440 Auditory hallucinations: Secondary | ICD-10-CM | POA: Insufficient documentation

## 2019-10-23 DIAGNOSIS — F17210 Nicotine dependence, cigarettes, uncomplicated: Secondary | ICD-10-CM | POA: Insufficient documentation

## 2019-10-23 DIAGNOSIS — Z590 Homelessness: Secondary | ICD-10-CM | POA: Insufficient documentation

## 2019-10-23 DIAGNOSIS — Z79899 Other long term (current) drug therapy: Secondary | ICD-10-CM | POA: Insufficient documentation

## 2019-10-23 DIAGNOSIS — F209 Schizophrenia, unspecified: Secondary | ICD-10-CM | POA: Insufficient documentation

## 2019-10-23 MED ORDER — RISPERIDONE 2 MG PO TABS
3.0000 mg | ORAL_TABLET | Freq: Once | ORAL | Status: AC
  Administered 2019-10-23: 3 mg via ORAL
  Filled 2019-10-23: qty 1

## 2019-10-23 MED ORDER — RISPERIDONE 1 MG PO TABS: ORAL_TABLET | ORAL | 0 refills | Status: DC

## 2019-10-23 NOTE — ED Triage Notes (Signed)
Pt coming here c/o needing medication refill. No HI/SI. Pt is hearing voices.

## 2019-10-23 NOTE — ED Provider Notes (Signed)
[facility]-EMERGENCY DEPT Provider Note   CSN: [id number] Arrival date & time: 10/23/19  0018     History   Chief Complaint Chief Complaint  Patient presents with  . Medical Clearance    HPI Cole Barrett is a 33 y.o. male.     33 year old male with a history of homelessness, schizophrenia presents to the emergency department for auditory hallucinations.  He chronically experiences auditory hallucinations.  Describes voices saying they are going to kill him.  Denies any suicidal or homicidal thoughts.  Has been off his Risperdal for a while.  This medication usually helps lessen his hallucinations.  States that he attempted to follow-up with [facility], but is unable to get an appointment until next month.  The history is provided by the patient. No language interpreter was used.    Past Medical History:  Diagnosis Date  . Homelessness   . Hypercholesterolemia   . Mental disorder   . Paranoid behavior (HCC)   . PE (pulmonary embolism)   . Schizophrenia [facility])     Patient Active Problem List   Diagnosis Date Noted  . Acute psychosis (HCC) 08/31/2019  . Schizophrenia (HCC) 08/31/2019  . Schizoaffective disorder, bipolar type (HCC) 01/19/2019  . Alcohol abuse   . Auditory hallucinations   . Alcohol abuse with alcohol-induced mood disorder (HCC) 09/08/2016  . Homelessness 09/03/2016  . Person feigning illness 09/03/2016  . 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 Cannabis use disorder, mild, abuse 08/29/2016  . Pulmonary emboli (HCC) 07/13/2016  . Adjustment disorder with emotional disturbance 03/12/2014  . 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 Acute left flank pain 01/02/2014  . Pulmonary embolism, bilateral (HCC) 01/02/2014    History reviewed. No pertinent surgical history.      Home Medications    Prior to Admission medications   Medication Sig Start Date End Date Taking? Authorizing Provider  amantadine  (SYMMETREL) 100 MG capsule Take 1 capsule (100 mg total) by mouth 2 (two) times daily. 09/01/19   [name], NP  risperiDONE (RISPERDAL) 1 MG tablet Take 1 tablet by mouth in the morning and take 3 tablets at bedtime daily. 10/23/19   [name], PA-C    Family History Family History  Problem Relation Age of Onset  . Hypertension Mother     Social History Social History   Tobacco Use  . Smoking status: Current Every Day Smoker    Packs/day: 0.50    Types: Cigarettes  . Smokeless tobacco: Never Used  Substance Use Topics  . Alcohol use: Yes    Comment: BAC not available  . Drug use: Yes    Types: Marijuana    Comment: Last used: 2 months ago UDS NA     Allergies   Haldol [haloperidol]   Review of Systems Review of Systems Ten systems reviewed and are negative for acute change, except as noted in the HPI.    Physical Exam Updated Vital Signs There were no vitals taken for this visit.  Physical Exam Vitals signs and nursing note reviewed.  Constitutional:      General: He is not in acute distress.    Appearance: He is well-developed. He is not diaphoretic.     Comments: Nontoxic-appearing.  Disheveled, malodorous.  HENT:     Head: Normocephalic and atraumatic.  Eyes:     General: No scleral icterus.    Conjunctiva/sclera: Conjunctivae normal.  Neck:     Musculoskeletal: Normal range of motion.  Pulmonary:  Effort: Pulmonary effort is normal. No respiratory distress.     Comments: Respirations even and unlabored Musculoskeletal: Normal range of motion.  Skin:    General: Skin is warm and dry.     Coloration: Skin is not pale.     Findings: No erythema or rash.  Neurological:     Mental Status: He is alert and oriented to person, place, and time.  Psychiatric:        Behavior: Behavior normal.     Comments: Patient not reacting to internal stimuli      ED Treatments / Results  Labs (all labs ordered are listed, but only abnormal results are  displayed) Labs Reviewed - No data to display  EKG None  Radiology No results found.  Procedures Procedures (including critical care time)  Medications Ordered in ED Medications  risperiDONE (RISPERDAL) tablet 3 mg (has no administration in time range)     Initial Impression / Assessment and Plan / ED Course  I have reviewed the triage vital signs and the nursing notes.  Pertinent labs & imaging results that were available during my care of the patient were reviewed by me and considered in my medical decision making (see chart for details).        33 year old male presenting for auditory hallucinations as he is out of his Risperdal.  Reports inability to follow-up with [facility].  No suicidal or homicidal thoughts.  Not presently reacting to internal stimuli.  History of homelessness.  Presents disheveled and malodorous, but nontoxic.  Will give 2-week supply of psychiatric medication with instruction to follow-up with [facility] as before.  Return precautions discussed and provided. Patient discharged in stable condition with no unaddressed concerns.   Final Clinical Impressions(s) / ED Diagnoses   Final diagnoses:  Continuous auditory hallucinations    ED Discharge Orders         Ordered    risperiDONE (RISPERDAL) 1 MG tablet     10/23/19 0055           Antonietta Breach, PA-C 10/23/19 0059    Mesner, Corene Cornea, MD 10/23/19 [phone]

## 2019-10-23 NOTE — Discharge Instructions (Signed)
Take your Risperdal as prescribed and follow-up with [facility] for evaluation of your ongoing hallucinations

## 2019-10-23 NOTE — ED Notes (Signed)
Pt verbalized discharge instructions and follow up care. Alert and ambulatory. No IV. Leaving with all belongings.  

## 2019-10-25 ENCOUNTER — Other Ambulatory Visit: Payer: Self-pay

## 2019-10-25 ENCOUNTER — Emergency Department (HOSPITAL_COMMUNITY)
Admission: EM | Admit: 2019-10-25 | Discharge: 2019-10-26 | Disposition: A | Discharge status: Home / Self Care | Payer: Self-pay | Attending: Emergency Medicine | Admitting: Emergency Medicine

## 2019-10-25 ENCOUNTER — Encounter (HOSPITAL_COMMUNITY): Payer: Self-pay | Admitting: Emergency Medicine

## 2019-10-25 DIAGNOSIS — R440 Auditory hallucinations: Secondary | ICD-10-CM | POA: Insufficient documentation

## 2019-10-25 DIAGNOSIS — F17210 Nicotine dependence, cigarettes, uncomplicated: Secondary | ICD-10-CM | POA: Insufficient documentation

## 2019-10-25 DIAGNOSIS — Z590 Homelessness: Secondary | ICD-10-CM | POA: Insufficient documentation

## 2019-10-25 NOTE — ED Triage Notes (Signed)
Pt states he is having auditory hallucination and will like to be check denies any SI though.

## 2019-10-26 LAB — COMPREHENSIVE METABOLIC PANEL
ALT: 43 U/L (ref 0–44)
AST: 27 U/L (ref 15–41)
Albumin: 3.9 g/dL (ref 3.5–5.0)
Alkaline Phosphatase: 41 U/L (ref 38–126)
Anion gap: 15 (ref 5–15)
BUN: 10 mg/dL (ref 6–20)
CO2: 22 mmol/L (ref 22–32)
Calcium: 8.9 mg/dL (ref 8.9–10.3)
Chloride: 102 mmol/L (ref 98–111)
Creatinine, Ser: 0.97 mg/dL (ref 0.61–1.24)
GFR calc Af Amer: >60 mL/min (ref >60)
GFR calc non Af Amer: >60 mL/min (ref >60)
Glucose, Bld: 112 mg/dL — ABNORMAL HIGH (ref 70–99)
Potassium: 3.4 mmol/L — ABNORMAL LOW (ref 3.5–5.1)
Sodium: 139 mmol/L (ref 135–145)
Total Bilirubin: 0.4 mg/dL (ref 0.3–1.2)
Total Protein: 7.1 g/dL (ref 6.5–8.1)

## 2019-10-26 LAB — CBC
HCT: 40.2 % (ref 39.0–52.0)
Hemoglobin: 12.9 g/dL — ABNORMAL LOW (ref 13.0–17.0)
MCH: 28.7 pg (ref 26.0–34.0)
MCHC: 32.1 g/dL (ref 30.0–36.0)
MCV: 89.5 fL (ref 80.0–100.0)
Platelets: 308 10*3/uL (ref 150–400)
RBC: 4.49 MIL/uL (ref 4.22–5.81)
RDW: 12.8 % (ref 11.5–15.5)
WBC: 5.3 10*3/uL (ref 4.0–10.5)
nRBC: 0 % (ref 0.0–0.2)

## 2019-10-26 LAB — ETHANOL: Alcohol, Ethyl (B): 84 mg/dL — ABNORMAL HIGH (ref <10)

## 2019-10-26 LAB — RAPID URINE DRUG SCREEN, HOSP PERFORMED
Amphetamines: NOT DETECTED
Barbiturates: NOT DETECTED
Benzodiazepines: NOT DETECTED
Cocaine: NOT DETECTED
Opiates: NOT DETECTED
Tetrahydrocannabinol: NOT DETECTED

## 2019-10-26 LAB — ACETAMINOPHEN LEVEL: Acetaminophen (Tylenol), Serum: <10 ug/mL — ABNORMAL LOW (ref 10–30)

## 2019-10-26 LAB — SALICYLATE LEVEL: Salicylate Lvl: <7 mg/dL (ref 2.8–30.0)

## 2019-10-26 MED ORDER — RISPERIDONE 1 MG PO TABS
1.0000 mg | ORAL_TABLET | Freq: Once | ORAL | Status: AC
  Administered 2019-10-26: 1 mg via ORAL
  Filled 2019-10-26: qty 1

## 2019-10-26 NOTE — ED Notes (Signed)
Pt is in green hallway, unable to obtain vitals check

## 2019-10-26 NOTE — ED Notes (Signed)
Pt discharge instructions reviewed. Pt verbalized understanding of discharge instructions. Pt discharged. 

## 2019-10-26 NOTE — ED Provider Notes (Signed)
[facility] EMERGENCY DEPARTMENT Provider Note   CSN: [id number] Arrival date & time: 10/25/19  2219     History   Chief Complaint Chief Complaint  Patient presents with  . Medical Clearance    HPI Cole Barrett is a 33 y.o. male.     HPI Patient presents with concern of auditory hallucinations, which have resolved prior to my evaluation.  Patient states" I am good", noting that after sleeping, while awaiting evaluation space, his auditory hallucinations have stopped, and he has none of these, nor any physical complaints, nor any thoughts of suicidal or homicidal ideation. We discussed the importance of taking medication as directed, which he notes he has been attempting to do, though he did develop auditory hallucinations, possibly in spite of this. He denies any other recent changes in his physical condition, or complaints. He again reiterates no suicidal or homicidal ideation, no physical pain. Past Medical History:  Diagnosis Date  . Homelessness   . Hypercholesterolemia   . Mental disorder   . Paranoid behavior (HCC)   . PE (pulmonary embolism)   . Schizophrenia [facility])     Patient Active Problem List   Diagnosis Date Noted  . Acute psychosis (HCC) 08/31/2019  . Schizophrenia (HCC) 08/31/2019  . Schizoaffective disorder, bipolar type (HCC) 01/19/2019  . Alcohol abuse   . Auditory hallucinations   . Alcohol abuse with alcohol-induced mood disorder (HCC) 09/08/2016  . Homelessness 09/03/2016  . Person feigning illness 09/03/2016  . 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 Cannabis use disorder, mild, abuse 08/29/2016  . Pulmonary emboli (HCC) 07/13/2016  . Adjustment disorder with emotional disturbance 03/12/2014  . 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 Acute left flank pain 01/02/2014  . Pulmonary embolism, bilateral (HCC) 01/02/2014    History reviewed. No pertinent surgical history.      Home Medications     Prior to Admission medications   Medication Sig Start Date End Date Taking? Authorizing Provider  amantadine (SYMMETREL) 100 MG capsule Take 1 capsule (100 mg total) by mouth 2 (two) times daily. 09/01/19   [name], NP  risperiDONE (RISPERDAL) 1 MG tablet Take 1 tablet by mouth in the morning and take 3 tablets at bedtime daily. 10/23/19   [name], PA-C    Family History Family History  Problem Relation Age of Onset  . Hypertension Mother     Social History Social History   Tobacco Use  . Smoking status: Current Every Day Smoker    Packs/day: 0.50    Types: Cigarettes  . Smokeless tobacco: Never Used  Substance Use Topics  . Alcohol use: Yes    Comment: BAC not available  . Drug use: Yes    Types: Marijuana    Comment: Last used: 2 months ago UDS NA     Allergies   Haldol [haloperidol]   Review of Systems Review of Systems  Constitutional:       Per HPI, otherwise negative  HENT:       Per HPI, otherwise negative  Respiratory:       Per HPI, otherwise negative  Cardiovascular:       Per HPI, otherwise negative  Gastrointestinal: Negative for vomiting.  Endocrine:       Negative aside from HPI  Genitourinary:       Neg aside from HPI   Musculoskeletal:       Per HPI, otherwise negative  Skin: Negative.   Neurological: Negative for syncope.  Psychiatric/Behavioral: Positive for dysphoric mood and hallucinations. Negative for suicidal ideas.     Physical Exam Updated Vital Signs BP (!) 140/98 (BP Location: Left Arm)   Pulse 83   Temp 98.3 F (36.8 C) (Oral)   Resp 18   SpO2 99%   Physical Exam Vitals signs and nursing note reviewed.  Constitutional:      General: He is not in acute distress.    Appearance: He is well-developed.  HENT:     Head: Normocephalic and atraumatic.  Eyes:     Conjunctiva/sclera: Conjunctivae normal.  Pulmonary:     Effort: Pulmonary effort is normal. No respiratory distress.     Breath sounds: No  stridor.  Abdominal:     General: There is no distension.  Skin:    General: Skin is warm and dry.  Neurological:     Mental Status: He is alert and oriented to person, place, and time.  Psychiatric:        Thought Content: Thought content is not delusional. Thought content does not include homicidal or suicidal ideation. Thought content does not include suicidal plan.     Comments: Patient has some insight into his episodic hallucinations, he is need to maintain good medication compliance and sleep habits to minimize symptoms.      ED Treatments / Results  Labs (all labs ordered are listed, but only abnormal results are displayed) Labs Reviewed  COMPREHENSIVE METABOLIC PANEL - Abnormal; Notable for the following components:      Result Value   Potassium 3.4 (*)    Glucose, Bld 112 (*)    All other components within normal limits  ETHANOL - Abnormal; Notable for the following components:   Alcohol, Ethyl (B) 84 (*)    All other components within normal limits  ACETAMINOPHEN LEVEL - Abnormal; Notable for the following components:   Acetaminophen (Tylenol), Serum <10 (*)    All other components within normal limits  CBC - Abnormal; Notable for the following components:   Hemoglobin 12.9 (*)    All other components within normal limits  SALICYLATE LEVEL  RAPID URINE DRUG SCREEN, HOSP PERFORMED     Procedures Procedures (including critical care time)  Medications Ordered in ED Medications  risperiDONE (RISPERDAL) tablet 1 mg (has no administration in time range)     Initial Impression / Assessment and Plan / ED Course  I have reviewed the triage vital signs and the nursing notes.  Pertinent labs & imaging results that were available during my care of the patient were reviewed by me and considered in my medical decision making (see chart for details).    Adult male with multiple medical issues including psychiatric disease presents with auditory hallucinations which  resolved after initial meds provided here. Patient has no suicidal, no homicidal ideation, no physical complaints, no hemodynamic instability, and with improvement in his psychiatric state was discharged to follow-up as an outpatient after initial, after meds provided.  Final Clinical Impressions(s) / ED Diagnoses   Final diagnoses:  Auditory hallucinations     Carmin Muskrat, MD 10/26/19 [phone]

## 2019-10-26 NOTE — Discharge Instructions (Signed)
Please be sure to follow-up with your physician.  Monitor your condition carefully, and do not hesitate to return here if you develop new, or concerning changes.

## 2019-10-27 ENCOUNTER — Encounter (HOSPITAL_COMMUNITY): Payer: Self-pay | Admitting: Emergency Medicine

## 2019-10-27 ENCOUNTER — Emergency Department (HOSPITAL_COMMUNITY)
Admission: EM | Admit: 2019-10-27 | Discharge: 2019-10-27 | Disposition: A | Discharge status: Home / Self Care | Payer: Self-pay | Attending: Emergency Medicine | Admitting: Emergency Medicine

## 2019-10-27 ENCOUNTER — Other Ambulatory Visit: Payer: Self-pay

## 2019-10-27 DIAGNOSIS — F17210 Nicotine dependence, cigarettes, uncomplicated: Secondary | ICD-10-CM | POA: Insufficient documentation

## 2019-10-27 DIAGNOSIS — R440 Auditory hallucinations: Secondary | ICD-10-CM | POA: Insufficient documentation

## 2019-10-27 DIAGNOSIS — Z79899 Other long term (current) drug therapy: Secondary | ICD-10-CM | POA: Insufficient documentation

## 2019-10-27 DIAGNOSIS — Z046 Encounter for general psychiatric examination, requested by authority: Secondary | ICD-10-CM | POA: Insufficient documentation

## 2019-10-27 DIAGNOSIS — F329 Major depressive disorder, single episode, unspecified: Secondary | ICD-10-CM | POA: Insufficient documentation

## 2019-10-27 LAB — SALICYLATE LEVEL: Salicylate Lvl: <7 mg/dL (ref 2.8–30.0)

## 2019-10-27 LAB — CBC
HCT: 41.8 % (ref 39.0–52.0)
Hemoglobin: 13.1 g/dL (ref 13.0–17.0)
MCH: 28.2 pg (ref 26.0–34.0)
MCHC: 31.3 g/dL (ref 30.0–36.0)
MCV: 90.1 fL (ref 80.0–100.0)
Platelets: 308 10*3/uL (ref 150–400)
RBC: 4.64 MIL/uL (ref 4.22–5.81)
RDW: 12.8 % (ref 11.5–15.5)
WBC: 4.7 10*3/uL (ref 4.0–10.5)
nRBC: 0 % (ref 0.0–0.2)

## 2019-10-27 LAB — COMPREHENSIVE METABOLIC PANEL
ALT: 46 U/L — ABNORMAL HIGH (ref 0–44)
AST: 27 U/L (ref 15–41)
Albumin: 3.9 g/dL (ref 3.5–5.0)
Alkaline Phosphatase: 44 U/L (ref 38–126)
Anion gap: 14 (ref 5–15)
BUN: 11 mg/dL (ref 6–20)
CO2: 21 mmol/L — ABNORMAL LOW (ref 22–32)
Calcium: 8.9 mg/dL (ref 8.9–10.3)
Chloride: 104 mmol/L (ref 98–111)
Creatinine, Ser: 0.94 mg/dL (ref 0.61–1.24)
GFR calc Af Amer: >60 mL/min (ref >60)
GFR calc non Af Amer: >60 mL/min (ref >60)
Glucose, Bld: 114 mg/dL — ABNORMAL HIGH (ref 70–99)
Potassium: 3.6 mmol/L (ref 3.5–5.1)
Sodium: 139 mmol/L (ref 135–145)
Total Bilirubin: 0.4 mg/dL (ref 0.3–1.2)
Total Protein: 7.3 g/dL (ref 6.5–8.1)

## 2019-10-27 LAB — ACETAMINOPHEN LEVEL: Acetaminophen (Tylenol), Serum: <10 ug/mL — ABNORMAL LOW (ref 10–30)

## 2019-10-27 LAB — ETHANOL: Alcohol, Ethyl (B): 56 mg/dL — ABNORMAL HIGH (ref <10)

## 2019-10-27 NOTE — ED Triage Notes (Signed)
Patient is complaining of depression and hallucinations. Patient denis SI and HI.

## 2019-10-27 NOTE — BH Assessment (Signed)
Tele Assessment Note   Patient Name: Cole Barrett MRN: [id number] Referring Physician: Lorre NickAnthony Allen, MD Location of Patient: [facility], [phone] Location of Provider: Behavioral Health TTS Department  Cole Barrett is an 33 y.o. single male who presents unaccompanied to [facility] ED reporting he is hearing voices telling him they are going to kill him. Pt has a history of schizophrenia and has presented to area EDs 25 times in the past six month reporting the similar symptoms. Pt says the voices have not increased or decreased in intensity or frequency. He denies visual hallucinations. He describes his mood as "I don't have a mood." He denies depressive symptoms. He denies problems with sleep or appetite. He denies current suicidal ideation or history of suicide attempts. Pt denies any history of intentional self-injurious behaviors. Pt denies current homicidal ideation or history of violence. Pt denies any history of auditory or visual hallucinations. Pt denies alcohol or other substance use, however Pt has a history of using alcohol and marijuana. He denies other substance use.   Pt identifies his mental health symptoms as his primary stressor. Per medical record, he is unemployed and lives alone. He he has two brother who are his primary support. He denies history of abuse or trauma. He denies legal problems. He denies access to firearms.   Pt states he does not have current outpatient mental health providers. He has been outpatient with [facility] in the past. He says he is not currently taking any psychiatric medications. He says it has been at least two years since he was psychiatrically hospitalized. Pt's medical record indicates he [facility] January 2019 and in the observation unit in October 2020.   Pt is dressed in hospital scrubs, very drowsy and oriented x4. Pt speaks in a clear tone, at moderate volume and normal pace. Pt's mood is euthymic  and affect is congruent with mood. Thought process is coherent and relevant. Pt was pleasant and cooperative throughout assessment. He says he wants to be observed in the ED for a couple of days or admitted to a psychiatric facility..   Diagnosis: F20.9 Schizophrenia  Past Medical History:  Past Medical History:  Diagnosis Date  . Homelessness   . Hypercholesterolemia   . Mental disorder   . Paranoid behavior (HCC)   . PE (pulmonary embolism)   . Schizophrenia (HCC)     History reviewed. No pertinent surgical history.  Family History:  Family History  Problem Relation Age of Onset  . Hypertension Mother     Social History:  reports that he has been smoking cigarettes. He has been smoking about 0.50 packs per day. He has never used smokeless tobacco. He reports current alcohol use. He reports current drug use. Drug: Marijuana.  Additional Social History:  Alcohol / Drug Use Pain Medications: Denies use Prescriptions: Denies use Over the Counter: Denies use History of alcohol / drug use?: No history of alcohol / drug abuse Longest period of sobriety (when/how long): NA  CIWA: CIWA-Ar BP: (!) 141/76 Pulse Rate: (!) 109 COWS:    Allergies:  Allergies  Allergen Reactions  . Haldol [Haloperidol] Shortness Of Breath    Home Medications: (Not in a hospital admission)   OB/GYN Status:  No LMP for male patient.  General Assessment Data Location of Assessment: [facility] ED TTS Assessment: In system Is this a Tele or Face-to-Face Assessment?: Tele Assessment Is this an Initial Assessment or a Re-assessment for this encounter?: Initial Assessment  Patient Accompanied by:: N/A Language Other than English: No Living Arrangements: Other (Comment)(Lives alone) What gender do you identify as?: Male Marital status: Single Maiden name: NA Pregnancy Status: No Living Arrangements: Alone Can pt return to current living arrangement?: Yes Admission Status: Voluntary Is patient capable  of signing voluntary admission?: Yes Referral Source: Self/Family/Friend Insurance type: Self-pay     Crisis Care Plan Living Arrangements: Alone Legal Guardian: Other:(Self) Name of Psychiatrist: None Name of Therapist: None  Education Status Is patient currently in school?: No Is the patient employed, unemployed or receiving disability?: Unemployed  Risk to self with the past 6 months Suicidal Ideation: No Has patient been a risk to self within the past 6 months prior to admission? : No Suicidal Intent: No Has patient had any suicidal intent within the past 6 months prior to admission? : No Is patient at risk for suicide?: No Suicidal Plan?: No Has patient had any suicidal plan within the past 6 months prior to admission? : No Access to Means: No What has been your use of drugs/alcohol within the last 12 months?: Pt denies Previous Attempts/Gestures: No How many times?: 0 Other Self Harm Risks: None Triggers for Past Attempts: None known Intentional Self Injurious Behavior: None Family Suicide History: No Recent stressful life event(s): Other (Comment)(Pt cannot identify any stressors) Persecutory voices/beliefs?: Yes Depression: No Depression Symptoms: (Pt denies) Substance abuse history and/or treatment for substance abuse?: No Suicide prevention information given to non-admitted patients: Not applicable  Risk to Others within the past 6 months Homicidal Ideation: No Does patient have any lifetime risk of violence toward others beyond the six months prior to admission? : No Thoughts of Harm to Others: No Current Homicidal Intent: No Current Homicidal Plan: No Access to Homicidal Means: No Identified Victim: None History of harm to others?: No Assessment of Violence: None Noted Violent Behavior Description: NA Does patient have access to weapons?: No Criminal Charges Pending?: No Describe Pending Criminal Charges: None Does patient have a court date: No Is  patient on probation?: No  Psychosis Hallucinations: Auditory Delusions: None noted  Mental Status Report Appearance/Hygiene: In scrubs Eye Contact: Poor Motor Activity: Freedom of movement Speech: Logical/coherent Level of Consciousness: Drowsy Mood: Euthymic, Pleasant Affect: Appropriate to circumstance Anxiety Level: None Thought Processes: Coherent, Relevant Judgement: Partial Orientation: Person, Place, Time, Situation Obsessive Compulsive Thoughts/Behaviors: None  Cognitive Functioning Concentration: Decreased Memory: Recent Intact, Remote Intact Is patient IDD: No Insight: Fair Impulse Control: Fair Appetite: Good Have you had any weight changes? : No Change Sleep: No Change Total Hours of Sleep: 8 Vegetative Symptoms: None  ADLScreening [facility] Assessment Services) Patient's cognitive ability adequate to safely complete daily activities?: Yes Patient able to express need for assistance with ADLs?: Yes Independently performs ADLs?: Yes (appropriate for developmental age)  Prior Inpatient Therapy Prior Inpatient Therapy: Yes Prior Therapy Dates: 2018, 2019 Prior Therapy Facilty/Provider(s): [facility] Reason for Treatment: AH  Prior Outpatient Therapy Prior Outpatient Therapy: Yes Prior Therapy Dates: 2020 Prior Therapy Facilty/Provider(s): [facility] Reason for Treatment: Med mang Does patient have an ACCT team?: No Does patient have Intensive In-House Services?  : No Does patient have [facility] services? : No Does patient have P4CC services?: No  ADL Screening (condition at time of admission) Patient's cognitive ability adequate to safely complete daily activities?: Yes Is the patient deaf or have difficulty hearing?: No Does the patient have difficulty seeing, even when wearing glasses/contacts?: No Does the patient have difficulty concentrating, remembering, or making decisions?: No Patient able  to express need for assistance with ADLs?: Yes Does the patient have  difficulty dressing or bathing?: No Independently performs ADLs?: Yes (appropriate for developmental age) Does the patient have difficulty walking or climbing stairs?: No Weakness of Legs: None Weakness of Arms/Hands: None  Home Assistive Devices/Equipment Home Assistive Devices/Equipment: None    Abuse/Neglect Assessment (Assessment to be complete while patient is alone) Abuse/Neglect Assessment Can Be Completed: Yes Physical Abuse: Denies Verbal Abuse: Denies Sexual Abuse: Denies Exploitation of patient/patient's resources: Denies Self-Neglect: Denies     Regulatory affairs officer (For Healthcare) Does Patient Have a Medical Advance Directive?: Unable to assess, patient is non-responsive or altered mental status Would patient like information on creating a medical advance directive?: No - Patient declined          Disposition: Gave clinical report to Lindon Romp, FNP who said Pt does not meet criteria for inpatient psychiatric treatment. Recommendation is for Pt to be discharged and follow up with [facility]. Lindon Romp, FNP said he would discuss recommendation with EDP.  Disposition Initial Assessment Completed for this Encounter: Yes Patient referred to: [facility]  This service was provided via telemedicine using a 2-way, interactive audio and video technology.  Names of all persons participating in this telemedicine service and their role in this encounter. Name: Scot Jun Role: Patient  Name: Storm Frisk, [facility] Role: TTS counselor         Orpah Greek Anson Fret, [facility] Triage Specialist [phone]  Evelena Peat 10/27/2019 2:50 AM

## 2019-10-27 NOTE — ED Provider Notes (Signed)
[facility]-EMERGENCY DEPT Provider Note   CSN: [id number] Arrival date & time: 10/27/19  0110     History   Chief Complaint Chief Complaint  Patient presents with  . Depression  . Medical Clearance    HPI Cole Barrett is a 33 y.o. male.     Patient to ED with depression and "voices in my head" with history of same. No HI/SI. He denies symptoms of illness. Patient has history of schizophrenia, alcohol abuse, auditory hallucinations, homelessness, PE. He is followed at [facility] for psychiatric issues. No SOB, CP.   The history is provided by the patient. No language interpreter was used.    Past Medical History:  Diagnosis Date  . Homelessness   . Hypercholesterolemia   . Mental disorder   . Paranoid behavior (HCC)   . PE (pulmonary embolism)   . Schizophrenia [facility])     Patient Active Problem List   Diagnosis Date Noted  . Acute psychosis (HCC) 08/31/2019  . Schizophrenia (HCC) 08/31/2019  . Schizoaffective disorder, bipolar type (HCC) 01/19/2019  . Alcohol abuse   . Auditory hallucinations   . Alcohol abuse with alcohol-induced mood disorder (HCC) 09/08/2016  . Homelessness 09/03/2016  . Person feigning illness 09/03/2016  . 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 Cannabis use disorder, mild, abuse 08/29/2016  . Pulmonary emboli (HCC) 07/13/2016  . Adjustment disorder with emotional disturbance 03/12/2014  . 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 Acute left flank pain 01/02/2014  . Pulmonary embolism, bilateral (HCC) 01/02/2014    History reviewed. No pertinent surgical history.      Home Medications    Prior to Admission medications   Medication Sig Start Date End Date Taking? Authorizing Provider  amantadine (SYMMETREL) 100 MG capsule Take 1 capsule (100 mg total) by mouth 2 (two) times daily. 09/01/19   [name], NP  risperiDONE (RISPERDAL) 1 MG tablet Take 1 tablet by mouth in the morning  and take 3 tablets at bedtime daily. 10/23/19   [name], PA-C    Family History Family History  Problem Relation Age of Onset  . Hypertension Mother     Social History Social History   Tobacco Use  . Smoking status: Current Every Day Smoker    Packs/day: 0.50    Types: Cigarettes  . Smokeless tobacco: Never Used  Substance Use Topics  . Alcohol use: Yes    Comment: BAC not available  . Drug use: Yes    Types: Marijuana    Comment: Last used: 2 months ago UDS NA     Allergies   Haldol [haloperidol]   Review of Systems Review of Systems  Constitutional: Negative for chills and fever.  HENT: Negative.   Respiratory: Negative.   Cardiovascular: Negative.   Gastrointestinal: Negative.   Musculoskeletal: Negative.   Skin: Negative.   Neurological: Negative.   Psychiatric/Behavioral: Positive for hallucinations.     Physical Exam Updated Vital Signs BP (!) 141/76 (BP Location: Right Arm)   Pulse (!) 109   Temp 98.2 F (36.8 C) (Oral)   Resp 18   Ht 5\' 10"  (1.778 m)   Wt 104.3 kg   SpO2 97%   BMI 33.00 kg/m   Physical Exam Vitals signs and nursing note reviewed.  Constitutional:      Appearance: He is well-developed.  HENT:     Head: Normocephalic.  Neck:     Musculoskeletal: Normal range of motion and neck supple.  Cardiovascular:  Rate and Rhythm: Normal rate and regular rhythm.  Pulmonary:     Effort: Pulmonary effort is normal.     Breath sounds: Normal breath sounds. No wheezing, rhonchi or rales.  Abdominal:     General: Bowel sounds are normal.     Palpations: Abdomen is soft.     Tenderness: There is no abdominal tenderness. There is no guarding or rebound.  Musculoskeletal: Normal range of motion.  Skin:    General: Skin is warm and dry.     Findings: No rash.  Neurological:     Mental Status: He is alert and oriented to person, place, and time.  Psychiatric:        Attention and Perception: He perceives auditory  hallucinations.        Mood and Affect: Mood normal.        Speech: Speech normal.        Behavior: Behavior is cooperative.        Thought Content: Thought content does not include homicidal or suicidal ideation.      ED Treatments / Results  Labs (all labs ordered are listed, but only abnormal results are displayed) Labs Reviewed  COMPREHENSIVE METABOLIC PANEL - Abnormal; Notable for the following components:      Result Value   CO2 21 (*)    Glucose, Bld 114 (*)    ALT 46 (*)    All other components within normal limits  ETHANOL - Abnormal; Notable for the following components:   Alcohol, Ethyl (B) 56 (*)    All other components within normal limits  ACETAMINOPHEN LEVEL - Abnormal; Notable for the following components:   Acetaminophen (Tylenol), Serum <10 (*)    All other components within normal limits  SALICYLATE LEVEL  CBC  RAPID URINE DRUG SCREEN, HOSP PERFORMED    EKG None  Radiology No results found.  Procedures Procedures (including critical care time)  Medications Ordered in ED Medications - No data to display   Initial Impression / Assessment and Plan / ED Course  I have reviewed the triage vital signs and the nursing notes.  Pertinent labs & imaging results that were available during my care of the patient were reviewed by me and considered in my medical decision making (see chart for details).        Patient well known to the ED with 19 visits in 6 months, the last visit yesterday morning, returning to the ED within 12 hours of discharge with complaint of auditory hallucinations. A full psychiatric work up was completed.   He is considered medically cleared for TTS consultation.  TTS consultation performed and patient is felt to be at baseline and stable for discharge with f/u at [facility]. This was explained to the patient who is comfortable with discharge.   Final Clinical Impressions(s) / ED Diagnoses   Final diagnoses:  None   1. Auditory  hallucinations  ED Discharge Orders    None       Charlann Lange, PA-C 10/27/19 0316    Molpus, Jenny Reichmann, MD 10/27/19 [phone]

## 2019-10-27 NOTE — Discharge Instructions (Signed)
Please follow up at [facility] for ongoing treatment.

## 2019-10-27 NOTE — Progress Notes (Addendum)
Patient ID: Cole Barrett, male   DOB: 01-31-1986, 33 y.o.   MRN: [id number]  Patient has been evaluated by TTS. Cole Barrett is a 33 y.o. male who is well known to behavioral health  services. Patient presents frequently with reports of auditory hallucination that that state "they are going to kill me." Patient consistently denies suicidal thoughts and homicidal thoughts. Per chart review patient was provided with a 2 week supply of Risperdal on 10/23/2019.   Patient does not meet inpatient criteria. Recommend follow up with [facility].  Discussed case with Dr. Florina Ou.

## 2019-11-02 ENCOUNTER — Emergency Department (HOSPITAL_COMMUNITY)
Admission: EM | Admit: 2019-11-02 | Discharge: 2019-11-02 | Disposition: A | Discharge status: Home / Self Care | Payer: Self-pay | Attending: Emergency Medicine | Admitting: Emergency Medicine

## 2019-11-02 ENCOUNTER — Encounter (HOSPITAL_COMMUNITY): Payer: Self-pay | Admitting: *Deleted

## 2019-11-02 ENCOUNTER — Other Ambulatory Visit: Payer: Self-pay

## 2019-11-02 DIAGNOSIS — Z590 Homelessness: Secondary | ICD-10-CM | POA: Insufficient documentation

## 2019-11-02 DIAGNOSIS — Z79899 Other long term (current) drug therapy: Secondary | ICD-10-CM | POA: Insufficient documentation

## 2019-11-02 DIAGNOSIS — F17210 Nicotine dependence, cigarettes, uncomplicated: Secondary | ICD-10-CM | POA: Insufficient documentation

## 2019-11-02 DIAGNOSIS — R440 Auditory hallucinations: Secondary | ICD-10-CM | POA: Insufficient documentation

## 2019-11-02 MED ORDER — RISPERIDONE 2 MG PO TABS
2.0000 mg | ORAL_TABLET | Freq: Once | ORAL | Status: AC
  Administered 2019-11-02: 2 mg via ORAL
  Filled 2019-11-02: qty 1

## 2019-11-02 NOTE — ED Provider Notes (Signed)
[facility]-EMERGENCY DEPT Provider Note   CSN: [id number] Arrival date & time: 11/02/19  1846     History   Chief Complaint Chief Complaint  Patient presents with  . Hallucinations    HPI Cole Barrett is a 33 y.o. male.     HPI 33 year old with history of schizophrenia comes in a chief complaint of hallucinations.  Patient reports that he started having sudden hallucinations this afternoon.  He has been taking his medications as prescribed.  He denies any drug use, new stress and reports that he is resting well. He has no HI, SI.  Past Medical History:  Diagnosis Date  . Homelessness   . Hypercholesterolemia   . Mental disorder   . Paranoid behavior (HCC)   . PE (pulmonary embolism)   . Schizophrenia [facility])     Patient Active Problem List   Diagnosis Date Noted  . Acute psychosis (HCC) 08/31/2019  . Schizophrenia (HCC) 08/31/2019  . Schizoaffective disorder, bipolar type (HCC) 01/19/2019  . Alcohol abuse   . Auditory hallucinations   . Alcohol abuse with alcohol-induced mood disorder (HCC) 09/08/2016  . Homelessness 09/03/2016  . Person feigning illness 09/03/2016  . 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 Cannabis use disorder, mild, abuse 08/29/2016  . Pulmonary emboli (HCC) 07/13/2016  . Adjustment disorder with emotional disturbance 03/12/2014  . 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 Acute left flank pain 01/02/2014  . Pulmonary embolism, bilateral (HCC) 01/02/2014    History reviewed. No pertinent surgical history.      Home Medications    Prior to Admission medications   Medication Sig Start Date End Date Taking? Authorizing Provider  amantadine (SYMMETREL) 100 MG capsule Take 1 capsule (100 mg total) by mouth 2 (two) times daily. 09/01/19   [name], NP  risperiDONE (RISPERDAL) 1 MG tablet Take 1 tablet by mouth in the morning and take 3 tablets at bedtime daily. 10/23/19   [name],  PA-C    Family History Family History  Problem Relation Age of Onset  . Hypertension Mother     Social History Social History   Tobacco Use  . Smoking status: Current Every Day Smoker    Packs/day: 0.50    Types: Cigarettes  . Smokeless tobacco: Never Used  Substance Use Topics  . Alcohol use: Yes    Comment: BAC not available  . Drug use: Yes    Types: Marijuana    Comment: Last used: 2 months ago UDS NA     Allergies   Haldol [haloperidol]   Review of Systems Review of Systems  Constitutional: Negative for activity change.  Respiratory: Negative for shortness of breath.   Cardiovascular: Negative for chest pain.  Psychiatric/Behavioral: Positive for hallucinations. Negative for self-injury and suicidal ideas. The patient is not nervous/anxious.      Physical Exam Updated Vital Signs BP (!) 165/84 (BP Location: Left Arm)   Pulse 96   Temp 98 F (36.7 C) (Oral)   Resp 18   SpO2 97%   Physical Exam Vitals signs and nursing note reviewed.  Constitutional:      Appearance: He is well-developed.  HENT:     Head: Atraumatic.  Neck:     Musculoskeletal: Neck supple.  Cardiovascular:     Rate and Rhythm: Normal rate.  Pulmonary:     Effort: Pulmonary effort is normal.  Skin:    General: Skin is warm.  Neurological:     Mental Status: He is  alert and oriented to person, place, and time.  Psychiatric:        Mood and Affect: Mood normal.        Behavior: Behavior normal.        Judgment: Judgment normal.      ED Treatments / Results  Labs (all labs ordered are listed, but only abnormal results are displayed) Labs Reviewed - No data to display  EKG None  Radiology No results found.  Procedures Procedures (including critical care time)  Medications Ordered in ED Medications  risperiDONE (RISPERDAL) tablet 2 mg (2 mg Oral Given 11/02/19 2043)     Initial Impression / Assessment and Plan / ED Course  I have reviewed the triage vital signs  and the nursing notes.  Pertinent labs & imaging results that were available during my care of the patient were reviewed by me and considered in my medical decision making (see chart for details).        Patient comes in a chief complaint of auditory hallucinations. He stable, he has no SI, HI and does not appear to be decompensated at this time.  We are able to have a conversation and patient does not need any redirection.  Since he psychiatrically stable we will discharge him.  He will get risperidone in the ED and he has been advised to follow-up with [facility].  Patient has been seen by ED and behavioral health on 1125 and 1128 with the same disposition. Patient encouraged to follow-up with [facility] -and he agrees.  He would like to get some Risperdal whilst in the ED and get some rest.  We will oblige and discharge him by 9 PM.  Final Clinical Impressions(s) / ED Diagnoses   Final diagnoses:  Auditory hallucination    ED Discharge Orders    None       Varney Biles, MD 11/02/19 2052

## 2019-11-02 NOTE — ED Triage Notes (Signed)
Pt states he got off work today at [employer] and "voices in my head got bad". Pt states voices said they were going to kill him. Pt has hx of hearing voices. Pt takes Risperdal. Pt states he wants to go to [facility], which he states helped him in the past. Pt denies SI/HI or visual hallucinations.

## 2019-11-03 ENCOUNTER — Emergency Department (HOSPITAL_COMMUNITY)
Admission: EM | Admit: 2019-11-03 | Discharge: 2019-11-04 | Disposition: A | Discharge status: Home / Self Care | Payer: Self-pay | Attending: Emergency Medicine | Admitting: Emergency Medicine

## 2019-11-03 ENCOUNTER — Other Ambulatory Visit: Payer: Self-pay

## 2019-11-03 ENCOUNTER — Encounter (HOSPITAL_COMMUNITY): Payer: Self-pay | Admitting: Emergency Medicine

## 2019-11-03 DIAGNOSIS — F17210 Nicotine dependence, cigarettes, uncomplicated: Secondary | ICD-10-CM | POA: Insufficient documentation

## 2019-11-03 DIAGNOSIS — Z79899 Other long term (current) drug therapy: Secondary | ICD-10-CM | POA: Insufficient documentation

## 2019-11-03 DIAGNOSIS — F209 Schizophrenia, unspecified: Secondary | ICD-10-CM | POA: Insufficient documentation

## 2019-11-03 DIAGNOSIS — R440 Auditory hallucinations: Secondary | ICD-10-CM

## 2019-11-03 LAB — COMPREHENSIVE METABOLIC PANEL
ALT: 46 U/L — ABNORMAL HIGH (ref 0–44)
AST: 33 U/L (ref 15–41)
Albumin: 3.6 g/dL (ref 3.5–5.0)
Alkaline Phosphatase: 41 U/L (ref 38–126)
Anion gap: 11 (ref 5–15)
BUN: 9 mg/dL (ref 6–20)
CO2: 21 mmol/L — ABNORMAL LOW (ref 22–32)
Calcium: 8.7 mg/dL — ABNORMAL LOW (ref 8.9–10.3)
Chloride: 105 mmol/L (ref 98–111)
Creatinine, Ser: 1.31 mg/dL — ABNORMAL HIGH (ref 0.61–1.24)
GFR calc Af Amer: >60 mL/min (ref >60)
GFR calc non Af Amer: >60 mL/min (ref >60)
Glucose, Bld: 98 mg/dL (ref 70–99)
Potassium: 3.9 mmol/L (ref 3.5–5.1)
Sodium: 137 mmol/L (ref 135–145)
Total Bilirubin: 0.5 mg/dL (ref 0.3–1.2)
Total Protein: 6.7 g/dL (ref 6.5–8.1)

## 2019-11-03 LAB — CBC
HCT: 38.2 % — ABNORMAL LOW (ref 39.0–52.0)
Hemoglobin: 12.4 g/dL — ABNORMAL LOW (ref 13.0–17.0)
MCH: 28.3 pg (ref 26.0–34.0)
MCHC: 32.5 g/dL (ref 30.0–36.0)
MCV: 87.2 fL (ref 80.0–100.0)
Platelets: 285 10*3/uL (ref 150–400)
RBC: 4.38 MIL/uL (ref 4.22–5.81)
RDW: 12.9 % (ref 11.5–15.5)
WBC: 3.9 10*3/uL — ABNORMAL LOW (ref 4.0–10.5)
nRBC: 0 % (ref 0.0–0.2)

## 2019-11-03 LAB — ETHANOL: Alcohol, Ethyl (B): <10 mg/dL (ref <10)

## 2019-11-03 NOTE — ED Triage Notes (Signed)
Pt reports auditory hallucinations became worse tonight after leaving work, pt reports taking Risperdal as directed. Pt states voices are saying they are going to kill him, denies SI/HI

## 2019-11-04 LAB — RAPID URINE DRUG SCREEN, HOSP PERFORMED
Amphetamines: NOT DETECTED
Barbiturates: NOT DETECTED
Benzodiazepines: NOT DETECTED
Cocaine: NOT DETECTED
Opiates: NOT DETECTED
Tetrahydrocannabinol: NOT DETECTED

## 2019-11-04 MED ORDER — RISPERIDONE 3 MG PO TABS
3.0000 mg | ORAL_TABLET | Freq: Once | ORAL | Status: AC
  Administered 2019-11-04: 3 mg via ORAL
  Filled 2019-11-04 (×2): qty 1

## 2019-11-04 NOTE — BH Assessment (Signed)
Tele Assessment Note   Patient Name: Jesiah Grismer MRN: [id number] Referring Physician: Barkley Boards PA-C Location of Patient: [facility] Location of Provider: Behavioral Health TTS Department  Kaydon Husby is an 33 y.o. male who presents to the ED voluntarily. Pt reports he has been experiencing worsening AH that tell him they are going to kill him. Pt states he was taking his medication and doing better "for a while" but states when he got off of work this evening, "the voices got bad." Pt states he wants to be admitted to [facility]. Pt states he lives alone in a house and works for [name]. Pt has multiple ED visits in the past 6 months, 21 visits, c/o similar concerns. Pt was recently assessed by TTS on 10/27/19 with a similar presentation. Pt states he does not have any family or supports for TTS to contact in order to obtain collateral information. Pt denies SI, HI, and VH. Pt denies other complaints or stressors at this time.  [name], NP states pt does not meet criteria for inpt tx. Recommends pt f/u with [facility] for ongoing MH needs. Pt's nurse Marga Hoots, April N, RN and EDP McDonald, Mia A, PA-C have been advised.  Diagnosis: Schizophrenia  Past Medical History:  Past Medical History:  Diagnosis Date  . Homelessness   . Hypercholesterolemia   . Mental disorder   . Paranoid behavior (HCC)   . PE (pulmonary embolism)   . Schizophrenia (HCC)     History reviewed. No pertinent surgical history.  Family History:  Family History  Problem Relation Age of Onset  . Hypertension Mother     Social History:  reports that he has been smoking cigarettes. He has been smoking about 0.50 packs per day. He has never used smokeless tobacco. He reports current alcohol use. He reports current drug use. Drug: Marijuana.  Additional Social History:  Alcohol / Drug Use Pain Medications: See MAR Prescriptions: See MAR Over the Counter: See MAR History of alcohol / drug  use?: No history of alcohol / drug abuse  CIWA: CIWA-Ar BP: 135/81 Pulse Rate: 76 COWS:    Allergies:  Allergies  Allergen Reactions  . Haldol [Haloperidol] Shortness Of Breath    Home Medications: (Not in a hospital admission)   OB/GYN Status:  No LMP for male patient.  General Assessment Data Location of Assessment: [facility] ED TTS Assessment: In system Is this a Tele or Face-to-Face Assessment?: Tele Assessment Is this an Initial Assessment or a Re-assessment for this encounter?: Initial Assessment                          ADLScreening [facility] Assessment Services) Patient's cognitive ability adequate to safely complete daily activities?: Yes Patient able to express need for assistance with ADLs?: Yes Independently performs ADLs?: Yes (appropriate for developmental age)        ADL Screening (condition at time of admission) Patient's cognitive ability adequate to safely complete daily activities?: Yes Is the patient deaf or have difficulty hearing?: No Does the patient have difficulty seeing, even when wearing glasses/contacts?: No Does the patient have difficulty concentrating, remembering, or making decisions?: No Patient able to express need for assistance with ADLs?: Yes Does the patient have difficulty dressing or bathing?: No Independently performs ADLs?: Yes (appropriate for developmental age) Does the patient have difficulty walking or climbing stairs?: No Weakness of Legs: None Weakness of Arms/Hands: None  Home Assistive Devices/Equipment Home Assistive Devices/Equipment: None  Abuse/Neglect Assessment (Assessment to be complete while patient is alone) Abuse/Neglect Assessment Can Be Completed: Yes Physical Abuse: Denies Verbal Abuse: Denies Sexual Abuse: Denies Exploitation of patient/patient's resources: Denies Self-Neglect: Denies     Advance Directives (For Healthcare) Does Patient Have a Medical Advance Directive?: No Would patient  like information on creating a medical advance directive?: No - Patient declined          Disposition: Lindon Romp, NP states pt does not meet criteria for inpt tx. Recommends pt f/u with [facility] for ongoing MH needs. Pt's nurse Ermalinda Barrios, April N, RN and EDP McDonald, Mia A, PA-C have been advised.    This service was provided via telemedicine using a 2-way, interactive audio and video technology.  Names of all persons participating in this telemedicine service and their role in this encounter. Name: Faruq Rosenberger Role: Patient  Name: Lind Covert Role: TTS          Lyanne Co 11/04/2019 6:07 AM

## 2019-11-04 NOTE — Discharge Instructions (Addendum)
Thank you for allowing me to care for you today in the Emergency Department.   Make sure that you are drinking plenty of fluids by mouth.  You have been medically cleared by psychiatry today.  Please follow-up with [facility].  Return to the emergency department if you develop new or worsening symptoms.

## 2019-11-04 NOTE — ED Provider Notes (Signed)
[name] EMERGENCY DEPARTMENT Provider Note   CSN: [id number] Arrival date & time: 11/03/19  2228     History   Chief Complaint Chief Complaint  Patient presents with  . Hallucinations    HPI Cole Barrett is a 33 y.o. male with a history of schizophrenia, paranoid behavior, and chronic hallucinations who presents to the emergency department with a chief complaint of worsening auditory hallucinations.  Tonight, he reports that he has been hearing voices that have told him to kill himself.  He reports that he has been compliant with his home Risperdal, but has not taken his nighttime dose.  States he just wants to lay here quietly to try to drown out the voices.  He denies HI or visual hallucinations.    The history is provided by the patient. No language interpreter was used.    Past Medical History:  Diagnosis Date  . Homelessness   . Hypercholesterolemia   . Mental disorder   . Paranoid behavior (HCC)   . PE (pulmonary embolism)   . Schizophrenia [facility])     Patient Active Problem List   Diagnosis Date Noted  . Acute psychosis (HCC) 08/31/2019  . Schizophrenia (HCC) 08/31/2019  . Schizoaffective disorder, bipolar type (HCC) 01/19/2019  . Alcohol abuse   . Auditory hallucinations   . Alcohol abuse with alcohol-induced mood disorder (HCC) 09/08/2016  . Homelessness 09/03/2016  . Person feigning illness 09/03/2016  . 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 Cannabis use disorder, mild, abuse 08/29/2016  . Pulmonary emboli (HCC) 07/13/2016  . Adjustment disorder with emotional disturbance 03/12/2014  . 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 Acute left flank pain 01/02/2014  . Pulmonary embolism, bilateral (HCC) 01/02/2014    History reviewed. No pertinent surgical history.      Home Medications    Prior to Admission medications   Medication Sig Start Date End Date Taking? Authorizing Provider  risperiDONE  (RISPERDAL) 1 MG tablet Take 1 tablet by mouth in the morning and take 3 tablets at bedtime daily. Patient taking differently: Take 1-3 mg by mouth See admin instructions. Take 1 mg by mouth in the morning and 3 mg at bedtime 10/23/19  Yes [name], PA-C  amantadine (SYMMETREL) 100 MG capsule Take 1 capsule (100 mg total) by mouth 2 (two) times daily. Patient not taking: Reported on 11/04/2019 09/01/19   [name], NP    Family History Family History  Problem Relation Age of Onset  . Hypertension Mother     Social History Social History   Tobacco Use  . Smoking status: Current Every Day Smoker    Packs/day: 0.50    Types: Cigarettes  . Smokeless tobacco: Never Used  Substance Use Topics  . Alcohol use: Yes    Comment: BAC not available  . Drug use: Yes    Types: Marijuana    Comment: Last used: 2 months ago UDS NA     Allergies   Haldol [haloperidol]   Review of Systems Review of Systems  Constitutional: Negative for appetite change and fever.  Respiratory: Negative for shortness of breath.   Cardiovascular: Negative for chest pain.  Gastrointestinal: Negative for abdominal pain.  Genitourinary: Negative for dysuria.  Musculoskeletal: Negative for back pain.  Skin: Negative for rash.  Allergic/Immunologic: Negative for immunocompromised state.  Neurological: Negative for headaches.  Psychiatric/Behavioral: Positive for hallucinations. Negative for agitation, confusion, self-injury and suicidal ideas.     Physical Exam Updated Vital Signs BP  119/65   Pulse 85   Temp 98.4 F (36.9 C) (Oral)   Resp 16   SpO2 100%   Physical Exam Vitals signs and nursing note reviewed.  Constitutional:      General: He is not in acute distress.    Appearance: He is well-developed. He is not ill-appearing or toxic-appearing.  HENT:     Head: Normocephalic.  Eyes:     Conjunctiva/sclera: Conjunctivae normal.  Neck:     Musculoskeletal: Neck supple.   Cardiovascular:     Rate and Rhythm: Normal rate and regular rhythm.     Heart sounds: No murmur.  Pulmonary:     Effort: Pulmonary effort is normal. No respiratory distress.     Breath sounds: No stridor. No wheezing, rhonchi or rales.  Chest:     Chest wall: No tenderness.  Abdominal:     General: There is no distension.     Palpations: Abdomen is soft.     Tenderness: There is no abdominal tenderness. There is no right CVA tenderness or guarding.  Skin:    General: Skin is warm and dry.  Neurological:     Mental Status: He is alert.  Psychiatric:        Attention and Perception: He perceives auditory hallucinations. He does not perceive visual hallucinations.        Mood and Affect: Mood normal.        Speech: Speech normal.        Behavior: Behavior normal. Behavior is cooperative.        Thought Content: Thought content is not paranoid or delusional. Thought content does not include homicidal or suicidal ideation. Thought content does not include homicidal or suicidal plan.      ED Treatments / Results  Labs (all labs ordered are listed, but only abnormal results are displayed) Labs Reviewed  COMPREHENSIVE METABOLIC PANEL - Abnormal; Notable for the following components:      Result Value   CO2 21 (*)    Creatinine, Ser 1.31 (*)    Calcium 8.7 (*)    ALT 46 (*)    All other components within normal limits  CBC - Abnormal; Notable for the following components:   WBC 3.9 (*)    Hemoglobin 12.4 (*)    HCT 38.2 (*)    All other components within normal limits  ETHANOL  RAPID URINE DRUG SCREEN, HOSP PERFORMED    EKG None  Radiology No results found.  Procedures Procedures (including critical care time)  Medications Ordered in ED Medications  risperiDONE (RISPERDAL) tablet 3 mg (3 mg Oral Given 11/04/19 0543)     Initial Impression / Assessment and Plan / ED Course  I have reviewed the triage vital signs and the nursing notes.  Pertinent labs & imaging  results that were available during my care of the patient were reviewed by me and considered in my medical decision making (see chart for details).        33 year old male who is well-known to the ER with 21 visits in the last 6 months.  He has a history of schizophrenia and has chronic auditory hallucinations.  This patient is well-known to me and I have evaluated him several times previously.  Typically, the patient comes to the ER for a dose of Risperdal; however, tonight he reports that he is concerned that the voices are now telling him to harm himself.  He denies HI or auditory hallucinations at this time.  On exam, he is  resting quietly laying in bed.  He does not appear to be responding to internal stimuli.  However, given worse than baseline hallucinations, will have the patient evaluated by TTS.   His creatinine is also slightly elevated from baseline, 1.3 today.  Reports that he has been eating and drinking well.  He has not been taking NSAIDs.  The patient was offered an IV fluid bolus, but declined.  He would like his nighttime dose of Risperdal.  Pt medically cleared at this time. Psych hold orders and home med orders placed. TTS consult pending; please see psych team notes for further documentation of care/dispo. Pt stable at time of med clearance.    Spoke with behavioral health.  The patient does not meet criteria for inpatient admission.  Recommend outpatient follow-up with [facility].  The patient has been advised of these recommendations.  All questions answered.  ER return precautions given.  On reevaluation, he is resting comfortably in no acute distress.  Safe for discharge to home with outpatient follow-up to [facility] as needed.  Final Clinical Impressions(s) / ED Diagnoses   Final diagnoses:  Auditory hallucinations    ED Discharge Orders    None       Joanne Gavel, PA-C 11/04/19 0617    Ward, Delice Bison, DO 11/04/19 9977

## 2019-11-04 NOTE — ED Notes (Signed)
Breakfast ordered 

## 2019-11-04 NOTE — Progress Notes (Signed)
Cole Romp, NP states pt does not meet criteria for inpt tx. Recommends pt f/u with [facility] for ongoing MH needs. Pt's nurse Ermalinda Barrios, April N, RN and EDP McDonald, Mia A, PA-C have been advised.  Lind Covert, MSW, LCSW Therapeutic Triage Specialist  [phone]

## 2019-11-08 ENCOUNTER — Other Ambulatory Visit: Payer: Self-pay

## 2019-11-08 ENCOUNTER — Encounter (HOSPITAL_COMMUNITY): Payer: Self-pay | Admitting: *Deleted

## 2019-11-08 ENCOUNTER — Emergency Department (HOSPITAL_COMMUNITY)
Admission: EM | Admit: 2019-11-08 | Discharge: 2019-11-08 | Disposition: A | Discharge status: Home / Self Care | Payer: Self-pay | Attending: Emergency Medicine | Admitting: Emergency Medicine

## 2019-11-08 DIAGNOSIS — Z760 Encounter for issue of repeat prescription: Secondary | ICD-10-CM | POA: Insufficient documentation

## 2019-11-08 DIAGNOSIS — F17210 Nicotine dependence, cigarettes, uncomplicated: Secondary | ICD-10-CM | POA: Insufficient documentation

## 2019-11-08 DIAGNOSIS — Z79899 Other long term (current) drug therapy: Secondary | ICD-10-CM | POA: Insufficient documentation

## 2019-11-08 MED ORDER — RISPERIDONE 1 MG PO TABS
1.0000 mg | ORAL_TABLET | Freq: Once | ORAL | Status: AC
  Administered 2019-11-08: 1 mg via ORAL
  Filled 2019-11-08: qty 1

## 2019-11-08 NOTE — ED Triage Notes (Signed)
Pt arrives ambulatory to triage stating that he is out of his Risperdal and needs a dose for tonight. Pt says that he just ran out and only missed 1 dose. Says that he is "going to [facility]" today to get his meds refilled. The pt is denying SI or HI.

## 2019-11-08 NOTE — ED Provider Notes (Signed)
[facility] EMERGENCY DEPARTMENT Provider Note   CSN: [id number] Arrival date & time: 11/08/19  9678     History Chief Complaint  Patient presents with  . Medication Refill    Cole Barrett is a 33 y.o. male with history of schizophrenia, passive/chronic hallucinations, homelessness presents to the ER for his "nighttime pills".  Patient specifically asking for Risperdal dose.  States he took his last Risperdal pill yesterday evening.  He gets his prescriptions refilled at [facility] and states he plans on going tomorrow.  He denies any acute changes in his mood, suicidal ideation, changes to his chronic hallucinations.  No other reported concerns today.  HPI     Past Medical History:  Diagnosis Date  . Homelessness   . Hypercholesterolemia   . Mental disorder   . Paranoid behavior ([place])   . PE (pulmonary embolism)   . Schizophrenia [facility])     Patient Active Problem List   Diagnosis Date Noted  . Acute psychosis ([place]) 08/31/2019  . Schizophrenia ([place]) 08/31/2019  . Schizoaffective disorder, bipolar type ([place]) 01/19/2019  . Alcohol abuse   . Auditory hallucinations   . Alcohol abuse with alcohol-induced mood disorder ([place]) 09/08/2016  . Homelessness 09/03/2016  . Person feigning illness 09/03/2016  . Brief psychotic disorder ([place]) 08/29/2016  . Cannabis use disorder, mild, abuse 08/29/2016  . Pulmonary emboli ([place]) 07/13/2016  . Adjustment disorder with emotional disturbance 03/12/2014  . Pulmonary embolism and infarction ([place]) 01/02/2014  . Acute left flank pain 01/02/2014  . Pulmonary embolism, bilateral ([place]) 01/02/2014    History reviewed. No pertinent surgical history.     Family History  Problem Relation Age of Onset  . Hypertension Mother     Social History   Tobacco Use  . Smoking status: Current Every Day Smoker    Packs/day: 0.50    Types: Cigarettes  . Smokeless tobacco: Never Used  Substance Use Topics  . Alcohol  use: Yes    Comment: BAC not available  . Drug use: Yes    Types: Marijuana    Comment: Last used: 2 months ago UDS NA    Home Medications Prior to Admission medications   Medication Sig Start Date End Date Taking? Authorizing Provider  amantadine (SYMMETREL) 100 MG capsule Take 1 capsule (100 mg total) by mouth 2 (two) times daily. Patient not taking: Reported on 11/04/2019 09/01/19   Mallie Darting, NP  risperiDONE (RISPERDAL) 1 MG tablet Take 1 tablet by mouth in the morning and take 3 tablets at bedtime daily. Patient taking differently: Take 1-3 mg by mouth See admin instructions. Take 1 mg by mouth in the morning and 3 mg at bedtime 10/23/19   Antonietta Breach, PA-C    Allergies    Haldol [haloperidol]  Review of Systems   Review of Systems  All other systems reviewed and are negative.   Physical Exam Updated Vital Signs BP 117/85 (BP Location: Left Arm)   Pulse 84   Temp 97.8 F (36.6 C) (Oral)   Resp 16   SpO2 97%   Physical Exam Constitutional:      Appearance: He is well-developed.  HENT:     Head: Normocephalic.     Nose: Nose normal.  Eyes:     General: Lids are normal.  Cardiovascular:     Rate and Rhythm: Normal rate.  Pulmonary:     Effort: Pulmonary effort is normal. No respiratory distress.  Musculoskeletal:        General:  Normal range of motion.     Cervical back: Normal range of motion.  Neurological:     Mental Status: He is alert.  Psychiatric:        Behavior: Behavior normal.     ED Results / Procedures / Treatments   Labs (all labs ordered are listed, but only abnormal results are displayed) Labs Reviewed - No data to display  EKG None  Radiology No results found.  Procedures Procedures (including critical care time)  Medications Ordered in ED Medications  risperiDONE (RISPERDAL) tablet 1 mg (1 mg Oral Given 11/08/19 0549)    ED Course  I have reviewed the triage vital signs and the nursing notes.  Pertinent labs &  imaging results that were available during my care of the patient were reviewed by me and considered in my medical decision making (see chart for details).    MDM Rules/Calculators/A&P       Risperidone dose given here.  Patient denies acute changes to mood to suggest acute psychiatric decompensation.  Behavior and demeanor is acceptable today, cooperative.  No indication for further emergent lab work, imaging or emergent psychiatric evaluation.  Patient plans on going to [facility] tomorrow for medication refills.  Return precautions given. Final Clinical Impression(s) / ED Diagnoses Final diagnoses:  Medication management    Rx / DC Orders ED Discharge Orders    None       Liberty Handy, PA-C 11/08/19 8416    [name], MD 11/08/19 [phone]

## 2019-11-08 NOTE — Discharge Instructions (Addendum)
Go to [facility] to refill your medicines.  Return to the ER for new or changes in hallucinations, thoughts of suicide or homicide

## 2019-11-11 ENCOUNTER — Encounter (HOSPITAL_COMMUNITY): Payer: Self-pay

## 2019-11-11 ENCOUNTER — Emergency Department (HOSPITAL_COMMUNITY)
Admission: EM | Admit: 2019-11-11 | Discharge: 2019-11-11 | Disposition: A | Discharge status: Home / Self Care | Payer: Self-pay | Attending: Emergency Medicine | Admitting: Emergency Medicine

## 2019-11-11 ENCOUNTER — Other Ambulatory Visit: Payer: Self-pay

## 2019-11-11 DIAGNOSIS — Z760 Encounter for issue of repeat prescription: Secondary | ICD-10-CM | POA: Insufficient documentation

## 2019-11-11 DIAGNOSIS — R443 Hallucinations, unspecified: Secondary | ICD-10-CM | POA: Insufficient documentation

## 2019-11-11 DIAGNOSIS — F17210 Nicotine dependence, cigarettes, uncomplicated: Secondary | ICD-10-CM | POA: Insufficient documentation

## 2019-11-11 MED ORDER — RISPERIDONE 1 MG PO TABS: ORAL_TABLET | ORAL | 0 refills | Status: DC

## 2019-11-11 MED ORDER — RISPERIDONE 1 MG PO TABS
1.0000 mg | ORAL_TABLET | Freq: Once | ORAL | Status: AC
  Administered 2019-11-11: 1 mg via ORAL
  Filled 2019-11-11: qty 1

## 2019-11-11 NOTE — ED Triage Notes (Signed)
Patient arrived stating he is hearing voices. Patient requesting his Risperdal. Denies any SI or HI at this time.

## 2019-11-11 NOTE — ED Notes (Signed)
Patient called for triage x3 each time patient states he does not want to get up to come see the doctor, to just let him sleep.

## 2019-11-11 NOTE — ED Provider Notes (Signed)
[facility]-EMERGENCY DEPT Provider Note   CSN: [id number] Arrival date & time: 11/11/19  0146     History Chief Complaint  Patient presents with  . Medication Refill    Cole Barrett is a 33 y.o. male.  The history is provided by the patient and medical records.   33 year old male with history of schizophrenia, hyperlipidemia, homelessness, presenting to the ED for hallucinations.  This is a chronic issue for patient.  This is his 24th ED visit in the past 6 months for similar related complaints.  States he has not had his Risperdal in about 3 days, since the last time he was in the ER.  States he does not currently have a prescription for his medications.  He denies any suicidal homicidal ideation.  He has been sleeping in triage with no signs of distress.  Past Medical History:  Diagnosis Date  . Homelessness   . Hypercholesterolemia   . Mental disorder   . Paranoid behavior (HCC)   . PE (pulmonary embolism)   . Schizophrenia [facility])     Patient Active Problem List   Diagnosis Date Noted  . Acute psychosis (HCC) 08/31/2019  . Schizophrenia (HCC) 08/31/2019  . Schizoaffective disorder, bipolar type (HCC) 01/19/2019  . Alcohol abuse   . Auditory hallucinations   . Alcohol abuse with alcohol-induced mood disorder (HCC) 09/08/2016  . Homelessness 09/03/2016  . Person feigning illness 09/03/2016  . 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 Cannabis use disorder, mild, abuse 08/29/2016  . Pulmonary emboli (HCC) 07/13/2016  . Adjustment disorder with emotional disturbance 03/12/2014  . 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 Acute left flank pain 01/02/2014  . Pulmonary embolism, bilateral (HCC) 01/02/2014    History reviewed. No pertinent surgical history.     Family History  Problem Relation Age of Onset  . Hypertension Mother     Social History   Tobacco Use  . Smoking status: Current Every Day Smoker   Packs/day: 0.50    Types: Cigarettes  . Smokeless tobacco: Never Used  Substance Use Topics  . Alcohol use: Yes    Comment: BAC not available  . Drug use: Yes    Types: Marijuana    Comment: Last used: 2 months ago UDS NA    Home Medications Prior to Admission medications   Medication Sig Start Date End Date Taking? Authorizing Provider  amantadine (SYMMETREL) 100 MG capsule Take 1 capsule (100 mg total) by mouth 2 (two) times daily. Patient not taking: Reported on 11/04/2019 09/01/19   [name], NP  risperiDONE (RISPERDAL) 1 MG tablet Take 1 tablet by mouth in the morning and take 3 tablets at bedtime daily. Patient taking differently: Take 1-3 mg by mouth See admin instructions. Take 1 mg by mouth in the morning and 3 mg at bedtime 10/23/19   [name], PA-C    Allergies    Haldol [haloperidol]  Review of Systems   Review of Systems  Psychiatric/Behavioral: Positive for hallucinations.  All other systems reviewed and are negative.   Physical Exam Updated Vital Signs BP (!) 115/91 (BP Location: Left Arm)   Pulse 77   Temp 98.1 F (36.7 C) (Oral)   Resp 16   SpO2 99%   Physical Exam Vitals and nursing note reviewed.  Constitutional:      Appearance: He is well-developed.     Comments: Sleeping, awoken for exam Disheveled in appearance, NAD  HENT:     Head: Normocephalic and  atraumatic.  Eyes:     Conjunctiva/sclera: Conjunctivae normal.     Pupils: Pupils are equal, round, and reactive to light.  Cardiovascular:     Rate and Rhythm: Normal rate and regular rhythm.     Heart sounds: Normal heart sounds.  Pulmonary:     Effort: Pulmonary effort is normal.     Breath sounds: Normal breath sounds.  Abdominal:     General: Bowel sounds are normal.     Palpations: Abdomen is soft.  Musculoskeletal:        General: Normal range of motion.     Cervical back: Normal range of motion.  Skin:    General: Skin is warm and dry.  Neurological:     Mental  Status: He is alert and oriented to person, place, and time.  Psychiatric:     Comments: Vague hallucinations expressed, not responding to internal stimuli Denies SI/HI     ED Results / Procedures / Treatments   Labs (all labs ordered are listed, but only abnormal results are displayed) Labs Reviewed - No data to display  EKG None  Radiology No results found.  Procedures Procedures (including critical care time)  Medications Ordered in ED Medications  risperiDONE (RISPERDAL) tablet 1 mg (1 mg Oral Given 11/11/19 0502)    ED Course  I have reviewed the triage vital signs and the nursing notes.  Pertinent labs & imaging results that were available during my care of the patient were reviewed by me and considered in my medical decision making (see chart for details).    MDM Rules/Calculators/A&P  33 year old male with history of schizophrenia, presenting to the ED with hallucinations.  Patient is well-known to this facility with 23 visits in the past 6 months for similar related complaints.  He has not had his Risperdal in several days since the last time he was in the ER.  He is sleeping on initial assessment, awoken for exam.  Expresses some vague hallucinations but does not appear to be responding to internal stimuli.  Denies any suicidal homicidal ideation.  He was given his morning dose of Risperdal, refill sent to the pharmacy.  He was encouraged to follow-up with his primary care doctor as well as his ACT team.  He may return here for any new/acute changes.  Final Clinical Impression(s) / ED Diagnoses Final diagnoses:  Medication refill    Rx / DC Orders ED Discharge Orders         Ordered    risperiDONE (RISPERDAL) 1 MG tablet     11/11/19 0507           Larene Pickett, PA-C 11/11/19 5885    Ripley Fraise, MD 11/11/19 [phone]

## 2019-11-11 NOTE — Discharge Instructions (Addendum)
I have sent a refill of your medications to your pharmacy.  Pick them up as soon as you can. Follow-up with your doctor. Return here for any new/acute changes.

## 2019-11-13 ENCOUNTER — Emergency Department (HOSPITAL_COMMUNITY)
Admission: EM | Admit: 2019-11-13 | Discharge: 2019-11-14 | Disposition: A | Discharge status: Home / Self Care | Payer: Self-pay | Attending: Emergency Medicine | Admitting: Emergency Medicine

## 2019-11-13 ENCOUNTER — Encounter (HOSPITAL_COMMUNITY): Payer: Self-pay | Admitting: *Deleted

## 2019-11-13 ENCOUNTER — Other Ambulatory Visit: Payer: Self-pay

## 2019-11-13 DIAGNOSIS — Z86711 Personal history of pulmonary embolism: Secondary | ICD-10-CM | POA: Insufficient documentation

## 2019-11-13 DIAGNOSIS — R440 Auditory hallucinations: Secondary | ICD-10-CM | POA: Insufficient documentation

## 2019-11-13 DIAGNOSIS — Z20828 Contact with and (suspected) exposure to other viral communicable diseases: Secondary | ICD-10-CM | POA: Insufficient documentation

## 2019-11-13 DIAGNOSIS — R443 Hallucinations, unspecified: Secondary | ICD-10-CM

## 2019-11-13 DIAGNOSIS — Z79899 Other long term (current) drug therapy: Secondary | ICD-10-CM | POA: Insufficient documentation

## 2019-11-13 DIAGNOSIS — F17210 Nicotine dependence, cigarettes, uncomplicated: Secondary | ICD-10-CM | POA: Insufficient documentation

## 2019-11-13 DIAGNOSIS — F250 Schizoaffective disorder, bipolar type: Secondary | ICD-10-CM | POA: Insufficient documentation

## 2019-11-13 LAB — COMPREHENSIVE METABOLIC PANEL
ALT: 41 U/L (ref 0–44)
AST: 31 U/L (ref 15–41)
Albumin: 3.9 g/dL (ref 3.5–5.0)
Alkaline Phosphatase: 43 U/L (ref 38–126)
Anion gap: 10 (ref 5–15)
BUN: 12 mg/dL (ref 6–20)
CO2: 24 mmol/L (ref 22–32)
Calcium: 8.8 mg/dL — ABNORMAL LOW (ref 8.9–10.3)
Chloride: 107 mmol/L (ref 98–111)
Creatinine, Ser: 1.13 mg/dL (ref 0.61–1.24)
GFR calc Af Amer: >60 mL/min (ref >60)
GFR calc non Af Amer: >60 mL/min (ref >60)
Glucose, Bld: 91 mg/dL (ref 70–99)
Potassium: 3.9 mmol/L (ref 3.5–5.1)
Sodium: 141 mmol/L (ref 135–145)
Total Bilirubin: 0.2 mg/dL — ABNORMAL LOW (ref 0.3–1.2)
Total Protein: 7.1 g/dL (ref 6.5–8.1)

## 2019-11-13 LAB — CBC
HCT: 39.8 % (ref 39.0–52.0)
Hemoglobin: 12.9 g/dL — ABNORMAL LOW (ref 13.0–17.0)
MCH: 28.6 pg (ref 26.0–34.0)
MCHC: 32.4 g/dL (ref 30.0–36.0)
MCV: 88.2 fL (ref 80.0–100.0)
Platelets: 323 10*3/uL (ref 150–400)
RBC: 4.51 MIL/uL (ref 4.22–5.81)
RDW: 12.8 % (ref 11.5–15.5)
WBC: 4.8 10*3/uL (ref 4.0–10.5)
nRBC: 0 % (ref 0.0–0.2)

## 2019-11-13 LAB — RAPID URINE DRUG SCREEN, HOSP PERFORMED
Amphetamines: NOT DETECTED
Barbiturates: NOT DETECTED
Benzodiazepines: NOT DETECTED
Cocaine: NOT DETECTED
Opiates: NOT DETECTED
Tetrahydrocannabinol: NOT DETECTED

## 2019-11-13 LAB — ETHANOL: Alcohol, Ethyl (B): <10 mg/dL (ref <10)

## 2019-11-13 MED ORDER — RISPERIDONE 1 MG PO TABS
3.0000 mg | ORAL_TABLET | Freq: Every day | ORAL | Status: DC
  Administered 2019-11-13: 3 mg via ORAL
  Filled 2019-11-13: qty 3

## 2019-11-13 MED ORDER — ACETAMINOPHEN 325 MG PO TABS: 650.0000 mg | ORAL_TABLET | ORAL | Status: DC | PRN

## 2019-11-13 MED ORDER — RISPERIDONE 1 MG PO TABS: 1.0000 mg | ORAL_TABLET | Freq: Every day | ORAL | Status: DC

## 2019-11-13 MED ORDER — ONDANSETRON HCL 4 MG PO TABS: 4.0000 mg | ORAL_TABLET | Freq: Three times a day (TID) | ORAL | Status: DC | PRN

## 2019-11-13 MED ORDER — ZOLPIDEM TARTRATE 5 MG PO TABS: 5.0000 mg | ORAL_TABLET | Freq: Every evening | ORAL | Status: DC | PRN

## 2019-11-13 NOTE — ED Triage Notes (Signed)
Pt reporting hearing voices, says the voices are telling him they are going to kill him. He is denying SI or HI. Stating he is taking his medications.

## 2019-11-13 NOTE — ED Provider Notes (Signed)
[facility] EMERGENCY DEPARTMENT Provider Note   CSN: [id number] Arrival date & time: 11/13/19  1918     History Chief Complaint  Patient presents with  . Hallucinations    Cole Barrett is a 33 y.o. male.  Pt presents to the ED today with hallucinations which are telling him that they are going to kill him.  He said he's been taking his meds.  He was here on 12/14 to get a rx for his meds.  He denies si/hi.        Past Medical History:  Diagnosis Date  . Homelessness   . Hypercholesterolemia   . Mental disorder   . Paranoid behavior (HCC)   . PE (pulmonary embolism)   . Schizophrenia [facility])     Patient Active Problem List   Diagnosis Date Noted  . Acute psychosis (HCC) 08/31/2019  . Schizophrenia (HCC) 08/31/2019  . Schizoaffective disorder, bipolar type (HCC) 01/19/2019  . Alcohol abuse   . Auditory hallucinations   . Alcohol abuse with alcohol-induced mood disorder (HCC) 09/08/2016  . Homelessness 09/03/2016  . Person feigning illness 09/03/2016  . 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 Cannabis use disorder, mild, abuse 08/29/2016  . Pulmonary emboli (HCC) 07/13/2016  . Adjustment disorder with emotional disturbance 03/12/2014  . 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 Acute left flank pain 01/02/2014  . Pulmonary embolism, bilateral (HCC) 01/02/2014    History reviewed. No pertinent surgical history.     Family History  Problem Relation Age of Onset  . Hypertension Mother     Social History   Tobacco Use  . Smoking status: Current Every Day Smoker    Packs/day: 0.50    Types: Cigarettes  . Smokeless tobacco: Never Used  Substance Use Topics  . Alcohol use: Yes    Comment: BAC not available  . Drug use: Yes    Types: Marijuana    Comment: Last used: 2 months ago UDS NA    Home Medications Prior to Admission medications   Medication Sig Start Date End Date Taking? Authorizing Provider    amantadine (SYMMETREL) 100 MG capsule Take 1 capsule (100 mg total) by mouth 2 (two) times daily. Patient not taking: Reported on 11/04/2019 09/01/19   [name], NP  risperiDONE (RISPERDAL) 1 MG tablet Take 1 tablet by mouth each morning.  Take 3 tablets by mouth at bedtime. 11/11/19   Garlon Hatchet, PA-C    Allergies    Haldol [haloperidol]  Review of Systems   Review of Systems  Psychiatric/Behavioral: Positive for hallucinations.    Physical Exam Updated Vital Signs BP 139/78 (BP Location: Right Arm)   Pulse 96   Temp 98.1 F (36.7 C) (Oral)   Resp 18   SpO2 100%   Physical Exam Vitals and nursing note reviewed.  Constitutional:      Appearance: Normal appearance.  HENT:     Head: Normocephalic and atraumatic.     Right Ear: External ear normal.     Left Ear: External ear normal.     Nose: Nose normal.     Mouth/Throat:     Mouth: Mucous membranes are moist.     Pharynx: Oropharynx is clear.  Eyes:     Extraocular Movements: Extraocular movements intact.     Conjunctiva/sclera: Conjunctivae normal.     Pupils: Pupils are equal, round, and reactive to light.  Cardiovascular:     Rate and Rhythm: Normal rate and regular rhythm.  Pulses: Normal pulses.     Heart sounds: Normal heart sounds.  Pulmonary:     Effort: Pulmonary effort is normal.     Breath sounds: Normal breath sounds.  Abdominal:     General: Abdomen is flat. Bowel sounds are normal.     Palpations: Abdomen is soft.  Musculoskeletal:        General: Normal range of motion.     Cervical back: Normal range of motion.  Skin:    General: Skin is warm.     Capillary Refill: Capillary refill takes less than 2 seconds.  Neurological:     General: No focal deficit present.     Mental Status: He is alert and oriented to person, place, and time.  Psychiatric:        Attention and Perception: He perceives auditory hallucinations.     ED Results / Procedures / Treatments   Labs (all labs  ordered are listed, but only abnormal results are displayed) Labs Reviewed  COMPREHENSIVE METABOLIC PANEL - Abnormal; Notable for the following components:      Result Value   Calcium 8.8 (*)    Total Bilirubin 0.2 (*)    All other components within normal limits  CBC - Abnormal; Notable for the following components:   Hemoglobin 12.9 (*)    All other components within normal limits  SARS CORONAVIRUS 2 (TAT 6-24 HRS)  ETHANOL  RAPID URINE DRUG SCREEN, HOSP PERFORMED    EKG None  Radiology No results found.  Procedures Procedures (including critical care time)  Medications Ordered in ED Medications  acetaminophen (TYLENOL) tablet 650 mg (has no administration in time range)  zolpidem (AMBIEN) tablet 5 mg (has no administration in time range)  ondansetron (ZOFRAN) tablet 4 mg (has no administration in time range)  risperiDONE (RISPERDAL) tablet 1 mg (has no administration in time range)    ED Course  I have reviewed the triage vital signs and the nursing notes.  Pertinent labs & imaging results that were available during my care of the patient were reviewed by me and considered in my medical decision making (see chart for details).    MDM Rules/Calculators/A&P                     Pt is medically clear for a MH eval.  Covid test ordered, home meds ordered, diet ordered, TTS consult ordered. Final Clinical Impression(s) / ED Diagnoses Final diagnoses:  Hallucinations    Rx / DC Orders ED Discharge Orders    None       Isla Pence, MD 11/13/19 2054

## 2019-11-14 LAB — SARS CORONAVIRUS 2 (TAT 6-24 HRS): SARS Coronavirus 2: NEGATIVE

## 2019-11-14 NOTE — BH Assessment (Signed)
Tele Assessment Note   Patient Name: Cole Barrett MRN: [id number] Referring Physician: Dr. August Albino.  Location of Patient: [facility], H020C. Location of Provider: Behavioral Health TTS Department  Cole Barrett is an 33 y.o. male, who presents voluntary and unaccompanied to [facility]. Per chart, pt has 22  ED visits in the past six months for similar presentations. Clinician asked the pt, "what brought you to the hospital?" Pt reported, two weeks ago be was doing good but today his voices got loud, saying they are going to kill him. Pt reported, wanting to talk to a doctor to see if he can get something to help the voices go away, permanently. Pt reported wanting to come to [facility]. Pt reported, he seen his psychiatrist at [facility], "the other day," for a refill of Risperdal. However pt chart pt was at [facility] on 11/11/2019 requesting a refill for Risperdal. Per chart, on 11/11/2019, the pt was given a morning dose of Risperdal and the EDP sent a refill to his pharmacy. Pt denies, SI, HI, self-injurious behaviors, depressive symptoms, stressors and access to weapons.   Pt denies, history of abuse. Pt reported, taking his medication as prescribed. Pt's UDS is negative. Pt reported, previous inpatient admissions.  Pt presents quiet, awake in scrubs with logical, coherent speech. Pt's eye contact was fair. Pt's mood, affect was pleasant. Pt's thought process was coherent, relevant. Pt's judgement was partial. Pt was oriented x4. Pt's concentration, insight and impulse control was fair. Pt reported, if discharged from [facility] he could contract for safety.   Diagnosis: Schizophrenia [facility])  Past Medical History:  Past Medical History:  Diagnosis Date  . Homelessness   . Hypercholesterolemia   . Mental disorder   . Paranoid behavior (HCC)   . PE (pulmonary embolism)   . Schizophrenia (HCC)     History reviewed. No pertinent surgical history.  Family History:  Family  History  Problem Relation Age of Onset  . Hypertension Mother     Social History:  reports that he has been smoking cigarettes. He has been smoking about 0.50 packs per day. He has never used smokeless tobacco. He reports current alcohol use. He reports current drug use. Drug: Marijuana.  Additional Social History:  Alcohol / Drug Use Pain Medications: See MAR Prescriptions: See MAR Over the Counter: See MAR History of alcohol / drug use?: No history of alcohol / drug abuse  CIWA: CIWA-Ar BP: 139/78 Pulse Rate: 96 COWS:    Allergies:  Allergies  Allergen Reactions  . Haldol [Haloperidol] Shortness Of Breath    Home Medications: (Not in a hospital admission)   OB/GYN Status:  No LMP for male patient.  General Assessment Data Location of Assessment: [facility] ED TTS Assessment: In system Is this a Tele or Face-to-Face Assessment?: Tele Assessment Is this an Initial Assessment or a Re-assessment for this encounter?: Initial Assessment Patient Accompanied by:: N/A Language Other than English: No Living Arrangements: Other (Comment)(Alone.) What gender do you identify as?: Male Marital status: Single Living Arrangements: Alone Can pt return to current living arrangement?: Yes Admission Status: Voluntary Is patient capable of signing voluntary admission?: Yes Referral Source: Self/Family/Friend Insurance type: Self-pay.      Crisis Care Plan Living Arrangements: Alone Legal Guardian: Other:(Self. ) Name of Psychiatrist: [facility].  Name of Therapist: NA  Education Status Is patient currently in school?: No Is the patient employed, unemployed or receiving disability?: Employed([employer]  Risk to self with the past 6 months Suicidal Ideation:  No(Pt denies. ) Has patient been a risk to self within the past 6 months prior to admission? : No Suicidal Intent: No Has patient had any suicidal intent within the past 6 months prior to admission? : No Is patient at risk for  suicide?: No Suicidal Plan?: No Has patient had any suicidal plan within the past 6 months prior to admission? : No Access to Means: No(Pt denies. ) What has been your use of drugs/alcohol within the last 12 months?: UDS is negative.  Previous Attempts/Gestures: No(Pt denies. ) How many times?: 0 Other Self Harm Risks: NA Triggers for Past Attempts: None known Intentional Self Injurious Behavior: None(Pt denies. ) Family Suicide History: No Recent stressful life event(s): Other (Comment)(Per pt, "not much of nothing." ) Persecutory voices/beliefs?: Yes Depression: No(Pt denies. ) Depression Symptoms: (Pt denies. ) Substance abuse history and/or treatment for substance abuse?: No Suicide prevention information given to non-admitted patients: Not applicable  Risk to Others within the past 6 months Homicidal Ideation: No(Pt denies. ) Does patient have any lifetime risk of violence toward others beyond the six months prior to admission? : No(Pt denies. ) Thoughts of Harm to Others: No Current Homicidal Intent: No Current Homicidal Plan: No Access to Homicidal Means: No(Pt denies. ) Identified Victim: NA History of harm to others?: No Assessment of Violence: None Noted Violent Behavior Description: NA Does patient have access to weapons?: No(Pt denies. ) Criminal Charges Pending?: No Describe Pending Criminal Charges: NA Does patient have a court date: No Is patient on probation?: No  Psychosis Hallucinations: Auditory Delusions: None noted  Mental Status Report Appearance/Hygiene: In scrubs Eye Contact: Fair Motor Activity: Unremarkable Speech: Logical/coherent Level of Consciousness: Quiet/awake Mood: Pleasant Affect: Other (Comment)(pleasant. ) Anxiety Level: None Thought Processes: Coherent, Relevant Judgement: Partial Orientation: Person, Place, Time, Situation Obsessive Compulsive Thoughts/Behaviors: None  Cognitive Functioning Concentration: Fair Memory:  Recent Intact Is patient IDD: No Insight: Fair Impulse Control: Fair Appetite: Good Have you had any weight changes? : No Change Sleep: No Change Total Hours of Sleep: 8 Vegetative Symptoms: None  ADLScreening [facility] Assessment Services) Patient's cognitive ability adequate to safely complete daily activities?: Yes Patient able to express need for assistance with ADLs?: Yes Independently performs ADLs?: Yes (appropriate for developmental age)  Prior Inpatient Therapy Prior Inpatient Therapy: Yes Prior Therapy Dates: Per chart, "2018, 2019."  [facility] Reason for Treatment: AH.  Prior Outpatient Therapy Prior Outpatient Therapy: Yes Prior Therapy Dates: Current. Prior Therapy Facilty/Provider(s): [facility] Reason for Treatment: Medication management.  Does patient have an ACCT team?: No Does patient have Intensive In-House Services?  : No Does patient have [facility] services? : Yes Does patient have P4CC services?: No  ADL Screening (condition at time of admission) Patient's cognitive ability adequate to safely complete daily activities?: Yes Is the patient deaf or have difficulty hearing?: No Does the patient have difficulty seeing, even when wearing glasses/contacts?: No Does the patient have difficulty concentrating, remembering, or making decisions?: No Patient able to express need for assistance with ADLs?: Yes Does the patient have difficulty dressing or bathing?: No Independently performs ADLs?: Yes (appropriate for developmental age) Does the patient have difficulty walking or climbing stairs?: No Weakness of Legs: None Weakness of Arms/Hands: None  Home Assistive Devices/Equipment Home Assistive Devices/Equipment: None    Abuse/Neglect Assessment (Assessment to be complete while patient is alone) Abuse/Neglect Assessment Can Be Completed: Yes Physical Abuse: Denies Verbal Abuse: Denies Sexual Abuse: Denies Exploitation of  patient/patient's resources: Denies  Self-Neglect: Denies     Advance Directives (For Healthcare) Does Patient Have a Medical Advance Directive?: No Would patient like information on creating a medical advance directive?: No - Patient declined          Disposition: Talbot Grumbling, NP recommends pt does not meet inpatient treatment criteria. Pt to follow up with [facility] on medication. Disposition discussed with Dr. Randal Buba and Claiborne Billings, RN.   Disposition Initial Assessment Completed for this Encounter: Yes  This service was provided via telemedicine using a 2-way, interactive audio and video technology.  Names of all persons participating in this telemedicine service and their role in this encounter. Name: Cole Barrett. Role: Patient.   Name: Vertell Novak, MS, [facility], [place]. Role: Counselor.          Vertell Novak 11/14/2019 1:54 AM    Vertell Novak, MS, [facility], [place] Triage Specialist [phone]

## 2019-11-14 NOTE — [facility] Counselor (Signed)
Pt denies, having family, friend supports.   Trask Vosler D Delita Chiquito, MS, L[facility], [facility] Triage Specialist [phone]  

## 2019-11-17 ENCOUNTER — Emergency Department (HOSPITAL_COMMUNITY)
Admission: EM | Admit: 2019-11-17 | Discharge: 2019-11-17 | Disposition: A | Discharge status: Home / Self Care | Payer: Self-pay | Attending: Emergency Medicine | Admitting: Emergency Medicine

## 2019-11-17 ENCOUNTER — Other Ambulatory Visit: Payer: Self-pay

## 2019-11-17 ENCOUNTER — Encounter (HOSPITAL_COMMUNITY): Payer: Self-pay

## 2019-11-17 DIAGNOSIS — R443 Hallucinations, unspecified: Secondary | ICD-10-CM

## 2019-11-17 DIAGNOSIS — R440 Auditory hallucinations: Secondary | ICD-10-CM | POA: Insufficient documentation

## 2019-11-17 DIAGNOSIS — F17210 Nicotine dependence, cigarettes, uncomplicated: Secondary | ICD-10-CM | POA: Insufficient documentation

## 2019-11-17 MED ORDER — RISPERIDONE 2 MG PO TABS
3.0000 mg | ORAL_TABLET | Freq: Once | ORAL | Status: AC
  Administered 2019-11-17: 3 mg via ORAL
  Filled 2019-11-17: qty 1

## 2019-11-17 NOTE — ED Triage Notes (Signed)
Pt states he is hearing voices, that state they are going to kill him. Pt denies SI/HI. Pt states he is compliant with medications and picked up his new prescriptions from last week.

## 2019-11-17 NOTE — ED Provider Notes (Signed)
[facility]-EMERGENCY DEPT Provider Note   CSN: [id number] Arrival date & time: 11/17/19  4782     History Chief Complaint  Patient presents with  . Hallucinations    Cole Barrett is a 33 y.o. male with a history of homelessness, frequent ER use, and schizophrenia who presents to the emergency department with a chief complaint of auditory hallucinations.  This is a chronic issue for him.  He denies visual hallucinations, SI, or HI.  Patient is well-known to this ER with 23 visits in the last 6 months.  He reports that he has been taking his home Risperdal, but has not yet taken his nighttime dose.  He tells triage staff that he picked up his new prescription from last week.  He is sleeping in triage and is in no acute distress.  The history is provided by the patient. No language interpreter was used.       Past Medical History:  Diagnosis Date  . Homelessness   . Hypercholesterolemia   . Mental disorder   . Paranoid behavior (HCC)   . PE (pulmonary embolism)   . Schizophrenia [facility])     Patient Active Problem List   Diagnosis Date Noted  . Acute psychosis (HCC) 08/31/2019  . Schizophrenia (HCC) 08/31/2019  . Schizoaffective disorder, bipolar type (HCC) 01/19/2019  . Alcohol abuse   . Auditory hallucinations   . Alcohol abuse with alcohol-induced mood disorder (HCC) 09/08/2016  . Homelessness 09/03/2016  . Person feigning illness 09/03/2016  . 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 Cannabis use disorder, mild, abuse 08/29/2016  . Pulmonary emboli (HCC) 07/13/2016  . Adjustment disorder with emotional disturbance 03/12/2014  . 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 Acute left flank pain 01/02/2014  . Pulmonary embolism, bilateral (HCC) 01/02/2014    History reviewed. No pertinent surgical history.     Family History  Problem Relation Age of Onset  . Hypertension Mother     Social History   Tobacco Use  .  Smoking status: Current Every Day Smoker    Packs/day: 0.50    Types: Cigarettes  . Smokeless tobacco: Never Used  Substance Use Topics  . Alcohol use: Yes    Comment: BAC not available  . Drug use: Yes    Types: Marijuana    Comment: Last used: 2 months ago UDS NA    Home Medications Prior to Admission medications   Medication Sig Start Date End Date Taking? Authorizing Provider  risperiDONE (RISPERDAL) 1 MG tablet Take 1 tablet by mouth each morning.  Take 3 tablets by mouth at bedtime. 11/11/19  Yes Garlon Hatchet, PA-C  amantadine (SYMMETREL) 100 MG capsule Take 1 capsule (100 mg total) by mouth 2 (two) times daily. Patient not taking: Reported on 11/04/2019 09/01/19 11/17/19  [name], NP    Allergies    Haldol [haloperidol]  Review of Systems   Review of Systems  Constitutional: Negative for activity change, chills and fever.  Respiratory: Negative for shortness of breath.   Cardiovascular: Negative for chest pain.  Gastrointestinal: Negative for abdominal pain, diarrhea, nausea and vomiting.  Musculoskeletal: Negative for back pain, myalgias, neck pain and neck stiffness.  Skin: Negative for rash.  Neurological: Negative for dizziness, weakness and numbness.  Psychiatric/Behavioral: Positive for hallucinations (chronic). Negative for agitation, confusion and suicidal ideas. The patient is not nervous/anxious.     Physical Exam Updated Vital Signs BP 137/83 (BP Location: Left Arm)   Pulse 92  Temp 98.2 F (36.8 C) (Oral)   Resp 18   SpO2 98%   Physical Exam Vitals and nursing note reviewed.  Constitutional:      Appearance: He is well-developed.     Comments: Sleeping comfortably and in no acute distress on arrival.  Easily arouses to voice.  HENT:     Head: Normocephalic.  Eyes:     Conjunctiva/sclera: Conjunctivae normal.  Cardiovascular:     Rate and Rhythm: Normal rate and regular rhythm.     Pulses: Normal pulses.     Heart sounds: Normal  heart sounds. No murmur. No friction rub. No gallop.   Pulmonary:     Effort: Pulmonary effort is normal. No respiratory distress.     Breath sounds: No stridor. No wheezing, rhonchi or rales.  Chest:     Chest wall: No tenderness.  Abdominal:     General: There is no distension.     Palpations: Abdomen is soft. There is no mass.     Tenderness: There is no abdominal tenderness. There is no right CVA tenderness, left CVA tenderness, guarding or rebound.     Hernia: No hernia is present.  Musculoskeletal:     Cervical back: Neck supple.  Skin:    General: Skin is warm and dry.  Neurological:     Mental Status: He is alert.     Comments: Ambulates independently without difficulty.  Psychiatric:        Attention and Perception: Attention and perception normal.        Speech: Speech normal.        Behavior: Behavior normal. Behavior is cooperative.        Thought Content: Thought content does not include homicidal or suicidal ideation. Thought content does not include homicidal or suicidal plan.     ED Results / Procedures / Treatments   Labs (all labs ordered are listed, but only abnormal results are displayed) Labs Reviewed - No data to display  EKG None  Radiology No results found.  Procedures Procedures (including critical care time)  Medications Ordered in ED Medications  risperiDONE (RISPERDAL) tablet 3 mg (has no administration in time range)    ED Course  I have reviewed the triage vital signs and the nursing notes.  Pertinent labs & imaging results that were available during my care of the patient were reviewed by me and considered in my medical decision making (see chart for details).    MDM Rules/Calculators/A&P                      33 year old male with a history of schizophrenia, homelessness, and frequent ER use who is here for auditory hallucinations.  He is well-known to this ER and to me with 23 visits in the last 6 months for similar complaints.  On  initial exam, he is sleeping soundly and in no acute distress.  He was awakened for exam.    He was assessed by TTS on 12/17 and did not meet inpatient criteria.  He denies SI or HI.  He does not appear to be responding to internal stimuli.  At this time, I do not feel that repeat TTS consult is indicated.  He is nontoxic-appearing.  He reports compliance with his home Risperdal, but has not taken his nighttime dose, which has been ordered here.  ER return precautions were discussed.  He was advised to follow-up with [facility].  He is hemodynamically stable and safe for discharge at this time.  Final Clinical Impression(s) / ED Diagnoses Final diagnoses:  Hallucinations    Rx / DC Orders ED Discharge Orders    None       Joanne Gavel, PA-C 11/17/19 0449    Merryl Hacker, MD 11/17/19 [phone]

## 2019-11-17 NOTE — Discharge Instructions (Signed)
Thank you for allowing me to care for you today in the Emergency Department.   Follow up with [facility] as needed.   Take your home Risperdal.  Return to the emergency department for new or worsening symptoms.

## 2019-11-18 ENCOUNTER — Ambulatory Visit (HOSPITAL_COMMUNITY)
Admission: RE | Admit: 2019-11-18 | Discharge: 2019-11-18 | Disposition: A | Discharge status: Home / Self Care | Payer: No Typology Code available for payment source | Attending: Psychiatry | Admitting: Psychiatry

## 2019-11-18 DIAGNOSIS — F329 Major depressive disorder, single episode, unspecified: Secondary | ICD-10-CM | POA: Insufficient documentation

## 2019-11-18 DIAGNOSIS — R440 Auditory hallucinations: Secondary | ICD-10-CM | POA: Insufficient documentation

## 2019-11-18 NOTE — H&P (Signed)
Behavioral Health Medical Screening Exam  Cole Barrett is an 33 y.o. male.  Total Time spent with patient: 15 minutes  Psychiatric Specialty Exam: Physical Exam  Constitutional: He is oriented to person, place, and time. He appears well-developed and well-nourished. No distress.  HENT:  Head: Normocephalic and atraumatic.  Right Ear: External ear normal.  Left Ear: External ear normal.  Eyes: Pupils are equal, round, and reactive to light. Right eye exhibits no discharge. Left eye exhibits no discharge.  Respiratory: Effort normal. No respiratory distress.  Musculoskeletal:        General: Normal range of motion.  Neurological: He is alert and oriented to person, place, and time.  Skin: He is not diaphoretic.  Psychiatric: His speech is normal. His mood appears anxious. Thought content is not paranoid and not delusional. He exhibits a depressed mood. He expresses no homicidal and no suicidal ideation.    Review of Systems  Constitutional: Negative for activity change, appetite change, chills, diaphoresis, fatigue, fever and unexpected weight change.  Respiratory: Negative for cough, chest tightness and shortness of breath.   Cardiovascular: Negative for chest pain.  Gastrointestinal: Negative for diarrhea, nausea and vomiting.  Psychiatric/Behavioral: Positive for dysphoric mood and hallucinations. Negative for self-injury and suicidal ideas. The patient is nervous/anxious.     Blood pressure 125/69, pulse 87, temperature (!) 97.4 F (36.3 C), temperature source Oral, resp. rate 20.There is no height or weight on file to calculate BMI.  General Appearance: Casual and Fairly Groomed  Eye Contact:  Good  Speech:  Clear and Coherent and Normal Rate  Volume:  Normal  Mood:  Anxious and Depressed  Affect:  Blunt  Thought Process:  Coherent, Goal Directed, Linear and Descriptions of Associations: Intact  Orientation:  Full (Time, Place, and Person)  Thought Content:   Hallucinations: Auditory  Suicidal Thoughts:  No  Homicidal Thoughts:  No  Memory:  Immediate;   Good Recent;   Good  Judgement:  Fair  Insight:  Fair  Psychomotor Activity:  Normal  Concentration: Concentration: Good  Recall:  Good  Fund of Knowledge:Good  Language: Good  Akathisia:  Negative  Handed:  Right  AIMS (if indicated):     Assets:  Communication Skills Desire for Improvement Financial Resources/Insurance Housing Leisure Time Physical Health  Sleep:       Musculoskeletal: Strength & Muscle Tone: within normal limits Gait & Station: normal Patient leans: N/A  Blood pressure 125/69, pulse 87, temperature (!) 97.4 F (36.3 C), temperature source Oral, resp. rate 20.  Recommendations:  Based on my evaluation the patient does not appear to have an emergency medical condition.   Disposition: No evidence of imminent risk to self or others at present.   Patient does not meet criteria for psychiatric inpatient admission. Supportive therapy provided about ongoing stressors. Discussed crisis plan, support from social network, calling 911, coming to the Emergency Department, and calling Suicide Hotline.  Rozetta Nunnery, NP 11/18/2019, 4:03 AM

## 2019-11-18 NOTE — BH Assessment (Signed)
Assessment Note  Cole Barrett is a 33 y.o. male who was brought to [facility] via EMS at his request due to his ongoing symptoms of hearing voices telling him that they are going to kill him. Pt states he has been taking his medication as prescribed (82m Risperdal AM and 387mRisperdal PM), though he has not yet taken his PM dose tonight/this early AM. Pt denies current SI, prior SI, a plan to kill himself, or prior hospitalizations due to SI. Pt denies HI, VH, NSSIB, access to guns/weapons, engagement in the legal system, or SA.  Pt identified that the voices he is currently experiencing are no worse than they typically are--he states he is simply "tired" of hearing them. Pt denies he has plans for the holidays and states he is not experiencing additional stressors due to this; he states he simply is frustrated with the voices and wants them to stop.  Pt declined to provide clinician verbal consent so she could contact a friend or family member for collateral information.  Pt is oriented x4. His recent and remote memory is intact. Pt was cooperative and pleasant throughout the assessment process. Pt's insight, judgement, and impulse control is fair at this time.   Diagnosis: F20.9, Schizophrenia   Past Medical History:  Past Medical History:  Diagnosis Date  . Homelessness   . Hypercholesterolemia   . Mental disorder   [place]  . PE ([place]ulmonary embolism)   . [place]    No past surgical history on file.  Family History:  Family History  Problem Relation Age of Onset  . Hypertension Mother     Social History:  reports that he has been smoking cigarettes. He has been smoking about 0.50 packs per day. He has never used smokeless tobacco. He reports current alcohol use. He reports current drug use. Drug: Marijuana.  Additional Social History:  Alcohol / Drug Use Pain Medications: Please see MAR Prescriptions: Please see MAR Over the Counter:  Please see MAR History of alcohol / drug use?: No history of alcohol / drug abuse Longest period of sobriety (when/how long): N/A  CIWA: CIWA-Ar BP: 125/69 Pulse Rate: 87 COWS:    Allergies:  Allergies  Allergen Reactions  . Haldol [Haloperidol] Shortness Of Breath    Home Medications: (Not in a hospital admission)   OB/GYN Status:  No LMP for male patient.  General Assessment [facility] Assessment: In system Is this a Tele or Face-to-Face Assessment?: Face-to-Face Is this an Initial Assessment or a Re-assessment for this encounter?: Initial Assessment Patient Accompanied by:: N/A Language Other than English: No Living Arrangements: Other (Comment)(Pt lives in his own home) What gender do you identify as?: Male Marital status: Single Living Arrangements: Alone Can pt return to current living arrangement?: Yes Admission Status: Voluntary Is patient capable of signing voluntary admission?: Yes Referral Source: Self/Family/Friend Insurance type: Self-pay  Medical Screening Exam (BHDownsMedical Exam completed: Yes  Crisis Care Plan Living Arrangements: Alone Legal Guardian: Other:(Self) Name of Psychiatrist: Mo[place]ame of Therapist: None  Education Status Is patient currently in school?: No Is the patient employed, unemployed or receiving disability?: Employed([employer])  Risk to self with the past 6 months Suicidal Ideation: No Has patient been a risk to self within the past 6 months prior to admission? : No Suicidal Intent: No Has patient had any suicidal intent within the past 6 months prior to admission? : No Is  patient at risk for suicide?: No Suicidal Plan?: No Has patient had any suicidal plan within the past 6 months prior to admission? : No Access to Means: No What has been your use of drugs/alcohol within the last 12 months?: Pt denies SA Previous Attempts/Gestures: No How many times?:  0 Other Self Harm Risks: None noted Triggers for Past Attempts: None known Intentional Self Injurious Behavior: None Family Suicide History: No Recent stressful life event(s): Turmoil (Comment)(Pt experiences AH telling him they are going to kill him) Persecutory voices/beliefs?: Yes Depression: No Depression Symptoms: (Pt denies) Substance abuse history and/or treatment for substance abuse?: No Suicide prevention information given to non-admitted patients: Not applicable  Risk to Others within the past 6 months Homicidal Ideation: No Does patient have any lifetime risk of violence toward others beyond the six months prior to admission? : No Thoughts of Harm to Others: No Current Homicidal Intent: No Current Homicidal Plan: No Access to Homicidal Means: No Identified Victim: None noted History of harm to others?: No Assessment of Violence: None Noted Violent Behavior Description: None noted Does patient have access to weapons?: No(Pt denies access to guns/weapons) Criminal Charges Pending?: No Describe Pending Criminal Charges: None noted Does patient have a court date: No Is patient on probation?: No  Psychosis Hallucinations: Auditory Delusions: None noted  Mental Status Report Appearance/Hygiene: Unremarkable, Other (Comment)(Pt is wearing some Christmas attire (a flashing necklace)) Eye Contact: Good Motor Activity: Unremarkable Speech: Logical/coherent Level of Consciousness: Quiet/awake Mood: Pleasant, Other (Comment)(More quiet than usual) Affect: Flat Anxiety Level: None Thought Processes: Coherent Judgement: Unimpaired Orientation: Person, Place, Time, Situation Obsessive Compulsive Thoughts/Behaviors: None  Cognitive Functioning Concentration: Decreased Memory: Recent Intact, Remote Intact Is patient IDD: No Insight: Fair Impulse Control: Fair Appetite: Good Have you had any weight changes? : No Change Sleep: No Change Total Hours of Sleep:  7 Vegetative Symptoms: None  ADLScreening [facility] Services) Patient's cognitive ability adequate to safely complete daily activities?: Yes Patient able to express need for assistance with ADLs?: Yes Independently performs ADLs?: Yes (appropriate for developmental age)  Prior Inpatient Therapy Prior Inpatient Therapy: Yes Prior Therapy Dates: Per chart, "2018, 2019" Prior Therapy Facilty/Provider(s): [facility] Reason for Treatment: AH  Prior Outpatient Therapy Prior Outpatient Therapy: No Does patient have an ACCT team?: No Does patient have Intensive In-House Services?  : No Does patient have [facility] services? : Yes Does patient have P4CC services?: No  ADL Screening (condition at time of admission) Patient's cognitive ability adequate to safely complete daily activities?: Yes Is the patient deaf or have difficulty hearing?: No Does the patient have difficulty seeing, even when wearing glasses/contacts?: No Does the patient have difficulty concentrating, remembering, or making decisions?: No Patient able to express need for assistance with ADLs?: Yes Does the patient have difficulty dressing or bathing?: No Independently performs ADLs?: Yes (appropriate for developmental age) Does the patient have difficulty walking or climbing stairs?: No Weakness of Legs: None Weakness of Arms/Hands: None  Home Assistive Devices/Equipment Home Assistive Devices/Equipment: None  Therapy Consults (therapy consults require a physician order) PT Evaluation Needed: No OT Evalulation Needed: No SLP Evaluation Needed: No Abuse/Neglect Assessment (Assessment to be complete while patient is alone) Abuse/Neglect Assessment Can Be Completed: Yes Physical Abuse: Denies Verbal Abuse: Denies Sexual Abuse: Denies Exploitation of patient/patient's resources: Denies Self-Neglect: Denies Values / Beliefs Cultural Requests During Hospitalization: None Spiritual Requests During Hospitalization:  None Consults Spiritual Care Consult Needed: No Transition of Care Team Consult Needed: No Advance Directives (  For Healthcare) Does Patient Have a Medical Advance Directive?: No Would patient like information on creating a medical advance directive?: No - Patient declined         Disposition: Lindon Romp, NP, reviewed pt's chart and information and met with pt and determined pt does not meet criteria for inpatient hospitalization at this time and can be psych cleared. Pt was advised to f/u with his [place] providers. Pt expressed an understanding.   Disposition Initial Assessment Completed for this Encounter: Yes Disposition of Patient: Discharge(Jason Gwenlyn Found, NP, determined pt can be psych cleared) Patient refused recommended treatment: No Mode of transportation if patient is discharged/movement?: Car Patient referred to: Other (Comment)(Pt should f/u with his providers)  On Site Evaluation by:   Reviewed with Physician:    Dannielle Burn 11/18/2019 3:42 AM

## 2019-11-19 ENCOUNTER — Other Ambulatory Visit: Payer: Self-pay

## 2019-11-19 ENCOUNTER — Encounter (HOSPITAL_COMMUNITY): Payer: Self-pay | Admitting: Emergency Medicine

## 2019-11-19 ENCOUNTER — Emergency Department (HOSPITAL_COMMUNITY)
Admission: EM | Admit: 2019-11-19 | Discharge: 2019-11-20 | Disposition: A | Discharge status: Home / Self Care | Payer: Self-pay | Attending: Emergency Medicine | Admitting: Emergency Medicine

## 2019-11-19 DIAGNOSIS — Z590 Homelessness: Secondary | ICD-10-CM | POA: Insufficient documentation

## 2019-11-19 DIAGNOSIS — R440 Auditory hallucinations: Secondary | ICD-10-CM | POA: Insufficient documentation

## 2019-11-19 DIAGNOSIS — F17210 Nicotine dependence, cigarettes, uncomplicated: Secondary | ICD-10-CM | POA: Insufficient documentation

## 2019-11-19 DIAGNOSIS — R443 Hallucinations, unspecified: Secondary | ICD-10-CM

## 2019-11-19 DIAGNOSIS — F200 Paranoid schizophrenia: Secondary | ICD-10-CM | POA: Insufficient documentation

## 2019-11-19 DIAGNOSIS — Z86711 Personal history of pulmonary embolism: Secondary | ICD-10-CM | POA: Insufficient documentation

## 2019-11-19 LAB — COMPREHENSIVE METABOLIC PANEL
ALT: 36 U/L (ref 0–44)
AST: 26 U/L (ref 15–41)
Albumin: 3.9 g/dL (ref 3.5–5.0)
Alkaline Phosphatase: 47 U/L (ref 38–126)
Anion gap: 12 (ref 5–15)
BUN: 6 mg/dL (ref 6–20)
CO2: 21 mmol/L — ABNORMAL LOW (ref 22–32)
Calcium: 8.5 mg/dL — ABNORMAL LOW (ref 8.9–10.3)
Chloride: 102 mmol/L (ref 98–111)
Creatinine, Ser: 0.98 mg/dL (ref 0.61–1.24)
GFR calc Af Amer: >60 mL/min (ref >60)
GFR calc non Af Amer: >60 mL/min (ref >60)
Glucose, Bld: 150 mg/dL — ABNORMAL HIGH (ref 70–99)
Potassium: 3.4 mmol/L — ABNORMAL LOW (ref 3.5–5.1)
Sodium: 135 mmol/L (ref 135–145)
Total Bilirubin: 0.7 mg/dL (ref 0.3–1.2)
Total Protein: 7.5 g/dL (ref 6.5–8.1)

## 2019-11-19 LAB — ETHANOL: Alcohol, Ethyl (B): 47 mg/dL — ABNORMAL HIGH (ref <10)

## 2019-11-19 LAB — CBC
HCT: 40.5 % (ref 39.0–52.0)
Hemoglobin: 13.2 g/dL (ref 13.0–17.0)
MCH: 28.1 pg (ref 26.0–34.0)
MCHC: 32.6 g/dL (ref 30.0–36.0)
MCV: 86.2 fL (ref 80.0–100.0)
Platelets: 325 10*3/uL (ref 150–400)
RBC: 4.7 MIL/uL (ref 4.22–5.81)
RDW: 12.6 % (ref 11.5–15.5)
WBC: 4.5 10*3/uL (ref 4.0–10.5)
nRBC: 0 % (ref 0.0–0.2)

## 2019-11-19 NOTE — ED Triage Notes (Addendum)
Patient reports auditory hallucinations this week , denies SI/HI , calm and cooperative at triage . He has not taken his medications for several days .

## 2019-11-20 MED ORDER — RISPERIDONE 1 MG PO TABS
1.0000 mg | ORAL_TABLET | Freq: Once | ORAL | Status: AC
  Administered 2019-11-20: 1 mg via ORAL
  Filled 2019-11-20: qty 1

## 2019-11-20 NOTE — Discharge Instructions (Signed)
Follow-up with your psychiatrist. Continue your risperdal as directed. Return here for any new/acute changes.

## 2019-11-20 NOTE — ED Provider Notes (Signed)
[name] EMERGENCY DEPARTMENT Provider Note   CSN: [id number] Arrival date & time: 11/19/19  2254     History Chief Complaint  Patient presents with  . Hallucinations    Auditory    Cole Barrett is a 33 y.o. male.  The history is provided by the patient and medical records.    33 year old male with history of homelessness, paranoid schizophrenia, chronic auditory hallucinations, presenting to the ED with hallucinations.  States they are the same hallucinations he always hears, however "they got bad today".  States he has not had his medications in several days.  He denies any suicidal homicidal ideation.  He states he was not scared, reports "I was just trying to get some help with this".    Patient with psychiatric assessments done in ED on 12/7, 12/17, and 12/21 without meeting IP criteria.  Past Medical History:  Diagnosis Date  . Homelessness   . Hypercholesterolemia   . Mental disorder   . Paranoid behavior (HCC)   . PE (pulmonary embolism)   . Schizophrenia [facility])     Patient Active Problem List   Diagnosis Date Noted  . Acute psychosis (HCC) 08/31/2019  . Schizophrenia (HCC) 08/31/2019  . Schizoaffective disorder, bipolar type (HCC) 01/19/2019  . Alcohol abuse   . Auditory hallucinations   . Alcohol abuse with alcohol-induced mood disorder (HCC) 09/08/2016  . Homelessness 09/03/2016  . Person feigning illness 09/03/2016  . 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 Cannabis use disorder, mild, abuse 08/29/2016  . Pulmonary emboli (HCC) 07/13/2016  . Adjustment disorder with emotional disturbance 03/12/2014  . 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 Acute left flank pain 01/02/2014  . Pulmonary embolism, bilateral (HCC) 01/02/2014    History reviewed. No pertinent surgical history.     Family History  Problem Relation Age of Onset  . Hypertension Mother     Social History   Tobacco Use  . Smoking  status: Current Every Day Smoker    Packs/day: 0.50    Types: Cigarettes  . Smokeless tobacco: Never Used  Substance Use Topics  . Alcohol use: Yes    Comment: BAC not available  . Drug use: Yes    Types: Marijuana    Comment: Last used: 2 months ago UDS NA    Home Medications Prior to Admission medications   Medication Sig Start Date End Date Taking? Authorizing Provider  risperiDONE (RISPERDAL) 1 MG tablet Take 1 tablet by mouth each morning.  Take 3 tablets by mouth at bedtime. 11/11/19   Garlon Hatchet, PA-C  amantadine (SYMMETREL) 100 MG capsule Take 1 capsule (100 mg total) by mouth 2 (two) times daily. Patient not taking: Reported on 11/04/2019 09/01/19 11/17/19  [name], NP    Allergies    Haldol [haloperidol]  Review of Systems   Review of Systems  Psychiatric/Behavioral: Positive for hallucinations.  All other systems reviewed and are negative.   Physical Exam Updated Vital Signs BP 140/88 (BP Location: Left Arm)   Pulse (!) 101   Temp 97.6 F (36.4 C) (Oral)   Resp 20   SpO2 99%   Physical Exam Vitals and nursing note reviewed.  Constitutional:      Appearance: He is well-developed.     Comments: Disheveled appearing  HENT:     Head: Normocephalic and atraumatic.  Eyes:     Conjunctiva/sclera: Conjunctivae normal.     Pupils: Pupils are equal, round, and reactive to light.  Cardiovascular:  Rate and Rhythm: Normal rate and regular rhythm.     Heart sounds: Normal heart sounds.  Pulmonary:     Effort: Pulmonary effort is normal. No respiratory distress.     Breath sounds: Normal breath sounds. No stridor.  Abdominal:     General: Bowel sounds are normal.     Palpations: Abdomen is soft.  Musculoskeletal:        General: Normal range of motion.     Cervical back: Normal range of motion.  Skin:    General: Skin is warm and dry.  Neurological:     Mental Status: He is alert and oriented to person, place, and time.  Psychiatric:      Comments: Endorses hallucinations, does not appear to be responding to internal stimuli     ED Results / Procedures / Treatments   Labs (all labs ordered are listed, but only abnormal results are displayed) Labs Reviewed  COMPREHENSIVE METABOLIC PANEL - Abnormal; Notable for the following components:      Result Value   Potassium 3.4 (*)    CO2 21 (*)    Glucose, Bld 150 (*)    Calcium 8.5 (*)    All other components within normal limits  ETHANOL - Abnormal; Notable for the following components:   Alcohol, Ethyl (B) 47 (*)    All other components within normal limits  CBC  RAPID URINE DRUG SCREEN, HOSP PERFORMED    EKG None  Radiology No results found.  Procedures Procedures (including critical care time)  Medications Ordered in ED Medications  risperiDONE (RISPERDAL) tablet 1 mg (1 mg Oral Given 11/20/19 0046)    ED Course  I have reviewed the triage vital signs and the nursing notes.  Pertinent labs & imaging results that were available during my care of the patient were reviewed by me and considered in my medical decision making (see chart for details).    MDM Rules/Calculators/A&P  33 year old male presenting to the ED with hallucinations.  This is a chronic issue for him, 24 visits in the last 6 months for similar related complaints.  Had assessment on 1221 by psychiatry and not felt to meet inpatient criteria.  He denies any changes in his hallucinations, just states "they got bad".  States he was not careful being alone.  He denies any suicidal homicidal ideation.  He has not been taking his Risperdal.  Labs were obtained, he does have EtOH on board but does not appear clinically intoxicated.  Labs are otherwise reassuring.  Patient does not appear to be responding to internal stimuli, exhibiting clear and concise thought process, does not appear to be a danger to himself or others at this time.  I do not feel he needs repeat psychiatry assessment at this time.  He  was given dose of his Risperdal and encouraged to follow-up with his psychiatrist.  Final Clinical Impression(s) / ED Diagnoses Final diagnoses:  Hallucinations    Rx / DC Orders ED Discharge Orders    None       Larene Pickett, PA-C 11/20/19 0124    Ripley Fraise, MD 11/20/19 [phone]

## 2019-11-20 NOTE — ED Notes (Signed)
Pt ambulatory at time of discharge, alert and oriented. Belongings returned

## 2019-11-22 ENCOUNTER — Encounter (HOSPITAL_COMMUNITY): Payer: Self-pay | Admitting: Emergency Medicine

## 2019-11-22 ENCOUNTER — Emergency Department (HOSPITAL_COMMUNITY)
Admission: EM | Admit: 2019-11-22 | Discharge: 2019-11-22 | Disposition: A | Discharge status: Home / Self Care | Payer: Self-pay | Attending: Emergency Medicine | Admitting: Emergency Medicine

## 2019-11-22 ENCOUNTER — Other Ambulatory Visit: Payer: Self-pay

## 2019-11-22 DIAGNOSIS — Z7689 Persons encountering health services in other specified circumstances: Secondary | ICD-10-CM | POA: Insufficient documentation

## 2019-11-22 DIAGNOSIS — R440 Auditory hallucinations: Secondary | ICD-10-CM | POA: Insufficient documentation

## 2019-11-22 DIAGNOSIS — F1210 Cannabis abuse, uncomplicated: Secondary | ICD-10-CM | POA: Insufficient documentation

## 2019-11-22 DIAGNOSIS — F17210 Nicotine dependence, cigarettes, uncomplicated: Secondary | ICD-10-CM | POA: Insufficient documentation

## 2019-11-22 MED ORDER — RISPERIDONE 3 MG PO TABS
3.0000 mg | ORAL_TABLET | Freq: Once | ORAL | Status: AC
  Administered 2019-11-22: 3 mg via ORAL
  Filled 2019-11-22: qty 1

## 2019-11-22 NOTE — ED Provider Notes (Signed)
[place] EMERGENCY DEPARTMENT Provider Note   CSN: [id number] Arrival date & time: 11/22/19  1758     History Chief Complaint  Patient presents with  . Needs Medication    Cole Barrett is a 33 y.o. male with past medical history significant for paranoid depression, homelessness, and chronic auditory hallucinations presents to the ED for medication refill.  On my exam, patient reports that he has his Risperdal, but that it is at a friend's house and he cannot get there tonight.  He reports that he should have no issue get no tomorrow to pick up his medication.  He denies any and all symptoms at night.  He has not been feeling sick recently, no fevers, chills, chest pain, shortness of breath.  He is answering questions appropriately.  While he endorses auditory hallucinations, reports that this is his baseline.  He denies any visual hallucinations, homicidal ideation, suicidal ideation, or any plans to hurt himself.  He also denies any illicit drug use or alcohol use.  Patient reports that he takes three 1 mg tablets of Risperdal at bedtime.     HPI     Past Medical History:  Diagnosis Date  . Homelessness   . Hypercholesterolemia   . Mental disorder   . Paranoid behavior ([place])   . PE (pulmonary embolism)   . Schizophrenia [facility])     Patient Active Problem List   Diagnosis Date Noted  . Acute psychosis ([place]) 08/31/2019  . Schizophrenia ([place]) 08/31/2019  . Schizoaffective disorder, bipolar type ([place]) 01/19/2019  . Alcohol abuse   . Auditory hallucinations   . Alcohol abuse with alcohol-induced mood disorder ([place]) 09/08/2016  . Homelessness 09/03/2016  . Person feigning illness 09/03/2016  . Brief psychotic disorder ([place]) 08/29/2016  . Cannabis use disorder, mild, abuse 08/29/2016  . Pulmonary emboli ([place]) 07/13/2016  . Adjustment disorder with emotional disturbance 03/12/2014  . Pulmonary embolism and infarction ([place]) 01/02/2014  . Acute  left flank pain 01/02/2014  . Pulmonary embolism, bilateral ([place]) 01/02/2014    History reviewed. No pertinent surgical history.     Family History  Problem Relation Age of Onset  . Hypertension Mother     Social History   Tobacco Use  . Smoking status: Current Every Day Smoker    Packs/day: 0.50    Types: Cigarettes  . Smokeless tobacco: Never Used  Substance Use Topics  . Alcohol use: Yes    Comment: BAC not available  . Drug use: Yes    Types: Marijuana    Comment: Last used: 2 months ago UDS NA    Home Medications Prior to Admission medications   Medication Sig Start Date End Date Taking? Authorizing Provider  risperiDONE (RISPERDAL) 1 MG tablet Take 1 tablet by mouth each morning.  Take 3 tablets by mouth at bedtime. 11/11/19   Larene Pickett, PA-C  amantadine (SYMMETREL) 100 MG capsule Take 1 capsule (100 mg total) by mouth 2 (two) times daily. Patient not taking: Reported on 11/04/2019 09/01/19 11/17/19  Mallie Darting, NP    Allergies    Haldol [haloperidol]  Review of Systems   Review of Systems  All other systems reviewed and are negative.   Physical Exam Updated Vital Signs BP 129/87   Pulse 70   Temp 98.5 F (36.9 C) (Oral)   Resp 18   SpO2 97%   Physical Exam Vitals and nursing note reviewed. Exam conducted with a chaperone present.  Constitutional:  Appearance: Normal appearance.  HENT:     Head: Normocephalic and atraumatic.  Eyes:     General: No scleral icterus.    Conjunctiva/sclera: Conjunctivae normal.  Cardiovascular:     Rate and Rhythm: Normal rate and regular rhythm.     Pulses: Normal pulses.     Heart sounds: Normal heart sounds.  Pulmonary:     Effort: Pulmonary effort is normal. No respiratory distress.     Breath sounds: Normal breath sounds.  Abdominal:     General: Abdomen is flat. There is no distension.     Palpations: Abdomen is soft.     Tenderness: There is no abdominal tenderness. There is no guarding.    Musculoskeletal:     Cervical back: Normal range of motion and neck supple.  Skin:    General: Skin is dry.  Neurological:     Mental Status: He is alert.     GCS: GCS eye subscore is 4. GCS verbal subscore is 5. GCS motor subscore is 6.  Psychiatric:        Mood and Affect: Mood normal.        Behavior: Behavior normal.        Thought Content: Thought content normal.     ED Results / Procedures / Treatments   Labs (all labs ordered are listed, but only abnormal results are displayed) Labs Reviewed - No data to display  EKG None  Radiology No results found.  Procedures Procedures (including critical care time)  Medications Ordered in ED Medications  risperiDONE (RISPERDAL) tablet 3 mg (has no administration in time range)    ED Course  I have reviewed the triage vital signs and the nursing notes.  Pertinent labs & imaging results that were available during my care of the patient were reviewed by me and considered in my medical decision making (see chart for details).    MDM Rules/Calculators/A&P                      Patient presents to the ED with no complaints and simply wants to take his 3 mg Risperdal, as prescribed.  I reviewed patient's medical record and it appears that that is his prescription as he takes 1 mg in the morning followed by 3 mg at night.  Patient appears to be answering questions appropriately and does not appear altered or impaired in any way.  He reports that his last dose was 1 mg this morning and he is not missed any other doses recently.  He adamantly denies any recent illness, fevers, chills, chest pain, difficulty breathing, abdominal pain, or any other symptoms at this time.  I do not feel as though any laboratory work-up is warranted at this time as patient's vital signs are all within normal limits and he appears to be in no acute distress.  Patient has been relaxing comfortably on exam and has been cooperative with the assessment.  Patient  has been evaluated in the ER 5 other times in the month of December with similar complaints.  Provided patient with 3 mg Risperdal.  When asked if he had a place to stay tonight as well as a ride, he replies that he is all set and that "everything is straight".  He is adamant that he will be able to obtain his prescribed medication from his friend's house tomorrow and that he will not need to return to the ED for further medication management. Discussed return precautions.  Patient voiced understanding  and is agreeable to plan.   Final Clinical Impression(s) / ED Diagnoses Final diagnoses:  Encounter for medication administration    Rx / DC Orders ED Discharge Orders    None       Elvera MariaGreen, Corayma Cashatt L, PA-C 11/22/19 2157    Jacalyn LefevreHaviland, Julie, MD 11/22/19 2300

## 2019-11-22 NOTE — ED Notes (Signed)
PT was seen walking out the front door, unsure if PT was leaving this tech was with another patient.

## 2019-11-22 NOTE — ED Triage Notes (Signed)
Patient states he could not get his Risperdal and he really needs to take it. Last had it last night. States he knows where it is but could not get to it. Denies any hallucinations or HI/SI.

## 2019-11-22 NOTE — ED Notes (Addendum)
PT called x3 for vitals no answer.

## 2019-11-22 NOTE — ED Notes (Signed)
Pt did not respond to name, RN found him asleep in waiting room.

## 2019-11-22 NOTE — Discharge Instructions (Addendum)
I have provided an attachment on adult resources here in the area of [place].  Please return to the ED or seek medical attention immediately should he develop any worsening psychiatric symptoms or if you develop any illness as we will be happy to evaluate you further.

## 2019-11-25 ENCOUNTER — Emergency Department (HOSPITAL_COMMUNITY)
Admission: EM | Admit: 2019-11-25 | Discharge: 2019-11-25 | Disposition: A | Discharge status: Home / Self Care | Payer: Self-pay | Attending: Emergency Medicine | Admitting: Emergency Medicine

## 2019-11-25 ENCOUNTER — Encounter (HOSPITAL_COMMUNITY): Payer: Self-pay

## 2019-11-25 ENCOUNTER — Other Ambulatory Visit: Payer: Self-pay

## 2019-11-25 DIAGNOSIS — F209 Schizophrenia, unspecified: Secondary | ICD-10-CM | POA: Insufficient documentation

## 2019-11-25 DIAGNOSIS — F17210 Nicotine dependence, cigarettes, uncomplicated: Secondary | ICD-10-CM | POA: Insufficient documentation

## 2019-11-25 DIAGNOSIS — R440 Auditory hallucinations: Secondary | ICD-10-CM | POA: Insufficient documentation

## 2019-11-25 NOTE — ED Notes (Signed)
Pt verbalized discharge instructions and follow up care. Alert and ambulatory. No IV.  

## 2019-11-25 NOTE — ED Triage Notes (Addendum)
Pt coming in c/o hearing voices "really bad tonight" Stated the voices are telling him they are going to hurt him.

## 2019-11-25 NOTE — ED Provider Notes (Signed)
TIME SEEN: 1:12 AM  CHIEF COMPLAINT: Auditory hallucinations  HPI: Patient is a 33 year old male with history of schizophrenia who presents to the emergency department auditory hallucinations.  States that he is hearing voices telling him that they will kill him.  He states he is not suicidal or homicidal and he is not concerned that he will harm himself or others.  He reports he went to [facility] 2 weeks ago and received refills of his medications.  He claims that he is taking them.  He has no other acute medical complaints.  States that this time he has not homeless and is living on [address]  ROS: See HPI Constitutional: no fever  Eyes: no drainage  ENT: no runny nose   Cardiovascular:  no chest pain  Resp: no SOB  GI: no vomiting GU: no dysuria Integumentary: no rash  Allergy: no hives  Musculoskeletal: no leg swelling  Neurological: no slurred speech ROS otherwise negative  PAST MEDICAL HISTORY/PAST SURGICAL HISTORY:  Past Medical History:  Diagnosis Date  . Homelessness   . Hypercholesterolemia   . Mental disorder   . Paranoid behavior ([place])   . PE (pulmonary embolism)   . Schizophrenia ([place])     MEDICATIONS:  Prior to Admission medications   Medication Sig Start Date End Date Taking? Authorizing Provider  risperiDONE (RISPERDAL) 1 MG tablet Take 1 tablet by mouth each morning.  Take 3 tablets by mouth at bedtime. 11/11/19   Larene Pickett, PA-C  amantadine (SYMMETREL) 100 MG capsule Take 1 capsule (100 mg total) by mouth 2 (two) times daily. Patient not taking: Reported on 11/04/2019 09/01/19 11/17/19  Mallie Darting, NP    ALLERGIES:  Allergies  Allergen Reactions  . Haldol [Haloperidol] Shortness Of Breath    SOCIAL HISTORY:  Social History   Tobacco Use  . Smoking status: Current Every Day Smoker    Packs/day: 0.50    Types: Cigarettes  . Smokeless tobacco: Never Used  Substance Use Topics  . Alcohol use: Yes    Comment: BAC not available     FAMILY HISTORY: Family History  Problem Relation Age of Onset  . Hypertension Mother     EXAM: BP (!) 142/74 (BP Location: Right Arm)   Pulse 68   Temp 97.6 F (36.4 C) (Oral)   Resp 18   SpO2 98%  CONSTITUTIONAL: Alert and oriented and responds appropriately to questions. Well-appearing; well-nourished HEAD: Normocephalic EYES: Conjunctivae clear, pupils appear equal, EOM appear intact ENT: normal nose; moist mucous membranes NECK: Supple, normal ROM CARD: RRR; S1 and S2 appreciated; no murmurs, no clicks, no rubs, no gallops RESP: Normal chest excursion without splinting or tachypnea; breath sounds clear and equal bilaterally; no wheezes, no rhonchi, no rales, no hypoxia or respiratory distress, speaking full sentences ABD/GI: Normal bowel sounds; non-distended; soft, non-tender, no rebound, no guarding, no peritoneal signs, no hepatosplenomegaly BACK:  The back appears normal EXT: Normal ROM in all joints; no deformity noted, no edema; no cyanosis SKIN: Normal color for age and race; warm; no rash on exposed skin NEURO: Moves all extremities equally PSYCH: Endorses auditory hallucinations but denies SI, HI.  Mood appears appropriate.  MEDICAL DECISION MAKING: Patient here with chronic hallucinations.  He states that he does not think he is going to harm himself or others.  I do not feel he has an emergent psychiatric concern.  At this is a chronic issue for him and he presents to the emergency department frequently for the same.  Has been here 26 times in the past 6 months.  States that he has refills of his medications at home.  Reports that he has a place to live currently.  Will refer him back to [facility] for outpatient treatment.  I do not feel he needs emergent psychiatric evaluation.    Stanton Kissoon was evaluated in Emergency Department on 11/25/2019 for the symptoms described in the history of present illness. He was evaluated in the context of the global  COVID-19 pandemic, which necessitated consideration that the patient might be at risk for infection with the SARS-CoV-2 virus that causes COVID-19. Institutional protocols and algorithms that pertain to the evaluation of patients at risk for COVID-19 are in a state of rapid change based on information released by regulatory bodies including the CDC and federal and state organizations. These policies and algorithms were followed during the patient's care in the ED.  Patient was seen wearing N95, face shield, gloves.    Essa Wenk, Layla Maw, DO 11/25/19 [phone]

## 2019-12-01 ENCOUNTER — Encounter (HOSPITAL_COMMUNITY): Payer: Self-pay | Admitting: Emergency Medicine

## 2019-12-01 ENCOUNTER — Emergency Department (HOSPITAL_COMMUNITY)
Admission: EM | Admit: 2019-12-01 | Discharge: 2019-12-01 | Disposition: A | Discharge status: Home / Self Care | Payer: Self-pay | Attending: Emergency Medicine | Admitting: Emergency Medicine

## 2019-12-01 ENCOUNTER — Other Ambulatory Visit: Payer: Self-pay

## 2019-12-01 DIAGNOSIS — Z590 Homelessness: Secondary | ICD-10-CM | POA: Insufficient documentation

## 2019-12-01 DIAGNOSIS — F17210 Nicotine dependence, cigarettes, uncomplicated: Secondary | ICD-10-CM | POA: Insufficient documentation

## 2019-12-01 DIAGNOSIS — R440 Auditory hallucinations: Secondary | ICD-10-CM | POA: Insufficient documentation

## 2019-12-01 LAB — CBC
HCT: 40.9 % (ref 39.0–52.0)
Hemoglobin: 13 g/dL (ref 13.0–17.0)
MCH: 28 pg (ref 26.0–34.0)
MCHC: 31.8 g/dL (ref 30.0–36.0)
MCV: 88.1 fL (ref 80.0–100.0)
Platelets: 297 10*3/uL (ref 150–400)
RBC: 4.64 MIL/uL (ref 4.22–5.81)
RDW: 12.9 % (ref 11.5–15.5)
WBC: 3.2 10*3/uL — ABNORMAL LOW (ref 4.0–10.5)
nRBC: 0 % (ref 0.0–0.2)

## 2019-12-01 LAB — ACETAMINOPHEN LEVEL: Acetaminophen (Tylenol), Serum: <10 ug/mL — ABNORMAL LOW (ref 10–30)

## 2019-12-01 LAB — COMPREHENSIVE METABOLIC PANEL
ALT: 40 U/L (ref 0–44)
AST: 27 U/L (ref 15–41)
Albumin: 3.8 g/dL (ref 3.5–5.0)
Alkaline Phosphatase: 44 U/L (ref 38–126)
Anion gap: 12 (ref 5–15)
BUN: 9 mg/dL (ref 6–20)
CO2: 23 mmol/L (ref 22–32)
Calcium: 9 mg/dL (ref 8.9–10.3)
Chloride: 104 mmol/L (ref 98–111)
Creatinine, Ser: 1.01 mg/dL (ref 0.61–1.24)
GFR calc Af Amer: >60 mL/min (ref >60)
GFR calc non Af Amer: >60 mL/min (ref >60)
Glucose, Bld: 151 mg/dL — ABNORMAL HIGH (ref 70–99)
Potassium: 4.1 mmol/L (ref 3.5–5.1)
Sodium: 139 mmol/L (ref 135–145)
Total Bilirubin: 0.3 mg/dL (ref 0.3–1.2)
Total Protein: 6.9 g/dL (ref 6.5–8.1)

## 2019-12-01 LAB — SALICYLATE LEVEL: Salicylate Lvl: <7 mg/dL — ABNORMAL LOW (ref 7.0–30.0)

## 2019-12-01 LAB — ETHANOL: Alcohol, Ethyl (B): <10 mg/dL (ref <10)

## 2019-12-01 NOTE — ED Triage Notes (Signed)
C/o hearing voices since this morning.  Denies SI/HI.

## 2019-12-01 NOTE — ED Provider Notes (Signed)
[facility] EMERGENCY DEPARTMENT Provider Note   CSN: [id number] Arrival date & time: 12/01/19  6433     History Chief Complaint  Patient presents with  . Hallucinations    Cole Barrett is a 34 y.o. male.  34 year old male with history of schizophrenia presents with complaint of baseline hallucinations, states that he hears voices telling him to hurt himself.  Patient does not feel like he is in danger, denies thoughts of wanting to hurt himself or others.  Patient is taking his medications as prescribed, does not need refills however is requesting adjustment to his medication to help with his hallucinations.  Patient has not contacted his prescriber for assistance with this.  No other complaints or concerns today.        Past Medical History:  Diagnosis Date  . Homelessness   . Hypercholesterolemia   . Mental disorder   . Paranoid behavior ([place])   . PE (pulmonary embolism)   . Schizophrenia [facility])     Patient Active Problem List   Diagnosis Date Noted  . Acute psychosis ([place]) 08/31/2019  . Schizophrenia ([place]) 08/31/2019  . Schizoaffective disorder, bipolar type ([place]) 01/19/2019  . Alcohol abuse   . Auditory hallucinations   . Alcohol abuse with alcohol-induced mood disorder ([place]) 09/08/2016  . Homelessness 09/03/2016  . Person feigning illness 09/03/2016  . Brief psychotic disorder ([place]) 08/29/2016  . Cannabis use disorder, mild, abuse 08/29/2016  . Pulmonary emboli ([place]) 07/13/2016  . Adjustment disorder with emotional disturbance 03/12/2014  . Pulmonary embolism and infarction ([place]) 01/02/2014  . Acute left flank pain 01/02/2014  . Pulmonary embolism, bilateral ([place]) 01/02/2014    History reviewed. No pertinent surgical history.     Family History  Problem Relation Age of Onset  . Hypertension Mother     Social History   Tobacco Use  . Smoking status: Current Every Day Smoker    Packs/day: 0.50    Types: Cigarettes  .  Smokeless tobacco: Never Used  Substance Use Topics  . Alcohol use: Yes    Comment: BAC not available  . Drug use: Yes    Types: Marijuana    Comment: Last used: 2 months ago UDS NA    Home Medications Prior to Admission medications   Medication Sig Start Date End Date Taking? Authorizing Provider  risperiDONE (RISPERDAL) 1 MG tablet Take 1 tablet by mouth each morning.  Take 3 tablets by mouth at bedtime. 11/11/19   Larene Pickett, PA-C  amantadine (SYMMETREL) 100 MG capsule Take 1 capsule (100 mg total) by mouth 2 (two) times daily. Patient not taking: Reported on 11/04/2019 09/01/19 11/17/19  Mallie Darting, NP    Allergies    Haldol [haloperidol]  Review of Systems   Review of Systems  Gastrointestinal: Negative for abdominal pain.  Skin: Negative for wound.  Allergic/Immunologic: Negative for immunocompromised state.  Neurological: Negative for weakness.  Psychiatric/Behavioral: Negative for confusion and suicidal ideas. The patient is not nervous/anxious.   All other systems reviewed and are negative.   Physical Exam Updated Vital Signs BP 124/76 (BP Location: Right Arm)   Pulse 79   Temp 98 F (36.7 C) (Oral)   Resp 16   SpO2 99%   Physical Exam Vitals and nursing note reviewed.  Constitutional:      General: He is not in acute distress.    Appearance: He is well-developed. He is not diaphoretic.  HENT:     Head: Normocephalic and atraumatic.  Cardiovascular:  Rate and Rhythm: Normal rate and regular rhythm.     Pulses: Normal pulses.     Heart sounds: Normal heart sounds.  Pulmonary:     Effort: Pulmonary effort is normal.     Breath sounds: Normal breath sounds.  Abdominal:     Palpations: Abdomen is soft.     Tenderness: There is no abdominal tenderness.  Skin:    General: Skin is warm and dry.     Findings: No erythema or rash.  Neurological:     Mental Status: He is alert and oriented to person, place, and time.  Psychiatric:        Mood  and Affect: Mood normal.        Speech: Speech normal.        Behavior: Behavior is not aggressive.        Thought Content: Thought content does not include homicidal or suicidal ideation.     ED Results / Procedures / Treatments   Labs (all labs ordered are listed, but only abnormal results are displayed) Labs Reviewed  COMPREHENSIVE METABOLIC PANEL - Abnormal; Notable for the following components:      Result Value   Glucose, Bld 151 (*)    All other components within normal limits  SALICYLATE LEVEL - Abnormal; Notable for the following components:   Salicylate Lvl <7.0 (*)    All other components within normal limits  ACETAMINOPHEN LEVEL - Abnormal; Notable for the following components:   Acetaminophen (Tylenol), Serum <10 (*)    All other components within normal limits  CBC - Abnormal; Notable for the following components:   WBC 3.2 (*)    All other components within normal limits  ETHANOL  RAPID URINE DRUG SCREEN, HOSP PERFORMED    EKG None  Radiology No results found.  Procedures Procedures (including critical care time)  Medications Ordered in ED Medications - No data to display  ED Course  I have reviewed the triage vital signs and the nursing notes.  Pertinent labs & imaging results that were available during my care of the patient were reviewed by me and considered in my medical decision making (see chart for details).  Clinical Course as of Nov 30 1333  Sun Dec 01, 2019  7025 34 year old male with history of schizophrenia presents with request for medication adjustment.  Patient expresses baseline auditory hallucinations telling him to harm himself.  Patient denies plans to harm himself or wanting to harm himself or others.  Multiple ER visits with similar complaint.  On exam patient is well-appearing, he is calm, he has a steady gait, was found in the bathroom masturbating with the door open.  Patient is not felt to be a threat to self or others, is advised  to continue taking his medications as prescribed, states he does not need this refilled today.  Patient was advised to follow-up with his prescriber.   [LM]    Clinical Course User Index [LM] Alden Hipp   MDM Rules/Calculators/A&P                      Final Clinical Impression(s) / ED Diagnoses Final diagnoses:  Auditory hallucination    Rx / DC Orders ED Discharge Orders    None       Jeannie Fend, PA-C 12/01/19 1335    Virgina Norfolk, DO 12/01/19 1510

## 2019-12-01 NOTE — Discharge Instructions (Addendum)
Follow-up with your care team for medication adjustment.

## 2019-12-06 ENCOUNTER — Emergency Department (HOSPITAL_COMMUNITY)
Admission: EM | Admit: 2019-12-06 | Discharge: 2019-12-06 | Disposition: A | Discharge status: Home / Self Care | Payer: Self-pay | Attending: Emergency Medicine | Admitting: Emergency Medicine

## 2019-12-06 ENCOUNTER — Other Ambulatory Visit: Payer: Self-pay

## 2019-12-06 ENCOUNTER — Encounter (HOSPITAL_COMMUNITY): Payer: Self-pay

## 2019-12-06 DIAGNOSIS — Z79899 Other long term (current) drug therapy: Secondary | ICD-10-CM | POA: Insufficient documentation

## 2019-12-06 DIAGNOSIS — F17210 Nicotine dependence, cigarettes, uncomplicated: Secondary | ICD-10-CM | POA: Insufficient documentation

## 2019-12-06 DIAGNOSIS — R440 Auditory hallucinations: Secondary | ICD-10-CM | POA: Insufficient documentation

## 2019-12-06 MED ORDER — RISPERIDONE 2 MG PO TABS
3.0000 mg | ORAL_TABLET | Freq: Once | ORAL | Status: AC
  Administered 2019-12-06: 3 mg via ORAL
  Filled 2019-12-06: qty 1

## 2019-12-06 NOTE — ED Notes (Signed)
Pt called for triage. He states, "give me a couple minutes, I'm eating."

## 2019-12-06 NOTE — Discharge Instructions (Signed)
Please contact [facility] if you feel your symptoms become uncontrolled with your regular medications.

## 2019-12-06 NOTE — ED Triage Notes (Signed)
Pt reports that he is hearing voices more than normally. Denies any commands from them. He denies SI/HI. States that he has been compliant with his meds. Calm and cooperative. He denies any pain or physical complaints.

## 2019-12-06 NOTE — ED Provider Notes (Signed)
[facility]-EMERGENCY DEPT Provider Note   CSN: [id number] Arrival date & time: 12/06/19  0245     History Chief Complaint  Patient presents with  . Hallucinations    Cole Barrett is a 34 y.o. male.  Patient with history of schizophrenia, chronic auditory hallucinations presents with complaint of increased auditory hallucinations. He denies command voices, SI, HI. He takes Risperdal 1 mg q am and 3 mg q pm. He reports he did not take his evening dose. He states his symptoms are usually well controlled with his medicine. There is no clear answer as to why he did not take his evening dose but denies that he is out, and states he feels if he had his dose he would be fine.   The history is provided by the patient. No language interpreter was used.       Past Medical History:  Diagnosis Date  . Homelessness   . Hypercholesterolemia   . Mental disorder   . Paranoid behavior (H[facility])   . PE (pulmonary embolism)   . Schizophrenia [facility])     Patient Active Problem List   Diagnosis Date Noted  . Acute psychosis (H[facility]) 08/31/2019  . Schizophrenia (H[facility]) 08/31/2019  . Schizoaffective disorder, bipolar type (HCC) 01/19/2019  . Alcohol abuse   . Auditory hallucinations   . Alcohol abuse with alcohol-induced mood disorder (HCC) 09/08/2016  . Homelessness 09/03/2016  . Person feigning illness 09/03/2016  . 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 Cannabis use disorder, mild, abuse 08/29/2016  . Pulmonary emboli (HCC) 07/13/2016  . Adjustment disorder with emotional disturbance 03/12/2014  . 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 Acute left flank pain 01/02/2014  . Pulmonary embolism, bilateral (HCC) 01/02/2014    History reviewed. No pertinent surgical history.     Family History  Problem Relation Age of Onset  . Hypertension Mother     Social History   Tobacco Use  . Smoking status: Current Every Day Smoker    Packs/day:  0.50    Types: Cigarettes  . Smokeless tobacco: Never Used  Substance Use Topics  . Alcohol use: Yes    Comment: BAC not available  . Drug use: Yes    Types: Marijuana    Comment: Last used: 2 months ago UDS NA    Home Medications Prior to Admission medications   Medication Sig Start Date End Date Taking? Authorizing Provider  risperiDONE (RISPERDAL) 1 MG tablet Take 1 tablet by mouth each morning.  Take 3 tablets by mouth at bedtime. 11/11/19   Garlon Hatchet, PA-C  amantadine (SYMMETREL) 100 MG capsule Take 1 capsule (100 mg total) by mouth 2 (two) times daily. Patient not taking: Reported on 11/04/2019 09/01/19 11/17/19  [name], NP    Allergies    Haldol [haloperidol]  Review of Systems   Review of Systems  Constitutional: Negative for chills and fever.  HENT: Negative.   Respiratory: Negative.   Cardiovascular: Negative.   Gastrointestinal: Negative.   Musculoskeletal: Negative.   Skin: Negative.   Neurological: Negative.   Psychiatric/Behavioral: Positive for hallucinations. Negative for suicidal ideas.    Physical Exam Updated Vital Signs BP (!) 146/79 (BP Location: Right Arm)   Pulse (!) 106   Temp 98.6 F (37 C)   Resp 16   SpO2 100%   Physical Exam Constitutional:      Appearance: He is well-developed.  Pulmonary:     Effort: Pulmonary effort is normal.  Musculoskeletal:  General: Normal range of motion.     Cervical back: Normal range of motion.  Skin:    General: Skin is warm and dry.  Neurological:     Mental Status: He is alert and oriented to person, place, and time.  Psychiatric:        Attention and Perception: He perceives auditory hallucinations.        Mood and Affect: Mood normal.        Speech: Speech normal.        Behavior: Behavior is cooperative.        Thought Content: Thought content does not include homicidal or suicidal ideation.     Comments: Calm and cooperative.      ED Results / Procedures / Treatments    Labs (all labs ordered are listed, but only abnormal results are displayed) Labs Reviewed - No data to display  EKG None  Radiology No results found.  Procedures Procedures (including critical care time)  Medications Ordered in ED Medications  risperiDONE (RISPERDAL) tablet 3 mg (has no administration in time range)    ED Course  I have reviewed the triage vital signs and the nursing notes.  Pertinent labs & imaging results that were available during my care of the patient were reviewed by me and considered in my medical decision making (see chart for details).    MDM Rules/Calculators/A&P                      Patient well known to the ED presents with increased symptoms of chronic auditory hallucinations. States he missed his evening dose. No new symptoms.   Discussed whether there was anything else he needed. He confirms he has a house, that he is not homeless. He continues to deny SI, HI. Do not feel he is acutely psychotic or in need of inpatient stabilization.   The patient was provided his night time dose of regular meds and is discharged home.  Final Clinical Impression(s) / ED Diagnoses Final diagnoses:  Continuous auditory hallucinations    Rx / DC Orders ED Discharge Orders    None       Charlann Lange, PA-C 12/06/19 0324    Fatima Blank, MD 12/07/19 1310

## 2019-12-07 ENCOUNTER — Other Ambulatory Visit: Payer: Self-pay

## 2019-12-07 ENCOUNTER — Emergency Department (HOSPITAL_COMMUNITY)
Admission: EM | Admit: 2019-12-07 | Discharge: 2019-12-07 | Disposition: A | Discharge status: Home / Self Care | Payer: Self-pay | Attending: Emergency Medicine | Admitting: Emergency Medicine

## 2019-12-07 DIAGNOSIS — F209 Schizophrenia, unspecified: Secondary | ICD-10-CM | POA: Insufficient documentation

## 2019-12-07 DIAGNOSIS — Z79899 Other long term (current) drug therapy: Secondary | ICD-10-CM | POA: Insufficient documentation

## 2019-12-07 DIAGNOSIS — F17210 Nicotine dependence, cigarettes, uncomplicated: Secondary | ICD-10-CM | POA: Insufficient documentation

## 2019-12-07 MED ORDER — RISPERIDONE 3 MG PO TABS
3.0000 mg | ORAL_TABLET | Freq: Once | ORAL | Status: AC
  Administered 2019-12-07: 10:00:00 3 mg via ORAL
  Filled 2019-12-07: qty 1

## 2019-12-07 NOTE — ED Provider Notes (Signed)
[facility] EMERGENCY DEPARTMENT Provider Note   CSN: [id number] Arrival date & time: 12/07/19  2703     History Chief Complaint  Patient presents with  . Medication Refill    Cole Barrett is a 34 y.o. male with past medical history significant for paranoid depression, homelessness, and chronic auditory hallucinations presents to the ED for medication management.  Patient reports that he was in the ED Thursday evening to receive his 3 mg Risperdal dose and failed to reach out to [facility] yesterday for medication refill.  For that reason, he was unable to take his 3 mg Risperdal dose last evening and is requesting it today.  He reports that if he receives a 3 mg dose this morning in the ED he "should be straight" until he can follow-up with [facility] on Monday 12/09/2019.  His typical regimen is Risperdal 1 mg p.o. morning and 3 mg p.o. nightly.  This is the patient's 12th ED encounter for medication management since 11/02/2019.  Currently he is denying any auditory or visual hallucinations, intrusive thoughts, SI or HI, paranoia, or any medical complaints.  He simply wants to ensure that he is receiving is appropriate load of Risperdal.  He reports that he currently has his own apartment.  HPI     Past Medical History:  Diagnosis Date  . Homelessness   . Hypercholesterolemia   . Mental disorder   . Paranoid behavior ([place])   . PE (pulmonary embolism)   . Schizophrenia [facility])     Patient Active Problem List   Diagnosis Date Noted  . Acute psychosis ([place]) 08/31/2019  . Schizophrenia ([place]) 08/31/2019  . Schizoaffective disorder, bipolar type ([place]) 01/19/2019  . Alcohol abuse   . Auditory hallucinations   . Alcohol abuse with alcohol-induced mood disorder ([place]) 09/08/2016  . Homelessness 09/03/2016  . Person feigning illness 09/03/2016  . Brief psychotic disorder ([place]) 08/29/2016  . Cannabis use disorder, mild, abuse 08/29/2016  . Pulmonary emboli ([place])  07/13/2016  . Adjustment disorder with emotional disturbance 03/12/2014  . Pulmonary embolism and infarction ([place]) 01/02/2014  . Acute left flank pain 01/02/2014  . Pulmonary embolism, bilateral ([place]) 01/02/2014    No past surgical history on file.     Family History  Problem Relation Age of Onset  . Hypertension Mother     Social History   Tobacco Use  . Smoking status: Current Every Day Smoker    Packs/day: 0.50    Types: Cigarettes  . Smokeless tobacco: Never Used  Substance Use Topics  . Alcohol use: Yes    Comment: BAC not available  . Drug use: Yes    Types: Marijuana    Comment: Last used: 2 months ago UDS NA    Home Medications Prior to Admission medications   Medication Sig Start Date End Date Taking? Authorizing Provider  risperiDONE (RISPERDAL) 1 MG tablet Take 1 tablet by mouth each morning.  Take 3 tablets by mouth at bedtime. 11/11/19   Larene Pickett, PA-C  amantadine (SYMMETREL) 100 MG capsule Take 1 capsule (100 mg total) by mouth 2 (two) times daily. Patient not taking: Reported on 11/04/2019 09/01/19 11/17/19  Mallie Darting, NP    Allergies    Haldol [haloperidol]  Review of Systems   Review of Systems  All other systems reviewed and are negative.   Physical Exam Updated Vital Signs BP 116/76 (BP Location: Right Arm)   Pulse 84   Temp 98.2 F (36.8 C) (Oral)  Resp 19   Ht 5\' 10"  (1.778 m)   Wt 99.8 kg   SpO2 95%   BMI 31.57 kg/m   Physical Exam Vitals and nursing note reviewed. Exam conducted with a chaperone present.  Constitutional:      Appearance: Normal appearance.  HENT:     Head: Normocephalic and atraumatic.  Eyes:     General: No scleral icterus.    Conjunctiva/sclera: Conjunctivae normal.  Cardiovascular:     Rate and Rhythm: Normal rate and regular rhythm.     Pulses: Normal pulses.     Heart sounds: Normal heart sounds.  Pulmonary:     Effort: Pulmonary effort is normal.     Breath sounds: Normal breath  sounds.  Abdominal:     General: Abdomen is flat. There is no distension.     Palpations: Abdomen is soft.     Tenderness: There is no abdominal tenderness. There is no guarding.  Skin:    General: Skin is dry.  Neurological:     Mental Status: He is alert and oriented to person, place, and time.     GCS: GCS eye subscore is 4. GCS verbal subscore is 5. GCS motor subscore is 6.  Psychiatric:        Mood and Affect: Mood normal.        Behavior: Behavior normal.        Thought Content: Thought content normal.     ED Results / Procedures / Treatments   Labs (all labs ordered are listed, but only abnormal results are displayed) Labs Reviewed - No data to display  EKG None  Radiology No results found.  Procedures Procedures (including critical care time)  Medications Ordered in ED Medications  risperiDONE (RISPERDAL) tablet 3 mg (has no administration in time range)    ED Course  I have reviewed the triage vital signs and the nursing notes.  Pertinent labs & imaging results that were available during my care of the patient were reviewed by me and considered in my medical decision making (see chart for details).    MDM Rules/Calculators/A&P                      Patient is denying any active AVH, SI, HI, paranoia, or other concerning psychiatric symptoms at this time.  Do not feel as though he is acutely psychotic or in need of inpatient stabilization.  Patient also denies any and all medical complaints and his vital signs are all within normal limits.  Patient is adamant that after receiving today's 3 mg Risperdal dose he will be able to "be straight" until following up with [facility] outpatient on Monday.  Offered prescription, but patient declined and instead reports that he will have them refilled on Monday.  Given his failure to follow-up with [facility] yesterday after presenting to the ED Thursday evening for similar complaints, made a concerted effort to emphasize the  importance of his outpatient follow-up and hope that he will not return to the ED for ongoing medication management.   Final Clinical Impression(s) / ED Diagnoses Final diagnoses:  Medication management    Rx / DC Orders ED Discharge Orders    None       Friday, PA-C 12/07/19 02/04/20    3419, MD 12/07/19 [phone]

## 2019-12-07 NOTE — ED Notes (Signed)
Pt. In Bathroom unable to discharge

## 2019-12-07 NOTE — Discharge Instructions (Addendum)
Please be sure to follow-up with [facility] as soon as possible so that you can have your medications refilled.  Please strongly consider getting established [facility] for [facility] evaluation and management.  Return to the ED or seek medical attention for any new or worsening symptoms.

## 2019-12-07 NOTE — ED Triage Notes (Signed)
Pt requesting refill on Risperdal; reports he lost his medications today. Denies SI/HI or worsening hallucinations

## 2019-12-09 ENCOUNTER — Other Ambulatory Visit: Payer: Self-pay

## 2019-12-09 ENCOUNTER — Emergency Department (HOSPITAL_COMMUNITY)
Admission: EM | Admit: 2019-12-09 | Discharge: 2019-12-10 | Disposition: A | Discharge status: Home / Self Care | Payer: Self-pay | Attending: Emergency Medicine | Admitting: Emergency Medicine

## 2019-12-09 ENCOUNTER — Encounter (HOSPITAL_COMMUNITY): Payer: Self-pay | Admitting: Emergency Medicine

## 2019-12-09 DIAGNOSIS — Z79899 Other long term (current) drug therapy: Secondary | ICD-10-CM | POA: Insufficient documentation

## 2019-12-09 DIAGNOSIS — R443 Hallucinations, unspecified: Secondary | ICD-10-CM

## 2019-12-09 DIAGNOSIS — F17210 Nicotine dependence, cigarettes, uncomplicated: Secondary | ICD-10-CM | POA: Insufficient documentation

## 2019-12-09 DIAGNOSIS — R462 Strange and inexplicable behavior: Secondary | ICD-10-CM | POA: Insufficient documentation

## 2019-12-09 DIAGNOSIS — R440 Auditory hallucinations: Secondary | ICD-10-CM | POA: Insufficient documentation

## 2019-12-09 DIAGNOSIS — F1210 Cannabis abuse, uncomplicated: Secondary | ICD-10-CM | POA: Insufficient documentation

## 2019-12-09 LAB — CBC
HCT: 38.8 % — ABNORMAL LOW (ref 39.0–52.0)
Hemoglobin: 12.5 g/dL — ABNORMAL LOW (ref 13.0–17.0)
MCH: 28.2 pg (ref 26.0–34.0)
MCHC: 32.2 g/dL (ref 30.0–36.0)
MCV: 87.4 fL (ref 80.0–100.0)
Platelets: 317 10*3/uL (ref 150–400)
RBC: 4.44 MIL/uL (ref 4.22–5.81)
RDW: 12.7 % (ref 11.5–15.5)
WBC: 4.2 10*3/uL (ref 4.0–10.5)
nRBC: 0 % (ref 0.0–0.2)

## 2019-12-09 MED ORDER — OLANZAPINE 2.5 MG PO TABS
2.5000 mg | ORAL_TABLET | Freq: Every day | ORAL | Status: DC
  Administered 2019-12-10: 2.5 mg via ORAL
  Filled 2019-12-09 (×2): qty 1

## 2019-12-09 MED ORDER — OLANZAPINE 2.5 MG PO TABS: 2.5000 mg | ORAL_TABLET | Freq: Every day | ORAL | 0 refills | Status: DC

## 2019-12-09 NOTE — ED Triage Notes (Addendum)
Patient reports auditory hallucinations this week , denies suicidal or homicidal ideation , respirations unlabored/denies pain , calm at triage . Personal belongings bagged/labelled and stored ( not inventoried) at locker#4 [place].

## 2019-12-10 LAB — COMPREHENSIVE METABOLIC PANEL
ALT: 40 U/L (ref 0–44)
AST: 27 U/L (ref 15–41)
Albumin: 3.8 g/dL (ref 3.5–5.0)
Alkaline Phosphatase: 40 U/L (ref 38–126)
Anion gap: 12 (ref 5–15)
BUN: 13 mg/dL (ref 6–20)
CO2: 22 mmol/L (ref 22–32)
Calcium: 8.9 mg/dL (ref 8.9–10.3)
Chloride: 102 mmol/L (ref 98–111)
Creatinine, Ser: 1.01 mg/dL (ref 0.61–1.24)
GFR calc Af Amer: >60 mL/min (ref >60)
GFR calc non Af Amer: >60 mL/min (ref >60)
Glucose, Bld: 116 mg/dL — ABNORMAL HIGH (ref 70–99)
Potassium: 3.4 mmol/L — ABNORMAL LOW (ref 3.5–5.1)
Sodium: 136 mmol/L (ref 135–145)
Total Bilirubin: 0.5 mg/dL (ref 0.3–1.2)
Total Protein: 6.7 g/dL (ref 6.5–8.1)

## 2019-12-10 LAB — ACETAMINOPHEN LEVEL: Acetaminophen (Tylenol), Serum: <10 ug/mL — ABNORMAL LOW (ref 10–30)

## 2019-12-10 LAB — SALICYLATE LEVEL: Salicylate Lvl: <7 mg/dL — ABNORMAL LOW (ref 7.0–30.0)

## 2019-12-10 LAB — ETHANOL: Alcohol, Ethyl (B): 18 mg/dL — ABNORMAL HIGH (ref <10)

## 2019-12-10 NOTE — ED Notes (Signed)
All belongings given back to patient; waiting for zyprexa prior to discharge

## 2019-12-10 NOTE — ED Provider Notes (Signed)
[facility] EMERGENCY DEPARTMENT Provider Note   CSN: [id number] Arrival date & time: 12/09/19  2238     History Chief Complaint  Patient presents with  . Hallucinations    Auditory    Cole Barrett is a 34 y.o. male.  The history is provided by the patient.  Mental Health Problem Presenting symptoms: bizarre behavior and hallucinations   Presenting symptoms: no homicidal ideas and no suicidal thoughts   Degree of incapacity (severity):  Mild Onset quality:  Gradual Timing:  Constant Progression:  Waxing and waning Chronicity:  Chronic Context: alcohol use   Context: not noncompliant   Treatment compliance:  All of the time Time since last psychoactive medication taken:  12 hours Relieved by:  None tried Ineffective treatments:  None tried Associated symptoms: no abdominal pain        Past Medical History:  Diagnosis Date  . Homelessness   . Hypercholesterolemia   . Mental disorder   . Paranoid behavior ([place])   . PE (pulmonary embolism)   . Schizophrenia [facility])     Patient Active Problem List   Diagnosis Date Noted  . Acute psychosis ([place]) 08/31/2019  . Schizophrenia ([place]) 08/31/2019  . Schizoaffective disorder, bipolar type ([place]) 01/19/2019  . Alcohol abuse   . Auditory hallucinations   . Alcohol abuse with alcohol-induced mood disorder ([place]) 09/08/2016  . Homelessness 09/03/2016  . Person feigning illness 09/03/2016  . Brief psychotic disorder ([place]) 08/29/2016  . Cannabis use disorder, mild, abuse 08/29/2016  . Pulmonary emboli ([place]) 07/13/2016  . Adjustment disorder with emotional disturbance 03/12/2014  . Pulmonary embolism and infarction ([place]) 01/02/2014  . Acute left flank pain 01/02/2014  . Pulmonary embolism, bilateral ([place]) 01/02/2014    History reviewed. No pertinent surgical history.     Family History  Problem Relation Age of Onset  . Hypertension Mother     Social History   Tobacco Use  . Smoking  status: Current Every Day Smoker    Packs/day: 0.50    Types: Cigarettes  . Smokeless tobacco: Never Used  Substance Use Topics  . Alcohol use: Yes    Comment: BAC not available  . Drug use: Yes    Types: Marijuana    Comment: Last used: 2 months ago UDS NA    Home Medications Prior to Admission medications   Medication Sig Start Date End Date Taking? Authorizing Provider  OLANZapine (ZYPREXA) 2.5 MG tablet Take 1 tablet (2.5 mg total) by mouth at bedtime. 12/09/19   Edyth Glomb, Corene Cornea, MD  risperiDONE (RISPERDAL) 1 MG tablet Take 1 tablet by mouth each morning.  Take 3 tablets by mouth at bedtime. 11/11/19   Larene Pickett, PA-C  amantadine (SYMMETREL) 100 MG capsule Take 1 capsule (100 mg total) by mouth 2 (two) times daily. Patient not taking: Reported on 11/04/2019 09/01/19 11/17/19  Mallie Darting, NP    Allergies    Haldol [haloperidol]  Review of Systems   Review of Systems  Gastrointestinal: Negative for abdominal pain.  Psychiatric/Behavioral: Positive for hallucinations. Negative for homicidal ideas and suicidal ideas.  All other systems reviewed and are negative.   Physical Exam Updated Vital Signs BP 124/69 (BP Location: Right Wrist)   Pulse 63   Temp 98.8 F (37.1 C) (Oral)   Resp 17   SpO2 98%   Physical Exam Vitals and nursing note reviewed.  Constitutional:      Appearance: He is well-developed.  HENT:     Head: Normocephalic and  atraumatic.     Mouth/Throat:     Mouth: Mucous membranes are dry.     Pharynx: Oropharynx is clear.  Cardiovascular:     Rate and Rhythm: Normal rate.     Heart sounds: No murmur.  Pulmonary:     Effort: Pulmonary effort is normal. No respiratory distress.  Abdominal:     General: There is no distension.  Musculoskeletal:        General: No swelling. Normal range of motion.     Cervical back: Normal range of motion.  Skin:    General: Skin is warm and dry.  Neurological:     General: No focal deficit present.      Mental Status: He is alert.     ED Results / Procedures / Treatments   Labs (all labs ordered are listed, but only abnormal results are displayed) Labs Reviewed  COMPREHENSIVE METABOLIC PANEL - Abnormal; Notable for the following components:      Result Value   Potassium 3.4 (*)    Glucose, Bld 116 (*)    All other components within normal limits  ETHANOL - Abnormal; Notable for the following components:   Alcohol, Ethyl (B) 18 (*)    All other components within normal limits  SALICYLATE LEVEL - Abnormal; Notable for the following components:   Salicylate Lvl <7.0 (*)    All other components within normal limits  ACETAMINOPHEN LEVEL - Abnormal; Notable for the following components:   Acetaminophen (Tylenol), Serum <10 (*)    All other components within normal limits  CBC - Abnormal; Notable for the following components:   Hemoglobin 12.5 (*)    HCT 38.8 (*)    All other components within normal limits  RAPID URINE DRUG SCREEN, HOSP PERFORMED    EKG None  Radiology No results found.  Procedures Procedures (including critical care time)  Medications Ordered in ED Medications  OLANZapine (ZYPREXA) tablet 2.5 mg (2.5 mg Oral Given 12/10/19 0149)    ED Course  I have reviewed the triage vital signs and the nursing notes.  Pertinent labs & imaging results that were available during my care of the patient were reviewed by me and considered in my medical decision making (see chart for details).    MDM Rules/Calculators/A&P                      Here with appears/sounds like stable chronic hallucinations. Will offer zyprexa in lieu of follow up at [facility]. No si/hi or obvious threat to self or others at this time. No medical complaints.   Final Clinical Impression(s) / ED Diagnoses Final diagnoses:  Hallucinations    Rx / DC Orders ED Discharge Orders         Ordered    OLANZapine (ZYPREXA) 2.5 MG tablet  Daily at bedtime     12/09/19 2354           Zoriah Pulice,  Barbara Cower, MD 12/11/19 0745

## 2019-12-21 ENCOUNTER — Other Ambulatory Visit: Payer: Self-pay

## 2019-12-21 ENCOUNTER — Encounter (HOSPITAL_COMMUNITY): Payer: Self-pay | Admitting: *Deleted

## 2019-12-21 ENCOUNTER — Emergency Department (HOSPITAL_COMMUNITY)
Admission: EM | Admit: 2019-12-21 | Discharge: 2019-12-22 | Disposition: A | Discharge status: Home / Self Care | Payer: Self-pay | Attending: Emergency Medicine | Admitting: Emergency Medicine

## 2019-12-21 DIAGNOSIS — R440 Auditory hallucinations: Secondary | ICD-10-CM | POA: Insufficient documentation

## 2019-12-21 DIAGNOSIS — Z79899 Other long term (current) drug therapy: Secondary | ICD-10-CM | POA: Insufficient documentation

## 2019-12-21 DIAGNOSIS — F17210 Nicotine dependence, cigarettes, uncomplicated: Secondary | ICD-10-CM | POA: Insufficient documentation

## 2019-12-21 DIAGNOSIS — Z590 Homelessness: Secondary | ICD-10-CM | POA: Insufficient documentation

## 2019-12-21 LAB — SALICYLATE LEVEL: Salicylate Lvl: <7 mg/dL — ABNORMAL LOW (ref 7.0–30.0)

## 2019-12-21 LAB — COMPREHENSIVE METABOLIC PANEL
ALT: 36 U/L (ref 0–44)
AST: 28 U/L (ref 15–41)
Albumin: 3.6 g/dL (ref 3.5–5.0)
Alkaline Phosphatase: 45 U/L (ref 38–126)
Anion gap: 10 (ref 5–15)
BUN: 15 mg/dL (ref 6–20)
CO2: 24 mmol/L (ref 22–32)
Calcium: 8.7 mg/dL — ABNORMAL LOW (ref 8.9–10.3)
Chloride: 104 mmol/L (ref 98–111)
Creatinine, Ser: 1.07 mg/dL (ref 0.61–1.24)
GFR calc Af Amer: >60 mL/min (ref >60)
GFR calc non Af Amer: >60 mL/min (ref >60)
Glucose, Bld: 103 mg/dL — ABNORMAL HIGH (ref 70–99)
Potassium: 3.8 mmol/L (ref 3.5–5.1)
Sodium: 138 mmol/L (ref 135–145)
Total Bilirubin: 0.6 mg/dL (ref 0.3–1.2)
Total Protein: 6.4 g/dL — ABNORMAL LOW (ref 6.5–8.1)

## 2019-12-21 LAB — ACETAMINOPHEN LEVEL: Acetaminophen (Tylenol), Serum: <10 ug/mL — ABNORMAL LOW (ref 10–30)

## 2019-12-21 LAB — CBC
HCT: 39.8 % (ref 39.0–52.0)
Hemoglobin: 12.9 g/dL — ABNORMAL LOW (ref 13.0–17.0)
MCH: 28.1 pg (ref 26.0–34.0)
MCHC: 32.4 g/dL (ref 30.0–36.0)
MCV: 86.7 fL (ref 80.0–100.0)
Platelets: 311 10*3/uL (ref 150–400)
RBC: 4.59 MIL/uL (ref 4.22–5.81)
RDW: 12.5 % (ref 11.5–15.5)
WBC: 4.2 10*3/uL (ref 4.0–10.5)
nRBC: 0 % (ref 0.0–0.2)

## 2019-12-21 LAB — ETHANOL: Alcohol, Ethyl (B): <10 mg/dL (ref <10)

## 2019-12-21 MED ORDER — RISPERIDONE 1 MG PO TABS
1.0000 mg | ORAL_TABLET | Freq: Once | ORAL | Status: DC
  Filled 2019-12-21: qty 1

## 2019-12-21 NOTE — ED Triage Notes (Signed)
The pt reports that he has been hearing voices for 2 years.  He denies suicidal or homicidal thoughts

## 2019-12-21 NOTE — ED Triage Notes (Signed)
The pt frequents the eds  ?? homeless

## 2019-12-21 NOTE — ED Provider Notes (Signed)
[facility] EMERGENCY DEPARTMENT Provider Note   CSN: [id number] Arrival date & time: 12/21/19  2232     History Chief Complaint  Patient presents with  . Psychiatric Evaluation    Cole Barrett is a 34 y.o. male.  Patient presents to the emergency department with complaints of hearing voices.  Patient has a history of schizophrenia with chronic auditory hallucinations.  He reports that he is taking his Risperdal as prescribed but is still hearing voices.  Patient is not homicidal or suicidal.        Past Medical History:  Diagnosis Date  . Homelessness   . Hypercholesterolemia   . Mental disorder   . Paranoid behavior ([place])   . PE (pulmonary embolism)   . Schizophrenia [facility])     Patient Active Problem List   Diagnosis Date Noted  . Acute psychosis ([place]) 08/31/2019  . Schizophrenia ([place]) 08/31/2019  . Schizoaffective disorder, bipolar type ([place]) 01/19/2019  . Alcohol abuse   . Auditory hallucinations   . Alcohol abuse with alcohol-induced mood disorder ([place]) 09/08/2016  . Homelessness 09/03/2016  . Person feigning illness 09/03/2016  . Brief psychotic disorder ([place]) 08/29/2016  . Cannabis use disorder, mild, abuse 08/29/2016  . Pulmonary emboli ([place]) 07/13/2016  . Adjustment disorder with emotional disturbance 03/12/2014  . Pulmonary embolism and infarction ([place]) 01/02/2014  . Acute left flank pain 01/02/2014  . Pulmonary embolism, bilateral ([place]) 01/02/2014    History reviewed. No pertinent surgical history.     Family History  Problem Relation Age of Onset  . Hypertension Mother     Social History   Tobacco Use  . Smoking status: Current Every Day Smoker    Packs/day: 0.50    Types: Cigarettes  . Smokeless tobacco: Never Used  Substance Use Topics  . Alcohol use: Yes    Comment: BAC not available  . Drug use: Yes    Types: Marijuana    Comment: Last used: 2 months ago UDS NA    Home Medications Prior to  Admission medications   Medication Sig Start Date End Date Taking? Authorizing Provider  OLANZapine (ZYPREXA) 2.5 MG tablet Take 1 tablet (2.5 mg total) by mouth at bedtime. 12/09/19   Mesner, Corene Cornea, MD  risperiDONE (RISPERDAL) 1 MG tablet Take 1 tablet by mouth each morning.  Take 3 tablets by mouth at bedtime. 11/11/19   Larene Pickett, PA-C  amantadine (SYMMETREL) 100 MG capsule Take 1 capsule (100 mg total) by mouth 2 (two) times daily. Patient not taking: Reported on 11/04/2019 09/01/19 11/17/19  Mallie Darting, NP    Allergies    Haldol [haloperidol]  Review of Systems   Review of Systems  Psychiatric/Behavioral: Positive for hallucinations.  All other systems reviewed and are negative.   Physical Exam Updated Vital Signs BP 135/86 (BP Location: Left Arm)   Pulse 77   Temp 97.6 F (36.4 C) (Oral)   Resp 16   Ht 5\' 10"  (1.778 m)   Wt 99.8 kg   SpO2 99%   BMI 31.57 kg/m   Physical Exam Vitals and nursing note reviewed.  Constitutional:      General: He is not in acute distress.    Appearance: Normal appearance. He is well-developed.  HENT:     Head: Normocephalic and atraumatic.     Right Ear: Hearing normal.     Left Ear: Hearing normal.     Nose: Nose normal.  Eyes:     Conjunctiva/sclera: Conjunctivae normal.  Pupils: Pupils are equal, round, and reactive to light.  Cardiovascular:     Rate and Rhythm: Regular rhythm.     Heart sounds: S1 normal and S2 normal. No murmur. No friction rub. No gallop.   Pulmonary:     Effort: Pulmonary effort is normal. No respiratory distress.     Breath sounds: Normal breath sounds.  Chest:     Chest wall: No tenderness.  Abdominal:     General: Bowel sounds are normal.     Palpations: Abdomen is soft.     Tenderness: There is no abdominal tenderness. There is no guarding or rebound. Negative signs include Murphy's sign and McBurney's sign.     Hernia: No hernia is present.  Musculoskeletal:        General: Normal  range of motion.     Cervical back: Normal range of motion and neck supple.  Skin:    General: Skin is warm and dry.     Findings: No rash.  Neurological:     Mental Status: He is alert and oriented to person, place, and time.     GCS: GCS eye subscore is 4. GCS verbal subscore is 5. GCS motor subscore is 6.     Cranial Nerves: No cranial nerve deficit.     Sensory: No sensory deficit.     Coordination: Coordination normal.  Psychiatric:        Mood and Affect: Mood normal.        Speech: Speech normal.        Behavior: Behavior normal.        Thought Content: Thought content normal.     ED Results / Procedures / Treatments   Labs (all labs ordered are listed, but only abnormal results are displayed) Labs Reviewed  CBC - Abnormal; Notable for the following components:      Result Value   Hemoglobin 12.9 (*)    All other components within normal limits  COMPREHENSIVE METABOLIC PANEL  ETHANOL  SALICYLATE LEVEL  ACETAMINOPHEN LEVEL  RAPID URINE DRUG SCREEN, HOSP PERFORMED    EKG None  Radiology No results found.  Procedures Procedures (including critical care time)  Medications Ordered in ED Medications - No data to display  ED Course  I have reviewed the triage vital signs and the nursing notes.  Pertinent labs & imaging results that were available during my care of the patient were reviewed by me and considered in my medical decision making (see chart for details).    MDM Rules/Calculators/A&P                      Patient presents once again stating that he is hearing voices.  Patient has been seen numerous times in the ED with similar complaints in the past.  He has been evaluated by behavioral health in the past and has not been deemed a candidate for inpatient treatment.  Today's presentation is no different than previous.  He likely is homeless and frequents the ED secondary to his homelessness as well.  He does not appear to be a danger to himself at this  time and does not require psychiatric evaluation.  Final Clinical Impression(s) / ED Diagnoses Final diagnoses:  Auditory hallucination    Rx / DC Orders ED Discharge Orders    None       Tayven Renteria, Canary Brim, MD 12/21/19 2339

## 2019-12-22 NOTE — ED Notes (Signed)
Breakfast ordered 

## 2019-12-22 NOTE — ED Notes (Signed)
Not si or hi

## 2019-12-22 NOTE — ED Notes (Signed)
Pt snoring loudly all night   long

## 2020-01-01 ENCOUNTER — Emergency Department (HOSPITAL_COMMUNITY)
Admission: EM | Admit: 2020-01-01 | Discharge: 2020-01-01 | Disposition: A | Discharge status: Home / Self Care | Payer: Self-pay | Attending: Emergency Medicine | Admitting: Emergency Medicine

## 2020-01-01 ENCOUNTER — Other Ambulatory Visit: Payer: Self-pay

## 2020-01-01 ENCOUNTER — Encounter (HOSPITAL_COMMUNITY): Payer: Self-pay | Admitting: Emergency Medicine

## 2020-01-01 DIAGNOSIS — Z86711 Personal history of pulmonary embolism: Secondary | ICD-10-CM | POA: Insufficient documentation

## 2020-01-01 DIAGNOSIS — F250 Schizoaffective disorder, bipolar type: Secondary | ICD-10-CM | POA: Insufficient documentation

## 2020-01-01 DIAGNOSIS — Z590 Homelessness: Secondary | ICD-10-CM | POA: Insufficient documentation

## 2020-01-01 DIAGNOSIS — F17210 Nicotine dependence, cigarettes, uncomplicated: Secondary | ICD-10-CM | POA: Insufficient documentation

## 2020-01-01 DIAGNOSIS — R440 Auditory hallucinations: Secondary | ICD-10-CM | POA: Insufficient documentation

## 2020-01-01 MED ORDER — RISPERIDONE 3 MG PO TABS
3.0000 mg | ORAL_TABLET | Freq: Once | ORAL | Status: AC
  Administered 2020-01-01: 11:00:00 3 mg via ORAL
  Filled 2020-01-01: qty 1

## 2020-01-01 MED ORDER — RISPERIDONE 1 MG PO TABS: 1.0000 mg | ORAL_TABLET | Freq: Once | ORAL | Status: DC

## 2020-01-01 NOTE — ED Triage Notes (Signed)
Pt arrives to ED from home with complaints of auditory hallucinations. Patient states that he hears voices in his head and that the voices are going to kill him. Patient denies SI/HI. Patient states hes been taking Risperdal for this and has been compliant.

## 2020-01-01 NOTE — ED Provider Notes (Signed)
[facility] EMERGENCY DEPARTMENT Provider Note   CSN: [id number] Arrival date & time: 01/01/20  4098     History Chief Complaint  Patient presents with  . Hallucinations    Cole Barrett is a 34 y.o. male with past medical history significant for homelessness, schizophrenia chronic auditory hallucinations who presents for evaluation of auditory hallucinations.  Patient did not take his Risperdal this morning or yesterday evening.  Patient denies SI, HI, visual hallucinations.  He denies any pain.  Patient denies command voices.  He supposed be taking Risperdal 1 mg every morning and 3 mg every afternoon.  Patient states he has chronic hallucinations on this medication however they are normally well controlled.  He is not sure why he did not take his medication yesterday evening or today.  States he does have the medications at home.  States normally after a "nap" and taking his medications he is "fine."  Does not feel that he needs inpatient management at this time.  Denies fever, chills, nausea, vomiting, headache, dizziness, lightheadedness, chest pain, shortness of breath, abdominal pain.  No recent head injury or trauma.  Patient well-known to the ED with 31 visits for similar complaints over the last 6 months.  Has been evaluated by psychiatry multiple times and has not met inpatient criteria.  Last time they felt his chronic hallucinations were at his baseline.  He is followed by [facility] outpatient however states he has not gone there for assessment for his hallucinations recently.  History obtained from patient and past medical records.  No interpreter is used.  HPI     Past Medical History:  Diagnosis Date  . Homelessness   . Hypercholesterolemia   . Mental disorder   . Paranoid behavior ([place])   . PE (pulmonary embolism)   . Schizophrenia [facility]     Patient Active Problem List   Diagnosis Date Noted  . Acute psychosis ([place]) 08/31/2019  .  Schizophrenia ([place]) 08/31/2019  . Schizoaffective disorder, bipolar type ([place]) 01/19/2019  . Alcohol abuse   . Auditory hallucinations   . Alcohol abuse with alcohol-induced mood disorder ([place]) 09/08/2016  . Homelessness 09/03/2016  . Person feigning illness 09/03/2016  . Brief psychotic disorder ([place]) 08/29/2016  . Cannabis use disorder, mild, abuse 08/29/2016  . Pulmonary emboli ([place]) 07/13/2016  . Adjustment disorder with emotional disturbance 03/12/2014  . Pulmonary embolism and infarction ([place]) 01/02/2014  . Acute left flank pain 01/02/2014  . Pulmonary embolism, bilateral ([place]) 01/02/2014    History reviewed. No pertinent surgical history.     Family History  Problem Relation Age of Onset  . Hypertension Mother     Social History   Tobacco Use  . Smoking status: Current Every Day Smoker    Packs/day: 0.50    Types: Cigarettes  . Smokeless tobacco: Never Used  Substance Use Topics  . Alcohol use: Yes    Comment: BAC not available  . Drug use: Yes    Types: Marijuana    Comment: Last used: 2 months ago UDS NA    Home Medications Prior to Admission medications   Medication Sig Start Date End Date Taking? Authorizing Provider  OLANZapine (ZYPREXA) 2.5 MG tablet Take 1 tablet (2.5 mg total) by mouth at bedtime. 12/09/19   Mesner, Corene Cornea, MD  risperiDONE (RISPERDAL) 1 MG tablet Take 1 tablet by mouth each morning.  Take 3 tablets by mouth at bedtime. Patient taking differently: Take 1-3 mg by mouth See admin instructions. Take 1 tablet  by mouth each morning. and Take 3 tablets by mouth at bedtime. 11/11/19   Larene Pickett, PA-C  amantadine (SYMMETREL) 100 MG capsule Take 1 capsule (100 mg total) by mouth 2 (two) times daily. Patient not taking: Reported on 11/04/2019 09/01/19 11/17/19  Mallie Darting, NP    Allergies    Haldol [haloperidol]  Review of Systems   Review of Systems  Constitutional: Negative.   HENT: Negative.   Eyes: Negative.   Respiratory:  Negative.   Cardiovascular: Negative.   Gastrointestinal: Negative.   Genitourinary: Negative.   Musculoskeletal: Negative.   Skin: Negative.   Psychiatric/Behavioral:       Auditory hallucinations  All other systems reviewed and are negative.   Physical Exam Updated Vital Signs BP 134/83 (BP Location: Left Arm)   Pulse 74   Temp 98 F (36.7 C) (Oral)   Resp 18   SpO2 99%   Physical Exam Vitals and nursing note reviewed.  Constitutional:      General: He is not in acute distress.    Appearance: He is well-developed. He is not ill-appearing, toxic-appearing or diaphoretic.  HENT:     Head: Normocephalic and atraumatic.     Nose: Nose normal.     Mouth/Throat:     Mouth: Mucous membranes are moist.     Pharynx: Oropharynx is clear.  Eyes:     Pupils: Pupils are equal, round, and reactive to light.  Cardiovascular:     Rate and Rhythm: Normal rate and regular rhythm.     Pulses: Normal pulses.     Heart sounds: Normal heart sounds.  Pulmonary:     Effort: Pulmonary effort is normal. No respiratory distress.     Breath sounds: Normal breath sounds.  Abdominal:     General: Bowel sounds are normal. There is no distension.     Palpations: Abdomen is soft.  Musculoskeletal:        General: Normal range of motion.     Cervical back: Normal range of motion and neck supple.  Skin:    General: Skin is warm and dry.  Neurological:     General: No focal deficit present.     Mental Status: He is alert and oriented to person, place, and time.  Psychiatric:     Comments: Admits to chronic AH. Denies SI, HI, visual hallucinations. Normal affect. Does not appear to be responding to internal stimuli. Linear speech. Able to carry full conversation without redirection.     ED Results / Procedures / Treatments   Labs (all labs ordered are listed, but only abnormal results are displayed) Labs Reviewed - No data to display  EKG None  Radiology No results  found.  Procedures Procedures (including critical care time)  Medications Ordered in ED Medications  risperiDONE (RISPERDAL) tablet 3 mg (3 mg Oral Given 01/01/20 1057)    ED Course  I have reviewed the triage vital signs and the nursing notes.  Pertinent labs & imaging results that were available during my care of the patient were reviewed by me and considered in my medical decision making (see chart for details).  34 year old male well-known to the emergency department 31 visits over the last 6 months presents for evaluation of chronic auditory hallucinations, similar to prior visits.  He missed his morning and his evening dose.  No new symptoms.  Denies SI, HI, command hallucinations.  No visual hallucinations.  Patient requesting his Resporal and a "nap".  He does not feel that he  needs inpatient management at this time.  Patient does not appear to be acutely psychotic or need of inpatient stabilization at this time.  Patient actually appears overall well.  He is able to hold a linear conversation does not need redirection.  He does not appear to be in responding to internal stimuli.  Patient reassessed after Risperdal and nap.  He continues to deny SI, HI, or hallucinations.  Patient states he is no longer hearing the voices after his nap in the respite all.  Patient is not felt to be a threat to self or others.  He is advised to continue taking his home Risperdal.  States he does not need a refill of this at this time.  Discussed patient to keep follow-up with [facility] for future needs to return to the emergency department.  His issues do appear to be chronic and at his baseline.  The patient has been appropriately medically screened and/or stabilized in the ED. I have low suspicion for any other emergent medical condition which would require further screening, evaluation or treatment in the ED or require inpatient management.  Patient is hemodynamically stable and in no acute distress.   Patient able to ambulate in department prior to ED.  Evaluation does not show acute pathology that would require ongoing or additional emergent interventions while in the emergency department or further inpatient treatment.  I have discussed the diagnosis with the patient and answered all questions.  Pain is been managed while in the emergency department and patient has no further complaints prior to discharge.  Patient is comfortable with plan discussed in room and is stable for discharge at this time.  I have discussed strict return precautions for returning to the emergency department.  Patient was encouraged to follow-up with PCP/specialist refer to at discharge.  Discussed plan with attending physician, Dr. Tyrone Nine who agrees with above treatment, plan  And disposition.    MDM Rules/Calculators/A&P                      Cole Barrett was evaluated in Emergency Department on 01/01/2020 for the symptoms described in the history of present illness. He was evaluated in the context of the global COVID-19 pandemic, which necessitated consideration that the patient might be at risk for infection with the SARS-CoV-2 virus that causes COVID-19. Institutional protocols and algorithms that pertain to the evaluation of patients at risk for COVID-19 are in a state of rapid change based on information released by regulatory bodies including the CDC and federal and state organizations. These policies and algorithms were followed during the patient's care in the ED. Final Clinical Impression(s) / ED Diagnoses Final diagnoses:  Continuous auditory hallucinations    Rx / DC Orders ED Discharge Orders    None       Shadai Mcclane A, PA-C 01/01/20 [place], Dan, DO 01/01/20 1534

## 2020-01-01 NOTE — ED Notes (Signed)
Pt back in waiting room now.  States he went to [employer].

## 2020-01-01 NOTE — Discharge Instructions (Signed)
Take your Resporal at home.  Return to the emergency department for any new or worsening symptoms.

## 2020-01-01 NOTE — ED Notes (Signed)
Unable to locate pt at this time

## 2020-01-03 ENCOUNTER — Emergency Department (HOSPITAL_COMMUNITY)
Admission: EM | Admit: 2020-01-03 | Discharge: 2020-01-04 | Disposition: A | Discharge status: Home / Self Care | Payer: Self-pay | Attending: Emergency Medicine | Admitting: Emergency Medicine

## 2020-01-03 DIAGNOSIS — Z79899 Other long term (current) drug therapy: Secondary | ICD-10-CM | POA: Insufficient documentation

## 2020-01-03 DIAGNOSIS — Z590 Homelessness: Secondary | ICD-10-CM | POA: Insufficient documentation

## 2020-01-03 DIAGNOSIS — R440 Auditory hallucinations: Secondary | ICD-10-CM | POA: Insufficient documentation

## 2020-01-03 DIAGNOSIS — R443 Hallucinations, unspecified: Secondary | ICD-10-CM

## 2020-01-03 DIAGNOSIS — F17210 Nicotine dependence, cigarettes, uncomplicated: Secondary | ICD-10-CM | POA: Insufficient documentation

## 2020-01-03 DIAGNOSIS — Z86711 Personal history of pulmonary embolism: Secondary | ICD-10-CM | POA: Insufficient documentation

## 2020-01-03 DIAGNOSIS — F250 Schizoaffective disorder, bipolar type: Secondary | ICD-10-CM | POA: Insufficient documentation

## 2020-01-03 NOTE — ED Notes (Signed)
Called pt to triage. Unable to locate pt in the lobby. Patient was found in [place], eating.

## 2020-01-04 ENCOUNTER — Encounter (HOSPITAL_COMMUNITY): Payer: Self-pay | Admitting: *Deleted

## 2020-01-04 ENCOUNTER — Other Ambulatory Visit: Payer: Self-pay

## 2020-01-04 MED ORDER — RISPERIDONE 3 MG PO TABS
3.0000 mg | ORAL_TABLET | Freq: Once | ORAL | Status: AC
  Administered 2020-01-04: 3 mg via ORAL
  Filled 2020-01-04: qty 1

## 2020-01-04 NOTE — ED Provider Notes (Signed)
[facility] EMERGENCY DEPARTMENT Provider Note  CSN: [id number] Arrival date & time: 01/03/20 2312  Chief Complaint(s) Hallucinations  HPI Cole Barrett is a 34 y.o. male with a past medical history listed below including schizophrenia on Risperdal who presents to the emergency department with auditory hallucinations.  Patient reports that these hallucinations are bad and are saying that they are going to kill him.  He denies any HI or SI.  Patient reports that he has been compliant with his medication.  States that he missed a dose of his medication several days ago but has been taking it since.  Denies any drug use.  Did endorse drinking alcohol earlier in the afternoon.  Voices began approximately 4 to 5 hours ago.  States that he was sitting down watching television when they began.  Denies any other physical complaints.  HPI  Past Medical History Past Medical History:  Diagnosis Date  . Homelessness   . Hypercholesterolemia   . Mental disorder   . Paranoid behavior ([place])   . PE (pulmonary embolism)   . Schizophrenia [facility])    Patient Active Problem List   Diagnosis Date Noted  . Acute psychosis ([place]) 08/31/2019  . Schizophrenia ([place]) 08/31/2019  . Schizoaffective disorder, bipolar type ([place]) 01/19/2019  . Alcohol abuse   . Auditory hallucinations   . Alcohol abuse with alcohol-induced mood disorder ([place]) 09/08/2016  . Homelessness 09/03/2016  . Person feigning illness 09/03/2016  . Brief psychotic disorder ([place]) 08/29/2016  . Cannabis use disorder, mild, abuse 08/29/2016  . Pulmonary emboli ([place]) 07/13/2016  . Adjustment disorder with emotional disturbance 03/12/2014  . Pulmonary embolism and infarction ([place]) 01/02/2014  . Acute left flank pain 01/02/2014  . Pulmonary embolism, bilateral ([place]) 01/02/2014   Home Medication(s) Prior to Admission medications   Medication Sig Start Date End Date Taking? Authorizing Provider  OLANZapine (ZYPREXA)  2.5 MG tablet Take 1 tablet (2.5 mg total) by mouth at bedtime. 12/09/19   Mesner, Corene Cornea, MD  risperiDONE (RISPERDAL) 1 MG tablet Take 1 tablet by mouth each morning.  Take 3 tablets by mouth at bedtime. Patient taking differently: Take 1-3 mg by mouth See admin instructions. Take 1 tablet by mouth each morning. and Take 3 tablets by mouth at bedtime. 11/11/19   Larene Pickett, PA-C  amantadine (SYMMETREL) 100 MG capsule Take 1 capsule (100 mg total) by mouth 2 (two) times daily. Patient not taking: Reported on 11/04/2019 09/01/19 11/17/19  Mallie Darting, NP                                                                                                                                    Past Surgical History History reviewed. No pertinent surgical history. Family History Family History  Problem Relation Age of Onset  . Hypertension Mother     Social History Social History   Tobacco Use  . Smoking status: Current Every Day  Smoker    Packs/day: 0.50    Types: Cigarettes  . Smokeless tobacco: Never Used  Substance Use Topics  . Alcohol use: Yes    Comment: BAC not available  . Drug use: Yes    Types: Marijuana    Comment: Last used: 2 months ago UDS NA   Allergies Haldol [haloperidol]  Review of Systems Review of Systems All other systems are reviewed and are negative for acute change except as noted in the HPI  Physical Exam Vital Signs  I have reviewed the triage vital signs BP (!) 146/89   Pulse 90   Temp 97.9 F (36.6 C)   Resp 16   SpO2 100%   Physical Exam Vitals reviewed.  Constitutional:      General: He is not in acute distress.    Appearance: He is well-developed. He is not diaphoretic.  HENT:     Head: Normocephalic and atraumatic.     Nose: Nose normal.  Eyes:     General: No scleral icterus.       Right eye: No discharge.        Left eye: No discharge.     Conjunctiva/sclera: Conjunctivae normal.     Pupils: Pupils are equal, round, and reactive to  light.  Cardiovascular:     Rate and Rhythm: Normal rate and regular rhythm.     Heart sounds: No murmur. No friction rub. No gallop.   Pulmonary:     Effort: Pulmonary effort is normal. No respiratory distress.     Breath sounds: Normal breath sounds. No stridor. No rales.  Abdominal:     General: There is no distension.     Palpations: Abdomen is soft.     Tenderness: There is no abdominal tenderness.  Musculoskeletal:        General: No tenderness.     Cervical back: Normal range of motion and neck supple.  Skin:    General: Skin is warm and dry.     Findings: No erythema or rash.  Neurological:     Mental Status: He is alert and oriented to person, place, and time.  Psychiatric:        Attention and Perception: Attention normal.        Mood and Affect: Mood normal.        Speech: Speech normal.        Behavior: Behavior normal.     ED Results and Treatments Labs (all labs ordered are listed, but only abnormal results are displayed) Labs Reviewed - No data to display                                                                                                                       EKG  EKG Interpretation  Date/Time:    Ventricular Rate:    PR Interval:    QRS Duration:   QT Interval:    QTC Calculation:   R Axis:     Text Interpretation:  Radiology No results found.  Pertinent labs & imaging results that were available during my care of the patient were reviewed by me and considered in my medical decision making (see chart for details).  Medications Ordered in ED Medications  risperiDONE (RISPERDAL) tablet 3 mg (has no administration in time range)                                                                                                                                    Procedures Procedures  (including critical care time)  Medical Decision Making / ED Course I have reviewed the nursing notes for this encounter and the patient's prior  records (if available in EHR or on provided paperwork).   [name] was evaluated in Emergency Department on 01/04/2020 for the symptoms described in the history of present illness. He was evaluated in the context of the global COVID-19 pandemic, which necessitated consideration that the patient might be at risk for infection with the SARS-CoV-2 virus that causes COVID-19. Institutional protocols and algorithms that pertain to the evaluation of patients at risk for COVID-19 are in a state of rapid change based on information released by regulatory bodies including the CDC and federal and state organizations. These policies and algorithms were followed during the patient's care in the ED.  Patient evaluated by behavioral health and cleared for outpatient management.  Prior to being discharged patient requested a Risperdal dose.  I have suspicion patient is not being truthful regarding his compliance with medication.  The patient appears reasonably screened and/or stabilized for discharge and I doubt any other medical condition or other [facility] requiring further screening, evaluation, or treatment in the ED at this time prior to discharge.  The patient is safe for discharge with strict return precautions.       Final Clinical Impression(s) / ED Diagnoses Final diagnoses:  Hallucinations      This chart was dictated using voice recognition software.  Despite best efforts to proofread,  errors can occur which can change the documentation meaning.   [name], MD 01/04/20 [id number]

## 2020-01-04 NOTE — ED Triage Notes (Signed)
Pt reports he is hearing voices, no SI or HI. Alert, reports taking his meds as prescribed.

## 2020-01-04 NOTE — BH Assessment (Signed)
Tele Assessment Note   Patient Name: Cole Barrett MRN: [id number] Referring Physician: [name], MD Location of Patient: [facility] Location of Provider: Behavioral Health TTS Department  Cole Barrett is an 34 y.o. male who presents to the ED voluntarily. Pt reports he is experiencing AH that tell him they are going to kill him. Pt is smiling throughout the assessment and states he wants to come to [facility] and asks this writer to tell them to bring him to the facility. Pt states he was watching television and "the voices got bad" prompting him to come to the ED. Pt states he wants to talk with a doctor so that he can get help. Pt states he sees a psychiatrist at [facility] and reports he takes his Risperdal regularly, without missing any doses. Pt frequents the ED c/o similar concerns. Pt has 32 ED visits in the past 6 months. Pt denies SI and denies HI. TTS asked the pt for consent to speak with collateral supports and he states he has none. Pt denies other complaints or stressors at this time.    Per [name], NP pt does not meet criteria for inpt tx. Pt recommended to follow up with his current MH provider. ED staff advised of disposition.   Diagnosis: Schizophrenia  Past Medical History:  Past Medical History:  Diagnosis Date  . Homelessness   . Hypercholesterolemia   . Mental disorder   . Paranoid behavior ([facility])   . PE (pulmonary embolism)   . Schizophrenia ([facility])     History reviewed. No pertinent surgical history.  Family History:  Family History  Problem Relation Age of Onset  . Hypertension Mother     Social History:  reports that he has been smoking cigarettes. He has been smoking about 0.50 packs per day. He has never used smokeless tobacco. He reports current alcohol use. He reports current drug use. Drug: Marijuana.  Additional Social History:  Alcohol / Drug Use Pain Medications: See MAR Prescriptions: See MAR Over the Counter: See  MAR History of alcohol / drug use?: No history of alcohol / drug abuse  CIWA: CIWA-Ar BP: (!) 146/89 Pulse Rate: 90 COWS:    Allergies:  Allergies  Allergen Reactions  . Haldol [Haloperidol] Shortness Of Breath    Home Medications: (Not in a hospital admission)   OB/GYN Status:  No LMP for male patient.  General Assessment Data Location of Assessment: [facility] ED TTS Assessment: In system Is this a Tele or Face-to-Face Assessment?: Tele Assessment Is this an Initial Assessment or a Re-assessment for this encounter?: Initial Assessment Patient Accompanied by:: N/A Language Other than English: No Living Arrangements: Homeless/Shelter What gender do you identify as?: Male Marital status: Single Pregnancy Status: No Living Arrangements: Alone Can pt return to current living arrangement?: Yes Admission Status: Voluntary Is patient capable of signing voluntary admission?: Yes Referral Source: Self/Family/Friend Insurance type: none     Crisis Care Plan Living Arrangements: Alone Name of Psychiatrist: [facility].  Name of Therapist: NA  Education Status Is patient currently in school?: No Is the patient employed, unemployed or receiving disability?: Employed  Risk to self with the past 6 months Suicidal Ideation: No Has patient been a risk to self within the past 6 months prior to admission? : No Suicidal Intent: No Has patient had any suicidal intent within the past 6 months prior to admission? : No Is patient at risk for suicide?: No Suicidal Plan?: No Has patient had any suicidal plan within the  past 6 months prior to admission? : No Access to Means: No What has been your use of drugs/alcohol within the last 12 months?: denies Previous Attempts/Gestures: No Triggers for Past Attempts: None known Intentional Self Injurious Behavior: None Family Suicide History: No Recent stressful life event(s): Other (Comment)(AH) Persecutory voices/beliefs?: Yes Depression:  No Substance abuse history and/or treatment for substance abuse?: No Suicide prevention information given to non-admitted patients: Not applicable  Risk to Others within the past 6 months Homicidal Ideation: No Does patient have any lifetime risk of violence toward others beyond the six months prior to admission? : No Thoughts of Harm to Others: No Current Homicidal Intent: No Current Homicidal Plan: No Access to Homicidal Means: No History of harm to others?: No Assessment of Violence: None Noted Does patient have access to weapons?: No Criminal Charges Pending?: No Does patient have a court date: No Is patient on probation?: No  Psychosis Hallucinations: Auditory Delusions: None noted  Mental Status Report Appearance/Hygiene: Unremarkable Eye Contact: Good Motor Activity: Freedom of movement Speech: Logical/coherent Level of Consciousness: Alert Mood: Euthymic, Pleasant Affect: Appropriate to circumstance Anxiety Level: None Thought Processes: Coherent, Relevant Judgement: Partial Orientation: Person, Place, Time, Situation, Appropriate for developmental age Obsessive Compulsive Thoughts/Behaviors: None  Cognitive Functioning Concentration: Normal Memory: Remote Intact, Recent Intact Is patient IDD: No Insight: Fair Impulse Control: Fair Appetite: Good Have you had any weight changes? : No Change Sleep: No Change Total Hours of Sleep: 8 Vegetative Symptoms: None  ADLScreening [facility] Assessment Services) Patient's cognitive ability adequate to safely complete daily activities?: Yes Patient able to express need for assistance with ADLs?: Yes Independently performs ADLs?: Yes (appropriate for developmental age)  Prior Inpatient Therapy Prior Inpatient Therapy: Yes Prior Therapy Dates: Per chart, "2018, 2019."  Prior Therapy Facilty/Provider(s): [facility]. Reason for Treatment: AH.  Prior Outpatient Therapy Prior Outpatient Therapy: Yes Prior Therapy Dates:  Current. Prior Therapy Facilty/Provider(s): [facility] Reason for Treatment: Medication management.  Does patient have an ACCT team?: No Does patient have Intensive In-House Services?  : No Does patient have [facility] services? : Yes Does patient have P4CC services?: No  ADL Screening (condition at time of admission) Patient's cognitive ability adequate to safely complete daily activities?: Yes Is the patient deaf or have difficulty hearing?: No Does the patient have difficulty seeing, even when wearing glasses/contacts?: No Does the patient have difficulty concentrating, remembering, or making decisions?: No Patient able to express need for assistance with ADLs?: Yes Does the patient have difficulty dressing or bathing?: No Independently performs ADLs?: Yes (appropriate for developmental age) Does the patient have difficulty walking or climbing stairs?: No Weakness of Legs: None Weakness of Arms/Hands: None  Home Assistive Devices/Equipment Home Assistive Devices/Equipment: None    Abuse/Neglect Assessment (Assessment to be complete while patient is alone) Abuse/Neglect Assessment Can Be Completed: Yes Physical Abuse: Denies Verbal Abuse: Denies Sexual Abuse: Denies Exploitation of patient/patient's resources: Denies Self-Neglect: Denies     Regulatory affairs officer (For Healthcare) Does Patient Have a Medical Advance Directive?: No Would patient like information on creating a medical advance directive?: No - Patient declined          Disposition: Per Lindon Romp, NP pt does not meet criteria for inpt tx. Pt recommended to follow up with his current [place] provider. ED staff advised of disposition.  Disposition Initial Assessment Completed for this Encounter: Yes Disposition of Patient: Discharge Patient refused recommended treatment: No Mode of transportation if patient is discharged/movement?: Walking  This service was provided via  telemedicine using a 2-way, interactive audio  and Immunologist.  Names of all persons participating in this telemedicine service and their role in this encounter. Name:  Elven Laboy Role: Patient  Name: Princess Bruins Role: TTS          [name] 01/04/2020 2:42 AM

## 2020-01-06 ENCOUNTER — Other Ambulatory Visit: Payer: Self-pay

## 2020-01-06 ENCOUNTER — Encounter (HOSPITAL_COMMUNITY): Payer: Self-pay | Admitting: Emergency Medicine

## 2020-01-06 ENCOUNTER — Emergency Department (HOSPITAL_COMMUNITY)
Admission: EM | Admit: 2020-01-06 | Discharge: 2020-01-07 | Disposition: A | Discharge status: Home / Self Care | Payer: Self-pay | Attending: Emergency Medicine | Admitting: Emergency Medicine

## 2020-01-06 DIAGNOSIS — Z79899 Other long term (current) drug therapy: Secondary | ICD-10-CM | POA: Insufficient documentation

## 2020-01-06 DIAGNOSIS — F17210 Nicotine dependence, cigarettes, uncomplicated: Secondary | ICD-10-CM | POA: Insufficient documentation

## 2020-01-06 DIAGNOSIS — R440 Auditory hallucinations: Secondary | ICD-10-CM | POA: Insufficient documentation

## 2020-01-06 DIAGNOSIS — F209 Schizophrenia, unspecified: Secondary | ICD-10-CM | POA: Insufficient documentation

## 2020-01-06 NOTE — ED Triage Notes (Signed)
Patient here from home with complaints of auditory hallucinations that started today. States that he did take his Risperdal today. Denies SI/HI.

## 2020-01-07 NOTE — Discharge Instructions (Addendum)
Please follow-up with [facility] at their walk-in clinic for optimal management of your hallucinations/schizophrenia.

## 2020-01-07 NOTE — ED Notes (Signed)
Offered to recheck patient's vitals prior to discharge. Pt declined. Pt in no distress.  

## 2020-01-07 NOTE — ED Provider Notes (Signed)
[facility] DEPT Provider Note   CSN: [id number] Arrival date & time: 01/06/20  2227     History Chief Complaint  Patient presents with  . Hallucinations    Cole Barrett is a 34 y.o. male.  HPI    34 year old male with history of schizophrenia comes in a chief complaint of hallucinations. Patient reports that he was at work when he started having worsening voices.  The voices are telling him to hurt himself.  However he does not have any SI, HI.  Patient is taking his medication as prescribed.  He denies any substance abuse.  He has not followed with [facility] as requested of him.  Pt denies nausea, emesis, fevers, chills, chest pains, shortness of breath, headaches, abdominal pain, uti like symptoms.   Past Medical History:  Diagnosis Date  . Homelessness   . Hypercholesterolemia   . Mental disorder   . Paranoid behavior ([place])   . PE (pulmonary embolism)   . Schizophrenia [facility])     Patient Active Problem List   Diagnosis Date Noted  . Acute psychosis ([place]) 08/31/2019  . Schizophrenia ([place]) 08/31/2019  . Schizoaffective disorder, bipolar type ([place]) 01/19/2019  . Alcohol abuse   . Auditory hallucinations   . Alcohol abuse with alcohol-induced mood disorder ([place]) 09/08/2016  . Homelessness 09/03/2016  . Person feigning illness 09/03/2016  . Brief psychotic disorder ([place]) 08/29/2016  . Cannabis use disorder, mild, abuse 08/29/2016  . Pulmonary emboli ([place]) 07/13/2016  . Adjustment disorder with emotional disturbance 03/12/2014  . Pulmonary embolism and infarction ([place]) 01/02/2014  . Acute left flank pain 01/02/2014  . Pulmonary embolism, bilateral ([place]) 01/02/2014    History reviewed. No pertinent surgical history.     Family History  Problem Relation Age of Onset  . Hypertension Mother     Social History   Tobacco Use  . Smoking status: Current Every Day Smoker    Packs/day: 0.50    Types: Cigarettes  .  Smokeless tobacco: Never Used  Substance Use Topics  . Alcohol use: Yes    Comment: BAC not available  . Drug use: Yes    Types: Marijuana    Comment: Last used: 2 months ago UDS NA    Home Medications Prior to Admission medications   Medication Sig Start Date End Date Taking? Authorizing Provider  OLANZapine (ZYPREXA) 2.5 MG tablet Take 1 tablet (2.5 mg total) by mouth at bedtime. 12/09/19  Yes Mesner, Corene Cornea, MD  risperiDONE (RISPERDAL) 1 MG tablet Take 1 tablet by mouth each morning.  Take 3 tablets by mouth at bedtime. Patient taking differently: Take 1-3 mg by mouth See admin instructions. Take 1 tablet by mouth each morning. and Take 3 tablets by mouth at bedtime. 11/11/19  Yes Larene Pickett, PA-C  amantadine (SYMMETREL) 100 MG capsule Take 1 capsule (100 mg total) by mouth 2 (two) times daily. Patient not taking: Reported on 11/04/2019 09/01/19 11/17/19  Mallie Darting, NP    Allergies    Haldol [haloperidol]  Review of Systems   Review of Systems  Constitutional: Positive for activity change.  Respiratory: Negative for shortness of breath.   Cardiovascular: Negative for chest pain.  Psychiatric/Behavioral: Negative for suicidal ideas.  All other systems reviewed and are negative.   Physical Exam Updated Vital Signs BP 128/85 (BP Location: Left Arm)   Pulse 85   Temp 98.1 F (36.7 C) (Oral)   Resp 17   Ht 5\' 10"  (1.778 m)   Wt  99.8 kg   SpO2 98%   BMI 31.57 kg/m   Physical Exam Vitals and nursing note reviewed.  Constitutional:      Appearance: He is well-developed.  HENT:     Head: Atraumatic.  Cardiovascular:     Rate and Rhythm: Normal rate.  Pulmonary:     Effort: Pulmonary effort is normal.  Musculoskeletal:     Cervical back: Neck supple.  Skin:    General: Skin is warm.  Neurological:     Mental Status: He is alert and oriented to person, place, and time.  Psychiatric:        Mood and Affect: Mood normal.        Behavior: Behavior normal.          Thought Content: Thought content normal.     ED Results / Procedures / Treatments   Labs (all labs ordered are listed, but only abnormal results are displayed) Labs Reviewed - No data to display  EKG None  Radiology No results found.  Procedures Procedures (including critical care time)  Medications Ordered in ED Medications - No data to display  ED Course  I have reviewed the triage vital signs and the nursing notes.  Pertinent labs & imaging results that were available during my care of the patient were reviewed by me and considered in my medical decision making (see chart for details).    MDM Rules/Calculators/A&P                      34 year old comes in a chief complaint of hallucinations.  He has no SI, HI and he does not appear to be responding to internal stimuli during our evaluation.  Essentially patient does not appear to be decompensated and we do not think he needs psych eval.  He has been advised to follow-up with [facility].  We will give him his oral Risperdal here.  Patient is comfortable with the plan.  Final Clinical Impression(s) / ED Diagnoses Final diagnoses:  Auditory hallucination    Rx / DC Orders ED Discharge Orders    None       Derwood Kaplan, MD 01/07/20 [phone]

## 2020-01-20 ENCOUNTER — Encounter (HOSPITAL_COMMUNITY): Payer: Self-pay

## 2020-01-20 ENCOUNTER — Emergency Department (HOSPITAL_COMMUNITY)
Admission: EM | Admit: 2020-01-20 | Discharge: 2020-01-20 | Disposition: A | Discharge status: Home / Self Care | Payer: Self-pay | Attending: Emergency Medicine | Admitting: Emergency Medicine

## 2020-01-20 DIAGNOSIS — F1210 Cannabis abuse, uncomplicated: Secondary | ICD-10-CM | POA: Insufficient documentation

## 2020-01-20 DIAGNOSIS — R440 Auditory hallucinations: Secondary | ICD-10-CM | POA: Insufficient documentation

## 2020-01-20 DIAGNOSIS — R443 Hallucinations, unspecified: Secondary | ICD-10-CM

## 2020-01-20 DIAGNOSIS — Z79899 Other long term (current) drug therapy: Secondary | ICD-10-CM | POA: Insufficient documentation

## 2020-01-20 DIAGNOSIS — F17210 Nicotine dependence, cigarettes, uncomplicated: Secondary | ICD-10-CM | POA: Insufficient documentation

## 2020-01-20 MED ORDER — RISPERIDONE 1 MG PO TABS: ORAL_TABLET | ORAL | 0 refills | Status: DC

## 2020-01-20 MED ORDER — RISPERIDONE 1 MG PO TABS
1.0000 mg | ORAL_TABLET | Freq: Once | ORAL | Status: AC
  Administered 2020-01-20: 1 mg via ORAL
  Filled 2020-01-20: qty 1

## 2020-01-20 NOTE — ED Triage Notes (Signed)
Patient states "I was hearing voices."  States it started at 4 am.  States the voices are saying they are going to kill him.  Denies SI/HI

## 2020-01-20 NOTE — Discharge Instructions (Addendum)
Please take medications as directed.  Return to the ER for new or worsening symptoms.

## 2020-01-20 NOTE — ED Provider Notes (Signed)
[name] EMERGENCY DEPARTMENT Provider Note   CSN: [id number] Arrival date & time: 01/20/20  0450     History Chief Complaint  Patient presents with  . Hallucinations    Cole Barrett is a 34 y.o. male.  Patient presents to the emergency department with a chief complaint of hallucinations. He states that he has been hearing voices. They have been "louder than normal." He is here requesting a dose of his morning Risperdal. He states that he does not have anymore. He also asked for a refill. He denies any SI or HI. Denies any other complaints.  The history is provided by the patient. No language interpreter was used.       Past Medical History:  Diagnosis Date  . Homelessness   . Hypercholesterolemia   . Mental disorder   . Paranoid behavior (HCC)   . PE (pulmonary embolism)   . Schizophrenia [facility])     Patient Active Problem List   Diagnosis Date Noted  . Acute psychosis (HCC) 08/31/2019  . Schizophrenia (HCC) 08/31/2019  . Schizoaffective disorder, bipolar type (HCC) 01/19/2019  . Alcohol abuse   . Auditory hallucinations   . Alcohol abuse with alcohol-induced mood disorder (HCC) 09/08/2016  . Homelessness 09/03/2016  . Person feigning illness 09/03/2016  . 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 Cannabis use disorder, mild, abuse 08/29/2016  . Pulmonary emboli (HCC) 07/13/2016  . Adjustment disorder with emotional disturbance 03/12/2014  . 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 Acute left flank pain 01/02/2014  . Pulmonary embolism, bilateral (HCC) 01/02/2014    History reviewed. No pertinent surgical history.     Family History  Problem Relation Age of Onset  . Hypertension Mother     Social History   Tobacco Use  . Smoking status: Current Every Day Smoker    Packs/day: 0.50    Types: Cigarettes  . Smokeless tobacco: Never Used  Substance Use Topics  . Alcohol use: Yes    Comment: BAC not  available  . Drug use: Yes    Types: Marijuana    Comment: Last used: 2 months ago UDS NA    Home Medications Prior to Admission medications   Medication Sig Start Date End Date Taking? Authorizing Provider  OLANZapine (ZYPREXA) 2.5 MG tablet Take 1 tablet (2.5 mg total) by mouth at bedtime. 12/09/19   Mesner, Barbara Cower, MD  risperiDONE (RISPERDAL) 1 MG tablet Take 1 tablet by mouth each morning.  Take 3 tablets by mouth at bedtime. Patient taking differently: Take 1-3 mg by mouth See admin instructions. Take 1 tablet by mouth each morning. and Take 3 tablets by mouth at bedtime. 11/11/19   Garlon Hatchet, PA-C  amantadine (SYMMETREL) 100 MG capsule Take 1 capsule (100 mg total) by mouth 2 (two) times daily. Patient not taking: Reported on 11/04/2019 09/01/19 11/17/19  [name], NP    Allergies    Haldol [haloperidol]  Review of Systems   Review of Systems  All other systems reviewed and are negative.   Physical Exam Updated Vital Signs BP 127/80   Pulse 77   Temp 98.8 F (37.1 C)   Resp 20   Ht 5\' 10"  (1.778 m)   Wt 99.8 kg   SpO2 99%   BMI 31.57 kg/m   Physical Exam Vitals and nursing note reviewed.  Constitutional:      General: He is not in acute distress.    Appearance: He is well-developed. He is not  ill-appearing.  HENT:     Head: Normocephalic and atraumatic.  Eyes:     Conjunctiva/sclera: Conjunctivae normal.  Cardiovascular:     Rate and Rhythm: Normal rate.  Pulmonary:     Effort: Pulmonary effort is normal. No respiratory distress.  Abdominal:     General: There is no distension.  Musculoskeletal:     Cervical back: Neck supple.     Comments: Moves all extremities  Skin:    General: Skin is warm and dry.  Neurological:     Mental Status: He is alert and oriented to person, place, and time.  Psychiatric:        Mood and Affect: Mood normal.        Behavior: Behavior normal.     ED Results / Procedures / Treatments   Labs (all labs ordered  are listed, but only abnormal results are displayed) Labs Reviewed - No data to display  EKG None  Radiology No results found.  Procedures Procedures (including critical care time)  Medications Ordered in ED Medications - No data to display  ED Course  I have reviewed the triage vital signs and the nursing notes.  Pertinent labs & imaging results that were available during my care of the patient were reviewed by me and considered in my medical decision making (see chart for details).    MDM Rules/Calculators/A&P                      Patient here with his typical hallucinations. He states that he has run out of his medications, and is requesting a refill of his Risperdal. He denies any other complaints. Vital signs are stable. He is in no acute distress.   Final Clinical Impression(s) / ED Diagnoses Final diagnoses:  Hallucinations    Rx / DC Orders ED Discharge Orders         Ordered    risperiDONE (RISPERDAL) 1 MG tablet     01/20/20 0543           Montine Circle, PA-C 11/29/70 5366    Delora Fuel, MD 44/03/47 [phone]

## 2020-01-24 ENCOUNTER — Emergency Department (HOSPITAL_COMMUNITY)
Admission: EM | Admit: 2020-01-24 | Discharge: 2020-01-24 | Disposition: A | Discharge status: Home / Self Care | Payer: Self-pay | Attending: Emergency Medicine | Admitting: Emergency Medicine

## 2020-01-24 ENCOUNTER — Other Ambulatory Visit: Payer: Self-pay

## 2020-01-24 DIAGNOSIS — F17210 Nicotine dependence, cigarettes, uncomplicated: Secondary | ICD-10-CM | POA: Insufficient documentation

## 2020-01-24 DIAGNOSIS — R440 Auditory hallucinations: Secondary | ICD-10-CM | POA: Insufficient documentation

## 2020-01-24 DIAGNOSIS — Z590 Homelessness: Secondary | ICD-10-CM | POA: Insufficient documentation

## 2020-01-24 DIAGNOSIS — Z79899 Other long term (current) drug therapy: Secondary | ICD-10-CM | POA: Insufficient documentation

## 2020-01-24 MED ORDER — RISPERIDONE 1 MG PO TABS
1.0000 mg | ORAL_TABLET | Freq: Once | ORAL | Status: AC
  Administered 2020-01-24: 05:00:00 1 mg via ORAL
  Filled 2020-01-24: qty 1

## 2020-01-24 NOTE — ED Notes (Signed)
Pt denies wanting to hurt himself. PT also seems very lethargic, and has difficulty staying awake.

## 2020-01-24 NOTE — Discharge Instructions (Addendum)
1. Medications: Please fill your Risperdal prescription, usual home medications 2. Treatment: rest, drink plenty of fluids, 3. Follow Up: Please followup with your primary doctor in 1-2 days for discussion of your diagnoses and further evaluation after today's visit; if you do not have a primary care doctor use the resource guide provided to find one; Please return to the ER for new or concerning symptoms

## 2020-01-24 NOTE — ED Triage Notes (Signed)
Per Pt, Pt states that he has been hearing voices. When asked the pt states that he has not experienced this before, and the voices say "they are going to kill me."

## 2020-01-24 NOTE — ED Provider Notes (Signed)
[facility]-EMERGENCY DEPT Provider Note   CSN: [id number] Arrival date & time: 01/24/20  0131     History Chief Complaint  Patient presents with  . Auditory hallucinations    Cole Barrett is a 34 y.o. male with a hx of schizophrenia presents to the Emergency Department complaining of gradual, persistent, progressively worsening auditory hallucinations that have been ongoing.  Patient reports that the voices tell him that they are going to kill him.  Patient reports the voices are unchanged from previous episodes.  He denies homicidal or suicidal ideation.  He reports that he has not taken his Risperdal in several weeks.  He states he was given a refill several days ago but has not filled it yet.  Denies alcohol or drug usage.  Patient reports they come and go but of been persistent all evening.  He denies any physical complaints tonight.  Records reviewed.  Patient has been here numerous times for similar complaints.  The history is provided by the patient and medical records. No language interpreter was used.       Past Medical History:  Diagnosis Date  . Homelessness   . Hypercholesterolemia   . Mental disorder   . Paranoid behavior (HCC)   . PE (pulmonary embolism)   . Schizophrenia [facility])     Patient Active Problem List   Diagnosis Date Noted  . Acute psychosis (HCC) 08/31/2019  . Schizophrenia (HCC) 08/31/2019  . Schizoaffective disorder, bipolar type (HCC) 01/19/2019  . Alcohol abuse   . Auditory hallucinations   . Alcohol abuse with alcohol-induced mood disorder (HCC) 09/08/2016  . Homelessness 09/03/2016  . Person feigning illness 09/03/2016  . 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 Cannabis use disorder, mild, abuse 08/29/2016  . Pulmonary emboli (HCC) 07/13/2016  . Adjustment disorder with emotional disturbance 03/12/2014  . 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 Acute left flank pain 01/02/2014  .  Pulmonary embolism, bilateral (HCC) 01/02/2014    No past surgical history on file.     Family History  Problem Relation Age of Onset  . Hypertension Mother     Social History   Tobacco Use  . Smoking status: Current Every Day Smoker    Packs/day: 0.50    Types: Cigarettes  . Smokeless tobacco: Never Used  Substance Use Topics  . Alcohol use: Yes    Comment: BAC not available  . Drug use: Yes    Types: Marijuana    Comment: Last used: 2 months ago UDS NA    Home Medications Prior to Admission medications   Medication Sig Start Date End Date Taking? Authorizing Provider  OLANZapine (ZYPREXA) 2.5 MG tablet Take 1 tablet (2.5 mg total) by mouth at bedtime. 12/09/19  Yes Mesner, Barbara Cower, MD  risperiDONE (RISPERDAL) 1 MG tablet Take 1 tablet by mouth each morning.  Take 3 tablets by mouth at bedtime. 01/20/20  Yes Roxy Horseman, PA-C  amantadine (SYMMETREL) 100 MG capsule Take 1 capsule (100 mg total) by mouth 2 (two) times daily. Patient not taking: Reported on 11/04/2019 09/01/19 11/17/19  [name], NP    Allergies    Haldol [haloperidol]  Review of Systems   Review of Systems  Constitutional: Negative for appetite change, diaphoresis, fatigue, fever and unexpected weight change.  HENT: Negative for mouth sores.   Eyes: Negative for visual disturbance.  Respiratory: Negative for cough, chest tightness, shortness of breath and wheezing.   Cardiovascular: Negative for chest pain.  Gastrointestinal: Negative  for abdominal pain, constipation, diarrhea, nausea and vomiting.  Endocrine: Negative for polydipsia, polyphagia and polyuria.  Genitourinary: Negative for dysuria, frequency, hematuria and urgency.  Musculoskeletal: Negative for back pain and neck stiffness.  Skin: Negative for rash.  Allergic/Immunologic: Negative for immunocompromised state.  Neurological: Negative for syncope, light-headedness and headaches.  Hematological: Does not bruise/bleed easily.   Psychiatric/Behavioral: Positive for hallucinations. Negative for sleep disturbance. The patient is not nervous/anxious.     Physical Exam Updated Vital Signs BP 132/77   Pulse 62   Temp 97.8 F (36.6 C)   Resp 14   Ht 5\' 10"  (1.778 m)   Wt 99.8 kg   SpO2 100%   BMI 31.57 kg/m   Physical Exam Vitals and nursing note reviewed.  Constitutional:      General: He is not in acute distress.    Appearance: He is not diaphoretic.  HENT:     Head: Normocephalic.  Eyes:     General: No scleral icterus.    Conjunctiva/sclera: Conjunctivae normal.  Cardiovascular:     Rate and Rhythm: Normal rate and regular rhythm.     Pulses: Normal pulses.          Radial pulses are 2+ on the right side and 2+ on the left side.  Pulmonary:     Effort: No tachypnea, accessory muscle usage, prolonged expiration, respiratory distress or retractions.     Breath sounds: No stridor.     Comments: Equal chest rise. No increased work of breathing. Abdominal:     General: There is no distension.     Palpations: Abdomen is soft.     Tenderness: There is no abdominal tenderness. There is no guarding or rebound.  Musculoskeletal:     Cervical back: Normal range of motion.     Comments: Moves all extremities equally and without difficulty.  Skin:    General: Skin is warm and dry.     Capillary Refill: Capillary refill takes less than 2 seconds.  Neurological:     Mental Status: He is alert.     GCS: GCS eye subscore is 4. GCS verbal subscore is 5. GCS motor subscore is 6.     Comments: Speech is clear and goal oriented.  Psychiatric:        Mood and Affect: Mood normal.     ED Results / Procedures / Treatments   Labs (all labs ordered are listed, but only abnormal results are displayed) Labs Reviewed - No data to display  EKG None  Radiology No results found.  Procedures Procedures (including critical care time)  Medications Ordered in ED Medications  risperiDONE (RISPERDAL) tablet 1  mg (1 mg Oral Given 01/24/20 0430)    ED Course  I have reviewed the triage vital signs and the nursing notes.  Pertinent labs & imaging results that were available during my care of the patient were reviewed by me and considered in my medical decision making (see chart for details).    MDM Rules/Calculators/A&P                      Patient with history of medication noncompliance and homelessness along with paranoid schizophrenia presents for auditory hallucinations.  Patient is often seen here in the emergency department for same.  He reports voices are the same tonight.  No SI/HI.  Has not been taking his Risperdal despite recently being given a prescription for refill.  Will give Risperdal tonight and reassess.  5:41 AM Patient  reports he is feeling much better after his Risperdal and a nap.  He reports his auditory hallucinations have improved and he feels safe to go home.  He continues to deny suicidal or homicidal ideation.  Patient reports he does still have the prescription from the other day.  I strongly encouraged him to fill it and take it.  Patient states understanding and is in agreement with the plan.   Final Clinical Impression(s) / ED Diagnoses Final diagnoses:  Auditory hallucinations    Rx / DC Orders ED Discharge Orders    None       Mardene Sayer Boyd Kerbs 01/24/20 0541    [name], MD 01/24/20 [phone]

## 2020-01-24 NOTE — ED Notes (Signed)
Pt called for triage pt eating in lobby and request to finish eating before coming in

## 2020-01-30 ENCOUNTER — Emergency Department (HOSPITAL_COMMUNITY)
Admission: EM | Admit: 2020-01-30 | Discharge: 2020-01-30 | Disposition: A | Discharge status: Home / Self Care | Payer: Self-pay | Attending: Emergency Medicine | Admitting: Emergency Medicine

## 2020-01-30 ENCOUNTER — Encounter (HOSPITAL_COMMUNITY): Payer: Self-pay | Admitting: *Deleted

## 2020-01-30 ENCOUNTER — Other Ambulatory Visit: Payer: Self-pay

## 2020-01-30 DIAGNOSIS — F17210 Nicotine dependence, cigarettes, uncomplicated: Secondary | ICD-10-CM | POA: Insufficient documentation

## 2020-01-30 DIAGNOSIS — F209 Schizophrenia, unspecified: Secondary | ICD-10-CM | POA: Insufficient documentation

## 2020-01-30 DIAGNOSIS — Z760 Encounter for issue of repeat prescription: Secondary | ICD-10-CM | POA: Insufficient documentation

## 2020-01-30 DIAGNOSIS — Z79899 Other long term (current) drug therapy: Secondary | ICD-10-CM | POA: Insufficient documentation

## 2020-01-30 MED ORDER — RISPERIDONE 0.5 MG PO TABS
1.0000 mg | ORAL_TABLET | Freq: Once | ORAL | Status: AC
  Administered 2020-01-30: 1 mg via ORAL
  Filled 2020-01-30: qty 2

## 2020-01-30 MED ORDER — RISPERIDONE 1 MG PO TABS
1.0000 mg | ORAL_TABLET | Freq: Once | ORAL | Status: DC
  Filled 2020-01-30 (×2): qty 1

## 2020-01-30 NOTE — ED Triage Notes (Signed)
The pt reports that heis out of his seroquel  He needs another rx  When asked if he had a regular doctor he replied I dont have one  Sitting with his eyes closed

## 2020-01-30 NOTE — ED Notes (Signed)
Patient verbalizes understanding of discharge instructions. Opportunity for questioning and answers were provided. Armband removed by staff, pt discharged from ED.  

## 2020-01-30 NOTE — ED Provider Notes (Signed)
[facility] EMERGENCY DEPARTMENT Provider Note   CSN: [id number] Arrival date & time: 01/30/20  0555     History Chief Complaint  Patient presents with  . med refill    Cole Barrett is a 34 y.o. male.  Pt presents to the ED wanting 1 dose of his Risperdal.  He said he has an appointment later today at [facility] to get his month supply, but is out and wants his dose before he goes to work.  Pt denies si/hi.          Past Medical History:  Diagnosis Date  . Homelessness   . Hypercholesterolemia   . Mental disorder   . Paranoid behavior ([place])   . PE (pulmonary embolism)   . Schizophrenia [facility])     Patient Active Problem List   Diagnosis Date Noted  . Acute psychosis ([place]) 08/31/2019  . Schizophrenia ([place]) 08/31/2019  . Schizoaffective disorder, bipolar type ([place]) 01/19/2019  . Alcohol abuse   . Auditory hallucinations   . Alcohol abuse with alcohol-induced mood disorder ([place]) 09/08/2016  . Homelessness 09/03/2016  . Person feigning illness 09/03/2016  . Brief psychotic disorder ([place]) 08/29/2016  . Cannabis use disorder, mild, abuse 08/29/2016  . Pulmonary emboli ([place]) 07/13/2016  . Adjustment disorder with emotional disturbance 03/12/2014  . Pulmonary embolism and infarction ([place]) 01/02/2014  . Acute left flank pain 01/02/2014  . Pulmonary embolism, bilateral ([place]) 01/02/2014    History reviewed. No pertinent surgical history.     Family History  Problem Relation Age of Onset  . Hypertension Mother     Social History   Tobacco Use  . Smoking status: Current Every Day Smoker    Packs/day: 0.50    Types: Cigarettes  . Smokeless tobacco: Never Used  Substance Use Topics  . Alcohol use: Yes    Comment: BAC not available  . Drug use: Yes    Types: Marijuana    Comment: Last used: 2 months ago UDS NA    Home Medications Prior to Admission medications   Medication Sig Start Date End Date Taking? Authorizing Provider    OLANZapine (ZYPREXA) 2.5 MG tablet Take 1 tablet (2.5 mg total) by mouth at bedtime. 12/09/19   Mesner, Corene Cornea, MD  risperiDONE (RISPERDAL) 1 MG tablet Take 1 tablet by mouth each morning.  Take 3 tablets by mouth at bedtime. 01/20/20   Montine Circle, PA-C  amantadine (SYMMETREL) 100 MG capsule Take 1 capsule (100 mg total) by mouth 2 (two) times daily. Patient not taking: Reported on 11/04/2019 09/01/19 11/17/19  Mallie Darting, NP    Allergies    Haldol [haloperidol]  Review of Systems   Review of Systems  All other systems reviewed and are negative.   Physical Exam Updated Vital Signs BP 121/79 (BP Location: Left Arm)   Pulse 84   Temp (!) 97.3 F (36.3 C) (Oral)   Ht 5\' 11"  (1.803 m)   Wt 99.8 kg   SpO2 98%   BMI 30.68 kg/m   Physical Exam Vitals and nursing note reviewed.  Constitutional:      Appearance: Normal appearance.  HENT:     Head: Normocephalic and atraumatic.     Right Ear: External ear normal.     Left Ear: External ear normal.     Nose: Nose normal.     Mouth/Throat:     Mouth: Mucous membranes are moist.     Pharynx: Oropharynx is clear.  Eyes:     Extraocular  Movements: Extraocular movements intact.     Conjunctiva/sclera: Conjunctivae normal.     Pupils: Pupils are equal, round, and reactive to light.  Cardiovascular:     Rate and Rhythm: Normal rate and regular rhythm.     Pulses: Normal pulses.     Heart sounds: Normal heart sounds.  Pulmonary:     Effort: Pulmonary effort is normal.     Breath sounds: Normal breath sounds.  Abdominal:     General: Abdomen is flat. Bowel sounds are normal.     Palpations: Abdomen is soft.  Musculoskeletal:        General: Normal range of motion.     Cervical back: Normal range of motion and neck supple.  Skin:    General: Skin is warm.     Capillary Refill: Capillary refill takes less than 2 seconds.  Neurological:     General: No focal deficit present.     Mental Status: He is alert and oriented to  person, place, and time.  Psychiatric:        Mood and Affect: Mood normal.        Behavior: Behavior normal.     ED Results / Procedures / Treatments   Labs (all labs ordered are listed, but only abnormal results are displayed) Labs Reviewed - No data to display  EKG None  Radiology No results found.  Procedures Procedures (including critical care time)  Medications Ordered in ED Medications  risperiDONE (RISPERDAL) tablet 1 mg (has no administration in time range)    ED Course  I have reviewed the triage vital signs and the nursing notes.  Pertinent labs & imaging results that were available during my care of the patient were reviewed by me and considered in my medical decision making (see chart for details).    MDM Rules/Calculators/A&P                      Pt will be given 1 dose of Risperdal in ED.  He said he does not want a refill; he will get it from [place] later today.  Final Clinical Impression(s) / ED Diagnoses Final diagnoses:  Schizophrenia, unspecified type [facility])    Rx / DC Orders ED Discharge Orders    None       Jacalyn Lefevre, MD 01/30/20 [phone]

## 2020-02-03 ENCOUNTER — Other Ambulatory Visit: Payer: Self-pay

## 2020-02-03 ENCOUNTER — Emergency Department (HOSPITAL_COMMUNITY)
Admission: EM | Admit: 2020-02-03 | Discharge: 2020-02-04 | Disposition: A | Discharge status: Home / Self Care | Payer: Self-pay | Attending: Emergency Medicine | Admitting: Emergency Medicine

## 2020-02-03 ENCOUNTER — Encounter (HOSPITAL_COMMUNITY): Payer: Self-pay | Admitting: Emergency Medicine

## 2020-02-03 DIAGNOSIS — Z79899 Other long term (current) drug therapy: Secondary | ICD-10-CM | POA: Insufficient documentation

## 2020-02-03 DIAGNOSIS — F17210 Nicotine dependence, cigarettes, uncomplicated: Secondary | ICD-10-CM | POA: Insufficient documentation

## 2020-02-03 DIAGNOSIS — R443 Hallucinations, unspecified: Secondary | ICD-10-CM | POA: Insufficient documentation

## 2020-02-03 DIAGNOSIS — Z590 Homelessness: Secondary | ICD-10-CM | POA: Insufficient documentation

## 2020-02-03 MED ORDER — RISPERIDONE 1 MG PO TABS
1.0000 mg | ORAL_TABLET | Freq: Once | ORAL | Status: AC
  Administered 2020-02-04: 1 mg via ORAL
  Filled 2020-02-03: qty 1

## 2020-02-03 NOTE — ED Triage Notes (Signed)
Patient is stating he is hearing voices and he is out of his medications. Patient is not SI/HI.

## 2020-02-03 NOTE — ED Notes (Signed)
Pt noted to have several [employer] bags full of food that he was eating while grabbing his belongings. Pt continued to eat before ambulating with steady gait to [address].

## 2020-02-03 NOTE — ED Provider Notes (Signed)
[facility] DEPT Provider Note  CSN: [id number] Arrival date & time: 02/03/20 2328  Chief Complaint(s) Hallucinations  HPI Cole Barrett is a 34 y.o. male here for hallucinations. Did not make it to his appointment for his monthly supply of Risperidal. Has no other complaints.  HPI  Past Medical History Past Medical History:  Diagnosis Date  . Homelessness   . Hypercholesterolemia   . Mental disorder   . Paranoid behavior ([place])   . PE (pulmonary embolism)   . Schizophrenia [facility])    Patient Active Problem List   Diagnosis Date Noted  . Acute psychosis ([place]) 08/31/2019  . Schizophrenia ([place]) 08/31/2019  . Schizoaffective disorder, bipolar type ([place]) 01/19/2019  . Alcohol abuse   . Auditory hallucinations   . Alcohol abuse with alcohol-induced mood disorder ([place]) 09/08/2016  . Homelessness 09/03/2016  . Person feigning illness 09/03/2016  . Brief psychotic disorder ([place]) 08/29/2016  . Cannabis use disorder, mild, abuse 08/29/2016  . Pulmonary emboli ([place]) 07/13/2016  . Adjustment disorder with emotional disturbance 03/12/2014  . Pulmonary embolism and infarction ([place]) 01/02/2014  . Acute left flank pain 01/02/2014  . Pulmonary embolism, bilateral ([place]) 01/02/2014   Home Medication(s) Prior to Admission medications   Medication Sig Start Date End Date Taking? Authorizing Provider  OLANZapine (ZYPREXA) 2.5 MG tablet Take 1 tablet (2.5 mg total) by mouth at bedtime. 12/09/19   Mesner, Corene Cornea, MD  risperiDONE (RISPERDAL) 1 MG tablet Take 1 tablet by mouth each morning.  Take 3 tablets by mouth at bedtime. 01/20/20   Montine Circle, PA-C  amantadine (SYMMETREL) 100 MG capsule Take 1 capsule (100 mg total) by mouth 2 (two) times daily. Patient not taking: Reported on 11/04/2019 09/01/19 11/17/19  Mallie Darting, NP      Past Surgical History History reviewed. No pertinent surgical history. Family History Family History  Problem Relation Age of Onset  . Hypertension Mother     Social History Social History   Tobacco Use  . Smoking status: Current Every Day Smoker    Packs/day: 0.50    Types: Cigarettes  . Smokeless tobacco: Never Used  Substance Use Topics  . Alcohol use: Yes    Comment: BAC not available  . Drug use: Yes    Types: Marijuana    Comment: Last used: 2 months ago UDS NA   Allergies Haldol [haloperidol]  Review of Systems Review of Systems All other systems are reviewed and are negative for acute change except as noted in the HPI  Physical Exam Vital Signs  I have reviewed the triage vital signs BP 135/81 (BP Location: Left Arm)   Pulse 86   Temp 98 F (36.7 C) (Oral)   Resp 16   Ht 5\' 10"  (1.778 m)   Wt 99.8 kg   SpO2 99%   BMI 31.57 kg/m   Physical Exam Vitals reviewed.  Constitutional:      General: He is not in acute distress.    Appearance: He is well-developed. He is not diaphoretic.  HENT:     Head: Normocephalic and atraumatic.     Jaw: No trismus.     Right Ear: External ear normal.     Left Ear: External ear normal.     Nose: Nose normal.  Eyes:     General: No scleral icterus.    Conjunctiva/sclera: Conjunctivae normal.  Neck:     Trachea: Phonation normal.  Cardiovascular:     Rate and Rhythm: Normal rate and regular rhythm.  Pulmonary:     Effort: Pulmonary effort is normal. No respiratory distress.     Breath sounds: No stridor.  Abdominal:     General: There is no distension.  Musculoskeletal:        General: Normal range of motion.     Cervical back: Normal range of motion.  Neurological:     Mental Status: He is alert and oriented to person, place, and time.  Psychiatric:        Behavior: Behavior normal.     ED Results and Treatments Labs (all labs ordered are listed, but only abnormal results are displayed) Labs  Reviewed - No data to display                                                                                                                       EKG  EKG Interpretation  Date/Time:    Ventricular Rate:    PR Interval:    QRS Duration:   QT Interval:    QTC Calculation:   R Axis:     Text Interpretation:        Radiology No results found.  Pertinent labs & imaging results that were available during my care of the patient were reviewed by me and considered in my medical decision making (see chart for details).  Medications Ordered in ED Medications  risperiDONE (RISPERDAL) tablet 1 mg (has no administration in time range)                                                                                                                                    Procedures Procedures  (including critical care time)  Medical Decision Making / ED Course I have reviewed the nursing notes for this encounter and the patient's prior records (if available in EHR or on provided paperwork).   Cole Barrett was evaluated in Emergency Department on 02/04/2020 for the symptoms described in the history of present illness. He was evaluated in the context of the global COVID-19 pandemic, which necessitated consideration that the patient might be at risk for infection with the SARS-CoV-2 virus that causes COVID-19. Institutional protocols and algorithms that pertain to the evaluation of patients at risk for COVID-19 are in a state of rapid change based on information released by regulatory bodies including the CDC and federal and state organizations. These policies and algorithms were followed during the patient's care in the ED.  Given single dose of risperidal here. Instructed to get his monthly supply.      Final Clinical Impression(s) / ED Diagnoses Final diagnoses:  Hallucinations   The patient appears reasonably screened and/or stabilized for discharge and I doubt any other medical  condition or other [facility] requiring further screening, evaluation, or treatment in the ED at this time prior to discharge. Safe for discharge with strict return precautions.  Disposition: Discharge  Condition: Good  I have discussed the results, Dx and Tx plan with the patient/family who expressed understanding and agree(s) with the plan. Discharge instructions discussed at length. The patient/family was given strict return precautions who verbalized understanding of the instructions. No further questions at time of discharge.    ED Discharge Orders    None       Follow Up: [address] [place] [facility] [place] [facility] [place] [address] [phone]  Go to  for your medication      This chart was dictated using voice recognition software.  Despite best efforts to proofread,  errors can occur which can change the documentation meaning.   [name], MD 02/04/20 0001

## 2020-02-04 NOTE — ED Notes (Signed)
[place] signature pad unavailable to hallway beds. Patient verbalized understanding of discharge instructions.

## 2020-02-15 ENCOUNTER — Emergency Department (HOSPITAL_COMMUNITY)
Admission: EM | Admit: 2020-02-15 | Discharge: 2020-02-16 | Disposition: A | Discharge status: Home / Self Care | Payer: Self-pay | Attending: Emergency Medicine | Admitting: Emergency Medicine

## 2020-02-15 ENCOUNTER — Other Ambulatory Visit: Payer: Self-pay

## 2020-02-15 ENCOUNTER — Encounter (HOSPITAL_COMMUNITY): Payer: Self-pay | Admitting: Emergency Medicine

## 2020-02-15 DIAGNOSIS — F17210 Nicotine dependence, cigarettes, uncomplicated: Secondary | ICD-10-CM | POA: Insufficient documentation

## 2020-02-15 DIAGNOSIS — Z79899 Other long term (current) drug therapy: Secondary | ICD-10-CM | POA: Insufficient documentation

## 2020-02-15 DIAGNOSIS — R440 Auditory hallucinations: Secondary | ICD-10-CM | POA: Insufficient documentation

## 2020-02-15 DIAGNOSIS — F250 Schizoaffective disorder, bipolar type: Secondary | ICD-10-CM | POA: Insufficient documentation

## 2020-02-15 DIAGNOSIS — R443 Hallucinations, unspecified: Secondary | ICD-10-CM

## 2020-02-15 NOTE — ED Triage Notes (Signed)
Patient reports auditory hallucinations today , no SI or HI , non compliant with his psychoactive medications .

## 2020-02-16 LAB — COMPREHENSIVE METABOLIC PANEL
ALT: 42 U/L (ref 0–44)
AST: 27 U/L (ref 15–41)
Albumin: 3.7 g/dL (ref 3.5–5.0)
Alkaline Phosphatase: 42 U/L (ref 38–126)
Anion gap: 12 (ref 5–15)
BUN: 13 mg/dL (ref 6–20)
CO2: 20 mmol/L — ABNORMAL LOW (ref 22–32)
Calcium: 8.6 mg/dL — ABNORMAL LOW (ref 8.9–10.3)
Chloride: 107 mmol/L (ref 98–111)
Creatinine, Ser: 1.05 mg/dL (ref 0.61–1.24)
GFR calc Af Amer: >60 mL/min (ref >60)
GFR calc non Af Amer: >60 mL/min (ref >60)
Glucose, Bld: 125 mg/dL — ABNORMAL HIGH (ref 70–99)
Potassium: 3.6 mmol/L (ref 3.5–5.1)
Sodium: 139 mmol/L (ref 135–145)
Total Bilirubin: 0.4 mg/dL (ref 0.3–1.2)
Total Protein: 6.6 g/dL (ref 6.5–8.1)

## 2020-02-16 LAB — CBC
HCT: 39.3 % (ref 39.0–52.0)
Hemoglobin: 12.6 g/dL — ABNORMAL LOW (ref 13.0–17.0)
MCH: 28.6 pg (ref 26.0–34.0)
MCHC: 32.1 g/dL (ref 30.0–36.0)
MCV: 89.3 fL (ref 80.0–100.0)
Platelets: 308 10*3/uL (ref 150–400)
RBC: 4.4 MIL/uL (ref 4.22–5.81)
RDW: 12.5 % (ref 11.5–15.5)
WBC: 4.7 10*3/uL (ref 4.0–10.5)
nRBC: 0 % (ref 0.0–0.2)

## 2020-02-16 LAB — RAPID URINE DRUG SCREEN, HOSP PERFORMED
Amphetamines: NOT DETECTED
Barbiturates: NOT DETECTED
Benzodiazepines: NOT DETECTED
Cocaine: NOT DETECTED
Opiates: NOT DETECTED
Tetrahydrocannabinol: NOT DETECTED

## 2020-02-16 LAB — ETHANOL: Alcohol, Ethyl (B): 19 mg/dL — ABNORMAL HIGH (ref <10)

## 2020-02-16 MED ORDER — RISPERIDONE 1 MG PO TABS: ORAL_TABLET | ORAL | 1 refills | Status: DC

## 2020-02-16 MED ORDER — RISPERIDONE 1 MG PO TABS
1.0000 mg | ORAL_TABLET | Freq: Once | ORAL | Status: AC
  Administered 2020-02-16: 1 mg via ORAL
  Filled 2020-02-16 (×2): qty 1

## 2020-02-16 NOTE — ED Provider Notes (Signed)
[name] EMERGENCY DEPARTMENT Provider Note   CSN: [id number] Arrival date & time: 02/15/20  2317     History Chief Complaint  Patient presents with  . Hallucinations    Auditory    Cole Barrett is a 34 y.o. male.  Patient seen frequently.  Reports auditory hallucinations today.  Patient checked in on March 20.  Denies any suicidal or homicidal ideation.  Patient just recently seen March 8.  Patient states he does not have his medications appears from the last visit that his medication was filled.  Patient's been able to rest overnight.  Patient now feels better.  No specific complaints currently.        Past Medical History:  Diagnosis Date  . Homelessness   . Hypercholesterolemia   . Mental disorder   . Paranoid behavior ([facility])   . PE (pulmonary embolism)   . Schizophrenia [facility])     Patient Active Problem List   Diagnosis Date Noted  . Acute psychosis (H[facility]) 08/31/2019  . Schizophrenia (H[facility]) 08/31/2019  . Schizoaffective disorder, bipolar type (H[facility]) 01/19/2019  . Alcohol abuse   . Auditory hallucinations   . Alcohol abuse with alcohol-induced mood disorder (HCC) 09/08/2016  . Homelessness 09/03/2016  . Person feigning illness 09/03/2016  . 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 Cannabis use disorder, mild, abuse 08/29/2016  . Pulmonary emboli (HCC) 07/13/2016  . Adjustment disorder with emotional disturbance 03/12/2014  . 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 Acute left flank pain 01/02/2014  . Pulmonary embolism, bilateral (HCC) 01/02/2014    History reviewed. No pertinent surgical history.     Family History  Problem Relation Age of Onset  . Hypertension Mother     Social History   Tobacco Use  . Smoking status: Current Every Day Smoker    Packs/day: 0.50    Types: Cigarettes  . Smokeless tobacco: Never Used  Substance Use Topics  . Alcohol use: Yes    Comment: BAC not available  . Drug  use: Yes    Types: Marijuana    Comment: Last used: 2 months ago UDS NA    Home Medications Prior to Admission medications   Medication Sig Start Date End Date Taking? Authorizing Provider  OLANZapine (ZYPREXA) 2.5 MG tablet Take 1 tablet (2.5 mg total) by mouth at bedtime. 12/09/19   Mesner, Barbara Cower, MD  risperiDONE (RISPERDAL) 1 MG tablet Take 1 tablet by mouth each morning.  Take 3 tablets by mouth at bedtime. 02/16/20   Vanetta Mulders, MD  amantadine (SYMMETREL) 100 MG capsule Take 1 capsule (100 mg total) by mouth 2 (two) times daily. Patient not taking: Reported on 11/04/2019 09/01/19 11/17/19  [name], NP    Allergies    Haldol [haloperidol]  Review of Systems   Review of Systems  Constitutional: Negative for chills and fever.  HENT: Negative for congestion, rhinorrhea and sore throat.   Eyes: Negative for visual disturbance.  Respiratory: Negative for cough and shortness of breath.   Cardiovascular: Negative for chest pain and leg swelling.  Gastrointestinal: Negative for abdominal pain, diarrhea, nausea and vomiting.  Genitourinary: Negative for dysuria.  Musculoskeletal: Negative for back pain and neck pain.  Skin: Negative for rash.  Neurological: Negative for dizziness, light-headedness and headaches.  Hematological: Does not bruise/bleed easily.  Psychiatric/Behavioral: Positive for hallucinations. Negative for confusion and suicidal ideas.    Physical Exam Updated Vital Signs BP (!) 141/88 (BP Location: Left Arm)   Pulse 84  Temp 97.7 F (36.5 C) (Oral)   Resp 17   Ht 1.778 m (5\' 10" )   Wt 105 kg   SpO2 99%   BMI 33.21 kg/m   Physical Exam Vitals and nursing note reviewed.  Constitutional:      Appearance: He is well-developed.  HENT:     Head: Normocephalic and atraumatic.  Eyes:     Extraocular Movements: Extraocular movements intact.     Conjunctiva/sclera: Conjunctivae normal.     Pupils: Pupils are equal, round, and reactive to light.    Cardiovascular:     Rate and Rhythm: Normal rate and regular rhythm.     Heart sounds: No murmur.  Pulmonary:     Effort: Pulmonary effort is normal. No respiratory distress.     Breath sounds: Normal breath sounds.  Abdominal:     Palpations: Abdomen is soft.     Tenderness: There is no abdominal tenderness.  Musculoskeletal:     Cervical back: Neck supple.  Skin:    General: Skin is warm and dry.  Neurological:     General: No focal deficit present.     Mental Status: He is alert and oriented to person, place, and time.     Cranial Nerves: No cranial nerve deficit.     Sensory: No sensory deficit.     Motor: No weakness.     ED Results / Procedures / Treatments   Labs (all labs ordered are listed, but only abnormal results are displayed) Labs Reviewed  COMPREHENSIVE METABOLIC PANEL - Abnormal; Notable for the following components:      Result Value   CO2 20 (*)    Glucose, Bld 125 (*)    Calcium 8.6 (*)    All other components within normal limits  ETHANOL - Abnormal; Notable for the following components:   Alcohol, Ethyl (B) 19 (*)    All other components within normal limits  CBC - Abnormal; Notable for the following components:   Hemoglobin 12.6 (*)    All other components within normal limits  RAPID URINE DRUG SCREEN, HOSP PERFORMED    EKG None  Radiology No results found.  Procedures Procedures (including critical care time)  Medications Ordered in ED Medications  risperiDONE (RISPERDAL) tablet 1 mg (has no administration in time range)    ED Course  I have reviewed the triage vital signs and the nursing notes.  Pertinent labs & imaging results that were available during my care of the patient were reviewed by me and considered in my medical decision making (see chart for details).    MDM Rules/Calculators/A&P                      Patient without any suicidal or homicidal ideation.  Patient without any evidence of any significant psychosis  currently.  We will give him his morning respite all dose.  He states he needs a new prescription for his regular respite all medicine.  That is provided.  Not sure why did not get the one filled that he is had in the past.  But will provide another 1.  Patient does not require behavioral health consultation at this time.   Final Clinical Impression(s) / ED Diagnoses Final diagnoses:  Hallucinations    Rx / DC Orders ED Discharge Orders         Ordered    risperiDONE (RISPERDAL) 1 MG tablet     02/16/20 0731  Vanetta Mulders, MD 02/16/20 [phone]

## 2020-02-16 NOTE — ED Notes (Signed)
Belongings given to pt

## 2020-02-16 NOTE — Discharge Instructions (Signed)
Take your respite all as directed.  New prescription provided.  Take 1 tablet each morning and then take 3 tablets by mouth at bedtime.  We will give you your morning dose here now.  Follow-up with behavioral health.

## 2020-02-16 NOTE — ED Notes (Signed)
Pt discharge instructions and prescription reviewed with the patient. The patient verbalized understanding of both.

## 2020-02-16 NOTE — ED Notes (Signed)
BELONGINGS ARE IN LOCKER #4

## 2020-02-16 NOTE — ED Notes (Signed)
Pt discharged

## 2020-02-26 ENCOUNTER — Encounter (HOSPITAL_COMMUNITY): Payer: Self-pay | Admitting: Emergency Medicine

## 2020-02-26 ENCOUNTER — Emergency Department (HOSPITAL_COMMUNITY)
Admission: EM | Admit: 2020-02-26 | Discharge: 2020-02-26 | Disposition: A | Discharge status: Home / Self Care | Payer: Self-pay | Attending: Emergency Medicine | Admitting: Emergency Medicine

## 2020-02-26 ENCOUNTER — Other Ambulatory Visit: Payer: Self-pay

## 2020-02-26 DIAGNOSIS — Z79899 Other long term (current) drug therapy: Secondary | ICD-10-CM | POA: Insufficient documentation

## 2020-02-26 DIAGNOSIS — R440 Auditory hallucinations: Secondary | ICD-10-CM | POA: Insufficient documentation

## 2020-02-26 DIAGNOSIS — F250 Schizoaffective disorder, bipolar type: Secondary | ICD-10-CM | POA: Insufficient documentation

## 2020-02-26 DIAGNOSIS — F17210 Nicotine dependence, cigarettes, uncomplicated: Secondary | ICD-10-CM | POA: Insufficient documentation

## 2020-02-26 LAB — RAPID URINE DRUG SCREEN, HOSP PERFORMED
Amphetamines: NOT DETECTED
Barbiturates: NOT DETECTED
Benzodiazepines: NOT DETECTED
Cocaine: NOT DETECTED
Opiates: NOT DETECTED
Tetrahydrocannabinol: NOT DETECTED

## 2020-02-26 LAB — CBC
HCT: 39.7 % (ref 39.0–52.0)
Hemoglobin: 12.7 g/dL — ABNORMAL LOW (ref 13.0–17.0)
MCH: 28.3 pg (ref 26.0–34.0)
MCHC: 32 g/dL (ref 30.0–36.0)
MCV: 88.6 fL (ref 80.0–100.0)
Platelets: 308 10*3/uL (ref 150–400)
RBC: 4.48 MIL/uL (ref 4.22–5.81)
RDW: 12.6 % (ref 11.5–15.5)
WBC: 4.7 10*3/uL (ref 4.0–10.5)
nRBC: 0 % (ref 0.0–0.2)

## 2020-02-26 LAB — COMPREHENSIVE METABOLIC PANEL
ALT: 33 U/L (ref 0–44)
AST: 27 U/L (ref 15–41)
Albumin: 3.8 g/dL (ref 3.5–5.0)
Alkaline Phosphatase: 37 U/L — ABNORMAL LOW (ref 38–126)
Anion gap: 12 (ref 5–15)
BUN: 15 mg/dL (ref 6–20)
CO2: 23 mmol/L (ref 22–32)
Calcium: 8.8 mg/dL — ABNORMAL LOW (ref 8.9–10.3)
Chloride: 102 mmol/L (ref 98–111)
Creatinine, Ser: 1.24 mg/dL (ref 0.61–1.24)
GFR calc Af Amer: >60 mL/min (ref >60)
GFR calc non Af Amer: >60 mL/min (ref >60)
Glucose, Bld: 120 mg/dL — ABNORMAL HIGH (ref 70–99)
Potassium: 4 mmol/L (ref 3.5–5.1)
Sodium: 137 mmol/L (ref 135–145)
Total Bilirubin: 0.4 mg/dL (ref 0.3–1.2)
Total Protein: 6.7 g/dL (ref 6.5–8.1)

## 2020-02-26 LAB — ETHANOL: Alcohol, Ethyl (B): <10 mg/dL (ref <10)

## 2020-02-26 LAB — SALICYLATE LEVEL: Salicylate Lvl: <7 mg/dL — ABNORMAL LOW (ref 7.0–30.0)

## 2020-02-26 LAB — ACETAMINOPHEN LEVEL: Acetaminophen (Tylenol), Serum: <10 ug/mL — ABNORMAL LOW (ref 10–30)

## 2020-02-26 MED ORDER — RISPERIDONE 1 MG PO TABS
1.0000 mg | ORAL_TABLET | Freq: Once | ORAL | Status: AC
  Administered 2020-02-26: 1 mg via ORAL
  Filled 2020-02-26 (×2): qty 1

## 2020-02-26 NOTE — ED Provider Notes (Signed)
[name] EMERGENCY DEPARTMENT Provider Note   CSN: [id number] Arrival date & time: 02/26/20  0009     History Chief Complaint  Patient presents with  . Hallucinations    Auditory    Cole Barrett is a 34 y.o. male.  The history is provided by the patient.  Mental Health Problem Presenting symptoms: hallucinations   Presenting symptoms: no self-mutilation   Degree of incapacity (severity):  Moderate Onset quality:  Gradual Timing:  Constant Progression:  Unchanged Chronicity:  Chronic Context: not alcohol use   Treatment compliance:  Some of the time Relieved by:  Nothing Worsened by:  Nothing Ineffective treatments:  None tried Associated symptoms: no abdominal pain, no anhedonia and no chest pain   Risk factors: no family hx of mental illness   Patient is here with auditory hallucinations.  No SI nor HI no AH no VH.       Past Medical History:  Diagnosis Date  . Homelessness   . Hypercholesterolemia   . Mental disorder   . Paranoid behavior (H[facility])   . PE (pulmonary embolism)   . Schizophrenia [facility])     Patient Active Problem List   Diagnosis Date Noted  . Acute psychosis (HCC) 08/31/2019  . Schizophrenia (HCC) 08/31/2019  . Schizoaffective disorder, bipolar type (HCC) 01/19/2019  . Alcohol abuse   . Auditory hallucinations   . Alcohol abuse with alcohol-induced mood disorder (HCC) 09/08/2016  . Homelessness 09/03/2016  . Person feigning illness 09/03/2016  . 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 Cannabis use disorder, mild, abuse 08/29/2016  . Pulmonary emboli (HCC) 07/13/2016  . Adjustment disorder with emotional disturbance 03/12/2014  . 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 Acute left flank pain 01/02/2014  . Pulmonary embolism, bilateral (HCC) 01/02/2014    History reviewed. No pertinent surgical history.     Family History  Problem Relation Age of Onset  . Hypertension Mother      Social History   Tobacco Use  . Smoking status: Current Every Day Smoker    Packs/day: 0.50    Types: Cigarettes  . Smokeless tobacco: Never Used  Substance Use Topics  . Alcohol use: Yes    Comment: BAC not available  . Drug use: Yes    Types: Marijuana    Comment: Last used: 2 months ago UDS NA    Home Medications Prior to Admission medications   Medication Sig Start Date End Date Taking? Authorizing Provider  OLANZapine (ZYPREXA) 2.5 MG tablet Take 1 tablet (2.5 mg total) by mouth at bedtime. 12/09/19   Mesner, Barbara Cower, MD  risperiDONE (RISPERDAL) 1 MG tablet Take 1 tablet by mouth each morning.  Take 3 tablets by mouth at bedtime. 02/16/20   Vanetta Mulders, MD  amantadine (SYMMETREL) 100 MG capsule Take 1 capsule (100 mg total) by mouth 2 (two) times daily. Patient not taking: Reported on 11/04/2019 09/01/19 11/17/19  [name], NP    Allergies    Haldol [haloperidol]  Review of Systems   Review of Systems  Constitutional: Negative for fever.  HENT: Negative for congestion.   Eyes: Negative for visual disturbance.  Respiratory: Negative for shortness of breath.   Cardiovascular: Negative for chest pain.  Gastrointestinal: Negative for abdominal pain.  Genitourinary: Negative for difficulty urinating.  Musculoskeletal: Negative for arthralgias.  Neurological: Negative for dizziness.  Psychiatric/Behavioral: Positive for hallucinations. Negative for self-injury and sleep disturbance.  All other systems reviewed and are negative.   Physical Exam Updated  Vital Signs BP 128/77 (BP Location: Left Arm)   Pulse 78   Temp 98.2 F (36.8 C) (Oral)   Resp 20   Ht 5\' 8"  (1.727 m)   Wt 80 kg   SpO2 98%   BMI 26.82 kg/m   Physical Exam Vitals and nursing note reviewed.  Constitutional:      General: He is not in acute distress.    Appearance: Normal appearance.  HENT:     Head: Normocephalic and atraumatic.     Nose: Nose normal.  Eyes:      Conjunctiva/sclera: Conjunctivae normal.     Pupils: Pupils are equal, round, and reactive to light.  Cardiovascular:     Rate and Rhythm: Normal rate and regular rhythm.     Pulses: Normal pulses.     Heart sounds: Normal heart sounds.  Pulmonary:     Effort: Pulmonary effort is normal.     Breath sounds: Normal breath sounds.  Abdominal:     General: Abdomen is flat. Bowel sounds are normal.     Tenderness: There is no abdominal tenderness. There is no guarding.  Musculoskeletal:        General: Normal range of motion.     Cervical back: Normal range of motion and neck supple.  Skin:    General: Skin is warm and dry.     Capillary Refill: Capillary refill takes less than 2 seconds.  Neurological:     General: No focal deficit present.     Mental Status: He is alert and oriented to person, place, and time.     Deep Tendon Reflexes: Reflexes normal.  Psychiatric:        Mood and Affect: Mood normal.        Behavior: Behavior normal.        Thought Content: Thought content normal. Thought content does not include homicidal or suicidal plan.     ED Results / Procedures / Treatments   Labs (all labs ordered are listed, but only abnormal results are displayed) Results for orders placed or performed during the hospital encounter of 02/26/20  Comprehensive metabolic panel  Result Value Ref Range   Sodium 137 135 - 145 mmol/L   Potassium 4.0 3.5 - 5.1 mmol/L   Chloride 102 98 - 111 mmol/L   CO2 23 22 - 32 mmol/L   Glucose, Bld 120 (H) 70 - 99 mg/dL   BUN 15 6 - 20 mg/dL   Creatinine, Ser 1.24 0.61 - 1.24 mg/dL   Calcium 8.8 (L) 8.9 - 10.3 mg/dL   Total Protein 6.7 6.5 - 8.1 g/dL   Albumin 3.8 3.5 - 5.0 g/dL   AST 27 15 - 41 U/L   ALT 33 0 - 44 U/L   Alkaline Phosphatase 37 (L) 38 - 126 U/L   Total Bilirubin 0.4 0.3 - 1.2 mg/dL   GFR calc non Af Amer >60 >60 mL/min   GFR calc Af Amer >60 >60 mL/min   Anion gap 12 5 - 15  Ethanol  Result Value Ref Range   Alcohol, Ethyl  (B) <44 <03 mg/dL  Salicylate level  Result Value Ref Range   Salicylate Lvl <4.7 (L) 7.0 - 30.0 mg/dL  Acetaminophen level  Result Value Ref Range   Acetaminophen (Tylenol), Serum <10 (L) 10 - 30 ug/mL  cbc  Result Value Ref Range   WBC 4.7 4.0 - 10.5 K/uL   RBC 4.48 4.22 - 5.81 MIL/uL   Hemoglobin 12.7 (L) 13.0 - 17.0  g/dL   HCT 02.5 85.2 - 77.8 %   MCV 88.6 80.0 - 100.0 fL   MCH 28.3 26.0 - 34.0 pg   MCHC 32.0 30.0 - 36.0 g/dL   RDW 24.2 35.3 - 61.4 %   Platelets 308 150 - 400 K/uL   nRBC 0.0 0.0 - 0.2 %  Rapid urine drug screen (hospital performed)  Result Value Ref Range   Opiates NONE DETECTED NONE DETECTED   Cocaine NONE DETECTED NONE DETECTED   Benzodiazepines NONE DETECTED NONE DETECTED   Amphetamines NONE DETECTED NONE DETECTED   Tetrahydrocannabinol NONE DETECTED NONE DETECTED   Barbiturates NONE DETECTED NONE DETECTED   No results found.  Radiology No results found.  Procedures Procedures (including critical care time)  Medications Ordered in ED Medications - No data to display  ED Course  I have reviewed the triage vital signs and the nursing notes.  Pertinent labs & imaging results that were available during my care of the patient were reviewed by me and considered in my medical decision making (see chart for details).    No indication for admission.  No SI no HI.    Cole Barrett was evaluated in Emergency Department on 02/26/2020 for the symptoms described in the history of present illness. He was evaluated in the context of the global COVID-19 pandemic, which necessitated consideration that the patient might be at risk for infection with the SARS-CoV-2 virus that causes COVID-19. Institutional protocols and algorithms that pertain to the evaluation of patients at risk for COVID-19 are in a state of rapid change based on information released by regulatory bodies including the CDC and federal and state organizations. These policies and  algorithms were followed during the patient's care in the ED. Final Clinical Impression(s) / ED Diagnoses Return for weakness, numbness, changes in vision or speech, fevers >100.4 unrelieved by medication, shortness of breath, intractable vomiting, or diarrhea, abdominal pain, Inability to tolerate liquids or food, cough, altered mental status or any concerns. No signs of systemic illness or infection. The patient is nontoxic-appearing on exam and vital signs are within normal limits.   I have reviewed the triage vital signs and the nursing notes. Pertinent labs &imaging results that were available during my care of the patient were reviewed by me and considered in my medical decision making (see chart for details).  After history, exam, and medical workup I feel the patient has been appropriately medically screened and is safe for discharge home. Pertinent diagnoses were discussed with the patient. Patient was givenstrictreturn precautions.   Shyanne Mcclary, MD 02/26/20 [phone]

## 2020-02-26 NOTE — ED Triage Notes (Signed)
Patient reports auditory hallucinations today , denies SI or HI .  

## 2020-03-15 ENCOUNTER — Emergency Department (HOSPITAL_COMMUNITY)
Admission: EM | Admit: 2020-03-15 | Discharge: 2020-03-15 | Disposition: A | Discharge status: Home / Self Care | Payer: Self-pay | Attending: Emergency Medicine | Admitting: Emergency Medicine

## 2020-03-15 ENCOUNTER — Encounter (HOSPITAL_COMMUNITY): Payer: Self-pay | Admitting: Emergency Medicine

## 2020-03-15 ENCOUNTER — Other Ambulatory Visit: Payer: Self-pay

## 2020-03-15 DIAGNOSIS — R443 Hallucinations, unspecified: Secondary | ICD-10-CM | POA: Insufficient documentation

## 2020-03-15 DIAGNOSIS — Z79899 Other long term (current) drug therapy: Secondary | ICD-10-CM | POA: Insufficient documentation

## 2020-03-15 DIAGNOSIS — Z590 Homelessness: Secondary | ICD-10-CM | POA: Insufficient documentation

## 2020-03-15 DIAGNOSIS — F17210 Nicotine dependence, cigarettes, uncomplicated: Secondary | ICD-10-CM | POA: Insufficient documentation

## 2020-03-15 LAB — COMPREHENSIVE METABOLIC PANEL
ALT: 54 U/L — ABNORMAL HIGH (ref 0–44)
AST: 32 U/L (ref 15–41)
Albumin: 4.1 g/dL (ref 3.5–5.0)
Alkaline Phosphatase: 42 U/L (ref 38–126)
Anion gap: 12 (ref 5–15)
BUN: 7 mg/dL (ref 6–20)
CO2: 21 mmol/L — ABNORMAL LOW (ref 22–32)
Calcium: 8.7 mg/dL — ABNORMAL LOW (ref 8.9–10.3)
Chloride: 104 mmol/L (ref 98–111)
Creatinine, Ser: 0.92 mg/dL (ref 0.61–1.24)
GFR calc Af Amer: >60 mL/min (ref >60)
GFR calc non Af Amer: >60 mL/min (ref >60)
Glucose, Bld: 124 mg/dL — ABNORMAL HIGH (ref 70–99)
Potassium: 3.5 mmol/L (ref 3.5–5.1)
Sodium: 137 mmol/L (ref 135–145)
Total Bilirubin: 0.7 mg/dL (ref 0.3–1.2)
Total Protein: 7.4 g/dL (ref 6.5–8.1)

## 2020-03-15 LAB — RAPID URINE DRUG SCREEN, HOSP PERFORMED
Amphetamines: NOT DETECTED
Barbiturates: NOT DETECTED
Benzodiazepines: NOT DETECTED
Cocaine: NOT DETECTED
Opiates: NOT DETECTED
Tetrahydrocannabinol: NOT DETECTED

## 2020-03-15 LAB — CBC
HCT: 42.1 % (ref 39.0–52.0)
Hemoglobin: 13.3 g/dL (ref 13.0–17.0)
MCH: 27.7 pg (ref 26.0–34.0)
MCHC: 31.6 g/dL (ref 30.0–36.0)
MCV: 87.7 fL (ref 80.0–100.0)
Platelets: 315 10*3/uL (ref 150–400)
RBC: 4.8 MIL/uL (ref 4.22–5.81)
RDW: 12.6 % (ref 11.5–15.5)
WBC: 3.9 10*3/uL — ABNORMAL LOW (ref 4.0–10.5)
nRBC: 0 % (ref 0.0–0.2)

## 2020-03-15 LAB — ETHANOL: Alcohol, Ethyl (B): 79 mg/dL — ABNORMAL HIGH (ref <10)

## 2020-03-15 MED ORDER — RISPERIDONE 1 MG PO TABS
1.0000 mg | ORAL_TABLET | Freq: Once | ORAL | Status: AC
  Administered 2020-03-15: 1 mg via ORAL
  Filled 2020-03-15: qty 1

## 2020-03-15 NOTE — Discharge Instructions (Addendum)
Please follow-up with [facility] for ongoing evaluation and management of your chronic auditory hallucinations.  Please return to the ED or seek immediate medical attention for any new or worsening symptoms.

## 2020-03-15 NOTE — ED Provider Notes (Signed)
[facility] EMERGENCY DEPARTMENT Provider Note   CSN: [id number] Arrival date & time: 03/15/20  0030     History Chief Complaint  Patient presents with  . Hallucinations    Cole Barrett is a 34 y.o. male with past medical history significant for paranoid depression, homelessness, and chronic auditory hallucinations who presents to the ED for medication management.  Patient is here requesting his daytime 1 mg Risperdal dose.  Patient reports that he is still hearing voices and that they are "loud".  He denies any angry or directive voices.  No visual hallucinations.  He denies any depression, SI, HI, alcohol use, or illicit drug use aside from intermittent marijuana.  Patient denies any recent illness, fevers or chills, chest pain or difficulty breathing, abdominal pain, nausea or vomiting, or other symptoms.  He states that he has been working "moving boxes".  When asked about his living situation, he states that he has been at his home and that "it's all straight".      Patient reports that he was in the ED Thursday evening to receive his 3 mg Risperdal dose and failed to reach out to [facility] yesterday for medication refill.  For that reason, he was unable to take his 3 mg Risperdal dose last evening and is requesting it today.  He reports that if he receives a 3 mg dose this morning in the ED he "should be straight" until he can follow-up with [facility] on Monday 12/09/2019.  His typical regimen is Risperdal 1 mg p.o. morning and 3 mg p.o. nightly.  This is the patient's 12th ED encounter for medication management since 11/02/2019.  Currently he is denying any auditory or visual hallucinations, intrusive thoughts, SI or HI, paranoia, or any medical complaints.  He simply wants to ensure that he is receiving is appropriate load of Risperdal.  He reports that he currently has his own apartment.  HPI     Past Medical History:  Diagnosis Date  . Homelessness   .  Hypercholesterolemia   . Mental disorder   . Paranoid behavior ([place])   . PE (pulmonary embolism)   . Schizophrenia [facility])     Patient Active Problem List   Diagnosis Date Noted  . Acute psychosis ([place]) 08/31/2019  . Schizophrenia ([place]) 08/31/2019  . Schizoaffective disorder, bipolar type ([place]) 01/19/2019  . Alcohol abuse   . Auditory hallucinations   . Alcohol abuse with alcohol-induced mood disorder ([place]) 09/08/2016  . Homelessness 09/03/2016  . Person feigning illness 09/03/2016  . Brief psychotic disorder ([place]) 08/29/2016  . Cannabis use disorder, mild, abuse 08/29/2016  . Pulmonary emboli ([place]) 07/13/2016  . Adjustment disorder with emotional disturbance 03/12/2014  . Pulmonary embolism and infarction ([place]) 01/02/2014  . Acute left flank pain 01/02/2014  . Pulmonary embolism, bilateral ([place]) 01/02/2014    History reviewed. No pertinent surgical history.     Family History  Problem Relation Age of Onset  . Hypertension Mother     Social History   Tobacco Use  . Smoking status: Current Every Day Smoker    Packs/day: 0.50    Types: Cigarettes  . Smokeless tobacco: Never Used  Substance Use Topics  . Alcohol use: Yes    Comment: BAC not available  . Drug use: Yes    Types: Marijuana    Comment: Last used: 2 months ago UDS NA    Home Medications Prior to Admission medications   Medication Sig Start Date End Date Taking? Authorizing Provider  OLANZapine (ZYPREXA) 2.5 MG tablet Take 1 tablet (2.5 mg total) by mouth at bedtime. 12/09/19   Mesner, Barbara Cower, MD  risperiDONE (RISPERDAL) 1 MG tablet Take 1 tablet by mouth each morning.  Take 3 tablets by mouth at bedtime. 02/16/20   Vanetta Mulders, MD  amantadine (SYMMETREL) 100 MG capsule Take 1 capsule (100 mg total) by mouth 2 (two) times daily. Patient not taking: Reported on 11/04/2019 09/01/19 11/17/19  [name], NP    Allergies    Haldol [haloperidol]  Review of Systems   Review of Systems  All other  systems reviewed and are negative.   Physical Exam Updated Vital Signs BP 135/85 (BP Location: Right Arm)   Pulse 74   Temp 98.1 F (36.7 C) (Oral)   Resp 14   SpO2 99%   Physical Exam Vitals and nursing note reviewed. Exam conducted with a chaperone present.  Constitutional:      General: He is not in acute distress.    Appearance: Normal appearance. He is not ill-appearing.     Comments: Malodorous.  HENT:     Head: Normocephalic and atraumatic.  Eyes:     General: No scleral icterus.    Conjunctiva/sclera: Conjunctivae normal.  Cardiovascular:     Rate and Rhythm: Normal rate and regular rhythm.     Pulses: Normal pulses.     Heart sounds: Normal heart sounds.  Pulmonary:     Effort: Pulmonary effort is normal. No respiratory distress.     Breath sounds: Normal breath sounds. No wheezing or rales.  Musculoskeletal:     Cervical back: Normal range of motion. No rigidity.  Skin:    General: Skin is dry.     Capillary Refill: Capillary refill takes less than 2 seconds.  Neurological:     Mental Status: He is alert and oriented to person, place, and time.     GCS: GCS eye subscore is 4. GCS verbal subscore is 5. GCS motor subscore is 6.  Psychiatric:        Mood and Affect: Mood normal.        Behavior: Behavior normal.        Thought Content: Thought content normal.     Comments: Calm and pleasant demeanor.  Answering questions appropriately.  Maintains good eye contact.     ED Results / Procedures / Treatments   Labs (all labs ordered are listed, but only abnormal results are displayed) Labs Reviewed  COMPREHENSIVE METABOLIC PANEL - Abnormal; Notable for the following components:      Result Value   CO2 21 (*)    Glucose, Bld 124 (*)    Calcium 8.7 (*)    ALT 54 (*)    All other components within normal limits  ETHANOL - Abnormal; Notable for the following components:   Alcohol, Ethyl (B) 79 (*)    All other components within normal limits  CBC - Abnormal;  Notable for the following components:   WBC 3.9 (*)    All other components within normal limits  RAPID URINE DRUG SCREEN, HOSP PERFORMED    EKG None  Radiology No results found.  Procedures Procedures (including critical care time)  Medications Ordered in ED Medications  risperiDONE (RISPERDAL) tablet 1 mg (has no administration in time range)    ED Course  I have reviewed the triage vital signs and the nursing notes.  Pertinent labs & imaging results that were available during my care of the patient were reviewed by me and  considered in my medical decision making (see chart for details).    MDM Rules/Calculators/A&P                      Patient is here purely for the purposes of medication management.  He is endorsing auditory hallucinations, consistent with his baseline.  He has been taking Risperdal 1 mg each morning and 3 mg each night chronically.  I have personally evaluated him on multiple occasions.  He is always pleasant and he is denying any symptoms or recent ailments.  I asked him when he would be following up with [facility] and he states that he will be there in the next couple of weeks but that he has been "busy with work".  Patient informs me that he has is 3 mg Risperdal for night, but not his 1 mg Risperdal for day.  I asked him if I could refill his prescription and he declined.  He states that he is going to be getting his 1 mg Risperdal tomorrow from the pharmacy.  Patient is denying any angry voices, visual hallucinations, depression, suicidal ideation, homicidal ideation, paranoia, or other concerning psychiatric symptoms at this time.  He does not appear to be acutely psychotic and I do not feel as though inpatient stabilization or TTS evaluation is warranted.  Patient is denying any and all medical complaints and his vital signs are stable and within normal limits.  Emphasized the importance of following up with [facility] as soon as possible for ongoing evaluation  and management of his chronic auditory hallucinations.  He has been evaluated here in the ED 32 times in the past 6 months for similar complaints.   Final Clinical Impression(s) / ED Diagnoses Final diagnoses:  Hallucinations  Medication management    Rx / DC Orders ED Discharge Orders    None       Lorelee New, PA-C 03/15/20 0912    Maia Plan, MD 03/18/20 [phone]

## 2020-03-15 NOTE — ED Notes (Signed)
Patient verbalizes understanding of discharge instructions. Opportunity for questioning and answers were provided. Armband removed by staff, pt discharged from ED.  

## 2020-03-15 NOTE — ED Triage Notes (Signed)
Pt reports auditory hallucination telling him that "they are going to kill me."  Pt denies any SI/HI, appears very sleepy.  Denies all SI and HI.  "I just want to talk to somebody."

## 2020-03-18 ENCOUNTER — Emergency Department (HOSPITAL_COMMUNITY)
Admission: EM | Admit: 2020-03-18 | Discharge: 2020-03-19 | Disposition: A | Discharge status: Home / Self Care | Payer: Self-pay | Attending: Emergency Medicine | Admitting: Emergency Medicine

## 2020-03-18 ENCOUNTER — Other Ambulatory Visit: Payer: Self-pay

## 2020-03-18 ENCOUNTER — Encounter (HOSPITAL_COMMUNITY): Payer: Self-pay | Admitting: Emergency Medicine

## 2020-03-18 DIAGNOSIS — R440 Auditory hallucinations: Secondary | ICD-10-CM | POA: Insufficient documentation

## 2020-03-18 DIAGNOSIS — Z79899 Other long term (current) drug therapy: Secondary | ICD-10-CM | POA: Insufficient documentation

## 2020-03-18 DIAGNOSIS — Z86711 Personal history of pulmonary embolism: Secondary | ICD-10-CM | POA: Insufficient documentation

## 2020-03-18 DIAGNOSIS — F209 Schizophrenia, unspecified: Secondary | ICD-10-CM | POA: Insufficient documentation

## 2020-03-18 DIAGNOSIS — F17210 Nicotine dependence, cigarettes, uncomplicated: Secondary | ICD-10-CM | POA: Insufficient documentation

## 2020-03-18 NOTE — ED Triage Notes (Signed)
Patient reports persistent auditory hallucinations for several days , denies SI or HI , alert and oriented , respirations unlabored , denies pain or discomfort.

## 2020-03-19 LAB — CBC
HCT: 42.8 % (ref 39.0–52.0)
Hemoglobin: 13.8 g/dL (ref 13.0–17.0)
MCH: 27.8 pg (ref 26.0–34.0)
MCHC: 32.2 g/dL (ref 30.0–36.0)
MCV: 86.1 fL (ref 80.0–100.0)
Platelets: 344 10*3/uL (ref 150–400)
RBC: 4.97 MIL/uL (ref 4.22–5.81)
RDW: 12.6 % (ref 11.5–15.5)
WBC: 4.5 10*3/uL (ref 4.0–10.5)
nRBC: 0 % (ref 0.0–0.2)

## 2020-03-19 LAB — RAPID URINE DRUG SCREEN, HOSP PERFORMED
Amphetamines: NOT DETECTED
Barbiturates: NOT DETECTED
Benzodiazepines: NOT DETECTED
Cocaine: NOT DETECTED
Opiates: NOT DETECTED
Tetrahydrocannabinol: NOT DETECTED

## 2020-03-19 LAB — COMPREHENSIVE METABOLIC PANEL
ALT: 48 U/L — ABNORMAL HIGH (ref 0–44)
AST: 28 U/L (ref 15–41)
Albumin: 4.1 g/dL (ref 3.5–5.0)
Alkaline Phosphatase: 51 U/L (ref 38–126)
Anion gap: 9 (ref 5–15)
BUN: 12 mg/dL (ref 6–20)
CO2: 27 mmol/L (ref 22–32)
Calcium: 9.4 mg/dL (ref 8.9–10.3)
Chloride: 101 mmol/L (ref 98–111)
Creatinine, Ser: 1.08 mg/dL (ref 0.61–1.24)
GFR calc Af Amer: >60 mL/min (ref >60)
GFR calc non Af Amer: >60 mL/min (ref >60)
Glucose, Bld: 99 mg/dL (ref 70–99)
Potassium: 3.8 mmol/L (ref 3.5–5.1)
Sodium: 137 mmol/L (ref 135–145)
Total Bilirubin: 0.7 mg/dL (ref 0.3–1.2)
Total Protein: 7.5 g/dL (ref 6.5–8.1)

## 2020-03-19 LAB — ETHANOL: Alcohol, Ethyl (B): <10 mg/dL (ref <10)

## 2020-03-19 MED ORDER — RISPERIDONE 1 MG PO TABS
1.0000 mg | ORAL_TABLET | Freq: Once | ORAL | Status: AC
  Administered 2020-03-19: 1 mg via ORAL
  Filled 2020-03-19: qty 1

## 2020-03-19 NOTE — ED Notes (Signed)
Pt would not wake up for vitals check

## 2020-03-19 NOTE — ED Notes (Signed)
Patient Alert and oriented to baseline. Stable and ambulatory to baseline. Patient verbalized understanding of the discharge instructions.  Patient belongings were taken by the patient.   

## 2020-03-19 NOTE — Discharge Instructions (Signed)
It was our pleasure to provide your ER care today - we hope that you feel better.  For mental health issues and/or crisis, you may go directly to [facility] or [facility].  Return to ER if worse, new symptoms, trouble breathing, or other emergency.

## 2020-03-19 NOTE — ED Provider Notes (Signed)
[facility] EMERGENCY DEPARTMENT Provider Note   CSN: [id number] Arrival date & time: 03/18/20  2217     History Chief Complaint  Patient presents with  . Hallucinations    Cole Barrett is a 34 y.o. male.  Patient with hx schizophrenia indicates intermittently in past months he hears voices. No acute or abrupt change or worsening today, and indicates currently he is not hearing any voices. No visual hallucinations. Denies depression or any thoughts of harm to self or others. He indicates in general he is compliant w his medication, although does request his AM dose of risperdal. Normal appetite. Is eating and drinking per normal. Usually sleeps ok at night, but was in ED last night waiting. He indicates he has plans to follow up at [facility] later today. States physical health at baseline, no acute/new symptoms.   The history is provided by the patient.       Past Medical History:  Diagnosis Date  . Homelessness   . Hypercholesterolemia   . Mental disorder   . Paranoid behavior ([place])   . PE (pulmonary embolism)   . Schizophrenia [facility])     Patient Active Problem List   Diagnosis Date Noted  . Acute psychosis ([place]) 08/31/2019  . Schizophrenia ([place]) 08/31/2019  . Schizoaffective disorder, bipolar type ([place]) 01/19/2019  . Alcohol abuse   . Auditory hallucinations   . Alcohol abuse with alcohol-induced mood disorder ([place]) 09/08/2016  . Homelessness 09/03/2016  . Person feigning illness 09/03/2016  . Brief psychotic disorder ([place]) 08/29/2016  . Cannabis use disorder, mild, abuse 08/29/2016  . Pulmonary emboli ([place]) 07/13/2016  . Adjustment disorder with emotional disturbance 03/12/2014  . Pulmonary embolism and infarction ([place]) 01/02/2014  . Acute left flank pain 01/02/2014  . Pulmonary embolism, bilateral ([place]) 01/02/2014    History reviewed. No pertinent surgical history.     Family History  Problem Relation Age of Onset  .  Hypertension Mother     Social History   Tobacco Use  . Smoking status: Current Every Day Smoker    Packs/day: 0.50    Types: Cigarettes  . Smokeless tobacco: Never Used  Substance Use Topics  . Alcohol use: Yes    Comment: BAC not available  . Drug use: Yes    Types: Marijuana    Comment: Last used: 2 months ago UDS NA    Home Medications Prior to Admission medications   Medication Sig Start Date End Date Taking? Authorizing Provider  OLANZapine (ZYPREXA) 2.5 MG tablet Take 1 tablet (2.5 mg total) by mouth at bedtime. 12/09/19   Mesner, Corene Cornea, MD  risperiDONE (RISPERDAL) 1 MG tablet Take 1 tablet by mouth each morning.  Take 3 tablets by mouth at bedtime. 02/16/20   Fredia Sorrow, MD  amantadine (SYMMETREL) 100 MG capsule Take 1 capsule (100 mg total) by mouth 2 (two) times daily. Patient not taking: Reported on 11/04/2019 09/01/19 11/17/19  Mallie Darting, NP    Allergies    Haldol [haloperidol]  Review of Systems   Review of Systems  Constitutional: Negative for fever.  HENT: Negative for sore throat.   Eyes: Negative for redness.  Respiratory: Negative for cough and shortness of breath.   Cardiovascular: Negative for chest pain.  Gastrointestinal: Negative for abdominal pain.  Genitourinary: Negative for flank pain.  Musculoskeletal: Negative for back pain and neck pain.  Skin: Negative for rash.  Neurological: Negative for headaches.  Hematological: Does not bruise/bleed easily.  Psychiatric/Behavioral: Negative for suicidal ideas.  Physical Exam Updated Vital Signs BP 137/76 (BP Location: Left Arm)   Pulse 85   Temp 98.2 F (36.8 C) (Oral)   Resp 16   Ht 1.778 m (5\' 10" )   Wt 105 kg   SpO2 98%   BMI 33.21 kg/m   Physical Exam Vitals and nursing note reviewed.  Constitutional:      Appearance: Normal appearance. He is well-developed.  HENT:     Head: Atraumatic.     Nose: Nose normal.     Mouth/Throat:     Mouth: Mucous membranes are moist.      Pharynx: Oropharynx is clear.  Eyes:     General: No scleral icterus.    Conjunctiva/sclera: Conjunctivae normal.     Pupils: Pupils are equal, round, and reactive to light.  Neck:     Trachea: No tracheal deviation.  Cardiovascular:     Rate and Rhythm: Normal rate and regular rhythm.     Pulses: Normal pulses.     Heart sounds: Normal heart sounds. No murmur. No friction rub. No gallop.   Pulmonary:     Effort: Pulmonary effort is normal. No accessory muscle usage or respiratory distress.     Breath sounds: Normal breath sounds.  Abdominal:     General: Bowel sounds are normal. There is no distension.     Palpations: Abdomen is soft.     Tenderness: There is no abdominal tenderness. There is no guarding.  Genitourinary:    Comments: No cva tenderness. Musculoskeletal:        General: No swelling.     Cervical back: Normal range of motion and neck supple. No rigidity.  Skin:    General: Skin is warm and dry.     Findings: No rash.  Neurological:     Mental Status: He is alert.     Comments: Alert, speech clear. Steady gait.   Psychiatric:        Mood and Affect: Mood normal.     Comments: Pt does not appear to be responding to internal stimuli. No active delusions or hallucinations. No SI/HI.      ED Results / Procedures / Treatments   Labs (all labs ordered are listed, but only abnormal results are displayed) Results for orders placed or performed during the hospital encounter of 03/18/20  Comprehensive metabolic panel  Result Value Ref Range   Sodium 137 135 - 145 mmol/L   Potassium 3.8 3.5 - 5.1 mmol/L   Chloride 101 98 - 111 mmol/L   CO2 27 22 - 32 mmol/L   Glucose, Bld 99 70 - 99 mg/dL   BUN 12 6 - 20 mg/dL   Creatinine, Ser 03/20/20 0.61 - 1.24 mg/dL   Calcium 9.4 8.9 - 9.41 mg/dL   Total Protein 7.5 6.5 - 8.1 g/dL   Albumin 4.1 3.5 - 5.0 g/dL   AST 28 15 - 41 U/L   ALT 48 (H) 0 - 44 U/L   Alkaline Phosphatase 51 38 - 126 U/L   Total Bilirubin 0.7 0.3 - 1.2  mg/dL   GFR calc non Af Amer >60 >60 mL/min   GFR calc Af Amer >60 >60 mL/min   Anion gap 9 5 - 15  Ethanol  Result Value Ref Range   Alcohol, Ethyl (B) <10 <10 mg/dL  cbc  Result Value Ref Range   WBC 4.5 4.0 - 10.5 K/uL   RBC 4.97 4.22 - 5.81 MIL/uL   Hemoglobin 13.8 13.0 - 17.0 g/dL  HCT 42.8 39.0 - 52.0 %   MCV 86.1 80.0 - 100.0 fL   MCH 27.8 26.0 - 34.0 pg   MCHC 32.2 30.0 - 36.0 g/dL   RDW 06.3 01.6 - 01.0 %   Platelets 344 150 - 400 K/uL   nRBC 0.0 0.0 - 0.2 %  Rapid urine drug screen (hospital performed)  Result Value Ref Range   Opiates NONE DETECTED NONE DETECTED   Cocaine NONE DETECTED NONE DETECTED   Benzodiazepines NONE DETECTED NONE DETECTED   Amphetamines NONE DETECTED NONE DETECTED   Tetrahydrocannabinol NONE DETECTED NONE DETECTED   Barbiturates NONE DETECTED NONE DETECTED    EKG None  Radiology No results found.  Procedures Procedures (including critical care time)  Medications Ordered in ED Medications  risperiDONE (RISPERDAL) tablet 1 mg (has no administration in time range)    ED Course  I have reviewed the triage vital signs and the nursing notes.  Pertinent labs & imaging results that were available during my care of the patient were reviewed by me and considered in my medical decision making (see chart for details).    MDM Rules/Calculators/A&P                      Labs sent.   Reviewed nursing notes and prior charts for additional history. Multiple prior ED evaluations for similar symptoms.   Labs reviewed/interpreted by me - chem normal. UDS neg.  Pt does request AM dose risperdal. risperdal 1 mg po.   No current hallucinations. Thought processes appear clear. Pt oriented, and cooperative. Pt indicates plans to go to [place] later today.  Patient currently appears stable for d/c.    Final Clinical Impression(s) / ED Diagnoses Final diagnoses:  None    Rx / DC Orders ED Discharge Orders    None       Cathren Laine,  MD 03/19/20 [phone]

## 2020-03-21 ENCOUNTER — Emergency Department (HOSPITAL_COMMUNITY)
Admission: EM | Admit: 2020-03-21 | Discharge: 2020-03-21 | Disposition: A | Discharge status: Home / Self Care | Payer: Self-pay | Attending: Emergency Medicine | Admitting: Emergency Medicine

## 2020-03-21 ENCOUNTER — Other Ambulatory Visit: Payer: Self-pay

## 2020-03-21 ENCOUNTER — Encounter (HOSPITAL_COMMUNITY): Payer: Self-pay | Admitting: Emergency Medicine

## 2020-03-21 DIAGNOSIS — F209 Schizophrenia, unspecified: Secondary | ICD-10-CM | POA: Insufficient documentation

## 2020-03-21 DIAGNOSIS — Z590 Homelessness: Secondary | ICD-10-CM | POA: Insufficient documentation

## 2020-03-21 DIAGNOSIS — Z760 Encounter for issue of repeat prescription: Secondary | ICD-10-CM | POA: Insufficient documentation

## 2020-03-21 MED ORDER — RISPERIDONE 2 MG PO TABS
3.0000 mg | ORAL_TABLET | Freq: Once | ORAL | Status: AC
  Administered 2020-03-21: 3 mg via ORAL
  Filled 2020-03-21: qty 1

## 2020-03-21 NOTE — ED Triage Notes (Signed)
Patient is not si or hi. Patient just want to get a dose of his Risperdal.

## 2020-03-21 NOTE — ED Provider Notes (Signed)
[facility] DEPT Provider Note   CSN: [id number] Arrival date & time: 03/21/20  0303     History Chief Complaint  Patient presents with  . Medication Refill    Cole Barrett is a 34 y.o. male.  Patient with past medical history notable for homelessness, mental disorder, paranoid behavior and hallucinations presents to the emergency department requesting a dose of Risperdal.  He takes this for hallucinations.  He states that he has been out of his medications.  He plans to follow-up with [facility].  He states that he just needs something to "quiet down the voices tonight."  He denies any suicidal or homicidal ideation.  Denies any other complaints at this time.  The history is provided by the patient. No language interpreter was used.       Past Medical History:  Diagnosis Date  . Homelessness   . Hypercholesterolemia   . Mental disorder   . Paranoid behavior ([place])   . PE (pulmonary embolism)   . Schizophrenia [facility])     Patient Active Problem List   Diagnosis Date Noted  . Acute psychosis ([place]) 08/31/2019  . Schizophrenia ([place]) 08/31/2019  . Schizoaffective disorder, bipolar type ([place]) 01/19/2019  . Alcohol abuse   . Auditory hallucinations   . Alcohol abuse with alcohol-induced mood disorder ([place]) 09/08/2016  . Homelessness 09/03/2016  . Person feigning illness 09/03/2016  . Brief psychotic disorder ([place]) 08/29/2016  . Cannabis use disorder, mild, abuse 08/29/2016  . Pulmonary emboli ([place]) 07/13/2016  . Adjustment disorder with emotional disturbance 03/12/2014  . Pulmonary embolism and infarction ([place]) 01/02/2014  . Acute left flank pain 01/02/2014  . Pulmonary embolism, bilateral ([place]) 01/02/2014    History reviewed. No pertinent surgical history.     Family History  Problem Relation Age of Onset  . Hypertension Mother     Social History   Tobacco Use  . Smoking status: Current Every Day Smoker    Packs/day: 0.50     Types: Cigarettes  . Smokeless tobacco: Never Used  Substance Use Topics  . Alcohol use: Yes    Comment: BAC not available  . Drug use: Yes    Types: Marijuana    Comment: Last used: 2 months ago UDS NA    Home Medications Prior to Admission medications   Medication Sig Start Date End Date Taking? Authorizing Provider  OLANZapine (ZYPREXA) 2.5 MG tablet Take 1 tablet (2.5 mg total) by mouth at bedtime. 12/09/19   Mesner, Corene Cornea, MD  risperiDONE (RISPERDAL) 1 MG tablet Take 1 tablet by mouth each morning.  Take 3 tablets by mouth at bedtime. 02/16/20   Fredia Sorrow, MD  amantadine (SYMMETREL) 100 MG capsule Take 1 capsule (100 mg total) by mouth 2 (two) times daily. Patient not taking: Reported on 11/04/2019 09/01/19 11/17/19  Mallie Darting, NP    Allergies    Haldol [haloperidol]  Review of Systems   Review of Systems  All other systems reviewed and are negative.   Physical Exam Updated Vital Signs BP 139/81   Pulse 83   Temp 98.6 F (37 C) (Oral)   Resp 20   Ht 5\' 10"  (1.778 m)   Wt 99.8 kg   SpO2 100%   BMI 31.57 kg/m   Physical Exam Vitals and nursing note reviewed.  Constitutional:      General: He is not in acute distress.    Appearance: He is well-developed. He is not ill-appearing.  HENT:     Head:  Normocephalic and atraumatic.  Eyes:     Conjunctiva/sclera: Conjunctivae normal.  Cardiovascular:     Rate and Rhythm: Normal rate.  Pulmonary:     Effort: Pulmonary effort is normal. No respiratory distress.  Abdominal:     General: There is no distension.  Musculoskeletal:     Cervical back: Neck supple.     Comments: Moves all extremities  Skin:    General: Skin is warm and dry.  Neurological:     Mental Status: He is alert and oriented to person, place, and time.  Psychiatric:        Mood and Affect: Mood normal.        Behavior: Behavior normal.     ED Results / Procedures / Treatments   Labs (all labs ordered are listed, but only  abnormal results are displayed) Labs Reviewed - No data to display  EKG None  Radiology No results found.  Procedures Procedures (including critical care time)  Medications Ordered in ED Medications  risperiDONE (RISPERDAL) tablet 3 mg (3 mg Oral Given 03/21/20 0443)    ED Course  I have reviewed the triage vital signs and the nursing notes.  Pertinent labs & imaging results that were available during my care of the patient were reviewed by me and considered in my medical decision making (see chart for details).    MDM Rules/Calculators/A&P                      Patient here requesting a dose of his Risperdal.  He states that he has been having some auditory hallucinations.  This is not new for him.  He does not have any suicidal or homicidal ideation.  We will give him his regular nighttime dose of Risperdal and plan for discharge.  Patient understands and agrees the plan.  He is stable and ready for discharge. Final Clinical Impression(s) / ED Diagnoses Final diagnoses:  Medication refill    Rx / DC Orders ED Discharge Orders    None       Roxy Horseman, PA-C 03/21/20 0757    [name], MD 03/21/20 2244

## 2020-04-08 ENCOUNTER — Emergency Department (HOSPITAL_COMMUNITY)
Admission: EM | Admit: 2020-04-08 | Discharge: 2020-04-09 | Disposition: A | Discharge status: Home / Self Care | Payer: Self-pay | Attending: Emergency Medicine | Admitting: Emergency Medicine

## 2020-04-08 ENCOUNTER — Encounter (HOSPITAL_COMMUNITY): Payer: Self-pay

## 2020-04-08 ENCOUNTER — Other Ambulatory Visit: Payer: Self-pay

## 2020-04-08 DIAGNOSIS — R443 Hallucinations, unspecified: Secondary | ICD-10-CM

## 2020-04-08 DIAGNOSIS — Z590 Homelessness: Secondary | ICD-10-CM | POA: Insufficient documentation

## 2020-04-08 DIAGNOSIS — Z760 Encounter for issue of repeat prescription: Secondary | ICD-10-CM | POA: Insufficient documentation

## 2020-04-08 DIAGNOSIS — R440 Auditory hallucinations: Secondary | ICD-10-CM | POA: Insufficient documentation

## 2020-04-08 DIAGNOSIS — Z86711 Personal history of pulmonary embolism: Secondary | ICD-10-CM | POA: Insufficient documentation

## 2020-04-08 DIAGNOSIS — F250 Schizoaffective disorder, bipolar type: Secondary | ICD-10-CM | POA: Insufficient documentation

## 2020-04-08 NOTE — ED Triage Notes (Signed)
Pt here for chronic AH that are saying they are going to hurt him, denies SI/HI, reports med compliance.

## 2020-04-09 MED ORDER — RISPERIDONE 3 MG PO TABS
3.0000 mg | ORAL_TABLET | Freq: Once | ORAL | Status: AC
  Administered 2020-04-09: 3 mg via ORAL
  Filled 2020-04-09: qty 1

## 2020-04-09 NOTE — ED Provider Notes (Signed)
[facility] EMERGENCY DEPARTMENT Provider Note   CSN: [id number] Arrival date & time: 04/08/20  2226     History Chief Complaint  Patient presents with  . Hallucinations    Cole Barrett is a 34 y.o. male.  Patient with past medical history notable for homelessness, mental disorder, hallucinations/paranoid behavior, prior PE, presents to the emergency department with a chief complaint of hallucinations.  He states that he has been hearing voices again.  He denies any homicidal or suicidal deviations.  He states that he normally takes Risperdal 3 mg before bed, but he is out of his medication.  He is requesting a dose of this here.  He states that he needs to get in and follow-up with [facility], but he has been working too much.  He denies any other complaints.  The history is provided by the patient. No language interpreter was used.       Past Medical History:  Diagnosis Date  . Homelessness   . Hypercholesterolemia   . Mental disorder   . Paranoid behavior ([place])   . PE (pulmonary embolism)   . Schizophrenia [facility])     Patient Active Problem List   Diagnosis Date Noted  . Acute psychosis ([place]) 08/31/2019  . Schizophrenia ([place]) 08/31/2019  . Schizoaffective disorder, bipolar type ([place]) 01/19/2019  . Alcohol abuse   . Auditory hallucinations   . Alcohol abuse with alcohol-induced mood disorder ([place]) 09/08/2016  . Homelessness 09/03/2016  . Person feigning illness 09/03/2016  . Brief psychotic disorder ([place]) 08/29/2016  . Cannabis use disorder, mild, abuse 08/29/2016  . Pulmonary emboli ([place]) 07/13/2016  . Adjustment disorder with emotional disturbance 03/12/2014  . Pulmonary embolism and infarction ([place]) 01/02/2014  . Acute left flank pain 01/02/2014  . Pulmonary embolism, bilateral ([place]) 01/02/2014    History reviewed. No pertinent surgical history.     Family History  Problem Relation Age of Onset  . Hypertension Mother      Social History   Tobacco Use  . Smoking status: Current Every Day Smoker    Packs/day: 0.50    Types: Cigarettes  . Smokeless tobacco: Never Used  Substance Use Topics  . Alcohol use: Yes    Comment: BAC not available  . Drug use: Yes    Types: Marijuana    Comment: Last used: 2 months ago UDS NA    Home Medications Prior to Admission medications   Medication Sig Start Date End Date Taking? Authorizing Provider  OLANZapine (ZYPREXA) 2.5 MG tablet Take 1 tablet (2.5 mg total) by mouth at bedtime. 12/09/19   Mesner, Corene Cornea, MD  risperiDONE (RISPERDAL) 1 MG tablet Take 1 tablet by mouth each morning.  Take 3 tablets by mouth at bedtime. 02/16/20   Fredia Sorrow, MD  amantadine (SYMMETREL) 100 MG capsule Take 1 capsule (100 mg total) by mouth 2 (two) times daily. Patient not taking: Reported on 11/04/2019 09/01/19 11/17/19  Mallie Darting, NP    Allergies    Haldol [haloperidol]  Review of Systems   Review of Systems  Constitutional: Negative for activity change and fever.  Psychiatric/Behavioral: Positive for hallucinations and sleep disturbance.    Physical Exam Updated Vital Signs BP 136/79 (BP Location: Right Arm)   Pulse 92   Temp 98.3 F (36.8 C) (Oral)   Resp (!) 21   SpO2 95%   Physical Exam Vitals and nursing note reviewed.  Constitutional:      General: He is not in acute distress.  Appearance: He is well-developed. He is not ill-appearing.  HENT:     Head: Normocephalic and atraumatic.  Eyes:     Conjunctiva/sclera: Conjunctivae normal.  Cardiovascular:     Rate and Rhythm: Normal rate.  Pulmonary:     Effort: Pulmonary effort is normal. No respiratory distress.  Abdominal:     General: There is no distension.  Musculoskeletal:     Cervical back: Neck supple.     Comments: Moves all extremities  Skin:    General: Skin is warm and dry.  Neurological:     Mental Status: He is alert and oriented to person, place, and time.  Psychiatric:         Mood and Affect: Mood normal.        Behavior: Behavior normal.     ED Results / Procedures / Treatments   Labs (all labs ordered are listed, but only abnormal results are displayed) Labs Reviewed - No data to display  EKG None  Radiology No results found.  Procedures Procedures (including critical care time)  Medications Ordered in ED Medications  risperiDONE (RISPERDAL) tablet 3 mg (has no administration in time range)    ED Course  I have reviewed the triage vital signs and the nursing notes.  Pertinent labs & imaging results that were available during my care of the patient were reviewed by me and considered in my medical decision making (see chart for details).    MDM Rules/Calculators/A&P                      Patient here requesting medication refill, or at least 1 dose of his nighttime Risperdal.  He denies suicidal or homicidal ideation.  I will give him a dose here tonight.  He is encouraged to follow-up with [facility].  He understands and agrees with the plan. Final Clinical Impression(s) / ED Diagnoses Final diagnoses:  Hallucinations    Rx / DC Orders ED Discharge Orders    None       Roxy Horseman, PA-C 04/09/20 [id number]    Ward, Layla Maw, DO 04/09/20 0430

## 2020-04-16 ENCOUNTER — Emergency Department (HOSPITAL_COMMUNITY)
Admission: EM | Admit: 2020-04-16 | Discharge: 2020-04-17 | Disposition: A | Discharge status: Home / Self Care | Payer: Self-pay | Attending: Emergency Medicine | Admitting: Emergency Medicine

## 2020-04-16 DIAGNOSIS — F250 Schizoaffective disorder, bipolar type: Secondary | ICD-10-CM | POA: Insufficient documentation

## 2020-04-16 DIAGNOSIS — F17210 Nicotine dependence, cigarettes, uncomplicated: Secondary | ICD-10-CM | POA: Insufficient documentation

## 2020-04-16 DIAGNOSIS — R440 Auditory hallucinations: Secondary | ICD-10-CM | POA: Insufficient documentation

## 2020-04-16 DIAGNOSIS — Z86711 Personal history of pulmonary embolism: Secondary | ICD-10-CM | POA: Insufficient documentation

## 2020-04-16 DIAGNOSIS — R443 Hallucinations, unspecified: Secondary | ICD-10-CM

## 2020-04-16 NOTE — ED Triage Notes (Signed)
Pt arrives ambulatory to triage, says he has not had his Risperidol dose tonight and would like to have that, says he is hearing voices. No SI or HI.

## 2020-04-17 ENCOUNTER — Other Ambulatory Visit: Payer: Self-pay

## 2020-04-17 ENCOUNTER — Encounter (HOSPITAL_COMMUNITY): Payer: Self-pay | Admitting: *Deleted

## 2020-04-17 MED ORDER — RISPERIDONE 3 MG PO TABS
3.0000 mg | ORAL_TABLET | ORAL | Status: AC
  Administered 2020-04-17: 3 mg via ORAL
  Filled 2020-04-17: qty 1

## 2020-04-17 NOTE — ED Notes (Signed)
Discharge instructions discussed with pt. Pt verbalized understanding with no questions at this time. Pt ambulatory at discharge 

## 2020-04-17 NOTE — ED Provider Notes (Signed)
[name] EMERGENCY DEPARTMENT Provider Note   CSN: [id number] Arrival date & time: 04/16/20  2346     History Chief Complaint  Patient presents with  . Hallucinations    Cole Barrett is a 34 y.o. male.  The history is provided by the patient.  Mental Health Problem Presenting symptoms: hallucinations   Presenting symptoms: no agitation and no suicidal thoughts   Presenting symptoms comment:  Chronic  Patient accompanied by: alone. Degree of incapacity (severity):  Mild Onset quality:  Gradual Timing:  Constant Progression:  Unchanged Chronicity:  Chronic Context: not alcohol use   Treatment compliance:  Some of the time Relieved by:  Nothing Worsened by:  Nothing Ineffective treatments:  None tried Associated symptoms: no abdominal pain, no anhedonia, no anxiety, no appetite change, no chest pain, no decreased need for sleep, not distractible, no euphoric mood, no fatigue, no feelings of worthlessness, no headaches, no hypersomnia, no hyperventilation, no insomnia, no irritability, no poor judgment, no school problems and no weight change   Risk factors: hx of mental illness   Patient is not SI or HI, he would like a dose of his home risperdal.       Past Medical History:  Diagnosis Date  . Homelessness   . Hypercholesterolemia   . Mental disorder   . Paranoid behavior (H[facility])   . PE (pulmonary embolism)   . Schizophrenia [facility])     Patient Active Problem List   Diagnosis Date Noted  . Acute psychosis (HCC) 08/31/2019  . Schizophrenia (HCC) 08/31/2019  . Schizoaffective disorder, bipolar type (HCC) 01/19/2019  . Alcohol abuse   . Auditory hallucinations   . Alcohol abuse with alcohol-induced mood disorder (HCC) 09/08/2016  . Homelessness 09/03/2016  . Person feigning illness 09/03/2016  . 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 Cannabis use disorder, mild, abuse 08/29/2016  . Pulmonary emboli (HCC) 07/13/2016  .  Adjustment disorder with emotional disturbance 03/12/2014  . 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 Acute left flank pain 01/02/2014  . Pulmonary embolism, bilateral (HCC) 01/02/2014    History reviewed. No pertinent surgical history.     Family History  Problem Relation Age of Onset  . Hypertension Mother     Social History   Tobacco Use  . Smoking status: Current Every Day Smoker    Packs/day: 0.50    Types: Cigarettes  . Smokeless tobacco: Never Used  Substance Use Topics  . Alcohol use: Yes    Comment: BAC not available  . Drug use: Yes    Types: Marijuana    Comment: Last used: 2 months ago UDS NA    Home Medications Prior to Admission medications   Medication Sig Start Date End Date Taking? Authorizing Provider  OLANZapine (ZYPREXA) 2.5 MG tablet Take 1 tablet (2.5 mg total) by mouth at bedtime. 12/09/19   Mesner, Barbara Cower, MD  risperiDONE (RISPERDAL) 1 MG tablet Take 1 tablet by mouth each morning.  Take 3 tablets by mouth at bedtime. 02/16/20   Vanetta Mulders, MD  amantadine (SYMMETREL) 100 MG capsule Take 1 capsule (100 mg total) by mouth 2 (two) times daily. Patient not taking: Reported on 11/04/2019 09/01/19 11/17/19  [name], NP    Allergies    Haldol [haloperidol]  Review of Systems   Review of Systems  Constitutional: Negative for appetite change, fatigue and irritability.  HENT: Negative for congestion.   Eyes: Negative for visual disturbance.  Respiratory: Negative for shortness of breath.  Cardiovascular: Negative for chest pain.  Gastrointestinal: Negative for abdominal pain.  Genitourinary: Negative for difficulty urinating.  Musculoskeletal: Negative for arthralgias.  Skin: Negative for rash.  Neurological: Negative for headaches.  Psychiatric/Behavioral: Positive for hallucinations. Negative for agitation and suicidal ideas. The patient is not nervous/anxious and does not have insomnia.   All other systems reviewed and are  negative.   Physical Exam Updated Vital Signs BP 132/82 (BP Location: Left Arm)   Pulse 92   Temp 98.1 F (36.7 C) (Oral)   Resp 16   SpO2 99%   Physical Exam Vitals and nursing note reviewed.  Constitutional:      General: He is not in acute distress.    Appearance: Normal appearance.  HENT:     Head: Normocephalic and atraumatic.     Nose: Nose normal.  Eyes:     Pupils: Pupils are equal, round, and reactive to light.  Cardiovascular:     Rate and Rhythm: Normal rate and regular rhythm.     Pulses: Normal pulses.     Heart sounds: Normal heart sounds.  Pulmonary:     Effort: Pulmonary effort is normal.     Breath sounds: Normal breath sounds.  Abdominal:     General: Abdomen is flat. Bowel sounds are normal.     Tenderness: There is no abdominal tenderness. There is no guarding.  Musculoskeletal:        General: Normal range of motion.     Cervical back: Normal range of motion and neck supple.  Skin:    General: Skin is warm and dry.     Capillary Refill: Capillary refill takes less than 2 seconds.  Neurological:     General: No focal deficit present.     Mental Status: He is alert and oriented to person, place, and time.     Deep Tendon Reflexes: Reflexes normal.  Psychiatric:        Mood and Affect: Mood normal.        Behavior: Behavior normal.     ED Results / Procedures / Treatments   Labs (all labs ordered are listed, but only abnormal results are displayed) Labs Reviewed - No data to display  EKG None  Radiology No results found.  Procedures Procedures (including critical care time)  Medications Ordered in ED Medications  risperiDONE (RISPERDAL) tablet 3 mg (3 mg Oral Given 04/17/20 0331)    ED Course  I have reviewed the triage vital signs and the nursing notes.  Pertinent labs & imaging results that were available during my care of the patient were reviewed by me and considered in my medical decision making (see chart for details).      No acute condition.  No SI or HI.  Patient needed and was given a dose of his home medication.    Cole Barrett was evaluated in Emergency Department on 04/17/2020 for the symptoms described in the history of present illness. He was evaluated in the context of the global COVID-19 pandemic, which necessitated consideration that the patient might be at risk for infection with the SARS-CoV-2 virus that causes COVID-19. Institutional protocols and algorithms that pertain to the evaluation of patients at risk for COVID-19 are in a state of rapid change based on information released by regulatory bodies including the CDC and federal and state organizations. These policies and algorithms were followed during the patient's care in the ED.  Final Clinical Impression(s) / ED Diagnoses Return for intractable cough, coughing up blood,fevers >100.4  unrelieved by medication, shortness of breath, intractable vomiting, chest pain, shortness of breath, weakness,numbness, changes in speech, facial asymmetry,abdominal pain, passing out,Inability to tolerate liquids or food, cough, altered mental status or any concerns. No signs of systemic illness or infection. The patient is nontoxic-appearing on exam and vital signs are within normal limits.   I have reviewed the triage vital signs and the nursing notes. Pertinent labs &imaging results that were available during my care of the patient were reviewed by me and considered in my medical decision making (see chart for details).After history, exam, and medical workup I feel the patient has beenappropriately medically screened and is safe for discharge home. Pertinent diagnoses were discussed with the patient. Patient was given return precautions.   Turquoise Esch, MD 04/17/20 [phone]

## 2020-04-26 ENCOUNTER — Encounter (HOSPITAL_COMMUNITY): Payer: Self-pay | Admitting: Emergency Medicine

## 2020-04-26 ENCOUNTER — Other Ambulatory Visit: Payer: Self-pay

## 2020-04-26 ENCOUNTER — Emergency Department (HOSPITAL_COMMUNITY)
Admission: EM | Admit: 2020-04-26 | Discharge: 2020-04-26 | Disposition: A | Discharge status: Home / Self Care | Payer: Self-pay | Attending: Emergency Medicine | Admitting: Emergency Medicine

## 2020-04-26 DIAGNOSIS — F259 Schizoaffective disorder, unspecified: Secondary | ICD-10-CM | POA: Insufficient documentation

## 2020-04-26 DIAGNOSIS — R440 Auditory hallucinations: Secondary | ICD-10-CM | POA: Insufficient documentation

## 2020-04-26 DIAGNOSIS — F17210 Nicotine dependence, cigarettes, uncomplicated: Secondary | ICD-10-CM | POA: Insufficient documentation

## 2020-04-26 DIAGNOSIS — Z590 Homelessness: Secondary | ICD-10-CM | POA: Insufficient documentation

## 2020-04-26 DIAGNOSIS — Z79899 Other long term (current) drug therapy: Secondary | ICD-10-CM | POA: Insufficient documentation

## 2020-04-26 MED ORDER — RISPERIDONE 1 MG PO TABS
1.0000 mg | ORAL_TABLET | Freq: Once | ORAL | Status: AC
  Administered 2020-04-26: 1 mg via ORAL
  Filled 2020-04-26: qty 1

## 2020-04-26 NOTE — ED Provider Notes (Signed)
[facility]-EMERGENCY DEPT Provider Note   CSN: [id number] Arrival date & time: 04/26/20  0349     History Chief Complaint  Patient presents with  . Medication Refill    Cole Barrett is a 34 y.o. male.  Patient with a history of schizophrenia, HLD, PE, paranoid behavior presents tonight because he felt his chronic auditory hallucinations, described as voices, were worse tonight. He states he has been out of his Risperdal for several days, which are prescribed through [facility]. No SI/HI. He does not feel the hallucinations he is having are unusual for him. No physical complaints.   The history is provided by the patient. No language interpreter was used.  Medication Refill      Past Medical History:  Diagnosis Date  . Homelessness   . Hypercholesterolemia   . Mental disorder   . Paranoid behavior (H[facility])   . PE (pulmonary embolism)   . Schizophrenia [facility])     Patient Active Problem List   Diagnosis Date Noted  . Acute psychosis (HCC) 08/31/2019  . Schizophrenia (HCC) 08/31/2019  . Schizoaffective disorder, bipolar type (HCC) 01/19/2019  . Alcohol abuse   . Auditory hallucinations   . Alcohol abuse with alcohol-induced mood disorder (HCC) 09/08/2016  . Homelessness 09/03/2016  . Person feigning illness 09/03/2016  . 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 Cannabis use disorder, mild, abuse 08/29/2016  . Pulmonary emboli (HCC) 07/13/2016  . Adjustment disorder with emotional disturbance 03/12/2014  . 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 Acute left flank pain 01/02/2014  . Pulmonary embolism, bilateral (HCC) 01/02/2014    History reviewed. No pertinent surgical history.     Family History  Problem Relation Age of Onset  . Hypertension Mother     Social History   Tobacco Use  . Smoking status: Current Every Day Smoker    Packs/day: 0.50    Types: Cigarettes  . Smokeless tobacco: Never Used  Substance  Use Topics  . Alcohol use: Yes    Comment: BAC not available  . Drug use: Yes    Types: Marijuana    Comment: Last used: 2 months ago UDS NA    Home Medications Prior to Admission medications   Medication Sig Start Date End Date Taking? Authorizing Provider  OLANZapine (ZYPREXA) 2.5 MG tablet Take 1 tablet (2.5 mg total) by mouth at bedtime. 12/09/19   Mesner, Barbara Cower, MD  risperiDONE (RISPERDAL) 1 MG tablet Take 1 tablet by mouth each morning.  Take 3 tablets by mouth at bedtime. 02/16/20   Vanetta Mulders, MD  amantadine (SYMMETREL) 100 MG capsule Take 1 capsule (100 mg total) by mouth 2 (two) times daily. Patient not taking: Reported on 11/04/2019 09/01/19 11/17/19  [name], NP    Allergies    Haldol [haloperidol]  Review of Systems   Review of Systems  Constitutional: Negative for chills and fever.  HENT: Negative.   Respiratory: Negative.   Cardiovascular: Negative.   Gastrointestinal: Negative.   Musculoskeletal: Negative.   Skin: Negative.   Neurological: Negative.   Psychiatric/Behavioral: The patient is hyperactive.     Physical Exam Updated Vital Signs BP (!) 133/91 (BP Location: Right Arm)   Pulse 72   Temp 98.2 F (36.8 C) (Oral)   Resp 16   Ht 5\' 10"  (1.778 m)   Wt 99.8 kg   SpO2 100%   BMI 31.57 kg/m   Physical Exam Vitals and nursing note reviewed.  Constitutional:  Appearance: He is well-developed.  HENT:     Head: Normocephalic.  Cardiovascular:     Rate and Rhythm: Normal rate.  Pulmonary:     Effort: Pulmonary effort is normal.  Abdominal:     Tenderness: There is no guarding.  Musculoskeletal:        General: Normal range of motion.     Cervical back: Normal range of motion.  Skin:    General: Skin is warm and dry.  Neurological:     Mental Status: He is alert and oriented to person, place, and time.  Psychiatric:        Attention and Perception: He perceives auditory hallucinations.        Mood and Affect: Mood normal.          Speech: Speech normal.        Behavior: Behavior normal. Behavior is cooperative.        Thought Content: Thought content does not include homicidal or suicidal ideation.     ED Results / Procedures / Treatments   Labs (all labs ordered are listed, but only abnormal results are displayed) Labs Reviewed - No data to display  EKG None  Radiology No results found.  Procedures Procedures (including critical care time)  Medications Ordered in ED Medications  risperiDONE (RISPERDAL) tablet 1 mg (has no administration in time range)    ED Course  I have reviewed the triage vital signs and the nursing notes.  Pertinent labs & imaging results that were available during my care of the patient were reviewed by me and considered in my medical decision making (see chart for details).    MDM Rules/Calculators/A&P                      Patient well known to the ED with 31 visits in the past 6 months presents requesting Risperdal for his chronic auditory hallucinations that are worse since he has been out of his medication for several days. No SI/HI.   He is cooperative, calm, reasonable/rational. He does not feel he needs psychiatric admission. He intends to follow up with [facility] after the holiday weekend to have his regular medication prescription filled.   He is given a dose of Risperdal and encouraged to see his doctor as planned.  Final Clinical Impression(s) / ED Diagnoses Final diagnoses:  None   1. Chronic auditory hallucinations  Rx / DC Orders ED Discharge Orders    None       Charlann Lange, PA-C 04/26/20 [place], [place], MD 04/26/20 [phone]

## 2020-04-26 NOTE — Discharge Instructions (Addendum)
Follow up with [facility] as planned to have your regular prescriptions filled.   Return to the emergency department as needed.

## 2020-04-26 NOTE — ED Triage Notes (Signed)
Patient requesting medication Risperdal prescription and a dose given to him tonight.

## 2020-05-11 ENCOUNTER — Encounter (HOSPITAL_COMMUNITY): Payer: Self-pay

## 2020-05-11 ENCOUNTER — Emergency Department (HOSPITAL_COMMUNITY)
Admission: EM | Admit: 2020-05-11 | Discharge: 2020-05-11 | Disposition: A | Discharge status: Home / Self Care | Payer: Self-pay | Attending: Emergency Medicine | Admitting: Emergency Medicine

## 2020-05-11 ENCOUNTER — Other Ambulatory Visit: Payer: Self-pay

## 2020-05-11 DIAGNOSIS — Z5321 Procedure and treatment not carried out due to patient leaving prior to being seen by health care provider: Secondary | ICD-10-CM | POA: Insufficient documentation

## 2020-05-11 DIAGNOSIS — R443 Hallucinations, unspecified: Secondary | ICD-10-CM | POA: Insufficient documentation

## 2020-05-11 NOTE — ED Triage Notes (Signed)
Patient arrived stating he is having auditory hallucinations and needs his dose of Risperdal. Denies SI or HI.

## 2020-05-20 ENCOUNTER — Other Ambulatory Visit: Payer: Self-pay

## 2020-05-20 ENCOUNTER — Emergency Department (HOSPITAL_COMMUNITY)
Admission: EM | Admit: 2020-05-20 | Discharge: 2020-05-20 | Disposition: A | Discharge status: Home / Self Care | Payer: Self-pay | Attending: Emergency Medicine | Admitting: Emergency Medicine

## 2020-05-20 ENCOUNTER — Encounter (HOSPITAL_COMMUNITY): Payer: Self-pay

## 2020-05-20 DIAGNOSIS — R440 Auditory hallucinations: Secondary | ICD-10-CM | POA: Insufficient documentation

## 2020-05-20 DIAGNOSIS — Z5321 Procedure and treatment not carried out due to patient leaving prior to being seen by health care provider: Secondary | ICD-10-CM | POA: Insufficient documentation

## 2020-05-20 LAB — COMPREHENSIVE METABOLIC PANEL
ALT: 38 U/L (ref 0–44)
AST: 26 U/L (ref 15–41)
Albumin: 3.7 g/dL (ref 3.5–5.0)
Alkaline Phosphatase: 41 U/L (ref 38–126)
Anion gap: 11 (ref 5–15)
BUN: 11 mg/dL (ref 6–20)
CO2: 21 mmol/L — ABNORMAL LOW (ref 22–32)
Calcium: 8.9 mg/dL (ref 8.9–10.3)
Chloride: 107 mmol/L (ref 98–111)
Creatinine, Ser: 1.09 mg/dL (ref 0.61–1.24)
GFR calc Af Amer: >60 mL/min (ref >60)
GFR calc non Af Amer: >60 mL/min (ref >60)
Glucose, Bld: 110 mg/dL — ABNORMAL HIGH (ref 70–99)
Potassium: 4 mmol/L (ref 3.5–5.1)
Sodium: 139 mmol/L (ref 135–145)
Total Bilirubin: 0.8 mg/dL (ref 0.3–1.2)
Total Protein: 6.4 g/dL — ABNORMAL LOW (ref 6.5–8.1)

## 2020-05-20 LAB — SALICYLATE LEVEL: Salicylate Lvl: <7 mg/dL — ABNORMAL LOW (ref 7.0–30.0)

## 2020-05-20 LAB — CBC
HCT: 39.2 % (ref 39.0–52.0)
Hemoglobin: 12.4 g/dL — ABNORMAL LOW (ref 13.0–17.0)
MCH: 27.8 pg (ref 26.0–34.0)
MCHC: 31.6 g/dL (ref 30.0–36.0)
MCV: 87.9 fL (ref 80.0–100.0)
Platelets: 281 K/uL (ref 150–400)
RBC: 4.46 MIL/uL (ref 4.22–5.81)
RDW: 12.8 % (ref 11.5–15.5)
WBC: 4.9 K/uL (ref 4.0–10.5)
nRBC: 0 % (ref 0.0–0.2)

## 2020-05-20 LAB — ACETAMINOPHEN LEVEL: Acetaminophen (Tylenol), Serum: <10 ug/mL — ABNORMAL LOW (ref 10–30)

## 2020-05-20 LAB — ETHANOL: Alcohol, Ethyl (B): <10 mg/dL (ref <10)

## 2020-05-20 NOTE — ED Notes (Signed)
Called for vitals x3, no answer. 

## 2020-05-20 NOTE — ED Triage Notes (Signed)
Pt reports chronic auditory hallucinations, states he has been out of his meds for several days. Denies SI/HI

## 2020-06-05 ENCOUNTER — Encounter (HOSPITAL_COMMUNITY): Payer: Self-pay | Admitting: Emergency Medicine

## 2020-06-05 ENCOUNTER — Other Ambulatory Visit: Payer: Self-pay

## 2020-06-05 ENCOUNTER — Emergency Department (HOSPITAL_COMMUNITY)
Admission: EM | Admit: 2020-06-05 | Discharge: 2020-06-06 | Disposition: A | Discharge status: Home / Self Care | Payer: Self-pay | Attending: Emergency Medicine | Admitting: Emergency Medicine

## 2020-06-05 DIAGNOSIS — F332 Major depressive disorder, recurrent severe without psychotic features: Secondary | ICD-10-CM | POA: Insufficient documentation

## 2020-06-05 DIAGNOSIS — R440 Auditory hallucinations: Secondary | ICD-10-CM | POA: Insufficient documentation

## 2020-06-05 DIAGNOSIS — F2089 Other schizophrenia: Secondary | ICD-10-CM | POA: Insufficient documentation

## 2020-06-05 DIAGNOSIS — F17210 Nicotine dependence, cigarettes, uncomplicated: Secondary | ICD-10-CM | POA: Insufficient documentation

## 2020-06-05 DIAGNOSIS — Z79899 Other long term (current) drug therapy: Secondary | ICD-10-CM | POA: Insufficient documentation

## 2020-06-05 LAB — CBC
HCT: 39.8 % (ref 39.0–52.0)
Hemoglobin: 12.5 g/dL — ABNORMAL LOW (ref 13.0–17.0)
MCH: 27.4 pg (ref 26.0–34.0)
MCHC: 31.4 g/dL (ref 30.0–36.0)
MCV: 87.1 fL (ref 80.0–100.0)
Platelets: 300 10*3/uL (ref 150–400)
RBC: 4.57 MIL/uL (ref 4.22–5.81)
RDW: 12.8 % (ref 11.5–15.5)
WBC: 5.2 10*3/uL (ref 4.0–10.5)
nRBC: 0 % (ref 0.0–0.2)

## 2020-06-05 LAB — COMPREHENSIVE METABOLIC PANEL
ALT: 39 U/L (ref 0–44)
AST: 22 U/L (ref 15–41)
Albumin: 3.9 g/dL (ref 3.5–5.0)
Alkaline Phosphatase: 37 U/L — ABNORMAL LOW (ref 38–126)
Anion gap: 12 (ref 5–15)
BUN: 15 mg/dL (ref 6–20)
CO2: 23 mmol/L (ref 22–32)
Calcium: 8.9 mg/dL (ref 8.9–10.3)
Chloride: 104 mmol/L (ref 98–111)
Creatinine, Ser: 1.12 mg/dL (ref 0.61–1.24)
GFR calc Af Amer: >60 mL/min (ref >60)
GFR calc non Af Amer: >60 mL/min (ref >60)
Glucose, Bld: 129 mg/dL — ABNORMAL HIGH (ref 70–99)
Potassium: 3.7 mmol/L (ref 3.5–5.1)
Sodium: 139 mmol/L (ref 135–145)
Total Bilirubin: 0.6 mg/dL (ref 0.3–1.2)
Total Protein: 6.8 g/dL (ref 6.5–8.1)

## 2020-06-05 LAB — RAPID URINE DRUG SCREEN, HOSP PERFORMED
Amphetamines: NOT DETECTED
Barbiturates: NOT DETECTED
Benzodiazepines: NOT DETECTED
Cocaine: NOT DETECTED
Opiates: NOT DETECTED
Tetrahydrocannabinol: NOT DETECTED

## 2020-06-05 LAB — ETHANOL: Alcohol, Ethyl (B): <10 mg/dL (ref <10)

## 2020-06-05 MED ORDER — RISPERIDONE 3 MG PO TABS
3.0000 mg | ORAL_TABLET | Freq: Every day | ORAL | Status: DC
  Administered 2020-06-06: 3 mg via ORAL
  Filled 2020-06-05: qty 1

## 2020-06-05 NOTE — ED Provider Notes (Signed)
[facility] EMERGENCY DEPARTMENT Provider Note   CSN: [id number] Arrival date & time: 06/05/20  2028     History Chief Complaint  Patient presents with  . Hallucinations    Cole Barrett is a 34 y.o. male.  The history is provided by the patient.  Mental Health Problem Presenting symptoms: hallucinations   Presenting symptoms: no suicidal thoughts   Degree of incapacity (severity):  Severe Onset quality:  Gradual Timing:  Constant Progression:  Worsening Chronicity:  Recurrent Relieved by:  Nothing Worsened by:  Nothing Associated symptoms: no abdominal pain, no chest pain and no headaches    Patient with history of schizophrenia presents with hallucinations.  Patient reports he is has worsening of his chronic hallucinations.  He reports the voices are telling him they are going to kill him.  He denies command hallucinations.  He denies SI. He has no other acute complaints    Past Medical History:  Diagnosis Date  . Homelessness   . Hypercholesterolemia   . Mental disorder   . Paranoid behavior (HCC)   . PE (pulmonary embolism)   . Schizophrenia [facility])     Patient Active Problem List   Diagnosis Date Noted  . Acute psychosis (HCC) 08/31/2019  . Schizophrenia (HCC) 08/31/2019  . Schizoaffective disorder, bipolar type (HCC) 01/19/2019  . Alcohol abuse   . Auditory hallucinations   . Alcohol abuse with alcohol-induced mood disorder (HCC) 09/08/2016  . Homelessness 09/03/2016  . Person feigning illness 09/03/2016  . 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 Cannabis use disorder, mild, abuse 08/29/2016  . Pulmonary emboli (HCC) 07/13/2016  . Adjustment disorder with emotional disturbance 03/12/2014  . 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 Acute left flank pain 01/02/2014  . Pulmonary embolism, bilateral (HCC) 01/02/2014    History reviewed. No pertinent surgical history.     Family History  Problem Relation  Age of Onset  . Hypertension Mother     Social History   Tobacco Use  . Smoking status: Current Every Day Smoker    Packs/day: 0.50    Types: Cigarettes  . Smokeless tobacco: Never Used  Vaping Use  . Vaping Use: Never used  Substance Use Topics  . Alcohol use: Yes    Comment: BAC not available  . Drug use: Yes    Types: Marijuana    Comment: Last used: 2 months ago UDS NA    Home Medications Prior to Admission medications   Medication Sig Start Date End Date Taking? Authorizing Provider  OLANZapine (ZYPREXA) 2.5 MG tablet Take 1 tablet (2.5 mg total) by mouth at bedtime. Patient not taking: Reported on 06/05/2020 12/09/19   Mesner, Barbara Cower, MD  risperiDONE (RISPERDAL) 1 MG tablet Take 1 tablet by mouth each morning.  Take 3 tablets by mouth at bedtime. Patient not taking: Reported on 06/05/2020 02/16/20   Vanetta Mulders, MD  amantadine (SYMMETREL) 100 MG capsule Take 1 capsule (100 mg total) by mouth 2 (two) times daily. Patient not taking: Reported on 11/04/2019 09/01/19 11/17/19  [name], NP    Allergies    Haldol [haloperidol]  Review of Systems   Review of Systems  Constitutional: Negative for fever.  Cardiovascular: Negative for chest pain.  Gastrointestinal: Negative for abdominal pain and vomiting.  Neurological: Negative for headaches.  Psychiatric/Behavioral: Positive for hallucinations. Negative for suicidal ideas.  All other systems reviewed and are negative.   Physical Exam Updated Vital Signs BP 130/77 (BP Location: Left Arm)  Pulse (!) 101   Temp 98.5 F (36.9 C) (Oral)   Resp 16   SpO2 97%   Physical Exam CONSTITUTIONAL: Well developed/well nourished HEAD: Normocephalic/atraumatic EYES: EOMI ENMT: Mucous membranes moist NECK: supple no meningeal signs SPINE/BACK:entire spine nontender CV: S1/S2 noted, no murmurs/rubs/gallops noted LUNGS: Lungs are clear to auscultation bilaterally, no apparent distress ABDOMEN: soft, nontender NEURO: Pt  is awake/alert/appropriate, moves all extremitiesx4.  No facial droop.  EXTREMITIES: pulses normal/equal, full ROM SKIN: warm, color normal PSYCH: no abnormalities of mood noted, alert and oriented to situation  ED Results / Procedures / Treatments   Labs (all labs ordered are listed, but only abnormal results are displayed) Labs Reviewed  COMPREHENSIVE METABOLIC PANEL - Abnormal; Notable for the following components:      Result Value   Glucose, Bld 129 (*)    Alkaline Phosphatase 37 (*)    All other components within normal limits  CBC - Abnormal; Notable for the following components:   Hemoglobin 12.5 (*)    All other components within normal limits  ETHANOL  RAPID URINE DRUG SCREEN, HOSP PERFORMED    EKG None  Radiology No results found.  Procedures Procedures   Medications Ordered in ED Medications  risperiDONE (RISPERDAL) tablet 3 mg (has no administration in time range)    ED Course  I have reviewed the triage vital signs and the nursing notes.  Pertinent labs results that were available during my care of the patient were reviewed by me and considered in my medical decision making (see chart for details).    MDM Rules/Calculators/A&P                          Patient medically stable at this time.  He is pending psych evaluation.  The patient has been placed in psychiatric observation due to the need to provide a safe environment for the patient while obtaining psychiatric consultation and evaluation, as well as ongoing medical and medication management to treat the patient's condition.  The patient has not been placed under full IVC at this time.  Final Clinical Impression(s) / ED Diagnoses Final diagnoses:  Auditory hallucination    Rx / DC Orders ED Discharge Orders    None       Zadie Rhine, MD 06/05/20 2351

## 2020-06-05 NOTE — ED Triage Notes (Signed)
Patient reports chronic auditory hallucinations for several years , denies SI or HI .

## 2020-06-06 NOTE — ED Notes (Signed)
Texted MD for dc instructions. Pt sleeping. Will continue to monitor pt. No distress noted.

## 2020-06-06 NOTE — BH Assessment (Signed)
Clinician contacted pt's nurse in an attempt to complete pt's BH Assessment. Pt is not currently in a room at this time; pt's nurse requested clinician call back in 25 minutes to complete the assessment. TTS will call pt back in approximately 25 minutes in an attempt to complete pt's BH Assessment.

## 2020-06-06 NOTE — ED Notes (Addendum)
ALL belongings - 3 labeled belongings bag - returned to pt - Pt verified all items present.

## 2020-06-06 NOTE — ED Notes (Signed)
Merwyn Katos, APP, aware pt is ready for d/c.

## 2020-06-06 NOTE — ED Notes (Signed)
Pt woke - advised he is feeling better and wants to leave - advised pt waiting on paperwork - pt voiced understanding and appreciation - ambulated to bathroom then back to room where pt is now sleeping - snorous respirations noted.

## 2020-06-06 NOTE — ED Notes (Signed)
Per shift report, pt was psych cleared at [phone] - Provider not notified by [facility] SW - Candy, RN, advised Dr Bebe Shaggy at approx [phone] - This RN notified Dr Roselyn Bering this am - requested for RN to contact another provider - Message sent to Merwyn Katos, APP. Continuing to wait for d/c paperwork. Suzy Bouchard, Consulting civil engineer, aware.

## 2020-06-06 NOTE — BH Assessment (Signed)
Comprehensive Clinical Assessment (CCA) Screening, Triage and Referral Note  06/06/2020 Cole Barrett [id number]  Visit Diagnosis:    ICD-10-CM   1. Auditory hallucination  R44.0    Cole Barrett is a 34 year old patient who voluntarily brought himself to the hospital due to internal voices that had been increasingly been causing him concern. Pt states, "I'm trying to get help from these voices. It got bad early on today." Pt states he took his morning medication but he didn't take his night medication because he didn't have them. He states he takes Risperdal in both the morning and at night but that the doses are different. He was unable to specify whether the voices he is experiencing got worse prior to missing his evening dose of medication.  Pt denies SI, HI, VH, NSSIB, access to guns/weapons, engagement with the legal system, or SA. This is pt's 21st visit to the ED since the beginning of 2021; all visits appear to be due to continuous AH, hallucinations, or medication refill.  Pt's protective factors include his ability to advocate for himself and a lack of SI, HI, and VH.  Pt declined to provide verbal consent for clinician to contact friends/family for collateral information.  Pt is oriented x5. His recent/remote memory is intact. Pt was cooperative and friendly throughout the assessment process. Pt's insight, judgement, and impulse control is fair at this time.    Patient Reported Information How did you hear about [place]? Self   Referral name: No data recorded  Referral phone number: No data recorded Whom do you see for routine medical problems? Hospital ER   Practice/Facility Name: No data recorded  Practice/Facility Phone Number: No data recorded  Name of Contact: No data recorded  Contact Number: No data recorded  Contact Fax Number: No data recorded  Prescriber Name: No data recorded  Prescriber Address (if known): No data recorded What Is the Reason for  Your Visit/Call Today? No data recorded How Long Has This Been Causing You Problems? <Week  Have You Recently Been in Any Inpatient Treatment (Hospital/Detox/Crisis Center/28-Day Program)? No   Name/Location of Program/Hospital:No data recorded  How Long Were You There? No data recorded  When Were You Discharged? No data recorded Have You Ever Received Services From [facility] Before? No   Who Do You See at [facility]? No data recorded Have You Recently Had Any Thoughts About Hurting Yourself? No   Are You Planning to Commit Suicide/Harm Yourself At This time?  No  Have you Recently Had Thoughts About Hurting Someone [name]? No   Explanation: No data recorded Have You Used Any Alcohol or Drugs in the Past 24 Hours? No   How Long Ago Did You Use Drugs or Alcohol?  No data recorded  What Did You Use and How Much? No data recorded What Do You Feel Would Help You the Most Today? Other (Comment) (Pt would like to be admitted into the hospital)  Do You Currently Have a Therapist/Psychiatrist? No   Name of Therapist/Psychiatrist: No data recorded  Have You Been Recently Discharged From Any Office Practice or Programs? No   Explanation of Discharge From Practice/Program:  No data recorded    CCA Screening Triage Referral Assessment Type of Contact: Tele-Assessment   Is this Initial or Reassessment? Initial Assessment   Date Telepsych consult ordered in [facility]:  06/05/20   Time Telepsych consult ordered in [facility]:  No data recorded Patient Reported Information Reviewed? Yes   Patient Left Without Being Seen? No  data recorded  Reason for Not Completing Assessment: Pt continues to sleep and is unable to be awoken to participate in [facility] Assessment  Collateral Involvement: No  Does Patient Have a Automotive engineer Guardian? No data recorded  Name and Contact of Legal Guardian:  Self.   If Minor and Not Living with Parent(s), Who has Custody? N/A  Is CPS involved or ever been  involved? Never  Is APS involved or ever been involved? Never  Patient Determined To Be At Risk for Harm To Self or Others Based on Review of Patient Reported Information or Presenting Complaint? No   Method: No data recorded  Availability of Means: No data recorded  Intent: No data recorded  Notification Required: No data recorded  Additional Information for Danger to Others Potential:  No data recorded  Additional Comments for Danger to Others Potential:  No data recorded  Are There Guns or Other Weapons in Your Home?  No data recorded   Types of Guns/Weapons: No data recorded   Are These Weapons Safely Secured?                              No data recorded   Who Could Verify You Are Able To Have These Secured:    No data recorded Do You Have any Outstanding Charges, Pending Court Dates, Parole/Probation? No data recorded Contacted To Inform of Risk of Harm To Self or Others: No data recorded Location of Assessment: [facility] ED  Does Patient Present under Involuntary Commitment? No   IVC Papers Initial File Date: No data recorded  [place] of Residence: [name]  Patient Currently Receiving the Following Services: No data recorded  Determination of Need: No data recorded  Options For Referral: No data recorded  [name], NP, reviewed pt's chart and information and determined pt can be psych cleared at this time. This information was provided to pt's nurse, Ariel RN, via internal messenger, at [phone].  Ralph Dowdy, LMFT

## 2020-06-06 NOTE — ED Provider Notes (Signed)
9:59 AM I was contacted by psychiatric nursing staff to disposition patient, I was unaware that patient needed this position into 9:50 AM.  Patient was found sleeping, snoring.  He reports feeling overall well, is ready for disposition.  He reports he will be able to take a taxi home once he gets his belongings.  According to psychiatric note, patient can follow-up outpatient.  He was medically clear by Dr. Bebe Shaggy.  Physical Exam Vitals and nursing note reviewed.  Constitutional:      Appearance: Normal appearance.  HENT:     Head: Normocephalic and atraumatic.     Mouth/Throat:     Mouth: Mucous membranes are moist.  Pulmonary:     Effort: Pulmonary effort is normal.  Musculoskeletal:     Cervical back: Normal range of motion and neck supple.  Skin:    General: Skin is warm and dry.  Neurological:     Mental Status: He is alert and oriented to person, place, and time.    Patient is stable for disposition at this time.   Claude Manges, PA-C 06/06/20 1000    Linwood Dibbles, MD 06/06/20 1715

## 2020-06-14 ENCOUNTER — Other Ambulatory Visit: Payer: Self-pay

## 2020-06-14 ENCOUNTER — Encounter (HOSPITAL_COMMUNITY): Payer: Self-pay

## 2020-06-14 DIAGNOSIS — F17210 Nicotine dependence, cigarettes, uncomplicated: Secondary | ICD-10-CM | POA: Insufficient documentation

## 2020-06-14 DIAGNOSIS — F1290 Cannabis use, unspecified, uncomplicated: Secondary | ICD-10-CM | POA: Insufficient documentation

## 2020-06-14 DIAGNOSIS — R440 Auditory hallucinations: Secondary | ICD-10-CM | POA: Insufficient documentation

## 2020-06-14 DIAGNOSIS — Z590 Homelessness: Secondary | ICD-10-CM | POA: Insufficient documentation

## 2020-06-14 NOTE — ED Triage Notes (Addendum)
Patient arrived stating that he has been hearing voices and has not taken his Risperdal tonight. Declines SI or HI at this time. Mentioned wanting to be seen by [facility].

## 2020-06-15 ENCOUNTER — Emergency Department (HOSPITAL_COMMUNITY)
Admission: EM | Admit: 2020-06-15 | Discharge: 2020-06-15 | Disposition: A | Discharge status: Home / Self Care | Payer: Self-pay | Attending: Emergency Medicine | Admitting: Emergency Medicine

## 2020-06-15 DIAGNOSIS — R440 Auditory hallucinations: Secondary | ICD-10-CM

## 2020-06-15 MED ORDER — RISPERIDONE 1 MG PO TABS
1.0000 mg | ORAL_TABLET | Freq: Once | ORAL | Status: AC
  Administered 2020-06-15: 1 mg via ORAL
  Filled 2020-06-15: qty 1

## 2020-06-15 MED ORDER — RISPERIDONE 1 MG PO TABS
ORAL_TABLET | ORAL | 1 refills | Status: DC
Start: 2020-06-15 — End: 2020-06-23

## 2020-06-15 MED ORDER — OLANZAPINE 2.5 MG PO TABS
2.5000 mg | ORAL_TABLET | Freq: Every day | ORAL | 0 refills | Status: DC
Start: 2020-06-15 — End: 2020-09-30

## 2020-06-15 NOTE — ED Provider Notes (Signed)
[facility]-EMERGENCY DEPT Provider Note   CSN: [id number] Arrival date & time: 06/14/20  2152     History Chief Complaint  Patient presents with  . Hallucinations    Cole Barrett is a 34 y.o. male.  Patient is often here for auditory hallucinations which she states usually do not bother him.  He couple days have been progressively worsening.  He states are telling him that they are going to kill him.  Obviously this cannot happen and he understands that.  He has no suicidal homicidal ideations.  He states compliance with his medication but has not taken it this evening because he has been here.  No other new hallucinations.  No other new medical problems.   Mental Health Problem      Past Medical History:  Diagnosis Date  . Homelessness   . Hypercholesterolemia   . Mental disorder   . Paranoid behavior (HCC)   . PE (pulmonary embolism)   . Schizophrenia [facility])     Patient Active Problem List   Diagnosis Date Noted  . Acute psychosis (HCC) 08/31/2019  . Schizophrenia (HCC) 08/31/2019  . Schizoaffective disorder, bipolar type (HCC) 01/19/2019  . Alcohol abuse   . Auditory hallucinations   . Alcohol abuse with alcohol-induced mood disorder (HCC) 09/08/2016  . Homelessness 09/03/2016  . Person feigning illness 09/03/2016  . 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 Cannabis use disorder, mild, abuse 08/29/2016  . Pulmonary emboli (HCC) 07/13/2016  . Adjustment disorder with emotional disturbance 03/12/2014  . 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 Acute left flank pain 01/02/2014  . Pulmonary embolism, bilateral (HCC) 01/02/2014    History reviewed. No pertinent surgical history.     Family History  Problem Relation Age of Onset  . Hypertension Mother     Social History   Tobacco Use  . Smoking status: Current Every Day Smoker    Packs/day: 0.50    Types: Cigarettes  . Smokeless tobacco: Never Used    Vaping Use  . Vaping Use: Never used  Substance Use Topics  . Alcohol use: Yes    Comment: BAC not available  . Drug use: Yes    Types: Marijuana    Comment: Last used: 2 months ago UDS NA    Home Medications Prior to Admission medications   Medication Sig Start Date End Date Taking? Authorizing Provider  OLANZapine (ZYPREXA) 2.5 MG tablet Take 1 tablet (2.5 mg total) by mouth at bedtime. 06/15/20   Abhijay Morriss, Barbara Cower, MD  risperiDONE (RISPERDAL) 1 MG tablet Take 1 tablet by mouth each morning.  Take 3 tablets by mouth at bedtime. 06/15/20   Emilyann Banka, Barbara Cower, MD  amantadine (SYMMETREL) 100 MG capsule Take 1 capsule (100 mg total) by mouth 2 (two) times daily. Patient not taking: Reported on 11/04/2019 09/01/19 11/17/19  [name], NP    Allergies    Haldol [haloperidol]  Review of Systems   Review of Systems  All other systems reviewed and are negative.   Physical Exam Updated Vital Signs BP 130/70   Pulse 75   Temp 98.2 F (36.8 C) (Oral)   Resp 18   Ht 5\' 10"  (1.778 m)   Wt 96.3 kg   SpO2 99%   BMI 30.48 kg/m   Physical Exam Vitals and nursing note reviewed.  Constitutional:      Appearance: He is well-developed.  HENT:     Head: Normocephalic and atraumatic.     Mouth/Throat:  Mouth: Mucous membranes are moist.     Pharynx: Oropharynx is clear.  Eyes:     Pupils: Pupils are equal, round, and reactive to light.  Cardiovascular:     Rate and Rhythm: Normal rate.  Pulmonary:     Effort: Pulmonary effort is normal. No respiratory distress.  Abdominal:     General: Abdomen is flat. There is no distension.  Musculoskeletal:        General: Normal range of motion.     Cervical back: Normal range of motion.  Skin:    General: Skin is warm and dry.     Coloration: Skin is not jaundiced.  Neurological:     General: No focal deficit present.     Mental Status: He is alert.     ED Results / Procedures / Treatments   Labs (all labs ordered are listed, but  only abnormal results are displayed) Labs Reviewed - No data to display  EKG None  Radiology No results found.  Procedures Procedures (including critical care time)  Medications Ordered in ED Medications  risperiDONE (RISPERDAL) tablet 1 mg (1 mg Oral Given 06/15/20 0220)    ED Course  I have reviewed the triage vital signs and the nursing notes.  Pertinent labs & imaging results that were available during my care of the patient were reviewed by me and considered in my medical decision making (see chart for details).    MDM Rules/Calculators/A&P                          Pleasant 34 year old male often comes here with hallucinations.  He has no new medical problems to warrant any lab work-up.  He request to speak to the psychiatry team and I will oblige however I think he is likely stable for discharge as long as They feel like he is similar to his baseline.  We will going give him a dose of his Risperdal.  Final Clinical Impression(s) / ED Diagnoses Final diagnoses:  Auditory hallucination    Rx / DC Orders ED Discharge Orders         Ordered    risperiDONE (RISPERDAL) 1 MG tablet     Discontinue  Reprint     06/15/20 0250    OLANZapine (ZYPREXA) 2.5 MG tablet  Daily at bedtime     Discontinue  Reprint     06/15/20 0250           Masato Pettie, Barbara Cower, MD 06/15/20 6945

## 2020-06-15 NOTE — BHH Counselor (Signed)
Clinician spoke to [facility], Press photographer to discuss an error with the TTS cart. Charge Nurse took clinicians' contact information.    Redmond Pulling, MS, [facility], [facility] Triage Specialist [phone].

## 2020-06-15 NOTE — BH Assessment (Addendum)
Comprehensive Clinical Assessment (CCA) Note  06/15/2020 Cole Barrett [id number]  [name] who presents voluntary and accompanied to [facility]. Clinician asked the pt, "what brought you to the hospital?" Pt reported, he's been hearing voices telling him to kill himself. Pt reported, the voices has worsened. Pt denies, SI, HI, AVH, self-injurious behaviors and access to weapons.  Pt denies, substance use. Pt's UDS is pending. Pt denies, being linked to OPT resources (medication management and/or counseling.) Pt reported, he gets his Risperdal at the hospital.    Pt's assessment was completed via phone. Pt attitude towards examiner is cooperative. Pt's speech was clear and coherent. Pt's organization was coherent and linear. Pt reported, if discharged from [facility] he could contract for safety.   Diagnosis: Schizophrenia (HCC)  Disposition: [name], NP recommends pt does not meet inpatient treatment criteria. Disposition discussed with Dr. Clayborne Dana and Roseanna Rainbow, Charge Nurse. ED to provide OPT resources for the pt to follow up.    Visit Diagnosis:      ICD-10-CM   1. Auditory hallucination  R44.0      CCA Screening, Triage and Referral (STR)  Patient Reported Information How did you hear about [place]? Self  Referral name: No data recorded Referral phone number: No data recorded  Whom do you see for routine medical problems? Hospital ER  Practice/Facility Name: No data recorded Practice/Facility Phone Number: No data recorded Name of Contact: No data recorded Contact Number: No data recorded Contact Fax Number: No data recorded Prescriber Name: No data recorded Prescriber Address (if known): No data recorded  What Is the Reason for Your Visit/Call Today? No data recorded How Long Has This Been Causing You Problems? <Week  What Do You Feel Would Help You the Most Today? Other (Comment) (Pt would like to be admitted into the hospital)   Have You Recently Been in  Any Inpatient Treatment (Hospital/Detox/Crisis Center/28-Day Program)? No  Name/Location of Program/Hospital:No data recorded How Long Were You There? No data recorded When Were You Discharged? No data recorded  Have You Ever Received Services From [facility] Before? No  Who Do You See at [facility]? No data recorded  Have You Recently Had Any Thoughts About Hurting Yourself? No  Are You Planning to Commit Suicide/Harm Yourself At This time? No   Have you Recently Had Thoughts About Hurting Someone [name]? No  Explanation: No data recorded  Have You Used Any Alcohol or Drugs in the Past 24 Hours? No  How Long Ago Did You Use Drugs or Alcohol? No data recorded What Did You Use and How Much? No data recorded  Do You Currently Have a Therapist/Psychiatrist? No  Name of Therapist/Psychiatrist: No data recorded  Have You Been Recently Discharged From Any Office Practice or Programs? No  Explanation of Discharge From Practice/Program: No data recorded    CCA Screening Triage Referral Assessment Type of Contact: Tele-Assessment  Is this Initial or Reassessment? Initial Assessment  Date Telepsych consult ordered in [facility]:  06/05/20  Time Telepsych consult ordered in [facility]:  2313   Patient Reported Information Reviewed? Yes  Patient Left Without Being Seen? No data recorded Reason for Not Completing Assessment: Pt continues to sleep and is unable to be awoken to participate in [facility] Assessment   Collateral Involvement: Pt declined to provide clinician verbal consent to contact a friend or family member for collateral information   Does Patient Have a Automotive engineer Guardian? No data recorded Name and Contact of Legal Guardian: Self.  If Minor and Not Living with Parent(s), Who has Custody? N/A  Is CPS involved or ever been involved? Never  Is APS involved or ever been involved? Never   Patient Determined To Be At Risk for Harm To Self or Others Based on Review of  Patient Reported Information or Presenting Complaint? No  Method: No data recorded Availability of Means: No data recorded Intent: No data recorded Notification Required: No data recorded Additional Information for Danger to Others Potential: No data recorded Additional Comments for Danger to Others Potential: No data recorded Are There Guns or Other Weapons in Your Home? No data recorded Types of Guns/Weapons: No data recorded Are These Weapons Safely Secured?                            No data recorded Who Could Verify You Are Able To Have These Secured: No data recorded Do You Have any Outstanding Charges, Pending Court Dates, Parole/Probation? No data recorded Contacted To Inform of Risk of Harm To Self or Others: Other: Comment (N/A)   Location of Assessment: [facility] ED   Does Patient Present under Involuntary Commitment? No  IVC Papers Initial File Date: No data recorded  [place] of Residence: Cole Barrett   Patient Currently Receiving the Following Services: Medication Management   Determination of Need: Routine (7 days)   Options For Referral: Medication Management    CCA Biopsychosocial  Intake/Chief Complaint:  CCA Intake With Chief Complaint CCA Part Two Date: 06/15/20 CCA Part Two Time: 0139 Chief Complaint/Presenting Problem: Worsening auditory hallucinations. Patient's Currently Reported Symptoms/Problems: Worsening auditory hallucinations. Individual's Strengths: Pt reported, relaxing, watching TV. Individual's Preferences: Pt re Type of Services Patient Feels Are Needed: Pt reported, wanting to see a counselor or doctor, wanting to be watched for a while.  Mental Health Symptoms Depression:  Depression: None (Pt denies.)  Mania:  Mania: None (Pt denies.)  Anxiety:   Anxiety: None  Psychosis:  Psychosis: Hallucinations (Pt denies.)  Trauma:  Trauma: None (Pt denies.)  Obsessions:  Obsessions: None (Pt denies.)  Compulsions:  Compulsions: None  Inattention:   Inattention: None  Hyperactivity/Impulsivity:  Hyperactivity/Impulsivity: N/A  Oppositional/Defiant Behaviors:  Oppositional/Defiant Behaviors: None  Emotional Irregularity:     Other Mood/Personality Symptoms:      Mental Status Exam Appearance and self-care  Stature:     Weight:     Clothing:  Clothing:  (Assessment completed via phone.)  Grooming:  Grooming:  (Assessment completed via phone.)  Cosmetic use:  Cosmetic Use:  (Assessment completed via phone.)  Posture/gait:  Posture/Gait:  (Assessment completed via phone.)  Motor activity:  Motor Activity:  (Assessment completed via phone.)  Sensorium  Attention:  Attention: Normal  Concentration:  Concentration: Normal  Orientation:  Orientation: X5  Recall/memory:  Recall/Memory: Normal  Affect and Mood  Affect:  Affect: Other (Comment) (Assessment completed via phone.)  Mood:  Mood: Other (Comment) (Pleasant.)  Relating  Eye contact:  Eye Contact:  (Assessment completed via phone.)  Facial expression:  Facial Expression:  (Assessment completed via phone.)  Attitude toward examiner:  Attitude Toward Examiner: Cooperative  Thought and Language  Speech flow: Speech Flow: Clear and Coherent  Thought content:  Thought Content: Appropriate to Mood and Circumstances  Preoccupation:  Preoccupations: None  Hallucinations:  Hallucinations: Auditory  Organization:     Company secretary of Knowledge:  Fund of Knowledge: Good  Intelligence:  Intelligence: Average  Abstraction:  Abstraction:  (UTA)  Judgement:  Judgement: Good  Reality Testing:  Reality Testing:  (UTA)  Insight:  Insight: Fair  Decision Making:  Decision Making:  31(UTA)  Social Functioning  Social Maturity:  Social Maturity:  Industrial/product designer(UTA)  Social Judgement:  Social Judgement:  (UTA)  Stress  Stressors:  Stressors: Other (Comment) (Pt reported,voices are worsening.)  Coping Ability:  Coping Ability:  (UTA)  Skill Deficits:  Skill Deficits:  (UTA)  Supports:   Supports:  (UTA)     Religion: Religion/Spirituality Are You A Religious Person?: No  Leisure/Recreation: Leisure / Recreation Do You Have Hobbies?: No  Exercise/Diet: Exercise/Diet Do You Exercise?: No Do You Follow a Special Diet?: No Do You Have Any Trouble Sleeping?: No   CCA Employment/Education  Employment/Work Situation: Employment / Work Situation Employment situation: Employed Where is patient currently employed?: [employer]. Patient's job has been impacted by current illness:  (NA) Has patient ever been in the [employer] and Companymilitary?: No  Education: Education Is Patient Currently Attending School?: No Did Garment/textile technologistYou Graduate From [employer] School?: Yes Did You Attend College?: No Did You Attend Graduate School?: No Did You Have An Individualized Education Program (IIEP):  (NA) Did You Have Any Difficulty At School?:  (NA) Patient's Education Has Been Impacted by Current Illness:  (NA)   CCA Family/Childhood History  Family and Relationship History: Family history Marital status: Single Does patient have children?: No  Childhood History:  Childhood History Additional childhood history information: NA Description of patient's relationship with caregiver when they were a child: NA Patient's description of current relationship with people who raised him/her: NA How were you disciplined when you got in trouble as a child/adolescent?: NA Does patient have siblings?:  (NA) Did patient suffer any verbal/emotional/physical/sexual abuse as a child?: No Did patient suffer from severe childhood neglect?: No Has patient ever been sexually abused/assaulted/raped as an adolescent or adult?: No Was the patient ever a victim of a crime or a disaster?: No Witnessed domestic violence?: No Has patient been affected by domestic violence as an adult?: No  Child/Adolescent Assessment:     CCA Substance Use  Alcohol/Drug Use: Alcohol / Drug Use Pain Medications: See MAR Prescriptions:  See MAR Over the Counter: See MAR History of alcohol / drug use?: No history of alcohol / drug abuse      ASAM's:  Six Dimensions of Multidimensional Assessment  Dimension 1:  Acute Intoxication and/or Withdrawal Potential:      Dimension 2:  Biomedical Conditions and Complications:      Dimension 3:  Emotional, Behavioral, or Cognitive Conditions and Complications:     Dimension 4:  Readiness to Change:     Dimension 5:  Relapse, Continued use, or Continued Problem Potential:     Dimension 6:  Recovery/Living Environment:     ASAM Severity Score:    ASAM Recommended Level of Treatment:     Substance use Disorder (SUD)    Recommendations for Services/Supports/Treatments: Recommendations for Services/Supports/Treatments Recommendations For Services/Supports/Treatments: Individual Therapy, Medication Management  DSM5 Diagnoses: Patient Active Problem List   Diagnosis Date Noted  . Acute psychosis (HCC) 08/31/2019  . Schizophrenia (HCC) 08/31/2019  . Schizoaffective disorder, bipolar type (HCC) 01/19/2019  . Alcohol abuse   . Auditory hallucinations   . Alcohol abuse with alcohol-induced mood disorder (HCC) 09/08/2016  . Homelessness 09/03/2016  . Person feigning illness 09/03/2016  . 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 Cannabis use disorder, mild, abuse 08/29/2016  . Pulmonary emboli (HCC) 07/13/2016  . Adjustment disorder with emotional disturbance 03/12/2014  .  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 Acute left flank pain 01/02/2014  . Pulmonary embolism, bilateral (HCC) 01/02/2014    Referrals to Alternative Service(s): Referred to Alternative Service(s):   Place:   Date:   Time:    Referred to Alternative Service(s):   Place:   Date:   Time:    Referred to Alternative Service(s):   Place:   Date:   Time:    Referred to Alternative Service(s):   Place:   Date:   Time:     Redmond Pulling  Comprehensive Clinical Assessment (CCA) Screening,  Triage and Referral Note  06/15/2020 Averey Trompeter [id number]  Visit Diagnosis:    ICD-10-CM   1. Auditory hallucination  R44.0     Patient Reported Information How did you hear about [place]? Self   Referral name: No data recorded  Referral phone number: No data recorded Whom do you see for routine medical problems? Hospital ER   Practice/Facility Name: No data recorded  Practice/Facility Phone Number: No data recorded  Name of Contact: No data recorded  Contact Number: No data recorded  Contact Fax Number: No data recorded  Prescriber Name: No data recorded  Prescriber Address (if known): No data recorded What Is the Reason for Your Visit/Call Today? No data recorded How Long Has This Been Causing You Problems? <Week  Have You Recently Been in Any Inpatient Treatment (Hospital/Detox/Crisis Center/28-Day Program)? No   Name/Location of Program/Hospital:No data recorded  How Long Were You There? No data recorded  When Were You Discharged? No data recorded Have You Ever Received Services From [facility] Before? No   Who Do You See at [facility]? No data recorded Have You Recently Had Any Thoughts About Hurting Yourself? No   Are You Planning to Commit Suicide/Harm Yourself At This time?  No  Have you Recently Had Thoughts About Hurting Someone [name]? No   Explanation: No data recorded Have You Used Any Alcohol or Drugs in the Past 24 Hours? No   How Long Ago Did You Use Drugs or Alcohol?  No data recorded  What Did You Use and How Much? No data recorded What Do You Feel Would Help You the Most Today? Other (Comment) (Pt would like to be admitted into the hospital)  Do You Currently Have a Therapist/Psychiatrist? No   Name of Therapist/Psychiatrist: No data recorded  Have You Been Recently Discharged From Any Office Practice or Programs? No   Explanation of Discharge From Practice/Program:  No data recorded    CCA Screening Triage Referral Assessment Type  of Contact: Tele-Assessment   Is this Initial or Reassessment? Initial Assessment   Date Telepsych consult ordered in [facility]:  06/05/20   Time Telepsych consult ordered in [facility]:  2313  Patient Reported Information Reviewed? Yes   Patient Left Without Being Seen? No data recorded  Reason for Not Completing Assessment: Pt continues to sleep and is unable to be awoken to participate in [facility] Assessment  Collateral Involvement: Pt declined to provide clinician verbal consent to contact a friend or family member for collateral information  Does Patient Have a Automotive engineer Guardian? No data recorded  Name and Contact of Legal Guardian:  Self.   If Minor and Not Living with Parent(s), Who has Custody? N/A  Is CPS involved or ever been involved? Never  Is APS involved or ever been involved? Never  Patient Determined To Be At Risk for Harm To Self or Others Based on Review of  Patient Reported Information or Presenting Complaint? No   Method: No data recorded  Availability of Means: No data recorded  Intent: No data recorded  Notification Required: No data recorded  Additional Information for Danger to Others Potential:  No data recorded  Additional Comments for Danger to Others Potential:  No data recorded  Are There Guns or Other Weapons in Your Home?  No data recorded   Types of Guns/Weapons: No data recorded   Are These Weapons Safely Secured?                              No data recorded   Who Could Verify You Are Able To Have These Secured:    No data recorded Do You Have any Outstanding Charges, Pending Court Dates, Parole/Probation? No data recorded Contacted To Inform of Risk of Harm To Self or Others: Other: Comment (N/A)  Location of Assessment: [facility] ED  Does Patient Present under Involuntary Commitment? No   IVC Papers Initial File Date: No data recorded  [place] of Residence: Cole Barrett  Patient Currently Receiving the Following Services: Medication  Management   Determination of Need: Routine (7 days)   Options For Referral: Medication Management   Redmond Pulling, [facility]   Redmond Pulling, MS, [facility], [facility] Triage Specialist [phone]

## 2020-06-15 NOTE — BH Assessment (Signed)
Clinician called TTS cart and received this error message: "cannot connect call."   Marchelle Folks, NT/NS to move TTS cart to another place in the pt's room.   Redmond Pulling, MS, [facility], [facility] Triage Specialist [phone]

## 2020-06-22 ENCOUNTER — Other Ambulatory Visit: Payer: Self-pay

## 2020-06-22 ENCOUNTER — Encounter (HOSPITAL_COMMUNITY): Payer: Self-pay

## 2020-06-22 DIAGNOSIS — F17210 Nicotine dependence, cigarettes, uncomplicated: Secondary | ICD-10-CM | POA: Insufficient documentation

## 2020-06-22 DIAGNOSIS — Z760 Encounter for issue of repeat prescription: Secondary | ICD-10-CM | POA: Insufficient documentation

## 2020-06-22 NOTE — ED Triage Notes (Signed)
Pt sts auditory hallucinations. Pt requesting a refill of Risperdal.

## 2020-06-23 ENCOUNTER — Emergency Department (HOSPITAL_COMMUNITY)
Admission: EM | Admit: 2020-06-23 | Discharge: 2020-06-23 | Disposition: A | Discharge status: Home / Self Care | Payer: Self-pay | Attending: Emergency Medicine | Admitting: Emergency Medicine

## 2020-06-23 DIAGNOSIS — Z760 Encounter for issue of repeat prescription: Secondary | ICD-10-CM

## 2020-06-23 MED ORDER — RISPERIDONE 1 MG PO TABS
1.0000 mg | ORAL_TABLET | Freq: Once | ORAL | Status: AC
  Administered 2020-06-23: 1 mg via ORAL
  Filled 2020-06-23: qty 1

## 2020-06-23 MED ORDER — RISPERIDONE 1 MG PO TABS
ORAL_TABLET | ORAL | 1 refills | Status: DC
Start: 2020-06-23 — End: 2020-09-26

## 2020-06-23 NOTE — ED Provider Notes (Signed)
[facility]-EMERGENCY DEPT Provider Note   CSN: [id number] Arrival date & time: 06/22/20  2255     History Chief Complaint  Patient presents with  . Medication Refill    Cole Barrett is a 34 y.o. male.  With a PMHx of mental disorder and schizophrenia who requests a dose of Risperdal for his auditory hallucinations. He notes the voices are telling him that they are going to kill him. Patient notes he does not believe they will kill him. He denies the voices telling him to harm others. He denies any thoughts of harming himself or others. He states he is followed regularly for his psychiatric history with [facility], however, he is unsure of when his next appointment is. Patient denies wanting to talk with psychiatry while he is here, he wants a dose of Risperdal and that is all. Denies needing his olanzapine. Patient seen in the ED 6 days ago for needing risperdal refill, written prescription given to the patient, however he states he lost it.   Patient denies trouble swallowing, chest pain, back pain, shortness of breath, cough, abdominal pain, nausea, vomiting, diarrhea, leg pain.   The history is provided by the patient.  Medication Refill Medications/supplies requested:  Risperdal dose Reason for request:  Medications ran out Medications taken before: yes - see home medications        Past Medical History:  Diagnosis Date  . Homelessness   . Hypercholesterolemia   . Mental disorder   . Paranoid behavior (HCC)   . PE (pulmonary embolism)   . Schizophrenia [facility])     Patient Active Problem List   Diagnosis Date Noted  . Acute psychosis (HCC) 08/31/2019  . Schizophrenia (HCC) 08/31/2019  . Schizoaffective disorder, bipolar type (HCC) 01/19/2019  . Alcohol abuse   . Auditory hallucinations   . Alcohol abuse with alcohol-induced mood disorder (HCC) 09/08/2016  . Homelessness 09/03/2016  . Person feigning illness 09/03/2016  . Brief psychotic  disorder (HCC) 08/29/2016  . Cannabis use disorder, mild, abuse 08/29/2016  . Pulmonary emboli (HCC) 07/13/2016  . Adjustment disorder with emotional disturbance 03/12/2014  . 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 Acute left flank pain 01/02/2014  . Pulmonary embolism, bilateral (HCC) 01/02/2014    History reviewed. No pertinent surgical history.     Family History  Problem Relation Age of Onset  . Hypertension Mother     Social History   Tobacco Use  . Smoking status: Current Every Day Smoker    Packs/day: 0.50    Types: Cigarettes  . Smokeless tobacco: Never Used  Vaping Use  . Vaping Use: Never used  Substance Use Topics  . Alcohol use: Yes    Comment: BAC not available  . Drug use: Yes    Types: Marijuana    Comment: Last used: 2 months ago UDS NA    Home Medications Prior to Admission medications   Medication Sig Start Date End Date Taking? Authorizing Provider  OLANZapine (ZYPREXA) 2.5 MG tablet Take 1 tablet (2.5 mg total) by mouth at bedtime. 06/15/20   Mesner, Barbara Cower, MD  risperiDONE (RISPERDAL) 1 MG tablet Take 1 tablet by mouth each morning.  Take 3 tablets by mouth at bedtime. 06/23/20   Ayona Yniguez, MD  amantadine (SYMMETREL) 100 MG capsule Take 1 capsule (100 mg total) by mouth 2 (two) times daily. Patient not taking: Reported on 11/04/2019 09/01/19 11/17/19  [name], NP    Allergies    Haldol [haloperidol]  Review of Systems   Review of Systems  Constitutional: Positive for fever. Negative for chills.  HENT: Negative for trouble swallowing.   Respiratory: Negative for cough and shortness of breath.   Cardiovascular: Negative for chest pain.  Gastrointestinal: Negative for abdominal pain, diarrhea, nausea and vomiting.  Musculoskeletal: Negative for back pain and neck pain.  Psychiatric/Behavioral: Positive for hallucinations (auditory).  All other systems reviewed and are negative.   Physical Exam Updated Vital  Signs BP (!) 143/82   Pulse 67   Temp 98.1 F (36.7 C)   Resp 18   Ht 5\' 10"  (1.778 m)   Wt (!) 99.8 kg   SpO2 100%   BMI 31.57 kg/m   Physical Exam Vitals and nursing note reviewed.  Constitutional:      General: He is not in acute distress.    Appearance: Normal appearance. He is not ill-appearing or toxic-appearing.  HENT:     Head: Normocephalic and atraumatic.  Cardiovascular:     Rate and Rhythm: Normal rate and regular rhythm.     Pulses: Normal pulses.     Heart sounds: Normal heart sounds. No murmur heard.   Pulmonary:     Effort: Pulmonary effort is normal. No respiratory distress.     Breath sounds: Normal breath sounds. No stridor. No wheezing, rhonchi or rales.  Abdominal:     General: There is no distension.     Palpations: Abdomen is soft.     Tenderness: There is no abdominal tenderness. There is no guarding or rebound.  Musculoskeletal:        General: Normal range of motion.     Cervical back: Normal range of motion and neck supple. No tenderness.     Right lower leg: No edema.     Left lower leg: No edema.  Skin:    General: Skin is warm and dry.     Coloration: Skin is not jaundiced.  Neurological:     General: No focal deficit present.     Mental Status: He is alert and oriented to person, place, and time.  Psychiatric:        Mood and Affect: Mood normal.        Behavior: Behavior normal.     Comments: Poor eye contact     ED Results / Procedures / Treatments   Labs (all labs ordered are listed, but only abnormal results are displayed) Labs Reviewed - No data to display  EKG None  Radiology No results found.  Procedures Procedures (including critical care time)  Medications Ordered in ED Medications  risperiDONE (RISPERDAL) tablet 1 mg (1 mg Oral Given 06/23/20 0847)    ED Course  I have reviewed the triage vital signs and the nursing notes.  Pertinent labs & imaging results that were available during my care of the patient  were reviewed by me and considered in my medical decision making (see chart for details).    MDM Rules/Calculators/A&P                          Cole Barrett is a 34 y/o M with a PMHx of auditory hallucinations, schizophrenia, he is followed by 31, who presents to the ED with auditory hallucinations and need to refill his Risperdal. He states the voices are telling him they are going to kill him. He does not believe they are going to kill him. He denies any thoughts of harming himself or others. He denies wanting  to be seen by psychiatry. He denies needing a refill of his Zyprexa.   Do not believe patient is a harm to himself or others at this time. Gave patient 1mg  of Risperdal and wrote a prescription as well. He notes he lost the prescription for Risperdal written by Dr. 6 days ago.   Instructed patient to follow up with [facility] and to return to the ED if his hallucinations worsen or new symptoms arise.  Final Clinical Impression(s) / ED Diagnoses Final diagnoses:  Medication refill    Rx / DC Orders ED Discharge Orders         Ordered    risperiDONE (RISPERDAL) 1 MG tablet     Discontinue  Reprint     06/23/20 0839           06/25/20, MD 06/23/20 06/25/20    [id number], MD 06/23/20 1531

## 2020-06-23 NOTE — Discharge Instructions (Addendum)
Thank you for allowing [place] to assist in your care. Please follow up with [facility] to see your psychiatrist for your medication refills.   If any new symptoms arise or your auditory hallucinations worsen, please return to the ED.

## 2020-06-30 DIAGNOSIS — G479 Sleep disorder, unspecified: Secondary | ICD-10-CM | POA: Insufficient documentation

## 2020-06-30 DIAGNOSIS — Z5321 Procedure and treatment not carried out due to patient leaving prior to being seen by health care provider: Secondary | ICD-10-CM | POA: Insufficient documentation

## 2020-07-01 ENCOUNTER — Emergency Department (HOSPITAL_COMMUNITY)
Admission: EM | Admit: 2020-07-01 | Discharge: 2020-07-01 | Disposition: A | Discharge status: Home / Self Care | Payer: Self-pay | Attending: Emergency Medicine | Admitting: Emergency Medicine

## 2020-07-01 NOTE — ED Notes (Signed)
Pt told registration staff that he had to leave and go to work. Pt not visualized in WR.

## 2020-07-01 NOTE — ED Triage Notes (Signed)
Patient reports he is hearing voices. Seen here multiple times a week for same. Needs a place to sleep.

## 2020-07-01 NOTE — ED Notes (Signed)
Pt asleep and refusing vitals  

## 2020-07-11 ENCOUNTER — Other Ambulatory Visit: Payer: Self-pay

## 2020-07-12 ENCOUNTER — Emergency Department (HOSPITAL_COMMUNITY): Admission: EM | Admit: 2020-07-12 | Discharge: 2020-07-12 | Discharge status: Absent without leave | Payer: Self-pay

## 2020-09-15 ENCOUNTER — Emergency Department (HOSPITAL_COMMUNITY)
Admission: EM | Admit: 2020-09-15 | Discharge: 2020-09-16 | Disposition: A | Discharge status: Home / Self Care | Payer: Self-pay | Attending: Emergency Medicine | Admitting: Emergency Medicine

## 2020-09-15 ENCOUNTER — Encounter (HOSPITAL_COMMUNITY): Payer: Self-pay

## 2020-09-15 ENCOUNTER — Other Ambulatory Visit: Payer: Self-pay

## 2020-09-15 DIAGNOSIS — R440 Auditory hallucinations: Secondary | ICD-10-CM | POA: Insufficient documentation

## 2020-09-15 DIAGNOSIS — F17210 Nicotine dependence, cigarettes, uncomplicated: Secondary | ICD-10-CM | POA: Insufficient documentation

## 2020-09-15 DIAGNOSIS — Z20822 Contact with and (suspected) exposure to covid-19: Secondary | ICD-10-CM | POA: Insufficient documentation

## 2020-09-15 LAB — CBC
HCT: 40.9 % (ref 39.0–52.0)
Hemoglobin: 13.4 g/dL (ref 13.0–17.0)
MCH: 28.3 pg (ref 26.0–34.0)
MCHC: 32.8 g/dL (ref 30.0–36.0)
MCV: 86.5 fL (ref 80.0–100.0)
Platelets: 296 10*3/uL (ref 150–400)
RBC: 4.73 MIL/uL (ref 4.22–5.81)
RDW: 12.6 % (ref 11.5–15.5)
WBC: 4.8 10*3/uL (ref 4.0–10.5)
nRBC: 0 % (ref 0.0–0.2)

## 2020-09-15 LAB — RESPIRATORY PANEL BY RT PCR (FLU A&B, COVID)
Influenza A by PCR: NEGATIVE
Influenza B by PCR: NEGATIVE
SARS Coronavirus 2 by RT PCR: NEGATIVE

## 2020-09-15 LAB — COMPREHENSIVE METABOLIC PANEL
ALT: 49 U/L — ABNORMAL HIGH (ref 0–44)
AST: 33 U/L (ref 15–41)
Albumin: 4.1 g/dL (ref 3.5–5.0)
Alkaline Phosphatase: 59 U/L (ref 38–126)
Anion gap: 12 (ref 5–15)
BUN: 10 mg/dL (ref 6–20)
CO2: 22 mmol/L (ref 22–32)
Calcium: 8.8 mg/dL — ABNORMAL LOW (ref 8.9–10.3)
Chloride: 104 mmol/L (ref 98–111)
Creatinine, Ser: 0.96 mg/dL (ref 0.61–1.24)
GFR, Estimated: >60 mL/min (ref >60)
Glucose, Bld: 90 mg/dL (ref 70–99)
Potassium: 3.5 mmol/L (ref 3.5–5.1)
Sodium: 138 mmol/L (ref 135–145)
Total Bilirubin: 0.8 mg/dL (ref 0.3–1.2)
Total Protein: 7.5 g/dL (ref 6.5–8.1)

## 2020-09-15 LAB — RAPID URINE DRUG SCREEN, HOSP PERFORMED
Amphetamines: NOT DETECTED
Barbiturates: NOT DETECTED
Benzodiazepines: NOT DETECTED
Cocaine: NOT DETECTED
Opiates: NOT DETECTED
Tetrahydrocannabinol: NOT DETECTED

## 2020-09-15 LAB — SALICYLATE LEVEL: Salicylate Lvl: <7 mg/dL — ABNORMAL LOW (ref 7.0–30.0)

## 2020-09-15 LAB — ETHANOL: Alcohol, Ethyl (B): 29 mg/dL — ABNORMAL HIGH (ref <10)

## 2020-09-15 LAB — ACETAMINOPHEN LEVEL: Acetaminophen (Tylenol), Serum: <10 ug/mL — ABNORMAL LOW (ref 10–30)

## 2020-09-15 MED ORDER — OLANZAPINE 2.5 MG PO TABS
2.5000 mg | ORAL_TABLET | Freq: Every day | ORAL | Status: DC
  Administered 2020-09-15: 2.5 mg via ORAL
  Filled 2020-09-15: qty 1

## 2020-09-15 NOTE — ED Provider Notes (Signed)
[facility]-EMERGENCY DEPT Provider Note   CSN: [id number] Arrival date & time: 09/15/20  2022     History Chief Complaint  Patient presents with  . Hallucinations    Espen Bethel is a 34 y.o. male.  HPI   Patient with significant medical history of paranoia, schizophrenia, PE presents to the emergency department with chief complaint of hearing voices.  Patient states over the last few days he has been hearing voices in his head telling him that "they are going to kill him".  He states he has heard voices in the past but it is getting worse.  He denies homicidal or suicidal ideations, denies delusions.  He endorses that he has been taking all of his medications as prescribed.  He has no complaints at this time and states he would like to speak with psychiatry to help with the voices that he is hearing in his head.  He denies headache, fever, chills, shortness of breath, chest pain, dumping, nausea, vomiting, diarrhea, pedal edema.  Past Medical History:  Diagnosis Date  . Homelessness   . Hypercholesterolemia   . Mental disorder   . Paranoid behavior (HCC)   . PE (pulmonary embolism)   . Schizophrenia [facility])     Patient Active Problem List   Diagnosis Date Noted  . Acute psychosis (HCC) 08/31/2019  . Schizophrenia (HCC) 08/31/2019  . Schizoaffective disorder, bipolar type (HCC) 01/19/2019  . Alcohol abuse   . Auditory hallucinations   . Alcohol abuse with alcohol-induced mood disorder (HCC) 09/08/2016  . Homelessness 09/03/2016  . Person feigning illness 09/03/2016  . 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 Cannabis use disorder, mild, abuse 08/29/2016  . Pulmonary emboli (HCC) 07/13/2016  . Adjustment disorder with emotional disturbance 03/12/2014  . 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 Acute left flank pain 01/02/2014  . Pulmonary embolism, bilateral (HCC) 01/02/2014    History reviewed. No pertinent surgical  history.     Family History  Problem Relation Age of Onset  . Hypertension Mother     Social History   Tobacco Use  . Smoking status: Current Every Day Smoker    Packs/day: 0.50    Types: Cigarettes  . Smokeless tobacco: Never Used  Vaping Use  . Vaping Use: Never used  Substance Use Topics  . Alcohol use: Yes    Comment: BAC not available  . Drug use: Yes    Types: Marijuana    Comment: Last used: 2 months ago UDS NA    Home Medications Prior to Admission medications   Medication Sig Start Date End Date Taking? Authorizing Provider  risperiDONE (RISPERDAL) 1 MG tablet Take 1 tablet by mouth each morning.  Take 3 tablets by mouth at bedtime. 06/23/20  Yes Katsadouros, Vasilios, MD  OLANZapine (ZYPREXA) 2.5 MG tablet Take 1 tablet (2.5 mg total) by mouth at bedtime. 06/15/20   Mesner, Barbara Cower, MD  amantadine (SYMMETREL) 100 MG capsule Take 1 capsule (100 mg total) by mouth 2 (two) times daily. Patient not taking: Reported on 11/04/2019 09/01/19 11/17/19  [name], NP    Allergies    Haldol [haloperidol]  Review of Systems   Review of Systems  Constitutional: Negative for chills and fever.  HENT: Negative for congestion, trouble swallowing and voice change.   Eyes: Negative for visual disturbance.  Respiratory: Negative for shortness of breath.   Cardiovascular: Negative for chest pain.  Gastrointestinal: Negative for abdominal pain, diarrhea, nausea and vomiting.  Genitourinary: Negative  for enuresis, flank pain and frequency.  Musculoskeletal: Negative for back pain.  Skin: Negative for rash.  Neurological: Negative for dizziness and headaches.  Hematological: Does not bruise/bleed easily.  Psychiatric/Behavioral: Negative for agitation, behavioral problems, self-injury and suicidal ideas.    Physical Exam Updated Vital Signs BP 121/86 (BP Location: Right Arm)   Pulse (!) 102   Temp 98.1 F (36.7 C) (Oral)   Resp 18   Ht 5\' 10"  (1.778 m)   Wt 99.8 kg    SpO2 95%   BMI 31.57 kg/m   Physical Exam Vitals and nursing note reviewed.  Constitutional:      General: He is not in acute distress.    Appearance: He is not ill-appearing.  HENT:     Head: Normocephalic and atraumatic.     Nose: No congestion.     Mouth/Throat:     Mouth: Mucous membranes are moist.     Pharynx: Oropharynx is clear.  Eyes:     General: No scleral icterus. Cardiovascular:     Rate and Rhythm: Normal rate and regular rhythm.     Pulses: Normal pulses.     Heart sounds: No murmur heard.  No friction rub. No gallop.   Pulmonary:     Effort: No respiratory distress.     Breath sounds: No wheezing, rhonchi or rales.  Abdominal:     General: There is no distension.     Palpations: Abdomen is soft.     Tenderness: There is no abdominal tenderness. There is no right CVA tenderness, left CVA tenderness or guarding.  Musculoskeletal:        General: No swelling.     Right lower leg: No edema.     Left lower leg: No edema.  Skin:    General: Skin is warm and dry.     Capillary Refill: Capillary refill takes less than 2 seconds.     Findings: No rash.  Neurological:     General: No focal deficit present.     Mental Status: He is alert.  Psychiatric:        Mood and Affect: Mood normal.     ED Results / Procedures / Treatments   Labs (all labs ordered are listed, but only abnormal results are displayed) Labs Reviewed  COMPREHENSIVE METABOLIC PANEL - Abnormal; Notable for the following components:      Result Value   Calcium 8.8 (*)    ALT 49 (*)    All other components within normal limits  ETHANOL - Abnormal; Notable for the following components:   Alcohol, Ethyl (B) 29 (*)    All other components within normal limits  SALICYLATE LEVEL - Abnormal; Notable for the following components:   Salicylate Lvl <7.0 (*)    All other components within normal limits  ACETAMINOPHEN LEVEL - Abnormal; Notable for the following components:   Acetaminophen  (Tylenol), Serum <10 (*)    All other components within normal limits  RESPIRATORY PANEL BY RT PCR (FLU A&B, COVID)  CBC  RAPID URINE DRUG SCREEN, HOSP PERFORMED    EKG None  Radiology No results found.  Procedures Procedures (including critical care time)  Medications Ordered in ED Medications  OLANZapine (ZYPREXA) tablet 2.5 mg (2.5 mg Oral Given 09/15/20 2212)    ED Course  I have reviewed the triage vital signs and the nursing notes.  Pertinent labs & imaging results that were available during my care of the patient were reviewed by me and considered in my  medical decision making (see chart for details).    MDM Rules/Calculators/A&P                          I have personally reviewed all imaging, labs and have interpreted them.  Patient presents with hearing voices in his head.  He does not appear to be in acute distress, vital signs reassuring.  Will order screening labs for further evaluation management.  Will also consult TTS for further eval.  CBC negative for leukocytosis or signs anemia, CMP negative for electrolyte abnormalities, no metabolic acidosis, no AKI, no anion gap noted.  Patient's acetaminophen level is less than 10, salicylate level less than 7, ethanol elevated at 29.  Rapid drug screen negative. respiratory panel pending  I have low suspicion for systemic infection as patient is nontoxic-appearing, vital signs reassuring, no obvious source infection on exam, CBC negative for leukocytosis.  Low suspicion for toxic ingestion as patient denies doing so, lab work did not show any acute findings.  Low suspicion for cardiac abnormality as patient denies chest pain, shortness of breath, no signs of hypoperfusion or fluid overload noted on exam.  Low suspicion for acute abdomen requiring surgical intervention as patient denies abdominal pain, tolerating p.o., no peritoneal signs on exam.  Low suspicion for URI as patient denies general body aches, nasal congestion,  sore throat, cough, lung sounds were clear bilaterally.  Will await TTS recommendations and place patient in a psych hold, order home meds.  He is currently not under IVC as I do not anticipate he will have to be, as he states he will stay here voluntarily.     Final Clinical Impression(s) / ED Diagnoses Final diagnoses:  Auditory hallucinations    Rx / DC Orders ED Discharge Orders    None       Barnie Del 09/15/20 2334    Milagros Loll, MD 09/16/20 [phone]

## 2020-09-15 NOTE — ED Triage Notes (Addendum)
Pt reports auditory hallucinations with the voices saying they are going to kill him. Requesting access to [facility].

## 2020-09-15 NOTE — ED Notes (Signed)
Patient's belonging were put in the locker. In his belongings were a pack of cigarettes, a lighter, and $5.37.

## 2020-09-15 NOTE — BH Assessment (Addendum)
Comprehensive Clinical Assessment (CCA) Screening, Triage and Referral Note  09/15/2020 Cole Barrett [id number]   Cole Barrett is a 34 year old male presenting to [facility] due to auditory hallucinations. Patient denied SI, HI and visual hallucinations. Patient reported onset of auditory hallucinations was 2 months ago. Patient reported no command voices and that voices are saying "there saying kill me". Patient reported hearing voices "all the time" and that its getting worse. Patient denied depressive symptoms. Patient denied any recent traumatic events in his life. Patient reported wanting to go to [facility], which he was there in 08/2019 where he was held for overnight observation due to hallucinations. Patient reported 2-3 hours sleep and normal appetite.  Patient reported taking Risperdal for hallucinations. Patient unable to tell whom prescribed his medications. Patient denied receiving any outpatient mental health services at this time. Patient currently resides alone. Patient is not employed. Patient denied having a support system, along with collateral contact. Patient denied access to guns. Patient was cooperative during assessment.   Disposition [name], NP, psych cleared. Patient given outpatient resources for medication management for follow up with psychiatrist for his psych needs.   Visit Diagnosis:    ICD-10-CM   1. Auditory hallucinations  R44.0     Patient Reported Information How did you hear about [place]? Self   Referral name: No data recorded  Referral phone number: No data recorded Whom do you see for routine medical problems? I don't have a doctor   Practice/Facility Name: No data recorded  Practice/Facility Phone Number: No data recorded  Name of Contact: No data recorded  Contact Number: No data recorded  Contact Fax Number: No data recorded  Prescriber Name: No data recorded  Prescriber Address (if known): No data recorded What Is the Reason for  Your Visit/Call Today? "hearing voices"  How Long Has This Been Causing You Problems? 1-6 months  Have You Recently Been in Any Inpatient Treatment (Hospital/Detox/Crisis Center/28-Day Program)? No   Name/Location of Program/Hospital:No data recorded  How Long Were You There? No data recorded  When Were You Discharged? No data recorded Have You Ever Received Services From [facility] Before? No   Who Do You See at [facility]? No data recorded Have You Recently Had Any Thoughts About Hurting Yourself? No   Are You Planning to Commit Suicide/Harm Yourself At This time?  No  Have you Recently Had Thoughts About Hurting Someone [name]? No   Explanation: No data recorded Have You Used Any Alcohol or Drugs in the Past 24 Hours? No   How Long Ago Did You Use Drugs or Alcohol?  No data recorded  What Did You Use and How Much? No data recorded What Do You Feel Would Help You the Most Today? Medication;Therapy  Do You Currently Have a Therapist/Psychiatrist? No   Name of Therapist/Psychiatrist: No data recorded  Have You Been Recently Discharged From Any Office Practice or Programs? No   Explanation of Discharge From Practice/Program:  No data recorded    CCA Screening Triage Referral Assessment Type of Contact: Tele-Assessment   Is this Initial or Reassessment? Initial Assessment   Date Telepsych consult ordered in [facility]:  09/15/20   Time Telepsych consult ordered in [facility]:  2126  Patient Reported Information Reviewed? Yes   Patient Left Without Being Seen? No data recorded  Reason for Not Completing Assessment: No data recorded Collateral Involvement: none reported  Does Patient Have a Court Appointed Legal Guardian? No data recorded  Name and Contact  of Legal Guardian:  Self.   If Minor and Not Living with Parent(s), Who has Custody? N/A  Is CPS involved or ever been involved? Never  Is APS involved or ever been involved? Never  Patient Determined To Be At Risk for Harm  To Self or Others Based on Review of Patient Reported Information or Presenting Complaint? No   Method: No data recorded  Availability of Means: No data recorded  Intent: No data recorded  Notification Required: No data recorded  Additional Information for Danger to Others Potential:  No data recorded  Additional Comments for Danger to Others Potential:  No data recorded  Are There Guns or Other Weapons in Your Home?  No data recorded   Types of Guns/Weapons: No data recorded   Are These Weapons Safely Secured?                              No data recorded   Who Could Verify You Are Able To Have These Secured:    No data recorded Do You Have any Outstanding Charges, Pending Court Dates, Parole/Probation? No data recorded Contacted To Inform of Risk of Harm To Self or Others: Other: Comment (N/A)  Location of Assessment: [facility] ED  Does Patient Present under Involuntary Commitment? No   IVC Papers Initial File Date: No data recorded  [place] of Residence: [name]  Patient Currently Receiving the Following Services: Not Receiving Services   Determination of Need: Routine (7 days)   Options For Referral: Medication Management   Burnetta Sabin, [facility]

## 2020-09-16 NOTE — ED Notes (Signed)
Pt discharged home. Discharged instructions read to pt who verbalized understanding. All belongings returned to pt. Denies SI/HI, is not delusional and not responding to internal stimuli. Escorted pt to the ED exit.   

## 2020-09-16 NOTE — BH Assessment (Signed)
[name], NP, psych cleared. Patient given outpatient resources for medication management for follow up with psychiatrist for his psych needs.

## 2020-09-16 NOTE — ED Notes (Signed)
Status turned green by Dr. Rubin Payor after pt cleared by [name] NP for psych with outpatient resources.  Pt verbalizes readiness to leave with a bus pass. Denies SI/HI/AVH. Not responding.  Belongings returned to pt.

## 2020-09-26 ENCOUNTER — Emergency Department (HOSPITAL_COMMUNITY)
Admission: EM | Admit: 2020-09-26 | Discharge: 2020-09-26 | Disposition: A | Discharge status: Home / Self Care | Payer: Self-pay | Attending: Emergency Medicine | Admitting: Emergency Medicine

## 2020-09-26 ENCOUNTER — Other Ambulatory Visit: Payer: Self-pay

## 2020-09-26 ENCOUNTER — Encounter (HOSPITAL_COMMUNITY): Payer: Self-pay

## 2020-09-26 DIAGNOSIS — Z760 Encounter for issue of repeat prescription: Secondary | ICD-10-CM | POA: Insufficient documentation

## 2020-09-26 DIAGNOSIS — F17210 Nicotine dependence, cigarettes, uncomplicated: Secondary | ICD-10-CM | POA: Insufficient documentation

## 2020-09-26 MED ORDER — RISPERIDONE 1 MG PO TABS
ORAL_TABLET | ORAL | 0 refills | Status: DC
Start: 2020-09-26 — End: 2020-09-30

## 2020-09-26 MED ORDER — RISPERIDONE 1 MG PO TABS
1.0000 mg | ORAL_TABLET | Freq: Once | ORAL | Status: AC
  Administered 2020-09-26: 1 mg via ORAL
  Filled 2020-09-26: qty 1

## 2020-09-26 NOTE — ED Triage Notes (Signed)
Pt requesting refill on Risperdal.

## 2020-09-26 NOTE — ED Provider Notes (Signed)
[facility]-EMERGENCY DEPT Provider Note   CSN: [id number] Arrival date & time: 09/26/20  0516     History Chief Complaint  Patient presents with  . Medication Refill    Cole Barrett is a 33 y.o. male.  Patient presents to the emergency department with a chief complaint of having run out of his Risperdal.  He is requesting medication refill.  He states that he is having hallucinations.  He denies SI or HI.  Denies any other complaints at this time.  States that he typically follows up with [facility].  The history is provided by the patient. No language interpreter was used.       Past Medical History:  Diagnosis Date  . Homelessness   . Hypercholesterolemia   . Mental disorder   . Paranoid behavior (HCC)   . PE (pulmonary embolism)   . Schizophrenia [facility])     Patient Active Problem List   Diagnosis Date Noted  . Acute psychosis (HCC) 08/31/2019  . Schizophrenia (HCC) 08/31/2019  . Schizoaffective disorder, bipolar type (HCC) 01/19/2019  . Alcohol abuse   . Auditory hallucinations   . Alcohol abuse with alcohol-induced mood disorder (HCC) 09/08/2016  . Homelessness 09/03/2016  . Person feigning illness 09/03/2016  . 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 Cannabis use disorder, mild, abuse 08/29/2016  . Pulmonary emboli (HCC) 07/13/2016  . Adjustment disorder with emotional disturbance 03/12/2014  . 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 Acute left flank pain 01/02/2014  . Pulmonary embolism, bilateral (HCC) 01/02/2014    History reviewed. No pertinent surgical history.     Family History  Problem Relation Age of Onset  . Hypertension Mother     Social History   Tobacco Use  . Smoking status: Current Every Day Smoker    Packs/day: 0.50    Types: Cigarettes  . Smokeless tobacco: Never Used  Vaping Use  . Vaping Use: Never used  Substance Use Topics  . Alcohol use: Yes    Comment: BAC not available   . Drug use: Yes    Types: Marijuana    Comment: Last used: 2 months ago UDS NA    Home Medications Prior to Admission medications   Medication Sig Start Date End Date Taking? Authorizing Provider  OLANZapine (ZYPREXA) 2.5 MG tablet Take 1 tablet (2.5 mg total) by mouth at bedtime. 06/15/20   Mesner, Barbara Cower, MD  risperiDONE (RISPERDAL) 1 MG tablet Take 1 tablet by mouth each morning.  Take 3 tablets by mouth at bedtime. 09/26/20   Roxy Horseman, PA-C  amantadine (SYMMETREL) 100 MG capsule Take 1 capsule (100 mg total) by mouth 2 (two) times daily. Patient not taking: Reported on 11/04/2019 09/01/19 11/17/19  [name], NP    Allergies    Haldol [haloperidol]  Review of Systems   Review of Systems  Constitutional: Negative for diaphoresis and fever.  Psychiatric/Behavioral: Positive for hallucinations. Negative for self-injury and suicidal ideas.    Physical Exam Updated Vital Signs BP (!) 146/91 (BP Location: Left Arm)   Pulse 79   Temp 97.7 F (36.5 C) (Oral)   Resp 18   Ht 5\' 10"  (1.778 m)   Wt 99.8 kg   SpO2 97%   BMI 31.57 kg/m   Physical Exam Vitals and nursing note reviewed.  Constitutional:      General: He is not in acute distress.    Appearance: He is well-developed. He is not ill-appearing.  HENT:  Head: Normocephalic and atraumatic.  Eyes:     Conjunctiva/sclera: Conjunctivae normal.  Cardiovascular:     Rate and Rhythm: Normal rate.  Pulmonary:     Effort: Pulmonary effort is normal. No respiratory distress.  Abdominal:     General: There is no distension.  Musculoskeletal:     Cervical back: Neck supple.     Comments: Moves all extremities  Skin:    General: Skin is warm and dry.  Neurological:     Mental Status: He is alert and oriented to person, place, and time.  Psychiatric:        Mood and Affect: Mood normal.        Behavior: Behavior normal.     ED Results / Procedures / Treatments   Labs (all labs ordered are listed, but  only abnormal results are displayed) Labs Reviewed - No data to display  EKG None  Radiology No results found.  Procedures Procedures (including critical care time)  Medications Ordered in ED Medications - No data to display  ED Course  I have reviewed the triage vital signs and the nursing notes.  Pertinent labs & imaging results that were available during my care of the patient were reviewed by me and considered in my medical decision making (see chart for details).    MDM Rules/Calculators/A&P                          No SI or HI.  Will give enough risperdal to get him through the weekend.  F/u with [facility]. Final Clinical Impression(s) / ED Diagnoses Final diagnoses:  Medication refill    Rx / DC Orders ED Discharge Orders         Ordered    risperiDONE (RISPERDAL) 1 MG tablet        09/26/20 0538           Roxy Horseman, PA-C 09/26/20 0542    Gilda Crease, MD 09/26/20 [phone]

## 2020-09-26 NOTE — Discharge Instructions (Signed)
As a courtesy, we have refilled enough risperdal to get you through the weekend.  Please go to [facility] or contact your doctor on Monday to get a regular refill.

## 2020-09-30 ENCOUNTER — Encounter (HOSPITAL_COMMUNITY): Payer: Self-pay | Admitting: Emergency Medicine

## 2020-09-30 ENCOUNTER — Emergency Department (HOSPITAL_COMMUNITY)
Admission: EM | Admit: 2020-09-30 | Discharge: 2020-09-30 | Disposition: A | Discharge status: Home / Self Care | Payer: Self-pay | Attending: Emergency Medicine | Admitting: Emergency Medicine

## 2020-09-30 ENCOUNTER — Other Ambulatory Visit: Payer: Self-pay

## 2020-09-30 DIAGNOSIS — Z79899 Other long term (current) drug therapy: Secondary | ICD-10-CM | POA: Insufficient documentation

## 2020-09-30 DIAGNOSIS — F200 Paranoid schizophrenia: Secondary | ICD-10-CM | POA: Insufficient documentation

## 2020-09-30 DIAGNOSIS — Z5900 Homelessness unspecified: Secondary | ICD-10-CM | POA: Insufficient documentation

## 2020-09-30 DIAGNOSIS — Z760 Encounter for issue of repeat prescription: Secondary | ICD-10-CM | POA: Insufficient documentation

## 2020-09-30 DIAGNOSIS — F17210 Nicotine dependence, cigarettes, uncomplicated: Secondary | ICD-10-CM | POA: Insufficient documentation

## 2020-09-30 LAB — ACETAMINOPHEN LEVEL: Acetaminophen (Tylenol), Serum: <10 ug/mL — ABNORMAL LOW (ref 10–30)

## 2020-09-30 LAB — COMPREHENSIVE METABOLIC PANEL
ALT: 50 U/L — ABNORMAL HIGH (ref 0–44)
AST: 33 U/L (ref 15–41)
Albumin: 4.1 g/dL (ref 3.5–5.0)
Alkaline Phosphatase: 57 U/L (ref 38–126)
Anion gap: 11 (ref 5–15)
BUN: 12 mg/dL (ref 6–20)
CO2: 24 mmol/L (ref 22–32)
Calcium: 9.1 mg/dL (ref 8.9–10.3)
Chloride: 104 mmol/L (ref 98–111)
Creatinine, Ser: 1.03 mg/dL (ref 0.61–1.24)
GFR, Estimated: >60 mL/min (ref >60)
Glucose, Bld: 94 mg/dL (ref 70–99)
Potassium: 3.8 mmol/L (ref 3.5–5.1)
Sodium: 139 mmol/L (ref 135–145)
Total Bilirubin: 0.5 mg/dL (ref 0.3–1.2)
Total Protein: 7.4 g/dL (ref 6.5–8.1)

## 2020-09-30 LAB — CBC
HCT: 42.4 % (ref 39.0–52.0)
Hemoglobin: 13.4 g/dL (ref 13.0–17.0)
MCH: 27.5 pg (ref 26.0–34.0)
MCHC: 31.6 g/dL (ref 30.0–36.0)
MCV: 86.9 fL (ref 80.0–100.0)
Platelets: 307 10*3/uL (ref 150–400)
RBC: 4.88 MIL/uL (ref 4.22–5.81)
RDW: 12.5 % (ref 11.5–15.5)
WBC: 5.2 10*3/uL (ref 4.0–10.5)
nRBC: 0 % (ref 0.0–0.2)

## 2020-09-30 LAB — SALICYLATE LEVEL: Salicylate Lvl: <7 mg/dL — ABNORMAL LOW (ref 7.0–30.0)

## 2020-09-30 LAB — RAPID URINE DRUG SCREEN, HOSP PERFORMED
Amphetamines: NOT DETECTED
Barbiturates: NOT DETECTED
Benzodiazepines: NOT DETECTED
Cocaine: NOT DETECTED
Opiates: NOT DETECTED
Tetrahydrocannabinol: NOT DETECTED

## 2020-09-30 LAB — ETHANOL: Alcohol, Ethyl (B): 70 mg/dL — ABNORMAL HIGH (ref <10)

## 2020-09-30 MED ORDER — OLANZAPINE 5 MG PO TABS
2.5000 mg | ORAL_TABLET | ORAL | Status: AC
  Administered 2020-09-30: 2.5 mg via ORAL
  Filled 2020-09-30: qty 1

## 2020-09-30 MED ORDER — OLANZAPINE 2.5 MG PO TABS: 2.5000 mg | ORAL_TABLET | Freq: Every day | ORAL | 0 refills | Status: DC

## 2020-09-30 MED ORDER — RISPERIDONE 1 MG PO TABS: ORAL_TABLET | ORAL | 0 refills | Status: DC

## 2020-09-30 MED ORDER — RISPERIDONE 1 MG PO TABS
1.0000 mg | ORAL_TABLET | ORAL | Status: AC
  Administered 2020-09-30: 1 mg via ORAL
  Filled 2020-09-30: qty 1

## 2020-09-30 NOTE — Discharge Instructions (Signed)
Please fill your medications and follow up with your psychiatrist.

## 2020-09-30 NOTE — ED Triage Notes (Signed)
Patient has not had his Risperdal today and he states that he is hearing voices.  Patient denies any SI/HI.

## 2020-09-30 NOTE — ED Provider Notes (Addendum)
[name] EMERGENCY DEPARTMENT Provider Note   CSN: [id number] Arrival date & time: 09/30/20  0305     History Chief Complaint  Patient presents with  . Medical Clearance    Cole Barrett is a 34 y.o. male.  HPI Patient is a 34 year old male with past medical history of homelessness and paranoid behavior and schizophrenia on olanzapine and risperidone. He has not taken his medications in several months he states. He is coming here today for his medications. He denies any other complaints. He is requesting food. Denies any SI, HI, he has stable auditory hallucinations. He states that he is not feel that he is in danger for himself and else.  Denies any other associated symptoms. Denies any aggravating or mitigating factors.     Past Medical History:  Diagnosis Date  . Homelessness   . Hypercholesterolemia   . Mental disorder   . Paranoid behavior (HCC)   . PE (pulmonary embolism)   . Schizophrenia [facility])     Patient Active Problem List   Diagnosis Date Noted  . Acute psychosis (HCC) 08/31/2019  . Schizophrenia (HCC) 08/31/2019  . Schizoaffective disorder, bipolar type (HCC) 01/19/2019  . Alcohol abuse   . Auditory hallucinations   . Alcohol abuse with alcohol-induced mood disorder (HCC) 09/08/2016  . Homelessness 09/03/2016  . Person feigning illness 09/03/2016  . 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 Cannabis use disorder, mild, abuse 08/29/2016  . Pulmonary emboli (HCC) 07/13/2016  . Adjustment disorder with emotional disturbance 03/12/2014  . 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 Acute left flank pain 01/02/2014  . Pulmonary embolism, bilateral (HCC) 01/02/2014    History reviewed. No pertinent surgical history.     Family History  Problem Relation Age of Onset  . Hypertension Mother     Social History   Tobacco Use  . Smoking status: Current Every Day Smoker    Packs/day: 0.50    Types:  Cigarettes  . Smokeless tobacco: Never Used  Vaping Use  . Vaping Use: Never used  Substance Use Topics  . Alcohol use: Yes    Comment: BAC not available  . Drug use: Yes    Types: Marijuana    Comment: Last used: 2 months ago UDS NA    Home Medications Prior to Admission medications   Medication Sig Start Date End Date Taking? Authorizing Provider  OLANZapine (ZYPREXA) 2.5 MG tablet Take 1 tablet (2.5 mg total) by mouth at bedtime. 09/30/20   Gailen Shelter, PA  risperiDONE (RISPERDAL) 1 MG tablet Take 1 tablet by mouth each morning.  Take 3 tablets by mouth at bedtime. 09/30/20   Gailen Shelter, PA  amantadine (SYMMETREL) 100 MG capsule Take 1 capsule (100 mg total) by mouth 2 (two) times daily. Patient not taking: Reported on 11/04/2019 09/01/19 11/17/19  [name], NP    Allergies    Haldol [haloperidol]  Review of Systems   Review of Systems  Constitutional: Negative for chills and fever.  HENT: Negative for congestion.   Respiratory: Negative for shortness of breath.   Cardiovascular: Negative for chest pain.  Gastrointestinal: Negative for abdominal pain.  Musculoskeletal: Negative for neck pain.  Psychiatric/Behavioral: Positive for hallucinations.    Physical Exam Updated Vital Signs BP 120/84 (BP Location: Left Arm)   Pulse 64   Temp 98.1 F (36.7 C) (Oral)   Resp 16   SpO2 96%   Physical Exam Vitals and nursing note reviewed.  Constitutional:  General: He is not in acute distress.    Appearance: Normal appearance. He is not ill-appearing.  HENT:     Head: Normocephalic and atraumatic.  Eyes:     General: No scleral icterus.       Right eye: No discharge.        Left eye: No discharge.     Conjunctiva/sclera: Conjunctivae normal.  Pulmonary:     Effort: Pulmonary effort is normal.     Breath sounds: No stridor.  Neurological:     Mental Status: He is alert and oriented to person, place, and time. Mental status is at baseline.    Psychiatric:     Comments: Pleasant, able answer questions appropriately follow commands. Somewhat bizarre affect.     ED Results / Procedures / Treatments   Labs (all labs ordered are listed, but only abnormal results are displayed) Labs Reviewed  COMPREHENSIVE METABOLIC PANEL - Abnormal; Notable for the following components:      Result Value   ALT 50 (*)    All other components within normal limits  ETHANOL - Abnormal; Notable for the following components:   Alcohol, Ethyl (B) 70 (*)    All other components within normal limits  ACETAMINOPHEN LEVEL - Abnormal; Notable for the following components:   Acetaminophen (Tylenol), Serum <10 (*)    All other components within normal limits  SALICYLATE LEVEL - Abnormal; Notable for the following components:   Salicylate Lvl <7.0 (*)    All other components within normal limits  CBC  RAPID URINE DRUG SCREEN, HOSP PERFORMED    EKG None  Radiology No results found.  Procedures Procedures (including critical care time)  Medications Ordered in ED Medications  OLANZapine (ZYPREXA) tablet 2.5 mg (has no administration in time range)  risperiDONE (RISPERDAL) tablet 1 mg (has no administration in time range)    ED Course  I have reviewed the triage vital signs and the nursing notes.  Pertinent labs & imaging results that were available during my care of the patient were reviewed by me and considered in my medical decision making (see chart for details).  Clinical Course as of Sep 30 712  Wed Sep 30, 2020  0712 Delay, Tylenol within normal limits/undetectable  Salicylate level(!) [WF]  7893 Ethanol level 70.   [WF]  8101 CBC values suggestive of anemia. CMP without any significant electrolyte abnormalities.   [WF]    Clinical Course User Index [WF] Gailen Shelter, [place]   MDM Rules/Calculators/A&P                          Patient with history of schizophrenia presented today for his daily medications. He has been  nauseous for at least a month.  We read of 1 dose today discharge home medications. Will follow with his psychiatrist.  Discussed with my attending physician who is agreeable with plan.  Final Clinical Impression(s) / ED Diagnoses Final diagnoses:  Medication refill    Rx / DC Orders ED Discharge Orders         Ordered    OLANZapine (ZYPREXA) 2.5 MG tablet  Daily at bedtime        09/30/20 0708    risperiDONE (RISPERDAL) 1 MG tablet        09/30/20 0708           Gailen Shelter, PA 09/30/20 0711    Gailen Shelter, PA 09/30/20 7510    [name], MD  10/01/20 0731  

## 2020-09-30 NOTE — ED Notes (Signed)
Patient verbalizes understanding of discharge instructions. Opportunity for questioning and answers were provided. Arm band removed by staff, patient discharged from ED. 

## 2020-10-04 ENCOUNTER — Encounter (HOSPITAL_COMMUNITY): Payer: Self-pay

## 2020-10-04 ENCOUNTER — Emergency Department (HOSPITAL_COMMUNITY)
Admission: EM | Admit: 2020-10-04 | Discharge: 2020-10-05 | Disposition: A | Discharge status: Home / Self Care | Payer: Self-pay | Attending: Emergency Medicine | Admitting: Emergency Medicine

## 2020-10-04 DIAGNOSIS — R443 Hallucinations, unspecified: Secondary | ICD-10-CM | POA: Insufficient documentation

## 2020-10-04 DIAGNOSIS — F17210 Nicotine dependence, cigarettes, uncomplicated: Secondary | ICD-10-CM | POA: Insufficient documentation

## 2020-10-04 NOTE — ED Triage Notes (Signed)
Pt ran out of his risperdal 3 days ago and is hearing voices, denies SI

## 2020-10-05 MED ORDER — RISPERIDONE 1 MG PO TABS
1.0000 mg | ORAL_TABLET | Freq: Once | ORAL | Status: AC
  Administered 2020-10-05: 1 mg via ORAL
  Filled 2020-10-05: qty 1

## 2020-10-05 NOTE — ED Provider Notes (Signed)
[facility] EMERGENCY DEPARTMENT Provider Note   CSN: [id number] Arrival date & time: 10/04/20  2234     History Chief Complaint  Patient presents with  . Hallucinations    Cole Barrett is a 34 y.o. male.  The history is provided by the patient and medical records.    34 year old male with history of paranoid schizophrenia, homelessness, presenting to the ED with hallucinations. This is a chronic problem for him, especially when he has been off his risperidone. States he has not had his medications in about 3 days. States it is at the pharmacy waiting for pickup but "I had things to do". He plans to pick it up today but is wanting a dose tonight to stop the hallucinations. He denies any suicidal homicidal ideation.  Past Medical History:  Diagnosis Date  . Homelessness   . Hypercholesterolemia   . Mental disorder   . Paranoid behavior (HCC)   . PE (pulmonary embolism)   . Schizophrenia [facility])     Patient Active Problem List   Diagnosis Date Noted  . Acute psychosis (HCC) 08/31/2019  . Schizophrenia (HCC) 08/31/2019  . Schizoaffective disorder, bipolar type (HCC) 01/19/2019  . Alcohol abuse   . Auditory hallucinations   . Alcohol abuse with alcohol-induced mood disorder (HCC) 09/08/2016  . Homelessness 09/03/2016  . Person feigning illness 09/03/2016  . 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 Cannabis use disorder, mild, abuse 08/29/2016  . Pulmonary emboli (HCC) 07/13/2016  . Adjustment disorder with emotional disturbance 03/12/2014  . 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 Acute left flank pain 01/02/2014  . Pulmonary embolism, bilateral (HCC) 01/02/2014    History reviewed. No pertinent surgical history.     Family History  Problem Relation Age of Onset  . Hypertension Mother     Social History   Tobacco Use  . Smoking status: Current Every Day Smoker    Packs/day: 0.50    Types: Cigarettes  .  Smokeless tobacco: Never Used  Vaping Use  . Vaping Use: Never used  Substance Use Topics  . Alcohol use: Yes    Comment: BAC not available  . Drug use: Yes    Types: Marijuana    Comment: Last used: 2 months ago UDS NA    Home Medications Prior to Admission medications   Medication Sig Start Date End Date Taking? Authorizing Provider  OLANZapine (ZYPREXA) 2.5 MG tablet Take 1 tablet (2.5 mg total) by mouth at bedtime. 09/30/20   Gailen Shelter, PA  risperiDONE (RISPERDAL) 1 MG tablet Take 1 tablet by mouth each morning.  Take 3 tablets by mouth at bedtime. 09/30/20   Gailen Shelter, PA  amantadine (SYMMETREL) 100 MG capsule Take 1 capsule (100 mg total) by mouth 2 (two) times daily. Patient not taking: Reported on 11/04/2019 09/01/19 11/17/19  [name], NP    Allergies    Haldol [haloperidol]  Review of Systems   Review of Systems  Psychiatric/Behavioral: Positive for hallucinations.  All other systems reviewed and are negative.   Physical Exam Updated Vital Signs BP (!) 142/69   Pulse 89   Temp 97.8 F (36.6 C) (Oral)   Resp 18   SpO2 98%   Physical Exam Vitals and nursing note reviewed.  Constitutional:      Appearance: He is well-developed.  HENT:     Head: Normocephalic and atraumatic.  Eyes:     Conjunctiva/sclera: Conjunctivae normal.     Pupils: Pupils are  equal, round, and reactive to light.  Cardiovascular:     Rate and Rhythm: Normal rate and regular rhythm.     Heart sounds: Normal heart sounds.  Pulmonary:     Effort: Pulmonary effort is normal.     Breath sounds: Normal breath sounds.  Abdominal:     General: Bowel sounds are normal.     Palpations: Abdomen is soft.  Musculoskeletal:        General: Normal range of motion.     Cervical back: Normal range of motion.  Skin:    General: Skin is warm and dry.  Neurological:     Mental Status: He is alert and oriented to person, place, and time.  Psychiatric:     Comments: Denies SI/HI       ED Results / Procedures / Treatments   Labs (all labs ordered are listed, but only abnormal results are displayed) Labs Reviewed - No data to display  EKG None  Radiology No results found.  Procedures Procedures (including critical care time)  Medications Ordered in ED Medications  risperiDONE (RISPERDAL) tablet 1 mg (1 mg Oral Given 10/05/20 0446)    ED Course  I have reviewed the triage vital signs and the nursing notes.  Pertinent labs & imaging results that were available during my care of the patient were reviewed by me and considered in my medical decision making (see chart for details).    MDM Rules/Calculators/A&P  35 year old male here with hallucinations. This is a chronic problem. He stopped taking his Risperdal 3 days ago when his prescription ran out. States he has a refill at the pharmacy but has not picked it up yet because "I had things to do". Denies any suicidal homicidal ideation. Given dose of his Risperdal here, encouraged to pick up his refill at the pharmacy.  Can follow-up with [facility] if any ongoing issues.  Return here for any new/acute changes.  Final Clinical Impression(s) / ED Diagnoses Final diagnoses:  Hallucinations    Rx / DC Orders ED Discharge Orders    None       Garlon Hatchet, PA-C 10/05/20 0452    Sabino Donovan, MD 10/05/20 [phone]

## 2020-10-05 NOTE — Discharge Instructions (Signed)
Make sure to pick up your risperdal from the pharmacy.  Try to avoid skipping doses of medication.

## 2020-10-05 NOTE — ED Notes (Signed)
States he has been out of his medication for several days , states he was going to [facility] for refill.

## 2020-10-07 ENCOUNTER — Emergency Department (HOSPITAL_COMMUNITY)
Admission: EM | Admit: 2020-10-07 | Discharge: 2020-10-07 | Disposition: A | Discharge status: Home / Self Care | Payer: Self-pay | Attending: Emergency Medicine | Admitting: Emergency Medicine

## 2020-10-07 DIAGNOSIS — Z760 Encounter for issue of repeat prescription: Secondary | ICD-10-CM | POA: Insufficient documentation

## 2020-10-07 DIAGNOSIS — Z79899 Other long term (current) drug therapy: Secondary | ICD-10-CM | POA: Insufficient documentation

## 2020-10-07 DIAGNOSIS — F17210 Nicotine dependence, cigarettes, uncomplicated: Secondary | ICD-10-CM | POA: Insufficient documentation

## 2020-10-07 DIAGNOSIS — R443 Hallucinations, unspecified: Secondary | ICD-10-CM | POA: Insufficient documentation

## 2020-10-07 DIAGNOSIS — Z299 Encounter for prophylactic measures, unspecified: Secondary | ICD-10-CM

## 2020-10-07 MED ORDER — RISPERIDONE 1 MG PO TABS
1.0000 mg | ORAL_TABLET | Freq: Once | ORAL | Status: AC
  Administered 2020-10-07: 1 mg via ORAL
  Filled 2020-10-07: qty 1

## 2020-10-07 NOTE — Discharge Instructions (Addendum)
Follow up with your doctor for regular medications.

## 2020-10-07 NOTE — ED Triage Notes (Signed)
Patient arrived to ED with c/o of Hallucinations. Pt  States " I just need to take some medication" Pt was constantly falling asleep during triage and when asked if he needs a medication refill patient states he just needs medication right now. Pt A&Ox 4-Monique,RN

## 2020-10-07 NOTE — ED Provider Notes (Signed)
[name] EMERGENCY DEPARTMENT Provider Note   CSN: [id number] Arrival date & time: 10/07/20  0310     History Chief Complaint  Patient presents with  . Hallucinations    Cole Barrett is a 34 y.o. male.  Patient with history of schizophrenia, paranoid behavior, HLD, homelessness, PE presents for dose of his night time medication which he reports as Risperdal 1 mg. He states he is out of his prescription but plans to get it later today. He does not feel he is mentally unstable. No physical complaints.   The history is provided by the patient. No language interpreter was used.       Past Medical History:  Diagnosis Date  . Homelessness   . Hypercholesterolemia   . Mental disorder   . Paranoid behavior (HCC)   . PE (pulmonary embolism)   . Schizophrenia [facility])     Patient Active Problem List   Diagnosis Date Noted  . Acute psychosis (HCC) 08/31/2019  . Schizophrenia (HCC) 08/31/2019  . Schizoaffective disorder, bipolar type (HCC) 01/19/2019  . Alcohol abuse   . Auditory hallucinations   . Alcohol abuse with alcohol-induced mood disorder (HCC) 09/08/2016  . Homelessness 09/03/2016  . Person feigning illness 09/03/2016  . 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 Cannabis use disorder, mild, abuse 08/29/2016  . Pulmonary emboli (HCC) 07/13/2016  . Adjustment disorder with emotional disturbance 03/12/2014  . 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 Acute left flank pain 01/02/2014  . Pulmonary embolism, bilateral (HCC) 01/02/2014    No past surgical history on file.     Family History  Problem Relation Age of Onset  . Hypertension Mother     Social History   Tobacco Use  . Smoking status: Current Every Day Smoker    Packs/day: 0.50    Types: Cigarettes  . Smokeless tobacco: Never Used  Vaping Use  . Vaping Use: Never used  Substance Use Topics  . Alcohol use: Yes    Comment: BAC not available  . Drug  use: Yes    Types: Marijuana    Comment: Last used: 2 months ago UDS NA    Home Medications Prior to Admission medications   Medication Sig Start Date End Date Taking? Authorizing Provider  risperiDONE (RISPERDAL) 1 MG tablet Take 1 tablet by mouth each morning.  Take 3 tablets by mouth at bedtime. Patient taking differently: Take 1 mg by mouth at bedtime. . 09/30/20  Yes Fondaw, Wylder S, PA  OLANZapine (ZYPREXA) 2.5 MG tablet Take 1 tablet (2.5 mg total) by mouth at bedtime. Patient not taking: Reported on 10/07/2020 09/30/20   Gailen Shelter, PA  amantadine (SYMMETREL) 100 MG capsule Take 1 capsule (100 mg total) by mouth 2 (two) times daily. Patient not taking: Reported on 11/04/2019 09/01/19 11/17/19  [name], NP    Allergies    Haldol [haloperidol]  Review of Systems   Review of Systems  Physical Exam Updated Vital Signs BP 116/62 (BP Location: Left Arm)   Pulse 86   Temp 97.9 F (36.6 C) (Oral)   Resp 16   SpO2 96%   Physical Exam Constitutional:      Appearance: Normal appearance. He is well-developed. He is obese.  HENT:     Head: Atraumatic.  Cardiovascular:     Rate and Rhythm: Normal rate.  Pulmonary:     Effort: Pulmonary effort is normal.  Abdominal:     General: There is no distension.  Musculoskeletal:        General: Normal range of motion.     Cervical back: Normal range of motion.  Skin:    General: Skin is warm and dry.  Neurological:     Mental Status: He is alert and oriented to person, place, and time.  Psychiatric:        Mood and Affect: Mood normal.     ED Results / Procedures / Treatments   Labs (all labs ordered are listed, but only abnormal results are displayed) Labs Reviewed - No data to display  EKG None  Radiology No results found.  Procedures Procedures (including critical care time)  Medications Ordered in ED Medications  risperiDONE (RISPERDAL) tablet 1 mg (has no administration in time range)    ED  Course  I have reviewed the triage vital signs and the nursing notes.  Pertinent labs & imaging results that were available during my care of the patient were reviewed by me and considered in my medical decision making (see chart for details).    MDM Rules/Calculators/A&P                          Patient well known to the ED with frequent presentations for medication dosing due to being out of his prescription.   He appears calm, polite, cooperative. He is sleepy but wakes easily and is mentally clear. VSS.   Will provide single dose Risperdal, Strongly encouraged to fill prescription as planned.   Final Clinical Impression(s) / ED Diagnoses Final diagnoses:  None   1. Medication need   Rx / DC Orders ED Discharge Orders    None       Elpidio Anis, PA-C 10/07/20 0547    [name], MD 10/07/20 [phone]

## 2020-10-12 ENCOUNTER — Encounter (HOSPITAL_COMMUNITY): Payer: Self-pay

## 2020-10-12 ENCOUNTER — Emergency Department (HOSPITAL_COMMUNITY)
Admission: EM | Admit: 2020-10-12 | Discharge: 2020-10-13 | Disposition: A | Discharge status: Home / Self Care | Payer: Self-pay | Attending: Emergency Medicine | Admitting: Emergency Medicine

## 2020-10-12 DIAGNOSIS — F17210 Nicotine dependence, cigarettes, uncomplicated: Secondary | ICD-10-CM | POA: Insufficient documentation

## 2020-10-12 DIAGNOSIS — R440 Auditory hallucinations: Secondary | ICD-10-CM | POA: Insufficient documentation

## 2020-10-12 DIAGNOSIS — Z5900 Homelessness unspecified: Secondary | ICD-10-CM | POA: Insufficient documentation

## 2020-10-12 DIAGNOSIS — Z79899 Other long term (current) drug therapy: Secondary | ICD-10-CM | POA: Insufficient documentation

## 2020-10-12 NOTE — ED Triage Notes (Signed)
Patient arrived stating he is hearing voices, reports needing his medication. Declines SI or HI

## 2020-10-13 NOTE — Discharge Instructions (Addendum)
Look at the information to try to find a shelter to stay when it is cold.  Please keep your appointments at [facility] and continue on your medication.

## 2020-10-13 NOTE — ED Provider Notes (Signed)
[facility]-EMERGENCY DEPT Provider Note   CSN: [id number] Arrival date & time: 10/12/20  2326   Time seen 1:05 AM  History No chief complaint on file.   Cole Barrett is a 35 y.o. male.  HPI   Patient is sleeping, he keeps falling asleep while talking to me.  When I ask him why he is here he states "I am here to relax".  He states he is here to make everything all right, he keeps talking about the situation but cannot tell me what the situation is going on that he wants to get handled.  He states he feels all right and then he states he needs to get into behavioral health to be watched to make sure everything is straight.  He then states "you never know what the situation will be about".  He denies feeling depressed, he denies having suicidal or homicidal ideation.  He does state he has hearing voices for a couple of years and states they are not worse today.  He states he is taking his medication and he has not run out.  He states he gets them from [place] and he does not know when his next appointment is.  PCP Patient, No Pcp Per   Past Medical History:  Diagnosis Date  . Homelessness   . Hypercholesterolemia   . Mental disorder   . Paranoid behavior (HCC)   . PE (pulmonary embolism)   . Schizophrenia [facility])     Patient Active Problem List   Diagnosis Date Noted  . Acute psychosis (HCC) 08/31/2019  . Schizophrenia (HCC) 08/31/2019  . Schizoaffective disorder, bipolar type (HCC) 01/19/2019  . Alcohol abuse   . Auditory hallucinations   . Alcohol abuse with alcohol-induced mood disorder (HCC) 09/08/2016  . Homelessness 09/03/2016  . Person feigning illness 09/03/2016  . 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 Cannabis use disorder, mild, abuse 08/29/2016  . Pulmonary emboli (HCC) 07/13/2016  . Adjustment disorder with emotional disturbance 03/12/2014  . 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 Acute left flank pain  01/02/2014  . Pulmonary embolism, bilateral (HCC) 01/02/2014    History reviewed. No pertinent surgical history.     Family History  Problem Relation Age of Onset  . Hypertension Mother     Social History   Tobacco Use  . Smoking status: Current Every Day Smoker    Packs/day: 0.50    Types: Cigarettes  . Smokeless tobacco: Never Used  Vaping Use  . Vaping Use: Never used  Substance Use Topics  . Alcohol use: Yes    Comment: BAC not available  . Drug use: Yes    Types: Marijuana    Comment: Last used: 2 months ago UDS NA  Homeless  Home Medications Prior to Admission medications   Medication Sig Start Date End Date Taking? Authorizing Provider  OLANZapine (ZYPREXA) 2.5 MG tablet Take 1 tablet (2.5 mg total) by mouth at bedtime. 09/30/20  Yes Fondaw, Wylder S, PA  risperiDONE (RISPERDAL) 1 MG tablet Take 1 tablet by mouth each morning.  Take 3 tablets by mouth at bedtime. Patient taking differently: Take 1 mg by mouth at bedtime. . 09/30/20  Yes Fondaw, Wylder S, PA  amantadine (SYMMETREL) 100 MG capsule Take 1 capsule (100 mg total) by mouth 2 (two) times daily. Patient not taking: Reported on 11/04/2019 09/01/19 11/17/19  [name], NP    Allergies    Haldol [haloperidol]  Review of Systems   Review  of Systems  All other systems reviewed and are negative.   Physical Exam Updated Vital Signs BP (!) 146/89   Pulse 63   Temp (!) 97.5 F (36.4 C) (Oral)   Resp 16   Ht 5\' 10"  (1.778 m)   Wt 102.1 kg   SpO2 94%   BMI 32.28 kg/m   Physical Exam Vitals and nursing note reviewed.  Constitutional:      Appearance: He is obese.     Comments: Patient is sleeping soundly, he falls asleep while talking to me in mid sentence.  HENT:     Right Ear: External ear normal.     Left Ear: External ear normal.  Eyes:     Extraocular Movements: Extraocular movements intact.     Pupils: Pupils are equal, round, and reactive to light.     Comments: Patient sclera are  injected bilaterally, he denies smoking marijuana.  Cardiovascular:     Rate and Rhythm: Normal rate and regular rhythm.     Pulses: Normal pulses.     Heart sounds: Normal heart sounds.  Pulmonary:     Effort: Pulmonary effort is normal.     Breath sounds: Normal breath sounds.  Abdominal:     Palpations: Abdomen is soft.     Tenderness: There is no abdominal tenderness.  Musculoskeletal:        General: Normal range of motion.     Cervical back: Normal range of motion.  Skin:    General: Skin is warm and dry.  Neurological:     General: No focal deficit present.     Mental Status: He is alert and oriented to person, place, and time.     Cranial Nerves: No cranial nerve deficit.  Psychiatric:        Mood and Affect: Mood normal.        Speech: Speech is delayed.        Behavior: Behavior is cooperative.        Thought Content: Thought content does not include homicidal or suicidal ideation.     ED Results / Procedures / Treatments   Labs (all labs ordered are listed, but only abnormal results are displayed) Labs Reviewed - No data to display  EKG None  Radiology No results found.  Procedures Procedures (including critical care time)  Medications Ordered in ED Medications - No data to display  ED Course  I have reviewed the triage vital signs and the nursing notes.  Pertinent labs & imaging results that were available during my care of the patient were reviewed by me and considered in my medical decision making (see chart for details).    MDM Rules/Calculators/A&P                          Patient does not seem to have a well defined reason for his visit tonight.  He frequently comes to the emergency department for hallucinations.  When I review his emergency department visits which are many in the past year, approximately 45 he has always been discharged from the ED. Although patient is homeless he has a brand-new shirt on with price tag still in place.  To me he  denies running out of his medication.   Final Clinical Impression(s) / ED Diagnoses Final diagnoses:  Homeless    Rx / DC Orders ED Discharge Orders    None     Plan discharge  , MD, Devoria Albe  Devoria Albe, MD 10/13/20 [phone]

## 2020-10-17 ENCOUNTER — Emergency Department (HOSPITAL_COMMUNITY)
Admission: EM | Admit: 2020-10-17 | Discharge: 2020-10-17 | Disposition: A | Discharge status: Other Acute Inpatient Hospital | Payer: Self-pay | Attending: Emergency Medicine | Admitting: Emergency Medicine

## 2020-10-17 ENCOUNTER — Other Ambulatory Visit: Payer: Self-pay

## 2020-10-17 ENCOUNTER — Ambulatory Visit (HOSPITAL_COMMUNITY)
Admission: EM | Admit: 2020-10-17 | Discharge: 2020-10-17 | Disposition: A | Discharge status: Home / Self Care | Payer: No Payment, Other | Attending: Registered Nurse | Admitting: Registered Nurse

## 2020-10-17 ENCOUNTER — Encounter (HOSPITAL_COMMUNITY): Payer: Self-pay | Admitting: *Deleted

## 2020-10-17 ENCOUNTER — Encounter (HOSPITAL_COMMUNITY): Payer: Self-pay | Admitting: Registered Nurse

## 2020-10-17 DIAGNOSIS — F250 Schizoaffective disorder, bipolar type: Secondary | ICD-10-CM

## 2020-10-17 DIAGNOSIS — Z20822 Contact with and (suspected) exposure to covid-19: Secondary | ICD-10-CM | POA: Insufficient documentation

## 2020-10-17 DIAGNOSIS — R440 Auditory hallucinations: Secondary | ICD-10-CM | POA: Diagnosis not present

## 2020-10-17 DIAGNOSIS — R443 Hallucinations, unspecified: Secondary | ICD-10-CM

## 2020-10-17 DIAGNOSIS — F209 Schizophrenia, unspecified: Secondary | ICD-10-CM | POA: Diagnosis present

## 2020-10-17 DIAGNOSIS — F4325 Adjustment disorder with mixed disturbance of emotions and conduct: Secondary | ICD-10-CM | POA: Insufficient documentation

## 2020-10-17 DIAGNOSIS — F17210 Nicotine dependence, cigarettes, uncomplicated: Secondary | ICD-10-CM | POA: Insufficient documentation

## 2020-10-17 LAB — POC SARS CORONAVIRUS 2 AG -  ED: SARS Coronavirus 2 Ag: NEGATIVE

## 2020-10-17 NOTE — ED Provider Notes (Signed)
[facility] EMERGENCY DEPARTMENT Provider Note   CSN: [id number] Arrival date & time: 10/17/20  4709     History Chief Complaint  Patient presents with  . Hallucinations    Cole Barrett is a 34 y.o. male who presents for hallucinations. Who presents with a cc of hallucinations. The patient has a hx of schizophrenia, schizoaffective disorder, chronic auditory hallucinations, homelessness.  He was seen in the emergency department 5 days ago with the same complaint.  The patient is having command hallucinations he states that the hallucinations are telling him to kill himself but he does not feel suicidal or homicidal.  He denies illicit drug use and occasionally uses alcohol.  States that he just wants to talk to somebody about medication adjustments.  He states he is compliant with his medications.  The history is provided by the patient. No language interpreter was used.       Past Medical History:  Diagnosis Date  . Homelessness   . Hypercholesterolemia   . Mental disorder   . Paranoid behavior (HCC)   . PE (pulmonary embolism)   . Schizophrenia [facility])     Patient Active Problem List   Diagnosis Date Noted  . Acute psychosis (HCC) 08/31/2019  . Schizophrenia (HCC) 08/31/2019  . Schizoaffective disorder, bipolar type (HCC) 01/19/2019  . Alcohol abuse   . Auditory hallucinations   . Alcohol abuse with alcohol-induced mood disorder (HCC) 09/08/2016  . Homelessness 09/03/2016  . Person feigning illness 09/03/2016  . 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 Cannabis use disorder, mild, abuse 08/29/2016  . Pulmonary emboli (HCC) 07/13/2016  . Adjustment disorder with emotional disturbance 03/12/2014  . 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 Acute left flank pain 01/02/2014  . Pulmonary embolism, bilateral (HCC) 01/02/2014    History reviewed. No pertinent surgical history.     Family History  Problem Relation Age of  Onset  . Hypertension Mother     Social History   Tobacco Use  . Smoking status: Current Every Day Smoker    Packs/day: 0.50    Types: Cigarettes  . Smokeless tobacco: Never Used  Vaping Use  . Vaping Use: Never used  Substance Use Topics  . Alcohol use: Yes    Comment: BAC not available  . Drug use: Yes    Types: Marijuana    Comment: Last used: 2 months ago UDS NA    Home Medications Prior to Admission medications   Medication Sig Start Date End Date Taking? Authorizing Provider  OLANZapine (ZYPREXA) 2.5 MG tablet Take 1 tablet (2.5 mg total) by mouth at bedtime. 09/30/20   Gailen Shelter, PA  risperiDONE (RISPERDAL) 1 MG tablet Take 1 tablet by mouth each morning.  Take 3 tablets by mouth at bedtime. Patient taking differently: Take 1 mg by mouth at bedtime. . 09/30/20   Gailen Shelter, PA  amantadine (SYMMETREL) 100 MG capsule Take 1 capsule (100 mg total) by mouth 2 (two) times daily. Patient not taking: Reported on 11/04/2019 09/01/19 11/17/19  [name], NP    Allergies    Haldol [haloperidol]  Review of Systems   Review of Systems Ten systems reviewed and are negative for acute change, except as noted in the HPI.   Physical Exam Updated Vital Signs BP 138/86 (BP Location: Left Arm)   Pulse 66   Temp 98.7 F (37.1 C) (Oral)   Resp 16   SpO2 100%   Physical Exam Vitals and nursing  note reviewed.  Constitutional:      General: He is not in acute distress.    Appearance: He is well-developed. He is not diaphoretic.  HENT:     Head: Normocephalic and atraumatic.  Eyes:     General: No scleral icterus.    Conjunctiva/sclera: Conjunctivae normal.  Cardiovascular:     Rate and Rhythm: Normal rate and regular rhythm.     Heart sounds: Normal heart sounds.  Pulmonary:     Effort: Pulmonary effort is normal. No respiratory distress.     Breath sounds: Normal breath sounds.  Abdominal:     Palpations: Abdomen is soft.     Tenderness: There is no  abdominal tenderness.  Musculoskeletal:     Cervical back: Normal range of motion and neck supple.  Skin:    General: Skin is warm and dry.  Neurological:     Mental Status: He is alert.  Psychiatric:        Attention and Perception: He is attentive. He perceives auditory hallucinations.        Mood and Affect: Mood normal.        Speech: Speech normal.        Behavior: Behavior normal. Behavior is not agitated, aggressive or hyperactive. Behavior is cooperative.        Thought Content: Thought content does not include homicidal or suicidal ideation.     ED Results / Procedures / Treatments   Labs (all labs ordered are listed, but only abnormal results are displayed) Labs Reviewed - No data to display  EKG None  Radiology No results found.  Procedures Procedures (including critical care time)  Medications Ordered in ED Medications - No data to display  ED Course  I have reviewed the triage vital signs and the nursing notes.  Pertinent labs & imaging results that were available during my care of the patient were reviewed by me and considered in my medical decision making (see chart for details).    MDM Rules/Calculators/A&P                          Patient here 5 days ago with complaint of command hallucinations.  I spoke with Hillery Jacks, NP of the behavioral health urgent care.  Given patient's low risk complaints, lack of suicidal or homicidal ideation and chronic auditory hallucinations with recent medical work-up I feel he is medically clear and safe for transport to be hot for further management and evaluation. Final Clinical Impression(s) / ED Diagnoses Final diagnoses:  None    Rx / DC Orders ED Discharge Orders    None       Arthor Captain, PA-C 10/17/20 1252    Sabino Donovan, MD 10/18/20 [phone]

## 2020-10-17 NOTE — ED Triage Notes (Signed)
Patient states he is hearing voices and wants to talk to someone, denies SI/HI

## 2020-10-17 NOTE — BH Assessment (Addendum)
Comprehensive Clinical Assessment (CCA) Screening, Triage and Referral Note  10/17/2020 Cole Barrett [id number]   Patient is a 34 y.o. male with a history of Schizoaffective Disorder, bipolar type who presents to [name] as a transfer from [name] for assessment. He had presented to the ED reporting he was hearing voices telling him to kill himself.  Patient is well known to ED and [name] staff, as he presents often requesting a dose of his Risperdal.  Patient denies SI, HI and endorses auditory hallucinations, which are chronic.  Patient was managed by [name], however it seems he often depended on the ED for refills or one time doses.  Upon assessment today, patient is sleeping and had to be woken up.  He states he is here to talk to a counselor and stay for a few days.  He reports ongoing hallucinations, however states they are telling him they will harm him at this point.  He states he takes medications, however it is unclear if he takes medicine consistently, given the numerous ED visits.  Patient states he works for [name] full time [name] in [place].  He denies complaints about work.  He states he has no support from family and he lives alone.  Patient confirms he does not receive disability and his company does not provide insurance benefits.  He is a Economist patient and can be managed by the [name] outpatient program.  He had not heard of [facility] or [facility] and is open to following up with [name] for outpatient treatment.    Disposition: Per [name], NP patient does not meet inpatient criteria.  He has been referred to [name] and will be provided with Open Access hours.  Information included in the AVS to be provided to pt upon d/c.   Per ** Chief Complaint:  Chief Complaint  Patient presents with  . Hallucinations   Visit Diagnosis: Schizoaffective Disorder, bipolar type  Patient Reported Information How did you hear about [place]?  Self   Referral name: Patient transferred from [facility] for assessment.   Referral phone number: No data recorded Whom do you see for routine medical problems? I don't have a doctor   Practice/Facility Name: No data recorded  Practice/Facility Phone Number: No data recorded  Name of Contact: No data recorded  Contact Number: No data recorded  Contact Fax Number: No data recorded  Prescriber Name: No data recorded  Prescriber Address (if known): No data recorded What Is the Reason for Your Visit/Call Today? Patient reports hearing voices saying they will harm him.  How Long Has This Been Causing You Problems? 1 wk - 1 month  Have You Recently Been in Any Inpatient Treatment (Hospital/Detox/Crisis Center/28-Day Program)? No   Name/Location of Program/Hospital:No data recorded  How Long Were You There? No data recorded  When Were You Discharged? No data recorded Have You Ever Received Services From [facility] Before? Yes   Who Do You See at [facility]? ED visits  Have You Recently Had Any Thoughts About Hurting Yourself? No   Are You Planning to Commit Suicide/Harm Yourself At This time?  No  Have you Recently Had Thoughts About Hurting Someone [name]? No   Explanation: No data recorded Have You Used Any Alcohol or Drugs in the Past 24 Hours? No   How Long Ago Did You Use Drugs or Alcohol?  No data recorded  What Did You Use and How Much? No data recorded What Do You Feel Would  Help You the Most Today? Assessment Only  Do You Currently Have a Therapist/Psychiatrist? Yes   Name of Therapist/Psychiatrist: Patient states he is followed by [name], however they are not seeing [name] patients.   Have You Been Recently Discharged From Any Office Practice or Programs? No   Explanation of Discharge From Practice/Program:  No data recorded    CCA Screening Triage Referral Assessment Type of Contact: Face-to-Face   Is this Initial or Reassessment? Initial  Assessment   Date Telepsych consult ordered in [facility]:  09/15/20   Time Telepsych consult ordered in [facility]:  2126  Patient Reported Information Reviewed? Yes   Patient Left Without Being Seen? No data recorded  Reason for Not Completing Assessment: No data recorded Collateral Involvement: N/A  Does Patient Have a Court Appointed Legal Guardian? No data recorded  Name and Contact of Legal Guardian:  Self.   If Minor and Not Living with Parent(s), Who has Custody? N/A  Is CPS involved or ever been involved? Never  Is APS involved or ever been involved? Never  Patient Determined To Be At Risk for Harm To Self or Others Based on Review of Patient Reported Information or Presenting Complaint? No   Method: No data recorded  Availability of Means: No data recorded  Intent: No data recorded  Notification Required: No data recorded  Additional Information for Danger to Others Potential:  No data recorded  Additional Comments for Danger to Others Potential:  No data recorded  Are There Guns or Other Weapons in Your Home?  No data recorded   Types of Guns/Weapons: No data recorded   Are These Weapons Safely Secured?                              No data recorded   Who Could Verify You Are Able To Have These Secured:    No data recorded Do You Have any Outstanding Charges, Pending Court Dates, Parole/Probation? No data recorded Contacted To Inform of Risk of Harm To Self or Others: Other: Comment (N/A)  Location of Assessment: [facility] Services  Does Patient Present under Involuntary Commitment? No   IVC Papers Initial File Date: No data recorded  [place] of Residence: [name]  Patient Currently Receiving the Following Services: Not Receiving Services   Determination of Need: Routine (7 days)   Options For Referral: Medication Management;Outpatient Therapy   Yetta Glassman

## 2020-10-17 NOTE — ED Notes (Signed)
Patient wanded at triage

## 2020-10-17 NOTE — Discharge Instructions (Signed)
You are encouraged to follow up with [facility] (upstairs in this building) for open access walk in hours to see a provider and therapist.   [address] [place], [place] [phone]  Open Access Hours: Monday-Thursday : 8AM-11AM Fridays - Open Access to see a therapist 1PM-4PM  Once you meet with providers, you can schedule follow up appointments with them.

## 2020-10-17 NOTE — ED Provider Notes (Signed)
Behavioral Health Urgent Care Medical Screening Exam  Patient Name: Cole Barrett MRN: [id number] Date of Evaluation: 10/17/20 Chief Complaint:   Diagnosis:  Final diagnoses:  Schizoaffective disorder, bipolar type (HCC)  Auditory hallucinations    History of Present illness: Cole Barrett is a 34 y.o. male patient presents to [facility] as a walk in with complaints of auditory hallucinations.  Cole Barrett, 34 y.o., male patient seen face to face by this provider, consulted with Dr. Bronwen Betters; and chart reviewed on 10/17/20.  On evaluation Cole Barrett reports he is hearing voices and that he just wanted to talk to someone and get set up with therapy.  States that he goes to [facility] every 3 months for medication management but has no therapy.  States he is also prescribed psychotropic medications and is compliant.  Patient states that he is employed and works Monday - Friday and has had no problems with work.  He lives a lone and states he has no support system.  States that he hears the voices daily and sometimes they are worse than others.   During evaluation Cole Barrett is sitting up in chair sleep; patient had to be awaken to start assessment.  Patient is alert/oriented x 4; calm/cooperative; and mood is congruent with affect.  He does not appear to be responding to internal/external stimuli or delusional thoughts; but states he is hearing voices telling him they are going to kill him.  Patient denies suicidal/self-harm/homicidal ideation, and paranoia.  Patient answered question appropriately.     Psychiatric Specialty Exam  Presentation  General Appearance:Appropriate for Environment;Casual;Disheveled  Eye Contact:Good  Speech:Clear and Coherent;Normal Rate  Speech Volume:Normal  Handedness:Right   Mood and Affect  Mood:Euthymic  Affect:Appropriate;Congruent   Thought Process  Thought Processes:Coherent;Goal  Directed  Descriptions of Associations:Intact  Orientation:Full (Time, Place and Person)  Thought Content:WDL  Hallucinations:Auditory Patient states he is hearing voices telling him that "they are going to kill me"  Ideas of Reference:None  Suicidal Thoughts:No  Homicidal Thoughts:No   Sensorium  Memory:Immediate Good;Recent Good  Judgment:Intact  Insight:Present   Executive Functions  Concentration:Good  Attention Span:Good  Recall:Good  Fund of Knowledge:Fair  Language:Good   Psychomotor Activity  Psychomotor Activity:Normal   Assets  Assets:Communication Skills;Desire for Improvement;Housing   Sleep  Sleep:Good  Number of hours: No data recorded  Physical Exam: Physical Exam Vitals and nursing note reviewed.  Constitutional:      Appearance: Normal appearance.  HENT:     Head: Normocephalic and atraumatic.  Cardiovascular:     Rate and Rhythm: Normal rate and regular rhythm.  Pulmonary:     Effort: Pulmonary effort is normal.     Breath sounds: Normal breath sounds.  Musculoskeletal:        General: Normal range of motion.     Cervical back: Normal range of motion and neck supple.  Skin:    General: Skin is warm and dry.  Neurological:     General: No focal deficit present.     Mental Status: He is alert and oriented to person, place, and time.  Psychiatric:        Attention and Perception: Attention and perception normal. Auditory hallucinations: Reporting auditory hallucinations but does not appear to be responding to voices.        Mood and Affect: Affect normal.        Speech: Speech normal.        Behavior: Behavior normal. Behavior is cooperative.  Thought Content: Thought content normal. Thought content is not paranoid or delusional. Thought content does not include homicidal or suicidal ideation.        Cognition and Memory: Cognition and memory normal.        Judgment: Judgment is impulsive.    Review of Systems   Psychiatric/Behavioral: Negative for memory loss. Depression: Stable. Hallucinations: States he is hearing voices and wants to get set up for therapy to talk to someone. Suicidal ideas: Denies. The patient has insomnia.   All other systems reviewed and are negative.  Blood pressure (!) 143/69, pulse 76, temperature (!) 97.5 F (36.4 C), temperature source Temporal, resp. rate 18, SpO2 100 %. There is no height or weight on file to calculate BMI.  Musculoskeletal: Strength & Muscle Tone: within normal limits Gait & Station: normal Patient leans: N/A   [facility] MSE Discharge Disposition for Follow up and Recommendations: Based on my evaluation the patient does not appear to have an emergency medical condition and can be discharged with resources and follow up care in outpatient services for Medication Management, Individual Therapy and Group Therapy     Discharge Instructions     You are encouraged to follow up with [facility] (upstairs in this building) for open access walk in hours to see a provider and therapist.   [address] [place], [place] [phone]  Open Access Hours: Monday-Thursday : 8AM-11AM Fridays - Open Access to see a therapist 1PM-4PM  Once you meet with providers, you can schedule follow up appointments with them.          Jaylinn Hellenbrand, NP 10/17/2020, 11:55 AM

## 2020-10-17 NOTE — ED Notes (Signed)
Called [facility] 10:08 per [employer] called by MS

## 2020-10-20 ENCOUNTER — Other Ambulatory Visit: Payer: Self-pay

## 2020-10-20 ENCOUNTER — Encounter (HOSPITAL_COMMUNITY): Payer: Self-pay | Admitting: Emergency Medicine

## 2020-10-20 ENCOUNTER — Emergency Department (HOSPITAL_COMMUNITY)
Admission: EM | Admit: 2020-10-20 | Discharge: 2020-10-20 | Disposition: A | Discharge status: Home / Self Care | Payer: Self-pay | Attending: Emergency Medicine | Admitting: Emergency Medicine

## 2020-10-20 DIAGNOSIS — Z760 Encounter for issue of repeat prescription: Secondary | ICD-10-CM | POA: Insufficient documentation

## 2020-10-20 DIAGNOSIS — F17210 Nicotine dependence, cigarettes, uncomplicated: Secondary | ICD-10-CM | POA: Insufficient documentation

## 2020-10-20 MED ORDER — RISPERIDONE 3 MG PO TABS
3.0000 mg | ORAL_TABLET | Freq: Once | ORAL | Status: AC
  Administered 2020-10-20: 3 mg via ORAL
  Filled 2020-10-20: qty 1

## 2020-10-20 NOTE — ED Triage Notes (Signed)
Patient requesting medication refill for his Risperidal .

## 2020-10-20 NOTE — ED Provider Notes (Signed)
[name] EMERGENCY DEPARTMENT Provider Note   CSN: [id number] Arrival date & time: 10/20/20  1949     History Chief Complaint  Patient presents with  . Medication Refill    Cole Barrett is a 34 y.o. male.  The history is provided by the patient.  Medication Refill Medications/supplies requested:  Patient reports that he is staying over here with friends tonight and he cannot get his prescription for Risperdal to get some sleep.  He reports he checked in just to get a dose of his Risperdal tonight.  He otherwise has no complaints and states he is feeling fine. Reason for request:  Medications not available Medications taken before: yes - see home medications        Past Medical History:  Diagnosis Date  . Homelessness   . Hypercholesterolemia   . Mental disorder   . Paranoid behavior (HCC)   . PE (pulmonary embolism)   . Schizophrenia [facility])     Patient Active Problem List   Diagnosis Date Noted  . Acute psychosis (HCC) 08/31/2019  . Schizophrenia (HCC) 08/31/2019  . Schizoaffective disorder, bipolar type (HCC) 01/19/2019  . Alcohol abuse   . Auditory hallucinations   . Alcohol abuse with alcohol-induced mood disorder (HCC) 09/08/2016  . Homelessness 09/03/2016  . Person feigning illness 09/03/2016  . 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 Cannabis use disorder, mild, abuse 08/29/2016  . Pulmonary emboli (HCC) 07/13/2016  . Adjustment disorder with emotional disturbance 03/12/2014  . 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 Acute left flank pain 01/02/2014  . Pulmonary embolism, bilateral (HCC) 01/02/2014    History reviewed. No pertinent surgical history.     Family History  Problem Relation Age of Onset  . Hypertension Mother     Social History   Tobacco Use  . Smoking status: Current Every Day Smoker    Packs/day: 0.50    Types: Cigarettes  . Smokeless tobacco: Never Used  Vaping Use  . Vaping  Use: Never used  Substance Use Topics  . Alcohol use: Yes    Comment: BAC not available  . Drug use: Yes    Types: Marijuana    Comment: Last used: 2 months ago UDS NA    Home Medications Prior to Admission medications   Medication Sig Start Date End Date Taking? Authorizing Provider  OLANZapine (ZYPREXA) 2.5 MG tablet Take 1 tablet (2.5 mg total) by mouth at bedtime. Patient not taking: Reported on 10/17/2020 09/30/20   Gailen Shelter, PA  risperiDONE (RISPERDAL) 1 MG tablet Take 1 tablet by mouth each morning.  Take 3 tablets by mouth at bedtime. Patient taking differently: Take 1 mg by mouth at bedtime. . 09/30/20   Gailen Shelter, PA  amantadine (SYMMETREL) 100 MG capsule Take 1 capsule (100 mg total) by mouth 2 (two) times daily. Patient not taking: Reported on 11/04/2019 09/01/19 11/17/19  [name], NP    Allergies    Haldol [haloperidol]  Review of Systems   Review of Systems  All other systems reviewed and are negative.   Physical Exam Updated Vital Signs BP (!) 147/89 (BP Location: Right Arm)   Pulse 77   Temp 97.9 F (36.6 C) (Oral)   Resp 16   SpO2 96%   Physical Exam Vitals and nursing note reviewed.  Constitutional:      General: He is not in acute distress.    Appearance: He is normal weight.     Comments:  Slightly disheveled  HENT:     Head: Normocephalic.     Mouth/Throat:     Mouth: Mucous membranes are moist.  Cardiovascular:     Rate and Rhythm: Normal rate and regular rhythm.     Pulses: Normal pulses.  Pulmonary:     Effort: Pulmonary effort is normal.     Breath sounds: Normal breath sounds.  Skin:    General: Skin is warm.  Neurological:     Mental Status: He is alert. Mental status is at baseline.  Psychiatric:     Comments: Calm and cooperative     ED Results / Procedures / Treatments   Labs (all labs ordered are listed, but only abnormal results are displayed) Labs Reviewed - No data to  display  EKG None  Radiology No results found.  Procedures Procedures (including critical care time)  Medications Ordered in ED Medications  risperiDONE (RISPERDAL) tablet 3 mg (has no administration in time range)    ED Course  I have reviewed the triage vital signs and the nursing notes.  Pertinent labs & imaging results that were available during my care of the patient were reviewed by me and considered in my medical decision making (see chart for details).    MDM Rules/Calculators/A&P                          Patient here tonight reporting that he just wants a dose of his Risperdal.  He says [place] side [place] and [place] is where his prescription is but he has no way to get there and just wants to get a good night sleep.  He otherwise does not appear to be responding to internal stimuli at this time.  He is calm and cooperative.  He has no further complaints.  Patient was given a dose of his Risperdal and discharge. Final Clinical Impression(s) / ED Diagnoses Final diagnoses:  Medication refill    Rx / DC Orders ED Discharge Orders    None       Gwyneth Sprout, MD 10/20/20 2252

## 2020-10-23 ENCOUNTER — Other Ambulatory Visit: Payer: Self-pay

## 2020-10-23 ENCOUNTER — Encounter (HOSPITAL_COMMUNITY): Payer: Self-pay

## 2020-10-23 ENCOUNTER — Emergency Department (HOSPITAL_COMMUNITY)
Admission: EM | Admit: 2020-10-23 | Discharge: 2020-10-24 | Disposition: A | Discharge status: Home / Self Care | Payer: Self-pay | Attending: Emergency Medicine | Admitting: Emergency Medicine

## 2020-10-23 DIAGNOSIS — R440 Auditory hallucinations: Secondary | ICD-10-CM | POA: Insufficient documentation

## 2020-10-23 DIAGNOSIS — F17210 Nicotine dependence, cigarettes, uncomplicated: Secondary | ICD-10-CM | POA: Insufficient documentation

## 2020-10-23 NOTE — ED Provider Notes (Signed)
[facility]-EMERGENCY DEPT [facility] Emergency Department Provider Note MRN:  [id number]  Arrival date & time: 10/24/20     Chief Complaint   Psychiatric Evaluation   History of Present Illness   Cole Barrett is a 34 y.o. year-old male with a history of schizophrenia, pulmonary realism presenting to the ED with chief complaint of psychiatric evaluation.  Patient explains that things were going well until today when the hallucinations began to worsen.  He has chronic auditory hallucinations but they became much more frequent, louder, more bothersome today.  Voices telling him to kill himself.  He is taking his Risperdal as directed.  Denies any personal SI or HI.  Review of Systems  A complete 10 system review of systems was obtained and all systems are negative except as noted in the HPI and PMH.   Patient's Health History    Past Medical History:  Diagnosis Date  . Homelessness   . Hypercholesterolemia   . Mental disorder   . Paranoid behavior (HCC)   . PE (pulmonary embolism)   . Schizophrenia (HCC)     History reviewed. No pertinent surgical history.  Family History  Problem Relation Age of Onset  . Hypertension Mother     Social History   Socioeconomic History  . Marital status: Single    Spouse name: Not on file  . Number of children: Not on file  . Years of education: Not on file  . Highest education level: Not on file  Occupational History  . Occupation: Employed    Comment: [employer]  Tobacco Use  . Smoking status: Current Every Day Smoker    Packs/day: 0.50    Types: Cigarettes  . Smokeless tobacco: Never Used  Vaping Use  . Vaping Use: Never used  Substance and Sexual Activity  . Alcohol use: Yes    Comment: BAC not available  . Drug use: Yes    Types: Marijuana    Comment: Last used: 2 months ago UDS NA  . Sexual activity: Never  Other Topics Concern  . Not on file  Social History Narrative   ** Merged History Encounter **     Pt lives alone in [place].  He is employed.  He stated that he is followed by [facility], but that he has not been there in a while.   Social Determinants of Health   Financial Resource Strain:   . Difficulty of Paying Living Expenses: Not on file  Food Insecurity:   . Worried About Programme researcher, broadcasting/film/video in the Last Year: Not on file  . Ran Out of Food in the Last Year: Not on file  Transportation Needs:   . Lack of Transportation (Medical): Not on file  . Lack of Transportation (Non-Medical): Not on file  Physical Activity:   . Days of Exercise per Week: Not on file  . Minutes of Exercise per Session: Not on file  Stress:   . Feeling of Stress : Not on file  Social Connections:   . Frequency of Communication with Friends and Family: Not on file  . Frequency of Social Gatherings with Friends and Family: Not on file  . Attends Religious Services: Not on file  . Active Member of Clubs or Organizations: Not on file  . Attends Banker Meetings: Not on file  . Marital Status: Not on file  Intimate Partner Violence:   . Fear of Current or Ex-Partner: Not on file  . Emotionally Abused: Not on file  .  Physically Abused: Not on file  . Sexually Abused: Not on file     Physical Exam   Vitals:   10/23/20 2226  BP: (!) 149/85  Pulse: 79  Resp: 18  Temp: 98.8 F (37.1 C)  SpO2: 96%    CONSTITUTIONAL: Well-appearing, NAD NEURO:  Alert and oriented x 3, no focal deficits EYES:  eyes equal and reactive ENT/NECK:  no LAD, no JVD CARDIO: Regular rate, well-perfused, normal S1 and S2 PULM:  CTAB no wheezing or rhonchi GI/GU:  normal bowel sounds, non-distended, non-tender MSK/SPINE:  No gross deformities, no edema SKIN:  no rash, atraumatic PSYCH:  Appropriate speech and behavior  *Additional and/or pertinent findings included in MDM below  Diagnostic and Interventional Summary    EKG Interpretation  Date/Time:    Ventricular Rate:    PR Interval:    QRS  Duration:   QT Interval:    QTC Calculation:   R Axis:     Text Interpretation:        Labs Reviewed - No data to display  No orders to display    Medications - No data to display   Procedures  /  Critical Care Procedures  ED Course and Medical Decision Making  I have reviewed the triage vital signs, the nursing notes, and pertinent available records from the EMR.  Listed above are laboratory and imaging tests that I personally ordered, reviewed, and interpreted and then considered in my medical decision making (see below for details).  Endorsing worsening hallucinations, unclear if being genuine or if mostly here for secondary gain in the form of a place to sleep.  Medically cleared, consulting TTS.     TTS recommending outpatient management, appropriate for discharge.  Elmer Sow. Pilar Plate, MD [facility] Emergency Medicine [facility] [email or web address]  Final Clinical Impressions(s) / ED Diagnoses     ICD-10-CM   1. Auditory hallucinations  R44.0     ED Discharge Orders    None       Discharge Instructions Discussed with and Provided to Patient:     Discharge Instructions     You were evaluated in the Emergency Department and after careful evaluation, we did not find any emergent condition requiring admission or further testing in the hospital.  Your exam/testing today was overall reassuring.  Please follow-up at the [facility] on Monday at 9:00 am to begin outpatient mental health treatment.  Please return to the Emergency Department if you experience any worsening of your condition.  Thank you for allowing [place] to be a part of your care.        Sabas Sous, MD 10/24/20 [phone]

## 2020-10-23 NOTE — ED Triage Notes (Signed)
Pt requesting to speak with behavioral health.

## 2020-10-24 NOTE — ED Notes (Signed)
Pt on with [facility]

## 2020-10-24 NOTE — BH Assessment (Addendum)
TTS TELE-ASSESSMENT  Pt is a 34 year old single male who presents unaccompanied to [facility] requesting to see a psychiatrist for medication evaluation due to auditory hallucinations. Pt has a diagnosis of schizoaffective disorder, bipolar type. He frequently presents to local emergency departments requesting medication for hallucinations and has been evaluated twice within the past week. Pt says he always experiences hallucinations but today they were more intense than normal. He says he hears voices telling him they are going to kill him, which is what Pt typically reports. Pt states he is taking Risperdal, which he has been getting through the ED. Pt has a history of outpatient mental health treatment at [facility] but says he currently has no mental health providers. Pt describes his mood recently as "good." He denies depressive symptoms. He denies problems with sleep. He denies problems with appetite. Pt denies current suicidal ideation. He denies homicidal ideation or thoughts of harming others. Pt denies alcohol or other substance use.  Pt identifies auditory hallucinations as his only stressor, stating everything else is going well. He says he has a permanent, safe place to stay and lives alone. He says he does not have any family or friends who are supportive and does not like to be around people. He denies any legal problems. He denies history of abuse. He denies access to firearms. Pt reports he has been psychiatrically hospitalized in the past, most recently at [facility] in October 2020.  Pt does not identify anyone to contact for collateral information.  Pt is dressed in dirty clothing, alert and oriented x4. Pt speaks in a clear tone, at moderate volume and normal pace. Motor behavior appears normal. Eye contact is good. Pt's mood is euthymic and affect is congruent with mood. Thought process is coherent and relevant. There is no indication from Pt's behavior that he is currently responding to  internal stimuli or experiencing delusional thought content. Pt was pleasant and cooperative throughout assessment.   DIAGNOSIS: F25.0 Schizoaffective disorder, Bipolar type  DISPOSITION: Gave clinical report to Cole Murdoch, NP who said Pt does not meet criteria for inpatient psychiatric treatment. Recommendation is for Pt to present to [facility] on 10/26/2020 at 0900 to begin outpatient mental health treatment. Notified Dr. Kennis Carina and Harrington Challenger, RN of recommendation.  Pamalee Leyden, [facility] Triage Specialist [phone]    CCA completed by Cole Barrett, [facility] on 10/17/2020:  10/17/2020 Cole [facility] [phone]    Patient is a 34 y.o. male with a history of Schizoaffective Disorder, bipolar type who presents to [facility] as a transfer from [facility] for assessment. He had presented to the ED reporting he was hearing voices telling him to kill himself.  Patient is well known to ED and [facility] staff, as he presents often requesting a dose of his Risperdal.  Patient denies SI, HI and endorses auditory hallucinations, which are chronic.  Patient was managed by [facility], however it seems he often depended on the ED for refills or one time doses.  Upon assessment today, patient is sleeping and had to be woken up.  He states he is here to talk to a counselor and stay for a few days.  He reports ongoing hallucinations, however states they are telling him they will harm him at this point.  He states he takes medications, however it is unclear if he takes medicine consistently, given the numerous ED visits.  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  He denies complaints about work.  He states he has no support from family and he lives alone.  Patient confirms he does not receive disability and his company does not provide insurance benefits.  He is a Economist  patient and can be managed by the [facility] outpatient program.  He had not heard of [facility] or [facility] and is open to following up with [facility] for outpatient treatment.     Disposition: Per Cole Found, NP patient does not meet inpatient criteria.  He has been referred to [facility] and will be provided with Open Access hours.  Information included in the AVS to be provided to pt upon d/c.    Per ** Chief Complaint:     Chief Complaint  Patient presents with  . Hallucinations    Visit Diagnosis: Schizoaffective Disorder, bipolar type   Patient Reported Information How did you hear about [place]? Self               Referral name: Patient transferred from [facility] for assessment.               Referral phone number: No data recorded Whom do you see for routine medical problems? I don't have a doctor               Practice/Facility Name: No data recorded             Practice/Facility Phone Number: No data recorded             Name of Contact: No data recorded             Contact Number: No data recorded             Contact Fax Number: No data recorded             Prescriber Name: No data recorded             Prescriber Address (if known): No data recorded What Is the Reason for Your Visit/Call Today? Patient reports hearing voices saying they will harm him.   How Long Has This Been Causing You Problems? 1 wk - 1 month   Have You Recently Been in Any Inpatient Treatment (Hospital/Detox/Crisis Center/28-Day Program)? No               Name/Location of Program/Hospital:No data recorded             How Long Were You There? No data recorded             When Were You Discharged? No data recorded Have You Ever Received Services From [facility] Before? Yes               Who Do You See at [facility]? ED visits   Have You Recently Had Any Thoughts About Hurting Yourself? No               Are You Planning to Commit Suicide/Harm Yourself At This time?      No   Have you Recently Had Thoughts About Hurting  Someone [name]? No               Explanation: No data recorded Have You Used Any Alcohol or Drugs in the Past 24 Hours? No               How Long Ago Did You Use Drugs or Alcohol?   No data recorded  What Did You Use and How Much? No data recorded What Do You Feel Would Help You the Most Today? Assessment Only   Do You Currently Have a Therapist/Psychiatrist? Yes               Name of Therapist/Psychiatrist: Patient states he is followed by [facility], however they are not seeing [facility] indigent patients.     Have You Been Recently Discharged From Any Office Practice or Programs? No               Explanation of Discharge From Practice/Program:          No data recorded                CCA Screening Triage Referral Assessment Type of Contact: Face-to-Face               Is this Initial or Reassessment? Initial Assessment               Date Telepsych consult ordered in [facility]:   09/15/20               Time Telepsych consult ordered in [facility]:  2126   Patient Reported Information Reviewed? Yes               Patient Left Without Being Seen? No data recorded             Reason for Not Completing Assessment: No data recorded Collateral Involvement: N/A   Does Patient Have a Court Appointed Legal Guardian? No data recorded             Name and Contact of Legal Guardian:       Self.    If Minor and Not Living with Parent(s), Who has Custody? N/A   Is CPS involved or ever been involved? Never   Is APS involved or ever been involved? Never   Patient Determined To Be At Risk for Harm To Self or Others Based on Review of Patient Reported Information or Presenting Complaint? No               Method: No data recorded             Availability of Means: No data recorded             Intent: No data recorded             Notification Required: No data recorded             Additional Information for Danger to Others Potential:   No data recorded             Additional Comments for  Danger to Others Potential:    No data recorded             Are There Guns or Other Weapons in Your Home?         No data recorded                         Types of Guns/Weapons: No data recorded                         Are These Weapons Safely Secured?  No data recorded                         Who Could Verify You Are Able To Have These Secured:                                  No data recorded Do You Have any Outstanding Charges, Pending Court Dates, Parole/Probation? No data recorded Contacted To Inform of Risk of Harm To Self or Others: Other: Comment (N/A)   Location of Assessment: [facility] Assessment Services   Does Patient Present under Involuntary Commitment? No               IVC Papers Initial File Date: No data recorded   [place] of Residence: [name]   Patient Currently Receiving the Following Services: Not Receiving Services     Determination of Need: Routine (7 days)     Options For Referral: Medication Management;Outpatient Therapy     Yetta Glassman

## 2020-10-24 NOTE — Discharge Instructions (Addendum)
You were evaluated in the Emergency Department and after careful evaluation, we did not find any emergent condition requiring admission or further testing in the hospital.  Your exam/testing today was overall reassuring.  Please follow-up at the [facility] on Monday at 9:00 am to begin outpatient mental health treatment.  Please return to the Emergency Department if you experience any worsening of your condition.  Thank you for allowing [place] to be a part of your care.

## 2020-10-27 ENCOUNTER — Emergency Department (HOSPITAL_COMMUNITY)
Admission: EM | Admit: 2020-10-27 | Discharge: 2020-10-27 | Disposition: A | Discharge status: Home / Self Care | Payer: Self-pay | Attending: Emergency Medicine | Admitting: Emergency Medicine

## 2020-10-27 ENCOUNTER — Other Ambulatory Visit: Payer: Self-pay

## 2020-10-27 ENCOUNTER — Encounter (HOSPITAL_COMMUNITY): Payer: Self-pay | Admitting: Emergency Medicine

## 2020-10-27 DIAGNOSIS — R440 Auditory hallucinations: Secondary | ICD-10-CM | POA: Insufficient documentation

## 2020-10-27 DIAGNOSIS — F17210 Nicotine dependence, cigarettes, uncomplicated: Secondary | ICD-10-CM | POA: Insufficient documentation

## 2020-10-27 DIAGNOSIS — Z20822 Contact with and (suspected) exposure to covid-19: Secondary | ICD-10-CM | POA: Insufficient documentation

## 2020-10-27 LAB — RESP PANEL BY RT-PCR (FLU A&B, COVID) ARPGX2
Influenza A by PCR: NEGATIVE
Influenza B by PCR: NEGATIVE
SARS Coronavirus 2 by RT PCR: NEGATIVE

## 2020-10-27 MED ORDER — RISPERIDONE 1 MG PO TABS
1.0000 mg | ORAL_TABLET | Freq: Once | ORAL | Status: AC
  Administered 2020-10-27: 1 mg via ORAL
  Filled 2020-10-27: qty 1

## 2020-10-27 NOTE — ED Provider Notes (Addendum)
[name] EMERGENCY DEPARTMENT Provider Note   CSN: [id number] Arrival date & time: 10/27/20  0093     History Chief Complaint  Patient presents with  . Hallucinations    Cole Barrett is a 34 y.o. male w PMHx of schizophrenia, polysubstance abuse, PE, presenting to the ED with worsening hallucinations that began last night.  Patient states the auditory hallucinations were overwhelming.  They were not negative, however just too much to deal with.  He denies SI or HI.  He has been taking his antipsychotic medication as directed.  He is requesting assistance from behavioral health.  No medical complaints.  The history is provided by the patient.       Past Medical History:  Diagnosis Date  . Homelessness   . Hypercholesterolemia   . Mental disorder   . Paranoid behavior (HCC)   . PE (pulmonary embolism)   . Schizophrenia [facility])     Patient Active Problem List   Diagnosis Date Noted  . Acute psychosis (HCC) 08/31/2019  . Schizophrenia (HCC) 08/31/2019  . Schizoaffective disorder, bipolar type (HCC) 01/19/2019  . Alcohol abuse   . Auditory hallucinations   . Alcohol abuse with alcohol-induced mood disorder (HCC) 09/08/2016  . Homelessness 09/03/2016  . Person feigning illness 09/03/2016  . 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 Cannabis use disorder, mild, abuse 08/29/2016  . Pulmonary emboli (HCC) 07/13/2016  . Adjustment disorder with emotional disturbance 03/12/2014  . 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 Acute left flank pain 01/02/2014  . Pulmonary embolism, bilateral (HCC) 01/02/2014    History reviewed. No pertinent surgical history.     Family History  Problem Relation Age of Onset  . Hypertension Mother     Social History   Tobacco Use  . Smoking status: Current Every Day Smoker    Packs/day: 0.50    Types: Cigarettes  . Smokeless tobacco: Never Used  Vaping Use  . Vaping Use: Never used   Substance Use Topics  . Alcohol use: Yes    Comment: BAC not available  . Drug use: Yes    Types: Marijuana    Comment: Last used: 2 months ago UDS NA    Home Medications Prior to Admission medications   Medication Sig Start Date End Date Taking? Authorizing Provider  OLANZapine (ZYPREXA) 2.5 MG tablet Take 1 tablet (2.5 mg total) by mouth at bedtime. Patient not taking: Reported on 10/17/2020 09/30/20   Gailen Shelter, PA  risperiDONE (RISPERDAL) 1 MG tablet Take 1 tablet by mouth each morning.  Take 3 tablets by mouth at bedtime. Patient taking differently: Take 1 mg by mouth at bedtime. . 09/30/20   Gailen Shelter, PA  amantadine (SYMMETREL) 100 MG capsule Take 1 capsule (100 mg total) by mouth 2 (two) times daily. Patient not taking: Reported on 11/04/2019 09/01/19 11/17/19  [name], NP    Allergies    Haldol [haloperidol]  Review of Systems   Review of Systems  Psychiatric/Behavioral: Positive for hallucinations.  All other systems reviewed and are negative.   Physical Exam Updated Vital Signs BP 129/79 (BP Location: Right Arm)   Pulse 71   Temp 98 F (36.7 C) (Oral)   Resp 18   SpO2 98%   Physical Exam Vitals and nursing note reviewed.  Constitutional:      General: He is not in acute distress.    Appearance: He is well-developed.     Comments: Poor hygiene  HENT:  Head: Normocephalic and atraumatic.  Eyes:     Conjunctiva/sclera: Conjunctivae normal.  Cardiovascular:     Rate and Rhythm: Normal rate.  Pulmonary:     Effort: Pulmonary effort is normal.  Abdominal:     Palpations: Abdomen is soft.  Skin:    General: Skin is warm.  Neurological:     Mental Status: He is alert.  Psychiatric:        Mood and Affect: Mood normal.        Behavior: Behavior normal.     Comments: Patient is calm and cooperative, appears appropriate.  Does not appear to be actively responding to any internal stimuli     ED Results / Procedures / Treatments    Labs (all labs ordered are listed, but only abnormal results are displayed) Labs Reviewed  RESP PANEL BY RT-PCR (FLU A&B, COVID) [id number]    EKG None  Radiology No results found.  Procedures Procedures (including critical care time)  Medications Ordered in ED Medications - No data to display  ED Course  I have reviewed the triage vital signs and the nursing notes.  Pertinent labs & imaging results that were available during my care of the patient were reviewed by me and considered in my medical decision making (see chart for details).  Clinical Course as of Oct 28 1415  Tue Oct 27, 2020  0258 Discussed with Feliz Beam money midlevel at [facility].  Patient will be transferred over for evaluation and stabilization.  He does not require IVC at this time, is not SI or HI, medically cleared for further psychiatric care   [JR]  1100 Patient states he now feels much better, he hallucinations have calmed down and he feels as though he can go to work.  No longer feels as though he needs psychiatric assistance.  He remains calm, cooperative, appropriate.  Believe patient is safe for discharge.  He is requesting his morning dose of risperidone as he does not have it with him.   [JR]    Clinical Course User Index [JR] Miner Koral, [place] N, PA-C   MDM Rules/Calculators/A&P                          Patient with history of schizophrenia, seen frequently in the ED and behavioral health, presenting with worsening auditory hallucinations.  No SI or HI, he is acting appropriately in the ED, calm and cooperative.  Given frequent ED visits, do not believe the need for repeat laboratory work-up is indicated at this time with absence of medical complaints.  Discussed with nurse practitioner Reola Calkins at behavioral health, patient will be transferred for psychiatric evaluation and stabilization.  Do not believe patient requires involuntary commitment if he decides he no longer wants psychiatric assistance.   Patient is stable for transfer, adequately clear for psychiatric evaluation and treatment.  Covid swab collected prior to transfer.   Pending transfer, patient now reports he feels much better.  His hallucinations have improved and he feels stable for outpatient management.  He states he feels as though he can go to work, no longer requests psychiatric stabilization.  He does request his morning dose of risperidone.  Patient is calm and cooperative, appropriate, maintaining conversation with eye contact and does not appear to be actively hallucinating or altered.  Patient is stable for discharge at this time. Final Clinical Impression(s) / ED Diagnoses Final diagnoses:  Auditory hallucination    Rx / DC Orders ED Discharge Orders  None       Naseem Varden, [place] N, PA-C 10/27/20 0855    Cong Hightower, [place] N, PA-C 10/27/20 1417    Alvira Monday, MD 10/30/20 [phone]

## 2020-10-27 NOTE — ED Notes (Signed)
Lunch Tray Ordered @ 1028. 

## 2020-10-27 NOTE — ED Triage Notes (Signed)
Patient here tonight requesting to talk to someone at Behavior Health.  Patient denies any SI/HI.  Patient states he has been taking his meds.

## 2020-10-27 NOTE — Discharge Instructions (Addendum)
Please continue taking your prescribed medications as directed. Follow with your psychiatrist. Bonita Quin can go to the behavioral health urgent care walk-in clinic for any urgent psychiatric needs.

## 2020-10-28 DIAGNOSIS — Z23 Encounter for immunization: Secondary | ICD-10-CM | POA: Insufficient documentation

## 2020-10-28 DIAGNOSIS — W268XXA Contact with other sharp object(s), not elsewhere classified, initial encounter: Secondary | ICD-10-CM | POA: Insufficient documentation

## 2020-10-28 DIAGNOSIS — S50812A Abrasion of left forearm, initial encounter: Secondary | ICD-10-CM | POA: Insufficient documentation

## 2020-10-28 DIAGNOSIS — F17210 Nicotine dependence, cigarettes, uncomplicated: Secondary | ICD-10-CM | POA: Insufficient documentation

## 2020-10-29 ENCOUNTER — Emergency Department (HOSPITAL_COMMUNITY)
Admission: EM | Admit: 2020-10-29 | Discharge: 2020-10-29 | Disposition: A | Discharge status: Home / Self Care | Payer: Self-pay | Attending: Emergency Medicine | Admitting: Emergency Medicine

## 2020-10-29 ENCOUNTER — Encounter (HOSPITAL_COMMUNITY): Payer: Self-pay

## 2020-10-29 ENCOUNTER — Other Ambulatory Visit: Payer: Self-pay

## 2020-10-29 DIAGNOSIS — T148XXA Other injury of unspecified body region, initial encounter: Secondary | ICD-10-CM

## 2020-10-29 MED ORDER — TETANUS-DIPHTH-ACELL PERTUSSIS 5-2.5-18.5 LF-MCG/0.5 IM SUSY
0.5000 mL | PREFILLED_SYRINGE | Freq: Once | INTRAMUSCULAR | Status: AC
  Administered 2020-10-29: 0.5 mL via INTRAMUSCULAR
  Filled 2020-10-29: qty 0.5

## 2020-10-29 MED ORDER — BACITRACIN ZINC 500 UNIT/GM EX OINT
TOPICAL_OINTMENT | Freq: Once | CUTANEOUS | Status: AC
  Administered 2020-10-29: 1 via TOPICAL
  Filled 2020-10-29: qty 1.8

## 2020-10-29 NOTE — Discharge Instructions (Signed)
Keep abrasion clean dry and covered, you can use antibiotic ointment.  If you notice surrounding redness, swelling, increasing pain or drainage, these are signs of infection and you should return for reevaluation.

## 2020-10-29 NOTE — ED Triage Notes (Signed)
Pt sts he scrapped left arm on some concrete and wants someone to put a bandage on it.

## 2020-10-29 NOTE — ED Provider Notes (Signed)
[facility]-EMERGENCY DEPT Provider Note   CSN: [id number] Arrival date & time: 10/28/20  2359     History Chief Complaint  Patient presents with  . Wound Check    Cole Barrett is a 34 y.o. male.  Cole Barrett is a 34 y.o. male with history of homelessness, schizophrenia, and numerous ED visits, who presents to the emergency department for evaluation of abrasion to his left forearm.  He states he was moving some boxes and scraped his arm on concrete and wanted someone to take a look at it and bandage it.  He has not been able to wash it off.  He reports minimal pain, no swelling, it is not bleeding.  Denies any bony pain to the arm or difficulty moving the arm or hand.  Does not know when his last tetanus vaccine was.        Past Medical History:  Diagnosis Date  . Homelessness   . Hypercholesterolemia   . Mental disorder   . Paranoid behavior (HCC)   . PE (pulmonary embolism)   . Schizophrenia [facility])     Patient Active Problem List   Diagnosis Date Noted  . Acute psychosis (HCC) 08/31/2019  . Schizophrenia (HCC) 08/31/2019  . Schizoaffective disorder, bipolar type (HCC) 01/19/2019  . Alcohol abuse   . Auditory hallucinations   . Alcohol abuse with alcohol-induced mood disorder (HCC) 09/08/2016  . Homelessness 09/03/2016  . Person feigning illness 09/03/2016  . 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 Cannabis use disorder, mild, abuse 08/29/2016  . Pulmonary emboli (HCC) 07/13/2016  . Adjustment disorder with emotional disturbance 03/12/2014  . 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 Acute left flank pain 01/02/2014  . Pulmonary embolism, bilateral (HCC) 01/02/2014    History reviewed. No pertinent surgical history.     Family History  Problem Relation Age of Onset  . Hypertension Mother     Social History   Tobacco Use  . Smoking status: Current Every Day Smoker    Packs/day: 0.50     Types: Cigarettes  . Smokeless tobacco: Never Used  Vaping Use  . Vaping Use: Never used  Substance Use Topics  . Alcohol use: Yes    Comment: BAC not available  . Drug use: Yes    Types: Marijuana    Comment: Last used: 2 months ago UDS NA    Home Medications Prior to Admission medications   Medication Sig Start Date End Date Taking? Authorizing Provider  OLANZapine (ZYPREXA) 2.5 MG tablet Take 1 tablet (2.5 mg total) by mouth at bedtime. Patient not taking: Reported on 10/17/2020 09/30/20   Gailen Shelter, PA  risperiDONE (RISPERDAL) 1 MG tablet Take 1 tablet by mouth each morning.  Take 3 tablets by mouth at bedtime. Patient taking differently: Take 1 mg by mouth at bedtime. . 09/30/20   Gailen Shelter, PA  amantadine (SYMMETREL) 100 MG capsule Take 1 capsule (100 mg total) by mouth 2 (two) times daily. Patient not taking: Reported on 11/04/2019 09/01/19 11/17/19  [name], NP    Allergies    Haldol [haloperidol]  Review of Systems   Review of Systems  Constitutional: Negative for chills and fever.  Musculoskeletal: Negative for arthralgias, joint swelling and myalgias.  Skin: Positive for wound.  Neurological: Negative for weakness and numbness.    Physical Exam Updated Vital Signs BP 132/75 (BP Location: Right Arm)   Pulse 81   Temp 97.7 F (36.5 C) (Oral)  Resp 16   Ht 5\' 10"  (1.778 m)   Wt 102.1 kg   SpO2 99%   BMI 32.28 kg/m   Physical Exam Vitals and nursing note reviewed.  Constitutional:      General: He is not in acute distress.    Appearance: Normal appearance. He is well-developed. He is not ill-appearing or diaphoretic.  HENT:     Head: Normocephalic and atraumatic.  Eyes:     General:        Right eye: No discharge.        Left eye: No discharge.  Pulmonary:     Effort: Pulmonary effort is normal. No respiratory distress.  Musculoskeletal:     Comments: 2 small areas of superficial abrasion to the dorsum of the left forearm, no  bleeding, no purulent drainage, no erythema or swelling, distal pulses 2+, normal sensation and strength distal from injury, no deformity or bony tenderness  Skin:    General: Skin is warm and dry.  Neurological:     Mental Status: He is alert.     Coordination: Coordination normal.  Psychiatric:        Behavior: Behavior normal.     ED Results / Procedures / Treatments   Labs (all labs ordered are listed, but only abnormal results are displayed) Labs Reviewed - No data to display  EKG None  Radiology No results found.  Procedures Procedures (including critical care time)  Medications Ordered in ED Medications  Tdap (BOOSTRIX) injection 0.5 mL (0.5 mLs Intramuscular Given 10/29/20 0203)  bacitracin ointment (1 application Topical Given 10/29/20 0206)    ED Course  I have reviewed the triage vital signs and the nursing notes.  Pertinent labs & imaging results that were available during my care of the patient were reviewed by me and considered in my medical decision making (see chart for details).    MDM Rules/Calculators/A&P                         34 year old male presents for abrasion to the left forearm after he scraped it on concrete earlier today, he has a very superficial wound with no bleeding or signs of infection, no bony tenderness, neurovascularly intact.  Unsure of last tetanus vaccination so this was updated, the abrasion was cleaned, bacitracin and dressing applied.  Discussed infection precautions that should warrant return for reevaluation.  Patient discharged home in good condition.   Final Clinical Impression(s) / ED Diagnoses Final diagnoses:  Skin abrasion    Rx / DC Orders ED Discharge Orders    None       31 10/29/20 14/02/21, MD 10/29/20 [phone]

## 2020-10-31 ENCOUNTER — Encounter (HOSPITAL_COMMUNITY): Payer: Self-pay | Admitting: Emergency Medicine

## 2020-10-31 ENCOUNTER — Other Ambulatory Visit: Payer: Self-pay

## 2020-10-31 ENCOUNTER — Emergency Department (HOSPITAL_COMMUNITY)
Admission: EM | Admit: 2020-10-31 | Discharge: 2020-11-01 | Disposition: A | Discharge status: Home / Self Care | Payer: Self-pay | Attending: Emergency Medicine | Admitting: Emergency Medicine

## 2020-10-31 DIAGNOSIS — F209 Schizophrenia, unspecified: Secondary | ICD-10-CM | POA: Insufficient documentation

## 2020-10-31 DIAGNOSIS — F17210 Nicotine dependence, cigarettes, uncomplicated: Secondary | ICD-10-CM | POA: Insufficient documentation

## 2020-10-31 DIAGNOSIS — Z760 Encounter for issue of repeat prescription: Secondary | ICD-10-CM | POA: Insufficient documentation

## 2020-10-31 DIAGNOSIS — Z79899 Other long term (current) drug therapy: Secondary | ICD-10-CM | POA: Insufficient documentation

## 2020-10-31 NOTE — ED Triage Notes (Signed)
Patient requesting prescription refill of his Risperidal.

## 2020-11-01 MED ORDER — RISPERIDONE 1 MG PO TABS
1.0000 mg | ORAL_TABLET | Freq: Once | ORAL | Status: AC
  Administered 2020-11-01: 1 mg via ORAL
  Filled 2020-11-01: qty 1

## 2020-11-01 MED ORDER — RISPERIDONE 1 MG PO TABS: 1.0000 mg | ORAL_TABLET | Freq: Every day | ORAL | 0 refills | Status: DC

## 2020-11-01 NOTE — Discharge Instructions (Signed)
Refill sent to your pharmacy, please pick it up today. Follow-up with [facility] on Monday. Return here for new concerns.

## 2020-11-01 NOTE — ED Provider Notes (Signed)
[name] EMERGENCY DEPARTMENT Provider Note   CSN: [id number] Arrival date & time: 10/31/20  2319     History Chief Complaint  Patient presents with  . Medication Refill    Elmer Merwin is a 34 y.o. male.  The history is provided by the patient and medical records.  Medication Refill   34 year old male with history of schizophrenia, homelessness, hyperlipidemia, presenting to the ED requesting refill of his Risperdal.  States he lost his bottle of pills a few days ago.  States he last had Risperdal while in the ED 2 nights ago.  States "I need to get back on it before I start acting crazy".  Denies SI/HI/AVH at this time.  States he plans to go to [employer] on Monday.  Past Medical History:  Diagnosis Date  . Homelessness   . Hypercholesterolemia   . Mental disorder   . Paranoid behavior (HCC)   . PE (pulmonary embolism)   . Schizophrenia [facility])     Patient Active Problem List   Diagnosis Date Noted  . Acute psychosis (HCC) 08/31/2019  . Schizophrenia (HCC) 08/31/2019  . Schizoaffective disorder, bipolar type (HCC) 01/19/2019  . Alcohol abuse   . Auditory hallucinations   . Alcohol abuse with alcohol-induced mood disorder (HCC) 09/08/2016  . Homelessness 09/03/2016  . Person feigning illness 09/03/2016  . 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 Cannabis use disorder, mild, abuse 08/29/2016  . Pulmonary emboli (HCC) 07/13/2016  . Adjustment disorder with emotional disturbance 03/12/2014  . 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 Acute left flank pain 01/02/2014  . Pulmonary embolism, bilateral (HCC) 01/02/2014    History reviewed. No pertinent surgical history.     Family History  Problem Relation Age of Onset  . Hypertension Mother     Social History   Tobacco Use  . Smoking status: Current Every Day Smoker    Packs/day: 0.50    Types: Cigarettes  . Smokeless tobacco: Never Used  Vaping Use  . Vaping  Use: Never used  Substance Use Topics  . Alcohol use: Yes    Comment: BAC not available  . Drug use: Yes    Types: Marijuana    Comment: Last used: 2 months ago UDS NA    Home Medications Prior to Admission medications   Medication Sig Start Date End Date Taking? Authorizing Provider  OLANZapine (ZYPREXA) 2.5 MG tablet Take 1 tablet (2.5 mg total) by mouth at bedtime. Patient not taking: Reported on 10/17/2020 09/30/20   Gailen Shelter, PA  risperiDONE (RISPERDAL) 1 MG tablet Take 1 tablet by mouth each morning.  Take 3 tablets by mouth at bedtime. Patient taking differently: Take 1 mg by mouth at bedtime. . 09/30/20   Gailen Shelter, PA  amantadine (SYMMETREL) 100 MG capsule Take 1 capsule (100 mg total) by mouth 2 (two) times daily. Patient not taking: Reported on 11/04/2019 09/01/19 11/17/19  [name], NP    Allergies    Haldol [haloperidol]  Review of Systems   Review of Systems  Constitutional:       Med refill  All other systems reviewed and are negative.   Physical Exam Updated Vital Signs BP 110/76   Pulse 68   Temp 98.1 F (36.7 C) (Oral)   Resp 16   SpO2 100%   Physical Exam Vitals and nursing note reviewed.  Constitutional:      Appearance: He is well-developed.     Comments: Disheveled but appears  at baseline  HENT:     Head: Normocephalic and atraumatic.  Eyes:     Conjunctiva/sclera: Conjunctivae normal.     Pupils: Pupils are equal, round, and reactive to light.  Cardiovascular:     Rate and Rhythm: Normal rate and regular rhythm.     Heart sounds: Normal heart sounds.  Pulmonary:     Effort: Pulmonary effort is normal.     Breath sounds: Normal breath sounds.  Abdominal:     General: Bowel sounds are normal.     Palpations: Abdomen is soft.  Musculoskeletal:        General: Normal range of motion.     Cervical back: Normal range of motion.  Skin:    General: Skin is warm and dry.  Neurological:     Mental Status: He is alert and  oriented to person, place, and time.  Psychiatric:     Comments: Denies SI/HI/AVH     ED Results / Procedures / Treatments   Labs (all labs ordered are listed, but only abnormal results are displayed) Labs Reviewed - No data to display  EKG None  Radiology No results found.  Procedures Procedures (including critical care time)  Medications Ordered in ED Medications  risperiDONE (RISPERDAL) tablet 1 mg (has no administration in time range)    ED Course  I have reviewed the triage vital signs and the nursing notes.  Pertinent labs & imaging results that were available during my care of the patient were reviewed by me and considered in my medical decision making (see chart for details).    MDM Rules/Calculators/A&P  34 year old male presenting to the ED requesting refill of his Resporal.  States he lost his bottle of pills.  No medications in 2 days.  He is awake, alert, oriented.  He appears at his baseline.  Denies active SI/HI/AVH today.  Given dose of risperdal here and short refill sent to pharmacy.  States he will follow-up with [facility] on Monday.  Return here for new concerns.  Final Clinical Impression(s) / ED Diagnoses Final diagnoses:  Medication refill    Rx / DC Orders ED Discharge Orders         Ordered    risperiDONE (RISPERDAL) 1 MG tablet  Daily at bedtime        11/01/20 0544           Garlon Hatchet, PA-C 11/01/20 0553    [name], MD 11/01/20 [phone]

## 2020-11-05 ENCOUNTER — Other Ambulatory Visit: Payer: Self-pay

## 2020-11-05 ENCOUNTER — Emergency Department (HOSPITAL_COMMUNITY)
Admission: EM | Admit: 2020-11-05 | Discharge: 2020-11-05 | Disposition: A | Discharge status: Home / Self Care | Payer: Self-pay | Attending: Emergency Medicine | Admitting: Emergency Medicine

## 2020-11-05 DIAGNOSIS — F17210 Nicotine dependence, cigarettes, uncomplicated: Secondary | ICD-10-CM | POA: Insufficient documentation

## 2020-11-05 DIAGNOSIS — R440 Auditory hallucinations: Secondary | ICD-10-CM | POA: Insufficient documentation

## 2020-11-05 NOTE — ED Provider Notes (Signed)
TIME SEEN: 4:35 AM  CHIEF COMPLAINT: Auditory hallucinations  HPI: Patient is a 34 year old male with history of schizophrenia who presents to the emergency department for auditory hallucinations.  Frequently presents for the same when he states his auditory hallucinations are "overwhelming".  No current SI, HI but states he is hoping to get help with behavioral health and get placement.  This is a normal presentation for patient who is well-known to our emergency department.  Was last evaluated by behavioral health on October 24, 2020 for the exact same presentation.  At that time did not meet criteria for inpatient psychiatric treatment.  ROS: See HPI Constitutional: no fever  Eyes: no drainage  ENT: no runny nose   Cardiovascular:  no chest pain  Resp: no SOB  GI: no vomiting GU: no dysuria Integumentary: no rash  Allergy: no hives  Musculoskeletal: no leg swelling  Neurological: no slurred speech ROS otherwise negative  PAST MEDICAL HISTORY/PAST SURGICAL HISTORY:  Past Medical History:  Diagnosis Date  . Homelessness   . Hypercholesterolemia   . Mental disorder   . Paranoid behavior (HCC)   . PE (pulmonary embolism)   . Schizophrenia (HCC)     MEDICATIONS:  Prior to Admission medications   Medication Sig Start Date End Date Taking? Authorizing Provider  OLANZapine (ZYPREXA) 2.5 MG tablet Take 1 tablet (2.5 mg total) by mouth at bedtime. Patient not taking: Reported on 10/17/2020 09/30/20   Gailen Shelter, PA  risperiDONE (RISPERDAL) 1 MG tablet Take 1 tablet (1 mg total) by mouth at bedtime. 11/01/20   Garlon Hatchet, PA-C  amantadine (SYMMETREL) 100 MG capsule Take 1 capsule (100 mg total) by mouth 2 (two) times daily. Patient not taking: Reported on 11/04/2019 09/01/19 11/17/19  [name], NP    ALLERGIES:  Allergies  Allergen Reactions  . Haldol [Haloperidol] Shortness Of Breath    SOCIAL HISTORY:  Social History   Tobacco Use  . Smoking status: Current  Every Day Smoker    Packs/day: 0.50    Types: Cigarettes  . Smokeless tobacco: Never Used  Substance Use Topics  . Alcohol use: Yes    Comment: BAC not available    FAMILY HISTORY: Family History  Problem Relation Age of Onset  . Hypertension Mother     EXAM: BP 113/69 (BP Location: Left Arm)   Pulse 63   Temp 97.7 F (36.5 C) (Oral)   Resp 16   SpO2 99%  CONSTITUTIONAL: Alert and responds appropriately to questions.  Chronically ill-appearing. HEAD: Normocephalic, atraumatic EYES: Conjunctivae clear, pupils appear equal ENT: normal nose; moist mucous membranes NECK: Normal range of motion CARD: Regular rate and rhythm RESP: Normal chest excursion without splinting or tachypnea; no hypoxia or respiratory distress, speaking full sentences ABD/GI: non-distended EXT: Normal ROM in all joints, no major deformities noted SKIN: Normal color for age and race, no rashes on exposed skin NEURO: Moves all extremities equally, normal speech, no facial asymmetry noted PSYCH: The patient's mood and manner are appropriate.  At this time does not appear to be responding to internal stimuli.  No SI.  MEDICAL DECISION MAKING: Patient here with chronic auditory hallucinations.  Presents to the emergency department frequently for the same with the exact same presentation.  Last seen by behavioral health on 10/24/2020 at that time did not meet criteria for inpatient treatment.  I do not feel he needs emergent TTS evaluation or placement at this time.  Provided with outpatient resources.  He is agreeable  to this plan.  At this time, I do not feel there is any life-threatening condition present. I have reviewed, interpreted and discussed all results (EKG, imaging, lab, urine as appropriate) and exam findings with patient/family. I have reviewed nursing notes and appropriate previous records.  I feel the patient is safe to be discharged home without further emergent workup and can continue workup as an  outpatient as needed. Discussed usual and customary return precautions. Patient/family verbalize understanding and are comfortable with this plan.  Outpatient follow-up has been provided as needed. All questions have been answered.   Davon Abdelaziz was evaluated in Emergency Department on 11/05/2020 for the symptoms described in the history of present illness. He was evaluated in the context of the global COVID-19 pandemic, which necessitated consideration that the patient might be at risk for infection with the SARS-CoV-2 virus that causes COVID-19. Institutional protocols and algorithms that pertain to the evaluation of patients at risk for COVID-19 are in a state of rapid change based on information released by regulatory bodies including the CDC and federal and state organizations. These policies and algorithms were followed during the patient's care in the ED.      Ellison Leisure, Layla Maw, DO 11/05/20 [phone]

## 2020-11-06 ENCOUNTER — Encounter (HOSPITAL_COMMUNITY): Payer: Self-pay

## 2020-11-06 ENCOUNTER — Other Ambulatory Visit: Payer: Self-pay

## 2020-11-06 ENCOUNTER — Emergency Department (HOSPITAL_COMMUNITY)
Admission: EM | Admit: 2020-11-06 | Discharge: 2020-11-07 | Disposition: A | Discharge status: Home / Self Care | Payer: Self-pay | Attending: Emergency Medicine | Admitting: Emergency Medicine

## 2020-11-06 DIAGNOSIS — F17210 Nicotine dependence, cigarettes, uncomplicated: Secondary | ICD-10-CM | POA: Insufficient documentation

## 2020-11-06 DIAGNOSIS — I10 Essential (primary) hypertension: Secondary | ICD-10-CM | POA: Insufficient documentation

## 2020-11-06 DIAGNOSIS — F209 Schizophrenia, unspecified: Secondary | ICD-10-CM | POA: Insufficient documentation

## 2020-11-06 DIAGNOSIS — R440 Auditory hallucinations: Secondary | ICD-10-CM

## 2020-11-06 DIAGNOSIS — Z20822 Contact with and (suspected) exposure to covid-19: Secondary | ICD-10-CM | POA: Insufficient documentation

## 2020-11-06 NOTE — ED Triage Notes (Signed)
Pt reports auditory hallucinations stating they were going to kill him. Pt requesting to be sent to behavioral health.

## 2020-11-07 ENCOUNTER — Ambulatory Visit (HOSPITAL_COMMUNITY)
Admission: EM | Admit: 2020-11-07 | Discharge: 2020-11-07 | Disposition: A | Discharge status: Home / Self Care | Payer: No Payment, Other | Attending: Family | Admitting: Family

## 2020-11-07 ENCOUNTER — Other Ambulatory Visit: Payer: Self-pay

## 2020-11-07 DIAGNOSIS — R440 Auditory hallucinations: Secondary | ICD-10-CM

## 2020-11-07 LAB — RESP PANEL BY RT-PCR (FLU A&B, COVID) ARPGX2
Influenza A by PCR: NEGATIVE
Influenza B by PCR: NEGATIVE
SARS Coronavirus 2 by RT PCR: NEGATIVE

## 2020-11-07 MED ORDER — RISPERDAL 1 MG PO TABS: 1.0000 mg | ORAL_TABLET | Freq: Every day | ORAL | 0 refills | Status: DC

## 2020-11-07 NOTE — ED Provider Notes (Signed)
[facility]-EMERGENCY DEPT Provider Note: Cole Dell, MD, FACEP  CSN: [id number] MRN: [id number] ARRIVAL: 11/06/20 at 2219 ROOM: [facility]   CHIEF COMPLAINT  Psychiatric Evaluation   HISTORY OF PRESENT ILLNESS  11/07/20 1:12 AM Cole Barrett is a 34 y.o. male with a history of schizophrenia and frequent visits to the ED for same. He is here stating he has been hearing voices for about the past 5 h. This started while he was watching TV. The voices are threatening to kill him although they are not giving a reason they want to kill him. He states he would like to get into behavioral health for treatment. He denies any acute somatic complaint.   Past Medical History:  Diagnosis Date  . Homelessness   . Hypercholesterolemia   . Mental disorder   . Paranoid behavior (HCC)   . PE (pulmonary embolism)   . Schizophrenia (HCC)     History reviewed. No pertinent surgical history.  Family History  Problem Relation Age of Onset  . Hypertension Mother     Social History   Tobacco Use  . Smoking status: Current Every Day Smoker    Packs/day: 0.50    Types: Cigarettes  . Smokeless tobacco: Never Used  Vaping Use  . Vaping Use: Never used  Substance Use Topics  . Alcohol use: Yes    Comment: BAC not available  . Drug use: Yes    Types: Marijuana    Comment: Last used: 2 months ago UDS NA    Prior to Admission medications   Medication Sig Start Date End Date Taking? Authorizing Provider  OLANZapine (ZYPREXA) 2.5 MG tablet Take 1 tablet (2.5 mg total) by mouth at bedtime. Patient not taking: Reported on 10/17/2020 09/30/20   Gailen Shelter, PA  risperiDONE (RISPERDAL) 1 MG tablet Take 1 tablet (1 mg total) by mouth at bedtime. 11/01/20   Garlon Hatchet, PA-C  amantadine (SYMMETREL) 100 MG capsule Take 1 capsule (100 mg total) by mouth 2 (two) times daily. Patient not taking: Reported on 11/04/2019 09/01/19 11/17/19  [name], NP    Allergies Haldol  [haloperidol]   REVIEW OF SYSTEMS  Negative except as noted here or in the History of Present Illness.   PHYSICAL EXAMINATION  Initial Vital Signs Blood pressure 132/72, pulse 75, temperature 98.3 F (36.8 C), temperature source Oral, resp. rate 17, height 5\' 10"  (1.778 m), weight 102 kg, SpO2 99 %.  Examination General: Well-developed, well-nourished male in no acute distress; appearance consistent with age of record HENT: normocephalic; atraumatic Eyes: pupils equal, round and reactive to light; extraocular muscles intact Neck: supple Heart: regular rate and rhythm Lungs: clear to auscultation bilaterally Abdomen: soft; nondistended; nontender; bowel sounds present Extremities: No deformity; full range of motion; pulses normal Neurologic: Awake, alert; motor function intact in all extremities and symmetric; no facial droop Skin: Warm and dry Psychiatric: Auditory hallucinations   RESULTS  Summary of this visit's results, reviewed and interpreted by myself:   EKG Interpretation  Date/Time:    Ventricular Rate:    PR Interval:    QRS Duration:   QT Interval:    QTC Calculation:   R Axis:     Text Interpretation:        Laboratory Studies: Results for orders placed or performed during the hospital encounter of 11/06/20 (from the past 24 hour(s))  Resp Panel by RT-PCR (Flu A&B, Covid) Nasopharyngeal Swab     Status: None   Collection Time: 11/07/20  2:13 AM   Specimen: Nasopharyngeal Swab; Nasopharyngeal(NP) swabs in vial transport medium  Result Value Ref Range   SARS Coronavirus 2 by RT PCR NEGATIVE NEGATIVE   Influenza A by PCR NEGATIVE NEGATIVE   Influenza B by PCR NEGATIVE NEGATIVE   Imaging Studies: No results found.  ED COURSE and MDM  Nursing notes, initial and subsequent vitals signs, including pulse oximetry, reviewed and interpreted by myself.  Vitals:   11/06/20 2243 11/06/20 2244  BP:  132/72  Pulse:  75  Resp:  17  Temp:  98.3 F (36.8 C)   TempSrc:  Oral  SpO2:  99%  Weight: 102 kg   Height: 5\' 10"  (1.778 m)    Medications - No data to display  We'll have TTS assess him. He is frequently here and rarely meets inpatient criteria.  PROCEDURES  Procedures   ED DIAGNOSES     ICD-10-CM   1. Auditory hallucinations  R44.0        [name], , MD 11/07/20 [phone]
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
## 2020-11-07 NOTE — BH Assessment (Signed)
Tele Assessment Note   Patient Name: Cole Barrett MRN: [id number] Referring Physician: Paula Libra, MD Location of Patient: [facility] ED, [phone] Location of Provider: Behavioral Health TTS Department  Correy Weidner is an 34 y.o. single male who presents unaccompanied to [facility] ED reporting auditory hallucinations. Pt has a diagnosis of schizophrenia and says he woke up tonight and voices telling him they are going to kill him. Pt has presented to the ED 11 times in the past month reporting similar symptoms. Pt denies visual hallucinations. He describes his mood as "okay" and denies depressive symptoms. He denies problems with sleep or appetite. He denies current suicidal ideation or history of suicide attempts. He denies current homicidal ideation or history of violence. He denies alcohol or other substance use.  Pt cannot identify any stressors other than auditory hallucinations. He says he lives alone in a house, that he is not homeless. 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 He says he does not have any family or friends who are supportive and does not like to be around people. He denies any legal problems. He denies history of abuse. He denies access to firearms. Pt says he take Risperdal as prescribed. He says he has been a Pt at [facility] in the past but has not seen their provider in months. Pt has been referred to [facility] but has not presented for treatment. Pt reports he has been psychiatrically hospitalized in the past, most recently at [facility] in October 2020.  Pt does not identify anyone to contact for collateral information.  Pt is dressed in dirty clothing, alert and oriented x4. Pt speaks in a clear tone, at moderate volume and normal pace. Motor behavior appears normal. Eye contact is good. Pt's mood is euthymic and affect is congruent with mood. Thought process is coherent and relevant. There is no indication from Pt's  behavior that he is currently responding to internal stimuli or experiencing delusional thought content. Pt was pleasant and cooperative throughout assessment.   Diagnosis: F20.9 Schizophrenia  Past Medical History:  Past Medical History:  Diagnosis Date  . Homelessness   . Hypercholesterolemia   . Mental disorder   . Paranoid behavior (HCC)   . PE (pulmonary embolism)   . Schizophrenia (HCC)     History reviewed. No pertinent surgical history.  Family History:  Family History  Problem Relation Age of Onset  . Hypertension Mother     Social History:  reports that he has been smoking cigarettes. He has been smoking about 0.50 packs per day. He has never used smokeless tobacco. He reports current alcohol use. He reports current drug use. Drug: Marijuana.  Additional Social History:  Alcohol / Drug Use Pain Medications: See MAR Prescriptions: See MAR Over the Counter: See MAR History of alcohol / drug use?: No history of alcohol / drug abuse Longest period of sobriety (when/how long): N/A  CIWA: CIWA-Ar BP: 132/72 Pulse Rate: 75 COWS:    Allergies:  Allergies  Allergen Reactions  . Haldol [Haloperidol] Shortness Of Breath    Home Medications: (Not in a hospital admission)   OB/GYN Status:  No LMP for male patient.  General Assessment Data Location of Assessment: [facility] ED TTS Assessment: In system Is this a Tele or Face-to-Face Assessment?: Tele Assessment Is this an Initial Assessment or a Re-assessment for this encounter?: Initial Assessment Patient Accompanied by:: N/A Language Other than English: No Living Arrangements: Other (Comment) (Lives in a house)  What gender do you identify as?: Male Date Telepsych consult ordered in [facility]: 11/07/20 Time Telepsych consult ordered in [facility]: 0113 Marital status: Single Maiden name: NA Pregnancy Status: No Living Arrangements: Alone Can pt return to current living arrangement?: Yes Admission Status: Voluntary Is patient  capable of signing voluntary admission?: Yes Referral Source: Self/Family/Friend Insurance type: Self-pay     Crisis Care Plan Living Arrangements: Alone Legal Guardian: Other: (Self) Name of Psychiatrist: [facility] Name of Therapist: None  Education Status Is patient currently in school?: No Is the patient employed, unemployed or receiving disability?: Unemployed  Risk to self with the past 6 months Suicidal Ideation: No Has patient been a risk to self within the past 6 months prior to admission? : No Suicidal Intent: No Has patient had any suicidal intent within the past 6 months prior to admission? : No Is patient at risk for suicide?: No Suicidal Plan?: No Has patient had any suicidal plan within the past 6 months prior to admission? : No Access to Means: No What has been your use of drugs/alcohol within the last 12 months?: Pt denies Previous Attempts/Gestures: No How many times?: 0 Other Self Harm Risks: None Triggers for Past Attempts: None known Intentional Self Injurious Behavior: None Family Suicide History: No Recent stressful life event(s): Other (Comment) (None identified) Persecutory voices/beliefs?: Yes Depression: No Depression Symptoms:  (Pt denies depressive symptoms) Substance abuse history and/or treatment for substance abuse?: No Suicide prevention information given to non-admitted patients: Not applicable  Risk to Others within the past 6 months Homicidal Ideation: No Does patient have any lifetime risk of violence toward others beyond the six months prior to admission? : No Thoughts of Harm to Others: No Current Homicidal Intent: No Current Homicidal Plan: No Access to Homicidal Means: No Identified Victim: None History of harm to others?: No Assessment of Violence: None Noted Violent Behavior Description: Pt denies history of violence Does patient have access to weapons?: No Criminal Charges Pending?: No Does patient have a court date: No Is  patient on probation?: No  Psychosis Hallucinations: Auditory (Pt reports he hears voices telling him they are going to kill him) Delusions: None noted  Mental Status Report Appearance/Hygiene: Disheveled Eye Contact: Good Motor Activity: Freedom of movement Speech: Logical/coherent Level of Consciousness: Alert Mood: Anxious Affect: Anxious Anxiety Level: Minimal Thought Processes: Coherent,Relevant Judgement: Partial Orientation: Person,Place,Time,Situation Obsessive Compulsive Thoughts/Behaviors: None  Cognitive Functioning Concentration: Normal Memory: Recent Intact,Remote Intact Is patient IDD: No Insight: Poor Impulse Control: Fair Appetite: Good Have you had any weight changes? : No Change Sleep: No Change Total Hours of Sleep: 8 Vegetative Symptoms: None  ADLScreening [facility] Assessment Services) Patient's cognitive ability adequate to safely complete daily activities?: Yes Patient able to express need for assistance with ADLs?: Yes Independently performs ADLs?: Yes (appropriate for developmental age)  Prior Inpatient Therapy Prior Inpatient Therapy: Yes Prior Therapy Dates: 08/2019, multiple admits Prior Therapy Facilty/Provider(s): [facility], other facilities Reason for Treatment: Schizoaffective disorder  Prior Outpatient Therapy Prior Outpatient Therapy: Yes Prior Therapy Dates: Current Prior Therapy Facilty/Provider(s): [facility] Reason for Treatment: Schizoaffective disorder Does patient have an ACCT team?: No Does patient have Intensive In-House Services?  : No Does patient have [facility] services? : Yes Does patient have P4CC services?: No  ADL Screening (condition at time of admission) Patient's cognitive ability adequate to safely complete daily activities?: Yes Is the patient deaf or have difficulty hearing?: No Does the patient have difficulty seeing, even when wearing glasses/contacts?: No Does the patient  have difficulty concentrating,  remembering, or making decisions?: No Patient able to express need for assistance with ADLs?: Yes Independently performs ADLs?: Yes (appropriate for developmental age) Does the patient have difficulty walking or climbing stairs?: No Weakness of Legs: None Weakness of Arms/Hands: None  Home Assistive Devices/Equipment Home Assistive Devices/Equipment: None    Abuse/Neglect Assessment (Assessment to be complete while patient is alone) Abuse/Neglect Assessment Can Be Completed: Yes Physical Abuse: Denies Verbal Abuse: Denies Sexual Abuse: Denies Exploitation of patient/patient's resources: Denies Self-Neglect: Denies     Merchant navy officer (For Healthcare) Does Patient Have a Medical Advance Directive?: No Would patient like information on creating a medical advance directive?: No - Patient declined          Disposition: Gave clinical report to Otila Back, PA-C who recommends Pt be transferred to [facility] for continuous assessment. Notified Dr. Paula Libra and Adora Fridge, RN of recommendation. Notified [facility] staff of pending transfer.   Disposition Initial Assessment Completed for this Encounter: Yes Patient referred to: Other (Comment) ([facility])  This service was provided via telemedicine using a 2-way, interactive audio and video technology.  Names of all persons participating in this telemedicine service and their role in this encounter. Name: [name] Role: Patient  Name: Shela Commons, [facility] Role: TTS counselor         Harlin Rain Patsy [place], [facility], [facility] Triage Specialist [phone]  Patsy Baltimore, Harlin Rain 11/07/2020 2:15 AM

## 2020-11-07 NOTE — ED Triage Notes (Signed)
Continues to report auditory hallucinations.  Stated that the voices want to kill him.  Appears disorganized and guarded.  Patient is disheveled, very malodorous and clothing is worn and dirty.

## 2020-11-07 NOTE — ED Notes (Signed)
Pt alert and oriented on the unit. Education, support, and encouragement provided. Discharge summary, medications and follow up appointments reviewed with pt. Suicide prevention resources provided. Pt's belongings in locker returned. Pt denies SI/HI, A/VH, pain, or any concerns at this time. Pt ambulatory on and off unit. Pt discharged to lobby. 

## 2020-11-07 NOTE — ED Notes (Signed)
Pt escorted to safe transport and escorted to [facility].

## 2020-11-07 NOTE — Discharge Instructions (Signed)
Take all medications as prescribed. Keep all follow-up appointments as scheduled.  Do not consume alcohol or use illegal drugs while on prescription medications. Report any adverse effects from your medications to your primary care provider promptly.  In the event of recurrent symptoms or worsening symptoms, call 911, a crisis hotline, or go to the nearest emergency department for evaluation.   

## 2020-11-07 NOTE — ED Provider Notes (Addendum)
FBC/OBS ASAP Discharge Summary  Date and Time: 11/07/2020 10:03 AM  Name: Cole Barrett  MRN:  [id number]   Discharge Diagnoses:  Final diagnoses:  Auditory hallucination    Subjective: Cole Barrett 34 year old male well-known to this service, continues to endorse auditory hallucinations. Chart reviewed hallucinations are chronic in nature. Patient denied that he is taken medications for the voices.  Cole Barrett reported " I slept good " he  denied suicidal or homicidal ideations.  Patient reports " sometimes the voices tell me they are going to kill me."  Currently denying plan or intent.  Patient is requesting to bus pass at discharge.  States he was prescribed Risperdal in the past which help with the voices. NP will prdx Risperdal 1 mg with 20 tablets.  Patient was encouraged to keep follow-up appointment with walk-in behavioral health clinic.  He was receptive to plan.  Case staffed with attending psychiatrist Akintayo  Support, encouragement reassurance was provided.  Per admission assessment note: Cole Barrett is an 34 y.o. single male who presents unaccompanied to [facility] ED reporting auditory hallucinations. Pt has a diagnosis of schizophrenia and says he woke up tonight and voices telling him they are going to kill him. Pt has presented to the ED 11 times in the past month reporting similar symptoms. Pt denies visual hallucinations. He describes his mood as "okay" and denies depressive symptoms. He denies problems with sleep or appetite. He denies current suicidal ideation or history of suicide attempts. He denies current homicidal ideation or history of violence. He denies alcohol or other substance use.  Total Time spent with patient: 15 minutes  Past Psychiatric History:  Past Medical History:  Past Medical History:  Diagnosis Date  . Homelessness   . Hypercholesterolemia   . Mental disorder   . Paranoid behavior (HCC)   . PE (pulmonary embolism)    . Schizophrenia (HCC)    No past surgical history on file. Family History:  Family History  Problem Relation Age of Onset  . Hypertension Mother    Family Psychiatric History:  Social History:  Social History   Substance and Sexual Activity  Alcohol Use Yes   Comment: BAC not available     Social History   Substance and Sexual Activity  Drug Use Yes  . Types: Marijuana   Comment: Last used: 2 months ago UDS NA    Social History   Socioeconomic History  . Marital status: Single    Spouse name: Not on file  . Number of children: Not on file  . Years of education: Not on file  . Highest education level: Not on file  Occupational History  . Occupation: Employed    Comment: [employer]  Tobacco Use  . Smoking status: Current Every Day Smoker    Packs/day: 0.50    Types: Cigarettes  . Smokeless tobacco: Never Used  Vaping Use  . Vaping Use: Never used  Substance and Sexual Activity  . Alcohol use: Yes    Comment: BAC not available  . Drug use: Yes    Types: Marijuana    Comment: Last used: 2 months ago UDS NA  . Sexual activity: Never  Other Topics Concern  . Not on file  Social History Narrative   ** Merged History Encounter **    Pt lives alone in [place].  He is employed.  He stated that he is followed by [facility], but that he has not been there in a while.   Social Determinants  of Health   Financial Resource Strain: Not on file  Food Insecurity: Not on file  Transportation Needs: Not on file  Physical Activity: Not on file  Stress: Not on file  Social Connections: Not on file   SDOH:  SDOH Screenings   Alcohol Screen: Not on file  Depression (PHQ2-9): Low Risk   . PHQ-2 Score: 0  Financial Resource Strain: Not on file  Food Insecurity: Not on file  Housing: Not on file  Physical Activity: Not on file  Social Connections: Not on file  Stress: Not on file  Tobacco Use: High Risk  . Smoking Tobacco Use: Current Every Day Smoker  .  Smokeless Tobacco Use: Never Used  Transportation Needs: Not on file    Has this patient used any form of tobacco in the last 30 days? (Cigarettes, Smokeless Tobacco, Cigars, and/or Pipes) NO  Current Medications:  No current facility-administered medications for this encounter.   Current Outpatient Medications  Medication Sig Dispense Refill  . OLANZapine (ZYPREXA) 2.5 MG tablet Take 1 tablet (2.5 mg total) by mouth at bedtime. (Patient not taking: Reported on 10/17/2020) 10 tablet 0  . risperiDONE (RISPERDAL) 1 MG tablet Take 1 tablet (1 mg total) by mouth at bedtime. 20 tablet 0   Facility-Administered Medications Ordered in Other Encounters  Medication Dose Route Frequency Provider Last Rate Last Admin  . acetaminophen (TYLENOL) tablet 650 mg  650 mg Oral Q6H PRN Ophelia Shoulder E, NP      . alum & mag hydroxide-simeth (MAALOX/MYLANTA) 200-200-20 MG/5ML suspension 30 mL  30 mL Oral Q4H PRN Ophelia Shoulder E, NP      . magnesium hydroxide (MILK OF MAGNESIA) suspension 30 mL  30 mL Oral Daily PRN [name], NP        PTA Medications: (Not in a hospital admission)   Musculoskeletal  Strength & Muscle Tone: within normal limits Gait & Station: normal Patient leans: N/A  Psychiatric Specialty Exam  Presentation  General Appearance: Appropriate for Environment  Eye Contact:Good  Speech:Clear and Coherent  Speech Volume:Normal  Handedness:Right   Mood and Affect  Mood:Euthymic  Affect:Congruent   Thought Process  Thought Processes:Goal Directed; Coherent  Descriptions of Associations:Intact  Orientation:Full (Time, Place and Person)  Thought Content:Logical  Hallucinations:Hallucinations: Auditory Description of Auditory Hallucinations: "They are telling me they are going to kill me."  Ideas of Reference:None  Suicidal Thoughts:Suicidal Thoughts: No  Homicidal Thoughts:Homicidal Thoughts: No   Sensorium  Memory:Immediate Good; Recent Good; Remote  Good  Judgment:Good  Insight:Fair   Executive Functions  Concentration:Good  Attention Span:Good  Recall:Fair  Fund of Knowledge:Fair  Language:Fair   Psychomotor Activity  Psychomotor Activity:Psychomotor Activity: Normal   Assets  Assets:Housing; Social Support; Transportation   Sleep  Sleep:Sleep: Good   Physical Exam  Physical Exam ROS Blood pressure 115/77, pulse 72, temperature (!) 97.5 F (36.4 C), temperature source Temporal, resp. rate 20, height 5\' 10"  (1.778 m), weight 99.8 kg, SpO2 97 %. Body mass index is 31.57 kg/m.  Demographic Factors:  Male and Low socioeconomic status  Loss Factors: Decrease in vocational status and Financial problems/change in socioeconomic status  Historical Factors: Family history of mental illness or substance abuse  Risk Reduction Factors:   NA  Continued Clinical Symptoms:  Schizophrenia:   Command hallucinatons  Cognitive Features That Contribute To Risk:  Closed-mindedness    Suicide Risk:  Minimal: No identifiable suicidal ideation.  Patients presenting with no risk factors but with morbid ruminations; may be classified  as minimal risk based on the severity of the depressive symptoms  Plan Of Care/Follow-up recommendations:  Activity:  as tolerated Diet:  heart healthy  Disposition: Take all medications as prescribed. Keep all follow-up appointments as scheduled.  Do not consume alcohol or use illegal drugs while on prescription medications. Report any adverse effects from your medications to your primary care provider promptly.  In the event of recurrent symptoms or worsening symptoms, call 911, a crisis hotline, or go to the nearest emergency department for evaluation.   Oneta Rack, NP 11/07/2020, 10:03 AM

## 2020-11-07 NOTE — ED Provider Notes (Signed)
Behavioral Health Admission [facility])  Date: 11/07/20 Patient Name: Cole Barrett MRN: [id number] Chief Complaint:  Chief Complaint  Patient presents with  . Hallucinations    Auditory.  Hearing voices that say they want to kill him.      Diagnoses:  Schizophrenia  HPI: Cole Barrett is a 34 y.o. a male patient who presented to [facility] and was transferred to [facility] for further psychiatric evaluation complaints of auditory hallucinations.  This provider saw the patient face to face, and the chart was reviewed on 11/07/20; EDP, TTS, and nursing notes were reviewed. On evaluation, the patient reports he hears voices, which wake him out of his sleep. He continues to discuss that the voices are telling him that they will hurt him. The patient is here because he wants the voices to stop. The patient disclosed he goes to [facility] every three months for medication management but has no therapy. States he is also prescribed psychotropic medications and is compliant. The patient states that he has been employed to work Monday - Friday and has had no problems with work. He lives alone and says he has no support system. States that the voices appeared while he was sleeping, waking him up.  During the patient evaluation, he is sitting up in a chair. The patient is alert/oriented x 4; calm, cooperative; and mood is congruent with affect. He does not appear to be responding to internal/external stimuli or delusional thoughts but states he hears voices telling him they will kill him. The patient denies suicidal/self-harm/homicidal ideation and paranoia. The patient answered the question appropriately.  Plan: The patient is not a safety risk to himself or others and currently is not requiring psychiatric inpatient admission for stabilization and treatment. PHQ 2-9:  Flowsheet Row ED from 06/05/2020 in [facility] EMERGENCY DEPARTMENT  Thoughts that you would be better off  dead, or of hurting yourself in some way Not at all  PHQ-9 Total Score 0      Flowsheet Row ED from 11/07/2020 in [facility] ED from 11/05/2020 in [facility] EMERGENCY DEPARTMENT ED from 06/05/2020 in [facility] EMERGENCY DEPARTMENT  C-SSRS RISK CATEGORY No Risk Error: Question 6 not populated High Risk       Total Time spent with patient: 20 minutes  Musculoskeletal  Strength & Muscle Tone: within normal limits Gait & Station: normal Patient leans: Right  Psychiatric Specialty Exam  Presentation General Appearance: Appropriate for Environment; Disheveled  Eye Contact:Good  Speech:Clear and Coherent; Normal Rate  Speech Volume:Normal  Handedness:Right   Mood and Affect  Mood:Euthymic  Affect:Appropriate   Thought Process  Thought Processes:Coherent; Goal Directed  Descriptions of Associations:Intact  Orientation:Full (Time, Place and Person)  Thought Content:WDL  Hallucinations:Hallucinations: Auditory Description of Auditory Hallucinations: "They are telling me they are going to kill me."  Ideas of Reference:None  Suicidal Thoughts:Suicidal Thoughts: No  Homicidal Thoughts:Homicidal Thoughts: No   Sensorium  Memory:Immediate Good; Recent Good; Remote Good  Judgment:Intact  Insight:Present   Executive Functions  Concentration:Good  Attention Span:Good  Recall:Good  Fund of Knowledge:Fair  Language:Good   Psychomotor Activity  Psychomotor Activity:Psychomotor Activity: Normal   Assets  Assets:Communication Skills; Desire for Improvement; Housing   Sleep  Sleep:Sleep: Good   Physical Exam Vitals and nursing note reviewed.  Constitutional:      Appearance: Normal appearance. He is normal weight.  HENT:     Right Ear: External ear normal.  Left Ear: External ear normal.     Nose: Nose normal.     Mouth/Throat:     Mouth: Mucous membranes are moist.   Cardiovascular:     Rate and Rhythm: Normal rate.  Pulmonary:     Effort: Pulmonary effort is normal.  Musculoskeletal:        General: Normal range of motion.     Cervical back: Normal range of motion and neck supple.  Neurological:     Mental Status: He is alert and oriented to person, place, and time.  Psychiatric:        Attention and Perception: Attention and perception normal.        Mood and Affect: Mood and affect normal.        Speech: Speech normal.        Behavior: Behavior normal.        Thought Content: Thought content is paranoid.        Cognition and Memory: Cognition and memory normal.        Judgment: Judgment normal.    Review of Systems  Psychiatric/Behavioral: The patient is nervous/anxious and has insomnia.   All other systems reviewed and are negative.   Blood pressure 115/77, pulse 72, temperature (!) 97.5 F (36.4 C), temperature source Temporal, resp. rate 20, height 5\' 10"  (1.778 m), weight 99.8 kg, SpO2 97 %. Body mass index is 31.57 kg/m.  Past Psychiatric History:   Is the patient at risk to self? No  Has the patient been a risk to self in the past 6 months? No .    Has the patient been a risk to self within the distant past? No   Is the patient a risk to others? No   Has the patient been a risk to others in the past 6 months? No   Has the patient been a risk to others within the distant past? No   Past Medical History:  Past Medical History:  Diagnosis Date  . Homelessness   . Hypercholesterolemia   . Mental disorder   . Paranoid behavior (HCC)   . PE (pulmonary embolism)   . Schizophrenia (HCC)    No past surgical history on file.  Family History:  Family History  Problem Relation Age of Onset  . Hypertension Mother     Social History:  Social History   Socioeconomic History  . Marital status: Single    Spouse name: Not on file  . Number of children: Not on file  . Years of education: Not on file  . Highest education  level: Not on file  Occupational History  . Occupation: Employed    Comment: [employer]  Tobacco Use  . Smoking status: Current Every Day Smoker    Packs/day: 0.50    Types: Cigarettes  . Smokeless tobacco: Never Used  Vaping Use  . Vaping Use: Never used  Substance and Sexual Activity  . Alcohol use: Yes    Comment: BAC not available  . Drug use: Yes    Types: Marijuana    Comment: Last used: 2 months ago UDS NA  . Sexual activity: Never  Other Topics Concern  . Not on file  Social History Narrative   ** Merged History Encounter **    Pt lives [place] in [place].  He is employed.  He stated that he is followed by [facility], but that he has not been there in a while.   Social Determinants of Health   Financial Resource Strain: Not  on file  Food Insecurity: Not on file  Transportation Needs: Not on file  Physical Activity: Not on file  Stress: Not on file  Social Connections: Not on file  Intimate Partner Violence: Not on file    SDOH:  SDOH Screenings   Alcohol Screen: Not on file  Depression (PHQ2-9): Low Risk   . PHQ-2 Score: 0  Financial Resource Strain: Not on file  Food Insecurity: Not on file  Housing: Not on file  Physical Activity: Not on file  Social Connections: Not on file  Stress: Not on file  Tobacco Use: High Risk  . Smoking Tobacco Use: Current Every Day Smoker  . Smokeless Tobacco Use: Never Used  Transportation Needs: Not on file    Last Labs:  Admission on 11/06/2020, Discharged on 11/07/2020  Component Date Value Ref Range Status  . SARS Coronavirus 2 by RT PCR 11/07/2020 NEGATIVE  NEGATIVE Final   Comment: (NOTE) SARS-CoV-2 target nucleic acids are NOT DETECTED.  The SARS-CoV-2 RNA is generally detectable in upper respiratory specimens during the acute phase of infection. The lowest concentration of SARS-CoV-2 viral copies this assay can detect is 138 copies/mL. A negative result does not preclude SARS-Cov-2 infection and should  not be used as the sole basis for treatment or other patient management decisions. A negative result may occur with  improper specimen collection/handling, submission of specimen other than nasopharyngeal swab, presence of viral mutation(s) within the areas targeted by this assay, and inadequate number of viral copies(<138 copies/mL). A negative result must be combined with clinical observations, patient history, and epidemiological information. The expected result is Negative.  Fact Sheet for Patients:  BloggerCourse.com  Fact Sheet for Healthcare Providers:  SeriousBroker.it  This test is no                          t yet approved or cleared by the [place] FDA and  has been authorized for detection and/or diagnosis of SARS-CoV-2 by FDA under an Emergency Use Authorization (EUA). This EUA will remain  in effect (meaning this test can be used) for the duration of the COVID-19 declaration under Section 564(b)(1) of the Act, 21 U.S.C.section 360bbb-3(b)(1), unless the authorization is terminated  or revoked sooner.      . Influenza A by PCR 11/07/2020 NEGATIVE  NEGATIVE Final  . Influenza B by PCR 11/07/2020 NEGATIVE  NEGATIVE Final   Comment: (NOTE) The Xpert Xpress SARS-CoV-2/FLU/RSV plus assay is intended as an aid in the diagnosis of influenza from Nasopharyngeal swab specimens and should not be used as a sole basis for treatment. Nasal washings and aspirates are unacceptable for Xpert Xpress SARS-CoV-2/FLU/RSV testing.  Fact Sheet for Patients: BloggerCourse.com  Fact Sheet for Healthcare Providers: SeriousBroker.it  This test is not yet approved or cleared by the [place] FDA and has been authorized for detection and/or diagnosis of SARS-CoV-2 by FDA under an Emergency Use Authorization (EUA). This EUA will remain in effect (meaning this test can be used) for  the duration of the COVID-19 declaration under Section 564(b)(1) of the Act, 21 U.S.C. section 360bbb-3(b)(1), unless the authorization is terminated or revoked.  Performed at [facility], [address], [place], [place] [address]   Admission on 10/27/2020, Discharged on 10/27/2020  Component Date Value Ref Range Status  . SARS Coronavirus 2 by RT PCR 10/27/2020 NEGATIVE  NEGATIVE Final   Comment: (NOTE) SARS-CoV-2 target nucleic acids are NOT DETECTED.  The SARS-CoV-2  RNA is generally detectable in upper respiratory specimens during the acute phase of infection. The lowest concentration of SARS-CoV-2 viral copies this assay can detect is 138 copies/mL. A negative result does not preclude SARS-Cov-2 infection and should not be used as the sole basis for treatment or other patient management decisions. A negative result may occur with  improper specimen collection/handling, submission of specimen other than nasopharyngeal swab, presence of viral mutation(s) within the areas targeted by this assay, and inadequate number of viral copies(<138 copies/mL). A negative result must be combined with clinical observations, patient history, and epidemiological information. The expected result is Negative.  Fact Sheet for Patients:  BloggerCourse.com  Fact Sheet for Healthcare Providers:  SeriousBroker.it  This test is no                          t yet approved or cleared by the [place] FDA and  has been authorized for detection and/or diagnosis of SARS-CoV-2 by FDA under an Emergency Use Authorization (EUA). This EUA will remain  in effect (meaning this test can be used) for the duration of the COVID-19 declaration under Section 564(b)(1) of the Act, 21 U.S.C.section 360bbb-3(b)(1), unless the authorization is terminated  or revoked sooner.      . Influenza A by PCR 10/27/2020 NEGATIVE  NEGATIVE Final  .  Influenza B by PCR 10/27/2020 NEGATIVE  NEGATIVE Final   Comment: (NOTE) The Xpert Xpress SARS-CoV-2/FLU/RSV plus assay is intended as an aid in the diagnosis of influenza from Nasopharyngeal swab specimens and should not be used as a sole basis for treatment. Nasal washings and aspirates are unacceptable for Xpert Xpress SARS-CoV-2/FLU/RSV testing.  Fact Sheet for Patients: BloggerCourse.com  Fact Sheet for Healthcare Providers: SeriousBroker.it  This test is not yet approved or cleared by the [place] FDA and has been authorized for detection and/or diagnosis of SARS-CoV-2 by FDA under an Emergency Use Authorization (EUA). This EUA will remain in effect (meaning this test can be used) for the duration of the COVID-19 declaration under Section 564(b)(1) of the Act, 21 U.S.C. section 360bbb-3(b)(1), unless the authorization is terminated or revoked.  Performed at [facility], [address] [place], [place] [address]   Admission on 10/17/2020, Discharged on 10/17/2020  Component Date Value Ref Range Status  . SARS Coronavirus 2 Ag 10/17/2020 NEGATIVE  NEGATIVE Final   Comment: (NOTE) SARS-CoV-2 antigen NOT DETECTED.   Negative results are presumptive.  Negative results do not preclude SARS-CoV-2 infection and should not be used as the sole basis for treatment or other patient management decisions, including infection  control decisions, particularly in the presence of clinical signs and  symptoms consistent with COVID-19, or in those who have been in contact with the virus.  Negative results must be combined with clinical observations, patient history, and epidemiological information. The expected result is Negative.  Fact Sheet for Patients: https://sanders-williams.net/  Fact Sheet for Healthcare Providers: https://martinez.com/   This test is not yet approved or cleared by  the [place] FDA and  has been authorized for detection and/or diagnosis of SARS-CoV-2 by FDA under an Emergency Use Authorization (EUA).  This EUA will remain in effect (meaning this test can be used) for the duration of  the C                          OVID-19 declaration under Section 564(b)(1) of the Act, 21  U.S.C. section 360bbb-3(b)(1), unless the authorization is terminated or revoked sooner.    Admission on 09/30/2020, Discharged on 09/30/2020  Component Date Value Ref Range Status  . Sodium 09/30/2020 139  135 - 145 mmol/L Final  . Potassium 09/30/2020 3.8  3.5 - 5.1 mmol/L Final  . Chloride 09/30/2020 104  98 - 111 mmol/L Final  . CO2 09/30/2020 24  22 - 32 mmol/L Final  . Glucose, Bld 09/30/2020 94  70 - 99 mg/dL Final   Glucose reference range applies only to samples taken after fasting for at least 8 hours.  . BUN 09/30/2020 12  6 - 20 mg/dL Final  . Creatinine, Ser 09/30/2020 1.03  0.61 - 1.24 mg/dL Final  . Calcium 31/49/7026 9.1  8.9 - 10.3 mg/dL Final  . Total Protein 09/30/2020 7.4  6.5 - 8.1 g/dL Final  . Albumin 37/85/8850 4.1  3.5 - 5.0 g/dL Final  . AST 27/74/1287 33  15 - 41 U/L Final  . ALT 09/30/2020 50* 0 - 44 U/L Final  . Alkaline Phosphatase 09/30/2020 57  38 - 126 U/L Final  . Total Bilirubin 09/30/2020 0.5  0.3 - 1.2 mg/dL Final  . GFR, Estimated 09/30/2020 >60  >60 mL/min Final   Comment: (NOTE) Calculated using the CKD-EPI Creatinine Equation (2021)   . Anion gap 09/30/2020 11  5 - 15 Final   Performed at [facility], [address] [place], [place] [address]  . Alcohol, Ethyl (B) 09/30/2020 70* <10 mg/dL Final   Comment: (NOTE) Lowest detectable limit for serum alcohol is 10 mg/dL.  For medical purposes only. Performed at [facility], [address] [place], [place] [address]   . WBC 09/30/2020 5.2  4.0 - 10.5 K/uL Final  . RBC 09/30/2020 4.88  4.22 - 5.81 MIL/uL Final  . Hemoglobin 09/30/2020 13.4  13.0 - 17.0 g/dL Final   . HCT 09/62/8366 42.4  39.0 - 52.0 % Final  . MCV 09/30/2020 86.9  80.0 - 100.0 fL Final  . MCH 09/30/2020 27.5  26.0 - 34.0 pg Final  . MCHC 09/30/2020 31.6  30.0 - 36.0 g/dL Final  . RDW 29/47/6546 12.5  11.5 - 15.5 % Final  . Platelets 09/30/2020 307  150 - 400 K/uL Final  . nRBC 09/30/2020 0.0  0.0 - 0.2 % Final   Performed at [facility], [address] [place], [place] [address]  . Opiates 09/30/2020 NONE DETECTED  NONE DETECTED Final  . Cocaine 09/30/2020 NONE DETECTED  NONE DETECTED Final  . Benzodiazepines 09/30/2020 NONE DETECTED  NONE DETECTED Final  . Amphetamines 09/30/2020 NONE DETECTED  NONE DETECTED Final  . Tetrahydrocannabinol 09/30/2020 NONE DETECTED  NONE DETECTED Final  . Barbiturates 09/30/2020 NONE DETECTED  NONE DETECTED Final   Comment: (NOTE) DRUG SCREEN FOR MEDICAL PURPOSES ONLY.  IF CONFIRMATION IS NEEDED FOR ANY PURPOSE, NOTIFY LAB WITHIN 5 DAYS.  LOWEST DETECTABLE LIMITS FOR URINE DRUG SCREEN Drug Class                     Cutoff (ng/mL) Amphetamine and metabolites    1000 Barbiturate and metabolites    200 Benzodiazepine                 200 Tricyclics and metabolites     300 Opiates and metabolites        300 Cocaine and metabolites        300 THC  50 Performed at [facility], [address], [place], [place] [address]   . Acetaminophen (Tylenol), Serum 09/30/2020 <10* 10 - 30 ug/mL Final   Comment: (NOTE) Therapeutic concentrations vary significantly. A range of 10-30 ug/mL  may be an effective concentration for many patients. However, some  are best treated at concentrations outside of this range. Acetaminophen concentrations >150 ug/mL at 4 hours after ingestion  and >50 ug/mL at 12 hours after ingestion are often associated with  toxic reactions.  Performed at [facility], [address] [place], [place] [address]   . Salicylate Lvl 09/30/2020 <7.0* 7.0 - 30.0 mg/dL Final   Performed at  [facility], [address], [place], [place] [address]  Admission on 09/15/2020, Discharged on 09/16/2020  Component Date Value Ref Range Status  . Sodium 09/15/2020 138  135 - 145 mmol/L Final  . Potassium 09/15/2020 3.5  3.5 - 5.1 mmol/L Final  . Chloride 09/15/2020 104  98 - 111 mmol/L Final  . CO2 09/15/2020 22  22 - 32 mmol/L Final  . Glucose, Bld 09/15/2020 90  70 - 99 mg/dL Final   Glucose reference range applies only to samples taken after fasting for at least 8 hours.  . BUN 09/15/2020 10  6 - 20 mg/dL Final  . Creatinine, Ser 09/15/2020 0.96  0.61 - 1.24 mg/dL Final  . Calcium 91/47/8295 8.8* 8.9 - 10.3 mg/dL Final  . Total Protein 09/15/2020 7.5  6.5 - 8.1 g/dL Final  . Albumin 62/13/0865 4.1  3.5 - 5.0 g/dL Final  . AST 78/46/9629 33  15 - 41 U/L Final  . ALT 09/15/2020 49* 0 - 44 U/L Final  . Alkaline Phosphatase 09/15/2020 59  38 - 126 U/L Final  . Total Bilirubin 09/15/2020 0.8  0.3 - 1.2 mg/dL Final  . GFR, Estimated 09/15/2020 >60  >60 mL/min Final  . Anion gap 09/15/2020 12  5 - 15 Final   Performed at [facility], [address] [place], [place] [address]  . Alcohol, Ethyl (B) 09/15/2020 29* <10 mg/dL Final   Comment: (NOTE) Lowest detectable limit for serum alcohol is 10 mg/dL.  For medical purposes only. Performed at [facility], [address] [place], [place] [address]   . Salicylate Lvl 09/15/2020 <7.0* 7.0 - 30.0 mg/dL Final   Performed at [facility], [address], [place], [place] [address]  . Acetaminophen (Tylenol), Serum 09/15/2020 <10* 10 - 30 ug/mL Final   Comment: (NOTE) Therapeutic concentrations vary significantly. A range of 10-30 ug/mL  may be an effective concentration for many patients. However, some  are best treated at concentrations outside of this range. Acetaminophen concentrations >150 ug/mL at 4 hours after ingestion  and >50 ug/mL at 12 hours after ingestion are  often associated with  toxic reactions.  Performed at [facility] [place] [facility], [address], [place], [place] [address]   . WBC 09/15/2020 4.8  4.0 - 10.5 K/uL Final  . RBC 09/15/2020 4.73  4.22 - 5.81 MIL/uL Final  . Hemoglobin 09/15/2020 13.4  13.0 - 17.0 g/dL Final  . HCT 40/34/7425 40.9  39.0 - 52.0 % Final  . MCV 09/15/2020 86.5  80.0 - 100.0 fL Final  . MCH 09/15/2020 28.3  26.0 - 34.0 pg Final  . MCHC 09/15/2020 32.8  30.0 - 36.0 g/dL Final  . RDW 95/63/8756 12.6  11.5 - 15.5 % Final  . Platelets 09/15/2020 296  150 - 400 K/uL Final  . nRBC 09/15/2020 0.0  0.0 - 0.2 % Final   Performed at [facility], [address] [place], [place] [address]  . Opiates 09/15/2020 NONE DETECTED  NONE DETECTED Final  . Cocaine 09/15/2020 NONE DETECTED  NONE DETECTED Final  . Benzodiazepines 09/15/2020 NONE DETECTED  NONE DETECTED Final  . Amphetamines 09/15/2020 NONE DETECTED  NONE DETECTED Final  . Tetrahydrocannabinol 09/15/2020 NONE DETECTED  NONE DETECTED Final  . Barbiturates 09/15/2020 NONE DETECTED  NONE DETECTED Final   Comment: (NOTE) DRUG SCREEN FOR MEDICAL PURPOSES ONLY.  IF CONFIRMATION IS NEEDED FOR ANY PURPOSE, NOTIFY LAB WITHIN 5 DAYS.  LOWEST DETECTABLE LIMITS FOR URINE DRUG SCREEN Drug Class                     Cutoff (ng/mL) Amphetamine and metabolites    1000 Barbiturate and metabolites    200 Benzodiazepine                 200 Tricyclics and metabolites     300 Opiates and metabolites        300 Cocaine and metabolites        300 THC                            50 Performed at [facility], [address] [place], [place] [address]   . SARS Coronavirus 2 by RT PCR 09/15/2020 NEGATIVE  NEGATIVE Final   Comment: (NOTE) SARS-CoV-2 target nucleic acids are NOT DETECTED.  The SARS-CoV-2 RNA is generally detectable in upper respiratoy specimens during the acute phase of infection. The lowest concentration of  SARS-CoV-2 viral copies this assay can detect is 131 copies/mL. A negative result does not preclude SARS-Cov-2 infection and should not be used as the sole basis for treatment or other patient management decisions. A negative result may occur with  improper specimen collection/handling, submission of specimen other than nasopharyngeal swab, presence of viral mutation(s) within the areas targeted by this assay, and inadequate number of viral copies (<131 copies/mL). A negative result must be combined with clinical observations, patient history, and epidemiological information. The expected result is Negative.  Fact Sheet for Patients:  https://www.moore.com/https://www.fda.gov/media/142436/download  Fact Sheet for Healthcare Providers:  https://www.young.biz/https://www.fda.gov/media/142435/download  This test is no                          t yet approved or cleared by the [place] FDA and  has been authorized for detection and/or diagnosis of SARS-CoV-2 by FDA under an Emergency Use Authorization (EUA). This EUA will remain  in effect (meaning this test can be used) for the duration of the COVID-19 declaration under Section 564(b)(1) of the Act, 21 U.S.C. section 360bbb-3(b)(1), unless the authorization is terminated or revoked sooner.    . Influenza A by PCR 09/15/2020 NEGATIVE  NEGATIVE Final  . Influenza B by PCR 09/15/2020 NEGATIVE  NEGATIVE Final   Comment: (NOTE) The Xpert Xpress SARS-CoV-2/FLU/RSV assay is intended as an aid in  the diagnosis of influenza from Nasopharyngeal swab specimens and  should not be used as a sole basis for treatment. Nasal washings and  aspirates are unacceptable for Xpert Xpress SARS-CoV-2/FLU/RSV  testing.  Fact Sheet for Patients: https://www.moore.com/https://www.fda.gov/media/142436/download  Fact Sheet for Healthcare Providers: https://www.young.biz/https://www.fda.gov/media/142435/download  This test is not yet approved or cleared by the [place] FDA and  has been authorized for detection and/or diagnosis of  SARS-CoV-2 by  FDA under an Emergency Use Authorization (EUA). This EUA will remain  in effect (meaning this test can be used) for the duration of the  Covid-19 declaration under Section 564(b)(1) of the Act, 21  U.S.C. section 360bbb-3(b)(1), unless the authorization is  terminated or revoked. Performed at [facility], [address] [place], [place] [address]   Admission on 06/05/2020, Discharged on 06/06/2020  Component Date Value Ref Range Status  . Sodium 06/05/2020 139  135 - 145 mmol/L Final  . Potassium 06/05/2020 3.7  3.5 - 5.1 mmol/L Final  . Chloride 06/05/2020 104  98 - 111 mmol/L Final  . CO2 06/05/2020 23  22 - 32 mmol/L Final  . Glucose, Bld 06/05/2020 129* 70 - 99 mg/dL Final   Glucose reference range applies only to samples taken after fasting for at least 8 hours.  . BUN 06/05/2020 15  6 - 20 mg/dL Final  . Creatinine, Ser 06/05/2020 1.12  0.61 - 1.24 mg/dL Final  . Calcium 60/45/4098 8.9  8.9 - 10.3 mg/dL Final  . Total Protein 06/05/2020 6.8  6.5 - 8.1 g/dL Final  . Albumin 11/91/4782 3.9  3.5 - 5.0 g/dL Final  . AST 95/62/1308 22  15 - 41 U/L Final  . ALT 06/05/2020 39  0 - 44 U/L Final  . Alkaline Phosphatase 06/05/2020 37* 38 - 126 U/L Final  . Total Bilirubin 06/05/2020 0.6  0.3 - 1.2 mg/dL Final  . GFR calc non Af Amer 06/05/2020 >60  >60 mL/min Final  . GFR calc Af Amer 06/05/2020 >60  >60 mL/min Final  . Anion gap 06/05/2020 12  5 - 15 Final   Performed at [facility], [address] [place], [place] [address]  . Alcohol, Ethyl (B) 06/05/2020 <10  <10 mg/dL Final   Comment: (NOTE) Lowest detectable limit for serum alcohol is 10 mg/dL.  For medical purposes only. Performed at [facility], [address], [place], [place] [address]   . WBC 06/05/2020 5.2  4.0 - 10.5 K/uL Final  . RBC 06/05/2020 4.57  4.22 - 5.81 MIL/uL Final  . Hemoglobin 06/05/2020 12.5* 13.0 - 17.0 g/dL Final  . HCT 52/84/1324 39.8  39.0 - 52.0 %  Final  . MCV 06/05/2020 87.1  80.0 - 100.0 fL Final  . MCH 06/05/2020 27.4  26.0 - 34.0 pg Final  . MCHC 06/05/2020 31.4  30.0 - 36.0 g/dL Final  . RDW 40/08/2724 12.8  11.5 - 15.5 % Final  . Platelets 06/05/2020 300  150 - 400 K/uL Final  . nRBC 06/05/2020 0.0  0.0 - 0.2 % Final   Performed at [facility], [address], [place], [place] [address]  . Opiates 06/05/2020 NONE DETECTED  NONE DETECTED Final  . Cocaine 06/05/2020 NONE DETECTED  NONE DETECTED Final  . Benzodiazepines 06/05/2020 NONE DETECTED  NONE DETECTED Final  . Amphetamines 06/05/2020 NONE DETECTED  NONE DETECTED Final  . Tetrahydrocannabinol 06/05/2020 NONE DETECTED  NONE DETECTED Final  . Barbiturates 06/05/2020 NONE DETECTED  NONE DETECTED Final   Comment: (NOTE) DRUG SCREEN FOR MEDICAL PURPOSES ONLY.  IF CONFIRMATION IS NEEDED FOR ANY PURPOSE, NOTIFY LAB WITHIN 5 DAYS.  LOWEST DETECTABLE LIMITS FOR URINE DRUG SCREEN Drug Class                     Cutoff (ng/mL) Amphetamine and metabolites  1000 Barbiturate and metabolites    200 Benzodiazepine                 200 Tricyclics and metabolites     300 Opiates and metabolites        300 Cocaine and metabolites        300 THC                            50 Performed at [facility], [address], [place], [place] [address]   Admission on 05/20/2020, Discharged on 05/20/2020  Component Date Value Ref Range Status  . Sodium 05/20/2020 139  135 - 145 mmol/L Final  . Potassium 05/20/2020 4.0  3.5 - 5.1 mmol/L Final  . Chloride 05/20/2020 107  98 - 111 mmol/L Final  . CO2 05/20/2020 21* 22 - 32 mmol/L Final  . Glucose, Bld 05/20/2020 110* 70 - 99 mg/dL Final   Glucose reference range applies only to samples taken after fasting for at least 8 hours.  . BUN 05/20/2020 11  6 - 20 mg/dL Final  . Creatinine, Ser 05/20/2020 1.09  0.61 - 1.24 mg/dL Final  . Calcium 67/89/3810 8.9  8.9 - 10.3 mg/dL Final  . Total Protein 05/20/2020 6.4* 6.5 - 8.1 g/dL  Final  . Albumin 17/51/0258 3.7  3.5 - 5.0 g/dL Final  . AST 52/77/8242 26  15 - 41 U/L Final  . ALT 05/20/2020 38  0 - 44 U/L Final  . Alkaline Phosphatase 05/20/2020 41  38 - 126 U/L Final  . Total Bilirubin 05/20/2020 0.8  0.3 - 1.2 mg/dL Final  . GFR calc non Af Amer 05/20/2020 >60  >60 mL/min Final  . GFR calc Af Amer 05/20/2020 >60  >60 mL/min Final  . Anion gap 05/20/2020 11  5 - 15 Final   Performed at [facility], [address] [place], [place] [address]  . Alcohol, Ethyl (B) 05/20/2020 <10  <10 mg/dL Final   Comment: (NOTE) Lowest detectable limit for serum alcohol is 10 mg/dL.  For medical purposes only. Performed at [facility], [address] [place], [place] [address]   . Salicylate Lvl 05/20/2020 <7.0* 7.0 - 30.0 mg/dL Final   Performed at [facility], [address], [place], [place] [address]  . Acetaminophen (Tylenol), Serum 05/20/2020 <10* 10 - 30 ug/mL Final   Comment: (NOTE) Therapeutic concentrations vary significantly. A range of 10-30 ug/mL  may be an effective concentration for many patients. However, some  are best treated at concentrations outside of this range. Acetaminophen concentrations >150 ug/mL at 4 hours after ingestion  and >50 ug/mL at 12 hours after ingestion are often associated with  toxic reactions.  Performed at [facility], [address] [place], [place] [address]   . WBC 05/20/2020 4.9  4.0 - 10.5 K/uL Final  . RBC 05/20/2020 4.46  4.22 - 5.81 MIL/uL Final  . Hemoglobin 05/20/2020 12.4* 13.0 - 17.0 g/dL Final  . HCT 09/32/6712 39.2  39.0 - 52.0 % Final  . MCV 05/20/2020 87.9  80.0 - 100.0 fL Final  . MCH 05/20/2020 27.8  26.0 - 34.0 pg Final  . MCHC 05/20/2020 31.6  30.0 - 36.0 g/dL Final  . RDW 45/80/9983 12.8  11.5 - 15.5 % Final  . Platelets 05/20/2020 281  150 - 400 K/uL Final  . nRBC 05/20/2020 0.0  0.0 - 0.2 %  Final   Performed at [facility], [address], [place], [place] [address]     Allergies: Haldol [haloperidol]  PTA Medications: (Not in a hospital admission)   Medical Decision Making   Recommendations  Based on my evaluation the patient does not appear to have an emergency medical condition.  [name], NP 11/07/20  7:03 AM

## 2020-11-11 ENCOUNTER — Emergency Department (HOSPITAL_COMMUNITY)
Admission: EM | Admit: 2020-11-11 | Discharge: 2020-11-11 | Disposition: A | Discharge status: Home / Self Care | Payer: Self-pay | Attending: Emergency Medicine | Admitting: Emergency Medicine

## 2020-11-11 ENCOUNTER — Encounter (HOSPITAL_COMMUNITY): Payer: Self-pay

## 2020-11-11 ENCOUNTER — Other Ambulatory Visit: Payer: Self-pay

## 2020-11-11 DIAGNOSIS — F17210 Nicotine dependence, cigarettes, uncomplicated: Secondary | ICD-10-CM | POA: Insufficient documentation

## 2020-11-11 DIAGNOSIS — Z5181 Encounter for therapeutic drug level monitoring: Secondary | ICD-10-CM | POA: Insufficient documentation

## 2020-11-11 DIAGNOSIS — Z7689 Persons encountering health services in other specified circumstances: Secondary | ICD-10-CM

## 2020-11-11 MED ORDER — RISPERIDONE 1 MG PO TABS
1.0000 mg | ORAL_TABLET | Freq: Once | ORAL | Status: AC
  Administered 2020-11-11: 1 mg via ORAL
  Filled 2020-11-11: qty 1

## 2020-11-11 NOTE — ED Triage Notes (Signed)
Pt here for medication refill, states he does not have his "night pills" on him like he thought, pt on risperidol. Denies any additional complaints.

## 2020-11-11 NOTE — ED Provider Notes (Signed)
[name] EMERGENCY DEPARTMENT Provider Note   CSN: [id number] Arrival date & time: 11/11/20  0053     History Chief Complaint  Patient presents with  . Medication Refill    Cole Barrett is a 34 y.o. male.  HPI     This is a 34 year old male with a history of schizophrenia, homelessness who presents requesting medication administration. Patient is well-known to the emergency department. He states that he left his medication across town and needs his medication before he goes to work. He did not take his nighttime or morning dose of Risperdal. He states he actually has his medications but he just left them at a friend's house. He has no physical complaints at this time. No fever, cough, chest pain, shortness breath, abdominal pain, nausea, vomiting.  Past Medical History:  Diagnosis Date  . Homelessness   . Hypercholesterolemia   . Mental disorder   . Paranoid behavior (HCC)   . PE (pulmonary embolism)   . Schizophrenia [facility])     Patient Active Problem List   Diagnosis Date Noted  . Acute psychosis (HCC) 08/31/2019  . Schizophrenia (HCC) 08/31/2019  . Schizoaffective disorder, bipolar type (HCC) 01/19/2019  . Alcohol abuse   . Auditory hallucinations   . Alcohol abuse with alcohol-induced mood disorder (HCC) 09/08/2016  . Homelessness 09/03/2016  . Person feigning illness 09/03/2016  . 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 Cannabis use disorder, mild, abuse 08/29/2016  . Pulmonary emboli (HCC) 07/13/2016  . Adjustment disorder with emotional disturbance 03/12/2014  . 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 Acute left flank pain 01/02/2014  . Pulmonary embolism, bilateral (HCC) 01/02/2014    History reviewed. No pertinent surgical history.     Family History  Problem Relation Age of Onset  . Hypertension Mother     Social History   Tobacco Use  . Smoking status: Current Every Day Smoker    Packs/day:  0.50    Types: Cigarettes  . Smokeless tobacco: Never Used  Vaping Use  . Vaping Use: Never used  Substance Use Topics  . Alcohol use: Yes    Comment: BAC not available  . Drug use: Yes    Types: Marijuana    Comment: Last used: 2 months ago UDS NA    Home Medications Prior to Admission medications   Medication Sig Start Date End Date Taking? Authorizing Provider  RISPERDAL 1 MG tablet Take 1 tablet (1 mg total) by mouth at bedtime. 11/07/20   Oneta Rack, NP  amantadine (SYMMETREL) 100 MG capsule Take 1 capsule (100 mg total) by mouth 2 (two) times daily. Patient not taking: Reported on 11/04/2019 09/01/19 11/17/19  [name], NP    Allergies    Haldol [haloperidol]  Review of Systems   Review of Systems  Constitutional: Negative for fever.  Respiratory: Negative for shortness of breath.   Cardiovascular: Negative for chest pain.  Gastrointestinal: Negative for nausea and vomiting.  All other systems reviewed and are negative.   Physical Exam Updated Vital Signs BP (!) 151/83   Pulse 78   Temp 97.9 F (36.6 C)   Resp 18   SpO2 100%   Physical Exam Vitals and nursing note reviewed.  Constitutional:      Appearance: He is well-developed and well-nourished. He is obese.     Comments: Disheveled appearing but nontoxic  HENT:     Head: Normocephalic and atraumatic.     Mouth/Throat:  Mouth: Mucous membranes are moist.  Cardiovascular:     Rate and Rhythm: Normal rate and regular rhythm.  Pulmonary:     Effort: Pulmonary effort is normal. No respiratory distress.  Abdominal:     Tenderness: There is no abdominal tenderness.  Musculoskeletal:        General: No edema.     Cervical back: Neck supple.  Lymphadenopathy:     Cervical: No cervical adenopathy.  Skin:    General: Skin is warm and dry.  Neurological:     Mental Status: He is alert and oriented to person, place, and time.  Psychiatric:        Mood and Affect: Mood and affect normal.      Comments: Cooperative     ED Results / Procedures / Treatments   Labs (all labs ordered are listed, but only abnormal results are displayed) Labs Reviewed - No data to display  EKG None  Radiology No results found.  Procedures Procedures (including critical care time)  Medications Ordered in ED Medications  risperiDONE (RISPERDAL) tablet 1 mg (has no administration in time range)    ED Course  I have reviewed the triage vital signs and the nursing notes.  Pertinent labs & imaging results that were available during my care of the patient were reviewed by me and considered in my medical decision making (see chart for details).    MDM Rules/Calculators/A&P                          Patient presents requesting his morning medication. He is nontoxic and vital signs are reassuring. He is medically screened and has no physical complaints. Discussed with him that he needs to make sure to keep his medications with him to prevent unnecessary emergency room visits. Patient stated understanding. He was dosed 1 mg of Risperdal.  After history, exam, and medical workup I feel the patient has been appropriately medically screened and is safe for discharge home. Pertinent diagnoses were discussed with the patient. Patient was given return precautions.  Final Clinical Impression(s) / ED Diagnoses Final diagnoses:  Encounter for medication administration    Rx / DC Orders ED Discharge Orders    None       Marzella Miracle, Mayer Masker, MD 11/11/20 [phone]

## 2020-11-11 NOTE — Discharge Instructions (Addendum)
Please make sure that you keep your medications with you so that you can take them as directed on schedule.

## 2020-11-12 ENCOUNTER — Ambulatory Visit (HOSPITAL_COMMUNITY)
Admission: EM | Admit: 2020-11-12 | Discharge: 2020-11-12 | Disposition: A | Discharge status: Home / Self Care | Payer: No Payment, Other | Attending: Nurse Practitioner | Admitting: Nurse Practitioner

## 2020-11-12 ENCOUNTER — Encounter (HOSPITAL_COMMUNITY): Payer: Self-pay | Admitting: Emergency Medicine

## 2020-11-12 ENCOUNTER — Other Ambulatory Visit: Payer: Self-pay

## 2020-11-12 DIAGNOSIS — R440 Auditory hallucinations: Secondary | ICD-10-CM

## 2020-11-12 DIAGNOSIS — F250 Schizoaffective disorder, bipolar type: Secondary | ICD-10-CM | POA: Insufficient documentation

## 2020-11-12 DIAGNOSIS — Z760 Encounter for issue of repeat prescription: Secondary | ICD-10-CM | POA: Insufficient documentation

## 2020-11-12 DIAGNOSIS — Z7689 Persons encountering health services in other specified circumstances: Secondary | ICD-10-CM

## 2020-11-12 MED ORDER — RISPERIDONE 1 MG PO TABS
1.0000 mg | ORAL_TABLET | Freq: Once | ORAL | Status: AC
  Administered 2020-11-12: 1 mg via ORAL
  Filled 2020-11-12: qty 1

## 2020-11-12 NOTE — Discharge Instructions (Addendum)

## 2020-11-12 NOTE — ED Triage Notes (Signed)
Presents requesting a dose of  His home meds, denies SI, HI or AVH.

## 2020-11-12 NOTE — ED Provider Notes (Signed)
[facility]al [facility] Urgent [facility] Medical Screening Exam  Patient Name: Cole Barrett MRN: [id number] Date of Evaluation: 11/12/20 Chief Complaint:   Diagnosis:  Final diagnoses:  Auditory hallucination  Encounter for medication administration  Schizoaffective disorder, bipolar type (HCC)    History of Present illness: Cole Barrett is a 34 y.o. male with a history of schizoaffective disorder who presents to [facility] requesting Risperdal. Patient is well known to behavioral health and area emergency departments. He presents frequently due to auditory hallucinations and requesting Risperdal. Patient was seen at [facility] on 11/11/2020 for similar and was given Risperdal and discharged. Patient was admitted to [facility] 11/07/20 for similar and a prescription was sent to [facility] for Risperdal 1 mg #20. On evaluation patient is alert and oriented x 4, pleasant, and cooperative. Speech is clear and coherent. He states that he is staying at a friend's house and does not have his Risperdal. He is requesting to receive a dose of ripserdal and to be discharged with his friend so he can go to work this morning. He states that he has not had the opportunity to pick up his prescription for Risperdal that was sent on 11/07/20. Mood is euthymic and affect is congruent with mood. Thought process is coherent and thought content is logical. Reports that he hears voices telling him that they are going to kill him. States that the voices are "not too bad" at this time.  No indication that patient is responding to internal stimuli. No evidence of delusional thought content. Denies suicidal ideations. Denies homicidal ideations. Denies substance abuse.    Due to frequent and recent presentations of a similar nature, I do not feel that a TTS assessment is necessary.   Psychiatric Specialty Exam  Presentation  General Appearance:Appropriate for Environment; Neat  Eye Contact:Good  Speech:Clear and  Coherent; Normal Rate  Speech Volume:Normal  Handedness:Right   Mood and Affect  Mood:Euthymic  Affect:Congruent   Thought Process  Thought Processes:Coherent; Goal Directed; Linear  Descriptions of Associations:Intact  Orientation:Full (Time, Place and Person)  Thought Content:Logical  Hallucinations:Auditory voices tell him that they are goign to kill him.  Ideas of Reference:None  Suicidal Thoughts:No  Homicidal Thoughts:No   Sensorium  Memory:Immediate Good; Recent Good; Remote Good  Judgment:Good  Insight:Fair   Executive Functions  Concentration:Good  Attention Span:Good  Recall:Fair  Fund of Knowledge:Fair  Language:Fair   Psychomotor Activity  Psychomotor Activity:Normal   Assets  Assets:Financial Resources/Insurance; Housing; Physical Health; Transportation   Sleep  Sleep:Good  Number of hours: No data recorded  Physical Exam: Physical Exam Constitutional:      General: He is not in acute distress.    Appearance: He is not ill-appearing, toxic-appearing or diaphoretic.  HENT:     Head: Normocephalic.     Right Ear: External ear normal.     Left Ear: External ear normal.  Eyes:     Pupils: Pupils are equal, round, and reactive to light.  Cardiovascular:     Rate and Rhythm: Normal rate.  Pulmonary:     Effort: Pulmonary effort is normal. No respiratory distress.  Musculoskeletal:        General: Normal range of motion.  Skin:    General: Skin is warm and dry.  Neurological:     Mental Status: He is alert and oriented to person, place, and time.  Psychiatric:        Speech: Speech normal.        Behavior: Behavior is cooperative.  Thought Content: Thought content is not paranoid or delusional. Thought content does not include homicidal or suicidal ideation. Thought content does not include suicidal plan.    Review of Systems  Constitutional: Negative for chills, diaphoresis, fever, malaise/fatigue and weight  loss.  HENT: Negative for congestion.   Respiratory: Negative for cough and shortness of breath.   Cardiovascular: Negative for chest pain and palpitations.  Gastrointestinal: Negative for diarrhea, nausea and vomiting.  Neurological: Negative for dizziness and seizures.  Psychiatric/Behavioral: Positive for hallucinations. Negative for depression, memory loss, substance abuse and suicidal ideas. The patient is not nervous/anxious and does not have insomnia.   All other systems reviewed and are negative.  Blood pressure 118/82, temperature (!) 97.1 F (36.2 C), resp. rate 18, SpO2 100 %. There is no height or weight on file to calculate BMI.  Musculoskeletal: Strength & Muscle Tone: within normal limits Gait & Station: normal Patient leans: N/A   [facility] MSE Discharge Disposition for Follow up and Recommendations: Based on my evaluation the patient does not appear to have an emergency medical condition and can be discharged with resources and follow up care in outpatient services for Medication Management and Individual Therapy   Encouraged to pick up his prescription for Risperdal from [facility]. Discussed outpatient services and open access hours offered at [facility]. Continue to follow up with [facility] as scheduled.    Jackelyn Poling, NP 11/12/2020, 4:43 AM

## 2020-11-13 ENCOUNTER — Encounter (HOSPITAL_COMMUNITY): Payer: Self-pay | Admitting: Emergency Medicine

## 2020-11-13 ENCOUNTER — Other Ambulatory Visit: Payer: Self-pay

## 2020-11-13 ENCOUNTER — Emergency Department (HOSPITAL_COMMUNITY)
Admission: EM | Admit: 2020-11-13 | Discharge: 2020-11-13 | Disposition: A | Discharge status: Home / Self Care | Payer: Self-pay | Attending: Emergency Medicine | Admitting: Emergency Medicine

## 2020-11-13 DIAGNOSIS — Z5321 Procedure and treatment not carried out due to patient leaving prior to being seen by health care provider: Secondary | ICD-10-CM | POA: Insufficient documentation

## 2020-11-13 DIAGNOSIS — Z760 Encounter for issue of repeat prescription: Secondary | ICD-10-CM | POA: Insufficient documentation

## 2020-11-13 NOTE — ED Notes (Signed)
This NT asked pt where pt was going and that there was 3 ahead of pt. Pt's stated "okay, thank you. Was going to go smoke." Pt has not returned.

## 2020-11-13 NOTE — ED Notes (Signed)
Called pt x2 for vitals and checked outside, no response.

## 2020-11-13 NOTE — ED Triage Notes (Signed)
Patient here for medication refill.  Patient states he needs his Risperdal.

## 2020-11-13 NOTE — ED Notes (Signed)
Called pt to recheck vitals. No response.  

## 2020-11-14 ENCOUNTER — Other Ambulatory Visit: Payer: Self-pay

## 2020-11-14 ENCOUNTER — Encounter (HOSPITAL_COMMUNITY): Payer: Self-pay | Admitting: Emergency Medicine

## 2020-11-14 ENCOUNTER — Emergency Department (HOSPITAL_COMMUNITY)
Admission: EM | Admit: 2020-11-14 | Discharge: 2020-11-14 | Disposition: A | Discharge status: Home / Self Care | Payer: Self-pay | Attending: Emergency Medicine | Admitting: Emergency Medicine

## 2020-11-14 DIAGNOSIS — F17210 Nicotine dependence, cigarettes, uncomplicated: Secondary | ICD-10-CM | POA: Insufficient documentation

## 2020-11-14 DIAGNOSIS — R443 Hallucinations, unspecified: Secondary | ICD-10-CM

## 2020-11-14 DIAGNOSIS — Z79899 Other long term (current) drug therapy: Secondary | ICD-10-CM | POA: Insufficient documentation

## 2020-11-14 DIAGNOSIS — R440 Auditory hallucinations: Secondary | ICD-10-CM | POA: Insufficient documentation

## 2020-11-14 DIAGNOSIS — Z5900 Homelessness unspecified: Secondary | ICD-10-CM | POA: Insufficient documentation

## 2020-11-14 DIAGNOSIS — Z760 Encounter for issue of repeat prescription: Secondary | ICD-10-CM | POA: Insufficient documentation

## 2020-11-14 MED ORDER — RISPERIDONE 1 MG PO TABS
1.0000 mg | ORAL_TABLET | Freq: Every day | ORAL | Status: DC
  Administered 2020-11-14: 1 mg via ORAL
  Filled 2020-11-14: qty 1

## 2020-11-14 NOTE — ED Triage Notes (Signed)
Patient here for medication Risperdal. Hearing voices.

## 2020-11-14 NOTE — ED Provider Notes (Signed)
[name]   CSN: [id number] Arrival date & time: 11/14/20  0150     History Chief Complaint  Patient presents with  . Medication Refill  . Hallucinations    Cole Barrett is a 34 y.o. male.  Patient presents to the emergency department with chief complaint of requesting a dose of his nighttime Risperdal.  He does have a history of schizophrenia, homelessness, paranoid behavior, and reports having auditory hallucinations.  He states that his medication bottle is at his friend's house, and he would like 1 dose of his evening Risperdal.  He denies any recent illnesses.  Denies any suicidal or homicidal thoughts.  Denies any other associated symptoms.  The history is provided by the patient. No language interpreter was used.       Past Medical History:  Diagnosis Date  . Homelessness   . Hypercholesterolemia   . Mental disorder   . Paranoid behavior (HCC)   . PE (pulmonary embolism)   . Schizophrenia [facility])     Patient Active Problem List   Diagnosis Date Noted  . Acute psychosis (HCC) 08/31/2019  . Schizophrenia (HCC) 08/31/2019  . Schizoaffective disorder, bipolar type (HCC) 01/19/2019  . Alcohol abuse   . Auditory hallucinations   . Alcohol abuse with alcohol-induced mood disorder (HCC) 09/08/2016  . Homelessness 09/03/2016  . Person feigning illness 09/03/2016  . 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 Cannabis use disorder, mild, abuse 08/29/2016  . Pulmonary emboli (HCC) 07/13/2016  . Adjustment disorder with emotional disturbance 03/12/2014  . 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 Acute left flank pain 01/02/2014  . Pulmonary embolism, bilateral (HCC) 01/02/2014    History reviewed. No pertinent surgical history.     Family History  Problem Relation Age of Onset  . Hypertension Mother     Social History   Tobacco Use  . Smoking status: Current Every Day Smoker     Packs/day: 0.50    Types: Cigarettes  . Smokeless tobacco: Never Used  Vaping Use  . Vaping Use: Never used  Substance Use Topics  . Alcohol use: Yes    Comment: BAC not available  . Drug use: Yes    Types: Marijuana    Comment: Last used: 2 months ago UDS NA    Home Medications Prior to Admission medications   Medication Sig Start Date End Date Taking? Authorizing Provider  RISPERDAL 1 MG tablet Take 1 tablet (1 mg total) by mouth at bedtime. 11/07/20   Oneta Rack, NP  amantadine (SYMMETREL) 100 MG capsule Take 1 capsule (100 mg total) by mouth 2 (two) times daily. Patient not taking: Reported on 11/04/2019 09/01/19 11/17/19  [name], NP    Allergies    Haldol [haloperidol]  Review of Systems   Review of Systems  All other systems reviewed and are negative.   Physical Exam Updated Vital Signs BP 133/71 (BP Location: Left Arm)   Pulse 82   Temp 98.5 F (36.9 C) (Oral)   Resp 16   Ht 5\' 10"  (1.778 m)   Wt 99.8 kg   SpO2 96%   BMI 31.57 kg/m   Physical Exam Vitals and nursing note reviewed.  Constitutional:      General: He is not in acute distress.    Appearance: He is well-developed. He is not ill-appearing.  HENT:     Head: Normocephalic and atraumatic.  Eyes:     Conjunctiva/sclera: Conjunctivae normal.  Cardiovascular:  Rate and Rhythm: Normal rate.  Pulmonary:     Effort: Pulmonary effort is normal. No respiratory distress.  Abdominal:     General: There is no distension.  Musculoskeletal:     Cervical back: Neck supple.     Comments: Moves all extremities  Skin:    General: Skin is warm and dry.  Neurological:     Mental Status: He is alert and oriented to person, place, and time.  Psychiatric:        Mood and Affect: Mood normal.        Behavior: Behavior normal.     ED Results / Procedures / Treatments   Labs (all labs ordered are listed, but only abnormal results are displayed) Labs Reviewed - No data to  display  EKG None  Radiology No results found.  Procedures Procedures (including critical care time)  Medications Ordered in ED Medications  risperiDONE (RISPERDAL) tablet 1 mg (has no administration in time range)    ED Course  I have reviewed the triage vital signs and the nursing notes.  Pertinent labs & imaging results that were available during my care of the patient were reviewed by me and considered in my medical decision making (see chart for details).    MDM Rules/Calculators/A&P                          Patient here for a dose of his evening Risperdal.  Well-known to the emergency department.  We will give him his evening dose.  I do not believe him to be a threat to himself or to others, and find him stable for discharge. Final Clinical Impression(s) / ED Diagnoses Final diagnoses:  Hallucinations  Medication refill    Rx / DC Orders ED Discharge Orders    None       Roxy Horseman, PA-C 11/14/20 0304    [name], MD 11/16/20 1038

## 2020-11-16 ENCOUNTER — Encounter (HOSPITAL_COMMUNITY): Payer: Self-pay | Admitting: *Deleted

## 2020-11-16 ENCOUNTER — Emergency Department (HOSPITAL_COMMUNITY)
Admission: EM | Admit: 2020-11-16 | Discharge: 2020-11-16 | Disposition: A | Discharge status: Home / Self Care | Payer: Self-pay | Attending: Emergency Medicine | Admitting: Emergency Medicine

## 2020-11-16 ENCOUNTER — Other Ambulatory Visit: Payer: Self-pay

## 2020-11-16 DIAGNOSIS — Z5900 Homelessness unspecified: Secondary | ICD-10-CM | POA: Insufficient documentation

## 2020-11-16 DIAGNOSIS — Z760 Encounter for issue of repeat prescription: Secondary | ICD-10-CM | POA: Insufficient documentation

## 2020-11-16 DIAGNOSIS — F17210 Nicotine dependence, cigarettes, uncomplicated: Secondary | ICD-10-CM | POA: Insufficient documentation

## 2020-11-16 DIAGNOSIS — R440 Auditory hallucinations: Secondary | ICD-10-CM | POA: Insufficient documentation

## 2020-11-16 MED ORDER — RISPERIDONE 1 MG PO TABS
1.0000 mg | ORAL_TABLET | Freq: Once | ORAL | Status: AC
  Administered 2020-11-16: 1 mg via ORAL
  Filled 2020-11-16: qty 1

## 2020-11-16 NOTE — ED Triage Notes (Signed)
Pt reports needing his night meds. Reports hearing voices. Denies SI or HI. Pt seen at [facility] last night for same. Calm and cooperative at triage, no distress noted.

## 2020-11-16 NOTE — ED Provider Notes (Signed)
[name] EMERGENCY DEPARTMENT Provider Note   CSN: [id number] Arrival date & time: 11/16/20  0006     History Chief Complaint  Patient presents with  . Medication Refill    [name] is a 34 y.o. male.  Patient has no new complaints.  He still has auditory hallucinations this is not new.  Has no other complaints.  No suicidal homicidal ideation.  No trauma.  No recent illnesses.   Medication Refill      Past Medical History:  Diagnosis Date  . Homelessness   . Hypercholesterolemia   . Mental disorder   . Paranoid behavior (HCC)   . PE (pulmonary embolism)   . Schizophrenia [facility])     Patient Active Problem List   Diagnosis Date Noted  . Acute psychosis (HCC) 08/31/2019  . Schizophrenia (HCC) 08/31/2019  . Schizoaffective disorder, bipolar type (HCC) 01/19/2019  . Alcohol abuse   . Auditory hallucinations   . Alcohol abuse with alcohol-induced mood disorder (HCC) 09/08/2016  . Homelessness 09/03/2016  . Person feigning illness 09/03/2016  . 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 Cannabis use disorder, mild, abuse 08/29/2016  . Pulmonary emboli (HCC) 07/13/2016  . Adjustment disorder with emotional disturbance 03/12/2014  . 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 Acute left flank pain 01/02/2014  . Pulmonary embolism, bilateral (HCC) 01/02/2014    History reviewed. No pertinent surgical history.     Family History  Problem Relation Age of Onset  . Hypertension Mother     Social History   Tobacco Use  . Smoking status: Current Every Day Smoker    Packs/day: 0.50    Types: Cigarettes  . Smokeless tobacco: Never Used  Vaping Use  . Vaping Use: Never used  Substance Use Topics  . Alcohol use: Yes    Comment: BAC not available  . Drug use: Yes    Types: Marijuana    Comment: Last used: 2 months ago UDS NA    Home Medications Prior to Admission medications   Medication Sig Start Date End  Date Taking? Authorizing Provider  RISPERDAL 1 MG tablet Take 1 tablet (1 mg total) by mouth at bedtime. 11/07/20   Oneta Rack, NP  amantadine (SYMMETREL) 100 MG capsule Take 1 capsule (100 mg total) by mouth 2 (two) times daily. Patient not taking: Reported on 11/04/2019 09/01/19 11/17/19  [name], NP    Allergies    Haldol [haloperidol]  Review of Systems   Review of Systems  All other systems reviewed and are negative.   Physical Exam Updated Vital Signs BP 118/80 (BP Location: Left Arm)   Pulse 69   Temp 98.4 F (36.9 C) (Oral)   Resp 18   SpO2 98%   Physical Exam Vitals and nursing note reviewed.  Constitutional:      Appearance: He is well-developed and well-nourished.  HENT:     Head: Normocephalic and atraumatic.     Nose: No congestion or rhinorrhea.     Mouth/Throat:     Mouth: Mucous membranes are moist.     Pharynx: Oropharynx is clear.  Eyes:     Pupils: Pupils are equal, round, and reactive to light.  Cardiovascular:     Rate and Rhythm: Normal rate.  Pulmonary:     Effort: Pulmonary effort is normal. No respiratory distress.  Abdominal:     General: Abdomen is flat. There is no distension.  Musculoskeletal:        General:  Normal range of motion.     Cervical back: Normal range of motion.     Right lower leg: No edema.     Left lower leg: No edema.  Skin:    General: Skin is warm and dry.  Neurological:     General: No focal deficit present.     Mental Status: He is alert.     ED Results / Procedures / Treatments   Labs (all labs ordered are listed, but only abnormal results are displayed) Labs Reviewed - No data to display  EKG None  Radiology No results found.  Procedures Procedures (including critical care time)  Medications Ordered in ED Medications  risperiDONE (RISPERDAL) tablet 1 mg (has no administration in time range)    ED Course  I have reviewed the triage vital signs and the nursing notes.  Pertinent  labs & imaging results that were available during my care of the patient were reviewed by me and considered in my medical decision making (see chart for details).    MDM Rules/Calculators/A&P                          Here mostly because he is homeless and doesn't take his medications. Ready to get medication and go home.  Final Clinical Impression(s) / ED Diagnoses Final diagnoses:  Homeless    Rx / DC Orders ED Discharge Orders    None       Minaal Struckman, Barbara Cower, MD 11/16/20 [id number]

## 2020-11-18 ENCOUNTER — Emergency Department (HOSPITAL_COMMUNITY)
Admission: EM | Admit: 2020-11-18 | Discharge: 2020-11-18 | Disposition: A | Discharge status: Home / Self Care | Payer: Self-pay | Attending: Emergency Medicine | Admitting: Emergency Medicine

## 2020-11-18 ENCOUNTER — Encounter (HOSPITAL_COMMUNITY): Payer: Self-pay | Admitting: Emergency Medicine

## 2020-11-18 ENCOUNTER — Emergency Department (HOSPITAL_COMMUNITY): Admission: EM | Admit: 2020-11-18 | Discharge: 2020-11-18 | Discharge status: Against Medical Advice | Payer: Self-pay

## 2020-11-18 DIAGNOSIS — Z760 Encounter for issue of repeat prescription: Secondary | ICD-10-CM | POA: Insufficient documentation

## 2020-11-18 DIAGNOSIS — F1590 Other stimulant use, unspecified, uncomplicated: Secondary | ICD-10-CM | POA: Insufficient documentation

## 2020-11-18 DIAGNOSIS — F17210 Nicotine dependence, cigarettes, uncomplicated: Secondary | ICD-10-CM | POA: Insufficient documentation

## 2020-11-18 MED ORDER — RISPERIDONE 1 MG PO TABS
1.0000 mg | ORAL_TABLET | Freq: Once | ORAL | Status: AC
  Administered 2020-11-18: 1 mg via ORAL
  Filled 2020-11-18: qty 1

## 2020-11-18 NOTE — ED Provider Notes (Signed)
[facility]-EMERGENCY DEPT Provider Note   CSN: [id number] Arrival date & time: 11/18/20  0542     History Chief Complaint  Patient presents with  . Medication Refill    Cole Barrett is a 34 y.o. male who presents for a med refill. He states he needs a dose of his Risperdal. He takes 1mg  once in the morning. He denies SI/HI, AVH at this time. He has multiple ED visits for the same.  HPI     Past Medical History:  Diagnosis Date  . Homelessness   . Hypercholesterolemia   . Mental disorder   . Paranoid behavior (HCC)   . PE (pulmonary embolism)   . Schizophrenia [facility])     Patient Active Problem List   Diagnosis Date Noted  . Acute psychosis (HCC) 08/31/2019  . Schizophrenia (HCC) 08/31/2019  . Schizoaffective disorder, bipolar type (HCC) 01/19/2019  . Alcohol abuse   . Auditory hallucinations   . Alcohol abuse with alcohol-induced mood disorder (HCC) 09/08/2016  . Homelessness 09/03/2016  . Person feigning illness 09/03/2016  . 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 Cannabis use disorder, mild, abuse 08/29/2016  . Pulmonary emboli (HCC) 07/13/2016  . Adjustment disorder with emotional disturbance 03/12/2014  . 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 Acute left flank pain 01/02/2014  . Pulmonary embolism, bilateral (HCC) 01/02/2014    History reviewed. No pertinent surgical history.     Family History  Problem Relation Age of Onset  . Hypertension Mother     Social History   Tobacco Use  . Smoking status: Current Every Day Smoker    Packs/day: 0.50    Types: Cigarettes  . Smokeless tobacco: Never Used  Vaping Use  . Vaping Use: Never used  Substance Use Topics  . Alcohol use: Yes    Comment: BAC not available  . Drug use: Yes    Types: Marijuana    Comment: Last used: 2 months ago UDS NA    Home Medications Prior to Admission medications   Medication Sig Start Date End Date Taking?  Authorizing Provider  RISPERDAL 1 MG tablet Take 1 tablet (1 mg total) by mouth at bedtime. 11/07/20   14/11/21, NP  amantadine (SYMMETREL) 100 MG capsule Take 1 capsule (100 mg total) by mouth 2 (two) times daily. Patient not taking: Reported on 11/04/2019 09/01/19 11/17/19  11/19/19, NP    Allergies    Haldol [haloperidol]  Review of Systems   Review of Systems  Constitutional: Negative for fever.  Psychiatric/Behavioral: Negative for agitation, behavioral problems, confusion and suicidal ideas.    Physical Exam Updated Vital Signs BP (!) 142/117 (BP Location: Right Arm)   Pulse 74   Temp 97.9 F (36.6 C) (Oral)   Resp 18   Ht 5\' 10"  (1.778 m)   Wt 99.8 kg   SpO2 100%   BMI 31.57 kg/m   Physical Exam Vitals and nursing note reviewed.  Constitutional:      General: He is not in acute distress.    Appearance: Normal appearance. He is well-developed and well-nourished. He is not ill-appearing.  HENT:     Head: Normocephalic and atraumatic.  Eyes:     General: No scleral icterus.       Right eye: No discharge.        Left eye: No discharge.     Conjunctiva/sclera: Conjunctivae normal.     Pupils: Pupils are equal, round, and reactive to light.  Cardiovascular:     Rate and Rhythm: Normal rate.  Pulmonary:     Effort: Pulmonary effort is normal. No respiratory distress.  Abdominal:     General: There is no distension.  Musculoskeletal:     Cervical back: Normal range of motion.  Skin:    General: Skin is warm and dry.  Neurological:     Mental Status: He is alert and oriented to person, place, and time.  Psychiatric:        Mood and Affect: Mood and affect and mood normal.        Behavior: Behavior normal.     ED Results / Procedures / Treatments   Labs (all labs ordered are listed, but only abnormal results are displayed) Labs Reviewed - No data to display  EKG None  Radiology No results found.  Procedures Procedures (including critical  care time)  Medications Ordered in ED Medications  risperiDONE (RISPERDAL) tablet 1 mg (has no administration in time range)    ED Course  I have reviewed the triage vital signs and the nursing notes.  Pertinent labs & imaging results that were available during my care of the patient were reviewed by me and considered in my medical decision making (see chart for details).  34 year old male with history of schizoaffective d/o and homelessness presents for a dose of Risperdal. He has hx of the same. He denies any active SI/HI, AVH. He doesn't have a psychiatrist that he follows with regularly. He does have a prescription that was sent to a pharmacy but has not picked it up. He was discouraged from coming to the ED for a dose of his home meds however due to significant psych/social barriers he is likely to do so anyway. At this time patient appears appropriate for d/c.  MDM Rules/Calculators/A&P                           Final Clinical Impression(s) / ED Diagnoses Final diagnoses:  Encounter for medication refill    Rx / DC Orders ED Discharge Orders    None       Bethel Born, PA-C 11/18/20 1093    Linwood Dibbles, MD 11/20/20 [phone]

## 2020-11-18 NOTE — ED Triage Notes (Signed)
He states that he needs his Risperdal. No SI/HI.

## 2020-11-20 ENCOUNTER — Emergency Department (HOSPITAL_COMMUNITY)
Admission: EM | Admit: 2020-11-20 | Discharge: 2020-11-20 | Disposition: A | Discharge status: Home / Self Care | Payer: Self-pay | Attending: Emergency Medicine | Admitting: Emergency Medicine

## 2020-11-20 ENCOUNTER — Encounter (HOSPITAL_COMMUNITY): Payer: Self-pay | Admitting: Emergency Medicine

## 2020-11-20 DIAGNOSIS — F1290 Cannabis use, unspecified, uncomplicated: Secondary | ICD-10-CM | POA: Insufficient documentation

## 2020-11-20 DIAGNOSIS — Z5900 Homelessness unspecified: Secondary | ICD-10-CM | POA: Insufficient documentation

## 2020-11-20 DIAGNOSIS — F909 Attention-deficit hyperactivity disorder, unspecified type: Secondary | ICD-10-CM | POA: Insufficient documentation

## 2020-11-20 DIAGNOSIS — F600 Paranoid personality disorder: Secondary | ICD-10-CM | POA: Insufficient documentation

## 2020-11-20 DIAGNOSIS — F17210 Nicotine dependence, cigarettes, uncomplicated: Secondary | ICD-10-CM | POA: Insufficient documentation

## 2020-11-20 DIAGNOSIS — F99 Mental disorder, not otherwise specified: Secondary | ICD-10-CM | POA: Insufficient documentation

## 2020-11-20 DIAGNOSIS — F1014 Alcohol abuse with alcohol-induced mood disorder: Secondary | ICD-10-CM | POA: Insufficient documentation

## 2020-11-20 DIAGNOSIS — R440 Auditory hallucinations: Secondary | ICD-10-CM | POA: Insufficient documentation

## 2020-11-20 MED ORDER — RISPERIDONE 1 MG PO TABS: 1.0000 mg | ORAL_TABLET | Freq: Once | ORAL | Status: DC

## 2020-11-20 NOTE — ED Triage Notes (Addendum)
Homeless patient here reporting that he needs Risperdal. No SI/HI. Hearing voices "that are saying they are going to kill me".

## 2020-11-20 NOTE — ED Provider Notes (Signed)
[facility]-EMERGENCY DEPT Provider Note   CSN: [id number] Arrival date & time: 11/20/20  4081     History Chief Complaint  Patient presents with  . Hearing Voices    Cole Barrett is a 34 y.o. male who presents with auditory hallucinations. He states that he was picked up by the police tonight and states that he wanted to come here to see if behavioral health would take him because the voices are "really bad". They tell him to kill himself. He states it woke him up from sleep last night. He comes frequently to the ED for the same problem but usually just wants a dose of his medicine. He states the last time he took his meds was yesterday. He denies any physical complaints. He denies SI/HI. When asked if he needs a place to stay he states no that he already has a place to go.  HPI     Past Medical History:  Diagnosis Date  . Homelessness   . Hypercholesterolemia   . Mental disorder   . Paranoid behavior (HCC)   . PE (pulmonary embolism)   . Schizophrenia [facility])     Patient Active Problem List   Diagnosis Date Noted  . Acute psychosis (HCC) 08/31/2019  . Schizophrenia (HCC) 08/31/2019  . Schizoaffective disorder, bipolar type (HCC) 01/19/2019  . Alcohol abuse   . Auditory hallucinations   . Alcohol abuse with alcohol-induced mood disorder (HCC) 09/08/2016  . Homelessness 09/03/2016  . Person feigning illness 09/03/2016  . 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 Cannabis use disorder, mild, abuse 08/29/2016  . Pulmonary emboli (HCC) 07/13/2016  . Adjustment disorder with emotional disturbance 03/12/2014  . 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 Acute left flank pain 01/02/2014  . Pulmonary embolism, bilateral (HCC) 01/02/2014    History reviewed. No pertinent surgical history.     Family History  Problem Relation Age of Onset  . Hypertension Mother     Social History   Tobacco Use  . Smoking status: Current  Every Day Smoker    Packs/day: 0.50    Types: Cigarettes  . Smokeless tobacco: Never Used  Vaping Use  . Vaping Use: Never used  Substance Use Topics  . Alcohol use: Yes    Comment: BAC not available  . Drug use: Yes    Types: Marijuana    Comment: Last used: 2 months ago UDS NA    Home Medications Prior to Admission medications   Medication Sig Start Date End Date Taking? Authorizing Provider  RISPERDAL 1 MG tablet Take 1 tablet (1 mg total) by mouth at bedtime. 11/07/20   Oneta Rack, NP  amantadine (SYMMETREL) 100 MG capsule Take 1 capsule (100 mg total) by mouth 2 (two) times daily. Patient not taking: Reported on 11/04/2019 09/01/19 11/17/19  [name], NP    Allergies    Haldol [haloperidol]  Review of Systems   Review of Systems  Constitutional: Negative for chills and fever.  Respiratory: Negative for shortness of breath.   Cardiovascular: Negative for chest pain.  Gastrointestinal: Negative for abdominal pain.  Neurological: Negative for headaches.  Psychiatric/Behavioral: Positive for hallucinations. Negative for self-injury and suicidal ideas.  All other systems reviewed and are negative.   Physical Exam Updated Vital Signs BP (!) 142/75 (BP Location: Left Arm)   Pulse 67   Temp 98 F (36.7 C) (Oral)   Resp 15   SpO2 100%   Physical Exam Vitals and nursing note  reviewed.  Constitutional:      General: He is not in acute distress.    Appearance: Normal appearance. He is well-developed and well-nourished. He is not ill-appearing.     Comments: Calm, NAD. Disheveled  HENT:     Head: Normocephalic and atraumatic.  Eyes:     General: No scleral icterus.       Right eye: No discharge.        Left eye: No discharge.     Conjunctiva/sclera: Conjunctivae normal.     Pupils: Pupils are equal, round, and reactive to light.  Cardiovascular:     Rate and Rhythm: Normal rate.  Pulmonary:     Effort: Pulmonary effort is normal. No respiratory  distress.  Abdominal:     General: There is no distension.  Musculoskeletal:     Cervical back: Normal range of motion.  Skin:    General: Skin is warm and dry.  Neurological:     Mental Status: He is alert and oriented to person, place, and time.  Psychiatric:        Attention and Perception: Attention normal. He perceives auditory hallucinations.        Mood and Affect: Mood and affect and mood normal.        Speech: Speech normal.        Behavior: Behavior normal. Behavior is cooperative.        Thought Content: Thought content normal.     ED Results / Procedures / Treatments   Labs (all labs ordered are listed, but only abnormal results are displayed) Labs Reviewed  RESP PANEL BY RT-PCR (FLU A&B, COVID) [id number]  COMPREHENSIVE METABOLIC PANEL  ETHANOL  RAPID URINE DRUG SCREEN, HOSP PERFORMED  CBC WITH DIFFERENTIAL/PLATELET    EKG None  Radiology No results found.  Procedures Procedures (including critical care time)  Medications Ordered in ED Medications  risperiDONE (RISPERDAL) tablet 1 mg (has no administration in time range)    ED Course  I have reviewed the triage vital signs and the nursing notes.  Pertinent labs & imaging results that were available during my care of the patient were reviewed by me and considered in my medical decision making (see chart for details).  34 year old male presents with auditory hallucinations which are chronic in nature. He is asking for evaluation by psych since the hallucinations are much worse today. His vitals are normal. He is calm and cooperative. He has no physical complaints. Medical clearance labs ordered.  Informed by nursing that the patient wants to leave. Since his symptoms are chronic in nature I think this is reasonable and I do not feel he is a danger to himself or other at this time. Will d/c.  MDM Rules/Calculators/A&P                           Final Clinical Impression(s) / ED Diagnoses Final diagnoses:   Auditory hallucinations    Rx / DC Orders ED Discharge Orders    None       Bethel Born, PA-C 11/20/20 2297    Tegeler, Canary Brim, MD 11/20/20 [phone]

## 2020-11-20 NOTE — ED Notes (Signed)
Pt come to desk stating he feels much better and would like to leave, Clinical research associate spoke with Tresa Endo PA who is in agreement. Pt states if he has any problems he will follow up at [facility].

## 2020-11-20 NOTE — ED Notes (Signed)
Patient requesting to go to [facility]

## 2020-11-21 ENCOUNTER — Ambulatory Visit (HOSPITAL_COMMUNITY)
Admission: EM | Admit: 2020-11-21 | Discharge: 2020-11-21 | Disposition: A | Discharge status: Home / Self Care | Payer: No Payment, Other | Attending: Behavioral Health | Admitting: Behavioral Health

## 2020-11-21 ENCOUNTER — Other Ambulatory Visit: Payer: Self-pay

## 2020-11-21 DIAGNOSIS — R440 Auditory hallucinations: Secondary | ICD-10-CM

## 2020-11-21 DIAGNOSIS — F250 Schizoaffective disorder, bipolar type: Secondary | ICD-10-CM | POA: Insufficient documentation

## 2020-11-21 MED ORDER — RISPERIDONE 1 MG PO TABS
1.0000 mg | ORAL_TABLET | Freq: Once | ORAL | Status: AC
  Administered 2020-11-21: 1 mg via ORAL
  Filled 2020-11-21: qty 1

## 2020-11-21 NOTE — BH Assessment (Signed)
Comprehensive Clinical Assessment (CCA) Screening, Triage and Referral Note  11/21/2020 Cole Barrett [id number] Pt is unclear about why he came to [facility].  He said he had been asleep and woke up and came to [facility].  Pt says he has medication already but cannot say who it is that prescribes it.  Patient says he was upstairs (points up) last week and got referral information.  Patient denies any SI, HI or A/V hallucinations.  He denies any recent use of ETOH or other substances.    Patient has a flat affect and lays his head down on table, appears drowsy.  Patient is not responding to internal stimuli.  He is not engaged in delusional thinking.  His thought patterns are coherent and clear.  Patient reports appetite and sleep to be WNL.  Patient has been in the emergency departments 11 times in the last 26 days for psychiatric reasons.  He has been to [facility] and invited to get set up with services.  He reports he has taken referral information.   Patient is again informed to come in between 08:00-11:00 for intake services.    -Clinician discussed patient with Elenore Paddy, NP who did is [facility].  She said he did not need inpatient service and was not appropriate for overnight assessment at [facility].  Patient said he can wait in the lobby for someone to pick him up and take him back home.  Chief Complaint: No chief complaint on file.  Visit Diagnosis: MDD recurrent, moderate  Patient Reported Information How did you hear about [place]? Self   Referral name: Patient transferred from [facility] for assessment.   Referral phone number: No data recorded Whom do you see for routine medical problems? I don't have a doctor   Practice/Facility Name: No data recorded  Practice/Facility Phone Number: No data recorded  Name of Contact: No data recorded  Contact Number: No data recorded  Contact Fax Number: No data recorded  Prescriber Name: No data recorded  Prescriber Address (if known): No data  recorded What Is the Reason for Your Visit/Call Today? Pt has some medications at home.  How Long Has This Been Causing You Problems? 1 wk - 1 month  Have You Recently Been in Any Inpatient Treatment (Hospital/Detox/Crisis Center/28-Day Program)? No   Name/Location of Program/Hospital:No data recorded  How Long Were You There? No data recorded  When Were You Discharged? No data recorded Have You Ever Received Services From [facility] Before? Yes   Who Do You See at [facility]? ED visits  Have You Recently Had Any Thoughts About Hurting Yourself? No   Are You Planning to Commit Suicide/Harm Yourself At This time?  No  Have you Recently Had Thoughts About Hurting Someone [name]? No   Explanation: No data recorded Have You Used Any Alcohol or Drugs in the Past 24 Hours? No   How Long Ago Did You Use Drugs or Alcohol?  No data recorded  What Did You Use and How Much? No data recorded What Do You Feel Would Help You the Most Today? Assessment Only  Do You Currently Have a Therapist/Psychiatrist? No   Name of Therapist/Psychiatrist: Patient says he did come to [facility] outpatient services last week.  He just came in to talk to someone.  He may not be set up with a future appointment.   Have You Been Recently Discharged From Any Office Practice or Programs? No   Explanation of Discharge From Practice/Program:  No data recorded  CCA Screening Triage Referral Assessment Type of Contact: Face-to-Face   Is this Initial or Reassessment? Initial Assessment   Date Telepsych consult ordered in [facility]:  11/07/2020   Time Telepsych consult ordered in [facility]:  0113  Patient Reported Information Reviewed? Yes   Patient Left Without Being Seen? No data recorded  Reason for Not Completing Assessment: No data recorded Collateral Involvement: N/A  Does Patient Have a Court Appointed Legal Guardian? No data recorded  Name and Contact of Legal Guardian:  Self  If Minor and Not Living with  Parent(s), Who has Custody? NA  Is CPS involved or ever been involved? Never  Is APS involved or ever been involved? Never  Patient Determined To Be At Risk for Harm To Self or Others Based on Review of Patient Reported Information or Presenting Complaint? No   Method: No data recorded  Availability of Means: No data recorded  Intent: No data recorded  Notification Required: No data recorded  Additional Information for Danger to Others Potential:  No data recorded  Additional Comments for Danger to Others Potential:  No data recorded  Are There Guns or Other Weapons in Your Home?  No data recorded   Types of Guns/Weapons: No data recorded   Are These Weapons Safely Secured?                              No data recorded   Who Could Verify You Are Able To Have These Secured:    No data recorded Do You Have any Outstanding Charges, Pending Court Dates, Parole/Probation? No data recorded Contacted To Inform of Risk of Harm To Self or Others: Other: Comment (N/A)  Location of Assessment: [facility] Assessment Services  Does Patient Present under Involuntary Commitment? No   IVC Papers Initial File Date: No data recorded  [place] of Residence: [name]  Patient Currently Receiving the Following Services: Not Receiving Services   Determination of Need: Routine (7 days)   Options For Referral: Medication Management; Outpatient Therapy   Beatriz Stallion Ray, LCAS

## 2020-11-21 NOTE — ED Provider Notes (Signed)
[facility] Medical Screening Exam  Patient Name: Cole Barrett MRN: [id number] Date of Evaluation: 11/21/20 Chief Complaint:   Diagnosis: Auditory hallucination Schizoaffective disorder, bipolar type (HCC)   History of Present illness: Browning Southwood is a 34 y.o. male with a history of schizoaffective disorder, which presents to [facility] as a walk-in voluntarily requesting to talk to someone and get his Risperdal. The patient received his 1 mg of Risperdal from the nurse. The patient voiced he was sleeping, and the voices woke him up. Therefore, he felt he needed to come to the hospital to talk to someone. It was discussed with the patient that maybe he could benefit from some outpatient therapy. Provider discussed with him to come in on Monday and talk to someone. He agreed to do it. The patient is well known to behavioral health and area emergency departments. He frequently presents due to auditory hallucinations and requesting Risperdal.  Psychiatric Specialty Exam  Presentation  General Appearance:Appropriate for Environment  Eye Contact:Good  Speech:Clear and Coherent  Speech Volume:Normal  Handedness:Right   Mood and Affect  Mood:Euthymic  Affect:Appropriate   Thought Process  Thought Processes:Coherent; Goal Directed  Descriptions of Associations:Intact  Orientation:Full (Time, Place and Person)  Thought Content:Logical  Hallucinations:Auditory The voices just talking to me  Ideas of Reference:None  Suicidal Thoughts:No  Homicidal Thoughts:No   Sensorium  Memory:Immediate Good; Recent Good; Remote Good  Judgment:Good  Insight:Fair   Executive Functions  Concentration:Good  Attention Span:Good  Recall:Good  Fund of Knowledge:Fair  Language:Fair   Psychomotor Activity  Psychomotor Activity:Normal   Assets  Assets:Financial Resources/Insurance; Housing; Physical Health; Social Support   Sleep   Sleep:Good  Number of hours: No data recorded  Physical Exam: Physical Exam Vitals and nursing note reviewed.  Constitutional:      Appearance: Normal appearance. He is normal weight.  HENT:     Right Ear: External ear normal.     Left Ear: External ear normal.     Nose: Nose normal.     Mouth/Throat:     Mouth: Mucous membranes are moist.  Eyes:     Conjunctiva/sclera: Conjunctivae normal.  Cardiovascular:     Rate and Rhythm: Normal rate.     Pulses: Normal pulses.  Pulmonary:     Effort: Pulmonary effort is normal.  Musculoskeletal:        General: Normal range of motion.     Cervical back: Normal range of motion and neck supple.  Neurological:     General: No focal deficit present.     Mental Status: He is alert and oriented to person, place, and time. Mental status is at baseline.  Psychiatric:        Attention and Perception: Attention and perception normal.        Mood and Affect: Mood and affect normal.        Speech: Speech normal.        Behavior: Behavior normal. Behavior is cooperative.        Thought Content: Thought content normal.        Cognition and Memory: Cognition and memory normal.        Judgment: Judgment normal.    Review of Systems  Psychiatric/Behavioral: Positive for hallucinations.  All other systems reviewed and are negative.  Blood pressure (!) 120/57, pulse 88, temperature 97.7 F (36.5 C), temperature source Tympanic, resp. rate 18, SpO2 100 %. There is no height or weight on file to calculate BMI.  Musculoskeletal: Strength &  Muscle Tone: within normal limits Gait & Station: normal Patient leans: N/A   [facility] MSE Discharge Disposition for Follow up and Recommendations: Based on my evaluation the patient does not appear to have an emergency medical condition and can be discharged with resources and follow up care in outpatient services for Medication Management and Individual Therapy   [name], NP 11/21/2020, 10:43  PM

## 2020-11-21 NOTE — Discharge Instructions (Signed)
To follow-up with outpatient services on Monday 11/23/20

## 2020-11-22 ENCOUNTER — Encounter (HOSPITAL_COMMUNITY): Payer: Self-pay | Admitting: Emergency Medicine

## 2020-11-22 ENCOUNTER — Other Ambulatory Visit: Payer: Self-pay

## 2020-11-22 ENCOUNTER — Emergency Department (HOSPITAL_COMMUNITY)
Admission: EM | Admit: 2020-11-22 | Discharge: 2020-11-22 | Disposition: A | Discharge status: Home / Self Care | Payer: Self-pay | Attending: Emergency Medicine | Admitting: Emergency Medicine

## 2020-11-22 DIAGNOSIS — F17210 Nicotine dependence, cigarettes, uncomplicated: Secondary | ICD-10-CM | POA: Insufficient documentation

## 2020-11-22 DIAGNOSIS — F200 Paranoid schizophrenia: Secondary | ICD-10-CM | POA: Insufficient documentation

## 2020-11-22 DIAGNOSIS — F250 Schizoaffective disorder, bipolar type: Secondary | ICD-10-CM | POA: Insufficient documentation

## 2020-11-22 DIAGNOSIS — R440 Auditory hallucinations: Secondary | ICD-10-CM | POA: Insufficient documentation

## 2020-11-22 DIAGNOSIS — Z86718 Personal history of other venous thrombosis and embolism: Secondary | ICD-10-CM | POA: Insufficient documentation

## 2020-11-22 MED ORDER — RISPERIDONE 1 MG PO TABS
1.0000 mg | ORAL_TABLET | Freq: Once | ORAL | Status: AC
  Administered 2020-11-22: 1 mg via ORAL
  Filled 2020-11-22: qty 1

## 2020-11-22 NOTE — ED Triage Notes (Signed)
Patient requesting medication for his hallucinations , no SI/HI , alert and oriented , calm and cooperative .

## 2020-11-22 NOTE — Discharge Instructions (Addendum)
Make sure to take your medications as prescribed. °

## 2020-11-22 NOTE — ED Provider Notes (Signed)
[facility] EMERGENCY DEPARTMENT Provider Note   CSN: [id number] Arrival date & time: 11/22/20  0116   History Chief Complaint  Patient presents with  . Medication Refill    Cole Barrett is a 34 y.o. male.  The history is provided by the patient.  Medication Refill He has history of schizophrenia, hyperlipidemia, homelessness, pulmonary embolism and comes in requesting a dose of his Risperdal.  He states that he left his home and left his medications there and just needs his nighttime dose of Risperdal.  He states he will be back to his home to get his medication tomorrow.  He denies homicidal or suicidal thoughts.  Past Medical History:  Diagnosis Date  . Homelessness   . Hypercholesterolemia   . Mental disorder   . Paranoid behavior (HCC)   . PE (pulmonary embolism)   . Schizophrenia [facility])     Patient Active Problem List   Diagnosis Date Noted  . Acute psychosis (HCC) 08/31/2019  . Schizophrenia (HCC) 08/31/2019  . Schizoaffective disorder, bipolar type (HCC) 01/19/2019  . Alcohol abuse   . Auditory hallucinations   . Alcohol abuse with alcohol-induced mood disorder (HCC) 09/08/2016  . Homelessness 09/03/2016  . Person feigning illness 09/03/2016  . 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 Cannabis use disorder, mild, abuse 08/29/2016  . Pulmonary emboli (HCC) 07/13/2016  . Adjustment disorder with emotional disturbance 03/12/2014  . 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 Acute left flank pain 01/02/2014  . Pulmonary embolism, bilateral (HCC) 01/02/2014    History reviewed. No pertinent surgical history.     Family History  Problem Relation Age of Onset  . Hypertension Mother     Social History   Tobacco Use  . Smoking status: Current Every Day Smoker    Packs/day: 0.50    Types: Cigarettes  . Smokeless tobacco: Never Used  Vaping Use  . Vaping Use: Never used  Substance Use Topics  . Alcohol use:  Yes    Comment: BAC not available  . Drug use: Yes    Types: Marijuana    Comment: Last used: 2 months ago UDS NA    Home Medications Prior to Admission medications   Medication Sig Start Date End Date Taking? Authorizing Provider  RISPERDAL 1 MG tablet Take 1 tablet (1 mg total) by mouth at bedtime. 11/07/20   Oneta Rack, NP  amantadine (SYMMETREL) 100 MG capsule Take 1 capsule (100 mg total) by mouth 2 (two) times daily. Patient not taking: Reported on 11/04/2019 09/01/19 11/17/19  [name], NP    Allergies    Haldol [haloperidol]  Review of Systems   Review of Systems  All other systems reviewed and are negative.   Physical Exam Updated Vital Signs BP 114/72   Pulse 72   Temp 97.8 F (36.6 C) (Oral)   Resp 16   SpO2 100%   Physical Exam Vitals and nursing note reviewed.   34 year old male, resting comfortably and in no acute distress. Vital signs are normal. Oxygen saturation is 100%, which is normal. Head is normocephalic and atraumatic. PERRLA, EOMI. Oropharynx is clear. Neck is nontender and supple without adenopathy or JVD. Back is nontender and there is no CVA tenderness. Lungs are clear without rales, wheezes, or rhonchi. Chest is nontender. Heart has regular rate and rhythm without murmur. Abdomen is soft, flat, nontender without masses or hepatosplenomegaly and peristalsis is normoactive. Extremities have no cyanosis or edema, full range of  motion is present. Skin is warm and dry without rash. Neurologic: Mental status is normal, cranial nerves are intact, moves all extremities equally.  ED Results / Procedures / Treatments    Procedures Procedures  Medications Ordered in ED Medications  risperiDONE (RISPERDAL) tablet 1 mg (has no administration in time range)    ED Course  I have reviewed the triage vital signs and the nursing notes.  Pertinent labs & imaging results that were available during my care of the patient were reviewed by me  and considered in my medical decision making (see chart for details).    MDM Rules/Calculators/A&P Patient with known history of schizophrenia in need of his routine medication.  Old records are reviewed showing he was in the ED yesterday with auditory hallucinations.  He currently shows no sign of acute psychosis.  He is given dose of risperidone and discharged with instructions to follow-up with mental health professionals.  Final Clinical Impression(s) / ED Diagnoses Final diagnoses:  Auditory hallucinations    Rx / DC Orders ED Discharge Orders    None       [name], MD 11/22/20 [phone]

## 2020-11-22 NOTE — ED Notes (Signed)
Patient ambulatory back to hall bed with steady gait. NAD noted.

## 2020-11-24 ENCOUNTER — Emergency Department (HOSPITAL_COMMUNITY)
Admission: EM | Admit: 2020-11-24 | Discharge: 2020-11-24 | Disposition: A | Discharge status: Home / Self Care | Payer: Self-pay | Attending: Emergency Medicine | Admitting: Emergency Medicine

## 2020-11-24 ENCOUNTER — Telehealth (HOSPITAL_COMMUNITY): Payer: Self-pay | Admitting: General Practice

## 2020-11-24 ENCOUNTER — Encounter (HOSPITAL_COMMUNITY): Payer: Self-pay

## 2020-11-24 DIAGNOSIS — Z760 Encounter for issue of repeat prescription: Secondary | ICD-10-CM | POA: Insufficient documentation

## 2020-11-24 DIAGNOSIS — F17210 Nicotine dependence, cigarettes, uncomplicated: Secondary | ICD-10-CM | POA: Insufficient documentation

## 2020-11-24 DIAGNOSIS — F200 Paranoid schizophrenia: Secondary | ICD-10-CM | POA: Insufficient documentation

## 2020-11-24 MED ORDER — RISPERIDONE 1 MG PO TABS
1.0000 mg | ORAL_TABLET | Freq: Once | ORAL | Status: AC
  Administered 2020-11-24: 1 mg via ORAL
  Filled 2020-11-24 (×2): qty 1

## 2020-11-24 NOTE — ED Triage Notes (Signed)
Pt requesting his night time medication dose of Risperdal, states took his last dose last PM, denies HI/SI

## 2020-11-24 NOTE — Discharge Instructions (Addendum)
You came to the emergency department to receive a dose of your Risperdal medication.  You were given 1 mg of Risperdal.  Please see your behavioral health provider for management of your medications.    Please return to the emergency department if You feel out of control. Start having auditory or visual hallucinations You or others notice warning signs of suicide, such as: Increased use of drugs or alcohol Expressing feelings of not having a purpose in life or feeling trapped, guilty, anxious, agitated, or hopeless. Withdrawing from friends and family. Showing uncontrolled anger, recklessness, and dramatic mood changes. Talking about suicide or searching for methods.

## 2020-11-24 NOTE — Telephone Encounter (Signed)
Care Management - Follow Up [facility] Discharges   Writer attempted to make contact with patient today and was unsuccessful.  Writer was not able to leave a message, the phone just rang.

## 2020-11-24 NOTE — ED Provider Notes (Signed)
[name] EMERGENCY DEPARTMENT Provider Note   CSN: [id number] Arrival date & time: 11/24/20  1517     History Chief Complaint  Patient presents with  . Medication Refill    Aiman Noe is a 34 y.o. male history of schizophrenia, hyperlipidemia, and substance use disorder.  Patient denies requesting his "morning dose of Risperdal."  Patient reports that he has this medication at his house but is unable to get there as he has been " working a job on the other side of time."  Patient reports that his medication is prescribed by [employer] [facility]	[place].  Patient reports that he takes 1 mg in the morning and 3 mg at night.  Patient reports that he did take his dose of Risperdal last night.  Patient denies any auditory or visual hallucinations.  Patient denies any homicidal or suicidal ideations.  Patient denies any alcohol or illicit drug use.  Patient has been seen multiple times in the last few months for similar complaints.  Patient refuses to speak to Child psychotherapist.  HPI     Past Medical History:  Diagnosis Date  . Homelessness   . Hypercholesterolemia   . Mental disorder   . Paranoid behavior (HCC)   . PE (pulmonary embolism)   . Schizophrenia [facility])     Patient Active Problem List   Diagnosis Date Noted  . Acute psychosis (HCC) 08/31/2019  . Schizophrenia (HCC) 08/31/2019  . Schizoaffective disorder, bipolar type (HCC) 01/19/2019  . Alcohol abuse   . Auditory hallucinations   . Alcohol abuse with alcohol-induced mood disorder (HCC) 09/08/2016  . Homelessness 09/03/2016  . Person feigning illness 09/03/2016  . 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 Cannabis use disorder, mild, abuse 08/29/2016  . Pulmonary emboli (HCC) 07/13/2016  . Adjustment disorder with emotional disturbance 03/12/2014  . 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 Acute left flank pain 01/02/2014  . Pulmonary embolism, bilateral (HCC) 01/02/2014     History reviewed. No pertinent surgical history.     Family History  Problem Relation Age of Onset  . Hypertension Mother     Social History   Tobacco Use  . Smoking status: Current Every Day Smoker    Packs/day: 0.50    Types: Cigarettes  . Smokeless tobacco: Never Used  Vaping Use  . Vaping Use: Never used  Substance Use Topics  . Alcohol use: Yes    Comment: BAC not available  . Drug use: Yes    Types: Marijuana    Comment: Last used: 2 months ago UDS NA    Home Medications Prior to Admission medications   Medication Sig Start Date End Date Taking? Authorizing Provider  RISPERDAL 1 MG tablet Take 1 tablet (1 mg total) by mouth at bedtime. 11/07/20   Oneta Rack, NP  amantadine (SYMMETREL) 100 MG capsule Take 1 capsule (100 mg total) by mouth 2 (two) times daily. Patient not taking: Reported on 11/04/2019 09/01/19 11/17/19  [name], NP    Allergies    Haldol [haloperidol]  Review of Systems   Review of Systems  Neurological: Negative for dizziness, seizures, syncope, light-headedness and headaches.  Psychiatric/Behavioral: Negative for hallucinations, self-injury and suicidal ideas. The patient is not nervous/anxious.     Physical Exam Updated Vital Signs BP 128/62 (BP Location: Right Arm)   Pulse 72   Temp 97.9 F (36.6 C) (Oral)   Resp 16   SpO2 98%   Physical Exam Vitals and nursing note  reviewed.  Constitutional:      General: He is not in acute distress.    Appearance: He is not ill-appearing, toxic-appearing or diaphoretic.  HENT:     Head: Normocephalic.  Eyes:     General: No scleral icterus.       Right eye: No discharge.        Left eye: No discharge.  Cardiovascular:     Rate and Rhythm: Normal rate.  Pulmonary:     Effort: Pulmonary effort is normal.  Skin:    General: Skin is warm and dry.  Neurological:     General: No focal deficit present.     Mental Status: He is alert.  Psychiatric:        Attention and  Perception: He does not perceive auditory or visual hallucinations.        Behavior: Behavior is not aggressive. Behavior is cooperative.        Thought Content: Thought content does not include homicidal or suicidal plan.     ED Results / Procedures / Treatments   Labs (all labs ordered are listed, but only abnormal results are displayed) Labs Reviewed - No data to display  EKG None  Radiology No results found.  Procedures Procedures (including critical care time)  Medications Ordered in ED Medications  risperiDONE (RISPERDAL) tablet 1 mg (1 mg Oral Given 11/24/20 0951)    ED Course  I have reviewed the triage vital signs and the nursing notes.  Pertinent labs & imaging results that were available during my care of the patient were reviewed by me and considered in my medical decision making (see chart for details).    MDM Rules/Calculators/A&P                          Alert 34 year old male in no acute distress.  Patient has history of schizophrenia.  Per chart review multiple visits to the emergency department requesting medication.  He does admit that he has medication at home.  Patient refused to speak with Child psychotherapist.  Patient denies any auditory or visual hallucinations, homicidal or suicidal ideations.  Patient denies any alcohol or illicit drug use.  Patient is cooperative with exam does not appear to be responding to internal stimuli.  Patient given 1 mg Risperdal.  Vital signs are stable.  Told to follow-up with his behavioral health specialist for further management of his medications.  Patient advised of return precautions.  Patient verbalized understanding and agreed with plan.    Final Clinical Impression(s) / ED Diagnoses Final diagnoses:  Medication refill    Rx / DC Orders ED Discharge Orders    None       Haskel Schroeder, PA-C 11/24/20 1612    Margarita Grizzle, MD 11/24/20 [phone]

## 2020-11-26 DIAGNOSIS — F17210 Nicotine dependence, cigarettes, uncomplicated: Secondary | ICD-10-CM | POA: Insufficient documentation

## 2020-11-26 DIAGNOSIS — Z760 Encounter for issue of repeat prescription: Secondary | ICD-10-CM | POA: Insufficient documentation

## 2020-11-26 DIAGNOSIS — R440 Auditory hallucinations: Secondary | ICD-10-CM | POA: Insufficient documentation

## 2020-11-27 ENCOUNTER — Emergency Department (HOSPITAL_COMMUNITY)
Admission: EM | Admit: 2020-11-27 | Discharge: 2020-11-27 | Disposition: A | Discharge status: Home / Self Care | Payer: Self-pay | Attending: Emergency Medicine | Admitting: Emergency Medicine

## 2020-11-27 ENCOUNTER — Encounter (HOSPITAL_COMMUNITY): Payer: Self-pay | Admitting: Emergency Medicine

## 2020-11-27 DIAGNOSIS — Z760 Encounter for issue of repeat prescription: Secondary | ICD-10-CM

## 2020-11-27 MED ORDER — RISPERIDONE 1 MG PO TABS
1.0000 mg | ORAL_TABLET | Freq: Once | ORAL | Status: AC
  Administered 2020-11-27: 1 mg via ORAL
  Filled 2020-11-27: qty 1

## 2020-11-27 NOTE — ED Provider Notes (Signed)
[facility]-EMERGENCY DEPT Provider Note   CSN: [id number] Arrival date & time: 11/26/20  2342     History Chief Complaint  Patient presents with  . Medication Refill    Cole Barrett is a 34 y.o. male.  Patient here with chief complaint of needing medication refill.  States that he is having auditory hallucinations and needs his nighttime dose of risperdal.  He is frequently here for the same.  He denies SI/HI.  Denies any other associated symptoms.  The history is provided by the patient. No language interpreter was used.       Past Medical History:  Diagnosis Date  . Homelessness   . Hypercholesterolemia   . Mental disorder   . Paranoid behavior (HCC)   . PE (pulmonary embolism)   . Schizophrenia [facility])     Patient Active Problem List   Diagnosis Date Noted  . Acute psychosis (HCC) 08/31/2019  . Schizophrenia (HCC) 08/31/2019  . Schizoaffective disorder, bipolar type (HCC) 01/19/2019  . Alcohol abuse   . Auditory hallucinations   . Alcohol abuse with alcohol-induced mood disorder (HCC) 09/08/2016  . Homelessness 09/03/2016  . Person feigning illness 09/03/2016  . 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 Cannabis use disorder, mild, abuse 08/29/2016  . Pulmonary emboli (HCC) 07/13/2016  . Adjustment disorder with emotional disturbance 03/12/2014  . 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 Acute left flank pain 01/02/2014  . Pulmonary embolism, bilateral (HCC) 01/02/2014    History reviewed. No pertinent surgical history.     Family History  Problem Relation Age of Onset  . Hypertension Mother     Social History   Tobacco Use  . Smoking status: Current Every Day Smoker    Packs/day: 0.50    Types: Cigarettes  . Smokeless tobacco: Never Used  Vaping Use  . Vaping Use: Never used  Substance Use Topics  . Alcohol use: Yes    Comment: BAC not available  . Drug use: Yes    Types: Marijuana     Comment: Last used: 2 months ago UDS NA    Home Medications Prior to Admission medications   Medication Sig Start Date End Date Taking? Authorizing Provider  RISPERDAL 1 MG tablet Take 1 tablet (1 mg total) by mouth at bedtime. 11/07/20   Oneta Rack, NP  amantadine (SYMMETREL) 100 MG capsule Take 1 capsule (100 mg total) by mouth 2 (two) times daily. Patient not taking: Reported on 11/04/2019 09/01/19 11/17/19  [name], NP    Allergies    Haldol [haloperidol]  Review of Systems   Review of Systems  All other systems reviewed and are negative.   Physical Exam Updated Vital Signs BP 137/75 (BP Location: Right Arm)   Pulse 74   Temp 98 F (36.7 C) (Oral)   Resp 16   SpO2 99%   Physical Exam Vitals and nursing note reviewed.  Constitutional:      General: He is not in acute distress.    Appearance: He is well-developed. He is not ill-appearing.  HENT:     Head: Normocephalic and atraumatic.  Eyes:     Conjunctiva/sclera: Conjunctivae normal.  Cardiovascular:     Rate and Rhythm: Normal rate.  Pulmonary:     Effort: Pulmonary effort is normal. No respiratory distress.  Abdominal:     General: There is no distension.  Musculoskeletal:     Cervical back: Neck supple.     Comments: Moves all extremities  Skin:    General: Skin is warm and dry.  Neurological:     Mental Status: He is alert and oriented to person, place, and time.  Psychiatric:        Mood and Affect: Mood normal.        Behavior: Behavior normal.     ED Results / Procedures / Treatments   Labs (all labs ordered are listed, but only abnormal results are displayed) Labs Reviewed - No data to display  EKG None  Radiology No results found.  Procedures Procedures (including critical care time)  Medications Ordered in ED Medications  risperiDONE (RISPERDAL) tablet 1 mg (has no administration in time range)    ED Course  I have reviewed the triage vital signs and the nursing  notes.  Pertinent labs & imaging results that were available during my care of the patient were reviewed by me and considered in my medical decision making (see chart for details).    MDM Rules/Calculators/A&P                         Patient here for his nighttime risperdal.  Has complaints of auditory hallucinations.  Denies any other complaints.  Final Clinical Impression(s) / ED Diagnoses Final diagnoses:  Medication refill    Rx / DC Orders ED Discharge Orders    None       Roxy Horseman, PA-C 11/27/20 0353    Ward, Layla Maw, DO 11/27/20 [phone]

## 2020-11-27 NOTE — ED Triage Notes (Signed)
Patient here reporting that he needs dose of Risperdal. Denies SI/HI.

## 2020-11-28 ENCOUNTER — Encounter (HOSPITAL_COMMUNITY): Payer: Self-pay | Admitting: Emergency Medicine

## 2020-11-28 ENCOUNTER — Other Ambulatory Visit: Payer: Self-pay

## 2020-11-28 ENCOUNTER — Emergency Department (HOSPITAL_COMMUNITY)
Admission: EM | Admit: 2020-11-28 | Discharge: 2020-11-29 | Disposition: A | Discharge status: Home / Self Care | Payer: Self-pay | Attending: Emergency Medicine | Admitting: Emergency Medicine

## 2020-11-28 ENCOUNTER — Encounter (HOSPITAL_COMMUNITY): Payer: Self-pay

## 2020-11-28 ENCOUNTER — Emergency Department (HOSPITAL_COMMUNITY)
Admission: EM | Admit: 2020-11-28 | Discharge: 2020-11-28 | Disposition: A | Discharge status: Home / Self Care | Payer: Self-pay | Attending: Emergency Medicine | Admitting: Emergency Medicine

## 2020-11-28 DIAGNOSIS — Z760 Encounter for issue of repeat prescription: Secondary | ICD-10-CM | POA: Insufficient documentation

## 2020-11-28 DIAGNOSIS — Z5900 Homelessness unspecified: Secondary | ICD-10-CM | POA: Insufficient documentation

## 2020-11-28 DIAGNOSIS — R440 Auditory hallucinations: Secondary | ICD-10-CM | POA: Insufficient documentation

## 2020-11-28 DIAGNOSIS — F209 Schizophrenia, unspecified: Secondary | ICD-10-CM | POA: Insufficient documentation

## 2020-11-28 DIAGNOSIS — F29 Unspecified psychosis not due to a substance or known physiological condition: Secondary | ICD-10-CM | POA: Insufficient documentation

## 2020-11-28 DIAGNOSIS — F17210 Nicotine dependence, cigarettes, uncomplicated: Secondary | ICD-10-CM | POA: Insufficient documentation

## 2020-11-28 MED ORDER — RISPERIDONE 1 MG PO TABS
1.0000 mg | ORAL_TABLET | Freq: Once | ORAL | Status: AC
  Administered 2020-11-28: 1 mg via ORAL
  Filled 2020-11-28: qty 1

## 2020-11-28 NOTE — ED Triage Notes (Signed)
Pt needs night times dose of Risperdal, had morning dose, denies SI/HI

## 2020-11-28 NOTE — ED Provider Notes (Signed)
TIME SEEN: 6:35 AM  CHIEF COMPLAINT: Medication refill  HPI: Patient is a 35 year old male with history of schizophrenia well-known to our emergency department who is requesting a dose of his Risperdal.  States he was unable to take his medicine last night as he was at a [employer] party.  He has chronic auditory hallucinations.  No command hallucinations.  No SI or HI.  Reports he has refill of Risperdal at home but will not be able to get to it for hours and is requesting his dose here.  ROS: See HPI Constitutional: no fever  Eyes: no drainage  ENT: no runny nose   Cardiovascular:  no chest pain  Resp: no SOB  GI: no vomiting GU: no dysuria Integumentary: no rash  Allergy: no hives  Musculoskeletal: no leg swelling  Neurological: no slurred speech ROS otherwise negative  PAST MEDICAL HISTORY/PAST SURGICAL HISTORY:  Past Medical History:  Diagnosis Date  . Homelessness   . Hypercholesterolemia   . Mental disorder   . Paranoid behavior (HCC)   . PE (pulmonary embolism)   . Schizophrenia (HCC)     MEDICATIONS:  Prior to Admission medications   Medication Sig Start Date End Date Taking? Authorizing Provider  RISPERDAL 1 MG tablet Take 1 tablet (1 mg total) by mouth at bedtime. 11/07/20   Oneta Rack, NP  amantadine (SYMMETREL) 100 MG capsule Take 1 capsule (100 mg total) by mouth 2 (two) times daily. Patient not taking: Reported on 11/04/2019 09/01/19 11/17/19  [name], NP    ALLERGIES:  Allergies  Allergen Reactions  . Haldol [Haloperidol] Shortness Of Breath    SOCIAL HISTORY:  Social History   Tobacco Use  . Smoking status: Current Every Day Smoker    Packs/day: 0.50    Types: Cigarettes  . Smokeless tobacco: Never Used  Substance Use Topics  . Alcohol use: Yes    Comment: BAC not available    FAMILY HISTORY: Family History  Problem Relation Age of Onset  . Hypertension Mother     EXAM: BP 118/76 (BP Location: Right Arm)   Pulse 84    Temp 98.4 F (36.9 C)   Resp 16   SpO2 100%  CONSTITUTIONAL: Alert and responds appropriately to questions. Well-appearing; well-nourished HEAD: Normocephalic, atraumatic EYES: Conjunctivae clear, pupils appear equal ENT: normal nose; moist mucous membranes NECK: Normal range of motion CARD: Regular rate and rhythm RESP: Normal chest excursion without splinting or tachypnea; no hypoxia or respiratory distress, speaking full sentences ABD/GI: non-distended EXT: Normal ROM in all joints, no major deformities noted SKIN: Normal color for age and race, no rashes on exposed skin NEURO: Moves all extremities equally, normal speech, no facial asymmetry noted PSYCH: The patient's mood and manner are appropriate.  No SI or HI.  Does not appear to be responding to internal stimuli.  MEDICAL DECISION MAKING: Patient here requesting a dose of his respite all.  Here frequently for the same.  No emergent psychiatric complaint.  Will give 1 mg of Risperdal now and discharge home.  At this time, I do not feel there is any life-threatening condition present. I have reviewed, interpreted and discussed all results (EKG, imaging, lab, urine as appropriate) and exam findings with patient/family. I have reviewed nursing notes and appropriate previous records.  I feel the patient is safe to be discharged home without further emergent workup and can continue workup as an outpatient as needed. Discussed usual and customary return precautions. Patient/family verbalize understanding and  are comfortable with this plan.  Outpatient follow-up has been provided as needed. All questions have been answered.   Jacody Beneke was evaluated in Emergency Department on 11/28/2020 for the symptoms described in the history of present illness. He was evaluated in the context of the global COVID-19 pandemic, which necessitated consideration that the patient might be at risk for infection with the SARS-CoV-2 virus that causes  COVID-19. Institutional protocols and algorithms that pertain to the evaluation of patients at risk for COVID-19 are in a state of rapid change based on information released by regulatory bodies including the CDC and federal and state organizations. These policies and algorithms were followed during the patient's care in the ED.      Marlis Oldaker, Layla Maw, DO 11/28/20 [phone]

## 2020-11-28 NOTE — ED Triage Notes (Signed)
Pt requesting nighttime Resperdal dose. Denies SI/HI. Calm/cooperative. NAD. VSS

## 2020-11-28 NOTE — Discharge Instructions (Addendum)
Please take your medications as prescribed.  Please follow-up with your outpatient psychiatrist as scheduled.

## 2020-11-29 MED ORDER — RISPERIDONE 1 MG PO TABS
1.0000 mg | ORAL_TABLET | Freq: Once | ORAL | Status: AC
  Administered 2020-11-29: 1 mg via ORAL
  Filled 2020-11-29: qty 1

## 2020-11-29 NOTE — ED Provider Notes (Signed)
[facility] EMERGENCY DEPARTMENT Provider Note   CSN: [id number] Arrival date & time: 11/28/20  2004     History Chief Complaint  Patient presents with  . Medication Reaction    Cole Barrett is a 35 y.o. male.  Patient presents to the emergency department requesting his nighttime dose of Risperdal.  Patient reports that he has been spending some time here in [place] and has not been able to get back to his apartment.  His medications are at his apartment and therefore he has not been able to take them.  He has been seen over the last couple of nights here in the emergency department to get his medications.  He has chronic auditory hallucinations, not homicidal or suicidal.        Past Medical History:  Diagnosis Date  . Homelessness   . Hypercholesterolemia   . Mental disorder   . Paranoid behavior (HCC)   . PE (pulmonary embolism)   . Schizophrenia [facility])     Patient Active Problem List   Diagnosis Date Noted  . Acute psychosis (HCC) 08/31/2019  . Schizophrenia (HCC) 08/31/2019  . Schizoaffective disorder, bipolar type (HCC) 01/19/2019  . Alcohol abuse   . Auditory hallucinations   . Alcohol abuse with alcohol-induced mood disorder (HCC) 09/08/2016  . Homelessness 09/03/2016  . Person feigning illness 09/03/2016  . 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 Cannabis use disorder, mild, abuse 08/29/2016  . Pulmonary emboli (HCC) 07/13/2016  . Adjustment disorder with emotional disturbance 03/12/2014  . 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 Acute left flank pain 01/02/2014  . Pulmonary embolism, bilateral (HCC) 01/02/2014    History reviewed. No pertinent surgical history.     Family History  Problem Relation Age of Onset  . Hypertension Mother     Social History   Tobacco Use  . Smoking status: Current Every Day Smoker    Packs/day: 0.50    Types: Cigarettes  . Smokeless tobacco: Never Used  Vaping  Use  . Vaping Use: Never used  Substance Use Topics  . Alcohol use: Yes    Comment: BAC not available  . Drug use: Yes    Types: Marijuana    Comment: Last used: 2 months ago UDS NA    Home Medications Prior to Admission medications   Medication Sig Start Date End Date Taking? Authorizing Provider  RISPERDAL 1 MG tablet Take 1 tablet (1 mg total) by mouth at bedtime. 11/07/20   Oneta Rack, NP  amantadine (SYMMETREL) 100 MG capsule Take 1 capsule (100 mg total) by mouth 2 (two) times daily. Patient not taking: Reported on 11/04/2019 09/01/19 11/17/19  [name], NP    Allergies    Haldol [haloperidol]  Review of Systems   Review of Systems  Psychiatric/Behavioral: Negative for suicidal ideas.  All other systems reviewed and are negative.   Physical Exam Updated Vital Signs BP 98/64 (BP Location: Left Arm)   Pulse 82   Temp 98 F (36.7 C) (Oral)   Resp 16   SpO2 99%   Physical Exam Vitals and nursing note reviewed.  Constitutional:      General: He is not in acute distress.    Appearance: Normal appearance. He is well-developed and well-nourished.  HENT:     Head: Normocephalic and atraumatic.     Right Ear: Hearing normal.     Left Ear: Hearing normal.     Nose: Nose normal.     Mouth/Throat:  Mouth: Oropharynx is clear and moist and mucous membranes are normal.  Eyes:     Extraocular Movements: EOM normal.     Conjunctiva/sclera: Conjunctivae normal.     Pupils: Pupils are equal, round, and reactive to light.  Cardiovascular:     Rate and Rhythm: Regular rhythm.     Heart sounds: S1 normal and S2 normal. No murmur heard. No friction rub. No gallop.   Pulmonary:     Effort: Pulmonary effort is normal. No respiratory distress.     Breath sounds: Normal breath sounds.  Chest:     Chest wall: No tenderness.  Abdominal:     General: Bowel sounds are normal.     Palpations: Abdomen is soft. There is no hepatosplenomegaly.     Tenderness: There is  no abdominal tenderness. There is no guarding or rebound. Negative signs include Murphy's sign and McBurney's sign.     Hernia: No hernia is present.  Musculoskeletal:        General: Normal range of motion.     Cervical back: Normal range of motion and neck supple.  Skin:    General: Skin is warm, dry and intact.     Findings: No rash.     Nails: There is no cyanosis.  Neurological:     Mental Status: He is alert and oriented to person, place, and time.     GCS: GCS eye subscore is 4. GCS verbal subscore is 5. GCS motor subscore is 6.     Cranial Nerves: No cranial nerve deficit.     Sensory: No sensory deficit.     Coordination: Coordination normal.     Deep Tendon Reflexes: Strength normal.  Psychiatric:        Mood and Affect: Mood and affect normal.        Speech: Speech normal.        Behavior: Behavior normal.        Thought Content: Thought content normal.     ED Results / Procedures / Treatments   Labs (all labs ordered are listed, but only abnormal results are displayed) Labs Reviewed - No data to display  EKG None  Radiology No results found.  Procedures Procedures (including critical care time)  Medications Ordered in ED Medications - No data to display  ED Course  I have reviewed the triage vital signs and the nursing notes.  Pertinent labs & imaging results that were available during my care of the patient were reviewed by me and considered in my medical decision making (see chart for details).    MDM Rules/Calculators/A&P                           Final Clinical Impression(s) / ED Diagnoses Final diagnoses:  Schizophrenia, unspecified type [facility])    Rx / DC Orders ED Discharge Orders    None       Sigismund Cross, Canary Brim, MD 11/29/20 [phone]

## 2020-11-30 ENCOUNTER — Encounter (HOSPITAL_COMMUNITY): Payer: Self-pay | Admitting: Emergency Medicine

## 2020-11-30 ENCOUNTER — Emergency Department (HOSPITAL_COMMUNITY)
Admission: EM | Admit: 2020-11-30 | Discharge: 2020-11-30 | Disposition: A | Discharge status: Home / Self Care | Payer: Self-pay | Attending: Emergency Medicine | Admitting: Emergency Medicine

## 2020-11-30 DIAGNOSIS — Z79899 Other long term (current) drug therapy: Secondary | ICD-10-CM

## 2020-11-30 DIAGNOSIS — F17210 Nicotine dependence, cigarettes, uncomplicated: Secondary | ICD-10-CM | POA: Insufficient documentation

## 2020-11-30 DIAGNOSIS — Z5181 Encounter for therapeutic drug level monitoring: Secondary | ICD-10-CM | POA: Insufficient documentation

## 2020-11-30 MED ORDER — RISPERIDONE 1 MG PO TABS
1.0000 mg | ORAL_TABLET | Freq: Once | ORAL | Status: AC
  Administered 2020-11-30: 1 mg via ORAL
  Filled 2020-11-30 (×3): qty 1

## 2020-11-30 NOTE — ED Triage Notes (Signed)
Pt arrived POV requesting daily dose of Risperdal, pt states he does not have script for same.

## 2020-11-30 NOTE — Discharge Instructions (Addendum)
Follow-up with your psychiatrist. Return to the ER if you start to experience thoughts of wanting to hurt yourself or others, chest pain, shortness of breath.

## 2020-11-30 NOTE — ED Provider Notes (Signed)
[name] EMERGENCY DEPARTMENT Provider Note   CSN: [id number] Arrival date & time: 11/30/20  0315     History Chief Complaint  Patient presents with  . Medication Refill    Cole Barrett is a 35 y.o. male with a past medical history of schizophrenia presenting to the ED requesting his daily dose of Risperdal.  States he has been on this for quite some time. Denies any other complaint today. No SI, HI.  He states that he was never given a prescription for his Risperdal.  HPI     Past Medical History:  Diagnosis Date  . Homelessness   . Hypercholesterolemia   . Mental disorder   . Paranoid behavior (HCC)   . PE (pulmonary embolism)   . Schizophrenia [facility])     Patient Active Problem List   Diagnosis Date Noted  . Acute psychosis (HCC) 08/31/2019  . Schizophrenia (HCC) 08/31/2019  . Schizoaffective disorder, bipolar type (HCC) 01/19/2019  . Alcohol abuse   . Auditory hallucinations   . Alcohol abuse with alcohol-induced mood disorder (HCC) 09/08/2016  . Homelessness 09/03/2016  . Person feigning illness 09/03/2016  . 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 Cannabis use disorder, mild, abuse 08/29/2016  . Pulmonary emboli (HCC) 07/13/2016  . Adjustment disorder with emotional disturbance 03/12/2014  . 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 Acute left flank pain 01/02/2014  . Pulmonary embolism, bilateral (HCC) 01/02/2014    History reviewed. No pertinent surgical history.     Family History  Problem Relation Age of Onset  . Hypertension Mother     Social History   Tobacco Use  . Smoking status: Current Every Day Smoker    Packs/day: 0.50    Types: Cigarettes  . Smokeless tobacco: Never Used  Vaping Use  . Vaping Use: Never used  Substance Use Topics  . Alcohol use: Yes    Comment: BAC not available  . Drug use: Yes    Types: Marijuana    Comment: Last used: 2 months ago UDS NA    Home  Medications Prior to Admission medications   Medication Sig Start Date End Date Taking? Authorizing Provider  RISPERDAL 1 MG tablet Take 1 tablet (1 mg total) by mouth at bedtime. 11/07/20   Oneta Rack, NP  amantadine (SYMMETREL) 100 MG capsule Take 1 capsule (100 mg total) by mouth 2 (two) times daily. Patient not taking: Reported on 11/04/2019 09/01/19 11/17/19  [name], NP    Allergies    Haldol [haloperidol]  Review of Systems   Review of Systems  Constitutional: Negative for chills and fever.  Cardiovascular: Negative for chest pain.  Psychiatric/Behavioral: Negative for agitation and suicidal ideas. The patient is not nervous/anxious.     Physical Exam Updated Vital Signs BP 128/62   Pulse 61   Temp 98.1 F (36.7 C) (Oral)   Resp 15   SpO2 96%   Physical Exam Vitals and nursing note reviewed.  Constitutional:      General: He is not in acute distress.    Appearance: He is well-developed and well-nourished. He is not diaphoretic.  HENT:     Head: Normocephalic and atraumatic.  Eyes:     General: No scleral icterus.    Extraocular Movements: EOM normal.     Conjunctiva/sclera: Conjunctivae normal.  Cardiovascular:     Rate and Rhythm: Normal rate and regular rhythm.  Pulmonary:     Effort: Pulmonary effort is normal. No respiratory distress.  Breath sounds: Normal breath sounds.  Musculoskeletal:     Cervical back: Normal range of motion.  Skin:    Findings: No rash.  Neurological:     Mental Status: He is alert.  Psychiatric:        Mood and Affect: Mood and affect normal.     ED Results / Procedures / Treatments   Labs (all labs ordered are listed, but only abnormal results are displayed) Labs Reviewed - No data to display  EKG None  Radiology No results found.  Procedures Procedures (including critical care time)  Medications Ordered in ED Medications  risperiDONE (RISPERDAL) tablet 1 mg (has no administration in time range)     ED Course  I have reviewed the triage vital signs and the nursing notes.  Pertinent labs & imaging results that were available during my care of the patient were reviewed by me and considered in my medical decision making (see chart for details).    MDM Rules/Calculators/A&P                          35 year old male presenting to the ED requesting his nighttime dose of Risperdal.  He has been seen at in the ED almost every day for the past several weeks for the same.  He denies any complaints.  He states that he was never given a prescription for Risperdal which is why he keeps coming to the ER. I can see in the chart that he has been given a prescription. His oxygen saturations were 83% on arrival although I am unsure if this is accurate as on recheck his oxygen saturations are 96% on room air.  He is in no respiratory distress.  Lungs are clear.  We will give him his dose today.  We will have him follow-up with psychiatry.  Return precautions given.   Patient is hemodynamically stable, in NAD, and able to ambulate in the ED. Evaluation does not show pathology that would require ongoing emergent intervention or inpatient treatment. I explained the diagnosis to the patient. Pain has been managed and has no complaints prior to discharge. Patient is comfortable with above plan and is stable for discharge at this time. All questions were answered prior to disposition. Strict return precautions for returning to the ED were discussed. Encouraged follow up with PCP.   An After Visit Summary was printed and given to the patient.   Portions of this note were generated with Scientist, clinical (histocompatibility and immunogenetics). Dictation errors may occur despite best attempts at proofreading.  Final Clinical Impression(s) / ED Diagnoses Final diagnoses:  Medication management    Rx / DC Orders ED Discharge Orders    None       Dietrich Pates, PA-C 11/30/20 [id number]    Jacalyn Lefevre, MD 11/30/20 [id number]

## 2020-11-30 NOTE — ED Notes (Signed)
No answer in WR

## 2020-12-01 ENCOUNTER — Encounter (HOSPITAL_COMMUNITY): Payer: Self-pay

## 2020-12-01 ENCOUNTER — Other Ambulatory Visit: Payer: Self-pay

## 2020-12-01 ENCOUNTER — Emergency Department (HOSPITAL_COMMUNITY)
Admission: EM | Admit: 2020-12-01 | Discharge: 2020-12-01 | Disposition: A | Discharge status: Home / Self Care | Payer: Self-pay | Attending: Emergency Medicine | Admitting: Emergency Medicine

## 2020-12-01 DIAGNOSIS — U071 COVID-19: Secondary | ICD-10-CM | POA: Insufficient documentation

## 2020-12-01 DIAGNOSIS — F17210 Nicotine dependence, cigarettes, uncomplicated: Secondary | ICD-10-CM | POA: Insufficient documentation

## 2020-12-01 DIAGNOSIS — R443 Hallucinations, unspecified: Secondary | ICD-10-CM

## 2020-12-01 DIAGNOSIS — F1590 Other stimulant use, unspecified, uncomplicated: Secondary | ICD-10-CM | POA: Insufficient documentation

## 2020-12-01 DIAGNOSIS — Z760 Encounter for issue of repeat prescription: Secondary | ICD-10-CM | POA: Insufficient documentation

## 2020-12-01 LAB — CBC WITH DIFFERENTIAL/PLATELET
Abs Immature Granulocytes: 0.02 10*3/uL (ref 0.00–0.07)
Basophils Absolute: 0 10*3/uL (ref 0.0–0.1)
Basophils Relative: 0 %
Eosinophils Absolute: 0.2 10*3/uL (ref 0.0–0.5)
Eosinophils Relative: 7 %
HCT: 39.7 % (ref 39.0–52.0)
Hemoglobin: 12.3 g/dL — ABNORMAL LOW (ref 13.0–17.0)
Immature Granulocytes: 1 %
Lymphocytes Relative: 46 %
Lymphs Abs: 1.2 10*3/uL (ref 0.7–4.0)
MCH: 27.2 pg (ref 26.0–34.0)
MCHC: 31 g/dL (ref 30.0–36.0)
MCV: 87.8 fL (ref 80.0–100.0)
Monocytes Absolute: 0.3 10*3/uL (ref 0.1–1.0)
Monocytes Relative: 13 %
Neutro Abs: 0.9 10*3/uL — ABNORMAL LOW (ref 1.7–7.7)
Neutrophils Relative %: 33 %
Platelets: 230 10*3/uL (ref 150–400)
RBC: 4.52 MIL/uL (ref 4.22–5.81)
RDW: 12.7 % (ref 11.5–15.5)
WBC: 2.6 10*3/uL — ABNORMAL LOW (ref 4.0–10.5)
nRBC: 0 % (ref 0.0–0.2)

## 2020-12-01 LAB — BASIC METABOLIC PANEL
Anion gap: 9 (ref 5–15)
BUN: 9 mg/dL (ref 6–20)
CO2: 21 mmol/L — ABNORMAL LOW (ref 22–32)
Calcium: 8.5 mg/dL — ABNORMAL LOW (ref 8.9–10.3)
Chloride: 105 mmol/L (ref 98–111)
Creatinine, Ser: 0.85 mg/dL (ref 0.61–1.24)
GFR, Estimated: >60 mL/min (ref >60)
Glucose, Bld: 88 mg/dL (ref 70–99)
Potassium: 4.1 mmol/L (ref 3.5–5.1)
Sodium: 135 mmol/L (ref 135–145)

## 2020-12-01 LAB — RAPID URINE DRUG SCREEN, HOSP PERFORMED
Amphetamines: NOT DETECTED
Barbiturates: NOT DETECTED
Benzodiazepines: NOT DETECTED
Cocaine: NOT DETECTED
Opiates: NOT DETECTED
Tetrahydrocannabinol: NOT DETECTED

## 2020-12-01 LAB — RESP PANEL BY RT-PCR (FLU A&B, COVID) ARPGX2
Influenza A by PCR: NEGATIVE
Influenza B by PCR: NEGATIVE
SARS Coronavirus 2 by RT PCR: POSITIVE — AB

## 2020-12-01 LAB — ETHANOL: Alcohol, Ethyl (B): <10 mg/dL (ref <10)

## 2020-12-01 MED ORDER — RISPERIDONE 1 MG PO TABS
1.0000 mg | ORAL_TABLET | Freq: Once | ORAL | Status: AC
  Administered 2020-12-01: 1 mg via ORAL
  Filled 2020-12-01: qty 1

## 2020-12-01 MED ORDER — RISPERDAL 1 MG PO TABS
1.0000 mg | ORAL_TABLET | Freq: Every day | ORAL | 0 refills | Status: DC
Start: 2020-12-01 — End: 2020-12-02

## 2020-12-01 NOTE — ED Notes (Signed)
tts at bedside 

## 2020-12-01 NOTE — BH Assessment (Signed)
Comprehensive Clinical Assessment (CCA) Note  12/01/2020 Cole Barrett [id number]  Visit Diagnosis: F20.9 Schizoprenia  Disposition: Cole Sine, NP recommends psychiatric clearance and pt to follow up with [facility] for med mngt and therapy   Cole Barrett is a single 35 yo male who presents voluntarily to [facility] requesting a dose of Risperdal to tx auditory hallucinations. Pt reports after work yesterday he had a sudden onset of auditory hallucinations. He reports he hears voices that are threatening and say they will kill him. Pt has a history of schizophrenia/ schizoaffective dx. Pt reports he used to take Risperal regularly when he went to [place].   Pt denies current suicidal ideation and plan. He denies sx of Depression. He denies homicidal ideation/ history of violence. Pt states current stressors include not having mental health medication.   Pt lives alone, and states he has no social support. He has not seen his brother in a long while/ they aren't close. Pt has a job of 2-3 years at [employer] and says he has acquaintances there. Pt denies hx of abuse.  Pt has good insight and judgment. Pt's memory is intact. Legal history includes no charges.  Protective factors against suicide include no current suicidal ideation, future orientation, no access to firearms, and no current psychotic symptoms.  Pt's OP history includes [facility] in past. IP history includes [facility]. Last admission was at [facility] 08/31/2019.  He states he would like to follow up outpt with [facility]. Pt states he is safe to return home. He declined overnight observation bed, stating he just would like the Risperdal.  Pt denies alcohol/ substance abuse. ? MSE: Pt is casually dressed, alert, oriented x 5 with normal speech and normal motor behavior. Eye contact is good. Pt's mood is euthymic and affect is constricted. Affect is not congruent with mood. Thought process is coherent and relevant. There is no  indication pt is experiencing delusional thought content. Pt was cooperative throughout assessment.   Plan discussed with pt and noted on pt's discharge information: Go to [facility] at [place] in [place] on the 2nd floor. Walk-In hours start at 8 am and are 1st come are 1st served so try to arrive a few minutes before 8 am. If you ever need urgent mental health help, you can be seen 24 hrs a day without an appointment on the 1st floor of the same building.    Chief Complaint:  Chief Complaint  Patient presents with  . Hallucinations        CCA Screening, Triage and Referral (STR)  Patient Reported Information How did you hear about [place]? Other (Comment)  Referral name: [facility] EDP   Whom do you see for routine medical problems? I don't have a doctor   What Is the Reason for Your Visit/Call Today? to get risperdal due to hearing voices start back last night after work  How Long Has This Been Causing You Problems? <Week  What Do You Feel Would Help You the Most Today? Medication   Have You Recently Been in Any Inpatient Treatment (Hospital/Detox/Crisis Center/28-Day Program)? No ([facility] a couple years ago)  Have You Ever Received Services From [facility] Before? Yes  Who Do You See at [facility]? ED visits & [facility] admission   Have You Recently Had Any Thoughts About Hurting Yourself? No  Are You Planning to Commit Suicide/Harm Yourself At This time? No   Have you Recently Had Thoughts About [place]? No  Have You Used Any Alcohol or Drugs in the Past 24 Hours? No  Do You Currently Have a Therapist/Psychiatrist? No (no)  Name of Therapist/Psychiatrist: states no current psychiatrist; used to go to [facility]   Have You Been Recently Discharged From Any Public relations account executive or Programs? No    CCA Screening Triage Referral Assessment Type of Contact: Tele-Assessment  Is this Initial or Reassessment? Initial Assessment  Date  Telepsych consult ordered in [facility]:  12/01/2020  Time Telepsych consult ordered in [facility]:  0915   Patient Reported Information Reviewed? Yes  Collateral Involvement: pt states there is no one he can provide for collateral  Name and Contact of Legal Guardian: Self  If Minor and Not Living with Parent(s), Who has Custody? NA  Is CPS involved or ever been involved? Never  Is APS involved or ever been involved? Never   Patient Determined To Be At Risk for Harm To Self or Others Based on Review of Patient Reported Information or Presenting Complaint? No  Contacted To Inform of Risk of Harm To Self or Others: Other: Comment (N/A)   Location of Assessment: [facility] ED [facility])   Does Patient Present under Involuntary Commitment? No   [place] of Residence: [name]   Patient Currently Receiving the Following Services: Not Receiving Services   Determination of Need: Urgent (48 hours)   Options For Referral: Medication Management; Outpatient Therapy  CCA Biopsychosocial Intake/Chief Complaint:  sudden onset of auditory hallucinations.  Current Symptoms/Problems: auditroy hallucinations- requesting Risperdal   Patient Reported Schizophrenia/Schizoaffective Diagnosis in Past: Yes   Strengths: Pt reported, relaxing, watching TV.  Type of Services Patient Feels are Needed: Does not want inpt or overnight obs. Interested in walk-in appt at [facility] for med mngt and therapy   Initial Clinical Notes/Concerns: Pt reports little to no social support   Mental Health Symptoms Depression:  None   Duration of Depressive symptoms: No data recorded  Mania:  None   Anxiety:   Tension   Psychosis:  Hallucinations; Affective flattening/alogia/avolition   Duration of Psychotic symptoms: Less than six months   Trauma:  None   Obsessions:  None   Compulsions:  None   Inattention:  None   Hyperactivity/Impulsivity:  N/A   Oppositional/Defiant Behaviors:  None   Emotional Irregularity:   N/A   Other Mood/Personality Symptoms:  No data recorded   Mental Status Exam Appearance and self-care  Stature:  Average   Weight:  Average weight   Clothing:  Casual   Grooming:  Normal   Cosmetic use:  None   Posture/gait:  Tense; Normal   Motor activity:  Not Remarkable   Sensorium  Attention:  Normal   Concentration:  Normal   Orientation:  X5   Recall/memory:  Normal   Affect and Mood  Affect:  Constricted   Mood:  Euthymic   Relating  Eye contact:  Normal   Facial expression:  Constricted   Attitude toward examiner:  Cooperative   Thought and Language  Speech flow: Clear and Coherent   Thought content:  Appropriate to Mood and Circumstances   Preoccupation:  None   Hallucinations:  Auditory   Organization:  No data recorded  [employer] of Knowledge:  Good   Intelligence:  Average   Abstraction:  Normal   Judgement:  Good   Reality Testing:  Realistic   Insight:  Fair   Decision Making:  Normal   Social Functioning  Social Maturity:  Isolates; Responsible   Social Judgement:  Normal   Stress  Stressors:  Other (Comment) (Pt reported,voices are worsening.)   Coping Ability:  Deficient supports; Resilient   Skill Deficits:  None   Supports:  Other (Comment) (aquaintences at work)     Religion: Religion/Spirituality Are You A Religious Person?: No  Leisure/Recreation: Leisure / Recreation Do You Have Hobbies?: No  Exercise/Diet: Exercise/Diet Do You Exercise?: No Have You Gained or Lost A Significant Amount of Weight in the Past Six Months?: No Do You Follow a Special Diet?: No Do You Have Any Trouble Sleeping?: No   CCA Employment/Education Employment/Work Situation: Employment / Work Situation Employment situation: Employed 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 Has patient ever been in the [employer]?: No  Education: Education Is Patient Currently Attending School?: No Did Garment/textile technologist From [employer]?: Yes Did Theme park manager?: No Did Designer, television/film set?: No   CCA Family/Childhood History Family and Relationship History: Family history Marital status: Single What is your sexual orientation?: Heterosexual  Does patient have children?: No  Childhood History:  Childhood History By whom was/is the patient raised?: Mother Does patient have siblings?: Yes Number of Siblings: 1 Description of patient's current relationship with siblings: Brother - get along but not close Did patient suffer any verbal/emotional/physical/sexual abuse as a child?: No Has patient ever been sexually abused/assaulted/raped as an adolescent or adult?: No Witnessed domestic violence?: No Has patient been affected by domestic violence as an adult?: No   CCA Substance Use Alcohol/Drug Use: Alcohol / Drug Use Pain Medications: denies Prescriptions: See MAR; states he takes Risperdal but does not have rx currently Over the Counter: none reported History of alcohol / drug use?: No history of alcohol / drug abuse Longest period of sobriety (when/how long): N/A       Recommendations for Services/Supports/Treatments: Recommendations for Services/Supports/Treatments Recommendations For Services/Supports/Treatments: Individual Therapy,Medication Management  DSM5 Diagnoses: Patient Active Problem List   Diagnosis Date Noted  . Acute psychosis (HCC) 08/31/2019  . Schizophrenia (HCC) 08/31/2019  . Schizoaffective disorder, bipolar type (HCC) 01/19/2019  . Alcohol abuse   . Auditory hallucinations   . Alcohol abuse with alcohol-induced mood disorder (HCC) 09/08/2016  . Homelessness 09/03/2016  . Person feigning illness 09/03/2016  . 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 Cannabis use disorder, mild,  abuse 08/29/2016  . Pulmonary emboli (HCC) 07/13/2016  . Adjustment disorder with emotional disturbance 03/12/2014  . 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 Acute left flank pain 01/02/2014  . Pulmonary embolism, bilateral (HCC) 01/02/2014   Disposition: Fredderick Severance, NP recommends psychiatric clearance and pt to follow up with [facility] for med mngt and therapy  Lei Dower Suzan Nailer, LCSW

## 2020-12-01 NOTE — ED Triage Notes (Signed)
Patient reports he is hearing voices and wants Risperdal

## 2020-12-01 NOTE — Progress Notes (Signed)
TOC CM/CSW consult has been received.  CSW contacted CM/RN for this consult.  CM/RN will assess during her shift.  CSW will continue to follow for dc needs.  Ranette Luckadoo Tarpley-Carter, MSW, LCSW-A Pronouns:  She, Her, Hers                  [facility] ED Transitions of CareClinical Social Worker [email or web address] [phone]

## 2020-12-01 NOTE — ED Provider Notes (Signed)
[facility] EMERGENCY DEPARTMENT Provider Note   CSN: [id number] Arrival date & time: 12/01/20  0144     History Chief Complaint  Patient presents with  . Hallucinations    Cole Barrett is a 35 y.o. male.  35 year old male with history of schizophrenia and homelessness presents with request to speak with behavioral health, states that the voices got worse last night, unable to say how.  Patient denies suicidal/homicidal ideation, drug or alcohol use.  Patient has been seen daily in the emergency room for Risperdal, has been given a prescription although states that he does not have a prescription for this yet.  Patient is also been seen by behavioral health urgent care on December 25 and was advised to come to intake between 8 and 11 AM, he has not done so.        Past Medical History:  Diagnosis Date  . Homelessness   . Hypercholesterolemia   . Mental disorder   . Paranoid behavior (HCC)   . PE (pulmonary embolism)   . Schizophrenia [facility])     Patient Active Problem List   Diagnosis Date Noted  . Acute psychosis (HCC) 08/31/2019  . Schizophrenia (HCC) 08/31/2019  . Schizoaffective disorder, bipolar type (HCC) 01/19/2019  . Alcohol abuse   . Auditory hallucinations   . Alcohol abuse with alcohol-induced mood disorder (HCC) 09/08/2016  . Homelessness 09/03/2016  . Person feigning illness 09/03/2016  . 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 Cannabis use disorder, mild, abuse 08/29/2016  . Pulmonary emboli (HCC) 07/13/2016  . Adjustment disorder with emotional disturbance 03/12/2014  . 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 Acute left flank pain 01/02/2014  . Pulmonary embolism, bilateral (HCC) 01/02/2014    History reviewed. No pertinent surgical history.     Family History  Problem Relation Age of Onset  . Hypertension Mother     Social History   Tobacco Use  . Smoking status: Current Every Day Smoker     Packs/day: 0.50    Types: Cigarettes  . Smokeless tobacco: Never Used  Vaping Use  . Vaping Use: Never used  Substance Use Topics  . Alcohol use: Yes    Comment: BAC not available  . Drug use: Yes    Types: Marijuana    Comment: Last used: 2 months ago UDS NA    Home Medications Prior to Admission medications   Medication Sig Start Date End Date Taking? Authorizing Provider  RISPERDAL 1 MG tablet Take 1 tablet (1 mg total) by mouth at bedtime. 12/01/20   Aldean Baker, NP  amantadine (SYMMETREL) 100 MG capsule Take 1 capsule (100 mg total) by mouth 2 (two) times daily. Patient not taking: Reported on 11/04/2019 09/01/19 11/17/19  [name], NP    Allergies    Haldol [haloperidol]  Review of Systems   Review of Systems  Constitutional: Negative for fever.  HENT: Negative for congestion and sore throat.   Respiratory: Negative for shortness of breath.   Cardiovascular: Negative for chest pain.  Gastrointestinal: Negative for abdominal pain.  Musculoskeletal: Negative for arthralgias and myalgias.  Skin: Negative for rash.  Allergic/Immunologic: Negative for immunocompromised state.  Psychiatric/Behavioral: Positive for hallucinations. Negative for suicidal ideas.  All other systems reviewed and are negative.   Physical Exam Updated Vital Signs BP 107/82 (BP Location: Right Arm)   Pulse 84   Temp 98.1 F (36.7 C) (Oral)   Resp 20   Ht 5\' 10"  (1.778 m)  Wt 99.8 kg   SpO2 96%   BMI 31.57 kg/m   Physical Exam Vitals and nursing note reviewed.  Constitutional:      General: He is not in acute distress.    Appearance: He is well-developed and well-nourished. He is not diaphoretic.  HENT:     Head: Normocephalic and atraumatic.  Cardiovascular:     Rate and Rhythm: Normal rate and regular rhythm.     Pulses: Normal pulses.     Heart sounds: Normal heart sounds.  Pulmonary:     Effort: Pulmonary effort is normal.     Breath sounds: Normal breath sounds.   Skin:    General: Skin is warm and dry.  Neurological:     Mental Status: He is alert and oriented to person, place, and time.  Psychiatric:        Attention and Perception: He is attentive. He does not perceive visual hallucinations.        Mood and Affect: Mood and affect and mood normal.        Speech: Speech normal.        Behavior: Behavior normal. Behavior is not agitated, aggressive or hyperactive.        Thought Content: Thought content does not include homicidal or suicidal ideation.        Cognition and Memory: Cognition normal.     ED Results / Procedures / Treatments   Labs (all labs ordered are listed, but only abnormal results are displayed) Labs Reviewed  RESP PANEL BY RT-PCR (FLU A&B, COVID) ARPGX2 - Abnormal; Notable for the following components:      Result Value   SARS Coronavirus 2 by RT PCR POSITIVE (*)    All other components within normal limits  BASIC METABOLIC PANEL - Abnormal; Notable for the following components:   CO2 21 (*)    Calcium 8.5 (*)    All other components within normal limits  CBC WITH DIFFERENTIAL/PLATELET - Abnormal; Notable for the following components:   WBC 2.6 (*)    Hemoglobin 12.3 (*)    Neutro Abs 0.9 (*)    All other components within normal limits  ETHANOL  RAPID URINE DRUG SCREEN, HOSP PERFORMED    EKG None  Radiology No results found.  Procedures Procedures (including critical care time)  Medications Ordered in ED Medications  risperiDONE (RISPERDAL) tablet 1 mg (1 mg Oral Given 12/01/20 1137)    ED Course  I have reviewed the triage vital signs and the nursing notes.  Pertinent labs & imaging results that were available during my care of the patient were reviewed by me and considered in my medical decision making (see chart for details).  Clinical Course as of 12/01/20 1300  Tue Dec 01, 1008  2549 35 year old male with history of schizophrenia presents with report of worsening hallucinations.  Patient has  been seen daily in the ED for Risperdal dosing despite having received several prescriptions for same. Patient was evaluated by TTS, with the assistance of social work, plan is for patient to follow-up during walk-in clinic hours tomorrow to establish care and work on treatment plan. Patient was discharged with clear understanding that this was the plan however, after discharge, his Covid test has returned positive. Follow-up message sent to social worker to evaluate plan for follow-up to best assist this patient in ongoing care. [LM]    Clinical Course User Index [LM] Roque Lias   MDM Rules/Calculators/A&P  Final Clinical Impression(s) / ED Diagnoses Final diagnoses:  Hallucinations  COVID-19    Rx / DC Orders ED Discharge Orders         Ordered    RISPERDAL 1 MG tablet  Daily at bedtime        12/01/20 1022           Alden Hipp 12/01/20 1300    Jacalyn Lefevre, MD 12/01/20 1430

## 2020-12-01 NOTE — Discharge Instructions (Addendum)
Go to [facility] at [address] in [place] on the 2nd floor.   Walk-In hours start at 8 am and are 1st come are 1st served so try to arrive a few minutes before 8 am.   If you ever need urgent mental health help, you can be seen 24 hrs a day without an appointment on the 1st floor of the same building.

## 2020-12-02 ENCOUNTER — Emergency Department (HOSPITAL_COMMUNITY)
Admission: EM | Admit: 2020-12-02 | Discharge: 2020-12-02 | Disposition: A | Discharge status: Home / Self Care | Payer: Self-pay | Attending: Emergency Medicine | Admitting: Emergency Medicine

## 2020-12-02 ENCOUNTER — Other Ambulatory Visit: Payer: Self-pay

## 2020-12-02 DIAGNOSIS — Z760 Encounter for issue of repeat prescription: Secondary | ICD-10-CM

## 2020-12-02 MED ORDER — RISPERDAL 1 MG PO TABS: 1.0000 mg | ORAL_TABLET | Freq: Every day | ORAL | 0 refills | Status: DC

## 2020-12-02 MED ORDER — RISPERIDONE 1 MG PO TABS
1.0000 mg | ORAL_TABLET | Freq: Once | ORAL | Status: AC
  Administered 2020-12-02: 1 mg via ORAL
  Filled 2020-12-02 (×2): qty 1

## 2020-12-02 NOTE — ED Triage Notes (Signed)
Pt asking for his risperdal dose

## 2020-12-02 NOTE — Discharge Instructions (Signed)
Please go pick up your prescription at the pharmacy listed below.  Please follow-up with the behavioral health urgent care in 7 to 10 days after you quarantine.

## 2020-12-02 NOTE — ED Provider Notes (Signed)
[name] EMERGENCY DEPARTMENT Provider Note   CSN: [id number] Arrival date & time: 12/01/20  2356     History Chief Complaint  Patient presents with  . Medication Refill    Cole Barrett is a 35 y.o. male.  HPI 35 year old male with a history of homelessness, hypercholesterolemia, schizophrenia, pulmonary embolism presents to the ER requesting his dose of Risperdal.  Patient seen here almost daily since the end of November with request for medications.  He reported that there were no prescription was refilled, despite multiple prescriptions in the chart.  Yesterday he was seen here in the previous provider had arranged follow-up at the behavior health urgent care to have his meds refilled.  Unfortunately, his Covid test came back positive yesterday.  Prior provider did inform the patient, however when I told him yesterday he was not aware of this.  He stated that he was planning on going straight there after he gets his morning dose.  Has no complaints of cough, shortness of breath, chest pain or fever.   Past Medical History:  Diagnosis Date  . Homelessness   . Hypercholesterolemia   . Mental disorder   . Paranoid behavior (HCC)   . PE (pulmonary embolism)   . Schizophrenia [facility])     Patient Active Problem List   Diagnosis Date Noted  . Acute psychosis (HCC) 08/31/2019  . Schizophrenia (HCC) 08/31/2019  . Schizoaffective disorder, bipolar type (HCC) 01/19/2019  . Alcohol abuse   . Auditory hallucinations   . Alcohol abuse with alcohol-induced mood disorder (HCC) 09/08/2016  . Homelessness 09/03/2016  . Person feigning illness 09/03/2016  . 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 Cannabis use disorder, mild, abuse 08/29/2016  . Pulmonary emboli (HCC) 07/13/2016  . Adjustment disorder with emotional disturbance 03/12/2014  . 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 Acute left flank pain 01/02/2014  . Pulmonary embolism,  bilateral (HCC) 01/02/2014    No past surgical history on file.     Family History  Problem Relation Age of Onset  . Hypertension Mother     Social History   Tobacco Use  . Smoking status: Current Every Day Smoker    Packs/day: 0.50    Types: Cigarettes  . Smokeless tobacco: Never Used  Vaping Use  . Vaping Use: Never used  Substance Use Topics  . Alcohol use: Yes    Comment: BAC not available  . Drug use: Yes    Types: Marijuana    Comment: Last used: 2 months ago UDS NA    Home Medications Prior to Admission medications   Medication Sig Start Date End Date Taking? Authorizing Provider  RISPERDAL 1 MG tablet Take 1 tablet (1 mg total) by mouth at bedtime. 12/02/20   Mare Ferrari, PA-C  amantadine (SYMMETREL) 100 MG capsule Take 1 capsule (100 mg total) by mouth 2 (two) times daily. Patient not taking: Reported on 11/04/2019 09/01/19 11/17/19  [name], NP    Allergies    Haldol [haloperidol]  Review of Systems   Review of Systems  Physical Exam Updated Vital Signs BP 115/81 (BP Location: Right Arm)   Pulse 80   Temp 97.9 F (36.6 C) (Oral)   Resp 18   SpO2 98%   Physical Exam  ED Results / Procedures / Treatments   Labs (all labs ordered are listed, but only abnormal results are displayed) Labs Reviewed - No data to display  EKG None  Radiology No results found.  Procedures Procedures (including critical care time)  Medications Ordered in ED Medications  risperiDONE (RISPERDAL) tablet 1 mg (has no administration in time range)    ED Course  I have reviewed the triage vital signs and the nursing notes.  Pertinent labs & imaging results that were available during my care of the patient were reviewed by me and considered in my medical decision making (see chart for details).    MDM Rules/Calculators/A&P                          35 year old male here requesting Risperdal dose.  Seen here multiple times for the same.  Tested positive  for Covid yesterday, had pending followup at [facility].  Patient was informed that he cannot be seen by [facility] until his Covid test is negative.  He will be given his dose here. I told him I will send him another prescription of Risperdal to the pharmacy and that he should pick this up.  Patient with no symptoms of chest pain shortness of breath, vitals reassuring.  Stable for discharge.  Discussed the case with Dr. Freida Busman who is agreeable to the plan disposition.  Final Clinical Impression(s) / ED Diagnoses Final diagnoses:  Medication refill    Rx / DC Orders ED Discharge Orders         Ordered    RISPERDAL 1 MG tablet  Daily at bedtime        12/02/20 8185           Mare Ferrari, PA-C 12/02/20 6314    Jacalyn Lefevre, MD 12/02/20 1045

## 2020-12-03 ENCOUNTER — Emergency Department (HOSPITAL_COMMUNITY)
Admission: EM | Admit: 2020-12-03 | Discharge: 2020-12-03 | Disposition: A | Discharge status: Home / Self Care | Payer: Self-pay | Attending: Emergency Medicine | Admitting: Emergency Medicine

## 2020-12-03 ENCOUNTER — Encounter (HOSPITAL_COMMUNITY): Payer: Self-pay | Admitting: *Deleted

## 2020-12-03 ENCOUNTER — Other Ambulatory Visit: Payer: Self-pay

## 2020-12-03 DIAGNOSIS — F17210 Nicotine dependence, cigarettes, uncomplicated: Secondary | ICD-10-CM | POA: Insufficient documentation

## 2020-12-03 DIAGNOSIS — U071 COVID-19: Secondary | ICD-10-CM | POA: Insufficient documentation

## 2020-12-03 NOTE — ED Notes (Signed)
ED Provider at bedside. 

## 2020-12-03 NOTE — ED Provider Notes (Signed)
[name] EMERGENCY DEPARTMENT Provider Note   CSN: [id number] Arrival date & time: 12/03/20  0105     History Chief Complaint  Patient presents with  . Aspiration  . wants something that will get rid of covid sooner    Cole Barrett is a 35 y.o. male with history of homelessness, schizophrenia presents to the ED requesting medicine to help his Covid infection.  Patient was seen in the ED 2 days ago for behavioral/psych consult and was at that time tested for Covid.  He was notified that his Covid test had come back positive.  States he told his family that he had Covid and they got concerned and told him to come back to the hospital to see if he could get medicines to help the infection.  Has no symptoms of Covid other than loss of smell.  No fever, cough, chest pain, shortness of breath, vomiting, diarrhea, abdominal pain, changes to taste.  Interventions.  No modifying factors.  HPI     Past Medical History:  Diagnosis Date  . Homelessness   . Hypercholesterolemia   . Mental disorder   . Paranoid behavior (HCC)   . PE (pulmonary embolism)   . Schizophrenia [facility])     Patient Active Problem List   Diagnosis Date Noted  . Acute psychosis (HCC) 08/31/2019  . Schizophrenia (HCC) 08/31/2019  . Schizoaffective disorder, bipolar type (HCC) 01/19/2019  . Alcohol abuse   . Auditory hallucinations   . Alcohol abuse with alcohol-induced mood disorder (HCC) 09/08/2016  . Homelessness 09/03/2016  . Person feigning illness 09/03/2016  . 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 Cannabis use disorder, mild, abuse 08/29/2016  . Pulmonary emboli (HCC) 07/13/2016  . Adjustment disorder with emotional disturbance 03/12/2014  . 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 Acute left flank pain 01/02/2014  . Pulmonary embolism, bilateral (HCC) 01/02/2014    History reviewed. No pertinent surgical history.     Family History  Problem  Relation Age of Onset  . Hypertension Mother     Social History   Tobacco Use  . Smoking status: Current Every Day Smoker    Packs/day: 0.50    Types: Cigarettes  . Smokeless tobacco: Never Used  Vaping Use  . Vaping Use: Never used  Substance Use Topics  . Alcohol use: Yes    Comment: BAC not available  . Drug use: Yes    Types: Marijuana    Comment: Last used: 2 months ago UDS NA    Home Medications Prior to Admission medications   Medication Sig Start Date End Date Taking? Authorizing Provider  RISPERDAL 1 MG tablet Take 1 tablet (1 mg total) by mouth at bedtime. 12/02/20   Mare Ferrari, PA-C  amantadine (SYMMETREL) 100 MG capsule Take 1 capsule (100 mg total) by mouth 2 (two) times daily. Patient not taking: Reported on 11/04/2019 09/01/19 11/17/19  [name], NP    Allergies    Haldol [haloperidol]  Review of Systems   Review of Systems  All other systems reviewed and are negative.   Physical Exam Updated Vital Signs BP 118/69   Pulse 68   Temp 98.2 F (36.8 C) (Oral)   Resp 15   SpO2 99%   Physical Exam Constitutional:      Appearance: He is well-developed.  HENT:     Head: Normocephalic.     Nose: Nose normal.  Eyes:     General: Lids are normal.  Cardiovascular:  Rate and Rhythm: Normal rate.  Pulmonary:     Effort: Pulmonary effort is normal. No respiratory distress.  Musculoskeletal:        General: Normal range of motion.     Cervical back: Normal range of motion.  Neurological:     Mental Status: He is alert.  Psychiatric:        Behavior: Behavior normal.     ED Results / Procedures / Treatments   Labs (all labs ordered are listed, but only abnormal results are displayed) Labs Reviewed - No data to display  EKG None  Radiology No results found.  Procedures Procedures (including critical care time)  Medications Ordered in ED Medications - No data to display  ED Course  I have reviewed the triage vital signs and  the nursing notes.  Pertinent labs & imaging results that were available during my care of the patient were reviewed by me and considered in my medical decision making (see chart for details).    MDM Rules/Calculators/A&P                          35 year old male with history of schizophrenia, homelessness presents to the ED requesting medicine to help his Covid infection.  Reports his family is concerned and they recommended he come to the hospital to see if they can given medicines for COVID.  Patient is asymptomatic other than loss of smell.  No red flags like fever, chest pain, shortness of breath.  Vital signs are normal.  I discussed case with antibody infusion team via private message and they report patient does not meet criteria.  BMI is 31.  There have been changes to coughing factors for monoclonal antibody infusion recently due to severe shortages.  Explained this to patient.  Discussed symptomatic over-the-counter management for any symptoms if they arise.  Return precautions given.  Appropriate for discharge.  Final Clinical Impression(s) / ED Diagnoses Final diagnoses:  COVID-19 virus infection    Rx / DC Orders ED Discharge Orders    None       Jerrell Mylar 12/03/20 1151    Benjiman Core, MD 12/03/20 1559

## 2020-12-03 NOTE — ED Triage Notes (Signed)
The pt tested pos for covid 3-4 days ago here.  He is back here tonight with no symptoms  To get something that wil  Speed the covid along  To get rid of it sooner

## 2020-12-03 NOTE — Discharge Instructions (Signed)
Treatment of your illness and symptoms for now will include self-isolation, monitoring of symptoms and supportive care with over-the-counter medicines.    Stay well-hydrated. Rest. You can use over the counter medications to help with symptoms: [phone] mg acetaminophen (tylenol) every 6 hours, around the clock to help with associated fevers, sore throat, headaches, generalized body aches and malaise.  Oxymetazoline (afrin) intranasal spray once daily for no more than 3 days to help with congestion, after 3 days you can switch to another over-the-counter nasal steroid spray such as fluticasone (flonase) Allergy medication (loratadine, cetirizine, etc) and phenylephrine (sudafed) help with nasal congestion, runny nose and postnasal drip.   Dextromethorphan (Delsym) to suppress dry cough. Frequent coughing is likely causing your chest wall pain Guaifenesin (Mucinex) to help expectorate mucus and cough Wash your hands often to prevent spread.  Stay hydrated with plenty of clear fluids Rest   Return to the ED if symptoms are worsening or severe, there is increased work of breathing, chest pain or shortness of breath with exertion or activity, inability to tolerate fluids due to persistent vomiting despite nausea medicines, passing out, light headedness.  If your test results are POSITIVE, the following isolation requirements need to be met to return to work and resume essential activities: At least 10 days since symptom onset  72 hours of absence of fever without antifever medicine (ibuprofen, acetaminophen). A fever is temperature of 100.63F or greater. Improvement of respiratory symptoms  There is no medicine that can be given to you to help with COVID infection.  You do not meet infusion criteria

## 2020-12-04 ENCOUNTER — Encounter (HOSPITAL_COMMUNITY): Payer: Self-pay

## 2020-12-04 ENCOUNTER — Emergency Department (HOSPITAL_COMMUNITY)
Admission: EM | Admit: 2020-12-04 | Discharge: 2020-12-04 | Disposition: A | Discharge status: Home / Self Care | Payer: Self-pay | Attending: Emergency Medicine | Admitting: Emergency Medicine

## 2020-12-04 ENCOUNTER — Other Ambulatory Visit: Payer: Self-pay

## 2020-12-04 ENCOUNTER — Encounter (HOSPITAL_COMMUNITY): Payer: Self-pay | Admitting: Emergency Medicine

## 2020-12-04 DIAGNOSIS — F17210 Nicotine dependence, cigarettes, uncomplicated: Secondary | ICD-10-CM | POA: Insufficient documentation

## 2020-12-04 DIAGNOSIS — Z760 Encounter for issue of repeat prescription: Secondary | ICD-10-CM

## 2020-12-04 DIAGNOSIS — Z79899 Other long term (current) drug therapy: Secondary | ICD-10-CM | POA: Insufficient documentation

## 2020-12-04 DIAGNOSIS — Z5321 Procedure and treatment not carried out due to patient leaving prior to being seen by health care provider: Secondary | ICD-10-CM | POA: Insufficient documentation

## 2020-12-04 DIAGNOSIS — U071 COVID-19: Secondary | ICD-10-CM | POA: Insufficient documentation

## 2020-12-04 HISTORY — DX: COVID-19: U07.1

## 2020-12-04 MED ORDER — RISPERDAL 1 MG PO TABS: 1.0000 mg | ORAL_TABLET | Freq: Every day | ORAL | 0 refills | Status: DC

## 2020-12-04 MED ORDER — ALBUTEROL SULFATE HFA 108 (90 BASE) MCG/ACT IN AERS
2.0000 | INHALATION_SPRAY | Freq: Once | RESPIRATORY_TRACT | Status: AC
  Administered 2020-12-04: 2 via RESPIRATORY_TRACT
  Filled 2020-12-04: qty 6.7

## 2020-12-04 MED ORDER — RISPERIDONE 1 MG PO TABS
1.0000 mg | ORAL_TABLET | Freq: Once | ORAL | Status: AC
  Administered 2020-12-04: 1 mg via ORAL
  Filled 2020-12-04 (×2): qty 1

## 2020-12-04 NOTE — ED Triage Notes (Signed)
Pt request Risperdal refill. Pt also reports he is in no hurry to be seen.

## 2020-12-04 NOTE — ED Triage Notes (Signed)
Patient requesting psychiatric treatment for his auditory hallucinations , denies SI or HI , tested positive for Covid19 this week , respirations unlabored /no fever .

## 2020-12-04 NOTE — Discharge Instructions (Addendum)
Take Tylenol or aspirin for fevers and body aches. Use the albuterol inhaler be provided if you are having chest tightness or shortness of breath.  Return to the ER if your symptoms get worse, specifically start having trouble breathing, confusion, dizziness, inability to keep any food down.

## 2020-12-04 NOTE — ED Provider Notes (Signed)
[name] EMERGENCY DEPARTMENT Provider Note   CSN: [id number] Arrival date & time: 12/04/20  0423     History Chief Complaint  Patient presents with  . Hallucinations    Covid+    Cole Barrett is a 35 y.o. male.  HPI    35 year old man diagnosed with COVID-19 recently, who also has mental illness comes in a chief complaint of hallucinations.  Patient states that he just needs his medication.  I asked him if he picked up his risperidone that was sent from the ER recently, to which patient said no.  Triage note reports that patient's family sent him here to expedite recovery from COVID-19.  He had similar complaints on his prior visit after he was diagnosed with COVID-19, and was informed that there is no definitive cure for COVID-19.  Past Medical History:  Diagnosis Date  . COVID-19   . Homelessness   . Hypercholesterolemia   . Mental disorder   . Paranoid behavior (HCC)   . PE (pulmonary embolism)   . Schizophrenia [facility])     Patient Active Problem List   Diagnosis Date Noted  . Acute psychosis (HCC) 08/31/2019  . Schizophrenia (HCC) 08/31/2019  . Schizoaffective disorder, bipolar type (HCC) 01/19/2019  . Alcohol abuse   . Auditory hallucinations   . Alcohol abuse with alcohol-induced mood disorder (HCC) 09/08/2016  . Homelessness 09/03/2016  . Person feigning illness 09/03/2016  . 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 Cannabis use disorder, mild, abuse 08/29/2016  . Pulmonary emboli (HCC) 07/13/2016  . Adjustment disorder with emotional disturbance 03/12/2014  . 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 Acute left flank pain 01/02/2014  . Pulmonary embolism, bilateral (HCC) 01/02/2014    History reviewed. No pertinent surgical history.     Family History  Problem Relation Age of Onset  . Hypertension Mother     Social History   Tobacco Use  . Smoking status: Current Every Day Smoker    Packs/day:  0.50    Types: Cigarettes  . Smokeless tobacco: Never Used  Vaping Use  . Vaping Use: Never used  Substance Use Topics  . Alcohol use: Yes    Comment: BAC not available  . Drug use: Yes    Types: Marijuana    Comment: Last used: 2 months ago UDS NA    Home Medications Prior to Admission medications   Medication Sig Start Date End Date Taking? Authorizing Provider  RISPERDAL 1 MG tablet Take 1 tablet (1 mg total) by mouth at bedtime. 12/04/20   Derwood Kaplan, MD  amantadine (SYMMETREL) 100 MG capsule Take 1 capsule (100 mg total) by mouth 2 (two) times daily. Patient not taking: Reported on 11/04/2019 09/01/19 11/17/19  [name], NP    Allergies    Haldol [haloperidol]  Review of Systems   Review of Systems  Constitutional: Negative for activity change.  Respiratory: Negative for shortness of breath.   Gastrointestinal: Negative for vomiting.  Psychiatric/Behavioral: Positive for hallucinations. Negative for suicidal ideas.    Physical Exam Updated Vital Signs BP (!) 147/95 (BP Location: Left Wrist)   Pulse 62   Temp 98.4 F (36.9 C) (Oral)   Resp 16   SpO2 100%   Physical Exam Vitals and nursing note reviewed.  Constitutional:      Appearance: He is well-developed.  HENT:     Head: Atraumatic.  Cardiovascular:     Rate and Rhythm: Normal rate.  Pulmonary:  Effort: Pulmonary effort is normal.  Musculoskeletal:     Cervical back: Neck supple.  Skin:    General: Skin is warm.  Neurological:     Mental Status: He is alert and oriented to person, place, and time.  Psychiatric:        Behavior: Behavior normal.     ED Results / Procedures / Treatments   Labs (all labs ordered are listed, but only abnormal results are displayed) Labs Reviewed  COMPREHENSIVE METABOLIC PANEL  ETHANOL  CBC  RAPID URINE DRUG SCREEN, HOSP PERFORMED    EKG None  Radiology No results found.  Procedures Procedures (including critical care time)  Medications  Ordered in ED Medications  risperiDONE (RISPERDAL) tablet 1 mg (has no administration in time range)  albuterol (VENTOLIN HFA) 108 (90 Base) MCG/ACT inhaler 2 puff (has no administration in time range)    ED Course  I have reviewed the triage vital signs and the nursing notes.  Pertinent labs & imaging results that were available during my care of the patient were reviewed by me and considered in my medical decision making (see chart for details).    MDM Rules/Calculators/A&P                          Cole Barrett was evaluated in Emergency Department on 12/04/2020 for the symptoms described in the history of present illness. He was evaluated in the context of the global COVID-19 pandemic, which necessitated consideration that the patient might be at risk for infection with the SARS-CoV-2 virus that causes COVID-19. Institutional protocols and algorithms that pertain to the evaluation of patients at risk for COVID-19 are in a state of rapid change based on information released by regulatory bodies including the CDC and federal and state organizations. These policies and algorithms were followed during the patient's care in the ED.   35 year old comes in a chief complaint of hallucinations.  He also wants opinion on COVID-19 treatment.  Patient has no SI, he is not demonstrating high risk behavior for injury to self or others.  Patient is not decompensated with his schizophrenia.  Risperdal prescription provided. Advised that there is no definitive cure for Covid, and to take Tylenol or aspirin for aches and fevers and we will provide him with an inhaler that he can take home and use for shortness of breath.  Final Clinical Impression(s) / ED Diagnoses Final diagnoses:  COVID-19  Medication refill    Rx / DC Orders ED Discharge Orders         Ordered    RISPERDAL 1 MG tablet  Daily at bedtime        12/04/20 0755           Derwood Kaplan, MD 12/04/20 [phone]

## 2020-12-05 ENCOUNTER — Other Ambulatory Visit: Payer: Self-pay

## 2020-12-05 ENCOUNTER — Emergency Department (HOSPITAL_COMMUNITY)
Admission: EM | Admit: 2020-12-05 | Discharge: 2020-12-06 | Disposition: A | Discharge status: Home / Self Care | Payer: Self-pay | Attending: Emergency Medicine | Admitting: Emergency Medicine

## 2020-12-05 ENCOUNTER — Emergency Department (HOSPITAL_COMMUNITY)
Admission: EM | Admit: 2020-12-05 | Discharge: 2020-12-05 | Disposition: A | Discharge status: Home / Self Care | Payer: Self-pay | Attending: Emergency Medicine | Admitting: Emergency Medicine

## 2020-12-05 DIAGNOSIS — Z7689 Persons encountering health services in other specified circumstances: Secondary | ICD-10-CM

## 2020-12-05 DIAGNOSIS — Z0289 Encounter for other administrative examinations: Secondary | ICD-10-CM | POA: Insufficient documentation

## 2020-12-05 DIAGNOSIS — F17210 Nicotine dependence, cigarettes, uncomplicated: Secondary | ICD-10-CM | POA: Insufficient documentation

## 2020-12-06 ENCOUNTER — Encounter (HOSPITAL_COMMUNITY): Payer: Self-pay

## 2020-12-06 ENCOUNTER — Other Ambulatory Visit: Payer: Self-pay

## 2020-12-06 DIAGNOSIS — F17210 Nicotine dependence, cigarettes, uncomplicated: Secondary | ICD-10-CM | POA: Insufficient documentation

## 2020-12-06 DIAGNOSIS — Z79899 Other long term (current) drug therapy: Secondary | ICD-10-CM | POA: Insufficient documentation

## 2020-12-06 DIAGNOSIS — U071 COVID-19: Secondary | ICD-10-CM | POA: Insufficient documentation

## 2020-12-06 MED ORDER — RISPERIDONE 1 MG PO TABS
1.0000 mg | ORAL_TABLET | Freq: Once | ORAL | Status: AC
  Administered 2020-12-06: 1 mg via ORAL
  Filled 2020-12-06: qty 1

## 2020-12-06 NOTE — ED Triage Notes (Signed)
Pt presents to ED POv. Pt needs medication refill

## 2020-12-06 NOTE — ED Triage Notes (Signed)
Pt requesting a covid swab to see if it is negative.

## 2020-12-06 NOTE — Discharge Instructions (Signed)
Make sure you take your medication as prescribed.

## 2020-12-06 NOTE — ED Provider Notes (Signed)
[name] EMERGENCY DEPARTMENT Provider Note   CSN: [id number] Arrival date & time: 12/05/20  2319   History Chief Complaint  Patient presents with  . Medication Refill    Cole Barrett is a 35 y.o. male.  The history is provided by the patient.  Medication Refill He has history of hyperlipidemia, schizophrenia, pulmonary embolism, homelessness and comes in because he needs to take his nighttime dose of risperidone.  He states that the pills are at at home which she does not have immediate access to any wants to make sure he does not miss a dose.  Past Medical History:  Diagnosis Date  . COVID-19   . Homelessness   . Hypercholesterolemia   . Mental disorder   . Paranoid behavior (HCC)   . PE (pulmonary embolism)   . Schizophrenia [facility])     Patient Active Problem List   Diagnosis Date Noted  . Acute psychosis (HCC) 08/31/2019  . Schizophrenia (HCC) 08/31/2019  . Schizoaffective disorder, bipolar type (HCC) 01/19/2019  . Alcohol abuse   . Auditory hallucinations   . Alcohol abuse with alcohol-induced mood disorder (HCC) 09/08/2016  . Homelessness 09/03/2016  . Person feigning illness 09/03/2016  . 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 Cannabis use disorder, mild, abuse 08/29/2016  . Pulmonary emboli (HCC) 07/13/2016  . Adjustment disorder with emotional disturbance 03/12/2014  . 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 Acute left flank pain 01/02/2014  . Pulmonary embolism, bilateral (HCC) 01/02/2014    No past surgical history on file.     Family History  Problem Relation Age of Onset  . Hypertension Mother     Social History   Tobacco Use  . Smoking status: Current Every Day Smoker    Packs/day: 0.50    Types: Cigarettes  . Smokeless tobacco: Never Used  Vaping Use  . Vaping Use: Never used  Substance Use Topics  . Alcohol use: Yes    Comment: BAC not available  . Drug use: Yes    Types: Marijuana     Comment: Last used: 2 months ago UDS NA    Home Medications Prior to Admission medications   Medication Sig Start Date End Date Taking? Authorizing Provider  RISPERDAL 1 MG tablet Take 1 tablet (1 mg total) by mouth at bedtime. 12/04/20   Derwood Kaplan, MD  amantadine (SYMMETREL) 100 MG capsule Take 1 capsule (100 mg total) by mouth 2 (two) times daily. Patient not taking: Reported on 11/04/2019 09/01/19 11/17/19  [name], NP    Allergies    Haldol [haloperidol]  Review of Systems   Review of Systems  All other systems reviewed and are negative.   Physical Exam Updated Vital Signs BP 132/73 (BP Location: Right Arm)   Pulse 76   Temp 98.2 F (36.8 C) (Oral)   Resp 18   SpO2 100%   Physical Exam Vitals and nursing note reviewed.   35 year old male, resting comfortably and in no acute distress. Vital signs are normal. Oxygen saturation is 100%, which is normal. Head is normocephalic and atraumatic. PERRLA, EOMI. Oropharynx is clear. Neck is nontender and supple without adenopathy or JVD. Back is nontender and there is no CVA tenderness. Lungs are clear without rales, wheezes, or rhonchi. Chest is nontender. Heart has regular rate and rhythm without murmur. Abdomen is soft, flat, nontender without masses or hepatosplenomegaly and peristalsis is normoactive. Extremities have no cyanosis or edema, full range of motion is present.  Skin is warm and dry without rash. Neurologic: Mental status is normal, cranial nerves are intact, there are no motor or sensory deficits.  ED Results / Procedures / Treatments    Procedures Procedures (including critical care time)  Medications Ordered in ED Medications  risperiDONE (RISPERDAL) tablet 1 mg (has no administration in time range)    ED Course  I have reviewed the triage vital signs and the nursing notes.  MDM Rules/Calculators/A&P History of schizophrenia, needs to take his scheduled dose of risperidone.  Dose is  given in the emergency department and he is discharged.  Old records were reviewed, and he has multiple ED visits including several where he came in just to get his nighttime dose of risperidone.  He has 42 ED visits in the last 6 months.  Final Clinical Impression(s) / ED Diagnoses Final diagnoses:  Encounter for medication administration    Rx / DC Orders ED Discharge Orders    None       [name], MD 12/06/20 [phone]

## 2020-12-07 ENCOUNTER — Emergency Department (HOSPITAL_COMMUNITY)
Admission: EM | Admit: 2020-12-07 | Discharge: 2020-12-07 | Disposition: A | Discharge status: Home / Self Care | Payer: Self-pay | Attending: Emergency Medicine | Admitting: Emergency Medicine

## 2020-12-07 DIAGNOSIS — U071 COVID-19: Secondary | ICD-10-CM

## 2020-12-07 NOTE — Discharge Instructions (Signed)
There is no reason for [place] to retest you for COVID-19.  You will likely test positive for several weeks even though you are not having symptoms.  You are beyond your quarantine and should no longer be infectious to others.  Return to the emergency department if you experience any new and/or concerning symptoms.

## 2020-12-07 NOTE — ED Provider Notes (Signed)
[name] Provider Note   CSN: [id number] Arrival date & time: 12/06/20  2211     History Chief Complaint  Patient presents with  . Covid Test    Cole Barrett is a 35 y.o. male.  Patient is a 35 year old male with history of schizophrenia and recent diagnosis of COVID-19.  He tested positive on January 4.  He presents today requesting a repeat test to see if he still has the virus.  Patient tells me his family has encouraged him to have this done.  He is currently asymptomatic.  The history is provided by the patient.       Past Medical History:  Diagnosis Date  . COVID-19   . Homelessness   . Hypercholesterolemia   . Mental disorder   . Paranoid behavior (HCC)   . PE (pulmonary embolism)   . Schizophrenia [facility])     Patient Active Problem List   Diagnosis Date Noted  . Acute psychosis (HCC) 08/31/2019  . Schizophrenia (HCC) 08/31/2019  . Schizoaffective disorder, bipolar type (HCC) 01/19/2019  . Alcohol abuse   . Auditory hallucinations   . Alcohol abuse with alcohol-induced mood disorder (HCC) 09/08/2016  . Homelessness 09/03/2016  . Person feigning illness 09/03/2016  . 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 Cannabis use disorder, mild, abuse 08/29/2016  . Pulmonary emboli (HCC) 07/13/2016  . Adjustment disorder with emotional disturbance 03/12/2014  . 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 Acute left flank pain 01/02/2014  . Pulmonary embolism, bilateral (HCC) 01/02/2014    History reviewed. No pertinent surgical history.     Family History  Problem Relation Age of Onset  . Hypertension Mother     Social History   Tobacco Use  . Smoking status: Current Every Day Smoker    Packs/day: 0.50    Types: Cigarettes  . Smokeless tobacco: Never Used  Vaping Use  . Vaping Use: Never used  Substance Use Topics  . Alcohol use: Yes    Comment: BAC not available  . Drug use: Yes     Types: Marijuana    Comment: Last used: 2 months ago UDS NA    Home Medications Prior to Admission medications   Medication Sig Start Date End Date Taking? Authorizing Provider  RISPERDAL 1 MG tablet Take 1 tablet (1 mg total) by mouth at bedtime. 12/04/20   Derwood Kaplan, MD  amantadine (SYMMETREL) 100 MG capsule Take 1 capsule (100 mg total) by mouth 2 (two) times daily. Patient not taking: Reported on 11/04/2019 09/01/19 11/17/19  [name], NP    Allergies    Haldol [haloperidol]  Review of Systems   Review of Systems  All other systems reviewed and are negative.   Physical Exam Updated Vital Signs BP (!) 157/89 (BP Location: Left Arm)   Pulse 76   Temp 98.7 F (37.1 C) (Oral)   Resp 16   SpO2 98%   Physical Exam Vitals and nursing note reviewed.  Constitutional:      General: He is not in acute distress.    Appearance: He is well-developed and well-nourished. He is not diaphoretic.  HENT:     Head: Normocephalic and atraumatic.     Mouth/Throat:     Mouth: Oropharynx is clear and moist.  Cardiovascular:     Rate and Rhythm: Normal rate and regular rhythm.     Heart sounds: No murmur heard. No friction rub.  Pulmonary:     Effort: Pulmonary effort  is normal. No respiratory distress.     Breath sounds: Normal breath sounds. No wheezing or rales.  Abdominal:     General: Bowel sounds are normal. There is no distension.     Palpations: Abdomen is soft.     Tenderness: There is no abdominal tenderness.  Musculoskeletal:        General: No edema. Normal range of motion.     Cervical back: Normal range of motion and neck supple.  Skin:    General: Skin is warm and dry.  Neurological:     Mental Status: He is alert and oriented to person, place, and time.     Coordination: Coordination normal.     ED Results / Procedures / Treatments   Labs (all labs ordered are listed, but only abnormal results are displayed) Labs Reviewed - No data to  display  EKG None  Radiology No results found.  Procedures Procedures (including critical care time)  Medications Ordered in ED Medications - No data to display  ED Course  I have reviewed the triage vital signs and the nursing notes.  Pertinent labs & imaging results that were available during my care of the patient were reviewed by me and considered in my medical decision making (see chart for details).    MDM Rules/Calculators/A&P  Patient presented requesting a repeat COVID test after his test 6 days ago was positive.  He is asymptomatic.  I see no indication to perform this test.  Patient educated he will likely test positive for several weeks even though he is not exhibiting symptoms.  Patient to be discharged with as needed return.  Final Clinical Impression(s) / ED Diagnoses Final diagnoses:  None    Rx / DC Orders ED Discharge Orders    None       Geoffery Lyons, MD 12/07/20 0127

## 2020-12-08 ENCOUNTER — Other Ambulatory Visit: Payer: Self-pay

## 2020-12-08 ENCOUNTER — Emergency Department (HOSPITAL_COMMUNITY)
Admission: EM | Admit: 2020-12-08 | Discharge: 2020-12-08 | Disposition: A | Discharge status: Home / Self Care | Payer: Self-pay | Attending: Emergency Medicine | Admitting: Emergency Medicine

## 2020-12-08 ENCOUNTER — Encounter (HOSPITAL_COMMUNITY): Payer: Self-pay | Admitting: Emergency Medicine

## 2020-12-08 DIAGNOSIS — U071 COVID-19: Secondary | ICD-10-CM | POA: Insufficient documentation

## 2020-12-08 DIAGNOSIS — Z5321 Procedure and treatment not carried out due to patient leaving prior to being seen by health care provider: Secondary | ICD-10-CM | POA: Insufficient documentation

## 2020-12-08 NOTE — ED Triage Notes (Signed)
Patient tested positive for Covid19 last 12/01/20 , requesting Covid test again . Respirations unlabored/afebrile.

## 2020-12-08 NOTE — ED Notes (Signed)
Informed staff leaving has to go to work

## 2020-12-09 ENCOUNTER — Emergency Department (HOSPITAL_COMMUNITY): Admission: EM | Admit: 2020-12-09 | Discharge: 2020-12-09 | Discharge status: Against Medical Advice | Payer: Self-pay

## 2020-12-10 ENCOUNTER — Encounter (HOSPITAL_COMMUNITY): Payer: Self-pay

## 2020-12-10 ENCOUNTER — Emergency Department (HOSPITAL_COMMUNITY)
Admission: EM | Admit: 2020-12-10 | Discharge: 2020-12-10 | Disposition: A | Discharge status: Home / Self Care | Payer: Self-pay | Attending: Emergency Medicine | Admitting: Emergency Medicine

## 2020-12-10 DIAGNOSIS — Z8616 Personal history of COVID-19: Secondary | ICD-10-CM | POA: Insufficient documentation

## 2020-12-10 DIAGNOSIS — Z760 Encounter for issue of repeat prescription: Secondary | ICD-10-CM | POA: Insufficient documentation

## 2020-12-10 DIAGNOSIS — F209 Schizophrenia, unspecified: Secondary | ICD-10-CM | POA: Insufficient documentation

## 2020-12-10 DIAGNOSIS — F17210 Nicotine dependence, cigarettes, uncomplicated: Secondary | ICD-10-CM | POA: Insufficient documentation

## 2020-12-10 DIAGNOSIS — Z79899 Other long term (current) drug therapy: Secondary | ICD-10-CM

## 2020-12-10 MED ORDER — RISPERIDONE 1 MG PO TABS
1.0000 mg | ORAL_TABLET | Freq: Once | ORAL | Status: AC
  Administered 2020-12-10: 1 mg via ORAL
  Filled 2020-12-10: qty 1

## 2020-12-10 NOTE — Discharge Instructions (Addendum)
You were seen today requesting your nighttime medications.  It is very important that you keep your medications with you and get them filled as an outpatient.

## 2020-12-10 NOTE — ED Provider Notes (Signed)
[name] EMERGENCY DEPARTMENT Provider Note   CSN: [id number] Arrival date & time: 12/10/20  0024     History Chief Complaint  Patient presents with  . Medication Refill    Cole Barrett is a 35 y.o. male.  HPI     This is a 35 year old male with history of homelessness, paranoid schizophrenia who is well-known to our emergency department.  He is here requesting a dose of his Risperdal.  He states that he was on the side of town and did not bring his medications.  He comes emergency room fairly frequently for his medicines.  He states he has medications at home he just does not have them with him.  He does not have any physical complaints at this time.  I discussed with him that he needs to make sure that he is using the emergency room appropriately and medication refills is not appropriate.  Past Medical History:  Diagnosis Date  . COVID-19   . Homelessness   . Hypercholesterolemia   . Mental disorder   . Paranoid behavior (HCC)   . PE (pulmonary embolism)   . Schizophrenia [facility])     Patient Active Problem List   Diagnosis Date Noted  . Acute psychosis (HCC) 08/31/2019  . Schizophrenia (HCC) 08/31/2019  . Schizoaffective disorder, bipolar type (HCC) 01/19/2019  . Alcohol abuse   . Auditory hallucinations   . Alcohol abuse with alcohol-induced mood disorder (HCC) 09/08/2016  . Homelessness 09/03/2016  . Person feigning illness 09/03/2016  . 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 Cannabis use disorder, mild, abuse 08/29/2016  . Pulmonary emboli (HCC) 07/13/2016  . Adjustment disorder with emotional disturbance 03/12/2014  . 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 Acute left flank pain 01/02/2014  . Pulmonary embolism, bilateral (HCC) 01/02/2014    History reviewed. No pertinent surgical history.     Family History  Problem Relation Age of Onset  . Hypertension Mother     Social History   Tobacco Use  .  Smoking status: Current Every Day Smoker    Packs/day: 0.50    Types: Cigarettes  . Smokeless tobacco: Never Used  Vaping Use  . Vaping Use: Never used  Substance Use Topics  . Alcohol use: Yes    Comment: BAC not available  . Drug use: Yes    Types: Marijuana    Comment: Last used: 2 months ago UDS NA    Home Medications Prior to Admission medications   Medication Sig Start Date End Date Taking? Authorizing Provider  RISPERDAL 1 MG tablet Take 1 tablet (1 mg total) by mouth at bedtime. 12/04/20   Derwood Kaplan, MD  amantadine (SYMMETREL) 100 MG capsule Take 1 capsule (100 mg total) by mouth 2 (two) times daily. Patient not taking: Reported on 11/04/2019 09/01/19 11/17/19  [name], NP    Allergies    Haldol [haloperidol]  Review of Systems   Review of Systems  Constitutional: Negative for fever.  Cardiovascular: Negative for chest pain.  Gastrointestinal: Negative for abdominal pain.  Genitourinary: Negative for dysuria.  All other systems reviewed and are negative.   Physical Exam Updated Vital Signs BP 120/81 (BP Location: Right Arm)   Pulse 69   Temp 98.2 F (36.8 C) (Oral)   Resp 18   SpO2 98%   Physical Exam Vitals and nursing note reviewed.  Constitutional:      Appearance: He is well-developed and well-nourished. He is not ill-appearing.  HENT:  Head: Normocephalic and atraumatic.     Mouth/Throat:     Mouth: Mucous membranes are moist.  Eyes:     Pupils: Pupils are equal, round, and reactive to light.  Cardiovascular:     Rate and Rhythm: Normal rate and regular rhythm.  Pulmonary:     Effort: Pulmonary effort is normal. No respiratory distress.  Abdominal:     Palpations: Abdomen is soft.  Musculoskeletal:        General: No edema.  Skin:    General: Skin is warm and dry.  Neurological:     Mental Status: He is alert and oriented to person, place, and time.  Psychiatric:        Mood and Affect: Mood and affect and mood normal.      ED Results / Procedures / Treatments   Labs (all labs ordered are listed, but only abnormal results are displayed) Labs Reviewed - No data to display  EKG None  Radiology No results found.  Procedures Procedures (including critical care time)  Medications Ordered in ED Medications  risperiDONE (RISPERDAL) tablet 1 mg (has no administration in time range)    ED Course  I have reviewed the triage vital signs and the nursing notes.  Pertinent labs & imaging results that were available during my care of the patient were reviewed by me and considered in my medical decision making (see chart for details).    MDM Rules/Calculators/A&P                          Patient presents requesting medication administration.  Discussed with him appropriate use of the emergency department.  He was given 1 mg of Risperdal and was discharged.  He appears to be at his baseline without any other emergent conditions at this time  After history, exam, and medical workup I feel the patient has been appropriately medically screened and is safe for discharge home. Pertinent diagnoses were discussed with the patient. Patient was given return precautions.  Final Clinical Impression(s) / ED Diagnoses Final diagnoses:  Medication management    Rx / DC Orders ED Discharge Orders    None       Amrie Gurganus, Mayer Masker, MD 12/10/20 [phone]

## 2020-12-10 NOTE — ED Triage Notes (Signed)
Pt requesting his night dose of Risperdal.

## 2020-12-12 ENCOUNTER — Emergency Department (HOSPITAL_COMMUNITY)
Admission: EM | Admit: 2020-12-12 | Discharge: 2020-12-12 | Disposition: A | Discharge status: Home / Self Care | Payer: Self-pay | Attending: Emergency Medicine | Admitting: Emergency Medicine

## 2020-12-12 ENCOUNTER — Other Ambulatory Visit: Payer: Self-pay

## 2020-12-12 ENCOUNTER — Encounter (HOSPITAL_COMMUNITY): Payer: Self-pay | Admitting: *Deleted

## 2020-12-12 DIAGNOSIS — F17210 Nicotine dependence, cigarettes, uncomplicated: Secondary | ICD-10-CM | POA: Insufficient documentation

## 2020-12-12 DIAGNOSIS — Z760 Encounter for issue of repeat prescription: Secondary | ICD-10-CM | POA: Insufficient documentation

## 2020-12-12 DIAGNOSIS — Z8616 Personal history of COVID-19: Secondary | ICD-10-CM | POA: Insufficient documentation

## 2020-12-12 DIAGNOSIS — F209 Schizophrenia, unspecified: Secondary | ICD-10-CM | POA: Insufficient documentation

## 2020-12-12 MED ORDER — RISPERIDONE 1 MG PO TABS
1.0000 mg | ORAL_TABLET | Freq: Once | ORAL | Status: AC
  Administered 2020-12-12: 1 mg via ORAL
  Filled 2020-12-12 (×2): qty 1

## 2020-12-12 MED ORDER — RISPERDAL 1 MG PO TABS
1.0000 mg | ORAL_TABLET | Freq: Every day | ORAL | 0 refills | Status: DC
Start: 2020-12-12 — End: 2020-12-27

## 2020-12-12 NOTE — ED Provider Notes (Signed)
[name] EMERGENCY DEPARTMENT Provider Note   CSN: [id number] Arrival date & time: 12/12/20  0230     History Chief Complaint  Patient presents with  . Medication Refill    Cole Barrett is a 35 y.o. male presenting for med refill.  Patient states he ran out of his Risperdal last night.  He has not had his dose in the past 24 hours.  He is requesting a dose now and a refill until he can follow-up with his behavioral health doctor.  He has no other complaints.  No SI, HI, or AVH.  No fevers, chills, or pain.  Additional history taken chart reviewed.  Patient seen frequently in the ER for med refill, 7 times in the past 15 days.  He was recently diagnosed with COVID, however is beyond the quarantine window.  History of paranoid behavior, mental disorder, schizophrenia.  HPI     Past Medical History:  Diagnosis Date  . COVID-19   . Homelessness   . Hypercholesterolemia   . Mental disorder   . Paranoid behavior (HCC)   . PE (pulmonary embolism)   . Schizophrenia [facility])     Patient Active Problem List   Diagnosis Date Noted  . Acute psychosis (HCC) 08/31/2019  . Schizophrenia (HCC) 08/31/2019  . Schizoaffective disorder, bipolar type (HCC) 01/19/2019  . Alcohol abuse   . Auditory hallucinations   . Alcohol abuse with alcohol-induced mood disorder (HCC) 09/08/2016  . Homelessness 09/03/2016  . Person feigning illness 09/03/2016  . 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 Cannabis use disorder, mild, abuse 08/29/2016  . Pulmonary emboli (HCC) 07/13/2016  . Adjustment disorder with emotional disturbance 03/12/2014  . 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 Acute left flank pain 01/02/2014  . Pulmonary embolism, bilateral (HCC) 01/02/2014    History reviewed. No pertinent surgical history.     Family History  Problem Relation Age of Onset  . Hypertension Mother     Social History   Tobacco Use  . Smoking status:  Current Every Day Smoker    Packs/day: 0.50    Types: Cigarettes  . Smokeless tobacco: Never Used  Vaping Use  . Vaping Use: Never used  Substance Use Topics  . Alcohol use: Yes    Comment: BAC not available  . Drug use: Yes    Types: Marijuana    Comment: Last used: 2 months ago UDS NA    Home Medications Prior to Admission medications   Medication Sig Start Date End Date Taking? Authorizing Provider  RISPERDAL 1 MG tablet Take 1 tablet (1 mg total) by mouth at bedtime. 12/12/20   Markhi Kleckner, PA-C  amantadine (SYMMETREL) 100 MG capsule Take 1 capsule (100 mg total) by mouth 2 (two) times daily. Patient not taking: Reported on 11/04/2019 09/01/19 11/17/19  [name], NP    Allergies    Haldol [haloperidol]  Review of Systems   Review of Systems  Constitutional: Negative for fever.  Psychiatric/Behavioral: Negative for self-injury and suicidal ideas.    Physical Exam Updated Vital Signs BP 119/64 (BP Location: Right Arm)   Pulse 80   Temp 98.7 F (37.1 C) (Oral)   Resp 18   SpO2 97%   Physical Exam Vitals and nursing note reviewed.  Constitutional:      General: He is not in acute distress.    Appearance: He is well-developed and well-nourished.     Comments: nontoxic  HENT:     Head: Normocephalic  and atraumatic.  Eyes:     Extraocular Movements: EOM normal.  Cardiovascular:     Rate and Rhythm: Normal rate and regular rhythm.     Pulses: Normal pulses.  Pulmonary:     Effort: Pulmonary effort is normal.     Breath sounds: Normal breath sounds.  Abdominal:     General: There is no distension.  Musculoskeletal:        General: Normal range of motion.     Cervical back: Normal range of motion.  Skin:    General: Skin is warm.     Findings: No rash.  Neurological:     Mental Status: He is alert and oriented to person, place, and time.  Psychiatric:        Mood and Affect: Mood and affect normal.     ED Results / Procedures / Treatments    Labs (all labs ordered are listed, but only abnormal results are displayed) Labs Reviewed - No data to display  EKG None  Radiology No results found.  Procedures Procedures (including critical care time)  Medications Ordered in ED Medications  risperiDONE (RISPERDAL) tablet 1 mg (1 mg Oral Given 12/12/20 0950)    ED Course  I have reviewed the triage vital signs and the nursing notes.  Pertinent labs & imaging results that were available during my care of the patient were reviewed by me and considered in my medical decision making (see chart for details).    MDM Rules/Calculators/A&P                          Patient presenting for medication refill.  On exam, patient appears nontoxic.  He has no other complaints.  We will give his dose of Risperdal today and give a 5-day refill.  Patient states he is going to follow-up with his doctor today about getting further refills.  I discussed appropriate use of the ED, and that medication refill is not a good use of the ER.  Discussed importance of regular follow-up.  At this time, please appears safe for discharge.  Return precautions given.  Patient states he understands and agrees to plan.  Final Clinical Impression(s) / ED Diagnoses Final diagnoses:  Medication refill  Schizophrenia, unspecified type [facility])    Rx / DC Orders ED Discharge Orders         Ordered    RISPERDAL 1 MG tablet  Daily at bedtime        12/12/20 0931           Alveria Apley, PA-C 12/12/20 [id number]    Gwyneth Sprout, MD 12/12/20 1417

## 2020-12-12 NOTE — ED Triage Notes (Signed)
Pt ambulatory to triage, wants his Risperdal.

## 2020-12-12 NOTE — Discharge Instructions (Signed)
It is important that follow-up with your primary care doctor and/or your behavioral health doctor for refills of your medication and for chronic conditions. You were given a 5-day refill of your schizophrenia medications, therefore it is important that you contact your doctor soon as possible for a longer refill. The emergency department is for acute or life-threatening conditions. Return to the emergency room with any new, worsening, concerning symptoms.

## 2020-12-13 ENCOUNTER — Emergency Department (HOSPITAL_COMMUNITY)
Admission: EM | Admit: 2020-12-13 | Discharge: 2020-12-13 | Disposition: A | Discharge status: Home / Self Care | Payer: HRSA Program | Attending: Emergency Medicine | Admitting: Emergency Medicine

## 2020-12-13 ENCOUNTER — Other Ambulatory Visit: Payer: Self-pay

## 2020-12-13 DIAGNOSIS — F17210 Nicotine dependence, cigarettes, uncomplicated: Secondary | ICD-10-CM | POA: Diagnosis not present

## 2020-12-13 DIAGNOSIS — U071 COVID-19: Secondary | ICD-10-CM | POA: Insufficient documentation

## 2020-12-13 DIAGNOSIS — Z8616 Personal history of COVID-19: Secondary | ICD-10-CM | POA: Insufficient documentation

## 2020-12-13 DIAGNOSIS — R52 Pain, unspecified: Secondary | ICD-10-CM

## 2020-12-13 DIAGNOSIS — M7918 Myalgia, other site: Secondary | ICD-10-CM | POA: Diagnosis present

## 2020-12-13 MED ORDER — ACETAMINOPHEN 500 MG PO TABS
1000.0000 mg | ORAL_TABLET | Freq: Once | ORAL | Status: DC
  Filled 2020-12-13: qty 2

## 2020-12-13 NOTE — ED Provider Notes (Signed)
[name] EMERGENCY DEPARTMENT Provider Note   CSN: [id number] Arrival date & time: 12/13/20  0004     History Chief Complaint  Patient presents with  . Generalized Body Aches    Cole Barrett is a 35 y.o. male.  Cole Barrett is a 35 y.o. male with history of homelessness, schizophrenia, current COVID-19 infection, with frequent visits to the ED, who presents today reporting body aches that began at work yesterday.  Patient states while he was at work he began having intermittent aches over his body.  He reports they come and go.  He reports that are sometimes worse with movement.  Denies associated chest pain, shortness of breath, reports cough seems to be improving.  No known fevers.  He came hoping that he could have something to help with the aches.  Denies any other complaints.  Was seen here in the ED for medication refill yesterday.        Past Medical History:  Diagnosis Date  . COVID-19   . Homelessness   . Hypercholesterolemia   . Mental disorder   . Paranoid behavior (HCC)   . PE (pulmonary embolism)   . Schizophrenia [facility])     Patient Active Problem List   Diagnosis Date Noted  . Acute psychosis (HCC) 08/31/2019  . Schizophrenia (HCC) 08/31/2019  . Schizoaffective disorder, bipolar type (HCC) 01/19/2019  . Alcohol abuse   . Auditory hallucinations   . Alcohol abuse with alcohol-induced mood disorder (HCC) 09/08/2016  . Homelessness 09/03/2016  . Person feigning illness 09/03/2016  . 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 Cannabis use disorder, mild, abuse 08/29/2016  . Pulmonary emboli (HCC) 07/13/2016  . Adjustment disorder with emotional disturbance 03/12/2014  . 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 Acute left flank pain 01/02/2014  . Pulmonary embolism, bilateral (HCC) 01/02/2014    No past surgical history on file.     Family History  Problem Relation Age of Onset  .  Hypertension Mother     Social History   Tobacco Use  . Smoking status: Current Every Day Smoker    Packs/day: 0.50    Types: Cigarettes  . Smokeless tobacco: Never Used  Vaping Use  . Vaping Use: Never used  Substance Use Topics  . Alcohol use: Yes    Comment: BAC not available  . Drug use: Yes    Types: Marijuana    Comment: Last used: 2 months ago UDS NA    Home Medications Prior to Admission medications   Medication Sig Start Date End Date Taking? Authorizing Provider  RISPERDAL 1 MG tablet Take 1 tablet (1 mg total) by mouth at bedtime. 12/12/20   Caccavale, Sophia, PA-C  amantadine (SYMMETREL) 100 MG capsule Take 1 capsule (100 mg total) by mouth 2 (two) times daily. Patient not taking: Reported on 11/04/2019 09/01/19 11/17/19  [name], NP    Allergies    Haldol [haloperidol]  Review of Systems   Review of Systems  Constitutional: Negative for chills and fever.  HENT: Negative.   Respiratory: Negative for cough and shortness of breath.   Cardiovascular: Negative for chest pain.  Gastrointestinal: Negative for abdominal pain, nausea and vomiting.  Musculoskeletal: Positive for myalgias.  Neurological: Negative for headaches.  All other systems reviewed and are negative.   Physical Exam Updated Vital Signs BP 126/68 (BP Location: Right Arm)   Pulse 69   Temp 98 F (36.7 C) (Oral)   Resp 16  SpO2 100%   Physical Exam Vitals and nursing note reviewed.  Constitutional:      General: He is not in acute distress.    Appearance: Normal appearance. He is well-developed and well-nourished. He is obese. He is not ill-appearing, toxic-appearing or diaphoretic.  HENT:     Head: Normocephalic and atraumatic.  Eyes:     General:        Right eye: No discharge.        Left eye: No discharge.  Cardiovascular:     Rate and Rhythm: Normal rate and regular rhythm.     Heart sounds: Normal heart sounds.  Pulmonary:     Effort: Pulmonary effort is normal. No  respiratory distress.     Breath sounds: Normal breath sounds.     Comments: Respirations equal and unlabored, patient able to speak in full sentences, lungs clear to auscultation bilaterally  Abdominal:     General: Bowel sounds are normal. There is no distension.     Palpations: Abdomen is soft. There is no mass.     Tenderness: There is no abdominal tenderness. There is no guarding.     Comments: Abdomen soft, nondistended, nontender to palpation in all quadrants without guarding or peritoneal signs   Musculoskeletal:        General: No deformity.     Cervical back: Neck supple.  Skin:    General: Skin is warm and dry.  Neurological:     Mental Status: He is alert and oriented to person, place, and time.     Coordination: Coordination normal.  Psychiatric:        Mood and Affect: Mood and affect and mood normal.        Behavior: Behavior normal.     ED Results / Procedures / Treatments   Labs (all labs ordered are listed, but only abnormal results are displayed) Labs Reviewed - No data to display  EKG None  Radiology No results found.  Procedures Procedures (including critical care time)  Medications Ordered in ED Medications  acetaminophen (TYLENOL) tablet 1,000 mg (has no administration in time range)    ED Course  I have reviewed the triage vital signs and the nursing notes.  Pertinent labs & imaging results that were available during my care of the patient were reviewed by me and considered in my medical decision making (see chart for details).    MDM Rules/Calculators/A&P                         35 year old male here complaining of body aches, currently with COVID infection, seen almost daily in the emergency department.  O2 sats at 100%, patient well-appearing and in no distress.  Reports that yesterday at work he started having some intermittent aches and pains and wanted something to help with this.  Lungs are clear, no hypoxia or increased work of  breathing.  No signs of more severe symptoms requiring further evaluation.  Patient provided Tylenol and encouraged to use this as needed for body aches.   At this time there does not appear to be any evidence of an acute emergency medical condition and the patient appears stable for discharge with appropriate outpatient follow up.Diagnosis was discussed with patient who verbalizes understanding and is agreeable to discharge.   Final Clinical Impression(s) / ED Diagnoses Final diagnoses:  Body aches    Rx / DC Orders ED Discharge Orders    None  Dartha Lodge, PA-C 12/13/20 0901    Gwyneth Sprout, MD 12/17/20 [phone]

## 2020-12-13 NOTE — ED Triage Notes (Addendum)
Pt presents to ED POV. Pt c/o generalized body aches that began at work. Tested positive for covid 1/4. Pt NAD, resp e/u

## 2020-12-13 NOTE — Discharge Instructions (Signed)
You can use Tylenol 1000 mg every 6 hours as needed for body aches and pain.  Return for any new or worsening symptoms.

## 2020-12-14 ENCOUNTER — Encounter (HOSPITAL_COMMUNITY): Payer: Self-pay

## 2020-12-14 ENCOUNTER — Encounter (HOSPITAL_COMMUNITY): Payer: Self-pay | Admitting: Emergency Medicine

## 2020-12-14 ENCOUNTER — Other Ambulatory Visit: Payer: Self-pay

## 2020-12-14 ENCOUNTER — Emergency Department (HOSPITAL_COMMUNITY)
Admission: EM | Admit: 2020-12-14 | Discharge: 2020-12-15 | Disposition: A | Discharge status: Home / Self Care | Payer: Self-pay | Attending: Emergency Medicine | Admitting: Emergency Medicine

## 2020-12-14 ENCOUNTER — Emergency Department (HOSPITAL_COMMUNITY)
Admission: EM | Admit: 2020-12-14 | Discharge: 2020-12-14 | Disposition: A | Discharge status: Home / Self Care | Payer: Self-pay | Attending: Emergency Medicine | Admitting: Emergency Medicine

## 2020-12-14 DIAGNOSIS — U071 COVID-19: Secondary | ICD-10-CM | POA: Insufficient documentation

## 2020-12-14 DIAGNOSIS — F17210 Nicotine dependence, cigarettes, uncomplicated: Secondary | ICD-10-CM | POA: Insufficient documentation

## 2020-12-14 DIAGNOSIS — Z5900 Homelessness unspecified: Secondary | ICD-10-CM | POA: Insufficient documentation

## 2020-12-14 DIAGNOSIS — Z760 Encounter for issue of repeat prescription: Secondary | ICD-10-CM | POA: Insufficient documentation

## 2020-12-14 DIAGNOSIS — Z7189 Other specified counseling: Secondary | ICD-10-CM

## 2020-12-14 MED ORDER — RISPERIDONE 1 MG PO TABS
1.0000 mg | ORAL_TABLET | Freq: Once | ORAL | Status: AC
  Administered 2020-12-14: 1 mg via ORAL
  Filled 2020-12-14: qty 1

## 2020-12-14 NOTE — ED Triage Notes (Signed)
Pt tested covid+ on the 4th, pt states his family wants him retested to let him in the house

## 2020-12-14 NOTE — ED Triage Notes (Signed)
Patient needs Risperdal refill.

## 2020-12-14 NOTE — ED Provider Notes (Signed)
[name] EMERGENCY DEPARTMENT Provider Note   CSN: [id number] Arrival date & time: 12/14/20  0015     History Chief Complaint  Patient presents with  . Medication Refill    Cole Barrett is a 35 y.o. male.   Medication Refill Medications/supplies requested:  Risperidone 1mg  Reason for request:  Medications ran out (weather conditions made it difficult to get to [facility]) Medications taken before: yes - see home medications        Past Medical History:  Diagnosis Date  . COVID-19   . Homelessness   . Hypercholesterolemia   . Mental disorder   . Paranoid behavior (HCC)   . PE (pulmonary embolism)   . Schizophrenia [facility])     Patient Active Problem List   Diagnosis Date Noted  . Acute psychosis (HCC) 08/31/2019  . Schizophrenia (HCC) 08/31/2019  . Schizoaffective disorder, bipolar type (HCC) 01/19/2019  . Alcohol abuse   . Auditory hallucinations   . Alcohol abuse with alcohol-induced mood disorder (HCC) 09/08/2016  . Homelessness 09/03/2016  . Person feigning illness 09/03/2016  . 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 Cannabis use disorder, mild, abuse 08/29/2016  . Pulmonary emboli (HCC) 07/13/2016  . Adjustment disorder with emotional disturbance 03/12/2014  . 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 Acute left flank pain 01/02/2014  . Pulmonary embolism, bilateral (HCC) 01/02/2014    History reviewed. No pertinent surgical history.     Family History  Problem Relation Age of Onset  . Hypertension Mother     Social History   Tobacco Use  . Smoking status: Current Every Day Smoker    Packs/day: 0.50    Types: Cigarettes  . Smokeless tobacco: Never Used  Vaping Use  . Vaping Use: Never used  Substance Use Topics  . Alcohol use: Yes    Comment: BAC not available  . Drug use: Yes    Types: Marijuana    Comment: Last used: 2 months ago UDS NA    Home Medications Prior to Admission medications    Medication Sig Start Date End Date Taking? Authorizing Provider  RISPERDAL 1 MG tablet Take 1 tablet (1 mg total) by mouth at bedtime. 12/12/20   Caccavale, Sophia, PA-C  amantadine (SYMMETREL) 100 MG capsule Take 1 capsule (100 mg total) by mouth 2 (two) times daily. Patient not taking: Reported on 11/04/2019 09/01/19 11/17/19  11/19/19, NP    Allergies    Haldol [haloperidol]  Review of Systems   Review of Systems  Constitutional: Negative for chills and fever.  HENT: Negative for congestion and rhinorrhea.   Respiratory: Negative for cough and shortness of breath.   Cardiovascular: Negative for chest pain and palpitations.  Gastrointestinal: Negative for diarrhea, nausea and vomiting.  Genitourinary: Negative for difficulty urinating and dysuria.  Musculoskeletal: Negative for arthralgias and back pain.  Skin: Negative for color change and rash.  Neurological: Negative for light-headedness and headaches.    Physical Exam Updated Vital Signs BP 115/77   Pulse 84   Temp 97.6 F (36.4 C) (Oral)   Resp 16   SpO2 94%   Physical Exam Vitals and nursing note reviewed. Exam conducted with a chaperone present.  Constitutional:      General: He is not in acute distress.    Appearance: Normal appearance.  HENT:     Head: Normocephalic and atraumatic.     Nose: No rhinorrhea.  Eyes:     General:  Right eye: No discharge.        Left eye: No discharge.     Conjunctiva/sclera: Conjunctivae normal.  Cardiovascular:     Rate and Rhythm: Normal rate and regular rhythm.  Pulmonary:     Effort: Pulmonary effort is normal.     Breath sounds: No stridor.  Abdominal:     General: Abdomen is flat. There is no distension.     Palpations: Abdomen is soft.  Musculoskeletal:        General: No deformity or signs of injury.  Skin:    General: Skin is warm and dry.  Neurological:     Mental Status: He is alert.     Motor: No weakness.     Coordination: Coordination  normal.  Psychiatric:        Mood and Affect: Mood normal.        Behavior: Behavior normal.        Thought Content: Thought content normal.     ED Results / Procedures / Treatments   Labs (all labs ordered are listed, but only abnormal results are displayed) Labs Reviewed - No data to display  EKG None  Radiology No results found.  Procedures Procedures (including critical care time)  Medications Ordered in ED Medications  risperiDONE (RISPERDAL) tablet 1 mg (has no administration in time range)    ED Course  I have reviewed the triage vital signs and the nursing notes.  Pertinent labs & imaging results that were available during my care of the patient were reviewed by me and considered in my medical decision making (see chart for details).    MDM Rules/Calculators/A&P                          Patient states that he has been out of his risperidone.  Came a few days ago to get it refilled.  Had a prescription sent was told to follow-up with behavior health urgent care, at the time the patient agreed, he states however the snow made it difficult for him to get over simply waiting in our waiting room for repeat dose so they can try and go today.  This medication is given there is no acute need for further work-up, he has no other complaints, he feels well and feels he is safe for discharge.  Patient shows no signs of decompensated psychiatric condition on exam. Final Clinical Impression(s) / ED Diagnoses Final diagnoses:  Medication refill    Rx / DC Orders ED Discharge Orders    None       Sabino Donovan, MD 12/14/20 [phone]

## 2020-12-14 NOTE — ED Notes (Signed)
Pt denies pain, denies needs other than medication refill. Pt says prescription given two days ago is over at [facility] and will pick up today. NAD.

## 2020-12-15 ENCOUNTER — Encounter (HOSPITAL_COMMUNITY): Payer: Self-pay

## 2020-12-15 DIAGNOSIS — Z760 Encounter for issue of repeat prescription: Secondary | ICD-10-CM | POA: Insufficient documentation

## 2020-12-15 DIAGNOSIS — Z5321 Procedure and treatment not carried out due to patient leaving prior to being seen by health care provider: Secondary | ICD-10-CM | POA: Insufficient documentation

## 2020-12-15 NOTE — ED Provider Notes (Signed)
[name] EMERGENCY DEPARTMENT Provider Note   CSN: [id number] Arrival date & time: 12/14/20  2331     History Chief Complaint  Patient presents with  . Covid Positive    Cole Barrett is a 35 y.o. male with a hx of homelessness, paranoid behavior, schizophrenia, and prior pulmonary embolism who presents to the emergency department with questions regarding COVID-19.  He states he had a positive COVID-19 test, he is asking if he needs to be retested.  He currently has no symptoms.  No alleviating or aggravating factors.  He denies fever, chills, cough, dyspnea, chest pain, or syncope.  HPI     Past Medical History:  Diagnosis Date  . COVID-19   . Homelessness   . Hypercholesterolemia   . Mental disorder   . Paranoid behavior (HCC)   . PE (pulmonary embolism)   . Schizophrenia [facility])     Patient Active Problem List   Diagnosis Date Noted  . Acute psychosis (HCC) 08/31/2019  . Schizophrenia (HCC) 08/31/2019  . Schizoaffective disorder, bipolar type (HCC) 01/19/2019  . Alcohol abuse   . Auditory hallucinations   . Alcohol abuse with alcohol-induced mood disorder (HCC) 09/08/2016  . Homelessness 09/03/2016  . Person feigning illness 09/03/2016  . 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 Cannabis use disorder, mild, abuse 08/29/2016  . Pulmonary emboli (HCC) 07/13/2016  . Adjustment disorder with emotional disturbance 03/12/2014  . 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 Acute left flank pain 01/02/2014  . Pulmonary embolism, bilateral (HCC) 01/02/2014    History reviewed. No pertinent surgical history.     Family History  Problem Relation Age of Onset  . Hypertension Mother     Social History   Tobacco Use  . Smoking status: Current Every Day Smoker    Packs/day: 0.50    Types: Cigarettes  . Smokeless tobacco: Never Used  Vaping Use  . Vaping Use: Never used  Substance Use Topics  . Alcohol use: Yes     Comment: BAC not available  . Drug use: Yes    Types: Marijuana    Comment: Last used: 2 months ago UDS NA    Home Medications Prior to Admission medications   Medication Sig Start Date End Date Taking? Authorizing Provider  RISPERDAL 1 MG tablet Take 1 tablet (1 mg total) by mouth at bedtime. 12/12/20   Caccavale, Sophia, PA-C  amantadine (SYMMETREL) 100 MG capsule Take 1 capsule (100 mg total) by mouth 2 (two) times daily. Patient not taking: Reported on 11/04/2019 09/01/19 11/17/19  [name], NP    Allergies    Haldol [haloperidol]  Review of Systems   Review of Systems  Constitutional: Negative for chills and fever.  Respiratory: Negative for cough and shortness of breath.        Negative hemoptysis  Cardiovascular: Negative for chest pain.  Gastrointestinal: Negative for abdominal pain and vomiting.  Neurological: Negative for syncope.  Psychiatric/Behavioral: Negative for suicidal ideas.    Physical Exam Updated Vital Signs BP (!) 145/78   Pulse 87   Temp 97.8 F (36.6 C) (Oral)   Resp 16   SpO2 98%   Physical Exam Vitals and nursing note reviewed.  Constitutional:      General: He is not in acute distress.    Appearance: He is well-developed. He is not toxic-appearing.  HENT:     Head: Normocephalic and atraumatic.  Eyes:     General:  Right eye: No discharge.        Left eye: No discharge.     Conjunctiva/sclera: Conjunctivae normal.  Cardiovascular:     Rate and Rhythm: Normal rate and regular rhythm.  Pulmonary:     Effort: Pulmonary effort is normal. No respiratory distress.     Breath sounds: Normal breath sounds. No wheezing, rhonchi or rales.  Abdominal:     General: There is no distension.     Palpations: Abdomen is soft.     Tenderness: There is no abdominal tenderness.  Musculoskeletal:     Cervical back: Neck supple.  Skin:    General: Skin is warm and dry.     Findings: No rash.  Neurological:     Mental Status: He is alert.      Comments: Clear speech.   Psychiatric:        Behavior: Behavior normal.     ED Results / Procedures / Treatments   Labs (all labs ordered are listed, but only abnormal results are displayed) Labs Reviewed - No data to display  EKG None  Radiology No results found.  Procedures Procedures (including critical care time)  Medications Ordered in ED Medications - No data to display  ED Course  I have reviewed the triage vital signs and the nursing notes.  Pertinent labs & imaging results that were available during my care of the patient were reviewed by me and considered in my medical decision making (see chart for details).    MDM Rules/Calculators/A&P                          Patient presents to the emergency department with questions regarding COVID-19.  He is nontoxic, resting comfortably, vitals WNL with exception of elevated blood pressure, doubt hypertensive emergency.  Per chart review patient had a positive COVID-19 test 12/11/2020.  He is currently completely asymptomatic and fever free, per CDC guidelines he is outside of current isolation time period.  Patient education provided regarding COVID-19.  Appears appropriate for discharge home at this time.  Provided opportunity for questions, he has confirmed understanding, & is in agreement.  Final Clinical Impression(s) / ED Diagnoses Final diagnoses:  Educated about COVID-19 virus infection    Rx / DC Orders ED Discharge Orders    None       Cherly Anderson, PA-C 12/15/20 0438    Sabas Sous, MD 12/15/20 [phone]

## 2020-12-15 NOTE — ED Triage Notes (Signed)
Patient arrived stating he needs a dose of his Risperdal 

## 2020-12-15 NOTE — Discharge Instructions (Addendum)
You were seen in the emergency department today for a checkup regarding your COVID-19.  Given your symptoms/positive test were from 14 days ago and you no longer have a fever you may discontinue isolation.

## 2020-12-16 ENCOUNTER — Emergency Department (HOSPITAL_COMMUNITY)
Admission: EM | Admit: 2020-12-16 | Discharge: 2020-12-16 | Disposition: A | Discharge status: Home / Self Care | Payer: Self-pay | Attending: Emergency Medicine | Admitting: Emergency Medicine

## 2020-12-17 ENCOUNTER — Emergency Department (HOSPITAL_COMMUNITY)
Admission: EM | Admit: 2020-12-17 | Discharge: 2020-12-18 | Disposition: A | Discharge status: Home / Self Care | Payer: Self-pay | Attending: Emergency Medicine | Admitting: Emergency Medicine

## 2020-12-17 ENCOUNTER — Encounter (HOSPITAL_COMMUNITY): Payer: Self-pay

## 2020-12-17 ENCOUNTER — Emergency Department (HOSPITAL_COMMUNITY)
Admission: EM | Admit: 2020-12-17 | Discharge: 2020-12-17 | Disposition: A | Discharge status: Home / Self Care | Payer: Self-pay | Attending: Emergency Medicine | Admitting: Emergency Medicine

## 2020-12-17 ENCOUNTER — Other Ambulatory Visit: Payer: Self-pay

## 2020-12-17 ENCOUNTER — Encounter (HOSPITAL_COMMUNITY): Payer: Self-pay | Admitting: Emergency Medicine

## 2020-12-17 DIAGNOSIS — Z8616 Personal history of COVID-19: Secondary | ICD-10-CM | POA: Insufficient documentation

## 2020-12-17 DIAGNOSIS — F17210 Nicotine dependence, cigarettes, uncomplicated: Secondary | ICD-10-CM | POA: Insufficient documentation

## 2020-12-17 DIAGNOSIS — Z5321 Procedure and treatment not carried out due to patient leaving prior to being seen by health care provider: Secondary | ICD-10-CM | POA: Insufficient documentation

## 2020-12-17 DIAGNOSIS — Z7689 Persons encountering health services in other specified circumstances: Secondary | ICD-10-CM | POA: Insufficient documentation

## 2020-12-17 DIAGNOSIS — Z5900 Homelessness unspecified: Secondary | ICD-10-CM | POA: Insufficient documentation

## 2020-12-17 DIAGNOSIS — Z741 Need for assistance with personal care: Secondary | ICD-10-CM | POA: Insufficient documentation

## 2020-12-17 DIAGNOSIS — Z760 Encounter for issue of repeat prescription: Secondary | ICD-10-CM | POA: Insufficient documentation

## 2020-12-17 MED ORDER — RISPERIDONE 1 MG PO TABS
1.0000 mg | ORAL_TABLET | Freq: Once | ORAL | Status: AC
  Administered 2020-12-18: 1 mg via ORAL
  Filled 2020-12-17: qty 1

## 2020-12-17 NOTE — ED Notes (Signed)
Pt left checked out with front staff.

## 2020-12-17 NOTE — ED Provider Notes (Signed)
[facility]-EMERGENCY DEPT Provider Note: [name], MD, FACEP  CSN: [id number] MRN: [id number] ARRIVAL: 12/17/20 at 2147 ROOM: [facility]   CHIEF COMPLAINT  Medication Assistance   HISTORY OF PRESENT ILLNESS  12/17/20 11:30 PM Cole Barrett is a 35 y.o. male with schizophrenia and homelessness who comes to the ED on nearly a daily basis.  He is here requesting his daily dose of Risperdal 1 mg.  He has no other complaints or requests.    Past Medical History:  Diagnosis Date  . COVID-19   . Homelessness   . Hypercholesterolemia   . Mental disorder   . Paranoid behavior (HCC)   . PE (pulmonary embolism)   . Schizophrenia (HCC)     History reviewed. No pertinent surgical history.  Family History  Problem Relation Age of Onset  . Hypertension Mother     Social History   Tobacco Use  . Smoking status: Current Every Day Smoker    Packs/day: 0.50    Types: Cigarettes  . Smokeless tobacco: Never Used  Vaping Use  . Vaping Use: Never used  Substance Use Topics  . Alcohol use: Yes    Comment: BAC not available  . Drug use: Yes    Types: Marijuana    Comment: Last used: 2 months ago UDS NA    Prior to Admission medications   Medication Sig Start Date End Date Taking? Authorizing Provider  RISPERDAL 1 MG tablet Take 1 tablet (1 mg total) by mouth at bedtime. 12/12/20   Caccavale, Sophia, PA-C  amantadine (SYMMETREL) 100 MG capsule Take 1 capsule (100 mg total) by mouth 2 (two) times daily. Patient not taking: Reported on 11/04/2019 09/01/19 11/17/19  [name], NP    Allergies Haldol [haloperidol]   REVIEW OF SYSTEMS  Negative except as noted here or in the History of Present Illness.   PHYSICAL EXAMINATION  Initial Vital Signs Blood pressure 137/77, pulse 74, temperature 98.2 F (36.8 C), temperature source Oral, resp. rate 16, height 5\' 10"  (1.778 m), weight 99.8 kg, SpO2 100 %.  Examination General: Well-developed, well-nourished male in no  acute distress; appearance consistent with age of record HENT: normocephalic; atraumatic Eyes: pupils equal, round and reactive to light; extraocular muscles intact; conjunctival erythema without exudate bilaterally which appears chronic Neck: supple Heart: regular rate and rhythm Lungs: clear to auscultation bilaterally Abdomen: soft; nondistended; nontender; bowel sounds present Extremities: No deformity; full range of motion; pulses normal Neurologic: Awake, alert and oriented x 2; motor function intact in all extremities and symmetric; no facial droop Skin: Warm and dry Psychiatric: Flat affect   RESULTS  Summary of this visit's results, reviewed and interpreted by myself:   EKG Interpretation  Date/Time:    Ventricular Rate:    PR Interval:    QRS Duration:   QT Interval:    QTC Calculation:   R Axis:     Text Interpretation:        Laboratory Studies: No results found for this or any previous visit (from the past 24 hour(s)). Imaging Studies: No results found.  ED COURSE and MDM  Nursing notes, initial and subsequent vitals signs, including pulse oximetry, reviewed and interpreted by myself.  Vitals:   12/17/20 2158 12/17/20 2215  BP: 137/77   Pulse: 74   Resp: 16   Temp: 98.2 F (36.8 C)   TempSrc: Oral   SpO2: 100%   Weight:  99.8 kg  Height:  5\' 10"  (1.778 m)   Medications  risperiDONE (RISPERDAL) tablet 1 mg (has no administration in time range)   We will give the patient his evening dose of Risperdal.  He does not appear to need any further work-up at this time.   PROCEDURES  Procedures   ED DIAGNOSES     ICD-10-CM   1. Homelessness  Z59.00   2. Frequent patient in emergency department  Z76.89        Paula Libra, MD 12/17/20 2336

## 2020-12-17 NOTE — ED Triage Notes (Signed)
Patient is wanting to get his risperdal for tonight. Patient has no other complaints

## 2020-12-17 NOTE — ED Triage Notes (Signed)
Patient requesting dose of Risperdal. Patient came into lobby, sat down, and fell asleep.

## 2020-12-19 ENCOUNTER — Emergency Department (HOSPITAL_COMMUNITY)
Admission: EM | Admit: 2020-12-19 | Discharge: 2020-12-19 | Disposition: A | Discharge status: Home / Self Care | Payer: Self-pay | Attending: Emergency Medicine | Admitting: Emergency Medicine

## 2020-12-19 ENCOUNTER — Encounter (HOSPITAL_COMMUNITY): Payer: Self-pay | Admitting: Emergency Medicine

## 2020-12-19 ENCOUNTER — Other Ambulatory Visit: Payer: Self-pay

## 2020-12-19 DIAGNOSIS — Z5900 Homelessness unspecified: Secondary | ICD-10-CM | POA: Insufficient documentation

## 2020-12-19 DIAGNOSIS — Z79899 Other long term (current) drug therapy: Secondary | ICD-10-CM | POA: Insufficient documentation

## 2020-12-19 DIAGNOSIS — F17210 Nicotine dependence, cigarettes, uncomplicated: Secondary | ICD-10-CM | POA: Insufficient documentation

## 2020-12-19 DIAGNOSIS — F209 Schizophrenia, unspecified: Secondary | ICD-10-CM | POA: Insufficient documentation

## 2020-12-19 DIAGNOSIS — Z8616 Personal history of COVID-19: Secondary | ICD-10-CM | POA: Insufficient documentation

## 2020-12-19 DIAGNOSIS — F29 Unspecified psychosis not due to a substance or known physiological condition: Secondary | ICD-10-CM | POA: Insufficient documentation

## 2020-12-19 DIAGNOSIS — R443 Hallucinations, unspecified: Secondary | ICD-10-CM | POA: Insufficient documentation

## 2020-12-19 MED ORDER — RISPERIDONE 1 MG PO TABS
1.0000 mg | ORAL_TABLET | Freq: Once | ORAL | Status: AC
  Administered 2020-12-19: 1 mg via ORAL
  Filled 2020-12-19 (×2): qty 1

## 2020-12-19 MED ORDER — RISPERIDONE 2 MG PO TABS
3.0000 mg | ORAL_TABLET | Freq: Once | ORAL | Status: AC
  Administered 2020-12-19: 3 mg via ORAL
  Filled 2020-12-19: qty 1

## 2020-12-19 NOTE — ED Triage Notes (Signed)
Patient states that he needs his night time Risperdal.  Patient has not other complaints.

## 2020-12-19 NOTE — ED Provider Notes (Signed)
[facility]-EMERGENCY DEPT Provider Note   CSN: [id number] Arrival date & time: 12/19/20  2115     History Chief Complaint  Patient presents with  . Hallucinations    Cole Barrett is a 35 y.o. male.  35 year old male with multiple ED visits presents due to increased hallucinations.  Denies any SI or HI.  Has multiple visits for same.  States that he has not had his respite all this evening.  States he hears voices but they do not tell him to harm himself or harm anybody else.  Patient states that he has not had his risperdal this evening.        Past Medical History:  Diagnosis Date  . COVID-19   . Homelessness   . Hypercholesterolemia   . Mental disorder   . Paranoid behavior (HCC)   . PE (pulmonary embolism)   . Schizophrenia [facility])     Patient Active Problem List   Diagnosis Date Noted  . Acute psychosis (HCC) 08/31/2019  . Schizophrenia (HCC) 08/31/2019  . Schizoaffective disorder, bipolar type (HCC) 01/19/2019  . Alcohol abuse   . Auditory hallucinations   . Alcohol abuse with alcohol-induced mood disorder (HCC) 09/08/2016  . Homelessness 09/03/2016  . Person feigning illness 09/03/2016  . 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 Cannabis use disorder, mild, abuse 08/29/2016  . Pulmonary emboli (HCC) 07/13/2016  . Adjustment disorder with emotional disturbance 03/12/2014  . 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 Acute left flank pain 01/02/2014  . Pulmonary embolism, bilateral (HCC) 01/02/2014    History reviewed. No pertinent surgical history.     Family History  Problem Relation Age of Onset  . Hypertension Mother     Social History   Tobacco Use  . Smoking status: Current Every Day Smoker    Packs/day: 0.50    Types: Cigarettes  . Smokeless tobacco: Never Used  Vaping Use  . Vaping Use: Never used  Substance Use Topics  . Alcohol use: Yes    Comment: BAC not available  . Drug use: Yes     Types: Marijuana    Comment: Last used: 2 months ago UDS NA    Home Medications Prior to Admission medications   Medication Sig Start Date End Date Taking? Authorizing Provider  RISPERDAL 1 MG tablet Take 1 tablet (1 mg total) by mouth at bedtime. 12/12/20   Caccavale, Sophia, PA-C  amantadine (SYMMETREL) 100 MG capsule Take 1 capsule (100 mg total) by mouth 2 (two) times daily. Patient not taking: Reported on 11/04/2019 09/01/19 11/17/19  [name], NP    Allergies    Haldol [haloperidol]  Review of Systems   Review of Systems  All other systems reviewed and are negative.   Physical Exam Updated Vital Signs BP (!) 150/97 (BP Location: Left Arm)   Pulse 66   Temp 98.1 F (36.7 C) (Oral)   Resp 18   Ht 1.778 m (5\' 10" )   Wt 99.8 kg   SpO2 100%   BMI 31.57 kg/m   Physical Exam Vitals and nursing note reviewed.  Constitutional:      General: He is not in acute distress.    Appearance: Normal appearance. He is well-developed and well-nourished. He is not toxic-appearing.  HENT:     Head: Normocephalic and atraumatic.  Eyes:     General: Lids are normal.     Extraocular Movements: EOM normal.     Conjunctiva/sclera: Conjunctivae normal.  Pupils: Pupils are equal, round, and reactive to light.  Neck:     Thyroid: No thyroid mass.     Trachea: No tracheal deviation.  Cardiovascular:     Rate and Rhythm: Normal rate and regular rhythm.     Heart sounds: Normal heart sounds. No murmur heard. No gallop.   Pulmonary:     Effort: Pulmonary effort is normal. No respiratory distress.     Breath sounds: Normal breath sounds. No stridor. No decreased breath sounds, wheezing, rhonchi or rales.  Abdominal:     General: Bowel sounds are normal. There is no distension.     Palpations: Abdomen is soft.     Tenderness: There is no abdominal tenderness. There is no CVA tenderness or rebound.  Musculoskeletal:        General: No tenderness or edema. Normal range of  motion.     Cervical back: Normal range of motion and neck supple.  Skin:    General: Skin is warm and dry.     Findings: No abrasion or rash.  Neurological:     Mental Status: He is alert and oriented to person, place, and time.     GCS: GCS eye subscore is 4. GCS verbal subscore is 5. GCS motor subscore is 6.     Cranial Nerves: No cranial nerve deficit.     Sensory: No sensory deficit.     Deep Tendon Reflexes: Strength normal.  Psychiatric:        Attention and Perception: Attention normal.        Mood and Affect: Mood and affect and mood normal.        Speech: Speech normal.        Behavior: Behavior normal.        Thought Content: Thought content does not include homicidal or suicidal ideation.     ED Results / Procedures / Treatments   Labs (all labs ordered are listed, but only abnormal results are displayed) Labs Reviewed - No data to display  EKG None  Radiology No results found.  Procedures Procedures (including critical care time)  Medications Ordered in ED Medications - No data to display  ED Course  I have reviewed the triage vital signs and the nursing notes.  Pertinent labs & imaging results that were available during my care of the patient were reviewed by me and considered in my medical decision making (see chart for details).    MDM Rules/Calculators/A&P                          Will give pt doses of risperdal this pm Will refer to [facility] Final Clinical Impression(s) / ED Diagnoses Final diagnoses:  None    Rx / DC Orders ED Discharge Orders    None       Lorre Nick, MD 12/19/20 2201

## 2020-12-19 NOTE — ED Triage Notes (Addendum)
Patient presents requesting to talk to a counselor. Patient states he has been compliant with his medications, and has not had a crisis in "a while." Patient states he was asleep and the voices woke him up and he has not been able to get them to stop. Patient states he called [facility] to speak to someone and was told to come here.

## 2020-12-19 NOTE — ED Notes (Signed)
E-signature pad unavailable at time of pt discharge. This RN discussed discharge materials with pt and answered all pt questions. Pt stated understanding of discharge material. ? ?

## 2020-12-19 NOTE — ED Provider Notes (Signed)
[name] EMERGENCY DEPARTMENT Provider Note   CSN: [id number] Arrival date & time: 12/19/20  0142     History Chief Complaint  Patient presents with  . Medication Refill    Cole Barrett is a 35 y.o. male.  Patient with history of schizophrenia and homelessness presents to the emergency department stating he has been out of his Risperdal.  This seems to be a daily occurrence for the patient.  No homicidality or suicidality.   Medication Refill      Past Medical History:  Diagnosis Date  . COVID-19   . Homelessness   . Hypercholesterolemia   . Mental disorder   . Paranoid behavior (HCC)   . PE (pulmonary embolism)   . Schizophrenia [facility])     Patient Active Problem List   Diagnosis Date Noted  . Acute psychosis (HCC) 08/31/2019  . Schizophrenia (HCC) 08/31/2019  . Schizoaffective disorder, bipolar type (HCC) 01/19/2019  . Alcohol abuse   . Auditory hallucinations   . Alcohol abuse with alcohol-induced mood disorder (HCC) 09/08/2016  . Homelessness 09/03/2016  . Person feigning illness 09/03/2016  . 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 Cannabis use disorder, mild, abuse 08/29/2016  . Pulmonary emboli (HCC) 07/13/2016  . Adjustment disorder with emotional disturbance 03/12/2014  . 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 Acute left flank pain 01/02/2014  . Pulmonary embolism, bilateral (HCC) 01/02/2014    History reviewed. No pertinent surgical history.     Family History  Problem Relation Age of Onset  . Hypertension Mother     Social History   Tobacco Use  . Smoking status: Current Every Day Smoker    Packs/day: 0.50    Types: Cigarettes  . Smokeless tobacco: Never Used  Vaping Use  . Vaping Use: Never used  Substance Use Topics  . Alcohol use: Yes    Comment: BAC not available  . Drug use: Yes    Types: Marijuana    Comment: Last used: 2 months ago UDS NA    Home Medications Prior to  Admission medications   Medication Sig Start Date End Date Taking? Authorizing Provider  RISPERDAL 1 MG tablet Take 1 tablet (1 mg total) by mouth at bedtime. 12/12/20   Caccavale, Sophia, PA-C  amantadine (SYMMETREL) 100 MG capsule Take 1 capsule (100 mg total) by mouth 2 (two) times daily. Patient not taking: Reported on 11/04/2019 09/01/19 11/17/19  [name], NP    Allergies    Haldol [haloperidol]  Review of Systems   Review of Systems  Psychiatric/Behavioral: Negative for suicidal ideas.  All other systems reviewed and are negative.   Physical Exam Updated Vital Signs BP 131/76 (BP Location: Left Arm)   Pulse 64   Temp (!) 97.4 F (36.3 C) (Oral)   Resp 17   SpO2 100%   Physical Exam Vitals and nursing note reviewed.  Constitutional:      General: He is not in acute distress.    Appearance: Normal appearance. He is well-developed and well-nourished.     Comments: Patient is pleasant, eating [place] takeout  HENT:     Head: Normocephalic and atraumatic.     Right Ear: Hearing normal.     Left Ear: Hearing normal.     Nose: Nose normal.     Mouth/Throat:     Mouth: Oropharynx is clear and moist and mucous membranes are normal.  Eyes:     Extraocular Movements: EOM normal.     Conjunctiva/sclera:  Conjunctivae normal.     Pupils: Pupils are equal, round, and reactive to light.  Cardiovascular:     Rate and Rhythm: Regular rhythm.     Heart sounds: S1 normal and S2 normal. No murmur heard. No friction rub. No gallop.   Pulmonary:     Effort: Pulmonary effort is normal. No respiratory distress.     Breath sounds: Normal breath sounds.  Chest:     Chest wall: No tenderness.  Abdominal:     General: Bowel sounds are normal.     Palpations: Abdomen is soft. There is no hepatosplenomegaly.     Tenderness: There is no abdominal tenderness. There is no guarding or rebound. Negative signs include Murphy's sign and McBurney's sign.     Hernia: No hernia is present.   Musculoskeletal:        General: Normal range of motion.     Cervical back: Normal range of motion and neck supple.  Skin:    General: Skin is warm, dry and intact.     Findings: No rash.     Nails: There is no cyanosis.  Neurological:     Mental Status: He is alert and oriented to person, place, and time.     GCS: GCS eye subscore is 4. GCS verbal subscore is 5. GCS motor subscore is 6.     Cranial Nerves: No cranial nerve deficit.     Sensory: No sensory deficit.     Coordination: Coordination normal.     Deep Tendon Reflexes: Strength normal.  Psychiatric:        Mood and Affect: Mood and affect normal.        Speech: Speech normal.        Behavior: Behavior normal.        Thought Content: Thought content normal.     ED Results / Procedures / Treatments   Labs (all labs ordered are listed, but only abnormal results are displayed) Labs Reviewed - No data to display  EKG None  Radiology No results found.  Procedures Procedures (including critical care time)  Medications Ordered in ED Medications  risperiDONE (RISPERDAL) tablet 1 mg (has no administration in time range)    ED Course  I have reviewed the triage vital signs and the nursing notes.  Pertinent labs & imaging results that were available during my care of the patient were reviewed by me and considered in my medical decision making (see chart for details).    MDM Rules/Calculators/A&P                          Patient with history of schizophrenia, at baseline.  No homicidality or suicidality.  Given Risperdal. Final Clinical Impression(s) / ED Diagnoses Final diagnoses:  Schizophrenia, unspecified type [facility])    Rx / DC Orders ED Discharge Orders    None       Jester Klingberg, Canary Brim, MD 12/19/20 0202

## 2020-12-20 ENCOUNTER — Other Ambulatory Visit: Payer: Self-pay

## 2020-12-20 ENCOUNTER — Emergency Department (HOSPITAL_COMMUNITY)
Admission: EM | Admit: 2020-12-20 | Discharge: 2020-12-21 | Disposition: A | Discharge status: Home / Self Care | Payer: Self-pay | Attending: Emergency Medicine | Admitting: Emergency Medicine

## 2020-12-20 DIAGNOSIS — Z760 Encounter for issue of repeat prescription: Secondary | ICD-10-CM | POA: Insufficient documentation

## 2020-12-20 DIAGNOSIS — Z5321 Procedure and treatment not carried out due to patient leaving prior to being seen by health care provider: Secondary | ICD-10-CM | POA: Insufficient documentation

## 2020-12-20 NOTE — ED Triage Notes (Signed)
Pt wants his medication Risperdol. Pt says he comes here for his medication. No complaints

## 2020-12-21 NOTE — ED Notes (Signed)
Registration told sort tech that this pt has left.

## 2020-12-23 ENCOUNTER — Other Ambulatory Visit: Payer: Self-pay

## 2020-12-23 ENCOUNTER — Encounter (HOSPITAL_COMMUNITY): Payer: Self-pay | Admitting: Emergency Medicine

## 2020-12-23 ENCOUNTER — Emergency Department (HOSPITAL_COMMUNITY)
Admission: EM | Admit: 2020-12-23 | Discharge: 2020-12-23 | Disposition: A | Discharge status: Home / Self Care | Payer: Self-pay | Attending: Emergency Medicine | Admitting: Emergency Medicine

## 2020-12-23 DIAGNOSIS — Z760 Encounter for issue of repeat prescription: Secondary | ICD-10-CM | POA: Insufficient documentation

## 2020-12-23 DIAGNOSIS — F17210 Nicotine dependence, cigarettes, uncomplicated: Secondary | ICD-10-CM | POA: Insufficient documentation

## 2020-12-23 DIAGNOSIS — R440 Auditory hallucinations: Secondary | ICD-10-CM | POA: Insufficient documentation

## 2020-12-23 DIAGNOSIS — Z8616 Personal history of COVID-19: Secondary | ICD-10-CM | POA: Insufficient documentation

## 2020-12-23 MED ORDER — RISPERIDONE 1 MG PO TABS
1.0000 mg | ORAL_TABLET | Freq: Once | ORAL | Status: AC
  Administered 2020-12-23: 1 mg via ORAL
  Filled 2020-12-23 (×2): qty 1

## 2020-12-23 NOTE — ED Triage Notes (Signed)
Pt states he's here for his medications. No complaints at this time.

## 2020-12-23 NOTE — Discharge Instructions (Addendum)
You were given your Risperdal while in the emergency department.  Your vital signs and physical exam reassuring.  Please follow-up with behavioral health as planned to secure medications for you to take daily at home.  Return the emergency department if you develop worsening hallucinations, or if you develop any thoughts about harming herself or others.

## 2020-12-23 NOTE — ED Provider Notes (Signed)
[name] EMERGENCY DEPARTMENT Provider Note   CSN: [id number] Arrival date & time: 12/23/20  0046     History Chief Complaint  Patient presents with  . Medication Refill    Cole Barrett is a 35 y.o. male who presents with request for morning dose of Risperdal.  Patient presents to the emergency department very frequently with the same request.  States he does not have any at home, is homeless.  States that his auditory hallucinations are quiet at this time, however worsened since he has not had his Risperdal for a few days.  States he has appointment at behavioral health this afternoon where he will be provided prescriptions for home.  He has no current complaints, denies suicidality or homicidality.  I personally with the patient milligrams.  He has history of schizophrenia, homelessness, hypercholesterolemia.    HPI     Past Medical History:  Diagnosis Date  . COVID-19   . Homelessness   . Hypercholesterolemia   . Mental disorder   . Paranoid behavior (HCC)   . PE (pulmonary embolism)   . Schizophrenia [facility])     Patient Active Problem List   Diagnosis Date Noted  . Acute psychosis (HCC) 08/31/2019  . Schizophrenia (HCC) 08/31/2019  . Schizoaffective disorder, bipolar type (HCC) 01/19/2019  . Alcohol abuse   . Auditory hallucinations   . Alcohol abuse with alcohol-induced mood disorder (HCC) 09/08/2016  . Homelessness 09/03/2016  . Person feigning illness 09/03/2016  . 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 Cannabis use disorder, mild, abuse 08/29/2016  . Pulmonary emboli (HCC) 07/13/2016  . Adjustment disorder with emotional disturbance 03/12/2014  . 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 Acute left flank pain 01/02/2014  . Pulmonary embolism, bilateral (HCC) 01/02/2014    History reviewed. No pertinent surgical history.     Family History  Problem Relation Age of Onset  . Hypertension Mother      Social History   Tobacco Use  . Smoking status: Current Every Day Smoker    Packs/day: 0.50    Types: Cigarettes  . Smokeless tobacco: Never Used  Vaping Use  . Vaping Use: Never used  Substance Use Topics  . Alcohol use: Yes    Comment: BAC not available  . Drug use: Yes    Types: Marijuana    Comment: Last used: 2 months ago UDS NA    Home Medications Prior to Admission medications   Medication Sig Start Date End Date Taking? Authorizing Provider  RISPERDAL 1 MG tablet Take 1 tablet (1 mg total) by mouth at bedtime. 12/12/20   Caccavale, Sophia, PA-C  amantadine (SYMMETREL) 100 MG capsule Take 1 capsule (100 mg total) by mouth 2 (two) times daily. Patient not taking: Reported on 11/04/2019 09/01/19 11/17/19  [name], NP    Allergies    Haldol [haloperidol]  Review of Systems   Review of Systems  Constitutional: Negative.   HENT: Negative.   Respiratory: Negative.   Cardiovascular: Negative.   Gastrointestinal: Negative.   Musculoskeletal: Negative.   Psychiatric/Behavioral: Positive for hallucinations. Negative for self-injury and suicidal ideas.       Auditory hallucinations    Physical Exam Updated Vital Signs BP 127/85   Pulse 78   Temp 98.6 F (37 C) (Oral)   Resp 18   SpO2 98%   Physical Exam Vitals and nursing note reviewed.  HENT:     Head: Normocephalic and atraumatic.     Mouth/Throat:  Mouth: Mucous membranes are moist.     Pharynx: No oropharyngeal exudate or posterior oropharyngeal erythema.  Eyes:     General:        Right eye: No discharge.        Left eye: No discharge.     Conjunctiva/sclera: Conjunctivae normal.  Cardiovascular:     Rate and Rhythm: Normal rate and regular rhythm.     Pulses: Normal pulses.  Pulmonary:     Effort: Pulmonary effort is normal. No respiratory distress.     Breath sounds: Normal breath sounds. No wheezing or rales.  Abdominal:     General: Bowel sounds are normal. There is no distension.      Palpations: Abdomen is soft.     Tenderness: There is no abdominal tenderness.  Musculoskeletal:        General: No deformity.     Cervical back: Neck supple.  Skin:    General: Skin is warm and dry.     Capillary Refill: Capillary refill takes less than 2 seconds.  Neurological:     Mental Status: He is alert and oriented to person, place, and time. Mental status is at baseline.  Psychiatric:        Mood and Affect: Mood normal.        Speech: Speech normal.        Behavior: Behavior normal.        Thought Content: Thought content does not include homicidal or suicidal ideation. Thought content does not include homicidal or suicidal plan.     Comments: Does not appear to be responding to internal stimuli at this time.     ED Results / Procedures / Treatments   Labs (all labs ordered are listed, but only abnormal results are displayed) Labs Reviewed - No data to display  EKG None  Radiology No results found.  Procedures Procedures   Medications Ordered in ED Medications - No data to display  ED Course  I have reviewed the triage vital signs and the nursing notes.  Pertinent labs & imaging results that were available during my care of the patient were reviewed by me and considered in my medical decision making (see chart for details).    MDM Rules/Calculators/A&P                         35 year old male with history of schizophrenia presents repeatedly to the emergency department for doses of Risperdal.  Presents today requesting the same.  He has no other concerns at this time.  Vital signs and physical exam are without abnormality.  Patient is well-appearing.  Was administered morning dose of Risperdal.  Encouraged patient to follow-up with behavioral health as scheduled for this afternoon.  No indication for any further work-up at this time, no indication for psych evaluation as patient is without suicidality or homicidality.  Caison voiced understanding's  medical evaluation treatment plan.  Each of his questions were answered to his expressed infection.  Precautions given.  Patient is well-appearing, stable, and appropriate for discharge at this time.  This chart was dictated using voice recognition software, Dragon. Despite the best efforts of this provider to proofread and correct errors, errors may still occur which can change documentation meaning.  Final Clinical Impression(s) / ED Diagnoses Final diagnoses:  None    Rx / DC Orders ED Discharge Orders    None       Paris Lore, PA-C 12/23/20 [id number]  Lorre Nick, MD 12/25/20 [phone]

## 2020-12-23 NOTE — ED Notes (Signed)
Patient verbalizes understanding of discharge instructions. Opportunity for questioning and answers were provided. Armband removed by staff, pt discharged from ED and ambulated to lobby to return home.   

## 2020-12-24 ENCOUNTER — Emergency Department (HOSPITAL_COMMUNITY)
Admission: EM | Admit: 2020-12-24 | Discharge: 2020-12-24 | Disposition: A | Discharge status: Home / Self Care | Payer: Self-pay | Attending: Emergency Medicine | Admitting: Emergency Medicine

## 2020-12-24 ENCOUNTER — Encounter (HOSPITAL_COMMUNITY): Payer: Self-pay

## 2020-12-24 DIAGNOSIS — Z760 Encounter for issue of repeat prescription: Secondary | ICD-10-CM

## 2020-12-24 MED ORDER — RISPERIDONE 1 MG PO TABS
1.0000 mg | ORAL_TABLET | Freq: Once | ORAL | Status: AC
  Administered 2020-12-24: 1 mg via ORAL
  Filled 2020-12-24: qty 1

## 2020-12-24 NOTE — ED Provider Notes (Signed)
[facility]-EMERGENCY DEPT Provider Note   CSN: [id number] Arrival date & time: 12/23/20  2344     History Chief Complaint  Patient presents with  . Medication Refill    Cole Barrett is a 35 y.o. male.  The history is provided by the patient.  Medication Refill Medications/supplies requested:  Risperdal evening dose  Reason for request:  Clinic/provider not available Medications taken before: yes - see home medications   Patient has complete original prescription information: yes   Patient seen this am for morning dose of risperdal at [facility] now here for evening dose.  No associated symptoms.      Past Medical History:  Diagnosis Date  . COVID-19   . Homelessness   . Hypercholesterolemia   . Mental disorder   . Paranoid behavior (HCC)   . PE (pulmonary embolism)   . Schizophrenia [facility])     Patient Active Problem List   Diagnosis Date Noted  . Acute psychosis (HCC) 08/31/2019  . Schizophrenia (HCC) 08/31/2019  . Schizoaffective disorder, bipolar type (HCC) 01/19/2019  . Alcohol abuse   . Auditory hallucinations   . Alcohol abuse with alcohol-induced mood disorder (HCC) 09/08/2016  . Homelessness 09/03/2016  . Person feigning illness 09/03/2016  . 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 Cannabis use disorder, mild, abuse 08/29/2016  . Pulmonary emboli (HCC) 07/13/2016  . Adjustment disorder with emotional disturbance 03/12/2014  . 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 Acute left flank pain 01/02/2014  . Pulmonary embolism, bilateral (HCC) 01/02/2014    History reviewed. No pertinent surgical history.     Family History  Problem Relation Age of Onset  . Hypertension Mother     Social History   Tobacco Use  . Smoking status: Current Every Day Smoker    Packs/day: 0.50    Types: Cigarettes  . Smokeless tobacco: Never Used  Vaping Use  . Vaping Use: Never used  Substance Use Topics  . Alcohol use:  Yes    Comment: BAC not available  . Drug use: Yes    Types: Marijuana    Comment: Last used: 2 months ago UDS NA    Home Medications Prior to Admission medications   Medication Sig Start Date End Date Taking? Authorizing Provider  RISPERDAL 1 MG tablet Take 1 tablet (1 mg total) by mouth at bedtime. 12/12/20   Caccavale, Sophia, PA-C  amantadine (SYMMETREL) 100 MG capsule Take 1 capsule (100 mg total) by mouth 2 (two) times daily. Patient not taking: Reported on 11/04/2019 09/01/19 11/17/19  [name], NP    Allergies    Haldol [haloperidol]  Review of Systems   Review of Systems  Constitutional: Negative for fever.  HENT: Negative for congestion.   Eyes: Negative for visual disturbance.  Respiratory: Negative for shortness of breath.   Cardiovascular: Negative for chest pain.  Gastrointestinal: Negative for abdominal pain.  Genitourinary: Negative for difficulty urinating.  Musculoskeletal: Negative for arthralgias.  Skin: Negative for rash.  Neurological: Negative for dizziness.  Psychiatric/Behavioral: Negative for agitation.  All other systems reviewed and are negative.   Physical Exam Updated Vital Signs BP (!) 166/111 (BP Location: Left Arm)   Pulse 74   Temp 97.8 F (36.6 C) (Oral)   Resp 19   SpO2 100%   Physical Exam Vitals and nursing note reviewed.  Constitutional:      General: He is not in acute distress.    Appearance: Normal appearance.  HENT:  Head: Normocephalic and atraumatic.     Nose: Nose normal.  Eyes:     Conjunctiva/sclera: Conjunctivae normal.     Pupils: Pupils are equal, round, and reactive to light.  Cardiovascular:     Rate and Rhythm: Normal rate and regular rhythm.     Pulses: Normal pulses.     Heart sounds: Normal heart sounds.  Pulmonary:     Effort: Pulmonary effort is normal.     Breath sounds: Normal breath sounds.  Abdominal:     General: Abdomen is flat. Bowel sounds are normal.     Palpations: Abdomen is  soft.     Tenderness: There is no abdominal tenderness. There is no guarding.  Musculoskeletal:        General: Normal range of motion.     Cervical back: Normal range of motion and neck supple.  Skin:    General: Skin is warm and dry.     Capillary Refill: Capillary refill takes less than 2 seconds.  Neurological:     General: No focal deficit present.     Mental Status: He is alert and oriented to person, place, and time.     Deep Tendon Reflexes: Reflexes normal.  Psychiatric:        Mood and Affect: Mood normal.        Behavior: Behavior normal.     ED Results / Procedures / Treatments   Labs (all labs ordered are listed, but only abnormal results are displayed) Labs Reviewed - No data to display  EKG None  Radiology No results found.  Procedures Procedures   Medications Ordered in ED Medications  risperiDONE (RISPERDAL) tablet 1 mg (1 mg Oral Given 12/24/20 0107)    ED Course  I have reviewed the triage vital signs and the nursing notes.  Pertinent labs & imaging results that were available during my care of the patient were reviewed by me and considered in my medical decision making (see chart for details).   No symptoms evening dose given. Stable for discharge with close follow up    Cole Barrett was evaluated in Emergency Department on 12/24/2020 for the symptoms described in the history of present illness. He was evaluated in the context of the global COVID-19 pandemic, which necessitated consideration that the patient might be at risk for infection with the SARS-CoV-2 virus that causes COVID-19. Institutional protocols and algorithms that pertain to the evaluation of patients at risk for COVID-19 are in a state of rapid change based on information released by regulatory bodies including the CDC and federal and state organizations. These policies and algorithms were followed during the patient's care in the ED.  Final Clinical Impression(s) / ED  Diagnoses Return for intractable cough, coughing up blood, fevers >100.4 unrelieved by medication, shortness of breath, intractable vomiting, chest pain, shortness of breath, weakness, numbness, changes in speech, facial asymmetry, abdominal pain, passing out, Inability to tolerate liquids or food, cough, altered mental status or any concerns. No signs of systemic illness or infection. The patient is nontoxic-appearing on exam and vital signs are within normal limits.  I have reviewed the triage vital signs and the nursing notes. Pertinent labs & imaging results that were available during my care of the patient were reviewed by me and considered in my medical decision making (see chart for details). After history, exam, and medical workup I feel the patient has been appropriately medically screened and is safe for discharge home. Pertinent diagnoses were discussed with the patient. Patient  was given return precautions.    Jacody Beneke, MD 12/24/20 [phone]

## 2020-12-24 NOTE — ED Triage Notes (Signed)
Patient arrived stating he needs a dose of his Risperdal

## 2020-12-25 ENCOUNTER — Encounter (HOSPITAL_COMMUNITY): Payer: Self-pay | Admitting: Emergency Medicine

## 2020-12-25 ENCOUNTER — Other Ambulatory Visit: Payer: Self-pay

## 2020-12-25 ENCOUNTER — Emergency Department (HOSPITAL_COMMUNITY)
Admission: EM | Admit: 2020-12-25 | Discharge: 2020-12-25 | Disposition: A | Discharge status: Home / Self Care | Payer: Self-pay | Attending: Emergency Medicine | Admitting: Emergency Medicine

## 2020-12-25 DIAGNOSIS — Z8616 Personal history of COVID-19: Secondary | ICD-10-CM | POA: Insufficient documentation

## 2020-12-25 DIAGNOSIS — F17210 Nicotine dependence, cigarettes, uncomplicated: Secondary | ICD-10-CM | POA: Insufficient documentation

## 2020-12-25 DIAGNOSIS — Z5181 Encounter for therapeutic drug level monitoring: Secondary | ICD-10-CM | POA: Insufficient documentation

## 2020-12-25 DIAGNOSIS — Z79899 Other long term (current) drug therapy: Secondary | ICD-10-CM

## 2020-12-25 MED ORDER — RISPERIDONE 1 MG PO TABS
1.0000 mg | ORAL_TABLET | Freq: Once | ORAL | Status: AC
  Administered 2020-12-25: 1 mg via ORAL
  Filled 2020-12-25 (×2): qty 1

## 2020-12-25 NOTE — Discharge Instructions (Addendum)
1. Medications: Risperdal given here in the emergency department.   2. Treatment: rest, drink plenty of fluids, take your medications as prescribed 3. Follow Up: Please followup with your primary doctor in 7 days for discussion of your diagnoses and further evaluation after today's visit; if you do not have a primary care doctor use the resource guide provided to find one; Please return to the ER for new or worsening symptoms

## 2020-12-25 NOTE — ED Triage Notes (Addendum)
Pt reports he needs "my Risperdal."  Pt reports that he "has been out partying."

## 2020-12-25 NOTE — ED Provider Notes (Signed)
[name] EMERGENCY DEPARTMENT Provider Note   CSN: [id number] Arrival date & time: 12/25/20  0250     History Chief Complaint  Patient presents with  . needs medication    Cole Barrett is a 35 y.o. male with a hx of schizophrenia presents to the Emergency Department stating that he needs his medication.  Patient reports he takes Risperdal 1 mg every day.  He reports he was spending time with friends this afternoon and realized that he did not have his medications with him therefore he came to the emergency department to obtain his medications.  He reports he does not need a refill.  He denies hallucinations, suicidal ideation or homicidal ideation.  Patient reports he simply wants his medication before he goes crazy.  No other complaints.  The history is provided by the patient and medical records. No language interpreter was used.       Past Medical History:  Diagnosis Date  . COVID-19   . Homelessness   . Hypercholesterolemia   . Mental disorder   . Paranoid behavior (HCC)   . PE (pulmonary embolism)   . Schizophrenia [facility])     Patient Active Problem List   Diagnosis Date Noted  . Acute psychosis (HCC) 08/31/2019  . Schizophrenia (HCC) 08/31/2019  . Schizoaffective disorder, bipolar type (HCC) 01/19/2019  . Alcohol abuse   . Auditory hallucinations   . Alcohol abuse with alcohol-induced mood disorder (HCC) 09/08/2016  . Homelessness 09/03/2016  . Person feigning illness 09/03/2016  . 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 Cannabis use disorder, mild, abuse 08/29/2016  . Pulmonary emboli (HCC) 07/13/2016  . Adjustment disorder with emotional disturbance 03/12/2014  . 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 Acute left flank pain 01/02/2014  . Pulmonary embolism, bilateral (HCC) 01/02/2014    History reviewed. No pertinent surgical history.     Family History  Problem Relation Age of Onset  . Hypertension  Mother     Social History   Tobacco Use  . Smoking status: Current Every Day Smoker    Packs/day: 0.50    Types: Cigarettes  . Smokeless tobacco: Never Used  Vaping Use  . Vaping Use: Never used  Substance Use Topics  . Alcohol use: Yes    Comment: BAC not available  . Drug use: Yes    Types: Marijuana    Comment: Last used: 2 months ago UDS NA    Home Medications Prior to Admission medications   Medication Sig Start Date End Date Taking? Authorizing Provider  RISPERDAL 1 MG tablet Take 1 tablet (1 mg total) by mouth at bedtime. 12/12/20   Caccavale, Sophia, PA-C  amantadine (SYMMETREL) 100 MG capsule Take 1 capsule (100 mg total) by mouth 2 (two) times daily. Patient not taking: Reported on 11/04/2019 09/01/19 11/17/19  [name], NP    Allergies    Haldol [haloperidol]  Review of Systems   Review of Systems  Constitutional: Negative for fever.  Eyes: Negative for visual disturbance.  Psychiatric/Behavioral: The patient is not nervous/anxious.     Physical Exam Updated Vital Signs BP 136/78 (BP Location: Right Arm)   Pulse 94   Temp 98.4 F (36.9 C) (Oral)   Resp 16   Ht 5\' 10"  (1.778 m)   Wt 99.8 kg   SpO2 98%   BMI 31.57 kg/m   Physical Exam Vitals and nursing note reviewed.  Constitutional:      General: He is not in acute  distress.    Appearance: He is well-developed and well-nourished.  HENT:     Head: Normocephalic.  Eyes:     General: No scleral icterus.    Conjunctiva/sclera: Conjunctivae normal.  Cardiovascular:     Rate and Rhythm: Normal rate.     Pulses: Intact distal pulses.  Pulmonary:     Effort: Pulmonary effort is normal.  Musculoskeletal:        General: Normal range of motion.     Cervical back: Normal range of motion.  Skin:    General: Skin is warm and dry.  Neurological:     Mental Status: He is alert.  Psychiatric:        Mood and Affect: Mood normal.     ED Results / Procedures / Treatments     Procedures Procedures   Medications Ordered in ED Medications  risperiDONE (RISPERDAL) tablet 1 mg (has no administration in time range)    ED Course  I have reviewed the triage vital signs and the nursing notes.  Pertinent labs & imaging results that were available during my care of the patient were reviewed by me and considered in my medical decision making (see chart for details).    MDM Rules/Calculators/A&P                           Patient found himself without his Risperdal this evening.  Presents to the emergency department requesting a dose.  Patient provided dose here in the emergency department.  States he does not need refills.  No other symptoms.  No suicidal or homicidal ideation.   Final Clinical Impression(s) / ED Diagnoses Final diagnoses:  Medication therapy continued    Rx / DC Orders ED Discharge Orders    None       Emila Steinhauser, Boyd Kerbs 12/25/20 [id number]    [name], MD 12/25/20 [phone]

## 2020-12-26 ENCOUNTER — Other Ambulatory Visit: Payer: Self-pay

## 2020-12-26 ENCOUNTER — Encounter (HOSPITAL_COMMUNITY): Payer: Self-pay | Admitting: Emergency Medicine

## 2020-12-26 ENCOUNTER — Emergency Department (HOSPITAL_COMMUNITY)
Admission: EM | Admit: 2020-12-26 | Discharge: 2020-12-26 | Disposition: A | Discharge status: Home / Self Care | Payer: Self-pay | Attending: Emergency Medicine | Admitting: Emergency Medicine

## 2020-12-26 DIAGNOSIS — F209 Schizophrenia, unspecified: Secondary | ICD-10-CM | POA: Insufficient documentation

## 2020-12-26 DIAGNOSIS — Z8616 Personal history of COVID-19: Secondary | ICD-10-CM | POA: Insufficient documentation

## 2020-12-26 DIAGNOSIS — F17210 Nicotine dependence, cigarettes, uncomplicated: Secondary | ICD-10-CM | POA: Insufficient documentation

## 2020-12-26 DIAGNOSIS — R519 Headache, unspecified: Secondary | ICD-10-CM

## 2020-12-26 LAB — RAPID URINE DRUG SCREEN, HOSP PERFORMED
Amphetamines: NOT DETECTED
Barbiturates: NOT DETECTED
Benzodiazepines: NOT DETECTED
Cocaine: NOT DETECTED
Opiates: NOT DETECTED
Tetrahydrocannabinol: NOT DETECTED

## 2020-12-26 LAB — COMPREHENSIVE METABOLIC PANEL
ALT: 34 U/L (ref 0–44)
AST: 24 U/L (ref 15–41)
Albumin: 3.7 g/dL (ref 3.5–5.0)
Alkaline Phosphatase: 51 U/L (ref 38–126)
Anion gap: 11 (ref 5–15)
BUN: 13 mg/dL (ref 6–20)
CO2: 23 mmol/L (ref 22–32)
Calcium: 8.8 mg/dL — ABNORMAL LOW (ref 8.9–10.3)
Chloride: 105 mmol/L (ref 98–111)
Creatinine, Ser: 1.07 mg/dL (ref 0.61–1.24)
GFR, Estimated: >60 mL/min (ref >60)
Glucose, Bld: 105 mg/dL — ABNORMAL HIGH (ref 70–99)
Potassium: 3.7 mmol/L (ref 3.5–5.1)
Sodium: 139 mmol/L (ref 135–145)
Total Bilirubin: 0.6 mg/dL (ref 0.3–1.2)
Total Protein: 6.7 g/dL (ref 6.5–8.1)

## 2020-12-26 LAB — CBC
HCT: 39.1 % (ref 39.0–52.0)
Hemoglobin: 12.3 g/dL — ABNORMAL LOW (ref 13.0–17.0)
MCH: 27.4 pg (ref 26.0–34.0)
MCHC: 31.5 g/dL (ref 30.0–36.0)
MCV: 87.1 fL (ref 80.0–100.0)
Platelets: 270 10*3/uL (ref 150–400)
RBC: 4.49 MIL/uL (ref 4.22–5.81)
RDW: 12.8 % (ref 11.5–15.5)
WBC: 4.1 10*3/uL (ref 4.0–10.5)
nRBC: 0 % (ref 0.0–0.2)

## 2020-12-26 LAB — ETHANOL: Alcohol, Ethyl (B): 16 mg/dL — ABNORMAL HIGH (ref <10)

## 2020-12-26 LAB — ACETAMINOPHEN LEVEL: Acetaminophen (Tylenol), Serum: <10 ug/mL — ABNORMAL LOW (ref 10–30)

## 2020-12-26 MED ORDER — RISPERIDONE 1 MG PO TABS
1.0000 mg | ORAL_TABLET | Freq: Once | ORAL | Status: AC
  Administered 2020-12-26: 1 mg via ORAL
  Filled 2020-12-26: qty 1

## 2020-12-26 MED ORDER — ACETAMINOPHEN 325 MG PO TABS
650.0000 mg | ORAL_TABLET | Freq: Once | ORAL | Status: AC
  Administered 2020-12-26: 650 mg via ORAL
  Filled 2020-12-26: qty 2

## 2020-12-26 NOTE — ED Notes (Signed)
BH Assessment in progress at this time.

## 2020-12-26 NOTE — ED Triage Notes (Signed)
Pt presents to ED POV. Pt c/o "not feeling right." pt reports AH. Reports he has been complaint w/ meds, denies SI/HI

## 2020-12-26 NOTE — BH Assessment (Signed)
Comprehensive Clinical Assessment (CCA) Note  12/26/2020 Cole Barrett [id number]  Cole Barrett is an 35 y.o. single male who presents unaccompanied to [facility] ED reporting auditory hallucinations. Pt has a diagnosis of schizophrenia and says he woke up tonight and voices telling him they are going to kill him. Pt has presented to local EDs multiple times in the past reporting similar symptoms, in fact Pt left the ED less than 24 hours before returning. Pt denies visual hallucinations. He describes his mood as "okay" and denies depressive symptoms. He denies problems with sleep or appetite. He denies current suicidal ideation or history of suicide attempts. He denies current homicidal ideation or history of violence. He denies alcohol or other substance use.  Pt cannot identify any stressors other than auditory hallucinations. He says he lives alone in a house, that he is not homeless. 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 He says he does not have any family or friends who are supportive and does not like to be around people. He denies any legal problems. He denies history of abuse. He denies access to firearms. Pt says he take Risperdal as prescribed by providers in the ED. He says he has been a Pt at [facility] in the past but has not seen their provider in months. Pt has been referred to [facility] but has not presented for treatment. Pt reports he has been psychiatrically hospitalized in the past, most recently at [facility] in October 2020.  Pt does not identify anyone to contact for collateral information.  Pt is dressed indirty clothing, alert and oriented x4. Pt speaks in a clear tone, at moderate volume and normal pace. Motor behavior appears normal. Eye contact is good. Pt's mood iseuthymicand affect is congruent with mood. Thought process is coherent and relevant. There is no indication from Pt's behavior that heis currently  responding to internal stimuli or experiencing delusional thought content. Pt was pleasant and cooperative throughout assessment.  Chief Complaint: No chief complaint on file.  Visit Diagnosis: F20.9 Schizophrenia   DISPOSITION: Gave clinical report to [name], NP who said Pt does not meet criteria for inpatient psychiatric treatment and Pt is psychiatrically cleared for discharged. Recommendation is for Pt to follow up with [facility] for outpatient mental health treatment and crisis services. Notified Dr. April Palumbo and Lubertha Basque, RN of recommendation.   PHQ9 SCORE ONLY 12/26/2020 06/06/2020 03/13/2014  PHQ-9 Total Score 0 0 0      CCA Screening, Triage and Referral (STR)  Patient Reported Information How did you hear about [place]? Other (Comment)  Referral name: [facility]  Referral phone number: No data recorded  Whom do you see for routine medical problems? I don't have a doctor  Practice/Facility Name: No data recorded Practice/Facility Phone Number: No data recorded Name of Contact: No data recorded Contact Number: No data recorded Contact Fax Number: No data recorded Prescriber Name: No data recorded Prescriber Address (if known): No data recorded  What Is the Reason for Your Visit/Call Today? to get risperdal due to hearing voices start back last night after work  How Long Has This Been Causing You Problems? <Week  What Do You Feel Would Help You the Most Today? Medication   Have You Recently Been in Any Inpatient Treatment (Hospital/Detox/Crisis Center/28-Day Program)? No ([facility] a couple years ago)  Name/Location of Program/Hospital:No data recorded How Long Were You There? No data recorded When Were You Discharged?  No data recorded  Have You Ever Received Services From [facility] Before? Yes  Who Do You See at [facility]? ED visits & [facility] admission   Have You Recently Had Any Thoughts About Hurting Yourself?  No  Are You Planning to Commit Suicide/Harm Yourself At This time? No   Have you Recently Had Thoughts About Hurting Someone [name]? No  Explanation: No data recorded  Have You Used Any Alcohol or Drugs in the Past 24 Hours? No  How Long Ago Did You Use Drugs or Alcohol? No data recorded What Did You Use and How Much? No data recorded  Do You Currently Have a Therapist/Psychiatrist? No (no)  Name of Therapist/Psychiatrist: states no current psychiatrist; used to go to [facility]   Have You Been Recently Discharged From Any Public relations account executive or Programs? No  Explanation of Discharge From Practice/Program: No data recorded    CCA Screening Triage Referral Assessment Type of Contact: Tele-Assessment  Is this Initial or Reassessment? Initial Assessment  Date Telepsych consult ordered in [facility]:  12/01/2020  Time Telepsych consult ordered in [facility]:  0915   Patient Reported Information Reviewed? Yes  Patient Left Without Being Seen? No data recorded Reason for Not Completing Assessment: No data recorded  Collateral Involvement: pt states there is no one he can provide for collateral   Does Patient Have a Court Appointed Legal Guardian? No data recorded Name and Contact of Legal Guardian: Self  If Minor and Not Living with Parent(s), Who has Custody? NA  Is CPS involved or ever been involved? Never  Is APS involved or ever been involved? Never   Patient Determined To Be At Risk for Harm To Self or Others Based on Review of Patient Reported Information or Presenting Complaint? No  Method: No data recorded Availability of Means: No data recorded Intent: No data recorded Notification Required: No data recorded Additional Information for Danger to Others Potential: No data recorded Additional Comments for Danger to Others Potential: No data recorded Are There Guns or Other Weapons in Your Home? No data recorded Types of Guns/Weapons: No data recorded Are These Weapons Safely  Secured?                            No data recorded Who Could Verify You Are Able To Have These Secured: No data recorded Do You Have any Outstanding Charges, Pending Court Dates, Parole/Probation? No data recorded Contacted To Inform of Risk of Harm To Self or Others: Other: Comment (N/A)   [facility] of Assessment[facility])   Does Patient Present under Involuntary Commitment? No  IVC Papers Initial File Date: No data recorded  [place] of Residence: [name]   Patient Currently Receiving the Following Services: Not Receiving Services   Determination of Need: Urgent (48 hours)   Options For Referral: Medication Management; Outpatient Therapy     CCA Biopsychosocial Intake/Chief Complaint:  Recurring auditory hallucinations  Current Symptoms/Problems: Auditory hallucinations of voice telling Pt they are going to kill him.   Patient Reported Schizophrenia/Schizoaffective Diagnosis in Past: Yes   Strengths: Pt reported, relaxing, watching TV.  Preferences: Inpatient psychiatric treatment  Abilities: NA   Type of Services Patient Feels are Needed: Inpatient psychiatric treatment   Initial Clinical Notes/Concerns: Pt reports little to no social support   Mental Health Symptoms Depression:  None   Duration of Depressive symptoms: No data recorded  Mania:  Change in energy/activity   Anxiety:   Tension  Psychosis:  Hallucinations; Affective flattening/alogia/avolition   Duration of Psychotic symptoms: Greater than six months   Trauma:  None   Obsessions:  None   Compulsions:  None   Inattention:  None   Hyperactivity/Impulsivity:  N/A   Oppositional/Defiant Behaviors:  None   Emotional Irregularity:  N/A   Other Mood/Personality Symptoms:  No data recorded   Mental Status Exam Appearance and self-care  Stature:  Average   Weight:  Overweight   Clothing:  Casual   Grooming:  Normal   Cosmetic use:  None   Posture/gait:  Normal    Motor activity:  Not Remarkable   Sensorium  Attention:  Normal   Concentration:  Normal   Orientation:  X5   Recall/memory:  Normal   Affect and Mood  Affect:  Constricted   Mood:  Euthymic   Relating  Eye contact:  Normal   Facial expression:  Constricted   Attitude toward examiner:  Cooperative   Thought and Language  Speech flow: Clear and Coherent   Thought content:  Appropriate to Mood and Circumstances   Preoccupation:  None   Hallucinations:  Auditory (Hearing voices saying they are going to kill him)   Organization:  No data recorded  [employer] of Knowledge:  Average   Intelligence:  Average   Abstraction:  Normal   Judgement:  Good   Reality Testing:  Adequate   Insight:  Fair; Gaps   Decision Making:  Normal   Social Functioning  Social Maturity:  Isolates; Responsible   Social Judgement:  Normal   Stress  Stressors:  Other (Comment) (Hallucinations)   Coping Ability:  Deficient supports   Skill Deficits:  None   Supports:  Other (Comment) Probation officer)     Religion: Religion/Spirituality Are You A Religious Person?: No  Leisure/Recreation: Leisure / Recreation Do You Have Hobbies?: No  Exercise/Diet: Exercise/Diet Do You Exercise?: No Have You Gained or Lost A Significant Amount of Weight in the Past Six Months?: No Do You Follow a Special Diet?: No Do You Have Any Trouble Sleeping?: No   CCA Employment/Education Employment/Work Situation: Employment / Work Psychologist, occupational Where is patient currently employed?: [employer] How long has patient been employed?: 5 years Patient's job has been impacted by current illness: No What is the longest time patient has a held a job?: 5 Years 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 Has patient ever been in the [employer]?: No  Education: Education Is Patient Currently Attending School?: No Did Garment/textile technologist From [employer]?: Yes Did Lawyer?: No Did Designer, television/film set?: No Did You Have An Individualized Education Program (IIEP): No Did You Have Any Difficulty At [employer]?: No Patient's Education Has Been Impacted by Current Illness: No   CCA Family/Childhood History Family and Relationship History: Family history Marital status: Single Are you sexually active?: Yes What is your sexual orientation?: Heterosexual  Has your sexual activity been affected by drugs, alcohol, medication, or emotional stress?: N/A  Does patient have children?: No  Childhood History:  Childhood History By whom was/is the patient raised?: Both parents Description of patient's relationship with caregiver when they were a child: Good Patient's description of current relationship with people who raised him/her: Good How were you disciplined when you got in trouble as a child/adolescent?: NA Does patient have siblings?: Yes Number of Siblings: 4 Description of patient's current relationship with siblings: 4 brothers, little contact with them Did patient suffer any verbal/emotional/physical/sexual abuse  as a child?: No Did patient suffer from severe childhood neglect?: No Has patient ever been sexually abused/assaulted/raped as an adolescent or adult?: No Was the patient ever a victim of a crime or a disaster?: No Witnessed domestic violence?: No Has patient been affected by domestic violence as an adult?: No  Child/Adolescent Assessment:     CCA Substance Use Alcohol/Drug Use: Alcohol / Drug Use Pain Medications: denies Prescriptions: Denies abuse Over the Counter: none reported History of alcohol / drug use?: No history of alcohol / drug abuse Longest period of sobriety (when/how long): N/A                         ASAM's:  Six Dimensions of Multidimensional Assessment  Dimension 1:  Acute Intoxication and/or Withdrawal Potential:      Dimension 2:  Biomedical Conditions and Complications:      Dimension 3:   Emotional, Behavioral, or Cognitive Conditions and Complications:     Dimension 4:  Readiness to Change:     Dimension 5:  Relapse, Continued use, or Continued Problem Potential:     Dimension 6:  Recovery/Living Environment:     ASAM Severity Score:    ASAM Recommended Level of Treatment:     Substance use Disorder (SUD)    Recommendations for Services/Supports/Treatments:    DSM5 Diagnoses: Patient Active Problem List   Diagnosis Date Noted  . Acute psychosis (HCC) 08/31/2019  . Schizophrenia (HCC) 08/31/2019  . Schizoaffective disorder, bipolar type (HCC) 01/19/2019  . Alcohol abuse   . Auditory hallucinations   . Alcohol abuse with alcohol-induced mood disorder (HCC) 09/08/2016  . Homelessness 09/03/2016  . Person feigning illness 09/03/2016  . 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 Cannabis use disorder, mild, abuse 08/29/2016  . Pulmonary emboli (HCC) 07/13/2016  . Adjustment disorder with emotional disturbance 03/12/2014  . 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 Acute left flank pain 01/02/2014  . Pulmonary embolism, bilateral (HCC) 01/02/2014    Patient Centered Plan: Patient is on the following Treatment Plan(s):  Anxiety   Referrals to Alternative Service(s): Referred to Alternative Service(s):   Place:   Date:   Time:    Referred to Alternative Service(s):   Place:   Date:   Time:    Referred to Alternative Service(s):   Place:   Date:   Time:    Referred to Alternative Service(s):   Place:   Date:   Time:     Pamalee Leyden, [facility]

## 2020-12-26 NOTE — ED Provider Notes (Signed)
[name] EMERGENCY DEPARTMENT Provider Note   CSN: [id number] Arrival date & time: 12/26/20  0057     History No chief complaint on file.   Cole Barrett is a 35 y.o. male.  The history is provided by the patient.  Mental Health Problem Presenting symptoms: no aggressive behavior, no agitation, no bizarre behavior, no delusions, no depression, no disorganized speech, no disorganized thought process, no hallucinations, no homicidal ideas, no paranoid behavior, no self-mutilation, no suicidal thoughts, no suicidal threats and no suicide attempt   Presenting symptoms comment:  Hearing voices  Degree of incapacity (severity):  Mild Onset quality:  Gradual Timing:  Constant Progression:  Unchanged Chronicity:  Recurrent Context: not noncompliant, not recent medication change and not stressful life event   Treatment compliance:  All of the time Relieved by:  Nothing Worsened by:  Nothing Ineffective treatments:  None tried Associated symptoms: no abdominal pain, no anhedonia, no anxiety, no appetite change, no chest pain, no decreased need for sleep, not distractible, no euphoric mood, no fatigue, no feelings of worthlessness, no headaches, no hypersomnia, no hyperventilation, no insomnia, no irritability, no poor judgment, no school problems and no weight change   Risk factors: hx of mental illness        Past Medical History:  Diagnosis Date  . COVID-19   . Homelessness   . Hypercholesterolemia   . Mental disorder   . Paranoid behavior (HCC)   . PE (pulmonary embolism)   . Schizophrenia [facility])     Patient Active Problem List   Diagnosis Date Noted  . Acute psychosis (HCC) 08/31/2019  . Schizophrenia (HCC) 08/31/2019  . Schizoaffective disorder, bipolar type (HCC) 01/19/2019  . Alcohol abuse   . Auditory hallucinations   . Alcohol abuse with alcohol-induced mood disorder (HCC) 09/08/2016  . Homelessness 09/03/2016  . Person feigning  illness 09/03/2016  . 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 Cannabis use disorder, mild, abuse 08/29/2016  . Pulmonary emboli (HCC) 07/13/2016  . Adjustment disorder with emotional disturbance 03/12/2014  . 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 Acute left flank pain 01/02/2014  . Pulmonary embolism, bilateral (HCC) 01/02/2014    History reviewed. No pertinent surgical history.     Family History  Problem Relation Age of Onset  . Hypertension Mother     Social History   Tobacco Use  . Smoking status: Current Every Day Smoker    Packs/day: 0.50    Types: Cigarettes  . Smokeless tobacco: Never Used  Vaping Use  . Vaping Use: Never used  Substance Use Topics  . Alcohol use: Yes    Comment: BAC not available  . Drug use: Yes    Types: Marijuana    Comment: Last used: 2 months ago UDS NA    Home Medications Prior to Admission medications   Medication Sig Start Date End Date Taking? Authorizing Provider  RISPERDAL 1 MG tablet Take 1 tablet (1 mg total) by mouth at bedtime. 12/12/20   Caccavale, Sophia, PA-C  amantadine (SYMMETREL) 100 MG capsule Take 1 capsule (100 mg total) by mouth 2 (two) times daily. Patient not taking: Reported on 11/04/2019 09/01/19 11/17/19  [name], NP    Allergies    Haldol [haloperidol]  Review of Systems   Review of Systems  Constitutional: Negative for appetite change, fatigue and irritability.  HENT: Negative for congestion.   Eyes: Negative for visual disturbance.  Respiratory: Negative for shortness of breath.   Cardiovascular: Negative  for chest pain.  Gastrointestinal: Negative for abdominal pain.  Genitourinary: Negative for difficulty urinating.  Musculoskeletal: Negative for arthralgias.  Skin: Negative for color change.  Neurological: Negative for headaches.  Psychiatric/Behavioral: Negative for agitation, hallucinations, homicidal ideas, paranoia, self-injury and suicidal ideas. The patient is not  nervous/anxious and does not have insomnia.   All other systems reviewed and are negative.   Physical Exam Updated Vital Signs BP (!) 141/84 (BP Location: Left Arm)   Pulse 95   Temp 98.3 F (36.8 C) (Oral)   Resp 16   SpO2 99%   Physical Exam Vitals and nursing note reviewed.  Constitutional:      General: He is not in acute distress.    Appearance: Normal appearance.  HENT:     Head: Normocephalic and atraumatic.     Nose: Nose normal.  Eyes:     Conjunctiva/sclera: Conjunctivae normal.     Pupils: Pupils are equal, round, and reactive to light.  Cardiovascular:     Rate and Rhythm: Normal rate and regular rhythm.     Pulses: Normal pulses.     Heart sounds: Normal heart sounds.  Pulmonary:     Effort: Pulmonary effort is normal.     Breath sounds: Normal breath sounds.  Abdominal:     General: Abdomen is flat.     Palpations: Abdomen is soft.     Tenderness: There is no abdominal tenderness. There is no guarding.  Musculoskeletal:        General: Normal range of motion.     Cervical back: Normal range of motion and neck supple.  Skin:    General: Skin is warm and dry.     Capillary Refill: Capillary refill takes less than 2 seconds.  Neurological:     General: No focal deficit present.     Mental Status: He is alert and oriented to person, place, and time.  Psychiatric:        Mood and Affect: Mood normal.        Behavior: Behavior normal.        Thought Content: Thought content normal.     ED Results / Procedures / Treatments   Labs (all labs ordered are listed, but only abnormal results are displayed) Results for orders placed or performed during the hospital encounter of 12/26/20  cbc  Result Value Ref Range   WBC 4.1 4.0 - 10.5 K/uL   RBC 4.49 4.22 - 5.81 MIL/uL   Hemoglobin 12.3 (L) 13.0 - 17.0 g/dL   HCT 62.9 52.8 - 41.3 %   MCV 87.1 80.0 - 100.0 fL   MCH 27.4 26.0 - 34.0 pg   MCHC 31.5 30.0 - 36.0 g/dL   RDW 24.4 01.0 - 27.2 %   Platelets 270  150 - 400 K/uL   nRBC 0.0 0.0 - 0.2 %   No results found.  Radiology No results found.  Procedures Procedures   Medications Ordered in ED Medications  acetaminophen (TYLENOL) tablet 650 mg (has no administration in time range)    ED Course  I have reviewed the triage vital signs and the nursing notes.  Pertinent labs & imaging results that were available during my care of the patient were reviewed by me and considered in my medical decision making (see chart for details).    No SI or HI.  Patient will be treated. Patient has been cleared by TTS  [name] was evaluated in Emergency Department on 12/26/2020 for the symptoms described in  the history of present illness. He was evaluated in the context of the global COVID-19 pandemic, which necessitated consideration that the patient might be at risk for infection with the SARS-CoV-2 virus that causes COVID-19. Institutional protocols and algorithms that pertain to the evaluation of patients at risk for COVID-19 are in a state of rapid change based on information released by regulatory bodies including the CDC and federal and state organizations. These policies and algorithms were followed during the patient's care in the ED.  Final Clinical Impression(s) / ED Diagnoses Return for intractable cough, coughing up blood, fevers >100.4 unrelieved by medication, shortness of breath, intractable vomiting, chest pain, shortness of breath, weakness, numbness, changes in speech, facial asymmetry, abdominal pain, passing out, Inability to tolerate liquids or food, cough, altered mental status or any concerns. No signs of systemic illness or infection. The patient is nontoxic-appearing on exam and vital signs are within normal limits.  I have reviewed the triage vital signs and the nursing notes. Pertinent labs & imaging results that were available during my care of the patient were reviewed by me and considered in my medical decision  making (see chart for details). After history, exam, and medical workup I feel the patient has been appropriately medically screened and is safe for discharge home. Pertinent diagnoses were discussed with the patient. Patient was given return precautions.    Ineze Serrao, MD 12/26/20 [phone]

## 2020-12-27 ENCOUNTER — Encounter (HOSPITAL_COMMUNITY): Payer: Self-pay

## 2020-12-27 ENCOUNTER — Other Ambulatory Visit: Payer: Self-pay

## 2020-12-27 ENCOUNTER — Emergency Department (HOSPITAL_COMMUNITY)
Admission: EM | Admit: 2020-12-27 | Discharge: 2020-12-27 | Disposition: A | Discharge status: Home / Self Care | Payer: Self-pay | Attending: Emergency Medicine | Admitting: Emergency Medicine

## 2020-12-27 DIAGNOSIS — Z760 Encounter for issue of repeat prescription: Secondary | ICD-10-CM | POA: Insufficient documentation

## 2020-12-27 DIAGNOSIS — F17210 Nicotine dependence, cigarettes, uncomplicated: Secondary | ICD-10-CM | POA: Insufficient documentation

## 2020-12-27 DIAGNOSIS — Z8616 Personal history of COVID-19: Secondary | ICD-10-CM | POA: Insufficient documentation

## 2020-12-27 MED ORDER — RISPERIDONE 1 MG PO TABS
1.0000 mg | ORAL_TABLET | Freq: Once | ORAL | Status: AC
  Administered 2020-12-27: 1 mg via ORAL
  Filled 2020-12-27: qty 1

## 2020-12-27 MED ORDER — RISPERDAL 1 MG PO TABS: 1.0000 mg | ORAL_TABLET | Freq: Every day | ORAL | 0 refills | Status: DC

## 2020-12-27 NOTE — ED Triage Notes (Signed)
Pt needs Risperdal. Denies SI/HI

## 2020-12-27 NOTE — ED Provider Notes (Signed)
[name] EMERGENCY DEPARTMENT Provider Note   CSN: [id number] Arrival date & time: 12/27/20  0125     History Chief Complaint  Patient presents with  . Medication Refill    Cole Barrett is a 35 y.o. male.  HPI Patient is 35 year old male with a history of schizophrenia and homelessness presenting today with request for his home Risperdal.  He states that he takes his medication 1 mg every day.  He states that he went to a party last night and forgot his medication.  He states he does not have a refill.  He repeatedly states "oh I have out at home "but that he will not be able to get it until tomorrow.  He states that he has no issue affording his medications.  He states that he has not at home.  He denies any HI, SI, AVH, confusion, anxiety today.  He states that he wants his medication today and feels that he cannot wait until tomorrow to take his home medications because he is afraid "something will happen".  Denies any other complaints.     Past Medical History:  Diagnosis Date  . COVID-19   . Homelessness   . Hypercholesterolemia   . Mental disorder   . Paranoid behavior (HCC)   . PE (pulmonary embolism)   . Schizophrenia [facility])     Patient Active Problem List   Diagnosis Date Noted  . Acute psychosis (HCC) 08/31/2019  . Schizophrenia (HCC) 08/31/2019  . Schizoaffective disorder, bipolar type (HCC) 01/19/2019  . Alcohol abuse   . Auditory hallucinations   . Alcohol abuse with alcohol-induced mood disorder (HCC) 09/08/2016  . Homelessness 09/03/2016  . Person feigning illness 09/03/2016  . 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 Cannabis use disorder, mild, abuse 08/29/2016  . Pulmonary emboli (HCC) 07/13/2016  . Adjustment disorder with emotional disturbance 03/12/2014  . 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 Acute left flank pain 01/02/2014  . Pulmonary embolism, bilateral (HCC) 01/02/2014    No past  surgical history on file.     Family History  Problem Relation Age of Onset  . Hypertension Mother     Social History   Tobacco Use  . Smoking status: Current Every Day Smoker    Packs/day: 0.50    Types: Cigarettes  . Smokeless tobacco: Never Used  Vaping Use  . Vaping Use: Never used  Substance Use Topics  . Alcohol use: Yes    Comment: BAC not available  . Drug use: Yes    Types: Marijuana    Comment: Last used: 2 months ago UDS NA    Home Medications Prior to Admission medications   Medication Sig Start Date End Date Taking? Authorizing Provider  RISPERDAL 1 MG tablet Take 1 tablet (1 mg total) by mouth at bedtime. 12/27/20   Gailen Shelter, PA  amantadine (SYMMETREL) 100 MG capsule Take 1 capsule (100 mg total) by mouth 2 (two) times daily. Patient not taking: Reported on 11/04/2019 09/01/19 11/17/19  [name], NP    Allergies    Haldol [haloperidol]  Review of Systems   Review of Systems  Constitutional: Negative for chills and fever.  HENT: Negative for congestion.   Respiratory: Negative for shortness of breath.   Cardiovascular: Negative for chest pain.  Gastrointestinal: Negative for abdominal pain.  Musculoskeletal: Negative for neck pain.    Physical Exam Updated Vital Signs BP 114/75   Pulse 86   Temp 98.8 F (37.1  C) (Oral)   Resp 18   SpO2 98%   Physical Exam Vitals and nursing note reviewed.  Constitutional:      General: He is not in acute distress.    Appearance: Normal appearance. He is not ill-appearing.  HENT:     Head: Normocephalic and atraumatic.  Eyes:     General: No scleral icterus.       Right eye: No discharge.        Left eye: No discharge.     Conjunctiva/sclera: Conjunctivae normal.  Pulmonary:     Effort: Pulmonary effort is normal.     Breath sounds: No stridor.  Neurological:     Mental Status: He is alert and oriented to person, place, and time. Mental status is at baseline.  Psychiatric:        Mood  and Affect: Mood normal.        Behavior: Behavior normal.     ED Results / Procedures / Treatments   Labs (all labs ordered are listed, but only abnormal results are displayed) Labs Reviewed - No data to display  EKG None  Radiology No results found.  Procedures Procedures  Medications Ordered in ED Medications  risperiDONE (RISPERDAL) tablet 1 mg (has no administration in time range)    ED Course  I have reviewed the triage vital signs and the nursing notes.  Pertinent labs & imaging results that were available during my care of the patient were reviewed by me and considered in my medical decision making (see chart for details).    MDM Rules/Calculators/A&P                          Patient seen here numerous times for Risperdal. Patient denies any issues filling his Risperdal he states he has some at home.  He declines refill however will provide him with 1 refill for 5-day course of Risperdal given my concern that he will return tomorrow for refill.  He assures me he will not.  He denies any SI, HI, AVH.  Patient given his 1 mg tablet of Risperdal.  Final Clinical Impression(s) / ED Diagnoses Final diagnoses:  Medication refill    Rx / DC Orders ED Discharge Orders         Ordered    RISPERDAL 1 MG tablet  Daily at bedtime        12/27/20 0750           Gailen Shelter, PA 12/27/20 1350    Derwood Kaplan, MD 12/29/20 1136

## 2020-12-27 NOTE — ED Provider Notes (Signed)
[facility]-EMERGENCY DEPT Provider Note   CSN: [id number] Arrival date & time: 12/27/20  2220     History Chief Complaint  Patient presents with  . Medication Refill    Cole Barrett is a 35 y.o. male with past medical history as listed below.  HPI Patient presents to emergency department with chief complaint of medication refill. He is well known to emergency department coming in almost nightly for Risperdal.  No complaints. No suicidal or homicidal ideations.     Past Medical History:  Diagnosis Date  . COVID-19   . Homelessness   . Hypercholesterolemia   . Mental disorder   . Paranoid behavior (HCC)   . PE (pulmonary embolism)   . Schizophrenia [facility])     Patient Active Problem List   Diagnosis Date Noted  . Acute psychosis (HCC) 08/31/2019  . Schizophrenia (HCC) 08/31/2019  . Schizoaffective disorder, bipolar type (HCC) 01/19/2019  . Alcohol abuse   . Auditory hallucinations   . Alcohol abuse with alcohol-induced mood disorder (HCC) 09/08/2016  . Homelessness 09/03/2016  . Person feigning illness 09/03/2016  . 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 Cannabis use disorder, mild, abuse 08/29/2016  . Pulmonary emboli (HCC) 07/13/2016  . Adjustment disorder with emotional disturbance 03/12/2014  . 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 Acute left flank pain 01/02/2014  . Pulmonary embolism, bilateral (HCC) 01/02/2014    History reviewed. No pertinent surgical history.     Family History  Problem Relation Age of Onset  . Hypertension Mother     Social History   Tobacco Use  . Smoking status: Current Every Day Smoker    Packs/day: 0.50    Types: Cigarettes  . Smokeless tobacco: Never Used  Vaping Use  . Vaping Use: Never used  Substance Use Topics  . Alcohol use: Yes    Comment: BAC not available  . Drug use: Yes    Types: Marijuana    Comment: Last used: 2 months ago UDS NA    Home  Medications Prior to Admission medications   Medication Sig Start Date End Date Taking? Authorizing Provider  RISPERDAL 1 MG tablet Take 1 tablet (1 mg total) by mouth at bedtime. 12/27/20   Gailen Shelter, PA  amantadine (SYMMETREL) 100 MG capsule Take 1 capsule (100 mg total) by mouth 2 (two) times daily. Patient not taking: Reported on 11/04/2019 09/01/19 11/17/19  [name], NP    Allergies    Haldol [haloperidol]  Review of Systems   Review of Systems All other systems are reviewed and are negative for acute change except as noted in the HPI.  Physical Exam Updated Vital Signs BP (!) 154/84 (BP Location: Right Arm)   Pulse (!) 101   Temp 98 F (36.7 C) (Oral)   Ht 5\' 10"  (1.778 m)   Wt 99.8 kg   SpO2 94%   BMI 31.57 kg/m   Physical Exam Vitals and nursing note reviewed.  Constitutional:      Appearance: He is well-developed. He is not ill-appearing or toxic-appearing.  HENT:     Head: Normocephalic and atraumatic.     Nose: Nose normal.  Eyes:     General: No scleral icterus.       Right eye: No discharge.        Left eye: No discharge.     Conjunctiva/sclera: Conjunctivae normal.  Neck:     Vascular: No JVD.  Cardiovascular:     Rate and  Rhythm: Normal rate and regular rhythm.     Pulses: Normal pulses.     Heart sounds: Normal heart sounds.  Pulmonary:     Effort: Pulmonary effort is normal.     Breath sounds: Normal breath sounds.  Abdominal:     General: There is no distension.  Musculoskeletal:        General: Normal range of motion.     Cervical back: Normal range of motion.  Skin:    General: Skin is warm and dry.  Neurological:     Mental Status: He is oriented to person, place, and time.     GCS: GCS eye subscore is 4. GCS verbal subscore is 5. GCS motor subscore is 6.     Comments: Fluent speech, no facial droop.  Psychiatric:        Behavior: Behavior normal.        Thought Content: Thought content does not include homicidal or  suicidal ideation.     ED Results / Procedures / Treatments   Labs (all labs ordered are listed, but only abnormal results are displayed) Labs Reviewed - No data to display  EKG None  Radiology No results found.  Procedures Procedures   Medications Ordered in ED Medications  risperiDONE (RISPERDAL) tablet 1 mg (1 mg Oral Given 12/27/20 2324)    ED Course  I have reviewed the triage vital signs and the nursing notes.  Pertinent labs & imaging results that were available during my care of the patient were reviewed by me and considered in my medical decision making (see chart for details).    MDM Rules/Calculators/A&P                           History provided by patient with additional history obtained from chart review.    Well appearing male in no acute distress requesting nightly dose of Risperadol. Respiratory rate not documented in triage vitals. On exam he has normal work of breathing 18 respiration per minute. 56 Ed visits in 6 months. Discussed with patient ED is not the place for medication refill. Patient has plan to get medication tomorrow as he is moving into new house. Discharged home in stable conditions.    Portions of this note were generated with Scientist, clinical (histocompatibility and immunogenetics). Dictation errors may occur despite best attempts at proofreading.   Final Clinical Impression(s) / ED Diagnoses Final diagnoses:  Encounter for medication refill    Rx / DC Orders ED Discharge Orders    None       Kandice Hams 12/28/20 0008    Wynetta Fines, MD 12/30/20 2258

## 2020-12-27 NOTE — ED Provider Notes (Incomplete)
[facility]-EMERGENCY DEPT Provider Note   CSN: [id number] Arrival date & time: 12/27/20  2220     History Chief Complaint  Patient presents with  . Medication Refill    Manuelito Poage is a 35 y.o. male with past medical history as listed below.  HPI Patient presents to emergency department with chief complaint of medication refill. He is well known to emergency department coming in almost nightly for Risperdal.      Past Medical History:  Diagnosis Date  . COVID-19   . Homelessness   . Hypercholesterolemia   . Mental disorder   . Paranoid behavior (HCC)   . PE (pulmonary embolism)   . Schizophrenia [facility])     Patient Active Problem List   Diagnosis Date Noted  . Acute psychosis (HCC) 08/31/2019  . Schizophrenia (HCC) 08/31/2019  . Schizoaffective disorder, bipolar type (HCC) 01/19/2019  . Alcohol abuse   . Auditory hallucinations   . Alcohol abuse with alcohol-induced mood disorder (HCC) 09/08/2016  . Homelessness 09/03/2016  . Person feigning illness 09/03/2016  . 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 Cannabis use disorder, mild, abuse 08/29/2016  . Pulmonary emboli (HCC) 07/13/2016  . Adjustment disorder with emotional disturbance 03/12/2014  . 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 Acute left flank pain 01/02/2014  . Pulmonary embolism, bilateral (HCC) 01/02/2014    History reviewed. No pertinent surgical history.     Family History  Problem Relation Age of Onset  . Hypertension Mother     Social History   Tobacco Use  . Smoking status: Current Every Day Smoker    Packs/day: 0.50    Types: Cigarettes  . Smokeless tobacco: Never Used  Vaping Use  . Vaping Use: Never used  Substance Use Topics  . Alcohol use: Yes    Comment: BAC not available  . Drug use: Yes    Types: Marijuana    Comment: Last used: 2 months ago UDS NA    Home Medications Prior to Admission medications   Medication Sig  Start Date End Date Taking? Authorizing Provider  RISPERDAL 1 MG tablet Take 1 tablet (1 mg total) by mouth at bedtime. 12/27/20   Gailen Shelter, PA  amantadine (SYMMETREL) 100 MG capsule Take 1 capsule (100 mg total) by mouth 2 (two) times daily. Patient not taking: Reported on 11/04/2019 09/01/19 11/17/19  [name], NP    Allergies    Haldol [haloperidol]  Review of Systems   Review of Systems All other systems are reviewed and are negative for acute change except as noted in the HPI.  Physical Exam Updated Vital Signs BP (!) 154/84 (BP Location: Right Arm)   Pulse (!) 101   Temp 98 F (36.7 C) (Oral)   Ht 5\' 10"  (1.778 m)   Wt 99.8 kg   SpO2 94%   BMI 31.57 kg/m   Physical Exam Vitals and nursing note reviewed.  Constitutional:      Appearance: He is well-developed. He is not ill-appearing or toxic-appearing.  HENT:     Head: Normocephalic and atraumatic.     Nose: Nose normal.  Eyes:     General: No scleral icterus.       Right eye: No discharge.        Left eye: No discharge.     Conjunctiva/sclera: Conjunctivae normal.  Neck:     Vascular: No JVD.  Cardiovascular:     Rate and Rhythm: Normal rate and regular rhythm.  Pulses: Normal pulses.     Heart sounds: Normal heart sounds.  Pulmonary:     Effort: Pulmonary effort is normal.     Breath sounds: Normal breath sounds.  Abdominal:     General: There is no distension.  Musculoskeletal:        General: Normal range of motion.     Cervical back: Normal range of motion.  Skin:    General: Skin is warm and dry.  Neurological:     Mental Status: He is oriented to person, place, and time.     GCS: GCS eye subscore is 4. GCS verbal subscore is 5. GCS motor subscore is 6.     Comments: Fluent speech, no facial droop.  Psychiatric:        Behavior: Behavior normal.        Thought Content: Thought content does not include homicidal or suicidal ideation.     ED Results / Procedures / Treatments    Labs (all labs ordered are listed, but only abnormal results are displayed) Labs Reviewed - No data to display  EKG None  Radiology No results found.  Procedures Procedures {Remember to document critical care time when appropriate:1}  Medications Ordered in ED Medications - No data to display  ED Course  I have reviewed the triage vital signs and the nursing notes.  Pertinent labs & imaging results that were available during my care of the patient were reviewed by me and considered in my medical decision making (see chart for details).    MDM Rules/Calculators/A&P                           History provided by patient with additional history obtained from chart review.    Here for nightly medication refill. Discussed with patient ED is not the place   Final Clinical Impression(s) / ED Diagnoses Final diagnoses:  None    Rx / DC Orders ED Discharge Orders    None

## 2020-12-27 NOTE — Discharge Instructions (Signed)
Please follow-up with your psychiatrist

## 2020-12-27 NOTE — ED Triage Notes (Signed)
Patient requested a dose of his Risperdal.

## 2020-12-29 ENCOUNTER — Emergency Department (HOSPITAL_COMMUNITY)
Admission: EM | Admit: 2020-12-29 | Discharge: 2020-12-30 | Disposition: A | Discharge status: Home / Self Care | Payer: Self-pay | Attending: Emergency Medicine | Admitting: Emergency Medicine

## 2020-12-29 DIAGNOSIS — R519 Headache, unspecified: Secondary | ICD-10-CM | POA: Insufficient documentation

## 2020-12-29 DIAGNOSIS — Z5321 Procedure and treatment not carried out due to patient leaving prior to being seen by health care provider: Secondary | ICD-10-CM | POA: Insufficient documentation

## 2020-12-29 DIAGNOSIS — F600 Paranoid personality disorder: Secondary | ICD-10-CM | POA: Insufficient documentation

## 2020-12-30 ENCOUNTER — Other Ambulatory Visit: Payer: Self-pay

## 2020-12-30 ENCOUNTER — Encounter (HOSPITAL_COMMUNITY): Payer: Self-pay | Admitting: *Deleted

## 2020-12-30 LAB — COMPREHENSIVE METABOLIC PANEL
ALT: 35 U/L (ref 0–44)
AST: 30 U/L (ref 15–41)
Albumin: 3.8 g/dL (ref 3.5–5.0)
Alkaline Phosphatase: 49 U/L (ref 38–126)
Anion gap: 12 (ref 5–15)
BUN: 12 mg/dL (ref 6–20)
CO2: 23 mmol/L (ref 22–32)
Calcium: 8.7 mg/dL — ABNORMAL LOW (ref 8.9–10.3)
Chloride: 102 mmol/L (ref 98–111)
Creatinine, Ser: 0.96 mg/dL (ref 0.61–1.24)
GFR, Estimated: >60 mL/min (ref >60)
Glucose, Bld: 109 mg/dL — ABNORMAL HIGH (ref 70–99)
Potassium: 3.9 mmol/L (ref 3.5–5.1)
Sodium: 137 mmol/L (ref 135–145)
Total Bilirubin: 0.4 mg/dL (ref 0.3–1.2)
Total Protein: 6.7 g/dL (ref 6.5–8.1)

## 2020-12-30 LAB — CBC
HCT: 40.4 % (ref 39.0–52.0)
Hemoglobin: 13.1 g/dL (ref 13.0–17.0)
MCH: 28.2 pg (ref 26.0–34.0)
MCHC: 32.4 g/dL (ref 30.0–36.0)
MCV: 86.9 fL (ref 80.0–100.0)
Platelets: 284 10*3/uL (ref 150–400)
RBC: 4.65 MIL/uL (ref 4.22–5.81)
RDW: 12.7 % (ref 11.5–15.5)
WBC: 5 10*3/uL (ref 4.0–10.5)
nRBC: 0 % (ref 0.0–0.2)

## 2020-12-30 LAB — RAPID URINE DRUG SCREEN, HOSP PERFORMED
Amphetamines: NOT DETECTED
Barbiturates: NOT DETECTED
Benzodiazepines: NOT DETECTED
Cocaine: NOT DETECTED
Opiates: NOT DETECTED
Tetrahydrocannabinol: NOT DETECTED

## 2020-12-30 LAB — ETHANOL: Alcohol, Ethyl (B): <10 mg/dL (ref <10)

## 2020-12-30 NOTE — ED Triage Notes (Signed)
Pt says that he is hearing voices, just "wants to talk to a counseler tonight". Reports headache, has been taking his medications as prescribed. Denies SI/HI. Alert and oriented, calm.

## 2020-12-31 ENCOUNTER — Other Ambulatory Visit: Payer: Self-pay

## 2020-12-31 ENCOUNTER — Emergency Department (HOSPITAL_COMMUNITY)
Admission: EM | Admit: 2020-12-31 | Discharge: 2020-12-31 | Disposition: A | Discharge status: Home / Self Care | Payer: Self-pay | Attending: Emergency Medicine | Admitting: Emergency Medicine

## 2020-12-31 ENCOUNTER — Encounter (HOSPITAL_COMMUNITY): Payer: Self-pay | Admitting: *Deleted

## 2020-12-31 DIAGNOSIS — Z046 Encounter for general psychiatric examination, requested by authority: Secondary | ICD-10-CM | POA: Insufficient documentation

## 2020-12-31 DIAGNOSIS — Z5321 Procedure and treatment not carried out due to patient leaving prior to being seen by health care provider: Secondary | ICD-10-CM | POA: Insufficient documentation

## 2020-12-31 NOTE — ED Notes (Signed)
Per registration, pt left.  

## 2020-12-31 NOTE — ED Triage Notes (Signed)
The pt is here every night for different complaints.  He just comes to sleep

## 2021-01-02 ENCOUNTER — Other Ambulatory Visit: Payer: Self-pay

## 2021-01-02 ENCOUNTER — Emergency Department (HOSPITAL_COMMUNITY)
Admission: EM | Admit: 2021-01-02 | Discharge: 2021-01-02 | Discharge status: Against Medical Advice | Payer: Self-pay | Attending: Emergency Medicine | Admitting: Emergency Medicine

## 2021-01-02 ENCOUNTER — Emergency Department (HOSPITAL_COMMUNITY)
Admission: EM | Admit: 2021-01-02 | Discharge: 2021-01-03 | Disposition: A | Discharge status: Home / Self Care | Payer: Self-pay | Attending: Emergency Medicine | Admitting: Emergency Medicine

## 2021-01-02 ENCOUNTER — Encounter (HOSPITAL_COMMUNITY): Payer: Self-pay | Admitting: *Deleted

## 2021-01-02 DIAGNOSIS — Z5321 Procedure and treatment not carried out due to patient leaving prior to being seen by health care provider: Secondary | ICD-10-CM | POA: Insufficient documentation

## 2021-01-02 DIAGNOSIS — Z046 Encounter for general psychiatric examination, requested by authority: Secondary | ICD-10-CM | POA: Insufficient documentation

## 2021-01-02 DIAGNOSIS — Z760 Encounter for issue of repeat prescription: Secondary | ICD-10-CM | POA: Insufficient documentation

## 2021-01-02 NOTE — ED Triage Notes (Signed)
Pt reports that he wants to speak to counselor. Reports he already had his medication today and wants voices to stop

## 2021-01-02 NOTE — ED Notes (Signed)
Pt witness leaving by check in staff

## 2021-01-02 NOTE — ED Triage Notes (Signed)
Patient comes into ED every night for same medication refill. Upon triaging patient he is sitting in lobby eating a meal.

## 2021-01-03 ENCOUNTER — Other Ambulatory Visit: Payer: Self-pay

## 2021-01-03 ENCOUNTER — Encounter (HOSPITAL_COMMUNITY): Payer: Self-pay | Admitting: Emergency Medicine

## 2021-01-03 DIAGNOSIS — Z5321 Procedure and treatment not carried out due to patient leaving prior to being seen by health care provider: Secondary | ICD-10-CM | POA: Insufficient documentation

## 2021-01-03 DIAGNOSIS — R443 Hallucinations, unspecified: Secondary | ICD-10-CM | POA: Insufficient documentation

## 2021-01-03 NOTE — ED Notes (Signed)
Pt remarked that his chips and queso was excellent.

## 2021-01-03 NOTE — ED Triage Notes (Signed)
Pt reports that he is hearing voices and would like to go to [facility]. He denies SI/HI. No distress.

## 2021-01-03 NOTE — ED Notes (Signed)
Pt stated he was leaving. 

## 2021-01-03 NOTE — ED Triage Notes (Signed)
Patient would like to finish his chips and queso prior to triage.

## 2021-01-04 ENCOUNTER — Emergency Department (HOSPITAL_COMMUNITY)
Admission: EM | Admit: 2021-01-04 | Discharge: 2021-01-04 | Disposition: A | Discharge status: Home / Self Care | Payer: Self-pay | Attending: Emergency Medicine | Admitting: Emergency Medicine

## 2021-01-05 ENCOUNTER — Other Ambulatory Visit: Payer: Self-pay

## 2021-01-05 ENCOUNTER — Emergency Department (HOSPITAL_COMMUNITY)
Admission: EM | Admit: 2021-01-05 | Discharge: 2021-01-05 | Disposition: A | Discharge status: Home / Self Care | Payer: Self-pay | Attending: Emergency Medicine | Admitting: Emergency Medicine

## 2021-01-05 DIAGNOSIS — Z046 Encounter for general psychiatric examination, requested by authority: Secondary | ICD-10-CM | POA: Insufficient documentation

## 2021-01-05 DIAGNOSIS — Z5321 Procedure and treatment not carried out due to patient leaving prior to being seen by health care provider: Secondary | ICD-10-CM | POA: Insufficient documentation

## 2021-01-05 NOTE — ED Notes (Signed)
Pt not answering for vitals.  

## 2021-01-05 NOTE — ED Triage Notes (Signed)
Pt c/o hearing voices in his head, states that he wants to be seen/sent to [facility]; Denies SI/HI. NAD noted, calm, cooperative in triage

## 2021-01-05 NOTE — ED Notes (Signed)
Pt left stating he had to go to work!

## 2021-01-05 NOTE — ED Notes (Signed)
Pt refused VS reassessment. 

## 2021-01-06 ENCOUNTER — Encounter (HOSPITAL_COMMUNITY): Payer: Self-pay

## 2021-01-06 ENCOUNTER — Emergency Department (HOSPITAL_COMMUNITY)
Admission: EM | Admit: 2021-01-06 | Discharge: 2021-01-06 | Disposition: A | Discharge status: Home / Self Care | Payer: Self-pay | Attending: Emergency Medicine | Admitting: Emergency Medicine

## 2021-01-06 DIAGNOSIS — Z8616 Personal history of COVID-19: Secondary | ICD-10-CM | POA: Insufficient documentation

## 2021-01-06 DIAGNOSIS — F17210 Nicotine dependence, cigarettes, uncomplicated: Secondary | ICD-10-CM | POA: Insufficient documentation

## 2021-01-06 DIAGNOSIS — Z760 Encounter for issue of repeat prescription: Secondary | ICD-10-CM | POA: Insufficient documentation

## 2021-01-06 DIAGNOSIS — Z79899 Other long term (current) drug therapy: Secondary | ICD-10-CM

## 2021-01-06 MED ORDER — RISPERIDONE 1 MG PO TABS
1.0000 mg | ORAL_TABLET | Freq: Once | ORAL | Status: AC
  Administered 2021-01-06: 1 mg via ORAL
  Filled 2021-01-06: qty 1

## 2021-01-06 NOTE — ED Provider Notes (Signed)
[name] EMERGENCY DEPARTMENT Provider Note   CSN: [id number] Arrival date & time: 01/06/21  0059     History Chief Complaint  Patient presents with  . Medication Refill    Cole Barrett is a 35 y.o. male presents to the Emergency Department complaining of the need for his night dose of Risperdal.  Pt is without other complaint.  Denies aggravating or alleviating factors. Reports he has an Rx, but forgot it.  Denies SI/HI/AVH.    The history is provided by the patient and medical records. No language interpreter was used.       Past Medical History:  Diagnosis Date  . COVID-19   . Homelessness   . Hypercholesterolemia   . Mental disorder   . Paranoid behavior (HCC)   . PE (pulmonary embolism)   . Schizophrenia [facility])     Patient Active Problem List   Diagnosis Date Noted  . Acute psychosis (HCC) 08/31/2019  . Schizophrenia (HCC) 08/31/2019  . Schizoaffective disorder, bipolar type (HCC) 01/19/2019  . Alcohol abuse   . Auditory hallucinations   . Alcohol abuse with alcohol-induced mood disorder (HCC) 09/08/2016  . Homelessness 09/03/2016  . Person feigning illness 09/03/2016  . 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 Cannabis use disorder, mild, abuse 08/29/2016  . Pulmonary emboli (HCC) 07/13/2016  . Adjustment disorder with emotional disturbance 03/12/2014  . 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 Acute left flank pain 01/02/2014  . Pulmonary embolism, bilateral (HCC) 01/02/2014    History reviewed. No pertinent surgical history.     Family History  Problem Relation Age of Onset  . Hypertension Mother     Social History   Tobacco Use  . Smoking status: Current Every Day Smoker    Packs/day: 0.50    Types: Cigarettes  . Smokeless tobacco: Never Used  Vaping Use  . Vaping Use: Never used  Substance Use Topics  . Alcohol use: Yes    Comment: BAC not available  . Drug use: Yes    Types: Marijuana     Comment: Last used: 2 months ago UDS NA    Home Medications Prior to Admission medications   Medication Sig Start Date End Date Taking? Authorizing Provider  RISPERDAL 1 MG tablet Take 1 tablet (1 mg total) by mouth at bedtime. 12/27/20   Gailen Shelter, PA  amantadine (SYMMETREL) 100 MG capsule Take 1 capsule (100 mg total) by mouth 2 (two) times daily. Patient not taking: Reported on 11/04/2019 09/01/19 11/17/19  [name], NP    Allergies    Haldol [haloperidol]  Review of Systems   Review of Systems  Constitutional: Negative for fever.  Gastrointestinal: Negative for abdominal pain.  Neurological: Negative for headaches.  Psychiatric/Behavioral: Negative for hallucinations. The patient is not nervous/anxious.     Physical Exam Updated Vital Signs BP 122/74 (BP Location: Right Arm)   Pulse 78   Temp 98.7 F (37.1 C) (Oral)   Resp 16   SpO2 97%   Physical Exam Vitals and nursing note reviewed.  Constitutional:      General: He is not in acute distress.    Appearance: He is well-developed and well-nourished.  HENT:     Head: Normocephalic.  Eyes:     General: No scleral icterus.    Conjunctiva/sclera: Conjunctivae normal.  Cardiovascular:     Rate and Rhythm: Normal rate.     Pulses: Intact distal pulses.  Pulmonary:     Effort: Pulmonary  effort is normal.  Musculoskeletal:        General: Normal range of motion.     Cervical back: Normal range of motion.  Skin:    General: Skin is warm and dry.  Neurological:     General: No focal deficit present.     Mental Status: He is alert.  Psychiatric:        Mood and Affect: Mood normal.     ED Results / Procedures / Treatments    Procedures Procedures   Medications Ordered in ED Medications  risperiDONE (RISPERDAL) tablet 1 mg (has no administration in time range)    ED Course  I have reviewed the triage vital signs and the nursing notes.  Pertinent labs & imaging results that were available  during my care of the patient were reviewed by me and considered in my medical decision making (see chart for details).    MDM Rules/Calculators/A&P                           Pt presents with request for his night medications.  Denies SI/HI/AVH.  No other complaints.  Well appearing. Denies need for refill.  Meds given.    BP 122/74 (BP Location: Right Arm)   Pulse 78   Temp 98.7 F (37.1 C) (Oral)   Resp 16   SpO2 97%    Final Clinical Impression(s) / ED Diagnoses Final diagnoses:  Medication management    Rx / DC Orders ED Discharge Orders    None       Aniston Christman, Boyd Kerbs 01/06/21 0408    [name], MD 01/06/21 [id number]

## 2021-01-06 NOTE — Discharge Instructions (Addendum)
1. Medications: usual home medications 2. Treatment: rest, drink plenty of fluids, 3. Follow Up: Please followup with your primary doctor in 2 days for discussion of your diagnoses and further evaluation after today's visit; if you do not have a primary care doctor use the resource guide provided to find one; Please return to the ER for new or worsening symptoms

## 2021-01-06 NOTE — ED Triage Notes (Signed)
Pt here for his night dose of Risperdal

## 2021-01-08 ENCOUNTER — Emergency Department (HOSPITAL_COMMUNITY)
Admission: EM | Admit: 2021-01-08 | Discharge: 2021-01-08 | Disposition: A | Discharge status: Home / Self Care | Payer: Self-pay | Attending: Emergency Medicine | Admitting: Emergency Medicine

## 2021-01-08 ENCOUNTER — Encounter (HOSPITAL_COMMUNITY): Payer: Self-pay | Admitting: Emergency Medicine

## 2021-01-08 ENCOUNTER — Other Ambulatory Visit: Payer: Self-pay

## 2021-01-08 DIAGNOSIS — Z8616 Personal history of COVID-19: Secondary | ICD-10-CM | POA: Insufficient documentation

## 2021-01-08 DIAGNOSIS — F17210 Nicotine dependence, cigarettes, uncomplicated: Secondary | ICD-10-CM | POA: Insufficient documentation

## 2021-01-08 DIAGNOSIS — Z79899 Other long term (current) drug therapy: Secondary | ICD-10-CM

## 2021-01-08 DIAGNOSIS — R443 Hallucinations, unspecified: Secondary | ICD-10-CM | POA: Insufficient documentation

## 2021-01-08 DIAGNOSIS — Z760 Encounter for issue of repeat prescription: Secondary | ICD-10-CM | POA: Insufficient documentation

## 2021-01-08 DIAGNOSIS — F209 Schizophrenia, unspecified: Secondary | ICD-10-CM | POA: Insufficient documentation

## 2021-01-08 MED ORDER — RISPERIDONE 1 MG PO TABS
1.0000 mg | ORAL_TABLET | Freq: Once | ORAL | Status: AC
  Administered 2021-01-08: 1 mg via ORAL
  Filled 2021-01-08: qty 1

## 2021-01-08 NOTE — ED Triage Notes (Signed)
Pt requesting a dose of Risperdal. No distress.

## 2021-01-08 NOTE — Discharge Instructions (Addendum)
1. Medications: usual home medications 2. Treatment: rest, drink plenty of fluids,  3. Follow Up: Please followup with your primary doctor in 1-2 days for discussion of your diagnoses and further evaluation after today's visit; if you do not have a primary care doctor use the resource guide provided to find one; Please return to the ER for new or worsening symptoms  

## 2021-01-08 NOTE — ED Provider Notes (Signed)
[facility]-EMERGENCY DEPT Provider Note   CSN: [id number] Arrival date & time: 01/08/21  0012     History Chief Complaint  Patient presents with  . Medication Refill    Cole Barrett is a 35 y.o. male presents to the Emergency Department complaining of need for his night dose of Risperdal.  Patient is without additional complaints.  Denies aggravating or alleviating factors.  Reports he does not currently have access to his prescription.  Denies SI/HI/AVH.  The history is provided by the patient and medical records. No language interpreter was used.       Past Medical History:  Diagnosis Date  . COVID-19   . Homelessness   . Hypercholesterolemia   . Mental disorder   . Paranoid behavior (HCC)   . PE (pulmonary embolism)   . Schizophrenia [facility])     Patient Active Problem List   Diagnosis Date Noted  . Acute psychosis (HCC) 08/31/2019  . Schizophrenia (HCC) 08/31/2019  . Schizoaffective disorder, bipolar type (HCC) 01/19/2019  . Alcohol abuse   . Auditory hallucinations   . Alcohol abuse with alcohol-induced mood disorder (HCC) 09/08/2016  . Homelessness 09/03/2016  . Person feigning illness 09/03/2016  . 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 Cannabis use disorder, mild, abuse 08/29/2016  . Pulmonary emboli (HCC) 07/13/2016  . Adjustment disorder with emotional disturbance 03/12/2014  . 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 Acute left flank pain 01/02/2014  . Pulmonary embolism, bilateral (HCC) 01/02/2014    History reviewed. No pertinent surgical history.     Family History  Problem Relation Age of Onset  . Hypertension Mother     Social History   Tobacco Use  . Smoking status: Current Every Day Smoker    Packs/day: 0.50    Types: Cigarettes  . Smokeless tobacco: Never Used  Vaping Use  . Vaping Use: Never used  Substance Use Topics  . Alcohol use: Yes    Comment: BAC not available  . Drug  use: Yes    Types: Marijuana    Comment: Last used: 2 months ago UDS NA    Home Medications Prior to Admission medications   Medication Sig Start Date End Date Taking? Authorizing Provider  RISPERDAL 1 MG tablet Take 1 tablet (1 mg total) by mouth at bedtime. 12/27/20   Gailen Shelter, PA  amantadine (SYMMETREL) 100 MG capsule Take 1 capsule (100 mg total) by mouth 2 (two) times daily. Patient not taking: Reported on 11/04/2019 09/01/19 11/17/19  [name], NP    Allergies    Haldol [haloperidol]  Review of Systems   Review of Systems  Constitutional: Negative for fever.  Neurological: Negative for headaches.  Psychiatric/Behavioral: Negative for hallucinations and suicidal ideas. The patient is not nervous/anxious.     Physical Exam Updated Vital Signs BP (!) 156/92 (BP Location: Left Arm)   Pulse 85   Temp 97.7 F (36.5 C) (Oral)   Resp 18   SpO2 98%   Physical Exam Vitals and nursing note reviewed.  Constitutional:      General: He is not in acute distress.    Appearance: He is well-developed and well-nourished.  HENT:     Head: Normocephalic.  Eyes:     General: No scleral icterus.    Conjunctiva/sclera: Conjunctivae normal.  Cardiovascular:     Rate and Rhythm: Normal rate.     Pulses: Intact distal pulses.  Pulmonary:     Effort: Pulmonary effort is normal.  Breath sounds: Normal breath sounds.  Abdominal:     General: There is no distension.  Musculoskeletal:        General: Normal range of motion.     Cervical back: Normal range of motion.  Skin:    General: Skin is warm and dry.  Neurological:     Mental Status: He is alert.  Psychiatric:        Mood and Affect: Mood normal.     ED Results / Procedures / Treatments    Procedures Procedures   Medications Ordered in ED Medications  risperiDONE (RISPERDAL) tablet 1 mg (has no administration in time range)    ED Course  I have reviewed the triage vital signs and the nursing  notes.  Pertinent labs & imaging results that were available during my care of the patient were reviewed by me and considered in my medical decision making (see chart for details).    MDM Rules/Calculators/A&P                          Patient well-known to the emergency department presents today for administration of his nightly Risperdal dose.  Denies associated symptoms, suicidal ideation, homicidal ideation, audiovisual hallucinations.  Well-appearing.  Patient given his medication and will be discharged.  Final Clinical Impression(s) / ED Diagnoses Final diagnoses:  Medication management    Rx / DC Orders ED Discharge Orders    None       Dorotha Hirschi, Boyd Kerbs 01/08/21 0111    Molpus, Jonny Ruiz, MD 01/08/21 [phone]

## 2021-01-09 ENCOUNTER — Emergency Department (HOSPITAL_COMMUNITY)
Admission: EM | Admit: 2021-01-09 | Discharge: 2021-01-09 | Disposition: A | Discharge status: Home / Self Care | Payer: Self-pay | Attending: Emergency Medicine | Admitting: Emergency Medicine

## 2021-01-09 ENCOUNTER — Encounter (HOSPITAL_COMMUNITY): Payer: Self-pay | Admitting: Emergency Medicine

## 2021-01-09 DIAGNOSIS — R443 Hallucinations, unspecified: Secondary | ICD-10-CM

## 2021-01-09 MED ORDER — RISPERDAL 1 MG PO TABS
1.0000 mg | ORAL_TABLET | Freq: Every day | ORAL | 0 refills | Status: DC
Start: 2021-01-09 — End: 2021-02-06

## 2021-01-09 MED ORDER — RISPERIDONE 1 MG PO TABS
1.0000 mg | ORAL_TABLET | Freq: Once | ORAL | Status: AC
  Administered 2021-01-09: 1 mg via ORAL
  Filled 2021-01-09: qty 1

## 2021-01-09 NOTE — ED Provider Notes (Signed)
Emergency Department Provider Note   I have reviewed the triage vital signs and the nursing notes.   HISTORY  Chief Complaint Hallucinations and Medication Refill   HPI Cole Barrett is a 35 y.o. male with past medical history reviewed below including schizophrenia presents to the emergency department requesting his night dose of medication.  He tells me that he does not have the ability to get this medication as an outpatient.  He states that he does hear voices but that is unusual for him.  He denies any command hallucinations.  He denies any thoughts of suicidal or homicidal ideation.  In review of the chart the patient was last seen by psychiatry on 1/29 and found to not meet inpatient criteria at that time.  He was referred to [facility] for outpatient treatment but has not follwed with them. He is currently homeless. Denies any EtOH or illicit drug use.    Past Medical History:  Diagnosis Date  . COVID-19   . Homelessness   . Hypercholesterolemia   . Mental disorder   . Paranoid behavior (HCC)   . PE (pulmonary embolism)   . Schizophrenia [facility])     Patient Active Problem List   Diagnosis Date Noted  . Acute psychosis (HCC) 08/31/2019  . Schizophrenia (HCC) 08/31/2019  . Schizoaffective disorder, bipolar type (HCC) 01/19/2019  . Alcohol abuse   . Auditory hallucinations   . Alcohol abuse with alcohol-induced mood disorder (HCC) 09/08/2016  . Homelessness 09/03/2016  . Person feigning illness 09/03/2016  . 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 Cannabis use disorder, mild, abuse 08/29/2016  . Pulmonary emboli (HCC) 07/13/2016  . Adjustment disorder with emotional disturbance 03/12/2014  . 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 Acute left flank pain 01/02/2014  . Pulmonary embolism, bilateral (HCC) 01/02/2014    History reviewed. No pertinent surgical history.  Allergies Haldol [haloperidol]  Family History  Problem Relation Age of  Onset  . Hypertension Mother     Social History Social History   Tobacco Use  . Smoking status: Current Every Day Smoker    Packs/day: 0.50    Types: Cigarettes  . Smokeless tobacco: Never Used  Vaping Use  . Vaping Use: Never used  Substance Use Topics  . Alcohol use: Yes    Comment: BAC not available  . Drug use: Yes    Types: Marijuana    Comment: Last used: 2 months ago UDS NA    Review of Systems  Constitutional: No fever/chills Eyes: No visual changes. ENT: No sore throat. Cardiovascular: Denies chest pain. Respiratory: Denies shortness of breath. Gastrointestinal: No abdominal pain.  No nausea, no vomiting.  No diarrhea.  No constipation. Genitourinary: Negative for dysuria. Musculoskeletal: Negative for back pain. Skin: Negative for rash. Neurological: Negative for headaches, focal weakness or numbness. Psychiatric: Denies SI/HI. Positive AH at baseline.   10-point ROS otherwise negative.  ____________________________________________   PHYSICAL EXAM:  VITAL SIGNS: ED Triage Vitals  Enc Vitals Group     BP 01/09/21 0017 (!) 136/96     Pulse Rate 01/09/21 0017 (!) 103     Resp 01/09/21 0017 20     Temp 01/09/21 0017 98.2 F (36.8 C)     Temp Source 01/09/21 0017 Oral     SpO2 01/09/21 0017 98 %   Constitutional: Alert and oriented. Well appearing and in no acute distress. Eyes: Conjunctivae are normal.  Head: Atraumatic. Nose: No congestion/rhinnorhea. Mouth/Throat: Mucous membranes are moist.   Neck:  No stridor.   Cardiovascular: Good peripheral circulation. Respiratory: Normal respiratory effort. Gastrointestinal: No distention.  Musculoskeletal: No gross deformities of extremities. Neurologic:  Normal speech and language.  Skin:  Skin is warm, dry and intact. No rash noted.  ____________________________________________   PROCEDURES  Procedure(s) performed:   Procedures  None   ____________________________________________   INITIAL IMPRESSION / ASSESSMENT AND PLAN / ED COURSE  Pertinent labs & imaging results that were available during my care of the patient were reviewed by me and considered in my medical decision making (see chart for details).   Patient presents to the emergency department requesting his nighttime dose of medication.  He has been evaluated by psychiatry within the past 2 weeks and did not meet inpatient criteria at that time. Patient denies any SI/HI.  He describes auditory hallucinations but this appears to be relatively baseline for him.  He is able to engage in a reasonable conversation regarding his mental health.  I have ordered his nighttime dose of Risperdal but will again provide list of various psychiatry resources as we discussed that coming to the emergency department for his nightly doses of medication is not sustainable.   Will provide Rx and list of psychiatry resources.  ____________________________________________  FINAL CLINICAL IMPRESSION(S) / ED DIAGNOSES  Final diagnoses:  Hallucinations     MEDICATIONS GIVEN DURING THIS VISIT:  Medications  risperiDONE (RISPERDAL) tablet 1 mg (has no administration in time range)    Note:  This document was prepared using Dragon voice recognition software and may include unintentional dictation errors.  Alona Bene, MD, [facility] Emergency Medicine    Melva Faux, Arlyss Repress, MD 01/09/21 [phone]

## 2021-01-09 NOTE — ED Triage Notes (Signed)
Patient hearing voices requesting dose of Risperdal. No SI/HI.

## 2021-01-09 NOTE — Discharge Instructions (Signed)
Substance Abuse Treatment Programs ° °Intensive Outpatient Programs °[facility]     °[address]      °[place], [place]                   °[phone]      ° °[facility] °[address] °[place], [place] °[phone] ° °[facility]     °(Inpatient and outpatient)     °[address]           °[phone]   ° °[facility] °[phone] (Suboxone and Methadone) ° °[address]      °[place], [place] [address]      °[phone]      ° °[address] °[place], [place] °852-3033 ° °[facility] (Outpatient/Inpatient, [facility])    °(insurance only) [phone]      °       °[facility] (Groups & Residential) °[place], [place] °[phone] ° °   °[facility]     °[address]     °[place], [place]      °[phone]      ° °[facility] (for caregivers and family) °[address] °[place], [place] °336-299-4655 ° ° ° ° ° °Residential Treatment Programs °[facility]      °[address], [place], [place] [address]  °[phone]      ° °[facility] °[address] [place], [place] [address] °[phone] ° °[facility]        °336-248-8914      ° °[facility] °1-800-659-3381 ° °[facility]             °[address]                                         °[place], [place]                                                °877-615-2722 or 336-784-9470                              ° °[facility] [place] °[address] °[place], [address] °[phone] ° °[facility]    °[address]      °[place], [place]     °[phone]      ° °[facility] °[address] °, [place] °336-285-9073 ° °[facility]   °[address]     °[place], [place] [address]     °[phone]      °Admissions: 8am-3pm M-F ° °[facility] °[address] °[place],  [place] °336-227-7417 ° °[facility]: Residential Program (90 Days)   °[place], [place]      °336-725-8389 or 800-758-6077    ° °[facility] °[place], [place] °(Walk in Hours over the weekend or by referral) ° °[facility] °[address], [place],  [address] °([phone] ° °Crisis Mobile: [facility]:  1-877-626-1772 (for crisis response 24 hours a day) °[facility] Hotline:      1-800-256-2452 °Outpatient Psychiatry and Counseling ° °[facility]: Mobile Crisis   Management 24 hours:  1-877-626-1772 ° °[facility] sliding scale fee and walk in schedule: M-F 8am-12pm/1pm-3pm °[address]  °[place], [place] [address] °[phone] ° °[facility] °[address] °[place], [place] [address] °336-703-9650 ° °[facility] ([name])- new patient walk-in appointments available Monday - Friday 8am -3pm.          °[address] °[place], [place] [address] °336-676-6840 or crisis line- 336-676-6905 ° °[facility] Intensive Outpatient Therapy Program °[address] °[place], [place] [address] °336-832-9804 ° °[name]                  °Crisis Services      °[phone]      °[address]     °[place], [place] [address]                ° °[facility]   °[facility] °[phone] °[address] °[place], [place] [address] ° ° °[facility]          °[address],  °[place], [place] [address]       °[phone] ° °[facility] °[address] °[place], [place] [address] °336-292-1510 ° °[facility]    °[address]    °[place], [place] [address]     °[phone]      ° °Parish McKinney, MD     °[address]     °[place] [address]     °336-282-1251     °  °[facility] °[address] °[place] [address] ° °[facility]     °[address]     °[place], [place]      °336-542-2076      ° °[facility] °Shamsher Ahluwalia, MD °[address] °[place], [place] [address] °336-420-9558 ° °[facility]     °[address]     °[place], [place] [address]     °336-274-1237      ° °[facility] °[address] °[place], [place] [address] °336-854-4450 °Resources for Temporary Residential Assistance/Crisis Centers ° °DAY CENTERS °[facility] °M-F 8am-3pm   °[address] [place], [place] [address]   336-332-0824 °Services include: laundry, barbering, support groups, case management, phone  & computer access, showers, AA/NA mtgs, mental health/substance abuse nurse, job skills class, disability information, VA assistance, spiritual classes, etc.  ° °HOMELESS SHELTERS ° °[facility]     °[facility]   °[address], [place]     °[phone]       °       °[facility] (women and children)       °[name] °[place], [place] [address] °336-275-0820 °[email or web address] for application and process °Application Required ° °[facility]   °[address]    °[place] [address]     °[phone]       °             °[facility] °[address] °[place], [place] [address] °336.273.5572 °336-235-0363(schedule application appt.) °Application Required ° °[facility] (women only)    °[address]     °[place],  [address]     °336-884-1039      °  Intake starts 6pm daily °Need valid ID, SSC, & Police report °[facility] [place] °[address] °[place], [place] °336-881-5420 °Application Required ° °[facility] (men only)     °[address]      °[place], [place]     °[phone]      ° °[facility] °(Pregnant women only) °[address] °[place], [place] °336-275-0206 ° °[facility]      °[address]      °[place], [place] [address]     °[phone]      °       °[facility] °[address] °[place], [place] °336-723-1848 °90 day commitment/SA/Application process ° °[facility](men only)     °[address]     °[place], [place]     °[phone]       °Check-in at 7pm     °       °[facility] °[address] °[place], [place] [address] °336-248-6684 °Men/Women/Women and Children must be there by 7 pm ° °[facility] °[place], [place] °336-722-8721                ° °

## 2021-01-10 ENCOUNTER — Encounter (HOSPITAL_COMMUNITY): Payer: Self-pay | Admitting: Emergency Medicine

## 2021-01-10 ENCOUNTER — Emergency Department (HOSPITAL_COMMUNITY)
Admission: EM | Admit: 2021-01-10 | Discharge: 2021-01-10 | Disposition: A | Discharge status: Home / Self Care | Payer: Self-pay | Attending: Physician Assistant | Admitting: Physician Assistant

## 2021-01-10 ENCOUNTER — Other Ambulatory Visit: Payer: Self-pay

## 2021-01-10 DIAGNOSIS — Z760 Encounter for issue of repeat prescription: Secondary | ICD-10-CM | POA: Insufficient documentation

## 2021-01-10 DIAGNOSIS — F17210 Nicotine dependence, cigarettes, uncomplicated: Secondary | ICD-10-CM | POA: Insufficient documentation

## 2021-01-10 MED ORDER — RISPERIDONE 1 MG PO TABS
1.0000 mg | ORAL_TABLET | Freq: Every day | ORAL | Status: DC
  Administered 2021-01-10: 1 mg via ORAL
  Filled 2021-01-10: qty 1

## 2021-01-10 MED ORDER — RISPERIDONE 1 MG PO TABS: 1.0000 mg | ORAL_TABLET | Freq: Every day | ORAL | Status: DC

## 2021-01-10 NOTE — ED Triage Notes (Signed)
Patient requesting a dose of his Risperdal.  No SI/HI.

## 2021-01-10 NOTE — ED Notes (Signed)
Pt states that he forgot to take his medication, lives across town, and doesn't have a way back till the morning. States he doesn't want anything to happen by missing his medication. Reports he has medication at home.

## 2021-01-10 NOTE — ED Provider Notes (Signed)
[name] EMERGENCY DEPARTMENT Provider Note   CSN: [id number] Arrival date & time: 01/10/21  0124     History Chief Complaint  Patient presents with  . Medication Refill    Cole Barrett is a 35 y.o. male with past medical history significant for homelessness, schizophrenia who presents for evaluation of requesting his Risperdal.  Patient states he has his Risperdal medication on the "other side of town" however he was over here yesterday evening and is requesting his dose.  States he has hallucinations however they are at baseline.  Denies any command hallucinations.  Denies any SI, HI.  He has no complaints.  Per chart review patient has been here in the last few nights for his medications.  Was seen by psychiatry 2 weeks ago did not feel that inpatient criteria due to chronic hallucinations.  Patient does pull out a card from [facility] which is given to him yesterday evening while he was here.  He denies any EtOH use, drug use.  History obtained from patient and past medical records. No interpretor was used.   HPI     Past Medical History:  Diagnosis Date  . COVID-19   . Homelessness   . Hypercholesterolemia   . Mental disorder   . Paranoid behavior (HCC)   . PE (pulmonary embolism)   . Schizophrenia [facility])     Patient Active Problem List   Diagnosis Date Noted  . Acute psychosis (HCC) 08/31/2019  . Schizophrenia (HCC) 08/31/2019  . Schizoaffective disorder, bipolar type (HCC) 01/19/2019  . Alcohol abuse   . Auditory hallucinations   . Alcohol abuse with alcohol-induced mood disorder (HCC) 09/08/2016  . Homelessness 09/03/2016  . Person feigning illness 09/03/2016  . 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 Cannabis use disorder, mild, abuse 08/29/2016  . Pulmonary emboli (HCC) 07/13/2016  . Adjustment disorder with emotional disturbance 03/12/2014  . 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 Acute left flank pain  01/02/2014  . Pulmonary embolism, bilateral (HCC) 01/02/2014    History reviewed. No pertinent surgical history.     Family History  Problem Relation Age of Onset  . Hypertension Mother     Social History   Tobacco Use  . Smoking status: Current Every Day Smoker    Packs/day: 0.50    Types: Cigarettes  . Smokeless tobacco: Never Used  Vaping Use  . Vaping Use: Never used  Substance Use Topics  . Alcohol use: Yes    Comment: BAC not available  . Drug use: Yes    Types: Marijuana    Comment: Last used: 2 months ago UDS NA    Home Medications Prior to Admission medications   Medication Sig Start Date End Date Taking? Authorizing Provider  RISPERDAL 1 MG tablet Take 1 tablet (1 mg total) by mouth at bedtime. 01/09/21   Long, Arlyss Repress, MD  amantadine (SYMMETREL) 100 MG capsule Take 1 capsule (100 mg total) by mouth 2 (two) times daily. Patient not taking: Reported on 11/04/2019 09/01/19 11/17/19  [name], NP    Allergies    Haldol [haloperidol]  Review of Systems   Review of Systems  Constitutional: Negative.   HENT: Negative.   Respiratory: Negative.   Cardiovascular: Negative.   Gastrointestinal: Negative.   Genitourinary: Negative.   Musculoskeletal: Negative.   Skin: Negative.   Neurological: Negative.   Psychiatric/Behavioral: Positive for hallucinations (Chronic, no command hallucinations ).  All other systems reviewed and are negative.   Physical  Exam Updated Vital Signs BP 117/62 (BP Location: Right Arm)   Pulse 75   Temp 98.5 F (36.9 C) (Oral)   Resp 17   SpO2 98%   Physical Exam Vitals and nursing note reviewed.  Constitutional:      General: He is not in acute distress.    Appearance: He is well-developed and well-nourished. He is not ill-appearing, toxic-appearing or diaphoretic.  HENT:     Head: Normocephalic and atraumatic.     Nose: Nose normal.     Mouth/Throat:     Mouth: Mucous membranes are moist.  Eyes:     Pupils:  Pupils are equal, round, and reactive to light.  Cardiovascular:     Rate and Rhythm: Normal rate and regular rhythm.     Pulses: Normal pulses.     Heart sounds: Normal heart sounds.  Pulmonary:     Effort: Pulmonary effort is normal. No respiratory distress.     Breath sounds: Normal breath sounds.  Abdominal:     General: Bowel sounds are normal. There is no distension.     Palpations: Abdomen is soft.  Musculoskeletal:        General: Normal range of motion.     Cervical back: Normal range of motion and neck supple.  Skin:    General: Skin is warm and dry.     Capillary Refill: Capillary refill takes less than 2 seconds.  Neurological:     General: No focal deficit present.     Mental Status: He is alert and oriented to person, place, and time.  Psychiatric:        Mood and Affect: Mood and affect normal.     Comments: Denies SI, HI. Chronic auditory hallucinations, no command hallucinations     ED Results / Procedures / Treatments   Labs (all labs ordered are listed, but only abnormal results are displayed) Labs Reviewed - No data to display  EKG None  Radiology No results found.  Procedures Procedures   Medications Ordered in ED Medications  risperiDONE (RISPERDAL) tablet 1 mg (has no administration in time range)   ED Course  I have reviewed the triage vital signs and the nursing notes.  Pertinent labs & imaging results that were available during my care of the patient were reviewed by me and considered in my medical decision making (see chart for details).  Patient well-known to the emergency department. History of schizophrenia.  Here for his Risperdal dose as he does not have his medications with him.  States he does have a prescription at home and according to patient was provided to him by social work.  He does take out a paper with [facility] information on this.  He denies any command hallucinations.  Denies SI, HI. Well appearing on exam. Dc home in stable  condition. Given resources for outpatient management.     MDM Rules/Calculators/A&P                           Final Clinical Impression(s) / ED Diagnoses Final diagnoses:  Medication refill    Rx / DC Orders ED Discharge Orders    None       Henderly, Britni A, PA-C 01/10/21 0701    Jacalyn Lefevre, MD 01/10/21 [phone]

## 2021-01-11 ENCOUNTER — Emergency Department (HOSPITAL_COMMUNITY)
Admission: EM | Admit: 2021-01-11 | Discharge: 2021-01-11 | Disposition: A | Discharge status: Home / Self Care | Payer: Self-pay | Attending: Emergency Medicine | Admitting: Emergency Medicine

## 2021-01-11 ENCOUNTER — Emergency Department (HOSPITAL_COMMUNITY)
Admission: EM | Admit: 2021-01-11 | Discharge: 2021-01-12 | Disposition: A | Discharge status: Other Acute Inpatient Hospital | Payer: Self-pay | Attending: Emergency Medicine | Admitting: Emergency Medicine

## 2021-01-11 ENCOUNTER — Other Ambulatory Visit: Payer: Self-pay

## 2021-01-11 ENCOUNTER — Encounter (HOSPITAL_COMMUNITY): Payer: Self-pay

## 2021-01-11 DIAGNOSIS — Z79899 Other long term (current) drug therapy: Secondary | ICD-10-CM | POA: Insufficient documentation

## 2021-01-11 DIAGNOSIS — R440 Auditory hallucinations: Secondary | ICD-10-CM | POA: Insufficient documentation

## 2021-01-11 DIAGNOSIS — Z5900 Homelessness unspecified: Secondary | ICD-10-CM | POA: Insufficient documentation

## 2021-01-11 DIAGNOSIS — Z20822 Contact with and (suspected) exposure to covid-19: Secondary | ICD-10-CM | POA: Insufficient documentation

## 2021-01-11 DIAGNOSIS — F17210 Nicotine dependence, cigarettes, uncomplicated: Secondary | ICD-10-CM | POA: Insufficient documentation

## 2021-01-11 DIAGNOSIS — Z760 Encounter for issue of repeat prescription: Secondary | ICD-10-CM | POA: Insufficient documentation

## 2021-01-11 MED ORDER — RISPERIDONE 1 MG PO TABS
1.0000 mg | ORAL_TABLET | Freq: Once | ORAL | Status: AC
  Administered 2021-01-11: 1 mg via ORAL
  Filled 2021-01-11 (×2): qty 1

## 2021-01-11 MED ORDER — RISPERIDONE 1 MG PO TABS
1.0000 mg | ORAL_TABLET | Freq: Once | ORAL | Status: AC
  Administered 2021-01-12: 1 mg via ORAL
  Filled 2021-01-11: qty 1

## 2021-01-11 NOTE — ED Triage Notes (Signed)
Patient arrived stating he wants to be seen by behavior health because he is having "some mental problems" Declines any SI/HI at this time just stating "things are just bad today"

## 2021-01-11 NOTE — ED Provider Notes (Signed)
[facility]-EMERGENCY DEPT Provider Note  CSN: [id number] Arrival date & time: 01/11/21 2254  Chief Complaint(s) Medical Clearance  HPI Cole Barrett is a 35 y.o. male    Mental Health Problem Presenting symptoms: hallucinations   Degree of incapacity (severity):  Moderate Onset quality:  Gradual Duration:  1 day Timing:  Constant Progression:  Unchanged Chronicity:  Recurrent Relieved by:  Nothing Worsened by:  Nothing Associated symptoms: anxiety   Associated symptoms: no abdominal pain, no anhedonia, no chest pain, no decreased need for sleep, not distractible, no euphoric mood, no fatigue, no feelings of worthlessness, no hypersomnia, no hyperventilation, no insomnia, no irritability and no school problems     Past Medical History Past Medical History:  Diagnosis Date  . COVID-19   . Homelessness   . Hypercholesterolemia   . Mental disorder   . Paranoid behavior (HCC)   . PE (pulmonary embolism)   . Schizophrenia [facility])    Patient Active Problem List   Diagnosis Date Noted  . Acute psychosis (HCC) 08/31/2019  . Schizophrenia (HCC) 08/31/2019  . Schizoaffective disorder, bipolar type (HCC) 01/19/2019  . Alcohol abuse   . Auditory hallucinations   . Alcohol abuse with alcohol-induced mood disorder (HCC) 09/08/2016  . Homelessness 09/03/2016  . Person feigning illness 09/03/2016  . 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 Cannabis use disorder, mild, abuse 08/29/2016  . Pulmonary emboli (HCC) 07/13/2016  . Adjustment disorder with emotional disturbance 03/12/2014  . 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 Acute left flank pain 01/02/2014  . Pulmonary embolism, bilateral (HCC) 01/02/2014   Home Medication(s) Prior to Admission medications   Medication Sig Start Date End Date Taking? Authorizing Provider  RISPERDAL 1 MG tablet Take 1 tablet (1 mg total) by mouth at bedtime. 01/09/21  Yes Long, Arlyss Repress, MD   amantadine (SYMMETREL) 100 MG capsule Take 1 capsule (100 mg total) by mouth 2 (two) times daily. Patient not taking: Reported on 11/04/2019 09/01/19 11/17/19  [name], NP                                                                                                                                    Past Surgical History History reviewed. No pertinent surgical history. Family History Family History  Problem Relation Age of Onset  . Hypertension Mother     Social History Social History   Tobacco Use  . Smoking status: Current Every Day Smoker    Packs/day: 0.50    Types: Cigarettes  . Smokeless tobacco: Never Used  Vaping Use  . Vaping Use: Never used  Substance Use Topics  . Alcohol use: Yes    Comment: BAC not available  . Drug use: Yes    Types: Marijuana    Comment: Last used: 2 months ago UDS NA   Allergies Haldol [haloperidol]  Review of Systems Review of Systems  Constitutional: Negative for fatigue  and irritability.  Cardiovascular: Negative for chest pain.  Gastrointestinal: Negative for abdominal pain.  Psychiatric/Behavioral: Positive for hallucinations. The patient is nervous/anxious. The patient does not have insomnia.    All other systems are reviewed and are negative for acute change except as noted in the HPI  Physical Exam Vital Signs  I have reviewed the triage vital signs BP (!) 162/75 (BP Location: Right Arm)   Pulse 75   Temp 98.6 F (37 C) (Oral)   Resp 16   SpO2 98%   Physical Exam Vitals reviewed.  Constitutional:      General: He is not in acute distress.    Appearance: He is well-developed and well-nourished. He is not diaphoretic.  HENT:     Head: Normocephalic and atraumatic.     Jaw: No trismus.     Right Ear: External ear normal.     Left Ear: External ear normal.     Nose: Nose normal.     Mouth/Throat:     Mouth: Mucous membranes are normal.  Eyes:     General: No scleral icterus.    Extraocular Movements: EOM  normal.     Conjunctiva/sclera: Conjunctivae normal.  Neck:     Trachea: Phonation normal.  Cardiovascular:     Rate and Rhythm: Normal rate and regular rhythm.  Pulmonary:     Effort: Pulmonary effort is normal. No respiratory distress.     Breath sounds: No stridor.  Abdominal:     General: There is no distension.  Musculoskeletal:        General: No edema. Normal range of motion.     Cervical back: Normal range of motion.  Neurological:     Mental Status: He is alert and oriented to person, place, and time.  Psychiatric:        Mood and Affect: Mood is anxious.        Behavior: Behavior normal.        Thought Content: Thought content does not include homicidal or suicidal ideation.     ED Results and Treatments Labs (all labs ordered are listed, but only abnormal results are displayed) Labs Reviewed  POC SARS CORONAVIRUS 2 AG -  ED                                                                                                                         EKG  EKG Interpretation  Date/Time:    Ventricular Rate:    PR Interval:    QRS Duration:   QT Interval:    QTC Calculation:   R Axis:     Text Interpretation:        Radiology No results found.  Pertinent labs & imaging results that were available during my care of the patient were reviewed by me and considered in my medical decision making (see chart for details).  Medications Ordered in ED Medications  risperiDONE (RISPERDAL) tablet 1 mg (1 mg Oral Given 01/12/21 0033)  Procedures Procedures  (including critical care time)  Medical Decision Making / ED Course I have reviewed the nursing notes for this encounter and the patient's prior records (if available in EHR or on provided paperwork).   Cole Barrett was evaluated in Emergency Department on 01/12/2021 for the  symptoms described in the history of present illness. He was evaluated in the context of the global COVID-19 pandemic, which necessitated consideration that the patient might be at risk for infection with the SARS-CoV-2 virus that causes COVID-19. Institutional protocols and algorithms that pertain to the evaluation of patients at risk for COVID-19 are in a state of rapid change based on information released by regulatory bodies including the CDC and federal and state organizations. These policies and algorithms were followed during the patient's care in the ED.  Well appearing male With known schizo. Usually seen daily for medication administration. Here today for command auditory hallucinations telling him to kill himself. No other complaints.  No need for labs.  covid negative. Given his PM risperidol. Spoke with [name] at [facility] who accepted patient for eval.      Final Clinical Impression(s) / ED Diagnoses Final diagnoses:  Severe auditory hallucinations      This chart was dictated using voice recognition software.  Despite best efforts to proofread,  errors can occur which can change the documentation meaning.   [name], MD 01/12/21 [phone]

## 2021-01-11 NOTE — ED Provider Notes (Signed)
[name] EMERGENCY DEPARTMENT Provider Note   CSN: [id number] Arrival date & time: 01/11/21  0126     History Chief Complaint  Patient presents with  . Medication Reaction    Cole Barrett is a 35 y.o. male.  The history is provided by the patient and medical records.    35 year old male with history of schizophrenia, paranoid behavior, homelessness, presenting to the ED requesting his Resporal.  He is well-known to this facility and this is a daily complaint.  He denies any current homicidal or suicidal ideation.  No hallucinations.  Past Medical History:  Diagnosis Date  . COVID-19   . Homelessness   . Hypercholesterolemia   . Mental disorder   . Paranoid behavior (HCC)   . PE (pulmonary embolism)   . Schizophrenia [facility])     Patient Active Problem List   Diagnosis Date Noted  . Acute psychosis (HCC) 08/31/2019  . Schizophrenia (HCC) 08/31/2019  . Schizoaffective disorder, bipolar type (HCC) 01/19/2019  . Alcohol abuse   . Auditory hallucinations   . Alcohol abuse with alcohol-induced mood disorder (HCC) 09/08/2016  . Homelessness 09/03/2016  . Person feigning illness 09/03/2016  . 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 Cannabis use disorder, mild, abuse 08/29/2016  . Pulmonary emboli (HCC) 07/13/2016  . Adjustment disorder with emotional disturbance 03/12/2014  . 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 Acute left flank pain 01/02/2014  . Pulmonary embolism, bilateral (HCC) 01/02/2014    No past surgical history on file.     Family History  Problem Relation Age of Onset  . Hypertension Mother     Social History   Tobacco Use  . Smoking status: Current Every Day Smoker    Packs/day: 0.50    Types: Cigarettes  . Smokeless tobacco: Never Used  Vaping Use  . Vaping Use: Never used  Substance Use Topics  . Alcohol use: Yes    Comment: BAC not available  . Drug use: Yes    Types: Marijuana     Comment: Last used: 2 months ago UDS NA    Home Medications Prior to Admission medications   Medication Sig Start Date End Date Taking? Authorizing Provider  RISPERDAL 1 MG tablet Take 1 tablet (1 mg total) by mouth at bedtime. 01/09/21   Long, Arlyss Repress, MD  amantadine (SYMMETREL) 100 MG capsule Take 1 capsule (100 mg total) by mouth 2 (two) times daily. Patient not taking: Reported on 11/04/2019 09/01/19 11/17/19  [name], NP    Allergies    Haldol [haloperidol]  Review of Systems   Review of Systems  Constitutional:       Needs meds  All other systems reviewed and are negative.   Physical Exam Updated Vital Signs BP (!) 147/69 (BP Location: Right Arm)   Pulse 75   Temp 97.8 F (36.6 C) (Oral)   Resp 16   SpO2 96%   Physical Exam Vitals and nursing note reviewed.  Constitutional:      Appearance: He is well-developed and well-nourished.     Comments: Appears at his baseline  HENT:     Head: Normocephalic and atraumatic.     Mouth/Throat:     Mouth: Oropharynx is clear and moist.  Eyes:     Extraocular Movements: EOM normal.     Conjunctiva/sclera: Conjunctivae normal.     Pupils: Pupils are equal, round, and reactive to light.  Cardiovascular:     Rate and Rhythm: Normal rate and  regular rhythm.     Heart sounds: Normal heart sounds.  Pulmonary:     Effort: Pulmonary effort is normal.     Breath sounds: Normal breath sounds.  Abdominal:     General: Bowel sounds are normal.     Palpations: Abdomen is soft.     Hernia: No hernia is present.  Musculoskeletal:        General: Normal range of motion.     Cervical back: Normal range of motion.  Skin:    General: Skin is warm and dry.  Neurological:     Mental Status: He is alert and oriented to person, place, and time.  Psychiatric:        Mood and Affect: Mood and affect normal.     ED Results / Procedures / Treatments   Labs (all labs ordered are listed, but only abnormal results are  displayed) Labs Reviewed - No data to display  EKG None  Radiology No results found.  Procedures Procedures   Medications Ordered in ED Medications  risperiDONE (RISPERDAL) tablet 1 mg (has no administration in time range)    ED Course  I have reviewed the triage vital signs and the nursing notes.  Pertinent labs & imaging results that were available during my care of the patient were reviewed by me and considered in my medical decision making (see chart for details).    MDM Rules/Calculators/A&P  35 year old male here requesting his Risperdal.  Well-known to this facility, seen here daily for similar.  Given his usual dose.  Denies suicidal or homicidal ideation.  Stable for discharge.  Final Clinical Impression(s) / ED Diagnoses Final diagnoses:  Medication refill    Rx / DC Orders ED Discharge Orders    None       Garlon Hatchet, PA-C 01/11/21 0332    [name], MD 01/11/21 [phone]

## 2021-01-11 NOTE — ED Triage Notes (Signed)
Pt present to ED needs dose of Risperdal. Pt denies SI/HI. Says he got "a little angry earlier."

## 2021-01-12 ENCOUNTER — Encounter (HOSPITAL_COMMUNITY): Payer: Self-pay | Admitting: Emergency Medicine

## 2021-01-12 ENCOUNTER — Ambulatory Visit (HOSPITAL_COMMUNITY)
Admission: EM | Admit: 2021-01-12 | Discharge: 2021-01-12 | Disposition: A | Discharge status: Home / Self Care | Payer: No Payment, Other | Attending: Psychiatry | Admitting: Psychiatry

## 2021-01-12 ENCOUNTER — Other Ambulatory Visit: Payer: Self-pay

## 2021-01-12 DIAGNOSIS — F17210 Nicotine dependence, cigarettes, uncomplicated: Secondary | ICD-10-CM | POA: Insufficient documentation

## 2021-01-12 DIAGNOSIS — F209 Schizophrenia, unspecified: Secondary | ICD-10-CM | POA: Insufficient documentation

## 2021-01-12 DIAGNOSIS — Z79899 Other long term (current) drug therapy: Secondary | ICD-10-CM | POA: Insufficient documentation

## 2021-01-12 DIAGNOSIS — Z20822 Contact with and (suspected) exposure to covid-19: Secondary | ICD-10-CM | POA: Diagnosis not present

## 2021-01-12 DIAGNOSIS — Z8616 Personal history of COVID-19: Secondary | ICD-10-CM | POA: Insufficient documentation

## 2021-01-12 LAB — RESP PANEL BY RT-PCR (FLU A&B, COVID) ARPGX2
Influenza A by PCR: NEGATIVE
Influenza B by PCR: NEGATIVE
SARS Coronavirus 2 by RT PCR: NEGATIVE

## 2021-01-12 LAB — POCT URINE DRUG SCREEN - MANUAL ENTRY (I-SCREEN)
POC Amphetamine UR: NOT DETECTED
POC Buprenorphine (BUP): NOT DETECTED
POC Cocaine UR: NOT DETECTED
POC Marijuana UR: NOT DETECTED
POC Methadone UR: NOT DETECTED
POC Methamphetamine UR: NOT DETECTED
POC Morphine: NOT DETECTED
POC Oxazepam (BZO): NOT DETECTED
POC Oxycodone UR: NOT DETECTED
POC Secobarbital (BAR): NOT DETECTED

## 2021-01-12 LAB — COMPREHENSIVE METABOLIC PANEL
ALT: 31 U/L (ref 0–44)
AST: 24 U/L (ref 15–41)
Albumin: 3.7 g/dL (ref 3.5–5.0)
Alkaline Phosphatase: 52 U/L (ref 38–126)
Anion gap: 12 (ref 5–15)
BUN: 10 mg/dL (ref 6–20)
CO2: 23 mmol/L (ref 22–32)
Calcium: 8.9 mg/dL (ref 8.9–10.3)
Chloride: 104 mmol/L (ref 98–111)
Creatinine, Ser: 1.06 mg/dL (ref 0.61–1.24)
GFR, Estimated: >60 mL/min (ref >60)
Glucose, Bld: 125 mg/dL — ABNORMAL HIGH (ref 70–99)
Potassium: 3.7 mmol/L (ref 3.5–5.1)
Sodium: 139 mmol/L (ref 135–145)
Total Bilirubin: 0.4 mg/dL (ref 0.3–1.2)
Total Protein: 6.8 g/dL (ref 6.5–8.1)

## 2021-01-12 LAB — CBC WITH DIFFERENTIAL/PLATELET
Abs Immature Granulocytes: 0.03 10*3/uL (ref 0.00–0.07)
Basophils Absolute: 0 10*3/uL (ref 0.0–0.1)
Basophils Relative: 1 %
Eosinophils Absolute: 0.3 10*3/uL (ref 0.0–0.5)
Eosinophils Relative: 5 %
HCT: 40 % (ref 39.0–52.0)
Hemoglobin: 12.6 g/dL — ABNORMAL LOW (ref 13.0–17.0)
Immature Granulocytes: 1 %
Lymphocytes Relative: 30 %
Lymphs Abs: 1.4 10*3/uL (ref 0.7–4.0)
MCH: 27.6 pg (ref 26.0–34.0)
MCHC: 31.5 g/dL (ref 30.0–36.0)
MCV: 87.7 fL (ref 80.0–100.0)
Monocytes Absolute: 0.5 10*3/uL (ref 0.1–1.0)
Monocytes Relative: 10 %
Neutro Abs: 2.6 10*3/uL (ref 1.7–7.7)
Neutrophils Relative %: 53 %
Platelets: 274 10*3/uL (ref 150–400)
RBC: 4.56 MIL/uL (ref 4.22–5.81)
RDW: 12.8 % (ref 11.5–15.5)
WBC: 4.8 10*3/uL (ref 4.0–10.5)
nRBC: 0 % (ref 0.0–0.2)

## 2021-01-12 LAB — POC SARS CORONAVIRUS 2 AG -  ED: SARS Coronavirus 2 Ag: NEGATIVE

## 2021-01-12 LAB — LIPID PANEL
Cholesterol: 207 mg/dL — ABNORMAL HIGH (ref 0–200)
HDL: 48 mg/dL (ref >40)
LDL Cholesterol: 106 mg/dL — ABNORMAL HIGH (ref 0–99)
Total CHOL/HDL Ratio: 4.3 RATIO
Triglycerides: 264 mg/dL — ABNORMAL HIGH (ref <150)
VLDL: 53 mg/dL — ABNORMAL HIGH (ref 0–40)

## 2021-01-12 LAB — HEMOGLOBIN A1C
Hgb A1c MFr Bld: 4.9 % (ref 4.8–5.6)
Mean Plasma Glucose: 94 mg/dL

## 2021-01-12 LAB — TSH: TSH: 3.237 u[IU]/mL (ref 0.350–4.500)

## 2021-01-12 MED ORDER — ALUM & MAG HYDROXIDE-SIMETH 200-200-20 MG/5ML PO SUSP: 30.0000 mL | ORAL | Status: DC | PRN

## 2021-01-12 MED ORDER — HYDROXYZINE HCL 25 MG PO TABS: 25.0000 mg | ORAL_TABLET | Freq: Three times a day (TID) | ORAL | Status: DC | PRN

## 2021-01-12 MED ORDER — MAGNESIUM HYDROXIDE 400 MG/5ML PO SUSP: 30.0000 mL | Freq: Every day | ORAL | Status: DC | PRN

## 2021-01-12 MED ORDER — RISPERIDONE 1 MG PO TABS
1.0000 mg | ORAL_TABLET | Freq: Every day | ORAL | Status: DC
  Filled 2021-01-12: qty 7

## 2021-01-12 MED ORDER — TRAZODONE HCL 50 MG PO TABS: 50.0000 mg | ORAL_TABLET | Freq: Every evening | ORAL | Status: DC | PRN

## 2021-01-12 MED ORDER — ACETAMINOPHEN 325 MG PO TABS: 650.0000 mg | ORAL_TABLET | Freq: Four times a day (QID) | ORAL | Status: DC | PRN

## 2021-01-12 NOTE — ED Notes (Signed)
Safe transport called for patient  

## 2021-01-12 NOTE — BH Assessment (Signed)
Comprehensive Clinical Assessment (CCA) Note  01/12/2021 Cole Barrett [id number]  Chief Complaint:  Chief Complaint  Patient presents with  . Hallucinations   Visit Diagnosis: F20.9, Schizophrenia   CCA Screening, Triage and Referral (STR) [name] is a  35 year old patient who was [facility] the [facility] via [facility] after being medically cleared from [facility]. Pt came to the [facility] because, "I know something bad is going on tonight." Pt shares his AH have been worse than usual. When pt was asked as to whether he has been taking his prescribed Risperdal 1mg  he states that he has; pt verified he has his prescription at home. When asked if pt needs a refill, he states he doesn't and that he comes to the ED when he needs his prescription filled. Pt was provided information re: his ability to utilize the services on the 2nd floor of the [facility]  Pt denies SI, a hx of SI, any prior attempts to kill himself, or any plan to kill himself. Pt shares he has been hospitalized in the past for mental health concerns. Pt denies HI, VH, NSSIB, access to guns/weapons, engagement with the legal system, or SA.  Pt's protective factors include no SI or HI. Pt has steady employment.  Pt declined having anyone clinician could make contact with for collateral information.  Pt is oriented x5. His recent/remote memory is intact. Pt was cooperative throughout the assessment process. Pt's insight, judgement, and impulse control is fair at this time.   Recommendations for Services/Supports/Treatments: [facility], NP, reviewed pt's chart and information and determined pt should be observed overnight for safety and stability and re-assessed in the morning by psychiatry.     Patient Reported Information How did you hear about Cole Barrett? Other (Comment) ([name])  Referral name: [name]  Referral phone number: 0 (N/A)   Whom do you see for routine medical problems? I  don't have a doctor  Practice/Facility Name: No data recorded Practice/Facility Phone Number: No data recorded Name of Contact: No data recorded Contact Number: No data recorded Contact Fax Number: No data recorded Prescriber Name: No data recorded Prescriber Address (if known): No data recorded  What Is the Reason for Your Visit/Call Today? Pt states, "I know something bad is going on tonight." He states the AH he typically experiences are worse than usual.  How Long Has This Been Causing You Problems? <Week  What Do You Feel Would Help You the Most Today? Medication; Other (Comment) (Possibly inpatient services)   Have You Recently Been in Any Inpatient Treatment (Hospital/Detox/Crisis Center/28-Day Program)? No ([facility] a couple years ago)  Name/Location of Program/Hospital:No data recorded How Long Were You There? No data recorded When Were You Discharged? No data recorded  Have You Ever Received Services From [facility] Before? Yes  Who Do You See at [facility]? ED visits & [facility] admission   Have You Recently Had Any Thoughts About Hurting Yourself? No  Are You Planning to Commit Suicide/Harm Yourself At This time? No   Have you Recently Had Thoughts About Hurting Someone [name]? No  Explanation: No data recorded  Have You Used Any Alcohol or Drugs in the Past 24 Hours? No  How Long Ago Did You Use Drugs or Alcohol? No data recorded What Did You Use and How Much? No data recorded  Do You Currently Have a Therapist/Psychiatrist? No  Name of Therapist/Psychiatrist: N/A   Have You Been Recently Discharged From Any Office Practice or Programs?  No  Explanation of Discharge From Practice/Program: No data recorded    CCA Screening Triage Referral Assessment Type of Contact: Face-to-Face  Is this Initial or Reassessment? Initial Assessment  Date Telepsych consult ordered in [facility]:  12/01/2020  Time Telepsych consult ordered in [facility]:  0915   Patient Reported Information  Reviewed? Yes  Patient Left Without Being Seen? No data recorded Reason for Not Completing Assessment: No data recorded  Collateral Involvement: Pt states he has no one that can provide for collateral information.   Does Patient Have a Automotive engineer Guardian? No data recorded Name and Contact of Legal Guardian: Self  If Minor and Not Living with Parent(s), Who has Custody? N/A  Is CPS involved or ever been involved? Never  Is APS involved or ever been involved? Never   Patient Determined To Be At Risk for Harm To Self or Others Based on Review of Patient Reported Information or Presenting Complaint? No  Method: No data recorded Availability of Means: No data recorded Intent: No data recorded Notification Required: No data recorded Additional Information for Danger to Others Potential: No data recorded Additional Comments for Danger to Others Potential: No data recorded Are There Guns or Other Weapons in Your Home? No data recorded Types of Guns/Weapons: No data recorded Are These Weapons Safely Secured?                            No data recorded Who Could Verify You Are Able To Have These Secured: No data recorded Do You Have any Outstanding Charges, Pending Court Dates, Parole/Probation? No data recorded Contacted To Inform of Risk of Harm To Self or Others: -- (N/A)   Location of Assessment: [facility] Services   Does Patient Present under Involuntary Commitment? No  IVC Papers Initial File Date: No data recorded  [place] of Residence: [name]   Patient Currently Receiving the Following Services: Not Receiving Services   Determination of Need: Urgent (48 hours)   Options For Referral: [facility]     CCA Biopsychosocial Intake/Chief Complaint:  Worsening auditory hallucinations  Current Symptoms/Problems: Auditory hallucinations of voice telling pt they are going to kill him.   Patient Reported Schizophrenia/Schizoaffective Diagnosis  in Past: Yes   Strengths: Pt reported, relaxing, watching TV.  Preferences: Inpatient psychiatric treatment  Abilities: N/A   Type of Services Patient Feels are Needed: Inpatient psychiatric treatment   Initial Clinical Notes/Concerns: Pt reports little to no social support   Mental Health Symptoms Depression:  None   Duration of Depressive symptoms: No data recorded  Mania:  Change in energy/activity   Anxiety:   Tension   Psychosis:  Hallucinations; Affective flattening/alogia/avolition   Duration of Psychotic symptoms: Greater than six months   Trauma:  None   Obsessions:  None   Compulsions:  None   Inattention:  None   Hyperactivity/Impulsivity:  N/A   Oppositional/Defiant Behaviors:  None   Emotional Irregularity:  N/A   Other Mood/Personality Symptoms:  None noted    Mental Status Exam Appearance and self-care  Stature:  Average   Weight:  Average weight   Clothing:  Casual   Grooming:  Normal   Cosmetic use:  None   Posture/gait:  Normal   Motor activity:  Not Remarkable   Sensorium  Attention:  Normal   Concentration:  Normal   Orientation:  X5   Recall/memory:  Normal   Affect and Mood  Affect:  Blunted  Mood:  Anxious   Relating  Eye contact:  Normal   Facial expression:  Constricted   Attitude toward examiner:  Cooperative   Thought and Language  Speech flow: Clear and Coherent   Thought content:  Appropriate to Mood and Circumstances   Preoccupation:  None   Hallucinations:  Auditory (Hearing voices saying they are going to kill him)   Organization:  No data recorded  [employer] of Knowledge:  Average   Intelligence:  Average   Abstraction:  Normal   Judgement:  Good   Reality Testing:  Adequate   Insight:  Fair; Gaps   Decision Making:  Normal   Social Functioning  Social Maturity:  Isolates; Responsible   Social Judgement:  Normal   Stress  Stressors:  Other (Comment)  (Hallucinations)   Coping Ability:  Deficient supports   Skill Deficits:  Decision making   Supports:  Other (Comment) Probation officer)     Religion: Religion/Spirituality Are You A Religious Person?: No How Might This Affect Treatment?: N/A  Leisure/Recreation: Leisure / Recreation Do You Have Hobbies?: No  Exercise/Diet: Exercise/Diet Do You Exercise?: No Have You Gained or Lost A Significant Amount of Weight in the Past Six Months?: No Do You Follow a Special Diet?: No Do You Have Any Trouble Sleeping?: No   CCA Employment/Education Employment/Work Situation: Employment / Work Situation Employment situation: Employed Where is patient currently employed?: [employer] How long has patient been employed?: 5 years Patient's job has been impacted by current illness: No What is the longest time patient has a held a job?: 5 Years 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 Has patient ever been in the [employer]?: No  Education: Education Is Patient Currently Attending School?: No Last Grade Completed: 12 Name of High School: [employer] Did [place] Graduate From [employer]?: Yes Did Theme park manager?: No Did Designer, television/film set?: No Did You Have Any Special Interests In School?: No Did You Have An Individualized Education Program (IIEP): No Did You Have Any Difficulty At [employer]?: No Patient's Education Has Been Impacted by Current Illness: No   CCA Family/Childhood History Family and Relationship History: Family history Marital status: Single Are you sexually active?: Yes What is your sexual orientation?: Heterosexual  Has your sexual activity been affected by drugs, alcohol, medication, or emotional stress?: N/A  Does patient have children?: No  Childhood History:  Childhood History By whom was/is the patient raised?: Both parents Additional childhood history information: NA Description of patient's relationship with caregiver when  they were a child: Good Patient's description of current relationship with people who raised him/her: Good How were you disciplined when you got in trouble as a child/adolescent?: NA Does patient have siblings?: Yes Number of Siblings: 4 Description of patient's current relationship with siblings: 4 brothers, little contact with them Did patient suffer any verbal/emotional/physical/sexual abuse as a child?: No Did patient suffer from severe childhood neglect?: No Has patient ever been sexually abused/assaulted/raped as an adolescent or adult?: No Was the patient ever a victim of a crime or a disaster?: No Witnessed domestic violence?: No Has patient been affected by domestic violence as an adult?: No  Child/Adolescent Assessment:     CCA Substance Use Alcohol/Drug Use: Alcohol / Drug Use Pain Medications: Please see MAR Prescriptions: Please see MAR Over the Counter: Please see MAR History of alcohol / drug use?: No history of alcohol / drug abuse Longest period of sobriety (when/how long): N/A  ASAM's:  Six Dimensions of Multidimensional Assessment  Dimension 1:  Acute Intoxication and/or Withdrawal Potential:      Dimension 2:  Biomedical Conditions and Complications:      Dimension 3:  Emotional, Behavioral, or Cognitive Conditions and Complications:     Dimension 4:  Readiness to Change:     Dimension 5:  Relapse, Continued use, or Continued Problem Potential:     Dimension 6:  Recovery/Living Environment:     ASAM Severity Score:    ASAM Recommended Level of Treatment:     Substance use Disorder (SUD)    Recommendations for Services/Supports/Treatments: Cole AsperEne Ajibola, NP, reviewed pt's chart and information and determined pt should be observed overnight for safety and stability and re-assessed in the morning by psychiatry.     DSM5 Diagnoses: Patient Active Problem List   Diagnosis Date Noted  . Acute psychosis (HCC) 08/31/2019   . Schizophrenia (HCC) 08/31/2019  . Schizoaffective disorder, bipolar type (HCC) 01/19/2019  . Alcohol abuse   . Auditory hallucinations   . Alcohol abuse with alcohol-induced mood disorder (HCC) 09/08/2016  . Homelessness 09/03/2016  . Person feigning illness 09/03/2016  . 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 Cannabis use disorder, mild, abuse 08/29/2016  . Pulmonary emboli (HCC) 07/13/2016  . Adjustment disorder with emotional disturbance 03/12/2014  . 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 Acute left flank pain 01/02/2014  . Pulmonary embolism, bilateral (HCC) 01/02/2014    Patient Centered Plan: Patient is on the following Treatment Plan(s):  Anxiety and Impulse Control   Referrals to Alternative Service(s): Referred to Alternative Service(s):   Place:   Date:   Time:    Referred to Alternative Service(s):   Place:   Date:   Time:    Referred to Alternative Service(s):   Place:   Date:   Time:    Referred to Alternative Service(s):   Place:   Date:   Time:     Ralph DowdySamantha L Casimiro Lienhard, LMFT

## 2021-01-12 NOTE — ED Triage Notes (Signed)
[facility] transfer, pt presents with auditory hallucinations, saying voices are going to kill him.  Denies SI, HI.

## 2021-01-12 NOTE — ED Provider Notes (Addendum)
Behavioral Health Admission H&P [facility])  Date: 01/12/21 Patient Name: Cole Barrett MRN: [id number] Chief Complaint:  Chief Complaint  Patient presents with  . Hallucinations      Diagnoses:  Final diagnoses:  Schizophrenia, unspecified type (HCC)    HPI: Cole Barrett is a 35y/o male. Patient presented as a transfer from [facility] for psych evaluation after he was given his home dose of Risperdal 1mg . Patient presented voluntarily to with chief complaint of "something bad is going on tonight". Patient reports that he went to [facility] because he would like to speak with a counselor concerning the voices that he is hearing and to request his daily dose of Risperdal 1mg . Patient denies any stressor/triggers. Patient has had multiple ED visits to request daily dose of Risperdal 1mg .   Patient is alert and oriented X4, he is pleasant and cooperative. He appeared unkept and poorly groomed (he was offered shower and new clothing), he speaks in clear, coherent, normal tone and volume. His mood is euthymic, he has good eye contact.   Patient is He endorses ongoing auditory hallucination; he states "I'm hearing voices that are telling me they are going to kill me." He states that the voices are louder than usual and are interfering with his life. He reports that he takes his medication as prescribed. He reports "I have a bottle of my medication, Risperdal at home." Review of record shows that patient has had multiple/frequent visits to the ED (almost everyday) to request his daily dose of Risperdal.  Patient reports that he doesn't have a psychiatrist, and would like to be seem by a Psychiatrist. He also wants to meet with a counselor to "talk to them about the voices, so they can help my situation."  Patient denies SI,HI, visual hallucination, paranoia, and access to guns. He endorses auditory hallucination. Patient states " the voices are telling me they are going to kill me." He  reports that the voices are much louder than usual. He denies illicit drug use, he denies alcohol consumption. He reports that he lives at home alone. He reports that he works at a [employer].     PHQ 2-9:  Flowsheet Row ED from 12/26/2020 in [facility] EMERGENCY DEPARTMENT ED from 06/05/2020 in [facility] EMERGENCY DEPARTMENT  Thoughts that you would be better off dead, or of hurting yourself in some way Not at all Not at all  PHQ-9 Total Score 0 0      Flowsheet Row ED from 01/11/2021 in [facility] ED from 01/10/2021 in [name] EMERGENCY DEPARTMENT ED from 01/09/2021 in [facility]  C-SSRS RISK CATEGORY No Risk No Risk No Risk       Total Time spent with patient: 20 minutes  Musculoskeletal  Strength & Muscle Tone: within normal limits Gait & Station: normal Patient leans: Right  Psychiatric Specialty Exam  Presentation General Appearance: Appropriate for Environment; Fairly Groomed  Eye Contact:Good  Speech:Clear and Coherent  Speech Volume:Normal  Handedness:Right   Mood and Affect  Mood:Euthymic  Affect:Appropriate   Thought Process  Thought Processes:Coherent  Descriptions of Associations:Intact  Orientation:Full (Time, Place and Person)  Thought Content:WDL  Hallucinations:Hallucinations: Auditory (hearing loud voices that wants to kill him.) Description of Auditory Hallucinations: hearing loud voices that wants to kill him.  Ideas of Reference:None  Suicidal Thoughts:Suicidal Thoughts: No  Homicidal Thoughts:Homicidal Thoughts: No   Sensorium  Memory:Immediate Good; Recent Good;  Remote Good  Judgment:Poor  Insight:Poor   Executive Functions  Concentration:Good  Attention Span:Good  Recall:Good  Fund of Knowledge:Fair  Language:Fair   Psychomotor Activity  Psychomotor Activity:Psychomotor  Activity: Normal   Assets  Assets:Communication Skills; Desire for Improvement; Housing; Social Support; Transportation; Vocational/Educational   Sleep  Sleep:Sleep: Good   Physical Exam Vitals and nursing note reviewed.  Constitutional:      Appearance: He is well-developed and well-nourished.  HENT:     Head: Normocephalic and atraumatic.  Eyes:     General:        Right eye: No discharge.        Left eye: No discharge.  Cardiovascular:     Rate and Rhythm: Normal rate.     Heart sounds: No murmur heard.   Pulmonary:     Effort: Pulmonary effort is normal. No respiratory distress.     Breath sounds: Normal breath sounds.  Abdominal:     Palpations: Abdomen is soft.     Tenderness: There is no abdominal tenderness.  Musculoskeletal:        General: No edema.     Cervical back: Normal range of motion.  Skin:    General: Skin is warm and dry.  Neurological:     Mental Status: He is alert and oriented to person, place, and time.  Psychiatric:        Attention and Perception: Attention normal. He is attentive. He perceives auditory hallucinations. He does not perceive visual hallucinations.        Mood and Affect: Mood and affect, mood and affect normal. Mood is not anxious or depressed.        Speech: Speech normal.        Behavior: Behavior normal. Behavior is not agitated, aggressive or hyperactive. Behavior is cooperative.        Thought Content: Thought content is not paranoid or delusional. Thought content does not include homicidal or suicidal ideation. Thought content does not include homicidal or suicidal plan.        Cognition and Memory: Cognition is not impaired. Memory is not impaired. He does not exhibit impaired recent memory or impaired remote memory.        Judgment: Judgment is not impulsive.    Review of Systems  Constitutional: Negative.   HENT: Negative for congestion and hearing loss.   Eyes: Negative for pain, discharge and redness.   Respiratory: Negative for cough, hemoptysis, sputum production and shortness of breath.   Cardiovascular: Negative for chest pain and palpitations.  Gastrointestinal: Negative for nausea and vomiting.  Skin: Negative for itching.  Neurological: Negative for speech change and loss of consciousness.  Psychiatric/Behavioral: Negative for depression, memory loss, substance abuse and suicidal ideas. Hallucinations: auditory hallucination. The patient is not nervous/anxious and does not have insomnia.     Blood pressure 131/72, pulse 75, temperature 97.7 F (36.5 C), temperature source Oral, resp. rate 18, SpO2 100 %. There is no height or weight on file to calculate BMI.  Past Psychiatric History: auditory hallucination, Schizoaffective disorder  Is the patient at risk to self? No  Has the patient been a risk to self in the past 6 months? No .    Has the patient been a risk to self within the distant past? No   Is the patient a risk to others? No   Has the patient been a risk to others in the past 6 months? No   Has the patient been a risk to others within  the distant past? No   Past Medical History:  Past Medical History:  Diagnosis Date  . COVID-19   . Homelessness   . Hypercholesterolemia   . Mental disorder   . Paranoid behavior (HCC)   . PE (pulmonary embolism)   . Schizophrenia (HCC)    History reviewed. No pertinent surgical history.  Family History:  Family History  Problem Relation Age of Onset  . Hypertension Mother     Social History:  Social History   Socioeconomic History  . Marital status: Single    Spouse name: Not on file  . Number of children: Not on file  . Years of education: Not on file  . Highest education level: Not on file  Occupational History  . Occupation: Employed    Comment: [employer]  Tobacco Use  . Smoking status: Current Every Day Smoker    Packs/day: 0.50    Types: Cigarettes  . Smokeless tobacco: Never Used  Vaping Use  .  Vaping Use: Never used  Substance and Sexual Activity  . Alcohol use: Yes    Comment: BAC not available  . Drug use: Yes    Types: Marijuana    Comment: Last used: 2 months ago UDS NA  . Sexual activity: Never  Other Topics Concern  . Not on file  Social History Narrative   ** Merged History Encounter **    Pt lives alone in [place].  He is employed.  He stated that he is followed by [facility], but that he has not been there in a while.   Social Determinants of Health   Financial Resource Strain: Not on file  Food Insecurity: Not on file  Transportation Needs: Not on file  Physical Activity: Not on file  Stress: Not on file  Social Connections: Not on file  Intimate Partner Violence: Not on file    SDOH:  SDOH Screenings   Alcohol Screen: Not on file  Depression (PHQ2-9): Low Risk   . PHQ-2 Score: 0  Financial Resource Strain: Not on file  Food Insecurity: Not on file  Housing: Not on file  Physical Activity: Not on file  Social Connections: Not on file  Stress: Not on file  Tobacco Use: High Risk  . Smoking Tobacco Use: Current Every Day Smoker  . Smokeless Tobacco Use: Never Used  Transportation Needs: Not on file    Last Labs:  Admission on 01/12/2021  Component Date Value Ref Range Status  . POC Amphetamine UR 01/12/2021 None Detected  NONE DETECTED (Cut Off Level 1000 ng/mL) Preliminary  . POC Secobarbital (BAR) 01/12/2021 None Detected  NONE DETECTED (Cut Off Level 300 ng/mL) Preliminary  . POC Buprenorphine (BUP) 01/12/2021 None Detected  NONE DETECTED (Cut Off Level 10 ng/mL) Preliminary  . POC Oxazepam (BZO) 01/12/2021 None Detected  NONE DETECTED (Cut Off Level 300 ng/mL) Preliminary  . POC Cocaine UR 01/12/2021 None Detected  NONE DETECTED (Cut Off Level 300 ng/mL) Preliminary  . POC Methamphetamine UR 01/12/2021 None Detected  NONE DETECTED (Cut Off Level 1000 ng/mL) Preliminary  . POC Morphine 01/12/2021 None Detected  NONE DETECTED (Cut Off Level 300  ng/mL) Preliminary  . POC Oxycodone UR 01/12/2021 None Detected  NONE DETECTED (Cut Off Level 100 ng/mL) Preliminary  . POC Methadone UR 01/12/2021 None Detected  NONE DETECTED (Cut Off Level 300 ng/mL) Preliminary  . POC Marijuana UR 01/12/2021 None Detected  NONE DETECTED (Cut Off Level 50 ng/mL) Preliminary  Admission on 01/11/2021, Discharged on 01/12/2021  Component  Date Value Ref Range Status  . SARS Coronavirus 2 Ag 01/12/2021 NEGATIVE  NEGATIVE Final   Comment: (NOTE) SARS-CoV-2 antigen NOT DETECTED.   Negative results are presumptive.  Negative results do not preclude SARS-CoV-2 infection and should not be used as the sole basis for treatment or other patient management decisions, including infection  control decisions, particularly in the presence of clinical signs and  symptoms consistent with COVID-19, or in those who have been in contact with the virus.  Negative results must be combined with clinical observations, patient history, and epidemiological information. The expected result is Negative.  Fact Sheet for Patients: https://www.jennings-kim.com/https://www.fda.gov/media/141569/download  Fact Sheet for Healthcare Providers: https://alexander-rogers.biz/https://www.fda.gov/media/141568/download  This test is not yet approved or cleared by the [place] FDA and  has been authorized for detection and/or diagnosis of SARS-CoV-2 by FDA under an Emergency Use Authorization (EUA).  This EUA will remain in effect (meaning this test can be used) for the duration of  the COV                          ID-19 declaration under Section 564(b)(1) of the Act, 21 U.S.C. section 360bbb-3(b)(1), unless the authorization is terminated or revoked sooner.    Admission on 12/29/2020, Discharged on 12/30/2020  Component Date Value Ref Range Status  . Sodium 12/30/2020 137  135 - 145 mmol/L Final  . Potassium 12/30/2020 3.9  3.5 - 5.1 mmol/L Final  . Chloride 12/30/2020 102  98 - 111 mmol/L Final  . CO2 12/30/2020 23  22 - 32 mmol/L Final   . Glucose, Bld 12/30/2020 109* 70 - 99 mg/dL Final   Glucose reference range applies only to samples taken after fasting for at least 8 hours.  . BUN 12/30/2020 12  6 - 20 mg/dL Final  . Creatinine, Ser 12/30/2020 0.96  0.61 - 1.24 mg/dL Final  . Calcium 16/10/960402/12/2020 8.7* 8.9 - 10.3 mg/dL Final  . Total Protein 12/30/2020 6.7  6.5 - 8.1 g/dL Final  . Albumin 54/09/811902/12/2020 3.8  3.5 - 5.0 g/dL Final  . AST 14/78/295602/12/2020 30  15 - 41 U/L Final  . ALT 12/30/2020 35  0 - 44 U/L Final  . Alkaline Phosphatase 12/30/2020 49  38 - 126 U/L Final  . Total Bilirubin 12/30/2020 0.4  0.3 - 1.2 mg/dL Final  . GFR, Estimated 12/30/2020 >60  >60 mL/min Final   Comment: (NOTE) Calculated using the CKD-EPI Creatinine Equation (2021)   . Anion gap 12/30/2020 12  5 - 15 Final   Performed at [facility], [address] [place], [place] [address]  . Alcohol, Ethyl (B) 12/30/2020 <10  <10 mg/dL Final   Comment: (NOTE) Lowest detectable limit for serum alcohol is 10 mg/dL.  For medical purposes only. Performed at [facility], [address] [place], [place] [address]   . WBC 12/30/2020 5.0  4.0 - 10.5 K/uL Final  . RBC 12/30/2020 4.65  4.22 - 5.81 MIL/uL Final  . Hemoglobin 12/30/2020 13.1  13.0 - 17.0 g/dL Final  . HCT 69/62/952802/12/2020 40.4  39.0 - 52.0 % Final  . MCV 12/30/2020 86.9  80.0 - 100.0 fL Final  . MCH 12/30/2020 28.2  26.0 - 34.0 pg Final  . MCHC 12/30/2020 32.4  30.0 - 36.0 g/dL Final  . RDW 41/32/440102/12/2020 12.7  11.5 - 15.5 % Final  . Platelets 12/30/2020 284  150 - 400 K/uL Final  . nRBC 12/30/2020 0.0  0.0 - 0.2 % Final   Performed at [facility], [address], [place], [place] [address]  . Opiates 12/30/2020 NONE DETECTED  NONE DETECTED Final  . Cocaine 12/30/2020 NONE DETECTED  NONE DETECTED Final  . Benzodiazepines 12/30/2020 NONE DETECTED  NONE DETECTED Final  . Amphetamines 12/30/2020 NONE DETECTED  NONE DETECTED Final  . Tetrahydrocannabinol 12/30/2020 NONE DETECTED  NONE  DETECTED Final  . Barbiturates 12/30/2020 NONE DETECTED  NONE DETECTED Final   Comment: (NOTE) DRUG SCREEN FOR MEDICAL PURPOSES ONLY.  IF CONFIRMATION IS NEEDED FOR ANY PURPOSE, NOTIFY LAB WITHIN 5 DAYS.  LOWEST DETECTABLE LIMITS FOR URINE DRUG SCREEN Drug Class                     Cutoff (ng/mL) Amphetamine and metabolites    1000 Barbiturate and metabolites    200 Benzodiazepine                 200 Tricyclics and metabolites     300 Opiates and metabolites        300 Cocaine and metabolites        300 THC                            50 Performed at [facility], [address] [place], [place] [address]   Admission on 12/26/2020, Discharged on 12/26/2020  Component Date Value Ref Range Status  . Sodium 12/26/2020 139  135 - 145 mmol/L Final  . Potassium 12/26/2020 3.7  3.5 - 5.1 mmol/L Final  . Chloride 12/26/2020 105  98 - 111 mmol/L Final  . CO2 12/26/2020 23  22 - 32 mmol/L Final  . Glucose, Bld 12/26/2020 105* 70 - 99 mg/dL Final   Glucose reference range applies only to samples taken after fasting for at least 8 hours.  . BUN 12/26/2020 13  6 - 20 mg/dL Final  . Creatinine, Ser 12/26/2020 1.07  0.61 - 1.24 mg/dL Final  . Calcium 09/81/1914 8.8* 8.9 - 10.3 mg/dL Final  . Total Protein 12/26/2020 6.7  6.5 - 8.1 g/dL Final  . Albumin 78/29/5621 3.7  3.5 - 5.0 g/dL Final  . AST 30/86/5784 24  15 - 41 U/L Final  . ALT 12/26/2020 34  0 - 44 U/L Final  . Alkaline Phosphatase 12/26/2020 51  38 - 126 U/L Final  . Total Bilirubin 12/26/2020 0.6  0.3 - 1.2 mg/dL Final  . GFR, Estimated 12/26/2020 >60  >60 mL/min Final   Comment: (NOTE) Calculated using the CKD-EPI Creatinine Equation (2021)   . Anion gap 12/26/2020 11  5 - 15 Final   Performed at [facility], [address], [place], [place] [address]  . Alcohol, Ethyl (B) 12/26/2020 16* <10 mg/dL Final   Comment: (NOTE) Lowest detectable limit for serum alcohol is 10 mg/dL.  For medical purposes  only. Performed at [facility], [address] [place], [place] [address]   . WBC 12/26/2020 4.1  4.0 - 10.5 K/uL Final  . RBC 12/26/2020 4.49  4.22 - 5.81 MIL/uL Final  . Hemoglobin 12/26/2020 12.3* 13.0 - 17.0 g/dL Final  . HCT 32/44/0102 39.1  39.0 - 52.0 % Final  . MCV 12/26/2020 87.1  80.0 - 100.0 fL Final  . MCH 12/26/2020 27.4  26.0 - 34.0 pg Final  . MCHC 12/26/2020 31.5  30.0 - 36.0 g/dL Final  . RDW 72/53/6644  12.8  11.5 - 15.5 % Final  . Platelets 12/26/2020 270  150 - 400 K/uL Final  . nRBC 12/26/2020 0.0  0.0 - 0.2 % Final   Performed at [facility], [address] [place], [place] [address]  . Opiates 12/26/2020 NONE DETECTED  NONE DETECTED Final  . Cocaine 12/26/2020 NONE DETECTED  NONE DETECTED Final  . Benzodiazepines 12/26/2020 NONE DETECTED  NONE DETECTED Final  . Amphetamines 12/26/2020 NONE DETECTED  NONE DETECTED Final  . Tetrahydrocannabinol 12/26/2020 NONE DETECTED  NONE DETECTED Final  . Barbiturates 12/26/2020 NONE DETECTED  NONE DETECTED Final   Comment: (NOTE) DRUG SCREEN FOR MEDICAL PURPOSES ONLY.  IF CONFIRMATION IS NEEDED FOR ANY PURPOSE, NOTIFY LAB WITHIN 5 DAYS.  LOWEST DETECTABLE LIMITS FOR URINE DRUG SCREEN Drug Class                     Cutoff (ng/mL) Amphetamine and metabolites    1000 Barbiturate and metabolites    200 Benzodiazepine                 200 Tricyclics and metabolites     300 Opiates and metabolites        300 Cocaine and metabolites        300 THC                            50 Performed at [facility], [address] [place], [place] [address]   . Acetaminophen (Tylenol), Serum 12/26/2020 <10* 10 - 30 ug/mL Final   Comment: (NOTE) Therapeutic concentrations vary significantly. A range of 10-30 ug/mL  may be an effective concentration for many patients. However, some  are best treated at concentrations outside of this range. Acetaminophen concentrations >150 ug/mL at 4 hours after ingestion  and  >50 ug/mL at 12 hours after ingestion are often associated with  toxic reactions.  Performed at [facility], [address], [place], [place] [address]   Admission on 12/01/2020, Discharged on 12/01/2020  Component Date Value Ref Range Status  . Sodium 12/01/2020 135  135 - 145 mmol/L Final  . Potassium 12/01/2020 4.1  3.5 - 5.1 mmol/L Final  . Chloride 12/01/2020 105  98 - 111 mmol/L Final  . CO2 12/01/2020 21* 22 - 32 mmol/L Final  . Glucose, Bld 12/01/2020 88  70 - 99 mg/dL Final   Glucose reference range applies only to samples taken after fasting for at least 8 hours.  . BUN 12/01/2020 9  6 - 20 mg/dL Final  . Creatinine, Ser 12/01/2020 0.85  0.61 - 1.24 mg/dL Final  . Calcium 32/99/2426 8.5* 8.9 - 10.3 mg/dL Final  . GFR, Estimated 12/01/2020 >60  >60 mL/min Final   Comment: (NOTE) Calculated using the CKD-EPI Creatinine Equation (2021)   . Anion gap 12/01/2020 9  5 - 15 Final   Performed at [facility], [address] [place], [place] [address]  . WBC 12/01/2020 2.6* 4.0 - 10.5 K/uL Final  . RBC 12/01/2020 4.52  4.22 - 5.81 MIL/uL Final  . Hemoglobin 12/01/2020 12.3* 13.0 - 17.0 g/dL Final  . HCT 62/22/9798 39.7  39.0 - 52.0 % Final  . MCV 12/01/2020 87.8  80.0 - 100.0 fL Final  . MCH 12/01/2020 27.2  26.0 - 34.0 pg Final  . MCHC 12/01/2020 31.0  30.0 - 36.0 g/dL Final  . RDW 92/09/9416 12.7  11.5 -  15.5 % Final  . Platelets 12/01/2020 230  150 - 400 K/uL Final  . nRBC 12/01/2020 0.0  0.0 - 0.2 % Final  . Neutrophils Relative % 12/01/2020 33  % Final  . Neutro Abs 12/01/2020 0.9* 1.7 - 7.7 K/uL Final  . Lymphocytes Relative 12/01/2020 46  % Final  . Lymphs Abs 12/01/2020 1.2  0.7 - 4.0 K/uL Final  . Monocytes Relative 12/01/2020 13  % Final  . Monocytes Absolute 12/01/2020 0.3  0.1 - 1.0 K/uL Final  . Eosinophils Relative 12/01/2020 7  % Final  . Eosinophils Absolute 12/01/2020 0.2  0.0 - 0.5 K/uL Final  . Basophils Relative 12/01/2020 0  % Final  .  Basophils Absolute 12/01/2020 0.0  0.0 - 0.1 K/uL Final  . WBC Morphology 12/01/2020 See Note   Final   >10% reactive, Benign Lymphocytes.  . Immature Granulocytes 12/01/2020 1  % Final  . Abs Immature Granulocytes 12/01/2020 0.02  0.00 - 0.07 K/uL Final   Performed at [facility], [address] [place], [place] [address]  . Alcohol, Ethyl (B) 12/01/2020 <10  <10 mg/dL Final   Comment: (NOTE) Lowest detectable limit for serum alcohol is 10 mg/dL.  For medical purposes only. Performed at [facility], [address] [place], [place] [address]   . Opiates 12/01/2020 NONE DETECTED  NONE DETECTED Final  . Cocaine 12/01/2020 NONE DETECTED  NONE DETECTED Final  . Benzodiazepines 12/01/2020 NONE DETECTED  NONE DETECTED Final  . Amphetamines 12/01/2020 NONE DETECTED  NONE DETECTED Final  . Tetrahydrocannabinol 12/01/2020 NONE DETECTED  NONE DETECTED Final  . Barbiturates 12/01/2020 NONE DETECTED  NONE DETECTED Final   Comment: (NOTE) DRUG SCREEN FOR MEDICAL PURPOSES ONLY.  IF CONFIRMATION IS NEEDED FOR ANY PURPOSE, NOTIFY LAB WITHIN 5 DAYS.  LOWEST DETECTABLE LIMITS FOR URINE DRUG SCREEN Drug Class                     Cutoff (ng/mL) Amphetamine and metabolites    1000 Barbiturate and metabolites    200 Benzodiazepine                 200 Tricyclics and metabolites     300 Opiates and metabolites        300 Cocaine and metabolites        300 THC                            50 Performed at [facility], [address] [place] [address] [place], [place] [address]   . SARS Coronavirus 2 by RT PCR 12/01/2020 POSITIVE* NEGATIVE Final   Comment: emailed L. Berdik RN 11:35 12/01/20 ([email or web address]) (NOTE) SARS-CoV-2 target nucleic acids are DETECTED.  The SARS-CoV-2 RNA is generally detectable in upper respiratory specimens during the acute phase of infection. Positive results are indicative of the presence of the identified virus, but do not rule out bacterial infection or co-infection with  other pathogens not detected by the test. Clinical correlation with patient history and other diagnostic information is necessary to determine patient infection status. The expected result is Negative.  Fact Sheet for Patients: BloggerCourse.com  Fact Sheet for Healthcare Providers: SeriousBroker.it  This test is not yet approved or cleared by the [place] FDA and  has been authorized for detection and/or diagnosis of SARS-CoV-2 by FDA under an Emergency Use Authorization (EUA).  This EUA will remain in effect (meaning this test can  be used) for the duration of  the COVID-19                           declaration under Section 564(b)(1) of the Act, 21 U.S.C. section 360bbb-3(b)(1), unless the authorization is terminated or revoked sooner.    . Influenza A by PCR 12/01/2020 NEGATIVE  NEGATIVE Final  . Influenza B by PCR 12/01/2020 NEGATIVE  NEGATIVE Final   Comment: (NOTE) The Xpert Xpress SARS-CoV-2/FLU/RSV plus assay is intended as an aid in the diagnosis of influenza from Nasopharyngeal swab specimens and should not be used as a sole basis for treatment. Nasal washings and aspirates are unacceptable for Xpert Xpress SARS-CoV-2/FLU/RSV testing.  Fact Sheet for Patients: BloggerCourse.com  Fact Sheet for Healthcare Providers: SeriousBroker.it  This test is not yet approved or cleared by the [place] FDA and has been authorized for detection and/or diagnosis of SARS-CoV-2 by FDA under an Emergency Use Authorization (EUA). This EUA will remain in effect (meaning this test can be used) for the duration of the COVID-19 declaration under Section 564(b)(1) of the Act, 21 U.S.C. section 360bbb-3(b)(1), unless the authorization is terminated or revoked.  Performed at [facility], [address] [place], [place] [address]   Admission on 11/06/2020, Discharged on  11/07/2020  Component Date Value Ref Range Status  . SARS Coronavirus 2 by RT PCR 11/07/2020 NEGATIVE  NEGATIVE Final   Comment: (NOTE) SARS-CoV-2 target nucleic acids are NOT DETECTED.  The SARS-CoV-2 RNA is generally detectable in upper respiratory specimens during the acute phase of infection. The lowest concentration of SARS-CoV-2 viral copies this assay can detect is 138 copies/mL. A negative result does not preclude SARS-Cov-2 infection and should not be used as the sole basis for treatment or other patient management decisions. A negative result may occur with  improper specimen collection/handling, submission of specimen other than nasopharyngeal swab, presence of viral mutation(s) within the areas targeted by this assay, and inadequate number of viral copies(<138 copies/mL). A negative result must be combined with clinical observations, patient history, and epidemiological information. The expected result is Negative.  Fact Sheet for Patients:  BloggerCourse.com  Fact Sheet for Healthcare Providers:  SeriousBroker.it  This test is no                          t yet approved or cleared by the [place] FDA and  has been authorized for detection and/or diagnosis of SARS-CoV-2 by FDA under an Emergency Use Authorization (EUA). This EUA will remain  in effect (meaning this test can be used) for the duration of the COVID-19 declaration under Section 564(b)(1) of the Act, 21 U.S.C.section 360bbb-3(b)(1), unless the authorization is terminated  or revoked sooner.      . Influenza A by PCR 11/07/2020 NEGATIVE  NEGATIVE Final  . Influenza B by PCR 11/07/2020 NEGATIVE  NEGATIVE Final   Comment: (NOTE) The Xpert Xpress SARS-CoV-2/FLU/RSV plus assay is intended as an aid in the diagnosis of influenza from Nasopharyngeal swab specimens and should not be used as a sole basis for treatment. Nasal washings and aspirates are  unacceptable for Xpert Xpress SARS-CoV-2/FLU/RSV testing.  Fact Sheet for Patients: BloggerCourse.com  Fact Sheet for Healthcare Providers: SeriousBroker.it  This test is not yet approved or cleared by the [place] FDA and has been authorized for detection and/or diagnosis of SARS-CoV-2 by FDA under an Emergency Use Authorization (EUA). This EUA  will remain in effect (meaning this test can be used) for the duration of the COVID-19 declaration under Section 564(b)(1) of the Act, 21 U.S.C. section 360bbb-3(b)(1), unless the authorization is terminated or revoked.  Performed at [facility], [address], [place], [place] [address]   Admission on 10/27/2020, Discharged on 10/27/2020  Component Date Value Ref Range Status  . SARS Coronavirus 2 by RT PCR 10/27/2020 NEGATIVE  NEGATIVE Final   Comment: (NOTE) SARS-CoV-2 target nucleic acids are NOT DETECTED.  The SARS-CoV-2 RNA is generally detectable in upper respiratory specimens during the acute phase of infection. The lowest concentration of SARS-CoV-2 viral copies this assay can detect is 138 copies/mL. A negative result does not preclude SARS-Cov-2 infection and should not be used as the sole basis for treatment or other patient management decisions. A negative result may occur with  improper specimen collection/handling, submission of specimen other than nasopharyngeal swab, presence of viral mutation(s) within the areas targeted by this assay, and inadequate number of viral copies(<138 copies/mL). A negative result must be combined with clinical observations, patient history, and epidemiological information. The expected result is Negative.  Fact Sheet for Patients:  BloggerCourse.com  Fact Sheet for Healthcare Providers:  SeriousBroker.it  This test is no                          t yet approved or  cleared by the [place] FDA and  has been authorized for detection and/or diagnosis of SARS-CoV-2 by FDA under an Emergency Use Authorization (EUA). This EUA will remain  in effect (meaning this test can be used) for the duration of the COVID-19 declaration under Section 564(b)(1) of the Act, 21 U.S.C.section 360bbb-3(b)(1), unless the authorization is terminated  or revoked sooner.      . Influenza A by PCR 10/27/2020 NEGATIVE  NEGATIVE Final  . Influenza B by PCR 10/27/2020 NEGATIVE  NEGATIVE Final   Comment: (NOTE) The Xpert Xpress SARS-CoV-2/FLU/RSV plus assay is intended as an aid in the diagnosis of influenza from Nasopharyngeal swab specimens and should not be used as a sole basis for treatment. Nasal washings and aspirates are unacceptable for Xpert Xpress SARS-CoV-2/FLU/RSV testing.  Fact Sheet for Patients: BloggerCourse.com  Fact Sheet for Healthcare Providers: SeriousBroker.it  This test is not yet approved or cleared by the [place] FDA and has been authorized for detection and/or diagnosis of SARS-CoV-2 by FDA under an Emergency Use Authorization (EUA). This EUA will remain in effect (meaning this test can be used) for the duration of the COVID-19 declaration under Section 564(b)(1) of the Act, 21 U.S.C. section 360bbb-3(b)(1), unless the authorization is terminated or revoked.  Performed at [facility], [address] [place], [place] [address]   Admission on 10/17/2020, Discharged on 10/17/2020  Component Date Value Ref Range Status  . SARS Coronavirus 2 Ag 10/17/2020 NEGATIVE  NEGATIVE Final   Comment: (NOTE) SARS-CoV-2 antigen NOT DETECTED.   Negative results are presumptive.  Negative results do not preclude SARS-CoV-2 infection and should not be used as the sole basis for treatment or other patient management decisions, including infection  control decisions, particularly in the  presence of clinical signs and  symptoms consistent with COVID-19, or in those who have been in contact with the virus.  Negative results must be combined with clinical observations, patient history, and epidemiological information. The expected result is Negative.  Fact Sheet for Patients: https://sanders-williams.net/  Fact Sheet for Healthcare  Providers: https://martinez.com/   This test is not yet approved or cleared by the [place] and  has been authorized for detection and/or diagnosis of SARS-CoV-2 by FDA under an Emergency Use Authorization (EUA).  This EUA will remain in effect (meaning this test can be used) for the duration of  the C                          OVID-19 declaration under Section 564(b)(1) of the Act, 21 U.S.C. section 360bbb-3(b)(1), unless the authorization is terminated or revoked sooner.    Admission on 09/30/2020, Discharged on 09/30/2020  Component Date Value Ref Range Status  . Sodium 09/30/2020 139  135 - 145 mmol/L Final  . Potassium 09/30/2020 3.8  3.5 - 5.1 mmol/L Final  . Chloride 09/30/2020 104  98 - 111 mmol/L Final  . CO2 09/30/2020 24  22 - 32 mmol/L Final  . Glucose, Bld 09/30/2020 94  70 - 99 mg/dL Final   Glucose reference range applies only to samples taken after fasting for at least 8 hours.  . BUN 09/30/2020 12  6 - 20 mg/dL Final  . Creatinine, Ser 09/30/2020 1.03  0.61 - 1.24 mg/dL Final  . Calcium 16/08/9603 9.1  8.9 - 10.3 mg/dL Final  . Total Protein 09/30/2020 7.4  6.5 - 8.1 g/dL Final  . Albumin 54/07/8118 4.1  3.5 - 5.0 g/dL Final  . AST 14/78/2956 33  15 - 41 U/L Final  . ALT 09/30/2020 50* 0 - 44 U/L Final  . Alkaline Phosphatase 09/30/2020 57  38 - 126 U/L Final  . Total Bilirubin 09/30/2020 0.5  0.3 - 1.2 mg/dL Final  . GFR, Estimated 09/30/2020 >60  >60 mL/min Final   Comment: (NOTE) Calculated using the CKD-EPI Creatinine Equation (2021)   . Anion gap 09/30/2020 11  5 - 15  Final   Performed at [facility], [address], [place], [place] [address]  . Alcohol, Ethyl (B) 09/30/2020 70* <10 mg/dL Final   Comment: (NOTE) Lowest detectable limit for serum alcohol is 10 mg/dL.  For medical purposes only. Performed at [facility], [address] [place], [place] [address]   . WBC 09/30/2020 5.2  4.0 - 10.5 K/uL Final  . RBC 09/30/2020 4.88  4.22 - 5.81 MIL/uL Final  . Hemoglobin 09/30/2020 13.4  13.0 - 17.0 g/dL Final  . HCT 69/62/9528 42.4  39.0 - 52.0 % Final  . MCV 09/30/2020 86.9  80.0 - 100.0 fL Final  . MCH 09/30/2020 27.5  26.0 - 34.0 pg Final  . MCHC 09/30/2020 31.6  30.0 - 36.0 g/dL Final  . RDW 41/32/4401 12.5  11.5 - 15.5 % Final  . Platelets 09/30/2020 307  150 - 400 K/uL Final  . nRBC 09/30/2020 0.0  0.0 - 0.2 % Final   Performed at [facility], [address] [place], [place] [address]  . Opiates 09/30/2020 NONE DETECTED  NONE DETECTED Final  . Cocaine 09/30/2020 NONE DETECTED  NONE DETECTED Final  . Benzodiazepines 09/30/2020 NONE DETECTED  NONE DETECTED Final  . Amphetamines 09/30/2020 NONE DETECTED  NONE DETECTED Final  . Tetrahydrocannabinol 09/30/2020 NONE DETECTED  NONE DETECTED Final  . Barbiturates 09/30/2020 NONE DETECTED  NONE DETECTED Final   Comment: (NOTE) DRUG SCREEN FOR MEDICAL PURPOSES ONLY.  IF CONFIRMATION IS NEEDED FOR ANY PURPOSE, NOTIFY LAB WITHIN 5 DAYS.  LOWEST DETECTABLE LIMITS FOR URINE DRUG SCREEN Drug  Class                     Cutoff (ng/mL) Amphetamine and metabolites    1000 Barbiturate and metabolites    200 Benzodiazepine                 200 Tricyclics and metabolites     300 Opiates and metabolites        300 Cocaine and metabolites        300 THC                            50 Performed at [facility], [address] [place], [place] [address]   . Acetaminophen (Tylenol), Serum 09/30/2020 <10* 10 - 30 ug/mL Final   Comment: (NOTE) Therapeutic concentrations vary  significantly. A range of 10-30 ug/mL  may be an effective concentration for many patients. However, some  are best treated at concentrations outside of this range. Acetaminophen concentrations >150 ug/mL at 4 hours after ingestion  and >50 ug/mL at 12 hours after ingestion are often associated with  toxic reactions.  Performed at [facility], [address], [place], [place] [address]   . Salicylate Lvl 09/30/2020 <7.0* 7.0 - 30.0 mg/dL Final   Performed at [facility], [address], [place], [place] [address]  Admission on 09/15/2020, Discharged on 09/16/2020  Component Date Value Ref Range Status  . Sodium 09/15/2020 138  135 - 145 mmol/L Final  . Potassium 09/15/2020 3.5  3.5 - 5.1 mmol/L Final  . Chloride 09/15/2020 104  98 - 111 mmol/L Final  . CO2 09/15/2020 22  22 - 32 mmol/L Final  . Glucose, Bld 09/15/2020 90  70 - 99 mg/dL Final   Glucose reference range applies only to samples taken after fasting for at least 8 hours.  . BUN 09/15/2020 10  6 - 20 mg/dL Final  . Creatinine, Ser 09/15/2020 0.96  0.61 - 1.24 mg/dL Final  . Calcium 60/60/0459 8.8* 8.9 - 10.3 mg/dL Final  . Total Protein 09/15/2020 7.5  6.5 - 8.1 g/dL Final  . Albumin 97/74/1423 4.1  3.5 - 5.0 g/dL Final  . AST 95/32/0233 33  15 - 41 U/L Final  . ALT 09/15/2020 49* 0 - 44 U/L Final  . Alkaline Phosphatase 09/15/2020 59  38 - 126 U/L Final  . Total Bilirubin 09/15/2020 0.8  0.3 - 1.2 mg/dL Final  . GFR, Estimated 09/15/2020 >60  >60 mL/min Final  . Anion gap 09/15/2020 12  5 - 15 Final   Performed at [facility], [address], [place], [place] [address]  . Alcohol, Ethyl (B) 09/15/2020 29* <10 mg/dL Final   Comment: (NOTE) Lowest detectable limit for serum alcohol is 10 mg/dL.  For medical purposes only. Performed at [facility], [address], [place], [place] [address]   . Salicylate Lvl 09/15/2020 <7.0* 7.0 - 30.0 mg/dL Final   Performed at  [facility], [address] [place], [place] [address]  . Acetaminophen (Tylenol), Serum 09/15/2020 <10* 10 - 30 ug/mL Final   Comment: (NOTE) Therapeutic concentrations vary significantly. A range of 10-30 ug/mL  may be an effective concentration for many patients. However, some  are best treated at concentrations outside of this range. Acetaminophen concentrations >150 ug/mL at 4 hours after ingestion  and >50 ug/mL at 12 hours after ingestion are often  associated with  toxic reactions.  Performed at [facility], [address] [place], [place] [address]   . WBC 09/15/2020 4.8  4.0 - 10.5 K/uL Final  . RBC 09/15/2020 4.73  4.22 - 5.81 MIL/uL Final  . Hemoglobin 09/15/2020 13.4  13.0 - 17.0 g/dL Final  . HCT 00/17/4944 40.9  39.0 - 52.0 % Final  . MCV 09/15/2020 86.5  80.0 - 100.0 fL Final  . MCH 09/15/2020 28.3  26.0 - 34.0 pg Final  . MCHC 09/15/2020 32.8  30.0 - 36.0 g/dL Final  . RDW 96/75/9163 12.6  11.5 - 15.5 % Final  . Platelets 09/15/2020 296  150 - 400 K/uL Final  . nRBC 09/15/2020 0.0  0.0 - 0.2 % Final   Performed at [facility], [address], [place], [place] [address]  . Opiates 09/15/2020 NONE DETECTED  NONE DETECTED Final  . Cocaine 09/15/2020 NONE DETECTED  NONE DETECTED Final  . Benzodiazepines 09/15/2020 NONE DETECTED  NONE DETECTED Final  . Amphetamines 09/15/2020 NONE DETECTED  NONE DETECTED Final  . Tetrahydrocannabinol 09/15/2020 NONE DETECTED  NONE DETECTED Final  . Barbiturates 09/15/2020 NONE DETECTED  NONE DETECTED Final   Comment: (NOTE) DRUG SCREEN FOR MEDICAL PURPOSES ONLY.  IF CONFIRMATION IS NEEDED FOR ANY PURPOSE, NOTIFY LAB WITHIN 5 DAYS.  LOWEST DETECTABLE LIMITS FOR URINE DRUG SCREEN Drug Class                     Cutoff (ng/mL) Amphetamine and metabolites    1000 Barbiturate and metabolites    200 Benzodiazepine                 200 Tricyclics and metabolites     300 Opiates  and metabolites        300 Cocaine and metabolites        300 THC                            50 Performed at [facility], [address], [place], [place] [address]   . SARS Coronavirus 2 by RT PCR 09/15/2020 NEGATIVE  NEGATIVE Final   Comment: (NOTE) SARS-CoV-2 target nucleic acids are NOT DETECTED.  The SARS-CoV-2 RNA is generally detectable in upper respiratoy specimens during the acute phase of infection. The lowest concentration of SARS-CoV-2 viral copies this assay can detect is 131 copies/mL. A negative result does not preclude SARS-Cov-2 infection and should not be used as the sole basis for treatment or other patient management decisions. A negative result may occur with  improper specimen collection/handling, submission of specimen other than nasopharyngeal swab, presence of viral mutation(s) within the areas targeted by this assay, and inadequate number of viral copies (<131 copies/mL). A negative result must be combined with clinical observations, patient history, and epidemiological information. The expected result is Negative.  Fact Sheet for Patients:  https://www.moore.com/  Fact Sheet for Healthcare Providers:  https://www.young.biz/  This test is no                          t yet approved or cleared by the [place] FDA and  has been authorized for detection and/or diagnosis of SARS-CoV-2 by FDA under an Emergency Use Authorization (EUA). This EUA will remain  in effect (meaning this test can be used) for the duration of the COVID-19 declaration under Section 564(b)(1) of the Act,  21 U.S.C. section 360bbb-3(b)(1), unless the authorization is terminated or revoked sooner.    . Influenza A by PCR 09/15/2020 NEGATIVE  NEGATIVE Final  . Influenza B by PCR 09/15/2020 NEGATIVE  NEGATIVE Final   Comment: (NOTE) The Xpert Xpress SARS-CoV-2/FLU/RSV assay is intended as an aid in  the diagnosis of  influenza from Nasopharyngeal swab specimens and  should not be used as a sole basis for treatment. Nasal washings and  aspirates are unacceptable for Xpert Xpress SARS-CoV-2/FLU/RSV  testing.  Fact Sheet for Patients: https://www.moore.com/  Fact Sheet for Healthcare Providers: https://www.young.biz/  This test is not yet approved or cleared by the [place] FDA and  has been authorized for detection and/or diagnosis of SARS-CoV-2 by  FDA under an Emergency Use Authorization (EUA). This EUA will remain  in effect (meaning this test can be used) for the duration of the  Covid-19 declaration under Section 564(b)(1) of the Act, 21  U.S.C. section 360bbb-3(b)(1), unless the authorization is  terminated or revoked. Performed at [facility], [address], [place], [place] [address]   There may be more visits with results that are not included.    Allergies: Haldol [haloperidol]  PTA Medications: (Not in a hospital admission)   Medical Decision Making  Continue home medication Risperdal  for Schizophrenia Admit patient [facility] for observation and follow up evaluation in the am.  Recommendations  Based on my evaluation the patient does not appear to have an emergency medical condition. Patient was recommended multiple times in the past to follow up at [facility] with a psychiatrist and [facility] but failed to show up and continue to present to the ED. Will admit patient to [facility] for observation and medication management.   Maricela Bo, NP 01/12/21  5:37 AM

## 2021-01-12 NOTE — Discharge Instructions (Addendum)
[facility] facility[facility] during walk in hours for appointment with psychiatrist for further medication management and for therapy.   Walk in hours are 8-11 AM Monday through Thursday for medication management.It is first come, first -serve; it is best to arrive by 7:00 AM. On Friday from 1 pm to 4 pm for therapy intake only. Please arrive by 12:00 pm as it is  first come, first -serve.   When you arrive please go upstairs for your appointment. If you are unsure of where to go, inform the front desk that you are here for a walk in appointment and they will assist you with directions upstairs.  Address:  [address], in [place], [address] Ph: [phone]   Patient is instructed prior to discharge to: Take all medications as prescribed by his/her mental healthcare provider. Report any adverse effects and or reactions from the medicines to his/her outpatient provider promptly. Patient has been instructed & cautioned: To not engage in alcohol and or illegal drug use while on prescription medicines. In the event of worsening symptoms, patient is instructed to call the crisis hotline, 911 and or go to the nearest ED for appropriate evaluation and treatment of symptoms. To follow-up with his/her primary care provider for your other medical issues, concerns and or health care needs.

## 2021-01-12 NOTE — Progress Notes (Signed)
Received Cole Barrett this AM awake in his chair, he received his discharge order, AVS was presented to him and questions answered. He received his medications and retrieved his personal belonging.

## 2021-01-12 NOTE — ED Provider Notes (Signed)
FBC/OBS ASAP Discharge Summary  Date and Time: 01/12/2021 8:55 AM  Name: Cole Barrett  MRN:  [id number]   Discharge Diagnoses:  Final diagnoses:  Schizophrenia, unspecified type (HCC)    Subjective:  Patient sleeping on approach, but he is easily awakened and is calm, cooperative, and pleasant on interview. He states that he came to the hospital yesterday because the voices were getting worse and that he needed "medication and rest". He states that he feels better now that he has been started on his medication. He denies SI/HI/AVH. Discussed open access hours at the [facility] and that he will be provided with 7 day samples of risperdal 1 mg. Encouraged patient to follow up during open access to establish care as he has frequent ER presentations for medication refills. He states that he will get a ride home.   Stay Summary:  Cole Barrett is a 35y/o male. Patient presented as a transfer from [facility] this morning for psych evaluation after he was given his home dose of Risperdal 1mg . Patient presented voluntarily to with chief complaint of "something bad is going on tonight". Patient reports that he went to [facility] because he would like to speak with a counselor concerning the voices that he is hearing and to request his daily dose of Risperdal 1mg . Patient denies any stressor/triggers. Patient has had multiple ED visits to request daily dose of Risperdal 1mg .   Patient is He endorses ongoing auditory hallucination; he states "I'm hearing voices that are telling me they are going to kill me." He states that the voices are louder than usual and are interfering with his life. He reports that he takes his medication as prescribed. He reports "I have a bottle of my medication, Risperdal at home." Review of record shows that patient has had multiple/frequent visits to the ED (almost everyday) to request his daily dose of Risperdal.  Patient reports that he doesn't have a psychiatrist, and would  like to be seem by a Psychiatrist. He also wants to meet with a counselor to "talk to them about the voices, so they can help my situation."  Patient denies SI,HI, visual hallucination, paranoia, and access to guns. He endorses auditory hallucination. Patient states " the voices are telling me they are going to kill me." He reports that the voices are much louder than usual. He denies illicit drug use, he denies alcohol consumption. He reports that he lives at home alone. He reports that he works at a .   Patient was admitted for period of observation and restarted on medications. On assessment this morning, patient denies SI/HI/AVH and states that he feels better. Discussed  Open access hours at the [facility]. He was provided with 7 day samples and was advised to follow up during open access hours in order to establish care.   Total Time spent with patient: 15 minutes  Past Psychiatric History: schizoprenia Past Medical History:  Past Medical History:  Diagnosis Date  . COVID-19   . Homelessness   . Hypercholesterolemia   . Mental disorder   . Paranoid behavior (HCC)   . PE (pulmonary embolism)   . Schizophrenia (HCC)    History reviewed. No pertinent surgical history. Family History:  Family History  Problem Relation Age of Onset  . Hypertension Mother    Family Psychiatric History: see H&P Social History:  Social History   Substance and Sexual Activity  Alcohol Use Yes   Comment: BAC not available     Social  History   Substance and Sexual Activity  Drug Use Yes  . Types: Marijuana   Comment: Last used: 2 months ago UDS NA    Social History   Socioeconomic History  . Marital status: Single    Spouse name: Not on file  . Number of children: Not on file  . Years of education: Not on file  . Highest education level: Not on file  Occupational History  . Occupation: Employed    Comment: [employer]  Tobacco Use  . Smoking status: Current  Every Day Smoker    Packs/day: 0.50    Types: Cigarettes  . Smokeless tobacco: Never Used  Vaping Use  . Vaping Use: Never used  Substance and Sexual Activity  . Alcohol use: Yes    Comment: BAC not available  . Drug use: Yes    Types: Marijuana    Comment: Last used: 2 months ago UDS NA  . Sexual activity: Never  Other Topics Concern  . Not on file  Social History Narrative   ** Merged History Encounter **    [place] in [place].  He is employed.  He stated that he is [facility], but that he has not been there in a while.   Social Determinants of Health   Financial Resource Strain: Not on file  Food Insecurity: Not on file  Transportation Needs: Not on file  Physical Activity: Not on file  Stress: Not on file  Social Connections: Not on file   SDOH:  SDOH Screenings   Alcohol Screen: Not on file  Depression (PHQ2-9): Low Risk   . PHQ-2 Score: 0  Financial Resource Strain: Not on file  Food Insecurity: Not on file  Housing: Not on file  Physical Activity: Not on file  Social Connections: Not on file  Stress: Not on file  Tobacco Use: High Risk  . Smoking Tobacco Use: Current Every Day Smoker  . Smokeless Tobacco Use: Never Used  Transportation Needs: Not on file    Has this patient used any form of tobacco in the last 30 days? (Cigarettes, Smokeless Tobacco, Cigars, and/or Pipes) Prescription not provided because: n/a  Current Medications:  Current Facility-Administered Medications  Medication Dose Route Frequency Provider Last Rate Last Admin  . acetaminophen (TYLENOL) tablet 650 mg  650 mg Oral Q6H PRN Ajibola, Ene A, NP      . alum & mag hydroxide-simeth (MAALOX/MYLANTA) 200-200-20 MG/5ML suspension 30 mL  30 mL Oral Q4H PRN Ajibola, Ene A, NP      . hydrOXYzine (ATARAX/VISTARIL) tablet 25 mg  25 mg Oral TID PRN Ajibola, Ene A, NP      . magnesium hydroxide (MILK OF MAGNESIA) suspension 30 mL  30 mL Oral Daily PRN Ajibola, Ene A, NP      .  risperiDONE (RISPERDAL) tablet 1 mg  1 mg Oral QHS Berry, Jason A, NP      . traZODone (DESYREL) tablet 50 mg  50 mg Oral QHS PRN Ajibola, Ene A, NP       Current Outpatient Medications  Medication Sig Dispense Refill  . RISPERDAL 1 MG tablet Take 1 tablet (1 mg total) by mouth at bedtime. 5 tablet 0   Facility-Administered Medications Ordered in Other Encounters  Medication Dose Route Frequency Provider Last Rate Last Admin  . acetaminophen (TYLENOL) tablet 650 mg  650 mg Oral Q6H PRN Chales AbrahamsMills, Shnese E, NP      . alum & mag hydroxide-simeth (MAALOX/MYLANTA) 200-200-20 MG/5ML suspension  30 mL  30 mL Oral Q4H PRN Ophelia Shoulder E, NP      . magnesium hydroxide (MILK OF MAGNESIA) suspension 30 mL  30 mL Oral Daily PRN [name], NP        PTA Medications: (Not in a hospital admission)   Musculoskeletal  Strength & Muscle Tone: within normal limits Gait & Station: normal Patient leans: N/A  Psychiatric Specialty Exam  Presentation  General Appearance: Appropriate for Environment; Casual; Fairly Groomed  Eye Contact:Good  Speech:Clear and Coherent; Normal Rate  Speech Volume:Normal  Handedness:Right   Mood and Affect  Mood:Euthymic ("better")  Affect:Appropriate; Congruent   Thought Process  Thought Processes:Coherent; Goal Directed; Linear  Descriptions of Associations:Intact  Orientation:Full (Time, Place and Person)  Thought Content:WDL  Hallucinations:Hallucinations: None Description of Auditory Hallucinations: hearing loud voices that wants to kill him.  Ideas of Reference:None  Suicidal Thoughts:Suicidal Thoughts: No  Homicidal Thoughts:Homicidal Thoughts: No   Sensorium  Memory:Immediate Good; Recent Good; Remote Good  Judgment:Fair  Insight:Fair   Executive Functions  Concentration:Good  Attention Span:Good  Recall:Good  Fund of Knowledge:Good  Language:Good   Psychomotor Activity  Psychomotor Activity:Psychomotor Activity:  Normal   Assets  Assets:Communication Skills; Desire for Improvement; Housing; Resilience; Social Support; Vocational/Educational   Sleep  Sleep:Sleep: Fair   Physical Exam  Physical Exam Constitutional:      Appearance: Normal appearance. He is normal weight.  HENT:     Head: Normocephalic and atraumatic.  Eyes:     Extraocular Movements: Extraocular movements intact.  Pulmonary:     Effort: Pulmonary effort is normal.  Neurological:     Mental Status: He is alert.    Review of Systems  Constitutional: Negative for chills and fever.  Cardiovascular: Negative for chest pain.  Musculoskeletal: Negative for myalgias.  Neurological: Negative for headaches.  Psychiatric/Behavioral: Negative for depression, substance abuse and suicidal ideas.   Blood pressure 128/88, pulse 74, temperature 97.6 F (36.4 C), temperature source Oral, resp. rate 16, SpO2 100 %. There is no height or weight on file to calculate BMI.  Demographic Factors:  Male and Low socioeconomic status  Loss Factors: Financial problems/change in socioeconomic status  Historical Factors: NA  Risk Reduction Factors:   Positive social support  Continued Clinical Symptoms:  NA  Cognitive Features That Contribute To Risk:  None    Suicide Risk:  Minimal: No identifiable suicidal ideation.  Patients presenting with no risk factors but with morbid ruminations; may be classified as minimal risk based on the severity of the depressive symptoms  Plan Of Care/Follow-up recommendations:  Activity:  as tolerated Diet:  regular Other:     [facility] during walk in hours for appointment with psychiatrist for further medication management and for therapy.   Walk in hours are 8-11 AM Monday through Thursday for medication management.It is first come, first -serve; it is best to arrive by 7:30 AM. On Friday from 1 pm to 4 pm for therapy intake only. Please arrive  by 12:00 pm as it is  first come, first -serve.   When you arrive please go upstairs for your appointment. If you are unsure of where to go, inform the front desk that you are here for a walk in appointment and they will assist you with directions upstairs.  Address:  [address] in [place], [address] Ph: [phone]   Patient is instructed prior to discharge to: Take all medications as prescribed by his/her mental  healthcare provider. Report any adverse effects and or reactions from the medicines to his/her outpatient provider promptly. Patient has been instructed & cautioned: To not engage in alcohol and or illegal drug use while on prescription medicines. In the event of worsening symptoms, patient is instructed to call the crisis hotline, 911 and or go to the nearest ED for appropriate evaluation and treatment of symptoms. To follow-up with his/her primary care provider for your other medical issues, concerns and or health care needs.     Disposition: home/self care  Estella Husk, MD 01/12/2021, 8:55 AM

## 2021-01-13 ENCOUNTER — Emergency Department (HOSPITAL_COMMUNITY)
Admission: EM | Admit: 2021-01-13 | Discharge: 2021-01-13 | Disposition: A | Discharge status: Home / Self Care | Payer: Self-pay | Attending: Emergency Medicine | Admitting: Emergency Medicine

## 2021-01-13 DIAGNOSIS — Z8616 Personal history of COVID-19: Secondary | ICD-10-CM | POA: Insufficient documentation

## 2021-01-13 DIAGNOSIS — F209 Schizophrenia, unspecified: Secondary | ICD-10-CM | POA: Insufficient documentation

## 2021-01-13 DIAGNOSIS — F17210 Nicotine dependence, cigarettes, uncomplicated: Secondary | ICD-10-CM | POA: Insufficient documentation

## 2021-01-13 MED ORDER — RISPERIDONE 1 MG PO TABS
1.0000 mg | ORAL_TABLET | Freq: Once | ORAL | Status: AC
  Administered 2021-01-13: 1 mg via ORAL
  Filled 2021-01-13: qty 1

## 2021-01-13 NOTE — ED Provider Notes (Signed)
[facility] EMERGENCY DEPARTMENT Provider Note  CSN: [id number] Arrival date & time: 01/13/21 0117    History Chief Complaint  Patient presents with  . Medication Refill    HPI  Cole Barrett is a 35 y.o. male with history of homelessness and schizophrenia frequently comes to the ED during the night requesting Risperdal, states he 'was on this side of town and didn't have his medications with him'. He was at [facility] yesterday morning for same after an ED visit at [facility]. Denies any other current complaints.    Past Medical History:  Diagnosis Date  . COVID-19   . Homelessness   . Hypercholesterolemia   . Mental disorder   . Paranoid behavior (HCC)   . PE (pulmonary embolism)   . Schizophrenia (HCC)     No past surgical history on file.  Family History  Problem Relation Age of Onset  . Hypertension Mother     Social History   Tobacco Use  . Smoking status: Current Every Day Smoker    Packs/day: 0.50    Types: Cigarettes  . Smokeless tobacco: Never Used  Vaping Use  . Vaping Use: Never used  Substance Use Topics  . Alcohol use: Yes    Comment: BAC not available  . Drug use: Yes    Types: Marijuana    Comment: Last used: 2 months ago UDS NA     Home Medications Prior to Admission medications   Medication Sig Start Date End Date Taking? Authorizing Provider  RISPERDAL 1 MG tablet Take 1 tablet (1 mg total) by mouth at bedtime. 01/09/21   Long, Arlyss Repress, MD  amantadine (SYMMETREL) 100 MG capsule Take 1 capsule (100 mg total) by mouth 2 (two) times daily. Patient not taking: Reported on 11/04/2019 09/01/19 11/17/19  [name], NP     Allergies    Haldol [haloperidol]   Review of Systems   Review of Systems A comprehensive review of systems was completed and negative except as noted in HPI.    Physical Exam BP 126/82 (BP Location: Right Arm)   Pulse 75   Temp 97.6 F (36.4 C) (Oral)   Resp 17   SpO2 100%   Physical Exam Vitals and  nursing note reviewed.  HENT:     Head: Normocephalic.     Nose: Nose normal.  Eyes:     Extraocular Movements: Extraocular movements intact.  Pulmonary:     Effort: Pulmonary effort is normal.  Musculoskeletal:        General: Normal range of motion.     Cervical back: Neck supple.  Skin:    Findings: No rash (on exposed skin).  Neurological:     Mental Status: He is alert and oriented to person, place, and time.  Psychiatric:        Mood and Affect: Mood normal.        Behavior: Behavior normal.      ED Results / Procedures / Treatments   Labs (all labs ordered are listed, but only abnormal results are displayed) Labs Reviewed - No data to display  EKG None  Radiology No results found.  Procedures Procedures  Medications Ordered in the ED Medications  risperiDONE (RISPERDAL) tablet 1 mg (has no administration in time range)     MDM Rules/Calculators/A&P MDM Patient here for Risperdal, will give his morning dose. Encouraged to keep his medications with him at all times so he doesn't need to come to the ED.   ED Course  I have reviewed the triage vital signs and the nursing notes.  Pertinent labs & imaging results that were available during my care of the patient were reviewed by me and considered in my medical decision making (see chart for details).     Final Clinical Impression(s) / ED Diagnoses Final diagnoses:  Schizophrenia, unspecified type [facility])    Rx / DC Orders ED Discharge Orders    None       Pollyann Savoy, MD 01/13/21 [phone]

## 2021-01-13 NOTE — ED Triage Notes (Signed)
Pt here for nightly dose of risperdal. Denies SI/HI, NAD

## 2021-01-14 ENCOUNTER — Encounter (HOSPITAL_COMMUNITY): Payer: Self-pay

## 2021-01-14 ENCOUNTER — Emergency Department (HOSPITAL_COMMUNITY)
Admission: EM | Admit: 2021-01-14 | Discharge: 2021-01-14 | Disposition: A | Discharge status: Home / Self Care | Payer: Self-pay | Attending: Emergency Medicine | Admitting: Emergency Medicine

## 2021-01-14 ENCOUNTER — Other Ambulatory Visit: Payer: Self-pay

## 2021-01-14 DIAGNOSIS — Z5321 Procedure and treatment not carried out due to patient leaving prior to being seen by health care provider: Secondary | ICD-10-CM | POA: Insufficient documentation

## 2021-01-14 DIAGNOSIS — R443 Hallucinations, unspecified: Secondary | ICD-10-CM | POA: Insufficient documentation

## 2021-01-14 NOTE — ED Triage Notes (Signed)
Patient reports "the voices are getting really bad tonight and it is an emergency". Reports he needs a dose of Risperdal.

## 2021-01-14 NOTE — ED Notes (Signed)
Pt left the lobby at [facility]

## 2021-01-15 ENCOUNTER — Encounter (HOSPITAL_COMMUNITY): Payer: Self-pay | Admitting: Emergency Medicine

## 2021-01-15 ENCOUNTER — Other Ambulatory Visit: Payer: Self-pay

## 2021-01-15 ENCOUNTER — Emergency Department (HOSPITAL_COMMUNITY)
Admission: EM | Admit: 2021-01-15 | Discharge: 2021-01-15 | Disposition: A | Discharge status: Home / Self Care | Payer: Self-pay | Attending: Emergency Medicine | Admitting: Emergency Medicine

## 2021-01-15 DIAGNOSIS — F17210 Nicotine dependence, cigarettes, uncomplicated: Secondary | ICD-10-CM | POA: Insufficient documentation

## 2021-01-15 DIAGNOSIS — Z8616 Personal history of COVID-19: Secondary | ICD-10-CM | POA: Insufficient documentation

## 2021-01-15 DIAGNOSIS — Z760 Encounter for issue of repeat prescription: Secondary | ICD-10-CM | POA: Insufficient documentation

## 2021-01-15 NOTE — ED Triage Notes (Signed)
Pt reports he needs his nighttime dose of Risperdal.

## 2021-01-15 NOTE — ED Provider Notes (Signed)
[name] EMERGENCY DEPARTMENT Provider Note   CSN: [id number] Arrival date & time: 01/15/21  0016     History Chief Complaint  Patient presents with  . Medication Refill    Cole Barrett is a 35 y.o. male.  HPI    Patient presents for 73rd ER visit in 6 months.  He reports that his nighttime dose of medicine.  No other complaint Past Medical History:  Diagnosis Date  . COVID-19   . Homelessness   . Hypercholesterolemia   . Mental disorder   . Paranoid behavior (HCC)   . PE (pulmonary embolism)   . Schizophrenia [facility])     Patient Active Problem List   Diagnosis Date Noted  . Acute psychosis (HCC) 08/31/2019  . Schizophrenia (HCC) 08/31/2019  . Schizoaffective disorder, bipolar type (HCC) 01/19/2019  . Alcohol abuse   . Auditory hallucinations   . Alcohol abuse with alcohol-induced mood disorder (HCC) 09/08/2016  . Homelessness 09/03/2016  . Person feigning illness 09/03/2016  . 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 Cannabis use disorder, mild, abuse 08/29/2016  . Pulmonary emboli (HCC) 07/13/2016  . Adjustment disorder with emotional disturbance 03/12/2014  . 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 Acute left flank pain 01/02/2014  . Pulmonary embolism, bilateral (HCC) 01/02/2014    History reviewed. No pertinent surgical history.     Family History  Problem Relation Age of Onset  . Hypertension Mother     Social History   Tobacco Use  . Smoking status: Current Every Day Smoker    Packs/day: 0.50    Types: Cigarettes  . Smokeless tobacco: Never Used  Vaping Use  . Vaping Use: Never used  Substance Use Topics  . Alcohol use: Yes    Comment: BAC not available  . Drug use: Yes    Types: Marijuana    Comment: Last used: 2 months ago UDS NA    Home Medications Prior to Admission medications   Medication Sig Start Date End Date Taking? Authorizing Provider  RISPERDAL 1 MG tablet Take 1 tablet  (1 mg total) by mouth at bedtime. 01/09/21   Long, Arlyss Repress, MD  amantadine (SYMMETREL) 100 MG capsule Take 1 capsule (100 mg total) by mouth 2 (two) times daily. Patient not taking: Reported on 11/04/2019 09/01/19 11/17/19  [name], NP    Allergies    Haldol [haloperidol]  Review of Systems   Review of Systems  Constitutional: Negative for fever.    Physical Exam Updated Vital Signs BP (!) 119/56 (BP Location: Left Arm)   Pulse (!) 104   Temp 98.2 F (36.8 C) (Oral)   Resp 16   SpO2 96%   Physical Exam  CONSTITUTIONAL: Disheveled, sleeping and snoring loudly HEAD: Normocephalic/atraumatic EYES: EOMI ENMT: Mucous membranes moist NECK: supple no meningeal signs LUNGS:  no apparent distress NEURO: Pt is sleeping but easily aroused  EXTREMITIES:  full ROM SKIN: warm, color normal  ED Results / Procedures / Treatments   Labs (all labs ordered are listed, but only abnormal results are displayed) Labs Reviewed - No data to display  EKG None  Radiology No results found.  Procedures Procedures   Medications Ordered in ED Medications - No data to display  ED Course  I have reviewed the triage vital signs and the nursing notes.     MDM Rules/Calculators/A&P  Final Clinical Impression(s) / ED Diagnoses Final diagnoses:  Encounter for medication refill    Rx / DC Orders ED Discharge Orders    None       Zadie Rhine, MD 01/15/21 [phone]

## 2021-01-16 ENCOUNTER — Other Ambulatory Visit: Payer: Self-pay

## 2021-01-16 ENCOUNTER — Encounter (HOSPITAL_COMMUNITY): Payer: Self-pay | Admitting: Emergency Medicine

## 2021-01-16 ENCOUNTER — Emergency Department (HOSPITAL_COMMUNITY)
Admission: EM | Admit: 2021-01-16 | Discharge: 2021-01-16 | Disposition: A | Discharge status: Home / Self Care | Payer: Self-pay | Attending: Emergency Medicine | Admitting: Emergency Medicine

## 2021-01-16 DIAGNOSIS — F17210 Nicotine dependence, cigarettes, uncomplicated: Secondary | ICD-10-CM | POA: Insufficient documentation

## 2021-01-16 DIAGNOSIS — Z8616 Personal history of COVID-19: Secondary | ICD-10-CM | POA: Insufficient documentation

## 2021-01-16 DIAGNOSIS — Z760 Encounter for issue of repeat prescription: Secondary | ICD-10-CM | POA: Insufficient documentation

## 2021-01-16 MED ORDER — RISPERIDONE 1 MG PO TABS
1.0000 mg | ORAL_TABLET | Freq: Once | ORAL | Status: AC
  Administered 2021-01-16: 1 mg via ORAL
  Filled 2021-01-16: qty 1

## 2021-01-16 NOTE — ED Provider Notes (Signed)
[facility]-EMERGENCY DEPT Provider Note   CSN: [id number] Arrival date & time: 01/16/21  0004     History Chief Complaint  Patient presents with  . Medication Refill    Cole Barrett is a 35 y.o. male.   Medication Refill Medications/supplies requested:  Risperidone Reason for request:  Prescriptions lost Medications taken before: yes - see home medications   Patient has complete original prescription information: yes        Past Medical History:  Diagnosis Date  . COVID-19   . Homelessness   . Hypercholesterolemia   . Mental disorder   . Paranoid behavior (HCC)   . PE (pulmonary embolism)   . Schizophrenia [facility])     Patient Active Problem List   Diagnosis Date Noted  . Acute psychosis (HCC) 08/31/2019  . Schizophrenia (HCC) 08/31/2019  . Schizoaffective disorder, bipolar type (HCC) 01/19/2019  . Alcohol abuse   . Auditory hallucinations   . Alcohol abuse with alcohol-induced mood disorder (HCC) 09/08/2016  . Homelessness 09/03/2016  . Person feigning illness 09/03/2016  . 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 Cannabis use disorder, mild, abuse 08/29/2016  . Pulmonary emboli (HCC) 07/13/2016  . Adjustment disorder with emotional disturbance 03/12/2014  . 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 Acute left flank pain 01/02/2014  . Pulmonary embolism, bilateral (HCC) 01/02/2014    History reviewed. No pertinent surgical history.     Family History  Problem Relation Age of Onset  . Hypertension Mother     Social History   Tobacco Use  . Smoking status: Current Every Day Smoker    Packs/day: 0.50    Types: Cigarettes  . Smokeless tobacco: Never Used  Vaping Use  . Vaping Use: Never used  Substance Use Topics  . Alcohol use: Yes    Comment: BAC not available  . Drug use: Yes    Types: Marijuana    Comment: Last used: 2 months ago UDS NA    Home Medications Prior to Admission medications    Medication Sig Start Date End Date Taking? Authorizing Provider  RISPERDAL 1 MG tablet Take 1 tablet (1 mg total) by mouth at bedtime. 01/09/21   Long, Arlyss Repress, MD  amantadine (SYMMETREL) 100 MG capsule Take 1 capsule (100 mg total) by mouth 2 (two) times daily. Patient not taking: Reported on 11/04/2019 09/01/19 11/17/19  [name], NP    Allergies    Haldol [haloperidol]  Review of Systems   Review of Systems  Constitutional: Negative for chills and fever.  HENT: Negative for congestion and rhinorrhea.   Respiratory: Negative for cough and shortness of breath.   Cardiovascular: Negative for chest pain and palpitations.  Gastrointestinal: Negative for diarrhea, nausea and vomiting.  Genitourinary: Negative for difficulty urinating and dysuria.  Musculoskeletal: Negative for arthralgias and back pain.  Skin: Negative for color change and rash.  Neurological: Negative for light-headedness and headaches.    Physical Exam Updated Vital Signs BP (!) 141/70 (BP Location: Left Arm)   Pulse 68   Temp 98.1 F (36.7 C) (Oral)   Resp 18   Ht 5\' 10"  (1.778 m)   Wt 99.8 kg   SpO2 99%   BMI 31.57 kg/m   Physical Exam Vitals and nursing note reviewed.  Constitutional:      General: He is not in acute distress.    Appearance: Normal appearance.  HENT:     Head: Normocephalic and atraumatic.     Nose: No  rhinorrhea.  Eyes:     General:        Right eye: No discharge.        Left eye: No discharge.     Conjunctiva/sclera: Conjunctivae normal.  Cardiovascular:     Rate and Rhythm: Normal rate and regular rhythm.  Pulmonary:     Effort: Pulmonary effort is normal.     Breath sounds: No stridor.  Abdominal:     General: Abdomen is flat. There is no distension.     Palpations: Abdomen is soft.  Musculoskeletal:        General: No deformity or signs of injury.  Skin:    General: Skin is warm and dry.  Neurological:     General: No focal deficit present.     Mental  Status: He is alert. Mental status is at baseline.     Motor: No weakness.  Psychiatric:        Mood and Affect: Mood normal.        Behavior: Behavior normal.        Thought Content: Thought content normal.     ED Results / Procedures / Treatments   Labs (all labs ordered are listed, but only abnormal results are displayed) Labs Reviewed - No data to display  EKG None  Radiology No results found.  Procedures Procedures   Medications Ordered in ED Medications  risperiDONE (RISPERDAL) tablet 1 mg (has no administration in time range)    ED Course  I have reviewed the triage vital signs and the nursing notes.  Pertinent labs & imaging results that were available during my care of the patient were reviewed by me and considered in my medical decision making (see chart for details).    MDM Rules/Calculators/A&P                          Needs one time med refill, has RX but left it at home. No complaints otherwise, no recent injury or illness.  Patient has multiple visits for the same.  He is discharged after one-time dose. Final Clinical Impression(s) / ED Diagnoses Final diagnoses:  Medication refill    Rx / DC Orders ED Discharge Orders    None       Sabino Donovan, MD 01/16/21 [phone]

## 2021-01-16 NOTE — ED Triage Notes (Signed)
Patient requesting to have a dose of his Risperdal tonight because he left it.

## 2021-01-17 ENCOUNTER — Other Ambulatory Visit: Payer: Self-pay

## 2021-01-17 ENCOUNTER — Emergency Department (HOSPITAL_COMMUNITY)
Admission: EM | Admit: 2021-01-17 | Discharge: 2021-01-17 | Disposition: A | Discharge status: Home / Self Care | Payer: Self-pay | Attending: Emergency Medicine | Admitting: Emergency Medicine

## 2021-01-17 DIAGNOSIS — R440 Auditory hallucinations: Secondary | ICD-10-CM | POA: Insufficient documentation

## 2021-01-17 DIAGNOSIS — Z5321 Procedure and treatment not carried out due to patient leaving prior to being seen by health care provider: Secondary | ICD-10-CM | POA: Insufficient documentation

## 2021-01-17 DIAGNOSIS — Y906 Blood alcohol level of 120-199 mg/100 ml: Secondary | ICD-10-CM | POA: Insufficient documentation

## 2021-01-17 DIAGNOSIS — F1010 Alcohol abuse, uncomplicated: Secondary | ICD-10-CM | POA: Insufficient documentation

## 2021-01-17 LAB — COMPREHENSIVE METABOLIC PANEL
ALT: 30 U/L (ref 0–44)
AST: 27 U/L (ref 15–41)
Albumin: 4 g/dL (ref 3.5–5.0)
Alkaline Phosphatase: 50 U/L (ref 38–126)
Anion gap: 12 (ref 5–15)
BUN: 8 mg/dL (ref 6–20)
CO2: 21 mmol/L — ABNORMAL LOW (ref 22–32)
Calcium: 8.8 mg/dL — ABNORMAL LOW (ref 8.9–10.3)
Chloride: 105 mmol/L (ref 98–111)
Creatinine, Ser: 1.02 mg/dL (ref 0.61–1.24)
GFR, Estimated: >60 mL/min (ref >60)
Glucose, Bld: 107 mg/dL — ABNORMAL HIGH (ref 70–99)
Potassium: 3.6 mmol/L (ref 3.5–5.1)
Sodium: 138 mmol/L (ref 135–145)
Total Bilirubin: 0.9 mg/dL (ref 0.3–1.2)
Total Protein: 7 g/dL (ref 6.5–8.1)

## 2021-01-17 LAB — CBC
HCT: 41.1 % (ref 39.0–52.0)
Hemoglobin: 13.1 g/dL (ref 13.0–17.0)
MCH: 27.8 pg (ref 26.0–34.0)
MCHC: 31.9 g/dL (ref 30.0–36.0)
MCV: 87.3 fL (ref 80.0–100.0)
Platelets: 287 10*3/uL (ref 150–400)
RBC: 4.71 MIL/uL (ref 4.22–5.81)
RDW: 12.4 % (ref 11.5–15.5)
WBC: 5.5 10*3/uL (ref 4.0–10.5)
nRBC: 0 % (ref 0.0–0.2)

## 2021-01-17 LAB — ETHANOL: Alcohol, Ethyl (B): 145 mg/dL — ABNORMAL HIGH (ref <10)

## 2021-01-17 NOTE — ED Notes (Signed)
Pt left without being seen.

## 2021-01-17 NOTE — ED Triage Notes (Signed)
Ambulatory to triage, reports he is hearing voices telling him they will hurt him. Admits to drinking ETOH (wine) tonight. Says he has taken his medications tonight.

## 2021-01-18 ENCOUNTER — Encounter (HOSPITAL_COMMUNITY): Payer: Self-pay | Admitting: Emergency Medicine

## 2021-01-18 ENCOUNTER — Other Ambulatory Visit: Payer: Self-pay

## 2021-01-18 ENCOUNTER — Emergency Department (HOSPITAL_COMMUNITY)
Admission: EM | Admit: 2021-01-18 | Discharge: 2021-01-18 | Disposition: A | Discharge status: Home / Self Care | Payer: Self-pay | Attending: Emergency Medicine | Admitting: Emergency Medicine

## 2021-01-18 DIAGNOSIS — Z1330 Encounter for screening examination for mental health and behavioral disorders, unspecified: Secondary | ICD-10-CM | POA: Insufficient documentation

## 2021-01-18 DIAGNOSIS — Z8616 Personal history of COVID-19: Secondary | ICD-10-CM | POA: Insufficient documentation

## 2021-01-18 DIAGNOSIS — F17210 Nicotine dependence, cigarettes, uncomplicated: Secondary | ICD-10-CM | POA: Insufficient documentation

## 2021-01-18 NOTE — ED Provider Notes (Signed)
[facility] EMERGENCY DEPARTMENT Provider Note   CSN: [id number] Arrival date & time: 01/18/21  0019     History Chief Complaint  Patient presents with  . "talk to psy"    Cole Barrett is a 35 y.o. male.   Mental Health Problem Presenting symptoms: hallucinations   Presenting symptoms: no aggressive behavior, no delusions, no homicidal ideas, no self-mutilation, no suicidal thoughts, no suicidal threats and no suicide attempt   Degree of incapacity (severity):  Moderate Onset quality:  Gradual Timing:  Constant Progression:  Waxing and waning Chronicity:  Chronic Treatment compliance:  Some of the time Relieved by:  Nothing Worsened by:  Nothing Ineffective treatments:  None tried Associated symptoms: no chest pain and no headaches        Past Medical History:  Diagnosis Date  . COVID-19   . Homelessness   . Hypercholesterolemia   . Mental disorder   . Paranoid behavior (HCC)   . PE (pulmonary embolism)   . Schizophrenia [facility])     Patient Active Problem List   Diagnosis Date Noted  . Acute psychosis (HCC) 08/31/2019  . Schizophrenia (HCC) 08/31/2019  . Schizoaffective disorder, bipolar type (HCC) 01/19/2019  . Alcohol abuse   . Auditory hallucinations   . Alcohol abuse with alcohol-induced mood disorder (HCC) 09/08/2016  . Homelessness 09/03/2016  . Person feigning illness 09/03/2016  . 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 Cannabis use disorder, mild, abuse 08/29/2016  . Pulmonary emboli (HCC) 07/13/2016  . Adjustment disorder with emotional disturbance 03/12/2014  . 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 Acute left flank pain 01/02/2014  . Pulmonary embolism, bilateral (HCC) 01/02/2014    History reviewed. No pertinent surgical history.     Family History  Problem Relation Age of Onset  . Hypertension Mother     Social History   Tobacco Use  . Smoking status: Current Every Day Smoker     Packs/day: 0.50    Types: Cigarettes  . Smokeless tobacco: Never Used  Vaping Use  . Vaping Use: Never used  Substance Use Topics  . Alcohol use: Yes    Comment: BAC not available  . Drug use: Yes    Types: Marijuana    Comment: Last used: 2 months ago UDS NA    Home Medications Prior to Admission medications   Medication Sig Start Date End Date Taking? Authorizing Provider  RISPERDAL 1 MG tablet Take 1 tablet (1 mg total) by mouth at bedtime. 01/09/21   Long, Arlyss Repress, MD  amantadine (SYMMETREL) 100 MG capsule Take 1 capsule (100 mg total) by mouth 2 (two) times daily. Patient not taking: Reported on 11/04/2019 09/01/19 11/17/19  [name], NP    Allergies    Haldol [haloperidol]  Review of Systems   Review of Systems  Constitutional: Negative for chills and fever.  HENT: Negative for congestion and rhinorrhea.   Respiratory: Negative for cough and shortness of breath.   Cardiovascular: Negative for chest pain and palpitations.  Gastrointestinal: Negative for diarrhea, nausea and vomiting.  Genitourinary: Negative for difficulty urinating and dysuria.  Musculoskeletal: Negative for arthralgias and back pain.  Skin: Negative for color change and rash.  Neurological: Negative for light-headedness and headaches.  Psychiatric/Behavioral: Positive for hallucinations. Negative for homicidal ideas, self-injury and suicidal ideas.    Physical Exam Updated Vital Signs BP 114/66 (BP Location: Right Arm)   Pulse 74   Temp 97.6 F (36.4 C) (Oral)   Resp 18  SpO2 97%   Physical Exam Vitals and nursing note reviewed. Exam conducted with a chaperone present.  Constitutional:      General: He is not in acute distress.    Appearance: Normal appearance.  HENT:     Head: Normocephalic and atraumatic.     Nose: No rhinorrhea.  Eyes:     General:        Right eye: No discharge.        Left eye: No discharge.     Conjunctiva/sclera: Conjunctivae normal.  Cardiovascular:      Rate and Rhythm: Normal rate and regular rhythm.  Pulmonary:     Effort: Pulmonary effort is normal.     Breath sounds: No stridor.  Abdominal:     General: Abdomen is flat. There is no distension.     Palpations: Abdomen is soft.  Musculoskeletal:        General: No deformity or signs of injury.  Skin:    General: Skin is warm and dry.  Neurological:     General: No focal deficit present.     Mental Status: He is alert. Mental status is at baseline.     Motor: No weakness.  Psychiatric:        Attention and Perception: Attention normal.        Mood and Affect: Mood normal.        Speech: Speech normal.        Behavior: Behavior normal. Behavior is cooperative.        Thought Content: Thought content normal. Thought content is not paranoid or delusional. Thought content does not include homicidal or suicidal ideation. Thought content does not include homicidal or suicidal plan.     ED Results / Procedures / Treatments   Labs (all labs ordered are listed, but only abnormal results are displayed) Labs Reviewed - No data to display  EKG None  Radiology No results found.  Procedures Procedures   Medications Ordered in ED Medications - No data to display  ED Course  I have reviewed the triage vital signs and the nursing notes.  Pertinent labs & imaging results that were available during my care of the patient were reviewed by me and considered in my medical decision making (see chart for details).    MDM Rules/Calculators/A&P                          Patient with history of mental health disorder intermittently compliant with medications comes today with worsening hallucinations.  Denies any command hallucinations to harm himself or others.  States he is hearing voices.  He does not feel he needs to be hospitalized.  He feels that he just needs to speak to provider to set up counseling as he is trying to improve his mental status.  He feels safe with himself.  He is  given information for behavioral health urgent care second follow-up as needed for mental health concerns.  He is invited to come back at any point for reevaluation.  No signs of injury no signs of illness. Final Clinical Impression(s) / ED Diagnoses Final diagnoses:  Encounter for screening examination for mental health and behavioral disorders    Rx / DC Orders ED Discharge Orders    None       Sabino Donovan, MD 01/18/21 [phone]

## 2021-01-18 NOTE — Discharge Instructions (Addendum)
Follow-up at the behavioral health urgent care for any needs related to behavioral health.  They are open 24 hours a day and you can show up on every day.  Continue to take your prescribed medications.

## 2021-01-18 NOTE — ED Notes (Signed)
Discharge papers discussed with pt caregiver. Discussed s/sx to return, follow up with PCP, medications given/next dose due. Caregiver verbalized understanding.  ?

## 2021-01-18 NOTE — ED Triage Notes (Signed)
Pt states he wants "to talk to psy.... my meds are not working.."  Denies S/I or H/i.  No other issues at this time.

## 2021-01-19 ENCOUNTER — Encounter (HOSPITAL_COMMUNITY): Payer: Self-pay

## 2021-01-19 ENCOUNTER — Emergency Department (HOSPITAL_COMMUNITY)
Admission: EM | Admit: 2021-01-19 | Discharge: 2021-01-19 | Disposition: A | Discharge status: Home / Self Care | Payer: Self-pay | Attending: Emergency Medicine | Admitting: Emergency Medicine

## 2021-01-19 ENCOUNTER — Other Ambulatory Visit: Payer: Self-pay

## 2021-01-19 DIAGNOSIS — Z8616 Personal history of COVID-19: Secondary | ICD-10-CM | POA: Insufficient documentation

## 2021-01-19 DIAGNOSIS — F17210 Nicotine dependence, cigarettes, uncomplicated: Secondary | ICD-10-CM | POA: Insufficient documentation

## 2021-01-19 DIAGNOSIS — R443 Hallucinations, unspecified: Secondary | ICD-10-CM | POA: Insufficient documentation

## 2021-01-19 DIAGNOSIS — F209 Schizophrenia, unspecified: Secondary | ICD-10-CM | POA: Insufficient documentation

## 2021-01-19 NOTE — ED Notes (Addendum)
Pt stated that he is here to figure out how he gets a medicaid card so he can go to behavioral health so he can get some medicine.

## 2021-01-19 NOTE — Discharge Instructions (Signed)
Follow up with your mental health provider.  Return for concerns for self harm or if you think you might harm someone else

## 2021-01-19 NOTE — ED Provider Notes (Signed)
[name] EMERGENCY DEPARTMENT Provider Note   CSN: [id number] Arrival date & time: 01/19/21  0114     History No chief complaint on file.   Davan Nawabi is a 35 y.o. male.  35 yo M with a chief complaints of hallucinations.  The patient was actually seen for this yesterday in the emergency department.  He realized that he does not have Medicaid and was trying to figure out how to see Dr. In the office and found to get his prescriptions filled.  He said come back here today to see social work to see if they could help him with the process.  He denies any worsening hallucinations denies homicidal or suicidal ideation.  The history is provided by the patient.  Illness Severity:  Moderate Onset quality:  Gradual Duration:  2 months Timing:  Constant Progression:  Worsening Chronicity:  Chronic Associated symptoms: no abdominal pain, no chest pain, no congestion, no diarrhea, no fever, no headaches, no myalgias, no rash, no shortness of breath and no vomiting        Past Medical History:  Diagnosis Date  . COVID-19   . Homelessness   . Hypercholesterolemia   . Mental disorder   . Paranoid behavior (HCC)   . PE (pulmonary embolism)   . Schizophrenia [facility])     Patient Active Problem List   Diagnosis Date Noted  . Acute psychosis (HCC) 08/31/2019  . Schizophrenia (HCC) 08/31/2019  . Schizoaffective disorder, bipolar type (HCC) 01/19/2019  . Alcohol abuse   . Auditory hallucinations   . Alcohol abuse with alcohol-induced mood disorder (HCC) 09/08/2016  . Homelessness 09/03/2016  . Person feigning illness 09/03/2016  . 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 Cannabis use disorder, mild, abuse 08/29/2016  . Pulmonary emboli (HCC) 07/13/2016  . Adjustment disorder with emotional disturbance 03/12/2014  . 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 Acute left flank pain 01/02/2014  . Pulmonary embolism, bilateral (HCC)  01/02/2014    History reviewed. No pertinent surgical history.     Family History  Problem Relation Age of Onset  . Hypertension Mother     Social History   Tobacco Use  . Smoking status: Current Every Day Smoker    Packs/day: 0.50    Types: Cigarettes  . Smokeless tobacco: Never Used  Vaping Use  . Vaping Use: Never used  Substance Use Topics  . Alcohol use: Yes    Comment: BAC not available  . Drug use: Yes    Types: Marijuana    Comment: Last used: 2 months ago UDS NA    Home Medications Prior to Admission medications   Medication Sig Start Date End Date Taking? Authorizing Provider  RISPERDAL 1 MG tablet Take 1 tablet (1 mg total) by mouth at bedtime. 01/09/21   Long, Arlyss Repress, MD  amantadine (SYMMETREL) 100 MG capsule Take 1 capsule (100 mg total) by mouth 2 (two) times daily. Patient not taking: Reported on 11/04/2019 09/01/19 11/17/19  [name], NP    Allergies    Haldol [haloperidol]  Review of Systems   Review of Systems  Constitutional: Negative for chills and fever.  HENT: Negative for congestion and facial swelling.   Eyes: Negative for discharge and visual disturbance.  Respiratory: Negative for shortness of breath.   Cardiovascular: Negative for chest pain and palpitations.  Gastrointestinal: Negative for abdominal pain, diarrhea and vomiting.  Musculoskeletal: Negative for arthralgias and myalgias.  Skin: Negative for color change and rash.  Neurological: Negative for tremors, syncope and headaches.  Psychiatric/Behavioral: Negative for confusion and dysphoric mood.    Physical Exam Updated Vital Signs BP 117/76 (BP Location: Right Arm)   Pulse 71   Temp 98 F (36.7 C) (Oral)   Resp 18   SpO2 95%   Physical Exam Vitals and nursing note reviewed.  Constitutional:      Appearance: He is well-developed and well-nourished.  HENT:     Head: Normocephalic and atraumatic.     Mouth/Throat:     Mouth: Mucous membranes are moist.  Eyes:      Extraocular Movements: EOM normal.     Pupils: Pupils are equal, round, and reactive to light.  Neck:     Vascular: No JVD.  Cardiovascular:     Rate and Rhythm: Normal rate.  Pulmonary:     Effort: Pulmonary effort is normal. No respiratory distress.  Abdominal:     General: There is no distension.  Musculoskeletal:        General: Normal range of motion.     Cervical back: Normal range of motion and neck supple.  Skin:    Coloration: Skin is not pale.     Findings: No rash.  Neurological:     Mental Status: He is alert and oriented to person, place, and time.  Psychiatric:        Mood and Affect: Mood and affect normal.        Behavior: Behavior normal.     ED Results / Procedures / Treatments   Labs (all labs ordered are listed, but only abnormal results are displayed) Labs Reviewed - No data to display  EKG None  Radiology No results found.  Procedures Procedures   Medications Ordered in ED Medications - No data to display  ED Course  I have reviewed the triage vital signs and the nursing notes.  Pertinent labs & imaging results that were available during my care of the patient were reviewed by me and considered in my medical decision making (see chart for details).    MDM Rules/Calculators/A&P                          35 yo M with a history of mental illness comes in with a chief complaints of wanting to have help filling out information to have government assistance.  He has no other complaints.  We will have social work eval.  8:53 AM:  I have discussed the diagnosis/risks/treatment options with the patient and believe the pt to be eligible for discharge home to follow-up with PCP. We also discussed returning to the ED immediately if new or worsening sx occur. We discussed the sx which are most concerning (e.g., sudden worsening pain, fever, inability to tolerate by mouth, SI, HI) that necessitate immediate return. Medications administered to the  patient during their visit and any new prescriptions provided to the patient are listed below.  Medications given during this visit Medications - No data to display   The patient appears reasonably screen and/or stabilized for discharge and I doubt any other medical condition or other [facility] requiring further screening, evaluation, or treatment in the ED at this time prior to discharge.   Final Clinical Impression(s) / ED Diagnoses Final diagnoses:  Hallucinations    Rx / DC Orders ED Discharge Orders    None       Melene Plan, DO 01/19/21 [phone]

## 2021-01-19 NOTE — ED Triage Notes (Signed)
Pt requesting nightly dose of Risperdal.

## 2021-01-19 NOTE — ED Triage Notes (Signed)
Pt states that he wants to ask the doctor a question about something he told him to do so he wouldn't have to come here anymore for meds. Does not need meds today, no SI/HI

## 2021-01-20 ENCOUNTER — Emergency Department (HOSPITAL_COMMUNITY)
Admission: EM | Admit: 2021-01-20 | Discharge: 2021-01-20 | Disposition: A | Discharge status: Home / Self Care | Payer: Self-pay | Attending: Emergency Medicine | Admitting: Emergency Medicine

## 2021-01-20 DIAGNOSIS — Z760 Encounter for issue of repeat prescription: Secondary | ICD-10-CM

## 2021-01-20 MED ORDER — RISPERIDONE 1 MG PO TABS
1.0000 mg | ORAL_TABLET | Freq: Once | ORAL | Status: AC
  Administered 2021-01-20: 1 mg via ORAL
  Filled 2021-01-20: qty 1

## 2021-01-20 NOTE — ED Provider Notes (Signed)
[facility]-EMERGENCY DEPT Provider Note   CSN: [id number] Arrival date & time: 01/19/21  2251     History Chief Complaint  Patient presents with  . Medication Refill    Cole Barrett is a 35 y.o. male with a history of schizophrenia, COVID-19, homelessness who is well-known to this emergency department with 78 visits in the last 6 months.  Patient has had almost daily visits for weeks.  He typically presents requesting a dose of his home Risperdal.  Today, he is requesting his dose of his home Risperdal as he says that he walked a long way to visit a friend and he forgot his home medication.  He denies SI, HI, or auditory visual hallucinations.  Yesterday, the patient was seen in the emergency department requesting to speak to social work as he no longer had active Medicaid.  He states that they were able to help him with his Medicaid application and he no longer has any concerns regarding his Medicaid at this time.  No fever, chills, vomiting, chest pain, shortness of breath, rash, headache, dizziness, lightheadedness, or other complaints at this time.  The history is provided by the patient and medical records. No language interpreter was used.  Medication Refill Reason for request:  Medications not available Medications taken before: yes - see home medications        Past Medical History:  Diagnosis Date  . COVID-19   . Homelessness   . Hypercholesterolemia   . Mental disorder   . Paranoid behavior (HCC)   . PE (pulmonary embolism)   . Schizophrenia [facility])     Patient Active Problem List   Diagnosis Date Noted  . Acute psychosis (HCC) 08/31/2019  . Schizophrenia (HCC) 08/31/2019  . Schizoaffective disorder, bipolar type (HCC) 01/19/2019  . Alcohol abuse   . Auditory hallucinations   . Alcohol abuse with alcohol-induced mood disorder (HCC) 09/08/2016  . Homelessness 09/03/2016  . Person feigning illness 09/03/2016  . Brief psychotic  disorder (HCC) 08/29/2016  . Cannabis use disorder, mild, abuse 08/29/2016  . Pulmonary emboli (HCC) 07/13/2016  . Adjustment disorder with emotional disturbance 03/12/2014  . Pulmonary embolism and infarction (HCC) 01/02/2014  . Acute left flank pain 01/02/2014  . Pulmonary embolism, bilateral (HCC) 01/02/2014    History reviewed. No pertinent surgical history.     Family History  Problem Relation Age of Onset  . Hypertension Mother     Social History   Tobacco Use  . Smoking status: Current Every Day Smoker    Packs/day: 0.50    Types: Cigarettes  . Smokeless tobacco: Never Used  Vaping Use  . Vaping Use: Never used  Substance Use Topics  . Alcohol use: Yes    Comment: BAC not available  . Drug use: Yes    Types: Marijuana    Comment: Last used: 2 months ago UDS NA    Home Medications Prior to Admission medications   Medication Sig Start Date End Date Taking? Authorizing Provider  RISPERDAL 1 MG tablet Take 1 tablet (1 mg total) by mouth at bedtime. 01/09/21   Long, Arlyss Repress, MD  amantadine (SYMMETREL) 100 MG capsule Take 1 capsule (100 mg total) by mouth 2 (two) times daily. Patient not taking: Reported on 11/04/2019 09/01/19 11/17/19  [name], NP    Allergies    Haldol [haloperidol]  Review of Systems   Review of Systems  Constitutional: Negative for activity change.  Respiratory: Negative for shortness of breath.  Cardiovascular: Negative for chest pain.  Gastrointestinal: Negative for abdominal pain.  Musculoskeletal: Negative for back pain.  Skin: Negative for rash.  Psychiatric/Behavioral: Negative for hallucinations and sleep disturbance.    Physical Exam Updated Vital Signs BP 119/64   Pulse 83   Temp 98.1 F (36.7 C)   Resp 16   SpO2 97%   Physical Exam Vitals and nursing note reviewed.  Constitutional:      Appearance: He is well-developed.     Comments: Disheveled  HENT:     Head: Normocephalic.  Eyes:      Conjunctiva/sclera: Conjunctivae normal.  Cardiovascular:     Rate and Rhythm: Normal rate and regular rhythm.     Heart sounds: No murmur heard.   Pulmonary:     Effort: Pulmonary effort is normal.  Abdominal:     General: There is no distension.     Palpations: Abdomen is soft.  Musculoskeletal:     Cervical back: Neck supple.  Skin:    General: Skin is warm and dry.  Neurological:     Mental Status: He is alert.     Comments: Alert and oriented x3  Psychiatric:        Attention and Perception: He does not perceive auditory or visual hallucinations.        Behavior: Behavior normal.        Thought Content: Thought content does not include homicidal or suicidal ideation. Thought content does not include homicidal or suicidal plan.     ED Results / Procedures / Treatments   Labs (all labs ordered are listed, but only abnormal results are displayed) Labs Reviewed - No data to display  EKG None  Radiology No results found.  Procedures Procedures   Medications Ordered in ED Medications  risperiDONE (RISPERDAL) tablet 1 mg (has no administration in time range)    ED Course  I have reviewed the triage vital signs and the nursing notes.  Pertinent labs & imaging results that were available during my care of the patient were reviewed by me and considered in my medical decision making (see chart for details).    MDM Rules/Calculators/A&P                          35 year old male with a history of schizophrenia, COVID-19, homelessness who presents to the ER requesting a dose of his home Risperdal.  The patient is well-known to this emergency department with 78 visits over the last 6 months.  He denies SI, HI, or auditory visual loose [place].  He has no other concerns at this time.  We will refer the patient to [facility] health and wellness for follow-up.  ER return precautions given.  He is hemodynamically stable no acute distress.  Safe for discharge home with outpatient  follow-up to [facility] health and wellness.  Final Clinical Impression(s) / ED Diagnoses Final diagnoses:  Medication refill    Rx / DC Orders ED Discharge Orders    None       Barkley Boards, PA-C 01/20/21 0631    [name], MD 01/20/21 [phone]

## 2021-01-20 NOTE — Discharge Instructions (Addendum)
Thank you for allowing me to care for you today in the Emergency Department.   You were given your nighttime dose of your Risperdal.  You can follow-up with [place] and wellness if you need help with your prescriptions until your Medicaid is active.  Return to the emergency department for new or worsening symptoms.

## 2021-01-21 ENCOUNTER — Encounter (HOSPITAL_COMMUNITY): Payer: Self-pay

## 2021-01-21 ENCOUNTER — Other Ambulatory Visit: Payer: Self-pay

## 2021-01-21 ENCOUNTER — Emergency Department (HOSPITAL_COMMUNITY)
Admission: EM | Admit: 2021-01-21 | Discharge: 2021-01-21 | Disposition: A | Discharge status: Home / Self Care | Payer: Self-pay | Attending: Emergency Medicine | Admitting: Emergency Medicine

## 2021-01-21 DIAGNOSIS — Z79899 Other long term (current) drug therapy: Secondary | ICD-10-CM | POA: Insufficient documentation

## 2021-01-21 DIAGNOSIS — F209 Schizophrenia, unspecified: Secondary | ICD-10-CM | POA: Insufficient documentation

## 2021-01-21 DIAGNOSIS — Z760 Encounter for issue of repeat prescription: Secondary | ICD-10-CM | POA: Insufficient documentation

## 2021-01-21 DIAGNOSIS — Z5900 Homelessness unspecified: Secondary | ICD-10-CM | POA: Insufficient documentation

## 2021-01-21 DIAGNOSIS — F17210 Nicotine dependence, cigarettes, uncomplicated: Secondary | ICD-10-CM | POA: Insufficient documentation

## 2021-01-21 MED ORDER — RISPERIDONE 1 MG PO TABS
1.0000 mg | ORAL_TABLET | Freq: Once | ORAL | Status: AC
  Administered 2021-01-21: 1 mg via ORAL
  Filled 2021-01-21: qty 1

## 2021-01-21 NOTE — ED Triage Notes (Signed)
Patient requesting nightly dose of Risperdal, seen every night for same.

## 2021-01-21 NOTE — ED Provider Notes (Signed)
[facility] EMERGENCY DEPARTMENT Provider Note   CSN: [id number] Arrival date & time: 01/21/21  0108     History Chief Complaint  Patient presents with  . Medication Refill    Cole Barrett is a 35 y.o. male.  The history is provided by the patient and medical records.  Medication Refill   35 y.o. M with hx of schizophrenia, homelessness, presenting to the ED for his Risperdal.  This seems to now be his routine, comes in just about every night for this.  States he did recently go to [facility] and was given scripts.  States he went downtown to try and get his medicaid set up so he can get his medicine from pharmacy but was told it would be delayed since his information has changed (no address).    Past Medical History:  Diagnosis Date  . COVID-19   . Homelessness   . Hypercholesterolemia   . Mental disorder   . Paranoid behavior (HCC)   . PE (pulmonary embolism)   . Schizophrenia [facility])     Patient Active Problem List   Diagnosis Date Noted  . Acute psychosis (HCC) 08/31/2019  . Schizophrenia (HCC) 08/31/2019  . Schizoaffective disorder, bipolar type (HCC) 01/19/2019  . Alcohol abuse   . Auditory hallucinations   . Alcohol abuse with alcohol-induced mood disorder (HCC) 09/08/2016  . Homelessness 09/03/2016  . Person feigning illness 09/03/2016  . 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 Cannabis use disorder, mild, abuse 08/29/2016  . Pulmonary emboli (HCC) 07/13/2016  . Adjustment disorder with emotional disturbance 03/12/2014  . Pulmonary embolism and infarction (HCC) 01/02/2014  . Acute left flank pain 01/02/2014  . Pulmonary embolism, bilateral (HCC) 01/02/2014    History reviewed. No pertinent surgical history.     Family History  Problem Relation Age of Onset  . Hypertension Mother     Social History   Tobacco Use  . Smoking status: Current Every Day Smoker    Packs/day: 0.50    Types: Cigarettes  . Smokeless tobacco:  Never Used  Vaping Use  . Vaping Use: Never used  Substance Use Topics  . Alcohol use: Yes    Comment: BAC not available  . Drug use: Yes    Types: Marijuana    Comment: Last used: 2 months ago UDS NA    Home Medications Prior to Admission medications   Medication Sig Start Date End Date Taking? Authorizing Provider  RISPERDAL 1 MG tablet Take 1 tablet (1 mg total) by mouth at bedtime. 01/09/21   Long, Arlyss Repress, MD  amantadine (SYMMETREL) 100 MG capsule Take 1 capsule (100 mg total) by mouth 2 (two) times daily. Patient not taking: Reported on 11/04/2019 09/01/19 11/17/19  [name], NP    Allergies    Haldol [haloperidol]  Review of Systems   Review of Systems  Constitutional:       Med refill  All other systems reviewed and are negative.   Physical Exam Updated Vital Signs BP 128/72   Pulse 86   Temp 98.1 F (36.7 C) (Oral)   Ht 5\' 10"  (1.778 m)   Wt 99.8 kg   SpO2 97%   BMI 31.57 kg/m   Physical Exam Vitals and nursing note reviewed.  Constitutional:      Appearance: He is well-developed and well-nourished.     Comments: drink spilled down shirt, somewhat disheveled appearing but seems to be at his baseline  HENT:     Head: Normocephalic  and atraumatic.     Mouth/Throat:     Mouth: Oropharynx is clear and moist.  Eyes:     Extraocular Movements: EOM normal.     Conjunctiva/sclera: Conjunctivae normal.     Pupils: Pupils are equal, round, and reactive to light.  Cardiovascular:     Rate and Rhythm: Normal rate and regular rhythm.     Heart sounds: Normal heart sounds.  Pulmonary:     Effort: Pulmonary effort is normal. No respiratory distress.     Breath sounds: Normal breath sounds. No rhonchi.  Abdominal:     General: Bowel sounds are normal.     Palpations: Abdomen is soft.     Tenderness: There is no abdominal tenderness. There is no rebound.  Musculoskeletal:        General: Normal range of motion.     Cervical back: Normal range of motion.   Skin:    General: Skin is warm and dry.  Neurological:     Mental Status: He is alert and oriented to person, place, and time.  Psychiatric:        Mood and Affect: Mood and affect normal.     ED Results / Procedures / Treatments   Labs (all labs ordered are listed, but only abnormal results are displayed) Labs Reviewed - No data to display  EKG None  Radiology No results found.  Procedures Procedures   Medications Ordered in ED Medications  risperiDONE (RISPERDAL) tablet 1 mg (1 mg Oral Given 01/21/21 0144)    ED Course  I have reviewed the triage vital signs and the nursing notes.  Pertinent labs & imaging results that were available during my care of the patient were reviewed by me and considered in my medical decision making (see chart for details).    MDM Rules/Calculators/A&P  35 year old male here requesting his Risperdal.  He is well-known to this facility for similar complaints, 79 visits in the past 6 months.  He appears at his baseline here.  States he actually did go to social services and try to get his Medicaid set up but was told there will be a delay as he currently does not have an address.  Given dose of his Risperdal here.  Encouraged close outpatient follow-up.  Discharged home in stable condition.  Final Clinical Impression(s) / ED Diagnoses Final diagnoses:  Medication refill    Rx / DC Orders ED Discharge Orders    None       Garlon Hatchet, PA-C 01/21/21 0159    Gilda Crease, MD 01/21/21 [phone]

## 2021-02-06 ENCOUNTER — Other Ambulatory Visit: Payer: Self-pay

## 2021-02-06 ENCOUNTER — Encounter (HOSPITAL_COMMUNITY): Payer: Self-pay | Admitting: Emergency Medicine

## 2021-02-06 ENCOUNTER — Emergency Department (HOSPITAL_COMMUNITY)
Admission: EM | Admit: 2021-02-06 | Discharge: 2021-02-07 | Disposition: A | Discharge status: Home / Self Care | Payer: Self-pay | Attending: Emergency Medicine | Admitting: Emergency Medicine

## 2021-02-06 DIAGNOSIS — F17210 Nicotine dependence, cigarettes, uncomplicated: Secondary | ICD-10-CM | POA: Insufficient documentation

## 2021-02-06 DIAGNOSIS — Z8616 Personal history of COVID-19: Secondary | ICD-10-CM | POA: Insufficient documentation

## 2021-02-06 DIAGNOSIS — Z760 Encounter for issue of repeat prescription: Secondary | ICD-10-CM | POA: Insufficient documentation

## 2021-02-06 MED ORDER — RISPERDAL 1 MG PO TABS
1.0000 mg | ORAL_TABLET | Freq: Every day | ORAL | 0 refills | Status: DC
Start: 2021-02-06 — End: 2021-03-13

## 2021-02-06 MED ORDER — RISPERIDONE 1 MG PO TABS
1.0000 mg | ORAL_TABLET | Freq: Every day | ORAL | Status: DC
  Administered 2021-02-07: 1 mg via ORAL
  Filled 2021-02-06: qty 1

## 2021-02-06 NOTE — ED Triage Notes (Signed)
Patient arrives requesting his medication

## 2021-02-06 NOTE — ED Provider Notes (Signed)
[facility]-EMERGENCY DEPT Provider Note   CSN: [id number] Arrival date & time: 02/06/21  2252     History Chief Complaint  Patient presents with   Medication Refill    Cole Barrett is a 35 y.o. male.  Patient presents to the emergency department with a chief complaint of requesting medication refill.  He has hallucinations and takes Risperdal for this at bedtime.  He states that he is out of his medication.  He states that he has been doing well for the past several weeks, but has now run out of his meds.  He is requesting a dose tonight and a refill of his medicine.  He denies SI or HI.  Denies any other associated symptoms.  The history is provided by the patient. No language interpreter was used.       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 No pertinent surgical history.     Family History  Problem Relation Age of Onset   Hypertension Mother     Social History   Tobacco Use   Smoking status: Current Every Day Smoker    Packs/day: 0.50    Types: Cigarettes    Smokeless tobacco: Never Used  Vaping Use   Vaping Use: Never used  Substance Use Topics   Alcohol use: Yes    Comment: BAC not available   Drug use: Yes    Types: Marijuana    Comment: Last used: 2 months ago UDS NA    Home Medications Prior to Admission medications   Medication Sig Start Date End Date Taking? Authorizing Provider  RISPERDAL 1 MG tablet Take 1 tablet (1 mg total) by mouth at bedtime. 01/09/21   Long, Arlyss Repress, MD  amantadine (SYMMETREL) 100 MG capsule Take 1 capsule (100 mg total) by mouth 2 (two) times daily. Patient not taking: Reported on 11/04/2019 09/01/19 11/17/19  [name], NP    Allergies    Haldol [haloperidol]  Review of Systems   Review of Systems  All other systems reviewed and are negative.   Physical Exam Updated Vital Signs There were no vitals taken for this visit.  Physical Exam Vitals and nursing note reviewed.  Constitutional:      General: He is not in acute distress.    Appearance: He is well-developed. He is not ill-appearing.  HENT:     Head: Normocephalic and atraumatic.  Eyes:     Conjunctiva/sclera: Conjunctivae normal.  Cardiovascular:     Rate and Rhythm: Normal rate.  Pulmonary:     Effort: Pulmonary effort is normal. No respiratory distress.  Abdominal:  General: There is no distension.  Musculoskeletal:     Cervical back: Neck supple.     Comments: Moves all extremities  Skin:    General: Skin is warm and dry.  Neurological:     Mental Status: He is alert and oriented to person, place, and time.  Psychiatric:        Mood and Affect: Mood normal.        Behavior: Behavior normal.     ED Results / Procedures / Treatments   Labs (all labs ordered are listed, but only abnormal results are displayed) Labs Reviewed - No data to display  EKG None  Radiology No results found.  Procedures Procedures   Medications Ordered in ED Medications  risperiDONE (RISPERDAL) tablet 1 mg (has no  administration in time range)    ED Course  I have reviewed the triage vital signs and the nursing notes.  Pertinent labs & imaging results that were available during my care of the patient were reviewed by me and considered in my medical decision making (see chart for details).    MDM Rules/Calculators/A&P                          Patient here for medication refill.  No other complaints.  States he is going to go to [facility] tomorrow.  Will give him a dose tonight and an Rx for 2 weeks. Final Clinical Impression(s) / ED Diagnoses Final diagnoses:  Medication refill    Rx / DC Orders ED Discharge Orders    None       Felipa Furnace 02/06/21 2320    [name], MD 02/07/21 2240

## 2021-02-11 ENCOUNTER — Emergency Department (HOSPITAL_COMMUNITY)
Admission: EM | Admit: 2021-02-11 | Discharge: 2021-02-11 | Disposition: A | Discharge status: Home / Self Care | Payer: Self-pay | Attending: Emergency Medicine | Admitting: Emergency Medicine

## 2021-02-11 ENCOUNTER — Other Ambulatory Visit: Payer: Self-pay

## 2021-02-11 ENCOUNTER — Encounter (HOSPITAL_COMMUNITY): Payer: Self-pay

## 2021-02-11 DIAGNOSIS — Z760 Encounter for issue of repeat prescription: Secondary | ICD-10-CM | POA: Insufficient documentation

## 2021-02-11 DIAGNOSIS — Z8616 Personal history of COVID-19: Secondary | ICD-10-CM | POA: Insufficient documentation

## 2021-02-11 DIAGNOSIS — Z7689 Persons encountering health services in other specified circumstances: Secondary | ICD-10-CM

## 2021-02-11 DIAGNOSIS — F17210 Nicotine dependence, cigarettes, uncomplicated: Secondary | ICD-10-CM | POA: Insufficient documentation

## 2021-02-11 MED ORDER — RISPERIDONE 1 MG PO TABS
1.0000 mg | ORAL_TABLET | Freq: Once | ORAL | Status: AC
  Administered 2021-02-11: 1 mg via ORAL
  Filled 2021-02-11: qty 1

## 2021-02-11 NOTE — ED Provider Notes (Signed)
[facility]-EMERGENCY DEPT Provider Note   CSN: [id number] Arrival date & time: 02/11/21  0032     History Chief Complaint  Patient presents with  . Medication Refill    Cole Barrett is a 35 y.o. male with a hx of schizophrenia who presents to the ED requesting a dose of his risperdal. He states that he takes this medication at bedtime, he has run out, he is requesting a dose here tonight, states he does not need a refill at this time.  He has otherwise been feeling well.  He has no complaints.  He denies SI, HI, or current hallucinations.  HPI     Past Medical History:  Diagnosis Date  . COVID-19   . Homelessness   . Hypercholesterolemia   . Mental disorder   . Paranoid behavior (HCC)   . PE (pulmonary embolism)   . Schizophrenia [facility])     Patient Active Problem List   Diagnosis Date Noted  . Acute psychosis (HCC) 08/31/2019  . Schizophrenia (HCC) 08/31/2019  . Schizoaffective disorder, bipolar type (HCC) 01/19/2019  . Alcohol abuse   . Auditory hallucinations   . Alcohol abuse with alcohol-induced mood disorder (HCC) 09/08/2016  . Homelessness 09/03/2016  . Person feigning illness 09/03/2016  . 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 Cannabis use disorder, mild, abuse 08/29/2016  . Pulmonary emboli (HCC) 07/13/2016  . Adjustment disorder with emotional disturbance 03/12/2014  . Pulmonary embolism and infarction (HCC) 01/02/2014  . Acute left flank pain 01/02/2014  . Pulmonary embolism, bilateral (HCC) 01/02/2014    History reviewed. No pertinent surgical history.     Family History  Problem Relation Age of Onset  . Hypertension Mother     Social History   Tobacco Use  . Smoking status: Current Every Day Smoker    Packs/day: 0.50    Types: Cigarettes  . Smokeless tobacco: Never Used  Vaping Use  . Vaping Use: Never used  Substance Use Topics  . Alcohol use: Yes    Comment: BAC not available  . Drug use: Yes     Types: Marijuana    Comment: Last used: 2 months ago UDS NA    Home Medications Prior to Admission medications   Medication Sig Start Date End Date Taking? Authorizing Provider  RISPERDAL 1 MG tablet Take 1 tablet (1 mg total) by mouth at bedtime. 02/06/21   Roxy Horseman, PA-C  amantadine (SYMMETREL) 100 MG capsule Take 1 capsule (100 mg total) by mouth 2 (two) times daily. Patient not taking: Reported on 11/04/2019 09/01/19 11/17/19  [name], NP    Allergies    Haldol [haloperidol]  Review of Systems   Review of Systems  Constitutional: Negative for chills and fever.  Respiratory: Negative for shortness of breath.   Cardiovascular: Negative for chest pain.  Gastrointestinal: Negative for abdominal pain.  Neurological: Negative for syncope.  Psychiatric/Behavioral: Negative for hallucinations and suicidal ideas.  All other systems reviewed and are negative.   Physical Exam Updated Vital Signs BP 132/75 (BP Location: Right Arm)   Pulse 85   Temp 98.5 F (36.9 C) (Oral)   Resp 16   SpO2 97%   Physical Exam Vitals and nursing note reviewed.  Constitutional:      General: He is not in acute distress.    Appearance: He is well-developed. He is not toxic-appearing.  HENT:     Head: Normocephalic and atraumatic.  Eyes:     General:  Right eye: No discharge.        Left eye: No discharge.     Conjunctiva/sclera: Conjunctivae normal.  Cardiovascular:     Rate and Rhythm: Normal rate and regular rhythm.  Pulmonary:     Effort: Pulmonary effort is normal. No respiratory distress.     Breath sounds: Normal breath sounds. No wheezing, rhonchi or rales.  Abdominal:     General: There is no distension.     Palpations: Abdomen is soft.     Tenderness: There is no abdominal tenderness.  Musculoskeletal:     Cervical back: Neck supple.  Skin:    General: Skin is warm and dry.     Findings: No rash.  Neurological:     Mental Status: He is alert.      Comments: Clear speech.   Psychiatric:        Behavior: Behavior normal.     ED Results / Procedures / Treatments   Labs (all labs ordered are listed, but only abnormal results are displayed) Labs Reviewed - No data to display  EKG None  Radiology No results found.  Procedures Procedures   Medications Ordered in ED Medications  risperiDONE (RISPERDAL) tablet 1 mg (has no administration in time range)    ED Course  I have reviewed the triage vital signs and the nursing notes.  Pertinent labs & imaging results that were available during my care of the patient were reviewed by me and considered in my medical decision making (see chart for details).    MDM Rules/Calculators/A&P                          Patient presenting requesting a dose of his Risperdal.  He has no other complaints.  He does not appear acutely psychotic and is denying any SI or HI.  He will be given a dose in the emergency department.  Overall appears appropriate for discharge at this time.  Final Clinical Impression(s) / ED Diagnoses Final diagnoses:  Encounter for medication administration    Rx / DC Orders ED Discharge Orders    None       Cherly Anderson, PA-C 02/11/21 0133    Geoffery Lyons, MD 02/11/21 [phone]

## 2021-02-11 NOTE — ED Triage Notes (Signed)
Pt requesting dose of Risperdal.

## 2021-02-18 ENCOUNTER — Encounter (HOSPITAL_COMMUNITY): Payer: Self-pay

## 2021-02-18 ENCOUNTER — Emergency Department (HOSPITAL_COMMUNITY)
Admission: EM | Admit: 2021-02-18 | Discharge: 2021-02-18 | Disposition: A | Discharge status: Home / Self Care | Payer: Self-pay | Attending: Emergency Medicine | Admitting: Emergency Medicine

## 2021-02-18 ENCOUNTER — Other Ambulatory Visit: Payer: Self-pay

## 2021-02-18 DIAGNOSIS — Z8616 Personal history of COVID-19: Secondary | ICD-10-CM | POA: Insufficient documentation

## 2021-02-18 DIAGNOSIS — Z760 Encounter for issue of repeat prescription: Secondary | ICD-10-CM | POA: Insufficient documentation

## 2021-02-18 DIAGNOSIS — F17210 Nicotine dependence, cigarettes, uncomplicated: Secondary | ICD-10-CM | POA: Insufficient documentation

## 2021-02-18 MED ORDER — RISPERIDONE 0.5 MG PO TABS
0.5000 mg | ORAL_TABLET | Freq: Once | ORAL | Status: AC
  Administered 2021-02-18: 0.5 mg via ORAL
  Filled 2021-02-18: qty 1

## 2021-02-18 NOTE — ED Triage Notes (Signed)
Pt requesting dose of risperdal, states last dose was yesterday. Denies SI/HI, hallucinations

## 2021-02-18 NOTE — ED Provider Notes (Signed)
[facility]-EMERGENCY DEPT Provider Note   CSN: [id number] Arrival date & time: 02/18/21  0001     History Chief Complaint  Patient presents with  . Medication Refill    Cole Barrett is a 35 y.o. male.  Patient with history of schizophrenia presents to the emergency department today requesting a dose of Risperdal.  Patient states that he takes this at night.  He has not had a dose since yesterday.  He states that he feels anxious and unsettled when he does not have the medicine.  Currently denies hallucinations.  He denies other medical complaints.  History of multiple previous presentations for same.        Past Medical History:  Diagnosis Date  . COVID-19   . Homelessness   . Hypercholesterolemia   . Mental disorder   . Paranoid behavior (HCC)   . PE (pulmonary embolism)   . Schizophrenia [facility])     Patient Active Problem List   Diagnosis Date Noted  . Acute psychosis (HCC) 08/31/2019  . Schizophrenia (HCC) 08/31/2019  . Schizoaffective disorder, bipolar type (HCC) 01/19/2019  . Alcohol abuse   . Auditory hallucinations   . Alcohol abuse with alcohol-induced mood disorder (HCC) 09/08/2016  . Homelessness 09/03/2016  . Person feigning illness 09/03/2016  . 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 Cannabis use disorder, mild, abuse 08/29/2016  . Pulmonary emboli (HCC) 07/13/2016  . Adjustment disorder with emotional disturbance 03/12/2014  . Pulmonary embolism and infarction (HCC) 01/02/2014  . Acute left flank pain 01/02/2014  . Pulmonary embolism, bilateral (HCC) 01/02/2014    History reviewed. No pertinent surgical history.     Family History  Problem Relation Age of Onset  . Hypertension Mother     Social History   Tobacco Use  . Smoking status: Current Every Day Smoker    Packs/day: 0.50    Types: Cigarettes  . Smokeless tobacco: Never Used  Vaping Use  . Vaping Use: Never used  Substance Use Topics  .  Alcohol use: Yes    Comment: BAC not available  . Drug use: Yes    Types: Marijuana    Comment: Last used: 2 months ago UDS NA    Home Medications Prior to Admission medications   Medication Sig Start Date End Date Taking? Authorizing Provider  RISPERDAL 1 MG tablet Take 1 tablet (1 mg total) by mouth at bedtime. 02/06/21   Roxy Horseman, PA-C  amantadine (SYMMETREL) 100 MG capsule Take 1 capsule (100 mg total) by mouth 2 (two) times daily. Patient not taking: Reported on 11/04/2019 09/01/19 11/17/19  [name], NP    Allergies    Haldol [haloperidol]  Review of Systems   Review of Systems  Respiratory: Negative for shortness of breath.   Cardiovascular: Negative for chest pain.  Gastrointestinal: Negative for nausea and vomiting.  Psychiatric/Behavioral: Positive for sleep disturbance. The patient is nervous/anxious.     Physical Exam Updated Vital Signs BP (!) 149/75 (BP Location: Left Arm)   Pulse 88   Temp 98 F (36.7 C) (Oral)   Resp 16   Ht 5\' 10"  (1.778 m)   Wt 99.8 kg   SpO2 99%   BMI 31.57 kg/m   Physical Exam Vitals and nursing note reviewed.  Constitutional:      Appearance: He is well-developed.  HENT:     Head: Normocephalic and atraumatic.  Eyes:     Conjunctiva/sclera: Conjunctivae normal.  Pulmonary:     Effort: No respiratory  distress.  Musculoskeletal:     Cervical back: Normal range of motion and neck supple.  Skin:    General: Skin is warm and dry.  Neurological:     Mental Status: He is alert.     Comments: Patient is sleepy but arousable to voice.  He answers questions appropriately.  Psychiatric:     Comments: Patient unkempt.      ED Results / Procedures / Treatments   Labs (all labs ordered are listed, but only abnormal results are displayed) Labs Reviewed - No data to display  EKG None  Radiology No results found.  Procedures Procedures   Medications Ordered in ED Medications  risperiDONE (RISPERDAL) tablet  0.5 mg (has no administration in time range)    ED Course  I have reviewed the triage vital signs and the nursing notes.  Pertinent labs & imaging results that were available during my care of the patient were reviewed by me and considered in my medical decision making (see chart for details).  Patient seen and examined.  Will give 0.5 mg Risperdal as patient is already fairly sleepy.  He states that he will follow up with behavioral health tomorrow, when I asked how he is going to get his medication after today.  I encouraged him to do so for medication management.  Vital signs reviewed and are as follows: BP (!) 149/75 (BP Location: Left Arm)   Pulse 88   Temp 98 F (36.7 C) (Oral)   Resp 16   Ht 5\' 10"  (1.778 m)   Wt 99.8 kg   SpO2 99%   BMI 31.57 kg/m       MDM Rules/Calculators/A&P                          Patient here for Risperdal dose.  Previous presentations for same.  No acute emergencies suspected.   Final Clinical Impression(s) / ED Diagnoses Final diagnoses:  Medication refill    Rx / DC Orders ED Discharge Orders    None       , PA-C 02/18/21 0054    02/20/21, DO 02/18/21 0107

## 2021-02-20 ENCOUNTER — Encounter (HOSPITAL_COMMUNITY): Payer: Self-pay | Admitting: Emergency Medicine

## 2021-02-20 ENCOUNTER — Emergency Department (HOSPITAL_COMMUNITY)
Admission: EM | Admit: 2021-02-20 | Discharge: 2021-02-20 | Disposition: A | Discharge status: Home / Self Care | Payer: Self-pay | Attending: Emergency Medicine | Admitting: Emergency Medicine

## 2021-02-20 ENCOUNTER — Other Ambulatory Visit: Payer: Self-pay

## 2021-02-20 DIAGNOSIS — M79602 Pain in left arm: Secondary | ICD-10-CM | POA: Insufficient documentation

## 2021-02-20 DIAGNOSIS — F17210 Nicotine dependence, cigarettes, uncomplicated: Secondary | ICD-10-CM | POA: Insufficient documentation

## 2021-02-20 DIAGNOSIS — R443 Hallucinations, unspecified: Secondary | ICD-10-CM | POA: Insufficient documentation

## 2021-02-20 DIAGNOSIS — Z8616 Personal history of COVID-19: Secondary | ICD-10-CM | POA: Insufficient documentation

## 2021-02-20 DIAGNOSIS — Z5900 Homelessness unspecified: Secondary | ICD-10-CM | POA: Insufficient documentation

## 2021-02-20 DIAGNOSIS — Z760 Encounter for issue of repeat prescription: Secondary | ICD-10-CM | POA: Insufficient documentation

## 2021-02-20 MED ORDER — RISPERIDONE 1 MG PO TABS
1.0000 mg | ORAL_TABLET | Freq: Every day | ORAL | Status: DC
  Administered 2021-02-20: 1 mg via ORAL
  Filled 2021-02-20: qty 1

## 2021-02-20 NOTE — ED Triage Notes (Signed)
Patient presents requesting his Risperdal. Patient denies SI/HI.

## 2021-02-20 NOTE — ED Provider Notes (Signed)
[facility]-EMERGENCY DEPT Provider Note   CSN: [id number] Arrival date & time: 02/20/21  0008     History Chief Complaint  Patient presents with  . Medication Refill    Cole Barrett is a 35 y.o. male.  Patient presents to the emergency department for his nighttime dose of Risperdal.  He is seen frequently for the same.  He reports hallucinations, but denies any suicidal or homicidal thoughts.  He denies any other problems at this time.  States that he has somewhere to stay tonight.  The history is provided by the patient. No language interpreter was used.       Past Medical History:  Diagnosis Date  . COVID-19   . Homelessness   . Hypercholesterolemia   . Mental disorder   . Paranoid behavior (HCC)   . PE (pulmonary embolism)   . Schizophrenia [facility])     Patient Active Problem List   Diagnosis Date Noted  . Acute psychosis (HCC) 08/31/2019  . Schizophrenia (HCC) 08/31/2019  . Schizoaffective disorder, bipolar type (HCC) 01/19/2019  . Alcohol abuse   . Auditory hallucinations   . Alcohol abuse with alcohol-induced mood disorder (HCC) 09/08/2016  . Homelessness 09/03/2016  . Person feigning illness 09/03/2016  . 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 Cannabis use disorder, mild, abuse 08/29/2016  . Pulmonary emboli (HCC) 07/13/2016  . Adjustment disorder with emotional disturbance 03/12/2014  . Pulmonary embolism and infarction (HCC) 01/02/2014  . Acute left flank pain 01/02/2014  . Pulmonary embolism, bilateral (HCC) 01/02/2014    History reviewed. No pertinent surgical history.     Family History  Problem Relation Age of Onset  . Hypertension Mother     Social History   Tobacco Use  . Smoking status: Current Every Day Smoker    Packs/day: 0.50    Types: Cigarettes  . Smokeless tobacco: Never Used  Vaping Use  . Vaping Use: Never used  Substance Use Topics  . Alcohol use: Yes    Comment: BAC not available   . Drug use: Yes    Types: Marijuana    Comment: Last used: 2 months ago UDS NA    Home Medications Prior to Admission medications   Medication Sig Start Date End Date Taking? Authorizing Provider  RISPERDAL 1 MG tablet Take 1 tablet (1 mg total) by mouth at bedtime. 02/06/21   Roxy Horseman, PA-C  amantadine (SYMMETREL) 100 MG capsule Take 1 capsule (100 mg total) by mouth 2 (two) times daily. Patient not taking: Reported on 11/04/2019 09/01/19 11/17/19  [name], NP    Allergies    Haldol [haloperidol]  Review of Systems   Review of Systems  All other systems reviewed and are negative.   Physical Exam Updated Vital Signs BP 125/85 (BP Location: Right Arm)   Pulse 72   Temp 98 F (36.7 C) (Oral)   Resp 18   Ht 5\' 10"  (1.778 m)   Wt 99.8 kg   SpO2 98%   BMI 31.57 kg/m   Physical Exam Vitals and nursing note reviewed.  Constitutional:      General: He is not in acute distress.    Appearance: He is well-developed. He is not ill-appearing.  HENT:     Head: Normocephalic and atraumatic.  Eyes:     Conjunctiva/sclera: Conjunctivae normal.  Cardiovascular:     Rate and Rhythm: Normal rate.  Pulmonary:     Effort: Pulmonary effort is normal. No respiratory distress.  Abdominal:  General: There is no distension.  Musculoskeletal:     Cervical back: Neck supple.     Comments: Moves all extremities  Skin:    General: Skin is warm and dry.  Neurological:     Mental Status: He is alert and oriented to person, place, and time.  Psychiatric:        Mood and Affect: Mood normal.        Behavior: Behavior normal.     ED Results / Procedures / Treatments   Labs (all labs ordered are listed, but only abnormal results are displayed) Labs Reviewed - No data to display  EKG None  Radiology No results found.  Procedures Procedures   Medications Ordered in ED Medications  risperiDONE (RISPERDAL) tablet 1 mg (1 mg Oral Given 02/20/21 0028)    ED  Course  I have reviewed the triage vital signs and the nursing notes.  Pertinent labs & imaging results that were available during my care of the patient were reviewed by me and considered in my medical decision making (see chart for details).    MDM Rules/Calculators/A&P                          Patient here for medication refill of his Risperdal.  He is given a dose now.  He is alert and oriented.  He states he has somewhere to stay tonight.  Denies SI or HI.  Feel that he is stable for discharge.  Final Clinical Impression(s) / ED Diagnoses Final diagnoses:  Medication refill    Rx / DC Orders ED Discharge Orders    None       Roxy Horseman, PA-C 02/20/21 0036    Maia Plan, MD 02/20/21 2040

## 2021-02-20 NOTE — ED Notes (Signed)
Chart opened to reprint discharge paperwork per pt request.

## 2021-02-20 NOTE — ED Triage Notes (Signed)
Pt reports L arm pain. States that it got slammed in a door. No obvious deformity.

## 2021-02-21 ENCOUNTER — Emergency Department (HOSPITAL_COMMUNITY)
Admission: EM | Admit: 2021-02-21 | Discharge: 2021-02-21 | Disposition: A | Discharge status: Home / Self Care | Payer: Self-pay | Attending: Emergency Medicine | Admitting: Emergency Medicine

## 2021-02-21 DIAGNOSIS — M79632 Pain in left forearm: Secondary | ICD-10-CM

## 2021-02-21 MED ORDER — IBUPROFEN 800 MG PO TABS
800.0000 mg | ORAL_TABLET | Freq: Once | ORAL | Status: AC
  Administered 2021-02-21: 800 mg via ORAL
  Filled 2021-02-21: qty 1

## 2021-02-21 NOTE — ED Provider Notes (Signed)
[facility]-EMERGENCY DEPT Provider Note   CSN: [id number] Arrival date & time: 02/20/21  2224     History Chief Complaint  Patient presents with  . Arm Pain    Cole Barrett is a 35 y.o. male well known to the emergency department presents to the Emergency Department complaining of acute, persistent left forearm pain after it was closed in a door.  Patient reports he manually opened a door that swung shut striking his left forearm.  He reports this happened around 8 PM.  He states it is very sore but he has had no difficulty using it.  No numbness, tingling or weakness.  No treatments prior to arrival.  No open wounds.   The history is provided by the patient and medical records. No language interpreter was used.       Past Medical History:  Diagnosis Date  . COVID-19   . Homelessness   . Hypercholesterolemia   . Mental disorder   . Paranoid behavior (HCC)   . PE (pulmonary embolism)   . Schizophrenia [facility])     Patient Active Problem List   Diagnosis Date Noted  . Acute psychosis (HCC) 08/31/2019  . Schizophrenia (HCC) 08/31/2019  . Schizoaffective disorder, bipolar type (HCC) 01/19/2019  . Alcohol abuse   . Auditory hallucinations   . Alcohol abuse with alcohol-induced mood disorder (HCC) 09/08/2016  . Homelessness 09/03/2016  . Person feigning illness 09/03/2016  . 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 Cannabis use disorder, mild, abuse 08/29/2016  . Pulmonary emboli (HCC) 07/13/2016  . Adjustment disorder with emotional disturbance 03/12/2014  . Pulmonary embolism and infarction (HCC) 01/02/2014  . Acute left flank pain 01/02/2014  . Pulmonary embolism, bilateral (HCC) 01/02/2014    History reviewed. No pertinent surgical history.     Family History  Problem Relation Age of Onset  . Hypertension Mother     Social History   Tobacco Use  . Smoking status: Current Every Day Smoker    Packs/day: 0.50    Types:  Cigarettes  . Smokeless tobacco: Never Used  Vaping Use  . Vaping Use: Never used  Substance Use Topics  . Alcohol use: Yes    Comment: BAC not available  . Drug use: Yes    Types: Marijuana    Comment: Last used: 2 months ago UDS NA    Home Medications Prior to Admission medications   Medication Sig Start Date End Date Taking? Authorizing Provider  RISPERDAL 1 MG tablet Take 1 tablet (1 mg total) by mouth at bedtime. 02/06/21   Roxy Horseman, PA-C  amantadine (SYMMETREL) 100 MG capsule Take 1 capsule (100 mg total) by mouth 2 (two) times daily. Patient not taking: Reported on 11/04/2019 09/01/19 11/17/19  [name], NP    Allergies    Haldol [haloperidol]  Review of Systems   Review of Systems  Constitutional: Negative for fever.  Musculoskeletal: Positive for arthralgias.  Skin: Negative for wound.  Neurological: Negative for numbness.    Physical Exam Updated Vital Signs BP 123/84   Pulse 70   Temp 99 F (37.2 C) (Oral)   Resp 16   SpO2 100%   Physical Exam Vitals and nursing note reviewed.  Constitutional:      General: He is not in acute distress.    Appearance: He is well-developed.  HENT:     Head: Normocephalic.  Eyes:     General: No scleral icterus.    Conjunctiva/sclera: Conjunctivae normal.  Cardiovascular:  Rate and Rhythm: Normal rate.  Pulmonary:     Effort: Pulmonary effort is normal.  Musculoskeletal:        General: Normal range of motion.     Left shoulder: Normal.     Left upper arm: Normal.     Left elbow: No swelling, deformity, effusion or lacerations. Normal range of motion. No tenderness.     Left forearm: Tenderness present. No swelling, edema or lacerations.     Left wrist: Normal.     Left hand: Normal.     Cervical back: Normal range of motion.  Skin:    General: Skin is warm and dry.     Capillary Refill: Capillary refill takes less than 2 seconds.  Neurological:     Mental Status: He is alert.     ED  Results / Procedures / Treatments    Procedures Procedures   Medications Ordered in ED Medications  ibuprofen (ADVIL) tablet 800 mg (800 mg Oral Given 02/21/21 0049)    ED Course  I have reviewed the triage vital signs and the nursing notes.  Pertinent labs & imaging results that were available during my care of the patient were reviewed by me and considered in my medical decision making (see chart for details).    MDM Rules/Calculators/A&P                           Patient presents with soreness to the left forearm after minor injury.  Neurovascularly intact.  5/5 strength in the left upper extremity including strong grip strength.  Patient declines x-rays.  Wishes only for pain control.  Ibuprofen given.  Patient will be discharged.  Vital signs improved significantly since triage without intervention.  BP 123/84   Pulse 70   Temp 99 F (37.2 C) (Oral)   Resp 16   SpO2 100%    Final Clinical Impression(s) / ED Diagnoses Final diagnoses:  Left forearm pain    Rx / DC Orders ED Discharge Orders    None       Muthersbaugh, Boyd Kerbs 02/21/21 0102    Zadie Rhine, MD 02/21/21 [phone]

## 2021-02-27 ENCOUNTER — Emergency Department (HOSPITAL_COMMUNITY)
Admission: EM | Admit: 2021-02-27 | Discharge: 2021-02-27 | Disposition: A | Discharge status: Home / Self Care | Payer: Self-pay | Attending: Emergency Medicine | Admitting: Emergency Medicine

## 2021-02-27 ENCOUNTER — Encounter (HOSPITAL_COMMUNITY): Payer: Self-pay | Admitting: Emergency Medicine

## 2021-02-27 ENCOUNTER — Other Ambulatory Visit: Payer: Self-pay

## 2021-02-27 DIAGNOSIS — Z760 Encounter for issue of repeat prescription: Secondary | ICD-10-CM | POA: Insufficient documentation

## 2021-02-27 DIAGNOSIS — F17210 Nicotine dependence, cigarettes, uncomplicated: Secondary | ICD-10-CM | POA: Insufficient documentation

## 2021-02-27 DIAGNOSIS — Z5900 Homelessness unspecified: Secondary | ICD-10-CM | POA: Insufficient documentation

## 2021-02-27 DIAGNOSIS — R440 Auditory hallucinations: Secondary | ICD-10-CM | POA: Insufficient documentation

## 2021-02-27 DIAGNOSIS — Z8616 Personal history of COVID-19: Secondary | ICD-10-CM | POA: Insufficient documentation

## 2021-02-27 MED ORDER — RISPERIDONE 1 MG PO TABS
1.0000 mg | ORAL_TABLET | Freq: Once | ORAL | Status: AC
  Administered 2021-02-27: 1 mg via ORAL
  Filled 2021-02-27: qty 1

## 2021-02-27 NOTE — ED Triage Notes (Signed)
Patient is requesting a dose of risperdal because he misplaced his.

## 2021-02-27 NOTE — ED Notes (Signed)
Patient is resting comfortably. 

## 2021-02-27 NOTE — ED Provider Notes (Signed)
[facility]-EMERGENC[facility] DEPT Provider Note   CSN: [id number] Arrival date & time: 02/27/21  0320     History Chief Complaint  Patient presents with  . Hallucinations    Cole Barrett is a 35 y.o. male.  The history is provided by the patient and medical records.    35 year old male with history of schizophrenia, chronic auditory hallucinations, adjustment disorder, homelessness, presenting to the ED requesting dose of Risperdal.  States he misplaced his medications.  No physical complaints at present.  Patient seen almost nightly for similar complaints.  85 visits in the past 6 months for similar.  Past Medical History:  Diagnosis Date  . COVID-19   . Homelessness   . Hypercholesterolemia   . Mental disorder   . Paranoid behavior (HCC)   . PE (pulmonary embolism)   . Schizophrenia [facility])     Patient Active Problem List   Diagnosis Date Noted  . Acute psychosis (HCC) 08/31/2019  . Schizophrenia (HCC) 08/31/2019  . Schizoaffective disorder, bipolar type (HCC) 01/19/2019  . Alcohol abuse   . Auditory hallucinations   . Alcohol abuse with alcohol-induced mood disorder (HCC) 09/08/2016  . Homelessness 09/03/2016  . Person feigning illness 09/03/2016  . 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 Cannabis use disorder, mild, abuse 08/29/2016  . Pulmonary emboli (HCC) 07/13/2016  . Adjustment disorder with emotional disturbance 03/12/2014  . Pulmonary embolism and infarction (HCC) 01/02/2014  . Acute left flank pain 01/02/2014  . Pulmonary embolism, bilateral (HCC) 01/02/2014    History reviewed. No pertinent surgical history.     Family History  Problem Relation Age of Onset  . Hypertension Mother     Social History   Tobacco Use  . Smoking status: Current Every Day Smoker    Packs/day: 0.50    Types: Cigarettes  . Smokeless tobacco: Never Used  Vaping Use  . Vaping Use: Never used  Substance Use Topics  . Alcohol use: Yes     Comment: BAC not available  . Drug use: Yes    Types: Marijuana    Comment: Last used: 2 months ago UDS NA    Home Medications Prior to Admission medications   Medication Sig Start Date End Date Taking? Authorizing Provider  RISPERDAL 1 MG tablet Take 1 tablet (1 mg total) by mouth at bedtime. 02/06/21   Roxy Horseman, PA-C  amantadine (SYMMETREL) 100 MG capsule Take 1 capsule (100 mg total) by mouth 2 (two) times daily. Patient not taking: Reported on 11/04/2019 09/01/19 11/17/19  [name], NP    Allergies    Haldol [haloperidol]  Review of Systems   Review of Systems  Constitutional:       Medications  All other systems reviewed and are negative.   Physical Exam Updated Vital Signs BP (!) 143/88 (BP Location: Right Arm)   Pulse 73   Temp 97.9 F (36.6 C) (Oral)   Resp 18   Ht 5\' 10"  (1.778 m)   Wt 99.8 kg   SpO2 95%   BMI 31.57 kg/m   Physical Exam Vitals and nursing note reviewed.  Constitutional:      Appearance: He is well-developed.  HENT:     Head: Normocephalic and atraumatic.  Eyes:     Conjunctiva/sclera: Conjunctivae normal.     Pupils: Pupils are equal, round, and reactive to light.  Cardiovascular:     Rate and Rhythm: Normal rate and regular rhythm.     Heart sounds: Normal heart sounds.  Pulmonary:     Effort: Pulmonary effort is normal.     Breath sounds: Normal breath sounds.  Abdominal:     General: Bowel sounds are normal.     Palpations: Abdomen is soft.  Musculoskeletal:        General: Normal range of motion.     Cervical back: Normal range of motion.  Skin:    General: Skin is warm and dry.  Neurological:     Mental Status: He is alert and oriented to person, place, and time.     ED Results / Procedures / Treatments   Labs (all labs ordered are listed, but only abnormal results are displayed) Labs Reviewed - No data to display  EKG None  Radiology No results found.  Procedures Procedures   Medications  Ordered in ED Medications  risperiDONE (RISPERDAL) tablet 1 mg (1 mg Oral Given 02/27/21 0356)    ED Course  I have reviewed the triage vital signs and the nursing notes.  Pertinent labs & imaging results that were available during my care of the patient were reviewed by me and considered in my medical decision making (see chart for details).    MDM Rules/Calculators/A&P  35 year old male here requesting dose of Risperdal.  He is well-known to this healthcare system with nearly nightly visits for the same.  He has chronic auditory hallucinations.  He appears at his baseline.  Given dose of Risperdal.  Can follow-up with primary care.  May return for new or acute changes.  Final Clinical Impression(s) / ED Diagnoses Final diagnoses:  Medication refill    Rx / DC Orders ED Discharge Orders    None       Garlon Hatchet, PA-C 02/27/21 0402    Molpus, Jonny Ruiz, MD 02/27/21 [phone]

## 2021-03-02 ENCOUNTER — Other Ambulatory Visit: Payer: Self-pay

## 2021-03-02 ENCOUNTER — Encounter (HOSPITAL_COMMUNITY): Payer: Self-pay

## 2021-03-02 ENCOUNTER — Emergency Department (HOSPITAL_COMMUNITY)
Admission: EM | Admit: 2021-03-02 | Discharge: 2021-03-02 | Disposition: A | Discharge status: Home / Self Care | Payer: Self-pay | Attending: Emergency Medicine | Admitting: Emergency Medicine

## 2021-03-02 DIAGNOSIS — Z760 Encounter for issue of repeat prescription: Secondary | ICD-10-CM | POA: Insufficient documentation

## 2021-03-02 DIAGNOSIS — Z8616 Personal history of COVID-19: Secondary | ICD-10-CM | POA: Insufficient documentation

## 2021-03-02 DIAGNOSIS — F17210 Nicotine dependence, cigarettes, uncomplicated: Secondary | ICD-10-CM | POA: Insufficient documentation

## 2021-03-02 MED ORDER — RISPERIDONE 1 MG PO TABS
1.0000 mg | ORAL_TABLET | Freq: Once | ORAL | Status: AC
  Administered 2021-03-02: 1 mg via ORAL
  Filled 2021-03-02: qty 1

## 2021-03-02 NOTE — ED Provider Notes (Addendum)
[facility]-EMERG[facility] DEPT Provider Note   CSN: [id number] Arrival date & time: 03/02/21  2145     History Chief Complaint  Patient presents with  . Medication Refill    Cole Barrett is a 35 y.o. male with a hx of tobacco abuse, schizophrenia, homelessness, and prior pulmonary embolism who presents to the emergency department requesting a dose of his evening Risperdal.  Patient states he has no complaints at this time, he would just like a dose of his medicines, no alleviating or aggravating factors.  He denies SI, HI, or hallucinations.  Denies chest pain, dyspnea, or syncope.  HPI     Past Medical History:  Diagnosis Date  . COVID-19   . Homelessness   . Hypercholesterolemia   . Mental disorder   . Paranoid behavior (HCC)   . PE (pulmonary embolism)   . Schizophrenia [facility])     Patient Active Problem List   Diagnosis Date Noted  . Acute psychosis (HCC) 08/31/2019  . Schizophrenia (HCC) 08/31/2019  . Schizoaffective disorder, bipolar type (HCC) 01/19/2019  . Alcohol abuse   . Auditory hallucinations   . Alcohol abuse with alcohol-induced mood disorder (HCC) 09/08/2016  . Homelessness 09/03/2016  . Person feigning illness 09/03/2016  . 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 Cannabis use disorder, mild, abuse 08/29/2016  . Pulmonary emboli (HCC) 07/13/2016  . Adjustment disorder with emotional disturbance 03/12/2014  . Pulmonary embolism and infarction (HCC) 01/02/2014  . Acute left flank pain 01/02/2014  . Pulmonary embolism, bilateral (HCC) 01/02/2014    History reviewed. No pertinent surgical history.     Family History  Problem Relation Age of Onset  . Hypertension Mother     Social History   Tobacco Use  . Smoking status: Current Every Day Smoker    Packs/day: 0.50    Types: Cigarettes  . Smokeless tobacco: Never Used  Vaping Use  . Vaping Use: Never used  Substance Use Topics  . Alcohol use: Yes    Comment:  BAC not available  . Drug use: Yes    Types: Marijuana    Comment: Last used: 2 months ago UDS NA    Home Medications Prior to Admission medications   Medication Sig Start Date End Date Taking? Authorizing Provider  RISPERDAL 1 MG tablet Take 1 tablet (1 mg total) by mouth at bedtime. 02/06/21   Roxy Horseman, PA-C  amantadine (SYMMETREL) 100 MG capsule Take 1 capsule (100 mg total) by mouth 2 (two) times daily. Patient not taking: Reported on 11/04/2019 09/01/19 11/17/19  [name], NP    Allergies    Haldol [haloperidol]  Review of Systems   Review of Systems  Constitutional: Negative for chills and fever.  Respiratory: Negative for shortness of breath.   Cardiovascular: Negative for chest pain.  Gastrointestinal: Negative for abdominal pain and vomiting.  Neurological: Negative for syncope.  Psychiatric/Behavioral: Negative for hallucinations and suicidal ideas.  All other systems reviewed and are negative.   Physical Exam Updated Vital Signs BP 129/69 (BP Location: Right Arm)   Pulse (!) 108   Temp 99.2 F (37.3 C) (Oral)   Resp 18   Ht 5\' 10"  (1.778 m)   Wt 99.8 kg   SpO2 97%   BMI 31.57 kg/m   Physical Exam Vitals and nursing note reviewed.  Constitutional:      General: He is not in acute distress.    Appearance: He is well-developed. He is not toxic-appearing.  HENT:  Head: Normocephalic and atraumatic.  Eyes:     General:        Right eye: No discharge.        Left eye: No discharge.     Conjunctiva/sclera: Conjunctivae normal.  Cardiovascular:     Rate and Rhythm: Normal rate and regular rhythm.  Pulmonary:     Effort: Pulmonary effort is normal. No respiratory distress.     Breath sounds: Normal breath sounds. No wheezing, rhonchi or rales.  Abdominal:     General: There is no distension.     Palpations: Abdomen is soft.     Tenderness: There is no abdominal tenderness.  Musculoskeletal:     Cervical back: Neck supple.  Skin:     General: Skin is warm and dry.     Findings: No rash.  Neurological:     Mental Status: He is alert.     Comments: Clear speech.   Psychiatric:        Behavior: Behavior normal. Behavior is cooperative.        Thought Content: Thought content does not include homicidal or suicidal ideation.     ED Results / Procedures / Treatments   Labs (all labs ordered are listed, but only abnormal results are displayed) Labs Reviewed - No data to display  EKG None  Radiology No results found.  Procedures Procedures   Medications Ordered in ED Medications  risperiDONE (RISPERDAL) tablet 1 mg (has no administration in time range)    ED Course  I have reviewed the triage vital signs and the nursing notes.  Pertinent labs & imaging results that were available during my care of the patient were reviewed by me and considered in my medical decision making (see chart for details).    MDM Rules/Calculators/A&P                          Patient presents to the emergency department requesting a dose of his Risperdal.  He has no complaints.  His initial tachycardia resolved on my exam.  Chart review for additional history.  Patient with multiple similar visits in the past.  He denies SI, HI, or hallucinations at this time.  He will be given a dose of his Risperdal.  Appears appropriate for discharge.   Final Clinical Impression(s) / ED Diagnoses Final diagnoses:  None    Rx / DC Orders ED Discharge Orders    None       Cherly Anderson, PA-C 03/02/21 2241    Cherly Anderson, PA-C 03/02/21 2241    Jacalyn Lefevre, MD 03/02/21 2256

## 2021-03-02 NOTE — Discharge Instructions (Addendum)
You were given a dose of your Risperdal in the emergency department  Please take this as previously prescribed.  Follow-up with primary care within 1 week.  Return to the ER for new or worsening symptoms or any other concerns.

## 2021-03-02 NOTE — ED Triage Notes (Signed)
Emergency Medicine Provider Triage Evaluation Note  Schylar Wuebker , a 35 y.o. male  was evaluated in triage.  Pt complains of needing a dose of his risperdal.  He denies other complaints or concerns to me.   Physical Exam  BP 129/69 (BP Location: Right Arm)   Pulse (!) 108   Temp 99.2 F (37.3 C) (Oral)   Resp 18   SpO2 97%  Patient is awake and alert.  Answers questions with out slurred speech or difficulty.  Moves all extremities spontaneously.    Medical Decision Making  Medically screening exam initiated at 9:56 PM.  Appropriate orders placed.  Leshawn Straka was informed that the remainder of the evaluation will be completed by another provider, this initial triage assessment does not replace that evaluation, and the importance of remaining in the ED until their evaluation is complete.    Cristina Gong, [place] 03/02/21 2159

## 2021-03-02 NOTE — ED Triage Notes (Signed)
Pt requesting Risperdal dose. Denies si/hi or hallucinations.

## 2021-03-05 ENCOUNTER — Emergency Department (HOSPITAL_COMMUNITY)
Admission: EM | Admit: 2021-03-05 | Discharge: 2021-03-06 | Disposition: A | Discharge status: Home / Self Care | Payer: Self-pay | Attending: Emergency Medicine | Admitting: Emergency Medicine

## 2021-03-05 ENCOUNTER — Other Ambulatory Visit: Payer: Self-pay

## 2021-03-05 ENCOUNTER — Encounter (HOSPITAL_COMMUNITY): Payer: Self-pay | Admitting: Emergency Medicine

## 2021-03-05 DIAGNOSIS — F17210 Nicotine dependence, cigarettes, uncomplicated: Secondary | ICD-10-CM | POA: Insufficient documentation

## 2021-03-05 DIAGNOSIS — R519 Headache, unspecified: Secondary | ICD-10-CM | POA: Insufficient documentation

## 2021-03-05 DIAGNOSIS — Z8616 Personal history of COVID-19: Secondary | ICD-10-CM | POA: Insufficient documentation

## 2021-03-05 MED ORDER — NAPROXEN 500 MG PO TABS
500.0000 mg | ORAL_TABLET | Freq: Once | ORAL | Status: AC
  Administered 2021-03-06: 500 mg via ORAL
  Filled 2021-03-05: qty 1

## 2021-03-05 NOTE — ED Provider Notes (Signed)
[facility]-EMERGENCY DEPT Provider Note   CSN: [id number] Arrival date & time: 03/05/21  2321     History Chief Complaint  Patient presents with  . Headache    Cole Barrett is a 35 y.o. male presents to the Emergency Department complaining of gradual, persistent, progressively worsening generalized headache onset 1 hour ago. No treatments PTA.  Nothing makes the headache better or worse. Denies falls, trauma, thunderclap headache.  Pt is not taking any anticoagulants.  Denies blurred vision, numbness or weakness.  Denies drugs and alcohol tonight.  Pt has taken his risperdal tonight as prescribed.    The history is provided by the patient.  Headache Associated symptoms: no abdominal pain, no back pain, no cough, no diarrhea, no fatigue, no fever, no nausea, no neck stiffness and no vomiting        Past Medical History:  Diagnosis Date  . COVID-19   . Homelessness   . Hypercholesterolemia   . Mental disorder   . Paranoid behavior (HCC)   . PE (pulmonary embolism)   . Schizophrenia [facility])     Patient Active Problem List   Diagnosis Date Noted  . Acute psychosis (HCC) 08/31/2019  . Schizophrenia (HCC) 08/31/2019  . Schizoaffective disorder, bipolar type (HCC) 01/19/2019  . Alcohol abuse   . Auditory hallucinations   . Alcohol abuse with alcohol-induced mood disorder (HCC) 09/08/2016  . Homelessness 09/03/2016  . Person feigning illness 09/03/2016  . 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 Cannabis use disorder, mild, abuse 08/29/2016  . Pulmonary emboli (HCC) 07/13/2016  . Adjustment disorder with emotional disturbance 03/12/2014  . Pulmonary embolism and infarction (HCC) 01/02/2014  . Acute left flank pain 01/02/2014  . Pulmonary embolism, bilateral (HCC) 01/02/2014    History reviewed. No pertinent surgical history.     Family History  Problem Relation Age of Onset  . Hypertension Mother     Social History   Tobacco  Use  . Smoking status: Current Every Day Smoker    Packs/day: 0.50    Types: Cigarettes  . Smokeless tobacco: Never Used  Vaping Use  . Vaping Use: Never used  Substance Use Topics  . Alcohol use: Yes    Comment: BAC not available  . Drug use: Yes    Types: Marijuana    Comment: Last used: 2 months ago UDS NA    Home Medications Prior to Admission medications   Medication Sig Start Date End Date Taking? Authorizing Provider  RISPERDAL 1 MG tablet Take 1 tablet (1 mg total) by mouth at bedtime. Patient not taking: Reported on 03/05/2021 02/06/21   Roxy Horseman, PA-C  amantadine (SYMMETREL) 100 MG capsule Take 1 capsule (100 mg total) by mouth 2 (two) times daily. Patient not taking: Reported on 11/04/2019 09/01/19 11/17/19  [name], NP    Allergies    Haldol [haloperidol]  Review of Systems   Review of Systems  Constitutional: Negative for appetite change, diaphoresis, fatigue, fever and unexpected weight change.  HENT: Negative for mouth sores.   Eyes: Negative for visual disturbance.  Respiratory: Negative for cough, chest tightness, shortness of breath and wheezing.   Cardiovascular: Negative for chest pain.  Gastrointestinal: Negative for abdominal pain, constipation, diarrhea, nausea and vomiting.  Endocrine: Negative for polydipsia, polyphagia and polyuria.  Genitourinary: Negative for dysuria, frequency, hematuria and urgency.  Musculoskeletal: Negative for back pain and neck stiffness.  Skin: Negative for rash.  Allergic/Immunologic: Negative for immunocompromised state.  Neurological: Positive for headaches.  Negative for syncope and light-headedness.  Hematological: Does not bruise/bleed easily.  Psychiatric/Behavioral: Negative for sleep disturbance. The patient is not nervous/anxious.     Physical Exam Updated Vital Signs BP (!) 150/91 (BP Location: Left Arm)   Pulse 96   Temp 98 F (36.7 C) (Oral)   Resp 20   Ht 5\' 10"  (1.778 m)   Wt 99.8 kg    SpO2 98%   BMI 31.57 kg/m   Physical Exam Vitals and nursing note reviewed.  Constitutional:      General: He is not in acute distress.    Appearance: He is well-developed. He is not diaphoretic.  HENT:     Head: Normocephalic and atraumatic.  Eyes:     General: No scleral icterus.    Conjunctiva/sclera: Conjunctivae normal.     Pupils: Pupils are equal, round, and reactive to light.     Comments: No horizontal, vertical or rotational nystagmus  Neck:     Comments: Full active and passive ROM without pain No midline or paraspinal tenderness No nuchal rigidity or meningeal signs Cardiovascular:     Rate and Rhythm: Normal rate and regular rhythm.  Pulmonary:     Effort: Pulmonary effort is normal. No respiratory distress.     Breath sounds: No wheezing or rales.  Abdominal:     Palpations: Abdomen is soft.     Tenderness: There is no abdominal tenderness. There is no guarding or rebound.  Musculoskeletal:        General: Normal range of motion.     Cervical back: Normal range of motion and neck supple.  Lymphadenopathy:     Cervical: No cervical adenopathy.  Skin:    General: Skin is warm and dry.     Findings: No rash.  Neurological:     Mental Status: He is alert and oriented to person, place, and time.     Cranial Nerves: No cranial nerve deficit.     Motor: No abnormal muscle tone.     Coordination: Coordination normal.     Comments: Mental Status:  Alert, oriented, thought content appropriate. Speech fluent without evidence of aphasia. Able to follow 2 step commands without difficulty.  Cranial Nerves:  II:  Peripheral visual fields grossly normal, pupils equal, round, reactive to light III,IV, VI: ptosis not present, extra-ocular motions intact bilaterally  V,VII: smile symmetric, facial light touch sensation equal VIII: hearing grossly normal bilaterally  IX,X: midline uvula rise  XI: bilateral shoulder shrug equal and strong XII: midline tongue extension   Motor:  5/5 in upper and lower extremities bilaterally including strong and equal grip strength and dorsiflexion/plantar flexion Sensory: Pinprick and light touch normal in all extremities.  Cerebellar: normal finger-to-nose with bilateral upper extremities Gait: normal gait and balance CV: distal pulses palpable throughout   Psychiatric:        Behavior: Behavior normal.        Thought Content: Thought content normal.        Judgment: Judgment normal.     ED Results / Procedures / Treatments    Procedures Procedures   Medications Ordered in ED Medications  naproxen (NAPROSYN) tablet 500 mg (500 mg Oral Given 03/06/21 0008)    ED Course  I have reviewed the triage vital signs and the nursing notes.  Pertinent labs & imaging results that were available during my care of the patient were reviewed by me and considered in my medical decision making (see chart for details).  Clinical Course as of 03/06/21  9702  Fri Mar 05, 2021  2359 BP(!): 150/91 Slightly higher than usual [HM]    Clinical Course User Index [HM] Hallie Ertl, Boyd Kerbs   MDM Rules/Calculators/A&P                           Patient, well-known to the emergency department presents with headache.  He does not normally get headaches.  Denies trauma.  Well-appearing with normal neurologic exam.  Mild hypertension on arrival, slightly higher than his usual.  Naproxen given.  12:37 AM Patient reports he starting to feel little bit better.  Repeat blood pressure improved.  Patient will be discharged home.  Discussed reasons return to the emergency department.  Patient states understanding and is in agreement with the plan.  BP 136/86   Pulse 91   Temp 98.6 F (37 C)   Resp 20   Ht 5\' 10"  (1.778 m)   Wt 99.8 kg   SpO2 98%   BMI 31.57 kg/m    Final Clinical Impression(s) / ED Diagnoses Final diagnoses:  Generalized headache    Rx / DC Orders ED Discharge Orders    None        Mardene Sayer 03/06/21 0037    05/06/21, MD 03/07/21 [phone]

## 2021-03-05 NOTE — ED Triage Notes (Signed)
Pt reports a headache that started about an hour ago. States that he doesn't have any medicine to take.

## 2021-03-06 ENCOUNTER — Emergency Department (HOSPITAL_COMMUNITY)
Admission: EM | Admit: 2021-03-06 | Discharge: 2021-03-07 | Disposition: A | Discharge status: Home / Self Care | Payer: Self-pay | Attending: Emergency Medicine | Admitting: Emergency Medicine

## 2021-03-06 ENCOUNTER — Other Ambulatory Visit: Payer: Self-pay

## 2021-03-06 DIAGNOSIS — Z8616 Personal history of COVID-19: Secondary | ICD-10-CM | POA: Insufficient documentation

## 2021-03-06 DIAGNOSIS — R519 Headache, unspecified: Secondary | ICD-10-CM | POA: Insufficient documentation

## 2021-03-06 DIAGNOSIS — F17210 Nicotine dependence, cigarettes, uncomplicated: Secondary | ICD-10-CM | POA: Insufficient documentation

## 2021-03-06 NOTE — Discharge Instructions (Addendum)
1. Medications: tylenol or motrin for headache, usual home medications 2. Treatment: rest, drink plenty of fluids,  3. Follow Up: Please followup with your primary doctor in 2-3 days for discussion of your diagnoses and further evaluation after today's visit; if you do not have a primary care doctor use the resource guide provided to find one; Please return to the ER for worsening pain, changes in vision, fever or other concerns

## 2021-03-07 MED ORDER — ACETAMINOPHEN 500 MG PO TABS
1000.0000 mg | ORAL_TABLET | Freq: Once | ORAL | Status: AC
  Administered 2021-03-07: 1000 mg via ORAL
  Filled 2021-03-07: qty 2

## 2021-03-07 NOTE — ED Provider Notes (Signed)
[facility]-EMERGENCY DEPT Provider Note   CSN: [id number] Arrival date & time: 03/06/21  2330     History Chief Complaint  Patient presents with  . Headache    Headache since yesterday.    Cole Barrett is a 35 y.o. male.   Headache Pain location:  Generalized Quality:  Dull Radiates to:  Does not radiate Severity currently:  5/10 Severity at highest:  5/10 Onset quality:  Gradual Timing:  Constant Progression:  Worsening Similar to prior headaches: yes   Context: not activity, not defecating and not eating        Past Medical History:  Diagnosis Date  . COVID-19   . Homelessness   . Hypercholesterolemia   . Mental disorder   . Paranoid behavior (HCC)   . PE (pulmonary embolism)   . Schizophrenia [facility])     Patient Active Problem List   Diagnosis Date Noted  . Acute psychosis (HCC) 08/31/2019  . Schizophrenia (HCC) 08/31/2019  . Schizoaffective disorder, bipolar type (HCC) 01/19/2019  . Alcohol abuse   . Auditory hallucinations   . Alcohol abuse with alcohol-induced mood disorder (HCC) 09/08/2016  . Homelessness 09/03/2016  . Person feigning illness 09/03/2016  . 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 Cannabis use disorder, mild, abuse 08/29/2016  . Pulmonary emboli (HCC) 07/13/2016  . Adjustment disorder with emotional disturbance 03/12/2014  . Pulmonary embolism and infarction (HCC) 01/02/2014  . Acute left flank pain 01/02/2014  . Pulmonary embolism, bilateral (HCC) 01/02/2014    No past surgical history on file.     Family History  Problem Relation Age of Onset  . Hypertension Mother     Social History   Tobacco Use  . Smoking status: Current Every Day Smoker    Packs/day: 0.50    Types: Cigarettes  . Smokeless tobacco: Never Used  Vaping Use  . Vaping Use: Never used  Substance Use Topics  . Alcohol use: Yes    Comment: BAC not available  . Drug use: Yes    Types: Marijuana    Comment: Last  used: 2 months ago UDS NA    Home Medications Prior to Admission medications   Medication Sig Start Date End Date Taking? Authorizing Provider  RISPERDAL 1 MG tablet Take 1 tablet (1 mg total) by mouth at bedtime. Patient not taking: Reported on 03/05/2021 02/06/21   Roxy Horseman, PA-C  amantadine (SYMMETREL) 100 MG capsule Take 1 capsule (100 mg total) by mouth 2 (two) times daily. Patient not taking: Reported on 11/04/2019 09/01/19 11/17/19  [name], NP    Allergies    Haldol [haloperidol]  Review of Systems   Review of Systems  Neurological: Positive for headaches.  All other systems reviewed and are negative.   Physical Exam Updated Vital Signs BP 132/79 (BP Location: Right Arm)   Pulse 100   Temp 98.7 F (37.1 C) (Oral)   Resp 16   Ht 5\' 10"  (1.778 m)   Wt 99.8 kg   SpO2 100%   BMI 31.57 kg/m   Physical Exam Vitals and nursing note reviewed.  Constitutional:      Appearance: He is well-developed.  HENT:     Head: Normocephalic and atraumatic.     Mouth/Throat:     Mouth: Mucous membranes are moist.     Pharynx: Oropharynx is clear.  Eyes:     Conjunctiva/sclera: Conjunctivae normal.     Pupils: Pupils are equal, round, and reactive to light.  Cardiovascular:  Rate and Rhythm: Normal rate.  Pulmonary:     Effort: Pulmonary effort is normal. No respiratory distress.  Abdominal:     General: Abdomen is flat. There is no distension.  Musculoskeletal:        General: Normal range of motion.     Cervical back: Normal range of motion.  Skin:    General: Skin is warm and dry.     Coloration: Skin is not jaundiced or pale.  Neurological:     General: No focal deficit present.     Mental Status: He is alert.     ED Results / Procedures / Treatments   Labs (all labs ordered are listed, but only abnormal results are displayed) Labs Reviewed - No data to display  EKG None  Radiology No results found.  Procedures Procedures   Medications  Ordered in ED Medications  acetaminophen (TYLENOL) tablet 1,000 mg (has no administration in time range)    ED Course  I have reviewed the triage vital signs and the nursing notes.  Pertinent labs & imaging results that were available during my care of the patient were reviewed by me and considered in my medical decision making (see chart for details).    MDM Rules/Calculators/A&P                          Recurrent headache without red flags. Tylenol ordered.  Final Clinical Impression(s) / ED Diagnoses Final diagnoses:  Nonintractable headache, unspecified chronicity pattern, unspecified headache type    Rx / DC Orders ED Discharge Orders    None       Dniyah Grant, Barbara Cower, MD 03/07/21 0003

## 2021-03-08 ENCOUNTER — Other Ambulatory Visit: Payer: Self-pay

## 2021-03-08 ENCOUNTER — Emergency Department (HOSPITAL_COMMUNITY)
Admission: EM | Admit: 2021-03-08 | Discharge: 2021-03-08 | Disposition: A | Discharge status: Home / Self Care | Payer: Self-pay | Attending: Emergency Medicine | Admitting: Emergency Medicine

## 2021-03-08 ENCOUNTER — Encounter (HOSPITAL_COMMUNITY): Payer: Self-pay

## 2021-03-08 DIAGNOSIS — F17210 Nicotine dependence, cigarettes, uncomplicated: Secondary | ICD-10-CM | POA: Insufficient documentation

## 2021-03-08 DIAGNOSIS — Z760 Encounter for issue of repeat prescription: Secondary | ICD-10-CM | POA: Insufficient documentation

## 2021-03-08 DIAGNOSIS — Z8616 Personal history of COVID-19: Secondary | ICD-10-CM | POA: Insufficient documentation

## 2021-03-08 MED ORDER — RISPERIDONE 1 MG PO TABS
1.0000 mg | ORAL_TABLET | Freq: Once | ORAL | Status: AC
  Administered 2021-03-08: 1 mg via ORAL
  Filled 2021-03-08 (×2): qty 1

## 2021-03-08 NOTE — ED Notes (Signed)
E-signature pad unavailable at time of pt discharge. This RN discussed discharge materials with pt and answered all pt questions. Pt stated understanding of discharge material. ? ?

## 2021-03-08 NOTE — ED Provider Notes (Signed)
[name] EMERGENCY DEPARTMENT Provider Note   CSN: [id number] Arrival date & time: 03/08/21  0165     History Chief Complaint  Patient presents with  . needs medication    Cole Barrett is a 35 y.o. male.  The history is provided by the patient and medical records.    35 y.o. M with hx of homelessness, paranoid behavior, schizophrenia, presenting to the ED for his dose of risperdal.  Patient seen in our facilities nearly every night for similar.  He denies any current SI/HI.  Past Medical History:  Diagnosis Date  . COVID-19   . Homelessness   . Hypercholesterolemia   . Mental disorder   . Paranoid behavior (HCC)   . PE (pulmonary embolism)   . Schizophrenia [facility])     Patient Active Problem List   Diagnosis Date Noted  . Acute psychosis (HCC) 08/31/2019  . Schizophrenia (HCC) 08/31/2019  . Schizoaffective disorder, bipolar type (HCC) 01/19/2019  . Alcohol abuse   . Auditory hallucinations   . Alcohol abuse with alcohol-induced mood disorder (HCC) 09/08/2016  . Homelessness 09/03/2016  . Person feigning illness 09/03/2016  . 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 Cannabis use disorder, mild, abuse 08/29/2016  . Pulmonary emboli (HCC) 07/13/2016  . Adjustment disorder with emotional disturbance 03/12/2014  . Pulmonary embolism and infarction (HCC) 01/02/2014  . Acute left flank pain 01/02/2014  . Pulmonary embolism, bilateral (HCC) 01/02/2014    History reviewed. No pertinent surgical history.     Family History  Problem Relation Age of Onset  . Hypertension Mother     Social History   Tobacco Use  . Smoking status: Current Every Day Smoker    Packs/day: 0.50    Types: Cigarettes  . Smokeless tobacco: Never Used  Vaping Use  . Vaping Use: Never used  Substance Use Topics  . Alcohol use: Yes    Comment: BAC not available  . Drug use: Yes    Types: Marijuana    Comment: Last used: 2 months ago UDS NA     Home Medications Prior to Admission medications   Medication Sig Start Date End Date Taking? Authorizing Provider  RISPERDAL 1 MG tablet Take 1 tablet (1 mg total) by mouth at bedtime. Patient not taking: Reported on 03/05/2021 02/06/21   Roxy Horseman, PA-C  amantadine (SYMMETREL) 100 MG capsule Take 1 capsule (100 mg total) by mouth 2 (two) times daily. Patient not taking: Reported on 11/04/2019 09/01/19 11/17/19  [name], NP    Allergies    Haldol [haloperidol]  Review of Systems   Review of Systems  Psychiatric/Behavioral:       Medication needs  All other systems reviewed and are negative.   Physical Exam Updated Vital Signs BP 117/78 (BP Location: Left Arm)   Pulse 83   Temp 97.7 F (36.5 C) (Oral)   Resp 17   Ht 5\' 10"  (1.778 m)   Wt 99.8 kg   SpO2 97%   BMI 31.57 kg/m   Physical Exam Vitals and nursing note reviewed.  Constitutional:      Appearance: He is well-developed.     Comments: Sleeping soundly, NAD, awakes easily for exam  HENT:     Head: Normocephalic and atraumatic.  Eyes:     Conjunctiva/sclera: Conjunctivae normal.     Pupils: Pupils are equal, round, and reactive to light.  Cardiovascular:     Rate and Rhythm: Normal rate and regular rhythm.  Heart sounds: Normal heart sounds.  Pulmonary:     Effort: Pulmonary effort is normal.     Breath sounds: Normal breath sounds.  Abdominal:     General: Bowel sounds are normal.     Palpations: Abdomen is soft.  Musculoskeletal:        General: Normal range of motion.     Cervical back: Normal range of motion.  Skin:    General: Skin is warm and dry.  Neurological:     Mental Status: He is alert and oriented to person, place, and time.  Psychiatric:     Comments: Denies SI/HI     ED Results / Procedures / Treatments   Labs (all labs ordered are listed, but only abnormal results are displayed) Labs Reviewed - No data to display  EKG None  Radiology No results  found.  Procedures Procedures   Medications Ordered in ED Medications  risperiDONE (RISPERDAL) tablet 1 mg (1 mg Oral Given 03/08/21 0303)    ED Course  I have reviewed the triage vital signs and the nursing notes.  Pertinent labs & imaging results that were available during my care of the patient were reviewed by me and considered in my medical decision making (see chart for details).    MDM Rules/Calculators/A&P  35 y.o. M here for his dose of risperdal.  He is well known to our facilities with nearly nightly visits for same.  He denies SI/HI.  He was given his risperdal and allowed to rest for a few hours.  Stable for discharge with OP follow-up.  Can return here for new concerns.  Final Clinical Impression(s) / ED Diagnoses Final diagnoses:  Medication refill    Rx / DC Orders ED Discharge Orders    None       Garlon Hatchet, PA-C 03/08/21 0536    [name], MD 03/08/21 [phone]

## 2021-03-08 NOTE — ED Triage Notes (Signed)
Pt states he is out of his risperdal and was over here tonight so wanted to get some. Pt denies pain. Pt denies SI/HI. Denies auditory or visual hallucinations. Pt ambulatory to triage, NAD.

## 2021-03-10 ENCOUNTER — Encounter (HOSPITAL_COMMUNITY): Payer: Self-pay

## 2021-03-10 ENCOUNTER — Other Ambulatory Visit: Payer: Self-pay

## 2021-03-10 ENCOUNTER — Emergency Department (HOSPITAL_COMMUNITY)
Admission: EM | Admit: 2021-03-10 | Discharge: 2021-03-10 | Disposition: A | Discharge status: Home / Self Care | Payer: Self-pay | Attending: Emergency Medicine | Admitting: Emergency Medicine

## 2021-03-10 DIAGNOSIS — Z760 Encounter for issue of repeat prescription: Secondary | ICD-10-CM | POA: Insufficient documentation

## 2021-03-10 DIAGNOSIS — F17210 Nicotine dependence, cigarettes, uncomplicated: Secondary | ICD-10-CM | POA: Insufficient documentation

## 2021-03-10 DIAGNOSIS — Z8616 Personal history of COVID-19: Secondary | ICD-10-CM | POA: Insufficient documentation

## 2021-03-10 MED ORDER — RISPERIDONE 1 MG PO TABS
1.0000 mg | ORAL_TABLET | Freq: Once | ORAL | Status: AC
  Administered 2021-03-10: 1 mg via ORAL
  Filled 2021-03-10: qty 1

## 2021-03-10 NOTE — ED Triage Notes (Signed)
Pt states he needs his does of risperdal. Pt denies SI, denies HI.Pt has no other complaints at this time.

## 2021-03-10 NOTE — ED Provider Notes (Signed)
[facility]-EMERGENCY DEPT Provider Note  CSN: [id number] Arrival date & time: 03/10/21 2241  Chief Complaint(s) Medication Refill  HPI Cole Barrett is a 35 y.o. male with h/o homelessness, paranoid behavior, schizophrenia, presenting to the ED for his dose of risperdal.  Patient seen in our facilities nearly every night for similar.  He denies any current SI/HI. Chronic auditory hallucination, but not commanding.  HPI  Past Medical History Past Medical History:  Diagnosis Date  . COVID-19   . Homelessness   . Hypercholesterolemia   . Mental disorder   . Paranoid behavior (HCC)   . PE (pulmonary embolism)   . Schizophrenia [facility])    Patient Active Problem List   Diagnosis Date Noted  . Acute psychosis (HCC) 08/31/2019  . Schizophrenia (HCC) 08/31/2019  . Schizoaffective disorder, bipolar type (HCC) 01/19/2019  . Alcohol abuse   . Auditory hallucinations   . Alcohol abuse with alcohol-induced mood disorder (HCC) 09/08/2016  . Homelessness 09/03/2016  . Person feigning illness 09/03/2016  . 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 Cannabis use disorder, mild, abuse 08/29/2016  . Pulmonary emboli (HCC) 07/13/2016  . Adjustment disorder with emotional disturbance 03/12/2014  . Pulmonary embolism and infarction (HCC) 01/02/2014  . Acute left flank pain 01/02/2014  . Pulmonary embolism, bilateral (HCC) 01/02/2014   Home Medication(s) Prior to Admission medications   Medication Sig Start Date End Date Taking? Authorizing Provider  RISPERDAL 1 MG tablet Take 1 tablet (1 mg total) by mouth at bedtime. Patient not taking: Reported on 03/05/2021 02/06/21   Roxy Horseman, PA-C  amantadine (SYMMETREL) 100 MG capsule Take 1 capsule (100 mg total) by mouth 2 (two) times daily. Patient not taking: Reported on 11/04/2019 09/01/19 11/17/19  [name], NP                                                                                                                                     Past Surgical History History reviewed. No pertinent surgical history. Family History Family History  Problem Relation Age of Onset  . Hypertension Mother     Social History Social History   Tobacco Use  . Smoking status: Current Every Day Smoker    Packs/day: 0.50    Types: Cigarettes  . Smokeless tobacco: Never Used  Vaping Use  . Vaping Use: Never used  Substance Use Topics  . Alcohol use: Yes    Comment: BAC not available  . Drug use: Yes    Types: Marijuana    Comment: Last used: 2 months ago UDS NA   Allergies Haldol [haloperidol]  Review of Systems Review of Systems All other systems are reviewed and are negative for acute change except as noted in the HPI  Physical Exam Vital Signs  I have reviewed the triage vital signs BP 137/68   Pulse 80   Temp 98.5 F (36.9 C) (Oral)   Resp 18  SpO2 99%   Physical Exam Vitals reviewed.  Constitutional:      General: He is not in acute distress.    Appearance: He is well-developed. He is not diaphoretic.  HENT:     Head: Normocephalic and atraumatic.     Jaw: No trismus.     Right Ear: External ear normal.     Left Ear: External ear normal.     Nose: Nose normal.  Eyes:     General: No scleral icterus.    Conjunctiva/sclera: Conjunctivae normal.  Neck:     Trachea: Phonation normal.  Cardiovascular:     Rate and Rhythm: Normal rate and regular rhythm.  Pulmonary:     Effort: Pulmonary effort is normal. No respiratory distress.     Breath sounds: No stridor.  Abdominal:     General: There is no distension.  Musculoskeletal:        General: Normal range of motion.     Cervical back: Normal range of motion.  Neurological:     Mental Status: He is alert and oriented to person, place, and time.  Psychiatric:        Behavior: Behavior normal.     ED Results and Treatments Labs (all labs ordered are listed, but only abnormal results are displayed) Labs Reviewed -  No data to display                                                                                                                       EKG  EKG Interpretation  Date/Time:    Ventricular Rate:    PR Interval:    QRS Duration:   QT Interval:    QTC Calculation:   R Axis:     Text Interpretation:        Radiology No results found.  Pertinent labs & imaging results that were available during my care of the patient were reviewed by me and considered in my medical decision making (see chart for details).  Medications Ordered in ED Medications  risperiDONE (RISPERDAL) tablet 1 mg (has no administration in time range)                                                                                                                                    Procedures Procedures  (including critical care time)  Medical Decision Making / ED Course I have reviewed the nursing notes for this encounter  and the patient's prior records (if available in EHR or on provided paperwork).   Cole Barrett was evaluated in Emergency Department on 03/10/2021 for the symptoms described in the history of present illness. He was evaluated in the context of the global COVID-19 pandemic, which necessitated consideration that the patient might be at risk for infection with the SARS-CoV-2 virus that causes COVID-19. Institutional protocols and algorithms that pertain to the evaluation of patients at risk for COVID-19 are in a state of rapid change based on information released by regulatory bodies including the CDC and federal and state organizations. These policies and algorithms were followed during the patient's care in the ED.  Given home dose of risperidal.       Final Clinical Impression(s) / ED Diagnoses Final diagnoses:  Medication refill   The patient appears reasonably screened and/or stabilized for discharge and I doubt any other medical condition or other [facility] requiring further screening,  evaluation, or treatment in the ED at this time prior to discharge. Safe for discharge with strict return precautions.  Disposition: Discharge  Condition: Good  I have discussed the results, Dx and Tx plan with the patient/family who expressed understanding and agree(s) with the plan. Discharge instructions discussed at length. The patient/family was given strict return precautions who verbalized understanding of the instructions. No further questions at time of discharge.    ED Discharge Orders    None        Follow Up: Primary care provider  Schedule an appointment as soon as possible for a visit        This chart was dictated using voice recognition software.  Despite best efforts to proofread,  errors can occur which can change the documentation meaning.   [name], MD 03/10/21 [phone]

## 2021-03-12 ENCOUNTER — Other Ambulatory Visit: Payer: Self-pay

## 2021-03-12 ENCOUNTER — Emergency Department (HOSPITAL_COMMUNITY)
Admission: EM | Admit: 2021-03-12 | Discharge: 2021-03-13 | Disposition: A | Discharge status: Home / Self Care | Payer: Self-pay | Attending: Emergency Medicine | Admitting: Emergency Medicine

## 2021-03-12 DIAGNOSIS — Z8616 Personal history of COVID-19: Secondary | ICD-10-CM | POA: Insufficient documentation

## 2021-03-12 DIAGNOSIS — F17210 Nicotine dependence, cigarettes, uncomplicated: Secondary | ICD-10-CM | POA: Insufficient documentation

## 2021-03-12 DIAGNOSIS — Z760 Encounter for issue of repeat prescription: Secondary | ICD-10-CM | POA: Insufficient documentation

## 2021-03-12 NOTE — ED Triage Notes (Signed)
Patient presents requesting a dose of risperdal and a prescription. Patient denies SI/HI.

## 2021-03-13 ENCOUNTER — Encounter (HOSPITAL_COMMUNITY): Payer: Self-pay | Admitting: Emergency Medicine

## 2021-03-13 MED ORDER — RISPERDAL 1 MG PO TABS
1.0000 mg | ORAL_TABLET | Freq: Every day | ORAL | 0 refills | Status: DC
Start: 2021-03-13 — End: 2021-05-13

## 2021-03-13 MED ORDER — RISPERIDONE 1 MG PO TABS
1.0000 mg | ORAL_TABLET | Freq: Once | ORAL | Status: AC
  Administered 2021-03-13: 1 mg via ORAL
  Filled 2021-03-13: qty 1

## 2021-03-13 NOTE — ED Provider Notes (Signed)
[facility]-EMERGENCY DEPT Provider Note   CSN: [id number] Arrival date & time: 03/12/21  2344     History Chief Complaint  Patient presents with  . Medication Refill    Cole Barrett is a 35 y.o. male presents to the Emergency Department requesting his daily dose of Risperdal and a prescription for same.  Denies any other complaints, aggravating or alleviating factors.  Patient reports he will fill his prescription in the morning.  The history is provided by the patient and medical records. No language interpreter was used.       Past Medical History:  Diagnosis Date  . COVID-19   . Homelessness   . Hypercholesterolemia   . Mental disorder   . Paranoid behavior (HCC)   . PE (pulmonary embolism)   . Schizophrenia [facility])     Patient Active Problem List   Diagnosis Date Noted  . Acute psychosis (HCC) 08/31/2019  . Schizophrenia (HCC) 08/31/2019  . Schizoaffective disorder, bipolar type (HCC) 01/19/2019  . Alcohol abuse   . Auditory hallucinations   . Alcohol abuse with alcohol-induced mood disorder (HCC) 09/08/2016  . Homelessness 09/03/2016  . Person feigning illness 09/03/2016  . 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 Cannabis use disorder, mild, abuse 08/29/2016  . Pulmonary emboli (HCC) 07/13/2016  . Adjustment disorder with emotional disturbance 03/12/2014  . Pulmonary embolism and infarction (HCC) 01/02/2014  . Acute left flank pain 01/02/2014  . Pulmonary embolism, bilateral (HCC) 01/02/2014    History reviewed. No pertinent surgical history.     Family History  Problem Relation Age of Onset  . Hypertension Mother     Social History   Tobacco Use  . Smoking status: Current Every Day Smoker    Packs/day: 0.50    Types: Cigarettes  . Smokeless tobacco: Never Used  Vaping Use  . Vaping Use: Never used  Substance Use Topics  . Alcohol use: Yes    Comment: BAC not available  . Drug use: Yes    Types:  Marijuana    Comment: Last used: 2 months ago UDS NA    Home Medications Prior to Admission medications   Medication Sig Start Date End Date Taking? Authorizing Provider  RISPERDAL 1 MG tablet Take 1 tablet (1 mg total) by mouth at bedtime. 03/13/21 04/12/21  Dinnis Rog, Dahlia Client, PA-C  amantadine (SYMMETREL) 100 MG capsule Take 1 capsule (100 mg total) by mouth 2 (two) times daily. Patient not taking: Reported on 11/04/2019 09/01/19 11/17/19  [name], NP    Allergies    Haldol [haloperidol]  Review of Systems   Review of Systems  Constitutional: Negative for fatigue and fever.  HENT: Negative for congestion.   Respiratory: Negative for chest tightness and shortness of breath.   Cardiovascular: Negative for chest pain.  Gastrointestinal: Negative for abdominal pain, nausea and vomiting.  Neurological: Negative for headaches.    Physical Exam Updated Vital Signs BP (!) 151/96   Pulse 72   Temp 98 F (36.7 C)   Resp 20   SpO2 98%   Physical Exam Vitals and nursing note reviewed.  Constitutional:      General: He is not in acute distress.    Appearance: He is well-developed.  HENT:     Head: Normocephalic.  Eyes:     General: No scleral icterus.    Conjunctiva/sclera: Conjunctivae normal.  Cardiovascular:     Rate and Rhythm: Normal rate.  Pulmonary:     Effort: Pulmonary effort is normal.  Musculoskeletal:        General: Normal range of motion.     Cervical back: Normal range of motion.  Skin:    General: Skin is warm and dry.  Neurological:     Mental Status: He is alert.     ED Results / Procedures / Treatments    Procedures Procedures   Medications Ordered in ED Medications  risperiDONE (RISPERDAL) tablet 1 mg (1 mg Oral Given 03/13/21 8144)    ED Course  I have reviewed the triage vital signs and the nursing notes.  Pertinent labs & imaging results that were available during my care of the patient were reviewed by me and considered in my  medical decision making (see chart for details).    MDM Rules/Calculators/A&P                           Patient presents for nightly Risperdal dose.  He is well-known to the emergency department.  Will give medications and refill prescription.  No other complaints or concerns.   Final Clinical Impression(s) / ED Diagnoses Final diagnoses:  Medication refill    Rx / DC Orders ED Discharge Orders         Ordered    RISPERDAL 1 MG tablet  Daily at bedtime        03/13/21 0101           Devaney Segers, Dahlia Client, PA-C 03/13/21 8185    Sabas Sous, MD 03/13/21 [phone]

## 2021-03-13 NOTE — Discharge Instructions (Addendum)
Your Risperdal has been refilled.  Please follow-up with your primary care provider for further refills.

## 2021-03-15 ENCOUNTER — Other Ambulatory Visit: Payer: Self-pay

## 2021-03-15 ENCOUNTER — Encounter (HOSPITAL_COMMUNITY): Payer: Self-pay | Admitting: Emergency Medicine

## 2021-03-15 ENCOUNTER — Emergency Department (HOSPITAL_COMMUNITY)
Admission: EM | Admit: 2021-03-15 | Discharge: 2021-03-15 | Disposition: A | Discharge status: Home / Self Care | Payer: Self-pay | Attending: Emergency Medicine | Admitting: Emergency Medicine

## 2021-03-15 ENCOUNTER — Encounter (HOSPITAL_COMMUNITY): Payer: Self-pay

## 2021-03-15 ENCOUNTER — Emergency Department (HOSPITAL_COMMUNITY): Payer: Self-pay

## 2021-03-15 DIAGNOSIS — F209 Schizophrenia, unspecified: Secondary | ICD-10-CM | POA: Insufficient documentation

## 2021-03-15 DIAGNOSIS — Y9289 Other specified places as the place of occurrence of the external cause: Secondary | ICD-10-CM | POA: Insufficient documentation

## 2021-03-15 DIAGNOSIS — Z8616 Personal history of COVID-19: Secondary | ICD-10-CM | POA: Insufficient documentation

## 2021-03-15 DIAGNOSIS — Z760 Encounter for issue of repeat prescription: Secondary | ICD-10-CM | POA: Insufficient documentation

## 2021-03-15 DIAGNOSIS — W228XXA Striking against or struck by other objects, initial encounter: Secondary | ICD-10-CM | POA: Insufficient documentation

## 2021-03-15 DIAGNOSIS — F17210 Nicotine dependence, cigarettes, uncomplicated: Secondary | ICD-10-CM | POA: Insufficient documentation

## 2021-03-15 DIAGNOSIS — Y990 Civilian activity done for income or pay: Secondary | ICD-10-CM | POA: Insufficient documentation

## 2021-03-15 DIAGNOSIS — S40022A Contusion of left upper arm, initial encounter: Secondary | ICD-10-CM | POA: Insufficient documentation

## 2021-03-15 MED ORDER — IBUPROFEN 200 MG PO TABS
600.0000 mg | ORAL_TABLET | Freq: Once | ORAL | Status: AC
  Administered 2021-03-15: 600 mg via ORAL
  Filled 2021-03-15: qty 3

## 2021-03-15 MED ORDER — IBUPROFEN 600 MG PO TABS: 600.0000 mg | ORAL_TABLET | Freq: Four times a day (QID) | ORAL | 0 refills | Status: DC | PRN

## 2021-03-15 MED ORDER — RISPERIDONE 1 MG PO TABS
1.0000 mg | ORAL_TABLET | ORAL | Status: AC
  Administered 2021-03-15: 1 mg via ORAL
  Filled 2021-03-15: qty 1

## 2021-03-15 NOTE — Discharge Instructions (Addendum)
Please take your home meds

## 2021-03-15 NOTE — ED Triage Notes (Signed)
Pt c/o left arm pain. Pt states he hit it on a pole today at work.

## 2021-03-15 NOTE — Discharge Instructions (Addendum)
Take the medications as needed for pain.  Try applying ice to help with swelling

## 2021-03-15 NOTE — ED Provider Notes (Signed)
[facility]-EMERGENCY DEPT Provider Note   CSN: [id number] Arrival date & time: 03/15/21  2229     History Chief Complaint  Patient presents with  . Left Arm Pain    Cole Barrett is a 35 y.o. male.  HPI   Patient presents to the ED for evaluation of left arm pain.  Patient states that he was at work today when he hit his arm on a pole.  Now having pain and swelling and tenderness.  He denies any numbness or weakness.  No other injuries.  He denies any wrist or elbow pain  Past Medical History:  Diagnosis Date  . COVID-19   . Homelessness   . Hypercholesterolemia   . Mental disorder   . Paranoid behavior (HCC)   . PE (pulmonary embolism)   . Schizophrenia [facility])     Patient Active Problem List   Diagnosis Date Noted  . Acute psychosis (HCC) 08/31/2019  . Schizophrenia (HCC) 08/31/2019  . Schizoaffective disorder, bipolar type (HCC) 01/19/2019  . Alcohol abuse   . Auditory hallucinations   . Alcohol abuse with alcohol-induced mood disorder (HCC) 09/08/2016  . Homelessness 09/03/2016  . Person feigning illness 09/03/2016  . 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 Cannabis use disorder, mild, abuse 08/29/2016  . Pulmonary emboli (HCC) 07/13/2016  . Adjustment disorder with emotional disturbance 03/12/2014  . Pulmonary embolism and infarction (HCC) 01/02/2014  . Acute left flank pain 01/02/2014  . Pulmonary embolism, bilateral (HCC) 01/02/2014    History reviewed. No pertinent surgical history.     Family History  Problem Relation Age of Onset  . Hypertension Mother     Social History   Tobacco Use  . Smoking status: Current Every Day Smoker    Packs/day: 0.50    Types: Cigarettes  . Smokeless tobacco: Never Used  Vaping Use  . Vaping Use: Never used  Substance Use Topics  . Alcohol use: Yes    Comment: BAC not available  . Drug use: Yes    Types: Marijuana    Comment: Last used: 2 months ago UDS NA    Home  Medications Prior to Admission medications   Medication Sig Start Date End Date Taking? Authorizing Provider  ibuprofen (ADVIL) 600 MG tablet Take 1 tablet (600 mg total) by mouth every 6 (six) hours as needed. 03/15/21  Yes Linwood Dibbles, MD  RISPERDAL 1 MG tablet Take 1 tablet (1 mg total) by mouth at bedtime. 03/13/21 04/12/21  Muthersbaugh, Dahlia Client, PA-C  amantadine (SYMMETREL) 100 MG capsule Take 1 capsule (100 mg total) by mouth 2 (two) times daily. Patient not taking: Reported on 11/04/2019 09/01/19 11/17/19  [name], NP    Allergies    Haldol [haloperidol]  Review of Systems   Review of Systems  All other systems reviewed and are negative.   Physical Exam Updated Vital Signs BP (!) 138/99 (BP Location: Right Arm)   Pulse (!) 116   Temp 97.8 F (36.6 C) (Oral)   Resp 18   Ht 1.778 m (5\' 10" )   Wt 108.9 kg   SpO2 100%   BMI 34.44 kg/m   Physical Exam Vitals and nursing note reviewed.  Constitutional:      General: He is not in acute distress.    Appearance: He is well-developed.  HENT:     Head: Normocephalic and atraumatic.     Right Ear: External ear normal.     Left Ear: External ear normal.  Eyes:  General: No scleral icterus.       Right eye: No discharge.        Left eye: No discharge.     Conjunctiva/sclera: Conjunctivae normal.  Neck:     Trachea: No tracheal deviation.  Cardiovascular:     Rate and Rhythm: Normal rate.  Pulmonary:     Effort: Pulmonary effort is normal. No respiratory distress.     Breath sounds: No stridor.  Abdominal:     General: There is no distension.  Musculoskeletal:        General: Tenderness present. No swelling or deformity.     Cervical back: Neck supple.     Comments: ttp mid left forearm  Skin:    General: Skin is warm and dry.     Findings: No rash.  Neurological:     Mental Status: He is alert.     Cranial Nerves: Cranial nerve deficit: no gross deficits.     ED Results / Procedures / Treatments    Labs (all labs ordered are listed, but only abnormal results are displayed) Labs Reviewed - No data to display  EKG None  Radiology DG Forearm Left  Result Date: 03/15/2021 CLINICAL DATA:  Blunt trauma to the forearm, initial encounter EXAM: LEFT FOREARM - 2 VIEW COMPARISON:  None. FINDINGS: There is no evidence of fracture or other focal bone lesions. Soft tissues are unremarkable. IMPRESSION: No acute abnormality noted. Electronically Signed   By: Alcide Clever M.D.   On: 03/15/2021 23:06    Procedures Procedures   Medications Ordered in ED Medications  ibuprofen (ADVIL) tablet 600 mg (600 mg Oral Given 03/15/21 2314)    ED Course  I have reviewed the triage vital signs and the nursing notes.  Pertinent labs & imaging results that were available during my care of the patient were reviewed by me and considered in my medical decision making (see chart for details).    MDM Rules/Calculators/A&P                          Xray without fx.  Consistent with contusion.  Ice, nsaids Final Clinical Impression(s) / ED Diagnoses Final diagnoses:  Contusion of left upper extremity, initial encounter    Rx / DC Orders ED Discharge Orders         Ordered    ibuprofen (ADVIL) 600 MG tablet  Every 6 hours PRN        03/15/21 2328           Linwood Dibbles, MD 03/15/21 2329

## 2021-03-15 NOTE — ED Provider Notes (Signed)
[name] EMERGENCY DEPARTMENT Provider Note   CSN: [id number] Arrival date & time: 03/15/21  0113     History Chief Complaint  Patient presents with  . Med Refill( Risperidal)    Cole Barrett is a 35 y.o. male.  HPI Patient is a 35 year old male with past medical history detailed below presented today for his daily dose of Risperdal.  He denies any SI, HI, AVH  Denies any other associate symptoms.  States he left his medication at home.  I have seen this patient previously before for similar medication refill.  He is frequently seen in the ER for similar complaints.  He has no interest in a printed prescription.     Past Medical History:  Diagnosis Date  . COVID-19   . Homelessness   . Hypercholesterolemia   . Mental disorder   . Paranoid behavior (HCC)   . PE (pulmonary embolism)   . Schizophrenia [facility])     Patient Active Problem List   Diagnosis Date Noted  . Acute psychosis (HCC) 08/31/2019  . Schizophrenia (HCC) 08/31/2019  . Schizoaffective disorder, bipolar type (HCC) 01/19/2019  . Alcohol abuse   . Auditory hallucinations   . Alcohol abuse with alcohol-induced mood disorder (HCC) 09/08/2016  . Homelessness 09/03/2016  . Person feigning illness 09/03/2016  . 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 Cannabis use disorder, mild, abuse 08/29/2016  . Pulmonary emboli (HCC) 07/13/2016  . Adjustment disorder with emotional disturbance 03/12/2014  . Pulmonary embolism and infarction (HCC) 01/02/2014  . Acute left flank pain 01/02/2014  . Pulmonary embolism, bilateral (HCC) 01/02/2014    History reviewed. No pertinent surgical history.     Family History  Problem Relation Age of Onset  . Hypertension Mother     Social History   Tobacco Use  . Smoking status: Current Every Day Smoker    Packs/day: 0.50    Types: Cigarettes  . Smokeless tobacco: Never Used  Vaping Use  . Vaping Use: Never used  Substance Use  Topics  . Alcohol use: Yes    Comment: BAC not available  . Drug use: Yes    Types: Marijuana    Comment: Last used: 2 months ago UDS NA    Home Medications Prior to Admission medications   Medication Sig Start Date End Date Taking? Authorizing Provider  RISPERDAL 1 MG tablet Take 1 tablet (1 mg total) by mouth at bedtime. 03/13/21 04/12/21  Muthersbaugh, Dahlia Client, PA-C  amantadine (SYMMETREL) 100 MG capsule Take 1 capsule (100 mg total) by mouth 2 (two) times daily. Patient not taking: Reported on 11/04/2019 09/01/19 11/17/19  [name], NP    Allergies    Haldol [haloperidol]  Review of Systems   Review of Systems  Constitutional: Negative for chills and fever.  HENT: Negative for congestion.   Respiratory: Negative for shortness of breath.   Cardiovascular: Negative for chest pain.  Gastrointestinal: Negative for abdominal pain.  Musculoskeletal: Negative for neck pain.    Physical Exam Updated Vital Signs BP 140/79 (BP Location: Right Arm)   Pulse (!) 110   Temp 98.2 F (36.8 C) (Oral)   Resp 18   SpO2 100%   Physical Exam Vitals and nursing note reviewed.  Constitutional:      General: He is not in acute distress.    Appearance: Normal appearance. He is not ill-appearing.  HENT:     Head: Normocephalic and atraumatic.  Eyes:     General: No scleral icterus.  Right eye: No discharge.        Left eye: No discharge.     Conjunctiva/sclera: Conjunctivae normal.  Pulmonary:     Effort: Pulmonary effort is normal.     Breath sounds: No stridor.  Neurological:     Mental Status: He is alert and oriented to person, place, and time. Mental status is at baseline.     ED Results / Procedures / Treatments   Labs (all labs ordered are listed, but only abnormal results are displayed) Labs Reviewed - No data to display  EKG None  Radiology No results found.  Procedures Procedures   Medications Ordered in ED Medications  risperiDONE (RISPERDAL)  tablet 1 mg (has no administration in time range)    ED Course  I have reviewed the triage vital signs and the nursing notes.  Pertinent labs & imaging results that were available during my care of the patient were reviewed by me and considered in my medical decision making (see chart for details).    MDM Rules/Calculators/A&P                          Here for medication  He is without symptoms.   Given medication and DC-ed home.   Final Clinical Impression(s) / ED Diagnoses Final diagnoses:  Medication refill    Rx / DC Orders ED Discharge Orders    None       Gailen Shelter, [place] 03/15/21 0144    Sabas Sous, MD 03/15/21 [phone]

## 2021-03-15 NOTE — ED Triage Notes (Signed)
Patient requesting Risperidal , ran out yesterday , no SI or HI.

## 2021-03-17 ENCOUNTER — Emergency Department (HOSPITAL_COMMUNITY)
Admission: EM | Admit: 2021-03-17 | Discharge: 2021-03-17 | Disposition: A | Discharge status: Home / Self Care | Payer: Self-pay | Attending: Emergency Medicine | Admitting: Emergency Medicine

## 2021-03-17 ENCOUNTER — Encounter (HOSPITAL_COMMUNITY): Payer: Self-pay

## 2021-03-17 ENCOUNTER — Other Ambulatory Visit: Payer: Self-pay

## 2021-03-17 DIAGNOSIS — Z8616 Personal history of COVID-19: Secondary | ICD-10-CM | POA: Insufficient documentation

## 2021-03-17 DIAGNOSIS — R443 Hallucinations, unspecified: Secondary | ICD-10-CM | POA: Insufficient documentation

## 2021-03-17 DIAGNOSIS — Z760 Encounter for issue of repeat prescription: Secondary | ICD-10-CM | POA: Insufficient documentation

## 2021-03-17 DIAGNOSIS — F17210 Nicotine dependence, cigarettes, uncomplicated: Secondary | ICD-10-CM | POA: Insufficient documentation

## 2021-03-17 MED ORDER — RISPERIDONE 1 MG PO TABS
1.0000 mg | ORAL_TABLET | Freq: Once | ORAL | Status: AC
  Administered 2021-03-17: 1 mg via ORAL
  Filled 2021-03-17: qty 1

## 2021-03-17 NOTE — Discharge Instructions (Addendum)
Follow up with your doctor for further treatment of auditory hallucinations, "hearing voices".

## 2021-03-17 NOTE — ED Provider Notes (Signed)
[facility]-EMERGENCY DEPT Provider Note   CSN: [id number] Arrival date & time: 03/17/21  0144     History Chief Complaint  Patient presents with  . Medication Refill    Cole Barrett is a 35 y.o. male.  Patient to ED requesting a dose of his medications for schizophrenia. He reports he is hearing voices and he "wants to get on top of it before it gets out of control." He reports he has medication but does not have access to it until tomorrow afternoon. No other complaint.   The history is provided by the patient. No language interpreter was used.  Medication Refill      Past Medical History:  Diagnosis Date  . COVID-19   . Homelessness   . Hypercholesterolemia   . Mental disorder   . Paranoid behavior (HCC)   . PE (pulmonary embolism)   . Schizophrenia [facility])     Patient Active Problem List   Diagnosis Date Noted  . Acute psychosis (HCC) 08/31/2019  . Schizophrenia (HCC) 08/31/2019  . Schizoaffective disorder, bipolar type (HCC) 01/19/2019  . Alcohol abuse   . Auditory hallucinations   . Alcohol abuse with alcohol-induced mood disorder (HCC) 09/08/2016  . Homelessness 09/03/2016  . Person feigning illness 09/03/2016  . 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 Cannabis use disorder, mild, abuse 08/29/2016  . Pulmonary emboli (HCC) 07/13/2016  . Adjustment disorder with emotional disturbance 03/12/2014  . Pulmonary embolism and infarction (HCC) 01/02/2014  . Acute left flank pain 01/02/2014  . Pulmonary embolism, bilateral (HCC) 01/02/2014    History reviewed. No pertinent surgical history.     Family History  Problem Relation Age of Onset  . Hypertension Mother     Social History   Tobacco Use  . Smoking status: Current Every Day Smoker    Packs/day: 0.50    Types: Cigarettes  . Smokeless tobacco: Never Used  Vaping Use  . Vaping Use: Never used  Substance Use Topics  . Alcohol use: Yes    Comment: BAC not  available  . Drug use: Yes    Types: Marijuana    Comment: Last used: 2 months ago UDS NA    Home Medications Prior to Admission medications   Medication Sig Start Date End Date Taking? Authorizing Provider  ibuprofen (ADVIL) 600 MG tablet Take 1 tablet (600 mg total) by mouth every 6 (six) hours as needed. 03/15/21   Linwood Dibbles, MD  RISPERDAL 1 MG tablet Take 1 tablet (1 mg total) by mouth at bedtime. 03/13/21 04/12/21  Muthersbaugh, Dahlia Client, PA-C  amantadine (SYMMETREL) 100 MG capsule Take 1 capsule (100 mg total) by mouth 2 (two) times daily. Patient not taking: Reported on 11/04/2019 09/01/19 11/17/19  [name], NP    Allergies    Haldol [haloperidol]  Review of Systems   Review of Systems  Constitutional: Negative for chills and fever.  HENT: Negative.   Respiratory: Negative.   Cardiovascular: Negative.   Gastrointestinal: Negative.   Musculoskeletal: Negative.   Skin: Negative.   Neurological: Negative.   Psychiatric/Behavioral: Positive for hallucinations.    Physical Exam Updated Vital Signs BP 140/89 (BP Location: Left Arm)   Pulse 79   Temp 98.9 F (37.2 C) (Oral)   Resp 16   Ht 5\' 10"  (1.778 m)   Wt 108.9 kg   SpO2 97%   BMI 34.44 kg/m   Physical Exam Vitals and nursing note reviewed.  Constitutional:      Appearance: He  is well-developed.     Comments: Patient is extremely somnolent, hard to stay awake.   Pulmonary:     Effort: Pulmonary effort is normal.  Musculoskeletal:        General: Normal range of motion.     Cervical back: Normal range of motion.  Skin:    General: Skin is warm and dry.     ED Results / Procedures / Treatments   Labs (all labs ordered are listed, but only abnormal results are displayed) Labs Reviewed - No data to display  EKG None  Radiology DG Forearm Left  Result Date: 03/15/2021 CLINICAL DATA:  Blunt trauma to the forearm, initial encounter EXAM: LEFT FOREARM - 2 VIEW COMPARISON:  None. FINDINGS: There is  no evidence of fracture or other focal bone lesions. Soft tissues are unremarkable. IMPRESSION: No acute abnormality noted. Electronically Signed   By: Alcide Clever M.D.   On: 03/15/2021 23:06    Procedures Procedures   Medications Ordered in ED Medications - No data to display  ED Course  I have reviewed the triage vital signs and the nursing notes.  Pertinent labs & imaging results that were available during my care of the patient were reviewed by me and considered in my medical decision making (see chart for details).    MDM Rules/Calculators/A&P                          Patient well known to the ED with 94 visits in the last 6 months, frequently requesting a dose of his medication.   He is well known to me and his somnolence is not a normal presentation for him. When awake, he denies having taken anything, denies pain, vomiting, illness, SOB or fever. VSS.   Will let him sleep and reassess in the morning.   7:15 - on re-exam, the patient is awake, alert, oriented. He reports he is hearing voices that are getting louder and Risperdal helps with these symptoms.   Single dose provided in the ED. He is comfortable with discharge home.   Final Clinical Impression(s) / ED Diagnoses Final diagnoses:  None   1. Hallucinations   Rx / DC Orders ED Discharge Orders    None       Danne Harbor 03/17/21 6226    [name], MD 03/17/21 [phone]

## 2021-03-17 NOTE — ED Triage Notes (Signed)
Pt very sleepy. Arrives with c/o medication refill but only asks for a juice and [place] sandwich.

## 2021-03-17 NOTE — ED Notes (Signed)
Given discharge papers, instructions explained. Patient alert, oriented and ambulatory.

## 2021-03-18 ENCOUNTER — Emergency Department (HOSPITAL_COMMUNITY)
Admission: EM | Admit: 2021-03-18 | Discharge: 2021-03-18 | Disposition: A | Discharge status: Home / Self Care | Payer: Self-pay | Attending: Emergency Medicine | Admitting: Emergency Medicine

## 2021-03-18 ENCOUNTER — Other Ambulatory Visit: Payer: Self-pay

## 2021-03-18 DIAGNOSIS — Z8616 Personal history of COVID-19: Secondary | ICD-10-CM | POA: Insufficient documentation

## 2021-03-18 DIAGNOSIS — F17210 Nicotine dependence, cigarettes, uncomplicated: Secondary | ICD-10-CM | POA: Insufficient documentation

## 2021-03-18 DIAGNOSIS — Z760 Encounter for issue of repeat prescription: Secondary | ICD-10-CM | POA: Insufficient documentation

## 2021-03-18 MED ORDER — RISPERIDONE 1 MG PO TABS
1.0000 mg | ORAL_TABLET | Freq: Once | ORAL | Status: AC
  Administered 2021-03-18: 1 mg via ORAL
  Filled 2021-03-18: qty 1

## 2021-03-18 NOTE — ED Provider Notes (Signed)
[facility]-EMERGENCY DEPT Provider Note   CSN: [id number] Arrival date & time: 03/18/21  0043     History Chief Complaint  Patient presents with  . Medication Refill    Cole Barrett is a 35 y.o. male.  The history is provided by the patient and medical records.   35 y.o. M with hx of homelessness, PE, schizophrenia, presenting to the ED for refill of his risperdal.  He is well known to this department, 95 visits in the past 6 months for similar related complaints.  Past Medical History:  Diagnosis Date  . COVID-19   . Homelessness   . Hypercholesterolemia   . Mental disorder   . Paranoid behavior (HCC)   . PE (pulmonary embolism)   . Schizophrenia [facility])     Patient Active Problem List   Diagnosis Date Noted  . Acute psychosis (HCC) 08/31/2019  . Schizophrenia (HCC) 08/31/2019  . Schizoaffective disorder, bipolar type (HCC) 01/19/2019  . Alcohol abuse   . Auditory hallucinations   . Alcohol abuse with alcohol-induced mood disorder (HCC) 09/08/2016  . Homelessness 09/03/2016  . Person feigning illness 09/03/2016  . 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 Cannabis use disorder, mild, abuse 08/29/2016  . Pulmonary emboli (HCC) 07/13/2016  . Adjustment disorder with emotional disturbance 03/12/2014  . Pulmonary embolism and infarction (HCC) 01/02/2014  . Acute left flank pain 01/02/2014  . Pulmonary embolism, bilateral (HCC) 01/02/2014    No past surgical history on file.     Family History  Problem Relation Age of Onset  . Hypertension Mother     Social History   Tobacco Use  . Smoking status: Current Every Day Smoker    Packs/day: 0.50    Types: Cigarettes  . Smokeless tobacco: Never Used  Vaping Use  . Vaping Use: Never used  Substance Use Topics  . Alcohol use: Yes    Comment: BAC not available  . Drug use: Yes    Types: Marijuana    Comment: Last used: 2 months ago UDS NA    Home Medications Prior to  Admission medications   Medication Sig Start Date End Date Taking? Authorizing Provider  ibuprofen (ADVIL) 600 MG tablet Take 1 tablet (600 mg total) by mouth every 6 (six) hours as needed. 03/15/21   Linwood Dibbles, MD  RISPERDAL 1 MG tablet Take 1 tablet (1 mg total) by mouth at bedtime. 03/13/21 04/12/21  Muthersbaugh, Dahlia Client, PA-C  amantadine (SYMMETREL) 100 MG capsule Take 1 capsule (100 mg total) by mouth 2 (two) times daily. Patient not taking: Reported on 11/04/2019 09/01/19 11/17/19  [name], NP    Allergies    Haldol [haloperidol]  Review of Systems   Review of Systems  Constitutional:       Medications  All other systems reviewed and are negative.   Physical Exam Updated Vital Signs BP (!) 151/84 (BP Location: Right Arm)   Pulse 88   Temp 97.9 F (36.6 C) (Oral)   Resp 16   Ht 5\' 10"  (1.778 m)   Wt 108.9 kg   SpO2 98%   BMI 34.44 kg/m   Physical Exam Vitals and nursing note reviewed.  Constitutional:      Appearance: He is well-developed.     Comments: Disheveled appearing, sleeping  HENT:     Head: Normocephalic and atraumatic.  Eyes:     Conjunctiva/sclera: Conjunctivae normal.     Pupils: Pupils are equal, round, and reactive to light.  Cardiovascular:  Rate and Rhythm: Normal rate and regular rhythm.     Heart sounds: Normal heart sounds.  Pulmonary:     Effort: Pulmonary effort is normal. No respiratory distress.     Breath sounds: Normal breath sounds. No rhonchi.  Abdominal:     General: Bowel sounds are normal.     Palpations: Abdomen is soft.  Musculoskeletal:        General: Normal range of motion.     Cervical back: Normal range of motion.  Skin:    General: Skin is warm and dry.  Neurological:     Mental Status: He is alert and oriented to person, place, and time.     ED Results / Procedures / Treatments   Labs (all labs ordered are listed, but only abnormal results are displayed) Labs Reviewed - No data to  display  EKG None  Radiology No results found.  Procedures Procedures   Medications Ordered in ED Medications  risperiDONE (RISPERDAL) tablet 1 mg (has no administration in time range)    ED Course  I have reviewed the triage vital signs and the nursing notes.  Pertinent labs & imaging results that were available during my care of the patient were reviewed by me and considered in my medical decision making (see chart for details).    MDM Rules/Calculators/A&P  35 y.o. M here for his risperdal.  He is well known to this facility for similar complaints-- 95 visits in 6 months.  Clinically he appears at his baseline.  Given dose of risperdal here.  Encouraged to follow-up as an OP.  Return here for new concerns.  Final Clinical Impression(s) / ED Diagnoses Final diagnoses:  Medication refill    Rx / DC Orders ED Discharge Orders    None       Garlon Hatchet, PA-C 03/18/21 0222    Mesner, Barbara Cower, MD 03/18/21 [phone]

## 2021-04-21 ENCOUNTER — Encounter (HOSPITAL_COMMUNITY): Payer: Self-pay

## 2021-04-21 ENCOUNTER — Emergency Department (HOSPITAL_COMMUNITY)
Admission: EM | Admit: 2021-04-21 | Discharge: 2021-04-21 | Disposition: A | Discharge status: Home / Self Care | Payer: Self-pay | Attending: Emergency Medicine | Admitting: Emergency Medicine

## 2021-04-21 DIAGNOSIS — Z8616 Personal history of COVID-19: Secondary | ICD-10-CM | POA: Insufficient documentation

## 2021-04-21 DIAGNOSIS — F17210 Nicotine dependence, cigarettes, uncomplicated: Secondary | ICD-10-CM | POA: Insufficient documentation

## 2021-04-21 DIAGNOSIS — Z5900 Homelessness unspecified: Secondary | ICD-10-CM | POA: Insufficient documentation

## 2021-04-21 MED ORDER — RISPERIDONE 1 MG PO TABS
1.0000 mg | ORAL_TABLET | Freq: Once | ORAL | Status: AC
  Administered 2021-04-21: 1 mg via ORAL
  Filled 2021-04-21: qty 1

## 2021-04-21 NOTE — ED Provider Notes (Signed)
[facility]-EMERGENCY DEPT Provider Note   CSN: [id number] Arrival date & time: 04/21/21  0234     History Chief Complaint  Patient presents with  . Homeless    Cole Barrett is a 35 y.o. male.  The history is provided by the patient and medical records.    35 y.o. M with history of homelessness, schizophrenia, PE, presnting to the ED for his dose of risperdal. Patient was sitting the lobby after visit earlier today and was asked to leave so he decided to check back in.  He is well known to this facility, 88 visits in the past 6 months.  He is now requesting his nightly risperdal.  Past Medical History:  Diagnosis Date  . COVID-19   . Homelessness   . Hypercholesterolemia   . Mental disorder   . Paranoid behavior (HCC)   . PE (pulmonary embolism)   . Schizophrenia [facility])     Patient Active Problem List   Diagnosis Date Noted  . Acute psychosis (HCC) 08/31/2019  . Schizophrenia (HCC) 08/31/2019  . Schizoaffective disorder, bipolar type (HCC) 01/19/2019  . Alcohol abuse   . Auditory hallucinations   . Alcohol abuse with alcohol-induced mood disorder (HCC) 09/08/2016  . Homelessness 09/03/2016  . Person feigning illness 09/03/2016  . 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 Cannabis use disorder, mild, abuse 08/29/2016  . Pulmonary emboli (HCC) 07/13/2016  . Adjustment disorder with emotional disturbance 03/12/2014  . Pulmonary embolism and infarction (HCC) 01/02/2014  . Acute left flank pain 01/02/2014  . Pulmonary embolism, bilateral (HCC) 01/02/2014    History reviewed. No pertinent surgical history.     Family History  Problem Relation Age of Onset  . Hypertension Mother     Social History   Tobacco Use  . Smoking status: Current Every Day Smoker    Packs/day: 0.50    Types: Cigarettes  . Smokeless tobacco: Never Used  Vaping Use  . Vaping Use: Never used  Substance Use Topics  . Alcohol use: Yes    Comment: BAC  not available  . Drug use: Yes    Types: Marijuana    Comment: Last used: 2 months ago UDS NA    Home Medications Prior to Admission medications   Medication Sig Start Date End Date Taking? Authorizing Provider  ibuprofen (ADVIL) 600 MG tablet Take 1 tablet (600 mg total) by mouth every 6 (six) hours as needed. 03/15/21   Linwood Dibbles, MD  RISPERDAL 1 MG tablet Take 1 tablet (1 mg total) by mouth at bedtime. 03/13/21 04/12/21  Muthersbaugh, Dahlia Client, PA-C  amantadine (SYMMETREL) 100 MG capsule Take 1 capsule (100 mg total) by mouth 2 (two) times daily. Patient not taking: Reported on 11/04/2019 09/01/19 11/17/19  [name], NP    Allergies    Haldol [haloperidol]  Review of Systems   Review of Systems  Constitutional:       Medications  All other systems reviewed and are negative.   Physical Exam Updated Vital Signs BP (!) 151/90 (BP Location: Right Arm)   Pulse 83   Temp 97.9 F (36.6 C) (Oral)   Ht 5\' 10"  (1.778 m)   Wt 108.9 kg   SpO2 98%   BMI 34.45 kg/m   Physical Exam Vitals and nursing note reviewed.  Constitutional:      Appearance: He is well-developed.     Comments: Sleeping, NAD, appears at baseline  HENT:     Head: Normocephalic and atraumatic.  Eyes:  Conjunctiva/sclera: Conjunctivae normal.     Pupils: Pupils are equal, round, and reactive to light.  Cardiovascular:     Rate and Rhythm: Normal rate and regular rhythm.     Heart sounds: Normal heart sounds.  Pulmonary:     Effort: Pulmonary effort is normal.     Breath sounds: Normal breath sounds.  Abdominal:     General: Bowel sounds are normal.     Palpations: Abdomen is soft.  Musculoskeletal:        General: Normal range of motion.     Cervical back: Normal range of motion.  Skin:    General: Skin is warm and dry.  Neurological:     Mental Status: He is alert and oriented to person, place, and time.     ED Results / Procedures / Treatments   Labs (all labs ordered are listed, but  only abnormal results are displayed) Labs Reviewed - No data to display  EKG None  Radiology No results found.  Procedures Procedures   Medications Ordered in ED Medications  risperiDONE (RISPERDAL) tablet 1 mg (has no administration in time range)    ED Course  I have reviewed the triage vital signs and the nursing notes.  Pertinent labs & imaging results that were available during my care of the patient were reviewed by me and considered in my medical decision making (see chart for details).    MDM Rules/Calculators/A&P  35 year old male presenting to the ED after being asked to leave the lobby.  He is apparently been sitting there for several hours after being discharged earlier today.  He is currently homeless and well-known to this facility.  He is now requesting his nighttime Risperdal.  He is hemodynamically stable and appears at his baseline.  He is not acutely psychotic.  I suspect this visit is largely due to his homelessness and fact that it is raining outside.  He was given his dose of Risperdal and will be discharged to follow-up with PCP.  Final Clinical Impression(s) / ED Diagnoses Final diagnoses:  Homelessness    Rx / DC Orders ED Discharge Orders    None       Garlon Hatchet, PA-C 04/21/21 0327    [name], MD 04/21/21 [phone]

## 2021-04-21 NOTE — ED Triage Notes (Signed)
Pt checked in after being asked to leave the lobby due to malingering. When asked why he is checking is he states for medication. States he has been taking his risperdal regularly

## 2021-04-21 NOTE — ED Notes (Signed)
Pt ambulatory in triage. 

## 2021-05-13 ENCOUNTER — Other Ambulatory Visit: Payer: Self-pay

## 2021-05-13 ENCOUNTER — Other Ambulatory Visit (HOSPITAL_COMMUNITY): Payer: Self-pay

## 2021-05-13 ENCOUNTER — Emergency Department (HOSPITAL_COMMUNITY)
Admission: EM | Admit: 2021-05-13 | Discharge: 2021-05-13 | Disposition: A | Discharge status: Home / Self Care | Payer: Self-pay | Attending: Emergency Medicine | Admitting: Emergency Medicine

## 2021-05-13 ENCOUNTER — Encounter (HOSPITAL_COMMUNITY): Payer: Self-pay | Admitting: Emergency Medicine

## 2021-05-13 DIAGNOSIS — Z8616 Personal history of COVID-19: Secondary | ICD-10-CM | POA: Insufficient documentation

## 2021-05-13 DIAGNOSIS — F17210 Nicotine dependence, cigarettes, uncomplicated: Secondary | ICD-10-CM | POA: Insufficient documentation

## 2021-05-13 DIAGNOSIS — Z760 Encounter for issue of repeat prescription: Secondary | ICD-10-CM | POA: Insufficient documentation

## 2021-05-13 MED ORDER — RISPERIDONE 1 MG PO TABS
1.0000 mg | ORAL_TABLET | Freq: Once | ORAL | Status: AC
  Administered 2021-05-13: 1 mg via ORAL
  Filled 2021-05-13: qty 1

## 2021-05-13 MED ORDER — RISPERDAL 1 MG PO TABS: 1.0000 mg | ORAL_TABLET | Freq: Every day | ORAL | 0 refills | Status: DC

## 2021-05-13 MED ORDER — RISPERIDONE 1 MG PO TABS
1.0000 mg | ORAL_TABLET | Freq: Every day | ORAL | 0 refills | Status: DC
  Filled 2021-05-13: qty 30, 30d supply, fill #0

## 2021-05-13 NOTE — ED Provider Notes (Signed)
[facility] EMERGENCY DEPARTMENT Provider Note   CSN: [id number] Arrival date & time: 05/13/21  5784     History Chief Complaint  Patient presents with   Medication Refill    Cole Barrett is a 35 y.o. male.  HPI  Patient presents with request for refill of his risperidone medicine.  States he just misplaced his pills.  He has a history of frequent visits for this reason.  He denies any suicidal ideations, homicidal ideations, hallucinations at the moment.  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 No pertinent surgical history.     Family History  Problem Relation Age of Onset   Hypertension Mother     Social History   Tobacco Use   Smoking status: Every Day    Packs/day: 0.50    Pack years: 0.00    Types: Cigarettes   Smokeless tobacco: Never  Vaping Use   Vaping Use: Never used  Substance Use Topics   Alcohol use: Yes    Comment: BAC not available   Drug use: Yes    Types: Marijuana    Comment: Last used: 2 months ago UDS NA    Home Medications Prior to Admission medications    Medication Sig Start Date End Date Taking? Authorizing Provider  ibuprofen (ADVIL) 600 MG tablet Take 1 tablet (600 mg total) by mouth every 6 (six) hours as needed. 03/15/21   Linwood Dibbles, MD  RISPERDAL 1 MG tablet Take 1 tablet (1 mg total) by mouth at bedtime. 03/13/21 04/12/21  Muthersbaugh, Dahlia Client, PA-C  amantadine (SYMMETREL) 100 MG capsule Take 1 capsule (100 mg total) by mouth 2 (two) times daily. Patient not taking: Reported on 11/04/2019 09/01/19 11/17/19  [name], NP    Allergies    Haldol [haloperidol]  Review of Systems   Review of Systems  Neurological:  Negative for dizziness and light-headedness.  Psychiatric/Behavioral:  Negative for hallucinations and suicidal ideas.    Physical Exam Updated Vital Signs BP 121/69   Pulse 71   Temp 98.4 F (36.9 C) (Oral)   Resp 15   SpO2 100%   Physical Exam Vitals and nursing note reviewed. Exam conducted with a chaperone present.  Constitutional:      General: He is not in acute distress.    Appearance: Normal appearance.     Comments: Disheveled appearance.  HENT:     Head: Normocephalic and atraumatic.  Eyes:     General: No scleral icterus.    Extraocular Movements: Extraocular movements intact.     Pupils: Pupils are equal, round, and reactive to light.  Skin:  Coloration: Skin is not jaundiced.  Neurological:     Mental Status: He is alert. Mental status is at baseline.     Coordination: Coordination normal.  Psychiatric:     Comments: Appears to be at baseline.  Not responding to any internal stimuli.    ED Results / Procedures / Treatments   Labs (all labs ordered are listed, but only abnormal results are displayed) Labs Reviewed - No data to display  EKG None  Radiology No results found.  Procedures Procedures   Medications Ordered in ED Medications - No data to display  ED Course  I have reviewed the triage vital signs and the nursing notes.  Pertinent labs & imaging results that were  available during my care of the patient were reviewed by me and considered in my medical decision making (see chart for details).    MDM Rules/Calculators/A&P                          Patient is a 35 year old male with history of bipolar disorder schizophrenia, homelessness.  He is visiting the ER today for a medication refill request.  He has many visits of the same.  Ordered him 1 g of risperidone.  Not having any suicidal ideations or homicidal ideations.  Not currently responding to hallucinations.  Medicine given.  Patient is appropriate for discharge.  I prescribed him 1 month supply to the transition pharmacy here at [facility].  Advised patient to seek outpatient management.  Advised follow-up with his PCP  Final Clinical Impression(s) / ED Diagnoses Final diagnoses:  None    Rx / DC Orders ED Discharge Orders     None        Theron Arista, Cordelia Poche 05/13/21 1429    Gerhard Munch, MD 05/13/21 [phone]

## 2021-05-13 NOTE — ED Notes (Signed)
Pt left before D/C v/s were obtained.

## 2021-05-13 NOTE — Discharge Instructions (Addendum)
You are seen today in the emergency department for refill of your risperidone medicine.  I have sent a month of the pills to the pharmacy here at [facility] pharmacy.  Please continue to take them once daily before bed.  Return to the emergency department as needed.

## 2021-05-13 NOTE — Discharge Planning (Signed)
RNCM consulted regarding homeless, uninsured pt needing medication assistance.  RNCM advised to have pt go to [facility] for a one time free Rx fill and to register with [facility] medical team for consistent medical care and Rx needs. EDCM contacted Mary Ann Placey, NP with [facility] to alert her of pt arrival.  

## 2021-05-13 NOTE — ED Triage Notes (Signed)
Pt states he is here for Risperdal refill. He ran out yesterday. Denies SI/Hi.

## 2021-05-13 NOTE — Discharge Planning (Signed)
RNCM consulted regarding medication assistance of pt unable to afford medications.  RNCM utilized [facility] petty cash to cover $4.00 co-pay and filled through [facility].  Rx e-scribed to [facility] and delivered to pt prior to discharge home.  No further RNCM needs identified at this time.    

## 2021-05-15 ENCOUNTER — Other Ambulatory Visit: Payer: Self-pay

## 2021-05-15 ENCOUNTER — Encounter (HOSPITAL_COMMUNITY): Payer: Self-pay

## 2021-05-15 ENCOUNTER — Emergency Department (HOSPITAL_COMMUNITY)
Admission: EM | Admit: 2021-05-15 | Discharge: 2021-05-16 | Disposition: A | Discharge status: Home / Self Care | Payer: Self-pay | Attending: Emergency Medicine | Admitting: Emergency Medicine

## 2021-05-15 DIAGNOSIS — F17210 Nicotine dependence, cigarettes, uncomplicated: Secondary | ICD-10-CM | POA: Insufficient documentation

## 2021-05-15 DIAGNOSIS — Z8616 Personal history of COVID-19: Secondary | ICD-10-CM | POA: Insufficient documentation

## 2021-05-15 DIAGNOSIS — L2082 Flexural eczema: Secondary | ICD-10-CM

## 2021-05-15 DIAGNOSIS — R21 Rash and other nonspecific skin eruption: Secondary | ICD-10-CM | POA: Insufficient documentation

## 2021-05-15 NOTE — ED Triage Notes (Signed)
Pt to Ed from home with c/o rash on L arm which began today. Arrives A+O, VSS, NADN.

## 2021-05-15 NOTE — ED Provider Notes (Signed)
Emergency Medicine Provider Triage Evaluation Note  Cole Barrett , a 35 y.o. male  was evaluated in triage.  Pt complains of rash to the left arm.  States it started recently.  States he needs a cream.  Denies any fevers.  Seen frequently in the ED.  Review of Systems  Positive: Rash, itching Negative: fever  Physical Exam  BP (!) 143/74 (BP Location: Left Arm)   Pulse 70   Temp 98.4 F (36.9 C)   Resp 18   Ht 5\' 10"  (1.778 m)   Wt 109 kg   SpO2 100%   BMI 34.48 kg/m  Gen:   Awake, no distress   Resp:  Normal effort  MSK:   Moves extremities without difficulty  Other:  Flexural rash on left elbow, no abscess or cellulitis  Medical Decision Making  Medically screening exam initiated at 11:42 PM.  Appropriate orders placed.  Cole Barrett was informed that the remainder of the evaluation will be completed by another provider, this initial triage assessment does not replace that evaluation, and the importance of remaining in the ED until their evaluation is complete.  Rash     Cole Hawking, PA-C 05/15/21 2348    Palumbo, April, MD 05/15/21 2353

## 2021-05-16 MED ORDER — TRIAMCINOLONE ACETONIDE 0.1 % EX CREA
1.0000 | TOPICAL_CREAM | Freq: Two times a day (BID) | CUTANEOUS | 0 refills | Status: DC
Start: 2021-05-16 — End: 2021-12-23

## 2021-05-16 NOTE — ED Provider Notes (Signed)
[facility]-EMERGENCY DEPT Provider Note   CSN: [id number] Arrival date & time: 05/15/21  2306     History Chief Complaint  Patient presents with  . Rash    Cole Barrett is a 35 y.o. male.  The history is provided by the patient.  Rash Location: AC B. Severity:  Mild Onset quality:  Gradual Duration: days. Chronicity:  Recurrent Context: not animal contact, not medications and not pregnancy   Relieved by:  Nothing Worsened by:  Nothing Ineffective treatments:  None tried Associated symptoms: no abdominal pain, no fatigue, no fever, no induration, no joint pain, no myalgias, no periorbital edema, no shortness of breath and not wheezing       Past Medical History:  Diagnosis Date  . COVID-19   . Homelessness   . Hypercholesterolemia   . Mental disorder   . Paranoid behavior (HCC)   . PE (pulmonary embolism)   . Schizophrenia [facility])     Patient Active Problem List   Diagnosis Date Noted  . Acute psychosis (HCC) 08/31/2019  . Schizophrenia (HCC) 08/31/2019  . Schizoaffective disorder, bipolar type (HCC) 01/19/2019  . Alcohol abuse   . Auditory hallucinations   . Alcohol abuse with alcohol-induced mood disorder (HCC) 09/08/2016  . Homelessness 09/03/2016  . Person feigning illness 09/03/2016  . 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 Cannabis use disorder, mild, abuse 08/29/2016  . Pulmonary emboli (HCC) 07/13/2016  . Adjustment disorder with emotional disturbance 03/12/2014  . Pulmonary embolism and infarction (HCC) 01/02/2014  . Acute left flank pain 01/02/2014  . Pulmonary embolism, bilateral (HCC) 01/02/2014    History reviewed. No pertinent surgical history.     Family History  Problem Relation Age of Onset  . Hypertension Mother     Social History   Tobacco Use  . Smoking status: Every Day    Packs/day: 0.50    Pack years: 0.00    Types: Cigarettes  . Smokeless tobacco: Never  Vaping Use  . Vaping Use:  Never used  Substance Use Topics  . Alcohol use: Yes    Comment: BAC not available  . Drug use: Yes    Types: Marijuana    Comment: Last used: 2 months ago UDS NA    Home Medications Prior to Admission medications   Medication Sig Start Date End Date Taking? Authorizing Provider  ibuprofen (ADVIL) 600 MG tablet Take 1 tablet (600 mg total) by mouth every 6 (six) hours as needed. 03/15/21   Linwood Dibbles, MD  risperiDONE (RISPERDAL) 1 MG tablet Take 1 tablet (1 mg total) by mouth at bedtime. 05/13/21 06/12/21  Theron Arista, PA-C  amantadine (SYMMETREL) 100 MG capsule Take 1 capsule (100 mg total) by mouth 2 (two) times daily. Patient not taking: Reported on 11/04/2019 09/01/19 11/17/19  [name], NP    Allergies    Haldol [haloperidol]  Review of Systems   Review of Systems  Constitutional:  Negative for fatigue and fever.  HENT:  Negative for drooling.   Eyes:  Negative for redness.  Respiratory:  Negative for shortness of breath and wheezing.   Cardiovascular:  Negative for leg swelling.  Gastrointestinal:  Negative for abdominal pain.  Genitourinary:  Negative for difficulty urinating.  Musculoskeletal:  Negative for arthralgias and myalgias.  Skin:  Positive for rash.  Neurological:  Negative for facial asymmetry.  Psychiatric/Behavioral:  Negative for agitation.   All other systems reviewed and are negative.  Physical Exam Updated Vital Signs BP (!) 143/74 (  BP Location: Left Arm)   Pulse 70   Temp 98.4 F (36.9 C)   Resp 18   Ht 5\' 10"  (1.778 m)   Wt 109 kg   SpO2 100%   BMI 34.48 kg/m   Physical Exam Vitals and nursing note reviewed.  Constitutional:      General: He is not in acute distress.    Appearance: Normal appearance.  HENT:     Head: Normocephalic and atraumatic.     Nose: Nose normal.  Eyes:     Conjunctiva/sclera: Conjunctivae normal.     Pupils: Pupils are equal, round, and reactive to light.  Cardiovascular:     Rate and Rhythm: Normal rate  and regular rhythm.     Pulses: Normal pulses.     Heart sounds: Normal heart sounds.  Pulmonary:     Effort: Pulmonary effort is normal.     Breath sounds: Normal breath sounds.  Abdominal:     General: Abdomen is flat. Bowel sounds are normal.     Palpations: Abdomen is soft.     Tenderness: There is no abdominal tenderness. There is no guarding.  Musculoskeletal:        General: Normal range of motion.     Cervical back: Normal range of motion and neck supple.  Skin:    General: Skin is warm and dry.     Capillary Refill: Capillary refill takes less than 2 seconds.  Neurological:     General: No focal deficit present.     Mental Status: He is alert and oriented to person, place, and time.     Deep Tendon Reflexes: Reflexes normal.  Psychiatric:        Mood and Affect: Mood normal.        Behavior: Behavior normal.    ED Results / Procedures / Treatments   Labs (all labs ordered are listed, but only abnormal results are displayed) Labs Reviewed - No data to display  EKG None  Radiology No results found.  Procedures Procedures   Medications Ordered in ED Medications - No data to display  ED Course  I have reviewed the triage vital signs and the nursing notes.  Pertinent labs & imaging results that were available during my care of the patient were reviewed by me and considered in my medical decision making (see chart for details).   Likely eczema, will start steroid cream.   Cole Barrett was evaluated in Emergency Department on 05/16/2021 for the symptoms described in the history of present illness. He was evaluated in the context of the global COVID-19 pandemic, which necessitated consideration that the patient might be at risk for infection with the SARS-CoV-2 virus that causes COVID-19. Institutional protocols and algorithms that pertain to the evaluation of patients at risk for COVID-19 are in a state of rapid change based on information released by  regulatory bodies including the CDC and federal and state organizations. These policies and algorithms were followed during the patient's care in the ED.  Final Clinical Impression(s) / ED Diagnoses Final diagnoses:  None     Return for intractable cough, coughing up blood, fevers > 100.4 unrelieved by medication, shortness of breath, intractable vomiting, chest pain, shortness of breath, weakness, numbness, changes in speech, facial asymmetry, abdominal pain, passing out, Inability to tolerate liquids or food, cough, altered mental status or any concerns. No signs of systemic illness or infection. The patient is nontoxic-appearing on exam and vital signs are within normal limits. I have reviewed  the triage vital signs and the nursing notes. Pertinent labs & imaging results that were available during my care of the patient were reviewed by me and considered in my medical decision making (see chart for details). After history, exam, and medical workup I feel the patient has been appropriately medically screened and is safe for discharge home. Pertinent diagnoses were discussed with the patient. Patient was given return precautions.  Rx / DC Orders ED Discharge Orders     None        Tywanda Rice, MD 05/16/21 9242

## 2021-05-27 ENCOUNTER — Emergency Department (HOSPITAL_COMMUNITY)
Admission: EM | Admit: 2021-05-27 | Discharge: 2021-05-27 | Disposition: A | Discharge status: Home / Self Care | Payer: Self-pay | Attending: Emergency Medicine | Admitting: Emergency Medicine

## 2021-05-27 ENCOUNTER — Other Ambulatory Visit: Payer: Self-pay

## 2021-05-27 ENCOUNTER — Encounter (HOSPITAL_COMMUNITY): Payer: Self-pay

## 2021-05-27 DIAGNOSIS — Z5181 Encounter for therapeutic drug level monitoring: Secondary | ICD-10-CM | POA: Insufficient documentation

## 2021-05-27 DIAGNOSIS — F17210 Nicotine dependence, cigarettes, uncomplicated: Secondary | ICD-10-CM | POA: Insufficient documentation

## 2021-05-27 DIAGNOSIS — Z8616 Personal history of COVID-19: Secondary | ICD-10-CM | POA: Insufficient documentation

## 2021-05-27 DIAGNOSIS — R440 Auditory hallucinations: Secondary | ICD-10-CM | POA: Insufficient documentation

## 2021-05-27 DIAGNOSIS — Z7689 Persons encountering health services in other specified circumstances: Secondary | ICD-10-CM

## 2021-05-27 MED ORDER — RISPERIDONE 1 MG PO TABS
1.0000 mg | ORAL_TABLET | Freq: Once | ORAL | Status: AC
  Administered 2021-05-27: 1 mg via ORAL
  Filled 2021-05-27: qty 1

## 2021-05-27 NOTE — ED Triage Notes (Signed)
Pt states he is here for his dose of Risperidone. Pt states he doesn't have any. Pt states he last took it last night. Pt has no medical complaints at this time.

## 2021-05-27 NOTE — ED Provider Notes (Signed)
[facility]-EMERGENCY DEPT Provider Note   CSN: [id number] Arrival date & time: 05/27/21  2148     History Chief Complaint  Patient presents with   Medication Refill    Cole Barrett is a 35 y.o. male with PMHx schizophrenia, presenting for medication administration. Patient states he is unable to get back to his house tonight and is staying with a friend. His medication is at his home and he needs his dose of risperidone. He states he does not need a refill, just a dose tonight. He has auditory hallucinations. No SI/HI. No other complaints.  He has come to the ED in the past with the same request.   The history is provided by the patient.      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 No pertinent surgical history.     Family History  Problem Relation Age of Onset   Hypertension Mother     Social History   Tobacco Use   Smoking status: Every Day    Packs/day: 0.50    Pack years: 0.00    Types: Cigarettes   Smokeless tobacco: Never  Vaping Use   Vaping Use: Never used  Substance Use  Topics   Alcohol use: Yes    Comment: BAC not available   Drug use: Yes    Types: Marijuana    Comment: Last used: 2 months ago UDS NA    Home Medications Prior to Admission medications   Medication Sig Start Date End Date Taking? Authorizing Provider  ibuprofen (ADVIL) 600 MG tablet Take 1 tablet (600 mg total) by mouth every 6 (six) hours as needed. 03/15/21   Linwood Dibbles, MD  risperiDONE (RISPERDAL) 1 MG tablet Take 1 tablet (1 mg total) by mouth at bedtime. 05/13/21 06/12/21  Theron Arista, PA-C  triamcinolone cream (KENALOG) 0.1 % Apply 1 application topically 2 (two) times daily. 05/16/21   Palumbo, April, MD  amantadine (SYMMETREL) 100 MG capsule Take 1 capsule (100 mg total) by mouth 2 (two) times daily. Patient not taking: Reported on 11/04/2019 09/01/19 11/17/19  [name], NP    Allergies    Haldol [haloperidol]  Review of Systems   Review of Systems  Psychiatric/Behavioral:  Positive for hallucinations. Negative for suicidal ideas.   All other systems reviewed and are negative.  Physical Exam Updated Vital Signs BP 130/77   Pulse 74   Temp 98.5 F (36.9 C) (Oral)   Resp 18   SpO2 99%   Physical Exam Vitals and nursing note reviewed.  Constitutional:      Appearance: He is well-developed.  HENT:  Head: Normocephalic and atraumatic.  Eyes:     Conjunctiva/sclera: Conjunctivae normal.  Cardiovascular:     Rate and Rhythm: Normal rate.  Pulmonary:     Effort: Pulmonary effort is normal.  Neurological:     Mental Status: He is alert.  Psychiatric:        Mood and Affect: Mood normal.        Behavior: Behavior normal.     Comments: Calm and cooperative. appropriate    ED Results / Procedures / Treatments   Labs (all labs ordered are listed, but only abnormal results are displayed) Labs Reviewed - No data to display  EKG None  Radiology No results found.  Procedures Procedures   Medications Ordered in ED Medications  risperiDONE (RISPERDAL)  tablet 1 mg (1 mg Oral Given 05/27/21 2231)    ED Course  I have reviewed the triage vital signs and the nursing notes.  Pertinent labs & imaging results that were available during my care of the patient were reviewed by me and considered in my medical decision making (see chart for details).    MDM Rules/Calculators/A&P                          Patient here for his home dose of rispiridone. Has auditory hallucinations, but has hx of schizophrenia. Has been seen in the ED for the same multiple times. He states he does not need RX, just a dose tonight. He is calm and cooperative, appropriate. Hx of homelessness. Given home dose and discharged.    Final Clinical Impression(s) / ED Diagnoses Final diagnoses:  Encounter for medication administration    Rx / DC Orders ED Discharge Orders     None        Teylor Wolven, [place] N, PA-C 05/27/21 2250    Wynetta Fines, MD 05/27/21 2258

## 2021-06-24 ENCOUNTER — Emergency Department (HOSPITAL_COMMUNITY)
Admission: EM | Admit: 2021-06-24 | Discharge: 2021-06-24 | Disposition: A | Discharge status: Home / Self Care | Payer: Self-pay | Attending: Emergency Medicine | Admitting: Emergency Medicine

## 2021-06-24 ENCOUNTER — Other Ambulatory Visit: Payer: Self-pay

## 2021-06-24 DIAGNOSIS — Z0289 Encounter for other administrative examinations: Secondary | ICD-10-CM | POA: Insufficient documentation

## 2021-06-24 DIAGNOSIS — Z8616 Personal history of COVID-19: Secondary | ICD-10-CM | POA: Insufficient documentation

## 2021-06-24 DIAGNOSIS — Z7689 Persons encountering health services in other specified circumstances: Secondary | ICD-10-CM

## 2021-06-24 DIAGNOSIS — F17210 Nicotine dependence, cigarettes, uncomplicated: Secondary | ICD-10-CM | POA: Insufficient documentation

## 2021-06-24 MED ORDER — RISPERIDONE 1 MG PO TABS
1.0000 mg | ORAL_TABLET | Freq: Once | ORAL | Status: AC
  Administered 2021-06-24: 1 mg via ORAL
  Filled 2021-06-24: qty 1

## 2021-06-24 NOTE — ED Triage Notes (Signed)
Pt states he needs his risperdal dose

## 2021-06-24 NOTE — ED Provider Notes (Signed)
[facility] EMERGENCY DEPARTMENT Provider Note   CSN: [id number] Arrival date & time: 06/24/21  6712     History No chief complaint on file.   Cole Barrett is a 35 y.o. male.  Patient to ED requesting a dose of his medication, reporting he does not have his nighttime dose of Risperdal. He reports he is hearing voices but also that he is at his baseline without exacerbation of symptoms. No HI/SI.   The history is provided by the patient. No language interpreter was used.      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     Family History  Problem Relation Age of Onset   Hypertension Mother     Social History   Tobacco Use   Smoking status: Every Day    Packs/day: 0.50    Types: Cigarettes   Smokeless tobacco: Never  Vaping Use   Vaping Use: Never used  Substance Use Topics   Alcohol use: Yes    Comment: BAC not available   Drug use: Yes    Types: Marijuana    Comment: Last used: 2 months ago UDS NA    Home Medications Prior to Admission medications   Medication Sig  Start Date End Date Taking? Authorizing Provider  ibuprofen (ADVIL) 600 MG tablet Take 1 tablet (600 mg total) by mouth every 6 (six) hours as needed. 03/15/21   Linwood Dibbles, MD  risperiDONE (RISPERDAL) 1 MG tablet Take 1 tablet (1 mg total) by mouth at bedtime. 05/13/21 06/12/21  Theron Arista, PA-C  triamcinolone cream (KENALOG) 0.1 % Apply 1 application topically 2 (two) times daily. 05/16/21   Palumbo, April, MD  amantadine (SYMMETREL) 100 MG capsule Take 1 capsule (100 mg total) by mouth 2 (two) times daily. Patient not taking: Reported on 11/04/2019 09/01/19 11/17/19  [name], NP    Allergies    Haldol [haloperidol]  Review of Systems   Review of Systems  Constitutional:  Negative for chills and fever.  HENT: Negative.    Respiratory: Negative.    Cardiovascular: Negative.   Gastrointestinal: Negative.   Musculoskeletal: Negative.   Skin: Negative.   Neurological: Negative.   Psychiatric/Behavioral: Negative.     Physical Exam Updated Vital Signs BP 128/81 (BP Location: Right Arm)   Pulse 72   Temp 97.8 F (36.6 C) (Oral)   Resp 20   SpO2 99%   Physical Exam Vitals and nursing note reviewed.  Constitutional:      Appearance: He is well-developed.  Pulmonary:     Effort: Pulmonary effort is normal.  Musculoskeletal:  General: Normal range of motion.     Cervical back: Normal range of motion.  Skin:    General: Skin is warm and dry.  Neurological:     Mental Status: He is alert and oriented to person, place, and time.    ED Results / Procedures / Treatments   Labs (all labs ordered are listed, but only abnormal results are displayed) Labs Reviewed - No data to display  EKG None  Radiology No results found.  Procedures Procedures   Medications Ordered in ED Medications  risperiDONE (RISPERDAL) tablet 1 mg (1 mg Oral Given 06/24/21 0349)    ED Course  I have reviewed the triage vital signs and the nursing notes.  Pertinent labs & imaging  results that were available during my care of the patient were reviewed by me and considered in my medical decision making (see chart for details).    MDM Rules/Calculators/A&P                           Patient well known to the ED. Here for night dose of Risperdal, which on chart review is 1 mg. This is provided. He states he will have access to his regular medications tomorrow.   Stable for discharge.   Final Clinical Impression(s) / ED Diagnoses Final diagnoses:  Encounter for medication administration    Rx / DC Orders ED Discharge Orders     None        Danne Harbor 06/24/21 0406    [name], MD 06/24/21 [phone]

## 2021-06-24 NOTE — Discharge Instructions (Addendum)
Continue your regular dosing as prescribed.

## 2021-08-09 ENCOUNTER — Other Ambulatory Visit: Payer: Self-pay

## 2021-08-09 ENCOUNTER — Emergency Department (HOSPITAL_COMMUNITY)
Admission: EM | Admit: 2021-08-09 | Discharge: 2021-08-10 | Disposition: A | Discharge status: Home / Self Care | Payer: Self-pay | Attending: Emergency Medicine | Admitting: Emergency Medicine

## 2021-08-09 ENCOUNTER — Encounter (HOSPITAL_COMMUNITY): Payer: Self-pay | Admitting: Emergency Medicine

## 2021-08-09 DIAGNOSIS — Z5321 Procedure and treatment not carried out due to patient leaving prior to being seen by health care provider: Secondary | ICD-10-CM | POA: Insufficient documentation

## 2021-08-09 DIAGNOSIS — Z760 Encounter for issue of repeat prescription: Secondary | ICD-10-CM | POA: Insufficient documentation

## 2021-08-09 NOTE — ED Triage Notes (Signed)
Patient needs his Risperdal today.  He states that he [place] side of [place].

## 2021-08-10 NOTE — ED Notes (Signed)
PT decided to leave . 

## 2021-08-12 ENCOUNTER — Emergency Department (HOSPITAL_COMMUNITY)
Admission: EM | Admit: 2021-08-12 | Discharge: 2021-08-12 | Disposition: A | Discharge status: Home / Self Care | Payer: Self-pay | Attending: Emergency Medicine | Admitting: Emergency Medicine

## 2021-08-12 ENCOUNTER — Encounter (HOSPITAL_COMMUNITY): Payer: Self-pay | Admitting: Emergency Medicine

## 2021-08-12 ENCOUNTER — Other Ambulatory Visit: Payer: Self-pay

## 2021-08-12 DIAGNOSIS — R443 Hallucinations, unspecified: Secondary | ICD-10-CM | POA: Insufficient documentation

## 2021-08-12 DIAGNOSIS — Z5321 Procedure and treatment not carried out due to patient leaving prior to being seen by health care provider: Secondary | ICD-10-CM | POA: Insufficient documentation

## 2021-08-12 NOTE — ED Provider Notes (Signed)
Emergency Medicine Provider Triage Evaluation Note  Cole Barrett , a 35 y.o. male  was evaluated in triage.  Pt complains of hearing voices again.  This is a frequent problem for him.  States he has been taking his Risperdal.  Denies SI/HI.  Review of Systems  Positive: hallucinations Negative: SI/HI  Physical Exam  BP 125/88   Pulse 68   Temp 97.9 F (36.6 C) (Oral)   Resp 16   SpO2 97%  Gen:   Sleeping upright in chair, awoken for exam Resp:  Normal effort  MSK:   Moves extremities without difficulty  Other:    Medical Decision Making  Medically screening exam initiated at 1:19 AM.  Appropriate orders placed.  Ki Corbo was informed that the remainder of the evaluation will be completed by another provider, this initial triage assessment does not replace that evaluation, and the importance of remaining in the ED until their evaluation is complete.  Hallucinations, chronic problem for him.  Denies SI/HI during triage.   Garlon Hatchet, PA-C 08/12/21 0120    Zadie Rhine, MD 08/12/21 [phone]

## 2021-08-12 NOTE — ED Triage Notes (Signed)
Pt reports he is hearing voices but has been taking his medications.  Pt denies SI

## 2021-08-12 NOTE — ED Notes (Signed)
Pt left. Notified registration.

## 2021-08-13 ENCOUNTER — Encounter (HOSPITAL_COMMUNITY): Payer: Self-pay | Admitting: Emergency Medicine

## 2021-08-13 ENCOUNTER — Other Ambulatory Visit: Payer: Self-pay

## 2021-08-13 ENCOUNTER — Emergency Department (HOSPITAL_COMMUNITY)
Admission: EM | Admit: 2021-08-13 | Discharge: 2021-08-13 | Disposition: A | Discharge status: Home / Self Care | Payer: Self-pay | Attending: Emergency Medicine | Admitting: Emergency Medicine

## 2021-08-13 DIAGNOSIS — Z8616 Personal history of COVID-19: Secondary | ICD-10-CM | POA: Insufficient documentation

## 2021-08-13 DIAGNOSIS — R443 Hallucinations, unspecified: Secondary | ICD-10-CM | POA: Insufficient documentation

## 2021-08-13 DIAGNOSIS — Z760 Encounter for issue of repeat prescription: Secondary | ICD-10-CM | POA: Insufficient documentation

## 2021-08-13 DIAGNOSIS — F17210 Nicotine dependence, cigarettes, uncomplicated: Secondary | ICD-10-CM | POA: Insufficient documentation

## 2021-08-13 MED ORDER — RISPERIDONE 1 MG PO TABS
1.0000 mg | ORAL_TABLET | Freq: Once | ORAL | Status: AC
  Administered 2021-08-13: 1 mg via ORAL
  Filled 2021-08-13 (×3): qty 1

## 2021-08-13 NOTE — ED Provider Notes (Signed)
[name] EMERGENCY DEPARTMENT Provider Note   CSN: [id number] Arrival date & time: 08/13/21  0003     History No chief complaint on file.   Cole Barrett is a 35 y.o. male.  Patient here with past medical history of schizophrenia and hallucinations.  He states that he needs a dose of his Risperdal, as he is not able to make it home to take his evening dose.  He denies any other complaints tonight.       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     Family History  Problem Relation Age of Onset   Hypertension Mother     Social History   Tobacco Use   Smoking status: Every Day    Packs/day: 0.50    Types: Cigarettes   Smokeless tobacco: Never  Vaping Use   Vaping Use: Never used  Substance Use Topics   Alcohol use: Yes    Comment: BAC not available   Drug use: Yes    Types: Marijuana    Comment: Last used: 2 months ago UDS NA    Home Medications Prior to Admission medications   Medication Sig Start Date End Date Taking? Authorizing Provider  ibuprofen (ADVIL) 600  MG tablet Take 1 tablet (600 mg total) by mouth every 6 (six) hours as needed. 03/15/21   Linwood Dibbles, MD  risperiDONE (RISPERDAL) 1 MG tablet Take 1 tablet (1 mg total) by mouth at bedtime. 05/13/21 06/12/21  Theron Arista, PA-C  triamcinolone cream (KENALOG) 0.1 % Apply 1 application topically 2 (two) times daily. 05/16/21   Palumbo, April, MD  amantadine (SYMMETREL) 100 MG capsule Take 1 capsule (100 mg total) by mouth 2 (two) times daily. Patient not taking: Reported on 11/04/2019 09/01/19 11/17/19  [name], NP    Allergies    Haldol [haloperidol]  Review of Systems   Review of Systems  All other systems reviewed and are negative.  Physical Exam Updated Vital Signs BP 120/78 (BP Location: Left Arm)   Pulse 73   Temp 98.2 F (36.8 C) (Oral)   Resp 15   SpO2 99%   Physical Exam Vitals and nursing note reviewed.  Constitutional:      General: He is not in acute distress.    Appearance: He is well-developed. He is not ill-appearing.  HENT:     Head: Normocephalic and atraumatic.  Eyes:     Conjunctiva/sclera: Conjunctivae normal.  Cardiovascular:     Rate and Rhythm: Normal rate.  Pulmonary:     Effort: Pulmonary effort is normal. No respiratory distress.  Abdominal:  General: There is no distension.  Musculoskeletal:     Cervical back: Neck supple.     Comments: Moves all extremities  Skin:    General: Skin is warm and dry.  Neurological:     Mental Status: He is alert and oriented to person, place, and time.  Psychiatric:        Mood and Affect: Mood normal.        Behavior: Behavior normal.    ED Results / Procedures / Treatments   Labs (all labs ordered are listed, but only abnormal results are displayed) Labs Reviewed - No data to display  EKG None  Radiology No results found.  Procedures Procedures   Medications Ordered in ED Medications  risperiDONE (RISPERDAL) tablet 1 mg (has no administration in time range)    ED Course  I have  reviewed the triage vital signs and the nursing notes.  Pertinent labs & imaging results that were available during my care of the patient were reviewed by me and considered in my medical decision making (see chart for details).    MDM Rules/Calculators/A&P                          Patient here requesting his evening dose of Risperdal.  His vital signs are stable.  He is alert and oriented, but may have some alcohol on board.  However, he does not appear intoxicated to the point to not being functional or to the point that he would be a danger to himself or others.  I do not think that he requires further emergent work-up now  Final Clinical Impression(s) / ED Diagnoses Final diagnoses:  Hallucinations    Rx / DC Orders ED Discharge Orders     None        Roxy Horseman, PA-C 08/13/21 0021    Koleen Distance, MD 08/13/21 [phone]

## 2021-08-13 NOTE — ED Triage Notes (Signed)
Patient looking for a dose of his Risperdal.  Patient hearing voices, denies any SI and HI.

## 2021-08-25 ENCOUNTER — Encounter (HOSPITAL_COMMUNITY): Payer: Self-pay | Admitting: Emergency Medicine

## 2021-08-25 ENCOUNTER — Emergency Department (HOSPITAL_COMMUNITY)
Admission: EM | Admit: 2021-08-25 | Discharge: 2021-08-25 | Disposition: A | Discharge status: Home / Self Care | Payer: Self-pay | Attending: Emergency Medicine | Admitting: Emergency Medicine

## 2021-08-25 DIAGNOSIS — Z79899 Other long term (current) drug therapy: Secondary | ICD-10-CM

## 2021-08-25 DIAGNOSIS — Z8616 Personal history of COVID-19: Secondary | ICD-10-CM | POA: Insufficient documentation

## 2021-08-25 DIAGNOSIS — F17210 Nicotine dependence, cigarettes, uncomplicated: Secondary | ICD-10-CM | POA: Insufficient documentation

## 2021-08-25 DIAGNOSIS — Z760 Encounter for issue of repeat prescription: Secondary | ICD-10-CM | POA: Insufficient documentation

## 2021-08-25 MED ORDER — RISPERIDONE 1 MG PO TABS
1.0000 mg | ORAL_TABLET | Freq: Once | ORAL | Status: AC
  Administered 2021-08-25: 1 mg via ORAL
  Filled 2021-08-25 (×2): qty 1

## 2021-08-25 NOTE — Discharge Instructions (Signed)
Please make sure to take your medication regularly and have access to your medication at all time.

## 2021-08-25 NOTE — ED Triage Notes (Signed)
Patient with documented history of schizophrenia requesting refill of risperidone prescription. States he is "hearing voices", last dose was yesterday morning. Denies suicidal and homicidal ideations.

## 2021-08-25 NOTE — ED Provider Notes (Signed)
[facility] EMERGENCY DEPARTMENT Provider Note   CSN: [id number] Arrival date & time: 08/25/21  0734     History Chief Complaint  Patient presents with   Medication Refill    Cole Barrett is a 35 y.o. male.  The history is provided by the patient and medical records. No language interpreter was used.  Medication Refill  35 year old male significant history of schizophrenia, homelessness, hypercholesterolemia who presenting requesting for medication refill.  Patient states he is on his way to go to work today and travel across town but forgot to take his Risperdal.  States that he needs this medication daily to help avoid psychosis.  He is here requesting for dose of his Risperdal but states that he does have access to the medication at home.  Denies any SI HI or any other symptoms at this time.  Last dose was yesterday.  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     Family History  Problem Relation Age of Onset   Hypertension Mother     Social History   Tobacco Use   Smoking  status: Every Day    Packs/day: 0.50    Types: Cigarettes   Smokeless tobacco: Never  Vaping Use   Vaping Use: Never used  Substance Use Topics   Alcohol use: Yes    Comment: BAC not available   Drug use: Yes    Types: Marijuana    Comment: Last used: 2 months ago UDS NA    Home Medications Prior to Admission medications   Medication Sig Start Date End Date Taking? Authorizing Provider  ibuprofen (ADVIL) 600 MG tablet Take 1 tablet (600 mg total) by mouth every 6 (six) hours as needed. 03/15/21   Linwood Dibbles, MD  risperiDONE (RISPERDAL) 1 MG tablet Take 1 tablet (1 mg total) by mouth at bedtime. 05/13/21 06/12/21  Theron Arista, PA-C  triamcinolone cream (KENALOG) 0.1 % Apply 1 application topically 2 (two) times daily. 05/16/21   Palumbo, April, MD  amantadine (SYMMETREL) 100 MG capsule Take 1 capsule (100 mg total) by mouth 2 (two) times daily. Patient not taking: Reported on 11/04/2019 09/01/19 11/17/19  [name], NP    Allergies    Haldol [haloperidol]  Review of Systems   Review of Systems  Neurological:  Negative for headaches.  Psychiatric/Behavioral:  Negative for confusion, hallucinations and suicidal ideas.    Physical Exam Updated Vital Signs BP 130/81 (BP Location: Left Arm)   Pulse 79   Temp 97.9 F (36.6 C) (Oral)   Resp 18   SpO2 99%   Physical Exam  Vitals and nursing note reviewed.  Constitutional:      General: He is not in acute distress.    Appearance: He is well-developed.  HENT:     Head: Atraumatic.  Eyes:     Conjunctiva/sclera: Conjunctivae normal.  Musculoskeletal:     Cervical back: Neck supple.  Skin:    Findings: No rash.  Neurological:     Mental Status: He is alert.  Psychiatric:        Mood and Affect: Mood normal.        Speech: Speech normal.        Behavior: Behavior is cooperative.        Thought Content: Thought content is not paranoid or delusional. Thought content does not include homicidal or suicidal ideation.    ED  Results / Procedures / Treatments   Labs (all labs ordered are listed, but only abnormal results are displayed) Labs Reviewed - No data to display  EKG None  Radiology No results found.  Procedures Procedures   Medications Ordered in ED Medications  risperiDONE (RISPERDAL) tablet 1 mg (has no administration in time range)    ED Course  I have reviewed the triage vital signs and the nursing notes.  Pertinent labs & imaging results that were available during my care of the patient were reviewed by me and considered in my medical decision making (see chart for details).    MDM Rules/Calculators/A&P                           BP 130/81 (BP Location: Left Arm)   Pulse 79   Temp 97.9 F (36.6 C) (Oral)   Resp 18   SpO2 99%   Final Clinical Impression(s) / ED Diagnoses Final diagnoses:  Medication management    Rx / DC Orders ED Discharge Orders     None      8:11 AM Patient who is well-known to the ED with documented history of schizophrenia currently on Risperdal 1 mg daily.  He is here requesting for Risperdal because he did not have access to it at this time but does have medication available at home.  We will give 1 mg of Risperdal here and patient otherwise stable for discharge.  Encourage patient to make sure to have his medication available for regular use.   Fayrene Helper, PA-C 08/25/21 1144    Milagros Loll, MD 08/26/21 1050

## 2021-08-27 ENCOUNTER — Other Ambulatory Visit: Payer: Self-pay

## 2021-08-27 ENCOUNTER — Encounter (HOSPITAL_COMMUNITY): Payer: Self-pay | Admitting: *Deleted

## 2021-08-27 ENCOUNTER — Emergency Department (HOSPITAL_COMMUNITY)
Admission: EM | Admit: 2021-08-27 | Discharge: 2021-08-27 | Disposition: A | Discharge status: Home / Self Care | Payer: Self-pay | Attending: Physician Assistant | Admitting: Physician Assistant

## 2021-08-27 DIAGNOSIS — Z8616 Personal history of COVID-19: Secondary | ICD-10-CM | POA: Insufficient documentation

## 2021-08-27 DIAGNOSIS — R443 Hallucinations, unspecified: Secondary | ICD-10-CM | POA: Insufficient documentation

## 2021-08-27 DIAGNOSIS — Z760 Encounter for issue of repeat prescription: Secondary | ICD-10-CM | POA: Insufficient documentation

## 2021-08-27 DIAGNOSIS — F17210 Nicotine dependence, cigarettes, uncomplicated: Secondary | ICD-10-CM | POA: Insufficient documentation

## 2021-08-27 MED ORDER — RISPERIDONE 1 MG PO TABS
1.0000 mg | ORAL_TABLET | Freq: Once | ORAL | Status: AC
  Administered 2021-08-27: 1 mg via ORAL
  Filled 2021-08-27 (×2): qty 1

## 2021-08-27 NOTE — ED Provider Notes (Signed)
[facility] EMERGENCY DEPARTMENT Provider Note   CSN: [id number] Arrival date & time: 08/27/21  2048     History No chief complaint on file.   Cole Barrett is a 35 y.o. male.  HPI   35 y/o m presenting for eval requesting his nightly dose of risperidol. He states he has to stay across town tonight because of the rain and his medications are at home so he is note able to take them. Reports avh but denies si or hi. He has no other complaints  Past Medical History:  Diagnosis Date   COVID-19    Homelessness    Hypercholesterolemia    Mental disorder    Paranoid behavior (HCC)    PE (pulmonary embolism)    Schizophrenia (HCC)     Patient Active Problem List   Diagnosis Date Noted   Acute psychosis (HCC) 08/31/2019   Schizophrenia (HCC) 08/31/2019   Schizoaffective disorder, bipolar type (HCC) 01/19/2019   Alcohol abuse    Auditory hallucinations    Alcohol abuse with alcohol-induced mood disorder (HCC) 09/08/2016   Homelessness 09/03/2016   Person feigning illness 09/03/2016   Brief psychotic disorder (HCC) 08/29/2016   Cannabis use disorder, mild, abuse 08/29/2016   Pulmonary emboli (HCC) 07/13/2016   Adjustment disorder with emotional disturbance 03/12/2014   Pulmonary embolism and infarction (HCC) 01/02/2014   Acute left flank pain 01/02/2014   Pulmonary embolism, bilateral (HCC) 01/02/2014    No past surgical history on file.     Family History  Problem Relation Age of Onset   Hypertension Mother     Social History   Tobacco Use   Smoking status: Every Day    Packs/day: 0.50    Types: Cigarettes   Smokeless tobacco: Never  Vaping Use   Vaping Use: Never used  Substance Use Topics   Alcohol use: Yes    Comment: BAC not available   Drug use: Yes    Types: Marijuana    Comment: Last used: 2 months ago UDS NA    Home Medications Prior to Admission medications   Medication Sig Start Date End Date Taking? Authorizing  Provider  ibuprofen (ADVIL) 600 MG tablet Take 1 tablet (600 mg total) by mouth every 6 (six) hours as needed. 03/15/21   Linwood Dibbles, MD  risperiDONE (RISPERDAL) 1 MG tablet Take 1 tablet (1 mg total) by mouth at bedtime. 05/13/21 06/12/21  Theron Arista, PA-C  triamcinolone cream (KENALOG) 0.1 % Apply 1 application topically 2 (two) times daily. 05/16/21   Palumbo, April, MD  amantadine (SYMMETREL) 100 MG capsule Take 1 capsule (100 mg total) by mouth 2 (two) times daily. Patient not taking: Reported on 11/04/2019 09/01/19 11/17/19  [name], NP    Allergies    Haldol [haloperidol]  Review of Systems   Review of Systems  Constitutional:  Negative for fever.  Psychiatric/Behavioral:  Positive for hallucinations.        No hi or si   Physical Exam Updated Vital Signs BP (!) 150/94 (BP Location: Right Arm)   Pulse 98   Temp 98.5 F (36.9 C) (Oral)   Resp 14   SpO2 96%   Physical Exam Constitutional:      General: He is not in acute distress.    Appearance: He is well-developed.  Eyes:     Conjunctiva/sclera: Conjunctivae normal.  Cardiovascular:     Rate and Rhythm: Normal rate.  Pulmonary:     Effort: Pulmonary effort is normal.  Abdominal:     General: Abdomen is flat.  Skin:    General: Skin is warm and dry.  Neurological:     Mental Status: He is alert and oriented to person, place, and time.  Psychiatric:        Mood and Affect: Mood normal.        Behavior: Behavior normal.        Thought Content: Thought content normal.     Comments: No si or hi    ED Results / Procedures / Treatments   Labs (all labs ordered are listed, but only abnormal results are displayed) Labs Reviewed - No data to display  EKG None  Radiology No results found.  Procedures Procedures   Medications Ordered in ED Medications  risperiDONE (RISPERDAL) tablet 1 mg (has no administration in time range)    ED Course  I have reviewed the triage vital signs and the nursing  notes.  Pertinent labs & imaging results that were available during my care of the patient were reviewed by me and considered in my medical decision making (see chart for details).    MDM Rules/Calculators/A&P                          Pt here requesting his nightly dose of risperidol. Seen frequently for same. No emergent psychiatric complaints tonight. Pt educated on proper use of the ed and is given info for outpt f/u for his psychiatric needs. He voices understanding.    Final Clinical Impression(s) / ED Diagnoses Final diagnoses:  Hallucination    Rx / DC Orders ED Discharge Orders     None        Karrie Meres, PA-C 08/27/21 2118    Virgina Norfolk, DO 08/27/21 2159

## 2021-08-27 NOTE — ED Notes (Signed)
Pt checked out before his mse was signed

## 2021-08-27 NOTE — ED Triage Notes (Signed)
The pt is out of resperidone see the pas bites she triaged this pt

## 2021-08-29 ENCOUNTER — Other Ambulatory Visit: Payer: Self-pay

## 2021-08-29 ENCOUNTER — Ambulatory Visit (HOSPITAL_COMMUNITY)
Admission: EM | Admit: 2021-08-29 | Discharge: 2021-08-29 | Disposition: A | Discharge status: Home / Self Care | Payer: No Payment, Other | Attending: Behavioral Health | Admitting: Behavioral Health

## 2021-08-29 ENCOUNTER — Emergency Department (HOSPITAL_COMMUNITY)
Admission: EM | Admit: 2021-08-29 | Discharge: 2021-08-29 | Disposition: A | Discharge status: Other Acute Inpatient Hospital | Payer: Self-pay | Attending: Emergency Medicine | Admitting: Emergency Medicine

## 2021-08-29 ENCOUNTER — Encounter (HOSPITAL_COMMUNITY): Payer: Self-pay

## 2021-08-29 DIAGNOSIS — Z79899 Other long term (current) drug therapy: Secondary | ICD-10-CM | POA: Insufficient documentation

## 2021-08-29 DIAGNOSIS — R443 Hallucinations, unspecified: Secondary | ICD-10-CM | POA: Insufficient documentation

## 2021-08-29 DIAGNOSIS — Z5321 Procedure and treatment not carried out due to patient leaving prior to being seen by health care provider: Secondary | ICD-10-CM | POA: Insufficient documentation

## 2021-08-29 DIAGNOSIS — F209 Schizophrenia, unspecified: Secondary | ICD-10-CM | POA: Insufficient documentation

## 2021-08-29 MED ORDER — RISPERIDONE 1 MG PO TABS
1.0000 mg | ORAL_TABLET | Freq: Once | ORAL | Status: AC
  Administered 2021-08-29: 1 mg via ORAL
  Filled 2021-08-29: qty 1

## 2021-08-29 NOTE — ED Notes (Signed)
Called Safe transport for patient, they will arrive shortly.

## 2021-08-29 NOTE — ED Provider Notes (Signed)
Emergency Medicine Provider Triage Evaluation Note  Cole Barrett , a 35 y.o. male  was evaluated in triage.  Pt complains of hallucinations. Patient reports hearing voices stating they are going to kill him. He is requesting to speak with a psychiatric provider. No SI/HI. States he has been taking his Risperdal. Is supposed to received OP psychiatric care through [place]. Hx of frequent utilization of the ED.  Review of Systems  Positive: Hallucinations  Negative: SI/HI  Physical Exam  There were no vitals taken for this visit. Gen:   Awake, no distress   Resp:  Normal effort  MSK:   Moves extremities without difficulty  Other:  Calm, cooperative. Not reacting to internal stimuli  Medical Decision Making  Medically screening exam initiated at 3:00 AM.  Appropriate orders placed.  Cole Barrett was informed that the remainder of the evaluation will be completed by another provider, this initial triage assessment does not replace that evaluation, and the importance of remaining in the ED until their evaluation is complete.  Hallucinations   [name], PA-C 08/29/21 0301    Shon Baton, MD 08/30/21 [phone]

## 2021-08-29 NOTE — ED Triage Notes (Signed)
Pt states that he is hearing the voices, denies SI/HI

## 2021-08-29 NOTE — Progress Notes (Signed)
   08/29/21 0807  [facility] Triage Screening (Walk-ins at [facility] only)  How Did You Hear About [place]? Self  What Is the Reason for Your Visit/Call Today? (Routine) Cole Barrett , a 35 y.o. male  initially presented to [facility] for an evaluation then transported to [facility] by safe transport. Per ED note Pt complains of hallucinations. Patient reports hearing voices stating they are going to kill him. He is requesting to speak with a psychiatric provider. No SI/HI. States he has been taking his Risperdal. Is supposed to received OP psychiatric care through [place]. Hx of frequent utilization of the ED. Patient report he's on this side of town and needs his medication Risperdal. Denied suicidal/homicidal ideations. Report he always hears voices and the medication makes them tolerable. Denied visual hallucinations. Patient is ready to be seen by provider.  How Long Has This Been Causing You Problems? <Week  Have You Recently Had Any Thoughts About Hurting Yourself? No  Are You Planning to Commit Suicide/Harm Yourself At This time? No  Have you Recently Had Thoughts About Hurting Someone [name]? No  Are You Planning To Harm Someone At This Time? No  Are you currently experiencing any auditory, visual or other hallucinations? Yes  Please explain the hallucinations you are currently experiencing: report always hears voices, voices not commanding or interfering with daily living  Have You Used Any Alcohol or Drugs in the Past 24 Hours? No  Do you have any current medical co-morbidities that require immediate attention? No  Clinician description of patient physical appearance/behavior: Patient pleasant and cooperative during triage  What Do You Feel Would Help You the Most Today? Medication(s)  If access to [facility] was not available, would you have sought care in the Emergency Department? Yes  Determination of Need Routine (7 days)  Options For Referral Medication Management

## 2021-08-29 NOTE — ED Provider Notes (Addendum)
Behavioral Health Urgent Care Medical Screening Exam  Patient Name: Cole Barrett MRN: [id number] Date of Evaluation: 08/29/21 Diagnosis:  Final diagnoses:  Schizophrenia, unspecified type (HCC)    History of Present illness: Cole Barrett is a 35 y.o. male who initially presented to [facility] for an evaluation then transported to [facility] by safe transport. Per ED note, pt complains of hallucinations,hearing voices stating they are going to kill him. He is requesting to speak with a psychiatric provider.  Patient seen and evaluated face-to-face by this provider and chart reviewed. Patient has a past hx significant for schizophrenia and frequent utilization of the ED with similar presentation. On evaluation, patient is alert and oriented x4. His thought process is circumstantial and speech is coherent. He is casually dressed in a sweat suit. He states that he is trying to get his medication because he did not have his medications last night when he got off work. 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 He reports living alone in a house. He states that he goes to [facility] to get his medications filled. He reports taking Risperdal 1 mg two times a day x couple years. He is requesting a dose of Risperdal to help with the voices. He states that he last took Risperdal on Saturday. He describes the voices as "hearing voices every day" for a couple years. He is unable to describe the voices in detail and denies command voices. He states that the Risperdal calms the voices down. He denies visual hallucinations. He denies feeling paranoid like someone is out to get him or spying on him. He does not appear to be responding to internal or external stimuli. He denies suicidal ideations. He denies homicidal ideations. He denies depressive and anxiety symptoms. He reports fair sleep at night, on average he sleeps 8 hours. He reports having a good  appetite. He denies using drugs including heroin, cocaine and marijuana. He denies drinking alcohol. He reports that he used to follow up at [facility] for outpatient psychiatry but stopped going. I discussed with the patient obtaining an EKG, and taking a dose of Risperdal 1 mg by mouth now. Patient was held and monitored for medication side effects, none noted. He is advised to follow up at the [facility] outpatient for medication management and therapy. He verbalizes understanding and agrees to the stated plan. He will be transported home via public transportation and provided a bus pass.  Psychiatric Specialty Exam  Presentation  General Appearance:Appropriate for Environment  Eye Contact:Fair  Speech:Clear and Coherent  Speech Volume:Normal  Handedness:Right   Mood and Affect  Mood:Euthymic  Affect:Appropriate   Thought Process  Thought Processes:Coherent; Goal Directed  Descriptions of Associations:Intact  Orientation:Full (Time, Place and Person)  Thought Content:WDL  Diagnosis of Schizophrenia or Schizoaffective disorder in past: Yes  Duration of Psychotic Symptoms: Greater than six months  Hallucinations:Auditory hearing loud voices that wants to kill him.  Ideas of Reference:None  Suicidal Thoughts:No  Homicidal Thoughts:No   Sensorium  Memory:Immediate Fair; Recent Fair; Remote Fair  Judgment:Fair  Insight:Fair   Executive Functions  Concentration:Fair  Attention Span:Fair  Recall:Fair  Fund of Knowledge:Fair  Language:Fair   Psychomotor Activity  Psychomotor Activity:Normal   Assets  Assets:Communication Skills; Desire for Improvement; Housing; Leisure Time; Financial Resources/Insurance   Sleep  Sleep:Fair  Number of hours: 8   Physical Exam: Physical Exam Constitutional:      Appearance: Normal appearance.  HENT:  Head: Atraumatic.  Cardiovascular:     Rate and Rhythm: Normal rate.  Pulmonary:     Effort: Pulmonary effort  is normal.  Musculoskeletal:     Cervical back: Normal range of motion.  Neurological:     Mental Status: He is alert and oriented to person, place, and time.   Review of Systems  Constitutional: Negative.   HENT: Negative.    Eyes: Negative.   Respiratory: Negative.    Cardiovascular: Negative.   Gastrointestinal: Negative.   Genitourinary: Negative.   Musculoskeletal: Negative.   Skin: Negative.   Neurological: Negative.   Endo/Heme/Allergies: Negative.   Psychiatric/Behavioral:  Positive for hallucinations.   Blood pressure 128/79, pulse 87, temperature 98 F (36.7 C), temperature source Oral, resp. rate 18, height 5\' 11"  (1.803 m), weight 98.9 kg, SpO2 99 %. Body mass index is 30.4 kg/m.  Musculoskeletal: Strength & Muscle Tone: within normal limits Gait & Station: normal Patient leans: N/A   [facility] MSE Discharge Disposition for Follow up and Recommendations: Based on my evaluation the patient does not appear to have an emergency medical condition and can be discharged with resources and follow up care in outpatient services for Medication Management, Individual Therapy, and Group Therapy  Follow up:   Follow-up Information     Go to  [facility]   Specialty: Urgent Care Why: Open access walk-in hours Monday - Thursday 8 am to 11 am for medication management and therapy or call to schedule an appointment Contact information: [phone] [place] [phone]                 [phone], NP 08/29/2021, 8:15 AM

## 2021-08-29 NOTE — ED Notes (Signed)
Discharge instructions provided and Pt stated understanding. Pt alert, orient and ambulatory prior to d/c from facility. Safety maintained.   

## 2021-08-29 NOTE — Discharge Instructions (Addendum)

## 2021-08-30 ENCOUNTER — Encounter (HOSPITAL_COMMUNITY): Payer: Self-pay | Admitting: Emergency Medicine

## 2021-08-30 ENCOUNTER — Emergency Department (HOSPITAL_COMMUNITY)
Admission: EM | Admit: 2021-08-30 | Discharge: 2021-08-30 | Disposition: A | Discharge status: Home / Self Care | Payer: Self-pay | Attending: Emergency Medicine | Admitting: Emergency Medicine

## 2021-08-30 DIAGNOSIS — Z760 Encounter for issue of repeat prescription: Secondary | ICD-10-CM | POA: Insufficient documentation

## 2021-08-30 DIAGNOSIS — F17210 Nicotine dependence, cigarettes, uncomplicated: Secondary | ICD-10-CM | POA: Insufficient documentation

## 2021-08-30 DIAGNOSIS — Z8616 Personal history of COVID-19: Secondary | ICD-10-CM | POA: Insufficient documentation

## 2021-08-30 MED ORDER — RISPERIDONE 1 MG PO TABS
1.0000 mg | ORAL_TABLET | Freq: Once | ORAL | Status: AC
  Administered 2021-08-30: 1 mg via ORAL
  Filled 2021-08-30: qty 1

## 2021-08-30 MED ORDER — RISPERIDONE 1 MG PO TABS
1.0000 mg | ORAL_TABLET | Freq: Every day | ORAL | 0 refills | Status: DC
Start: 2021-08-30 — End: 2021-10-15

## 2021-08-30 NOTE — ED Provider Notes (Signed)
[facility]-EMERGENCY DEPT Provider Note   CSN: [id number] Arrival date & time: 08/30/21  0215     History Chief Complaint  Patient presents with   Medication Refill    Cole Barrett is a 35 y.o. male.  The history is provided by the patient and medical records.  Medication Refill  35 year old male with homelessness, schizophrenia, presenting to the ED requesting dose of his Risperdal and refill.  States he was given his Risperdal last night in the ED but did not get a new prescription.  He has history of schizophrenia and chronic hallucinations (hearing voices).  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 No pertinent surgical history.     Family History  Problem Relation Age of Onset   Hypertension Mother     Social History   Tobacco Use   Smoking status: Every Day    Packs/day: 0.50    Types: Cigarettes   Smokeless tobacco: Never  Vaping Use   Vaping Use: Never used  Substance Use Topics   Alcohol use: Yes    Comment: BAC not available   Drug use: Yes    Types: Marijuana    Comment:  Last used: 2 months ago UDS NA    Home Medications Prior to Admission medications   Medication Sig Start Date End Date Taking? Authorizing Provider  risperiDONE (RISPERDAL) 1 MG tablet Take 1 tablet (1 mg total) by mouth at bedtime. 08/30/21  Yes Garlon Hatchet, PA-C  ibuprofen (ADVIL) 600 MG tablet Take 1 tablet (600 mg total) by mouth every 6 (six) hours as needed. 03/15/21   Linwood Dibbles, MD  triamcinolone cream (KENALOG) 0.1 % Apply 1 application topically 2 (two) times daily. 05/16/21   Palumbo, April, MD  amantadine (SYMMETREL) 100 MG capsule Take 1 capsule (100 mg total) by mouth 2 (two) times daily. Patient not taking: Reported on 11/04/2019 09/01/19 11/17/19  [name], NP    Allergies    Haldol [haloperidol]  Review of Systems   Review of Systems  Constitutional:        Med refill  All other systems reviewed and are negative.  Physical Exam Updated Vital Signs BP (!) 149/82 (BP Location: Left Arm)   Pulse (!) 108   Temp 97.9 F (36.6 C) (Oral)   Resp 18   SpO2 96%   Physical Exam Vitals and nursing note reviewed.  Constitutional:      Appearance: He is well-developed.  HENT:     Head: Normocephalic and atraumatic.  Eyes:     Conjunctiva/sclera: Conjunctivae normal.     Pupils: Pupils are equal, round,  and reactive to light.  Cardiovascular:     Rate and Rhythm: Normal rate and regular rhythm.     Heart sounds: Normal heart sounds.  Pulmonary:     Effort: Pulmonary effort is normal.     Breath sounds: Normal breath sounds.  Abdominal:     General: Bowel sounds are normal.     Palpations: Abdomen is soft.  Musculoskeletal:        General: Normal range of motion.     Cervical back: Normal range of motion.  Skin:    General: Skin is warm and dry.  Neurological:     Mental Status: He is alert and oriented to person, place, and time.    ED Results / Procedures / Treatments   Labs (all labs ordered are listed, but only abnormal results are  displayed) Labs Reviewed - No data to display  EKG None  Radiology No results found.  Procedures Procedures   Medications Ordered in ED Medications  risperiDONE (RISPERDAL) tablet 1 mg (1 mg Oral Given 08/30/21 0445)    ED Course  I have reviewed the triage vital signs and the nursing notes.  Pertinent labs & imaging results that were available during my care of the patient were reviewed by me and considered in my medical decision making (see chart for details).    MDM Rules/Calculators/A&P                           35 year old male here requesting med refill.  Has history of schizophrenia with frequent ED use.  Admits to ongoing hallucinations (hearing voices) which is chronic for him.  He is given dose of Resporal here and new prescription sent to his pharmacy.  Encouraged to follow-up with his psychiatry team.  Can return here for new concerns.  Final Clinical Impression(s) / ED Diagnoses Final diagnoses:  Medication refill    Rx / DC Orders ED Discharge Orders          Ordered    risperiDONE (RISPERDAL) 1 MG tablet  Daily at bedtime        08/30/21 0447             Garlon Hatchet, PA-C 08/30/21 0451    Pricilla Loveless, MD 08/30/21 1513

## 2021-08-30 NOTE — Discharge Instructions (Signed)
Refill of risperdal sent to pharmacy. Follow-up with your psychiatry team. Return here for new concerns.

## 2021-08-30 NOTE — ED Triage Notes (Signed)
Pt states he is off his medication since last night and needs a refill. Pt has hx of schizophrenia and is hearing voices.

## 2021-08-31 ENCOUNTER — Emergency Department (HOSPITAL_COMMUNITY)
Admission: EM | Admit: 2021-08-31 | Discharge: 2021-08-31 | Disposition: A | Discharge status: Home / Self Care | Payer: Self-pay | Attending: Emergency Medicine | Admitting: Emergency Medicine

## 2021-08-31 DIAGNOSIS — F17210 Nicotine dependence, cigarettes, uncomplicated: Secondary | ICD-10-CM | POA: Insufficient documentation

## 2021-08-31 DIAGNOSIS — Z8616 Personal history of COVID-19: Secondary | ICD-10-CM | POA: Insufficient documentation

## 2021-08-31 DIAGNOSIS — R440 Auditory hallucinations: Secondary | ICD-10-CM | POA: Insufficient documentation

## 2021-08-31 MED ORDER — RISPERIDONE 1 MG PO TABS
1.0000 mg | ORAL_TABLET | Freq: Once | ORAL | Status: AC
  Administered 2021-08-31: 1 mg via ORAL
  Filled 2021-08-31: qty 1

## 2021-08-31 NOTE — ED Provider Notes (Signed)
[name] EMERGENCY DEPARTMENT Provider Note   CSN: [id number] Arrival date & time: 08/31/21  0132     History No chief complaint on file.   Cole Barrett is a 35 y.o. male.  Patient presents to the emergency department with a chief complaint of hearing voices.  This is a chronic complaint for the patient.  He takes Risperdal for this.  He is seen frequently to get a dose of his home Risperdal.  He states that he is on the other side of town today, and is away from his medications.  He is requesting 1 dose of his Risperdal.  The history is provided by the patient. No language interpreter was used.      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     Family History  Problem Relation Age of Onset   Hypertension Mother     Social History   Tobacco Use   Smoking status: Every Day    Packs/day: 0.50    Types: Cigarettes   Smokeless tobacco: Never  Vaping Use   Vaping Use: Never used  Substance Use Topics   Alcohol use: Yes    Comment: BAC not available   Drug use: Yes    Types:  Marijuana    Comment: Last used: 2 months ago UDS NA    Home Medications Prior to Admission medications   Medication Sig Start Date End Date Taking? Authorizing Provider  ibuprofen (ADVIL) 600 MG tablet Take 1 tablet (600 mg total) by mouth every 6 (six) hours as needed. 03/15/21   Linwood Dibbles, MD  risperiDONE (RISPERDAL) 1 MG tablet Take 1 tablet (1 mg total) by mouth at bedtime. 08/30/21   Garlon Hatchet, PA-C  triamcinolone cream (KENALOG) 0.1 % Apply 1 application topically 2 (two) times daily. 05/16/21   Palumbo, April, MD  amantadine (SYMMETREL) 100 MG capsule Take 1 capsule (100 mg total) by mouth 2 (two) times daily. Patient not taking: Reported on 11/04/2019 09/01/19 11/17/19  [name], NP    Allergies    Haldol [haloperidol]  Review of Systems   Review of Systems  All other systems reviewed and are negative.  Physical Exam Updated Vital Signs BP 124/68 (BP Location: Left Arm)   Pulse 78   Temp 98.3 F (36.8 C)   Resp 14   SpO2 97%   Physical Exam Vitals and nursing note reviewed.  Constitutional:      General: He is not in acute distress.    Appearance: He is well-developed. He is not ill-appearing.  HENT:     Head:  Normocephalic and atraumatic.  Eyes:     Conjunctiva/sclera: Conjunctivae normal.  Cardiovascular:     Rate and Rhythm: Normal rate.  Pulmonary:     Effort: Pulmonary effort is normal. No respiratory distress.  Abdominal:     General: There is no distension.  Musculoskeletal:     Cervical back: Neck supple.     Comments: Moves all extremities  Skin:    General: Skin is warm and dry.  Neurological:     Mental Status: He is alert and oriented to person, place, and time.  Psychiatric:        Mood and Affect: Mood normal.        Behavior: Behavior normal.    ED Results / Procedures / Treatments   Labs (all labs ordered are listed, but only abnormal results are displayed) Labs Reviewed - No data to display  EKG None  Radiology No  results found.  Procedures Procedures   Medications Ordered in ED Medications - No data to display  ED Course  I have reviewed the triage vital signs and the nursing notes.  Pertinent labs & imaging results that were available during my care of the patient were reviewed by me and considered in my medical decision making (see chart for details).    MDM Rules/Calculators/A&P                            Final Clinical Impression(s) / ED Diagnoses Final diagnoses:  Auditory hallucination    Rx / DC Orders ED Discharge Orders     None        Roxy Horseman, Cordelia Poche 08/31/21 0932    Palumbo, April, MD 08/31/21 [phone]

## 2021-08-31 NOTE — ED Triage Notes (Signed)
Pt requesting Risperidone. Denies new complaints

## 2021-09-01 ENCOUNTER — Emergency Department (HOSPITAL_COMMUNITY)
Admission: EM | Admit: 2021-09-01 | Discharge: 2021-09-01 | Disposition: A | Discharge status: Home / Self Care | Payer: Self-pay | Attending: Emergency Medicine | Admitting: Emergency Medicine

## 2021-09-01 DIAGNOSIS — R440 Auditory hallucinations: Secondary | ICD-10-CM | POA: Insufficient documentation

## 2021-09-01 DIAGNOSIS — Z8616 Personal history of COVID-19: Secondary | ICD-10-CM | POA: Insufficient documentation

## 2021-09-01 DIAGNOSIS — F17210 Nicotine dependence, cigarettes, uncomplicated: Secondary | ICD-10-CM | POA: Insufficient documentation

## 2021-09-01 DIAGNOSIS — Z760 Encounter for issue of repeat prescription: Secondary | ICD-10-CM | POA: Insufficient documentation

## 2021-09-01 MED ORDER — RISPERIDONE 1 MG PO TABS
1.0000 mg | ORAL_TABLET | Freq: Once | ORAL | Status: AC
  Administered 2021-09-01: 1 mg via ORAL
  Filled 2021-09-01 (×2): qty 1

## 2021-09-01 NOTE — ED Notes (Signed)
Called patient, no response. Patients belongings are in the lobby but the patient is not.

## 2021-09-01 NOTE — ED Triage Notes (Signed)
Pt reports having auditory hallucinations and has been without his meds x 5 days. Denies SI or HI.

## 2021-09-01 NOTE — ED Notes (Signed)
Labs drawn and urine sent to main lab

## 2021-09-01 NOTE — ED Provider Notes (Signed)
[facility] EMERGENCY DEPARTMENT Provider Note   CSN: [id number] Arrival date & time: 09/01/21  0749     History Chief Complaint  Patient presents with   Hallucinations    Cole Barrett is a 35 y.o. male presenting for auditory hallucinations and requesting his medication.  Patient states he takes Risperdal for hallucinations.  He has been working on the other side of town from where his medication is, thus not been able to take it recently.  He states this is his last day on the side of town.  He reports his auditory hallucinations are unchanged from baseline.  They are not threatening.  He denies SI or HI.  He has no other symptoms.  No other concerns other than requesting his Risperdal.   HPI     Past Medical History:  Diagnosis Date   COVID-19    Homelessness    Hypercholesterolemia    Mental disorder    Paranoid behavior (HCC)    PE (pulmonary embolism)    Schizophrenia (HCC)     Patient Active Problem List   Diagnosis Date Noted   Acute psychosis (HCC) 08/31/2019   Schizophrenia (HCC) 08/31/2019   Schizoaffective disorder, bipolar type (HCC) 01/19/2019   Alcohol abuse    Auditory hallucinations    Alcohol abuse with alcohol-induced mood disorder (HCC) 09/08/2016   Homelessness 09/03/2016   Person feigning illness 09/03/2016   Brief psychotic disorder (HCC) 08/29/2016   Cannabis use disorder, mild, abuse 08/29/2016   Pulmonary emboli (HCC) 07/13/2016   Adjustment disorder with emotional disturbance 03/12/2014   Pulmonary embolism and infarction (HCC) 01/02/2014   Acute left flank pain 01/02/2014   Pulmonary embolism, bilateral (HCC) 01/02/2014    No past surgical history on file.     Family History  Problem Relation Age of Onset   Hypertension Mother     Social History   Tobacco Use   Smoking status: Every Day    Packs/day: 0.50    Types: Cigarettes   Smokeless tobacco: Never  Vaping Use   Vaping Use: Never used   Substance Use Topics   Alcohol use: Yes    Comment: BAC not available   Drug use: Yes    Types: Marijuana    Comment: Last used: 2 months ago UDS NA    Home Medications Prior to Admission medications   Medication Sig Start Date End Date Taking? Authorizing Provider  ibuprofen (ADVIL) 600 MG tablet Take 1 tablet (600 mg total) by mouth every 6 (six) hours as needed. 03/15/21   Linwood Dibbles, MD  risperiDONE (RISPERDAL) 1 MG tablet Take 1 tablet (1 mg total) by mouth at bedtime. 08/30/21   Garlon Hatchet, PA-C  triamcinolone cream (KENALOG) 0.1 % Apply 1 application topically 2 (two) times daily. 05/16/21   Palumbo, April, MD  amantadine (SYMMETREL) 100 MG capsule Take 1 capsule (100 mg total) by mouth 2 (two) times daily. Patient not taking: Reported on 11/04/2019 09/01/19 11/17/19  [name], NP    Allergies    Haldol [haloperidol]  Review of Systems   Review of Systems  Eyes:  Negative for visual disturbance.  Psychiatric/Behavioral:  Positive for hallucinations. Negative for behavioral problems and suicidal ideas.    Physical Exam Updated Vital Signs BP 116/70 (BP Location: Left Arm)   Pulse 74   Temp 97.8 F (36.6 C) (Oral)   Resp 18   SpO2 99%   Physical Exam Vitals and nursing note reviewed.  Constitutional:  General: He is not in acute distress.    Appearance: He is well-developed.  HENT:     Head: Normocephalic and atraumatic.  Eyes:     Extraocular Movements: Extraocular movements intact.  Cardiovascular:     Rate and Rhythm: Normal rate and regular rhythm.  Pulmonary:     Effort: Pulmonary effort is normal.     Breath sounds: Normal breath sounds.  Abdominal:     General: There is no distension.  Musculoskeletal:        General: Normal range of motion.     Cervical back: Normal range of motion.  Skin:    General: Skin is warm.     Findings: No rash.  Neurological:     Mental Status: He is alert and oriented to person, place, and time.   Psychiatric:     Comments: Calm and cooperative.  Eating a sandwich without signs of response to internal stimuli.  Denies SI or HI    ED Results / Procedures / Treatments   Labs (all labs ordered are listed, but only abnormal results are displayed) Labs Reviewed - No data to display  EKG None  Radiology No results found.  Procedures Procedures   Medications Ordered in ED Medications  risperiDONE (RISPERDAL) tablet 1 mg (has no administration in time range)    ED Course  I have reviewed the triage vital signs and the nursing notes.  Pertinent labs & imaging results that were available during my care of the patient were reviewed by me and considered in my medical decision making (see chart for details).    MDM Rules/Calculators/A&P                           Patient presenting to the ER for auditory hallucinations which is baseline, and requesting his respite all which she has been unable to take today.  We will give dose of medication.  Encouraged him to take his home medicine as prescribed.  No SI or HI.  He does not appear to be a threat or danger to himself or others, does not need IVC or emergent behavioral health services.  At this time, patient appears safe for discharge.  Return precautions given.  Patient states he understands and agrees to plan  Final Clinical Impression(s) / ED Diagnoses Final diagnoses:  Auditory hallucination  Medication refill    Rx / DC Orders ED Discharge Orders     None        Alveria Apley, PA-C 09/01/21 1602    Terald Sleeper, MD 09/01/21 2107

## 2021-09-02 ENCOUNTER — Emergency Department (HOSPITAL_COMMUNITY)
Admission: EM | Admit: 2021-09-02 | Discharge: 2021-09-02 | Disposition: A | Discharge status: Home / Self Care | Payer: Self-pay | Attending: Emergency Medicine | Admitting: Emergency Medicine

## 2021-09-02 ENCOUNTER — Other Ambulatory Visit: Payer: Self-pay

## 2021-09-02 ENCOUNTER — Encounter (HOSPITAL_COMMUNITY): Payer: Self-pay | Admitting: Emergency Medicine

## 2021-09-02 DIAGNOSIS — Z760 Encounter for issue of repeat prescription: Secondary | ICD-10-CM | POA: Insufficient documentation

## 2021-09-02 DIAGNOSIS — Z8616 Personal history of COVID-19: Secondary | ICD-10-CM | POA: Insufficient documentation

## 2021-09-02 DIAGNOSIS — Z8659 Personal history of other mental and behavioral disorders: Secondary | ICD-10-CM

## 2021-09-02 DIAGNOSIS — Z79899 Other long term (current) drug therapy: Secondary | ICD-10-CM

## 2021-09-02 DIAGNOSIS — F17210 Nicotine dependence, cigarettes, uncomplicated: Secondary | ICD-10-CM | POA: Insufficient documentation

## 2021-09-02 NOTE — ED Provider Notes (Signed)
[facility] EMERGENCY DEPARTMENT Provider Note   CSN: [id number] Arrival date & time: 09/02/21  0550     History Chief Complaint  Patient presents with   Needs Medication    Cole Barrett is a 35 y.o. male.  Patient presents requesting his daily risperdal dose. States he feels fine, denies acute or new physical or mental health symptoms. No depressed. No thoughts of self harm. No hallucinations or delusions. He indicates he has adequate meds, and also states is planning on re-filling meds when he leaves ED.   The history is provided by the patient and medical records.      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 No pertinent surgical history.     Family History  Problem Relation Age of Onset   Hypertension Mother     Social History   Tobacco Use   Smoking status: Every Day    Packs/day: 0.50    Types: Cigarettes   Smokeless tobacco: Never  Vaping Use   Vaping Use: Never used  Substance Use Topics   Alcohol use: Yes    Comment: BAC not available   Drug use: Yes    Types: Marijuana     Comment: Last used: 2 months ago UDS NA    Home Medications Prior to Admission medications   Medication Sig Start Date End Date Taking? Authorizing Provider  ibuprofen (ADVIL) 600 MG tablet Take 1 tablet (600 mg total) by mouth every 6 (six) hours as needed. 03/15/21   Linwood Dibbles, MD  risperiDONE (RISPERDAL) 1 MG tablet Take 1 tablet (1 mg total) by mouth at bedtime. 08/30/21   Garlon Hatchet, PA-C  triamcinolone cream (KENALOG) 0.1 % Apply 1 application topically 2 (two) times daily. 05/16/21   Palumbo, April, MD  amantadine (SYMMETREL) 100 MG capsule Take 1 capsule (100 mg total) by mouth 2 (two) times daily. Patient not taking: Reported on 11/04/2019 09/01/19 11/17/19  [name], NP    Allergies    Haldol [haloperidol]  Review of Systems   Review of Systems  Constitutional:  Negative for fever.  Respiratory:  Negative for shortness of breath.   Cardiovascular:  Negative for chest pain.  Gastrointestinal:  Negative for abdominal pain.  Neurological:  Negative for headaches.  Psychiatric/Behavioral:  Negative for dysphoric mood and suicidal ideas.    Physical Exam Updated Vital Signs BP 138/86 (BP Location: Left Arm)   Pulse 94   Temp (!) 97.1 F (36.2 C) (Oral)   Resp 20   Wt 108 kg   SpO2 97%   BMI 33.21 kg/m   Physical Exam Vitals  and nursing note reviewed.  Constitutional:      Appearance: Normal appearance. He is well-developed.  HENT:     Head: Atraumatic.     Nose: Nose normal.     Mouth/Throat:     Mouth: Mucous membranes are moist.     Pharynx: Oropharynx is clear.  Eyes:     General: No scleral icterus.    Conjunctiva/sclera: Conjunctivae normal.  Neck:     Trachea: No tracheal deviation.  Cardiovascular:     Rate and Rhythm: Normal rate.     Pulses: Normal pulses.  Pulmonary:     Effort: Pulmonary effort is normal. No accessory muscle usage or respiratory distress.  Abdominal:     General: There is no distension.  Genitourinary:    Comments:  No cva tenderness. Musculoskeletal:        General: No swelling.     Cervical back: Neck supple.  Skin:    General: Skin is warm and dry.     Findings: No rash.  Neurological:     Mental Status: He is alert.     Comments: Alert, speech clear. Motor/sens grossly intact bil. Steady gait.   Psychiatric:        Mood and Affect: Mood normal.     Comments: Normal mood and affect. Pt does not appear to be responding to internal stimuli. No hallucinations or delusions.     ED Results / Procedures / Treatments   Labs (all labs ordered are listed, but only abnormal results are displayed) Labs Reviewed - No data to display  EKG None  Radiology No results found.  Procedures Procedures   Medications Ordered in ED Medications - No data to display  ED Course  I have reviewed the triage vital signs and the nursing notes.  Pertinent labs & imaging results that were available during my care of the patient were reviewed by me and considered in my medical decision making (see chart for details).    MDM Rules/Calculators/A&P                          Reviewed nursing notes and prior charts for additional history.  On review of chart, pt with multiple (7) visits to ED and/or [facility] in past 1 week+ requesting a single dose of his risperdal.  When in ED, typically also asks for food.   Pt is eating/drinking now. He is not acutely psychotic or depressed.    He indicates is going to get his meds now.   It appears his risperdal is 1 mg qhs (and it is 830 AM now).   Discussed w pt, not appropriate to use ED setting frequently and/or daily for routine/standard doses of meds. Pt indicates he does not need new prescription.   Pt encouraged to f/u at [facility] for future meds/med refills and to coordinate his [facility] care and follow up.   Pt currently appears stable for d/c.    Final Clinical Impression(s) / ED Diagnoses Final diagnoses:  None    Rx / DC Orders ED Discharge Orders     None         Cathren Laine, MD 09/02/21 [phone]

## 2021-09-02 NOTE — ED Notes (Signed)
Patient given discharge instructions. Questions were answered. Patient verbalized understanding of discharge instructions and care at home. Reinforced education regarding proper emergency department use and utilizing outside resources for medication management.

## 2021-09-02 NOTE — Discharge Instructions (Signed)
It was our pleasure to provide your ER care today.  Please take your medication as prescribed.   It is not appropriate to use an ER to obtain daily, routine medications.  Make sure to plan accordingly so you have consistent access to your daily meds.   Should you need assistance with your meds in future, and for your mental health needs or should you experience a mental health crisis, you may go directly to the Behavioral Health Urgent Care.   Return to ER if worse, new symptoms, fevers, chest pain, trouble breathing, or other emergency concern.

## 2021-09-02 NOTE — ED Triage Notes (Signed)
Pt in w/need for Risperdal, states last dose yesterday.Reports some AH, denies any SI/HI

## 2021-09-04 ENCOUNTER — Encounter (HOSPITAL_COMMUNITY): Payer: Self-pay | Admitting: Emergency Medicine

## 2021-09-04 ENCOUNTER — Emergency Department (HOSPITAL_COMMUNITY)
Admission: EM | Admit: 2021-09-04 | Discharge: 2021-09-04 | Disposition: A | Discharge status: Home / Self Care | Payer: Self-pay | Attending: Emergency Medicine | Admitting: Emergency Medicine

## 2021-09-04 ENCOUNTER — Emergency Department (HOSPITAL_COMMUNITY): Payer: Self-pay

## 2021-09-04 ENCOUNTER — Other Ambulatory Visit: Payer: Self-pay

## 2021-09-04 DIAGNOSIS — F17210 Nicotine dependence, cigarettes, uncomplicated: Secondary | ICD-10-CM | POA: Insufficient documentation

## 2021-09-04 DIAGNOSIS — S5012XA Contusion of left forearm, initial encounter: Secondary | ICD-10-CM | POA: Insufficient documentation

## 2021-09-04 DIAGNOSIS — Z8616 Personal history of COVID-19: Secondary | ICD-10-CM | POA: Insufficient documentation

## 2021-09-04 DIAGNOSIS — W228XXA Striking against or struck by other objects, initial encounter: Secondary | ICD-10-CM | POA: Insufficient documentation

## 2021-09-04 NOTE — ED Triage Notes (Signed)
Patient accidentally hit his left forearm against a door this evening , reports pain at forearm with mild swelling .

## 2021-09-04 NOTE — ED Provider Notes (Signed)
[facility] EMERGENCY DEPARTMENT Provider Note   CSN: [id number] Arrival date & time: 09/04/21  0113     History Chief Complaint  Patient presents with   Forearm Pain     Cole Barrett is a 35 y.o. male.  HPI Patient a 35 year old male with past medical history significant for homelessness and paranoid behavior and schizophrenia  Patient presented to ER today with complaints of smacking his left forearm against a door this evening he states that he has had severe constant achy pain in his mid forearm since that time.  He states his arm is swollen and painful.  Has no other symptoms.  Initially asleep in triage when I evaluated patient.  Moved to triage room for discharge and full evaluation.  He has no other complaints at this time.  Denies any chest pain shortness breath lightheadedness dizziness.  States that he has taken all of his medications.  All questions answered best my ability.    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 No pertinent surgical history.     Family History  Problem Relation  Age of Onset   Hypertension Mother     Social History   Tobacco Use   Smoking status: Every Day    Packs/day: 0.50    Types: Cigarettes   Smokeless tobacco: Never  Vaping Use   Vaping Use: Never used  Substance Use Topics   Alcohol use: Yes    Comment: BAC not available   Drug use: Yes    Types: Marijuana    Comment: Last used: 2 months ago UDS NA    Home Medications Prior to Admission medications   Medication Sig Start Date End Date Taking? Authorizing Provider  ibuprofen (ADVIL) 600 MG tablet Take 1 tablet (600 mg total) by mouth every 6 (six) hours as needed. 03/15/21   Linwood Dibbles, MD  risperiDONE (RISPERDAL) 1 MG tablet Take 1 tablet (1 mg total) by mouth at bedtime. 08/30/21   Garlon Hatchet, PA-C  triamcinolone cream (KENALOG) 0.1 % Apply 1 application topically 2 (two) times daily. 05/16/21   Palumbo, April, MD  amantadine (SYMMETREL) 100 MG capsule Take 1 capsule (100 mg total) by mouth 2 (two) times daily. Patient not taking: Reported on 11/04/2019 09/01/19 11/17/19  [name], NP    Allergies    Haldol [haloperidol]  Review of Systems   Review of Systems  Constitutional:  Negative for fever.  HENT:  Negative for congestion.   Respiratory:  Negative for shortness of breath.   Cardiovascular:  Negative for chest pain.  Gastrointestinal:  Negative for abdominal distention.  Musculoskeletal:        Forearm pain  Neurological:  Negative for dizziness and headaches.   Physical Exam Updated Vital Signs BP 130/85 (BP Location: Left Arm)   Pulse 85   Temp 98.4 F (36.9 C) (Oral)   Resp 18   Ht 5\' 10"  (1.778 m)   Wt 108 kg   SpO2 100%   BMI 34.16 kg/m   Physical Exam Vitals and nursing note reviewed.  Constitutional:      General: He is not in acute distress.    Appearance: Normal appearance. He is not ill-appearing.     Comments: Reserved.  Somewhat bloodshot eyes.  HENT:     Head: Normocephalic and atraumatic.  Eyes:     General: No scleral  icterus.       Right eye: No discharge.        Left eye: No discharge.     Conjunctiva/sclera: Conjunctivae normal.  Pulmonary:     Effort: Pulmonary effort is normal.     Breath sounds: No stridor.  Musculoskeletal:     Comments: Diffuse mild tenderness to palpation of left forearm no focal bony tenderness.  Distally neurovascularly intact with good cap refill sensation and movement of bilateral upper extremities.  Neurological:     Mental Status: He is alert and oriented to person, place, and time. Mental status is at baseline.    ED Results / Procedures / Treatments   Labs (all labs ordered are listed, but only abnormal results are displayed) Labs Reviewed - No data to display  EKG None  Radiology DG Forearm Left  Result Date: 09/04/2021 CLINICAL DATA:  Left forearm injury, pain EXAM: LEFT FOREARM - 2 VIEW COMPARISON:  None. FINDINGS: There is no evidence of fracture or other focal bone lesions. Soft tissues are unremarkable. IMPRESSION: Negative. Electronically Signed   By: 11/04/2021 M.D.   On: 09/04/2021 03:46    Procedures Procedures   Medications Ordered in ED Medications - No data to display  ED Course  I have reviewed the triage vital signs and the nursing notes.  Pertinent labs & imaging results that were available during my care of the patient were reviewed by me and considered in my medical decision making (see chart for details).    MDM Rules/Calculators/A&P                           Patient is 35 year old male homeless history of schizophrenia states he smacked his left arm against a door earlier today and has pain.  His exam is benign he is distally neurovascularly intact has good movements endorses left forearm pain but does not have any evidence of trauma there is no swelling.  I do not see any track marks abscesses or evidence of cellulitis.  X-ray personally reviewed there is no fracture.  We will discharge patient with Tylenol ibuprofen  recommendations follow-up with PCP return precautions given.  Patient understanding of plan.  Discharged at this time.   Final Clinical Impression(s) / ED Diagnoses Final diagnoses:  Contusion of left forearm, initial encounter    Rx / DC Orders ED Discharge Orders     None        30, Gailen Shelter 09/04/21 0416    11/04/21, MD 09/05/21 1727

## 2021-09-04 NOTE — Discharge Instructions (Addendum)
Your x-ray is negative for any fractures.  Please rest ice and elevate your forearm.  Take your medications as prescribed.  Please use Tylenol or ibuprofen for pain.  You may use 600 mg ibuprofen every 6 hours or 1000 mg of Tylenol every 6 hours.  You may choose to alternate between the 2.  This would be most effective.  Not to exceed 4 g of Tylenol within 24 hours.  Not to exceed 3200 mg ibuprofen 24 hours.

## 2021-09-05 ENCOUNTER — Other Ambulatory Visit: Payer: Self-pay

## 2021-09-05 ENCOUNTER — Emergency Department (HOSPITAL_COMMUNITY)
Admission: EM | Admit: 2021-09-05 | Discharge: 2021-09-05 | Disposition: A | Discharge status: Home / Self Care | Payer: Self-pay | Attending: Emergency Medicine | Admitting: Emergency Medicine

## 2021-09-05 DIAGNOSIS — F17210 Nicotine dependence, cigarettes, uncomplicated: Secondary | ICD-10-CM | POA: Insufficient documentation

## 2021-09-05 DIAGNOSIS — Z8616 Personal history of COVID-19: Secondary | ICD-10-CM | POA: Insufficient documentation

## 2021-09-05 DIAGNOSIS — Z760 Encounter for issue of repeat prescription: Secondary | ICD-10-CM | POA: Insufficient documentation

## 2021-09-05 MED ORDER — RISPERIDONE 1 MG PO TABS
1.0000 mg | ORAL_TABLET | Freq: Once | ORAL | Status: AC
  Administered 2021-09-05: 1 mg via ORAL
  Filled 2021-09-05: qty 1

## 2021-09-05 NOTE — ED Provider Notes (Signed)
[facility] EMERGENCY DEPARTMENT Provider Note   CSN: [id number] Arrival date & time: 09/05/21  0124     History Chief Complaint  Patient presents with   Medication Refill    Cole Barrett is a 35 y.o. male.  Patient presents stating that he has run out of his Risperdal.  Patient has had frequent visits to this emergency department with similar complaints.  Patient not suicidal or homicidal.      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     Family History  Problem Relation Age of Onset   Hypertension Mother     Social History   Tobacco Use   Smoking status: Every Day    Packs/day: 0.50    Types: Cigarettes   Smokeless tobacco: Never  Vaping Use   Vaping Use: Never used  Substance Use Topics   Alcohol use: Yes    Comment: BAC not available   Drug use: Yes    Types: Marijuana    Comment: Last used: 2 months ago UDS NA    Home Medications Prior to Admission medications   Medication Sig Start Date End Date Taking? Authorizing Provider  ibuprofen (ADVIL) 600 MG tablet  Take 1 tablet (600 mg total) by mouth every 6 (six) hours as needed. 03/15/21   Linwood Dibbles, MD  risperiDONE (RISPERDAL) 1 MG tablet Take 1 tablet (1 mg total) by mouth at bedtime. 08/30/21   Garlon Hatchet, PA-C  triamcinolone cream (KENALOG) 0.1 % Apply 1 application topically 2 (two) times daily. 05/16/21   Palumbo, April, MD  amantadine (SYMMETREL) 100 MG capsule Take 1 capsule (100 mg total) by mouth 2 (two) times daily. Patient not taking: Reported on 11/04/2019 09/01/19 11/17/19  [name], NP    Allergies    Haldol [haloperidol]  Review of Systems   Review of Systems  Psychiatric/Behavioral:  Negative for suicidal ideas.   All other systems reviewed and are negative.  Physical Exam Updated Vital Signs BP 119/78   Pulse 74   Temp 98.2 F (36.8 C) (Oral)   Resp 16   SpO2 97%   Physical Exam Vitals and nursing note reviewed.  Constitutional:      General: He is not in acute distress.    Appearance: Normal appearance. He is well-developed.  HENT:     Head: Normocephalic and atraumatic.     Right Ear: Hearing normal.     Left Ear: Hearing normal.     Nose: Nose normal.  Eyes:     Conjunctiva/sclera: Conjunctivae normal.     Pupils: Pupils are equal,  round, and reactive to light.  Cardiovascular:     Rate and Rhythm: Regular rhythm.     Heart sounds: S1 normal and S2 normal. No murmur heard.   No friction rub. No gallop.  Pulmonary:     Effort: Pulmonary effort is normal. No respiratory distress.     Breath sounds: Normal breath sounds.  Chest:     Chest wall: No tenderness.  Abdominal:     General: Bowel sounds are normal.     Palpations: Abdomen is soft.     Tenderness: There is no abdominal tenderness. There is no guarding or rebound. Negative signs include Murphy's sign and McBurney's sign.     Hernia: No hernia is present.  Musculoskeletal:        General: Normal range of motion.     Cervical back: Normal range of motion and neck supple.  Skin:     General: Skin is warm and dry.     Findings: No rash.  Neurological:     Mental Status: He is alert and oriented to person, place, and time.     GCS: GCS eye subscore is 4. GCS verbal subscore is 5. GCS motor subscore is 6.     Cranial Nerves: No cranial nerve deficit.     Sensory: No sensory deficit.     Coordination: Coordination normal.  Psychiatric:        Speech: Speech normal.        Behavior: Behavior normal.        Thought Content: Thought content normal.    ED Results / Procedures / Treatments   Labs (all labs ordered are listed, but only abnormal results are displayed) Labs Reviewed - No data to display  EKG None  Radiology DG Forearm Left  Result Date: 09/04/2021 CLINICAL DATA:  Left forearm injury, pain EXAM: LEFT FOREARM - 2 VIEW COMPARISON:  None. FINDINGS: There is no evidence of fracture or other focal bone lesions. Soft tissues are unremarkable. IMPRESSION: Negative. Electronically Signed   By: Helyn Numbers M.D.   On: 09/04/2021 03:46    Procedures Procedures   Medications Ordered in ED Medications  risperiDONE (RISPERDAL) tablet 1 mg (1 mg Oral Given 09/05/21 0400)    ED Course  I have reviewed the triage vital signs and the nursing notes.  Pertinent labs & imaging results that were available during my care of the patient were reviewed by me and considered in my medical decision making (see chart for details).    MDM Rules/Calculators/A&P                           Patient presents once again asking for a dose of Risperdal.  He reports that he is out of his Risperdal.  He has had frequent visits with the same complaint.  He is homeless, does not have the ability to fill prescriptions.  Patient given his dose, will be discharged.  He is not homicidal, suicidal, disorganized or hallucinating at this time.  Final Clinical Impression(s) / ED Diagnoses Final diagnoses:  Medication refill    Rx / DC Orders ED Discharge Orders     None         Jadynn Epping, Canary Brim, MD 09/05/21 [phone]

## 2021-09-05 NOTE — ED Provider Notes (Signed)
Emergency Medicine Provider Triage Evaluation Note  Cole Barrett , Barrett 35 y.o. male  was evaluated in triage.  Pt complains of medication refill.  He is out of his home Risperdal.  Last dose was this morning.  He denies SI, HI, auditory visual hallucinations.  No other complaints including chest pain, shortness of breath, fever, chills, vomiting, abdominal pain.  Review of Systems  Positive: None Negative: Chest pain, shortness of breath, fever, chills, vomiting, abdominal pain  Physical Exam  There were no vitals taken for this visit. Gen:   Awake, no distress   Resp:  Normal effort  MSK:   Moves extremities without difficulty  Other:  Bilateral eyes are injected  Medical Decision Making  Medically screening exam initiated at 2:43 AM.  Appropriate orders placed.  Cole Barrett was informed that the remainder of the evaluation will be completed by another provider, this initial triage assessment does not replace that evaluation, and the importance of remaining in the ED until their evaluation is complete.  Patient will require further evaluation in the emergency department.  No labs indicated at this time.   Cole Pear A, PA-C 09/05/21 0245    Cole Crease, MD 09/05/21 [phone]

## 2021-09-05 NOTE — ED Triage Notes (Signed)
Pt requesting risperdal medication. No other complaints

## 2021-09-06 ENCOUNTER — Encounter (HOSPITAL_COMMUNITY): Payer: Self-pay | Admitting: Emergency Medicine

## 2021-09-06 ENCOUNTER — Emergency Department (HOSPITAL_COMMUNITY)
Admission: EM | Admit: 2021-09-06 | Discharge: 2021-09-06 | Disposition: A | Discharge status: Home / Self Care | Payer: Self-pay | Attending: Emergency Medicine | Admitting: Emergency Medicine

## 2021-09-06 DIAGNOSIS — Z760 Encounter for issue of repeat prescription: Secondary | ICD-10-CM | POA: Insufficient documentation

## 2021-09-06 DIAGNOSIS — Z8616 Personal history of COVID-19: Secondary | ICD-10-CM | POA: Insufficient documentation

## 2021-09-06 DIAGNOSIS — F17210 Nicotine dependence, cigarettes, uncomplicated: Secondary | ICD-10-CM | POA: Insufficient documentation

## 2021-09-06 DIAGNOSIS — Z79899 Other long term (current) drug therapy: Secondary | ICD-10-CM | POA: Insufficient documentation

## 2021-09-06 MED ORDER — RISPERIDONE 1 MG PO TABS
1.0000 mg | ORAL_TABLET | Freq: Once | ORAL | Status: AC
  Administered 2021-09-06: 1 mg via ORAL
  Filled 2021-09-06: qty 1

## 2021-09-06 NOTE — ED Provider Notes (Signed)
[facility]-EMERGENCY DEPT Provider Note   CSN: [id number] Arrival date & time: 09/06/21  0105     History Chief Complaint  Patient presents with   Medication Refill    Cole Barrett is a 35 y.o. male with a history of homelessness, paranoid behavior, schizophrenia who presents to the emergency department with a chief complaint of medication refill.  The patient reports that he has run out of his home Risperdal.  He has frequent visits to the emergency department requesting a dose of his home Risperdal.  He denies SI, HI, or auditory visual hallucinations.  He has no other complaints at this time.  The history is provided by the patient and medical records. No language interpreter was used.      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 No pertinent surgical history.     Family History  Problem Relation Age of Onset   Hypertension Mother     Social History   Tobacco Use   Smoking status: Every Day    Packs/day: 0.50    Types: Cigarettes   Smokeless tobacco: Never  Vaping Use   Vaping Use:  Never used  Substance Use Topics   Alcohol use: Yes    Comment: BAC not available   Drug use: Yes    Types: Marijuana    Comment: Last used: 2 months ago UDS NA    Home Medications Prior to Admission medications   Medication Sig Start Date End Date Taking? Authorizing Provider  ibuprofen (ADVIL) 600 MG tablet Take 1 tablet (600 mg total) by mouth every 6 (six) hours as needed. 03/15/21   Linwood Dibbles, MD  risperiDONE (RISPERDAL) 1 MG tablet Take 1 tablet (1 mg total) by mouth at bedtime. 08/30/21   Garlon Hatchet, PA-C  triamcinolone cream (KENALOG) 0.1 % Apply 1 application topically 2 (two) times daily. 05/16/21   Palumbo, April, MD  amantadine (SYMMETREL) 100 MG capsule Take 1 capsule (100 mg total) by mouth 2 (two) times daily. Patient not taking: Reported on 11/04/2019 09/01/19 11/17/19  [name], NP    Allergies    Haldol [haloperidol]  Review of Systems   Review of Systems  Constitutional:  Negative for activity change and fever.  HENT:  Negative for congestion and sore throat.   Respiratory:  Negative for shortness of breath.   Cardiovascular:  Negative for chest pain.  Gastrointestinal:  Negative for abdominal pain, constipation, diarrhea, nausea and vomiting.  Musculoskeletal:  Negative for back pain.  Skin:  Negative for rash.  Neurological:  Negative for dizziness and syncope.  Psychiatric/Behavioral:  Negative for confusion,  decreased concentration, hallucinations, self-injury and suicidal ideas. The patient is not hyperactive.    Physical Exam Updated Vital Signs BP 118/74 (BP Location: Right Arm)   Pulse 79   Temp 98 F (36.7 C) (Oral)   Resp 16   Ht 5\' 10"  (1.778 m)   Wt 108.9 kg   SpO2 96%   BMI 34.44 kg/m   Physical Exam Vitals and nursing note reviewed.  Constitutional:      Appearance: He is well-developed.     Comments: Sleeping, but arouses easily to voice.  HENT:     Head: Normocephalic.  Eyes:     Comments: Bilateral eyes are injected.   Cardiovascular:     Rate and Rhythm: Normal rate and regular rhythm.     Heart sounds: No murmur heard. Pulmonary:     Effort: Pulmonary effort is normal.  Abdominal:     General: There is no distension.     Palpations: Abdomen is soft.  Musculoskeletal:     Cervical back: Neck supple.  Skin:    General: Skin is warm and dry.  Psychiatric:        Attention and Perception: He does not perceive auditory or visual hallucinations.        Speech: Speech normal. Speech is not slurred.        Behavior: Behavior normal.        Thought Content: Thought content does not include homicidal or suicidal ideation. Thought content does not include homicidal or suicidal plan.    ED Results / Procedures / Treatments   Labs (all labs ordered are listed, but only abnormal results are displayed) Labs Reviewed - No data to display  EKG None  Radiology DG Forearm Left  Result Date: 09/04/2021 CLINICAL DATA:  Left forearm injury, pain EXAM: LEFT FOREARM - 2 VIEW COMPARISON:  None. FINDINGS: There is no evidence of fracture or other focal bone lesions. Soft tissues are unremarkable. IMPRESSION: Negative. Electronically Signed   By: 11/04/2021 M.D.   On: 09/04/2021 03:46    Procedures Procedures   Medications Ordered in ED Medications  risperiDONE (RISPERDAL) tablet 1 mg (1 mg Oral Given 09/06/21 0224)    ED Course  I have reviewed the triage vital signs and the nursing notes.  Pertinent labs & imaging results that were available during my care of the patient were reviewed by me and considered in my medical decision making (see chart for details).    MDM Rules/Calculators/A&P                           35 year old male with history of schizophrenia who is well-known to this emergency department with 27 visits in the last 6 months.  He has a history of homelessness.  He typically presents requesting a dose of his home Risperdal as he is unable to afford the prescription.  He has no SI,  HI, or auditory visual hallucinations today.  His been given a dose of his home Risperdal.  He has no other complaints at this time.  ER return precautions given.  He is hemodynamically stable in no acute distress.  Safer discharge home with outpatient follow-up as discussed.  Final Clinical Impression(s) / ED Diagnoses Final diagnoses:  Medication refill    Rx / DC Orders ED Discharge Orders     None        30, PA-C 09/06/21 0252    [place], 11/06/21, MD 09/06/21 11/06/21

## 2021-09-06 NOTE — Discharge Instructions (Signed)
Thank you for allowing me to care for you today in the Emergency Department.   You can follow-up at the [facility] for assistance with your medications.  Return for new or worsening symptoms.

## 2021-09-06 NOTE — ED Triage Notes (Signed)
Pt requesting Risperdal medication. No other complaints at this time.

## 2021-09-07 ENCOUNTER — Encounter (HOSPITAL_COMMUNITY): Payer: Self-pay | Admitting: Emergency Medicine

## 2021-09-07 ENCOUNTER — Emergency Department (HOSPITAL_COMMUNITY)
Admission: EM | Admit: 2021-09-07 | Discharge: 2021-09-07 | Disposition: A | Discharge status: Home / Self Care | Payer: Self-pay | Attending: Emergency Medicine | Admitting: Emergency Medicine

## 2021-09-07 ENCOUNTER — Other Ambulatory Visit: Payer: Self-pay

## 2021-09-07 DIAGNOSIS — Z8616 Personal history of COVID-19: Secondary | ICD-10-CM | POA: Insufficient documentation

## 2021-09-07 DIAGNOSIS — Z8659 Personal history of other mental and behavioral disorders: Secondary | ICD-10-CM

## 2021-09-07 DIAGNOSIS — M79601 Pain in right arm: Secondary | ICD-10-CM | POA: Insufficient documentation

## 2021-09-07 DIAGNOSIS — F209 Schizophrenia, unspecified: Secondary | ICD-10-CM | POA: Insufficient documentation

## 2021-09-07 DIAGNOSIS — W228XXA Striking against or struck by other objects, initial encounter: Secondary | ICD-10-CM | POA: Insufficient documentation

## 2021-09-07 DIAGNOSIS — F17210 Nicotine dependence, cigarettes, uncomplicated: Secondary | ICD-10-CM | POA: Insufficient documentation

## 2021-09-07 MED ORDER — RISPERIDONE 1 MG PO TABS
1.0000 mg | ORAL_TABLET | Freq: Once | ORAL | Status: AC
  Administered 2021-09-07: 1 mg via ORAL
  Filled 2021-09-07 (×2): qty 1

## 2021-09-07 NOTE — ED Triage Notes (Signed)
Patient reports chronic/persistent  auditory hallucinations for several years.

## 2021-09-07 NOTE — ED Provider Notes (Signed)
[facility] EMERGENCY DEPARTMENT Provider Note   CSN: [id number] Arrival date & time: 09/07/21  0033     History Chief Complaint  Patient presents with   Auditory Hallucinations    Cole Barrett is a 35 y.o. male.  Patient well known to the ED with h/o homelessness, schizophrenia presents to ED stating he is out of his medication and needs a dose because he is hearing voices. No SI/HI.   The history is provided by the patient. No language interpreter was used.      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 No pertinent surgical history.     Family History  Problem Relation Age of Onset   Hypertension Mother     Social History   Tobacco Use   Smoking status: Every Day    Packs/day: 0.50    Types: Cigarettes   Smokeless tobacco: Never  Vaping Use   Vaping Use: Never used  Substance Use Topics   Alcohol use: Yes    Comment: BAC not available   Drug use: Yes    Types: Marijuana    Comment: Last used: 2 months ago UDS NA    Home Medications Prior to Admission medications   Medication  Sig Start Date End Date Taking? Authorizing Provider  ibuprofen (ADVIL) 600 MG tablet Take 1 tablet (600 mg total) by mouth every 6 (six) hours as needed. 03/15/21   Linwood Dibbles, MD  risperiDONE (RISPERDAL) 1 MG tablet Take 1 tablet (1 mg total) by mouth at bedtime. 08/30/21   Garlon Hatchet, PA-C  triamcinolone cream (KENALOG) 0.1 % Apply 1 application topically 2 (two) times daily. 05/16/21   Palumbo, April, MD  amantadine (SYMMETREL) 100 MG capsule Take 1 capsule (100 mg total) by mouth 2 (two) times daily. Patient not taking: Reported on 11/04/2019 09/01/19 11/17/19  [name], NP    Allergies    Haldol [haloperidol]  Review of Systems   Review of Systems  Constitutional:  Negative for chills and fever.  HENT: Negative.    Respiratory: Negative.    Cardiovascular: Negative.   Gastrointestinal: Negative.   Musculoskeletal: Negative.   Skin: Negative.   Neurological: Negative.   Psychiatric/Behavioral:  Positive for hallucinations. Negative for suicidal ideas.    Physical Exam Updated Vital Signs There were no vitals taken for this visit.  Physical Exam Vitals and nursing note reviewed.  Constitutional:      Appearance: He is well-developed.  Pulmonary:     Effort: Pulmonary effort is normal.  Musculoskeletal:        General: Normal range of motion.  Cervical back: Normal range of motion.  Skin:    General: Skin is warm and dry.  Neurological:     Mental Status: He is alert and oriented to person, place, and time.    ED Results / Procedures / Treatments   Labs (all labs ordered are listed, but only abnormal results are displayed) Labs Reviewed - No data to display  EKG None  Radiology No results found.  Procedures Procedures   Medications Ordered in ED Medications - No data to display  ED Course  I have reviewed the triage vital signs and the nursing notes.  Pertinent labs & imaging results that were available during my care of the patient were  reviewed by me and considered in my medical decision making (see chart for details).    MDM Rules/Calculators/A&P                           Patient to ED requesting a dose of his medication (Risperdal) because he is out and hearing voices. No SI/HI.  1 mg dose Risperdal provided. Patient is encouraged to see his doctor for further ongoing management.   Final Clinical Impression(s) / ED Diagnoses Final diagnoses:  None   Schizophrenia   Rx / DC Orders ED Discharge Orders     None        Elpidio Anis, PA-C 09/07/21 [id number]    Shon Baton, MD 09/07/21 [phone]

## 2021-09-07 NOTE — Discharge Instructions (Addendum)
Please follow up with your doctor for further medication management.

## 2021-09-07 NOTE — ED Notes (Signed)
Patient refused vital signs at triage .

## 2021-09-08 ENCOUNTER — Encounter (HOSPITAL_COMMUNITY): Payer: Self-pay | Admitting: Emergency Medicine

## 2021-09-08 ENCOUNTER — Emergency Department (HOSPITAL_COMMUNITY)
Admission: EM | Admit: 2021-09-08 | Discharge: 2021-09-08 | Disposition: A | Discharge status: Home / Self Care | Payer: Self-pay | Attending: Emergency Medicine | Admitting: Emergency Medicine

## 2021-09-08 ENCOUNTER — Other Ambulatory Visit: Payer: Self-pay

## 2021-09-08 DIAGNOSIS — M79601 Pain in right arm: Secondary | ICD-10-CM

## 2021-09-08 MED ORDER — ACETAMINOPHEN 325 MG PO TABS
650.0000 mg | ORAL_TABLET | Freq: Once | ORAL | Status: AC
  Administered 2021-09-08: 650 mg via ORAL
  Filled 2021-09-08: qty 2

## 2021-09-08 NOTE — ED Triage Notes (Signed)
Patient reports left arm pain onset last week , denies injury.

## 2021-09-08 NOTE — ED Provider Notes (Signed)
[facility] EMERGENCY DEPARTMENT Provider Note   CSN: [id number] Arrival date & time: 09/07/21  2238     History Chief Complaint  Patient presents with   Arm Pain     Cole Barrett is a 35 y.o. male who presents the emergency department for chief complaint of left arm pain.  The patient reports that he injured his left forearm several days ago after he hit it against a door.  He reports constant aching pain in the mid forearm.  He thinks that he would benefit from some pain medication.  He has had no recent falls or injuries.  No numbness, weakness, redness, swelling, fever, chills, wounds.  No left wrist or elbow pain.  No treatment prior to arrival.  He has a history of frequent ER visits.   The history is provided by the patient and medical records. No language interpreter was used.      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 No pertinent surgical history.     Family History  Problem Relation Age of Onset   Hypertension Mother     Social History   Tobacco Use   Smoking status: Every Day     Packs/day: 0.50    Types: Cigarettes   Smokeless tobacco: Never  Vaping Use   Vaping Use: Never used  Substance Use Topics   Alcohol use: Yes    Comment: BAC not available   Drug use: Yes    Types: Marijuana    Comment: Last used: 2 months ago UDS NA    Home Medications Prior to Admission medications   Medication Sig Start Date End Date Taking? Authorizing Provider  ibuprofen (ADVIL) 600 MG tablet Take 1 tablet (600 mg total) by mouth every 6 (six) hours as needed. 03/15/21   Linwood Dibbles, MD  risperiDONE (RISPERDAL) 1 MG tablet Take 1 tablet (1 mg total) by mouth at bedtime. 08/30/21   Garlon Hatchet, PA-C  triamcinolone cream (KENALOG) 0.1 % Apply 1 application topically 2 (two) times daily. 05/16/21   Palumbo, April, MD  amantadine (SYMMETREL) 100 MG capsule Take 1 capsule (100 mg total) by mouth 2 (two) times daily. Patient not taking: Reported on 11/04/2019 09/01/19 11/17/19  [name], NP    Allergies    Haldol [haloperidol]  Review of Systems   Review of Systems  Constitutional:  Negative for appetite change and fever.  Respiratory:  Negative for shortness of breath.   Cardiovascular:  Negative for chest pain.  Gastrointestinal:  Negative for abdominal pain, constipation, diarrhea, nausea and vomiting.  Genitourinary:  Negative for dysuria.  Musculoskeletal:  Positive for myalgias. Negative for arthralgias and back pain.  Skin:  Negative for rash.  Allergic/Immunologic: Negative for immunocompromised state.  Neurological:  Negative for dizziness, seizures, syncope, weakness, numbness and headaches.  Psychiatric/Behavioral:  Negative for confusion.    Physical Exam Updated Vital Signs BP 123/72 (BP Location: Right Arm)   Pulse 64   Temp 98.5 F (36.9 C)   Resp 18   SpO2 97%   Physical Exam Vitals and nursing note reviewed.  Constitutional:      Appearance: He is well-developed.     Comments: Disheveled Falling asleep on exam  HENT:     Head: Normocephalic.   Eyes:     Conjunctiva/sclera: Conjunctivae normal.  Cardiovascular:     Rate and Rhythm: Normal rate and regular rhythm.     Heart sounds: No murmur heard. Pulmonary:     Effort: Pulmonary effort is normal.  Abdominal:     General: There is no distension.     Palpations: Abdomen is soft.  Musculoskeletal:     Cervical back: Neck supple.     Comments: No focal tenderness to palpation to the bilateral upper extremities.  Radial pulses are 2+ and symmetric.  Full active and passive range of motion of the joints of the bilateral upper extremities.  Sensations intact and equal.  Good strength against resistance.  No erythema, edema, warmth, wounds, red streaking.  Muscular compartments are soft.   Skin:    General: Skin is warm and dry.  Psychiatric:        Behavior: Behavior normal.    ED Results / Procedures / Treatments   Labs (all labs ordered are listed, but only abnormal results are displayed) Labs Reviewed - No data to display  EKG None  Radiology No results found.  Procedures Procedures   Medications Ordered in ED Medications  acetaminophen (TYLENOL) tablet 650 mg (650 mg Oral Given 09/08/21 0248)    ED Course  I have reviewed the triage vital signs and the nursing notes.  Pertinent labs & imaging results that were available during my care of the patient were reviewed by me and considered in my medical decision making (see chart for details).    MDM Rules/Calculators/A&P                           35 year old male with history of schizophrenia and frequent ER use, last ER visit, yesterday.  He is presenting for recheck of left arm pain after he hit his forearm against a door several days ago.  He was seen in the ER after that incident and had negative x-rays.  He has no other complaints at this time and has had no new trauma.  Vital signs are stable.  His physical exam is reassuring.  At this time, no further urgent or emergent work-up is indicated.  He was given  Tylenol.  Doubt occult fracture, gout, compartment syndrome, cellulitis.  He can follow-up with primary care for reevaluation.  ER return precautions given.  Final Clinical Impression(s) / ED Diagnoses Final diagnoses:  Right arm pain    Rx / DC Orders ED Discharge Orders     None        Barkley Boards, PA-C 09/08/21 0251    Gilda Crease, MD 09/08/21 [phone]

## 2021-09-08 NOTE — Discharge Instructions (Signed)
Thank you for allowing me to care for you today in the Emergency Department.   You can take 650 mg of Tylenol Motrin once every 6 hours for pain. Apply an ice pack for 15 to 20 minutes as needed.  Return if your fingers turn blue, if you have any fall or injury, new numbness or weakness, or other concerns.

## 2021-09-09 ENCOUNTER — Other Ambulatory Visit: Payer: Self-pay

## 2021-09-09 ENCOUNTER — Encounter (HOSPITAL_COMMUNITY): Payer: Self-pay | Admitting: Emergency Medicine

## 2021-09-09 ENCOUNTER — Emergency Department (HOSPITAL_COMMUNITY)
Admission: EM | Admit: 2021-09-09 | Discharge: 2021-09-09 | Disposition: A | Discharge status: Home / Self Care | Payer: Self-pay | Attending: Emergency Medicine | Admitting: Emergency Medicine

## 2021-09-09 DIAGNOSIS — Z760 Encounter for issue of repeat prescription: Secondary | ICD-10-CM | POA: Insufficient documentation

## 2021-09-09 DIAGNOSIS — Z8616 Personal history of COVID-19: Secondary | ICD-10-CM | POA: Insufficient documentation

## 2021-09-09 DIAGNOSIS — F17210 Nicotine dependence, cigarettes, uncomplicated: Secondary | ICD-10-CM | POA: Insufficient documentation

## 2021-09-09 MED ORDER — RISPERIDONE 1 MG PO TABS
1.0000 mg | ORAL_TABLET | Freq: Once | ORAL | Status: AC
  Administered 2021-09-09: 1 mg via ORAL
  Filled 2021-09-09 (×2): qty 1

## 2021-09-09 NOTE — Discharge Instructions (Signed)
Continue your risperdal as directed.

## 2021-09-09 NOTE — ED Notes (Signed)
Security convinced pt to come out of bathroom. He is eating in waiting room and refused to come into Triage.

## 2021-09-09 NOTE — ED Notes (Signed)
Pt called to triage, is in the bathroom.

## 2021-09-09 NOTE — ED Triage Notes (Signed)
"  I just don't have my medicine."    RN asked why pt's hair was wet... "I took a bath in the bathroom."

## 2021-09-09 NOTE — ED Provider Notes (Signed)
[facility] EMERGENCY DEPARTMENT Provider Note   CSN: [id number] Arrival date & time: 09/09/21  0019     History Chief Complaint  Patient presents with   Medication Refill     Cole Barrett is a 35 y.o. male.  The history is provided by the patient and medical records.   35 year old male with history of homelessness, schizophrenia, presenting to the ED requesting dose of his Risperdal.  He states "I know I need to do better, just give me my dose for tonight".  He does have refill of his medications at the pharmacy.  States otherwise he is doing well without complaints.  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 No pertinent surgical history.     Family History  Problem Relation Age of Onset   Hypertension Mother     Social History   Tobacco Use   Smoking status: Every Day    Packs/day: 0.50    Types: Cigarettes   Smokeless tobacco: Never  Vaping Use   Vaping Use: Never used  Substance Use Topics   Alcohol use: Yes    Comment: BAC not available   Drug use: Yes    Types: Marijuana     Comment: Last used: 2 months ago UDS NA    Home Medications Prior to Admission medications   Medication Sig Start Date End Date Taking? Authorizing Provider  ibuprofen (ADVIL) 600 MG tablet Take 1 tablet (600 mg total) by mouth every 6 (six) hours as needed. 03/15/21   Linwood Dibbles, MD  risperiDONE (RISPERDAL) 1 MG tablet Take 1 tablet (1 mg total) by mouth at bedtime. 08/30/21   Garlon Hatchet, PA-C  triamcinolone cream (KENALOG) 0.1 % Apply 1 application topically 2 (two) times daily. 05/16/21   Palumbo, April, MD  amantadine (SYMMETREL) 100 MG capsule Take 1 capsule (100 mg total) by mouth 2 (two) times daily. Patient not taking: Reported on 11/04/2019 09/01/19 11/17/19  [name], NP    Allergies    Haldol [haloperidol]  Review of Systems   Review of Systems  Psychiatric/Behavioral:         Needs medication  All other systems reviewed and are negative.  Physical Exam Updated Vital Signs BP 125/77 (BP Location: Left Arm)   Pulse 84   Temp 98.9 F (37.2 C) (Oral)   Resp 18   SpO2 96%   Physical Exam Vitals and nursing note reviewed.  Constitutional:      Appearance: He is well-developed.  HENT:     Head: Normocephalic and atraumatic.  Eyes:     Conjunctiva/sclera: Conjunctivae normal.  Pupils: Pupils are equal, round, and reactive to light.  Cardiovascular:     Rate and Rhythm: Normal rate and regular rhythm.     Heart sounds: Normal heart sounds.  Pulmonary:     Effort: Pulmonary effort is normal.     Breath sounds: Normal breath sounds.  Abdominal:     General: Bowel sounds are normal.     Palpations: Abdomen is soft.  Musculoskeletal:        General: Normal range of motion.     Cervical back: Normal range of motion.  Skin:    General: Skin is warm and dry.  Neurological:     Mental Status: He is alert and oriented to person, place, and time.  Psychiatric:     Comments: Denies SI/HI    ED Results / Procedures / Treatments   Labs (all labs ordered  are listed, but only abnormal results are displayed) Labs Reviewed - No data to display  EKG None  Radiology No results found.  Procedures Procedures   Medications Ordered in ED Medications  risperiDONE (RISPERDAL) tablet 1 mg (1 mg Oral Given 09/09/21 0123)    ED Course  I have reviewed the triage vital signs and the nursing notes.  Pertinent labs & imaging results that were available during my care of the patient were reviewed by me and considered in my medical decision making (see chart for details).    MDM Rules/Calculators/A&P                           35 year old male presenting to the ED requesting nighttime dose of his Risperdal.  He is well-known to the ED for similar complaints.  He does have refill waiting at the pharmacy.  Denies SI/HI.  Given dose of Resporal tonight.  Encouraged follow-up with his psychiatry provider.  Can return here for new concerns.  Final Clinical Impression(s) / ED Diagnoses Final diagnoses:  Medication refill    Rx / DC Orders ED Discharge Orders     None        Garlon Hatchet, PA-C 09/09/21 0130    Horton, Clabe Seal, DO 09/10/21 [phone]

## 2021-09-09 NOTE — ED Notes (Signed)
Checked on pt in bathroom again, pt stated "I'm going to wash my hands in a minute."

## 2021-09-09 NOTE — ED Notes (Signed)
Checked on pt again in the bathroom, "I was flushing the commode and putting my coat on."

## 2021-09-11 ENCOUNTER — Encounter (HOSPITAL_COMMUNITY): Payer: Self-pay | Admitting: *Deleted

## 2021-09-11 ENCOUNTER — Emergency Department (HOSPITAL_COMMUNITY)
Admission: EM | Admit: 2021-09-11 | Discharge: 2021-09-11 | Disposition: A | Discharge status: Home / Self Care | Payer: Self-pay | Attending: Emergency Medicine | Admitting: Emergency Medicine

## 2021-09-11 ENCOUNTER — Other Ambulatory Visit: Payer: Self-pay

## 2021-09-11 DIAGNOSIS — M79603 Pain in arm, unspecified: Secondary | ICD-10-CM | POA: Insufficient documentation

## 2021-09-11 DIAGNOSIS — Z5321 Procedure and treatment not carried out due to patient leaving prior to being seen by health care provider: Secondary | ICD-10-CM | POA: Insufficient documentation

## 2021-09-11 NOTE — ED Notes (Signed)
Called pt x3 for room, no response. 

## 2021-09-11 NOTE — ED Triage Notes (Signed)
The pt reports that  he wants something for pain   he has had pain for 1-2 hours  he frequrnts this ed

## 2021-09-12 DIAGNOSIS — Z5321 Procedure and treatment not carried out due to patient leaving prior to being seen by health care provider: Secondary | ICD-10-CM | POA: Insufficient documentation

## 2021-09-12 DIAGNOSIS — Z7189 Other specified counseling: Secondary | ICD-10-CM | POA: Insufficient documentation

## 2021-09-13 ENCOUNTER — Emergency Department (HOSPITAL_COMMUNITY)
Admission: EM | Admit: 2021-09-13 | Discharge: 2021-09-13 | Disposition: A | Discharge status: Home / Self Care | Payer: Self-pay | Attending: Emergency Medicine | Admitting: Emergency Medicine

## 2021-09-13 ENCOUNTER — Encounter (HOSPITAL_COMMUNITY): Payer: Self-pay | Admitting: *Deleted

## 2021-09-13 DIAGNOSIS — Z5321 Procedure and treatment not carried out due to patient leaving prior to being seen by health care provider: Secondary | ICD-10-CM | POA: Insufficient documentation

## 2021-09-13 DIAGNOSIS — M79602 Pain in left arm: Secondary | ICD-10-CM | POA: Insufficient documentation

## 2021-09-13 NOTE — ED Triage Notes (Signed)
Pt is sleeping in the triage chair.  Arouses without difficulty.  When asked why he is here he states, "nothing, I just want to rest".

## 2021-09-13 NOTE — ED Notes (Signed)
Pt stated that he has somewhere to be and has left the ED.

## 2021-09-14 ENCOUNTER — Emergency Department (HOSPITAL_COMMUNITY)
Admission: EM | Admit: 2021-09-14 | Discharge: 2021-09-14 | Disposition: A | Discharge status: Home / Self Care | Payer: Self-pay | Attending: Emergency Medicine | Admitting: Emergency Medicine

## 2021-09-14 NOTE — ED Notes (Signed)
Patient stated that he had to go to work patient decided to leave

## 2021-09-14 NOTE — ED Triage Notes (Signed)
Pt c/o left arm pain however he wants to only have his blood pressure taken on his left arm.  Pt is sleeping, no signs of distress noted.

## 2021-09-15 ENCOUNTER — Emergency Department (HOSPITAL_COMMUNITY)
Admission: EM | Admit: 2021-09-15 | Discharge: 2021-09-15 | Disposition: A | Discharge status: Home / Self Care | Payer: Self-pay | Attending: Emergency Medicine | Admitting: Emergency Medicine

## 2021-09-15 ENCOUNTER — Encounter (HOSPITAL_COMMUNITY): Payer: Self-pay

## 2021-09-15 ENCOUNTER — Emergency Department (HOSPITAL_COMMUNITY)
Admission: EM | Admit: 2021-09-15 | Discharge: 2021-09-16 | Disposition: A | Discharge status: Home / Self Care | Payer: Self-pay | Attending: Emergency Medicine | Admitting: Emergency Medicine

## 2021-09-15 ENCOUNTER — Other Ambulatory Visit: Payer: Self-pay

## 2021-09-15 DIAGNOSIS — M79602 Pain in left arm: Secondary | ICD-10-CM | POA: Insufficient documentation

## 2021-09-15 DIAGNOSIS — F17210 Nicotine dependence, cigarettes, uncomplicated: Secondary | ICD-10-CM | POA: Insufficient documentation

## 2021-09-15 DIAGNOSIS — Z8616 Personal history of COVID-19: Secondary | ICD-10-CM | POA: Insufficient documentation

## 2021-09-15 DIAGNOSIS — R440 Auditory hallucinations: Secondary | ICD-10-CM | POA: Insufficient documentation

## 2021-09-15 MED ORDER — RISPERIDONE 1 MG PO TABS
1.0000 mg | ORAL_TABLET | Freq: Once | ORAL | Status: AC
  Administered 2021-09-15: 1 mg via ORAL
  Filled 2021-09-15: qty 1

## 2021-09-15 NOTE — ED Notes (Signed)
The voices are saying they are going to kill him, denies any SI/HI.

## 2021-09-15 NOTE — ED Provider Notes (Signed)
[name] EMERGENCY DEPARTMENT Provider Note   CSN: [id number] Arrival date & time: 09/15/21  0008     History No chief complaint on file.   Cole Barrett is a 35 y.o. male.  The history is provided by the patient and medical records. No language interpreter was used.   35 year old male significant history of schizophrenia, homelessness, who presents with with complaints of hearing voices.  Patient is requesting to have a dose of his Risperdal.  States that when he does not have his medication is having trouble with hallucination hearing voices.  Report last use of his medication was 2 nights ago.  Patient admits that he still has enough medication but would like a dose of Risperdal during this visit.  He refused to go into more details but denies SI and HI.  Patient has been seen several times in the ED for similar presentation.  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 No pertinent surgical history.     Family History  Problem Relation Age of Onset   Hypertension Mother     Social History    Tobacco Use   Smoking status: Every Day    Packs/day: 0.50    Types: Cigarettes   Smokeless tobacco: Never  Vaping Use   Vaping Use: Never used  Substance Use Topics   Alcohol use: Yes    Comment: BAC not available   Drug use: Yes    Types: Marijuana    Comment: Last used: 2 months ago UDS NA    Home Medications Prior to Admission medications   Medication Sig Start Date End Date Taking? Authorizing Provider  ibuprofen (ADVIL) 600 MG tablet Take 1 tablet (600 mg total) by mouth every 6 (six) hours as needed. 03/15/21   Linwood Dibbles, MD  risperiDONE (RISPERDAL) 1 MG tablet Take 1 tablet (1 mg total) by mouth at bedtime. 08/30/21   Garlon Hatchet, PA-C  triamcinolone cream (KENALOG) 0.1 % Apply 1 application topically 2 (two) times daily. 05/16/21   Palumbo, April, MD  amantadine (SYMMETREL) 100 MG capsule Take 1 capsule (100 mg total) by mouth 2 (two) times daily. Patient not taking: Reported on 11/04/2019 09/01/19 11/17/19  [name], NP    Allergies    Haldol [haloperidol]  Review of Systems   Review of Systems  Unable to perform ROS: Psychiatric disorder   Physical Exam Updated Vital Signs BP 114/69 (BP Location: Right Arm)   Pulse 72   Temp 97.9 F (36.6 C) (Oral)   Resp 16   Ht 5\' 10"  (1.778 m)   Wt 103 kg  SpO2 99%   BMI 32.57 kg/m   Physical Exam Vitals and nursing note reviewed.  Constitutional:      General: He is not in acute distress.    Appearance: He is well-developed.     Comments: Appears to be in no acute discomfort, resting comfortably  HENT:     Head: Atraumatic.  Eyes:     Conjunctiva/sclera: Conjunctivae normal.  Cardiovascular:     Rate and Rhythm: Normal rate and regular rhythm.     Pulses: Normal pulses.     Heart sounds: Normal heart sounds.  Pulmonary:     Effort: Pulmonary effort is normal.     Breath sounds: Normal breath sounds.  Musculoskeletal:     Cervical back: Neck supple.  Skin:    Findings: No rash.  Neurological:      Mental Status: He is alert.     GCS: GCS eye subscore is 4. GCS verbal subscore is 5. GCS motor subscore is 6.  Psychiatric:        Attention and Perception: He perceives auditory hallucinations.        Speech: Speech normal.        Behavior: Behavior is withdrawn.        Thought Content: Thought content does not include homicidal or suicidal ideation.    ED Results / Procedures / Treatments   Labs (all labs ordered are listed, but only abnormal results are displayed) Labs Reviewed - No data to display  EKG None  Radiology No results found.  Procedures Procedures   Medications Ordered in ED Medications - No data to display  ED Course  I have reviewed the triage vital signs and the nursing notes.  Pertinent labs & imaging results that were available during my care of the patient were reviewed by me and considered in my medical decision making (see chart for details).    MDM Rules/Calculators/A&P                           BP 114/69 (BP Location: Right Arm)   Pulse 72   Temp 97.9 F (36.6 C) (Oral)   Resp 16   Ht 5\' 10"  (1.778 m)   Wt 103 kg   SpO2 99%   BMI 32.57 kg/m   Final Clinical Impression(s) / ED Diagnoses Final diagnoses:  Auditory hallucination    Rx / DC Orders ED Discharge Orders     None      8:27 AM Patient with history of schizophrenia here requesting for a dose of Risperdal due to having increased auditory hallucination.  Last dose of Risperdal according to patient was 2 days ago.  He does not want to go into any more detail aside from requesting for single dose of Risperdal.  No SI or HI.  I do believe it is appropriate to provide him with his home dose and will provide resources for outpatient follow-up.   , PA-C 09/15/21 0847    09/17/21, MD 09/15/21 [phone]

## 2021-09-15 NOTE — Discharge Instructions (Signed)
Please make sure to take your Risperdal regularly as prescribed.  Follow-up with your doctor for further management.

## 2021-09-15 NOTE — ED Triage Notes (Signed)
Pt here stating that he's hearing voices. Pt stated that he's not been taking his medications.

## 2021-09-16 ENCOUNTER — Encounter (HOSPITAL_COMMUNITY): Payer: Self-pay | Admitting: Emergency Medicine

## 2021-09-16 ENCOUNTER — Other Ambulatory Visit: Payer: Self-pay

## 2021-09-16 MED ORDER — ACETAMINOPHEN 325 MG PO TABS
650.0000 mg | ORAL_TABLET | Freq: Once | ORAL | Status: AC
  Administered 2021-09-16: 650 mg via ORAL

## 2021-09-16 NOTE — ED Triage Notes (Signed)
Pt c/o arm pain. Requesting pain medication.

## 2021-09-16 NOTE — ED Provider Notes (Signed)
[facility] EMERGENCY DEPARTMENT Provider Note   CSN: [id number] Arrival date & time: 09/15/21  2355     History Chief Complaint  Patient presents with   Arm Pain    Cole Barrett is a 35 y.o. male.  Patient to ED with complaint of left arm pain. This is a recurrent issue for him. No new injury.  The history is provided by the patient. No language interpreter was used.  Arm Pain      Past Medical History:  Diagnosis Date   COVID-19    Homelessness    Hypercholesterolemia    Mental disorder    Paranoid behavior (HCC)    PE (pulmonary embolism)    Schizophrenia (HCC)     Patient Active Problem List   Diagnosis Date Noted   Acute psychosis (HCC) 08/31/2019   Schizophrenia (HCC) 08/31/2019   Schizoaffective disorder, bipolar type (HCC) 01/19/2019   Alcohol abuse    Auditory hallucinations    Alcohol abuse with alcohol-induced mood disorder (HCC) 09/08/2016   Homelessness 09/03/2016   Person feigning illness 09/03/2016   Brief psychotic disorder (HCC) 08/29/2016   Cannabis use disorder, mild, abuse 08/29/2016   Pulmonary emboli (HCC) 07/13/2016   Adjustment disorder with emotional disturbance 03/12/2014   Pulmonary embolism and infarction (HCC) 01/02/2014   Acute left flank pain 01/02/2014   Pulmonary embolism, bilateral (HCC) 01/02/2014    History reviewed. No pertinent surgical history.     Family History  Problem Relation Age of Onset   Hypertension Mother     Social History   Tobacco Use   Smoking status: Every Day    Packs/day: 0.50    Types: Cigarettes   Smokeless tobacco: Never  Vaping Use   Vaping Use: Never used  Substance Use Topics   Alcohol use: Yes    Comment: BAC not available   Drug use: Yes    Types: Marijuana    Comment: Last used: 2 months ago UDS NA    Home Medications Prior to Admission medications   Medication Sig Start Date End Date Taking? Authorizing Provider  ibuprofen (ADVIL) 600 MG  tablet Take 1 tablet (600 mg total) by mouth every 6 (six) hours as needed. 03/15/21   Linwood Dibbles, MD  risperiDONE (RISPERDAL) 1 MG tablet Take 1 tablet (1 mg total) by mouth at bedtime. 08/30/21   Garlon Hatchet, PA-C  triamcinolone cream (KENALOG) 0.1 % Apply 1 application topically 2 (two) times daily. 05/16/21   Palumbo, April, MD  amantadine (SYMMETREL) 100 MG capsule Take 1 capsule (100 mg total) by mouth 2 (two) times daily. Patient not taking: Reported on 11/04/2019 09/01/19 11/17/19  [name], NP    Allergies    Haldol [haloperidol]  Review of Systems   Review of Systems  Musculoskeletal:        See HPI  Skin: Negative.  Negative for color change and wound.  Neurological: Negative.  Negative for weakness and numbness.   Physical Exam Updated Vital Signs BP 128/73 (BP Location: Right Arm)   Pulse 73   Temp 97.9 F (36.6 C) (Oral)   Resp 18   SpO2 95%   Physical Exam Constitutional:      Appearance: He is well-developed.  Pulmonary:     Effort: Pulmonary effort is normal.  Musculoskeletal:        General: Normal range of motion.     Cervical back: Normal range of motion.     Comments: FROM left arm, full  strength. Normal pulse. No swelling or redness. No deformity.  Skin:    General: Skin is warm and dry.  Neurological:     Mental Status: He is alert and oriented to person, place, and time.    ED Results / Procedures / Treatments   Labs (all labs ordered are listed, but only abnormal results are displayed) Labs Reviewed - No data to display  EKG None  Radiology No results found.  Procedures Procedures   Medications Ordered in ED Medications - No data to display  ED Course  I have reviewed the triage vital signs and the nursing notes.  Pertinent labs & imaging results that were available during my care of the patient were reviewed by me and considered in my medical decision making (see chart for details).    MDM Rules/Calculators/A&P                            Patient to ED with left arm pain. Recurrent. No new injury.   No concerning exam findings. Tylenol provided.   Final Clinical Impression(s) / ED Diagnoses Final diagnoses:  None   Left arm pain, recurrent  Rx / DC Orders ED Discharge Orders     None        Elpidio Anis, PA-C 09/16/21 0346    Palumbo, April, MD 09/16/21 [phone]

## 2021-09-16 NOTE — Discharge Instructions (Addendum)
Followup with your doctor as needed

## 2021-09-17 ENCOUNTER — Ambulatory Visit (HOSPITAL_COMMUNITY)
Admission: EM | Admit: 2021-09-17 | Discharge: 2021-09-17 | Disposition: A | Discharge status: Home / Self Care | Payer: No Payment, Other | Attending: Psychiatry | Admitting: Psychiatry

## 2021-09-17 DIAGNOSIS — F250 Schizoaffective disorder, bipolar type: Secondary | ICD-10-CM | POA: Insufficient documentation

## 2021-09-17 DIAGNOSIS — Z79899 Other long term (current) drug therapy: Secondary | ICD-10-CM | POA: Insufficient documentation

## 2021-09-17 DIAGNOSIS — Z5900 Homelessness unspecified: Secondary | ICD-10-CM | POA: Insufficient documentation

## 2021-09-17 NOTE — ED Provider Notes (Signed)
[facility] Medical Screening Exam  Patient Name: Cole Barrett MRN: [id number] Date of Evaluation: 09/17/21 Chief Complaint:   Diagnosis:  Final diagnoses:  Schizoaffective disorder, bipolar type (HCC)    History of Present illness: Cole Barrett is a 35 y.o. male patient presented to [facility] as a walk in alone with complaints of "I need one dose of my medication".  Cole Barrett, 35 y.o., male patient seen face to face by this provider, consulted with Dr. Bronwen Betters; and chart reviewed on 09/17/21.  On evaluation Cole Barrett reports he currently takes Risperdal for his schizophrenia.  States he missed his dose last night because his medicine is not with him.  Patient is requesting to have 1 dose of Risperdal this AM.  States he has an outpatient provider and he has medication with refills.  States he will have to go across town to be able to get his medications.   During evaluation Cole Barrett is in sitting position in no acute distress.  He is alert/oriented x 4; calm/cooperative; mood is dysphoric with congruent affect.  He is speaking in a clear tone at moderate volume, and normal pace; with fair eye contact.  His thought process is coherent and relevant; There is no indication that he is currently responding to internal/external stimuli or experiencing delusional thought content.  He denies auditory and visual hallucinations.  He denies suicidal/self-harm/homicidal ideation, psychosis, and paranoia.  Denies any current alcohol or substance use.  Denies access to firearms/weapons.  Patient contracts for safety. Patient has remained calm throughout assessment and has answered questions appropriately.    Explained outpatient psychiatric services on the second floor including open access walk-in hours.  States at this time he does not need a provider or medication refills.  States, "I just want 1 dose of my Risperdal".   Explained to patient he would not be provided 1 dose of medication at this time.  Explained the importance of keeping medication with him at all times.  Patient is homeless.  Provided patient with bus passes that can get him across town to obtain his medications.  Patient agrees that he will leave the facility use the bus passes and get his medications as soon as possible.  At this time Cole Barrett is educated and verbalizes understanding of mental health resources and other crisis services in the community.  He is instructed to call 911 and present to the nearest emergency room should he experience any suicidal/homicidal ideation, auditory/visual/hallucinations, or detrimental worsening of his mental health condition.  He was a also advised by Clinical research associate that he could call the toll-free phone on insurance card to assist with identifying in network counselors and agencies or number on back of Medicaid card to speak with care coordinator.    Psychiatric Specialty Exam  Presentation  General Appearance:Appropriate for Environment; Disheveled  Eye Contact:Fair  Speech:Clear and Coherent; Normal Rate  Speech Volume:Normal  Handedness:Right   Mood and Affect  Mood:Dysphoric  Affect:Appropriate; Congruent   Thought Process  Thought Processes:Coherent  Descriptions of Associations:Intact  Orientation:Full (Time, Place and Person)  Thought Content:Logical  Diagnosis of Schizophrenia or Schizoaffective disorder in past: Yes  Duration of Psychotic Symptoms: Greater than six months  Hallucinations:None hearing loud voices that wants to kill him.  Ideas of Reference:None  Suicidal Thoughts:No  Homicidal Thoughts:No   Sensorium  Memory:Immediate Good; Recent Good; Remote Good  Judgment:Poor  Insight:Fair   Executive Functions  Concentration:Fair  Attention Span:Fair  Recall:Good  Fund of Knowledge:Good  Language:Good   Psychomotor Activity  Psychomotor  Activity:Normal   Assets  Assets:Communication Skills; Desire for Improvement; Physical Health   Sleep  Sleep:Good  Number of hours: 8   No data recorded  Physical Exam: Physical Exam Vitals and nursing note reviewed.  Constitutional:      Appearance: He is well-developed.  HENT:     Head: Normocephalic and atraumatic.  Eyes:     General:        Right eye: No discharge.        Left eye: No discharge.     Conjunctiva/sclera: Conjunctivae normal.  Cardiovascular:     Rate and Rhythm: Normal rate.     Heart sounds: No murmur heard. Pulmonary:     Effort: Pulmonary effort is normal. No respiratory distress.  Musculoskeletal:        General: Normal range of motion.     Cervical back: Normal range of motion.  Skin:    Coloration: Skin is not jaundiced or pale.  Neurological:     Mental Status: He is alert and oriented to person, place, and time.  Psychiatric:        Attention and Perception: Attention and perception normal.        Mood and Affect: Mood and affect normal.        Speech: Speech normal.        Behavior: Behavior normal. Behavior is cooperative.        Thought Content: Thought content normal.        Cognition and Memory: Cognition normal.        Judgment: Judgment normal.   Review of Systems  Constitutional: Negative.   HENT: Negative.    Eyes: Negative.   Respiratory: Negative.  Negative for cough.   Cardiovascular: Negative.  Negative for chest pain.  Musculoskeletal: Negative.   Skin: Negative.   Neurological: Negative.   Psychiatric/Behavioral: Negative.    Blood pressure 135/63, pulse 90, temperature 98.9 F (37.2 C), temperature source Oral, resp. rate 20, SpO2 99 %. There is no height or weight on file to calculate BMI.  Musculoskeletal: Strength & Muscle Tone: within normal limits Gait & Station: normal Patient leans: N/A   [facility] MSE Discharge Disposition for Follow up and Recommendations: Based on my evaluation the patient does not  appear to have an emergency medical condition and can be discharged with resources and follow up care in outpatient services for Medication Management  Discharge patient  Provided resources for outpatient psychiatric services on the second floor including open access walk-in hours.  No evidence of imminent risk to self or others at present.    Patient does not meet criteria for psychiatric inpatient admission. Discussed crisis plan, support from social network, calling 911, coming to the Emergency Department, and calling Suicide Hotline.   Ardis Hughs, NP 09/17/2021, 8:28 AM

## 2021-09-17 NOTE — Discharge Summary (Signed)
Cole Barrett to be D/C'd Home per NP order. Pt refused AVS. Patient escorted out and D/C home via private auto.  [name]  09/17/2021 7:42 AM

## 2021-09-17 NOTE — Discharge Instructions (Signed)

## 2021-09-17 NOTE — Progress Notes (Signed)
TRIAGE: ROUTINE   09/17/21 0445  [facility] Triage Screening (Walk-ins at [facility] only)  How Did You Hear About [place]? Self  What Is the Reason for Your Visit/Call Today? Pt states he came to the [facility] "to get some Risperdal." Pt states he has not had his Thursday night dose and that he would like to get that dose at the [facility] this morning. Pt verified he took his medication at home on Wednesday night; he confirms he has his bottle of Risperdal at home currently but that, since he doesn't have it with him, he would like to be provided a dose at the [facility]. Pt denies a current SI, a hx of SI, any prior attempts to kill himself, a plan to kill himself, or any prior hospitalizations for mental health care. Pt denies HI, AVH, NSSIB, access to gusn/weapons, engagement with the legal system, or SA.  How Long Has This Been Causing You Problems? <Week  Have You Recently Had Any Thoughts About Hurting Yourself? No  Are You Planning to Commit Suicide/Harm Yourself At This time? No  Have you Recently Had Thoughts About Hurting Someone [name]? No  Are You Planning To Harm Someone At This Time? No  Are you currently experiencing any auditory, visual or other hallucinations? No  Please explain the hallucinations you are currently experiencing: N/A  Have You Used Any Alcohol or Drugs in the Past 24 Hours? No  Do you have any current medical co-morbidities that require immediate attention? No  Clinician description of patient physical appearance/behavior: Pt answered the questions posed. He is able to identify his thoughts, feelings, and concerns.  What Do You Feel Would Help You the Most Today? Medication(s)  If access to [facility] was not available, would you have sought care in the Emergency Department? Yes  Determination of Need Routine (7 days)  Options For Referral Medication Management;Outpatient Therapy

## 2021-09-18 ENCOUNTER — Emergency Department (HOSPITAL_COMMUNITY)
Admission: EM | Admit: 2021-09-18 | Discharge: 2021-09-18 | Disposition: A | Discharge status: Home / Self Care | Payer: Self-pay | Attending: Emergency Medicine | Admitting: Emergency Medicine

## 2021-09-18 ENCOUNTER — Encounter (HOSPITAL_COMMUNITY): Payer: Self-pay | Admitting: Emergency Medicine

## 2021-09-18 DIAGNOSIS — F17210 Nicotine dependence, cigarettes, uncomplicated: Secondary | ICD-10-CM | POA: Insufficient documentation

## 2021-09-18 DIAGNOSIS — Z8616 Personal history of COVID-19: Secondary | ICD-10-CM | POA: Insufficient documentation

## 2021-09-18 DIAGNOSIS — Z760 Encounter for issue of repeat prescription: Secondary | ICD-10-CM | POA: Insufficient documentation

## 2021-09-18 DIAGNOSIS — Z8659 Personal history of other mental and behavioral disorders: Secondary | ICD-10-CM | POA: Insufficient documentation

## 2021-09-18 MED ORDER — RISPERIDONE 1 MG PO TABS
1.0000 mg | ORAL_TABLET | Freq: Once | ORAL | Status: AC
  Administered 2021-09-18: 1 mg via ORAL
  Filled 2021-09-18: qty 1

## 2021-09-18 NOTE — Discharge Instructions (Signed)
Follow up with your doctor for regular treatment.

## 2021-09-18 NOTE — ED Triage Notes (Signed)
Pt requesting dose of Risperdal.  

## 2021-09-18 NOTE — ED Provider Notes (Signed)
[facility]-EMERGENCY DEPT Provider Note   CSN: [id number] Arrival date & time: 09/18/21  0221     History Chief Complaint  Patient presents with   Medication Refill    Cole Barrett is a 35 y.o. male.  Patient to ED requesting medication to keep his schizophrenia under control. He reports he has a prescription for his Risperdal but not with him and can't retreive new meds until tomorrow.   The history is provided by the patient. No language interpreter was used.  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 No pertinent surgical history.     Family History  Problem Relation Age of Onset   Hypertension Mother     Social History   Tobacco Use   Smoking status: Every Day    Packs/day: 0.50    Types: Cigarettes   Smokeless tobacco: Never  Vaping Use   Vaping Use: Never used  Substance Use Topics   Alcohol use: Yes    Comment: BAC not available   Drug use: Yes    Types: Marijuana    Comment: Last used: 2 months ago UDS NA    Home Medications Prior to Admission  medications   Medication Sig Start Date End Date Taking? Authorizing Provider  ibuprofen (ADVIL) 600 MG tablet Take 1 tablet (600 mg total) by mouth every 6 (six) hours as needed. 03/15/21   Linwood Dibbles, MD  risperiDONE (RISPERDAL) 1 MG tablet Take 1 tablet (1 mg total) by mouth at bedtime. 08/30/21   Garlon Hatchet, PA-C  triamcinolone cream (KENALOG) 0.1 % Apply 1 application topically 2 (two) times daily. 05/16/21   Palumbo, April, MD  amantadine (SYMMETREL) 100 MG capsule Take 1 capsule (100 mg total) by mouth 2 (two) times daily. Patient not taking: Reported on 11/04/2019 09/01/19 11/17/19  [name], NP    Allergies    Haldol [haloperidol]  Review of Systems   Review of Systems  Constitutional:  Negative for chills and fever.  HENT: Negative.    Respiratory: Negative.    Cardiovascular: Negative.   Gastrointestinal: Negative.   Musculoskeletal: Negative.   Skin: Negative.   Neurological: Negative.   Psychiatric/Behavioral:  Positive for hallucinations.    Physical Exam Updated Vital Signs BP 129/75 (BP Location: Left Arm)   Pulse 72   Temp 98.1 F (36.7 C) (Oral)   Resp 15   Ht 5\' 10"  (1.778 m)   Wt 102.5 kg   SpO2 97%   BMI 32.43 kg/m   Physical Exam Vitals and nursing note reviewed.  Constitutional:  Appearance: He is well-developed.  Pulmonary:     Effort: Pulmonary effort is normal.  Musculoskeletal:        General: Normal range of motion.     Cervical back: Normal range of motion.  Skin:    General: Skin is warm and dry.  Neurological:     Mental Status: He is alert and oriented to person, place, and time.    ED Results / Procedures / Treatments   Labs (all labs ordered are listed, but only abnormal results are displayed) Labs Reviewed - No data to display  EKG None  Radiology No results found.  Procedures Procedures   Medications Ordered in ED Medications  risperiDONE (RISPERDAL) tablet 1 mg (has no administration in time range)     ED Course  I have reviewed the triage vital signs and the nursing notes.  Pertinent labs & imaging results that were available during my care of the patient were reviewed by me and considered in my medical decision making (see chart for details).    MDM Rules/Calculators/A&P                           Patient well known to the ED with 29 recent visits. Frequently seen for medication with difficulty keeping his medications with him.   He is dosed with 1 mg Risperdal.   Final Clinical Impression(s) / ED Diagnoses Final diagnoses:  None   History of schizophrenia  Rx / DC Orders ED Discharge Orders     None        Elpidio Anis, PA-C 09/18/21 0420    Gilda Crease, MD 09/18/21 [phone]

## 2021-09-19 ENCOUNTER — Emergency Department (HOSPITAL_COMMUNITY): Admission: EM | Admit: 2021-09-19 | Discharge: 2021-09-19 | Discharge status: Against Medical Advice | Payer: Self-pay

## 2021-09-19 ENCOUNTER — Other Ambulatory Visit: Payer: Self-pay

## 2021-09-20 ENCOUNTER — Telehealth (HOSPITAL_COMMUNITY): Payer: Self-pay

## 2021-09-20 NOTE — BH Assessment (Signed)
Care Management - Follow Up [facility] Discharges   Writer attempted to make contact with patient today and was unsuccessful.  Patient voicemail was not set up. Phone just rang.   Per chart review, patient was provided resources for outpatient psychiatric services on the second floor including open access walk-in hours.

## 2021-09-21 ENCOUNTER — Encounter (HOSPITAL_COMMUNITY): Payer: Self-pay | Admitting: Emergency Medicine

## 2021-09-21 ENCOUNTER — Emergency Department (HOSPITAL_COMMUNITY)
Admission: EM | Admit: 2021-09-21 | Discharge: 2021-09-21 | Disposition: A | Discharge status: Home / Self Care | Payer: Self-pay | Attending: Emergency Medicine | Admitting: Emergency Medicine

## 2021-09-21 ENCOUNTER — Other Ambulatory Visit: Payer: Self-pay

## 2021-09-21 DIAGNOSIS — F17210 Nicotine dependence, cigarettes, uncomplicated: Secondary | ICD-10-CM | POA: Insufficient documentation

## 2021-09-21 DIAGNOSIS — Z8616 Personal history of COVID-19: Secondary | ICD-10-CM | POA: Insufficient documentation

## 2021-09-21 DIAGNOSIS — Z79899 Other long term (current) drug therapy: Secondary | ICD-10-CM

## 2021-09-21 DIAGNOSIS — Z7189 Other specified counseling: Secondary | ICD-10-CM | POA: Insufficient documentation

## 2021-09-21 DIAGNOSIS — Z760 Encounter for issue of repeat prescription: Secondary | ICD-10-CM | POA: Insufficient documentation

## 2021-09-21 MED ORDER — RISPERIDONE 1 MG PO TABS
1.0000 mg | ORAL_TABLET | Freq: Once | ORAL | Status: AC
  Administered 2021-09-21: 1 mg via ORAL
  Filled 2021-09-21: qty 1

## 2021-09-21 NOTE — ED Triage Notes (Signed)
Patient presents requesting his risperdal. No other complaints.

## 2021-09-21 NOTE — ED Provider Notes (Signed)
[facility]-EMERGENCY DEPT Provider Note   CSN: [id number] Arrival date & time: 09/21/21  0107     History Chief Complaint  Patient presents with   Medication Refill    Cole Barrett is a 35 y.o. male presents to the emergency department requesting nightly medication.  He is well-known.  He takes Resporal 1 mg.  Patient reports he has not had this medication in several days.  Denies any other symptoms or concerns.  Denies aggravating or alleviating factors.  The history is provided by the patient and medical records. No language interpreter was used.      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 No pertinent surgical history.     Family History  Problem Relation Age of Onset   Hypertension Mother     Social History   Tobacco Use   Smoking status: Every Day    Packs/day: 0.50    Types: Cigarettes   Smokeless tobacco: Never  Vaping Use   Vaping Use: Never used  Substance Use Topics   Alcohol use: Yes    Comment: BAC not available   Drug use: Yes    Types: Marijuana    Comment: Last  used: 2 months ago UDS NA    Home Medications Prior to Admission medications   Medication Sig Start Date End Date Taking? Authorizing Provider  ibuprofen (ADVIL) 600 MG tablet Take 1 tablet (600 mg total) by mouth every 6 (six) hours as needed. 03/15/21   Linwood Dibbles, MD  risperiDONE (RISPERDAL) 1 MG tablet Take 1 tablet (1 mg total) by mouth at bedtime. 08/30/21   Garlon Hatchet, PA-C  triamcinolone cream (KENALOG) 0.1 % Apply 1 application topically 2 (two) times daily. 05/16/21   Palumbo, April, MD  amantadine (SYMMETREL) 100 MG capsule Take 1 capsule (100 mg total) by mouth 2 (two) times daily. Patient not taking: Reported on 11/04/2019 09/01/19 11/17/19  [name], NP    Allergies    Haldol [haloperidol]  Review of Systems   Review of Systems  Constitutional:  Negative for appetite change, diaphoresis, fatigue, fever and unexpected weight change.  HENT:  Negative for mouth sores.   Eyes:  Negative for visual disturbance.  Respiratory:  Negative for cough, chest tightness, shortness of breath and wheezing.   Cardiovascular:  Negative for chest pain.  Gastrointestinal:  Negative for abdominal pain, constipation, diarrhea, nausea and vomiting.  Endocrine: Negative for polydipsia, polyphagia and polyuria.  Genitourinary:  Negative for dysuria, frequency, hematuria and urgency.  Musculoskeletal:  Negative for back pain and neck stiffness.  Skin:  Negative for rash.  Allergic/Immunologic: Negative  for immunocompromised state.  Neurological:  Negative for syncope, light-headedness and headaches.  Hematological:  Does not bruise/bleed easily.  Psychiatric/Behavioral:  Negative for sleep disturbance. The patient is not nervous/anxious.    Physical Exam Updated Vital Signs BP 126/72   Pulse 66   Temp (!) 97.4 F (36.3 C) (Oral)   Resp 17   Ht 5\' 10"  (1.778 m)   Wt 108.9 kg   SpO2 98%   BMI 34.44 kg/m   Physical Exam Vitals and nursing note reviewed.  Constitutional:       General: He is not in acute distress.    Appearance: He is well-developed. He is not ill-appearing.  HENT:     Head: Normocephalic.  Eyes:     General: No scleral icterus.    Conjunctiva/sclera: Conjunctivae normal.  Cardiovascular:     Rate and Rhythm: Normal rate.  Pulmonary:     Effort: Pulmonary effort is normal.  Abdominal:     General: There is no distension.  Musculoskeletal:        General: Normal range of motion.     Cervical back: Normal range of motion.  Skin:    General: Skin is warm and dry.  Neurological:     Mental Status: He is alert.  Psychiatric:        Mood and Affect: Mood normal.    ED Results / Procedures / Treatments   Labs (all labs ordered are listed, but only abnormal results are displayed) Labs Reviewed - No data to display  EKG None  Radiology No results found.  Procedures Procedures   Medications Ordered in ED Medications  risperiDONE (RISPERDAL) tablet 1 mg (has no administration in time range)    ED Course  I have reviewed the triage vital signs and the nursing notes.  Pertinent labs & imaging results that were available during my care of the patient were reviewed by me and considered in my medical decision making (see chart for details).    MDM Rules/Calculators/A&P                           Patient presents without complaint requesting his nightly medication.  Risperdal 1 mg given.  Patient discharged in stable condition.   Final Clinical Impression(s) / ED Diagnoses Final diagnoses:  Medication management    Rx / DC Orders ED Discharge Orders     None        Cole Barrett, 09/21/21 0258    09/23/21, MD 09/21/21 [phone]

## 2021-09-22 ENCOUNTER — Other Ambulatory Visit: Payer: Self-pay

## 2021-09-22 ENCOUNTER — Emergency Department (HOSPITAL_COMMUNITY)
Admission: EM | Admit: 2021-09-22 | Discharge: 2021-09-22 | Disposition: A | Discharge status: Home / Self Care | Payer: Self-pay | Attending: Emergency Medicine | Admitting: Emergency Medicine

## 2021-09-22 ENCOUNTER — Encounter (HOSPITAL_COMMUNITY): Payer: Self-pay | Admitting: Emergency Medicine

## 2021-09-22 DIAGNOSIS — Z760 Encounter for issue of repeat prescription: Secondary | ICD-10-CM | POA: Insufficient documentation

## 2021-09-22 DIAGNOSIS — Z8616 Personal history of COVID-19: Secondary | ICD-10-CM | POA: Insufficient documentation

## 2021-09-22 DIAGNOSIS — F17210 Nicotine dependence, cigarettes, uncomplicated: Secondary | ICD-10-CM | POA: Insufficient documentation

## 2021-09-22 DIAGNOSIS — Z7689 Persons encountering health services in other specified circumstances: Secondary | ICD-10-CM

## 2021-09-22 MED ORDER — RISPERIDONE 1 MG PO TABS
1.0000 mg | ORAL_TABLET | Freq: Every day | ORAL | Status: DC
  Administered 2021-09-22: 1 mg via ORAL
  Filled 2021-09-22: qty 1

## 2021-09-22 NOTE — ED Provider Notes (Signed)
[facility] EMERGENCY DEPARTMENT Provider Note   CSN: [id number] Arrival date & time: 09/22/21  0549     History Chief Complaint  Patient presents with   Medication Refill    Cole Barrett is a 35 y.o. male.  35 year old male with history of homelessness, dyslipidemia, schizophrenia presents to the emergency department requesting a dose of his Risperdal.  Reports that he has his prescription at home to take, but did not have it with him.  He is requesting to receive a dose of his medication at this time.  Reports auditory hallucinations which is his baseline.  No command hallucinations, suicidal ideations, homicidal ideations.  The history is provided by the patient. No language interpreter was used.  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 No pertinent surgical history.     Family History  Problem Relation Age of Onset   Hypertension Mother     Social History   Tobacco Use   Smoking status: Every Day    Packs/day: 0.50    Types: Cigarettes   Smokeless tobacco: Never   Vaping Use   Vaping Use: Never used  Substance Use Topics   Alcohol use: Yes    Comment: BAC not available   Drug use: Yes    Types: Marijuana    Comment: Last used: 2 months ago UDS NA    Home Medications Prior to Admission medications   Medication Sig Start Date End Date Taking? Authorizing Provider  ibuprofen (ADVIL) 600 MG tablet Take 1 tablet (600 mg total) by mouth every 6 (six) hours as needed. 03/15/21   Linwood Dibbles, MD  risperiDONE (RISPERDAL) 1 MG tablet Take 1 tablet (1 mg total) by mouth at bedtime. 08/30/21   Garlon Hatchet, PA-C  triamcinolone cream (KENALOG) 0.1 % Apply 1 application topically 2 (two) times daily. 05/16/21   Palumbo, April, MD  amantadine (SYMMETREL) 100 MG capsule Take 1 capsule (100 mg total) by mouth 2 (two) times daily. Patient not taking: Reported on 11/04/2019 09/01/19 11/17/19  [name], NP    Allergies    Haldol [haloperidol]  Review of Systems   Review of Systems Ten systems reviewed and are negative for acute change, except as noted in the HPI.    Physical Exam Updated Vital Signs BP 120/68 (BP Location: Right Arm)   Pulse 66   Temp (!) 97.4 F (36.3 C) (Oral)   Resp 16   SpO2 100%   Physical Exam Vitals and nursing note reviewed.  Constitutional:      General: He is not in acute distress.  Appearance: He is well-developed. He is not diaphoretic.  HENT:     Head: Normocephalic and atraumatic.  Eyes:     General: No scleral icterus.    Conjunctiva/sclera: Conjunctivae normal.  Pulmonary:     Effort: Pulmonary effort is normal. No respiratory distress.  Musculoskeletal:        General: Normal range of motion.     Cervical back: Normal range of motion.  Skin:    General: Skin is warm and dry.     Coloration: Skin is not pale.     Findings: No erythema or rash.  Neurological:     Mental Status: He is alert and oriented to person, place, and time.  Psychiatric:        Behavior: Behavior normal.     Comments: Denies  SI/HI. Calm and cooperative.    ED Results / Procedures / Treatments   Labs (all labs ordered are listed, but only abnormal results are displayed) Labs Reviewed - No data to display  EKG None  Radiology No results found.  Procedures Procedures   Medications Ordered in ED Medications  risperiDONE (RISPERDAL) tablet 1 mg (1 mg Oral Given 09/22/21 7353)    ED Course  I have reviewed the triage vital signs and the nursing notes.  Pertinent labs & imaging results that were available during my care of the patient were reviewed by me and considered in my medical decision making (see chart for details).    MDM Rules/Calculators/A&P                           35 year old male presenting to the ED requesting a dose of his Risperdal.  This was given.  His vital signs have been stable.  Patient denies SI/HI.  Does not appear to presently be a harm to himself or others.  Stable for discharge.  Outpatient resources given.   Final Clinical Impression(s) / ED Diagnoses Final diagnoses:  Encounter for medication administration    Rx / DC Orders ED Discharge Orders     None        [name], PA-C 09/22/21 0631    [name], MD 09/22/21 [phone]

## 2021-09-22 NOTE — ED Triage Notes (Signed)
Patient states he needs his dose of Risperdal.  Denies any SI/HI.  States he is hearing voices.

## 2021-09-23 ENCOUNTER — Emergency Department (HOSPITAL_COMMUNITY)
Admission: EM | Admit: 2021-09-23 | Discharge: 2021-09-23 | Disposition: A | Discharge status: Home / Self Care | Payer: Self-pay | Attending: Emergency Medicine | Admitting: Emergency Medicine

## 2021-09-23 DIAGNOSIS — Z8616 Personal history of COVID-19: Secondary | ICD-10-CM | POA: Insufficient documentation

## 2021-09-23 DIAGNOSIS — M79602 Pain in left arm: Secondary | ICD-10-CM | POA: Insufficient documentation

## 2021-09-23 DIAGNOSIS — F17210 Nicotine dependence, cigarettes, uncomplicated: Secondary | ICD-10-CM | POA: Insufficient documentation

## 2021-09-23 MED ORDER — NAPROXEN 250 MG PO TABS
500.0000 mg | ORAL_TABLET | Freq: Once | ORAL | Status: AC
  Administered 2021-09-23: 500 mg via ORAL
  Filled 2021-09-23: qty 2

## 2021-09-23 NOTE — Discharge Instructions (Addendum)
Thank you for allowing me to care for you today in the Emergency Department.   Take 650 mg of Tylenol or 600 mg of ibuprofen with food every 6 hours for pain.  You can alternate between these 2 medications every 3 hours if your pain returns.  For instance, you can take Tylenol at noon, followed by a dose of ibuprofen at 3, followed by second dose of Tylenol and 6.  Return to the emergency department if your entire forearm is swollen, if you have new numbness or weakness, new falls or injuries, or other new, concerning symptoms.

## 2021-09-23 NOTE — ED Notes (Signed)
Called for triage no answer  

## 2021-09-23 NOTE — ED Provider Notes (Signed)
[name] EMERGENCY DEPARTMENT Provider Note   CSN: [id number] Arrival date & time: 09/23/21  0455     History Chief Complaint  Patient presents with   Arm Pain    Left    Cole Barrett is a 35 y.o. male with a history of alcohol use disorder, schizophrenia who is well-known to this emergency department with 32 visits over the last 6 months.  He presents with left arm pain.  He has been evaluated for this multiple times over the last few weeks.  He characterizes the pain as a 6 out of 10, aching.  Pain is worse with movement.  No other known aggravating or alleviating factors.  No new falls or injuries, redness, warmth, fever, chills, wounds, red streaking, joint pain, chest pain, shortness of breath.  He states that he plans on going to the pharmacy to get the prescription he was given during a previous ER visit fill today for pain, but is requesting 1 dose of pain medication the emergency department to tide him over.  The history is provided by the patient and medical records. No language interpreter was used.      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     Family History  Problem Relation Age of Onset   Hypertension Mother     Social History   Tobacco Use   Smoking status: Every Day    Packs/day: 0.50    Types: Cigarettes   Smokeless tobacco: Never  Vaping Use   Vaping Use: Never used  Substance Use Topics   Alcohol use: Yes    Comment: BAC not available   Drug use: Yes    Types: Marijuana    Comment: Last used: 2 months ago UDS NA    Home Medications Prior to Admission medications   Medication Sig Start Date End Date Taking? Authorizing Provider  ibuprofen (ADVIL) 600 MG tablet Take 1 tablet (600 mg total) by mouth every 6 (six) hours as needed. 03/15/21   Linwood Dibbles, MD  risperiDONE (RISPERDAL) 1 MG tablet Take 1 tablet (1 mg total) by mouth at bedtime. 08/30/21   Garlon Hatchet, PA-C  triamcinolone cream (KENALOG) 0.1 % Apply 1 application topically 2 (two) times daily. 05/16/21   Palumbo, April, MD  amantadine (SYMMETREL) 100 MG capsule Take 1 capsule (100 mg total) by mouth 2 (two) times daily. Patient not taking: Reported on 11/04/2019 09/01/19 11/17/19  [name], NP    Allergies    Haldol [haloperidol]  Review of Systems   Review of Systems  Constitutional:  Negative for  appetite change, chills and fever.  HENT:  Negative for congestion and sore throat.   Respiratory:  Negative for cough, shortness of breath and wheezing.   Cardiovascular:  Negative for chest pain and palpitations.  Gastrointestinal:  Negative for abdominal pain, diarrhea, nausea and vomiting.  Genitourinary:  Negative for dysuria.  Musculoskeletal:  Positive for arthralgias and myalgias. Negative for back pain, neck pain and neck stiffness.  Skin:  Negative for rash.  Allergic/Immunologic: Negative for immunocompromised state.  Neurological:  Negative for dizziness, syncope, weakness, light-headedness, numbness and headaches.  Psychiatric/Behavioral:  Negative  for confusion.    Physical Exam Updated Vital Signs BP 129/76   Pulse 66   Temp 97.9 F (36.6 C) (Oral)   Resp 17   SpO2 100%   Physical Exam Vitals and nursing note reviewed.  Constitutional:      Appearance: He is well-developed.  HENT:     Head: Normocephalic.  Eyes:     Conjunctiva/sclera: Conjunctivae normal.  Cardiovascular:     Rate and Rhythm: Normal rate and regular rhythm.     Heart sounds: No murmur heard. Pulmonary:     Effort: Pulmonary effort is normal.  Abdominal:     General: There is no distension.     Palpations: Abdomen is soft.  Musculoskeletal:     Cervical back: Neck supple.     Comments: Joints of the left upper extremity are nontender to palpation.  No erythema or warmth.  Muscular compartments of the bilateral upper extremities are soft.  He is neurovascular intact.  No wounds.  Minimal tenderness palpation to the left proximal forearm.  Skin:    General: Skin is warm and dry.  Neurological:     Mental Status: He is alert.  Psychiatric:        Behavior: Behavior normal.    ED Results / Procedures / Treatments   Labs (all labs ordered are listed, but only abnormal results are displayed) Labs Reviewed - No data to display  EKG None  Radiology No results found.  Procedures Procedures   Medications Ordered in ED Medications  naproxen (NAPROSYN) tablet 500 mg (500 mg Oral Given 09/23/21 6440)    ED Course  I have reviewed the triage vital signs and the nursing notes.  Pertinent labs & imaging results that were available during my care of the patient were reviewed by me and considered in my medical decision making (see chart for details).    MDM Rules/Calculators/A&P                           35 year old male history of homelessness and schizophrenia who is well-known to this emergency department with 32 visits over 6 months.  Vital signs are stable.  Physical exam is reassuring.  Doubt compartment syndrome, myositis, cellulitis,  abscess, septic joint, gout.  I do question malingering as the patient has been seen and evaluated in the ED daily.  He was given naproxen.  Safer discharge home with outpatient follow-up as discussed.  Final Clinical Impression(s) / ED Diagnoses Final diagnoses:  Left arm pain    Rx / DC Orders ED Discharge Orders     None        Othar Curto A, PA-C 09/23/21 0645    Melene Plan, DO 09/23/21 2334

## 2021-09-23 NOTE — ED Triage Notes (Signed)
Pt c/o left lower arm pain. States injury several days ago. Want pain medicine

## 2021-09-25 ENCOUNTER — Emergency Department (HOSPITAL_COMMUNITY)
Admission: EM | Admit: 2021-09-25 | Discharge: 2021-09-25 | Disposition: A | Discharge status: Home / Self Care | Payer: Self-pay | Attending: Emergency Medicine | Admitting: Emergency Medicine

## 2021-09-25 ENCOUNTER — Other Ambulatory Visit: Payer: Self-pay

## 2021-09-25 ENCOUNTER — Encounter (HOSPITAL_COMMUNITY): Payer: Self-pay | Admitting: Emergency Medicine

## 2021-09-25 DIAGNOSIS — Z760 Encounter for issue of repeat prescription: Secondary | ICD-10-CM | POA: Insufficient documentation

## 2021-09-25 DIAGNOSIS — Z8616 Personal history of COVID-19: Secondary | ICD-10-CM | POA: Insufficient documentation

## 2021-09-25 DIAGNOSIS — Z79899 Other long term (current) drug therapy: Secondary | ICD-10-CM

## 2021-09-25 DIAGNOSIS — F17210 Nicotine dependence, cigarettes, uncomplicated: Secondary | ICD-10-CM | POA: Insufficient documentation

## 2021-09-25 MED ORDER — RISPERIDONE 1 MG PO TABS
1.0000 mg | ORAL_TABLET | Freq: Once | ORAL | Status: AC
  Administered 2021-09-25: 1 mg via ORAL
  Filled 2021-09-25: qty 1

## 2021-09-25 NOTE — ED Notes (Signed)
Patient inside bathroom .

## 2021-09-25 NOTE — ED Provider Notes (Signed)
[facility] EMERGENCY DEPARTMENT Provider Note   CSN: [id number] Arrival date & time: 09/25/21  0024     History Chief Complaint  Patient presents with   Medication Refill    Cole Barrett is a 35 y.o. male.  Patient with history of schizophrenia requesting a dose of Risperdal.  Frequent ED visits for same.  Denies any medical complaints.  States that he is doing well otherwise.      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 No pertinent surgical history.     Family History  Problem Relation Age of Onset   Hypertension Mother     Social History   Tobacco Use   Smoking status: Every Day    Packs/day: 0.50    Types: Cigarettes   Smokeless tobacco: Never  Vaping Use   Vaping Use: Never used  Substance Use Topics   Alcohol use: Yes    Comment: BAC not available   Drug use: Yes    Types: Marijuana    Comment: Last used: 2 months ago UDS NA    Home Medications Prior to Admission medications   Medication Sig Start Date End Date Taking? Authorizing Provider  ibuprofen (ADVIL) 600 MG  tablet Take 1 tablet (600 mg total) by mouth every 6 (six) hours as needed. 03/15/21   Linwood Dibbles, MD  risperiDONE (RISPERDAL) 1 MG tablet Take 1 tablet (1 mg total) by mouth at bedtime. 08/30/21   Garlon Hatchet, PA-C  triamcinolone cream (KENALOG) 0.1 % Apply 1 application topically 2 (two) times daily. 05/16/21   Palumbo, April, MD  amantadine (SYMMETREL) 100 MG capsule Take 1 capsule (100 mg total) by mouth 2 (two) times daily. Patient not taking: Reported on 11/04/2019 09/01/19 11/17/19  [name], NP    Allergies    Haldol [haloperidol]  Review of Systems   Review of Systems  Neurological:  Negative for headaches.  Psychiatric/Behavioral:  Negative for agitation and hallucinations.    Physical Exam Updated Vital Signs BP 117/78 (BP Location: Right Arm)   Pulse 78   Temp 97.9 F (36.6 C) (Oral)   Resp 20   SpO2 100%   Physical Exam Vitals and nursing note reviewed.  Constitutional:      Appearance: He is well-developed.  HENT:     Head: Normocephalic and atraumatic.  Eyes:     Conjunctiva/sclera: Conjunctivae normal.  Pulmonary:     Effort: No respiratory distress.  Musculoskeletal:     Cervical back: Normal range of motion and neck supple.  Skin:    General: Skin is warm and dry.  Neurological:  Mental Status: He is alert.  Psychiatric:        Attention and Perception: He is attentive.        Mood and Affect: Mood normal.        Speech: Speech normal.     Comments: Does not appear to be responding to internal stimuli at time of exam.    ED Results / Procedures / Treatments   Labs (all labs ordered are listed, but only abnormal results are displayed) Labs Reviewed - No data to display  EKG None  Radiology No results found.  Procedures Procedures   Medications Ordered in ED Medications  risperiDONE (RISPERDAL) tablet 1 mg (has no administration in time range)    ED Course  I have reviewed the triage vital signs and the nursing  notes.  Pertinent labs & imaging results that were available during my care of the patient were reviewed by me and considered in my medical decision making (see chart for details).  Patient seen and examined.  He is here frequently for chronic medications.  I ordered a dose of Risperdal.  Encourage patient to obtain chronic prescription and take at home.  Vital signs reviewed and are as follows: BP 117/78 (BP Location: Right Arm)   Pulse 78   Temp 97.9 F (36.6 C) (Oral)   Resp 20   SpO2 100%       MDM Rules/Calculators/A&P                           Patient here for dose of his chronic medication.  He appears stable based on previous chart review.  He does not appear to be responding to internal stimuli and is appropriate.   Final Clinical Impression(s) / ED Diagnoses Final diagnoses:  Medication management    Rx / DC Orders ED Discharge Orders     None        Renne Crigler, PA-C 09/25/21 6433    Wynetta Fines, MD 09/26/21 [phone]

## 2021-09-25 NOTE — Discharge Instructions (Signed)
Please get and take your Risperdal prescription as prescribed.

## 2021-09-25 NOTE — ED Notes (Signed)
Patient Alert and oriented to baseline. Stable and ambulatory to baseline. Patient verbalized understanding of the discharge instructions.  Patient belongings were taken by the patient.   

## 2021-09-25 NOTE — ED Triage Notes (Signed)
Patient found sleeping in the toilet at waiting area .

## 2021-09-25 NOTE — ED Triage Notes (Signed)
Patient requesting prescription for Risperidal .

## 2021-09-27 ENCOUNTER — Encounter (HOSPITAL_COMMUNITY): Payer: Self-pay | Admitting: Emergency Medicine

## 2021-09-27 ENCOUNTER — Emergency Department (HOSPITAL_COMMUNITY): Payer: Self-pay

## 2021-09-27 ENCOUNTER — Other Ambulatory Visit: Payer: Self-pay

## 2021-09-27 ENCOUNTER — Emergency Department (HOSPITAL_COMMUNITY)
Admission: EM | Admit: 2021-09-27 | Discharge: 2021-09-27 | Disposition: A | Discharge status: Home / Self Care | Payer: Self-pay | Attending: Emergency Medicine | Admitting: Emergency Medicine

## 2021-09-27 DIAGNOSIS — F17210 Nicotine dependence, cigarettes, uncomplicated: Secondary | ICD-10-CM | POA: Insufficient documentation

## 2021-09-27 DIAGNOSIS — Z8616 Personal history of COVID-19: Secondary | ICD-10-CM | POA: Insufficient documentation

## 2021-09-27 DIAGNOSIS — M79602 Pain in left arm: Secondary | ICD-10-CM | POA: Insufficient documentation

## 2021-09-27 MED ORDER — IBUPROFEN 400 MG PO TABS
400.0000 mg | ORAL_TABLET | Freq: Once | ORAL | Status: AC
  Administered 2021-09-27: 400 mg via ORAL
  Filled 2021-09-27: qty 1

## 2021-09-27 NOTE — ED Provider Notes (Signed)
[facility]   CSN: [id number] Arrival date & time: 09/27/21  [id number]     History Chief Complaint  Patient presents with   Arm Pain     Cole Barrett is a 35 y.o. male.  35 year old male presents today for evaluation of left arm pain.  Of note patient has had multiple visits for same complaint to this ED recently.  Patient denies any change or trauma to his left arm.  He is without fever, swelling, or limitation in his range of motion.   The history is provided by the patient. No language interpreter was used.      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 No pertinent surgical history.     Family History  Problem Relation Age of Onset   Hypertension Mother     Social History   Tobacco Use   Smoking status: Every Day    Packs/day: 0.50    Types: Cigarettes   Smokeless tobacco: Never  Vaping Use   Vaping Use: Never used  Substance Use Topics   Alcohol use: Yes    Comment: BAC not available   Drug use: Yes    Types: Marijuana    Comment: Last used: 2  months ago UDS NA    Home Medications Prior to Admission medications   Medication Sig Start Date End Date Taking? Authorizing Provider  ibuprofen (ADVIL) 600 MG tablet Take 1 tablet (600 mg total) by mouth every 6 (six) hours as needed. 03/15/21   Linwood DibblesKnapp, Jon, MD  risperiDONE (RISPERDAL) 1 MG tablet Take 1 tablet (1 mg total) by mouth at bedtime. 08/30/21   Garlon HatchetSanders, Lisa M, PA-C  triamcinolone cream (KENALOG) 0.1 % Apply 1 application topically 2 (two) times daily. 05/16/21   Palumbo, April, MD  amantadine (SYMMETREL) 100 MG capsule Take 1 capsule (100 mg total) by mouth 2 (two) times daily. Patient not taking: Reported on 11/04/2019 09/01/19 11/17/19  Chales AbrahamsMills, Shnese E, NP    Allergies    Haldol [haloperidol]  Review of Systems   Review of Systems  Constitutional:  Negative for fever.  Musculoskeletal:  Positive for arthralgias.  Skin:  Negative for wound.  Neurological:  Negative for weakness.   Physical Exam Updated Vital Signs BP 129/80   Pulse 74   Temp 98 F (36.7 C) (Oral)   Resp 16   SpO2 91%   Physical Exam Vitals and nursing note reviewed.  Constitutional:      General: He is not in acute distress.    Appearance: Normal appearance. He is not ill-appearing.  HENT:  Head: Normocephalic and atraumatic.     Nose: Nose normal.  Eyes:     Conjunctiva/sclera: Conjunctivae normal.  Pulmonary:     Effort: Pulmonary effort is normal. No respiratory distress.  Musculoskeletal:        General: No deformity.     Comments: Patient without visible swelling or deformity to his left elbow, forearm, wrist, or hand.  Patient with full range of motion in his left upper extremity.  Patient without tenderness to palpation over his left elbow, forearm, wrist, or hand.  Patient with 2+ radial and ulnar pulse in the left upper extremity.  Patient with good sensation intact sensation in left upper extremity that is symmetrical to the right upper extremity.  Skin:    Findings: No rash.   Neurological:     Mental Status: He is alert.    ED Results / Procedures / Treatments   Labs (all labs ordered are listed, but only abnormal results are displayed) Labs Reviewed - No data to display  EKG None  Radiology DG Wrist Complete Left  Result Date: 09/27/2021 CLINICAL DATA:  Pain/swelling EXAM: LEFT WRIST - COMPLETE 3+ VIEW COMPARISON:  None. FINDINGS: No fracture or dislocation is seen. The joint spaces are preserved. Mild dorsal/medial soft tissue swelling. IMPRESSION: No fracture or dislocation is seen. Mild soft tissue swelling. Electronically Signed   By: Charline Bills M.D.   On: 09/27/2021 03:22    Procedures Procedures   Medications Ordered in ED Medications  ibuprofen (ADVIL) tablet 400 mg (has no administration in time range)  ibuprofen (ADVIL) tablet 400 mg (400 mg Oral Given 09/27/21 0228)    ED Course  I have reviewed the triage vital signs and the nursing notes.  Pertinent labs & imaging results that were available during my care of the patient were reviewed by me and considered in my medical decision making (see chart for details).    MDM Rules/Calculators/A&P                           Patient presents to the ED with left arm pain.  This is recurrent.  He denies any new injury.  Patient requesting prescription pain medicine.  No concerning exam findings.  Patient given Motrin.  Patient is appropriate for discharge.  Final Clinical Impression(s) / ED Diagnoses Final diagnoses:  Left arm pain    Rx / DC Orders ED Discharge Orders     None        Marita Kansas, PA-C 09/27/21 6294    Rozelle Logan, DO 09/28/21 [phone]

## 2021-09-27 NOTE — ED Triage Notes (Signed)
Patient reports left arm pain today , denies injury .

## 2021-09-27 NOTE — Discharge Instructions (Signed)
Your x-ray today was negative for fracture or any other acute injury.  He received Motrin with good relief of your pain.  You can continue to take Tylenol.  Return to the emergency department if you develop fever worsening and swelling.

## 2021-09-28 ENCOUNTER — Encounter (HOSPITAL_COMMUNITY): Payer: Self-pay | Admitting: Emergency Medicine

## 2021-09-28 ENCOUNTER — Other Ambulatory Visit: Payer: Self-pay

## 2021-09-28 ENCOUNTER — Emergency Department (HOSPITAL_COMMUNITY)
Admission: EM | Admit: 2021-09-28 | Discharge: 2021-09-28 | Disposition: A | Discharge status: Home / Self Care | Payer: Self-pay | Attending: Emergency Medicine | Admitting: Emergency Medicine

## 2021-09-28 DIAGNOSIS — Z8616 Personal history of COVID-19: Secondary | ICD-10-CM | POA: Insufficient documentation

## 2021-09-28 DIAGNOSIS — F17210 Nicotine dependence, cigarettes, uncomplicated: Secondary | ICD-10-CM | POA: Insufficient documentation

## 2021-09-28 DIAGNOSIS — Z760 Encounter for issue of repeat prescription: Secondary | ICD-10-CM | POA: Insufficient documentation

## 2021-09-28 MED ORDER — RISPERIDONE 1 MG PO TABS
1.0000 mg | ORAL_TABLET | Freq: Once | ORAL | Status: AC
  Administered 2021-09-28: 1 mg via ORAL
  Filled 2021-09-28: qty 1

## 2021-09-28 NOTE — ED Triage Notes (Signed)
Patient here for his daily dose of Risperidal.

## 2021-09-28 NOTE — Discharge Instructions (Signed)
Thank you for allowing me to care for you today in the Emergency Department.   You were given your nighttime dose of your home Risperdal.  Follow up with the [facility].  Return to the ER as needed.

## 2021-09-28 NOTE — ED Provider Notes (Signed)
[name] EMERGENCY DEPARTMENT Provider Note   CSN: [id number] Arrival date & time: 09/28/21  0129     History Chief Complaint  Patient presents with  . Medication Refill    Erminio Nygard is a 35 y.o. male with a history of schizophrenia, homelessness who is well-known to this emergency department with 35 visits over the last 6 months.  He is homeless and cannot afford his medication.  States that he is out of his home Risperdal.  He is requesting his nighttime dose.  He denies SI, HI, or auditory visual hallucinations.  No chest pain, vomiting, fever.  No other complaints.  Staff reports that the patient has been locking himself in the bathroom in the waiting room and sleeping on the floor of the bathroom.  He is seen almost daily requesting medication refill or forearm pain from a fall about a month ago.  The history is provided by the patient and medical records. No language interpreter was used.      Past Medical History:  Diagnosis Date  . COVID-19   . Homelessness   . Hypercholesterolemia   . Mental disorder   . Paranoid behavior (HCC)   . PE (pulmonary embolism)   . Schizophrenia [facility])     Patient Active Problem List   Diagnosis Date Noted  . Acute psychosis (HCC) 08/31/2019  . Schizophrenia (HCC) 08/31/2019  . Schizoaffective disorder, bipolar type (HCC) 01/19/2019  . Alcohol abuse   . Auditory hallucinations   . Alcohol abuse with alcohol-induced mood disorder (HCC) 09/08/2016  . Homelessness 09/03/2016  . Person feigning illness 09/03/2016  . 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 Cannabis use disorder, mild, abuse 08/29/2016  . Pulmonary emboli (HCC) 07/13/2016  . Adjustment disorder with emotional disturbance 03/12/2014  . Pulmonary embolism and infarction (HCC) 01/02/2014  . Acute left flank pain 01/02/2014  . Pulmonary embolism, bilateral (HCC) 01/02/2014    History reviewed. No pertinent surgical  history.     Family History  Problem Relation Age of Onset  . Hypertension Mother     Social History   Tobacco Use  . Smoking status: Every Day    Packs/day: 0.50    Types: Cigarettes  . Smokeless tobacco: Never  Vaping Use  . Vaping Use: Never used  Substance Use Topics  . Alcohol use: Yes    Comment: BAC not available  . Drug use: Yes    Types: Marijuana    Comment: Last used: 2 months ago UDS NA    Home Medications Prior to Admission medications   Medication Sig Start Date End Date Taking? Authorizing Provider  ibuprofen (ADVIL) 600 MG tablet Take 1 tablet (600 mg total) by mouth every 6 (six) hours as needed. 03/15/21   Linwood Dibbles, MD  risperiDONE (RISPERDAL) 1 MG tablet Take 1 tablet (1 mg total) by mouth at bedtime. 08/30/21   Garlon Hatchet, PA-C  triamcinolone cream (KENALOG) 0.1 % Apply 1 application topically 2 (two) times daily. 05/16/21   Palumbo, April, MD  amantadine (SYMMETREL) 100 MG capsule Take 1 capsule (100 mg total) by mouth 2 (two) times daily. Patient not taking: Reported on 11/04/2019 09/01/19 11/17/19  [name], NP    Allergies    Haldol [haloperidol]  Review of Systems   Review of Systems  Constitutional:  Negative for activity change and fever.  Respiratory:  Negative for shortness of breath.   Cardiovascular:  Negative for chest pain.  Gastrointestinal:  Negative for abdominal  pain.  Musculoskeletal:  Negative for back pain.  Skin:  Negative for rash.  Psychiatric/Behavioral:  Negative for hallucinations and suicidal ideas.    Physical Exam Updated Vital Signs BP 121/75 (BP Location: Right Arm)   Pulse 64   Temp 98.4 F (36.9 C) (Oral)   Resp 17   SpO2 99%   Physical Exam Vitals and nursing note reviewed.  Constitutional:      Appearance: He is well-developed.  HENT:     Head: Normocephalic.  Eyes:     Conjunctiva/sclera: Conjunctivae normal.  Cardiovascular:     Rate and Rhythm: Normal rate and regular rhythm.      Heart sounds: No murmur heard. Pulmonary:     Effort: Pulmonary effort is normal. No respiratory distress.     Breath sounds: No stridor. No wheezing, rhonchi or rales.  Chest:     Chest wall: No tenderness.  Abdominal:     General: There is no distension.     Palpations: Abdomen is soft.  Musculoskeletal:     Cervical back: Neck supple.  Skin:    General: Skin is warm and dry.  Neurological:     Mental Status: He is alert.  Psychiatric:        Attention and Perception: He does not perceive auditory or visual hallucinations.        Behavior: Behavior normal.        Thought Content: Thought content does not include homicidal or suicidal ideation. Thought content does not include homicidal or suicidal plan.    ED Results / Procedures / Treatments   Labs (all labs ordered are listed, but only abnormal results are displayed) Labs Reviewed - No data to display  EKG None  Radiology DG Wrist Complete Left  Result Date: 09/27/2021 CLINICAL DATA:  Pain/swelling EXAM: LEFT WRIST - COMPLETE 3+ VIEW COMPARISON:  None. FINDINGS: No fracture or dislocation is seen. The joint spaces are preserved. Mild dorsal/medial soft tissue swelling. IMPRESSION: No fracture or dislocation is seen. Mild soft tissue swelling. Electronically Signed   By: Julian Hy M.D.   On: 09/27/2021 03:22    Procedures Procedures   Medications Ordered in ED Medications  risperiDONE (RISPERDAL) tablet 1 mg (1 mg Oral Given 09/28/21 0431)    ED Course  I have reviewed the triage vital signs and the nursing notes.  Pertinent labs & imaging results that were available during my care of the patient were reviewed by me and considered in my medical decision making (see chart for details).    MDM Rules/Calculators/A&P                           36 year old male with a history of homelessness and schizophrenia is well-known to this emergency department with 35 visits over the last 6 months.  He is requesting  his nighttime dose of Risperdal.  He is unable to afford his medication refills.  He has no SI, HI, or auditory visual hallucinations.  Doubt psychosis at this time.  This patient is well-known to me and I have evaluated him many times.  He is at his baseline today.  Risperdal given.  Encourage follow-up at the [facility].  ER return precautions given.  Safer discharge with outpatient follow-up as discussed.  Final Clinical Impression(s) / ED Diagnoses Final diagnoses:  Medication refill    Rx / DC Orders ED Discharge Orders     None        Barbara Keng  A, PA-C 09/28/21 0522    Merryl Hacker, MD 09/28/21 [phone]

## 2021-09-29 ENCOUNTER — Emergency Department (HOSPITAL_COMMUNITY)
Admission: EM | Admit: 2021-09-29 | Discharge: 2021-09-29 | Disposition: A | Discharge status: Home / Self Care | Payer: Self-pay | Attending: Emergency Medicine | Admitting: Emergency Medicine

## 2021-09-29 ENCOUNTER — Encounter (HOSPITAL_COMMUNITY): Payer: Self-pay | Admitting: Emergency Medicine

## 2021-09-29 ENCOUNTER — Other Ambulatory Visit: Payer: Self-pay

## 2021-09-29 DIAGNOSIS — Z760 Encounter for issue of repeat prescription: Secondary | ICD-10-CM | POA: Insufficient documentation

## 2021-09-29 DIAGNOSIS — R443 Hallucinations, unspecified: Secondary | ICD-10-CM | POA: Insufficient documentation

## 2021-09-29 DIAGNOSIS — F17210 Nicotine dependence, cigarettes, uncomplicated: Secondary | ICD-10-CM | POA: Insufficient documentation

## 2021-09-29 DIAGNOSIS — Z8616 Personal history of COVID-19: Secondary | ICD-10-CM | POA: Insufficient documentation

## 2021-09-29 DIAGNOSIS — Z79899 Other long term (current) drug therapy: Secondary | ICD-10-CM

## 2021-09-29 MED ORDER — RISPERIDONE 1 MG PO TABS
1.0000 mg | ORAL_TABLET | Freq: Once | ORAL | Status: AC
  Administered 2021-09-29: 1 mg via ORAL
  Filled 2021-09-29 (×2): qty 1

## 2021-09-29 NOTE — ED Provider Notes (Signed)
[facility] EMERGENCY DEPARTMENT Provider Note   CSN: [id number] Arrival date & time: 09/29/21  0998     History Chief Complaint  Patient presents with   Medication Refill    Cole Barrett is a 35 y.o. male.  Patient here with chief complaint of needing a dose of his nighttime Risperdal.  States that he is unable to get his regular prescription filled.  Reports having hallucinations.  Denies any other complaints.  The history is provided by the patient. No language interpreter was used.      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 No pertinent surgical history.     Family History  Problem Relation Age of Onset   Hypertension Mother     Social History   Tobacco Use   Smoking status: Every Day    Packs/day: 0.50    Types: Cigarettes   Smokeless tobacco: Never  Vaping Use   Vaping Use: Never used  Substance Use Topics   Alcohol use: Yes    Comment: BAC not available   Drug use: Yes    Types: Marijuana    Comment: Last used: 2 months ago UDS NA    Home Medications Prior to Admission  medications   Medication Sig Start Date End Date Taking? Authorizing Provider  ibuprofen (ADVIL) 600 MG tablet Take 1 tablet (600 mg total) by mouth every 6 (six) hours as needed. 03/15/21   Linwood Dibbles, MD  risperiDONE (RISPERDAL) 1 MG tablet Take 1 tablet (1 mg total) by mouth at bedtime. 08/30/21   Garlon Hatchet, PA-C  triamcinolone cream (KENALOG) 0.1 % Apply 1 application topically 2 (two) times daily. 05/16/21   Palumbo, April, MD  amantadine (SYMMETREL) 100 MG capsule Take 1 capsule (100 mg total) by mouth 2 (two) times daily. Patient not taking: Reported on 11/04/2019 09/01/19 11/17/19  [name], NP    Allergies    Haldol [haloperidol]  Review of Systems   Review of Systems  All other systems reviewed and are negative.  Physical Exam Updated Vital Signs BP 134/80 (BP Location: Right Arm)   Pulse 89   Temp 97.9 F (36.6 C) (Oral)   Resp 18   SpO2 99%   Physical Exam Vitals and nursing note reviewed.  Constitutional:      General: He is not in acute distress.    Appearance: He is well-developed. He is not ill-appearing.  HENT:     Head: Normocephalic and atraumatic.  Eyes:     Conjunctiva/sclera: Conjunctivae normal.  Cardiovascular:     Rate and Rhythm: Normal rate.  Pulmonary:  Effort: Pulmonary effort is normal. No respiratory distress.  Abdominal:     General: There is no distension.  Musculoskeletal:     Cervical back: Neck supple.     Comments: Moves all extremities  Skin:    General: Skin is warm and dry.  Neurological:     Mental Status: He is alert and oriented to person, place, and time.  Psychiatric:        Mood and Affect: Mood normal.        Behavior: Behavior normal.    ED Results / Procedures / Treatments   Labs (all labs ordered are listed, but only abnormal results are displayed) Labs Reviewed - No data to display  EKG None  Radiology No results found.  Procedures Procedures   Medications Ordered in ED Medications   risperiDONE (RISPERDAL) tablet 1 mg (has no administration in time range)    ED Course  I have reviewed the triage vital signs and the nursing notes.  Pertinent labs & imaging results that were available during my care of the patient were reviewed by me and considered in my medical decision making (see chart for details).    MDM Rules/Calculators/A&P                           Medication administration. Final Clinical Impression(s) / ED Diagnoses Final diagnoses:  Medication management    Rx / DC Orders ED Discharge Orders     None        Montine Circle, PA-C 09/29/21 [place], April, MD 09/29/21 [phone]

## 2021-09-29 NOTE — ED Triage Notes (Signed)
Pt reports that he needs his medication.  Pt needs his Risperdal.

## 2021-10-08 DIAGNOSIS — Z8616 Personal history of COVID-19: Secondary | ICD-10-CM | POA: Insufficient documentation

## 2021-10-08 DIAGNOSIS — F17210 Nicotine dependence, cigarettes, uncomplicated: Secondary | ICD-10-CM | POA: Insufficient documentation

## 2021-10-08 DIAGNOSIS — M79602 Pain in left arm: Secondary | ICD-10-CM | POA: Insufficient documentation

## 2021-10-09 ENCOUNTER — Other Ambulatory Visit: Payer: Self-pay

## 2021-10-09 ENCOUNTER — Emergency Department (HOSPITAL_COMMUNITY)
Admission: EM | Admit: 2021-10-09 | Discharge: 2021-10-09 | Disposition: A | Discharge status: Home / Self Care | Payer: Self-pay | Attending: Emergency Medicine | Admitting: Emergency Medicine

## 2021-10-09 ENCOUNTER — Encounter (HOSPITAL_COMMUNITY): Payer: Self-pay | Admitting: Emergency Medicine

## 2021-10-09 DIAGNOSIS — M79602 Pain in left arm: Secondary | ICD-10-CM

## 2021-10-09 MED ORDER — IBUPROFEN 200 MG PO TABS
400.0000 mg | ORAL_TABLET | Freq: Once | ORAL | Status: AC | PRN
  Administered 2021-10-09: 400 mg via ORAL
  Filled 2021-10-09: qty 2

## 2021-10-09 NOTE — ED Provider Notes (Signed)
[facility]-EMERGENCY DEPT Provider Note   CSN: [id number] Arrival date & time: 10/08/21  2345     History Chief Complaint  Patient presents with   Arm Pain    Cole Barrett is a 35 y.o. male.  HPI Patient is a 35 year old male presented to the ER today requesting ibuprofen  He states that he has some arm pain but has resolved since he was given ibuprofen 2 hours ago in the waiting room.     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 No pertinent surgical history.     Family History  Problem Relation Age of Onset   Hypertension Mother     Social History   Tobacco Use   Smoking status: Every Day    Packs/day: 0.50    Types: Cigarettes   Smokeless tobacco: Never  Vaping Use   Vaping Use: Never used  Substance Use Topics   Alcohol use: Yes    Comment: BAC not available   Drug use: Yes    Types: Marijuana    Comment: Last used: 2 months ago UDS NA    Home Medications Prior to Admission medications   Medication Sig Start Date End Date Taking? Authorizing Provider  ibuprofen (ADVIL)  600 MG tablet Take 1 tablet (600 mg total) by mouth every 6 (six) hours as needed. 03/15/21   Linwood Dibbles, MD  risperiDONE (RISPERDAL) 1 MG tablet Take 1 tablet (1 mg total) by mouth at bedtime. 08/30/21   Garlon Hatchet, PA-C  triamcinolone cream (KENALOG) 0.1 % Apply 1 application topically 2 (two) times daily. 05/16/21   Palumbo, April, MD  amantadine (SYMMETREL) 100 MG capsule Take 1 capsule (100 mg total) by mouth 2 (two) times daily. Patient not taking: Reported on 11/04/2019 09/01/19 11/17/19  [name], NP    Allergies    Haldol [haloperidol]  Review of Systems   Review of Systems  Constitutional:  Negative for chills and fever.  HENT:  Negative for congestion.   Respiratory:  Negative for shortness of breath.   Cardiovascular:  Negative for chest pain.  Gastrointestinal:  Negative for abdominal pain.  Musculoskeletal:  Negative for neck pain.       Left arm pain   Physical Exam Updated Vital Signs BP 139/85 (BP Location: Right Arm)   Pulse (!) 104   Temp 98.3 F (36.8 C) (Oral)   Resp 15   Ht 5\' 10"  (1.778 m)   Wt 108.9 kg   SpO2 97%   BMI 34.44 kg/m   Physical Exam Vitals and nursing note reviewed.  Constitutional:  General: He is not in acute distress.    Appearance: Normal appearance. He is not ill-appearing.  HENT:     Head: Normocephalic and atraumatic.  Eyes:     General: No scleral icterus.       Right eye: No discharge.        Left eye: No discharge.     Conjunctiva/sclera: Conjunctivae normal.  Pulmonary:     Effort: Pulmonary effort is normal.     Breath sounds: No stridor.  Musculoskeletal:     Comments: Moves all 4 extremities.  Palpable radial pulses in bilateral upper extremities.  Sensation intact in all 4 extremities.  Neurological:     Mental Status: He is alert and oriented to person, place, and time. Mental status is at baseline.    ED Results / Procedures / Treatments   Labs (all labs ordered are listed, but only abnormal  results are displayed) Labs Reviewed - No data to display  EKG None  Radiology No results found.  Procedures Procedures   Medications Ordered in ED Medications  ibuprofen (ADVIL) tablet 400 mg (400 mg Oral Given 10/09/21 0127)    ED Course  I have reviewed the triage vital signs and the nursing notes.  Pertinent labs & imaging results that were available during my care of the patient were reviewed by me and considered in my medical decision making (see chart for details).    MDM Rules/Calculators/A&P                          Patient here for left arm pain which is now resolved she was given ibuprofen in the waiting room.  He denies any symptoms he is distally neurovascularly intact will discharge home.   Final Clinical Impression(s) / ED Diagnoses Final diagnoses:  Left arm pain    Rx / DC Orders ED Discharge Orders     None        Tedd Sias, [place] 10/09/21 [place], April, MD 10/09/21 2327

## 2021-10-09 NOTE — ED Triage Notes (Signed)
Patient requesting medication for his arm pain. Patient states he hurt his arm at work about 6 months ago and has been getting "pain pills" for it.

## 2021-10-13 ENCOUNTER — Emergency Department (HOSPITAL_COMMUNITY)
Admission: EM | Admit: 2021-10-13 | Discharge: 2021-10-13 | Disposition: A | Discharge status: Home / Self Care | Payer: Self-pay | Attending: Emergency Medicine | Admitting: Emergency Medicine

## 2021-10-13 ENCOUNTER — Other Ambulatory Visit: Payer: Self-pay

## 2021-10-13 DIAGNOSIS — F17210 Nicotine dependence, cigarettes, uncomplicated: Secondary | ICD-10-CM | POA: Insufficient documentation

## 2021-10-13 DIAGNOSIS — Z8616 Personal history of COVID-19: Secondary | ICD-10-CM | POA: Insufficient documentation

## 2021-10-13 DIAGNOSIS — R443 Hallucinations, unspecified: Secondary | ICD-10-CM | POA: Insufficient documentation

## 2021-10-13 MED ORDER — RISPERIDONE 1 MG PO TABS
1.0000 mg | ORAL_TABLET | Freq: Once | ORAL | Status: AC
  Administered 2021-10-13: 1 mg via ORAL
  Filled 2021-10-13: qty 1

## 2021-10-13 NOTE — ED Triage Notes (Signed)
Pt reports he is hearing voices. Denies SI/HI.

## 2021-10-13 NOTE — ED Notes (Signed)
Pt discharged and ambulated out of the ED without difficulty. 

## 2021-10-13 NOTE — ED Provider Notes (Signed)
[facility] EMERGENCY DEPARTMENT Provider Note   CSN: [id number] Arrival date & time: 10/13/21  0405     History Chief Complaint  Patient presents with   Hallucinations    Cole Barrett is a 35 y.o. male.  35 year old male who presents emerged from today secondary to need for medication.  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  Denies any suicidal homicidal ideation.  Denies any command hallucinations.       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     Family History  Problem Relation Age of Onset   Hypertension Mother     Social History   Tobacco Use   Smoking status: Every Day    Packs/day: 0.50    Types: Cigarettes   Smokeless tobacco: Never  Vaping Use   Vaping Use: Never used  Substance Use Topics   Alcohol use: Yes    Comment: BAC not available   Drug use: Yes    Types: Marijuana    Comment: Last used: 2 months ago UDS NA    Home Medications Prior to Admission medications    Medication Sig Start Date End Date Taking? Authorizing Provider  ibuprofen (ADVIL) 600 MG tablet Take 1 tablet (600 mg total) by mouth every 6 (six) hours as needed. 03/15/21   Linwood Dibbles, MD  risperiDONE (RISPERDAL) 1 MG tablet Take 1 tablet (1 mg total) by mouth at bedtime. 08/30/21   Garlon Hatchet, PA-C  triamcinolone cream (KENALOG) 0.1 % Apply 1 application topically 2 (two) times daily. 05/16/21   Palumbo, April, MD  amantadine (SYMMETREL) 100 MG capsule Take 1 capsule (100 mg total) by mouth 2 (two) times daily. Patient not taking: Reported on 11/04/2019 09/01/19 11/17/19  [name], NP    Allergies    Haldol [haloperidol]  Review of Systems   Review of Systems  All other systems reviewed and are negative.  Physical Exam Updated Vital Signs BP 133/71   Pulse 98   Resp 16   Ht 5\' 10"  (1.778 m)   Wt 108 kg   SpO2 97%   BMI 34.16 kg/m   Physical Exam Vitals and nursing note reviewed.  Constitutional:      Appearance: He is well-developed.  HENT:     Head: Normocephalic and atraumatic.     Mouth/Throat:     Mouth: Mucous membranes are moist.     Pharynx: Oropharynx is clear.  Eyes:     Pupils: Pupils are equal, round, and reactive  to light.  Cardiovascular:     Rate and Rhythm: Normal rate.  Pulmonary:     Effort: Pulmonary effort is normal. No respiratory distress.  Abdominal:     General: Abdomen is flat. There is no distension.  Musculoskeletal:        General: No swelling or tenderness. Normal range of motion.     Cervical back: Normal range of motion.  Skin:    General: Skin is warm and dry.  Neurological:     General: No focal deficit present.     Mental Status: He is alert.    ED Results / Procedures / Treatments   Labs (all labs ordered are listed, but only abnormal results are displayed) Labs Reviewed - No data to display  EKG None  Radiology No results found.  Procedures Procedures   Medications Ordered in ED Medications   risperiDONE (RISPERDAL) tablet 1 mg (1 mg Oral Given 10/13/21 0502)    ED Course  I have reviewed the triage vital signs and the nursing notes.  Pertinent labs & imaging results that were available during my care of the patient were reviewed by me and considered in my medical decision making (see chart for details).    MDM Rules/Calculators/A&P                         Patient is very well-known to this department and I have seen and myself multiple times as well.  He is calm, interactive and seems to be at his psychiatric baseline and low risk of harm to self/others at this time.  We will go and provide him his medications but once again reaffirmed that he needs to be getting from his primary doctor.   Final Clinical Impression(s) / ED Diagnoses Final diagnoses:  Hallucinations    Rx / DC Orders ED Discharge Orders     None        Jhene Westmoreland, Corene Cornea, MD 10/13/21 [phone]

## 2021-10-14 ENCOUNTER — Encounter (HOSPITAL_COMMUNITY): Payer: Self-pay | Admitting: Emergency Medicine

## 2021-10-14 ENCOUNTER — Other Ambulatory Visit: Payer: Self-pay

## 2021-10-14 ENCOUNTER — Emergency Department (HOSPITAL_COMMUNITY)
Admission: EM | Admit: 2021-10-14 | Discharge: 2021-10-14 | Disposition: A | Discharge status: Home / Self Care | Payer: Self-pay | Attending: Emergency Medicine | Admitting: Emergency Medicine

## 2021-10-14 DIAGNOSIS — Z8616 Personal history of COVID-19: Secondary | ICD-10-CM | POA: Insufficient documentation

## 2021-10-14 DIAGNOSIS — F17210 Nicotine dependence, cigarettes, uncomplicated: Secondary | ICD-10-CM | POA: Insufficient documentation

## 2021-10-14 DIAGNOSIS — R440 Auditory hallucinations: Secondary | ICD-10-CM | POA: Insufficient documentation

## 2021-10-14 DIAGNOSIS — Z760 Encounter for issue of repeat prescription: Secondary | ICD-10-CM | POA: Insufficient documentation

## 2021-10-14 MED ORDER — RISPERIDONE 1 MG PO TABS
1.0000 mg | ORAL_TABLET | Freq: Once | ORAL | Status: AC
  Administered 2021-10-14: 06:00:00 1 mg via ORAL
  Filled 2021-10-14: qty 1

## 2021-10-14 NOTE — ED Provider Notes (Signed)
[name] Provider Note   CSN: [id number] Arrival date & time: 10/14/21  0441     History Chief Complaint  Patient presents with   Medication Refill    Cole Barrett is a 35 y.o. male.  Patient is a 35 year old male with past medical history of paranoid schizophrenia, homelessness.  Patient presenting today for evaluation of hearing voices and requesting a dose of his Risperdal.  Patient tells me he has this medication at home, but is unable to get there to take the medication.  He denies any suicidal or homicidal ideation.  The history is provided by the patient.      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 No pertinent surgical history.     Family History  Problem Relation Age of Onset   Hypertension Mother     Social History   Tobacco Use   Smoking status: Every Day    Packs/day: 0.50    Types: Cigarettes   Smokeless tobacco: Never  Vaping Use   Vaping Use: Never used  Substance Use Topics   Alcohol use: Yes    Comment: BAC not available   Drug use: Yes     Types: Marijuana    Comment: Last used: 2 months ago UDS NA    Home Medications Prior to Admission medications   Medication Sig Start Date End Date Taking? Authorizing Provider  ibuprofen (ADVIL) 600 MG tablet Take 1 tablet (600 mg total) by mouth every 6 (six) hours as needed. 03/15/21   Linwood Dibbles, MD  risperiDONE (RISPERDAL) 1 MG tablet Take 1 tablet (1 mg total) by mouth at bedtime. 08/30/21   Garlon Hatchet, PA-C  triamcinolone cream (KENALOG) 0.1 % Apply 1 application topically 2 (two) times daily. 05/16/21   Palumbo, April, MD  amantadine (SYMMETREL) 100 MG capsule Take 1 capsule (100 mg total) by mouth 2 (two) times daily. Patient not taking: Reported on 11/04/2019 09/01/19 11/17/19  [name], NP    Allergies    Haldol [haloperidol]  Review of Systems   Review of Systems  All other systems reviewed and are negative.  Physical Exam Updated Vital Signs BP 116/72   Pulse 74   Temp 98.6 F (37 C) (Oral)   Resp 15   Ht 5\' 10"  (1.778 m)   Wt 108 kg   SpO2 100%   BMI 34.16 kg/m   Physical Exam Vitals and nursing note reviewed.  Constitutional:      General: He is not in acute distress.    Appearance: Normal appearance.  HENT:     Head: Normocephalic and atraumatic.  Pulmonary:     Effort: Pulmonary effort is normal.  Skin:    General: Skin is warm and dry.  Neurological:     Mental Status: He is alert.  Psychiatric:        Attention and Perception: Attention normal. He perceives auditory hallucinations.        Mood and Affect: Mood normal.        Speech: Speech normal.        Behavior: Behavior normal.    ED Results / Procedures / Treatments   Labs (all labs ordered are listed, but only abnormal results are displayed) Labs Reviewed - No data to display  EKG None  Radiology No results found.  Procedures Procedures   Medications Ordered in ED Medications  risperiDONE (RISPERDAL) tablet 1 mg (has no administration in time range)    ED Course   I have reviewed the triage vital signs and the nursing notes.  Pertinent labs & imaging results that were available during my care of the patient were reviewed by me and considered in my medical decision making (see chart for details).    MDM Rules/Calculators/A&P  Patient presenting with complaints of hearing voices and requesting a dose of Risperdal.  He is unable to take his home medication this evening because he is unable to make it home before the dose is due.  Patient is well-known to the emergency department.  I have seen him many times in the past and he seems to be at his baseline.  I feel comfortable giving a dose of Risperdal, but see no indication for psychiatric consultation or admission.  Final Clinical Impression(s) / ED Diagnoses Final diagnoses:  None    Rx / DC Orders ED Discharge Orders     None        Veryl Speak, MD 10/14/21 [phone]

## 2021-10-14 NOTE — Discharge Instructions (Signed)
Continue medications as previously prescribed.  Follow-up with your psychiatrist/primary doctor as scheduled.

## 2021-10-14 NOTE — ED Triage Notes (Signed)
Patient wants a dose of his Risperdal.

## 2021-10-15 ENCOUNTER — Encounter (HOSPITAL_COMMUNITY): Payer: Self-pay | Admitting: Emergency Medicine

## 2021-10-15 ENCOUNTER — Ambulatory Visit (HOSPITAL_COMMUNITY)
Admission: EM | Admit: 2021-10-15 | Discharge: 2021-10-15 | Disposition: A | Discharge status: Home / Self Care | Payer: No Payment, Other | Attending: Nurse Practitioner | Admitting: Nurse Practitioner

## 2021-10-15 ENCOUNTER — Other Ambulatory Visit: Payer: Self-pay

## 2021-10-15 ENCOUNTER — Emergency Department (HOSPITAL_COMMUNITY)
Admission: EM | Admit: 2021-10-15 | Discharge: 2021-10-15 | Disposition: A | Discharge status: Home / Self Care | Payer: Self-pay | Attending: Emergency Medicine | Admitting: Emergency Medicine

## 2021-10-15 DIAGNOSIS — F32A Depression, unspecified: Secondary | ICD-10-CM | POA: Insufficient documentation

## 2021-10-15 DIAGNOSIS — Z8616 Personal history of COVID-19: Secondary | ICD-10-CM | POA: Insufficient documentation

## 2021-10-15 DIAGNOSIS — F17210 Nicotine dependence, cigarettes, uncomplicated: Secondary | ICD-10-CM | POA: Insufficient documentation

## 2021-10-15 DIAGNOSIS — Z760 Encounter for issue of repeat prescription: Secondary | ICD-10-CM | POA: Insufficient documentation

## 2021-10-15 DIAGNOSIS — Z79899 Other long term (current) drug therapy: Secondary | ICD-10-CM | POA: Insufficient documentation

## 2021-10-15 DIAGNOSIS — R440 Auditory hallucinations: Secondary | ICD-10-CM | POA: Insufficient documentation

## 2021-10-15 DIAGNOSIS — R443 Hallucinations, unspecified: Secondary | ICD-10-CM | POA: Insufficient documentation

## 2021-10-15 MED ORDER — RISPERIDONE 0.25 MG PO TABS
0.2500 mg | ORAL_TABLET | Freq: Once | ORAL | Status: AC
  Administered 2021-10-15: 0.25 mg via ORAL
  Filled 2021-10-15: qty 1

## 2021-10-15 MED ORDER — RISPERIDONE 1 MG PO TABS
1.0000 mg | ORAL_TABLET | Freq: Once | ORAL | Status: AC
  Administered 2021-10-15: 1 mg via ORAL
  Filled 2021-10-15: qty 1

## 2021-10-15 MED ORDER — RISPERIDONE 1 MG PO TABS: 1.0000 mg | ORAL_TABLET | Freq: Every day | ORAL | 0 refills | Status: DC

## 2021-10-15 NOTE — ED Provider Notes (Signed)
[facility]al [facility] Medical Screening Exam  Patient Name: Cole Barrett MRN: [id number] Date of Evaluation: 10/15/21 Chief Complaint:   Diagnosis:  Final diagnoses:  Auditory hallucinations  Medication management    History of Present illness: Cole Barrett is a 35 y.o. male who presents to [facility] voluntarily due to [facility]. Patient is well known to behavioral health services and area emergency departments. Presentation is similar to previous presentations. Patient is endorses ongoing auditory hallucination; he states "I'm hearing voices that are telling me they are going to kill me." He states that the voices are louder than usual and are interfering with his life. He reports that he takes his medication as prescribed. He reports "I have a bottle of my medication, Risperdal at home." Review of record shows that patient has had multiple/frequent visits to the ED (almost everyday) to request his daily dose of Risperdal.   Reviewed TTS assessment and validated with patient. On evaluation patient is alert and oriented x 4, pleasant, and cooperative. Speech is clear and coherent. Mood is dysphoric and affect is congruent with mood. Thought process is coherent and thought content is logical. Denies visual hallucinations. No indication that patient is responding to internal stimuli. No evidence of delusional thought content. Denies suicidal ideations. Denies homicidal ideations.   Psychiatric Specialty Exam  Presentation  General Appearance:Appropriate for Environment; Fairly Groomed  Eye Contact:Good  Speech:Clear and Coherent; Normal Rate  Speech Volume:Normal  Handedness:Right   Mood and Affect  Mood:Dysphoric  Affect:Appropriate; Congruent   Thought Process  Thought Processes:Coherent  Descriptions of Associations:Intact  Orientation:Full (Time, Place and Person)  Thought Content:Logical  Diagnosis of Schizophrenia or Schizoaffective disorder in past:  Yes  Duration of Psychotic Symptoms: Greater than six months  Hallucinations:Auditory voices tell him that they are going to kill him  Ideas of Reference:None  Suicidal Thoughts:No  Homicidal Thoughts:No   Sensorium  Memory:Immediate Good; Recent Good; Remote Good  Judgment:Fair  Insight:Fair   Executive Functions  Concentration:Good  Attention Span:Fair  Recall:Good  Fund of Knowledge:Good  Language:Good   Psychomotor Activity  Psychomotor Activity:Normal   Assets  Assets:Communication Skills; Desire for Improvement; Physical Health   Sleep  Sleep:Good  Number of hours: 8   No data recorded  Physical Exam: Physical Exam Constitutional:      General: He is not in acute distress.    Appearance: He is not ill-appearing, toxic-appearing or diaphoretic.  HENT:     Head: Normocephalic.     Right Ear: External ear normal.     Left Ear: External ear normal.  Eyes:     Pupils: Pupils are equal, round, and reactive to light.  Cardiovascular:     Rate and Rhythm: Normal rate.  Pulmonary:     Effort: Pulmonary effort is normal. No respiratory distress.  Musculoskeletal:        General: Normal range of motion.  Skin:    General: Skin is warm and dry.  Neurological:     Mental Status: He is alert and oriented to person, place, and time.  Psychiatric:        Mood and Affect: Mood is depressed.        Speech: Speech normal.        Behavior: Behavior is cooperative.        Thought Content: Thought content is not paranoid or delusional. Thought content does not include homicidal or suicidal ideation. Thought content does not include suicidal plan.   Review of Systems  Constitutional:  Negative  for chills, diaphoresis, fever, malaise/fatigue and weight loss.  HENT:  Negative for congestion.   Respiratory:  Negative for cough and shortness of breath.   Cardiovascular:  Negative for chest pain and palpitations.  Gastrointestinal:  Negative for diarrhea,  nausea and vomiting.  Neurological:  Negative for dizziness and seizures.  Psychiatric/Behavioral:  Positive for depression and hallucinations. Negative for memory loss and suicidal ideas. The patient is not nervous/anxious and does not have insomnia.   All other systems reviewed and are negative.  Blood pressure 126/81, pulse 70, temperature 98.9 F (37.2 C), temperature source Oral, resp. rate 20, SpO2 99 %. There is no height or weight on file to calculate BMI.  Musculoskeletal: Strength & Muscle Tone: within normal limits Gait & Station: normal Patient leans: N/A   [facility] MSE Discharge Disposition for Follow up and Recommendations: Based on my evaluation the patient does not appear to have an emergency medical condition and can be discharged with resources and follow up care in outpatient services for Medication Management and Individual Therapy   Jackelyn Poling, NP 10/15/2021, 4:39 AM

## 2021-10-15 NOTE — Discharge Instructions (Addendum)
°  Discharge recommendations:  °Patient is to take medications as prescribed. °Please see information for follow-up appointment with psychiatry and therapy. °Please follow up with your primary care provider for all medical related needs.  ° °Therapy: We recommend that patient participate in individual therapy to address mental health concerns. ° °Atypical antipsychotics: If you are prescribed an atypical antipsychotic, it is recommended that your height, weight, BMI, blood pressure, fasting lipid panel, and fasting blood sugar be monitored by your outpatient providers. ° °Safety:  °The patient should abstain from use of illicit substances/drugs and abuse of any medications. °If symptoms worsen or do not continue to improve or if the patient becomes actively suicidal or homicidal then it is recommended that the patient return to the closest hospital emergency department, the [name], or call 911 for further evaluation and treatment. °National Suicide Prevention Lifeline 1-800-SUICIDE or 1-800-273-8255. ° °About 988 °988 offers 24/7 access to trained crisis counselors who can help people experiencing mental health-related distress. People can call or text 988 or chat 988lifeline.org for themselves or if they are worried about a loved one who may need crisis support.  ° °__________________________________ °Therapy Walk-in Hours ° °Monday-Wednesday: 8 AM until slots are full ° °Friday: 1 PM to 5 PM ° °For Monday to Wednesday, it is recommended that patients arrive between 7:30 AM and 7:45 AM because patients will be seen in the order of arrival.  For Friday, we ask that patients arrive between 12 PM to 12:30 PM.  Go to the second floor on arrival and check in. ° °**Availability is limited; therefore, patients may not be seen on the same day.** ° °Medication management walk-ins: ° °Monday to Friday: 8 AM to 11 AM. ° °It is recommended that patients arrive by 7:30 AM to 7:45 AM because  patients will be seen in the order of arrival.  Go to the second floor on arrival and check in. ° °**Availability is limited; therefore, patients may not be seen on the same day.**  °

## 2021-10-15 NOTE — ED Triage Notes (Signed)
Patient requesting Risperidal for his auditory hallucinations , no SI or HI.

## 2021-10-15 NOTE — ED Provider Notes (Signed)
[facility] EMERGENCY DEPARTMENT Provider Note   CSN: [id number] Arrival date & time: 10/15/21  0210     History Chief Complaint  Patient presents with   Hallucinations     Cole Barrett is a 35 y.o. male.  The history is provided by the patient and medical records.   35 y.o. M with homelessness, schizophrenia, presenting to the ED requesting his risperdal.  He states he has run out of it now.  He denies SI/HI.  Well known to this facility for same.  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 No pertinent surgical history.     Family History  Problem Relation Age of Onset   Hypertension Mother     Social History   Tobacco Use   Smoking status: Every Day    Packs/day: 0.50    Types: Cigarettes   Smokeless tobacco: Never  Vaping Use   Vaping Use: Never used  Substance Use Topics   Alcohol use: Yes    Comment: BAC not available   Drug use: Yes    Types: Marijuana    Comment: Last used: 2 months ago UDS NA    Home Medications Prior to Admission medications   Medication Sig Start Date  End Date Taking? Authorizing Provider  risperiDONE (RISPERDAL) 1 MG tablet Take 1 tablet (1 mg total) by mouth at bedtime. 10/15/21  Yes Garlon Hatchet, PA-C  ibuprofen (ADVIL) 600 MG tablet Take 1 tablet (600 mg total) by mouth every 6 (six) hours as needed. 03/15/21   Linwood Dibbles, MD  triamcinolone cream (KENALOG) 0.1 % Apply 1 application topically 2 (two) times daily. 05/16/21   Palumbo, April, MD  amantadine (SYMMETREL) 100 MG capsule Take 1 capsule (100 mg total) by mouth 2 (two) times daily. Patient not taking: Reported on 11/04/2019 09/01/19 11/17/19  [name], NP    Allergies    Haldol [haloperidol]  Review of Systems   Review of Systems  Psychiatric/Behavioral:         Medication  All other systems reviewed and are negative.  Physical Exam Updated Vital Signs BP 128/83 (BP Location: Left Arm)   Pulse 71   Temp 97.8 F (36.6 C)   Resp 16   SpO2 97%   Physical Exam Vitals and nursing note reviewed.  Constitutional:      Appearance: He is well-developed.     Comments: Sleeping in chair, awoken for exam  HENT:     Head: Normocephalic and atraumatic.  Eyes:     Conjunctiva/sclera: Conjunctivae normal.     Pupils: Pupils are equal, round, and reactive to light.  Cardiovascular:     Rate and Rhythm: Normal rate and regular rhythm.     Heart sounds: Normal heart sounds.  Pulmonary:     Effort: Pulmonary effort is normal.     Breath sounds: Normal breath sounds.  Abdominal:     General: Bowel sounds are normal.     Palpations: Abdomen is soft.  Musculoskeletal:        General: Normal range of motion.     Cervical back: Normal range of motion.  Skin:    General: Skin is warm and dry.  Neurological:     Mental Status: He is alert and oriented to person, place, and time.  Psychiatric:     Comments: Denies SI/HI    ED Results / Procedures / Treatments   Labs (all labs ordered are listed, but only abnormal results are displayed) Labs Reviewed - No data to  display  EKG None  Radiology No results found.  Procedures Procedures   Medications Ordered in ED Medications  risperiDONE (RISPERDAL) tablet 1 mg (1 mg Oral Given 10/15/21 0254)    ED Course  I have reviewed the triage vital signs and the nursing notes.  Pertinent labs & imaging results that were available during my care of the patient were reviewed by me and considered in my medical decision making (see chart for details).    MDM Rules/Calculators/A&P                           35 y.o. M here requesting medication refill.  Well known to this hospital for same.  Denies SI/HI/  given dose of his risperdal here, refill sent to pharmacy.  Follow-up with ACT team.  Return here for new concerns.  Final Clinical Impression(s) / ED Diagnoses Final diagnoses:  Medication refill    Rx / DC Orders ED Discharge Orders          Ordered    risperiDONE (RISPERDAL) 1 MG tablet  Daily at bedtime        10/15/21 0259             Larene Pickett, PA-C 10/15/21 0301    Ezequiel Essex, MD 10/15/21 [phone]

## 2021-10-15 NOTE — Progress Notes (Signed)
TRIAGE - ROUTINE  Lindon Romp, NP, reviewed pt's chart and information and met with pt face-to-face and determined pt can be psych cleared.   10/15/21 0420  [place] (Walk-ins at [facility] only)  How Did You Hear About [place]? Self  What Is the Reason for Your Visit/Call Today? Pt shares he got a ride to the [facility] due to being away from home and no plan to return home until tomorrow. Pt is requesting his Risperdal medication. Pt states he is hearing voices telling that they're going to kill him. Pt denies SI, HI, VH, NSSIB, access to guns/weapons, engagement with the legal system, or SA.  How Long Has This Been Causing You Problems? <Week  Have You Recently Had Any Thoughts About Hurting Yourself? No  Are You Planning to Commit Suicide/Harm Yourself At This time? No  Have you Recently Had Thoughts About [place]? No  Are You Planning To Harm Someone At This Time? No  Are you currently experiencing any auditory, visual or other hallucinations? Yes  Please explain the hallucinations you are currently experiencing: Pt states he is currently experiencing AH telling him they are going to kill them.  Have You Used Any Alcohol or Drugs in the Past 24 Hours? No  Do you have any current medical co-morbidities that require immediate attention? No  Clinician description of patient physical appearance/behavior: Pt was dressed in a casual and age-appropriate manner. He answered the questions posed.  What Do You Feel Would Help You the Most Today? Medication(s)  If access to [facility] was not available, would you have sought care in the Emergency Department? Yes  Determination of Need Routine (7 days)  Options For Referral Outpatient Therapy;Medication Management

## 2021-10-15 NOTE — Discharge Instructions (Signed)
Refill sent to pharmacy, pick up later today. Follow-up with your ACT team. Return here for new concerns.

## 2021-10-17 ENCOUNTER — Emergency Department (HOSPITAL_COMMUNITY)
Admission: EM | Admit: 2021-10-17 | Discharge: 2021-10-17 | Disposition: A | Discharge status: Home / Self Care | Payer: Self-pay | Attending: Emergency Medicine | Admitting: Emergency Medicine

## 2021-10-17 ENCOUNTER — Other Ambulatory Visit: Payer: Self-pay

## 2021-10-17 ENCOUNTER — Encounter (HOSPITAL_COMMUNITY): Payer: Self-pay

## 2021-10-17 DIAGNOSIS — R440 Auditory hallucinations: Secondary | ICD-10-CM | POA: Insufficient documentation

## 2021-10-17 DIAGNOSIS — Z8616 Personal history of COVID-19: Secondary | ICD-10-CM | POA: Insufficient documentation

## 2021-10-17 DIAGNOSIS — F17210 Nicotine dependence, cigarettes, uncomplicated: Secondary | ICD-10-CM | POA: Insufficient documentation

## 2021-10-17 MED ORDER — RISPERIDONE 1 MG PO TABS
1.0000 mg | ORAL_TABLET | Freq: Once | ORAL | Status: AC
  Administered 2021-10-17: 1 mg via ORAL
  Filled 2021-10-17: qty 1

## 2021-10-17 NOTE — ED Provider Notes (Addendum)
[place] DEPT Provider Note   CSN: [id number] Arrival date & time: 10/17/21  0221     History Chief Complaint  Patient presents with   Psychiatric Evaluation    Hearing voices  & needs Risperdal    Cole Barrett is a 34 y.o. male with a history of schizophrenia who presents to the emergency department initially complaining of auditory hallucinations this evening.  Patient states that he was hearing voices, he states this has resolved at this time, states that he did take his Risperdal, he would now just like somewhere to sleep.  No alleviating or aggravating factors.  He denies SI, HI, chest pain, shortness of breath, abdominal pain, vomiting, or diarrhea.  HPI     Past Medical History:  Diagnosis Date   COVID-19    Homelessness    Hypercholesterolemia    Mental disorder    Paranoid behavior ([place])    PE (pulmonary embolism)    Schizophrenia ([place])     Patient Active Problem List   Diagnosis Date Noted   Acute psychosis ([place]) 08/31/2019   Schizophrenia ([place]) 08/31/2019   Schizoaffective disorder, bipolar type ([place]) 01/19/2019   Alcohol abuse    Auditory hallucinations    Alcohol abuse with alcohol-induced mood disorder ([place]) 09/08/2016   Homelessness 09/03/2016   Person feigning illness 09/03/2016   Brief psychotic disorder ([place]) 08/29/2016   Cannabis use disorder, mild, abuse 08/29/2016   Pulmonary emboli ([place]) 07/13/2016   Adjustment disorder with emotional disturbance 03/12/2014   Pulmonary embolism and infarction ([place]) 01/02/2014   Acute left flank pain 01/02/2014   Pulmonary embolism, bilateral ([place]) 01/02/2014    History reviewed. No pertinent surgical history.     Family History  Problem Relation Age of Onset   Hypertension Mother     Social History   Tobacco Use   Smoking status: Every Day    Packs/day: 0.50    Types: Cigarettes   Smokeless tobacco: Never  Vaping Use   Vaping Use: Never used   Substance Use Topics   Alcohol use: Yes    Comment: BAC not available   Drug use: Yes    Types: Marijuana    Comment: Last used: 2 months ago UDS NA    Home Medications Prior to Admission medications   Medication Sig Start Date End Date Taking? Authorizing Provider  ibuprofen (ADVIL) 600 MG tablet Take 1 tablet (600 mg total) by mouth every 6 (six) hours as needed. 03/15/21   Dorie Rank, MD  risperiDONE (RISPERDAL) 1 MG tablet Take 1 tablet (1 mg total) by mouth at bedtime. 10/15/21   Larene Pickett, PA-C  triamcinolone cream (KENALOG) 0.1 % Apply 1 application topically 2 (two) times daily. 05/16/21   Palumbo, April, MD  amantadine (SYMMETREL) 100 MG capsule Take 1 capsule (100 mg total) by mouth 2 (two) times daily. Patient not taking: Reported on 11/04/2019 09/01/19 11/17/19  Mallie Darting, NP    Allergies    Haldol [haloperidol]  Review of Systems   Review of Systems  Constitutional:  Negative for chills and fever.  Respiratory:  Negative for shortness of breath.   Cardiovascular:  Negative for chest pain.  Gastrointestinal:  Negative for abdominal pain, diarrhea and vomiting.  Psychiatric/Behavioral:  Positive for hallucinations. Negative for suicidal ideas.   All other systems reviewed and are negative.  Physical Exam Updated Vital Signs BP (!) 147/67 (BP Location: Right Arm)   Pulse 90   Temp 97.7 F (36.5 C) (Oral)  Resp 18   Ht 5\' 10"  (1.778 m)   Wt 108 kg   SpO2 96%   BMI 34.16 kg/m   Physical Exam Vitals and nursing note reviewed.  Constitutional:      General: He is not in acute distress.    Appearance: He is well-developed. He is not toxic-appearing.     Comments: Initially sleeping, easily awakened.  HENT:     Head: Normocephalic and atraumatic.  Eyes:     General:        Right eye: No discharge.        Left eye: No discharge.     Conjunctiva/sclera: Conjunctivae normal.  Cardiovascular:     Rate and Rhythm: Normal rate and regular rhythm.   Pulmonary:     Effort: Pulmonary effort is normal. No respiratory distress.     Breath sounds: Normal breath sounds. No wheezing, rhonchi or rales.  Abdominal:     General: There is no distension.     Palpations: Abdomen is soft.     Tenderness: There is no abdominal tenderness.  Musculoskeletal:     Cervical back: Neck supple.  Skin:    General: Skin is warm and dry.     Findings: No rash.  Neurological:     Comments: Clear speech.   Psychiatric:        Behavior: Behavior normal. Behavior is cooperative.        Thought Content: Thought content does not include homicidal or suicidal ideation.    ED Results / Procedures / Treatments   Labs (all labs ordered are listed, but only abnormal results are displayed) Labs Reviewed - No data to display  EKG None  Radiology No results found.  Procedures Procedures   Medications Ordered in ED Medications - No data to display  ED Course  I have reviewed the triage vital signs and the nursing notes.  Pertinent labs & imaging results that were available during my care of the patient were reviewed by me and considered in my medical decision making (see chart for details).    MDM Rules/Calculators/A&P                           Patient presents to the emergency department with initial complaints of auditory hallucinations, on my assessment patient states he is have resolved and that he would just like somewhere to sleep.  He is resting comfortably, vitals notable for mildly elevated blood pressure, doubt hypertensive emergency.  Patient has no additional complaints.  He does not appear to be actively responding to internal stimuli and denies SI or HI.  He is well-known to the emergency department and appears to be at his baseline. He rested in the emergency department for period of time and now appears appropriate for discharge.   Final Clinical Impression(s) / ED Diagnoses Final diagnoses:  Auditory hallucination    Rx / DC  Orders ED Discharge Orders     None        , PA-C 10/17/21 10/19/21  Patient now states he has not had his Risperdal and is requesting his usual dose, no ED visit today prior to this, will provide 1 dose, multiple prior prescriptions have been sent to the pharmacy.     [id number] 10/17/21 10/19/21    1324, MD 10/17/21 [phone]

## 2021-10-17 NOTE — ED Triage Notes (Signed)
Hearing voices  & needs Risperdal

## 2021-10-19 ENCOUNTER — Telehealth (HOSPITAL_COMMUNITY): Payer: Self-pay

## 2021-10-19 ENCOUNTER — Ambulatory Visit (HOSPITAL_COMMUNITY)
Admission: EM | Admit: 2021-10-19 | Discharge: 2021-10-19 | Disposition: A | Discharge status: Home / Self Care | Payer: No Payment, Other | Attending: Nurse Practitioner | Admitting: Nurse Practitioner

## 2021-10-19 DIAGNOSIS — Z79899 Other long term (current) drug therapy: Secondary | ICD-10-CM

## 2021-10-19 DIAGNOSIS — R440 Auditory hallucinations: Secondary | ICD-10-CM | POA: Insufficient documentation

## 2021-10-19 DIAGNOSIS — Z760 Encounter for issue of repeat prescription: Secondary | ICD-10-CM | POA: Insufficient documentation

## 2021-10-19 MED ORDER — RISPERIDONE 1 MG PO TABS
1.0000 mg | ORAL_TABLET | Freq: Once | ORAL | Status: AC
  Administered 2021-10-19: 1 mg via ORAL
  Filled 2021-10-19: qty 1

## 2021-10-19 NOTE — ED Provider Notes (Signed)
[facility]gent [facility] Medical Screening Exam  Patient Name: Cole Barrett MRN: [id number] Date of Evaluation: 10/19/21 Chief Complaint:   Diagnosis:  Final diagnoses:  Auditory hallucinations  Medication management    History of Present illness: Cole Barrett is a 35 y.o. male who presents voluntarily to [facility] due to auditory hallucinations.  Patient is well known to behavioral health services and area emergency departments. 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 He is requesting Risperdal.   On evaluation patient is alert and oriented x 4, pleasant, and cooperative. Speech is clear and coherent. Mood is dysphoric and affect is congruent with mood. Thought process is coherent and thought content is logical. Reports AH of voices that tell him they are going to kill him. Patient denies that the voices are command in nature. Denies VH.No indication that patient is responding to internal stimuli. No evidence of delusional thought content. Denies suicidal ideations. Denies homicidal ideations.   Psychiatric Specialty Exam  Presentation  General Appearance:Fairly Groomed  Eye Contact:Good  Speech:Clear and Coherent; Normal Rate  Speech Volume:Normal  Handedness:Right   Mood and Affect  Mood:Dysphoric  Affect:Congruent; Appropriate   Thought Process  Thought Processes:Coherent  Descriptions of Associations:Intact  Orientation:Full (Time, Place and Person)  Thought Content:Logical  Diagnosis of Schizophrenia or Schizoaffective disorder in past: Yes  Duration of Psychotic Symptoms: Greater than six months  Hallucinations:Auditory voices tell him that they are going to kill him  Ideas of Reference:None  Suicidal Thoughts:No  Homicidal Thoughts:No   Sensorium  Memory:Immediate Good; Recent Good; Remote Good  Judgment:Fair  Insight:Fair   Executive Functions   Concentration:Fair  Attention Span:Fair  Recall:Good  Fund of Knowledge:Good  Language:Good   Psychomotor Activity  Psychomotor Activity:Normal   Assets  Assets:Communication Skills; Desire for Improvement; Physical Health   Sleep  Sleep:Good  Number of hours: 8   No data recorded  Physical Exam: Physical Exam Constitutional:      General: He is not in acute distress.    Appearance: He is not ill-appearing, toxic-appearing or diaphoretic.  HENT:     Head: Normocephalic.     Right Ear: External ear normal.     Left Ear: External ear normal.  Eyes:     Pupils: Pupils are equal, round, and reactive to light.  Cardiovascular:     Rate and Rhythm: Normal rate.  Pulmonary:     Effort: Pulmonary effort is normal. No respiratory distress.  Musculoskeletal:        General: Normal range of motion.  Skin:    General: Skin is warm and dry.  Neurological:     Mental Status: He is alert and oriented to person, place, and time.  Psychiatric:        Mood and Affect: Mood is depressed.        Speech: Speech normal.        Behavior: Behavior is cooperative.        Thought Content: Thought content is not paranoid or delusional. Thought content does not include homicidal or suicidal ideation. Thought content does not include suicidal plan.   Review of Systems  Constitutional:  Negative for chills, diaphoresis, fever, malaise/fatigue and weight loss.  HENT:  Negative for congestion.   Respiratory:  Negative for cough and shortness of breath.   Cardiovascular:  Negative for chest pain and palpitations.  Gastrointestinal:  Negative for diarrhea, nausea and vomiting.  Neurological:  Negative for  dizziness and seizures.  Psychiatric/Behavioral:  Positive for depression and hallucinations. Negative for memory loss and suicidal ideas. The patient is not nervous/anxious and does not have insomnia.   All other systems reviewed and are negative.  Blood pressure 125/80, pulse 73,  temperature 98.4 F (36.9 C), resp. rate 16, SpO2 100 %. There is no height or weight on file to calculate BMI.  Musculoskeletal: Strength & Muscle Tone: within normal limits Gait & Station: normal Patient leans: N/A   [facility] MSE Discharge Disposition for Follow up and Recommendations: Based on my evaluation the patient does not appear to have an emergency medical condition and can be discharged with resources and follow up care in outpatient services for Medication Management and Individual Therapy  Prescription for Risperdal was sent to [facility] on 10/15/2021.    Jackelyn Poling, NP 10/19/2021, 3:24 AM

## 2021-10-19 NOTE — BH Assessment (Signed)
Care Management - Follow Up Discharges   Writer attempted to make contact with patient today and was unsuccessful.  No voicemail and phone just rang.   Per chart review, patient was provided with outpatient resources.

## 2021-10-19 NOTE — Discharge Instructions (Addendum)
  Discharge recommendations:  °Patient is to take medications as prescribed. °Please see information for follow-up appointment with psychiatry and therapy. °Please follow up with your primary care provider for all medical related needs.  ° °Therapy: We recommend that patient participate in individual therapy to address mental health concerns. ° °Medications: The patient is to contact a medical professional and/or outpatient provider to address any new side effects that develop. Patient should update outpatient providers of any new medications and/or medication changes.  ° °Atypical antipsychotics: If you are prescribed an atypical antipsychotic, it is recommended that your height, weight, BMI, blood pressure, fasting lipid panel, and fasting blood sugar be monitored by your outpatient providers. ° °Safety:  °The patient should abstain from use of illicit substances/drugs and abuse of any medications. °If symptoms worsen or do not continue to improve or if the patient becomes actively suicidal or homicidal then it is recommended that the patient return to the closest hospital emergency department, the [name], or call 911 for further evaluation and treatment. °National Suicide Prevention Lifeline 1-800-SUICIDE or 1-800-273-8255. ° °About 988 °988 offers 24/7 access to trained crisis counselors who can help people experiencing mental health-related distress. People can call or text 988 or chat 988lifeline.org for themselves or if they are worried about a loved one who may need crisis support.  ° °__________________________ °Therapy Walk-in Hours ° °Monday-Wednesday: 8 AM until slots are full ° °Friday: 1 PM to 5 PM ° °For Monday to Wednesday, it is recommended that patients arrive between 7:30 AM and 7:45 AM because patients will be seen in the order of arrival.  For Friday, we ask that patients arrive between 12 PM to 12:30 PM.  Go to the second floor on arrival and check  in. ° °**Availability is limited; therefore, patients may not be seen on the same day.** ° °Medication management walk-ins: ° °Monday to Friday: 8 AM to 11 AM. ° °It is recommended that patients arrive by 7:30 AM to 7:45 AM because patients will be seen in the order of arrival.  Go to the second floor on arrival and check in. ° °**Availability is limited; therefore, patients may not be seen on the same day.**  °

## 2021-10-19 NOTE — ED Notes (Signed)
Pt given AVS verbalized understanding to his ability

## 2021-10-19 NOTE — Progress Notes (Signed)
   10/19/21 0310  [facility] Triage Screening (Walk-ins at [facility] only)  How Did You Hear About [place]? Self  What Is the Reason for Your Visit/Call Today? Cole Barrett is a 35 year old male presenting voluntary to [facility] due to auditory hallucinations. Patient is requesting Risperdal for auditory hallucinations. Patient reported he ran out of medication and came here for a refill. Patient denied SI, HI and alcohol/drug usage. Patient was cooperative during assessment.  How Long Has This Been Causing You Problems? <Week  Have You Recently Had Any Thoughts About Hurting Yourself? No  Are You Planning to Commit Suicide/Harm Yourself At This time? No  Have you Recently Had Thoughts About Hurting Someone Cole Barrett? No  Are You Planning To Harm Someone At This Time? No  Are you currently experiencing any auditory, visual or other hallucinations? Yes  Please explain the hallucinations you are currently experiencing: "Hearing voices saying there going to kill me"  Have You Used Any Alcohol or Drugs in the Past 24 Hours? No  Do you have any current medical co-morbidities that require immediate attention? No  Clinician description of patient physical appearance/behavior: casual/cooperative  What Do You Feel Would Help You the Most Today? Medication(s)  If access to [facility] was not available, would you have sought care in the Emergency Department? Yes  Determination of Need Routine (7 days)  Options For Referral Medication Management

## 2021-10-22 ENCOUNTER — Encounter (HOSPITAL_COMMUNITY): Payer: Self-pay

## 2021-10-22 ENCOUNTER — Emergency Department (HOSPITAL_COMMUNITY)
Admission: EM | Admit: 2021-10-22 | Discharge: 2021-10-22 | Disposition: A | Discharge status: Home / Self Care | Payer: Self-pay | Attending: Emergency Medicine | Admitting: Emergency Medicine

## 2021-10-22 DIAGNOSIS — Z8616 Personal history of COVID-19: Secondary | ICD-10-CM | POA: Insufficient documentation

## 2021-10-22 DIAGNOSIS — Z760 Encounter for issue of repeat prescription: Secondary | ICD-10-CM | POA: Insufficient documentation

## 2021-10-22 DIAGNOSIS — F17210 Nicotine dependence, cigarettes, uncomplicated: Secondary | ICD-10-CM | POA: Insufficient documentation

## 2021-10-22 DIAGNOSIS — R440 Auditory hallucinations: Secondary | ICD-10-CM | POA: Insufficient documentation

## 2021-10-22 MED ORDER — RISPERIDONE 1 MG PO TABS
1.0000 mg | ORAL_TABLET | Freq: Once | ORAL | Status: AC
  Administered 2021-10-22: 1 mg via ORAL
  Filled 2021-10-22: qty 1

## 2021-10-22 NOTE — ED Triage Notes (Signed)
Patient arrives from home, reports hearing voices and states he needs his dose of risperdal. Denies any other complaints.

## 2021-10-22 NOTE — ED Provider Notes (Signed)
[facility]   CSN: [id number] Arrival date & time: 10/22/21  0215     History Chief Complaint  Patient presents with   Mental Health Problem    Cole Barrett is a 35 y.o. male with PMHx schizophrenia who presents to the ED today for mental health problem. Pt reports that he has been hearing voices. He is requesting a dose of his Risperdone. Per chart review pt has had 43 ED visits in the past 6 months for similar occurrences. Most recently was seen at [facility] on 11/22 for same. He denies any SI or HI. He otherwise has no complaints at this time.   The history is provided by the patient and medical records.      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 No pertinent surgical history.     Family History  Problem Relation Age of Onset   Hypertension Mother     Social History   Tobacco Use   Smoking status: Every Day    Packs/day: 0.50    Types: Cigarettes   Smokeless tobacco: Never  Vaping Use   Vaping Use: Never used  Substance Use Topics   Alcohol use: Yes     Comment: BAC not available   Drug use: Yes    Types: Marijuana    Comment: Last used: 2 months ago UDS NA    Home Medications Prior to Admission medications   Medication Sig Start Date End Date Taking? Authorizing Provider  ibuprofen (ADVIL) 600 MG tablet Take 1 tablet (600 mg total) by mouth every 6 (six) hours as needed. 03/15/21   Linwood Dibbles, MD  risperiDONE (RISPERDAL) 1 MG tablet Take 1 tablet (1 mg total) by mouth at bedtime. 10/15/21   Garlon Hatchet, PA-C  triamcinolone cream (KENALOG) 0.1 % Apply 1 application topically 2 (two) times daily. 05/16/21   Palumbo, April, MD  amantadine (SYMMETREL) 100 MG capsule Take 1 capsule (100 mg total) by mouth 2 (two) times daily. Patient not taking: Reported on 11/04/2019 09/01/19 11/17/19  [name], NP    Allergies    Haldol [haloperidol]  Review of Systems   Review of Systems  Constitutional:  Negative for fever.  Psychiatric/Behavioral:  Positive for hallucinations. Negative for sleep disturbance and suicidal ideas.   All other systems reviewed and are negative.  Physical Exam Updated Vital Signs BP (!) 164/72 (BP Location: Right Arm)   Pulse 94   Temp 97.8 F (36.6 C) (Oral)   Resp 16   Ht 5\' 10"  (1.778 m)   Wt 108.9 kg   SpO2 96%  BMI 34.44 kg/m   Physical Exam Vitals and nursing note reviewed.  Constitutional:      Appearance: He is not ill-appearing.  HENT:     Head: Normocephalic and atraumatic.  Eyes:     Conjunctiva/sclera: Conjunctivae normal.  Cardiovascular:     Rate and Rhythm: Normal rate and regular rhythm.  Pulmonary:     Effort: Pulmonary effort is normal.     Breath sounds: Normal breath sounds.  Skin:    General: Skin is warm and dry.     Coloration: Skin is not jaundiced.  Neurological:     Mental Status: He is alert.  Psychiatric:        Speech: Speech normal.        Behavior: Behavior normal.        Thought Content: Thought content does not include homicidal or suicidal ideation.  Thought content does not include homicidal or suicidal plan.    ED Results / Procedures / Treatments   Labs (all labs ordered are listed, but only abnormal results are displayed) Labs Reviewed - No data to display  EKG None  Radiology No results found.  Procedures Procedures   Medications Ordered in ED Medications  risperiDONE (RISPERDAL) tablet 1 mg (has no administration in time range)    ED Course  I have reviewed the triage vital signs and the nursing notes.  Pertinent labs & imaging results that were available during my care of the patient were reviewed by me and considered in my medical decision making (see chart for details).    MDM Rules/Calculators/A&P                           35 year old male presents to the ED today requesting dose of risperidone as he is hearing voices.  Patient is well-known to this department.  He has had 43 ED visits in the past 6 months for similar.  On arrival to the ED vitals are stable.  Patient appears to be in no acute distress.  He denies any SI or HI.  He is cooperative.  Speech is clear.  He was most recently seen at [facility] 3 days ago for same.  We will plan to provide risperidone dose and discharged home as I do not feel patient is a threat to himself or others at this time.  I do not feel he needs TTS eval today. Stable for discharge.   This note was prepared using Dragon voice recognition software and may include unintentional dictation errors due to the inherent limitations of voice recognition software.   Final Clinical Impression(s) / ED Diagnoses Final diagnoses:  Auditory hallucinations    Rx / DC Orders ED Discharge Orders     None      Discharge Instructions   None       Tanda Rockers, PA-C 10/22/21 0250    Zadie Rhine, MD 10/22/21 [phone]

## 2021-10-23 ENCOUNTER — Other Ambulatory Visit: Payer: Self-pay

## 2021-10-23 ENCOUNTER — Encounter (HOSPITAL_COMMUNITY): Payer: Self-pay | Admitting: Emergency Medicine

## 2021-10-23 ENCOUNTER — Emergency Department (HOSPITAL_COMMUNITY)
Admission: EM | Admit: 2021-10-23 | Discharge: 2021-10-23 | Disposition: A | Discharge status: Home / Self Care | Payer: Self-pay | Attending: Emergency Medicine | Admitting: Emergency Medicine

## 2021-10-23 DIAGNOSIS — Z760 Encounter for issue of repeat prescription: Secondary | ICD-10-CM | POA: Insufficient documentation

## 2021-10-23 DIAGNOSIS — Z8616 Personal history of COVID-19: Secondary | ICD-10-CM | POA: Insufficient documentation

## 2021-10-23 DIAGNOSIS — F17210 Nicotine dependence, cigarettes, uncomplicated: Secondary | ICD-10-CM | POA: Insufficient documentation

## 2021-10-23 MED ORDER — RISPERIDONE 1 MG PO TABS
1.0000 mg | ORAL_TABLET | Freq: Once | ORAL | Status: AC
  Administered 2021-10-23: 1 mg via ORAL
  Filled 2021-10-23: qty 1

## 2021-10-23 NOTE — ED Provider Notes (Signed)
[facility] EMERGENCY DEPARTMENT Provider Note   CSN: [id number] Arrival date & time: 10/23/21  [id number]     History Chief Complaint  Patient presents with   Medication Refill    Cole Barrett is a 35 y.o. male.  The history is provided by the patient and medical records.  Medication Refill  35 year old male here requesting dose of his Resporal.  He is seen in the ED almost every night for similar complaints.  He denies any other concerns at present.  Past Medical History:  Diagnosis Date   COVID-19    Homelessness    Hypercholesterolemia    Mental disorder    Paranoid behavior ([place])    PE (pulmonary embolism)    Schizophrenia ([place])     Patient Active Problem List   Diagnosis Date Noted   Acute psychosis ([place]) 08/31/2019   Schizophrenia ([place]) 08/31/2019   Schizoaffective disorder, bipolar type ([place]) 01/19/2019   Alcohol abuse    Auditory hallucinations    Alcohol abuse with alcohol-induced mood disorder ([place]) 09/08/2016   Homelessness 09/03/2016   Person feigning illness 09/03/2016   Brief psychotic disorder ([place]) 08/29/2016   Cannabis use disorder, mild, abuse 08/29/2016   Pulmonary emboli ([place]) 07/13/2016   Adjustment disorder with emotional disturbance 03/12/2014   Pulmonary embolism and infarction ([place]) 01/02/2014   Acute left flank pain 01/02/2014   Pulmonary embolism, bilateral ([place]) 01/02/2014    History reviewed. No pertinent surgical history.     Family History  Problem Relation Age of Onset   Hypertension Mother     Social History   Tobacco Use   Smoking status: Every Day    Packs/day: 0.50    Types: Cigarettes   Smokeless tobacco: Never  Vaping Use   Vaping Use: Never used  Substance Use Topics   Alcohol use: Yes    Comment: BAC not available   Drug use: Yes    Types: Marijuana    Comment: Last used: 2 months ago UDS NA    Home Medications Prior to Admission medications   Medication Sig Start Date End  Date Taking? Authorizing Provider  ibuprofen (ADVIL) 600 MG tablet Take 1 tablet (600 mg total) by mouth every 6 (six) hours as needed. 03/15/21   Dorie Rank, MD  risperiDONE (RISPERDAL) 1 MG tablet Take 1 tablet (1 mg total) by mouth at bedtime. 10/15/21   Larene Pickett, PA-C  triamcinolone cream (KENALOG) 0.1 % Apply 1 application topically 2 (two) times daily. 05/16/21   Palumbo, April, MD  amantadine (SYMMETREL) 100 MG capsule Take 1 capsule (100 mg total) by mouth 2 (two) times daily. Patient not taking: Reported on 11/04/2019 09/01/19 11/17/19  Mallie Darting, NP    Allergies    Haldol [haloperidol]  Review of Systems   Review of Systems  Psychiatric/Behavioral:         Needs risperdal  All other systems reviewed and are negative.  Physical Exam Updated Vital Signs BP 132/82 (BP Location: Left Arm)   Pulse 67   Temp 97.7 F (36.5 C) (Oral)   Resp 18   SpO2 100%   Physical Exam Vitals and nursing note reviewed.  Constitutional:      Appearance: He is well-developed.     Comments: Appears at baseline  HENT:     Head: Normocephalic and atraumatic.  Eyes:     Conjunctiva/sclera: Conjunctivae normal.     Pupils: Pupils are equal, round, and reactive to light.  Cardiovascular:  Rate and Rhythm: Normal rate and regular rhythm.     Heart sounds: Normal heart sounds.  Pulmonary:     Effort: Pulmonary effort is normal.     Breath sounds: Normal breath sounds.  Abdominal:     General: Bowel sounds are normal.     Palpations: Abdomen is soft.  Musculoskeletal:        General: Normal range of motion.     Cervical back: Normal range of motion.  Skin:    General: Skin is warm and dry.  Neurological:     Mental Status: He is alert and oriented to person, place, and time.    ED Results / Procedures / Treatments   Labs (all labs ordered are listed, but only abnormal results are displayed) Labs Reviewed - No data to display  EKG None  Radiology No results  found.  Procedures Procedures   Medications Ordered in ED Medications  risperiDONE (RISPERDAL) tablet 1 mg (1 mg Oral Given 10/23/21 0434)    ED Course  I have reviewed the triage vital signs and the nursing notes.  Pertinent labs & imaging results that were available during my care of the patient were reviewed by me and considered in my medical decision making (see chart for details).    MDM Rules/Calculators/A&P                           35 year old male here requesting dose of Risperdal.  This was given.  She has had refill sent to pharmacy recently, encouraged to pick this up.  Can follow-up with PCP.  Return here for new concerns.  Final Clinical Impression(s) / ED Diagnoses Final diagnoses:  Medication refill    Rx / DC Orders ED Discharge Orders     None        Garlon Hatchet, PA-C 10/23/21 Joselyn Glassman, MD 10/23/21 [phone]

## 2021-10-23 NOTE — ED Triage Notes (Signed)
Patient here for his Risperdal.

## 2021-10-25 ENCOUNTER — Ambulatory Visit (HOSPITAL_COMMUNITY)
Admission: EM | Admit: 2021-10-25 | Discharge: 2021-10-25 | Disposition: A | Discharge status: Home / Self Care | Payer: No Payment, Other | Attending: Nurse Practitioner | Admitting: Nurse Practitioner

## 2021-10-25 DIAGNOSIS — F32A Depression, unspecified: Secondary | ICD-10-CM | POA: Insufficient documentation

## 2021-10-25 DIAGNOSIS — R440 Auditory hallucinations: Secondary | ICD-10-CM | POA: Insufficient documentation

## 2021-10-25 MED ORDER — RISPERIDONE 1 MG PO TABS
1.0000 mg | ORAL_TABLET | Freq: Once | ORAL | Status: AC
  Administered 2021-10-25: 03:00:00 1 mg via ORAL
  Filled 2021-10-25: qty 1

## 2021-10-25 NOTE — ED Provider Notes (Signed)
Behavioral Health Urgent Care Medical Screening Exam  Patient Name: Cole Barrett MRN: [id number] Date of Evaluation: 10/25/21 Chief Complaint: The voices are louder tonight Diagnosis:  Final diagnoses:  Auditory hallucinations    History of Present illness: Cole Barrett is a 35 y.o. single male presenting unaccompanied voluntarily to [facility] with an increase in auditory hallucinations. Cole Barrett reports that [place]. Cole Barrett reports that he needs help with the voices and they are louder tonight than what they normally are. Patient reports that he woke up tonight from sleeping and the voices are saying they are going to kill him. Patient reports that the risperdal has been effective in controlling the voices. Patient denies any substance use or alcohol use or other stressors contributing to an exacerbation of his symptoms.  Patient presents with a slightly disheveled appearance but is calm and cooperative with a blunt, flat affect. Patient has had 44 ED visits in the last six months at each visits he requested a dose of his risperdal medication. Patient reports that he took his risperdal earlier in the day around 2pm or 3pm. Patient will be given a risperdal 1mg  to take now for breakthrough symptoms and will be discharged to follow up with [facility] outpatient services for ongoing follow up for medications management. Patient verbalized understanding of teaching Patient denies  any SI or HI tonight.    Psychiatric Specialty Exam  Presentation  General Appearance:Appropriate for Environment  Eye Contact:Fair  Speech:Clear and Coherent  Speech Volume:Normal  Handedness:Right    Mood and Affect  Mood:Depressed  Affect:Flat   Thought Process  Thought Processes:Coherent  Descriptions of Associations:Intact  Orientation:Full (Time, Place and Person)  Thought  Content:Delusions  Diagnosis of Schizophrenia or Schizoaffective disorder in past: Yes  Duration of Psychotic Symptoms: Greater than six months  Hallucinations:Auditory They are going to kill me  Ideas of Reference:None  Suicidal Thoughts:No  Homicidal Thoughts:No   Sensorium  Memory:Immediate Fair; Recent Fair; Remote Fair  Judgment:Good  Insight:Fair   Executive Functions  Concentration:Fair  Attention Span:Fair  Recall:Fair  Fund of Knowledge:Fair  Language:Good   Psychomotor Activity  Psychomotor Activity:Normal   Assets  Assets:Communication Skills; Desire for Improvement; Housing; Physical Health; Resilience   Sleep  Sleep:Good  Number of hours: -1   No data recorded  Physical Exam: Physical Exam Constitutional:      Appearance: Normal appearance.  HENT:     Head: Normocephalic and atraumatic.     Left Ear: Tympanic membrane normal.     Nose: Nose normal.     Mouth/Throat:     Mouth: Mucous membranes are moist.  Eyes:     Pupils: Pupils are equal, round, and reactive to light.  Cardiovascular:     Rate and Rhythm: Normal rate.  Pulmonary:     Effort: Pulmonary effort is normal.  Abdominal:     General: Abdomen is flat.  Musculoskeletal:        General: Normal range of motion.     Cervical back: Normal range of motion.  Skin:    General: Skin is warm.  Neurological:     Mental Status: He is alert and oriented to person, place, and time.  Psychiatric:        Attention and Perception: Attention normal.        Mood and Affect: Mood is depressed.        Speech: Speech normal.  Behavior: Behavior is cooperative.        Thought Content: Thought content is delusional.        Judgment: Judgment normal.   Review of Systems  Constitutional: Negative.   HENT: Negative.    Eyes: Negative.   Respiratory: Negative.    Cardiovascular: Negative.   Gastrointestinal: Negative.   Genitourinary: Negative.   Musculoskeletal: Negative.    Skin: Negative.   Blood pressure 124/72, pulse 73, temperature 98.6 F (37 C), resp. rate 20, SpO2 98 %. There is no height or weight on file to calculate BMI.  Musculoskeletal: Strength & Muscle Tone: within normal limits Gait & Station: normal Patient leans: N/A   [facility] MSE Discharge Disposition for Follow up and Recommendations: Based on my evaluation the patient does not appear to have an emergency medical condition and can be discharged with resources and follow up care in outpatient services for Medication Management and Individual Therapy. Patient has a current prescription of risperdal and does not need a new prescription at this time. Patient recommended to follow up with [facility] outpatient mental health services for ongoing services.    Jasper Riling, NP 10/25/2021, 2:54 AM

## 2021-10-25 NOTE — Discharge Instructions (Addendum)

## 2021-10-25 NOTE — Progress Notes (Signed)
TRIAGE: ROUTINE   10/25/21 0200  [facility] Triage Screening (Walk-ins at [facility] only)  How Did You Hear About [place]? Self  What Is the Reason for Your Visit/Call Today? Pt arrived to the [facility] requesting assistance for the headache he is experiencing due to the [facility] he has. Pt has been to the [facility] in the past, also sharing that he experiences AVH, but tonight pt is expressing concerns regarding the headache he has. When asked what pt would like to see happen from his visit tonight, pt states he would like help to make the headache go away and a place to lay down. Of note, pt is typically dressed in clean/casual clothes in comparison to the bedtime clothes he is wearing tonight, is more responsive to questions, and has a slightly jovial attitude; tonight, pt is having difficulties paying attention and has his eyes closed for parts of the triage. Pt denies past or present SI, any prior attempts to or a plan to kill himself, HI, VH, NSSIB, access to guns/weapons, engagement with the legal system, or SA. Pt states he last took his prescribed Risperdal earlier tonight.  How Long Has This Been Causing You Problems? <Week  Have You Recently Had Any Thoughts About Hurting Yourself? No  Are You Planning to Commit Suicide/Harm Yourself At This time? No  Have you Recently Had Thoughts About Hurting Someone [name]? No  Are You Planning To Harm Someone At This Time? No  Are you currently experiencing any auditory, visual or other hallucinations? Yes  Please explain the hallucinations you are currently experiencing: Pt share he is experiencing voices that are so intense they are giving him a headache.  Have You Used Any Alcohol or Drugs in the Past 24 Hours? No  Do you have any current medical co-morbidities that require immediate attention? No  Clinician description of patient physical appearance/behavior: Pt is typically dressed in clean/casual clothes in comparison to the bedtime clothes he is wearing tonight, is more  responsive to questions, and has a slightly jovial attitude; tonight, pt is having difficulties paying attention and has his eyes closed for parts of the triage.  What Do You Feel Would Help You the Most Today? Medication(s)  If access to [facility] was not available, would you have sought care in the Emergency Department? Yes  Determination of Need Routine (7 days)  Options For Referral Medication Management

## 2021-10-29 ENCOUNTER — Encounter (HOSPITAL_COMMUNITY): Payer: Self-pay

## 2021-10-29 ENCOUNTER — Emergency Department (HOSPITAL_COMMUNITY)
Admission: EM | Admit: 2021-10-29 | Discharge: 2021-10-29 | Disposition: A | Discharge status: Home / Self Care | Payer: Self-pay | Attending: Emergency Medicine | Admitting: Emergency Medicine

## 2021-10-29 ENCOUNTER — Telehealth (HOSPITAL_COMMUNITY): Payer: Self-pay

## 2021-10-29 DIAGNOSIS — R440 Auditory hallucinations: Secondary | ICD-10-CM | POA: Insufficient documentation

## 2021-10-29 DIAGNOSIS — Z760 Encounter for issue of repeat prescription: Secondary | ICD-10-CM | POA: Insufficient documentation

## 2021-10-29 DIAGNOSIS — F17210 Nicotine dependence, cigarettes, uncomplicated: Secondary | ICD-10-CM | POA: Insufficient documentation

## 2021-10-29 DIAGNOSIS — Z8616 Personal history of COVID-19: Secondary | ICD-10-CM | POA: Insufficient documentation

## 2021-10-29 MED ORDER — RISPERIDONE 1 MG PO TABS
1.0000 mg | ORAL_TABLET | Freq: Once | ORAL | Status: AC
  Administered 2021-10-29: 1 mg via ORAL
  Filled 2021-10-29: qty 1

## 2021-10-29 NOTE — Discharge Instructions (Addendum)
Take your home medications as prescribed. Return to the emergency room with any new, worsening, concerning symptoms

## 2021-10-29 NOTE — BH Assessment (Signed)
Care Management - Follow Up Discharges    Writer attempted to make contact with patient today and was unsuccessful.  Patient voicemail was not set up. Phone just rang.    Per chart review, patient was provided resources for outpatient psychiatric services on the second floor including open access walk-in hours.

## 2021-10-29 NOTE — ED Provider Notes (Signed)
[facility]-EMERGENCY DEPT Provider Note   CSN: [id number] Arrival date & time: 10/29/21  0236     History Chief Complaint  Patient presents with   Medication Refill    Cole Barrett is a 35 y.o. male presenting for medication refill.  Patient states he needs his Resporal tonight.  Last took it last night.  He is having auditory hallucinations, which are normal for him and unchanged.  He denies SI, HI.  He states he cannot take his medication as he is not at home and did not bring it with him.  Patient here frequently for the same. States there is nothing new about his sxs today.   HPI     Past Medical History:  Diagnosis Date   COVID-19    Homelessness    Hypercholesterolemia    Mental disorder    Paranoid behavior (H[facility])    PE (pulmonary embolism)    Schizophrenia ([facility])     Patient Active Problem List   Diagnosis Date Noted   Acute psychosis (HCC) 08/31/2019   Schizophrenia (H[facility]) 08/31/2019   Schizoaffective disorder, bipolar type (H[facility]) 01/19/2019   Alcohol abuse    Auditory hallucinations    Alcohol abuse with alcohol-induced mood disorder (HCC) 09/08/2016   Homelessness 09/03/2016   Person feigning illness 09/03/2016   Brief psychotic disorder (HCC) 08/29/2016   Cannabis use disorder, mild, abuse 08/29/2016   Pulmonary emboli (HCC) 07/13/2016   Adjustment disorder with emotional disturbance 03/12/2014   Pulmonary embolism and infarction (HCC) 01/02/2014   Acute left flank pain 01/02/2014   Pulmonary embolism, bilateral (HCC) 01/02/2014    History reviewed. No pertinent surgical history.     Family History  Problem Relation Age of Onset   Hypertension Mother     Social History   Tobacco Use   Smoking status: Every Day    Packs/day: 0.50    Types: Cigarettes   Smokeless tobacco: Never  Vaping Use   Vaping Use: Never used  Substance Use Topics   Alcohol use: Yes    Comment: BAC not available   Drug use: Yes     Types: Marijuana    Comment: Last used: 2 months ago UDS NA    Home Medications Prior to Admission medications   Medication Sig Start Date End Date Taking? Authorizing Provider  ibuprofen (ADVIL) 600 MG tablet Take 1 tablet (600 mg total) by mouth every 6 (six) hours as needed. 03/15/21   Linwood Dibbles, MD  risperiDONE (RISPERDAL) 1 MG tablet Take 1 tablet (1 mg total) by mouth at bedtime. 10/15/21   Garlon Hatchet, PA-C  triamcinolone cream (KENALOG) 0.1 % Apply 1 application topically 2 (two) times daily. 05/16/21   Palumbo, April, MD  amantadine (SYMMETREL) 100 MG capsule Take 1 capsule (100 mg total) by mouth 2 (two) times daily. Patient not taking: Reported on 11/04/2019 09/01/19 11/17/19  [name], NP    Allergies    Haldol [haloperidol]  Review of Systems   Review of Systems  Constitutional:  Negative for fever.  Psychiatric/Behavioral:  Positive for hallucinations.    Physical Exam Updated Vital Signs BP 135/88 (BP Location: Right Arm)   Pulse 65   Temp 97.6 F (36.4 C) (Oral)   Resp 16   Ht 5\' 10"  (1.778 m)   Wt 108.9 kg   SpO2 96%   BMI 34.44 kg/m   Physical Exam Vitals and nursing note reviewed.  Constitutional:      General: He is not  in acute distress.    Appearance: He is well-developed.  HENT:     Head: Normocephalic and atraumatic.  Eyes:     Extraocular Movements: Extraocular movements intact.  Cardiovascular:     Rate and Rhythm: Normal rate.  Pulmonary:     Effort: Pulmonary effort is normal.  Abdominal:     General: There is no distension.  Musculoskeletal:        General: Normal range of motion.     Cervical back: Normal range of motion.  Skin:    General: Skin is warm.     Findings: No rash.  Neurological:     Mental Status: He is alert and oriented to person, place, and time.  Psychiatric:        Attention and Perception: He perceives auditory hallucinations.     Comments: Calm and cooperative    ED Results / Procedures /  Treatments   Labs (all labs ordered are listed, but only abnormal results are displayed) Labs Reviewed - No data to display  EKG None  Radiology No results found.  Procedures Procedures   Medications Ordered in ED Medications  risperiDONE (RISPERDAL) tablet 1 mg (has no administration in time range)    ED Course  I have reviewed the triage vital signs and the nursing notes.  Pertinent labs & imaging results that were available during my care of the patient were reviewed by me and considered in my medical decision making (see chart for details).    MDM Rules/Calculators/A&P                           Pt presenting for medication refill, requesting a dose of his Risperdal.  On exam, patient appears nontoxic.  He is calm and cooperative, not obviously responding to internal stimuli.  He does not appear to be a danger to himself or others, has chronic auditory hallucinations which he states are unchanged.  Will give a dose of his Risperdal here and discharge.  Return precautions given.  Patient states he understands and agrees to plan.  Final Clinical Impression(s) / ED Diagnoses Final diagnoses:  Medication refill  Auditory hallucination    Rx / DC Orders ED Discharge Orders     None        Franchot Heidelberg, PA-C 10/29/21 0309    Molpus, Jenny Reichmann, MD 10/29/21 [phone]

## 2021-10-29 NOTE — ED Triage Notes (Signed)
Patient requesting a dose of Risperdal. Reports starting to hear voices and knows its due

## 2021-11-01 ENCOUNTER — Other Ambulatory Visit: Payer: Self-pay

## 2021-11-01 ENCOUNTER — Emergency Department (HOSPITAL_COMMUNITY)
Admission: EM | Admit: 2021-11-01 | Discharge: 2021-11-01 | Disposition: A | Discharge status: Home / Self Care | Payer: Self-pay | Attending: Emergency Medicine | Admitting: Emergency Medicine

## 2021-11-01 ENCOUNTER — Encounter (HOSPITAL_COMMUNITY): Payer: Self-pay

## 2021-11-01 DIAGNOSIS — F17210 Nicotine dependence, cigarettes, uncomplicated: Secondary | ICD-10-CM | POA: Insufficient documentation

## 2021-11-01 DIAGNOSIS — Z760 Encounter for issue of repeat prescription: Secondary | ICD-10-CM | POA: Insufficient documentation

## 2021-11-01 DIAGNOSIS — Z5900 Homelessness unspecified: Secondary | ICD-10-CM | POA: Insufficient documentation

## 2021-11-01 DIAGNOSIS — Z8616 Personal history of COVID-19: Secondary | ICD-10-CM | POA: Insufficient documentation

## 2021-11-01 MED ORDER — RISPERIDONE 1 MG PO TABS
1.0000 mg | ORAL_TABLET | Freq: Once | ORAL | Status: AC
  Administered 2021-11-01: 1 mg via ORAL
  Filled 2021-11-01: qty 1

## 2021-11-01 NOTE — ED Triage Notes (Signed)
Patient requesting a dose of Risperdal. Pt reports that he is starting to have hallucinations.

## 2021-11-01 NOTE — ED Provider Notes (Signed)
[facility] DEPT Provider Note   CSN: [id number] Arrival date & time: 11/01/21  [id number]     History Chief Complaint  Patient presents with   Medication Refill    Cole Barrett is a 35 y.o. male.  The history is provided by the patient and medical records.   35 y.o. M with hx schizophrenia, homelessness, presenting to the ED requesting night time dose of risperdal.  Well known to this hospital system for same, seen almost nightly.  Also asking for something to eat.  Past Medical History:  Diagnosis Date   COVID-19    Homelessness    Hypercholesterolemia    Mental disorder    Paranoid behavior ([place])    PE (pulmonary embolism)    Schizophrenia ([place])     Patient Active Problem List   Diagnosis Date Noted   Acute psychosis ([place]) 08/31/2019   Schizophrenia ([place]) 08/31/2019   Schizoaffective disorder, bipolar type ([place]) 01/19/2019   Alcohol abuse    Auditory hallucinations    Alcohol abuse with alcohol-induced mood disorder ([place]) 09/08/2016   Homelessness 09/03/2016   Person feigning illness 09/03/2016   Brief psychotic disorder ([place]) 08/29/2016   Cannabis use disorder, mild, abuse 08/29/2016   Pulmonary emboli ([place]) 07/13/2016   Adjustment disorder with emotional disturbance 03/12/2014   Pulmonary embolism and infarction ([place]) 01/02/2014   Acute left flank pain 01/02/2014   Pulmonary embolism, bilateral ([place]) 01/02/2014    History reviewed. No pertinent surgical history.     Family History  Problem Relation Age of Onset   Hypertension Mother     Social History   Tobacco Use   Smoking status: Every Day    Packs/day: 0.50    Types: Cigarettes   Smokeless tobacco: Never  Vaping Use   Vaping Use: Never used  Substance Use Topics   Alcohol use: Yes    Comment: BAC not available   Drug use: Yes    Types: Marijuana    Comment: Last used: 2 months ago UDS NA    Home Medications Prior to Admission medications    Medication Sig Start Date End Date Taking? Authorizing Provider  ibuprofen (ADVIL) 600 MG tablet Take 1 tablet (600 mg total) by mouth every 6 (six) hours as needed. 03/15/21   Dorie Rank, MD  risperiDONE (RISPERDAL) 1 MG tablet Take 1 tablet (1 mg total) by mouth at bedtime. 10/15/21   Larene Pickett, PA-C  triamcinolone cream (KENALOG) 0.1 % Apply 1 application topically 2 (two) times daily. 05/16/21   Palumbo, April, MD  amantadine (SYMMETREL) 100 MG capsule Take 1 capsule (100 mg total) by mouth 2 (two) times daily. Patient not taking: Reported on 11/04/2019 09/01/19 11/17/19  Mallie Darting, NP    Allergies    Haldol [haloperidol]  Review of Systems   Review of Systems  Psychiatric/Behavioral:         Needs medication  All other systems reviewed and are negative.  Physical Exam Updated Vital Signs BP 115/66 (BP Location: Right Arm)   Pulse 65   Temp (!) 97.5 F (36.4 C) (Oral)   Resp 16   SpO2 98%   Physical Exam Vitals and nursing note reviewed.  Constitutional:      Appearance: He is well-developed.  HENT:     Head: Normocephalic and atraumatic.  Eyes:     Conjunctiva/sclera: Conjunctivae normal.     Pupils: Pupils are equal, round, and reactive to light.  Cardiovascular:     Rate and  Rhythm: Normal rate and regular rhythm.     Heart sounds: Normal heart sounds.  Pulmonary:     Effort: Pulmonary effort is normal.     Breath sounds: Normal breath sounds.  Abdominal:     General: Bowel sounds are normal.     Palpations: Abdomen is soft.  Musculoskeletal:        General: Normal range of motion.     Cervical back: Normal range of motion.  Skin:    General: Skin is warm and dry.  Neurological:     Mental Status: He is alert and oriented to person, place, and time.    ED Results / Procedures / Treatments   Labs (all labs ordered are listed, but only abnormal results are displayed) Labs Reviewed - No data to display  EKG None  Radiology No results  found.  Procedures Procedures   Medications Ordered in ED Medications  risperiDONE (RISPERDAL) tablet 1 mg (1 mg Oral Given 11/01/21 0308)    ED Course  I have reviewed the triage vital signs and the nursing notes.  Pertinent labs & imaging results that were available during my care of the patient were reviewed by me and considered in my medical decision making (see chart for details).    MDM Rules/Calculators/A&P                           35 y.o. M here requesting dose of risperdal, seen frequently for same.  No additional complaints at present. Given night time dose.  Stable for discharge with OP follow-up.    Final Clinical Impression(s) / ED Diagnoses Final diagnoses:  Medication refill    Rx / DC Orders ED Discharge Orders     None        Garlon Hatchet, PA-C 11/01/21 Kandra Nicolas, MD 11/01/21 [phone]

## 2021-11-01 NOTE — ED Notes (Signed)
Pt given sandwich and sprite.  

## 2021-11-02 ENCOUNTER — Emergency Department (HOSPITAL_COMMUNITY)
Admission: EM | Admit: 2021-11-02 | Discharge: 2021-11-02 | Disposition: A | Discharge status: Home / Self Care | Payer: Self-pay | Attending: Emergency Medicine | Admitting: Emergency Medicine

## 2021-11-02 ENCOUNTER — Encounter (HOSPITAL_COMMUNITY): Payer: Self-pay

## 2021-11-02 ENCOUNTER — Other Ambulatory Visit: Payer: Self-pay

## 2021-11-02 DIAGNOSIS — Z8616 Personal history of COVID-19: Secondary | ICD-10-CM | POA: Insufficient documentation

## 2021-11-02 DIAGNOSIS — R440 Auditory hallucinations: Secondary | ICD-10-CM | POA: Insufficient documentation

## 2021-11-02 DIAGNOSIS — F17210 Nicotine dependence, cigarettes, uncomplicated: Secondary | ICD-10-CM | POA: Insufficient documentation

## 2021-11-02 MED ORDER — RISPERIDONE 1 MG PO TABS: 1.0000 mg | ORAL_TABLET | Freq: Once | ORAL | Status: DC

## 2021-11-02 MED ORDER — RISPERIDONE 1 MG PO TABS
1.0000 mg | ORAL_TABLET | Freq: Once | ORAL | Status: AC
  Administered 2021-11-02: 1 mg via ORAL

## 2021-11-02 NOTE — ED Provider Notes (Signed)
[facility] DEPT Provider Note   CSN: [id number] Arrival date & time: 11/02/21  0215     History No chief complaint on file.   Cole Barrett is a 35 y.o. male.  Patient presents to the emergency department stating that he is hearing voices.  He is not homicidal or suicidal.      Past Medical History:  Diagnosis Date   COVID-19    Homelessness    Hypercholesterolemia    Mental disorder    Paranoid behavior ([place])    PE (pulmonary embolism)    Schizophrenia ([place])     Patient Active Problem List   Diagnosis Date Noted   Acute psychosis ([place]) 08/31/2019   Schizophrenia ([place]) 08/31/2019   Schizoaffective disorder, bipolar type ([place]) 01/19/2019   Alcohol abuse    Auditory hallucinations    Alcohol abuse with alcohol-induced mood disorder ([place]) 09/08/2016   Homelessness 09/03/2016   Person feigning illness 09/03/2016   Brief psychotic disorder ([place]) 08/29/2016   Cannabis use disorder, mild, abuse 08/29/2016   Pulmonary emboli ([place]) 07/13/2016   Adjustment disorder with emotional disturbance 03/12/2014   Pulmonary embolism and infarction ([place]) 01/02/2014   Acute left flank pain 01/02/2014   Pulmonary embolism, bilateral ([place]) 01/02/2014    No past surgical history on file.     Family History  Problem Relation Age of Onset   Hypertension Mother     Social History   Tobacco Use   Smoking status: Every Day    Packs/day: 0.50    Types: Cigarettes   Smokeless tobacco: Never  Vaping Use   Vaping Use: Never used  Substance Use Topics   Alcohol use: Yes    Comment: BAC not available   Drug use: Yes    Types: Marijuana    Comment: Last used: 2 months ago UDS NA    Home Medications Prior to Admission medications   Medication Sig Start Date End Date Taking? Authorizing Provider  ibuprofen (ADVIL) 600 MG tablet Take 1 tablet (600 mg total) by mouth every 6 (six) hours as needed. 03/15/21   Dorie Rank, MD  risperiDONE  (RISPERDAL) 1 MG tablet Take 1 tablet (1 mg total) by mouth at bedtime. 10/15/21   Larene Pickett, PA-C  triamcinolone cream (KENALOG) 0.1 % Apply 1 application topically 2 (two) times daily. 05/16/21   Palumbo, April, MD  amantadine (SYMMETREL) 100 MG capsule Take 1 capsule (100 mg total) by mouth 2 (two) times daily. Patient not taking: Reported on 11/04/2019 09/01/19 11/17/19  Mallie Darting, NP    Allergies    Haldol [haloperidol]  Review of Systems   Review of Systems  Psychiatric/Behavioral:  Positive for hallucinations. Negative for suicidal ideas.   All other systems reviewed and are negative.  Physical Exam Updated Vital Signs There were no vitals taken for this visit.  Physical Exam Vitals and nursing note reviewed.  Constitutional:      General: He is not in acute distress.    Appearance: Normal appearance. He is well-developed.  HENT:     Head: Normocephalic and atraumatic.     Right Ear: Hearing normal.     Left Ear: Hearing normal.     Nose: Nose normal.  Eyes:     Conjunctiva/sclera: Conjunctivae normal.     Pupils: Pupils are equal, round, and reactive to light.  Cardiovascular:     Rate and Rhythm: Regular rhythm.     Heart sounds: S1 normal and S2 normal. No murmur heard.  No friction rub. No gallop.  Pulmonary:     Effort: Pulmonary effort is normal. No respiratory distress.     Breath sounds: Normal breath sounds.  Chest:     Chest wall: No tenderness.  Abdominal:     General: Bowel sounds are normal.     Palpations: Abdomen is soft.     Tenderness: There is no abdominal tenderness. There is no guarding or rebound. Negative signs include Murphy's sign and McBurney's sign.     Hernia: No hernia is present.  Musculoskeletal:        General: Normal range of motion.     Cervical back: Normal range of motion and neck supple.  Skin:    General: Skin is warm and dry.     Findings: No rash.  Neurological:     Mental Status: He is alert and oriented to  person, place, and time.     GCS: GCS eye subscore is 4. GCS verbal subscore is 5. GCS motor subscore is 6.     Cranial Nerves: No cranial nerve deficit.     Sensory: No sensory deficit.     Coordination: Coordination normal.  Psychiatric:        Mood and Affect: Affect is blunt.        Speech: Speech is delayed.        Behavior: Behavior is slowed and withdrawn.        Thought Content: Thought content normal. Thought content does not include suicidal ideation. Thought content does not include suicidal plan.    ED Results / Procedures / Treatments   Labs (all labs ordered are listed, but only abnormal results are displayed) Labs Reviewed - No data to display  EKG None  Radiology No results found.  Procedures Procedures   Medications Ordered in ED Medications - No data to display  ED Course  I have reviewed the triage vital signs and the nursing notes.  Pertinent labs & imaging results that were available during my care of the patient were reviewed by me and considered in my medical decision making (see chart for details).    MDM Rules/Calculators/A&P                           Presents to the emergency department stating that he got off work tonight and he was hearing voices.  Patient normally takes Risperdal.  He comes into the emergency department most nights for his Risperdal dose.  He has not homicidal or suicidal.  He does not appear to be a threat or harm to himself or others. Final Clinical Impression(s) / ED Diagnoses Final diagnoses:  Auditory hallucinations    Rx / DC Orders ED Discharge Orders     None        Timmothy Baranowski, Canary Brim, MD 11/02/21 [phone]

## 2021-11-02 NOTE — ED Triage Notes (Signed)
Pt states that he is hearing voices.

## 2021-11-04 ENCOUNTER — Emergency Department (HOSPITAL_COMMUNITY)
Admission: EM | Admit: 2021-11-04 | Discharge: 2021-11-04 | Disposition: A | Discharge status: Home / Self Care | Payer: Self-pay | Attending: Emergency Medicine | Admitting: Emergency Medicine

## 2021-11-04 ENCOUNTER — Other Ambulatory Visit: Payer: Self-pay

## 2021-11-04 ENCOUNTER — Encounter (HOSPITAL_COMMUNITY): Payer: Self-pay

## 2021-11-04 DIAGNOSIS — F17210 Nicotine dependence, cigarettes, uncomplicated: Secondary | ICD-10-CM | POA: Insufficient documentation

## 2021-11-04 DIAGNOSIS — Z8616 Personal history of COVID-19: Secondary | ICD-10-CM | POA: Insufficient documentation

## 2021-11-04 DIAGNOSIS — Z760 Encounter for issue of repeat prescription: Secondary | ICD-10-CM | POA: Insufficient documentation

## 2021-11-04 MED ORDER — RISPERIDONE 1 MG PO TABS
1.0000 mg | ORAL_TABLET | Freq: Once | ORAL | Status: AC
  Administered 2021-11-04: 1 mg via ORAL
  Filled 2021-11-04: qty 1

## 2021-11-04 NOTE — ED Triage Notes (Signed)
Pt requests a medication refill of Risperidone and would like to speak with a Child psychotherapist.

## 2021-11-04 NOTE — ED Provider Notes (Signed)
[facility] DEPT Provider Note   CSN: [id number] Arrival date & time: 11/04/21  0129     History Chief Complaint  Patient presents with   Medication Refill   Social Work     Cole Barrett is a 35 y.o. male.  The history is provided by the patient and medical records.  Medication Refill  [name] with hx of homelessness, schizophrenia, presenting to the ED wanting dose of his risperdal.  States he has not had it today.  He denies hallucinations at present.  Denies SI/HI.  States he wants to speak with Education officer, museum but will not state what for.  Past Medical History:  Diagnosis Date   COVID-19    Homelessness    Hypercholesterolemia    Mental disorder    Paranoid behavior ([place])    PE (pulmonary embolism)    Schizophrenia ([place])     Patient Active Problem List   Diagnosis Date Noted   Acute psychosis ([place]) 08/31/2019   Schizophrenia ([place]) 08/31/2019   Schizoaffective disorder, bipolar type ([place]) 01/19/2019   Alcohol abuse    Auditory hallucinations    Alcohol abuse with alcohol-induced mood disorder ([place]) 09/08/2016   Homelessness 09/03/2016   Person feigning illness 09/03/2016   Brief psychotic disorder ([place]) 08/29/2016   Cannabis use disorder, mild, abuse 08/29/2016   Pulmonary emboli ([place]) 07/13/2016   Adjustment disorder with emotional disturbance 03/12/2014   Pulmonary embolism and infarction ([place]) 01/02/2014   Acute left flank pain 01/02/2014   Pulmonary embolism, bilateral ([place]) 01/02/2014    History reviewed. No pertinent surgical history.     Family History  Problem Relation Age of Onset   Hypertension Mother     Social History   Tobacco Use   Smoking status: Every Day    Packs/day: 0.50    Types: Cigarettes   Smokeless tobacco: Never  Vaping Use   Vaping Use: Never used  Substance Use Topics   Alcohol use: Yes    Comment: BAC not available   Drug use: Yes    Types: Marijuana    Comment: Last  used: 2 months ago UDS NA    Home Medications Prior to Admission medications   Medication Sig Start Date End Date Taking? Authorizing Provider  ibuprofen (ADVIL) 600 MG tablet Take 1 tablet (600 mg total) by mouth every 6 (six) hours as needed. 03/15/21   Dorie Rank, MD  risperiDONE (RISPERDAL) 1 MG tablet Take 1 tablet (1 mg total) by mouth at bedtime. 10/15/21   Larene Pickett, PA-C  triamcinolone cream (KENALOG) 0.1 % Apply 1 application topically 2 (two) times daily. 05/16/21   Palumbo, April, MD  amantadine (SYMMETREL) 100 MG capsule Take 1 capsule (100 mg total) by mouth 2 (two) times daily. Patient not taking: Reported on 11/04/2019 09/01/19 11/17/19  Mallie Darting, NP    Allergies    Haldol [haloperidol]  Review of Systems   Review of Systems  Constitutional:        Medication needs  All other systems reviewed and are negative.  Physical Exam Updated Vital Signs BP 118/72 (BP Location: Left Arm)   Pulse 82   Temp 98.3 F (36.8 C) (Oral)   Resp 17   SpO2 97%   Physical Exam Vitals and nursing note reviewed.  Constitutional:      Appearance: He is well-developed.  HENT:     Head: Normocephalic and atraumatic.  Eyes:     Conjunctiva/sclera: Conjunctivae normal.  Pupils: Pupils are equal, round, and reactive to light.  Cardiovascular:     Rate and Rhythm: Normal rate and regular rhythm.     Heart sounds: Normal heart sounds.  Pulmonary:     Effort: Pulmonary effort is normal. No respiratory distress.     Breath sounds: Normal breath sounds. No rhonchi.  Abdominal:     General: Bowel sounds are normal.     Palpations: Abdomen is soft.     Tenderness: There is no abdominal tenderness. There is no rebound.  Musculoskeletal:        General: Normal range of motion.     Cervical back: Normal range of motion.  Skin:    General: Skin is warm and dry.  Neurological:     Mental Status: He is alert and oriented to person, place, and time.  Psychiatric:      Comments: Denies SI/HI, no actively hallucinating    ED Results / Procedures / Treatments   Labs (all labs ordered are listed, but only abnormal results are displayed) Labs Reviewed - No data to display  EKG None  Radiology No results found.  Procedures Procedures   Medications Ordered in ED Medications  risperiDONE (RISPERDAL) tablet 1 mg (has no administration in time range)    ED Course  I have reviewed the triage vital signs and the nursing notes.  Pertinent labs & imaging results that were available during my care of the patient were reviewed by me and considered in my medical decision making (see chart for details).    MDM Rules/Calculators/A&P                           35 year old male here requesting his Risperdal.  On this for chronic hallucinations.  He is not actively hallucinating and denies any suicidal or homicidal ideation.  Given dose of Risperdal.  He is asking to speak to Child psychotherapist, however cannot state why.  It appears CSW was contacted recently for same but unable to follow-up with patient.  Feel he can follow-up with his ACT team or [facility].  Return here for new concerns.  Final Clinical Impression(s) / ED Diagnoses Final diagnoses:  Medication refill    Rx / DC Orders ED Discharge Orders     None        Garlon Hatchet, PA-C 11/04/21 0602    Molpus, Jonny Ruiz, MD 11/04/21 [phone]

## 2021-11-05 ENCOUNTER — Other Ambulatory Visit: Payer: Self-pay

## 2021-11-05 ENCOUNTER — Emergency Department (HOSPITAL_COMMUNITY)
Admission: EM | Admit: 2021-11-05 | Discharge: 2021-11-05 | Disposition: A | Discharge status: Home / Self Care | Payer: Self-pay | Attending: Emergency Medicine | Admitting: Emergency Medicine

## 2021-11-05 ENCOUNTER — Encounter (HOSPITAL_COMMUNITY): Payer: Self-pay

## 2021-11-05 ENCOUNTER — Emergency Department (HOSPITAL_COMMUNITY): Payer: Self-pay

## 2021-11-05 DIAGNOSIS — Z5321 Procedure and treatment not carried out due to patient leaving prior to being seen by health care provider: Secondary | ICD-10-CM | POA: Insufficient documentation

## 2021-11-05 DIAGNOSIS — M25512 Pain in left shoulder: Secondary | ICD-10-CM | POA: Insufficient documentation

## 2021-11-05 NOTE — ED Triage Notes (Signed)
Pt reports with left shoulder pain since last night.

## 2021-11-09 ENCOUNTER — Emergency Department (HOSPITAL_COMMUNITY)
Admission: EM | Admit: 2021-11-09 | Discharge: 2021-11-09 | Disposition: A | Discharge status: Home / Self Care | Payer: Self-pay | Attending: Emergency Medicine | Admitting: Emergency Medicine

## 2021-11-09 ENCOUNTER — Other Ambulatory Visit: Payer: Self-pay

## 2021-11-09 ENCOUNTER — Encounter (HOSPITAL_COMMUNITY): Payer: Self-pay

## 2021-11-09 DIAGNOSIS — Z760 Encounter for issue of repeat prescription: Secondary | ICD-10-CM | POA: Insufficient documentation

## 2021-11-09 DIAGNOSIS — Z8616 Personal history of COVID-19: Secondary | ICD-10-CM | POA: Insufficient documentation

## 2021-11-09 DIAGNOSIS — F17210 Nicotine dependence, cigarettes, uncomplicated: Secondary | ICD-10-CM | POA: Insufficient documentation

## 2021-11-09 DIAGNOSIS — Z5900 Homelessness unspecified: Secondary | ICD-10-CM | POA: Insufficient documentation

## 2021-11-09 MED ORDER — RISPERIDONE 1 MG PO TABS
1.0000 mg | ORAL_TABLET | Freq: Once | ORAL | Status: AC
  Administered 2021-11-09: 1 mg via ORAL
  Filled 2021-11-09: qty 1

## 2021-11-09 NOTE — ED Triage Notes (Signed)
Pt requests a medication refill for his Risperidone.

## 2021-11-09 NOTE — ED Provider Notes (Signed)
[facility]  HOSPITAL-EMERGENCY DEPT Provider Note   CSN: [id number] Arrival date & time: 11/09/21  4174     History Chief Complaint  Patient presents with   Medication Refill    Cole Barrett is a 35 y.o. male.  Patient presents to the emergency department requesting his nighttime Risperdal dose.  He is homeless, reports he has nowhere else to go.      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 No pertinent surgical history.     Family History  Problem Relation Age of Onset   Hypertension Mother     Social History   Tobacco Use   Smoking status: Every Day    Packs/day: 0.50    Types: Cigarettes   Smokeless tobacco: Never  Vaping Use   Vaping Use: Never used  Substance Use Topics   Alcohol use: Yes    Comment: BAC not available   Drug use: Yes    Types: Marijuana    Comment: Last used: 2 months ago UDS NA    Home Medications Prior to Admission medications   Medication Sig Start Date End Date Taking? Authorizing Provider  ibuprofen (ADVIL) 600 MG tablet Take 1 tablet (600 mg total) by  mouth every 6 (six) hours as needed. 03/15/21   Linwood Dibbles, MD  risperiDONE (RISPERDAL) 1 MG tablet Take 1 tablet (1 mg total) by mouth at bedtime. 10/15/21   Garlon Hatchet, PA-C  triamcinolone cream (KENALOG) 0.1 % Apply 1 application topically 2 (two) times daily. 05/16/21   Palumbo, April, MD  amantadine (SYMMETREL) 100 MG capsule Take 1 capsule (100 mg total) by mouth 2 (two) times daily. Patient not taking: Reported on 11/04/2019 09/01/19 11/17/19  [name], NP    Allergies    Haldol [haloperidol]  Review of Systems   Review of Systems  Psychiatric/Behavioral:  Positive for suicidal ideas.   All other systems reviewed and are negative.  Physical Exam Updated Vital Signs BP 112/71 (BP Location: Left Arm)   Pulse 82   Temp (!) 97.4 F (36.3 C) (Oral)   Resp 14   SpO2 99%   Physical Exam Vitals and nursing note reviewed.  Constitutional:      General: He is not in acute distress.    Appearance: Normal appearance. He is well-developed.  HENT:     Head: Normocephalic and atraumatic.     Right Ear: Hearing normal.     Left Ear: Hearing normal.     Nose: Nose normal.  Eyes:     Conjunctiva/sclera: Conjunctivae normal.     Pupils: Pupils are equal, round, and reactive to  light.  Cardiovascular:     Rate and Rhythm: Regular rhythm.     Heart sounds: S1 normal and S2 normal. No murmur heard.   No friction rub. No gallop.  Pulmonary:     Effort: Pulmonary effort is normal. No respiratory distress.     Breath sounds: Normal breath sounds.  Chest:     Chest wall: No tenderness.  Abdominal:     General: Bowel sounds are normal.     Palpations: Abdomen is soft.     Tenderness: There is no abdominal tenderness. There is no guarding or rebound. Negative signs include Murphy's sign and McBurney's sign.     Hernia: No hernia is present.  Musculoskeletal:        General: Normal range of motion.     Cervical back: Normal range of motion and neck supple.  Skin:    General:  Skin is warm and dry.     Findings: No rash.  Neurological:     Mental Status: He is alert and oriented to person, place, and time.     GCS: GCS eye subscore is 4. GCS verbal subscore is 5. GCS motor subscore is 6.     Cranial Nerves: No cranial nerve deficit.     Sensory: No sensory deficit.     Coordination: Coordination normal.  Psychiatric:        Speech: Speech normal.        Behavior: Behavior normal.        Thought Content: Thought content normal.    ED Results / Procedures / Treatments   Labs (all labs ordered are listed, but only abnormal results are displayed) Labs Reviewed - No data to display  EKG None  Radiology No results found.  Procedures Procedures   Medications Ordered in ED Medications - No data to display  ED Course  I have reviewed the triage vital signs and the nursing notes.  Pertinent labs & imaging results that were available during my care of the patient were reviewed by me and considered in my medical decision making (see chart for details).    MDM Rules/Calculators/A&P                           Patient in no distress.  Not homicidal or suicidal.  Final Clinical Impression(s) / ED Diagnoses Final diagnoses:  Homeless    Rx / DC Orders ED Discharge Orders     None        Joselynne Killam, Gwenyth Allegra, MD 11/09/21 [phone]

## 2021-11-10 ENCOUNTER — Other Ambulatory Visit: Payer: Self-pay

## 2021-11-10 ENCOUNTER — Emergency Department (HOSPITAL_COMMUNITY)
Admission: EM | Admit: 2021-11-10 | Discharge: 2021-11-10 | Disposition: A | Discharge status: Home / Self Care | Payer: Self-pay | Attending: Emergency Medicine | Admitting: Emergency Medicine

## 2021-11-10 DIAGNOSIS — F17210 Nicotine dependence, cigarettes, uncomplicated: Secondary | ICD-10-CM | POA: Insufficient documentation

## 2021-11-10 DIAGNOSIS — Z8616 Personal history of COVID-19: Secondary | ICD-10-CM | POA: Insufficient documentation

## 2021-11-10 DIAGNOSIS — Z760 Encounter for issue of repeat prescription: Secondary | ICD-10-CM | POA: Insufficient documentation

## 2021-11-10 MED ORDER — RISPERIDONE 1 MG PO TABS
1.0000 mg | ORAL_TABLET | Freq: Once | ORAL | Status: AC
  Administered 2021-11-10: 06:00:00 1 mg via ORAL
  Filled 2021-11-10: qty 1

## 2021-11-10 NOTE — ED Notes (Signed)
Patient given discharge instructions. Questions were answered. Patient verbalized understanding of discharge instructions and care at home.  

## 2021-11-10 NOTE — ED Triage Notes (Signed)
°  Patient arrives requesting a dose of risperidone.

## 2021-11-10 NOTE — ED Provider Notes (Signed)
[name] EMERGENCY DEPARTMENT Provider Note   CSN: [id number] Arrival date & time: 11/10/21  1517    History Med Refill  Cole Barrett is a 35 y.o. male past medical history significant for schizophrenia, homelessness, well-known here to the emergency department, here almost nightly for his dose of risperidone presents for evaluation of medication refill.  Requesting his nightly dose of Risperdal.  States it is cold outside he has no place else to go.  Does admit to some intermittent audio hallucinations which are chronic and have not changed.  Denies command hallucinations.  He denies any SI, HI.  Had last dose of Risperdal greater than 24 hours ago.  Requesting something to eat.  He denies any pain, shortness of breath, cough.  Seen here frequently for similar.  States there is nothing new about his symptoms today. Denies additional aggravating or alleviating factors.  History obtained from patient and past medical records.  No interpreter used.  HPI     Past Medical History:  Diagnosis Date   COVID-19    Homelessness    Hypercholesterolemia    Mental disorder    Paranoid behavior ([facility])    PE (pulmonary embolism)    Schizophrenia ([facility]CC)     Patient Active Problem List   Diagnosis Date Noted   Acute psychosis (HCC) 08/31/2019   Schizophrenia (HCC) 08/31/2019   Schizoaffective disorder, bipolar type (HCC) 01/19/2019   Alcohol abuse    Auditory hallucinations    Alcohol abuse with alcohol-induced mood disorder (HCC) 09/08/2016   Homelessness 09/03/2016   Person feigning illness 09/03/2016   Brief psychotic disorder (HCC) 08/29/2016   Cannabis use disorder, mild, abuse 08/29/2016   Pulmonary emboli (HCC) 07/13/2016   Adjustment disorder with emotional disturbance 03/12/2014   Pulmonary embolism and infarction (HCC) 01/02/2014   Acute left flank pain 01/02/2014   Pulmonary embolism, bilateral (HCC) 01/02/2014    No past surgical history on  file.     Family History  Problem Relation Age of Onset   Hypertension Mother     Social History   Tobacco Use   Smoking status: Every Day    Packs/day: 0.50    Types: Cigarettes   Smokeless tobacco: Never  Vaping Use   Vaping Use: Never used  Substance Use Topics   Alcohol use: Yes    Comment: BAC not available   Drug use: Yes    Types: Marijuana    Comment: Last used: 2 months ago UDS NA    Home Medications Prior to Admission medications   Medication Sig Start Date End Date Taking? Authorizing Provider  ibuprofen (ADVIL) 600 MG tablet Take 1 tablet (600 mg total) by mouth every 6 (six) hours as needed. 03/15/21   Linwood Dibbles, MD  risperiDONE (RISPERDAL) 1 MG tablet Take 1 tablet (1 mg total) by mouth at bedtime. 10/15/21   Garlon Hatchet, PA-C  triamcinolone cream (KENALOG) 0.1 % Apply 1 application topically 2 (two) times daily. 05/16/21   Palumbo, April, MD  amantadine (SYMMETREL) 100 MG capsule Take 1 capsule (100 mg total) by mouth 2 (two) times daily. Patient not taking: Reported on 11/04/2019 09/01/19 11/17/19  [name], NP    Allergies    Haldol [haloperidol]  Review of Systems   Review of Systems  Constitutional: Negative.   HENT: Negative.    Respiratory: Negative.    Cardiovascular: Negative.   Gastrointestinal: Negative.   Genitourinary: Negative.   Musculoskeletal: Negative.   Skin: Negative.  Neurological: Negative.   Psychiatric/Behavioral:  Positive for hallucinations (chronic).   All other systems reviewed and are negative.  Physical Exam Updated Vital Signs BP 124/68    Pulse 67    Temp 99.4 F (37.4 C)    Resp 15    SpO2 98%   Physical Exam Vitals and nursing note reviewed.  Constitutional:      General: He is not in acute distress.    Appearance: He is well-developed. He is not ill-appearing, toxic-appearing or diaphoretic.  HENT:     Head: Normocephalic and atraumatic.     Nose: Nose normal.  Eyes:     Pupils: Pupils are  equal, round, and reactive to light.  Cardiovascular:     Rate and Rhythm: Normal rate and regular rhythm.     Pulses: Normal pulses.     Heart sounds: Normal heart sounds.  Pulmonary:     Effort: Pulmonary effort is normal. No respiratory distress.  Abdominal:     General: There is no distension.     Palpations: Abdomen is soft.  Musculoskeletal:        General: Normal range of motion.     Cervical back: Normal range of motion and neck supple.  Skin:    General: Skin is warm and dry.  Neurological:     General: No focal deficit present.     Mental Status: He is alert and oriented to person, place, and time.  Psychiatric:        Attention and Perception: He perceives auditory hallucinations.        Mood and Affect: Affect is flat.        Thought Content: Thought content is not paranoid or delusional. Thought content does not include homicidal or suicidal ideation. Thought content does not include homicidal or suicidal plan.    ED Results / Procedures / Treatments   Labs (all labs ordered are listed, but only abnormal results are displayed) Labs Reviewed - No data to display  EKG None  Radiology No results found.  Procedures Procedures   Medications Ordered in ED Medications  risperiDONE (RISPERDAL) tablet 1 mg (has no administration in time range)    ED Course  I have reviewed the triage vital signs and the nursing notes.  Pertinent labs & imaging results that were available during my care of the patient were reviewed by me and considered in my medical decision making (see chart for details).  Pleasant 35 year old, well-known to the emergency department presents for evaluation of his daily dose of Risperdal. Last dose > 24 hours PTA. He is afebrile, nonseptic, not ill-appearing.  Does admit to some intermittent audio hallucinations which are chronic for him and unchanged.  He denies any SI, HI. We will give him his home dose of medication here.  He does not appear to  be a threat to himself or others at this time.  Encourage follow-up outpatient.  He is agreeable.  The patient has been appropriately medically screened and/or stabilized in the ED. I have low suspicion for any other emergent medical condition which would require further screening, evaluation or treatment in the ED or require inpatient management.  Patient is hemodynamically stable and in no acute distress.  Patient able to ambulate in department prior to ED.  Evaluation does not show acute pathology that would require ongoing or additional emergent interventions while in the emergency department or further inpatient treatment.  I have discussed the diagnosis with the patient and answered all questions.  Pain  is been managed while in the emergency department and patient has no further complaints prior to discharge.  Patient is comfortable with plan discussed in room and is stable for discharge at this time.  I have discussed strict return precautions for returning to the emergency department.  Patient was encouraged to follow-up with PCP/specialist refer to at discharge.     MDM Rules/Calculators/A&P                            Final Clinical Impression(s) / ED Diagnoses Final diagnoses:  Medication refill    Rx / DC Orders ED Discharge Orders     None        Choua Ikner A, PA-C 11/10/21 0535    Veryl Speak, MD 11/10/21 [phone]

## 2021-11-10 NOTE — Discharge Instructions (Addendum)
Follow-up with your behavioral health team

## 2021-11-11 ENCOUNTER — Other Ambulatory Visit: Payer: Self-pay

## 2021-11-11 ENCOUNTER — Encounter (HOSPITAL_COMMUNITY): Payer: Self-pay | Admitting: *Deleted

## 2021-11-11 ENCOUNTER — Emergency Department (HOSPITAL_COMMUNITY)
Admission: EM | Admit: 2021-11-11 | Discharge: 2021-11-11 | Disposition: A | Discharge status: Home / Self Care | Payer: Self-pay | Attending: Emergency Medicine | Admitting: Emergency Medicine

## 2021-11-11 DIAGNOSIS — Z760 Encounter for issue of repeat prescription: Secondary | ICD-10-CM | POA: Insufficient documentation

## 2021-11-11 DIAGNOSIS — Z8616 Personal history of COVID-19: Secondary | ICD-10-CM | POA: Insufficient documentation

## 2021-11-11 DIAGNOSIS — R440 Auditory hallucinations: Secondary | ICD-10-CM | POA: Insufficient documentation

## 2021-11-11 DIAGNOSIS — F17210 Nicotine dependence, cigarettes, uncomplicated: Secondary | ICD-10-CM | POA: Insufficient documentation

## 2021-11-11 MED ORDER — RISPERIDONE 1 MG PO TABS
1.0000 mg | ORAL_TABLET | Freq: Once | ORAL | Status: AC
  Administered 2021-11-11: 1 mg via ORAL
  Filled 2021-11-11: qty 1

## 2021-11-11 NOTE — Progress Notes (Signed)
CSW contacted financial counseling and spoke with Princess Perna to see if patient would qualify for medicaid to cut down on the emergency room visits for medication. CSW also explained theres no contact information for patient once he leaves the hospital. Financial counseling agreed to screen patient to see if he qualifies. Patient does not meet the age requirements for medicaid but his mental health diagnosis might qualify him.

## 2021-11-11 NOTE — ED Provider Notes (Addendum)
[facility] EMERGENCY DEPARTMENT Provider Note   CSN: [id number] Arrival date & time: 11/11/21  0234     History Chief Complaint  Patient presents with   Hallucinations    Cole Barrett is a 35 y.o. male.  Patient back in this morning requesting respite all dose.  Which normally would be a bedtime dose.  But he seems to want to take it in the morning.  Patient has been seen frequently in the emergency department stating that he is homeless and needing his respite all.  Patient seen December 14 December 13 December 9 December 8 December 6 December 5 December 2.  Patient admits to some auditory hallucinations.  He has a history of mental disorder history of homelessness paranoid behavior and schizophrenia.  Patient asking for food and drink.  As well as his Risperdal.      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 No pertinent surgical history.     Family History  Problem Relation Age of Onset   Hypertension Mother     Social History   Tobacco Use   Smoking status: Every Day    Packs/day: 0.50     Types: Cigarettes   Smokeless tobacco: Never  Vaping Use   Vaping Use: Never used  Substance Use Topics   Alcohol use: Yes    Comment: BAC not available   Drug use: Yes    Types: Marijuana    Comment: Last used: 2 months ago UDS NA    Home Medications Prior to Admission medications   Medication Sig Start Date End Date Taking? Authorizing Provider  ibuprofen (ADVIL) 600 MG tablet Take 1 tablet (600 mg total) by mouth every 6 (six) hours as needed. 03/15/21   Linwood Dibbles, MD  risperiDONE (RISPERDAL) 1 MG tablet Take 1 tablet (1 mg total) by mouth at bedtime. 10/15/21   Garlon Hatchet, PA-C  triamcinolone cream (KENALOG) 0.1 % Apply 1 application topically 2 (two) times daily. 05/16/21   Palumbo, April, MD  amantadine (SYMMETREL) 100 MG capsule Take 1 capsule (100 mg total) by mouth 2 (two) times daily. Patient not taking: Reported on 11/04/2019 09/01/19 11/17/19  [name], NP    Allergies    Haldol [haloperidol]  Review of Systems   Review of Systems  Constitutional:  Negative for chills and fever.  HENT:  Negative for ear pain and sore throat.   Eyes:  Negative for pain and visual disturbance.  Respiratory:  Negative for cough and shortness of breath.   Cardiovascular:  Negative for chest pain and palpitations.  Gastrointestinal:  Negative for abdominal pain and  vomiting.  Genitourinary:  Negative for dysuria and hematuria.  Musculoskeletal:  Negative for arthralgias and back pain.  Skin:  Negative for color change and rash.  Neurological:  Negative for seizures and syncope.  Psychiatric/Behavioral:  Positive for hallucinations. Negative for suicidal ideas.   All other systems reviewed and are negative.  Physical Exam Updated Vital Signs BP 134/82 (BP Location: Left Arm)    Pulse 87    Temp 97.7 F (36.5 C) (Oral)    Resp 18    Ht 1.829 m (6')    Wt 108 kg    SpO2 95%    BMI 32.29 kg/m   Physical Exam Vitals and nursing note reviewed.  Constitutional:      General:  He is not in acute distress.    Appearance: Normal appearance. He is well-developed.  HENT:     Head: Normocephalic and atraumatic.  Eyes:     Conjunctiva/sclera: Conjunctivae normal.  Cardiovascular:     Rate and Rhythm: Normal rate and regular rhythm.     Heart sounds: No murmur heard. Pulmonary:     Effort: Pulmonary effort is normal. No respiratory distress.     Breath sounds: Normal breath sounds.  Abdominal:     Palpations: Abdomen is soft.     Tenderness: There is no abdominal tenderness.  Musculoskeletal:        General: No swelling.     Cervical back: Neck supple.  Skin:    General: Skin is warm and dry.     Capillary Refill: Capillary refill takes less than 2 seconds.  Neurological:     General: No focal deficit present.     Mental Status: He is alert.     Comments: Moving all 4 extremities.  Appears to be having some auditory hallucinations.  Fixated on getting his risperidone and food and drink.  Psychiatric:        Mood and Affect: Mood normal.    ED Results / Procedures / Treatments   Labs (all labs ordered are listed, but only abnormal results are displayed) Labs Reviewed - No data to display  EKG None  Radiology No results found.  Procedures Procedures   Medications Ordered in ED Medications  risperiDONE (RISPERDAL) tablet 1 mg (has no administration in time range)    ED Course  I have reviewed the triage vital signs and the nursing notes.  Pertinent labs & imaging results that were available during my care of the patient were reviewed by me and considered in my medical decision making (see chart for details).    MDM Rules/Calculators/A&P                           Patient denies any suicidal ideations.  Chart review shows that he has been here multiple times.  Case management has been consulted but I think they have ever made connection with him so ask him to see him again.  Patient is homeless.  Is pretty much been here every other day this  month.  Always requesting his medication but thinking that that may be an excuse so that he can get seen.  Have case management social worker get involved to see if there is any options for placement for him.  Patient seen by Education officer, museum.  Patient seems not to want any assistance. We tried to get him financial assistance we could sign up for Medicaid.  Patient refused.  Patient had his medication here today.  And had food.  Patient not suicidal.  Patient stable for discharge home.   Final Clinical Impression(s) / ED Diagnoses Final diagnoses:  Medication refill    Rx / DC Orders ED Discharge Orders     None        Fredia Sorrow, MD 11/11/21 [id number]    Fredia Sorrow, MD 11/11/21 [phone]

## 2021-11-11 NOTE — Progress Notes (Signed)
Financial counseling met with patient and he refused to sign the paperwork for his medicaid application.  °

## 2021-11-11 NOTE — Progress Notes (Signed)
CSW spoke with patient and asked if he needed any shelter resources. Patient stayed no and said he has a house. ([address] [place]). CSW asked patient who does he live with and he stated no one. Patient also told CSW that he works at [employer]. CSW asked patient if he job offers insurance and patient stated no. CSW asked patient if he goes to a specific pharmacy to get his medications. Patient could not answer the question and stated he goes everywhere. Patient stated he has no friends, family, or a phone number for anyone to get in contact with him. CSW observed patient to be clean with no odors.

## 2021-11-11 NOTE — Discharge Instructions (Signed)
Follow-up with [facility].  Return for any new or worse symptoms.

## 2021-11-11 NOTE — ED Triage Notes (Signed)
C/o hearing voices

## 2021-11-12 ENCOUNTER — Emergency Department (HOSPITAL_COMMUNITY)
Admission: EM | Admit: 2021-11-12 | Discharge: 2021-11-12 | Disposition: A | Discharge status: Home / Self Care | Payer: Self-pay | Attending: Emergency Medicine | Admitting: Emergency Medicine

## 2021-11-12 ENCOUNTER — Encounter (HOSPITAL_COMMUNITY): Payer: Self-pay

## 2021-11-12 DIAGNOSIS — Z79899 Other long term (current) drug therapy: Secondary | ICD-10-CM

## 2021-11-12 DIAGNOSIS — Z760 Encounter for issue of repeat prescription: Secondary | ICD-10-CM | POA: Insufficient documentation

## 2021-11-12 DIAGNOSIS — Z8616 Personal history of COVID-19: Secondary | ICD-10-CM | POA: Insufficient documentation

## 2021-11-12 DIAGNOSIS — F17210 Nicotine dependence, cigarettes, uncomplicated: Secondary | ICD-10-CM | POA: Insufficient documentation

## 2021-11-12 MED ORDER — RISPERIDONE 1 MG PO TABS
1.0000 mg | ORAL_TABLET | Freq: Once | ORAL | Status: AC
  Administered 2021-11-12: 1 mg via ORAL
  Filled 2021-11-12: qty 1

## 2021-11-12 NOTE — ED Provider Notes (Signed)
[name]-EMERGENCY DEPT Provider Note   CSN: [id number] Arrival date & time: 11/12/21  9798     History Chief Complaint  Patient presents with   Medication Refill    Cole Barrett is a 35 y.o. male.  The history is provided by the patient and medical records.  Medication Refill  35 year old male with history of homelessness, schizophrenia, presenting to the ED requesting dose of his Risperdal.  States he has not had it today but did have a dose yesterday in the ED.  Does admit to hearing voices.  Denies any suicidal or homicidal ideation.  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 No pertinent surgical history.     Family History  Problem Relation Age of Onset   Hypertension Mother     Social History   Tobacco Use   Smoking status: Every Day    Packs/day: 0.50    Types: Cigarettes   Smokeless tobacco: Never  Vaping Use   Vaping Use: Never used  Substance Use Topics   Alcohol use: Yes    Comment: BAC not available   Drug use: Yes    Types: Marijuana    Comment: Last used: 2 months  ago UDS NA    Home Medications Prior to Admission medications   Medication Sig Start Date End Date Taking? Authorizing Provider  ibuprofen (ADVIL) 600 MG tablet Take 1 tablet (600 mg total) by mouth every 6 (six) hours as needed. 03/15/21   Linwood Dibbles, MD  risperiDONE (RISPERDAL) 1 MG tablet Take 1 tablet (1 mg total) by mouth at bedtime. 10/15/21   Garlon Hatchet, PA-C  triamcinolone cream (KENALOG) 0.1 % Apply 1 application topically 2 (two) times daily. 05/16/21   Palumbo, April, MD  amantadine (SYMMETREL) 100 MG capsule Take 1 capsule (100 mg total) by mouth 2 (two) times daily. Patient not taking: Reported on 11/04/2019 09/01/19 11/17/19  [name], NP    Allergies    Haldol [haloperidol]  Review of Systems   Review of Systems  Psychiatric/Behavioral:  Positive for hallucinations.   All other systems reviewed and are negative.  Physical Exam Updated Vital Signs BP 132/83    Pulse 63    Temp 98 F (36.7 C) (Oral)    Resp 17    Ht 6' (1.829 m)    Wt 108 kg    SpO2 97%    BMI 32.29 kg/m   Physical Exam Vitals and nursing note reviewed.  Constitutional:      Appearance: He is well-developed.     Comments: Smells strongly of marijuana  HENT:  Head: Normocephalic and atraumatic.  Eyes:     Conjunctiva/sclera: Conjunctivae normal.     Pupils: Pupils are equal, round, and reactive to light.  Cardiovascular:     Rate and Rhythm: Normal rate and regular rhythm.     Heart sounds: Normal heart sounds.  Pulmonary:     Effort: Pulmonary effort is normal. No respiratory distress.     Breath sounds: Normal breath sounds. No rhonchi.  Abdominal:     General: Bowel sounds are normal.     Palpations: Abdomen is soft.  Musculoskeletal:        General: Normal range of motion.     Cervical back: Normal range of motion.  Skin:    General: Skin is warm and dry.  Neurological:     Mental Status: He is alert and oriented to person, place, and time.    ED Results / Procedures /  Treatments   Labs (all labs ordered are listed, but only abnormal results are displayed) Labs Reviewed - No data to display  EKG None  Radiology No results found.  Procedures Procedures   Medications Ordered in ED Medications  risperiDONE (RISPERDAL) tablet 1 mg (1 mg Oral Given 11/12/21 0405)    ED Course  I have reviewed the triage vital signs and the nursing notes.  Pertinent labs & imaging results that were available during my care of the patient were reviewed by me and considered in my medical decision making (see chart for details).    MDM Rules/Calculators/A&P  35 y.o. M here with hallucinations.  This is a chronic issue for him, requesting dose of his Risperdal.  He usually gets nightly dose from the ED, appears he did receive a dose yesterday evening.  Denies any complaints.  No SI/HI.  Given his Risperdal, stable for discharge home.  Can return here for new concerns.   Final Clinical Impression(s) / ED Diagnoses Final diagnoses:  Medication management    Rx / DC Orders ED Discharge Orders     None        Larene Pickett, PA-C 11/12/21 0415    Maudie Flakes, MD 11/12/21 [phone]

## 2021-11-12 NOTE — ED Triage Notes (Signed)
Patient arrives with c/o hearing voices and needing dose of Risperdal.

## 2021-11-13 ENCOUNTER — Other Ambulatory Visit: Payer: Self-pay

## 2021-11-13 ENCOUNTER — Emergency Department (HOSPITAL_COMMUNITY)
Admission: EM | Admit: 2021-11-13 | Discharge: 2021-11-13 | Disposition: A | Discharge status: Home / Self Care | Payer: Self-pay | Attending: Emergency Medicine | Admitting: Emergency Medicine

## 2021-11-13 DIAGNOSIS — Z760 Encounter for issue of repeat prescription: Secondary | ICD-10-CM | POA: Insufficient documentation

## 2021-11-13 DIAGNOSIS — Z5900 Homelessness unspecified: Secondary | ICD-10-CM | POA: Insufficient documentation

## 2021-11-13 DIAGNOSIS — F17210 Nicotine dependence, cigarettes, uncomplicated: Secondary | ICD-10-CM | POA: Insufficient documentation

## 2021-11-13 DIAGNOSIS — Z79899 Other long term (current) drug therapy: Secondary | ICD-10-CM

## 2021-11-13 DIAGNOSIS — Z8616 Personal history of COVID-19: Secondary | ICD-10-CM | POA: Insufficient documentation

## 2021-11-13 MED ORDER — RISPERIDONE 1 MG PO TABS
1.0000 mg | ORAL_TABLET | Freq: Once | ORAL | Status: AC
  Administered 2021-11-13: 1 mg via ORAL
  Filled 2021-11-13: qty 1

## 2021-11-13 NOTE — ED Provider Notes (Signed)
[facility]-EMERGENCY DEPT Provider Note   CSN: [id number] Arrival date & time: 11/13/21  6269     History Chief Complaint  Patient presents with   Medication Refill    Cole Barrett is a 35 y.o. male.   Medication Refill  35 year old male with history of homelessness and schizophrenia presents to ED requesting a dose of his Risperdal.  Reports he last received a dose of this medication yesterday in the emergency department.  He does admit to hearing voices intermittently but denies any suicidal or homicidal ideations.  Denies any acute pain or other complaints.  He has been seen several times for similar.    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     Family History  Problem Relation Age of Onset   Hypertension Mother     Social History   Tobacco Use   Smoking status: Every Day    Packs/day: 0.50    Types: Cigarettes   Smokeless tobacco: Never  Vaping Use   Vaping Use: Never used  Substance Use Topics   Alcohol use: Yes    Comment: BAC not available   Drug use: Yes     Types: Marijuana    Comment: Last used: 2 months ago UDS NA    Home Medications Prior to Admission medications   Medication Sig Start Date End Date Taking? Authorizing Provider  ibuprofen (ADVIL) 600 MG tablet Take 1 tablet (600 mg total) by mouth every 6 (six) hours as needed. 03/15/21   Linwood Dibbles, MD  risperiDONE (RISPERDAL) 1 MG tablet Take 1 tablet (1 mg total) by mouth at bedtime. 10/15/21   Garlon Hatchet, PA-C  triamcinolone cream (KENALOG) 0.1 % Apply 1 application topically 2 (two) times daily. 05/16/21   Palumbo, April, MD  amantadine (SYMMETREL) 100 MG capsule Take 1 capsule (100 mg total) by mouth 2 (two) times daily. Patient not taking: Reported on 11/04/2019 09/01/19 11/17/19  [name], NP    Allergies    Haldol [haloperidol]  Review of Systems   Review of Systems  Psychiatric/Behavioral:  Positive for hallucinations. Negative for suicidal ideas.   All other systems reviewed and are negative.  Physical Exam Updated Vital Signs BP (!) 134/102 (BP Location: Left Arm)    Pulse (!) 104    Temp 98.1 F (36.7 C) (Oral)    Resp 16    Ht 5\' 10"  (1.778 m)    Wt 95.3 kg    SpO2 95%    BMI 30.13 kg/m   Physical Exam Vitals and nursing note reviewed.  Constitutional:  General: He is not in acute distress.    Appearance: Normal appearance. He is well-developed. He is not ill-appearing or diaphoretic.  HENT:     Head: Normocephalic and atraumatic.  Eyes:     General:        Right eye: No discharge.        Left eye: No discharge.  Cardiovascular:     Rate and Rhythm: Normal rate and regular rhythm.  Pulmonary:     Effort: Pulmonary effort is normal. No respiratory distress.  Neurological:     Mental Status: He is alert and oriented to person, place, and time.     Coordination: Coordination normal.  Psychiatric:        Behavior: Behavior normal.        Thought Content: Thought content does not include homicidal or suicidal ideation.     Comments: Patient is  not currently responding to internal stimuli.    ED Results / Procedures / Treatments   Labs (all labs ordered are listed, but only abnormal results are displayed) Labs Reviewed - No data to display  EKG None  Radiology No results found.  Procedures Procedures   Medications Ordered in ED Medications  risperiDONE (RISPERDAL) tablet 1 mg (has no administration in time range)    ED Course  I have reviewed the triage vital signs and the nursing notes.  Pertinent labs & imaging results that were available during my care of the patient were reviewed by me and considered in my medical decision making (see chart for details).    MDM Rules/Calculators/A&P                          Patient here with hallucinations and requesting a dose of his Risperdal.  This is a chronic issue for the patient and he typically comes to the ED nightly to receive a dose of medication.  Last received a dose in the emergency department yesterday.  Patient denies any other complaints.  No SI or HI.  Given his Risperdal and stable for discharge.   Final Clinical Impression(s) / ED Diagnoses Final diagnoses:  Medication management    Rx / DC Orders ED Discharge Orders     None        Jacqlyn Larsen, [place] 11/13/21 0447    Quintella Reichert, MD 11/13/21 [phone]

## 2021-11-13 NOTE — ED Triage Notes (Signed)
Patient arrives requesting his risperdal

## 2021-11-15 ENCOUNTER — Other Ambulatory Visit: Payer: Self-pay

## 2021-11-15 DIAGNOSIS — Z760 Encounter for issue of repeat prescription: Secondary | ICD-10-CM | POA: Insufficient documentation

## 2021-11-15 DIAGNOSIS — F17210 Nicotine dependence, cigarettes, uncomplicated: Secondary | ICD-10-CM | POA: Insufficient documentation

## 2021-11-15 DIAGNOSIS — Z8616 Personal history of COVID-19: Secondary | ICD-10-CM | POA: Insufficient documentation

## 2021-11-16 ENCOUNTER — Other Ambulatory Visit: Payer: Self-pay

## 2021-11-16 ENCOUNTER — Emergency Department (HOSPITAL_COMMUNITY)
Admission: EM | Admit: 2021-11-16 | Discharge: 2021-11-16 | Disposition: A | Discharge status: Home / Self Care | Payer: Self-pay | Attending: Emergency Medicine | Admitting: Emergency Medicine

## 2021-11-16 DIAGNOSIS — Z79899 Other long term (current) drug therapy: Secondary | ICD-10-CM

## 2021-11-16 MED ORDER — RISPERIDONE 1 MG PO TABS
1.0000 mg | ORAL_TABLET | Freq: Once | ORAL | Status: AC
  Administered 2021-11-16: 01:00:00 1 mg via ORAL
  Filled 2021-11-16: qty 1

## 2021-11-16 NOTE — ED Provider Notes (Addendum)
[facility] EMERGENCY DEPARTMENT Provider Note   CSN: [id number] Arrival date & time: 11/15/21  2324     History Chief Complaint  Patient presents with   Medication Refill    Cole Barrett is a 35 y.o. male.  The history is provided by the patient and medical records.  Medication Refill  35 y.o. M here requesting dose of his risperdal.  Well known to this facility for same.  Last had meds 11/13/21, given from ED.  Admits to hallucinations which are chronic.  Denies SI/HI.  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     Family History  Problem Relation Age of Onset   Hypertension Mother     Social History   Tobacco Use   Smoking status: Every Day    Packs/day: 0.50    Types: Cigarettes   Smokeless tobacco: Never  Vaping Use   Vaping Use: Never used  Substance Use Topics   Alcohol use: Yes    Comment: BAC not available   Drug use: Yes    Types: Marijuana    Comment: Last used: 2 months ago UDS NA    Home Medications Prior to Admission medications   Medication Sig  Start Date End Date Taking? Authorizing Provider  ibuprofen (ADVIL) 600 MG tablet Take 1 tablet (600 mg total) by mouth every 6 (six) hours as needed. 03/15/21   Linwood Dibbles, MD  risperiDONE (RISPERDAL) 1 MG tablet Take 1 tablet (1 mg total) by mouth at bedtime. 10/15/21   Garlon Hatchet, PA-C  triamcinolone cream (KENALOG) 0.1 % Apply 1 application topically 2 (two) times daily. 05/16/21   Palumbo, April, MD  amantadine (SYMMETREL) 100 MG capsule Take 1 capsule (100 mg total) by mouth 2 (two) times daily. Patient not taking: Reported on 11/04/2019 09/01/19 11/17/19  [name], NP    Allergies    Haldol [haloperidol]  Review of Systems   Review of Systems  Psychiatric/Behavioral:  Positive for hallucinations (chronic).   All other systems reviewed and are negative.  Physical Exam Updated Vital Signs BP 120/64    Pulse 90    Temp 98.7 F (37.1 C) (Oral)    Resp 18    Ht 5\' 10"  (1.778 m)    Wt 99.8 kg    SpO2 99%    BMI 31.57 kg/m   Physical Exam Vitals and nursing note reviewed.  Constitutional:      Appearance: He is well-developed.  HENT:     Head: Normocephalic and atraumatic.  Eyes:     Conjunctiva/sclera: Conjunctivae normal.  Pupils: Pupils are equal, round, and reactive to light.  Cardiovascular:     Rate and Rhythm: Normal rate and regular rhythm.     Heart sounds: Normal heart sounds.  Pulmonary:     Effort: Pulmonary effort is normal.     Breath sounds: Normal breath sounds.  Abdominal:     General: Bowel sounds are normal.     Palpations: Abdomen is soft.  Musculoskeletal:        General: Normal range of motion.     Cervical back: Normal range of motion.  Skin:    General: Skin is warm and dry.  Neurological:     Mental Status: He is alert and oriented to person, place, and time.  Psychiatric:     Comments: At baseline Not responding to internal stimuli, denies SI/HI    ED Results / Procedures / Treatments   Labs (all labs ordered are listed, but  only abnormal results are displayed) Labs Reviewed - No data to display  EKG None  Radiology No results found.  Procedures Procedures   Medications Ordered in ED Medications  risperiDONE (RISPERDAL) tablet 1 mg (has no administration in time range)    ED Course  I have reviewed the triage vital signs and the nursing notes.  Pertinent labs & imaging results that were available during my care of the patient were reviewed by me and considered in my medical decision making (see chart for details).    MDM Rules/Calculators/A&P                         35 year old male here requesting dose of Risperdal.  He is well-known to this facility for same.  Denies any SI or HI.  Vitals are stable.  Given risperdal in triage, stable for discharge.  Final Clinical Impression(s) / ED Diagnoses Final diagnoses:  Medication management    Rx / DC Orders ED Discharge Orders     None        Larene Pickett, PA-C 11/16/21 0128    Larene Pickett, PA-C 11/16/21 0132    Merryl Hacker, MD 11/16/21 [phone]

## 2021-11-16 NOTE — ED Triage Notes (Signed)
Pt states he is out of his risperdal and needs more medication. Is experiencing auditory hallucinations but denies SI/HI at this time.

## 2021-11-18 ENCOUNTER — Encounter (HOSPITAL_COMMUNITY): Payer: Self-pay

## 2021-11-18 ENCOUNTER — Other Ambulatory Visit: Payer: Self-pay

## 2021-11-18 ENCOUNTER — Emergency Department (HOSPITAL_COMMUNITY)
Admission: EM | Admit: 2021-11-18 | Discharge: 2021-11-18 | Disposition: A | Discharge status: Home / Self Care | Payer: Self-pay | Attending: Emergency Medicine | Admitting: Emergency Medicine

## 2021-11-18 DIAGNOSIS — F17210 Nicotine dependence, cigarettes, uncomplicated: Secondary | ICD-10-CM | POA: Insufficient documentation

## 2021-11-18 DIAGNOSIS — Z760 Encounter for issue of repeat prescription: Secondary | ICD-10-CM | POA: Insufficient documentation

## 2021-11-18 DIAGNOSIS — Z8616 Personal history of COVID-19: Secondary | ICD-10-CM | POA: Insufficient documentation

## 2021-11-18 MED ORDER — RISPERIDONE 1 MG PO TABS
1.0000 mg | ORAL_TABLET | Freq: Once | ORAL | Status: AC
  Administered 2021-11-18: 07:00:00 1 mg via ORAL
  Filled 2021-11-18: qty 1

## 2021-11-18 NOTE — ED Provider Notes (Signed)
[facility] DEPT Provider Note   CSN: [id number] Arrival date & time: 11/18/21  0055     History Chief Complaint  Patient presents with   Hallucinations    Cole Barrett is a 35 y.o. male.  The history is provided by the patient and medical records. No language interpreter was used.   35 year old male significant history of paranoia, schizophrenia, homelessness, presenting requesting for a dose of his Risperdal.  Initial triage note mentioned that patient was hearing voices telling him to kill himself but denies SH.  When I assessed patient he said that he would like to be able to sit in her room and rest for about 20 to 30 minutes and to have a dose of his Risperdal but he denies any ongoing SI HI.  No other complaint.  Patient is well-known to the ED who presents multiple times for similar presentation.  Past Medical History:  Diagnosis Date   COVID-19    Homelessness    Hypercholesterolemia    Mental disorder    Paranoid behavior ([place])    PE (pulmonary embolism)    Schizophrenia ([place])     Patient Active Problem List   Diagnosis Date Noted   Acute psychosis ([place]) 08/31/2019   Schizophrenia ([place]) 08/31/2019   Schizoaffective disorder, bipolar type ([place]) 01/19/2019   Alcohol abuse    Auditory hallucinations    Alcohol abuse with alcohol-induced mood disorder ([place]) 09/08/2016   Homelessness 09/03/2016   Person feigning illness 09/03/2016   Brief psychotic disorder ([place]) 08/29/2016   Cannabis use disorder, mild, abuse 08/29/2016   Pulmonary emboli ([place]) 07/13/2016   Adjustment disorder with emotional disturbance 03/12/2014   Pulmonary embolism and infarction ([place]) 01/02/2014   Acute left flank pain 01/02/2014   Pulmonary embolism, bilateral ([place]) 01/02/2014    History reviewed. No pertinent surgical history.     Family History  Problem Relation Age of Onset   Hypertension Mother     Social History   Tobacco Use    Smoking status: Every Day    Packs/day: 0.50    Types: Cigarettes   Smokeless tobacco: Never  Vaping Use   Vaping Use: Never used  Substance Use Topics   Alcohol use: Yes    Comment: BAC not available   Drug use: Yes    Types: Marijuana    Comment: Last used: 2 months ago UDS NA    Home Medications Prior to Admission medications   Medication Sig Start Date End Date Taking? Authorizing Provider  ibuprofen (ADVIL) 600 MG tablet Take 1 tablet (600 mg total) by mouth every 6 (six) hours as needed. 03/15/21   Dorie Rank, MD  risperiDONE (RISPERDAL) 1 MG tablet Take 1 tablet (1 mg total) by mouth at bedtime. 10/15/21   Larene Pickett, PA-C  triamcinolone cream (KENALOG) 0.1 % Apply 1 application topically 2 (two) times daily. 05/16/21   Palumbo, April, MD  amantadine (SYMMETREL) 100 MG capsule Take 1 capsule (100 mg total) by mouth 2 (two) times daily. Patient not taking: Reported on 11/04/2019 09/01/19 11/17/19  Mallie Darting, NP    Allergies    Haldol [haloperidol]  Review of Systems   Review of Systems  Constitutional:  Negative for fever.  Neurological:  Negative for headaches.  Psychiatric/Behavioral:  Negative for suicidal ideas.   All other systems reviewed and are negative.  Physical Exam Updated Vital Signs BP (!) 149/84 (BP Location: Right Arm)    Pulse 85    Temp 97.8  F (36.6 C) (Oral)    Resp 16    Ht 5\' 10"  (1.778 m)    Wt 99.8 kg    SpO2 95%    BMI 31.57 kg/m   Physical Exam Vitals and nursing note reviewed.  Constitutional:      General: He is not in acute distress.    Appearance: He is well-developed.  HENT:     Head: Atraumatic.  Eyes:     Conjunctiva/sclera: Conjunctivae normal.  Cardiovascular:     Rate and Rhythm: Normal rate and regular rhythm.     Pulses: Normal pulses.     Heart sounds: Normal heart sounds.  Pulmonary:     Effort: Pulmonary effort is normal.     Breath sounds: Normal breath sounds.  Abdominal:     Palpations: Abdomen is soft.   Musculoskeletal:     Cervical back: Neck supple.  Skin:    Findings: No rash.  Neurological:     Mental Status: He is alert. Mental status is at baseline.  Psychiatric:        Mood and Affect: Mood normal.        Speech: Speech normal.        Behavior: Behavior is cooperative.        Thought Content: Thought content does not include homicidal or suicidal ideation.    ED Results / Procedures / Treatments   Labs (all labs ordered are listed, but only abnormal results are displayed) Labs Reviewed  COMPREHENSIVE METABOLIC PANEL  ETHANOL  CBC WITH DIFFERENTIAL/PLATELET  RAPID URINE DRUG SCREEN, HOSP PERFORMED    EKG None  Radiology No results found.  Procedures Procedures   Medications Ordered in ED Medications - No data to display  ED Course  I have reviewed the triage vital signs and the nursing notes.  Pertinent labs & imaging results that were available during my care of the patient were reviewed by me and considered in my medical decision making (see chart for details).    MDM Rules/Calculators/A&P                         BP (!) 142/78    Pulse 89    Temp 97.8 F (36.6 C) (Oral)    Resp 18    Ht 5\' 10"  (1.778 m)    Wt 99.8 kg    SpO2 96%    BMI 31.57 kg/m      Final Clinical Impression(s) / ED Diagnoses Final diagnoses:  Encounter for medication refill    Rx / DC Orders ED Discharge Orders     None      Patient request for dose of his Risperdal.  He has been seen in the ED multiple times for similar presentation.  Denies any active SI or HI.   , PA-C 11/18/21 Fayrene Helper    11/20/21, MD 11/18/21 [phone]

## 2021-11-18 NOTE — ED Triage Notes (Signed)
Pt reports he is hearing voices to tell him to kill himself but denies SI. He states he took his meds this morning.

## 2021-11-22 ENCOUNTER — Other Ambulatory Visit: Payer: Self-pay

## 2021-11-22 ENCOUNTER — Emergency Department (HOSPITAL_COMMUNITY)
Admission: EM | Admit: 2021-11-22 | Discharge: 2021-11-22 | Disposition: A | Discharge status: Home / Self Care | Payer: Self-pay | Attending: Emergency Medicine | Admitting: Emergency Medicine

## 2021-11-22 ENCOUNTER — Encounter (HOSPITAL_COMMUNITY): Payer: Self-pay | Admitting: Emergency Medicine

## 2021-11-22 DIAGNOSIS — Z8616 Personal history of COVID-19: Secondary | ICD-10-CM | POA: Insufficient documentation

## 2021-11-22 DIAGNOSIS — X58XXXA Exposure to other specified factors, initial encounter: Secondary | ICD-10-CM | POA: Insufficient documentation

## 2021-11-22 DIAGNOSIS — S30811A Abrasion of abdominal wall, initial encounter: Secondary | ICD-10-CM | POA: Insufficient documentation

## 2021-11-22 DIAGNOSIS — F17210 Nicotine dependence, cigarettes, uncomplicated: Secondary | ICD-10-CM | POA: Insufficient documentation

## 2021-11-22 DIAGNOSIS — T148XXA Other injury of unspecified body region, initial encounter: Secondary | ICD-10-CM

## 2021-11-22 MED ORDER — MUPIROCIN CALCIUM 2 % EX CREA: 1.0000 "application " | TOPICAL_CREAM | Freq: Two times a day (BID) | CUTANEOUS | 0 refills | Status: DC

## 2021-11-22 MED ORDER — BACITRACIN ZINC 500 UNIT/GM EX OINT: TOPICAL_OINTMENT | Freq: Once | CUTANEOUS | Status: AC

## 2021-11-22 NOTE — ED Triage Notes (Signed)
Pt requesting prescription ointment for minor abrasion to left side. No signs/sx of infection. VSS.

## 2021-11-22 NOTE — ED Provider Notes (Signed)
[facility] EMERGENCY DEPARTMENT Provider Note   CSN: [id number] Arrival date & time: 11/22/21  0402     History Chief Complaint  Patient presents with   Abrasion    Cole Barrett is a 35 y.o. male.  35 yo M with a chief complaint of a wound to his left side.  This is been there for a few months or so.  He is not sure exactly what caused it.  He would like for it to heal and so would like a cream to be able to apply to it.  He denies any obvious injury to it.  I asked him specifically if it was due to a dog bite or human bites and he tells me no.   The history is provided by the patient.  Illness Severity:  Moderate Onset quality:  Gradual Duration:  2 days Timing:  Constant Progression:  Worsening Chronicity:  New Associated symptoms: no abdominal pain, no chest pain, no congestion, no diarrhea, no fever, no headaches, no myalgias, no rash, no shortness of breath and no vomiting       Past Medical History:  Diagnosis Date   COVID-19    Homelessness    Hypercholesterolemia    Mental disorder    Paranoid behavior ([place])    PE (pulmonary embolism)    Schizophrenia ([place])     Patient Active Problem List   Diagnosis Date Noted   Acute psychosis ([place]) 08/31/2019   Schizophrenia ([place]) 08/31/2019   Schizoaffective disorder, bipolar type ([place]) 01/19/2019   Alcohol abuse    Auditory hallucinations    Alcohol abuse with alcohol-induced mood disorder ([place]) 09/08/2016   Homelessness 09/03/2016   Person feigning illness 09/03/2016   Brief psychotic disorder ([place]) 08/29/2016   Cannabis use disorder, mild, abuse 08/29/2016   Pulmonary emboli ([place]) 07/13/2016   Adjustment disorder with emotional disturbance 03/12/2014   Pulmonary embolism and infarction ([place]) 01/02/2014   Acute left flank pain 01/02/2014   Pulmonary embolism, bilateral ([place]) 01/02/2014    History reviewed. No pertinent surgical history.     Family History  Problem Relation  Age of Onset   Hypertension Mother     Social History   Tobacco Use   Smoking status: Every Day    Packs/day: 0.50    Types: Cigarettes   Smokeless tobacco: Never  Vaping Use   Vaping Use: Never used  Substance Use Topics   Alcohol use: Yes    Comment: BAC not available   Drug use: Yes    Types: Marijuana    Comment: Last used: 2 months ago UDS NA    Home Medications Prior to Admission medications   Medication Sig Start Date End Date Taking? Authorizing Provider  mupirocin cream (BACTROBAN) 2 % Apply 1 application topically 2 (two) times daily. 11/22/21  Yes Deno Etienne, DO  ibuprofen (ADVIL) 600 MG tablet Take 1 tablet (600 mg total) by mouth every 6 (six) hours as needed. 03/15/21   Dorie Rank, MD  risperiDONE (RISPERDAL) 1 MG tablet Take 1 tablet (1 mg total) by mouth at bedtime. 10/15/21   Larene Pickett, PA-C  triamcinolone cream (KENALOG) 0.1 % Apply 1 application topically 2 (two) times daily. 05/16/21   Palumbo, April, MD  amantadine (SYMMETREL) 100 MG capsule Take 1 capsule (100 mg total) by mouth 2 (two) times daily. Patient not taking: Reported on 11/04/2019 09/01/19 11/17/19  Mallie Darting, NP    Allergies    Haldol [haloperidol]  Review of  Systems   Review of Systems  Constitutional:  Negative for chills and fever.  HENT:  Negative for congestion and facial swelling.   Eyes:  Negative for discharge and visual disturbance.  Respiratory:  Negative for shortness of breath.   Cardiovascular:  Negative for chest pain and palpitations.  Gastrointestinal:  Negative for abdominal pain, diarrhea and vomiting.  Musculoskeletal:  Negative for arthralgias and myalgias.  Skin:  Positive for wound. Negative for color change and rash.  Neurological:  Negative for tremors, syncope and headaches.  Psychiatric/Behavioral:  Negative for confusion and dysphoric mood.    Physical Exam Updated Vital Signs BP 124/87 (BP Location: Right Arm)    Pulse 75    Temp 98 F (36.7 C)  (Oral)    Resp 20    SpO2 96%   Physical Exam Vitals and nursing note reviewed.  Constitutional:      Appearance: He is well-developed.  HENT:     Head: Normocephalic and atraumatic.  Eyes:     Pupils: Pupils are equal, round, and reactive to light.  Neck:     Vascular: No JVD.  Cardiovascular:     Rate and Rhythm: Normal rate and regular rhythm.     Heart sounds: No murmur heard.   No friction rub. No gallop.  Pulmonary:     Effort: No respiratory distress.     Breath sounds: No wheezing.  Abdominal:     General: There is no distension.     Tenderness: There is no abdominal tenderness. There is no guarding or rebound.  Musculoskeletal:        General: Normal range of motion.     Cervical back: Normal range of motion and neck supple.     Comments: Old appearing wound to the left flank.  Some excoriations.  No erythema no drainage no fluctuance.  Skin:    Coloration: Skin is not pale.     Findings: No rash.  Neurological:     Mental Status: He is alert and oriented to person, place, and time.  Psychiatric:        Behavior: Behavior normal.    ED Results / Procedures / Treatments   Labs (all labs ordered are listed, but only abnormal results are displayed) Labs Reviewed - No data to display  EKG None  Radiology No results found.  Procedures Procedures   Medications Ordered in ED Medications  bacitracin ointment (has no administration in time range)    ED Course  I have reviewed the triage vital signs and the nursing notes.  Pertinent labs & imaging results that were available during my care of the patient were reviewed by me and considered in my medical decision making (see chart for details).    MDM Rules/Calculators/A&P                         35 yo M with a chief complaints of a wound to his left side.  I am not sure why this is having trouble healing though the patient does rub it fairly frequently while I am discussing it with him.  Has been there for  multiple months.  We will try a round of mupirocin though seems unlikely it is acutely infected.  I suggest that he follow-up with his family doctor for continued evaluation and expected evolution.  8:43 AM:  I have discussed the diagnosis/risks/treatment options with the patient and believe the pt to be eligible for discharge home to follow-up  with PCP. We also discussed returning to the ED immediately if new or worsening sx occur. We discussed the sx which are most concerning (e.g., sudden worsening pain, fever, inability to tolerate by mouth) that necessitate immediate return. Medications administered to the patient during their visit and any new prescriptions provided to the patient are listed below.  Medications given during this visit Medications  bacitracin ointment (has no administration in time range)     The patient appears reasonably screen and/or stabilized for discharge and I doubt any other medical condition or other [facility] requiring further screening, evaluation, or treatment in the ED at this time prior to discharge.      Final Clinical Impression(s) / ED Diagnoses Final diagnoses:  Abrasion    Rx / DC Orders ED Discharge Orders          Ordered    mupirocin cream (BACTROBAN) 2 %  2 times daily        11/22/21 0822             Melene Plan, DO 11/22/21 [phone]

## 2021-11-22 NOTE — Discharge Instructions (Signed)
Try not to mess with your wound.  Try to keep it clean and dry.  I have prescribed for you an antibiotic ointment.  I am not convinced that it is infected and so not sure that it will help you.  It is probably more important that you keep it from having recurrent injury.  Please follow-up with your doctor in the office.  Return for rapid spreading redness or if you develop a fever.

## 2021-11-24 ENCOUNTER — Encounter (HOSPITAL_COMMUNITY): Payer: Self-pay | Admitting: Emergency Medicine

## 2021-11-24 ENCOUNTER — Emergency Department (HOSPITAL_COMMUNITY)
Admission: EM | Admit: 2021-11-24 | Discharge: 2021-11-24 | Disposition: A | Discharge status: Home / Self Care | Payer: Self-pay | Attending: Emergency Medicine | Admitting: Emergency Medicine

## 2021-11-24 ENCOUNTER — Other Ambulatory Visit: Payer: Self-pay

## 2021-11-24 DIAGNOSIS — Z760 Encounter for issue of repeat prescription: Secondary | ICD-10-CM

## 2021-11-24 DIAGNOSIS — Z8616 Personal history of COVID-19: Secondary | ICD-10-CM | POA: Insufficient documentation

## 2021-11-24 DIAGNOSIS — F17210 Nicotine dependence, cigarettes, uncomplicated: Secondary | ICD-10-CM | POA: Insufficient documentation

## 2021-11-24 MED ORDER — BACITRACIN ZINC 500 UNIT/GM EX OINT: 1.0000 "application " | TOPICAL_OINTMENT | Freq: Two times a day (BID) | CUTANEOUS | 0 refills | Status: DC

## 2021-11-24 NOTE — ED Triage Notes (Signed)
Patient requesting Bacitracin Ointment .

## 2021-11-24 NOTE — ED Provider Notes (Signed)
[name] EMERGENCY DEPARTMENT Provider Note   CSN: [id number] Arrival date & time: 11/24/21  0158     History No chief complaint on file.   Cole Barrett is a 35 y.o. male with a past medical history significant for homelessness, history of PE, and schizophrenia who presents to the ED requesting a medication refill on his bacitracin ointment.  Patient states he lost his paperwork from his previous ED visit that had his prescription for bacitracin.  Patient states he has had a cut on his left side for quite some time and has been placing bacitracin on it with improvement in symptoms.  No fever or chills.  No purulent drainage.  No surrounding erythema.   History obtained from patient and past medical records. No interpreter used during encounter.       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 No pertinent surgical history.     Family History  Problem Relation Age of Onset   Hypertension Mother     Social History   Tobacco Use   Smoking status: Every Day    Packs/day: 0.50    Types: Cigarettes    Smokeless tobacco: Never  Vaping Use   Vaping Use: Never used  Substance Use Topics   Alcohol use: Yes    Comment: BAC not available   Drug use: Yes    Types: Marijuana    Comment: Last used: 2 months ago UDS NA    Home Medications Prior to Admission medications   Medication Sig Start Date End Date Taking? Authorizing Provider  bacitracin ointment Apply 1 application topically 2 (two) times daily. 11/24/21  Yes Yanelle Sousa C, PA-C  ibuprofen (ADVIL) 600 MG tablet Take 1 tablet (600 mg total) by mouth every 6 (six) hours as needed. 03/15/21   Linwood Dibbles, MD  mupirocin cream (BACTROBAN) 2 % Apply 1 application topically 2 (two) times daily. 11/22/21   Melene Plan, DO  risperiDONE (RISPERDAL) 1 MG tablet Take 1 tablet (1 mg total) by mouth at bedtime. 10/15/21   Garlon Hatchet, PA-C  triamcinolone cream (KENALOG) 0.1 % Apply 1 application topically 2 (two) times daily. 05/16/21   Palumbo, April, MD  amantadine (SYMMETREL) 100 MG capsule Take 1 capsule (100 mg total) by mouth 2 (two) times daily. Patient not taking: Reported on 11/04/2019 09/01/19 11/17/19  [name], NP    Allergies    Haldol [haloperidol]  Review of Systems   Review of Systems  Constitutional:  Negative for chills and fever.  Skin:  Positive for wound.  All other systems reviewed and are negative.  Physical Exam  Updated Vital Signs BP 126/75 (BP Location: Right Arm)    Pulse 85    Temp 97.8 F (36.6 C) (Oral)    Resp 17    SpO2 99%   Physical Exam Vitals and nursing note reviewed.  Constitutional:      General: He is not in acute distress.    Appearance: He is not ill-appearing.  HENT:     Head: Normocephalic.  Eyes:     Pupils: Pupils are equal, round, and reactive to light.  Cardiovascular:     Rate and Rhythm: Normal rate and regular rhythm.     Pulses: Normal pulses.     Heart sounds: Normal heart sounds. No murmur heard.   No friction rub. No gallop.  Pulmonary:     Effort: Pulmonary  effort is normal.     Breath sounds: Normal breath sounds.  Abdominal:     General: Abdomen is flat. There is no distension.     Palpations: Abdomen is soft.     Tenderness: There is no abdominal tenderness. There is no guarding or rebound.  Musculoskeletal:        General: Normal range of motion.     Cervical back: Neck supple.  Skin:    General: Skin is warm and dry.     Comments: Small cut to left flank region with no drainage or surrounding erythema.  Neurological:     General: No focal deficit present.     Mental Status: He is alert.  Psychiatric:        Mood and Affect: Mood normal.        Behavior: Behavior normal.    ED Results / Procedures / Treatments   Labs (all labs ordered are listed, but only abnormal results are displayed) Labs Reviewed - No data to display  EKG None  Radiology No results found.  Procedures Procedures   Medications Ordered in ED Medications - No data to display  ED Course  I have reviewed the triage vital signs and the nursing notes.  Pertinent labs & imaging results that were available during my care of the patient were reviewed by me and considered in my medical decision making (see chart for details).    MDM Rules/Calculators/A&P                         35 year old male presents the ED requesting a refill on his bacitracin ointment.  Patient lost his previous ED paperwork that had his prescription on it.  Patient has a cut on his left side which he has had for some time now.  No signs of infection.  Vitals within normal limits.  Bacitracin placed on cut here in the ED and patient's prescription refilled. Strict ED precautions discussed with patient. Patient states understanding and agrees to plan. Patient discharged home in no acute distress and stable vitals     Final Clinical Impression(s) / ED Diagnoses Final diagnoses:  Medication refill    Rx / DC Orders ED Discharge Orders          Ordered    bacitracin ointment  2  times daily        11/24/21 1037             Karie Kirks 11/24/21 1039    Wyvonnia Dusky, MD 11/24/21 1131

## 2021-11-24 NOTE — Discharge Instructions (Signed)
It was a pleasure taking care of you today. I have refilled your Bacitracin ointment. Use as needed. Follow-up with PCP if symptoms do not improve over the next week. Return to the ER for new or worsening symptoms.

## 2021-11-24 NOTE — ED Notes (Signed)
Unable to locate multiple times by staff/RN .

## 2021-11-26 ENCOUNTER — Emergency Department (HOSPITAL_COMMUNITY)
Admission: EM | Admit: 2021-11-26 | Discharge: 2021-11-26 | Disposition: A | Discharge status: Home / Self Care | Payer: Self-pay | Attending: Emergency Medicine | Admitting: Emergency Medicine

## 2021-11-26 ENCOUNTER — Other Ambulatory Visit: Payer: Self-pay

## 2021-11-26 ENCOUNTER — Encounter (HOSPITAL_COMMUNITY): Payer: Self-pay

## 2021-11-26 DIAGNOSIS — Z8616 Personal history of COVID-19: Secondary | ICD-10-CM | POA: Insufficient documentation

## 2021-11-26 DIAGNOSIS — X58XXXA Exposure to other specified factors, initial encounter: Secondary | ICD-10-CM | POA: Insufficient documentation

## 2021-11-26 DIAGNOSIS — Z79899 Other long term (current) drug therapy: Secondary | ICD-10-CM | POA: Insufficient documentation

## 2021-11-26 DIAGNOSIS — F17210 Nicotine dependence, cigarettes, uncomplicated: Secondary | ICD-10-CM | POA: Insufficient documentation

## 2021-11-26 DIAGNOSIS — S30811A Abrasion of abdominal wall, initial encounter: Secondary | ICD-10-CM | POA: Insufficient documentation

## 2021-11-26 DIAGNOSIS — Z760 Encounter for issue of repeat prescription: Secondary | ICD-10-CM | POA: Insufficient documentation

## 2021-11-26 DIAGNOSIS — Z5181 Encounter for therapeutic drug level monitoring: Secondary | ICD-10-CM

## 2021-11-26 MED ORDER — RISPERIDONE 0.5 MG PO TABS: 0.5000 mg | ORAL_TABLET | Freq: Once | ORAL | Status: DC

## 2021-11-26 MED ORDER — RISPERIDONE 1 MG PO TABS
1.0000 mg | ORAL_TABLET | Freq: Once | ORAL | Status: AC
  Administered 2021-11-26: 09:00:00 1 mg via ORAL
  Filled 2021-11-26: qty 1

## 2021-11-26 MED ORDER — BACITRACIN ZINC 500 UNIT/GM EX OINT
TOPICAL_OINTMENT | Freq: Two times a day (BID) | CUTANEOUS | Status: DC
  Filled 2021-11-26: qty 0.9

## 2021-11-26 NOTE — ED Provider Notes (Signed)
Emergency Department Provider Note   I have reviewed the triage vital signs and the nursing notes.   HISTORY  Chief Complaint Medication Refill   HPI Cole Barrett is a 35 y.o. male with PMH reviewed below presents to the ED with request for refill of bacitracin. He has a sore to the left flank and has been applying bacitracin to the area. Denies fever or worsening rash. No chills. Reports that he is taking his psychiatry meds. Denies any SI/HI.    Past Medical History:  Diagnosis Date   COVID-19    Homelessness    Hypercholesterolemia    Mental disorder    Paranoid behavior ([place])    PE (pulmonary embolism)    Schizophrenia ([place])     Patient Active Problem List   Diagnosis Date Noted   Acute psychosis ([place]) 08/31/2019   Schizophrenia ([place]) 08/31/2019   Schizoaffective disorder, bipolar type ([place]) 01/19/2019   Alcohol abuse    Auditory hallucinations    Alcohol abuse with alcohol-induced mood disorder ([place]) 09/08/2016   Homelessness 09/03/2016   Person feigning illness 09/03/2016   Brief psychotic disorder ([place]) 08/29/2016   Cannabis use disorder, mild, abuse 08/29/2016   Pulmonary emboli ([place]) 07/13/2016   Adjustment disorder with emotional disturbance 03/12/2014   Pulmonary embolism and infarction ([place]) 01/02/2014   Acute left flank pain 01/02/2014   Pulmonary embolism, bilateral ([place]) 01/02/2014    History reviewed. No pertinent surgical history.  Allergies Haldol [haloperidol]  Family History  Problem Relation Age of Onset   Hypertension Mother     Social History Social History   Tobacco Use   Smoking status: Every Day    Packs/day: 0.50    Types: Cigarettes   Smokeless tobacco: Never  Vaping Use   Vaping Use: Never used  Substance Use Topics   Alcohol use: Yes    Comment: BAC not available   Drug use: Yes    Types: Marijuana    Comment: Last used: 2 months ago UDS NA    Review of Systems  Constitutional: No  fever/chills Eyes: No visual changes. ENT: No sore throat. Cardiovascular: Denies chest pain. Respiratory: Denies shortness of breath. Gastrointestinal: No abdominal pain.  No nausea, no vomiting.  No diarrhea.  No constipation. Genitourinary: Negative for dysuria. Musculoskeletal: Negative for back pain. Skin: Left flank skin abrasion.  Neurological: Negative for headaches, focal weakness or numbness.  10-point ROS otherwise negative.  ____________________________________________   PHYSICAL EXAM:  VITAL SIGNS: ED Triage Vitals  Enc Vitals Group     BP 11/26/21 0447 126/79     Pulse Rate 11/26/21 0447 75     Resp 11/26/21 0447 20     Temp 11/26/21 0447 98.1 F (36.7 C)     Temp Source 11/26/21 0447 Oral     SpO2 11/26/21 0447 100 %   Constitutional: Alert and oriented. Well appearing and in no acute distress. Eyes: Conjunctivae are normal.  Head: Atraumatic. Nose: No congestion/rhinnorhea. Mouth/Throat: Mucous membranes are moist.   Neck: No stridor.  Cardiovascular: Normal rate, regular rhythm.  Respiratory: Normal respiratory effort.   Gastrointestinal: No distention.  Musculoskeletal:  No gross deformities of extremities. Neurologic:  Normal speech and language.  Skin:  Skin is warm and dry. 2 x 2 cm abrasion to the left flank. No surrounding cellulitis. No abscess.    ____________________________________________   PROCEDURES  Procedure(s) performed:   Procedures  None  ____________________________________________   INITIAL IMPRESSION / ASSESSMENT AND PLAN / ED COURSE  Pertinent labs & imaging results that were available during my care of the patient were reviewed by me and considered in my medical decision making (see chart for details).   Patient presents to the ED with abrasion to the left flank. Will continue bacitracin ointment which was provided here. Does not appear fungal. No abscess. Seems at baseline from a psych perspective. Not responding to  internal stimulus. Denies any SI/HI. Listed community resources and will discharge.    ____________________________________________  FINAL CLINICAL IMPRESSION(S) / ED DIAGNOSES  Final diagnoses:  Medication refill     MEDICATIONS GIVEN DURING THIS VISIT:  Medications  bacitracin ointment (has no administration in time range)   Note:  This document was prepared using Dragon voice recognition software and may include unintentional dictation errors.  Alona Bene, MD, [facility] Emergency Medicine    Keane Martelli, Arlyss Repress, MD 11/26/21 [phone]

## 2021-11-26 NOTE — ED Triage Notes (Signed)
Patient arrives to ED stating he needs a prescription for bacitracin. Patient presents with multiple packets of bacitracin.

## 2021-11-26 NOTE — ED Provider Notes (Signed)
[facility]-EMERGENCY DEPT Provider Note   CSN: [id number] Arrival date & time: 11/26/21  7829     History No chief complaint on file.   Cole Barrett is a 35 y.o. male to receive his Risperdol. No physical complaints at this time. Feeling well. No SI/HI. Not responding to internal stimuli  HPI     Past Medical History:  Diagnosis Date   COVID-19    Homelessness    Hypercholesterolemia    Mental disorder    Paranoid behavior (HCC)    PE (pulmonary embolism)    Schizophrenia (HCC)     Patient Active Problem List   Diagnosis Date Noted   Acute psychosis (HCC) 08/31/2019   Schizophrenia (HCC) 08/31/2019   Schizoaffective disorder, bipolar type (HCC) 01/19/2019   Alcohol abuse    Auditory hallucinations    Alcohol abuse with alcohol-induced mood disorder (HCC) 09/08/2016   Homelessness 09/03/2016   Person feigning illness 09/03/2016   Brief psychotic disorder (HCC) 08/29/2016   Cannabis use disorder, mild, abuse 08/29/2016   Pulmonary emboli (HCC) 07/13/2016   Adjustment disorder with emotional disturbance 03/12/2014   Pulmonary embolism and infarction (HCC) 01/02/2014   Acute left flank pain 01/02/2014   Pulmonary embolism, bilateral (HCC) 01/02/2014    No past surgical history on file.     Family History  Problem Relation Age of Onset   Hypertension Mother     Social History   Tobacco Use   Smoking status: Every Day    Packs/day: 0.50    Types: Cigarettes   Smokeless tobacco: Never  Vaping Use   Vaping Use: Never used  Substance Use Topics   Alcohol use: Yes    Comment: BAC not available   Drug use: Yes    Types: Marijuana    Comment: Last used: 2 months ago UDS NA    Home Medications Prior to Admission medications   Medication Sig Start Date End Date Taking? Authorizing Provider  bacitracin ointment Apply 1 application topically 2 (two) times daily. 11/24/21   Mannie Stabile, PA-C  ibuprofen (ADVIL) 600  MG tablet Take 1 tablet (600 mg total) by mouth every 6 (six) hours as needed. 03/15/21   Linwood Dibbles, MD  mupirocin cream (BACTROBAN) 2 % Apply 1 application topically 2 (two) times daily. 11/22/21   Melene Plan, DO  risperiDONE (RISPERDAL) 1 MG tablet Take 1 tablet (1 mg total) by mouth at bedtime. 10/15/21   Garlon Hatchet, PA-C  triamcinolone cream (KENALOG) 0.1 % Apply 1 application topically 2 (two) times daily. 05/16/21   Palumbo, April, MD  amantadine (SYMMETREL) 100 MG capsule Take 1 capsule (100 mg total) by mouth 2 (two) times daily. Patient not taking: Reported on 11/04/2019 09/01/19 11/17/19  [name], NP    Allergies    Haldol [haloperidol]  Review of Systems   Review of Systems  Constitutional:  Negative for fever.  HENT: Negative.    Eyes: Negative.   Respiratory:  Negative for shortness of breath.   Cardiovascular: Negative.   Gastrointestinal:  Negative for abdominal pain and vomiting.  Endocrine: Negative.   Genitourinary: Negative.   Musculoskeletal: Negative.   Skin:  Negative for rash.  Neurological:  Negative for headaches.  Psychiatric/Behavioral:  Negative for hallucinations and suicidal ideas.   All other systems reviewed and are negative.  Physical Exam Updated Vital Signs BP 122/74 (BP Location: Left Arm)    Pulse 88    Temp 98.8 F (37.1 C) (Oral)  Resp 16    SpO2 98%   Physical Exam Vitals and nursing note reviewed.  Constitutional:      General: He is not in acute distress.    Appearance: He is not ill-appearing.  HENT:     Head: Atraumatic.  Eyes:     Conjunctiva/sclera: Conjunctivae normal.  Cardiovascular:     Rate and Rhythm: Normal rate and regular rhythm.     Pulses: Normal pulses.     Heart sounds: No murmur heard. Pulmonary:     Effort: Pulmonary effort is normal. No respiratory distress.     Breath sounds: Normal breath sounds.  Abdominal:     General: Abdomen is flat. There is no distension.     Palpations: Abdomen is  soft.     Tenderness: There is no abdominal tenderness.  Musculoskeletal:        General: Normal range of motion.     Cervical back: Normal range of motion.  Skin:    General: Skin is warm and dry.     Capillary Refill: Capillary refill takes less than 2 seconds.  Neurological:     General: No focal deficit present.     Mental Status: He is alert.  Psychiatric:        Mood and Affect: Mood normal.    ED Results / Procedures / Treatments   Labs (all labs ordered are listed, but only abnormal results are displayed) Labs Reviewed - No data to display  EKG None  Radiology No results found.  Procedures Procedures   Medications Ordered in ED Medications  risperiDONE (RISPERDAL) tablet 1 mg (has no administration in time range)    ED Course  I have reviewed the triage vital signs and the nursing notes.  Pertinent labs & imaging results that were available during my care of the patient were reviewed by me and considered in my medical decision making (see chart for details).    MDM Rules/Calculators/A&P                         Risperdol administered without complication. Pt's questions asked and answered. Discharged home in good condition.   Final Clinical Impression(s) / ED Diagnoses Final diagnoses:  Medication monitoring encounter    Rx / DC Orders ED Discharge Orders     None        Rodena Piety 11/26/21 0911    Truddie Hidden, MD 11/26/21 [phone]

## 2021-11-26 NOTE — Discharge Instructions (Signed)
Please use your Risperdol as directed.

## 2021-11-26 NOTE — Discharge Instructions (Signed)
Please follow-up with your primary care doctor.

## 2021-11-26 NOTE — ED Triage Notes (Signed)
Patient states he needs his risperidone

## 2021-12-01 ENCOUNTER — Other Ambulatory Visit: Payer: Self-pay

## 2021-12-01 ENCOUNTER — Encounter (HOSPITAL_COMMUNITY): Payer: Self-pay | Admitting: Emergency Medicine

## 2021-12-01 ENCOUNTER — Emergency Department (HOSPITAL_COMMUNITY)
Admission: EM | Admit: 2021-12-01 | Discharge: 2021-12-01 | Disposition: A | Discharge status: Home / Self Care | Payer: Self-pay | Attending: Emergency Medicine | Admitting: Emergency Medicine

## 2021-12-01 DIAGNOSIS — R21 Rash and other nonspecific skin eruption: Secondary | ICD-10-CM | POA: Insufficient documentation

## 2021-12-01 MED ORDER — BACITRACIN ZINC 500 UNIT/GM EX OINT
TOPICAL_OINTMENT | Freq: Once | CUTANEOUS | Status: AC
Start: 2021-12-01 — End: 2021-12-01
  Filled 2021-12-01: qty 0.9

## 2021-12-01 NOTE — Discharge Instructions (Addendum)
Follow-up with your primary care doctor, keep wound clean and dry.  Come back to ER if you develop redness, fever or other new concerning symptom.

## 2021-12-01 NOTE — ED Provider Notes (Signed)
Emergency Medicine Provider Triage Evaluation Note  Cole Barrett , a 36 y.o. male  was evaluated in triage.  Pt complains of rash on his back.  He says it has been there for months. He also asks me for something for his head to "flatten it out."  He says it does not hurt.  He states he does not need his Risperdal.  He denies any vision changes.   Review of Systems  Positive:  Negative: See above  Physical Exam  BP 126/73 (BP Location: Right Arm)    Pulse 82    Temp 98.3 F (36.8 C) (Oral)    Resp 18    SpO2 98%  Gen:   Awake, no distress   Resp:  Normal effort  MSK:   Moves extremities without difficulty  Other:  Tangential speech, scab present on left flank.   Medical Decision Making  Medically screening exam initiated at 4:36 AM.  Appropriate orders placed.  Cole Barrett was informed that the remainder of the evaluation will be completed by another provider, this initial triage assessment does not replace that evaluation, and the importance of remaining in the ED until their evaluation is complete.     Cristina Gong, PA-C 12/01/21 0437    [name], MD 12/03/21 [phone]

## 2021-12-01 NOTE — ED Provider Notes (Signed)
[facility] EMERGENCY DEPARTMENT Provider Note   CSN: [id number] Arrival date & time: 12/01/21  0242     History  Chief Complaint  Patient presents with   Rash    Cole Barrett is a 36 y.o. male.  Presenting to the emergency room with concern for rash.  Patient states that this has been ongoing on for about a month.  No significant change today.  No fevers or chills.  Otherwise feels fine.  HPI     Home Medications Prior to Admission medications   Medication Sig Start Date End Date Taking? Authorizing Provider  bacitracin ointment Apply 1 application topically 2 (two) times daily. 11/24/21   Mannie Stabile, PA-C  ibuprofen (ADVIL) 600 MG tablet Take 1 tablet (600 mg total) by mouth every 6 (six) hours as needed. 03/15/21   Linwood Dibbles, MD  mupirocin cream (BACTROBAN) 2 % Apply 1 application topically 2 (two) times daily. 11/22/21   Melene Plan, DO  risperiDONE (RISPERDAL) 1 MG tablet Take 1 tablet (1 mg total) by mouth at bedtime. 10/15/21   Garlon Hatchet, PA-C  triamcinolone cream (KENALOG) 0.1 % Apply 1 application topically 2 (two) times daily. 05/16/21   Palumbo, April, MD  amantadine (SYMMETREL) 100 MG capsule Take 1 capsule (100 mg total) by mouth 2 (two) times daily. Patient not taking: Reported on 11/04/2019 09/01/19 11/17/19  [name], NP      Allergies    Haldol [haloperidol]    Review of Systems   Review of Systems  Skin:  Positive for rash.  All other systems reviewed and are negative.  Physical Exam Updated Vital Signs BP 128/86 (BP Location: Left Arm)    Pulse 87    Temp 98 F (36.7 C) (Oral)    Resp 20    SpO2 94%  Physical Exam Vitals and nursing note reviewed.  Constitutional:      General: He is not in acute distress.    Appearance: He is well-developed.  HENT:     Head: Normocephalic and atraumatic.  Eyes:     Conjunctiva/sclera: Conjunctivae normal.  Cardiovascular:     Rate and Rhythm: Normal rate and  regular rhythm.     Heart sounds: No murmur heard. Pulmonary:     Effort: Pulmonary effort is normal. No respiratory distress.  Musculoskeletal:        General: No swelling or deformity.     Cervical back: Neck supple.  Skin:    General: Skin is warm and dry.     Capillary Refill: Capillary refill takes less than 2 seconds.     Comments: Over the left lumbar region, there is approximately 2 cm area of healing abrasion, no surrounding erythema or underlying induration  Neurological:     Mental Status: He is alert.  Psychiatric:        Mood and Affect: Mood normal.    ED Results / Procedures / Treatments   Labs (all labs ordered are listed, but only abnormal results are displayed) Labs Reviewed - No data to display  EKG None  Radiology No results found.  Procedures Procedures    Medications Ordered in ED Medications  bacitracin ointment ( Topical Given 12/01/21 1325)    ED Course/ Medical Decision Making/ A&P                           Medical Decision Making  36 year old gentleman presents to ER with concern for  rash.  On exam noted small abrasion that appears to be healing well over his left lumbar region.  No evidence for active infection or cellulitis or abscess at present.  Recommend supportive care, discussed wound care measures and discharged home.        Final Clinical Impression(s) / ED Diagnoses Final diagnoses:  Rash    Rx / DC Orders ED Discharge Orders     None         Milagros Loll, MD 12/01/21 [phone]

## 2021-12-01 NOTE — ED Triage Notes (Signed)
Patient reports skin rashes at left lower back this week .

## 2021-12-03 ENCOUNTER — Other Ambulatory Visit: Payer: Self-pay

## 2021-12-03 ENCOUNTER — Emergency Department (HOSPITAL_COMMUNITY)
Admission: EM | Admit: 2021-12-03 | Discharge: 2021-12-03 | Disposition: A | Discharge status: Home / Self Care | Payer: Self-pay | Attending: Student | Admitting: Student

## 2021-12-03 DIAGNOSIS — Z760 Encounter for issue of repeat prescription: Secondary | ICD-10-CM | POA: Insufficient documentation

## 2021-12-03 DIAGNOSIS — F209 Schizophrenia, unspecified: Secondary | ICD-10-CM | POA: Insufficient documentation

## 2021-12-03 MED ORDER — RISPERIDONE 1 MG PO TABS
1.0000 mg | ORAL_TABLET | Freq: Once | ORAL | Status: AC
  Administered 2021-12-03: 1 mg via ORAL
  Filled 2021-12-03: qty 1

## 2021-12-03 NOTE — ED Provider Notes (Signed)
[facility] EMERGENCY DEPARTMENT Provider Note   CSN: [place]:[id number] Arrival date & time: 12/03/21  0118     History  Chief Complaint  Patient presents with   Medication Reaction    Cole Barrett is a 36 y.o. male with past medical history significant for homelessness, schizophrenia, history of PE.  Presents to the emergency department questioning a dose of his Risperdal medication.  States that he did not have access to his medications today but does have this medication.  Patient reports that he hears voices at baseline.  Denies that the voices command him.  Denies any change in his baseline auditory hallucinations.  Denies any visual elucidation, suicidal ideation, homicidal ideation.  Denies any illicit drug use or alcohol use.  HPI     Home Medications Prior to Admission medications   Medication Sig Start Date End Date Taking? Authorizing Provider  bacitracin ointment Apply 1 application topically 2 (two) times daily. 11/24/21   Suzy Bouchard, PA-C  ibuprofen (ADVIL) 600 MG tablet Take 1 tablet (600 mg total) by mouth every 6 (six) hours as needed. 03/15/21   Dorie Rank, MD  mupirocin cream (BACTROBAN) 2 % Apply 1 application topically 2 (two) times daily. 11/22/21   Deno Etienne, DO  risperiDONE (RISPERDAL) 1 MG tablet Take 1 tablet (1 mg total) by mouth at bedtime. 10/15/21   Larene Pickett, PA-C  triamcinolone cream (KENALOG) 0.1 % Apply 1 application topically 2 (two) times daily. 05/16/21   Palumbo, April, MD  amantadine (SYMMETREL) 100 MG capsule Take 1 capsule (100 mg total) by mouth 2 (two) times daily. Patient not taking: Reported on 11/04/2019 09/01/19 11/17/19  Mallie Darting, NP      Allergies    Haldol [haloperidol]    Review of Systems   Review of Systems  Constitutional:  Negative for chills and fever.  Eyes:  Negative for visual disturbance.  Respiratory:  Negative for shortness of breath.   Cardiovascular:  Negative for chest  pain.  Gastrointestinal:  Negative for abdominal pain, nausea and vomiting.  Genitourinary:  Negative for difficulty urinating and dysuria.  Musculoskeletal:  Negative for back pain and neck pain.  Skin:  Negative for color change.  Neurological:  Negative for dizziness, syncope, light-headedness and headaches.  Psychiatric/Behavioral:  Positive for hallucinations. Negative for agitation, confusion and suicidal ideas.    Physical Exam Updated Vital Signs BP 127/85 (BP Location: Right Arm)    Pulse 98    Temp 98 F (36.7 C) (Oral)    Resp 17    SpO2 97%  Physical Exam Vitals and nursing note reviewed.  Constitutional:      General: He is not in acute distress.    Appearance: He is not ill-appearing, toxic-appearing or diaphoretic.  HENT:     Head: Normocephalic.  Eyes:     General: No scleral icterus.       Right eye: No discharge.        Left eye: No discharge.  Cardiovascular:     Rate and Rhythm: Normal rate.  Pulmonary:     Effort: Pulmonary effort is normal.  Skin:    General: Skin is warm and dry.  Neurological:     General: No focal deficit present.     Mental Status: He is alert.  Psychiatric:        Attention and Perception: He is attentive. He perceives auditory hallucinations. He does not perceive visual hallucinations.        Behavior:  Behavior is cooperative.        Thought Content: Thought content does not include suicidal ideation.    ED Results / Procedures / Treatments   Labs (all labs ordered are listed, but only abnormal results are displayed) Labs Reviewed - No data to display  EKG None  Radiology No results found.  Procedures Procedures    Medications Ordered in ED Medications  risperiDONE (RISPERDAL) tablet 1 mg (has no administration in time range)    ED Course/ Medical Decision Making/ A&P                           Medical Decision Making  Alert 36 year old male no acute distress, nontoxic-appearing.  Presents to the emergency  department with a chief complaint of requesting Risperdal medication.  Patient has history of schizophrenia.  Per chart review patient seen multiple times in the emergency department requesting risperidone medication.  This appears to be a chronic issue for him.  Denies any change in his auditory hallucinations.  Denies any SI, HI, visual hallucination.  Denies any illicit drug use or EtOH use.  Patient does not appear to be having an acute psychotic episode.  We will give patient his Risperdal and plan to discharge.  Patient given resources to follow-up in outpatient setting.  Patient given information to follow-up with [facility].        Final Clinical Impression(s) / ED Diagnoses Final diagnoses:  None    Rx / DC Orders ED Discharge Orders     None         Dyann Ruddle 12/03/21 Verlin Fester, MD 12/03/21 [phone]

## 2021-12-03 NOTE — Discharge Instructions (Signed)
You came to the emergency department to receive a dose of your Risperdal medication.  You received this medication.  Please follow-up with psychiatric provider listed on resource guide for further management of your schizophrenia.  Get help right away if: You feel out of control. You or others notice warning signs of suicide, such as: Using drugs or alcohol more often. Expressing feelings of not having a purpose in life or feeling trapped, guilty, anxious, agitated, or hopeless. Withdrawing from friends and family. Showing uncontrolled anger, recklessness, and dramatic mood changes. Talking about suicide or searching for methods.

## 2021-12-03 NOTE — ED Triage Notes (Signed)
Patient requesting his risperadol

## 2021-12-07 ENCOUNTER — Encounter (HOSPITAL_COMMUNITY): Payer: Self-pay

## 2021-12-07 ENCOUNTER — Emergency Department (HOSPITAL_COMMUNITY)
Admission: EM | Admit: 2021-12-07 | Discharge: 2021-12-07 | Disposition: A | Discharge status: Home / Self Care | Payer: Self-pay | Attending: Emergency Medicine | Admitting: Emergency Medicine

## 2021-12-07 ENCOUNTER — Other Ambulatory Visit: Payer: Self-pay

## 2021-12-07 DIAGNOSIS — R21 Rash and other nonspecific skin eruption: Secondary | ICD-10-CM | POA: Insufficient documentation

## 2021-12-07 DIAGNOSIS — Z760 Encounter for issue of repeat prescription: Secondary | ICD-10-CM | POA: Insufficient documentation

## 2021-12-07 MED ORDER — BACITRACIN ZINC 500 UNIT/GM EX OINT
TOPICAL_OINTMENT | CUTANEOUS | Status: AC
  Filled 2021-12-07: qty 5.4

## 2021-12-07 MED ORDER — RISPERIDONE 1 MG PO TABS
1.0000 mg | ORAL_TABLET | Freq: Once | ORAL | Status: AC
  Administered 2021-12-07: 1 mg via ORAL
  Filled 2021-12-07: qty 1

## 2021-12-07 MED ORDER — RISPERIDONE 1 MG PO TABS: 1.0000 mg | ORAL_TABLET | Freq: Every day | ORAL | 0 refills | Status: DC

## 2021-12-07 NOTE — Discharge Instructions (Addendum)
You were given a prescription for your respite home, you may take 1 tablet at bedtime daily.  I also provided the [facility] in order to have outpatient follow-up for this ongoing complaint.

## 2021-12-07 NOTE — ED Provider Notes (Signed)
[name] Provider Note   CSN: [id number] Arrival date & time: 12/07/21  0357     History Chief Complaint  Patient presents with   Medication Refill    Cole Barrett is a 36 y.o. male.  36 year old male with a past medical history of Mental disorder, schizophrenia presents to the ED for medication refill.  Patient reports he usually gets his risperidone filled while in the ED.  He does not have any PCP, or psychiatry outpatient resources.  Of note, patient is homeless.  He does not have any suicidal ideations, homicidal ideations on today's visit.  In addition, he describes a rash to the CR left back, reports he has been applying bacitracin to the area with improvement in symptoms.  Is also requesting a refill of this at this time.  No chest pain, no shortness of breath, no visual or auditory hallucinations.  The history is provided by the patient and medical records.  Medication Refill     Home Medications Prior to Admission medications   Medication Sig Start Date End Date Taking? Authorizing Provider  bacitracin ointment Apply 1 application topically 2 (two) times daily. 11/24/21   Mannie Stabile, PA-C  ibuprofen (ADVIL) 600 MG tablet Take 1 tablet (600 mg total) by mouth every 6 (six) hours as needed. 03/15/21   Linwood Dibbles, MD  mupirocin cream (BACTROBAN) 2 % Apply 1 application topically 2 (two) times daily. 11/22/21   Melene Plan, DO  risperiDONE (RISPERDAL) 1 MG tablet Take 1 tablet (1 mg total) by mouth at bedtime. 12/07/21 01/06/22  Claude Manges, PA-C  triamcinolone cream (KENALOG) 0.1 % Apply 1 application topically 2 (two) times daily. 05/16/21   Palumbo, April, MD  amantadine (SYMMETREL) 100 MG capsule Take 1 capsule (100 mg total) by mouth 2 (two) times daily. Patient not taking: Reported on 11/04/2019 09/01/19 11/17/19  [name], NP      Allergies    Haldol [haloperidol]    Review of Systems   Review of Systems   Constitutional:  Negative for chills and fever.  HENT:  Negative for sore throat.   Respiratory:  Negative for shortness of breath.   Cardiovascular:  Negative for chest pain.  Gastrointestinal:  Negative for abdominal pain.  Skin:  Positive for rash.  Psychiatric/Behavioral:  Negative for hallucinations and suicidal ideas. The patient is not nervous/anxious.    Physical Exam Updated Vital Signs BP (!) 171/112    Pulse 81    Temp 98.4 F (36.9 C) (Oral)    Resp 14    SpO2 100%  Physical Exam Vitals and nursing note reviewed.  Constitutional:      Appearance: Normal appearance.     Comments: Disheveled.   HENT:     Head: Normocephalic and atraumatic.     Mouth/Throat:     Mouth: Mucous membranes are moist.  Eyes:     Pupils: Pupils are equal, round, and reactive to light.  Cardiovascular:     Rate and Rhythm: Normal rate.  Pulmonary:     Effort: Pulmonary effort is normal.  Abdominal:     General: Abdomen is flat.  Musculoskeletal:     Cervical back: Normal range of motion and neck supple.  Skin:    General: Skin is warm and dry.     Findings: Rash present.       Neurological:     Mental Status: He is alert and oriented to person, place, and time.    ED  Results / Procedures / Treatments   Labs (all labs ordered are listed, but only abnormal results are displayed) Labs Reviewed - No data to display  EKG None  Radiology No results found.  Procedures Procedures    Medications Ordered in ED Medications  risperiDONE (RISPERDAL) tablet 1 mg (has no administration in time range)    ED Course/ Medical Decision Making/ A&P                           Medical Decision Making  Patient presents to the ED requesting medication refill for his history of psychiatry along with paranoid behavior.  He is without any SI or HI.  Of note patient is homeless, does not have any primary care.  Previously evaluated in the ER and given a refill for his risperidone, it appears  that patient does not get this medication refilled, he does come to the ED in order to be provided with an actual pill.  He is also requesting some bacitracin in order to have this placed to his rash he has been seen here multiple times.  He is hemodynamically stable aside from his slight elevated blood pressure, which he repeatedly has during his visit.  He denies any other discomfort, no systemic sigs from the rash that would warrant admission at this time.  He is denying SI or HI.  I feel that he can trial outpatient treatment.   Portions of this note were generated with Scientist, clinical (histocompatibility and immunogenetics). Dictation errors may occur despite best attempts at proofreading.  Final Clinical Impression(s) / ED Diagnoses Final diagnoses:  Medication refill    Rx / DC Orders ED Discharge Orders          Ordered    risperiDONE (RISPERDAL) 1 MG tablet  Daily at bedtime        12/07/21 0818              Claude Manges, PA-C 12/07/21 0820    Jacalyn Lefevre, MD 12/07/21 1057

## 2021-12-07 NOTE — ED Triage Notes (Signed)
Pt states that he is hearing voices. Pt states that he needs his Risperidone. Pt states that he scratched his back on a wall and needs some cream.

## 2021-12-09 ENCOUNTER — Emergency Department (HOSPITAL_COMMUNITY)
Admission: EM | Admit: 2021-12-09 | Discharge: 2021-12-10 | Disposition: A | Discharge status: Home / Self Care | Payer: Self-pay | Attending: Emergency Medicine | Admitting: Emergency Medicine

## 2021-12-09 ENCOUNTER — Other Ambulatory Visit: Payer: Self-pay

## 2021-12-09 DIAGNOSIS — R21 Rash and other nonspecific skin eruption: Secondary | ICD-10-CM | POA: Insufficient documentation

## 2021-12-09 NOTE — ED Triage Notes (Signed)
Pt reported to ED with c/o rash of on abdomen and back since awakening from nap today. Pt has odd behaviors. Requesting antibiotic ointment.

## 2021-12-10 NOTE — ED Provider Notes (Signed)
[facility] EMERGENCY DEPARTMENT Provider Note   CSN: [id number] Arrival date & time: 12/09/21  1909     History  Chief Complaint  Patient presents with   Rash    Cole Barrett is a 36 y.o. male presenting for evaluation of rash.  Patient states he needs an antibiotic ointment for his rash.  His rash has been present for several months, is not changing.  He has ointment at home, but cannot get to it tonight.  No increased pain.  No fevers.  HPI     Home Medications Prior to Admission medications   Medication Sig Start Date End Date Taking? Authorizing Provider  bacitracin ointment Apply 1 application topically 2 (two) times daily. 11/24/21   Mannie Stabile, PA-C  ibuprofen (ADVIL) 600 MG tablet Take 1 tablet (600 mg total) by mouth every 6 (six) hours as needed. 03/15/21   Linwood Dibbles, MD  mupirocin cream (BACTROBAN) 2 % Apply 1 application topically 2 (two) times daily. 11/22/21   Melene Plan, DO  risperiDONE (RISPERDAL) 1 MG tablet Take 1 tablet (1 mg total) by mouth at bedtime. 12/07/21 01/06/22  Claude Manges, PA-C  triamcinolone cream (KENALOG) 0.1 % Apply 1 application topically 2 (two) times daily. 05/16/21   Palumbo, April, MD  amantadine (SYMMETREL) 100 MG capsule Take 1 capsule (100 mg total) by mouth 2 (two) times daily. Patient not taking: Reported on 11/04/2019 09/01/19 11/17/19  [name], NP      Allergies    Haldol [haloperidol]    Review of Systems   Review of Systems  Skin:  Positive for rash.   Physical Exam Updated Vital Signs BP 133/65    Pulse 79    Temp 98 F (36.7 C) (Oral)    Resp 17    Ht 5\' 10"  (1.778 m)    Wt 99.8 kg    SpO2 98%    BMI 31.57 kg/m  Physical Exam Vitals and nursing note reviewed.  Constitutional:      General: He is not in acute distress.    Appearance: He is well-developed.  HENT:     Head: Normocephalic and atraumatic.  Eyes:     Extraocular Movements: Extraocular movements intact.   Cardiovascular:     Rate and Rhythm: Normal rate.  Pulmonary:     Effort: Pulmonary effort is normal.  Abdominal:     General: There is no distension.  Musculoskeletal:        General: Normal range of motion.     Cervical back: Normal range of motion.  Skin:    General: Skin is warm.     Findings: Rash present.     Comments: Scattered lesions that appear picked at and in various stages of healing. No lesion with drainage or signs of infection  Neurological:     Mental Status: He is alert and oriented to person, place, and time.    ED Results / Procedures / Treatments   Labs (all labs ordered are listed, but only abnormal results are displayed) Labs Reviewed - No data to display  EKG None  Radiology No results found.  Procedures Procedures    Medications Ordered in ED Medications - No data to display  ED Course/ Medical Decision Making/ A&P                           Medical Decision Making   This patient presents to the ED for concern  of rash. This involves a number of treatment options, and is a complaint that carries with it a low risk of complications and morbidity.    Medicines ordered:  I ordered medication including bacitracin for rash/to prevent infection   Dispostion:  After consideration of the diagnostic results and the patients response to treatment, I feel that the patent would benefit from OP management.  Lesions do not appear acutely infected at this time.  We will give bacitracin to help prevent infection as provided.  Lesions are not concerning for SJS, TENS, syphilis. At this time, pt appears safe for d/c. Return precautions given. Pt states he understands and agrees to plan.   Final Clinical Impression(s) / ED Diagnoses Final diagnoses:  Rash and nonspecific skin eruption    Rx / DC Orders ED Discharge Orders     None         Alveria Apley, PA-C 12/10/21 0220    Zadie Rhine, MD 12/11/21 [phone]

## 2021-12-10 NOTE — Discharge Instructions (Signed)
Continue taking home medications as prescribed Use the cream as needed. Do not pick at the rash, as this will cause increased risk of infection

## 2021-12-12 ENCOUNTER — Other Ambulatory Visit: Payer: Self-pay

## 2021-12-12 ENCOUNTER — Encounter (HOSPITAL_COMMUNITY): Payer: Self-pay | Admitting: Emergency Medicine

## 2021-12-12 ENCOUNTER — Emergency Department (HOSPITAL_COMMUNITY)
Admission: EM | Admit: 2021-12-12 | Discharge: 2021-12-12 | Discharge status: Against Medical Advice | Payer: Self-pay | Source: Home / Self Care

## 2021-12-12 ENCOUNTER — Emergency Department (HOSPITAL_COMMUNITY)
Admission: EM | Admit: 2021-12-12 | Discharge: 2021-12-12 | Disposition: A | Discharge status: Home / Self Care | Payer: Self-pay | Attending: Emergency Medicine | Admitting: Emergency Medicine

## 2021-12-12 DIAGNOSIS — R200 Anesthesia of skin: Secondary | ICD-10-CM | POA: Insufficient documentation

## 2021-12-12 DIAGNOSIS — R21 Rash and other nonspecific skin eruption: Secondary | ICD-10-CM

## 2021-12-12 MED ORDER — HYDROCORTISONE 1 % EX CREA
TOPICAL_CREAM | Freq: Once | CUTANEOUS | Status: DC
  Filled 2021-12-12 (×2): qty 28

## 2021-12-12 NOTE — Discharge Instructions (Signed)
Please use skin cream as prescribed.  You can also purchase this over-the-counter at any pharmacy.

## 2021-12-12 NOTE — ED Triage Notes (Signed)
C/o rash to abd and back x 2-3 days.  Requesting "6 packs of cream" and a Rx.

## 2021-12-12 NOTE — ED Provider Notes (Signed)
[facility] EMERGENCY DEPARTMENT Provider Note   CSN: [id number] Arrival date & time: 12/12/21  1234     History Chief Complaint  Patient presents with   Rash    Cole Barrett is a 36 y.o. male who presents the emergency department for skin lesions and requesting ointment.   Rash     Home Medications Prior to Admission medications   Medication Sig Start Date End Date Taking? Authorizing Provider  bacitracin ointment Apply 1 application topically 2 (two) times daily. 11/24/21   Suzy Bouchard, PA-C  ibuprofen (ADVIL) 600 MG tablet Take 1 tablet (600 mg total) by mouth every 6 (six) hours as needed. 03/15/21   Dorie Rank, MD  mupirocin cream (BACTROBAN) 2 % Apply 1 application topically 2 (two) times daily. 11/22/21   Deno Etienne, DO  risperiDONE (RISPERDAL) 1 MG tablet Take 1 tablet (1 mg total) by mouth at bedtime. 12/07/21 01/06/22  Janeece Fitting, PA-C  triamcinolone cream (KENALOG) 0.1 % Apply 1 application topically 2 (two) times daily. 05/16/21   Palumbo, April, MD  amantadine (SYMMETREL) 100 MG capsule Take 1 capsule (100 mg total) by mouth 2 (two) times daily. Patient not taking: Reported on 11/04/2019 09/01/19 11/17/19  Mallie Darting, NP      Allergies    Haldol [haloperidol]    Review of Systems   Review of Systems  Skin:  Positive for rash.  All other systems reviewed and are negative.  Physical Exam Updated Vital Signs BP 124/88 (BP Location: Barrett Arm)    Pulse 89    Temp 98.7 F (37.1 C) (Oral)    Resp 18    SpO2 99%  Physical Exam Vitals and nursing note reviewed.  Constitutional:      Appearance: Normal appearance.  HENT:     Head: Normocephalic and atraumatic.  Eyes:     General:        Barrett eye: No discharge.        Left eye: No discharge.     Conjunctiva/sclera: Conjunctivae normal.  Pulmonary:     Effort: Pulmonary effort is normal.  Skin:    General: Skin is warm and dry.     Findings: No rash.     Comments:  Skin lesions of various stages of healing.  No clear ulcerations or purulent drainage.  Neurological:     General: No focal deficit present.     Mental Status: He is alert.  Psychiatric:        Mood and Affect: Mood normal.        Behavior: Behavior normal.    ED Results / Procedures / Treatments   Labs (all labs ordered are listed, but only abnormal results are displayed) Labs Reviewed - No data to display  EKG None  Radiology No results found.  Procedures Procedures   Medications Ordered in ED Medications  hydrocortisone cream 1 % (has no administration in time range)    ED Course/ Medical Decision Making/ A&P                           Medical Decision Making  Cole Barrett is a 36 y.o. male who presents to the emergency department today for for evaluation of the skin lesions.  They are not concerning for Cole Barrett, TEN, or syphilis.  We will provide him hydrocortisone cream in the department and discharge.  Final Clinical Impression(s) / ED Diagnoses Final diagnoses:  None  Rx / DC Orders ED Discharge Orders     None         Hendricks Limes, [place] 12/12/21 [place], [place], DO 12/12/21 Grier Mitts

## 2021-12-13 ENCOUNTER — Encounter (HOSPITAL_COMMUNITY): Payer: Self-pay

## 2021-12-13 ENCOUNTER — Emergency Department (HOSPITAL_COMMUNITY)
Admission: EM | Admit: 2021-12-13 | Discharge: 2021-12-13 | Disposition: A | Discharge status: Home / Self Care | Payer: Self-pay | Attending: Student | Admitting: Student

## 2021-12-13 DIAGNOSIS — Z760 Encounter for issue of repeat prescription: Secondary | ICD-10-CM | POA: Insufficient documentation

## 2021-12-13 DIAGNOSIS — Z8616 Personal history of COVID-19: Secondary | ICD-10-CM | POA: Insufficient documentation

## 2021-12-13 DIAGNOSIS — R21 Rash and other nonspecific skin eruption: Secondary | ICD-10-CM | POA: Insufficient documentation

## 2021-12-13 MED ORDER — HYDROCORTISONE 1 % EX CREA
TOPICAL_CREAM | Freq: Three times a day (TID) | CUTANEOUS | Status: DC | PRN
  Filled 2021-12-13: qty 28

## 2021-12-13 NOTE — ED Provider Notes (Signed)
[facility]-EMERGENCY DEPT Provider Note   CSN: [id number] Arrival date & time: 12/13/21  0424     History  Chief Complaint  Patient presents with   Rash    Cole Barrett is a 36 y.o. male who is well-known to this department who presents to the emergency department requesting a medication refill.  Patient said a persistent rash on his abdomen and back, and he is requesting "the cream he is always given".  Patient was most recently seen yesterday for the same complaint, was given hydrocortisone cream and discharged in stable condition.   Rash Associated symptoms: no fever and no shortness of breath      Past Medical History:  Diagnosis Date   COVID-19    Homelessness    Hypercholesterolemia    Mental disorder    Paranoid behavior (HCC)    PE (pulmonary embolism)    Schizophrenia (HCC)      Home Medications Prior to Admission medications   Medication Sig Start Date End Date Taking? Authorizing Provider  bacitracin ointment Apply 1 application topically 2 (two) times daily. 11/24/21   Mannie Stabile, PA-C  ibuprofen (ADVIL) 600 MG tablet Take 1 tablet (600 mg total) by mouth every 6 (six) hours as needed. 03/15/21   Linwood Dibbles, MD  mupirocin cream (BACTROBAN) 2 % Apply 1 application topically 2 (two) times daily. 11/22/21   Melene Plan, DO  risperiDONE (RISPERDAL) 1 MG tablet Take 1 tablet (1 mg total) by mouth at bedtime. 12/07/21 01/06/22  Claude Manges, PA-C  triamcinolone cream (KENALOG) 0.1 % Apply 1 application topically 2 (two) times daily. 05/16/21   Palumbo, April, MD  amantadine (SYMMETREL) 100 MG capsule Take 1 capsule (100 mg total) by mouth 2 (two) times daily. Patient not taking: Reported on 11/04/2019 09/01/19 11/17/19  [name], NP      Allergies    Haldol [haloperidol]    Review of Systems   Review of Systems  Constitutional:  Negative for chills and fever.  Respiratory:  Negative for shortness of breath.    Cardiovascular:  Negative for chest pain.  Skin:  Positive for rash. Negative for wound.  All other systems reviewed and are negative.  Physical Exam Updated Vital Signs BP 137/75 (BP Location: Right Arm)    Pulse 85    Temp 97.8 F (36.6 C) (Oral)    Resp 14    SpO2 100%  Physical Exam Vitals and nursing note reviewed.  Constitutional:      Appearance: Normal appearance.  HENT:     Head: Normocephalic and atraumatic.  Eyes:     Conjunctiva/sclera: Conjunctivae normal.  Pulmonary:     Effort: Pulmonary effort is normal. No respiratory distress.  Skin:    General: Skin is warm and dry.     Comments: Several partially healed lesions on the left lower back, without overlying erythema, edema, or purulent drainage.  No overlying signs of cellulitis.  Neurological:     Mental Status: He is alert.  Psychiatric:        Mood and Affect: Mood normal.        Behavior: Behavior normal.    ED Results / Procedures / Treatments   Labs (all labs ordered are listed, but only abnormal results are displayed) Labs Reviewed - No data to display  EKG None  Radiology No results found.  Procedures Procedures    Medications Ordered in ED Medications  hydrocortisone cream 1 % (has no administration in time range)  ED Course/ Medical Decision Making/ A&P                           Medical Decision Making Patient is a 36 year old male presents the emergency department requesting medication refill for cream for his persistent rash.  No fevers or chills.  On my exam patient has several partially healed lesions on his left lower back, without overlying signs of cellulitis.  He states his rash has not gotten any worse, and if anything he believes is getting better.  The rash is nonvesicular, not in a dermatomal pattern.  Patient given hydrocortisone cream in the emergency department, and given the left overtube to take home with him.  He is not requiring admission or inpatient treatment  for his symptoms.  I have low concern for worsening rash or skin infection requiring antibiotics.  He is discharged in stable condition.  Final Clinical Impression(s) / ED Diagnoses Final diagnoses:  Rash  Medication refill    Rx / DC Orders ED Discharge Orders     None      Portions of this report may have been transcribed using voice recognition software. Every effort was made to ensure accuracy; however, inadvertent computerized transcription errors may be present.    Jeanella Flattery 12/13/21 9628    Glendora Score, MD 12/13/21 [phone]

## 2021-12-13 NOTE — ED Triage Notes (Signed)
Pt states that he needs some cream for the rash on his abdomen and back.

## 2021-12-13 NOTE — Discharge Instructions (Addendum)
You were seen in the emergency department today for rash.  We have given you hydrocortisone cream to apply to the affected area.

## 2021-12-14 ENCOUNTER — Emergency Department (HOSPITAL_COMMUNITY)
Admission: EM | Admit: 2021-12-14 | Discharge: 2021-12-14 | Disposition: A | Discharge status: Home / Self Care | Payer: Self-pay | Attending: Emergency Medicine | Admitting: Emergency Medicine

## 2021-12-14 DIAGNOSIS — Z760 Encounter for issue of repeat prescription: Secondary | ICD-10-CM | POA: Insufficient documentation

## 2021-12-14 DIAGNOSIS — F209 Schizophrenia, unspecified: Secondary | ICD-10-CM | POA: Insufficient documentation

## 2021-12-14 DIAGNOSIS — Z5900 Homelessness unspecified: Secondary | ICD-10-CM | POA: Insufficient documentation

## 2021-12-14 MED ORDER — RISPERIDONE 1 MG PO TABS: 1.0000 mg | ORAL_TABLET | Freq: Every day | ORAL | 0 refills | Status: DC

## 2021-12-14 MED ORDER — RISPERIDONE 1 MG PO TABS
1.0000 mg | ORAL_TABLET | Freq: Once | ORAL | Status: AC
Start: 2021-12-14 — End: 2021-12-14
  Administered 2021-12-14: 1 mg via ORAL
  Filled 2021-12-14: qty 1

## 2021-12-14 NOTE — ED Triage Notes (Signed)
Patient requesting risperedol

## 2021-12-14 NOTE — Discharge Instructions (Addendum)
Follow up with primary care as soon as possible. Follow up at behavioral health urgent care as well.  Return to the ER for new or worsening sxs or any other concerns.

## 2021-12-14 NOTE — ED Provider Notes (Signed)
[name] EMERGENCY DEPARTMENT Provider Note   CSN: [id number] Arrival date & time: 12/14/21  0146     History  Chief Complaint  Patient presents with   Medication Refill    Cole Barrett is a 36 y.o. male with a history of schizophrenia, homelessness, and substance use who presents to the emergency department requesting his Risperdal dose tonight.  He otherwise feels well.  No alleviating or aggravating factors.  He denies SI, HI, or hallucinations at this time.  HPI     Home Medications Prior to Admission medications   Medication Sig Start Date End Date Taking? Authorizing Provider  bacitracin ointment Apply 1 application topically 2 (two) times daily. 11/24/21   Mannie Stabile, PA-C  ibuprofen (ADVIL) 600 MG tablet Take 1 tablet (600 mg total) by mouth every 6 (six) hours as needed. 03/15/21   Linwood Dibbles, MD  mupirocin cream (BACTROBAN) 2 % Apply 1 application topically 2 (two) times daily. 11/22/21   Melene Plan, DO  risperiDONE (RISPERDAL) 1 MG tablet Take 1 tablet (1 mg total) by mouth at bedtime. 12/07/21 01/06/22  Claude Manges, PA-C  triamcinolone cream (KENALOG) 0.1 % Apply 1 application topically 2 (two) times daily. 05/16/21   Palumbo, April, MD  amantadine (SYMMETREL) 100 MG capsule Take 1 capsule (100 mg total) by mouth 2 (two) times daily. Patient not taking: Reported on 11/04/2019 09/01/19 11/17/19  [name], NP      Allergies    Haldol [haloperidol]    Review of Systems   Review of Systems  Constitutional:  Negative for chills and fever.  Respiratory:  Negative for shortness of breath.   Cardiovascular:  Negative for chest pain.  Neurological:  Negative for syncope.  Psychiatric/Behavioral:  Negative for hallucinations and suicidal ideas.   All other systems reviewed and are negative.  Physical Exam Updated Vital Signs BP 135/71    Pulse 90    Temp 97.7 F (36.5 C) (Oral)    Resp 16    SpO2 98%  Physical Exam Vitals  and nursing note reviewed.  Constitutional:      General: He is not in acute distress.    Appearance: He is well-developed. He is not toxic-appearing.     Comments: Initially sleeping, easily arousable and subsequently awake & alert.   HENT:     Head: Normocephalic and atraumatic.  Eyes:     General:        Right eye: No discharge.        Left eye: No discharge.     Conjunctiva/sclera: Conjunctivae normal.  Cardiovascular:     Rate and Rhythm: Normal rate and regular rhythm.  Pulmonary:     Effort: Pulmonary effort is normal. No respiratory distress.     Breath sounds: Normal breath sounds. No wheezing, rhonchi or rales.  Abdominal:     General: There is no distension.     Palpations: Abdomen is soft.     Tenderness: There is no abdominal tenderness.  Musculoskeletal:     Cervical back: Neck supple.  Skin:    General: Skin is warm and dry.     Findings: No rash.  Neurological:     Comments: Clear speech.   Psychiatric:        Behavior: Behavior is cooperative.    ED Results / Procedures / Treatments   Labs (all labs ordered are listed, but only abnormal results are displayed) Labs Reviewed - No data to display  EKG None  Radiology No results found.  Procedures Procedures    Medications Ordered in ED Medications  risperiDONE (RISPERDAL) tablet 1 mg (has no administration in time range)    ED Course/ Medical Decision Making/ A&P                           Medical Decision Making  Patient presents to the emergency department requesting a dose of his Risperdal tonight.  He is nontoxic, resting comfortably, his vitals are within normal limits.  Chart reviewed for additional history including prior ED visits, patient is well-known to the emergency department.  He has no additional complaints at this time, he was given a dose of his Risperdal, and discharged with resources.I discussed treatment plan, need for follow-up, and return precautions with the patient.  Provided opportunity for questions, patient confirmed understanding and is in agreement with plan.         Final Clinical Impression(s) / ED Diagnoses Final diagnoses:  Medication refill    Rx / DC Orders ED Discharge Orders          Ordered    risperiDONE (RISPERDAL) 1 MG tablet  Daily at bedtime        12/14/21 0253              Keely Drennan, Pleas Koch, PA-C 12/14/21 0254    Tilden Fossa, MD 12/14/21 [phone]

## 2021-12-15 ENCOUNTER — Other Ambulatory Visit: Payer: Self-pay

## 2021-12-15 ENCOUNTER — Encounter (HOSPITAL_COMMUNITY): Payer: Self-pay

## 2021-12-15 ENCOUNTER — Emergency Department (HOSPITAL_COMMUNITY)
Admission: EM | Admit: 2021-12-15 | Discharge: 2021-12-15 | Disposition: A | Discharge status: Home / Self Care | Payer: Self-pay | Attending: Emergency Medicine | Admitting: Emergency Medicine

## 2021-12-15 DIAGNOSIS — Z760 Encounter for issue of repeat prescription: Secondary | ICD-10-CM | POA: Insufficient documentation

## 2021-12-15 NOTE — ED Notes (Signed)
Pt called x2 in ED lobby. Pt could not be located within ED lobby, and did not respond to name call.   °

## 2021-12-15 NOTE — Discharge Instructions (Signed)
Go to your [facility] at  [facility]  To pick up your prescription and take your medicine.

## 2021-12-15 NOTE — ED Triage Notes (Signed)
Pt needs a medication refill on his Risperidone.

## 2021-12-15 NOTE — ED Notes (Signed)
I provided reinforced discharge education based off of discharge instructions. Pt acknowledged and understood my education. Pt had no further questions/concerns for provider/myself.  °

## 2021-12-15 NOTE — ED Provider Notes (Signed)
[facility] Provider Note   CSN: [id number] Arrival date & time: 12/15/21  1093     History  Chief Complaint  Patient presents with   Medication Refill    Cole Barrett is a 36 y.o. male.  36 year old male with history of schizophrenia presents with request for his daily risperidone dose.  Patient was in the emergency room for same yesterday morning and given dose with prescription for 15 tablets.  Patient has not filled his prescription.  He has no other complaints at this time otherwise.      Home Medications Prior to Admission medications   Medication Sig Start Date End Date Taking? Authorizing Provider  bacitracin ointment Apply 1 application topically 2 (two) times daily. 11/24/21   Mannie Stabile, PA-C  ibuprofen (ADVIL) 600 MG tablet Take 1 tablet (600 mg total) by mouth every 6 (six) hours as needed. 03/15/21   Linwood Dibbles, MD  mupirocin cream (BACTROBAN) 2 % Apply 1 application topically 2 (two) times daily. 11/22/21   Melene Plan, DO  risperiDONE (RISPERDAL) 1 MG tablet Take 1 tablet (1 mg total) by mouth at bedtime. 12/14/21   Petrucelli, Samantha R, PA-C  triamcinolone cream (KENALOG) 0.1 % Apply 1 application topically 2 (two) times daily. 05/16/21   Palumbo, April, MD  amantadine (SYMMETREL) 100 MG capsule Take 1 capsule (100 mg total) by mouth 2 (two) times daily. Patient not taking: Reported on 11/04/2019 09/01/19 11/17/19  [name], NP      Allergies    Haldol [haloperidol]    Review of Systems   Review of Systems  Constitutional:  Negative for fever.  Respiratory:  Negative for shortness of breath.   Cardiovascular:  Negative for chest pain.  Gastrointestinal:  Negative for abdominal pain.  Musculoskeletal:  Negative for arthralgias and myalgias.  Skin:  Negative for rash and wound.  Psychiatric/Behavioral:  Negative for confusion and suicidal ideas.    Physical Exam Updated Vital Signs BP 125/78 (BP  Location: Left Arm)    Pulse 84    Temp (!) 97.5 F (36.4 C) (Oral)    Resp 16    SpO2 98%  Physical Exam Vitals and nursing note reviewed.  Constitutional:      General: He is not in acute distress.    Appearance: He is well-developed. He is not diaphoretic.  HENT:     Head: Normocephalic and atraumatic.  Eyes:     Conjunctiva/sclera:     Right eye: Right conjunctiva is injected.     Left eye: Left conjunctiva is injected.  Pulmonary:     Effort: Pulmonary effort is normal.  Skin:    General: Skin is warm and dry.     Findings: No erythema or rash.  Neurological:     Mental Status: He is alert and oriented to person, place, and time.  Psychiatric:        Behavior: Behavior normal.    ED Results / Procedures / Treatments   Labs (all labs ordered are listed, but only abnormal results are displayed) Labs Reviewed - No data to display  EKG None  Radiology No results found.  Procedures Procedures    Medications Ordered in ED Medications - No data to display  ED Course/ Medical Decision Making/ A&P                           Medical Decision Making 36 year old male with history of  schizophrenia presents with request for dose of his Risperdal as above.  Patient without complaints otherwise.  Found to have injected conjunctiva but otherwise respirations are even and unlabored and patient is nontoxic in appearance.  Vitals are reviewed and reassuring.  Records reviewed, last seen in the ED yesterday morning for Risperdal dose and given prescription for his medications.  Patient is given the address for his pharmacy and encouraged to pick up his medication and take as prescribed.  Problems Addressed: Medication refill: chronic illness or injury         Final Clinical Impression(s) / ED Diagnoses Final diagnoses:  Medication refill    Rx / DC Orders ED Discharge Orders     None         Jeannie Fend, PA-C 12/15/21 [id number]    Mancel Bale, MD 12/15/21  1726

## 2021-12-16 ENCOUNTER — Emergency Department (HOSPITAL_COMMUNITY)
Admission: EM | Admit: 2021-12-16 | Discharge: 2021-12-16 | Disposition: A | Discharge status: Home / Self Care | Payer: Self-pay | Attending: Emergency Medicine | Admitting: Emergency Medicine

## 2021-12-16 ENCOUNTER — Encounter (HOSPITAL_COMMUNITY): Payer: Self-pay

## 2021-12-16 DIAGNOSIS — Z760 Encounter for issue of repeat prescription: Secondary | ICD-10-CM | POA: Insufficient documentation

## 2021-12-16 MED ORDER — HYDROCORTISONE 1 % EX CREA
TOPICAL_CREAM | Freq: Two times a day (BID) | CUTANEOUS | Status: DC
  Administered 2021-12-16: 1 via TOPICAL
  Filled 2021-12-16: qty 28

## 2021-12-16 MED ORDER — RISPERIDONE 1 MG PO TABS
1.0000 mg | ORAL_TABLET | Freq: Once | ORAL | Status: AC
Start: 2021-12-16 — End: 2021-12-16
  Administered 2021-12-16: 1 mg via ORAL
  Filled 2021-12-16: qty 1

## 2021-12-16 NOTE — ED Triage Notes (Signed)
Patient states he needs refill on his risperidone and hydrocortisone cream.

## 2021-12-16 NOTE — ED Provider Notes (Signed)
[facility]EMERGENC[facility]PT Provider Note   CSN: [id number] Arrival date & time: 12/16/21  0409     History  Chief Complaint  Patient presents with   Medication Refill    Cole Barrett is a 36 y.o. male.  The history is provided by the patient and medical records.  Medication Refill  36 year old male presenting to the ED for dose of his Risperdal and refill of his hydrocortisone cream.  He states it has been helping with his rash on his abdomen and back.  States he did have prescription sent to the pharmacy but has not been able to pick them up yet.  Home Medications Prior to Admission medications   Medication Sig Start Date End Date Taking? Authorizing Provider  bacitracin ointment Apply 1 application topically 2 (two) times daily. 11/24/21   Mannie Stabile, PA-C  ibuprofen (ADVIL) 600 MG tablet Take 1 tablet (600 mg total) by mouth every 6 (six) hours as needed. 03/15/21   Linwood Dibbles, MD  mupirocin cream (BACTROBAN) 2 % Apply 1 application topically 2 (two) times daily. 11/22/21   Melene Plan, DO  risperiDONE (RISPERDAL) 1 MG tablet Take 1 tablet (1 mg total) by mouth at bedtime. 12/14/21   Petrucelli, Samantha R, PA-C  triamcinolone cream (KENALOG) 0.1 % Apply 1 application topically 2 (two) times daily. 05/16/21   Palumbo, April, MD  amantadine (SYMMETREL) 100 MG capsule Take 1 capsule (100 mg total) by mouth 2 (two) times daily. Patient not taking: Reported on 11/04/2019 09/01/19 11/17/19  [name], NP      Allergies    Haldol [haloperidol]    Review of Systems   Review of Systems  Constitutional:        Med refill  All other systems reviewed and are negative.  Physical Exam Updated Vital Signs BP 127/80 (BP Location: Left Arm)    Pulse 85    Temp 97.6 F (36.4 C) (Oral)    Resp 20    SpO2 100%   Physical Exam Vitals and nursing note reviewed.  Constitutional:      Appearance: He is well-developed.  HENT:     Head:  Normocephalic and atraumatic.  Eyes:     Conjunctiva/sclera: Conjunctivae normal.     Pupils: Pupils are equal, round, and reactive to light.  Cardiovascular:     Rate and Rhythm: Normal rate and regular rhythm.     Heart sounds: Normal heart sounds.  Pulmonary:     Effort: Pulmonary effort is normal. No respiratory distress.     Breath sounds: Normal breath sounds. No rhonchi.  Abdominal:     General: Bowel sounds are normal.     Palpations: Abdomen is soft.     Tenderness: There is no abdominal tenderness. There is no rebound.  Musculoskeletal:        General: Normal range of motion.     Cervical back: Normal range of motion.  Skin:    General: Skin is warm and dry.  Neurological:     Mental Status: He is alert and oriented to person, place, and time.    ED Results / Procedures / Treatments   Labs (all labs ordered are listed, but only abnormal results are displayed) Labs Reviewed - No data to display  EKG None  Radiology No results found.  Procedures Procedures    Medications Ordered in ED Medications  hydrocortisone cream 1 % (1 application Topical Given 12/16/21 0522)  risperiDONE (RISPERDAL) tablet 1 mg (1 mg  Oral Given 12/16/21 0523)    ED Course/ Medical Decision Making/ A&P                           Medical Decision Making Risk Prescription drug management.   36 year old male presenting to the ED requesting dose of Risperdal and refill of his hydrocortisone cream.  These have been sent to his pharmacy but unable to pick them up yet.  Meds given in the ED, can follow-up with PCP.  Return here for new concerns.  Final Clinical Impression(s) / ED Diagnoses Final diagnoses:  Medication refill    Rx / DC Orders ED Discharge Orders     None         Garlon Hatchet, PA-C 12/16/21 0525    [name], MD 12/16/21 [phone]

## 2021-12-18 ENCOUNTER — Encounter (HOSPITAL_COMMUNITY): Payer: Self-pay

## 2021-12-18 ENCOUNTER — Emergency Department (HOSPITAL_COMMUNITY)
Admission: EM | Admit: 2021-12-18 | Discharge: 2021-12-18 | Disposition: A | Discharge status: Home / Self Care | Payer: Self-pay | Attending: Emergency Medicine | Admitting: Emergency Medicine

## 2021-12-18 ENCOUNTER — Other Ambulatory Visit: Payer: Self-pay

## 2021-12-18 DIAGNOSIS — Z760 Encounter for issue of repeat prescription: Secondary | ICD-10-CM | POA: Insufficient documentation

## 2021-12-18 DIAGNOSIS — Z79899 Other long term (current) drug therapy: Secondary | ICD-10-CM | POA: Insufficient documentation

## 2021-12-18 MED ORDER — RISPERIDONE 1 MG PO TABS
1.0000 mg | ORAL_TABLET | Freq: Once | ORAL | Status: AC
  Administered 2021-12-18: 1 mg via ORAL
  Filled 2021-12-18: qty 1

## 2021-12-18 NOTE — ED Triage Notes (Signed)
Refill on risperidone.

## 2021-12-18 NOTE — Discharge Instructions (Addendum)
Information for a PCP attached to this chart. Please make sure to pick up your prescription of Risperidone sent to [facility] at [employer].

## 2021-12-18 NOTE — ED Notes (Signed)
Pt aware someone will be up to talk with him regarding medication refill.

## 2021-12-18 NOTE — Progress Notes (Signed)
Pt has frequent visit to ED for medication refills , after reviewing chart pt refused to get medication assistance from the financial counselor at [facility]. Pt will need to follow up with  and [facility] to receive prescriptions  refills. Follow up provider is attached to pt's AVS.  Valentina Shaggy.Sanam Marmo, MSW, LCSWA [facility]   Transitions of Care Clinical Social Worker I Direct Dial: [phone]   Fax: [phone] [email or web address]

## 2021-12-18 NOTE — ED Provider Notes (Signed)
[facility]   CSN: [id number] Arrival date & time: 12/18/21  0157     History Chief Complaint  Patient presents with   Medication Refill    Cole Barrett is a 36 y.o. male presents to the ED for a dose of his Risperidone. The patient has been to the ED multiple times for this same thing. No complaints. The patient had a Risperidone prescription sent in 12/14/21. He reports he has not been able to pick up his prescription but he will be able to today. Denies any hallucinations.    Medication Refill     Home Medications Prior to Admission medications   Medication Sig Start Date End Date Taking? Authorizing Provider  bacitracin ointment Apply 1 application topically 2 (two) times daily. 11/24/21   Mannie Stabile, PA-C  ibuprofen (ADVIL) 600 MG tablet Take 1 tablet (600 mg total) by mouth every 6 (six) hours as needed. 03/15/21   Linwood Dibbles, MD  mupirocin cream (BACTROBAN) 2 % Apply 1 application topically 2 (two) times daily. 11/22/21   Melene Plan, DO  risperiDONE (RISPERDAL) 1 MG tablet Take 1 tablet (1 mg total) by mouth at bedtime. 12/14/21   Petrucelli, Samantha R, PA-C  triamcinolone cream (KENALOG) 0.1 % Apply 1 application topically 2 (two) times daily. 05/16/21   Palumbo, April, MD  amantadine (SYMMETREL) 100 MG capsule Take 1 capsule (100 mg total) by mouth 2 (two) times daily. Patient not taking: Reported on 11/04/2019 09/01/19 11/17/19  [name], NP      Allergies    Haldol [haloperidol]    Review of Systems   Review of Systems  All other systems reviewed and are negative.  Physical Exam Updated Vital Signs BP 126/76 (BP Location: Right Arm)    Pulse 85    Temp 98.1 F (36.7 C) (Oral)    Resp 15    Ht 5\' 10"  (1.778 m)    Wt 99.8 kg    SpO2 99%    BMI 31.57 kg/m  Physical Exam Constitutional:      Appearance: Normal appearance.  Eyes:     General: No scleral icterus. Pulmonary:     Effort:  Pulmonary effort is normal. No respiratory distress.  Skin:    General: Skin is dry.     Findings: No rash.  Neurological:     General: No focal deficit present.     Mental Status: He is alert. Mental status is at baseline.  Psychiatric:        Mood and Affect: Mood normal.    ED Results / Procedures / Treatments   Labs (all labs ordered are listed, but only abnormal results are displayed) Labs Reviewed - No data to display  EKG None  Radiology No results found.  Procedures Procedures   Medications Ordered in ED Medications  risperiDONE (RISPERDAL) tablet 1 mg (has no administration in time range)    ED Course/ Medical Decision Making/ A&P                           Medical Decision Making Risk Prescription drug management.   36 year old male presents to the emergency department for medication dose.  Patient has been seen here several times in the past for the same complaint.  Had a prescription sent in on 12-14-2021 to the pharmacy which he reports he has not been able to go to yet, but will be able to today.  Single dose of risperidone given here.  Vital signs are reassuring. Consulted SW for medication assistance and PCP assistance which the patient has refused to in the past. He can pick up his prescription and follow up with PCP. Patient is stable for discharge.   Final Clinical Impression(s) / ED Diagnoses Final diagnoses:  Medication refill    Rx / DC Orders ED Discharge Orders     None         Achille Rich, PA-C 12/18/21 0940    Gloris Manchester, MD 12/18/21 1743

## 2021-12-19 ENCOUNTER — Encounter (HOSPITAL_COMMUNITY): Payer: Self-pay

## 2021-12-19 ENCOUNTER — Emergency Department (HOSPITAL_COMMUNITY)
Admission: EM | Admit: 2021-12-19 | Discharge: 2021-12-20 | Disposition: A | Discharge status: Home / Self Care | Payer: Self-pay | Attending: Emergency Medicine | Admitting: Emergency Medicine

## 2021-12-19 ENCOUNTER — Emergency Department (HOSPITAL_COMMUNITY)
Admission: EM | Admit: 2021-12-19 | Discharge: 2021-12-19 | Disposition: A | Discharge status: Home / Self Care | Payer: Self-pay | Attending: Emergency Medicine | Admitting: Emergency Medicine

## 2021-12-19 ENCOUNTER — Other Ambulatory Visit: Payer: Self-pay

## 2021-12-19 DIAGNOSIS — R440 Auditory hallucinations: Secondary | ICD-10-CM | POA: Insufficient documentation

## 2021-12-19 DIAGNOSIS — Z79899 Other long term (current) drug therapy: Secondary | ICD-10-CM | POA: Insufficient documentation

## 2021-12-19 DIAGNOSIS — Z20822 Contact with and (suspected) exposure to covid-19: Secondary | ICD-10-CM | POA: Insufficient documentation

## 2021-12-19 DIAGNOSIS — R21 Rash and other nonspecific skin eruption: Secondary | ICD-10-CM | POA: Insufficient documentation

## 2021-12-19 NOTE — ED Triage Notes (Signed)
Seeking single dose of risperidone.

## 2021-12-19 NOTE — ED Triage Notes (Addendum)
Pt says his work dropped him off after having worsening auditory hallucinations. Says he has not had them this bad in a long time. Requesting to go to [facility].

## 2021-12-19 NOTE — ED Provider Notes (Signed)
[facility] Provider Note   CSN: [id number] Arrival date & time: 12/19/21  0325     History  Chief Complaint  Patient presents with   Medication Refill    Cole Barrett is a 36 y.o. male.   Medication Refill  Patient is a 36 year old gentleman well-known to the emergency department presented to the emergency room today with complaints of rash to his left flank.  He states that has been present for some time.  He is not able to give me specifics on how long it has been present seems that he has been seen for this multiple times throughout the month initially 12/01/2021 has been back in the emergency room multiple times for refills.      Home Medications Prior to Admission medications   Medication Sig Start Date End Date Taking? Authorizing Provider  bacitracin ointment Apply 1 application topically 2 (two) times daily. 11/24/21   Cole Stabile, PA-C  ibuprofen (ADVIL) 600 MG tablet Take 1 tablet (600 mg total) by mouth every 6 (six) hours as needed. 03/15/21   Cole Dibbles, MD  mupirocin cream (BACTROBAN) 2 % Apply 1 application topically 2 (two) times daily. 11/22/21   Cole Plan, DO  risperiDONE (RISPERDAL) 1 MG tablet Take 1 tablet (1 mg total) by mouth at bedtime. 12/14/21   Petrucelli, Samantha R, PA-C  triamcinolone cream (KENALOG) 0.1 % Apply 1 application topically 2 (two) times daily. 05/16/21   Palumbo, April, MD  amantadine (SYMMETREL) 100 MG capsule Take 1 capsule (100 mg total) by mouth 2 (two) times daily. Patient not taking: Reported on 11/04/2019 09/01/19 11/17/19  Cole Abrahams, NP      Allergies    Haldol [haloperidol]    Review of Systems   Review of Systems  Constitutional:  Negative for fever.  HENT:  Negative for congestion.   Respiratory:  Negative for shortness of breath.   Cardiovascular:  Negative for chest pain.  Gastrointestinal:  Negative for abdominal distention.  Skin:  Positive for rash.   Neurological:  Negative for dizziness and headaches.   Physical Exam Updated Vital Signs BP 120/75    Pulse 90    Temp 98 F (36.7 C)    Resp 18    SpO2 100%  Physical Exam Vitals and nursing note reviewed.  Constitutional:      General: He is not in acute distress.    Appearance: Normal appearance. He is not ill-appearing.  HENT:     Head: Normocephalic and atraumatic.  Eyes:     General: No scleral icterus.       Right eye: No discharge.        Left eye: No discharge.     Conjunctiva/sclera: Conjunctivae normal.  Pulmonary:     Effort: Pulmonary effort is normal.     Breath sounds: No stridor.  Skin:    Comments: Skin of left flank with some excoriations.  No ulcerations or vesicles present.  No cellulitic changes.  No abrasions lacerations or significant skin markings.  Neurological:     Mental Status: He is alert and oriented to person, place, and time. Mental status is at baseline.    ED Results / Procedures / Treatments   Labs (all labs ordered are listed, but only abnormal results are displayed) Labs Reviewed - No data to display  EKG None  Radiology No results found.  Procedures Procedures    Medications Ordered in ED Medications - No data to display  ED  Course/ Medical Decision Making/ A&P                           Medical Decision Making  Patient is a 36 year old gentleman well-known to the emergency department presented to the emergency room today with complaints of rash to his left flank.  He states that has been present for some time.  He is not able to give me specifics on how long it has been present seems that he has been seen for this multiple times throughout the month initially 12/01/2021 has been back in the emergency room multiple times for refills.  I recommended physical exam with excoriations consistent with self-inflicted scratches.  I do not see any underlying hives or rashes. Well-appearing on my examination.  Patient is requesting  hydrocortisone cream 1% he states that this is helped his symptoms in the past.  I informed patient this is an over-the-counter medicine.  We will discharge patient with recommendations to continue this cream if it is helping ultimately recommended he scratching his side.  He is not have any skin changes other than the excoriations.  I do not see any evidence of infection.  Will discharge home at this time.  Final Clinical Impression(s) / ED Diagnoses Final diagnoses:  Rash    Rx / DC Orders ED Discharge Orders     None         Cole Barrett, [place] 12/19/21 0518    Sabas Sous, MD 12/19/21 [phone]

## 2021-12-19 NOTE — Discharge Instructions (Addendum)
You may continue to use the over-the-counter 1% hydrocortisone cream you may go to any [facility] or other pharmacy or store to buy this.  I do recommend following up with your primary care doctor.  If you do not currently have 1 I recommend follow at the [facility] health wellness clinic.

## 2021-12-20 LAB — CBC WITH DIFFERENTIAL/PLATELET
Abs Immature Granulocytes: 0.07 10*3/uL (ref 0.00–0.07)
Basophils Absolute: 0 10*3/uL (ref 0.0–0.1)
Basophils Relative: 1 %
Eosinophils Absolute: 0.2 10*3/uL (ref 0.0–0.5)
Eosinophils Relative: 4 %
HCT: 40.8 % (ref 39.0–52.0)
Hemoglobin: 13 g/dL (ref 13.0–17.0)
Immature Granulocytes: 2 %
Lymphocytes Relative: 35 %
Lymphs Abs: 1.6 10*3/uL (ref 0.7–4.0)
MCH: 28.1 pg (ref 26.0–34.0)
MCHC: 31.9 g/dL (ref 30.0–36.0)
MCV: 88.1 fL (ref 80.0–100.0)
Monocytes Absolute: 0.5 10*3/uL (ref 0.1–1.0)
Monocytes Relative: 10 %
Neutro Abs: 2.2 10*3/uL (ref 1.7–7.7)
Neutrophils Relative %: 48 %
Platelets: 302 10*3/uL (ref 150–400)
RBC: 4.63 MIL/uL (ref 4.22–5.81)
RDW: 12.7 % (ref 11.5–15.5)
WBC: 4.6 10*3/uL (ref 4.0–10.5)
nRBC: 0 % (ref 0.0–0.2)

## 2021-12-20 LAB — COMPREHENSIVE METABOLIC PANEL
ALT: 31 U/L (ref 0–44)
AST: 24 U/L (ref 15–41)
Albumin: 3.9 g/dL (ref 3.5–5.0)
Alkaline Phosphatase: 46 U/L (ref 38–126)
Anion gap: 7 (ref 5–15)
BUN: 14 mg/dL (ref 6–20)
CO2: 26 mmol/L (ref 22–32)
Calcium: 8.7 mg/dL — ABNORMAL LOW (ref 8.9–10.3)
Chloride: 104 mmol/L (ref 98–111)
Creatinine, Ser: 0.95 mg/dL (ref 0.61–1.24)
GFR, Estimated: >60 mL/min (ref >60)
Glucose, Bld: 110 mg/dL — ABNORMAL HIGH (ref 70–99)
Potassium: 3.5 mmol/L (ref 3.5–5.1)
Sodium: 137 mmol/L (ref 135–145)
Total Bilirubin: 0.8 mg/dL (ref 0.3–1.2)
Total Protein: 7.2 g/dL (ref 6.5–8.1)

## 2021-12-20 LAB — RAPID URINE DRUG SCREEN, HOSP PERFORMED
Amphetamines: NOT DETECTED
Barbiturates: NOT DETECTED
Benzodiazepines: NOT DETECTED
Cocaine: NOT DETECTED
Opiates: NOT DETECTED
Tetrahydrocannabinol: NOT DETECTED

## 2021-12-20 LAB — RESP PANEL BY RT-PCR (FLU A&B, COVID) ARPGX2
Influenza A by PCR: NEGATIVE
Influenza B by PCR: NEGATIVE
SARS Coronavirus 2 by RT PCR: NEGATIVE

## 2021-12-20 LAB — ETHANOL: Alcohol, Ethyl (B): <10 mg/dL (ref <10)

## 2021-12-20 MED ORDER — RISPERIDONE 1 MG PO TABS
1.0000 mg | ORAL_TABLET | Freq: Once | ORAL | Status: AC
  Administered 2021-12-20: 1 mg via ORAL
  Filled 2021-12-20: qty 1

## 2021-12-20 NOTE — ED Provider Notes (Signed)
HPI from previous provider was reviewed.  Briefly patient has history of cannabis use, alcohol use, homelessness, schizophrenia.  Presented to the emerge about with a complaint of auditory loose Nations.  States that he has been compliant with medications but while at work today had severe auditory hallucinations.  No SI or HI.   Physical Exam  BP 132/73    Pulse 81    Temp 97.8 F (36.6 C) (Oral)    Resp 15    SpO2 98%   Physical Exam Vitals and nursing note reviewed.  Constitutional:      General: He is not in acute distress.    Appearance: He is not ill-appearing, toxic-appearing or diaphoretic.  HENT:     Head: Normocephalic.  Eyes:     General: No scleral icterus.       Right eye: No discharge.        Left eye: No discharge.  Cardiovascular:     Rate and Rhythm: Normal rate.  Pulmonary:     Effort: Pulmonary effort is normal.  Skin:    General: Skin is warm and dry.  Neurological:     General: No focal deficit present.     Mental Status: He is alert.     GCS: GCS eye subscore is 4. GCS verbal subscore is 5. GCS motor subscore is 6.  Psychiatric:        Attention and Perception: He is attentive.        Behavior: Behavior is cooperative.        Thought Content: Thought content does not include homicidal or suicidal ideation.    Procedures  Procedures  ED Course / MDM    Medical Decision Making Amount and/or Complexity of Data Reviewed Labs: ordered.  Risk Prescription drug management.   Patient was seen by TTS and psych cleared.  Note states "Quintella Reichert, NP, patient is psych cleared. Patient will follow up with outpatient medication management provider at [facility]."   Patient was informed of this and is agreeable with plan.  Requesting that he receive his Risperdal medication as he did not take it last night.  We will give patient 1 mg Risperdal.  Discussed results, findings, treatment and follow up. Patient advised of return precautions.  Patient verbalized understanding and agreed with plan.        Loni Beckwith, PA-C 12/20/21 0710    Blanchie Dessert, MD 12/21/21 [phone]

## 2021-12-20 NOTE — BH Assessment (Signed)
Comprehensive Clinical Assessment (CCA) Note  12/20/2021 [facility] [id number]  Disposition: Cole Reichert, NP, patient is psych cleared. Patient will follow up with outpatient medication management provider at [facility].  The patient demonstrates the following risk factors for suicide: Chronic risk factors for suicide include: psychiatric disorder of schizophrenia . Acute risk factors for suicide include: N/A. Protective factors for this patient include: positive social support, positive therapeutic relationship, coping skills, and hope for the future. Considering these factors, the overall suicide risk at this point appears to be moderate. Patient is appropriate for outpatient follow up.  [facility] ED from 12/19/2021 in [place] [facility] Most recent reading at 12/19/2021 11:00 PM ED from 12/19/2021 in [place] [facility] Most recent reading at 12/19/2021  3:29 AM ED from 12/18/2021 in [place] [facility] Most recent reading at 12/18/2021  5:04 AM  C-SSRS RISK CATEGORY No Risk No Risk No Risk      [facility] is a 36 year old male presenting voluntary to [facility] due to hallucinations telling him to kill himself. Patient denied SI, HI and alcohol/drug usage. Patient reported he was at work and the voices in his head worsened, so his supervisor called for police transport to take him to ED. Patient denied being depressed. Patient states that he has been compliant with his medicines but he was at work today and started to have severe auditory hallucinations that were telling him that they were going to kill him. Patient reported he missed tonight's dosage of Risperdal, however is consistently takes psych medications as prescribed. Patient reported sleep 8-10 hours and good appetite. 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 Patient denied access to guns. Patient was calm and  cooperative during assessment. Patient presented with no other concerns.   Chief Complaint:  Chief Complaint  Patient presents with   Hallucinations   Visit Diagnosis:    CCA Screening, Triage and Referral (STR)  Patient Reported Information How did you hear about [place]? Self  What Is the Reason for Your Visit/Call Today? Hallucinations  How Long Has This Been Causing You Problems? <Week  What Do You Feel Would Help You the Most Today? Medication(s)   Have You Recently Had Any Thoughts About Hurting Yourself? No  Are You Planning to Commit Suicide/Harm Yourself At This time? No   Have you Recently Had Thoughts About [place]? No  Are You Planning to Harm Someone at This Time? No  Explanation: No data recorded  Have You Used Any Alcohol or Drugs in the Past 24 Hours? No  How Long Ago Did You Use Drugs or Alcohol? No data recorded What Did You Use and How Much? No data recorded  Do You Currently Have a Therapist/Psychiatrist? No  Name of Therapist/Psychiatrist: N/A   Have You Been Recently Discharged From Any Office Practice or Programs? No  Explanation of Discharge From Practice/Program: No data recorded    CCA Screening Triage Referral Assessment Type of Contact: Tele-Assessment  Telemedicine Service Delivery:   Is this Initial or Reassessment? Initial Assessment  Date Telepsych consult ordered in [facility]:  12/20/21  Time Telepsych consult ordered in [facility]:  0215  Location of Assessment: [facility] ED  Provider Location: [facility]   Collateral Involvement: Pt states he has no one that can provide for collateral information.   Does Patient Have a Stage manager Guardian? No data recorded Name and Contact of Legal Guardian: No data  recorded If Minor and Not Living with Parent(s), Who has Custody? N/A  Is CPS involved or ever been involved? Never  Is APS involved or ever been involved? Never   Patient Determined To Be At Risk  for Harm To Self or Others Based on Review of Patient Reported Information or Presenting Complaint? No  Method: No data recorded Availability of Means: No data recorded Intent: No data recorded Notification Required: No data recorded Additional Information for Danger to Others Potential: No data recorded Additional Comments for Danger to Others Potential: No data recorded Are There Guns or Other Weapons in Your Home? No data recorded Types of Guns/Weapons: No data recorded Are These Weapons Safely Secured?                            No data recorded Who Could Verify You Are Able To Have These Secured: No data recorded Do You Have any Outstanding Charges, Pending Court Dates, Parole/Probation? No data recorded Contacted To Inform of Risk of Harm To Self or Others: -- (N/A)    Does Patient Present under Involuntary Commitment? No  IVC Papers Initial File Date: No data recorded  [place] of Residence: [name]   Patient Currently Receiving the Following Services: Not Receiving Services   Determination of Need: Routine (7 days)   Options For Referral: Medication Management     CCA Biopsychosocial Patient Reported Schizophrenia/Schizoaffective Diagnosis in Past: Yes   Strengths: Pt reported, relaxing, watching TV.   Mental Health Symptoms Depression:   None   Duration of Depressive symptoms:    Mania:   Change in energy/activity   Anxiety:    Tension   Psychosis:   Hallucinations; Affective flattening/alogia/avolition   Duration of Psychotic symptoms:    Trauma:   None   Obsessions:   None   Compulsions:   None   Inattention:   None   Hyperactivity/Impulsivity:   N/A   Oppositional/Defiant Behaviors:   None   Emotional Irregularity:   N/A   Other Mood/Personality Symptoms:   None noted    Mental Status Exam Appearance and self-care  Stature:   Average   Weight:   Average weight   Clothing:   Casual   Grooming:   Normal    Cosmetic use:   None   Posture/gait:   Normal   Motor activity:   Not Remarkable   Sensorium  Attention:   Normal   Concentration:   Normal   Orientation:   X5   Recall/memory:   Normal   Affect and Mood  Affect:   Blunted   Mood:   Anxious   Relating  Eye contact:   Normal   Facial expression:   Constricted   Attitude toward examiner:   Cooperative   Thought and Language  Speech flow:  Clear and Coherent   Thought content:   Appropriate to Mood and Circumstances   Preoccupation:   None   Hallucinations:   Auditory (Hearing voices saying they are going to kill him)   Organization:  No data recorded  [employer] of Knowledge:   Average   Intelligence:   Average   Abstraction:   Normal   Judgement:   Good   Reality Testing:   Adequate   Insight:   Fair; Gaps   Decision Making:   Normal   Social Functioning  Social Maturity:   Isolates; Responsible   Social Judgement:   Normal   Stress  Stressors:   Other (Comment) (Hallucinations)   Coping Ability:   Deficient supports   Skill Deficits:   Decision making   Supports:   Other (Comment) Administrator, arts)     Religion: Religion/Spirituality Are You A Religious Person?: No How Might This Affect Treatment?: N/A  Leisure/Recreation: Leisure / Recreation Do You Have Hobbies?: Yes Leisure and Hobbies: looking at [employer]  Exercise/Diet: Exercise/Diet Do You Exercise?: No Have You Gained or Lost A Significant Amount of Weight in the Past Six Months?: No Do You Follow a Special Diet?: No Do You Have Any Trouble Sleeping?: No   CCA Employment/Education Employment/Work Situation: Employment / Work Situation Employment Situation: Employed Patient's Job has Been Impacted by Current Illness: No Has Patient ever Been in Passenger transport manager?: No  Education: Education Last Grade Completed: 12 Did [place]?: No Did You Have An Individualized Education  Program (IIEP): No Did You Have Any Difficulty At [employer]?: No   CCA Family/Childhood History Family and Relationship History: Family history Marital status: Single Does patient have children?: No  Childhood History:  Childhood History By whom was/is the patient raised?: Both parents Did patient suffer any verbal/emotional/physical/sexual abuse as a child?: No Has patient ever been sexually abused/assaulted/raped as an adolescent or adult?: No Witnessed domestic violence?: No Has patient been affected by domestic violence as an adult?: No  Child/Adolescent Assessment:     CCA Substance Use Alcohol/Drug Use: Alcohol / Drug Use Pain Medications: Please see MAR Prescriptions: Please see MAR Over the Counter: Please see MAR History of alcohol / drug use?: No history of alcohol / drug abuse Longest period of sobriety (when/how long): N/A Negative Consequences of Use:  (Denies) Withdrawal Symptoms:  (Denies)                         ASAM's:  Six Dimensions of Multidimensional Assessment  Dimension 1:  Acute Intoxication and/or Withdrawal Potential:      Dimension 2:  Biomedical Conditions and Complications:      Dimension 3:  Emotional, Behavioral, or Cognitive Conditions and Complications:     Dimension 4:  Readiness to Change:     Dimension 5:  Relapse, Continued use, or Continued Problem Potential:     Dimension 6:  Recovery/Living Environment:     ASAM Severity Score:    ASAM Recommended Level of Treatment:     Substance use Disorder (SUD)    Recommendations for Services/Supports/Treatments: Recommendations for Services/Supports/Treatments Recommendations For Services/Supports/Treatments: Individual Therapy, Medication Management  Discharge Disposition:    DSM5 Diagnoses: Patient Active Problem List   Diagnosis Date Noted   Acute psychosis ([place]) 08/31/2019   Schizophrenia ([place]) 08/31/2019   Schizoaffective disorder, bipolar type ([place]) 01/19/2019    Alcohol abuse    Auditory hallucinations    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   Homelessness 09/03/2016   Person feigning illness 09/03/2016   Brief psychotic disorder ([place]) 08/29/2016   Cannabis use disorder, mild, abuse 08/29/2016   Pulmonary emboli ([place]) 07/13/2016   Adjustment disorder with emotional disturbance 03/12/2014   Pulmonary embolism and infarction ([place]) 01/02/2014   Acute left flank pain 01/02/2014   Pulmonary embolism, bilateral ([place]) 01/02/2014     Referrals to Alternative Service(s): Referred to Alternative Service(s):   Place:   Date:   Time:    Referred to Alternative Service(s):   Place:   Date:   Time:    Referred to Alternative Service(s):   Place:   Date:  Time:    Referred to Alternative Service(s):   Place:   Date:   Time:     Venora Maples, [facility]

## 2021-12-20 NOTE — ED Provider Notes (Signed)
[facility]-EMERGENCY DEPT Provider Note   CSN: [id number] Arrival date & time: 12/19/21  2248     History  Chief Complaint  Patient presents with   Hallucinations    [facility] is a 36 y.o. male.  HPI 36 year old male with a history of PE, cannabis use disorder, alcohol abuse, homelessness, schizophrenia presents to the ER with complaints of auditory hallucinations.  Patient states that he has been compliant with his medicines but he was at work today and started to have severe auditory hallucinations that were telling him that they were going to kill him.  He denies any SI or HI.  Denies any recent drug or alcohol use.  He states that he has required psychiatric admission in the past for this and states that he thinks that he needs to be admitted again.    Home Medications Prior to Admission medications   Medication Sig Start Date End Date Taking? Authorizing Provider  bacitracin ointment Apply 1 application topically 2 (two) times daily. 11/24/21   Mannie Stabile, PA-C  ibuprofen (ADVIL) 600 MG tablet Take 1 tablet (600 mg total) by mouth every 6 (six) hours as needed. 03/15/21   Linwood Dibbles, MD  mupirocin cream (BACTROBAN) 2 % Apply 1 application topically 2 (two) times daily. 11/22/21   Melene Plan, DO  risperiDONE (RISPERDAL) 1 MG tablet Take 1 tablet (1 mg total) by mouth at bedtime. 12/14/21   Petrucelli, Samantha R, PA-C  triamcinolone cream (KENALOG) 0.1 % Apply 1 application topically 2 (two) times daily. 05/16/21   Palumbo, April, MD  amantadine (SYMMETREL) 100 MG capsule Take 1 capsule (100 mg total) by mouth 2 (two) times daily. Patient not taking: Reported on 11/04/2019 09/01/19 11/17/19  [name], NP      Allergies    Haldol [haloperidol]    Review of Systems   Review of Systems Ten systems reviewed and are negative for acute change, except as noted in the HPI.   Physical Exam Updated Vital Signs BP (!) 146/92    Pulse  82    Temp 97.8 F (36.6 C) (Oral)    Resp 16    SpO2 95%  Physical Exam Vitals and nursing note reviewed.  Constitutional:      General: He is not in acute distress.    Appearance: He is well-developed.  HENT:     Head: Normocephalic and atraumatic.  Eyes:     Conjunctiva/sclera: Conjunctivae normal.  Cardiovascular:     Rate and Rhythm: Normal rate and regular rhythm.     Heart sounds: No murmur heard. Pulmonary:     Effort: Pulmonary effort is normal. No respiratory distress.     Breath sounds: Normal breath sounds.  Abdominal:     Palpations: Abdomen is soft.     Tenderness: There is no abdominal tenderness.  Musculoskeletal:        General: No swelling.     Cervical back: Neck supple.  Skin:    General: Skin is warm and dry.     Capillary Refill: Capillary refill takes less than 2 seconds.  Neurological:     General: No focal deficit present.     Mental Status: He is alert and oriented to person, place, and time.  Psychiatric:     Comments: Anxious appearing, however appropriate and cooperative, not responding to external stimuli, speech slightly pressured    ED Results / Procedures / Treatments   Labs (all labs ordered are listed, but only abnormal  results are displayed) Labs Reviewed  COMPREHENSIVE METABOLIC PANEL - Abnormal; Notable for the following components:      Result Value   Glucose, Bld 110 (*)    Calcium 8.7 (*)    All other components within normal limits  RESP PANEL BY RT-PCR (FLU A&B, COVID) ARPGX2  ETHANOL  CBC WITH DIFFERENTIAL/PLATELET  RAPID URINE DRUG SCREEN, HOSP PERFORMED    EKG None  Radiology No results found.  Procedures Procedures    Medications Ordered in ED Medications - No data to display  ED Course/ Medical Decision Making/ A&P                           Medical Decision Making Amount and/or Complexity of Data Reviewed Labs: ordered.  Patient presents to the ER with complaints of auditory hallucinations requiring  medical current clearance for evaluation by psychiatry.  On presentation, the patient is anxious appearing, but cooperative.  Not responding to any external stimuli.  Vitals personally reviewed by me, overall reassuring.  I personally reviewed his lab work, which did not show any significant abnormalities.  CBC without leukocytosis, normal hemoglobin.  CMP without electrolyte abnormalities, normal BUN/creatinine.  Negative acetaminophen, salicylate, ethanol.  UDS pending.  He has been medically cleared for further evaluation by TTS.  Dispo according to the recommendation.  Final Clinical Impression(s) / ED Diagnoses Final diagnoses:  Auditory hallucination    Rx / DC Orders ED Discharge Orders     None         Leone Brand 12/20/21 0247    Sabas Sous, MD 12/20/21 [phone]

## 2021-12-20 NOTE — Discharge Instructions (Signed)
You came to the emergency department today due to auditory hallucinations.  You were cleared by the psychiatric team.  Please follow-up with the [facility] for further management of your Risperdal medication.  Go to the different county [facility] for any emergent psychiatric care.  Return to the emergency department for any worsening of symptoms, new or concerning symptoms.

## 2021-12-21 ENCOUNTER — Emergency Department (HOSPITAL_COMMUNITY)
Admission: EM | Admit: 2021-12-21 | Discharge: 2021-12-21 | Disposition: A | Discharge status: Home / Self Care | Payer: Self-pay | Attending: Emergency Medicine | Admitting: Emergency Medicine

## 2021-12-21 DIAGNOSIS — Z79899 Other long term (current) drug therapy: Secondary | ICD-10-CM | POA: Insufficient documentation

## 2021-12-21 DIAGNOSIS — Z760 Encounter for issue of repeat prescription: Secondary | ICD-10-CM | POA: Insufficient documentation

## 2021-12-21 MED ORDER — RISPERIDONE 1 MG PO TABS
1.0000 mg | ORAL_TABLET | Freq: Once | ORAL | Status: AC
  Administered 2021-12-21: 04:00:00 1 mg via ORAL
  Filled 2021-12-21: qty 1

## 2021-12-21 MED ORDER — HYDROCORTISONE 1 % EX LOTN: 1.0000 "application " | TOPICAL_LOTION | Freq: Two times a day (BID) | CUTANEOUS | 0 refills | Status: DC

## 2021-12-21 MED ORDER — RISPERIDONE 1 MG PO TABS: 1.0000 mg | ORAL_TABLET | Freq: Every day | ORAL | Status: DC

## 2021-12-21 NOTE — ED Provider Notes (Signed)
[facility] EMERGENCY DEPARTMENT Provider Note   CSN: [id number] Arrival date & time: 12/21/21  3382     History Med refill  [facility] is a 36 y.o. male well known to this Ed as well as myself here for med refill. Needs his nightly dose of risperdal. Chronic non command hallucinations. States unchanged. Denies SI, HI. Also would like a refill on his anti itch cream. No allergic reaction, pain.  HPI     Home Medications Prior to Admission medications   Medication Sig Start Date End Date Taking? Authorizing Provider  hydrocortisone 1 % lotion Apply 1 application topically 2 (two) times daily. 12/21/21  Yes [place] Noori A, PA-C  bacitracin ointment Apply 1 application topically 2 (two) times daily. 11/24/21   Mannie Stabile, PA-C  ibuprofen (ADVIL) 600 MG tablet Take 1 tablet (600 mg total) by mouth every 6 (six) hours as needed. 03/15/21   Linwood Dibbles, MD  mupirocin cream (BACTROBAN) 2 % Apply 1 application topically 2 (two) times daily. 11/22/21   Melene Plan, DO  risperiDONE (RISPERDAL) 1 MG tablet Take 1 tablet (1 mg total) by mouth at bedtime. 12/14/21   Petrucelli, Samantha R, PA-C  triamcinolone cream (KENALOG) 0.1 % Apply 1 application topically 2 (two) times daily. 05/16/21   Palumbo, April, MD  amantadine (SYMMETREL) 100 MG capsule Take 1 capsule (100 mg total) by mouth 2 (two) times daily. Patient not taking: Reported on 11/04/2019 09/01/19 11/17/19  [name], NP      Allergies    Haldol [haloperidol]    Review of Systems   Review of Systems  Constitutional: Negative.   HENT: Negative.    Respiratory: Negative.    Cardiovascular: Negative.   Gastrointestinal: Negative.   Genitourinary: Negative.   Musculoskeletal: Negative.   Skin: Negative.   Neurological: Negative.   All other systems reviewed and are negative.  Physical Exam Updated Vital Signs BP 121/75    Pulse 97    Temp 99.2 F (37.3 C)    Resp 17    SpO2 98%   Physical Exam Vitals and nursing note reviewed.  Constitutional:      General: He is not in acute distress.    Appearance: He is well-developed. He is not ill-appearing, toxic-appearing or diaphoretic.  HENT:     Head: Normocephalic and atraumatic.     Nose: Nose normal.     Mouth/Throat:     Mouth: Mucous membranes are moist.  Eyes:     Pupils: Pupils are equal, round, and reactive to light.  Cardiovascular:     Rate and Rhythm: Normal rate and regular rhythm.     Pulses: Normal pulses.     Heart sounds: Normal heart sounds.  Pulmonary:     Effort: Pulmonary effort is normal. No respiratory distress.     Breath sounds: Normal breath sounds.  Abdominal:     General: Bowel sounds are normal. There is no distension.     Palpations: Abdomen is soft.  Musculoskeletal:        General: Normal range of motion.     Cervical back: Normal range of motion and neck supple.  Skin:    General: Skin is warm and dry.  Neurological:     General: No focal deficit present.     Mental Status: He is alert and oriented to person, place, and time.  Psychiatric:        Mood and Affect: Mood normal.  Behavior: Behavior normal.        Thought Content: Thought content normal.        Judgment: Judgment normal.     Comments: Denies SI, HI    ED Results / Procedures / Treatments   Labs (all labs ordered are listed, but only abnormal results are displayed) Labs Reviewed - No data to display  EKG None  Radiology No results found.  Procedures Procedures    Medications Ordered in ED Medications  risperiDONE (RISPERDAL) tablet 1 mg (has no administration in time range)   ED Course/ Medical Decision Making/ A&P    36 year old well known to ED as well as myself here for nightly dose of risperdal. Chronic non command hallucinations unchanged from baseline. Assessed by Psych 24 hours ago and cleared. He denies any SI, HI. Appear to be at baseline, pleasant. Also requesting med refill on  hydrocortisone cream. States intermittent rash to left flank. No current rash. No evidence of vesicles to suggest shingles, erythema, warmth to suggest infectious process. No urticaria to suggest allergic reaction. No bulla, desquamated skin, target lesions, intraoral lesions. No concern for SJS, TEN, TSS, tick borne illness, syphilis or other life-threatening condition.   Patient is hemodynamically stable and in no acute distress.  Patient able to ambulate in department prior to ED.  Evaluation does not show acute pathology that would require ongoing or additional emergent interventions while in the emergency department or further inpatient treatment.  I have discussed the diagnosis with the patient and answered all questions.  Pain is been managed while in the emergency department and patient has no further complaints prior to discharge.  Patient is comfortable with plan discussed in room and is stable for discharge at this time.  I have discussed strict return precautions for returning to the emergency department.  Patient was encouraged to follow-up with PCP/specialist refer to at discharge.                            Medical Decision Making         Final Clinical Impression(s) / ED Diagnoses Final diagnoses:  Medication refill    Rx / DC Orders ED Discharge Orders          Ordered    hydrocortisone 1 % lotion  2 times daily        12/21/21 0333              Christyl Osentoski A, PA-C 12/21/21 0336    Derwood Kaplan, MD 12/21/21 [phone]

## 2021-12-21 NOTE — ED Triage Notes (Signed)
Pt here for wanting refills of his risperdal  * hydrocortisone cream, pt denies pain and any other symptoms

## 2021-12-21 NOTE — ED Notes (Signed)
Pt called 2x no answer 

## 2021-12-21 NOTE — ED Notes (Signed)
Pt called 3x no answer  

## 2021-12-21 NOTE — ED Notes (Signed)
RN reviewed discharge instructions, pt had no further questions. 

## 2021-12-21 NOTE — ED Notes (Signed)
Pt found sleeping in hallway outside of triage

## 2021-12-23 ENCOUNTER — Ambulatory Visit (HOSPITAL_COMMUNITY)
Admission: EM | Admit: 2021-12-23 | Discharge: 2021-12-23 | Disposition: A | Discharge status: Home / Self Care | Payer: No Payment, Other | Attending: Nurse Practitioner | Admitting: Nurse Practitioner

## 2021-12-23 DIAGNOSIS — F209 Schizophrenia, unspecified: Secondary | ICD-10-CM | POA: Diagnosis not present

## 2021-12-23 DIAGNOSIS — Z20822 Contact with and (suspected) exposure to covid-19: Secondary | ICD-10-CM | POA: Insufficient documentation

## 2021-12-23 DIAGNOSIS — Z79899 Other long term (current) drug therapy: Secondary | ICD-10-CM | POA: Insufficient documentation

## 2021-12-23 LAB — POCT URINE DRUG SCREEN - MANUAL ENTRY (I-SCREEN)
POC Amphetamine UR: NOT DETECTED
POC Buprenorphine (BUP): NOT DETECTED
POC Cocaine UR: NOT DETECTED
POC Marijuana UR: NOT DETECTED
POC Methadone UR: NOT DETECTED
POC Methamphetamine UR: NOT DETECTED
POC Morphine: NOT DETECTED
POC Oxazepam (BZO): NOT DETECTED
POC Oxycodone UR: NOT DETECTED
POC Secobarbital (BAR): NOT DETECTED

## 2021-12-23 LAB — CBC WITH DIFFERENTIAL/PLATELET
Abs Immature Granulocytes: 0.06 10*3/uL (ref 0.00–0.07)
Basophils Absolute: 0 10*3/uL (ref 0.0–0.1)
Basophils Relative: 1 %
Eosinophils Absolute: 0.2 10*3/uL (ref 0.0–0.5)
Eosinophils Relative: 4 %
HCT: 39.8 % (ref 39.0–52.0)
Hemoglobin: 13.1 g/dL (ref 13.0–17.0)
Immature Granulocytes: 2 %
Lymphocytes Relative: 33 %
Lymphs Abs: 1.3 10*3/uL (ref 0.7–4.0)
MCH: 28.3 pg (ref 26.0–34.0)
MCHC: 32.9 g/dL (ref 30.0–36.0)
MCV: 86 fL (ref 80.0–100.0)
Monocytes Absolute: 0.3 10*3/uL (ref 0.1–1.0)
Monocytes Relative: 8 %
Neutro Abs: 2.1 10*3/uL (ref 1.7–7.7)
Neutrophils Relative %: 52 %
Platelets: 317 10*3/uL (ref 150–400)
RBC: 4.63 MIL/uL (ref 4.22–5.81)
RDW: 12.4 % (ref 11.5–15.5)
WBC: 4 10*3/uL (ref 4.0–10.5)
nRBC: 0 % (ref 0.0–0.2)

## 2021-12-23 LAB — COMPREHENSIVE METABOLIC PANEL
ALT: 30 U/L (ref 0–44)
AST: 23 U/L (ref 15–41)
Albumin: 3.9 g/dL (ref 3.5–5.0)
Alkaline Phosphatase: 47 U/L (ref 38–126)
Anion gap: 13 (ref 5–15)
BUN: 10 mg/dL (ref 6–20)
CO2: 24 mmol/L (ref 22–32)
Calcium: 8.9 mg/dL (ref 8.9–10.3)
Chloride: 102 mmol/L (ref 98–111)
Creatinine, Ser: 0.86 mg/dL (ref 0.61–1.24)
GFR, Estimated: >60 mL/min (ref >60)
Glucose, Bld: 141 mg/dL — ABNORMAL HIGH (ref 70–99)
Potassium: 3.9 mmol/L (ref 3.5–5.1)
Sodium: 139 mmol/L (ref 135–145)
Total Bilirubin: 0.6 mg/dL (ref 0.3–1.2)
Total Protein: 6.7 g/dL (ref 6.5–8.1)

## 2021-12-23 LAB — RESP PANEL BY RT-PCR (FLU A&B, COVID) ARPGX2
Influenza A by PCR: NEGATIVE
Influenza B by PCR: NEGATIVE
SARS Coronavirus 2 by RT PCR: NEGATIVE

## 2021-12-23 LAB — LIPID PANEL
Cholesterol: 225 mg/dL — ABNORMAL HIGH (ref 0–200)
HDL: 43 mg/dL (ref >40)
LDL Cholesterol: 105 mg/dL — ABNORMAL HIGH (ref 0–99)
Total CHOL/HDL Ratio: 5.2 RATIO
Triglycerides: 383 mg/dL — ABNORMAL HIGH (ref <150)
VLDL: 77 mg/dL — ABNORMAL HIGH (ref 0–40)

## 2021-12-23 LAB — TSH: TSH: 1.331 u[IU]/mL (ref 0.350–4.500)

## 2021-12-23 LAB — POC SARS CORONAVIRUS 2 AG: SARSCOV2ONAVIRUS 2 AG: NEGATIVE

## 2021-12-23 LAB — ETHANOL: Alcohol, Ethyl (B): 88 mg/dL — ABNORMAL HIGH (ref <10)

## 2021-12-23 LAB — HEMOGLOBIN A1C
Hgb A1c MFr Bld: 5.3 % (ref 4.8–5.6)
Mean Plasma Glucose: 105 mg/dL

## 2021-12-23 LAB — POC SARS CORONAVIRUS 2 AG -  ED: SARS Coronavirus 2 Ag: NEGATIVE

## 2021-12-23 MED ORDER — RISPERIDONE 1 MG PO TABS
1.0000 mg | ORAL_TABLET | Freq: Every day | ORAL | Status: DC
  Administered 2021-12-23: 1 mg via ORAL
  Filled 2021-12-23: qty 1

## 2021-12-23 MED ORDER — HYDROXYZINE HCL 25 MG PO TABS
25.0000 mg | ORAL_TABLET | Freq: Three times a day (TID) | ORAL | Status: DC | PRN
  Administered 2021-12-23: 25 mg via ORAL
  Filled 2021-12-23: qty 1

## 2021-12-23 MED ORDER — BACITRACIN-NEOMYCIN-POLYMYXIN 400-5-5000 EX OINT: TOPICAL_OINTMENT | Freq: Two times a day (BID) | CUTANEOUS | 0 refills | Status: DC

## 2021-12-23 MED ORDER — MAGNESIUM HYDROXIDE 400 MG/5ML PO SUSP: 30.0000 mL | Freq: Every day | ORAL | Status: DC | PRN

## 2021-12-23 MED ORDER — HYDROCORTISONE 1 % EX CREA
1.0000 "application " | TOPICAL_CREAM | Freq: Two times a day (BID) | CUTANEOUS | Status: DC
  Administered 2021-12-23 (×2): 1 via TOPICAL
  Filled 2021-12-23: qty 28

## 2021-12-23 MED ORDER — RISPERIDONE 1 MG PO TABS
1.0000 mg | ORAL_TABLET | Freq: Every day | ORAL | 0 refills | Status: DC
Start: 2021-12-23 — End: 2022-01-18

## 2021-12-23 MED ORDER — HYDROCORTISONE 1 % EX CREA: 1.0000 "application " | TOPICAL_CREAM | Freq: Two times a day (BID) | CUTANEOUS | 0 refills | Status: DC | PRN

## 2021-12-23 MED ORDER — HYDROXYZINE HCL 25 MG PO TABS: 25.0000 mg | ORAL_TABLET | Freq: Three times a day (TID) | ORAL | 0 refills | Status: DC | PRN

## 2021-12-23 MED ORDER — ALUM & MAG HYDROXIDE-SIMETH 200-200-20 MG/5ML PO SUSP: 30.0000 mL | ORAL | Status: DC | PRN

## 2021-12-23 MED ORDER — ACETAMINOPHEN 325 MG PO TABS: 650.0000 mg | ORAL_TABLET | Freq: Four times a day (QID) | ORAL | Status: DC | PRN

## 2021-12-23 MED ORDER — BACITRACIN-NEOMYCIN-POLYMYXIN 400-5-5000 EX OINT
TOPICAL_OINTMENT | Freq: Two times a day (BID) | CUTANEOUS | Status: DC
  Filled 2021-12-23: qty 1

## 2021-12-23 NOTE — ED Provider Notes (Signed)
FBC/OBS ASAP Discharge Summary  Date and Time: 12/23/2021 3:13 PM  Name: Cole Barrett  MRN:  [id number]   Discharge Diagnoses:  Final diagnoses:  Schizophrenia, unspecified type [facility](HCC)    Subjective:   Cole Hawkinghristopher Adam Alvizo, 36 y.o., male patient [facility] voluntarily due to worsening auditory hallucinations.  He was admitted to the continuous assessment unit for observation. He was seen face to face by this provider, consulted with Dr. Bronwen BettersLaubach; and  chart reviewed on 12/23/21.  Per chart review patient is well-known to area emergency departments and behavioral health.  He has had 72 ED visits in the last 6 months.  Patient refused UDS on admission and BAL 88.  He reports he lives alone. He has no outpatient psychiatric services in place. Patient reports when he runs out of medication his head starts  "acting funny" referencing to his auditory hallucinations, he will come to the hospital for medications.  Explained to patient that the emergency room and urgent care setting is not the appropriate setting for medication management and refills.  Discussed the importance of obtaining outpatient psychiatric provider.  Attempted to see patient as he was sleeping.  Attempted to see patient later in the day and he was taking a very long shower.  On evaluation Cole HawkingChristopher Adam Kroening is in sitting position.  He makes fair eye contact.  His speech is clear, coherent, normal rate and tone.  He is alert/oriented and cooperative.  He denies depression.  He has a euthymic affect.  States, "I feel so much better after taking the shower and getting some sleep". There is no indication that he is currently responding to internal/external stimuli or experiencing delusional thought content.  He denies paranoia.  He denies visual hallucinations.  He endorses auditory hallucinations but states they are improved after sleeping.  He continues to hear a small voice but he cannot tell what the voices  say him.  He denies SI/HI/self-harm.  He contracts for safety.  He denies access to firearms/weapons.  He is logical and has answered oceans appropriately  Stay Summary:   Patient remained calm and cooperative while on the milieu.  He is requesting to be discharged.  He exhibited no unsafe behaviors while on the unit.  He interacted with staff and other patients appropriately.  He will be discharged with printed prescriptions for Risperdal 1 mg p.o. nightly, hydroxyzine 25 mg p.o. 3 times daily as needed for anxiety, hydrocortisone cream and Neosporin ointment.  He was provided with outpatient psychiatric resources for behavioral health on the second floor, including open access walk-in hours.  Patient verbalizes understanding and agrees to follow-up.  Upon completion of this admission the Cole HawkingChristopher Adam Dilling was both mentally and medically stable for discharge denying suicidal/homicidal ideation, auditory/visual/tactile hallucinations, delusional thoughts and paranoia.      Total Time spent with patient: 20 minutes  Past Psychiatric History: see h&p Past Medical History:  Past Medical History:  Diagnosis Date   COVID-19    Homelessness    Hypercholesterolemia    Mental disorder    Paranoid behavior (HCC)    PE (pulmonary embolism)    Schizophrenia (HCC)    No past surgical history on file. Family History:  Family History  Problem Relation Age of Onset   Hypertension Mother    Family Psychiatric History: see h&p Social History:  Social History   Substance and Sexual Activity  Alcohol Use Yes   Comment: BAC not available     Social  History   Substance and Sexual Activity  Drug Use Yes   Types: Marijuana   Comment: Last used: 2 months ago UDS NA    Social History   Socioeconomic History   Marital status: Single    Spouse name: Not on file   Number of children: Not on file   Years of education: Not on file   Highest education level: Not on file  Occupational  History   Occupation: Employed    Comment: [employer]  Tobacco Use   Smoking status: Every Day    Packs/day: 0.50    Types: Cigarettes   Smokeless tobacco: Never  Vaping Use   Vaping Use: Never used  Substance and Sexual Activity   Alcohol use: Yes    Comment: BAC not available   Drug use: Yes    Types: Marijuana    Comment: Last used: 2 months ago UDS NA   Sexual activity: Never  Other Topics Concern   Not on file  Social History Narrative   ** Merged History Encounter **    Pt lives alone in [place].  He is employed.  He stated that he is [facility], but that he has not been there in a while.   Social Determinants of Health   Financial Resource Strain: Not on file  Food Insecurity: Not on file  Transportation Needs: Not on file  Physical Activity: Not on file  Stress: Not on file  Social Connections: Not on file   SDOH:  SDOH Screenings   Alcohol Screen: Not on file  Depression (PHQ2-9): Medium Risk   PHQ-2 Score: 6  Financial Resource Strain: Not on file  Food Insecurity: Not on file  Housing: Not on file  Physical Activity: Not on file  Social Connections: Not on file  Stress: Not on file  Tobacco Use: High Risk   Smoking Tobacco Use: Every Day   Smokeless Tobacco Use: Never   Passive Exposure: Not on file  Transportation Needs: Not on file    Tobacco Cessation:  N/A, patient does not currently use tobacco products  Current Medications:  Current Facility-Administered Medications  Medication Dose Route Frequency Provider Last Rate Last Admin   acetaminophen (TYLENOL) tablet 650 mg  650 mg Oral Q6H PRN [name] A, NP       alum & mag hydroxide-simeth (MAALOX/MYLANTA) 200-200-20 MG/5ML suspension 30 mL  30 mL Oral Q4H PRN [name] A, NP       hydrocortisone cream 1 % 1 application  1 application Topical BID [name] A, NP   1 application at 12/23/21 1009   hydrOXYzine (ATARAX) tablet 25 mg  25 mg Oral TID PRN Jackelyn Poling, NP    25 mg at 12/23/21 0523   magnesium hydroxide (MILK OF MAGNESIA) suspension 30 mL  30 mL Oral Daily PRN Jackelyn Poling, NP       neomycin-bacitracin-polymyxin (NEOSPORIN) ointment packet   Topical BID Jackelyn Poling, NP   Given at 12/23/21 1009   risperiDONE (RISPERDAL) tablet 1 mg  1 mg Oral QHS [name] A, NP   1 mg at 12/23/21 7824   Current Outpatient Medications  Medication Sig Dispense Refill   hydrocortisone cream 1 % Apply 1 application topically 2 (two) times daily as needed (For rash). 30 g 0   hydrOXYzine (ATARAX) 25 MG tablet Take 1 tablet (25 mg total) by mouth 3 (three) times daily as needed for anxiety or itching. 60 tablet 0   neomycin-bacitracin-polymyxin (  NEOSPORIN) ointment Apply topically 2 (two) times daily. 15 g 0   risperiDONE (RISPERDAL) 1 MG tablet Take 1 tablet (1 mg total) by mouth at bedtime. 30 tablet 0   Facility-Administered Medications Ordered in Other Encounters  Medication Dose Route Frequency Provider Last Rate Last Admin   acetaminophen (TYLENOL) tablet 650 mg  650 mg Oral Q6H PRN Chales AbrahamsMills, Shnese E, NP       alum & mag hydroxide-simeth (MAALOX/MYLANTA) 200-200-20 MG/5ML suspension 30 mL  30 mL Oral Q4H PRN Ophelia ShoulderMills, Shnese E, NP       magnesium hydroxide (MILK OF MAGNESIA) suspension 30 mL  30 mL Oral Daily PRN Chales AbrahamsMills, Shnese E, NP        PTA Medications: (Not in a hospital admission)   Musculoskeletal  Strength & Muscle Tone: within normal limits Gait & Station: normal Patient leans: N/A  Psychiatric Specialty Exam  Presentation  General Appearance: Casual  Eye Contact:Good  Speech:Clear and Coherent; Normal Rate  Speech Volume:Normal  Handedness:Right   Mood and Affect  Mood:Depressed  Affect:Congruent   Thought Process  Thought Processes:Coherent  Descriptions of Associations:Intact  Orientation:Full (Time, Place and Person)  Thought Content:Logical  Diagnosis of Schizophrenia or Schizoaffective disorder in past: Yes  Duration  of Psychotic Symptoms: Greater than six months   Hallucinations:Hallucinations: Auditory Description of Auditory Hallucinations: hers whipsers they are minimal today and he cant tell what they are saying.  Ideas of Reference:None  Suicidal Thoughts:Suicidal Thoughts: No  Homicidal Thoughts:Homicidal Thoughts: No   Sensorium  Memory:Immediate Fair; Recent Fair; Remote Fair  Judgment:Good  Insight:Good   Executive Functions  Concentration:Good  Attention Span:Good  Recall:Good  Fund of Knowledge:Good  Language:Good   Psychomotor Activity  Psychomotor Activity:Psychomotor Activity: Normal   Assets  Assets:Communication Skills; Desire for Improvement; Housing; Physical Health; Social Support   Sleep  Sleep:Sleep: Good   Nutritional Assessment (For OBS and FBC admissions only) Has the patient had a weight loss or gain of 10 pounds or more in the last 3 months?: No Has the patient had a decrease in food intake/or appetite?: No Does the patient have dental problems?: No Does the patient have eating habits or behaviors that may be indicators of an eating disorder including binging or inducing vomiting?: No Has the patient recently lost weight without trying?: 0 Has the patient been eating poorly because of a decreased appetite?: 0 Malnutrition Screening Tool Score: 0    Physical Exam  Physical Exam Vitals and nursing note reviewed.  Constitutional:      General: He is not in acute distress.    Appearance: He is well-developed.  HENT:     Head: Normocephalic and atraumatic.  Eyes:     Conjunctiva/sclera: Conjunctivae normal.  Cardiovascular:     Rate and Rhythm: Normal rate.  Pulmonary:     Effort: Pulmonary effort is normal. No respiratory distress.  Musculoskeletal:        General: Normal range of motion.     Cervical back: Normal range of motion.  Skin:    Coloration: Skin is not jaundiced or pale.  Neurological:     Mental Status: He is alert and  oriented to person, place, and time.  Psychiatric:        Attention and Perception: He perceives auditory hallucinations.        Mood and Affect: Mood is depressed.        Speech: Speech normal.        Behavior: Behavior normal. Behavior is cooperative.  Thought Content: Thought content normal.        Cognition and Memory: Cognition normal.        Judgment: Judgment normal.   Review of Systems  Constitutional: Negative.   HENT: Negative.    Eyes: Negative.   Respiratory: Negative.    Cardiovascular: Negative.   Musculoskeletal: Negative.   Skin: Negative.   Neurological: Negative.   Psychiatric/Behavioral:  Positive for depression and hallucinations.   Blood pressure 127/74, pulse 91, temperature 98.5 F (36.9 C), temperature source Oral, resp. rate 18, SpO2 96 %. There is no height or weight on file to calculate BMI.  Demographic Factors:  Male, Low socioeconomic status, and Living alone  Loss Factors: Financial problems/change in socioeconomic status  Historical Factors: Impulsivity  Risk Reduction Factors:   Sense of responsibility to family, Positive social support, Positive therapeutic relationship, and Positive coping skills or problem solving skills  Continued Clinical Symptoms:  Severe Anxiety and/or Agitation Depression:   Impulsivity Schizophrenia:   Depressive state  Cognitive Features That Contribute To Risk:  None    Suicide Risk:  Minimal: No identifiable suicidal ideation.  Patients presenting with no risk factors but with morbid ruminations; may be classified as minimal risk based on the severity of the depressive symptoms  Plan Of Care/Follow-up recommendations:  Activity:  as tolerated  Diet:  regular   Disposition:   Discharge patient   He will be discharged with printed prescriptions for Risperdal 1 mg p.o. nightly, hydroxyzine 25 mg p.o. 3 times daily as needed for anxiety, hydrocortisone cream and Neosporin ointment.  He was provided  with outpatient psychiatric resources for behavioral health on the second floor, including open access walk-in hours.  Patient verbalizes understanding and agrees to follow-up.  No evidence of imminent risk to self or others at present.    Patient does not meet criteria for psychiatric inpatient admission. Discussed crisis plan, support from social network, calling 911, coming to the Emergency Department, and calling Suicide Hotline.   Ardis Hughs, NP 12/23/2021, 3:13 PM

## 2021-12-23 NOTE — Progress Notes (Signed)
Patient discharged from unit to home.  He was given all of his property, hydrocortisone cream and AVS with instructions to follow up with provider.  He ambulated off of unit with out distress or complaint.

## 2021-12-23 NOTE — Progress Notes (Signed)
°   12/23/21 0329  [facility] Triage Screening (Walk-ins at [facility] only)  How Did You Hear About [place]? Self  What Is the Reason for Your Visit/Call Today? Patient presents voluntarily reporting worsening auditory hallucinations.  He reports he has not been to [facility] in a "while," however states he is still medication compliant and took his risperdal tonight.  Patient typically comes in requesting 1 dose of risperdal, as [place] is [place] "[place] of [place]."  Patient denies SI, HI or recent substance use.  How Long Has This Been Causing You Problems? <Week  Have You Recently Had Any Thoughts About Hurting Yourself? No  Are You Planning to Commit Suicide/Harm Yourself At This time? No  Have you Recently Had Thoughts About Hurting Someone [name]? No  Are You Planning To Harm Someone At This Time? No  Are you currently experiencing any auditory, visual or other hallucinations? Yes  Please explain the hallucinations you are currently experiencing: Patient reports voices are telling him they are going to kill him.   He reports feeling fearful and requests to be admitted tonight to "get some help."  Have You Used Any Alcohol or Drugs in the Past 24 Hours? No  Do you have any current medical co-morbidities that require immediate attention? No  Clinician description of patient physical appearance/behavior: Patient is somewhat disheveled and appears he may have walked here.  He is dressed in a t shirt and sweats.  He appears sleepy and drifted off a couple of times while waiting in the lobby.  What Do You Feel Would Help You the Most Today? Medication(s);Treatment for Depression or other mood problem  If access to [facility] was not available, would you have sought care in the Emergency Department? Yes  Determination of Need Urgent (48 hours)  Options For Referral Medication Management;BH Urgent Care

## 2021-12-23 NOTE — ED Notes (Signed)
Pt could not urinate at this time. Pt was given a specimen cup

## 2021-12-23 NOTE — ED Provider Notes (Addendum)
Behavioral Health Admission H&P [facility](FBC & OBS)  Date: 12/23/21 Patient Name: Cole Barrett MRN: [id number] Chief Complaint: No chief complaint on file.     Diagnoses:  Final diagnoses:  Schizophrenia, unspecified type (HCC)    HPI: Cole Barrett is a 36 y.o. male with a history of schizophrenia who [facility] due to worsening auditory hallucinations. He is well know to area emergency departments and behavioral health. Patient frequently presents to the ED or [facility] late at night. He has had 72 ED visits in the last 6 months. On evaluation, patient is drowsy, easily aroused. He is oriented x 4. Speech is clear and coherent. Patient states "the voices are getting worse and louder. I need some help."  Patient states that he was "on this side of town and left my Risperdal at home." Patient reports AH of voices that tell they are going to kill him. This is consistent with his multiple previous visits. He denies visual hallucinations. He does not appear to be responding to internal stimuli. Patient denies suicidal ideations. Denies history of suicide attempts. Denies access to guns/weapons. Denies homicidal ideations. Denies substance abuse. Refused UDS. BAL 88.  Patient reports that he is prescribed Risperdal 3 mg QHS. Per PTA meds, the patient was recently prescribed risperdal 1 mg #14 in the ED. Unclear if he picked up this prescription. Patient states that he no longer goes to [facility].   Patient reports that he lives alone. 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   Patient requests admission due to worsening AH.  PHQ 2-9:  [facility]  Thoughts that you would be better off dead, or of hurting yourself in some way Not at all Not at all  PHQ-9 Total Score 0 0       Flowsheet Row [facility]  [facility] Most recent reading at 12/21/2021  3:30 AM ED [facility] Most recent reading at 12/19/2021 11:00 PM [facility] Most recent reading at 12/19/2021  3:29 AM  C-SSRS RISK CATEGORY No Risk No Risk No Risk        Total Time spent with patient: 20 minutes  Musculoskeletal  Strength & Muscle Tone: within normal limits Gait & Station: normal Patient leans: N/A  Psychiatric Specialty Exam  Presentation General Appearance: Disheveled; Appropriate for Environment  Eye Contact:Fair  Speech:Clear and Coherent; Normal Rate  Speech Volume:Normal  Handedness:Right   Mood and Affect  Mood:Depressed  Affect:Blunt   Thought Process  Thought Processes:Coherent  Descriptions of Associations:Intact  Orientation:Full (Time, Place and Person)  Thought Content:Logical  Diagnosis of Schizophrenia or Schizoaffective disorder in past: Yes  Duration of Psychotic Symptoms: Greater than six months  Hallucinations:Hallucinations: Auditory Description of Auditory Hallucinations: hears vocies that tell they are going to kill him  Ideas of Reference:None  Suicidal Thoughts:Suicidal Thoughts: No  Homicidal Thoughts:Homicidal Thoughts: No   Sensorium  Memory:Immediate Fair; Recent Fair; Remote Fair  Judgment:Good  Insight:Fair   Executive Functions  Concentration:Fair  Attention Span:Fair  Recall:Fair  Fund of Knowledge:Fair  Language:Good   Psychomotor Activity  Psychomotor Activity:Psychomotor Activity: Normal   Assets  Assets:Communication Skills; Desire for Improvement; Housing; Physical Health   Sleep  Sleep:Sleep: Good   Nutritional Assessment (For OBS and FBC admissions only)  Has the patient had a weight loss or gain of 10 pounds or more in the last 3 months?: No Has the patient had a  decrease in food intake/or appetite?: No Does the patient have dental problems?: No Does the patient have eating habits or behaviors that may be indicators of an eating disorder including binging or inducing vomiting?: No Has the patient recently lost weight without trying?: 0 Has the patient been eating poorly because of a decreased appetite?: 0 Malnutrition Screening Tool Score: 0    Physical Exam Constitutional:      General: He is not in acute distress.    Appearance: He is not ill-appearing, toxic-appearing or diaphoretic.  HENT:     Head: Normocephalic.     Right Ear: External ear normal.     Left Ear: External ear normal.  Eyes:     Pupils: Pupils are equal, round, and reactive to light.  Cardiovascular:     Rate and Rhythm: Normal rate.  Pulmonary:     Effort: Pulmonary effort is normal. No respiratory distress.  Musculoskeletal:        General: Normal range of motion.  Skin:    General: Skin is warm and dry.     Findings: Lesion present.     Comments: Patient has multiple lesions of various stages of healing on abdomen, legs, arms, and back. These have been evaluated in the ED on multiple occassions.   Neurological:     Mental Status: He is alert and oriented to person, place, and time.  Psychiatric:        Mood and Affect: Mood is depressed.        Speech: Speech normal.        Behavior: Behavior is cooperative.        Thought Content: Thought content is not paranoid or delusional. Thought content does not include homicidal or suicidal ideation. Thought content does not include suicidal plan.   Review of Systems  Constitutional:  Negative for chills, diaphoresis, fever, malaise/fatigue and weight loss.  HENT:  Negative for congestion.   Respiratory:  Negative for cough, shortness of breath and wheezing.   Cardiovascular:  Negative for chest pain and palpitations.  Gastrointestinal:  Negative for diarrhea, nausea and vomiting.  Skin:  Positive for itching and rash.   Neurological:  Negative for dizziness and seizures.  Psychiatric/Behavioral:  Positive for depression and hallucinations. Negative for memory loss, substance abuse and suicidal ideas. The patient is not nervous/anxious and does not have insomnia.   All other systems reviewed and are negative.  Blood pressure 127/74, pulse 91, temperature 98.5 F (36.9 C), temperature source Oral, resp. rate 18, SpO2 96 %. There is no height or weight on file to calculate BMI.  Past Psychiatric History: Schizophrenia  Is the patient at risk to self? No  Has the patient been a risk to self in the past 6 months? No .    Has the patient been a risk to self within the distant past? No   Is the patient a risk to others? No   Has the patient been a risk to others in the past 6 months? No   Has the patient been a risk to others within the distant past? No   Past Medical History:  Past Medical History:  Diagnosis Date   COVID-19    Homelessness    Hypercholesterolemia    Mental disorder    Paranoid behavior (HCC)    PE (pulmonary embolism)    Schizophrenia (  HCC)    No past surgical history on file.  Family History:  Family History  Problem Relation Age of Onset   Hypertension Mother     Social History:  Social History   Socioeconomic History   Marital status: Single    Spouse name: Not on file   Number of children: Not on file   Years of education: Not on file   Highest education level: Not on file  Occupational History   Occupation: Employed    Comment: [employer]  Tobacco Use   Smoking status: Every Day    Packs/day: 0.50    Types: Cigarettes   Smokeless tobacco: Never  Vaping Use   Vaping Use: Never used  Substance and Sexual Activity   Alcohol use: Yes    Comment: BAC not available   Drug use: Yes    Types: Marijuana    Comment: Last used: 2 months ago UDS NA   Sexual activity: Never  Other Topics Concern   Not on file  Social History Narrative   ** Merged History  Encounter **    Pt lives alone in [place].  He is employed.  He stated that he is followed by [facility], but that he has not been there in a while.   Social Determinants of Health   Financial Resource Strain: Not on file  Food Insecurity: Not on file  Transportation Needs: Not on file  Physical Activity: Not on file  Stress: Not on file  Social Connections: Not on file  Intimate Partner Violence: Not on file    SDOH:  SDOH Screenings   Alcohol Screen: Not on file  Depression (PHQ2-9): Low Risk    PHQ-2 Score: 0  Financial Resource Strain: Not on file  Food Insecurity: Not on file  Housing: Not on file  Physical Activity: Not on file  Social Connections: Not on file  Stress: Not on file  Tobacco Use: High Risk   Smoking Tobacco Use: Every Day   Smokeless Tobacco Use: Never   Passive Exposure: Not on file  Transportation Needs: Not on file    Last Labs:  Admission on 12/19/2021, Discharged on 12/20/2021  Component Date Value Ref Range Status   SARS Coronavirus 2 by RT PCR 12/19/2021 NEGATIVE  NEGATIVE Final   Comment: (NOTE) SARS-CoV-2 target nucleic acids are NOT DETECTED.  The SARS-CoV-2 RNA is generally detectable in upper respiratory specimens during the acute phase of infection. The lowest concentration of SARS-CoV-2 viral copies this assay can detect is 138 copies/mL. A negative result does not preclude SARS-Cov-2 infection and should not be used as the sole basis for treatment or other patient management decisions. A negative result may occur with  improper specimen collection/handling, submission of specimen other than nasopharyngeal swab, presence of viral mutation(s) within the areas targeted by this assay, and inadequate number of viral copies(<138 copies/mL). A negative result must be combined with clinical observations, patient history, and epidemiological information. The expected result is Negative.  Fact Sheet for Patients:   BloggerCourse.com  Fact Sheet for Healthcare Providers:  SeriousBroker.it  This test is no                          t yet approved or cleared by the [place] FDA and  has been authorized for detection and/or diagnosis of SARS-CoV-2 by FDA under an Emergency Use Authorization (EUA). This EUA will remain  in effect (meaning this test can be used) for the  duration of the COVID-19 declaration under Section 564(b)(1) of the Act, 21 U.S.C.section 360bbb-3(b)(1), unless the authorization is terminated  or revoked sooner.       Influenza A by PCR 12/19/2021 NEGATIVE  NEGATIVE Final   Influenza B by PCR 12/19/2021 NEGATIVE  NEGATIVE Final   Comment: (NOTE) The Xpert Xpress SARS-CoV-2/FLU/RSV plus assay is intended as an aid in the diagnosis of influenza from Nasopharyngeal swab specimens and should not be used as a sole basis for treatment. Nasal washings and aspirates are unacceptable for Xpert Xpress SARS-CoV-2/FLU/RSV testing.  Fact Sheet for Patients: BloggerCourse.com  Fact Sheet for Healthcare Providers: SeriousBroker.it  This test is not yet approved or cleared by the [place] FDA and has been authorized for detection and/or diagnosis of SARS-CoV-2 by FDA under an Emergency Use Authorization (EUA). This EUA will remain in effect (meaning this test can be used) for the duration of the COVID-19 declaration under Section 564(b)(1) of the Act, 21 U.S.C. section 360bbb-3(b)(1), unless the authorization is terminated or revoked.  Performed at [facility], [address] [place], [place] [address]    Sodium 12/19/2021 137  135 - 145 mmol/L Final   Potassium 12/19/2021 3.5  3.5 - 5.1 mmol/L Final   Chloride 12/19/2021 104  98 - 111 mmol/L Final   CO2 12/19/2021 26  22 - 32 mmol/L Final   Glucose, Bld 12/19/2021 110 (H)  70 - 99 mg/dL Final   Glucose  reference range applies only to samples taken after fasting for at least 8 hours.   BUN 12/19/2021 14  6 - 20 mg/dL Final   Creatinine, Ser 12/19/2021 0.95  0.61 - 1.24 mg/dL Final   Calcium 19/14/7829 8.7 (L)  8.9 - 10.3 mg/dL Final   Total Protein 56/21/3086 7.2  6.5 - 8.1 g/dL Final   Albumin 57/84/6962 3.9  3.5 - 5.0 g/dL Final   AST 95/28/4132 24  15 - 41 U/L Final   ALT 12/19/2021 31  0 - 44 U/L Final   Alkaline Phosphatase 12/19/2021 46  38 - 126 U/L Final   Total Bilirubin 12/19/2021 0.8  0.3 - 1.2 mg/dL Final   GFR, Estimated 12/19/2021 >60  >60 mL/min Final   Comment: (NOTE) Calculated using the CKD-EPI Creatinine Equation (2021)    Anion gap 12/19/2021 7  5 - 15 Final   Performed at [facility], [address] [place], [place] [address]   Alcohol, Ethyl (B) 12/19/2021 <10  <10 mg/dL Final   Comment: (NOTE) Lowest detectable limit for serum alcohol is 10 mg/dL.  For medical purposes only. Performed at [facility], [address], [place], [place] [address]    Opiates 12/20/2021 NONE DETECTED  NONE DETECTED Final   Cocaine 12/20/2021 NONE DETECTED  NONE DETECTED Final   Benzodiazepines 12/20/2021 NONE DETECTED  NONE DETECTED Final   Amphetamines 12/20/2021 NONE DETECTED  NONE DETECTED Final   Tetrahydrocannabinol 12/20/2021 NONE DETECTED  NONE DETECTED Final   Barbiturates 12/20/2021 NONE DETECTED  NONE DETECTED Final   Comment: (NOTE) DRUG SCREEN FOR MEDICAL PURPOSES ONLY.  IF CONFIRMATION IS NEEDED FOR ANY PURPOSE, NOTIFY LAB WITHIN 5 DAYS.  LOWEST DETECTABLE LIMITS FOR URINE DRUG SCREEN Drug Class                     Cutoff (ng/mL) Amphetamine and metabolites    1000 Barbiturate and metabolites    200 Benzodiazepine  200 Tricyclics and metabolites     300 Opiates and metabolites        300 Cocaine and metabolites        300 THC                            50 Performed at [facility],  [address] [place], [place] [address]    WBC 12/19/2021 4.6  4.0 - 10.5 K/uL Final   RBC 12/19/2021 4.63  4.22 - 5.81 MIL/uL Final   Hemoglobin 12/19/2021 13.0  13.0 - 17.0 g/dL Final   HCT 65/78/4696 40.8  39.0 - 52.0 % Final   MCV 12/19/2021 88.1  80.0 - 100.0 fL Final   MCH 12/19/2021 28.1  26.0 - 34.0 pg Final   MCHC 12/19/2021 31.9  30.0 - 36.0 g/dL Final   RDW 29/52/8413 12.7  11.5 - 15.5 % Final   Platelets 12/19/2021 302  150 - 400 K/uL Final   nRBC 12/19/2021 0.0  0.0 - 0.2 % Final   Neutrophils Relative % 12/19/2021 48  % Final   Neutro Abs 12/19/2021 2.2  1.7 - 7.7 K/uL Final   Lymphocytes Relative 12/19/2021 35  % Final   Lymphs Abs 12/19/2021 1.6  0.7 - 4.0 K/uL Final   Monocytes Relative 12/19/2021 10  % Final   Monocytes Absolute 12/19/2021 0.5  0.1 - 1.0 K/uL Final   Eosinophils Relative 12/19/2021 4  % Final   Eosinophils Absolute 12/19/2021 0.2  0.0 - 0.5 K/uL Final   Basophils Relative 12/19/2021 1  % Final   Basophils Absolute 12/19/2021 0.0  0.0 - 0.1 K/uL Final   Immature Granulocytes 12/19/2021 2  % Final   Abs Immature Granulocytes 12/19/2021 0.07  0.00 - 0.07 K/uL Final   Performed at [facility], [address] [place], [place] [address]    Allergies: Haldol [haloperidol]  PTA Medications:  Prior to Admission medications   Medication Sig Start Date End Date Taking? Authorizing Provider  bacitracin ointment Apply 1 application topically 2 (two) times daily. 11/24/21   Mannie Stabile, PA-C  hydrocortisone 1 % lotion Apply 1 application topically 2 (two) times daily. 12/21/21   Henderly, Britni A, PA-C  ibuprofen (ADVIL) 600 MG tablet Take 1 tablet (600 mg total) by mouth every 6 (six) hours as needed. 03/15/21   Linwood Dibbles, MD  mupirocin cream (BACTROBAN) 2 % Apply 1 application topically 2 (two) times daily. 11/22/21   Melene Plan, DO  risperiDONE (RISPERDAL) 1 MG tablet Take 1 tablet (1 mg total) by mouth at bedtime. 12/14/21    Petrucelli, Samantha R, PA-C  triamcinolone cream (KENALOG) 0.1 % Apply 1 application topically 2 (two) times daily. 05/16/21   Palumbo, April, MD  amantadine (SYMMETREL) 100 MG capsule Take 1 capsule (100 mg total) by mouth 2 (two) times daily. Patient not taking: Reported on 11/04/2019 09/01/19 11/17/19  [name], NP      Medical Decision Making  Considering the patient typically does not request admission and he is very drowsy, we will admit to continuous assessment for crisis stabilization  Continue home medications -risperdal 1 mg QHS for AH -hydrocortisone cream 1% BID for itching -neosporin ointment BID for wounds   Lab Orders         Resp Panel by RT-PCR (Flu A&B, Covid) Nasopharyngeal Swab         CBC with Differential/Platelet  Comprehensive metabolic panel         Hemoglobin A1c         Lipid panel         TSH         Ethanol         POC SARS Coronavirus 2 Ag-ED - Nasal Swab         POCT Urine Drug Screen - (ICup)        Clinical Course as of 12/23/21 0719  Thu Dec 23, 2021  0718 Alcohol, Ethyl (B)(!): 88 [JB]    Clinical Course User Index [JB] Jackelyn PolingBerry, York Valliant A, NP    Recommendations  Based on my evaluation the patient does not appear to have an emergency medical condition.  Jackelyn PolingJason A Jatorian Renault, NP 12/23/21  4:06 AM

## 2021-12-23 NOTE — Progress Notes (Signed)
Patient awoke for a short time.  Offered and refused breakfast as he was not satisfued with the selection.  Patient asked for shower supplies and was supported in showering as he is malodorous.  Patient then lay back down on his bed and went back to sleep prior to showering.  He remains asleep snoring loudly at present.  Will monitor and provide safe environment.

## 2021-12-23 NOTE — Progress Notes (Signed)
Patient is presently asleep in bed snorring loudly.  Will monitor and provide safe environment .

## 2021-12-23 NOTE — BH Assessment (Incomplete)
Comprehensive Clinical Assessment (CCA) Note  12/23/2021 Cole Barrett [id number]  Disposition: Per Lindon Romp, NP admission to Continuous Assessment at [facility] is recommended for safety, stabilization, to get dose of Rx meds with AM reassessment by psychiatry.   The patient demonstrates the following risk factors for suicide: Chronic risk factors for suicide include: psychiatric disorder of Schizophrenia and demographic factors (male, >38 y/o). Acute risk factors for suicide include: N/A. Protective factors for this patient include: positive social support, coping skills, hope for the future, and life satisfaction. Considering these factors, the overall suicide risk at this point appears to be low. Patient is appropriate for outpatient follow up.  Patient is a 36 year old male with a history of Schizophrenis who presents voluntarily to [facility] for assessment.  Patient presents reporting worsening auditory hallucinations tonight. He states the voices are telling him they will harm him.  Patient is well known to [facility] and ED providers, as he either comes to [facility] or to ED's requesting a dose of his risperdal, often stating he's "on this side of town and forgot my medicine.  He reports he has not been to [facility] in a "while," however states he is still medication compliant and took his risperdal tonight.  Patient denies SI, HI or recent substance use.   Chief Complaint: worsening auditory hallucinations  Visit Diagnosis: Schizophrenia, unspecified   Flowsheet Row ED from 12/23/2021 in [facility] ED from 12/26/2020 in [place] ED from 06/05/2020 in [place]  Thoughts that you would be better off dead, or of hurting yourself in some way Not at all Not at all Not at all  PHQ-9 Total Score 6 0 0      Flowsheet Row ED from 12/23/2021 in [facility] ED from 12/21/2021 in [place] ED from 12/19/2021 in [place] DEPT  C-SSRS RISK CATEGORY No Risk No Risk No Risk        CCA Screening, Triage and Referral (STR)  Patient Reported Information How did you hear about [place]? Self  What Is the Reason for Your Visit/Call Today? Patient presents voluntarily reporting worsening auditory hallucinations.  He reports he has not been to [facility] in a "while," however states he is still medication compliant and took his risperdal tonight.  Patient typically comes in requesting 1 dose of risperdal, as he is on "this side of town."  Patient denies SI, HI or recent substance use.  How Long Has This Been Causing You Problems? <Week  What Do You Feel Would Help You the Most Today? Medication(s); Treatment for Depression or other mood problem   Have You Recently Had Any Thoughts About Hurting Yourself? No  Are You Planning to Commit Suicide/Harm Yourself At This time? No   Have you Recently Had Thoughts About [place]? No  Are You Planning to Harm Someone at This Time? No  Explanation: No data recorded  Have You Used Any Alcohol or Drugs in the Past 24 Hours? No  How Long Ago Did You Use Drugs or Alcohol? No data recorded What Did You Use and How Much? No data recorded  Do You Currently Have a Therapist/Psychiatrist? Yes  Name of Therapist/Psychiatrist: [facility] - "haven't been in a while"   Have You Been Recently Discharged From Any Office Practice or Programs? No  Explanation of Discharge From Practice/Program: No data recorded  CCA Screening Triage Referral Assessment Type of Contact: Face-to-Face  Telemedicine Service Delivery:   Is this Initial or Reassessment? Initial Assessment  Date Telepsych consult ordered in [facility]:  12/20/21  Time Telepsych consult ordered in [facility]:  0215  Location of Assessment: [facility] Assessment Services  Provider  Location: [facility] Assessment Services   Collateral Involvement: Pt states he has no one that can provide for collateral information.   Does Patient Have a Stage manager Guardian? No data recorded Name and Contact of Legal Guardian: No data recorded If Minor and Not Living with Parent(s), Who has Custody? N/A  Is CPS involved or ever been involved? Never  Is APS involved or ever been involved? Never   Patient Determined To Be At Risk for Harm To Self or Others Based on Review of Patient Reported Information or Presenting Complaint? No  Method: No data recorded Availability of Means: No data recorded Intent: No data recorded Notification Required: No data recorded Additional Information for Danger to Others Potential: No data recorded Additional Comments for Danger to Others Potential: No data recorded Are There Guns or Other Weapons in Your Home? No data recorded Types of Guns/Weapons: No data recorded Are These Weapons Safely Secured?                            No data recorded Who Could Verify You Are Able To Have These Secured: No data recorded Do You Have any Outstanding Charges, Pending Court Dates, Parole/Probation? No data recorded Contacted To Inform of Risk of Harm To Self or Others: -- (N/A)    Does Patient Present under Involuntary Commitment? No  IVC Papers Initial File Date: No data recorded  [place] of Residence: [name]   Patient Currently Receiving the Following Services: Not Receiving Services (states he hasn't been to [place] in a while)   Determination of Need: Urgent (48 hours)   Options For Referral: Medication Management; [facility]     CCA Biopsychosocial Patient Reported Schizophrenia/Schizoaffective Diagnosis in Past: Yes   Strengths: Seeking treatment   Mental Health Symptoms Depression:   None   Duration of Depressive symptoms:    Mania:   Change in energy/activity   Anxiety:    Tension   Psychosis:    Hallucinations   Duration of Psychotic symptoms:    Trauma:   None   Obsessions:   None   Compulsions:   None   Inattention:   None   Hyperactivity/Impulsivity:   N/A   Oppositional/Defiant Behaviors:   None   Emotional Irregularity:   N/A   Other Mood/Personality Symptoms:   None noted    Mental Status Exam Appearance and self-care  Stature:   Average   Weight:   Average weight   Clothing:   Disheveled   Grooming:   Neglected   Cosmetic use:   None   Posture/gait:   Normal   Motor activity:   Slowed   Sensorium  Attention:   Normal   Concentration:   Normal   Orientation:   X5   Recall/memory:   Normal   Affect and Mood  Affect:   Blunted   Mood:   Anxious   Relating  Eye contact:   Normal   Facial expression:   Constricted   Attitude toward examiner:   Cooperative   Thought and Language  Speech flow:  Clear and Coherent   Thought content:   Appropriate to Mood and Circumstances  Preoccupation:   None   Hallucinations:   Auditory (Hearing voices saying they are going to kill him)   Organization:  No data recorded  [employer] of Knowledge:   Average   Intelligence:   Average   Abstraction:   Normal   Judgement:   Good   Reality Testing:   Adequate   Insight:   Fair; Gaps   Decision Making:   Normal   Social Functioning  Social Maturity:   Isolates; Responsible   Social Judgement:   Normal   Stress  Stressors:   Other (Comment) (Hallucinations)   Coping Ability:   Deficient supports   Skill Deficits:   Decision making   Supports:   Other (Comment) Administrator, arts)     Religion: Religion/Spirituality Are You A Religious Person?: No How Might This Affect Treatment?: N/A  Leisure/Recreation: Leisure / Recreation Do You Have Hobbies?: Yes Leisure and Hobbies: looking at [employer]  Exercise/Diet: Exercise/Diet Do You Exercise?: No Have You Gained or Lost A  Significant Amount of Weight in the Past Six Months?: No Do You Follow a Special Diet?: No Do You Have Any Trouble Sleeping?: No   CCA Employment/Education Employment/Work Situation:    Education:     CCA Family/Childhood History Family and Relationship History: Family history Marital status: Single Does patient have children?: No  Childhood History:  Childhood History By whom was/is the patient raised?: Both parents Did patient suffer any verbal/emotional/physical/sexual abuse as a child?: No Did patient suffer from severe childhood neglect?: No Has patient ever been sexually abused/assaulted/raped as an adolescent or adult?: No Was the patient ever a victim of a crime or a disaster?: No Witnessed domestic violence?: No Has patient been affected by domestic violence as an adult?: No  Child/Adolescent Assessment:     CCA Substance Use Alcohol/Drug Use: Alcohol / Drug Use Pain Medications: Please see MAR Prescriptions: Please see MAR Over the Counter: Please see MAR History of alcohol / drug use?: No history of alcohol / drug abuse Longest period of sobriety (when/how long): N/A Negative Consequences of Use:  (Denies) Withdrawal Symptoms:  (Denies)      ASAM's:  Six Dimensions of Multidimensional Assessment  Dimension 1:  Acute Intoxication and/or Withdrawal Potential:      Dimension 2:  Biomedical Conditions and Complications:      Dimension 3:  Emotional, Behavioral, or Cognitive Conditions and Complications:     Dimension 4:  Readiness to Change:     Dimension 5:  Relapse, Continued use, or Continued Problem Potential:     Dimension 6:  Recovery/Living Environment:     ASAM Severity Score:    ASAM Recommended Level of Treatment:     Substance use Disorder (SUD)    Recommendations for Services/Supports/Treatments: Recommendations for Services/Supports/Treatments Recommendations For Services/Supports/Treatments: Individual Therapy, Medication  Management  Discharge Disposition:    DSM5 Diagnoses: Patient Active Problem List   Diagnosis Date Noted   Acute psychosis ([place]) 08/31/2019   Schizophrenia ([place]) 08/31/2019   Schizoaffective disorder, bipolar type ([place]) 01/19/2019   Alcohol abuse    Auditory hallucinations    Alcohol abuse with alcohol-induced mood disorder ([place]) 09/08/2016   Homelessness 09/03/2016   Person feigning illness 09/03/2016   Brief psychotic disorder ([place]) 08/29/2016   Cannabis use disorder, mild, abuse 08/29/2016   Pulmonary emboli ([place]) 07/13/2016   Adjustment disorder with emotional disturbance 03/12/2014   Pulmonary embolism and infarction ([place]) 01/02/2014   Acute left flank pain 01/02/2014   Pulmonary embolism, bilateral ([place])  01/02/2014     Referrals to Alternative Service(s): Referred to Alternative Service(s):   Place:   Date:   Time:    Referred to Alternative Service(s):   Place:   Date:   Time:    Referred to Alternative Service(s):   Place:   Date:   Time:    Referred to Alternative Service(s):   Place:   Date:   Time:     ANTOINIO GOBBI, [facility]

## 2021-12-23 NOTE — Discharge Instructions (Addendum)

## 2021-12-23 NOTE — ED Notes (Signed)
°  Pt A&O x 4, presents with audiory hallucinations, hearing voices telling him they are going to kill him.  Denies SI, HI.  Monitoring for safety.  Pt calm & cooperative, no distress noted.

## 2021-12-24 ENCOUNTER — Encounter (HOSPITAL_COMMUNITY): Payer: Self-pay

## 2021-12-24 ENCOUNTER — Other Ambulatory Visit: Payer: Self-pay

## 2021-12-24 ENCOUNTER — Emergency Department (HOSPITAL_COMMUNITY)
Admission: EM | Admit: 2021-12-24 | Discharge: 2021-12-24 | Disposition: A | Discharge status: Home / Self Care | Payer: Self-pay | Attending: Emergency Medicine | Admitting: Emergency Medicine

## 2021-12-24 DIAGNOSIS — Z79899 Other long term (current) drug therapy: Secondary | ICD-10-CM

## 2021-12-24 DIAGNOSIS — Z760 Encounter for issue of repeat prescription: Secondary | ICD-10-CM | POA: Insufficient documentation

## 2021-12-24 DIAGNOSIS — R21 Rash and other nonspecific skin eruption: Secondary | ICD-10-CM | POA: Insufficient documentation

## 2021-12-24 MED ORDER — HYDROCORTISONE 1 % EX CREA
TOPICAL_CREAM | Freq: Two times a day (BID) | CUTANEOUS | Status: DC
  Filled 2021-12-24: qty 28

## 2021-12-24 MED ORDER — RISPERIDONE 1 MG PO TABS
1.0000 mg | ORAL_TABLET | Freq: Once | ORAL | Status: AC
  Administered 2021-12-24: 1 mg via ORAL
  Filled 2021-12-24: qty 1

## 2021-12-24 NOTE — ED Provider Notes (Signed)
[facility]-EMERGENCY DEPT Provider Note   CSN: [id number] Arrival date & time: 12/24/21  0249     History  Chief Complaint  Patient presents with   Medication Refill    Giancarlos Berendt is a 36 y.o. male.  The history is provided by the patient and medical records.  Medication Refill  36 y.o. M presenting to the ED requesting dose of his risperdal and new tube of hydrocortisone cream.  He has been using this for chronic rash.  He denies any SI or HI.  He is well known to this hospital system for same, 73 visits in the past 6 months for same.  Home Medications Prior to Admission medications   Medication Sig Start Date End Date Taking? Authorizing Provider  hydrocortisone cream 1 % Apply 1 application topically 2 (two) times daily as needed (For rash). 12/23/21   Ardis Hughs, NP  hydrOXYzine (ATARAX) 25 MG tablet Take 1 tablet (25 mg total) by mouth 3 (three) times daily as needed for anxiety or itching. 12/23/21   Ardis Hughs, NP  neomycin-bacitracin-polymyxin (NEOSPORIN) ointment Apply topically 2 (two) times daily. 12/23/21   Ardis Hughs, NP  risperiDONE (RISPERDAL) 1 MG tablet Take 1 tablet (1 mg total) by mouth at bedtime. 12/23/21   Ardis Hughs, NP  amantadine (SYMMETREL) 100 MG capsule Take 1 capsule (100 mg total) by mouth 2 (two) times daily. Patient not taking: Reported on 11/04/2019 09/01/19 11/17/19  [name], NP      Allergies    Patient has no known allergies.    Review of Systems   Review of Systems  Constitutional:        Med refill  All other systems reviewed and are negative.  Physical Exam Updated Vital Signs BP 131/83 (BP Location: Left Arm)    Pulse 72    Temp 98 F (36.7 C) (Oral)    Resp 18    SpO2 100%   Physical Exam Vitals and nursing note reviewed.  Constitutional:      Appearance: He is well-developed.  HENT:     Head: Normocephalic and atraumatic.  Eyes:     Conjunctiva/sclera:  Conjunctivae normal.     Pupils: Pupils are equal, round, and reactive to light.  Cardiovascular:     Rate and Rhythm: Normal rate and regular rhythm.     Heart sounds: Normal heart sounds.  Pulmonary:     Effort: Pulmonary effort is normal. No respiratory distress.     Breath sounds: Normal breath sounds. No rhonchi.  Abdominal:     General: Bowel sounds are normal.     Palpations: Abdomen is soft.  Musculoskeletal:        General: Normal range of motion.     Cervical back: Normal range of motion.  Skin:    General: Skin is warm and dry.  Neurological:     Mental Status: He is alert and oriented to person, place, and time.  Psychiatric:        Thought Content: Thought content does not include homicidal or suicidal ideation.    ED Results / Procedures / Treatments   Labs (all labs ordered are listed, but only abnormal results are displayed) Labs Reviewed - No data to display  EKG None  Radiology No results found.  Procedures Procedures    Medications Ordered in ED Medications  hydrocortisone cream 1 % ( Topical Given 12/24/21 0336)  risperiDONE (RISPERDAL) tablet 1 mg (1 mg Oral Given  12/24/21 0335)    ED Course/ Medical Decision Making/ A&P                           Medical Decision Making Risk Prescription drug management.   36 y.o. M here for refill of his hydrocortisone cream and dose of risperdal.  He is seen almost nightly for same. He appears at baseline without additional complaints.  VSS. He was given medications here.  Can return as needed for new concerns.  Final Clinical Impression(s) / ED Diagnoses Final diagnoses:  Medication management    Rx / DC Orders ED Discharge Orders     None         Garlon Hatchet, PA-C 12/24/21 9924    Geoffery Lyons, MD 12/24/21 [phone]

## 2021-12-24 NOTE — ED Triage Notes (Signed)
Pt states that he needs his cream and his Risperidone.

## 2021-12-25 ENCOUNTER — Emergency Department (HOSPITAL_COMMUNITY)
Admission: EM | Admit: 2021-12-25 | Discharge: 2021-12-25 | Disposition: A | Discharge status: Home / Self Care | Payer: Self-pay | Attending: Emergency Medicine | Admitting: Emergency Medicine

## 2021-12-25 ENCOUNTER — Encounter (HOSPITAL_COMMUNITY): Payer: Self-pay | Admitting: Student

## 2021-12-25 DIAGNOSIS — Z760 Encounter for issue of repeat prescription: Secondary | ICD-10-CM | POA: Insufficient documentation

## 2021-12-25 DIAGNOSIS — F209 Schizophrenia, unspecified: Secondary | ICD-10-CM | POA: Insufficient documentation

## 2021-12-25 DIAGNOSIS — Z7689 Persons encountering health services in other specified circumstances: Secondary | ICD-10-CM

## 2021-12-25 DIAGNOSIS — Z5900 Homelessness unspecified: Secondary | ICD-10-CM | POA: Insufficient documentation

## 2021-12-25 MED ORDER — RISPERIDONE 1 MG PO TABS
1.0000 mg | ORAL_TABLET | Freq: Once | ORAL | Status: AC
  Administered 2021-12-25: 1 mg via ORAL
  Filled 2021-12-25: qty 1

## 2021-12-25 NOTE — ED Triage Notes (Signed)
Pt here for Risperidone. NAD, no additional complaints

## 2021-12-25 NOTE — ED Notes (Signed)
This rn went over paperwork with pt - pt has no questions - pt ambulatory to restroom

## 2021-12-25 NOTE — ED Provider Notes (Signed)
[facility] EMERGENCY DEPARTMENT Provider Note   CSN: [id number] Arrival date & time: 12/25/21  0249     History  Chief Complaint  Patient presents with   Medication Refill    Cole Barrett is a 36 y.o. male with a history of schizophrenia, homelessness, and substance use who presents to the emergency department requesting his Risperdal dose tonight.  He otherwise feels well.  No alleviating or aggravating factors.  He denies SI, HI, or hallucinations at this time.    HPI     Home Medications Prior to Admission medications   Medication Sig Start Date End Date Taking? Authorizing Provider  hydrocortisone cream 1 % Apply 1 application topically 2 (two) times daily as needed (For rash). 12/23/21   Ardis Hughs, NP  hydrOXYzine (ATARAX) 25 MG tablet Take 1 tablet (25 mg total) by mouth 3 (three) times daily as needed for anxiety or itching. 12/23/21   Ardis Hughs, NP  neomycin-bacitracin-polymyxin (NEOSPORIN) ointment Apply topically 2 (two) times daily. 12/23/21   Ardis Hughs, NP  risperiDONE (RISPERDAL) 1 MG tablet Take 1 tablet (1 mg total) by mouth at bedtime. 12/23/21   Ardis Hughs, NP  amantadine (SYMMETREL) 100 MG capsule Take 1 capsule (100 mg total) by mouth 2 (two) times daily. Patient not taking: Reported on 11/04/2019 09/01/19 11/17/19  Cole Abrahams, NP      Allergies    Patient has no known allergies.    Review of Systems   Review of Systems  Constitutional:  Negative for chills and fever.  Respiratory:  Negative for shortness of breath.   Cardiovascular:  Negative for chest pain.  Gastrointestinal:  Negative for abdominal pain and vomiting.  Psychiatric/Behavioral:  Negative for hallucinations and suicidal ideas.   All other systems reviewed and are negative.  Physical Exam Updated Vital Signs BP (!) 133/91    Pulse 80    Temp 98.2 F (36.8 C)    Resp 16    SpO2 100%  Physical Exam Vitals and nursing note  reviewed.  Constitutional:      General: He is not in acute distress.    Appearance: He is well-developed.  HENT:     Head: Normocephalic and atraumatic.  Eyes:     General:        Right eye: No discharge.        Left eye: No discharge.     Conjunctiva/sclera: Conjunctivae normal.  Cardiovascular:     Rate and Rhythm: Normal rate and regular rhythm.  Pulmonary:     Effort: Pulmonary effort is normal.     Breath sounds: Normal breath sounds.  Neurological:     Mental Status: He is alert.     Comments: Clear speech.   Psychiatric:        Behavior: Behavior normal. Behavior is cooperative.        Thought Content: Thought content normal.    ED Results / Procedures / Treatments   Labs (all labs ordered are listed, but only abnormal results are displayed) Labs Reviewed - No data to display  EKG None  Radiology No results found.  Procedures Procedures    Medications Ordered in ED Medications  risperiDONE (RISPERDAL) tablet 1 mg (has no administration in time range)    ED Course/ Medical Decision Making/ A&P                           Medical Decision  Making  Patient presents to the emergency department requesting a dose of his Risperdal tonight.  He is nontoxic, resting comfortably, his vitals are notable for mildly elevated BP- doubt HTN emergency, patient made aware of need for outpatient follow up for recheck.  Chart reviewed for additional history including prior ED visits, patient is well-known to the emergency department.  He has no additional complaints at this time, he was given a dose of his Risperdal, and discharged with resources.I discussed treatment plan, need for follow-up, and return precautions with the patient. Provided opportunity for questions, patient confirmed understanding and is in agreement with plan.         Final Clinical Impression(s) / ED Diagnoses Final diagnoses:  Encounter for medication administration    Rx / DC Orders ED  Discharge Orders     None         Desmond Lope 12/25/21 0508    Tilden Fossa, MD 12/25/21 2251

## 2021-12-25 NOTE — ED Notes (Signed)
Patient verbalizes understanding of discharge instructions. Opportunity for questioning and answers were provided. Armband removed by staff, pt discharged from ED ambulatory.   

## 2021-12-26 ENCOUNTER — Encounter (HOSPITAL_COMMUNITY): Payer: Self-pay | Admitting: Emergency Medicine

## 2021-12-26 ENCOUNTER — Other Ambulatory Visit: Payer: Self-pay

## 2021-12-26 ENCOUNTER — Emergency Department (HOSPITAL_COMMUNITY)
Admission: EM | Admit: 2021-12-26 | Discharge: 2021-12-26 | Disposition: A | Discharge status: Home / Self Care | Payer: Self-pay | Attending: Emergency Medicine | Admitting: Emergency Medicine

## 2021-12-26 DIAGNOSIS — F209 Schizophrenia, unspecified: Secondary | ICD-10-CM | POA: Insufficient documentation

## 2021-12-26 DIAGNOSIS — Z760 Encounter for issue of repeat prescription: Secondary | ICD-10-CM | POA: Insufficient documentation

## 2021-12-26 MED ORDER — RISPERIDONE 1 MG PO TABS
1.0000 mg | ORAL_TABLET | Freq: Once | ORAL | Status: AC
  Administered 2021-12-26: 1 mg via ORAL
  Filled 2021-12-26: qty 1

## 2021-12-26 NOTE — ED Triage Notes (Signed)
Patient requesting risperdal, no complaints

## 2021-12-26 NOTE — ED Provider Notes (Signed)
[facility] Provider Note   CSN: [id number] Arrival date & time: 12/26/21  0257     History  Chief Complaint  Patient presents with   Medication Refill    Cole Barrett is a 36 y.o. male.  Presents to ER stating that he forgot to bring his Risperdal again and is across town with his friend driving him.  He is unable to go back home to get his medication and is concerned that without the medication he will have more psychiatric issues.  Presents to the ER for a dose of his medication.  Otherwise denies any fevers or cough or vomiting or diarrhea.  He states that he does have medication at home but he forgot to bring it with him.      Home Medications Prior to Admission medications   Medication Sig Start Date End Date Taking? Authorizing Provider  hydrocortisone cream 1 % Apply 1 application topically 2 (two) times daily as needed (For rash). 12/23/21   Ardis Hughs, NP  hydrOXYzine (ATARAX) 25 MG tablet Take 1 tablet (25 mg total) by mouth 3 (three) times daily as needed for anxiety or itching. 12/23/21   Ardis Hughs, NP  neomycin-bacitracin-polymyxin (NEOSPORIN) ointment Apply topically 2 (two) times daily. 12/23/21   Ardis Hughs, NP  risperiDONE (RISPERDAL) 1 MG tablet Take 1 tablet (1 mg total) by mouth at bedtime. 12/23/21   Ardis Hughs, NP  amantadine (SYMMETREL) 100 MG capsule Take 1 capsule (100 mg total) by mouth 2 (two) times daily. Patient not taking: Reported on 11/04/2019 09/01/19 11/17/19  Cole Abrahams, NP      Allergies    Patient has no known allergies.    Review of Systems   Review of Systems  Constitutional:  Negative for fever.  HENT:  Negative for ear pain and sore throat.   Eyes:  Negative for pain.  Respiratory:  Negative for cough.   Cardiovascular:  Negative for chest pain.  Gastrointestinal:  Negative for abdominal pain.  Genitourinary:  Negative for flank pain.  Musculoskeletal:   Negative for back pain.  Skin:  Negative for color change and rash.  Neurological:  Negative for syncope.  All other systems reviewed and are negative.  Physical Exam Updated Vital Signs BP (!) 175/99 (BP Location: Right Arm)    Pulse 81    Temp 97.8 F (36.6 C) (Oral)    Resp 16    SpO2 95%  Physical Exam Constitutional:      Appearance: He is well-developed.  HENT:     Head: Normocephalic.     Nose: Nose normal.  Eyes:     Extraocular Movements: Extraocular movements intact.  Cardiovascular:     Rate and Rhythm: Normal rate.  Pulmonary:     Effort: Pulmonary effort is normal.  Skin:    Coloration: Skin is not jaundiced.  Neurological:     Mental Status: He is alert. Mental status is at baseline.    ED Results / Procedures / Treatments   Labs (all labs ordered are listed, but only abnormal results are displayed) Labs Reviewed - No data to display  EKG None  Radiology No results found.  Procedures Procedures    Medications Ordered in ED Medications  risperiDONE (RISPERDAL) tablet 1 mg (has no administration in time range)    ED Course/ Medical Decision Making/ A&P  Medical Decision Making Risk Prescription drug management.   I discussed with patient his continued use of the ER for his single dose of Risperdal medication.  I advised him that he will need to carry his medication with him to try to avoid further use of the ER for medication dosing.  Patient expressed understanding.  Discharged home stable condition.        Final Clinical Impression(s) / ED Diagnoses Final diagnoses:  Schizophrenia, unspecified type [facility])    Rx / DC Orders ED Discharge Orders     None         Cole Cockayne, MD 12/26/21 [phone]

## 2021-12-27 ENCOUNTER — Encounter (HOSPITAL_COMMUNITY): Payer: Self-pay

## 2021-12-27 ENCOUNTER — Other Ambulatory Visit: Payer: Self-pay

## 2021-12-27 ENCOUNTER — Emergency Department (HOSPITAL_COMMUNITY)
Admission: EM | Admit: 2021-12-27 | Discharge: 2021-12-27 | Disposition: A | Discharge status: Home / Self Care | Payer: Self-pay | Attending: Emergency Medicine | Admitting: Emergency Medicine

## 2021-12-27 DIAGNOSIS — Z760 Encounter for issue of repeat prescription: Secondary | ICD-10-CM | POA: Insufficient documentation

## 2021-12-27 DIAGNOSIS — F209 Schizophrenia, unspecified: Secondary | ICD-10-CM | POA: Insufficient documentation

## 2021-12-27 DIAGNOSIS — Z5900 Homelessness unspecified: Secondary | ICD-10-CM | POA: Insufficient documentation

## 2021-12-27 MED ORDER — BACITRACIN ZINC 500 UNIT/GM EX OINT
TOPICAL_OINTMENT | Freq: Two times a day (BID) | CUTANEOUS | Status: DC
  Filled 2021-12-27: qty 0.9

## 2021-12-27 NOTE — Discharge Instructions (Signed)
Apply bacitracin to affected areas twice per day. Follow-up with primary care within 3 days.  Return to the emergency department for new or worsening symptoms including but not limited to new or worsening rash, fever, trouble breathing, trouble swallowing, or any other concerns.

## 2021-12-27 NOTE — ED Triage Notes (Signed)
Pt presents to the ED wanting a refill of hydrocortisone creme that he is using on the rash to his left flank. Pt states it is getting better.

## 2021-12-27 NOTE — ED Provider Notes (Signed)
[name] EMERGENCY DEPARTMENT Provider Note   CSN: [id number] Arrival date & time: 12/27/21  0243     History  Chief Complaint  Patient presents with   Medication Refill    Cole Barrett is a 36 y.o. male with a history of schizophrenia, homelessness, and substance use who presents to the emergency department with complaints of needing a refill of his cream.  He states that he has a rash that has been present for an extended period of time, months.  No alleviating or aggravating factors.  He denies fever, chest pain, shortness of breath or trouble.  HPI     Home Medications Prior to Admission medications   Medication Sig Start Date End Date Taking? Authorizing Provider  hydrocortisone cream 1 % Apply 1 application topically 2 (two) times daily as needed (For rash). 12/23/21   Ardis Hughs, NP  hydrOXYzine (ATARAX) 25 MG tablet Take 1 tablet (25 mg total) by mouth 3 (three) times daily as needed for anxiety or itching. 12/23/21   Ardis Hughs, NP  neomycin-bacitracin-polymyxin (NEOSPORIN) ointment Apply topically 2 (two) times daily. 12/23/21   Ardis Hughs, NP  risperiDONE (RISPERDAL) 1 MG tablet Take 1 tablet (1 mg total) by mouth at bedtime. 12/23/21   Ardis Hughs, NP  amantadine (SYMMETREL) 100 MG capsule Take 1 capsule (100 mg total) by mouth 2 (two) times daily. Patient not taking: Reported on 11/04/2019 09/01/19 11/17/19  [name], NP      Allergies    Patient has no known allergies.    Review of Systems   Review of Systems  Constitutional:  Negative for chills and fever.  HENT:  Negative for trouble swallowing.   Respiratory:  Negative for cough and shortness of breath.   Gastrointestinal:  Negative for abdominal pain and vomiting.  Skin:  Positive for rash.  All other systems reviewed and are negative.  Physical Exam Updated Vital Signs BP 133/81    Pulse 93    Temp 98 F (36.7 C) (Oral)    Resp 16    Ht  5\' 10"  (1.778 m)    Wt 99 kg    SpO2 96%    BMI 31.32 kg/m  Physical Exam Vitals and nursing note reviewed.  Constitutional:      General: He is not in acute distress.    Appearance: He is well-developed.  HENT:     Head: Normocephalic and atraumatic.     Mouth/Throat:     Comments: No angioedema.  Airway is patent. Eyes:     General:        Right eye: No discharge.        Left eye: No discharge.     Conjunctiva/sclera: Conjunctivae normal.  Cardiovascular:     Rate and Rhythm: Normal rate and regular rhythm.  Pulmonary:     Effort: Pulmonary effort is normal.     Breath sounds: Normal breath sounds.  Skin:    Findings: Rash is not purpuric, pustular, scaling, urticarial or vesicular.     Comments: Patient has multiple scattered scabbed areas some open - variable stages of healing, all less than 1 cm in diameter to the upper arms with a couple to the lower back.  There is no surrounding erythema, purulent drainage, induration, or fluctuance.  There is no palm involvement.  No mucous membrane involvement.  No urticaria.  No vesicles.  Neurological:     Mental Status: He is alert.  Comments: Clear speech.   Psychiatric:        Behavior: Behavior normal.        Thought Content: Thought content normal.    ED Results / Procedures / Treatments   Labs (all labs ordered are listed, but only abnormal results are displayed) Labs Reviewed - No data to display  EKG None  Radiology No results found.  Procedures Procedures    Medications Ordered in ED Medications  bacitracin ointment (has no administration in time range)    ED Course/ Medical Decision Making/ A&P                           Medical Decision Making Risk OTC drugs.  Patient well-known to the emergency department presents today requesting a refill of cream he has previously been prescribed.  He is requesting this in reference to a "rash", on assessment he has multiple small scabs/open wounds to the upper  arms and to the lower back in variable stages of healing- appears to have had similar in chart review.  There is no significant erythema, warmth, or purulent drainage.  No obvious abscess.  No palm involvement.  Airways patent.  No angioedema.  Overall low suspicion for for SJS, TEN, TSS, tick borne illness, syphilis or other life-threatening condition at this time.  We will provide bacitracin ointment for him to go home with.  He overall appears appropriate for discharge at this time.         Final Clinical Impression(s) / ED Diagnoses Final diagnoses:  Medication refill    Rx / DC Orders ED Discharge Orders     None         Cherly Anderson, PA-C 12/27/21 0445    Mesner, Barbara Cower, MD 12/27/21 [phone]

## 2021-12-28 ENCOUNTER — Emergency Department (HOSPITAL_COMMUNITY)
Admission: EM | Admit: 2021-12-28 | Discharge: 2021-12-28 | Disposition: A | Discharge status: Home / Self Care | Payer: Self-pay | Attending: Emergency Medicine | Admitting: Emergency Medicine

## 2021-12-28 ENCOUNTER — Other Ambulatory Visit: Payer: Self-pay

## 2021-12-28 DIAGNOSIS — Z5900 Homelessness unspecified: Secondary | ICD-10-CM | POA: Insufficient documentation

## 2021-12-28 DIAGNOSIS — Z760 Encounter for issue of repeat prescription: Secondary | ICD-10-CM | POA: Insufficient documentation

## 2021-12-28 DIAGNOSIS — R21 Rash and other nonspecific skin eruption: Secondary | ICD-10-CM | POA: Insufficient documentation

## 2021-12-28 MED ORDER — BACITRACIN ZINC 500 UNIT/GM EX OINT
TOPICAL_OINTMENT | Freq: Once | CUTANEOUS | Status: AC
  Filled 2021-12-28: qty 4.5

## 2021-12-28 NOTE — ED Triage Notes (Signed)
Pt request refill for the cream for his rash.

## 2021-12-28 NOTE — Discharge Instructions (Addendum)
Apply bacitracin to affected areas twice per day. Follow-up with primary care within 2-3 days. Return to the emergency department for new or worsening rash, fever, trouble breathing, trouble swallowing.

## 2021-12-28 NOTE — ED Provider Notes (Signed)
[facility]-EMERGENCY DEPT Provider Note   CSN: [id number] Arrival date & time: 12/28/21  0351     History  Chief Complaint  Patient presents with   Medication Refill    Cole Barrett is a 36 y.o. male with a past medical history of schizophrenia, homelessness, substance abuse who presents to the ED with concerns for medication refill onset today.  He notes that he was prescribed a cream that helped with a rash that he has had for an extended amount of time, several months.  No alleviating or exacerbating factors.  Denies fever, chest pain, shortness of breath, color change.   Per patient chart review: Patient was evaluated in the emergency department on 12/27/2021.  At that time patient was given bacitracin.  The history is provided by the patient. No language interpreter was used.      Home Medications Prior to Admission medications   Medication Sig Start Date End Date Taking? Authorizing Provider  hydrocortisone cream 1 % Apply 1 application topically 2 (two) times daily as needed (For rash). 12/23/21   Ardis Hughs, NP  hydrOXYzine (ATARAX) 25 MG tablet Take 1 tablet (25 mg total) by mouth 3 (three) times daily as needed for anxiety or itching. 12/23/21   Ardis Hughs, NP  neomycin-bacitracin-polymyxin (NEOSPORIN) ointment Apply topically 2 (two) times daily. 12/23/21   Ardis Hughs, NP  risperiDONE (RISPERDAL) 1 MG tablet Take 1 tablet (1 mg total) by mouth at bedtime. 12/23/21   Ardis Hughs, NP  amantadine (SYMMETREL) 100 MG capsule Take 1 capsule (100 mg total) by mouth 2 (two) times daily. Patient not taking: Reported on 11/04/2019 09/01/19 11/17/19  [name], NP      Allergies    Patient has no known allergies.    Review of Systems   Review of Systems  Constitutional:  Negative for chills and fever.  Respiratory:  Negative for shortness of breath.   Cardiovascular:  Negative for chest pain.  Skin:  Positive for  rash. Negative for color change.  All other systems reviewed and are negative.  Physical Exam Updated Vital Signs BP (!) 173/67 (BP Location: Right Arm)    Pulse 70    Temp (!) 97.1 F (36.2 C)    Resp 18    SpO2 98%  Physical Exam Vitals and nursing note reviewed.  Constitutional:      General: He is not in acute distress.    Appearance: He is not diaphoretic.  HENT:     Head: Normocephalic and atraumatic.     Mouth/Throat:     Mouth: Mucous membranes are moist.     Pharynx: Oropharynx is clear. No oropharyngeal exudate.  Eyes:     General: No scleral icterus.    Conjunctiva/sclera: Conjunctivae normal.  Cardiovascular:     Rate and Rhythm: Normal rate and regular rhythm.     Pulses: Normal pulses.     Heart sounds: Normal heart sounds.  Pulmonary:     Effort: Pulmonary effort is normal. No respiratory distress.     Breath sounds: Normal breath sounds. No wheezing.  Abdominal:     Palpations: Abdomen is soft.  Musculoskeletal:        General: Normal range of motion.     Cervical back: Normal range of motion and neck supple.  Skin:    General: Skin is warm and dry.     Comments: Multiple scattered scabbed areas in various stages of healing noted to right lateral  thigh and bilateral upper extremities.  No surrounding erythema, drainage, induration, fluctuance.  No palm involvement.  No mucosal involvement.  No urticaria. No vesicles.    Neurological:     Mental Status: He is alert.  Psychiatric:        Behavior: Behavior normal.    ED Results / Procedures / Treatments   Labs (all labs ordered are listed, but only abnormal results are displayed) Labs Reviewed - No data to display  EKG None  Radiology No results found.  Procedures Procedures    Medications Ordered in ED Medications  bacitracin ointment ( Topical Given 12/28/21 0743)    ED Course/ Medical Decision Making/ A&P Clinical Course as of 12/28/21 1358  Tue Dec 28, 2021  0740 Patient provided with  bacitracin ointment in the ED prior to discharge.  Patient noted to be ambulating to the bathroom without assistance or difficulty.  Patient appears safe for discharge at this time. [SB]    Clinical Course User Index [SB] Austin Pongratz A, PA-C                           Medical Decision Making Risk OTC drugs.   Pt presents for concerns for medication refill. Patient with multiple visits to the emergency department for similar concerns.  Previous visits noted that he is asking for refill of a cream that he was previously prescribed for a rash.  Upon further chart review, cream that patient was given was hydrocortisone.  Vital signs stable, patient afebrile, not tachycardic or hypoxic.  On exam patient with multiple small scabs/open wounds in various stages of healing noted to lateral upper arms, right lateral thigh.  From review of patient chart review, patient with similar concerns and findings on exam.  No erythema, increased warmth, purulent drainage, abscess, induration.  No palm involvement.  Airway patent.  No concerns for airway compromise at this time.  No angioedema.  Doubt SJS, TENS, syphilis, tickborne illness at this time.   Patient provided bacitracin ointment to go home with.  Discussed with patient to follow-up with primary care provider in 2-3 days for reevaluation of rash.  Patient appears safe for discharge at this time.  Follow-up as indicated discharge for work.  Final Clinical Impression(s) / ED Diagnoses Final diagnoses:  Medication refill    Rx / DC Orders ED Discharge Orders     None         Alvia Tory A, PA-C 12/28/21 1359    Derwood Kaplan, MD 12/29/21 [phone]

## 2021-12-29 ENCOUNTER — Emergency Department (HOSPITAL_COMMUNITY)
Admission: EM | Admit: 2021-12-29 | Discharge: 2021-12-29 | Disposition: A | Discharge status: Home / Self Care | Payer: Self-pay | Attending: Emergency Medicine | Admitting: Emergency Medicine

## 2021-12-29 ENCOUNTER — Encounter (HOSPITAL_COMMUNITY): Payer: Self-pay

## 2021-12-29 DIAGNOSIS — Z760 Encounter for issue of repeat prescription: Secondary | ICD-10-CM | POA: Insufficient documentation

## 2021-12-29 MED ORDER — HYDROCORTISONE 1 % EX CREA: 1.0000 "application " | TOPICAL_CREAM | Freq: Two times a day (BID) | CUTANEOUS | 0 refills | Status: DC | PRN

## 2021-12-29 NOTE — Discharge Instructions (Signed)
Medicine Refill at the Emergency Department We have refilled your medicine today. However, it is best for you to get refills through your primary health care provider's office. In the future, please plan ahead so you do not need to get refills from the emergency department. If we refilled a medicine that you take daily for a chronic problem (maintenance medicine), you may have received only enough to get you by until you are able to see your regular health care provider. This information is not intended to replace advice given to you by your health care provider. Make sure you discuss any questions you have with your health care provider. 

## 2021-12-29 NOTE — ED Provider Notes (Signed)
[facility]-EMERGENCY DEPT Provider Note   CSN: [id number] Arrival date & time: 12/29/21  0305     History  Chief Complaint  Patient presents with   Medication Refill    Cole Barrett is a 36 y.o. male who presents emergency department with request to refill his hydrocortisone cream.  He has no other complaints at this time.   Medication Refill     Home Medications Prior to Admission medications   Medication Sig Start Date End Date Taking? Authorizing Provider  hydrocortisone cream 1 % Apply 1 application topically 2 (two) times daily as needed (For rash). 12/23/21   Ardis Hughs, NP  hydrOXYzine (ATARAX) 25 MG tablet Take 1 tablet (25 mg total) by mouth 3 (three) times daily as needed for anxiety or itching. 12/23/21   Ardis Hughs, NP  neomycin-bacitracin-polymyxin (NEOSPORIN) ointment Apply topically 2 (two) times daily. 12/23/21   Ardis Hughs, NP  risperiDONE (RISPERDAL) 1 MG tablet Take 1 tablet (1 mg total) by mouth at bedtime. 12/23/21   Ardis Hughs, NP  amantadine (SYMMETREL) 100 MG capsule Take 1 capsule (100 mg total) by mouth 2 (two) times daily. Patient not taking: Reported on 11/04/2019 09/01/19 11/17/19  [name], NP      Allergies    Patient has no known allergies.    Review of Systems   Review of Systems  Physical Exam Updated Vital Signs BP 134/74 (BP Location: Left Arm)    Pulse 67    Temp 98.8 F (37.1 C) (Oral)    Resp 16    Ht 5\' 10"  (1.778 m)    Wt 98.9 kg    SpO2 100%    BMI 31.28 kg/m  Physical Exam Vitals and nursing note reviewed.  Constitutional:      General: He is not in acute distress.    Appearance: He is well-developed. He is not diaphoretic.  HENT:     Head: Normocephalic and atraumatic.  Eyes:     General: No scleral icterus.    Conjunctiva/sclera: Conjunctivae normal.  Cardiovascular:     Rate and Rhythm: Normal rate and regular rhythm.     Heart sounds: Normal heart  sounds.  Pulmonary:     Effort: Pulmonary effort is normal. No respiratory distress.     Breath sounds: Normal breath sounds.  Abdominal:     Palpations: Abdomen is soft.     Tenderness: There is no abdominal tenderness.  Musculoskeletal:     Cervical back: Normal range of motion and neck supple.  Skin:    General: Skin is warm and dry.  Neurological:     Mental Status: He is alert.  Psychiatric:        Behavior: Behavior normal.    ED Results / Procedures / Treatments   Labs (all labs ordered are listed, but only abnormal results are displayed) Labs Reviewed - No data to display  EKG None  Radiology No results found.  Procedures Procedures    Medications Ordered in ED Medications - No data to display  ED Course/ Medical Decision Making/ A&P                           Medical Decision Making  Patient here for medication refill no other complaints.  He is well-known to this emergency department with history of schizoaffective disorder, alcohol abuse and multiple emergency department visits.  Prescription for hydrocortisone provided.  He was appropriate  for discharge at this time Final Clinical Impression(s) / ED Diagnoses Final diagnoses:  Medication refill    Rx / DC Orders ED Discharge Orders     None         Arthor Captain, PA-C 12/29/21 0401    Sabas Sous, MD 12/29/21 [phone]

## 2021-12-29 NOTE — ED Triage Notes (Signed)
Patient arrives to ED, states he needs refill for cream for his rash.

## 2021-12-30 ENCOUNTER — Emergency Department (HOSPITAL_COMMUNITY)
Admission: EM | Admit: 2021-12-30 | Discharge: 2021-12-30 | Disposition: A | Discharge status: Home / Self Care | Payer: Self-pay | Attending: Emergency Medicine | Admitting: Emergency Medicine

## 2021-12-30 DIAGNOSIS — Z5900 Homelessness unspecified: Secondary | ICD-10-CM | POA: Insufficient documentation

## 2021-12-30 DIAGNOSIS — X58XXXA Exposure to other specified factors, initial encounter: Secondary | ICD-10-CM | POA: Insufficient documentation

## 2021-12-30 DIAGNOSIS — Z760 Encounter for issue of repeat prescription: Secondary | ICD-10-CM | POA: Insufficient documentation

## 2021-12-30 DIAGNOSIS — F209 Schizophrenia, unspecified: Secondary | ICD-10-CM | POA: Insufficient documentation

## 2021-12-30 DIAGNOSIS — S71101A Unspecified open wound, right thigh, initial encounter: Secondary | ICD-10-CM | POA: Insufficient documentation

## 2021-12-30 MED ORDER — BACITRACIN-NEOMYCIN-POLYMYXIN OINTMENT TUBE
TOPICAL_OINTMENT | Freq: Once | CUTANEOUS | Status: AC
  Filled 2021-12-30: qty 14

## 2021-12-30 MED ORDER — RISPERIDONE 1 MG PO TABS
1.0000 mg | ORAL_TABLET | Freq: Once | ORAL | Status: AC
  Administered 2021-12-30: 1 mg via ORAL
  Filled 2021-12-30: qty 1

## 2021-12-30 NOTE — ED Provider Notes (Signed)
[facility] EMERGENCY DEPARTMENT Provider Note   CSN: [id number] Arrival date & time: 12/30/21  0457     History  Chief Complaint  Patient presents with   Medication Refill    Cole Barrett is a 36 y.o. male with a past medical history of schizophrenia, homelessness.  Presents to the emergency department with a chief complaint of medication refill.  Patient is requesting his Risperdal medication.  Patient denies any SI, HI, AVH.  Patient states that he was unable to take his medications today.   Patient also is requesting a cream for the "rash," to his right leg.  Patient states that the rash has been present for the beginning of January.  Patient endorses pruritus with his rash.  Patient denies any fevers, chills.     Medication Refill     Home Medications Prior to Admission medications   Medication Sig Start Date End Date Taking? Authorizing Provider  hydrocortisone cream 1 % Apply 1 application topically 2 (two) times daily as needed (For rash). 12/29/21   Arthor Captain, PA-C  hydrOXYzine (ATARAX) 25 MG tablet Take 1 tablet (25 mg total) by mouth 3 (three) times daily as needed for anxiety or itching. 12/23/21   Ardis Hughs, NP  neomycin-bacitracin-polymyxin (NEOSPORIN) ointment Apply topically 2 (two) times daily. 12/23/21   Ardis Hughs, NP  risperiDONE (RISPERDAL) 1 MG tablet Take 1 tablet (1 mg total) by mouth at bedtime. 12/23/21   Ardis Hughs, NP  amantadine (SYMMETREL) 100 MG capsule Take 1 capsule (100 mg total) by mouth 2 (two) times daily. Patient not taking: Reported on 11/04/2019 09/01/19 11/17/19  [name], NP      Allergies    Patient has no known allergies.    Review of Systems   Review of Systems  Constitutional:  Negative for chills and fever.  Gastrointestinal:  Negative for nausea and vomiting.  Skin:  Positive for rash and wound. Negative for color change and pallor.  Psychiatric/Behavioral:  Negative  for agitation, hallucinations and suicidal ideas.    Physical Exam Updated Vital Signs BP 140/89 (BP Location: Right Arm)    Pulse 75    Temp 97.8 F (36.6 C) (Oral)    Resp 18    SpO2 98%  Physical Exam Vitals and nursing note reviewed.  Constitutional:      General: He is not in acute distress.    Appearance: He is not ill-appearing, toxic-appearing or diaphoretic.  HENT:     Head: Normocephalic.  Eyes:     General: No scleral icterus.       Right eye: No discharge.        Left eye: No discharge.  Cardiovascular:     Rate and Rhythm: Normal rate.  Pulmonary:     Effort: Pulmonary effort is normal.  Skin:    General: Skin is warm and dry.     Findings: Wound present. No petechiae or rash. Rash is not crusting, macular, nodular, papular, purpuric, pustular, scaling, urticarial or vesicular.     Comments: Multiple wounds to right thigh in various stages of healing.  No surrounding purulence, erythema, induration.  No rash noted to palms bilaterally or mucous membranes.    Neurological:     General: No focal deficit present.     Mental Status: He is alert.  Psychiatric:        Behavior: Behavior is cooperative.      ED Results / Procedures / Treatments   Labs (  all labs ordered are listed, but only abnormal results are displayed) Labs Reviewed - No data to display  EKG None  Radiology No results found.  Procedures Procedures    Medications Ordered in ED Medications  risperiDONE (RISPERDAL) tablet 1 mg (has no administration in time range)  neomycin-bacitracin-polymyxin (NEOSPORIN) ointment packet (has no administration in time range)    ED Course/ Medical Decision Making/ A&P                           Medical Decision Making Risk OTC drugs. Prescription drug management.   Alert 36 year old male in no acute distress, nontoxic appearing.   Presents to the emergency department with a chief complaint of medication refill.   Patient has past medical history  of homelessness and schizophrenia which complicate his care.  Information was obtained from patient.  Previous medical records were reviewed.    Patient has been seen multiple times in the emergency department for similar complaints.  Patient was recently prescribed hydrocortisone cream yesterday.  On 1/26 patient was seen at [facility]; he was prescribed Atarax, 30-day supply of risperidone, and bacitracin.  Patient denies any SI, HI, AVH.  Patient is not acutely psychotic at this time.  We will give patient his Resporal and advised him to follow-up in outpatient setting for continued management of this medication.  Wounds to right thigh as pictured above.  Suspect wounds are due to patient's frequent scratching.  No signs of infection at this time.  Patient had wound dressed by RN  Discussed results, findings, treatment and follow up. Patient advised of return precautions. Patient verbalized understanding and agreed with plan.          Final Clinical Impression(s) / ED Diagnoses Final diagnoses:  Medication refill  Open wound of right thigh, initial encounter    Rx / DC Orders ED Discharge Orders     None         Haskel Schroeder, PA-C 12/30/21 1610    Sabas Sous, MD 12/30/21 [phone]

## 2021-12-30 NOTE — ED Triage Notes (Signed)
Patient arrives to ED requesting his Risperdal, and cream for rash. No other complaints at this time.

## 2021-12-30 NOTE — Discharge Instructions (Addendum)
You came to the emergency department today for medication refill.  You received your Risperdal medication.  Please follow-up with the [facility] for further management of your Risperdal medication.  Get help right away if: You have a red streak of skin near the area around your wound. Pus or a bad smell coming from the wound. Your wound has been closed with staples, sutures, skin glue, or adhesive strips and it begins to open up and separate. Your wound is bleeding, and the bleeding does not stop with gentle pressure. Suicidal or homicidal thoughts

## 2021-12-31 ENCOUNTER — Encounter (HOSPITAL_COMMUNITY): Payer: Self-pay

## 2021-12-31 ENCOUNTER — Other Ambulatory Visit: Payer: Self-pay

## 2021-12-31 ENCOUNTER — Emergency Department (HOSPITAL_COMMUNITY)
Admission: EM | Admit: 2021-12-31 | Discharge: 2021-12-31 | Disposition: A | Discharge status: Home / Self Care | Payer: Self-pay | Attending: Emergency Medicine | Admitting: Emergency Medicine

## 2021-12-31 DIAGNOSIS — Z760 Encounter for issue of repeat prescription: Secondary | ICD-10-CM | POA: Insufficient documentation

## 2021-12-31 DIAGNOSIS — F17210 Nicotine dependence, cigarettes, uncomplicated: Secondary | ICD-10-CM | POA: Insufficient documentation

## 2021-12-31 DIAGNOSIS — Z8616 Personal history of COVID-19: Secondary | ICD-10-CM | POA: Insufficient documentation

## 2021-12-31 MED ORDER — RISPERIDONE 2 MG PO TABS
3.0000 mg | ORAL_TABLET | Freq: Once | ORAL | Status: AC
  Administered 2021-12-31: 3 mg via ORAL
  Filled 2021-12-31: qty 1

## 2021-12-31 MED ORDER — HYDROCORTISONE 1 % EX CREA: 1.0000 "application " | TOPICAL_CREAM | Freq: Two times a day (BID) | CUTANEOUS | 0 refills | Status: DC | PRN

## 2021-12-31 NOTE — ED Triage Notes (Signed)
Patient arrives to ED requesting dose of Risperdal and cream for rash. Denies any other complaints.

## 2021-12-31 NOTE — ED Provider Notes (Signed)
[facility]-EMERGENCY DEPT [facility] Emergency Department Provider Note MRN:  [id number]  Arrival date & time: 12/31/21     Chief Complaint   Medication Refill   History of Present Illness   Cole Barrett is a 36 y.o. year-old male with a history of schizophrenia presenting to the ED with chief complaint of medication refill.  Requesting dose of Risperdal, requesting cream for rash.  Multiple ED visits for same.  Denies any other complaints.  Review of Systems  A thorough review of systems was obtained and all systems are negative except as noted in the HPI and PMH.   Patient's Health History    Past Medical History:  Diagnosis Date   COVID-19    Homelessness    Hypercholesterolemia    Mental disorder    Paranoid behavior (HCC)    PE (pulmonary embolism)    Schizophrenia (HCC)     History reviewed. No pertinent surgical history.  Family History  Problem Relation Age of Onset   Hypertension Mother     Social History   Socioeconomic History   Marital status: Single    Spouse name: Not on file   Number of children: Not on file   Years of education: Not on file   Highest education level: Not on file  Occupational History   Occupation: Employed    Comment: [employer]  Tobacco Use   Smoking status: Every Day    Packs/day: 0.50    Types: Cigarettes   Smokeless tobacco: Never  Vaping Use   Vaping Use: Never used  Substance and Sexual Activity   Alcohol use: Yes    Comment: BAC not available   Drug use: Yes    Types: Marijuana    Comment: Last used: 2 months ago UDS NA   Sexual activity: Never  Other Topics Concern   Not on file  Social History Narrative   ** Merged History Encounter **    Pt lives alone in [place].  He is employed.  He stated that he is followed by [facility], but that he has not been there in a while.   Social Determinants of Health   Financial Resource Strain: Not on file  Food Insecurity: Not on file  Transportation  Needs: Not on file  Physical Activity: Not on file  Stress: Not on file  Social Connections: Not on file  Intimate Partner Violence: Not on file     Physical Exam   Vitals:   12/31/21 0349  BP: 131/88  Pulse: 95  Resp: 18  Temp: 98.2 F (36.8 C)  SpO2: 100%    CONSTITUTIONAL: Well-appearing, NAD NEURO/PSYCH:  oriented x 3, no focal deficits, somnolent but wakes to voice EYES:  eyes equal and reactive ENT/NECK:  no LAD, no JVD CARDIO: Regular rate, well-perfused, normal S1 and S2 PULM:  CTAB no wheezing or rhonchi GI/GU:  non-distended, non-tender MSK/SPINE:  No gross deformities, no edema SKIN:  no rash, atraumatic   *Additional and/or pertinent findings included in MDM below  Diagnostic and Interventional Summary    EKG Interpretation  Date/Time:    Ventricular Rate:    PR Interval:    QRS Duration:   QT Interval:    QTC Calculation:   R Axis:     Text Interpretation:         Labs Reviewed - No data to display  No orders to display    Medications  risperiDONE (RISPERDAL) tablet 3 mg (has no administration in time range)  Procedures  /  Critical Care Procedures  ED Course and Medical Decision Making  Initial Impression and Ddx Frequent ED visitor, patient is refusing subcu or intramuscular Risperdal, which would eliminate patient's need for nightly ED visits.  Will provide oral pill.  Past medical/surgical history that increases complexity of ED encounter: None  Interpretation of Diagnostics Not applicable  Patient Reassessment and Ultimate Disposition/Management Discharge.  Patient management required discussion with the following services or consulting groups:  None  Complexity of Problems Addressed Acute uncomplicated illness or injury with no diagnostics  Additional Data Reviewed and Analyzed Further history obtained from: None  Factors Impacting ED Encounter Risk SDOH impact on management  Elmer Sow. Pilar Plate, MD [facility]  Emergency Medicine [facility] [email or web address]  Final Clinical Impressions(s) / ED Diagnoses     ICD-10-CM   1. Medication refill  Z76.0       ED Discharge Orders          Ordered    hydrocortisone cream 1 %  2 times daily PRN        12/31/21 0406             Discharge Instructions Discussed with and Provided to Patient:    Discharge Instructions      You were evaluated in the Emergency Department and after careful evaluation, we did not find any emergent condition requiring admission or further testing in the hospital.  Your exam/testing today was overall reassuring.  Please return to the Emergency Department if you experience any worsening of your condition.  Thank you for allowing [place] to be a part of your care.       Sabas Sous, MD 12/31/21 [phone]

## 2021-12-31 NOTE — Discharge Instructions (Signed)
You were evaluated in the Emergency Department and after careful evaluation, we did not find any emergent condition requiring admission or further testing in the hospital.  Your exam/testing today was overall reassuring.  Please return to the Emergency Department if you experience any worsening of your condition.  Thank you for allowing us to be a part of your care.  

## 2022-01-02 ENCOUNTER — Emergency Department (HOSPITAL_COMMUNITY)
Admission: EM | Admit: 2022-01-02 | Discharge: 2022-01-02 | Disposition: A | Discharge status: Home / Self Care | Payer: Self-pay | Attending: Emergency Medicine | Admitting: Emergency Medicine

## 2022-01-02 ENCOUNTER — Other Ambulatory Visit: Payer: Self-pay

## 2022-01-02 DIAGNOSIS — R21 Rash and other nonspecific skin eruption: Secondary | ICD-10-CM | POA: Insufficient documentation

## 2022-01-02 DIAGNOSIS — Z760 Encounter for issue of repeat prescription: Secondary | ICD-10-CM | POA: Insufficient documentation

## 2022-01-02 NOTE — ED Triage Notes (Signed)
Pt here asking for refill of hydrocortisone cream for a rash on his back and his dose of Risperdal. Pt says he thinks his rash is almost gone but still needs some more cream

## 2022-01-02 NOTE — ED Provider Notes (Signed)
[facility] EMERGENCY DEPARTMENT Provider Note   CSN: [id number] Arrival date & time: 01/02/22  0147     History  Chief Complaint  Patient presents with   Medication Refill    Cole Barrett is a 36 y.o. male.  HPI  36 year old male here requesting a refill of his hydrocortisone cream.  He reports he has a rash to his right thigh area.  He has had several presentations this week for similar complaints and has already had his prescription refilled within the last 2 days.   Home Medications Prior to Admission medications   Medication Sig Start Date End Date Taking? Authorizing Provider  hydrocortisone cream 1 % Apply 1 application topically 2 (two) times daily as needed (For rash). 12/31/21   Sabas Sous, MD  hydrOXYzine (ATARAX) 25 MG tablet Take 1 tablet (25 mg total) by mouth 3 (three) times daily as needed for anxiety or itching. 12/23/21   Ardis Hughs, NP  neomycin-bacitracin-polymyxin (NEOSPORIN) ointment Apply topically 2 (two) times daily. 12/23/21   Ardis Hughs, NP  risperiDONE (RISPERDAL) 1 MG tablet Take 1 tablet (1 mg total) by mouth at bedtime. 12/23/21   Ardis Hughs, NP  amantadine (SYMMETREL) 100 MG capsule Take 1 capsule (100 mg total) by mouth 2 (two) times daily. Patient not taking: Reported on 11/04/2019 09/01/19 11/17/19  [name], NP      Allergies    Patient has no known allergies.    Review of Systems   Review of Systems See HPI for pertinent positives or negatives.   Physical Exam Updated Vital Signs BP 119/69 (BP Location: Right Arm)    Pulse 80    Temp 98 F (36.7 C) (Oral)    Resp 19    SpO2 96%  Physical Exam Constitutional:      General: He is not in acute distress.    Appearance: He is well-developed.  Eyes:     Conjunctiva/sclera: Conjunctivae normal.  Cardiovascular:     Rate and Rhythm: Normal rate and regular rhythm.  Pulmonary:     Effort: Pulmonary effort is normal.     Breath  sounds: Normal breath sounds.  Abdominal:     Palpations: Abdomen is soft.     Tenderness: There is no abdominal tenderness.  Skin:    General: Skin is warm and dry.     Comments: Excoriations noted to the right lateral thigh.  No surrounding erythema warmth or tenderness noted.  No drainage noted.  Neurological:     Mental Status: He is alert and oriented to person, place, and time.     ED Results / Procedures / Treatments   Labs (all labs ordered are listed, but only abnormal results are displayed) Labs Reviewed - No data to display  EKG None  Radiology No results found.  Procedures Procedures    Medications Ordered in ED Medications - No data to display  ED Course/ Medical Decision Making/ A&P                           Medical Decision Making  36 y/o male presents for med refill. He is asking for a refill of his hydrocortisone cream.  He has several ED presentations for similar symptoms.  He just received a refill of this prescription 2 days ago.  His rash does not like any worse than prior pictures and does not look infected.  I do not think he  requires any antibiotics.  Feel he is appropriate for discharge home. Final Clinical Impression(s) / ED Diagnoses Final diagnoses:  Medication refill    Rx / DC Orders ED Discharge Orders     None         Rayne Du 01/02/22 0405    Palumbo, April, MD 01/02/22 [phone]

## 2022-01-02 NOTE — Discharge Instructions (Signed)
Please follow up with your primary care provider within 5-7 days for re-evaluation of your symptoms. If you do not have a primary care provider, information for a healthcare clinic has been provided for you to make arrangements for follow up care. Please return to the emergency department for any new or worsening symptoms. ° °

## 2022-01-03 ENCOUNTER — Emergency Department (HOSPITAL_COMMUNITY)
Admission: EM | Admit: 2022-01-03 | Discharge: 2022-01-03 | Disposition: A | Discharge status: Home / Self Care | Payer: Self-pay | Attending: Emergency Medicine | Admitting: Emergency Medicine

## 2022-01-03 ENCOUNTER — Other Ambulatory Visit: Payer: Self-pay

## 2022-01-03 ENCOUNTER — Emergency Department (HOSPITAL_COMMUNITY): Payer: Self-pay

## 2022-01-03 ENCOUNTER — Encounter (HOSPITAL_COMMUNITY): Payer: Self-pay

## 2022-01-03 ENCOUNTER — Emergency Department (HOSPITAL_BASED_OUTPATIENT_CLINIC_OR_DEPARTMENT_OTHER)
Admit: 2022-01-03 | Discharge: 2022-01-03 | Disposition: A | Discharge status: Home / Self Care | Payer: Self-pay | Attending: Emergency Medicine | Admitting: Emergency Medicine

## 2022-01-03 DIAGNOSIS — M7989 Other specified soft tissue disorders: Secondary | ICD-10-CM

## 2022-01-03 DIAGNOSIS — M25572 Pain in left ankle and joints of left foot: Secondary | ICD-10-CM

## 2022-01-03 MED ORDER — NAPROXEN 500 MG PO TABS: 500.0000 mg | ORAL_TABLET | Freq: Two times a day (BID) | ORAL | 0 refills | Status: DC

## 2022-01-03 MED ORDER — HYDROCODONE-ACETAMINOPHEN 5-325 MG PO TABS
1.0000 | ORAL_TABLET | Freq: Once | ORAL | Status: AC
  Administered 2022-01-03: 1 via ORAL
  Filled 2022-01-03: qty 1

## 2022-01-03 MED ORDER — CEPHALEXIN 500 MG PO CAPS: 500.0000 mg | ORAL_CAPSULE | Freq: Four times a day (QID) | ORAL | 0 refills | Status: DC

## 2022-01-03 MED ORDER — ACETAMINOPHEN 500 MG PO TABS: 1000.0000 mg | ORAL_TABLET | Freq: Once | ORAL | Status: DC

## 2022-01-03 NOTE — ED Triage Notes (Signed)
Pt complains of left foot pain x 45 mins ago. Pt states that his left foot is swollen.

## 2022-01-03 NOTE — ED Notes (Signed)
Pt is requesting pain medicine and something to eat nurse was informed.

## 2022-01-03 NOTE — ED Provider Notes (Signed)
[facility]EMERGENCY DEPT Provider Note   CSN: [id number] Arrival date & time: 01/03/22  5621     History  Chief Complaint  Patient presents with   Foot Pain    Cole Barrett is a 36 y.o. male.  HPI  36 year old male presents emergency department with left ankle pain.  Patient states that this started last night.  The left ankle and foot swollen, painful especially with weightbearing.  He denies any trauma to the area.  States has never had swelling like this before.  No history of gout.  No history of fevers.  No numbness or tingling to the foot.  No other discoloration.  Home Medications Prior to Admission medications   Medication Sig Start Date End Date Taking? Authorizing Provider  hydrocortisone cream 1 % Apply 1 application topically 2 (two) times daily as needed (For rash). 12/31/21   Sabas Sous, MD  hydrOXYzine (ATARAX) 25 MG tablet Take 1 tablet (25 mg total) by mouth 3 (three) times daily as needed for anxiety or itching. 12/23/21   Ardis Hughs, NP  neomycin-bacitracin-polymyxin (NEOSPORIN) ointment Apply topically 2 (two) times daily. 12/23/21   Ardis Hughs, NP  risperiDONE (RISPERDAL) 1 MG tablet Take 1 tablet (1 mg total) by mouth at bedtime. 12/23/21   Ardis Hughs, NP  amantadine (SYMMETREL) 100 MG capsule Take 1 capsule (100 mg total) by mouth 2 (two) times daily. Patient not taking: Reported on 11/04/2019 09/01/19 11/17/19  [name], NP      Allergies    Patient has no known allergies.    Review of Systems   Review of Systems  Constitutional:  Negative for fever.  Respiratory:  Negative for shortness of breath.   Cardiovascular:  Negative for chest pain.  Gastrointestinal:  Negative for abdominal pain, diarrhea and vomiting.  Musculoskeletal:        Left ankle and foot swelling/pain  Skin:  Negative for rash.  Neurological:  Negative for weakness, numbness and headaches.   Physical Exam Updated  Vital Signs BP 139/88 (BP Location: Left Arm)    Pulse 97    Temp 98 F (36.7 C) (Oral)    Resp 18    SpO2 96%  Physical Exam Vitals and nursing note reviewed.  Constitutional:      Appearance: Normal appearance.  HENT:     Head: Normocephalic.     Mouth/Throat:     Mouth: Mucous membranes are moist.  Cardiovascular:     Rate and Rhythm: Normal rate.  Pulmonary:     Effort: Pulmonary effort is normal. No respiratory distress.  Abdominal:     Palpations: Abdomen is soft.     Tenderness: There is no abdominal tenderness.  Musculoskeletal:     Comments: Left ankle and dorsal aspect of foot are edematous, palpable DP pulses, all 5 toes appear to have normal capillary refill and blood flow.  Pain with movement but motion intact.  Mild overlying erythema, no warmth  Skin:    General: Skin is warm.  Neurological:     Mental Status: He is alert and oriented to person, place, and time. Mental status is at baseline.  Psychiatric:        Mood and Affect: Mood normal.    ED Results / Procedures / Treatments   Labs (all labs ordered are listed, but only abnormal results are displayed) Labs Reviewed - No data to display  EKG None  Radiology No results found.  Procedures Procedures  Medications Ordered in ED Medications  HYDROcodone-acetaminophen (NORCO/VICODIN) 5-325 MG per tablet 1 tablet (has no administration in time range)    ED Course/ Medical Decision Making/ A&P                           Medical Decision Making Amount and/or Complexity of Data Reviewed Radiology: ordered. ECG/medicine tests: ordered.  Risk Prescription drug management.   36 year old male presents emergency department with left ankle swelling.  Patient states he believes this started last evening.  Vital signs are stable.  Denies any erythema, fever, injury to the ankle, foot discoloration.  On physical exam there is mild ankle swelling, overlying dry skin.  Palpable pulses, cap refill intact,  no warmth to suggest cellulitis.  Swelling to me on initial evaluation appears more chronic.  Patient adamantly denies any history of gout.  Ultrasound is negative for DVT.  X-ray shows no acute bony abnormality.  Lower suspicion of gout given no previous history.  Patient improved after pain medication.  I instructed for elevating and ice the foot.  Patient is chronically homeless and there is a potential aspect of cellulitis as he sometimes walks without shoes.  Will cover with antibiotics in light of this.  We will also provide naproxen for pain control and Ace wrap.  Foot/leg is otherwise neurovascularly intact.  Patient at this time appears safe and stable for discharge and close outpatient follow up. Discharge plan and strict return to ED precautions discussed, patient verbalizes understanding and agreement.        Final Clinical Impression(s) / ED Diagnoses Final diagnoses:  None    Rx / DC Orders ED Discharge Orders     None         Rozelle Logan, DO 01/03/22 1116

## 2022-01-03 NOTE — ED Notes (Signed)
Pt sitting in wheelchair next to hallway stretcher, sleeping, sitting upright in wheelchair. This nurse approached the pt and stated that she would move him into ED exam room 8, located across from where he was sitting in the hallway. This nurse asked the pt if he could stand and pivot from wheelchair to ED stretcher, on uninjured right foot. The pt stated he would like to stay in the wheelchair. This nurse moved pt via wheelchair beside ED exam room 8 stretcher and locked wheelchair in placed. Pt proceeded to stand on wheelchair pedals and jump sideways into ED stretcher. Call-light within reach.

## 2022-01-03 NOTE — Discharge Instructions (Signed)
You have been seen and discharged from the emergency department.  Your x-ray showed no fracture.  Your ultrasound showed no blood clot.  Use Ace wrap to create more support.  Elevate and ice the foot.  Take naproxen as needed for pain control.  Follow-up with your primary provider for further evaluation and further care. Take home medications as prescribed. If you have any worsening symptoms, redness of the foot, warmth, foot discoloration or further concerns for your health please return to an emergency department for further evaluation.

## 2022-01-03 NOTE — Progress Notes (Signed)
Left lower extremity venous duplex has been completed. Preliminary results can be found in CV Proc through chart review.  Results were given to Dr. Wilkie Aye.  01/03/22 9:57 AM Olen Cordial RVT

## 2022-01-03 NOTE — ED Notes (Signed)
Pt given food as per requested.

## 2022-01-04 ENCOUNTER — Emergency Department (HOSPITAL_COMMUNITY)
Admission: EM | Admit: 2022-01-04 | Discharge: 2022-01-04 | Disposition: A | Discharge status: Home / Self Care | Payer: Self-pay | Attending: Emergency Medicine | Admitting: Emergency Medicine

## 2022-01-04 ENCOUNTER — Other Ambulatory Visit: Payer: Self-pay

## 2022-01-04 DIAGNOSIS — M79672 Pain in left foot: Secondary | ICD-10-CM | POA: Insufficient documentation

## 2022-01-04 MED ORDER — CEPHALEXIN 250 MG PO CAPS
500.0000 mg | ORAL_CAPSULE | Freq: Once | ORAL | Status: AC
  Administered 2022-01-04: 500 mg via ORAL
  Filled 2022-01-04: qty 2

## 2022-01-04 MED ORDER — NAPROXEN 250 MG PO TABS
500.0000 mg | ORAL_TABLET | Freq: Once | ORAL | Status: AC
  Administered 2022-01-04: 500 mg via ORAL
  Filled 2022-01-04: qty 2

## 2022-01-04 NOTE — ED Provider Notes (Signed)
[facility] EMERGENCY DEPARTMENT Provider Note   CSN: [id number] Arrival date & time: 01/04/22  0725     History  Chief Complaint  Patient presents with   Foot Pain    Cole Barrett is a 36 y.o. male presenting requesting more pain medication for his left foot since he was unable to refill yesterday's prescription. He says he will be able to go to the pharmacy today after work. States "the foot looks and feels better, I just need a dose before work." No numbness, tingling, bleeding or cold foot.     Home Medications Prior to Admission medications   Medication Sig Start Date End Date Taking? Authorizing Provider  cephALEXin (KEFLEX) 500 MG capsule Take 1 capsule (500 mg total) by mouth 4 (four) times daily. 01/03/22   Horton, Alvin Critchley, DO  hydrocortisone cream 1 % Apply 1 application topically 2 (two) times daily as needed (For rash). 12/31/21   Maudie Flakes, MD  hydrOXYzine (ATARAX) 25 MG tablet Take 1 tablet (25 mg total) by mouth 3 (three) times daily as needed for anxiety or itching. 12/23/21   Revonda Humphrey, NP  naproxen (NAPROSYN) 500 MG tablet Take 1 tablet (500 mg total) by mouth 2 (two) times daily. 01/03/22   Horton, Alvin Critchley, DO  neomycin-bacitracin-polymyxin (NEOSPORIN) ointment Apply topically 2 (two) times daily. 12/23/21   Revonda Humphrey, NP  risperiDONE (RISPERDAL) 1 MG tablet Take 1 tablet (1 mg total) by mouth at bedtime. 12/23/21   Revonda Humphrey, NP  amantadine (SYMMETREL) 100 MG capsule Take 1 capsule (100 mg total) by mouth 2 (two) times daily. Patient not taking: Reported on 11/04/2019 09/01/19 11/17/19  Mallie Darting, NP      Allergies    Patient has no known allergies.    Review of Systems   Review of Systems See HPI Physical Exam Updated Vital Signs BP 125/72    Pulse 93    Temp 98.6 F (37 C) (Oral)    Resp 18    SpO2 99%  Physical Exam Vitals and nursing note reviewed.  Constitutional:      Appearance: Normal  appearance.  HENT:     Head: Normocephalic and atraumatic.  Eyes:     General: No scleral icterus.    Conjunctiva/sclera: Conjunctivae normal.  Pulmonary:     Effort: Pulmonary effort is normal. No respiratory distress.  Musculoskeletal:     Comments: Patient with full range of motion of left ankle.  Dopplerable DP pulses. Limb is warm, some erythema to the medial aspect of the left foot.  Dry, hardened and flaky skin on the plantar surface of the foot  Skin:    General: Skin is warm and dry.     Findings: No rash.  Neurological:     Mental Status: He is alert.  Psychiatric:        Mood and Affect: Mood normal.    ED Results / Procedures / Treatments   Labs (all labs ordered are listed, but only abnormal results are displayed) Labs Reviewed - No data to display  EKG None  Radiology DVT study done yesterday and was negative.  Individually interpreted and reviewed by me.  Procedures Procedures    Medications Ordered in ED Medications - No data to display  ED Course/ Medical Decision Making/ A&P                           Medical  Decision Making  36 year old male presenting with foot pain.  Was evaluated fully yesterday but states that he was unable to get his medication I would like a dose of pain medication before going to work.  Physical exam seems to be in line with yesterday.  Likely slightly improved despite patient not getting pain medication or antibiotics.  Patient is well-known to the emergency department due to repeat visits, malingering and housing insecurity.  He will be given a dose of naproxen for his symptoms and discharged at this time.  Encouraged to pick up antibiotics this evening.  Final Clinical Impression(s) / ED Diagnoses Final diagnoses:  Foot pain, left    Rx / DC Orders Results and diagnoses were explained to the patient. Return precautions discussed in full. Patient had no additional questions and expressed complete  understanding.   This chart was dictated using voice recognition software.  Despite best efforts to proofread,  errors can occur which can change the documentation meaning.      Rhae Hammock, PA-C 01/04/22 1020    Lorelle Gibbs, [place] 01/06/22 [phone]

## 2022-01-04 NOTE — Discharge Instructions (Signed)
Pick up your keflex from the pharmacy and take as directed. Try to keep feet clean and dry. Follow-up with your primary care doctor. Return here for new concerns.

## 2022-01-04 NOTE — ED Triage Notes (Signed)
Pt. Stated, I was here last night for left foot pain and was given some pain medication, Im back for some pain pills.

## 2022-01-04 NOTE — ED Notes (Signed)
Patient verbalizes understanding of discharge instructions. Opportunity for questioning and answers were provided. Armband removed by staff, pt discharged from ED via wheelchair to lobby to go home.  

## 2022-01-04 NOTE — ED Triage Notes (Signed)
Pt c/o increasing left foot pain. Pt noticed increase edema and  warmth after getting off of work.

## 2022-01-04 NOTE — Discharge Instructions (Signed)
Please pick up your antibiotics from the pharmacy if you are able.

## 2022-01-04 NOTE — ED Notes (Signed)
Pt 02 sat  97%, unlabored at recheck

## 2022-01-04 NOTE — ED Provider Notes (Signed)
[facility] EMERGENCY DEPARTMENT Provider Note   CSN: [id number] Arrival date & time: 01/04/22  0302     History  Chief Complaint  Patient presents with   Foot Pain    Cole Barrett is a 36 y.o. male.   Foot Pain  36 y.o. M here with left foot pain.  He states increased pain in his left heel, noticed it after he got off work [employer].  States heel is cracked but denies bleeding.  No hx of DM.  He was seen in the ED yesterday for same with negative x-ray and DVT study.  He was given scripts for abx but did not pick them up yet.    Home Medications Prior to Admission medications   Medication Sig Start Date End Date Taking? Authorizing Provider  cephALEXin (KEFLEX) 500 MG capsule Take 1 capsule (500 mg total) by mouth 4 (four) times daily. 01/03/22   Horton, Clabe Seal, DO  hydrocortisone cream 1 % Apply 1 application topically 2 (two) times daily as needed (For rash). 12/31/21   Sabas Sous, MD  hydrOXYzine (ATARAX) 25 MG tablet Take 1 tablet (25 mg total) by mouth 3 (three) times daily as needed for anxiety or itching. 12/23/21   Ardis Hughs, NP  naproxen (NAPROSYN) 500 MG tablet Take 1 tablet (500 mg total) by mouth 2 (two) times daily. 01/03/22   Horton, Clabe Seal, DO  neomycin-bacitracin-polymyxin (NEOSPORIN) ointment Apply topically 2 (two) times daily. 12/23/21   Ardis Hughs, NP  risperiDONE (RISPERDAL) 1 MG tablet Take 1 tablet (1 mg total) by mouth at bedtime. 12/23/21   Ardis Hughs, NP  amantadine (SYMMETREL) 100 MG capsule Take 1 capsule (100 mg total) by mouth 2 (two) times daily. Patient not taking: Reported on 11/04/2019 09/01/19 11/17/19  [name], NP      Allergies    Patient has no known allergies.    Review of Systems   Review of Systems  Musculoskeletal:  Positive for arthralgias.  All other systems reviewed and are negative.  Physical Exam Updated Vital Signs BP (!) 175/81 (BP Location: Right Arm)    Pulse 97     Temp 98.4 F (36.9 C) (Oral)    Resp 17    SpO2 (!) 76%   Physical Exam Vitals and nursing note reviewed.  Constitutional:      Appearance: He is well-developed.  HENT:     Head: Normocephalic and atraumatic.  Eyes:     Conjunctiva/sclera: Conjunctivae normal.     Pupils: Pupils are equal, round, and reactive to light.  Cardiovascular:     Rate and Rhythm: Normal rate and regular rhythm.     Heart sounds: Normal heart sounds.  Pulmonary:     Effort: Pulmonary effort is normal.     Breath sounds: Normal breath sounds.  Abdominal:     General: Bowel sounds are normal.     Palpations: Abdomen is soft.  Musculoskeletal:        General: Normal range of motion.     Cervical back: Normal range of motion.     Comments: Left foot with very dry, cracked skin, particularly along medial heel, there is some mild surrounding erythema and swelling of left medial foot into ankle but no induration or streaking up the leg or into foot; DP pulse intact, moving toes normally, remains ambulatory  Skin:    General: Skin is warm and dry.  Neurological:     Mental Status: He  is alert and oriented to person, place, and time.    ED Results / Procedures / Treatments   Labs (all labs ordered are listed, but only abnormal results are displayed) Labs Reviewed - No data to display  EKG None  Radiology DG Ankle Complete Left  Result Date: 01/03/2022 CLINICAL DATA:  Ankle swelling EXAM: LEFT ANKLE COMPLETE - 3 VIEW COMPARISON:  Radiograph dated January 01, 2017 FINDINGS: No acute fracture or dislocation. Mild degenerative changes of the midfoot. Soft tissue swelling about the ankle. IMPRESSION: No acute osseous abnormality. Electronically Signed   By: Allegra Lai M.D.   On: 01/03/2022 09:33   VAS [place] LOWER EXTREMITY VENOUS (DVT) (ONLY [facility] & [facility])  Result Date: 01/03/2022  Lower Venous DVT Study Patient Name:  Cole Barrett  Date of Exam:   01/03/2022 Medical Rec #: [id number]                  Accession #:    [id number] Date of Birth: 08-11-1986                  Patient Gender: M Patient Age:   44 years Exam Location:  [facility] Procedure:      VAS [place] LOWER EXTREMITY VENOUS (DVT) Referring Phys: KRISTIE HORTON --------------------------------------------------------------------------------  Indications: Swelling.  Risk Factors: None identified. Limitations: Body habitus and poor ultrasound/tissue interface. Comparison Study: No prior studies. Performing Technologist: Chanda Busing RVT  Examination Guidelines: A complete evaluation includes B-mode imaging, spectral Doppler, color Doppler, and power Doppler as needed of all accessible portions of each vessel. Bilateral testing is considered an integral part of a complete examination. Limited examinations for reoccurring indications may be performed as noted. The reflux portion of the exam is performed with the patient in reverse Trendelenburg.  +-----+---------------+---------+-----------+----------+--------------+  RIGHT Compressibility Phasicity Spontaneity Properties Thrombus Aging  +-----+---------------+---------+-----------+----------+--------------+  CFV   Full            Yes       Yes                                    +-----+---------------+---------+-----------+----------+--------------+   +---------+---------------+---------+-----------+----------+--------------+  LEFT      Compressibility Phasicity Spontaneity Properties Thrombus Aging  +---------+---------------+---------+-----------+----------+--------------+  CFV       Full            Yes       Yes                                    +---------+---------------+---------+-----------+----------+--------------+  SFJ       Full                                                             +---------+---------------+---------+-----------+----------+--------------+  FV Prox   Full                                                              +---------+---------------+---------+-----------+----------+--------------+  FV Mid    Full                                                             +---------+---------------+---------+-----------+----------+--------------+  FV Distal Full                                                             +---------+---------------+---------+-----------+----------+--------------+  PFV       Full                                                             +---------+---------------+---------+-----------+----------+--------------+  POP       Full            Yes       Yes                                    +---------+---------------+---------+-----------+----------+--------------+  PTV       Full                                                             +---------+---------------+---------+-----------+----------+--------------+  PERO      Full                                                             +---------+---------------+---------+-----------+----------+--------------+     Summary: RIGHT: - No evidence of common femoral vein obstruction.  LEFT: - There is no evidence of deep vein thrombosis in the lower extremity. However, portions of this examination were limited- see technologist comments above.  - No cystic structure found in the popliteal fossa.  *See table(s) above for measurements and observations. Electronically signed by Gerarda FractionJoshua Robins on 01/03/2022 at 7:51:34 PM.    Final     Procedures Procedures    Medications Ordered in ED Medications - No data to display  ED Course/ Medical Decision Making/ A&P                           Medical Decision Making Risk Prescription drug management.   36 y.o. M here with left foot/ankle pain.  Seen for same yesterday with negative work-up including venous duplex and x-ray of the foot which were both negative.  Left foot with very dry, cracked skin, particularly along the medial heel.  There is some mild erythema of the medial foot and into ankle but there  is no streaking into the  foot or of the leg.  His foot is neurovascularly intact.  Concerned that he may have some developing cellulitis.  It appears that he was prescribed Keflex yesterday but did not pick this up and has not yet started this medication.  Given dose here in the ED and advised to pick this up from pharmacy.  He was given new socks and lotion here in the ED for his feet.  Encouraged follow-up with PCP.  Can return here for new concerns.  Of note, initial O2 sat documented at 76%, however this was a typo.  O2 sats during triage are 97%.  Final Clinical Impression(s) / ED Diagnoses Final diagnoses:  Left foot pain    Rx / DC Orders ED Discharge Orders     None         Garlon Hatchet, PA-C 01/04/22 3235    Shon Baton, MD 01/04/22 0530

## 2022-01-08 ENCOUNTER — Encounter (HOSPITAL_COMMUNITY): Payer: Self-pay

## 2022-01-08 ENCOUNTER — Emergency Department (HOSPITAL_COMMUNITY)
Admission: EM | Admit: 2022-01-08 | Discharge: 2022-01-08 | Disposition: A | Discharge status: Home / Self Care | Payer: Self-pay | Attending: Emergency Medicine | Admitting: Emergency Medicine

## 2022-01-08 ENCOUNTER — Other Ambulatory Visit: Payer: Self-pay

## 2022-01-08 DIAGNOSIS — F17210 Nicotine dependence, cigarettes, uncomplicated: Secondary | ICD-10-CM | POA: Insufficient documentation

## 2022-01-08 DIAGNOSIS — L03116 Cellulitis of left lower limb: Secondary | ICD-10-CM | POA: Insufficient documentation

## 2022-01-08 DIAGNOSIS — Z8616 Personal history of COVID-19: Secondary | ICD-10-CM | POA: Insufficient documentation

## 2022-01-08 MED ORDER — DOXYCYCLINE HYCLATE 100 MG PO CAPS: 100.0000 mg | ORAL_CAPSULE | Freq: Two times a day (BID) | ORAL | 0 refills | Status: AC

## 2022-01-08 MED ORDER — DOXYCYCLINE HYCLATE 100 MG PO TABS
100.0000 mg | ORAL_TABLET | Freq: Once | ORAL | Status: AC
  Administered 2022-01-08: 100 mg via ORAL
  Filled 2022-01-08: qty 1

## 2022-01-08 MED ORDER — NAPROXEN 500 MG PO TABS
500.0000 mg | ORAL_TABLET | Freq: Once | ORAL | Status: AC
  Administered 2022-01-08: 500 mg via ORAL
  Filled 2022-01-08: qty 1

## 2022-01-08 NOTE — ED Provider Notes (Signed)
[facility]-EMERGENCY DEPT [facility] Emergency Department Provider Note MRN:  [id number]  Arrival date & time: 01/08/22     Chief Complaint   Foot Pain   History of Present Illness   Cole Barrett is a 36 y.o. year-old male with a history of schizophrenia presenting to the ED with chief complaint of foot pain.  Continued pain to the left foot and ankle for the past several days, here for more pain medicine.  Denies fever, no other complaints.  Review of Systems  A thorough review of systems was obtained and all systems are negative except as noted in the HPI and PMH.   Patient's Health History    Past Medical History:  Diagnosis Date   COVID-19    Homelessness    Hypercholesterolemia    Mental disorder    Paranoid behavior (HCC)    PE (pulmonary embolism)    Schizophrenia (HCC)     History reviewed. No pertinent surgical history.  Family History  Problem Relation Age of Onset   Hypertension Mother     Social History   Socioeconomic History   Marital status: Single    Spouse name: Not on file   Number of children: Not on file   Years of education: Not on file   Highest education level: Not on file  Occupational History   Occupation: Employed    Comment: [employer]  Tobacco Use   Smoking status: Every Day    Packs/day: 0.50    Types: Cigarettes   Smokeless tobacco: Never  Vaping Use   Vaping Use: Never used  Substance and Sexual Activity   Alcohol use: Yes    Comment: BAC not available   Drug use: Yes    Types: Marijuana    Comment: Last used: 2 months ago UDS NA   Sexual activity: Never  Other Topics Concern   Not on file  Social History Narrative   ** Merged History Encounter **    Pt lives alone in [place].  He is employed.  He stated that he is followed by [facility], but that he has not been there in a while.   Social Determinants of Health   Financial Resource Strain: Not on file  Food Insecurity: Not on file  Transportation  Needs: Not on file  Physical Activity: Not on file  Stress: Not on file  Social Connections: Not on file  Intimate Partner Violence: Not on file     Physical Exam   Vitals:   01/08/22 0029  BP: 129/81  Pulse: 77  Resp: 16  Temp: 98.2 F (36.8 C)  SpO2: 97%    CONSTITUTIONAL: Chronically ill-appearing, NAD NEURO/PSYCH:  Alert and oriented x 3, no focal deficits EYES:  eyes equal and reactive ENT/NECK:  no LAD, no JVD CARDIO: Regular rate, well-perfused, normal S1 and S2 PULM:  CTAB no wheezing or rhonchi GI/GU:  non-distended, non-tender MSK/SPINE:  No gross deformities SKIN: Asymmetric mild swelling to the left foot and ankle with tenderness to palpation, mild increased warmth/erythema   *Additional and/or pertinent findings included in MDM below  Diagnostic and Interventional Summary    EKG Interpretation  Date/Time:    Ventricular Rate:    PR Interval:    QRS Duration:   QT Interval:    QTC Calculation:   R Axis:     Text Interpretation:         Labs Reviewed - No data to display  No orders to display    Medications  doxycycline (  VIBRA-TABS) tablet 100 mg (has no administration in time range)  naproxen (NAPROSYN) tablet 500 mg (has no administration in time range)     Procedures  /  Critical Care Procedures  ED Course and Medical Decision Making  Initial Impression and Ddx Clinically suspicious for cellulitis.  Normal vital signs, no fever, overall mild presentation on exam.  Doubt more significant underlying infection, no fluctuance to suggest abscess, highly doubt osteomyelitis, highly doubt necrotizing infection.  Recent ultrasound and x-ray were normal.  Providing antibiotics, return precautions.  Past medical/surgical history that increases complexity of ED encounter: Schizophrenia  Interpretation of Diagnostics Not applicable  Patient Reassessment and Ultimate Disposition/Management Discharge  Patient management required discussion with  the following services or consulting groups:  None  Complexity of Problems Addressed Chronic illness with exacerbation  Additional Data Reviewed and Analyzed Further history obtained from: Past medical history and medications listed in the EMR, Prior ED visit notes, and Prior labs/imaging results  Additional Factors Impacting ED Encounter Risk SDOH impact on management  Elmer Sow. Pilar Plate, MD [facility] Emergency Medicine [facility] [email or web address]  Final Clinical Impressions(s) / ED Diagnoses     ICD-10-CM   1. Cellulitis of left lower extremity  L03.116       ED Discharge Orders          Ordered    doxycycline (VIBRAMYCIN) 100 MG capsule  2 times daily        01/08/22 0049             Discharge Instructions Discussed with and Provided to Patient:    Discharge Instructions      You were evaluated in the Emergency Department and after careful evaluation, we did not find any emergent condition requiring admission or further testing in the hospital.  Your exam/testing today was overall reassuring.  Your leg may have an infection of the skin.  It is important that you fill the doxycycline antibiotic and take as directed.  Also recommend taking the recently prescribed Naprosyn as directed for pain.  Please return to the Emergency Department if you experience any worsening of your condition.  Thank you for allowing [place] to be a part of your care.       Sabas Sous, MD 01/08/22 [phone]

## 2022-01-08 NOTE — ED Triage Notes (Signed)
Patient arrives to ED stating his left foot is still hurting and he would like more pain medication.

## 2022-01-08 NOTE — Discharge Instructions (Addendum)
You were evaluated in the Emergency Department and after careful evaluation, we did not find any emergent condition requiring admission or further testing in the hospital.  Your exam/testing today was overall reassuring.  Your leg may have an infection of the skin.  It is important that you fill the doxycycline antibiotic and take as directed.  Also recommend taking the recently prescribed Naprosyn as directed for pain.  Please return to the Emergency Department if you experience any worsening of your condition.  Thank you for allowing [place] to be a part of your care.

## 2022-01-09 ENCOUNTER — Emergency Department (HOSPITAL_COMMUNITY)
Admission: EM | Admit: 2022-01-09 | Discharge: 2022-01-09 | Disposition: A | Discharge status: Home / Self Care | Payer: Self-pay | Attending: Emergency Medicine | Admitting: Emergency Medicine

## 2022-01-09 ENCOUNTER — Encounter (HOSPITAL_COMMUNITY): Payer: Self-pay | Admitting: *Deleted

## 2022-01-09 ENCOUNTER — Other Ambulatory Visit: Payer: Self-pay

## 2022-01-09 DIAGNOSIS — R440 Auditory hallucinations: Secondary | ICD-10-CM | POA: Insufficient documentation

## 2022-01-09 DIAGNOSIS — Z760 Encounter for issue of repeat prescription: Secondary | ICD-10-CM | POA: Insufficient documentation

## 2022-01-09 MED ORDER — RISPERIDONE 1 MG PO TABS
1.0000 mg | ORAL_TABLET | Freq: Once | ORAL | Status: AC
  Administered 2022-01-09: 1 mg via ORAL
  Filled 2022-01-09 (×2): qty 1

## 2022-01-09 NOTE — Discharge Instructions (Signed)
Please make sure to take your medications exactly as prescribed.

## 2022-01-09 NOTE — ED Triage Notes (Signed)
Pt says he is hearing voices needs his risperdal dose, last dose last night.

## 2022-01-09 NOTE — ED Provider Notes (Signed)
[facility]-EMERGENCY DEPT Provider Note   CSN: [id number] Arrival date & time: 01/09/22  0003     History  Chief Complaint  Patient presents with   Medication Refill    Cole Barrett is a 36 y.o. male.  The history is provided by the patient.  Medication Refill He has history of hyperlipidemia, schizophrenia with auditory hallucinations, homelessness, pulmonary embolism not currently anticoagulated and comes in saying that he was not able to take his respite all tonight because he was at home and he did not have a way to get home.  He states that his auditory hallucinations are worse than they normally are, but he thinks states they would be okay if he got a dose of Risperdal.  He denies homicidal or suicidal ideation.  He denies current drug or ethanol use.   Home Medications Prior to Admission medications   Medication Sig Start Date End Date Taking? Authorizing Provider  cephALEXin (KEFLEX) 500 MG capsule Take 1 capsule (500 mg total) by mouth 4 (four) times daily. 01/03/22   Horton, Clabe Seal, DO  doxycycline (VIBRAMYCIN) 100 MG capsule Take 1 capsule (100 mg total) by mouth 2 (two) times daily for 10 days. 01/08/22 01/18/22  Sabas Sous, MD  hydrocortisone cream 1 % Apply 1 application topically 2 (two) times daily as needed (For rash). 12/31/21   Sabas Sous, MD  hydrOXYzine (ATARAX) 25 MG tablet Take 1 tablet (25 mg total) by mouth 3 (three) times daily as needed for anxiety or itching. 12/23/21   Ardis Hughs, NP  naproxen (NAPROSYN) 500 MG tablet Take 1 tablet (500 mg total) by mouth 2 (two) times daily. 01/03/22   Horton, Clabe Seal, DO  neomycin-bacitracin-polymyxin (NEOSPORIN) ointment Apply topically 2 (two) times daily. 12/23/21   Ardis Hughs, NP  risperiDONE (RISPERDAL) 1 MG tablet Take 1 tablet (1 mg total) by mouth at bedtime. 12/23/21   Ardis Hughs, NP  amantadine (SYMMETREL) 100 MG capsule Take 1 capsule (100 mg total)  by mouth 2 (two) times daily. Patient not taking: Reported on 11/04/2019 09/01/19 11/17/19  [name], NP      Allergies    Patient has no known allergies.    Review of Systems   Review of Systems  All other systems reviewed and are negative.  Physical Exam Updated Vital Signs BP (!) 147/94 (BP Location: Left Arm)    Pulse 88    Temp 98 F (36.7 C) (Oral)    Resp 16    Ht 5\' 10"  (1.778 m)    Wt 99.8 kg    SpO2 100%    BMI 31.57 kg/m  Physical Exam Vitals and nursing note reviewed.  36 year old male, resting comfortably and in no acute distress. Vital signs are significant for elevated blood pressure. Oxygen saturation is 100%, which is normal. Head is normocephalic and atraumatic. PERRLA, EOMI. Oropharynx is clear. Neck is nontender and supple without adenopathy or JVD. Back is nontender and there is no CVA tenderness. Lungs are clear without rales, wheezes, or rhonchi. Chest is nontender. Heart has regular rate and rhythm without murmur. Abdomen is soft, flat, nontender without masses or hepatosplenomegaly and peristalsis is normoactive. Extremities have no cyanosis or edema, full range of motion is present. Skin is warm and dry without rash. Neurologic: Awake and alert, cranial nerves are intact, moves all extremities equally. Psychiatric: Normal affect.  Does not appear to be responding to internal stimuli.  ED Results /  Procedures / Treatments   Labs (all labs ordered are listed, but only abnormal results are displayed) Labs Reviewed - No data to display  EKG None  Radiology No results found.  Procedures Procedures    Medications Ordered in ED Medications  risperiDONE (RISPERDAL) tablet 1 mg (has no administration in time range)    ED Course/ Medical Decision Making/ A&P                           Medical Decision Making Risk Prescription drug management.   Auditory hallucinations exacerbated by inability to get his dose of risperidone.  Old records  are reviewed, and he had several similar ED visits.  He is given a dose of risperidone and will be observed to make sure that his hallucinations improved.  Following administration of risperidone, patient states that hallucinations are significantly improved and he feels comfortable being discharged.        Final Clinical Impression(s) / ED Diagnoses Final diagnoses:  Auditory hallucinations    Rx / DC Orders ED Discharge Orders     None         [name], MD 01/09/22 [phone]

## 2022-01-11 ENCOUNTER — Telehealth (HOSPITAL_COMMUNITY): Payer: Self-pay

## 2022-01-11 NOTE — BH Assessment (Signed)
Care Management - [facility] Follow Up Discharges  ° °Writer attempted to make contact with patient today and was unsuccessful.  Writer left a HIPPA compliant voice message.  ° °Per chart review, patient was provided with outpatient resources. ° °

## 2022-01-17 ENCOUNTER — Other Ambulatory Visit: Payer: Self-pay

## 2022-01-17 ENCOUNTER — Emergency Department (HOSPITAL_COMMUNITY)
Admission: EM | Admit: 2022-01-17 | Discharge: 2022-01-18 | Disposition: A | Discharge status: Home / Self Care | Payer: Self-pay | Attending: Emergency Medicine | Admitting: Emergency Medicine

## 2022-01-17 ENCOUNTER — Encounter (HOSPITAL_COMMUNITY): Payer: Self-pay

## 2022-01-17 DIAGNOSIS — Z760 Encounter for issue of repeat prescription: Secondary | ICD-10-CM | POA: Insufficient documentation

## 2022-01-17 DIAGNOSIS — Z79899 Other long term (current) drug therapy: Secondary | ICD-10-CM | POA: Insufficient documentation

## 2022-01-17 DIAGNOSIS — Z7689 Persons encountering health services in other specified circumstances: Secondary | ICD-10-CM

## 2022-01-17 DIAGNOSIS — R21 Rash and other nonspecific skin eruption: Secondary | ICD-10-CM | POA: Insufficient documentation

## 2022-01-17 NOTE — ED Triage Notes (Addendum)
Pt states that he needs a refill on his Risperidone and cream for his rash on his stomach.

## 2022-01-18 ENCOUNTER — Other Ambulatory Visit (HOSPITAL_COMMUNITY): Payer: Self-pay

## 2022-01-18 ENCOUNTER — Encounter (HOSPITAL_COMMUNITY): Payer: Self-pay

## 2022-01-18 ENCOUNTER — Other Ambulatory Visit: Payer: Self-pay

## 2022-01-18 ENCOUNTER — Emergency Department (HOSPITAL_COMMUNITY)
Admission: EM | Admit: 2022-01-18 | Discharge: 2022-01-18 | Disposition: A | Discharge status: Home / Self Care | Payer: Self-pay | Attending: Emergency Medicine | Admitting: Emergency Medicine

## 2022-01-18 DIAGNOSIS — Z5900 Homelessness unspecified: Secondary | ICD-10-CM | POA: Insufficient documentation

## 2022-01-18 DIAGNOSIS — F209 Schizophrenia, unspecified: Secondary | ICD-10-CM | POA: Insufficient documentation

## 2022-01-18 DIAGNOSIS — Z760 Encounter for issue of repeat prescription: Secondary | ICD-10-CM | POA: Insufficient documentation

## 2022-01-18 DIAGNOSIS — Z79899 Other long term (current) drug therapy: Secondary | ICD-10-CM

## 2022-01-18 MED ORDER — BACITRACIN ZINC 500 UNIT/GM EX OINT
1.0000 "application " | TOPICAL_OINTMENT | Freq: Two times a day (BID) | CUTANEOUS | Status: DC
  Administered 2022-01-18: 1 via TOPICAL
  Filled 2022-01-18: qty 0.9

## 2022-01-18 MED ORDER — RISPERIDONE 1 MG PO TABS
1.0000 mg | ORAL_TABLET | Freq: Every day | ORAL | Status: DC
  Administered 2022-01-18: 1 mg via ORAL
  Filled 2022-01-18: qty 1

## 2022-01-18 MED ORDER — RISPERIDONE 1 MG PO TABS
1.0000 mg | ORAL_TABLET | Freq: Once | ORAL | Status: AC
Start: 2022-01-18 — End: 2022-01-18
  Administered 2022-01-18: 1 mg via ORAL
  Filled 2022-01-18: qty 1

## 2022-01-18 MED ORDER — BACITRACIN-NEOMYCIN-POLYMYXIN 400-5-5000 EX OINT: TOPICAL_OINTMENT | Freq: Two times a day (BID) | CUTANEOUS | 0 refills | Status: DC

## 2022-01-18 MED ORDER — RISPERIDONE 1 MG PO TABS
1.0000 mg | ORAL_TABLET | Freq: Every day | ORAL | 0 refills | Status: DC
  Filled 2022-01-18: qty 30, 30d supply, fill #0

## 2022-01-18 NOTE — ED Triage Notes (Signed)
Pt states that he needs his morning medication.

## 2022-01-18 NOTE — ED Provider Notes (Signed)
[facility]-EMERGENCY DEPT Provider Note   CSN: [id number] Arrival date & time: 01/17/22  2306     History  Chief Complaint  Patient presents with   Medication Refill    Cole Barrett is a 36 y.o. male.  36 year old male with a history of schizophrenia, paranoia, PE (not chronically anticoagulated), homelessness presents to the emergency department requesting a dose of his Risperdal.  He has been seen in the emergency department previously for similar requests.  Also asking for a cream for a rash on his abdomen.  He states that he has had this rash for a number of weeks.  Thinks it developed from boxes rubbing up against his abdomen.  Denies fevers and drainage.  The history is provided by the patient. No language interpreter was used.  Medication Refill     Home Medications Prior to Admission medications   Medication Sig Start Date End Date Taking? Authorizing Provider  cephALEXin (KEFLEX) 500 MG capsule Take 1 capsule (500 mg total) by mouth 4 (four) times daily. 01/03/22   Horton, Clabe Seal, DO  doxycycline (VIBRAMYCIN) 100 MG capsule Take 1 capsule (100 mg total) by mouth 2 (two) times daily for 10 days. 01/08/22 01/18/22  Sabas Sous, MD  hydrocortisone cream 1 % Apply 1 application topically 2 (two) times daily as needed (For rash). 12/31/21   Sabas Sous, MD  hydrOXYzine (ATARAX) 25 MG tablet Take 1 tablet (25 mg total) by mouth 3 (three) times daily as needed for anxiety or itching. 12/23/21   Ardis Hughs, NP  naproxen (NAPROSYN) 500 MG tablet Take 1 tablet (500 mg total) by mouth 2 (two) times daily. 01/03/22   Horton, Clabe Seal, DO  neomycin-bacitracin-polymyxin (NEOSPORIN) ointment Apply topically 2 (two) times daily. 01/18/22   [name], PA-C  risperiDONE (RISPERDAL) 1 MG tablet Take 1 tablet (1 mg total) by mouth at bedtime. 12/23/21   Ardis Hughs, NP  amantadine (SYMMETREL) 100 MG capsule Take 1 capsule (100 mg total) by  mouth 2 (two) times daily. Patient not taking: Reported on 11/04/2019 09/01/19 11/17/19  [name], NP      Allergies    Patient has no known allergies.    Review of Systems   Review of Systems Ten systems reviewed and are negative for acute change, except as noted in the HPI.    Physical Exam Updated Vital Signs BP (!) 145/86    Pulse 83    Temp 98.4 F (36.9 C) (Oral)    Resp 16    SpO2 97%   Physical Exam Vitals and nursing note reviewed.  Constitutional:      General: He is not in acute distress.    Appearance: He is well-developed. He is not diaphoretic.     Comments: Nontoxic appearing and in NAD  HENT:     Head: Normocephalic and atraumatic.  Eyes:     General: No scleral icterus.    Conjunctiva/sclera: Conjunctivae normal.  Pulmonary:     Effort: Pulmonary effort is normal. No respiratory distress.  Musculoskeletal:        General: Normal range of motion.     Cervical back: Normal range of motion.     Comments: Chronic appearing macular rash to abdominal wall without drainage, induration, heat to touch, fluctuance. Rash appears almost ulcerative in areas, but with various stages of healing. See image below.  Skin:    General: Skin is warm and dry.     Coloration: Skin  is not pale.     Findings: Rash present. No erythema.  Neurological:     Mental Status: He is alert and oriented to person, place, and time.  Psychiatric:        Behavior: Behavior normal.     Comments: Calm and cooperative. Not reacting to internal stimuli.       ED Results / Procedures / Treatments   Labs (all labs ordered are listed, but only abnormal results are displayed) Labs Reviewed - No data to display  EKG None  Radiology No results found.  Procedures Procedures    Medications Ordered in ED Medications  risperiDONE (RISPERDAL) tablet 1 mg (has no administration in time range)  bacitracin ointment 1 application (has no administration in time range)    ED Course/  Medical Decision Making/ A&P                           Medical Decision Making  Patient requesting dose of his psychiatric medication.  He was given a tablet of his Risperdal prior to discharge.  Does not express any complaints of SI/HI.  Is not reacting to internal stimuli.  Is calm and cooperative.  Stable for continued outpatient follow-up with his psychiatric providers.  Also requesting a cream for his rash which he reports has been present for a number of weeks.  There does not appear to be any evidence of secondary infection/cellulitis or abscess.  The patient is afebrile with reassuring vital signs.  No criteria for SIRS or sepsis.  Bacitracin applied prior to discharge.  Will provide Rx for Neosporin.  Return precautions discussed and provided. Patient discharged in stable condition with no unaddressed concerns.        Final Clinical Impression(s) / ED Diagnoses Final diagnoses:  Encounter for medication administration  Rash and nonspecific skin eruption    Rx / DC Orders ED Discharge Orders          Ordered    neomycin-bacitracin-polymyxin (NEOSPORIN) ointment  2 times daily        01/18/22 0019              [name], PA-C 01/18/22 0030    Franne Forts, DO 01/19/22 [phone]

## 2022-01-18 NOTE — Discharge Instructions (Addendum)
Apply Neosporin as prescribed to the site of your rash. Follow up with your primary care doctor. Continue your prescribed psychiatric medications.

## 2022-01-18 NOTE — Discharge Instructions (Signed)
Please follow up with your normal prescriber and try to take your Risperdone as prescribed. Please return to the ED if you have any worsening pain, concerns.

## 2022-01-18 NOTE — ED Provider Notes (Signed)
[facility]-EMERGENCY DEPT Provider Note   CSN: [id number] Arrival date & time: 01/18/22  0530     History  Chief Complaint  Patient presents with   Medication Refill    Cole Barrett is a 36 y.o. male with past medical history of schizophrenia, paranoia, pulmonary embolism, homelessness who presents the emergency department requesting dose of his risperidone.  He has been seen multiple times this month for the same.  He has no other complaints at this time.  He does not endorse active hallucinations but reports that he will have them if he does not get his medication.  Patient denies any SI, HI.   Medication Refill     Home Medications Prior to Admission medications   Medication Sig Start Date End Date Taking? Authorizing Provider  cephALEXin (KEFLEX) 500 MG capsule Take 1 capsule (500 mg total) by mouth 4 (four) times daily. 01/03/22   Horton, Clabe Seal, DO  doxycycline (VIBRAMYCIN) 100 MG capsule Take 1 capsule (100 mg total) by mouth 2 (two) times daily for 10 days. 01/08/22 01/18/22  Sabas Sous, MD  hydrocortisone cream 1 % Apply 1 application topically 2 (two) times daily as needed (For rash). 12/31/21   Sabas Sous, MD  hydrOXYzine (ATARAX) 25 MG tablet Take 1 tablet (25 mg total) by mouth 3 (three) times daily as needed for anxiety or itching. 12/23/21   Ardis Hughs, NP  naproxen (NAPROSYN) 500 MG tablet Take 1 tablet (500 mg total) by mouth 2 (two) times daily. 01/03/22   Horton, Clabe Seal, DO  neomycin-bacitracin-polymyxin (NEOSPORIN) ointment Apply topically 2 (two) times daily. 01/18/22   [name], PA-C  risperiDONE (RISPERDAL) 1 MG tablet Take 1 tablet (1 mg total) by mouth at bedtime. 12/23/21   Ardis Hughs, NP  amantadine (SYMMETREL) 100 MG capsule Take 1 capsule (100 mg total) by mouth 2 (two) times daily. Patient not taking: Reported on 11/04/2019 09/01/19 11/17/19  [name], NP      Allergies    Patient has no  known allergies.    Review of Systems   Review of Systems  All other systems reviewed and are negative.  Physical Exam Updated Vital Signs BP (!) 138/95    Pulse 72    Temp 98 F (36.7 C) (Oral)    Resp 18    Ht 5\' 10"  (1.778 m)    Wt 99.8 kg    SpO2 96%    BMI 31.57 kg/m  Physical Exam Vitals and nursing note reviewed.  Constitutional:      General: He is not in acute distress.    Appearance: Normal appearance.  HENT:     Head: Normocephalic and atraumatic.  Eyes:     General:        Right eye: No discharge.        Left eye: No discharge.  Cardiovascular:     Rate and Rhythm: Normal rate and regular rhythm.  Pulmonary:     Effort: Pulmonary effort is normal. No respiratory distress.  Musculoskeletal:        General: No deformity.  Skin:    General: Skin is warm and dry.  Neurological:     General: No focal deficit present.     Mental Status: He is alert and oriented to person, place, and time.  Psychiatric:        Mood and Affect: Mood normal.        Behavior: Behavior normal.  Thought Content: Thought content normal.    ED Results / Procedures / Treatments   Labs (all labs ordered are listed, but only abnormal results are displayed) Labs Reviewed - No data to display  EKG None  Radiology No results found.  Procedures Procedures    Medications Ordered in ED Medications  risperiDONE (RISPERDAL) tablet 1 mg (has no administration in time range)    ED Course/ Medical Decision Making/ A&P                            Medical Decision Making Risk Prescription drug management.   36 year old patient with a history of homelessness,  schizophrenia, paranoia, pulmonary embolism who presents with concern for needing his risperidone medication.  He seen multiple times recently for the same.  He reports that [place] does [place] a [place], we will send a prescription to the local pharmacy, administer 1 dose of the medication  here, discharge patient.  He has no other concerns at this time, denies any pain, denies SI, HI, AVH at this time. Final Clinical Impression(s) / ED Diagnoses Final diagnoses:  Medication management    Rx / DC Orders ED Discharge Orders     None         [place] 01/18/22 9983    Margarita Grizzle, MD 01/20/22 [phone]

## 2022-01-23 ENCOUNTER — Emergency Department (HOSPITAL_COMMUNITY)
Admission: EM | Admit: 2022-01-23 | Discharge: 2022-01-24 | Disposition: A | Discharge status: Home / Self Care | Payer: Self-pay | Attending: Emergency Medicine | Admitting: Emergency Medicine

## 2022-01-23 ENCOUNTER — Other Ambulatory Visit: Payer: Self-pay

## 2022-01-23 ENCOUNTER — Encounter (HOSPITAL_COMMUNITY): Payer: Self-pay

## 2022-01-23 DIAGNOSIS — Z760 Encounter for issue of repeat prescription: Secondary | ICD-10-CM | POA: Insufficient documentation

## 2022-01-23 DIAGNOSIS — R21 Rash and other nonspecific skin eruption: Secondary | ICD-10-CM | POA: Insufficient documentation

## 2022-01-23 MED ORDER — RISPERIDONE 1 MG PO TABS
1.0000 mg | ORAL_TABLET | Freq: Once | ORAL | Status: AC
  Administered 2022-01-24: 1 mg via ORAL
  Filled 2022-01-23: qty 1

## 2022-01-23 MED ORDER — HYDROCORTISONE 1 % EX CREA
TOPICAL_CREAM | Freq: Two times a day (BID) | CUTANEOUS | Status: DC
  Filled 2022-01-23: qty 28

## 2022-01-23 NOTE — ED Provider Notes (Signed)
[facility] DEPT Provider Note   CSN: [id number] Arrival date & time: 01/23/22  2334     History  Chief Complaint  Patient presents with   Medication Refill    Cole Barrett is a 36 y.o. male.  The history is provided by the patient and medical records.  Medication Refill  36 year old male presenting to the ED requesting dose of his Resporal and cream for rash on his abdomen.  States it has been working well but has since run out.  Denies any SI/HI.  Home Medications Prior to Admission medications   Medication Sig Start Date End Date Taking? Authorizing Provider  cephALEXin (KEFLEX) 500 MG capsule Take 1 capsule (500 mg total) by mouth 4 (four) times daily. 01/03/22   Horton, Alvin Critchley, DO  hydrocortisone cream 1 % Apply 1 application topically 2 (two) times daily as needed (For rash). 12/31/21   Maudie Flakes, MD  hydrOXYzine (ATARAX) 25 MG tablet Take 1 tablet (25 mg total) by mouth 3 (three) times daily as needed for anxiety or itching. 12/23/21   Revonda Humphrey, NP  naproxen (NAPROSYN) 500 MG tablet Take 1 tablet (500 mg total) by mouth 2 (two) times daily. 01/03/22   Horton, Alvin Critchley, DO  neomycin-bacitracin-polymyxin (NEOSPORIN) ointment Apply topically 2 (two) times daily. 01/18/22   Antonietta Breach, PA-C  risperiDONE (RISPERDAL) 1 MG tablet Take 1 tablet (1 mg total) by mouth at bedtime. 01/18/22   Prosperi, Christian H, PA-C  amantadine (SYMMETREL) 100 MG capsule Take 1 capsule (100 mg total) by mouth 2 (two) times daily. Patient not taking: Reported on 11/04/2019 09/01/19 11/17/19  Mallie Darting, NP      Allergies    Patient has no known allergies.    Review of Systems   Review of Systems  Constitutional:        Med refill  All other systems reviewed and are negative.  Physical Exam Updated Vital Signs BP (!) 167/88    Pulse 72    Temp 98 F (36.7 C) (Oral)    Resp 17    Ht 5\' 10"  (1.778 m)    Wt 95.3 kg    SpO2 98%    BMI  30.13 kg/m  Physical Exam Vitals and nursing note reviewed.  Constitutional:      Appearance: He is well-developed.  HENT:     Head: Normocephalic and atraumatic.  Eyes:     Conjunctiva/sclera: Conjunctivae normal.     Pupils: Pupils are equal, round, and reactive to light.  Cardiovascular:     Rate and Rhythm: Normal rate and regular rhythm.     Heart sounds: Normal heart sounds.  Pulmonary:     Effort: Pulmonary effort is normal. No respiratory distress.     Breath sounds: Normal breath sounds. No rhonchi.  Abdominal:     General: Bowel sounds are normal.     Palpations: Abdomen is soft.  Musculoskeletal:        General: Normal range of motion.     Cervical back: Normal range of motion.  Skin:    General: Skin is warm and dry.  Neurological:     Mental Status: He is alert and oriented to person, place, and time.    ED Results / Procedures / Treatments   Labs (all labs ordered are listed, but only abnormal results are displayed) Labs Reviewed - No data to display  EKG None  Radiology No results found.  Procedures Procedures  Medications Ordered in ED Medications  hydrocortisone cream 1 % ( Topical Given 01/24/22 0007)  risperiDONE (RISPERDAL) tablet 1 mg (1 mg Oral Given 01/24/22 0007)    ED Course/ Medical Decision Making/ A&P                           Medical Decision Making Risk Prescription drug management.   36 year old male presenting to the ED requesting refill.  States he needs his risperidone and refill of hydrocortisone cream.  Has been using it for rash on his abdomen with good relief but is since run out.  Denies any SI or HI.  Medications given.  Stable for discharge.    Final Clinical Impression(s) / ED Diagnoses Final diagnoses:  Medication refill    Rx / DC Orders ED Discharge Orders     None         Larene Pickett, PA-C 01/24/22 0450    Quintella Reichert, MD 01/24/22 [phone]

## 2022-01-23 NOTE — ED Triage Notes (Signed)
Pt reports that he needs his dose of Risperidone and the cream for the rash on his abdomen.

## 2022-01-25 ENCOUNTER — Other Ambulatory Visit: Payer: Self-pay

## 2022-01-25 ENCOUNTER — Encounter (HOSPITAL_COMMUNITY): Payer: Self-pay | Admitting: Emergency Medicine

## 2022-01-25 ENCOUNTER — Emergency Department (HOSPITAL_COMMUNITY)
Admission: EM | Admit: 2022-01-25 | Discharge: 2022-01-25 | Disposition: A | Discharge status: Home / Self Care | Payer: Self-pay | Attending: Emergency Medicine | Admitting: Emergency Medicine

## 2022-01-25 DIAGNOSIS — Z760 Encounter for issue of repeat prescription: Secondary | ICD-10-CM | POA: Insufficient documentation

## 2022-01-25 DIAGNOSIS — R442 Other hallucinations: Secondary | ICD-10-CM | POA: Insufficient documentation

## 2022-01-25 DIAGNOSIS — R443 Hallucinations, unspecified: Secondary | ICD-10-CM

## 2022-01-25 MED ORDER — RISPERIDONE 1 MG PO TABS
1.0000 mg | ORAL_TABLET | Freq: Once | ORAL | Status: AC
  Administered 2022-01-25: 1 mg via ORAL
  Filled 2022-01-25: qty 1

## 2022-01-25 NOTE — ED Provider Notes (Signed)
[facility] EMERGENCY DEPARTMENT Provider Note   CSN: [id number] Arrival date & time: 01/25/22  0231     History  Chief Complaint  Patient presents with   Medication Refill    Cole Barrett is a 36 y.o. male.  The history is provided by the patient and medical records.  Medication Refill  36 year old male presenting to the ED requesting dose of his Risperdal.  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  Denies any SI/HI.  Seen frequently for same.  Home Medications Prior to Admission medications   Medication Sig Start Date End Date Taking? Authorizing Provider  cephALEXin (KEFLEX) 500 MG capsule Take 1 capsule (500 mg total) by mouth 4 (four) times daily. 01/03/22   Horton, Clabe Seal, DO  hydrocortisone cream 1 % Apply 1 application topically 2 (two) times daily as needed (For rash). 12/31/21   Sabas Sous, MD  hydrOXYzine (ATARAX) 25 MG tablet Take 1 tablet (25 mg total) by mouth 3 (three) times daily as needed for anxiety or itching. 12/23/21   Ardis Hughs, NP  naproxen (NAPROSYN) 500 MG tablet Take 1 tablet (500 mg total) by mouth 2 (two) times daily. 01/03/22   Horton, Clabe Seal, DO  neomycin-bacitracin-polymyxin (NEOSPORIN) ointment Apply topically 2 (two) times daily. 01/18/22   [name], PA-C  risperiDONE (RISPERDAL) 1 MG tablet Take 1 tablet (1 mg total) by mouth at bedtime. 01/18/22   Prosperi, Christian H, PA-C  amantadine (SYMMETREL) 100 MG capsule Take 1 capsule (100 mg total) by mouth 2 (two) times daily. Patient not taking: Reported on 11/04/2019 09/01/19 11/17/19  [name], NP      Allergies    Patient has no known allergies.    Review of Systems   Review of Systems  Psychiatric/Behavioral:  Positive for hallucinations (chronic).   All other systems reviewed and are negative.  Physical Exam Updated Vital Signs BP (!) 122/59 (BP Location: Right Arm)    Pulse 79    Temp 98 F (36.7 C) (Oral)    Resp 18    SpO2 97%   Physical Exam Vitals and nursing note reviewed.  Constitutional:      Appearance: He is well-developed.     Comments: Sleeping in triage chair, awoken for exam  HENT:     Head: Normocephalic and atraumatic.  Eyes:     Conjunctiva/sclera: Conjunctivae normal.     Pupils: Pupils are equal, round, and reactive to light.  Cardiovascular:     Rate and Rhythm: Normal rate and regular rhythm.     Heart sounds: Normal heart sounds.  Pulmonary:     Effort: Pulmonary effort is normal.     Breath sounds: Normal breath sounds.  Abdominal:     General: Bowel sounds are normal.     Palpations: Abdomen is soft.  Musculoskeletal:        General: Normal range of motion.     Cervical back: Normal range of motion.  Skin:    General: Skin is warm and dry.  Neurological:     Mental Status: He is alert and oriented to person, place, and time.    ED Results / Procedures / Treatments   Labs (all labs ordered are listed, but only abnormal results are displayed) Labs Reviewed - No data to display  EKG None  Radiology No results found.  Procedures Procedures    Medications Ordered in ED Medications  risperiDONE (RISPERDAL) tablet 1 mg (has no administration in  time range)    ED Course/ Medical Decision Making/ A&P                           Medical Decision Making Risk Prescription drug management.   36 year old male here with chronic hallucinations.  Requesting dose of his Risperdal.  Denies any SI/HI.  Does not appear to be a danger to himself or others at this time.  Will give dose of Risperdal.  Stable for discharge with outpatient follow-up.  Final Clinical Impression(s) / ED Diagnoses Final diagnoses:  Hallucinations    Rx / DC Orders ED Discharge Orders     None         Garlon Hatchet, PA-C 01/25/22 0351    Gilda Crease, MD 01/25/22 [phone]

## 2022-01-25 NOTE — ED Triage Notes (Signed)
Patient states he needs his Risperdal.

## 2022-01-26 ENCOUNTER — Other Ambulatory Visit (HOSPITAL_COMMUNITY): Payer: Self-pay

## 2022-02-01 ENCOUNTER — Encounter (HOSPITAL_COMMUNITY): Payer: Self-pay | Admitting: *Deleted

## 2022-02-01 ENCOUNTER — Other Ambulatory Visit: Payer: Self-pay

## 2022-02-01 ENCOUNTER — Emergency Department (HOSPITAL_COMMUNITY)
Admission: EM | Admit: 2022-02-01 | Discharge: 2022-02-02 | Disposition: A | Discharge status: Home / Self Care | Payer: Self-pay | Attending: Emergency Medicine | Admitting: Emergency Medicine

## 2022-02-01 DIAGNOSIS — Z5900 Homelessness unspecified: Secondary | ICD-10-CM | POA: Insufficient documentation

## 2022-02-01 DIAGNOSIS — R21 Rash and other nonspecific skin eruption: Secondary | ICD-10-CM | POA: Insufficient documentation

## 2022-02-01 DIAGNOSIS — Z760 Encounter for issue of repeat prescription: Secondary | ICD-10-CM | POA: Insufficient documentation

## 2022-02-01 MED ORDER — RISPERIDONE 1 MG PO TABS
1.0000 mg | ORAL_TABLET | Freq: Once | ORAL | Status: AC
  Administered 2022-02-02: 1 mg via ORAL
  Filled 2022-02-01: qty 1

## 2022-02-01 NOTE — ED Provider Triage Note (Signed)
Emergency Medicine Provider Triage Evaluation Note ? ?Cole Barrett , a 36 y.o. male  was evaluated in triage.  Pt complains of here to request his daily Risperdal dose and cream for his rash.  Numerous presentations to the ER for same, no acute changes. ? ?Review of Systems  ?Positive: Medication request ?Negative: Fevers or other acute complaints ? ?Physical Exam  ?BP 137/81 (BP Location: Left Arm)   Pulse 80   Temp 98.5 ?F (36.9 ?C) (Oral)   Resp 18   Ht 5\' 10"  (1.778 m)   Wt 95.3 kg   SpO2 97%   BMI 30.13 kg/m?  ?Gen:   Awake, no distress   ?Resp:  Normal effort  ?MSK:   Moves extremities without difficulty  ?Other:   ? ?Medical Decision Making  ?Medically screening exam initiated at 10:50 PM.  Appropriate orders placed.  Cole Barrett was informed that the remainder of the evaluation will be completed by another provider, this initial triage assessment does not replace that evaluation, and the importance of remaining in the ED until their evaluation is complete. ? ?Risperdal ordered from pharmacy ?  ?Cole Hawking, PA-C ?02/01/22 2251 ? ?

## 2022-02-01 NOTE — ED Triage Notes (Signed)
Pt ambulatory to triage requesting his Risperdal and his cream for his rash ?

## 2022-02-02 MED ORDER — BACITRACIN ZINC 500 UNIT/GM EX OINT
TOPICAL_OINTMENT | Freq: Two times a day (BID) | CUTANEOUS | Status: DC
  Administered 2022-02-02: 1 via TOPICAL
  Filled 2022-02-02: qty 1.8

## 2022-02-02 NOTE — ED Provider Notes (Signed)
? [facility] ?Provider Note ? ? ?CSN: [id number] ?Arrival date & time: 02/01/22  2201 ? ?  ? ?History ? ?Chief Complaint  ?Patient presents with  ? Medication Refill  ? ? ?Cole Barrett is a 36 y.o. male with history of schizophrenia, homelessness, and substance use who presents to the emergency department with complaints of needing a dose of his Risperdal and states he needs cream for his rash that has been present for an extended period of time.  No alleviating or aggravating factors.  Denies SI, HI, or hallucinations. ? ?HPI ? ?  ? ?Home Medications ?Prior to Admission medications   ?Medication Sig Start Date End Date Taking? Authorizing Provider  ?cephALEXin (KEFLEX) 500 MG capsule Take 1 capsule (500 mg total) by mouth 4 (four) times daily. 01/03/22   Horton, Clabe Seal, DO  ?hydrocortisone cream 1 % Apply 1 application topically 2 (two) times daily as needed (For rash). 12/31/21   Sabas Sous, MD  ?hydrOXYzine (ATARAX) 25 MG tablet Take 1 tablet (25 mg total) by mouth 3 (three) times daily as needed for anxiety or itching. 12/23/21   Ardis Hughs, NP  ?naproxen (NAPROSYN) 500 MG tablet Take 1 tablet (500 mg total) by mouth 2 (two) times daily. 01/03/22   Horton, Clabe Seal, DO  ?neomycin-bacitracin-polymyxin (NEOSPORIN) ointment Apply topically 2 (two) times daily. 01/18/22   [name], PA-C  ?risperiDONE (RISPERDAL) 1 MG tablet Take 1 tablet (1 mg total) by mouth at bedtime. 01/18/22   Prosperi, Christian H, PA-C  ?amantadine (SYMMETREL) 100 MG capsule Take 1 capsule (100 mg total) by mouth 2 (two) times daily. ?Patient not taking: Reported on 11/04/2019 09/01/19 11/17/19  [name], NP  ?   ? ?Allergies    ?Patient has no known allergies.   ? ?Review of Systems   ?Review of Systems  ?Respiratory:  Negative for shortness of breath.   ?Cardiovascular:  Negative for chest pain.  ?Gastrointestinal:  Negative for abdominal pain.  ?Skin:  Positive for rash.   ?Neurological:  Negative for syncope.  ?Psychiatric/Behavioral:  Negative for hallucinations and suicidal ideas.   ?All other systems reviewed and are negative. ? ?Physical Exam ?Updated Vital Signs ?BP 137/81 (BP Location: Left Arm)   Pulse 80   Temp 98.5 ?F (36.9 ?C) (Oral)   Resp 18   Ht 5\' 10"  (1.778 m)   Wt 95.3 kg   SpO2 97%   BMI 30.13 kg/m?  ?Physical Exam ?Vitals and nursing note reviewed.  ?Constitutional:   ?   General: He is not in acute distress. ?   Appearance: He is well-developed. He is not toxic-appearing.  ?HENT:  ?   Head: Normocephalic and atraumatic.  ?Eyes:  ?   General:     ?   Right eye: No discharge.     ?   Left eye: No discharge.  ?   Conjunctiva/sclera: Conjunctivae normal.  ?Cardiovascular:  ?   Rate and Rhythm: Normal rate and regular rhythm.  ?Pulmonary:  ?   Effort: Pulmonary effort is normal. No respiratory distress.  ?   Breath sounds: Normal breath sounds. No wheezing, rhonchi or rales.  ?Abdominal:  ?   General: There is no distension.  ?   Palpations: Abdomen is soft.  ?   Tenderness: There is no abdominal tenderness.  ?Musculoskeletal:  ?   Cervical back: Neck supple.  ?Skin: ?   General: Skin is warm and dry.  ?   Comments:  Patient has multiple scattered scabbed areas some open - variable stages of healing to the abdomen. There is no surrounding erythema, purulent drainage, induration, or fluctuance.  There is no palm involvement.  No mucous membrane involvement.  No urticaria.  No vesicles.   ?Neurological:  ?   Mental Status: He is alert.  ?   Comments: Clear speech.   ?Psychiatric:     ?   Behavior: Behavior is cooperative.  ? ? ?ED Results / Procedures / Treatments   ?Labs ?(all labs ordered are listed, but only abnormal results are displayed) ?Labs Reviewed - No data to display ? ?EKG ?None ? ?Radiology ?No results found. ? ?Procedures ?Procedures  ? ? ?Medications Ordered in ED ?Medications  ?risperiDONE (RISPERDAL) tablet 1 mg (1 mg Oral Given 02/02/22 0024)   ? ? ?ED Course/ Medical Decision Making/ A&P ?  ?                        ?Medical Decision Making ? ?Patient presents to the ED requesting a dose of his Risperdal as well as cream for his abdominal rash which has been present for extended period of time.  Patient is nontoxic, resting comfortably, vitals are within normal limits.  Chart reviewed for additional history, several ED visits in the past.  ? ?Patient appears in his usual state of health at this time.  He is denying SI, HI, or active hallucinations.  He was given a dose of his Risperdal.  In terms of his abdominal rash- multiple small scabs/open wounds to the abdomen- appears to have had similar in chart review.  There is no significant erythema, warmth, or purulent drainage.  No obvious abscess.  No palm involvement.  Airways patent.  No angioedema.  Overall low suspicion for for SJS, TEN, TSS, tick borne illness, syphilis or other life-threatening condition at this time.  We will provide bacitracin ointment for him to go home with. I discussed treatment plan, need for follow-up, and return precautions with the patient. Provided opportunity for questions, patient confirmed understanding and is in agreement with plan.  ? ? ?Portions of this note were generated with Scientist, clinical (histocompatibility and immunogenetics). Dictation errors may occur despite best attempts at proofreading. ? ? ? ? ? ? ? ? ?Final Clinical Impression(s) / ED Diagnoses ?Final diagnoses:  ?Medication refill  ? ? ?Rx / DC Orders ?ED Discharge Orders   ? ? None  ? ?  ? ? ?  ?Cherly Anderson, PA-C ?02/02/22 0121 ? ?  ?Geoffery Lyons, MD ?02/02/22 [phone] ? ?

## 2022-02-05 ENCOUNTER — Emergency Department (HOSPITAL_COMMUNITY)
Admission: EM | Admit: 2022-02-05 | Discharge: 2022-02-05 | Disposition: A | Discharge status: Home / Self Care | Payer: Self-pay | Attending: Emergency Medicine | Admitting: Emergency Medicine

## 2022-02-05 ENCOUNTER — Encounter (HOSPITAL_COMMUNITY): Payer: Self-pay

## 2022-02-05 ENCOUNTER — Other Ambulatory Visit: Payer: Self-pay

## 2022-02-05 DIAGNOSIS — Z8616 Personal history of COVID-19: Secondary | ICD-10-CM | POA: Insufficient documentation

## 2022-02-05 DIAGNOSIS — S30811A Abrasion of abdominal wall, initial encounter: Secondary | ICD-10-CM | POA: Insufficient documentation

## 2022-02-05 DIAGNOSIS — Z79899 Other long term (current) drug therapy: Secondary | ICD-10-CM | POA: Insufficient documentation

## 2022-02-05 DIAGNOSIS — X58XXXA Exposure to other specified factors, initial encounter: Secondary | ICD-10-CM | POA: Insufficient documentation

## 2022-02-05 DIAGNOSIS — Z5181 Encounter for therapeutic drug level monitoring: Secondary | ICD-10-CM | POA: Insufficient documentation

## 2022-02-05 MED ORDER — RISPERIDONE 1 MG PO TABS
1.0000 mg | ORAL_TABLET | Freq: Once | ORAL | Status: AC
Start: 2022-02-05 — End: 2022-02-05
  Administered 2022-02-05: 1 mg via ORAL
  Filled 2022-02-05: qty 1

## 2022-02-05 MED ORDER — BACITRACIN-NEOMYCIN-POLYMYXIN OINTMENT TUBE
TOPICAL_OINTMENT | Freq: Two times a day (BID) | CUTANEOUS | Status: DC
  Filled 2022-02-05: qty 14

## 2022-02-05 MED ORDER — ACETAMINOPHEN 325 MG PO TABS
650.0000 mg | ORAL_TABLET | Freq: Once | ORAL | Status: AC
  Administered 2022-02-05: 650 mg via ORAL
  Filled 2022-02-05: qty 2

## 2022-02-05 NOTE — ED Provider Notes (Signed)
?[facility] ?Provider Note ? ?CSN: [id number] ?Arrival date & time: 02/05/22 0405 ? ?Chief Complaint(s) ?Rash ? ?HPI ?Cole Barrett is a 36 y.o. male with history of schizophrenia, homelessness, and substance use who presents to the emergency department with complaints of needing a dose of his Risperdal and states he needs cream for his rash that has been present for an extended period of time.  No alleviating or aggravating factors.  Denies SI, HI, or hallucinations  ? ? ?Rash ? ?Past Medical History ?Past Medical History:  ?Diagnosis Date  ? COVID-19   ? Homelessness   ? Hypercholesterolemia   ? Mental disorder   ? Paranoid behavior ([place])   ? PE (pulmonary embolism)   ? Schizophrenia ([place])   ? ?Patient Active Problem List  ? Diagnosis Date Noted  ? Acute psychosis ([place]) 08/31/2019  ? Schizophrenia ([place]) 08/31/2019  ? Schizoaffective disorder, bipolar type ([place]) 01/19/2019  ? Alcohol abuse   ? Auditory hallucinations   ? Alcohol abuse with alcohol-induced mood disorder ([place]) 09/08/2016  ? Homelessness 09/03/2016  ? Person feigning illness 09/03/2016  ? Brief psychotic disorder ([place]) 08/29/2016  ? Cannabis use disorder, mild, abuse 08/29/2016  ? Pulmonary emboli ([place]) 07/13/2016  ? Adjustment disorder with emotional disturbance 03/12/2014  ? Pulmonary embolism and infarction ([place]) 01/02/2014  ? Acute left flank pain 01/02/2014  ? Pulmonary embolism, bilateral ([place]) 01/02/2014  ? ?Home Medication(s) ?Prior to Admission medications   ?Medication Sig Start Date End Date Taking? Authorizing Provider  ?cephALEXin (KEFLEX) 500 MG capsule Take 1 capsule (500 mg total) by mouth 4 (four) times daily. 01/03/22   Horton, Alvin Critchley, DO  ?hydrocortisone cream 1 % Apply 1 application topically 2 (two) times daily as needed (For rash). 12/31/21   Maudie Flakes, MD  ?hydrOXYzine (ATARAX) 25 MG tablet Take 1 tablet (25 mg total) by mouth 3 (three) times daily as needed for anxiety or  itching. 12/23/21   Revonda Humphrey, NP  ?naproxen (NAPROSYN) 500 MG tablet Take 1 tablet (500 mg total) by mouth 2 (two) times daily. 01/03/22   Horton, Alvin Critchley, DO  ?neomycin-bacitracin-polymyxin (NEOSPORIN) ointment Apply topically 2 (two) times daily. 01/18/22   Antonietta Breach, PA-C  ?risperiDONE (RISPERDAL) 1 MG tablet Take 1 tablet (1 mg total) by mouth at bedtime. 01/18/22   Prosperi, Christian H, PA-C  ?amantadine (SYMMETREL) 100 MG capsule Take 1 capsule (100 mg total) by mouth 2 (two) times daily. ?Patient not taking: Reported on 11/04/2019 09/01/19 11/17/19  Mallie Darting, NP  ?                                                                                                                                  ?Allergies ?Patient has no known allergies. ? ?Review of Systems ?Review of Systems  ?Skin:  Positive for rash.  ?As noted in HPI ? ?Physical Exam ?Vital Signs  ?I  have reviewed the triage vital signs ?BP 132/80 (BP Location: Right Arm)   Pulse (!) 101   Temp 97.9 ?F (36.6 ?C)   Resp 18   SpO2 95%  ? ?Physical Exam ?Vitals reviewed.  ?Constitutional:   ?   General: He is not in acute distress. ?   Appearance: He is well-developed. He is not diaphoretic.  ?HENT:  ?   Head: Normocephalic and atraumatic.  ?   Right Ear: External ear normal.  ?   Left Ear: External ear normal.  ?   Nose: Nose normal.  ?   Mouth/Throat:  ?   Mouth: Mucous membranes are moist.  ?Eyes:  ?   General: No scleral icterus. ?   Conjunctiva/sclera: Conjunctivae normal.  ?Neck:  ?   Trachea: Phonation normal.  ?Cardiovascular:  ?   Rate and Rhythm: Normal rate and regular rhythm.  ?Pulmonary:  ?   Effort: Pulmonary effort is normal. No respiratory distress.  ?   Breath sounds: No stridor.  ?Abdominal:  ?   General: There is no distension.  ?   Tenderness: There is no abdominal tenderness.  ?   Comments: Several excoriated superficial abrasion to abd wall. No superimposed infection.  ?Musculoskeletal:     ?   General: Normal range of  motion.  ?   Cervical back: Normal range of motion.  ?Neurological:  ?   Mental Status: He is alert and oriented to person, place, and time.  ?Psychiatric:     ?   Behavior: Behavior normal.  ? ? ?ED Results and Treatments ?Labs ?(all labs ordered are listed, but only abnormal results are displayed) ?Labs Reviewed - No data to display                                                                                                                       ?EKG ? EKG Interpretation ? ?Date/Time:    ?Ventricular Rate:    ?PR Interval:    ?QRS Duration:   ?QT Interval:    ?QTC Calculation:   ?R Axis:     ?Text Interpretation:   ?  ? ?  ? ?Radiology ?No results found. ? ?Pertinent labs & imaging results that were available during my care of the patient were reviewed by me and considered in my medical decision making (see MDM for details). ? ?Medications Ordered in ED ?Medications  ?risperiDONE (RISPERDAL) tablet 1 mg (has no administration in time range)  ?neomycin-bacitracin-polymyxin (NEOSPORIN) ointment (has no administration in time range)  ?                                                               ?                                                                    ?  Procedures ?Procedures ? ?(including critical care time) ? ?Medical Decision Making / ED Course ? ? ? Complexity of Problem: ? ?Co-morbidities/SDOH that complicate the patient evaluation/care: ?Schizophrenia, homelessness ? ?Additional history obtained: ?None ? ?Patient's presenting problem/concern and DDX listed below: ?Rash ?Appears to be excoriated abrasions without supra postinfection. ? ? ?  Complexity of Data: ?  ?Cardiac Monitoring: ?No ? ?Laboratory Tests ordered listed below with my independent interpretation: ?None ?  ?Imaging Studies ordered listed below with my independent interpretation: ?None ?  ?  ?ED Course:   ? ?Hospitalization Considered:  ?no ? ?Assessment, Intervention, and Reassessment: ?Abd wall abrasions w/o superimposed  infection. ?Neosporin provided  ?Needs Risperdal dose - given ? ?Final Clinical Impression(s) / ED Diagnoses ?Final diagnoses:  ?Abrasion of abdominal wall, initial encounter  ? ?The patient appears reasonably screened and/or stabilized for discharge and I doubt any other medical condition or other [facility] requiring further screening, evaluation, or treatment in the ED at this time prior to discharge. Safe for discharge with strict return precautions. ? ?Disposition: Discharge ? ?Condition: Good ? ?I have discussed the results, Dx and Tx plan with the patient/family who expressed understanding and agree(s) with the plan. Discharge instructions discussed at length. The patient/family was given strict return precautions who verbalized understanding of the instructions. No further questions at time of discharge.  ? ? ?ED Discharge Orders   ? ? None  ? ?  ? ? ?Follow Up: ?Primary care provider ? ?Call  ?to schedule an appointment for close follow up ? ? ? ?  ? ? ? ? ? ?This chart was dictated using voice recognition software.  Despite best efforts to proofread,  errors can occur which can change the documentation meaning. ? ?  ?Fatima Blank, MD ?02/05/22 [phone] ? ?

## 2022-02-05 NOTE — ED Triage Notes (Signed)
Pt states that he is out of his hydrocortisone for his rash   ?

## 2022-02-05 NOTE — Discharge Instructions (Addendum)
It is too soon for your dose of resperdal.  You received a dose of Tylenol at your request.  When you get home tonight take your scheduled resperdal.  ?

## 2022-02-05 NOTE — ED Provider Notes (Signed)
?[name] ?Provider Note ? ? ?CSN: [id number] ?Arrival date & time: 02/05/22  0809 ? ?  ? ?History ? ?Chief Complaint  ?Patient presents with  ? wants medication   ? ? ?Cole Barrett is a 36 y.o. male. ? ?36 year old male with past medical history of schizophrenia presents today for administration of his home medication.  Patient was just discharged.  Patient left the emergency room around 4 AM and states that at that time he took his medication.  He states his ride will not be able to take him home until tonight after they get off of work so he returned to get his morning medication as well.  He does have medication at home that he will be able to take.  He denies SI, HI, AVH. ? ?The history is provided by the patient. No language interpreter was used.  ? ?  ? ?Home Medications ?Prior to Admission medications   ?Medication Sig Start Date End Date Taking? Authorizing Provider  ?cephALEXin (KEFLEX) 500 MG capsule Take 1 capsule (500 mg total) by mouth 4 (four) times daily. 01/03/22   Horton, Clabe Seal, DO  ?hydrocortisone cream 1 % Apply 1 application topically 2 (two) times daily as needed (For rash). 12/31/21   Sabas Sous, MD  ?hydrOXYzine (ATARAX) 25 MG tablet Take 1 tablet (25 mg total) by mouth 3 (three) times daily as needed for anxiety or itching. 12/23/21   Ardis Hughs, NP  ?naproxen (NAPROSYN) 500 MG tablet Take 1 tablet (500 mg total) by mouth 2 (two) times daily. 01/03/22   Horton, Clabe Seal, DO  ?neomycin-bacitracin-polymyxin (NEOSPORIN) ointment Apply topically 2 (two) times daily. 01/18/22   [name], PA-C  ?risperiDONE (RISPERDAL) 1 MG tablet Take 1 tablet (1 mg total) by mouth at bedtime. 01/18/22   Prosperi, Christian H, PA-C  ?amantadine (SYMMETREL) 100 MG capsule Take 1 capsule (100 mg total) by mouth 2 (two) times daily. ?Patient not taking: Reported on 11/04/2019 09/01/19 11/17/19  [name], NP  ?   ? ?Allergies    ?Patient has no  known allergies.   ? ?Review of Systems   ?Review of Systems  ?Neurological:  Negative for headaches.  ?Psychiatric/Behavioral:  Negative for agitation, behavioral problems, confusion, hallucinations, self-injury and suicidal ideas.   ?All other systems reviewed and are negative. ? ?Physical Exam ?Updated Vital Signs ?BP 125/76 (BP Location: Left Arm)   Pulse 69   Temp 98 ?F (36.7 ?C) (Oral)   Resp 18   Ht 5\' 10"  (1.778 m)   Wt 95.2 kg   SpO2 96%   BMI 30.11 kg/m?  ?Physical Exam ?Vitals and nursing note reviewed.  ?Constitutional:   ?   General: He is not in acute distress. ?   Appearance: Normal appearance. He is not ill-appearing.  ?HENT:  ?   Head: Normocephalic and atraumatic.  ?   Nose: Nose normal.  ?Eyes:  ?   Conjunctiva/sclera: Conjunctivae normal.  ?Pulmonary:  ?   Effort: Pulmonary effort is normal. No respiratory distress.  ?Musculoskeletal:     ?   General: No deformity.  ?Skin: ?   Findings: No rash.  ?Neurological:  ?   General: No focal deficit present.  ?   Mental Status: He is alert and oriented to person, place, and time.  ?Psychiatric:     ?   Attention and Perception: He does not perceive auditory or visual hallucinations.     ?   Mood  and Affect: Mood is not anxious. Affect is not blunt or angry.     ?   Behavior: Behavior is not agitated or aggressive.  ? ? ?ED Results / Procedures / Treatments   ?Labs ?(all labs ordered are listed, but only abnormal results are displayed) ?Labs Reviewed - No data to display ? ?EKG ?None ? ?Radiology ?No results found. ? ?Procedures ?Procedures  ? ? ?Medications Ordered in ED ?Medications  ?acetaminophen (TYLENOL) tablet 650 mg (has no administration in time range)  ? ? ?ED Course/ Medical Decision Making/ A&P ?  ?                        ?Medical Decision Making ?Risk ?OTC drugs. ? ? ?36 year old male presents today for request of his home dose of resperdal.  Patient was just discharged and left the emergency room at 4 AM at which time he took his  medication as well.  He returns because he will be unable to make it to his house until tonight he can take his home medication.  Had a discussion with patient that it has only been 4 hours since his last dose of medication and that it is too soon for his next dose.  Patient is reluctant to accept this and request at least a dose of Tylenol instead.  He denies headache or other complaints.  Will give dose of Tylenol.  He is without SI, HI, AVH.  Patient is appropriate for discharge.  Discharged in stable condition. ? ? ?Final Clinical Impression(s) / ED Diagnoses ?Final diagnoses:  ?Medication management  ? ? ?Rx / DC Orders ?ED Discharge Orders   ? ? None  ? ?  ? ? ?  ?Marita [place], PA-C ?02/05/22 4481 ? ?  ?Milagros Loll, MD ?02/06/22 [phone] ? ?

## 2022-02-05 NOTE — ED Triage Notes (Signed)
Pt was just discharged at 4am this morning and states his ride would not pick him up and take him down the street until he got his morning medicine. Pt states he has medication at home but will not be going by his house until tonight.  ?

## 2022-02-07 ENCOUNTER — Encounter (HOSPITAL_COMMUNITY): Payer: Self-pay

## 2022-02-07 ENCOUNTER — Emergency Department (HOSPITAL_COMMUNITY)
Admission: EM | Admit: 2022-02-07 | Discharge: 2022-02-08 | Discharge status: Against Medical Advice | Payer: Self-pay | Attending: Emergency Medicine | Admitting: Emergency Medicine

## 2022-02-07 ENCOUNTER — Other Ambulatory Visit: Payer: Self-pay

## 2022-02-07 DIAGNOSIS — Z760 Encounter for issue of repeat prescription: Secondary | ICD-10-CM | POA: Insufficient documentation

## 2022-02-07 DIAGNOSIS — Z5321 Procedure and treatment not carried out due to patient leaving prior to being seen by health care provider: Secondary | ICD-10-CM | POA: Insufficient documentation

## 2022-02-07 DIAGNOSIS — R21 Rash and other nonspecific skin eruption: Secondary | ICD-10-CM | POA: Insufficient documentation

## 2022-02-07 NOTE — ED Triage Notes (Signed)
Pt states that he needs his Risperidone. Pt also reports a rash to his right arm.  ?

## 2022-02-08 NOTE — ED Notes (Signed)
Pt states that his ride is outside and he is leaving.  ?

## 2022-02-24 ENCOUNTER — Encounter (HOSPITAL_COMMUNITY): Payer: Self-pay | Admitting: *Deleted

## 2022-02-24 ENCOUNTER — Emergency Department (HOSPITAL_COMMUNITY)
Admission: EM | Admit: 2022-02-24 | Discharge: 2022-02-24 | Disposition: A | Discharge status: Home / Self Care | Payer: Self-pay | Attending: Emergency Medicine | Admitting: Emergency Medicine

## 2022-02-24 ENCOUNTER — Other Ambulatory Visit: Payer: Self-pay

## 2022-02-24 DIAGNOSIS — Z760 Encounter for issue of repeat prescription: Secondary | ICD-10-CM | POA: Insufficient documentation

## 2022-02-24 DIAGNOSIS — Z79899 Other long term (current) drug therapy: Secondary | ICD-10-CM | POA: Insufficient documentation

## 2022-02-24 MED ORDER — RISPERIDONE 1 MG PO TABS
1.0000 mg | ORAL_TABLET | Freq: Once | ORAL | Status: AC
  Administered 2022-02-24: 1 mg via ORAL
  Filled 2022-02-24: qty 1

## 2022-02-24 NOTE — Discharge Instructions (Signed)
Please make sure to carry your medications with you when you are away from home or traveling. ?

## 2022-02-24 NOTE — ED Provider Notes (Signed)
?[name] EMERGENCY DEPARTMENT ?Provider Note ? ? ?CSN: [id number] ?Arrival date & time: 02/24/22  0610 ? ?  ? ?History ?Chief Complaint  ?Patient presents with  ? Medication Refill  ? ? ?Cole Barrett is a 36 y.o. male with past medical history of schizophrenia presents today for administration of his home medication. The patient reports he did not has a dose of his Risperdal last night and would like a dose. The patient is well known to the ED with 91 visits over the past 6 months, the majority of them for a dose of his medication. The patient reports he does have medication at home, but it[place] the [place] of [place]. He denies any SI, HI, or AVH. ? ? ?Medication Refill ? ?  ? ?Home Medications ?Prior to Admission medications   ?Medication Sig Start Date End Date Taking? Authorizing Provider  ?cephALEXin (KEFLEX) 500 MG capsule Take 1 capsule (500 mg total) by mouth 4 (four) times daily. 01/03/22   Horton, Clabe Seal, DO  ?hydrocortisone cream 1 % Apply 1 application topically 2 (two) times daily as needed (For rash). 12/31/21   Sabas Sous, MD  ?hydrOXYzine (ATARAX) 25 MG tablet Take 1 tablet (25 mg total) by mouth 3 (three) times daily as needed for anxiety or itching. 12/23/21   Ardis Hughs, NP  ?naproxen (NAPROSYN) 500 MG tablet Take 1 tablet (500 mg total) by mouth 2 (two) times daily. 01/03/22   Horton, Clabe Seal, DO  ?neomycin-bacitracin-polymyxin (NEOSPORIN) ointment Apply topically 2 (two) times daily. 01/18/22   [name], PA-C  ?risperiDONE (RISPERDAL) 1 MG tablet Take 1 tablet (1 mg total) by mouth at bedtime. 01/18/22   Prosperi, Christian H, PA-C  ?amantadine (SYMMETREL) 100 MG capsule Take 1 capsule (100 mg total) by mouth 2 (two) times daily. ?Patient not taking: Reported on 11/04/2019 09/01/19 11/17/19  [name], NP  ?   ? ?Allergies    ?Patient has no known allergies.   ? ?Review of Systems   ?Review of Systems  ?Psychiatric/Behavioral:  Negative for  hallucinations and suicidal ideas.   ? ?Physical Exam ?Updated Vital Signs ?BP 124/86 (BP Location: Left Arm)   Pulse 74   Temp 98.2 ?F (36.8 ?C)   Resp 16   SpO2 97%  ?Physical Exam ?Vitals and nursing note reviewed.  ?Constitutional:   ?   Appearance: Normal appearance.  ?Eyes:  ?   General: No scleral icterus. ?Pulmonary:  ?   Effort: Pulmonary effort is normal. No respiratory distress.  ?Skin: ?   General: Skin is dry.  ?   Findings: No rash.  ?Neurological:  ?   General: No focal deficit present.  ?   Mental Status: He is alert. Mental status is at baseline.  ?Psychiatric:     ?   Mood and Affect: Mood normal.  ? ? ?ED Results / Procedures / Treatments   ?Labs ?(all labs ordered are listed, but only abnormal results are displayed) ?Labs Reviewed - No data to display ? ?EKG ?None ? ?Radiology ?No results found. ? ?Procedures ?Procedures  ? ?Medications Ordered in ED ?Medications  ?risperiDONE (RISPERDAL) tablet 1 mg (has no administration in time range)  ? ? ?ED Course/ Medical Decision Making/ A&P ?  ?                        ?Medical Decision Making ? ?36 y/o M presents to the ED for daily  dose of his medication. The patient denies any HI/SI/AVH. He reports that he does have medication at home, it is just not on this side of town. He reports he has been compliant with his medication otherwise.  ? ?His daily Risperidal dose ordered. I instructed the patient to make sure he carries his medication with him at all times to make sure he is able to take his daily dose, regardless of if he is traveling or not. Return precautions given. The patient agrees to plan. The patient is stable and being discharged home in good condition.  ? ?Final Clinical Impression(s) / ED Diagnoses ?Final diagnoses:  ?Medication management  ? ? ?Rx / DC Orders ?ED Discharge Orders   ? ? None  ? ?  ? ? ?  ?Achille Rich, PA-C ?02/24/22 0093 ? ?  ?Milagros Loll, MD ?02/24/22 [phone] ? ?

## 2022-02-24 NOTE — ED Triage Notes (Signed)
Pt requesting Risperidone. Denies SI/HI/hallucinations ?

## 2022-03-01 ENCOUNTER — Other Ambulatory Visit: Payer: Self-pay

## 2022-03-01 ENCOUNTER — Emergency Department (HOSPITAL_COMMUNITY)
Admission: EM | Admit: 2022-03-01 | Discharge: 2022-03-01 | Disposition: A | Discharge status: Home / Self Care | Payer: Self-pay | Attending: Emergency Medicine | Admitting: Emergency Medicine

## 2022-03-01 ENCOUNTER — Encounter (HOSPITAL_COMMUNITY): Payer: Self-pay

## 2022-03-01 DIAGNOSIS — Z760 Encounter for issue of repeat prescription: Secondary | ICD-10-CM | POA: Insufficient documentation

## 2022-03-01 MED ORDER — RISPERIDONE 1 MG PO TABS
1.0000 mg | ORAL_TABLET | Freq: Once | ORAL | Status: AC
  Administered 2022-03-01: 1 mg via ORAL
  Filled 2022-03-01: qty 1

## 2022-03-01 NOTE — ED Triage Notes (Signed)
Pt reports he just need his nightly dose of risperidone.Pt report he has the medication [place] the other side of town but he reported he si not going there tonight  ?

## 2022-03-01 NOTE — ED Provider Notes (Signed)
?[facility] ?Provider Note ? ? ?CSN: [id number] ?Arrival date & time: 03/01/22  0413 ? ?  ? ?History ? ?Chief Complaint  ?Patient presents with  ? Medication Refill  ? ? ?Kycen Rabara is a 36 y.o. male. ? ?The history is provided by the patient and medical records.  ?Medication Refill ? ?36 y.o. M here for nightly Risperdal dose.  States his medication is at the pharmacy [place] of [place].  Denies SI/HI. ? ?Home Medications ?Prior to Admission medications   ?Medication Sig Start Date End Date Taking? Authorizing Provider  ?cephALEXin (KEFLEX) 500 MG capsule Take 1 capsule (500 mg total) by mouth 4 (four) times daily. 01/03/22   Horton, Alvin Critchley, DO  ?hydrocortisone cream 1 % Apply 1 application topically 2 (two) times daily as needed (For rash). 12/31/21   Maudie Flakes, MD  ?hydrOXYzine (ATARAX) 25 MG tablet Take 1 tablet (25 mg total) by mouth 3 (three) times daily as needed for anxiety or itching. 12/23/21   Revonda Humphrey, NP  ?naproxen (NAPROSYN) 500 MG tablet Take 1 tablet (500 mg total) by mouth 2 (two) times daily. 01/03/22   Horton, Alvin Critchley, DO  ?neomycin-bacitracin-polymyxin (NEOSPORIN) ointment Apply topically 2 (two) times daily. 01/18/22   Antonietta Breach, PA-C  ?risperiDONE (RISPERDAL) 1 MG tablet Take 1 tablet (1 mg total) by mouth at bedtime. 01/18/22   Prosperi, Christian H, PA-C  ?amantadine (SYMMETREL) 100 MG capsule Take 1 capsule (100 mg total) by mouth 2 (two) times daily. ?Patient not taking: Reported on 11/04/2019 09/01/19 11/17/19  Mallie Darting, NP  ?   ? ?Allergies    ?Patient has no known allergies.   ? ?Review of Systems   ?Review of Systems  ?Psychiatric/Behavioral:    ?     Medication refill  ?All other systems reviewed and are negative. ? ?Physical Exam ?Updated Vital Signs ?BP (!) 150/81 (BP Location: Right Arm)   Pulse 89   Temp 98.4 ?F (36.9 ?C) (Oral)   Resp 17   SpO2 97%  ? ?Physical Exam ?Vitals and nursing note  reviewed.  ?Constitutional:   ?   Appearance: He is well-developed.  ?HENT:  ?   Head: Normocephalic and atraumatic.  ?Eyes:  ?   Conjunctiva/sclera: Conjunctivae normal.  ?   Pupils: Pupils are equal, round, and reactive to light.  ?Cardiovascular:  ?   Rate and Rhythm: Normal rate and regular rhythm.  ?   Heart sounds: Normal heart sounds.  ?Pulmonary:  ?   Effort: Pulmonary effort is normal.  ?   Breath sounds: Normal breath sounds.  ?Abdominal:  ?   General: Bowel sounds are normal.  ?   Palpations: Abdomen is soft.  ?Musculoskeletal:     ?   General: Normal range of motion.  ?   Cervical back: Normal range of motion.  ?Skin: ?   General: Skin is warm and dry.  ?Neurological:  ?   Mental Status: He is alert and oriented to person, place, and time.  ?Psychiatric:  ?   Comments: Denies SI/HI  ? ? ?ED Results / Procedures / Treatments   ?Labs ?(all labs ordered are listed, but only abnormal results are displayed) ?Labs Reviewed - No data to display ? ?EKG ?None ? ?Radiology ?No results found. ? ?Procedures ?Procedures  ? ? ?Medications Ordered in ED ?Medications  ?risperiDONE (RISPERDAL) tablet 1 mg (has no administration in time range)  ? ? ?ED  Course/ Medical Decision Making/ A&P ?  ?                        ?Medical Decision Making ?Risk ?Prescription drug management. ? ? ?36 year old male here requesting his dose of Risperdal.  Takes this nightly, usually seen in the ER for this.  States he has refill at the pharmacy but is [place] the other side of town.  Denies any SI/HI.  Given dose of meds, stable for discharge. ? ?Final Clinical Impression(s) / ED Diagnoses ?Final diagnoses:  ?Medication refill  ? ? ?Rx / DC Orders ?ED Discharge Orders   ? ? None  ? ?  ? ? ?  ?Larene Pickett, PA-C ?03/01/22 [phone] ? ?  ?Merryl Hacker, MD ?03/01/22 [phone] ? ?

## 2022-03-05 ENCOUNTER — Emergency Department (HOSPITAL_COMMUNITY)
Admission: EM | Admit: 2022-03-05 | Discharge: 2022-03-05 | Disposition: A | Discharge status: Home / Self Care | Payer: Self-pay | Attending: Emergency Medicine | Admitting: Emergency Medicine

## 2022-03-05 ENCOUNTER — Encounter (HOSPITAL_COMMUNITY): Payer: Self-pay

## 2022-03-05 ENCOUNTER — Other Ambulatory Visit: Payer: Self-pay

## 2022-03-05 DIAGNOSIS — R21 Rash and other nonspecific skin eruption: Secondary | ICD-10-CM | POA: Insufficient documentation

## 2022-03-05 DIAGNOSIS — Z760 Encounter for issue of repeat prescription: Secondary | ICD-10-CM | POA: Insufficient documentation

## 2022-03-05 MED ORDER — BACITRACIN ZINC 500 UNIT/GM EX OINT
TOPICAL_OINTMENT | Freq: Once | CUTANEOUS | Status: AC
  Administered 2022-03-05: 1 via TOPICAL
  Filled 2022-03-05: qty 0.9

## 2022-03-05 MED ORDER — RISPERIDONE 1 MG PO TABS
1.0000 mg | ORAL_TABLET | Freq: Once | ORAL | Status: AC
  Administered 2022-03-05: 1 mg via ORAL
  Filled 2022-03-05: qty 1

## 2022-03-05 MED ORDER — HYDROCORTISONE 1 % EX CREA: 1.0000 "application " | TOPICAL_CREAM | Freq: Two times a day (BID) | CUTANEOUS | 0 refills | Status: DC | PRN

## 2022-03-05 NOTE — ED Notes (Signed)
Pt sleeping without s/s of distress when provided with d/c instructions. Pt wakes easily and verbalizes understanding instructions. Ambulatory up on leaving ?

## 2022-03-05 NOTE — ED Provider Notes (Signed)
?[facility]-EMERGENCY DEPT ?Provider Note ? ? ?CSN: [id number] ?Arrival date & time: 03/05/22  4008 ? ?  ? ?History ? ?Chief Complaint  ?Patient presents with  ? Medication Refill  ? ? ?Elena Cothern is a 36 y.o. male. ? ?Patient with history of schizophrenia presents the emergency department requesting a dose of Risperdal.  Patient has been seen in the emergency department multiple times for the same.  In addition, patient has a "rash" to his lower abdomen that is developed over the past couple of weeks.  He states that the area is somewhat itchy.  He is requesting a medication for this.  Of note, patient has had similar rashes, per his chart, and has been prescribed hydrocortisone, bacitracin in past.  Denies fever, nausea, vomiting.  He is requesting something to eat and drink. ? ? ?  ? ?Home Medications ?Prior to Admission medications   ?Medication Sig Start Date End Date Taking? Authorizing Provider  ?cephALEXin (KEFLEX) 500 MG capsule Take 1 capsule (500 mg total) by mouth 4 (four) times daily. 01/03/22   Horton, Clabe Seal, DO  ?hydrocortisone cream 1 % Apply 1 application topically 2 (two) times daily as needed (For rash). 12/31/21   Sabas Sous, MD  ?hydrOXYzine (ATARAX) 25 MG tablet Take 1 tablet (25 mg total) by mouth 3 (three) times daily as needed for anxiety or itching. 12/23/21   Ardis Hughs, NP  ?naproxen (NAPROSYN) 500 MG tablet Take 1 tablet (500 mg total) by mouth 2 (two) times daily. 01/03/22   Horton, Clabe Seal, DO  ?neomycin-bacitracin-polymyxin (NEOSPORIN) ointment Apply topically 2 (two) times daily. 01/18/22   [name], PA-C  ?risperiDONE (RISPERDAL) 1 MG tablet Take 1 tablet (1 mg total) by mouth at bedtime. 01/18/22   Prosperi, Christian H, PA-C  ?amantadine (SYMMETREL) 100 MG capsule Take 1 capsule (100 mg total) by mouth 2 (two) times daily. ?Patient not taking: Reported on 11/04/2019 09/01/19 11/17/19  [name], NP  ?   ? ?Allergies    ?Patient has  no known allergies.   ? ?Review of Systems   ?Review of Systems ? ?Physical Exam ?Updated Vital Signs ?BP 135/87 (BP Location: Left Arm)   Pulse 70   Temp 98 ?F (36.7 ?C) (Oral)   Resp 16   SpO2 100%  ? ?Physical Exam ?Vitals and nursing note reviewed.  ?Constitutional:   ?   General: He is not in acute distress. ?   Appearance: He is well-developed.  ?HENT:  ?   Head: Normocephalic and atraumatic.  ?Eyes:  ?   General:     ?   Right eye: No discharge.     ?   Left eye: No discharge.  ?   Conjunctiva/sclera: Conjunctivae normal.  ?Cardiovascular:  ?   Rate and Rhythm: Normal rate and regular rhythm.  ?   Heart sounds: Normal heart sounds.  ?Pulmonary:  ?   Effort: Pulmonary effort is normal.  ?   Breath sounds: Normal breath sounds.  ?Abdominal:  ?   Palpations: Abdomen is soft.  ?   Tenderness: There is no abdominal tenderness.  ?Musculoskeletal:  ?   Cervical back: Normal range of motion and neck supple.  ?Skin: ?   General: Skin is warm and dry.  ?   Comments: Patient with several scattered erosions, circular in shape, to the lower abdomen.  No surrounding cellulitis or drainage from these areas.  ?Neurological:  ?   Mental Status: He is  alert.  ?Psychiatric:     ?   Mood and Affect: Affect is blunt.  ? ? ?ED Results / Procedures / Treatments   ?Labs ?(all labs ordered are listed, but only abnormal results are displayed) ?Labs Reviewed - No data to display ? ?EKG ?None ? ?Radiology ?No results found. ? ?Procedures ?Procedures  ? ? ?Medications Ordered in ED ?Medications  ?risperiDONE (RISPERDAL) tablet 1 mg (1 mg Oral Given 03/05/22 4765)  ?bacitracin ointment (1 application. Topical Given 03/05/22 0652)  ? ? ?ED Course/ Medical Decision Making/ A&P ?  ? ?Patient seen and examined. History obtained directly from patient and previous ED visits, specifically in regards to the rash.  ? ?Labs/EKG: None ordered ? ?Imaging: None ordered ? ?Medications/Fluids: Ordered: Risperdal, 1 mg which is the patient's chronic  dose.  We will give prescription for hydrocortisone 1% and apply bacitracin here. ? ?Most recent vital signs reviewed and are as follows: ?BP 135/87 (BP Location: Left Arm)   Pulse 70   Temp 98 ?F (36.7 ?C) (Oral)   Resp 16   SpO2 100%  ? ?Initial impression: Medication needs, rash ? ?Home treatment plan: Wound care, hydrocortisone topical ? ?Return instructions discussed with patient: Return with worsening redness, pain, swelling, drainage. ? ?Follow-up instructions discussed with patient: Follow-up with PCP in the next 1 week for recheck ? ?                        ?Medical Decision Making ?Risk ?OTC drugs. ?Prescription drug management. ? ? ?Medication needs: Patient frequently seen in the emergency department for dose of Risperdal.  From the standpoint schizophrenia, patient does not appear to be hallucinating and appears to be compensated today. ? ?Rash: Similar to previous, but on the lower abdomen.  No signs of cellulitis, abscess, drainage, other signs of infection.  No systemic signs of infection.  Unclear etiology.  Low concern for SJS, TEN, anaphylaxis. ? ? ? ? ? ? ? ?Final Clinical Impression(s) / ED Diagnoses ?Final diagnoses:  ?Rash  ?Medication refill  ? ? ?Rx / DC Orders ?ED Discharge Orders   ? ? None  ? ?  ? ? ?  ?Renne Crigler, PA-C ?03/05/22 4650 ? ?  ?Linwood Dibbles, MD ?03/06/22 [phone] ? ?

## 2022-03-05 NOTE — ED Triage Notes (Signed)
Patient said he needs a medication refill for risperidone and cream for his rash. ?

## 2022-03-06 ENCOUNTER — Emergency Department (HOSPITAL_COMMUNITY)
Admission: EM | Admit: 2022-03-06 | Discharge: 2022-03-06 | Disposition: A | Discharge status: Home / Self Care | Payer: Self-pay | Attending: Emergency Medicine | Admitting: Emergency Medicine

## 2022-03-06 ENCOUNTER — Other Ambulatory Visit: Payer: Self-pay

## 2022-03-06 ENCOUNTER — Encounter (HOSPITAL_COMMUNITY): Payer: Self-pay | Admitting: Emergency Medicine

## 2022-03-06 DIAGNOSIS — Z79899 Other long term (current) drug therapy: Secondary | ICD-10-CM

## 2022-03-06 DIAGNOSIS — Z760 Encounter for issue of repeat prescription: Secondary | ICD-10-CM | POA: Insufficient documentation

## 2022-03-06 MED ORDER — RISPERIDONE 1 MG PO TABS
1.0000 mg | ORAL_TABLET | Freq: Once | ORAL | Status: AC
  Administered 2022-03-06: 1 mg via ORAL
  Filled 2022-03-06 (×2): qty 1

## 2022-03-06 NOTE — ED Provider Notes (Signed)
?[name] EMERGENCY DEPARTMENT ?Provider Note ? ? ?CSN: [id number] ?Arrival date & time: 03/06/22  3664 ? ?  ? ?History ? ?Chief Complaint  ?Patient presents with  ? Medication Refill  ? ? ?Cole Barrett is a 36 y.o. male. ? ?Patient with frequent emergency department visits, presents requesting his dose of risperidone.  He denies any other complaints.  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  Patient most recently seen yesterday by myself at [facility] emergency department. ? ? ?  ? ?Home Medications ?Prior to Admission medications   ?Medication Sig Start Date End Date Taking? Authorizing Provider  ?cephALEXin (KEFLEX) 500 MG capsule Take 1 capsule (500 mg total) by mouth 4 (four) times daily. 01/03/22   Horton, Clabe Seal, DO  ?hydrocortisone cream 1 % Apply 1 application. topically 2 (two) times daily as needed (For rash). 03/05/22   Renne Crigler, PA-C  ?hydrOXYzine (ATARAX) 25 MG tablet Take 1 tablet (25 mg total) by mouth 3 (three) times daily as needed for anxiety or itching. 12/23/21   Ardis Hughs, NP  ?naproxen (NAPROSYN) 500 MG tablet Take 1 tablet (500 mg total) by mouth 2 (two) times daily. 01/03/22   Horton, Clabe Seal, DO  ?neomycin-bacitracin-polymyxin (NEOSPORIN) ointment Apply topically 2 (two) times daily. 01/18/22   [name], PA-C  ?risperiDONE (RISPERDAL) 1 MG tablet Take 1 tablet (1 mg total) by mouth at bedtime. 01/18/22   Prosperi, Christian H, PA-C  ?amantadine (SYMMETREL) 100 MG capsule Take 1 capsule (100 mg total) by mouth 2 (two) times daily. ?Patient not taking: Reported on 11/04/2019 09/01/19 11/17/19  [name], NP  ?   ? ?Allergies    ?Patient has no known allergies.   ? ?Review of Systems   ?Review of Systems ? ?Physical Exam ?Updated Vital Signs ?BP 140/84 (BP Location: Right Arm)   Pulse 100   Temp 97.9 ?F (36.6 ?C) (Oral)   Resp 18   Ht 5\' 10"  (1.778 m)   Wt  95.2 kg   SpO2 93%   BMI 30.11 kg/m?  ?Physical Exam ?Vitals and nursing note reviewed.  ?Constitutional:   ?   Appearance: He is well-developed.  ?HENT:  ?   Head: Normocephalic and atraumatic.  ?Eyes:  ?   Conjunctiva/sclera: Conjunctivae normal.  ?Pulmonary:  ?   Effort: No respiratory distress.  ?Musculoskeletal:  ?   Cervical back: Normal range of motion and neck supple.  ?Skin: ?   General: Skin is warm and dry.  ?Neurological:  ?   Mental Status: He is alert.  ?Psychiatric:     ?   Attention and Perception: He is attentive. He does not perceive auditory or visual hallucinations.     ?   Mood and Affect: Mood normal.     ?   Speech: Speech normal.     ?   Behavior: Behavior normal.  ? ? ?ED Results / Procedures / Treatments   ?Labs ?(all labs ordered are listed, but only abnormal results are displayed) ?Labs Reviewed - No data to display ? ?EKG ?None ? ?Radiology ?No results found. ? ?Procedures ?Procedures  ? ? ?Medications Ordered in ED ?Medications  ?risperiDONE (RISPERDAL) tablet 1 mg (has no administration in time range)  ? ? ?ED Course/ Medical Decision Making/ A&P ?  ? ?Patient seen and examined. History obtained directly from patient. Work-up including labs, imaging, EKG  ordered in triage, if performed, were reviewed.   ? ?Medications/Fluids: Ordered: Risperdal ? ?Most recent vital signs reviewed and are as follows: ?BP 140/84 (BP Location: Right Arm)   Pulse 100   Temp 97.9 ?F (36.6 ?C) (Oral)   Resp 18   Ht 5\' 10"  (1.778 m)   Wt 95.2 kg   SpO2 93%   BMI 30.11 kg/m?  ? ?Initial impression: Need for medications ? ?Follow-up instructions discussed with patient: He was encouraged to pick up his medications and take as prescribed, follow-up with his psychiatric provider soon as possible. ? ?                        ?Medical Decision Making ?Risk ?Prescription drug management. ? ? ?Stable patient here for his medication.  Essentially no changes in exam since yesterday in regards to history of  schizophrenia.  Seems well compensated at this time. ? ?Social determinants of health affecting patient's care include poor access to health care, poor medication compliance. ? ? ? ? ? ? ? ?Final Clinical Impression(s) / ED Diagnoses ?Final diagnoses:  ?Medication management  ? ? ?Rx / DC Orders ?ED Discharge Orders   ? ? None  ? ?  ? ? ?  ? , PA-C ?03/06/22 05/06/22 ? ?  ?[id number], MD ?03/06/22 0801 ? ?

## 2022-03-06 NOTE — ED Triage Notes (Signed)
Pt states that he needs his risperidone ?

## 2022-03-08 ENCOUNTER — Encounter (HOSPITAL_COMMUNITY): Payer: Self-pay | Admitting: *Deleted

## 2022-03-08 ENCOUNTER — Other Ambulatory Visit: Payer: Self-pay

## 2022-03-08 ENCOUNTER — Emergency Department (HOSPITAL_COMMUNITY)
Admission: EM | Admit: 2022-03-08 | Discharge: 2022-03-08 | Disposition: A | Discharge status: Home / Self Care | Payer: Self-pay | Attending: Emergency Medicine | Admitting: Emergency Medicine

## 2022-03-08 DIAGNOSIS — R21 Rash and other nonspecific skin eruption: Secondary | ICD-10-CM | POA: Insufficient documentation

## 2022-03-08 DIAGNOSIS — Z760 Encounter for issue of repeat prescription: Secondary | ICD-10-CM | POA: Insufficient documentation

## 2022-03-08 MED ORDER — BACITRACIN ZINC 500 UNIT/GM EX OINT: 1.0000 "application " | TOPICAL_OINTMENT | Freq: Two times a day (BID) | CUTANEOUS | 0 refills | Status: DC

## 2022-03-08 MED ORDER — RISPERIDONE 1 MG PO TABS
1.0000 mg | ORAL_TABLET | Freq: Once | ORAL | Status: AC
  Administered 2022-03-08: 1 mg via ORAL
  Filled 2022-03-08: qty 1

## 2022-03-08 MED ORDER — BACITRACIN ZINC 500 UNIT/GM EX OINT
TOPICAL_OINTMENT | Freq: Two times a day (BID) | CUTANEOUS | Status: DC
  Administered 2022-03-08: 31.5 via TOPICAL
  Filled 2022-03-08: qty 28.4

## 2022-03-08 NOTE — Discharge Instructions (Addendum)
Sent prescription for ointment to pharmacy ?

## 2022-03-08 NOTE — ED Provider Notes (Signed)
?[name] EMERGENCY DEPARTMENT ?Provider Note ? ? ?CSN: [id number] ?Arrival date & time: 03/08/22  0438 ? ?  ? ?History ? ?Med refill ? ? ?Cole Barrett is a 36 y.o. male well-known here to the emergency department as well as myself here for evaluation of 2 separate complaints. ? ?History of schizophrenia.  Has not been able to fill his Risperdol.  Here for his nightly dose.  Of note he is seen here most daily for his medication.  Denies SI, HI, AVH. ? ?Also with rash to his abdomen.  Chronic in nature.  No new lotions, perfumes or detergents.  Slightly pruritic in nature.  No surrounding erythema, warmth, drainage.  No fever.  No abdominal pain. ? ?HPI ? ?  ? ?Home Medications ?Prior to Admission medications   ?Medication Sig Start Date End Date Taking? Authorizing Provider  ?bacitracin ointment Apply 1 application. topically 2 (two) times daily. 03/08/22  Yes Kiani Wurtzel A, PA-C  ?cephALEXin (KEFLEX) 500 MG capsule Take 1 capsule (500 mg total) by mouth 4 (four) times daily. 01/03/22   Horton, Clabe Seal, DO  ?hydrocortisone cream 1 % Apply 1 application. topically 2 (two) times daily as needed (For rash). 03/05/22   Renne Crigler, PA-C  ?hydrOXYzine (ATARAX) 25 MG tablet Take 1 tablet (25 mg total) by mouth 3 (three) times daily as needed for anxiety or itching. 12/23/21   Ardis Hughs, NP  ?naproxen (NAPROSYN) 500 MG tablet Take 1 tablet (500 mg total) by mouth 2 (two) times daily. 01/03/22   Horton, Clabe Seal, DO  ?neomycin-bacitracin-polymyxin (NEOSPORIN) ointment Apply topically 2 (two) times daily. 01/18/22   [name], PA-C  ?risperiDONE (RISPERDAL) 1 MG tablet Take 1 tablet (1 mg total) by mouth at bedtime. 01/18/22   Prosperi, Christian H, PA-C  ?amantadine (SYMMETREL) 100 MG capsule Take 1 capsule (100 mg total) by mouth 2 (two) times daily. ?Patient not taking: Reported on 11/04/2019 09/01/19 11/17/19  [name], NP  ?   ? ?Allergies    ?Patient has no known  allergies.   ? ?Review of Systems   ?Review of Systems  ?Constitutional: Negative.   ?HENT: Negative.    ?Respiratory: Negative.    ?Cardiovascular: Negative.   ?Gastrointestinal: Negative.   ?Genitourinary: Negative.   ?Musculoskeletal: Negative.   ?Skin:  Positive for rash.  ?Neurological: Negative.   ?All other systems reviewed and are negative. ? ?Physical Exam ?Updated Vital Signs ?BP 139/90 (BP Location: Right Arm)   Pulse 93   Temp 98.6 ?F (37 ?C) (Oral)   Resp 18   SpO2 98%  ?Physical Exam ?Vitals and nursing note reviewed.  ?Constitutional:   ?   General: He is not in acute distress. ?   Appearance: He is well-developed. He is not ill-appearing, toxic-appearing or diaphoretic.  ?HENT:  ?   Head: Normocephalic and atraumatic.  ?   Mouth/Throat:  ?   Mouth: Mucous membranes are moist.  ?Eyes:  ?   Pupils: Pupils are equal, round, and reactive to light.  ?Cardiovascular:  ?   Rate and Rhythm: Normal rate and regular rhythm.  ?Pulmonary:  ?   Effort: Pulmonary effort is normal. No respiratory distress.  ?Abdominal:  ?   General: There is no distension.  ?   Palpations: Abdomen is soft.  ?   Comments: Soft, nontender.  Multiple areas of irregular circular regions without surrounding erythema, warmth, fluctuance and induration  ?Musculoskeletal:     ?   General:  Normal range of motion.  ?   Cervical back: Normal range of motion and neck supple.  ?Skin: ?   General: Skin is warm and dry.  ?   Capillary Refill: Capillary refill takes less than 2 seconds.  ?   Comments: No target lesions, bulla, vesicles.  No intraoral lesions.  No petechiae, purpura.  No fluctuance or induration.  No draining wounds  ?Neurological:  ?   General: No focal deficit present.  ?   Mental Status: He is alert and oriented to person, place, and time.  ?Psychiatric:     ?   Mood and Affect: Mood normal.     ?   Behavior: Behavior normal.  ?   Comments: Denies SI, HI, AVH ?Cooperative  ? ? ?ED Results / Procedures / Treatments    ?Labs ?(all labs ordered are listed, but only abnormal results are displayed) ?Labs Reviewed - No data to display ? ?EKG ?None ? ?Radiology ?No results found. ? ?Procedures ?Procedures  ? ? ?Medications Ordered in ED ?Medications  ?bacitracin ointment (has no administration in time range)  ?risperiDONE (RISPERDAL) tablet 1 mg (has no administration in time range)  ? ? ?ED Course/ Medical Decision Making/ A&P ?  ? ?36 year old well-known here to myself as well as the emergency department here for evaluation of med refill and his nightly dose of risperdal.  He is seen in this daily for similar.  Denies any SI, HI, AVH.  Does not appear actively psychotic.  We will give him his dose of Risperdal.  Do not feel he needs psychiatric admission or psychiatry evaluation at this time ? ?Patient also with rash to anterior abdomen.  States this has been long going.  Unchanged.  Typically uses hydrocortisone cream on this.  He has no evidence of allergic reaction.  No new lotions, perfumes.  Reviewed media tab from prior visits.  Rash appears to be longstanding, unchanged. ? ?Rash unclear etiology. Patient denies any difficulty breathing or swallowing.  Pt has a patent airway without stridor and is handling secretions without difficulty; no angioedema. No blisters, no pustules, no warmth, no draining sinus tracts, no superficial abscesses, no bullous impetigo, no vesicles, no desquamation, no target lesions with dusky purpura or a central bulla. Not tender to touch. No concern for SJS, TEN, TSS, tick borne illness, syphilis or other life-threatening condition.  Start using bacitracin on wounds, follow-up outpatient. ? ?The patient has been appropriately medically screened and/or stabilized in the ED. I have low suspicion for any other emergent medical condition which would require further screening, evaluation or treatment in the ED or require inpatient management. ? ?Patient is hemodynamically stable and in no acute distress.   Patient able to ambulate in department prior to ED.  Evaluation does not show acute pathology that would require ongoing or additional emergent interventions while in the emergency department or further inpatient treatment.  I have discussed the diagnosis with the patient and answered all questions.  Pain is been managed while in the emergency department and patient has no further complaints prior to discharge.  Patient is comfortable with plan discussed in room and is stable for discharge at this time.  I have discussed strict return precautions for returning to the emergency department.  Patient was encouraged to follow-up with PCP/specialist refer to at discharge.  ? ?                        ?Medical Decision Making ?Amount  and/or Complexity of Data Reviewed ?External Data Reviewed: labs, radiology and notes. ? ?Risk ?OTC drugs. ?Prescription drug management. ?Diagnosis or treatment significantly limited by social determinants of health. ? ? ? ? ? ? ? ? ?Final Clinical Impression(s) / ED Diagnoses ?Final diagnoses:  ?Medication refill  ?Rash  ? ? ?Rx / DC Orders ?ED Discharge Orders   ? ?      Ordered  ?  bacitracin ointment  2 times daily       ? 03/08/22 0538  ? ?  ?  ? ?  ? ? ?  ?Cloma Rahrig A, PA-C ?03/08/22 [phone] ? ?  ?Sabas SousBero, Michael M, MD ?03/08/22 [phone] ? ?

## 2022-03-08 NOTE — ED Triage Notes (Signed)
Pt requesting medication for rash on his abdomen and risperidone ?

## 2022-03-15 ENCOUNTER — Emergency Department (HOSPITAL_COMMUNITY)
Admission: EM | Admit: 2022-03-15 | Discharge: 2022-03-15 | Disposition: A | Discharge status: Home / Self Care | Payer: Self-pay | Attending: Emergency Medicine | Admitting: Emergency Medicine

## 2022-03-15 ENCOUNTER — Encounter (HOSPITAL_COMMUNITY): Payer: Self-pay

## 2022-03-15 DIAGNOSIS — Z79899 Other long term (current) drug therapy: Secondary | ICD-10-CM | POA: Insufficient documentation

## 2022-03-15 DIAGNOSIS — Z760 Encounter for issue of repeat prescription: Secondary | ICD-10-CM | POA: Insufficient documentation

## 2022-03-15 DIAGNOSIS — Z7689 Persons encountering health services in other specified circumstances: Secondary | ICD-10-CM

## 2022-03-15 MED ORDER — RISPERIDONE 1 MG PO TABS
1.0000 mg | ORAL_TABLET | Freq: Once | ORAL | Status: AC
  Administered 2022-03-15: 1 mg via ORAL
  Filled 2022-03-15: qty 1

## 2022-03-15 NOTE — ED Triage Notes (Signed)
Pt requesting his Risperdal  ?

## 2022-03-15 NOTE — ED Provider Notes (Signed)
?[facility] ?Provider Note ? ? ?CSN: [id number] ?Arrival date & time: 03/15/22  0522 ? ?  ? ?History ? ?Chief Complaint  ?Patient presents with  ? Medication Refill  ? ? ?Cole Barrett is a 36 y.o. male with a history of schizophrenia who presents to the emergency department requesting a dose of his Risperdal.  No other complaints at this time.  No alleviating or aggravating factors.  Denies SI, HI, or current acute hallucinations. ? ?HPI ? ?  ? ?Home Medications ?Prior to Admission medications   ?Medication Sig Start Date End Date Taking? Authorizing Provider  ?bacitracin ointment Apply 1 application. topically 2 (two) times daily. 03/08/22   Henderly, Britni A, PA-C  ?cephALEXin (KEFLEX) 500 MG capsule Take 1 capsule (500 mg total) by mouth 4 (four) times daily. 01/03/22   Horton, Alvin Critchley, DO  ?hydrocortisone cream 1 % Apply 1 application. topically 2 (two) times daily as needed (For rash). 03/05/22   Carlisle Cater, PA-C  ?hydrOXYzine (ATARAX) 25 MG tablet Take 1 tablet (25 mg total) by mouth 3 (three) times daily as needed for anxiety or itching. 12/23/21   Revonda Humphrey, NP  ?naproxen (NAPROSYN) 500 MG tablet Take 1 tablet (500 mg total) by mouth 2 (two) times daily. 01/03/22   Horton, Alvin Critchley, DO  ?neomycin-bacitracin-polymyxin (NEOSPORIN) ointment Apply topically 2 (two) times daily. 01/18/22   Antonietta Breach, PA-C  ?risperiDONE (RISPERDAL) 1 MG tablet Take 1 tablet (1 mg total) by mouth at bedtime. 01/18/22   Prosperi, Christian H, PA-C  ?amantadine (SYMMETREL) 100 MG capsule Take 1 capsule (100 mg total) by mouth 2 (two) times daily. ?Patient not taking: Reported on 11/04/2019 09/01/19 11/17/19  Mallie Darting, NP  ?   ? ?Allergies    ?Patient has no known allergies.   ? ?Review of Systems   ?Review of Systems  ?Constitutional:  Negative for fever.  ?Respiratory:  Negative for shortness of breath.   ?Cardiovascular:  Negative for chest pain.   ?Psychiatric/Behavioral:  Negative for hallucinations and suicidal ideas.   ?All other systems reviewed and are negative. ? ?Physical Exam ?Updated Vital Signs ?BP 134/81   Pulse 68   Temp 98.2 ?F (36.8 ?C) (Oral)   Resp 18   SpO2 94%  ?Physical Exam ?Vitals and nursing note reviewed.  ?Constitutional:   ?   General: He is not in acute distress. ?   Appearance: He is well-developed.  ?HENT:  ?   Head: Normocephalic and atraumatic.  ?Eyes:  ?   General:     ?   Right eye: No discharge.     ?   Left eye: No discharge.  ?   Conjunctiva/sclera: Conjunctivae normal.  ?Cardiovascular:  ?   Rate and Rhythm: Normal rate and regular rhythm.  ?Pulmonary:  ?   Effort: Pulmonary effort is normal. No respiratory distress.  ?   Breath sounds: Normal breath sounds.  ?Musculoskeletal:  ?   Cervical back: Neck supple.  ?Neurological:  ?   Mental Status: He is alert.  ?   Comments: Clear speech.   ?Psychiatric:     ?   Behavior: Behavior is cooperative.     ?   Thought Content: Thought content normal.  ? ? ?ED Results / Procedures / Treatments   ?Labs ?(all labs ordered are listed, but only abnormal results are displayed) ?Labs Reviewed - No data to display ? ?EKG ?None ? ?Radiology ?No results found. ? ?Procedures ?Procedures  ? ? ?  Medications Ordered in ED ?Medications  ?risperiDONE (RISPERDAL) tablet 1 mg (has no administration in time range)  ? ? ?ED Course/ Medical Decision Making/ A&P ?  ?                        ?Medical Decision Making ?Risk ?Prescription drug management. ? ? ?Patient presents to the emergency department requesting a dose of his Risperdal.  Takes this daily, has a refill at the pharmacy but has not been able to get there.  Chart/nursing note reviewed for additional history, multiple prior ED visits for same.  No other additional complaints at this time.  Denies SI, HI, or acute current hallucinations.  Given a dose of his medication and appears appropriate for discharge. ? ?Final Clinical Impression(s) /  ED Diagnoses ?Final diagnoses:  ?Encounter for medication administration  ? ? ?Rx / DC Orders ?ED Discharge Orders   ? ? None  ? ?  ? ? ?  ?Amaryllis Dyke, [place] ?03/15/22 [id number] ? ?  ?Fatima Blank, MD ?03/15/22 [phone] ? ?

## 2022-03-16 ENCOUNTER — Other Ambulatory Visit: Payer: Self-pay

## 2022-03-16 ENCOUNTER — Encounter (HOSPITAL_COMMUNITY): Payer: Self-pay

## 2022-03-16 ENCOUNTER — Emergency Department (HOSPITAL_COMMUNITY)
Admission: EM | Admit: 2022-03-16 | Discharge: 2022-03-16 | Disposition: A | Discharge status: Home / Self Care | Payer: Self-pay | Attending: Emergency Medicine | Admitting: Emergency Medicine

## 2022-03-16 DIAGNOSIS — Z760 Encounter for issue of repeat prescription: Secondary | ICD-10-CM | POA: Insufficient documentation

## 2022-03-16 DIAGNOSIS — F419 Anxiety disorder, unspecified: Secondary | ICD-10-CM

## 2022-03-16 MED ORDER — RISPERIDONE 1 MG PO TABS
1.0000 mg | ORAL_TABLET | Freq: Once | ORAL | Status: AC
  Administered 2022-03-16: 1 mg via ORAL
  Filled 2022-03-16: qty 1

## 2022-03-16 NOTE — ED Triage Notes (Signed)
Patient requesting dose of his Risperdal  ?

## 2022-03-16 NOTE — ED Provider Notes (Signed)
?[facility]-EMERGENCY DEPT ?Provider Note ? ? ?CSN: [id number] ?Arrival date & time: 03/16/22  0547 ? ?  ? ?History ? ?Chief Complaint  ?Patient presents with  ? Medication Refill  ? ? ?Cole Barrett is a 36 y.o. male. ? ?Here for risperdal. States it is at a friends house. No other complaints.  ? ? ?Medication Refill ? ?  ? ?Home Medications ?Prior to Admission medications   ?Medication Sig Start Date End Date Taking? Authorizing Provider  ?bacitracin ointment Apply 1 application. topically 2 (two) times daily. 03/08/22   Henderly, Britni A, PA-C  ?cephALEXin (KEFLEX) 500 MG capsule Take 1 capsule (500 mg total) by mouth 4 (four) times daily. 01/03/22   Horton, Clabe Seal, DO  ?hydrocortisone cream 1 % Apply 1 application. topically 2 (two) times daily as needed (For rash). 03/05/22   Renne Crigler, PA-C  ?hydrOXYzine (ATARAX) 25 MG tablet Take 1 tablet (25 mg total) by mouth 3 (three) times daily as needed for anxiety or itching. 12/23/21   Ardis Hughs, NP  ?naproxen (NAPROSYN) 500 MG tablet Take 1 tablet (500 mg total) by mouth 2 (two) times daily. 01/03/22   Horton, Clabe Seal, DO  ?neomycin-bacitracin-polymyxin (NEOSPORIN) ointment Apply topically 2 (two) times daily. 01/18/22   [name], PA-C  ?risperiDONE (RISPERDAL) 1 MG tablet Take 1 tablet (1 mg total) by mouth at bedtime. 01/18/22   Prosperi, Christian H, PA-C  ?amantadine (SYMMETREL) 100 MG capsule Take 1 capsule (100 mg total) by mouth 2 (two) times daily. ?Patient not taking: Reported on 11/04/2019 09/01/19 11/17/19  [name], NP  ?   ? ?Allergies    ?Patient has no known allergies.   ? ?Review of Systems   ?Review of Systems ? ?Physical Exam ?Updated Vital Signs ?BP 138/90 (BP Location: Left Arm)   Pulse 78   Temp 98.3 ?F (36.8 ?C) (Oral)   Resp 18   SpO2 97%  ?Physical Exam ?Vitals and nursing note reviewed.  ?Constitutional:   ?   Appearance: He is well-developed.  ?HENT:  ?   Head: Normocephalic and  atraumatic.  ?   Nose: Nose normal. No congestion or rhinorrhea.  ?   Mouth/Throat:  ?   Mouth: Mucous membranes are moist.  ?Cardiovascular:  ?   Rate and Rhythm: Normal rate.  ?Pulmonary:  ?   Effort: Pulmonary effort is normal. No respiratory distress.  ?Abdominal:  ?   General: Abdomen is flat. There is no distension.  ?Musculoskeletal:     ?   General: Normal range of motion.  ?   Cervical back: Normal range of motion.  ?Skin: ?   General: Skin is warm and dry.  ?Neurological:  ?   General: No focal deficit present.  ?   Mental Status: He is alert.  ? ? ?ED Results / Procedures / Treatments   ?Labs ?(all labs ordered are listed, but only abnormal results are displayed) ?Labs Reviewed - No data to display ? ?EKG ?None ? ?Radiology ?No results found. ? ?Procedures ?Procedures  ? ? ?Medications Ordered in ED ?Medications  ?risperiDONE (RISPERDAL) tablet 1 mg (has no administration in time range)  ? ? ?ED Course/ Medical Decision Making/ A&P ?  ?                        ?Medical Decision Making ?Risk ?Prescription drug management. ? ? ?To avoid him checking right back in, medication provided.  ? ? ?  Final Clinical Impression(s) / ED Diagnoses ?Final diagnoses:  ?None  ? ? ?Rx / DC Orders ?ED Discharge Orders   ? ? None  ? ?  ? ? ?  ?Marily Memos, MD ?03/16/22 0602 ? ?

## 2022-03-21 ENCOUNTER — Other Ambulatory Visit: Payer: Self-pay

## 2022-03-21 ENCOUNTER — Encounter (HOSPITAL_COMMUNITY): Payer: Self-pay

## 2022-03-21 ENCOUNTER — Emergency Department (HOSPITAL_COMMUNITY)
Admission: EM | Admit: 2022-03-21 | Discharge: 2022-03-21 | Disposition: A | Discharge status: Home / Self Care | Payer: Self-pay | Attending: Emergency Medicine | Admitting: Emergency Medicine

## 2022-03-21 DIAGNOSIS — Z760 Encounter for issue of repeat prescription: Secondary | ICD-10-CM | POA: Insufficient documentation

## 2022-03-21 MED ORDER — RISPERIDONE 1 MG PO TABS
1.0000 mg | ORAL_TABLET | Freq: Once | ORAL | Status: AC
  Administered 2022-03-21: 1 mg via ORAL
  Filled 2022-03-21: qty 1

## 2022-03-21 NOTE — ED Provider Notes (Signed)
?  [facility]-EMERGENCY DEPT ?[facility] Emergency Department ?Provider Note ?MRN:  [id number]  ?Arrival date & time: 03/21/22    ? ?Chief Complaint   ?Medication Refill ?  ?History of Present Illness   ?Cole Barrett is a 36 y.o. year-old male presents to the ED with chief complaint of needing his dose of risperdal.  States that his pill bottle is across town and he can't access it right now. States that he has a refill at the pharmacy, so he shouldn't need anymore, just a dose now to help with hallucinations. ? ?History provided by patient. ? ? ?Review of Systems  ?Pertinent review of systems noted in HPI.  ? ? ?Physical Exam  ? ?Vitals:  ? 03/21/22 0444  ?BP: 137/88  ?Pulse: 84  ?Resp: 18  ?Temp: 98.6 ?F (37 ?C)  ?SpO2: 96%  ?  ?CONSTITUTIONAL:  well-appearing, NAD ?NEURO:  Alert and oriented x 3, CN 3-12 grossly intact ?EYES:  eyes equal and reactive ?ENT/NECK:  Supple, no stridor  ?CARDIO:  , appears well-perfused  ?PULM:  No respiratory distress,  ?GI/GU:  non-distended,  ?MSK/SPINE:  No gross deformities, no edema, moves all extremities  ?SKIN:  no rash, atraumatic ? ? ?*Additional and/or pertinent findings included in MDM below ? ?Diagnostic and Interventional Summary  ? ? EKG Interpretation ? ?Date/Time:    ?Ventricular Rate:    ?PR Interval:    ?QRS Duration:   ?QT Interval:    ?QTC Calculation:   ?R Axis:     ?Text Interpretation:   ?  ? ?  ? ?Labs Reviewed - No data to display  ?No orders to display  ?  ?Medications  ?risperiDONE (RISPERDAL) tablet 1 mg (has no administration in time range)  ?  ? ?Procedures  /  Critical Care ?Procedures ? ?ED Course and Medical Decision Making  ?I have reviewed the triage vital signs, the nursing notes, and pertinent available records from the EMR. ? ?Complexity of Problems Addressed: ?Low Complexity: Stable, chronic illness  ?Comorbidities affecting this illness/injury include: ?Hallucinations ?Social Determinants Affecting Care: ?Complexity of care is  increased due to access to medical care. ? ? ?ED Course: ?Patient given dose of risperdal and discharged. ? ?  ? ?Consultants: ?No consultations were needed in caring for this patient. ? ?Treatment and Plan: ? ? ?Emergency department workup does not suggest an emergent condition requiring admission or immediate intervention beyond  what has been performed at this time. The patient is safe for discharge and has  been instructed to return immediately for worsening symptoms, change in  symptoms or any other concerns ? ? ? ?Final Clinical Impressions(s) / ED Diagnoses  ? ?  ICD-10-CM   ?1. Medication refill  Z76.0   ?  ?  ?ED Discharge Orders   ? ? None  ? ?  ?  ? ? ?Discharge Instructions Discussed with and Provided to Patient:  ? ?Discharge Instructions   ?None ?  ? ?  ?Roxy Horseman, PA-C ?03/21/22 0532 ? ?  ?[name], MD ?03/21/22 [phone] ? ?

## 2022-03-21 NOTE — ED Triage Notes (Signed)
Pt arrived POV from outside stating he needs a dose of risperidone so he can leave. Pt denies any pain or other complaints. Pt states tell them I dont need the cream just the pill  ?

## 2022-03-28 ENCOUNTER — Encounter (HOSPITAL_COMMUNITY): Payer: Self-pay | Admitting: Emergency Medicine

## 2022-03-28 ENCOUNTER — Other Ambulatory Visit: Payer: Self-pay

## 2022-03-28 ENCOUNTER — Emergency Department (HOSPITAL_COMMUNITY)
Admission: EM | Admit: 2022-03-28 | Discharge: 2022-03-28 | Disposition: A | Discharge status: Home / Self Care | Payer: Self-pay | Attending: Emergency Medicine | Admitting: Emergency Medicine

## 2022-03-28 DIAGNOSIS — Z7689 Persons encountering health services in other specified circumstances: Secondary | ICD-10-CM

## 2022-03-28 DIAGNOSIS — Z760 Encounter for issue of repeat prescription: Secondary | ICD-10-CM | POA: Insufficient documentation

## 2022-03-28 DIAGNOSIS — R440 Auditory hallucinations: Secondary | ICD-10-CM | POA: Insufficient documentation

## 2022-03-28 MED ORDER — RISPERIDONE 1 MG PO TABS
1.0000 mg | ORAL_TABLET | Freq: Once | ORAL | Status: AC
Start: 2022-03-28 — End: 2022-03-28
  Administered 2022-03-28: 1 mg via ORAL
  Filled 2022-03-28: qty 1

## 2022-03-28 NOTE — ED Triage Notes (Signed)
Pt requesting Risperidone for auditory hallucinations x a couple years, denies visual hallucinations ?

## 2022-03-28 NOTE — ED Provider Notes (Signed)
?[name] EMERGENCY DEPARTMENT ?Provider Note ? ? ?CSN: [id number] ?Arrival date & time: 03/28/22  0203 ? ?  ? ?History ? ?Chief Complaint  ?Patient presents with  ? Hallucinations  ? ? ?Cole Barrett is a 36 y.o. male with a history of hypercholesterolemia, schizophrenia, homelessness and prior VTE who presents to the ED requesting a dose of his Risperdal. Patient reports his chronic auditory hallucinations intermittently, no acute change, states that he would just like a dose of his medication.  He is otherwise feeling well.  He denies SI, HI, or visual hallucinations.  He denies pain. ? ?HPI ? ?  ? ?Home Medications ?Prior to Admission medications   ?Medication Sig Start Date End Date Taking? Authorizing Provider  ?bacitracin ointment Apply 1 application. topically 2 (two) times daily. 03/08/22   Henderly, Britni A, PA-C  ?cephALEXin (KEFLEX) 500 MG capsule Take 1 capsule (500 mg total) by mouth 4 (four) times daily. 01/03/22   Horton, Clabe Seal, DO  ?hydrocortisone cream 1 % Apply 1 application. topically 2 (two) times daily as needed (For rash). 03/05/22   Renne Crigler, PA-C  ?hydrOXYzine (ATARAX) 25 MG tablet Take 1 tablet (25 mg total) by mouth 3 (three) times daily as needed for anxiety or itching. 12/23/21   Ardis Hughs, NP  ?naproxen (NAPROSYN) 500 MG tablet Take 1 tablet (500 mg total) by mouth 2 (two) times daily. 01/03/22   Horton, Clabe Seal, DO  ?neomycin-bacitracin-polymyxin (NEOSPORIN) ointment Apply topically 2 (two) times daily. 01/18/22   [name], PA-C  ?risperiDONE (RISPERDAL) 1 MG tablet Take 1 tablet (1 mg total) by mouth at bedtime. 01/18/22   Prosperi, Christian H, PA-C  ?amantadine (SYMMETREL) 100 MG capsule Take 1 capsule (100 mg total) by mouth 2 (two) times daily. ?Patient not taking: Reported on 11/04/2019 09/01/19 11/17/19  [name], NP  ?   ? ?Allergies    ?Patient has no known allergies.   ? ?Review of Systems   ?Review of Systems   ?Constitutional:  Negative for chills and fever.  ?Respiratory:  Negative for shortness of breath.   ?Cardiovascular:  Negative for chest pain.  ?Gastrointestinal:  Negative for abdominal pain.  ?Psychiatric/Behavioral:  Positive for hallucinations. Negative for suicidal ideas.   ?All other systems reviewed and are negative. ? ?Physical Exam ?Updated Vital Signs ?BP 127/68 (BP Location: Right Arm)   Pulse 77   Temp 98.3 ?F (36.8 ?C) (Oral)   Resp 16   Ht 5\' 10"  (1.778 m)   Wt 99.8 kg   SpO2 95%   BMI 31.57 kg/m?  ?Physical Exam ?Vitals and nursing note reviewed.  ?Constitutional:   ?   General: He is not in acute distress. ?   Appearance: He is well-developed. He is not toxic-appearing.  ?   Comments: Patient initially sleeping, easily awakened and resting comfortably.  ?HENT:  ?   Head: Normocephalic and atraumatic.  ?Eyes:  ?   General:     ?   Right eye: No discharge.     ?   Left eye: No discharge.  ?   Conjunctiva/sclera: Conjunctivae normal.  ?Cardiovascular:  ?   Rate and Rhythm: Normal rate and regular rhythm.  ?Pulmonary:  ?   Effort: No respiratory distress.  ?   Breath sounds: Normal breath sounds. No wheezing or rales.  ?Abdominal:  ?   General: There is no distension.  ?   Palpations: Abdomen is soft.  ?   Tenderness: There is  no abdominal tenderness.  ?Musculoskeletal:  ?   Cervical back: Neck supple.  ?Skin: ?   General: Skin is warm and dry.  ?Neurological:  ?   Comments: Clear speech.   ?Psychiatric:     ?   Thought Content: Thought content does not include homicidal or suicidal ideation.  ?   Comments: Calm and cooperative.  ? ? ?ED Results / Procedures / Treatments   ?Labs ?(all labs ordered are listed, but only abnormal results are displayed) ?Labs Reviewed - No data to display ? ?EKG ?None ? ?Radiology ?No results found. ? ?Procedures ?Procedures  ? ? ?Medications Ordered in ED ?Medications  ?risperiDONE (RISPERDAL) tablet 1 mg (has no administration in time range)  ? ? ?ED Course/  Medical Decision Making/ A&P ?  ?                        ?Medical Decision Making ?Risk ?Prescription drug management. ? ? ?Patient presents to the emergency department requesting a dose of his Risperdal for his intermittent chronic auditory hallucinations.  He has no other complaints at this time.  He is nontoxic, resting comfortably, his vitals are within normal limits. ? ?Nursing notes and chart reviewed for additional history.  ? ?Patient well-known to the emergency department, appears in his usual state of health, denies acute change in his chronic auditory hallucinations, denies SI or HI.  He was given a dose of his Risperdal medication in the emergency department.  He overall seems appropriate for discharge.  We discussed outpatient follow-up as well strict return precautions.  Provided opportunity for questions, patient is confirmed understanding and is in agreement. ? ?Final Clinical Impression(s) / ED Diagnoses ?Final diagnoses:  ?Encounter for medication administration  ? ? ?Rx / DC Orders ?ED Discharge Orders   ? ? None  ? ?  ? ? ?  ?Cherly Anderson, PA-C ?03/28/22 7425 ? ?  ?[name], MD ?03/28/22 [phone] ? ?

## 2022-03-28 NOTE — Discharge Instructions (Signed)
Please follow-up with behavioral health.  Return to the ER for new or worsening symptoms or any other concerns. ?

## 2022-04-03 ENCOUNTER — Encounter (HOSPITAL_COMMUNITY): Payer: Self-pay

## 2022-04-03 ENCOUNTER — Emergency Department (HOSPITAL_COMMUNITY)
Admission: EM | Admit: 2022-04-03 | Discharge: 2022-04-04 | Disposition: A | Discharge status: Home / Self Care | Payer: Self-pay | Attending: Emergency Medicine | Admitting: Emergency Medicine

## 2022-04-03 ENCOUNTER — Other Ambulatory Visit: Payer: Self-pay

## 2022-04-03 DIAGNOSIS — Z760 Encounter for issue of repeat prescription: Secondary | ICD-10-CM | POA: Insufficient documentation

## 2022-04-03 DIAGNOSIS — Z79899 Other long term (current) drug therapy: Secondary | ICD-10-CM

## 2022-04-03 MED ORDER — RISPERIDONE 1 MG PO TABS
1.0000 mg | ORAL_TABLET | Freq: Once | ORAL | Status: AC
Start: 2022-04-04 — End: 2022-04-04
  Administered 2022-04-04: 1 mg via ORAL
  Filled 2022-04-03: qty 1

## 2022-04-03 NOTE — ED Triage Notes (Signed)
Pt presents to the ED requesting a dose of his Risperdal. ?

## 2022-04-04 NOTE — ED Provider Notes (Signed)
?  [name] EMERGENCY DEPARTMENT ?Provider Note ? ? ?CSN: [id number] ?Arrival date & time: 04/03/22  2341 ? ?  ? ?History ? ?Chief Complaint  ?Patient presents with  ? Medication Refill  ? ? ?Cole Barrett is a 36 y.o. male. ? ?The history is provided by the patient.  ?Medication Refill ? ?Patient presents for medication management.  He is requesting his nighttime dose of Risperdal.  He denies any active hallucinations.  He denies any SI.  He has no other acute complaints ? ?Home Medications ?Prior to Admission medications   ?Medication Sig Start Date End Date Taking? Authorizing Provider  ?bacitracin ointment Apply 1 application. topically 2 (two) times daily. 03/08/22   Henderly, Britni A, PA-C  ?hydrocortisone cream 1 % Apply 1 application. topically 2 (two) times daily as needed (For rash). 03/05/22   Renne Crigler, PA-C  ?hydrOXYzine (ATARAX) 25 MG tablet Take 1 tablet (25 mg total) by mouth 3 (three) times daily as needed for anxiety or itching. 12/23/21   Ardis Hughs, NP  ?risperiDONE (RISPERDAL) 1 MG tablet Take 1 tablet (1 mg total) by mouth at bedtime. 01/18/22   Prosperi, Christian H, PA-C  ?amantadine (SYMMETREL) 100 MG capsule Take 1 capsule (100 mg total) by mouth 2 (two) times daily. ?Patient not taking: Reported on 11/04/2019 09/01/19 11/17/19  [name], NP  ?   ? ?Allergies    ?Patient has no known allergies.   ? ?Review of Systems   ?Review of Systems ? ?Physical Exam ?Updated Vital Signs ?BP 133/86 (BP Location: Right Arm)   Pulse 85   Temp 98.4 ?F (36.9 ?C)   Resp 18   SpO2 97%  ?Physical Exam ?CONSTITUTIONAL: Disheveled, no acute distress ?HEAD: Normocephalic/atraumatic ?EYES: EOM ?ENMT: Mucous membranes moist ?LUNGS: no apparent distress ?NEURO: Pt is awake/alert/appropriate, moves all extremitiesx4.  No facial droop.   ?SKIN: warm, color normal ?PSYCH: no abnormalities of mood noted, alert and oriented to situation ? ?ED Results / Procedures / Treatments    ?Labs ?(all labs ordered are listed, but only abnormal results are displayed) ?Labs Reviewed - No data to display ? ?EKG ?None ? ?Radiology ?No results found. ? ?Procedures ?Procedures  ? ? ?Medications Ordered in ED ?Medications  ?risperiDONE (RISPERDAL) tablet 1 mg (has no administration in time range)  ? ? ?ED Course/ Medical Decision Making/ A&P ?  ?                        ?Medical Decision Making ?Risk ?Prescription drug management. ? ? ?Patient presents for 72nd ER visit in 6 months.  He typically receives 1 mg of rrisperdal.  This has been ordered and he will be referred to behavioral health ? ? ? ? ? ? ? ?Final Clinical Impression(s) / ED Diagnoses ?Final diagnoses:  ?Medication management  ? ? ?Rx / DC Orders ?ED Discharge Orders   ? ? None  ? ?  ? ? ?  ?Zadie Rhine, MD ?04/04/22 0008 ? ?

## 2022-04-10 ENCOUNTER — Emergency Department (HOSPITAL_COMMUNITY)
Admission: EM | Admit: 2022-04-10 | Discharge: 2022-04-10 | Disposition: A | Discharge status: Home / Self Care | Payer: Self-pay | Attending: Emergency Medicine | Admitting: Emergency Medicine

## 2022-04-10 ENCOUNTER — Other Ambulatory Visit: Payer: Self-pay

## 2022-04-10 ENCOUNTER — Encounter (HOSPITAL_COMMUNITY): Payer: Self-pay | Admitting: Emergency Medicine

## 2022-04-10 DIAGNOSIS — R441 Visual hallucinations: Secondary | ICD-10-CM | POA: Insufficient documentation

## 2022-04-10 DIAGNOSIS — R443 Hallucinations, unspecified: Secondary | ICD-10-CM

## 2022-04-10 DIAGNOSIS — Z79899 Other long term (current) drug therapy: Secondary | ICD-10-CM | POA: Insufficient documentation

## 2022-04-10 MED ORDER — RISPERIDONE 1 MG PO TABS
1.0000 mg | ORAL_TABLET | Freq: Once | ORAL | Status: AC
  Administered 2022-04-10: 1 mg via ORAL
  Filled 2022-04-10 (×2): qty 1

## 2022-04-10 NOTE — Discharge Instructions (Signed)
As discussed, with your history of hallucinations, psychiatric disease it is important that he follow-up with our counseling center.  This center is available for assistance 7 days a week.  If you do develop new, or concerning changes, return here. ?

## 2022-04-10 NOTE — ED Triage Notes (Signed)
Pt requesting dose of his Risperdal. No complaints at this time. ?

## 2022-04-10 NOTE — ED Provider Notes (Signed)
?[facility] ?Provider Note ? ? ?CSN: [id number] ?Arrival date & time: 04/10/22  0319 ? ?  ? ?History ? ?Chief Complaint  ?Patient presents with  ? Medication Refill  ? ? ?Cole Barrett is a 36 y.o. male. ? ?HPI ?Patient presents with concern of visual hallucinations and request for his daily dose of Risperdal.  He denies physical pain, states that his medication is at home whereas he was living with a friend in a local apartment.  He denies suicidal or homicidal ideation.  He states that he is having worsening/more frequent visual hallucination, requests med as above, referral to behavioral health.  He denies having a counselor, states that he comes to the emergency department for medications. ?  ? ?Home Medications ?Prior to Admission medications   ?Medication Sig Start Date End Date Taking? Authorizing Provider  ?bacitracin ointment Apply 1 application. topically 2 (two) times daily. 03/08/22   Henderly, Britni A, PA-C  ?hydrocortisone cream 1 % Apply 1 application. topically 2 (two) times daily as needed (For rash). 03/05/22   Carlisle Cater, PA-C  ?hydrOXYzine (ATARAX) 25 MG tablet Take 1 tablet (25 mg total) by mouth 3 (three) times daily as needed for anxiety or itching. 12/23/21   Revonda Humphrey, NP  ?risperiDONE (RISPERDAL) 1 MG tablet Take 1 tablet (1 mg total) by mouth at bedtime. 01/18/22   Prosperi, Christian H, PA-C  ?amantadine (SYMMETREL) 100 MG capsule Take 1 capsule (100 mg total) by mouth 2 (two) times daily. ?Patient not taking: Reported on 11/04/2019 09/01/19 11/17/19  Mallie Darting, NP  ?   ? ?Allergies    ?Patient has no known allergies.   ? ?Review of Systems   ?Review of Systems  ?All other systems reviewed and are negative. ? ?Physical Exam ?Updated Vital Signs ?BP 106/73   Pulse 79   Temp 97.9 ?F (36.6 ?C) (Oral)   Resp 16   SpO2 96%  ?Physical Exam ?Vitals and nursing note reviewed.  ?Constitutional:   ?   General: He is not in acute  distress. ?   Appearance: He is well-developed. He is obese. He is not ill-appearing.  ?HENT:  ?   Head: Normocephalic and atraumatic.  ?Eyes:  ?   Conjunctiva/sclera: Conjunctivae normal.  ?Cardiovascular:  ?   Rate and Rhythm: Normal rate and regular rhythm.  ?Pulmonary:  ?   Effort: Pulmonary effort is normal. No respiratory distress.  ?   Breath sounds: No stridor.  ?Abdominal:  ?   General: There is no distension.  ?Skin: ?   General: Skin is warm and dry.  ?Neurological:  ?   Mental Status: He is alert and oriented to person, place, and time.  ?Psychiatric:     ?   Mood and Affect: Affect is flat.     ?   Thought Content: Thought content does not include homicidal or suicidal ideation.     ?   Cognition and Memory: Abnormal recent memory: [id number].  ? ? ?ED Results / Procedures / Treatments   ?Labs ?(all labs ordered are listed, but only abnormal results are displayed) ?Labs Reviewed - No data to display ? ?EKG ?None ? ?Radiology ?No results found. ? ?Procedures ?Procedures  ? ? ?Medications Ordered in ED ?Medications  ?risperiDONE (RISPERDAL) tablet 1 mg (1 mg Oral Given 04/10/22 0747)  ? ? ?ED Course/ Medical Decision Making/ A&P ? ?This patient with a Hx of multiple medical issues and psychiatric disease including schizoaffective disorder  presents to the ED for concern of hallucinations, this involves an extensive number of treatment options, and is a complaint that carries with it a high risk of complications and morbidity.   ? ?The differential diagnosis includes medication noncompliance given his history of psychiatric disease, new infection, substance abuse ? ? ?Social Determinants of Health: ? ?Substance abuse, psychiatric disease ? ?Additional history obtained: ? ?Additional history and/or information obtained from chart review, notable for 72 ED visits in the past 6 months, frequent visits for request of medication. ? ? ?After the initial evaluation, orders, including: Home medication, Risperdal  provided were initiated. ? ? ? ?Dispostion / Final MDM: ? ?After consideration of the diagnostic results and the patient's response to treatment, patient and I discussed importance of following up with our behavioral health urgent care center given his ongoing hallucinations, history of psychiatric disease.  With no suicidal or homicidal ideation, no physical complaints of pain, no hemodynamic instability no indication for admission or emergent psych consult. ? ? ?Final Clinical Impression(s) / ED Diagnoses ?Final diagnoses:  ?Hallucinations  ? ?  ?Carmin Muskrat, MD ?04/10/22 [phone] ? ?

## 2022-05-07 ENCOUNTER — Other Ambulatory Visit: Payer: Self-pay

## 2022-05-07 ENCOUNTER — Encounter (HOSPITAL_COMMUNITY): Payer: Self-pay | Admitting: Emergency Medicine

## 2022-05-07 ENCOUNTER — Emergency Department (HOSPITAL_COMMUNITY)
Admission: EM | Admit: 2022-05-07 | Discharge: 2022-05-07 | Disposition: A | Discharge status: Home / Self Care | Payer: Self-pay | Attending: Emergency Medicine | Admitting: Emergency Medicine

## 2022-05-07 ENCOUNTER — Emergency Department (HOSPITAL_COMMUNITY)
Admission: EM | Admit: 2022-05-07 | Discharge: 2022-05-08 | Disposition: A | Discharge status: Home / Self Care | Payer: Self-pay | Attending: Emergency Medicine | Admitting: Emergency Medicine

## 2022-05-07 DIAGNOSIS — Z760 Encounter for issue of repeat prescription: Secondary | ICD-10-CM | POA: Insufficient documentation

## 2022-05-07 DIAGNOSIS — Z5900 Homelessness unspecified: Secondary | ICD-10-CM | POA: Insufficient documentation

## 2022-05-07 DIAGNOSIS — Z7689 Persons encountering health services in other specified circumstances: Secondary | ICD-10-CM

## 2022-05-07 MED ORDER — RISPERIDONE 1 MG PO TABS
1.0000 mg | ORAL_TABLET | Freq: Once | ORAL | Status: AC
  Administered 2022-05-07: 1 mg via ORAL
  Filled 2022-05-07: qty 1

## 2022-05-07 NOTE — ED Triage Notes (Signed)
Pt reported to ED requesting risperdal. Denies SI/HI.

## 2022-05-07 NOTE — Discharge Instructions (Signed)
Your blood pressure was elevated in the emergency department today, please have this rechecked by your primary care provider within 1 to 2 weeks.

## 2022-05-07 NOTE — ED Triage Notes (Signed)
  Patient comes in requesting dose of Risperdal.  No other complaints.  Patient acting appropriate in triage.

## 2022-05-07 NOTE — ED Provider Notes (Signed)
[facility] DEPT Provider Note   CSN: [id number] Arrival date & time: 05/07/22  0356     History  Chief Complaint  Patient presents with   Medication Refill    Cole Barrett is a 36 y.o. male with a history of schizophrenia, VTE, and homelessness who presents to the emergency department requesting a dose of his Risperdal.  He has no other complaints at this time.  He states he would like to rest a little bit.  He denies SI, HI, or acute hallucinations.  HPI     Home Medications Prior to Admission medications   Medication Sig Start Date End Date Taking? Authorizing Provider  bacitracin ointment Apply 1 application. topically 2 (two) times daily. 03/08/22   Henderly, Britni A, PA-C  hydrocortisone cream 1 % Apply 1 application. topically 2 (two) times daily as needed (For rash). 03/05/22   Carlisle Cater, PA-C  hydrOXYzine (ATARAX) 25 MG tablet Take 1 tablet (25 mg total) by mouth 3 (three) times daily as needed for anxiety or itching. 12/23/21   Revonda Humphrey, NP  risperiDONE (RISPERDAL) 1 MG tablet Take 1 tablet (1 mg total) by mouth at bedtime. 01/18/22   Prosperi, Christian H, PA-C  amantadine (SYMMETREL) 100 MG capsule Take 1 capsule (100 mg total) by mouth 2 (two) times daily. Patient not taking: Reported on 11/04/2019 09/01/19 11/17/19  Mallie Darting, NP      Allergies    Patient has no known allergies.    Review of Systems   Review of Systems  Constitutional:  Negative for chills and fever.  Respiratory:  Negative for shortness of breath.   Cardiovascular:  Negative for chest pain.  Gastrointestinal:  Negative for abdominal pain.  Psychiatric/Behavioral:  Negative for hallucinations and suicidal ideas.   All other systems reviewed and are negative.   Physical Exam Updated Vital Signs BP (!) 153/101 (BP Location: Right Arm)   Pulse 100   Temp 98 F (36.7 C) (Oral)   Resp 18   Ht 5\' 10"  (1.778 m)   Wt 99.8 kg   SpO2  94%   BMI 31.57 kg/m  Physical Exam Vitals and nursing note reviewed.  Constitutional:      General: He is not in acute distress.    Appearance: He is well-developed. He is not toxic-appearing.  HENT:     Head: Normocephalic and atraumatic.  Eyes:     General:        Right eye: No discharge.        Left eye: No discharge.     Conjunctiva/sclera: Conjunctivae normal.  Cardiovascular:     Rate and Rhythm: Normal rate and regular rhythm.  Pulmonary:     Effort: No respiratory distress.     Breath sounds: Normal breath sounds. No wheezing or rales.  Abdominal:     General: There is no distension.     Palpations: Abdomen is soft.     Tenderness: There is no abdominal tenderness.  Musculoskeletal:     Cervical back: Neck supple.  Skin:    General: Skin is warm and dry.  Neurological:     Mental Status: He is alert.     Comments: Clear speech.   Psychiatric:        Behavior: Behavior normal. Behavior is cooperative.        Thought Content: Thought content does not include homicidal ideation.     Comments: Calm, cooperative, appropriate.      ED Results /  Procedures / Treatments   Labs (all labs ordered are listed, but only abnormal results are displayed) Labs Reviewed - No data to display  EKG None  Radiology No results found.  Procedures Procedures    Medications Ordered in ED Medications - No data to display  ED Course/ Medical Decision Making/ A&P                           Medical Decision Making Risk Prescription drug management.   Patient to the emergency department requesting his usual dose of Risperdal.  He has no additional complaints.  He is nontoxic, his blood pressure is mildly elevated, doubt hypertensive emergency.  He was given the opportunity to rest in the emergency department.  He was given his dose of Risperdal.  He denies SI or HI and does not appear acutely psychotic.  He overall seems reasonable for discharge at this time.   Final  Clinical Impression(s) / ED Diagnoses Final diagnoses:  Encounter for medication administration    Rx / DC Orders ED Discharge Orders     None         Amaryllis Dyke, PA-C 05/07/22 0640    Maudie Flakes, MD 05/07/22 [phone]

## 2022-05-08 MED ORDER — RISPERIDONE 1 MG PO TABS
1.0000 mg | ORAL_TABLET | Freq: Once | ORAL | Status: AC
  Administered 2022-05-08: 1 mg via ORAL
  Filled 2022-05-08: qty 1

## 2022-05-08 NOTE — ED Provider Notes (Signed)
[facility] EMERGENCY DEPARTMENT Provider Note   CSN: [id number] Arrival date & time: 05/07/22  2348     History  Chief Complaint  Patient presents with   Medication Refill    Cole Barrett is a 36 y.o. male.   Medication Refill  36 year old male presents emergency department with wanting a dose of Risperdal.  He is currently endorsing no symptoms including acute hallucination/delusion, SI, HI.  Patient frequents emergency department for medication refill.  Patient is homeless.  He knows where his medicine is but was not able to get to it.   Home Medications Prior to Admission medications   Medication Sig Start Date End Date Taking? Authorizing Provider  bacitracin ointment Apply 1 application. topically 2 (two) times daily. 03/08/22   Henderly, Britni A, PA-C  hydrocortisone cream 1 % Apply 1 application. topically 2 (two) times daily as needed (For rash). 03/05/22   Renne Crigler, PA-C  hydrOXYzine (ATARAX) 25 MG tablet Take 1 tablet (25 mg total) by mouth 3 (three) times daily as needed for anxiety or itching. 12/23/21   Ardis Hughs, NP  risperiDONE (RISPERDAL) 1 MG tablet Take 1 tablet (1 mg total) by mouth at bedtime. 01/18/22   Prosperi, Christian H, PA-C  amantadine (SYMMETREL) 100 MG capsule Take 1 capsule (100 mg total) by mouth 2 (two) times daily. Patient not taking: Reported on 11/04/2019 09/01/19 11/17/19  [name], NP      Allergies    Patient has no known allergies.    Review of Systems   Review of Systems  Constitutional:  Negative for chills and fever.  HENT:  Negative for ear pain and sore throat.   Eyes:  Negative for pain and visual disturbance.  Respiratory:  Negative for cough and shortness of breath.   Cardiovascular:  Negative for chest pain and palpitations.  Gastrointestinal:  Negative for abdominal pain and vomiting.  Genitourinary:  Negative for dysuria and hematuria.  Musculoskeletal:  Negative for  arthralgias and back pain.  Skin:  Negative for color change and rash.  Neurological:  Negative for seizures and syncope.  All other systems reviewed and are negative.   Physical Exam Updated Vital Signs BP 127/71   Pulse 63   Temp 98 F (36.7 C) (Oral)   Resp 16   SpO2 99%  Physical Exam Vitals and nursing note reviewed.  Constitutional:      General: He is not in acute distress.    Appearance: He is well-developed. He is obese.  HENT:     Head: Normocephalic and atraumatic.  Eyes:     Conjunctiva/sclera: Conjunctivae normal.  Cardiovascular:     Rate and Rhythm: Normal rate and regular rhythm.     Heart sounds: No murmur heard. Pulmonary:     Effort: Pulmonary effort is normal. No respiratory distress.     Breath sounds: Normal breath sounds.  Abdominal:     Palpations: Abdomen is soft.     Tenderness: There is no abdominal tenderness.  Musculoskeletal:        General: No swelling.     Cervical back: Neck supple.  Skin:    General: Skin is warm and dry.     Capillary Refill: Capillary refill takes less than 2 seconds.  Neurological:     Mental Status: He is alert.  Psychiatric:        Mood and Affect: Mood normal.     ED Results / Procedures / Treatments   Labs (all labs  ordered are listed, but only abnormal results are displayed) Labs Reviewed - No data to display  EKG None  Radiology No results found.  Procedures Procedures    Medications Ordered in ED Medications  risperiDONE (RISPERDAL) tablet 1 mg (1 mg Oral Given 05/08/22 0813)    ED Course/ Medical Decision Making/ A&P                           Medical Decision Making  This patient presents to the ED for concern of medication refill, this involves an extensive number of treatment options, and is a complaint that carries with it a high risk of complications and morbidity.  The differential diagnosis includes schizophrenic episode paranoia, medication refill   Co morbidities that  complicate the patient evaluation  Homeless   Additional history obtained:  Additional history obtained from previous visits indicating use of emergency department as medical dispensary   Lab Tests:  N/a   Imaging Studies ordered:  N/a   Cardiac Monitoring: / EKG:  The patient was maintained on a cardiac monitor.  I personally viewed and interpreted the cardiac monitored which showed an underlying rhythm of: Sinus rhythm   Consultations Obtained:  N/a   Problem List / ED Course / Critical interventions / Medication management  Medication refill I ordered medication including Risperdal for continuing at home medicine  Reevaluation of the patient after these medicines showed that the patient improved I have reviewed the patients home medicines and have made adjustments as needed   Social Determinants of Health:  Homeless   Test / Admission - Considered:  Medication refill Vitals signs within normal range and stable throughout visit. Laboratory/imaging studies considered but deemed necessary due to asymptomatic patient requesting medication refill At home Resporal was prescribed on the emergency department.  Patient recommended close follow-up with PCP for further medical management of chronic conditions.  Patient declined refill on home medicines currently. Worrisome signs and symptoms were discussed with the patient, and the patient acknowledged understanding to return to the ED if noticed. Patient was stable upon discharge.         Final Clinical Impression(s) / ED Diagnoses Final diagnoses:  Medication refill    Rx / DC Orders ED Discharge Orders     None         Peter Garter, [place] 05/08/22 5465    Gloris Manchester, MD 05/08/22 [phone]

## 2022-05-08 NOTE — Discharge Instructions (Signed)
Continue take at home medication as prescribed.  Consider keeping your prescription medicine on you.  I will provide information to set up a primary care provider to establish care.  Please not hesitate to return to the emergency department for worrisome signs and symptoms we discussed become apparent.

## 2022-05-11 ENCOUNTER — Other Ambulatory Visit: Payer: Self-pay

## 2022-05-11 ENCOUNTER — Encounter (HOSPITAL_COMMUNITY): Payer: Self-pay

## 2022-05-11 ENCOUNTER — Emergency Department (HOSPITAL_COMMUNITY)
Admission: EM | Admit: 2022-05-11 | Discharge: 2022-05-11 | Disposition: A | Discharge status: Home / Self Care | Payer: Self-pay | Attending: Emergency Medicine | Admitting: Emergency Medicine

## 2022-05-11 DIAGNOSIS — Z760 Encounter for issue of repeat prescription: Secondary | ICD-10-CM | POA: Insufficient documentation

## 2022-05-11 MED ORDER — RISPERIDONE 1 MG PO TABS
1.0000 mg | ORAL_TABLET | Freq: Once | ORAL | Status: AC
  Administered 2022-05-11: 1 mg via ORAL
  Filled 2022-05-11: qty 1

## 2022-05-11 NOTE — Discharge Instructions (Addendum)
Follow-up with behavioral health urgent care or other resources listed on resource guide for further management of your Risperdal.  Get help right away if: You have serious side effects of medicine, such as: Swelling of the face, lips, tongue, or throat. Fever, confusion, muscle spasms, or seizures. You have serious thoughts about harming yourself or hurting others.

## 2022-05-11 NOTE — ED Triage Notes (Signed)
PT here bc he forgot one dose of risperdal. Would like to be seen for one pilll of risperdal. Denies SI/HI

## 2022-05-11 NOTE — ED Provider Notes (Addendum)
[facility] EMERGENCY DEPARTMENT Provider Note   CSN: [id number] Arrival date & time: 05/11/22  3716     History  Chief Complaint  Patient presents with   Medication Refill    Cole Barrett is a 36 y.o. male with a history of paranoid behavior, schizophrenia, homelessness.  Presents to the emergency department requesting Risperdal medication.  Patient states that he has a prescription for this medication however "was not able to take it last night."    Patient denies any SI, HI, AVH, pain or discomfort.   Medication Refill      Home Medications Prior to Admission medications   Medication Sig Start Date End Date Taking? Authorizing Provider  bacitracin ointment Apply 1 application. topically 2 (two) times daily. 03/08/22   Henderly, Britni A, PA-C  hydrocortisone cream 1 % Apply 1 application. topically 2 (two) times daily as needed (For rash). 03/05/22   Renne Crigler, PA-C  hydrOXYzine (ATARAX) 25 MG tablet Take 1 tablet (25 mg total) by mouth 3 (three) times daily as needed for anxiety or itching. 12/23/21   Ardis Hughs, NP  risperiDONE (RISPERDAL) 1 MG tablet Take 1 tablet (1 mg total) by mouth at bedtime. 01/18/22   Prosperi, Christian H, PA-C  amantadine (SYMMETREL) 100 MG capsule Take 1 capsule (100 mg total) by mouth 2 (two) times daily. Patient not taking: Reported on 11/04/2019 09/01/19 11/17/19  [name], NP      Allergies    Patient has no known allergies.    Review of Systems   Review of Systems  Constitutional:  Negative for chills and fever.  Eyes:  Negative for visual disturbance.  Respiratory:  Negative for shortness of breath.   Cardiovascular:  Negative for chest pain.  Gastrointestinal:  Negative for abdominal pain, nausea and vomiting.  Musculoskeletal:  Negative for back pain and neck pain.  Skin:  Negative for color change and rash.  Neurological:  Negative for dizziness, syncope, light-headedness and  headaches.  Psychiatric/Behavioral:  Negative for confusion, hallucinations, self-injury and suicidal ideas.     Physical Exam Updated Vital Signs BP 137/82   Pulse 87   Temp 97.7 F (36.5 C) (Oral)   Resp 18   Ht 5\' 10"  (1.778 m)   Wt 99.8 kg   SpO2 97%   BMI 31.57 kg/m  Physical Exam Vitals and nursing note reviewed.  Constitutional:      General: He is not in acute distress.    Appearance: He is not ill-appearing, toxic-appearing or diaphoretic.  HENT:     Head: Normocephalic.  Eyes:     General: No scleral icterus.       Right eye: No discharge.        Left eye: No discharge.  Cardiovascular:     Rate and Rhythm: Normal rate.  Pulmonary:     Effort: Pulmonary effort is normal.  Skin:    General: Skin is warm and dry.     Comments: Superficial abrasions to bilateral forearms, no surrounding erythema, rash, warmth, or purulent discharge.  Neurological:     General: No focal deficit present.     Mental Status: He is alert and oriented to person, place, and time.     GCS: GCS eye subscore is 4. GCS verbal subscore is 5. GCS motor subscore is 6.  Psychiatric:        Attention and Perception: He is attentive. He does not perceive auditory or visual hallucinations.  Speech: Speech normal.        Behavior: Behavior is cooperative.        Thought Content: Thought content is not paranoid or delusional. Thought content does not include homicidal or suicidal ideation. Thought content does not include homicidal or suicidal plan.     ED Results / Procedures / Treatments   Labs (all labs ordered are listed, but only abnormal results are displayed) Labs Reviewed - No data to display  EKG None  Radiology No results found.  Procedures Procedures    Medications Ordered in ED Medications  risperiDONE (RISPERDAL) tablet 1 mg (has no administration in time range)    ED Course/ Medical Decision Making/ A&P                           Medical Decision  Making Risk Prescription drug management.   Alert 36 year old male in no acute distress, nontoxic-appearing.  Presents to the ED requesting Risperdal medication.  Information obtained from patient.  Past medical records were reviewed including previous provider notes, labs, and imaging.  Patient has medical records as outlined in HPI.  Per chart review patient is well-known to the emergency department.  Presents on regular basis requesting Risperdal medication.  Patient is alert to person, place, and time.  No evidence of acute psychiatric emergency.  Patient noted to have superficial abrasions to bilateral forearms.  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  Patient denies any fever, chills, or purulent discharge.  No signs of infection at this time.  Patient given dose of his Risperdal medication.  Given resources for outpatient setting.  Will discharge at this time.  Based on patient's chief complaint, I considered admission might be necessary, however after reassuring ED workup feel patient is reasonable for discharge.  Discussed results, findings, treatment and follow up. Patient advised of return precautions. Patient verbalized understanding and agreed with plan.  Portions of this note were generated with Scientist, clinical (histocompatibility and immunogenetics). Dictation errors may occur despite best attempts at proofreading.         Final Clinical Impression(s) / ED Diagnoses Final diagnoses:  None    Rx / DC Orders ED Discharge Orders     None         Haskel Schroeder, PA-C 05/11/22 0709    Haskel Schroeder, PA-C 05/11/22 0709    [name], MD 05/18/22 [phone]

## 2022-05-13 ENCOUNTER — Encounter (HOSPITAL_COMMUNITY): Payer: Self-pay

## 2022-05-13 ENCOUNTER — Emergency Department (HOSPITAL_COMMUNITY)
Admission: EM | Admit: 2022-05-13 | Discharge: 2022-05-13 | Disposition: A | Discharge status: Home / Self Care | Payer: Self-pay | Attending: Emergency Medicine | Admitting: Emergency Medicine

## 2022-05-13 ENCOUNTER — Other Ambulatory Visit: Payer: Self-pay

## 2022-05-13 DIAGNOSIS — R21 Rash and other nonspecific skin eruption: Secondary | ICD-10-CM | POA: Insufficient documentation

## 2022-05-13 MED ORDER — BACITRACIN ZINC 500 UNIT/GM EX OINT
TOPICAL_OINTMENT | Freq: Two times a day (BID) | CUTANEOUS | Status: DC
  Administered 2022-05-13: 2 via TOPICAL
  Filled 2022-05-13: qty 1.8

## 2022-05-13 NOTE — ED Notes (Signed)
Pt given warm blankets.

## 2022-05-13 NOTE — ED Provider Notes (Signed)
[facility]EMERGENCY DEPT Provider Note   CSN: [id number] Arrival date & time: 05/13/22  0208     History  Chief Complaint  Patient presents with   Rash    Cole Barrett is a 36 y.o. male who complains of rash.  Patient states he has had a rash for "years."  He complains of open sores on his arms.  He denies any itching.  I have seen this patient in the past and have not seen this rash personally.  Patient is adamant that has been on his arms "for years."  He denies any changes in lotions or soaps.  HPI     Home Medications Prior to Admission medications   Medication Sig Start Date End Date Taking? Authorizing Provider  bacitracin ointment Apply 1 application. topically 2 (two) times daily. 03/08/22   Henderly, Britni A, PA-C  hydrocortisone cream 1 % Apply 1 application. topically 2 (two) times daily as needed (For rash). 03/05/22   Renne Crigler, PA-C  hydrOXYzine (ATARAX) 25 MG tablet Take 1 tablet (25 mg total) by mouth 3 (three) times daily as needed for anxiety or itching. 12/23/21   Ardis Hughs, NP  risperiDONE (RISPERDAL) 1 MG tablet Take 1 tablet (1 mg total) by mouth at bedtime. 01/18/22   Prosperi, Christian H, PA-C  amantadine (SYMMETREL) 100 MG capsule Take 1 capsule (100 mg total) by mouth 2 (two) times daily. Patient not taking: Reported on 11/04/2019 09/01/19 11/17/19  [name], NP      Allergies    Patient has no known allergies.    Review of Systems   Review of Systems  Physical Exam Updated Vital Signs BP 115/80   Pulse 94   Temp 98.3 F (36.8 C)   Resp 19   Ht 5\' 10"  (1.778 m)   Wt 99.7 kg   SpO2 98%   BMI 31.54 kg/m  Physical Exam Vitals and nursing note reviewed.  Constitutional:      General: He is not in acute distress.    Appearance: He is well-developed. He is not diaphoretic.  HENT:     Head: Normocephalic and atraumatic.  Eyes:     General: No scleral icterus.    Conjunctiva/sclera:  Conjunctivae normal.  Cardiovascular:     Rate and Rhythm: Normal rate and regular rhythm.     Heart sounds: Normal heart sounds.  Pulmonary:     Effort: Pulmonary effort is normal. No respiratory distress.     Breath sounds: Normal breath sounds.  Abdominal:     Palpations: Abdomen is soft.     Tenderness: There is no abdominal tenderness.  Musculoskeletal:     Cervical back: Normal range of motion and neck supple.  Skin:    General: Skin is warm and dry.     Comments: Multiple open coin sized lesions in various states of healing on the arms only.  Neurological:     Mental Status: He is alert.  Psychiatric:        Behavior: Behavior normal.     ED Results / Procedures / Treatments   Labs (all labs ordered are listed, but only abnormal results are displayed) Labs Reviewed - No data to display  EKG None  Radiology No results found.  Procedures Procedures    Medications Ordered in ED Medications  bacitracin ointment (2 Applications Topical Given 05/13/22 0400)    ED Course/ Medical Decision Making/ A&P  Medical Decision Making Patient with multiple open lesions on the arm.  His physical examination findings are really consistent with skin picking.  No other rash-like lesions on the arms or anywhere else on the body.  He does not appear to have any secondary signs of infection.  We will treat with bacitracin ointment.  He may follow-up with a primary care physician or return for any new or worsening conditions.  Risk OTC drugs.     Final Clinical Impression(s) / ED Diagnoses Final diagnoses:  Rash    Rx / DC Orders ED Discharge Orders     None         Arthor Captain, PA-C 05/13/22 0404    Shon Baton, MD 05/14/22 0700

## 2022-05-13 NOTE — Discharge Instructions (Signed)
Contact a health care provider if: Your emotions or stress has become overwhelming. You develop signs of infection in an area that is affected by picking. Signs of infection may include: Redness, swelling, or pain. Fluid or blood. Warmth. Pus or a bad smell. Fever.

## 2022-05-13 NOTE — ED Notes (Signed)
Okay to give pt several packages of bacitracin ointments per PA Abigial at this time

## 2022-05-13 NOTE — ED Triage Notes (Signed)
Ambulatory to ED stating he would like 2 bottles of cream for his rash, a pair of socks, and a toothbrush.

## 2022-05-15 ENCOUNTER — Encounter (HOSPITAL_COMMUNITY): Payer: Self-pay

## 2022-05-15 ENCOUNTER — Emergency Department (HOSPITAL_COMMUNITY)
Admission: EM | Admit: 2022-05-15 | Discharge: 2022-05-15 | Disposition: A | Discharge status: Home / Self Care | Payer: Self-pay | Attending: Emergency Medicine | Admitting: Emergency Medicine

## 2022-05-15 DIAGNOSIS — Z760 Encounter for issue of repeat prescription: Secondary | ICD-10-CM | POA: Insufficient documentation

## 2022-05-15 MED ORDER — RISPERIDONE 1 MG PO TABS
1.0000 mg | ORAL_TABLET | Freq: Once | ORAL | Status: AC
  Administered 2022-05-15: 1 mg via ORAL
  Filled 2022-05-15 (×2): qty 1

## 2022-05-15 NOTE — ED Provider Notes (Signed)
[name] EMERGENCY DEPARTMENT Provider Note   CSN: [id number] Arrival date & time: 05/15/22  0132     History Chief Complaint  Patient presents with   Medication Refill    Trysten Bernard is a 36 y.o. male patient with history of homelessness, schizoaffective disorder, schizophrenia, and adjustment disorder who presents to the emergency department with request for respite all.  Patient has a prescription for Resporal 1 mg and takes it daily but [place] the [place] of [place] and has not taken it today.  He took his medication yesterday.  Patient also request to speak with a counselor.  He denies any suicidal ideations or thoughts.  No homicidal ideations.  No auditory or visual hallucinations.   Of note, patient was recently seen evaluated at [facility] 2 days ago for a rash.  There was a nontoxic rash and thought to be due to picking.   Medication Refill      Home Medications Prior to Admission medications   Medication Sig Start Date End Date Taking? Authorizing Provider  bacitracin ointment Apply 1 application. topically 2 (two) times daily. 03/08/22   Henderly, Britni A, PA-C  hydrocortisone cream 1 % Apply 1 application. topically 2 (two) times daily as needed (For rash). 03/05/22   Renne Crigler, PA-C  hydrOXYzine (ATARAX) 25 MG tablet Take 1 tablet (25 mg total) by mouth 3 (three) times daily as needed for anxiety or itching. 12/23/21   Ardis Hughs, NP  risperiDONE (RISPERDAL) 1 MG tablet Take 1 tablet (1 mg total) by mouth at bedtime. 01/18/22   Prosperi, Christian H, PA-C  amantadine (SYMMETREL) 100 MG capsule Take 1 capsule (100 mg total) by mouth 2 (two) times daily. Patient not taking: Reported on 11/04/2019 09/01/19 11/17/19  [name], NP      Allergies    Patient has no known allergies.    Review of Systems   Review of Systems  All other systems reviewed and are negative.   Physical Exam Updated Vital Signs BP 128/81  (BP Location: Right Arm)   Pulse 72   Temp 98 F (36.7 C) (Oral)   Resp 15   SpO2 98%  Physical Exam Vitals and nursing note reviewed.  Constitutional:      General: He is not in acute distress.    Appearance: Normal appearance.  HENT:     Head: Normocephalic and atraumatic.  Eyes:     General:        Right eye: No discharge.        Left eye: No discharge.  Cardiovascular:     Comments: Regular rate and rhythm.  S1/S2 are distinct without any evidence of murmur, rubs, or gallops.  Radial pulses are 2+ bilaterally.  Dorsalis pedis pulses are 2+ bilaterally.  No evidence of pedal edema. Pulmonary:     Comments: Clear to auscultation bilaterally.  Normal effort.  No respiratory distress.  No evidence of wheezes, rales, or rhonchi heard throughout. Abdominal:     General: Abdomen is flat. Bowel sounds are normal. There is no distension.     Tenderness: There is no abdominal tenderness. There is no guarding or rebound.  Musculoskeletal:        General: Normal range of motion.     Cervical back: Neck supple.  Skin:    General: Skin is warm and dry.     Findings: No rash.  Neurological:     General: No focal deficit present.  Mental Status: He is alert.  Psychiatric:        Mood and Affect: Mood normal.        Behavior: Behavior normal.     ED Results / Procedures / Treatments   Labs (all labs ordered are listed, but only abnormal results are displayed) Labs Reviewed - No data to display  EKG None  Radiology No results found.  Procedures Procedures    Medications Ordered in ED Medications  risperiDONE (RISPERDAL) tablet 1 mg (has no administration in time range)    ED Course/ Medical Decision Making/ A&P                           Medical Decision Making Sherry Rogus is a 36 y.o. male patient who presents to the emergency department today for a dose of Risperdal.  Patient has no suicidal homicidal ideations and does not meet inpatient criteria for  psychiatric assessment today.  Patient does request to talk with a counselor.  I will provide him resources for be [facility] we can give him a bus pass to go over there.  Strict return precautions were discussed with the patient at the bedside.  He is safe for discharge at this time.   Risk Prescription drug management.    Final Clinical Impression(s) / ED Diagnoses Final diagnoses:  Medication refill    Rx / DC Orders ED Discharge Orders     None         Teressa Lower, [place] 05/15/22 2423    Arby Barrette, MD 05/27/22 1157

## 2022-05-15 NOTE — ED Triage Notes (Signed)
Pt wants Risperdal med refill

## 2022-05-15 NOTE — Discharge Instructions (Signed)
Please go to be [facility] for behavioral health assessment.  Please return to the emergency department for any worsening symptoms you might have.

## 2022-05-15 NOTE — ED Notes (Signed)
Patient sleeping at waiting room , refuse to get up to speak with triage RN .

## 2022-05-17 ENCOUNTER — Other Ambulatory Visit: Payer: Self-pay

## 2022-05-17 ENCOUNTER — Encounter (HOSPITAL_COMMUNITY): Payer: Self-pay | Admitting: Emergency Medicine

## 2022-05-17 ENCOUNTER — Emergency Department (HOSPITAL_COMMUNITY)
Admission: EM | Admit: 2022-05-17 | Discharge: 2022-05-17 | Disposition: A | Discharge status: Home / Self Care | Payer: Self-pay | Attending: Emergency Medicine | Admitting: Emergency Medicine

## 2022-05-17 DIAGNOSIS — F259 Schizoaffective disorder, unspecified: Secondary | ICD-10-CM | POA: Insufficient documentation

## 2022-05-17 DIAGNOSIS — Z760 Encounter for issue of repeat prescription: Secondary | ICD-10-CM | POA: Insufficient documentation

## 2022-05-17 MED ORDER — RISPERIDONE 1 MG PO TABS
1.0000 mg | ORAL_TABLET | Freq: Once | ORAL | Status: AC
  Administered 2022-05-17: 1 mg via ORAL
  Filled 2022-05-17: qty 1

## 2022-05-17 NOTE — ED Provider Notes (Signed)
[facility] EMERGENCY DEPARTMENT Provider Note   CSN: [id number] Arrival date & time: 05/17/22  1209     History Chief Complaint  Patient presents with   Medication Refill    Cole Barrett is a 36 y.o. male.  36 y.o male with a PMH of Schizoaffective, homelessness presents to the ED requesting medication.  Patient is prescribed risperidone, reports he forgot the pills at his house that he has not been back, although his chart review appears that patient is homeless.  He is not requesting a prescription for this.  He does report feeling like "something is about to go off ".  However, he does not endorse any suicidal ideations or homicidal ideations.  Patient seen here recently for the same request.  No visual or auditory hallucinations.  The history is provided by the patient and medical records.  Medication Refill     Home Medications Prior to Admission medications   Medication Sig Start Date End Date Taking? Authorizing Provider  bacitracin ointment Apply 1 application. topically 2 (two) times daily. 03/08/22   Henderly, Britni A, PA-C  hydrocortisone cream 1 % Apply 1 application. topically 2 (two) times daily as needed (For rash). 03/05/22   Renne Crigler, PA-C  hydrOXYzine (ATARAX) 25 MG tablet Take 1 tablet (25 mg total) by mouth 3 (three) times daily as needed for anxiety or itching. 12/23/21   Ardis Hughs, NP  risperiDONE (RISPERDAL) 1 MG tablet Take 1 tablet (1 mg total) by mouth at bedtime. 01/18/22   Prosperi, Christian H, PA-C  amantadine (SYMMETREL) 100 MG capsule Take 1 capsule (100 mg total) by mouth 2 (two) times daily. Patient not taking: Reported on 11/04/2019 09/01/19 11/17/19  [name], NP      Allergies    Patient has no known allergies.    Review of Systems   Review of Systems  Constitutional:  Negative for fever.  Psychiatric/Behavioral:  Positive for behavioral problems. Negative for self-injury and sleep  disturbance. The patient is not nervous/anxious.     Physical Exam Updated Vital Signs BP 119/69 (BP Location: Right Arm)   Pulse 91   Resp 14   SpO2 96%  Physical Exam Vitals and nursing note reviewed.  Constitutional:      Comments: Disheveled  HENT:     Head: Normocephalic and atraumatic.     Mouth/Throat:     Mouth: Mucous membranes are moist.  Cardiovascular:     Rate and Rhythm: Normal rate.  Pulmonary:     Effort: Pulmonary effort is normal.  Abdominal:     General: Abdomen is flat.  Musculoskeletal:     Cervical back: Normal range of motion and neck supple.  Skin:    General: Skin is warm and dry.  Neurological:     Mental Status: He is alert and oriented to person, place, and time.     ED Results / Procedures / Treatments   Labs (all labs ordered are listed, but only abnormal results are displayed) Labs Reviewed - No data to display  EKG None  Radiology No results found.  Procedures Procedures    Medications Ordered in ED Medications  risperiDONE (RISPERDAL) tablet 1 mg (has no administration in time range)    ED Course/ Medical Decision Making/ A&P                           Medical Decision Making Risk Prescription drug management.   Patient  presents to the ED requesting his risperidone as he reports this is at home and he is unable to get into his house.  Multiple visits in the past requesting the same medication.  Vitals are stable on today's visit.  Patient is homeless of note, he does not appear in any distress at this time however has a very flat affect.  Denying any SI, HI, visual or auditory hallucinations.  I do feel that given patient's medication on today's visit is appropriate.  I do feel that patient needs further close follow-up with [facility] along with psychiatry.  Patient agreeable with plan and treatment, stable for discharge.   Portions of this note were generated with Scientist, clinical (histocompatibility and immunogenetics). Dictation errors may occur  despite best attempts at proofreading.   Final Clinical Impression(s) / ED Diagnoses Final diagnoses:  Medication refill    Rx / DC Orders ED Discharge Orders     None         Claude Manges, PA-C 05/17/22 1405    Milagros Loll, MD 05/17/22 [phone]

## 2022-05-17 NOTE — Discharge Instructions (Addendum)
Please follow-up with [facility] for any psychiatric needs.

## 2022-05-17 NOTE — ED Triage Notes (Signed)
Pt states he left his risperal at his [place] "[place] the [place] side of town" and hasn't had it in a few days. Pt states he needs a pill today before "something happens." Pt states he will go back to his house tonight and will have access to his pills. Pt also states he needs cream for a rash on the side of his neck.

## 2022-05-18 ENCOUNTER — Emergency Department (HOSPITAL_COMMUNITY)
Admission: EM | Admit: 2022-05-18 | Discharge: 2022-05-18 | Disposition: A | Discharge status: Home / Self Care | Payer: Self-pay | Attending: Student | Admitting: Student

## 2022-05-18 ENCOUNTER — Other Ambulatory Visit: Payer: Self-pay

## 2022-05-18 ENCOUNTER — Encounter (HOSPITAL_COMMUNITY): Payer: Self-pay

## 2022-05-18 DIAGNOSIS — Z7689 Persons encountering health services in other specified circumstances: Secondary | ICD-10-CM

## 2022-05-18 DIAGNOSIS — Z760 Encounter for issue of repeat prescription: Secondary | ICD-10-CM | POA: Insufficient documentation

## 2022-05-18 DIAGNOSIS — Z5900 Homelessness unspecified: Secondary | ICD-10-CM | POA: Insufficient documentation

## 2022-05-18 MED ORDER — RISPERIDONE 1 MG PO TABS
1.0000 mg | ORAL_TABLET | Freq: Once | ORAL | Status: AC
  Administered 2022-05-18: 1 mg via ORAL
  Filled 2022-05-18: qty 1

## 2022-05-18 NOTE — ED Triage Notes (Signed)
Pt is requesting a refill of his risperidone and is also asking for a cream for a rash on his neck

## 2022-05-18 NOTE — ED Provider Notes (Signed)
[name] EMERGENCY DEPARTMENT Provider Note   CSN: [id number] Arrival date & time: 05/18/22  0152     History  Chief Complaint  Patient presents with   Medication Refill    Cole Barrett is a 36 y.o. male with a hx of schizoaffective disorder, homelessness, and prior VTE who presents to the ED requesting his risperdal. No other complaints at this time. Denies SI or HI.   HPI     Home Medications Prior to Admission medications   Medication Sig Start Date End Date Taking? Authorizing Provider  bacitracin ointment Apply 1 application. topically 2 (two) times daily. 03/08/22   Henderly, Britni A, PA-C  hydrocortisone cream 1 % Apply 1 application. topically 2 (two) times daily as needed (For rash). 03/05/22   Renne Crigler, PA-C  hydrOXYzine (ATARAX) 25 MG tablet Take 1 tablet (25 mg total) by mouth 3 (three) times daily as needed for anxiety or itching. 12/23/21   Ardis Hughs, NP  risperiDONE (RISPERDAL) 1 MG tablet Take 1 tablet (1 mg total) by mouth at bedtime. 01/18/22   Prosperi, Christian H, PA-C  amantadine (SYMMETREL) 100 MG capsule Take 1 capsule (100 mg total) by mouth 2 (two) times daily. Patient not taking: Reported on 11/04/2019 09/01/19 11/17/19  [name], NP      Allergies    Patient has no known allergies.    Review of Systems   Review of Systems  Constitutional:  Negative for chills and fever.  Respiratory:  Negative for shortness of breath.   Cardiovascular:  Negative for chest pain.  Gastrointestinal:  Negative for abdominal pain.  Psychiatric/Behavioral:  Negative for suicidal ideas.   All other systems reviewed and are negative.   Physical Exam Updated Vital Signs BP 136/73 (BP Location: Right Arm)   Pulse 96   Temp 98.6 F (37 C) (Oral)   Resp 20   Ht 5\' 10"  (1.778 m)   Wt 99.3 kg   SpO2 94%   BMI 31.42 kg/m  Physical Exam Vitals and nursing note reviewed.  Constitutional:      General: He is not in  acute distress.    Appearance: He is well-developed. He is not toxic-appearing.  HENT:     Head: Normocephalic and atraumatic.  Eyes:     General:        Right eye: No discharge.        Left eye: No discharge.     Conjunctiva/sclera: Conjunctivae normal.  Cardiovascular:     Rate and Rhythm: Normal rate and regular rhythm.  Pulmonary:     Effort: No respiratory distress.     Breath sounds: Normal breath sounds. No wheezing or rales.  Abdominal:     General: There is no distension.     Palpations: Abdomen is soft.     Tenderness: There is no abdominal tenderness.  Musculoskeletal:     Cervical back: Neck supple.  Skin:    General: Skin is warm and dry.  Neurological:     Mental Status: He is alert.     Comments: Clear speech.   Psychiatric:        Behavior: Behavior normal.     ED Results / Procedures / Treatments   Labs (all labs ordered are listed, but only abnormal results are displayed) Labs Reviewed - No data to display  EKG None  Radiology No results found.  Procedures Procedures    Medications Ordered in ED Medications  risperiDONE (RISPERDAL) tablet 1 mg (  1 mg Oral Given 05/18/22 0355)    ED Course/ Medical Decision Making/ A&P                           Medical Decision Making Risk Prescription drug management.   Patient to the emergency department requesting his usual dose of Risperdal.  He has no additional complaints.  He is nontoxic, vitals unremarkable. He was given the opportunity to rest in the emergency department.  He was given his dose of Risperdal.  He denies SI or HI and does not appear acutely psychotic.  He overall seems reasonable for discharge at this time.   Final Clinical Impression(s) / ED Diagnoses Final diagnoses:  Encounter for medication administration    Rx / DC Orders ED Discharge Orders     None         Cherly Anderson, PA-C 05/18/22 7510    Gilda Crease, MD 05/18/22 [phone]

## 2022-05-19 ENCOUNTER — Emergency Department (HOSPITAL_COMMUNITY)
Admission: EM | Admit: 2022-05-19 | Discharge: 2022-05-19 | Disposition: A | Discharge status: Home / Self Care | Payer: Self-pay | Attending: Emergency Medicine | Admitting: Emergency Medicine

## 2022-05-19 ENCOUNTER — Other Ambulatory Visit: Payer: Self-pay

## 2022-05-19 ENCOUNTER — Encounter (HOSPITAL_COMMUNITY): Payer: Self-pay

## 2022-05-19 DIAGNOSIS — Z760 Encounter for issue of repeat prescription: Secondary | ICD-10-CM | POA: Insufficient documentation

## 2022-05-19 MED ORDER — RISPERIDONE 1 MG PO TABS
1.0000 mg | ORAL_TABLET | Freq: Once | ORAL | Status: AC
  Administered 2022-05-19: 1 mg via ORAL
  Filled 2022-05-19: qty 1

## 2022-05-19 MED ORDER — HYDROCORTISONE 1 % EX CREA
TOPICAL_CREAM | Freq: Two times a day (BID) | CUTANEOUS | Status: DC
  Filled 2022-05-19: qty 28

## 2022-05-19 NOTE — ED Triage Notes (Signed)
Requesting daily dose of risperidone

## 2022-05-20 ENCOUNTER — Encounter (HOSPITAL_COMMUNITY): Payer: Self-pay

## 2022-05-20 ENCOUNTER — Other Ambulatory Visit: Payer: Self-pay

## 2022-05-20 ENCOUNTER — Emergency Department (HOSPITAL_COMMUNITY)
Admission: EM | Admit: 2022-05-20 | Discharge: 2022-05-20 | Disposition: A | Discharge status: Home / Self Care | Payer: Self-pay | Attending: Emergency Medicine | Admitting: Emergency Medicine

## 2022-05-20 DIAGNOSIS — Z760 Encounter for issue of repeat prescription: Secondary | ICD-10-CM | POA: Insufficient documentation

## 2022-05-20 DIAGNOSIS — Z7689 Persons encountering health services in other specified circumstances: Secondary | ICD-10-CM

## 2022-05-20 DIAGNOSIS — R030 Elevated blood-pressure reading, without diagnosis of hypertension: Secondary | ICD-10-CM | POA: Insufficient documentation

## 2022-05-20 DIAGNOSIS — Z5900 Homelessness unspecified: Secondary | ICD-10-CM | POA: Insufficient documentation

## 2022-05-20 MED ORDER — RISPERIDONE 1 MG PO TABS
1.0000 mg | ORAL_TABLET | Freq: Once | ORAL | Status: AC
  Administered 2022-05-20: 1 mg via ORAL
  Filled 2022-05-20: qty 1

## 2022-05-23 ENCOUNTER — Encounter (HOSPITAL_COMMUNITY): Payer: Self-pay | Admitting: Emergency Medicine

## 2022-05-23 ENCOUNTER — Other Ambulatory Visit: Payer: Self-pay

## 2022-05-23 ENCOUNTER — Emergency Department (HOSPITAL_COMMUNITY)
Admission: EM | Admit: 2022-05-23 | Discharge: 2022-05-23 | Disposition: A | Discharge status: Home / Self Care | Payer: Self-pay | Attending: Emergency Medicine | Admitting: Emergency Medicine

## 2022-05-23 DIAGNOSIS — F209 Schizophrenia, unspecified: Secondary | ICD-10-CM | POA: Insufficient documentation

## 2022-05-23 DIAGNOSIS — Z760 Encounter for issue of repeat prescription: Secondary | ICD-10-CM | POA: Insufficient documentation

## 2022-05-23 DIAGNOSIS — Z5900 Homelessness unspecified: Secondary | ICD-10-CM | POA: Insufficient documentation

## 2022-05-23 MED ORDER — RISPERIDONE 1 MG PO TABS
1.0000 mg | ORAL_TABLET | Freq: Once | ORAL | Status: AC
  Administered 2022-05-23: 1 mg via ORAL
  Filled 2022-05-23: qty 1

## 2022-05-26 ENCOUNTER — Emergency Department (HOSPITAL_COMMUNITY)
Admission: EM | Admit: 2022-05-26 | Discharge: 2022-05-26 | Disposition: A | Discharge status: Home / Self Care | Payer: Self-pay | Attending: Emergency Medicine | Admitting: Emergency Medicine

## 2022-05-26 ENCOUNTER — Other Ambulatory Visit: Payer: Self-pay

## 2022-05-26 ENCOUNTER — Encounter (HOSPITAL_COMMUNITY): Payer: Self-pay | Admitting: Emergency Medicine

## 2022-05-26 DIAGNOSIS — X58XXXA Exposure to other specified factors, initial encounter: Secondary | ICD-10-CM | POA: Insufficient documentation

## 2022-05-26 DIAGNOSIS — S41102A Unspecified open wound of left upper arm, initial encounter: Secondary | ICD-10-CM | POA: Insufficient documentation

## 2022-05-26 DIAGNOSIS — S41101A Unspecified open wound of right upper arm, initial encounter: Secondary | ICD-10-CM | POA: Insufficient documentation

## 2022-05-26 DIAGNOSIS — T148XXA Other injury of unspecified body region, initial encounter: Secondary | ICD-10-CM

## 2022-05-26 DIAGNOSIS — S81801A Unspecified open wound, right lower leg, initial encounter: Secondary | ICD-10-CM | POA: Insufficient documentation

## 2022-05-26 MED ORDER — BACITRACIN-NEOMYCIN-POLYMYXIN OINTMENT TUBE
1.0000 | TOPICAL_OINTMENT | Freq: Every day | CUTANEOUS | Status: DC
  Administered 2022-05-26: 1 via TOPICAL
  Filled 2022-05-26: qty 14

## 2022-05-26 NOTE — ED Notes (Signed)
Patient verbalizes understanding of discharge instructions. Opportunity for questioning and answers were provided. Armband removed by staff, pt discharged from ED ambulatory.   

## 2022-05-26 NOTE — ED Notes (Signed)
See the triage note and the dcotors note  they saw before myself

## 2022-05-26 NOTE — ED Triage Notes (Signed)
Patient requesting antibiotic cream for his left forearm skin abrasions .

## 2022-05-27 NOTE — ED Provider Notes (Signed)
  [name] EMERGENCY DEPARTMENT Provider Note   CSN: [id number] Arrival date & time: 05/26/22  0234     History  No chief complaint on file.   Cole Barrett is a 36 y.o. male.  Here with multiple open wounds to both arms and right leg. States they itched and wants them checked out. No other concerns.         Home Medications Prior to Admission medications   Medication Sig Start Date End Date Taking? Authorizing Provider  bacitracin ointment Apply 1 application. topically 2 (two) times daily. 03/08/22   Henderly, Britni A, PA-C  hydrocortisone cream 1 % Apply 1 application. topically 2 (two) times daily as needed (For rash). 03/05/22   Renne Crigler, PA-C  hydrOXYzine (ATARAX) 25 MG tablet Take 1 tablet (25 mg total) by mouth 3 (three) times daily as needed for anxiety or itching. 12/23/21   Ardis Hughs, NP  risperiDONE (RISPERDAL) 1 MG tablet Take 1 tablet (1 mg total) by mouth at bedtime. 01/18/22   Prosperi, Christian H, PA-C  amantadine (SYMMETREL) 100 MG capsule Take 1 capsule (100 mg total) by mouth 2 (two) times daily. Patient not taking: Reported on 11/04/2019 09/01/19 11/17/19  [name], NP      Allergies    Patient has no known allergies.    Review of Systems   Review of Systems  Physical Exam Updated Vital Signs BP 101/76   Pulse 81   Temp 97.7 F (36.5 C) (Oral)   Resp 18   SpO2 98%  Physical Exam Vitals and nursing note reviewed.  Constitutional:      Appearance: He is well-developed.  HENT:     Head: Normocephalic and atraumatic.     Mouth/Throat:     Mouth: Mucous membranes are moist.     Pharynx: Oropharynx is clear.  Eyes:     Pupils: Pupils are equal, round, and reactive to light.  Cardiovascular:     Rate and Rhythm: Normal rate.  Pulmonary:     Effort: Pulmonary effort is normal. No respiratory distress.  Abdominal:     General: Abdomen is flat. There is no distension.  Musculoskeletal:         General: Normal range of motion.     Cervical back: Normal range of motion.  Skin:    Findings: Lesion (multiple open wounds on BUE and RLE without surrounding erythema or drainage) present.  Neurological:     General: No focal deficit present.     Mental Status: He is alert.     ED Results / Procedures / Treatments   Labs (all labs ordered are listed, but only abnormal results are displayed) Labs Reviewed - No data to display  EKG None  Radiology No results found.  Procedures Procedures    Medications Ordered in ED Medications - No data to display  ED Course/ Medical Decision Making/ A&P                           Medical Decision Making  No obvious infection, will suggest neosporin. Not psychiatric.   Final Clinical Impression(s) / ED Diagnoses Final diagnoses:  Multiple wounds of skin    Rx / DC Orders ED Discharge Orders     None         Cadarius Nevares, Barbara Cower, MD 05/27/22 [phone]

## 2022-06-01 DIAGNOSIS — Z760 Encounter for issue of repeat prescription: Secondary | ICD-10-CM | POA: Insufficient documentation

## 2022-06-02 ENCOUNTER — Emergency Department (HOSPITAL_COMMUNITY)
Admission: EM | Admit: 2022-06-02 | Discharge: 2022-06-02 | Disposition: A | Discharge status: Home / Self Care | Payer: Self-pay | Attending: Emergency Medicine | Admitting: Emergency Medicine

## 2022-06-02 ENCOUNTER — Encounter (HOSPITAL_COMMUNITY): Payer: Self-pay | Admitting: Emergency Medicine

## 2022-06-02 DIAGNOSIS — Z7689 Persons encountering health services in other specified circumstances: Secondary | ICD-10-CM

## 2022-06-02 MED ORDER — RISPERIDONE 1 MG PO TABS
1.0000 mg | ORAL_TABLET | Freq: Once | ORAL | Status: AC
  Administered 2022-06-02: 1 mg via ORAL
  Filled 2022-06-02: qty 1

## 2022-06-02 NOTE — ED Triage Notes (Signed)
Pt is requesting his psy medications.  Pt is sleeping in triage but easily awaken.

## 2022-06-02 NOTE — ED Provider Notes (Signed)
[facility] EMERGENCY DEPARTMENT Provider Note   CSN: [id number] Arrival date & time: 06/01/22  2345     History  Chief Complaint  Patient presents with   Medication Refill    Cole Barrett is a 36 y.o. male.  Cole Barrett is a 36 y.o. male with a history of homelessness, schizophrenia, PE, who presents to the ED requesting medication for his head.  Patient reports that he needs a dose of his risperidone.  He denies headache, numbness, weakness, visual changes.  Denies any chest pain or shortness of breath.  No other complaints.  The history is provided by the patient and medical records.       Home Medications Prior to Admission medications   Medication Sig Start Date End Date Taking? Authorizing Provider  bacitracin ointment Apply 1 application. topically 2 (two) times daily. 03/08/22   Henderly, Britni A, PA-C  hydrocortisone cream 1 % Apply 1 application. topically 2 (two) times daily as needed (For rash). 03/05/22   Renne Crigler, PA-C  hydrOXYzine (ATARAX) 25 MG tablet Take 1 tablet (25 mg total) by mouth 3 (three) times daily as needed for anxiety or itching. 12/23/21   Ardis Hughs, NP  risperiDONE (RISPERDAL) 1 MG tablet Take 1 tablet (1 mg total) by mouth at bedtime. 01/18/22   Prosperi, Christian H, PA-C  amantadine (SYMMETREL) 100 MG capsule Take 1 capsule (100 mg total) by mouth 2 (two) times daily. Patient not taking: Reported on 11/04/2019 09/01/19 11/17/19  [name], NP      Allergies    Patient has no known allergies.    Review of Systems   Review of Systems  Physical Exam Updated Vital Signs BP 124/69 (BP Location: Right Arm)   Pulse 76   Temp 98 F (36.7 C) (Oral)   Resp 18   SpO2 95%  Physical Exam Vitals and nursing note reviewed.  Constitutional:      General: He is not in acute distress.    Appearance: Normal appearance. He is well-developed. He is not ill-appearing or diaphoretic.  HENT:      Head: Normocephalic and atraumatic.  Eyes:     General:        Right eye: No discharge.        Left eye: No discharge.  Pulmonary:     Effort: Pulmonary effort is normal. No respiratory distress.  Neurological:     Mental Status: He is alert and oriented to person, place, and time.     Coordination: Coordination normal.  Psychiatric:        Mood and Affect: Mood normal.        Behavior: Behavior normal.     ED Results / Procedures / Treatments   Labs (all labs ordered are listed, but only abnormal results are displayed) Labs Reviewed - No data to display  EKG None  Radiology No results found.  Procedures Procedures    Medications Ordered in ED Medications  risperiDONE (RISPERDAL) tablet 1 mg (has no administration in time range)    ED Course/ Medical Decision Making/ A&P                           Medical Decision Making  Patient here requesting dose of his risperidone.  He has been seen in the ED many times for the same.  He has no other complaints.  Vitals are stable and patient has been calm and cooperative.  Medication given patient stable for discharge.  Given resources for outpatient medication management        Final Clinical Impression(s) / ED Diagnoses Final diagnoses:  Encounter for medication administration    Rx / DC Orders ED Discharge Orders     None         Legrand Rams 06/02/22 0202    Pollyann Savoy, MD 06/02/22 [phone]

## 2022-06-02 NOTE — Discharge Instructions (Signed)
You can follow-up with behavioral health urgent care for medication management as needed.

## 2022-06-07 ENCOUNTER — Encounter (HOSPITAL_COMMUNITY): Payer: Self-pay | Admitting: Emergency Medicine

## 2022-06-07 ENCOUNTER — Emergency Department (HOSPITAL_COMMUNITY)
Admission: EM | Admit: 2022-06-07 | Discharge: 2022-06-07 | Disposition: A | Discharge status: Home / Self Care | Payer: Self-pay | Attending: Student | Admitting: Student

## 2022-06-07 ENCOUNTER — Other Ambulatory Visit: Payer: Self-pay

## 2022-06-07 DIAGNOSIS — R21 Rash and other nonspecific skin eruption: Secondary | ICD-10-CM | POA: Insufficient documentation

## 2022-06-07 MED ORDER — BACITRACIN ZINC 500 UNIT/GM EX OINT
TOPICAL_OINTMENT | Freq: Once | CUTANEOUS | Status: AC
  Filled 2022-06-07: qty 28.4

## 2022-06-07 NOTE — ED Provider Notes (Signed)
[facility] EMERGENCY DEPARTMENT Provider Note   CSN: [id number] Arrival date & time: 06/07/22  0040     History  Chief Complaint  Patient presents with   Rash    Cole Barrett is a 36 y.o. male.  Past medical history of homelessness, psychiatric disorder who presents to the emergency department with complaint of rash.  Complains of wounds to the left upper extremity and wanting medication for this.  He states that it is been there for "a while" and improving.  No fevers.  HPI     Home Medications Prior to Admission medications   Medication Sig Start Date End Date Taking? Authorizing Provider  bacitracin ointment Apply 1 application. topically 2 (two) times daily. 03/08/22   Henderly, Britni A, PA-C  hydrocortisone cream 1 % Apply 1 application. topically 2 (two) times daily as needed (For rash). 03/05/22   Renne Crigler, PA-C  hydrOXYzine (ATARAX) 25 MG tablet Take 1 tablet (25 mg total) by mouth 3 (three) times daily as needed for anxiety or itching. 12/23/21   Ardis Hughs, NP  risperiDONE (RISPERDAL) 1 MG tablet Take 1 tablet (1 mg total) by mouth at bedtime. 01/18/22   Prosperi, Christian H, PA-C  amantadine (SYMMETREL) 100 MG capsule Take 1 capsule (100 mg total) by mouth 2 (two) times daily. Patient not taking: Reported on 11/04/2019 09/01/19 11/17/19  [name], NP      Allergies    Patient has no known allergies.    Review of Systems   Review of Systems  Skin:  Positive for rash and wound.  All other systems reviewed and are negative.   Physical Exam Updated Vital Signs BP 130/72   Pulse 72   Temp 98.2 F (36.8 C)   Resp 18   SpO2 95%  Physical Exam Vitals and nursing note reviewed.  Constitutional:      General: He is not in acute distress.    Appearance: Normal appearance. He is not ill-appearing.  HENT:     Head: Normocephalic and atraumatic.  Eyes:     General: No scleral icterus. Pulmonary:     Effort:  Pulmonary effort is normal. No respiratory distress.  Skin:    General: Skin is warm and dry.     Capillary Refill: Capillary refill takes less than 2 seconds.     Findings: Rash present.     Comments: Areas of skin picking with open wounds that do not appear to have surrounding erythema, swelling or drainage concerning for infection  Neurological:     General: No focal deficit present.     Mental Status: He is alert.  Psychiatric:        Mood and Affect: Mood normal.        Behavior: Behavior normal.        Thought Content: Thought content normal.        Judgment: Judgment normal.     ED Results / Procedures / Treatments   Labs (all labs ordered are listed, but only abnormal results are displayed) Labs Reviewed - No data to display  EKG None  Radiology No results found.  Procedures Procedures    Medications Ordered in ED Medications  bacitracin ointment (has no administration in time range)    ED Course/ Medical Decision Making/ A&P                           Medical Decision Making  This patient  presents to the ED for concern of rash, this involves an extensive number of treatment options, and is a complaint that carries with it a high risk of complications and morbidity.  The differential diagnosis includes urticaria, dermatitis, wound picking, cellulitis, etc.  Co morbidities that complicate the patient evaluation Psychiatric disorder   Additional history obtained:  Additional history obtained from: none available  External records from outside source obtained and reviewed including: previous ED physician note  Medications  I ordered medication including bacitracin for wound Reevaluation of the patient after medication shows that patient stayed the same -I reviewed the patient's home medications and did not make adjustments. -I did not prescribe new home medications.  Tests Considered: No further testing required  Critical Interventions: None  required  Consultations: None required  [facility] Homelessness, psychiatric disorder   ED Course:  36 year old male who presents with rash to the left upper extremity. Seen multiple times in the emergency department for similar. The rash is consistent with skin picking and patient is picking the skin at bedside while I am evaluating him.  There are 3 punctate areas on the left upper extremity that appear to be bleeding from recent picking.  He was given bacitracin ointment with this.  No evidence of cellulitis, abscess or otherwise infection.  After consideration of the diagnostic results and the patients response to treatment, I feel that the patent would benefit from discharge. The patient has been appropriately medically screened and/or stabilized in the ED. I have low suspicion for any other emergent medical condition which would require further screening, evaluation or treatment in the ED or require inpatient management. The patient is overall well appearing and non-toxic in appearance. They are hemodynamically stable at time of discharge.   Final Clinical Impression(s) / ED Diagnoses Final diagnoses:  Rash    Rx / DC Orders ED Discharge Orders     None         Cristopher Peru, PA-C 06/07/22 0125    Wilkie Aye Mayer Masker, MD 06/07/22 [phone]

## 2022-06-07 NOTE — ED Triage Notes (Signed)
Pt reported to ED requesting cream for rash on LUE. Pt states it has been there for a while is clearing up.

## 2022-06-07 NOTE — Discharge Instructions (Signed)
You were seen in the emergency department today for a rash.  Please use bacitracin ointment over the area and try not to pick at your skin.  Please return for any evidence of infection such as redness and swelling, fevers.

## 2022-06-08 ENCOUNTER — Other Ambulatory Visit: Payer: Self-pay

## 2022-06-08 ENCOUNTER — Encounter (HOSPITAL_COMMUNITY): Payer: Self-pay

## 2022-06-08 ENCOUNTER — Emergency Department (HOSPITAL_COMMUNITY)
Admission: EM | Admit: 2022-06-08 | Discharge: 2022-06-08 | Disposition: A | Discharge status: Home / Self Care | Payer: Self-pay | Attending: Emergency Medicine | Admitting: Emergency Medicine

## 2022-06-08 DIAGNOSIS — Z760 Encounter for issue of repeat prescription: Secondary | ICD-10-CM | POA: Insufficient documentation

## 2022-06-08 MED ORDER — RISPERIDONE 1 MG PO TABS
1.0000 mg | ORAL_TABLET | Freq: Once | ORAL | Status: AC
  Administered 2022-06-08: 1 mg via ORAL
  Filled 2022-06-08: qty 1

## 2022-06-08 NOTE — ED Triage Notes (Signed)
Pt reports he is here to get a dose of Risperdal. He is unable to get across town to his home tonight to take It. Denies any other complaints.

## 2022-06-08 NOTE — ED Provider Notes (Signed)
[facility] EMERGENCY DEPARTMENT Provider Note   CSN: [id number] Arrival date & time: 06/08/22  0126     History  Chief Complaint  Patient presents with   Medication Refill    Cole Barrett is a 36 y.o. male.  The history is provided by the patient and medical records.  Medication Refill  36 y.o. M here requesting night time dose of risperdal.  He states he has medication at home but cannot get across town to his house to get it.  He denies SI/HI.  No other complaints.  Home Medications Prior to Admission medications   Medication Sig Start Date End Date Taking? Authorizing Provider  bacitracin ointment Apply 1 application. topically 2 (two) times daily. 03/08/22   Henderly, Britni A, PA-C  hydrocortisone cream 1 % Apply 1 application. topically 2 (two) times daily as needed (For rash). 03/05/22   Renne Crigler, PA-C  hydrOXYzine (ATARAX) 25 MG tablet Take 1 tablet (25 mg total) by mouth 3 (three) times daily as needed for anxiety or itching. 12/23/21   Ardis Hughs, NP  risperiDONE (RISPERDAL) 1 MG tablet Take 1 tablet (1 mg total) by mouth at bedtime. 01/18/22   Prosperi, Christian H, PA-C  amantadine (SYMMETREL) 100 MG capsule Take 1 capsule (100 mg total) by mouth 2 (two) times daily. Patient not taking: Reported on 11/04/2019 09/01/19 11/17/19  [name], NP      Allergies    Patient has no known allergies.    Review of Systems   Review of Systems  Psychiatric/Behavioral:         Med refill  All other systems reviewed and are negative.   Physical Exam Updated Vital Signs BP 125/70 (BP Location: Right Arm)   Pulse 72   Resp 16   SpO2 94%   Physical Exam Vitals and nursing note reviewed.  Constitutional:      Appearance: He is well-developed.  HENT:     Head: Normocephalic and atraumatic.  Eyes:     Conjunctiva/sclera: Conjunctivae normal.     Pupils: Pupils are equal, round, and reactive to light.  Cardiovascular:      Rate and Rhythm: Normal rate and regular rhythm.     Heart sounds: Normal heart sounds.  Pulmonary:     Effort: Pulmonary effort is normal. No respiratory distress.     Breath sounds: Normal breath sounds. No rhonchi.  Abdominal:     General: Bowel sounds are normal.     Palpations: Abdomen is soft.     Tenderness: There is no abdominal tenderness. There is no rebound.  Musculoskeletal:        General: Normal range of motion.     Cervical back: Normal range of motion.  Skin:    General: Skin is warm and dry.  Neurological:     Mental Status: He is alert and oriented to person, place, and time.  Psychiatric:     Comments: Denies SI/HI     ED Results / Procedures / Treatments   Labs (all labs ordered are listed, but only abnormal results are displayed) Labs Reviewed - No data to display  EKG None  Radiology No results found.  Procedures Procedures    Medications Ordered in ED Medications  risperiDONE (RISPERDAL) tablet 1 mg (has no administration in time range)    ED Course/ Medical Decision Making/ A&P  Medical Decision Making Risk Prescription drug management.   36 y.o. M here requesting dose of Risperdal.  Seen almost nightly for same.  No other complaints.  Denies SI/HI.  Medication given in triage.  Stable for discharge.  Final Clinical Impression(s) / ED Diagnoses Final diagnoses:  Medication refill    Rx / DC Orders ED Discharge Orders     None         Garlon Hatchet, PA-C 06/08/22 0216    Maia Plan, MD 06/08/22 [phone]

## 2022-06-09 ENCOUNTER — Other Ambulatory Visit: Payer: Self-pay

## 2022-06-09 ENCOUNTER — Encounter (HOSPITAL_COMMUNITY): Payer: Self-pay | Admitting: Emergency Medicine

## 2022-06-09 ENCOUNTER — Emergency Department (HOSPITAL_COMMUNITY)
Admission: EM | Admit: 2022-06-09 | Discharge: 2022-06-09 | Disposition: A | Discharge status: Home / Self Care | Payer: Self-pay | Attending: Emergency Medicine | Admitting: Emergency Medicine

## 2022-06-09 DIAGNOSIS — R21 Rash and other nonspecific skin eruption: Secondary | ICD-10-CM | POA: Insufficient documentation

## 2022-06-09 MED ORDER — MUPIROCIN CALCIUM 2 % EX CREA: 1.0000 | TOPICAL_CREAM | Freq: Two times a day (BID) | CUTANEOUS | 0 refills | Status: DC

## 2022-06-09 MED ORDER — MUPIROCIN CALCIUM 2 % EX CREA
TOPICAL_CREAM | Freq: Once | CUTANEOUS | Status: AC
  Filled 2022-06-09: qty 15

## 2022-06-09 NOTE — ED Notes (Signed)
Pt refused vitals at discharge. 

## 2022-06-09 NOTE — ED Provider Notes (Signed)
[facility] EMERGENCY DEPARTMENT Provider Note   CSN: [id number] Arrival date & time: 06/09/22  0404     History  Chief Complaint  Patient presents with   Rash    Cole Barrett is a 36 y.o. male.   Rash   Patient with medical history of paranoid behavior, schizophrenia, unstable housing presents today due to rash on left forearm.  Seen there for about 4 days, seen 2 days ago and given bacitracin.  He states the bacitracin has helped but he misplaced it.  Denies any fevers, nausea, vomiting, recent antibiotic use.  The rash is somewhat itchy, he has been scratching at it.  Home Medications Prior to Admission medications   Medication Sig Start Date End Date Taking? Authorizing Provider  bacitracin ointment Apply 1 application. topically 2 (two) times daily. 03/08/22   Henderly, Britni A, PA-C  hydrocortisone cream 1 % Apply 1 application. topically 2 (two) times daily as needed (For rash). 03/05/22   Renne Crigler, PA-C  hydrOXYzine (ATARAX) 25 MG tablet Take 1 tablet (25 mg total) by mouth 3 (three) times daily as needed for anxiety or itching. 12/23/21   Ardis Hughs, NP  risperiDONE (RISPERDAL) 1 MG tablet Take 1 tablet (1 mg total) by mouth at bedtime. 01/18/22   Prosperi, Christian H, PA-C  amantadine (SYMMETREL) 100 MG capsule Take 1 capsule (100 mg total) by mouth 2 (two) times daily. Patient not taking: Reported on 11/04/2019 09/01/19 11/17/19  [name], NP      Allergies    Patient has no known allergies.    Review of Systems   Review of Systems  Skin:  Positive for rash.    Physical Exam Updated Vital Signs BP 125/85 (BP Location: Left Arm)   Pulse 81   Temp 98.4 F (36.9 C) (Oral)   Resp 18   SpO2 96%  Physical Exam Vitals and nursing note reviewed. Exam conducted with a chaperone present.  Constitutional:      General: He is not in acute distress.    Appearance: Normal appearance.  HENT:     Head: Normocephalic and  atraumatic.  Eyes:     General: No scleral icterus.    Extraocular Movements: Extraocular movements intact.     Pupils: Pupils are equal, round, and reactive to light.  Skin:    Coloration: Skin is not jaundiced.     Comments: Multiple open wounds to upper extremities and lower extremities.  No surrounding erythema or warmth.  Neurological:     Mental Status: He is alert. Mental status is at baseline.     Coordination: Coordination normal.     ED Results / Procedures / Treatments   Labs (all labs ordered are listed, but only abnormal results are displayed) Labs Reviewed - No data to display  EKG None  Radiology No results found.  Procedures Procedures    Medications Ordered in ED Medications  mupirocin cream (BACTROBAN) 2 % (has no administration in time range)    ED Course/ Medical Decision Making/ A&P                           Medical Decision Making Risk Prescription drug management.   Patient presents due to rash on left forearm.  Not febrile, no recent antibiotic use.  Considered SJS and TN but does not fit.  Patient was seen few days ago, I do not see any signs of cellulitis and do not  think oral antibiotics are indicated.  Will give refill of mupirocin.  Patient discharged in stable condition.        Final Clinical Impression(s) / ED Diagnoses Final diagnoses:  None    Rx / DC Orders ED Discharge Orders     None         Theron Arista, [place] 06/09/22 0434    Tilden Fossa, MD 06/09/22 [phone]

## 2022-06-09 NOTE — ED Triage Notes (Signed)
Pt reported to ED for evaluation of rash to left arm.

## 2022-06-09 NOTE — Discharge Instructions (Addendum)
Using Microcyn cream on your left arm twice daily.  Return if you get fevers or new symptoms.

## 2022-06-22 ENCOUNTER — Other Ambulatory Visit: Payer: Self-pay

## 2022-06-22 ENCOUNTER — Emergency Department (HOSPITAL_COMMUNITY)
Admission: EM | Admit: 2022-06-22 | Discharge: 2022-06-22 | Disposition: A | Discharge status: Home / Self Care | Payer: Self-pay | Attending: Emergency Medicine | Admitting: Emergency Medicine

## 2022-06-22 ENCOUNTER — Encounter (HOSPITAL_COMMUNITY): Payer: Self-pay

## 2022-06-22 DIAGNOSIS — R21 Rash and other nonspecific skin eruption: Secondary | ICD-10-CM | POA: Insufficient documentation

## 2022-06-22 MED ORDER — MUPIROCIN CALCIUM 2 % EX CREA: 1.0000 | TOPICAL_CREAM | Freq: Two times a day (BID) | CUTANEOUS | 0 refills | Status: DC

## 2022-06-22 NOTE — Discharge Instructions (Addendum)
Your mupirocin cream has been refilled.  If you notice any signs of infection such as fever, significant redness or drainage from the site please return for evaluation.  Otherwise follow-up with your PCP.

## 2022-06-22 NOTE — ED Provider Triage Note (Signed)
Emergency Medicine Provider Triage Evaluation Note  Cole Barrett , a 36 y.o. male  was evaluated in triage.  Pt complains of rash.  Present for couple weeks.  Multiple previous visits for the same.  Requesting topical cream..  Review of Systems  Positive: As above Negative: Fever, chills  Physical Exam  BP 131/76 (BP Location: Left Arm)   Pulse 98   Temp 98.4 F (36.9 C) (Oral)   Resp 18   Ht 5\' 10"  (1.778 m)   Wt 97.5 kg   SpO2 95%   BMI 30.85 kg/m  Gen:   Awake, no distress   Resp:  Normal effort MSK:   Moves extremities without difficulty  Other:    Medical Decision Making  Medically screening exam initiated at 8:50 AM.  Appropriate orders placed.  [name] was informed that the remainder of the evaluation will be completed by another provider, this initial triage assessment does not replace that evaluation, and the importance of remaining in the ED until their evaluation is complete.    [name], PA-C 06/22/22 [phone]

## 2022-06-22 NOTE — ED Triage Notes (Signed)
PT states he is here to get some cream for a rash he has had on his arm for a couple of weeks.

## 2022-06-22 NOTE — ED Provider Notes (Signed)
[facility]-EMERGENCY DEPT Provider Note   CSN: [id number] Arrival date & time: 06/22/22  7829     History  Chief Complaint  Patient presents with   Rash    Cole Barrett is a 36 y.o. male.  36 year old male presents today for evaluation of rash that has been present for at least 2 weeks.  Denies any change.  He states he is out of his topical cream.  Per chart review patient has been seen for this rash multiple times in the emergency room.  Denies fever, chills, change to the rash.  The history is provided by the patient. No language interpreter was used.       Home Medications Prior to Admission medications   Medication Sig Start Date End Date Taking? Authorizing Provider  bacitracin ointment Apply 1 application. topically 2 (two) times daily. 03/08/22   Henderly, Britni A, PA-C  hydrocortisone cream 1 % Apply 1 application. topically 2 (two) times daily as needed (For rash). 03/05/22   Renne Crigler, PA-C  hydrOXYzine (ATARAX) 25 MG tablet Take 1 tablet (25 mg total) by mouth 3 (three) times daily as needed for anxiety or itching. 12/23/21   Ardis Hughs, NP  mupirocin cream (BACTROBAN) 2 % Apply 1 Application topically 2 (two) times daily. 06/09/22   Theron Arista, PA-C  risperiDONE (RISPERDAL) 1 MG tablet Take 1 tablet (1 mg total) by mouth at bedtime. 01/18/22   Prosperi, Christian H, PA-C  amantadine (SYMMETREL) 100 MG capsule Take 1 capsule (100 mg total) by mouth 2 (two) times daily. Patient not taking: Reported on 11/04/2019 09/01/19 11/17/19  [name], NP      Allergies    Patient has no known allergies.    Review of Systems   Review of Systems  Constitutional:  Negative for chills and fever.  Skin:  Positive for rash.  All other systems reviewed and are negative.   Physical Exam Updated Vital Signs BP 131/76 (BP Location: Left Arm)   Pulse 98   Temp 98.4 F (36.9 C) (Oral)   Resp 18   Ht 5\' 10"  (1.778 m)   Wt 97.5 kg    SpO2 95%   BMI 30.85 kg/m  Physical Exam Vitals and nursing note reviewed.  Constitutional:      General: He is not in acute distress.    Appearance: Normal appearance. He is not ill-appearing.  HENT:     Head: Normocephalic and atraumatic.     Nose: Nose normal.  Eyes:     Conjunctiva/sclera: Conjunctivae normal.  Pulmonary:     Effort: Pulmonary effort is normal. No respiratory distress.  Musculoskeletal:        General: No deformity.  Skin:    Findings: Rash present.  Neurological:     Mental Status: He is alert.     ED Results / Procedures / Treatments   Labs (all labs ordered are listed, but only abnormal results are displayed) Labs Reviewed - No data to display  EKG None  Radiology No results found.  Procedures Procedures    Medications Ordered in ED Medications - No data to display  ED Course/ Medical Decision Making/ A&P                           Medical Decision Making  36 year old male presents today for evaluation of rash.  Per chart review this has been an ongoing issue for patient and has been evaluated  multiple times.  He states he is out of the topical mupirocin cream that he was previously prescribed.  He is requesting a refill.  No signs of infection.  No signs that would raise concern for SJS/TENS.  Patient is appropriate for discharge.  Discharged in stable condition.  Return precautions discussed.   Final Clinical Impression(s) / ED Diagnoses Final diagnoses:  Rash    Rx / DC Orders ED Discharge Orders          Ordered    mupirocin cream (BACTROBAN) 2 %  2 times daily        06/22/22 1040              Marita [place], PA-C 06/22/22 1040    Derwood Kaplan, MD 06/22/22 1546

## 2022-07-09 ENCOUNTER — Other Ambulatory Visit: Payer: Self-pay

## 2022-07-09 ENCOUNTER — Encounter (HOSPITAL_COMMUNITY): Payer: Self-pay | Admitting: Emergency Medicine

## 2022-07-09 ENCOUNTER — Emergency Department (HOSPITAL_COMMUNITY)
Admission: EM | Admit: 2022-07-09 | Discharge: 2022-07-09 | Disposition: A | Discharge status: Home / Self Care | Payer: Self-pay | Attending: Emergency Medicine | Admitting: Emergency Medicine

## 2022-07-09 DIAGNOSIS — Z760 Encounter for issue of repeat prescription: Secondary | ICD-10-CM | POA: Insufficient documentation

## 2022-07-09 MED ORDER — RISPERIDONE 1 MG PO TABS
1.0000 mg | ORAL_TABLET | Freq: Once | ORAL | Status: AC
  Administered 2022-07-09: 1 mg via ORAL
  Filled 2022-07-09: qty 1

## 2022-07-09 NOTE — ED Triage Notes (Signed)
Patient requesting prescription for his Risperidal .

## 2022-07-09 NOTE — ED Provider Notes (Signed)
[facility] EMERGENCY DEPARTMENT Provider Note   CSN: [id number] Arrival date & time: 07/09/22  0037     History  Chief Complaint  Patient presents with   Med refill    Cole Barrett is a 36 y.o. male.  The history is provided by the patient and medical records.   36 year old male presenting to the ED requesting dose of his Risperdal.  He is seen almost nightly for same.  He has no acute complaints.  Denies SI/HI.  Home Medications Prior to Admission medications   Medication Sig Start Date End Date Taking? Authorizing Provider  bacitracin ointment Apply 1 application. topically 2 (two) times daily. 03/08/22   Henderly, Britni A, PA-C  hydrocortisone cream 1 % Apply 1 application. topically 2 (two) times daily as needed (For rash). 03/05/22   Renne Crigler, PA-C  hydrOXYzine (ATARAX) 25 MG tablet Take 1 tablet (25 mg total) by mouth 3 (three) times daily as needed for anxiety or itching. 12/23/21   Ardis Hughs, NP  mupirocin cream (BACTROBAN) 2 % Apply 1 Application topically 2 (two) times daily. 06/22/22   Karie Mainland, Amjad, PA-C  risperiDONE (RISPERDAL) 1 MG tablet Take 1 tablet (1 mg total) by mouth at bedtime. 01/18/22   Prosperi, Christian H, PA-C  amantadine (SYMMETREL) 100 MG capsule Take 1 capsule (100 mg total) by mouth 2 (two) times daily. Patient not taking: Reported on 11/04/2019 09/01/19 11/17/19  [name], NP      Allergies    Patient has no known allergies.    Review of Systems   Review of Systems  Constitutional:        Meds  All other systems reviewed and are negative.   Physical Exam Updated Vital Signs BP 114/71   Pulse 72   Temp 98.1 F (36.7 C) (Oral)   Resp 16   SpO2 95%   Physical Exam Vitals and nursing note reviewed.  Constitutional:      Appearance: He is well-developed.     Comments: Appears at baseline  HENT:     Head: Normocephalic and atraumatic.  Eyes:     Conjunctiva/sclera: Conjunctivae normal.      Pupils: Pupils are equal, round, and reactive to light.  Cardiovascular:     Rate and Rhythm: Normal rate and regular rhythm.     Heart sounds: Normal heart sounds.  Pulmonary:     Effort: Pulmonary effort is normal.     Breath sounds: Normal breath sounds.  Abdominal:     General: Bowel sounds are normal.     Palpations: Abdomen is soft.  Musculoskeletal:        General: Normal range of motion.     Cervical back: Normal range of motion.  Skin:    General: Skin is warm and dry.  Neurological:     Mental Status: He is alert and oriented to person, place, and time.     ED Results / Procedures / Treatments   Labs (all labs ordered are listed, but only abnormal results are displayed) Labs Reviewed - No data to display  EKG None  Radiology No results found.  Procedures Procedures    Medications Ordered in ED Medications  risperiDONE (RISPERDAL) tablet 1 mg (has no administration in time range)    ED Course/ Medical Decision Making/ A&P                           Medical Decision Making Risk  Prescription drug management.   36 year old male presenting to the ED requesting dose of Risperdal.  He is seen almost nightly for same.  Denies SI/HI.  Appears at baseline.  Meds given.  Stable for discharge.  Can return here for new concerns.  Final Clinical Impression(s) / ED Diagnoses Final diagnoses:  Encounter for medication refill    Rx / DC Orders ED Discharge Orders     None         Garlon Hatchet, PA-C 07/09/22 0057    Benjiman Core, MD 07/09/22 [phone]

## 2022-07-17 ENCOUNTER — Emergency Department (HOSPITAL_COMMUNITY)
Admission: EM | Admit: 2022-07-17 | Discharge: 2022-07-17 | Disposition: A | Discharge status: Home / Self Care | Payer: Self-pay | Attending: Emergency Medicine | Admitting: Emergency Medicine

## 2022-07-17 ENCOUNTER — Encounter (HOSPITAL_COMMUNITY): Payer: Self-pay

## 2022-07-17 ENCOUNTER — Other Ambulatory Visit: Payer: Self-pay

## 2022-07-17 DIAGNOSIS — T07XXXA Unspecified multiple injuries, initial encounter: Secondary | ICD-10-CM

## 2022-07-17 DIAGNOSIS — X58XXXA Exposure to other specified factors, initial encounter: Secondary | ICD-10-CM | POA: Insufficient documentation

## 2022-07-17 DIAGNOSIS — Z5900 Homelessness unspecified: Secondary | ICD-10-CM | POA: Insufficient documentation

## 2022-07-17 DIAGNOSIS — S1190XA Unspecified open wound of unspecified part of neck, initial encounter: Secondary | ICD-10-CM | POA: Insufficient documentation

## 2022-07-17 DIAGNOSIS — R21 Rash and other nonspecific skin eruption: Secondary | ICD-10-CM | POA: Insufficient documentation

## 2022-07-17 DIAGNOSIS — S41101A Unspecified open wound of right upper arm, initial encounter: Secondary | ICD-10-CM | POA: Insufficient documentation

## 2022-07-17 DIAGNOSIS — S41102A Unspecified open wound of left upper arm, initial encounter: Secondary | ICD-10-CM | POA: Insufficient documentation

## 2022-07-17 DIAGNOSIS — S31109A Unspecified open wound of abdominal wall, unspecified quadrant without penetration into peritoneal cavity, initial encounter: Secondary | ICD-10-CM | POA: Insufficient documentation

## 2022-07-17 MED ORDER — MUPIROCIN CALCIUM 2 % EX CREA
TOPICAL_CREAM | Freq: Once | CUTANEOUS | Status: AC
  Filled 2022-07-17: qty 15

## 2022-07-17 NOTE — ED Triage Notes (Signed)
Patient requesting refill on hydrocortisone cream for ongoing rash to bilateral arms. States that it is better but needs a little more medicine

## 2022-07-17 NOTE — Discharge Instructions (Addendum)
You came to the emergency department today to be evaluated for your rash.  Your rash appears to be chronic many small wounds.  There were no signs of infection with these wounds.  I have given you a cream in the emergency department, which you may take home and use as needed.  Please follow-up with [facility] for further management.    Get help right away if: You have a red streak going away from your wound. You have a fever. Get help right away if: You have a red streak going away from your wound. You have a fever.

## 2022-07-17 NOTE — ED Provider Notes (Signed)
Cole Barrett [facility] EMERGENCY DEPARTMENT Provider Note   CSN: [id number] Arrival date & time: 07/17/22  0750     History  Chief Complaint  Patient presents with   Rash    Rash to arms for approx. 2 weeks.    Cole Barrett is a 36 y.o. 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  Presents to the emergency department with a chief complaint of rash.  Patient reports that he has had this rash for multiple months.  Patient indicates a spot on his right forearm.  Patient states that he ran out of the cream he was previously using.  Patient is unsure the name of the cream that he was using.    Denies starting any new medications, fevers, chills, facial swelling, hives, trouble swallowing, trouble breathing.     Rash Associated symptoms: no fever, no nausea, no shortness of breath and not vomiting        Home Medications Prior to Admission medications   Medication Sig Start Date End Date Taking? Authorizing Provider  bacitracin ointment Apply 1 application. topically 2 (two) times daily. 03/08/22   Henderly, Britni A, PA-C  hydrocortisone cream 1 % Apply 1 application. topically 2 (two) times daily as needed (For rash). 03/05/22   Renne Crigler, PA-C  hydrOXYzine (ATARAX) 25 MG tablet Take 1 tablet (25 mg total) by mouth 3 (three) times daily as needed for anxiety or itching. 12/23/21   Ardis Hughs, NP  mupirocin cream (BACTROBAN) 2 % Apply 1 Application topically 2 (two) times daily. 06/22/22   Karie Mainland, Amjad, PA-C  risperiDONE (RISPERDAL) 1 MG tablet Take 1 tablet (1 mg total) by mouth at bedtime. 01/18/22   Prosperi, Christian H, PA-C  amantadine (SYMMETREL) 100 MG capsule Take 1 capsule (100 mg total) by mouth 2 (two) times daily. Patient not taking: Reported on 11/04/2019 09/01/19 11/17/19  [name], NP      Allergies    Patient has no known allergies.    Review of Systems   Review of Systems   Constitutional:  Negative for chills and fever.  HENT:  Negative for facial swelling and trouble swallowing.   Respiratory:  Negative for shortness of breath.   Gastrointestinal:  Negative for nausea and vomiting.  Skin:  Positive for rash and wound. Negative for color change and pallor.    Physical Exam Updated Vital Signs BP (!) 143/95 (BP Location: Right Arm)   Pulse 82   Temp 97.9 F (36.6 C) (Oral)   Resp 15   SpO2 100%  Physical Exam Vitals and nursing note reviewed.  Constitutional:      General: He is not in acute distress.    Appearance: He is not ill-appearing, toxic-appearing or diaphoretic.  HENT:     Head: Normocephalic.     Mouth/Throat:     Mouth: Mucous membranes are moist. No oral lesions or angioedema.     Tongue: No lesions. Tongue does not deviate from midline.     Palate: No mass and lesions.     Comments: No sores or lesions to oral mucosa.  No angioedema.  Handles oral secretions without difficulty. Eyes:     General: No scleral icterus.       Right eye: No discharge.        Left eye: No discharge.  Cardiovascular:     Rate and Rhythm: Normal rate.  Pulmonary:     Effort: Pulmonary effort is normal.  Comments: Speaks in full complete sentences without difficulty. Skin:    General: Skin is warm and dry.     Comments: Patient is multiple small wounds to bilateral upper extremities, abdomen, and neck.  Wounds are in various stages of healing.  There is no surrounding erythema to wounds present and no purulent discharge noted.  No rash noted to bilateral extremities, abdomen, back,, face and neck.  No sores or lesions to palms of hands   Patient refused to allow provider to examine lower extremities and feet.  Neurological:     General: No focal deficit present.     Mental Status: He is alert.  Psychiatric:        Behavior: Behavior is cooperative.     ED Results / Procedures / Treatments   Labs (all labs ordered are listed, but only abnormal  results are displayed) Labs Reviewed - No data to display  EKG None  Radiology No results found.  Procedures Procedures    Medications Ordered in ED Medications  mupirocin cream (BACTROBAN) 2 % (has no administration in time range)    ED Course/ Medical Decision Making/ A&P                           Medical Decision Making Risk Prescription drug management.   Alert 36 year old male in no acute distress, nontoxic-appearing.  Presents emergency department with a complaint of rash.  Information obtained from patient.  I reviewed patient's past medical records including previous phone notes, labs and imaging.  Patient has medical history as outlined in HPI which complicates his care.  Per chart review patient has been seen multiple times in the emergency department for what he deems a rash.  Most recently patient was seen 06/22/2022.  Previously patient has been given Bactroban.    Please exam patient noted to have multiple wounds to bilateral upper extremities, abdomen, and neck.  Suspect that these are due to picking at his skin.  No signs of infection at this time.  Oral sores/lesions, patient denies any recent medication changes; low suspicion for SJS/TENS.  We will give patient Bactroban in the emergency department, and sent home with the same.  Based on patient's chief complaint, I considered admission might be necessary, however after reassuring ED workup feel patient is reasonable for discharge.  Discussed results, findings, treatment and follow up. Patient advised of return precautions. Patient verbalized understanding and agreed with plan.  Portions of this note were generated with Scientist, clinical (histocompatibility and immunogenetics). Dictation errors may occur despite best attempts at proofreading.         Final Clinical Impression(s) / ED Diagnoses Final diagnoses:  Wounds, multiple    Rx / DC Orders ED Discharge Orders     None         Berneice Heinrich 07/17/22  1017    Gloris Manchester, MD 07/19/22 [phone]

## 2022-07-22 ENCOUNTER — Other Ambulatory Visit: Payer: Self-pay

## 2022-07-22 ENCOUNTER — Emergency Department (HOSPITAL_COMMUNITY)
Admission: EM | Admit: 2022-07-22 | Discharge: 2022-07-22 | Disposition: A | Discharge status: Home / Self Care | Payer: Self-pay | Attending: Emergency Medicine | Admitting: Emergency Medicine

## 2022-07-22 ENCOUNTER — Encounter (HOSPITAL_COMMUNITY): Payer: Self-pay | Admitting: Emergency Medicine

## 2022-07-22 DIAGNOSIS — R21 Rash and other nonspecific skin eruption: Secondary | ICD-10-CM | POA: Insufficient documentation

## 2022-07-22 DIAGNOSIS — Z760 Encounter for issue of repeat prescription: Secondary | ICD-10-CM | POA: Insufficient documentation

## 2022-07-22 MED ORDER — HYDROCORTISONE 1 % EX CREA
TOPICAL_CREAM | Freq: Once | CUTANEOUS | Status: AC
  Filled 2022-07-22: qty 28

## 2022-07-22 NOTE — ED Triage Notes (Signed)
Pt reported to ED requesting hydrocortisone cream for neck. States that he just wants to keep it "babied up".

## 2022-07-22 NOTE — ED Notes (Addendum)
Called pharmacy at this time for pt medications. Medication not in dept. tube systems.

## 2022-07-22 NOTE — ED Provider Notes (Signed)
[facility] EMERGENCY DEPARTMENT Provider Note   CSN: [id number] Arrival date & time: 07/22/22  4098     History  Chief Complaint  Patient presents with  . Medication Refill    Cole Barrett is a 36 y.o. male.  Pt complains of a rash on his neck.  Pt is requesting hydrocortisone.  Pt states he can't get it at the pharmacy.  Pt request to get here.   The history is provided by the patient. No language interpreter was used.  Medication Refill Medications/supplies requested:  Hydrocortisone cream Reason for request:  Clinic/provider not available Medications taken before: yes - see home medications   Patient has complete original prescription information: no        Home Medications Prior to Admission medications   Medication Sig Start Date End Date Taking? Authorizing Provider  bacitracin ointment Apply 1 application. topically 2 (two) times daily. 03/08/22   Henderly, Britni A, PA-C  hydrocortisone cream 1 % Apply 1 application. topically 2 (two) times daily as needed (For rash). 03/05/22   Renne Crigler, PA-C  hydrOXYzine (ATARAX) 25 MG tablet Take 1 tablet (25 mg total) by mouth 3 (three) times daily as needed for anxiety or itching. 12/23/21   Ardis Hughs, NP  mupirocin cream (BACTROBAN) 2 % Apply 1 Application topically 2 (two) times daily. 06/22/22   Karie Mainland, Amjad, PA-C  risperiDONE (RISPERDAL) 1 MG tablet Take 1 tablet (1 mg total) by mouth at bedtime. 01/18/22   Prosperi, Christian H, PA-C  amantadine (SYMMETREL) 100 MG capsule Take 1 capsule (100 mg total) by mouth 2 (two) times daily. Patient not taking: Reported on 11/04/2019 09/01/19 11/17/19  [name], NP      Allergies    Patient has no known allergies.    Review of Systems   Review of Systems  All other systems reviewed and are negative.   Physical Exam Updated Vital Signs BP 133/82 (BP Location: Right Arm)   Pulse 71   Temp 97.8 F (36.6 C) (Oral)   Resp 18   SpO2  97%  Physical Exam Vitals and nursing note reviewed.  Constitutional:      General: He is not in acute distress.    Appearance: He is well-developed.  HENT:     Head: Normocephalic and atraumatic.  Eyes:     Conjunctiva/sclera: Conjunctivae normal.  Cardiovascular:     Rate and Rhythm: Normal rate.  Pulmonary:     Effort: Pulmonary effort is normal.  Musculoskeletal:        General: No swelling.  Skin:    General: Skin is warm.     Capillary Refill: Capillary refill takes less than 2 seconds.     Findings: Erythema and rash present.     Comments: Raised dark rash neck    Neurological:     Mental Status: He is alert.  Psychiatric:        Mood and Affect: Mood normal.    ED Results / Procedures / Treatments   Labs (all labs ordered are listed, but only abnormal results are displayed) Labs Reviewed - No data to display  EKG None  Radiology No results found.  Procedures Procedures    Medications Ordered in ED Medications - No data to display  ED Course/ Medical Decision Making/ A&P                           Medical Decision Making Pt request  hydrocortisone   Risk Risk Details: Pt states he can not get medication at pharmacy.              Final Clinical Impression(s) / ED Diagnoses Final diagnoses:  None    Rx / DC Orders ED Discharge Orders     None         Osie Cheeks 07/22/22 1122    Tegeler, Canary Brim, MD 07/22/22 [phone]

## 2022-08-02 ENCOUNTER — Encounter (HOSPITAL_COMMUNITY): Payer: Self-pay | Admitting: Emergency Medicine

## 2022-08-02 ENCOUNTER — Emergency Department (HOSPITAL_COMMUNITY)
Admission: EM | Admit: 2022-08-02 | Discharge: 2022-08-02 | Disposition: A | Discharge status: Home / Self Care | Payer: Self-pay | Attending: Student | Admitting: Student

## 2022-08-02 DIAGNOSIS — Z760 Encounter for issue of repeat prescription: Secondary | ICD-10-CM | POA: Insufficient documentation

## 2022-08-02 MED ORDER — RISPERIDONE 1 MG PO TABS
1.0000 mg | ORAL_TABLET | Freq: Once | ORAL | Status: AC
  Administered 2022-08-02: 1 mg via ORAL
  Filled 2022-08-02: qty 1

## 2022-08-02 NOTE — ED Provider Notes (Signed)
[facility] EMERGENCY DEPARTMENT Provider Note   CSN: [id number] Arrival date & time: 08/02/22  1423     History  Chief Complaint  Patient presents with   Medication Refill    Cole Barrett is a 36 y.o. male.  HPI 36 year old male presenting to the ED requesting dose of his Risperdal.  He is seen almost nightly for same.  He has no acute complaints.  Denies SI/HI.    Home Medications Prior to Admission medications   Medication Sig Start Date End Date Taking? Authorizing Provider  bacitracin ointment Apply 1 application. topically 2 (two) times daily. 03/08/22   Henderly, Britni A, PA-C  hydrocortisone cream 1 % Apply 1 application. topically 2 (two) times daily as needed (For rash). 03/05/22   Renne Crigler, PA-C  hydrOXYzine (ATARAX) 25 MG tablet Take 1 tablet (25 mg total) by mouth 3 (three) times daily as needed for anxiety or itching. 12/23/21   Ardis Hughs, NP  mupirocin cream (BACTROBAN) 2 % Apply 1 Application topically 2 (two) times daily. 06/22/22   Karie Mainland, Amjad, PA-C  risperiDONE (RISPERDAL) 1 MG tablet Take 1 tablet (1 mg total) by mouth at bedtime. 01/18/22   Prosperi, Christian H, PA-C  amantadine (SYMMETREL) 100 MG capsule Take 1 capsule (100 mg total) by mouth 2 (two) times daily. Patient not taking: Reported on 11/04/2019 09/01/19 11/17/19  [name], NP      Allergies    Patient has no known allergies.    Review of Systems   Review of Systems Please refer to the HPI Physical Exam Updated Vital Signs BP 137/76 (BP Location: Right Arm)   Pulse 76   Temp 98.3 F (36.8 C) (Oral)   Resp 15   Ht 5\' 10"  (1.778 m)   Wt 99.8 kg   SpO2 98%   BMI 31.57 kg/m  Physical Exam Vitals and nursing note reviewed.  Constitutional:      General: He is not in acute distress.    Appearance: He is well-developed. He is not ill-appearing or toxic-appearing.  HENT:     Head: Normocephalic and atraumatic.  Eyes:     General: No scleral  icterus.       Right eye: No discharge.        Left eye: No discharge.  Pulmonary:     Effort: No respiratory distress.  Musculoskeletal:        General: Normal range of motion.     Cervical back: Normal range of motion.  Skin:    General: Skin is warm and dry.     Capillary Refill: Capillary refill takes less than 2 seconds.     Coloration: Skin is not pale.  Neurological:     Mental Status: He is alert and oriented to person, place, and time.  Psychiatric:        Mood and Affect: Mood normal.        Behavior: Behavior normal.        Thought Content: Thought content normal.        Judgment: Judgment normal.     ED Results / Procedures / Treatments   Labs (all labs ordered are listed, but only abnormal results are displayed) Labs Reviewed - No data to display  EKG None  Radiology No results found.  Procedures Procedures    Medications Ordered in ED Medications  risperiDONE (RISPERDAL) tablet 1 mg (has no administration in time range)    ED Course/ Medical Decision Making/ A&P  Medical Decision Making Risk Prescription drug management.   Patient requesting dose of his Risperdal.  Cannot get to his medications today.  Does have them at home.  Denies any SI or HI complaints.  Vital signs reassuring.  Patient encouraged to follow back up with his primary care doctor.        Final Clinical Impression(s) / ED Diagnoses Final diagnoses:  Medication refill    Rx / DC Orders ED Discharge Orders     None         Wallace Keller 08/02/22 1517    Gerhard Munch, MD 08/03/22 1029

## 2022-08-02 NOTE — ED Triage Notes (Signed)
Pt needs refill for Risperdal.

## 2022-08-13 ENCOUNTER — Encounter (HOSPITAL_COMMUNITY): Payer: Self-pay | Admitting: Emergency Medicine

## 2022-08-13 ENCOUNTER — Emergency Department (HOSPITAL_COMMUNITY)
Admission: EM | Admit: 2022-08-13 | Discharge: 2022-08-13 | Disposition: A | Discharge status: Home / Self Care | Payer: Self-pay | Attending: Emergency Medicine | Admitting: Emergency Medicine

## 2022-08-13 ENCOUNTER — Other Ambulatory Visit: Payer: Self-pay

## 2022-08-13 DIAGNOSIS — R443 Hallucinations, unspecified: Secondary | ICD-10-CM

## 2022-08-13 DIAGNOSIS — R440 Auditory hallucinations: Secondary | ICD-10-CM | POA: Insufficient documentation

## 2022-08-13 MED ORDER — RISPERIDONE 1 MG PO TABS
1.0000 mg | ORAL_TABLET | Freq: Once | ORAL | Status: AC
  Administered 2022-08-13: 1 mg via ORAL
  Filled 2022-08-13 (×2): qty 1

## 2022-08-13 NOTE — ED Notes (Signed)
Pt verbalized understanding of discharge paperwork and follow-up care.  °

## 2022-08-13 NOTE — ED Triage Notes (Signed)
Pt reports he has been having auditory hallucinations x 2-3 days; denies HI/SI

## 2022-08-13 NOTE — Discharge Instructions (Signed)
Your history evaluation here overall reassuring.  Given your lack of other concerning thoughts such as suicidal ideation or homicidal ideation, we do feel you are safe for discharge home.  You received a dose of your medication and please continue taking your home medications.  If any symptoms change or worsen acutely, please return to the nearest emergency department.  This follow-up with your primary psychiatric team.

## 2022-08-13 NOTE — ED Notes (Signed)
Pt refusing labs

## 2022-08-13 NOTE — ED Provider Notes (Signed)
[facility] EMERGENCY DEPARTMENT Provider Note   CSN: [id number] Arrival date & time: 08/13/22  0413     History  Chief Complaint  Patient presents with   Hallucinations    Cole Barrett is a 36 y.o. male.  The history is provided by the patient and medical records. No language interpreter was used.  Mental Health Problem Presenting symptoms: hallucinations   Presenting symptoms: no aggressive behavior, no agitation, no delusions, no homicidal ideas, no paranoid behavior, no suicidal thoughts, no suicidal threats and no suicide attempt   Degree of incapacity (severity):  Moderate Onset quality:  Gradual Duration:  3 days Timing:  Constant Progression:  Unchanged Chronicity:  Recurrent Treatment compliance:  Untreated Relieved by:  Nothing Ineffective treatments:  None tried Associated symptoms: no abdominal pain, no chest pain, no fatigue and no headaches   Risk factors: hx of mental illness        Home Medications Prior to Admission medications   Medication Sig Start Date End Date Taking? Authorizing Provider  bacitracin ointment Apply 1 application. topically 2 (two) times daily. 03/08/22   Henderly, Britni A, PA-C  hydrocortisone cream 1 % Apply 1 application. topically 2 (two) times daily as needed (For rash). 03/05/22   Carlisle Cater, PA-C  hydrOXYzine (ATARAX) 25 MG tablet Take 1 tablet (25 mg total) by mouth 3 (three) times daily as needed for anxiety or itching. 12/23/21   Revonda Humphrey, NP  mupirocin cream (BACTROBAN) 2 % Apply 1 Application topically 2 (two) times daily. 06/22/22   Deatra Canter, Amjad, PA-C  risperiDONE (RISPERDAL) 1 MG tablet Take 1 tablet (1 mg total) by mouth at bedtime. 01/18/22   Prosperi, Christian H, PA-C  amantadine (SYMMETREL) 100 MG capsule Take 1 capsule (100 mg total) by mouth 2 (two) times daily. Patient not taking: Reported on 11/04/2019 09/01/19 11/17/19  Mallie Darting, NP      Allergies    Patient has no  known allergies.    Review of Systems   Review of Systems  Constitutional:  Negative for chills, fatigue and fever.  HENT:  Negative for congestion.   Respiratory:  Negative for cough, chest tightness, shortness of breath and wheezing.   Cardiovascular:  Negative for chest pain.  Gastrointestinal:  Negative for abdominal pain, constipation, diarrhea, nausea and vomiting.  Genitourinary:  Negative for dysuria.  Musculoskeletal:  Negative for back pain and neck pain.  Skin:  Negative for rash and wound.  Neurological:  Negative for light-headedness and headaches.  Psychiatric/Behavioral:  Positive for hallucinations. Negative for agitation, confusion, homicidal ideas, paranoia and suicidal ideas.   All other systems reviewed and are negative.   Physical Exam Updated Vital Signs BP 123/69 (BP Location: Left Arm)   Pulse 84   Temp 98.6 F (37 C) (Oral)   Resp 17   Ht 5\' 10"  (1.778 m)   Wt 99.8 kg   SpO2 98%   BMI 31.57 kg/m  Physical Exam Vitals and nursing note reviewed.  Constitutional:      General: He is not in acute distress.    Appearance: He is well-developed. He is not ill-appearing, toxic-appearing or diaphoretic.  HENT:     Head: Normocephalic and atraumatic.     Nose: No congestion or rhinorrhea.     Mouth/Throat:     Mouth: Mucous membranes are moist.  Eyes:     Extraocular Movements: Extraocular movements intact.     Conjunctiva/sclera: Conjunctivae normal.     Pupils: Pupils are  equal, round, and reactive to light.  Cardiovascular:     Rate and Rhythm: Normal rate and regular rhythm.     Heart sounds: No murmur heard. Pulmonary:     Effort: Pulmonary effort is normal. No respiratory distress.     Breath sounds: Normal breath sounds. No wheezing, rhonchi or rales.  Chest:     Chest wall: No tenderness.  Abdominal:     Palpations: Abdomen is soft.     Tenderness: There is no abdominal tenderness. There is no right CVA tenderness, left CVA tenderness,  guarding or rebound.  Musculoskeletal:        General: No swelling or tenderness.     Cervical back: Neck supple. No tenderness.  Skin:    General: Skin is warm and dry.     Capillary Refill: Capillary refill takes less than 2 seconds.     Findings: No erythema or rash.  Neurological:     General: No focal deficit present.     Mental Status: He is alert.     Sensory: No sensory deficit.     Motor: No weakness.  Psychiatric:        Attention and Perception: He perceives auditory hallucinations.        Thought Content: Thought content is not paranoid or delusional. Thought content does not include homicidal or suicidal ideation. Thought content does not include homicidal or suicidal plan.     ED Results / Procedures / Treatments   Labs (all labs ordered are listed, but only abnormal results are displayed) Labs Reviewed  RESP PANEL BY RT-PCR (FLU A&B, COVID) ARPGX2  COMPREHENSIVE METABOLIC PANEL  ETHANOL  RAPID URINE DRUG SCREEN, HOSP PERFORMED  CBC WITH DIFFERENTIAL/PLATELET  ACETAMINOPHEN LEVEL  SALICYLATE LEVEL    EKG None  Radiology No results found.  Procedures Procedures    Medications Ordered in ED Medications - No data to display  ED Course/ Medical Decision Making/ A&P                           Medical Decision Making Risk Prescription drug management.    Cole Barrett is a 36 y.o. male with a past medical history significant for previous pulmonary emboli, schizoaffective disorder, schizophrenia, and previous psychosis who presents with auditory hallucinations and not having access to his Risperdal.  Reports last 2 days he has not his medication and did not take it and he has had the hallucinations.  He reports no SI or HI and denies any other physical complaints.  Otherwise denies fevers, chills, congestion, cough, nausea, vomiting, constipation, diarrhea, or urinary changes.  Denies any trauma.  Denies any other problems.  He just wants his  dose of Risperdal and would like to go home.  On my exam, lungs clear and chest nontender.  Abdomen nontender.  Patient moving all extremities.  Symmetric smile.  Clear speech.  Pupils are symmetric and reactive.  Patient is resting comfortably.  He has no other complaints on exam.  Given the patient's mild altered hallucinations and lack of SI or HI, do not feel needs TTS at this time.  We will give him a dose of Risperdal and let him eat and drink.  If he passes a p.o. challenge and is feeling better, anticipate discharge to continue outpatient management with the psychiatry team.  The patient appears well after getting the dose of medication, anticipate discharge.  Patient ambulated normally and had no complaints.  He agrees with  discharge.        Final Clinical Impression(s) / ED Diagnoses Final diagnoses:  Hallucinations    Rx / DC Orders ED Discharge Orders     None       Clinical Impression: 1. Hallucinations     Disposition: Discharge  Condition: Good  I have discussed the results, Dx and Tx plan with the pt(& family if present). He/she/they expressed understanding and agree(s) with the plan. Discharge instructions discussed at great length. Strict return precautions discussed and pt &/or family have verbalized understanding of the instructions. No further questions at time of discharge.    New Prescriptions   No medications on file    Follow Up: [facility] [address] [place] [address] [phone] Follow up PLEASE CALL ON MONDAY AND MAKE A APPOINTMENT TO ESTABLISH A PRIMARY CARE DOCTOR. they have finacial counselling and a pharmacy      Kadin Bera, Canary Brim, MD 08/13/22 1042

## 2022-08-16 ENCOUNTER — Other Ambulatory Visit: Payer: Self-pay

## 2022-08-16 ENCOUNTER — Encounter (HOSPITAL_COMMUNITY): Payer: Self-pay | Admitting: Emergency Medicine

## 2022-08-16 ENCOUNTER — Emergency Department (HOSPITAL_COMMUNITY)
Admission: EM | Admit: 2022-08-16 | Discharge: 2022-08-16 | Disposition: A | Discharge status: Home / Self Care | Payer: Self-pay | Attending: Student | Admitting: Student

## 2022-08-16 DIAGNOSIS — Z760 Encounter for issue of repeat prescription: Secondary | ICD-10-CM | POA: Insufficient documentation

## 2022-08-16 MED ORDER — RISPERIDONE 1 MG PO TABS
1.0000 mg | ORAL_TABLET | Freq: Once | ORAL | Status: AC
  Administered 2022-08-16: 1 mg via ORAL
  Filled 2022-08-16: qty 1

## 2022-08-16 NOTE — ED Provider Notes (Signed)
[facility] EMERGENCY DEPARTMENT Provider Note   CSN: [id number] Arrival date & time: 08/16/22  9924     History  Chief Complaint  Patient presents with   Medication Refill    Cole Barrett is a 36 y.o. male.  HPI   Medical history including paranoia, PE, homelessness, schizophrenia presents with complaints of medications refill.  Patient states that is here because he needs a dose of his risperidone, states that he is working across town is unable to get his own medication, he has no other complaints this time, does denies suicidal homicidal ideations denies hallucinations or delusions denies any illicit drug use, no chest pain no shortness of breath no stomach pain no nausea or vomiting.  Reviewed patient's chart he is seen here frequently for similar presentations.   Home Medications Prior to Admission medications   Medication Sig Start Date End Date Taking? Authorizing Provider  bacitracin ointment Apply 1 application. topically 2 (two) times daily. 03/08/22   Henderly, Britni A, PA-C  hydrocortisone cream 1 % Apply 1 application. topically 2 (two) times daily as needed (For rash). 03/05/22   Renne Crigler, PA-C  hydrOXYzine (ATARAX) 25 MG tablet Take 1 tablet (25 mg total) by mouth 3 (three) times daily as needed for anxiety or itching. 12/23/21   Ardis Hughs, NP  mupirocin cream (BACTROBAN) 2 % Apply 1 Application topically 2 (two) times daily. 06/22/22   Karie Mainland, Amjad, PA-C  risperiDONE (RISPERDAL) 1 MG tablet Take 1 tablet (1 mg total) by mouth at bedtime. 01/18/22   Prosperi, Christian H, PA-C  amantadine (SYMMETREL) 100 MG capsule Take 1 capsule (100 mg total) by mouth 2 (two) times daily. Patient not taking: Reported on 11/04/2019 09/01/19 11/17/19  [name], NP      Allergies    Patient has no known allergies.    Review of Systems   Review of Systems  Constitutional:  Negative for chills and fever.  Respiratory:  Negative for  shortness of breath.   Cardiovascular:  Negative for chest pain.  Gastrointestinal:  Negative for abdominal pain.  Neurological:  Negative for headaches.    Physical Exam Updated Vital Signs BP (!) 149/95 (BP Location: Right Arm)   Pulse 72   Temp 98 F (36.7 C) (Oral)   Resp 18   Ht 5\' 10"  (1.778 m)   Wt 99.8 kg   SpO2 100%   BMI 31.57 kg/m  Physical Exam Vitals and nursing note reviewed.  Constitutional:      General: He is not in acute distress.    Appearance: He is not ill-appearing.  HENT:     Head: Normocephalic and atraumatic.     Comments: There is no deformity the head present no raccoon eyes or battle sign noted.    Nose: No congestion.  Eyes:     Extraocular Movements: Extraocular movements intact.     Conjunctiva/sclera: Conjunctivae normal.  Cardiovascular:     Rate and Rhythm: Normal rate and regular rhythm.     Pulses: Normal pulses.  Pulmonary:     Effort: Pulmonary effort is normal.  Skin:    General: Skin is warm and dry.  Neurological:     Mental Status: He is alert.     Comments: No facial asymmetry no difficulty with word finding following two-step commands no unilateral weakness present.  Gait fully intact.  Psychiatric:        Mood and Affect: Mood normal.     Comments: Patient is  alert and orient x4, denies suicidal homicidal ideations, he has good insight, does not appear to be respond to internal stimuli.     ED Results / Procedures / Treatments   Labs (all labs ordered are listed, but only abnormal results are displayed) Labs Reviewed - No data to display  EKG None  Radiology No results found.  Procedures Procedures    Medications Ordered in ED Medications  risperiDONE (RISPERDAL) tablet 1 mg (1 mg Oral Given 08/16/22 0553)    ED Course/ Medical Decision Making/ A&P                           Medical Decision Making Risk Prescription drug management.   This patient presents to the ED for concern of medication refill,  this involves an extensive number of treatment options, and is a complaint that carries with it a high risk of complications and morbidity.  The differential diagnosis includes withdrawals, psychiatric emergency,    Additional history obtained:  Additional history obtained from N/A External records from outside source obtained and reviewed including previous ED notes   Co morbidities that complicate the patient evaluation  Schizophrenia  Social Determinants of Health:  Homeless   Lab Tests:  I Ordered, and personally interpreted labs.  The pertinent results include: N/A   Imaging Studies ordered:  I ordered imaging studies including N/A I independently visualized and interpreted imaging which showed N/A I agree with the radiologist interpretation   Cardiac Monitoring:  The patient was maintained on a cardiac monitor.  I personally viewed and interpreted the cardiac monitored which showed an underlying rhythm of: N/A   Medicines ordered and prescription drug management:  I ordered medication including risperidone I have reviewed the patients home medicines and have made adjustments as needed  Critical Interventions:  N/A   Reevaluation:  Presents needing medication refill, will provide him with his dose of risperidone and he is agreement plan discharge.  Consultations Obtained:  N/A    Test Considered:  N/A    Rule out Doubt psychiatric emergency does not endorse suicidal homicidal ideations does not appear to be respond to internal stimuli, has good insight.  I have low suspicion for withdrawals nontremulous on my exam vital signs reassuring.    Dispostion and problem list  After consideration of the diagnostic results and the patients response to treatment, I feel that the patent would benefit from discharge.  Medication refill-given dose of his risperidone, advised him that he should follow-up with behavior health urgent care for further  evaluation, also given resources for shelters within the area.            Final Clinical Impression(s) / ED Diagnoses Final diagnoses:  Medication refill    Rx / DC Orders ED Discharge Orders     None         [name], PA-C 08/16/22 0604    Fatima Blank, MD 08/16/22 [phone]

## 2022-08-16 NOTE — Discharge Instructions (Signed)
I have given you information for the behavioral health urgent care you may follow-up with them for further management of your medication needs.  I have also given you information for shelters within the area please review  Come back to the emergency department if you develop chest pain, shortness of breath, severe abdominal pain, uncontrolled nausea, vomiting, diarrhea.

## 2022-08-16 NOTE — ED Triage Notes (Addendum)
"  I just need my medications.  Everything is going good."  Pt is pleasant and eating his bag of chips in triage.

## 2022-08-17 ENCOUNTER — Emergency Department (HOSPITAL_COMMUNITY)
Admission: EM | Admit: 2022-08-17 | Discharge: 2022-08-17 | Disposition: A | Discharge status: Home / Self Care | Payer: Self-pay | Attending: Emergency Medicine | Admitting: Emergency Medicine

## 2022-08-17 DIAGNOSIS — T07XXXA Unspecified multiple injuries, initial encounter: Secondary | ICD-10-CM

## 2022-08-17 DIAGNOSIS — S40811A Abrasion of right upper arm, initial encounter: Secondary | ICD-10-CM | POA: Insufficient documentation

## 2022-08-17 DIAGNOSIS — F424 Excoriation (skin-picking) disorder: Secondary | ICD-10-CM

## 2022-08-17 DIAGNOSIS — R21 Rash and other nonspecific skin eruption: Secondary | ICD-10-CM | POA: Insufficient documentation

## 2022-08-17 DIAGNOSIS — Z760 Encounter for issue of repeat prescription: Secondary | ICD-10-CM

## 2022-08-17 DIAGNOSIS — X58XXXA Exposure to other specified factors, initial encounter: Secondary | ICD-10-CM | POA: Insufficient documentation

## 2022-08-17 MED ORDER — BACITRACIN ZINC 500 UNIT/GM EX OINT
TOPICAL_OINTMENT | Freq: Two times a day (BID) | CUTANEOUS | Status: DC
  Administered 2022-08-17: 1 via TOPICAL
  Filled 2022-08-17: qty 2.7

## 2022-08-17 MED ORDER — RISPERIDONE 1 MG PO TABS
1.0000 mg | ORAL_TABLET | Freq: Once | ORAL | Status: AC
  Administered 2022-08-17: 1 mg via ORAL
  Filled 2022-08-17: qty 1

## 2022-08-17 MED ORDER — BACITRACIN ZINC 500 UNIT/GM EX OINT: 1.0000 | TOPICAL_OINTMENT | Freq: Two times a day (BID) | CUTANEOUS | 0 refills | Status: DC

## 2022-08-17 NOTE — Discharge Instructions (Addendum)
Please follow-up with your psychiatrist for your respite all, and return to the ER if you have red streaking up your arms, or if your wounds become hot and tender to the touch.

## 2022-08-17 NOTE — ED Provider Notes (Cosign Needed Addendum)
[facility]-EMERGENCY DEPT Provider Note   CSN: [id number] Arrival date & time: 08/17/22  2142     History  No chief complaint on file.   Cole Barrett is a 36 y.o. male, hx of paranoid schizophrenia, skin picking.  Here for refill of his Risperdal.  He states that he is across town and he is unable to get it, and needs it for his hallucinations.  He says hallucinations are the same as usual, denies any SI/HI.  States he just needs his medication.    Also here because he is out of bacitracin.  He states that he is still picking his skin.  Denies any fevers or chills.        Home Medications Prior to Admission medications   Medication Sig Start Date End Date Taking? Authorizing Provider  bacitracin ointment Apply 1 Application topically 2 (two) times daily for 7 days. 08/17/22 08/24/22 Yes Norvell Caswell L, PA  bacitracin ointment Apply 1 application. topically 2 (two) times daily. 03/08/22   Henderly, Britni A, PA-C  hydrocortisone cream 1 % Apply 1 application. topically 2 (two) times daily as needed (For rash). 03/05/22   Renne Crigler, PA-C  hydrOXYzine (ATARAX) 25 MG tablet Take 1 tablet (25 mg total) by mouth 3 (three) times daily as needed for anxiety or itching. 12/23/21   Ardis Hughs, NP  mupirocin cream (BACTROBAN) 2 % Apply 1 Application topically 2 (two) times daily. 06/22/22   Karie Mainland, Amjad, PA-C  risperiDONE (RISPERDAL) 1 MG tablet Take 1 tablet (1 mg total) by mouth at bedtime. 01/18/22   Prosperi, Christian H, PA-C  amantadine (SYMMETREL) 100 MG capsule Take 1 capsule (100 mg total) by mouth 2 (two) times daily. Patient not taking: Reported on 11/04/2019 09/01/19 11/17/19  [name], NP      Allergies    Patient has no known allergies.    Review of Systems   Review of Systems  Skin:  Positive for wound.  Psychiatric/Behavioral:  Positive for hallucinations.     Physical Exam Updated Vital Signs BP (!) 147/83 (BP Location:  Left Arm)   Pulse 75   Temp 98.7 F (37.1 C) (Oral)   Resp 18   SpO2 97%  Physical Exam Vitals and nursing note reviewed.  Constitutional:      Comments: unkempt  HENT:     Head: Normocephalic and atraumatic.     Nose: Nose normal.     Mouth/Throat:     Mouth: Mucous membranes are moist.  Eyes:     Extraocular Movements: Extraocular movements intact.     Conjunctiva/sclera: Conjunctivae normal.     Pupils: Pupils are equal, round, and reactive to light.  Cardiovascular:     Rate and Rhythm: Normal rate and regular rhythm.  Pulmonary:     Effort: Pulmonary effort is normal.     Breath sounds: Normal breath sounds.  Abdominal:     General: Abdomen is flat. Bowel sounds are normal.     Palpations: Abdomen is soft.  Musculoskeletal:        General: Normal range of motion.     Cervical back: Normal range of motion and neck supple.  Skin:    General: Skin is warm and dry.     Capillary Refill: Capillary refill takes less than 2 seconds.     Comments: +multiple [name] superficial wounds/abrasions to R arm  Neurological:     General: No focal deficit present.     Mental Status: He  is alert.  Psychiatric:        Attention and Perception: Attention normal.        Mood and Affect: Affect is blunt.        Speech: Speech normal.        Behavior: Behavior is cooperative.     ED Results / Procedures / Treatments   Labs (all labs ordered are listed, but only abnormal results are displayed) Labs Reviewed - No data to display  EKG None  Radiology No results found.  Procedures Procedures   Medications Ordered in ED Medications  risperiDONE (RISPERDAL) tablet 1 mg (has no administration in time range)  bacitracin ointment (has no administration in time range)    ED Course/ Medical Decision Making/ A&P                           Medical Decision Making Risk OTC drugs. Prescription drug management.   Is a 36 year old male, history of paranoid schizophrenia, here for  his psychiatric medication.  He states that it is across town he needs it.  Denies any SI/HI.  Endorses auditory hallucinations, but states this is normal for him.  Reviewed history appears to be normal for him.  Also states that he has been picking his skin again, and needs some bacitracin cream since he is out of it.  Denies any fevers or chills.  Appears to be like excoriated skin.  Bacitracin provided.  Risperdal provided return symptoms emphasized.  Final Clinical Impression(s) / ED Diagnoses Final diagnoses:  Picking own skin  Medication refill  Abrasions of multiple sites    Rx / DC Orders ED Discharge Orders          Ordered    bacitracin ointment  2 times daily        08/17/22 2303              Mykal Kirchman, Si Gaul, PA 08/17/22 2303    Osvaldo Shipper, [place] 08/17/22 2304    Godfrey Pick, MD 08/18/22 [phone]

## 2022-08-17 NOTE — ED Triage Notes (Signed)
Pt requesting risperdol shot. Also complaining of rash.

## 2022-08-21 ENCOUNTER — Encounter (HOSPITAL_COMMUNITY): Payer: Self-pay

## 2022-08-21 ENCOUNTER — Emergency Department (HOSPITAL_COMMUNITY)
Admission: EM | Admit: 2022-08-21 | Discharge: 2022-08-21 | Disposition: A | Discharge status: Home / Self Care | Payer: Self-pay | Attending: Emergency Medicine | Admitting: Emergency Medicine

## 2022-08-21 ENCOUNTER — Other Ambulatory Visit: Payer: Self-pay

## 2022-08-21 DIAGNOSIS — X58XXXA Exposure to other specified factors, initial encounter: Secondary | ICD-10-CM | POA: Insufficient documentation

## 2022-08-21 DIAGNOSIS — Z760 Encounter for issue of repeat prescription: Secondary | ICD-10-CM | POA: Insufficient documentation

## 2022-08-21 DIAGNOSIS — S1091XA Abrasion of unspecified part of neck, initial encounter: Secondary | ICD-10-CM | POA: Insufficient documentation

## 2022-08-21 DIAGNOSIS — F424 Excoriation (skin-picking) disorder: Secondary | ICD-10-CM | POA: Insufficient documentation

## 2022-08-21 MED ORDER — BACITRACIN ZINC 500 UNIT/GM EX OINT: 1.0000 | TOPICAL_OINTMENT | Freq: Two times a day (BID) | CUTANEOUS | 0 refills | Status: DC

## 2022-08-21 MED ORDER — RISPERIDONE 1 MG PO TABS
1.0000 mg | ORAL_TABLET | Freq: Once | ORAL | Status: AC
  Administered 2022-08-21: 1 mg via ORAL
  Filled 2022-08-21: qty 1

## 2022-08-21 MED ORDER — BACITRACIN ZINC 500 UNIT/GM EX OINT
TOPICAL_OINTMENT | Freq: Two times a day (BID) | CUTANEOUS | Status: DC
  Administered 2022-08-21: 1 via TOPICAL

## 2022-08-21 NOTE — ED Notes (Signed)
Patient verbalizes understanding of discharge instructions. Opportunity for questioning and answers were provided. Arm band removed by staff, patient discharged from ED. 

## 2022-08-21 NOTE — Discharge Instructions (Addendum)
Gave you a one-time dose of risperidone today in the emergency department.  Also treated your neck abrasion with bacitracin and ordered some bacitracin to you for you to pick up at pharmacy to be applied to wounds related to your skin picking.  When that you follow-up with your PCP and/or psychiatrist regarding ongoing hallucinations and intermittent use of risperidone.  As a reminder, adverse effects of risperidone are but not limited to seizure, difficulty breathing, fast heartbeat, high fever, high or low blood pressure and severe muscle stiffness.  If you start experience any of those symptoms, please return to the emergency department for further evaluation.  Also if your hallucinations or your own thoughts lead you to have suicidal or homicidal thoughts please return to the emergency department for further evaluation.

## 2022-08-21 NOTE — ED Triage Notes (Signed)
Patient here requesting risperdal refill. No complaints, alert and oriented

## 2022-08-21 NOTE — ED Provider Notes (Signed)
[facility] EMERGENCY DEPARTMENT Provider Note   CSN: [id number] Arrival date & time: 08/21/22  7829     History  No chief complaint on file.  HPI Cole Barrett is a 36 y.o. male with history of paranoid schizophrenia and skin picking.  Patient presenting for medication refill of Risperdal.  States that he was unable to get acquire his refill because he was "stopped by something and unable to get across town".  Endorsing that he needs his medication because he is hearing voices.  Denies SI/HI.  Mention he has some new abrasions about his neck, discussed his history of skin picking.  Also mentioned that he is out of his bacitracin that he normally applies to his wounds related to skin picking.  Denies fever, nausea, vomiting, and diarrhea.  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      Home Medications Prior to Admission medications   Medication Sig Start Date End Date Taking? Authorizing Provider  bacitracin ointment Apply 1 Application topically 2 (two) times daily. 08/21/22  Yes Harriet Pho, PA-C  hydrocortisone cream 1 % Apply 1 application. topically 2 (two) times daily as needed (For rash). 03/05/22   Carlisle Cater, PA-C  hydrOXYzine (ATARAX) 25 MG tablet Take 1 tablet (25 mg total) by mouth 3 (three) times daily as needed for anxiety or itching. 12/23/21   Revonda Humphrey, NP  mupirocin cream (BACTROBAN) 2 % Apply 1 Application topically 2 (two) times daily. 06/22/22   Deatra Canter, Amjad, PA-C  risperiDONE (RISPERDAL) 1 MG tablet Take 1 tablet (1 mg total) by mouth at bedtime. 01/18/22   Prosperi, Christian H, PA-C  amantadine (SYMMETREL) 100 MG capsule Take 1 capsule (100 mg total) by mouth 2 (two) times daily. Patient not taking: Reported on 11/04/2019 09/01/19 11/17/19  Mallie Darting, NP      Allergies    Patient has no known allergies.    Review of Systems   Review of Systems   Skin:  Positive for wound.  Psychiatric/Behavioral:  Positive for hallucinations.     Physical Exam Updated Vital Signs BP 122/74   Pulse 90   Temp 97.7 F (36.5 C) (Oral)   Resp 16   SpO2 96%  Physical Exam Constitutional:      Appearance: Normal appearance.     Comments: Tired  HENT:     Head: Normocephalic.     Nose: Nose normal.  Eyes:     Conjunctiva/sclera: Conjunctivae normal.  Cardiovascular:     Rate and Rhythm: Normal rate and regular rhythm.  Pulmonary:     Effort: Pulmonary effort is normal.     Breath sounds: Normal breath sounds.  Skin:    Comments: Superficial abrasions around his neck, appear new but are not actively bleeding  Neurological:     Mental Status: He is alert.  Psychiatric:        Mood and Affect: Mood normal.     ED Results / Procedures / Treatments   Labs (all labs ordered are listed, but only abnormal results are displayed) Labs Reviewed - No data to display  EKG None  Radiology No results found.  Procedures Procedures    Medications Ordered in ED Medications  bacitracin ointment (has no administration in time range)  risperiDONE (RISPERDAL) tablet 1 mg (has no administration in time range)    ED Course/ Medical Decision Making/ A&P  Medical Decision Making  Presented for risperidone refill.  Per chart review, he has been seen here in the ED for the same complaint times along with abrasions related to his history of skin picking.  Applied bacitracin to what appeared to be skin picking abrasions about his neck. Ordered 1 time dose of risperidone.  Discussed adverse effects of risperidone and return precautions.  Also refilled his bacitracin for skin picking abrasions.  Also provided him a sandwich and soda before discharge.        Final Clinical Impression(s) / ED Diagnoses Final diagnoses:  Medication refill  Picking own skin  Abrasion of neck, initial encounter    Rx / DC Orders ED  Discharge Orders          Ordered    bacitracin ointment  2 times daily        08/21/22 0842              Harriet Pho, PA-C 08/21/22 [id number]    Blanchie Dessert, MD 08/24/22 1327

## 2022-08-24 ENCOUNTER — Emergency Department (HOSPITAL_COMMUNITY)
Admission: EM | Admit: 2022-08-24 | Discharge: 2022-08-24 | Disposition: A | Discharge status: Home / Self Care | Payer: Self-pay | Attending: Emergency Medicine | Admitting: Emergency Medicine

## 2022-08-24 DIAGNOSIS — Z760 Encounter for issue of repeat prescription: Secondary | ICD-10-CM | POA: Insufficient documentation

## 2022-08-24 MED ORDER — RISPERIDONE 1 MG PO TABS
1.0000 mg | ORAL_TABLET | Freq: Once | ORAL | Status: AC
  Administered 2022-08-24: 1 mg via ORAL
  Filled 2022-08-24: qty 1

## 2022-08-24 NOTE — ED Provider Notes (Signed)
[facility] EMERGENCY DEPARTMENT Provider Note   CSN: [id number] Arrival date & time: 08/24/22  0355     History  Chief Complaint  Patient presents with   Medication Refill    Cole Barrett is a 36 y.o. male.  The history is provided by the patient and medical records.   36 y.o. M here requesting dose of his risperdal.  Seen here almost nightly for same.  No acute complaints. No SI/HI.  Home Medications Prior to Admission medications   Medication Sig Start Date End Date Taking? Authorizing Provider  bacitracin ointment Apply 1 Application topically 2 (two) times daily. 08/21/22   Gareth Eagle, PA-C  hydrocortisone cream 1 % Apply 1 application. topically 2 (two) times daily as needed (For rash). 03/05/22   Renne Crigler, PA-C  hydrOXYzine (ATARAX) 25 MG tablet Take 1 tablet (25 mg total) by mouth 3 (three) times daily as needed for anxiety or itching. 12/23/21   Ardis Hughs, NP  mupirocin cream (BACTROBAN) 2 % Apply 1 Application topically 2 (two) times daily. 06/22/22   Karie Mainland, Amjad, PA-C  risperiDONE (RISPERDAL) 1 MG tablet Take 1 tablet (1 mg total) by mouth at bedtime. 01/18/22   Prosperi, Christian H, PA-C  amantadine (SYMMETREL) 100 MG capsule Take 1 capsule (100 mg total) by mouth 2 (two) times daily. Patient not taking: Reported on 11/04/2019 09/01/19 11/17/19  [name], NP      Allergies    Patient has no known allergies.    Review of Systems   Review of Systems  Psychiatric/Behavioral:         Needs medication  All other systems reviewed and are negative.   Physical Exam Updated Vital Signs BP (!) 140/73 (BP Location: Right Arm)   Pulse 82   Temp 97.9 F (36.6 C) (Oral)   Resp 18   SpO2 95%   Physical Exam Vitals and nursing note reviewed.  Constitutional:      Appearance: He is well-developed.  HENT:     Head: Normocephalic and atraumatic.  Eyes:     Conjunctiva/sclera: Conjunctivae normal.     Pupils: Pupils  are equal, round, and reactive to light.  Cardiovascular:     Rate and Rhythm: Normal rate and regular rhythm.     Heart sounds: Normal heart sounds.  Pulmonary:     Effort: Pulmonary effort is normal.     Breath sounds: Normal breath sounds.  Musculoskeletal:        General: Normal range of motion.     Cervical back: Normal range of motion.  Skin:    General: Skin is warm and dry.  Neurological:     Mental Status: He is alert and oriented to person, place, and time.  Psychiatric:     Comments: Denies SI/HI     ED Results / Procedures / Treatments   Labs (all labs ordered are listed, but only abnormal results are displayed) Labs Reviewed - No data to display  EKG None  Radiology No results found.  Procedures Procedures    Medications Ordered in ED Medications  risperiDONE (RISPERDAL) tablet 1 mg (has no administration in time range)    ED Course/ Medical Decision Making/ A&P                           Medical Decision Making Risk Prescription drug management.   36 y.o. M here requesting his Risperdal.  Appears at his baseline,  no acute complaints.  No SI/HI.  Medication given.  Stable for discharge.  Final Clinical Impression(s) / ED Diagnoses Final diagnoses:  Medication refill    Rx / DC Orders ED Discharge Orders     None         Larene Pickett, PA-C 08/24/22 0429    Ripley Fraise, MD 08/24/22 [phone]

## 2022-08-24 NOTE — ED Triage Notes (Signed)
Pt requesting Risperdal dose.

## 2022-09-04 ENCOUNTER — Emergency Department (HOSPITAL_COMMUNITY)
Admission: EM | Admit: 2022-09-04 | Discharge: 2022-09-05 | Discharge status: Against Medical Advice | Payer: Self-pay | Attending: Emergency Medicine | Admitting: Emergency Medicine

## 2022-09-04 ENCOUNTER — Encounter (HOSPITAL_COMMUNITY): Payer: Self-pay

## 2022-09-04 ENCOUNTER — Other Ambulatory Visit: Payer: Self-pay

## 2022-09-04 DIAGNOSIS — R21 Rash and other nonspecific skin eruption: Secondary | ICD-10-CM | POA: Insufficient documentation

## 2022-09-04 DIAGNOSIS — Z5321 Procedure and treatment not carried out due to patient leaving prior to being seen by health care provider: Secondary | ICD-10-CM | POA: Insufficient documentation

## 2022-09-04 NOTE — ED Triage Notes (Signed)
Patient here requesting cortisone cream for rash to neck and abdomen, denies pain

## 2022-09-04 NOTE — ED Triage Notes (Signed)
Called x 2 no answer

## 2022-09-05 ENCOUNTER — Other Ambulatory Visit: Payer: Self-pay

## 2022-09-05 ENCOUNTER — Encounter (HOSPITAL_COMMUNITY): Payer: Self-pay | Admitting: *Deleted

## 2022-09-05 ENCOUNTER — Emergency Department (HOSPITAL_COMMUNITY)
Admission: EM | Admit: 2022-09-05 | Discharge: 2022-09-05 | Disposition: A | Discharge status: Home / Self Care | Payer: Self-pay | Attending: Emergency Medicine | Admitting: Emergency Medicine

## 2022-09-05 DIAGNOSIS — S0081XA Abrasion of other part of head, initial encounter: Secondary | ICD-10-CM | POA: Insufficient documentation

## 2022-09-05 DIAGNOSIS — T148XXA Other injury of unspecified body region, initial encounter: Secondary | ICD-10-CM

## 2022-09-05 DIAGNOSIS — X58XXXA Exposure to other specified factors, initial encounter: Secondary | ICD-10-CM | POA: Insufficient documentation

## 2022-09-05 DIAGNOSIS — R21 Rash and other nonspecific skin eruption: Secondary | ICD-10-CM | POA: Insufficient documentation

## 2022-09-05 MED ORDER — BACITRACIN ZINC 500 UNIT/GM EX OINT
TOPICAL_OINTMENT | Freq: Two times a day (BID) | CUTANEOUS | Status: DC
  Filled 2022-09-05: qty 28.4

## 2022-09-05 NOTE — Discharge Instructions (Signed)
Please apply bacitracin to your wounds daily.  Return if development of any new or worsening symptoms.

## 2022-09-05 NOTE — ED Triage Notes (Signed)
C/o rash doesn't know why the cream is that he was given before

## 2022-09-05 NOTE — ED Provider Notes (Signed)
[facility] EMERGENCY DEPARTMENT Provider Note   CSN: [id number] Arrival date & time: 09/05/22  8527     History  Chief Complaint  Patient presents with   Rash    Cole Barrett is a 36 y.o. male.  Patient with history of paranoid schizophrenia and skin picking presents today requesting bacitracin ointment. States that he has wounds on his face from skin picking that he normally puts bacitracin on, but he ran out. He states that he doesn't have money to buy OTC meds. Denies fevers, chills, trouble swallowing, or drainage from his wounds.   The history is provided by the patient. No language interpreter was used.  Rash      Home Medications Prior to Admission medications   Medication Sig Start Date End Date Taking? Authorizing Provider  bacitracin ointment Apply 1 Application topically 2 (two) times daily. 08/21/22   Harriet Pho, PA-C  hydrocortisone cream 1 % Apply 1 application. topically 2 (two) times daily as needed (For rash). 03/05/22   Carlisle Cater, PA-C  hydrOXYzine (ATARAX) 25 MG tablet Take 1 tablet (25 mg total) by mouth 3 (three) times daily as needed for anxiety or itching. 12/23/21   Revonda Humphrey, NP  mupirocin cream (BACTROBAN) 2 % Apply 1 Application topically 2 (two) times daily. 06/22/22   Deatra Canter, Amjad, PA-C  risperiDONE (RISPERDAL) 1 MG tablet Take 1 tablet (1 mg total) by mouth at bedtime. 01/18/22   Prosperi, Christian H, PA-C  amantadine (SYMMETREL) 100 MG capsule Take 1 capsule (100 mg total) by mouth 2 (two) times daily. Patient not taking: Reported on 11/04/2019 09/01/19 11/17/19  Mallie Darting, NP      Allergies    Patient has no known allergies.    Review of Systems   Review of Systems  Skin:  Positive for wound.  All other systems reviewed and are negative.   Physical Exam Updated Vital Signs BP (!) 147/58 (BP Location: Right Arm)   Pulse 72   Temp 98.2 F (36.8 C) (Oral)   Resp 16   Ht 5\' 10"  (1.778 m)    Wt 99.8 kg   SpO2 95%   BMI 31.57 kg/m  Physical Exam Vitals and nursing note reviewed.  Constitutional:      General: He is not in acute distress.    Appearance: Normal appearance. He is normal weight. He is not ill-appearing, toxic-appearing or diaphoretic.  HENT:     Head: Normocephalic and atraumatic.     Mouth/Throat:     Comments: Able to open and close mouth without trismus, difficulty, or pain. Eyes:     Extraocular Movements: Extraocular movements intact.     Conjunctiva/sclera: Conjunctivae normal.     Pupils: Pupils are equal, round, and reactive to light.  Neck:     Comments: No meningismus Cardiovascular:     Rate and Rhythm: Normal rate.  Pulmonary:     Effort: Pulmonary effort is normal. No respiratory distress.  Musculoskeletal:        General: Normal range of motion.     Cervical back: Normal range of motion and neck supple. No tenderness.  Skin:    General: Skin is warm and dry.     Comments: Superficial abrasions noted to the left face appear to be healing without any fluctuance, induration, or purulent drainage. No crepitus or trismus.   Neurological:     General: No focal deficit present.     Mental Status: He is alert.  Psychiatric:  Mood and Affect: Mood normal.        Behavior: Behavior normal.     ED Results / Procedures / Treatments   Labs (all labs ordered are listed, but only abnormal results are displayed) Labs Reviewed - No data to display  EKG None  Radiology No results found.  Procedures Procedures    Medications Ordered in ED Medications  bacitracin ointment (has no administration in time range)    ED Course/ Medical Decision Making/ A&P                           Medical Decision Making Risk OTC drugs.   Patient presents today requesting bacitracin ointment for his left face wounds.  He is afebrile, nontoxic-appearing, in no acute distress with reassuring vital signs.  His wounds are superficial in nature  without fluctuance, induration, purulence, or other signs of infection.  No associated meningismus, trismus, or crepitus.  Patient given bacitracin per his request.  He is stable for discharge, educated on red flag symptoms that would prompt immediate return.  Patient is understanding and amenable with plan.  Discharged in stable condition.   Final Clinical Impression(s) / ED Diagnoses Final diagnoses:  Skin abrasion    Rx / DC Orders ED Discharge Orders     None     An After Visit Summary was printed and given to the patient.     Vear Clock 09/05/22 8841    Mardene Sayer, MD 09/05/22 [phone]

## 2022-09-05 NOTE — ED Notes (Signed)
Pt left. Pt advised to stay.

## 2022-09-06 ENCOUNTER — Emergency Department (HOSPITAL_COMMUNITY): Admission: EM | Admit: 2022-09-06 | Discharge: 2022-09-06 | Discharge status: Against Medical Advice | Payer: Self-pay

## 2022-09-06 NOTE — ED Notes (Signed)
Pt says that he has to go to work, and he is leaving, noted to be ambulating through triage doors.

## 2022-09-11 ENCOUNTER — Emergency Department (HOSPITAL_COMMUNITY)
Admission: EM | Admit: 2022-09-11 | Discharge: 2022-09-12 | Disposition: A | Discharge status: Home / Self Care | Payer: Self-pay | Attending: Emergency Medicine | Admitting: Emergency Medicine

## 2022-09-11 ENCOUNTER — Other Ambulatory Visit: Payer: Self-pay

## 2022-09-11 DIAGNOSIS — Z7689 Persons encountering health services in other specified circumstances: Secondary | ICD-10-CM

## 2022-09-11 DIAGNOSIS — L089 Local infection of the skin and subcutaneous tissue, unspecified: Secondary | ICD-10-CM

## 2022-09-11 DIAGNOSIS — Z760 Encounter for issue of repeat prescription: Secondary | ICD-10-CM | POA: Insufficient documentation

## 2022-09-11 DIAGNOSIS — L03221 Cellulitis of neck: Secondary | ICD-10-CM | POA: Insufficient documentation

## 2022-09-12 ENCOUNTER — Encounter (HOSPITAL_COMMUNITY): Payer: Self-pay | Admitting: Emergency Medicine

## 2022-09-12 ENCOUNTER — Other Ambulatory Visit: Payer: Self-pay

## 2022-09-12 MED ORDER — AMOXICILLIN-POT CLAVULANATE 875-125 MG PO TABS
1.0000 | ORAL_TABLET | Freq: Once | ORAL | Status: AC
  Administered 2022-09-12: 1 via ORAL
  Filled 2022-09-12: qty 1

## 2022-09-12 MED ORDER — AMOXICILLIN-POT CLAVULANATE 875-125 MG PO TABS: 1.0000 | ORAL_TABLET | Freq: Two times a day (BID) | ORAL | 0 refills | Status: DC

## 2022-09-12 MED ORDER — AMOXICILLIN-POT CLAVULANATE 875-125 MG PO TABS: 1.0000 | ORAL_TABLET | Freq: Two times a day (BID) | ORAL | 0 refills | Status: AC

## 2022-09-12 MED ORDER — BACITRACIN ZINC 500 UNIT/GM EX OINT
TOPICAL_OINTMENT | Freq: Two times a day (BID) | CUTANEOUS | Status: DC
  Filled 2022-09-12: qty 28.4

## 2022-09-12 MED ORDER — RISPERIDONE 1 MG PO TABS
1.0000 mg | ORAL_TABLET | Freq: Once | ORAL | Status: AC
  Administered 2022-09-12: 1 mg via ORAL
  Filled 2022-09-12: qty 1

## 2022-09-12 NOTE — Discharge Instructions (Signed)
You are seen in the ER tonight for your Risperdal.  Your dose was administered in the ER.  You also have an infection in the wounds on the left side of your neck.  Please take the paper prescription for the antibiotics to the pharmacy of your choice to have it filled; please take the prescribed antibiotics twice a day for the next week to treat the infection in your skin.  Return to ER with any redness swelling puslike drainage from the area, or any other new severe symptoms.

## 2022-09-12 NOTE — ED Triage Notes (Signed)
Pt requests his medication.  The rash on his the left side of his face appears to be getting infected.  The wound will be cleaned and recovered.

## 2022-09-12 NOTE — ED Provider Notes (Signed)
[facility] EMERGENCY DEPARTMENT Provider Note   CSN: [id number] Arrival date & time: 09/11/22  2319     History  Chief Complaint  Patient presents with   Medication Refill   Wound Check    Cole Barrett is a 36 y.o. male who presents to the ED for his Risperdal, seen Regularly for his nightly dose of Risperdal, last seen 5 days ago. Of note patient was seen in the recent past for abrasions to left side of the face from picking his skin at which time there is no evidence of infection, treated with bacitracin.  He has no current concerns aside from requesting his respite all.  I personally read the patient's medical records.  Patient with homelessness, history of schizophrenia and PE in the past, prescribed Risperdal daily, however does not have medications with him due to unstable environment with homelessness.  HPI     Home Medications Prior to Admission medications   Medication Sig Start Date End Date Taking? Authorizing Provider  amoxicillin-clavulanate (AUGMENTIN) 875-125 MG tablet Take 1 tablet by mouth every 12 (twelve) hours for 7 days. 09/12/22 09/19/22 Yes Bertie Simien R, PA-C  bacitracin ointment Apply 1 Application topically 2 (two) times daily. 08/21/22   Harriet Pho, PA-C  hydrocortisone cream 1 % Apply 1 application. topically 2 (two) times daily as needed (For rash). 03/05/22   Carlisle Cater, PA-C  hydrOXYzine (ATARAX) 25 MG tablet Take 1 tablet (25 mg total) by mouth 3 (three) times daily as needed for anxiety or itching. 12/23/21   Revonda Humphrey, NP  mupirocin cream (BACTROBAN) 2 % Apply 1 Application topically 2 (two) times daily. 06/22/22   Deatra Canter, Amjad, PA-C  risperiDONE (RISPERDAL) 1 MG tablet Take 1 tablet (1 mg total) by mouth at bedtime. 01/18/22   Prosperi, Christian H, PA-C  amantadine (SYMMETREL) 100 MG capsule Take 1 capsule (100 mg total) by mouth 2 (two) times daily. Patient not taking: Reported on 11/04/2019  09/01/19 11/17/19  Mallie Darting, NP      Allergies    Patient has no known allergies.    Review of Systems   Review of Systems  Skin:  Positive for wound.  Psychiatric/Behavioral:  Negative for suicidal ideas.     Physical Exam Updated Vital Signs BP 133/83 (BP Location: Right Arm)   Pulse 87   Temp 98.3 F (36.8 C) (Oral)   Resp 18   SpO2 96%  Physical Exam Vitals and nursing note reviewed.  Constitutional:      Appearance: He is obese. He is not ill-appearing or toxic-appearing.  HENT:     Head: Normocephalic and atraumatic.   Eyes:     General: No scleral icterus.       Right eye: No discharge.        Left eye: No discharge.     Conjunctiva/sclera: Conjunctivae normal.  Cardiovascular:     Heart sounds: Normal heart sounds. No murmur heard. Pulmonary:     Effort: Pulmonary effort is normal.  Skin:    General: Skin is warm and dry.     Capillary Refill: Capillary refill takes less than 2 seconds.  Neurological:     General: No focal deficit present.     Mental Status: He is alert.  Psychiatric:        Attention and Perception: Attention normal.        Mood and Affect: Mood normal.        Behavior: Behavior is cooperative.  Thought Content: Thought content does not include homicidal or suicidal ideation.     Comments: Does not appear to be responding to internal stimuli.      ED Results / Procedures / Treatments   Labs (all labs ordered are listed, but only abnormal results are displayed) Labs Reviewed - No data to display  EKG None  Radiology No results found.  Procedures Procedures    Medications Ordered in ED Medications  amoxicillin-clavulanate (AUGMENTIN) 875-125 MG per tablet 1 tablet (has no administration in time range)  risperiDONE (RISPERDAL) tablet 1 mg (has no administration in time range)  bacitracin ointment (has no administration in time range)    ED Course/ Medical Decision Making/ A&P Clinical Course as of 09/12/22  0032  Sun Sep 11, 2022  2358 Patient in triage bathroom x 25 minutes, will perform MSE once patient is back in triage room.  [RS]    Clinical Course User Index [RS] Lether Tesch, Gypsy Balsam, PA-C                           Medical Decision Making 36 year old male who presents with request for risperdal dose.   VS normal on intake. Cardiopulmonary exam is normal, patient without acute psychosis. Does have wounds to the left face/head, from skin picking, now with changes consistent with superficial skin infection of the wounds. No evidence of abscess on exam.   Risk Prescription drug management.   Clinical picture with early cellulitis of the left neck, will administer first dose of antibiotic in the ED in addition to patient's risperdal. Prescription for antibiotic provided. Anticipate poor compliance due to social circumstances, however patient endorsing ability to pick up prescription for antibiotics.   No further workup warranted in the ED at this time. Clinical concern for emergent underlying etiology of his symptoms that would warrant further ED workup or inpatient management is exceedingly low.   Ragan voiced understanding of her medical evaluation and treatment plan. Each of their questions answered to their expressed satisfaction.  Return precautions were given.  Patient is well-appearing, stable, and was discharged in good condition.  This chart was dictated using voice recognition software, Dragon. Despite the best efforts of this provider to proofread and correct errors, errors may still occur which can change documentation meaning.   Final Clinical Impression(s) / ED Diagnoses Final diagnoses:  Wound infection  Encounter for medication administration    Rx / DC Orders ED Discharge Orders          Ordered    amoxicillin-clavulanate (AUGMENTIN) 875-125 MG tablet  Every 12 hours        09/12/22 0022              Sharaine Delange, Gypsy Balsam, PA-C 09/12/22 0032     Mesner, Corene Cornea, MD 09/12/22 9735

## 2022-09-15 ENCOUNTER — Other Ambulatory Visit: Payer: Self-pay

## 2022-09-15 ENCOUNTER — Encounter (HOSPITAL_COMMUNITY): Payer: Self-pay

## 2022-09-15 ENCOUNTER — Emergency Department (HOSPITAL_COMMUNITY)
Admission: EM | Admit: 2022-09-15 | Discharge: 2022-09-15 | Disposition: A | Discharge status: Home / Self Care | Payer: Self-pay | Attending: Student | Admitting: Student

## 2022-09-15 DIAGNOSIS — Z760 Encounter for issue of repeat prescription: Secondary | ICD-10-CM | POA: Insufficient documentation

## 2022-09-15 DIAGNOSIS — X58XXXA Exposure to other specified factors, initial encounter: Secondary | ICD-10-CM | POA: Insufficient documentation

## 2022-09-15 DIAGNOSIS — S0180XA Unspecified open wound of other part of head, initial encounter: Secondary | ICD-10-CM | POA: Insufficient documentation

## 2022-09-15 MED ORDER — RISPERIDONE 1 MG PO TABS
1.0000 mg | ORAL_TABLET | Freq: Once | ORAL | Status: AC
  Administered 2022-09-15: 1 mg via ORAL
  Filled 2022-09-15: qty 1

## 2022-09-15 NOTE — Discharge Instructions (Signed)
Please follow-up with behavioral health for further management of your risperidone You have a wound on the left face you have antibiotics have sent to your pharmacy please pick up follow-up with community health and wellness  Come back to the emergency department if you develop chest pain, shortness of breath, severe abdominal pain, uncontrolled nausea, vomiting, diarrhea.

## 2022-09-15 NOTE — ED Triage Notes (Signed)
Request dose of Risperdal.

## 2022-09-15 NOTE — ED Provider Notes (Signed)
[facility] EMERGENCY DEPARTMENT Provider Note   CSN: [id number] Arrival date & time: 09/15/22  9323     History  Chief Complaint  Patient presents with   Medication Refill    Cole Barrett is a 36 y.o. male.  HPI   Medical history including paranoia, PE, homelessness, schizophrenia presents with request of medication refill, states that he is here for his Risperdal, states that he does not have it and just needs a dose.  He has no other complaints denies suicidal homicidal ideations denies any hallucinations or delusions.  States that he has been applying the Bactrim cream on his wound.   Reviewed patient's chart was seen in the past for similar presentation, most recent was seen 3 days ago patient's wound was evaluated at that time and was sent home with Augmentin.  Is unclear if he is taking the medication, but appears he is applying Anasept of ointments.    Home Medications Prior to Admission medications   Medication Sig Start Date End Date Taking? Authorizing Provider  amoxicillin-clavulanate (AUGMENTIN) 875-125 MG tablet Take 1 tablet by mouth every 12 (twelve) hours for 7 days. 09/12/22 09/19/22  Sponseller, Eugene Garnet R, PA-C  bacitracin ointment Apply 1 Application topically 2 (two) times daily. 08/21/22   Harriet Pho, PA-C  hydrocortisone cream 1 % Apply 1 application. topically 2 (two) times daily as needed (For rash). 03/05/22   Carlisle Cater, PA-C  hydrOXYzine (ATARAX) 25 MG tablet Take 1 tablet (25 mg total) by mouth 3 (three) times daily as needed for anxiety or itching. 12/23/21   Revonda Humphrey, NP  mupirocin cream (BACTROBAN) 2 % Apply 1 Application topically 2 (two) times daily. 06/22/22   Deatra Canter, Amjad, PA-C  risperiDONE (RISPERDAL) 1 MG tablet Take 1 tablet (1 mg total) by mouth at bedtime. 01/18/22   Prosperi, Christian H, PA-C  amantadine (SYMMETREL) 100 MG capsule Take 1 capsule (100 mg total) by mouth 2 (two) times daily. Patient  not taking: Reported on 11/04/2019 09/01/19 11/17/19  Mallie Darting, NP      Allergies    Patient has no known allergies.    Review of Systems   Review of Systems  Constitutional:  Negative for chills and fever.  Respiratory:  Negative for shortness of breath.   Cardiovascular:  Negative for chest pain.  Gastrointestinal:  Negative for abdominal pain.  Neurological:  Negative for headaches.    Physical Exam Updated Vital Signs BP 125/79 (BP Location: Right Arm)   Pulse 91   Temp 97.9 F (36.6 C) (Oral)   Resp 17   SpO2 97%  Physical Exam Vitals and nursing note reviewed.  Constitutional:      General: He is not in acute distress.    Appearance: Normal appearance. He is not ill-appearing or diaphoretic.  HENT:     Head: Normocephalic and atraumatic.     Nose: No congestion.  Eyes:     Conjunctiva/sclera: Conjunctivae normal.  Pulmonary:     Effort: Pulmonary effort is normal.     Breath sounds: Normal breath sounds.  Musculoskeletal:     Cervical back: Neck supple.  Skin:    General: Skin is warm and dry.     Comments: Patient has an abrasion on the left aspect of his face extending from his right mandible up to his left lower mastoid, dried blood was present, there is no drainage or discharge present, no drainage or discharge present, area is soft nontender no fluctuance induration  noted.  Please see picture for full detail  Neurological:     Mental Status: He is alert.  Psychiatric:        Mood and Affect: Mood normal.     Comments: Denies any suicidal homicidal ideations responding appropriately does not appear to be respond to internal stimuli.      ED Results / Procedures / Treatments   Labs (all labs ordered are listed, but only abnormal results are displayed) Labs Reviewed - No data to display  EKG None  Radiology No results found.  Procedures Procedures    Medications Ordered in ED Medications  risperiDONE (RISPERDAL) tablet 1 mg (has no  administration in time range)    ED Course/ Medical Decision Making/ A&P                           Medical Decision Making  This patient presents to the ED for concern of medication refill, this involves an extensive number of treatment options, and is a complaint that carries with it a high risk of complications and morbidity.  The differential diagnosis includes psychiatric emergency, withdrawals, cellulitis    Additional history obtained:  Additional history obtained from N/A External records from outside source obtained and reviewed including previous ER notes   Co morbidities that complicate the patient evaluation  Psychiatric disorder  Social Determinants of Health:  Homeless    Lab Tests:  I Ordered, and personally interpreted labs.  The pertinent results include: N/A   Imaging Studies ordered:  I ordered imaging studies including N/A I independently visualized and interpreted imaging which showed N/A I agree with the radiologist interpretation   Cardiac Monitoring:  The patient was maintained on a cardiac monitor.  I personally viewed and interpreted the cardiac monitored which showed an underlying rhythm of: N/A   Medicines ordered and prescription drug management:  I ordered medication including risperidone I have reviewed the patients home medicines and have made adjustments as needed  Critical Interventions:  N/A   Reevaluation:  Benign physical exam agreement with plan discharge.  Consultations Obtained:  N/A    Test Considered:  N/A    Rule out Doubt psychiatric emergency denies any suicidal Aline August ideations presenting appropriate all questions.  I have low suspicion for withdrawals he is nontremulous on my exam vital signs reassuring.  I low suspicion for sepsis secondary due to cellulitis vital signs are reassuring he is nontoxic-appearing wounds on his left side of his face appear to be healing well no evidence of worsening  infection no tracking no drainage no discharge no fluctuant induration present.    Dispostion and problem list  After consideration of the diagnostic results and the patients response to treatment, I feel that the patent would benefit from discharge.  Medication refill-provide a dose of risperidone encouraged him to follow-up with behavioral health for further management Facial wound-we will have patient continue with the Bactrim ointment that he is applying to his face, also encouraged him to pick up the Augmentin that was prescribed to him may follow-up with community health and wellness.            Final Clinical Impression(s) / ED Diagnoses Final diagnoses:  Medication refill  Open wound of face, initial encounter    Rx / DC Orders ED Discharge Orders     None         Carroll Sage, PA-C 09/15/22 6073    Palumbo, April, MD 09/15/22 2317

## 2022-09-18 ENCOUNTER — Emergency Department (HOSPITAL_COMMUNITY)
Admission: EM | Admit: 2022-09-18 | Discharge: 2022-09-19 | Disposition: A | Discharge status: Home / Self Care | Payer: Self-pay | Attending: Emergency Medicine | Admitting: Emergency Medicine

## 2022-09-18 DIAGNOSIS — Z7689 Persons encountering health services in other specified circumstances: Secondary | ICD-10-CM | POA: Insufficient documentation

## 2022-09-18 NOTE — ED Notes (Signed)
Pt called for triage, no response. 

## 2022-09-19 ENCOUNTER — Other Ambulatory Visit: Payer: Self-pay

## 2022-09-19 ENCOUNTER — Encounter (HOSPITAL_COMMUNITY): Payer: Self-pay | Admitting: *Deleted

## 2022-09-19 MED ORDER — AMOXICILLIN-POT CLAVULANATE 875-125 MG PO TABS
1.0000 | ORAL_TABLET | Freq: Once | ORAL | Status: DC
  Filled 2022-09-19: qty 1

## 2022-09-19 MED ORDER — BACITRACIN ZINC 500 UNIT/GM EX OINT
TOPICAL_OINTMENT | Freq: Two times a day (BID) | CUTANEOUS | Status: DC
  Filled 2022-09-19: qty 28.4

## 2022-09-19 NOTE — ED Notes (Signed)
Pt called 3x with no response

## 2022-09-19 NOTE — ED Triage Notes (Signed)
Pt arrives requesting the wound on the side of his neck be cleaned and a refill for his hydrocortisone cream

## 2022-09-19 NOTE — Discharge Instructions (Addendum)
Please fill your prescriptions at the pharmacy for oral and topical antibiotics.   Follow wound care instructions.   Follow up with primary care as soon as possible  Return to the ED for worsening wound, redness, fever, chills or any other concerns.

## 2022-09-19 NOTE — ED Provider Notes (Signed)
[place] EMERGENCY DEPARTMENT Provider Note   CSN: [id number] Arrival date & time: 09/18/22  2243     History  Chief Complaint  Patient presents with   Medication Refill    Cole Barrett is a 36 y.o. male with a hx of homelessness, prior VTE & schizophrenia who presents to the ED requesting topical ointment for his neck wound tonight. Patient reports that the wound has been there about 1 month, he was scratching at it tonight and irritated it some. He is requesting the ointment we have been giving him in the ED. The area is not overly painful. He denies fever, chills, nausea, vomiting or purulent drainage. Denies injury.   HPI     Home Medications Prior to Admission medications   Medication Sig Start Date End Date Taking? Authorizing Provider  amoxicillin-clavulanate (AUGMENTIN) 875-125 MG tablet Take 1 tablet by mouth every 12 (twelve) hours for 7 days. 09/12/22 09/19/22  Sponseller, Eugene Garnet R, PA-C  bacitracin ointment Apply 1 Application topically 2 (two) times daily. 08/21/22   Harriet Pho, PA-C  hydrocortisone cream 1 % Apply 1 application. topically 2 (two) times daily as needed (For rash). 03/05/22   Carlisle Cater, PA-C  hydrOXYzine (ATARAX) 25 MG tablet Take 1 tablet (25 mg total) by mouth 3 (three) times daily as needed for anxiety or itching. 12/23/21   Revonda Humphrey, NP  mupirocin cream (BACTROBAN) 2 % Apply 1 Application topically 2 (two) times daily. 06/22/22   Deatra Canter, Amjad, PA-C  risperiDONE (RISPERDAL) 1 MG tablet Take 1 tablet (1 mg total) by mouth at bedtime. 01/18/22   Prosperi, Christian H, PA-C  amantadine (SYMMETREL) 100 MG capsule Take 1 capsule (100 mg total) by mouth 2 (two) times daily. Patient not taking: Reported on 11/04/2019 09/01/19 11/17/19  Mallie Darting, NP      Allergies    Patient has no known allergies.    Review of Systems   Review of Systems  Constitutional:  Negative for chills and fever.   Gastrointestinal:  Negative for abdominal pain, nausea and vomiting.  Skin:  Positive for wound.  Neurological:  Negative for syncope.  All other systems reviewed and are negative.   Physical Exam Updated Vital Signs BP (!) 151/73 (BP Location: Right Arm)   Pulse 87   Temp 97.8 F (36.6 C) (Oral)   Resp 17   SpO2 99%  Physical Exam Vitals and nursing note reviewed.  Constitutional:      General: He is not in acute distress.    Appearance: He is well-developed.  HENT:     Head: Normocephalic and atraumatic.     Mouth/Throat:     Comments: No trismus. Tolerating own secretions w/o difficulty.  Eyes:     General:        Right eye: No discharge.        Left eye: No discharge.     Conjunctiva/sclera: Conjunctivae normal.  Neck:     Comments: Multiple wounds to the left side of the neck as pictured below. Some mild bleeding which was easily stopped with application of pressure. No significant purulence, induration, fluctuance, or crepitus.  Cardiovascular:     Rate and Rhythm: Normal rate and regular rhythm.  Pulmonary:     Effort: Pulmonary effort is normal. No respiratory distress.     Breath sounds: Normal breath sounds. No stridor.  Musculoskeletal:     Cervical back: Neck supple.  Skin:    General: Skin is warm and  dry.  Neurological:     Mental Status: He is alert.     Comments: Clear speech.   Psychiatric:        Behavior: Behavior normal.        Thought Content: Thought content normal.      ED Results / Procedures / Treatments   Labs (all labs ordered are listed, but only abnormal results are displayed) Labs Reviewed - No data to display  EKG None  Radiology No results found.  Procedures Procedures    Medications Ordered in ED Medications  bacitracin ointment ( Topical Given 09/19/22 0429)    ED Course/ Medical Decision Making/ A&P                           Medical Decision Making Risk OTC drugs. Prescription drug management.   Patient  presents to the ED requesting topical medication for his neck wounds which have been present x 1 month per his report. Nontoxic, vitals w/ mildly elevated BP- doubt HTN emergency.   Chart reviewed- seen in the ED for similar, initially for facial/neck abrasions due to picking, seen 10/15 and given bacitracin & augmentin, seen again 10/19 for same. Wounds appear fairly similar today as they did 10/19. No significant erythema, fluctuance, induration, or fluctuance. No findings to suggest abscess of necrotizing fascitis. Airways is patent. No trismus. Wounds cleansed with abx ointment and bandaging. Discussed wound care with the patient. He overall appears appropriate for discharge.   Social determinants: homelessness.   Final Clinical Impression(s) / ED Diagnoses Final diagnoses:  Encounter for medication administration    Rx / DC Orders ED Discharge Orders     None         Cherly Anderson, PA-C 09/19/22 0547    [name], MD 09/19/22 [phone]

## 2022-09-26 ENCOUNTER — Emergency Department (HOSPITAL_COMMUNITY)
Admission: EM | Admit: 2022-09-26 | Discharge: 2022-09-26 | Discharge status: Against Medical Advice | Payer: Self-pay | Attending: Emergency Medicine | Admitting: Emergency Medicine

## 2022-09-26 ENCOUNTER — Encounter (HOSPITAL_COMMUNITY): Payer: Self-pay

## 2022-09-26 ENCOUNTER — Other Ambulatory Visit: Payer: Self-pay

## 2022-09-26 DIAGNOSIS — Z5321 Procedure and treatment not carried out due to patient leaving prior to being seen by health care provider: Secondary | ICD-10-CM | POA: Insufficient documentation

## 2022-09-26 DIAGNOSIS — Z760 Encounter for issue of repeat prescription: Secondary | ICD-10-CM | POA: Insufficient documentation

## 2022-09-26 NOTE — ED Triage Notes (Signed)
Pt said he needs his Risperdal. Was seen for it 2 days ago

## 2022-09-28 ENCOUNTER — Emergency Department (HOSPITAL_COMMUNITY)
Admission: EM | Admit: 2022-09-28 | Discharge: 2022-09-28 | Disposition: A | Discharge status: Home / Self Care | Payer: Self-pay | Attending: Emergency Medicine | Admitting: Emergency Medicine

## 2022-09-28 ENCOUNTER — Other Ambulatory Visit: Payer: Self-pay

## 2022-09-28 ENCOUNTER — Encounter (HOSPITAL_COMMUNITY): Payer: Self-pay | Admitting: Emergency Medicine

## 2022-09-28 DIAGNOSIS — Z79899 Other long term (current) drug therapy: Secondary | ICD-10-CM

## 2022-09-28 DIAGNOSIS — Z760 Encounter for issue of repeat prescription: Secondary | ICD-10-CM | POA: Insufficient documentation

## 2022-09-28 MED ORDER — RISPERIDONE 1 MG PO TABS
1.0000 mg | ORAL_TABLET | Freq: Once | ORAL | Status: AC
  Administered 2022-09-28: 1 mg via ORAL
  Filled 2022-09-28: qty 1

## 2022-09-28 NOTE — ED Provider Notes (Signed)
[facility] DEPT Provider Note   CSN: [id number] Arrival date & time: 09/28/22  0757     History  Chief Complaint  Patient presents with   Medication Refill    Cole Barrett is a 36 y.o. male.  The history is provided by the patient and medical records. No language interpreter was used.  Medication Refill Medications/supplies requested:  Needs 1 dose of Risperdal Reason for request:  Medications not available Medications taken before: yes - see home medications        Home Medications Prior to Admission medications   Medication Sig Start Date End Date Taking? Authorizing Provider  bacitracin ointment Apply 1 Application topically 2 (two) times daily. 08/21/22   Harriet Pho, PA-C  hydrocortisone cream 1 % Apply 1 application. topically 2 (two) times daily as needed (For rash). 03/05/22   Carlisle Cater, PA-C  hydrOXYzine (ATARAX) 25 MG tablet Take 1 tablet (25 mg total) by mouth 3 (three) times daily as needed for anxiety or itching. 12/23/21   Revonda Humphrey, NP  mupirocin cream (BACTROBAN) 2 % Apply 1 Application topically 2 (two) times daily. 06/22/22   Deatra Canter, Amjad, PA-C  risperiDONE (RISPERDAL) 1 MG tablet Take 1 tablet (1 mg total) by mouth at bedtime. 01/18/22   Prosperi, Christian H, PA-C  amantadine (SYMMETREL) 100 MG capsule Take 1 capsule (100 mg total) by mouth 2 (two) times daily. Patient not taking: Reported on 11/04/2019 09/01/19 11/17/19  Mallie Darting, NP      Allergies    Patient has no known allergies.    Review of Systems   Review of Systems  Constitutional:  Negative for appetite change, chills, diaphoresis, fatigue and fever.  HENT:  Negative for congestion, ear pain and sore throat.   Eyes:  Negative for pain and visual disturbance.  Respiratory:  Negative for cough, chest tightness and shortness of breath.   Cardiovascular:  Negative for chest pain and palpitations.  Gastrointestinal:  Negative for  abdominal pain, constipation, diarrhea, nausea and vomiting.  Genitourinary:  Negative for dysuria, frequency and hematuria.  Musculoskeletal:  Negative for arthralgias, back pain and neck pain.  Skin:  Negative for rash and wound.  Neurological:  Negative for seizures, syncope and headaches.  Psychiatric/Behavioral:  Negative for agitation and confusion.   All other systems reviewed and are negative.   Physical Exam Updated Vital Signs BP (!) 172/98   Pulse 89   Temp 98.2 F (36.8 C) (Oral)   Resp 16   SpO2 100%  Physical Exam Vitals and nursing note reviewed.  Constitutional:      General: He is not in acute distress.    Appearance: He is well-developed. He is not ill-appearing, toxic-appearing or diaphoretic.  HENT:     Head: Normocephalic and atraumatic.     Nose: No congestion or rhinorrhea.     Mouth/Throat:     Mouth: Mucous membranes are moist.  Eyes:     Extraocular Movements: Extraocular movements intact.     Conjunctiva/sclera: Conjunctivae normal.     Pupils: Pupils are equal, round, and reactive to light.  Cardiovascular:     Rate and Rhythm: Normal rate and regular rhythm.     Heart sounds: No murmur heard. Pulmonary:     Effort: Pulmonary effort is normal. No respiratory distress.     Breath sounds: Normal breath sounds. No wheezing, rhonchi or rales.  Chest:     Chest wall: No tenderness.  Abdominal:  Palpations: Abdomen is soft.     Tenderness: There is no abdominal tenderness. There is no right CVA tenderness, left CVA tenderness, guarding or rebound.  Musculoskeletal:        General: No swelling or tenderness.     Cervical back: Neck supple.     Right lower leg: No edema.     Left lower leg: No edema.  Skin:    General: Skin is warm and dry.     Capillary Refill: Capillary refill takes less than 2 seconds.     Findings: No erythema.  Neurological:     General: No focal deficit present.     Mental Status: He is alert.  Psychiatric:         Mood and Affect: Mood normal.     ED Results / Procedures / Treatments   Labs (all labs ordered are listed, but only abnormal results are displayed) Labs Reviewed - No data to display  EKG None  Radiology No results found.  Procedures Procedures    Medications Ordered in ED Medications  risperiDONE (RISPERDAL) tablet 1 mg (has no administration in time range)    ED Course/ Medical Decision Making/ A&P                           Medical Decision Making Risk Prescription drug management.    Cole Barrett is a 36 y.o. male with a past medical history significant for previous pulmonary embolism and schizophrenia who presents for a dose of Risperdal.  Patient reports that he has not been at home today and does not have access to his respite all currently.  He reports he just needs 1 dose of respite all then he can go home to continue his outpatient management.  He reports he still has medication at home and just needs today's dose.  He had some dried blood on his chart but said that he has no physical complaints and is complaining of no injuries.  He specifically denies fevers, chills, chest pain, shortness of breath, nausea, vomiting, constipation, diarrhea, or urinary changes.  Denies any trauma or injuries.  He just requested a dose of Risperdal and then he would like to follow-up with his outpatient team.  On my exam, lungs clear and chest nontender.  Back nontender.  Abdomen nontender.  Dried blood on shirt but otherwise no evidence of acute trauma initially.  Patient had pupils are symmetric and reactive with normal extraocular movements.  He otherwise appears well.  Patient is pleasant and answering questions appropriately.  Given his lack of other complaints, will give him the 1 dose of Risperdal he requested and we will discharge him for outpatient follow-up.  Patient agrees and was discharged.         Final Clinical Impression(s) / ED Diagnoses Final  diagnoses:  Medication management    Rx / DC Orders ED Discharge Orders     None       Clinical Impression: 1. Medication management     Disposition: Discharge  Condition: Good  I have discussed the results, Dx and Tx plan with the pt(& family if present). He/she/they expressed understanding and agree(s) with the plan. Discharge instructions discussed at great length. Strict return precautions discussed and pt &/or family have verbalized understanding of the instructions. No further questions at time of discharge.    New Prescriptions   No medications on file    Follow Up: [facility]  [address] [employer] [address] [place] [address] [phone] Schedule an appointment as soon as possible for a visit    [facility] [address] [phone] mc [place] [address] [phone]        Arleth Mccullar, Canary Brim, MD 09/28/22 1357

## 2022-09-28 NOTE — Discharge Instructions (Signed)
Given your lack of any complaints today, we held on extensive work-up.  As you only reported to be here to get a dose of respite all, we gave you the dose of medication and will have you follow-up with your outpatient team.  You reported you have enough medication.  Please rest and stay hydrated and follow-up as previously directed.

## 2022-09-28 NOTE — ED Triage Notes (Signed)
Pt reports he needs risperdal refilled.

## 2022-09-29 ENCOUNTER — Encounter (HOSPITAL_COMMUNITY): Payer: Self-pay

## 2022-09-29 ENCOUNTER — Emergency Department (HOSPITAL_COMMUNITY)
Admission: EM | Admit: 2022-09-29 | Discharge: 2022-09-29 | Disposition: A | Discharge status: Home / Self Care | Payer: Self-pay | Attending: Emergency Medicine | Admitting: Emergency Medicine

## 2022-09-29 ENCOUNTER — Other Ambulatory Visit: Payer: Self-pay

## 2022-09-29 DIAGNOSIS — Z79899 Other long term (current) drug therapy: Secondary | ICD-10-CM

## 2022-09-29 DIAGNOSIS — Z760 Encounter for issue of repeat prescription: Secondary | ICD-10-CM | POA: Insufficient documentation

## 2022-09-29 MED ORDER — RISPERIDONE 1 MG PO TABS
1.0000 mg | ORAL_TABLET | Freq: Once | ORAL | Status: AC
  Administered 2022-09-29: 1 mg via ORAL
  Filled 2022-09-29: qty 1

## 2022-09-29 NOTE — ED Provider Notes (Signed)
[facility] EMERGENCY DEPARTMENT Provider Note   CSN: [id number] Arrival date & time: 09/29/22  0557     History  Chief Complaint  Patient presents with   Medication Refill    Cole Barrett is a 36 y.o. male.  The history is provided by the patient and medical records.  Medication Refill  36 y.o. M here requesting dose of risperdal.  Well known to this facility, seen almost nightly for the same.  No other complaints.  No hallucinations, no SI/HI.  Home Medications Prior to Admission medications   Medication Sig Start Date End Date Taking? Authorizing Provider  bacitracin ointment Apply 1 Application topically 2 (two) times daily. 08/21/22   Gareth Eagle, PA-C  hydrocortisone cream 1 % Apply 1 application. topically 2 (two) times daily as needed (For rash). 03/05/22   Renne Crigler, PA-C  hydrOXYzine (ATARAX) 25 MG tablet Take 1 tablet (25 mg total) by mouth 3 (three) times daily as needed for anxiety or itching. 12/23/21   Ardis Hughs, NP  mupirocin cream (BACTROBAN) 2 % Apply 1 Application topically 2 (two) times daily. 06/22/22   Karie Mainland, Amjad, PA-C  risperiDONE (RISPERDAL) 1 MG tablet Take 1 tablet (1 mg total) by mouth at bedtime. 01/18/22   Prosperi, Christian H, PA-C  amantadine (SYMMETREL) 100 MG capsule Take 1 capsule (100 mg total) by mouth 2 (two) times daily. Patient not taking: Reported on 11/04/2019 09/01/19 11/17/19  [name], NP      Allergies    Patient has no known allergies.    Review of Systems   Review of Systems  Constitutional:        Medications  All other systems reviewed and are negative.   Physical Exam Updated Vital Signs BP (!) 146/83 (BP Location: Left Arm)   Pulse (!) 107   Temp 98 F (36.7 C)   Resp 19   Ht 5\' 10"  (1.778 m)   Wt 99.8 kg   SpO2 98%   BMI 31.57 kg/m   Physical Exam Vitals and nursing note reviewed.  Constitutional:      Appearance: He is well-developed.  HENT:     Head:  Normocephalic and atraumatic.  Eyes:     Conjunctiva/sclera: Conjunctivae normal.     Pupils: Pupils are equal, round, and reactive to light.  Cardiovascular:     Rate and Rhythm: Normal rate and regular rhythm.     Heart sounds: Normal heart sounds.  Pulmonary:     Effort: Pulmonary effort is normal.     Breath sounds: Normal breath sounds.  Abdominal:     General: Bowel sounds are normal.     Palpations: Abdomen is soft.  Musculoskeletal:        General: Normal range of motion.     Cervical back: Normal range of motion.  Skin:    General: Skin is warm and dry.  Neurological:     Mental Status: He is alert and oriented to person, place, and time.     ED Results / Procedures / Treatments   Labs (all labs ordered are listed, but only abnormal results are displayed) Labs Reviewed - No data to display  EKG None  Radiology No results found.  Procedures Procedures    Medications Ordered in ED Medications  risperiDONE (RISPERDAL) tablet 1 mg (has no administration in time range)    ED Course/ Medical Decision Making/ A&P  Medical Decision Making Risk Prescription drug management.   36 y.o. M here requesting nightly dose of risperdal. No other complaints.  Appears at baseline.  Given risperdal as requested.  Stable for discharge.  Final Clinical Impression(s) / ED Diagnoses Final diagnoses:  Medication management    Rx / DC Orders ED Discharge Orders     None         Larene Pickett, PA-C 09/29/22 [id number]    Quintella Reichert, MD 10/01/22 [phone]

## 2022-09-29 NOTE — ED Triage Notes (Signed)
Request dose of Risperdal.  

## 2022-09-30 ENCOUNTER — Encounter (HOSPITAL_COMMUNITY): Payer: Self-pay | Admitting: Emergency Medicine

## 2022-09-30 ENCOUNTER — Other Ambulatory Visit: Payer: Self-pay

## 2022-09-30 ENCOUNTER — Emergency Department (HOSPITAL_COMMUNITY)
Admission: EM | Admit: 2022-09-30 | Discharge: 2022-09-30 | Disposition: A | Discharge status: Home / Self Care | Payer: Self-pay | Attending: Emergency Medicine | Admitting: Emergency Medicine

## 2022-09-30 DIAGNOSIS — Z79899 Other long term (current) drug therapy: Secondary | ICD-10-CM

## 2022-09-30 DIAGNOSIS — Z7189 Other specified counseling: Secondary | ICD-10-CM | POA: Insufficient documentation

## 2022-09-30 MED ORDER — RISPERIDONE 1 MG PO TABS
1.0000 mg | ORAL_TABLET | Freq: Once | ORAL | Status: AC
  Administered 2022-09-30: 1 mg via ORAL
  Filled 2022-09-30 (×2): qty 1

## 2022-09-30 NOTE — ED Triage Notes (Signed)
Patient requesting Risperidal .

## 2022-09-30 NOTE — Discharge Instructions (Addendum)
Return for any problem.  ?

## 2022-09-30 NOTE — ED Provider Notes (Signed)
[facility] EMERGENCY DEPARTMENT Provider Note   CSN: [id number] Arrival date & time: 09/30/22  0558     History  Chief Complaint  Patient presents with   Requesting Risperidal     Cole Barrett is a 36 y.o. male.  36 year old male with prior medical history as detailed below presents for evaluation.  Patient is requesting dose of Risperdal.  Patient without other complaint.  Patient is here frequently -nearly every day -with same complaint/request.  The history is provided by the patient and medical records.       Home Medications Prior to Admission medications   Medication Sig Start Date End Date Taking? Authorizing Provider  bacitracin ointment Apply 1 Application topically 2 (two) times daily. 08/21/22   Harriet Pho, PA-C  hydrocortisone cream 1 % Apply 1 application. topically 2 (two) times daily as needed (For rash). 03/05/22   Carlisle Cater, PA-C  hydrOXYzine (ATARAX) 25 MG tablet Take 1 tablet (25 mg total) by mouth 3 (three) times daily as needed for anxiety or itching. 12/23/21   Revonda Humphrey, NP  mupirocin cream (BACTROBAN) 2 % Apply 1 Application topically 2 (two) times daily. 06/22/22   Deatra Canter, Amjad, PA-C  risperiDONE (RISPERDAL) 1 MG tablet Take 1 tablet (1 mg total) by mouth at bedtime. 01/18/22   Prosperi, Christian H, PA-C  amantadine (SYMMETREL) 100 MG capsule Take 1 capsule (100 mg total) by mouth 2 (two) times daily. Patient not taking: Reported on 11/04/2019 09/01/19 11/17/19  Mallie Darting, NP      Allergies    Patient has no known allergies.    Review of Systems   Review of Systems  All other systems reviewed and are negative.   Physical Exam Updated Vital Signs BP 127/83 (BP Location: Right Arm)   Pulse (!) 103   Temp (!) 97.5 F (36.4 C)   Resp 16   SpO2 100%  Physical Exam Vitals and nursing note reviewed.  Constitutional:      General: He is not in acute distress.    Appearance: Normal appearance. He  is well-developed.  HENT:     Head: Normocephalic and atraumatic.  Eyes:     Conjunctiva/sclera: Conjunctivae normal.     Pupils: Pupils are equal, round, and reactive to light.  Cardiovascular:     Rate and Rhythm: Normal rate and regular rhythm.     Heart sounds: Normal heart sounds.  Pulmonary:     Effort: Pulmonary effort is normal. No respiratory distress.     Breath sounds: Normal breath sounds.  Abdominal:     General: There is no distension.     Palpations: Abdomen is soft.     Tenderness: There is no abdominal tenderness.  Musculoskeletal:        General: No deformity. Normal range of motion.     Cervical back: Normal range of motion and neck supple.  Skin:    General: Skin is warm and dry.  Neurological:     General: No focal deficit present.     Mental Status: He is alert and oriented to person, place, and time.     ED Results / Procedures / Treatments   Labs (all labs ordered are listed, but only abnormal results are displayed) Labs Reviewed - No data to display  EKG None  Radiology No results found.  Procedures Procedures    Medications Ordered in ED Medications  risperiDONE (RISPERDAL) tablet 1 mg (has no administration in time range)  ED Course/ Medical Decision Making/ A&P                           Medical Decision Making   Medical Screen Complete  This patient presented to the ED with complaint of needing risperdal.  This presentation is: Chronic, Previously Undiagnosed, and Uncertain Prognosis   Additional history obtained:  External records from outside sources obtained and reviewed including prior ED visits and prior Inpatient records.    Medicines ordered:  I ordered medication including risperdal  for mental health  Reevaluation of the patient after these medicines showed that the patient: stayed the same  Problem List / ED Course:  Medication request   Reevaluation:  After the interventions noted above, I  reevaluated the patient and found that they have: stayed the same   Disposition:  After consideration of the diagnostic results and the patients response to treatment, I feel that the patent would benefit from close outpatient followup.          Final Clinical Impression(s) / ED Diagnoses Final diagnoses:  Medication management    Rx / DC Orders ED Discharge Orders     None         Wynetta Fines, MD 09/30/22 [phone]

## 2022-09-30 NOTE — ED Notes (Signed)
Patient provided bacitracin, gauze, and medical tape for rash on left neck. Patient verbalizes understanding of keeping clean and dressed.

## 2022-10-01 ENCOUNTER — Encounter (HOSPITAL_COMMUNITY): Payer: Self-pay | Admitting: *Deleted

## 2022-10-01 ENCOUNTER — Ambulatory Visit (HOSPITAL_COMMUNITY)
Admission: EM | Admit: 2022-10-01 | Discharge: 2022-10-02 | Disposition: A | Discharge status: Home / Self Care | Payer: No Payment, Other | Attending: Family | Admitting: Family

## 2022-10-01 ENCOUNTER — Emergency Department (HOSPITAL_COMMUNITY)
Admission: EM | Admit: 2022-10-01 | Discharge: 2022-10-01 | Disposition: A | Discharge status: Home / Self Care | Payer: Self-pay | Attending: Emergency Medicine | Admitting: Emergency Medicine

## 2022-10-01 ENCOUNTER — Other Ambulatory Visit: Payer: Self-pay

## 2022-10-01 DIAGNOSIS — F209 Schizophrenia, unspecified: Secondary | ICD-10-CM | POA: Insufficient documentation

## 2022-10-01 DIAGNOSIS — R440 Auditory hallucinations: Secondary | ICD-10-CM | POA: Insufficient documentation

## 2022-10-01 DIAGNOSIS — R451 Restlessness and agitation: Secondary | ICD-10-CM | POA: Insufficient documentation

## 2022-10-01 DIAGNOSIS — F419 Anxiety disorder, unspecified: Secondary | ICD-10-CM | POA: Insufficient documentation

## 2022-10-01 DIAGNOSIS — Z79899 Other long term (current) drug therapy: Secondary | ICD-10-CM | POA: Insufficient documentation

## 2022-10-01 DIAGNOSIS — R443 Hallucinations, unspecified: Secondary | ICD-10-CM

## 2022-10-01 DIAGNOSIS — F17210 Nicotine dependence, cigarettes, uncomplicated: Secondary | ICD-10-CM | POA: Insufficient documentation

## 2022-10-01 DIAGNOSIS — Z1152 Encounter for screening for COVID-19: Secondary | ICD-10-CM | POA: Insufficient documentation

## 2022-10-01 LAB — CBC
HCT: 36.3 % — ABNORMAL LOW (ref 39.0–52.0)
Hemoglobin: 11.6 g/dL — ABNORMAL LOW (ref 13.0–17.0)
MCH: 27.8 pg (ref 26.0–34.0)
MCHC: 32 g/dL (ref 30.0–36.0)
MCV: 87.1 fL (ref 80.0–100.0)
Platelets: 355 10*3/uL (ref 150–400)
RBC: 4.17 MIL/uL — ABNORMAL LOW (ref 4.22–5.81)
RDW: 12.9 % (ref 11.5–15.5)
WBC: 4.9 10*3/uL (ref 4.0–10.5)
nRBC: 0 % (ref 0.0–0.2)

## 2022-10-01 LAB — COMPREHENSIVE METABOLIC PANEL
ALT: 32 U/L (ref 0–44)
AST: 20 U/L (ref 15–41)
Albumin: 3.7 g/dL (ref 3.5–5.0)
Alkaline Phosphatase: 43 U/L (ref 38–126)
Anion gap: 17 — ABNORMAL HIGH (ref 5–15)
BUN: 10 mg/dL (ref 6–20)
CO2: 21 mmol/L — ABNORMAL LOW (ref 22–32)
Calcium: 9.3 mg/dL (ref 8.9–10.3)
Chloride: 104 mmol/L (ref 98–111)
Creatinine, Ser: 0.84 mg/dL (ref 0.61–1.24)
GFR, Estimated: >60 mL/min (ref >60)
Glucose, Bld: 106 mg/dL — ABNORMAL HIGH (ref 70–99)
Potassium: 3.6 mmol/L (ref 3.5–5.1)
Sodium: 142 mmol/L (ref 135–145)
Total Bilirubin: 0.5 mg/dL (ref 0.3–1.2)
Total Protein: 6.9 g/dL (ref 6.5–8.1)

## 2022-10-01 LAB — RAPID URINE DRUG SCREEN, HOSP PERFORMED
Amphetamines: NOT DETECTED
Barbiturates: NOT DETECTED
Benzodiazepines: NOT DETECTED
Cocaine: NOT DETECTED
Opiates: NOT DETECTED
Tetrahydrocannabinol: NOT DETECTED

## 2022-10-01 LAB — RESP PANEL BY RT-PCR (FLU A&B, COVID) ARPGX2
Influenza A by PCR: NEGATIVE
Influenza B by PCR: NEGATIVE
SARS Coronavirus 2 by RT PCR: NEGATIVE

## 2022-10-01 LAB — ACETAMINOPHEN LEVEL: Acetaminophen (Tylenol), Serum: <10 ug/mL — ABNORMAL LOW (ref 10–30)

## 2022-10-01 LAB — SALICYLATE LEVEL: Salicylate Lvl: <7 mg/dL — ABNORMAL LOW (ref 7.0–30.0)

## 2022-10-01 LAB — ETHANOL: Alcohol, Ethyl (B): 64 mg/dL — ABNORMAL HIGH (ref <10)

## 2022-10-01 MED ORDER — MUPIROCIN CALCIUM 2 % EX CREA
1.0000 | TOPICAL_CREAM | Freq: Once | CUTANEOUS | Status: AC
  Administered 2022-10-01: 1 via TOPICAL
  Filled 2022-10-01: qty 15

## 2022-10-01 MED ORDER — ALUM & MAG HYDROXIDE-SIMETH 200-200-20 MG/5ML PO SUSP: 30.0000 mL | ORAL | Status: DC | PRN

## 2022-10-01 MED ORDER — ACETAMINOPHEN 325 MG PO TABS: 650.0000 mg | ORAL_TABLET | Freq: Four times a day (QID) | ORAL | Status: DC | PRN

## 2022-10-01 MED ORDER — MAGNESIUM HYDROXIDE 400 MG/5ML PO SUSP: 30.0000 mL | Freq: Every day | ORAL | Status: DC | PRN

## 2022-10-01 MED ORDER — TRAZODONE HCL 50 MG PO TABS
50.0000 mg | ORAL_TABLET | Freq: Every evening | ORAL | Status: DC | PRN
  Administered 2022-10-01: 50 mg via ORAL
  Filled 2022-10-01: qty 1

## 2022-10-01 MED ORDER — RISPERIDONE 2 MG PO TABS
2.0000 mg | ORAL_TABLET | Freq: Two times a day (BID) | ORAL | Status: DC
  Administered 2022-10-01 – 2022-10-02 (×2): 2 mg via ORAL
  Filled 2022-10-01 (×2): qty 1

## 2022-10-01 NOTE — ED Notes (Signed)
Pt is voluntary

## 2022-10-01 NOTE — ED Notes (Signed)
Pt admitted to the unit. Fixated on cream for his neck. Patient reports the area to his neck as a rash but it is more of a scratched area. Skin is red and inflamed, irritated. Provider aware. Denies SI/HI, endorses AH. Denies VH. Contracts for safety on unit. Patient encouraged to shower upon arrival due to body odor. Currently in shower.

## 2022-10-01 NOTE — ED Provider Notes (Signed)
[facility] EMERGENCY DEPARTMENT Provider Note   CSN: [id number] Arrival date & time: 10/01/22  [id number]     History  Chief Complaint  Patient presents with   Hallucinations    Cole Barrett is a 36 y.o. male Pt who is well-known to this department for near daily presentation for Risperdal dosing.  He presents this evening, however agitated.  States that he "needs to be evaluated by behavioral health tonight".  States that he presented for his daily Risperdal but it is not helping.  He states that the voices in his head are too loud, "the situation has become painful for me".  He states that the voices are stating that they are going to kill him and occasionally telling him that he needs to kill people though they do not suggest ways to do this.  Does appear to responding to auditory hallucinations at time of my exam.  I personally read the patient medical records.  History of homelessness, schizophrenia, auditory hallucinations.  HPI     Home Medications Prior to Admission medications   Medication Sig Start Date End Date Taking? Authorizing Provider  bacitracin ointment Apply 1 Application topically 2 (two) times daily. 08/21/22   Harriet Pho, PA-C  hydrocortisone cream 1 % Apply 1 application. topically 2 (two) times daily as needed (For rash). 03/05/22   Carlisle Cater, PA-C  hydrOXYzine (ATARAX) 25 MG tablet Take 1 tablet (25 mg total) by mouth 3 (three) times daily as needed for anxiety or itching. 12/23/21   Revonda Humphrey, NP  mupirocin cream (BACTROBAN) 2 % Apply 1 Application topically 2 (two) times daily. 06/22/22   Deatra Canter, Amjad, PA-C  risperiDONE (RISPERDAL) 1 MG tablet Take 1 tablet (1 mg total) by mouth at bedtime. 01/18/22   Prosperi, Christian H, PA-C  amantadine (SYMMETREL) 100 MG capsule Take 1 capsule (100 mg total) by mouth 2 (two) times daily. Patient not taking: Reported on 11/04/2019 09/01/19 11/17/19  Mallie Darting, NP       Allergies    Patient has no known allergies.    Review of Systems   Review of Systems  Psychiatric/Behavioral:  Positive for hallucinations. The patient is nervous/anxious.     Physical Exam Updated Vital Signs BP 110/60   Pulse 95   Temp 97.6 F (36.4 C) (Oral)   Resp 16   SpO2 95%  Physical Exam Vitals and nursing note reviewed.  Constitutional:      Appearance: He is obese. He is not ill-appearing or toxic-appearing.  HENT:     Head: Normocephalic and atraumatic.     Mouth/Throat:     Mouth: Mucous membranes are moist.     Pharynx: No oropharyngeal exudate or posterior oropharyngeal erythema.  Eyes:     General:        Right eye: No discharge.        Left eye: No discharge.     Extraocular Movements: Extraocular movements intact.     Conjunctiva/sclera: Conjunctivae normal.     Pupils: Pupils are equal, round, and reactive to light.  Neck:   Cardiovascular:     Rate and Rhythm: Normal rate and regular rhythm.     Pulses: Normal pulses.     Heart sounds: Normal heart sounds. No murmur heard. Pulmonary:     Effort: Pulmonary effort is normal. No respiratory distress.     Breath sounds: Normal breath sounds. No wheezing or rales.  Abdominal:     General: Bowel sounds are  normal. There is no distension.     Palpations: Abdomen is soft.     Tenderness: There is no abdominal tenderness. There is no guarding or rebound.  Musculoskeletal:        General: No deformity.     Cervical back: Neck supple.     Right lower leg: No edema.     Left lower leg: No edema.  Skin:    General: Skin is warm and dry.     Capillary Refill: Capillary refill takes less than 2 seconds.  Neurological:     General: No focal deficit present.     Mental Status: He is alert and oriented to person, place, and time. Mental status is at baseline.  Psychiatric:        Mood and Affect: Mood is anxious.        Behavior: Behavior is withdrawn. Behavior is not combative. Behavior is  cooperative.        Thought Content: Thought content does not include homicidal or suicidal ideation. Thought content does not include homicidal or suicidal plan.     Comments: Appears to be responding to internal stimuli.     ED Results / Procedures / Treatments   Labs (all labs ordered are listed, but only abnormal results are displayed) Labs Reviewed  COMPREHENSIVE METABOLIC PANEL  ETHANOL  CBC  RAPID URINE DRUG SCREEN, HOSP PERFORMED  SALICYLATE LEVEL  ACETAMINOPHEN LEVEL    EKG None  Radiology No results found.  Procedures Procedures    Medications Ordered in ED Medications - No data to display  ED Course/ Medical Decision Making/ A&P Clinical Course as of 10/01/22 [id number]  Sat Oct 01, 2022  0622 Case discussed with [facility] provider Charlesetta Shanks, who initially stated that she would arrange for TTS evaluation immediately to prevent multiple transfers should the patient warrant inpatient management.  Subsequentl [facility] provider stated that patient would not be seen until after 730 by day team for TTS evaluation, however she still felt it was not appropriate for him to be transferred to [facility] now, as he may require inpatient management and that would require multiple transfers as opposed to a singular transfer should he stay here for TTS evaluation.  At this juncture, though more challenging for the medical emergency department, patient will remain in the emergency department awaiting TTS evaluation.  Calm and cooperative at this time. [RS]    Clinical Course User Index [RS] Anees Vanecek, Gypsy Balsam, PA-C                           Medical Decision Making 36 year old male who presents with concern for uncontrolled auditory hallucinations.   VS normal on intake, cardiopulmonary exam is reassuring, abdominal exam is benign. Skin exam as above, improved from prior, no evidence of acute infectious process.   Amount and/or Complexity of Data Reviewed Labs: ordered.   Time of shift change  care of this patient signed out to oncoming ED provider Aberman PA-C pending medical clearance labs and TTS evaluation for disposition planning.  All pertinent HPI, physical exam and laboratory findings were discussed with her prior to my departure.  Gerald Stabs  voiced understanding of her medical evaluation and treatment plan. Each of their questions answered to their expressed satisfaction.  He remains voluntary at this time.   This chart was dictated using voice recognition software, Dragon. Despite the best efforts of this provider to proofread and correct errors, errors may still occur which can  change documentation meaning.  Final Clinical Impression(s) / ED Diagnoses Final diagnoses:  None    Rx / DC Orders ED Discharge Orders     None         Emeline Darling, PA-C 10/01/22 Lealman, Aline, DO 10/01/22 [phone]

## 2022-10-01 NOTE — ED Provider Notes (Signed)
Assumed care from Marko Plume, PA-C at shift change pending medical clearance labs.  See her note for full HPI.  Patient presents here with hallucinations.  Patient presents almost daily to the ER for Risperdal dose.  CBC reassuring.  No leukocytosis.  Mild anemia with hemoglobin 11.6.  Ethanol mildly elevated at 64.  Salicylate and acetaminophen levels within normal limits.  CMP reassuring.  Normal renal function.  UDS negative.  Patient has been medically cleared for TTS evaluation.  Patient is here voluntarily.   Suzy Bouchard, PA-C 10/01/22 5300    Blanchie Dessert, MD 10/01/22 1151

## 2022-10-01 NOTE — ED Provider Notes (Signed)
[facility] Continuous Assessment Admission H&P  Date: 10/01/22 Patient Name: Cole Barrett MRN: [id number] Chief Complaint: Auditory Hallucinations Diagnoses:  Final diagnoses:  Auditory hallucinations    HPI: Cole Barrett is a 39 African-American male well-known to this service.  He reports chronic auditory hallucinations however he stated that last night the voices are getting louder.  States he has not had his medication in the past 2 days.  He is denying suicidal or homicidal ideations.  Denies that the voices are command in nature.  Cole Barrett states "I just need to go to behavioral health."   Cole Barrett reports he has been keeping his outpatient follow-up appointments but does not feel like the medication is helpful.  Discussed titrating risperidone 1 mg p.o. twice daily to 2 mg p.o. twice daily, will monitor symptoms.  Patient to be reassessed by psychiatry on 10/02/2022.  Per initial admission assessment note: " Cole Barrett is a 36 y.o. male Pt who is well-known to this department for near daily presentation for Risperdal dosing.  He presents this evening, however agitated.  States that he "needs to be evaluated by behavioral health tonight".  States that he presented for his daily Risperdal but it is not helping.  He states that the voices in his head are too loud, "the situation has become painful for me".  He states that the voices are stating that they are going to kill him and occasionally telling him that he needs to kill people though they do not suggest ways to do this.  Does appear to responding to auditory hallucinations at time of my exam."   PHQ 2-9:  [place] ED from 12/23/2021 in [facility] ED from 12/26/2020 in [place] ED from 06/05/2020 in [place]  Thoughts that you would be better off dead, or of hurting yourself in some way  Not at all Not at all Not at all  PHQ-9 Total Score 6 0 0       Flowsheet Row ED from 10/01/2022 in [place] ED from 09/30/2022 in [place] ED from 09/29/2022 in [place] No Risk No Risk No Risk        Total Time spent with patient: 15 minutes  Musculoskeletal  Strength & Muscle Tone: within normal limits Gait & Station: normal Patient leans: N/A  Psychiatric Specialty Exam  Presentation General Appearance:  Casual  Eye Contact: Good  Speech: Clear and Coherent; Normal Rate  Speech Volume: Normal  Handedness: Right   Mood and Affect  Mood: Depressed  Affect: Congruent   Thought Process  Thought Processes: Coherent  Descriptions of Associations:Intact  Orientation:Full (Time, Place and Person)  Thought Content:Logical  Diagnosis of Schizophrenia or Schizoaffective disorder in past: Yes  Duration of Psychotic Symptoms: Greater than six months  Hallucinations:No data recorded Ideas of Reference:None  Suicidal Thoughts:No data recorded Homicidal Thoughts:No data recorded  Sensorium  Memory: Immediate Fair; Recent Fair; Remote Fair  Judgment: Good  Insight: Good   Executive Functions  Concentration: Good  Attention Span: Good  Recall: Good  Fund of Knowledge: Good  Language: Good   Psychomotor Activity  Psychomotor Activity:No data recorded  Assets  Assets: Communication Skills; Desire for Improvement; Housing; Physical Health; Social Support   Sleep  Sleep:No data recorded  No data recorded  Physical Exam Vitals and nursing note reviewed.  Cardiovascular:     Rate and Rhythm: Normal rate and regular rhythm.     Pulses: Normal pulses.  Neurological:     Mental Status: He is alert and oriented to person, place, and time.  Psychiatric:        Mood and Affect: Mood normal.     Review of Systems  Cardiovascular: Negative.   Gastrointestinal: Negative.   Skin: Negative.   Psychiatric/Behavioral:  Positive for hallucinations.   All other systems reviewed and are negative.   Blood pressure (!) 140/112, pulse 72, temperature 98.2 F (36.8 C), temperature source Oral, resp. rate 20, SpO2 100 %. There is no height or weight on file to calculate BMI.  Past Psychiatric History:    Is the patient at risk to self? No  Has the patient been a risk to self in the past 6 months? No .    Has the patient been a risk to self within the distant past? No   Is the patient a risk to others? No   Has the patient been a risk to others in the past 6 months? No   Has the patient been a risk to others within the distant past? No   Past Medical History:  Past Medical History:  Diagnosis Date   COVID-19    Homelessness    Hypercholesterolemia    Mental disorder    Paranoid behavior ([place])    PE (pulmonary embolism)    Schizophrenia ([place])    No past surgical history on file.  Family History:  Family History  Problem Relation Age of Onset   Hypertension Mother     Social History:  Social History   Socioeconomic History   Marital status: Single    Spouse name: Not on file   Number of children: Not on file   Years of education: Not on file   Highest education level: Not on file  Occupational History   Occupation: Employed    Comment: [employer]  Tobacco Use   Smoking status: Every Day    Packs/day: 0.50    Types: Cigarettes   Smokeless tobacco: Never  Vaping Use   Vaping Use: Never used  Substance and Sexual Activity   Alcohol use: Yes    Comment: BAC not available   Drug use: Yes    Types: Marijuana    Comment: Last used: 2 months ago UDS NA   Sexual activity: Never  Other Topics Concern   Not on file  Social History Narrative   ** Merged History Encounter **    Pt lives alone in [place].  He is employed.  He stated that he is followed by  [facility], but that he has not been there in a while.   Social Determinants of Health   Financial Resource Strain: Not on file  Food Insecurity: Not on file  Transportation Needs: Not on file  Physical Activity: Not on file  Stress: Not on file  Social Connections: Not on file  Intimate Partner Violence: Not on file    SDOH:  SDOH Screenings   Alcohol Screen: Low Risk  (08/31/2019)  Depression (PHQ2-9): Medium Risk (12/23/2021)  Tobacco Use: High Risk (10/01/2022)    Last Labs:  Admission on 10/01/2022, Discharged on 10/01/2022  Component Date Value Ref Range Status   Sodium 10/01/2022 142  135 - 145 mmol/L Final   Potassium 10/01/2022 3.6  3.5 - 5.1 mmol/L Final   Chloride 10/01/2022 104  98 - 111 mmol/L Final   CO2  10/01/2022 21 (L)  22 - 32 mmol/L Final   Glucose, Bld 10/01/2022 106 (H)  70 - 99 mg/dL Final   Glucose reference range applies only to samples taken after fasting for at least 8 hours.   BUN 10/01/2022 10  6 - 20 mg/dL Final   Creatinine, Ser 10/01/2022 0.84  0.61 - 1.24 mg/dL Final   Calcium 10/01/2022 9.3  8.9 - 10.3 mg/dL Final   Total Protein 10/01/2022 6.9  6.5 - 8.1 g/dL Final   Albumin 10/01/2022 3.7  3.5 - 5.0 g/dL Final   AST 10/01/2022 20  15 - 41 U/L Final   ALT 10/01/2022 32  0 - 44 U/L Final   Alkaline Phosphatase 10/01/2022 43  38 - 126 U/L Final   Total Bilirubin 10/01/2022 0.5  0.3 - 1.2 mg/dL Final   GFR, Estimated 10/01/2022 >60  >60 mL/min Final   Comment: (NOTE) Calculated using the CKD-EPI Creatinine Equation (2021)    Anion gap 10/01/2022 17 (H)  5 - 15 Final   Performed at [facility] [address] [place], [place] [address]   Alcohol, Ethyl (B) 10/01/2022 64 (H)  <10 mg/dL Final   Comment: (NOTE) Lowest detectable limit for serum alcohol is 10 mg/dL.  For medical purposes only. Performed at [facility], [place] [address] [place], [place] [address]    WBC 10/01/2022 4.9  4.0 - 10.5 K/uL Final   RBC 10/01/2022 4.17  (L)  4.22 - 5.81 MIL/uL Final   Hemoglobin 10/01/2022 11.6 (L)  13.0 - 17.0 g/dL Final   HCT 10/01/2022 36.3 (L)  39.0 - 52.0 % Final   MCV 10/01/2022 87.1  80.0 - 100.0 fL Final   MCH 10/01/2022 27.8  26.0 - 34.0 pg Final   MCHC 10/01/2022 32.0  30.0 - 36.0 g/dL Final   RDW 10/01/2022 12.9  11.5 - 15.5 % Final   Platelets 10/01/2022 355  150 - 400 K/uL Final   nRBC 10/01/2022 0.0  0.0 - 0.2 % Final   Performed at [facility], [place] [address], [place], [place] [address]   Opiates 10/01/2022 NONE DETECTED  NONE DETECTED Final   Cocaine 10/01/2022 NONE DETECTED  NONE DETECTED Final   Benzodiazepines 10/01/2022 NONE DETECTED  NONE DETECTED Final   Amphetamines 10/01/2022 NONE DETECTED  NONE DETECTED Final   Tetrahydrocannabinol 10/01/2022 NONE DETECTED  NONE DETECTED Final   Barbiturates 10/01/2022 NONE DETECTED  NONE DETECTED Final   Comment: (NOTE) DRUG SCREEN FOR MEDICAL PURPOSES ONLY.  IF CONFIRMATION IS NEEDED FOR ANY PURPOSE, NOTIFY LAB WITHIN 5 DAYS.  LOWEST DETECTABLE LIMITS FOR URINE DRUG SCREEN Drug Class                     Cutoff (ng/mL) Amphetamine and metabolites    1000 Barbiturate and metabolites    200 Benzodiazepine                 200 Opiates and metabolites        300 Cocaine and metabolites        300 THC                            50 Performed at [facility], [place] [address] [place], [place] [id number]    Salicylate Lvl [id number] <7.0 (L)  7.0 - 30.0 mg/dL Final   Performed at [facility]  [address] [place], [place] [address]   Acetaminophen (Tylenol), Serum 10/01/2022 <10 (L)  10 - 30 ug/mL Final   Comment: (NOTE) Therapeutic concentrations vary significantly. A range of 10-30 ug/mL  may be an effective concentration for many patients. However, some  are best treated at concentrations outside of this range. Acetaminophen concentrations >150 ug/mL at 4 hours after ingestion  and >50 ug/mL at 12 hours after ingestion are often  associated with  toxic reactions.  Performed at [facility], [place] [address], [place], [place] [address]    SARS Coronavirus 2 by RT PCR 10/01/2022 NEGATIVE  NEGATIVE Final   Comment: (NOTE) SARS-CoV-2 target nucleic acids are NOT DETECTED.  The SARS-CoV-2 RNA is generally detectable in upper respiratory specimens during the acute phase of infection. The lowest concentration of SARS-CoV-2 viral copies this assay can detect is 138 copies/mL. A negative result does not preclude SARS-Cov-2 infection and should not be used as the sole basis for treatment or other patient management decisions. A negative result may occur with  improper specimen collection/handling, submission of specimen other than nasopharyngeal swab, presence of viral mutation(s) within the areas targeted by this assay, and inadequate number of viral copies(<138 copies/mL). A negative result must be combined with clinical observations, patient history, and epidemiological information. The expected result is Negative.  Fact Sheet for Patients:  EntrepreneurPulse.com.au  Fact Sheet for Healthcare Providers:  IncredibleEmployment.be  This test is no                          t yet approved or cleared by the [place] FDA and  has been authorized for detection and/or diagnosis of SARS-CoV-2 by FDA under an Emergency Use Authorization (EUA). This EUA will remain  in effect (meaning this test can be used) for the duration of the COVID-19 declaration under Section 564(b)(1) of the Act, 21 U.S.C.section 360bbb-3(b)(1), unless the authorization is terminated  or revoked sooner.       Influenza A by PCR 10/01/2022 NEGATIVE  NEGATIVE Final   Influenza B by PCR 10/01/2022 NEGATIVE  NEGATIVE Final   Comment: (NOTE) The Xpert Xpress SARS-CoV-2/FLU/RSV plus assay is intended as an aid in the diagnosis of influenza from Nasopharyngeal swab specimens and should not be used as a  sole basis for treatment. Nasal washings and aspirates are unacceptable for Xpert Xpress SARS-CoV-2/FLU/RSV testing.  Fact Sheet for Patients: EntrepreneurPulse.com.au  Fact Sheet for Healthcare Providers: IncredibleEmployment.be  This test is not yet approved or cleared by the [place] FDA and has been authorized for detection and/or diagnosis of SARS-CoV-2 by FDA under an Emergency Use Authorization (EUA). This EUA will remain in effect (meaning this test can be used) for the duration of the COVID-19 declaration under Section 564(b)(1) of the Act, 21 U.S.C. section 360bbb-3(b)(1), unless the authorization is terminated or revoked.  Performed at [facility], [place] [address] [place], [place] [address]     Allergies: Patient has no known allergies.  PTA Medications: (Not in a hospital admission)   Medical Decision Making  Recommend overnight observation Increase risperidone 1 mg twice daily to 2 mg p.o. twice daily    Recommendations  Based on my evaluation the patient does not appear to have an emergency medical condition.  Derrill Center, NP 10/01/22  4:17 PM

## 2022-10-01 NOTE — ED Notes (Signed)
Pt resting quietly, spoke minimally with this writer before turning over in bed.  Denies SI, HI and VH.  Reports AH but did not elaborate on what he was hearing.  Breathing is even and unlabored.  Will continue to monitor for safety.

## 2022-10-01 NOTE — ED Notes (Signed)
Ed transfer/carelink and voluntary consent form done safe tx called to take pt to [facility]

## 2022-10-01 NOTE — ED Notes (Signed)
Report given to behavioral health inpatient

## 2022-10-01 NOTE — ED Triage Notes (Signed)
Pt ambulatory to triage, stating that he "needs to go to behavioral health for an evaluation." Pt says that he last had his Risperdal dose this morning, but it is not helping. He says that his voices are getting bad, telling them they are going to hurt him. The pt denies SI or HI. Falls asleep during triage, reporting he did have some alcohol yesterday. C/o headache.

## 2022-10-02 MED ORDER — RISPERIDONE 2 MG PO TABS: 2.0000 mg | ORAL_TABLET | Freq: Two times a day (BID) | ORAL | 0 refills | Status: DC

## 2022-10-02 NOTE — Discharge Instructions (Signed)

## 2022-10-02 NOTE — ED Notes (Signed)
Pt resting quietly with eyes closed.  No pain or discomfort noted/voiced.  Breathing is even and unlabored.  Will continue to monitor for safety.  

## 2022-10-02 NOTE — ED Provider Notes (Signed)
FBC/OBS ASAP Discharge Summary  Date and Time: 10/02/2022 10:13 AM  Name: Cole Barrett  MRN:  [id number]   Discharge Diagnoses:  Final diagnoses:  Auditory hallucinations    Subjective:  Cole Barrett reported " I am feeling better."  He is denying auditory or visual hallucination. He continues to deny suicidal or homicidal ideation.  Patient is requesting to stay a "few more days."  This provider adjusted medication during this admission. Discussed titrating risperidone 1 mg p.o. twice daily to 2 mg p.o. twice daily, will monitor symptoms.  Cole Barrett is denying depression or derepress symptoms. He is requesting to discharge after lunch and stated he will need a bus pass.    Cole Barrett is sitting  in no acute distress. He is alert/oriented x 3; calm/cooperative; and mood congruent with affect. He is speaking in a clear tone at moderate volume, and normal pace; with good eye contact. His  thought process is coherent and relevant; There is no indication that he is currently responding to internal/external stimuli or experiencing delusional thought content; and he has denied suicidal/self-harm/homicidal ideation, psychosis, and paranoia.  Patient has remained calm throughout assessment and has answered questions appropriately.     Stay Summary:   Total Time spent with patient: 15 minutes  Past Psychiatric History:  Past Medical History:  Past Medical History:  Diagnosis Date   COVID-19    Homelessness    Hypercholesterolemia    Mental disorder    Paranoid behavior ([facility])    PE (pulmonary embolism)    Schizophrenia ([facility])    No past surgical history on file. Family History:  Family History  Problem Relation Age of Onset   Hypertension Mother    Family Psychiatric History:  Social History:  Social History   Substance and Sexual Activity  Alcohol Use Yes   Comment: BAC not available     Social History   Substance and Sexual Activity  Drug Use Yes    Types: Marijuana   Comment: Last used: 2 months ago UDS NA    Social History   Socioeconomic History   Marital status: Single    Spouse name: Not on file   Number of children: Not on file   Years of education: Not on file   Highest education level: Not on file  Occupational History   Occupation: Employed    Comment: [employer]  Tobacco Use   Smoking status: Every Day    Packs/day: 0.50    Types: Cigarettes   Smokeless tobacco: Never  Vaping Use   Vaping Use: Never used  Substance and Sexual Activity   Alcohol use: Yes    Comment: BAC not available   Drug use: Yes    Types: Marijuana    Comment: Last used: 2 months ago UDS NA   Sexual activity: Never  Other Topics Concern   Not on file  Social History Narrative   ** Merged History Encounter **    Pt lives alone in [place].  He is employed.  He stated that he is followed by [facility], but that he has not been there in a while.   Social Determinants of Health   Financial Resource Strain: Not on file  Food Insecurity: Not on file  Transportation Needs: Not on file  Physical Activity: Not on file  Stress: Not on file  Social Connections: Not on file   SDOH:  SDOH Screenings   Alcohol Screen: Low Risk  (08/31/2019)  Depression (PHQ2-9): Medium Risk (12/23/2021)  Tobacco  Use: High Risk (10/01/2022)    Tobacco Cessation:  N/A, patient does not currently use tobacco products  Current Medications:  Current Facility-Administered Medications  Medication Dose Route Frequency Provider Last Rate Last Admin   acetaminophen (TYLENOL) tablet 650 mg  650 mg Oral Q6H PRN Oneta Rack, NP       alum & mag hydroxide-simeth (MAALOX/MYLANTA) 200-200-20 MG/5ML suspension 30 mL  30 mL Oral Q4H PRN Oneta Rack, NP       magnesium hydroxide (MILK OF MAGNESIA) suspension 30 mL  30 mL Oral Daily PRN Oneta Rack, NP       risperiDONE (RISPERDAL) tablet 2 mg  2 mg Oral BID Oneta Rack, NP   2 mg at 10/02/22 0948    traZODone (DESYREL) tablet 50 mg  50 mg Oral QHS PRN Oneta Rack, NP   50 mg at 10/01/22 2101   Current Outpatient Medications  Medication Sig Dispense Refill   bacitracin ointment Apply 1 Application topically 2 (two) times daily. (Patient not taking: Reported on 10/01/2022) 120 g 0   mupirocin cream (BACTROBAN) 2 % Apply 1 Application topically 2 (two) times daily. (Patient not taking: Reported on 10/01/2022) 15 g 0   risperiDONE (RISPERDAL) 1 MG tablet Take 1 tablet (1 mg total) by mouth at bedtime. (Patient taking differently: Take 1 mg by mouth 2 (two) times daily.) 30 tablet 0   risperiDONE (RISPERDAL) 2 MG tablet Take 1 tablet (2 mg total) by mouth 2 (two) times daily. 30 tablet 0   Facility-Administered Medications Ordered in Other Encounters  Medication Dose Route Frequency Provider Last Rate Last Admin   acetaminophen (TYLENOL) tablet 650 mg  650 mg Oral Q6H PRN [name], NP       alum & mag hydroxide-simeth (MAALOX/MYLANTA) 200-200-20 MG/5ML suspension 30 mL  30 mL Oral Q4H PRN Ophelia Shoulder E, NP       magnesium hydroxide (MILK OF MAGNESIA) suspension 30 mL  30 mL Oral Daily PRN [name], NP        PTA Medications: (Not in a hospital admission)      12/23/2021    4:06 AM 12/26/2020    3:48 AM 06/06/2020   12:40 AM  Depression screen PHQ 2/9  Decreased Interest 1 0 0  Down, Depressed, Hopeless 1 0 0  PHQ - 2 Score 2 0 0  Altered sleeping 1 0 0  Tired, decreased energy 1 0 0  Change in appetite 0 0 0  Feeling bad or failure about yourself  1 0 0  Trouble concentrating 1 0 0  Moving slowly or fidgety/restless 0 0 0  Suicidal thoughts 0 0 0  PHQ-9 Score 6 0 0  Difficult doing work/chores Somewhat difficult      Flowsheet Row ED from 10/01/2022 in [facility] Most recent reading at 10/02/2022  7:34 AM ED from 10/01/2022 in [facility] EMERGENCY DEPARTMENT Most recent reading at 10/01/2022  5:42 AM ED from 09/30/2022  in [facility] EMERGENCY DEPARTMENT Most recent reading at 09/30/2022  6:04 AM  C-SSRS RISK CATEGORY No Risk No Risk No Risk       Musculoskeletal  Strength & Muscle Tone: within normal limits Gait & Station: normal Patient leans: N/A  Psychiatric Specialty Exam  Presentation  General Appearance:  Casual  Eye Contact: Good  Speech: Clear and Coherent; Normal Rate  Speech Volume: Normal  Handedness: Right   Mood and Affect  Mood: Depressed  Affect: Congruent   Thought Process  Thought Processes: Coherent  Descriptions of Associations:Intact  Orientation:Full (Time, Place and Person)  Thought Content:Logical  Diagnosis of Schizophrenia or Schizoaffective disorder in past: Yes  Duration of Psychotic Symptoms: Greater than six months   Hallucinations:No data recorded Ideas of Reference:None  Suicidal Thoughts:No data recorded Homicidal Thoughts:No data recorded  Sensorium  Memory: Immediate Fair; Recent Fair; Remote Fair  Judgment: Good  Insight: Good   Executive Functions  Concentration: Good  Attention Span: Good  Recall: Good  Fund of Knowledge: Good  Language: Good   Psychomotor Activity  Psychomotor Activity:No data recorded  Assets  Assets: Communication Skills; Desire for Improvement; Housing; Physical Health; Social Support   Sleep  Sleep:No data recorded  No data recorded  Physical Exam  Physical Exam ROS Blood pressure (!) 127/52, pulse 72, temperature 98.4 F (36.9 C), temperature source Oral, resp. rate 18, SpO2 97 %. There is no height or weight on file to calculate BMI.  Demographic Factors:  Male and Low socioeconomic status  Loss Factors: Legal issues and Financial problems/change in socioeconomic status  Historical Factors: Family history of mental illness or substance abuse  Risk Reduction Factors:   Positive therapeutic relationship  Continued Clinical Symptoms:  Severe  Anxiety and/or Agitation Schizophrenia:   Command hallucinatons  Cognitive Features That Contribute To Risk:  Closed-mindedness    Suicide Risk:  Minimal: No identifiable suicidal ideation.  Patients presenting with no risk factors but with morbid ruminations; may be classified as minimal risk based on the severity of the depressive symptoms  Plan Of Care/Follow-up recommendations:  Activity:  as tolerated Diet:  heart healthy   Increased Risperdal 1 mg to 2 mg BID during this admission Patient was provided with Mupirocin at discharge  Disposition: Take all of you medications as prescribed by your mental healthcare provider.  Report any adverse effects and reactions from your medications to your outpatient provider promptly.  Do not engage in alcohol and or illegal drug use while on prescription medicines.  Keep all scheduled appointments. This is to ensure that you are getting refills on time and to avoid any interruption in your medication.  If you are unable to keep an appointment call to reschedule.  Be sure to follow up with resources and follow ups given.  In the event of worsening symptoms call the crisis hotline, 911, and or go to the nearest emergency department for appropriate evaluation and treatment of symptoms. Follow-up with your primary care provider for your medical issues, concerns and or health care needs.    Derrill Center, NP 10/02/2022, 10:13 AM

## 2022-10-05 ENCOUNTER — Other Ambulatory Visit: Payer: Self-pay

## 2022-10-05 ENCOUNTER — Emergency Department (HOSPITAL_COMMUNITY)
Admission: EM | Admit: 2022-10-05 | Discharge: 2022-10-06 | Discharge status: Against Medical Advice | Payer: Self-pay | Attending: Emergency Medicine | Admitting: Emergency Medicine

## 2022-10-05 ENCOUNTER — Encounter (HOSPITAL_COMMUNITY): Payer: Self-pay | Admitting: *Deleted

## 2022-10-05 DIAGNOSIS — R21 Rash and other nonspecific skin eruption: Secondary | ICD-10-CM | POA: Insufficient documentation

## 2022-10-05 NOTE — ED Provider Triage Note (Signed)
Emergency Medicine Provider Triage Evaluation Note  Cole Barrett , a 36 y.o. male  was evaluated in triage.  Pt complains of rash  which has been ongoing for some time. He reports applying bacitracin to the area with no improvement in symptoms.   Review of Systems  Positive: rash Negative: Fever, nausea, vomiting  Physical Exam  There were no vitals taken for this visit. Gen:   Awake, no distress   Resp:  Normal effort  MSK:   Moves extremities without difficulty  Other:  Extensive rash to the right neck noted, patient covering with his jacket  Medical Decision Making  Medically screening exam initiated at 2:54 PM.  Appropriate orders placed.  Cole Barrett was informed that the remainder of the evaluation will be completed by another provider, this initial triage assessment does not replace that evaluation, and the importance of remaining in the ED until their evaluation is complete.     Claude Manges, PA-C 10/05/22 1505

## 2022-10-05 NOTE — ED Triage Notes (Signed)
Rash to the back of his neck for 2 months he wants antibiotic cream

## 2022-10-06 NOTE — ED Notes (Signed)
Pt called multiple times no answer 

## 2022-10-07 ENCOUNTER — Encounter (HOSPITAL_COMMUNITY): Payer: Self-pay

## 2022-10-07 ENCOUNTER — Emergency Department (HOSPITAL_COMMUNITY)
Admission: EM | Admit: 2022-10-07 | Discharge: 2022-10-07 | Disposition: A | Discharge status: Home / Self Care | Payer: Self-pay | Attending: Emergency Medicine | Admitting: Emergency Medicine

## 2022-10-07 ENCOUNTER — Other Ambulatory Visit: Payer: Self-pay

## 2022-10-07 DIAGNOSIS — Z79899 Other long term (current) drug therapy: Secondary | ICD-10-CM

## 2022-10-07 DIAGNOSIS — Z5900 Homelessness unspecified: Secondary | ICD-10-CM | POA: Insufficient documentation

## 2022-10-07 DIAGNOSIS — Z760 Encounter for issue of repeat prescription: Secondary | ICD-10-CM | POA: Insufficient documentation

## 2022-10-07 DIAGNOSIS — R21 Rash and other nonspecific skin eruption: Secondary | ICD-10-CM | POA: Insufficient documentation

## 2022-10-07 DIAGNOSIS — Z765 Malingerer [conscious simulation]: Secondary | ICD-10-CM | POA: Insufficient documentation

## 2022-10-07 MED ORDER — RISPERIDONE 1 MG PO TABS
2.0000 mg | ORAL_TABLET | Freq: Once | ORAL | Status: AC
  Administered 2022-10-07: 2 mg via ORAL
  Filled 2022-10-07: qty 2

## 2022-10-07 MED ORDER — HYDROCORTISONE 1 % EX CREA
TOPICAL_CREAM | Freq: Two times a day (BID) | CUTANEOUS | Status: DC
  Filled 2022-10-07: qty 28

## 2022-10-07 MED ORDER — HYDROCORTISONE 1 % EX CREA
TOPICAL_CREAM | Freq: Once | CUTANEOUS | Status: AC
  Administered 2022-10-07: 1 via TOPICAL
  Filled 2022-10-07: qty 28

## 2022-10-07 NOTE — ED Triage Notes (Signed)
Pt. Reports that he needs hydrocortisone cream for his neck.  He is out. Pt. Has a rash

## 2022-10-07 NOTE — ED Provider Notes (Signed)
[facility] EMERGENCY DEPARTMENT Provider Note   CSN: [id number] Arrival date & time: 10/07/22  0844    History  Chief Complaint  Patient presents with   Rash    Cole Barrett is a 36 y.o. male here for evaluation of rash. States chronic rash. Located to various areas. No new lotions, perfumes, detergents. Rash without drainage. Not painful. Not located to mouth, hands. Typically get hydrocortisone. States he took his Risperdal today. No delusional parasitosis. No new complaints. States rash gets worse when he picks his skin.  HPI     Home Medications Prior to Admission medications   Medication Sig Start Date End Date Taking? Authorizing Provider  bacitracin ointment Apply 1 Application topically 2 (two) times daily. Patient not taking: Reported on 10/01/2022 08/21/22   Gareth Eagle, PA-C  mupirocin cream (BACTROBAN) 2 % Apply 1 Application topically 2 (two) times daily. Patient not taking: Reported on 10/01/2022 06/22/22   Marita Kansas, PA-C  risperiDONE (RISPERDAL) 1 MG tablet Take 1 tablet (1 mg total) by mouth at bedtime. Patient taking differently: Take 1 mg by mouth 2 (two) times daily. 01/18/22   Prosperi, Christian H, PA-C  risperiDONE (RISPERDAL) 2 MG tablet Take 1 tablet (2 mg total) by mouth 2 (two) times daily. 10/02/22   Oneta Rack, NP  amantadine (SYMMETREL) 100 MG capsule Take 1 capsule (100 mg total) by mouth 2 (two) times daily. Patient not taking: Reported on 11/04/2019 09/01/19 11/17/19  [name], NP      Allergies    Patient has no known allergies.    Review of Systems   Review of Systems  Constitutional: Negative.   HENT: Negative.    Respiratory: Negative.    Cardiovascular: Negative.   Gastrointestinal: Negative.   Genitourinary: Negative.   Musculoskeletal: Negative.   Skin:  Positive for rash.  Neurological: Negative.   All other systems reviewed and are negative.   Physical Exam Updated Vital Signs BP  (!) 146/79 (BP Location: Right Arm)   Pulse (!) 108   Temp 98.6 F (37 C)   Resp 16   Ht 5\' 10"  (1.778 m)   Wt 99.8 kg   SpO2 94%   BMI 31.57 kg/m  Physical Exam Vitals and nursing note reviewed.  Constitutional:      General: He is not in acute distress.    Appearance: He is well-developed. He is not ill-appearing, toxic-appearing or diaphoretic.  HENT:     Head: Normocephalic and atraumatic.     Nose: Nose normal.     Mouth/Throat:     Mouth: Mucous membranes are moist.  Eyes:     Pupils: Pupils are equal, round, and reactive to light.  Cardiovascular:     Rate and Rhythm: Normal rate and regular rhythm.     Pulses: Normal pulses.     Heart sounds: Normal heart sounds.  Pulmonary:     Effort: Pulmonary effort is normal. No respiratory distress.     Breath sounds: Normal breath sounds.  Abdominal:     General: Bowel sounds are normal. There is no distension.     Palpations: Abdomen is soft.  Musculoskeletal:        General: Normal range of motion.     Cervical back: Normal range of motion and neck supple.  Skin:    General: Skin is warm and dry.     Comments: No obvious rash. No erythema, warmth, fluctuance, induration. No vesicles, target lesions, bulla, desquamated skin.  Neurological:     General: No focal deficit present.     Mental Status: He is alert and oriented to person, place, and time.  Psychiatric:        Attention and Perception: Attention normal.        Mood and Affect: Mood normal.        Speech: Speech normal.        Behavior: Behavior normal. Behavior is cooperative.        Thought Content: Thought content normal.    ED Results / Procedures / Treatments   Labs (all labs ordered are listed, but only abnormal results are displayed) Labs Reviewed - No data to display  EKG None  Radiology No results found.  Procedures Procedures    Medications Ordered in ED Medications  hydrocortisone cream 1 % (has no administration in time range)    ED Course/ Medical Decision Making/ A&P    36 year old well known to the ED as well as myself here for evaluation of refill on his hydrocortisone. Occasionally has itching and picks his skin. Long Psych hx however does not appear to be responding to internal stimuli. No obivous rash currently. No obvious infectious sx. He states he just wants a "tube" of his medicine.  Patient denies any difficulty breathing or swallowing.  Pt has a patent airway without stridor and is handling secretions without difficulty; no angioedema. No blisters, no pustules, no warmth, no draining sinus tracts, no superficial abscesses, no bullous impetigo, no vesicles, no desquamation, no target lesions with dusky purpura or a central bulla. Not tender to touch. No concern for superimposed infection. No concern for SJS, TEN, TSS, tick borne illness, syphilis or other life-threatening condition.   The patient has been appropriately medically screened and/or stabilized in the ED. I have low suspicion for any other emergent medical condition which would require further screening, evaluation or treatment in the ED or require inpatient management.  Patient is hemodynamically stable and in no acute distress.  Patient able to ambulate in department prior to ED.  Evaluation does not show acute pathology that would require ongoing or additional emergent interventions while in the emergency department or further inpatient treatment.  I have discussed the diagnosis with the patient and answered all questions.  Pain is been managed while in the emergency department and patient has no further complaints prior to discharge.  Patient is comfortable with plan discussed in room and is stable for discharge at this time.  I have discussed strict return precautions for returning to the emergency department.  Patient was encouraged to follow-up with PCP/specialist refer to at discharge.                            Medical Decision Making Amount  and/or Complexity of Data Reviewed External Data Reviewed: notes.  Risk OTC drugs. Prescription drug management. Decision regarding hospitalization. Diagnosis or treatment significantly limited by social determinants of health.          Final Clinical Impression(s) / ED Diagnoses Final diagnoses:  Rash  Medication management    Rx / DC Orders ED Discharge Orders     None         Danyla Wattley A, PA-C 10/07/22 0916    Mardene Sayer, MD 10/07/22 [phone]

## 2022-10-07 NOTE — ED Notes (Signed)
Pt A&Ox4 ambulatory at d/c with independent steady gait. Pt verbalized understanding of d/c instructions and follow up care. 

## 2022-10-07 NOTE — ED Provider Notes (Signed)
[name] EMERGENCY DEPARTMENT Provider Note   CSN: [id number] Arrival date & time: 10/07/22  1733     History  Chief Complaint  Patient presents with   Medication Refill    Cole Barrett is a 36 y.o. male very well known to this department asking for hydrocortisone cream for his neck and his evening dose of risperdal. No other complaints. States he tried to go to the pharmacy to get some after he got off work but it was closed.    Medication Refill      Home Medications Prior to Admission medications   Medication Sig Start Date End Date Taking? Authorizing Provider  bacitracin ointment Apply 1 Application topically 2 (two) times daily. Patient not taking: Reported on 10/01/2022 08/21/22   Gareth Eagle, PA-C  mupirocin cream (BACTROBAN) 2 % Apply 1 Application topically 2 (two) times daily. Patient not taking: Reported on 10/01/2022 06/22/22   Marita Kansas, PA-C  risperiDONE (RISPERDAL) 1 MG tablet Take 1 tablet (1 mg total) by mouth at bedtime. Patient taking differently: Take 1 mg by mouth 2 (two) times daily. 01/18/22   Prosperi, Christian H, PA-C  risperiDONE (RISPERDAL) 2 MG tablet Take 1 tablet (2 mg total) by mouth 2 (two) times daily. 10/02/22   Oneta Rack, NP  amantadine (SYMMETREL) 100 MG capsule Take 1 capsule (100 mg total) by mouth 2 (two) times daily. Patient not taking: Reported on 11/04/2019 09/01/19 11/17/19  [name], NP      Allergies    Patient has no known allergies.    Review of Systems   Review of Systems  All other systems reviewed and are negative.   Physical Exam Updated Vital Signs BP 136/75   Pulse 88   Temp 98.9 F (37.2 C) (Oral)   Resp 16   SpO2 98%  Physical Exam Vitals and nursing note reviewed.  Constitutional:      Appearance: Normal appearance.  HENT:     Head: Normocephalic and atraumatic.  Eyes:     Conjunctiva/sclera: Conjunctivae normal.  Pulmonary:     Effort: Pulmonary effort  is normal. No respiratory distress.  Skin:    General: Skin is warm and dry.  Neurological:     Mental Status: He is alert.  Psychiatric:        Mood and Affect: Mood normal.        Behavior: Behavior normal.     ED Results / Procedures / Treatments   Labs (all labs ordered are listed, but only abnormal results are displayed) Labs Reviewed - No data to display  EKG None  Radiology No results found.  Procedures Procedures    Medications Ordered in ED Medications  hydrocortisone cream 1 % (has no administration in time range)  risperiDONE (RISPERDAL) tablet 2 mg (has no administration in time range)    ED Course/ Medical Decision Making/ A&P                           Medical Decision Making Risk Prescription drug management.   Pt is 36 year old male with history of mental disorder, PE, HLD, homelessness, paranoia, schizophrenia who presents to the ER for medication refill.  Per chart review, this is patient's 42nd ER visit in 6 months, most recently this morning.  Normal vitals, in no acute distress.  Given 1% hydrocortisone cream and 2mg  risperdal, as confirmed via chart review. Patient discharged in stable condition.  Final Clinical Impression(s) / ED Diagnoses Final diagnoses:  Medication refill  Malingering    Rx / DC Orders ED Discharge Orders     None      Portions of this report may have been transcribed using voice recognition software. Every effort was made to ensure accuracy; however, inadvertent computerized transcription errors may be present.    Jeanella Flattery 10/07/22 1759    Jacalyn Lefevre, MD 10/07/22 Silva Bandy

## 2022-10-07 NOTE — ED Triage Notes (Signed)
Pt is requesting hydrocortisone cream for his neck as well as a dose of his Risperdal. He states the pharmacy he went to was closed. Denies other complaints.

## 2022-10-08 ENCOUNTER — Other Ambulatory Visit: Payer: Self-pay

## 2022-10-08 ENCOUNTER — Emergency Department (HOSPITAL_COMMUNITY)
Admission: EM | Admit: 2022-10-08 | Discharge: 2022-10-09 | Disposition: A | Discharge status: Home / Self Care | Payer: Self-pay | Attending: Emergency Medicine | Admitting: Emergency Medicine

## 2022-10-08 ENCOUNTER — Encounter (HOSPITAL_COMMUNITY): Payer: Self-pay

## 2022-10-08 DIAGNOSIS — R21 Rash and other nonspecific skin eruption: Secondary | ICD-10-CM | POA: Insufficient documentation

## 2022-10-08 MED ORDER — BACITRACIN ZINC 500 UNIT/GM EX OINT: 1.0000 | TOPICAL_OINTMENT | Freq: Two times a day (BID) | CUTANEOUS | 0 refills | Status: DC

## 2022-10-08 MED ORDER — BACITRACIN ZINC 500 UNIT/GM EX OINT
TOPICAL_OINTMENT | Freq: Once | CUTANEOUS | Status: AC
  Filled 2022-10-08: qty 0.9

## 2022-10-08 NOTE — ED Provider Notes (Signed)
[facility]-EMERGENCY DEPT Provider Note   CSN: [id number] Arrival date & time: 10/08/22  2247     History  Chief Complaint  Patient presents with   Rash    Cole Barrett is a 36 y.o. male, hx of schizophrenia here for a rash on his neck. States it was itchy so he kept scratching it then it started bleeding. Would like antibiotic cream.      Home Medications Prior to Admission medications   Medication Sig Start Date End Date Taking? Authorizing Provider  bacitracin ointment Apply 1 Application topically 2 (two) times daily. 10/08/22  Yes Tineshia Becraft L, PA  mupirocin cream (BACTROBAN) 2 % Apply 1 Application topically 2 (two) times daily. Patient not taking: Reported on 10/01/2022 06/22/22   Marita Kansas, PA-C  risperiDONE (RISPERDAL) 1 MG tablet Take 1 tablet (1 mg total) by mouth at bedtime. Patient taking differently: Take 1 mg by mouth 2 (two) times daily. 01/18/22   Prosperi, Christian H, PA-C  risperiDONE (RISPERDAL) 2 MG tablet Take 1 tablet (2 mg total) by mouth 2 (two) times daily. 10/02/22   Oneta Rack, NP  amantadine (SYMMETREL) 100 MG capsule Take 1 capsule (100 mg total) by mouth 2 (two) times daily. Patient not taking: Reported on 11/04/2019 09/01/19 11/17/19  [name], NP      Allergies    Patient has no known allergies.    Review of Systems   Review of Systems  Constitutional:  Negative for chills and fever.  Skin:  Positive for rash and wound.    Physical Exam Updated Vital Signs BP 136/80 (BP Location: Right Arm)   Pulse (!) 101   Temp 98.2 F (36.8 C) (Oral)   Resp 18   Ht 5\' 10"  (1.778 m)   Wt 99.8 kg   SpO2 99%   BMI 31.57 kg/m  Physical Exam Constitutional:      Comments: +unkempt  HENT:     Head: Normocephalic and atraumatic.     Nose: Nose normal.     Mouth/Throat:     Mouth: Mucous membranes are moist.  Eyes:     Extraocular Movements: Extraocular movements intact.     Pupils: Pupils are  equal, round, and reactive to light.  Cardiovascular:     Rate and Rhythm: Normal rate and regular rhythm.  Pulmonary:     Effort: Pulmonary effort is normal.  Musculoskeletal:        General: Normal range of motion.     Cervical back: Normal range of motion and neck supple.  Skin:    General: Skin is warm.     Comments: +large abrasion to L neck w/excoriations  Neurological:     Mental Status: He is alert.     ED Results / Procedures / Treatments   Labs (all labs ordered are listed, but only abnormal results are displayed) Labs Reviewed - No data to display  EKG None  Radiology No results found.  Procedures Procedures    Medications Ordered in ED Medications  bacitracin ointment (has no administration in time range)    ED Course/ Medical Decision Making/ A&P                           Medical Decision Making Risk OTC drugs.   Patient is a 36 y.o. male, hx of schizophrenia, who presents for rash on neck. Found to have wound on neck likely from scratching (possibly drug induced).  No erythema, edema to site. Applied bacitracin and nonstick pad. Discussed importance of not picking any further and wound care. Bacitracin given to patient and script sent. Discussed return precautions.   Final Clinical Impression(s) / ED Diagnoses Final diagnoses:  Rash    Rx / DC Orders ED Discharge Orders          Ordered    bacitracin ointment  2 times daily        10/08/22 2308              Pete Pelt, PA 10/08/22 2313    Charlynne Pander, MD 10/09/22 1450

## 2022-10-08 NOTE — ED Triage Notes (Signed)
Pt said that he has a rash on his neck and he wants some antibiotic cream.

## 2022-10-08 NOTE — Discharge Instructions (Addendum)
Please keep wound clean and dry. Cleanse daily and apply ointment twice/day.

## 2022-10-09 ENCOUNTER — Emergency Department (HOSPITAL_COMMUNITY)
Admission: EM | Admit: 2022-10-09 | Discharge: 2022-10-09 | Disposition: A | Discharge status: Home / Self Care | Payer: Self-pay | Attending: Emergency Medicine | Admitting: Emergency Medicine

## 2022-10-09 ENCOUNTER — Encounter (HOSPITAL_COMMUNITY): Payer: Self-pay | Admitting: Emergency Medicine

## 2022-10-09 ENCOUNTER — Other Ambulatory Visit: Payer: Self-pay

## 2022-10-09 DIAGNOSIS — T07XXXA Unspecified multiple injuries, initial encounter: Secondary | ICD-10-CM

## 2022-10-09 DIAGNOSIS — X58XXXA Exposure to other specified factors, initial encounter: Secondary | ICD-10-CM | POA: Insufficient documentation

## 2022-10-09 DIAGNOSIS — S1091XA Abrasion of unspecified part of neck, initial encounter: Secondary | ICD-10-CM | POA: Insufficient documentation

## 2022-10-09 MED ORDER — BACITRACIN ZINC 500 UNIT/GM EX OINT
TOPICAL_OINTMENT | CUTANEOUS | Status: AC
  Administered 2022-10-09: 15.7778 via TOPICAL
  Filled 2022-10-09: qty 2.7

## 2022-10-09 MED ORDER — BACITRACIN ZINC 500 UNIT/GM EX OINT
TOPICAL_OINTMENT | Freq: Once | CUTANEOUS | Status: AC
  Administered 2022-10-09: 1 via TOPICAL
  Filled 2022-10-09: qty 28.4

## 2022-10-09 NOTE — ED Provider Notes (Signed)
[facility] EMERGENCY DEPARTMENT Provider Note   CSN: [id number] Arrival date & time: 10/09/22  2251     History  Chief Complaint  Patient presents with   Neck Abrasions    Cole Barrett is a 36 y.o. male.  The history is provided by the patient and medical records.    35 y.o. M here requesting cream for his neck.  He states he had a rash on left side of his neck, was itching so he scratched it and caused it to start bleeding.  States he needs cream and a dressing for it.  He denies fever/chills.  Home Medications Prior to Admission medications   Medication Sig Start Date End Date Taking? Authorizing Provider  bacitracin ointment Apply 1 Application topically 2 (two) times daily. 10/08/22   Small, Brooke L, PA  mupirocin cream (BACTROBAN) 2 % Apply 1 Application topically 2 (two) times daily. Patient not taking: Reported on 10/01/2022 06/22/22   Marita Kansas, PA-C  risperiDONE (RISPERDAL) 1 MG tablet Take 1 tablet (1 mg total) by mouth at bedtime. Patient taking differently: Take 1 mg by mouth 2 (two) times daily. 01/18/22   Prosperi, Christian H, PA-C  risperiDONE (RISPERDAL) 2 MG tablet Take 1 tablet (2 mg total) by mouth 2 (two) times daily. 10/02/22   Oneta Rack, NP  amantadine (SYMMETREL) 100 MG capsule Take 1 capsule (100 mg total) by mouth 2 (two) times daily. Patient not taking: Reported on 11/04/2019 09/01/19 11/17/19  [name], NP      Allergies    Patient has no known allergies.    Review of Systems   Review of Systems  Skin:  Positive for wound.  All other systems reviewed and are negative.   Physical Exam Updated Vital Signs BP 117/73   Pulse 97   Temp 98.4 F (36.9 C)   Resp 18   SpO2 96%   Physical Exam Vitals and nursing note reviewed.  Constitutional:      Appearance: He is well-developed.  HENT:     Head: Normocephalic and atraumatic.  Eyes:     Conjunctiva/sclera: Conjunctivae normal.     Pupils: Pupils  are equal, round, and reactive to light.  Neck:     Comments: Large abrasion to left side of neck, fresh excoriations with some dried blood but no active bleeding, there is no sign of erythema or induration, no drainage Cardiovascular:     Rate and Rhythm: Normal rate and regular rhythm.     Heart sounds: Normal heart sounds.  Pulmonary:     Effort: Pulmonary effort is normal.     Breath sounds: Normal breath sounds.  Abdominal:     General: Bowel sounds are normal.     Palpations: Abdomen is soft.  Musculoskeletal:        General: Normal range of motion.     Cervical back: Normal range of motion.  Skin:    General: Skin is warm and dry.  Neurological:     Mental Status: He is alert and oriented to person, place, and time.     ED Results / Procedures / Treatments   Labs (all labs ordered are listed, but only abnormal results are displayed) Labs Reviewed - No data to display  EKG None  Radiology No results found.  Procedures Procedures    Medications Ordered in ED Medications  bacitracin ointment (1 Application Topical Given 10/09/22 2320)    ED Course/ Medical Decision Making/ A&P  Medical Decision Making Risk OTC drugs.   36 year old male presenting to the ED with abrasion to left side of neck.  States started as a rash and he has been scratching it.  He is requesting cream and new dressing.  Does have large abrasion to left side of his neck with fresh excoriations.  There is dried blood but no active bleeding.  There is no surrounding erythema or induration, no evidence of active cellulitis.  He is afebrile and nontoxic, appears at his baseline.  Wound was cleansed and dressed here, given extra bacitracin and dressing supplies for daily changes.  He was advised to avoid scratching or picking at the area, try to keep as clean as possible.  Return here for new concerns.  Final Clinical Impression(s) / ED Diagnoses Final diagnoses:   Multiple abrasions    Rx / DC Orders ED Discharge Orders     None         Garlon Hatchet, PA-C 10/09/22 2330    Mesner, Barbara Cower, MD 10/10/22 [phone]

## 2022-10-09 NOTE — ED Triage Notes (Signed)
Patient presents with left lateral lower neck skin abrasions/excoriations for several weeks with redness .

## 2022-10-09 NOTE — Discharge Instructions (Signed)
Use the cream we gave you and keep area clean.  Change dressings with supplies. Return here for new concerns.

## 2022-10-11 ENCOUNTER — Emergency Department (HOSPITAL_COMMUNITY)
Admission: EM | Admit: 2022-10-11 | Discharge: 2022-10-11 | Discharge status: Against Medical Advice | Payer: Self-pay | Attending: Emergency Medicine | Admitting: Emergency Medicine

## 2022-10-11 ENCOUNTER — Other Ambulatory Visit: Payer: Self-pay

## 2022-10-11 ENCOUNTER — Encounter (HOSPITAL_COMMUNITY): Payer: Self-pay

## 2022-10-11 DIAGNOSIS — R21 Rash and other nonspecific skin eruption: Secondary | ICD-10-CM | POA: Insufficient documentation

## 2022-10-11 DIAGNOSIS — Z5321 Procedure and treatment not carried out due to patient leaving prior to being seen by health care provider: Secondary | ICD-10-CM | POA: Insufficient documentation

## 2022-10-11 MED ORDER — RISPERIDONE 1 MG PO TABS
2.0000 mg | ORAL_TABLET | Freq: Once | ORAL | Status: AC
  Administered 2022-10-11: 2 mg via ORAL
  Filled 2022-10-11: qty 2

## 2022-10-11 MED ORDER — ACETAMINOPHEN 325 MG PO TABS
650.0000 mg | ORAL_TABLET | Freq: Once | ORAL | Status: AC
  Administered 2022-10-11: 650 mg via ORAL
  Filled 2022-10-11: qty 2

## 2022-10-11 NOTE — ED Notes (Signed)
Pt called multiple times for recheck vitals, no answer not seen in the lobby

## 2022-10-11 NOTE — ED Provider Triage Note (Signed)
Emergency Medicine Provider Triage Evaluation Note  Cole Barrett , a 36 y.o. male  was evaluated in triage.  Pt complains of needing his Risperidone medication. Also requesting some pain medication for the rash on his neck. Has had this for at least a week. States that it is painful.  Review of Systems  Positive: As above Negative: As above  Physical Exam  BP 118/74 (BP Location: Left Arm)   Pulse 92   Temp 98.4 F (36.9 C)   Resp 18   Ht 5\' 10"  (1.778 m)   Wt 99.8 kg   SpO2 96%   BMI 31.57 kg/m  Gen:   Awake, no distress   Resp:  Normal effort  MSK:   Moves extremities without difficulty  Other:  Large area of desquamation and ulceration to the left lateral neck with associated edema and purulence. No active bleeding. No crepitus.   Medical Decision Making  Medically screening exam initiated at 6:16 AM.  Appropriate orders placed.  Eryx Zane was informed that the remainder of the evaluation will be completed by another provider, this initial triage assessment does not replace that evaluation, and the importance of remaining in the ED until their evaluation is complete.  Rash noted to L lateral neck - patient will not allow thorough examination of the area. Will give Tylenol for pain. Risperidone medication given.   [name], PA-C 10/11/22 [phone]

## 2022-10-11 NOTE — ED Triage Notes (Signed)
Pt states he needs his Risperdal dose.  Pt states he has rash on neck that is painful. Pt has bandages around neck.

## 2022-10-13 ENCOUNTER — Encounter (HOSPITAL_COMMUNITY): Payer: Self-pay | Admitting: Emergency Medicine

## 2022-10-13 ENCOUNTER — Emergency Department (HOSPITAL_COMMUNITY)
Admission: EM | Admit: 2022-10-13 | Discharge: 2022-10-13 | Disposition: A | Discharge status: Home / Self Care | Payer: Self-pay | Attending: Emergency Medicine | Admitting: Emergency Medicine

## 2022-10-13 ENCOUNTER — Other Ambulatory Visit: Payer: Self-pay

## 2022-10-13 DIAGNOSIS — Z760 Encounter for issue of repeat prescription: Secondary | ICD-10-CM | POA: Insufficient documentation

## 2022-10-13 DIAGNOSIS — R21 Rash and other nonspecific skin eruption: Secondary | ICD-10-CM | POA: Insufficient documentation

## 2022-10-13 MED ORDER — BACITRACIN ZINC 500 UNIT/GM EX OINT: TOPICAL_OINTMENT | Freq: Once | CUTANEOUS | Status: AC

## 2022-10-13 MED ORDER — BACITRACIN ZINC 500 UNIT/GM EX OINT: 1.0000 | TOPICAL_OINTMENT | Freq: Two times a day (BID) | CUTANEOUS | 10 refills | Status: DC

## 2022-10-13 NOTE — ED Provider Notes (Signed)
[facility]-EMERGENCY DEPT Provider Note   CSN: [id number] Arrival date & time: 10/13/22  0023     History  Chief Complaint  Patient presents with   Medication Refill    Cole Barrett is a 36 y.o. male.  Patient with history of schizophrenia presents today with complaints of medication refill. He states that he has an area on his neck that is itchy and when he scratches it it bleeds. This is a chronic issue. Requesting a refill of his antibiotic ointment that he uses for same. Denies fevers or chills. No other complaints    The history is provided by the patient. No language interpreter was used.  Medication Refill      Home Medications Prior to Admission medications   Medication Sig Start Date End Date Taking? Authorizing Provider  bacitracin ointment Apply 1 Application topically 2 (two) times daily. 10/08/22   Small, Brooke L, PA  mupirocin cream (BACTROBAN) 2 % Apply 1 Application topically 2 (two) times daily. Patient not taking: Reported on 10/01/2022 06/22/22   Marita Kansas, PA-C  risperiDONE (RISPERDAL) 1 MG tablet Take 1 tablet (1 mg total) by mouth at bedtime. Patient taking differently: Take 1 mg by mouth 2 (two) times daily. 01/18/22   Prosperi, Christian H, PA-C  risperiDONE (RISPERDAL) 2 MG tablet Take 1 tablet (2 mg total) by mouth 2 (two) times daily. 10/02/22   Oneta Rack, NP  amantadine (SYMMETREL) 100 MG capsule Take 1 capsule (100 mg total) by mouth 2 (two) times daily. Patient not taking: Reported on 11/04/2019 09/01/19 11/17/19  [name], NP      Allergies    Patient has no known allergies.    Review of Systems   Review of Systems  Skin:  Positive for wound.  All other systems reviewed and are negative.   Physical Exam Updated Vital Signs BP (!) 131/96   Pulse (!) 103   Temp 98.7 F (37.1 C) (Oral)   Resp 18   SpO2 95%  Physical Exam Vitals and nursing note reviewed.  Constitutional:      General: He  is not in acute distress.    Appearance: Normal appearance. He is normal weight. He is not ill-appearing, toxic-appearing or diaphoretic.  HENT:     Head: Normocephalic and atraumatic.  Cardiovascular:     Rate and Rhythm: Normal rate.  Pulmonary:     Effort: Pulmonary effort is normal. No respiratory distress.  Musculoskeletal:        General: Normal range of motion.     Cervical back: Normal range of motion.  Skin:    General: Skin is warm and dry.     Comments: Abrasion noted to L neck w/excoriations. No fluctuance or induration. No drainage. Noninfectious appearing.  Neurological:     General: No focal deficit present.     Mental Status: He is alert.  Psychiatric:        Mood and Affect: Mood normal.        Behavior: Behavior normal.     ED Results / Procedures / Treatments   Labs (all labs ordered are listed, but only abnormal results are displayed) Labs Reviewed - No data to display  EKG None  Radiology No results found.  Procedures Procedures    Medications Ordered in ED Medications  bacitracin ointment (has no administration in time range)    ED Course/ Medical Decision Making/ A&P  Medical Decision Making Risk OTC drugs.   Patient is a 36 y.o. male, hx of schizophrenia, who presents for medicaiton refill of his antibiotic ointment for his rash on his neck.  He is afebrile, nontoxic-appearing, and in no acute distress with reassuring vital signs.  Physical exam reveals wound on neck likely from scratching. No erythema, edema to site. Patient has been here numerous times for similar complaints with 44 visits in the past 6 months.   Applied bacitracin and nonstick pad. Discussed importance of not picking any further and wound care. Bacitracin given to patient and script sent. Discussed return precautions.  Patient understanding and amenable with plan, discharged in stable condition.   Final Clinical Impression(s) / ED  Diagnoses Final diagnoses:  Medication refill    Rx / DC Orders ED Discharge Orders          Ordered    bacitracin ointment  2 times daily        10/13/22 0105          An After Visit Summary was printed and given to the patient.     Nestor Lewandowsky 10/13/22 0106    Palumbo, April, MD 10/13/22 [id number]

## 2022-10-13 NOTE — Discharge Instructions (Signed)
Return if development of any new or worsening symptoms 

## 2022-10-13 NOTE — ED Triage Notes (Signed)
Pt states that he wants hydrocortisone cream for his neck.

## 2022-10-14 ENCOUNTER — Encounter (HOSPITAL_COMMUNITY): Payer: Self-pay

## 2022-10-14 ENCOUNTER — Emergency Department (HOSPITAL_COMMUNITY)
Admission: EM | Admit: 2022-10-14 | Discharge: 2022-10-14 | Disposition: A | Discharge status: Home / Self Care | Payer: Self-pay | Attending: Emergency Medicine | Admitting: Emergency Medicine

## 2022-10-14 ENCOUNTER — Other Ambulatory Visit: Payer: Self-pay

## 2022-10-14 DIAGNOSIS — Z760 Encounter for issue of repeat prescription: Secondary | ICD-10-CM | POA: Insufficient documentation

## 2022-10-14 MED ORDER — BACITRACIN ZINC 500 UNIT/GM EX OINT
TOPICAL_OINTMENT | Freq: Two times a day (BID) | CUTANEOUS | Status: DC
  Filled 2022-10-14: qty 28.4

## 2022-10-14 MED ORDER — ACETAMINOPHEN 500 MG PO TABS
1000.0000 mg | ORAL_TABLET | Freq: Once | ORAL | Status: AC
  Administered 2022-10-14: 1000 mg via ORAL
  Filled 2022-10-14: qty 2

## 2022-10-14 MED ORDER — RISPERIDONE 1 MG PO TABS
2.0000 mg | ORAL_TABLET | Freq: Once | ORAL | Status: AC
  Administered 2022-10-14: 2 mg via ORAL
  Filled 2022-10-14: qty 2

## 2022-10-14 NOTE — ED Triage Notes (Signed)
Pt arrived POV stating he needs more antibiotic cream for his neck, pain medicine and his risperidone.

## 2022-10-14 NOTE — ED Provider Notes (Signed)
[facility] EMERGENCY DEPARTMENT Provider Note   CSN: [id number] Arrival date & time: 10/14/22  4097     History Chief Complaint  Patient presents with   Medication Refill    HPI Cole Barrett is a 36 y.o. male presenting for chief plaint of left-sided chronic neck wound.  He denies fevers or chills, nausea vomiting causing shortness of breath.  He is otherwise ambulatory tolerating p.o. intake.  He has had significant psychosocial history including homelessness and schizophrenia on Risperdal.  He states that he has respite all at home but he spent the night on the street last night and did not make it back to his stuff to take his Risperdal. Reviewed medical, surgical, social, medication list and allergies were reviewed in the Snapshot window as part of the initial history.   Review of Systems   Review of Systems  Constitutional:  Negative for chills and fever.  HENT:  Negative for ear pain and sore throat.   Eyes:  Negative for pain and visual disturbance.  Respiratory:  Negative for cough and shortness of breath.   Cardiovascular:  Negative for chest pain and palpitations.  Gastrointestinal:  Negative for abdominal pain and vomiting.  Genitourinary:  Negative for dysuria and hematuria.  Musculoskeletal:  Negative for arthralgias and back pain.  Skin:  Negative for color change and rash.  Neurological:  Negative for seizures and syncope.  Psychiatric/Behavioral:  Negative for suicidal ideas.   All other systems reviewed and are negative.   Physical Exam Updated Vital Signs BP 132/86 (BP Location: Right Arm)   Pulse 88   Temp 98.4 F (36.9 C)   Resp 14   SpO2 96%  Physical Exam Vitals and nursing note reviewed.  Constitutional:      General: He is not in acute distress.    Appearance: He is well-developed.  HENT:     Head: Normocephalic and atraumatic.  Eyes:     Conjunctiva/sclera: Conjunctivae normal.  Cardiovascular:     Rate and  Rhythm: Normal rate and regular rhythm.     Heart sounds: No murmur heard. Pulmonary:     Effort: Pulmonary effort is normal. No respiratory distress.     Breath sounds: Normal breath sounds.  Abdominal:     Palpations: Abdomen is soft.     Tenderness: There is no abdominal tenderness.  Musculoskeletal:        General: No swelling.     Cervical back: Neck supple.  Skin:    General: Skin is warm and dry.     Capillary Refill: Capillary refill takes less than 2 seconds.     Comments: Patient refused to allow any evaluation of his neck wound.  He stated that it is too painful to have the dressing changed and he changed it this morning.  Neurological:     Mental Status: He is alert.  Psychiatric:        Mood and Affect: Mood normal.      ED Course/ Medical Decision Making/ A&P    Procedures Procedures   Medications Ordered in ED Medications  bacitracin ointment (has no administration in time range)  risperiDONE (RISPERDAL) tablet 2 mg (2 mg Oral Given 10/14/22 0915)  acetaminophen (TYLENOL) tablet 1,000 mg (1,000 mg Oral Given 10/14/22 0916)    Medical Decision Making:    Mubarak Bevens is a 36 y.o. male who presented to the ED today with request for medication refills detailed above.     Patient's presentation is  complicated by their history of multiple comorbid medical problems including psychosocial disruptions.  Patient placed on continuous vitals and telemetry monitoring while in ED which was reviewed periodically.   Complete initial physical exam performed, notably the patient  was hemodynamically stable in no acute distress.  He is psychiatrically guarded, however he is not responding to any internal stimuli, he is answering questions appropriately.  Unfortunately, he refused to allow evaluation of his neck wound because he states it is too painful..      Reviewed and confirmed nursing documentation for past medical history, family history, social history.     Initial Assessment:   Patient's history of present illness and physical exam findings are not consistent with any acute medical condition.  He is requesting medication refills which is reasonable given his psychosocial limitations.  He does not appear to have decompensated schizophrenia.  Unfortunately he did not allow full evaluation of his neck wound, however on chart review, it is a chronic wound and he is reporting appropriate wound care including a bandage change this morning and is requesting more bacitracin ointment.  Provided him with a more bacitracin ointment to continue wound care and strict return precautions.  Vital signs reviewed and patient without evidence of sepsis or any more sinister abnormality at this time.  He is ambulatory was able to take medications in no acute distress and was recommended to follow-up with his primary care provider in the outpatient setting.  Disposition:  I have considered need for hospitalization, however, considering all of the above, I believe this patient is stable for discharge at this time.  Patient/family educated about specific return precautions for given chief complaint and symptoms.  Patient/family educated about follow-up with PCP .     Patient/family expressed understanding of return precautions and need for follow-up. Patient spoken to regarding all imaging and laboratory results and appropriate follow up for these results. All education provided in verbal form with additional information in written form. Time was allowed for answering of patient questions. Patient discharged.    Emergency Department Medication Summary:   Medications  bacitracin ointment (has no administration in time range)  risperiDONE (RISPERDAL) tablet 2 mg (2 mg Oral Given 10/14/22 0915)  acetaminophen (TYLENOL) tablet 1,000 mg (1,000 mg Oral Given 10/14/22 0916)         Clinical Impression:  1. Medication refill      Discharge   Final Clinical  Impression(s) / ED Diagnoses Final diagnoses:  Medication refill    Rx / DC Orders ED Discharge Orders     None         Glyn Ade, MD 10/14/22 [phone]

## 2022-10-16 ENCOUNTER — Encounter (HOSPITAL_COMMUNITY): Payer: Self-pay | Admitting: Emergency Medicine

## 2022-10-16 ENCOUNTER — Other Ambulatory Visit: Payer: Self-pay

## 2022-10-16 ENCOUNTER — Emergency Department (HOSPITAL_COMMUNITY)
Admission: EM | Admit: 2022-10-16 | Discharge: 2022-10-16 | Disposition: A | Discharge status: Home / Self Care | Payer: Self-pay | Attending: Emergency Medicine | Admitting: Emergency Medicine

## 2022-10-16 DIAGNOSIS — Z5900 Homelessness unspecified: Secondary | ICD-10-CM | POA: Insufficient documentation

## 2022-10-16 DIAGNOSIS — R519 Headache, unspecified: Secondary | ICD-10-CM | POA: Insufficient documentation

## 2022-10-16 MED ORDER — IBUPROFEN 200 MG PO TABS
400.0000 mg | ORAL_TABLET | Freq: Once | ORAL | Status: AC
  Administered 2022-10-16: 400 mg via ORAL
  Filled 2022-10-16: qty 2

## 2022-10-16 NOTE — Discharge Instructions (Signed)
Given you shelters within the area  Come back to the emergency department if you develop chest pain, shortness of breath, severe abdominal pain, uncontrolled nausea, vomiting, diarrhea.

## 2022-10-16 NOTE — ED Provider Notes (Signed)
[facility]-EMERGENCY DEPT Provider Note   CSN: [id number] Arrival date & time: 10/16/22  0530     History  Chief Complaint  Patient presents with   Headache    Cole Barrett is a 36 y.o. male.  HPI   Medical history including PE, schizophrenia, homeless presents with complaint of headache.  Patient states headache started today, states in the back of his head, feels like his normal headache, not the worst headache of his life, no paresthesias or weakness in the upper and/or lower extremities, no change in vision, no recent head trauma, not anticoag, denies any chest pain shortness of breath fevers or chills.  He has no other complaints he is also requesting a sandwich and something to drink    Home Medications Prior to Admission medications   Medication Sig Start Date End Date Taking? Authorizing Provider  bacitracin ointment Apply 1 Application topically 2 (two) times daily. 10/13/22   Smoot, Shawn Route, PA-C  mupirocin cream (BACTROBAN) 2 % Apply 1 Application topically 2 (two) times daily. Patient not taking: Reported on 10/01/2022 06/22/22   Marita Kansas, PA-C  risperiDONE (RISPERDAL) 1 MG tablet Take 1 tablet (1 mg total) by mouth at bedtime. Patient taking differently: Take 1 mg by mouth 2 (two) times daily. 01/18/22   Prosperi, Christian H, PA-C  risperiDONE (RISPERDAL) 2 MG tablet Take 1 tablet (2 mg total) by mouth 2 (two) times daily. 10/02/22   Oneta Rack, NP  amantadine (SYMMETREL) 100 MG capsule Take 1 capsule (100 mg total) by mouth 2 (two) times daily. Patient not taking: Reported on 11/04/2019 09/01/19 11/17/19  [name], NP      Allergies    Patient has no known allergies.    Review of Systems   Review of Systems  Constitutional:  Negative for chills and fever.  Respiratory:  Negative for shortness of breath.   Cardiovascular:  Negative for chest pain.  Gastrointestinal:  Negative for abdominal pain.  Neurological:   Positive for headaches.    Physical Exam Updated Vital Signs BP (!) 150/66 (BP Location: Right Arm)   Pulse 76   Temp 98.3 F (36.8 C) (Oral)   Resp 16   Ht 5\' 10"  (1.778 m)   Wt 99.8 kg   SpO2 93%   BMI 31.57 kg/m  Physical Exam Vitals and nursing note reviewed.  Constitutional:      General: He is not in acute distress.    Appearance: He is not ill-appearing.  HENT:     Head: Normocephalic and atraumatic.     Comments: No deformity of the head present no raccoon eyes or battle sign noted.    Nose: No congestion.  Eyes:     Extraocular Movements: Extraocular movements intact.     Conjunctiva/sclera: Conjunctivae normal.     Pupils: Pupils are equal, round, and reactive to light.  Cardiovascular:     Rate and Rhythm: Normal rate and regular rhythm.     Pulses: Normal pulses.     Heart sounds: No murmur heard.    No friction rub. No gallop.  Pulmonary:     Effort: No respiratory distress.     Breath sounds: No wheezing, rhonchi or rales.  Skin:    General: Skin is warm and dry.  Neurological:     Mental Status: He is alert.     GCS: GCS eye subscore is 4. GCS verbal subscore is 5. GCS motor subscore is 6.  Cranial Nerves: Cranial nerves 2-12 are intact.     Motor: No weakness.     Coordination: Coordination is intact. Heel to [facility] Test normal.     Gait: Gait is intact.     Comments: Cranial nerves II through XII grossly intact no difficulty with word finding following two-step commands there is no unilateral weakness present.  Psychiatric:        Mood and Affect: Mood normal.     ED Results / Procedures / Treatments   Labs (all labs ordered are listed, but only abnormal results are displayed) Labs Reviewed - No data to display  EKG None  Radiology No results found.  Procedures Procedures    Medications Ordered in ED Medications  ibuprofen (ADVIL) tablet 400 mg (400 mg Oral Given 10/16/22 0640)    ED Course/ Medical Decision Making/ A&P                            Medical Decision Making  This patient presents to the ED for concern of headache, this involves an extensive number of treatment options, and is a complaint that carries with it a high risk of complications and morbidity.  The differential diagnosis includes CVA, intracranial bleed, meningitis    Additional history obtained:  Additional history obtained from N/A External records from outside source obtained and reviewed including recent ER notes   Co morbidities that complicate the patient evaluation  Schizophrenic  Social Determinants of Health:  Homeless    Lab Tests:  I Ordered, and personally interpreted labs.  The pertinent results include: N/A   Imaging Studies ordered:  I ordered imaging studies including N/A I independently visualized and interpreted imaging which showed N/A I agree with the radiologist interpretation   Cardiac Monitoring:  The patient was maintained on a cardiac monitor.  I personally viewed and interpreted the cardiac monitored which showed an underlying rhythm of: N/A   Medicines ordered and prescription drug management:  I ordered medication including ibuprofen I have reviewed the patients home medicines and have made adjustments as needed  Critical Interventions:  N/A   Reevaluation:  Benign physical exam, patient was provided sandwich, soda, is given ibuprofen and is agreement with discharge at this time.    Consultations Obtained:  N/a   Test Considered:  Ct head-deferred not on anticoag's, no recent head trauma.    Rule out   Low suspicion for CVA she has no focal deficit present my exam.  Low suspicion for dissection of the vertebral or carotid artery as presentation atypical of etiology.  Low suspicion for meningitis as she has no meningeal sign present.     Dispostion and problem list  After consideration of the diagnostic results and the patients response to treatment, I feel that the  patent would benefit from discharge.  Headache-suspect multifactorial likely malingering, he was given food ibuprofen and will have him follow-up with a local shelter.            Final Clinical Impression(s) / ED Diagnoses Final diagnoses:  Bad headache  Homeless    Rx / DC Orders ED Discharge Orders     None         Barnie Del 10/16/22 1324    Tilden Fossa, MD 10/16/22 2350

## 2022-10-16 NOTE — ED Triage Notes (Signed)
Pt c/o headache x 20 minutes. Pt also asked for a sandwich

## 2022-10-17 ENCOUNTER — Emergency Department (HOSPITAL_COMMUNITY)
Admission: EM | Admit: 2022-10-17 | Discharge: 2022-10-17 | Disposition: A | Discharge status: Home / Self Care | Payer: Self-pay | Attending: Emergency Medicine | Admitting: Emergency Medicine

## 2022-10-17 ENCOUNTER — Other Ambulatory Visit: Payer: Self-pay

## 2022-10-17 ENCOUNTER — Encounter (HOSPITAL_COMMUNITY): Payer: Self-pay | Admitting: Emergency Medicine

## 2022-10-17 DIAGNOSIS — Z760 Encounter for issue of repeat prescription: Secondary | ICD-10-CM | POA: Insufficient documentation

## 2022-10-17 DIAGNOSIS — R519 Headache, unspecified: Secondary | ICD-10-CM | POA: Insufficient documentation

## 2022-10-17 MED ORDER — IBUPROFEN 400 MG PO TABS
600.0000 mg | ORAL_TABLET | Freq: Once | ORAL | Status: AC
  Administered 2022-10-17: 600 mg via ORAL
  Filled 2022-10-17: qty 1

## 2022-10-17 MED ORDER — RISPERIDONE 2 MG PO TABS
2.0000 mg | ORAL_TABLET | Freq: Once | ORAL | Status: AC
  Administered 2022-10-17: 2 mg via ORAL
  Filled 2022-10-17: qty 1

## 2022-10-17 NOTE — Discharge Instructions (Signed)
You were seen in the emergency room today for a daily dose of your Risperdal and also for a pain medication for your headache.  You have declined any other labs or imaging or any other further physical exam.  If you change your mind, please come back to the emergency department.  Otherwise we may concerns, new or worsening symptoms, please return to the nearest emergency department.  Please follow-up with your primary care doctor for refills of your Risperdal.

## 2022-10-17 NOTE — ED Provider Notes (Signed)
[name] EMERGENCY DEPARTMENT Provider Note   CSN: [id number] Arrival date & time: 10/17/22  2263     History Chief Complaint  Patient presents with   Medication Refill    Cole Barrett is a 36 y.o. male very well-known to the emergency department requesting his daily dose of Risperdal.  He reports a headache and would like a dose of pain medication, denies any other symptoms.  I asked about his neck wound as he has been here for previously and he has a blanket wrapped around it.  He reports "it straight" and would not like it evaluated. He was seen here yesterday for a headache as well.    Medication Refill      Home Medications Prior to Admission medications   Medication Sig Start Date End Date Taking? Authorizing Provider  bacitracin ointment Apply 1 Application topically 2 (two) times daily. 10/13/22   Smoot, Shawn Route, PA-C  mupirocin cream (BACTROBAN) 2 % Apply 1 Application topically 2 (two) times daily. Patient not taking: Reported on 10/01/2022 06/22/22   Marita Kansas, PA-C  risperiDONE (RISPERDAL) 1 MG tablet Take 1 tablet (1 mg total) by mouth at bedtime. Patient taking differently: Take 1 mg by mouth 2 (two) times daily. 01/18/22   Prosperi, Christian H, PA-C  risperiDONE (RISPERDAL) 2 MG tablet Take 1 tablet (2 mg total) by mouth 2 (two) times daily. 10/02/22   Oneta Rack, NP  amantadine (SYMMETREL) 100 MG capsule Take 1 capsule (100 mg total) by mouth 2 (two) times daily. Patient not taking: Reported on 11/04/2019 09/01/19 11/17/19  [name], NP      Allergies    Patient has no known allergies.    Review of Systems   Review of Systems  Constitutional:  Negative for chills and fever.  Eyes:  Negative for visual disturbance.  Neurological:  Positive for headaches.    Physical Exam Updated Vital Signs BP (!) 144/77 (BP Location: Left Arm)   Pulse 80   Temp 98.4 F (36.9 C) (Oral)   Resp 16   SpO2 91%  Physical  Exam Vitals and nursing note reviewed.  Constitutional:      General: He is not in acute distress.    Appearance: He is not ill-appearing or toxic-appearing.  Eyes:     General: No scleral icterus. Neck:     Comments: Declined an examination Pulmonary:     Effort: Pulmonary effort is normal. No respiratory distress.  Musculoskeletal:     Cervical back: Normal range of motion. No rigidity.  Skin:    General: Skin is dry.     Findings: No rash.  Neurological:     General: No focal deficit present.     Mental Status: He is alert. Mental status is at baseline.     Cranial Nerves: No cranial nerve deficit.  Psychiatric:        Mood and Affect: Mood normal.     ED Results / Procedures / Treatments   Labs (all labs ordered are listed, but only abnormal results are displayed) Labs Reviewed - No data to display  EKG None  Radiology No results found.  Procedures Procedures   Medications Ordered in ED Medications  risperiDONE (RISPERDAL) tablet 2 mg (has no administration in time range)  ibuprofen (ADVIL) tablet 600 mg (600 mg Oral Given 10/17/22 0806)    ED Course/ Medical Decision Making/ A&P  Medical Decision Making Risk Prescription drug management.   36 year old male presents the emergency room for evaluation of his nightly Risperdal dose as well as for pain medication for his headache.  He has declined a full neuro exam.  Declines my visual inspection of his neck given his recent neck wound.  He reports that "it straight".  And just would like some Tylenol or ibuprofen.  He reports that this is not a bad headache but would just like some Tylenol or ibuprofen.  Ordered ibuprofen as well as his nightly Risperdal dose.  Encouraged for him to follow-up with his primary care doctor for refills of this medication and for further evaluation.  Has been seen for this problem 47 times in the past 6 months. Patient is stable for discharge.  Final  Clinical Impression(s) / ED Diagnoses Final diagnoses:  Medication refill  Bad headache    Rx / DC Orders ED Discharge Orders     None         Achille Rich, PA-C 10/17/22 1505    Melene Plan, DO 10/17/22 [phone]

## 2022-10-17 NOTE — ED Triage Notes (Addendum)
Pt reported to ED requesting Risperdal. Pt also endorses headache. Denies ant psychiatric symptoms at this time.

## 2022-10-18 ENCOUNTER — Encounter (HOSPITAL_COMMUNITY): Payer: Self-pay | Admitting: Emergency Medicine

## 2022-10-18 ENCOUNTER — Emergency Department (HOSPITAL_COMMUNITY)
Admission: EM | Admit: 2022-10-18 | Discharge: 2022-10-18 | Disposition: A | Discharge status: Home / Self Care | Payer: Self-pay | Attending: Emergency Medicine | Admitting: Emergency Medicine

## 2022-10-18 ENCOUNTER — Other Ambulatory Visit: Payer: Self-pay

## 2022-10-18 DIAGNOSIS — Z79899 Other long term (current) drug therapy: Secondary | ICD-10-CM | POA: Insufficient documentation

## 2022-10-18 DIAGNOSIS — Z760 Encounter for issue of repeat prescription: Secondary | ICD-10-CM | POA: Insufficient documentation

## 2022-10-18 DIAGNOSIS — R519 Headache, unspecified: Secondary | ICD-10-CM | POA: Insufficient documentation

## 2022-10-18 MED ORDER — IBUPROFEN 400 MG PO TABS
600.0000 mg | ORAL_TABLET | Freq: Once | ORAL | Status: AC
  Administered 2022-10-18: 600 mg via ORAL
  Filled 2022-10-18: qty 1

## 2022-10-18 MED ORDER — RISPERIDONE 2 MG PO TABS
2.0000 mg | ORAL_TABLET | Freq: Once | ORAL | Status: AC
  Administered 2022-10-18: 2 mg via ORAL
  Filled 2022-10-18: qty 1

## 2022-10-18 NOTE — Discharge Instructions (Signed)
Please follow-up with your primary care doctor for additional refills.  You can take 600 mg of ibuprofen and 1000 mg of Tylenol every 6 hours to help with your headache.  Please make sure you stay well-hydrated drinking plenty of fluids, mainly water.

## 2022-10-18 NOTE — ED Triage Notes (Signed)
Pt reported to ED requesting risperidone

## 2022-10-18 NOTE — ED Provider Notes (Signed)
[facility] EMERGENCY DEPARTMENT Provider Note   CSN: [id number] Arrival date & time: 10/18/22  9211     History Chief Complaint  Patient presents with   Medication Refill    Cole Barrett is a 36 y.o. male well known to this emergency department presents for his dose of risperidone.  Also complaining of a mild headache. He requests ibuprofen. He has not taken and pain medication other than the ibuprofen he received yesterday. Patient is here frequently for similar complaint/request.  Medication Refill      Home Medications Prior to Admission medications   Medication Sig Start Date End Date Taking? Authorizing Provider  bacitracin ointment Apply 1 Application topically 2 (two) times daily. 10/13/22   Smoot, Shawn Route, PA-C  mupirocin cream (BACTROBAN) 2 % Apply 1 Application topically 2 (two) times daily. Patient not taking: Reported on 10/01/2022 06/22/22   Marita Kansas, PA-C  risperiDONE (RISPERDAL) 1 MG tablet Take 1 tablet (1 mg total) by mouth at bedtime. Patient taking differently: Take 1 mg by mouth 2 (two) times daily. 01/18/22   Prosperi, Christian H, PA-C  risperiDONE (RISPERDAL) 2 MG tablet Take 1 tablet (2 mg total) by mouth 2 (two) times daily. 10/02/22   Oneta Rack, NP  amantadine (SYMMETREL) 100 MG capsule Take 1 capsule (100 mg total) by mouth 2 (two) times daily. Patient not taking: Reported on 11/04/2019 09/01/19 11/17/19  [name], NP      Allergies    Patient has no known allergies.    Review of Systems   Review of Systems  Constitutional:  Negative for chills and fever.  Eyes:  Negative for photophobia and visual disturbance.  Musculoskeletal:  Negative for neck pain.  Neurological:  Positive for headaches. Negative for weakness.    Physical Exam Updated Vital Signs BP (!) 140/86 (BP Location: Left Arm)   Pulse 78   Temp 98.3 F (36.8 C) (Oral)   Resp 16   SpO2 97%  Physical Exam Vitals and nursing note  reviewed.  Constitutional:      General: He is not in acute distress.    Appearance: He is not ill-appearing or toxic-appearing.  Eyes:     General: No scleral icterus. Pulmonary:     Effort: Pulmonary effort is normal. No respiratory distress.  Musculoskeletal:     Cervical back: Normal range of motion.  Skin:    General: Skin is dry.     Findings: No rash.  Neurological:     General: No focal deficit present.     Mental Status: He is alert. Mental status is at baseline.     Gait: Gait normal.     Comments: Refused neurological exam. Patient is speaking in full sentences with ease. Is ambulatory. Moving all extremities. No facial droop noted.   Psychiatric:        Mood and Affect: Mood normal.     ED Results / Procedures / Treatments   Labs (all labs ordered are listed, but only abnormal results are displayed) Labs Reviewed - No data to display  EKG None  Radiology No results found.  Procedures Procedures   Medications Ordered in ED Medications  risperiDONE (RISPERDAL) tablet 2 mg (has no administration in time range)    ED Course/ Medical Decision Making/ A&P                           Medical Decision Making Risk Prescription drug management.  36 year old male presents emergency room for evaluation of medication refill.  Patient requesting his Risperdal and also ibuprofen for his headache.  Does not take any ibuprofen since yesterday when he was given some ibuprofen at the emergency department.  He denies any neurological exam, otherwise he is ambulatory with ease.  Answering questions appropriately with appropriate speech.  No facial droop noted.  He is moving all extremities.  Recommend he stay well-hydrated and take some ibuprofen and Tylenol and 6 hours for his headache.  I doubt any CVA or meningitis.  Patient reports his headache is mild.  His vital signs show slight elevation of his blood pressure otherwise is normal.  Discussed follow-up with PCP for  medication refills.  Patient stable for discharge.  Final Clinical Impression(s) / ED Diagnoses Final diagnoses:  Medication refill    Rx / DC Orders ED Discharge Orders     None         Achille Rich, PA-C 10/18/22 5277    Alvira Monday, MD 10/18/22 1212

## 2022-10-21 ENCOUNTER — Other Ambulatory Visit: Payer: Self-pay

## 2022-10-21 ENCOUNTER — Emergency Department (HOSPITAL_COMMUNITY)
Admission: EM | Admit: 2022-10-21 | Discharge: 2022-10-21 | Disposition: A | Discharge status: Home / Self Care | Payer: Self-pay | Attending: Emergency Medicine | Admitting: Emergency Medicine

## 2022-10-21 DIAGNOSIS — Z5900 Homelessness unspecified: Secondary | ICD-10-CM | POA: Insufficient documentation

## 2022-10-21 DIAGNOSIS — R519 Headache, unspecified: Secondary | ICD-10-CM | POA: Insufficient documentation

## 2022-10-21 DIAGNOSIS — Z760 Encounter for issue of repeat prescription: Secondary | ICD-10-CM | POA: Insufficient documentation

## 2022-10-21 MED ORDER — IBUPROFEN 800 MG PO TABS
800.0000 mg | ORAL_TABLET | Freq: Once | ORAL | Status: AC
  Administered 2022-10-21: 800 mg via ORAL
  Filled 2022-10-21: qty 1

## 2022-10-21 MED ORDER — RISPERIDONE 2 MG PO TABS
2.0000 mg | ORAL_TABLET | Freq: Once | ORAL | Status: AC
  Administered 2022-10-21: 2 mg via ORAL
  Filled 2022-10-21: qty 1

## 2022-10-21 NOTE — ED Triage Notes (Signed)
Pt states he is here for his risperdal and his "pain medication" states he doesn't know the name of the pain medication.  Pt also requesting sandwich and drink.

## 2022-10-21 NOTE — ED Provider Notes (Signed)
[name] EMERGENCY DEPARTMENT Provider Note   CSN: [id number] Arrival date & time: 10/21/22  0222     History  Chief Complaint  Patient presents with   Medication Refill    Cole Barrett is a 36 y.o. male with PMH significant for previous PE, schizoaffective disorder, homelessness who presents with need for risperdone and for moderate headache. Patient without other complaints at this time. He has a recent prescription on file for risperidone but has not filled it, and only wants his medication in the ED at this time.   Medication Refill      Home Medications Prior to Admission medications   Medication Sig Start Date End Date Taking? Authorizing Provider  bacitracin ointment Apply 1 Application topically 2 (two) times daily. 10/13/22   Smoot, Shawn Route, PA-C  mupirocin cream (BACTROBAN) 2 % Apply 1 Application topically 2 (two) times daily. Patient not taking: Reported on 10/01/2022 06/22/22   Marita Kansas, PA-C  risperiDONE (RISPERDAL) 1 MG tablet Take 1 tablet (1 mg total) by mouth at bedtime. Patient taking differently: Take 1 mg by mouth 2 (two) times daily. 01/18/22   Halcyon Heck H, PA-C  risperiDONE (RISPERDAL) 2 MG tablet Take 1 tablet (2 mg total) by mouth 2 (two) times daily. 10/02/22   Oneta Rack, NP  amantadine (SYMMETREL) 100 MG capsule Take 1 capsule (100 mg total) by mouth 2 (two) times daily. Patient not taking: Reported on 11/04/2019 09/01/19 11/17/19  [name], NP      Allergies    Patient has no known allergies.    Review of Systems   Review of Systems  All other systems reviewed and are negative.   Physical Exam Updated Vital Signs BP 136/84 (BP Location: Right Arm)   Pulse 78   Temp 98.4 F (36.9 C) (Oral)   Resp 20   SpO2 98%  Physical Exam Vitals and nursing note reviewed.  Constitutional:      General: He is not in acute distress.    Appearance: Normal appearance.  HENT:     Head: Normocephalic  and atraumatic.  Eyes:     General:        Right eye: No discharge.        Left eye: No discharge.  Cardiovascular:     Rate and Rhythm: Normal rate and regular rhythm.  Pulmonary:     Effort: Pulmonary effort is normal. No respiratory distress.  Musculoskeletal:        General: No deformity.  Skin:    General: Skin is warm and dry.     Comments: Patient with a known chronic neck wound which has been previously evaluated, patient refuses any examination of the wound at this time on my exam.  I was able to get a brief glance of bilateral sides of the neck with no active redness, induration, or purulent drainage noted.  Neurological:     Mental Status: He is alert and oriented to person, place, and time.     Comments: Patient refuses formal neurologic examination at this time.  He is moving all 4 limbs spontaneously and without any facial droop.  Psychiatric:        Mood and Affect: Mood normal.        Behavior: Behavior normal.     ED Results / Procedures / Treatments   Labs (all labs ordered are listed, but only abnormal results are displayed) Labs Reviewed - No data to display  EKG None  Radiology No results found.  Procedures Procedures    Medications Ordered in ED Medications  risperiDONE (RISPERDAL) tablet 2 mg (2 mg Oral Given 10/21/22 0301)  ibuprofen (ADVIL) tablet 800 mg (800 mg Oral Given 10/21/22 0301)    ED Course/ Medical Decision Making/ A&P                           Medical Decision Making Risk Prescription drug management.   This is a 36 year old male who is well-known to this emergency department who presents with concern for moderate headache, as well as need for his risperidone medication.  Patient is overall well-appearing, he does have a chronic known neck wound which he refuses formal examination of today.  Additionally he declines formal neurologic examination.  He is well-appearing on my exam with no evidence of focal neurologic deficits  grossly, moves all 4 limbs spontaneously.  He is otherwise somewhat disheveled.  We will administer one-time dose of risperidone, ibuprofen and discharge patient safely, encouraged him to fill his prescription for risperidone instead of returning frequently to the emergency department.  Patient discharged in stable condition at this time. Final Clinical Impression(s) / ED Diagnoses Final diagnoses:  Medication refill  Acute nonintractable headache, unspecified headache type    Rx / DC Orders ED Discharge Orders     None         Anselmo Pickler, PA-C 10/21/22 0302    Fatima Blank, MD 10/22/22 [phone]

## 2022-10-21 NOTE — ED Notes (Signed)
Discharge instructions discussed with pt. Pt verbalizes understanding. Belongings with pt upon discharge. Pt provided with bus pass.

## 2022-10-21 NOTE — ED Notes (Signed)
Pt states he came to the ED tonight for his risperdal and medicine for a headache. Pt has no other complaints at this time.

## 2022-10-22 ENCOUNTER — Other Ambulatory Visit: Payer: Self-pay

## 2022-10-22 ENCOUNTER — Encounter (HOSPITAL_COMMUNITY): Payer: Self-pay | Admitting: *Deleted

## 2022-10-22 ENCOUNTER — Emergency Department (HOSPITAL_COMMUNITY)
Admission: EM | Admit: 2022-10-22 | Discharge: 2022-10-22 | Disposition: A | Discharge status: Home / Self Care | Payer: Self-pay | Attending: Emergency Medicine | Admitting: Emergency Medicine

## 2022-10-22 DIAGNOSIS — Z5900 Homelessness unspecified: Secondary | ICD-10-CM | POA: Insufficient documentation

## 2022-10-22 DIAGNOSIS — Z760 Encounter for issue of repeat prescription: Secondary | ICD-10-CM | POA: Insufficient documentation

## 2022-10-22 DIAGNOSIS — R519 Headache, unspecified: Secondary | ICD-10-CM | POA: Insufficient documentation

## 2022-10-22 MED ORDER — RISPERIDONE 2 MG PO TABS
2.0000 mg | ORAL_TABLET | Freq: Once | ORAL | Status: AC
  Administered 2022-10-22: 2 mg via ORAL
  Filled 2022-10-22: qty 1

## 2022-10-22 MED ORDER — IBUPROFEN 800 MG PO TABS
800.0000 mg | ORAL_TABLET | Freq: Once | ORAL | Status: AC
  Administered 2022-10-22: 800 mg via ORAL
  Filled 2022-10-22: qty 1

## 2022-10-22 NOTE — Discharge Instructions (Signed)
Evaluation of your headache is overall reassuring.  It is likely a tension type headache.  Recommend conservative treatment at home which includes ibuprofen and Tylenol as needed for your symptoms.  If you have new visual disturbance, facial droop or weakness or numbness of your extremities please return to the emergency department for further evaluation.  Otherwise recommend that you follow-up with your PCP for ongoing headache.  Also noticed in your medication list that you have an active prescription for risperidone.  Advised that you pick it up at your pharmacy at your convenience.

## 2022-10-22 NOTE — ED Triage Notes (Signed)
Headache wants pain med  he's a frequent pt here

## 2022-10-22 NOTE — ED Provider Notes (Signed)
[name] EMERGENCY DEPARTMENT Provider Note   CSN: [id number] Arrival date & time: 10/22/22  1191     History  Chief Complaint  Patient presents with   Headache   HPI Cole Barrett is a 36 y.o. male past medical history of previous PE, schizoaffective disorder and homelessness presenting for risperidone and headache.  States he was seen for the this complaint yesterday and treated with his normal dose of risperidone and ibuprofen for his headache.  Has been going on for 2 weeks.  No fever, vomiting or neck pain.  States it is not the worst headache of his life.  States that the pain is mild but constant.  Did state that the ibuprofen that he received yesterday helped.   Headache      Home Medications Prior to Admission medications   Medication Sig Start Date End Date Taking? Authorizing Provider  bacitracin ointment Apply 1 Application topically 2 (two) times daily. 10/13/22   Smoot, Shawn Route, PA-C  mupirocin cream (BACTROBAN) 2 % Apply 1 Application topically 2 (two) times daily. Patient not taking: Reported on 10/01/2022 06/22/22   Marita Kansas, PA-C  risperiDONE (RISPERDAL) 1 MG tablet Take 1 tablet (1 mg total) by mouth at bedtime. Patient taking differently: Take 1 mg by mouth 2 (two) times daily. 01/18/22   Prosperi, Christian H, PA-C  risperiDONE (RISPERDAL) 2 MG tablet Take 1 tablet (2 mg total) by mouth 2 (two) times daily. 10/02/22   Oneta Rack, NP  amantadine (SYMMETREL) 100 MG capsule Take 1 capsule (100 mg total) by mouth 2 (two) times daily. Patient not taking: Reported on 11/04/2019 09/01/19 11/17/19  [name], NP      Allergies    Patient has no known allergies.    Review of Systems   Review of Systems  Neurological:  Positive for headaches.    Physical Exam Updated Vital Signs BP (!) 159/91 (BP Location: Right Arm)   Pulse (!) 101   Temp 98 F (36.7 C) (Oral)   Resp 18   Ht 5\' 11"  (1.803 m)   Wt 99.8 kg   SpO2  100%   BMI 30.69 kg/m  Physical Exam Vitals and nursing note reviewed.  HENT:     Head: Normocephalic and atraumatic.     Mouth/Throat:     Mouth: Mucous membranes are moist.  Eyes:     General:        Right eye: No discharge.        Left eye: No discharge.     Conjunctiva/sclera: Conjunctivae normal.  Cardiovascular:     Rate and Rhythm: Normal rate and regular rhythm.     Pulses: Normal pulses.     Heart sounds: Normal heart sounds.  Pulmonary:     Effort: Pulmonary effort is normal.     Breath sounds: Normal breath sounds.  Abdominal:     General: Abdomen is flat.     Palpations: Abdomen is soft.  Skin:    General: Skin is warm and dry.  Neurological:     General: No focal deficit present.     Comments: GCS 15. Speech is goal oriented. No deficits appreciated to CN III-XII; symmetric eyebrow raise, no facial drooping, tongue midline. Patient has equal grip strength bilaterally with 5/5 strength against resistance in all major muscle groups bilaterally. Sensation to light touch intact. Patient moves extremities without ataxia. Normal finger-nose-finger. Patient ambulatory with steady gait.  Psychiatric:  Mood and Affect: Mood normal.     ED Results / Procedures / Treatments   Labs (all labs ordered are listed, but only abnormal results are displayed) Labs Reviewed - No data to display  EKG None  Radiology No results found.  Procedures Procedures    Medications Ordered in ED Medications  risperiDONE (RISPERDAL) tablet 2 mg (has no administration in time range)  ibuprofen (ADVIL) tablet 800 mg (has no administration in time range)    ED Course/ Medical Decision Making/ A&P                           Medical Decision Making  Patient presented for headache and refill of his risperidone.  I reviewed his chart which revealed that he was seen for this same complaint yesterday.  Consider meningitis but unlikely given no meningeal signs and no photophobia no  fever.  Considered hypertensive emergency but unlikely given no visual disturbance and reassuring blood pressure without altered mental status.  Considered mass effect but unlikely given no nausea vomiting or seizure or focal neurodeficits on exam.  Symptoms consistent with likely tension type versus migraine headache.  Most likely tension as patient does not endorse photophobia, nausea vomiting or unilateral headache making migraine less likely.  Reviewed his medication list which revealed he has an active prescription for risperidone.  Patient stated that he has been unable to obtain his refill which prompted him to be evaluated here in the emergency department.  Ordered his normal dose of risperidone advised him to pick up his refill at his pharmacy at his earliest convenience.  Treat his headache with Advil.  Discharged and advised to follow-up with PCP.         Final Clinical Impression(s) / ED Diagnoses Final diagnoses:  Nonintractable headache, unspecified chronicity pattern, unspecified headache type  Medication refill    Rx / DC Orders ED Discharge Orders     None         Gareth Eagle, PA-C 10/22/22 0300    Rolan Bucco, MD 10/22/22 1003

## 2022-10-24 ENCOUNTER — Other Ambulatory Visit: Payer: Self-pay

## 2022-10-24 ENCOUNTER — Emergency Department (HOSPITAL_COMMUNITY)
Admission: EM | Admit: 2022-10-24 | Discharge: 2022-10-24 | Disposition: A | Discharge status: Home / Self Care | Payer: Self-pay | Attending: Emergency Medicine | Admitting: Emergency Medicine

## 2022-10-24 DIAGNOSIS — Z760 Encounter for issue of repeat prescription: Secondary | ICD-10-CM | POA: Insufficient documentation

## 2022-10-24 DIAGNOSIS — R519 Headache, unspecified: Secondary | ICD-10-CM | POA: Insufficient documentation

## 2022-10-24 MED ORDER — RISPERIDONE 3 MG PO TABS
3.0000 mg | ORAL_TABLET | Freq: Once | ORAL | Status: AC
  Administered 2022-10-24: 3 mg via ORAL
  Filled 2022-10-24: qty 1

## 2022-10-24 MED ORDER — ACETAMINOPHEN 325 MG PO TABS
650.0000 mg | ORAL_TABLET | Freq: Once | ORAL | Status: AC
  Administered 2022-10-24: 650 mg via ORAL
  Filled 2022-10-24: qty 2

## 2022-10-24 NOTE — Discharge Instructions (Addendum)
Please make sure to pick up your wrist Risperdal refill tomorrow.  The emergency department is not to be utilized for dispensing your medications nightly.  Please get your medications so that you do not have to come to the emergency department for your medicine

## 2022-10-24 NOTE — ED Triage Notes (Signed)
Patient requesting Risperidal 3 mg tab this evening , he adds mild headache .

## 2022-10-24 NOTE — ED Provider Notes (Signed)
[name] EMERGENCY DEPARTMENT Provider Note   CSN: [id number] Arrival date & time: 10/24/22  1953     History  Chief Complaint  Patient presents with   Requesting Risperidal     Cleatis Fandrich is a 36 y.o. male well-known to this emergency department and has than 51 visits in the last 6 months.  He has a past medical history of schizoaffective disorder.  Patient is requesting his Risperdal dose today and is also having a headache.  He has been seen for the exact same complaint multiple nights.  He has no changes in his headache symptoms.  He states that he has been Working too hard to go pick up his Risperdal refills  HPI     Home Medications Prior to Admission medications   Medication Sig Start Date End Date Taking? Authorizing Provider  bacitracin ointment Apply 1 Application topically 2 (two) times daily. 10/13/22   Smoot, Shawn Route, PA-C  mupirocin cream (BACTROBAN) 2 % Apply 1 Application topically 2 (two) times daily. Patient not taking: Reported on 10/01/2022 06/22/22   Marita Kansas, PA-C  risperiDONE (RISPERDAL) 1 MG tablet Take 1 tablet (1 mg total) by mouth at bedtime. Patient taking differently: Take 1 mg by mouth 2 (two) times daily. 01/18/22   Prosperi, Christian H, PA-C  risperiDONE (RISPERDAL) 2 MG tablet Take 1 tablet (2 mg total) by mouth 2 (two) times daily. 10/02/22   Oneta Rack, NP  amantadine (SYMMETREL) 100 MG capsule Take 1 capsule (100 mg total) by mouth 2 (two) times daily. Patient not taking: Reported on 11/04/2019 09/01/19 11/17/19  [name], NP      Allergies    Patient has no known allergies.    Review of Systems   Review of Systems  Physical Exam Updated Vital Signs BP 138/80 (BP Location: Right Arm)   Pulse 93   Temp 98.3 F (36.8 C)   Resp 16   SpO2 91%  Physical Exam Physical Exam  Constitutional: Pt is oriented to person, place, and time. Pt appears well-developed and well-nourished. No distress.   HENT:  Head: Normocephalic and atraumatic.  Mouth/Throat: Oropharynx is clear and moist.  Eyes: Conjunctivae and EOM are normal. Pupils are equal, round, and reactive to light. No scleral icterus.  No horizontal, vertical or rotational nystagmus  Neck: Normal range of motion. Neck supple.  Full active and passive ROM without pain No midline or paraspinal tenderness No nuchal rigidity or meningeal signs  Cardiovascular: Normal rate, regular rhythm and intact distal pulses.   Pulmonary/Chest: Effort normal and breath sounds normal. No respiratory distress. Pt has no wheezes. No rales.  Abdominal: Soft. Bowel sounds are normal. There is no tenderness. There is no rebound and no guarding.  Musculoskeletal: Normal range of motion.  Lymphadenopathy:    No cervical adenopathy.  Neurological: Pt. is alert and oriented to person, place, and time. He has normal reflexes. No cranial nerve deficit.  Exhibits normal muscle tone. Coordination normal.  Mental Status:  Alert, oriented, thought content appropriate. Speech fluent without evidence of aphasia. Able to follow 2 step commands without difficulty.  Cranial Nerves:  II:  Peripheral visual fields grossly normal, pupils equal, round, reactive to light III,IV, VI: ptosis not present, extra-ocular motions intact bilaterally  V,VII: smile symmetric, facial light touch sensation equal VIII: hearing grossly normal bilaterally  IX,X: midline uvula rise  XI: bilateral shoulder shrug equal and strong XII: midline tongue extension  Motor:  5/5 in upper  and lower extremities bilaterally including strong and equal grip strength and dorsiflexion/plantar flexion Sensory: Pinprick and light touch normal in all extremities.  Deep Tendon Reflexes: 2+ and symmetric  Cerebellar: normal finger-to-nose with bilateral upper extremities Gait: normal gait and balance CV: distal pulses palpable throughout   Skin: Skin is warm and dry. No rash noted. Pt is not  diaphoretic.  Psychiatric: Pt has a normal mood and affect. Behavior is normal. Judgment and thought content normal.  Nursing note and vitals reviewed.  ED Results / Procedures / Treatments   Labs (all labs ordered are listed, but only abnormal results are displayed) Labs Reviewed - No data to display  EKG None  Radiology No results found.  Procedures Procedures    Medications Ordered in ED Medications - No data to display  ED Course/ Medical Decision Making/ A&P                           Medical Decision Making  Angelito Hopping presents with headache Given the large differential diagnosis for [name], the decision making in this case is of high complexity.  After evaluating all of the data points in this case, the presentation of Cristobal Advani is NOT consistent with skull fracture, meningitis/encephalitis, SAH/sentinel bleed, Intracranial Hemorrhage (ICH) (subdural/epidural), acute obstructive hydrocephalus, space occupying lesions, CVA, CO Poisoning, Basilar/vertebral artery dissection, preeclampsia, cerebral venous thrombosis, hypertensive emergency, temporal Arteritis, Idiopathic Intracranial Hypertension (pseudotumor cerebri).  Strict return and follow-up precautions have been given by me personally or by detailed written instructions verbalized by nursing staff using the teach back method to patient/family/caregiver.  Data Reviewed/Counseling: I have reviewed the patient's vital signs, nursing notes, and other relevant tests/information. I had a detailed discussion regarding the historical points, exam findings, and any diagnostic results supporting the discharge diagnosis. I also discussed the need for outpatient follow-up and the need to return to the ED if symptoms worsen or if there are any questions or concerns that arise at hom  Patient advised that the emergency department is not to be utilized as a medication dispensary and that  he is to pick his medications up as soon as possible.  I have advised the patient that if I see him in triage again I would be unlikely to give him his medicine to encourage him to go pick his own medications.  Final Clinical Impression(s) / ED Diagnoses Final diagnoses:  Medication refill    Rx / DC Orders ED Discharge Orders     None         Arthor Captain, PA-C 10/24/22 2054    Glendora Score, MD 10/27/22 1416

## 2022-10-26 ENCOUNTER — Emergency Department (HOSPITAL_COMMUNITY)
Admission: EM | Admit: 2022-10-26 | Discharge: 2022-10-26 | Disposition: A | Discharge status: Home / Self Care | Payer: Self-pay | Attending: Emergency Medicine | Admitting: Emergency Medicine

## 2022-10-26 ENCOUNTER — Other Ambulatory Visit: Payer: Self-pay

## 2022-10-26 DIAGNOSIS — R519 Headache, unspecified: Secondary | ICD-10-CM | POA: Insufficient documentation

## 2022-10-26 DIAGNOSIS — Z760 Encounter for issue of repeat prescription: Secondary | ICD-10-CM | POA: Insufficient documentation

## 2022-10-26 DIAGNOSIS — R030 Elevated blood-pressure reading, without diagnosis of hypertension: Secondary | ICD-10-CM | POA: Insufficient documentation

## 2022-10-26 DIAGNOSIS — Z79899 Other long term (current) drug therapy: Secondary | ICD-10-CM | POA: Insufficient documentation

## 2022-10-26 MED ORDER — IBUPROFEN 800 MG PO TABS
800.0000 mg | ORAL_TABLET | Freq: Once | ORAL | Status: AC
  Administered 2022-10-26: 800 mg via ORAL
  Filled 2022-10-26: qty 1

## 2022-10-26 MED ORDER — RISPERIDONE 2 MG PO TABS
2.0000 mg | ORAL_TABLET | Freq: Once | ORAL | Status: AC
  Administered 2022-10-26: 2 mg via ORAL
  Filled 2022-10-26: qty 1

## 2022-10-26 NOTE — ED Provider Notes (Signed)
   [facility]   CSN: [id number] Arrival date & time: 10/26/22  1605     History {Add pertinent medical, surgical, social history, OB history to HPI:1} Chief Complaint  Patient presents with   Medication Refill    Cole Barrett is a 36 y.o. male.   Medication Refill      Home Medications Prior to Admission medications   Medication Sig Start Date End Date Taking? Authorizing Provider  bacitracin ointment Apply 1 Application topically 2 (two) times daily. 10/13/22   Smoot, Shawn Route, PA-C  mupirocin cream (BACTROBAN) 2 % Apply 1 Application topically 2 (two) times daily. Patient not taking: Reported on 10/01/2022 06/22/22   Marita Kansas, PA-C  risperiDONE (RISPERDAL) 1 MG tablet Take 1 tablet (1 mg total) by mouth at bedtime. Patient taking differently: Take 1 mg by mouth 2 (two) times daily. 01/18/22   Prosperi, Christian H, PA-C  risperiDONE (RISPERDAL) 2 MG tablet Take 1 tablet (2 mg total) by mouth 2 (two) times daily. 10/02/22   Oneta Rack, NP  amantadine (SYMMETREL) 100 MG capsule Take 1 capsule (100 mg total) by mouth 2 (two) times daily. Patient not taking: Reported on 11/04/2019 09/01/19 11/17/19  [name], NP      Allergies    Patient has no known allergies.    Review of Systems   Review of Systems  Physical Exam Updated Vital Signs BP (!) 145/83 (BP Location: Left Arm)   Pulse 99   Temp 98.3 F (36.8 C) (Oral)   Resp 15   Ht 5\' 10"  (1.778 m)   Wt 113.4 kg   SpO2 100%   BMI 35.87 kg/m  Physical Exam  ED Results / Procedures / Treatments   Labs (all labs ordered are listed, but only abnormal results are displayed) Labs Reviewed - No data to display  EKG None  Radiology No results found.  Procedures Procedures  {Document cardiac monitor, telemetry assessment procedure when appropriate:1}  Medications Ordered in ED Medications  risperiDONE (RISPERDAL) tablet 2 mg (2 mg Oral Given  10/26/22 1821)  ibuprofen (ADVIL) tablet 800 mg (800 mg Oral Given 10/26/22 1821)    ED Course/ Medical Decision Making/ A&P                           Medical Decision Making Risk Prescription drug management.   ***  {Document critical care time when appropriate:1} {Document review of labs and clinical decision tools ie heart score, Chads2Vasc2 etc:1}  {Document your independent review of radiology images, and any outside records:1} {Document your discussion with family members, caretakers, and with consultants:1} {Document social determinants of health affecting pt's care:1} {Document your decision making why or why not admission, treatments were needed:1} Final Clinical Impression(s) / ED Diagnoses Final diagnoses:  None    Rx / DC Orders ED Discharge Orders     None

## 2022-10-26 NOTE — ED Triage Notes (Signed)
Pt via POV requesting refills of risperidone and pain medication for headache. Pain currently rated 6/10.

## 2022-10-26 NOTE — Discharge Instructions (Signed)
The visit to the emergency department today was reassuring.  Call the number attached to your discharge papers set up care with a primary care provider as they will be able to prescribe the medicine in the long-term.  Please not hesitate to return to emergency department for worrisome signs and symptoms we discussed become apparent.

## 2022-10-28 ENCOUNTER — Encounter (HOSPITAL_COMMUNITY): Payer: Self-pay | Admitting: Emergency Medicine

## 2022-10-28 ENCOUNTER — Emergency Department (HOSPITAL_COMMUNITY)
Admission: EM | Admit: 2022-10-28 | Discharge: 2022-10-28 | Disposition: A | Discharge status: Home / Self Care | Payer: Self-pay | Attending: Emergency Medicine | Admitting: Emergency Medicine

## 2022-10-28 DIAGNOSIS — G8929 Other chronic pain: Secondary | ICD-10-CM | POA: Insufficient documentation

## 2022-10-28 DIAGNOSIS — R519 Headache, unspecified: Secondary | ICD-10-CM | POA: Insufficient documentation

## 2022-10-28 MED ORDER — IBUPROFEN 400 MG PO TABS
600.0000 mg | ORAL_TABLET | Freq: Once | ORAL | Status: AC
  Administered 2022-10-28: 600 mg via ORAL
  Filled 2022-10-28: qty 1

## 2022-10-28 NOTE — ED Triage Notes (Signed)
Pt reports headache x 3 weeks. Requesting pain medicine.

## 2022-10-28 NOTE — ED Provider Notes (Signed)
[name] EMERGENCY DEPARTMENT Provider Note   CSN: [id number] Arrival date & time: 10/28/22  0555     History History of PE not on anticoagulation,, status, alcohol use disorder, schizoaffective disorder, HLD Chief Complaint  Patient presents with   Headache    Cole Barrett is a 36 y.o. male.  Patient presents the ED complaining of headache.  He says has been going on for 2 to 3 weeks.  He has headache every day.  This is not unusual for him.  He also states that he wants to rest his legs a little bit.  He has been seen multiple times for the same issue.  He denies any vision changes, numbness or weakness, gait abnormalities.   Headache      Home Medications Prior to Admission medications   Medication Sig Start Date End Date Taking? Authorizing Provider  bacitracin ointment Apply 1 Application topically 2 (two) times daily. 10/13/22   Smoot, Shawn Route, PA-C  mupirocin cream (BACTROBAN) 2 % Apply 1 Application topically 2 (two) times daily. Patient not taking: Reported on 10/01/2022 06/22/22   Marita Kansas, PA-C  risperiDONE (RISPERDAL) 1 MG tablet Take 1 tablet (1 mg total) by mouth at bedtime. Patient taking differently: Take 1 mg by mouth 2 (two) times daily. 01/18/22   Prosperi, Christian H, PA-C  risperiDONE (RISPERDAL) 2 MG tablet Take 1 tablet (2 mg total) by mouth 2 (two) times daily. 10/02/22   Oneta Rack, NP  amantadine (SYMMETREL) 100 MG capsule Take 1 capsule (100 mg total) by mouth 2 (two) times daily. Patient not taking: Reported on 11/04/2019 09/01/19 11/17/19  [name], NP      Allergies    Patient has no known allergies.    Review of Systems   Review of Systems  Neurological:  Positive for headaches.  All other systems reviewed and are negative.   Physical Exam Updated Vital Signs BP 124/81 (BP Location: Left Arm)   Pulse 100   Temp 98.9 F (37.2 C)   Resp 20   SpO2 98%  Physical Exam Vitals and nursing note  reviewed.  Constitutional:      General: He is not in acute distress.    Appearance: Normal appearance. He is well-developed. He is not ill-appearing, toxic-appearing or diaphoretic.  HENT:     Head: Normocephalic and atraumatic.     Nose: No nasal deformity.     Mouth/Throat:     Lips: Pink. No lesions.  Eyes:     General: Gaze aligned appropriately. No scleral icterus.       Right eye: No discharge.        Left eye: No discharge.     Conjunctiva/sclera: Conjunctivae normal.     Right eye: Right conjunctiva is not injected. No exudate or hemorrhage.    Left eye: Left conjunctiva is not injected. No exudate or hemorrhage. Pulmonary:     Effort: Pulmonary effort is normal. No respiratory distress.  Skin:    General: Skin is warm and dry.  Neurological:     Mental Status: He is alert and oriented to person, place, and time.     GCS: GCS eye subscore is 4. GCS verbal subscore is 5. GCS motor subscore is 6.     Cranial Nerves: No dysarthria or facial asymmetry.     Comments: Patient is moving all extremities spontaneously.  He has equal strength and sensation.  PERRLA.  EOM intact.  Psychiatric:  Mood and Affect: Mood normal.        Speech: Speech normal.        Behavior: Behavior normal. Behavior is cooperative.     ED Results / Procedures / Treatments   Labs (all labs ordered are listed, but only abnormal results are displayed) Labs Reviewed - No data to display  EKG None  Radiology No results found.  Procedures Procedures   Medications Ordered in ED Medications  ibuprofen (ADVIL) tablet 600 mg (has no administration in time range)    ED Course/ Medical Decision Making/ A&P                           Medical Decision Making  Patient is being seen for headache.  He has no neurological symptoms.  Exam is unremarkable.  He has been seen over the past 6 months with various complaints before headache.  He was most recently seen last night for headache and a  medication refill for his Risperdal.  Headaches are not changed from baseline as they are chronic.  Do not feel that CT imaging is warranted today as there is no change in symptoms. I will prescribe Ibuprofen. He does not need redose of his Risperdal until tonight. He has no admission needs. No need for any labwork. He is stable for discharge.  Final Clinical Impression(s) / ED Diagnoses Final diagnoses:  Chronic nonintractable headache, unspecified headache type    Rx / DC Orders ED Discharge Orders     None         Claudie Leach, PA-C 10/28/22 7017    Tegeler, Canary Brim, MD 10/28/22 [phone]

## 2022-10-29 ENCOUNTER — Emergency Department (HOSPITAL_COMMUNITY)
Admission: EM | Admit: 2022-10-29 | Discharge: 2022-10-29 | Disposition: A | Discharge status: Home / Self Care | Payer: Self-pay | Attending: Emergency Medicine | Admitting: Emergency Medicine

## 2022-10-29 ENCOUNTER — Encounter (HOSPITAL_COMMUNITY): Payer: Self-pay

## 2022-10-29 ENCOUNTER — Other Ambulatory Visit: Payer: Self-pay

## 2022-10-29 DIAGNOSIS — Z5900 Homelessness unspecified: Secondary | ICD-10-CM | POA: Insufficient documentation

## 2022-10-29 DIAGNOSIS — Z760 Encounter for issue of repeat prescription: Secondary | ICD-10-CM | POA: Insufficient documentation

## 2022-10-29 DIAGNOSIS — R519 Headache, unspecified: Secondary | ICD-10-CM | POA: Insufficient documentation

## 2022-10-29 MED ORDER — IBUPROFEN 800 MG PO TABS
800.0000 mg | ORAL_TABLET | Freq: Once | ORAL | Status: AC
  Administered 2022-10-29: 800 mg via ORAL
  Filled 2022-10-29: qty 1

## 2022-10-29 MED ORDER — RISPERIDONE 0.5 MG PO TABS: 0.5000 mg | ORAL_TABLET | Freq: Once | ORAL | Status: DC

## 2022-10-29 MED ORDER — BACITRACIN ZINC 500 UNIT/GM EX OINT: TOPICAL_OINTMENT | Freq: Two times a day (BID) | CUTANEOUS | Status: DC

## 2022-10-29 MED ORDER — RISPERIDONE 2 MG PO TABS
2.0000 mg | ORAL_TABLET | Freq: Once | ORAL | Status: AC
  Administered 2022-10-29: 2 mg via ORAL
  Filled 2022-10-29: qty 1

## 2022-10-29 NOTE — ED Provider Notes (Signed)
[facility]-EMERGENCY DEPT Provider Note   CSN: [id number] Arrival date & time: 10/29/22  0321     History  Chief Complaint  Patient presents with   Medication Refill    Cole Barrett is a 36 y.o. male who is well-known to this department for frequent visits for doses of his Risperdal.  Today also endorses mild frontal headache which she has previously taken Tylenol for with improvement.  No blurry double vision, no nausea vomiting fevers chills head trauma or any other infectious symptoms.  Patient has history of homelessness, schizophrenia.  He is calm and cooperative and pleasant today is normal for his patient.   HPI     Home Medications Prior to Admission medications   Medication Sig Start Date End Date Taking? Authorizing Provider  bacitracin ointment Apply 1 Application topically 2 (two) times daily. 10/13/22   Smoot, Shawn Route, PA-C  mupirocin cream (BACTROBAN) 2 % Apply 1 Application topically 2 (two) times daily. Patient not taking: Reported on 10/01/2022 06/22/22   Marita Kansas, PA-C  risperiDONE (RISPERDAL) 1 MG tablet Take 1 tablet (1 mg total) by mouth at bedtime. Patient taking differently: Take 1 mg by mouth 2 (two) times daily. 01/18/22   Prosperi, Christian H, PA-C  risperiDONE (RISPERDAL) 2 MG tablet Take 1 tablet (2 mg total) by mouth 2 (two) times daily. 10/02/22   Oneta Rack, NP  amantadine (SYMMETREL) 100 MG capsule Take 1 capsule (100 mg total) by mouth 2 (two) times daily. Patient not taking: Reported on 11/04/2019 09/01/19 11/17/19  [name], NP      Allergies    Patient has no known allergies.    Review of Systems   Review of Systems  Constitutional: Negative.   HENT: Negative.    Eyes: Negative.   Respiratory: Negative.    Cardiovascular: Negative.   Gastrointestinal: Negative.   Neurological:  Positive for headaches.    Physical Exam Updated Vital Signs BP (!) 142/78   Pulse 84   Temp 98.2 F (36.8 C)    Resp 18   SpO2 99%  Physical Exam Vitals and nursing note reviewed.  Constitutional:      Appearance: He is obese. He is not ill-appearing or toxic-appearing.  HENT:     Head: Normocephalic and atraumatic.  Eyes:     General:        Right eye: No discharge.        Left eye: No discharge.     Extraocular Movements: Extraocular movements intact.     Conjunctiva/sclera: Conjunctivae normal.     Pupils: Pupils are equal, round, and reactive to light.  Neck:     Meningeal: Brudzinski's sign and Kernig's sign absent.   Cardiovascular:     Rate and Rhythm: Normal rate and regular rhythm.     Pulses: Normal pulses.     Heart sounds: Normal heart sounds. No murmur heard. Pulmonary:     Effort: Pulmonary effort is normal. No respiratory distress.     Breath sounds: Normal breath sounds. No wheezing or rales.  Musculoskeletal:        General: No deformity.     Cervical back: Neck supple. No rigidity.  Lymphadenopathy:     Cervical: No cervical adenopathy.  Skin:    General: Skin is warm and dry.     Capillary Refill: Capillary refill takes less than 2 seconds.  Neurological:     General: No focal deficit present.     Mental Status: He  is alert and oriented to person, place, and time. Mental status is at baseline.  Psychiatric:        Mood and Affect: Mood normal.     ED Results / Procedures / Treatments   Labs (all labs ordered are listed, but only abnormal results are displayed) Labs Reviewed - No data to display  EKG None  Radiology No results found.  Procedures Procedures    Medications Ordered in ED Medications  risperiDONE (RISPERDAL) tablet 0.5 mg (has no administration in time range)  ibuprofen (ADVIL) tablet 800 mg (has no administration in time range)  bacitracin ointment (has no administration in time range)    ED Course/ Medical Decision Making/ A&P                           Medical Decision Making 36 year old male with history of schizophrenia is  requesting dose of his Risperdal as well as medication for mild headache.  Hypertensive intake, vital signs otherwise normal.  Cardiopulmonary dam is normal, abdominal exam is benign.  PERRL, EOMI, no fluorescein neuro exam.  Skin injury to the left neck as above, significant improved from prior.    Risk OTC drugs. Prescription drug management.   No red flag symptoms of headache, neurologically intact without focal deficit on neuroexam.  At patient's baseline from a psychiatric standpoint, very pleasant.  Will provide bacitracin for patient's chronic wound in his left neck which is currently without evidence of cellulitis, will also provide dose of Risperdal and ibuprofen for patient's headache.  No further workup warranted in the ER at this time.  Clinical concern for emergent underlying condition that would warrant further ED workup or inpatient management is exceedingly low.  [facility]  voiced understanding of his medical evaluation and treatment plan. Each of their questions answered to their expressed satisfaction.  Return precautions were given.  Patient is well-appearing, stable, and was discharged in good condition.  This chart was dictated using voice recognition software, Dragon. Despite the best efforts of this provider to proofread and correct errors, errors may still occur which can change documentation meaning.    Final Clinical Impression(s) / ED Diagnoses Final diagnoses:  Medication refill    Rx / DC Orders ED Discharge Orders     None         Paris Lore, PA-C 10/29/22 0455    Sabas Sous, MD 10/29/22 [phone]

## 2022-10-29 NOTE — ED Triage Notes (Signed)
Request Risperdal and something for a headache.

## 2022-10-30 ENCOUNTER — Emergency Department (HOSPITAL_COMMUNITY)
Admission: EM | Admit: 2022-10-30 | Discharge: 2022-10-30 | Disposition: A | Discharge status: Home / Self Care | Payer: Self-pay | Attending: Student | Admitting: Student

## 2022-10-30 ENCOUNTER — Encounter (HOSPITAL_COMMUNITY): Payer: Self-pay

## 2022-10-30 ENCOUNTER — Other Ambulatory Visit: Payer: Self-pay

## 2022-10-30 DIAGNOSIS — F17210 Nicotine dependence, cigarettes, uncomplicated: Secondary | ICD-10-CM | POA: Insufficient documentation

## 2022-10-30 DIAGNOSIS — Z8616 Personal history of COVID-19: Secondary | ICD-10-CM | POA: Insufficient documentation

## 2022-10-30 DIAGNOSIS — R519 Headache, unspecified: Secondary | ICD-10-CM | POA: Insufficient documentation

## 2022-10-30 DIAGNOSIS — Z5900 Homelessness unspecified: Secondary | ICD-10-CM | POA: Insufficient documentation

## 2022-10-30 DIAGNOSIS — I10 Essential (primary) hypertension: Secondary | ICD-10-CM | POA: Insufficient documentation

## 2022-10-30 MED ORDER — NAPROXEN 375 MG PO TABS: 375.0000 mg | ORAL_TABLET | Freq: Two times a day (BID) | ORAL | 0 refills | Status: DC | PRN

## 2022-10-30 MED ORDER — NAPROXEN 250 MG PO TABS
500.0000 mg | ORAL_TABLET | Freq: Once | ORAL | Status: AC
  Administered 2022-10-30: 500 mg via ORAL
  Filled 2022-10-30: qty 2

## 2022-10-30 NOTE — ED Provider Notes (Signed)
[name] EMERGENCY DEPARTMENT Provider Note  CSN: [id number] Arrival date & time: 10/30/22 6010  Chief Complaint(s) Headache and Homeless  HPI Cole Barrett is a 36 y.o. male with PMH paranoid schizophrenia, homelessness, HTN, HLD, alcohol abuse who presents to the emergency department for evaluation of persistent headache.  Patient was seen yesterday in the emergency department for similar symptoms and a medication refill on it risperidone.  He states that he received his medication yesterday and does not need any refills of his Resperate on but does not have any medication for headache.  Denies chest pain, shortness of breath, abdominal pain, nausea, vomiting, numbness, tingling, weakness, head trauma or other systemic symptoms.   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 Authorizing Provider  naproxen (NAPROSYN) 375 MG tablet Take 1 tablet (375 mg total) by mouth 2 (two) times daily as needed for headache. 10/30/22   Yes Nesha Counihan, MD  bacitracin ointment Apply 1 Application topically 2 (two) times daily. 10/13/22   Smoot, Shawn Route, PA-C  mupirocin cream (BACTROBAN) 2 % Apply 1 Application topically 2 (two) times daily. Patient not taking: Reported on 10/01/2022 06/22/22   Marita Kansas, PA-C  risperiDONE (RISPERDAL) 1 MG tablet Take 1 tablet (1 mg total) by mouth at bedtime. Patient taking differently: Take 1 mg by mouth 2 (two) times daily. 01/18/22   Prosperi, Christian H, PA-C  risperiDONE (RISPERDAL) 2 MG tablet Take 1 tablet (2 mg total) by mouth 2 (two) times daily. 10/02/22   Oneta Rack, NP  amantadine (SYMMETREL) 100 MG capsule Take 1 capsule (100 mg total) by mouth 2 (two) times daily. Patient not taking: Reported on 11/04/2019 09/01/19 11/17/19  [name], NP                                                                                                                                    Past Surgical History History reviewed. No pertinent surgical history. Family History  Family History  Problem Relation Age of Onset   Hypertension Mother     Social History Social History   Tobacco Use   Smoking status: Every Day    Packs/day: 0.50    Types: Cigarettes   Smokeless tobacco: Never  Vaping Use   Vaping Use: Never used  Substance Use Topics   Alcohol use: Yes    Comment: BAC not available   Drug use: Yes    Types: Marijuana    Comment: Last used: 2 months ago UDS NA   Allergies Patient has no known allergies.  Review of Systems Review of Systems  Neurological:  Positive for headaches.    Physical Exam Vital Signs  I have reviewed the triage vital signs BP 120/79 (BP Location: Right Arm)   Pulse 99   Temp 97.6 F (36.4 C) (Oral)   Resp 15   Ht 5\' 10"  (1.778 m)   Wt 113.4 kg   SpO2 96%   BMI 35.87 kg/m   Physical Exam Constitutional:      General: He is not in acute distress.    Appearance: Normal appearance.  HENT:     Head: Normocephalic and atraumatic.      Nose: No congestion or rhinorrhea.  Eyes:     General:        Right eye: No discharge.        Left eye: No discharge.     Extraocular Movements: Extraocular movements intact.     Pupils: Pupils are equal, round, and reactive to light.  Cardiovascular:     Rate and Rhythm: Normal rate and regular rhythm.     Heart sounds: No murmur heard. Pulmonary:     Effort: No respiratory distress.     Breath sounds: No wheezing or rales.  Abdominal:     General: There is no distension.     Tenderness: There is no abdominal tenderness.  Musculoskeletal:        General: Normal range of motion.     Cervical back: Normal range of motion.  Skin:    General: Skin is warm and dry.  Neurological:     General: No focal deficit present.     Mental Status: He is alert.     Cranial Nerves: No cranial nerve deficit or dysarthria.     Sensory: No sensory deficit.     Motor: No weakness.     ED Results and Treatments Labs (all labs ordered are listed, but only abnormal results are displayed) Labs Reviewed - No data to display                                                                                                                        Radiology No results found.  Pertinent labs & imaging results that were available during my care of the patient were reviewed by me and considered in my medical decision making (see MDM for details).  Medications Ordered in ED Medications  naproxen (NAPROSYN) tablet 500  mg (500 mg Oral Given 10/30/22 1255)                                                                                                                                     Procedures Procedures  (including critical care time)  Medical Decision Making / ED Course   This patient presents to the ED for concern of headache, this involves an extensive number of treatment options, and is a complaint that carries with it a high risk of complications and morbidity.  The differential diagnosis  includes tension headache, dehydration, medication side effect, viral URI, intracranial bleed  MDM: Patient seen emergency room for evaluation of headache.  Physical exam is unremarkable outside of a chronic wound on the neck of which she was already seen for and has topical antibiotics for.  Neurologic exam unremarkable.  Patient given Naprosyn with improvement of symptoms.  He has no numbness, tingling, weakness or any neurologic complaints and thus CT head was deferred.  Patient then discharged with outpatient follow-up   Additional history obtained:  -External records from outside source obtained and reviewed including: Chart review including previous notes, labs, imaging, consultation notes    Medicines ordered and prescription drug management: Meds ordered this encounter  Medications   naproxen (NAPROSYN) tablet 500 mg   naproxen (NAPROSYN) 375 MG tablet    Sig: Take 1 tablet (375 mg total) by mouth 2 (two) times daily as needed for headache.    Dispense:  20 tablet    Refill:  0    -I have reviewed the patients home medicines and have made adjustments as needed  Critical interventions none    Cardiac Monitoring: The patient was maintained on a cardiac monitor.  I personally viewed and interpreted the cardiac monitored which showed an underlying rhythm of: NSR  Social Determinants of Health:  Factors impacting patients care include: homelessness   Reevaluation: After the interventions noted above, I reevaluated the patient and found that they have :improved  Co morbidities that complicate the patient evaluation  Past Medical History:  Diagnosis Date   COVID-19    Homelessness    Hypercholesterolemia    Mental disorder    Paranoid behavior (HCC)    PE (pulmonary embolism)    Schizophrenia (HCC)       Dispostion: I considered admission for this patient, but he does not meet inpatient criteria for admission he is safe for discharge to outpatient  follow-up     Final Clinical Impression(s) / ED Diagnoses Final diagnoses:  Nonintractable headache, unspecified chronicity pattern, unspecified headache type     @PCDICTATION @    [place] Spainhower, , MD 10/30/22 1319

## 2022-10-30 NOTE — ED Triage Notes (Signed)
Reuqesting pain meds for his headache that has been going on for 1 month

## 2022-11-07 ENCOUNTER — Emergency Department (HOSPITAL_COMMUNITY)
Admission: EM | Admit: 2022-11-07 | Discharge: 2022-11-07 | Disposition: A | Discharge status: Home / Self Care | Payer: Self-pay | Attending: Emergency Medicine | Admitting: Emergency Medicine

## 2022-11-07 ENCOUNTER — Other Ambulatory Visit: Payer: Self-pay

## 2022-11-07 DIAGNOSIS — Z79899 Other long term (current) drug therapy: Secondary | ICD-10-CM

## 2022-11-07 DIAGNOSIS — Z760 Encounter for issue of repeat prescription: Secondary | ICD-10-CM | POA: Insufficient documentation

## 2022-11-07 MED ORDER — RISPERIDONE 1 MG PO TABS
1.0000 mg | ORAL_TABLET | Freq: Once | ORAL | Status: AC
  Administered 2022-11-07: 1 mg via ORAL
  Filled 2022-11-07: qty 1

## 2022-11-07 NOTE — ED Provider Notes (Signed)
[facility] EMERGENCY DEPARTMENT Provider Note   CSN: [id number] Arrival date & time: 11/07/22  6144     History  Chief Complaint  Patient presents with   Medication Refill    Cole Barrett is a 36 y.o. male.  The history is provided by the patient and medical records.  Medication Refill  36 y.o. M presenting to the ED requesting nighttime dose of his Risperdal.  He has been having issues with rash on the side of his neck, however today states "it straight" and does not want this evaluated.  Denies any SI/HI.  Denies any pain.  Home Medications Prior to Admission medications   Medication Sig Start Date End Date Taking? Authorizing Provider  bacitracin ointment Apply 1 Application topically 2 (two) times daily. 10/13/22   Smoot, Shawn Route, PA-C  mupirocin cream (BACTROBAN) 2 % Apply 1 Application topically 2 (two) times daily. Patient not taking: Reported on 10/01/2022 06/22/22   Marita Kansas, PA-C  naproxen (NAPROSYN) 375 MG tablet Take 1 tablet (375 mg total) by mouth 2 (two) times daily as needed for headache. 10/30/22   Kommor, [place], MD  risperiDONE (RISPERDAL) 1 MG tablet Take 1 tablet (1 mg total) by mouth at bedtime. Patient taking differently: Take 1 mg by mouth 2 (two) times daily. 01/18/22   Prosperi, Christian H, PA-C  risperiDONE (RISPERDAL) 2 MG tablet Take 1 tablet (2 mg total) by mouth 2 (two) times daily. 10/02/22   Oneta Rack, NP  amantadine (SYMMETREL) 100 MG capsule Take 1 capsule (100 mg total) by mouth 2 (two) times daily. Patient not taking: Reported on 11/04/2019 09/01/19 11/17/19  [name], NP      Allergies    Patient has no known allergies.    Review of Systems   Review of Systems  Psychiatric/Behavioral:         Medication needs  All other systems reviewed and are negative.   Physical Exam Updated Vital Signs BP (!) 141/75 (BP Location: Right Arm)   Pulse 85   Temp 98.3 F (36.8 C)   Resp 16   Ht 5\' 10"   (1.778 m)   Wt 114 kg   SpO2 99%   BMI 36.06 kg/m  Physical Exam Vitals and nursing note reviewed.  Constitutional:      Appearance: He is well-developed.  HENT:     Head: Normocephalic and atraumatic.  Eyes:     Conjunctiva/sclera: Conjunctivae normal.     Pupils: Pupils are equal, round, and reactive to light.  Neck:     Comments: Barrett blanket wrapped around his neck like a scarf, refused to have rash assessed Cardiovascular:     Rate and Rhythm: Normal rate and regular rhythm.     Heart sounds: Normal heart sounds.  Pulmonary:     Effort: Pulmonary effort is normal. No respiratory distress.     Breath sounds: Normal breath sounds. No rhonchi.  Musculoskeletal:        General: Normal range of motion.     Cervical back: Normal range of motion.  Skin:    General: Skin is warm and dry.  Neurological:     Mental Status: He is alert and oriented to person, place, and time.     Comments: Denies SI/HI     ED Results / Procedures / Treatments   Labs (all labs ordered are listed, but only abnormal results are displayed) Labs Reviewed - No data to display  EKG None  Radiology No  results found.  Procedures Procedures    Medications Ordered in ED Medications  risperiDONE (RISPERDAL) tablet 1 mg (has no administration in time range)    ED Course/ Medical Decision Making/ A&P                           Medical Decision Making Risk Prescription drug management.   36 year old male requesting dose of his Risperdal.  Denies any SI/HI.  Also has been having issues with rash to left side of his neck, however refused evaluation of this today.  He has a blanket wrapped around his neck like a scarf to prevent visualization.  Given dose of his Risperdal, appears stable for discharge.  Final Clinical Impression(s) / ED Diagnoses Final diagnoses:  Medication management    Rx / DC Orders ED Discharge Orders     None         Garlon Hatchet, PA-C 11/07/22  0552    Sabas Sous, MD 11/07/22 [phone]

## 2022-11-07 NOTE — ED Triage Notes (Signed)
Pt arrived to triage requesting his Risperdal. Pt denies pain or further needs at this time

## 2022-11-09 ENCOUNTER — Emergency Department (HOSPITAL_COMMUNITY)
Admission: EM | Admit: 2022-11-09 | Discharge: 2022-11-09 | Disposition: A | Discharge status: Home / Self Care | Payer: Self-pay | Attending: Emergency Medicine | Admitting: Emergency Medicine

## 2022-11-09 ENCOUNTER — Encounter (HOSPITAL_COMMUNITY): Payer: Self-pay

## 2022-11-09 DIAGNOSIS — R519 Headache, unspecified: Secondary | ICD-10-CM | POA: Insufficient documentation

## 2022-11-09 MED ORDER — ACETAMINOPHEN 500 MG PO TABS
1000.0000 mg | ORAL_TABLET | Freq: Once | ORAL | Status: AC
  Administered 2022-11-09: 1000 mg via ORAL
  Filled 2022-11-09: qty 2

## 2022-11-09 NOTE — ED Triage Notes (Signed)
Pt requesting pain medication for his neck

## 2022-11-09 NOTE — Discharge Instructions (Signed)
Tylenol or motrin as needed. Make sure to take your risperdal this morning.

## 2022-11-09 NOTE — ED Provider Notes (Signed)
[facility] EMERGENCY DEPARTMENT Provider Note   CSN: [id number] Arrival date & time: 11/09/22  7619     History  Chief Complaint  Patient presents with   Medication Refill    Cole Barrett is a 36 y.o. male.  The history is provided by the patient and medical records.  Medication Refill  36 y.o. M here requesting dose of pain meds.  States he has a headache.  Denies fever, chills, neck pain/stiffness.  States rash on his neck is ok, does not want this examined today.  Plans to take his risperdal later this morning before work, does not want it now.  Well known to this department, 55 visits in the past 6 months.  Home Medications Prior to Admission medications   Medication Sig Start Date End Date Taking? Authorizing Provider  bacitracin ointment Apply 1 Application topically 2 (two) times daily. 10/13/22   Smoot, Shawn Route, PA-C  mupirocin cream (BACTROBAN) 2 % Apply 1 Application topically 2 (two) times daily. Patient not taking: Reported on 10/01/2022 06/22/22   Marita Kansas, PA-C  naproxen (NAPROSYN) 375 MG tablet Take 1 tablet (375 mg total) by mouth 2 (two) times daily as needed for headache. 10/30/22   Kommor, Madison, MD  risperiDONE (RISPERDAL) 1 MG tablet Take 1 tablet (1 mg total) by mouth at bedtime. Patient taking differently: Take 1 mg by mouth 2 (two) times daily. 01/18/22   Prosperi, Christian H, PA-C  risperiDONE (RISPERDAL) 2 MG tablet Take 1 tablet (2 mg total) by mouth 2 (two) times daily. 10/02/22   Oneta Rack, NP  amantadine (SYMMETREL) 100 MG capsule Take 1 capsule (100 mg total) by mouth 2 (two) times daily. Patient not taking: Reported on 11/04/2019 09/01/19 11/17/19  [name], NP      Allergies    Patient has no known allergies.    Review of Systems   Review of Systems  Neurological:  Positive for headaches.  All other systems reviewed and are negative.   Physical Exam Updated Vital Signs BP 131/73 (BP Location:  Right Arm)   Pulse 86   Temp 97.6 F (36.4 C) (Oral)   Resp 17   SpO2 95%   Physical Exam Vitals and nursing note reviewed.  Constitutional:      Appearance: He is well-developed.  HENT:     Head: Normocephalic and atraumatic.  Eyes:     Conjunctiva/sclera: Conjunctivae normal.     Pupils: Pupils are equal, round, and reactive to light.  Neck:     Comments: Hospital blanket wrapped around neck like a scarf, will not remove for examination Cardiovascular:     Rate and Rhythm: Normal rate and regular rhythm.     Heart sounds: Normal heart sounds.  Pulmonary:     Effort: Pulmonary effort is normal.     Breath sounds: Normal breath sounds.  Abdominal:     General: Bowel sounds are normal.     Palpations: Abdomen is soft.  Musculoskeletal:        General: Normal range of motion.     Cervical back: Normal range of motion.  Skin:    General: Skin is warm and dry.  Neurological:     Mental Status: He is alert and oriented to person, place, and time.     ED Results / Procedures / Treatments   Labs (all labs ordered are listed, but only abnormal results are displayed) Labs Reviewed - No data to display  EKG None  Radiology No results found.  Procedures Procedures    Medications Ordered in ED Medications  acetaminophen (TYLENOL) tablet 1,000 mg (has no administration in time range)    ED Course/ Medical Decision Making/ A&P                           Medical Decision Making Risk OTC drugs.   36 y.o. M here requesting dose of medicine for headache.  Afebrile, non-toxic, stable VS.  No focal deficits.  No meningeal signs.  He appears at baseline.  Tylenol given.  He has been having issues with rash to his neck.  Has hospital blanket wrapped around his neck preventing visualization.  I inquired about this today, again refuses to have rash evaluated today as he did 2 days ago at prior visit.  Stable for discharge.    Final Clinical Impression(s) / ED  Diagnoses Final diagnoses:  Acute nonintractable headache, unspecified headache type    Rx / DC Orders ED Discharge Orders     None         Garlon Hatchet, PA-C 11/09/22 6759    Gilda Crease, MD 11/10/22 [id number]

## 2022-11-11 ENCOUNTER — Emergency Department (HOSPITAL_COMMUNITY)
Admission: EM | Admit: 2022-11-11 | Discharge: 2022-11-12 | Discharge status: Against Medical Advice | Payer: Self-pay | Attending: Emergency Medicine | Admitting: Emergency Medicine

## 2022-11-11 ENCOUNTER — Other Ambulatory Visit: Payer: Self-pay

## 2022-11-11 DIAGNOSIS — Z760 Encounter for issue of repeat prescription: Secondary | ICD-10-CM | POA: Insufficient documentation

## 2022-11-11 DIAGNOSIS — R519 Headache, unspecified: Secondary | ICD-10-CM | POA: Insufficient documentation

## 2022-11-11 DIAGNOSIS — Z5321 Procedure and treatment not carried out due to patient leaving prior to being seen by health care provider: Secondary | ICD-10-CM | POA: Insufficient documentation

## 2022-11-11 DIAGNOSIS — R440 Auditory hallucinations: Secondary | ICD-10-CM | POA: Insufficient documentation

## 2022-11-11 NOTE — ED Triage Notes (Signed)
Pt presents for HA (general) x 2days. No fever, chills, vision changes, balance trouble, difficulty walking, speech changes.   Also needs a refill of his risperidone. States he takes this for auditory hallucination, took last dose today.  No hallucinations currently, no SI/HI

## 2022-11-12 ENCOUNTER — Emergency Department (HOSPITAL_COMMUNITY)
Admission: EM | Admit: 2022-11-12 | Discharge: 2022-11-12 | Disposition: A | Discharge status: Home / Self Care | Payer: Self-pay | Attending: Emergency Medicine | Admitting: Emergency Medicine

## 2022-11-12 ENCOUNTER — Other Ambulatory Visit: Payer: Self-pay

## 2022-11-12 DIAGNOSIS — R519 Headache, unspecified: Secondary | ICD-10-CM | POA: Insufficient documentation

## 2022-11-12 DIAGNOSIS — Z760 Encounter for issue of repeat prescription: Secondary | ICD-10-CM

## 2022-11-12 DIAGNOSIS — Z5900 Homelessness unspecified: Secondary | ICD-10-CM | POA: Insufficient documentation

## 2022-11-12 MED ORDER — ACETAMINOPHEN 325 MG PO TABS
650.0000 mg | ORAL_TABLET | Freq: Once | ORAL | Status: AC
  Administered 2022-11-12: 650 mg via ORAL
  Filled 2022-11-12: qty 2

## 2022-11-12 MED ORDER — RISPERIDONE 0.5 MG PO TABS
2.0000 mg | ORAL_TABLET | Freq: Once | ORAL | Status: AC
  Administered 2022-11-12: 2 mg via ORAL
  Filled 2022-11-12: qty 4

## 2022-11-12 MED ORDER — RISPERIDONE 1 MG PO TABS
2.0000 mg | ORAL_TABLET | Freq: Once | ORAL | Status: AC
  Administered 2022-11-12: 2 mg via ORAL
  Filled 2022-11-12: qty 2

## 2022-11-12 NOTE — ED Provider Notes (Signed)
[facility]-EMERGENCY DEPT Provider Note   CSN: [id number] Arrival date & time: 11/12/22  6433     History Chief Complaint  Patient presents with   Medication Refill    Cole Barrett is a 36 y.o. male well-known to the emergency department presents the emergency room today for his Risperdal.  He also is reporting about off-and-on headache base was seen for previously and request some pain medication for this.  Denies any nausea, vomiting, blurry vision.  He denies any neck pain reports that his neck wound has been healing well.  Will not allow me to touch it however.   Medication Refill      Home Medications Prior to Admission medications   Medication Sig Start Date End Date Taking? Authorizing Provider  bacitracin ointment Apply 1 Application topically 2 (two) times daily. 10/13/22   Smoot, Shawn Route, PA-C  mupirocin cream (BACTROBAN) 2 % Apply 1 Application topically 2 (two) times daily. Patient not taking: Reported on 10/01/2022 06/22/22   Marita Kansas, PA-C  naproxen (NAPROSYN) 375 MG tablet Take 1 tablet (375 mg total) by mouth 2 (two) times daily as needed for headache. 10/30/22   Kommor, Madison, MD  risperiDONE (RISPERDAL) 1 MG tablet Take 1 tablet (1 mg total) by mouth at bedtime. Patient taking differently: Take 1 mg by mouth 2 (two) times daily. 01/18/22   Prosperi, Christian H, PA-C  risperiDONE (RISPERDAL) 2 MG tablet Take 1 tablet (2 mg total) by mouth 2 (two) times daily. 10/02/22   Oneta Rack, NP  amantadine (SYMMETREL) 100 MG capsule Take 1 capsule (100 mg total) by mouth 2 (two) times daily. Patient not taking: Reported on 11/04/2019 09/01/19 11/17/19  [name], NP      Allergies    Patient has no known allergies.    Review of Systems   Review of Systems  Constitutional:  Negative for chills and fever.  Eyes:  Negative for visual disturbance.  Respiratory:  Negative for shortness of breath.   Cardiovascular:  Negative for  chest pain.  Gastrointestinal:  Negative for nausea and vomiting.  Musculoskeletal:  Negative for neck pain.  Neurological:  Positive for headaches.    Physical Exam Updated Vital Signs BP (!) 136/92 (BP Location: Right Arm)   Pulse 82   Temp 97.6 F (36.4 C) (Oral)   Resp 18   Ht 5\' 10"  (1.778 m)   Wt 99.8 kg   SpO2 100%   BMI 31.57 kg/m  Physical Exam Vitals and nursing note reviewed.  Constitutional:      General: He is not in acute distress.    Appearance: Normal appearance. He is not ill-appearing or toxic-appearing.  Eyes:     General: No scleral icterus. Neck:     Comments: Neck wound to the left side of the neck appears to be well-healing.  Patient denies any access to touching this.  I also have a very limited view Pulmonary:     Effort: Pulmonary effort is normal. No respiratory distress.  Musculoskeletal:     Cervical back: Normal range of motion.  Skin:    General: Skin is dry.     Findings: No rash.  Neurological:     General: No focal deficit present.     Mental Status: He is alert. Mental status is at baseline.     Gait: Gait normal.  Psychiatric:        Mood and Affect: Mood normal.     ED Results /  Procedures / Treatments   Labs (all labs ordered are listed, but only abnormal results are displayed) Labs Reviewed - No data to display  EKG None  Radiology No results found.  Procedures Procedures   Medications Ordered in ED Medications  risperiDONE (RISPERDAL) tablet 2 mg (has no administration in time range)  acetaminophen (TYLENOL) tablet 650 mg (has no administration in time range)    ED Course/ Medical Decision Making/ A&P                           Medical Decision Making   36 year old male presents emergency room today for dose of his Risperdal.  Patient is well-known to the emergency [facility] and has been to other ERs frequently for the same presentation.  He reports he had an off-and-on headache without any nausea, vomiting, or  vision changes.  He reports that his neck wound is healing well.  From seeing him previously, the wound bed appears pink with no active discharge noted from the very limited view I was given.  He does not allow me to touch the area.  He does have full range of motion of his neck no meningeal signs.  He is well-appearing.  Blood pressure slightly elevated otherwise normal.  Will order his daily or scrotal dose and some Tylenol for pain.  Recommend follow-up with his primary care doctor.  We discussed return precautions without symptoms.  Patient verbalized understanding agrees the plan.  Patient stable to be discharged home in good condition.  Final Clinical Impression(s) / ED Diagnoses Final diagnoses:  Medication refill  Acute nonintractable headache, unspecified headache type    Rx / DC Orders ED Discharge Orders     None         Sherrell Puller, PA-C 11/12/22 1028    Malvin Johns, MD 11/12/22 1329

## 2022-11-12 NOTE — ED Provider Notes (Signed)
[facility] EMERGENCY DEPARTMENT Provider Note   CSN: [id number] Arrival date & time: 11/12/22  1902     History  Chief Complaint  Patient presents with   Headache    Cole Barrett is a 36 y.o. male who is well-known to this department.  Patient with history of brief psychotic disorder, homelessness, schizophrenia, remote previous PE.  He presents with same complaint as last night, reports that he does not want any lab work, imaging, or other workup at this time discharged questing his normal risperidone and Tylenol.  He has a known neck wound which she reports is improving, does not wish for me to examine at this time.  He still has prescription for risperidone which he has not filled.  HPI     Home Medications Prior to Admission medications   Medication Sig Start Date End Date Taking? Authorizing Provider  bacitracin ointment Apply 1 Application topically 2 (two) times daily. 10/13/22   Smoot, Leary Roca, PA-C  mupirocin cream (BACTROBAN) 2 % Apply 1 Application topically 2 (two) times daily. Patient not taking: Reported on 10/01/2022 06/22/22   Evlyn Courier, PA-C  naproxen (NAPROSYN) 375 MG tablet Take 1 tablet (375 mg total) by mouth 2 (two) times daily as needed for headache. 10/30/22   Kommor, Madison, MD  risperiDONE (RISPERDAL) 1 MG tablet Take 1 tablet (1 mg total) by mouth at bedtime. Patient taking differently: Take 1 mg by mouth 2 (two) times daily. 01/18/22   Jahmani Staup H, PA-C  risperiDONE (RISPERDAL) 2 MG tablet Take 1 tablet (2 mg total) by mouth 2 (two) times daily. 10/02/22   Derrill Center, NP  amantadine (SYMMETREL) 100 MG capsule Take 1 capsule (100 mg total) by mouth 2 (two) times daily. Patient not taking: Reported on 11/04/2019 09/01/19 11/17/19  Mallie Darting, NP      Allergies    Patient has no known allergies.    Review of Systems   Review of Systems  All other systems reviewed and are negative.   Physical  Exam Updated Vital Signs BP 115/66 (BP Location: Right Arm)   Pulse 94   Temp 97.6 F (36.4 C) (Oral)   Resp 16   SpO2 98%  Physical Exam Vitals and nursing note reviewed.  Constitutional:      General: He is not in acute distress.    Appearance: Normal appearance.  HENT:     Head: Normocephalic and atraumatic.  Eyes:     General:        Right eye: No discharge.        Left eye: No discharge.  Cardiovascular:     Rate and Rhythm: Normal rate and regular rhythm.  Pulmonary:     Effort: Pulmonary effort is normal. No respiratory distress.  Musculoskeletal:        General: No deformity.  Skin:    General: Skin is warm and dry.     Comments: Evidence of appropriately healing neck wound noted with somewhat limited exam  Neurological:     Mental Status: He is alert and oriented to person, place, and time.     Comments: CN II through XII grossly intact, patient moves all 4 limbs spontaneously, will not submit to remainder of  Psychiatric:        Mood and Affect: Mood normal.        Behavior: Behavior normal.     ED Results / Procedures / Treatments   Labs (all labs ordered are listed,  but only abnormal results are displayed) Labs Reviewed - No data to display  EKG None  Radiology No results found.  Procedures Procedures    Medications Ordered in ED Medications  risperiDONE (RISPERDAL) tablet 2 mg (has no administration in time range)  acetaminophen (TYLENOL) tablet 650 mg (has no administration in time range)    ED Course/ Medical Decision Making/ A&P                           Medical Decision Making This is an overall well-appearing 36 year old male who is well-known to this emergency department with greater than 6 visits this month alone presents with need for medication refill for his risperidone, and headache.  He is declining any further workup.  Patient appears stable with no focal neurologic symptoms.  He is declining any additional workup today.  Limited  assessment of his neck wound reveals appropriately healing wound.  Think is reasonable at this time for patient to receive his 2 mg risperidone at the house milligrams Tylenol and be discharged as do not think that he requires any further workup at this time.  Patient understands and agrees to plan, and is discharged in stable condition at this time. Final Clinical Impression(s) / ED Diagnoses Final diagnoses:  Acute nonintractable headache, unspecified headache type  Medication refill    Rx / DC Orders ED Discharge Orders     None         [place] 11/12/22 2044    Terald Sleeper, MD 11/12/22 2103

## 2022-11-12 NOTE — ED Triage Notes (Signed)
Patient reports headache today , denies head injury , no emesis or photophobia.

## 2022-11-12 NOTE — Discharge Instructions (Signed)
Please follow up with your PCP for medication refills. If you have any concerns, new, or worsening symptoms, please return to the nearest ER for evaluation.

## 2022-11-12 NOTE — ED Triage Notes (Signed)
Patient coming to ED requesting medication administration.  Requesting Risperidone and "pain medication" for headache.  Recently seen at [facility] for same.  No changes in HA from previous

## 2022-11-15 ENCOUNTER — Emergency Department (HOSPITAL_COMMUNITY)
Admission: EM | Admit: 2022-11-15 | Discharge: 2022-11-15 | Disposition: A | Discharge status: Home / Self Care | Payer: Self-pay | Attending: Emergency Medicine | Admitting: Emergency Medicine

## 2022-11-15 ENCOUNTER — Other Ambulatory Visit: Payer: Self-pay

## 2022-11-15 DIAGNOSIS — T148XXA Other injury of unspecified body region, initial encounter: Secondary | ICD-10-CM

## 2022-11-15 DIAGNOSIS — X58XXXA Exposure to other specified factors, initial encounter: Secondary | ICD-10-CM | POA: Insufficient documentation

## 2022-11-15 DIAGNOSIS — S50811A Abrasion of right forearm, initial encounter: Secondary | ICD-10-CM | POA: Insufficient documentation

## 2022-11-15 DIAGNOSIS — R519 Headache, unspecified: Secondary | ICD-10-CM | POA: Insufficient documentation

## 2022-11-15 MED ORDER — BACITRACIN ZINC 500 UNIT/GM EX OINT
TOPICAL_OINTMENT | Freq: Two times a day (BID) | CUTANEOUS | Status: DC
  Administered 2022-11-15: 1 via TOPICAL
  Filled 2022-11-15: qty 0.9

## 2022-11-15 MED ORDER — IBUPROFEN 400 MG PO TABS
600.0000 mg | ORAL_TABLET | Freq: Once | ORAL | Status: AC
  Administered 2022-11-15: 600 mg via ORAL
  Filled 2022-11-15: qty 1

## 2022-11-15 NOTE — ED Provider Notes (Signed)
[name] EMERGENCY DEPARTMENT Provider Note   CSN: [id number] Arrival date & time: 11/15/22  3299     History  Chief Complaint  Patient presents with   Headache   HPI Cole Barrett is a 36 y.o. male with homelessness, schizophrenia and remote PE, admits use disorder and conduct disorder presenting for right arm abrasion and headache.  Noticed the right arm abrasion this morning.  Located in his right upper arm about his mid tricep.  States wound is not bleeding or oozing.  Patient did mention that he often picks at his skin.  Patient also mentioned that he had a mild headache which also started this morning.  It is not the worst headache of his life.  Denies visual disturbance, fever and AMS.  States he forgot to take his risperidone this morning but has a supply of his medication at his place of residence.   Headache      Home Medications Prior to Admission medications   Medication Sig Start Date End Date Taking? Authorizing Provider  bacitracin ointment Apply 1 Application topically 2 (two) times daily. 10/13/22   Smoot, Shawn Route, PA-C  mupirocin cream (BACTROBAN) 2 % Apply 1 Application topically 2 (two) times daily. Patient not taking: Reported on 10/01/2022 06/22/22   Marita Kansas, PA-C  naproxen (NAPROSYN) 375 MG tablet Take 1 tablet (375 mg total) by mouth 2 (two) times daily as needed for headache. 10/30/22   Kommor, Madison, MD  risperiDONE (RISPERDAL) 1 MG tablet Take 1 tablet (1 mg total) by mouth at bedtime. Patient taking differently: Take 1 mg by mouth 2 (two) times daily. 01/18/22   Prosperi, Christian H, PA-C  risperiDONE (RISPERDAL) 2 MG tablet Take 1 tablet (2 mg total) by mouth 2 (two) times daily. 10/02/22   Oneta Rack, NP  amantadine (SYMMETREL) 100 MG capsule Take 1 capsule (100 mg total) by mouth 2 (two) times daily. Patient not taking: Reported on 11/04/2019 09/01/19 11/17/19  [name], NP      Allergies    Patient has  no known allergies.    Review of Systems   Review of Systems  Neurological:  Positive for headaches.    Physical Exam Updated Vital Signs BP 117/80 (BP Location: Right Arm)   Pulse 87   Temp 98.8 F (37.1 C) (Oral)   Resp 20   SpO2 99%  Physical Exam Vitals and nursing note reviewed.  HENT:     Head: Normocephalic and atraumatic.     Mouth/Throat:     Mouth: Mucous membranes are moist.  Eyes:     General:        Right eye: No discharge.        Left eye: No discharge.     Conjunctiva/sclera: Conjunctivae normal.  Neck:   Cardiovascular:     Rate and Rhythm: Normal rate and regular rhythm.     Pulses: Normal pulses.     Heart sounds: Normal heart sounds.  Pulmonary:     Effort: Pulmonary effort is normal.     Breath sounds: Normal breath sounds.  Abdominal:     General: Abdomen is flat.     Palpations: Abdomen is soft.  Musculoskeletal:       Arms:  Skin:    General: Skin is warm and dry.  Neurological:     General: No focal deficit present.     Comments: GCS 15. Speech is goal oriented. No deficits appreciated to CN III-XII; symmetric eyebrow raise,  no facial drooping, tongue midline. Patient has equal grip strength bilaterally with 5/5 strength against resistance in all major muscle groups bilaterally. Sensation to light touch intact. Patient moves extremities without ataxia. Normal finger-nose-finger. Patient ambulatory with steady gait.  Psychiatric:        Mood and Affect: Mood normal.     ED Results / Procedures / Treatments   Labs (all labs ordered are listed, but only abnormal results are displayed) Labs Reviewed - No data to display  EKG None  Radiology No results found.  Procedures Procedures    Medications Ordered in ED Medications  ibuprofen (ADVIL) tablet 600 mg (has no administration in time range)  bacitracin ointment (has no administration in time range)    ED Course/ Medical Decision Making/ A&P                            Medical Decision Making  36 year old male who is well-appearing well-known to the emergency department.  Physical exam notable for right arm abrasion.  Likely caused from picking at the skin.  Has various old similar abrasions that appear well-healed.  Also new abrasion about his neck without oozing or bleeding.  Treated abrasions with bacitracin and dressing.  Symptoms associated with headache and reassuring neuroexam are not consistent with sinister intracranial pathology.  Treated headache with ibuprofen. Advised patient to take his risperidone when he returned to his place of residence after this encounter.  Advised to follow-up with his PCP.        Final Clinical Impression(s) / ED Diagnoses Final diagnoses:  Nonintractable headache, unspecified chronicity pattern, unspecified headache type  Skin abrasion    Rx / DC Orders ED Discharge Orders     None         Gareth Eagle, PA-C 11/15/22 1011    Gerhard Munch, MD 11/15/22 1102

## 2022-11-15 NOTE — ED Triage Notes (Signed)
Patient requesting antibiotic ointment for his right arm abrasion and also reported headache this morning .

## 2022-11-15 NOTE — ED Notes (Signed)
Patient is sleeping at this time.

## 2022-11-15 NOTE — ED Notes (Signed)
Pt stated he doesn't want wounds dressed.

## 2022-11-15 NOTE — Discharge Instructions (Signed)
Treated your skin abrasions with bacitracin which is a topical antibiotic.  Recommend that you continue to apply the medicine for the next 3 days.  Treated your headache with ibuprofen.  Recommend that you follow-up with your PCP for your skin abrasions and ongoing headache.

## 2022-11-16 ENCOUNTER — Emergency Department (HOSPITAL_COMMUNITY)
Admission: EM | Admit: 2022-11-16 | Discharge: 2022-11-16 | Disposition: A | Discharge status: Home / Self Care | Payer: Self-pay | Attending: Emergency Medicine | Admitting: Emergency Medicine

## 2022-11-16 ENCOUNTER — Other Ambulatory Visit: Payer: Self-pay

## 2022-11-16 DIAGNOSIS — Z760 Encounter for issue of repeat prescription: Secondary | ICD-10-CM | POA: Insufficient documentation

## 2022-11-16 DIAGNOSIS — Z7689 Persons encountering health services in other specified circumstances: Secondary | ICD-10-CM

## 2022-11-16 DIAGNOSIS — Z5900 Homelessness unspecified: Secondary | ICD-10-CM | POA: Insufficient documentation

## 2022-11-16 DIAGNOSIS — Z8616 Personal history of COVID-19: Secondary | ICD-10-CM | POA: Insufficient documentation

## 2022-11-16 MED ORDER — RISPERIDONE 3 MG PO TABS
3.0000 mg | ORAL_TABLET | Freq: Once | ORAL | Status: AC
  Administered 2022-11-16: 3 mg via ORAL
  Filled 2022-11-16: qty 1

## 2022-11-16 NOTE — Discharge Instructions (Signed)
Make sure to take your medications with you if you anticipate that you will not be able to make it home.

## 2022-11-16 NOTE — ED Provider Notes (Signed)
[facility] EMERGENCY DEPARTMENT Provider Note   CSN: [id number] Arrival date & time: 11/16/22  [id number]     History  No chief complaint on file.   Cole Barrett is a 36 y.o. male.  HPI     This is a 36 year old male well-known to our department who presents requesting his risperdal.  Patient believes he took his last dose last night.  He normally takes 3 mg in the morning.  When asked if he is out of his medication he states "no but I had to go somewhere before I go home and I need my medicine."  He has no physical complaints at this time.  He has multiple ED visits in the past for the same.   Home Medications Prior to Admission medications   Medication Sig Start Date End Date Taking? Authorizing Provider  bacitracin ointment Apply 1 Application topically 2 (two) times daily. 10/13/22   Smoot, Leary Roca, PA-C  mupirocin cream (BACTROBAN) 2 % Apply 1 Application topically 2 (two) times daily. Patient not taking: Reported on 10/01/2022 06/22/22   Evlyn Courier, PA-C  naproxen (NAPROSYN) 375 MG tablet Take 1 tablet (375 mg total) by mouth 2 (two) times daily as needed for headache. 10/30/22   Kommor, Madison, MD  risperiDONE (RISPERDAL) 1 MG tablet Take 1 tablet (1 mg total) by mouth at bedtime. Patient taking differently: Take 1 mg by mouth 2 (two) times daily. 01/18/22   Prosperi, Christian H, PA-C  risperiDONE (RISPERDAL) 2 MG tablet Take 1 tablet (2 mg total) by mouth 2 (two) times daily. 10/02/22   Derrill Center, NP  amantadine (SYMMETREL) 100 MG capsule Take 1 capsule (100 mg total) by mouth 2 (two) times daily. Patient not taking: Reported on 11/04/2019 09/01/19 11/17/19  Mallie Darting, NP      Allergies    Patient has no known allergies.    Review of Systems   Review of Systems  All other systems reviewed and are negative.   Physical Exam Updated Vital Signs BP 137/87 (BP Location: Right Arm)   Pulse 80   Temp 98 F (36.7 C)   Resp 20   SpO2  97%  Physical Exam Vitals and nursing note reviewed.  Constitutional:      Appearance: He is well-developed.     Comments: Disheveled appearing but nontoxic  HENT:     Head: Normocephalic and atraumatic.  Eyes:     Pupils: Pupils are equal, round, and reactive to light.     Comments: Bilateral conjunctive injected  Cardiovascular:     Rate and Rhythm: Normal rate and regular rhythm.  Pulmonary:     Effort: Pulmonary effort is normal. No respiratory distress.  Abdominal:     Palpations: Abdomen is soft.  Musculoskeletal:     Cervical back: Neck supple.  Lymphadenopathy:     Cervical: No cervical adenopathy.  Skin:    General: Skin is warm and dry.  Neurological:     Mental Status: He is alert and oriented to person, place, and time.  Psychiatric:     Comments: Cooperative     ED Results / Procedures / Treatments   Labs (all labs ordered are listed, but only abnormal results are displayed) Labs Reviewed - No data to display  EKG None  Radiology No results found.  Procedures Procedures    Medications Ordered in ED Medications  risperiDONE (RISPERDAL) tablet 3 mg (has no administration in time range)    ED Course/ Medical  Decision Making/ A&P                           Medical Decision Making Risk Prescription drug management.   This patient presents to the ED for concern of administration of medication, this involves an extensive number of treatment options, and is a complaint that carries with it a high risk of complications and morbidity.  I considered the following differential and admission for this acute, potentially life threatening condition.  The differential diagnosis includes need for medications, psych history  MDM:    Is a 36 year old male well-known to our department who presents requesting his medications.  He has done this multiple times in the past.  He is nontoxic and vital signs are reassuring.  He has no other complaints.  I stressed to him  the importance of making sure that he carry some medications with him so that he does not have to come to the emergency department.  However, given his significant psychiatric history, will give 1 dose of Risperdal.  (Labs, imaging, consults)  Labs: I Ordered, and personally interpreted labs.  The pertinent results include: None  Imaging Studies ordered: I ordered imaging studies including none I independently visualized and interpreted imaging. I agree with the radiologist interpretation  Additional history obtained from chart review.  External records from outside source obtained and reviewed including prior evaluations  Cardiac Monitoring: The patient was maintained on a cardiac monitor.  I personally viewed and interpreted the cardiac monitored which showed an underlying rhythm of: Sinus rhythm  Reevaluation: After the interventions noted above, I reevaluated the patient and found that they have :improved  Social Determinants of Health:  homelessness, housing instability, psychiatric history  Disposition: Discharge  Co morbidities that complicate the patient evaluation  Past Medical History:  Diagnosis Date   COVID-19    Homelessness    Hypercholesterolemia    Mental disorder    Paranoid behavior (HCC)    PE (pulmonary embolism)    Schizophrenia (HCC)      Medicines Meds ordered this encounter  Medications   risperiDONE (RISPERDAL) tablet 3 mg    I have reviewed the patients home medicines and have made adjustments as needed  Problem List / ED Course: Problem List Items Addressed This Visit   None Visit Diagnoses     Encounter for medication administration    -  Primary                   Final Clinical Impression(s) / ED Diagnoses Final diagnoses:  Encounter for medication administration    Rx / DC Orders ED Discharge Orders     None         Shon Baton, MD 11/16/22 [phone]

## 2022-11-16 NOTE — ED Notes (Addendum)
Sitting in chair, sleeping/resting. Pending d/c. Pt seen treated and dispositioned for d/c prior to RN assessment, see EDP notes. Orders received and initiated.

## 2022-11-16 NOTE — ED Triage Notes (Signed)
Pt states that he has arrived today to be given a dose of respirdal. 1mg  in AM and 3mg  in PM.  No other complaints today

## 2022-11-17 ENCOUNTER — Emergency Department (HOSPITAL_COMMUNITY)
Admission: EM | Admit: 2022-11-17 | Discharge: 2022-11-17 | Disposition: A | Discharge status: Home / Self Care | Payer: Self-pay | Attending: Emergency Medicine | Admitting: Emergency Medicine

## 2022-11-17 ENCOUNTER — Emergency Department (HOSPITAL_COMMUNITY): Admission: EM | Admit: 2022-11-17 | Discharge: 2022-11-17 | Discharge status: Against Medical Advice | Payer: Self-pay

## 2022-11-17 ENCOUNTER — Other Ambulatory Visit: Payer: Self-pay

## 2022-11-17 ENCOUNTER — Encounter (HOSPITAL_COMMUNITY): Payer: Self-pay

## 2022-11-17 DIAGNOSIS — Z789 Other specified health status: Secondary | ICD-10-CM

## 2022-11-17 DIAGNOSIS — Z760 Encounter for issue of repeat prescription: Secondary | ICD-10-CM | POA: Insufficient documentation

## 2022-11-17 MED ORDER — RISPERIDONE 1 MG PO TABS
1.0000 mg | ORAL_TABLET | Freq: Once | ORAL | Status: AC
  Administered 2022-11-17: 1 mg via ORAL
  Filled 2022-11-17: qty 1

## 2022-11-17 NOTE — ED Notes (Signed)
Pt called multiple times for triage, no answer. 

## 2022-11-17 NOTE — ED Provider Notes (Signed)
[facility] EMERGENCY DEPARTMENT Provider Note   CSN: [id number] Arrival date & time: 11/17/22  0537     History  Chief Complaint  Patient presents with   Medication Refill    Cole Barrett is a 36 y.o. male.  The history is provided by the patient and medical records.  Medication Refill  36 y.o. M here requesting dose of his risperdal and a cold gingerale.  No other acute complaints.  Denies SI/HI.    Home Medications Prior to Admission medications   Medication Sig Start Date End Date Taking? Authorizing Provider  bacitracin ointment Apply 1 Application topically 2 (two) times daily. 10/13/22   Smoot, Shawn Route, PA-C  mupirocin cream (BACTROBAN) 2 % Apply 1 Application topically 2 (two) times daily. Patient not taking: Reported on 10/01/2022 06/22/22   Marita Kansas, PA-C  naproxen (NAPROSYN) 375 MG tablet Take 1 tablet (375 mg total) by mouth 2 (two) times daily as needed for headache. 10/30/22   Kommor, Madison, MD  risperiDONE (RISPERDAL) 1 MG tablet Take 1 tablet (1 mg total) by mouth at bedtime. Patient taking differently: Take 1 mg by mouth 2 (two) times daily. 01/18/22   Prosperi, Christian H, PA-C  risperiDONE (RISPERDAL) 2 MG tablet Take 1 tablet (2 mg total) by mouth 2 (two) times daily. 10/02/22   Oneta Rack, NP  amantadine (SYMMETREL) 100 MG capsule Take 1 capsule (100 mg total) by mouth 2 (two) times daily. Patient not taking: Reported on 11/04/2019 09/01/19 11/17/19  [name], NP      Allergies    Patient has no known allergies.    Review of Systems   Review of Systems  Constitutional:        Medication needs  All other systems reviewed and are negative.   Physical Exam Updated Vital Signs BP 122/77 (BP Location: Left Arm)   Pulse (!) 104   Temp 98 F (36.7 C)   Resp 16   Ht 5\' 10"  (1.778 m)   Wt 99.8 kg   SpO2 94%   BMI 31.57 kg/m   Physical Exam Vitals and nursing note reviewed.  Constitutional:       Appearance: He is well-developed.     Comments: Disheveled but appears at baseline  HENT:     Head: Normocephalic and atraumatic.  Eyes:     Conjunctiva/sclera: Conjunctivae normal.     Pupils: Pupils are equal, round, and reactive to light.  Cardiovascular:     Rate and Rhythm: Normal rate and regular rhythm.     Heart sounds: Normal heart sounds.  Pulmonary:     Effort: Pulmonary effort is normal.     Breath sounds: Normal breath sounds.  Abdominal:     General: Bowel sounds are normal.     Palpations: Abdomen is soft.  Musculoskeletal:        General: Normal range of motion.     Cervical back: Normal range of motion.  Skin:    General: Skin is warm and dry.  Neurological:     Mental Status: He is alert and oriented to person, place, and time.     ED Results / Procedures / Treatments   Labs (all labs ordered are listed, but only abnormal results are displayed) Labs Reviewed - No data to display  EKG None  Radiology No results found.  Procedures Procedures    Medications Ordered in ED Medications - No data to display  ED Course/ Medical Decision Making/ A&P  Medical Decision Making Risk Prescription drug management.   36 y.o. F here requesting dose of his risperdal and cold gingerale.  Denies SI/HI.  No other acute concerns/needs. Appears at his baseline as he is seen frequently for same. Stable for discharge.    Final Clinical Impression(s) / ED Diagnoses Final diagnoses:  Has run out of medications    Rx / DC Orders ED Discharge Orders     None         Garlon Hatchet, PA-C 11/17/22 0174    [name], MD 11/17/22 [phone]

## 2022-11-17 NOTE — ED Triage Notes (Signed)
Pt arrived stating he just needs a dose of his risperidone.

## 2022-11-18 ENCOUNTER — Encounter (HOSPITAL_COMMUNITY): Payer: Self-pay

## 2022-11-18 ENCOUNTER — Other Ambulatory Visit: Payer: Self-pay

## 2022-11-18 ENCOUNTER — Emergency Department (HOSPITAL_COMMUNITY): Payer: Self-pay

## 2022-11-18 ENCOUNTER — Emergency Department (HOSPITAL_COMMUNITY)
Admission: EM | Admit: 2022-11-18 | Discharge: 2022-11-18 | Disposition: A | Discharge status: Home / Self Care | Payer: Self-pay | Attending: Emergency Medicine | Admitting: Emergency Medicine

## 2022-11-18 DIAGNOSIS — Z8616 Personal history of COVID-19: Secondary | ICD-10-CM | POA: Insufficient documentation

## 2022-11-18 DIAGNOSIS — Z5900 Homelessness unspecified: Secondary | ICD-10-CM | POA: Insufficient documentation

## 2022-11-18 DIAGNOSIS — G44209 Tension-type headache, unspecified, not intractable: Secondary | ICD-10-CM | POA: Insufficient documentation

## 2022-11-18 MED ORDER — DIPHENHYDRAMINE HCL 25 MG PO CAPS: 25.0000 mg | ORAL_CAPSULE | Freq: Once | ORAL | Status: DC

## 2022-11-18 MED ORDER — ACETAMINOPHEN 500 MG PO TABS
1000.0000 mg | ORAL_TABLET | Freq: Once | ORAL | Status: AC
  Administered 2022-11-18: 1000 mg via ORAL
  Filled 2022-11-18: qty 2

## 2022-11-18 MED ORDER — ONDANSETRON HCL 4 MG PO TABS
4.0000 mg | ORAL_TABLET | Freq: Once | ORAL | Status: AC
  Administered 2022-11-18: 4 mg via ORAL
  Filled 2022-11-18: qty 1

## 2022-11-18 MED ORDER — PROCHLORPERAZINE EDISYLATE 10 MG/2ML IJ SOLN: 10.0000 mg | Freq: Once | INTRAMUSCULAR | Status: DC

## 2022-11-18 MED ORDER — KETOROLAC TROMETHAMINE 15 MG/ML IJ SOLN: 15.0000 mg | Freq: Once | INTRAMUSCULAR | Status: DC

## 2022-11-18 MED ORDER — IBUPROFEN 400 MG PO TABS
600.0000 mg | ORAL_TABLET | Freq: Once | ORAL | Status: AC
  Administered 2022-11-18: 600 mg via ORAL
  Filled 2022-11-18: qty 1

## 2022-11-18 MED ORDER — LACTATED RINGERS IV BOLUS: 1000.0000 mL | Freq: Once | INTRAVENOUS | Status: DC

## 2022-11-18 NOTE — ED Notes (Signed)
Patient verbalizes understanding of discharge instructions. Opportunity for questioning and answers were provided. Pt discharged from ED. 

## 2022-11-18 NOTE — ED Notes (Signed)
Pt was called x1 no answer

## 2022-11-18 NOTE — Discharge Instructions (Signed)
Thank you for coming to [facility] Emergency Department. You were seen for headache. We did an exam, and imaging, and these showed no acute findings.  Please follow up with your primary care provider within 1-2 weeks.   You can alternate taking Tylenol and ibuprofen as needed for pain. You can take 650mg  tylenol (acetaminophen) every 4-6 hours, and 600 mg ibuprofen 3 times a day.   Do not hesitate to return to the ED or call 911 if you experience: -Worsening symptoms -Worst headache of your life that comes on suddenly -Numbness/tingling, weakness on one side or another -Trouble speaking, swallowing -Lightheadedness, passing out -Fevers/chills -Anything else that concerns you

## 2022-11-18 NOTE — ED Triage Notes (Signed)
Pt c/o headache x 2 weeks, denies nausea, vomiting, blurred vision, numbness or tingling anywhere.

## 2022-11-18 NOTE — ED Provider Notes (Addendum)
[name] EMERGENCY DEPARTMENT Provider Note   CSN: [id number] Arrival date & time: 11/18/22  4174     History {Add pertinent medical, surgical, social history, OB history to HPI:1} Chief Complaint  Patient presents with   Headache    Cole Barrett is a 36 y.o. male with h/o PE, alcohol abuse, schizoaffective disorder, homelessness presents with headache.   constant daily headache for the past 2 weeks.  Denies recent illnesses.  Denies fevers.  Denies numbness, weakness, or tingling.    Headache      Home Medications Prior to Admission medications   Medication Sig Start Date End Date Taking? Authorizing Provider  bacitracin ointment Apply 1 Application topically 2 (two) times daily. 10/13/22   Smoot, Shawn Route, PA-C  mupirocin cream (BACTROBAN) 2 % Apply 1 Application topically 2 (two) times daily. Patient not taking: Reported on 10/01/2022 06/22/22   Marita Kansas, PA-C  naproxen (NAPROSYN) 375 MG tablet Take 1 tablet (375 mg total) by mouth 2 (two) times daily as needed for headache. 10/30/22   Kommor, Madison, MD  risperiDONE (RISPERDAL) 1 MG tablet Take 1 tablet (1 mg total) by mouth at bedtime. Patient taking differently: Take 1 mg by mouth 2 (two) times daily. 01/18/22   Prosperi, Christian H, PA-C  risperiDONE (RISPERDAL) 2 MG tablet Take 1 tablet (2 mg total) by mouth 2 (two) times daily. 10/02/22   Oneta Rack, NP  amantadine (SYMMETREL) 100 MG capsule Take 1 capsule (100 mg total) by mouth 2 (two) times daily. Patient not taking: Reported on 11/04/2019 09/01/19 11/17/19  [name], NP      Allergies    Patient has no known allergies.    Review of Systems   Review of Systems  Neurological:  Positive for headaches.   Review of systems {pos/neg:18640::"Negative","Positive"} for ***.  A 10 point review of systems was performed and is negative unless otherwise reported in HPI.  Physical Exam Updated Vital Signs BP 132/82   Pulse  (!) 101   Temp (!) 97.5 F (36.4 C)   Resp 17   Ht 5\' 10"  (1.778 m)   Wt 99.8 kg   SpO2 95%   BMI 31.57 kg/m  Physical Exam General: Normal appearing {Desc; male/male:11659}, lying in bed.  HEENT: PERRLA, Sclera anicteric, MMM, trachea midline.  Cardiology: RRR, no murmurs/rubs/gallops. BL radial and DP pulses equal bilaterally.  Resp: Normal respiratory rate and effort. CTAB, no wheezes, rhonchi, crackles.  Abd: Soft, non-tender, non-distended. No rebound tenderness or guarding.  GU: Deferred. MSK: No peripheral edema or signs of trauma. Extremities without deformity or TTP. No cyanosis or clubbing. Skin: warm, dry. No rashes or lesions. Back: No CVA tenderness Neuro: A&Ox4, CNs II-XII grossly intact. MAEs. Sensation grossly intact.  Psych: Normal mood and affect.   ED Results / Procedures / Treatments   Labs (all labs ordered are listed, but only abnormal results are displayed) Labs Reviewed - No data to display  EKG None  Radiology CT HEAD WO CONTRAST ( )  Result Date: 11/18/2022 CLINICAL DATA:  Headache EXAM: CT HEAD WITHOUT CONTRAST TECHNIQUE: Contiguous axial images were obtained from the base of the skull through the vertex without intravenous contrast. RADIATION DOSE REDUCTION: This exam was performed according to the departmental dose-optimization program which includes automated exposure control, adjustment of the mA and/or kV according to patient size and/or use of iterative reconstruction technique. COMPARISON:  01/03/2014 FINDINGS: Brain: No evidence of acute infarction, hemorrhage, hydrocephalus, extra-axial collection or mass  lesion/mass effect. Vascular: No hyperdense vessel or unexpected calcification. Skull: Normal. Negative for fracture or focal lesion. Sinuses/Orbits: The visualized paranasal sinuses are essentially clear. The mastoid air cells are unopacified. Other: None. IMPRESSION: Normal head CT. Electronically Signed   By: Charline Bills M.D.   On:  11/18/2022 03:11    Procedures Procedures  {Document cardiac monitor, telemetry assessment procedure when appropriate:1}  Medications Ordered in ED Medications - No data to display  ED Course/ Medical Decision Making/ A&P                          Medical Decision Making Amount and/or Complexity of Data Reviewed Radiology:  Decision-making details documented in ED Course.  Risk OTC drugs. Prescription drug management.    @HNNMDM @ *** This patient presents to the ED for concern of ***, this involves an extensive number of treatment options, and is a complaint that carries with it a high risk of complications and morbidity.  I considered the following differential and admission for this acute, potentially life threatening condition.  The differential diagnosis includes ***  MDM:    ***  (Labs, imaging, consults)  Labs: I Ordered, and personally interpreted labs.  The pertinent results include:  ***  Imaging Studies ordered: I ordered imaging studies including *** I independently visualized and interpreted imaging. I agree with the radiologist interpretation  Additional history obtained from ***.  External records from outside source obtained and reviewed including ***  Cardiac Monitoring: The patient was maintained on a cardiac monitor.  I personally viewed and interpreted the cardiac monitored which showed an underlying rhythm of: ***  Reevaluation: After the interventions noted above, I reevaluated the patient and found that they have :{resolved/improved/worsened:23923::"improved"}  Social Determinants of Health: ***  Disposition:  ***  Co morbidities that complicate the patient evaluation  Past Medical History:  Diagnosis Date   COVID-19    Homelessness    Hypercholesterolemia    Mental disorder    Paranoid behavior (HCC)    PE (pulmonary embolism)    Schizophrenia (HCC)      Medicines No orders of the defined types were placed in this encounter.    I have reviewed the patients home medicines and have made adjustments as needed  Problem List / ED Course: Problem List Items Addressed This Visit   None        Clinical Course as of 11/18/22 1246  Fri Nov 18, 2022  1204 CT HEAD WO CONTRAST (Nov 20, 2022) Normal head CT. [HN]    Clinical Course User Index [HN] , MD    {Document critical care time when appropriate:1} {Document review of labs and clinical decision tools ie heart score, Chads2Vasc2 etc:1}  {Document your independent review of radiology images, and any outside records:1} {Document your discussion with family members, caretakers, and with consultants:1} {Document social determinants of health affecting pt's care:1} {Document your decision making why or why not admission, treatments were needed:1}  This note was created using dictation software, which may contain spelling or grammatical errors.

## 2022-11-18 NOTE — ED Provider Triage Note (Signed)
  Emergency Medicine Provider Triage Evaluation Note  MRN:  [id number]  Arrival date & time: 11/18/22    Medically screening exam initiated at 2:10 AM.   CC:   Headache   HPI:  Cole Barrett is a 36 y.o. year-old male presents to the ED with chief complaint of constant daily headache for the past 2 weeks.  Denies recent illnesses.  Denies fevers.  Denies numbness, weakness, or tingling.  History provided by patient. ROS:  -As included in HPI PE:   Vitals:   11/18/22 0158  BP: 132/81  Pulse: (!) 105  Resp: 17  Temp: 97.7 F (36.5 C)  SpO2: 93%    Non-toxic appearing No respiratory distress CN 3-12 grossly intact MDM:  Based on signs and symptoms, malingering is highest on my differential. I've ordered CT head in triage to expedite lab/diagnostic workup.  Patient was informed that the remainder of the evaluation will be completed by another provider, this initial triage assessment does not replace that evaluation, and the importance of remaining in the ED until their evaluation is complete.    Roxy Horseman, PA-C 11/18/22 0211

## 2022-11-19 ENCOUNTER — Other Ambulatory Visit: Payer: Self-pay

## 2022-11-19 ENCOUNTER — Emergency Department (HOSPITAL_COMMUNITY)
Admission: EM | Admit: 2022-11-19 | Discharge: 2022-11-19 | Disposition: A | Discharge status: Home / Self Care | Payer: Self-pay | Attending: Emergency Medicine | Admitting: Emergency Medicine

## 2022-11-19 DIAGNOSIS — Z760 Encounter for issue of repeat prescription: Secondary | ICD-10-CM | POA: Insufficient documentation

## 2022-11-19 MED ORDER — BACITRACIN ZINC 500 UNIT/GM EX OINT: TOPICAL_OINTMENT | Freq: Once | CUTANEOUS | Status: DC

## 2022-11-19 MED ORDER — RISPERIDONE 0.5 MG PO TABS
1.0000 mg | ORAL_TABLET | Freq: Once | ORAL | Status: AC
  Administered 2022-11-19: 1 mg via ORAL
  Filled 2022-11-19: qty 2

## 2022-11-19 NOTE — ED Notes (Signed)
Patient provided with medication.  No additional concerns voiced at this time

## 2022-11-19 NOTE — ED Triage Notes (Signed)
Patient coming to ED for evaluation and requesting Risperdal medication.  No other complaints voiced at this time

## 2022-11-19 NOTE — ED Provider Notes (Incomplete)
[name] EMERGENCY DEPARTMENT Provider Note   CSN: [id number] Arrival date & time: 11/18/22  6754     History {Add pertinent medical, surgical, social history, OB history to HPI:1} Chief Complaint  Patient presents with  . Headache    Cole Barrett is a 36 y.o. male with h/o PE, alcohol abuse, schizoaffective disorder, homelessness presents with headache.   Patient reports a constant daily headache for the past 2 weeks. No h/o similar. Notes it is all over his head. Not associated with any visual changes, fevers, photophobia/phonophobia, numbness/tingling, weakness. Has not had any trauma. Rated 5/10 right now.  Denies recent illnesses. Patient states that he would just like oral pain medicines for pain control.    Headache      Home Medications Prior to Admission medications   Medication Sig Start Date End Date Taking? Authorizing Provider  bacitracin ointment Apply 1 Application topically 2 (two) times daily. 10/13/22   Smoot, Shawn Route, PA-C  mupirocin cream (BACTROBAN) 2 % Apply 1 Application topically 2 (two) times daily. Patient not taking: Reported on 10/01/2022 06/22/22   Marita Kansas, PA-C  naproxen (NAPROSYN) 375 MG tablet Take 1 tablet (375 mg total) by mouth 2 (two) times daily as needed for headache. 10/30/22   Kommor, Madison, MD  risperiDONE (RISPERDAL) 1 MG tablet Take 1 tablet (1 mg total) by mouth at bedtime. Patient taking differently: Take 1 mg by mouth 2 (two) times daily. 01/18/22   Prosperi, Christian H, PA-C  risperiDONE (RISPERDAL) 2 MG tablet Take 1 tablet (2 mg total) by mouth 2 (two) times daily. 10/02/22   Oneta Rack, NP  amantadine (SYMMETREL) 100 MG capsule Take 1 capsule (100 mg total) by mouth 2 (two) times daily. Patient not taking: Reported on 11/04/2019 09/01/19 11/17/19  [name], NP      Allergies    Patient has no known allergies.    Review of Systems   Review of Systems  Neurological:  Positive for  headaches.   Review of systems Negative for f/c.  A 10 point review of systems was performed and is negative unless otherwise reported in HPI.  Physical Exam Updated Vital Signs BP 137/84   Pulse 91   Temp (!) 97.5 F (36.4 C)   Resp 16   Ht 5\' 10"  (1.778 m)   Wt 99.8 kg   SpO2 99%   BMI 31.57 kg/m  Physical Exam General: Normal appearing male, lying in bed.  HEENT: PERRLA, Sclera anicteric, MMM, trachea midline.  Cardiology: RRR, no murmurs/rubs/gallops. BL radial and DP pulses equal bilaterally.  Resp: Normal respiratory rate and effort. CTAB, no wheezes, rhonchi, crackles.  Abd: Soft, non-tender, non-distended. No rebound tenderness or guarding.  GU: Deferred. MSK: No peripheral edema or signs of trauma. Extremities without deformity or TTP. No cyanosis or clubbing. Skin: warm, dry. No rashes or lesions. Back: No CVA tenderness Neuro: A&Ox4, CNs II-XII grossly intact. MAEs. Sensation grossly intact.  Psych: Normal mood and affect.   ED Results / Procedures / Treatments   Labs (all labs ordered are listed, but only abnormal results are displayed) Labs Reviewed - No data to display  EKG None  Radiology CT HEAD WO CONTRAST ( )  Result Date: 11/18/2022 CLINICAL DATA:  Headache EXAM: CT HEAD WITHOUT CONTRAST TECHNIQUE: Contiguous axial images were obtained from the base of the skull through the vertex without intravenous contrast. RADIATION DOSE REDUCTION: This exam was performed according to the departmental dose-optimization program which includes automated  exposure control, adjustment of the mA and/or kV according to patient size and/or use of iterative reconstruction technique. COMPARISON:  01/03/2014 FINDINGS: Brain: No evidence of acute infarction, hemorrhage, hydrocephalus, extra-axial collection or mass lesion/mass effect. Vascular: No hyperdense vessel or unexpected calcification. Skull: Normal. Negative for fracture or focal lesion. Sinuses/Orbits: The visualized  paranasal sinuses are essentially clear. The mastoid air cells are unopacified. Other: None. IMPRESSION: Normal head CT. Electronically Signed   By: Charline Bills M.D.   On: 11/18/2022 03:11    Procedures Procedures  {Document cardiac monitor, telemetry assessment procedure when appropriate:1}  Medications Ordered in ED Medications  acetaminophen (TYLENOL) tablet 1,000 mg (1,000 mg Oral Given 11/18/22 1358)  ibuprofen (ADVIL) tablet 600 mg (600 mg Oral Given 11/18/22 1358)  ondansetron (ZOFRAN) tablet 4 mg (4 mg Oral Given 11/18/22 1358)    ED Course/ Medical Decision Making/ A&P                          Medical Decision Making Amount and/or Complexity of Data Reviewed Radiology:  Decision-making details documented in ED Course.  Risk OTC drugs. Prescription drug management.     MDM:      Clinical Course as of 11/19/22 0001  Fri Nov 18, 2022  1204 CT HEAD WO CONTRAST ( ) Normal head CT. [HN]    Clinical Course User Index [HN] Loetta Rough, MD    Imaging Studies ordered: I ordered imaging studies including CTH I independently visualized and interpreted imaging. I agree with the radiologist interpretation  Social Determinants of Health: .***  Disposition:  ***  Co morbidities that complicate the patient evaluation . Past Medical History:  Diagnosis Date  . COVID-19   . Homelessness   . Hypercholesterolemia   . Mental disorder   . Paranoid behavior (HCC)   . PE (pulmonary embolism)   . Schizophrenia (HCC)      Medicines Meds ordered this encounter  Medications  . acetaminophen (TYLENOL) tablet 1,000 mg  . DISCONTD: ketorolac (TORADOL) 15 MG/ML injection 15 mg  . DISCONTD: lactated ringers bolus 1,000 mL  . DISCONTD: prochlorperazine (COMPAZINE) injection 10 mg  . DISCONTD: diphenhydrAMINE (BENADRYL) capsule 25 mg  . ibuprofen (ADVIL) tablet 600 mg  . ondansetron (ZOFRAN) tablet 4 mg    I have reviewed the patients home medicines and have  made adjustments as needed  Problem List / ED Course: Problem List Items Addressed This Visit   None Visit Diagnoses     Acute non intractable tension-type headache    -  Primary   Relevant Medications   acetaminophen (TYLENOL) tablet 1,000 mg (Completed)   ibuprofen (ADVIL) tablet 600 mg (Completed)            Clinical Course as of 11/19/22 0000  Fri Nov 18, 2022  1204 CT HEAD WO CONTRAST ( ) Normal head CT. [HN]    Clinical Course User Index [HN] Loetta Rough, MD    {Document critical care time when appropriate:1} {Document review of labs and clinical decision tools ie heart score, Chads2Vasc2 etc:1}  {Document your independent review of radiology images, and any outside records:1} {Document your discussion with family members, caretakers, and with consultants:1} {Document social determinants of health affecting pt's care:1} {Document your decision making why or why not admission, treatments were needed:1}  This note was created using dictation software, which may contain spelling or grammatical errors.

## 2022-11-19 NOTE — ED Provider Notes (Signed)
[facility]EMERGENCY DEPT Provider Note   CSN: [id number] Arrival date & time: 11/19/22  0325     History  Chief Complaint  Patient presents with   Medication Refill    Cole Barrett is a 36 y.o. male.  The history is provided by the patient and medical records.   35 y.o. M requesting dose of risperdal and cream for his neck.  He is well know to this hospital, seen almost nightly for same.  He has no new complaints today.  Denies SI/HI.  Home Medications Prior to Admission medications   Medication Sig Start Date End Date Taking? Authorizing Provider  bacitracin ointment Apply 1 Application topically 2 (two) times daily. 10/13/22   Smoot, Shawn Route, PA-C  mupirocin cream (BACTROBAN) 2 % Apply 1 Application topically 2 (two) times daily. Patient not taking: Reported on 10/01/2022 06/22/22   Marita Kansas, PA-C  naproxen (NAPROSYN) 375 MG tablet Take 1 tablet (375 mg total) by mouth 2 (two) times daily as needed for headache. 10/30/22   Kommor, Madison, MD  risperiDONE (RISPERDAL) 1 MG tablet Take 1 tablet (1 mg total) by mouth at bedtime. Patient taking differently: Take 1 mg by mouth 2 (two) times daily. 01/18/22   Prosperi, Christian H, PA-C  risperiDONE (RISPERDAL) 2 MG tablet Take 1 tablet (2 mg total) by mouth 2 (two) times daily. 10/02/22   Oneta Rack, NP  amantadine (SYMMETREL) 100 MG capsule Take 1 capsule (100 mg total) by mouth 2 (two) times daily. Patient not taking: Reported on 11/04/2019 09/01/19 11/17/19  [name], NP      Allergies    Patient has no known allergies.    Review of Systems   Review of Systems  Constitutional:        Medication needs  All other systems reviewed and are negative.   Physical Exam Updated Vital Signs BP (!) 147/76 (BP Location: Right Arm)   Pulse 89   Temp 98.5 F (36.9 C) (Oral)   Resp 18   Ht 5\' 10"  (1.778 m)   Wt 99.8 kg   SpO2 98%   BMI 31.57 kg/m   Physical Exam Vitals and nursing  note reviewed.  Constitutional:      Appearance: He is well-developed.  HENT:     Head: Normocephalic and atraumatic.  Eyes:     Conjunctiva/sclera: Conjunctivae normal.     Pupils: Pupils are equal, round, and reactive to light.  Cardiovascular:     Rate and Rhythm: Normal rate and regular rhythm.     Heart sounds: Normal heart sounds.  Pulmonary:     Effort: Pulmonary effort is normal.     Breath sounds: Normal breath sounds.  Abdominal:     General: Bowel sounds are normal.     Palpations: Abdomen is soft.  Musculoskeletal:        General: Normal range of motion.     Cervical back: Normal range of motion.  Skin:    General: Skin is warm and dry.  Neurological:     Mental Status: He is alert and oriented to person, place, and time.     ED Results / Procedures / Treatments   Labs (all labs ordered are listed, but only abnormal results are displayed) Labs Reviewed - No data to display  EKG None  Radiology CT HEAD WO CONTRAST ( )  Result Date: 11/18/2022 CLINICAL DATA:  Headache EXAM: CT HEAD WITHOUT CONTRAST TECHNIQUE: Contiguous axial images were obtained from the base  of the skull through the vertex without intravenous contrast. RADIATION DOSE REDUCTION: This exam was performed according to the departmental dose-optimization program which includes automated exposure control, adjustment of the mA and/or kV according to patient size and/or use of iterative reconstruction technique. COMPARISON:  01/03/2014 FINDINGS: Brain: No evidence of acute infarction, hemorrhage, hydrocephalus, extra-axial collection or mass lesion/mass effect. Vascular: No hyperdense vessel or unexpected calcification. Skull: Normal. Negative for fracture or focal lesion. Sinuses/Orbits: The visualized paranasal sinuses are essentially clear. The mastoid air cells are unopacified. Other: None. IMPRESSION: Normal head CT. Electronically Signed   By: Julian Hy M.D.   On: 11/18/2022 03:11     Procedures Procedures    Medications Ordered in ED Medications  bacitracin ointment (has no administration in time range)  risperiDONE (RISPERDAL) tablet 1 mg (has no administration in time range)    ED Course/ Medical Decision Making/ A&P                           Medical Decision Making Risk OTC drugs. Prescription drug management.   36 year old male presenting to the ED requesting dose of Resporal and cream for his neck.  He has seen numerous nightly for same.  He appears at his baseline.  He has no acute complaints today.  Given his medications, stable for discharge.  Can return here for new concerns.  Final Clinical Impression(s) / ED Diagnoses Final diagnoses:  Medication refill    Rx / DC Orders ED Discharge Orders     None         Larene Pickett, PA-C 11/19/22 0456    Maudie Flakes, MD 11/19/22 [phone]

## 2022-11-19 NOTE — Discharge Instructions (Addendum)
Follow-up with your doctor. Return here for new concerns. 

## 2022-11-20 ENCOUNTER — Emergency Department (HOSPITAL_COMMUNITY)
Admission: EM | Admit: 2022-11-20 | Discharge: 2022-11-20 | Disposition: A | Discharge status: Home / Self Care | Payer: Self-pay | Attending: Emergency Medicine | Admitting: Emergency Medicine

## 2022-11-20 ENCOUNTER — Encounter (HOSPITAL_COMMUNITY): Payer: Self-pay | Admitting: Emergency Medicine

## 2022-11-20 DIAGNOSIS — R21 Rash and other nonspecific skin eruption: Secondary | ICD-10-CM | POA: Insufficient documentation

## 2022-11-20 DIAGNOSIS — R519 Headache, unspecified: Secondary | ICD-10-CM | POA: Insufficient documentation

## 2022-11-20 MED ORDER — BACITRACIN ZINC 500 UNIT/GM EX OINT
TOPICAL_OINTMENT | Freq: Once | CUTANEOUS | Status: AC
  Filled 2022-11-20: qty 28.4

## 2022-11-20 MED ORDER — ACETAMINOPHEN 500 MG PO TABS
1000.0000 mg | ORAL_TABLET | Freq: Once | ORAL | Status: AC
  Administered 2022-11-20: 1000 mg via ORAL
  Filled 2022-11-20: qty 2

## 2022-11-20 NOTE — Discharge Instructions (Signed)
Tylenol or motrin if headache continues.

## 2022-11-20 NOTE — ED Provider Notes (Signed)
[facility] EMERGENCY DEPARTMENT Provider Note   CSN: [id number] Arrival date & time: 11/20/22  0107     History  Chief Complaint  Patient presents with   Headache    Cole Barrett is a 36 y.o. male.  The history is provided by the patient and medical records.  Headache  36 year old male presenting to the ED requesting dose of medicine for his head and some bacitracin for rash on his arm.  He denies any dizziness, nausea, or vomiting.  No fever.  No medication taken prior to arrival.  Home Medications Prior to Admission medications   Medication Sig Start Date End Date Taking? Authorizing Provider  bacitracin ointment Apply 1 Application topically 2 (two) times daily. 10/13/22   Smoot, Shawn Route, PA-C  mupirocin cream (BACTROBAN) 2 % Apply 1 Application topically 2 (two) times daily. Patient not taking: Reported on 10/01/2022 06/22/22   Marita Kansas, PA-C  naproxen (NAPROSYN) 375 MG tablet Take 1 tablet (375 mg total) by mouth 2 (two) times daily as needed for headache. 10/30/22   Kommor, Madison, MD  risperiDONE (RISPERDAL) 1 MG tablet Take 1 tablet (1 mg total) by mouth at bedtime. Patient taking differently: Take 1 mg by mouth 2 (two) times daily. 01/18/22   Prosperi, Christian H, PA-C  risperiDONE (RISPERDAL) 2 MG tablet Take 1 tablet (2 mg total) by mouth 2 (two) times daily. 10/02/22   Oneta Rack, NP  amantadine (SYMMETREL) 100 MG capsule Take 1 capsule (100 mg total) by mouth 2 (two) times daily. Patient not taking: Reported on 11/04/2019 09/01/19 11/17/19  [name], NP      Allergies    Patient has no known allergies.    Review of Systems   Review of Systems  Neurological:  Positive for headaches.  All other systems reviewed and are negative.   Physical Exam Updated Vital Signs BP 119/79 (BP Location: Left Arm)   Pulse 85   Temp 97.8 F (36.6 C) (Oral)   Resp 16   SpO2 97%   Physical Exam Vitals and nursing note reviewed.   Constitutional:      General: He is not in acute distress.    Appearance: He is well-developed. He is not diaphoretic.  HENT:     Head: Normocephalic and atraumatic.     Right Ear: External ear normal.     Left Ear: External ear normal.  Eyes:     Conjunctiva/sclera: Conjunctivae normal.     Pupils: Pupils are equal, round, and reactive to light.  Neck:     Comments: No rigidity, no meningismus Cardiovascular:     Rate and Rhythm: Normal rate and regular rhythm.     Heart sounds: Normal heart sounds. No murmur heard. Pulmonary:     Effort: Pulmonary effort is normal. No respiratory distress.     Breath sounds: Normal breath sounds. No wheezing or rhonchi.  Abdominal:     General: Bowel sounds are normal.     Palpations: Abdomen is soft.     Tenderness: There is no abdominal tenderness. There is no guarding.  Musculoskeletal:        General: Normal range of motion.     Cervical back: Full passive range of motion without pain, normal range of motion and neck supple. No rigidity.  Skin:    General: Skin is warm and dry.     Findings: No rash.  Neurological:     Mental Status: He is alert and oriented to person,  place, and time.     Cranial Nerves: No cranial nerve deficit.     Sensory: No sensory deficit.     Motor: No tremor or seizure activity.     Comments: AAOx3, answering questions and following commands appropriately; equal strength UE and LE bilaterally; CN grossly intact; moves all extremities appropriately without ataxia; no focal neuro deficits or facial asymmetry appreciated  Psychiatric:        Behavior: Behavior normal.        Thought Content: Thought content normal.     ED Results / Procedures / Treatments   Labs (all labs ordered are listed, but only abnormal results are displayed) Labs Reviewed - No data to display  EKG None  Radiology CT HEAD WO CONTRAST ( )  Result Date: 11/18/2022 CLINICAL DATA:  Headache EXAM: CT HEAD WITHOUT CONTRAST  TECHNIQUE: Contiguous axial images were obtained from the base of the skull through the vertex without intravenous contrast. RADIATION DOSE REDUCTION: This exam was performed according to the departmental dose-optimization program which includes automated exposure control, adjustment of the mA and/or kV according to patient size and/or use of iterative reconstruction technique. COMPARISON:  01/03/2014 FINDINGS: Brain: No evidence of acute infarction, hemorrhage, hydrocephalus, extra-axial collection or mass lesion/mass effect. Vascular: No hyperdense vessel or unexpected calcification. Skull: Normal. Negative for fracture or focal lesion. Sinuses/Orbits: The visualized paranasal sinuses are essentially clear. The mastoid air cells are unopacified. Other: None. IMPRESSION: Normal head CT. Electronically Signed   By: Charline Bills M.D.   On: 11/18/2022 03:11    Procedures Procedures    Medications Ordered in ED Medications - No data to display  ED Course/ Medical Decision Making/ A&P                           Medical Decision Making Risk OTC drugs.   36 year old male presenting to the ED requesting something for headache and bacitracin cream for rash on his arm. Well known to this facility for similar. He is awake, alert, oriented to his baseline.  He has no focal neurologic deficits.  No meningeal signs.  Given dose of Tylenol and bacitracin as requested.  Stable for discharge.  Final Clinical Impression(s) / ED Diagnoses Final diagnoses:  Nonintractable headache, unspecified chronicity pattern, unspecified headache type    Rx / DC Orders ED Discharge Orders     None         Garlon Hatchet, PA-C 11/20/22 0129    Gilda Crease, MD 11/20/22 [phone]

## 2022-11-20 NOTE — ED Triage Notes (Signed)
PT is suspicious of malingering. States he needs medicine for headache. RN discussed that providers will not give him prescription. He said 1 will be fine. He is also requesting bacitracin for arm sores. He is eating meal in triage room.

## 2022-11-22 ENCOUNTER — Emergency Department (HOSPITAL_COMMUNITY)
Admission: EM | Admit: 2022-11-22 | Discharge: 2022-11-23 | Disposition: A | Discharge status: Home / Self Care | Payer: Self-pay | Attending: Emergency Medicine | Admitting: Emergency Medicine

## 2022-11-22 DIAGNOSIS — Z760 Encounter for issue of repeat prescription: Secondary | ICD-10-CM | POA: Insufficient documentation

## 2022-11-22 DIAGNOSIS — Z7689 Persons encountering health services in other specified circumstances: Secondary | ICD-10-CM

## 2022-11-22 DIAGNOSIS — F209 Schizophrenia, unspecified: Secondary | ICD-10-CM | POA: Insufficient documentation

## 2022-11-22 DIAGNOSIS — Z8616 Personal history of COVID-19: Secondary | ICD-10-CM | POA: Insufficient documentation

## 2022-11-22 DIAGNOSIS — F17210 Nicotine dependence, cigarettes, uncomplicated: Secondary | ICD-10-CM | POA: Insufficient documentation

## 2022-11-23 ENCOUNTER — Other Ambulatory Visit: Payer: Self-pay

## 2022-11-23 MED ORDER — RISPERIDONE 2 MG PO TABS: 2.0000 mg | ORAL_TABLET | Freq: Two times a day (BID) | ORAL | 0 refills | Status: DC

## 2022-11-23 MED ORDER — RISPERIDONE 1 MG PO TABS: 1.0000 mg | ORAL_TABLET | Freq: Once | ORAL | Status: DC

## 2022-11-23 MED ORDER — RISPERIDONE 2 MG PO TABS
2.0000 mg | ORAL_TABLET | Freq: Once | ORAL | Status: AC
  Administered 2022-11-23: 2 mg via ORAL
  Filled 2022-11-23: qty 1

## 2022-11-23 NOTE — ED Provider Notes (Signed)
[facility] EMERGENCY DEPARTMENT Provider Note  CSN: [id number] Arrival date & time: 11/22/22 2354  Chief Complaint(s) Medication Refill  HPI Cole Barrett is a 36 y.o. male with history of homelessness, schizophrenia presenting asking for medication.  He wants a Risperdal.  He reports that he has this at home but he did not bring it so he came to the emergency department.  He denies any complaints such as nausea, vomiting, fevers, chills, chest pain, auditory or visual hallucinations, suicidal or homicidal ideation.   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 Authorizing Provider  bacitracin ointment Apply 1 Application topically 2 (two) times daily. 10/13/22   Smoot, Shawn Route, PA-C  mupirocin cream (BACTROBAN) 2 % Apply 1 Application topically 2 (two) times daily. Patient not taking: Reported on 10/01/2022 06/22/22   Marita Kansas, PA-C  naproxen (NAPROSYN) 375 MG tablet Take 1 tablet (375 mg total) by mouth 2 (two) times  daily as needed for headache. 10/30/22   Kommor, Madison, MD  risperiDONE (RISPERDAL) 1 MG tablet Take 1 tablet (1 mg total) by mouth at bedtime. Patient taking differently: Take 1 mg by mouth 2 (two) times daily. 01/18/22   Prosperi, Christian H, PA-C  risperiDONE (RISPERDAL) 2 MG tablet Take 1 tablet (2 mg total) by mouth 2 (two) times daily. 10/02/22   Oneta Rack, NP  amantadine (SYMMETREL) 100 MG capsule Take 1 capsule (100 mg total) by mouth 2 (two) times daily. Patient not taking: Reported on 11/04/2019 09/01/19 11/17/19  [name], NP                                                                                                                                    Past Surgical History No past surgical history on file. Family History Family History  Problem Relation Age of Onset   Hypertension Mother     Social History Social History   Tobacco Use   Smoking status: Every Day  Packs/day: 0.50    Types: Cigarettes   Smokeless tobacco: Never  Vaping Use   Vaping Use: Never used  Substance Use Topics   Alcohol use: Yes    Comment: BAC not available   Drug use: Yes    Types: Marijuana    Comment: Last used: 2 months ago UDS NA   Allergies Patient has no known allergies.  Review of Systems Review of Systems  All other systems reviewed and are negative.   Physical Exam Vital Signs  I have reviewed the triage vital signs BP 130/70 (BP Location: Right Arm)   Pulse 83   Temp 98.3 F (36.8 C) (Oral)   Resp 16   Wt 99 kg   SpO2 96%   BMI 31.32 kg/m  Physical Exam Vitals and nursing note reviewed.  Constitutional:      General: He is not in acute distress.    Appearance: Normal appearance.  HENT:     Head: Normocephalic and atraumatic.     Mouth/Throat:     Mouth: Mucous membranes are moist.  Eyes:     Conjunctiva/sclera: Conjunctivae normal.  Cardiovascular:     Rate and Rhythm: Normal rate.  Pulmonary:     Effort: Pulmonary effort is normal. No  respiratory distress.  Abdominal:     General: Abdomen is flat.  Skin:    General: Skin is warm and dry.     Capillary Refill: Capillary refill takes less than 2 seconds.  Neurological:     General: No focal deficit present.     Mental Status: He is alert. Mental status is at baseline.  Psychiatric:        Mood and Affect: Mood normal.        Behavior: Behavior normal.     ED Results and Treatments Labs (all labs ordered are listed, but only abnormal results are displayed) Labs Reviewed - No data to display                                                                                                                        Radiology No results found.  Pertinent labs & imaging results that were available during my care of the patient were reviewed by me and considered in my medical decision making (see MDM for details).  Medications Ordered in ED Medications  risperiDONE (RISPERDAL) tablet 2 mg (has no administration in time range)  Procedures Procedures  (including critical care time)  Medical Decision Making / ED Course   MDM:  36 year old male presenting to the emergency department asking for risperidone tablet.  Patient is well-appearing, he is not responding to internal stimuli.  He is prescribed risperidone at home.  Will give single tablet here.  Offered prescription but patient refused.  He reports that he has risperidone at home.  He does present to the emergency department quite frequently for similar requests.  He has no evidence of acute psychiatric illness such as acute psychosis, homicidal or suicidal ideation.  He denies any medical complaints.       Medicines ordered and prescription drug management: Meds ordered this encounter  Medications   DISCONTD: risperiDONE (RISPERDAL) tablet 1 mg   risperiDONE (RISPERDAL) tablet  2 mg    -I have reviewed the patients home medicines and have made adjustments as needed   Social Determinants of Health:  Diagnosis or treatment significantly limited by social determinants of health: homelessness   Reevaluation: After the interventions noted above, I reevaluated the patient and found that they have improved  Co morbidities that complicate the patient evaluation  Past Medical History:  Diagnosis Date   COVID-19    Homelessness    Hypercholesterolemia    Mental disorder    Paranoid behavior (HCC)    PE (pulmonary embolism)    Schizophrenia (HCC)       Dispostion: Disposition decision including need for hospitalization was considered, and patient discharged from emergency department.    Final Clinical Impression(s) / ED Diagnoses Final diagnoses:  Encounter for medication administration  Schizophrenia, unspecified type (HCC)     This chart was dictated using voice recognition software.  Despite best efforts to proofread,  errors can occur which can change the documentation meaning.    Lonell Grandchild, MD 11/23/22 1201

## 2022-11-23 NOTE — Discharge Instructions (Addendum)
We gave you a dose of your medication.  Please take this at home and pick up your prescription.

## 2022-11-23 NOTE — ED Triage Notes (Signed)
Pt would like a refill of risperdal. No other complaints at this time.

## 2022-11-23 NOTE — ED Triage Notes (Signed)
No answer in lobby x2, staff notes that pt went to cafeteria prior to triage

## 2022-11-23 NOTE — ED Provider Triage Note (Signed)
  Emergency Medicine Provider Triage Evaluation Note  MRN:  [id number]  Arrival date & time: 11/23/22    Medically screening exam initiated at 12:56 AM.   CC:   Medication Refill   HPI:  Cole Barrett is a 36 y.o. year-old male presents to the ED with chief complaint of medication refill.  States that he hasn't had his risperdal.  This is a recurrent problem.  No other complaints.  History provided by patient. ROS:  -As included in HPI PE:   Vitals:   11/23/22 0051  BP: 125/67  Pulse: 92  Resp: 16  Temp: 97.9 F (36.6 C)  SpO2: 97%    Non-toxic appearing No respiratory distress  MDM:  Based on signs and symptoms, housing instability is highest on my differential.   Patient was informed that the remainder of the evaluation will be completed by another provider, this initial triage assessment does not replace that evaluation, and the importance of remaining in the ED until their evaluation is complete.    Roxy Horseman, PA-C 11/23/22 [phone]

## 2022-11-24 ENCOUNTER — Emergency Department (HOSPITAL_COMMUNITY)
Admission: EM | Admit: 2022-11-24 | Discharge: 2022-11-25 | Disposition: A | Discharge status: Home / Self Care | Payer: Self-pay | Attending: Emergency Medicine | Admitting: Emergency Medicine

## 2022-11-24 DIAGNOSIS — S59911A Unspecified injury of right forearm, initial encounter: Secondary | ICD-10-CM | POA: Insufficient documentation

## 2022-11-24 DIAGNOSIS — Z760 Encounter for issue of repeat prescription: Secondary | ICD-10-CM | POA: Insufficient documentation

## 2022-11-24 DIAGNOSIS — S4991XA Unspecified injury of right shoulder and upper arm, initial encounter: Secondary | ICD-10-CM

## 2022-11-24 DIAGNOSIS — Z5900 Homelessness unspecified: Secondary | ICD-10-CM | POA: Insufficient documentation

## 2022-11-24 DIAGNOSIS — W228XXA Striking against or struck by other objects, initial encounter: Secondary | ICD-10-CM | POA: Insufficient documentation

## 2022-11-25 ENCOUNTER — Emergency Department (HOSPITAL_COMMUNITY)
Admission: EM | Admit: 2022-11-25 | Discharge: 2022-11-26 | Disposition: A | Discharge status: Home / Self Care | Payer: Self-pay | Attending: Emergency Medicine | Admitting: Emergency Medicine

## 2022-11-25 DIAGNOSIS — M79601 Pain in right arm: Secondary | ICD-10-CM | POA: Insufficient documentation

## 2022-11-25 MED ORDER — IBUPROFEN 800 MG PO TABS
800.0000 mg | ORAL_TABLET | Freq: Once | ORAL | Status: AC
  Administered 2022-11-25: 800 mg via ORAL
  Filled 2022-11-25: qty 1

## 2022-11-25 NOTE — ED Notes (Signed)
Pt called multiple times for triage no answer

## 2022-11-25 NOTE — ED Notes (Signed)
The pt checked in and then went to [employer] to get food before he could be triaged

## 2022-11-25 NOTE — ED Provider Notes (Signed)
[facility] EMERGENCY DEPARTMENT Provider Note   CSN: [id number] Arrival date & time: 11/24/22  2158     History  Chief Complaint  Patient presents with   Medication Refill    Cole Barrett is a 36 y.o. male with medical history of homelessness, paranoid behavior, schizophrenia.  Patient presents to ED for medication refill.  When I went to engage with the patient, patient stated that he was just here for "some pain pills".  Patient reports that yesterday at some point he struck his arm on an unknown object causing pain to his forearm.  Patient unable to tell me what struck his arm.  Patient states that he just "need some pain pills" and then he will let arm pain "work itself out".  Patient denies taking any OTC medications prior to arrival.  Patient denies any numbness or tingling into her right arm.  Of note, this is the patient's 59th ER visit in the last 6 months.  Patient was seen 2 days prior for medication refill at [facility] where he was requesting risperdal.   Medication Refill      Home Medications Prior to Admission medications   Medication Sig Start Date End Date Taking? Authorizing Provider  bacitracin ointment Apply 1 Application topically 2 (two) times daily. 10/13/22   Smoot, Shawn Route, PA-C  mupirocin cream (BACTROBAN) 2 % Apply 1 Application topically 2 (two) times daily. Patient not taking: Reported on 10/01/2022 06/22/22   Marita Kansas, PA-C  naproxen (NAPROSYN) 375 MG tablet Take 1 tablet (375 mg total) by mouth 2 (two) times daily as needed for headache. 10/30/22   Kommor, Madison, MD  risperiDONE (RISPERDAL) 1 MG tablet Take 1 tablet (1 mg total) by mouth at bedtime. Patient taking differently: Take 1 mg by mouth 2 (two) times daily. 01/18/22   Prosperi, Christian H, PA-C  risperiDONE (RISPERDAL) 2 MG tablet Take 1 tablet (2 mg total) by mouth 2 (two) times daily. 11/23/22   Lonell Grandchild, MD  amantadine (SYMMETREL) 100 MG  capsule Take 1 capsule (100 mg total) by mouth 2 (two) times daily. Patient not taking: Reported on 11/04/2019 09/01/19 11/17/19  [name], NP      Allergies    Patient has no known allergies.    Review of Systems   Review of Systems  Musculoskeletal:  Positive for myalgias.  All other systems reviewed and are negative.   Physical Exam Updated Vital Signs BP 115/82 (BP Location: Right Arm)   Pulse 75   Temp 98 F (36.7 C) (Oral)   Resp 16   SpO2 99%  Physical Exam Vitals and nursing note reviewed.  Constitutional:      General: He is not in acute distress.    Appearance: He is well-developed.  HENT:     Head: Normocephalic and atraumatic.     Nose: Nose normal.     Mouth/Throat:     Mouth: Mucous membranes are moist.     Pharynx: Oropharynx is clear.  Eyes:     Conjunctiva/sclera: Conjunctivae normal.  Cardiovascular:     Rate and Rhythm: Normal rate and regular rhythm.     Heart sounds: No murmur heard. Pulmonary:     Effort: Pulmonary effort is normal. No respiratory distress.     Breath sounds: Normal breath sounds.  Abdominal:     Palpations: Abdomen is soft.     Tenderness: There is no abdominal tenderness.  Musculoskeletal:  General: No swelling.     Cervical back: Neck supple.     Comments: Right upper extremity forearm examined without any findings of deformity, swelling, overlying skin change, abrasions.  Patient has full range of motion of elbow joint.  Skin:    General: Skin is warm and dry.     Capillary Refill: Capillary refill takes less than 2 seconds.  Neurological:     Mental Status: He is alert and oriented to person, place, and time.  Psychiatric:        Mood and Affect: Mood normal.     ED Results / Procedures / Treatments   Labs (all labs ordered are listed, but only abnormal results are displayed) Labs Reviewed - No data to display  EKG None  Radiology No results found.  Procedures Procedures   Medications Ordered  in ED Medications  ibuprofen (ADVIL) tablet 800 mg (has no administration in time range)    ED Course/ Medical Decision Making/ A&P                           Medical Decision Making  36 year old male presents to ED for evaluation of right arm injury.  Please see HPI for further details.  On examination the patient right arm has no overlying skin change, no swelling, no deformity.  Patient has full range of motion of elbow joint as well as wrist joint.  The patient is here requesting "pain pills".  I have offered the patient an x-ray however he defers on this stating that he just "need some pain pills to let this arm pain work itself out".  I offered the patient 600 mg of ibuprofen.  He was amenable to this.  The patient was advised to follow back up with his PCP or return to the ED with any new or worsening signs or symptoms.  Return precautions were given and he voiced understanding.  Patient had all of his questions answered to his satisfaction.  The patient is stable for discharge.  Final Clinical Impression(s) / ED Diagnoses Final diagnoses:  Arm injury, right, initial encounter    Rx / DC Orders ED Discharge Orders     None         Amarri, Satterly, PA-C 11/25/22 1027    Cathren Laine, MD 11/26/22 1454

## 2022-11-25 NOTE — Discharge Instructions (Signed)
Please return to ED with any new or worsening signs or symptoms I have offered you an x-ray of your arm today however you have deferred on this.  I am going to give you 800 mg of ibuprofen for pain control as we discussed.  If your arm pain increases please return to the ED for further evaluation of arm injury.

## 2022-11-26 ENCOUNTER — Encounter (HOSPITAL_COMMUNITY): Payer: Self-pay | Admitting: *Deleted

## 2022-11-26 ENCOUNTER — Other Ambulatory Visit: Payer: Self-pay

## 2022-11-26 MED ORDER — IBUPROFEN 800 MG PO TABS
800.0000 mg | ORAL_TABLET | Freq: Once | ORAL | Status: AC
  Administered 2022-11-26: 800 mg via ORAL
  Filled 2022-11-26: qty 1

## 2022-11-26 NOTE — ED Provider Notes (Signed)
[facility] EMERGENCY DEPARTMENT Provider Note   CSN: [id number] Arrival date & time: 11/25/22  2341     History  No chief complaint on file.   Cole Barrett is a 36 y.o. male.  Patient well-known to the emergency department presents to the emergency department for pain in his right arm.  He states that he hurt it at work a couple of days ago.  He was seen in the emergency department and was treated with ibuprofen yesterday.  He states that he has been "working out the arm" to see if it helped, but pain persists.  He feels that the arm is swollen.  No distal numbness or tingling.  No head or neck injury.  States that he hit it on something while he was at work.  Denies falls or other injuries.  He has a tube of bacitracin that he was given on 12/24 that he states he has been rubbing on the area.       Home Medications Prior to Admission medications   Medication Sig Start Date End Date Taking? Authorizing Provider  bacitracin ointment Apply 1 Application topically 2 (two) times daily. 10/13/22   Smoot, Shawn Route, PA-C  mupirocin cream (BACTROBAN) 2 % Apply 1 Application topically 2 (two) times daily. Patient not taking: Reported on 10/01/2022 06/22/22   Marita Kansas, PA-C  naproxen (NAPROSYN) 375 MG tablet Take 1 tablet (375 mg total) by mouth 2 (two) times daily as needed for headache. 10/30/22   Kommor, Madison, MD  risperiDONE (RISPERDAL) 1 MG tablet Take 1 tablet (1 mg total) by mouth at bedtime. Patient taking differently: Take 1 mg by mouth 2 (two) times daily. 01/18/22   Prosperi, Christian H, PA-C  risperiDONE (RISPERDAL) 2 MG tablet Take 1 tablet (2 mg total) by mouth 2 (two) times daily. 11/23/22   Lonell Grandchild, MD  amantadine (SYMMETREL) 100 MG capsule Take 1 capsule (100 mg total) by mouth 2 (two) times daily. Patient not taking: Reported on 11/04/2019 09/01/19 11/17/19  [name], NP      Allergies    Patient has no known allergies.     Review of Systems   Review of Systems  Physical Exam Updated Vital Signs BP 118/75   Pulse 89   Temp (!) 97.4 F (36.3 C) (Oral)   Resp 18   Ht 5\' 10"  (1.778 m)   Wt 99 kg   SpO2 92%   BMI 31.32 kg/m  Physical Exam Vitals and nursing note reviewed.  Constitutional:      Appearance: He is well-developed.  HENT:     Head: Normocephalic and atraumatic.  Eyes:     Conjunctiva/sclera: Conjunctivae normal.  Cardiovascular:     Pulses: Normal pulses. No decreased pulses.  Musculoskeletal:        General: Tenderness present.     Right shoulder: No bony tenderness. Normal range of motion.     Right elbow: Normal range of motion. Tenderness present in olecranon process.     Right forearm: No tenderness or bony tenderness.     Right wrist: No tenderness. Normal range of motion.     Cervical back: Normal range of motion and neck supple.     Right lower leg: No edema.     Left lower leg: No edema.     Comments: Distal radial pulses normal.  No evidence of compartment syndrome in the forearm.  Skin:    General: Skin is warm and dry.  Neurological:  Mental Status: He is alert.     Sensory: No sensory deficit.     Comments: Motor, sensation, and vascular distal to the injury is fully intact.   Psychiatric:        Mood and Affect: Mood normal.     ED Results / Procedures / Treatments   Labs (all labs ordered are listed, but only abnormal results are displayed) Labs Reviewed - No data to display  EKG None  Radiology No results found.  Procedures Procedures    Medications Ordered in ED Medications  ibuprofen (ADVIL) tablet 800 mg (has no administration in time range)    ED Course/ Medical Decision Making/ A&P    Patient seen and examined. History obtained directly from patient.  Reviewed previous ED visit notes.  Labs/EKG: None ordered  Imaging: None ordered.  Considered x-ray, however clinical exam without any significant swelling or contusion, reassuring  exam.  Low clinical suspicion for fracture or dislocation.  Medications/Fluids: Ordered: P.o. ibuprofen   Most recent vital signs reviewed and are as follows: BP 118/75   Pulse 89   Temp (!) 97.4 F (36.3 C) (Oral)   Resp 18   Ht 5\' 10"  (1.778 m)   Wt 99 kg   SpO2 92%   BMI 31.32 kg/m   Initial impression: Arm pain, malingering  Home treatment plan: RICE protocol  Return instructions discussed with patient: As needed  Follow-up instructions discussed with patient: Follow-up with PCP in 1 week for recheck if not improved.                          Medical Decision Making Risk Prescription drug management.   Patient with arm pain.  Low clinical suspicion for fracture.  No signs of compartment syndrome.  No signs of active cellulitis or abscess as cause of pain.  Conservative measures indicated.  Of note, 59 ED visits in the past 6 months.          Final Clinical Impression(s) / ED Diagnoses Final diagnoses:  Pain of right upper extremity    Rx / DC Orders ED Discharge Orders     None         , PA-C 11/26/22 0041    11/28/22, MD 11/26/22 [phone]

## 2022-11-26 NOTE — ED Triage Notes (Signed)
He's here for pain meds for his rt arm that is hurting

## 2022-11-26 NOTE — ED Triage Notes (Signed)
Unable to triage this pt he reports that he needs to get something from the waiting room

## 2022-11-29 ENCOUNTER — Other Ambulatory Visit: Payer: Self-pay

## 2022-11-29 ENCOUNTER — Encounter (HOSPITAL_COMMUNITY): Payer: Self-pay | Admitting: Emergency Medicine

## 2022-11-29 ENCOUNTER — Emergency Department (HOSPITAL_COMMUNITY)
Admission: EM | Admit: 2022-11-29 | Discharge: 2022-11-29 | Disposition: A | Discharge status: Home / Self Care | Payer: Self-pay | Attending: Emergency Medicine | Admitting: Emergency Medicine

## 2022-11-29 DIAGNOSIS — M79601 Pain in right arm: Secondary | ICD-10-CM | POA: Insufficient documentation

## 2022-11-29 MED ORDER — BACITRACIN ZINC 500 UNIT/GM EX OINT: TOPICAL_OINTMENT | Freq: Two times a day (BID) | CUTANEOUS | Status: DC

## 2022-11-29 MED ORDER — IBUPROFEN 400 MG PO TABS
600.0000 mg | ORAL_TABLET | Freq: Once | ORAL | Status: DC
  Filled 2022-11-29: qty 1

## 2022-11-29 NOTE — ED Provider Notes (Signed)
[facility] EMERGENCY DEPARTMENT Provider Note   CSN: [id number] Arrival date & time: 11/29/22  0454     History  Chief Complaint  Patient presents with   Arm Pain    Cole Barrett is a 37 y.o. male who presents with concern for right arm soreness requesting ibuprofen and antibiotic ointment for his chronic left neck wound.  Patient reportedly hurt his arm couple of weeks ago at work and has had some soreness since that time, been treated with ibuprofen in the ER few times since that injury without concern for fracture or dislocation.  Patient preferring to keep his coat on, not willing to remove it for physical exam.  I personally read his medical records.  Alcohol use disorder, schizo knee.  Well-known to this department with 60 visits in the last 6 months. HPI     Home Medications Prior to Admission medications   Medication Sig Start Date End Date Taking? Authorizing Provider  bacitracin ointment Apply 1 Application topically 2 (two) times daily. 10/13/22   Smoot, Leary Roca, PA-C  mupirocin cream (BACTROBAN) 2 % Apply 1 Application topically 2 (two) times daily. Patient not taking: Reported on 10/01/2022 06/22/22   Evlyn Courier, PA-C  naproxen (NAPROSYN) 375 MG tablet Take 1 tablet (375 mg total) by mouth 2 (two) times daily as needed for headache. 10/30/22   Kommor, Madison, MD  risperiDONE (RISPERDAL) 1 MG tablet Take 1 tablet (1 mg total) by mouth at bedtime. Patient taking differently: Take 1 mg by mouth 2 (two) times daily. 01/18/22   Prosperi, Christian H, PA-C  risperiDONE (RISPERDAL) 2 MG tablet Take 1 tablet (2 mg total) by mouth 2 (two) times daily. 11/23/22   Cristie Hem, MD  amantadine (SYMMETREL) 100 MG capsule Take 1 capsule (100 mg total) by mouth 2 (two) times daily. Patient not taking: Reported on 11/04/2019 09/01/19 11/17/19  Mallie Darting, NP      Allergies    Patient has no known allergies.    Review of Systems   Review of  Systems  Musculoskeletal:        Right arm pain    Physical Exam Updated Vital Signs BP 133/84 (BP Location: Left Arm)   Pulse 86   Temp 98 F (36.7 C) (Oral)   Resp 18   SpO2 95%  Physical Exam Vitals and nursing note reviewed.  Constitutional:      Appearance: He is obese. He is not ill-appearing or toxic-appearing.  HENT:     Head: Normocephalic and atraumatic.   Eyes:     General: No scleral icterus.       Right eye: No discharge.        Left eye: No discharge.     Extraocular Movements: Extraocular movements intact.     Conjunctiva/sclera: Conjunctivae normal.     Pupils: Pupils are equal, round, and reactive to light.  Cardiovascular:     Heart sounds: Normal heart sounds. No murmur heard. Pulmonary:     Effort: Pulmonary effort is normal.  Skin:    General: Skin is warm and dry.     Capillary Refill: Capillary refill takes less than 2 seconds.  Neurological:     General: No focal deficit present.     Mental Status: He is alert.  Psychiatric:        Mood and Affect: Mood normal.     ED Results / Procedures / Treatments   Labs (all labs ordered are listed, but only  abnormal results are displayed) Labs Reviewed - No data to display  EKG None  Radiology No results found.  Procedures Procedures    Medications Ordered in ED Medications  ibuprofen (ADVIL) tablet 600 mg (has no administration in time range)  bacitracin ointment (has no administration in time range)    ED Course/ Medical Decision Making/ A&P                           Medical Decision Making 37 year old male known to this department presents with request for ibuprofen and bacitracin.  Vital signs normal on intake.  Cardiopulmonary exam is normal, abdominal seems benign.  Skin exam of the left neck is reassuring.  Patient with normal sensation and grip in the right hand, nonvisualization of the right proximal forearm where he is having his pain due to patient poor compliance with exam.   Remains calm and cooperative at this time.  Risk OTC drugs.   Ibuprofen bacitracin provided.  No further workup warranted in the ER at this time.  Clinical concern for emergent underlying etiology that warrant further ED workup or inpatient management is exceedingly low.  Cole Barrett voiced understanding of his medical evaluation and treatment plan. Each of their questions answered to their expressed satisfaction.  Return precautions were given.  Patient is well-appearing, stable, and was discharged in good condition.  This chart was dictated using voice recognition software, Dragon. Despite the best efforts of this provider to proofread and correct errors, errors may still occur which can change documentation meaning.   Final Clinical Impression(s) / ED Diagnoses Final diagnoses:  None    Rx / DC Orders ED Discharge Orders     None         Aura Dials 11/29/22 0520    Veryl Speak, MD 11/30/22 [phone]

## 2022-11-29 NOTE — ED Triage Notes (Signed)
Pt reports some "little ache on my arm and I need some medicine for my neck.  His neck appears to be healing very well.

## 2022-11-29 NOTE — Discharge Instructions (Signed)
You are seen in the ER today for your arm pain.  You may take Tylenol or ibuprofen over-the-counter for this discomfort.  Please continue to dress the wound in your left neck with bacitracin and keep it clean and dry.  Return to the ER with any severe symptoms.  Happy birthday!!

## 2022-11-30 DIAGNOSIS — Z760 Encounter for issue of repeat prescription: Secondary | ICD-10-CM | POA: Insufficient documentation

## 2022-11-30 DIAGNOSIS — M79632 Pain in left forearm: Secondary | ICD-10-CM | POA: Insufficient documentation

## 2022-12-01 ENCOUNTER — Encounter (HOSPITAL_COMMUNITY): Payer: Self-pay | Admitting: Emergency Medicine

## 2022-12-01 ENCOUNTER — Emergency Department (HOSPITAL_COMMUNITY)
Admission: EM | Admit: 2022-12-01 | Discharge: 2022-12-01 | Disposition: A | Discharge status: Home / Self Care | Payer: Self-pay | Attending: Emergency Medicine | Admitting: Emergency Medicine

## 2022-12-01 ENCOUNTER — Other Ambulatory Visit: Payer: Self-pay

## 2022-12-01 DIAGNOSIS — Z760 Encounter for issue of repeat prescription: Secondary | ICD-10-CM

## 2022-12-01 MED ORDER — BACITRACIN ZINC 500 UNIT/GM EX OINT
TOPICAL_OINTMENT | Freq: Two times a day (BID) | CUTANEOUS | Status: DC
  Administered 2022-12-01: 1 via TOPICAL
  Filled 2022-12-01: qty 2.7

## 2022-12-01 MED ORDER — IBUPROFEN 400 MG PO TABS
600.0000 mg | ORAL_TABLET | Freq: Once | ORAL | Status: AC
  Administered 2022-12-01: 600 mg via ORAL
  Filled 2022-12-01: qty 1

## 2022-12-01 NOTE — ED Provider Notes (Signed)
[facility] EMERGENCY DEPARTMENT Provider Note   CSN: [id number] Arrival date & time: 11/30/22  2359     History  Chief Complaint  Patient presents with   Medication Refill    Cole Barrett is a 37 y.o. male who presents with ongoing right arm soreness after injury at work few weeks ago.  States that he "tweaked it again today" while at work.  No numbness tingling or weakness in the hand.  Also requesting more bacitracin for his ongoing wound to his left neck.  Patient well-known to this provider with greater than 60 visits in the last 6 months.  Pleasant, cooperative, history of schizophrenia and homelessness.  HPI     Home Medications Prior to Admission medications   Medication Sig Start Date End Date Taking? Authorizing Provider  bacitracin ointment Apply 1 Application topically 2 (two) times daily. 10/13/22   Smoot, Leary Roca, PA-C  mupirocin cream (BACTROBAN) 2 % Apply 1 Application topically 2 (two) times daily. Patient not taking: Reported on 10/01/2022 06/22/22   Evlyn Courier, PA-C  naproxen (NAPROSYN) 375 MG tablet Take 1 tablet (375 mg total) by mouth 2 (two) times daily as needed for headache. 10/30/22   Kommor, [place], MD  risperiDONE (RISPERDAL) 1 MG tablet Take 1 tablet (1 mg total) by mouth at bedtime. Patient taking differently: Take 1 mg by mouth 2 (two) times daily. 01/18/22   Prosperi, Christian H, PA-C  risperiDONE (RISPERDAL) 2 MG tablet Take 1 tablet (2 mg total) by mouth 2 (two) times daily. 11/23/22   Cristie Hem, MD  amantadine (SYMMETREL) 100 MG capsule Take 1 capsule (100 mg total) by mouth 2 (two) times daily. Patient not taking: Reported on 11/04/2019 09/01/19 11/17/19  Mallie Darting, NP      Allergies    Patient has no known allergies.    Review of Systems   Review of Systems  Physical Exam Updated Vital Signs BP 132/74   Pulse 66   Temp 98 F (36.7 C) (Oral)   Resp 20   SpO2 98%  Physical Exam Vitals and  nursing note reviewed.  Constitutional:      Appearance: He is not ill-appearing or toxic-appearing.  HENT:     Head: Normocephalic and atraumatic.     Mouth/Throat:     Mouth: Mucous membranes are moist.     Pharynx: No oropharyngeal exudate or posterior oropharyngeal erythema.  Eyes:     General:        Right eye: No discharge.        Left eye: No discharge.     Conjunctiva/sclera: Conjunctivae normal.  Neck:   Cardiovascular:     Rate and Rhythm: Normal rate and regular rhythm.     Pulses: Normal pulses.     Heart sounds: Normal heart sounds. No murmur heard. Pulmonary:     Effort: Pulmonary effort is normal. No respiratory distress.     Breath sounds: Normal breath sounds. No wheezing or rales.  Abdominal:     General: Bowel sounds are normal. There is no distension.     Palpations: Abdomen is soft.     Tenderness: There is no abdominal tenderness.  Musculoskeletal:        General: No deformity.     Right forearm: Normal.     Left forearm: Tenderness present. No swelling, edema, deformity or bony tenderness.     Right wrist: Normal.     Left wrist: Normal.     Right hand:  Normal.     Left hand: Normal.     Cervical back: Neck supple.  Skin:    General: Skin is warm and dry.  Neurological:     Mental Status: He is alert. Mental status is at baseline.  Psychiatric:        Mood and Affect: Mood normal.     ED Results / Procedures / Treatments   Labs (all labs ordered are listed, but only abnormal results are displayed) Labs Reviewed - No data to display  EKG None  Radiology No results found.  Procedures Procedures  Medications Ordered in ED Medications  bacitracin ointment (1 Application Topical Given 12/01/22 0019)  ibuprofen (ADVIL) tablet 600 mg (600 mg Oral Given 12/01/22 0019)    ED Course/ Medical Decision Making/ A&P                           Medical Decision Making 37 year old male who presents with concern for request for ibuprofen and  bacitracin, ongoing right arm pain after injury at work.  Vital signs normal and intake.  Cardiopulmonary and abdominal exams benign.  Musculoskeletal exam unremarkable,  Risk OTC drugs.   Patient administered ibuprofen and bacitracin, no further complaints.  Well-appearing, normal hemodynamics, no further comport in the ER at this time.  This chart was dictated using voice recognition software, Dragon. Despite the best efforts of this provider to proofread and correct errors, errors may still occur which can change documentation meaning.   Final Clinical Impression(s) / ED Diagnoses Final diagnoses:  Encounter for medication refill    Rx / DC Orders ED Discharge Orders     None         Aura Dials 12/01/22 0039    Quintella Reichert, MD 12/01/22 [phone]

## 2022-12-01 NOTE — ED Triage Notes (Signed)
Pt states that he needs his pain meds and a cream for a rash on his arm.

## 2022-12-03 ENCOUNTER — Emergency Department (HOSPITAL_COMMUNITY)
Admission: EM | Admit: 2022-12-03 | Discharge: 2022-12-03 | Disposition: A | Discharge status: Home / Self Care | Payer: Self-pay | Attending: Emergency Medicine | Admitting: Emergency Medicine

## 2022-12-03 ENCOUNTER — Other Ambulatory Visit: Payer: Self-pay

## 2022-12-03 DIAGNOSIS — R443 Hallucinations, unspecified: Secondary | ICD-10-CM

## 2022-12-03 DIAGNOSIS — R440 Auditory hallucinations: Secondary | ICD-10-CM | POA: Insufficient documentation

## 2022-12-03 DIAGNOSIS — F17210 Nicotine dependence, cigarettes, uncomplicated: Secondary | ICD-10-CM | POA: Insufficient documentation

## 2022-12-03 LAB — BASIC METABOLIC PANEL
Anion gap: 11 (ref 5–15)
BUN: 11 mg/dL (ref 6–20)
CO2: 20 mmol/L — ABNORMAL LOW (ref 22–32)
Calcium: 8.3 mg/dL — ABNORMAL LOW (ref 8.9–10.3)
Chloride: 105 mmol/L (ref 98–111)
Creatinine, Ser: 0.81 mg/dL (ref 0.61–1.24)
GFR, Estimated: >60 mL/min (ref >60)
Glucose, Bld: 100 mg/dL — ABNORMAL HIGH (ref 70–99)
Potassium: 3.5 mmol/L (ref 3.5–5.1)
Sodium: 136 mmol/L (ref 135–145)

## 2022-12-03 LAB — CBC WITH DIFFERENTIAL/PLATELET
Abs Immature Granulocytes: 0.04 10*3/uL (ref 0.00–0.07)
Basophils Absolute: 0 10*3/uL (ref 0.0–0.1)
Basophils Relative: 1 %
Eosinophils Absolute: 0.2 10*3/uL (ref 0.0–0.5)
Eosinophils Relative: 4 %
HCT: 37.2 % — ABNORMAL LOW (ref 39.0–52.0)
Hemoglobin: 12 g/dL — ABNORMAL LOW (ref 13.0–17.0)
Immature Granulocytes: 1 %
Lymphocytes Relative: 41 %
Lymphs Abs: 1.9 10*3/uL (ref 0.7–4.0)
MCH: 27.5 pg (ref 26.0–34.0)
MCHC: 32.3 g/dL (ref 30.0–36.0)
MCV: 85.3 fL (ref 80.0–100.0)
Monocytes Absolute: 0.5 10*3/uL (ref 0.1–1.0)
Monocytes Relative: 12 %
Neutro Abs: 1.8 10*3/uL (ref 1.7–7.7)
Neutrophils Relative %: 41 %
Platelets: 282 10*3/uL (ref 150–400)
RBC: 4.36 MIL/uL (ref 4.22–5.81)
RDW: 13.4 % (ref 11.5–15.5)
WBC: 4.4 10*3/uL (ref 4.0–10.5)
nRBC: 0 % (ref 0.0–0.2)

## 2022-12-03 LAB — CBG MONITORING, ED: Glucose-Capillary: 99 mg/dL (ref 70–99)

## 2022-12-03 LAB — ETHANOL: Alcohol, Ethyl (B): 102 mg/dL — ABNORMAL HIGH (ref <10)

## 2022-12-03 MED ORDER — RISPERIDONE 1 MG PO TBDP: 1.0000 mg | ORAL_TABLET | Freq: Two times a day (BID) | ORAL | 0 refills | Status: DC

## 2022-12-03 MED ORDER — RISPERIDONE 1 MG PO TBDP
1.0000 mg | ORAL_TABLET | Freq: Two times a day (BID) | ORAL | Status: DC
  Administered 2022-12-03: 1 mg via ORAL
  Filled 2022-12-03 (×3): qty 1

## 2022-12-03 NOTE — ED Triage Notes (Signed)
Patient reports auditory hallucinations this morning .

## 2022-12-03 NOTE — ED Notes (Signed)
Patient aware he is moving to a different assignment, no new concerns at this time, is aware we need a urine specimen, was AOX4, and in stable condition.

## 2022-12-03 NOTE — ED Notes (Addendum)
Pt a and o x 4, calm and cooperative at present; pt verbalized understanding that a urine sample is needed; pt provided with urinal

## 2022-12-03 NOTE — ED Notes (Signed)
Lunch tray at bedside. ?

## 2022-12-03 NOTE — Consult Note (Signed)
[facility] ED ASSESSMENT   Reason for Consult:  hallucinations Referring Physician:  Yvonne Kendall, [place] Patient Identification: Cole Barrett MRN:  [id number] ED Chief Complaint: Auditory hallucinations  Diagnosis:  Principal Problem:   Auditory hallucinations   ED Assessment Time Calculation: Start Time: 1030 Stop Time: 1115 Total Time in Minutes (Assessment Completion): 45   HPI:   Cole Barrett is a 37 y.o. male patient who voluntarily presents to [facility] for auditory hallucinations. Pt is well known to [facility] system, he has had over 60 ED/UC visits in the last 6 months. It appears patient mostly presents for medical concerns and occasionally will present with auditory hallucinations. Historically, patient has responded well to Risperidone in the past. Risperidone 1 mg PO BID was initiated in hospital.   Subjective:   Patient seen at [facility] for face to face evaluation. Pt received Risperidone 1 mg around 1 hour prior to my assessment. Pt tells me he is feeling much better since being given medication. He stated "risperidone helps me a lot. My head feels better, its not hurting or cloudy anymore."  He denies current auditory or visual hallucinations.  He tells me he had gone around 2 to 3 weeks without auditory hallucinations, and randomly started hearing them yesterday.  He denies them being command in nature.  Denies any suicidal or homicidal ideations.  Denies history of suicide attempts.  Patient tells me he has a place to stay, denies being homeless.  He denies illicit substance use.  He does endorse drinking beer multiple times a week.  Denies daily alcohol consumption.  He denies wanting substance use treatment.  He reports normally no problems with sleep, however when his auditory hallucinations return he has difficulty sleeping.  No problems with appetite.  He denies having any family or friends to contact for collateral.  He tells me he has a job, does not disclose to me details  about what his job is.  Denies having disability.  Ultimately patient is able to contract for safety and is requesting prescription of Risperidone.   Pt is able to engage in coherent and logical conversation. His auditory hallucinations appeared to rapidly improve after Risperidone administration. He does not appear to be RTIS, no concerns for psychosis/mania at this time. His speech is normal in rate and tone. There are concerns for malingering and secondary gain, as mentioned previously patient has over 60+ ED/UC visits in the past few months. Pt has not given urine sample for toxicology report, however he is denying substance use. I still counseled patient on importance of discontinuing any substance or alcohol use, as they can exacerbate hallucinations. He appeared to understand and agree. Pt is able to contract for safety at this time, denies SI/HI/AVH. He requested a prescription of Risperidone, will send in 10 days of 2mg  BID. I instructed him to secure OP follow up, and can go to walk in hours at [facility] Information will be provided in AVS. Pt is psychiatrically cleared.   Past Psychiatric History:  Malingering, polysubstance abuse, hallucinations  Risk to Self or Others: Is the patient at risk to self? No Has the patient been a risk to self in the past 6 months? No Has the patient been a risk to self within the distant past? No Is the patient a risk to others? No Has the patient been a risk to others in the past 6 months? No Has the patient been a risk to others within the distant past? No  [place] Scale:  Flowsheet  [facility] ED from 12/03/2022 in [facility] ED from 12/01/2022 in [facility] ED from 11/29/2022 in [facility] No Risk No Risk No Risk       Past Medical History:  Past Medical History:  Diagnosis Date   COVID-19    Homelessness     Hypercholesterolemia    Mental disorder    Paranoid behavior ([place])    PE (pulmonary embolism)    Schizophrenia ([place])    No past surgical history on file. Family History:  Family History  Problem Relation Age of Onset   Hypertension Mother    Social History:  Social History   Substance and Sexual Activity  Alcohol Use Yes   Comment: BAC not available     Social History   Substance and Sexual Activity  Drug Use Yes   Types: Marijuana   Comment: Last used: 2 months ago UDS NA    Social History   Socioeconomic History   Marital status: Single    Spouse name: Not on file   Number of children: Not on file   Years of education: Not on file   Highest education level: Not on file  Occupational History   Occupation: Employed    Comment: [employer]  Tobacco Use   Smoking status: Every Day    Packs/day: 0.50    Types: Cigarettes   Smokeless tobacco: Never  Vaping Use   Vaping Use: Never used  Substance and Sexual Activity   Alcohol use: Yes    Comment: BAC not available   Drug use: Yes    Types: Marijuana    Comment: Last used: 2 months ago UDS NA   Sexual activity: Never  Other Topics Concern   Not on file  Social History Narrative   ** Merged History Encounter **    Pt lives alone in [place].  He is employed.  He stated that he is followed by [facility], but that he has not been there in a while.   Social Determinants of Health   Financial Resource Strain: Not on file  Food Insecurity: Not on file  Transportation Needs: Not on file  Physical Activity: Not on file  Stress: Not on file  Social Connections: Not on file    Allergies:  No Known Allergies  Labs:  Results for orders placed or performed during the hospital encounter of 12/03/22 (from the past 48 hour(s))  Basic metabolic panel     Status: Abnormal   Collection Time: 12/03/22  5:05 AM  Result Value Ref Range   Sodium 136 135 - 145 mmol/L   Potassium 3.5 3.5 - 5.1 mmol/L   Chloride 105 98 -  111 mmol/L   CO2 20 (L) 22 - 32 mmol/L   Glucose, Bld 100 (H) 70 - 99 mg/dL    Comment: Glucose reference range applies only to samples taken after fasting for at least 8 hours.   BUN 11 6 - 20 mg/dL   Creatinine, Ser 0.81 0.61 - 1.24 mg/dL   Calcium 8.3 (L) 8.9 - 10.3 mg/dL   GFR, Estimated >60 >60 mL/min    Comment: (NOTE) Calculated using the CKD-EPI Creatinine Equation (2021)    Anion gap 11 5 - 15    Comment: Performed at [facility] [address], [place], [place] [address]  Ethanol     Status: Abnormal   Collection Time: 12/03/22  5:05 AM  Result Value Ref Range   Alcohol, Ethyl (B) 102 (H) <10 mg/dL    Comment: (NOTE) Lowest detectable limit for serum alcohol is 10 mg/dL.  For medical purposes only. Performed at [facility], [address] [place], [place] [address]   CBC with Differential     Status: Abnormal   Collection Time: 12/03/22  5:05 AM  Result Value Ref Range   WBC 4.4 4.0 - 10.5 K/uL   RBC 4.36 4.22 - 5.81 MIL/uL   Hemoglobin 12.0 (L) 13.0 - 17.0 g/dL   HCT 02.5 (L) 42.7 - 06.2 %   MCV 85.3 80.0 - 100.0 fL   MCH 27.5 26.0 - 34.0 pg   MCHC 32.3 30.0 - 36.0 g/dL   RDW 37.6 28.3 - 15.1 %   Platelets 282 150 - 400 K/uL   nRBC 0.0 0.0 - 0.2 %   Neutrophils Relative % 41 %   Neutro Abs 1.8 1.7 - 7.7 K/uL   Lymphocytes Relative 41 %   Lymphs Abs 1.9 0.7 - 4.0 K/uL   Monocytes Relative 12 %   Monocytes Absolute 0.5 0.1 - 1.0 K/uL   Eosinophils Relative 4 %   Eosinophils Absolute 0.2 0.0 - 0.5 K/uL   Basophils Relative 1 %   Basophils Absolute 0.0 0.0 - 0.1 K/uL   Immature Granulocytes 1 %   Abs Immature Granulocytes 0.04 0.00 - 0.07 K/uL    Comment: Performed at [facility], [address], [place], [place] [address]  CBG monitoring, ED     Status: None   Collection Time: 12/03/22  5:07 AM  Result Value Ref Range   Glucose-Capillary 99 70 - 99 mg/dL    Comment: Glucose reference range applies only to samples taken after fasting  for at least 8 hours.    Current Facility-Administered Medications  Medication Dose Route Frequency Provider Last Rate Last Admin   risperiDONE (RISPERDAL M-TABS) disintegrating tablet 1 mg  1 mg Oral BID Eligha Bridegroom, NP   1 mg at 12/03/22 7371   Current Outpatient Medications  Medication Sig Dispense Refill   risperiDONE (RISPERDAL M-TABS) 1 MG disintegrating tablet Take 1 tablet (1 mg total) by mouth 2 (two) times daily for 10 days. 20 tablet 0   Facility-Administered Medications Ordered in Other Encounters  Medication Dose Route Frequency Provider Last Rate Last Admin   acetaminophen (TYLENOL) tablet 650 mg  650 mg Oral Q6H PRN [name], NP       alum & mag hydroxide-simeth (MAALOX/MYLANTA) 200-200-20 MG/5ML suspension 30 mL  30 mL Oral Q4H PRN Ophelia Shoulder E, NP       magnesium hydroxide (MILK OF MAGNESIA) suspension 30 mL  30 mL Oral Daily PRN [name], NP        Psychiatric Specialty Exam: Presentation  General Appearance:  Fairly Groomed  Eye Contact: Fair  Speech: Clear and Coherent  Speech Volume: Normal  Handedness: Right   Mood and Affect  Mood: Euthymic  Affect: Congruent   Thought Process  Thought Processes: Coherent  Descriptions of Associations:Intact  Orientation:Full (Time, Place and Person)  Thought Content:Logical; WDL  History of Schizophrenia/Schizoaffective disorder:Yes  Duration of Psychotic Symptoms:Greater than six months  Hallucinations:Hallucinations: None  Ideas of Reference:None  Suicidal Thoughts:Suicidal Thoughts: No  Homicidal Thoughts:Homicidal Thoughts: No   Sensorium  Memory: Immediate Fair; Recent Fair  Judgment: Fair  Insight: Fair   Chartered certified accountant: Fair  Attention Span: Fair  Recall: [employer]  of Knowledge: Fair  Language: Fair   Engineer, water Activity: Psychomotor Activity: Normal   Assets  Assets: Physical Health;  Resilience    Sleep  Sleep: Sleep: Fair   Physical Exam: Physical Exam Neurological:     Mental Status: He is alert and oriented to person, place, and time.  Psychiatric:        Attention and Perception: Attention normal.        Mood and Affect: Mood normal.        Speech: Speech normal.        Behavior: Behavior is cooperative.        Thought Content: Thought content normal.    Review of Systems  Psychiatric/Behavioral:  Positive for hallucinations and substance abuse.   All other systems reviewed and are negative.  Blood pressure 118/66, pulse 70, temperature 97.9 F (36.6 C), resp. rate 16, SpO2 98 %. There is no height or weight on file to calculate BMI.  Medical Decision Making: Pt case reviewed and discussed with Dr. Dwyane Dee. Pt does not meet criteria for IVC or inpatient psychiatric treatment. He is contracting for safety, and denies SI/HI/AVH. He was provided with 10 days worth of psychiatric medications, and given information on how to acquire OP follow up and where to present if in mental health crisis again. Will psychiatrically clear patient.   - AVS included mental health crisis resources, therapy, OP follow up, and substance abuse treatment.   Disposition:  psych cleared  [name], NP 12/03/2022 11:12 AM

## 2022-12-03 NOTE — ED Notes (Signed)
Patient eating breakfast, extra PO fluids provided.

## 2022-12-03 NOTE — ED Notes (Signed)
Patient awake and alert this morning, no s/s of distress, will continue to monitor.

## 2022-12-03 NOTE — ED Provider Notes (Signed)
[facility] EMERGENCY DEPARTMENT Provider Note   CSN: [id number] Arrival date & time: 12/03/22  0416     History  Chief Complaint  Patient presents with   Hallucinations    Cole Barrett is a 37 y.o. male. With past medical history of PE, schizophrenia and paranoid behavior, housing instability, alcohol use who presents to the emergency department with auditory hallucinations.  States that he has auditory hallucinations that have been going on for years now. States that last night they acutely worsened. He states "they're talking about a bunch of bull."  Denies command hallucinations. Denies visual hallucinations. Denies SI/HI. States that he is taking Risperdal and has been compliant on this medication. Denies alcohol or drug use.   HPI     Home Medications Prior to Admission medications   Medication Sig Start Date End Date Taking? Authorizing Provider  risperiDONE (RISPERDAL M-TABS) 1 MG disintegrating tablet Take 1 tablet (1 mg total) by mouth 2 (two) times daily for 10 days. 12/03/22 12/13/22  [name], NP  amantadine (SYMMETREL) 100 MG capsule Take 1 capsule (100 mg total) by mouth 2 (two) times daily. Patient not taking: Reported on 11/04/2019 09/01/19 11/17/19  Mallie Darting, NP      Allergies    Patient has no known allergies.    Review of Systems   Review of Systems  Psychiatric/Behavioral:  Positive for hallucinations.   All other systems reviewed and are negative.   Physical Exam Updated Vital Signs BP 119/71 (BP Location: Left Arm)   Pulse 91   Temp 98.3 F (36.8 C) (Oral)   Resp 12   SpO2 97%  Physical Exam Vitals and nursing note reviewed.  Constitutional:      General: He is not in acute distress.    Appearance: Normal appearance. He is obese. He is not ill-appearing or toxic-appearing.  HENT:     Head: Normocephalic and atraumatic.  Eyes:     General: No scleral icterus.    Extraocular Movements: Extraocular  movements intact.  Cardiovascular:     Rate and Rhythm: Normal rate and regular rhythm.     Pulses: Normal pulses.  Pulmonary:     Effort: Pulmonary effort is normal. No respiratory distress.     Breath sounds: Normal breath sounds.  Musculoskeletal:     Cervical back: Neck supple.  Skin:    General: Skin is warm and dry.     Capillary Refill: Capillary refill takes less than 2 seconds.     Findings: No rash.  Neurological:     General: No focal deficit present.     Mental Status: He is alert and oriented to person, place, and time.  Psychiatric:        Attention and Perception: He is inattentive. He perceives auditory hallucinations. He does not perceive visual hallucinations.        Mood and Affect: Affect is flat.        Speech: Speech normal.        Behavior: Behavior is slowed. Behavior is cooperative.        Thought Content: Thought content does not include homicidal or suicidal ideation.        Judgment: Judgment is impulsive.     ED Results / Procedures / Treatments   Labs (all labs ordered are listed, but only abnormal results are displayed) Labs Reviewed  BASIC METABOLIC PANEL - Abnormal; Notable for the following components:      Result Value   CO2 20 (*)  Glucose, Bld 100 (*)    Calcium 8.3 (*)    All other components within normal limits  ETHANOL - Abnormal; Notable for the following components:   Alcohol, Ethyl (B) 102 (*)    All other components within normal limits  CBC WITH DIFFERENTIAL/PLATELET - Abnormal; Notable for the following components:   Hemoglobin 12.0 (*)    HCT 37.2 (*)    All other components within normal limits  RAPID URINE DRUG SCREEN, HOSP PERFORMED  CBG MONITORING, ED    EKG None  Radiology No results found.  Procedures Procedures   Medications Ordered in ED Medications  risperiDONE (RISPERDAL M-TABS) disintegrating tablet 1 mg (1 mg Oral Given 12/03/22 8099)    ED Course/ Medical Decision Making/ A&P                            Medical Decision Making Initial Impression and Ddx 37 year old male who presents to the emergency department complaining of hallucinations.  He does complain that these are chronic but have ramped up over the past 48 hours.  He does not appear to be responding to internal stimuli on my exam.  He is otherwise nonseptic, nontoxic in appearance and hemodynamically stable.  There is no focal neurological deficits. Patient PMH that increases complexity of ED encounter: Schizophrenia  Interpretation of Diagnostics I independent reviewed and interpreted the labs as followed: BMP, CBC stable.  Alcohol is 102  - I independently visualized the following imaging with scope of interpretation limited to determining acute life threatening conditions related to emergency care: Not indicated  Patient Reassessment and Ultimate Disposition/Management Patient was medically cleared to be evaluated by TTS.  He presents with hallucinations that are chronic.  No command hallucinations.  He is not suicidal or homicidal.  He also does not appear to be responding to internal stimuli or having an appropriate affect at this time.  Patient was evaluated by TTS and psychiatrically cleared.  They provided him with psychiatric medications and given him follow-up in the outpatient setting.  The patient has been appropriately medically screened and/or stabilized in the ED. I have low suspicion for any other emergent medical condition which would require further screening, evaluation or treatment in the ED or require inpatient management. At time of discharge the patient is hemodynamically stable and in no acute distress. I have discussed work-up results and diagnosis with patient and answered all questions. Patient is agreeable with discharge plan. We discussed strict return precautions for returning to the emergency department and they verbalized understanding.     Patient management required discussion with the following  services or consulting groups:  Psychiatry/TTS  Complexity of Problems Addressed Chronic illness with exacerbation  Additional Data Reviewed and Analyzed Further history obtained from: Past medical history and medications listed in the EMR, Prior ED visit notes, and Care Everywhere  Patient Encounter Risk Assessment Prescriptions and SDOH impact on management  Final Clinical Impression(s) / ED Diagnoses Final diagnoses:  Hallucinations    Rx / DC Orders ED Discharge Orders          Ordered    risperiDONE (RISPERDAL M-TABS) 1 MG disintegrating tablet  2 times daily        12/03/22 1110              Cristopher Peru, PA-C 12/03/22 1319    Margarita Grizzle, MD 12/03/22 1627

## 2022-12-03 NOTE — Discharge Instructions (Signed)

## 2022-12-03 NOTE — ED Provider Triage Note (Signed)
Emergency Medicine Provider Triage Evaluation Note  Cole Barrett , a 37 y.o. male  was evaluated in triage.  Pt complains of auditory hallucinations, he has no other complaints, states he would like some his Risperdal.  Review of Systems  Positive:  hallucinations Negative: Chest, shortness of breath  Physical Exam  BP 118/66   Pulse 70   Temp 97.9 F (36.6 C)   Resp 16   SpO2 98%  Gen:   Awake, no distress   Resp:  Normal effort  MSK:   Moves extremities without difficulty  Other:  No facial asymmetry no difficulty with word finding fine two-step commands there is no unilateral weakness present.  Medical Decision Making  Medically screening exam initiated at 5:05 AM.  Appropriate orders placed.  Cole Barrett was informed that the remainder of the evaluation will be completed by another provider, this initial triage assessment does not replace that evaluation, and the importance of remaining in the ED until their evaluation is complete.  Somewhat somnolent on my exam, but there is no focal deficits, I suspect this is likely just from being tired as he is homeless and walks great distances, will obtain basic lab workup and continue to monitor.   Cole Fennel, PA-C 12/03/22 [phone]

## 2022-12-03 NOTE — ED Notes (Signed)
Patient verbalizes understanding of discharge instructions. Opportunity for questioning and answers were provided. Pt given bus pass and [place] sandwich. Pt discharged from ED.

## 2022-12-03 NOTE — ED Notes (Signed)
Pt resting with eyes closed; respirations spontaneous, even, unlabored 

## 2022-12-06 ENCOUNTER — Emergency Department (HOSPITAL_COMMUNITY)
Admission: EM | Admit: 2022-12-06 | Discharge: 2022-12-06 | Disposition: A | Discharge status: Home / Self Care | Payer: Self-pay | Attending: Emergency Medicine | Admitting: Emergency Medicine

## 2022-12-06 DIAGNOSIS — Z760 Encounter for issue of repeat prescription: Secondary | ICD-10-CM | POA: Insufficient documentation

## 2022-12-06 MED ORDER — RISPERIDONE 1 MG PO TABS
1.0000 mg | ORAL_TABLET | Freq: Once | ORAL | Status: AC
  Administered 2022-12-06: 1 mg via ORAL
  Filled 2022-12-06 (×2): qty 1

## 2022-12-06 MED ORDER — ACETAMINOPHEN 325 MG PO TABS
650.0000 mg | ORAL_TABLET | Freq: Once | ORAL | Status: AC
  Administered 2022-12-06: 650 mg via ORAL
  Filled 2022-12-06: qty 2

## 2022-12-06 NOTE — ED Triage Notes (Signed)
Patient requesting Risperidal medication .

## 2022-12-06 NOTE — ED Provider Notes (Signed)
[facility] EMERGENCY DEPARTMENT Provider Note   CSN: [id number] Arrival date & time: 12/06/22  0116     History  Chief Complaint  Patient presents with   Requesting Risperidal     Cole Barrett is a 37 y.o. male.  Patient here asking for his dose of respite on and Tylenol.  He is ready to go home.  He has been in the waiting room for several hours.  He admits to alcohol use last night but he is wanted to leave but wants something for pain and his dose of Risperdal.  The history is provided by the patient.       Home Medications Prior to Admission medications   Medication Sig Start Date End Date Taking? Authorizing Provider  risperiDONE (RISPERDAL M-TABS) 1 MG disintegrating tablet Take 1 tablet (1 mg total) by mouth 2 (two) times daily for 10 days. 12/03/22 12/13/22  [name], NP  amantadine (SYMMETREL) 100 MG capsule Take 1 capsule (100 mg total) by mouth 2 (two) times daily. Patient not taking: Reported on 11/04/2019 09/01/19 11/17/19  Mallie Darting, NP      Allergies    Patient has no known allergies.    Review of Systems   Review of Systems  Physical Exam Updated Vital Signs BP (!) 103/55   Pulse 86   Temp 98 F (36.7 C) (Oral)   Resp 16   SpO2 98%  Physical Exam Vitals and nursing note reviewed.  Constitutional:      General: He is not in acute distress.    Appearance: He is well-developed.  HENT:     Head: Normocephalic and atraumatic.  Eyes:     Conjunctiva/sclera: Conjunctivae normal.  Cardiovascular:     Rate and Rhythm: Normal rate and regular rhythm.     Heart sounds: No murmur heard. Pulmonary:     Effort: Pulmonary effort is normal. No respiratory distress.     Breath sounds: Normal breath sounds.  Abdominal:     Palpations: Abdomen is soft.     Tenderness: There is no abdominal tenderness.  Musculoskeletal:        General: No swelling.     Cervical back: Neck supple.  Skin:    General: Skin is warm and  dry.     Capillary Refill: Capillary refill takes less than 2 seconds.  Neurological:     Mental Status: He is alert.  Psychiatric:        Mood and Affect: Mood normal.     ED Results / Procedures / Treatments   Labs (all labs ordered are listed, but only abnormal results are displayed) Labs Reviewed - No data to display  EKG None  Radiology No results found.  Procedures Procedures    Medications Ordered in ED Medications  acetaminophen (TYLENOL) tablet 650 mg (has no administration in time range)  risperiDONE (RISPERDAL) tablet 1 mg (has no administration in time range)    ED Course/ Medical Decision Making/ A&P                           Medical Decision Making Risk OTC drugs. Prescription drug management.   Oshay Stranahan is here for medication refill.  Normal vitals.  No fever.  Looks like his primary care doctor has already sent in a prescription for his risperdal.  I have told him where this location was.  I gave him a dose here as well as Tylenol.  Overall he is well-appearing.  Discharged in good condition.  Overall suspect that this was a social visit.  This chart was dictated using voice recognition software.  Despite best efforts to proofread,  errors can occur which can change the documentation meaning.         Final Clinical Impression(s) / ED Diagnoses Final diagnoses:  Medication refill    Rx / DC Orders ED Discharge Orders     None         Virgina Norfolk, DO 12/06/22 0840

## 2022-12-06 NOTE — ED Provider Triage Note (Signed)
  Emergency Medicine Provider Triage Evaluation Note  MRN:  [id number]  Arrival date & time: 12/06/22    Medically screening exam initiated at 1:47 AM.   CC:   Requesting Risperidal    HPI:  Cole Barrett is a 37 y.o. year-old male presents to the ED with chief complaint of requesting Risperdal and pain medications.  States that he has been drinking today and seems intoxicated.  History provided by patient. ROS:  -As included in HPI PE:   Vitals:   12/06/22 0147  BP: (!) 103/55  Pulse: 86  Resp: 16  Temp: 98 F (36.7 C)  SpO2: 98%    Non-toxic appearing No respiratory distress  MDM:  Based on signs and symptoms, intoxication is highest on my differential.   Patient was informed that the remainder of the evaluation will be completed by another provider, this initial triage assessment does not replace that evaluation, and the importance of remaining in the ED until their evaluation is complete.    Montine Circle, PA-C 12/06/22 [phone]

## 2022-12-06 NOTE — Discharge Instructions (Signed)
Your primary doctor has sent a prescription of risperidone to [facility] on [address].

## 2022-12-07 ENCOUNTER — Other Ambulatory Visit: Payer: Self-pay

## 2022-12-07 ENCOUNTER — Encounter (HOSPITAL_COMMUNITY): Payer: Self-pay | Admitting: Emergency Medicine

## 2022-12-07 ENCOUNTER — Emergency Department (HOSPITAL_COMMUNITY)
Admission: EM | Admit: 2022-12-07 | Discharge: 2022-12-07 | Discharge status: Against Medical Advice | Payer: Self-pay | Attending: Emergency Medicine | Admitting: Emergency Medicine

## 2022-12-07 DIAGNOSIS — M79601 Pain in right arm: Secondary | ICD-10-CM | POA: Insufficient documentation

## 2022-12-07 DIAGNOSIS — Z5321 Procedure and treatment not carried out due to patient leaving prior to being seen by health care provider: Secondary | ICD-10-CM | POA: Insufficient documentation

## 2022-12-07 MED ORDER — ACETAMINOPHEN 500 MG PO TABS
1000.0000 mg | ORAL_TABLET | ORAL | Status: AC
  Administered 2022-12-07: 1000 mg via ORAL
  Filled 2022-12-07: qty 2

## 2022-12-07 MED ORDER — RISPERIDONE 1 MG PO TABS
1.0000 mg | ORAL_TABLET | Freq: Once | ORAL | Status: AC
  Administered 2022-12-07: 1 mg via ORAL
  Filled 2022-12-07: qty 1

## 2022-12-07 NOTE — Discharge Instructions (Addendum)
Take Tylenol and ibuprofen for pain I have written down the doses below.  Follow-up with your primary care doctor if you do not have a primary care doctor follow-up with the [facility] I given you the information.  Please use Tylenol or ibuprofen for pain.  You may use 600 mg ibuprofen every 6 hours or 1000 mg of Tylenol every 6 hours.  You may choose to alternate between the 2.  This would be most effective.  Not to exceed 4 g of Tylenol within 24 hours.  Not to exceed 3200 mg ibuprofen 24 hours.

## 2022-12-07 NOTE — ED Triage Notes (Signed)
Pt c/o right arm pain after running into door approx 1 week ago. Pt also requesting Risperdal medication.

## 2022-12-09 ENCOUNTER — Emergency Department (HOSPITAL_COMMUNITY)
Admission: EM | Admit: 2022-12-09 | Discharge: 2022-12-09 | Disposition: A | Discharge status: Home / Self Care | Payer: Self-pay | Attending: Emergency Medicine | Admitting: Emergency Medicine

## 2022-12-09 ENCOUNTER — Other Ambulatory Visit: Payer: Self-pay

## 2022-12-09 DIAGNOSIS — Z79899 Other long term (current) drug therapy: Secondary | ICD-10-CM

## 2022-12-09 DIAGNOSIS — Z760 Encounter for issue of repeat prescription: Secondary | ICD-10-CM | POA: Insufficient documentation

## 2022-12-09 MED ORDER — RISPERIDONE 1 MG PO TABS
1.0000 mg | ORAL_TABLET | Freq: Once | ORAL | Status: AC
  Administered 2022-12-09: 1 mg via ORAL
  Filled 2022-12-09: qty 1

## 2022-12-09 MED ORDER — ACETAMINOPHEN 325 MG PO TABS
650.0000 mg | ORAL_TABLET | Freq: Once | ORAL | Status: AC
  Administered 2022-12-09: 650 mg via ORAL
  Filled 2022-12-09: qty 2

## 2022-12-09 NOTE — ED Triage Notes (Signed)
Pt came into triage but refused to speak to RN or allow vitals.  Pt ambulated back to the waiting room.

## 2022-12-09 NOTE — ED Notes (Signed)
AVS reviewed with pt prior to discharge. Pt verbalizes understanding of teaching. Belongings with pt upon depart. Pt refused vital signs prior to discharge.

## 2022-12-09 NOTE — ED Notes (Signed)
Pt refusing to go back to talk with provider. Pt states "I'm fine, I'm just relaxing right now". Informed pt the provider is ready to see you. Pt stated again, this time shaking his head not and waving his hand, "Nah, I'm just relaxing right now". Charge notified.

## 2022-12-09 NOTE — ED Provider Notes (Signed)
[facility] EMERGENCY DEPARTMENT Provider Note   CSN: [id number] Arrival date & time: 12/09/22  0151     History  Chief Complaint  Patient presents with   Medication Refill    Cole Barrett is a 37 y.o. male.  The history is provided by the patient and medical records.  Medication Refill  37 y.o. M presenting to the ED requesting dose of risperdal and something for pain.  States his feet are aching from walking today.  He has taken his shoes off for the past little bit which is helping.  He denies SI/HI.  Patient well known to this facility with similar complaints, 62 visits in the past 6 months.  Home Medications Prior to Admission medications   Medication Sig Start Date End Date Taking? Authorizing Provider  risperiDONE (RISPERDAL M-TABS) 1 MG disintegrating tablet Take 1 tablet (1 mg total) by mouth 2 (two) times daily for 10 days. 12/03/22 12/13/22  [name], NP  amantadine (SYMMETREL) 100 MG capsule Take 1 capsule (100 mg total) by mouth 2 (two) times daily. Patient not taking: Reported on 11/04/2019 09/01/19 11/17/19  Mallie Darting, NP      Allergies    Patient has no known allergies.    Review of Systems   Review of Systems  Constitutional:        Medication needs  All other systems reviewed and are negative.   Physical Exam Updated Vital Signs:  REFUSED VITALS  Physical Exam Vitals and nursing note reviewed.  Constitutional:      Appearance: He is well-developed.     Comments: Appears at baseline  HENT:     Head: Normocephalic and atraumatic.  Eyes:     Conjunctiva/sclera: Conjunctivae normal.     Pupils: Pupils are equal, round, and reactive to light.  Cardiovascular:     Rate and Rhythm: Normal rate and regular rhythm.     Heart sounds: Normal heart sounds.  Pulmonary:     Effort: Pulmonary effort is normal.     Breath sounds: Normal breath sounds.  Abdominal:     General: Bowel sounds are normal.      Palpations: Abdomen is soft.  Musculoskeletal:        General: Normal range of motion.     Cervical back: Normal range of motion.  Skin:    General: Skin is warm and dry.  Neurological:     Mental Status: He is alert and oriented to person, place, and time.  Psychiatric:     Comments: Denies SI/HI     ED Results / Procedures / Treatments   Labs (all labs ordered are listed, but only abnormal results are displayed) Labs Reviewed - No data to display  EKG None  Radiology No results found.  Procedures Procedures    Medications Ordered in ED Medications  risperiDONE (RISPERDAL) tablet 1 mg (1 mg Oral Given 12/09/22 0456)  acetaminophen (TYLENOL) tablet 650 mg (650 mg Oral Given 12/09/22 0456)    ED Course/ Medical Decision Making/ A&P                           Medical Decision Making Risk OTC drugs. Prescription drug management.   37 year old male presenting to the ED requesting dose of Risperdal and something for foot pain.  He appears at his baseline.  He denies any SI/HI.  He is well-known to the ED for similar.  He was given meds as requested.  Stable for discharge.  Can return here for new concerns.  Final Clinical Impression(s) / ED Diagnoses Final diagnoses:  Medication management    Rx / DC Orders ED Discharge Orders     None         Larene Pickett, PA-C 12/09/22 0100    Fatima Blank, MD 12/09/22 [phone]

## 2022-12-10 ENCOUNTER — Emergency Department (HOSPITAL_COMMUNITY)
Admission: EM | Admit: 2022-12-10 | Discharge: 2022-12-10 | Disposition: A | Discharge status: Home / Self Care | Payer: Self-pay | Attending: Emergency Medicine | Admitting: Emergency Medicine

## 2022-12-10 ENCOUNTER — Other Ambulatory Visit: Payer: Self-pay

## 2022-12-10 ENCOUNTER — Encounter (HOSPITAL_COMMUNITY): Payer: Self-pay

## 2022-12-10 DIAGNOSIS — Z79899 Other long term (current) drug therapy: Secondary | ICD-10-CM

## 2022-12-10 DIAGNOSIS — Z760 Encounter for issue of repeat prescription: Secondary | ICD-10-CM | POA: Insufficient documentation

## 2022-12-10 MED ORDER — RISPERIDONE 1 MG PO TABS
1.0000 mg | ORAL_TABLET | Freq: Once | ORAL | Status: AC
  Administered 2022-12-10: 1 mg via ORAL
  Filled 2022-12-10: qty 1

## 2022-12-10 NOTE — ED Notes (Signed)
Calm, cooperative. No signs of distress.

## 2022-12-10 NOTE — ED Triage Notes (Signed)
Pt states he needs resperidole, because [place] and [place] get to it.

## 2022-12-10 NOTE — ED Provider Notes (Signed)
  [facility] EMERGENCY DEPARTMENT Provider Note   CSN: [id number] Arrival date & time: 12/10/22  5573     History  Chief Complaint  Patient presents with   pt wants medicine    Cole Barrett is a 37 y.o. male.  Patient in the emergency department this morning requesting a dose of risperidone.  Patient seen frequently for similar request.  Denies other complaints at this time.       Home Medications Prior to Admission medications   Medication Sig Start Date End Date Taking? Authorizing Provider  risperiDONE (RISPERDAL M-TABS) 1 MG disintegrating tablet Take 1 tablet (1 mg total) by mouth 2 (two) times daily for 10 days. 12/03/22 12/13/22  [name], NP  amantadine (SYMMETREL) 100 MG capsule Take 1 capsule (100 mg total) by mouth 2 (two) times daily. Patient not taking: Reported on 11/04/2019 09/01/19 11/17/19  Mallie Darting, NP      Allergies    Patient has no known allergies.    Review of Systems   Review of Systems  Physical Exam Updated Vital Signs BP 132/81 (BP Location: Right Arm)   Pulse 90   Temp 97.9 F (36.6 C)   Resp 16   SpO2 98%  Physical Exam Vitals and nursing note reviewed.  Constitutional:      Appearance: He is well-developed.  HENT:     Head: Normocephalic and atraumatic.  Eyes:     Conjunctiva/sclera: Conjunctivae normal.  Pulmonary:     Effort: No respiratory distress.  Musculoskeletal:     Cervical back: Normal range of motion and neck supple.  Skin:    General: Skin is warm and dry.  Neurological:     Mental Status: He is alert.     ED Results / Procedures / Treatments   Labs (all labs ordered are listed, but only abnormal results are displayed) Labs Reviewed - No data to display  EKG None  Radiology No results found.  Procedures Procedures    Medications Ordered in ED Medications  risperiDONE (RISPERDAL) tablet 1 mg (has no administration in time range)    ED Course/ Medical  Decision Making/ A&P   Patient seen and examined.  Reviewed previous ED visit.  History from patient as well.  Labs/EKG: None ordered  Imaging: None ordered  Medications/Fluids: Ordered: Risperdal 1 mg p.o.   Most recent vital signs reviewed and are as follows: BP 132/81 (BP Location: Right Arm)   Pulse 90   Temp 97.9 F (36.6 C)   Resp 16   SpO2 98%   Initial impression: Medication request  Return instructions discussed with patient: Return with other concerns  Follow-up instructions discussed with patient: Encourage follow-up with his PCP                            Medical Decision Making Risk Prescription drug management.   Patient here for Risperdal, appears stable.        Final Clinical Impression(s) / ED Diagnoses Final diagnoses:  Medication management    Rx / DC Orders ED Discharge Orders     None         Carlisle Cater, Hershal Coria 12/10/22 1027    Fransico Meadow, MD 12/10/22 [phone]

## 2022-12-13 DIAGNOSIS — Z5321 Procedure and treatment not carried out due to patient leaving prior to being seen by health care provider: Secondary | ICD-10-CM | POA: Insufficient documentation

## 2022-12-13 DIAGNOSIS — R5383 Other fatigue: Secondary | ICD-10-CM | POA: Insufficient documentation

## 2022-12-14 ENCOUNTER — Emergency Department (HOSPITAL_COMMUNITY)
Admission: EM | Admit: 2022-12-14 | Discharge: 2022-12-14 | Discharge status: Against Medical Advice | Payer: Self-pay | Attending: Emergency Medicine | Admitting: Emergency Medicine

## 2022-12-14 ENCOUNTER — Encounter (HOSPITAL_COMMUNITY): Payer: Self-pay | Admitting: Emergency Medicine

## 2022-12-14 ENCOUNTER — Other Ambulatory Visit: Payer: Self-pay

## 2022-12-14 NOTE — ED Notes (Signed)
Patient decided to leave.   

## 2022-12-14 NOTE — ED Triage Notes (Signed)
Pt asleep in the waiting room.  RN was able to get pt to wake up but he was too tired to speak to RN.

## 2022-12-15 ENCOUNTER — Other Ambulatory Visit: Payer: Self-pay

## 2022-12-15 ENCOUNTER — Encounter (HOSPITAL_COMMUNITY): Payer: Self-pay | Admitting: Emergency Medicine

## 2022-12-15 ENCOUNTER — Emergency Department (HOSPITAL_COMMUNITY)
Admission: EM | Admit: 2022-12-15 | Discharge: 2022-12-16 | Disposition: A | Discharge status: Home / Self Care | Payer: Self-pay | Attending: Emergency Medicine | Admitting: Emergency Medicine

## 2022-12-15 DIAGNOSIS — M79601 Pain in right arm: Secondary | ICD-10-CM | POA: Insufficient documentation

## 2022-12-15 NOTE — ED Triage Notes (Signed)
Pt requesting pain medication for right shoulder pain. Pt states he ran into a door a couple days ago.

## 2022-12-16 NOTE — ED Provider Notes (Signed)
  [facility] Emergency Department Provider Note MRN:  [id number]  Arrival date & time: 12/16/22     Chief Complaint   Shoulder Pain   History of Present Illness   Cole Barrett is a 37 y.o. year-old male presents to the ED with chief complaint of right arm pain.  States that he needs pain medicines because of something that he did at work.  Unable to specify a MOI.  Denies any treatments PTA.  Seen here frequently.  This is his 64th visit in the past 6 months.  History provided by patient.   Review of Systems  Pertinent positive and negative review of systems noted in HPI.    Physical Exam   Vitals:   12/15/22 2333  BP: 99/68  Pulse: 77  Resp: 17  Temp: 97.6 F (36.4 C)  SpO2: 96%    CONSTITUTIONAL:  non toxic-appearing, NAD NEURO:  Alert and oriented x 3, CN 3-12 grossly intact EYES:  eyes equal and reactive ENT/NECK:  Supple, no stridor  CARDIO:  normal rate, appears well-perfused  PULM:  No respiratory distress,  GI/GU:  non-distended,  MSK/SPINE:  No gross deformities, no edema, moves all extremities, ROM and strength of right arm is normal, no deformity, normal pronation, supination, flexion and extension SKIN:  no rash, atraumatic   *Additional and/or pertinent findings included in MDM below  Diagnostic and Interventional Summary    EKG Interpretation  Date/Time:    Ventricular Rate:    PR Interval:    QRS Duration:   QT Interval:    QTC Calculation:   R Axis:     Text Interpretation:         Labs Reviewed - No data to display  No orders to display    Medications - No data to display   Procedures  /  Critical Care Procedures  ED Course and Medical Decision Making  I have reviewed the triage vital signs, the nursing notes, and pertinent available records from the EMR.  Social Determinants Affecting Complexity of Care: Patient has no clinically significant social determinants affecting this chief  complaint..   ED Course:    Medical Decision Making    Consultants: No consultations were needed in caring for this patient.   Treatment and Plan: Emergency department workup does not suggest an emergent condition requiring admission or immediate intervention beyond  what has been performed at this time. The patient is safe for discharge and has  been instructed to return immediately for worsening symptoms, change in  symptoms or any other concerns    Final Clinical Impressions(s) / ED Diagnoses     ICD-10-CM   1. Pain of right upper extremity  M79.601       ED Discharge Orders     None         Discharge Instructions Discussed with and Provided to Patient:   Discharge Instructions   None      Montine Circle, PA-C 12/16/22 0023    Mesner, Corene Cornea, MD 12/16/22 [phone]

## 2022-12-18 ENCOUNTER — Emergency Department (HOSPITAL_COMMUNITY)
Admission: EM | Admit: 2022-12-18 | Discharge: 2022-12-18 | Disposition: A | Discharge status: Home / Self Care | Payer: Self-pay | Attending: Emergency Medicine | Admitting: Emergency Medicine

## 2022-12-18 ENCOUNTER — Other Ambulatory Visit: Payer: Self-pay

## 2022-12-18 DIAGNOSIS — Z760 Encounter for issue of repeat prescription: Secondary | ICD-10-CM | POA: Insufficient documentation

## 2022-12-18 DIAGNOSIS — M79601 Pain in right arm: Secondary | ICD-10-CM | POA: Insufficient documentation

## 2022-12-18 DIAGNOSIS — Z79899 Other long term (current) drug therapy: Secondary | ICD-10-CM

## 2022-12-18 MED ORDER — ACETAMINOPHEN 500 MG PO TABS
1000.0000 mg | ORAL_TABLET | Freq: Once | ORAL | Status: AC
  Administered 2022-12-18: 1000 mg via ORAL
  Filled 2022-12-18: qty 2

## 2022-12-18 MED ORDER — RISPERIDONE 1 MG PO TABS
1.0000 mg | ORAL_TABLET | Freq: Once | ORAL | Status: AC
  Administered 2022-12-18: 1 mg via ORAL
  Filled 2022-12-18: qty 1

## 2022-12-18 NOTE — ED Triage Notes (Signed)
Pt states he is here for get medications for right arm pain. No injury. Pt ambulatory in triage.

## 2022-12-18 NOTE — ED Provider Notes (Signed)
[facility] Provider Note   CSN: [id number] Arrival date & time: 12/18/22  0031     History  Chief Complaint  Patient presents with   Medication Refill    Cole Barrett is a 37 y.o. male.  The history is provided by the patient and medical records.   37 y.o. M presenting to the ED requesting medications.  He states he needs his nighttime Risperdal and something for pain in his right arm.  He denies any injury.  He appears at baseline.  Home Medications Prior to Admission medications   Medication Sig Start Date End Date Taking? Authorizing Provider  risperiDONE (RISPERDAL M-TABS) 1 MG disintegrating tablet Take 1 tablet (1 mg total) by mouth 2 (two) times daily for 10 days. 12/03/22 12/13/22  [name], NP  amantadine (SYMMETREL) 100 MG capsule Take 1 capsule (100 mg total) by mouth 2 (two) times daily. Patient not taking: Reported on 11/04/2019 09/01/19 11/17/19  Mallie Darting, NP      Allergies    Patient has no known allergies.    Review of Systems   Review of Systems  Constitutional:        Medication needs  All other systems reviewed and are negative.   Physical Exam Updated Vital Signs BP (!) 141/79   Pulse 72   Temp 98.1 F (36.7 C) (Oral)   Resp 18   SpO2 98%   Physical Exam Vitals and nursing note reviewed.  Constitutional:      Appearance: He is well-developed.  HENT:     Head: Normocephalic and atraumatic.  Eyes:     Conjunctiva/sclera: Conjunctivae normal.     Pupils: Pupils are equal, round, and reactive to light.  Cardiovascular:     Rate and Rhythm: Normal rate and regular rhythm.     Heart sounds: Normal heart sounds.  Pulmonary:     Effort: Pulmonary effort is normal.     Breath sounds: Normal breath sounds.  Abdominal:     General: Bowel sounds are normal.     Palpations: Abdomen is soft.  Musculoskeletal:        General: Normal range of motion.     Cervical back: Normal  range of motion.  Skin:    General: Skin is warm and dry.  Neurological:     Mental Status: He is alert and oriented to person, place, and time.     ED Results / Procedures / Treatments   Labs (all labs ordered are listed, but only abnormal results are displayed) Labs Reviewed - No data to display  EKG None  Radiology No results found.  Procedures Procedures    Medications Ordered in ED Medications  acetaminophen (TYLENOL) tablet 1,000 mg (1,000 mg Oral Given 12/18/22 0041)  risperiDONE (RISPERDAL) tablet 1 mg (1 mg Oral Given 12/18/22 0041)    ED Course/ Medical Decision Making/ A&P                             Medical Decision Making Risk OTC drugs. Prescription drug management.   37 year old male requesting dose of his Risperdal and pain medicine for his arm.  He denies any injury.  He appears at his baseline.  Given medications at his request, stable for discharge.  Final Clinical Impression(s) / ED Diagnoses Final diagnoses:  Medication management    Rx / DC Orders ED Discharge Orders     None  Larene Pickett, PA-C 12/18/22 0998    Maudie Flakes, MD 12/18/22 [phone]

## 2022-12-20 ENCOUNTER — Emergency Department (HOSPITAL_COMMUNITY)
Admission: EM | Admit: 2022-12-20 | Discharge: 2022-12-20 | Disposition: A | Discharge status: Home / Self Care | Payer: Self-pay | Attending: Student | Admitting: Student

## 2022-12-20 ENCOUNTER — Other Ambulatory Visit: Payer: Self-pay

## 2022-12-20 DIAGNOSIS — Z760 Encounter for issue of repeat prescription: Secondary | ICD-10-CM | POA: Insufficient documentation

## 2022-12-20 DIAGNOSIS — Z5900 Homelessness unspecified: Secondary | ICD-10-CM | POA: Insufficient documentation

## 2022-12-20 DIAGNOSIS — M79621 Pain in right upper arm: Secondary | ICD-10-CM | POA: Insufficient documentation

## 2022-12-20 DIAGNOSIS — M79601 Pain in right arm: Secondary | ICD-10-CM

## 2022-12-20 MED ORDER — RISPERIDONE 1 MG PO TABS
1.0000 mg | ORAL_TABLET | Freq: Once | ORAL | Status: AC
  Administered 2022-12-20: 1 mg via ORAL
  Filled 2022-12-20: qty 1

## 2022-12-20 MED ORDER — ACETAMINOPHEN 325 MG PO TABS
ORAL_TABLET | ORAL | Status: AC
  Administered 2022-12-20: 650 mg via ORAL
  Filled 2022-12-20: qty 2

## 2022-12-20 MED ORDER — ACETAMINOPHEN 500 MG PO TABS: 1000.0000 mg | ORAL_TABLET | Freq: Once | ORAL | Status: DC

## 2022-12-20 MED ORDER — ACETAMINOPHEN 325 MG PO TABS: 650.0000 mg | ORAL_TABLET | Freq: Once | ORAL | Status: AC

## 2022-12-20 NOTE — ED Triage Notes (Signed)
Pt returns for medications for ongoing arm pain and risperidone

## 2022-12-20 NOTE — Discharge Instructions (Addendum)
Arm pain-please use over-the-counter pain medication as needed.  Please follow-up with community health and wellness. Medication refill-please follow-up with behavioral health urgent care for further management of your medications. Homelessness-I have given you resources for shelters within the area please review

## 2022-12-20 NOTE — ED Provider Notes (Signed)
[facility] Provider Note   CSN: [id number] Arrival date & time: 12/20/22  0004     History  Chief Complaint  Patient presents with   Medication     Cole Barrett is a 37 y.o. male.  HPI   Medical history including homelessness, schizophrenia, homelessness presenting with complaints of medication refill as well as right arm pain.  Patient states that he needs his daily dose of Risperdal, states he is unable to obtain at home.  He also notes he has right arm pain, right arm pain started earlier today, no recent trauma, pain is just in his right arm does not radiate no paresthesia or weakness he has no other complaints.    Home Medications Prior to Admission medications   Medication Sig Start Date End Date Taking? Authorizing Provider  risperiDONE (RISPERDAL M-TABS) 1 MG disintegrating tablet Take 1 tablet (1 mg total) by mouth 2 (two) times daily for 10 days. 12/03/22 12/13/22  [name], NP  amantadine (SYMMETREL) 100 MG capsule Take 1 capsule (100 mg total) by mouth 2 (two) times daily. Patient not taking: Reported on 11/04/2019 09/01/19 11/17/19  Mallie Darting, NP      Allergies    Patient has no known allergies.    Review of Systems   Review of Systems  Constitutional:  Negative for chills and fever.  Respiratory:  Negative for shortness of breath.   Cardiovascular:  Negative for chest pain.  Gastrointestinal:  Negative for abdominal pain.  Musculoskeletal:        Right arm pain  Neurological:  Negative for headaches.    Physical Exam Updated Vital Signs BP 129/80 (BP Location: Left Arm)   Pulse 64   Temp 98 F (36.7 C)   Resp 18   SpO2 98%  Physical Exam Vitals and nursing note reviewed.  Constitutional:      General: He is not in acute distress.    Appearance: He is not ill-appearing.  HENT:     Head: Normocephalic and atraumatic.     Nose: No congestion.  Eyes:     Conjunctiva/sclera:  Conjunctivae normal.  Cardiovascular:     Rate and Rhythm: Normal rate and regular rhythm.     Pulses: Normal pulses.  Pulmonary:     Effort: Pulmonary effort is normal.  Musculoskeletal:     Comments: Focused exam of the right arm was benign, there is no deformities noted, he is moving at his fingers wrist elbow and shoulder without difficulty, he has some slight tenderness noted in the upper aspect of his right forearm, there is no crepitus for his note, there is no overlying skin changes, all compartments are soft, he has 2+ radial pulses, 2-second capillary refill.   Skin:    General: Skin is warm and dry.  Neurological:     Mental Status: He is alert.  Psychiatric:        Mood and Affect: Mood normal.     ED Results / Procedures / Treatments   Labs (all labs ordered are listed, but only abnormal results are displayed) Labs Reviewed - No data to display  EKG None  Radiology No results found.  Procedures Procedures    Medications Ordered in ED Medications  risperiDONE (RISPERDAL) tablet 1 mg (has no administration in time range)  acetaminophen (TYLENOL) tablet 1,000 mg (has no administration in time range)    ED Course/ Medical Decision Making/ A&P  Medical Decision Making  This patient presents to the ED for concern of arm pain, this involves an extensive number of treatment options, and is a complaint that carries with it a high risk of complications and morbidity.  The differential diagnosis includes fracture, dislocation, compartment syndrome    Additional history obtained:  Additional history obtained from N/A External records from outside source obtained and reviewed including previous ER notes   Co morbidities that complicate the patient evaluation  Psych disorder  Social Determinants of Health:  Homelessness    Lab Tests:  I Ordered, and personally interpreted labs.  The pertinent results include:  N/A   Imaging Studies ordered:  I ordered imaging studies including N/A I independently visualized and interpreted imaging which showed N/A I agree with the radiologist interpretation   Cardiac Monitoring:  The patient was maintained on a cardiac monitor.  I personally viewed and interpreted the cardiac monitored which showed an underlying rhythm of: N/A   Medicines ordered and prescription drug management:  I ordered medication including respiratory, Tylenol I have reviewed the patients home medicines and have made adjustments as needed  Critical Interventions:  N/A   Reevaluation:  With medication refill as well as right arm pain, will provide him with his risperidone, Tylenol, he is agreement discharge at this time.  Consultations Obtained:  N/a    Test Considered:  X-ray of the right arm-deferred my suspicion for fracture is very low at this time, there is no deformities noted, no traumatic injury, he is not increased risk for pathological fractures.    Rule out I have low suspicion for septic arthritis as patient denies IV drug use, skin exam was performed no erythematous, edematous, warm joints noted on exam, no new heart murmur heard on exam low suspicion for ligament or tendon damage as area was palpated no gross defects noted, they had full range of motion as well as 5/5 strength.  Low suspicion for compartment syndrome as area was palpated it was soft to the touch, neurovascular fully intact.     Dispostion and problem list  After consideration of the diagnostic results and the patients response to treatment, I feel that the patent would benefit from discharge.  Arm pain-suspect muscular, will recommend over-the-counter pain medications follow-up PCP as needed Medication refill-refer him to community health and wellness, as well as behavioral health care for further management of his medications.            Final Clinical Impression(s) / ED  Diagnoses Final diagnoses:  Medication refill  Pain of right upper extremity    Rx / DC Orders ED Discharge Orders     None         [name], PA-C 12/20/22 0047    Orpah Greek, MD 12/20/22 [phone]

## 2022-12-24 ENCOUNTER — Emergency Department (HOSPITAL_COMMUNITY)
Admission: EM | Admit: 2022-12-24 | Discharge: 2022-12-24 | Disposition: A | Discharge status: Home / Self Care | Payer: Self-pay | Attending: Emergency Medicine | Admitting: Emergency Medicine

## 2022-12-24 DIAGNOSIS — Z760 Encounter for issue of repeat prescription: Secondary | ICD-10-CM | POA: Insufficient documentation

## 2022-12-24 MED ORDER — RISPERIDONE 1 MG PO TBDP: 1.0000 mg | ORAL_TABLET | Freq: Two times a day (BID) | ORAL | 0 refills | Status: DC

## 2022-12-24 MED ORDER — IBUPROFEN 600 MG PO TABS: 600.0000 mg | ORAL_TABLET | Freq: Four times a day (QID) | ORAL | 0 refills | Status: DC | PRN

## 2022-12-24 NOTE — ED Provider Notes (Signed)
  [place] EMERGENCY DEPARTMENT AT [facility] Provider Note   CSN: [id number] Arrival date & time: 12/24/22  0555     History  Chief Complaint  Patient presents with   Medication Refill    Cole Barrett is a 37 y.o. male  here for medication. He wants tylenol and risperdal.  He has not other complaints.    Medication Refill      Home Medications Prior to Admission medications   Medication Sig Start Date End Date Taking? Authorizing Provider  risperiDONE (RISPERDAL M-TABS) 1 MG disintegrating tablet Take 1 tablet (1 mg total) by mouth 2 (two) times daily for 10 days. 12/03/22 12/13/22  [name], NP  amantadine (SYMMETREL) 100 MG capsule Take 1 capsule (100 mg total) by mouth 2 (two) times daily. Patient not taking: Reported on 11/04/2019 09/01/19 11/17/19  Mallie Darting, NP      Allergies    Patient has no known allergies.    Review of Systems   Review of Systems  Physical Exam Updated Vital Signs BP (!) 175/104 (BP Location: Right Arm)   Pulse 86   Temp 98.5 F (36.9 C) (Oral)   Resp 18   SpO2 100%  Physical Exam Vitals and nursing note reviewed.  Constitutional:      General: He is not in acute distress.    Appearance: He is well-developed. He is not diaphoretic.  HENT:     Head: Normocephalic and atraumatic.  Eyes:     General: No scleral icterus.    Conjunctiva/sclera: Conjunctivae normal.  Cardiovascular:     Rate and Rhythm: Normal rate and regular rhythm.     Heart sounds: Normal heart sounds.  Pulmonary:     Effort: Pulmonary effort is normal. No respiratory distress.     Breath sounds: Normal breath sounds.  Abdominal:     Palpations: Abdomen is soft.     Tenderness: There is no abdominal tenderness.  Musculoskeletal:     Cervical back: Normal range of motion and neck supple.  Skin:    General: Skin is warm and dry.  Neurological:     Mental Status: He is alert.  Psychiatric:        Behavior: Behavior normal.      ED Results / Procedures / Treatments   Labs (all labs ordered are listed, but only abnormal results are displayed) Labs Reviewed - No data to display  EKG None  Radiology No results found.  Procedures Procedures    Medications Ordered in ED Medications - No data to display  ED Course/ Medical Decision Making/ A&P                             Medical Decision Making   Per our previous discussion when I last saw him I made it clear that the ER is for emergencies, NOT to dispense medications. Since or Pharmacy is currently open. I gave him a written rx for ,motrin and rispredal.         Final Clinical Impression(s) / ED Diagnoses Final diagnoses:  None    Rx / DC Orders ED Discharge Orders     None         Margarita Mail, PA-C 12/24/22 1103    Gareth Morgan, MD 12/24/22 2314

## 2022-12-24 NOTE — Discharge Instructions (Signed)
the emergency department is NOT for dispensing medications but only for emergencies.

## 2022-12-24 NOTE — ED Triage Notes (Signed)
Patient here requesting risperidone med.

## 2022-12-25 DIAGNOSIS — Z760 Encounter for issue of repeat prescription: Secondary | ICD-10-CM | POA: Insufficient documentation

## 2022-12-26 ENCOUNTER — Encounter (HOSPITAL_COMMUNITY): Payer: Self-pay | Admitting: Emergency Medicine

## 2022-12-26 ENCOUNTER — Emergency Department (HOSPITAL_COMMUNITY)
Admission: EM | Admit: 2022-12-26 | Discharge: 2022-12-26 | Discharge status: Against Medical Advice | Payer: Self-pay | Attending: Emergency Medicine | Admitting: Emergency Medicine

## 2022-12-26 ENCOUNTER — Other Ambulatory Visit: Payer: Self-pay

## 2022-12-26 ENCOUNTER — Emergency Department (HOSPITAL_COMMUNITY)
Admission: EM | Admit: 2022-12-26 | Discharge: 2022-12-26 | Disposition: A | Discharge status: Home / Self Care | Payer: Self-pay | Attending: Emergency Medicine | Admitting: Emergency Medicine

## 2022-12-26 DIAGNOSIS — Z760 Encounter for issue of repeat prescription: Secondary | ICD-10-CM | POA: Insufficient documentation

## 2022-12-26 DIAGNOSIS — Z5321 Procedure and treatment not carried out due to patient leaving prior to being seen by health care provider: Secondary | ICD-10-CM | POA: Insufficient documentation

## 2022-12-26 MED ORDER — RISPERIDONE 1 MG PO TABS
1.0000 mg | ORAL_TABLET | Freq: Once | ORAL | Status: AC
  Administered 2022-12-26: 1 mg via ORAL
  Filled 2022-12-26: qty 1

## 2022-12-26 NOTE — ED Triage Notes (Signed)
Pt requesting risperdone.

## 2022-12-26 NOTE — ED Triage Notes (Signed)
Patient requesting Risperidone for his halucinations.

## 2022-12-26 NOTE — ED Provider Notes (Signed)
[place] Provider Note   CSN: [id number] Arrival date & time: 12/25/22  2357     History  Chief Complaint  Patient presents with   Patient requesting Psych medication/Hallucinations    Cole Barrett is a 37 y.o. male who is well-known to this department. Frequently presents with request for medication for Schizoaffective disorder, Risperdal. Presents with same request today. No acute complaints.    In addition to the above listed hx patient with hx of homelessness, polysubstance use.  HPI     Home Medications Prior to Admission medications   Medication Sig Start Date End Date Taking? Authorizing Provider  ibuprofen (ADVIL) 600 MG tablet Take 1 tablet (600 mg total) by mouth every 6 (six) hours as needed. 12/24/22   Harris, Vernie Shanks, PA-C  risperiDONE (RISPERDAL M-TABS) 1 MG disintegrating tablet Take 1 tablet (1 mg total) by mouth 2 (two) times daily for 10 days. 12/24/22 01/03/23  Cole Mail, PA-C  amantadine (SYMMETREL) 100 MG capsule Take 1 capsule (100 mg total) by mouth 2 (two) times daily. Patient not taking: Reported on 11/04/2019 09/01/19 11/17/19  Cole Darting, NP      Allergies    Patient has no known allergies.    Review of Systems   Review of Systems  All other systems reviewed and are negative.   Physical Exam Updated Vital Signs BP 124/76   Pulse 71   Temp 98.6 F (37 C)   Resp 16   SpO2 96%  Physical Exam Vitals and nursing note reviewed.  Constitutional:      Appearance: He is obese. He is not ill-appearing or toxic-appearing.  HENT:     Head: Normocephalic and atraumatic.     Mouth/Throat:     Mouth: Mucous membranes are moist.     Pharynx: No oropharyngeal exudate or posterior oropharyngeal erythema.  Eyes:     General:        Right eye: No discharge.        Left eye: No discharge.     Conjunctiva/sclera:     Right eye: Right conjunctiva is injected.     Left eye: Left  conjunctiva is injected.     Pupils: Pupils are equal, round, and reactive to light.  Neck:   Cardiovascular:     Rate and Rhythm: Normal rate and regular rhythm.     Pulses: Normal pulses.     Heart sounds: Normal heart sounds. No murmur heard. Pulmonary:     Effort: Pulmonary effort is normal. No respiratory distress.     Breath sounds: Normal breath sounds. No wheezing or rales.  Abdominal:     General: Bowel sounds are normal. There is no distension.     Palpations: Abdomen is soft.     Tenderness: There is no abdominal tenderness.  Musculoskeletal:        General: No deformity.     Cervical back: Neck supple.  Skin:    General: Skin is warm and dry.  Neurological:     Mental Status: He is alert. Mental status is at baseline.  Psychiatric:        Mood and Affect: Mood normal.     ED Results / Procedures / Treatments   Labs (all labs ordered are listed, but only abnormal results are displayed) Labs Reviewed - No data to display  EKG None  Radiology No results found.  Procedures Procedures    Medications Ordered in ED Medications  risperiDONE (RISPERDAL) tablet  1 mg (has no administration in time range)    ED Course/ Medical Decision Making/ A&P                             Medical Decision Making 37 year old male requesting his dose of risperdal.   VS normal on intake, cardiopulmonary exam is normal.    Medications offered, No further workup warranted in the ED at this time. Patient hemodynamically stable. Component of secondary gain to this patient's presentation, looking for warm place to sleep. Calm and cooperative, well-appearing at this time.   Cole Barrett voiced understanding of his medical evaluation and treatment plan. Each of their questions answered to their expressed satisfaction.  Return precautions were given.  Patient is well-appearing, stable, and was discharged in good condition.  This chart was dictated using voice recognition software,  Dragon. Despite the best efforts of this provider to proofread and correct errors, errors may still occur which can change documentation meaning.  Final Clinical Impression(s) / ED Diagnoses Final diagnoses:  None    Rx / DC Orders ED Discharge Orders     None         Aura Dials 12/26/22 0148    Fatima Blank, MD 12/26/22 [phone]

## 2022-12-26 NOTE — ED Notes (Signed)
Pt stated he was leaving to go to work.

## 2022-12-26 NOTE — ED Notes (Signed)
Pt refusing to be seen  

## 2022-12-27 ENCOUNTER — Other Ambulatory Visit: Payer: Self-pay

## 2022-12-27 ENCOUNTER — Emergency Department (HOSPITAL_COMMUNITY)
Admission: EM | Admit: 2022-12-27 | Discharge: 2022-12-27 | Disposition: A | Discharge status: Home / Self Care | Payer: Self-pay | Attending: Emergency Medicine | Admitting: Emergency Medicine

## 2022-12-27 DIAGNOSIS — Z760 Encounter for issue of repeat prescription: Secondary | ICD-10-CM | POA: Insufficient documentation

## 2022-12-27 MED ORDER — RISPERIDONE 1 MG PO TABS
1.0000 mg | ORAL_TABLET | Freq: Once | ORAL | Status: AC
  Administered 2022-12-27: 1 mg via ORAL
  Filled 2022-12-27: qty 1

## 2022-12-27 MED ORDER — ACETAMINOPHEN 325 MG PO TABS
650.0000 mg | ORAL_TABLET | Freq: Once | ORAL | Status: AC
  Administered 2022-12-27: 650 mg via ORAL
  Filled 2022-12-27: qty 2

## 2022-12-27 NOTE — Discharge Instructions (Signed)
As we discussed, it is inappropriate to use the emergency department for individual doses of your medication. To ensure that this issue does not continue, please be sure to keep your medications on your person at all times and follow-up with either your pcp or the wellness center for any additional refills you may need.  Return if development of any new or worsening symptoms.

## 2022-12-27 NOTE — ED Triage Notes (Signed)
Patient requesting Risperidone .

## 2022-12-27 NOTE — ED Provider Notes (Signed)
[facility] Provider Note   CSN: [id number] Arrival date & time: 12/27/22  0451     History  Chief Complaint  Patient presents with   Medication Refill    Cole Barrett is a 37 y.o. male.  Patient with history of schizoaffective disorder, homelessness, and polysubstance use presents today requesting a dose of his home medication risperidone.  He states that he has plenty refills at this time, however his medication is currently at his 'other house' that he is unable to get to until tomorrow.  He states he just got off work and is requesting a dose to hold him over until tomorrow.  He denies any current complaints.  Note, patient well-known to the department with 69 ED visits in the last 6 months.   The history is provided by the patient. No language interpreter was used.  Medication Refill      Home Medications Prior to Admission medications   Medication Sig Start Date End Date Taking? Authorizing Provider  ibuprofen (ADVIL) 600 MG tablet Take 1 tablet (600 mg total) by mouth every 6 (six) hours as needed. 12/24/22   Harris, Vernie Shanks, PA-C  risperiDONE (RISPERDAL M-TABS) 1 MG disintegrating tablet Take 1 tablet (1 mg total) by mouth 2 (two) times daily for 10 days. 12/24/22 01/03/23  Margarita Mail, PA-C  amantadine (SYMMETREL) 100 MG capsule Take 1 capsule (100 mg total) by mouth 2 (two) times daily. Patient not taking: Reported on 11/04/2019 09/01/19 11/17/19  Mallie Darting, NP      Allergies    Patient has no known allergies.    Review of Systems   Review of Systems  All other systems reviewed and are negative.   Physical Exam Updated Vital Signs BP 128/63 (BP Location: Right Arm)   Pulse 66   Resp 20   SpO2 100%  Physical Exam Vitals and nursing note reviewed.  Constitutional:      General: He is not in acute distress.    Appearance: Normal appearance. He is normal weight. He is not ill-appearing,  toxic-appearing or diaphoretic.  HENT:     Head: Normocephalic and atraumatic.  Cardiovascular:     Rate and Rhythm: Normal rate.  Pulmonary:     Effort: Pulmonary effort is normal. No respiratory distress.  Musculoskeletal:        General: Normal range of motion.     Cervical back: Normal range of motion.  Skin:    General: Skin is warm and dry.  Neurological:     General: No focal deficit present.     Mental Status: He is alert.  Psychiatric:        Mood and Affect: Mood normal.        Behavior: Behavior normal.     ED Results / Procedures / Treatments   Labs (all labs ordered are listed, but only abnormal results are displayed) Labs Reviewed - No data to display  EKG None  Radiology No results found.  Procedures Procedures    Medications Ordered in ED Medications  risperiDONE (RISPERDAL) tablet 1 mg (1 mg Oral Given 12/27/22 0535)  acetaminophen (TYLENOL) tablet 650 mg (650 mg Oral Given 12/27/22 0535)    ED Course/ Medical Decision Making/ A&P                             Medical Decision Making Risk OTC drugs. Prescription drug management.   Patient presents  today requesting his dose of risperdal as well as Tylenol.  He denies any complaints.  He is afebrile, nontoxic-appearing, and in no acute distress with reassuring vital signs.   Discussed extensively with the patient that repeat visits for medications is an inappropriate use of the emergency department. Offered prescription for refills, however patient states he has plenty of the medication, but doesn't have access until tomorrow. Patient given 1 time dose of medication per his request. No additional work-up indicated at this time, patient is stable for discharge.  Patient is understanding and amenable with plan, educated on red flag symptoms that would prompt immediate return.  Patient discharged in stable condition.   Final Clinical Impression(s) / ED Diagnoses Final diagnoses:  Medication refill     Rx / DC Orders ED Discharge Orders     None     An After Visit Summary was printed and given to the patient.     Nestor Lewandowsky 12/27/22 3500    Merryl Hacker, MD 01/03/23 [phone]

## 2022-12-28 ENCOUNTER — Emergency Department (HOSPITAL_COMMUNITY)
Admission: EM | Admit: 2022-12-28 | Discharge: 2022-12-28 | Disposition: A | Discharge status: Home / Self Care | Payer: Self-pay | Attending: Emergency Medicine | Admitting: Emergency Medicine

## 2022-12-28 ENCOUNTER — Emergency Department (HOSPITAL_COMMUNITY)
Admission: EM | Admit: 2022-12-28 | Discharge: 2022-12-29 | Disposition: A | Discharge status: Home / Self Care | Payer: Self-pay | Attending: Emergency Medicine | Admitting: Emergency Medicine

## 2022-12-28 ENCOUNTER — Encounter (HOSPITAL_COMMUNITY): Payer: Self-pay

## 2022-12-28 ENCOUNTER — Other Ambulatory Visit: Payer: Self-pay

## 2022-12-28 DIAGNOSIS — Z79899 Other long term (current) drug therapy: Secondary | ICD-10-CM

## 2022-12-28 DIAGNOSIS — M79601 Pain in right arm: Secondary | ICD-10-CM

## 2022-12-28 DIAGNOSIS — M79621 Pain in right upper arm: Secondary | ICD-10-CM | POA: Insufficient documentation

## 2022-12-28 MED ORDER — IBUPROFEN 800 MG PO TABS
800.0000 mg | ORAL_TABLET | Freq: Once | ORAL | Status: AC
  Administered 2022-12-28: 800 mg via ORAL
  Filled 2022-12-28: qty 1

## 2022-12-28 NOTE — Discharge Instructions (Signed)
You are seen here today for arm pain.  Take the prescribed ibuprofen that was sent to your pharmacy on 12/24/2022 for ongoing management of your pain.  Return to the ER with any new severe symptoms.

## 2022-12-28 NOTE — ED Provider Notes (Signed)
[place] AT [facility] Provider Note   CSN: [id number] Arrival date & time: 12/28/22  0006     History  Chief Complaint  Patient presents with   Arm Pain    Cole Barrett is a 37 y.o. male who is well-known to this facility with 61 visits in the last 6 months, history of homelessness, schizoaffective disorder and polysubstance use who presents today requesting medication for right arm soreness.  History of injury to this arm in the past, states he was lifting boxes today and "aggravated it".  No numbness or tingling in the hand per patient.  States he took his Risperdal today.  Requesting place to relax as well as it is cold outside and he is homeless.  HPI     Home Medications Prior to Admission medications   Medication Sig Start Date End Date Taking? Authorizing Provider  ibuprofen (ADVIL) 600 MG tablet Take 1 tablet (600 mg total) by mouth every 6 (six) hours as needed. 12/24/22   Harris, Vernie Shanks, PA-C  risperiDONE (RISPERDAL M-TABS) 1 MG disintegrating tablet Take 1 tablet (1 mg total) by mouth 2 (two) times daily for 10 days. 12/24/22 01/03/23  Margarita Mail, PA-C  amantadine (SYMMETREL) 100 MG capsule Take 1 capsule (100 mg total) by mouth 2 (two) times daily. Patient not taking: Reported on 11/04/2019 09/01/19 11/17/19  Mallie Darting, NP      Allergies    Patient has no known allergies.    Review of Systems   Review of Systems  Musculoskeletal:        Right arm pain    Physical Exam Updated Vital Signs BP 128/83   Pulse 95   Temp 97.8 F (36.6 C) (Oral)   Resp 18   Ht 5\' 10"  (1.778 m)   Wt 99.8 kg   SpO2 96%   BMI 31.57 kg/m  Physical Exam Vitals and nursing note reviewed.  Constitutional:      Appearance: He is obese. He is not ill-appearing or toxic-appearing.  HENT:     Head: Normocephalic and atraumatic.  Eyes:     General: No scleral icterus.       Right eye: No discharge.        Left eye: No  discharge.     Conjunctiva/sclera: Conjunctivae normal.  Cardiovascular:     Pulses:          Radial pulses are 2+ on the right side and 2+ on the left side.  Pulmonary:     Effort: Pulmonary effort is normal.  Musculoskeletal:     Comments: No erythema, induration, or skin changes to the right hand.  Patient unwilling to remove his clothing for evaluation of the proximal arm.  2+ radial pulses bilat  Skin:    General: Skin is warm and dry.  Neurological:     General: No focal deficit present.     Mental Status: He is alert.  Psychiatric:        Mood and Affect: Mood normal.     ED Results / Procedures / Treatments   Labs (all labs ordered are listed, but only abnormal results are displayed) Labs Reviewed - No data to display  EKG None  Radiology No results found.  Procedures Procedures    Medications Ordered in ED Medications  ibuprofen (ADVIL) tablet 800 mg (has no administration in time range)    ED Course/ Medical Decision Making/ A&P  Medical Decision Making 37 year old male well-known to this department for frequent ED visits requesting medication for arm soreness today  Medicines are normal and intake.  Cardiopulmonary exam is normal, abdominal exam is benign.  Neurovascular intact in the right upper extremity which is the site of his pain.  Risk Prescription drug management.   Suspect primary reason the patient ED visit is for secondary gain of warm place to sleep patient with multiple visits for same complaint of vague right arm pain secondary to lifting objects, neurovascularly intact, no evidence of infection on limited exam allowed by patient.  Ibuprofen offered as well as something to eat.  No further workup warranted in the ER at this time.  Patient states he has prescriptions for his medications just did not have them with him today after work.  Cole Barrett  voiced understanding of his medical evaluation and treatment  plan. Each of their questions answered to their expressed satisfaction.  Return precautions were given.  Patient is well-appearing, stable, and was discharged in good condition.  This chart was dictated using voice recognition software, Dragon. Despite the best efforts of this provider to proofread and correct errors, errors may still occur which can change documentation meaning.   Final Clinical Impression(s) / ED Diagnoses Final diagnoses:  Right arm pain    Rx / DC Orders ED Discharge Orders     None         Aura Dials 12/28/22 0141    Molpus, Jenny Reichmann, MD 12/28/22 [phone]

## 2022-12-28 NOTE — ED Triage Notes (Signed)
Pt complains of right arm pain x a couple of weeks. Pt states that he has been taking pain pills for it and needs some more.

## 2022-12-28 NOTE — ED Notes (Signed)
Called x4 and no response  

## 2022-12-28 NOTE — ED Notes (Signed)
Pt found in the lobby. Currently sleeping and had eaten [employer]. Pt awaken and asked if he wanted to be seen. Pt went back to sleep. Triage PA [facility] informed.

## 2022-12-29 MED ORDER — ACETAMINOPHEN 500 MG PO TABS
1000.0000 mg | ORAL_TABLET | Freq: Once | ORAL | Status: AC
  Administered 2022-12-29: 1000 mg via ORAL
  Filled 2022-12-29: qty 2

## 2022-12-29 MED ORDER — RISPERIDONE 1 MG PO TABS
1.0000 mg | ORAL_TABLET | Freq: Once | ORAL | Status: AC
  Administered 2022-12-29: 1 mg via ORAL
  Filled 2022-12-29: qty 1

## 2022-12-29 NOTE — ED Triage Notes (Signed)
Pt c/o ongoing right shoulder pain. Requesting medications.

## 2022-12-29 NOTE — ED Provider Notes (Signed)
Cole Barrett     History  Chief Complaint  Patient presents with   Medication    Cole Barrett is a 37 y.o. male.  The history is provided by the patient and medical records.   37 y.o. M presenting to the ED with right arm pain.  Well known to the ED for same.  Also requesting dose of his risperdal.  Denies SI/HI.  Of note, initially checked in over 1 hour ago.  He refused to come to triage as he was eating at [employer].  Home Medications Prior to Admission medications   Medication Sig Start Date End Date Taking? Authorizing Provider  ibuprofen (ADVIL) 600 MG tablet Take 1 tablet (600 mg total) by mouth every 6 (six) hours as needed. 12/24/22   Harris, Vernie Shanks, PA-C  risperiDONE (RISPERDAL M-TABS) 1 MG disintegrating tablet Take 1 tablet (1 mg total) by mouth 2 (two) times daily for 10 days. 12/24/22 01/03/23  Margarita Mail, PA-C  amantadine (SYMMETREL) 100 MG capsule Take 1 capsule (100 mg total) by mouth 2 (two) times daily. Patient not taking: Reported on 11/04/2019 09/01/19 11/17/19  Mallie Darting, NP      Allergies    Patient has no known allergies.    Review of Systems   Review of Systems  Musculoskeletal:  Positive for arthralgias.  All other systems reviewed and are negative.   Physical Exam Updated Vital Signs BP 122/71   Pulse 75   Temp 98 F (36.7 C)   Resp 18   SpO2 96%  Physical Exam Vitals and nursing note reviewed.  Constitutional:      Appearance: He is well-developed.     Comments: Sleeping throughout majority of triage exam, appears at baseline  HENT:     Head: Normocephalic and atraumatic.  Eyes:     Conjunctiva/sclera: Conjunctivae normal.     Pupils: Pupils are equal, round, and reactive to light.  Cardiovascular:     Rate and Rhythm: Normal rate and regular rhythm.     Heart sounds: Normal heart sounds.   Pulmonary:     Effort: Pulmonary effort is normal.     Breath sounds: Normal breath sounds.  Abdominal:     General: Bowel sounds are normal.     Palpations: Abdomen is soft.  Musculoskeletal:        General: Normal range of motion.     Cervical back: Normal range of motion.  Skin:    General: Skin is warm and dry.  Neurological:     Mental Status: He is oriented to person, place, and time.     ED Results / Procedures / Treatments   Labs (all labs ordered are listed, but only abnormal results are displayed) Labs Reviewed - No data to display  EKG None  Radiology No results found.  Procedures Procedures    Medications Ordered in ED Medications  risperiDONE (RISPERDAL) tablet 1 mg (has no administration in time range)  acetaminophen (TYLENOL) tablet 1,000 mg (has no administration in time range)    ED Course/ Medical Decision Making/ A&P                             Medical Decision Making Risk OTC drugs. Prescription drug management.   37 y.o. M here with right arm pain.  Also requesting dose of his risperdal.  Seen almost nightly for same.  Appears at his baseline.  Given tylenol and risperdal.  Stable for discharge.  Final Clinical Impression(s) / ED Diagnoses Final diagnoses:  Pain of right upper extremity  Medication management    Rx / DC Orders ED Discharge Orders     None         Larene Pickett, PA-C 12/29/22 0026    Orpah Greek, MD 12/29/22 [phone]

## 2023-01-01 ENCOUNTER — Emergency Department (HOSPITAL_COMMUNITY)
Admission: EM | Admit: 2023-01-01 | Discharge: 2023-01-02 | Disposition: A | Discharge status: Home / Self Care | Payer: Self-pay | Attending: Emergency Medicine | Admitting: Emergency Medicine

## 2023-01-01 ENCOUNTER — Emergency Department (HOSPITAL_COMMUNITY)
Admission: EM | Admit: 2023-01-01 | Discharge: 2023-01-01 | Disposition: A | Discharge status: Home / Self Care | Payer: Self-pay | Attending: Emergency Medicine | Admitting: Emergency Medicine

## 2023-01-01 ENCOUNTER — Other Ambulatory Visit: Payer: Self-pay

## 2023-01-01 ENCOUNTER — Encounter (HOSPITAL_COMMUNITY): Payer: Self-pay | Admitting: Emergency Medicine

## 2023-01-01 DIAGNOSIS — X500XXA Overexertion from strenuous movement or load, initial encounter: Secondary | ICD-10-CM | POA: Insufficient documentation

## 2023-01-01 DIAGNOSIS — Z760 Encounter for issue of repeat prescription: Secondary | ICD-10-CM | POA: Insufficient documentation

## 2023-01-01 DIAGNOSIS — S46211A Strain of muscle, fascia and tendon of other parts of biceps, right arm, initial encounter: Secondary | ICD-10-CM

## 2023-01-01 DIAGNOSIS — Y990 Civilian activity done for income or pay: Secondary | ICD-10-CM | POA: Insufficient documentation

## 2023-01-01 MED ORDER — IBUPROFEN 800 MG PO TABS
800.0000 mg | ORAL_TABLET | Freq: Once | ORAL | Status: AC
  Administered 2023-01-01: 800 mg via ORAL
  Filled 2023-01-01: qty 1

## 2023-01-01 MED ORDER — RISPERIDONE 1 MG PO TABS
1.0000 mg | ORAL_TABLET | Freq: Once | ORAL | Status: AC
  Administered 2023-01-02: 1 mg via ORAL
  Filled 2023-01-01: qty 1

## 2023-01-01 NOTE — ED Triage Notes (Signed)
Pt reports he is here to take some pain pills. No signs of distress or other complaints

## 2023-01-01 NOTE — ED Triage Notes (Signed)
Pt requesting pain medication for ongoing R shoulder pain.

## 2023-01-01 NOTE — Discharge Instructions (Signed)
Limited use of sling, remove arm periodically throughout the day for gentle range of motion exercises.  Can take Motrin as needed as directed.  Recommend recheck with your Worker's [employer]. provider in 2 days.

## 2023-01-01 NOTE — ED Provider Notes (Signed)
[facility] Provider Note   CSN: [id number] Arrival date & time: 01/01/23  0121     History  Chief Complaint  Patient presents with   Pain    Cole Barrett is a 37 y.o. male.  37 year old male presents with complaint of pain in the right upper arm.  States that he was lifting a bunch of boxes at work today when he felt a pull in his bicep.  Pain is worse with movement of the arm.  Denies falls or injuries.  No prior shoulder problems.  No other complaints or concerns.       Home Medications Prior to Admission medications   Medication Sig Start Date End Date Taking? Authorizing Provider  ibuprofen (ADVIL) 600 MG tablet Take 1 tablet (600 mg total) by mouth every 6 (six) hours as needed. 12/24/22   Harris, Vernie Shanks, PA-C  risperiDONE (RISPERDAL M-TABS) 1 MG disintegrating tablet Take 1 tablet (1 mg total) by mouth 2 (two) times daily for 10 days. 12/24/22 01/03/23  Margarita Mail, PA-C  amantadine (SYMMETREL) 100 MG capsule Take 1 capsule (100 mg total) by mouth 2 (two) times daily. Patient not taking: Reported on 11/04/2019 09/01/19 11/17/19  Mallie Darting, NP      Allergies    Patient has no known allergies.    Review of Systems   Review of Systems Negative except as per HPI Physical Exam Updated Vital Signs BP 133/82 (BP Location: Right Arm)   Pulse 70   Temp 98.6 F (37 C) (Oral)   Resp 16   SpO2 97%  Physical Exam Vitals and nursing note reviewed.  Constitutional:      General: He is not in acute distress.    Appearance: He is well-developed. He is not diaphoretic.  HENT:     Head: Normocephalic and atraumatic.  Cardiovascular:     Pulses: Normal pulses.  Pulmonary:     Effort: Pulmonary effort is normal.  Musculoskeletal:        General: Tenderness present. No swelling or deformity. Normal range of motion.     Right shoulder: Normal.     Right wrist: Normal.     Comments: Tenderness over the right  biceps area without palpable defect.  Has normal range of motion of the right elbow and right shoulder.  Sensation intact, strong radial pulse present.  Skin:    General: Skin is warm and dry.     Findings: No erythema or rash.  Neurological:     Mental Status: He is alert and oriented to person, place, and time.  Psychiatric:        Behavior: Behavior normal.     ED Results / Procedures / Treatments   Labs (all labs ordered are listed, but only abnormal results are displayed) Labs Reviewed - No data to display  EKG None  Radiology No results found.  Procedures Procedures    Medications Ordered in ED Medications  ibuprofen (ADVIL) tablet 800 mg (800 mg Oral Given 01/01/23 0145)    ED Course/ Medical Decision Making/ A&P                             Medical Decision Making  37 year old male presents with complaint of pain in his right biceps area after lifting heavy boxes at work today.  He has good strength in the arm, normal range of motion of the shoulder and elbow without bony tenderness.  Skin intact, radial pulse present, sensation intact.  Patient is provided with Motrin and an arm sling and referred to orthopedics for follow-up should problem persist.  Suspect bicep strain.        Final Clinical Impression(s) / ED Diagnoses Final diagnoses:  Strain of right biceps, initial encounter    Rx / DC Orders ED Discharge Orders     None         Tacy Learn, PA-C 01/01/23 0147    Fatima Blank, MD 01/01/23 [phone]

## 2023-01-02 NOTE — ED Provider Notes (Signed)
[facility] Provider Note   CSN: [id number] Arrival date & time: 01/01/23  2243     History  Chief Complaint  Patient presents with   Medication Refill    Cole Barrett is a 37 y.o. male.  The history is provided by the patient.  Medication Refill Medications/supplies requested:  Risperdal Reason for request:  Medications ran out Medications taken before: yes - see home medications   Patient has complete original prescription information: yes        Home Medications Prior to Admission medications   Medication Sig Start Date End Date Taking? Authorizing Provider  ibuprofen (ADVIL) 600 MG tablet Take 1 tablet (600 mg total) by mouth every 6 (six) hours as needed. 12/24/22   Harris, Vernie Shanks, PA-C  risperiDONE (RISPERDAL M-TABS) 1 MG disintegrating tablet Take 1 tablet (1 mg total) by mouth 2 (two) times daily for 10 days. 12/24/22 01/03/23  Margarita Mail, PA-C  amantadine (SYMMETREL) 100 MG capsule Take 1 capsule (100 mg total) by mouth 2 (two) times daily. Patient not taking: Reported on 11/04/2019 09/01/19 11/17/19  Mallie Darting, NP      Allergies    Patient has no known allergies.    Review of Systems   Review of Systems  Constitutional:  Negative for fever.  HENT:  Negative for facial swelling.   Eyes:  Negative for redness.  Respiratory:  Negative for wheezing and stridor.   Gastrointestinal:  Negative for vomiting.  All other systems reviewed and are negative.   Physical Exam Updated Vital Signs BP (!) 158/92 (BP Location: Right Arm)   Pulse 72   Temp 98.6 F (37 C) (Oral)   Ht 5\' 10"  (1.778 m)   Wt 99.8 kg   SpO2 100%   BMI 31.57 kg/m  Physical Exam Vitals and nursing note reviewed.  Constitutional:      General: He is not in acute distress.    Appearance: He is well-developed. He is not diaphoretic.  HENT:     Head: Normocephalic and atraumatic.     Nose: Nose normal.  Eyes:      Conjunctiva/sclera: Conjunctivae normal.     Pupils: Pupils are equal, round, and reactive to light.  Cardiovascular:     Rate and Rhythm: Normal rate and regular rhythm.     Pulses: Normal pulses.     Heart sounds: Normal heart sounds.  Pulmonary:     Effort: Pulmonary effort is normal.     Breath sounds: Normal breath sounds. No wheezing or rales.  Abdominal:     General: Bowel sounds are normal.     Palpations: Abdomen is soft.     Tenderness: There is no abdominal tenderness. There is no guarding or rebound.  Musculoskeletal:        General: Normal range of motion.     Cervical back: Normal range of motion and neck supple.  Skin:    General: Skin is warm and dry.     Capillary Refill: Capillary refill takes less than 2 seconds.  Neurological:     General: No focal deficit present.     Mental Status: He is alert and oriented to person, place, and time.     Deep Tendon Reflexes: Reflexes normal.  Psychiatric:        Mood and Affect: Mood normal.        Behavior: Behavior normal.     ED Results / Procedures / Treatments   Labs (all labs  ordered are listed, but only abnormal results are displayed) Labs Reviewed - No data to display  EKG None  Radiology No results found.  Procedures Procedures    Medications Ordered in ED Medications  risperiDONE (RISPERDAL) tablet 1 mg (has no administration in time range)    ED Course/ Medical Decision Making/ A&P                             Medical Decision Making Patient here for a dose of his risperdal   Amount and/or Complexity of Data Reviewed External Data Reviewed: notes.    Details: Previous notes reviewed   Risk Prescription drug management. Risk Details: Well appearing.  No other complaints.  Stable for discharge.  Strict return.      Final Clinical Impression(s) / ED Diagnoses Final diagnoses:  Encounter for medication refill   Return for intractable cough, coughing up blood, fevers > 100.4 unrelieved  by medication, shortness of breath, intractable vomiting, chest pain, shortness of breath, weakness, numbness, changes in speech, facial asymmetry, abdominal pain, passing out, Inability to tolerate liquids or food, cough, altered mental status or any concerns. No signs of systemic illness or infection. The patient is nontoxic-appearing on exam and vital signs are within normal limits.  I have reviewed the triage vital signs and the nursing notes. Pertinent labs & imaging results that were available during my care of the patient were reviewed by me and considered in my medical decision making (see chart for details). After history, exam, and medical workup I feel the patient has been appropriately medically screened and is safe for discharge home. Pertinent diagnoses were discussed with the patient. Patient was given return precautions.   Rx / DC Orders ED Discharge Orders     None         Analissa Bayless, MD 01/02/23 1937

## 2023-01-03 ENCOUNTER — Emergency Department (HOSPITAL_COMMUNITY)
Admission: EM | Admit: 2023-01-03 | Discharge: 2023-01-03 | Disposition: A | Discharge status: Home / Self Care | Payer: Self-pay | Attending: Emergency Medicine | Admitting: Emergency Medicine

## 2023-01-03 ENCOUNTER — Emergency Department (HOSPITAL_COMMUNITY)
Admission: EM | Admit: 2023-01-03 | Discharge: 2023-01-04 | Disposition: A | Discharge status: Home / Self Care | Payer: Self-pay | Attending: Student | Admitting: Student

## 2023-01-03 ENCOUNTER — Other Ambulatory Visit: Payer: Self-pay

## 2023-01-03 DIAGNOSIS — M79601 Pain in right arm: Secondary | ICD-10-CM | POA: Insufficient documentation

## 2023-01-03 DIAGNOSIS — Z5900 Homelessness unspecified: Secondary | ICD-10-CM | POA: Insufficient documentation

## 2023-01-03 DIAGNOSIS — Z760 Encounter for issue of repeat prescription: Secondary | ICD-10-CM | POA: Insufficient documentation

## 2023-01-03 DIAGNOSIS — M25511 Pain in right shoulder: Secondary | ICD-10-CM | POA: Insufficient documentation

## 2023-01-03 DIAGNOSIS — Z765 Malingerer [conscious simulation]: Secondary | ICD-10-CM | POA: Insufficient documentation

## 2023-01-03 DIAGNOSIS — G8929 Other chronic pain: Secondary | ICD-10-CM | POA: Insufficient documentation

## 2023-01-03 MED ORDER — IBUPROFEN 400 MG PO TABS
600.0000 mg | ORAL_TABLET | Freq: Once | ORAL | Status: AC
  Administered 2023-01-03: 600 mg via ORAL
  Filled 2023-01-03: qty 1

## 2023-01-03 MED ORDER — ACETAMINOPHEN 325 MG PO TABS
650.0000 mg | ORAL_TABLET | Freq: Once | ORAL | Status: AC | PRN
  Administered 2023-01-03: 650 mg via ORAL
  Filled 2023-01-03: qty 2

## 2023-01-03 NOTE — ED Triage Notes (Signed)
Patient reports right shoulder pain today . Denies injury.

## 2023-01-03 NOTE — ED Triage Notes (Signed)
Pt returned to ED for pain medication for his right shoulder pain.

## 2023-01-03 NOTE — Discharge Instructions (Signed)
You were given a dose of tylenol for your arm pain.

## 2023-01-03 NOTE — ED Provider Notes (Signed)
[facility] Provider Note   CSN: [id number] Arrival date & time: 01/03/23  2313     History  Chief Complaint  Patient presents with   Arm Pain     Cole Barrett is a 37 y.o. male who presents with concern for right arm pain, chronic for this patient as well as request to be allowed to sleep in the ED tonight. No other symptoms, patient unwilling to have much conversation with this provider. He is well-know to this department with >70 visits in the last 6 months. Patient with hx of schizophrenia, homelessness. Frequent ED visits for secondary gain.   HPI     Home Medications Prior to Admission medications   Medication Sig Start Date End Date Taking? Authorizing Provider  ibuprofen (ADVIL) 600 MG tablet Take 1 tablet (600 mg total) by mouth every 6 (six) hours as needed. 12/24/22   Harris, Vernie Shanks, PA-C  risperiDONE (RISPERDAL M-TABS) 1 MG disintegrating tablet Take 1 tablet (1 mg total) by mouth 2 (two) times daily for 10 days. 12/24/22 01/03/23  Margarita Mail, PA-C  amantadine (SYMMETREL) 100 MG capsule Take 1 capsule (100 mg total) by mouth 2 (two) times daily. Patient not taking: Reported on 11/04/2019 09/01/19 11/17/19  Mallie Darting, NP      Allergies    Patient has no known allergies.    Review of Systems   Review of Systems  Musculoskeletal:        R arm pain    Physical Exam Updated Vital Signs BP 118/71   Pulse 63   Temp 97.6 F (36.4 C)   Resp 17   SpO2 97%  Physical Exam Vitals and nursing note reviewed.  Constitutional:      Appearance: He is obese. He is not ill-appearing or toxic-appearing.  HENT:     Head: Normocephalic and atraumatic.     Mouth/Throat:     Mouth: Mucous membranes are moist.     Pharynx: No oropharyngeal exudate or posterior oropharyngeal erythema.  Eyes:     General:        Right eye: No discharge.        Left eye: No discharge.     Conjunctiva/sclera: Conjunctivae normal.   Cardiovascular:     Rate and Rhythm: Normal rate and regular rhythm.     Pulses: Normal pulses.     Heart sounds: Normal heart sounds.  Pulmonary:     Effort: Pulmonary effort is normal. No respiratory distress.     Breath sounds: Normal breath sounds. No wheezing or rales.  Abdominal:     General: There is no distension.     Palpations: Abdomen is soft.     Tenderness: There is no abdominal tenderness.  Musculoskeletal:        General: No deformity.     Cervical back: Neck supple.     Right lower leg: No edema.     Left lower leg: No edema.     Comments: 2+ radial pulse in the right wrist, normal appearing right hand with normal grip strength, patient unwilling to remove jacket for further examination of the upper extremity.   Skin:    General: Skin is warm and dry.  Neurological:     General: No focal deficit present.     Mental Status: He is alert and oriented to person, place, and time. Mental status is at baseline.  Psychiatric:        Mood and Affect: Mood normal.  ED Results / Procedures / Treatments   Labs (all labs ordered are listed, but only abnormal results are displayed) Labs Reviewed - No data to display  EKG None  Radiology No results found.  Procedures Procedures    Medications Ordered in ED Medications  ibuprofen (ADVIL) tablet 600 mg (600 mg Oral Given 01/03/23 2359)    ED Course/ Medical Decision Making/ A&P                             Medical Decision Making 37 year old male with chronic right upper extremity pain, request for place to sleep.   VS normal on intake, cardiopulmonary exam is normal, neurovascularly intact in the right upper extremity, exam limited due to patient's poor participation with exam.   Clinical picture most consistent with malingering, visit for secondary gain of place to sleep. Clinical concern for emergent underlying etiology for this patient that would warrant further ED workup or inpatient management is  exceedingly low.     Cole Barrett voiced understanding of his medical evaluation and treatment plan. Each of their questions answered to their expressed satisfaction.  Return precautions were given.  Patient is well-appearing, stable, and was discharged in good condition.  This chart was dictated using voice recognition software, Dragon. Despite the best efforts of this provider to proofread and correct errors, errors may still occur which can change documentation meaning.  Final Clinical Impression(s) / ED Diagnoses Final diagnoses:  Malingering    Rx / DC Orders ED Discharge Orders     None         Emeline Darling, PA-C 01/04/23 0001    Horton, Barbette Hair, MD 01/04/23 [phone]

## 2023-01-03 NOTE — ED Provider Notes (Signed)
[facility] Provider Note   CSN: [id number] Arrival date & time: 01/03/23  0258     History  Chief Complaint  Patient presents with   Medication Refill    Cole Barrett is a 37 y.o. male with history of mental disorder, PE, HLD, homelessness, paranoid behavior, schizophrenia who presents to the ER requesting pain medication for his right shoulder which he injured a few days ago. Has already been to the ER for this several times. Was given dose of tylenol in triage and feels much better. No other complaints.    Medication Refill      Home Medications Prior to Admission medications   Medication Sig Start Date End Date Taking? Authorizing Provider  ibuprofen (ADVIL) 600 MG tablet Take 1 tablet (600 mg total) by mouth every 6 (six) hours as needed. 12/24/22   Harris, Vernie Shanks, PA-C  risperiDONE (RISPERDAL M-TABS) 1 MG disintegrating tablet Take 1 tablet (1 mg total) by mouth 2 (two) times daily for 10 days. 12/24/22 01/03/23  Margarita Mail, PA-C  amantadine (SYMMETREL) 100 MG capsule Take 1 capsule (100 mg total) by mouth 2 (two) times daily. Patient not taking: Reported on 11/04/2019 09/01/19 11/17/19  Mallie Darting, NP      Allergies    Patient has no known allergies.    Review of Systems   Review of Systems  Musculoskeletal:  Positive for arthralgias.  All other systems reviewed and are negative.   Physical Exam Updated Vital Signs BP 125/70 (BP Location: Left Arm)   Pulse 80   Temp 98.2 F (36.8 C)   Resp 16   Ht 5\' 10"  (1.778 m)   Wt 99.8 kg   SpO2 99%   BMI 31.57 kg/m  Physical Exam Vitals and nursing note reviewed.  Constitutional:      Appearance: Normal appearance.  HENT:     Head: Normocephalic and atraumatic.  Eyes:     Conjunctiva/sclera: Conjunctivae normal.  Pulmonary:     Effort: Pulmonary effort is normal. No respiratory distress.  Skin:    General: Skin is warm and dry.  Neurological:      Mental Status: He is alert.  Psychiatric:        Mood and Affect: Mood normal.        Behavior: Behavior normal.     ED Results / Procedures / Treatments   Labs (all labs ordered are listed, but only abnormal results are displayed) Labs Reviewed - No data to display  EKG None  Radiology No results found.  Procedures Procedures    Medications Ordered in ED Medications  acetaminophen (TYLENOL) tablet 650 mg (650 mg Oral Given 01/03/23 0319)    ED Course/ Medical Decision Making/ A&P                             Medical Decision Making Risk OTC drugs.   Patient is a 37 year old male with history of mental disorder, PE, HLD, homelessness, paranoid behavior, schizophrenia who presents to the ER requesting pain medication for his right shoulder. Been seen in ER for this previously. Well known to this department with 74 ER visits in the past 6 months.  Given tylenol in triage, feels much better.  No other complaints.  Discharged in stable condition.   Final Clinical Impression(s) / ED Diagnoses Final diagnoses:  Other chronic pain  Homelessness    Rx / DC Orders ED  Discharge Orders     None      Portions of this report may have been transcribed using voice recognition software. Every effort was made to ensure accuracy; however, inadvertent computerized transcription errors may be present.    Estill Cotta 23/76/28 3151    Delora Fuel, MD 76/16/07 0700

## 2023-01-03 NOTE — ED Notes (Signed)
Reviewed discharge instructions with patient. Follow-up care reviewed. Patient verbalized understanding. Patient A&Ox4, VSS, and ambulatory with steady gait upon discharge.  

## 2023-01-05 ENCOUNTER — Other Ambulatory Visit: Payer: Self-pay

## 2023-01-05 ENCOUNTER — Emergency Department (HOSPITAL_COMMUNITY)
Admission: EM | Admit: 2023-01-05 | Discharge: 2023-01-05 | Disposition: A | Discharge status: Home / Self Care | Payer: Self-pay | Attending: Emergency Medicine | Admitting: Emergency Medicine

## 2023-01-05 ENCOUNTER — Encounter (HOSPITAL_COMMUNITY): Payer: Self-pay | Admitting: *Deleted

## 2023-01-05 DIAGNOSIS — Z5900 Homelessness unspecified: Secondary | ICD-10-CM | POA: Insufficient documentation

## 2023-01-05 DIAGNOSIS — R440 Auditory hallucinations: Secondary | ICD-10-CM | POA: Insufficient documentation

## 2023-01-05 NOTE — ED Triage Notes (Signed)
The pt report hearing voices for years he is homel;ess and is a frequent vivitor here

## 2023-01-05 NOTE — ED Provider Notes (Signed)
  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  History of malingering and homelessness.  No new complaints tonight.  >76 visits in the past 6 months.    History provided by patient.   Review of Systems  Pertinent positive and negative review of systems noted in HPI.    Physical Exam   Vitals:   01/05/23 0250  BP: 135/83  Pulse: 61  Resp: 18  Temp: 97.7 F (36.5 C)  SpO2: 98%    CONSTITUTIONAL:  Normal state of health, NAD NEURO:  Alert and oriented x 3, CN 3-12 grossly intact EYES:  eyes equal and reactive ENT/NECK:  Supple, no stridor  CARDIO:  appears well-perfused  PULM:  No respiratory distress,  GI/GU:  non-distended,  MSK/SPINE:  No gross deformities, no edema, moves all extremities  SKIN:  no rash, atraumatic   *Additional and/or pertinent findings included in MDM below  Diagnostic and Interventional Summary    EKG Interpretation  Date/Time:    Ventricular Rate:    PR Interval:    QRS Duration:   QT Interval:    QTC Calculation:   R Axis:     Text Interpretation:         Labs Reviewed - No data to display  No orders to display    Medications - No data to display   Procedures  /  Critical Care Procedures  ED Course and Medical Decision Making  I have reviewed the triage vital signs, the nursing notes, and pertinent available records from the EMR.  Social Determinants Affecting Complexity of Care: Patient is homelessness.   ED Course:    Medical Decision Making    Consultants: No consultations were needed in caring for this patient.   Treatment and Plan: Emergency department workup does not suggest an emergent condition requiring admission or immediate intervention beyond  what  has been performed at this time. The patient is safe for discharge and has  been instructed to return immediately for worsening symptoms, change in  symptoms or any other concerns    Final Clinical Impressions(s) / ED Diagnoses     ICD-10-CM   1. Homelessness  Z59.00       ED Discharge Orders     None         Discharge Instructions Discussed with and Provided to Patient:   Discharge Instructions   None      Montine Circle, PA-C 29/52/84 1324    Delora Fuel, MD 40/10/27 [phone]

## 2023-01-06 ENCOUNTER — Emergency Department (HOSPITAL_COMMUNITY)
Admission: EM | Admit: 2023-01-06 | Discharge: 2023-01-07 | Disposition: A | Discharge status: Home / Self Care | Payer: Self-pay | Attending: Emergency Medicine | Admitting: Emergency Medicine

## 2023-01-06 ENCOUNTER — Other Ambulatory Visit: Payer: Self-pay

## 2023-01-06 DIAGNOSIS — Z5900 Homelessness unspecified: Secondary | ICD-10-CM | POA: Insufficient documentation

## 2023-01-06 DIAGNOSIS — Z765 Malingerer [conscious simulation]: Secondary | ICD-10-CM | POA: Insufficient documentation

## 2023-01-06 DIAGNOSIS — Z760 Encounter for issue of repeat prescription: Secondary | ICD-10-CM | POA: Insufficient documentation

## 2023-01-06 MED ORDER — IBUPROFEN 400 MG PO TABS
400.0000 mg | ORAL_TABLET | Freq: Once | ORAL | Status: AC | PRN
  Administered 2023-01-06: 400 mg via ORAL
  Filled 2023-01-06: qty 1

## 2023-01-06 NOTE — ED Triage Notes (Signed)
Pt returns to ER for pain medication d/t chronic right arm pain.

## 2023-01-07 ENCOUNTER — Emergency Department (HOSPITAL_COMMUNITY)
Admission: EM | Admit: 2023-01-07 | Discharge: 2023-01-07 | Disposition: A | Discharge status: Home / Self Care | Payer: Self-pay | Attending: Emergency Medicine | Admitting: Emergency Medicine

## 2023-01-07 DIAGNOSIS — Z760 Encounter for issue of repeat prescription: Secondary | ICD-10-CM | POA: Insufficient documentation

## 2023-01-07 MED ORDER — RISPERIDONE 1 MG PO TABS
1.0000 mg | ORAL_TABLET | Freq: Once | ORAL | Status: AC
  Administered 2023-01-07: 1 mg via ORAL
  Filled 2023-01-07 (×2): qty 1

## 2023-01-07 MED ORDER — ACETAMINOPHEN 325 MG PO TABS
650.0000 mg | ORAL_TABLET | Freq: Once | ORAL | Status: AC
  Administered 2023-01-07: 650 mg via ORAL
  Filled 2023-01-07: qty 2

## 2023-01-07 NOTE — ED Notes (Signed)
AVS reviewed with pt prior to discharge. Pt verbalizes understanding. Belongings with pt upon depart. Bus pass provided to pt. Ambulatory to bus stop by self.

## 2023-01-07 NOTE — Discharge Instructions (Signed)
Take your medicine as prescribed, follow-up with your primary for refills.

## 2023-01-07 NOTE — ED Provider Notes (Signed)
[facility] Provider Note   CSN: [id number] Arrival date & time: 01/06/23  2232     History  Chief Complaint  Patient presents with   Medication Refill    Cole Barrett is a 37 y.o. male who presents with request for medication for his right arm soreness which is chronic and ongoing.  Patient well-known to this department for homelessness and malingering, greater than 75 visits in the last 6 months.  History of states  HPI     Home Medications Prior to Admission medications   Medication Sig Start Date End Date Taking? Authorizing Provider  ibuprofen (ADVIL) 600 MG tablet Take 1 tablet (600 mg total) by mouth every 6 (six) hours as needed. 12/24/22   Harris, Vernie Shanks, PA-C  risperiDONE (RISPERDAL M-TABS) 1 MG disintegrating tablet Take 1 tablet (1 mg total) by mouth 2 (two) times daily for 10 days. 12/24/22 01/03/23  Margarita Mail, PA-C  amantadine (SYMMETREL) 100 MG capsule Take 1 capsule (100 mg total) by mouth 2 (two) times daily. Patient not taking: Reported on 11/04/2019 09/01/19 11/17/19  Mallie Darting, NP      Allergies    Patient has no known allergies.    Review of Systems   Review of Systems  Musculoskeletal:        Chronic right arm pain    Physical Exam Updated Vital Signs BP 138/68 (BP Location: Right Arm)   Pulse 67   Temp 98 F (36.7 C) (Oral)   Resp 16   SpO2 100%  Physical Exam Vitals and nursing note reviewed.  Constitutional:      Appearance: He is not ill-appearing or toxic-appearing.  HENT:     Head: Normocephalic and atraumatic.  Eyes:     General: No scleral icterus.       Right eye: No discharge.        Left eye: No discharge.     Conjunctiva/sclera: Conjunctivae normal.  Cardiovascular:     Pulses:          Radial pulses are 2+ on the right side and 2+ on the left side.  Pulmonary:     Effort: Pulmonary effort is normal.  Musculoskeletal:     Comments: Patient declined to remove  arm from sweater for further evaluation.  Hand does not appear erythematous, swollen, no decreased range of motion, normal neurovascular status in the hands.  Skin:    General: Skin is warm and dry.     Capillary Refill: Capillary refill takes less than 2 seconds.  Neurological:     General: No focal deficit present.     Mental Status: He is alert.  Psychiatric:        Mood and Affect: Mood normal.     ED Results / Procedures / Treatments   Labs (all labs ordered are listed, but only abnormal results are displayed) Labs Reviewed - No data to display  EKG None  Radiology No results found.  Procedures Procedures    Medications Ordered in ED Medications  ibuprofen (ADVIL) tablet 400 mg (400 mg Oral Given 01/06/23 2245)  acetaminophen (TYLENOL) tablet 650 mg (650 mg Oral Given 01/07/23 0026)    ED Course/ Medical Decision Making/ A&P                             Medical Decision Making 37 year old male presents with chronic right arm pain, request for place to stay  to sleep tonight.  VS normal on intake, normal neurovascular status in the hand.  Well-appearing.  Risk OTC drugs. Prescription drug management.   Clinical concern for emergent underlying etiology for this patient's symptoms is exceedingly low.  Suspect primary presentation to the ED for secondary gain of place to sleep.  No further workup warranted in the ED at this time.  Cole Barrett  voiced understanding of his medical evaluation and treatment plan. Each of their questions answered to their expressed satisfaction.  Return precautions were given.  Patient is well-appearing, stable, and was discharged in good condition.  This chart was dictated using voice recognition software, Dragon. Despite the best efforts of this provider to proofread and correct errors, errors may still occur which can change documentation meaning.          Final Clinical Impression(s) / ED Diagnoses Final diagnoses:   Malingering    Rx / DC Orders ED Discharge Orders     None         Aura Dials 01/07/23 0035    Quintella Reichert, MD 01/08/23 [phone]

## 2023-01-07 NOTE — ED Provider Notes (Signed)
[facility] Provider Note   CSN: [id number] Arrival date & time: 01/07/23  [id number]     History  Chief Complaint  Patient presents with   Medication Refill    Cole Barrett is a 37 y.o. male.   Medication Refill    Patient with medical history of homelessness and schizoaffective disorder presents to the emergency department due to requesting medication refill.  He takes 1 mg of Risperdal at home, unable to take today.  Patient states it is cold outside, also requesting a sandwich.  Denies any active SI or HI.  Home Medications Prior to Admission medications   Medication Sig Start Date End Date Taking? Authorizing Provider  ibuprofen (ADVIL) 600 MG tablet Take 1 tablet (600 mg total) by mouth every 6 (six) hours as needed. Patient taking differently: Take 600 mg by mouth every 6 (six) hours as needed for mild pain. 12/24/22  Yes Harris, Abigail, PA-C  risperiDONE (RISPERDAL M-TABS) 1 MG disintegrating tablet Take 1 tablet (1 mg total) by mouth 2 (two) times daily for 10 days. 12/24/22 01/07/23 Yes Harris, Abigail, PA-C  amantadine (SYMMETREL) 100 MG capsule Take 1 capsule (100 mg total) by mouth 2 (two) times daily. Patient not taking: Reported on 11/04/2019 09/01/19 11/17/19  Mallie Darting, NP      Allergies    Patient has no known allergies.    Review of Systems   Review of Systems  Physical Exam Updated Vital Signs BP 125/82   Pulse 76   Temp 97.6 F (36.4 C)   Resp 16   SpO2 98%  Physical Exam Vitals and nursing note reviewed. Exam conducted with a chaperone present.  Constitutional:      General: He is not in acute distress.    Appearance: Normal appearance.     Comments: Disheveled  HENT:     Head: Normocephalic and atraumatic.  Eyes:     General: No scleral icterus.    Extraocular Movements: Extraocular movements intact.     Pupils: Pupils are equal, round, and reactive to light.  Cardiovascular:      Rate and Rhythm: Normal rate and regular rhythm.  Pulmonary:     Effort: Pulmonary effort is normal.  Skin:    Coloration: Skin is not jaundiced.  Neurological:     Mental Status: He is alert. Mental status is at baseline.     Coordination: Coordination normal.  Psychiatric:     Comments: Makes direct eye contact, not responding to internal stimuli, goal oriented speech      ED Results / Procedures / Treatments   Labs (all labs ordered are listed, but only abnormal results are displayed) Labs Reviewed - No data to display  EKG None  Radiology No results found.  Procedures Procedures    Medications Ordered in ED Medications  risperiDONE (RISPERDAL) tablet 1 mg (has no administration in time range)    ED Course/ Medical Decision Making/ A&P                             Medical Decision Making Risk Prescription drug management.   This is a 37 year old male presenting to the emergency department requesting medication refill.  He is requesting a single one-time dose of his Risperdal, no active SI.  Alert, does not appear to be responding to internal stimuli.  Single dose of Risperdal provided, discharged in stable condition.  Final Clinical Impression(s) / ED Diagnoses Final diagnoses:  Medication refill    Rx / DC Orders ED Discharge Orders     None         Sherrill Raring, [place] 01/07/23 0636    Quintella Reichert, MD 01/08/23 [phone]

## 2023-01-07 NOTE — ED Triage Notes (Signed)
Pt states he is here for his risperidone

## 2023-01-09 ENCOUNTER — Encounter (HOSPITAL_COMMUNITY): Payer: Self-pay | Admitting: Emergency Medicine

## 2023-01-09 ENCOUNTER — Other Ambulatory Visit: Payer: Self-pay

## 2023-01-09 ENCOUNTER — Emergency Department (HOSPITAL_COMMUNITY)
Admission: EM | Admit: 2023-01-09 | Discharge: 2023-01-09 | Disposition: A | Discharge status: Home / Self Care | Payer: Self-pay | Attending: Emergency Medicine | Admitting: Emergency Medicine

## 2023-01-09 DIAGNOSIS — Z79899 Other long term (current) drug therapy: Secondary | ICD-10-CM

## 2023-01-09 DIAGNOSIS — Z760 Encounter for issue of repeat prescription: Secondary | ICD-10-CM | POA: Insufficient documentation

## 2023-01-09 MED ORDER — RISPERIDONE 1 MG PO TABS
1.0000 mg | ORAL_TABLET | Freq: Once | ORAL | Status: AC
  Administered 2023-01-09: 1 mg via ORAL
  Filled 2023-01-09: qty 1

## 2023-01-09 MED ORDER — ACETAMINOPHEN 325 MG PO TABS
650.0000 mg | ORAL_TABLET | Freq: Once | ORAL | Status: AC
  Administered 2023-01-09: 650 mg via ORAL
  Filled 2023-01-09: qty 2

## 2023-01-09 NOTE — ED Provider Notes (Signed)
[facility] Provider Note   CSN: [id number] Arrival date & time: 01/09/23  0550     History  Chief Complaint  Patient presents with   Medication Refill    Cole Barrett is a 37 y.o. male.  Patient with concern for anemia, normally takes Risperdal.  Patient presents stating that he was unable to take his dose.  He is asking for a dose of Risperdal.  No homicidality or suicidality.       Home Medications Prior to Admission medications   Medication Sig Start Date End Date Taking? Authorizing Provider  ibuprofen (ADVIL) 600 MG tablet Take 1 tablet (600 mg total) by mouth every 6 (six) hours as needed. Patient taking differently: Take 600 mg by mouth every 6 (six) hours as needed for mild pain. 12/24/22   Harris, Vernie Shanks, PA-C  risperiDONE (RISPERDAL M-TABS) 1 MG disintegrating tablet Take 1 tablet (1 mg total) by mouth 2 (two) times daily for 10 days. 12/24/22 01/07/23  Margarita Mail, PA-C  amantadine (SYMMETREL) 100 MG capsule Take 1 capsule (100 mg total) by mouth 2 (two) times daily. Patient not taking: Reported on 11/04/2019 09/01/19 11/17/19  Mallie Darting, NP      Allergies    Patient has no known allergies.    Review of Systems   Review of Systems  Physical Exam Updated Vital Signs BP 118/74 (BP Location: Left Arm)   Pulse 80   Temp 98.6 F (37 C) (Oral)   Resp 18   SpO2 98%  Physical Exam Vitals and nursing note reviewed.  Constitutional:      General: He is not in acute distress.    Appearance: He is well-developed.  HENT:     Head: Normocephalic and atraumatic.     Mouth/Throat:     Mouth: Mucous membranes are moist.  Eyes:     General: Vision grossly intact. Gaze aligned appropriately.     Extraocular Movements: Extraocular movements intact.     Conjunctiva/sclera: Conjunctivae normal.  Cardiovascular:     Rate and Rhythm: Normal rate and regular rhythm.     Pulses: Normal pulses.     Heart  sounds: Normal heart sounds, S1 normal and S2 normal. No murmur heard.    No friction rub. No gallop.  Pulmonary:     Effort: Pulmonary effort is normal. No respiratory distress.     Breath sounds: Normal breath sounds.  Abdominal:     Palpations: Abdomen is soft.     Tenderness: There is no abdominal tenderness. There is no guarding or rebound.     Hernia: No hernia is present.  Musculoskeletal:        General: No swelling.     Cervical back: Full passive range of motion without pain, normal range of motion and neck supple. No pain with movement, spinous process tenderness or muscular tenderness. Normal range of motion.     Right lower leg: No edema.     Left lower leg: No edema.  Skin:    General: Skin is warm and dry.     Capillary Refill: Capillary refill takes less than 2 seconds.     Findings: No ecchymosis, erythema, lesion or wound.  Neurological:     Mental Status: He is alert and oriented to person, place, and time.     GCS: GCS eye subscore is 4. GCS verbal subscore is 5. GCS motor subscore is 6.     Cranial Nerves: Cranial nerves 2-12 are  intact.     Sensory: Sensation is intact.     Motor: Motor function is intact. No weakness or abnormal muscle tone.     Coordination: Coordination is intact.  Psychiatric:        Mood and Affect: Mood normal.        Speech: Speech normal.        Behavior: Behavior normal.     ED Results / Procedures / Treatments   Labs (all labs ordered are listed, but only abnormal results are displayed) Labs Reviewed - No data to display  EKG None  Radiology No results found.  Procedures Procedures    Medications Ordered in ED Medications - No data to display  ED Course/ Medical Decision Making/ A&P                             Medical Decision Making  Appears well.  Comes frequently to the ED to get out of the cold and eat, generally asked for his Risperdal while here.  Given meds, no workup necessary.        Final  Clinical Impression(s) / ED Diagnoses Final diagnoses:  Medication management    Rx / DC Orders ED Discharge Orders     None         Rabab Currington, Gwenyth Allegra, MD 01/09/23 [phone]

## 2023-01-09 NOTE — ED Notes (Signed)
DC instructions reviewed with pt. PT verbalized understanding.  PT DC. 

## 2023-01-09 NOTE — ED Triage Notes (Signed)
Pt reports he is here for his risperidone.

## 2023-01-10 ENCOUNTER — Emergency Department (HOSPITAL_COMMUNITY)
Admission: EM | Admit: 2023-01-10 | Discharge: 2023-01-11 | Disposition: A | Discharge status: Home / Self Care | Payer: Self-pay | Attending: Emergency Medicine | Admitting: Emergency Medicine

## 2023-01-10 ENCOUNTER — Other Ambulatory Visit: Payer: Self-pay

## 2023-01-10 DIAGNOSIS — Z5900 Homelessness unspecified: Secondary | ICD-10-CM | POA: Insufficient documentation

## 2023-01-10 DIAGNOSIS — R440 Auditory hallucinations: Secondary | ICD-10-CM | POA: Insufficient documentation

## 2023-01-10 NOTE — ED Triage Notes (Signed)
C/o auditory hallucinations that are telling him to hurt himself.  Patient denies si/hi Pt reports hasn't been able to take Risperdal due to being homeless Hx schizoaffective disorder.

## 2023-01-11 ENCOUNTER — Encounter (HOSPITAL_COMMUNITY): Payer: Self-pay

## 2023-01-11 MED ORDER — RISPERIDONE 0.5 MG PO TABS
1.0000 mg | ORAL_TABLET | Freq: Once | ORAL | Status: AC
  Administered 2023-01-11: 1 mg via ORAL
  Filled 2023-01-11: qty 2

## 2023-01-11 NOTE — ED Provider Notes (Signed)
[facility] Provider Note   CSN: [id number] Arrival date & time: 01/10/23  2339     History  Chief Complaint  Patient presents with   Psychiatric Evaluation    Cole Barrett is a 37 y.o. male.  HPI   Medical history including homelessness, schizophrenia, PE presents with complaints of his head not feeling right.  Patient states that he feels like the auditory hallucinations have gotten worse yesterday, states that they are telling him that he should die, not telling him to hurt others, he is not endorsing any suicidal homicidal ideations.  He denies any illicit drug use denies any alcohol use, states that he has been taking has risperidone as needed.  He has no other complaints.  He states that he thinks he just needs some rest.  Patient has been seen multiple times for similar presentation, he is homeless and typically request his risperidone and some Tylenol for pain.  Home Medications Prior to Admission medications   Medication Sig Start Date End Date Taking? Authorizing Provider  ibuprofen (ADVIL) 600 MG tablet Take 1 tablet (600 mg total) by mouth every 6 (six) hours as needed. Patient taking differently: Take 600 mg by mouth every 6 (six) hours as needed for mild pain. 12/24/22   Harris, Vernie Shanks, PA-C  risperiDONE (RISPERDAL M-TABS) 1 MG disintegrating tablet Take 1 tablet (1 mg total) by mouth 2 (two) times daily for 10 days. 12/24/22 01/07/23  Margarita Mail, PA-C  amantadine (SYMMETREL) 100 MG capsule Take 1 capsule (100 mg total) by mouth 2 (two) times daily. Patient not taking: Reported on 11/04/2019 09/01/19 11/17/19  Mallie Darting, NP      Allergies    Patient has no known allergies.    Review of Systems   Review of Systems  Constitutional:  Negative for chills and fever.  Respiratory:  Negative for shortness of breath.   Cardiovascular:  Negative for chest pain.  Gastrointestinal:  Negative for abdominal  pain.  Neurological:  Negative for headaches.    Physical Exam Updated Vital Signs BP 135/75   Pulse 96   Temp 97.9 F (36.6 C) (Oral)   Resp 18   Ht 5' 10"$  (1.778 m)   Wt 99 kg   SpO2 99%   BMI 31.32 kg/m  Physical Exam Vitals and nursing note reviewed.  Constitutional:      General: He is not in acute distress.    Appearance: Normal appearance. He is not ill-appearing or diaphoretic.  HENT:     Head: Normocephalic and atraumatic.     Nose: No congestion or rhinorrhea.  Eyes:     Conjunctiva/sclera: Conjunctivae normal.  Cardiovascular:     Rate and Rhythm: Normal rate.  Pulmonary:     Effort: Pulmonary effort is normal.  Musculoskeletal:     Cervical back: Neck supple.  Skin:    General: Skin is warm and dry.  Neurological:     Mental Status: He is alert.     Comments: No facial asymmetry no difficulty with word finding following two-step commands there is no unilateral weakness present.  Psychiatric:        Mood and Affect: Mood normal.     Comments: Patient denies suicidal homicidal ideations, he mitts to auditory hallucinations but does not appear to respond to internal stimuli, he has good insight, does not have a flat affect, he is able to maintain direct eye contact.     ED Results / Procedures /  Treatments   Labs (all labs ordered are listed, but only abnormal results are displayed) Labs Reviewed - No data to display  EKG None  Radiology No results found.  Procedures Procedures    Medications Ordered in ED Medications  risperiDONE (RISPERDAL) tablet 1 mg (1 mg Oral Given 01/11/23 0211)    ED Course/ Medical Decision Making/ A&P                             Medical Decision Making Risk Prescription drug management.   This patient presents to the ED for concern of auditory hallucinations, this involves an extensive number of treatment options, and is a complaint that carries with it a high risk of complications and morbidity.  The  differential diagnosis includes psychiatric emergency, withdrawals    Additional history obtained:  Additional history obtained from N/A External records from outside source obtained and reviewed including recent ER notes   Co morbidities that complicate the patient evaluation  Psychiatric disorder  Social Determinants of Health:  Homeless    Lab Tests:  I Ordered, and personally interpreted labs.  The pertinent results include: N/A   Imaging Studies ordered:  I ordered imaging studies including N/A I independently visualized and interpreted imaging which showed N/A I agree with the radiologist interpretation   Cardiac Monitoring:  The patient was maintained on a cardiac monitor.  I personally viewed and interpreted the cardiac monitored which showed an underlying rhythm of: Without signs of ischemia   Medicines ordered and prescription drug management:  I ordered medication including risperidone I have reviewed the patients home medicines and have made adjustments as needed  Critical Interventions:  N/A   Reevaluation:  Will write patient with his home dose Risperdal, the patient to rest and reassess.  Patient was reassessed, states he is feeling much better, agreement discharge at this time.    Consultations Obtained:  N/A    Test Considered:  N/A    Rule out I doubt withdrawals nontremulous on my exam presentation is atypical of etiology.  I doubt psychiatric emergency requiring hospitalization as patient is at his baseline, does not appear to be in acute psychiatric distress, he is endorsing any suicidal homicidal ideations.    Dispostion and problem list  After consideration of the diagnostic results and the patients response to treatment, I feel that the patent would benefit from discharge.  Auditory hallucinations-will have patient follow-up with behavioral health for further evaluation, strict return  precautions.            Final Clinical Impression(s) / ED Diagnoses Final diagnoses:  Auditory hallucination    Rx / DC Orders ED Discharge Orders     None         [name], PA-C 01/11/23 0555    Fatima Blank, MD 01/11/23 [phone]

## 2023-01-11 NOTE — Discharge Instructions (Addendum)
Please follow-up with [facility] behavioral health urgent care for further evaluation.  Come back to the emergency department if you develop chest pain, shortness of breath, severe abdominal pain, uncontrolled nausea, vomiting, diarrhea.

## 2023-01-12 ENCOUNTER — Emergency Department (HOSPITAL_COMMUNITY)
Admission: EM | Admit: 2023-01-12 | Discharge: 2023-01-12 | Disposition: A | Discharge status: Home / Self Care | Payer: Self-pay | Attending: Emergency Medicine | Admitting: Emergency Medicine

## 2023-01-12 ENCOUNTER — Encounter (HOSPITAL_COMMUNITY): Payer: Self-pay | Admitting: Emergency Medicine

## 2023-01-12 ENCOUNTER — Encounter (HOSPITAL_COMMUNITY): Payer: Self-pay

## 2023-01-12 ENCOUNTER — Other Ambulatory Visit: Payer: Self-pay

## 2023-01-12 DIAGNOSIS — M79621 Pain in right upper arm: Secondary | ICD-10-CM | POA: Insufficient documentation

## 2023-01-12 DIAGNOSIS — R440 Auditory hallucinations: Secondary | ICD-10-CM | POA: Insufficient documentation

## 2023-01-12 DIAGNOSIS — R519 Headache, unspecified: Secondary | ICD-10-CM | POA: Insufficient documentation

## 2023-01-12 MED ORDER — ACETAMINOPHEN 500 MG PO TABS
1000.0000 mg | ORAL_TABLET | Freq: Once | ORAL | Status: AC
  Administered 2023-01-12: 1000 mg via ORAL
  Filled 2023-01-12: qty 2

## 2023-01-12 MED ORDER — RISPERIDONE 1 MG PO TBDP
1.0000 mg | ORAL_TABLET | Freq: Every day | ORAL | Status: DC
  Administered 2023-01-12: 1 mg via ORAL
  Filled 2023-01-12: qty 1

## 2023-01-12 MED ORDER — ACETAMINOPHEN 325 MG PO TABS
650.0000 mg | ORAL_TABLET | Freq: Once | ORAL | Status: AC
  Administered 2023-01-12: 650 mg via ORAL
  Filled 2023-01-12: qty 2

## 2023-01-12 NOTE — ED Provider Notes (Signed)
[facility] Provider Note   CSN: [id number] Arrival date & time: 01/12/23  0557     History  Chief Complaint  Patient presents with   Arm Pain    Mckinley Clinton is a 37 y.o. male.  Pt c/o right upper arm pain. Indicates had accidentally run into something a few weeks ago. Denies new injury or acute worsening of pain. Indicates he just needed to get something for pain and something to eat. Denies arm swelling. Denies skin lesions or redness. No associated numbness or weakness, or loss of ability to use arm. No chest pain or discomfort. No sob or unusual doe. No headache (indicates earlier headache resolved) - denies acute/abrupt or severe headaches. No neck or radicular pain. No fevers.   The history is provided by the patient.  Arm Pain Pertinent negatives include no chest pain, no abdominal pain and no shortness of breath.       Home Medications Prior to Admission medications   Medication Sig Start Date End Date Taking? Authorizing Provider  ibuprofen (ADVIL) 600 MG tablet Take 1 tablet (600 mg total) by mouth every 6 (six) hours as needed. Patient taking differently: Take 600 mg by mouth every 6 (six) hours as needed for mild pain. 12/24/22   Harris, Vernie Shanks, PA-C  risperiDONE (RISPERDAL M-TABS) 1 MG disintegrating tablet Take 1 tablet (1 mg total) by mouth 2 (two) times daily for 10 days. 12/24/22 01/07/23  Margarita Mail, PA-C  amantadine (SYMMETREL) 100 MG capsule Take 1 capsule (100 mg total) by mouth 2 (two) times daily. Patient not taking: Reported on 11/04/2019 09/01/19 11/17/19  Mallie Darting, NP      Allergies    Patient has no known allergies.    Review of Systems   Review of Systems  Constitutional:  Negative for fever.  HENT:  Negative for sore throat.   Eyes:  Negative for redness.  Respiratory:  Negative for shortness of breath.   Cardiovascular:  Negative for chest pain.  Gastrointestinal:  Negative  for abdominal pain.  Genitourinary:  Negative for flank pain.  Musculoskeletal:  Negative for neck pain.  Skin:  Negative for rash.  Neurological:  Negative for weakness and numbness.  Hematological:  Does not bruise/bleed easily.  Psychiatric/Behavioral:  Negative for confusion.     Physical Exam Updated Vital Signs BP 123/70   Pulse 83   Temp 97.7 F (36.5 C)   Resp 18   SpO2 100%  Physical Exam Vitals and nursing note reviewed.  Constitutional:      Appearance: Normal appearance. He is well-developed.  HENT:     Head: Atraumatic.     Nose: Nose normal.     Mouth/Throat:     Mouth: Mucous membranes are moist.     Pharynx: Oropharynx is clear.  Eyes:     General: No scleral icterus.    Conjunctiva/sclera: Conjunctivae normal.     Pupils: Pupils are equal, round, and reactive to light.  Neck:     Trachea: No tracheal deviation.     Comments: No neck stiffness or rigidity Cardiovascular:     Rate and Rhythm: Normal rate and regular rhythm.     Pulses: Normal pulses.     Heart sounds: Normal heart sounds. No murmur heard.    No friction rub. No gallop.  Pulmonary:     Effort: Pulmonary effort is normal. No accessory muscle usage or respiratory distress.     Breath sounds: Normal breath  sounds.  Abdominal:     General: There is no distension.     Palpations: Abdomen is soft.     Tenderness: There is no abdominal tenderness.  Musculoskeletal:        General: No swelling.     Cervical back: Normal range of motion and neck supple. No rigidity or tenderness.     Comments: C spine non tender, aligned, no step off. Normal rom without pain. Good rom right shoulder, elbow and wrist without pain. No RUE swelling. No focal bony tenderness. No sts noted.   Skin:    General: Skin is warm and dry.     Findings: No rash.  Neurological:     Mental Status: He is alert.     Comments: Alert, speech clear. Motor/sens grossly intact bil. RUE nvi. Steady gait.   Psychiatric:         Mood and Affect: Mood normal.     ED Results / Procedures / Treatments   Labs (all labs ordered are listed, but only abnormal results are displayed) Labs Reviewed - No data to display  EKG None  Radiology No results found.  Procedures Procedures    Medications Ordered in ED Medications  acetaminophen (TYLENOL) tablet 1,000 mg (has no administration in time range)    ED Course/ Medical Decision Making/ A&P                             Medical Decision Making Risk OTC drugs.   Reviewed nursing notes and prior charts for additional history.   Pt asking for food/drink - provided.   Acetaminophen po.  Pt currently appears stable for d/c.   Rec pcp f/u.  Return precautions provided.           Final Clinical Impression(s) / ED Diagnoses Final diagnoses:  None    Rx / DC Orders ED Discharge Orders     None         Lajean Saver, MD 01/12/23 [phone]

## 2023-01-12 NOTE — ED Provider Notes (Signed)
[place] [facility] Provider Note   CSN: [id number] Arrival date & time: 01/12/23  0004     History  Chief Complaint  Patient presents with   Headache    Cole Barrett is a 37 y.o. male with history of schizophrenia, homelessness and malingering who presents emergency department frequently for doses of his Risperdal.  Presents this evening with mild headache and endorses worsening of his auditory hallucinations.  Patient will not expound upon what they are saying, however states that his "head is not right" because he has not had his Risperdal today.  Denies SI or HI, denies visual hallucinations.  Patient requesting doses of medications both of Risperdal and something for his headache.  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  HPI     Home Medications Prior to Admission medications   Medication Sig Start Date End Date Taking? Authorizing Provider  ibuprofen (ADVIL) 600 MG tablet Take 1 tablet (600 mg total) by mouth every 6 (six) hours as needed. Patient taking differently: Take 600 mg by mouth every 6 (six) hours as needed for mild pain. 12/24/22   Harris, Vernie Shanks, PA-C  risperiDONE (RISPERDAL M-TABS) 1 MG disintegrating tablet Take 1 tablet (1 mg total) by mouth 2 (two) times daily for 10 days. 12/24/22 01/07/23  Margarita Mail, PA-C  amantadine (SYMMETREL) 100 MG capsule Take 1 capsule (100 mg total) by mouth 2 (two) times daily. Patient not taking: Reported on 11/04/2019 09/01/19 11/17/19  Mallie Darting, NP      Allergies    Patient has no known allergies.    Review of Systems   Review of Systems  Eyes: Negative.   Neurological:  Positive for headaches.  Psychiatric/Behavioral:  Positive for hallucinations.     Physical Exam Updated Vital Signs BP 138/77 (BP Location: Right Arm)   Pulse 72   Temp 97.8 F (36.6 C) (Oral)   Resp 16   Ht 5' 10"$  (1.778 m)   Wt 99 kg   SpO2 98%   BMI 31.32 kg/m   Physical Exam Vitals and nursing note reviewed.  Constitutional:      Appearance: He is obese. He is not ill-appearing or toxic-appearing.  HENT:     Head: Normocephalic and atraumatic.  Eyes:     General: Lids are normal. Vision grossly intact. No scleral icterus.       Right eye: No discharge.        Left eye: No discharge.     Extraocular Movements: Extraocular movements intact.     Conjunctiva/sclera: Conjunctivae normal.  Cardiovascular:     Heart sounds: Normal heart sounds. No murmur heard. Pulmonary:     Effort: Pulmonary effort is normal.  Skin:    General: Skin is warm and dry.  Neurological:     General: No focal deficit present.     Mental Status: He is alert.  Psychiatric:        Attention and Perception: He perceives auditory hallucinations. He does not perceive visual hallucinations.        Mood and Affect: Mood normal.        Thought Content: Thought content normal. Thought content does not include homicidal or suicidal ideation.     ED Results / Procedures / Treatments   Labs (all labs ordered are listed, but only abnormal results are displayed) Labs Reviewed - No data to display  EKG None  Radiology No results found.  Procedures Procedures    Medications  Ordered in ED Medications  risperiDONE (RISPERDAL M-TABS) disintegrating tablet 1 mg (1 mg Oral Given 01/12/23 0153)  acetaminophen (TYLENOL) tablet 650 mg (650 mg Oral Given 01/12/23 0153)    ED Course/ Medical Decision Making/ A&P                            Medical Decision Making 37 year old male who presents with request for dose of his risperdal.   HTN on intake, VS otherwise normal. Cardiopulmonary exam unremarkable. Does not appear to be responding to internal stimuli at this time.   Risk OTC drugs. Prescription drug management.   Meds offered, no acute medical concern. No evidence of acute psychiatric crisis that would warrant further ED workup or inpatient management at this  time. No focal neuro deficit on exam.   [place] voiced understanding of his medical evaluation and treatment plan. Each of their questions answered to their expressed satisfaction.  Return precautions were given.  Patient is well-appearing, stable, and was discharged in good condition.  This chart was dictated using voice recognition software, Dragon. Despite the best efforts of this provider to proofread and correct errors, errors may still occur which can change documentation meaning.  Final Clinical Impression(s) / ED Diagnoses Final diagnoses:  Acute nonintractable headache, unspecified headache type    Rx / DC Orders ED Discharge Orders     None         Aura Dials 01/12/23 0502    Fatima Blank, MD 01/12/23 1905

## 2023-01-12 NOTE — Discharge Instructions (Signed)
It was our pleasure to provide your ER care today - we hope that you feel better.  Take acetaminophen or ibuprofen as need.   Follow up with primary care doctor in 1-2 weeks if symptoms fail to improve/resolve.  Return to ER if worse, new symptoms, severe or intractable pain, arm swelling, chest pain, or other emergency concern.

## 2023-01-12 NOTE — ED Triage Notes (Signed)
Awoke in waiting room after prior visit. Says he forgot to mention ongoing right arm pain earlier since he was having a bad headache.   Seeking some tylenol before he leaves for work.

## 2023-01-12 NOTE — ED Triage Notes (Signed)
Pt c/o of headache starting today. Denies dizziness, n/v.

## 2023-01-14 ENCOUNTER — Emergency Department (HOSPITAL_COMMUNITY)
Admission: EM | Admit: 2023-01-14 | Discharge: 2023-01-14 | Disposition: A | Discharge status: Home / Self Care | Payer: Self-pay | Attending: Emergency Medicine | Admitting: Emergency Medicine

## 2023-01-14 DIAGNOSIS — Z760 Encounter for issue of repeat prescription: Secondary | ICD-10-CM | POA: Insufficient documentation

## 2023-01-14 MED ORDER — RISPERIDONE 0.5 MG PO TABS
1.0000 mg | ORAL_TABLET | Freq: Once | ORAL | Status: AC
  Administered 2023-01-14: 1 mg via ORAL
  Filled 2023-01-14: qty 2

## 2023-01-14 MED ORDER — RISPERIDONE 0.5 MG PO TABS: 1.0000 mg | ORAL_TABLET | Freq: Once | ORAL | Status: DC

## 2023-01-14 NOTE — ED Triage Notes (Signed)
Pt requesting his dose of risperidone. Denies any additional needs/concerns.

## 2023-01-14 NOTE — ED Notes (Signed)
Dispo instructions reviewed with pt, verbalized understanding. Pt provided with snack/drink before discharge

## 2023-01-14 NOTE — ED Provider Notes (Signed)
[facility] Provider Note   CSN: [id number] Arrival date & time: 01/14/23  0406     History  Chief Complaint  Patient presents with   Medication Refill    Cole Barrett is a 37 y.o. male who is well-known to this to department with multiple visits weekly to the emergency department for various concerns, typically requested Risperdal dosage or medication for chronic right arm pain.  Patient also with history of malingering for food and place to stay in context of homelessness and schizophrenia.  Requesting his dose of Risperdal today.  No acute symptoms or need today.  HPI     Home Medications Prior to Admission medications   Medication Sig Start Date End Date Taking? Authorizing Provider  ibuprofen (ADVIL) 600 MG tablet Take 1 tablet (600 mg total) by mouth every 6 (six) hours as needed. Patient taking differently: Take 600 mg by mouth every 6 (six) hours as needed for mild pain. 12/24/22   Harris, Vernie Shanks, PA-C  risperiDONE (RISPERDAL M-TABS) 1 MG disintegrating tablet Take 1 tablet (1 mg total) by mouth 2 (two) times daily for 10 days. 12/24/22 01/07/23  Margarita Mail, PA-C  amantadine (SYMMETREL) 100 MG capsule Take 1 capsule (100 mg total) by mouth 2 (two) times daily. Patient not taking: Reported on 11/04/2019 09/01/19 11/17/19  Mallie Darting, NP      Allergies    Patient has no known allergies.    Review of Systems   Review of Systems  Constitutional:        Requesting dose of medication.  All other systems reviewed and are negative.   Physical Exam Updated Vital Signs BP 125/83 (BP Location: Right Arm)   Pulse 80   Temp 98.4 F (36.9 C) (Oral)   Resp 15   SpO2 98%  Physical Exam Vitals and nursing note reviewed.  Constitutional:      Appearance: He is obese. He is not ill-appearing or toxic-appearing.  HENT:     Head: Normocephalic and atraumatic.     Mouth/Throat:     Mouth: Mucous membranes are  moist.     Pharynx: No oropharyngeal exudate or posterior oropharyngeal erythema.  Eyes:     General:        Right eye: No discharge.        Left eye: No discharge.     Conjunctiva/sclera: Conjunctivae normal.  Cardiovascular:     Rate and Rhythm: Normal rate and regular rhythm.     Pulses: Normal pulses.     Heart sounds: Normal heart sounds.  Pulmonary:     Effort: Pulmonary effort is normal. No respiratory distress.     Breath sounds: Normal breath sounds. No wheezing or rales.  Abdominal:     General: There is no distension.     Palpations: Abdomen is soft.     Tenderness: There is no abdominal tenderness.  Musculoskeletal:        General: No deformity.     Cervical back: Neck supple.  Skin:    General: Skin is warm and dry.     Capillary Refill: Capillary refill takes less than 2 seconds.  Neurological:     General: No focal deficit present.     Mental Status: He is alert. Mental status is at baseline.  Psychiatric:        Mood and Affect: Mood normal.     ED Results / Procedures / Treatments   Labs (all labs ordered are listed,  but only abnormal results are displayed) Labs Reviewed - No data to display  EKG None  Radiology No results found.  Procedures Procedures    Medications Ordered in ED Medications  risperiDONE (RISPERDAL) tablet 1 mg (1 mg Oral Given 01/14/23 0536)    ED Course/ Medical Decision Making/ A&P                             Medical Decision Making 37 year old male requesting dose of his psychiatric medication.  Vital signs are normal on intake.  Cardiopulmonary exam is normal, abdominal exam benign.  Patient overall well-appearing and at his mental status baseline.  Risk Prescription drug management.   Dose of respite all offered.  No further workup warranted in ER at this time.  Clinical concern for emergent underlying etiology of the patient symptoms that would warrant further ED workup or inpatient management exceedingly  low.  Cole Barrett voiced understanding of his medical evaluation and treatment plan. Each of their questions answered to their expressed satisfaction.  Return precautions were given.  Patient is well-appearing, stable, and was discharged in good condition.  This chart was dictated using voice recognition software, Dragon. Despite the best efforts of this provider to proofread and correct errors, errors may still occur which can change documentation meaning.  Final Clinical Impression(s) / ED Diagnoses Final diagnoses:  Medication refill    Rx / DC Orders ED Discharge Orders     None         Aura Dials 01/14/23 [id number]    Molpus, Jenny Reichmann, MD 01/14/23 [phone]

## 2023-01-21 ENCOUNTER — Emergency Department (HOSPITAL_COMMUNITY): Admission: EM | Admit: 2023-01-21 | Discharge: 2023-01-21 | Discharge status: Against Medical Advice | Payer: Self-pay

## 2023-01-22 ENCOUNTER — Other Ambulatory Visit: Payer: Self-pay

## 2023-01-22 ENCOUNTER — Emergency Department (HOSPITAL_COMMUNITY)
Admission: EM | Admit: 2023-01-22 | Discharge: 2023-01-22 | Disposition: A | Discharge status: Home / Self Care | Payer: Self-pay | Attending: Emergency Medicine | Admitting: Emergency Medicine

## 2023-01-22 ENCOUNTER — Encounter (HOSPITAL_COMMUNITY): Payer: Self-pay | Admitting: Emergency Medicine

## 2023-01-22 DIAGNOSIS — Z5900 Homelessness unspecified: Secondary | ICD-10-CM | POA: Insufficient documentation

## 2023-01-22 DIAGNOSIS — Z5181 Encounter for therapeutic drug level monitoring: Secondary | ICD-10-CM | POA: Insufficient documentation

## 2023-01-22 DIAGNOSIS — Z7689 Persons encountering health services in other specified circumstances: Secondary | ICD-10-CM

## 2023-01-22 DIAGNOSIS — R440 Auditory hallucinations: Secondary | ICD-10-CM | POA: Insufficient documentation

## 2023-01-22 MED ORDER — RISPERIDONE 1 MG PO TBDP
1.0000 mg | ORAL_TABLET | Freq: Two times a day (BID) | ORAL | Status: DC
  Administered 2023-01-22: 1 mg via ORAL
  Filled 2023-01-22: qty 1

## 2023-01-22 NOTE — ED Triage Notes (Signed)
Pt reports he needs his risperidone, "the voices are bad tonight".  Not telling him to hurt himself or [place].

## 2023-01-22 NOTE — ED Notes (Signed)
Pt in the bathroom, Staff will notify when he comes out.

## 2023-01-22 NOTE — ED Provider Notes (Signed)
[facility] Provider Note   CSN: [id number] Arrival date & time: 01/22/23  [id number]     History  No chief complaint on file.   Ismar Puzon is a 37 y.o. male.  37 year old male presents to the emergency department requesting a dose of his risperidone.  He states that the voices in his head have been bothering him tonight and he wants his medications so that he is able to lie down.  He demands command hallucinations as well as suicidal thoughts.  He is requesting a Sprite.  The history is provided by the patient. No language interpreter was used.       Home Medications Prior to Admission medications   Medication Sig Start Date End Date Taking? Authorizing Provider  ibuprofen (ADVIL) 600 MG tablet Take 1 tablet (600 mg total) by mouth every 6 (six) hours as needed. Patient taking differently: Take 600 mg by mouth every 6 (six) hours as needed for mild pain. 12/24/22   Harris, Vernie Shanks, PA-C  risperiDONE (RISPERDAL M-TABS) 1 MG disintegrating tablet Take 1 tablet (1 mg total) by mouth 2 (two) times daily for 10 days. 12/24/22 01/07/23  Margarita Mail, PA-C  amantadine (SYMMETREL) 100 MG capsule Take 1 capsule (100 mg total) by mouth 2 (two) times daily. Patient not taking: Reported on 11/04/2019 09/01/19 11/17/19  Mallie Darting, NP      Allergies    Patient has no known allergies.    Review of Systems   Review of Systems Ten systems reviewed and are negative for acute change, except as noted in the HPI.    Physical Exam Updated Vital Signs BP 126/70 (BP Location: Right Arm)   Pulse 94   Temp 98.4 F (36.9 C) (Oral)   Resp 16   SpO2 98%   Physical Exam Vitals and nursing note reviewed.  Constitutional:      General: He is not in acute distress.    Appearance: He is well-developed. He is not diaphoretic.     Comments: Physically at his baseline, in NAD  HENT:     Head: Normocephalic and atraumatic.  Eyes:      General: No scleral icterus.    Conjunctiva/sclera: Conjunctivae normal.  Pulmonary:     Effort: Pulmonary effort is normal. No respiratory distress.     Comments: Respirations even and unlabored Musculoskeletal:        General: Normal range of motion.     Cervical back: Normal range of motion.  Skin:    General: Skin is warm and dry.     Coloration: Skin is not pale.     Findings: No erythema or rash.  Neurological:     Mental Status: He is alert and oriented to person, place, and time.  Psychiatric:        Speech: Speech normal.        Behavior: Behavior is cooperative.        Thought Content: Thought content does not include homicidal or suicidal ideation.     Comments: Not reacting to internal stimuli     ED Results / Procedures / Treatments   Labs (all labs ordered are listed, but only abnormal results are displayed) Labs Reviewed - No data to display  EKG None  Radiology No results found.  Procedures Procedures    Medications Ordered in ED Medications  risperiDONE (RISPERDAL M-TABS) disintegrating tablet 1 mg (has no administration in time range)    ED Course/ Medical Decision Making/  A&P                             Medical Decision Making Risk Prescription drug management.   This patient presents to the ED for concern of auditory hallucinations, this involves an extensive number of treatment options, and is a complaint that carries with it a high risk of complications and morbidity.    Co morbidities that complicate the patient evaluation  Paranoid behavior Schizophrenia   Cardiac Monitoring:  The patient was maintained on a cardiac monitor.  I personally viewed and interpreted the cardiac monitored which showed an underlying rhythm of: NSR   Medicines ordered and prescription drug management:  I ordered medication including risperidone for auditory hallucinations Reevaluation of the patient after these medicines showed that the patient   remained stable I have reviewed the patients home medicines and have made adjustments as needed   Problem List / ED Course:  37 year old male well-known to the emergency department presents requesting a dose of his risperidone.  He has presented numerous times in the past with a similar request.  Patient is at his known baseline without signs of psychiatric decompensation.  He has no suicidal or homicidal thoughts.  Stable for follow-up with outpatient psychiatry.   Reevaluation:  After the interventions noted above, I reevaluated the patient and found that they have :stayed the same   Social Determinants of Health:  Homeless    Dispostion:  After consideration of the diagnostic results and the patients response to treatment, I feel that the patent would benefit from outpatient psychiatric follow-up as needed.          Final Clinical Impression(s) / ED Diagnoses Final diagnoses:  Encounter for medication administration    Rx / DC Orders ED Discharge Orders     None         Antonietta Breach, PA-C 01/22/23 0530    Merryl Hacker, MD 01/22/23 2316

## 2023-01-26 ENCOUNTER — Encounter (HOSPITAL_COMMUNITY): Payer: Self-pay | Admitting: Emergency Medicine

## 2023-01-26 ENCOUNTER — Other Ambulatory Visit: Payer: Self-pay

## 2023-01-26 ENCOUNTER — Emergency Department (HOSPITAL_COMMUNITY)
Admission: EM | Admit: 2023-01-26 | Discharge: 2023-01-26 | Disposition: A | Discharge status: Home / Self Care | Payer: Self-pay | Attending: Emergency Medicine | Admitting: Emergency Medicine

## 2023-01-26 DIAGNOSIS — Z5900 Homelessness unspecified: Secondary | ICD-10-CM | POA: Insufficient documentation

## 2023-01-26 DIAGNOSIS — Z7189 Other specified counseling: Secondary | ICD-10-CM | POA: Insufficient documentation

## 2023-01-26 DIAGNOSIS — Z79899 Other long term (current) drug therapy: Secondary | ICD-10-CM

## 2023-01-26 DIAGNOSIS — R443 Hallucinations, unspecified: Secondary | ICD-10-CM | POA: Insufficient documentation

## 2023-01-26 MED ORDER — RISPERIDONE 1 MG PO TABS
1.0000 mg | ORAL_TABLET | Freq: Once | ORAL | Status: AC
  Administered 2023-01-26: 1 mg via ORAL
  Filled 2023-01-26: qty 1

## 2023-01-26 MED ORDER — ACETAMINOPHEN 500 MG PO TABS
1000.0000 mg | ORAL_TABLET | Freq: Once | ORAL | Status: AC
  Administered 2023-01-26: 1000 mg via ORAL
  Filled 2023-01-26: qty 2

## 2023-01-26 NOTE — ED Provider Notes (Signed)
[facility] Provider Note   CSN: [id number] Arrival date & time: 01/26/23  0031     History  Chief Complaint  Patient presents with   Headache    Cole Barrett is a 37 y.o. male.  The history is provided by the patient and medical records.   37 y.o. M with hx adjustment disorder, homelessness, schizophrenia, presenting to the ED for headache.  States his feels like he needs some tylenol.  Also reports hearing some voices which is chronic for him.  He has been taking his risperdal but did not take it tonight yet.  No SI/HI.  States he is wanting to talk to counselor.  Home Medications Prior to Admission medications   Medication Sig Start Date End Date Taking? Authorizing Provider  ibuprofen (ADVIL) 600 MG tablet Take 1 tablet (600 mg total) by mouth every 6 (six) hours as needed. Patient taking differently: Take 600 mg by mouth every 6 (six) hours as needed for mild pain. 12/24/22   Harris, Vernie Shanks, PA-C  risperiDONE (RISPERDAL M-TABS) 1 MG disintegrating tablet Take 1 tablet (1 mg total) by mouth 2 (two) times daily for 10 days. 12/24/22 01/07/23  Margarita Mail, PA-C  amantadine (SYMMETREL) 100 MG capsule Take 1 capsule (100 mg total) by mouth 2 (two) times daily. Patient not taking: Reported on 11/04/2019 09/01/19 11/17/19  Mallie Darting, NP      Allergies    Patient has no known allergies.    Review of Systems   Review of Systems  Psychiatric/Behavioral:  Positive for hallucinations.   All other systems reviewed and are negative.   Physical Exam Updated Vital Signs BP 127/84   Pulse 78   Temp 98.3 F (36.8 C)   Resp 16   Ht '5\' 10"'$  (1.778 m)   Wt 99 kg   SpO2 97%   BMI 31.32 kg/m   Physical Exam Vitals and nursing note reviewed.  Constitutional:      Appearance: He is well-developed.     Comments: Appears at baseline, carrying dominos pizza  HENT:     Head: Normocephalic and atraumatic.  Eyes:      Conjunctiva/sclera: Conjunctivae normal.     Pupils: Pupils are equal, round, and reactive to light.  Cardiovascular:     Rate and Rhythm: Normal rate and regular rhythm.     Heart sounds: Normal heart sounds.  Pulmonary:     Effort: Pulmonary effort is normal.     Breath sounds: Normal breath sounds.  Abdominal:     General: Bowel sounds are normal.     Palpations: Abdomen is soft.  Musculoskeletal:        General: Normal range of motion.     Cervical back: Normal range of motion.  Skin:    General: Skin is warm and dry.  Neurological:     Mental Status: He is alert and oriented to person, place, and time.  Psychiatric:     Comments: Denies SI/HI     ED Results / Procedures / Treatments   Labs (all labs ordered are listed, but only abnormal results are displayed) Labs Reviewed - No data to display  EKG None  Radiology No results found.  Procedures Procedures    Medications Ordered in ED Medications  acetaminophen (TYLENOL) tablet 1,000 mg (1,000 mg Oral Given 01/26/23 0047)  risperiDONE (RISPERDAL) tablet 1 mg (1 mg Oral Given 01/26/23 0047)    ED Course/ Medical Decision Making/ A&P  Medical Decision Making Risk OTC drugs. Prescription drug management.   37 y.o. M here with hallucinations and headache.  Chronic issue for him.  Denies SI/HI.  He appears at his baseline.  Asking for his risperdal and tylenol which were given.  He was given information for [facility] as he did express interest in talking with counselor, however do not feel this needs to be done emergently in the ER tonight.  Stable for discharge.  Can return here for new concerns.  Final Clinical Impression(s) / ED Diagnoses Final diagnoses:  Medication management    Rx / DC Orders ED Discharge Orders     None         Larene Pickett, PA-C 01/26/23 0049    Fatima Blank, MD 01/26/23 [phone]

## 2023-01-26 NOTE — Discharge Instructions (Signed)
You can go to [facility] if you feel you need to talk to counselor. Continue taking your risperdal as directed.

## 2023-01-26 NOTE — ED Triage Notes (Signed)
Pt c/o of headache and requesting his home medication.

## 2023-01-27 ENCOUNTER — Encounter (HOSPITAL_COMMUNITY): Payer: Self-pay | Admitting: Emergency Medicine

## 2023-01-27 ENCOUNTER — Emergency Department (HOSPITAL_COMMUNITY)
Admission: EM | Admit: 2023-01-27 | Discharge: 2023-01-27 | Disposition: A | Discharge status: Home / Self Care | Payer: Self-pay | Attending: Emergency Medicine | Admitting: Emergency Medicine

## 2023-01-27 DIAGNOSIS — R443 Hallucinations, unspecified: Secondary | ICD-10-CM | POA: Insufficient documentation

## 2023-01-27 MED ORDER — RISPERIDONE 0.5 MG PO TABS
1.0000 mg | ORAL_TABLET | Freq: Once | ORAL | Status: AC
  Administered 2023-01-27: 1 mg via ORAL
  Filled 2023-01-27: qty 2

## 2023-01-27 NOTE — Discharge Instructions (Signed)
If you want to talk to a counselor, you can be seen at [facility].   I have attached their contact information.

## 2023-01-27 NOTE — ED Provider Notes (Signed)
[facility] EMERGENC[facility] DEPARTMENT AT [facility] Provider Note   CSN: [id number] Arrival date & time: 01/27/23  0016     History  Chief Complaint  Patient presents with   Hallucinations    Cole Barrett is a 37 y.o. male.  The history is provided by the patient and medical records.   37 y.o. M with hx of homelessness, adjustment disorder, schizophrenia, here with hallucinations.  This is a chronic issue for him.  He states he wants to talk to a counselor.  Denies any SI/HI currently.    Patient well known to the ED for similar, I personally evaluated him last night for same.  84 visits in the past 6 months.  Home Medications Prior to Admission medications   Medication Sig Start Date End Date Taking? Authorizing Provider  ibuprofen (ADVIL) 600 MG tablet Take 1 tablet (600 mg total) by mouth every 6 (six) hours as needed. Patient taking differently: Take 600 mg by mouth every 6 (six) hours as needed for mild pain. 12/24/22   Harris, Vernie Shanks, PA-C  risperiDONE (RISPERDAL M-TABS) 1 MG disintegrating tablet Take 1 tablet (1 mg total) by mouth 2 (two) times daily for 10 days. 12/24/22 01/07/23  Margarita Mail, PA-C  amantadine (SYMMETREL) 100 MG capsule Take 1 capsule (100 mg total) by mouth 2 (two) times daily. Patient not taking: Reported on 11/04/2019 09/01/19 11/17/19  Mallie Darting, NP      Allergies    Patient has no known allergies.    Review of Systems   Review of Systems  Psychiatric/Behavioral:  Positive for hallucinations.   All other systems reviewed and are negative.   Physical Exam Updated Vital Signs BP (!) 136/90   Pulse 74   Temp (!) 97.5 F (36.4 C)   Resp 16   Ht '5\' 10"'$  (1.778 m)   Wt 100 kg   SpO2 99%   BMI 31.63 kg/m   Physical Exam Vitals and nursing note reviewed.  Constitutional:      Appearance: He is well-developed.     Comments: Eating plate of takeout, NAD  HENT:     Head: Normocephalic and atraumatic.  Eyes:      Conjunctiva/sclera: Conjunctivae normal.     Pupils: Pupils are equal, round, and reactive to light.  Cardiovascular:     Rate and Rhythm: Normal rate and regular rhythm.     Heart sounds: Normal heart sounds.  Pulmonary:     Effort: Pulmonary effort is normal.     Breath sounds: Normal breath sounds.  Abdominal:     General: Bowel sounds are normal.     Palpations: Abdomen is soft.  Musculoskeletal:        General: Normal range of motion.     Cervical back: Normal range of motion.  Skin:    General: Skin is warm and dry.  Neurological:     Mental Status: He is alert and oriented to person, place, and time.  Psychiatric:     Comments: Denies SI/HI Thoughts are concise, does not appear to be responding to internal stimuli     ED Results / Procedures / Treatments   Labs (all labs ordered are listed, but only abnormal results are displayed) Labs Reviewed - No data to display  EKG None  Radiology No results found.  Procedures Procedures    Medications Ordered in ED Medications - No data to display  ED Course/ Medical Decision Making/ A&P  Medical Decision Making Risk Prescription drug management.   37 y.o. M here with hallucinations.  Denies SI/HI.  Well known to the ED for same, 84 visits in the past 6 months for similar.  He appears at his baseline, he does not appear to be responding to internal stimuli.  I do not feel he needs emergent psychiatric evaluation.  He was given information for [facility] last night for speaking with counselor if desired, however will give it to him once more.  Can return here for new concerns.  Final Clinical Impression(s) / ED Diagnoses Final diagnoses:  Hallucinations    Rx / DC Orders ED Discharge Orders     None         Larene Pickett, PA-C 01/27/23 0058    Fatima Blank, MD 01/27/23 [phone]

## 2023-01-27 NOTE — ED Triage Notes (Signed)
Patient report auditory hallucinations, Pt stated " voices inside me wants me to take bunch of medicine". Pt denies SI/HI.

## 2023-01-28 ENCOUNTER — Emergency Department (HOSPITAL_COMMUNITY)
Admission: EM | Admit: 2023-01-28 | Discharge: 2023-01-28 | Disposition: A | Discharge status: Home / Self Care | Payer: Self-pay | Attending: Emergency Medicine | Admitting: Emergency Medicine

## 2023-01-28 ENCOUNTER — Encounter (HOSPITAL_COMMUNITY): Payer: Self-pay | Admitting: *Deleted

## 2023-01-28 ENCOUNTER — Other Ambulatory Visit: Payer: Self-pay

## 2023-01-28 DIAGNOSIS — Z760 Encounter for issue of repeat prescription: Secondary | ICD-10-CM | POA: Insufficient documentation

## 2023-01-28 MED ORDER — ACETAMINOPHEN 325 MG PO TABS
325.0000 mg | ORAL_TABLET | Freq: Once | ORAL | Status: AC
  Administered 2023-01-28: 325 mg via ORAL
  Filled 2023-01-28: qty 1

## 2023-01-28 MED ORDER — RISPERIDONE 1 MG PO TABS
1.0000 mg | ORAL_TABLET | Freq: Once | ORAL | Status: AC
  Administered 2023-01-28: 1 mg via ORAL
  Filled 2023-01-28: qty 1

## 2023-01-28 NOTE — Discharge Instructions (Signed)
Please follow-up with [facility] behavioral health once they can continue to monitor your conditions.  Come back to the emergency department if you develop chest pain, shortness of breath, severe abdominal pain, uncontrolled nausea, vomiting, diarrhea.

## 2023-01-28 NOTE — ED Triage Notes (Signed)
Patient is here for risperadal and pain medication.  He reports everything else is ok.

## 2023-01-28 NOTE — ED Provider Notes (Signed)
[facility] Provider Note   CSN: [id number] Arrival date & time: 01/28/23  0059     History  Chief Complaint  Patient presents with   Medication Refill    Cole Barrett is a 37 y.o. male.  HPI   Patient with medical history including PE, schizophrenia, homelessness presents with complaints of needing a place to get his head right.  Patient states that he started to hear voices, he states the voices are not tell him to hurt himself or hurt others, he notes that he just needs time to rest his head and have his medication.  Denies  any suicidal homicidal ideations.  States that he is working across town and forgot his risperidone.  He has no other complaints  Reviewed patient's chart been seen multiple times for similar presentation was most recently seen yesterday was given risperidone discharge.  Home Medications Prior to Admission medications   Medication Sig Start Date End Date Taking? Authorizing Provider  ibuprofen (ADVIL) 600 MG tablet Take 1 tablet (600 mg total) by mouth every 6 (six) hours as needed. Patient taking differently: Take 600 mg by mouth every 6 (six) hours as needed for mild pain. 12/24/22   Harris, Vernie Shanks, PA-C  risperiDONE (RISPERDAL M-TABS) 1 MG disintegrating tablet Take 1 tablet (1 mg total) by mouth 2 (two) times daily for 10 days. 12/24/22 01/07/23  Margarita Mail, PA-C  amantadine (SYMMETREL) 100 MG capsule Take 1 capsule (100 mg total) by mouth 2 (two) times daily. Patient not taking: Reported on 11/04/2019 09/01/19 11/17/19  Mallie Darting, NP      Allergies    Patient has no known allergies.    Review of Systems   Review of Systems  Constitutional:  Negative for chills and fever.  Respiratory:  Negative for shortness of breath.   Cardiovascular:  Negative for chest pain.  Gastrointestinal:  Negative for abdominal pain.  Neurological:  Negative for headaches.  Psychiatric/Behavioral:  Positive  for hallucinations.     Physical Exam Updated Vital Signs BP 133/78   Pulse 100   Temp 98 F (36.7 C)   Resp 17   SpO2 98%  Physical Exam Vitals and nursing note reviewed.  Constitutional:      General: He is not in acute distress.    Appearance: He is not ill-appearing.  HENT:     Head: Normocephalic and atraumatic.     Comments: No deform the head present    Nose: No congestion.  Eyes:     Conjunctiva/sclera: Conjunctivae normal.  Cardiovascular:     Rate and Rhythm: Normal rate and regular rhythm.     Pulses: Normal pulses.  Pulmonary:     Effort: Pulmonary effort is normal.  Musculoskeletal:     Comments: Moving his upper and lower extremities without difficulty  Skin:    General: Skin is warm and dry.  Neurological:     Mental Status: He is alert.     Comments: No facial asymmetry no difficulty with word finding following two-step commands there is no unilateral weakness present.  Psychiatric:        Mood and Affect: Mood normal.     Comments: Denies suicidal homicidal ideations, admits to auditory hallucinations, but is responding appropriately, makes good eye contact, does not appear to be respond to internal stimuli.     ED Results / Procedures / Treatments   Labs (all labs ordered are listed, but only abnormal results are displayed) Labs  Reviewed - No data to display  EKG None  Radiology No results found.  Procedures Procedures    Medications Ordered in ED Medications  risperiDONE (RISPERDAL) tablet 1 mg (1 mg Oral Given 01/28/23 0240)  acetaminophen (TYLENOL) tablet 325 mg (325 mg Oral Given 01/28/23 0239)    ED Course/ Medical Decision Making/ A&P                             Medical Decision Making Risk OTC drugs. Prescription drug management.   This patient presents to the ED for concern of hallucinations, this involves an extensive number of treatment options, and is a complaint that carries with it a high risk of complications and  morbidity.  The differential diagnosis includes psychiatric emergency, withdrawals    Additional history obtained:  Additional history obtained from N/A External records from outside source obtained and reviewed including recent ER notes   Co morbidities that complicate the patient evaluation  Psychiatric disorder  Social Determinants of Health:  Homelessness    Lab Tests:  I Ordered, and personally interpreted labs.  The pertinent results include: N/A   Imaging Studies ordered:  I ordered imaging studies including N/A I independently visualized and interpreted imaging which showed N/A I agree with the radiologist interpretation   Cardiac Monitoring:  The patient was maintained on a cardiac monitor.  I personally viewed and interpreted the cardiac monitored which showed an underlying rhythm of: N/A   Medicines ordered and prescription drug management:  I ordered medication including risperidone I have reviewed the patients home medicines and have made adjustments as needed  Critical Interventions:  N/A   Reevaluation:  Presents requesting his home medications, will provide him with risperidone, also given some Tylenol as endorses some arm pain.  And continue to monitor  Reassessed, he is tolerating p.o., states he is feels better and is ready for discharge at this time.  Consultations Obtained:  N/a    Test Considered:  N/a    Rule out Suspicion for withdrawals is low at this time he is nontremulous on my exam, presentation is atypical etiology.  Acute psychiatric emergency not endorse any suicidal homicidal ideations, he does not appear to respond to internal stimuli he has good insight, auditory hallucinations appear to be at his baseline.    Dispostion and problem list  After consideration of the diagnostic results and the patients response to treatment, I feel that the patent would benefit from discharge.  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            Final Clinical Impression(s) / ED Diagnoses Final diagnoses:  Encounter for medication refill    Rx / DC Orders ED Discharge Orders     None         [name], PA-C 01/28/23 [place], MD 01/28/23 [phone]

## 2023-01-28 NOTE — ED Notes (Signed)
Requested ordered medication from pharmacy

## 2023-02-01 ENCOUNTER — Encounter (HOSPITAL_COMMUNITY): Payer: Self-pay | Admitting: *Deleted

## 2023-02-01 ENCOUNTER — Emergency Department (HOSPITAL_COMMUNITY)
Admission: EM | Admit: 2023-02-01 | Discharge: 2023-02-01 | Disposition: A | Discharge status: Home / Self Care | Payer: Self-pay | Attending: Emergency Medicine | Admitting: Emergency Medicine

## 2023-02-01 ENCOUNTER — Other Ambulatory Visit: Payer: Self-pay

## 2023-02-01 DIAGNOSIS — Z765 Malingerer [conscious simulation]: Secondary | ICD-10-CM | POA: Insufficient documentation

## 2023-02-01 DIAGNOSIS — M79601 Pain in right arm: Secondary | ICD-10-CM | POA: Insufficient documentation

## 2023-02-01 NOTE — ED Triage Notes (Signed)
The pt is home less he is here aevery night and sometimes  more than that in a day.  He is c/o  arm pain

## 2023-02-01 NOTE — ED Provider Notes (Signed)
[facility] Provider Note   CSN: [id number] Arrival date & time: 02/01/23  1932     History  Chief Complaint  Patient presents with   Arm Pain    Bari Doorley is a 37 y.o. male.  With a history of schizophrenia who presents to the ED for evaluation of right arm pain and to receive his medications.  He has been coming here frequently to receive his risperidone and Tylenol.  States he injured his right arm 2 weeks ago while at work.  No recent change to this pain.  Denies numbness, weakness or tingling or swelling.  States he has auditory hallucinations at baseline with no recent change.  Denies homicidal or suicidal ideations.  States his medications are at home which is on the other side of [place].   Arm Pain       Home Medications Prior to Admission medications   Medication Sig Start Date End Date Taking? Authorizing Provider  ibuprofen (ADVIL) 600 MG tablet Take 1 tablet (600 mg total) by mouth every 6 (six) hours as needed. Patient taking differently: Take 600 mg by mouth every 6 (six) hours as needed for mild pain. 12/24/22   Harris, Vernie Shanks, PA-C  risperiDONE (RISPERDAL M-TABS) 1 MG disintegrating tablet Take 1 tablet (1 mg total) by mouth 2 (two) times daily for 10 days. 12/24/22 01/07/23  Margarita Mail, PA-C  amantadine (SYMMETREL) 100 MG capsule Take 1 capsule (100 mg total) by mouth 2 (two) times daily. Patient not taking: Reported on 11/04/2019 09/01/19 11/17/19  Mallie Darting, NP      Allergies    Patient has no known allergies.    Review of Systems   Review of Systems  All other systems reviewed and are negative.   Physical Exam Updated Vital Signs BP 131/86   Pulse 87   Temp 98.7 F (37.1 C) (Oral)   Resp 18   Ht '5\' 10"'$  (1.778 m)   Wt 100 kg   SpO2 99%   BMI 31.63 kg/m  Physical Exam Vitals and nursing note reviewed.  Constitutional:      General: He is not in acute distress.     Appearance: Normal appearance. He is normal weight. He is not ill-appearing.  HENT:     Head: Normocephalic and atraumatic.  Pulmonary:     Effort: Pulmonary effort is normal. No respiratory distress.  Abdominal:     General: Abdomen is flat.  Musculoskeletal:        General: Normal range of motion.     Cervical back: Neck supple.  Skin:    General: Skin is warm and dry.  Neurological:     Mental Status: He is alert and oriented to person, place, and time.  Psychiatric:        Mood and Affect: Mood normal.        Behavior: Behavior normal.     ED Results / Procedures / Treatments   Labs (all labs ordered are listed, but only abnormal results are displayed) Labs Reviewed - No data to display  EKG None  Radiology No results found.  Procedures Procedures    Medications Ordered in ED Medications - No data to display  ED Course/ Medical Decision Making/ A&P                             Medical Decision Making This patient presents to the ED for concern  of requesting medication  Additional history obtained from: Nursing notes from this visit. Previous records within EMR system 13 ED visits for same in the past month  Afebrile, hemodynamically stable.  37 year old male presents the ED requesting his dose of risperidone and some Tylenol.  He is seen in the ED quite frequently for this.  There is a definite aspect of malingering.  He was encouraged to return to where his medications are and take his home dose as soon as possible.  Denies homicidal or suicidal ideations.  Denies new or worsening auditory or visual hallucinations.  No recent changes to his right upper arm pain.  No neurologic deficits or new trauma.  Patient was agreeable to this plan and states he will hang out in the lobby for a while before going back home.  At this time there does not appear to be any evidence of an acute emergency medical condition and the patient appears stable for discharge with  appropriate outpatient follow up. Diagnosis was discussed with patient who verbalizes understanding of care plan and is agreeable to discharge. I have discussed return precautions with patient who verbalizes understanding. Patient encouraged to follow-up with their PCP within 1 week. All questions answered.  Patient's case discussed with Dr. Regenia Skeeter who agrees with plan to discharge with follow-up.   Note: Portions of this report may have been transcribed using voice recognition software. Every effort was made to ensure accuracy; however, inadvertent computerized transcription errors may still be present.        Final Clinical Impression(s) / ED Diagnoses Final diagnoses:  Malingering  Right arm pain    Rx / DC Orders ED Discharge Orders     None         Roylene Reason, Hershal Coria 02/01/23 2040    Sherwood Gambler, MD 02/02/23 [phone]

## 2023-02-01 NOTE — Discharge Instructions (Signed)
You have been seen today for your complaint of requesting medications Your discharge medications include your home medications.  Take them as prescribed. At this time there does not appear to be the presence of an emergent medical condition, however there is always the potential for conditions to change. Please read and follow the below instructions.  Do not take your medicine if  develop an itchy rash, swelling in your mouth or lips, or difficulty breathing; call 911 and seek immediate emergency medical attention if this occurs.  You may review your lab tests and imaging results in their entirety on your MyChart account.  Please discuss all results of fully with your primary care provider and other specialist at your follow-up visit.  Note: Portions of this text may have been transcribed using voice recognition software. Every effort was made to ensure accuracy; however, inadvertent computerized transcription errors may still be present.

## 2023-02-03 ENCOUNTER — Emergency Department (HOSPITAL_COMMUNITY)
Admission: EM | Admit: 2023-02-03 | Discharge: 2023-02-04 | Disposition: A | Discharge status: Home / Self Care | Payer: Self-pay | Attending: Emergency Medicine | Admitting: Emergency Medicine

## 2023-02-03 ENCOUNTER — Other Ambulatory Visit: Payer: Self-pay

## 2023-02-03 DIAGNOSIS — Z5900 Homelessness unspecified: Secondary | ICD-10-CM | POA: Insufficient documentation

## 2023-02-03 DIAGNOSIS — F29 Unspecified psychosis not due to a substance or known physiological condition: Secondary | ICD-10-CM | POA: Insufficient documentation

## 2023-02-03 DIAGNOSIS — Y902 Blood alcohol level of 40-59 mg/100 ml: Secondary | ICD-10-CM | POA: Insufficient documentation

## 2023-02-03 DIAGNOSIS — R45851 Suicidal ideations: Secondary | ICD-10-CM

## 2023-02-03 LAB — COMPREHENSIVE METABOLIC PANEL
ALT: 45 U/L — ABNORMAL HIGH (ref 0–44)
AST: 26 U/L (ref 15–41)
Albumin: 4.2 g/dL (ref 3.5–5.0)
Alkaline Phosphatase: 48 U/L (ref 38–126)
Anion gap: 10 (ref 5–15)
BUN: 14 mg/dL (ref 6–20)
CO2: 21 mmol/L — ABNORMAL LOW (ref 22–32)
Calcium: 8.2 mg/dL — ABNORMAL LOW (ref 8.9–10.3)
Chloride: 105 mmol/L (ref 98–111)
Creatinine, Ser: 0.94 mg/dL (ref 0.61–1.24)
GFR, Estimated: >60 mL/min (ref >60)
Glucose, Bld: 91 mg/dL (ref 70–99)
Potassium: 3.2 mmol/L — ABNORMAL LOW (ref 3.5–5.1)
Sodium: 136 mmol/L (ref 135–145)
Total Bilirubin: 0.6 mg/dL (ref 0.3–1.2)
Total Protein: 7.2 g/dL (ref 6.5–8.1)

## 2023-02-03 LAB — CBC WITH DIFFERENTIAL/PLATELET
Abs Immature Granulocytes: 0.03 10*3/uL (ref 0.00–0.07)
Basophils Absolute: 0 10*3/uL (ref 0.0–0.1)
Basophils Relative: 1 %
Eosinophils Absolute: 0.2 10*3/uL (ref 0.0–0.5)
Eosinophils Relative: 3 %
HCT: 39.8 % (ref 39.0–52.0)
Hemoglobin: 12.4 g/dL — ABNORMAL LOW (ref 13.0–17.0)
Immature Granulocytes: 1 %
Lymphocytes Relative: 35 %
Lymphs Abs: 1.7 10*3/uL (ref 0.7–4.0)
MCH: 27.4 pg (ref 26.0–34.0)
MCHC: 31.2 g/dL (ref 30.0–36.0)
MCV: 88.1 fL (ref 80.0–100.0)
Monocytes Absolute: 0.5 10*3/uL (ref 0.1–1.0)
Monocytes Relative: 10 %
Neutro Abs: 2.5 10*3/uL (ref 1.7–7.7)
Neutrophils Relative %: 50 %
Platelets: 318 10*3/uL (ref 150–400)
RBC: 4.52 MIL/uL (ref 4.22–5.81)
RDW: 13.1 % (ref 11.5–15.5)
WBC: 4.9 10*3/uL (ref 4.0–10.5)
nRBC: 0 % (ref 0.0–0.2)

## 2023-02-03 LAB — ACETAMINOPHEN LEVEL: Acetaminophen (Tylenol), Serum: <10 ug/mL — ABNORMAL LOW (ref 10–30)

## 2023-02-03 LAB — SALICYLATE LEVEL: Salicylate Lvl: <7 mg/dL — ABNORMAL LOW (ref 7.0–30.0)

## 2023-02-03 LAB — ETHANOL: Alcohol, Ethyl (B): 42 mg/dL — ABNORMAL HIGH (ref <10)

## 2023-02-03 NOTE — ED Provider Notes (Signed)
[facility] Provider Note   CSN: [id number] Arrival date & time: 02/03/23  2216     History {Add pertinent medical, surgical, social history, OB history to HPI:1} Chief Complaint  Patient presents with   Hallucinations    Cole Barrett is a 37 y.o. male who is homeless.  The patient's 86 visit in the last 6 months.  He has chronic hallucinations but denies states that he has been having command hallucinations to kill himself and is feeling extremely fearful.  He states he is taking Resporal and it does not seem to be working well for him.  Denies homicidal ideation.  HPI     Home Medications Prior to Admission medications   Medication Sig Start Date End Date Taking? Authorizing Provider  ibuprofen (ADVIL) 600 MG tablet Take 1 tablet (600 mg total) by mouth every 6 (six) hours as needed. Patient taking differently: Take 600 mg by mouth every 6 (six) hours as needed for mild pain. 12/24/22   Astria Jordahl, Vernie Shanks, PA-C  risperiDONE (RISPERDAL M-TABS) 1 MG disintegrating tablet Take 1 tablet (1 mg total) by mouth 2 (two) times daily for 10 days. 12/24/22 01/07/23  Margarita Mail, PA-C  amantadine (SYMMETREL) 100 MG capsule Take 1 capsule (100 mg total) by mouth 2 (two) times daily. Patient not taking: Reported on 11/04/2019 09/01/19 11/17/19  Mallie Darting, NP      Allergies    Patient has no known allergies.    Review of Systems   Review of Systems  Physical Exam Updated Vital Signs BP (!) 167/100   Pulse 86   Temp 98.1 F (36.7 C)   Resp 18   Ht '5\' 10"'$  (1.778 m)   Wt 100 kg   SpO2 98%   BMI 31.63 kg/m  Physical Exam Vitals and nursing note reviewed.  Constitutional:      General: He is not in acute distress.    Appearance: He is well-developed. He is not diaphoretic.  HENT:     Head: Normocephalic and atraumatic.  Eyes:     General: No scleral icterus.    Conjunctiva/sclera: Conjunctivae normal.  Cardiovascular:      Rate and Rhythm: Normal rate and regular rhythm.     Heart sounds: Normal heart sounds.  Pulmonary:     Effort: Pulmonary effort is normal. No respiratory distress.     Breath sounds: Normal breath sounds.  Abdominal:     Palpations: Abdomen is soft.     Tenderness: There is no abdominal tenderness.  Musculoskeletal:     Cervical back: Normal range of motion and neck supple.  Skin:    General: Skin is warm and dry.  Neurological:     Mental Status: He is alert.  Psychiatric:        Attention and Perception: He perceives auditory hallucinations.        Behavior: Behavior normal.     ED Results / Procedures / Treatments   Labs (all labs ordered are listed, but only abnormal results are displayed) Labs Reviewed  COMPREHENSIVE METABOLIC PANEL  ETHANOL  RAPID URINE DRUG SCREEN, HOSP PERFORMED  CBC WITH DIFFERENTIAL/PLATELET  SALICYLATE LEVEL  ACETAMINOPHEN LEVEL    EKG None  Radiology No results found.  Procedures Procedures  {Document cardiac monitor, telemetry assessment procedure when appropriate:1}  Medications Ordered in ED Medications - No data to display  ED Course/ Medical Decision Making/ A&P   {   Click here for ABCD2, HEART and other  calculatorsREFRESH Note before signing :1}                          Medical Decision Making Amount and/or Complexity of Data Reviewed Labs: ordered.   ***  {Document critical care time when appropriate:1} {Document review of labs and clinical decision tools ie heart score, Chads2Vasc2 etc:1}  {Document your independent review of radiology images, and any outside records:1} {Document your discussion with family members, caretakers, and with consultants:1} {Document social determinants of health affecting pt's care:1} {Document your decision making why or why not admission, treatments were needed:1} Final Clinical Impression(s) / ED Diagnoses Final diagnoses:  None    Rx / DC Orders ED Discharge Orders      None

## 2023-02-03 NOTE — ED Triage Notes (Signed)
Patient report he was hearing voices to kill himself. Pt stated he wants to be seen by psychiatrist.

## 2023-02-04 ENCOUNTER — Emergency Department (HOSPITAL_COMMUNITY)
Admission: EM | Admit: 2023-02-04 | Discharge: 2023-02-05 | Disposition: A | Discharge status: Home / Self Care | Payer: Self-pay | Attending: Emergency Medicine | Admitting: Emergency Medicine

## 2023-02-04 ENCOUNTER — Other Ambulatory Visit: Payer: Self-pay

## 2023-02-04 DIAGNOSIS — M79601 Pain in right arm: Secondary | ICD-10-CM | POA: Insufficient documentation

## 2023-02-04 DIAGNOSIS — Z760 Encounter for issue of repeat prescription: Secondary | ICD-10-CM | POA: Insufficient documentation

## 2023-02-04 DIAGNOSIS — R443 Hallucinations, unspecified: Secondary | ICD-10-CM | POA: Insufficient documentation

## 2023-02-04 DIAGNOSIS — Z7689 Persons encountering health services in other specified circumstances: Secondary | ICD-10-CM

## 2023-02-04 NOTE — ED Triage Notes (Signed)
The pt is here every night he is homeless  tonight he wants some pain med and another med

## 2023-02-04 NOTE — BH Assessment (Addendum)
Comprehensive Clinical Assessment (CCA) Note  02/04/2023 Cole Barrett [id number]  Disposition: Clinical report given to Leandro Reasoner, NP who states patient is psychiatrically cleared with a recommendation for outpatient mental health follow-up. RN Junior Rimando informed of the recommendation.   The patient demonstrates the following risk factors for suicide: Chronic risk factors for suicide include: psychiatric disorder of schizophrenia . Acute risk factors for suicide include: unemployment. Protective factors for this patient include: hope for the future. Considering these factors, the overall suicide risk at this point appears to be none. Patient is appropriate for outpatient follow up.  Cole Barrett is a 37 year old single male who voluntarily to [facility] ED due to auditory hallucinations. Patient reports a history of schizophrenia. Patient states he was at home tonight when he stated hearing voice. He says he went to sleep and when he woke up, the voice became louder telling him they are going to kill him. Patient states he has been hearing voices on and off for two years. Patient denies SI, HI or visual hallucinations. Patient denies any additional mental health symptoms. Patient denies any substance use. Patient UDS is negative.   Patient reports no stressors currently. Patient has no address on file; however, he denies being homeless and reports he lives alone. Patient does not work or receive disability. Patient states he has no supports. Patient denies any history of abuse or trauma. Patient denies current legal problems.   Patient is currently not receiving outpatient therapy. Patient is prescribed Risperdal from the hospital, which he reports is not working against the auditory hallucinations. Patient reports being hospitalized at [facility] last year, however no inpatient hospitalization noted on record. Per chart review, patient has had 86 ED visits in the past 6  months.  Patient is dressed in scrubs, alert and oriented with normal speech. Patient has good eye contact and there is no indication he is responding to internal stimuli. Patient has a flat affect but is cooperative throughout the assessment. Patient states "if I can't go to [facility], can I just stay the night to get my mind right." Patient denies having access to guns or weapons.   Chief Complaint:  Chief Complaint  Patient presents with   Hallucinations   Visit Diagnosis: Auditory hallucinations   CCA Screening, Triage and Referral (STR)  Patient Reported Information How did you hear about [place]? Self  What Is the Reason for Your Visit/Call Today? Patient reports to [facility] ED voluntarily, with reports of auditory hallucinations stating they are going to kill him. Patient states this has been an issue for about two years, however the voices became louder tonight.  How Long Has This Been Causing You Problems? > than 6 months  What Do You Feel Would Help You the Most Today? Treatment for Depression or other mood problem   Have You Recently Had Any Thoughts About Hurting Yourself? No  Are You Planning to Commit Suicide/Harm Yourself At This time? No   Flowsheet Row ED from 02/03/2023 in [facility] Emergency Department at [facility] ED from 02/01/2023 in [facility] Department at [facility] ED from 01/28/2023 in [facility] Emergency Department at [place] No Risk No Risk No Risk       Have you Recently Had Thoughts About [place]? No  Are You Planning to Harm Someone at This Time? No  Explanation: N/A   Have You Used Any Alcohol or Drugs in the Past 24  Hours? No  What Did You Use and How Much? N/A   Do You Currently Have a Therapist/Psychiatrist? No  Name of Therapist/Psychiatrist: Name of Therapist/Psychiatrist: N/A   Have You Been Recently Discharged From Any Office Practice or Programs?  No  Explanation of Discharge From Practice/Program: N/A     CCA Screening Triage Referral Assessment Type of Contact: Tele-Assessment  Telemedicine Service Delivery: Telemedicine service delivery: This service was provided via telemedicine using a 2-way, interactive audio and video technology  Is this Initial or Reassessment? Is this Initial or Reassessment?: Initial Assessment  Date Telepsych consult ordered in [facility]:  Date Telepsych consult ordered in [facility]: 02/04/23  Time Telepsych consult ordered in [facility]:  Time Telepsych consult ordered in [facility]: 0002  Location of Assessment: [facility] ED  Provider Location: [facility] Assessment Services   Collateral Involvement: None   Does Patient Have a [place]? No  Legal Guardian Contact Information: N/A  Copy of Legal Guardianship Form: -- (N/A)  Legal Guardian Notified of Arrival: -- (N/A)  Legal Guardian Notified of Pending Discharge: -- (N/A)  If Minor and Not Living with Parent(s), Who has Custody? N/A  Is CPS involved or ever been involved? Never  Is APS involved or ever been involved? Never   Patient Determined To Be At Risk for Harm To Self or Others Based on Review of Patient Reported Information or Presenting Complaint? No  Method: No Plan (Denies SI/HI)  Availability of Means: No access or NA (Denies SI/HI)  Intent: Vague intent or NA (Denies SI/HI)  Notification Required: -- (Denies SI/HI)  Additional Information for Danger to Others Potential: -- (N/A)  Additional Comments for Danger to Others Potential: N/A  Are There Guns or Other Weapons in Your Home? No  Types of Guns/Weapons: N/A  Are These Weapons Safely Secured?                            -- (N/A)  Who Could Verify You Are Able To Have These Secured: N/A  Do You Have any Outstanding Charges, Pending Court Dates, Parole/Probation? No  Contacted To Inform of Risk of Harm To Self or Others: -- (N/A)    Does Patient Present under  Involuntary Commitment? No    [place] of Residence: [name]   Patient Currently Receiving the Following Services: Not Receiving Services   Determination of Need: Routine (7 days)   Options For Referral: Medication Management; Outpatient Therapy     CCA Biopsychosocial Patient Reported Schizophrenia/Schizoaffective Diagnosis in Past: Yes   Strengths: Patient cooperative during assessment   Mental Health Symptoms Depression:   None   Duration of Depressive symptoms:    Mania:   None   Anxiety:    None   Psychosis:   Hallucinations   Duration of Psychotic symptoms:  Duration of Psychotic Symptoms: Greater than six months   Trauma:  No data recorded  Obsessions:   None   Compulsions:   None   Inattention:   None   Hyperactivity/Impulsivity:   None   Oppositional/Defiant Behaviors:   None   Emotional Irregularity:   None   Other Mood/Personality Symptoms:   N/A    Mental Status Exam Appearance and self-care  Stature:   Average   Weight:   Average weight   Clothing:   -- (Hosptial scrubs)   Grooming:   Normal   Cosmetic use:   None   Posture/gait:   Normal   Motor activity:  Not Remarkable   Sensorium  Attention:   Normal   Concentration:   Normal   Orientation:   X5   Recall/memory:   Normal   Affect and Mood  Affect:   Depressed   Mood:   Anxious   Relating  Eye contact:   Normal   Facial expression:   Constricted   Attitude toward examiner:   Cooperative   Thought and Language  Speech flow:  Normal   Thought content:   Appropriate to Mood and Circumstances   Preoccupation:   None   Hallucinations:   Auditory   Organization:   Engineer, building services of Knowledge:   Average   Intelligence:   Average   Abstraction:   Normal   Judgement:   Normal   Reality Testing:   Adequate   Insight:   Fair   Decision Making:   Normal   Social Functioning  Social  Maturity:   Isolates   Social Judgement:   Normal   Stress  Stressors:   Other (Comment) (Patient denies having stressors)   Coping Ability:   Deficient supports   Skill Deficits:   None   Supports:   Support needed     Religion: Religion/Spirituality Are You A Religious Person?: Yes What is Your Religious Affiliation?: Baptist How Might This Affect Treatment?: N/A  Leisure/Recreation: Leisure / Recreation Do You Have Hobbies?: Yes Leisure and Hobbies: Music and watching television  Exercise/Diet: Exercise/Diet Do You Exercise?: No Have You Gained or Lost A Significant Amount of Weight in the Past Six Months?: No Do You Follow a Special Diet?: No Do You Have Any Trouble Sleeping?: No   CCA Employment/Education Employment/Work Situation: Employment / Work Situation Employment Situation: Unemployed Patient's Job has Been Impacted by Current Illness: No Has Patient ever Been in Passenger transport manager?: No  Education: Education Is Patient Currently Attending School?: No Last Grade Completed: 12 Did You Nutritional therapist?: No Did You Have An Individualized Education Program (IIEP): No Did You Have Any Difficulty At [employer]?: No Patient's Education Has Been Impacted by Current Illness: No   CCA Family/Childhood History Family and Relationship History: Family history Marital status: Single Does patient have children?: No  Childhood History:  Childhood History By whom was/is the patient raised?: Mother Did patient suffer any verbal/emotional/physical/sexual abuse as a child?: No Did patient suffer from severe childhood neglect?: No Has patient ever been sexually abused/assaulted/raped as an adolescent or adult?: No Was the patient ever a victim of a crime or a disaster?: No Witnessed domestic violence?: No Has patient been affected by domestic violence as an adult?: No       CCA Substance Use Alcohol/Drug Use: Alcohol / Drug Use Pain Medications: See  MAR Prescriptions: See MAR Over the Counter: See MAR History of alcohol / drug use?: No history of alcohol / drug abuse Longest period of sobriety (when/how long): N/A Negative Consequences of Use:  (N/A) Withdrawal Symptoms:  (N/A)                         ASAM's:  Six Dimensions of Multidimensional Assessment  Dimension 1:  Acute Intoxication and/or Withdrawal Potential:      Dimension 2:  Biomedical Conditions and Complications:      Dimension 3:  Emotional, Behavioral, or Cognitive Conditions and Complications:     Dimension 4:  Readiness to Change:     Dimension 5:  Relapse, Continued use, or Continued Problem Potential:  Dimension 6:  Recovery/Living Environment:     ASAM Severity Score:    ASAM Recommended Level of Treatment:     Substance use Disorder (SUD)    Recommendations for Services/Supports/Treatments:    Discharge Disposition:    DSM5 Diagnoses: Patient Active Problem List   Diagnosis Date Noted   Acute psychosis ([place]) 08/31/2019   Schizophrenia ([place]) 08/31/2019   Schizoaffective disorder, bipolar type ([place]) 01/19/2019   Alcohol abuse    Auditory hallucinations    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/08/2016   Homelessness 09/03/2016   Person feigning illness 09/03/2016   Brief psychotic disorder ([place]) 08/29/2016   Cannabis use disorder, mild, abuse 08/29/2016   Pulmonary emboli ([place]) 07/13/2016   Adjustment disorder with emotional disturbance 03/12/2014   Pulmonary embolism and infarction ([place]) 01/02/2014   Acute left flank pain 01/02/2014   Pulmonary embolism, bilateral ([place]) 01/02/2014     Referrals to Alternative Service(s): Referred to Alternative Service(s):   Place:   Date:   Time:    Referred to Alternative Service(s):   Place:   Date:   Time:    Referred to Alternative Service(s):   Place:   Date:   Time:    Referred to Alternative Service(s):   Place:   Date:   Time:     ALTHA GILLEO, LCSW

## 2023-02-05 ENCOUNTER — Other Ambulatory Visit: Payer: Self-pay

## 2023-02-05 ENCOUNTER — Emergency Department (HOSPITAL_COMMUNITY)
Admission: EM | Admit: 2023-02-05 | Discharge: 2023-02-05 | Disposition: A | Discharge status: Home / Self Care | Payer: Self-pay | Attending: Emergency Medicine | Admitting: Emergency Medicine

## 2023-02-05 ENCOUNTER — Encounter (HOSPITAL_COMMUNITY): Payer: Self-pay | Admitting: *Deleted

## 2023-02-05 DIAGNOSIS — Z7689 Persons encountering health services in other specified circumstances: Secondary | ICD-10-CM

## 2023-02-05 DIAGNOSIS — R519 Headache, unspecified: Secondary | ICD-10-CM | POA: Insufficient documentation

## 2023-02-05 DIAGNOSIS — Z5181 Encounter for therapeutic drug level monitoring: Secondary | ICD-10-CM | POA: Insufficient documentation

## 2023-02-05 MED ORDER — IBUPROFEN 400 MG PO TABS
600.0000 mg | ORAL_TABLET | Freq: Once | ORAL | Status: AC
  Administered 2023-02-05: 600 mg via ORAL
  Filled 2023-02-05: qty 1

## 2023-02-05 MED ORDER — RISPERIDONE 1 MG PO TBDP
1.0000 mg | ORAL_TABLET | Freq: Two times a day (BID) | ORAL | Status: DC
  Administered 2023-02-05: 1 mg via ORAL
  Filled 2023-02-05: qty 1

## 2023-02-05 MED ORDER — RISPERIDONE 1 MG PO TABS: 1.0000 mg | ORAL_TABLET | Freq: Once | ORAL | Status: DC

## 2023-02-05 MED ORDER — ACETAMINOPHEN 500 MG PO TABS: 1000.0000 mg | ORAL_TABLET | Freq: Once | ORAL | Status: DC

## 2023-02-05 NOTE — ED Provider Notes (Signed)
[facility] Provider Note   CSN: [id number] Arrival date & time: 02/04/23  2344     History  Chief Complaint  Patient presents with   wants medicine    Cole Barrett is a 37 y.o. male.  37 year old male presents to the emergency department requesting ibuprofen for right arm pain as well as a dose of his risperidone.  States that the voices in his head have been bothering him today.  He has no additional complaints.  The history is provided by the patient. No language interpreter was used.       Home Medications Prior to Admission medications   Medication Sig Start Date End Date Taking? Authorizing Provider  ibuprofen (ADVIL) 600 MG tablet Take 1 tablet (600 mg total) by mouth every 6 (six) hours as needed. Patient taking differently: Take 600 mg by mouth every 6 (six) hours as needed for mild pain. 12/24/22   Harris, Vernie Shanks, PA-C  risperiDONE (RISPERDAL M-TABS) 1 MG disintegrating tablet Take 1 tablet (1 mg total) by mouth 2 (two) times daily for 10 days. 12/24/22 01/07/23  Margarita Mail, PA-C  amantadine (SYMMETREL) 100 MG capsule Take 1 capsule (100 mg total) by mouth 2 (two) times daily. Patient not taking: Reported on 11/04/2019 09/01/19 11/17/19  Mallie Darting, NP      Allergies    Patient has no known allergies.    Review of Systems   Review of Systems Ten systems reviewed and are negative for acute change, except as noted in the HPI.    Physical Exam Updated Vital Signs BP 125/74 (BP Location: Right Arm)   Pulse 81   Temp 98.5 F (36.9 C) (Oral)   Resp 15   Ht '5\' 10"'$  (1.778 m)   Wt 100 kg   SpO2 97%   BMI 31.63 kg/m   Physical Exam Vitals and nursing note reviewed.  Constitutional:      General: He is not in acute distress.    Appearance: He is well-developed. He is not diaphoretic.     Comments: Disheveled; foul smelling. Calm and cooperative.  HENT:     Head: Normocephalic and atraumatic.   Eyes:     General: No scleral icterus.    Conjunctiva/sclera: Conjunctivae normal.  Pulmonary:     Effort: Pulmonary effort is normal. No respiratory distress.  Musculoskeletal:        General: Normal range of motion.     Cervical back: Normal range of motion.  Skin:    General: Skin is warm and dry.     Coloration: Skin is not pale.     Findings: No erythema or rash.  Neurological:     Mental Status: He is alert and oriented to person, place, and time.     Coordination: Coordination normal.  Psychiatric:        Behavior: Behavior is cooperative.     Comments: Not reacting to internal stimuli.     ED Results / Procedures / Treatments   Labs (all labs ordered are listed, but only abnormal results are displayed) Labs Reviewed - No data to display  EKG None  Radiology No results found.  Procedures Procedures    Medications Ordered in ED Medications  risperiDONE (RISPERDAL M-TABS) disintegrating tablet 1 mg (1 mg Oral Given 02/05/23 0104)  ibuprofen (ADVIL) tablet 600 mg (600 mg Oral Given 02/05/23 0104)    ED Course/ Medical Decision Making/ A&P  Medical Decision Making Risk Prescription drug management.   This patient presents to the ED for concern of hallucinations, this involves an extensive number of treatment options, and is a complaint that carries with it a high risk of complications and morbidity.    Co morbidities that complicate the patient evaluation  Schizophrenia  HLD   Additional history obtained:  External records from outside source obtained and reviewed including CT head from 11/18/22 which was normal.   Cardiac Monitoring:  The patient was maintained on a cardiac monitor.  I personally viewed and interpreted the cardiac monitored which showed an underlying rhythm of: NSR   Medicines ordered and prescription drug management:  I ordered medication including Risperidone for hallucinations and ibuprofen for  RUE pain Reevaluation of the patient after these medicines showed that the patient  remained stable I have reviewed the patients home medicines and have made adjustments as needed   Test Considered:  Xray RUE - however demonstrates preserved ROM   Problem List / ED Course:  Medications administered   Reevaluation:  After the interventions noted above, I reevaluated the patient and found that they have :stayed the same   Social Determinants of Health:  Homelessness   Dispostion:  After consideration of the diagnostic results and the patients response to treatment, I feel that the patent would benefit from outpatient [facility] follow up. Return precautions discussed and provided. Patient discharged in stable condition with no unaddressed concerns.          Final Clinical Impression(s) / ED Diagnoses Final diagnoses:  Encounter for medication administration    Rx / DC Orders ED Discharge Orders     None         Antonietta Breach, PA-C 02/05/23 0113    Mesner, Corene Cornea, MD 02/05/23 [phone]

## 2023-02-05 NOTE — ED Triage Notes (Signed)
Patient reports headache today , denies head injury , respirations unlabored.

## 2023-02-05 NOTE — ED Provider Notes (Signed)
[facility] Provider Note   CSN: [id number] Arrival date & time: 02/05/23  2144     History  Chief Complaint  Patient presents with   Headache    Cole Barrett is a 37 y.o. male.  The history is provided by the patient and medical records.  Headache  37 y.o. M here with headache.  Also requesting his risperdal.  He is seen almost nightly for same.  He denies any other associated symptoms.  Home Medications Prior to Admission medications   Medication Sig Start Date End Date Taking? Authorizing Provider  ibuprofen (ADVIL) 600 MG tablet Take 1 tablet (600 mg total) by mouth every 6 (six) hours as needed. Patient taking differently: Take 600 mg by mouth every 6 (six) hours as needed for mild pain. 12/24/22   Harris, Vernie Shanks, PA-C  risperiDONE (RISPERDAL M-TABS) 1 MG disintegrating tablet Take 1 tablet (1 mg total) by mouth 2 (two) times daily for 10 days. 12/24/22 01/07/23  Margarita Mail, PA-C  amantadine (SYMMETREL) 100 MG capsule Take 1 capsule (100 mg total) by mouth 2 (two) times daily. Patient not taking: Reported on 11/04/2019 09/01/19 11/17/19  Mallie Darting, NP      Allergies    Patient has no known allergies.    Review of Systems   Review of Systems  Neurological:  Positive for headaches.  All other systems reviewed and are negative.   Physical Exam Updated Vital Signs BP 121/67 (BP Location: Right Arm)   Pulse 81   Temp 98.4 F (36.9 C) (Oral)   Resp 18   SpO2 97%   Physical Exam Vitals and nursing note reviewed.  Constitutional:      Appearance: He is well-developed.     Comments: Sleeping in triage chair, awoken for exam  HENT:     Head: Normocephalic and atraumatic.  Eyes:     Conjunctiva/sclera: Conjunctivae normal.     Pupils: Pupils are equal, round, and reactive to light.  Cardiovascular:     Rate and Rhythm: Normal rate and regular rhythm.     Heart sounds: Normal heart sounds.   Pulmonary:     Effort: Pulmonary effort is normal.     Breath sounds: Normal breath sounds.  Abdominal:     General: Bowel sounds are normal.     Palpations: Abdomen is soft.  Musculoskeletal:        General: Normal range of motion.     Cervical back: Normal range of motion.  Skin:    General: Skin is warm and dry.  Neurological:     Mental Status: He is alert and oriented to person, place, and time.     Comments: Sleeping but awakes on exam, appears at baseline, no focal deficits, ambulatory     ED Results / Procedures / Treatments   Labs (all labs ordered are listed, but only abnormal results are displayed) Labs Reviewed - No data to display  EKG None  Radiology No results found.  Procedures Procedures    Medications Ordered in ED Medications  risperiDONE (RISPERDAL) tablet 1 mg (has no administration in time range)  acetaminophen (TYLENOL) tablet 1,000 mg (has no administration in time range)    ED Course/ Medical Decision Making/ A&P                             Medical Decision Making Risk OTC drugs. Prescription drug management.   [place]  y.o. Jerilynn Mages here with headache.  Also requesting his risperdal.  He is well known to this ED for same, seen almost nightly.  VSS.  He appears at baseline.  No focal deficits.  Given tylenol and Risperdal.  Stable for discharge.  Final Clinical Impression(s) / ED Diagnoses Final diagnoses:  Encounter for medication administration    Rx / DC Orders ED Discharge Orders     None         Larene Pickett, PA-C 02/05/23 2306    Ripley Fraise, MD 02/06/23 [phone]

## 2023-02-06 ENCOUNTER — Other Ambulatory Visit: Payer: Self-pay

## 2023-02-06 ENCOUNTER — Emergency Department (HOSPITAL_COMMUNITY)
Admission: EM | Admit: 2023-02-06 | Discharge: 2023-02-07 | Disposition: A | Discharge status: Home / Self Care | Payer: Self-pay | Attending: Emergency Medicine | Admitting: Emergency Medicine

## 2023-02-06 DIAGNOSIS — Z760 Encounter for issue of repeat prescription: Secondary | ICD-10-CM | POA: Insufficient documentation

## 2023-02-06 DIAGNOSIS — Z7689 Persons encountering health services in other specified circumstances: Secondary | ICD-10-CM

## 2023-02-06 NOTE — ED Triage Notes (Signed)
Patient requesting Risperidal for his auditory hallucinations .

## 2023-02-07 MED ORDER — RISPERIDONE 1 MG PO TABS
1.0000 mg | ORAL_TABLET | Freq: Once | ORAL | Status: AC
  Administered 2023-02-07: 1 mg via ORAL
  Filled 2023-02-07: qty 1

## 2023-02-07 NOTE — ED Provider Notes (Signed)
[facility] Provider Note   CSN: [id number] Arrival date & time: 02/06/23  2217     History  Chief Complaint  Patient presents with   Needs Risperidal /Hallucinations    Cole Barrett is a 37 y.o. male.  The history is provided by the patient.  Illness Location:  At home Quality:  Needs a dose of his risperdal Severity:  Mild Onset quality:  Gradual Duration:  1 day Timing:  Constant Progression:  Unchanged Chronicity:  New Associated symptoms: no fever and no wheezing        Home Medications Prior to Admission medications   Medication Sig Start Date End Date Taking? Authorizing Provider  ibuprofen (ADVIL) 600 MG tablet Take 1 tablet (600 mg total) by mouth every 6 (six) hours as needed. Patient taking differently: Take 600 mg by mouth every 6 (six) hours as needed for mild pain. 12/24/22   Harris, Vernie Shanks, PA-C  risperiDONE (RISPERDAL M-TABS) 1 MG disintegrating tablet Take 1 tablet (1 mg total) by mouth 2 (two) times daily for 10 days. 12/24/22 01/07/23  Margarita Mail, PA-C  amantadine (SYMMETREL) 100 MG capsule Take 1 capsule (100 mg total) by mouth 2 (two) times daily. Patient not taking: Reported on 11/04/2019 09/01/19 11/17/19  Mallie Darting, NP      Allergies    Patient has no known allergies.    Review of Systems   Review of Systems  Constitutional:  Negative for fever.  HENT:  Negative for facial swelling.   Eyes:  Negative for redness.  Respiratory:  Negative for wheezing and stridor.   Psychiatric/Behavioral:  Negative for self-injury. The patient is not nervous/anxious.   All other systems reviewed and are negative.   Physical Exam Updated Vital Signs BP 123/87 (BP Location: Right Arm)   Pulse 71   Temp 98 F (36.7 C) (Oral)   Resp 16   SpO2 98%  Physical Exam Vitals and nursing note reviewed.  Constitutional:      General: He is not in acute distress.    Appearance: Normal  appearance. He is well-developed. He is not diaphoretic.  HENT:     Head: Normocephalic and atraumatic.     Nose: Nose normal.  Eyes:     Conjunctiva/sclera: Conjunctivae normal.     Pupils: Pupils are equal, round, and reactive to light.  Cardiovascular:     Rate and Rhythm: Normal rate and regular rhythm.     Pulses: Normal pulses.     Heart sounds: Normal heart sounds.  Pulmonary:     Effort: Pulmonary effort is normal.     Breath sounds: Normal breath sounds. No wheezing or rales.  Abdominal:     General: Bowel sounds are normal.     Palpations: Abdomen is soft.     Tenderness: There is no abdominal tenderness. There is no guarding or rebound.  Musculoskeletal:        General: Normal range of motion.     Cervical back: Normal range of motion and neck supple.  Skin:    General: Skin is warm and dry.     Capillary Refill: Capillary refill takes less than 2 seconds.  Neurological:     General: No focal deficit present.     Mental Status: He is alert and oriented to person, place, and time.     Deep Tendon Reflexes: Reflexes normal.  Psychiatric:        Mood and Affect: Mood normal.  Behavior: Behavior normal.     ED Results / Procedures / Treatments   Labs (all labs ordered are listed, but only abnormal results are displayed) Labs Reviewed - No data to display  EKG None  Radiology No results found.  Procedures Procedures    Medications Ordered in ED Medications  risperiDONE (RISPERDAL) tablet 1 mg (1 mg Oral Given 02/07/23 0310)    ED Course/ Medical Decision Making/ A&P                             Medical Decision Making Patient here because he needs a dose of his home medication.  No SI or HI  Amount and/or Complexity of Data Reviewed External Data Reviewed: notes.    Details: Previous notes reviewed   Risk Prescription drug management. Risk Details: Well appearing, no SI or HI.  Follow up with your therapist of [facility] for ongoing care.       Final Clinical Impression(s) / ED Diagnoses Final diagnoses:  Encounter for medication administration   Return for intractable cough, coughing up blood, fevers > 100.4 unrelieved by medication, shortness of breath, intractable vomiting, chest pain, shortness of breath, weakness, numbness, changes in speech, facial asymmetry, abdominal pain, passing out, Inability to tolerate liquids or food, cough, altered mental status or any concerns. No signs of systemic illness or infection. The patient is nontoxic-appearing on exam and vital signs are within normal limits.  I have reviewed the triage vital signs and the nursing notes. Pertinent labs & imaging results that were available during my care of the patient were reviewed by me and considered in my medical decision making (see chart for details). After history, exam, and medical workup I feel the patient has been appropriately medically screened and is safe for discharge home. Pertinent diagnoses were discussed with the patient. Patient was given return precautions.  Rx / DC Orders ED Discharge Orders     None         Weslie Rasmus, MD 02/07/23 [id number]

## 2023-02-08 ENCOUNTER — Other Ambulatory Visit: Payer: Self-pay

## 2023-02-08 ENCOUNTER — Encounter (HOSPITAL_COMMUNITY): Payer: Self-pay

## 2023-02-08 ENCOUNTER — Emergency Department (HOSPITAL_COMMUNITY)
Admission: EM | Admit: 2023-02-08 | Discharge: 2023-02-08 | Disposition: A | Discharge status: Home / Self Care | Payer: Self-pay | Attending: Emergency Medicine | Admitting: Emergency Medicine

## 2023-02-08 DIAGNOSIS — M79601 Pain in right arm: Secondary | ICD-10-CM | POA: Insufficient documentation

## 2023-02-08 DIAGNOSIS — Z7689 Persons encountering health services in other specified circumstances: Secondary | ICD-10-CM

## 2023-02-08 DIAGNOSIS — R443 Hallucinations, unspecified: Secondary | ICD-10-CM | POA: Insufficient documentation

## 2023-02-08 DIAGNOSIS — Z760 Encounter for issue of repeat prescription: Secondary | ICD-10-CM | POA: Insufficient documentation

## 2023-02-08 MED ORDER — ACETAMINOPHEN 500 MG PO TABS
1000.0000 mg | ORAL_TABLET | Freq: Once | ORAL | Status: AC
  Administered 2023-02-08: 1000 mg via ORAL
  Filled 2023-02-08: qty 2

## 2023-02-08 MED ORDER — RISPERIDONE 1 MG PO TABS
1.0000 mg | ORAL_TABLET | Freq: Once | ORAL | Status: AC
  Administered 2023-02-08: 1 mg via ORAL
  Filled 2023-02-08: qty 1

## 2023-02-08 NOTE — Discharge Instructions (Addendum)
Please follow up with your behavioral health providers for your long term psychiatric needs.

## 2023-02-08 NOTE — ED Triage Notes (Addendum)
Pt is here for his Risparadone and tylenol for pain right arm after "running into a wall".

## 2023-02-08 NOTE — ED Provider Notes (Signed)
[facility] Provider Note   CSN: [id number] Arrival date & time: 02/08/23  2148     History  Chief Complaint  Patient presents with   Medication Refill    Cole Barrett is a 37 y.o. male.  Requesting dose of Risparadone for hallucinations and tylenol for pain right arm after "running into a wall".      Medication Refill      Home Medications Prior to Admission medications   Medication Sig Start Date End Date Taking? Authorizing Provider  ibuprofen (ADVIL) 600 MG tablet Take 1 tablet (600 mg total) by mouth every 6 (six) hours as needed. Patient taking differently: Take 600 mg by mouth every 6 (six) hours as needed for mild pain. 12/24/22   Harris, Vernie Shanks, PA-C  risperiDONE (RISPERDAL M-TABS) 1 MG disintegrating tablet Take 1 tablet (1 mg total) by mouth 2 (two) times daily for 10 days. 12/24/22 01/07/23  Margarita Mail, PA-C  amantadine (SYMMETREL) 100 MG capsule Take 1 capsule (100 mg total) by mouth 2 (two) times daily. Patient not taking: Reported on 11/04/2019 09/01/19 11/17/19  Mallie Darting, NP      Allergies    Patient has no known allergies.    Review of Systems   Review of Systems  Musculoskeletal:  Positive for arthralgias.  Psychiatric/Behavioral:  Positive for hallucinations.   All other systems reviewed and are negative.   Physical Exam Updated Vital Signs BP 128/71   Pulse 95   Temp 98.2 F (36.8 C) (Oral)   Resp 17   Ht '5\' 10"'$  (1.778 m)   Wt 100 kg   SpO2 100%   BMI 31.63 kg/m  Physical Exam Vitals and nursing note reviewed.  Constitutional:      Appearance: Normal appearance.  HENT:     Head: Normocephalic and atraumatic.  Eyes:     Conjunctiva/sclera: Conjunctivae normal.  Pulmonary:     Effort: Pulmonary effort is normal. No respiratory distress.  Skin:    General: Skin is warm and dry.  Neurological:     Mental Status: He is alert.  Psychiatric:        Mood and Affect:  Mood normal.        Behavior: Behavior normal.     ED Results / Procedures / Treatments   Labs (all labs ordered are listed, but only abnormal results are displayed) Labs Reviewed - No data to display  EKG None  Radiology No results found.  Procedures Procedures    Medications Ordered in ED Medications  risperiDONE (RISPERDAL) tablet 1 mg (1 mg Oral Given 02/08/23 2238)  acetaminophen (TYLENOL) tablet 1,000 mg (1,000 mg Oral Given 02/08/23 2238)    ED Course/ Medical Decision Making/ A&P                             Medical Decision Making Risk OTC drugs. Prescription drug management.   This patient is a 37 y.o. male who presents to the ED for concern of medication administration. Requesting home risperidone for hallucinations and tylenol for arm pain.    Past Medical History / Social History / Additional history: Chart reviewed. Pertinent results include: mental disorder, PE, HLD, homelessness, paranoid behavior, schizophrenia  90 ER visits in past 6 months  Physical Exam: Physical exam performed. The pertinent findings include: Normal vitals, no distress  Medications / Treatment: Given risperidone and tylenol   Disposition: After consideration of the diagnostic  results and the patients response to treatment, I feel that patient can discharge to home with [facility] follow up.  Final Clinical Impression(s) / ED Diagnoses Final diagnoses:  Encounter for medication administration    Rx / DC Orders ED Discharge Orders     None      Portions of this report may have been transcribed using voice recognition software. Every effort was made to ensure accuracy; however, inadvertent computerized transcription errors may be present.    Estill Cotta 02/08/23 2241    Cristie Hem, MD 02/08/23 [phone]

## 2023-02-09 ENCOUNTER — Emergency Department (HOSPITAL_COMMUNITY)
Admission: EM | Admit: 2023-02-09 | Discharge: 2023-02-10 | Disposition: A | Discharge status: Home / Self Care | Payer: Self-pay | Attending: Emergency Medicine | Admitting: Emergency Medicine

## 2023-02-09 ENCOUNTER — Encounter (HOSPITAL_COMMUNITY): Payer: Self-pay

## 2023-02-09 DIAGNOSIS — R440 Auditory hallucinations: Secondary | ICD-10-CM | POA: Insufficient documentation

## 2023-02-09 DIAGNOSIS — Z8616 Personal history of COVID-19: Secondary | ICD-10-CM | POA: Insufficient documentation

## 2023-02-09 DIAGNOSIS — R443 Hallucinations, unspecified: Secondary | ICD-10-CM

## 2023-02-09 NOTE — ED Triage Notes (Signed)
Pt states that he wants to speak with behavorial health today, that his voices are getting voice despite taking his Risperdal, denies SI/HI

## 2023-02-10 ENCOUNTER — Emergency Department (HOSPITAL_COMMUNITY)
Admission: EM | Admit: 2023-02-10 | Discharge: 2023-02-10 | Disposition: A | Discharge status: Home / Self Care | Payer: Self-pay | Attending: Emergency Medicine | Admitting: Emergency Medicine

## 2023-02-10 ENCOUNTER — Other Ambulatory Visit: Payer: Self-pay

## 2023-02-10 ENCOUNTER — Encounter (HOSPITAL_COMMUNITY): Payer: Self-pay

## 2023-02-10 DIAGNOSIS — Z7689 Persons encountering health services in other specified circumstances: Secondary | ICD-10-CM

## 2023-02-10 DIAGNOSIS — Z760 Encounter for issue of repeat prescription: Secondary | ICD-10-CM | POA: Insufficient documentation

## 2023-02-10 MED ORDER — RISPERIDONE 0.5 MG PO TABS
1.0000 mg | ORAL_TABLET | Freq: Once | ORAL | Status: AC
  Administered 2023-02-10: 1 mg via ORAL
  Filled 2023-02-10: qty 2

## 2023-02-10 MED ORDER — ACETAMINOPHEN 325 MG PO TABS
650.0000 mg | ORAL_TABLET | Freq: Once | ORAL | Status: DC
  Filled 2023-02-10: qty 2

## 2023-02-10 NOTE — ED Triage Notes (Signed)
Request daily dose of Risperdal.   Also accidentally bumped his head on the wall. Wanting some tylenol for headache.

## 2023-02-10 NOTE — Discharge Instructions (Addendum)
You have been seen today for your complaint of medication administration. Look at the resource list that I have printed for you.  It is a list of social services to help you with chest rotation and medications.  At this time there does not appear to be the presence of an emergent medical condition, however there is always the potential for conditions to change. Please read and follow the below instructions.  Do not take your medicine if  develop an itchy rash, swelling in your mouth or lips, or difficulty breathing; call 911 and seek immediate emergency medical attention if this occurs.  You may review your lab tests and imaging results in their entirety on your MyChart account.  Please discuss all results of fully with your primary care provider and other specialist at your follow-up visit.  Note: Portions of this text may have been transcribed using voice recognition software. Every effort was made to ensure accuracy; however, inadvertent computerized transcription errors may still be present.

## 2023-02-10 NOTE — ED Provider Notes (Signed)
  [place] [facility] Provider Note   CSN: [id number] Arrival date & time: 02/09/23  2314     History  Chief Complaint  Patient presents with   Hallucinations    Cole Barrett is a 37 y.o. male.  The history is provided by the patient.   Patient with history of schizophrenia presents because he says he needs to see his behavioral health.  This is chronic auditory hallucinations are getting worse.  No SI or HI.  Reports he would like to take some Risperdal.   Past Medical History:  Diagnosis Date   COVID-19    Homelessness    Hypercholesterolemia    Mental disorder    Paranoid behavior ([place])    PE (pulmonary embolism)    Schizophrenia ([place])     Home Medications Prior to Admission medications   Medication Sig Start Date End Date Taking? Authorizing Provider  ibuprofen (ADVIL) 600 MG tablet Take 1 tablet (600 mg total) by mouth every 6 (six) hours as needed. Patient taking differently: Take 600 mg by mouth every 6 (six) hours as needed for mild pain. 12/24/22   Harris, Vernie Shanks, PA-C  risperiDONE (RISPERDAL M-TABS) 1 MG disintegrating tablet Take 1 tablet (1 mg total) by mouth 2 (two) times daily for 10 days. 12/24/22 01/07/23  Margarita Mail, PA-C  amantadine (SYMMETREL) 100 MG capsule Take 1 capsule (100 mg total) by mouth 2 (two) times daily. Patient not taking: Reported on 11/04/2019 09/01/19 11/17/19  Mallie Darting, NP      Allergies    Patient has no known allergies.    Review of Systems   Review of Systems  Physical Exam Updated Vital Signs BP 102/72 (BP Location: Left Arm)   Pulse 86   Temp 98.2 F (36.8 C) (Oral)   Resp 16   SpO2 97%  Physical Exam CONSTITUTIONAL: Disheveled, no acute distress, pleasant HEAD: Normocephalic/atraumatic EYES: EOMI NEURO: Pt is resting comfortably, no acute distress.  Moves all extremities x 4 SKIN: warm, color normal PSYCH: no abnormalities of mood noted, alert and oriented  to situation  ED Results / Procedures / Treatments   Labs (all labs ordered are listed, but only abnormal results are displayed) Labs Reviewed - No data to display  EKG None  Radiology No results found.  Procedures Procedures    Medications Ordered in ED Medications  risperiDONE (RISPERDAL) tablet 1 mg (has no administration in time range)    ED Course/ Medical Decision Making/ A&P                             Medical Decision Making Risk Prescription drug management.   Patient presents for his 91st ER visit in 6 months.  He was seen walk around the ER in no distress.  He does not appear to have an acute psychosis at this time.  Advised to go to behavioral health urgent care 24 hours a day        Final Clinical Impression(s) / ED Diagnoses Final diagnoses:  Hallucinations    Rx / DC Orders ED Discharge Orders     None         Ripley Fraise, MD 02/10/23 0003

## 2023-02-10 NOTE — ED Provider Notes (Signed)
[facility] Provider Note   CSN: [id number] Arrival date & time: 02/10/23  2105     History  Chief Complaint  Patient presents with   Medication Refill    Cole Barrett is a 37 y.o. male.  Presenting to the ED requesting his home dose of respite all and Tylenol for right shoulder pain.  States that he ran into a wall a while ago and has had pain in his right shoulder since then.  He has had greater than 90 visits to the ED in the past 6 months for same complaints.  Denies numbness, weakness or tingling of bilateral upper extremities.  He has auditory hallucinations at baseline.  Denies changes to this.  Denies SI or HI.  States his medications are at home but he is unable to get into his house because he has to be in this area for work tomorrow morning.   Medication Refill      Home Medications Prior to Admission medications   Medication Sig Start Date End Date Taking? Authorizing Provider  ibuprofen (ADVIL) 600 MG tablet Take 1 tablet (600 mg total) by mouth every 6 (six) hours as needed. Patient taking differently: Take 600 mg by mouth every 6 (six) hours as needed for mild pain. 12/24/22   Harris, Vernie Shanks, PA-C  risperiDONE (RISPERDAL M-TABS) 1 MG disintegrating tablet Take 1 tablet (1 mg total) by mouth 2 (two) times daily for 10 days. 12/24/22 01/07/23  Margarita Mail, PA-C  amantadine (SYMMETREL) 100 MG capsule Take 1 capsule (100 mg total) by mouth 2 (two) times daily. Patient not taking: Reported on 11/04/2019 09/01/19 11/17/19  Mallie Darting, NP      Allergies    Patient has no known allergies.    Review of Systems   Review of Systems  All other systems reviewed and are negative.   Physical Exam Updated Vital Signs BP 124/82 (BP Location: Left Arm)   Pulse 88   Temp 98.3 F (36.8 C) (Oral)   Resp 18   SpO2 98%  Physical Exam Vitals and nursing note reviewed.  Constitutional:      General: He is not in  acute distress.    Appearance: Normal appearance. He is normal weight. He is not ill-appearing.     Comments: Disheveled  HENT:     Head: Normocephalic and atraumatic.  Pulmonary:     Effort: Pulmonary effort is normal. No respiratory distress.  Abdominal:     General: Abdomen is flat.  Musculoskeletal:        General: Normal range of motion.     Cervical back: Neck supple.  Skin:    General: Skin is warm and dry.  Neurological:     Mental Status: He is alert and oriented to person, place, and time.  Psychiatric:        Mood and Affect: Mood normal.        Behavior: Behavior normal.     ED Results / Procedures / Treatments   Labs (all labs ordered are listed, but only abnormal results are displayed) Labs Reviewed - No data to display  EKG None  Radiology No results found.  Procedures Procedures    Medications Ordered in ED Medications  acetaminophen (TYLENOL) tablet 650 mg (has no administration in time range)    ED Course/ Medical Decision Making/ A&P  Medical Decision Making This patient presents to the ED for concern of requesting home medications  Co morbidities that complicate the patient evaluation   schizoaffective disorder  Additional history obtained from: Nursing notes from this visit. Previous records within EMR system greater than 90 ED visits over the past 6 months  Afebrile, hemodynamically stable.  37 year old male presenting to the ED requesting his home dose of medications.  He is here very frequently for the same.  Reports that time that he is unable to get home to take his medications.  Denies changes to his symptoms.  Denies numbness, weakness, tingling of the bilateral upper extremities.  Denies new injuries.  Was given Tylenol in the ED.  Encouraged to return to his house to take his home medications.  Obvious aspect of malingering present.  Stable at discharge.  At this time there does not appear to be any  evidence of an acute emergency medical condition and the patient appears stable for discharge with appropriate outpatient follow up. Diagnosis was discussed with patient who verbalizes understanding of care plan and is agreeable to discharge. I have discussed return precautions with patient  who verbalizes understanding. Patient encouraged to follow-up with their PCP within 1 week. All questions answered.  Note: Portions of this report may have been transcribed using voice recognition software. Every effort was made to ensure accuracy; however, inadvertent computerized transcription errors may still be present.        Final Clinical Impression(s) / ED Diagnoses Final diagnoses:  Encounter for medication administration    Rx / DC Orders ED Discharge Orders     None         Nehemiah Massed 02/10/23 2142    Fransico Meadow, MD 02/15/23 1600

## 2023-02-11 ENCOUNTER — Emergency Department (HOSPITAL_COMMUNITY)
Admission: EM | Admit: 2023-02-11 | Discharge: 2023-02-11 | Disposition: A | Discharge status: Home / Self Care | Payer: Self-pay | Attending: Emergency Medicine | Admitting: Emergency Medicine

## 2023-02-11 ENCOUNTER — Other Ambulatory Visit: Payer: Self-pay

## 2023-02-11 ENCOUNTER — Encounter (HOSPITAL_COMMUNITY): Payer: Self-pay | Admitting: Emergency Medicine

## 2023-02-11 DIAGNOSIS — M79601 Pain in right arm: Secondary | ICD-10-CM | POA: Insufficient documentation

## 2023-02-11 MED ORDER — ACETAMINOPHEN 500 MG PO TABS
1000.0000 mg | ORAL_TABLET | Freq: Once | ORAL | Status: AC
  Administered 2023-02-11: 1000 mg via ORAL
  Filled 2023-02-11: qty 2

## 2023-02-11 NOTE — ED Provider Notes (Signed)
[facility] Provider Note   CSN: [id number] Arrival date & time: 02/11/23  [id number]     History  Chief Complaint  Patient presents with   Arm Pain    Noell Fresch is a 37 y.o. male.  The history is provided by the patient and medical records.  Arm Pain   37 y.o. M here with right arm pain.  States he injured it a few months ago, thinks he aggravated it at work again yesterday evening.  He is requesting additional pain medication.  He denies any other acute complaints.  Home Medications Prior to Admission medications   Medication Sig Start Date End Date Taking? Authorizing Provider  ibuprofen (ADVIL) 600 MG tablet Take 1 tablet (600 mg total) by mouth every 6 (six) hours as needed. Patient taking differently: Take 600 mg by mouth every 6 (six) hours as needed for mild pain. 12/24/22   Harris, Vernie Shanks, PA-C  risperiDONE (RISPERDAL M-TABS) 1 MG disintegrating tablet Take 1 tablet (1 mg total) by mouth 2 (two) times daily for 10 days. 12/24/22 01/07/23  Margarita Mail, PA-C  amantadine (SYMMETREL) 100 MG capsule Take 1 capsule (100 mg total) by mouth 2 (two) times daily. Patient not taking: Reported on 11/04/2019 09/01/19 11/17/19  Mallie Darting, NP      Allergies    Patient has no known allergies.    Review of Systems   Review of Systems  Musculoskeletal:  Positive for arthralgias.  All other systems reviewed and are negative.   Physical Exam Updated Vital Signs BP 126/74 (BP Location: Left Arm)   Pulse 80   Temp 98.6 F (37 C) (Oral)   Resp 18   Ht 5\' 10"  (1.778 m)   Wt 99.8 kg   SpO2 99%   BMI 31.57 kg/m   Physical Exam Vitals and nursing note reviewed.  Constitutional:      Appearance: He is well-developed.     Comments: Appears at baseline  HENT:     Head: Normocephalic and atraumatic.  Eyes:     Conjunctiva/sclera: Conjunctivae normal.     Pupils: Pupils are equal, round, and reactive to light.   Cardiovascular:     Rate and Rhythm: Normal rate and regular rhythm.     Heart sounds: Normal heart sounds.  Pulmonary:     Effort: Pulmonary effort is normal.     Breath sounds: Normal breath sounds.  Abdominal:     General: Bowel sounds are normal.     Palpations: Abdomen is soft.  Musculoskeletal:        General: Normal range of motion.     Cervical back: Normal range of motion.     Comments: Right arm without swelling or bony deformity, able to range wrist without difficulty, radial pulse intact  Skin:    General: Skin is warm and dry.  Neurological:     Mental Status: He is alert and oriented to person, place, and time.     ED Results / Procedures / Treatments   Labs (all labs ordered are listed, but only abnormal results are displayed) Labs Reviewed - No data to display  EKG None  Radiology No results found.  Procedures Procedures    Medications Ordered in ED Medications  acetaminophen (TYLENOL) tablet 1,000 mg (1,000 mg Oral Given 02/11/23 [id number])    ED Course/ Medical Decision Making/ A&P  Medical Decision Making Risk OTC drugs.   37 y.o. M here with right arm pain.  He is well known to the department for various complaints.  He appears at his baseline.  No deformity noted to the right arm.  Simply requesting something for pain which was given.  Stable for discharge.  Final Clinical Impression(s) / ED Diagnoses Final diagnoses:  Right arm pain    Rx / DC Orders ED Discharge Orders     None         Larene Pickett, PA-C 02/11/23 0610    Palumbo, April, MD 02/11/23 [phone]

## 2023-02-11 NOTE — ED Triage Notes (Signed)
Pt requests pain medication for right arm pain, states he hurt his arm at work x a couple of months ago and reports "hitting it on a wall"

## 2023-02-12 ENCOUNTER — Encounter (HOSPITAL_COMMUNITY): Payer: Self-pay

## 2023-02-12 ENCOUNTER — Emergency Department (HOSPITAL_COMMUNITY)
Admission: EM | Admit: 2023-02-12 | Discharge: 2023-02-12 | Disposition: A | Discharge status: Home / Self Care | Payer: Self-pay | Attending: Emergency Medicine | Admitting: Emergency Medicine

## 2023-02-12 ENCOUNTER — Other Ambulatory Visit: Payer: Self-pay

## 2023-02-12 DIAGNOSIS — M79601 Pain in right arm: Secondary | ICD-10-CM | POA: Insufficient documentation

## 2023-02-12 DIAGNOSIS — Y990 Civilian activity done for income or pay: Secondary | ICD-10-CM | POA: Insufficient documentation

## 2023-02-12 DIAGNOSIS — W2201XA Walked into wall, initial encounter: Secondary | ICD-10-CM | POA: Insufficient documentation

## 2023-02-12 MED ORDER — ACETAMINOPHEN 500 MG PO TABS
1000.0000 mg | ORAL_TABLET | Freq: Once | ORAL | Status: AC
  Administered 2023-02-12: 1000 mg via ORAL
  Filled 2023-02-12: qty 2

## 2023-02-12 NOTE — ED Provider Notes (Signed)
[place] EMERGENCY DEPARTMENT AT [facility] Provider Note   CSN: [id number] Arrival date & time: 02/12/23  0501     History  Chief Complaint  Patient presents with   Arm Pain    Cole Barrett is a 37 y.o. male.  The history is provided by the patient and medical records.  Arm Pain   37 y.o. M here with right arm pain after striking it against a wall yesterday at work.  I evaluated patient in the ED last evening or same.  He is well known to the department.  He is asking for something for pain.  He is also requesting sandwich and drink.  Home Medications Prior to Admission medications   Medication Sig Start Date End Date Taking? Authorizing Provider  ibuprofen (ADVIL) 600 MG tablet Take 1 tablet (600 mg total) by mouth every 6 (six) hours as needed. Patient taking differently: Take 600 mg by mouth every 6 (six) hours as needed for mild pain. 12/24/22   Harris, Vernie Shanks, PA-C  risperiDONE (RISPERDAL M-TABS) 1 MG disintegrating tablet Take 1 tablet (1 mg total) by mouth 2 (two) times daily for 10 days. 12/24/22 01/07/23  Margarita Mail, PA-C  amantadine (SYMMETREL) 100 MG capsule Take 1 capsule (100 mg total) by mouth 2 (two) times daily. Patient not taking: Reported on 11/04/2019 09/01/19 11/17/19  Mallie Darting, NP      Allergies    Patient has no known allergies.    Review of Systems   Review of Systems  Musculoskeletal:  Positive for arthralgias.  All other systems reviewed and are negative.   Physical Exam Updated Vital Signs BP 138/75 (BP Location: Left Arm)   Pulse 77   Temp 98.1 F (36.7 C) (Oral)   Resp 18   Ht 5\' 10"  (1.778 m)   Wt 99.8 kg   SpO2 100%   BMI 31.57 kg/m  Physical Exam Vitals and nursing note reviewed.  Constitutional:      Appearance: He is well-developed.  HENT:     Head: Normocephalic and atraumatic.  Eyes:     Conjunctiva/sclera: Conjunctivae normal.     Pupils: Pupils are equal, round, and reactive to light.   Cardiovascular:     Rate and Rhythm: Normal rate and regular rhythm.     Heart sounds: Normal heart sounds.  Pulmonary:     Effort: Pulmonary effort is normal.     Breath sounds: Normal breath sounds.  Abdominal:     General: Bowel sounds are normal.     Palpations: Abdomen is soft.  Musculoskeletal:        General: Normal range of motion.     Cervical back: Normal range of motion.     Comments: No deformity of the right arm, radial pulse intact  Skin:    General: Skin is warm and dry.  Neurological:     Mental Status: He is alert and oriented to person, place, and time.     ED Results / Procedures / Treatments   Labs (all labs ordered are listed, but only abnormal results are displayed) Labs Reviewed - No data to display  EKG None  Radiology No results found.  Procedures Procedures    Medications Ordered in ED Medications  acetaminophen (TYLENOL) tablet 1,000 mg (1,000 mg Oral Given 02/12/23 0545)    ED Course/ Medical Decision Making/ A&P  Medical Decision Making Risk OTC drugs.  37 y.o. M here with right arm pain after hitting it on a wall yesterday.  Seen last night for same as well.  No acute deformities on exam.  He appears at his baseline.  Given tylenol, meal and drink as requested.  Stable for discharge.  Final Clinical Impression(s) / ED Diagnoses Final diagnoses:  Right arm pain    Rx / DC Orders ED Discharge Orders     None         Larene Pickett, PA-C 02/12/23 0601    Palumbo, April, MD 02/12/23 [phone]

## 2023-02-12 NOTE — ED Triage Notes (Signed)
Patient endorses right arm pain, states he hit it against the wall yesterday at work. No deformity noted.

## 2023-02-13 ENCOUNTER — Emergency Department (HOSPITAL_COMMUNITY): Admission: EM | Admit: 2023-02-13 | Discharge: 2023-02-14 | Discharge status: Against Medical Advice | Payer: Self-pay

## 2023-02-13 NOTE — ED Notes (Signed)
Patient stated he wants to take a nap , refused to see MD at this time .

## 2023-02-13 NOTE — ED Notes (Signed)
Called PT 3x , didn't answer , also attempted to wake him up and no response

## 2023-02-13 NOTE — ED Notes (Signed)
PT was called and stated he would like to wait 10 mins before coming back to triage

## 2023-02-14 ENCOUNTER — Other Ambulatory Visit: Payer: Self-pay

## 2023-02-14 ENCOUNTER — Emergency Department (HOSPITAL_COMMUNITY)
Admission: EM | Admit: 2023-02-14 | Discharge: 2023-02-14 | Disposition: A | Discharge status: Home / Self Care | Payer: Self-pay | Attending: Emergency Medicine | Admitting: Emergency Medicine

## 2023-02-14 ENCOUNTER — Emergency Department (HOSPITAL_COMMUNITY)
Admission: EM | Admit: 2023-02-14 | Discharge: 2023-02-14 | Disposition: A | Discharge status: Home / Self Care | Payer: Self-pay | Attending: Student | Admitting: Student

## 2023-02-14 DIAGNOSIS — M79631 Pain in right forearm: Secondary | ICD-10-CM | POA: Insufficient documentation

## 2023-02-14 DIAGNOSIS — Z5900 Homelessness unspecified: Secondary | ICD-10-CM | POA: Insufficient documentation

## 2023-02-14 DIAGNOSIS — F17210 Nicotine dependence, cigarettes, uncomplicated: Secondary | ICD-10-CM | POA: Insufficient documentation

## 2023-02-14 DIAGNOSIS — W2201XA Walked into wall, initial encounter: Secondary | ICD-10-CM | POA: Insufficient documentation

## 2023-02-14 DIAGNOSIS — Z8616 Personal history of COVID-19: Secondary | ICD-10-CM | POA: Insufficient documentation

## 2023-02-14 DIAGNOSIS — M79601 Pain in right arm: Secondary | ICD-10-CM | POA: Insufficient documentation

## 2023-02-14 MED ORDER — LIDOCAINE 5 % EX PTCH
1.0000 | MEDICATED_PATCH | CUTANEOUS | Status: DC
  Administered 2023-02-14: 1 via TRANSDERMAL
  Filled 2023-02-14: qty 1

## 2023-02-14 MED ORDER — ACETAMINOPHEN 325 MG PO TABS
650.0000 mg | ORAL_TABLET | Freq: Once | ORAL | Status: AC
  Administered 2023-02-14: 650 mg via ORAL
  Filled 2023-02-14: qty 2

## 2023-02-14 MED ORDER — ACETAMINOPHEN ER 650 MG PO TBCR: 650.0000 mg | EXTENDED_RELEASE_TABLET | Freq: Three times a day (TID) | ORAL | 0 refills | Status: DC | PRN

## 2023-02-14 MED ORDER — NAPROXEN 250 MG PO TABS
500.0000 mg | ORAL_TABLET | Freq: Once | ORAL | Status: AC
  Administered 2023-02-14: 500 mg via ORAL
  Filled 2023-02-14: qty 2

## 2023-02-14 MED ORDER — NAPROXEN 375 MG PO TABS: 375.0000 mg | ORAL_TABLET | Freq: Two times a day (BID) | ORAL | 0 refills | Status: DC

## 2023-02-14 NOTE — ED Provider Notes (Signed)
[place] Provider Note   CSN: [id number] Arrival date & time: 02/14/23  0518     History  Chief Complaint  Patient presents with   Arm Pain     Cole Barrett is a 37 y.o. male with a past medical history significant for history of PE, homelessness, alcohol abuse, schizoaffective disorder who presents to the ED due to persistent right arm pain x 1 week.  Patient notes he hit his arm against a wall 1 week ago.  Seen in the ED for the same.  Last seen on 3/17 for same complaint. patient is right-hand dominant.  Denies edema.  Patient is requesting pain medication.  No other injuries.  Denies numbness/tingling.  No right upper extremity weakness.   History obtained from patient and past medical records. No interpreter used during encounter.       Home Medications Prior to Admission medications   Medication Sig Start Date End Date Taking? Authorizing Provider  acetaminophen (TYLENOL 8 HOUR) 650 MG CR tablet Take 1 tablet (650 mg total) by mouth every 8 (eight) hours as needed for pain. 02/14/23  Yes Bartlomiej Jenkinson C, PA-C  ibuprofen (ADVIL) 600 MG tablet Take 1 tablet (600 mg total) by mouth every 6 (six) hours as needed. Patient taking differently: Take 600 mg by mouth every 6 (six) hours as needed for mild pain. 12/24/22   Harris, Vernie Shanks, PA-C  risperiDONE (RISPERDAL M-TABS) 1 MG disintegrating tablet Take 1 tablet (1 mg total) by mouth 2 (two) times daily for 10 days. 12/24/22 01/07/23  Margarita Mail, PA-C  amantadine (SYMMETREL) 100 MG capsule Take 1 capsule (100 mg total) by mouth 2 (two) times daily. Patient not taking: Reported on 11/04/2019 09/01/19 11/17/19  Mallie Darting, NP      Allergies    Patient has no known allergies.    Review of Systems   Review of Systems  Musculoskeletal:  Positive for arthralgias. Negative for joint swelling.  Neurological:  Negative for weakness and numbness.    Physical  Exam Updated Vital Signs BP 126/80 (BP Location: Left Arm)   Pulse 73   Temp 98 F (36.7 C)   Resp 17   SpO2 97%  Physical Exam Vitals and nursing note reviewed.  Constitutional:      General: He is not in acute distress.    Appearance: He is not ill-appearing.  HENT:     Head: Normocephalic.  Eyes:     Pupils: Pupils are equal, round, and reactive to light.  Cardiovascular:     Rate and Rhythm: Normal rate and regular rhythm.     Pulses: Normal pulses.     Heart sounds: Normal heart sounds. No murmur heard.    No friction rub. No gallop.  Pulmonary:     Effort: Pulmonary effort is normal.     Breath sounds: Normal breath sounds.  Abdominal:     General: Abdomen is flat. There is no distension.     Palpations: Abdomen is soft.     Tenderness: There is no abdominal tenderness. There is no guarding or rebound.  Musculoskeletal:        General: Normal range of motion.     Cervical back: Neck supple.     Comments: No deformity to right forearm.  Radial pulse intact.  Soft compartments.  Full range of motion of right elbow and wrist.  Skin:    General: Skin is warm and dry.  Neurological:  General: No focal deficit present.     Mental Status: He is alert.  Psychiatric:        Mood and Affect: Mood normal.        Behavior: Behavior normal.     ED Results / Procedures / Treatments   Labs (all labs ordered are listed, but only abnormal results are displayed) Labs Reviewed - No data to display  EKG None  Radiology No results found.  Procedures Procedures    Medications Ordered in ED Medications  acetaminophen (TYLENOL) tablet 650 mg (has no administration in time range)    ED Course/ Medical Decision Making/ A&P                             Medical Decision Making Amount and/or Complexity of Data Reviewed Radiology: ordered.  Risk OTC drugs.   37 year old male presents to the ED due to persistent right arm pain after hitting it against a wall 1  week ago.  Patient is right-hand dominant.  Patient is well-known to the ED for numerous complaints.  History of homelessness.  Patient has been seen twice for right arm pain over the past few days.  Upon arrival, vitals all within normal limits.  Patient in no acute distress.  Mild tenderness to mid right forearm.  No deformity.  No edema.  No evidence of infection.  Soft compartments.  Doubt compartment syndrome.  Patient has not had an x-ray of his right arm so will obtain one to rule out bony fractures even though my suspicion is lower.  Patient requesting pain medication.  Tylenol given.  7:37 AM Informed by RN that patient declined x-ray. Tylenol given. Patient stable for discharge. Homeless shelter information given at discharge. Strict ED precautions discussed with patient. Patient states understanding and agrees to plan. Patient discharged home in no acute distress and stable vitals  No PCP homelessness        Final Clinical Impression(s) / ED Diagnoses Final diagnoses:  Right arm pain    Rx / DC Orders ED Discharge Orders          Ordered    acetaminophen (TYLENOL 8 HOUR) 650 MG CR tablet  Every 8 hours PRN        02/14/23 0729              Suzy Bouchard, PA-C 02/14/23 0739    Blanchie Dessert, MD 02/14/23 [phone]

## 2023-02-14 NOTE — ED Triage Notes (Signed)
Patient reports right arm pain today , denies injury.

## 2023-02-14 NOTE — ED Provider Notes (Signed)
Cole Barrett  CSN: [id number] Arrival date & time: 02/14/23 2205  Chief Complaint(s) Arm Pain   HPI Cole Barrett is a 37 y.o. male with PMH homelessness, HLD, paranoia, previous PE, schizophrenia who presents emergency department for evaluation of right arm pain.  Patient was seen this morning for the same complaint and refused x-ray imaging at that time.  Received a dose of Tylenol and was ultimately discharged.  Patient states that he came back to the emergency department to "get more pain pills".  Denies additional trauma or reinjury to the arm.  Denies numbness, tingling, weakness of the upper extremity.   Past Medical History Past Medical History:  Diagnosis Date   COVID-19    Homelessness    Hypercholesterolemia    Mental disorder    Paranoid behavior ([place])    PE (pulmonary embolism)    Schizophrenia ([place])    Patient Active Problem List   Diagnosis Date Noted   Acute psychosis ([place]) 08/31/2019   Schizophrenia ([place]) 08/31/2019   Schizoaffective disorder, bipolar type ([place]) 01/19/2019   Alcohol abuse    Auditory hallucinations    Alcohol abuse with alcohol-induced mood disorder ([place]) 09/08/2016   Homelessness 09/03/2016   Person feigning illness 09/03/2016   Brief psychotic disorder ([place]) 08/29/2016   Cannabis use disorder, mild, abuse 08/29/2016   Pulmonary emboli ([place]) 07/13/2016   Adjustment disorder with emotional disturbance 03/12/2014   Pulmonary embolism and infarction ([place]) 01/02/2014   Acute left flank pain 01/02/2014   Pulmonary embolism, bilateral ([place]) 01/02/2014   Home Medication(s) Prior to Admission medications   Medication Sig Start Date End Date Taking? Authorizing Provider  naproxen (NAPROSYN) 375 MG tablet Take 1 tablet (375 mg total) by mouth 2 (two) times daily. 02/14/23  Yes Wajiha Versteeg, MD  acetaminophen (TYLENOL 8 HOUR) 650 MG CR tablet Take 1 tablet (650 mg total) by  mouth every 8 (eight) hours as needed for pain. 02/14/23   Suzy Bouchard, PA-C  ibuprofen (ADVIL) 600 MG tablet Take 1 tablet (600 mg total) by mouth every 6 (six) hours as needed. Patient taking differently: Take 600 mg by mouth every 6 (six) hours as needed for mild pain. 12/24/22   Harris, Vernie Shanks, PA-C  risperiDONE (RISPERDAL M-TABS) 1 MG disintegrating tablet Take 1 tablet (1 mg total) by mouth 2 (two) times daily for 10 days. 12/24/22 01/07/23  Margarita Mail, PA-C  amantadine (SYMMETREL) 100 MG capsule Take 1 capsule (100 mg total) by mouth 2 (two) times daily. Patient not taking: Reported on 11/04/2019 09/01/19 11/17/19  Mallie Darting, NP                                                                                                                                    Past Surgical History No past surgical history on file. Family History Family History  Problem Relation Age of Onset  Hypertension Mother     Social History Social History   Tobacco Use   Smoking status: Every Day    Packs/day: .5    Types: Cigarettes   Smokeless tobacco: Never  Vaping Use   Vaping Use: Never used  Substance Use Topics   Alcohol use: Yes    Comment: occ   Drug use: Not Currently    Types: Marijuana    Comment: Last used: 2 months ago UDS NA   Allergies Patient has no known allergies.  Review of Systems Review of Systems  Musculoskeletal:  Positive for arthralgias and myalgias.    Physical Exam Vital Signs  I have reviewed the triage vital signs BP 115/67 (BP Location: Left Arm)   Pulse 98   Temp 97.9 F (36.6 C) (Oral)   Resp 17   SpO2 99%   Physical Exam Constitutional:      General: He is not in acute distress.    Appearance: Normal appearance.  HENT:     Head: Normocephalic and atraumatic.     Nose: No congestion or rhinorrhea.  Eyes:     General:        Right eye: No discharge.        Left eye: No discharge.     Extraocular Movements: Extraocular movements  intact.     Pupils: Pupils are equal, round, and reactive to light.  Cardiovascular:     Rate and Rhythm: Normal rate and regular rhythm.     Heart sounds: No murmur heard. Pulmonary:     Effort: No respiratory distress.     Breath sounds: No wheezing or rales.  Abdominal:     General: There is no distension.     Tenderness: There is no abdominal tenderness.  Musculoskeletal:        General: Tenderness present. Normal range of motion.     Cervical back: Normal range of motion.  Skin:    General: Skin is warm and dry.  Neurological:     General: No focal deficit present.     Mental Status: He is alert.     ED Results and Treatments Labs (all labs ordered are listed, but only abnormal results are displayed) Labs Reviewed - No data to display                                                                                                                        Radiology No results found.  Pertinent labs & imaging results that were available during my care of the patient were reviewed by me and considered in my medical decision making (see MDM for details).  Medications Ordered in ED Medications  naproxen (NAPROSYN) tablet 500 mg (has no administration in time range)  lidocaine (LIDODERM) 5 % 1 patch (has no administration in time range)  Procedures Procedures  (including critical care time)  Medical Decision Making / ED Course   This patient presents to the ED for concern of arm pain, this involves an extensive number of treatment options, and is a complaint that carries with it a high risk of complications and morbidity.  The differential diagnosis includes musculoskeletal strain, ligamentous injury, fracture, contusion, hematoma  MDM: Patient seen the emergency room for evaluation of arm pain.  Physical exam with tenderness over the brachial  radialis on the right but patient has full range of motion, pulses intact.  Patient again refusing x-ray imaging today and was given a single dose of Naprosyn and Lidoderm patch.  He was then discharged with outpatient follow-up.  He does not meet inpatient criteria for admission.   Additional history obtained:  -External records from outside source obtained and reviewed including: Chart review including previous notes, labs, imaging, consultation notes    Medicines ordered and prescription drug management: Meds ordered this encounter  Medications   naproxen (NAPROSYN) tablet 500 mg   lidocaine (LIDODERM) 5 % 1 patch   naproxen (NAPROSYN) 375 MG tablet    Sig: Take 1 tablet (375 mg total) by mouth 2 (two) times daily.    Dispense:  20 tablet    Refill:  0    -I have reviewed the patients home medicines and have made adjustments as needed  Critical interventions none   Cardiac Monitoring: The patient was maintained on a cardiac monitor.  I personally viewed and interpreted the cardiac monitored which showed an underlying rhythm of: NSR  Social Determinants of Health:  Factors impacting patients care include: Currently homeless   Reevaluation: After the interventions noted above, I reevaluated the patient and found that they have :improved  Co morbidities that complicate the patient evaluation  Past Medical History:  Diagnosis Date   COVID-19    Homelessness    Hypercholesterolemia    Mental disorder    Paranoid behavior ([place])    PE (pulmonary embolism)    Schizophrenia ([place])       Dispostion: I considered admission for this patient, but at this time he does not meet inpatient criteria for admission he is safe for discharge with outpatient follow-up     Final Clinical Impression(s) / ED Diagnoses Final diagnoses:  Pain of right forearm     @PCDICTATION @    Charleigh Correnti, Debe Coder, MD 02/14/23 2307

## 2023-02-14 NOTE — Discharge Instructions (Addendum)
It was a pleasure taking care of you today. Continue to ice your right arm.  Take Tylenol or ibuprofen as needed for pain.  Please follow-up with PCP if symptoms do not improve over the next week.  Return to the ER for new or worsening symptoms.

## 2023-02-14 NOTE — ED Triage Notes (Signed)
Patient reports right forearm pain onset this week , denies injury , ambulatory/respirations unlabored.

## 2023-02-17 ENCOUNTER — Other Ambulatory Visit: Payer: Self-pay

## 2023-02-17 ENCOUNTER — Emergency Department (HOSPITAL_COMMUNITY)
Admission: EM | Admit: 2023-02-17 | Discharge: 2023-02-18 | Disposition: A | Discharge status: Home / Self Care | Payer: Self-pay | Attending: Emergency Medicine | Admitting: Emergency Medicine

## 2023-02-17 DIAGNOSIS — F200 Paranoid schizophrenia: Secondary | ICD-10-CM | POA: Insufficient documentation

## 2023-02-17 DIAGNOSIS — Y900 Blood alcohol level of less than 20 mg/100 ml: Secondary | ICD-10-CM | POA: Insufficient documentation

## 2023-02-17 DIAGNOSIS — Z5900 Homelessness unspecified: Secondary | ICD-10-CM | POA: Insufficient documentation

## 2023-02-17 DIAGNOSIS — R440 Auditory hallucinations: Secondary | ICD-10-CM

## 2023-02-17 LAB — CBC WITH DIFFERENTIAL/PLATELET
Abs Immature Granulocytes: 0.03 10*3/uL (ref 0.00–0.07)
Basophils Absolute: 0 10*3/uL (ref 0.0–0.1)
Basophils Relative: 1 %
Eosinophils Absolute: 0.3 10*3/uL (ref 0.0–0.5)
Eosinophils Relative: 5 %
HCT: 37.2 % — ABNORMAL LOW (ref 39.0–52.0)
Hemoglobin: 12 g/dL — ABNORMAL LOW (ref 13.0–17.0)
Immature Granulocytes: 1 %
Lymphocytes Relative: 37 %
Lymphs Abs: 1.9 10*3/uL (ref 0.7–4.0)
MCH: 28 pg (ref 26.0–34.0)
MCHC: 32.3 g/dL (ref 30.0–36.0)
MCV: 86.9 fL (ref 80.0–100.0)
Monocytes Absolute: 0.5 10*3/uL (ref 0.1–1.0)
Monocytes Relative: 9 %
Neutro Abs: 2.4 10*3/uL (ref 1.7–7.7)
Neutrophils Relative %: 47 %
Platelets: 304 10*3/uL (ref 150–400)
RBC: 4.28 MIL/uL (ref 4.22–5.81)
RDW: 12.8 % (ref 11.5–15.5)
WBC: 5 10*3/uL (ref 4.0–10.5)
nRBC: 0 % (ref 0.0–0.2)

## 2023-02-17 MED ORDER — ONDANSETRON HCL 4 MG PO TABS: 4.0000 mg | ORAL_TABLET | Freq: Four times a day (QID) | ORAL | Status: DC | PRN

## 2023-02-17 MED ORDER — RISPERIDONE 0.5 MG PO TABS
1.0000 mg | ORAL_TABLET | Freq: Once | ORAL | Status: AC
  Administered 2023-02-17: 1 mg via ORAL
  Filled 2023-02-17: qty 2

## 2023-02-17 MED ORDER — ACETAMINOPHEN 325 MG PO TABS: 650.0000 mg | ORAL_TABLET | Freq: Four times a day (QID) | ORAL | Status: DC | PRN

## 2023-02-17 MED ORDER — ONDANSETRON HCL 4 MG/2ML IJ SOLN: 4.0000 mg | Freq: Four times a day (QID) | INTRAMUSCULAR | Status: DC | PRN

## 2023-02-17 MED ORDER — ACETAMINOPHEN 650 MG RE SUPP: 650.0000 mg | Freq: Four times a day (QID) | RECTAL | Status: DC | PRN

## 2023-02-17 NOTE — ED Provider Notes (Signed)
  Physical Exam  BP (!) 143/84 (BP Location: Left Arm)   Pulse 89   Resp 15   SpO2 99%   Physical Exam  Procedures  Procedures  ED Course / MDM    Medical Decision Making Amount and/or Complexity of Data Reviewed Labs: ordered.  Risk OTC drugs. Prescription drug management.   Patient medically cleared for psychiatry evaluation.  Anticipate discharge home after this he is voluntary at this time.       Cole Barrett, [place] 02/18/23 0154    Fatima Blank, MD 02/18/23 [phone]

## 2023-02-17 NOTE — ED Provider Notes (Incomplete)
  Physical Exam  BP (!) 143/84 (BP Location: Left Arm)   Pulse 89   Resp 15   SpO2 99%   Physical Exam  Procedures  Procedures  ED Course / MDM    Medical Decision Making Amount and/or Complexity of Data Reviewed Labs: ordered.  Risk OTC drugs. Prescription drug management.   ***

## 2023-02-17 NOTE — ED Triage Notes (Signed)
Pt c/o hallucinations. Hearing voices telling him they are going to hurt him. Denies SI/HI.

## 2023-02-17 NOTE — ED Provider Notes (Signed)
[place] AT [facility] Provider Note   CSN: [id number] Arrival date & time: 02/17/23  2159     History  Chief Complaint  Patient presents with   Hallucinations    Cole Barrett is a 37 y.o. male with past medical history schizophrenia, homelessness, hyperlipidemia, previous PE presents to the ED complaining of auditory hallucinations.  Patient states that he has these chronically but they have been getting worse recently.  He reports that he takes Risperdal at home and has been adherent with his medication regimen.  He does state he did not take his nightly dose tonight and is requesting a dose while waiting in the ED.  He states the voices are telling him that they are "going to kill me."  He denies suicidal ideation, violent or homicidal ideation.  He denies any other current complaints today.  He states that he would like to have his mental health evaluated and talk to the behavioral health team about better chronic control of his hallucinations.      Home Medications Prior to Admission medications   Medication Sig Start Date End Date Taking? Authorizing Provider  acetaminophen (TYLENOL 8 HOUR) 650 MG CR tablet Take 1 tablet (650 mg total) by mouth every 8 (eight) hours as needed for pain. 02/14/23   Suzy Bouchard, PA-C  ibuprofen (ADVIL) 600 MG tablet Take 1 tablet (600 mg total) by mouth every 6 (six) hours as needed. Patient taking differently: Take 600 mg by mouth every 6 (six) hours as needed for mild pain. 12/24/22   Harris, Vernie Shanks, PA-C  naproxen (NAPROSYN) 375 MG tablet Take 1 tablet (375 mg total) by mouth 2 (two) times daily. 02/14/23   Kommor, Madison, MD  risperiDONE (RISPERDAL M-TABS) 1 MG disintegrating tablet Take 1 tablet (1 mg total) by mouth 2 (two) times daily for 10 days. 12/24/22 01/07/23  Margarita Mail, PA-C  amantadine (SYMMETREL) 100 MG capsule Take 1 capsule (100 mg total) by mouth 2 (two) times daily. Patient  not taking: Reported on 11/04/2019 09/01/19 11/17/19  Mallie Darting, NP      Allergies    Patient has no known allergies.    Review of Systems   Review of Systems  All other systems reviewed and are negative.   Physical Exam Updated Vital Signs BP (!) 143/84 (BP Location: Left Arm)   Pulse 89   Resp 15   SpO2 99%  Physical Exam Vitals and nursing note reviewed.  Constitutional:      General: He is not in acute distress.    Appearance: Normal appearance. He is not ill-appearing or toxic-appearing.  HENT:     Head: Normocephalic and atraumatic.     Mouth/Throat:     Mouth: Mucous membranes are moist.  Eyes:     Extraocular Movements: Extraocular movements intact.     Pupils: Pupils are equal, round, and reactive to light.  Cardiovascular:     Rate and Rhythm: Normal rate and regular rhythm.     Heart sounds: No murmur heard. Pulmonary:     Effort: Pulmonary effort is normal.     Breath sounds: Normal breath sounds.  Abdominal:     General: Abdomen is flat.     Palpations: Abdomen is soft.     Tenderness: There is no abdominal tenderness.  Musculoskeletal:        General: Normal range of motion.     Cervical back: Normal range of motion and neck supple.  Right lower leg: No edema.     Left lower leg: No edema.  Skin:    General: Skin is warm and dry.     Capillary Refill: Capillary refill takes less than 2 seconds.  Neurological:     General: No focal deficit present.     Mental Status: He is alert and oriented to person, place, and time.     Cranial Nerves: No cranial nerve deficit.     Motor: No weakness.  Psychiatric:        Attention and Perception: He perceives auditory hallucinations.        Mood and Affect: Affect is flat.        Speech: He is communicative. Speech is delayed (slightly). Speech is not rapid and pressured, slurred or tangential.        Behavior: Behavior is not agitated, slowed, aggressive, withdrawn, hyperactive or combative. Behavior is  cooperative.        Thought Content: Thought content is paranoid. Thought content does not include homicidal or suicidal ideation. Thought content does not include homicidal or suicidal plan.     ED Results / Procedures / Treatments   Labs (all labs ordered are listed, but only abnormal results are displayed) Labs Reviewed  CBC WITH DIFFERENTIAL/PLATELET - Abnormal; Notable for the following components:      Result Value   Hemoglobin 12.0 (*)    HCT 37.2 (*)    All other components within normal limits  COMPREHENSIVE METABOLIC PANEL  ETHANOL  RAPID URINE DRUG SCREEN, HOSP PERFORMED  SALICYLATE LEVEL  ACETAMINOPHEN LEVEL  URINALYSIS, ROUTINE W REFLEX MICROSCOPIC    EKG None  Radiology No results found.  Procedures Procedures    Medications Ordered in ED Medications  risperiDONE (RISPERDAL) tablet 1 mg (1 mg Oral Given 02/17/23 2250)    ED Course/ Medical Decision Making/ A&P                             Medical Decision Making Amount and/or Complexity of Data Reviewed Labs: ordered. Radiology: ordered. ECG/medicine tests: ordered.   Medical Decision Making:   Cole Barrett is a 37 y.o. male who presented to the ED today with auditory hallucinations detailed above.    External chart has been reviewed including recent ED visits. Complete initial physical exam performed, notably the patient was calm, cooperative, expressing auditory hallucinations but denying suicidal, violent, or homicidal ideation.    Reviewed and confirmed nursing documentation for past medical history, family history, social history.    Initial Assessment:   With the patient's presentation of auditory  hallucinations, most likely diagnosis is decompensated schizophrenia. Differential diagnosis includes but is not limited to schizoaffective disorder, major depressive disorder, anxiety disorder, personality disorder, malingering, alcohol abuse, polysubstance abuse, housing instability,  metabolic disturbance, acute infection, encephalopathy.  This is most consistent with an acute complicated illness  Initial Plan:  Screening labs including CBC and Metabolic panel to evaluate for infectious or metabolic etiology of disease.  Urinalysis with reflex culture ordered to evaluate for UTI or relevant urologic/nephrologic pathology.  Acetaminophen, salicylate, ethanol levels and urine drug screen EKG to evaluate for cardiac pathology Dose of home medication, Risperdal TTS consult once medically cleared Objective evaluation as reviewed   Final Assessment and Plan:   This is a 37 year old male with PMH paranoid schizophrenia presenting to ED c/o auditory hallucinations. Pt states he has chronic hallucinations but they have been getting worse and  are "telling me they are going to kill me." He denies suicidal or homicidal ideation. He is calm, cooperative on exam but does have a flat affect. Not obviously responding to internal stimuli. He does have history of frequent ED visits for chronic symptoms. Do question malingering but with report of worsening hallucinations will medically clear and have behavioral health team consult as pt requested. Workup ordered as above for medical clearance. Care transitioned to oncoming PA-C, Pati Gallo, pending medical clearance, TTS consult, disposition. Pt stable at time of care transition. No indication for IVC at this time.    Clinical Impression:  1. Paranoid schizophrenia ([place])   2. Auditory hallucination      Data Unavailable           Final Clinical Impression(s) / ED Diagnoses Final diagnoses:  Paranoid schizophrenia [facility])  Auditory hallucination    Rx / DC Orders ED Discharge Orders     None         Suzzette Righter, PA-C 02/17/23 2350    Drenda Freeze, MD 02/18/23 [phone]

## 2023-02-18 ENCOUNTER — Encounter (HOSPITAL_COMMUNITY): Payer: Self-pay | Admitting: Emergency Medicine

## 2023-02-18 ENCOUNTER — Other Ambulatory Visit: Payer: Self-pay

## 2023-02-18 ENCOUNTER — Emergency Department (HOSPITAL_COMMUNITY)
Admission: EM | Admit: 2023-02-18 | Discharge: 2023-02-19 | Disposition: A | Discharge status: Home / Self Care | Payer: Self-pay | Attending: Emergency Medicine | Admitting: Emergency Medicine

## 2023-02-18 DIAGNOSIS — R440 Auditory hallucinations: Secondary | ICD-10-CM | POA: Insufficient documentation

## 2023-02-18 LAB — COMPREHENSIVE METABOLIC PANEL
ALT: 36 U/L (ref 0–44)
AST: 24 U/L (ref 15–41)
Albumin: 3.8 g/dL (ref 3.5–5.0)
Alkaline Phosphatase: 41 U/L (ref 38–126)
Anion gap: 7 (ref 5–15)
BUN: 16 mg/dL (ref 6–20)
CO2: 23 mmol/L (ref 22–32)
Calcium: 8.6 mg/dL — ABNORMAL LOW (ref 8.9–10.3)
Chloride: 106 mmol/L (ref 98–111)
Creatinine, Ser: 1.08 mg/dL (ref 0.61–1.24)
GFR, Estimated: >60 mL/min (ref >60)
Glucose, Bld: 99 mg/dL (ref 70–99)
Potassium: 3.9 mmol/L (ref 3.5–5.1)
Sodium: 136 mmol/L (ref 135–145)
Total Bilirubin: 0.6 mg/dL (ref 0.3–1.2)
Total Protein: 7.5 g/dL (ref 6.5–8.1)

## 2023-02-18 LAB — ETHANOL: Alcohol, Ethyl (B): <10 mg/dL (ref <10)

## 2023-02-18 LAB — SALICYLATE LEVEL: Salicylate Lvl: <7 mg/dL — ABNORMAL LOW (ref 7.0–30.0)

## 2023-02-18 LAB — ACETAMINOPHEN LEVEL: Acetaminophen (Tylenol), Serum: <10 ug/mL — ABNORMAL LOW (ref 10–30)

## 2023-02-18 MED ORDER — RISPERIDONE 1 MG PO TABS
1.0000 mg | ORAL_TABLET | Freq: Once | ORAL | Status: AC
  Administered 2023-02-18: 1 mg via ORAL
  Filled 2023-02-18: qty 1

## 2023-02-18 MED ORDER — ACETAMINOPHEN 500 MG PO TABS
1000.0000 mg | ORAL_TABLET | ORAL | Status: AC
  Administered 2023-02-18: 1000 mg via ORAL
  Filled 2023-02-18: qty 2

## 2023-02-18 NOTE — ED Provider Notes (Signed)
Emergency Medicine Observation Re-evaluation Note  Cole Barrett is a 37 y.o. male, seen on rounds today.  Pt initially presented to the ED for complaints of Hallucinations Currently, the patient is sleeping, no distress.  Physical Exam  BP (!) 143/84 (BP Location: Left Arm)   Pulse 89   Temp 98 F (36.7 C) (Oral)   Resp 15   SpO2 99%  Physical Exam General: sleeping on floor Cardiac: well perfused Lungs: no distress Psych: calm  ED Course / MDM  EKG:   I have reviewed the labs performed to date as well as medications administered while in observation.  Recent changes in the last 24 hours include awaiting TTS evaluation.  Plan  Current plan is for awaiting TTS evaluation.    Ezequiel Essex, MD 02/18/23 [phone]

## 2023-02-18 NOTE — ED Notes (Signed)
Pt refused hospital stretcher. Currently sleeping on the floor.

## 2023-02-18 NOTE — ED Provider Notes (Signed)
[place] Provider Note   CSN: [id number] Arrival date & time: 02/18/23  2050     History  Chief Complaint  Patient presents with   Medical Clearance    Cole Barrett is a 37 y.o. male.  HPI  Patient is a 37 year old male who is well-known to the department was last seen at [facility] yesterday and stayed overnight because he was feeling more agitated felt like his chronic auditory hallucinations were worse than usual.  He was voluntary overnight and then opted to leave last night because he felt better.  He returns again today and states that he feels that his auditory hallucinations are worse and states that he feels that he needs to "rest my head"      Home Medications Prior to Admission medications   Medication Sig Start Date End Date Taking? Authorizing Provider  acetaminophen (TYLENOL 8 HOUR) 650 MG CR tablet Take 1 tablet (650 mg total) by mouth every 8 (eight) hours as needed for pain. 02/14/23   Suzy Bouchard, PA-C  ibuprofen (ADVIL) 600 MG tablet Take 1 tablet (600 mg total) by mouth every 6 (six) hours as needed. Patient taking differently: Take 600 mg by mouth every 6 (six) hours as needed for mild pain. 12/24/22   Harris, Vernie Shanks, PA-C  naproxen (NAPROSYN) 375 MG tablet Take 1 tablet (375 mg total) by mouth 2 (two) times daily. 02/14/23   Kommor, Madison, MD  risperiDONE (RISPERDAL M-TABS) 1 MG disintegrating tablet Take 1 tablet (1 mg total) by mouth 2 (two) times daily for 10 days. 12/24/22 01/07/23  Margarita Mail, PA-C  amantadine (SYMMETREL) 100 MG capsule Take 1 capsule (100 mg total) by mouth 2 (two) times daily. Patient not taking: Reported on 11/04/2019 09/01/19 11/17/19  Mallie Darting, NP      Allergies    Patient has no known allergies.    Review of Systems   Review of Systems  Physical Exam Updated Vital Signs BP 118/76   Pulse 100   Temp 99.1 F (37.3 C)   Resp 16   SpO2 98%   Physical Exam Vitals and nursing note reviewed.  Constitutional:      General: He is not in acute distress.    Comments: Pleasant 37 year old male in no acute distress asleep in chair on my arrival awakens to verbal stimulus.  Oriented and answers questions appropriately and follows commands  HENT:     Head: Normocephalic and atraumatic.     Nose: Nose normal.     Mouth/Throat:     Mouth: Mucous membranes are moist.  Eyes:     General: No scleral icterus.    Comments: Bilateral injected sclera  Cardiovascular:     Rate and Rhythm: Normal rate and regular rhythm.     Pulses: Normal pulses.     Heart sounds: Normal heart sounds.  Pulmonary:     Effort: Pulmonary effort is normal.  Abdominal:     General: Abdomen is flat.  Musculoskeletal:     Cervical back: Normal range of motion.     Right lower leg: No edema.     Left lower leg: No edema.  Skin:    General: Skin is warm and dry.     Capillary Refill: Capillary refill takes less than 2 seconds.  Neurological:     Mental Status: He is alert. Mental status is at baseline.  Psychiatric:        Mood and Affect: Mood  normal.        Behavior: Behavior normal.     Comments: Euthymic, laughs during conversation Denies SI,HI     ED Results / Procedures / Treatments   Labs (all labs ordered are listed, but only abnormal results are displayed) Labs Reviewed - No data to display  EKG None  Radiology No results found.  Procedures Procedures    Medications Ordered in ED Medications  acetaminophen (TYLENOL) tablet 1,000 mg (1,000 mg Oral Given 02/18/23 2333)  risperiDONE (RISPERDAL) tablet 1 mg (1 mg Oral Given 02/18/23 2333)    ED Course/ Medical Decision Making/ A&P                             Medical Decision Making Risk OTC drugs. Prescription drug management.   Patient is a 37 year old male who is well-known to the department was last seen at [facility] yesterday and stayed overnight because he was feeling  more agitated felt like his chronic auditory hallucinations were worse than usual.  He was voluntary overnight and then opted to leave last night because he felt better.  He returns again today and states that he feels that his auditory hallucinations are worse and states that he feels that he needs to "rest my head"  Physical exam unremarkable  Patient seems euthymic labs occasionally during our exchange.  He is in no acute distress.  He states he is tired.  He denies SI, HI, endorses continued chronic auditory hallucinations.  He has not taken his Risperdal today.  I provided him with 1 dose of Risperdal here and a dose of Tylenol for some bodyaches he is experiencing and encouraged hydration p.o.  On reevaluation patient states that he feels well.  He agrees my plan to discharge him home to follow-up with outpatient psychiatry.  Final Clinical Impression(s) / ED Diagnoses Final diagnoses:  None    Rx / DC Orders ED Discharge Orders     None         Tedd Sias, [place] [id number]    Delora Fuel, [place] [id number] [phone]

## 2023-02-18 NOTE — ED Provider Notes (Incomplete)
[place] Provider Note   CSN: [id number] Arrival date & time: 02/18/23  2050     History {Add pertinent medical, surgical, social history, OB history to HPI:1} Chief Complaint  Patient presents with  . Medical Clearance    Cole Barrett is a 37 y.o. male.  HPI  Patient is a 37 year old male who is well-known to the department was last seen at [facility] yesterday and stayed overnight because he was feeling more agitated felt like his chronic auditory hallucinations were worse than usual.  He was voluntary overnight and then opted to leave last night because he felt better.  He returns again today and states that he feels that his auditory hallucinations are worse and states that he feels that he needs to "rest my head"         Home Medications Prior to Admission medications   Medication Sig Start Date End Date Taking? Authorizing Provider  acetaminophen (TYLENOL 8 HOUR) 650 MG CR tablet Take 1 tablet (650 mg total) by mouth every 8 (eight) hours as needed for pain. 02/14/23   Suzy Bouchard, PA-C  ibuprofen (ADVIL) 600 MG tablet Take 1 tablet (600 mg total) by mouth every 6 (six) hours as needed. Patient taking differently: Take 600 mg by mouth every 6 (six) hours as needed for mild pain. 12/24/22   Harris, Vernie Shanks, PA-C  naproxen (NAPROSYN) 375 MG tablet Take 1 tablet (375 mg total) by mouth 2 (two) times daily. 02/14/23   Kommor, Madison, MD  risperiDONE (RISPERDAL M-TABS) 1 MG disintegrating tablet Take 1 tablet (1 mg total) by mouth 2 (two) times daily for 10 days. 12/24/22 01/07/23  Margarita Mail, PA-C  amantadine (SYMMETREL) 100 MG capsule Take 1 capsule (100 mg total) by mouth 2 (two) times daily. Patient not taking: Reported on 11/04/2019 09/01/19 11/17/19  Mallie Darting, NP      Allergies    Patient has no known allergies.    Review of Systems   Review of Systems  Physical Exam Updated Vital Signs BP  118/76   Pulse 100   Temp 99.1 F (37.3 C)   Resp 16   SpO2 98%  Physical Exam  ED Results / Procedures / Treatments   Labs (all labs ordered are listed, but only abnormal results are displayed) Labs Reviewed - No data to display  EKG None  Radiology No results found.  Procedures Procedures  {Document cardiac monitor, telemetry assessment procedure when appropriate:1}  Medications Ordered in ED Medications  acetaminophen (TYLENOL) tablet 1,000 mg (has no administration in time range)  risperiDONE (RISPERDAL) tablet 1 mg (has no administration in time range)    ED Course/ Medical Decision Making/ A&P   {   Click here for ABCD2, HEART and other calculatorsREFRESH Note before signing :1}                          Medical Decision Making Risk OTC drugs. Prescription drug management.   ***  {Document critical care time when appropriate:1} {Document review of labs and clinical decision tools ie heart score, Chads2Vasc2 etc:1}  {Document your independent review of radiology images, and any outside records:1} {Document your discussion with family members, caretakers, and with consultants:1} {Document social determinants of health affecting pt's care:1} {Document your decision making why or why not admission, treatments were needed:1} Final Clinical Impression(s) / ED Diagnoses Final diagnoses:  None    Rx / DC  Orders ED Discharge Orders     None

## 2023-02-18 NOTE — ED Provider Notes (Signed)
Patient feeling better and requesting discharge.  He states the voices are better and under control and back to his baseline.  Denies thoughts of wanting to hurt himself or anyone else.  States he had his medications available at his house.  No indication for emergent psychiatric evaluation.  BP 131/84 (BP Location: Left Arm)   Pulse 74   Temp (!) 97.4 F (36.3 C) (Oral)   Resp 16   SpO2 98%     Ezequiel Essex, MD 02/18/23 1027

## 2023-02-18 NOTE — Discharge Instructions (Signed)
Take your medications as prescribed and follow-up with your doctor.  Return to the ED with new or worsening symptoms.

## 2023-02-18 NOTE — ED Triage Notes (Signed)
Pt c/o hearing voices that are telling him that they are going to hurt him. Pt denies SI/HI

## 2023-02-18 NOTE — ED Notes (Signed)
Attempting to remove personal belongings from pt. Provided with personal belongings bag. Pt provided with breakfast. Sitter remains at bedside

## 2023-02-18 NOTE — ED Notes (Signed)
Pt arrives to room 7. Sitter at bedside

## 2023-02-19 NOTE — ED Notes (Signed)
Pt verbalizes understanding of discharge instructions. Opportunity for questions and answers were provided. Pt discharged from the ED.   ?

## 2023-02-20 ENCOUNTER — Other Ambulatory Visit: Payer: Self-pay

## 2023-02-20 ENCOUNTER — Encounter (HOSPITAL_COMMUNITY): Payer: Self-pay

## 2023-02-20 ENCOUNTER — Emergency Department (HOSPITAL_COMMUNITY)
Admission: EM | Admit: 2023-02-20 | Discharge: 2023-02-20 | Disposition: A | Discharge status: Home / Self Care | Payer: Self-pay | Attending: Emergency Medicine | Admitting: Emergency Medicine

## 2023-02-20 DIAGNOSIS — Z7689 Persons encountering health services in other specified circumstances: Secondary | ICD-10-CM

## 2023-02-20 DIAGNOSIS — Z5181 Encounter for therapeutic drug level monitoring: Secondary | ICD-10-CM | POA: Insufficient documentation

## 2023-02-20 MED ORDER — RISPERIDONE 1 MG PO TABS
1.0000 mg | ORAL_TABLET | Freq: Once | ORAL | Status: AC
  Administered 2023-02-20: 1 mg via ORAL
  Filled 2023-02-20: qty 1

## 2023-02-20 NOTE — ED Triage Notes (Signed)
Pt arrived POV stating he just need his risperidone that he thought he had it on him. Pt denies any pain, other complaints or any SI/HI issues this morning.

## 2023-02-20 NOTE — ED Provider Notes (Signed)
[place] Provider Note   CSN: [id number] Arrival date & time: 02/20/23  0417     History  Chief Complaint  Patient presents with   Medication Refill    Cole Barrett is a 37 y.o. male.  The history is provided by the patient and medical records.  Medication Refill Cole Barrett is a 37 y.o. male who presents to the Emergency Department complaining of medication.  He presents to the emergency department with request for a dose of his Risperdal.  He denies any SI, HI or recent illnesses.  He states that he does not have his medication on him and he needs to take it twice a day.      Home Medications Prior to Admission medications   Medication Sig Start Date End Date Taking? Authorizing Provider  acetaminophen (TYLENOL 8 HOUR) 650 MG CR tablet Take 1 tablet (650 mg total) by mouth every 8 (eight) hours as needed for pain. 02/14/23   Suzy Bouchard, PA-C  ibuprofen (ADVIL) 600 MG tablet Take 1 tablet (600 mg total) by mouth every 6 (six) hours as needed. Patient taking differently: Take 600 mg by mouth every 6 (six) hours as needed for mild pain. 12/24/22   Harris, Vernie Shanks, PA-C  naproxen (NAPROSYN) 375 MG tablet Take 1 tablet (375 mg total) by mouth 2 (two) times daily. 02/14/23   Kommor, Madison, MD  risperiDONE (RISPERDAL M-TABS) 1 MG disintegrating tablet Take 1 tablet (1 mg total) by mouth 2 (two) times daily for 10 days. 12/24/22 01/07/23  Margarita Mail, PA-C  amantadine (SYMMETREL) 100 MG capsule Take 1 capsule (100 mg total) by mouth 2 (two) times daily. Patient not taking: Reported on 11/04/2019 09/01/19 11/17/19  Mallie Darting, NP      Allergies    Patient has no known allergies.    Review of Systems   Review of Systems  All other systems reviewed and are negative.   Physical Exam Updated Vital Signs BP (!) 141/79 (BP Location: Right Arm)   Pulse 89   Temp 97.9 F (36.6 C) (Oral)   Resp 18    Ht 5\' 10"  (1.778 m)   Wt 99 kg   SpO2 99%   BMI 31.32 kg/m  Physical Exam Vitals and nursing note reviewed.  Constitutional:      Appearance: He is well-developed.  HENT:     Head: Normocephalic and atraumatic.  Cardiovascular:     Rate and Rhythm: Normal rate and regular rhythm.  Pulmonary:     Effort: Pulmonary effort is normal. No respiratory distress.  Musculoskeletal:        General: No tenderness.  Skin:    General: Skin is warm and dry.  Neurological:     Mental Status: He is alert and oriented to person, place, and time.  Psychiatric:     Comments: Flat affect     ED Results / Procedures / Treatments   Labs (all labs ordered are listed, but only abnormal results are displayed) Labs Reviewed - No data to display  EKG None  Radiology No results found.  Procedures Procedures    Medications Ordered in ED Medications  risperiDONE (RISPERDAL) tablet 1 mg (has no administration in time range)    ED Course/ Medical Decision Making/ A&P                             Medical Decision Making Risk  Prescription drug management.   Patient with history of schizophrenia here with request for a dose of his Risperdal.  He is not acutely psychotic or danger to himself or others.  Will provide a dose of his risperdal with plan for outpatient follow-up.        Final Clinical Impression(s) / ED Diagnoses Final diagnoses:  Encounter for medication administration    Rx / DC Orders ED Discharge Orders     None         Quintella Reichert, MD 02/20/23 0530

## 2023-02-23 ENCOUNTER — Encounter (HOSPITAL_COMMUNITY): Payer: Self-pay

## 2023-02-23 ENCOUNTER — Emergency Department (HOSPITAL_COMMUNITY)
Admission: EM | Admit: 2023-02-23 | Discharge: 2023-02-23 | Disposition: A | Discharge status: Home / Self Care | Payer: Self-pay | Attending: Emergency Medicine | Admitting: Emergency Medicine

## 2023-02-23 ENCOUNTER — Other Ambulatory Visit: Payer: Self-pay

## 2023-02-23 DIAGNOSIS — Z760 Encounter for issue of repeat prescription: Secondary | ICD-10-CM | POA: Insufficient documentation

## 2023-02-23 DIAGNOSIS — Z79899 Other long term (current) drug therapy: Secondary | ICD-10-CM

## 2023-02-23 DIAGNOSIS — R443 Hallucinations, unspecified: Secondary | ICD-10-CM | POA: Insufficient documentation

## 2023-02-23 HISTORY — DX: Hallucinations, unspecified: R44.3

## 2023-02-23 HISTORY — DX: Malingerer (conscious simulation): Z76.5

## 2023-02-23 MED ORDER — RISPERIDONE 0.5 MG PO TABS
1.0000 mg | ORAL_TABLET | Freq: Once | ORAL | Status: AC
  Administered 2023-02-23: 1 mg via ORAL
  Filled 2023-02-23: qty 2

## 2023-02-23 NOTE — ED Triage Notes (Signed)
Patient said he was his Risperidone medication and a sprite and sandwich. No other complaints.

## 2023-02-23 NOTE — ED Provider Notes (Signed)
[name] EMERGENCY DEPARTMENT AT [facility] Provider Note   CSN: [id number] Arrival date & time: 02/23/23  0740     History  Chief Complaint  Patient presents with   Medication Refill    Cole Barrett is a 37 y.o. male.  The history is provided by the patient and medical records. No language interpreter was used.  Medication Refill    37 year old male with significant history substance-induced mood disorder, schizoaffective disorder, homelessness, history of malingering, presenting to ED requesting for risperdal.  Please note this is patient's fifth ER visit within the past week for psych related illness.  Last ER visit was earlier today.  Patient states he would like to take the a risperdal as well as to have a Sprite and a sandwich.  No other complaint.  Home Medications Prior to Admission medications   Medication Sig Start Date End Date Taking? Authorizing Provider  acetaminophen (TYLENOL 8 HOUR) 650 MG CR tablet Take 1 tablet (650 mg total) by mouth every 8 (eight) hours as needed for pain. 02/14/23   Suzy Bouchard, PA-C  ibuprofen (ADVIL) 600 MG tablet Take 1 tablet (600 mg total) by mouth every 6 (six) hours as needed. Patient taking differently: Take 600 mg by mouth every 6 (six) hours as needed for mild pain. 12/24/22   Harris, Vernie Shanks, PA-C  naproxen (NAPROSYN) 375 MG tablet Take 1 tablet (375 mg total) by mouth 2 (two) times daily. 02/14/23   Kommor, Madison, MD  risperiDONE (RISPERDAL M-TABS) 1 MG disintegrating tablet Take 1 tablet (1 mg total) by mouth 2 (two) times daily for 10 days. 12/24/22 01/07/23  Margarita Mail, PA-C  amantadine (SYMMETREL) 100 MG capsule Take 1 capsule (100 mg total) by mouth 2 (two) times daily. Patient not taking: Reported on 11/04/2019 09/01/19 11/17/19  Mallie Darting, NP      Allergies    Patient has no known allergies.    Review of Systems   Review of Systems  All other systems reviewed and are  negative.   Physical Exam Updated Vital Signs Ht 5\' 10"  (1.778 m)   Wt 99 kg   BMI 31.32 kg/m  Physical Exam Vitals and nursing note reviewed.  Constitutional:      General: He is not in acute distress.    Appearance: He is well-developed.     Comments: Ambulating around the room appears to be in no acute discomfort  HENT:     Head: Atraumatic.  Eyes:     Conjunctiva/sclera: Conjunctivae normal.  Musculoskeletal:     Cervical back: Neck supple.  Skin:    Findings: No rash.  Neurological:     Mental Status: He is alert. Mental status is at baseline.     ED Results / Procedures / Treatments   Labs (all labs ordered are listed, but only abnormal results are displayed) Labs Reviewed - No data to display  EKG None  Radiology No results found.  Procedures Procedures    Medications Ordered in ED Medications  risperiDONE (RISPERDAL) tablet 1 mg (has no administration in time range)    ED Course/ Medical Decision Making/ A&P                             Medical Decision Making Risk Prescription drug management.   BP 130/75 (BP Location: Left Arm)   Pulse 83   Temp 98 F (36.7 C) (Oral)   Resp 18  Ht 5\' 10"  (1.778 m)   Wt 99 kg   SpO2 100%   BMI 31.32 kg/m   50:46 AM  37 year old male with significant history substance-induced mood disorder, schizoaffective disorder, homelessness, history of malingering, presenting to ED requesting for risperdal.  Please note this is patient's fifth ER visit within the past week for psych related illness.  Last ER visit was earlier today.  Patient states he would like to take the a risperdal as well as to have a Sprite and a sandwich.  No other complaint.  On exam, patient is overall in no acute discomfort.  He is ambulating the room requesting to receive a dose of his risperdal.  He does not appears to respond to internal stimuli.  He is making eye contact.  EMR review, patient has been seen multiple times for psychosis  or malingering.  At this time, I felt patient does not need any additional psychiatric workup.  Will provide him with his home medication and encourage patient to follow-up with his psychiatrist for further care.  He is stable for discharge.        Final Clinical Impression(s) / ED Diagnoses Final diagnoses:  Medication management    Rx / DC Orders ED Discharge Orders     None         Domenic Moras, PA-C 02/23/23 0805    Elgie Congo, MD 02/23/23 1505

## 2023-02-23 NOTE — ED Provider Notes (Signed)
[place] AT [facility] Provider Note   CSN: [id number] Arrival date & time: 02/23/23  0018     History  Chief Complaint  Patient presents with   Hallucinations    Cole Barrett is a 37 y.o. male.  The history is provided by the patient and medical records.   37 y.o. M with history of adjustment disorder, alcohol abuse, chronic auditory hallucinations, presenting to the ED with hallucinations.  Patient was found to be sleeping in the lobby and told he either needed to leave premises or check-in so he requested to be seen.  Denies any suicidal or homicidal ideation.  He admits "the weather is bad outside".  Patient is homeless, well-known to this facility for similar complaints.  Home Medications Prior to Admission medications   Medication Sig Start Date End Date Taking? Authorizing Provider  acetaminophen (TYLENOL 8 HOUR) 650 MG CR tablet Take 1 tablet (650 mg total) by mouth every 8 (eight) hours as needed for pain. 02/14/23   Suzy Bouchard, PA-C  ibuprofen (ADVIL) 600 MG tablet Take 1 tablet (600 mg total) by mouth every 6 (six) hours as needed. Patient taking differently: Take 600 mg by mouth every 6 (six) hours as needed for mild pain. 12/24/22   Harris, Vernie Shanks, PA-C  naproxen (NAPROSYN) 375 MG tablet Take 1 tablet (375 mg total) by mouth 2 (two) times daily. 02/14/23   Kommor, Madison, MD  risperiDONE (RISPERDAL M-TABS) 1 MG disintegrating tablet Take 1 tablet (1 mg total) by mouth 2 (two) times daily for 10 days. 12/24/22 01/07/23  Margarita Mail, PA-C  amantadine (SYMMETREL) 100 MG capsule Take 1 capsule (100 mg total) by mouth 2 (two) times daily. Patient not taking: Reported on 11/04/2019 09/01/19 11/17/19  Mallie Darting, NP      Allergies    Patient has no known allergies.    Review of Systems   Review of Systems  Psychiatric/Behavioral:  Positive for hallucinations.   All other systems reviewed and are  negative.   Physical Exam Updated Vital Signs BP 113/70 (BP Location: Left Arm)   Pulse 85   Temp 97.8 F (36.6 C) (Oral)   Resp 18   Ht 5\' 10"  (1.778 m)   Wt 99 kg   SpO2 99%   BMI 31.32 kg/m \  Physical Exam Vitals and nursing note reviewed.  Constitutional:      Appearance: He is well-developed.  HENT:     Head: Normocephalic and atraumatic.  Eyes:     Conjunctiva/sclera: Conjunctivae normal.     Pupils: Pupils are equal, round, and reactive to light.  Cardiovascular:     Rate and Rhythm: Normal rate and regular rhythm.     Heart sounds: Normal heart sounds.  Pulmonary:     Effort: Pulmonary effort is normal.     Breath sounds: Normal breath sounds.  Abdominal:     General: Bowel sounds are normal.     Palpations: Abdomen is soft.  Musculoskeletal:        General: Normal range of motion.     Cervical back: Normal range of motion.  Skin:    General: Skin is warm and dry.  Neurological:     Mental Status: He is alert and oriented to person, place, and time.  Psychiatric:     Comments: Denies SI/HI     ED Results / Procedures / Treatments   Labs (all labs ordered are listed, but only abnormal results are displayed) Labs  Reviewed - No data to display  EKG None  Radiology No results found.  Procedures Procedures    Medications Ordered in ED Medications  risperiDONE (RISPERDAL) tablet 1 mg (1 mg Oral Given 02/23/23 0123)    ED Course/ Medical Decision Making/ A&P                             Medical Decision Making Risk Prescription drug management.   37 year old male here for hallucinations.  This is a chronic issue for him.  He is currently homeless, very well-known to our hospital system.  He admits "the weather is bad out tonight".  He denies any SI/HI.  He appears at his baseline with stable vital signs.  Reports he has not had his Risperdal in a few days, dose was given here.  He appears stable for discharge.  Can return here for new  concerns.  Final Clinical Impression(s) / ED Diagnoses Final diagnoses:  Hallucinations    Rx / DC Orders ED Discharge Orders     None         Larene Pickett, PA-C [id number]    Delora Fuel, MD [id number] [phone]

## 2023-02-23 NOTE — ED Triage Notes (Signed)
Pt here for hallucinations, when asked about the voices he reports "they are after me". denies SI/HI, denies drugs use or ETOH tonight. Pt was asleep in [place] lobby, possible pt is malingering d/t weather. Pt denies any other complaints or concerns. Pt appears in good spirits, calm and cooperative.

## 2023-02-25 ENCOUNTER — Emergency Department (HOSPITAL_COMMUNITY)
Admission: EM | Admit: 2023-02-25 | Discharge: 2023-02-25 | Disposition: A | Discharge status: Home / Self Care | Payer: Self-pay | Attending: Emergency Medicine | Admitting: Emergency Medicine

## 2023-02-25 ENCOUNTER — Encounter (HOSPITAL_COMMUNITY): Payer: Self-pay

## 2023-02-25 DIAGNOSIS — Z5181 Encounter for therapeutic drug level monitoring: Secondary | ICD-10-CM | POA: Insufficient documentation

## 2023-02-25 DIAGNOSIS — R440 Auditory hallucinations: Secondary | ICD-10-CM | POA: Insufficient documentation

## 2023-02-25 DIAGNOSIS — Z79899 Other long term (current) drug therapy: Secondary | ICD-10-CM

## 2023-02-25 DIAGNOSIS — Z5900 Homelessness unspecified: Secondary | ICD-10-CM | POA: Insufficient documentation

## 2023-02-25 DIAGNOSIS — R441 Visual hallucinations: Secondary | ICD-10-CM | POA: Insufficient documentation

## 2023-02-25 MED ORDER — RISPERIDONE 0.5 MG PO TABS
1.0000 mg | ORAL_TABLET | Freq: Once | ORAL | Status: AC
  Administered 2023-02-25: 1 mg via ORAL
  Filled 2023-02-25: qty 2

## 2023-02-25 NOTE — ED Triage Notes (Signed)
Pt with multiple visit within the past month for "hallucinations", pt is wanting his risperidone and place to sleep. No other complaints.   Pt given sandwich tray and cheese with crackers upon request.

## 2023-02-25 NOTE — ED Provider Notes (Signed)
[place] Provider Note   CSN: [id number] Arrival date & time: 02/25/23  [id number]     History  Chief Complaint  Patient presents with   Malingering    Cole Barrett is a 37 y.o. male with past medical history significant for substance-induced mood disorder, schizoaffective disorder, homelessness, history of malingering, presents to the ED requesting a place to sleep, food, and Risperdal.  Patient is well-known to the ER and has had multiple visits within the past week all with psych related illness and request for respite all.  Last ER visit was 02/23/2023.  Patient has no other complaints.  He does have history of chronic hallucinations which are unchanged.   Home Medications Prior to Admission medications   Medication Sig Start Date End Date Taking? Authorizing Provider  acetaminophen (TYLENOL 8 HOUR) 650 MG CR tablet Take 1 tablet (650 mg total) by mouth every 8 (eight) hours as needed for pain. 02/14/23   Suzy Bouchard, PA-C  ibuprofen (ADVIL) 600 MG tablet Take 1 tablet (600 mg total) by mouth every 6 (six) hours as needed. Patient taking differently: Take 600 mg by mouth every 6 (six) hours as needed for mild pain. 12/24/22   Harris, Vernie Shanks, PA-C  naproxen (NAPROSYN) 375 MG tablet Take 1 tablet (375 mg total) by mouth 2 (two) times daily. 02/14/23   Kommor, Madison, MD  risperiDONE (RISPERDAL M-TABS) 1 MG disintegrating tablet Take 1 tablet (1 mg total) by mouth 2 (two) times daily for 10 days. 12/24/22 01/07/23  Margarita Mail, PA-C  amantadine (SYMMETREL) 100 MG capsule Take 1 capsule (100 mg total) by mouth 2 (two) times daily. Patient not taking: Reported on 11/04/2019 09/01/19 11/17/19  Mallie Darting, NP      Allergies    Patient has no known allergies.    Review of Systems   Review of Systems  Psychiatric/Behavioral:  Positive for hallucinations (chronic). Negative for agitation, behavioral problems, confusion and  suicidal ideas. The patient is not nervous/anxious.     Physical Exam Updated Vital Signs BP 127/78   Pulse 68   Temp 97.9 F (36.6 C) (Oral)   Resp 16   Ht 5\' 10"  (1.778 m)   Wt 99.8 kg   SpO2 99%   BMI 31.57 kg/m  Physical Exam Vitals and nursing note reviewed.  Constitutional:      General: He is not in acute distress.    Appearance: Normal appearance. He is not ill-appearing or diaphoretic.  Cardiovascular:     Rate and Rhythm: Normal rate and regular rhythm.  Pulmonary:     Effort: Pulmonary effort is normal.  Neurological:     Mental Status: He is alert and oriented to person, place, and time. Mental status is at baseline.  Psychiatric:        Attention and Perception: Attention normal. He perceives auditory (chronic) and visual (chronic) hallucinations.        Mood and Affect: Mood normal.        Speech: Speech normal.        Behavior: Behavior normal. Behavior is cooperative.     ED Results / Procedures / Treatments   Labs (all labs ordered are listed, but only abnormal results are displayed) Labs Reviewed - No data to display  EKG None  Radiology No results found.  Procedures Procedures    Medications Ordered in ED Medications  risperiDONE (RISPERDAL) tablet 1 mg (has no administration in time range)  ED Course/ Medical Decision Making/ A&P                             Medical Decision Making  Patient is a 37 year old male with history significant for substance-induced mood disorder, schizoaffective disorder, homelessness, history of malingering, presents to the ED requesting a place to sleep, food, and a dose of Risperdal.  Patient has chronic hallucinations and comes to the ED for medication.  He is well-known to this ER and hospital system with multiple visits for similar complaint.  He has no other subjective complaint, no SI, no HI.  On exam, patient is sitting upright in a chair, alert and oriented.  Patient does not appear to be in acute  discomfort.  He is wrapped in a blanket.  Patient is requesting dose of Resporal, a soda, and some food.  Patient does not appear to be responding to internal stimuli.  He makes eye contact.  Speech is normal.  Upon record review, patient has been seen multiple times for chronic hallucinations and/or malingering.  Patient was given his dose of respite all.  At this time, I feel that patient does not require any additional psychiatric workup.  Patient encouraged to follow-up with his outpatient psychiatrist for further care.  I feel that patient is appropriate for discharge at this time.          Final Clinical Impression(s) / ED Diagnoses Final diagnoses:  Medication management    Rx / DC Orders ED Discharge Orders     None         [name], PA-C 02/25/23 0818    Regan Lemming, MD 02/25/23 1521

## 2023-02-25 NOTE — Discharge Instructions (Addendum)
Thank you for allowing me to be part of your care today.  It is important for you to follow-up outpatient for medication management.   I have also provided information for the [facility].  They have a walk-in urgent care that you may go to for psychiatric care.

## 2023-02-28 ENCOUNTER — Other Ambulatory Visit: Payer: Self-pay

## 2023-02-28 ENCOUNTER — Emergency Department (HOSPITAL_COMMUNITY)
Admission: EM | Admit: 2023-02-28 | Discharge: 2023-02-28 | Disposition: A | Discharge status: Home / Self Care | Payer: Self-pay | Attending: Emergency Medicine | Admitting: Emergency Medicine

## 2023-02-28 ENCOUNTER — Encounter (HOSPITAL_COMMUNITY): Payer: Self-pay | Admitting: Emergency Medicine

## 2023-02-28 DIAGNOSIS — Z760 Encounter for issue of repeat prescription: Secondary | ICD-10-CM | POA: Insufficient documentation

## 2023-02-28 DIAGNOSIS — Z79899 Other long term (current) drug therapy: Secondary | ICD-10-CM

## 2023-02-28 MED ORDER — RISPERIDONE 0.5 MG PO TABS
1.0000 mg | ORAL_TABLET | Freq: Once | ORAL | Status: AC
  Administered 2023-02-28: 1 mg via ORAL
  Filled 2023-02-28: qty 2

## 2023-02-28 NOTE — Discharge Instructions (Addendum)
Today your risperidone was refilled.  I have attached a primary care provider for you to follow-up with for long-term management.  Risperidone.  If symptoms worsen please return to the ER.

## 2023-02-28 NOTE — ED Provider Notes (Signed)
[place] AT [facility] Provider Note   CSN: [id number] Arrival date & time: 02/28/23  [id number]     History  Chief Complaint  Patient presents with   Hallucinations    Cole Barrett is a 37 y.o. male medical history of substance-induced mood disorder, schizoaffective disorder, homelessness, malingering presented to the ED requesting risperidone.  Patient states he is on risperidone for his mind and that this morning he forgot to take it going to work like his risperidone before going to work.  Patient states he does not see a psychiatrist but has been seen chronically in the ED for his risperidone.  Patient states he has chronic hallucinations but denied any hallucinations since he has been discharged or thoughts of hurting himself or others.  Patient was last seen 02/25/2023 for the same chief concern and denied any changes in symptoms.  Patient denied chest pain, shortness of breath, fever/chills, nausea/vomiting, abdominal pain, vision changes, headache, falls, drug use, changes in sensation/motor skills  Home Medications Prior to Admission medications   Medication Sig Start Date End Date Taking? Authorizing Provider  acetaminophen (TYLENOL 8 HOUR) 650 MG CR tablet Take 1 tablet (650 mg total) by mouth every 8 (eight) hours as needed for pain. 02/14/23   Suzy Bouchard, PA-C  ibuprofen (ADVIL) 600 MG tablet Take 1 tablet (600 mg total) by mouth every 6 (six) hours as needed. Patient taking differently: Take 600 mg by mouth every 6 (six) hours as needed for mild pain. 12/24/22   Harris, Vernie Shanks, PA-C  naproxen (NAPROSYN) 375 MG tablet Take 1 tablet (375 mg total) by mouth 2 (two) times daily. 02/14/23   Kommor, Madison, MD  risperiDONE (RISPERDAL M-TABS) 1 MG disintegrating tablet Take 1 tablet (1 mg total) by mouth 2 (two) times daily for 10 days. 12/24/22 01/07/23  Margarita Mail, PA-C  amantadine (SYMMETREL) 100 MG capsule Take 1 capsule (100 mg  total) by mouth 2 (two) times daily. Patient not taking: Reported on 11/04/2019 09/01/19 11/17/19  Mallie Darting, NP      Allergies    Patient has no known allergies.    Review of Systems   Review of Systems See HPI Physical Exam Updated Vital Signs BP 132/77 (BP Location: Right Arm)   Pulse 85   Temp 98.3 F (36.8 C) (Oral)   Resp 17   Wt 99.8 kg   SpO2 99%   BMI 31.57 kg/m  Physical Exam  ED Results / Procedures / Treatments   Labs (all labs ordered are listed, but only abnormal results are displayed) Labs Reviewed - No data to display  EKG None  Radiology No results found.  Procedures Procedures    Medications Ordered in ED Medications  risperiDONE (RISPERDAL) tablet 1 mg (has no administration in time range)    ED Course/ Medical Decision Making/ A&P                             Medical Decision Making Risk Prescription drug management.   Cole Barrett 37 y.o. presented today for medication refill. Working DDx that I considered at this time includes, but not limited to, medication refill, psychosis, SI/HI, hallucinations, drug-induced mood disorder.  R/o DDx: psychosis, SI/HI, hallucinations, drug-induced mood disorder: These are considered less likely due to history of present illness and physical exam findings  Review of prior external notes: 02/25/2023 ED provider  Unique Tests and My Interpretation: None  Discussion with Independent Historian: None  Discussion of Management of Tests: None  Risk: Medium: prescription drug management  Risk Stratification Score: None  Plan: Patient presented for risperidone refill. On exam patient was sleeping comfortably under a blanket and in no acute distress with stable vitals.  Patient verbalized that he just needed his risperidone refilled before going to work as he forgot to take it this morning at home before leaving.  Patient denied any systemic symptoms or hallucination/delusion/HI/SI.   Patient denied any new changes since being discharged 2 days ago.  At this time patient's risperidone to be refilled and patient be discharged as I feel patient is safe to be discharged.  At this time patient does not require psychiatric workup.  I encouraged patient follow-up with his outpatient psychiatrist for further care.  Patient was given return precautions. Patient stable for discharge at this time.  Patient verbalized understanding of plan.        Final Clinical Impression(s) / ED Diagnoses Final diagnoses:  Medication management    Rx / DC Orders ED Discharge Orders     None         Elvina Sidle 02/28/23 0746    Pattricia Boss, MD 03/02/23 1340

## 2023-02-28 NOTE — ED Notes (Signed)
Pt eating sandwich and drinking soda

## 2023-02-28 NOTE — ED Triage Notes (Signed)
Pt in with c/o hearing voices and asking for Risperdal medication. No other complaints at this time. Denies any SI/HI

## 2023-03-01 ENCOUNTER — Emergency Department (HOSPITAL_COMMUNITY)
Admission: EM | Admit: 2023-03-01 | Discharge: 2023-03-02 | Disposition: A | Discharge status: Home / Self Care | Payer: Self-pay | Attending: Emergency Medicine | Admitting: Emergency Medicine

## 2023-03-01 ENCOUNTER — Other Ambulatory Visit: Payer: Self-pay

## 2023-03-01 ENCOUNTER — Encounter (HOSPITAL_COMMUNITY): Payer: Self-pay | Admitting: Emergency Medicine

## 2023-03-01 DIAGNOSIS — Z5900 Homelessness unspecified: Secondary | ICD-10-CM | POA: Insufficient documentation

## 2023-03-01 DIAGNOSIS — R440 Auditory hallucinations: Secondary | ICD-10-CM | POA: Insufficient documentation

## 2023-03-01 MED ORDER — RISPERIDONE 0.5 MG PO TABS
1.0000 mg | ORAL_TABLET | Freq: Once | ORAL | Status: AC
  Administered 2023-03-01: 1 mg via ORAL
  Filled 2023-03-01: qty 2

## 2023-03-01 NOTE — ED Triage Notes (Signed)
Pt states he is hearing voices. Reports he has been taking his meds but missed them today because they were at "another house".

## 2023-03-02 ENCOUNTER — Emergency Department (HOSPITAL_COMMUNITY)
Admission: EM | Admit: 2023-03-02 | Discharge: 2023-03-02 | Disposition: A | Discharge status: Home / Self Care | Payer: Self-pay | Attending: Emergency Medicine | Admitting: Emergency Medicine

## 2023-03-02 ENCOUNTER — Other Ambulatory Visit: Payer: Self-pay

## 2023-03-02 DIAGNOSIS — M79601 Pain in right arm: Secondary | ICD-10-CM | POA: Insufficient documentation

## 2023-03-02 DIAGNOSIS — W2209XA Striking against other stationary object, initial encounter: Secondary | ICD-10-CM | POA: Insufficient documentation

## 2023-03-02 MED ORDER — IBUPROFEN 200 MG PO TABS
600.0000 mg | ORAL_TABLET | Freq: Once | ORAL | Status: AC
  Administered 2023-03-02: 600 mg via ORAL
  Filled 2023-03-02: qty 3

## 2023-03-02 NOTE — ED Triage Notes (Signed)
Pt says that he hit his right arm on a wall. (Upper arm), wanting some pain pills for the same. No obvious injury noted

## 2023-03-02 NOTE — ED Provider Notes (Signed)
[facility] Provider Note   CSN: [id number] Arrival date & time: 03/02/23  2143     History  Chief Complaint  Patient presents with   Arm Injury    Cole Barrett is a 37 y.o. male.  Patient presents to the emergency department today for evaluation of right arm pain.  Patient states that he was lifting up a box and his arm somehow impacted a truck.  He has had soreness in the right triceps area since that time.  No difficulty with range of motion.  He is here requesting something to "help take the pain down".  Denies any distal numbness or tingling.  Denies other injuries.       Home Medications Prior to Admission medications   Medication Sig Start Date End Date Taking? Authorizing Provider  acetaminophen (TYLENOL 8 HOUR) 650 MG CR tablet Take 1 tablet (650 mg total) by mouth every 8 (eight) hours as needed for pain. 02/14/23   Suzy Bouchard, PA-C  ibuprofen (ADVIL) 600 MG tablet Take 1 tablet (600 mg total) by mouth every 6 (six) hours as needed. Patient taking differently: Take 600 mg by mouth every 6 (six) hours as needed for mild pain. 12/24/22   Harris, Vernie Shanks, PA-C  naproxen (NAPROSYN) 375 MG tablet Take 1 tablet (375 mg total) by mouth 2 (two) times daily. 02/14/23   Kommor, Madison, MD  risperiDONE (RISPERDAL M-TABS) 1 MG disintegrating tablet Take 1 tablet (1 mg total) by mouth 2 (two) times daily for 10 days. 12/24/22 03/02/23  Margarita Mail, PA-C  amantadine (SYMMETREL) 100 MG capsule Take 1 capsule (100 mg total) by mouth 2 (two) times daily. Patient not taking: Reported on 11/04/2019 09/01/19 11/17/19  Mallie Darting, NP      Allergies    Patient has no known allergies.    Review of Systems   Review of Systems  Physical Exam Updated Vital Signs BP 126/72 (BP Location: Left Arm)   Pulse 70   Temp 97.6 F (36.4 C) (Oral)   Resp 16   SpO2 94%  Physical Exam Vitals and nursing note reviewed.   Constitutional:      Appearance: He is well-developed.  HENT:     Head: Normocephalic and atraumatic.  Eyes:     Conjunctiva/sclera: Conjunctivae normal.  Pulmonary:     Effort: No respiratory distress.  Musculoskeletal:     Cervical back: Normal range of motion and neck supple.     Comments: Patient with tenderness over the right tricep.  No signs of trauma noted such as laceration or ecchymosis.  Patient has full range of motion of the right wrist, elbow, shoulder.  2+ radial pulse.  Skin:    General: Skin is warm and dry.  Neurological:     Mental Status: He is alert.     ED Results / Procedures / Treatments   Labs (all labs ordered are listed, but only abnormal results are displayed) Labs Reviewed - No data to display  EKG None  Radiology No results found.  Procedures Procedures    Medications Ordered in ED Medications  ibuprofen (ADVIL) tablet 600 mg (has no administration in time range)    ED Course/ Medical Decision Making/ A&P    Patient seen and examined. History obtained directly from patient. Work-up including labs, imaging, EKG ordered in triage, if performed, were reviewed.    Labs/EKG: None ordered  Imaging: None ordered  Medications/Fluids: Ibuprofen p.o.  Most recent vital  signs reviewed and are as follows: BP 126/72 (BP Location: Left Arm)   Pulse 70   Temp 97.6 F (36.4 C) (Oral)   Resp 16   SpO2 94%   Initial impression: Arm pain  Home treatment plan: Rest, OTC meds  Return instructions discussed with patient: As needed                            Medical Decision Making Risk OTC drugs.   Patient with reported right arm pain.  Any injury is minor.  No joint disruption.  No decreased range of motion..  No laceration or hematoma.  Patient frequently in the emergency room, now with 101 visits in the past 6 months.        Final Clinical Impression(s) / ED Diagnoses Final diagnoses:  Pain of right upper extremity    Rx  / DC Orders ED Discharge Orders     None         Renne Crigler, PA-C 03/02/23 2259    Sloan Leiter, DO 03/03/23 [phone]

## 2023-03-02 NOTE — ED Provider Notes (Signed)
[place] AT [facility] Provider Note   CSN: [id number] Arrival date & time: 03/01/23  2227     History  Chief Complaint  Patient presents with   Psychiatric Evaluation    Cole Barrett is a 37 y.o. male.  HPI  Patient here requesting Risperdal  Chronic hallucinations (auditory)  No SI or HI     Home Medications Prior to Admission medications   Medication Sig Start Date End Date Taking? Authorizing Provider  acetaminophen (TYLENOL 8 HOUR) 650 MG CR tablet Take 1 tablet (650 mg total) by mouth every 8 (eight) hours as needed for pain. 02/14/23   Suzy Bouchard, PA-C  ibuprofen (ADVIL) 600 MG tablet Take 1 tablet (600 mg total) by mouth every 6 (six) hours as needed. Patient taking differently: Take 600 mg by mouth every 6 (six) hours as needed for mild pain. 12/24/22   Harris, Vernie Shanks, PA-C  naproxen (NAPROSYN) 375 MG tablet Take 1 tablet (375 mg total) by mouth 2 (two) times daily. 02/14/23   Kommor, Madison, MD  risperiDONE (RISPERDAL M-TABS) 1 MG disintegrating tablet Take 1 tablet (1 mg total) by mouth 2 (two) times daily for 10 days. 12/24/22 01/07/23  Margarita Mail, PA-C  amantadine (SYMMETREL) 100 MG capsule Take 1 capsule (100 mg total) by mouth 2 (two) times daily. Patient not taking: Reported on 11/04/2019 09/01/19 11/17/19  Mallie Darting, NP      Allergies    Patient has no known allergies.    Review of Systems   Review of Systems  Physical Exam Updated Vital Signs BP 123/78   Pulse 78   Temp 98 F (36.7 C) (Oral)   Resp 16   SpO2 100%  Physical Exam Vitals and nursing note reviewed.  Constitutional:      General: He is not in acute distress.    Appearance: Normal appearance. He is not ill-appearing.  HENT:     Head: Normocephalic and atraumatic.  Eyes:     General: No scleral icterus.       Right eye: No discharge.        Left eye: No discharge.     Conjunctiva/sclera: Conjunctivae normal.   Pulmonary:     Effort: Pulmonary effort is normal.     Breath sounds: No stridor.  Neurological:     Mental Status: He is alert and oriented to person, place, and time. Mental status is at baseline.     ED Results / Procedures / Treatments   Labs (all labs ordered are listed, but only abnormal results are displayed) Labs Reviewed - No data to display  EKG None  Radiology No results found.  Procedures Procedures    Medications Ordered in ED Medications  risperiDONE (RISPERDAL) tablet 1 mg (1 mg Oral Given 03/01/23 2344)    ED Course/ Medical Decision Making/ A&P                             Medical Decision Making Risk Prescription drug management.   Patient here requesting Risperdal  Chronic hallucinations (auditory)  No SI or HI   Patient given his Risperdal.  Will discharge home at this time.    Final Clinical Impression(s) / ED Diagnoses Final diagnoses:  Continuous auditory hallucinations  Homelessness    Rx / DC Orders ED Discharge Orders     None         Tedd Sias, [place] 03/02/23 0145  Ezequiel Essex, MD 03/02/23 [phone]

## 2023-03-02 NOTE — ED Notes (Signed)
Pt given a [place] sandwich and coke to drink

## 2023-03-04 ENCOUNTER — Encounter (HOSPITAL_COMMUNITY): Payer: Self-pay

## 2023-03-04 ENCOUNTER — Emergency Department (HOSPITAL_COMMUNITY)
Admission: EM | Admit: 2023-03-04 | Discharge: 2023-03-04 | Disposition: A | Discharge status: Home / Self Care | Payer: Self-pay | Attending: Emergency Medicine | Admitting: Emergency Medicine

## 2023-03-04 ENCOUNTER — Other Ambulatory Visit: Payer: Self-pay

## 2023-03-04 DIAGNOSIS — R443 Hallucinations, unspecified: Secondary | ICD-10-CM | POA: Insufficient documentation

## 2023-03-04 DIAGNOSIS — Z760 Encounter for issue of repeat prescription: Secondary | ICD-10-CM | POA: Insufficient documentation

## 2023-03-04 DIAGNOSIS — F209 Schizophrenia, unspecified: Secondary | ICD-10-CM

## 2023-03-04 MED ORDER — RISPERIDONE 0.5 MG PO TABS
2.0000 mg | ORAL_TABLET | Freq: Once | ORAL | Status: AC
  Administered 2023-03-04: 2 mg via ORAL
  Filled 2023-03-04: qty 4

## 2023-03-04 NOTE — ED Triage Notes (Signed)
Pt coming in today needing a medication refill. Pt would also like to talk with a counselor about the hearing voices he has been hearing. Denies HI/SI.

## 2023-03-04 NOTE — Discharge Instructions (Signed)
You can follow-up at the behavioral health urgent care as needed.

## 2023-03-04 NOTE — ED Provider Notes (Signed)
Cole Barrett is a 37 y.o. male.   Medication Refill Patient presents for his 102nd visit in the last 6 months.  States he was at work today and had his continuous hallucinations which she always has.  States it felt like it was a lot for him so he thought he needed to come in here.  States he just needs to sit here for a minute and rest his head.  Also requesting a blanket.  States he took his Risperdal last night but has not had any this morning.  States he takes both.     Home Medications Prior to Admission medications   Medication Sig Start Date End Date Taking? Authorizing Provider  acetaminophen (TYLENOL 8 HOUR) 650 MG CR tablet Take 1 tablet (650 mg total) by mouth every 8 (eight) hours as needed for pain. 02/14/23   Mannie Stabile, PA-C  ibuprofen (ADVIL) 600 MG tablet Take 1 tablet (600 mg total) by mouth every 6 (six) hours as needed. Patient taking differently: Take 600 mg by mouth every 6 (six) hours as needed for mild pain. 12/24/22   Harris, Cammy Copa, PA-C  naproxen (NAPROSYN) 375 MG tablet Take 1 tablet (375 mg total) by mouth 2 (two) times daily. 02/14/23   Kommor, Madison, MD  risperiDONE (RISPERDAL M-TABS) 1 MG disintegrating tablet Take 1 tablet (1 mg total) by mouth 2 (two) times daily for 10 days. 12/24/22 03/02/23  Arthor Captain, PA-C  amantadine (SYMMETREL) 100 MG capsule Take 1 capsule (100 mg total) by mouth 2 (two) times daily. Patient not taking: Reported on 11/04/2019 09/01/19 11/17/19  [name], NP      Allergies    Patient has no known allergies.    Review of Systems   Review of Systems  Physical Exam Updated Vital Signs BP (!) 149/76 (BP Location: Left Arm)   Pulse 81   Temp 98.3 F (36.8 C) (Oral)    Resp 16   Ht 5\' 10"  (1.778 m)   Wt 99.8 kg   SpO2 97%   BMI 31.57 kg/m  Physical Exam Vitals and nursing note reviewed.  HENT:     Head: Normocephalic.  Cardiovascular:     Rate and Rhythm: Normal rate.  Pulmonary:     Effort: No respiratory distress.  Neurological:     Mental Status: He is alert. Mental status is at baseline.     ED Results / Procedures / Treatments   Labs (all labs ordered are listed, but only abnormal results are displayed) Labs Reviewed - No data to display  EKG None  Radiology No results found.  Procedures Procedures    Medications Ordered in ED Medications  risperiDONE (RISPERDAL) tablet 2 mg (has no administration in time range)    ED Course/ Medical Decision Making/ A&P                             Medical Decision Making Risk Prescription drug management.   Patient presents to "restless head.  States he is hallucinating.  Has been hallucinating chronically.  Reviewed previous notes.  This is 102nd visit in the last 6 months.  Will give a dose of Risperdal  orally here.  Do not think we need acute psychiatric evaluation though.  This appears to be his baseline.  Given information for follow-up with behavioral health urgent care as needed.  Will discharge.       Final Clinical Impression(s) / ED Diagnoses Final diagnoses:  Schizophrenia, unspecified type    Rx / DC Orders ED Discharge Orders     None         Benjiman CorePickering, Saphia Vanderford, MD 03/04/23 1341

## 2023-03-06 ENCOUNTER — Other Ambulatory Visit: Payer: Self-pay

## 2023-03-06 ENCOUNTER — Emergency Department (HOSPITAL_COMMUNITY)
Admission: EM | Admit: 2023-03-06 | Discharge: 2023-03-06 | Disposition: A | Discharge status: Home / Self Care | Payer: Medicaid Other | Attending: Emergency Medicine | Admitting: Emergency Medicine

## 2023-03-06 ENCOUNTER — Encounter (HOSPITAL_COMMUNITY): Payer: Self-pay

## 2023-03-06 DIAGNOSIS — Z760 Encounter for issue of repeat prescription: Secondary | ICD-10-CM | POA: Insufficient documentation

## 2023-03-06 DIAGNOSIS — Z7689 Persons encountering health services in other specified circumstances: Secondary | ICD-10-CM

## 2023-03-06 DIAGNOSIS — Z0289 Encounter for other administrative examinations: Secondary | ICD-10-CM | POA: Insufficient documentation

## 2023-03-06 MED ORDER — IBUPROFEN 200 MG PO TABS
600.0000 mg | ORAL_TABLET | Freq: Once | ORAL | Status: AC
  Administered 2023-03-06: 600 mg via ORAL
  Filled 2023-03-06: qty 3

## 2023-03-06 MED ORDER — RISPERIDONE 0.5 MG PO TABS
1.0000 mg | ORAL_TABLET | Freq: Once | ORAL | Status: AC
  Administered 2023-03-06: 1 mg via ORAL
  Filled 2023-03-06: qty 2

## 2023-03-06 NOTE — ED Notes (Signed)
Patient did not want to have vital signs rechecked prior to discharge.  Requesting snack and sandwich

## 2023-03-06 NOTE — ED Triage Notes (Signed)
Patient reports he needs his Risperdal and pain pill for his right shoulder pain. States he hurt his shoulder a few days ago.

## 2023-03-06 NOTE — ED Provider Notes (Signed)
[place] EMERGENCY DEPARTMENT AT [facility] Provider Note   CSN: [id number] Arrival date & time: 03/06/23  0144     History  Chief Complaint  Patient presents with   Medication Refill    Cole Barrett is a 37 y.o. male.  37 year old male, well-known to the emergency department, presenting requesting a dose of his Risperdal.  He is also requesting a pill for persistent right shoulder pain which happened a few days ago while at work.  He continues to have preserved range of motion of the extremity.  Is asking for something to help with the "swelling".  The history is provided by the patient. No language interpreter was used.  Medication Refill      Home Medications Prior to Admission medications   Medication Sig Start Date End Date Taking? Authorizing Provider  acetaminophen (TYLENOL 8 HOUR) 650 MG CR tablet Take 1 tablet (650 mg total) by mouth every 8 (eight) hours as needed for pain. 02/14/23   Mannie Stabile, PA-C  ibuprofen (ADVIL) 600 MG tablet Take 1 tablet (600 mg total) by mouth every 6 (six) hours as needed. Patient taking differently: Take 600 mg by mouth every 6 (six) hours as needed for mild pain. 12/24/22   Harris, Cammy Copa, PA-C  naproxen (NAPROSYN) 375 MG tablet Take 1 tablet (375 mg total) by mouth 2 (two) times daily. 02/14/23   Kommor, Madison, MD  risperiDONE (RISPERDAL M-TABS) 1 MG disintegrating tablet Take 1 tablet (1 mg total) by mouth 2 (two) times daily for 10 days. 12/24/22 03/02/23  Arthor Captain, PA-C  amantadine (SYMMETREL) 100 MG capsule Take 1 capsule (100 mg total) by mouth 2 (two) times daily. Patient not taking: Reported on 11/04/2019 09/01/19 11/17/19  [name], NP      Allergies    Patient has no known allergies.    Review of Systems   Review of Systems Ten systems reviewed and are negative for acute change, except as noted in the HPI.    Physical Exam Updated Vital Signs BP 134/82 (BP Location: Right Arm)    Pulse 64   Temp 98.1 F (36.7 C) (Oral)   Resp 17   Ht 5\' 10"  (1.778 m)   Wt 99.8 kg   SpO2 100%   BMI 31.57 kg/m   Physical Exam Vitals and nursing note reviewed.  Constitutional:      General: He is not in acute distress.    Appearance: He is well-developed. He is not diaphoretic.  HENT:     Head: Normocephalic and atraumatic.  Eyes:     General: No scleral icterus.    Conjunctiva/sclera: Conjunctivae normal.  Pulmonary:     Effort: Pulmonary effort is normal. No respiratory distress.  Musculoskeletal:        General: Normal range of motion.     Cervical back: Normal range of motion.  Skin:    General: Skin is warm and dry.     Coloration: Skin is not pale.     Findings: No erythema or rash.  Neurological:     Mental Status: He is alert and oriented to person, place, and time.  Psychiatric:        Behavior: Behavior normal.     ED Results / Procedures / Treatments   Labs (all labs ordered are listed, but only abnormal results are displayed) Labs Reviewed - No data to display  EKG None  Radiology No results found.  Procedures Procedures    Medications Ordered in  ED Medications  ibuprofen (ADVIL) tablet 600 mg (has no administration in time range)  risperiDONE (RISPERDAL) tablet 1 mg (has no administration in time range)    ED Course/ Medical Decision Making/ A&P                             Medical Decision Making Risk OTC drugs. Prescription drug management.   This patient presents to the ED for concern of medication administration, this involves an extensive number of treatment options, and is a complaint that carries with it a high risk of complications and morbidity.     Co morbidities that complicate the patient evaluation  Schizophrenia   Additional history obtained:  External records from outside source obtained and reviewed including prior imaging evaluation; last imaging was of CT head in December   Cardiac Monitoring:  The  patient was maintained on a cardiac monitor.  I personally viewed and interpreted the cardiac monitored which showed an underlying rhythm of: NSR   Medicines ordered and prescription drug management:  I ordered medication including Risperdal for medication compliance and ibuprofen for RUE pain  Reevaluation of the patient after these medicines showed that the patient stayed the same I have reviewed the patients home medicines and have made adjustments as needed   Test Considered:  Xray R shoulder   Reevaluation:  After the interventions noted above, I reevaluated the patient and found that they have :stayed the same   Social Determinants of Health:  Homeless    Dispostion:  After consideration of the diagnostic results and the patients response to treatment, I feel that the patent would benefit from outpatient PCP follow up. Return precautions discussed and provided. Patient discharged in stable condition with no unaddressed concerns.          Final Clinical Impression(s) / ED Diagnoses Final diagnoses:  Encounter for medication administration    Rx / DC Orders ED Discharge Orders     None         [name], PA-C 03/06/23 0254    Palumbo, April, MD 03/06/23 0712

## 2023-03-09 ENCOUNTER — Encounter (HOSPITAL_COMMUNITY): Payer: Self-pay

## 2023-03-09 ENCOUNTER — Emergency Department (HOSPITAL_COMMUNITY)
Admission: EM | Admit: 2023-03-09 | Discharge: 2023-03-09 | Disposition: A | Discharge status: Home / Self Care | Payer: Self-pay | Attending: Emergency Medicine | Admitting: Emergency Medicine

## 2023-03-09 DIAGNOSIS — Z760 Encounter for issue of repeat prescription: Secondary | ICD-10-CM | POA: Insufficient documentation

## 2023-03-09 DIAGNOSIS — Z79899 Other long term (current) drug therapy: Secondary | ICD-10-CM

## 2023-03-09 MED ORDER — RISPERIDONE 0.5 MG PO TABS
1.0000 mg | ORAL_TABLET | Freq: Once | ORAL | Status: AC
  Administered 2023-03-09: 1 mg via ORAL
  Filled 2023-03-09: qty 2

## 2023-03-09 NOTE — ED Triage Notes (Signed)
Pt presents to ED for evaluation of hearing voices. Pt requests dose of Risperdal and wants to talk to [facility].

## 2023-03-09 NOTE — ED Provider Notes (Signed)
[place] EMERGENCY DEPARTMENT AT [facility] Provider Note   CSN: [id number] Arrival date & time: 03/09/23  1934     History  Chief Complaint  Patient presents with   Psychiatric Evaluation    Cole Barrett is a 37 y.o. male.  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  Denies any SI or HI.  Home Medications Prior to Admission medications   Medication Sig Start Date End Date Taking? Authorizing Provider  acetaminophen (TYLENOL 8 HOUR) 650 MG CR tablet Take 1 tablet (650 mg total) by mouth every 8 (eight) hours as needed for pain. 02/14/23   Mannie Stabile, PA-C  ibuprofen (ADVIL) 600 MG tablet Take 1 tablet (600 mg total) by mouth every 6 (six) hours as needed. Patient taking differently: Take 600 mg by mouth every 6 (six) hours as needed for mild pain. 12/24/22   Harris, Cammy Copa, PA-C  naproxen (NAPROSYN) 375 MG tablet Take 1 tablet (375 mg total) by mouth 2 (two) times daily. 02/14/23   Kommor, Madison, MD  risperiDONE (RISPERDAL M-TABS) 1 MG disintegrating tablet Take 1 tablet (1 mg total) by mouth 2 (two) times daily for 10 days. 12/24/22 03/02/23  Arthor Captain, PA-C  amantadine (SYMMETREL) 100 MG capsule Take 1 capsule (100 mg total) by mouth 2 (two) times daily. Patient not taking: Reported on 11/04/2019 09/01/19 11/17/19  [name], NP      Allergies    Patient has no known allergies.    Review of Systems   Review of Systems  Physical Exam Updated Vital Signs BP (!) 143/75 (BP Location: Left Arm)   Pulse 79   Temp 98.2 F (36.8 C) (Oral)   Resp 17   Ht 5\' 10"  (1.778 m)   Wt 99.8 kg   SpO2 100%   BMI 31.57 kg/m  Physical Exam Vitals and nursing note reviewed. Exam conducted with a chaperone present.  Constitutional:      Appearance: Normal appearance.  HENT:     Head: Normocephalic and atraumatic.  Eyes:     General: No scleral icterus.        Right eye: No discharge.        Left eye: No discharge.     Extraocular Movements: Extraocular movements intact.     Pupils: Pupils are equal, round, and reactive to light.  Cardiovascular:     Rate and Rhythm: Normal rate and regular rhythm.     Pulses: Normal pulses.     Heart sounds: Normal heart sounds. No murmur heard.    No friction rub. No gallop.  Pulmonary:     Effort: Pulmonary effort is normal. No respiratory distress.     Breath sounds: Normal breath sounds.  Abdominal:     General: Abdomen is flat. Bowel sounds are normal. There is no distension.     Palpations: Abdomen is soft.     Tenderness: There is no abdominal tenderness.  Skin:    General: Skin is warm and dry.     Coloration: Skin is not jaundiced.  Neurological:     Mental Status: He is alert. Mental status is at baseline.     Coordination: Coordination normal.  Psychiatric:     Comments: Does not responding to internal stimuli.     ED Results / Procedures / Treatments   Labs (all labs ordered are listed, but only abnormal results are displayed) Labs Reviewed - No  data to display  EKG None  Radiology No results found.  Procedures Procedures    Medications Ordered in ED Medications  risperiDONE (RISPERDAL) tablet 1 mg (1 mg Oral Given 03/09/23 2135)    ED Course/ Medical Decision Making/ A&P                             Medical Decision Making Risk Prescription drug management.   Patient presenting requesting risperidone.  I reviewed external medical Rx, patient is seen here for this frequently.  He is not having any active SI or HI, does not actually responding to internal stimuli.  I do not think he needs behavioral health intervention for chronic hallucinations, will have him follow-up with PCP and behavioral health.  Stable for discharge.        Final Clinical Impression(s) / ED Diagnoses Final diagnoses:  Encounter for medication management    Rx / DC Orders ED  Discharge Orders     None         Theron Arista, Cordelia Poche 03/09/23 2146    Bethann Berkshire, MD 03/10/23 1221

## 2023-03-09 NOTE — ED Notes (Signed)
Pt A&OX4 ambulatory at d/c with independent steady gait. Pt verbalized understanding of d/c instructions and follow up care. 

## 2023-03-09 NOTE — Discharge Instructions (Signed)
Use the emergency department for emergencies. 

## 2023-03-12 ENCOUNTER — Emergency Department (HOSPITAL_COMMUNITY)
Admission: EM | Admit: 2023-03-12 | Discharge: 2023-03-12 | Disposition: A | Discharge status: Home / Self Care | Payer: Self-pay | Attending: Emergency Medicine | Admitting: Emergency Medicine

## 2023-03-12 ENCOUNTER — Emergency Department (HOSPITAL_COMMUNITY): Payer: Self-pay

## 2023-03-12 ENCOUNTER — Encounter (HOSPITAL_COMMUNITY): Payer: Self-pay

## 2023-03-12 DIAGNOSIS — M25521 Pain in right elbow: Secondary | ICD-10-CM | POA: Insufficient documentation

## 2023-03-12 MED ORDER — ACETAMINOPHEN 325 MG PO TABS
650.0000 mg | ORAL_TABLET | Freq: Once | ORAL | Status: AC
  Administered 2023-03-12: 650 mg via ORAL
  Filled 2023-03-12: qty 2

## 2023-03-12 NOTE — ED Triage Notes (Signed)
Pt arrived this morning for right elbow pain. Pt reports thinks he slept on it wrong, would like something for pain. Pt denies any new injuries.  Pt reports does not need a dose of his risperiDONE this morning.

## 2023-03-12 NOTE — Discharge Instructions (Signed)
Your elbow pain is likely due to soreness from your injury.  You have chosen to not have an x-ray.  I recommend that you rest, ice, compress, and elevate your elbow and take Tylenol/ibuprofen as needed for pain.  Return if development of any new or worsening symptoms.

## 2023-03-12 NOTE — ED Provider Notes (Signed)
[place] EMERGENCY DEPARTMENT AT [facility] Provider Note   CSN: [id number] Arrival date & time: 03/12/23  4098     History  Chief Complaint  Patient presents with   Rt Elbow Pain    Cole Barrett is a 37 y.o. male.  Patient well known to the emergency department presents today with complaints of elbow pain. He states that yesterday he bumped his elbow on a wall and has felt soreness since then. He denies fevers or chills. No other injuries or complaints. He has not taken anything for his symptoms.  The history is provided by the patient. No language interpreter was used.       Home Medications Prior to Admission medications   Medication Sig Start Date End Date Taking? Authorizing Provider  acetaminophen (TYLENOL 8 HOUR) 650 MG CR tablet Take 1 tablet (650 mg total) by mouth every 8 (eight) hours as needed for pain. 02/14/23   Mannie Stabile, PA-C  ibuprofen (ADVIL) 600 MG tablet Take 1 tablet (600 mg total) by mouth every 6 (six) hours as needed. Patient taking differently: Take 600 mg by mouth every 6 (six) hours as needed for mild pain. 12/24/22   Harris, Cammy Copa, PA-C  naproxen (NAPROSYN) 375 MG tablet Take 1 tablet (375 mg total) by mouth 2 (two) times daily. 02/14/23   Kommor, Madison, MD  risperiDONE (RISPERDAL M-TABS) 1 MG disintegrating tablet Take 1 tablet (1 mg total) by mouth 2 (two) times daily for 10 days. 12/24/22 03/02/23  Arthor Captain, PA-C  amantadine (SYMMETREL) 100 MG capsule Take 1 capsule (100 mg total) by mouth 2 (two) times daily. Patient not taking: Reported on 11/04/2019 09/01/19 11/17/19  [name], NP      Allergies    Patient has no known allergies.    Review of Systems   Review of Systems  All other systems reviewed and are negative.   Physical Exam Updated Vital Signs BP 111/67 (BP Location: Left Arm)   Pulse 88   Temp (!) 97.5 F (36.4 C) (Oral)   Resp 16   Ht  (1.778 m)   Wt 99.8 kg   SpO2 100%    BMI 31.57 kg/m  Physical Exam Vitals and nursing note reviewed.  Constitutional:      General: He is not in acute distress.    Appearance: Normal appearance. He is normal weight. He is not ill-appearing, toxic-appearing or diaphoretic.  HENT:     Head: Normocephalic and atraumatic.  Cardiovascular:     Rate and Rhythm: Normal rate.  Pulmonary:     Effort: Pulmonary effort is normal. No respiratory distress.  Musculoskeletal:        General: Normal range of motion.     Cervical back: Normal range of motion.     Comments: Mild TTP throughout the right elbow. No erythema, warmth, crepitus, deformity, or overlying skin changes. Radial and ulnar pulses intact and 2+. ROM intact.  Skin:    General: Skin is warm and dry.  Neurological:     General: No focal deficit present.     Mental Status: He is alert.  Psychiatric:        Mood and Affect: Mood normal.        Behavior: Behavior normal.     ED Results / Procedures / Treatments   Labs (all labs ordered are listed, but only abnormal results are displayed) Labs Reviewed - No data to display  EKG None  Radiology No results found.  Procedures Procedures    Medications Ordered in ED Medications  acetaminophen (TYLENOL) tablet 650 mg (650 mg Oral Given 03/12/23 7408)    ED Course/ Medical Decision Making/ A&P                             Medical Decision Making Amount and/or Complexity of Data Reviewed Radiology: ordered.   Patient presents today with complaints of right elbow injury. He is afebrile, non-toxic appearing, and in no acute distress with reassuring vital signs. Physical exam reveals mild TTP throughout the right elbow. No erythema, warmth, crepitus, deformity, or overlying skin changes. Radial and ulnar pulses intact and 2+. ROM intact. Patient given tylenol for pain with improvement. Offered x-ray imaging, however patient states that his pain is improved with tylenol and he does not want imaging which is  reasonable given low mechanism of injury. Evaluation and diagnostic testing in the emergency department does not suggest an emergent condition requiring admission or immediate intervention beyond what has been performed at this time.  Plan for discharge with close PCP follow-up.  Patient is understanding and amenable with plan, educated on red flag symptoms that would prompt immediate return.  Patient discharged in stable condition.   Final Clinical Impression(s) / ED Diagnoses Final diagnoses:  Right elbow pain    Rx / DC Orders ED Discharge Orders     None     An After Visit Summary was printed and given to the patient.     Vear Clock 03/12/23 1448    Mardene Sayer, MD 03/12/23 [phone]

## 2023-03-19 ENCOUNTER — Other Ambulatory Visit: Payer: Self-pay

## 2023-03-19 ENCOUNTER — Encounter (HOSPITAL_COMMUNITY): Payer: Self-pay | Admitting: Emergency Medicine

## 2023-03-19 ENCOUNTER — Emergency Department (HOSPITAL_COMMUNITY)
Admission: EM | Admit: 2023-03-19 | Discharge: 2023-03-19 | Disposition: A | Discharge status: Home / Self Care | Payer: Self-pay | Attending: Emergency Medicine | Admitting: Emergency Medicine

## 2023-03-19 DIAGNOSIS — R443 Hallucinations, unspecified: Secondary | ICD-10-CM | POA: Insufficient documentation

## 2023-03-19 MED ORDER — RISPERIDONE 0.5 MG PO TABS
1.0000 mg | ORAL_TABLET | Freq: Once | ORAL | Status: AC
  Administered 2023-03-19: 1 mg via ORAL
  Filled 2023-03-19: qty 2

## 2023-03-19 NOTE — ED Triage Notes (Signed)
Presents for nasal congestion and auditory hallucinations. Took his medications last night as prescribed for psychiatric problems.  Voices are telling him they are going to kill him.  Denies SI/HI Denies pain.

## 2023-03-19 NOTE — ED Provider Notes (Addendum)
[place] EMERGENCY DEPARTMENT AT [facility] Provider Note   CSN: [id number] Arrival date & time: 03/19/23  2100     History  No chief complaint on file.   Cole Barrett is a 37 y.o. male.  37 yo M with a chief complaint of hallucinations.  He has this chronically but feels like its gotten worse over the past 24 hours.  His he typically takes his medication at night but was at work and so was unable to take it this evening and so came here for evaluation.  He is having to get a dose of Risperdal.  He would also like to talk to behavioral health provider.  He has been congested for the past couple days.  Denies sick contacts denies difficulty breathing.  He denies SI or HI.        Home Medications Prior to Admission medications   Medication Sig Start Date End Date Taking? Authorizing Provider  acetaminophen (TYLENOL 8 HOUR) 650 MG CR tablet Take 1 tablet (650 mg total) by mouth every 8 (eight) hours as needed for pain. 02/14/23   Mannie Stabile, PA-C  ibuprofen (ADVIL) 600 MG tablet Take 1 tablet (600 mg total) by mouth every 6 (six) hours as needed. Patient taking differently: Take 600 mg by mouth every 6 (six) hours as needed for mild pain. 12/24/22   Harris, Cammy Copa, PA-C  naproxen (NAPROSYN) 375 MG tablet Take 1 tablet (375 mg total) by mouth 2 (two) times daily. 02/14/23   Kommor, Madison, MD  risperiDONE (RISPERDAL M-TABS) 1 MG disintegrating tablet Take 1 tablet (1 mg total) by mouth 2 (two) times daily for 10 days. 12/24/22 03/02/23  Arthor Captain, PA-C  amantadine (SYMMETREL) 100 MG capsule Take 1 capsule (100 mg total) by mouth 2 (two) times daily. Patient not taking: Reported on 11/04/2019 09/01/19 11/17/19  [name], NP      Allergies    Patient has no known allergies.    Review of Systems   Review of Systems  Physical Exam Updated Vital Signs BP 128/67 (BP Location: Right Arm)   Pulse 89   Temp 98.2 F (36.8 C) (Oral)   Resp 18    Ht  (1.778 m)   Wt 99.8 kg   SpO2 98%   BMI 31.57 kg/m  Physical Exam Vitals and nursing note reviewed.  Constitutional:      Appearance: He is well-developed.  HENT:     Head: Normocephalic and atraumatic.  Eyes:     Pupils: Pupils are equal, round, and reactive to light.  Neck:     Vascular: No JVD.  Cardiovascular:     Rate and Rhythm: Normal rate and regular rhythm.     Heart sounds: No murmur heard.    No friction rub. No gallop.  Pulmonary:     Effort: No respiratory distress.     Breath sounds: No wheezing.  Abdominal:     General: There is no distension.     Tenderness: There is no abdominal tenderness. There is no guarding or rebound.  Musculoskeletal:        General: Normal range of motion.     Cervical back: Normal range of motion and neck supple.  Skin:    Coloration: Skin is not pale.     Findings: No rash.  Neurological:     Mental Status: He is alert and oriented to person, place, and time.  Psychiatric:        Behavior: Behavior  normal.     ED Results / Procedures / Treatments   Labs (all labs ordered are listed, but only abnormal results are displayed) Labs Reviewed - No data to display  EKG None  Radiology No results found.  Procedures Procedures    Medications Ordered in ED Medications  risperiDONE (RISPERDAL) tablet 1 mg (1 mg Oral Given 03/19/23 2157)    ED Course/ Medical Decision Making/ A&P                             Medical Decision Making Risk Prescription drug management.   37 yo M with a chief complaints of hallucinations.  He tells me has had no trouble getting his medication or taking it at home but has not been able to get a dose this evening and would like a dose here.  He is congested by history though no obvious signs of URI on exam.  He would like to speak with a behavioral health provider.  He is not suicidal or homicidal.  I did discuss his case with the behavioral health provider at [facility], she reviewed his  charts and thought it would not be beneficial for him to be transferred there at this time.  I will discharge him.  He was given information to follow-up at the behavioral health urgent care center if needed.  10:03 PM:  I have discussed the diagnosis/risks/treatment options with the patient.  Evaluation and diagnostic testing in the emergency department does not suggest an emergent condition requiring admission or immediate intervention beyond what has been performed at this time.  They will follow up with PCP. We also discussed returning to the ED immediately if new or worsening sx occur. We discussed the sx which are most concerning (e.g., sudden worsening pain, fever, inability to tolerate by mouth) that necessitate immediate return. Medications administered to the patient during their visit and any new prescriptions provided to the patient are listed below.  Medications given during this visit Medications  risperiDONE (RISPERDAL) tablet 1 mg (1 mg Oral Given 03/19/23 2157)     The patient appears reasonably screen and/or stabilized for discharge and I doubt any other medical condition or other [facility] requiring further screening, evaluation, or treatment in the ED at this time prior to discharge.          Final Clinical Impression(s) / ED Diagnoses Final diagnoses:  Hallucinations    Rx / DC Orders ED Discharge Orders     None          Melene Plan, DO 03/19/23 2203

## 2023-03-21 ENCOUNTER — Other Ambulatory Visit: Payer: Self-pay

## 2023-03-21 ENCOUNTER — Emergency Department (HOSPITAL_COMMUNITY)
Admission: EM | Admit: 2023-03-21 | Discharge: 2023-03-22 | Disposition: A | Discharge status: Home / Self Care | Payer: Self-pay | Attending: Student | Admitting: Student

## 2023-03-21 DIAGNOSIS — R443 Hallucinations, unspecified: Secondary | ICD-10-CM

## 2023-03-21 DIAGNOSIS — R440 Auditory hallucinations: Secondary | ICD-10-CM | POA: Insufficient documentation

## 2023-03-21 DIAGNOSIS — Z5900 Homelessness unspecified: Secondary | ICD-10-CM | POA: Insufficient documentation

## 2023-03-21 MED ORDER — RISPERIDONE 1 MG PO TABS
1.0000 mg | ORAL_TABLET | Freq: Once | ORAL | Status: AC
  Administered 2023-03-21: 1 mg via ORAL
  Filled 2023-03-21: qty 1

## 2023-03-21 NOTE — ED Provider Triage Note (Signed)
Emergency Medicine Provider Triage Evaluation Note  Issai Werling , a 37 y.o. male  was evaluated in triage.  Pt complains of auditory hallucinations, states that he started to hear voices today, states that he has been home and take his medication because its across town and just needs medication to calm down..  Review of Systems  Positive: Hallucinations, being tired Negative: Nausea vomiting  Physical Exam  BP (!) 134/95 (BP Location: Left Arm)   Pulse 73   Temp 97.6 F (36.4 C) (Oral)   Resp 15   SpO2 100%  Gen:   Awake, no distress   Resp:  Normal effort  MSK:   Moves extremities without difficulty  Other:    Medical Decision Making  Medically screening exam initiated at 11:10 PM.  Appropriate orders placed.  Nehemiah Mcfarren was informed that the remainder of the evaluation will be completed by another provider, this initial triage assessment does not replace that evaluation, and the importance of remaining in the ED until their evaluation is complete.  Patient was provided his medication will need further evaluation.   Carroll Sage, PA-C 03/21/23 2311

## 2023-03-21 NOTE — ED Triage Notes (Signed)
Pt states he is hearing voices and states he has been out of his medications at home. Pt states he last took his medications yesterday.

## 2023-03-22 NOTE — ED Provider Notes (Signed)
[place] EMERGENCY DEPARTMENT AT [facility] Provider Note   CSN: [id number] Arrival date & time: 03/21/23  2216     History  Chief Complaint  Patient presents with   Hallucinations   Medication Refill    Cole Barrett is a 37 y.o. male.  HPI   Medical history including PE, schizophrenia, homelessness presenting with complaints of auditory hallucinations.  Patient states it started earlier yesterday, states that he has been unable to take his medication as he left it at his house which is across town.  Patient does not endorse suicidal or homicidal ideations, states that the hallucinations are not telling him to hurt himself or others.  Patient states that he just needs his medication and and he will feel better.  Reviewed patient's chart has been seen multiple times for similar presentation most recently seen yesterday, was given his medication and then was discharged.  Home Medications Prior to Admission medications   Medication Sig Start Date End Date Taking? Authorizing Provider  acetaminophen (TYLENOL 8 HOUR) 650 MG CR tablet Take 1 tablet (650 mg total) by mouth every 8 (eight) hours as needed for pain. 02/14/23   Mannie Stabile, PA-C  ibuprofen (ADVIL) 600 MG tablet Take 1 tablet (600 mg total) by mouth every 6 (six) hours as needed. Patient taking differently: Take 600 mg by mouth every 6 (six) hours as needed for mild pain. 12/24/22   Harris, Cammy Copa, PA-C  naproxen (NAPROSYN) 375 MG tablet Take 1 tablet (375 mg total) by mouth 2 (two) times daily. 02/14/23   Kommor, Madison, MD  risperiDONE (RISPERDAL M-TABS) 1 MG disintegrating tablet Take 1 tablet (1 mg total) by mouth 2 (two) times daily for 10 days. 12/24/22 03/02/23  Arthor Captain, PA-C  amantadine (SYMMETREL) 100 MG capsule Take 1 capsule (100 mg total) by mouth 2 (two) times daily. Patient not taking: Reported on 11/04/2019 09/01/19 11/17/19  [name], NP      Allergies    Patient has  no known allergies.    Review of Systems   Review of Systems  Constitutional:  Negative for chills and fever.  Respiratory:  Negative for shortness of breath.   Cardiovascular:  Negative for chest pain.  Gastrointestinal:  Negative for abdominal pain.  Neurological:  Negative for headaches.  Psychiatric/Behavioral:  Positive for hallucinations.     Physical Exam Updated Vital Signs BP 121/69 (BP Location: Right Arm)   Pulse 73   Temp 97.6 F (36.4 C) (Oral)   Resp 17   SpO2 98%  Physical Exam Vitals and nursing note reviewed.  Constitutional:      General: He is not in acute distress.    Appearance: He is not ill-appearing.  HENT:     Head: Normocephalic and atraumatic.     Nose: No congestion.  Eyes:     Conjunctiva/sclera: Conjunctivae normal.  Cardiovascular:     Rate and Rhythm: Normal rate and regular rhythm.     Pulses: Normal pulses.  Pulmonary:     Effort: Pulmonary effort is normal.  Skin:    General: Skin is warm and dry.  Neurological:     Mental Status: He is alert.  Psychiatric:        Mood and Affect: Mood normal.     Comments: Patient endorses auditory hallucinations, denies any suicidal or homicidal ideations, does not appear to be responding internal stimuli, responding appropriate to all questions.     ED Results / Procedures / Treatments  Labs (all labs ordered are listed, but only abnormal results are displayed) Labs Reviewed - No data to display  EKG None  Radiology No results found.  Procedures Procedures    Medications Ordered in ED Medications  risperiDONE (RISPERDAL) tablet 1 mg (1 mg Oral Given 03/21/23 2311)    ED Course/ Medical Decision Making/ A&P                             Medical Decision Making Risk Prescription drug management.   This patient presents to the ED for concern of hallucinations, this involves an extensive number of treatment options, and is a complaint that carries with it a high risk of  complications and morbidity.  The differential diagnosis includes psychiatric emergency, metabolic derailment, withdrawals    Additional history obtained:  Additional history obtained from N/A External records from outside source obtained and reviewed including recent ER notes   Co morbidities that complicate the patient evaluation  Psychiatric disorder  Social Determinants of Health:  Homeless    Lab Tests:  I Ordered, and personally interpreted labs.  The pertinent results include: N/A   Imaging Studies ordered:  I ordered imaging studies including N/A I independently visualized and interpreted imaging which showed NA I agree with the radiologist interpretation   Cardiac Monitoring:  The patient was maintained on a cardiac monitor.  I personally viewed and interpreted the cardiac monitored which showed an underlying rhythm of: N/A   Medicines ordered and prescription drug management:  I ordered medication including risperidone I have reviewed the patients home medicines and have made adjustments as needed  Critical Interventions:  N/A   Reevaluation:  Presents with auditory hallucinations, will provide patient with his medication and reassess  Patient been observed in the ER for 6 hours, he is able to eat and drink out difficulty, states he is feeling much better and is agreement discharge at this time.  Consultations Obtained:  N/a  Test Considered:  N/a    Rule out Low suspicion patient needs to be admitted for acute psychiatric emergency as he does not appear to be responding to internal stimuli, not endorsing suicidal homicidal ideations.  I doubt withdraws nontrauma so I exam vital signs are reassuring.    Dispostion and problem list  After consideration of the diagnostic results and the patients response to treatment, I feel that the patent would benefit from discharge.  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            Final Clinical Impression(s) / ED Diagnoses Final diagnoses:  Hallucinations    Rx / DC Orders ED Discharge Orders     None         Carroll Sage, PA-C 03/22/23 0407    Mesner, Barbara Cower, MD 03/22/23 [phone]

## 2023-03-22 NOTE — Discharge Instructions (Signed)
Please continue to take your home medications.  Follow-up with [facility] behavioral health urgent care  Come back to the emergency department if you develop chest pain, shortness of breath, severe abdominal pain, uncontrolled nausea, vomiting, diarrhea.

## 2023-03-24 ENCOUNTER — Other Ambulatory Visit: Payer: Self-pay

## 2023-03-24 ENCOUNTER — Emergency Department (HOSPITAL_COMMUNITY)
Admission: EM | Admit: 2023-03-24 | Discharge: 2023-03-24 | Disposition: A | Discharge status: Home / Self Care | Payer: Self-pay | Attending: Emergency Medicine | Admitting: Emergency Medicine

## 2023-03-24 DIAGNOSIS — Z5900 Homelessness unspecified: Secondary | ICD-10-CM | POA: Insufficient documentation

## 2023-03-24 DIAGNOSIS — M79601 Pain in right arm: Secondary | ICD-10-CM | POA: Insufficient documentation

## 2023-03-24 MED ORDER — IBUPROFEN 800 MG PO TABS
800.0000 mg | ORAL_TABLET | Freq: Once | ORAL | Status: AC
  Administered 2023-03-24: 800 mg via ORAL
  Filled 2023-03-24: qty 1

## 2023-03-24 NOTE — ED Provider Notes (Signed)
[place] EMERGENCY DEPARTMENT AT [facility] Provider Note   CSN: [id number] Arrival date & time: 03/24/23  6295     History  Chief Complaint  Patient presents with   upper extremity pain     Cole Barrett is a 37 y.o. male.  The history is provided by the patient and medical records. No language interpreter was used.     37 year old male significant history of schizophrenia, homelessness, substance abuse, frequent ER visits, presenting complaining of arm pain.  Patient noticed pain to his right upper arm upon waking this morning.  States that he felt like he may have slept on it wrong and requesting for pain medication.  Denies any trauma denies any numbness no neck pain no headache.  Home Medications Prior to Admission medications   Medication Sig Start Date End Date Taking? Authorizing Provider  acetaminophen (TYLENOL 8 HOUR) 650 MG CR tablet Take 1 tablet (650 mg total) by mouth every 8 (eight) hours as needed for pain. 02/14/23   Mannie Stabile, PA-C  ibuprofen (ADVIL) 600 MG tablet Take 1 tablet (600 mg total) by mouth every 6 (six) hours as needed. Patient taking differently: Take 600 mg by mouth every 6 (six) hours as needed for mild pain. 12/24/22   Harris, Cammy Copa, PA-C  naproxen (NAPROSYN) 375 MG tablet Take 1 tablet (375 mg total) by mouth 2 (two) times daily. 02/14/23   Kommor, Madison, MD  risperiDONE (RISPERDAL M-TABS) 1 MG disintegrating tablet Take 1 tablet (1 mg total) by mouth 2 (two) times daily for 10 days. 12/24/22 03/02/23  Arthor Captain, PA-C  amantadine (SYMMETREL) 100 MG capsule Take 1 capsule (100 mg total) by mouth 2 (two) times daily. Patient not taking: Reported on 11/04/2019 09/01/19 11/17/19  [name], NP      Allergies    Patient has no known allergies.    Review of Systems   Review of Systems  All other systems reviewed and are negative.   Physical Exam Updated Vital Signs BP 130/65 (BP Location: Right Arm)    Pulse 76   Temp 97.9 F (36.6 C) (Oral)   Resp 17   SpO2 95%  Physical Exam Vitals and nursing note reviewed.  Constitutional:      General: He is not in acute distress.    Appearance: He is well-developed.  HENT:     Head: Atraumatic.  Eyes:     Conjunctiva/sclera: Conjunctivae normal.  Musculoskeletal:        General: Tenderness (Right arm: Mild tenderness about the insertion of the bicep without any overlying skin changes.  Patient able to flex/extend elbow without difficulty.  No deformity, full range of motion about the right shoulder and right wrist.) present.     Cervical back: Neck supple.  Skin:    Capillary Refill: Capillary refill takes less than 2 seconds.     Findings: No rash.  Neurological:     Mental Status: He is alert.     ED Results / Procedures / Treatments   Labs (all labs ordered are listed, but only abnormal results are displayed) Labs Reviewed - No data to display  EKG None  Radiology No results found.  Procedures Procedures    Medications Ordered in ED Medications - No data to display  ED Course/ Medical Decision Making/ A&P                             Medical  Decision Making  BP 130/65 (BP Location: Right Arm)   Pulse 76   Temp 97.9 F (36.6 C) (Oral)   Resp 17   SpO2 95%   12:60 AM  37 year old male significant history of schizophrenia, homelessness, substance abuse, frequent ER visits, presenting complaining of arm pain.  Patient noticed pain to his right upper arm upon waking this morning.  States that he felt like he may have slept on it wrong and requesting for pain medication.  Denies any trauma denies any numbness no neck pain no headache.  On exam, patient has some mild tenderness about the right upper arm at the bicep insertion point however there are no overlying skin changes.  He has full range of his shoulder and elbow wrist and he is neurovascular intact.  No edema noted.  DDx: Strain, sprain, cellulitis, septic  joint, DVT  Doubt acute emergent medical condition, ibuprofen given for pain, patient stable to be discharged.  Imaging including x-ray of the right humerus and right elbow considered but not performed as patient denies any trauma.  EMR reviewed patient seen in the ED multiple times for various pain related complaints.        Final Clinical Impression(s) / ED Diagnoses Final diagnoses:  Right arm pain    Rx / DC Orders ED Discharge Orders     None         Fayrene Helper, PA-C 03/24/23 1610    Arby Barrette, MD 03/28/23 1259

## 2023-03-24 NOTE — Discharge Instructions (Signed)
You may take over-the-counter Tylenol or ibuprofen as needed for your aches and pain.

## 2023-03-24 NOTE — ED Triage Notes (Signed)
C/o right upper arm upon waking this am. Pt states "I need some pain meds for it, I slept on it wrong." Denies fall/trauma

## 2023-03-27 ENCOUNTER — Other Ambulatory Visit: Payer: Self-pay

## 2023-03-27 ENCOUNTER — Encounter (HOSPITAL_COMMUNITY): Payer: Self-pay

## 2023-03-27 ENCOUNTER — Emergency Department (HOSPITAL_COMMUNITY)
Admission: EM | Admit: 2023-03-27 | Discharge: 2023-03-28 | Disposition: A | Discharge status: Home / Self Care | Payer: Self-pay | Attending: Emergency Medicine | Admitting: Emergency Medicine

## 2023-03-27 DIAGNOSIS — R52 Pain, unspecified: Secondary | ICD-10-CM

## 2023-03-27 DIAGNOSIS — M791 Myalgia, unspecified site: Secondary | ICD-10-CM | POA: Insufficient documentation

## 2023-03-27 MED ORDER — RISPERIDONE 1 MG PO TABS
1.0000 mg | ORAL_TABLET | Freq: Once | ORAL | Status: AC
  Administered 2023-03-28: 1 mg via ORAL
  Filled 2023-03-27: qty 1

## 2023-03-27 MED ORDER — ACETAMINOPHEN 325 MG PO TABS
650.0000 mg | ORAL_TABLET | Freq: Once | ORAL | Status: AC
  Administered 2023-03-28: 650 mg via ORAL
  Filled 2023-03-27: qty 2

## 2023-03-27 NOTE — ED Triage Notes (Signed)
C/O hearing voices and requesting to take Risperdal.   Denies SI, Denies HI

## 2023-03-27 NOTE — ED Provider Notes (Signed)
[place] EMERGENCY DEPARTMENT AT [facility] Provider Note   CSN: [id number] Arrival date & time: 03/27/23  2233     History {Add pertinent medical, surgical, social history, OB history to HPI:1} No chief complaint on file.   [name] is a 37 y.o. male.  37 year old male with past medical history of schizophrenia, malingering, hallucinations and homelessness presents with complaint of needing some place to rest his body and his head for a moment.  States that he has not had a chance to go home to take his Risperdal and Tylenol, requesting dose of same.  Also requesting something to eat, no acute complaints otherwise.       Home Medications Prior to Admission medications   Medication Sig Start Date End Date Taking? Authorizing Provider  acetaminophen (TYLENOL 8 HOUR) 650 MG CR tablet Take 1 tablet (650 mg total) by mouth every 8 (eight) hours as needed for pain. 02/14/23   Mannie Stabile, PA-C  ibuprofen (ADVIL) 600 MG tablet Take 1 tablet (600 mg total) by mouth every 6 (six) hours as needed. Patient taking differently: Take 600 mg by mouth every 6 (six) hours as needed for mild pain. 12/24/22   Harris, Cammy Copa, PA-C  naproxen (NAPROSYN) 375 MG tablet Take 1 tablet (375 mg total) by mouth 2 (two) times daily. 02/14/23   Kommor, Madison, MD  risperiDONE (RISPERDAL M-TABS) 1 MG disintegrating tablet Take 1 tablet (1 mg total) by mouth 2 (two) times daily for 10 days. 12/24/22 03/02/23  Arthor Captain, PA-C  amantadine (SYMMETREL) 100 MG capsule Take 1 capsule (100 mg total) by mouth 2 (two) times daily. Patient not taking: Reported on 11/04/2019 09/01/19 11/17/19  [name], NP      Allergies    Patient has no known allergies.    Review of Systems   Review of Systems Negative except as per HPI Physical Exam Updated Vital Signs BP 132/79 (BP Location: Right Arm)   Pulse 81   Temp 98.3 F (36.8 C) (Oral)   Resp 20   Ht 5\' 10"  (1.778 m)   Wt  99.9 kg   SpO2 95%   BMI 31.60 kg/m  Physical Exam Vitals and nursing note reviewed.  Constitutional:      General: He is not in acute distress.    Appearance: He is well-developed. He is not diaphoretic.  HENT:     Head: Normocephalic and atraumatic.  Pulmonary:     Effort: Pulmonary effort is normal.  Neurological:     Mental Status: He is alert and oriented to person, place, and time.  Psychiatric:        Behavior: Behavior normal.     ED Results / Procedures / Treatments   Labs (all labs ordered are listed, but only abnormal results are displayed) Labs Reviewed - No data to display  EKG None  Radiology No results found.  Procedures Procedures  {Document cardiac monitor, telemetry assessment procedure when appropriate:1}  Medications Ordered in ED Medications  acetaminophen (TYLENOL) tablet 650 mg (has no administration in time range)  risperiDONE (RISPERDAL) tablet 1 mg (has no administration in time range)    ED Course/ Medical Decision Making/ A&P   {   Click here for ABCD2, HEART and other calculatorsREFRESH Note before signing :1}                          Medical Decision Making Risk OTC drugs. Prescription drug management.  37 year old male well-known to the department with 103 ER visits in the past 6 months with request for someplace to rest for a moment and dose of his Risperdal and Tylenol.  No acute complaints.  Plan is to provide Tylenol and Risperdal, will outpatient to rest for a moment before discharge.  {Document critical care time when appropriate:1} {Document review of labs and clinical decision tools ie heart score, Chads2Vasc2 etc:1}  {Document your independent review of radiology images, and any outside records:1} {Document your discussion with family members, caretakers, and with consultants:1} {Document social determinants of health affecting pt's care:1} {Document your decision making why or why not admission, treatments were  needed:1} Final Clinical Impression(s) / ED Diagnoses Final diagnoses:  None    Rx / DC Orders ED Discharge Orders     None

## 2023-03-28 NOTE — Discharge Instructions (Signed)
Follow-up with your care team 

## 2023-04-01 ENCOUNTER — Encounter (HOSPITAL_COMMUNITY): Payer: Self-pay | Admitting: *Deleted

## 2023-04-01 ENCOUNTER — Other Ambulatory Visit: Payer: Self-pay

## 2023-04-01 ENCOUNTER — Emergency Department (HOSPITAL_COMMUNITY)
Admission: EM | Admit: 2023-04-01 | Discharge: 2023-04-02 | Disposition: A | Discharge status: Home / Self Care | Payer: Self-pay | Attending: Emergency Medicine | Admitting: Emergency Medicine

## 2023-04-01 DIAGNOSIS — R443 Hallucinations, unspecified: Secondary | ICD-10-CM | POA: Insufficient documentation

## 2023-04-01 DIAGNOSIS — R799 Abnormal finding of blood chemistry, unspecified: Secondary | ICD-10-CM | POA: Insufficient documentation

## 2023-04-01 DIAGNOSIS — Z7689 Persons encountering health services in other specified circumstances: Secondary | ICD-10-CM

## 2023-04-01 DIAGNOSIS — Z760 Encounter for issue of repeat prescription: Secondary | ICD-10-CM | POA: Insufficient documentation

## 2023-04-01 NOTE — ED Triage Notes (Signed)
Pt having auditory hallucinations; requesting risperdal; last dose last night

## 2023-04-02 ENCOUNTER — Encounter (HOSPITAL_COMMUNITY): Payer: Self-pay

## 2023-04-02 ENCOUNTER — Emergency Department (HOSPITAL_COMMUNITY)
Admission: EM | Admit: 2023-04-02 | Discharge: 2023-04-02 | Disposition: A | Discharge status: Home / Self Care | Payer: Self-pay | Attending: Emergency Medicine | Admitting: Emergency Medicine

## 2023-04-02 ENCOUNTER — Other Ambulatory Visit: Payer: Self-pay

## 2023-04-02 DIAGNOSIS — Z7689 Persons encountering health services in other specified circumstances: Secondary | ICD-10-CM | POA: Insufficient documentation

## 2023-04-02 LAB — I-STAT CHEM 8, ED
BUN: 13 mg/dL (ref 6–20)
Calcium, Ion: 1.05 mmol/L — ABNORMAL LOW (ref 1.15–1.40)
Chloride: 106 mmol/L (ref 98–111)
Creatinine, Ser: 0.9 mg/dL (ref 0.61–1.24)
Glucose, Bld: 95 mg/dL (ref 70–99)
HCT: 39 % (ref 39.0–52.0)
Hemoglobin: 13.3 g/dL (ref 13.0–17.0)
Potassium: 3.7 mmol/L (ref 3.5–5.1)
Sodium: 138 mmol/L (ref 135–145)
TCO2: 22 mmol/L (ref 22–32)

## 2023-04-02 LAB — CBG MONITORING, ED: Glucose-Capillary: 113 mg/dL — ABNORMAL HIGH (ref 70–99)

## 2023-04-02 MED ORDER — RISPERIDONE 0.5 MG PO TABS
1.0000 mg | ORAL_TABLET | Freq: Once | ORAL | Status: AC
  Administered 2023-04-02: 1 mg via ORAL
  Filled 2023-04-02: qty 2

## 2023-04-02 MED ORDER — RISPERIDONE 0.5 MG PO TBDP
1.0000 mg | ORAL_TABLET | Freq: Two times a day (BID) | ORAL | Status: DC
  Administered 2023-04-02: 1 mg via ORAL
  Filled 2023-04-02: qty 2

## 2023-04-02 NOTE — ED Triage Notes (Signed)
Pt. Arrives POV for a risperidone pill because he is having auditory hallucinations

## 2023-04-02 NOTE — ED Provider Notes (Signed)
[place] EMERGENCY DEPARTMENT AT [facility] Provider Note   CSN: [id number] Arrival date & time: 04/01/23  2324     History  Chief Complaint  Patient presents with  . Medication Refill    Cole Barrett is a 37 y.o. male.  37 year old male with history of HLD, schizophrenia and paranoid behavior, and homelessness presents to the emergency department requesting a dose of his Risperdal.  He states that he is having hallucinations.  Patient with no other acute complaints.  The history is provided by the patient. No language interpreter was used.  Medication Refill      Home Medications Prior to Admission medications   Medication Sig Start Date End Date Taking? Authorizing Provider  acetaminophen (TYLENOL 8 HOUR) 650 MG CR tablet Take 1 tablet (650 mg total) by mouth every 8 (eight) hours as needed for pain. 02/14/23   Mannie Stabile, PA-C  ibuprofen (ADVIL) 600 MG tablet Take 1 tablet (600 mg total) by mouth every 6 (six) hours as needed. Patient taking differently: Take 600 mg by mouth every 6 (six) hours as needed for mild pain. 12/24/22   Harris, Cammy Copa, PA-C  naproxen (NAPROSYN) 375 MG tablet Take 1 tablet (375 mg total) by mouth 2 (two) times daily. 02/14/23   Kommor, Madison, MD  risperiDONE (RISPERDAL M-TABS) 1 MG disintegrating tablet Take 1 tablet (1 mg total) by mouth 2 (two) times daily for 10 days. 12/24/22 03/02/23  Arthor Captain, PA-C  amantadine (SYMMETREL) 100 MG capsule Take 1 capsule (100 mg total) by mouth 2 (two) times daily. Patient not taking: Reported on 11/04/2019 09/01/19 11/17/19  [name], NP      Allergies    Patient has no known allergies.    Review of Systems   Review of Systems Ten systems reviewed and are negative for acute change, except as noted in the HPI.    Physical Exam Updated Vital Signs BP (!) 145/83   Pulse 63   Temp 98.3 F (36.8 C) (Oral)   Resp 16   SpO2 97%   Physical Exam Vitals and  nursing note reviewed.  Constitutional:      General: He is not in acute distress.    Appearance: He is well-developed. He is not diaphoretic.     Comments: Disheveled appearing. Foul smelling. Incredibly somnolent, but wakes briefly to loud voice and physical stimuli  HENT:     Head: Normocephalic and atraumatic.  Eyes:     General: No scleral icterus.    Conjunctiva/sclera: Conjunctivae normal.  Pulmonary:     Effort: Pulmonary effort is normal. No respiratory distress.     Comments: Respirations even and unlabored Musculoskeletal:        General: Normal range of motion.     Cervical back: Normal range of motion.  Skin:    General: Skin is warm and dry.     Coloration: Skin is not pale.     Findings: No erythema or rash.  Neurological:     Mental Status: He is alert and oriented to person, place, and time.     Coordination: Coordination normal.  Psychiatric:        Behavior: Behavior normal.     ED Results / Procedures / Treatments   Labs (all labs ordered are listed, but only abnormal results are displayed) Labs Reviewed  CBG MONITORING, ED - Abnormal; Notable for the following components:      Result Value   Glucose-Capillary 113 (*)  All other components within normal limits  I-STAT CHEM 8, ED - Abnormal; Notable for the following components:   Calcium, Ion 1.05 (*)    All other components within normal limits    EKG None  Radiology No results found.  Procedures Procedures    Medications Ordered in ED Medications  risperiDONE (RISPERDAL M-TABS) disintegrating tablet 1 mg (1 mg Oral Given 04/02/23 0027)    ED Course/ Medical Decision Making/ A&P Clinical Course as of 04/02/23 0501  Sun Apr 02, 2023  0029 Basic labs ordered given degree of somnolence as this is more pronounced than the patient's typical baseline. CBG normal. [KH]  0106 Labs reassuring. Will allow patient to rest in ED with anticipated discharge later this AM. [KH]  0501 Patient has  slept in the department without clinical change or decompensation. [KH]    Clinical Course User Index [KH] [name], PA-C                             Medical Decision Making Risk Prescription drug management.   This patient presents to the ED for concern of hallucinations, this involves an extensive number of treatment options, and is a complaint that carries with it a high risk of complications and morbidity.    Co morbidities that complicate the patient evaluation  Schizophrenia   Lab Tests:  I Ordered, and personally interpreted labs.  The pertinent results include:  Normal CBG and chemisty   Cardiac Monitoring:  The patient was maintained on a cardiac monitor.  I personally viewed and interpreted the cardiac monitored which showed an underlying rhythm of: NSR   Medicines ordered and prescription drug management:  I ordered medication including Risperdal for medication compliance, hallucinations  Reevaluation of the patient after these medicines showed that the patient stayed the same I have reviewed the patients home medicines and have made adjustments as needed   Test Considered:  Ethanol  UDS   Problem List / ED Course:  As above   Reevaluation:  After the interventions noted above, I reevaluated the patient and found that they have :stayed the same   Social Determinants of Health:  Homelessness    Dispostion:  After consideration of the diagnostic results and the patients response to treatment, I feel that the patent would benefit from outpatient psychiatric f/u. Return precautions provided. Patient discharged in stable condition with no unaddressed concerns.           Final Clinical Impression(s) / ED Diagnoses Final diagnoses:  Encounter for medication administration    Rx / DC Orders ED Discharge Orders     None         [name], PA-C 04/02/23 0501    Shon Baton, MD 04/02/23 [phone]

## 2023-04-02 NOTE — ED Provider Notes (Signed)
[place] EMERGENCY DEPARTMENT AT [facility] Provider Note   CSN: [id number] Arrival date & time: 04/02/23  2203     History  Chief Complaint  Patient presents with   Medication Refill    Cole Barrett is a 37 y.o. male.  Patient with history of schizophrenia, malingering, hallucinations and homelessness presents today requesting medication refill. He states that he is having auditory hallucinations and needs a dose of his ome Risperdal. His last dose was in the ER last night when he presented for the same complaint. He denies any other acute complaints.    The history is provided by the patient. No language interpreter was used.  Medication Refill      Home Medications Prior to Admission medications   Medication Sig Start Date End Date Taking? Authorizing Provider  acetaminophen (TYLENOL 8 HOUR) 650 MG CR tablet Take 1 tablet (650 mg total) by mouth every 8 (eight) hours as needed for pain. 02/14/23   Mannie Stabile, PA-C  ibuprofen (ADVIL) 600 MG tablet Take 1 tablet (600 mg total) by mouth every 6 (six) hours as needed. Patient taking differently: Take 600 mg by mouth every 6 (six) hours as needed for mild pain. 12/24/22   Harris, Cammy Copa, PA-C  naproxen (NAPROSYN) 375 MG tablet Take 1 tablet (375 mg total) by mouth 2 (two) times daily. 02/14/23   Kommor, Madison, MD  risperiDONE (RISPERDAL M-TABS) 1 MG disintegrating tablet Take 1 tablet (1 mg total) by mouth 2 (two) times daily for 10 days. 12/24/22 03/02/23  Arthor Captain, PA-C  amantadine (SYMMETREL) 100 MG capsule Take 1 capsule (100 mg total) by mouth 2 (two) times daily. Patient not taking: Reported on 11/04/2019 09/01/19 11/17/19  [name], NP      Allergies    Patient has no known allergies.    Review of Systems   Review of Systems  All other systems reviewed and are negative.   Physical Exam Updated Vital Signs There were no vitals taken for this visit. Physical Exam Vitals  and nursing note reviewed.  Constitutional:      General: He is not in acute distress.    Appearance: Normal appearance. He is normal weight. He is not ill-appearing, toxic-appearing or diaphoretic.  HENT:     Head: Normocephalic and atraumatic.  Cardiovascular:     Rate and Rhythm: Normal rate.  Pulmonary:     Effort: Pulmonary effort is normal. No respiratory distress.  Musculoskeletal:        General: Normal range of motion.     Cervical back: Normal range of motion.  Skin:    General: Skin is warm and dry.  Neurological:     General: No focal deficit present.     Mental Status: He is alert.  Psychiatric:        Mood and Affect: Mood normal.        Behavior: Behavior normal.     ED Results / Procedures / Treatments   Labs (all labs ordered are listed, but only abnormal results are displayed) Labs Reviewed - No data to display  EKG None  Radiology No results found.  Procedures Procedures    Medications Ordered in ED Medications  risperiDONE (RISPERDAL) tablet 1 mg (has no administration in time range)    ED Course/ Medical Decision Making/ A&P  Medical Decision Making Risk Prescription drug management.   Patient well known to the emergency department with 101 ER visits in the past 6 months presents today requesting a dose of his Risperdal.  He is afebrile, nontoxic-appearing, in no acute distress with reassuring vital signs.  He had laboratory evaluation last night that was unremarkable for acute findings.  Patient denies any acute complaints.  Given risperidone per his request with improvement of symptoms.  Evaluation and diagnostic testing in the emergency department does not suggest an emergent condition requiring admission or immediate intervention beyond what has been performed at this time.  Plan for discharge with close PCP follow-up.  Patient is understanding and amenable with plan, educated on red flag symptoms that would  prompt immediate return.  Patient discharged in stable condition.  Final Clinical Impression(s) / ED Diagnoses Final diagnoses:  Encounter for medication administration    Rx / DC Orders ED Discharge Orders     None     An After Visit Summary was printed and given to the patient.     Vear Clock 04/02/23 2316    Tilden Fossa, MD 04/03/23 [phone]

## 2023-04-04 ENCOUNTER — Other Ambulatory Visit: Payer: Self-pay

## 2023-04-04 ENCOUNTER — Emergency Department (HOSPITAL_COMMUNITY)
Admission: EM | Admit: 2023-04-04 | Discharge: 2023-04-04 | Disposition: A | Discharge status: Home / Self Care | Payer: Self-pay | Attending: Emergency Medicine | Admitting: Emergency Medicine

## 2023-04-04 ENCOUNTER — Encounter (HOSPITAL_COMMUNITY): Payer: Self-pay

## 2023-04-04 DIAGNOSIS — R443 Hallucinations, unspecified: Secondary | ICD-10-CM | POA: Insufficient documentation

## 2023-04-04 DIAGNOSIS — Z79899 Other long term (current) drug therapy: Secondary | ICD-10-CM

## 2023-04-04 DIAGNOSIS — Z760 Encounter for issue of repeat prescription: Secondary | ICD-10-CM | POA: Insufficient documentation

## 2023-04-04 MED ORDER — RISPERIDONE 0.5 MG PO TABS
1.0000 mg | ORAL_TABLET | Freq: Once | ORAL | Status: AC
  Administered 2023-04-04: 1 mg via ORAL
  Filled 2023-04-04: qty 2

## 2023-04-04 NOTE — ED Triage Notes (Signed)
Patient reports he is here for a dose of his Risperdal for his hallucinations.

## 2023-04-04 NOTE — ED Provider Notes (Signed)
[place] EMERGENCY DEPARTMENT AT [facility] Provider Note   CSN: [id number] Arrival date & time: 04/04/23  7829     History  Chief Complaint  Patient presents with   Medication Refill    Cole Barrett is a 37 y.o. male.  The history is provided by the patient and medical records.  Medication Refill  37 y.o. M here requesting dose of his risperdal.  Reports ongoing, chronic hallucinations.  No new changes.  Denies SI/HI.  He had his risperdal last night, however states he cannot get to the pharmacy right now to get his refill.    Patient well known to this facility, 100 visits in the past 6 months for similar complaints.    Home Medications Prior to Admission medications   Medication Sig Start Date End Date Taking? Authorizing Provider  acetaminophen (TYLENOL 8 HOUR) 650 MG CR tablet Take 1 tablet (650 mg total) by mouth every 8 (eight) hours as needed for pain. 02/14/23   Mannie Stabile, PA-C  ibuprofen (ADVIL) 600 MG tablet Take 1 tablet (600 mg total) by mouth every 6 (six) hours as needed. Patient taking differently: Take 600 mg by mouth every 6 (six) hours as needed for mild pain. 12/24/22   Harris, Cammy Copa, PA-C  naproxen (NAPROSYN) 375 MG tablet Take 1 tablet (375 mg total) by mouth 2 (two) times daily. 02/14/23   Kommor, Madison, MD  risperiDONE (RISPERDAL M-TABS) 1 MG disintegrating tablet Take 1 tablet (1 mg total) by mouth 2 (two) times daily for 10 days. 12/24/22 03/02/23  Arthor Captain, PA-C  amantadine (SYMMETREL) 100 MG capsule Take 1 capsule (100 mg total) by mouth 2 (two) times daily. Patient not taking: Reported on 11/04/2019 09/01/19 11/17/19  [name], NP      Allergies    Patient has no known allergies.    Review of Systems   Review of Systems  Constitutional:        Medication needs  All other systems reviewed and are negative.   Physical Exam Updated Vital Signs BP 138/85   Pulse 94   Temp 98.1 F (36.7 C) (Oral)    Resp 18   Ht 5\' 10"  (1.778 m)   Wt 99.9 kg   SpO2 99%   BMI 31.60 kg/m   Physical Exam Vitals and nursing note reviewed.  Constitutional:      Appearance: He is well-developed.     Comments: Sleeping, awoken for exam, appears at baseline  HENT:     Head: Normocephalic and atraumatic.  Eyes:     Conjunctiva/sclera: Conjunctivae normal.     Pupils: Pupils are equal, round, and reactive to light.  Cardiovascular:     Rate and Rhythm: Normal rate and regular rhythm.     Heart sounds: Normal heart sounds.  Pulmonary:     Effort: Pulmonary effort is normal.     Breath sounds: Normal breath sounds.  Abdominal:     General: Bowel sounds are normal.     Palpations: Abdomen is soft.  Musculoskeletal:        General: Normal range of motion.     Cervical back: Normal range of motion.  Skin:    General: Skin is warm and dry.  Neurological:     Mental Status: He is alert and oriented to person, place, and time.     ED Results / Procedures / Treatments   Labs (all labs ordered are listed, but only abnormal results are displayed) Labs Reviewed -  No data to display  EKG None  Radiology No results found.  Procedures Procedures    Medications Ordered in ED Medications  risperiDONE (RISPERDAL) tablet 1 mg (1 mg Oral Given 04/04/23 0323)    ED Course/ Medical Decision Making/ A&P                             Medical Decision Making Risk Prescription drug management.   37 year old male presenting to the ED requesting dose of his Risperdal.  Well-known to this facility, this is his 100th visit in the past 6 months or similar related complaints.  He appears at his baseline.  VSS.  Given medication as requested, stable for discharge.  Final Clinical Impression(s) / ED Diagnoses Final diagnoses:  Medication management    Rx / DC Orders ED Discharge Orders     None         Garlon Hatchet, PA-C 04/04/23 0331    [name], MD 04/04/23 [phone]

## 2023-04-04 NOTE — ED Notes (Signed)
Patient given discharge instructions, verbalized understanding. Patient ambulatory out of ED by self.

## 2023-04-05 ENCOUNTER — Other Ambulatory Visit: Payer: Self-pay

## 2023-04-05 ENCOUNTER — Encounter (HOSPITAL_COMMUNITY): Payer: Self-pay

## 2023-04-05 ENCOUNTER — Emergency Department (HOSPITAL_COMMUNITY)
Admission: EM | Admit: 2023-04-05 | Discharge: 2023-04-05 | Disposition: A | Discharge status: Home / Self Care | Payer: Self-pay | Attending: Emergency Medicine | Admitting: Emergency Medicine

## 2023-04-05 DIAGNOSIS — Z760 Encounter for issue of repeat prescription: Secondary | ICD-10-CM | POA: Insufficient documentation

## 2023-04-05 DIAGNOSIS — Z79899 Other long term (current) drug therapy: Secondary | ICD-10-CM

## 2023-04-05 MED ORDER — RISPERIDONE 1 MG PO TABS
1.0000 mg | ORAL_TABLET | Freq: Once | ORAL | Status: AC
  Administered 2023-04-05: 1 mg via ORAL
  Filled 2023-04-05: qty 1

## 2023-04-05 NOTE — ED Notes (Signed)
Patient verbalizes understanding of discharge instructions. Opportunity for questioning and answers were provided. Armband removed by staff, pt discharged from ED. Ambulated out to lobby  

## 2023-04-05 NOTE — ED Provider Notes (Signed)
[name] EMERGENCY DEPARTMENT AT [facility] Provider Note   CSN: [id number] Arrival date & time: 04/05/23  0014     History  Chief Complaint  Patient presents with   Medication Refill    Cole Barrett is a 37 y.o. male.   Medication Refill  37 y.o. M here requesting dose of risperdal.  He is well known to [place], seen nightly for same.  He has no additional complaints today.    101 visits in the past 6 months for similar.  Home Medications Prior to Admission medications   Medication Sig Start Date End Date Taking? Authorizing Provider  acetaminophen (TYLENOL 8 HOUR) 650 MG CR tablet Take 1 tablet (650 mg total) by mouth every 8 (eight) hours as needed for pain. 02/14/23   Mannie Stabile, PA-C  ibuprofen (ADVIL) 600 MG tablet Take 1 tablet (600 mg total) by mouth every 6 (six) hours as needed. Patient taking differently: Take 600 mg by mouth every 6 (six) hours as needed for mild pain. 12/24/22   Harris, Cammy Copa, PA-C  naproxen (NAPROSYN) 375 MG tablet Take 1 tablet (375 mg total) by mouth 2 (two) times daily. 02/14/23   Kommor, Madison, MD  risperiDONE (RISPERDAL M-TABS) 1 MG disintegrating tablet Take 1 tablet (1 mg total) by mouth 2 (two) times daily for 10 days. 12/24/22 03/02/23  Arthor Captain, PA-C  amantadine (SYMMETREL) 100 MG capsule Take 1 capsule (100 mg total) by mouth 2 (two) times daily. Patient not taking: Reported on 11/04/2019 09/01/19 11/17/19  [name], NP      Allergies    Patient has no known allergies.    Review of Systems   Review of Systems  Constitutional:        Medication needs  All other systems reviewed and are negative.   Physical Exam Updated Vital Signs BP 129/69 (BP Location: Right Arm)   Pulse 79   Temp 98.1 F (36.7 C) (Oral)   Resp 18   SpO2 97%   Physical Exam Vitals and nursing note reviewed.  Constitutional:      Appearance: He is well-developed.     Comments: Appears at baseline  HENT:      Head: Normocephalic and atraumatic.  Eyes:     Conjunctiva/sclera: Conjunctivae normal.     Pupils: Pupils are equal, round, and reactive to light.  Cardiovascular:     Rate and Rhythm: Normal rate and regular rhythm.     Heart sounds: Normal heart sounds.  Pulmonary:     Effort: Pulmonary effort is normal.     Breath sounds: Normal breath sounds.  Abdominal:     General: Bowel sounds are normal.     Palpations: Abdomen is soft.  Musculoskeletal:        General: Normal range of motion.     Cervical back: Normal range of motion.  Skin:    General: Skin is warm and dry.  Neurological:     Mental Status: He is alert and oriented to person, place, and time.     ED Results / Procedures / Treatments   Labs (all labs ordered are listed, but only abnormal results are displayed) Labs Reviewed - No data to display  EKG None  Radiology No results found.  Procedures Procedures    Medications Ordered in ED Medications  risperiDONE (RISPERDAL) tablet 1 mg (has no administration in time range)    ED Course/ Medical Decision Making/ A&P  Medical Decision Making Risk Prescription drug management.   37 y.o. M here requesting nightly dose of risperdal.  Well known to [place] for same, 101 visits in the past 6 months.  Given risperdal as requested, no additional complaints.  Stable for discharge.    Final Clinical Impression(s) / ED Diagnoses Final diagnoses:  Medication management    Rx / DC Orders ED Discharge Orders     None         Garlon Hatchet, PA-C 04/05/23 0205    Zadie Rhine, MD 04/05/23 [phone]

## 2023-04-05 NOTE — ED Triage Notes (Signed)
Pt hearing voices and is unable to get to his medication which is across town. Pt here for dose of risperdal. Unsure of dose. Pt denies SI/HI. Cooperative in triage

## 2023-04-07 ENCOUNTER — Other Ambulatory Visit: Payer: Self-pay

## 2023-04-07 ENCOUNTER — Emergency Department (HOSPITAL_COMMUNITY)
Admission: EM | Admit: 2023-04-07 | Discharge: 2023-04-08 | Disposition: A | Discharge status: Home / Self Care | Payer: Self-pay | Attending: Student | Admitting: Student

## 2023-04-07 DIAGNOSIS — R443 Hallucinations, unspecified: Secondary | ICD-10-CM | POA: Insufficient documentation

## 2023-04-07 MED ORDER — RISPERIDONE 0.5 MG PO TABS
1.0000 mg | ORAL_TABLET | Freq: Once | ORAL | Status: AC
  Administered 2023-04-07: 1 mg via ORAL
  Filled 2023-04-07: qty 2

## 2023-04-07 NOTE — ED Triage Notes (Signed)
Pt states that he is hearing voices and requesting to talk to behavioral health. States that he usually takes risperidone, but his medication is [place] the other side of town and he was unable to take it today. States voices are saying that they're going to kill him. Denies SI/HI. Calm and cooperative.

## 2023-04-07 NOTE — ED Provider Notes (Signed)
[place] EMERGENCY DEPARTMENT AT [facility] Provider Note   CSN: [id number] Arrival date & time: 04/07/23  2143     History {Add pertinent medical, surgical, social history, OB history to HPI:1} Chief Complaint  Patient presents with  . Hallucinations    Cole Barrett is a 37 y.o. male.  HPI   Medical history including PE, alcohol dependency, schizophrenia, housing insecurity presenting with complaints of needing his medication.  Patient states that he is working across town and unable to take his risperidone.  States that he started to hear some voices in his head, though not telling to hurt himself or others.  He states that he just needs medication.  He has no other complaints.  Reviewed patient's chart has been seen here multiple times for similar presentation most recently seen yesterday for similar complaint was given risperidone and was later discharged home.  Home Medications Prior to Admission medications   Medication Sig Start Date End Date Taking? Authorizing Provider  acetaminophen (TYLENOL 8 HOUR) 650 MG CR tablet Take 1 tablet (650 mg total) by mouth every 8 (eight) hours as needed for pain. 02/14/23   Mannie Stabile, PA-C  ibuprofen (ADVIL) 600 MG tablet Take 1 tablet (600 mg total) by mouth every 6 (six) hours as needed. Patient taking differently: Take 600 mg by mouth every 6 (six) hours as needed for mild pain. 12/24/22   Harris, Cammy Copa, PA-C  naproxen (NAPROSYN) 375 MG tablet Take 1 tablet (375 mg total) by mouth 2 (two) times daily. 02/14/23   Kommor, Madison, MD  risperiDONE (RISPERDAL M-TABS) 1 MG disintegrating tablet Take 1 tablet (1 mg total) by mouth 2 (two) times daily for 10 days. 12/24/22 03/02/23  Arthor Captain, PA-C  amantadine (SYMMETREL) 100 MG capsule Take 1 capsule (100 mg total) by mouth 2 (two) times daily. Patient not taking: Reported on 11/04/2019 09/01/19 11/17/19  [name], NP      Allergies    Patient has no  known allergies.    Review of Systems   Review of Systems  Constitutional:  Negative for chills and fever.  Respiratory:  Negative for shortness of breath.   Cardiovascular:  Negative for chest pain.  Gastrointestinal:  Negative for abdominal pain.  Neurological:  Negative for headaches.  Psychiatric/Behavioral:  Positive for hallucinations.     Physical Exam Updated Vital Signs BP 133/76 (BP Location: Left Arm)   Pulse 86   Temp 98.5 F (36.9 C) (Oral)   Resp 16   Ht 5\' 10"  (1.778 m)   Wt 99.9 kg   SpO2 98%   BMI 31.60 kg/m  Physical Exam Vitals and nursing note reviewed.  Constitutional:      General: He is not in acute distress.    Appearance: He is not ill-appearing.  HENT:     Head: Normocephalic and atraumatic.     Nose: No congestion.  Eyes:     Conjunctiva/sclera: Conjunctivae normal.  Cardiovascular:     Rate and Rhythm: Normal rate and regular rhythm.     Pulses: Normal pulses.     Heart sounds: No murmur heard.    No friction rub. No gallop.  Pulmonary:     Effort: No respiratory distress.     Breath sounds: No wheezing, rhonchi or rales.  Skin:    General: Skin is warm and dry.  Neurological:     Mental Status: He is alert.     Comments: Denies suicidal or homicidal ideations, makes good  eye contact, has good insight, responding appropriately, endorses auditory hallucinations.  Psychiatric:        Mood and Affect: Mood normal.    ED Results / Procedures / Treatments   Labs (all labs ordered are listed, but only abnormal results are displayed) Labs Reviewed - No data to display  EKG None  Radiology No results found.  Procedures Procedures  {Document cardiac monitor, telemetry assessment procedure when appropriate:1}  Medications Ordered in ED Medications  risperiDONE (RISPERDAL) tablet 1 mg (1 mg Oral Given 04/07/23 2318)    ED Course/ Medical Decision Making/ A&P   {   Click here for ABCD2, HEART and other calculatorsREFRESH Note  before signing :1}                          Medical Decision Making Risk Prescription drug management.   This patient presents to the ED for concern of hallucination, this involves an extensive number of treatment options, and is a complaint that carries with it a high risk of complications and morbidity.  The differential diagnosis includes psychiatric emergency, metabolic derangements, withdrawals    Additional history obtained:  Additional history obtained from *** External records from outside source obtained and reviewed including ***   Co morbidities that complicate the patient evaluation  ***  Social Determinants of Health:  ***    Lab Tests:  I Ordered, and personally interpreted labs.  The pertinent results include:  ***   Imaging Studies ordered:  I ordered imaging studies including ***  I independently visualized and interpreted imaging which showed *** I agree with the radiologist interpretation   Cardiac Monitoring:  The patient was maintained on a cardiac monitor.  I personally viewed and interpreted the cardiac monitored which showed an underlying rhythm of: ***   Medicines ordered and prescription drug management:  I ordered medication including ***  for ***  I have reviewed the patients home medicines and have made adjustments as needed  Critical Interventions:  ***   Reevaluation:  After the interventions noted above, I reevaluated the patient and found that they have :{resolved/improved/worsened:23923::"improved"}  Consultations Obtained:  I requested consultation with the ***,  and discussed lab and imaging findings as well as pertinent plan - they recommend: ***    Test Considered:  ***    Rule out ****    Dispostion and problem list  After consideration of the diagnostic results and the patients response to treatment, I feel that the patent would benefit from ***.       {Document critical care time when  appropriate:1} {Document review of labs and clinical decision tools ie heart score, Chads2Vasc2 etc:1}  {Document your independent review of radiology images, and any outside records:1} {Document your discussion with family members, caretakers, and with consultants:1} {Document social determinants of health affecting pt's care:1} {Document your decision making why or why not admission, treatments were needed:1} Final Clinical Impression(s) / ED Diagnoses Final diagnoses:  None    Rx / DC Orders ED Discharge Orders     None

## 2023-04-08 ENCOUNTER — Other Ambulatory Visit: Payer: Self-pay

## 2023-04-08 ENCOUNTER — Emergency Department (HOSPITAL_COMMUNITY)
Admission: EM | Admit: 2023-04-08 | Discharge: 2023-04-09 | Disposition: A | Discharge status: Home / Self Care | Payer: Self-pay | Attending: Emergency Medicine | Admitting: Emergency Medicine

## 2023-04-08 DIAGNOSIS — Z760 Encounter for issue of repeat prescription: Secondary | ICD-10-CM | POA: Insufficient documentation

## 2023-04-08 DIAGNOSIS — R440 Auditory hallucinations: Secondary | ICD-10-CM | POA: Insufficient documentation

## 2023-04-08 NOTE — ED Triage Notes (Signed)
Pt arrives stating that he needs his dose of risperidone because he does not have access to his medication currently. Denies hallucinations, SI/HI. Calm and cooperative.

## 2023-04-08 NOTE — Discharge Instructions (Signed)
Please follow-up with [facility] behavioral health for further evaluation  Come back to the emergency department if you develop chest pain, shortness of breath, severe abdominal pain, uncontrolled nausea, vomiting, diarrhea.

## 2023-04-09 MED ORDER — RISPERIDONE 0.5 MG PO TABS
1.0000 mg | ORAL_TABLET | Freq: Once | ORAL | Status: AC
  Administered 2023-04-09: 1 mg via ORAL
  Filled 2023-04-09: qty 2

## 2023-04-09 NOTE — Discharge Instructions (Signed)
Please follow-up with [facility] outpatient behavioral health for further management of your medications  Come back to the emergency department if you develop chest pain, shortness of breath, severe abdominal pain, uncontrolled nausea, vomiting, diarrhea.

## 2023-04-09 NOTE — ED Provider Notes (Signed)
[place] EMERGENCY DEPARTMENT AT [facility] Provider Note   CSN: [id number] Arrival date & time: 04/08/23  2319     History  Chief Complaint  Patient presents with   Medication Refill    Cole Barrett is a 37 y.o. male.  HPI    Medical history including PE, alcohol dependency, schizophrenia, housing insecurity presenting with complaints of needing his medication.  Patient states that he uses Risperdal as he started to hear some voices in his head, voices are  not telling to hurt himself or others.  He states that he just needs medication.  He has no other complaints.   Reviewed patient's chart has been seen here multiple times for similar presentation most recently seen yesterday for similar complaint was given risperidone and was later discharged home.  Home Medications Prior to Admission medications   Medication Sig Start Date End Date Taking? Authorizing Provider  acetaminophen (TYLENOL 8 HOUR) 650 MG CR tablet Take 1 tablet (650 mg total) by mouth every 8 (eight) hours as needed for pain. 02/14/23   Mannie Stabile, PA-C  ibuprofen (ADVIL) 600 MG tablet Take 1 tablet (600 mg total) by mouth every 6 (six) hours as needed. Patient taking differently: Take 600 mg by mouth every 6 (six) hours as needed for mild pain. 12/24/22   Harris, Cammy Copa, PA-C  naproxen (NAPROSYN) 375 MG tablet Take 1 tablet (375 mg total) by mouth 2 (two) times daily. 02/14/23   Kommor, Madison, MD  risperiDONE (RISPERDAL M-TABS) 1 MG disintegrating tablet Take 1 tablet (1 mg total) by mouth 2 (two) times daily for 10 days. 12/24/22 03/02/23  Arthor Captain, PA-C  amantadine (SYMMETREL) 100 MG capsule Take 1 capsule (100 mg total) by mouth 2 (two) times daily. Patient not taking: Reported on 11/04/2019 09/01/19 11/17/19  [name], NP      Allergies    Patient has no known allergies.    Review of Systems   Review of Systems  Constitutional:  Negative for chills and fever.   Respiratory:  Negative for shortness of breath.   Cardiovascular:  Negative for chest pain.  Gastrointestinal:  Negative for abdominal pain.  Neurological:  Negative for headaches.  Psychiatric/Behavioral:  Positive for hallucinations.     Physical Exam Updated Vital Signs BP (!) 147/71 (BP Location: Left Arm) Comment: Simultaneous filing. User may not have seen previous data. Comment (BP Location): Simultaneous filing. User may not have seen previous data.  Pulse 74 Comment: Simultaneous filing. User may not have seen previous data.  Temp 98.3 F (36.8 C) (Oral) Comment: Simultaneous filing. User may not have seen previous data. Comment (Src): Simultaneous filing. User may not have seen previous data.  Resp 16 Comment: Simultaneous filing. User may not have seen previous data.  Ht 5\' 10"  (1.778 m)   Wt 99.8 kg   SpO2 95% Comment: Simultaneous filing. User may not have seen previous data.  BMI 31.57 kg/m  Physical Exam Vitals and nursing note reviewed.  Constitutional:      General: He is not in acute distress.    Appearance: He is not ill-appearing.  HENT:     Head: Normocephalic and atraumatic.     Nose: No congestion.  Eyes:     Conjunctiva/sclera: Conjunctivae normal.  Cardiovascular:     Rate and Rhythm: Normal rate and regular rhythm.     Pulses: Normal pulses.     Heart sounds: No murmur heard.    No friction rub. No gallop.  Pulmonary:     Effort: No respiratory distress.     Breath sounds: No wheezing, rhonchi or rales.  Skin:    General: Skin is warm and dry.  Neurological:     Mental Status: He is alert.  Psychiatric:        Mood and Affect: Mood normal.     Comments: Denies suicidal or homicidal ideations, does not appear to be responding to internal stimuli, responding appropriate to all questions make good eye contact has good insight.     ED Results / Procedures / Treatments   Labs (all labs ordered are listed, but only abnormal results are  displayed) Labs Reviewed - No data to display  EKG None  Radiology No results found.  Procedures Procedures    Medications Ordered in ED Medications  risperiDONE (RISPERDAL) tablet 1 mg (1 mg Oral Given 04/09/23 0109)    ED Course/ Medical Decision Making/ A&P                             Medical Decision Making Risk Prescription drug management.    This patient presents to the ED for concern of hallucination, this involves an extensive number of treatment options, and is a complaint that carries with it a high risk of complications and morbidity.  The differential diagnosis includes psychiatric emergency, metabolic derangements, withdrawals       Additional history obtained:   Additional history obtained from n/a External records from outside source obtained and reviewed including recent ED visits     Co morbidities that complicate the patient evaluation   Schizophrenia,   Social Determinants of Health:   Housing insecurity       Lab Tests:   I Ordered, and personally interpreted labs.  The pertinent results include: N/A     Imaging Studies ordered:   I ordered imaging studies including N/A I independently visualized and interpreted imaging which showed N/A I agree with the radiologist interpretation     Cardiac Monitoring:   The patient was maintained on a cardiac monitor.  I personally viewed and interpreted the cardiac monitored which showed an underlying rhythm of: n/a     Medicines ordered and prescription drug management:   I ordered medication including Risperdal I have reviewed the patients home medicines and have made adjustments as needed   Critical Interventions:   N/A     Reevaluation:   Presents with need of Risperdal as well as hallucinations will provide with his home medications and reassess   Reassessed asleep, resting comfortably, states he is feeling much better and agreeable discharge at this time   Consultations  Obtained:   N/a       Test Considered:   N/a       Rule out I doubt patient needs to be admitted for acute psychiatric emergency as he appears to be at his baseline not endorsing suicidal homicidal ideations, has good insight.  Doubt withdrawals nontremulous my exam vital signs reassuring       Dispostion and problem list   After consideration of the diagnostic results and the patients response to treatment, I feel that the patent would benefit from discharge.   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

## 2023-04-15 ENCOUNTER — Encounter (HOSPITAL_COMMUNITY): Payer: Self-pay

## 2023-04-15 ENCOUNTER — Emergency Department (HOSPITAL_COMMUNITY)
Admission: EM | Admit: 2023-04-15 | Discharge: 2023-04-15 | Disposition: A | Discharge status: Home / Self Care | Payer: Self-pay | Attending: Emergency Medicine | Admitting: Emergency Medicine

## 2023-04-15 ENCOUNTER — Other Ambulatory Visit: Payer: Self-pay

## 2023-04-15 DIAGNOSIS — Z8616 Personal history of COVID-19: Secondary | ICD-10-CM | POA: Insufficient documentation

## 2023-04-15 DIAGNOSIS — F209 Schizophrenia, unspecified: Secondary | ICD-10-CM | POA: Insufficient documentation

## 2023-04-15 DIAGNOSIS — Z760 Encounter for issue of repeat prescription: Secondary | ICD-10-CM | POA: Insufficient documentation

## 2023-04-15 DIAGNOSIS — F17210 Nicotine dependence, cigarettes, uncomplicated: Secondary | ICD-10-CM | POA: Insufficient documentation

## 2023-04-15 MED ORDER — RISPERIDONE 1 MG PO TBDP: 1.0000 mg | ORAL_TABLET | Freq: Two times a day (BID) | ORAL | 0 refills | Status: DC

## 2023-04-15 MED ORDER — RISPERIDONE 0.5 MG PO TABS
1.0000 mg | ORAL_TABLET | Freq: Once | ORAL | Status: AC
  Administered 2023-04-15: 1 mg via ORAL
  Filled 2023-04-15: qty 2

## 2023-04-15 NOTE — ED Triage Notes (Signed)
Pt. States that he is here to get a dose of his risperidone

## 2023-04-15 NOTE — Discharge Instructions (Signed)
You were evaluated in the Emergency Department and after careful evaluation, we did not find any emergent condition requiring admission or further testing in the hospital.  Your exam/testing today was overall reassuring.  Please return to the Emergency Department if you experience any worsening of your condition.  Thank you for allowing us to be a part of your care.  

## 2023-04-15 NOTE — ED Provider Notes (Signed)
[facility]-EMERGENCY DEPT [facility] Emergency Department Provider Note MRN:  [id number]  Arrival date & time: 04/15/23     Chief Complaint   Medication Refill   History of Present Illness   Cole Barrett is a 37 y.o. year-old male with a history of schizophrenia presenting to the ED with chief complaint of medication refill.  Patient says he is here for his risperidone prescription.  No other complaints.  Review of Systems  A thorough review of systems was obtained and all systems are negative except as noted in the HPI and PMH.   Patient's Health History    Past Medical History:  Diagnosis Date   COVID-19    Hallucination    Homelessness    Hypercholesterolemia    Malingering    Mental disorder    Paranoid behavior (HCC)    PE (pulmonary embolism)    Schizophrenia (HCC)     History reviewed. No pertinent surgical history.  Family History  Problem Relation Age of Onset   Hypertension Mother     Social History   Socioeconomic History   Marital status: Single    Spouse name: Not on file   Number of children: Not on file   Years of education: Not on file   Highest education level: Not on file  Occupational History   Occupation: Employed    Comment: [employer]  Tobacco Use   Smoking status: Every Day    Packs/day: .5    Types: Cigarettes   Smokeless tobacco: Never  Vaping Use   Vaping Use: Never used  Substance and Sexual Activity   Alcohol use: Yes    Comment: occ   Drug use: Not Currently    Types: Marijuana    Comment: Last used: 2 months ago UDS NA   Sexual activity: Never  Other Topics Concern   Not on file  Social History Narrative   ** Merged History Encounter **    Pt lives alone in [place].  He is employed.  He stated that he is followed by [facility], but that he has not been there in a while.   Social Determinants of Health   Financial Resource Strain: Not on file  Food Insecurity: Not on file  Transportation Needs: Not on  file  Physical Activity: Not on file  Stress: Not on file  Social Connections: Not on file  Intimate Partner Violence: Not on file     Physical Exam   Vitals:   04/15/23 0137  BP: (!) 147/88  Pulse: 84  Resp: 18  Temp: 97.9 F (36.6 C)  SpO2: 98%    CONSTITUTIONAL: Well-appearing, NAD NEURO/PSYCH:  Alert and oriented x 3, no focal deficits EYES:  eyes equal and reactive ENT/NECK:  no LAD, no JVD CARDIO: Regular rate, well-perfused, normal S1 and S2 PULM:  CTAB no wheezing or rhonchi GI/GU:  non-distended, non-tender MSK/SPINE:  No gross deformities, no edema SKIN:  no rash, atraumatic   *Additional and/or pertinent findings included in MDM below  Diagnostic and Interventional Summary    EKG Interpretation  Date/Time:    Ventricular Rate:    PR Interval:    QRS Duration:   QT Interval:    QTC Calculation:   R Axis:     Text Interpretation:         Labs Reviewed - No data to display  No orders to display    Medications  risperiDONE (RISPERDAL) tablet 1 mg (1 mg Oral Given 04/15/23 0135)  Procedures  /  Critical Care Procedures  ED Course and Medical Decision Making  Initial Impression and Ddx Repeated ED visits for the same, requesting his risperidone.  Vital signs normal, no other complaints, generally well-appearing.  Past medical/surgical history that increases complexity of ED encounter: Schizophrenia  Interpretation of Diagnostics Laboratory and/or imaging options to aid in the diagnosis/care of the patient were considered.  After careful history and physical examination, it was determined that there was no indication for diagnostics at this time.  Patient Reassessment and Ultimate Disposition/Management     Discharge  Patient management required discussion with the following services or consulting groups:  None  Complexity of Problems Addressed Acute complicated illness or Injury  Additional Data Reviewed and Analyzed Further history  obtained from: Prior ED visit notes  Additional Factors Impacting ED Encounter Risk Prescriptions  Elmer Sow. Pilar Plate, MD [facility] Emergency Medicine [facility] [email or web address]  Final Clinical Impressions(s) / ED Diagnoses     ICD-10-CM   1. Medication refill  Z76.0       ED Discharge Orders          Ordered    risperiDONE (RISPERDAL M-TABS) 1 MG disintegrating tablet  2 times daily        04/15/23 0129             Discharge Instructions Discussed with and Provided to Patient:    Discharge Instructions      You were evaluated in the Emergency Department and after careful evaluation, we did not find any emergent condition requiring admission or further testing in the hospital.  Your exam/testing today was overall reassuring.  Please return to the Emergency Department if you experience any worsening of your condition.  Thank you for allowing [place] to be a part of your care.       Sabas Sous, MD 04/15/23 [phone]

## 2023-04-16 ENCOUNTER — Other Ambulatory Visit: Payer: Self-pay

## 2023-04-16 ENCOUNTER — Emergency Department (HOSPITAL_COMMUNITY)
Admission: EM | Admit: 2023-04-16 | Discharge: 2023-04-16 | Disposition: A | Discharge status: Home / Self Care | Payer: Self-pay | Attending: Emergency Medicine | Admitting: Emergency Medicine

## 2023-04-16 DIAGNOSIS — Z760 Encounter for issue of repeat prescription: Secondary | ICD-10-CM | POA: Insufficient documentation

## 2023-04-16 DIAGNOSIS — Z79899 Other long term (current) drug therapy: Secondary | ICD-10-CM

## 2023-04-16 MED ORDER — RISPERIDONE 0.5 MG PO TABS
1.0000 mg | ORAL_TABLET | Freq: Once | ORAL | Status: AC
  Administered 2023-04-16: 1 mg via ORAL
  Filled 2023-04-16: qty 2

## 2023-04-16 NOTE — ED Provider Notes (Signed)
[place] EMERGENCY DEPARTMENT AT [facility] Provider Note   CSN: [id number] Arrival date & time: 04/16/23  0043     History  Chief Complaint  Patient presents with   Medication Refill    Cole Barrett is a 37 y.o. male.  The history is provided by the patient and medical records.  Medication Refill  37 year old male requesting dose of his nighttime Risperdal.  He is well-known to [place], seen nightly for the same.  Also requesting pair of dry socks as his feet got wet in the rain today.  He has no additional complaints.  Home Medications Prior to Admission medications   Medication Sig Start Date End Date Taking? Authorizing Provider  acetaminophen (TYLENOL 8 HOUR) 650 MG CR tablet Take 1 tablet (650 mg total) by mouth every 8 (eight) hours as needed for pain. 02/14/23   Mannie Stabile, PA-C  ibuprofen (ADVIL) 600 MG tablet Take 1 tablet (600 mg total) by mouth every 6 (six) hours as needed. Patient taking differently: Take 600 mg by mouth every 6 (six) hours as needed for mild pain. 12/24/22   Harris, Cammy Copa, PA-C  naproxen (NAPROSYN) 375 MG tablet Take 1 tablet (375 mg total) by mouth 2 (two) times daily. 02/14/23   Kommor, Madison, MD  risperiDONE (RISPERDAL M-TABS) 1 MG disintegrating tablet Take 1 tablet (1 mg total) by mouth 2 (two) times daily for 10 days. 04/15/23 04/25/23  Sabas Sous, MD  amantadine (SYMMETREL) 100 MG capsule Take 1 capsule (100 mg total) by mouth 2 (two) times daily. Patient not taking: Reported on 11/04/2019 09/01/19 11/17/19  [name], NP      Allergies    Patient has no known allergies.    Review of Systems   Review of Systems  Constitutional:        Medication needs  All other systems reviewed and are negative.   Physical Exam Updated Vital Signs BP (!) 140/121 (BP Location: Right Arm)   Pulse 96   Temp 98.6 F (37 C) (Oral)   Resp 18   Ht 5\' 10"  (1.778 m)   Wt 99.8 kg   SpO2 96%   BMI 31.57 kg/m    Physical Exam Vitals and nursing note reviewed.  Constitutional:      Appearance: He is well-developed.     Comments: Talkative in triage, appears at baseline  HENT:     Head: Normocephalic and atraumatic.  Eyes:     Conjunctiva/sclera: Conjunctivae normal.     Pupils: Pupils are equal, round, and reactive to light.  Cardiovascular:     Rate and Rhythm: Normal rate and regular rhythm.     Heart sounds: Normal heart sounds.  Pulmonary:     Effort: Pulmonary effort is normal.     Breath sounds: Normal breath sounds.  Abdominal:     General: Bowel sounds are normal.     Palpations: Abdomen is soft.  Musculoskeletal:        General: Normal range of motion.     Cervical back: Normal range of motion.  Skin:    General: Skin is warm and dry.  Neurological:     Mental Status: He is alert and oriented to person, place, and time.     ED Results / Procedures / Treatments   Labs (all labs ordered are listed, but only abnormal results are displayed) Labs Reviewed - No data to display  EKG None  Radiology No results found.  Procedures Procedures  Medications Ordered in ED Medications  risperiDONE (RISPERDAL) tablet 1 mg (1 mg Oral Given 04/16/23 0110)    ED Course/ Medical Decision Making/ A&P                             Medical Decision Making Risk Prescription drug management.   37 year old male here requesting his nighttime Risperdal and pair of dry socks.  Reports he got wet in the rain today.  He is well-known to our facility, appears at his baseline.  He was given medication and socks as requested, stable for discharge.  Final Clinical Impression(s) / ED Diagnoses Final diagnoses:  Medication management    Rx / DC Orders ED Discharge Orders     None         Garlon Hatchet, PA-C 04/16/23 0152    Tilden Fossa, MD 04/16/23 [phone]

## 2023-04-16 NOTE — ED Triage Notes (Signed)
Patient coming to ED for evaluation and needing medication refill.  Reports he needs a dose of his risperidone.

## 2023-04-22 ENCOUNTER — Emergency Department (HOSPITAL_COMMUNITY)
Admission: EM | Admit: 2023-04-22 | Discharge: 2023-04-23 | Disposition: A | Discharge status: Home / Self Care | Payer: Self-pay | Attending: Emergency Medicine | Admitting: Emergency Medicine

## 2023-04-22 ENCOUNTER — Encounter (HOSPITAL_COMMUNITY): Payer: Self-pay

## 2023-04-22 ENCOUNTER — Other Ambulatory Visit: Payer: Self-pay

## 2023-04-22 ENCOUNTER — Emergency Department (HOSPITAL_COMMUNITY)
Admission: EM | Admit: 2023-04-22 | Discharge: 2023-04-22 | Disposition: A | Discharge status: Home / Self Care | Payer: Self-pay | Attending: Emergency Medicine | Admitting: Emergency Medicine

## 2023-04-22 DIAGNOSIS — F209 Schizophrenia, unspecified: Secondary | ICD-10-CM | POA: Insufficient documentation

## 2023-04-22 DIAGNOSIS — Z760 Encounter for issue of repeat prescription: Secondary | ICD-10-CM | POA: Insufficient documentation

## 2023-04-22 DIAGNOSIS — Z5900 Homelessness unspecified: Secondary | ICD-10-CM | POA: Insufficient documentation

## 2023-04-22 MED ORDER — RISPERIDONE 1 MG PO TABS
1.0000 mg | ORAL_TABLET | Freq: Once | ORAL | Status: AC
  Administered 2023-04-22: 1 mg via ORAL
  Filled 2023-04-22: qty 1

## 2023-04-22 MED ORDER — RISPERIDONE 0.5 MG PO TBDP
1.0000 mg | ORAL_TABLET | Freq: Once | ORAL | Status: AC
  Administered 2023-04-23: 1 mg via ORAL
  Filled 2023-04-22: qty 2

## 2023-04-22 NOTE — ED Provider Notes (Signed)
[facility] EMERGENCY DEPARTMENT AT [facility] Provider Note   CSN: [id number] Arrival date & time: 04/22/23  0440     History  Chief Complaint  Patient presents with   Medication Request    Cole Barrett is a 37 y.o. male.  HPI Patient is a 37 year old male well-known to this department requesting his risperidone.  He has no other complaints    Home Medications Prior to Admission medications   Medication Sig Start Date End Date Taking? Authorizing Provider  acetaminophen (TYLENOL 8 HOUR) 650 MG CR tablet Take 1 tablet (650 mg total) by mouth every 8 (eight) hours as needed for pain. 02/14/23   Mannie Stabile, PA-C  ibuprofen (ADVIL) 600 MG tablet Take 1 tablet (600 mg total) by mouth every 6 (six) hours as needed. Patient taking differently: Take 600 mg by mouth every 6 (six) hours as needed for mild pain. 12/24/22   Harris, Cammy Copa, PA-C  naproxen (NAPROSYN) 375 MG tablet Take 1 tablet (375 mg total) by mouth 2 (two) times daily. 02/14/23   Kommor, Madison, MD  risperiDONE (RISPERDAL M-TABS) 1 MG disintegrating tablet Take 1 tablet (1 mg total) by mouth 2 (two) times daily for 10 days. 04/15/23 04/25/23  Sabas Sous, MD  amantadine (SYMMETREL) 100 MG capsule Take 1 capsule (100 mg total) by mouth 2 (two) times daily. Patient not taking: Reported on 11/04/2019 09/01/19 11/17/19  [name], NP      Allergies    Patient has no known allergies.    Review of Systems   Review of Systems  Physical Exam Updated Vital Signs BP 136/87   Pulse 75   Temp 97.8 F (36.6 C)   Resp 16   SpO2 97%  Physical Exam Vitals and nursing note reviewed.  Constitutional:      General: He is not in acute distress.    Appearance: Normal appearance. He is not ill-appearing.  HENT:     Head: Normocephalic and atraumatic.  Eyes:     General: No scleral icterus.       Right eye: No discharge.        Left eye: No discharge.     Conjunctiva/sclera: Conjunctivae  normal.  Pulmonary:     Effort: Pulmonary effort is normal.     Breath sounds: No stridor.  Neurological:     Mental Status: He is alert and oriented to person, place, and time. Mental status is at baseline.     ED Results / Procedures / Treatments   Labs (all labs ordered are listed, but only abnormal results are displayed) Labs Reviewed - No data to display  EKG None  Radiology No results found.  Procedures Procedures    Medications Ordered in ED Medications  risperiDONE (RISPERDAL) tablet 1 mg (has no administration in time range)    ED Course/ Medical Decision Making/ A&P                             Medical Decision Making Risk Prescription drug management.   Patient here chronically for his Risperdal.  He is in no acute distress denies any SI HI or AVH.  Well-appearing normal vital signs will discharge home.   Final Clinical Impression(s) / ED Diagnoses Final diagnoses:  Homeless    Rx / DC Orders ED Discharge Orders     None         Solon Augusta Clifford, [place] 04/22/23 [phone]  [name], MD 04/22/23 [phone]

## 2023-04-22 NOTE — ED Triage Notes (Signed)
Pt. Arrives requesting his risperidone.

## 2023-04-22 NOTE — ED Triage Notes (Signed)
Pt here requesting his risperidone.

## 2023-04-22 NOTE — ED Notes (Signed)
Pt discharged from triage at this time.

## 2023-04-22 NOTE — Discharge Instructions (Addendum)
Evaluation today was overall reassuring.  Recommend you follow-up with your PCP for further refills of your Risperdal.

## 2023-04-22 NOTE — ED Provider Notes (Signed)
   EMERGENCY DEPARTMENT AT [facility] Provider Note   CSN: [id number] Arrival date & time: 04/22/23  2223     History  No chief complaint on file.  HPI Cole Barrett is a 38 y.o. male with schizophrenia presenting for medication refill.  Patient is well-known to department.  Requesting refill his risperidone.  No other medical complaints.  HPI     Home Medications Prior to Admission medications   Medication Sig Start Date End Date Taking? Authorizing Provider  acetaminophen (TYLENOL 8 HOUR) 650 MG CR tablet Take 1 tablet (650 mg total) by mouth every 8 (eight) hours as needed for pain. 02/14/23   Mannie Stabile, PA-C  ibuprofen (ADVIL) 600 MG tablet Take 1 tablet (600 mg total) by mouth every 6 (six) hours as needed. Patient taking differently: Take 600 mg by mouth every 6 (six) hours as needed for mild pain. 12/24/22   Harris, Cammy Copa, PA-C  naproxen (NAPROSYN) 375 MG tablet Take 1 tablet (375 mg total) by mouth 2 (two) times daily. 02/14/23   Kommor, Madison, MD  risperiDONE (RISPERDAL M-TABS) 1 MG disintegrating tablet Take 1 tablet (1 mg total) by mouth 2 (two) times daily for 10 days. 04/15/23 04/25/23  Sabas Sous, MD  amantadine (SYMMETREL) 100 MG capsule Take 1 capsule (100 mg total) by mouth 2 (two) times daily. Patient not taking: Reported on 11/04/2019 09/01/19 11/17/19  [name], NP      Allergies    Patient has no known allergies.    Review of Systems   See HPI for pertinent positives.  Physical Exam Updated Vital Signs BP 127/72 (BP Location: Left Arm)   Pulse 84   Temp 98.7 F (37.1 C) (Oral)   Resp 17   SpO2 100%  Physical Exam Constitutional:      Appearance: Normal appearance.  HENT:     Head: Normocephalic.     Nose: Nose normal.  Eyes:     Conjunctiva/sclera: Conjunctivae normal.  Pulmonary:     Effort: Pulmonary effort is normal.  Neurological:     Mental Status: He is alert.  Psychiatric:         Mood and Affect: Mood normal.     ED Results / Procedures / Treatments   Labs (all labs ordered are listed, but only abnormal results are displayed) Labs Reviewed - No data to display  EKG None  Radiology No results found.  Procedures Procedures    Medications Ordered in ED Medications  risperiDONE (RISPERDAL M-TABS) disintegrating tablet 1 mg (has no administration in time range)    ED Course/ Medical Decision Making/ A&P                             Medical Decision Making  37 year old well-appearing male presenting for med refill.  Exam is unremarkable.  Treated him with his home dose of risperidone.  Advised to follow-up with his PCP.  Vital stable at discharge.        Final Clinical Impression(s) / ED Diagnoses Final diagnoses:  Medication refill    Rx / DC Orders ED Discharge Orders     None         Gareth Eagle, PA-C 04/22/23 2313    Loetta Rough, MD 04/23/23 2101

## 2023-04-23 ENCOUNTER — Emergency Department (HOSPITAL_COMMUNITY)
Admission: EM | Admit: 2023-04-23 | Discharge: 2023-04-23 | Disposition: A | Discharge status: Home / Self Care | Payer: Self-pay | Attending: Emergency Medicine | Admitting: Emergency Medicine

## 2023-04-23 DIAGNOSIS — Z760 Encounter for issue of repeat prescription: Secondary | ICD-10-CM | POA: Insufficient documentation

## 2023-04-23 MED ORDER — RISPERIDONE 0.5 MG PO TABS
1.0000 mg | ORAL_TABLET | Freq: Once | ORAL | Status: AC
  Administered 2023-04-23: 1 mg via ORAL
  Filled 2023-04-23: qty 2

## 2023-04-23 NOTE — ED Notes (Signed)
Patient given discharge instructions and follow up care. Verbalized understanding. Ambulatory out of ED by self.

## 2023-04-23 NOTE — ED Triage Notes (Signed)
Pt arrives wanting dose of risperidone. Denies SI/HI. A&O, calm and cooperative.

## 2023-04-23 NOTE — ED Provider Notes (Signed)
[place] EMERGENCY DEPARTMENT AT [facility] Provider Note   CSN: [id number] Arrival date & time: 04/23/23  2003     History  No chief complaint on file.   Cole Barrett is a 37 y.o. male with schizophrenia presenting for medication refill of risperidone.  Patient is well-known to this department.  Requesting refill his risperidone.  No other medical complaints.  HPI     Home Medications Prior to Admission medications   Medication Sig Start Date End Date Taking? Authorizing Provider  acetaminophen (TYLENOL 8 HOUR) 650 MG CR tablet Take 1 tablet (650 mg total) by mouth every 8 (eight) hours as needed for pain. 02/14/23   Mannie Stabile, PA-C  ibuprofen (ADVIL) 600 MG tablet Take 1 tablet (600 mg total) by mouth every 6 (six) hours as needed. Patient taking differently: Take 600 mg by mouth every 6 (six) hours as needed for mild pain. 12/24/22   Harris, Cammy Copa, PA-C  naproxen (NAPROSYN) 375 MG tablet Take 1 tablet (375 mg total) by mouth 2 (two) times daily. 02/14/23   Kommor, Madison, MD  risperiDONE (RISPERDAL M-TABS) 1 MG disintegrating tablet Take 1 tablet (1 mg total) by mouth 2 (two) times daily for 10 days. 04/15/23 04/25/23  Sabas Sous, MD  amantadine (SYMMETREL) 100 MG capsule Take 1 capsule (100 mg total) by mouth 2 (two) times daily. Patient not taking: Reported on 11/04/2019 09/01/19 11/17/19  [name], NP      Allergies    Patient has no known allergies.    Review of Systems   Review of Systems  All other systems reviewed and are negative.   Physical Exam Updated Vital Signs There were no vitals taken for this visit. Physical Exam Vitals and nursing note reviewed.  Constitutional:      General: He is not in acute distress.    Appearance: Normal appearance.  HENT:     Head: Normocephalic and atraumatic.  Eyes:     General:        Right eye: No discharge.        Left eye: No discharge.  Cardiovascular:     Rate and  Rhythm: Normal rate and regular rhythm.  Pulmonary:     Effort: Pulmonary effort is normal. No respiratory distress.  Musculoskeletal:        General: No deformity.  Skin:    General: Skin is warm and dry.  Neurological:     Mental Status: He is alert and oriented to person, place, and time.  Psychiatric:        Mood and Affect: Mood normal.        Behavior: Behavior normal.     ED Results / Procedures / Treatments   Labs (all labs ordered are listed, but only abnormal results are displayed) Labs Reviewed - No data to display  EKG None  Radiology No results found.  Procedures Procedures    Medications Ordered in ED Medications  risperiDONE (RISPERDAL) tablet 1 mg (has no administration in time range)    ED Course/ Medical Decision Making/ A&P                             Medical Decision Making  This is a well-known to this department 37 year old male who presents with concern for his home Risperdal medication.  Patient denies any medical complaints at this time.  He is in no acute distress.  Vital signs are stable,  he is nonseptic, nontoxic appearing.  Administered his home Risperdal, encouraged him to pick up his home medication from the pharmacy and discharged at this time in stable condition. Final Clinical Impression(s) / ED Diagnoses Final diagnoses:  None    Rx / DC Orders ED Discharge Orders     None         [place] 04/23/23 2056    Rolan Bucco, MD 04/23/23 2230

## 2023-04-25 ENCOUNTER — Encounter (HOSPITAL_COMMUNITY): Payer: Self-pay

## 2023-04-25 ENCOUNTER — Emergency Department (HOSPITAL_COMMUNITY)
Admission: EM | Admit: 2023-04-25 | Discharge: 2023-04-25 | Disposition: A | Discharge status: Home / Self Care | Payer: Self-pay | Attending: Emergency Medicine | Admitting: Emergency Medicine

## 2023-04-25 ENCOUNTER — Other Ambulatory Visit: Payer: Self-pay

## 2023-04-25 DIAGNOSIS — Z760 Encounter for issue of repeat prescription: Secondary | ICD-10-CM | POA: Insufficient documentation

## 2023-04-25 DIAGNOSIS — Z79899 Other long term (current) drug therapy: Secondary | ICD-10-CM

## 2023-04-25 MED ORDER — RISPERIDONE 0.5 MG PO TABS
1.0000 mg | ORAL_TABLET | Freq: Once | ORAL | Status: AC
  Administered 2023-04-25: 1 mg via ORAL
  Filled 2023-04-25: qty 2

## 2023-04-25 NOTE — ED Provider Notes (Signed)
[place] EMERGENCY DEPARTMENT AT [facility] Provider Note   CSN: [id number] Arrival date & time: 04/25/23  0410     History  Chief Complaint  Patient presents with   Medication Refill    Cole Barrett is a 37 y.o. male.  HPI Patient is a 37 year old male well-known to this department requesting his risperidone.  He has no other complaints      Home Medications Prior to Admission medications   Medication Sig Start Date End Date Taking? Authorizing Provider  acetaminophen (TYLENOL 8 HOUR) 650 MG CR tablet Take 1 tablet (650 mg total) by mouth every 8 (eight) hours as needed for pain. 02/14/23   Mannie Stabile, PA-C  ibuprofen (ADVIL) 600 MG tablet Take 1 tablet (600 mg total) by mouth every 6 (six) hours as needed. Patient taking differently: Take 600 mg by mouth every 6 (six) hours as needed for mild pain. 12/24/22   Harris, Cammy Copa, PA-C  naproxen (NAPROSYN) 375 MG tablet Take 1 tablet (375 mg total) by mouth 2 (two) times daily. 02/14/23   Kommor, Madison, MD  risperiDONE (RISPERDAL M-TABS) 1 MG disintegrating tablet Take 1 tablet (1 mg total) by mouth 2 (two) times daily for 10 days. 04/15/23 04/25/23  Sabas Sous, MD  amantadine (SYMMETREL) 100 MG capsule Take 1 capsule (100 mg total) by mouth 2 (two) times daily. Patient not taking: Reported on 11/04/2019 09/01/19 11/17/19  [name], NP      Allergies    Patient has no known allergies.    Review of Systems   Review of Systems  Physical Exam Updated Vital Signs There were no vitals taken for this visit. Physical Exam Vitals and nursing note reviewed.  Constitutional:      General: He is not in acute distress.    Appearance: Normal appearance. He is not ill-appearing.     Comments: Pleasant well-appearing 37 year old.  In no acute distress.  Sitting comfortably in bed.  Able answer questions appropriately follow commands. No increased work of breathing. Speaking in full sentences.    HENT:     Head: Normocephalic and atraumatic.  Eyes:     General: No scleral icterus.       Right eye: No discharge.        Left eye: No discharge.     Conjunctiva/sclera: Conjunctivae normal.  Pulmonary:     Effort: Pulmonary effort is normal.     Breath sounds: No stridor.  Neurological:     Mental Status: He is alert and oriented to person, place, and time. Mental status is at baseline.     ED Results / Procedures / Treatments   Labs (all labs ordered are listed, but only abnormal results are displayed) Labs Reviewed - No data to display  EKG None  Radiology No results found.  Procedures Procedures    Medications Ordered in ED Medications  risperiDONE (RISPERDAL) tablet 1 mg (has no administration in time range)    ED Course/ Medical Decision Making/ A&P                             Medical Decision Making   This is a well-known to this department 37 year old male who presents with concern for his home Risperdal medication.  Patient denies any medical complaints at this time.  He is in no acute distress.  Vital signs are stable, he is nonseptic, nontoxic appearing.  Administered his home Risperdal, encouraged  him to pick up his home medication from the pharmacy and discharged at this time in stable condition.   Final Clinical Impression(s) / ED Diagnoses Final diagnoses:  Medication management    Rx / DC Orders ED Discharge Orders     None         Gailen Shelter, [place] 04/25/23 0419    Palumbo, April, MD 04/25/23 [phone]

## 2023-04-25 NOTE — ED Triage Notes (Signed)
Patient reports he needs a dose of his risperidone. Can't find it where he's staying at currently and wants dose before going to work.

## 2024-03-24 ENCOUNTER — Other Ambulatory Visit: Payer: Self-pay

## 2024-03-24 ENCOUNTER — Emergency Department (HOSPITAL_COMMUNITY)
Admission: EM | Admit: 2024-03-24 | Discharge: 2024-03-25 | Disposition: A | Discharge status: Home / Self Care | Payer: Self-pay | Attending: Emergency Medicine | Admitting: Emergency Medicine

## 2024-03-24 ENCOUNTER — Encounter (HOSPITAL_COMMUNITY): Payer: Self-pay

## 2024-03-24 DIAGNOSIS — Z760 Encounter for issue of repeat prescription: Secondary | ICD-10-CM

## 2024-03-24 DIAGNOSIS — Z7689 Persons encountering health services in other specified circumstances: Secondary | ICD-10-CM | POA: Insufficient documentation

## 2024-03-24 NOTE — ED Triage Notes (Signed)
 Complaining of needing his risperidol today. Has been a couple of years since his last dose.

## 2024-03-25 MED ORDER — RISPERIDONE 1 MG PO TBDP
1.0000 mg | ORAL_TABLET | Freq: Once | ORAL | Status: AC
  Administered 2024-03-25: 1 mg via ORAL
  Filled 2024-03-25: qty 1

## 2024-03-25 NOTE — ED Provider Notes (Signed)
 [place] EMERGENCY DEPARTMENT AT [facility] Provider Note   CSN: [id number] Arrival date & time: 03/24/24  2345     History  Chief Complaint  Patient presents with   Medication Refill    Cole Barrett is a 38 y.o. 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  Has not been seen in approximately 1 year.  Presents today without acute medical complaint but requesting dose of his Risperdal  tonight.  HPI     Home Medications Prior to Admission medications   Medication Sig Start Date End Date Taking? Authorizing Provider  acetaminophen  (TYLENOL  8 HOUR) 650 MG CR tablet Take 1 tablet (650 mg total) by mouth every 8 (eight) hours as needed for pain. 02/14/23   Aberman, Caroline C, PA-C  ibuprofen  (ADVIL ) 600 MG tablet Take 1 tablet (600 mg total) by mouth every 6 (six) hours as needed. Patient taking differently: Take 600 mg by mouth every 6 (six) hours as needed for mild pain. 12/24/22   Harris, Abigail, PA-C  naproxen  (NAPROSYN ) 375 MG tablet Take 1 tablet (375 mg total) by mouth 2 (two) times daily. 02/14/23   Kommor, Madison, MD  risperiDONE  (RISPERDAL  M-TABS) 1 MG disintegrating tablet Take 1 tablet (1 mg total) by mouth 2 (two) times daily for 10 days. 04/15/23 04/25/23  Edson Graces, MD  amantadine  (SYMMETREL ) 100 MG capsule Take 1 capsule (100 mg total) by mouth 2 (two) times daily. Patient not taking: Reported on 11/04/2019 09/01/19 11/17/19  Doneen Fuelling, NP      Allergies    Patient has no known allergies.    Review of Systems   Review of Systems  Physical Exam Updated Vital Signs BP 122/75 (BP Location: Right Arm)   Pulse 76   Temp 97.8 F (36.6 C)   Resp 18   Ht 5\' 10"  (1.778 m)   Wt 99.8 kg   SpO2 100%   BMI 31.57 kg/m  Physical Exam Vitals and nursing note reviewed.  Constitutional:      Appearance: He is obese. He is not ill-appearing or toxic-appearing.  HENT:      Head: Normocephalic and atraumatic.  Eyes:     General: No scleral icterus.       Right eye: No discharge.        Left eye: No discharge.     Conjunctiva/sclera: Conjunctivae normal.  Pulmonary:     Effort: Pulmonary effort is normal.  Skin:    General: Skin is warm and dry.     Capillary Refill: Capillary refill takes less than 2 seconds.  Neurological:     General: No focal deficit present.     Mental Status: He is alert.  Psychiatric:        Attention and Perception: Attention normal.        Mood and Affect: Mood normal.        Speech: Speech normal.        Behavior: Behavior normal.        Thought Content: Thought content normal.     Comments: Does not appear to be responding to internal stimuli.     ED Results / Procedures / Treatments   Labs (all labs ordered are listed, but only abnormal results are displayed) Labs Reviewed - No data to display  EKG None  Radiology No results found.  Procedures Procedures    Medications Ordered in ED Medications  risperiDONE  (RISPERDAL  M-TABS) disintegrating  tablet 1 mg (has no administration in time range)    ED Course/ Medical Decision Making/ A&P                                 Medical Decision Making 38 year old male with known schizophrenia presents with request for dose of his medication.  No acute medical complaint.  Calm cooperative and very pleasant in the ED.  Normal vitals on intake.  Cardiopulmonary unremarkable.  Well-appearing.  Risk Prescription drug management.   No acute medical concern.  Clinical concern for emergent underlying condition that would warrant further ED workup or inpatient management is exceedingly low. [name] voiced understanding of his medical evaluation and treatment plan. Each of their questions answered to their expressed satisfaction.  Return precautions were given.  Patient is well-appearing, stable, and was discharged in good condition.  This chart was dictated using  voice recognition software, Dragon. Despite the best efforts of this provider to proofread and correct errors, errors may still occur which can change documentation meaning.         Final Clinical Impression(s) / ED Diagnoses Final diagnoses:  None    Rx / DC Orders ED Discharge Orders     None         Arlyne Lame 03/25/24 0200    Eldon Greenland, MD 03/25/24 [phone]

## 2024-03-25 NOTE — ED Notes (Signed)
Pt given sandwich bag and sprite

## 2024-04-09 ENCOUNTER — Emergency Department (HOSPITAL_COMMUNITY): Admission: EM | Admit: 2024-04-09 | Discharge: 2024-04-09 | Discharge status: Against Medical Advice | Payer: Self-pay

## 2024-04-09 NOTE — ED Notes (Signed)
 Pt called a 2nd time with zero response

## 2024-04-09 NOTE — ED Notes (Signed)
Pt called to triage, no response.

## 2024-04-09 NOTE — ED Notes (Signed)
 Pt called a 3rd time, no response

## 2024-04-20 ENCOUNTER — Other Ambulatory Visit: Payer: Self-pay

## 2024-04-20 ENCOUNTER — Emergency Department (HOSPITAL_COMMUNITY)
Admission: EM | Admit: 2024-04-20 | Discharge: 2024-04-20 | Disposition: A | Discharge status: Home / Self Care | Payer: Self-pay | Attending: Emergency Medicine | Admitting: Emergency Medicine

## 2024-04-20 DIAGNOSIS — Z8619 Personal history of other infectious and parasitic diseases: Secondary | ICD-10-CM | POA: Insufficient documentation

## 2024-04-20 DIAGNOSIS — Z760 Encounter for issue of repeat prescription: Secondary | ICD-10-CM | POA: Insufficient documentation

## 2024-04-20 DIAGNOSIS — Z5900 Homelessness unspecified: Secondary | ICD-10-CM | POA: Insufficient documentation

## 2024-04-20 MED ORDER — RISPERIDONE 1 MG PO TABS
1.0000 mg | ORAL_TABLET | Freq: Once | ORAL | Status: AC
  Administered 2024-04-20: 1 mg via ORAL
  Filled 2024-04-20 (×2): qty 1

## 2024-04-20 NOTE — ED Provider Notes (Signed)
 [place] EMERGENCY DEPARTMENT AT [facility] Provider Note   CSN: [id number] Arrival date & time: 04/20/24  4098     History  Chief Complaint  Patient presents with   Medication Refill    Cole Barrett is a 38 y.o. male.  The history is provided by the patient.  Medication Refill Medications/supplies requested:  Risperdal  has not had in years Medications taken before: yes - see home medications   Patient has complete original prescription information: yes   Patient with schizophrenia well known to the ED would like a dose of risperdal .   ' Past Medical History:  Diagnosis Date   COVID-19    Hallucination    Homelessness    Hypercholesterolemia    Malingering    Mental disorder    Paranoid behavior (HCC)    PE (pulmonary embolism)    Schizophrenia (HCC)        Home Medications Prior to Admission medications   Medication Sig Start Date End Date Taking? Authorizing Provider  acetaminophen  (TYLENOL  8 HOUR) 650 MG CR tablet Take 1 tablet (650 mg total) by mouth every 8 (eight) hours as needed for pain. 02/14/23   Aberman, Caroline C, PA-C  ibuprofen  (ADVIL ) 600 MG tablet Take 1 tablet (600 mg total) by mouth every 6 (six) hours as needed. Patient taking differently: Take 600 mg by mouth every 6 (six) hours as needed for mild pain. 12/24/22   Harris, Abigail, PA-C  naproxen  (NAPROSYN ) 375 MG tablet Take 1 tablet (375 mg total) by mouth 2 (two) times daily. 02/14/23   Kommor, Madison, MD  risperiDONE  (RISPERDAL  M-TABS) 1 MG disintegrating tablet Take 1 tablet (1 mg total) by mouth 2 (two) times daily for 10 days. 04/15/23 04/25/23  Edson Graces, MD  amantadine  (SYMMETREL ) 100 MG capsule Take 1 capsule (100 mg total) by mouth 2 (two) times daily. Patient not taking: Reported on 11/04/2019 09/01/19 11/17/19  Doneen Fuelling, NP      Allergies    Patient has no known allergies.    Review of Systems   Review of Systems  Constitutional:  Negative for  fever.  Eyes:  Negative for redness.  Respiratory:  Negative for shortness of breath, wheezing and stridor.   All other systems reviewed and are negative.   Physical Exam Updated Vital Signs BP (!) 140/71 (BP Location: Right Arm)   Pulse 89   Temp 98.3 F (36.8 C) (Oral)   Resp 18   SpO2 98%  Physical Exam Vitals and nursing note reviewed.  Constitutional:      General: He is not in acute distress.    Appearance: He is well-developed. He is not diaphoretic.  HENT:     Head: Normocephalic and atraumatic.     Nose: Nose normal.  Eyes:     Conjunctiva/sclera: Conjunctivae normal.     Pupils: Pupils are equal, round, and reactive to light.  Cardiovascular:     Rate and Rhythm: Normal rate and regular rhythm.     Pulses: Normal pulses.     Heart sounds: Normal heart sounds.  Pulmonary:     Effort: Pulmonary effort is normal.     Breath sounds: Normal breath sounds. No wheezing or rales.  Abdominal:     General: Bowel sounds are normal.     Palpations: Abdomen is soft.     Tenderness: There is no abdominal tenderness. There is no guarding or rebound.  Musculoskeletal:        General: Normal range  of motion.     Cervical back: Normal range of motion and neck supple.  Skin:    General: Skin is warm and dry.     Capillary Refill: Capillary refill takes less than 2 seconds.  Neurological:     General: No focal deficit present.     Mental Status: He is alert.     Deep Tendon Reflexes: Reflexes normal.  Psychiatric:        Mood and Affect: Mood normal.        Behavior: Behavior normal.     ED Results / Procedures / Treatments   Labs (all labs ordered are listed, but only abnormal results are displayed) Labs Reviewed - No data to display  EKG None  Radiology No results found.  Procedures Procedures    Medications Ordered in ED Medications  risperiDONE  (RISPERDAL ) tablet 1 mg (has no administration in time range)    ED Course/ Medical Decision Making/ A&P                                  Medical Decision Making Patient here for Risperdal    Amount and/or Complexity of Data Reviewed External Data Reviewed: notes.    Details: Previous visits reviewed   Risk Prescription drug management. Risk Details: Medications ordered, stable for discharge.  Strict returns.      Final Clinical Impression(s) / ED Diagnoses Final diagnoses:  Medication refill    No signs of systemic illness or infection. The patient is nontoxic-appearing on exam and vital signs are within normal limits.  I have reviewed the triage vital signs and the nursing notes. Pertinent labs & imaging results that were available during my care of the patient were reviewed by me and considered in my medical decision making (see chart for details). After history, exam, and medical workup I feel the patient has been appropriately medically screened and is safe for discharge home. Pertinent diagnoses were discussed with the patient. Patient was given return precautions.  Rx / DC Orders ED Discharge Orders     None         Nyeli Holtmeyer, MD 04/20/24 6045

## 2024-04-20 NOTE — ED Triage Notes (Signed)
 Patient requesting Risperidal , he has not taken his medications regularly for several years . No SI or HI . Denies hallucinations .

## 2024-04-22 ENCOUNTER — Emergency Department (HOSPITAL_COMMUNITY)
Admission: EM | Admit: 2024-04-22 | Discharge: 2024-04-22 | Discharge status: Against Medical Advice | Payer: Self-pay | Attending: Emergency Medicine | Admitting: Emergency Medicine

## 2024-04-22 ENCOUNTER — Emergency Department (HOSPITAL_COMMUNITY)
Admission: EM | Admit: 2024-04-22 | Discharge: 2024-04-22 | Disposition: A | Discharge status: Home / Self Care | Payer: Self-pay | Attending: Emergency Medicine | Admitting: Emergency Medicine

## 2024-04-22 ENCOUNTER — Other Ambulatory Visit: Payer: Self-pay

## 2024-04-22 ENCOUNTER — Encounter (HOSPITAL_COMMUNITY): Payer: Self-pay

## 2024-04-22 DIAGNOSIS — Z760 Encounter for issue of repeat prescription: Secondary | ICD-10-CM | POA: Insufficient documentation

## 2024-04-22 DIAGNOSIS — Z5321 Procedure and treatment not carried out due to patient leaving prior to being seen by health care provider: Secondary | ICD-10-CM | POA: Insufficient documentation

## 2024-04-22 DIAGNOSIS — Z7689 Persons encountering health services in other specified circumstances: Secondary | ICD-10-CM | POA: Insufficient documentation

## 2024-04-22 DIAGNOSIS — Z79899 Other long term (current) drug therapy: Secondary | ICD-10-CM

## 2024-04-22 MED ORDER — RISPERIDONE 1 MG PO TABS
1.0000 mg | ORAL_TABLET | Freq: Every day | ORAL | Status: DC
  Administered 2024-04-22: 1 mg via ORAL
  Filled 2024-04-22 (×2): qty 1

## 2024-04-22 MED ORDER — RISPERIDONE 1 MG PO TABS
1.0000 mg | ORAL_TABLET | Freq: Once | ORAL | Status: AC
  Administered 2024-04-22: 1 mg via ORAL
  Filled 2024-04-22 (×2): qty 1

## 2024-04-22 NOTE — ED Triage Notes (Signed)
 Pt is here to get a dose of Risperidal. Does not take medication regularly. Denies HI or SI. Denies any hallucinations.

## 2024-04-22 NOTE — ED Notes (Signed)
 Pt came back and said he was waiting in the lobby.

## 2024-04-22 NOTE — ED Notes (Signed)
 Pt came back to hallway bed, asked for a sandwich bag and sprite but wanted to take it back out to the lobby, This Rn informed pt he needed to stay at his bed so the provider could come by to see him. He agreed but later when the provider came to see the pt he was gone

## 2024-04-22 NOTE — ED Triage Notes (Signed)
 Pt here for prescription of risperidal. Has been out for 3 days. Denies any hi/si. Denies hallucinations.

## 2024-04-22 NOTE — Discharge Instructions (Signed)
 Please follow up with you PCP for refills. If you have any other concerns, new or worsening symptoms, please return to the ER for re-evaluation.

## 2024-04-22 NOTE — ED Notes (Signed)
Dc instructions reviewed with pt no questions at this time.

## 2024-04-22 NOTE — ED Provider Notes (Signed)
 [place] EMERGENCY DEPARTMENT AT [facility] Provider Note   CSN: [id number] Arrival date & time: 04/22/24  0315     History  Chief Complaint  Patient presents with   Medication Refill    Cole Barrett is a 38 y.o. male.  The history is provided by the patient and medical records.  Medication Refill  38 year old male with history of adjustment disorder, alcohol abuse, chronic auditory hallucinations, schizophrenia, presenting to the ED requesting dose of his home Risperdal .  States he has a active prescription at the pharmacy but there close currently due to the holiday.  States he just needs a dose for tonight.  He denies any SI/HI/AVH currently.  No other complaints.  Home Medications Prior to Admission medications   Medication Sig Start Date End Date Taking? Authorizing Provider  acetaminophen  (TYLENOL  8 HOUR) 650 MG CR tablet Take 1 tablet (650 mg total) by mouth every 8 (eight) hours as needed for pain. 02/14/23   Aberman, Caroline C, PA-C  ibuprofen  (ADVIL ) 600 MG tablet Take 1 tablet (600 mg total) by mouth every 6 (six) hours as needed. Patient taking differently: Take 600 mg by mouth every 6 (six) hours as needed for mild pain. 12/24/22   Harris, Abigail, PA-C  naproxen  (NAPROSYN ) 375 MG tablet Take 1 tablet (375 mg total) by mouth 2 (two) times daily. 02/14/23   Kommor, Madison, MD  risperiDONE  (RISPERDAL  M-TABS) 1 MG disintegrating tablet Take 1 tablet (1 mg total) by mouth 2 (two) times daily for 10 days. 04/15/23 04/25/23  Edson Graces, MD  amantadine  (SYMMETREL ) 100 MG capsule Take 1 capsule (100 mg total) by mouth 2 (two) times daily. Patient not taking: Reported on 11/04/2019 09/01/19 11/17/19  Doneen Fuelling, NP      Allergies    Patient has no known allergies.    Review of Systems   Review of Systems  Constitutional:        Needs meds  All other systems reviewed and are negative.   Physical Exam Updated Vital Signs BP (!) 127/93    Pulse 81   Temp 97.9 F (36.6 C)   Resp 16   Ht 5\' 10"  (1.778 m)   Wt 94.3 kg   SpO2 96%   BMI 29.84 kg/m   Physical Exam Vitals and nursing note reviewed.  Constitutional:      Appearance: He is well-developed.  HENT:     Head: Normocephalic and atraumatic.  Eyes:     Conjunctiva/sclera: Conjunctivae normal.     Pupils: Pupils are equal, round, and reactive to light.  Cardiovascular:     Rate and Rhythm: Normal rate and regular rhythm.     Heart sounds: Normal heart sounds.  Pulmonary:     Effort: Pulmonary effort is normal.     Breath sounds: Normal breath sounds.  Musculoskeletal:        General: Normal range of motion.     Cervical back: Normal range of motion.  Skin:    General: Skin is warm and dry.  Neurological:     Mental Status: He is alert and oriented to person, place, and time.     ED Results / Procedures / Treatments   Labs (all labs ordered are listed, but only abnormal results are displayed) Labs Reviewed - No data to display  EKG None  Radiology No results found.  Procedures Procedures    Medications Ordered in ED Medications  risperiDONE  (RISPERDAL ) tablet 1 mg (1 mg Oral Given  04/22/24 0349)    ED Course/ Medical Decision Making/ A&P                                 Medical Decision Making Risk Prescription drug management.   37 year old male well-known to the ED requesting dose of his Risperdal .  No acute complaints.  No SI/HI/AVH.  He was given Risperdal  as requested.  Stable for discharge.  Final Clinical Impression(s) / ED Diagnoses Final diagnoses:  Medication management    Rx / DC Orders ED Discharge Orders     None         Coretha Dew, PA-C 04/22/24 0417    Rory Collard, MD 04/22/24 [phone]

## 2024-04-23 ENCOUNTER — Emergency Department (HOSPITAL_COMMUNITY)
Admission: EM | Admit: 2024-04-23 | Discharge: 2024-04-23 | Disposition: A | Discharge status: Home / Self Care | Payer: Self-pay | Attending: Emergency Medicine | Admitting: Emergency Medicine

## 2024-04-23 ENCOUNTER — Encounter (HOSPITAL_COMMUNITY): Payer: Self-pay

## 2024-04-23 ENCOUNTER — Other Ambulatory Visit: Payer: Self-pay

## 2024-04-23 DIAGNOSIS — Z760 Encounter for issue of repeat prescription: Secondary | ICD-10-CM | POA: Insufficient documentation

## 2024-04-23 DIAGNOSIS — Z79899 Other long term (current) drug therapy: Secondary | ICD-10-CM

## 2024-04-23 MED ORDER — RISPERIDONE 0.5 MG PO TABS
1.0000 mg | ORAL_TABLET | Freq: Once | ORAL | Status: AC
  Administered 2024-04-23: 1 mg via ORAL
  Filled 2024-04-23: qty 2

## 2024-04-23 NOTE — ED Triage Notes (Signed)
 Pt came in asking for a dose of Risperdal , because he ran out of his prescription.

## 2024-04-23 NOTE — ED Provider Notes (Signed)
 [place] EMERGENCY DEPARTMENT AT [facility] Provider Note   CSN: [id number] Arrival date & time: 04/23/24  2131     History  No chief complaint on file.   Cole Barrett is a 38 y.o. male.  Patient presents emergency department requesting his nighttime dose of Risperdal .  Past medical history significant for adjustment disorder, alcohol abuse, chronic auditory hallucinations, schizophrenia.  He was unable to get his prescription today due to [facility] Day holiday.  He reports that he has a dose tonight and should be on his prescription tomorrow.  He denies SI, HI, AVH at this time.  He denies other complaints.  HPI     Home Medications Prior to Admission medications   Medication Sig Start Date End Date Taking? Authorizing Provider  acetaminophen  (TYLENOL  8 HOUR) 650 MG CR tablet Take 1 tablet (650 mg total) by mouth every 8 (eight) hours as needed for pain. 02/14/23   Aberman, Caroline C, PA-C  ibuprofen  (ADVIL ) 600 MG tablet Take 1 tablet (600 mg total) by mouth every 6 (six) hours as needed. Patient taking differently: Take 600 mg by mouth every 6 (six) hours as needed for mild pain. 12/24/22   Harris, Abigail, PA-C  naproxen  (NAPROSYN ) 375 MG tablet Take 1 tablet (375 mg total) by mouth 2 (two) times daily. 02/14/23   Kommor, Madison, MD  risperiDONE  (RISPERDAL  M-TABS) 1 MG disintegrating tablet Take 1 tablet (1 mg total) by mouth 2 (two) times daily for 10 days. 04/15/23 04/25/23  Edson Graces, MD  amantadine  (SYMMETREL ) 100 MG capsule Take 1 capsule (100 mg total) by mouth 2 (two) times daily. Patient not taking: Reported on 11/04/2019 09/01/19 11/17/19  Doneen Fuelling, NP      Allergies    Patient has no known allergies.    Review of Systems   Review of Systems  Physical Exam Updated Vital Signs BP (!) 157/90   Pulse 76   Temp 98.2 F (36.8 C)   Resp 18   SpO2 100%  Physical Exam Vitals and nursing note reviewed.  HENT:     Head: Normocephalic  and atraumatic.  Eyes:     Conjunctiva/sclera: Conjunctivae normal.  Pulmonary:     Effort: Pulmonary effort is normal. No respiratory distress.  Musculoskeletal:        General: No signs of injury.     Cervical back: Normal range of motion.  Skin:    General: Skin is dry.  Neurological:     Mental Status: He is alert.  Psychiatric:        Speech: Speech normal.        Behavior: Behavior normal.     ED Results / Procedures / Treatments   Labs (all labs ordered are listed, but only abnormal results are displayed) Labs Reviewed - No data to display  EKG None  Radiology No results found.  Procedures Procedures    Medications Ordered in ED Medications  risperiDONE  (RISPERDAL ) tablet 1 mg (1 mg Oral Given 04/23/24 2245)    ED Course/ Medical Decision Making/ A&P                                 Medical Decision Making Risk Prescription drug management.   38 year old male patient well-known to the emergency department.  Patient requesting dose of Risperdal  with no acute complaints.  No indication for IVC or emergent psychiatric evaluation.  Patient was administered his prescribed  Risperdal  as requested.  He is stable for discharge home at this time.        Final Clinical Impression(s) / ED Diagnoses Final diagnoses:  Medication management    Rx / DC Orders ED Discharge Orders     None         Delories Fetter 04/23/24 2250    Burnette Carte, MD 04/23/24 [phone]

## 2024-04-23 NOTE — Discharge Instructions (Signed)
 Please follow-up with your primary team as needed for further refills.  I have attached contact information for the behavioral urgent care to be used for urgent behavioral health needs.

## 2024-04-25 ENCOUNTER — Emergency Department (HOSPITAL_COMMUNITY)
Admission: EM | Admit: 2024-04-25 | Discharge: 2024-04-26 | Disposition: A | Discharge status: Home / Self Care | Payer: Self-pay | Attending: Emergency Medicine | Admitting: Emergency Medicine

## 2024-04-25 ENCOUNTER — Other Ambulatory Visit: Payer: Self-pay

## 2024-04-25 ENCOUNTER — Encounter (HOSPITAL_COMMUNITY): Payer: Self-pay

## 2024-04-25 DIAGNOSIS — Z760 Encounter for issue of repeat prescription: Secondary | ICD-10-CM | POA: Insufficient documentation

## 2024-04-25 NOTE — ED Triage Notes (Signed)
 Pt is here to get his risperidal dose

## 2024-04-26 ENCOUNTER — Encounter (HOSPITAL_COMMUNITY): Payer: Self-pay

## 2024-04-26 ENCOUNTER — Other Ambulatory Visit: Payer: Self-pay

## 2024-04-26 ENCOUNTER — Emergency Department (HOSPITAL_COMMUNITY)
Admission: EM | Admit: 2024-04-26 | Discharge: 2024-04-27 | Disposition: A | Discharge status: Home / Self Care | Payer: Self-pay | Attending: Emergency Medicine | Admitting: Emergency Medicine

## 2024-04-26 DIAGNOSIS — Z760 Encounter for issue of repeat prescription: Secondary | ICD-10-CM | POA: Insufficient documentation

## 2024-04-26 MED ORDER — RISPERIDONE 1 MG PO TABS
1.0000 mg | ORAL_TABLET | Freq: Once | ORAL | Status: AC
  Administered 2024-04-26: 1 mg via ORAL
  Filled 2024-04-26: qty 1

## 2024-04-26 NOTE — ED Provider Notes (Signed)
 [name] EMERGENCY DEPARTMENT AT [facility] Provider Note   CSN: [id number] Arrival date & time: 04/25/24  2310     History  Chief Complaint  Patient presents with   Medication Refill    Cole Barrett is a 38 y.o. male presents to Emergency Department for refill of his Risperdal  dose.  Reports that he has a prescription ready however has not picked it up yet and will pick it up following the dose today.  He denies SI, HI, self-injury.   Medication Refill      Home Medications Prior to Admission medications   Medication Sig Start Date End Date Taking? Authorizing Provider  acetaminophen  (TYLENOL  8 HOUR) 650 MG CR tablet Take 1 tablet (650 mg total) by mouth every 8 (eight) hours as needed for pain. 02/14/23   Aberman, Caroline C, PA-C  ibuprofen  (ADVIL ) 600 MG tablet Take 1 tablet (600 mg total) by mouth every 6 (six) hours as needed. Patient taking differently: Take 600 mg by mouth every 6 (six) hours as needed for mild pain. 12/24/22   Harris, Abigail, PA-C  naproxen  (NAPROSYN ) 375 MG tablet Take 1 tablet (375 mg total) by mouth 2 (two) times daily. 02/14/23   Kommor, Madison, MD  risperiDONE  (RISPERDAL  M-TABS) 1 MG disintegrating tablet Take 1 tablet (1 mg total) by mouth 2 (two) times daily for 10 days. 04/15/23 04/25/23  Edson Graces, MD  amantadine  (SYMMETREL ) 100 MG capsule Take 1 capsule (100 mg total) by mouth 2 (two) times daily. Patient not taking: Reported on 11/04/2019 09/01/19 11/17/19  Doneen Fuelling, NP      Allergies    Patient has no known allergies.    Review of Systems   Review of Systems  Constitutional:  Negative for chills, fatigue and fever.  Respiratory:  Negative for cough, chest tightness, shortness of breath and wheezing.   Cardiovascular:  Negative for chest pain and palpitations.  Gastrointestinal:  Negative for abdominal pain, constipation, diarrhea, nausea and vomiting.  Neurological:  Negative for dizziness, seizures,  weakness, light-headedness, numbness and headaches.    Physical Exam Updated Vital Signs BP 123/77 (BP Location: Right Arm)   Pulse 76   Temp 98.2 F (36.8 C) (Oral)   Resp 18   Ht 5\' 10"  (1.778 m)   Wt 93.9 kg   SpO2 99%   BMI 29.70 kg/m  Physical Exam Vitals and nursing note reviewed.  Constitutional:      General: He is not in acute distress.    Appearance: Normal appearance.  HENT:     Head: Normocephalic and atraumatic.  Eyes:     Conjunctiva/sclera: Conjunctivae normal.  Cardiovascular:     Rate and Rhythm: Normal rate.  Pulmonary:     Effort: Pulmonary effort is normal. No respiratory distress.     Breath sounds: Normal breath sounds.  Skin:    Capillary Refill: Capillary refill takes less than 2 seconds.     Coloration: Skin is not jaundiced or pale.  Neurological:     Mental Status: He is alert and oriented to person, place, and time. Mental status is at baseline.  Psychiatric:        Attention and Perception: Attention normal. He is attentive. He does not perceive auditory or visual hallucinations.        Mood and Affect: Mood normal.        Speech: Speech normal.        Behavior: Behavior normal. Behavior is cooperative.  Thought Content: Thought content is not paranoid or delusional. Thought content does not include homicidal or suicidal ideation. Thought content does not include homicidal or suicidal plan.        Cognition and Memory: Cognition normal.     ED Results / Procedures / Treatments   Labs (all labs ordered are listed, but only abnormal results are displayed) Labs Reviewed - No data to display  EKG None  Radiology No results found.  Procedures Procedures    Medications Ordered in ED Medications  risperiDONE  (RISPERDAL ) tablet 1 mg (1 mg Oral Given 04/26/24 1610)    ED Course/ Medical Decision Making/ A&P                                 Medical Decision Making Risk Prescription drug management.   Patient presents to  the ED for concern of med refill   Co morbidities that complicate the patient evaluation  See HPI   Additional history obtained:  Additional history obtained from Nursing and Outside Medical Records   External records from outside source obtained and reviewed including triage RN note, ED notes for similar request for med      Medicines ordered and prescription drug management:  I ordered medication including risperdone  as prescribed     Problem List / ED Course:  Medication refill Fully alert and oriented No complaints of SI, HI, self-injury   Reevaluation:  After the interventions noted above, I reevaluated the patient and found that they have :stayed the same    Dispostion:  After consideration of the diagnostic results and the patients response to treatment, I feel that the patent would benefit from outpatient management.   Discussed return precautions with patient expressed understanding with plan.  Final Clinical Impression(s) / ED Diagnoses Final diagnoses:  Medication refill    Rx / DC Orders ED Discharge Orders     None         Royann Cords, PA 04/26/24 0731    Edson Graces, MD 04/30/24 [phone]

## 2024-04-26 NOTE — ED Triage Notes (Signed)
 Seeking daily dose of Risperdal .

## 2024-04-26 NOTE — ED Notes (Signed)
 Patient access has been calling pt for 5 hours - no response

## 2024-04-26 NOTE — ED Notes (Signed)
 Discharge instructions reviewed.   Opportunity for questions and concerns provided.   Alert, oriented and ambulatory.   Displays no signs of distress.   Declined discharge vital signs.

## 2024-04-27 MED ORDER — RISPERIDONE 1 MG PO TABS
1.0000 mg | ORAL_TABLET | Freq: Once | ORAL | Status: AC
  Administered 2024-04-27: 1 mg via ORAL
  Filled 2024-04-27: qty 1

## 2024-04-27 MED ORDER — RISPERIDONE 1 MG PO TABS
1.0000 mg | ORAL_TABLET | Freq: Once | ORAL | Status: DC
  Filled 2024-04-27: qty 1

## 2024-04-27 NOTE — ED Provider Notes (Signed)
 [place] EMERGENCY DEPARTMENT AT [facility] Provider Note   CSN: [id number] Arrival date & time: 04/26/24  2327     History  Chief Complaint  Patient presents with   Medication Refill    Cole Barrett is a 38 y.o. male.  Patient presenting to the emergency department requesting his nightly Risperdal  dose.  He states that he is unsure if his prescription is ready at the pharmacy.  He was seen multiple times in the past week for the same complaint.  It is unclear as to whether or not this prescription is available to the patient.  He denies any SI, HI, AVH.  No other complaints.   Medication Refill      Home Medications Prior to Admission medications   Medication Sig Start Date End Date Taking? Authorizing Provider  acetaminophen  (TYLENOL  8 HOUR) 650 MG CR tablet Take 1 tablet (650 mg total) by mouth every 8 (eight) hours as needed for pain. 02/14/23   Aberman, Caroline C, PA-C  ibuprofen  (ADVIL ) 600 MG tablet Take 1 tablet (600 mg total) by mouth every 6 (six) hours as needed. Patient taking differently: Take 600 mg by mouth every 6 (six) hours as needed for mild pain. 12/24/22   Harris, Abigail, PA-C  naproxen  (NAPROSYN ) 375 MG tablet Take 1 tablet (375 mg total) by mouth 2 (two) times daily. 02/14/23   Kommor, Madison, MD  risperiDONE  (RISPERDAL  M-TABS) 1 MG disintegrating tablet Take 1 tablet (1 mg total) by mouth 2 (two) times daily for 10 days. 04/15/23 04/25/23  Edson Graces, MD  amantadine  (SYMMETREL ) 100 MG capsule Take 1 capsule (100 mg total) by mouth 2 (two) times daily. Patient not taking: Reported on 11/04/2019 09/01/19 11/17/19  Doneen Fuelling, NP      Allergies    Patient has no known allergies.    Review of Systems   Review of Systems  Physical Exam Updated Vital Signs BP 114/74   Pulse 74   Temp 98 F (36.7 C)   Resp 16   SpO2 99%  Physical Exam Vitals and nursing note reviewed.  HENT:     Head: Normocephalic and atraumatic.   Eyes:     Conjunctiva/sclera: Conjunctivae normal.  Pulmonary:     Effort: Pulmonary effort is normal. No respiratory distress.  Musculoskeletal:        General: No signs of injury.     Cervical back: Normal range of motion.  Skin:    General: Skin is dry.  Neurological:     Mental Status: He is alert.  Psychiatric:        Speech: Speech normal.        Behavior: Behavior normal.     ED Results / Procedures / Treatments   Labs (all labs ordered are listed, but only abnormal results are displayed) Labs Reviewed - No data to display  EKG None  Radiology No results found.  Procedures Procedures    Medications Ordered in ED Medications  risperiDONE  (RISPERDAL ) tablet 1 mg (has no administration in time range)    ED Course/ Medical Decision Making/ A&P                                 Medical Decision Making Risk Prescription drug management.   Patient well-known to the emergency department requesting a dose of Risperdal  with no acute complaints.  No indication for IVC or emergent psychiatric evaluation.  He  was administered his prescribed Risperdal  as requested and is stable for discharge home.  I did advise the patient to follow-up with his mental health providers for refills if needed.        Final Clinical Impression(s) / ED Diagnoses Final diagnoses:  Medication refill    Rx / DC Orders ED Discharge Orders     None         Delories Fetter 04/27/24 Raylene Calamity, MD 04/27/24 [phone]

## 2024-04-27 NOTE — Discharge Instructions (Signed)
 Please follow-up with your mental health providers for further refills as needed.

## 2024-05-29 ENCOUNTER — Encounter (HOSPITAL_COMMUNITY): Payer: Self-pay

## 2024-05-29 ENCOUNTER — Emergency Department (HOSPITAL_COMMUNITY)
Admission: EM | Admit: 2024-05-29 | Discharge: 2024-05-29 | Disposition: A | Discharge status: Home / Self Care | Payer: Self-pay | Attending: Emergency Medicine | Admitting: Emergency Medicine

## 2024-05-29 ENCOUNTER — Other Ambulatory Visit: Payer: Self-pay

## 2024-05-29 DIAGNOSIS — Z760 Encounter for issue of repeat prescription: Secondary | ICD-10-CM | POA: Insufficient documentation

## 2024-05-29 MED ORDER — RISPERIDONE 1 MG PO TABS
1.0000 mg | ORAL_TABLET | Freq: Once | ORAL | Status: AC
  Administered 2024-05-29: 1 mg via ORAL
  Filled 2024-05-29 (×2): qty 1

## 2024-05-29 NOTE — ED Provider Notes (Signed)
 [place] EMERGENCY DEPARTMENT AT [facility] Provider Note   CSN: [id number] Arrival date & time: 05/29/24  9985     Patient presents with: Medication Refill   Cole Barrett is a 38 y.o. male.   Patient presents today requesting his nightly dose of risperidone . Several previous presentations for same. Has difficulty obtaining refills. Denies any current complaints.  The history is provided by the patient. No language interpreter was used.  Medication Refill      Prior to Admission medications   Medication Sig Start Date End Date Taking? Authorizing Provider  acetaminophen  (TYLENOL  8 HOUR) 650 MG CR tablet Take 1 tablet (650 mg total) by mouth every 8 (eight) hours as needed for pain. 02/14/23   Aberman, Caroline C, PA-C  ibuprofen  (ADVIL ) 600 MG tablet Take 1 tablet (600 mg total) by mouth every 6 (six) hours as needed. Patient taking differently: Take 600 mg by mouth every 6 (six) hours as needed for mild pain. 12/24/22   Harris, Abigail, PA-C  naproxen  (NAPROSYN ) 375 MG tablet Take 1 tablet (375 mg total) by mouth 2 (two) times daily. 02/14/23   Kommor, Madison, MD  risperiDONE  (RISPERDAL  M-TABS) 1 MG disintegrating tablet Take 1 tablet (1 mg total) by mouth 2 (two) times daily for 10 days. 04/15/23 04/25/23  Theadore Ozell HERO, MD  amantadine  (SYMMETREL ) 100 MG capsule Take 1 capsule (100 mg total) by mouth 2 (two) times daily. Patient not taking: Reported on 11/04/2019 09/01/19 11/17/19  [name], NP    Allergies: Patient has no known allergies.    Review of Systems  All other systems reviewed and are negative.   Updated Vital Signs BP (!) 144/89   Pulse 81   Temp 98.2 F (36.8 C)   Resp 17   Ht 5' 10 (1.778 m)   Wt 93.9 kg   SpO2 98%   BMI 29.70 kg/m   Physical Exam Vitals and nursing note reviewed.  Constitutional:      General: He is not in acute distress.    Appearance: Normal appearance. He is normal weight. He is not ill-appearing,  toxic-appearing or diaphoretic.  HENT:     Head: Normocephalic and atraumatic.  Cardiovascular:     Rate and Rhythm: Normal rate.  Pulmonary:     Effort: Pulmonary effort is normal. No respiratory distress.  Musculoskeletal:        General: Normal range of motion.     Cervical back: Normal range of motion.  Skin:    General: Skin is warm and dry.  Neurological:     General: No focal deficit present.     Mental Status: He is alert.  Psychiatric:        Mood and Affect: Mood normal.        Behavior: Behavior normal.     (all labs ordered are listed, but only abnormal results are displayed) Labs Reviewed - No data to display  EKG: None  Radiology: No results found.   Procedures   Medications Ordered in the ED  risperiDONE  (RISPERDAL ) tablet 1 mg (has no administration in time range)                                    Medical Decision Making Risk Prescription drug management.   Patient presents today requesting his nightly dose of risperidone .  He is afebrile, nontoxic-appearing, and in no acute distress reassuring vital signs.  Patient well-appearing, seen eating a sandwich in the room in no obvious distress.  Patient with numerous previous ER visits for similar request.  Patient given his dose of risperidone  per his request, no other needs at this time. Evaluation and diagnostic testing in the emergency department does not suggest an emergent condition requiring admission or immediate intervention beyond what has been performed at this time.  Plan for discharge with close PCP follow-up.  Patient is understanding and amenable with plan, educated on red flag symptoms that would prompt immediate return.  Patient discharged in stable condition.  Final diagnoses:  Medication refill    ED Discharge Orders     None     An After Visit Summary was printed and given to the patient.      Aniza Shor A, PA-C 05/29/24 0435    Jerral Meth, MD 05/29/24 [phone]

## 2024-05-29 NOTE — ED Triage Notes (Signed)
 Pt is here to get his dose of risperidal. No other complaints.

## 2024-05-29 NOTE — Discharge Instructions (Signed)
 We have given you your evening dose of risperidone  per your request.  Return if development of any new or worsening symptoms.

## 2024-05-31 ENCOUNTER — Emergency Department (HOSPITAL_COMMUNITY)
Admission: EM | Admit: 2024-05-31 | Discharge: 2024-06-01 | Disposition: A | Discharge status: Home / Self Care | Payer: Self-pay | Attending: Emergency Medicine | Admitting: Emergency Medicine

## 2024-05-31 ENCOUNTER — Other Ambulatory Visit: Payer: Self-pay

## 2024-05-31 DIAGNOSIS — Z760 Encounter for issue of repeat prescription: Secondary | ICD-10-CM | POA: Insufficient documentation

## 2024-05-31 DIAGNOSIS — Z79899 Other long term (current) drug therapy: Secondary | ICD-10-CM

## 2024-05-31 DIAGNOSIS — Z5900 Homelessness unspecified: Secondary | ICD-10-CM | POA: Insufficient documentation

## 2024-05-31 DIAGNOSIS — Z8616 Personal history of COVID-19: Secondary | ICD-10-CM | POA: Insufficient documentation

## 2024-05-31 NOTE — ED Triage Notes (Signed)
 Patient requesting his evening dose of Risperidal 1 mg p.o.

## 2024-06-01 MED ORDER — RISPERIDONE 1 MG PO TABS
1.0000 mg | ORAL_TABLET | Freq: Once | ORAL | Status: AC
  Administered 2024-06-01: 1 mg via ORAL
  Filled 2024-06-01 (×2): qty 1

## 2024-06-01 NOTE — ED Notes (Signed)
 Pt not answering to call for room

## 2024-06-01 NOTE — ED Provider Notes (Signed)
 [place] EMERGENCY DEPARTMENT AT [facility] Provider Note   CSN: [id number] Arrival date & time: 05/31/24  2241     Patient presents with: Medication Refill   Cole Barrett is a 38 y.o. male.   HPI     This 38 year old male well-known to our emergency department who presents requesting a dose Risperdal .  He states that he does not have his medication with him.  Denies SI or HI.  Confirms current dosage of 1 mg p.o.  Prior to Admission medications   Medication Sig Start Date End Date Taking? Authorizing Provider  acetaminophen  (TYLENOL  8 HOUR) 650 MG CR tablet Take 1 tablet (650 mg total) by mouth every 8 (eight) hours as needed for pain. 02/14/23   Aberman, Caroline C, PA-C  ibuprofen  (ADVIL ) 600 MG tablet Take 1 tablet (600 mg total) by mouth every 6 (six) hours as needed. Patient taking differently: Take 600 mg by mouth every 6 (six) hours as needed for mild pain. 12/24/22   Harris, Abigail, PA-C  naproxen  (NAPROSYN ) 375 MG tablet Take 1 tablet (375 mg total) by mouth 2 (two) times daily. 02/14/23   Kommor, Madison, MD  risperiDONE  (RISPERDAL  M-TABS) 1 MG disintegrating tablet Take 1 tablet (1 mg total) by mouth 2 (two) times daily for 10 days. 04/15/23 04/25/23  Theadore Ozell HERO, MD  amantadine  (SYMMETREL ) 100 MG capsule Take 1 capsule (100 mg total) by mouth 2 (two) times daily. Patient not taking: Reported on 11/04/2019 09/01/19 11/17/19  [name], NP    Allergies: Patient has no known allergies.    Review of Systems  Constitutional:  Negative for fever.  Respiratory:  Negative for shortness of breath.   Cardiovascular:  Negative for chest pain.  All other systems reviewed and are negative.   Updated Vital Signs BP (!) 144/83 (BP Location: Right Arm)   Pulse 75   Temp 99 F (37.2 C)   Resp 16   SpO2 97%   Physical Exam Vitals and nursing note reviewed.  Constitutional:      Appearance: Normal appearance. He is well-developed.  HENT:      Head: Normocephalic and atraumatic.  Cardiovascular:     Rate and Rhythm: Normal rate and regular rhythm.  Pulmonary:     Effort: Pulmonary effort is normal. No respiratory distress.  Lymphadenopathy:     Cervical: No cervical adenopathy.  Skin:    General: Skin is warm and dry.  Neurological:     Mental Status: He is alert and oriented to person, place, and time.  Psychiatric:     Comments: Cooperative     (all labs ordered are listed, but only abnormal results are displayed) Labs Reviewed - No data to display  EKG: None  Radiology: No results found.   Procedures   Medications Ordered in the ED  risperiDONE  (RISPERDAL ) tablet 1 mg (1 mg Oral Given 06/01/24 0437)                                    Medical Decision Making Risk Prescription drug management.   This patient presents to the ED for concern of medication, this involves an extensive number of treatment options, and is a complaint that carries with it a high risk of complications and morbidity.  I considered the following differential and admission for this acute, potentially life threatening condition.  The differential diagnosis includes needs medication  MDM:  This is a 38 year old male who requests a dose of Resporal.  No other complaints.  History of the same.  (Labs, imaging, consults)  Labs: I Ordered, and personally interpreted labs.  The pertinent results include: None  Imaging Studies ordered: I ordered imaging studies including none I independently visualized and interpreted imaging. I agree with the radiologist interpretation  Additional history obtained from chart review.  External records from outside source obtained and reviewed including prior evaluations  Cardiac Monitoring: The patient was not maintained on a cardiac monitor.  If on the cardiac monitor, I personally viewed and interpreted the cardiac monitored which showed an underlying rhythm of: n/a  Reevaluation: After the  interventions noted above, I reevaluated the patient and found that they have :stayed the same  Social Determinants of Health:  schizophrenia history  Disposition: Discharge  Co morbidities that complicate the patient evaluation  Past Medical History:  Diagnosis Date   COVID-19    Hallucination    Homelessness    Hypercholesterolemia    Malingering    Mental disorder    Paranoid behavior (HCC)    PE (pulmonary embolism)    Schizophrenia (HCC)      Medicines Meds ordered this encounter  Medications   risperiDONE  (RISPERDAL ) tablet 1 mg    I have reviewed the patients home medicines and have made adjustments as needed  Problem List / ED Course: Problem List Items Addressed This Visit   None Visit Diagnoses       Medication management    -  Primary                Final diagnoses:  Medication management    ED Discharge Orders     None          Bari Charmaine FALCON, MD 06/01/24 [phone]

## 2024-06-01 NOTE — Discharge Instructions (Signed)
 It is important that you make sure that you take your medication with you when you travel.  It is inappropriate to come to the emergency room for doses of your medications.

## 2024-06-01 NOTE — ED Notes (Signed)
 Pt refused vitals at this time.

## 2024-06-02 ENCOUNTER — Emergency Department (HOSPITAL_COMMUNITY)
Admission: EM | Admit: 2024-06-02 | Discharge: 2024-06-03 | Disposition: A | Discharge status: Home / Self Care | Payer: Self-pay | Attending: Emergency Medicine | Admitting: Emergency Medicine

## 2024-06-02 DIAGNOSIS — Z760 Encounter for issue of repeat prescription: Secondary | ICD-10-CM | POA: Insufficient documentation

## 2024-06-02 NOTE — ED Triage Notes (Signed)
 The pt needs med refilled

## 2024-06-03 ENCOUNTER — Other Ambulatory Visit: Payer: Self-pay

## 2024-06-03 ENCOUNTER — Encounter (HOSPITAL_COMMUNITY): Payer: Self-pay | Admitting: *Deleted

## 2024-06-03 MED ORDER — MUPIROCIN CALCIUM 2 % EX CREA: 1.0000 | TOPICAL_CREAM | Freq: Two times a day (BID) | CUTANEOUS | 0 refills | Status: DC

## 2024-06-03 NOTE — ED Notes (Signed)
 Pt requested two sandwiches and two pairs of socks.  RN provided to pt.

## 2024-06-03 NOTE — ED Provider Notes (Signed)
 [place] EMERGENCY DEPARTMENT AT [facility] Provider Note   CSN: [id number] Arrival date & time: 06/02/24  2313     Patient presents with: No chief complaint on file.   Cole Barrett is a 38 y.o. male.   The history is provided by the patient.     Patient would like a refill on bactroban  for his skin.  There is no active infection at this time. No fevers.    Prior to Admission medications   Medication Sig Start Date End Date Taking? Authorizing Provider  mupirocin  cream (BACTROBAN ) 2 % Apply 1 Application topically 2 (two) times daily. 06/03/24  Yes Mandrell Vangilder, MD  acetaminophen  (TYLENOL  8 HOUR) 650 MG CR tablet Take 1 tablet (650 mg total) by mouth every 8 (eight) hours as needed for pain. 02/14/23   Aberman, Caroline C, PA-C  ibuprofen  (ADVIL ) 600 MG tablet Take 1 tablet (600 mg total) by mouth every 6 (six) hours as needed. Patient taking differently: Take 600 mg by mouth every 6 (six) hours as needed for mild pain. 12/24/22   Harris, Abigail, PA-C  naproxen  (NAPROSYN ) 375 MG tablet Take 1 tablet (375 mg total) by mouth 2 (two) times daily. 02/14/23   Kommor, Madison, MD  risperiDONE  (RISPERDAL  M-TABS) 1 MG disintegrating tablet Take 1 tablet (1 mg total) by mouth 2 (two) times daily for 10 days. 04/15/23 04/25/23  Theadore Ozell HERO, MD  amantadine  (SYMMETREL ) 100 MG capsule Take 1 capsule (100 mg total) by mouth 2 (two) times daily. Patient not taking: Reported on 11/04/2019 09/01/19 11/17/19  [name], NP    Allergies: Patient has no known allergies.    Review of Systems  Constitutional:  Negative for fever.  HENT:  Negative for facial swelling.   All other systems reviewed and are negative.   Updated Vital Signs BP (!) 162/91 (BP Location: Right Arm)   Pulse 72   Temp 98.4 F (36.9 C) (Oral)   Resp 14   Ht 5' 10 (1.778 m)   Wt 93.9 kg   SpO2 100%   BMI 29.70 kg/m   Physical Exam Vitals and nursing note reviewed.  Constitutional:       General: He is not in acute distress.    Appearance: Normal appearance. He is well-developed. He is not diaphoretic.  HENT:     Head: Normocephalic and atraumatic.     Nose: Nose normal.  Eyes:     Conjunctiva/sclera: Conjunctivae normal.     Pupils: Pupils are equal, round, and reactive to light.  Cardiovascular:     Rate and Rhythm: Normal rate and regular rhythm.     Pulses: Normal pulses.     Heart sounds: Normal heart sounds.  Pulmonary:     Effort: Pulmonary effort is normal.     Breath sounds: Normal breath sounds. No wheezing or rales.  Abdominal:     General: Bowel sounds are normal.     Palpations: Abdomen is soft.     Tenderness: There is no abdominal tenderness. There is no guarding or rebound.  Musculoskeletal:        General: Normal range of motion.     Cervical back: Normal range of motion and neck supple.  Skin:    General: Skin is warm and dry.     Capillary Refill: Capillary refill takes less than 2 seconds.     Coloration: Skin is not pale.     Findings: No erythema or rash.  Neurological:  General: No focal deficit present.     Mental Status: He is alert and oriented to person, place, and time.     Deep Tendon Reflexes: Reflexes normal.  Psychiatric:        Mood and Affect: Mood normal.     (all labs ordered are listed, but only abnormal results are displayed) Labs Reviewed - No data to display  EKG: None  Radiology: No results found.   Procedures   Medications Ordered in the ED - No data to display                                  Medical Decision Making Patient wants refill on antibiotic ointment   Amount and/or Complexity of Data Reviewed External Data Reviewed: notes.    Details: Previous notes reviewed   Risk Prescription drug management. Risk Details: Exam and vitals benign and reassuring.  RX ordered.  Stable for discharge.       Final diagnoses:  Encounter for medication refill   No signs of systemic illness or  infection. The patient is nontoxic-appearing on exam and vital signs are within normal limits.  I have reviewed the triage vital signs and the nursing notes. Pertinent labs & imaging results that were available during my care of the patient were reviewed by me and considered in my medical decision making (see chart for details). After history, exam, and medical workup I feel the patient has been appropriately medically screened and is safe for discharge home. Pertinent diagnoses were discussed with the patient. Patient was given return precautions.    ED Discharge Orders          Ordered    mupirocin  cream (BACTROBAN ) 2 %  2 times daily        06/03/24 0122               Tsutomu Barfoot, MD 06/03/24 9875

## 2024-06-09 ENCOUNTER — Other Ambulatory Visit: Payer: Self-pay

## 2024-06-09 ENCOUNTER — Encounter (HOSPITAL_COMMUNITY): Payer: Self-pay | Admitting: *Deleted

## 2024-06-09 ENCOUNTER — Emergency Department (HOSPITAL_COMMUNITY)
Admission: EM | Admit: 2024-06-09 | Discharge: 2024-06-09 | Disposition: A | Discharge status: Home / Self Care | Payer: Self-pay | Attending: Emergency Medicine | Admitting: Emergency Medicine

## 2024-06-09 DIAGNOSIS — Z760 Encounter for issue of repeat prescription: Secondary | ICD-10-CM | POA: Insufficient documentation

## 2024-06-09 DIAGNOSIS — F209 Schizophrenia, unspecified: Secondary | ICD-10-CM

## 2024-06-09 DIAGNOSIS — Z5900 Homelessness unspecified: Secondary | ICD-10-CM | POA: Insufficient documentation

## 2024-06-09 MED ORDER — RISPERIDONE 1 MG PO TBDP: 1.0000 mg | ORAL_TABLET | Freq: Two times a day (BID) | ORAL | 0 refills | Status: DC

## 2024-06-09 MED ORDER — RISPERIDONE 1 MG PO TBDP
1.0000 mg | ORAL_TABLET | Freq: Once | ORAL | Status: AC
  Administered 2024-06-09: 1 mg via ORAL
  Filled 2024-06-09: qty 1

## 2024-06-09 NOTE — ED Provider Triage Note (Signed)
 Emergency Medicine Provider Triage Evaluation Note  Cole Barrett , a 38 y.o. male  was evaluated in triage.  Pt complains of needing dose of Risperdal .  Endorses hallucinations.  Denies SI  Review of Systems  Positive: As above Negative: As above  Physical Exam  BP 124/86 (BP Location: Left Arm)   Pulse 95   Temp 98.2 F (36.8 C)   Resp 18   Ht 5' 10 (1.778 m)   Wt 93.9 kg   SpO2 100%   BMI 29.70 kg/m  Gen:   Awake, no distress   Resp:  Normal effort  MSK:   Moves extremities without difficulty  Other:    Medical Decision Making  Medically screening exam initiated at 3:48 PM.  Appropriate orders placed.  Elion Hocker was informed that the remainder of the evaluation will be completed by another provider, this initial triage assessment does not replace that evaluation, and the importance of remaining in the ED until their evaluation is complete.    Hildegard Loge, PA-C 06/09/24 1549

## 2024-06-09 NOTE — ED Provider Notes (Signed)
 [place] EMERGENCY DEPARTMENT AT [facility] Provider Note   CSN: [id number] Arrival date & time: 06/09/24  1511     Patient presents with: Medication Refill   Cole Barrett is a 38 y.o. male.    Medication Refill    4 male with medical history significant for schizophrenia, homelessness presenting to the emergency department with a chief complaint of need for medication refill.  The patient states that he is out of his Resporal.  He denies any auditory or visual hallucinations, denies any SI or HI.  Prior to Admission medications   Medication Sig Start Date End Date Taking? Authorizing Provider  acetaminophen  (TYLENOL  8 HOUR) 650 MG CR tablet Take 1 tablet (650 mg total) by mouth every 8 (eight) hours as needed for pain. 02/14/23   Aberman, Caroline C, PA-C  ibuprofen  (ADVIL ) 600 MG tablet Take 1 tablet (600 mg total) by mouth every 6 (six) hours as needed. Patient taking differently: Take 600 mg by mouth every 6 (six) hours as needed for mild pain. 12/24/22   Harris, Abigail, PA-C  mupirocin  cream (BACTROBAN ) 2 % Apply 1 Application topically 2 (two) times daily. 06/03/24   Palumbo, April, MD  naproxen  (NAPROSYN ) 375 MG tablet Take 1 tablet (375 mg total) by mouth 2 (two) times daily. 02/14/23   Kommor, Madison, MD  risperiDONE  (RISPERDAL  M-TABS) 1 MG disintegrating tablet Take 1 tablet (1 mg total) by mouth 2 (two) times daily. 06/09/24 07/09/24  Jerrol Agent, MD  amantadine  (SYMMETREL ) 100 MG capsule Take 1 capsule (100 mg total) by mouth 2 (two) times daily. Patient not taking: Reported on 11/04/2019 09/01/19 11/17/19  [name], NP    Allergies: Patient has no known allergies.    Review of Systems  All other systems reviewed and are negative.   Updated Vital Signs BP 124/86 (BP Location: Left Arm)   Pulse 95   Temp 98.2 F (36.8 C)   Resp 18   Ht 5' 10 (1.778 m)   Wt 93.9 kg   SpO2 100%   BMI 29.70 kg/m   Physical Exam Vitals and  nursing note reviewed.  Constitutional:      General: He is not in acute distress. HENT:     Head: Normocephalic and atraumatic.  Eyes:     Conjunctiva/sclera: Conjunctivae normal.     Pupils: Pupils are equal, round, and reactive to light.  Cardiovascular:     Rate and Rhythm: Normal rate and regular rhythm.  Pulmonary:     Effort: Pulmonary effort is normal. No respiratory distress.     Breath sounds: Normal breath sounds.  Abdominal:     General: There is no distension.     Tenderness: There is no guarding.  Musculoskeletal:        General: No deformity or signs of injury.     Cervical back: Neck supple.  Skin:    Findings: No lesion or rash.  Neurological:     General: No focal deficit present.     Mental Status: He is alert. Mental status is at baseline.  Psychiatric:        Attention and Perception: Attention and perception normal.        Mood and Affect: Mood and affect normal.        Speech: Speech normal.        Behavior: Behavior normal. Behavior is cooperative.        Thought Content: Thought content normal.     (all labs ordered  are listed, but only abnormal results are displayed) Labs Reviewed - No data to display  EKG: None  Radiology: No results found.   Procedures   Medications Ordered in the ED  risperiDONE  (RISPERDAL  M-TABS) disintegrating tablet 1 mg (has no administration in time range)                                    Medical Decision Making Risk Prescription drug management.    77 male with medical history significant for schizophrenia, homelessness presenting to the emergency department with a chief complaint of need for medication refill.  The patient states that he is out of his Resporal.  He denies any auditory or visual hallucinations, denies any SI or HI.  On arrival, patient vitally stable, had endorsed [place] in triage but is currently denying any, denies SI and HI.  Patient requesting a refill of his Risperdal .  Provided a single dose in the ED, Rx provided. No concern for psychosis or decompensation at this time. Pt stable for DC.     Final diagnoses:  Medication refill  Schizophrenia, unspecified type [facility])    ED Discharge Orders          Ordered    risperiDONE  (RISPERDAL  M-TABS) 1 MG disintegrating tablet  2 times daily        06/09/24 1916               Jerrol Agent, MD 06/09/24 1918

## 2024-06-09 NOTE — Discharge Instructions (Addendum)
 Your risperdal  has been prescribed.

## 2024-06-09 NOTE — ED Notes (Signed)
 Assuming care of this pt at this time. Report from [place], [place]

## 2024-06-09 NOTE — ED Notes (Signed)
 Patient endorses needing refill for risperidone , endorses last dose was last week, unspecified date.

## 2024-06-09 NOTE — ED Triage Notes (Signed)
 The pt wants a medication  he frequents this ed

## 2024-06-12 ENCOUNTER — Emergency Department (HOSPITAL_COMMUNITY)
Admission: EM | Admit: 2024-06-12 | Discharge: 2024-06-13 | Discharge status: Against Medical Advice | Payer: Self-pay | Attending: Emergency Medicine | Admitting: Emergency Medicine

## 2024-06-12 DIAGNOSIS — Z5321 Procedure and treatment not carried out due to patient leaving prior to being seen by health care provider: Secondary | ICD-10-CM | POA: Insufficient documentation

## 2024-06-12 DIAGNOSIS — Z760 Encounter for issue of repeat prescription: Secondary | ICD-10-CM | POA: Insufficient documentation

## 2024-06-13 ENCOUNTER — Encounter (HOSPITAL_COMMUNITY): Payer: Self-pay | Admitting: Emergency Medicine

## 2024-06-13 ENCOUNTER — Other Ambulatory Visit: Payer: Self-pay

## 2024-06-13 NOTE — ED Notes (Signed)
 Patient stated he wanted to get his sandwich at [employer] before he got triaged.  Patient currently at [facility].

## 2024-06-13 NOTE — ED Triage Notes (Signed)
 Patient requesting risperidone .  He takes 1 mg.  States he is out of his meds.  Patient gives verbal consent for MSE.

## 2024-06-13 NOTE — ED Notes (Signed)
 Patient decided he was not waiting any longer.  Patient ambulatory with steady gait out of lobby.

## 2024-06-23 DIAGNOSIS — R443 Hallucinations, unspecified: Secondary | ICD-10-CM | POA: Insufficient documentation

## 2024-06-23 DIAGNOSIS — Z760 Encounter for issue of repeat prescription: Secondary | ICD-10-CM | POA: Insufficient documentation

## 2024-06-24 ENCOUNTER — Other Ambulatory Visit: Payer: Self-pay

## 2024-06-24 ENCOUNTER — Encounter (HOSPITAL_COMMUNITY): Payer: Self-pay | Admitting: *Deleted

## 2024-06-24 ENCOUNTER — Emergency Department (HOSPITAL_COMMUNITY)
Admission: EM | Admit: 2024-06-24 | Discharge: 2024-06-24 | Disposition: A | Discharge status: Home / Self Care | Payer: Self-pay | Attending: Emergency Medicine | Admitting: Emergency Medicine

## 2024-06-24 DIAGNOSIS — Z760 Encounter for issue of repeat prescription: Secondary | ICD-10-CM

## 2024-06-24 DIAGNOSIS — R443 Hallucinations, unspecified: Secondary | ICD-10-CM

## 2024-06-24 MED ORDER — RISPERIDONE 1 MG PO TBDP
1.0000 mg | ORAL_TABLET | Freq: Two times a day (BID) | ORAL | Status: DC
  Administered 2024-06-24: 1 mg via ORAL
  Filled 2024-06-24: qty 1

## 2024-06-24 NOTE — ED Provider Notes (Signed)
  [facility]-EMERGENCY DEPT [facility] Emergency Department Provider Note MRN:  [id number]  Arrival date & time: 06/24/24     Chief Complaint   Medication Refill   History of Present Illness   Cole Barrett is a 38 y.o. year-old male presents to the ED with chief complaint of needing a dose of Risperdal .  He states that he is has not had a dose in several days.  States that he is starting to have some significant hallucinations.  He denies any other complaints.  Denies any recent illness.  History provided by patient.   Review of Systems  Pertinent positive and negative review of systems noted in HPI.    Physical Exam   Vitals:   06/24/24 0015  BP: 136/87  Pulse: 76  Resp: 16  Temp: 98.3 F (36.8 C)  SpO2: 99%    CONSTITUTIONAL:  non toxic-appearing, NAD NEURO:  Alert and oriented x 3, CN 3-12 grossly intact EYES:  eyes equal and reactive ENT/NECK:  Supple, no stridor  CARDIO:  normal rate, regular rhythm, appears well-perfused  PULM:  No respiratory distress, CTAB GI/GU:  non-distended,  MSK/SPINE:  No gross deformities, no edema, moves all extremities  SKIN:  no rash,  atraumatic   *Additional and/or pertinent findings included in MDM below  Diagnostic and Interventional Summary    EKG Interpretation Date/Time:    Ventricular Rate:    PR Interval:    QRS Duration:    QT Interval:    QTC Calculation:   R Axis:      Text Interpretation:         Labs Reviewed - No data to display  No orders to display    Medications  risperiDONE  (RISPERDAL  M-TABS) disintegrating tablet 1 mg (has no administration in time range)     Procedures  /  Critical Care Procedures  ED Course and Medical Decision Making  I have reviewed the triage vital signs, the nursing notes, and pertinent available records from the EMR.  Social Determinants Affecting Complexity of Care: Patient has no clinically significant social determinants affecting this chief  complaint..   ED Course:    Medical Decision Making Patient here with hallucinations.  States that he has not taken his Risperdal  for the past few days.  Is requesting a dose tonight.  Denies any other complaints.  He appears in his normal state of health.  Patient is well-known to this department.  Risk Prescription drug management.         Consultants: No consultations were needed in caring for this patient.   Treatment and Plan: Emergency department workup does not suggest an emergent condition requiring admission or immediate intervention beyond  what has been performed at this time. The patient is safe for discharge and has  been instructed to return immediately for worsening symptoms, change in  symptoms or any other concerns    Final Clinical Impressions(s) / ED Diagnoses     ICD-10-CM   1. Medication refill  Z76.0     2. Hallucinations  R44.3       ED Discharge Orders     None         Discharge Instructions Discussed with and Provided to Patient:   Discharge Instructions   None      Vicky Charleston, PA-C 06/24/24 0139    Raford Lenis, MD 06/24/24 [phone]

## 2024-06-24 NOTE — ED Notes (Signed)
 No answer when called

## 2024-06-24 NOTE — ED Notes (Signed)
 Pt could not be found for triage.

## 2024-06-24 NOTE — ED Triage Notes (Signed)
 The pt had gone to [facility] before he was checked in here  he is here to get med refills

## 2024-06-26 ENCOUNTER — Emergency Department (HOSPITAL_COMMUNITY)
Admission: EM | Admit: 2024-06-26 | Discharge: 2024-06-27 | Disposition: A | Discharge status: Home / Self Care | Payer: Self-pay | Attending: Emergency Medicine | Admitting: Emergency Medicine

## 2024-06-26 DIAGNOSIS — Z760 Encounter for issue of repeat prescription: Secondary | ICD-10-CM | POA: Insufficient documentation

## 2024-06-26 DIAGNOSIS — Z79899 Other long term (current) drug therapy: Secondary | ICD-10-CM

## 2024-06-26 DIAGNOSIS — Z5900 Homelessness unspecified: Secondary | ICD-10-CM | POA: Insufficient documentation

## 2024-06-26 NOTE — ED Notes (Signed)
 The pt went to [facility] as soon as he checked in he went there

## 2024-06-27 ENCOUNTER — Encounter (HOSPITAL_COMMUNITY): Payer: Self-pay | Admitting: *Deleted

## 2024-06-27 ENCOUNTER — Other Ambulatory Visit: Payer: Self-pay

## 2024-06-27 MED ORDER — RISPERIDONE 1 MG PO TABS
1.0000 mg | ORAL_TABLET | Freq: Once | ORAL | Status: AC
  Administered 2024-06-27: 1 mg via ORAL
  Filled 2024-06-27: qty 1

## 2024-06-27 NOTE — ED Provider Notes (Signed)
 [place] EMERGENCY DEPARTMENT AT [facility] Provider Note   CSN: [id number] Arrival date & time: 06/26/24  2321     Patient presents with: Medication Refill   Cole Barrett is a 38 y.o. male.   The history is provided by the patient and medical records.  Medication Refill  38 year old male with history of adjustment disorder, schizophrenia, homelessness, presenting to the ED for respite all.  States he has not had this since he was seen at the hospital about 3 days ago.  He denies any other acute complaints.  No SI/HI.  Prior to Admission medications   Medication Sig Start Date End Date Taking? Authorizing Provider  acetaminophen  (TYLENOL  8 HOUR) 650 MG CR tablet Take 1 tablet (650 mg total) by mouth every 8 (eight) hours as needed for pain. 02/14/23   Aberman, Caroline C, PA-C  ibuprofen  (ADVIL ) 600 MG tablet Take 1 tablet (600 mg total) by mouth every 6 (six) hours as needed. Patient taking differently: Take 600 mg by mouth every 6 (six) hours as needed for mild pain. 12/24/22   Harris, Abigail, PA-C  mupirocin  cream (BACTROBAN ) 2 % Apply 1 Application topically 2 (two) times daily. 06/03/24   Palumbo, April, MD  naproxen  (NAPROSYN ) 375 MG tablet Take 1 tablet (375 mg total) by mouth 2 (two) times daily. 02/14/23   Kommor, Madison, MD  risperiDONE  (RISPERDAL  M-TABS) 1 MG disintegrating tablet Take 1 tablet (1 mg total) by mouth 2 (two) times daily. 06/09/24 07/09/24  Jerrol Agent, MD  amantadine  (SYMMETREL ) 100 MG capsule Take 1 capsule (100 mg total) by mouth 2 (two) times daily. Patient not taking: Reported on 11/04/2019 09/01/19 11/17/19  [name], NP    Allergies: Patient has no known allergies.    Review of Systems  Constitutional:        Medication needs  All other systems reviewed and are negative.   Updated Vital Signs BP 111/67 (BP Location: Right Arm)   Pulse 81   Temp 98.3 F (36.8 C)   Resp 15   Ht 5' 10 (1.778 m)   Wt 94 kg   SpO2  96%   BMI 29.73 kg/m   Physical Exam Vitals and nursing note reviewed.  Constitutional:      Appearance: He is well-developed.     Comments: Sleeping in hallway, awoken for exam  HENT:     Head: Normocephalic and atraumatic.  Eyes:     Conjunctiva/sclera: Conjunctivae normal.     Pupils: Pupils are equal, round, and reactive to light.  Cardiovascular:     Rate and Rhythm: Normal rate and regular rhythm.     Heart sounds: Normal heart sounds.  Pulmonary:     Effort: Pulmonary effort is normal.     Breath sounds: Normal breath sounds.  Musculoskeletal:        General: Normal range of motion.     Cervical back: Normal range of motion.  Skin:    General: Skin is warm and dry.  Neurological:     Mental Status: He is oriented to person, place, and time.  Psychiatric:     Comments: Denies SI/HI     (all labs ordered are listed, but only abnormal results are displayed) Labs Reviewed - No data to display  EKG: None  Radiology: No results found.   Procedures   Medications Ordered in the ED  risperiDONE  (RISPERDAL ) tablet 1 mg (1 mg Oral Given 06/27/24 0153)  Medical Decision Making Risk Prescription drug management.   38 y.o. M here requesting his risperdal .  Multiple prior visits for same.  Has not had medication since last ED visit 3 days ago.  Denies SI/HI.  No acute complaints otherwise.  Given dose tonight.  He already had a refill sent to pharmacy earlier in the month-- needs to pick this up.  Can return here for new concerns.  Final diagnoses:  Medication management    ED Discharge Orders     None          Jarold Olam CHRISTELLA DEVONNA 06/27/24 9786    Lorette Mayo, MD 06/27/24 [phone]

## 2024-06-27 NOTE — ED Triage Notes (Signed)
Pt wants med refill .

## 2024-07-02 ENCOUNTER — Other Ambulatory Visit: Payer: Self-pay

## 2024-07-02 ENCOUNTER — Emergency Department (HOSPITAL_COMMUNITY)
Admission: EM | Admit: 2024-07-02 | Discharge: 2024-07-03 | Disposition: A | Discharge status: Home / Self Care | Payer: Self-pay | Attending: Emergency Medicine | Admitting: Emergency Medicine

## 2024-07-02 DIAGNOSIS — Z760 Encounter for issue of repeat prescription: Secondary | ICD-10-CM | POA: Insufficient documentation

## 2024-07-02 DIAGNOSIS — Z720 Tobacco use: Secondary | ICD-10-CM | POA: Insufficient documentation

## 2024-07-02 DIAGNOSIS — Z5900 Homelessness unspecified: Secondary | ICD-10-CM | POA: Insufficient documentation

## 2024-07-02 MED ORDER — RISPERIDONE 1 MG PO TABS
1.0000 mg | ORAL_TABLET | Freq: Once | ORAL | Status: AC
  Administered 2024-07-03: 1 mg via ORAL
  Filled 2024-07-02: qty 1

## 2024-07-02 NOTE — ED Provider Notes (Signed)
 [place] EMERGENCY DEPARTMENT AT [facility] Provider Note   CSN: [id number] Arrival date & time: 07/02/24  2247     Patient presents with: Medication Refill   Cole Barrett is a 38 y.o. male with past medical history of PE, schizophrenia, polysubstance abuse presents to emergency department for medication refill of risperidone .  Reports he has not had medication since last seen on 06/26/2024.  Has not picked up prescription medicine sent to his pharmacy.  Denies current complaints of pain, hallucinations, SI, HI    Medication Refill      Prior to Admission medications   Medication Sig Start Date End Date Taking? Authorizing Provider  acetaminophen  (TYLENOL  8 HOUR) 650 MG CR tablet Take 1 tablet (650 mg total) by mouth every 8 (eight) hours as needed for pain. 02/14/23   Aberman, Caroline C, PA-C  ibuprofen  (ADVIL ) 600 MG tablet Take 1 tablet (600 mg total) by mouth every 6 (six) hours as needed. Patient taking differently: Take 600 mg by mouth every 6 (six) hours as needed for mild pain. 12/24/22   Harris, Abigail, PA-C  mupirocin  cream (BACTROBAN ) 2 % Apply 1 Application topically 2 (two) times daily. 06/03/24   Palumbo, April, MD  naproxen  (NAPROSYN ) 375 MG tablet Take 1 tablet (375 mg total) by mouth 2 (two) times daily. 02/14/23   Kommor, Madison, MD  risperiDONE  (RISPERDAL  M-TABS) 1 MG disintegrating tablet Take 1 tablet (1 mg total) by mouth 2 (two) times daily. 06/09/24 07/09/24  Jerrol Agent, MD  amantadine  (SYMMETREL ) 100 MG capsule Take 1 capsule (100 mg total) by mouth 2 (two) times daily. Patient not taking: Reported on 11/04/2019 09/01/19 11/17/19  [name], NP    Allergies: Patient has no known allergies.    Review of Systems  Psychiatric/Behavioral:  Negative for suicidal ideas.     Updated Vital Signs BP 139/83 (BP Location: Right Arm)   Pulse 74   Temp 97.8 F (36.6 C)   Resp 17   SpO2 100%   Physical Exam Vitals and nursing note  reviewed.  Constitutional:      General: He is not in acute distress.    Appearance: Normal appearance.  HENT:     Head: Normocephalic and atraumatic.  Eyes:     Conjunctiva/sclera: Conjunctivae normal.  Cardiovascular:     Rate and Rhythm: Normal rate.  Pulmonary:     Effort: Pulmonary effort is normal. No respiratory distress.  Skin:    Coloration: Skin is not jaundiced or pale.  Neurological:     Mental Status: He is alert and oriented to person, place, and time. Mental status is at baseline.  Psychiatric:        Attention and Perception: He does not perceive auditory or visual hallucinations.        Behavior: Behavior normal. Behavior is cooperative.        Thought Content: Thought content is not paranoid or delusional. Thought content does not include homicidal or suicidal ideation. Thought content does not include homicidal or suicidal plan.     (all labs ordered are listed, but only abnormal results are displayed) Labs Reviewed - No data to display  EKG: None  Radiology: No results found.   Medications Ordered in the ED  risperiDONE  (RISPERDAL ) tablet 1 mg (has no administration in time range)  Medical Decision Making Risk Prescription drug management.    Patient presents to the ED for concern of medication refill  Co morbidities that complicate the patient evaluation  Schizoaffective disorder Medication noncompliance   Additional history obtained:  Additional history obtained from Nursing and Outside Medical Records   External records from outside source obtained and reviewed including triage note, most recent ED note from 06/26/2024    Medicines ordered and prescription drug management:  I ordered medication including Risperdal  as prescribed  I have reviewed the patients home medicines and have made adjustments as needed     Problem List / ED Course:  Medication refill Well-known to department for  refill Does have prescription that he has not picked up Denies SI, HI, self-injury, hallucinations.A&Ox3   Reevaluation:  After the interventions noted above, I reevaluated the patient and found that they have :stayed the same   Social Determinants of Health:  Current tobacco abuse Homeless   Dispostion:  After consideration of the diagnostic results and the patients response to treatment, I feel that the patent would benefit from picking up prescription medication and medication compliance.   Discussed ED workup, disposition, return to ED precautions with patient who expresses understanding agrees with plan.  All questions answered to their satisfaction.  They are agreeable to plan.  Discharge instructions provided on paperwork  Final diagnoses:  Encounter for medication refill    ED Discharge Orders     None        Minnie Tinnie BRAVO, PA 07/02/24 2352    Randol Simmonds, MD 07/03/24 1113

## 2024-07-02 NOTE — ED Triage Notes (Signed)
 Patient requesting Risperidal 1 mg dose this evening .

## 2024-07-02 NOTE — Discharge Instructions (Addendum)
 Please pick up prescription medication at [address] [place] [address]   Return for suicidal ideation

## 2024-07-05 ENCOUNTER — Encounter (HOSPITAL_COMMUNITY): Payer: Self-pay | Admitting: *Deleted

## 2024-07-05 ENCOUNTER — Other Ambulatory Visit: Payer: Self-pay

## 2024-07-05 ENCOUNTER — Emergency Department (HOSPITAL_COMMUNITY)
Admission: EM | Admit: 2024-07-05 | Discharge: 2024-07-05 | Disposition: A | Discharge status: Home / Self Care | Payer: Self-pay | Attending: Emergency Medicine | Admitting: Emergency Medicine

## 2024-07-05 DIAGNOSIS — Z8616 Personal history of COVID-19: Secondary | ICD-10-CM | POA: Insufficient documentation

## 2024-07-05 DIAGNOSIS — Z7689 Persons encountering health services in other specified circumstances: Secondary | ICD-10-CM

## 2024-07-05 DIAGNOSIS — Z5900 Homelessness unspecified: Secondary | ICD-10-CM | POA: Insufficient documentation

## 2024-07-05 DIAGNOSIS — F17210 Nicotine dependence, cigarettes, uncomplicated: Secondary | ICD-10-CM | POA: Insufficient documentation

## 2024-07-05 DIAGNOSIS — Z0289 Encounter for other administrative examinations: Secondary | ICD-10-CM | POA: Insufficient documentation

## 2024-07-05 MED ORDER — RISPERIDONE 1 MG PO TBDP: 1.0000 mg | ORAL_TABLET | Freq: Two times a day (BID) | ORAL | 0 refills | Status: DC

## 2024-07-05 MED ORDER — RISPERIDONE 1 MG PO TABS
1.0000 mg | ORAL_TABLET | Freq: Once | ORAL | Status: AC
  Administered 2024-07-05: 1 mg via ORAL
  Filled 2024-07-05: qty 1

## 2024-07-05 NOTE — ED Provider Notes (Signed)
 [place] EMERGENCY DEPARTMENT AT [facility] Provider Note  CSN: [id number] Arrival date & time: 07/05/24 9363  Chief Complaint(s) Medication Refill  HPI Cole Barrett is a 38 y.o. male hx of schizophrenia presenting requesting dose of risperidone . Patient reports chronic hearing voices and denies any change.  He comes to the ER frequently for dose of Risperdal  denies any thoughts of wanting to hurt himself or hurt others.  Denies any medical complaints   Past Medical History Past Medical History:  Diagnosis Date   COVID-19    Hallucination    Homelessness    Hypercholesterolemia    Malingering    Mental disorder    Paranoid behavior (HCC)    PE (pulmonary embolism)    Schizophrenia (HCC)    Patient Active Problem List   Diagnosis Date Noted   Acute psychosis (HCC) 08/31/2019   Schizophrenia (HCC) 08/31/2019   Schizoaffective disorder, bipolar type (HCC) 01/19/2019   Alcohol abuse    Auditory hallucinations    Alcohol abuse with alcohol-induced mood disorder (HCC) 09/08/2016   Homelessness 09/03/2016   Person feigning illness 09/03/2016   Brief psychotic disorder (HCC) 08/29/2016   Cannabis use disorder, mild, abuse 08/29/2016   Pulmonary emboli (HCC) 07/13/2016   Adjustment disorder with emotional disturbance 03/12/2014   Pulmonary embolism and infarction (HCC) 01/02/2014   Acute left flank pain 01/02/2014   Pulmonary embolism, bilateral (HCC) 01/02/2014   Home Medication(s) Prior to Admission medications   Medication Sig Start Date End Date Taking? Authorizing Provider  acetaminophen  (TYLENOL  8 HOUR) 650 MG CR tablet Take 1 tablet (650 mg total) by mouth every 8 (eight) hours as needed for pain. 02/14/23   Aberman, Caroline C, PA-C  ibuprofen  (ADVIL ) 600 MG tablet Take 1 tablet (600 mg total) by mouth every 6 (six) hours as needed. Patient taking differently: Take 600 mg by mouth every 6 (six) hours as needed for mild pain. 12/24/22   Harris,  Abigail, PA-C  mupirocin  cream (BACTROBAN ) 2 % Apply 1 Application topically 2 (two) times daily. 06/03/24   Palumbo, April, MD  naproxen  (NAPROSYN ) 375 MG tablet Take 1 tablet (375 mg total) by mouth 2 (two) times daily. 02/14/23   Kommor, Madison, MD  risperiDONE  (RISPERDAL  M-TABS) 1 MG disintegrating tablet Take 1 tablet (1 mg total) by mouth 2 (two) times daily. 07/05/24 08/04/24  Francesca Elsie CROME, MD  amantadine  (SYMMETREL ) 100 MG capsule Take 1 capsule (100 mg total) by mouth 2 (two) times daily. Patient not taking: Reported on 11/04/2019 09/01/19 11/17/19  [name], NP                                                                                                                                    Past Surgical History History reviewed. No pertinent surgical history. Family History Family History  Problem Relation Age of Onset   Hypertension Mother     Social History Social  History   Tobacco Use   Smoking status: Every Day    Current packs/day: 0.50    Types: Cigarettes   Smokeless tobacco: Never  Vaping Use   Vaping status: Never Used  Substance Use Topics   Alcohol use: Yes    Comment: occ   Drug use: Not Currently    Types: Marijuana    Comment: Last used: 2 months ago UDS NA   Allergies Patient has no known allergies.  Review of Systems Review of Systems  All other systems reviewed and are negative.   Physical Exam Vital Signs  I have reviewed the triage vital signs BP (!) 151/90 (BP Location: Right Arm)   Pulse 65   Temp 98.3 F (36.8 C)   Resp 20   Ht 5' 10 (1.778 m)   Wt 94 kg   SpO2 100%   BMI 29.73 kg/m  Physical Exam Vitals and nursing note reviewed.  Constitutional:      General: He is not in acute distress.    Appearance: Normal appearance.  HENT:     Head: Normocephalic and atraumatic.     Mouth/Throat:     Mouth: Mucous membranes are moist.  Eyes:     Conjunctiva/sclera: Conjunctivae normal.  Cardiovascular:     Rate and Rhythm:  Normal rate.  Pulmonary:     Effort: Pulmonary effort is normal. No respiratory distress.  Abdominal:     General: Abdomen is flat.  Skin:    General: Skin is warm and dry.     Capillary Refill: Capillary refill takes less than 2 seconds.  Neurological:     General: No focal deficit present.     Mental Status: He is alert. Mental status is at baseline.  Psychiatric:        Mood and Affect: Mood normal.        Behavior: Behavior normal.     ED Results and Treatments Labs (all labs ordered are listed, but only abnormal results are displayed) Labs Reviewed - No data to display                                                                                                                        Radiology No results found.  Pertinent labs & imaging results that were available during my care of the patient were reviewed by me and considered in my medical decision making (see MDM for details).  Medications Ordered in ED Medications  risperiDONE  (RISPERDAL ) tablet 1 mg (has no administration in time range)  Procedures Procedures  (including critical care time)  Medical Decision Making / ED Course   MDM:  38 year old presenting with request for medication.  Patient is overall very well-appearing.  His examination is reassuring.  He is not reacting to internal stimuli.  He denies any other psychiatric complaints like SI or HI.  I have also sent prescription for risperidone .   Will discharge patient to home. All questions answered. Patient comfortable with plan of discharge. Return precautions discussed with patient and specified on the after visit summary.      Medicines ordered and prescription drug management: Meds ordered this encounter  Medications   risperiDONE  (RISPERDAL ) tablet 1 mg   DISCONTD: risperiDONE  (RISPERDAL  M-TABS) 1 MG  disintegrating tablet    Sig: Take 1 tablet (1 mg total) by mouth 2 (two) times daily.    Dispense:  60 tablet    Refill:  0   risperiDONE  (RISPERDAL  M-TABS) 1 MG disintegrating tablet    Sig: Take 1 tablet (1 mg total) by mouth 2 (two) times daily.    Dispense:  60 tablet    Refill:  0    -I have reviewed the patients home medicines and have made adjustments as needed Co morbidities that complicate the patient evaluation  Past Medical History:  Diagnosis Date   COVID-19    Hallucination    Homelessness    Hypercholesterolemia    Malingering    Mental disorder    Paranoid behavior (HCC)    PE (pulmonary embolism)    Schizophrenia (HCC)       Dispostion: Disposition decision including need for hospitalization was considered, and patient discharged from emergency department.    Final Clinical Impression(s) / ED Diagnoses Final diagnoses:  Encounter for medication administration     This chart was dictated using voice recognition software.  Despite best efforts to proofread,  errors can occur which can change the documentation meaning.    Francesca Elsie CROME, MD 07/05/24 [phone]

## 2024-07-05 NOTE — Discharge Instructions (Addendum)
 We have given you a dose of risperidone . Please return if you have any new symptoms. We sent a prescription to your pharmacy.

## 2024-07-05 NOTE — ED Triage Notes (Signed)
 States he needs his morning medication before going to work.

## 2024-07-14 ENCOUNTER — Other Ambulatory Visit: Payer: Self-pay

## 2024-07-14 ENCOUNTER — Emergency Department (HOSPITAL_COMMUNITY)
Admission: EM | Admit: 2024-07-14 | Discharge: 2024-07-14 | Disposition: A | Discharge status: Home / Self Care | Payer: Self-pay | Attending: Emergency Medicine | Admitting: Emergency Medicine

## 2024-07-14 ENCOUNTER — Encounter (HOSPITAL_COMMUNITY): Payer: Self-pay

## 2024-07-14 DIAGNOSIS — Z760 Encounter for issue of repeat prescription: Secondary | ICD-10-CM | POA: Insufficient documentation

## 2024-07-14 DIAGNOSIS — Z5900 Homelessness unspecified: Secondary | ICD-10-CM | POA: Insufficient documentation

## 2024-07-14 MED ORDER — RISPERIDONE 0.5 MG PO TABS
1.0000 mg | ORAL_TABLET | Freq: Once | ORAL | Status: AC
  Administered 2024-07-14: 1 mg via ORAL
  Filled 2024-07-14: qty 2

## 2024-07-14 MED ORDER — RISPERIDONE 1 MG PO TABS: 1.0000 mg | ORAL_TABLET | Freq: Two times a day (BID) | ORAL | 0 refills | Status: DC

## 2024-07-14 NOTE — ED Notes (Signed)
 Provided with a pair of shoes from the closet and provided with something to eat.

## 2024-07-14 NOTE — Discharge Instructions (Addendum)
 Today you were seen for medication refill.  Please pick up your medication and take as prescribed.  Thank you for letting us  treat you today. After performing a physical exam, I feel you are safe to go home. Please follow up with your PCP in the next several days and provide them with your records from this visit. Return to the Emergency Room if pain becomes severe or symptoms worsen.

## 2024-07-14 NOTE — ED Triage Notes (Signed)
 Patient here needing his Risperdal .  No complaints and no thoughts of SI.  Calm and cooperative

## 2024-07-14 NOTE — ED Provider Notes (Signed)
 [place] EMERGENCY DEPARTMENT AT [facility] Provider Note   CSN: [id number] Arrival date & time: 07/14/24  9164     Patient presents with: Medication Refill   Cole Barrett is a 38 y.o. male past medical history significant for schizophrenia and homelessness presents today for Risperdal .  Patient has been seen numerous times in the ED for same.  Patient denies HI/SI/AVH.  Patient denies any other complaints at this time.    Medication Refill      Prior to Admission medications   Medication Sig Start Date End Date Taking? Authorizing Provider  risperiDONE  (RISPERDAL ) 1 MG tablet Take 1 tablet (1 mg total) by mouth 2 (two) times daily. 07/14/24  Yes Ikeem Cleckler N, PA-C  acetaminophen  (TYLENOL  8 HOUR) 650 MG CR tablet Take 1 tablet (650 mg total) by mouth every 8 (eight) hours as needed for pain. 02/14/23   Aberman, Caroline C, PA-C  ibuprofen  (ADVIL ) 600 MG tablet Take 1 tablet (600 mg total) by mouth every 6 (six) hours as needed. Patient taking differently: Take 600 mg by mouth every 6 (six) hours as needed for mild pain. 12/24/22   Harris, Abigail, PA-C  mupirocin  cream (BACTROBAN ) 2 % Apply 1 Application topically 2 (two) times daily. 06/03/24   Palumbo, April, MD  naproxen  (NAPROSYN ) 375 MG tablet Take 1 tablet (375 mg total) by mouth 2 (two) times daily. 02/14/23   Kommor, Madison, MD  amantadine  (SYMMETREL ) 100 MG capsule Take 1 capsule (100 mg total) by mouth 2 (two) times daily. Patient not taking: Reported on 11/04/2019 09/01/19 11/17/19  [name], NP    Allergies: Patient has no known allergies.    Review of Systems  Updated Vital Signs BP 132/85 (BP Location: Right Arm)   Pulse (!) 59   Temp 97.8 F (36.6 C) (Oral)   Resp 16   Ht 5' 10 (1.778 m)   Wt 93.9 kg   SpO2 100%   BMI 29.70 kg/m   Physical Exam Vitals and nursing note reviewed.  Constitutional:      General: He is not in acute distress.    Appearance: He is  well-developed.  HENT:     Head: Normocephalic and atraumatic.     Right Ear: External ear normal. There is no impacted cerumen.     Left Ear: External ear normal.  Eyes:     Conjunctiva/sclera: Conjunctivae normal.  Cardiovascular:     Rate and Rhythm: Normal rate and regular rhythm.     Pulses: Normal pulses.     Heart sounds: Normal heart sounds. No murmur heard. Pulmonary:     Effort: Pulmonary effort is normal. No respiratory distress.     Breath sounds: Normal breath sounds.  Abdominal:     Palpations: Abdomen is soft.  Musculoskeletal:        General: No swelling.     Cervical back: Normal range of motion and neck supple.  Skin:    General: Skin is warm and dry.     Capillary Refill: Capillary refill takes less than 2 seconds.  Neurological:     General: No focal deficit present.     Mental Status: He is alert.  Psychiatric:        Mood and Affect: Mood normal.     (all labs ordered are listed, but only abnormal results are displayed) Labs Reviewed - No data to display  EKG: None  Radiology: No results found.   Procedures   Medications Ordered in the  ED  risperiDONE  (RISPERDAL ) tablet 1 mg (1 mg Oral Given 07/14/24 9078)                                    Medical Decision Making  This patient presents to the ED for concern of medication refill differential diagnosis includes medication refill, SI, HI, AVH   Medicines ordered and prescription drug management:  I ordered medication including Risperdal     I have reviewed the patients home medicines and have made adjustments as needed   Problem List / ED Course:  Considered for admission or further workup however patient's vital signs and physical exam are reassuring.  Patient given a refill for few days of his Risperdal .  Patient given return precautions.  I feel patient is safe for discharge at this time.   Social Determinants of Health:  Homelessness      Final diagnoses:  Medication  refill    ED Discharge Orders          Ordered    risperiDONE  (RISPERDAL ) 1 MG tablet  2 times daily        07/14/24 0920               Francis Ileana SAILOR, PA-C 07/14/24 9053    Randol Simmonds, MD 07/15/24 [phone]

## 2024-07-23 ENCOUNTER — Other Ambulatory Visit: Payer: Self-pay

## 2024-07-23 ENCOUNTER — Emergency Department (HOSPITAL_COMMUNITY)
Admission: EM | Admit: 2024-07-23 | Discharge: 2024-07-24 | Disposition: A | Discharge status: Home / Self Care | Payer: Self-pay | Attending: Emergency Medicine | Admitting: Emergency Medicine

## 2024-07-23 ENCOUNTER — Encounter (HOSPITAL_COMMUNITY): Payer: Self-pay

## 2024-07-23 DIAGNOSIS — Z760 Encounter for issue of repeat prescription: Secondary | ICD-10-CM | POA: Insufficient documentation

## 2024-07-23 NOTE — ED Triage Notes (Signed)
 Pt reports he is here to get his Risperdal . Last took over a month ago. Denies any pain. He is calm and cooperative.Denies SI.

## 2024-07-24 MED ORDER — RISPERIDONE 1 MG PO TABS
1.0000 mg | ORAL_TABLET | Freq: Once | ORAL | Status: AC
  Administered 2024-07-24: 1 mg via ORAL
  Filled 2024-07-24: qty 1

## 2024-07-24 NOTE — ED Provider Notes (Signed)
  [place] EMERGENCY DEPARTMENT AT [facility] Provider Note   CSN: [id number] Arrival date & time: 07/23/24  2214     History Chief Complaint  Patient presents with   Medication Refill    HPI Cole Barrett is a 38 y.o. male presenting for medication refill.  No acute complaints.  Was unable to fill his Risperdal .   Patient's recorded medical, surgical, social, medication list and allergies were reviewed in the Snapshot window as part of the initial history.   Review of Systems   Review of Systems  Constitutional:  Negative for chills and fever.  HENT:  Negative for ear pain and sore throat.   Eyes:  Negative for pain and visual disturbance.  Respiratory:  Negative for cough and shortness of breath.   Cardiovascular:  Negative for chest pain and palpitations.  Gastrointestinal:  Negative for abdominal pain and vomiting.  Genitourinary:  Negative for dysuria and hematuria.  Musculoskeletal:  Negative for arthralgias and back pain.  Skin:  Negative for color change and rash.  Neurological:  Negative for seizures and syncope.  All other systems reviewed and are negative.   Physical Exam Updated Vital Signs BP 134/89 (BP Location: Right Arm)   Pulse 78   Temp 98.3 F (36.8 C)   Resp 15   Ht 5' 10 (1.778 m)   Wt 93.9 kg   SpO2 97%   BMI 29.70 kg/m  Physical Exam Vitals and nursing note reviewed.  Constitutional:      General: He is not in acute distress.    Appearance: He is well-developed.  HENT:     Head: Normocephalic and atraumatic.  Eyes:     Conjunctiva/sclera: Conjunctivae normal.  Cardiovascular:     Rate and Rhythm: Normal rate and regular rhythm.  Pulmonary:     Effort: Pulmonary effort is normal. No respiratory distress.  Abdominal:     General: Abdomen is flat. There is no distension.  Musculoskeletal:        General: No swelling or deformity.  Skin:    General: Skin is warm and dry.     Capillary Refill: Capillary refill  takes less than 2 seconds.  Neurological:     Mental Status: He is alert and oriented to person, place, and time. Mental status is at baseline.      ED Course/ Medical Decision Making/ A&P    Procedures Procedures   Medications Ordered in ED Medications  risperiDONE  (RISPERDAL ) tablet 1 mg (has no administration in time range)    Medical Decision Making:   38 year old male requesting dosing of his Risperdal .  Is unable to get in the outpatient setting.  Has talked to social work before, has been educated on how to obtain this medication in the outpatient setting but states he does not plan to and would rather just come here for dosing. History of psychosis outside of medication.  Information Fred for local behavioral health urgent care for more longitudinal management.  Clinical Impression:  1. Medication refill      Discharge   Final Clinical Impression(s) / ED Diagnoses Final diagnoses:  Medication refill    Rx / DC Orders ED Discharge Orders     None         Jerral Meth, MD 07/24/24 [id number]

## 2024-07-24 NOTE — ED Notes (Signed)
Patient refused d/c vital signs.

## 2024-07-26 ENCOUNTER — Other Ambulatory Visit: Payer: Self-pay

## 2024-07-26 ENCOUNTER — Encounter (HOSPITAL_COMMUNITY): Payer: Self-pay

## 2024-07-26 ENCOUNTER — Emergency Department (HOSPITAL_COMMUNITY)
Admission: EM | Admit: 2024-07-26 | Discharge: 2024-07-26 | Disposition: A | Discharge status: Home / Self Care | Payer: Self-pay | Attending: Emergency Medicine | Admitting: Emergency Medicine

## 2024-07-26 DIAGNOSIS — Z760 Encounter for issue of repeat prescription: Secondary | ICD-10-CM | POA: Insufficient documentation

## 2024-07-26 MED ORDER — BACITRACIN-NEOMYCIN-POLYMYXIN OINTMENT TUBE
TOPICAL_OINTMENT | Freq: Two times a day (BID) | CUTANEOUS | Status: DC
  Filled 2024-07-26: qty 14

## 2024-07-26 MED ORDER — RISPERIDONE 1 MG PO TABS
1.0000 mg | ORAL_TABLET | Freq: Once | ORAL | Status: AC
  Administered 2024-07-26: 1 mg via ORAL
  Filled 2024-07-26: qty 1

## 2024-07-26 NOTE — ED Provider Notes (Signed)
 [place] EMERGENCY DEPARTMENT AT [facility] Provider Note   CSN: [id number] Arrival date & time: 07/26/24  0445     Patient presents with: Medication Refill   Cole Barrett is a 38 y.o. male.   The history is provided by the patient and medical records.  Medication Refill  38 year old male presenting to the ED requesting Risperdal  and some antibiotic cream.  He is well-known to the ED for same.  States he has a minor scratch on his arm and just wants to put some ointment on this.  He has not had his Risperdal  in the past few days since being seen in the ED.  Prior to Admission medications   Medication Sig Start Date End Date Taking? Authorizing Provider  acetaminophen  (TYLENOL  8 HOUR) 650 MG CR tablet Take 1 tablet (650 mg total) by mouth every 8 (eight) hours as needed for pain. 02/14/23   Aberman, Caroline C, PA-C  ibuprofen  (ADVIL ) 600 MG tablet Take 1 tablet (600 mg total) by mouth every 6 (six) hours as needed. Patient taking differently: Take 600 mg by mouth every 6 (six) hours as needed for mild pain. 12/24/22   Harris, Abigail, PA-C  mupirocin  cream (BACTROBAN ) 2 % Apply 1 Application topically 2 (two) times daily. 06/03/24   Palumbo, April, MD  naproxen  (NAPROSYN ) 375 MG tablet Take 1 tablet (375 mg total) by mouth 2 (two) times daily. 02/14/23   Kommor, Madison, MD  risperiDONE  (RISPERDAL ) 1 MG tablet Take 1 tablet (1 mg total) by mouth 2 (two) times daily. 07/14/24   Keith, Kayla N, PA-C  amantadine  (SYMMETREL ) 100 MG capsule Take 1 capsule (100 mg total) by mouth 2 (two) times daily. Patient not taking: Reported on 11/04/2019 09/01/19 11/17/19  [name], NP    Allergies: Patient has no known allergies.    Review of Systems  Constitutional:        Needs medications  All other systems reviewed and are negative.   Updated Vital Signs BP 124/85 (BP Location: Right Arm)   Pulse 65   Temp 98.3 F (36.8 C) (Oral)   Resp 16   Ht 5' 10 (1.778 m)    Wt 99.8 kg   SpO2 100%   BMI 31.57 kg/m   Physical Exam Vitals and nursing note reviewed.  Constitutional:      Appearance: He is well-developed.  HENT:     Head: Normocephalic and atraumatic.  Eyes:     Conjunctiva/sclera: Conjunctivae normal.     Pupils: Pupils are equal, round, and reactive to light.  Cardiovascular:     Rate and Rhythm: Normal rate and regular rhythm.     Heart sounds: Normal heart sounds.  Pulmonary:     Effort: Pulmonary effort is normal.     Breath sounds: Normal breath sounds.  Musculoskeletal:        General: Normal range of motion.     Cervical back: Normal range of motion.  Skin:    General: Skin is warm and dry.  Neurological:     Mental Status: He is alert and oriented to person, place, and time.     (all labs ordered are listed, but only abnormal results are displayed) Labs Reviewed - No data to display  EKG: None  Radiology: No results found.   Procedures   Medications Ordered in the ED  neomycin -bacitracin -polymyxin (NEOSPORIN) ointment ( Topical Given 07/26/24 0559)  risperiDONE  (RISPERDAL ) tablet 1 mg (1 mg Oral Given 07/26/24 0558)  Medical Decision Making Risk Prescription drug management.   53 old male presenting to the ED requesting dose of his Risperdal  and some antibiotic cream for scratch on his arm.  He is well-known to the ED for same.  Has no acute physical complaints currently.  Medications given as requested.  Appears stable for discharge.  Can return here for new concerns.  Final diagnoses:  Medication refill    ED Discharge Orders     None          Jarold Olam CHRISTELLA DEVONNA 07/26/24 9378    Carita Senior, MD 07/26/24 1005

## 2024-07-26 NOTE — ED Triage Notes (Signed)
 Pt in requesting dose of Risperdal  medication. Also requesting Hydrocortisone  cream for a scratch on his arm

## 2024-08-06 ENCOUNTER — Emergency Department (HOSPITAL_COMMUNITY)
Admission: EM | Admit: 2024-08-06 | Discharge: 2024-08-07 | Disposition: A | Discharge status: Home / Self Care | Payer: Self-pay | Attending: Emergency Medicine | Admitting: Emergency Medicine

## 2024-08-06 ENCOUNTER — Other Ambulatory Visit: Payer: Self-pay

## 2024-08-06 DIAGNOSIS — Z7689 Persons encountering health services in other specified circumstances: Secondary | ICD-10-CM | POA: Insufficient documentation

## 2024-08-06 NOTE — ED Triage Notes (Signed)
 Patient here for medication refill, seen recently for same.

## 2024-08-07 MED ORDER — RISPERIDONE 1 MG PO TABS
1.0000 mg | ORAL_TABLET | Freq: Two times a day (BID) | ORAL | Status: DC
  Administered 2024-08-07: 1 mg via ORAL
  Filled 2024-08-07 (×2): qty 1

## 2024-08-07 NOTE — ED Provider Notes (Signed)
 [place] EMERGENCY DEPARTMENT AT [facility] Provider Note   CSN: [id number] Arrival date & time: 08/06/24  2205     Patient presents with: Medication Refill   Cole Barrett is a 38 y.o. male.   38 yo male presents to the ED with a c/o of medication refill. Pt has a history of schizophrenia for which he takes risperidone  1 mg BID. Pt states last dose was last night. Medication is effectively managing pt's hallucinations. Pt does not report any other symptoms at this time. Pt denies anxiety, depression, suicidal ideation, active AVH, or pain.  The history is provided by the patient. No language interpreter was used.  Medication Refill      Prior to Admission medications   Medication Sig Start Date End Date Taking? Authorizing Provider  acetaminophen  (TYLENOL  8 HOUR) 650 MG CR tablet Take 1 tablet (650 mg total) by mouth every 8 (eight) hours as needed for pain. 02/14/23   Aberman, Caroline C, PA-C  ibuprofen  (ADVIL ) 600 MG tablet Take 1 tablet (600 mg total) by mouth every 6 (six) hours as needed. Patient taking differently: Take 600 mg by mouth every 6 (six) hours as needed for mild pain. 12/24/22   Harris, Abigail, PA-C  mupirocin  cream (BACTROBAN ) 2 % Apply 1 Application topically 2 (two) times daily. 06/03/24   Palumbo, April, MD  naproxen  (NAPROSYN ) 375 MG tablet Take 1 tablet (375 mg total) by mouth 2 (two) times daily. 02/14/23   Kommor, Madison, MD  risperiDONE  (RISPERDAL ) 1 MG tablet Take 1 tablet (1 mg total) by mouth 2 (two) times daily. 07/14/24   Keith, Kayla N, PA-C  amantadine  (SYMMETREL ) 100 MG capsule Take 1 capsule (100 mg total) by mouth 2 (two) times daily. Patient not taking: Reported on 11/04/2019 09/01/19 11/17/19  [name], NP    Allergies: Patient has no known allergies.    Review of Systems Ten systems reviewed and are negative for acute change, except as noted in the HPI.    Updated Vital Signs BP 129/71 (BP Location: Right Arm)    Pulse (!) 101   Temp 98.3 F (36.8 C)   Resp 16   SpO2 97%   Physical Exam Vitals and nursing note reviewed.  Constitutional:      General: He is not in acute distress.    Appearance: He is well-developed. He is not diaphoretic.     Comments: Sleeping on initial assessment  HENT:     Head: Normocephalic and atraumatic.  Eyes:     General: No scleral icterus.    Conjunctiva/sclera: Conjunctivae normal.  Pulmonary:     Effort: Pulmonary effort is normal. No respiratory distress.  Musculoskeletal:        General: Normal range of motion.     Cervical back: Normal range of motion.  Skin:    General: Skin is warm and dry.     Coloration: Skin is not pale.     Findings: No erythema or rash.  Neurological:     Mental Status: He is alert and oriented to person, place, and time.  Psychiatric:        Speech: Speech normal.        Behavior: Behavior is cooperative.     Comments: Not reacting to internal stimuli.     (all labs ordered are listed, but only abnormal results are displayed) Labs Reviewed - No data to display  EKG: None  Radiology: No results found.   Procedures   Medications Ordered  in the ED  risperiDONE  (RISPERDAL ) tablet 1 mg (has no administration in time range)                                    Medical Decision Making Risk Prescription drug management.   This patient presents to the ED for concern of needing his medication, this involves an extensive number of treatment options, and is a complaint that carries with it a high risk of complications and morbidity.  The differential diagnosis includes psychosis   Co morbidities that complicate the patient evaluation  HLD Paranoid behavior Schizophrenia Homelessness   Cardiac Monitoring:  The patient was maintained on a cardiac monitor.  I personally viewed and interpreted the cardiac monitored which showed an underlying rhythm of: NSR   Medicines ordered and prescription drug  management:  I ordered medication including Risperdal  for medication adherence  Reevaluation of the patient after these medicines showed that the patient stayed the same I have reviewed the patients home medicines and have made adjustments as needed   Test Considered:  UDS   Problem List / ED Course:  Appears at known baseline. Given dose of Risperdal . No SI/HI. No concern for harm to self. Encouraged outpatient f/u with psychiatric providers.   Reevaluation:  After the interventions noted above, I reevaluated the patient and found that they have :stayed the same   Social Determinants of Health:  Housing instability   Dispostion:  After consideration of the diagnostic results and the patients response to treatment, I feel that the patent would benefit from outpatient f/u with psychiatric provider. Return precautions discussed and provided. Patient discharged in stable condition with no unaddressed concerns.       Final diagnoses:  Encounter for medication administration    ED Discharge Orders     None          Keith Sor, PA-C 08/07/24 0340    Raford Lenis, MD 08/07/24 [phone]

## 2024-08-08 ENCOUNTER — Emergency Department (HOSPITAL_COMMUNITY)
Admission: EM | Admit: 2024-08-08 | Discharge: 2024-08-09 | Disposition: A | Discharge status: Home / Self Care | Payer: Self-pay | Attending: Emergency Medicine | Admitting: Emergency Medicine

## 2024-08-08 DIAGNOSIS — Z760 Encounter for issue of repeat prescription: Secondary | ICD-10-CM | POA: Insufficient documentation

## 2024-08-08 NOTE — ED Triage Notes (Signed)
 Pt requesting dose of risperdal ; currently takes 3mg  per patient

## 2024-08-08 NOTE — ED Triage Notes (Signed)
 Pt states he has no complaints but just needs his risperidone . States he hasn't taken his meds in a few days. Denies any complaints at time of assessment.

## 2024-08-09 MED ORDER — RISPERIDONE 3 MG PO TABS
3.0000 mg | ORAL_TABLET | ORAL | Status: AC
  Administered 2024-08-09: 3 mg via ORAL
  Filled 2024-08-09: qty 1

## 2024-08-09 NOTE — ED Provider Notes (Signed)
 [place] EMERGENCY DEPARTMENT AT [facility] Provider Note   CSN: [id number] Arrival date & time: 08/08/24  2249     Patient presents with: Medication Refill   Cole Barrett is a 38 y.o. male.   Patient presents requesting dose of risperidone .  States that he missed his dose.  States he has access to more medications when he leaves here.  Has no other complaints at this time       Prior to Admission medications   Medication Sig Start Date End Date Taking? Authorizing Provider  acetaminophen  (TYLENOL  8 HOUR) 650 MG CR tablet Take 1 tablet (650 mg total) by mouth every 8 (eight) hours as needed for pain. 02/14/23   Aberman, Caroline C, PA-C  ibuprofen  (ADVIL ) 600 MG tablet Take 1 tablet (600 mg total) by mouth every 6 (six) hours as needed. Patient taking differently: Take 600 mg by mouth every 6 (six) hours as needed for mild pain. 12/24/22   Harris, Abigail, PA-C  mupirocin  cream (BACTROBAN ) 2 % Apply 1 Application topically 2 (two) times daily. 06/03/24   Palumbo, April, MD  naproxen  (NAPROSYN ) 375 MG tablet Take 1 tablet (375 mg total) by mouth 2 (two) times daily. 02/14/23   Kommor, Madison, MD  risperiDONE  (RISPERDAL ) 1 MG tablet Take 1 tablet (1 mg total) by mouth 2 (two) times daily. 07/14/24   Keith, Kayla N, PA-C  amantadine  (SYMMETREL ) 100 MG capsule Take 1 capsule (100 mg total) by mouth 2 (two) times daily. Patient not taking: Reported on 11/04/2019 09/01/19 11/17/19  [name], NP    Allergies: Patient has no known allergies.    Review of Systems  All other systems reviewed and are negative.   Updated Vital Signs BP 130/78 (BP Location: Left Arm)   Pulse 73   Temp 97.7 F (36.5 C)   Resp 16   SpO2 100%   Physical Exam Vitals and nursing note reviewed.  Constitutional:      General: He is not in acute distress.    Appearance: Normal appearance. He is well-developed. He is not toxic-appearing.  HENT:     Head: Normocephalic and  atraumatic.  Eyes:     General: Lids are normal.     Conjunctiva/sclera: Conjunctivae normal.     Pupils: Pupils are equal, round, and reactive to light.  Neck:     Thyroid : No thyroid  mass.     Trachea: No tracheal deviation.  Cardiovascular:     Rate and Rhythm: Normal rate and regular rhythm.     Heart sounds: Normal heart sounds. No murmur heard.    No gallop.  Pulmonary:     Effort: Pulmonary effort is normal. No respiratory distress.     Breath sounds: Normal breath sounds. No stridor. No decreased breath sounds, wheezing, rhonchi or rales.  Abdominal:     General: There is no distension.     Palpations: Abdomen is soft.     Tenderness: There is no abdominal tenderness. There is no rebound.  Musculoskeletal:        General: No tenderness. Normal range of motion.     Cervical back: Normal range of motion and neck supple.  Skin:    General: Skin is warm and dry.     Findings: No abrasion or rash.  Neurological:     Mental Status: He is alert and oriented to person, place, and time. Mental status is at baseline.     GCS: GCS eye subscore is 4. GCS verbal  subscore is 5. GCS motor subscore is 6.     Cranial Nerves: No cranial nerve deficit.     Sensory: No sensory deficit.     Motor: Motor function is intact.  Psychiatric:        Attention and Perception: Attention normal.        Speech: Speech normal.        Behavior: Behavior normal.     (all labs ordered are listed, but only abnormal results are displayed) Labs Reviewed - No data to display  EKG: None  Radiology: No results found.   Procedures   Medications Ordered in the ED  risperiDONE  (RISPERDAL ) tablet 3 mg (has no administration in time range)                                    Medical Decision Making Risk Prescription drug management.   Patient given dose of risperidone  here.  Instructed to follow-up with his doctor     Final diagnoses:  None    ED Discharge Orders     None           Dasie Faden, MD 08/09/24 [phone]

## 2024-08-09 NOTE — Discharge Instructions (Signed)
 Follow-up with your doctor as needed.

## 2024-08-18 ENCOUNTER — Emergency Department (HOSPITAL_COMMUNITY)
Admission: EM | Admit: 2024-08-18 | Discharge: 2024-08-18 | Disposition: A | Discharge status: Home / Self Care | Payer: Self-pay | Attending: Emergency Medicine | Admitting: Emergency Medicine

## 2024-08-18 ENCOUNTER — Encounter (HOSPITAL_COMMUNITY): Payer: Self-pay

## 2024-08-18 ENCOUNTER — Other Ambulatory Visit: Payer: Self-pay

## 2024-08-18 DIAGNOSIS — Z5900 Homelessness unspecified: Secondary | ICD-10-CM | POA: Insufficient documentation

## 2024-08-18 DIAGNOSIS — Z760 Encounter for issue of repeat prescription: Secondary | ICD-10-CM | POA: Insufficient documentation

## 2024-08-18 MED ORDER — RISPERIDONE 1 MG PO TABS
1.0000 mg | ORAL_TABLET | Freq: Once | ORAL | Status: AC
  Administered 2024-08-18: 1 mg via ORAL
  Filled 2024-08-18: qty 1

## 2024-08-18 NOTE — ED Triage Notes (Signed)
 Pt arrived POV requesting his dose of Risperdal .

## 2024-08-18 NOTE — ED Provider Notes (Signed)
 [place] EMERGENCY DEPARTMENT AT [facility] Provider Note   CSN: [id number] Arrival date & time: 08/18/24  0403     Patient presents with: Medication Refill   Cole Barrett is a 38 y.o. male with history of hallucinations, homelessness, malingering, paranoid behavior, schizophrenia.  Patient presents to ED for evaluation of medication refill.  Patient here requesting Risperdal  tablet.  Patient denies SI, HI, AVH.  Denies medical complaints.  Not responding to internal stimuli.  Reports that we can take our time so I can rest my head.   Medication Refill      Prior to Admission medications   Medication Sig Start Date End Date Taking? Authorizing Provider  acetaminophen  (TYLENOL  8 HOUR) 650 MG CR tablet Take 1 tablet (650 mg total) by mouth every 8 (eight) hours as needed for pain. 02/14/23   Aberman, Caroline C, PA-C  ibuprofen  (ADVIL ) 600 MG tablet Take 1 tablet (600 mg total) by mouth every 6 (six) hours as needed. Patient taking differently: Take 600 mg by mouth every 6 (six) hours as needed for mild pain. 12/24/22   Harris, Abigail, PA-C  mupirocin  cream (BACTROBAN ) 2 % Apply 1 Application topically 2 (two) times daily. 06/03/24   Palumbo, April, MD  naproxen  (NAPROSYN ) 375 MG tablet Take 1 tablet (375 mg total) by mouth 2 (two) times daily. 02/14/23   Kommor, Madison, MD  risperiDONE  (RISPERDAL ) 1 MG tablet Take 1 tablet (1 mg total) by mouth 2 (two) times daily. 07/14/24   Keith, Kayla N, PA-C  amantadine  (SYMMETREL ) 100 MG capsule Take 1 capsule (100 mg total) by mouth 2 (two) times daily. Patient not taking: Reported on 11/04/2019 09/01/19 11/17/19  [name], NP    Allergies: Patient has no known allergies.    Review of Systems  All other systems reviewed and are negative.   Updated Vital Signs BP 129/84 (BP Location: Right Arm)   Pulse 73   Temp 98.1 F (36.7 C)   Resp 20   SpO2 99%   Physical Exam Vitals and nursing note reviewed.   Constitutional:      General: He is not in acute distress.    Appearance: He is not toxic-appearing.  HENT:     Head: Normocephalic and atraumatic.  Pulmonary:     Effort: No respiratory distress.  Skin:    Coloration: Skin is not jaundiced or pale.  Neurological:     Mental Status: He is alert and oriented to person, place, and time.  Psychiatric:        Behavior: Behavior normal.     Comments: Not responding to internal stimuli. Calm and cooperative.     (all labs ordered are listed, but only abnormal results are displayed) Labs Reviewed - No data to display  EKG: None  Radiology: No results found.  Procedures   Medications Ordered in the ED  risperiDONE  (RISPERDAL ) tablet 1 mg (has no administration in time range)    Medical Decision Making  38 year old presenting for respite all tablet.  He denies any medical complaints.  He is not responding to internal stimuli.  He is calm and cooperative.  He denies SI, HI, AVH.  Patient here is afebrile and nontachycardic.  His lung sounds are clear bilaterally, he is not hypoxic.  Abdomen is soft and compressible.  Neurological examination at baseline.  Overall nontoxic in appearance.  Patient given respite all tablet.  Patient continuously stating that we can take our time so that he can rest my  head.  Patient given Risperdal  tablet at this time.  Discharged in stable condition.  Given resources for housing as well as information for behavioral urgent care.    Final diagnoses:  Medication refill    ED Discharge Orders     None          Butler, Vegh 08/18/24 0602    Mesner, Selinda, MD 08/18/24 [phone]

## 2024-08-18 NOTE — ED Notes (Signed)
 Pt was sitting eating nachos in the lobby and then disappeared.

## 2024-08-18 NOTE — Discharge Instructions (Addendum)
 Follow-up with the behavior health urgent care.  Please review attached guide concerning shelter resources.  Return to ED with new symptoms.

## 2024-08-20 ENCOUNTER — Emergency Department (HOSPITAL_COMMUNITY)
Admission: EM | Admit: 2024-08-20 | Discharge: 2024-08-21 | Disposition: A | Discharge status: Home / Self Care | Payer: Self-pay | Attending: Emergency Medicine | Admitting: Emergency Medicine

## 2024-08-20 ENCOUNTER — Other Ambulatory Visit: Payer: Self-pay

## 2024-08-20 ENCOUNTER — Encounter (HOSPITAL_COMMUNITY): Payer: Self-pay | Admitting: Emergency Medicine

## 2024-08-20 DIAGNOSIS — Z91138 Patient's unintentional underdosing of medication regimen for other reason: Secondary | ICD-10-CM | POA: Insufficient documentation

## 2024-08-20 DIAGNOSIS — T43596A Underdosing of other antipsychotics and neuroleptics, initial encounter: Secondary | ICD-10-CM | POA: Insufficient documentation

## 2024-08-20 NOTE — ED Notes (Signed)
Pt called x 2 

## 2024-08-20 NOTE — ED Triage Notes (Signed)
 Pt in requesting dose of Risperdal  medication

## 2024-08-21 MED ORDER — RISPERIDONE 1 MG PO TABS
1.0000 mg | ORAL_TABLET | Freq: Once | ORAL | Status: AC
  Administered 2024-08-21: 1 mg via ORAL
  Filled 2024-08-21: qty 1

## 2024-08-21 NOTE — Discharge Instructions (Signed)
 We were able to give you the dose of medication that you report that you missed.  As you had no other complaints or concerns we will discharge you to continue outpatient medication regimen and management.  Please continue taking your home medications and follow-up with your primary team.

## 2024-08-21 NOTE — ED Provider Notes (Signed)
 [place] EMERGENCY DEPARTMENT AT [facility] Provider Note   CSN: [id number] Arrival date & time: 08/20/24  2219     Patient presents with: No chief complaint on file.   Cole Barrett is a 38 y.o. male.   The history is provided by the patient and medical records. No language interpreter was used.  Medication Refill Medications/supplies requested:  1 today dose of Risperdal  Reason for request:  Medications not available Medications taken before: yes - see home medications        Prior to Admission medications   Medication Sig Start Date End Date Taking? Authorizing Provider  acetaminophen  (TYLENOL  8 HOUR) 650 MG CR tablet Take 1 tablet (650 mg total) by mouth every 8 (eight) hours as needed for pain. 02/14/23   Aberman, Caroline C, PA-C  ibuprofen  (ADVIL ) 600 MG tablet Take 1 tablet (600 mg total) by mouth every 6 (six) hours as needed. Patient taking differently: Take 600 mg by mouth every 6 (six) hours as needed for mild pain. 12/24/22   Harris, Abigail, PA-C  mupirocin  cream (BACTROBAN ) 2 % Apply 1 Application topically 2 (two) times daily. 06/03/24   Palumbo, April, MD  naproxen  (NAPROSYN ) 375 MG tablet Take 1 tablet (375 mg total) by mouth 2 (two) times daily. 02/14/23   Kommor, Madison, MD  risperiDONE  (RISPERDAL ) 1 MG tablet Take 1 tablet (1 mg total) by mouth 2 (two) times daily. 07/14/24   Keith, Kayla N, PA-C  amantadine  (SYMMETREL ) 100 MG capsule Take 1 capsule (100 mg total) by mouth 2 (two) times daily. Patient not taking: Reported on 11/04/2019 09/01/19 11/17/19  [name], NP    Allergies: Patient has no known allergies.    Review of Systems  Constitutional:  Negative for chills, fatigue and fever.  HENT:  Negative for congestion, ear pain and sore throat.   Eyes:  Negative for pain and visual disturbance.  Respiratory:  Negative for cough, chest tightness, shortness of breath and wheezing.   Cardiovascular:  Negative for chest pain and  palpitations.  Gastrointestinal:  Negative for abdominal pain, constipation, diarrhea, nausea and vomiting.  Genitourinary:  Negative for dysuria and hematuria.  Musculoskeletal:  Negative for arthralgias and back pain.  Skin:  Negative for color change and rash.  Neurological:  Negative for seizures, syncope and headaches.  Psychiatric/Behavioral:  Negative for agitation, confusion and hallucinations.   All other systems reviewed and are negative.   Updated Vital Signs BP 130/74 (BP Location: Right Arm)   Pulse 79   Temp 97.8 F (36.6 C)   Resp 18   Wt 99.8 kg   SpO2 99%   BMI 31.57 kg/m   Physical Exam Vitals and nursing note reviewed.  Constitutional:      General: Cole Barrett is not in acute distress.    Appearance: Cole Barrett is well-developed. Cole Barrett is not ill-appearing, toxic-appearing or diaphoretic.  HENT:     Head: Normocephalic and atraumatic.     Nose: Nose normal.     Mouth/Throat:     Mouth: Mucous membranes are moist.  Eyes:     Conjunctiva/sclera: Conjunctivae normal.  Cardiovascular:     Rate and Rhythm: Normal rate and regular rhythm.     Heart sounds: No murmur heard. Pulmonary:     Effort: Pulmonary effort is normal. No respiratory distress.     Breath sounds: Normal breath sounds. No wheezing, rhonchi or rales.  Chest:     Chest wall: No tenderness.  Abdominal:  Palpations: Abdomen is soft.     Tenderness: There is no abdominal tenderness.  Musculoskeletal:        General: No swelling.     Cervical back: Neck supple.  Skin:    General: Skin is warm and dry.     Capillary Refill: Capillary refill takes less than 2 seconds.  Neurological:     General: No focal deficit present.     Mental Status: Cole Barrett is alert.  Psychiatric:        Mood and Affect: Mood normal.     (all labs ordered are listed, but only abnormal results are displayed) Labs Reviewed - No data to display  EKG: None  Radiology: No results found.   Procedures   Medications Ordered in  the ED  risperiDONE  (RISPERDAL ) tablet 1 mg (has no administration in time range)                                    Medical Decision Making   Cole Barrett is a 38 y.o. male with a past medical history significant for hypercholesterolemia, schizoaffective disorder, and previous pulmonary embolism who presents for missing a dose of medication.  According to patient, Cole Barrett takes Risperdal  1 mg twice a day but was unable to take it overnight.  Cole Barrett is requesting a dose.  Cole Barrett otherwise states Cole Barrett has a prescription at home and has plenty medication but is not having it with him now.  Cole Barrett says that Cole Barrett has no other complaints and specifically denies any SI, HI, hallucinations, paranoia.  Cole Barrett denies any medical problems either denying any fevers, chills, congestion, cough, nausea, vomiting, constipation, diarrhea, or urinary changes.  Cole Barrett just wants his dose of medication.  On exam, lungs clear and chest nontender.  Abdomen nontender.  Patient sitting comfortably resting without distress.  Given the patient's request, we will give him a dose of his medication and we will discharge so Cole Barrett continued to his outpatient management at home.  Cole Barrett does not want any further workup and has no other complaints.  We will discharge for outpatient follow-up after Cole Barrett gets his dose of Risperdal  this morning.      Final diagnoses:  Medication dose missed    ED Discharge Orders     None      Clinical Impression: 1. Medication dose missed     Disposition: Discharge  Condition: Good  I have discussed the results, Dx and Tx plan with the pt(& family if present). Cole Barrett/she/they expressed understanding and agree(s) with the plan. Discharge instructions discussed at great length. Strict return precautions discussed and pt &/or family have verbalized understanding of the instructions. No further questions at time of discharge.    New Prescriptions   No medications on file    Follow Up: [facility] [address] [place] [address] [place]  [id number] [phone] Schedule an appointment as soon as possible for a visit    [facility] Emergency Department at [facility] [address] [place]  [address] [phone]        Banks Chaikin, Lonni PARAS, MD 08/21/24 [phone]

## 2024-08-24 ENCOUNTER — Emergency Department (HOSPITAL_COMMUNITY)
Admission: EM | Admit: 2024-08-24 | Discharge: 2024-08-24 | Disposition: A | Discharge status: Home / Self Care | Payer: Self-pay | Attending: Emergency Medicine | Admitting: Emergency Medicine

## 2024-08-24 ENCOUNTER — Other Ambulatory Visit: Payer: Self-pay

## 2024-08-24 ENCOUNTER — Encounter (HOSPITAL_COMMUNITY): Payer: Self-pay | Admitting: *Deleted

## 2024-08-24 DIAGNOSIS — Z760 Encounter for issue of repeat prescription: Secondary | ICD-10-CM | POA: Insufficient documentation

## 2024-08-24 DIAGNOSIS — Z91138 Patient's unintentional underdosing of medication regimen for other reason: Secondary | ICD-10-CM

## 2024-08-24 DIAGNOSIS — T43596A Underdosing of other antipsychotics and neuroleptics, initial encounter: Secondary | ICD-10-CM | POA: Insufficient documentation

## 2024-08-24 MED ORDER — RISPERIDONE 1 MG PO TABS
1.0000 mg | ORAL_TABLET | Freq: Once | ORAL | Status: AC
  Administered 2024-08-24: 1 mg via ORAL
  Filled 2024-08-24: qty 1

## 2024-08-24 NOTE — ED Provider Notes (Signed)
 [place] EMERGENCY DEPARTMENT AT [facility] Provider Note   CSN: [id number] Arrival date & time: 08/24/24  2040     Patient presents with: Medication Refill   Jarmon Javid is a 38 y.o. male.   38 year old male presents today for missing his Risperdal  dose.  He is well-known to this emergency department and has had 25 visits in the past 6 months.  He also states that he does not have a place to stay and for this provider to take your time I am just going to rest a while.  States he does not have a bed.  Denies any hallucinations or other concerns.  The history is provided by the patient. No language interpreter was used.       Prior to Admission medications   Medication Sig Start Date End Date Taking? Authorizing Provider  acetaminophen  (TYLENOL  8 HOUR) 650 MG CR tablet Take 1 tablet (650 mg total) by mouth every 8 (eight) hours as needed for pain. 02/14/23   Aberman, Caroline C, PA-C  ibuprofen  (ADVIL ) 600 MG tablet Take 1 tablet (600 mg total) by mouth every 6 (six) hours as needed. Patient taking differently: Take 600 mg by mouth every 6 (six) hours as needed for mild pain. 12/24/22   Harris, Abigail, PA-C  mupirocin  cream (BACTROBAN ) 2 % Apply 1 Application topically 2 (two) times daily. 06/03/24   Palumbo, April, MD  naproxen  (NAPROSYN ) 375 MG tablet Take 1 tablet (375 mg total) by mouth 2 (two) times daily. 02/14/23   Kommor, Madison, MD  risperiDONE  (RISPERDAL ) 1 MG tablet Take 1 tablet (1 mg total) by mouth 2 (two) times daily. 07/14/24   Keith, Kayla N, PA-C  amantadine  (SYMMETREL ) 100 MG capsule Take 1 capsule (100 mg total) by mouth 2 (two) times daily. Patient not taking: Reported on 11/04/2019 09/01/19 11/17/19  [name], NP    Allergies: Patient has no known allergies.    Review of Systems  Constitutional:  Negative for chills and fever.  Psychiatric/Behavioral:  Negative for hallucinations and suicidal ideas.   All other systems reviewed  and are negative.   Updated Vital Signs BP (!) 147/97 (BP Location: Right Arm)   Pulse 100   Temp 98.8 F (37.1 C)   Resp 19   Ht 5' 10 (1.778 m)   Wt 99.8 kg   SpO2 97%   BMI 31.57 kg/m   Physical Exam Vitals and nursing note reviewed.  Constitutional:      General: He is not in acute distress.    Appearance: Normal appearance. He is not ill-appearing.  HENT:     Head: Normocephalic and atraumatic.     Nose: Nose normal.  Eyes:     Conjunctiva/sclera: Conjunctivae normal.  Cardiovascular:     Rate and Rhythm: Normal rate and regular rhythm.  Pulmonary:     Effort: Pulmonary effort is normal. No respiratory distress.  Musculoskeletal:        General: No deformity. Normal range of motion.     Cervical back: Normal range of motion.  Skin:    Findings: No rash.  Neurological:     Mental Status: He is alert.  Psychiatric:        Thought Content: Thought content does not include homicidal or suicidal ideation. Thought content does not include suicidal plan.     (all labs ordered are listed, but only abnormal results are displayed) Labs Reviewed - No data to display  EKG: None  Radiology: No results  found.   Procedures   Medications Ordered in the ED  risperiDONE  (RISPERDAL ) tablet 1 mg (has no administration in time range)                                    Medical Decision Making Risk Prescription drug management.   38 year old male presents today for dose of his Risperdal  medication.  He states he has not had this in a couple days.  Denies any SI, HI, or hallucinations.  Patient is requesting some time to rest in the emergency department as he does not have a bed.  No complaints.  He is stable for discharge.  Discharged in stable condition.   Final diagnoses:  Medication dose missed    ED Discharge Orders     None          Hildegard Loge, PA-C 08/24/24 2243    Cottie Donnice PARAS, MD 08/24/24 2255

## 2024-08-24 NOTE — ED Triage Notes (Signed)
 Pt states he would like a prescription for Risperdal .  States he ran out several days ago.

## 2024-08-24 NOTE — Discharge Instructions (Addendum)
 You were given a dose of Risperdal .  Continue taking them as prescribed at home.  Return for any emergent symptoms.

## 2024-08-24 NOTE — ED Notes (Signed)
 Waiting for risperidone  to be sent by pharmacy.

## 2024-08-28 ENCOUNTER — Emergency Department (HOSPITAL_COMMUNITY)
Admission: EM | Admit: 2024-08-28 | Discharge: 2024-08-28 | Disposition: A | Discharge status: Home / Self Care | Payer: Self-pay | Attending: Emergency Medicine | Admitting: Emergency Medicine

## 2024-08-28 ENCOUNTER — Encounter (HOSPITAL_COMMUNITY): Payer: Self-pay | Admitting: Emergency Medicine

## 2024-08-28 ENCOUNTER — Other Ambulatory Visit: Payer: Self-pay

## 2024-08-28 DIAGNOSIS — Z760 Encounter for issue of repeat prescription: Secondary | ICD-10-CM | POA: Insufficient documentation

## 2024-08-28 MED ORDER — RISPERIDONE 1 MG PO TABS
1.0000 mg | ORAL_TABLET | Freq: Two times a day (BID) | ORAL | Status: DC
  Administered 2024-08-28: 1 mg via ORAL
  Filled 2024-08-28: qty 1

## 2024-08-28 MED ORDER — RISPERIDONE 1 MG PO TABS: 1.0000 mg | ORAL_TABLET | Freq: Two times a day (BID) | ORAL | 0 refills | Status: DC

## 2024-08-28 MED ORDER — MUPIROCIN CALCIUM 2 % EX CREA: 1.0000 | TOPICAL_CREAM | Freq: Two times a day (BID) | CUTANEOUS | 0 refills | Status: DC

## 2024-08-28 MED ORDER — MUPIROCIN CALCIUM 2 % EX CREA
TOPICAL_CREAM | Freq: Once | CUTANEOUS | Status: AC
  Filled 2024-08-28: qty 15

## 2024-08-28 NOTE — ED Provider Notes (Signed)
 [place] EMERGENCY DEPARTMENT AT [facility] Provider Note   CSN: [id number] Arrival date & time: 08/28/24  9561     Patient presents with: Medication Refill   Cole Barrett is a 38 y.o. male.   Medication Refill Patient is a 38 year old male presenting ED today for requesting Risperdal .  Notes that he has missed last couple of days and came in today to get a dose.  Also requesting some antibiotic cream for small lesion to right arm.  Notes that he is using this for the small lesion which was self affected from itching.  Denies any other problems with no other complaints.  Says that he is doing just fine,  chilling.  Denies fever, headache, vision changes, chest pain, shortness of breath, abdominal pain, nausea or vomiting, diarrhea, visual/auditory hallucinations, SI, HI.     Prior to Admission medications   Medication Sig Start Date End Date Taking? Authorizing Provider  acetaminophen  (TYLENOL  8 HOUR) 650 MG CR tablet Take 1 tablet (650 mg total) by mouth every 8 (eight) hours as needed for pain. 02/14/23   Aberman, Caroline C, PA-C  ibuprofen  (ADVIL ) 600 MG tablet Take 1 tablet (600 mg total) by mouth every 6 (six) hours as needed. Patient taking differently: Take 600 mg by mouth every 6 (six) hours as needed for mild pain. 12/24/22   Harris, Abigail, PA-C  mupirocin  cream (BACTROBAN ) 2 % Apply 1 Application topically 2 (two) times daily. 08/28/24   Leonardo Plaia S, PA-C  naproxen  (NAPROSYN ) 375 MG tablet Take 1 tablet (375 mg total) by mouth 2 (two) times daily. 02/14/23   Kommor, Madison, MD  risperiDONE  (RISPERDAL ) 1 MG tablet Take 1 tablet (1 mg total) by mouth 2 (two) times daily. 08/28/24   Aisley Whan S, PA-C  amantadine  (SYMMETREL ) 100 MG capsule Take 1 capsule (100 mg total) by mouth 2 (two) times daily. Patient not taking: Reported on 11/04/2019 09/01/19 11/17/19  [name], NP    Allergies: Patient has no known allergies.    Review of  Systems  Skin:  Positive for wound.  All other systems reviewed and are negative.   Updated Vital Signs BP 125/74 (BP Location: Right Arm)   Pulse 71   Temp 98.2 F (36.8 C)   Resp 16   SpO2 97%   Physical Exam Vitals and nursing note reviewed.  Constitutional:      General: He is not in acute distress.    Appearance: Normal appearance. He is not ill-appearing or diaphoretic.  HENT:     Head: Normocephalic and atraumatic.  Eyes:     General: No scleral icterus.       Right eye: No discharge.        Left eye: No discharge.     Extraocular Movements: Extraocular movements intact.     Conjunctiva/sclera: Conjunctivae normal.  Cardiovascular:     Rate and Rhythm: Normal rate and regular rhythm.     Pulses: Normal pulses.     Heart sounds: Normal heart sounds. No murmur heard.    No friction rub. No gallop.  Pulmonary:     Effort: Pulmonary effort is normal. No respiratory distress.     Breath sounds: No stridor. No wheezing, rhonchi or rales.  Chest:     Chest wall: No tenderness.  Abdominal:     General: Abdomen is flat. There is no distension.     Palpations: Abdomen is soft.     Tenderness: There is no abdominal tenderness. There  is no right CVA tenderness, left CVA tenderness, guarding or rebound.  Musculoskeletal:        General: No swelling, deformity or signs of injury.     Cervical back: Normal range of motion. No rigidity.     Right lower leg: No edema.     Left lower leg: No edema.  Skin:    General: Skin is warm and dry.     Findings: Lesion (Noted to have small patient, 0.5 cm lesion that appears to be a bug bite that patient had scratched.  Appears to be well-healing.) present. No bruising or erythema.  Neurological:     General: No focal deficit present.     Mental Status: He is alert and oriented to person, place, and time. Mental status is at baseline.     Sensory: No sensory deficit.     Motor: No weakness.  Psychiatric:        Mood and Affect: Mood  normal.     (all labs ordered are listed, but only abnormal results are displayed) Labs Reviewed - No data to display  EKG: None  Radiology: No results found.  Procedures   Medications Ordered in the ED  risperiDONE  (RISPERDAL ) tablet 1 mg (1 mg Oral Given 08/28/24 0834)  mupirocin  cream (BACTROBAN ) 2 % ( Topical Given 08/28/24 0840)      Medical Decision Making  This patient is a 38 year old male who presents to the ED for concern of medication refill for Seroquel and antibiotic Cream.  Denies SI, HI, hallucinations.  On physical exam, patient is in no acute distress, afebrile, alert and orient x 4, speaking in full sentences, nontachypneic, nontachycardic. Unremarkable exam.  Normal, well-appearing presentation.  Noted to have 1 small 0.5 cm lesion that appears to be well-healing at this time.  With patient noted to have history of scratching skin profusely, will send home with some antibiotic cream for lesion prophylactically.  Provided Risperdal  and refill for 2 days as well as they the patient should follow-up with behavioral urgent care for further management.  Patient vital signs have remained stable throughout the course of patient's time in the ED. Low suspicion for any other emergent pathology at this time. I believe this patient is safe to be discharged. Provided strict return to ER precautions. Patient expressed agreement and understanding of plan. All questions were answered.   Differential diagnoses prior to evaluation: The emergent differential diagnosis includes, but is not limited to, medication refill, malingering, SI, HI,. This is not an exhaustive differential.   Past Medical History / Co-morbidities / Social History: Homelessness, PE, malingering, schizophrenia, HLD, PE, alcohol abuse, cannabis abuse  Additional history: Chart reviewed. Pertinent results include:  Seen on 08/24/2024 for same presentation noting to have missed his Risperdal   dose.   Medications: I ordered medication including Bactroban , Risperdal .  I have reviewed the patients home medicines and have made adjustments as needed.  Critical Interventions: None  Social Determinants of Health: Schizophrenia, homeless  Disposition: After consideration of the diagnostic results and the patients response to treatment, I feel that the patient would benefit from discharge and treatment as above.   emergency department workup does not suggest an emergent condition requiring admission or immediate intervention beyond what has been performed at this time. The plan is: Follow-up with behavioral health urgent care. The patient is safe for discharge and has been instructed to return immediately for worsening symptoms, change in symptoms or any other concerns.   Final diagnoses:  Medication refill  ED Discharge Orders          Ordered    risperiDONE  (RISPERDAL ) 1 MG tablet  2 times daily        08/28/24 0815    mupirocin  cream (BACTROBAN ) 2 %  2 times daily        08/28/24 0815               Beola Terrall RAMAN, PA-C 08/28/24 9155    Armenta Canning, MD 08/29/24 1652

## 2024-08-28 NOTE — ED Triage Notes (Signed)
 Patient here requesting Risperdal  refill, antibiotic cream for rash on bilateral arms and a [place] sandwich. No other complaints at this time.

## 2024-08-28 NOTE — Discharge Instructions (Addendum)
 You were seen today for needing medication refilled.  Use the antibiotic cream over the right forearm lesion.  You can stop using whenever wound is healed.  Also refilled your Risperdal  for the next 5 days.  Recommend you go to the behavioral health urgent care for further management of this as they will be able to further prescribe these medications for you.  Provided instructions on how to get there and address in this at visit summary.

## 2024-09-03 ENCOUNTER — Emergency Department (HOSPITAL_COMMUNITY)
Admission: EM | Admit: 2024-09-03 | Discharge: 2024-09-04 | Disposition: A | Discharge status: Home / Self Care | Payer: Self-pay | Attending: Emergency Medicine | Admitting: Emergency Medicine

## 2024-09-03 ENCOUNTER — Encounter (HOSPITAL_COMMUNITY): Payer: Self-pay

## 2024-09-03 ENCOUNTER — Other Ambulatory Visit: Payer: Self-pay

## 2024-09-03 DIAGNOSIS — Z8616 Personal history of COVID-19: Secondary | ICD-10-CM | POA: Insufficient documentation

## 2024-09-03 DIAGNOSIS — F17210 Nicotine dependence, cigarettes, uncomplicated: Secondary | ICD-10-CM | POA: Insufficient documentation

## 2024-09-03 DIAGNOSIS — Z5900 Homelessness unspecified: Secondary | ICD-10-CM | POA: Insufficient documentation

## 2024-09-03 DIAGNOSIS — Z760 Encounter for issue of repeat prescription: Secondary | ICD-10-CM | POA: Insufficient documentation

## 2024-09-03 MED ORDER — RISPERIDONE 1 MG PO TABS
1.0000 mg | ORAL_TABLET | Freq: Once | ORAL | Status: AC
  Administered 2024-09-03: 1 mg via ORAL
  Filled 2024-09-03: qty 1

## 2024-09-03 NOTE — ED Triage Notes (Signed)
 Patient here today just wanting his Risperdal . States he doesn't have have any on him.

## 2024-09-04 MED ORDER — RISPERIDONE 1 MG PO TABS: 1.0000 mg | ORAL_TABLET | Freq: Two times a day (BID) | ORAL | 0 refills | Status: DC

## 2024-09-04 NOTE — ED Provider Notes (Signed)
 [facility] Emergency Department Provider Note MRN:  [id number]  Arrival date & time: 09/04/24     Chief Complaint   Medication refill History of Present Illness   Cole Barrett is a 38 y.o. year-old male with a history of homelessness, schizophrenia presenting to the ED with chief complaint of medication refill.  Out of his Risperdal , wants a refill.  Denies any bodily complaints.  Review of Systems  A thorough review of systems was obtained and all systems are negative except as noted in the HPI and PMH.   Patient's Health History    Past Medical History:  Diagnosis Date   COVID-19    Hallucination    Homelessness    Hypercholesterolemia    Malingering    Mental disorder    Paranoid behavior (HCC)    PE (pulmonary embolism)    Schizophrenia (HCC)     History reviewed. No pertinent surgical history.  Family History  Problem Relation Age of Onset   Hypertension Mother     Social History   Socioeconomic History   Marital status: Single    Spouse name: Not on file   Number of children: Not on file   Years of education: Not on file   Highest education level: Not on file  Occupational History   Occupation: Employed    Comment: [employer]  Tobacco Use   Smoking status: Every Day    Current packs/day: 0.50    Types: Cigarettes   Smokeless tobacco: Never  Vaping Use   Vaping status: Never Used  Substance and Sexual Activity   Alcohol use: Yes    Comment: occ   Drug use: Not Currently    Types: Marijuana    Comment: Last used: 2 months ago UDS NA   Sexual activity: Never  Other Topics Concern   Not on file  Social History Narrative   ** Merged History Encounter **    Pt lives alone in [place].  He is employed.  He stated that he is followed by [facility], but that he has not been there in a while.   Social Drivers of Corporate investment banker Strain: Not on file  Food Insecurity: Not on file  Transportation Needs:  Not on file  Physical Activity: Not on file  Stress: Not on file  Social Connections: Not on file  Intimate Partner Violence: Not on file     Physical Exam   Vitals:   09/03/24 2303 09/03/24 2307  BP: 112/77   Pulse: 68   Resp: 14   Temp:  98.1 F (36.7 C)  SpO2: 98%     CONSTITUTIONAL: Well-appearing, NAD NEURO/PSYCH:  Alert and oriented x 3, no focal deficits EYES:  eyes equal and reactive ENT/NECK:  no LAD, no JVD CARDIO: Regular rate, well-perfused, normal S1 and S2 PULM:  CTAB no wheezing or rhonchi GI/GU:  non-distended, non-tender MSK/SPINE:  No gross deformities, no edema SKIN:  no rash, atraumatic   *Additional and/or pertinent findings included in MDM below  Diagnostic and Interventional Summary    EKG Interpretation Date/Time:    Ventricular Rate:    PR Interval:    QRS Duration:    QT Interval:    QTC Calculation:   R Axis:      Text Interpretation:         Labs Reviewed - No data to display  No orders to display    Medications  risperiDONE  (RISPERDAL ) tablet 1 mg (1 mg Oral Given  09/03/24 2316)     Procedures  /  Critical Care Procedures  ED Course and Medical Decision Making  Initial Impression and Ddx Medication refill with no other symptoms, normal vitals, well-appearing.  No signs of medical or psychiatric emergency at this time.  Past medical/surgical history that increases complexity of ED encounter: Schizophrenia  Interpretation of Diagnostics Laboratory and/or imaging options to aid in the diagnosis/care of the patient were considered.  After careful history and physical examination, it was determined that there was no indication for diagnostics at this time.  Patient Reassessment and Ultimate Disposition/Management     Discharge  Patient management required discussion with the following services or consulting groups:  None  Complexity of Problems Addressed Acute complicated illness or Injury  Additional Data Reviewed and  Analyzed Further history obtained from: None  Additional Factors Impacting ED Encounter Risk Prescriptions  [name], MD [facility] Emergency Medicine [facility] [email or web address]  Final Clinical Impressions(s) / ED Diagnoses     ICD-10-CM   1. Medication refill  Z76.0       ED Discharge Orders          Ordered    risperiDONE  (RISPERDAL ) 1 MG tablet  2 times daily        09/04/24 0319             Discharge Instructions Discussed with and Provided to Patient:    Discharge Instructions      You were evaluated in the Emergency Department and after careful evaluation, we did not find any emergent condition requiring admission or further testing in the hospital.  Your exam/testing today is overall reassuring.  Follow-up with your regular doctors.  Please return to the Emergency Department if you experience any worsening of your condition.   Thank you for allowing us  to be a part of your care.      Theadore Ozell HERO, MD 09/04/24 [phone]

## 2024-09-04 NOTE — Discharge Instructions (Signed)
 You were evaluated in the Emergency Department and after careful evaluation, we did not find any emergent condition requiring admission or further testing in the hospital.  Your exam/testing today is overall reassuring.  Follow-up with your regular doctors.  Please return to the Emergency Department if you experience any worsening of your condition.   Thank you for allowing us  to be a part of your care.

## 2024-09-05 ENCOUNTER — Encounter (HOSPITAL_COMMUNITY): Payer: Self-pay | Admitting: Emergency Medicine

## 2024-09-05 ENCOUNTER — Emergency Department (HOSPITAL_COMMUNITY)
Admission: EM | Admit: 2024-09-05 | Discharge: 2024-09-05 | Disposition: A | Discharge status: Home / Self Care | Payer: Self-pay | Attending: Emergency Medicine | Admitting: Emergency Medicine

## 2024-09-05 ENCOUNTER — Other Ambulatory Visit: Payer: Self-pay

## 2024-09-05 DIAGNOSIS — Z9229 Personal history of other drug therapy: Secondary | ICD-10-CM

## 2024-09-05 DIAGNOSIS — Z5900 Homelessness unspecified: Secondary | ICD-10-CM | POA: Insufficient documentation

## 2024-09-05 DIAGNOSIS — Z7689 Persons encountering health services in other specified circumstances: Secondary | ICD-10-CM | POA: Insufficient documentation

## 2024-09-05 MED ORDER — RISPERIDONE 1 MG PO TABS
1.0000 mg | ORAL_TABLET | Freq: Once | ORAL | Status: AC
  Administered 2024-09-05: 1 mg via ORAL
  Filled 2024-09-05: qty 1

## 2024-09-05 NOTE — Discharge Instructions (Signed)
 Return for emergent conditions otherwise pick up the prescription of risperidone  that was recently prescribed for you yesterday.

## 2024-09-05 NOTE — ED Provider Notes (Signed)
 [place] EMERGENCY DEPARTMENT AT [facility] Provider Note   CSN: [id number] Arrival date & time: 09/05/24  1943     Patient presents with: No chief complaint on file.   Cole Barrett is a 38 y.o. male.   Cole Barrett is a 38 y.o. year-old male with a history of homelessness, schizophrenia presenting to the ED with chief complaint of medication refill.   Out of his Risperdal , wants a refill.  Received a refill yesterday.  Will give him a dose in the emergency department.  Denies any bodily complaints.   The history is provided by the patient. No language interpreter was used.       Prior to Admission medications   Medication Sig Start Date End Date Taking? Authorizing Provider  acetaminophen  (TYLENOL  8 HOUR) 650 MG CR tablet Take 1 tablet (650 mg total) by mouth every 8 (eight) hours as needed for pain. 02/14/23   Aberman, Caroline C, PA-C  ibuprofen  (ADVIL ) 600 MG tablet Take 1 tablet (600 mg total) by mouth every 6 (six) hours as needed. Patient taking differently: Take 600 mg by mouth every 6 (six) hours as needed for mild pain. 12/24/22   Harris, Abigail, PA-C  mupirocin  cream (BACTROBAN ) 2 % Apply 1 Application topically 2 (two) times daily. 08/28/24   Bauer, Collin S, PA-C  naproxen  (NAPROSYN ) 375 MG tablet Take 1 tablet (375 mg total) by mouth 2 (two) times daily. 02/14/23   Kommor, Madison, MD  risperiDONE  (RISPERDAL ) 1 MG tablet Take 1 tablet (1 mg total) by mouth 2 (two) times daily. 09/04/24   Theadore Ozell HERO, MD  amantadine  (SYMMETREL ) 100 MG capsule Take 1 capsule (100 mg total) by mouth 2 (two) times daily. Patient not taking: Reported on 11/04/2019 09/01/19 11/17/19  [name], NP    Allergies: Patient has no known allergies.    Review of Systems  Constitutional:  Negative for chills and fever.  Respiratory:  Negative for shortness of breath.   Cardiovascular:  Negative for chest pain.  Psychiatric/Behavioral:  Negative for  suicidal ideas.   All other systems reviewed and are negative.   Updated Vital Signs BP 139/86 (BP Location: Left Arm)   Pulse 94   Temp 97.9 F (36.6 C)   Resp 16   Ht 5' 10 (1.778 m)   Wt 99.8 kg   SpO2 100%   BMI 31.57 kg/m   Physical Exam Vitals and nursing note reviewed.  Constitutional:      General: He is not in acute distress.    Appearance: Normal appearance. He is not ill-appearing.     Comments: Disheveled appearance  HENT:     Head: Normocephalic and atraumatic.     Nose: Nose normal.  Eyes:     Conjunctiva/sclera: Conjunctivae normal.  Cardiovascular:     Rate and Rhythm: Normal rate.  Pulmonary:     Effort: Pulmonary effort is normal. No respiratory distress.  Musculoskeletal:        General: No deformity.  Skin:    Findings: No rash.  Neurological:     Mental Status: He is alert.     (all labs ordered are listed, but only abnormal results are displayed) Labs Reviewed - No data to display  EKG: None  Radiology: No results found.   Procedures   Medications Ordered in the ED  risperiDONE  (RISPERDAL ) tablet 1 mg (has no administration in time range)  Medical Decision Making Risk Prescription drug management.   38 year old male presents today with above-mentioned complaints.  Requesting dose of risperidone  and a refill.  This was refilled for him yesterday.  Will give him 1 dose.  Without SI or HI. Exam reassuring. Discussed follow-up with PCP. Return precautions reviewed.  Discharged in stable condition.  Patient well-known to the emergency department with 28 visits in the past 6 months mostly for similar complaints.  Final diagnoses:  Medication administered    ED Discharge Orders     None          Hildegard Loge, PA-C 09/05/24 2115    Elnor Savant A, DO 09/05/24 2348

## 2024-09-06 ENCOUNTER — Emergency Department (HOSPITAL_COMMUNITY)
Admission: EM | Admit: 2024-09-06 | Discharge: 2024-09-06 | Discharge status: Against Medical Advice | Payer: Self-pay | Attending: Emergency Medicine | Admitting: Emergency Medicine

## 2024-09-06 DIAGNOSIS — Z5321 Procedure and treatment not carried out due to patient leaving prior to being seen by health care provider: Secondary | ICD-10-CM | POA: Insufficient documentation

## 2024-09-06 DIAGNOSIS — L989 Disorder of the skin and subcutaneous tissue, unspecified: Secondary | ICD-10-CM | POA: Insufficient documentation

## 2024-09-06 NOTE — ED Triage Notes (Signed)
Unable to locate patient at waiting area by staff.  °

## 2024-09-06 NOTE — ED Triage Notes (Signed)
 Pt reports he would like to be seen for psych evaluation/resources

## 2024-09-06 NOTE — ED Notes (Signed)
 Patient has left stated that he had to get to work

## 2024-09-06 NOTE — ED Triage Notes (Addendum)
 Patient requesting antibiotic cream for his left forearm  skin sore. Also requesting to see a psychiatrist . No SI or HI. Patient seen here last night received his Risperidone  dose and discharged.

## 2024-09-07 ENCOUNTER — Emergency Department (HOSPITAL_COMMUNITY)
Admission: EM | Admit: 2024-09-07 | Discharge: 2024-09-07 | Disposition: A | Discharge status: Home / Self Care | Payer: Self-pay | Attending: Emergency Medicine | Admitting: Emergency Medicine

## 2024-09-07 ENCOUNTER — Encounter (HOSPITAL_COMMUNITY): Payer: Self-pay | Admitting: *Deleted

## 2024-09-07 ENCOUNTER — Other Ambulatory Visit: Payer: Self-pay

## 2024-09-07 DIAGNOSIS — Z7689 Persons encountering health services in other specified circumstances: Secondary | ICD-10-CM | POA: Insufficient documentation

## 2024-09-07 DIAGNOSIS — Z760 Encounter for issue of repeat prescription: Secondary | ICD-10-CM | POA: Insufficient documentation

## 2024-09-07 MED ORDER — MUPIROCIN CALCIUM 2 % EX CREA
TOPICAL_CREAM | Freq: Once | CUTANEOUS | Status: AC
  Administered 2024-09-07: 1 via TOPICAL
  Filled 2024-09-07: qty 15

## 2024-09-07 MED ORDER — RISPERIDONE 1 MG PO TABS: 1.0000 mg | ORAL_TABLET | Freq: Two times a day (BID) | ORAL | 0 refills | Status: DC

## 2024-09-07 NOTE — Discharge Instructions (Signed)
 Please continue taking your medication and use the cream for your left arm wound/rash that is healing.  Please rest and stay hydrated and follow-up with your primary doctor.

## 2024-09-07 NOTE — ED Notes (Signed)
 States he wants antibiotic cream and medication refill. Patient was given new set of scrubs, clothing torned .

## 2024-09-07 NOTE — ED Triage Notes (Addendum)
 Pt states I just need some Risperdal . No other complaints.

## 2024-09-07 NOTE — ED Triage Notes (Signed)
 PT states he needs antibiotic cream refilled. PT walked in under own power and no distress noted.

## 2024-09-07 NOTE — ED Provider Notes (Signed)
 [place] EMERGENCY DEPARTMENT AT [facility] Provider Note   CSN: [id number] Arrival date & time: 09/07/24  9442     Patient presents with: Medication Refill   Cole Barrett is a 38 y.o. male.   The history is provided by the patient and medical records. No language interpreter was used.  Medication Refill Medications/supplies requested:  Risperdal  and more bactroban  cream for arm Reason for request:  Medications ran out Medications taken before: yes - see home medications        Prior to Admission medications   Medication Sig Start Date End Date Taking? Authorizing Provider  acetaminophen  (TYLENOL  8 HOUR) 650 MG CR tablet Take 1 tablet (650 mg total) by mouth every 8 (eight) hours as needed for pain. 02/14/23   Aberman, Caroline C, PA-Barrett  ibuprofen  (ADVIL ) 600 MG tablet Take 1 tablet (600 mg total) by mouth every 6 (six) hours as needed. Patient taking differently: Take 600 mg by mouth every 6 (six) hours as needed for mild pain. 12/24/22   Harris, Abigail, PA-Barrett  mupirocin  cream (BACTROBAN ) 2 % Apply 1 Application topically 2 (two) times daily. 08/28/24   Bauer, Collin S, PA-Barrett  naproxen  (NAPROSYN ) 375 MG tablet Take 1 tablet (375 mg total) by mouth 2 (two) times daily. 02/14/23   Kommor, Madison, MD  risperiDONE  (RISPERDAL ) 1 MG tablet Take 1 tablet (1 mg total) by mouth 2 (two) times daily. 09/04/24   Cole Ozell HERO, MD  amantadine  (SYMMETREL ) 100 MG capsule Take 1 capsule (100 mg total) by mouth 2 (two) times daily. Patient not taking: Reported on 11/04/2019 09/01/19 11/17/19  Cole Bernadette BRAVO, NP    Allergies: Patient has no known allergies.    Review of Systems  Constitutional:  Negative for chills and fatigue.  HENT:  Negative for congestion.   Respiratory:  Negative for cough.   Cardiovascular:  Negative for chest pain.  Gastrointestinal:  Negative for abdominal pain, constipation, diarrhea, nausea and vomiting.  Genitourinary:  Negative for dysuria.   Musculoskeletal:  Negative for back pain.  Skin:  Positive for rash.  Neurological:  Negative for headaches.  Psychiatric/Behavioral:  Negative for agitation.   All other systems reviewed and are negative.   Updated Vital Signs BP (!) 141/96 (BP Location: Right Arm)   Pulse 70   Temp 97.6 F (36.4 Barrett)   Resp 18   Ht 5' 10 (1.778 m)   Wt 99.8 kg   SpO2 100%   BMI 31.57 kg/m   Physical Exam Vitals and nursing note reviewed.  Constitutional:      General: He is not in acute distress.    Appearance: He is well-developed. He is not ill-appearing.  HENT:     Head: Normocephalic and atraumatic.     Nose: No congestion.  Eyes:     Conjunctiva/sclera: Conjunctivae normal.     Pupils: Pupils are equal, round, and reactive to light.  Cardiovascular:     Rate and Rhythm: Normal rate and regular rhythm.     Heart sounds: No murmur heard. Pulmonary:     Effort: Pulmonary effort is normal. No respiratory distress.     Breath sounds: Normal breath sounds. No wheezing, rhonchi or rales.  Chest:     Chest wall: No tenderness.  Abdominal:     Palpations: Abdomen is soft.     Tenderness: There is no abdominal tenderness.  Musculoskeletal:        General: No swelling or tenderness.     Cervical  back: Neck supple.  Skin:    General: Skin is warm and dry.     Capillary Refill: Capillary refill takes less than 2 seconds.     Findings: Rash (improving rash on l arm) present. No erythema.  Neurological:     General: No focal deficit present.     Mental Status: He is alert.  Psychiatric:        Mood and Affect: Mood normal.     (all labs ordered are listed, but only abnormal results are displayed) Labs Reviewed - No data to display  EKG: None  Radiology: No results found.   Procedures   Medications Ordered in the ED  mupirocin  cream (BACTROBAN ) 2 % (has no administration in time range)                                    Medical Decision Making Risk Prescription  drug management.    Cole Barrett is a 38 y.o. male with a past medical history significant for for schizophrenia, hypercholesterolemia, pulmonary embolism, and previous alcohol abuse who presents for medication refill.  Patient says that he ran out of the cream he has been due to his left arm that has been significant proving his rash and wounds and he also needs a prescription for his Risperdal .  He otherwise denies any complaints and is feeling well.  He specifically denies any fevers, chills, chest pain, shortness breath, nausea, vomiting, constipation, diarrhea, or urinary changes.  No other complaints reported.  On exam: Lungs are clear.  Chest nontender.  Abdomen nontender.  He is sitting with no distress or complaints.  He has some small healing and scabbed over wounds on his left arm that he wants some more cream to help finish treating as he has no more of the cream left.  Otherwise he denies abnormalities or complaints.  Based on his well appearance and lack of other complaints, we will give him a dose of the cream and he can take the 2 with him and we will give him a prescription printed for his Risperdal  dose.  He will follow-up with PCP.  He denies questions or concerns and we will discharge for outpatient follow-up.      Final diagnoses:  Medication refill    ED Discharge Orders          Ordered    risperiDONE  (RISPERDAL ) 1 MG tablet  2 times daily        09/07/24 0847            Clinical Impression: 1. Medication refill     Disposition: Discharge  Condition: Good  I have discussed the results, Dx and Tx plan with the pt(& family if present). He/she/they expressed understanding and agree(Barrett) with the plan. Discharge instructions discussed at great length. Strict return precautions discussed and pt &/or family have verbalized understanding of the instructions. No further questions at time of discharge.    New Prescriptions   RISPERIDONE  (RISPERDAL ) 1  MG TABLET    Take 1 tablet (1 mg total) by mouth 2 (two) times daily.    Follow Up: [facility] [address] [place] [address] [place]  [id number] [phone] Schedule an appointment as soon as possible for a visit    [facility] Emergency Department at [facility] [address] [place]  [phone]  Deitra Craine, Lonni PARAS, MD 09/07/24 [phone]

## 2024-09-08 ENCOUNTER — Other Ambulatory Visit: Payer: Self-pay

## 2024-09-08 ENCOUNTER — Emergency Department (HOSPITAL_COMMUNITY)
Admission: EM | Admit: 2024-09-08 | Discharge: 2024-09-08 | Disposition: A | Payer: Self-pay | Attending: Emergency Medicine | Admitting: Emergency Medicine

## 2024-09-08 DIAGNOSIS — Z7689 Persons encountering health services in other specified circumstances: Secondary | ICD-10-CM

## 2024-09-08 MED ORDER — RISPERIDONE 1 MG PO TABS
1.0000 mg | ORAL_TABLET | Freq: Two times a day (BID) | ORAL | Status: DC
  Administered 2024-09-08: 1 mg via ORAL
  Filled 2024-09-08: qty 1

## 2024-09-08 NOTE — ED Provider Notes (Signed)
 [name] Provider Note   CSN: [id number] Arrival date & time: 09/07/24  2346     Patient presents with: Medication Refill   Cole Barrett is a 38 y.o. male.   38 year old male, well-known to the emergency department, with history of hyperlipidemia, schizophrenia, paranoid behavior, homelessness presents for dose of his Risperdal .  States that he still needs to pick this up at the pharmacy.  Denies any other acute complaints or hallucinations.  The history is provided by the patient. No language interpreter was used.  Medication Refill      Prior to Admission medications   Medication Sig Start Date End Date Taking? Authorizing Provider  acetaminophen  (TYLENOL  8 HOUR) 650 MG CR tablet Take 1 tablet (650 mg total) by mouth every 8 (eight) hours as needed for pain. 02/14/23   Aberman, Caroline C, PA-C  ibuprofen  (ADVIL ) 600 MG tablet Take 1 tablet (600 mg total) by mouth every 6 (six) hours as needed. Patient taking differently: Take 600 mg by mouth every 6 (six) hours as needed for mild pain. 12/24/22   Harris, Abigail, PA-C  mupirocin  cream (BACTROBAN ) 2 % Apply 1 Application topically 2 (two) times daily. 08/28/24   Bauer, Collin S, PA-C  naproxen  (NAPROSYN ) 375 MG tablet Take 1 tablet (375 mg total) by mouth 2 (two) times daily. 02/14/23   Kommor, Madison, MD  risperiDONE  (RISPERDAL ) 1 MG tablet Take 1 tablet (1 mg total) by mouth 2 (two) times daily. 09/04/24   Theadore Ozell HERO, MD  risperiDONE  (RISPERDAL ) 1 MG tablet Take 1 tablet (1 mg total) by mouth 2 (two) times daily. 09/07/24   Tegeler, Lonni PARAS, MD  amantadine  (SYMMETREL ) 100 MG capsule Take 1 capsule (100 mg total) by mouth 2 (two) times daily. Patient not taking: Reported on 11/04/2019 09/01/19 11/17/19  [name], NP    Allergies: Patient has no known allergies.    Review of Systems Ten systems reviewed and are negative for acute change, except as noted  in the HPI.    Updated Vital Signs BP 116/86 (BP Location: Right Arm)   Pulse 62   Temp 97.9 F (36.6 C)   Resp 16   SpO2 100%   Physical Exam Vitals and nursing note reviewed.  Constitutional:      General: He is not in acute distress.    Appearance: He is well-developed. He is not diaphoretic.     Comments: Eating [employer]. At known baseline. Nontoxic appearing.  HENT:     Head: Normocephalic and atraumatic.  Eyes:     General: No scleral icterus.    Conjunctiva/sclera: Conjunctivae normal.  Pulmonary:     Effort: Pulmonary effort is normal. No respiratory distress.     Comments: Respirations even and unlabored Musculoskeletal:        General: Normal range of motion.     Cervical back: Normal range of motion.  Skin:    General: Skin is warm and dry.     Coloration: Skin is not pale.     Findings: No erythema or rash.  Neurological:     Mental Status: He is alert and oriented to person, place, and time.  Psychiatric:        Behavior: Behavior normal.     (all labs ordered are listed, but only abnormal results are displayed) Labs Reviewed - No data to display  EKG: None  Radiology: No results found.   Procedures   Medications Ordered in the ED  risperiDONE  (RISPERDAL ) tablet 1 mg (has no administration in time range)                                    Medical Decision Making Risk Prescription drug management.   This patient presents to the ED for concern of needing his medication, this involves an extensive number of treatment options, and is a complaint that carries with it a high risk of complications and morbidity.    Co morbidities that complicate the patient evaluation  HLD Schizophrenia   Additional history obtained:  External records from outside source obtained and reviewed including prior discharge summaries.   Cardiac Monitoring:  The patient was maintained on a cardiac monitor.  I personally viewed and interpreted the cardiac  monitored which showed an underlying rhythm of: NSR   Medicines ordered and prescription drug management:  I ordered medication including Risperdal  for medication compliance  I have reviewed the patients home medicines and have made adjustments as needed   Problem List / ED Course:  Given dose of Risperdal . No other acute complaints. At known baseline, eating food in no apparent distress.   Reevaluation:  After the interventions noted above, I reevaluated the patient and found that they have :stayed the same   Social Determinants of Health:  Housing instability   Dispostion:  After consideration of the diagnostic results and the patients response to treatment, I feel that the patent would benefit from outpatient psychiatric follow up. Encouraged patient to pick up his Rx which as already been called into the pharmacy.       Final diagnoses:  Encounter for medication administration    ED Discharge Orders     None          Keith Sor, DEVONNA 09/08/24 2152    Darra Fonda MATSU, MD 09/09/24 [phone]

## 2024-09-09 ENCOUNTER — Other Ambulatory Visit: Payer: Self-pay

## 2024-09-09 ENCOUNTER — Encounter (HOSPITAL_COMMUNITY): Payer: Self-pay | Admitting: *Deleted

## 2024-09-09 ENCOUNTER — Emergency Department (HOSPITAL_COMMUNITY)
Admission: EM | Admit: 2024-09-09 | Discharge: 2024-09-09 | Disposition: A | Discharge status: Home / Self Care | Payer: Self-pay | Attending: Emergency Medicine | Admitting: Emergency Medicine

## 2024-09-09 DIAGNOSIS — Z760 Encounter for issue of repeat prescription: Secondary | ICD-10-CM | POA: Insufficient documentation

## 2024-09-09 DIAGNOSIS — Z5900 Homelessness unspecified: Secondary | ICD-10-CM | POA: Insufficient documentation

## 2024-09-09 NOTE — ED Triage Notes (Signed)
 Patient is seen almost everyday for same only wants antibiotic cream

## 2024-09-09 NOTE — ED Provider Notes (Signed)
 [place] EMERGENCY DEPARTMENT AT [facility] Provider Note   CSN: [id number] Arrival date & time: 09/09/24  0404     Patient presents with: Medication Refill   Banjamin Barrett is a 38 y.o. male with PMHx homelessness, schizoaffective disorder, paranoid behavior who presents to ED requesting ABX ointment to place on chronic skin ulcerations/wounds. Patient denies any other acute symptoms. Denies fever, chest pain, dyspnea, cough, nausea, vomiting, diarrhea.    Medication Refill      Prior to Admission medications   Medication Sig Start Date End Date Taking? Authorizing Provider  acetaminophen  (TYLENOL  8 HOUR) 650 MG CR tablet Take 1 tablet (650 mg total) by mouth every 8 (eight) hours as needed for pain. 02/14/23   Aberman, Caroline C, PA-C  ibuprofen  (ADVIL ) 600 MG tablet Take 1 tablet (600 mg total) by mouth every 6 (six) hours as needed. Patient taking differently: Take 600 mg by mouth every 6 (six) hours as needed for mild pain. 12/24/22   Harris, Abigail, PA-C  mupirocin  cream (BACTROBAN ) 2 % Apply 1 Application topically 2 (two) times daily. 08/28/24   Bauer, Collin S, PA-C  naproxen  (NAPROSYN ) 375 MG tablet Take 1 tablet (375 mg total) by mouth 2 (two) times daily. 02/14/23   Kommor, Madison, MD  risperiDONE  (RISPERDAL ) 1 MG tablet Take 1 tablet (1 mg total) by mouth 2 (two) times daily. 09/04/24   Theadore Ozell HERO, MD  risperiDONE  (RISPERDAL ) 1 MG tablet Take 1 tablet (1 mg total) by mouth 2 (two) times daily. 09/07/24   Tegeler, Lonni PARAS, MD  amantadine  (SYMMETREL ) 100 MG capsule Take 1 capsule (100 mg total) by mouth 2 (two) times daily. Patient not taking: Reported on 11/04/2019 09/01/19 11/17/19  [name], NP    Allergies: Patient has no known allergies.    Review of Systems  Skin:  Positive for wound.    Updated Vital Signs BP 124/88 (BP Location: Left Arm)   Pulse 86   Temp 98.2 F (36.8 C) (Oral)   Resp 16   Ht 5' 10 (1.778 m)    Wt 99 kg   SpO2 100%   BMI 31.32 kg/m   Physical Exam Vitals and nursing note reviewed.  Constitutional:      General: He is not in acute distress.    Appearance: He is not ill-appearing or toxic-appearing.  HENT:     Head: Normocephalic and atraumatic.  Eyes:     General: No scleral icterus.       Right eye: No discharge.        Left eye: No discharge.     Conjunctiva/sclera: Conjunctivae normal.  Cardiovascular:     Rate and Rhythm: Normal rate.  Pulmonary:     Effort: Pulmonary effort is normal.  Abdominal:     General: Abdomen is flat.  Skin:    General: Skin is warm and dry.     Comments: Mostly healed ulcerations on left forearm. No surrounding swelling, erythema, purulence, or increased warmth.  Neurological:     General: No focal deficit present.     Mental Status: He is alert and oriented to person, place, and time. Mental status is at baseline.  Psychiatric:        Mood and Affect: Mood normal.        Behavior: Behavior normal.     (all labs ordered are listed, but only abnormal results are displayed) Labs Reviewed - No data to display  EKG: None  Radiology: No results  found.   Procedures   Medications Ordered in the ED - No data to display                                  Medical Decision Making  This patient presents to the ED for concerned for rash, this involves an extensive number of treatment options, and is a complaint that carries with it a high risk of complications and morbidity.  The differential diagnosis includes irritant contact dermatitis, DRESS, atopic dermatitis, anaphylaxis, SJS/TEN   Co morbidities that complicate the patient evaluation   homelessness, schizoaffective disorder, paranoid behavior    Additional history obtained:  No PCP listed in chart   Problem List / ED Course / Critical interventions / Medication management  Patient presents to ED requesting ABX ointment for his chronic skin wounds.  Physical exam with  mostly healed ulcerations of left forearm.  Rest of physical exam reassuring.  Patient afebrile with stable vitals.  Patient NAD and eating sandwich provided in the ED. Provided patient with ABX ointment.  Patient to follow-up with PCP. I have reviewed the patients home medicines and have made adjustments as needed The patient has been appropriately medically screened and/or stabilized in the ED. I have low suspicion for any other emergent medical condition which would require further screening, evaluation or treatment in the ED or require inpatient management. At time of discharge the patient is hemodynamically stable and in no acute distress. I have discussed work-up results and diagnosis with patient and answered all questions. Patient is agreeable with discharge plan. We discussed strict return precautions for returning to the emergency department and they verbalized understanding.     Social Determinants of Health:  homeless       Final diagnoses:  Medication refill    ED Discharge Orders     None          Hoy Nidia FALCON, [place] 09/09/24 0449    Haze Lonni PARAS, MD 09/09/24 [phone]

## 2024-09-09 NOTE — Discharge Instructions (Addendum)
 Please follow-up with your primary care provider.  Seek emergency care if experiencing any new or worsening symptoms.

## 2024-09-12 ENCOUNTER — Encounter (HOSPITAL_COMMUNITY): Payer: Self-pay | Admitting: Emergency Medicine

## 2024-09-12 ENCOUNTER — Other Ambulatory Visit: Payer: Self-pay

## 2024-09-12 ENCOUNTER — Emergency Department (HOSPITAL_COMMUNITY)
Admission: EM | Admit: 2024-09-12 | Discharge: 2024-09-12 | Disposition: A | Discharge status: Home / Self Care | Payer: Self-pay | Attending: Emergency Medicine | Admitting: Emergency Medicine

## 2024-09-12 DIAGNOSIS — Z9229 Personal history of other drug therapy: Secondary | ICD-10-CM

## 2024-09-12 DIAGNOSIS — Z760 Encounter for issue of repeat prescription: Secondary | ICD-10-CM | POA: Insufficient documentation

## 2024-09-12 MED ORDER — RISPERIDONE 1 MG PO TABS
1.0000 mg | ORAL_TABLET | Freq: Once | ORAL | Status: AC
  Administered 2024-09-12: 1 mg via ORAL
  Filled 2024-09-12 (×3): qty 1

## 2024-09-12 NOTE — ED Triage Notes (Signed)
 Pt in requesting dose of Risperdal , presents frequently for same. No other needs at this time.

## 2024-09-12 NOTE — Discharge Instructions (Signed)
 Please follow-up with your primary care doctor for management of your health

## 2024-09-12 NOTE — ED Provider Notes (Signed)
 [place] EMERGENCY DEPARTMENT AT [facility] Provider Note   CSN: [id number] Arrival date & time: 09/12/24  9359     Patient presents with: Medication Refill   Cole Barrett is a 38 y.o. male.   The history is provided by the patient and medical records. No language interpreter was used.  Medication Refill    38 year old male with significant history of schizoaffective disorder, undomiciled, polysubstance use, presenting requesting for medication.  Patient request for a dose of Risperdal .  He normally comes to the ER to request for the same.  Last dose was yesterday.  Patient states he just wants to have a place for him to sleep for a little bit.  He does not have any other complaint.  Prior to Admission medications   Medication Sig Start Date End Date Taking? Authorizing Provider  acetaminophen  (TYLENOL  8 HOUR) 650 MG CR tablet Take 1 tablet (650 mg total) by mouth every 8 (eight) hours as needed for pain. 02/14/23   Lorelle Aleck BROCKS, PA-C  ibuprofen  (ADVIL ) 600 MG tablet Take 1 tablet (600 mg total) by mouth every 6 (six) hours as needed. Patient taking differently: Take 600 mg by mouth every 6 (six) hours as needed for mild pain. 12/24/22   Harris, Abigail, PA-C  mupirocin  cream (BACTROBAN ) 2 % Apply 1 Application topically 2 (two) times daily. 08/28/24   Beola Terrall RAMAN, PA-C  naproxen  (NAPROSYN ) 375 MG tablet Take 1 tablet (375 mg total) by mouth 2 (two) times daily. 02/14/23   Kommor, Madison, MD  risperiDONE  (RISPERDAL ) 1 MG tablet Take 1 tablet (1 mg total) by mouth 2 (two) times daily. 09/04/24   Theadore Ozell HERO, MD  risperiDONE  (RISPERDAL ) 1 MG tablet Take 1 tablet (1 mg total) by mouth 2 (two) times daily. 09/07/24   Tegeler, Lonni PARAS, MD  amantadine  (SYMMETREL ) 100 MG capsule Take 1 capsule (100 mg total) by mouth 2 (two) times daily. Patient not taking: Reported on 11/04/2019 09/01/19 11/17/19  [name], NP    Allergies: Patient has no  known allergies.    Review of Systems  Psychiatric/Behavioral:  Negative for suicidal ideas.     Updated Vital Signs BP (!) 141/91 (BP Location: Left Arm)   Pulse 83   Temp 97.9 F (36.6 C) (Oral)   Resp 17   Wt 99 kg   SpO2 100%   BMI 31.32 kg/m   Physical Exam Constitutional:      General: He is not in acute distress.    Appearance: He is well-developed.     Comments: Patient is sitting in chair, leaning his head against the wall sleeping but easily arousable and appears to be in no acute discomfort.  HENT:     Head: Atraumatic.  Eyes:     Conjunctiva/sclera: Conjunctivae normal.  Musculoskeletal:     Cervical back: Normal range of motion and neck supple.  Skin:    Findings: No rash.  Neurological:     Mental Status: He is alert. Mental status is at baseline.  Psychiatric:        Mood and Affect: Mood normal.     (all labs ordered are listed, but only abnormal results are displayed) Labs Reviewed - No data to display  EKG: None  Radiology: No results found.   Procedures   Medications Ordered in the ED  risperiDONE  (RISPERDAL ) tablet 1 mg (1 mg Oral Given 09/12/24 0835)  Medical Decision Making  BP (!) 141/91 (BP Location: Left Arm)   Pulse 83   Temp 97.9 F (36.6 C) (Oral)   Resp 17   Wt 99 kg   SpO2 100%   BMI 31.32 kg/m   66:82 AM 38 year old male with significant history of schizoaffective disorder, undomiciled, polysubstance use, presenting requesting for medication.  Patient request for a dose of Risperdal .  He normally comes to the ER to request for the same.  Last dose was yesterday.  Patient states he just wants to have a place for him to sleep for a little bit.  He does not have any other complaint.  Exam overall reassuring.  EMR review patient has been seen evaluate multiple times for similar presentation.  Will provide patient with Risperdal  1 mg and encourage patient to follow-up with PCP for further  management of his continued health.     Final diagnoses:  Medication administered    ED Discharge Orders     None          Nivia Colon, PA-C 09/12/24 1004    [name], MD 09/12/24 1525

## 2024-09-13 ENCOUNTER — Emergency Department (HOSPITAL_COMMUNITY)
Admission: EM | Admit: 2024-09-13 | Discharge: 2024-09-14 | Discharge status: Against Medical Advice | Payer: Self-pay | Attending: Emergency Medicine | Admitting: Emergency Medicine

## 2024-09-13 ENCOUNTER — Encounter (HOSPITAL_COMMUNITY): Payer: Self-pay

## 2024-09-13 DIAGNOSIS — Z5321 Procedure and treatment not carried out due to patient leaving prior to being seen by health care provider: Secondary | ICD-10-CM | POA: Insufficient documentation

## 2024-09-13 DIAGNOSIS — Z760 Encounter for issue of repeat prescription: Secondary | ICD-10-CM | POA: Insufficient documentation

## 2024-09-13 NOTE — ED Triage Notes (Signed)
 Pt requesting dose of Risperidal, not other complaints at this time.

## 2024-09-17 ENCOUNTER — Other Ambulatory Visit: Payer: Self-pay

## 2024-09-17 ENCOUNTER — Emergency Department (HOSPITAL_COMMUNITY)
Admission: EM | Admit: 2024-09-17 | Discharge: 2024-09-17 | Disposition: A | Discharge status: Home / Self Care | Payer: Self-pay | Attending: Emergency Medicine | Admitting: Emergency Medicine

## 2024-09-17 ENCOUNTER — Encounter (HOSPITAL_COMMUNITY): Payer: Self-pay | Admitting: Emergency Medicine

## 2024-09-17 DIAGNOSIS — Z5900 Homelessness unspecified: Secondary | ICD-10-CM | POA: Insufficient documentation

## 2024-09-17 DIAGNOSIS — Z760 Encounter for issue of repeat prescription: Secondary | ICD-10-CM | POA: Insufficient documentation

## 2024-09-17 DIAGNOSIS — F209 Schizophrenia, unspecified: Secondary | ICD-10-CM | POA: Insufficient documentation

## 2024-09-17 DIAGNOSIS — T148XXA Other injury of unspecified body region, initial encounter: Secondary | ICD-10-CM

## 2024-09-17 DIAGNOSIS — F424 Excoriation (skin-picking) disorder: Secondary | ICD-10-CM | POA: Insufficient documentation

## 2024-09-17 MED ORDER — MUPIROCIN CALCIUM 2 % EX CREA
1.0000 | TOPICAL_CREAM | Freq: Two times a day (BID) | CUTANEOUS | Status: DC
  Filled 2024-09-17: qty 15

## 2024-09-17 MED ORDER — RISPERIDONE 1 MG PO TABS: 1.0000 mg | ORAL_TABLET | Freq: Two times a day (BID) | ORAL | 0 refills | Status: DC

## 2024-09-17 MED ORDER — MUPIROCIN CALCIUM 2 % EX CREA
1.0000 | TOPICAL_CREAM | Freq: Two times a day (BID) | CUTANEOUS | 0 refills | Status: DC
Start: 2024-09-17 — End: 2024-09-23

## 2024-09-17 MED ORDER — RISPERIDONE 1 MG PO TABS: 1.0000 mg | ORAL_TABLET | Freq: Two times a day (BID) | ORAL | 0 refills | Status: AC

## 2024-09-17 MED ORDER — MUPIROCIN CALCIUM 2 % EX CREA: 1.0000 | TOPICAL_CREAM | Freq: Two times a day (BID) | CUTANEOUS | 0 refills | Status: DC

## 2024-09-17 MED ORDER — RISPERIDONE 1 MG PO TABS
1.0000 mg | ORAL_TABLET | Freq: Two times a day (BID) | ORAL | Status: DC
  Administered 2024-09-17: 1 mg via ORAL
  Filled 2024-09-17: qty 1

## 2024-09-17 NOTE — ED Triage Notes (Signed)
 Pt requesting dose of Risperdal  and antibiotic cream for a scratch on his arm. No other complaints.

## 2024-09-17 NOTE — ED Provider Notes (Signed)
 [place] EMERGENCY DEPARTMENT AT [facility] Provider Note   CSN: [id number] Arrival date & time: 09/17/24  9265     Patient presents with: Medication Refill   Cole Barrett is a 38 y.o. male.    Medication Refill    38 year old male with medical history significant for schizophrenia and homelessness who presents to the emergency department with need for refill of his Risperdal .  The patient also states that he has had some skin lesions on his left arm that he would like evaluated.  He request an antibiotic cream for it.  He denies any other complaints.  Denies any SI, HI or AVH at this time.  Prior to Admission medications   Medication Sig Start Date End Date Taking? Authorizing Provider  mupirocin  cream (BACTROBAN ) 2 % Apply 1 Application topically 2 (two) times daily. 09/17/24  Yes Jerrol Agent, MD  risperiDONE  (RISPERDAL ) 1 MG tablet Take 1 tablet (1 mg total) by mouth 2 (two) times daily. 09/17/24 10/17/24 Yes Jerrol Agent, MD  acetaminophen  (TYLENOL  8 HOUR) 650 MG CR tablet Take 1 tablet (650 mg total) by mouth every 8 (eight) hours as needed for pain. 02/14/23   Aberman, Caroline C, PA-C  ibuprofen  (ADVIL ) 600 MG tablet Take 1 tablet (600 mg total) by mouth every 6 (six) hours as needed. Patient taking differently: Take 600 mg by mouth every 6 (six) hours as needed for mild pain. 12/24/22   Harris, Abigail, PA-C  naproxen  (NAPROSYN ) 375 MG tablet Take 1 tablet (375 mg total) by mouth 2 (two) times daily. 02/14/23   Kommor, Madison, MD  amantadine  (SYMMETREL ) 100 MG capsule Take 1 capsule (100 mg total) by mouth 2 (two) times daily. Patient not taking: Reported on 11/04/2019 09/01/19 11/17/19  [name], NP    Allergies: Patient has no known allergies.    Review of Systems  All other systems reviewed and are negative.   Updated Vital Signs BP 129/81 (BP Location: Right Arm)   Pulse 72   Temp 98.1 F (36.7 C) (Oral)   Resp 15   SpO2 100%    Physical Exam Vitals and nursing note reviewed.  Constitutional:      General: He is not in acute distress. HENT:     Head: Normocephalic and atraumatic.  Eyes:     Conjunctiva/sclera: Conjunctivae normal.     Pupils: Pupils are equal, round, and reactive to light.  Cardiovascular:     Rate and Rhythm: Normal rate and regular rhythm.  Pulmonary:     Effort: Pulmonary effort is normal. No respiratory distress.  Abdominal:     General: There is no distension.     Tenderness: There is no guarding.  Musculoskeletal:        General: No deformity or signs of injury.     Cervical back: Neck supple.  Skin:    Findings: No lesion or rash.     Comments: Excoriations to the left arm noted, no surrounding erythema or warmth  Neurological:     General: No focal deficit present.     Mental Status: He is alert. Mental status is at baseline.  Psychiatric:        Attention and Perception: Attention and perception normal.        Mood and Affect: Mood and affect normal.        Speech: Speech normal.        Behavior: Behavior normal. Behavior is cooperative.        Thought Content:  Thought content normal.     (all labs ordered are listed, but only abnormal results are displayed) Labs Reviewed - No data to display  EKG: None  Radiology: No results found.   Procedures   Medications Ordered in the ED  mupirocin  cream (BACTROBAN ) 2 % 1 Application (has no administration in time range)  risperiDONE  (RISPERDAL ) tablet 1 mg (has no administration in time range)                                    Medical Decision Making   38 year old male with medical history significant for schizophrenia and homelessness who presents to the emergency department with need for refill of his Risperdal .  The patient also states that he has had some skin lesions on his left arm that he would like evaluated.  He request an antibiotic cream for it.  He denies any other complaints.  Denies any SI, HI or AVH  at this time.  On arrival, the patient was vitally stable.  Presenting for a medication refill.  States that he has run out of his home Risperdal .  Single dose of Risperdal  provided and prescription also provided.  Patient with an excoriation to the left arm, no evidence of abscess or significant cellulitis, will provide Bactroban  ointment for this.  Patient has schizophrenia and homelessness, no apparent decompensation at this time.  Patient overall stable for discharge at this time.     Final diagnoses:  Medication refill  Schizophrenia, unspecified type (HCC)  Skin excoriation  Homelessness    ED Discharge Orders          Ordered    mupirocin  cream (BACTROBAN ) 2 %  2 times daily        09/17/24 1139    risperiDONE  (RISPERDAL ) 1 MG tablet  2 times daily        09/17/24 1139               Jerrol Agent, MD 09/17/24 1139

## 2024-09-23 ENCOUNTER — Other Ambulatory Visit: Payer: Self-pay

## 2024-09-23 ENCOUNTER — Encounter (HOSPITAL_COMMUNITY): Payer: Self-pay

## 2024-09-23 ENCOUNTER — Emergency Department (HOSPITAL_COMMUNITY)
Admission: EM | Admit: 2024-09-23 | Discharge: 2024-09-23 | Disposition: A | Discharge status: Home / Self Care | Payer: Self-pay

## 2024-09-23 DIAGNOSIS — R21 Rash and other nonspecific skin eruption: Secondary | ICD-10-CM | POA: Insufficient documentation

## 2024-09-23 DIAGNOSIS — Z760 Encounter for issue of repeat prescription: Secondary | ICD-10-CM | POA: Insufficient documentation

## 2024-09-23 MED ORDER — MUPIROCIN CALCIUM 2 % EX CREA: 1.0000 | TOPICAL_CREAM | Freq: Two times a day (BID) | CUTANEOUS | 0 refills | Status: DC

## 2024-09-23 MED ORDER — RISPERIDONE 1 MG PO TABS
1.0000 mg | ORAL_TABLET | Freq: Once | ORAL | Status: AC
  Administered 2024-09-23: 1 mg via ORAL
  Filled 2024-09-23 (×2): qty 1

## 2024-09-23 MED ORDER — BACITRACIN ZINC 500 UNIT/GM EX OINT
TOPICAL_OINTMENT | Freq: Once | CUTANEOUS | Status: AC
  Administered 2024-09-23: 1 via TOPICAL
  Filled 2024-09-23: qty 0.9

## 2024-09-23 NOTE — ED Notes (Signed)
 Patient states I have to go get my sandwich from [employer].  Just come get me to give me my medicine and discharge papers.

## 2024-09-23 NOTE — ED Triage Notes (Signed)
 Patient is here wanting to get his risperidone .

## 2024-09-23 NOTE — ED Triage Notes (Signed)
 Pt came in via POV requesting refill of his Resperidone. Denies any acute stress, A/Ox4.

## 2024-09-23 NOTE — Discharge Instructions (Signed)
 You may apply bacitracin  to your rash as needed.

## 2024-09-23 NOTE — ED Provider Notes (Signed)
 [place] EMERGENCY DEPARTMENT AT [facility] Provider Note   CSN: [id number] Arrival date & time: 09/23/24  1740     Patient presents with: No chief complaint on file.   Cole Barrett is a 38 y.o. male.   The history is provided by the patient and medical records. No language interpreter was used.     Patient here requesting for a dose of his Risperdone.  He does not have any other complaint.  He has been seen and evaluated multiple times for same.  Last time that he was seen  for this in the ER was 09/17/2024.[name]  Patient also requesting for cream for his right arm rash.  Rash is painful and itchy and ongoing for months.  No fever no other complaint.  Prior to Admission medications   Medication Sig Start Date End Date Taking? Authorizing Provider  acetaminophen  (TYLENOL  8 HOUR) 650 MG CR tablet Take 1 tablet (650 mg total) by mouth every 8 (eight) hours as needed for pain. 02/14/23   Aberman, Caroline C, PA-C  ibuprofen  (ADVIL ) 600 MG tablet Take 1 tablet (600 mg total) by mouth every 6 (six) hours as needed. Patient taking differently: Take 600 mg by mouth every 6 (six) hours as needed for mild pain. 12/24/22   Harris, Abigail, PA-C  mupirocin  cream (BACTROBAN ) 2 % Apply 1 Application topically 2 (two) times daily. 09/17/24   Jerrol Agent, MD  naproxen  (NAPROSYN ) 375 MG tablet Take 1 tablet (375 mg total) by mouth 2 (two) times daily. 02/14/23   Kommor, Madison, MD  risperiDONE  (RISPERDAL ) 1 MG tablet Take 1 tablet (1 mg total) by mouth 2 (two) times daily. 09/17/24 10/17/24  Jerrol Agent, MD  amantadine  (SYMMETREL ) 100 MG capsule Take 1 capsule (100 mg total) by mouth 2 (two) times daily. Patient not taking: Reported on 11/04/2019 09/01/19 11/17/19  [name], NP    Allergies: Patient has no known allergies.    Review of Systems  Constitutional:  Negative for fever.  Skin:  Positive for rash.    Updated Vital Signs BP (!) 145/99   Pulse (!) 106    Temp 98.2 F (36.8 C)   Resp 18   Ht 5' 10 (1.778 m)   Wt 99 kg   SpO2 98%   BMI 31.32 kg/m   Physical Exam Constitutional:      General: He is not in acute distress.    Appearance: He is well-developed.  HENT:     Head: Atraumatic.  Eyes:     Conjunctiva/sclera: Conjunctivae normal.  Musculoskeletal:     Cervical back: Normal range of motion and neck supple.  Skin:    Findings: No rash.     Comments: Right forearm with multiple scabs from scratching but no surrounding erythema or warmth noted.  Neurological:     Mental Status: He is alert. Mental status is at baseline.     (all labs ordered are listed, but only abnormal results are displayed) Labs Reviewed - No data to display  EKG: None  Radiology: No results found.   Procedures   Medications Ordered in the ED - No data to display                                  Medical Decision Making  BP (!) 145/99   Pulse (!) 106   Temp 98.2 F (36.8 C)   Resp 18  Ht 5' 10 (1.778 m)   Wt 99 kg   SpO2 98%   BMI 31.32 kg/m   7:23 PM   Patient here requesting for a dose of his Risperdone.  He does not have any other complaint.  He has been seen and evaluated multiple times for same.  Last time that he was seen  for this in the ER was 09/17/2024.[name]  Patient also requesting for cream for his right arm rash.  Rash is painful and itchy and ongoing for months.  No fever no other complaint.  Patient resting comfortably appears to be in no acute discomfort.  EMR reviewed patient has been seen and evaluated multiple time for same.  Will give 1 dose of Risperdal  1 mg p.o.  Patient also requesting for cream for his rash on his right forearm.  He was concerned for infection.  He has been scratching at it.  This is an ongoing issue.  No obvious signs of erythema but patient does have some scabbed lesion to the skin.  Will provide bacitracin  cream.     Final diagnoses:  Medication refill  Rash    ED Discharge Orders           Ordered    mupirocin  cream (BACTROBAN ) 2 %  2 times daily        09/23/24 1941               Nivia Colon, PA-C 09/23/24 1941    Neysa Caron PARAS, DO 09/23/24 2312

## 2024-09-24 ENCOUNTER — Other Ambulatory Visit: Payer: Self-pay

## 2024-09-24 ENCOUNTER — Emergency Department (HOSPITAL_COMMUNITY)
Admission: EM | Admit: 2024-09-24 | Discharge: 2024-09-24 | Disposition: A | Discharge status: Home / Self Care | Payer: Self-pay | Attending: Emergency Medicine | Admitting: Emergency Medicine

## 2024-09-24 DIAGNOSIS — F209 Schizophrenia, unspecified: Secondary | ICD-10-CM | POA: Insufficient documentation

## 2024-09-24 DIAGNOSIS — Z760 Encounter for issue of repeat prescription: Secondary | ICD-10-CM | POA: Insufficient documentation

## 2024-09-24 MED ORDER — RISPERIDONE 1 MG PO TABS
1.0000 mg | ORAL_TABLET | Freq: Once | ORAL | Status: AC
  Administered 2024-09-24: 1 mg via ORAL
  Filled 2024-09-24: qty 1

## 2024-09-24 NOTE — ED Provider Notes (Signed)
 [place] EMERGENCY DEPARTMENT AT [facility] Provider Note   CSN: [id number] Arrival date & time: 09/24/24  9292     Patient presents with: Medication Refill   Cole Barrett is a 38 y.o. male with past medical history sniffer homelessness, schizophrenia, previous PE, hyperlipidemia who presents with concern for medication refill.  Patient is seen frequently, normally to receive one-time dose of risperidone .  He has no other complaints today.  Also has some scattered excoriations on bilateral arms and requesting some bacitracin .    Medication Refill      Prior to Admission medications   Medication Sig Start Date End Date Taking? Authorizing Provider  acetaminophen  (TYLENOL  8 HOUR) 650 MG CR tablet Take 1 tablet (650 mg total) by mouth every 8 (eight) hours as needed for pain. 02/14/23   Aberman, Caroline C, PA-C  ibuprofen  (ADVIL ) 600 MG tablet Take 1 tablet (600 mg total) by mouth every 6 (six) hours as needed. Patient taking differently: Take 600 mg by mouth every 6 (six) hours as needed for mild pain. 12/24/22   Harris, Abigail, PA-C  mupirocin  cream (BACTROBAN ) 2 % Apply 1 Application topically 2 (two) times daily. 09/23/24   Nivia Colon, PA-C  naproxen  (NAPROSYN ) 375 MG tablet Take 1 tablet (375 mg total) by mouth 2 (two) times daily. 02/14/23   Kommor, Madison, MD  risperiDONE  (RISPERDAL ) 1 MG tablet Take 1 tablet (1 mg total) by mouth 2 (two) times daily. 09/17/24 10/17/24  Jerrol Agent, MD  amantadine  (SYMMETREL ) 100 MG capsule Take 1 capsule (100 mg total) by mouth 2 (two) times daily. Patient not taking: Reported on 11/04/2019 09/01/19 11/17/19  [name], NP    Allergies: Patient has no known allergies.    Review of Systems  All other systems reviewed and are negative.   Updated Vital Signs Ht 5' 10 (1.778 m)   Wt 99 kg   BMI 31.32 kg/m   Physical Exam Vitals and nursing note reviewed.  Constitutional:      General: He is not in  acute distress.    Appearance: Normal appearance.  HENT:     Head: Normocephalic and atraumatic.  Eyes:     General:        Right eye: No discharge.        Left eye: No discharge.  Cardiovascular:     Rate and Rhythm: Normal rate and regular rhythm.  Pulmonary:     Effort: Pulmonary effort is normal. No respiratory distress.  Musculoskeletal:        General: No deformity.  Skin:    General: Skin is warm and dry.     Comments: Scattered excoriation to bilateral upper extremities with no evidence of secondary infection at this time.  Neurological:     Mental Status: He is alert and oriented to person, place, and time.  Psychiatric:        Mood and Affect: Mood normal.        Behavior: Behavior normal.     (all labs ordered are listed, but only abnormal results are displayed) Labs Reviewed - No data to display  EKG: None  Radiology: No results found.   Procedures   Medications Ordered in the ED  risperiDONE  (RISPERDAL ) tablet 1 mg (1 mg Oral Given 09/24/24 0833)  Medical Decision Making   Patient here requesting for a dose of his Risperdone.  He does not have any other complaint.  He has been seen and evaluated multiple times for same.  Last time that he was seen  for this in the ER was 09/23/2024.[name]  Patient also requesting for cream for bilateral arm excoriations.  Rash is itchy and ongoing for months.  No fever, no other complaint.   Patient resting comfortably appears to be in no acute discomfort.  EMR reviewed patient has been seen and evaluated multiple time for same.  Will give 1 dose of Risperdal  1 mg p.o. he is also requesting bacitracin  for chronic excoriations on bilateral arms.  He has been scratching at it.  This is an ongoing issue.  No obvious signs of erythema, warmth, secondary cellulitis but patient does have some scabbed lesion to the skin.  Will provide bacitracin  cream.  Patient discharged in stable condition at this  time.    Final diagnoses:  Medication refill    ED Discharge Orders     None          Rosan Sherlean DEL, [place] 09/24/24 9162    Bernard Drivers, MD 09/25/24 [phone]

## 2024-09-24 NOTE — Discharge Instructions (Addendum)
 Please take your home risperidone , use your home mupirocin  cream, return to the emergency department only if you are not able to pick up your prescriptions, or you have a new complaint.

## 2024-09-27 ENCOUNTER — Other Ambulatory Visit: Payer: Self-pay

## 2024-09-27 ENCOUNTER — Encounter (HOSPITAL_COMMUNITY): Payer: Self-pay

## 2024-09-27 ENCOUNTER — Emergency Department (HOSPITAL_COMMUNITY)
Admission: EM | Admit: 2024-09-27 | Discharge: 2024-09-27 | Disposition: A | Discharge status: Home / Self Care | Payer: Self-pay | Attending: Emergency Medicine | Admitting: Emergency Medicine

## 2024-09-27 DIAGNOSIS — Z760 Encounter for issue of repeat prescription: Secondary | ICD-10-CM | POA: Insufficient documentation

## 2024-09-27 DIAGNOSIS — R443 Hallucinations, unspecified: Secondary | ICD-10-CM | POA: Insufficient documentation

## 2024-09-27 MED ORDER — BACITRACIN ZINC 500 UNIT/GM EX OINT
TOPICAL_OINTMENT | Freq: Two times a day (BID) | CUTANEOUS | Status: DC
  Administered 2024-09-27: 1 via TOPICAL
  Filled 2024-09-27: qty 0.9

## 2024-09-27 MED ORDER — RISPERIDONE 1 MG PO TABS
1.0000 mg | ORAL_TABLET | Freq: Two times a day (BID) | ORAL | Status: DC
Start: 2024-09-27 — End: 2024-09-27
  Administered 2024-09-27: 1 mg via ORAL
  Filled 2024-09-27 (×3): qty 1

## 2024-09-27 NOTE — ED Provider Notes (Signed)
  [facility] Emergency Department Provider Note MRN:  [id number]  Arrival date & time: 09/27/24     Chief Complaint   Medication Refill   History of Present Illness   Cole Barrett is a 38 y.o. year-old male presents to the ED with chief complaint of needing a dose of his Risperdal .  States that he has been having hallucinations and is trying to get his head straight.  He denies any recent illnesses.  States that he does have 1 other complaint consisting of small sore/abrasion to his right forearm and is requesting some bacitracin  for this.  He denies fevers or chills.  Denies any other associated symptoms tonight..  History provided by patient.   Review of Systems  Pertinent positive and negative review of systems noted in HPI.    Physical Exam   Vitals:   09/27/24 0030  BP: (!) 135/93  Pulse: 68  Resp: 14  SpO2: 95%    CONSTITUTIONAL:  non toxic-appearing, NAD NEURO:  Alert and oriented x 3, CN 3-12 grossly intact EYES:  eyes equal and reactive ENT/NECK:  Supple, no stridor  CARDIO:  normal rate, appears well-perfused  PULM:  No respiratory distress,  GI/GU:  non-distended,  MSK/SPINE:  No gross deformities, no edema, moves all extremities  SKIN:  no rash, small sore on right forearm, no erythema or evidence of abscess   *Additional and/or pertinent findings included in MDM below  Diagnostic and Interventional Summary    EKG Interpretation Date/Time:    Ventricular Rate:    PR Interval:    QRS Duration:    QT Interval:    QTC Calculation:   R Axis:      Text Interpretation:         Labs Reviewed - No data to display  No orders to display    Medications  risperiDONE  (RISPERDAL ) tablet 1 mg (1 mg Oral Given 09/27/24 0458)  bacitracin  ointment (1 Application Topical Given 09/27/24 0459)     Procedures  /  Critical Care Procedures  ED Course and Medical Decision Making  I have reviewed the triage vital signs, the  nursing notes, and pertinent available records from the EMR.  Social Determinants Affecting Complexity of Care: Patient has no clinically significant social determinants affecting this chief complaint..   ED Course:    Medical Decision Making Patient here requesting a dose of his Risperdal .  He is in his normal state of health and is well known to me and   Risk OTC drugs. Prescription drug management.         Consultants: No consultations were needed in caring for this patient.   Treatment and Plan: Emergency department workup does not suggest an emergent condition requiring admission or immediate intervention beyond  what has been performed at this time. The patient is safe for discharge and has  been instructed to return immediately for worsening symptoms, change in  symptoms or any other concerns    Final Clinical Impressions(s) / ED Diagnoses     ICD-10-CM   1. Medication refill  Z76.0       ED Discharge Orders     None         Discharge Instructions Discussed with and Provided to Patient:   Discharge Instructions   None      Vicky Charleston, PA-C 09/27/24 9394    Jerral Meth, MD 09/27/24 [phone]

## 2024-09-27 NOTE — ED Triage Notes (Signed)
 Pt came in via POV requesting a refill of his Risperidone .  Denies any pain. A/O4

## 2024-09-28 ENCOUNTER — Emergency Department (HOSPITAL_COMMUNITY)
Admission: EM | Admit: 2024-09-28 | Discharge: 2024-09-29 | Disposition: A | Payer: Self-pay | Attending: Emergency Medicine | Admitting: Emergency Medicine

## 2024-09-28 ENCOUNTER — Encounter (HOSPITAL_COMMUNITY): Payer: Self-pay | Admitting: *Deleted

## 2024-09-28 ENCOUNTER — Other Ambulatory Visit: Payer: Self-pay

## 2024-09-28 DIAGNOSIS — F17210 Nicotine dependence, cigarettes, uncomplicated: Secondary | ICD-10-CM | POA: Insufficient documentation

## 2024-09-28 DIAGNOSIS — Z760 Encounter for issue of repeat prescription: Secondary | ICD-10-CM | POA: Insufficient documentation

## 2024-09-28 DIAGNOSIS — Z8616 Personal history of COVID-19: Secondary | ICD-10-CM | POA: Insufficient documentation

## 2024-09-28 DIAGNOSIS — Z5900 Homelessness unspecified: Secondary | ICD-10-CM | POA: Insufficient documentation

## 2024-09-28 NOTE — ED Triage Notes (Signed)
 Patient here requesting risperidone  and antibiotic ointment.

## 2024-09-29 MED ORDER — MUPIROCIN CALCIUM 2 % EX CREA
1.0000 | TOPICAL_CREAM | Freq: Once | CUTANEOUS | Status: AC
  Administered 2024-09-29: 1 via TOPICAL
  Filled 2024-09-29: qty 15

## 2024-09-29 MED ORDER — RISPERIDONE 1 MG PO TABS
1.0000 mg | ORAL_TABLET | Freq: Once | ORAL | Status: AC
  Administered 2024-09-29: 1 mg via ORAL
  Filled 2024-09-29: qty 1

## 2024-09-29 NOTE — Discharge Instructions (Signed)
 You were evaluated in the Emergency Department and after careful evaluation, we did not find any emergent condition requiring admission or further testing in the hospital.  Your exam/testing today is overall reassuring.  Take your medications.  Please return to the Emergency Department if you experience any worsening of your condition.   Thank you for allowing us  to be a part of your care.

## 2024-09-29 NOTE — ED Notes (Signed)
 Discharge instructions reviewed.   Opportunity for questions and concerns provided.   Alert, oriented and ambulatory.   Displays no signs of distress.

## 2024-09-29 NOTE — ED Provider Notes (Signed)
 [facility] Emergency Department Provider Note MRN:  [id number]  Arrival date & time: 09/29/24     Chief Complaint   Medication refill History of Present Illness   Cole Barrett is a 38 y.o. year-old male with a history of schizophrenia presenting to the ED with chief complaint of medication refill.  Patient would like a dose of his Risperdal  and some of his antibiotic cream.  He has no other complaints.  Review of Systems  A thorough review of systems was obtained and all systems are negative except as noted in the HPI and PMH.   Patient's Health History    Past Medical History:  Diagnosis Date   COVID-19    Hallucination    Homelessness    Hypercholesterolemia    Malingering    Mental disorder    Paranoid behavior (HCC)    PE (pulmonary embolism)    Schizophrenia (HCC)     History reviewed. No pertinent surgical history.  Family History  Problem Relation Age of Onset   Hypertension Mother     Social History   Socioeconomic History   Marital status: Single    Spouse name: Not on file   Number of children: Not on file   Years of education: Not on file   Highest education level: Not on file  Occupational History   Occupation: Employed    Comment: [employer]  Tobacco Use   Smoking status: Every Day    Current packs/day: 0.50    Types: Cigarettes   Smokeless tobacco: Never  Vaping Use   Vaping status: Never Used  Substance and Sexual Activity   Alcohol use: Not Currently    Comment: occ   Drug use: Not Currently    Types: Marijuana    Comment: Last used: 2 months ago UDS NA   Sexual activity: Never  Other Topics Concern   Not on file  Social History Narrative   ** Merged History Encounter **    Pt lives alone in [place].  He is employed.  He stated that he is followed by [facility], but that he has not been there in a while.   Social Drivers of Corporate Investment Banker Strain: Not on file  Food Insecurity:  Not on file  Transportation Needs: Not on file  Physical Activity: Not on file  Stress: Not on file  Social Connections: Not on file  Intimate Partner Violence: Not on file     Physical Exam   Vitals:   09/28/24 2036 09/29/24 0043  BP: 125/78 124/77  Pulse: 80 79  Resp: 16 18  Temp: 98.2 F (36.8 C) 98.9 F (37.2 C)  SpO2: 95% 94%    CONSTITUTIONAL: Well-appearing, NAD NEURO/PSYCH:  Alert and oriented x 3, no focal deficits EYES:  eyes equal and reactive ENT/NECK:  no LAD, no JVD CARDIO: Regular rate, well-perfused, normal S1 and S2 PULM:  CTAB no wheezing or rhonchi GI/GU:  non-distended, non-tender MSK/SPINE:  No gross deformities, no edema SKIN:  no rash, atraumatic   *Additional and/or pertinent findings included in MDM below  Diagnostic and Interventional Summary    EKG Interpretation Date/Time:    Ventricular Rate:    PR Interval:    QRS Duration:    QT Interval:    QTC Calculation:   R Axis:      Text Interpretation:         Labs Reviewed - No data to display  No orders to display  Medications  risperiDONE  (RISPERDAL ) tablet 1 mg (has no administration in time range)  mupirocin  cream (BACTROBAN ) 2 % 1 Application (has no administration in time range)     Procedures  /  Critical Care Procedures  ED Course and Medical Decision Making  Initial Impression and Ddx Patient here very frequently for the same request, does not have any physical complaints.  Past medical/surgical history that increases complexity of ED encounter: Schizophrenia  Interpretation of Diagnostics Laboratory and/or imaging options to aid in the diagnosis/care of the patient were considered.  After careful history and physical examination, it was determined that there was no indication for diagnostics at this time.  Patient Reassessment and Ultimate Disposition/Management     Discharge  Patient management required discussion with the following services or consulting  groups:  None  Complexity of Problems Addressed Acute complicated illness or Injury  Additional Data Reviewed and Analyzed Further history obtained from: None  Additional Factors Impacting ED Encounter Risk None  [name], MD [facility] Emergency Medicine [facility] [email or web address]  Final Clinical Impressions(s) / ED Diagnoses     ICD-10-CM   1. Medication refill  Z76.0       ED Discharge Orders     None        Discharge Instructions Discussed with and Provided to Patient:    Discharge Instructions      You were evaluated in the Emergency Department and after careful evaluation, we did not find any emergent condition requiring admission or further testing in the hospital.  Your exam/testing today is overall reassuring.  Take your medications.  Please return to the Emergency Department if you experience any worsening of your condition.   Thank you for allowing us  to be a part of your care.      Theadore Ozell HERO, MD 09/29/24 [phone]

## 2024-10-04 ENCOUNTER — Emergency Department (HOSPITAL_COMMUNITY)
Admission: EM | Admit: 2024-10-04 | Discharge: 2024-10-05 | Disposition: A | Discharge status: Home / Self Care | Payer: Self-pay | Attending: Emergency Medicine | Admitting: Emergency Medicine

## 2024-10-04 ENCOUNTER — Encounter (HOSPITAL_COMMUNITY): Payer: Self-pay | Admitting: *Deleted

## 2024-10-04 ENCOUNTER — Other Ambulatory Visit: Payer: Self-pay

## 2024-10-04 DIAGNOSIS — Z760 Encounter for issue of repeat prescription: Secondary | ICD-10-CM | POA: Insufficient documentation

## 2024-10-04 DIAGNOSIS — Z5900 Homelessness unspecified: Secondary | ICD-10-CM | POA: Insufficient documentation

## 2024-10-04 NOTE — ED Triage Notes (Signed)
 The pt wants to get some type med

## 2024-10-04 NOTE — ED Triage Notes (Signed)
 Patient here for his risperidone  and some cream

## 2024-10-05 MED ORDER — RISPERIDONE 1 MG PO TABS
1.0000 mg | ORAL_TABLET | Freq: Once | ORAL | Status: AC
  Administered 2024-10-05: 1 mg via ORAL
  Filled 2024-10-05 (×2): qty 1

## 2024-10-05 NOTE — Discharge Instructions (Signed)
 Please pick up your prescriptions at your pharmacy, follow-up with your primary care doctor.

## 2024-10-05 NOTE — ED Provider Notes (Signed)
 [place] EMERGENCY DEPARTMENT AT [facility] Provider Note   CSN: [id number] Arrival date & time: 10/04/24  1621     Patient presents with: No chief complaint on file.   Cole Barrett is a 38 y.o. male with past medical history sniffer homelessness, schizophrenia, previous PE, hyperlipidemia who presents with concern for medication refill.  Patient is seen frequently, normally to receive one-time dose of risperidone .  He has no other complaints today.  Also has some scattered excoriations on bilateral arms and requesting some bacitracin .    HPI     Prior to Admission medications   Medication Sig Start Date End Date Taking? Authorizing Provider  acetaminophen  (TYLENOL  8 HOUR) 650 MG CR tablet Take 1 tablet (650 mg total) by mouth every 8 (eight) hours as needed for pain. 02/14/23   Aberman, Caroline C, PA-C  ibuprofen  (ADVIL ) 600 MG tablet Take 1 tablet (600 mg total) by mouth every 6 (six) hours as needed. Patient taking differently: Take 600 mg by mouth every 6 (six) hours as needed for mild pain. 12/24/22   Harris, Abigail, PA-C  mupirocin  cream (BACTROBAN ) 2 % Apply 1 Application topically 2 (two) times daily. 09/23/24   Nivia Colon, PA-C  naproxen  (NAPROSYN ) 375 MG tablet Take 1 tablet (375 mg total) by mouth 2 (two) times daily. 02/14/23   Kommor, Madison, MD  risperiDONE  (RISPERDAL ) 1 MG tablet Take 1 tablet (1 mg total) by mouth 2 (two) times daily. 09/17/24 10/17/24  Jerrol Agent, MD  amantadine  (SYMMETREL ) 100 MG capsule Take 1 capsule (100 mg total) by mouth 2 (two) times daily. Patient not taking: Reported on 11/04/2019 09/01/19 11/17/19  [name], NP    Allergies: Patient has no known allergies.    Review of Systems  Updated Vital Signs BP 124/79 (BP Location: Left Arm)   Pulse 67   Temp 98.1 F (36.7 C) (Oral)   Resp 16   Ht 5' 10 (1.778 m)   Wt 99.8 kg   SpO2 100%   BMI 31.57 kg/m   Physical Exam  (all labs ordered are listed,  but only abnormal results are displayed) Labs Reviewed - No data to display  EKG: None  Radiology: No results found.   Procedures   Medications Ordered in the ED  risperiDONE  (RISPERDAL ) tablet 1 mg (has no administration in time range)                                    Medical Decision Making Risk Prescription drug management.    Patient here requesting for a dose of his Risperdone.  He does not have any other complaint.  He has been seen and evaluated multiple times for same.  Last time that he was seen  for this in the ER was 09/29/2024.[name]  Patient also requesting for cream for bilateral arm excoriations.  Rash is itchy and ongoing for months.  No fever, no other complaint.   Patient resting comfortably appears to be in no acute discomfort.  EMR reviewed patient has been seen and evaluated multiple time for same.  Will give 1 dose of Risperdal  1 mg p.o. he is also requesting bacitracin  for chronic excoriations on bilateral arms.  He has been scratching at it.  This is an ongoing issue.  No obvious signs of erythema, warmth, secondary cellulitis but patient does have some scabbed lesion to the skin.  Will provide bacitracin  cream.  Patient discharged in stable condition at this time.  Final diagnoses:  None    ED Discharge Orders     None          [name] 10/05/24 0744    [name], MD 10/05/24 1354

## 2024-10-06 ENCOUNTER — Other Ambulatory Visit: Payer: Self-pay

## 2024-10-06 ENCOUNTER — Encounter (HOSPITAL_COMMUNITY): Payer: Self-pay | Admitting: *Deleted

## 2024-10-06 ENCOUNTER — Emergency Department (HOSPITAL_COMMUNITY)
Admission: EM | Admit: 2024-10-06 | Discharge: 2024-10-06 | Disposition: A | Discharge status: Home / Self Care | Payer: Self-pay | Attending: Emergency Medicine | Admitting: Emergency Medicine

## 2024-10-06 DIAGNOSIS — Z760 Encounter for issue of repeat prescription: Secondary | ICD-10-CM | POA: Insufficient documentation

## 2024-10-06 DIAGNOSIS — Z5900 Homelessness unspecified: Secondary | ICD-10-CM | POA: Insufficient documentation

## 2024-10-06 MED ORDER — RISPERIDONE 1 MG PO TABS
1.0000 mg | ORAL_TABLET | Freq: Once | ORAL | Status: AC
  Administered 2024-10-06: 1 mg via ORAL
  Filled 2024-10-06: qty 1

## 2024-10-06 NOTE — ED Triage Notes (Signed)
 Wants med he's here for same complaint last pm

## 2024-10-06 NOTE — ED Provider Notes (Signed)
  [place] EMERGENCY DEPARTMENT AT [facility] Provider Note   CSN: [id number] Arrival date & time: 10/06/24  0202     Patient presents with: wants medication   Cole Barrett is a 38 y.o. male.   The history is provided by the patient.  Patient with history of schizophrenia presents for his medication.  Reports he needs to get his head right.  He is requesting Risperdal .  He has no other acute complaints    Past Medical History:  Diagnosis Date   COVID-19    Hallucination    Homelessness    Hypercholesterolemia    Malingering    Mental disorder    Paranoid behavior (HCC)    PE (pulmonary embolism)    Schizophrenia (HCC)     Prior to Admission medications   Medication Sig Start Date End Date Taking? Authorizing Provider  risperiDONE  (RISPERDAL ) 1 MG tablet Take 1 tablet (1 mg total) by mouth 2 (two) times daily. 09/17/24 10/17/24  Jerrol Agent, MD  amantadine  (SYMMETREL ) 100 MG capsule Take 1 capsule (100 mg total) by mouth 2 (two) times daily. Patient not taking: Reported on 11/04/2019 09/01/19 11/17/19  [name], NP    Allergies: Patient has no known allergies.    Review of Systems  Updated Vital Signs BP 137/76   Pulse 75   Temp (!) 97.5 F (36.4 C)   Resp 18   Ht 1.778 m (5' 10)   Wt 99.8 kg   SpO2 100%   BMI 31.57 kg/m   Physical Exam CONSTITUTIONAL: Disheveled, sleeping HEAD: Normocephalic/atraumatic LUNGS:  no apparent distress NEURO: Pt is sleeping but is arousable, moves all 4 extremities EXTREMITIES:  full ROM SKIN: warm, color normal PSYCH: no abnormalities of mood noted, alert and oriented to situation  (all labs ordered are listed, but only abnormal results are displayed) Labs Reviewed - No data to display  EKG: None  Radiology: No results found.   Procedures   Medications Ordered in the ED  risperiDONE  (RISPERDAL ) tablet 1 mg (has no administration in time range)                                     Medical Decision Making Risk Prescription drug management.   Patient presents for his 40th ER visit in 6 months.  He is requesting Risperdal .  No signs of acute psychosis or other acute psychiatric emergency Patient safe for discharge     Final diagnoses:  Medication refill    ED Discharge Orders     None          Midge Golas, MD 10/06/24 0320

## 2024-10-16 ENCOUNTER — Emergency Department (HOSPITAL_COMMUNITY)
Admission: EM | Admit: 2024-10-16 | Discharge: 2024-10-16 | Disposition: A | Discharge status: Home / Self Care | Payer: Self-pay | Attending: Emergency Medicine | Admitting: Emergency Medicine

## 2024-10-16 ENCOUNTER — Other Ambulatory Visit: Payer: Self-pay

## 2024-10-16 DIAGNOSIS — Z5900 Homelessness unspecified: Secondary | ICD-10-CM | POA: Insufficient documentation

## 2024-10-16 DIAGNOSIS — Z760 Encounter for issue of repeat prescription: Secondary | ICD-10-CM | POA: Insufficient documentation

## 2024-10-16 MED ORDER — RISPERIDONE 1 MG PO TABS
1.0000 mg | ORAL_TABLET | Freq: Once | ORAL | Status: AC
  Administered 2024-10-16: 1 mg via ORAL
  Filled 2024-10-16: qty 1

## 2024-10-16 MED ORDER — BACITRACIN ZINC 500 UNIT/GM EX OINT: TOPICAL_OINTMENT | Freq: Two times a day (BID) | CUTANEOUS | Status: DC

## 2024-10-16 NOTE — ED Provider Notes (Signed)
 [place] EMERGENCY DEPARTMENT AT [facility] Provider Note   CSN: [id number] Arrival date & time: 10/16/24  8065     Patient presents with: Medication Refill   Cole Barrett is a 38 y.o. male past medical history significant for schizophrenia, and homelessness presents today for medication.  Patient requesting Risperdal  and bacitracin  ointment for chronic excoriations on his arms.  Patient is frequently seen for same and normally receives one-time dose of Risperdal  and bacitracin  for excoriations on his arms.  Patient denies fever, chills, nausea, vomiting, any other complaints at this time.    Medication Refill      Prior to Admission medications   Medication Sig Start Date End Date Taking? Authorizing Provider  risperiDONE  (RISPERDAL ) 1 MG tablet Take 1 tablet (1 mg total) by mouth 2 (two) times daily. 09/17/24 10/17/24  Jerrol Agent, MD  amantadine  (SYMMETREL ) 100 MG capsule Take 1 capsule (100 mg total) by mouth 2 (two) times daily. Patient not taking: Reported on 11/04/2019 09/01/19 11/17/19  [name], NP    Allergies: Patient has no known allergies.    Review of Systems  Updated Vital Signs BP 130/87 (BP Location: Right Arm)   Pulse 78   Temp 98.2 F (36.8 C)   Resp 20   Ht 5' 10 (1.778 m)   Wt 99.8 kg   SpO2 98%   BMI 31.57 kg/m   Physical Exam Vitals and nursing note reviewed.  Constitutional:      General: He is not in acute distress.    Appearance: He is well-developed. He is not toxic-appearing.  HENT:     Head: Normocephalic and atraumatic.     Right Ear: External ear normal.     Left Ear: External ear normal.     Mouth/Throat:     Mouth: Mucous membranes are moist.     Pharynx: Oropharynx is clear.  Eyes:     Conjunctiva/sclera: Conjunctivae normal.  Cardiovascular:     Rate and Rhythm: Normal rate and regular rhythm.     Pulses: Normal pulses.     Heart sounds: Normal heart sounds. No murmur heard. Pulmonary:      Effort: Pulmonary effort is normal. No respiratory distress.     Breath sounds: Normal breath sounds.  Abdominal:     Palpations: Abdomen is soft.     Tenderness: There is no abdominal tenderness.  Musculoskeletal:        General: No swelling.     Cervical back: Neck supple.  Skin:    General: Skin is warm and dry.     Capillary Refill: Capillary refill takes less than 2 seconds.     Findings: No erythema.     Comments: Chronic excoriations of bilateral arms.  No erythema, warmth, open wounds noted on exam.  Neurological:     General: No focal deficit present.     Mental Status: He is alert.     GCS: GCS eye subscore is 4. GCS verbal subscore is 5. GCS motor subscore is 6.  Psychiatric:        Attention and Perception: He does not perceive auditory or visual hallucinations.        Mood and Affect: Mood normal.        Behavior: Behavior is cooperative.     (all labs ordered are listed, but only abnormal results are displayed) Labs Reviewed - No data to display  EKG: None  Radiology: No results found.   Procedures   Medications Ordered in  the ED  bacitracin  ointment (has no administration in time range)  risperiDONE  (RISPERDAL ) tablet 1 mg (has no administration in time range)                                    Medical Decision Making  This patient presents to the ED for concern of medication refill, this involves an extensive number of treatment options, and is a complaint that carries with it a high risk of complications and morbidity.  The differential diagnosis includes medication refill, cellulitis, abscess, chronic wounds   Co morbidities / Chronic conditions that complicate the patient evaluation  Schizophrenia   Problem List / ED Course / Critical interventions / Medication management  I ordered medication including Risperdal  and bacitracin  I have reviewed the patients home medicines and have made adjustments as needed   Test / Admission -  Considered:  Considered for admission or further workup however patient's vital signs and physical exam are reassuring.  Patient is well-known to the ED for similar presentations.  Patient has no additional complaints at this time.  Patient has no signs or symptoms concerning for cellulitis or other acute infection.  Patient given one-time doses of risperidone  and bacitracin  ointments.  I feel patient safer discharge at this time.     Final diagnoses:  Medication refill    ED Discharge Orders     None          Cole Barrett 10/16/24 2009    Cole Sid LOISE, MD 10/16/24 2123

## 2024-10-16 NOTE — ED Triage Notes (Signed)
 Patient reports that he needs his Risperdal  and cream for a spot on his arm.

## 2024-10-16 NOTE — Discharge Instructions (Addendum)
 Today you were seen for medication refill.  Thank you for letting us  treat you today. After performing a physical exam, I feel you are safe to go home. Please follow up with your PCP in the next several days and provide them with your records from this visit. Return to the Emergency Room if pain becomes severe or symptoms worsen.

## 2024-10-24 ENCOUNTER — Emergency Department (HOSPITAL_COMMUNITY)
Admission: EM | Admit: 2024-10-24 | Discharge: 2024-10-25 | Disposition: A | Discharge status: Home / Self Care | Payer: Self-pay | Attending: Emergency Medicine | Admitting: Emergency Medicine

## 2024-10-24 ENCOUNTER — Other Ambulatory Visit: Payer: Self-pay

## 2024-10-24 DIAGNOSIS — Z760 Encounter for issue of repeat prescription: Secondary | ICD-10-CM | POA: Insufficient documentation

## 2024-10-24 DIAGNOSIS — Z7689 Persons encountering health services in other specified circumstances: Secondary | ICD-10-CM

## 2024-10-24 NOTE — ED Triage Notes (Signed)
 Pt came back from [employer] stating that he needs his Risperdal  and cream for his arm.

## 2024-10-24 NOTE — ED Notes (Signed)
 Pt called multiple times for triage. Pt not seen in lobby

## 2024-10-24 NOTE — ED Notes (Signed)
 Upon trying to call this pt for triage, staff was told that he went to [facility] after checking in.

## 2024-10-25 MED ORDER — RISPERIDONE 1 MG PO TABS
1.0000 mg | ORAL_TABLET | Freq: Once | ORAL | Status: AC
  Administered 2024-10-25: 1 mg via ORAL
  Filled 2024-10-25: qty 1

## 2024-10-25 NOTE — ED Provider Notes (Signed)
 [place] EMERGENCY DEPARTMENT AT [facility] Provider Note   CSN: [id number] Arrival date & time: 10/24/24  8086     Patient presents with: Medication Refill   Cole Barrett is a 38 y.o. male.   38 year old male presents to the emergency department for evaluation, requesting a dose of his Risperdal .  He has been seen frequently in the past for similar reasons.  He appears at his stated baseline.  He has no additional complaints.  The history is provided by the patient. No language interpreter was used.  Medication Refill      Prior to Admission medications   Medication Sig Start Date End Date Taking? Authorizing Provider  risperiDONE  (RISPERDAL ) 1 MG tablet Take 1 tablet (1 mg total) by mouth 2 (two) times daily. 09/17/24 10/17/24  Jerrol Agent, MD  amantadine  (SYMMETREL ) 100 MG capsule Take 1 capsule (100 mg total) by mouth 2 (two) times daily. Patient not taking: Reported on 11/04/2019 09/01/19 11/17/19  [name], NP    Allergies: Patient has no known allergies.    Review of Systems Ten systems reviewed and are negative for acute change, except as noted in the HPI.    Updated Vital Signs BP 122/79 (BP Location: Right Arm)   Pulse 88   Temp 97.9 F (36.6 C) (Oral)   Resp (!) 24   SpO2 98%   Physical Exam Vitals and nursing note reviewed.  Constitutional:      General: He is not in acute distress.    Appearance: He is well-developed. He is not diaphoretic.     Comments: Nontoxic appearing. Disheveled   HENT:     Head: Normocephalic and atraumatic.  Eyes:     General: No scleral icterus.    Conjunctiva/sclera: Conjunctivae normal.  Pulmonary:     Effort: Pulmonary effort is normal. No respiratory distress.     Comments: Respirations even and unlabored Musculoskeletal:        General: Normal range of motion.     Cervical back: Normal range of motion.  Skin:    General: Skin is warm and dry.     Coloration: Skin is not pale.      Findings: No erythema or rash.  Neurological:     Mental Status: He is alert and oriented to person, place, and time.     Comments: Ambulatory without issue.  Psychiatric:        Behavior: Behavior is cooperative.     Comments: Not reacting to internal stimuli.     (all labs ordered are listed, but only abnormal results are displayed) Labs Reviewed - No data to display  EKG: None  Radiology: No results found.   Procedures   Medications Ordered in the ED  risperiDONE  (RISPERDAL ) tablet 1 mg (has no administration in time range)                                    Medical Decision Making Risk Prescription drug management.    This patient presents to the ED for concern of needing his medication, this involves an extensive number of treatment options, and is a complaint that carries with it a high risk of complications and morbidity.      Co morbidities that complicate the patient evaluation   HLD Schizophrenia     Additional history obtained:   External records from outside source obtained and reviewed including prior discharge summaries.  Cardiac Monitoring:   The patient was maintained on a cardiac monitor.  I personally viewed and interpreted the cardiac monitored which showed an underlying rhythm of: NSR     Medicines ordered and prescription drug management:   I ordered medication including Risperdal  for medication compliance  I have reviewed the patients home medicines and have made adjustments as needed     Problem List / ED Course:   Given dose of Risperdal . No other acute complaints. At known baseline, in no apparent distress.     Reevaluation:   After the interventions noted above, I reevaluated the patient and found that they have: stayed the same     Social Determinants of Health:   Housing instability     Dispostion:   After consideration of the diagnostic results and the patients response to treatment, I feel that the patent  would benefit from outpatient psychiatric follow up. Resource guide given. Return precautions discussed and provided. Patient discharged in stable condition with no unaddressed concerns.      Final diagnoses:  Encounter for medication administration    ED Discharge Orders     None          Keith Sor, PA-C 10/25/24 0030    Trine Raynell Moder, MD 10/25/24 [phone]

## 2024-10-29 ENCOUNTER — Other Ambulatory Visit: Payer: Self-pay

## 2024-10-29 ENCOUNTER — Encounter (HOSPITAL_COMMUNITY): Payer: Self-pay

## 2024-10-29 ENCOUNTER — Emergency Department (HOSPITAL_COMMUNITY)
Admission: EM | Admit: 2024-10-29 | Discharge: 2024-10-30 | Disposition: A | Payer: Self-pay | Attending: Emergency Medicine | Admitting: Emergency Medicine

## 2024-10-29 DIAGNOSIS — Z760 Encounter for issue of repeat prescription: Secondary | ICD-10-CM | POA: Insufficient documentation

## 2024-10-29 NOTE — ED Triage Notes (Signed)
 Pt here for his risperidal and cream for his arm

## 2024-10-30 MED ORDER — BACITRACIN ZINC 500 UNIT/GM EX OINT
TOPICAL_OINTMENT | Freq: Two times a day (BID) | CUTANEOUS | Status: DC
  Administered 2024-10-30: 1 via TOPICAL
  Filled 2024-10-30: qty 0.9

## 2024-10-30 MED ORDER — RISPERIDONE 1 MG PO TABS
0.5000 mg | ORAL_TABLET | Freq: Once | ORAL | Status: AC
  Administered 2024-10-30: 0.5 mg via ORAL
  Filled 2024-10-30 (×2): qty 1

## 2024-10-30 NOTE — ED Provider Notes (Signed)
  [place]-[facility]ENC[facility] DEPT [facility] Emergency Department Provider Note MRN:  [id number]  Arrival date & time: 10/30/24     Chief Complaint   Medication Refill   History of Present Illness   Cole Barrett is a 38 y.o. year-old male presents to the ED with chief complaint of requesting medication refill.  States he needs some bacitracin  and risperdal .  No new complaints.  History provided by patient.   Review of Systems  Pertinent positive and negative review of systems noted in HPI.    Physical Exam   Vitals:   10/29/24 1958 10/29/24 2347  BP: 121/80 137/76  Pulse: 92 70  Resp: 18 18  Temp: 98.1 F (36.7 C) 98.1 F (36.7 C)  SpO2: 98% 100%    CONSTITUTIONAL:  non toxic-appearing, NAD NEURO:  Alert and oriented x 3, CN 3-12 grossly intact EYES:  eyes equal and reactive ENT/NECK:  Supple, no stridor  CARDIO:  normal rate, regular rhythm, appears well-perfused  PULM:  No respiratory distress,  GI/GU:  non-distended,  MSK/SPINE:  No gross deformities, no edema, moves all extremities  SKIN:  no rash, atraumatic   *Additional and/or pertinent findings included in MDM below  Diagnostic and Interventional Summary    EKG Interpretation Date/Time:    Ventricular Rate:    PR Interval:    QRS Duration:    QT Interval:    QTC Calculation:   R Axis:      Text Interpretation:         Labs Reviewed - No data to display  No orders to display    Medications  bacitracin  ointment (1 Application Topical Given 10/30/24 0340)  risperiDONE  (RISPERDAL ) tablet 0.5 mg (0.5 mg Oral Given 10/30/24 0345)     Procedures  /  Critical Care Procedures  ED Course and Medical Decision Making  I have reviewed the triage vital signs, the nursing notes, and pertinent available records from the EMR.  Social Determinants Affecting Complexity of Care: Patient has no clinically significant social determinants affecting this chief complaint..   ED Course:    Medical  Decision Making Risk OTC drugs. Prescription drug management.         Consultants: No consultations were needed in caring for this patient.   Treatment and Plan: Emergency department workup does not suggest an emergent condition requiring admission or immediate intervention beyond  what has been performed at this time. The patient is safe for discharge and has  been instructed to return immediately for worsening symptoms, change in  symptoms or any other concerns    Final Clinical Impressions(s) / ED Diagnoses     ICD-10-CM   1. Medication refill  Z76.0       ED Discharge Orders     None         Discharge Instructions Discussed with and Provided to Patient:   Discharge Instructions   None      Vicky Charleston, PA-C 10/30/24 0551    Haze Lonni PARAS, MD 10/31/24 [phone]

## 2024-10-31 ENCOUNTER — Emergency Department (HOSPITAL_COMMUNITY)
Admission: EM | Admit: 2024-10-31 | Discharge: 2024-11-01 | Disposition: A | Payer: Self-pay | Attending: Emergency Medicine | Admitting: Emergency Medicine

## 2024-10-31 ENCOUNTER — Other Ambulatory Visit: Payer: Self-pay

## 2024-10-31 ENCOUNTER — Encounter (HOSPITAL_COMMUNITY): Payer: Self-pay | Admitting: Emergency Medicine

## 2024-10-31 DIAGNOSIS — Z91148 Patient's other noncompliance with medication regimen for other reason: Secondary | ICD-10-CM

## 2024-10-31 DIAGNOSIS — Z760 Encounter for issue of repeat prescription: Secondary | ICD-10-CM | POA: Insufficient documentation

## 2024-10-31 DIAGNOSIS — Z5900 Homelessness unspecified: Secondary | ICD-10-CM

## 2024-10-31 NOTE — ED Triage Notes (Signed)
 Pt tells registration that he is here for risperdal  refill and walks away before I can speak with him. Attempt to call and find him in lobby so far unsuccessful

## 2024-11-01 MED ORDER — RISPERIDONE 1 MG PO TABS
1.0000 mg | ORAL_TABLET | Freq: Once | ORAL | Status: AC
  Administered 2024-11-01: 1 mg via ORAL
  Filled 2024-11-01 (×2): qty 1

## 2024-11-01 NOTE — ED Provider Notes (Signed)
  [place] EMERGENCY DEPARTMENT AT [facility] Provider Note   CSN: [id number] Arrival date & time: 10/31/24  1813     Patient presents with: Medication Refill   Cole Barrett is a 38 y.o. male.   38 year old male well-known to this department for noncompliance, medication refills and psychiatric disorder.  Patient is at his baseline.  States he needs his Risperdal .  Also asked for some bacitracin  as he has a scar and wants to use it to help it heal.  He is homeless.  No other complaints.   Medication Refill      Prior to Admission medications   Medication Sig Start Date End Date Taking? Authorizing Provider  risperiDONE  (RISPERDAL ) 1 MG tablet Take 1 tablet (1 mg total) by mouth 2 (two) times daily. 09/17/24 10/17/24  Jerrol Agent, MD  amantadine  (SYMMETREL ) 100 MG capsule Take 1 capsule (100 mg total) by mouth 2 (two) times daily. Patient not taking: Reported on 11/04/2019 09/01/19 11/17/19  [name], NP    Allergies: Patient has no known allergies.    Review of Systems  Updated Vital Signs Ht 5' 10 (1.778 m)   Wt 100 kg   BMI 31.63 kg/m   Physical Exam Vitals and nursing note reviewed.  Constitutional:      Appearance: He is well-developed.     Comments: Disheveled per his usual  HENT:     Head: Normocephalic and atraumatic.  Cardiovascular:     Rate and Rhythm: Normal rate.  Pulmonary:     Effort: Pulmonary effort is normal. No respiratory distress.  Abdominal:     General: There is no distension.  Musculoskeletal:        General: Normal range of motion.     Cervical back: Normal range of motion.  Neurological:     General: No focal deficit present.     Mental Status: He is alert.  Psychiatric:     Comments: Baseline: Withdrawn, slow to answer     (all labs ordered are listed, but only abnormal results are displayed) Labs Reviewed - No data to display  EKG: None  Radiology: No results found.   Procedures    Medications Ordered in the ED  risperiDONE  (RISPERDAL ) tablet 1 mg (has no administration in time range)                                    Medical Decision Making Risk Prescription drug management.   38 year old male here for Risperdal  and bacitracin .  Provided both.  No emergent complaints identified.     Final diagnoses:  None    ED Discharge Orders     None          Hillary Schwegler, Selinda, MD 11/01/24 [phone]

## 2024-11-02 ENCOUNTER — Other Ambulatory Visit: Payer: Self-pay

## 2024-11-02 ENCOUNTER — Emergency Department (HOSPITAL_COMMUNITY)
Admission: EM | Admit: 2024-11-02 | Discharge: 2024-11-02 | Disposition: A | Discharge status: Home / Self Care | Payer: Self-pay

## 2024-11-02 DIAGNOSIS — Z5900 Homelessness unspecified: Secondary | ICD-10-CM | POA: Insufficient documentation

## 2024-11-02 DIAGNOSIS — Z760 Encounter for issue of repeat prescription: Secondary | ICD-10-CM | POA: Insufficient documentation

## 2024-11-02 MED ORDER — RISPERIDONE 1 MG PO TABS
1.0000 mg | ORAL_TABLET | Freq: Once | ORAL | Status: AC
  Administered 2024-11-02: 1 mg via ORAL
  Filled 2024-11-02: qty 1

## 2024-11-02 MED ORDER — RISPERIDONE 1 MG PO TABS: 1.0000 mg | ORAL_TABLET | Freq: Every day | ORAL | 0 refills | Status: AC

## 2024-11-02 MED ORDER — BACITRACIN ZINC 500 UNIT/GM EX OINT: 1.0000 | TOPICAL_OINTMENT | Freq: Two times a day (BID) | CUTANEOUS | 0 refills | Status: AC

## 2024-11-02 NOTE — ED Triage Notes (Signed)
 PT would like a refill of Risperdal  and some bacitracin  ointment to put on the scars on his arms.

## 2024-11-02 NOTE — ED Provider Notes (Signed)
[place] EMERGENCY DEPARTMENT AT [facility] Provider Note   CSN: [id number] Arrival date & time: 11/02/24  1743     Patient presents with: Medication Refill   Cole Barrett is a 38 y.o. male.   38 year old male with past medical history of homelessness and bipolar disorder on respite all presenting to the emergency department today requesting a dose of his Risperdal .  Patient is also requesting a refill on bacitracin  prescription.  Appears that he is presenting multiple days in a row for this.  The patient is homeless which does complicate his care.  He denies any other complaints at this time.   Medication Refill      Prior to Admission medications   Medication Sig Start Date End Date Taking? Authorizing Provider  bacitracin  ointment Apply 1 Application topically 2 (two) times daily. 11/02/24  Yes Ula Prentice SAUNDERS, MD  risperiDONE  (RISPERDAL ) 1 MG tablet Take 1 tablet (1 mg total) by mouth at bedtime. 11/02/24  Yes Ula Prentice SAUNDERS, MD  risperiDONE  (RISPERDAL ) 1 MG tablet Take 1 tablet (1 mg total) by mouth 2 (two) times daily. 09/17/24 10/17/24  Jerrol Agent, MD  amantadine  (SYMMETREL ) 100 MG capsule Take 1 capsule (100 mg total) by mouth 2 (two) times daily. Patient not taking: Reported on 11/04/2019 09/01/19 11/17/19  [name], NP    Allergies: Patient has no known allergies.    Review of Systems  All other systems reviewed and are negative.   Updated Vital Signs BP (!) 133/94 (BP Location: Right Arm)   Pulse 71   Temp (!) 97.5 F (36.4 C)   Resp 18   Ht 5' 10 (1.778 m)   Wt 100 kg   SpO2 100%   BMI 31.63 kg/m   Physical Exam Vitals and nursing note reviewed.   Gen: NAD Eyes: PERRL, EOMI HEENT: no oropharyngeal swelling Neck: trachea midline Resp: clear to auscultation bilaterally Card: RRR, no murmurs, rubs, or gallops Abd: nontender, nondistended Extremities: no calf tenderness, no edema Vascular: 2+ radial pulses bilaterally,  2+ DP pulses bilaterally Skin: no rashes Psyc: acting appropriately   (all labs ordered are listed, but only abnormal results are displayed) Labs Reviewed - No data to display  EKG: None  Radiology: No results found.   Procedures   Medications Ordered in the ED  risperiDONE  (RISPERDAL ) tablet 1 mg (has no administration in time range)                                    Medical Decision Making 38 year old male with past medical history of bipolar disorder and homelessness presenting to the emergency department today requesting refills on his medications.  Will give him a dose of his Risperdal  here.  I think is stable for discharge after.  He will be discharged with return precautions.  Vital signs are stable he denies any other acute complaints tonight.  Risk Prescription drug management.        Final diagnoses:  Medication refill    ED Discharge Orders          Ordered    risperiDONE  (RISPERDAL ) 1 MG tablet  Daily at bedtime        11/02/24 1944    bacitracin  ointment  2 times daily        11/02/24 1944               Ula Prentice SAUNDERS, MD  11/02/24 1944  

## 2024-11-02 NOTE — Discharge Instructions (Addendum)
 Please take your medications as prescribed and return to the ER for worsening symptoms.

## 2024-11-06 ENCOUNTER — Other Ambulatory Visit: Payer: Self-pay

## 2024-11-06 ENCOUNTER — Emergency Department (HOSPITAL_COMMUNITY)
Admission: EM | Admit: 2024-11-06 | Discharge: 2024-11-06 | Disposition: A | Discharge status: Home / Self Care | Payer: Self-pay | Attending: Physician Assistant | Admitting: Physician Assistant

## 2024-11-06 ENCOUNTER — Encounter (HOSPITAL_COMMUNITY): Payer: Self-pay | Admitting: Emergency Medicine

## 2024-11-06 DIAGNOSIS — F209 Schizophrenia, unspecified: Secondary | ICD-10-CM | POA: Insufficient documentation

## 2024-11-06 DIAGNOSIS — Z760 Encounter for issue of repeat prescription: Secondary | ICD-10-CM | POA: Insufficient documentation

## 2024-11-06 MED ORDER — BACITRACIN ZINC 500 UNIT/GM EX OINT: TOPICAL_OINTMENT | Freq: Two times a day (BID) | CUTANEOUS | Status: DC

## 2024-11-06 MED ORDER — RISPERIDONE 1 MG PO TBDP
1.0000 mg | ORAL_TABLET | Freq: Once | ORAL | Status: AC
  Administered 2024-11-06: 1 mg via ORAL
  Filled 2024-11-06: qty 1

## 2024-11-06 NOTE — ED Triage Notes (Signed)
Patient here for his Risperdal.

## 2024-11-06 NOTE — ED Triage Notes (Signed)
Called for triage x1-no answer. 

## 2024-11-06 NOTE — ED Provider Notes (Signed)
 [place] EMERGENCY DEPARTMENT AT [facility] Provider Note   CSN: [id number] Arrival date & time: 11/06/24  0401     Patient presents with: Medication Refill   Cole Barrett is a 38 y.o. male.   38 year old male with a past medical history of mental disorder, paranoia, schizophrenia presents to the ED requesting Risperdal  medicine.  According to records he is currently prescribed 1 mg of Risperdal  twice daily, he has been coming to the emergency department in order to obtain this medicine.  He is also requesting bacitracin  to help apply to some of his skin along with some sandwiches.  Denies any other concern.   Medication Refill      Prior to Admission medications   Medication Sig Start Date End Date Taking? Authorizing Provider  bacitracin  ointment Apply 1 Application topically 2 (two) times daily. 11/02/24   Ula Prentice SAUNDERS, MD  risperiDONE  (RISPERDAL ) 1 MG tablet Take 1 tablet (1 mg total) by mouth 2 (two) times daily. 09/17/24 10/17/24  Jerrol Agent, MD  risperiDONE  (RISPERDAL ) 1 MG tablet Take 1 tablet (1 mg total) by mouth at bedtime. 11/02/24   Ula Prentice SAUNDERS, MD  amantadine  (SYMMETREL ) 100 MG capsule Take 1 capsule (100 mg total) by mouth 2 (two) times daily. Patient not taking: Reported on 11/04/2019 09/01/19 11/17/19  [name], NP    Allergies: Patient has no known allergies.    Review of Systems  Constitutional:  Negative for fever.  Skin:  Negative for wound.  Psychiatric/Behavioral:  Negative for dysphoric mood, hallucinations, self-injury, sleep disturbance and suicidal ideas. The patient is not nervous/anxious and is not hyperactive.     Updated Vital Signs BP 126/86   Pulse 72   Temp (!) 97.5 F (36.4 C) (Oral)   Resp 15   Ht 5' 10 (1.778 m)   Wt 100 kg   SpO2 100%   BMI 31.63 kg/m   Physical Exam Vitals and nursing note reviewed.  Constitutional:      Appearance: Normal appearance.  HENT:     Head: Normocephalic.      Mouth/Throat:     Mouth: Mucous membranes are moist.  Cardiovascular:     Rate and Rhythm: Normal rate.  Pulmonary:     Effort: Pulmonary effort is normal.  Abdominal:     General: Abdomen is flat.  Musculoskeletal:     Cervical back: Normal range of motion and neck supple.  Skin:    General: Skin is warm and dry.  Neurological:     Mental Status: He is alert and oriented to person, place, and time.     (all labs ordered are listed, but only abnormal results are displayed) Labs Reviewed - No data to display  EKG: None  Radiology: No results found.   Procedures   Medications Ordered in the ED  bacitracin  ointment (has no administration in time range)  risperiDONE  (RISPERDAL  M-TABS) disintegrating tablet 1 mg (1 mg Oral Given 11/06/24 0808)                                    Medical Decision Making Risk OTC drugs. Prescription drug management.    Patient presented to the ED requesting medication refill, according to prior records it does look like he is on Risperdal  1 mg twice daily.  He does come into the emergency department in order to obtain his dose of Risperdal .  He is  unable to afford a refill therefore not an actual medication was given to him.  He is requesting bacitracin  along with some food.  Denies any other complaint at this time.  Patient stable for discharge.    Portions of this note were generated with Scientist, clinical (histocompatibility and immunogenetics). Dictation errors may occur despite best attempts at proofreading.   Final diagnoses:  Medication refill    ED Discharge Orders     None          Maureen Broad, PA-C 11/06/24 0813    Garrick Charleston, MD 11/09/24 2218

## 2024-11-07 ENCOUNTER — Encounter (HOSPITAL_COMMUNITY): Payer: Self-pay | Admitting: Emergency Medicine

## 2024-11-07 ENCOUNTER — Emergency Department (HOSPITAL_COMMUNITY)
Admission: EM | Admit: 2024-11-07 | Discharge: 2024-11-07 | Disposition: A | Discharge status: Home / Self Care | Payer: Self-pay

## 2024-11-07 ENCOUNTER — Other Ambulatory Visit: Payer: Self-pay

## 2024-11-07 DIAGNOSIS — Z760 Encounter for issue of repeat prescription: Secondary | ICD-10-CM | POA: Insufficient documentation

## 2024-11-07 MED ORDER — BACITRACIN ZINC 500 UNIT/GM EX OINT
TOPICAL_OINTMENT | Freq: Two times a day (BID) | CUTANEOUS | Status: DC
  Administered 2024-11-07: 1 via TOPICAL
  Filled 2024-11-07: qty 2.7

## 2024-11-07 MED ORDER — RISPERIDONE 1 MG PO TABS
1.0000 mg | ORAL_TABLET | Freq: Once | ORAL | Status: AC
  Administered 2024-11-07: 1 mg via ORAL
  Filled 2024-11-07: qty 1

## 2024-11-07 NOTE — ED Notes (Signed)
 Pt called for triage x2 with no answer

## 2024-11-07 NOTE — ED Triage Notes (Signed)
 Quick triage note: pt to ED requesting his Risperdal 

## 2024-11-07 NOTE — Discharge Instructions (Signed)
 Follow-up with your doctor as needed.

## 2024-11-07 NOTE — ED Provider Notes (Signed)
°  [place] EMERGENCY DEPARTMENT AT [facility] Provider Note   CSN: [id number] Arrival date & time: 11/07/24  8165     Patient presents with: Medication Refill   Cole Barrett is a 38 y.o. male.   Here for medication stating he is treated with 1 mg Risperdal . Chart reviewed. He is seen here regularly for Risperdal  dosing being unable to afford the medication. No new mental health issues. He is requesting bacitracin  for a sore on his elbow. No fever. No vomiting.  The history is provided by the patient. No language interpreter was used.  Medication Refill      Prior to Admission medications  Medication Sig Start Date End Date Taking? Authorizing Provider  bacitracin  ointment Apply 1 Application topically 2 (two) times daily. 11/02/24   Ula Prentice SAUNDERS, MD  risperiDONE  (RISPERDAL ) 1 MG tablet Take 1 tablet (1 mg total) by mouth 2 (two) times daily. 09/17/24 10/17/24  Jerrol Agent, MD  risperiDONE  (RISPERDAL ) 1 MG tablet Take 1 tablet (1 mg total) by mouth at bedtime. 11/02/24   Ula Prentice SAUNDERS, MD  amantadine  (SYMMETREL ) 100 MG capsule Take 1 capsule (100 mg total) by mouth 2 (two) times daily. Patient not taking: Reported on 11/04/2019 09/01/19 11/17/19  [name], NP    Allergies: Patient has no known allergies.    Review of Systems  Updated Vital Signs BP (!) 133/91 (BP Location: Right Arm)   Pulse 93   Resp 16   SpO2 94%   Physical Exam Constitutional:      Appearance: He is well-developed.  Pulmonary:     Effort: Pulmonary effort is normal.  Musculoskeletal:        General: Normal range of motion.     Cervical back: Normal range of motion.     Comments: No deep ulcerations or skin breakdown.  Skin:    General: Skin is warm and dry.     Findings: No erythema.  Neurological:     Mental Status: He is alert and oriented to person, place, and time.     (all labs ordered are listed, but only abnormal results are displayed) Labs Reviewed -  No data to display  EKG: None  Radiology: No results found.   Procedures   Medications Ordered in the ED  risperiDONE  (RISPERDAL ) tablet 1 mg (has no administration in time range)  bacitracin  ointment (has no administration in time range)    Clinical Course as of 11/07/24 2022  Thu Nov 07, 2024  2018 Patient to ED for regular medication dosing. He reports no other needs. VSS. Stable for discharge. Medication provided. [SU]    Clinical Course User Index [SU] Odell Balls, PA-C                                 Medical Decision Making Risk OTC drugs. Prescription drug management.        Final diagnoses:  Medication refill    ED Discharge Orders     None          Odell Balls RIGGERS 11/07/24 2022    Ula Prentice SAUNDERS, MD 11/08/24 [phone]

## 2024-11-08 ENCOUNTER — Encounter (HOSPITAL_COMMUNITY): Payer: Self-pay

## 2024-11-08 ENCOUNTER — Emergency Department (HOSPITAL_COMMUNITY)
Admission: EM | Admit: 2024-11-08 | Discharge: 2024-11-08 | Disposition: A | Discharge status: Home / Self Care | Payer: Self-pay | Attending: Emergency Medicine | Admitting: Emergency Medicine

## 2024-11-08 DIAGNOSIS — L2082 Flexural eczema: Secondary | ICD-10-CM | POA: Insufficient documentation

## 2024-11-08 DIAGNOSIS — Z5900 Homelessness unspecified: Secondary | ICD-10-CM | POA: Insufficient documentation

## 2024-11-08 DIAGNOSIS — Z760 Encounter for issue of repeat prescription: Secondary | ICD-10-CM | POA: Insufficient documentation

## 2024-11-08 MED ORDER — RISPERIDONE 1 MG PO TABS
1.0000 mg | ORAL_TABLET | Freq: Once | ORAL | Status: AC
  Administered 2024-11-08: 1 mg via ORAL
  Filled 2024-11-08: qty 1

## 2024-11-08 MED ORDER — BACITRACIN ZINC 500 UNIT/GM EX OINT
TOPICAL_OINTMENT | Freq: Once | CUTANEOUS | Status: AC
  Filled 2024-11-08: qty 5.4

## 2024-11-08 NOTE — Discharge Instructions (Signed)
 Please take your medicines as prescribed  Use bacitracin  twice daily  Return to ER if you have thoughts of harming yourself or others

## 2024-11-08 NOTE — ED Triage Notes (Signed)
 Pt here requesting medication, asking for dose of risperdal 

## 2024-11-08 NOTE — ED Provider Notes (Signed)
 [place] EMERGENCY DEPARTMENT AT [facility] Provider Note   CSN: [id number] Arrival date & time: 11/08/24  1734     Patient presents with: No chief complaint on file.   Cole Barrett is a 38 y.o. male history of schizophrenia here presenting with medication refill.  Patient cannot afford his risperdal  and requests risperdal  for anxiety.  Patient also requests bacitracin  for his rash.  Patient was seen here several days ago for the same symptoms.  Patient denies any thoughts of harming himself or others or hallucinations.  Patient states that he is homeless   HPI     Prior to Admission medications  Medication Sig Start Date End Date Taking? Authorizing Provider  bacitracin  ointment Apply 1 Application topically 2 (two) times daily. 11/02/24   Ula Prentice SAUNDERS, MD  risperiDONE  (RISPERDAL ) 1 MG tablet Take 1 tablet (1 mg total) by mouth 2 (two) times daily. 09/17/24 10/17/24  Jerrol Agent, MD  risperiDONE  (RISPERDAL ) 1 MG tablet Take 1 tablet (1 mg total) by mouth at bedtime. 11/02/24   Ula Prentice SAUNDERS, MD  amantadine  (SYMMETREL ) 100 MG capsule Take 1 capsule (100 mg total) by mouth 2 (two) times daily. Patient not taking: Reported on 11/04/2019 09/01/19 11/17/19  [name], NP    Allergies: Patient has no known allergies.    Review of Systems  Skin:  Positive for rash.  All other systems reviewed and are negative.   Updated Vital Signs BP 114/78 (BP Location: Right Arm)   Pulse (!) 102   Temp 98.4 F (36.9 C)   Resp (!) 21   SpO2 98%   Physical Exam Vitals and nursing note reviewed.  Constitutional:      Comments: Disheveled  HENT:     Head: Normocephalic.     Nose: Nose normal.     Mouth/Throat:     Mouth: Mucous membranes are dry.  Eyes:     Extraocular Movements: Extraocular movements intact.     Pupils: Pupils are equal, round, and reactive to light.  Cardiovascular:     Rate and Rhythm: Normal rate.     Pulses: Normal pulses.   Pulmonary:     Effort: Pulmonary effort is normal.  Abdominal:     General: Abdomen is flat.     Palpations: Abdomen is soft.  Musculoskeletal:        General: Normal range of motion.     Cervical back: Normal range of motion.  Skin:    Comments: Eczema on bilateral elbows.  No signs of cellulitis  Neurological:     General: No focal deficit present.     Mental Status: He is alert.  Psychiatric:        Mood and Affect: Mood normal.        Behavior: Behavior normal.     (all labs ordered are listed, but only abnormal results are displayed) Labs Reviewed - No data to display  EKG: None  Radiology: No results found.   Procedures   Medications Ordered in the ED  risperiDONE  (RISPERDAL ) tablet 1 mg (has no administration in time range)  bacitracin  ointment (has no administration in time range)                                    Medical Decision Making Square Jowett is a 38 y.o. male who presenting with medication refill and eczema.  Patient was given risperdal   and bacitracin .  Stable for discharge and gave strict return precaution   Risk OTC drugs. Prescription drug management.    Final diagnoses:  Medication refill  Flexural eczema    ED Discharge Orders     None          Patt Alm Macho, MD 11/08/24 2253

## 2024-11-09 ENCOUNTER — Emergency Department (HOSPITAL_COMMUNITY)
Admission: EM | Admit: 2024-11-09 | Discharge: 2024-11-10 | Discharge status: Against Medical Advice | Payer: Self-pay | Source: Home / Self Care

## 2024-11-09 NOTE — ED Notes (Signed)
 Pt called for triage, no answer, pt not in the lobby

## 2024-11-11 ENCOUNTER — Emergency Department (HOSPITAL_COMMUNITY)
Admission: EM | Admit: 2024-11-11 | Discharge: 2024-11-11 | Disposition: A | Discharge status: Home / Self Care | Payer: Self-pay | Attending: Emergency Medicine | Admitting: Emergency Medicine

## 2024-11-11 ENCOUNTER — Encounter (HOSPITAL_COMMUNITY): Payer: Self-pay | Admitting: Emergency Medicine

## 2024-11-11 ENCOUNTER — Other Ambulatory Visit: Payer: Self-pay

## 2024-11-11 DIAGNOSIS — Z760 Encounter for issue of repeat prescription: Secondary | ICD-10-CM

## 2024-11-11 MED ORDER — RISPERIDONE 1 MG PO TABS
1.0000 mg | ORAL_TABLET | ORAL | Status: AC
  Administered 2024-11-11: 20:00:00 1 mg via ORAL
  Filled 2024-11-11: qty 1

## 2024-11-11 MED ORDER — BACITRACIN ZINC 500 UNIT/GM EX OINT
1.0000 | TOPICAL_OINTMENT | Freq: Two times a day (BID) | CUTANEOUS | Status: DC
  Administered 2024-11-11: 20:00:00 1 via TOPICAL
  Filled 2024-11-11: qty 1.8

## 2024-11-11 MED ORDER — RISPERIDONE 1 MG PO TABS: 1.0000 mg | ORAL_TABLET | Freq: Two times a day (BID) | ORAL | Status: DC

## 2024-11-11 NOTE — ED Triage Notes (Signed)
 Pt requesting Risperdal  and bacitracin  prescription

## 2024-11-11 NOTE — ED Provider Notes (Signed)
 [place] EMERGENCY DEPARTMENT AT [facility] Provider Note   CSN: [id number] Arrival date & time: 11/11/24  1755     Patient presents with: Medication Refill   Cole Barrett is a 38 y.o. male past medical history of schizophrenia is presenting with complaint of needing his medication.  He is requesting his dose of Risperdal  for the day and he is requesting bacitracin  for a rash.  Patient is seen on this daily for similar complaint.  He denies any other complaint.    Medication Refill      Prior to Admission medications  Medication Sig Start Date End Date Taking? Authorizing Provider  bacitracin  ointment Apply 1 Application topically 2 (two) times daily. 11/02/24   Ula Prentice SAUNDERS, MD  risperiDONE  (RISPERDAL ) 1 MG tablet Take 1 tablet (1 mg total) by mouth 2 (two) times daily. 09/17/24 10/17/24  Jerrol Agent, MD  risperiDONE  (RISPERDAL ) 1 MG tablet Take 1 tablet (1 mg total) by mouth at bedtime. 11/02/24   Ula Prentice SAUNDERS, MD  amantadine  (SYMMETREL ) 100 MG capsule Take 1 capsule (100 mg total) by mouth 2 (two) times daily. Patient not taking: Reported on 11/04/2019 09/01/19 11/17/19  [name], NP    Allergies: Patient has no known allergies.    Review of Systems  Constitutional:  Positive for activity change.    Updated Vital Signs BP 134/84 (BP Location: Right Arm)   Pulse 91   Temp 98.1 F (36.7 C)   Resp 19   SpO2 97%   Physical Exam Vitals and nursing note reviewed.  Constitutional:      General: He is not in acute distress.    Appearance: He is not toxic-appearing.  HENT:     Head: Normocephalic and atraumatic.  Eyes:     General: No scleral icterus.    Conjunctiva/sclera: Conjunctivae normal.  Cardiovascular:     Rate and Rhythm: Normal rate and regular rhythm.     Pulses: Normal pulses.     Heart sounds: Normal heart sounds.  Pulmonary:     Effort: Pulmonary effort is normal. No respiratory distress.     Breath sounds: Normal  breath sounds.  Abdominal:     General: Abdomen is flat. Bowel sounds are normal.     Palpations: Abdomen is soft.     Tenderness: There is no abdominal tenderness.  Skin:    General: Skin is warm and dry.     Findings: No lesion.  Neurological:     General: No focal deficit present.     Mental Status: He is alert and oriented to person, place, and time. Mental status is at baseline.     (all labs ordered are listed, but only abnormal results are displayed) Labs Reviewed - No data to display  EKG: None  Radiology: No results found.   Procedures   Medications Ordered in the ED - No data to display                                  Medical Decision Making Risk OTC drugs. Prescription drug management.   This patient presents to the ED for concern of medication refill, this involves an extensive number of treatment options, and is a complaint that carries with it a high risk of complications and morbidity.  The differential diagnosis includes medication refill, malingering, SI    Problem List / ED Course / Critical interventions / Medication management  Patient is presenting to emergency room with complaint of medication refill.  He is requesting bacitracin  and his dose of risperidone .  He reports he has not taken it today and he is not actually needing a refill...  He is requesting to have his dose here and then discharge.  He was provided with medication.  Vital signs are stable.  He denies any other additional complaints. Given does of medication here. Stable, well appearing. Well known to ED. appropriate for discharge with outpatient management at this time.        Final diagnoses:  Medication refill    ED Discharge Orders     None          Shermon Warren SAILOR, PA-C 11/11/24 1959    Freddi Hamilton, MD 11/11/24 2306

## 2024-11-12 ENCOUNTER — Emergency Department (HOSPITAL_COMMUNITY)
Admission: EM | Admit: 2024-11-12 | Discharge: 2024-11-12 | Discharge status: Against Medical Advice | Payer: Self-pay | Attending: Emergency Medicine | Admitting: Emergency Medicine

## 2024-11-13 ENCOUNTER — Encounter (HOSPITAL_COMMUNITY): Payer: Self-pay

## 2024-11-13 ENCOUNTER — Emergency Department (HOSPITAL_COMMUNITY)
Admission: EM | Admit: 2024-11-13 | Discharge: 2024-11-14 | Disposition: A | Discharge status: Home / Self Care | Payer: Self-pay | Source: Home / Self Care | Attending: Emergency Medicine | Admitting: Emergency Medicine

## 2024-11-13 ENCOUNTER — Other Ambulatory Visit: Payer: Self-pay

## 2024-11-13 DIAGNOSIS — Z91138 Patient's unintentional underdosing of medication regimen for other reason: Secondary | ICD-10-CM

## 2024-11-13 DIAGNOSIS — Z8616 Personal history of COVID-19: Secondary | ICD-10-CM | POA: Insufficient documentation

## 2024-11-13 DIAGNOSIS — Z5900 Homelessness unspecified: Secondary | ICD-10-CM | POA: Insufficient documentation

## 2024-11-13 DIAGNOSIS — Z91148 Patient's other noncompliance with medication regimen for other reason: Secondary | ICD-10-CM | POA: Insufficient documentation

## 2024-11-13 NOTE — ED Triage Notes (Signed)
 Pt here for his daily risperidal

## 2024-11-14 MED ORDER — BACITRACIN ZINC 500 UNIT/GM EX OINT
TOPICAL_OINTMENT | Freq: Once | CUTANEOUS | Status: AC
  Administered 2024-11-14: 02:00:00 2 via TOPICAL
  Filled 2024-11-14: qty 0.9

## 2024-11-14 MED ADMIN — Risperidone Tab 1 MG: 1 mg | ORAL | @ 02:00:00 | NDC 68382011414

## 2024-11-14 MED FILL — Risperidone Tab 1 MG: 1.0000 mg | ORAL | Qty: 1 | Status: AC

## 2024-11-14 NOTE — ED Provider Notes (Addendum)
 [place] EMERGENCY DEPARTMENT AT [facility] Provider Note  CSN: [id number] Arrival date & time: 11/13/24 2045  Chief Complaint(s) Medication Refill  HPI & MDM Cole Barrett is a 38 y.o. male here for for dosing of his Risperdal .  Patient has a history of homelessness and schizophrenia.  Frequently presents for similar presentations.  Denies any other physical complaints other than requesting bacitracin  for healed right upper extremity excoriations.. History provided by patient. HPI  Risperdal  dose given.  Bacitracin  packet given.  No need for labs or workup at this time.    Medical Decision Making Risk OTC drugs. Prescription drug management.    Final Clinical Impression(s) / ED Diagnoses Final diagnoses:  Medication dose missed   The patient appears reasonably screened and/or stabilized for discharge and I doubt any other medical condition or other [facility] requiring further screening, evaluation, or treatment in the ED at this time. I have discussed the findings, Dx and Tx plan with the patient/family who expressed understanding and agree(s) with the plan. Discharge instructions discussed at length. The patient/family was given strict return precautions who verbalized understanding of the instructions. No further questions at time of discharge.  Disposition: Discharge  Condition: Good  ED Discharge Orders     None         Follow Up: [facility] Address: [address] [place], [place] [address] Hours: Open 24 hours Monday through Sunday Phone: [phone] Go to  as needed     Past Medical History Past Medical History:  Diagnosis Date   COVID-19    Hallucination    Homelessness    Hypercholesterolemia    Malingering    Mental disorder    Paranoid behavior (HCC)    PE (pulmonary embolism)    Schizophrenia (HCC)    Patient Active Problem List   Diagnosis Date Noted   Acute psychosis (HCC) 08/31/2019    Schizophrenia (HCC) 08/31/2019   Schizoaffective disorder, bipolar type (HCC) 01/19/2019   Alcohol abuse    Auditory hallucinations    Alcohol abuse with alcohol-induced mood disorder (HCC) 09/08/2016   Homelessness 09/03/2016   Person feigning illness 09/03/2016   Brief psychotic disorder (HCC) 08/29/2016   Cannabis use disorder, mild, abuse 08/29/2016   Pulmonary emboli (HCC) 07/13/2016   Adjustment disorder with emotional disturbance 03/12/2014   Pulmonary embolism and infarction (HCC) 01/02/2014   Acute left flank pain 01/02/2014   Pulmonary embolism, bilateral (HCC) 01/02/2014   Home Medication(s) Prior to Admission medications  Medication Sig Start Date End Date Taking? Authorizing Provider  bacitracin  ointment Apply 1 Application topically 2 (two) times daily. 11/02/24   Ula Prentice SAUNDERS, MD  risperiDONE  (RISPERDAL ) 1 MG tablet Take 1 tablet (1 mg total) by mouth 2 (two) times daily. 09/17/24 10/17/24  Jerrol Agent, MD  risperiDONE  (RISPERDAL ) 1 MG tablet Take 1 tablet (1 mg total) by mouth at bedtime. 11/02/24   Ula Prentice SAUNDERS, MD  amantadine  (SYMMETREL ) 100 MG capsule Take 1 capsule (100 mg total) by mouth 2 (two) times daily. Patient not taking: Reported on 11/04/2019 09/01/19 11/17/19  [name], NP  Allergies Patient has no known allergies.  Review of Systems Review of Systems As noted in HPI  Physical Exam Vital Signs  I have reviewed the triage vital signs BP 127/68 (BP Location: Left Arm)   Pulse 66   Resp 18   SpO2 98%   Physical Exam Vitals reviewed.  Constitutional:      General: He is not in acute distress.    Appearance: He is well-developed. He is not diaphoretic.  HENT:     Head: Normocephalic and atraumatic.     Right Ear: External ear normal.     Left Ear: External ear normal.     Nose: Nose normal.     Mouth/Throat:      Mouth: Mucous membranes are moist.  Eyes:     General: No scleral icterus.    Conjunctiva/sclera: Conjunctivae normal.  Neck:     Trachea: Phonation normal.  Cardiovascular:     Rate and Rhythm: Normal rate and regular rhythm.  Pulmonary:     Effort: Pulmonary effort is normal. No respiratory distress.     Breath sounds: No stridor.  Abdominal:     General: There is no distension.  Musculoskeletal:        General: Normal range of motion.     Cervical back: Normal range of motion.  Skin:    Findings: No abrasion, laceration, lesion, rash or wound.  Neurological:     Mental Status: He is alert and oriented to person, place, and time.  Psychiatric:        Behavior: Behavior normal.     ED Results and Treatments Labs (all labs ordered are listed, but only abnormal results are displayed) Labs Reviewed - No data to display                                                                                                                       EKG  EKG Interpretation Date/Time:    Ventricular Rate:    PR Interval:    QRS Duration:    QT Interval:    QTC Calculation:   R Axis:      Text Interpretation:         Radiology No results found.  Medications Ordered in ED Medications  risperiDONE  (RISPERDAL ) tablet 1 mg (has no administration in time range)  bacitracin  ointment (has no administration in time range)   Procedures Procedures  (including critical care time)   This chart was dictated using voice recognition software.  Despite best efforts to proofread,  errors can occur which can change the documentation meaning.     Trine Raynell Moder, MD 11/14/24 [phone]

## 2024-11-15 ENCOUNTER — Encounter (HOSPITAL_COMMUNITY): Payer: Self-pay | Admitting: *Deleted

## 2024-11-15 ENCOUNTER — Other Ambulatory Visit: Payer: Self-pay

## 2024-11-15 ENCOUNTER — Emergency Department (HOSPITAL_COMMUNITY)
Admission: EM | Admit: 2024-11-15 | Discharge: 2024-11-16 | Disposition: A | Payer: Self-pay | Attending: Emergency Medicine | Admitting: Emergency Medicine

## 2024-11-15 DIAGNOSIS — Z760 Encounter for issue of repeat prescription: Secondary | ICD-10-CM | POA: Insufficient documentation

## 2024-11-15 DIAGNOSIS — Z5900 Homelessness unspecified: Secondary | ICD-10-CM | POA: Insufficient documentation

## 2024-11-15 NOTE — ED Triage Notes (Signed)
 Patient requesting his medication for today .

## 2024-11-16 MED ORDER — RISPERIDONE 1 MG PO TABS
1.0000 mg | ORAL_TABLET | Freq: Once | ORAL | Status: AC
  Administered 2024-11-16: 1 mg via ORAL
  Filled 2024-11-16: qty 1

## 2024-11-16 NOTE — Discharge Instructions (Signed)

## 2024-11-16 NOTE — ED Provider Notes (Signed)
 "  Emergency Department Provider Note   I have reviewed the triage vital signs and the nursing notes.   HISTORY  Chief Complaint Medication Refill   HPI Cole Barrett is a 38 y.o. male with past history of schizophrenia and homelessness presents to the emergency department with request to be given his Risperdal .  It was sent to the pharmacy on 12/6 but states he has not been able to get over and pick that up.  He plans to do that in a couple of days.  He is also requesting multiple packs of bacitracin .  No other complaints.  Denies hallucinations, SI/HI.   Past Medical History:  Diagnosis Date   COVID-19    Hallucination    Homelessness    Hypercholesterolemia    Malingering    Mental disorder    Paranoid behavior (HCC)    PE (pulmonary embolism)    Schizophrenia (HCC)     Review of Systems  Constitutional: No fever/chills  Cardiovascular: Denies chest pain. Respiratory: Denies shortness of breath. Gastrointestinal: No abdominal pain.  No nausea, no vomiting.  No diarrhea.  Neurological: Negative for headaches.  ____________________________________________   PHYSICAL EXAM:  VITAL SIGNS: ED Triage Vitals  Encounter Vitals Group     BP 11/15/24 2040 (!) 128/91     Pulse Rate 11/15/24 2040 80     Resp 11/15/24 2040 (!) 23     Temp 11/15/24 2040 97.9 F (36.6 C)     Temp src --      SpO2 11/15/24 2040 100 %     Weight 11/15/24 2044 220 lb 7.4 oz (100 kg)     Height 11/15/24 2044 5' 10 (1.778 m)   Constitutional: Alert and oriented. Well appearing and in no acute distress. Eyes: Conjunctivae are normal.  Head: Atraumatic. Nose: No congestion/rhinnorhea. Mouth/Throat: Mucous membranes are moist.  Neck: No stridor.   Cardiovascular: Good peripheral circulation.  Respiratory: Normal respiratory effort.   Gastrointestinal: No distention.  Musculoskeletal: No gross deformities of extremities. Neurologic:  Normal speech and language.  Skin:  Skin  is warm, dry and intact. No rash noted.  ____________________________________________   PROCEDURES  Procedure(s) performed:   Procedures  None ____________________________________________   INITIAL IMPRESSION / ASSESSMENT AND PLAN / ED COURSE  Pertinent labs & imaging results that were available during my care of the patient were reviewed by me and considered in my medical decision making (see chart for details).   This patient is Presenting for Evaluation of med encounter, which does require a range of treatment options, and is a complaint that involves a low risk of morbidity and mortality.  The Differential Diagnoses include encounter for medication administration, malingering, acute psychosis, etc.  Critical Interventions-    Medications  risperiDONE  (RISPERDAL ) tablet 1 mg (has no administration in time range)   Medical Decision Making: Summary:  Patient presents emergency department with request for his Risperdal  tablet.  I will administer this as has been done in the past.  He does have a prescription at the pharmacy sent over earlier this month and I encouraged him to pick that up.  Also provided some bacitracin  at request.  He does not have a specific wound but states he uses this to keep the razor bumps from popping up.  Stable for discharge.   Patient's presentation is most consistent with exacerbation of chronic illness.   Disposition: discharge  ____________________________________________  FINAL CLINICAL IMPRESSION(S) / ED DIAGNOSES  Final diagnoses:  Encounter for medication refill  Note:  This document was prepared using Dragon voice recognition software and may include unintentional dictation errors.  Fonda Law, MD, [facility] Emergency Medicine    Jaedynn Bohlken, Fonda MATSU, MD 11/16/24 [phone]  "

## 2024-11-18 ENCOUNTER — Other Ambulatory Visit: Payer: Self-pay

## 2024-11-18 ENCOUNTER — Encounter (HOSPITAL_COMMUNITY): Payer: Self-pay | Admitting: Emergency Medicine

## 2024-11-18 ENCOUNTER — Emergency Department (HOSPITAL_COMMUNITY)
Admission: EM | Admit: 2024-11-18 | Discharge: 2024-11-19 | Discharge status: Against Medical Advice | Payer: Self-pay | Attending: Emergency Medicine | Admitting: Emergency Medicine

## 2024-11-18 DIAGNOSIS — Z5321 Procedure and treatment not carried out due to patient leaving prior to being seen by health care provider: Secondary | ICD-10-CM | POA: Insufficient documentation

## 2024-11-18 NOTE — ED Triage Notes (Signed)
Pt states that he needs his medications.

## 2024-11-19 NOTE — ED Notes (Signed)
Pt stated he had to leave. 

## 2024-11-23 ENCOUNTER — Other Ambulatory Visit: Payer: Self-pay

## 2024-11-23 ENCOUNTER — Emergency Department (HOSPITAL_COMMUNITY)
Admission: EM | Admit: 2024-11-23 | Discharge: 2024-11-23 | Discharge status: Against Medical Advice | Payer: Self-pay | Attending: Emergency Medicine | Admitting: Emergency Medicine

## 2024-11-23 ENCOUNTER — Encounter (HOSPITAL_COMMUNITY): Payer: Self-pay | Admitting: Emergency Medicine

## 2024-11-23 DIAGNOSIS — Z5900 Homelessness unspecified: Secondary | ICD-10-CM | POA: Insufficient documentation

## 2024-11-23 DIAGNOSIS — Z760 Encounter for issue of repeat prescription: Secondary | ICD-10-CM | POA: Insufficient documentation

## 2024-11-23 DIAGNOSIS — Z5321 Procedure and treatment not carried out due to patient leaving prior to being seen by health care provider: Secondary | ICD-10-CM | POA: Insufficient documentation

## 2024-11-23 NOTE — ED Notes (Signed)
 Pt stated he had to leave for work

## 2024-11-23 NOTE — ED Triage Notes (Signed)
 Homeless pt arrive requesting his medication.

## 2024-11-26 ENCOUNTER — Other Ambulatory Visit: Payer: Self-pay

## 2024-11-26 ENCOUNTER — Emergency Department (HOSPITAL_COMMUNITY)
Admission: EM | Admit: 2024-11-26 | Discharge: 2024-11-26 | Discharge status: Against Medical Advice | Payer: Self-pay | Attending: Emergency Medicine | Admitting: Emergency Medicine

## 2024-11-26 DIAGNOSIS — Z760 Encounter for issue of repeat prescription: Secondary | ICD-10-CM | POA: Insufficient documentation

## 2024-11-26 DIAGNOSIS — Z5321 Procedure and treatment not carried out due to patient leaving prior to being seen by health care provider: Secondary | ICD-10-CM | POA: Insufficient documentation

## 2024-11-26 NOTE — ED Triage Notes (Signed)
 Patient requesting Risperidal medication . No distress .

## 2024-11-28 ENCOUNTER — Other Ambulatory Visit: Payer: Self-pay

## 2024-11-28 ENCOUNTER — Emergency Department (HOSPITAL_COMMUNITY)
Admission: EM | Admit: 2024-11-28 | Discharge: 2024-11-28 | Disposition: A | Discharge status: Home / Self Care | Payer: Self-pay | Source: Home / Self Care | Attending: Emergency Medicine | Admitting: Emergency Medicine

## 2024-11-28 DIAGNOSIS — Z5900 Homelessness unspecified: Secondary | ICD-10-CM | POA: Insufficient documentation

## 2024-11-28 DIAGNOSIS — J45909 Unspecified asthma, uncomplicated: Secondary | ICD-10-CM | POA: Insufficient documentation

## 2024-11-28 DIAGNOSIS — Z760 Encounter for issue of repeat prescription: Secondary | ICD-10-CM | POA: Insufficient documentation

## 2024-11-28 MED ORDER — RISPERIDONE 1 MG PO TABS
1.0000 mg | ORAL_TABLET | Freq: Once | ORAL | Status: AC
  Administered 2024-11-28: 1 mg via ORAL
  Filled 2024-11-28: qty 1

## 2024-11-28 NOTE — Discharge Instructions (Signed)
"  Follow up with PCP.   "

## 2024-11-28 NOTE — ED Triage Notes (Signed)
 Patient requesting Risperidal dose and Bacitracin  .

## 2024-11-28 NOTE — ED Provider Notes (Signed)
 " [place] EMERGENCY DEPARTMENT AT [facility] Provider Note   CSN: [id number] Arrival date & time: 11/28/24  9680     Patient presents with: No chief complaint on file.   Cole Barrett is a 39 y.o. male extensive history of loose nations, homelessness, malingering, mental disorder, paranoid behavior, schizophrenia.  Presents to ED requesting Risperdal .  Patient often comes to ED requesting this.  He is also requesting hygienic product such as deodorant, toothpaste, lotion and soap, food.  Patient provided with the above items.  Denies any other concerns.  Alert and oriented x 4.  Not responding to internal stimuli.  HPI     Prior to Admission medications  Medication Sig Start Date End Date Taking? Authorizing Provider  bacitracin  ointment Apply 1 Application topically 2 (two) times daily. 11/02/24   Ula Prentice SAUNDERS, MD  risperiDONE  (RISPERDAL ) 1 MG tablet Take 1 tablet (1 mg total) by mouth 2 (two) times daily. 09/17/24 10/17/24  Jerrol Agent, MD  risperiDONE  (RISPERDAL ) 1 MG tablet Take 1 tablet (1 mg total) by mouth at bedtime. 11/02/24   Ula Prentice SAUNDERS, MD  amantadine  (SYMMETREL ) 100 MG capsule Take 1 capsule (100 mg total) by mouth 2 (two) times daily. Patient not taking: Reported on 11/04/2019 09/01/19 11/17/19  [name], NP    Allergies: Patient has no known allergies.    Review of Systems  All other systems reviewed and are negative.   Updated Vital Signs BP (!) 142/97 (BP Location: Right Arm)   Pulse 64   Resp 16   SpO2 100%   Physical Exam Vitals and nursing note reviewed.  Constitutional:      General: He is not in acute distress.    Appearance: He is well-developed.  HENT:     Head: Normocephalic and atraumatic.  Eyes:     Conjunctiva/sclera: Conjunctivae normal.  Cardiovascular:     Rate and Rhythm: Normal rate and regular rhythm.     Heart sounds: No murmur heard. Pulmonary:     Effort: Pulmonary effort is normal. No respiratory  distress.     Breath sounds: Normal breath sounds.  Abdominal:     Palpations: Abdomen is soft.     Tenderness: There is no abdominal tenderness.  Musculoskeletal:        General: No swelling.     Cervical back: Neck supple.  Skin:    General: Skin is warm and dry.     Capillary Refill: Capillary refill takes less than 2 seconds.  Neurological:     Mental Status: He is alert.  Psychiatric:        Mood and Affect: Mood normal.     (all labs ordered are listed, but only abnormal results are displayed) Labs Reviewed - No data to display  EKG: None  Radiology: No results found.  Procedures   Medications Ordered in the ED  risperiDONE  (RISPERDAL ) tablet 1 mg (1 mg Oral Given 11/28/24 0331)     Medical Decision Making Risk Prescription drug management.   39 year old male presents requesting medication refill.  HD stable.  Not responding to internal stimuli.  Alert and oriented.  No signs of acute psychosis.  Patient requesting hygienic products, Risperdal .  Patient given hydronephrotic, food, Risperdal .  Patient requesting we allow him to shower, advised patient that we are unable to do this.  Patient also given bacitracin  packets.  Patient discharged at this time in stable condition.   Final diagnoses:  Medication refill    ED  Discharge Orders     None          Salley Boxley F, PA-C 11/28/24 9663    Lorette Mayo, MD 11/28/24 [phone]  "

## 2024-12-01 ENCOUNTER — Other Ambulatory Visit: Payer: Self-pay

## 2024-12-01 ENCOUNTER — Encounter (HOSPITAL_COMMUNITY): Payer: Self-pay | Admitting: *Deleted

## 2024-12-01 ENCOUNTER — Emergency Department (HOSPITAL_COMMUNITY)
Admission: EM | Admit: 2024-12-01 | Discharge: 2024-12-02 | Disposition: A | Discharge status: Home / Self Care | Payer: Self-pay | Attending: Emergency Medicine | Admitting: Emergency Medicine

## 2024-12-01 DIAGNOSIS — Z760 Encounter for issue of repeat prescription: Secondary | ICD-10-CM | POA: Insufficient documentation

## 2024-12-01 DIAGNOSIS — Z79899 Other long term (current) drug therapy: Secondary | ICD-10-CM

## 2024-12-01 DIAGNOSIS — R21 Rash and other nonspecific skin eruption: Secondary | ICD-10-CM | POA: Insufficient documentation

## 2024-12-01 NOTE — ED Triage Notes (Signed)
 The pt wants medication

## 2024-12-02 MED ORDER — RISPERIDONE 1 MG PO TABS
1.0000 mg | ORAL_TABLET | Freq: Once | ORAL | Status: AC
  Administered 2024-12-02: 1 mg via ORAL
  Filled 2024-12-02: qty 1

## 2024-12-02 MED ORDER — BACITRACIN ZINC 500 UNIT/GM EX OINT
TOPICAL_OINTMENT | Freq: Once | CUTANEOUS | Status: AC
  Administered 2024-12-02: 1 via TOPICAL
  Filled 2024-12-02: qty 2.7

## 2024-12-02 NOTE — ED Notes (Signed)
 Pt given bus pass ?

## 2024-12-02 NOTE — ED Provider Notes (Signed)
 "  EMERGENCY DEPARTMENT AT [facility] Provider Note   CSN: [id number] Arrival date & time: 12/01/24  2123     Patient presents with: medication   Kevork Joyce is a 39 y.o. male.   The history is provided by the patient and medical records.   39 y.o. M here requesting his Risperdal .  Well known to the ED for same.  Also requesting bacitracin  for chronic rash on his neck.  Prior to Admission medications  Medication Sig Start Date End Date Taking? Authorizing Provider  bacitracin  ointment Apply 1 Application topically 2 (two) times daily. 11/02/24   Ula Prentice SAUNDERS, MD  risperiDONE  (RISPERDAL ) 1 MG tablet Take 1 tablet (1 mg total) by mouth 2 (two) times daily. 09/17/24 10/17/24  Jerrol Agent, MD  risperiDONE  (RISPERDAL ) 1 MG tablet Take 1 tablet (1 mg total) by mouth at bedtime. 11/02/24   Ula Prentice SAUNDERS, MD  amantadine  (SYMMETREL ) 100 MG capsule Take 1 capsule (100 mg total) by mouth 2 (two) times daily. Patient not taking: Reported on 11/04/2019 09/01/19 11/17/19  [name], NP    Allergies: Patient has no known allergies.    Review of Systems  Constitutional:        Medications  All other systems reviewed and are negative.   Updated Vital Signs BP 122/87 (BP Location: Right Arm)   Pulse 77   Temp 98.7 F (37.1 C)   Resp 18   Ht 5' 10 (1.778 m)   Wt 100 kg   SpO2 99%   BMI 31.63 kg/m   Physical Exam Vitals and nursing note reviewed.  Constitutional:      Appearance: He is well-developed.  HENT:     Head: Normocephalic and atraumatic.  Eyes:     Conjunctiva/sclera: Conjunctivae normal.     Pupils: Pupils are equal, round, and reactive to light.  Neck:     Comments: Chronic appearing rash and skin dryness to left side of neck, no open wounds or drainage, no signs of superimposed infection Cardiovascular:     Rate and Rhythm: Normal rate and regular rhythm.     Heart sounds: Normal heart sounds.  Pulmonary:     Effort:  Pulmonary effort is normal.     Breath sounds: Normal breath sounds.  Abdominal:     General: Bowel sounds are normal.     Palpations: Abdomen is soft.  Musculoskeletal:        General: Normal range of motion.     Cervical back: Normal range of motion.  Skin:    General: Skin is warm and dry.  Neurological:     Mental Status: He is alert and oriented to person, place, and time.     (all labs ordered are listed, but only abnormal results are displayed) Labs Reviewed - No data to display  EKG: None  Radiology: No results found.   Procedures   Medications Ordered in the ED  risperiDONE  (RISPERDAL ) tablet 1 mg (1 mg Oral Given 12/02/24 0430)  bacitracin  ointment (1 Application Topical Given 12/02/24 0430)                                    Medical Decision Making Risk OTC drugs. Prescription drug management.   39 year old male here requesting his Risperdal .  He is well-known to the ED for same.  He appears at his baseline.  Vitals are stable.  Also requested  some bacitracin  for chronic neck rash which was given.  This is without any signs of superimposed infection on my exam.  Feel he is stable for discharge.  Final diagnoses:  Medication management    ED Discharge Orders     None          Jarold Olam HERO, PA-C 12/02/24 0435    Theadore Ozell HERO, MD 12/02/24 [phone]  "

## 2024-12-03 ENCOUNTER — Encounter (HOSPITAL_COMMUNITY): Payer: Self-pay

## 2024-12-03 ENCOUNTER — Emergency Department (HOSPITAL_COMMUNITY)
Admission: EM | Admit: 2024-12-03 | Discharge: 2024-12-04 | Payer: Self-pay | Attending: Emergency Medicine | Admitting: Emergency Medicine

## 2024-12-03 ENCOUNTER — Emergency Department (HOSPITAL_COMMUNITY)
Admission: EM | Admit: 2024-12-03 | Discharge: 2024-12-03 | Disposition: A | Discharge status: Home / Self Care | Payer: Self-pay | Attending: Emergency Medicine | Admitting: Emergency Medicine

## 2024-12-03 ENCOUNTER — Other Ambulatory Visit: Payer: Self-pay

## 2024-12-03 DIAGNOSIS — Z5321 Procedure and treatment not carried out due to patient leaving prior to being seen by health care provider: Secondary | ICD-10-CM | POA: Insufficient documentation

## 2024-12-03 DIAGNOSIS — Z760 Encounter for issue of repeat prescription: Secondary | ICD-10-CM | POA: Insufficient documentation

## 2024-12-03 MED ORDER — RISPERIDONE 1 MG PO TABS
1.0000 mg | ORAL_TABLET | Freq: Once | ORAL | Status: AC
  Administered 2024-12-03: 1 mg via ORAL
  Filled 2024-12-03: qty 1

## 2024-12-03 NOTE — ED Triage Notes (Signed)
 Pt here for his daily medication dose

## 2024-12-03 NOTE — ED Provider Notes (Signed)
 "  EMERGENCY DEPARTMENT AT [facility] Provider Note   CSN: [id number] Arrival date & time: 12/03/24  0041     Patient presents with: Medication Refill   Cole Barrett is a 39 y.o. male significant history of schizoaffective disorder, polysubstance use, presenting requesting dose of Risperdal . He normally comes to the ER to request for the same. Last dose was 2 days ago. No complaints of hallucinations, cp, shob, SI/HI  Also requesting a sandwich   HPI     Prior to Admission medications  Medication Sig Start Date End Date Taking? Authorizing Provider  bacitracin  ointment Apply 1 Application topically 2 (two) times daily. 11/02/24   Ula Prentice SAUNDERS, MD  risperiDONE  (RISPERDAL ) 1 MG tablet Take 1 tablet (1 mg total) by mouth 2 (two) times daily. 09/17/24 10/17/24  Jerrol Agent, MD  risperiDONE  (RISPERDAL ) 1 MG tablet Take 1 tablet (1 mg total) by mouth at bedtime. 11/02/24   Ula Prentice SAUNDERS, MD  amantadine  (SYMMETREL ) 100 MG capsule Take 1 capsule (100 mg total) by mouth 2 (two) times daily. Patient not taking: Reported on 11/04/2019 09/01/19 11/17/19  [name], NP    Allergies: Patient has no known allergies.    Review of Systems  Psychiatric/Behavioral:  Negative for suicidal ideas.     Updated Vital Signs BP 109/73 (BP Location: Right Arm)   Pulse 87   Temp 97.7 F (36.5 C)   Resp 18   Ht 5' 10 (1.778 m)   Wt 100 kg   SpO2 100%   BMI 31.63 kg/m   Physical Exam Vitals and nursing note reviewed.  Constitutional:      General: He is not in acute distress.    Appearance: Normal appearance.  HENT:     Head: Normocephalic and atraumatic.  Eyes:     Conjunctiva/sclera: Conjunctivae normal.  Cardiovascular:     Rate and Rhythm: Normal rate.  Pulmonary:     Effort: Pulmonary effort is normal. No respiratory distress.  Skin:    Coloration: Skin is not jaundiced or pale.  Neurological:     Mental Status: He is alert and oriented to  person, place, and time. Mental status is at baseline.  Psychiatric:        Thought Content: Thought content does not include homicidal or suicidal ideation.     Comments: Does not appear to be responding to internal stimuli     (all labs ordered are listed, but only abnormal results are displayed) Labs Reviewed - No data to display  EKG: None  Radiology: No results found.    Medications Ordered in the ED  risperiDONE  (RISPERDAL ) tablet 1 mg (1 mg Oral Given 12/03/24 0151)                                    Medical Decision Making Risk Prescription drug management.   Patient presents to the ED for concern of medication refill.   Co morbidities that complicate the patient evaluation  Frequent ED visits for medication refill   Additional history obtained:  Additional history obtained from Nursing   External records from outside source obtained and reviewed including triage RN note    Medicines ordered and prescription drug management:  I ordered medication including risperdal   Reevaluation of the patient after these medicines showed that the patient stayed the same I have reviewed the patients home medicines and have made adjustments as  needed    Problem List / ED Course:  Medication refill Vital signs medical stable with no fever or tachycardia.  No hypotension. Did provide patient with Risperdal  Also provided patient with a sandwich He denies SI, HI.  Does not appear to be responding to internal stimuli.  Appears at his baseline. Patient has no complaints of pain nor injury.  No further workup is needed.   Reevaluation:  After the interventions noted above, I reevaluated the patient and found that they have :stayed the same    Dispostion:  After consideration of the diagnostic results and the patients response to treatment, I feel that the patent would benefit from outpatient management symptomatic treatment  Discussed ED workup, disposition,  return to ED precautions with patient who expresses understanding agrees with plan.  All questions answered to their satisfaction.  They are agreeable to plan.  Discharge instructions provided on paperwork  Final diagnoses:  Medication refill    ED Discharge Orders     None        Minnie Tinnie BRAVO, PA 12/03/24 RAYMONDE    Griselda Norris, MD 12/03/24 [phone]  "

## 2024-12-03 NOTE — ED Triage Notes (Signed)
 Patient here for mediations. He wants risperidone  and bacitracin  ointment

## 2024-12-03 NOTE — ED Triage Notes (Signed)
 Pt states that he is just here for his dose of medication

## 2024-12-03 NOTE — Discharge Instructions (Signed)
 Return to Emergency Department for experience chest pain, shortness of breath, suicidal ideation, worsening symptoms

## 2024-12-04 NOTE — ED Notes (Signed)
 Pt informed registration he was leaving.

## 2024-12-04 NOTE — ED Notes (Signed)
 Called for patient to be seen in triage, no answer.

## 2024-12-12 ENCOUNTER — Encounter (HOSPITAL_COMMUNITY): Payer: Self-pay

## 2024-12-12 ENCOUNTER — Emergency Department (HOSPITAL_COMMUNITY)
Admission: EM | Admit: 2024-12-12 | Discharge: 2024-12-12 | Disposition: A | Discharge status: Home / Self Care | Payer: Self-pay | Attending: Emergency Medicine | Admitting: Emergency Medicine

## 2024-12-12 ENCOUNTER — Other Ambulatory Visit: Payer: Self-pay

## 2024-12-12 DIAGNOSIS — Z760 Encounter for issue of repeat prescription: Secondary | ICD-10-CM | POA: Insufficient documentation

## 2024-12-12 DIAGNOSIS — R21 Rash and other nonspecific skin eruption: Secondary | ICD-10-CM | POA: Insufficient documentation

## 2024-12-12 MED ORDER — RISPERIDONE 1 MG PO TABS
1.0000 mg | ORAL_TABLET | Freq: Once | ORAL | Status: AC
  Administered 2024-12-12: 1 mg via ORAL
  Filled 2024-12-12 (×2): qty 1

## 2024-12-12 MED ORDER — BACITRACIN ZINC 500 UNIT/GM EX OINT: TOPICAL_OINTMENT | Freq: Two times a day (BID) | CUTANEOUS | Status: DC

## 2024-12-12 NOTE — ED Triage Notes (Signed)
 Quick triage note: pt here requesting his Risperdal 

## 2024-12-12 NOTE — ED Provider Notes (Signed)
 "  EMERGENCY DEPARTMENT AT [facility] Provider Note   CSN: [id number] Arrival date & time: 12/12/24  2200     Patient presents with: Medication Refill   Cole Barrett is a 39 y.o. male.  Patient presents to the emergency room complaining of needing his Risperdal .  He comes frequently for the same medication.  He is also requesting some bacitracin .  He states he has a chronic rash on the back of his neck.    Medication Refill      Prior to Admission medications  Medication Sig Start Date End Date Taking? Authorizing Provider  bacitracin  ointment Apply 1 Application topically 2 (two) times daily. 11/02/24   Ula Prentice SAUNDERS, MD  risperiDONE  (RISPERDAL ) 1 MG tablet Take 1 tablet (1 mg total) by mouth 2 (two) times daily. 09/17/24 10/17/24  Jerrol Agent, MD  risperiDONE  (RISPERDAL ) 1 MG tablet Take 1 tablet (1 mg total) by mouth at bedtime. 11/02/24   Ula Prentice SAUNDERS, MD  amantadine  (SYMMETREL ) 100 MG capsule Take 1 capsule (100 mg total) by mouth 2 (two) times daily. Patient not taking: Reported on 11/04/2019 09/01/19 11/17/19  [name], NP    Allergies: Patient has no known allergies.    Review of Systems  Updated Vital Signs BP 136/82 (BP Location: Right Arm)   Pulse 78   Temp 97.8 F (36.6 C) (Oral)   Resp 16   SpO2 97%   Physical Exam Vitals and nursing note reviewed.  Constitutional:      Appearance: He is well-developed.  HENT:     Head: Normocephalic and atraumatic.  Eyes:     Conjunctiva/sclera: Conjunctivae normal.     Pupils: Pupils are equal, round, and reactive to light.  Neck:     Comments: Chronic appearing rash and skin dryness to left side of neck, no open wounds or drainage, no erythema  Cardiovascular:     Rate and Rhythm: Normal rate and regular rhythm.     Heart sounds: Normal heart sounds.  Pulmonary:     Effort: Pulmonary effort is normal.     Breath sounds: Normal breath sounds.  Abdominal:     General: Bowel  sounds are normal.     Palpations: Abdomen is soft.  Musculoskeletal:        General: Normal range of motion.     Cervical back: Normal range of motion.  Skin:    General: Skin is warm and dry.  Neurological:     Mental Status: He is alert and oriented to person, place, and time.     (all labs ordered are listed, but only abnormal results are displayed) Labs Reviewed - No data to display  EKG: None  Radiology: No results found.   Procedures   Medications Ordered in the ED  risperiDONE  (RISPERDAL ) tablet 1 mg (has no administration in time range)  bacitracin  ointment ( Topical Given 12/12/24 2250)                                    Medical Decision Making Risk OTC drugs. Prescription drug management.   39 year old male patient requesting Risperdal .  This is his 50th visit in the past 6 months.  He comes almost exclusively for Risperdal  refills.  The patient appears to be at his current baseline with normal vitals.  He did request another dose of bacitracin  for his chronic neck rash.  I do not  see signs of infection in relationship to the rash at this time.  He has no other current complaints.  Patient is stable for discharge at this time.     Final diagnoses:  Medication refill  Rash    ED Discharge Orders     None          Logan Ubaldo KATHEE DEVONNA 12/12/24 2304    Bari Charmaine FALCON, MD 12/14/24 [phone]  "

## 2024-12-12 NOTE — Discharge Instructions (Signed)
 You may use bacitracin  ointment over your neck rash up to twice daily.  You should seek follow-up with primary care or behavioral health for Risperdal  refills.  Return to the emergency department if you develop any life-threatening symptoms.

## 2024-12-12 NOTE — ED Triage Notes (Signed)
 States he needs some bacitracin  Risperdal .

## 2024-12-15 ENCOUNTER — Other Ambulatory Visit: Payer: Self-pay

## 2024-12-15 ENCOUNTER — Encounter (HOSPITAL_COMMUNITY): Payer: Self-pay

## 2024-12-15 ENCOUNTER — Emergency Department (HOSPITAL_COMMUNITY)
Admission: EM | Admit: 2024-12-15 | Discharge: 2024-12-15 | Disposition: A | Discharge status: Home / Self Care | Payer: Self-pay | Attending: Emergency Medicine | Admitting: Emergency Medicine

## 2024-12-15 DIAGNOSIS — Z760 Encounter for issue of repeat prescription: Secondary | ICD-10-CM | POA: Insufficient documentation

## 2024-12-15 MED ORDER — RISPERIDONE 0.5 MG PO TABS
1.0000 mg | ORAL_TABLET | Freq: Once | ORAL | Status: AC
  Administered 2024-12-15: 1 mg via ORAL
  Filled 2024-12-15: qty 2

## 2024-12-15 MED ORDER — BACITRACIN ZINC 500 UNIT/GM EX OINT
TOPICAL_OINTMENT | Freq: Two times a day (BID) | CUTANEOUS | Status: DC
  Filled 2024-12-15: qty 0.9

## 2024-12-15 NOTE — ED Triage Notes (Signed)
 Seeking daily dose of Risperdal  and a packet of bacitracin .

## 2024-12-15 NOTE — ED Provider Notes (Signed)
" °  [place] EMERGENCY DEPARTMENT AT [facility] Provider Note   CSN: [id number] Arrival date & time: 12/15/24  0044     Patient presents with: Medication Refill   Cole Barrett is a 39 y.o. male.   Patient to ED requesting dose of Risperdal . Frequent visits to ED for same. No new symptoms.   The history is provided by the patient. No language interpreter was used.  Medication Refill      Prior to Admission medications  Medication Sig Start Date End Date Taking? Authorizing Provider  bacitracin  ointment Apply 1 Application topically 2 (two) times daily. 11/02/24   Ula Prentice SAUNDERS, MD  risperiDONE  (RISPERDAL ) 1 MG tablet Take 1 tablet (1 mg total) by mouth 2 (two) times daily. 09/17/24 10/17/24  Jerrol Agent, MD  risperiDONE  (RISPERDAL ) 1 MG tablet Take 1 tablet (1 mg total) by mouth at bedtime. 11/02/24   Ula Prentice SAUNDERS, MD  amantadine  (SYMMETREL ) 100 MG capsule Take 1 capsule (100 mg total) by mouth 2 (two) times daily. Patient not taking: Reported on 11/04/2019 09/01/19 11/17/19  [name], NP    Allergies: Patient has no known allergies.    Review of Systems  Updated Vital Signs BP (!) 141/90   Pulse 98   Temp 98.6 F (37 C) (Oral)   Resp 18   SpO2 98%   Physical Exam Vitals and nursing note reviewed.  Constitutional:      Appearance: Normal appearance. He is well-developed.  Pulmonary:     Effort: Pulmonary effort is normal.  Musculoskeletal:        General: Normal range of motion.     Cervical back: Normal range of motion.  Skin:    General: Skin is warm and dry.  Neurological:     Mental Status: He is alert and oriented to person, place, and time.  Psychiatric:        Mood and Affect: Mood normal.        Speech: Speech normal.        Behavior: Behavior is cooperative.        Thought Content: Thought content normal.     (all labs ordered are listed, but only abnormal results are displayed) Labs Reviewed - No data to  display  EKG: None  Radiology: No results found.   Procedures   Medications Ordered in the ED  bacitracin  ointment (has no administration in time range)  risperiDONE  (RISPERDAL ) tablet 1 mg (1 mg Oral Given 12/15/24 0534)    Clinical Course as of 12/15/24 0549  Sun Dec 15, 2024  0547 Patient to ED requesting medications. Well known to the ED. He feels he is mentally stable and has no new concerns. REquesting bacitracin  to treat an abrasion on the arm. Risperdal  and bacitracin  provided.  [SU]    Clinical Course User Index [SU] Odell Balls, PA-C                                 Medical Decision Making Risk OTC drugs. Prescription drug management.        Final diagnoses:  Encounter for medication refill    ED Discharge Orders     None          Odell Balls, DEVONNA 12/15/24 0549    Raford Lenis, MD 12/15/24 [phone]  "

## 2024-12-15 NOTE — Discharge Instructions (Signed)
 Follow up with your doctor for routine management with Risperdal . Take care, [place]. See you next time.

## 2024-12-16 ENCOUNTER — Other Ambulatory Visit: Payer: Self-pay

## 2024-12-16 ENCOUNTER — Emergency Department (HOSPITAL_COMMUNITY)
Admission: EM | Admit: 2024-12-16 | Discharge: 2024-12-16 | Discharge status: Against Medical Advice | Payer: Self-pay | Attending: Emergency Medicine | Admitting: Emergency Medicine

## 2024-12-16 ENCOUNTER — Encounter (HOSPITAL_COMMUNITY): Payer: Self-pay

## 2024-12-16 DIAGNOSIS — Z760 Encounter for issue of repeat prescription: Secondary | ICD-10-CM | POA: Insufficient documentation

## 2024-12-16 DIAGNOSIS — Z5321 Procedure and treatment not carried out due to patient leaving prior to being seen by health care provider: Secondary | ICD-10-CM | POA: Insufficient documentation

## 2024-12-16 NOTE — ED Provider Notes (Signed)
 Patient not in room on my evaluation.   Levander Houston, MD 12/16/24 [phone]

## 2024-12-16 NOTE — ED Triage Notes (Signed)
 Patient here for Risperdal  and antibiotic ointment.

## 2024-12-21 ENCOUNTER — Other Ambulatory Visit: Payer: Self-pay

## 2024-12-21 ENCOUNTER — Emergency Department (HOSPITAL_COMMUNITY)
Admission: EM | Admit: 2024-12-21 | Discharge: 2024-12-21 | Disposition: A | Discharge status: Home / Self Care | Payer: Self-pay | Attending: Emergency Medicine | Admitting: Emergency Medicine

## 2024-12-21 ENCOUNTER — Encounter (HOSPITAL_COMMUNITY): Payer: Self-pay

## 2024-12-21 DIAGNOSIS — Z760 Encounter for issue of repeat prescription: Secondary | ICD-10-CM | POA: Insufficient documentation

## 2024-12-21 MED ORDER — RISPERIDONE 1 MG PO TABS
1.0000 mg | ORAL_TABLET | Freq: Once | ORAL | Status: AC
  Administered 2024-12-21: 1 mg via ORAL
  Filled 2024-12-21: qty 1

## 2024-12-21 NOTE — ED Triage Notes (Signed)
 Patient is here for his risperidone  and antibiotic ointment. No other complaints at this time

## 2024-12-23 NOTE — ED Provider Notes (Signed)
" °  [place] EMERGENCY DEPARTMENT AT [facility] Provider Note   CSN: [id number] Arrival date & time: 12/21/24  9177     Patient presents with: Medication Refill   Cole Barrett is a 39 y.o. male.    Medication Refill  Patient here for his risperidone        Prior to Admission medications  Medication Sig Start Date End Date Taking? Authorizing Provider  bacitracin  ointment Apply 1 Application topically 2 (two) times daily. 11/02/24   Ula Prentice SAUNDERS, MD  risperiDONE  (RISPERDAL ) 1 MG tablet Take 1 tablet (1 mg total) by mouth 2 (two) times daily. 09/17/24 10/17/24  Jerrol Agent, MD  risperiDONE  (RISPERDAL ) 1 MG tablet Take 1 tablet (1 mg total) by mouth at bedtime. 11/02/24   Ula Prentice SAUNDERS, MD  amantadine  (SYMMETREL ) 100 MG capsule Take 1 capsule (100 mg total) by mouth 2 (two) times daily. Patient not taking: Reported on 11/04/2019 09/01/19 11/17/19  [name], NP    Allergies: Patient has no known allergies.    Review of Systems  Updated Vital Signs BP 108/79   Pulse 79   Temp 98.4 F (36.9 C) (Temporal)   Resp 18   Ht 5' 10 (1.778 m)   Wt 100 kg   SpO2 97%   BMI 31.63 kg/m   Physical Exam Vitals and nursing note reviewed.  Constitutional:      General: He is not in acute distress.    Appearance: Normal appearance. He is not ill-appearing.  HENT:     Head: Normocephalic and atraumatic.  Eyes:     General: No scleral icterus.       Right eye: No discharge.        Left eye: No discharge.     Conjunctiva/sclera: Conjunctivae normal.  Pulmonary:     Effort: Pulmonary effort is normal.     Breath sounds: No stridor.  Neurological:     Mental Status: He is alert and oriented to person, place, and time. Mental status is at baseline.     (all labs ordered are listed, but only abnormal results are displayed) Labs Reviewed - No data to display  EKG: None  Radiology: No results found.   Procedures   Medications Ordered in the  ED  risperiDONE  (RISPERDAL ) tablet 1 mg (1 mg Oral Given 12/21/24 1131)                                    Medical Decision Making Risk Prescription drug management.   Patient here for his risperidone    NML vitals   Appears sleepy but well.   Final diagnoses:  Medication refill    ED Discharge Orders     None          Neldon Hamp RAMAN, [place] 12/23/24 1002  "

## 2024-12-26 ENCOUNTER — Other Ambulatory Visit: Payer: Self-pay

## 2024-12-26 ENCOUNTER — Emergency Department (HOSPITAL_COMMUNITY)
Admission: EM | Admit: 2024-12-26 | Discharge: 2024-12-27 | Disposition: A | Discharge status: Home / Self Care | Payer: Self-pay | Attending: Emergency Medicine | Admitting: Emergency Medicine

## 2024-12-26 ENCOUNTER — Encounter (HOSPITAL_COMMUNITY): Payer: Self-pay

## 2024-12-26 DIAGNOSIS — Z760 Encounter for issue of repeat prescription: Secondary | ICD-10-CM | POA: Insufficient documentation

## 2024-12-26 DIAGNOSIS — F17210 Nicotine dependence, cigarettes, uncomplicated: Secondary | ICD-10-CM | POA: Insufficient documentation

## 2024-12-26 DIAGNOSIS — Z8616 Personal history of COVID-19: Secondary | ICD-10-CM | POA: Insufficient documentation

## 2024-12-26 NOTE — ED Triage Notes (Signed)
 Patient is here for his risperidone  and antibiotic ointment. No other complaints at this time

## 2024-12-27 MED ORDER — RISPERIDONE 1 MG PO TABS
1.0000 mg | ORAL_TABLET | Freq: Once | ORAL | Status: AC
  Administered 2024-12-27: 1 mg via ORAL
  Filled 2024-12-27 (×2): qty 1

## 2024-12-27 MED ORDER — BACITRACIN ZINC 500 UNIT/GM EX OINT
TOPICAL_OINTMENT | Freq: Two times a day (BID) | CUTANEOUS | Status: DC
  Administered 2024-12-27: 1 via TOPICAL
  Filled 2024-12-27: qty 0.9

## 2024-12-27 NOTE — Discharge Instructions (Signed)
You were evaluated in the Emergency Department and after careful evaluation, we did not find any emergent condition requiring admission or further testing in the hospital.  Your exam/testing today was overall reassuring.  Please return to the Emergency Department if you experience any worsening of your condition.  Thank you for allowing us to be a part of your care.  

## 2024-12-27 NOTE — ED Provider Notes (Signed)
 " [facility] DEPT [facility] Emergency Department Provider Note MRN:  [id number]  Arrival date & time: 12/27/24     Chief Complaint   Medication Refill   History of Present Illness   Cole Barrett is a 39 y.o. year-old male with a history of schizophrenia presenting to the ED with chief complaint of medication refill.  Patient feels normal, just wants his dose of Risperdal .  Also wants some bacitracin  for his skin area that is healing.  Review of Systems  A thorough review of systems was obtained and all systems are negative except as noted in the HPI and PMH.   Patient's Health History    Past Medical History:  Diagnosis Date   COVID-19    Hallucination    Homelessness    Hypercholesterolemia    Malingering    Mental disorder    Paranoid behavior (HCC)    PE (pulmonary embolism)    Schizophrenia (HCC)     History reviewed. No pertinent surgical history.  Family History  Problem Relation Age of Onset   Hypertension Mother     Social History   Socioeconomic History   Marital status: Single    Spouse name: Not on file   Number of children: Not on file   Years of education: Not on file   Highest education level: Not on file  Occupational History   Occupation: Employed    Comment: [employer]  Tobacco Use   Smoking status: Every Day    Current packs/day: 0.50    Types: Cigarettes   Smokeless tobacco: Never  Vaping Use   Vaping status: Never Used  Substance and Sexual Activity   Alcohol use: Not Currently    Comment: occ   Drug use: Not Currently    Types: Marijuana    Comment: Last used: 2 months ago UDS NA   Sexual activity: Never  Other Topics Concern   Not on file  Social History Narrative   ** Merged History Encounter **    Pt lives alone in [place].  He is employed.  He stated that he is followed by [facility], but that he has not been there in a while.   Social Drivers of Health   Tobacco Use: High Risk (12/26/2024)    Patient History    Smoking Tobacco Use: Every Day    Smokeless Tobacco Use: Never    Passive Exposure: Not on file  Financial Resource Strain: Not on file  Food Insecurity: Not on file  Transportation Needs: Not on file  Physical Activity: Not on file  Stress: Not on file  Social Connections: Not on file  Intimate Partner Violence: Not on file  Depression (PHQ2-9): Medium Risk (12/23/2021)   Depression (PHQ2-9)    PHQ-2 Score: 6  Alcohol Screen: Not on file  Housing: Not on file  Utilities: Not on file  Health Literacy: Not on file     Physical Exam   Vitals:   12/26/24 2337 12/27/24 0435  BP: (!) 144/98 133/89  Pulse: 71 91  Resp: 18 19  Temp: 97.6 F (36.4 C) 98 F (36.7 C)  SpO2: 100% 99%    CONSTITUTIONAL: Well-appearing, NAD NEURO/PSYCH:  Alert and oriented x 3, no focal deficits EYES:  eyes equal and reactive ENT/NECK:  no LAD, no JVD CARDIO: Regular rate, well-perfused, normal S1 and S2 PULM:  CTAB no wheezing or rhonchi GI/GU:  non-distended, non-tender MSK/SPINE:  No gross deformities, no edema SKIN:  no rash, atraumatic   *Additional and/or  pertinent findings included in MDM below  Diagnostic and Interventional Summary    EKG Interpretation Date/Time:    Ventricular Rate:    PR Interval:    QRS Duration:    QT Interval:    QTC Calculation:   R Axis:      Text Interpretation:         Labs Reviewed - No data to display  No orders to display    Medications  risperiDONE  (RISPERDAL ) tablet 1 mg (has no administration in time range)  bacitracin  ointment (has no administration in time range)     Procedures  /  Critical Care Procedures  ED Course and Medical Decision Making  Initial Impression and Ddx Multiple recent visits for the same, likely here for shelter.  No symptoms, vitals reassuring, in no acute distress.  Past medical/surgical history that increases complexity of ED encounter: Schizophrenia  Interpretation of  Diagnostics Laboratory and/or imaging options to aid in the diagnosis/care of the patient were considered.  After careful history and physical examination, it was determined that there was no indication for diagnostics at this time.  Patient Reassessment and Ultimate Disposition/Management     No emergent process plans for discharge.  Patient management required discussion with the following services or consulting groups:  None  Complexity of Problems Addressed Acute complicated illness or Injury  Additional Data Reviewed and Analyzed Further history obtained from: Prior labs/imaging results  Additional Factors Impacting ED Encounter Risk None  [name], MD [facility] Emergency Medicine [facility] [email or web address]  Final Clinical Impressions(s) / ED Diagnoses     ICD-10-CM   1. Medication refill  Z76.0       ED Discharge Orders     None        Discharge Instructions Discussed with and Provided to Patient:    Discharge Instructions      You were evaluated in the Emergency Department and after careful evaluation, we did not find any emergent condition requiring admission or further testing in the hospital.  Your exam/testing today was overall reassuring.  Please return to the Emergency Department if you experience any worsening of your condition.  Thank you for allowing us  to be a part of your care.       Theadore Ozell HERO, MD 12/27/24 [phone]  "
